# Patient Record
Sex: Male | Born: 1960 | Race: White | Hispanic: No | Smoking: Never smoker
Health system: Southern US, Community
[De-identification: ages and names within clinical notes are randomized; demographics above are authoritative.]

## PROBLEM LIST (undated history)

## (undated) DIAGNOSIS — K219 Gastro-esophageal reflux disease without esophagitis: Secondary | ICD-10-CM

## (undated) DIAGNOSIS — I2510 Atherosclerotic heart disease of native coronary artery without angina pectoris: Secondary | ICD-10-CM

## (undated) DIAGNOSIS — N182 Chronic kidney disease, stage 2 (mild): Secondary | ICD-10-CM

## (undated) DIAGNOSIS — R002 Palpitations: Secondary | ICD-10-CM

## (undated) DIAGNOSIS — G4700 Insomnia, unspecified: Secondary | ICD-10-CM

## (undated) DIAGNOSIS — A499 Bacterial infection, unspecified: Secondary | ICD-10-CM

## (undated) DIAGNOSIS — E1142 Type 2 diabetes mellitus with diabetic polyneuropathy: Secondary | ICD-10-CM

## (undated) DIAGNOSIS — I5042 Chronic combined systolic (congestive) and diastolic (congestive) heart failure: Secondary | ICD-10-CM

## (undated) DIAGNOSIS — E119 Type 2 diabetes mellitus without complications: Secondary | ICD-10-CM

## (undated) DIAGNOSIS — G7000 Myasthenia gravis without (acute) exacerbation: Secondary | ICD-10-CM

## (undated) DIAGNOSIS — R51 Headache: Secondary | ICD-10-CM

## (undated) DIAGNOSIS — H9192 Unspecified hearing loss, left ear: Secondary | ICD-10-CM

## (undated) DIAGNOSIS — I255 Ischemic cardiomyopathy: Secondary | ICD-10-CM

## (undated) DIAGNOSIS — D649 Anemia, unspecified: Secondary | ICD-10-CM

## (undated) DIAGNOSIS — H269 Unspecified cataract: Secondary | ICD-10-CM

## (undated) DIAGNOSIS — A6920 Lyme disease, unspecified: Secondary | ICD-10-CM

## (undated) DIAGNOSIS — T7840XA Allergy, unspecified, initial encounter: Secondary | ICD-10-CM

## (undated) DIAGNOSIS — R519 Headache, unspecified: Secondary | ICD-10-CM

## (undated) DIAGNOSIS — I10 Essential (primary) hypertension: Secondary | ICD-10-CM

## (undated) DIAGNOSIS — Z974 Presence of external hearing-aid: Secondary | ICD-10-CM

## (undated) DIAGNOSIS — A419 Sepsis, unspecified organism: Secondary | ICD-10-CM

## (undated) DIAGNOSIS — J302 Other seasonal allergic rhinitis: Secondary | ICD-10-CM

## (undated) DIAGNOSIS — I219 Acute myocardial infarction, unspecified: Secondary | ICD-10-CM

## (undated) DIAGNOSIS — M1990 Unspecified osteoarthritis, unspecified site: Secondary | ICD-10-CM

## (undated) DIAGNOSIS — E0590 Thyrotoxicosis, unspecified without thyrotoxic crisis or storm: Secondary | ICD-10-CM

## (undated) DIAGNOSIS — J45909 Unspecified asthma, uncomplicated: Secondary | ICD-10-CM

## (undated) DIAGNOSIS — E785 Hyperlipidemia, unspecified: Secondary | ICD-10-CM

## (undated) DIAGNOSIS — G4730 Sleep apnea, unspecified: Secondary | ICD-10-CM

## (undated) DIAGNOSIS — R509 Fever, unspecified: Secondary | ICD-10-CM

## (undated) HISTORY — DX: Type 2 diabetes mellitus with diabetic polyneuropathy: E11.42

## (undated) HISTORY — DX: Palpitations: R00.2

## (undated) HISTORY — DX: Unspecified osteoarthritis, unspecified site: M19.90

## (undated) HISTORY — DX: Unspecified cataract: H26.9

## (undated) HISTORY — DX: Bacterial infection, unspecified: A49.9

## (undated) HISTORY — DX: Unspecified asthma, uncomplicated: J45.909

## (undated) HISTORY — PX: TUNNELED VENOUS CATHETER PLACEMENT: SHX818

## (undated) HISTORY — DX: Type 2 diabetes mellitus without complications: E11.9

## (undated) HISTORY — DX: Lyme disease, unspecified: A69.20

## (undated) HISTORY — DX: Atherosclerotic heart disease of native coronary artery without angina pectoris: I25.10

## (undated) HISTORY — DX: Allergy, unspecified, initial encounter: T78.40XA

## (undated) HISTORY — PX: TONSILLECTOMY AND ADENOIDECTOMY: SUR1326

## (undated) HISTORY — DX: Hyperlipidemia, unspecified: E78.5

## (undated) HISTORY — DX: Myasthenia gravis without (acute) exacerbation: G70.00

## (undated) HISTORY — DX: Insomnia, unspecified: G47.00

## (undated) HISTORY — PX: BILATERAL CARPAL TUNNEL RELEASE: SHX6508

## (undated) HISTORY — DX: Anemia, unspecified: D64.9

## (undated) HISTORY — PX: CORONARY ANGIOPLASTY: SHX604

## (undated) HISTORY — DX: Chronic kidney disease, stage 2 (mild): N18.2

## (undated) HISTORY — DX: Gastro-esophageal reflux disease without esophagitis: K21.9

## (undated) HISTORY — PX: CARDIAC CATHETERIZATION: SHX172

## (undated) HISTORY — DX: Sepsis, unspecified organism: A41.9

## (undated) HISTORY — DX: Fever, unspecified: R50.9

## (undated) HISTORY — DX: Essential (primary) hypertension: I10

---

## 1992-06-05 DIAGNOSIS — A6920 Lyme disease, unspecified: Secondary | ICD-10-CM | POA: Insufficient documentation

## 2008-10-05 DIAGNOSIS — I219 Acute myocardial infarction, unspecified: Secondary | ICD-10-CM

## 2008-10-05 HISTORY — DX: Acute myocardial infarction, unspecified: I21.9

## 2010-10-05 HISTORY — PX: EYE SURGERY: SHX253

## 2015-08-22 ENCOUNTER — Encounter: Payer: Self-pay | Admitting: Family Medicine

## 2015-08-22 ENCOUNTER — Ambulatory Visit (INDEPENDENT_AMBULATORY_CARE_PROVIDER_SITE_OTHER): Payer: Medicare Other | Admitting: Family Medicine

## 2015-08-22 VITALS — BP 122/72 | HR 98 | Temp 98.1°F | Resp 18 | Ht 67.0 in | Wt 258.9 lb

## 2015-08-22 DIAGNOSIS — M545 Low back pain, unspecified: Secondary | ICD-10-CM | POA: Insufficient documentation

## 2015-08-22 DIAGNOSIS — E785 Hyperlipidemia, unspecified: Secondary | ICD-10-CM

## 2015-08-22 DIAGNOSIS — M1990 Unspecified osteoarthritis, unspecified site: Secondary | ICD-10-CM | POA: Diagnosis not present

## 2015-08-22 DIAGNOSIS — G7000 Myasthenia gravis without (acute) exacerbation: Secondary | ICD-10-CM | POA: Diagnosis not present

## 2015-08-22 DIAGNOSIS — M5442 Lumbago with sciatica, left side: Secondary | ICD-10-CM | POA: Diagnosis not present

## 2015-08-22 DIAGNOSIS — E118 Type 2 diabetes mellitus with unspecified complications: Secondary | ICD-10-CM

## 2015-08-22 DIAGNOSIS — G8929 Other chronic pain: Secondary | ICD-10-CM

## 2015-08-22 DIAGNOSIS — E1142 Type 2 diabetes mellitus with diabetic polyneuropathy: Secondary | ICD-10-CM | POA: Insufficient documentation

## 2015-08-22 DIAGNOSIS — E1165 Type 2 diabetes mellitus with hyperglycemia: Secondary | ICD-10-CM | POA: Diagnosis not present

## 2015-08-22 DIAGNOSIS — K219 Gastro-esophageal reflux disease without esophagitis: Secondary | ICD-10-CM | POA: Insufficient documentation

## 2015-08-22 DIAGNOSIS — J4550 Severe persistent asthma, uncomplicated: Secondary | ICD-10-CM

## 2015-08-22 DIAGNOSIS — I10 Essential (primary) hypertension: Secondary | ICD-10-CM | POA: Diagnosis not present

## 2015-08-22 DIAGNOSIS — IMO0002 Reserved for concepts with insufficient information to code with codable children: Secondary | ICD-10-CM | POA: Insufficient documentation

## 2015-08-22 DIAGNOSIS — A6920 Lyme disease, unspecified: Secondary | ICD-10-CM

## 2015-08-22 DIAGNOSIS — I2510 Atherosclerotic heart disease of native coronary artery without angina pectoris: Secondary | ICD-10-CM

## 2015-08-22 DIAGNOSIS — L309 Dermatitis, unspecified: Secondary | ICD-10-CM | POA: Diagnosis not present

## 2015-08-22 DIAGNOSIS — Z794 Long term (current) use of insulin: Secondary | ICD-10-CM

## 2015-08-22 DIAGNOSIS — G4700 Insomnia, unspecified: Secondary | ICD-10-CM

## 2015-08-22 DIAGNOSIS — J45909 Unspecified asthma, uncomplicated: Secondary | ICD-10-CM | POA: Insufficient documentation

## 2015-08-22 LAB — POCT GLYCOSYLATED HEMOGLOBIN (HGB A1C): HEMOGLOBIN A1C: 8.1

## 2015-08-22 LAB — GLUCOSE, POCT (MANUAL RESULT ENTRY): POC GLUCOSE: 99 mg/dL (ref 70–99)

## 2015-08-22 MED ORDER — BETAMETHASONE DIPROPIONATE AUG 0.05 % EX OINT: TOPICAL_OINTMENT | Freq: Two times a day (BID) | CUTANEOUS | Status: DC

## 2015-08-22 MED ORDER — HYDROCHLOROTHIAZIDE 25 MG PO TABS: 25.0000 mg | ORAL_TABLET | Freq: Every day | ORAL | Status: DC

## 2015-08-22 MED ORDER — GLUCOSE BLOOD VI STRP: 1.0000 | ORAL_STRIP | Status: DC | PRN

## 2015-08-22 NOTE — Progress Notes (Signed)
Name: [name]   MRN: [id number]    DOB: 1961/09/06   Date:08/22/2015       Progress Note  Subjective  Chief Complaint  Chief Complaint  Patient presents with  . Establish Care    NP  . Diabetes   Diabetes He presents for his follow-up diabetic visit. He has type 2 diabetes mellitus. His disease course has been stable. Hypoglycemia symptoms include dizziness. Pertinent negatives for hypoglycemia include no headaches. Associated symptoms include foot paresthesias and weakness. Pertinent negatives for diabetes include no blurred vision, no chest pain, no polydipsia and no polyuria. Diabetic complications include heart disease, impotence, peripheral neuropathy and retinopathy. Pertinent negatives for diabetic complications include no CVA. Current diabetic treatment includes oral agent (monotherapy) and intensive insulin program. He is currently taking insulin pre-breakfast, pre-lunch, at bedtime and pre-dinner. Insulin injections are given by patient. His weight is stable. He is following a generally healthy (Eats bread, working on improving his diet) diet. He monitors blood glucose at home 3-4 x per day. His breakfast blood glucose range is generally 110-130 mg/dl. An ACE inhibitor/angiotensin II receptor blocker is not being taken. Eye exam is current.  Hypertension This is a chronic problem. The problem is controlled. Associated symptoms include shortness of breath. Pertinent negatives include no anxiety, blurred vision, chest pain, headaches, orthopnea or palpitations. Past treatments include diuretics, calcium channel blockers and beta blockers. Hypertensive end-organ damage includes CAD/MI and retinopathy. There is no history of kidney disease or CVA.  Rash This is a chronic problem. The problem is unchanged. The affected locations include the left lower leg and right lowerleg. The rash is characterized by itchiness and scaling. Associated symptoms include joint pain and shortness of  breath. Past treatments include topical steroids. The treatment provided significant relief. His past medical history is significant for asthma.    Past Medical History  Diagnosis Date  . Asthma   . Diabetes mellitus without complication ([place])   . GERD (gastroesophageal reflux disease)   . Hyperlipidemia   . Hypertension   . Allergy     dust, seasonal (worse in the fall).  . Lyme disease     Chronic  . 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 Followed by Pain Specialist in [place]  . Cataract     First Dx in 2012  . Coronary artery disease     Has 10 stents.  . Myasthenia gravis ([place])   . Diabetic peripheral neuropathy ([place])   . Insomnia     Past Surgical History  Procedure Laterality Date  . Cardiac catheterization      Several Caths, most recent in  March 2016.  [name] Eye surgery Bilateral 2012    cataract  . Tonsillectomy and adenoidectomy      As a child  . Bilateral carpal tunnel release Bilateral L in 2012 and R in 2013    Family History  Problem Relation Age of Onset  . Diabetes Mother   . Heart disease Mother   . Cancer Father     Prostate CA    Social History   Social History  . Marital Status: Married    Spouse Name: N/A  . Number of Children: N/A  . Years of Education: N/A   Occupational History  . Not on file.   Social History Main Topics  . Smoking status: Never Smoker   . Smokeless tobacco: Never Used  . Alcohol Use: 0.0 oz/week    0 Standard drinks or equivalent per week  Comment: occasional  . Drug Use: No  . Sexual Activity: No   Other Topics Concern  . Not on file   Social History Narrative  . No narrative on file     Current outpatient prescriptions:  .  albuterol (PROVENTIL) (2.5 MG/3ML) 0.083% nebulizer solution, Take 2.5 mg by nebulization every 6 (six) hours as needed for wheezing or shortness of breath., Disp: , Rfl:  .  AMLODIPINE BESYLATE PO, Take 10 mg by mouth daily., Disp: , Rfl:  .  aspirin 81 MG tablet, Take 81 mg by mouth  daily., Disp: , Rfl:  .  augmented betamethasone dipropionate (DIPROLENE-AF) 0.05 % ointment, Apply topically 2 (two) times daily., Disp: , Rfl:  .  bisoprolol-hydrochlorothiazide (ZIAC) 2.5-6.25 MG tablet, Take 1 tablet by mouth daily., Disp: , Rfl:  .  cyclobenzaprine (FLEXERIL) 10 MG tablet, Take 10 mg by mouth 3 (three) times daily as needed for muscle spasms., Disp: , Rfl:  .  fluticasone (FLOVENT HFA) 110 MCG/ACT inhaler, Inhale 2 puffs into the lungs 2 (two) times daily., Disp: , Rfl:  .  gabapentin (NEURONTIN) 300 MG capsule, Take 300 mg by mouth 3 (three) times daily., Disp: , Rfl:  .  gabapentin (NEURONTIN) 800 MG tablet, Take 800 mg by mouth at bedtime., Disp: , Rfl:  .  glucose blood test strip, 1 each by Other route as needed for other. Use as instructed, Disp: , Rfl:  .  hydrochlorothiazide (HYDRODIURIL) 25 MG tablet, Take 25 mg by mouth daily., Disp: , Rfl:  .  insulin glargine (LANTUS) 100 UNIT/ML injection, Inject 42 Units into the skin at bedtime., Disp: , Rfl:  .  insulin lispro (HUMALOG KWIKPEN) 100 UNIT/ML KiwkPen, Inject 100 Units into the skin., Disp: , Rfl:  .  Loratadine (CLARITIN PO), Take by mouth., Disp: , Rfl:  .  METFORMIN HCL PO, Take 500 mg by mouth 2 (two) times daily., Disp: , Rfl:  .  mometasone-formoterol (DULERA) 100-5 MCG/ACT AERO, Inhale 2 puffs into the lungs 2 (two) times daily., Disp: , Rfl:  .  nitroGLYCERIN (NITROSTAT) 0.3 MG SL tablet, Place 0.3 mg under the tongue every 5 (five) minutes as needed for chest pain., Disp: , Rfl:  .  Oxycodone HCl 10 MG TABS, Take 10 mg by mouth 4 (four) times daily as needed., Disp: , Rfl:  .  pantoprazole (PROTONIX) 20 MG tablet, Take 20 mg by mouth daily., Disp: , Rfl:  .  pyridostigmine (MESTINON) 60 MG tablet, Take 60 mg by mouth 4 (four) times daily as needed., Disp: , Rfl:  .  rosuvastatin (CRESTOR) 10 MG tablet, Take 10 mg by mouth daily., Disp: , Rfl:  .  zolpidem (AMBIEN) 5 MG tablet, Take 10 mg by mouth at  bedtime as needed for sleep., Disp: , Rfl:   No Known Allergies   Review of Systems  Eyes: Negative for blurred vision.  Respiratory: Positive for shortness of breath.   Cardiovascular: Negative for chest pain, palpitations and orthopnea.  Genitourinary: Positive for impotence.  Musculoskeletal: Positive for back pain and joint pain.  Skin: Positive for rash.  Neurological: Positive for dizziness and weakness. Negative for headaches.  Endo/Heme/Allergies: Negative for polydipsia.   Objective  Filed Vitals:   08/22/15 1457  BP: 122/72  Pulse: 98  Temp: 98.1 F (36.7 C)  TempSrc: Oral  Resp: 18  Height: 5\' 7"  (1.702 m)  Weight: 258 lb 14.4 oz (117.436 kg)  SpO2: 95%    Physical Exam  Constitutional: He  is oriented to person, place, and time and well-developed, well-nourished, and in no distress.  HENT:  Head: Normocephalic and atraumatic.  Eyes: Pupils are equal, round, and reactive to light.  Pupils equal, round, but sluggishly reactive to light  Cardiovascular: S1 normal, S2 normal and normal heart sounds.   Sinus tachycardia.  Pulmonary/Chest: He has no decreased breath sounds. He has wheezes. He has no rhonchi. He has no rales.  Diffuse expiratory and inspiratory wheezing on auscultation, no crackles.  Abdominal: Soft. Bowel sounds are normal. There is no tenderness.  Neurological: He is alert and oriented to person, place, and time.  Nursing note and vitals reviewed.   Recent Results (from the past 2160 hour(s))  POCT Glucose (CBG)     Status: Normal   Collection Time: 08/22/15  3:27 PM  Result Value Ref Range   POC Glucose 99 70 - 99 mg/dl  POCT HgB A1C     Status: Abnormal   Collection Time: 08/22/15  3:33 PM  Result Value Ref Range   Hemoglobin A1C 8.1     Assessment & Plan 1. Coronary artery disease involving native coronary artery of native heart without angina pectoris  - Ambulatory referral to Cardiology  2. Uncontrolled type 2 diabetes mellitus  with complication, with long-term current use of insulin (HCC) A1c of 8.1%, consistent with poor control. Referral to endocrinology for further management of diabetes - POCT HgB A1C - POCT Glucose (CBG) - Ambulatory referral to Ophthalmology - Ambulatory referral to Endocrinology - glucose blood test strip; 1 each by Other route as needed for other. Use as instructed  Dispense: 360 each; Refill: 0  3. Myasthenia gravis ([place])  - Ambulatory referral to Neurology  4. Chronic left-sided low back pain with left-sided sciatica chronic low back pain, on oxycodone 10 mg 4 times daily.referral to pain clinic for continuation of therapy. - Ambulatory referral to Pain Clinic  5. Dermatitis  - augmented betamethasone dipropionate (DIPROLENE-AF) 0.05 % ointment; Apply topically 2 (two) times daily.  Dispense: 45 g; Refill: 0  6. Essential hypertension Stable and controlled. - hydrochlorothiazide (HYDRODIURIL) 25 MG tablet; Take 1 tablet (25 mg total) by mouth daily.  Dispense: 30 tablet; Refill: 0   Merrill Villarruel [facility] 08/22/2015 3:42 PM

## 2015-08-26 ENCOUNTER — Telehealth: Payer: Self-pay | Admitting: Family Medicine

## 2015-08-26 DIAGNOSIS — E1165 Type 2 diabetes mellitus with hyperglycemia: Secondary | ICD-10-CM

## 2015-08-26 DIAGNOSIS — E118 Type 2 diabetes mellitus with unspecified complications: Principal | ICD-10-CM

## 2015-08-26 DIAGNOSIS — Z794 Long term (current) use of insulin: Principal | ICD-10-CM

## 2015-08-26 DIAGNOSIS — IMO0002 Reserved for concepts with insufficient information to code with codable children: Secondary | ICD-10-CM

## 2015-08-26 NOTE — Telephone Encounter (Signed)
Patient test scripts where called in however the pharmacy is needing the type meter that he uses. The patient called and stated that he uses [employer] Next. He also states that he only have 1.5 days left of test scripts. Please send to [facility]. He is also requesting a return call once complete.

## 2015-08-27 ENCOUNTER — Telehealth: Payer: Self-pay | Admitting: Family Medicine

## 2015-08-27 MED ORDER — GLUCOSE BLOOD VI STRP: ORAL_STRIP | Status: DC

## 2015-08-27 MED ORDER — GLUCOSE BLOOD VI STRP: 1.0000 | ORAL_STRIP | Status: DC | PRN

## 2015-08-27 NOTE — Telephone Encounter (Signed)
Test strips have been sent to [place]

## 2015-08-27 NOTE — Telephone Encounter (Signed)
Pt needs test strips sent to [facility]. Pt states pharmacy needs the name of the test strips he needs for his machine. He states this needs to be done as soon as possible for he will be out by Wednesday afternoon. The type of meter he has [employer] Cont[employer] Next strips.Medicare will not fill it without a name.

## 2015-08-27 NOTE — Telephone Encounter (Signed)
Test Strips have been refilled and sent to [place]

## 2015-08-28 ENCOUNTER — Telehealth: Payer: Self-pay

## 2015-08-28 MED ORDER — GLUCOSE BLOOD VI STRP: ORAL_STRIP | Status: DC

## 2015-08-28 NOTE — Telephone Encounter (Signed)
Medication has been refilled as directed by the Pharmacy with the proper Diagnosis codes.

## 2015-09-02 ENCOUNTER — Telehealth: Payer: Self-pay | Admitting: Family Medicine

## 2015-09-02 NOTE — Telephone Encounter (Signed)
Patient is requesting a refill on Metformin 500 mg 2 times daily and Pantoprazole Sodium 20mg  1 tablet 3 times daily. Please send to [facility] Also, stated that the Contour test strips is costing $122.00. In order for it to be less expensive it has to be written on the prescription that he is uncontrolled diabetic. Please call wife Neoma Laming once completed [phone]

## 2015-09-03 MED ORDER — PANTOPRAZOLE SODIUM 20 MG PO TBEC: 20.0000 mg | DELAYED_RELEASE_TABLET | Freq: Every day | ORAL | Status: DC

## 2015-09-03 MED ORDER — METFORMIN HCL 500 MG PO TABS: 500.0000 mg | ORAL_TABLET | Freq: Two times a day (BID) | ORAL | Status: DC

## 2015-09-03 MED ORDER — GLUCOSE BLOOD VI STRP: ORAL_STRIP | Status: DC

## 2015-09-03 NOTE — Telephone Encounter (Signed)
Medication has been refilled and sent to [facility] [address] 

## 2015-09-04 ENCOUNTER — Telehealth: Payer: Self-pay | Admitting: Family Medicine

## 2015-09-04 NOTE — Telephone Encounter (Signed)
Routed to Dr. Manuella Ghazi for medication dosage change

## 2015-09-04 NOTE — Telephone Encounter (Signed)
Pt would like a call back about one of his meds. Pt is at home and its ok to call that #.

## 2015-09-04 NOTE — Telephone Encounter (Signed)
Patient normally takes Protonix 20 mg 3 times daily. In his medication list, Protonix is listed as 20 mg once daily. He is going to bring the medication bottle on Friday, December 2 for [place] to review and change the prescription.

## 2015-09-18 ENCOUNTER — Other Ambulatory Visit: Payer: Self-pay

## 2015-09-18 ENCOUNTER — Ambulatory Visit: Payer: Medicare Other | Admitting: Family Medicine

## 2015-09-18 MED ORDER — PANTOPRAZOLE SODIUM 40 MG PO TBEC: 40.0000 mg | DELAYED_RELEASE_TABLET | Freq: Two times a day (BID) | ORAL | Status: DC

## 2015-09-18 NOTE — Telephone Encounter (Signed)
Medication has been refilled and sent to [facility] on [address].

## 2015-09-25 ENCOUNTER — Telehealth: Payer: Self-pay | Admitting: Family Medicine

## 2015-09-25 MED ORDER — GABAPENTIN 800 MG PO TABS: 800.0000 mg | ORAL_TABLET | Freq: Every day | ORAL | Status: DC

## 2015-09-25 MED ORDER — GABAPENTIN 300 MG PO CAPS: 300.0000 mg | ORAL_CAPSULE | Freq: Three times a day (TID) | ORAL | Status: AC

## 2015-09-25 NOTE — Telephone Encounter (Signed)
Medication has been refilled and sent to [facility] [address] 

## 2015-09-25 NOTE — Telephone Encounter (Signed)
Pt would like a refill on Gabapentin 2 different doses, 300 mg capsule 1 tab 3 times a day, 800 mg 1 tab at bedtime. [place]

## 2015-10-08 ENCOUNTER — Encounter: Payer: Self-pay | Admitting: Family Medicine

## 2015-10-08 ENCOUNTER — Ambulatory Visit (INDEPENDENT_AMBULATORY_CARE_PROVIDER_SITE_OTHER): Payer: Medicare Other | Admitting: Family Medicine

## 2015-10-08 VITALS — BP 121/80 | HR 106 | Temp 98.1°F | Resp 19 | Ht 67.0 in | Wt 257.9 lb

## 2015-10-08 DIAGNOSIS — G4700 Insomnia, unspecified: Secondary | ICD-10-CM | POA: Diagnosis not present

## 2015-10-08 DIAGNOSIS — K219 Gastro-esophageal reflux disease without esophagitis: Secondary | ICD-10-CM | POA: Diagnosis not present

## 2015-10-08 DIAGNOSIS — J45901 Unspecified asthma with (acute) exacerbation: Secondary | ICD-10-CM | POA: Insufficient documentation

## 2015-10-08 DIAGNOSIS — I10 Essential (primary) hypertension: Secondary | ICD-10-CM | POA: Diagnosis not present

## 2015-10-08 DIAGNOSIS — J4541 Moderate persistent asthma with (acute) exacerbation: Secondary | ICD-10-CM

## 2015-10-08 MED ORDER — AMLODIPINE BESYLATE 5 MG PO TABS: 5.0000 mg | ORAL_TABLET | Freq: Every day | ORAL | Status: DC

## 2015-10-08 MED ORDER — AZITHROMYCIN 250 MG PO TABS: ORAL_TABLET | ORAL | Status: DC

## 2015-10-08 MED ORDER — ZOLPIDEM TARTRATE 5 MG PO TABS: 10.0000 mg | ORAL_TABLET | Freq: Every evening | ORAL | Status: DC | PRN

## 2015-10-08 MED ORDER — PREDNISONE 10 MG PO TABS: 10.0000 mg | ORAL_TABLET | Freq: Every day | ORAL | Status: DC

## 2015-10-08 MED ORDER — PANTOPRAZOLE SODIUM 40 MG PO TBEC: 40.0000 mg | DELAYED_RELEASE_TABLET | Freq: Two times a day (BID) | ORAL | Status: DC

## 2015-10-08 NOTE — Progress Notes (Signed)
Name: [name]   MRN: [id number]    DOB: May 21, 1961   Date:10/08/2015       Progress Note  Subjective  Chief Complaint  Chief Complaint  Patient presents with  . Follow-up    1 mo  . Diabetes  . Hypertension  . Hyperlipidemia  . Asthma  . Medication Refill    amlodpine 5 mg / pantoprazole 40 mg / zolpidem 5 mg    Hypertension This is a chronic problem. The problem is unchanged. The problem is controlled. Associated symptoms include headaches (migraine headaches). Pertinent negatives include no blurred vision, chest pain or palpitations. Past treatments include calcium channel blockers, diuretics and beta blockers. Hypertensive end-organ damage includes CAD/MI (CAD w/ stents, MI). There is no history of kidney disease or CVA.  Insomnia Primary symptoms: sleep disturbance.  The symptoms are aggravated by pain (neuropathy in feet). Past treatments include medication. Typical bedtime:  10-11 P.M..  How long after going to bed to you fall asleep: 15-30 minutes.    Heartburn He complains of dysphagia (most likely from Myasthenia Gravis), heartburn and nausea. He reports no abdominal pain, no belching, no chest pain, no coughing or no sore throat. This is a chronic problem. Risk factors include obesity. He has tried a PPI for the symptoms. Past procedures do not include an EGD.  Cough This is a new problem. The cough is productive of sputum. Associated symptoms include ear pain, headaches (migraine headaches) and heartburn. Pertinent negatives include no chest pain, chills, ear congestion, fever or sore throat. He has tried a beta-agonist inhaler for the symptoms. His past medical history is significant for asthma and bronchitis.    Past Medical History  Diagnosis Date  . Asthma   . Diabetes mellitus without complication ([place])   . GERD (gastroesophageal reflux disease)   . Hyperlipidemia   . Hypertension   . Allergy     dust, seasonal (worse in the fall).  . Lyme disease    Chronic  . 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 Followed by Pain Specialist in [place]  . Cataract     First Dx in 2012  . Coronary artery disease     Has 10 stents.  . Myasthenia gravis ([place])   . Diabetic peripheral neuropathy ([place])   . Insomnia     Past Surgical History  Procedure Laterality Date  . Cardiac catheterization      Several Caths, most recent in  March 2016.  [name] Eye surgery Bilateral 2012    cataract  . Tonsillectomy and adenoidectomy      As a child  . Bilateral carpal tunnel release Bilateral L in 2012 and R in 2013    Family History  Problem Relation Age of Onset  . Diabetes Mother   . Heart disease Mother   . Cancer Father     Prostate CA    Social History   Social History  . Marital Status: Married    Spouse Name: N/A  . Number of Children: N/A  . Years of Education: N/A   Occupational History  . Not on file.   Social History Main Topics  . Smoking status: Never Smoker   . Smokeless tobacco: Never Used  . Alcohol Use: 0.0 oz/week    0 Standard drinks or equivalent per week     Comment: occasional  . Drug Use: No  . Sexual Activity: No   Other Topics Concern  . Not on file   Social History Narrative  Current outpatient prescriptions:  .  albuterol (PROVENTIL) (2.5 MG/3ML) 0.083% nebulizer solution, Take 2.5 mg by nebulization every 6 (six) hours as needed for wheezing or shortness of breath., Disp: , Rfl:  .  amLODipine (NORVASC) 5 MG tablet, Take 1 tablet (5 mg total) by mouth daily., Disp: 30 tablet, Rfl: 2 .  aspirin 81 MG tablet, Take 81 mg by mouth daily., Disp: , Rfl:  .  augmented betamethasone dipropionate (DIPROLENE-AF) 0.05 % ointment, Apply topically 2 (two) times daily., Disp: 45 g, Rfl: 0 .  bisoprolol-hydrochlorothiazide (ZIAC) 2.5-6.25 MG tablet, Take 1 tablet by mouth daily., Disp: , Rfl:  .  cyclobenzaprine (FLEXERIL) 10 MG tablet, Take 10 mg by mouth 3 (three) times daily as needed for muscle spasms., Disp: , Rfl:  .   fluticasone (FLOVENT HFA) 110 MCG/ACT inhaler, Inhale 2 puffs into the lungs 2 (two) times daily., Disp: , Rfl:  .  gabapentin (NEURONTIN) 300 MG capsule, Take 1 capsule (300 mg total) by mouth 3 (three) times daily., Disp: 90 capsule, Rfl: 1 .  gabapentin (NEURONTIN) 800 MG tablet, Take 1 tablet (800 mg total) by mouth at bedtime., Disp: 90 tablet, Rfl: 0 .  glucose blood (BAYER CONTOUR TEST) test strip, Use as instructed, Disp: 100 each, Rfl: 2 .  hydrochlorothiazide (HYDRODIURIL) 25 MG tablet, Take 1 tablet (25 mg total) by mouth daily., Disp: 30 tablet, Rfl: 0 .  insulin glargine (LANTUS) 100 UNIT/ML injection, Inject 42 Units into the skin at bedtime., Disp: , Rfl:  .  insulin lispro (HUMALOG KWIKPEN) 100 UNIT/ML KiwkPen, Inject 8 Units into the skin 3 (three) times daily. , Disp: , Rfl:  .  Loratadine (CLARITIN PO), Take by mouth., Disp: , Rfl:  .  metFORMIN (GLUCOPHAGE) 500 MG tablet, Take 1 tablet (500 mg total) by mouth 2 (two) times daily., Disp: 60 tablet, Rfl: 2 .  mometasone-formoterol (DULERA) 100-5 MCG/ACT AERO, Inhale 2 puffs into the lungs 2 (two) times daily., Disp: , Rfl:  .  nitroGLYCERIN (NITROSTAT) 0.3 MG SL tablet, Place 0.3 mg under the tongue every 5 (five) minutes as needed for chest pain., Disp: , Rfl:  .  Oxycodone HCl 10 MG TABS, Take 10 mg by mouth 4 (four) times daily as needed., Disp: , Rfl:  .  pantoprazole (PROTONIX) 40 MG tablet, Take 1 tablet (40 mg total) by mouth 2 (two) times daily., Disp: 60 tablet, Rfl: 2 .  pyridostigmine (MESTINON) 60 MG tablet, Take 60 mg by mouth 4 (four) times daily as needed., Disp: , Rfl:  .  rosuvastatin (CRESTOR) 10 MG tablet, Take 10 mg by mouth daily., Disp: , Rfl:  .  zolpidem (AMBIEN) 5 MG tablet, Take 2 tablets (10 mg total) by mouth at bedtime as needed for sleep., Disp: 30 tablet, Rfl: 2 .  azithromycin (ZITHROMAX) 250 MG tablet, 2 tabs po x day 1, then 1 tab po q day x 4 days, Disp: 6 tablet, Rfl: 0 .  predniSONE (DELTASONE)  10 MG tablet, Take 1 tablet (10 mg total) by mouth daily with breakfast., Disp: 15 tablet, Rfl: 0  Allergies  Allergen Reactions  . Insulin Aspart Hives     Review of Systems  Constitutional: Negative for fever and chills.  HENT: Positive for ear pain. Negative for sore throat.   Eyes: Negative for blurred vision.  Respiratory: Negative for cough.   Cardiovascular: Negative for chest pain and palpitations.  Gastrointestinal: Positive for heartburn, dysphagia (most likely from Myasthenia Gravis) and nausea. Negative for  abdominal pain.  Neurological: Positive for headaches (migraine headaches).  Psychiatric/Behavioral: Positive for sleep disturbance. The patient has insomnia.     Objective  Filed Vitals:   10/08/15 1341  BP: 121/80  Pulse: 106  Temp: 98.1 F (36.7 C)  TempSrc: Oral  Resp: 19  Height: 5\' 7"  (1.702 m)  Weight: 257 lb 14.4 oz (116.983 kg)  SpO2: 93%    Physical Exam  Constitutional: He is well-developed, well-nourished, and in no distress.  HENT:  Head: Normocephalic and atraumatic.  Cardiovascular: Normal rate and regular rhythm.   No murmur heard. Pulmonary/Chest: Effort normal. No respiratory distress. He has wheezes in the right middle field and the right lower field. He has no rhonchi. He has no rales.  Nursing note and vitals reviewed.    Assessment & Plan  1. Essential hypertension  - amLODipine (NORVASC) 5 MG tablet; Take 1 tablet (5 mg total) by mouth daily.  Dispense: 30 tablet; Refill: 2  2. Insomnia  - zolpidem (AMBIEN) 5 MG tablet; Take 2 tablets (10 mg total) by mouth at bedtime as needed for sleep.  Dispense: 30 tablet; Refill: 2  3. Gastroesophageal reflux disease, esophagitis presence not specified Referral to gastroenterology for possible endoscopy. - pantoprazole (PROTONIX) 40 MG tablet; Take 1 tablet (40 mg total) by mouth 2 (two) times daily.  Dispense: 60 tablet; Refill: 2 - Ambulatory referral to Gastroenterology  4.  Asthma with acute exacerbation, moderate persistent We will start on antibiotic and prednisone taper for acute asthma exacerbation. Patient advised to continue to use his inhaler. Advised to also check his BG while on prednisone therapy. - azithromycin (ZITHROMAX) 250 MG tablet; 2 tabs po x day 1, then 1 tab po q day x 4 days  Dispense: 6 tablet; Refill: 0 - predniSONE (DELTASONE) 10 MG tablet; Take 1 tablet (10 mg total) by mouth daily with breakfast.  Dispense: 15 tablet; Refill: 0   Zyden Suman [facility] 10/08/2015 8:15 PM

## 2015-10-16 ENCOUNTER — Telehealth: Payer: Self-pay | Admitting: Family Medicine

## 2015-10-16 NOTE — Telephone Encounter (Signed)
Pts wife would like a call back about her husband.

## 2015-10-16 NOTE — Telephone Encounter (Signed)
Routed to Dr. Manuella Ghazi for advice and call back

## 2015-10-16 NOTE — Telephone Encounter (Signed)
Per pt.'s wife he (the patient) is sick with cough, not improved since his last OV when was started on antibiotics and steroids. Their furnace broke and they have been in the cold which may have exacerbated his symptoms since he has asthma. Advised to take pt. To the Urgent care for evaluation including a CXR to rule out pneumonia. Verbalized agreement.

## 2015-10-17 ENCOUNTER — Encounter: Payer: Self-pay | Admitting: Anesthesiology

## 2015-10-17 ENCOUNTER — Ambulatory Visit: Payer: Medicare Other | Attending: Anesthesiology | Admitting: Anesthesiology

## 2015-10-17 ENCOUNTER — Other Ambulatory Visit: Payer: Self-pay | Admitting: Anesthesiology

## 2015-10-17 VITALS — BP 95/46 | HR 99 | Temp 97.6°F | Resp 15

## 2015-10-17 DIAGNOSIS — M47817 Spondylosis without myelopathy or radiculopathy, lumbosacral region: Secondary | ICD-10-CM

## 2015-10-17 DIAGNOSIS — Z87891 Personal history of nicotine dependence: Secondary | ICD-10-CM | POA: Diagnosis not present

## 2015-10-17 DIAGNOSIS — M4690 Unspecified inflammatory spondylopathy, site unspecified: Secondary | ICD-10-CM | POA: Diagnosis not present

## 2015-10-17 DIAGNOSIS — M5136 Other intervertebral disc degeneration, lumbar region: Secondary | ICD-10-CM

## 2015-10-17 DIAGNOSIS — E119 Type 2 diabetes mellitus without complications: Secondary | ICD-10-CM | POA: Diagnosis not present

## 2015-10-17 DIAGNOSIS — I2510 Atherosclerotic heart disease of native coronary artery without angina pectoris: Secondary | ICD-10-CM | POA: Insufficient documentation

## 2015-10-17 DIAGNOSIS — A6923 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: Secondary | ICD-10-CM | POA: Insufficient documentation

## 2015-10-17 DIAGNOSIS — M5030 Other cervical disc degeneration, unspecified cervical region: Secondary | ICD-10-CM | POA: Insufficient documentation

## 2015-10-17 DIAGNOSIS — M545 Low back pain: Secondary | ICD-10-CM | POA: Diagnosis not present

## 2015-10-17 DIAGNOSIS — G7000 Myasthenia gravis without (acute) exacerbation: Secondary | ICD-10-CM | POA: Diagnosis not present

## 2015-10-17 DIAGNOSIS — J45909 Unspecified asthma, uncomplicated: Secondary | ICD-10-CM | POA: Diagnosis not present

## 2015-10-17 DIAGNOSIS — M542 Cervicalgia: Secondary | ICD-10-CM | POA: Diagnosis not present

## 2015-10-17 DIAGNOSIS — K219 Gastro-esophageal reflux disease without esophagitis: Secondary | ICD-10-CM | POA: Insufficient documentation

## 2015-10-17 DIAGNOSIS — F329 Major depressive disorder, single episode, unspecified: Secondary | ICD-10-CM | POA: Diagnosis not present

## 2015-10-17 DIAGNOSIS — M549 Dorsalgia, unspecified: Secondary | ICD-10-CM | POA: Diagnosis present

## 2015-10-17 DIAGNOSIS — I10 Essential (primary) hypertension: Secondary | ICD-10-CM | POA: Insufficient documentation

## 2015-10-17 DIAGNOSIS — E785 Hyperlipidemia, unspecified: Secondary | ICD-10-CM | POA: Diagnosis not present

## 2015-10-17 NOTE — Patient Instructions (Signed)

## 2015-10-17 NOTE — Progress Notes (Signed)
Safety precautions to be maintained throughout the outpatient stay will include: orient to surroundings, keep bed in low position, maintain call bell within reach at all times, provide assistance with transfer out of bed and ambulation.  

## 2015-10-19 ENCOUNTER — Encounter: Payer: Self-pay | Admitting: Anesthesiology

## 2015-10-19 NOTE — Progress Notes (Signed)
Subjective:  Patient ID: [name], male    DOB: 10-30-1960  Age: 55 y.o. MRN: [id number]  CC: Back Pain   HPI [name] presents for new patient evaluation. He presents today with complaints of chronic low back pain and diffuse body pain. He states that he has had Lyme disease and has been treated by a clinic in [place] for pain management. He and his wife have recently moved here and our attempting to establish pain management in this area. He is describing a pain that starts in his left lower back with radiation in the left buttocks and down the left posterior lateral leg. He has a history of low back pain with degenerative joint disease and cervical degenerative joint disease with arthritis. At present his primary pain complaint is his left lower back pain and in the past he has received facet blocks for this and describes a radiofrequency ablation as well for his facet pain. At present his pain is up to a maximum of 10 at best 4 and now averages about an 8. It is worse in the afternoon and at night and with activity with aggravating factors including bending climbing lifting motion sitting. Twisting also makes this worse. He's had hypnosis TENS unit application and medication management with have helped. The pain is worse at night with daytime and nighttime cramping associated depression and weakness at times pain that wakes him up at night and does not let him sleep with associated numbness and tingling and spasming in the left posterior lateral leg and back.  He also has a history of myasthenia gravis which is currently followed by Dr. Melrose Nakayama. He was also followed by Dr. Maree Erie in [place] for his pain management.  He's had previous MRI and other nerve blocks x-rays nerve conduction studies and a previous neurologic evaluation and this has been requested from his pain management clinic in [place].  History [name] has a past medical history of Asthma; Diabetes mellitus  without complication ([place]); GERD (gastroesophageal reflux disease); Hyperlipidemia; Hypertension; Allergy; Lyme disease; Arthritis; Cataract; Coronary artery disease; Myasthenia gravis ([place]); Diabetic peripheral neuropathy ([place]); and Insomnia.   He has past surgical history that includes Cardiac catheterization; Eye surgery (Bilateral, 2012); Tonsillectomy and adenoidectomy; and Bilateral carpal tunnel release (Bilateral, L in 2012 and R in 2013).   His family history includes Cancer in his father; Diabetes in his mother; Heart disease in his mother.He reports that he has quit smoking. He has never used smokeless tobacco. He reports that he drinks alcohol. He reports that he does not use illicit drugs.   ---------------------------------------------------------------------------------------------------------------------- Past Medical History  Diagnosis Date  . Asthma   . Diabetes mellitus without complication ([place])   . GERD (gastroesophageal reflux disease)   . Hyperlipidemia   . Hypertension   . Allergy     dust, seasonal (worse in the fall).  . Lyme disease     Chronic  . 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 Followed by Pain Specialist in [place]  . Cataract     First Dx in 2012  . Coronary artery disease     Has 10 stents.  . Myasthenia gravis ([place])   . Diabetic peripheral neuropathy ([place])   . Insomnia     Past Surgical History  Procedure Laterality Date  . Cardiac catheterization      Several Caths, most recent in  March 2016.  [name] Eye surgery Bilateral 2012    cataract  . Tonsillectomy and adenoidectomy  As a child  . Bilateral carpal tunnel release Bilateral L in 2012 and R in 2013    Family History  Problem Relation Age of Onset  . Diabetes Mother   . Heart disease Mother   . Cancer Father     Prostate CA    Social History  Substance Use Topics  . Smoking status: Former Research scientist (life sciences)  . Smokeless tobacco: Never Used  . Alcohol Use: 0.0 oz/week    0 Standard drinks or  equivalent per week     Comment: occasional    ---------------------------------------------------------------------------------------------------------------------- Social History   Social History  . Marital Status: Married    Spouse Name: N/A  . Number of Children: N/A  . Years of Education: N/A   Social History Main Topics  . Smoking status: Former Research scientist (life sciences)  . Smokeless tobacco: Never Used  . Alcohol Use: 0.0 oz/week    0 Standard drinks or equivalent per week     Comment: occasional  . Drug Use: No  . Sexual Activity: No   Other Topics Concern  . Not on file   Social History Narrative      ----------------------------------------------------------------------------------------------------------------------  ROS Review of Systems   Cardiac is positive for high blood pressure chest pain previous heart attack status post stent Pulmonary positive for history of asthma and sleep apnea Neurologic is positive for Lyme disease myasthenia gravis and peripheral neuropathy Psychological is positive for depression Gastrointestinal positive for reflux Endocrine is positive for diabetes   Objective:  BP 95/46 mmHg  Pulse 99  Temp(Src) 97.6 F (36.4 C) (Oral)  Resp 15  SpO2 96%  Physical Exam Patient is alert oriented cooperative compliant and a good historian Heart is regular rate and rhythm without murmur Lungs show extremity wheezing worse on the right than left Inspection of his low back reveals some significant paraspinous muscle tenderness but no overt trigger points. He does have pain with extension in the standing position. This is worse with left lateral rotation as compared with  right lateral rotation. With the patient in the supine position he does have a positive straight leg raise on the left side and negative on the right and his muscle tone and bulk is good. He does have some mild weakness of 5 minus over 5 with flexion extension at the bilateral ankles. She  appears to be grossly intact.    Assessment & Plan:   Eziel was seen today for back pain.  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 Lyme arthritis of multiple joints ([facility]) We will allow him to continue on his oxycodone with initiation of this at his next evaluation. States that this is working well for him in the past and kept his pain under good control. He's been compliant on his regimen and denies diverting or illicit use. We have requested his previous information from his  pain management Center in [place].  2. DDD (degenerative disc disease), lumbar   3. Facet arthritis of lumbosacral region We'll schedule him for a facet injection the next 2-3 weeks  4. Myasthenia gravis ([place]) Continue follow-up with Dr. Melrose Nakayama  5. Cervicalgia I discussed some options with him regarding stretching strengthening exercises for his neck pain. This seems to be stable in nature with no complaints of numbness or tingling affecting the upper extremities no loss of strength noted per patient. If symptoms change we will consider a neurosurgical evaluation and further studies if  indicated.    ----------------------------------------------------------------------------------------------------------------------  I am having Mr. Postlethwait maintain his insulin glargine, insulin lispro, pyridostigmine, aspirin, bisoprolol-hydrochlorothiazide, rosuvastatin, nitroGLYCERIN, Loratadine (CLARITIN PO), albuterol, mometasone-formoterol, Oxycodone HCl, fluticasone, cyclobenzaprine, hydrochlorothiazide, augmented betamethasone dipropionate, glucose blood, metFORMIN, gabapentin, gabapentin, amLODipine, pantoprazole, zolpidem, azithromycin, and predniSONE.   No orders of the defined types were placed in this encounter.       Follow-up: Return in about 2 weeks (around 10/31/2015) for procedure facet.    Molli Barrows, MD

## 2015-10-22 ENCOUNTER — Telehealth: Payer: Self-pay

## 2015-10-22 ENCOUNTER — Encounter: Payer: Self-pay | Admitting: Cardiovascular Disease

## 2015-10-22 ENCOUNTER — Ambulatory Visit (INDEPENDENT_AMBULATORY_CARE_PROVIDER_SITE_OTHER): Payer: Medicare Other | Admitting: Cardiovascular Disease

## 2015-10-22 VITALS — BP 126/70 | HR 98 | Ht 67.0 in | Wt 255.0 lb

## 2015-10-22 DIAGNOSIS — R0602 Shortness of breath: Secondary | ICD-10-CM

## 2015-10-22 DIAGNOSIS — I10 Essential (primary) hypertension: Secondary | ICD-10-CM | POA: Diagnosis not present

## 2015-10-22 DIAGNOSIS — I25119 Atherosclerotic heart disease of native coronary artery with unspecified angina pectoris: Secondary | ICD-10-CM

## 2015-10-22 DIAGNOSIS — E785 Hyperlipidemia, unspecified: Secondary | ICD-10-CM

## 2015-10-22 NOTE — Progress Notes (Signed)
Primary care physician: Dr. Keith Rake   HPI  This is a pleasant 55 year old male who is here today to establish cardiovascular care.he moved from [place] and previously lived in [place]. He has extensive cardiac history. He reports history of coronary artery disease with a total of 10 stents placed starting in 2009 after he presented with myocardial infarction. Most recent cardiac catheterization according to him was in March 2016 which showed no obstructive disease and he was treated medically. His most recent cardiologist was Dr. Graylon Good. He has other chronic medical conditions that include diabetes, hypertension, hyperlipidemia, generalized pain due to reported Lyme disease and obesity. He reports chronic chest pain which is currently repair and does not happen very often. He reports significant exertional dyspnea with no orthopnea or PND. He is not aware of history of congestive heart failure and doesn't know if he had cardiomyopathy or not. He had recent symptoms of upper respiratory tract infection.  Allergies  Allergen Reactions  . Insulin Aspart Hives     Current Outpatient Prescriptions on File Prior to Visit  Medication Sig Dispense Refill  . albuterol (PROVENTIL) (2.5 MG/3ML) 0.083% nebulizer solution Take 2.5 mg by nebulization every 6 (six) hours as needed for wheezing or shortness of breath.    [name] amLODipine (NORVASC) 5 MG tablet Take 1 tablet (5 mg total) by mouth daily. 30 tablet 2  . aspirin 81 MG tablet Take 81 mg by mouth daily.    [name] augmented betamethasone dipropionate (DIPROLENE-AF) 0.05 % ointment Apply topically 2 (two) times daily. 45 g 0  . bisoprolol-hydrochlorothiazide (ZIAC) 2.5-6.25 MG tablet Take 1 tablet by mouth daily.    . cyclobenzaprine (FLEXERIL) 10 MG tablet Take 10 mg by mouth 3 (three) times daily as needed for muscle spasms.    . fluticasone (FLOVENT HFA) 110 MCG/ACT inhaler Inhale 2 puffs into the lungs 2 (two) times daily.    [name] gabapentin  (NEURONTIN) 300 MG capsule Take 1 capsule (300 mg total) by mouth 3 (three) times daily. 90 capsule 1  . gabapentin (NEURONTIN) 800 MG tablet Take 1 tablet (800 mg total) by mouth at bedtime. 90 tablet 0  . glucose blood (BAYER CONTOUR TEST) test strip Use as instructed 100 each 2  . hydrochlorothiazide (HYDRODIURIL) 25 MG tablet Take 1 tablet (25 mg total) by mouth daily. 30 tablet 0  . insulin glargine (LANTUS) 100 UNIT/ML injection Inject 42 Units into the skin at bedtime.    . insulin lispro (HUMALOG KWIKPEN) 100 UNIT/ML KiwkPen Inject 8 Units into the skin 3 (three) times daily.     . Loratadine (CLARITIN PO) Take by mouth.    . mometasone-formoterol (DULERA) 100-5 MCG/ACT AERO Inhale 2 puffs into the lungs 2 (two) times daily.    . nitroGLYCERIN (NITROSTAT) 0.3 MG SL tablet Place 0.3 mg under the tongue every 5 (five) minutes as needed for chest pain.    . Oxycodone HCl 10 MG TABS Take 10 mg by mouth 4 (four) times daily as needed.    . pantoprazole (PROTONIX) 40 MG tablet Take 1 tablet (40 mg total) by mouth 2 (two) times daily. 60 tablet 2  . pyridostigmine (MESTINON) 60 MG tablet Take 60 mg by mouth 4 (four) times daily as needed.    . zolpidem (AMBIEN) 5 MG tablet Take 2 tablets (10 mg total) by mouth at bedtime as needed for sleep. 30 tablet 2   No current facility-administered medications on file prior to visit.     Past  Medical History  Diagnosis Date  . Asthma   . Diabetes mellitus without complication ([place])   . GERD (gastroesophageal reflux disease)   . Hyperlipidemia   . Hypertension   . Allergy     dust, seasonal (worse in the fall).  . Lyme disease     Chronic  . 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 Followed by Pain Specialist in [place]  . Cataract     First Dx in 2012  . Myasthenia gravis ([place])   . Diabetic peripheral neuropathy ([place])   . Insomnia   . Coronary artery disease     Has 10 stents.     Past Surgical History  Procedure Laterality Date  . Cardiac  catheterization      Several Caths, most recent in  March 2016.  [name] Eye surgery Bilateral 2012    cataract  . Tonsillectomy and adenoidectomy      As a child  . Bilateral carpal tunnel release Bilateral L in 2012 and R in 2013  . Coronary angioplasty       Family History  Problem Relation Age of Onset  . Diabetes Mother   . Heart disease Mother   . Cancer Father     Prostate CA     Social History   Social History  . Marital Status: Married    Spouse Name: N/A  . Number of Children: N/A  . Years of Education: N/A   Occupational History  . Not on file.   Social History Main Topics  . Smoking status: Former Research scientist (life sciences)  . Smokeless tobacco: Never Used  . Alcohol Use: 0.0 oz/week    0 Standard drinks or equivalent per week     Comment: occasional  . Drug Use: No  . Sexual Activity: No   Other Topics Concern  . Not on file   Social History Narrative     ROS A 10 point review of system was performed. It is negative other than that mentioned in the history of present illness.   PHYSICAL EXAM   BP 126/70 mmHg  Pulse 98  Ht 5\' 7"  (1.702 m)  Wt 255 lb (115.667 kg)  BMI 39.93 kg/m2 Constitutional: He is oriented to person, place, and time. He appears well-developed and well-nourished. No distress.  HENT: No nasal discharge.  Head: Normocephalic and atraumatic.  Eyes: Pupils are equal and round.  No discharge. Neck: Normal range of motion. Neck supple. No JVD present. No thyromegaly present.  Cardiovascular: Normal rate, regular rhythm, normal heart sounds. Exam reveals no gallop and no friction rub. No murmur heard.  Pulmonary/Chest: Effort normal and breath sounds normal. No stridor. No respiratory distress. He has no wheezes. He has no rales. He exhibits no tenderness.  Abdominal: Soft. Bowel sounds are normal. He exhibits no distension. There is no tenderness. There is no rebound and no guarding.  Musculoskeletal: Normal range of motion. He exhibits no edema and  no tenderness.  Neurological: He is alert and oriented to person, place, and time. Coordination normal.  Skin: Skin is warm and dry. No rash noted. He is not diaphoretic. No erythema. No pallor.  Psychiatric: He has a normal mood and affect. His behavior is normal. Judgment and thought content normal.       [id number] sinus rhythm with evidence of prior inferior and anterolateral infarcts.   ASSESSMENT AND PLAN

## 2015-10-22 NOTE — Patient Instructions (Signed)
Medication Instructions:  Your physician recommends that you continue on your current medications as directed. Please refer to the Current Medication list given to you today.   Labwork: none  Testing/Procedures: Your physician has requested that you have an echocardiogram. Echocardiography is a painless test that uses sound waves to create images of your heart. It provides your doctor with information about the size and shape of your heart and how well your heart's chambers and valves are working. This procedure takes approximately one hour. There are no restrictions for this procedure.    Follow-Up: Your physician recommends that you schedule a follow-up appointment in: three months with Dr. Fletcher Anon.    Any Other Special Instructions Will Be Listed Below (If Applicable).     If you need a refill on your cardiac medications before your next appointment, please call your pharmacy.  Echocardiogram An echocardiogram, or echocardiography, uses sound waves (ultrasound) to produce an image of your heart. The echocardiogram is simple, painless, obtained within a short period of time, and offers valuable information to your health care provider. The images from an echocardiogram can provide information such as:  Evidence of coronary artery disease (CAD).  Heart size.  Heart muscle function.  Heart valve function.  Aneurysm detection.  Evidence of a past heart attack.  Fluid buildup around the heart.  Heart muscle thickening.  Assess heart valve function. LET [facility] CARE PROVIDER KNOW ABOUT:  Any allergies you have.  All medicines you are taking, including vitamins, herbs, eye drops, creams, and over-the-counter medicines.  Previous problems you or members of your family have had with the use of anesthetics.  Any blood disorders you have.  Previous surgeries you have had.  Medical conditions you have.  Possibility of pregnancy, if this applies. BEFORE THE PROCEDURE   No special preparation is needed. Eat and drink normally.  PROCEDURE   In order to produce an image of your heart, gel will be applied to your chest and a wand-like tool (transducer) will be moved over your chest. The gel will help transmit the sound waves from the transducer. The sound waves will harmlessly bounce off your heart to allow the heart images to be captured in real-time motion. These images will then be recorded.  You may need an IV to receive a medicine that improves the quality of the pictures. AFTER THE PROCEDURE You may return to your normal schedule including diet, activities, and medicines, unless your health care provider tells you otherwise.   This information is not intended to replace advice given to you by your health care provider. Make sure you discuss any questions you have with your health care provider.   Document Released: 09/18/2000 Document Revised: 10/12/2014 Document Reviewed: 05/29/2013 Elsevier Interactive Patient Education [employer].

## 2015-10-22 NOTE — Assessment & Plan Note (Signed)
The patient has extensive history of ischemic heart disease with multiple stent placements. He reports chronic rare chest pain with no recent worsening. His symptoms seem to be well-controlled with medications. He seems to be mostly bothered by significant exertional dyspnea and we don't have evaluation of his LV systolic function. Thus, I requested an echocardiogram for evaluation. Requested his prior cardiac records for review.

## 2015-10-22 NOTE — Assessment & Plan Note (Signed)
Blood pressure is well controlled on current medications. I think he can stay on both Ziac and hydrochlorothiazide given that the dose of hydrochlorothiazide in Ziac is very small. His resting heart rate is somewhat on the high side and at some point we might need to consider to switch him to carvedilol.

## 2015-10-22 NOTE — Telephone Encounter (Signed)
S/w Trish at Dr. Princess Bruins Gurdin's office to have cardiology records forwarded to our office. Wannetta Sender states they will fax information and notify us if they need release of information.

## 2015-10-22 NOTE — Assessment & Plan Note (Signed)
He reports intolerance to high-dose statins. He is currently  on small dose rosuvastatin and this is currently being managed by his endocrinologist who is also managing his diabetes. I recommend a target LDL of less than 70.

## 2015-10-25 ENCOUNTER — Ambulatory Visit (INDEPENDENT_AMBULATORY_CARE_PROVIDER_SITE_OTHER): Payer: Medicare Other

## 2015-10-25 ENCOUNTER — Other Ambulatory Visit: Payer: Self-pay

## 2015-10-25 DIAGNOSIS — R0602 Shortness of breath: Secondary | ICD-10-CM

## 2015-10-25 LAB — TOXASSURE SELECT 13 (MW), URINE: PDF: 0

## 2015-10-30 ENCOUNTER — Ambulatory Visit: Payer: Medicare Other | Admitting: Gastroenterology

## 2015-11-01 ENCOUNTER — Telehealth: Payer: Self-pay

## 2015-11-01 NOTE — Telephone Encounter (Signed)
Pt records from Dr. Timmothy Euler office placed in MD office

## 2015-11-01 NOTE — Telephone Encounter (Signed)
error 

## 2015-11-04 ENCOUNTER — Ambulatory Visit: Payer: Medicare Other | Attending: Anesthesiology | Admitting: Anesthesiology

## 2015-11-04 ENCOUNTER — Encounter: Payer: Self-pay | Admitting: Anesthesiology

## 2015-11-04 VITALS — BP 125/67 | HR 85 | Temp 97.4°F | Resp 16 | Ht 67.0 in | Wt 257.0 lb

## 2015-11-04 DIAGNOSIS — G7000 Myasthenia gravis without (acute) exacerbation: Secondary | ICD-10-CM

## 2015-11-04 DIAGNOSIS — M5136 Other intervertebral disc degeneration, lumbar region: Secondary | ICD-10-CM | POA: Diagnosis not present

## 2015-11-04 DIAGNOSIS — M47817 Spondylosis without myelopathy or radiculopathy, lumbosacral region: Secondary | ICD-10-CM | POA: Diagnosis not present

## 2015-11-04 DIAGNOSIS — K219 Gastro-esophageal reflux disease without esophagitis: Secondary | ICD-10-CM | POA: Insufficient documentation

## 2015-11-04 DIAGNOSIS — A6923 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: Secondary | ICD-10-CM | POA: Insufficient documentation

## 2015-11-04 DIAGNOSIS — I252 Old myocardial infarction: Secondary | ICD-10-CM | POA: Insufficient documentation

## 2015-11-04 DIAGNOSIS — F329 Major depressive disorder, single episode, unspecified: Secondary | ICD-10-CM | POA: Insufficient documentation

## 2015-11-04 DIAGNOSIS — Z87891 Personal history of nicotine dependence: Secondary | ICD-10-CM | POA: Diagnosis not present

## 2015-11-04 DIAGNOSIS — E119 Type 2 diabetes mellitus without complications: Secondary | ICD-10-CM | POA: Insufficient documentation

## 2015-11-04 DIAGNOSIS — I10 Essential (primary) hypertension: Secondary | ICD-10-CM | POA: Diagnosis not present

## 2015-11-04 DIAGNOSIS — M542 Cervicalgia: Secondary | ICD-10-CM | POA: Insufficient documentation

## 2015-11-04 DIAGNOSIS — M479 Spondylosis, unspecified: Secondary | ICD-10-CM | POA: Diagnosis not present

## 2015-11-04 DIAGNOSIS — G4730 Sleep apnea, unspecified: Secondary | ICD-10-CM | POA: Diagnosis not present

## 2015-11-04 DIAGNOSIS — E785 Hyperlipidemia, unspecified: Secondary | ICD-10-CM | POA: Diagnosis not present

## 2015-11-04 DIAGNOSIS — M549 Dorsalgia, unspecified: Secondary | ICD-10-CM | POA: Diagnosis present

## 2015-11-04 MED ORDER — TRIAMCINOLONE ACETONIDE 40 MG/ML IJ SUSP
INTRAMUSCULAR | Status: AC
  Administered 2015-11-04: 15:00:00
  Filled 2015-11-04: qty 1

## 2015-11-04 MED ORDER — FENTANYL CITRATE (PF) 100 MCG/2ML IJ SOLN
INTRAMUSCULAR | Status: AC
  Filled 2015-11-04: qty 2

## 2015-11-04 MED ORDER — LIDOCAINE HCL (PF) 1 % IJ SOLN: 5.0000 mL | Freq: Once | INTRAMUSCULAR | Status: DC

## 2015-11-04 MED ORDER — CYCLOBENZAPRINE HCL 10 MG PO TABS: 10.0000 mg | ORAL_TABLET | Freq: Three times a day (TID) | ORAL | Status: DC | PRN

## 2015-11-04 MED ORDER — ROPIVACAINE HCL 2 MG/ML IJ SOLN: 10.0000 mL | Freq: Once | INTRAMUSCULAR | Status: DC

## 2015-11-04 MED ORDER — ROPIVACAINE HCL 2 MG/ML IJ SOLN
INTRAMUSCULAR | Status: AC
  Administered 2015-11-04: 15:00:00
  Filled 2015-11-04: qty 10

## 2015-11-04 MED ORDER — MIDAZOLAM HCL 5 MG/5ML IJ SOLN
INTRAMUSCULAR | Status: AC
  Administered 2015-11-04: 4 mg via INTRAVENOUS
  Filled 2015-11-04: qty 5

## 2015-11-04 MED ORDER — OXYCODONE HCL 10 MG PO TABS: 10.0000 mg | ORAL_TABLET | Freq: Four times a day (QID) | ORAL | Status: DC | PRN

## 2015-11-04 MED ORDER — MIDAZOLAM HCL 2 MG/2ML IJ SOLN: 5.0000 mg | Freq: Once | INTRAMUSCULAR | Status: DC

## 2015-11-04 MED ORDER — SODIUM CHLORIDE 0.9% FLUSH: 10.0000 mL | Freq: Once | INTRAVENOUS | Status: DC

## 2015-11-04 MED ORDER — TRIAMCINOLONE ACETONIDE 40 MG/ML IJ SUSP: 40.0000 mg | Freq: Once | INTRAMUSCULAR | Status: DC

## 2015-11-04 NOTE — Patient Instructions (Addendum)
Facet Blocks Patient Information  Description: The facets are joints in the spine between the vertebrae.  Like any joints in the body, facets can become irritated and painful.  Arthritis can also effect the facets.  By injecting steroids and local anesthetic in and around these joints, we can temporarily block the nerve supply to them.  Steroids act directly on irritated nerves and tissues to reduce selling and inflammation which often leads to decreased pain.  Facet blocks may be done anywhere along the spine from the neck to the low back depending upon the location of your pain.   After numbing the skin with local anesthetic (like Novocaine), a small needle is passed onto the facet joints under x-ray guidance.  You may experience a sensation of pressure while this is being done.  The entire block usually lasts about 15-25 minutes.   Conditions which may be treated by facet blocks:   Low back/buttock pain  Neck/shoulder pain  Certain types of headaches  Preparation for the injection:  1. Do not eat any solid food or dairy products within 6 hours of your appointment. 2. You may drink clear liquid up to 2 hours before appointment.  Clear liquids include water, black coffee, juice or soda.  No milk or cream please. 3. You may take your regular medication, including pain medications, with a sip of water before your appointment.  Diabetics should hold regular insulin (if taken separately) and take 1/2 normal NPH dose the morning of the procedure.  Carry some sugar containing items with you to your appointment. 4. A driver must accompany you and be prepared to drive you home after your procedure. 5. Bring all your current medications with you. 6. An IV may be inserted and sedation may be given at the discretion of the physician. 7. A blood pressure cuff, EKG and other monitors will often be applied during the procedure.  Some patients may need to have extra oxygen administered for a short  period. 8. You will be asked to provide medical information, including your allergies and medications, prior to the procedure.  We must know immediately if you are taking blood thinners (like Coumadin/Warfarin) or if you are allergic to IV iodine contrast (dye).  We must know if you could possible be pregnant.  Possible side-effects:   Bleeding from needle site  Infection (rare, may require surgery)  Nerve injury (rare)  Numbness & tingling (temporary)  Difficulty urinating (rare, temporary)  Spinal headache (a headache worse with upright posture)  Light-headedness (temporary)  Pain at injection site (serveral days)  Decreased blood pressure (rare, temporary)  Weakness in arm/leg (temporary)  Pressure sensation in back/neck (temporary)   Call if you experience:   Fever/chills associated with headache or increased back/neck pain  Headache worsened by an upright position  New onset, weakness or numbness of an extremity below the injection site  Hives or difficulty breathing (go to the emergency room)  Inflammation or drainage at the injection site(s)  Severe back/neck pain greater than usual  New symptoms which are concerning to you  Please note:  Although the local anesthetic injected can often make your back or neck feel good for several hours after the injection, the pain will likely return. It takes 3-7 days for steroids to work.  You may not notice any pain relief for at least one week.  If effective, we will often do a series of 2-3 injections spaced 3-6 weeks apart to maximally decrease your pain.  After the initial   series, you may be a candidate for a more permanent nerve block of the facets.  If you have any questions, please call #[phone]) [facility]  General Discharge Instructions :  If you need to reach your doctor call: Monday-Friday 8:00 am - 4:00 pm at [phone] or  toll free [phone].  After clinic hours [phone] to have operator reach doctor.  Bring all of your medication bottles to all your appointments in the pain clinic.  To cancel or reschedule your appointment with Pain Management please remember to call 24 hours in advance to avoid a fee.  Refer to the educational materials which you have been given on: General Risks, I had my Procedure. Discharge Instructions, Post Sedation.  Post Procedure Instructions:  The drugs you were given will stay in your system until tomorrow, so for the next 24 hours you should not drive, make any legal decisions or drink any alcoholic beverages.  You may eat anything you prefer, but it is better to start with liquids then soups and crackers, and gradually work up to solid foods.  Please notify your doctor immediately if you have any unusual bleeding, trouble breathing or pain that is not related to your normal pain.  Depending on the type of procedure that was done, some parts of your body may feel week and/or numb.  This usually clears up by tonight or the next day.  Walk with the use of an assistive device or accompanied by an adult for the 24 hours.  You may use ice on the affected area for the first 24 hours.  Put ice in a Ziploc bag and cover with a towel and place against area 15 minutes on 15 minutes off.  You may switch to heat after 24 hours.GENERAL RISKS AND COMPLICATIONS  What are the risk, side effects and possible complications? Generally speaking, most procedures are safe.  However, with any procedure there are risks, side effects, and the possibility of complications.  The risks and complications are dependent upon the sites that are lesioned, or the type of nerve block to be performed.  The closer the procedure is to the spine, the more serious the risks are.  Great care is taken when placing the radio frequency needles, block needles or lesioning probes, but sometimes complications can  occur. 1. Infection: Any time there is an injection through the skin, there is a risk of infection.  This is why sterile conditions are used for these blocks.  There are four possible types of infection. 1. Localized skin infection. 2. Central Nervous System Infection-This can be in the form of Meningitis, which can be deadly. 3. Epidural Infections-This can be in the form of an epidural abscess, which can cause pressure inside of the spine, causing compression of the spinal cord with subsequent paralysis. This would require an emergency surgery to decompress, and there are no guarantees that the patient would recover from the paralysis. 4. Discitis-This is an infection of the intervertebral discs.  It occurs in about 1% of discography procedures.  It is difficult to treat and it may lead to surgery.        2. Pain: the needles have to go through skin and soft tissues, will cause soreness.       3. Damage to internal structures:  The nerves to be lesioned may be near blood vessels or    other nerves which can be potentially damaged.  4. Bleeding: Bleeding is more common if the patient is taking blood thinners such as  aspirin, Coumadin, Ticiid, Plavix, etc., or if he/she have some genetic predisposition  such as hemophilia. Bleeding into the spinal canal can cause compression of the spinal  cord with subsequent paralysis.  This would require an emergency surgery to  decompress and there are no guarantees that the patient would recover from the  paralysis.       5. Pneumothorax:  Puncturing of a lung is a possibility, every time a needle is introduced in  the area of the chest or upper back.  Pneumothorax refers to free air around the  collapsed lung(s), inside of the thoracic cavity (chest cavity).  Another two possible  complications related to a similar event would include: Hemothorax and Chylothorax.   These are variations of the Pneumothorax, where instead of air around the collapsed  lung(s),  you may have blood or chyle, respectively.       6. Spinal headaches: They may occur with any procedures in the area of the spine.       7. Persistent CSF (Cerebro-Spinal Fluid) leakage: This is a rare problem, but may occur  with prolonged intrathecal or epidural catheters either due to the formation of a fistulous  track or a dural tear.       8. Nerve damage: By working so close to the spinal cord, there is always a possibility of  nerve damage, which could be as serious as a permanent spinal cord injury with  paralysis.       9. Death:  Although rare, severe deadly allergic reactions known as "Anaphylactic  reaction" can occur to any of the medications used.      10. Worsening of the symptoms:  We can always make thing worse.  What are the chances of something like this happening? Chances of any of this occuring are extremely low.  By statistics, you have more of a chance of getting killed in a motor vehicle accident: while driving to the hospital than any of the above occurring .  Nevertheless, you should be aware that they are possibilities.  In general, it is similar to taking a shower.  Everybody knows that you can slip, hit your head and get killed.  Does that mean that you should not shower again?  Nevertheless always keep in mind that statistics do not mean anything if you happen to be on the wrong side of them.  Even if a procedure has a 1 (one) in a 1,000,000 (million) chance of going wrong, it you happen to be that one..Also, keep in mind that by statistics, you have more of a chance of having something go wrong when taking medications.  Who should not have this procedure? If you are on a blood thinning medication (e.g. Coumadin, Plavix, see list of "Blood Thinners"), or if you have an active infection going on, you should not have the procedure.  If you are taking any blood thinners, please inform your physician.  How should I prepare for this procedure?  Do not eat or drink anything at  least six hours prior to the procedure.  Bring a driver with you .  It cannot be a taxi.  Come accompanied by an adult that can drive you back, and that is strong enough to help you if your legs get weak or numb from the local anesthetic.  Take all of your medicines the morning of the procedure with just enough water  to swallow them.  If you have diabetes, make sure that you are scheduled to have your procedure done first thing in the morning, whenever possible.  If you have diabetes, take only half of your insulin dose and notify our nurse that you have done so as soon as you arrive at the clinic.  If you are diabetic, but only take blood sugar pills (oral hypoglycemic), then do not take them on the morning of your procedure.  You may take them after you have had the procedure.  Do not take aspirin or any aspirin-containing medications, at least eleven (11) days prior to the procedure.  They may prolong bleeding.  Wear loose fitting clothing that may be easy to take off and that you would not mind if it got stained with Betadine or blood.  Do not wear any jewelry or perfume  Remove any nail coloring.  It will interfere with some of our monitoring equipment.  NOTE: Remember that this is not meant to be interpreted as a complete list of all possible complications.  Unforeseen problems may occur.  BLOOD THINNERS The following drugs contain aspirin or other products, which can cause increased bleeding during surgery and should not be taken for 2 weeks prior to and 1 week after surgery.  If you should need take something for relief of minor pain, you may take acetaminophen which is found in Tylenol,m Datril, Anacin-3 and Panadol. It is not blood thinner. The products listed below are.  Do not take any of the products listed below in addition to any listed on your instruction sheet.  A.P.C or A.P.C with Codeine Codeine Phosphate Capsules #3 Ibuprofen Ridaura  ABC compound Congesprin Imuran  rimadil  Advil Cope Indocin Robaxisal  Alka-Seltzer Effervescent Pain Reliever and Antacid Coricidin or Coricidin-D  Indomethacin Rufen  Alka-Seltzer plus Cold Medicine Cosprin Ketoprofen S-A-C Tablets  Anacin Analgesic Tablets or Capsules Coumadin Korlgesic Salflex  Anacin Extra Strength Analgesic tablets or capsules CP-2 Tablets Lanoril Salicylate  Anaprox Cuprimine Capsules Levenox Salocol  Anexsia-D Dalteparin Magan Salsalate  Anodynos Darvon compound Magnesium Salicylate Sine-off  Ansaid Dasin Capsules Magsal Sodium Salicylate  Anturane Depen Capsules Marnal Soma  APF Arthritis pain formula Dewitt's Pills Measurin Stanback  Argesic Dia-Gesic Meclofenamic Sulfinpyrazone  Arthritis Bayer Timed Release Aspirin Diclofenac Meclomen Sulindac  Arthritis pain formula Anacin Dicumarol Medipren Supac  Analgesic (Safety coated) Arthralgen Diffunasal Mefanamic Suprofen  Arthritis Strength Bufferin Dihydrocodeine Mepro Compound Suprol  Arthropan liquid Dopirydamole Methcarbomol with Aspirin Synalgos  ASA tablets/Enseals Disalcid Micrainin Tagament  Ascriptin Doan's Midol Talwin  Ascriptin A/D Dolene Mobidin Tanderil  Ascriptin Extra Strength Dolobid Moblgesic Ticlid  Ascriptin with Codeine Doloprin or Doloprin with Codeine Momentum Tolectin  Asperbuf Duoprin Mono-gesic Trendar  Aspergum Duradyne Motrin or Motrin IB Triminicin  Aspirin plain, buffered or enteric coated Durasal Myochrisine Trigesic  Aspirin Suppositories Easprin Nalfon Trillsate  Aspirin with Codeine Ecotrin Regular or Extra Strength Naprosyn Uracel  Atromid-S Efficin Naproxen Ursinus  Auranofin Capsules Elmiron Neocylate Vanquish  Axotal Emagrin Norgesic Verin  Azathioprine Empirin or Empirin with Codeine Normiflo Vitamin E  Azolid Emprazil Nuprin Voltaren  Bayer Aspirin plain, buffered or children's or timed BC Tablets or powders Encaprin Orgaran Warfarin Sodium  Buff-a-Comp Enoxaparin Orudis Zorpin  Buff-a-Comp with  Codeine Equegesic Os-Cal-Gesic   Buffaprin Excedrin plain, buffered or Extra Strength Oxalid   Bufferin Arthritis Strength Feldene Oxphenbutazone   Bufferin plain or Extra Strength Feldene Capsules Oxycodone with Aspirin   Bufferin with Codeine Fenoprofen Fenoprofen Pabalate or Pabalate-SF  Buffets II Flogesic Panagesic   Buffinol plain or Extra Strength Florinal or Florinal with Codeine Panwarfarin   Buf-Tabs Flurbiprofen Penicillamine   Butalbital Compound Four-way cold tablets Penicillin   Butazolidin Fragmin Pepto-Bismol   Carbenicillin Geminisyn Percodan   Carna Arthritis Reliever Geopen Persantine   Carprofen Gold's salt Persistin   Chloramphenicol Goody's Phenylbutazone   Chloromycetin Haltrain Piroxlcam   Clmetidine heparin Plaquenil   Cllnoril Hyco-pap Ponstel   Clofibrate Hydroxy chloroquine Propoxyphen         Before stopping any of these medications, be sure to consult the physician who ordered them.  Some, such as Coumadin (Warfarin) are ordered to prevent or treat serious conditions such as "deep thrombosis", "pumonary embolisms", and other heart problems.  The amount of time that you may need off of the medication may also vary with the medication and the reason for which you were taking it.  If you are taking any of these medications, please make sure you notify your pain physician before you undergo any procedures.

## 2015-11-05 ENCOUNTER — Telehealth: Payer: Self-pay | Admitting: *Deleted

## 2015-11-05 NOTE — Progress Notes (Signed)
Subjective:  Patient ID: [name], male    DOB: 21-Feb-1961  Age: 55 y.o. MRN: [id number]  CC: Back Pain   HPI [name] presents for reevaluation today. He was last seen a month ago for new patient evaluation and is scheduled for a facet block today. The quality characteristic and distribution of his pain a been stable in nature with no significant changes noted today. He is taking his medications as prescribed with no problems noted.  He also has a history of myasthenia gravis which is currently followed by Dr. Melrose Nakayama. He was also followed by Dr. Maree Erie in [place] for his pain management.  He's had previous MRI and other nerve blocks x-rays nerve conduction studies and a previous neurologic evaluation and this has been requested from his pain management clinic in [place].  History [name] has a past medical history of Asthma; Diabetes mellitus without complication ([place]); GERD (gastroesophageal reflux disease); Hyperlipidemia; Hypertension; Allergy; Lyme disease; Arthritis; Cataract; Myasthenia gravis ([place]); Diabetic peripheral neuropathy ([place]); Insomnia; and Coronary artery disease.   He has past surgical history that includes Cardiac catheterization; Eye surgery (Bilateral, 2012); Tonsillectomy and adenoidectomy; Bilateral carpal tunnel release (Bilateral, L in 2012 and R in 2013); and Coronary angioplasty.   His family history includes Cancer in his father; Diabetes in his mother; Heart disease in his mother.He reports that he has quit smoking. He has never used smokeless tobacco. He reports that he drinks alcohol. He reports that he does not use illicit drugs.   ---------------------------------------------------------------------------------------------------------------------- Past Medical History  Diagnosis Date  . Asthma   . Diabetes mellitus without complication ([place])   . GERD (gastroesophageal reflux disease)   . Hyperlipidemia   . Hypertension   . Allergy      dust, seasonal (worse in the fall).  . Lyme disease     Chronic  . 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 Followed by Pain Specialist in [place]  . Cataract     First Dx in 2012  . Myasthenia gravis ([place])   . Diabetic peripheral neuropathy ([place])   . Insomnia   . Coronary artery disease     Has 10 stents.    Past Surgical History  Procedure Laterality Date  . Cardiac catheterization      Several Caths, most recent in  March 2016.  [name] Eye surgery Bilateral 2012    cataract  . Tonsillectomy and adenoidectomy      As a child  . Bilateral carpal tunnel release Bilateral L in 2012 and R in 2013  . Coronary angioplasty      Family History  Problem Relation Age of Onset  . Diabetes Mother   . Heart disease Mother   . Cancer Father     Prostate CA    Social History  Substance Use Topics  . Smoking status: Former Research scientist (life sciences)  . Smokeless tobacco: Never Used  . Alcohol Use: 0.0 oz/week    0 Standard drinks or equivalent per week     Comment: occasional    ---------------------------------------------------------------------------------------------------------------------- Social History   Social History  . Marital Status: Married    Spouse Name: N/A  . Number of Children: N/A  . Years of Education: N/A   Social History Main Topics  . Smoking status: Former Research scientist (life sciences)  . Smokeless tobacco: Never Used  . Alcohol Use: 0.0 oz/week    0 Standard drinks or equivalent per week     Comment: occasional  . Drug Use: No  . Sexual Activity:  No   Other Topics Concern  . None   Social History Narrative      ----------------------------------------------------------------------------------------------------------------------  ROS Review of Systems   Cardiac is positive for high blood pressure chest pain previous heart attack status post stent Pulmonary positive for history of asthma and sleep apnea Neurologic is positive for Lyme disease myasthenia gravis and peripheral  neuropathy Psychological is positive for depression Gastrointestinal positive for reflux Endocrine is positive for diabetes   Objective:  BP 125/67 mmHg  Pulse 85  Temp(Src) 97.4 F (36.3 C) (Oral)  Resp 16  Ht 5\' 7"  (1.702 m)  Wt 257 lb (116.574 kg)  BMI 40.24 kg/m2  SpO2 99%  Physical Exam Patient is alert oriented cooperative compliant and a good historian Heart is regular rate and rhythm without murmur Lungs show extremity wheezing worse on the right than left Inspection of his low back continues to reveals significant paraspinous muscle tenderness primarily on the left side of his low back. He continues to have pain on extension with left lateral rotation which is his baseline pain complaint. Otherwise no change in lower extremity strength puncture noted at this time.    Assessment & Plan:   [name] was seen today for back pain.  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HCC)  DDD (degenerative disc disease), lumbar  Facet arthritis of lumbosacral region -     lidocaine (PF) (XYLOCAINE) 1 % injection 5 mL; Inject 5 mLs into the skin once. -     midazolam (VERSED) injection 5 mg; Inject 5 mLs (5 mg total) into the vein once. -     ropivacaine (PF) 2 mg/ml (0.2%) (NAROPIN) epidural 10 mL; 10 mLs by Epidural route once. -     sodium chloride flush (NS) 0.9 % injection 10 mL; 10 mLs by Other route once. -     triamcinolone acetonide (KENALOG-40) injection 40 mg; 1 mL (40 mg total) by Other route once.  Myasthenia gravis ([place])  Cervicalgia  Other orders -     Oxycodone HCl 10 MG TABS; Take 1 tablet (10 mg total) by mouth 4 (four) times daily as needed. -     ropivacaine (PF) 2 mg/ml (0.2%) (NAROPIN) 2 MG/ML epidural;  -     triamcinolone acetonide (KENALOG-40) 40 MG/ML injection;  -     fentaNYL (SUBLIMAZE) 100 MCG/2ML injection;  -     midazolam (VERSED) 5 MG/5ML injection;  -     cyclobenzaprine (FLEXERIL) 10 MG tablet; Take 1  tablet (10 mg total) by mouth 3 (three) times daily as needed for muscle spasms.     ----------------------------------------------------------------------------------------------------------------------  Problem List Items Addressed This Visit      Musculoskeletal and Integument   Myasthenia gravis ([place])    Other Visit Diagnoses    Lyme arthritis of multiple joints (HCC)    -  Primary    Relevant Medications    Oxycodone HCl 10 MG TABS    triamcinolone acetonide (KENALOG-40) 40 MG/ML injection (Completed)    triamcinolone acetonide (KENALOG-40) injection 40 mg    cyclobenzaprine (FLEXERIL) 10 MG tablet    DDD (degenerative disc disease), lumbar        Relevant Medications    Oxycodone HCl 10 MG TABS    triamcinolone acetonide (KENALOG-40) 40 MG/ML injection (Completed)    triamcinolone acetonide (KENALOG-40) injection 40 mg    cyclobenzaprine (FLEXERIL) 10 MG tablet    Facet arthritis of lumbosacral region  Relevant Medications    Oxycodone HCl 10 MG TABS    triamcinolone acetonide (KENALOG-40) 40 MG/ML injection (Completed)    lidocaine (PF) (XYLOCAINE) 1 % injection 5 mL    midazolam (VERSED) injection 5 mg    ropivacaine (PF) 2 mg/ml (0.2%) (NAROPIN) epidural 10 mL    sodium chloride flush (NS) 0.9 % injection 10 mL    triamcinolone acetonide (KENALOG-40) injection 40 mg    cyclobenzaprine (FLEXERIL) 10 MG tablet    Cervicalgia           ----------------------------------------------------------------------------------------------------------------------  1.  Facet arthritis of lumbosacral region We'll proceed with his first facet injection today. The risks benefits of an reviewed once again all questions answered and no guarantees were made. Have her return to clinic in 1 month for reevaluation and a probable repeat injection at that time.  2. DDD (degenerative disc disease), lumbar   3 Lyme arthritis of multiple joints (HCC) We will write for his oxycodone  today. He States that this is working well for him in the past and kept his pain under good control. He's been compliant on his regimen and denies diverting or illicit use. We have requested his previous information from his pain management Center in [place].  4. Myasthenia gravis ([place]) Continue follow-up with Dr. Melrose Nakayama  5. Cervicalgia I discussed some options with him regarding stretching strengthening exercises for his neck pain. This seems to be stable in nature with no complaints of numbness or tingling affecting the upper extremities no loss of strength noted per patient. If symptoms change we will consider a neurosurgical evaluation and further studies if indicated.    ----------------------------------------------------------------------------------------------------------------------  I have discontinued Mr. Hemme's fluticasone. I have also changed his Oxycodone HCl and cyclobenzaprine. Additionally, I am having him maintain his insulin glargine, insulin lispro, pyridostigmine, aspirin, bisoprolol-hydrochlorothiazide, nitroGLYCERIN, Loratadine (CLARITIN PO), albuterol, mometasone-formoterol, hydrochlorothiazide, augmented betamethasone dipropionate, glucose blood, gabapentin, gabapentin, amLODipine, pantoprazole, zolpidem, rosuvastatin, and metFORMIN. We administered ropivacaine (PF) 2 mg/ml (0.2%), triamcinolone acetonide, and midazolam. We will continue to administer lidocaine (PF), midazolam, ropivacaine (PF) 2 mg/ml (0.2%), sodium chloride flush, and triamcinolone acetonide.   Meds ordered this encounter  Medications  . Oxycodone HCl 10 MG TABS    Sig: Take 1 tablet (10 mg total) by mouth 4 (four) times daily as needed.    Dispense:  120 tablet    Refill:  0  . ropivacaine (PF) 2 mg/ml (0.2%) (NAROPIN) 2 MG/ML epidural    Sig:     Donneta Romberg, Dena: cabinet override  . triamcinolone acetonide (KENALOG-40) 40 MG/ML injection    Sig:     Donneta Romberg, Dena: cabinet override  .  fentaNYL (SUBLIMAZE) 100 MCG/2ML injection    Sig:     Donneta Romberg, Dena: cabinet override  . midazolam (VERSED) 5 MG/5ML injection    Sig:     Donneta Romberg, Dena: cabinet override  . lidocaine (PF) (XYLOCAINE) 1 % injection 5 mL    Sig:   . midazolam (VERSED) injection 5 mg    Sig:   . ropivacaine (PF) 2 mg/ml (0.2%) (NAROPIN) epidural 10 mL    Sig:   . sodium chloride flush (NS) 0.9 % injection 10 mL    Sig:   . triamcinolone acetonide (KENALOG-40) injection 40 mg    Sig:   . cyclobenzaprine (FLEXERIL) 10 MG tablet    Sig: Take 1 tablet (10 mg total) by mouth 3 (three) times daily as needed for muscle spasms.    Dispense:  90 tablet  Refill:  2       Follow-up: Return in about 1 month (around 12/03/2015) for evaluation, procedure.    Patient was taken to the fluoroscopy suite and placed in prone position. A total dose of 4 mg of Versed with 0 cc of fentanyl were titrated for moderate sedation. Vital signs are stable throughout the procedure. The area overlying the aforementioned facets on the left side were prepped with Betadine 3 and strict aseptic technique was utilized throughout the procedure. Identified the areas overlying the after mentioned facets at L2-3 L3-4 or L4-5 L5-S1. 1% lidocaine 1 cc was infiltrated subcutaneous and into the fascia with a 25-gauge needle at each of these sites. I then advanced a 22-gauge 3-1/2 inch [place] needle with the needle tip to lie at the [facility] portion of the [employer] dog". There was negative aspiration for heme or CSF and  no paresthesia. I then injected 2 cc of ropivacaine 0.2% mixed with 8 mg of triamcinolone at each of the aforementioned sites. These needles were withdrawn and the L5-S1 needle was redirected towards the left S1 posterior foramen. This was once again no paresthesia, negative aspiration and 2 cc of this same mixture was injected at this site. The patient was convalesced discharged home stable condition for follow-up as  mentioned.   Molli Barrows, MD

## 2015-11-05 NOTE — Telephone Encounter (Signed)
Left voicemail to call our office with any questions or concerns.

## 2015-11-26 ENCOUNTER — Encounter: Payer: Self-pay | Admitting: Anesthesiology

## 2015-11-26 ENCOUNTER — Ambulatory Visit: Payer: Medicare Other | Attending: Anesthesiology | Admitting: Anesthesiology

## 2015-11-26 VITALS — BP 133/75 | HR 99 | Temp 98.3°F | Resp 16 | Ht 67.0 in | Wt 257.0 lb

## 2015-11-26 DIAGNOSIS — E785 Hyperlipidemia, unspecified: Secondary | ICD-10-CM | POA: Insufficient documentation

## 2015-11-26 DIAGNOSIS — F329 Major depressive disorder, single episode, unspecified: Secondary | ICD-10-CM | POA: Insufficient documentation

## 2015-11-26 DIAGNOSIS — K219 Gastro-esophageal reflux disease without esophagitis: Secondary | ICD-10-CM | POA: Insufficient documentation

## 2015-11-26 DIAGNOSIS — M542 Cervicalgia: Secondary | ICD-10-CM | POA: Diagnosis not present

## 2015-11-26 DIAGNOSIS — Z9861 Coronary angioplasty status: Secondary | ICD-10-CM | POA: Diagnosis not present

## 2015-11-26 DIAGNOSIS — A6923 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: Secondary | ICD-10-CM | POA: Diagnosis not present

## 2015-11-26 DIAGNOSIS — E119 Type 2 diabetes mellitus without complications: Secondary | ICD-10-CM | POA: Insufficient documentation

## 2015-11-26 DIAGNOSIS — I2510 Atherosclerotic heart disease of native coronary artery without angina pectoris: Secondary | ICD-10-CM | POA: Diagnosis not present

## 2015-11-26 DIAGNOSIS — M47817 Spondylosis without myelopathy or radiculopathy, lumbosacral region: Secondary | ICD-10-CM

## 2015-11-26 DIAGNOSIS — J45909 Unspecified asthma, uncomplicated: Secondary | ICD-10-CM | POA: Insufficient documentation

## 2015-11-26 DIAGNOSIS — M5136 Other intervertebral disc degeneration, lumbar region: Secondary | ICD-10-CM | POA: Diagnosis not present

## 2015-11-26 DIAGNOSIS — I10 Essential (primary) hypertension: Secondary | ICD-10-CM | POA: Diagnosis not present

## 2015-11-26 DIAGNOSIS — M549 Dorsalgia, unspecified: Secondary | ICD-10-CM | POA: Diagnosis present

## 2015-11-26 DIAGNOSIS — M47897 Other spondylosis, lumbosacral region: Secondary | ICD-10-CM | POA: Diagnosis not present

## 2015-11-26 DIAGNOSIS — G7000 Myasthenia gravis without (acute) exacerbation: Secondary | ICD-10-CM | POA: Diagnosis not present

## 2015-11-26 DIAGNOSIS — Z87891 Personal history of nicotine dependence: Secondary | ICD-10-CM | POA: Diagnosis not present

## 2015-11-26 MED ORDER — OXYCODONE HCL 10 MG PO TABS: 10.0000 mg | ORAL_TABLET | Freq: Four times a day (QID) | ORAL | Status: DC | PRN

## 2015-11-26 NOTE — Patient Instructions (Signed)
Pain Management Discharge Instructions  General Discharge Instructions :  If you need to reach your doctor call: Monday-Friday 8:00 am - 4:00 pm at [phone] or toll free [phone].  After clinic hours [phone] to have operator reach doctor.  Bring all of your medication bottles to all your appointments in the pain clinic.  To cancel or reschedule your appointment with Pain Management please remember to call 24 hours in advance to avoid a fee.  Refer to the educational materials which you have been given on: General Risks, I had my Procedure. Discharge Instructions, Post Sedation.  Post Procedure Instructions:  The drugs you were given will stay in your system until tomorrow, so for the next 24 hours you should not drive, make any legal decisions or drink any alcoholic beverages.  You may eat anything you prefer, but it is better to start with liquids then soups and crackers, and gradually work up to solid foods.  Please notify your doctor immediately if you have any unusual bleeding, trouble breathing or pain that is not related to your normal pain.  Depending on the type of procedure that was done, some parts of your body may feel week and/or numb.  This usually clears up by tonight or the next day.  Walk with the use of an assistive device or accompanied by an adult for the 24 hours.  You may use ice on the affected area for the first 24 hours.  Put ice in a Ziploc bag and cover with a towel and place against area 15 minutes on 15 minutes off.  You may switch to heat after 24 hours.GENERAL RISKS AND COMPLICATIONS  What are the risk, side effects and possible complications? Generally speaking, most procedures are safe.  However, with any procedure there are risks, side effects, and the possibility of complications.  The risks and complications are dependent upon the sites that are lesioned, or the type of nerve block to be performed.  The closer the procedure is to the spine,  the more serious the risks are.  Great care is taken when placing the radio frequency needles, block needles or lesioning probes, but sometimes complications can occur. 1. Infection: Any time there is an injection through the skin, there is a risk of infection.  This is why sterile conditions are used for these blocks.  There are four possible types of infection. 1. Localized skin infection. 2. Central Nervous System Infection-This can be in the form of Meningitis, which can be deadly. 3. Epidural Infections-This can be in the form of an epidural abscess, which can cause pressure inside of the spine, causing compression of the spinal cord with subsequent paralysis. This would require an emergency surgery to decompress, and there are no guarantees that the patient would recover from the paralysis. 4. Discitis-This is an infection of the intervertebral discs.  It occurs in about 1% of discography procedures.  It is difficult to treat and it may lead to surgery.        2. Pain: the needles have to go through skin and soft tissues, will cause soreness.       3. Damage to internal structures:  The nerves to be lesioned may be near blood vessels or    other nerves which can be potentially damaged.       4. Bleeding: Bleeding is more common if the patient is taking blood thinners such as  aspirin, Coumadin, Ticiid, Plavix, etc., or if he/she have some genetic predisposition  such as   hemophilia. Bleeding into the spinal canal can cause compression of the spinal  cord with subsequent paralysis.  This would require an emergency surgery to  decompress and there are no guarantees that the patient would recover from the  paralysis.       5. Pneumothorax:  Puncturing of a lung is a possibility, every time a needle is introduced in  the area of the chest or upper back.  Pneumothorax refers to free air around the  collapsed lung(s), inside of the thoracic cavity (chest cavity).  Another two possible  complications  related to a similar event would include: Hemothorax and Chylothorax.   These are variations of the Pneumothorax, where instead of air around the collapsed  lung(s), you may have blood or chyle, respectively.       6. Spinal headaches: They may occur with any procedures in the area of the spine.       7. Persistent CSF (Cerebro-Spinal Fluid) leakage: This is a rare problem, but may occur  with prolonged intrathecal or epidural catheters either due to the formation of a fistulous  track or a dural tear.       8. Nerve damage: By working so close to the spinal cord, there is always a possibility of  nerve damage, which could be as serious as a permanent spinal cord injury with  paralysis.       9. Death:  Although rare, severe deadly allergic reactions known as "Anaphylactic  reaction" can occur to any of the medications used.      10. Worsening of the symptoms:  We can always make thing worse.  What are the chances of something like this happening? Chances of any of this occuring are extremely low.  By statistics, you have more of a chance of getting killed in a motor vehicle accident: while driving to the hospital than any of the above occurring .  Nevertheless, you should be aware that they are possibilities.  In general, it is similar to taking a shower.  Everybody knows that you can slip, hit your head and get killed.  Does that mean that you should not shower again?  Nevertheless always keep in mind that statistics do not mean anything if you happen to be on the wrong side of them.  Even if a procedure has a 1 (one) in a 1,000,000 (million) chance of going wrong, it you happen to be that one..Also, keep in mind that by statistics, you have more of a chance of having something go wrong when taking medications.  Who should not have this procedure? If you are on a blood thinning medication (e.g. Coumadin, Plavix, see list of "Blood Thinners"), or if you have an active infection going on, you should not  have the procedure.  If you are taking any blood thinners, please inform your physician.  How should I prepare for this procedure?  Do not eat or drink anything at least six hours prior to the procedure.  Bring a driver with you .  It cannot be a taxi.  Come accompanied by an adult that can drive you back, and that is strong enough to help you if your legs get weak or numb from the local anesthetic.  Take all of your medicines the morning of the procedure with just enough water to swallow them.  If you have diabetes, make sure that you are scheduled to have your procedure done first thing in the morning, whenever possible.  If you have diabetes,   take only half of your insulin dose and notify our nurse that you have done so as soon as you arrive at the clinic.  If you are diabetic, but only take blood sugar pills (oral hypoglycemic), then do not take them on the morning of your procedure.  You may take them after you have had the procedure.  Do not take aspirin or any aspirin-containing medications, at least eleven (11) days prior to the procedure.  They may prolong bleeding.  Wear loose fitting clothing that may be easy to take off and that you would not mind if it got stained with Betadine or blood.  Do not wear any jewelry or perfume  Remove any nail coloring.  It will interfere with some of our monitoring equipment.  NOTE: Remember that this is not meant to be interpreted as a complete list of all possible complications.  Unforeseen problems may occur.  BLOOD THINNERS The following drugs contain aspirin or other products, which can cause increased bleeding during surgery and should not be taken for 2 weeks prior to and 1 week after surgery.  If you should need take something for relief of minor pain, you may take acetaminophen which is found in Tylenol,m Datril, Anacin-3 and Panadol. It is not blood thinner. The products listed below are.  Do not take any of the products listed below  in addition to any listed on your instruction sheet.  A.P.C or A.P.C with Codeine Codeine Phosphate Capsules #3 Ibuprofen Ridaura  ABC compound Congesprin Imuran rimadil  Advil Cope Indocin Robaxisal  Alka-Seltzer Effervescent Pain Reliever and Antacid Coricidin or Coricidin-D  Indomethacin Rufen  Alka-Seltzer plus Cold Medicine Cosprin Ketoprofen S-A-C Tablets  Anacin Analgesic Tablets or Capsules Coumadin Korlgesic Salflex  Anacin Extra Strength Analgesic tablets or capsules CP-2 Tablets Lanoril Salicylate  Anaprox Cuprimine Capsules Levenox Salocol  Anexsia-D Dalteparin Magan Salsalate  Anodynos Darvon compound Magnesium Salicylate Sine-off  Ansaid Dasin Capsules Magsal Sodium Salicylate  Anturane Depen Capsules Marnal Soma  APF Arthritis pain formula Dewitt's Pills Measurin Stanback  Argesic Dia-Gesic Meclofenamic Sulfinpyrazone  Arthritis Bayer Timed Release Aspirin Diclofenac Meclomen Sulindac  Arthritis pain formula Anacin Dicumarol Medipren Supac  Analgesic (Safety coated) Arthralgen Diffunasal Mefanamic Suprofen  Arthritis Strength Bufferin Dihydrocodeine Mepro Compound Suprol  Arthropan liquid Dopirydamole Methcarbomol with Aspirin Synalgos  ASA tablets/Enseals Disalcid Micrainin Tagament  Ascriptin Doan's Midol Talwin  Ascriptin A/D Dolene Mobidin Tanderil  Ascriptin Extra Strength Dolobid Moblgesic Ticlid  Ascriptin with Codeine Doloprin or Doloprin with Codeine Momentum Tolectin  Asperbuf Duoprin Mono-gesic Trendar  Aspergum Duradyne Motrin or Motrin IB Triminicin  Aspirin plain, buffered or enteric coated Durasal Myochrisine Trigesic  Aspirin Suppositories Easprin Nalfon Trillsate  Aspirin with Codeine Ecotrin Regular or Extra Strength Naprosyn Uracel  Atromid-S Efficin Naproxen Ursinus  Auranofin Capsules Elmiron Neocylate Vanquish  Axotal Emagrin Norgesic Verin  Azathioprine Empirin or Empirin with Codeine Normiflo Vitamin E  Azolid Emprazil Nuprin Voltaren  Bayer  Aspirin plain, buffered or children's or timed BC Tablets or powders Encaprin Orgaran Warfarin Sodium  Buff-a-Comp Enoxaparin Orudis Zorpin  Buff-a-Comp with Codeine Equegesic Os-Cal-Gesic   Buffaprin Excedrin plain, buffered or Extra Strength Oxalid   Bufferin Arthritis Strength Feldene Oxphenbutazone   Bufferin plain or Extra Strength Feldene Capsules Oxycodone with Aspirin   Bufferin with Codeine Fenoprofen Fenoprofen Pabalate or Pabalate-SF   Buffets II Flogesic Panagesic   Buffinol plain or Extra Strength Florinal or Florinal with Codeine Panwarfarin   Buf-Tabs Flurbiprofen Penicillamine   Butalbital Compound Four-way cold tablets   Penicillin   Butazolidin Fragmin Pepto-Bismol   Carbenicillin Geminisyn Percodan   Carna Arthritis Reliever Geopen Persantine   Carprofen Gold's salt Persistin   Chloramphenicol Goody's Phenylbutazone   Chloromycetin Haltrain Piroxlcam   Clmetidine heparin Plaquenil   Cllnoril Hyco-pap Ponstel   Clofibrate Hydroxy chloroquine Propoxyphen         Before stopping any of these medications, be sure to consult the physician who ordered them.  Some, such as Coumadin (Warfarin) are ordered to prevent or treat serious conditions such as "deep thrombosis", "pumonary embolisms", and other heart problems.  The amount of time that you may need off of the medication may also vary with the medication and the reason for which you were taking it.  If you are taking any of these medications, please make sure you notify your pain physician before you undergo any procedures.         Facet Joint Block The facet joints connect the bones of the spine (vertebrae). They make it possible for you to bend, twist, and make other movements with your spine. They also prevent you from overbending, overtwisting, and making other excessive movements.  A facet joint block is a procedure where a numbing medicine (anesthetic) is injected into a facet joint. Often, a type of  anti-inflammatory medicine called a steroid is also injected. A facet joint block may be done for two reasons:  2. Diagnosis. A facet joint block may be done as a test to see whether neck or back pain is caused by a worn-down or infected facet joint. If the pain gets better after a facet joint block, it means the pain is probably coming from the facet joint. If the pain does not get better, it means the pain is probably not coming from the facet joint.  3. Therapy. A facet joint block may be done to relieve neck or back pain caused by a facet joint. A facet joint block is only done as a therapy if the pain does not improve with medicine, exercise programs, physical therapy, and other forms of pain management. LET YOUR HEALTH CARE PROVIDER KNOW ABOUT:   Any allergies you have.   All medicines you are taking, including vitamins, herbs, eyedrops, and over-the-counter medicines and creams.   Previous problems you or members of your family have had with the use of anesthetics.   Any blood disorders you have had.   Other health problems you have. RISKS AND COMPLICATIONS Generally, having a facet joint block is safe. However, as with any procedure, complications can occur. Possible complications associated with having a facet joint block include:   Bleeding.   Injury to a nerve near the injection site.   Pain at the injection site.   Weakness or numbness in areas controlled by nerves near the injection site.   Infection.   Temporary fluid retention.   Allergic reaction to anesthetics or medicines used during the procedure. BEFORE THE PROCEDURE   Follow your health care provider's instructions if you are taking dietary supplements or medicines. You may need to stop taking them or reduce your dosage.   Do not take any new dietary supplements or medicines without asking your health care provider first.   Follow your health care provider's instructions about eating and drinking  before the procedure. You may need to stop eating and drinking several hours before the procedure.   Arrange to have an adult drive you home after the procedure. PROCEDURE 12. You may need to remove your clothing and   dress in an open-back gown so that your health care provider can access your spine.  13. The procedure will be done while you are lying on an X-ray table. Most of the time you will be asked to lie on your stomach, but you may be asked to lie in a different position if an injection will be made in your neck.  14. Special machines will be used to monitor your oxygen levels, heart rate, and blood pressure.  15. If an injection will be made in your neck, an intravenous (IV) tube will be inserted into one of your veins. Fluids and medicine will flow directly into your body through the IV tube.  16. The area over the facet joint where the injection will be made will be cleaned with an antiseptic soap. The surrounding skin will be covered with sterile drapes.  17. An anesthetic will be applied to your skin to make the injection area numb. You may feel a temporary stinging or burning sensation.  18. A video X-ray machine will be used to locate the joint. A contrast dye may be injected into the facet joint area to help with locating the joint.  19. When the joint is located, an anesthetic medicine will be injected into the joint through the needle.  20. Your health care provider will ask you whether you feel pain relief. If you do feel relief, a steroid may be injected to provide pain relief for a longer period of time. If you do not feel relief or feel only partial relief, additional injections of an anesthetic may be made in other facet joints.  21. The needle will be removed, the skin will be cleansed, and bandages will be applied.  AFTER THE PROCEDURE   You will be observed for 15-30 minutes before being allowed to go home. Do not drive. Have an adult drive you or take a taxi or  public transportation instead.   If you feel pain relief, the pain will return in several hours or days when the anesthetic wears off.   You may feel pain relief 2-14 days after the procedure. The amount of time this relief lasts varies from person to person.   It is normal to feel some tenderness over the injected area(s) for 2 days following the procedure.   If you have diabetes, you may have a temporary increase in blood sugar.   This information is not intended to replace advice given to you by your health care provider. Make sure you discuss any questions you have with your health care provider.   Document Released: 02/10/2007 Document Revised: 10/12/2014 Document Reviewed: 07/11/2012 [employer] Interactive Patient Education 2016 [employer]  

## 2015-11-26 NOTE — Progress Notes (Signed)
Safety precautions to be maintained throughout the outpatient stay will include: orient to surroundings, keep bed in low position, maintain call bell within reach at all times, provide assistance with transfer out of bed and ambulation.  

## 2015-11-27 NOTE — Progress Notes (Signed)
Subjective:  Patient ID: [name], male    DOB: [birth year]  Age: 55 y.o. MRN: [id number]  CC: Back Pain   HPI [name] presents for reevaluation today. He was last seen 1 month ago and had his first facet injection at that time. Reports an 85-90% relief with the injection. He's had some mild muscle spasms but he is questioning whether this may be due to the recent initiation of Crestor. Otherwise he tolerated the procedure well. He does have some similar pain on the opposite side of his last injection. He is tolerating his medications without difficulty based on his narcotic assessment sheet.  He also has a history of myasthenia gravis which is currently followed by Dr. Melrose Nakayama. He was also followed by Dr. Maree Erie in [place] for his pain management.  He's had previous MRI and other nerve blocks x-rays nerve conduction studies and a previous neurologic evaluation and this has been requested from his pain management clinic in [place].  History [name] has a past medical history of Asthma; Diabetes mellitus without complication ([place]); GERD (gastroesophageal reflux disease); Hyperlipidemia; Hypertension; Allergy; Lyme disease; Arthritis; Cataract; Myasthenia gravis ([place]); Diabetic peripheral neuropathy ([place]); Insomnia; and Coronary artery disease.   He has past surgical history that includes Cardiac catheterization; Eye surgery (Bilateral, 2012); Tonsillectomy and adenoidectomy; Bilateral carpal tunnel release (Bilateral, L in 2012 and R in 2013); and Coronary angioplasty.   His family history includes Cancer in his father; Diabetes in his mother; Heart disease in his mother.He reports that he has quit smoking. He has never used smokeless tobacco. He reports that he drinks alcohol. He reports that he does not use illicit drugs.   ---------------------------------------------------------------------------------------------------------------------- Past Medical History   Diagnosis Date  . Asthma   . Diabetes mellitus without complication ([place])   . GERD (gastroesophageal reflux disease)   . Hyperlipidemia   . Hypertension   . Allergy     dust, seasonal (worse in the fall).  . Lyme disease     Chronic  . 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 Followed by Pain Specialist in [place]  . Cataract     First Dx in 2012  . Myasthenia gravis ([place] )   . Diabetic peripheral neuropathy ([place])   . Insomnia   . Coronary artery disease     Has 10 stents.    Past Surgical History  Procedure Laterality Date  . Cardiac catheterization      Several Caths, most recent in  March 2016.  [name] Eye surgery Bilateral 2012    cataract  . Tonsillectomy and adenoidectomy      As a child  . Bilateral carpal tunnel release Bilateral L in 2012 and R in 2013  . Coronary angioplasty      Family History  Problem Relation Age of Onset  . Diabetes Mother   . Heart disease Mother   . Cancer Father     Prostate CA    Social History  Substance Use Topics  . Smoking status: Former Research scientist (life sciences)  . Smokeless tobacco: Never Used  . Alcohol Use: 0.0 oz/week    0 Standard drinks or equivalent per week     Comment: occasional    ---------------------------------------------------------------------------------------------------------------------- Social History   Social History  . Marital Status: Married    Spouse Name: N/A  . Number of Children: N/A  . Years of Education: N/A   Social History Main Topics  . Smoking status: Former Research scientist (life sciences)  . Smokeless tobacco: Never Used  . Alcohol  Use: 0.0 oz/week    0 Standard drinks or equivalent per week     Comment: occasional  . Drug Use: No  . Sexual Activity: No   Other Topics Concern  . None   Social History Narrative      ----------------------------------------------------------------------------------------------------------------------  ROS Review of Systems   Cardiac is positive for high blood pressure chest pain previous  heart attack status post stent Pulmonary positive for history of asthma and sleep apnea Neurologic is positive for Lyme disease myasthenia gravis and peripheral neuropathy Psychological is positive for depression Gastrointestinal positive for reflux Endocrine is positive for diabetes   Objective:  BP 133/75 mmHg  Pulse 99  Temp(Src) 98.3 F (36.8 C) (Oral)  Resp 16  Ht 5\' 7"  (1.702 m)  Wt 257 lb (116.574 kg)  BMI 40.24 kg/m2  SpO2 99%  Physical Exam Patient is alert oriented cooperative compliant and a good historian Heart is regular rate and rhythm without murmur Lungs show extremity wheezing worse on the right than left Inspection of his low back continues to reveals significant paraspinous muscle tenderness bilaterally   Assessment & Plan:   [name] was seen today for back pain.  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  Facet arthritis of lumbosacral region we will defer on any repeat injections today with return to clinic in 1 month   2. DDD (degenerative disc disease), lumbar   3 Lyme arthritis of multiple joints ([facility]) We will write for his oxycodone today. He States that this is working well for him in the past and kept his pain under good control. He's been compliant on his regimen and denies diverting or illicit use. We have requested his previous information from his pain management Center in [place]. He'll also be given today  4. Myasthenia gravis ([place]) Continue follow-up with Dr. Melrose Nakayama  5. Cervicalgia I discussed some options with him regarding stretching strengthening exercises for his neck pain. This seems to be stable in nature with no complaints of numbness or tingling affecting the upper extremities no loss of strength noted per patient. If symptoms change we will consider a neurosurgical evaluation and further studies if indicated.    ----------------------------------------------------------------------------------------------------------------------  I am having Mr. Weible maintain his insulin glargine, insulin lispro, pyridostigmine, aspirin, bisoprolol-hydrochlorothiazide, nitroGLYCERIN, Loratadine (CLARITIN PO), albuterol, mometasone-formoterol, hydrochlorothiazide, augmented betamethasone dipropionate, glucose blood, gabapentin, gabapentin, amLODipine,  pantoprazole, zolpidem, rosuvastatin, metFORMIN, cyclobenzaprine, and Oxycodone HCl. We will continue to administer lidocaine (PF), midazolam, ropivacaine (PF) 2 mg/ml (0.2%), sodium chloride flush, and triamcinolone acetonide.   Meds ordered this encounter  Medications  . Oxycodone HCl 10 MG TABS    Sig: Take 1 tablet (10 mg total) by mouth 4 (four) times daily as needed.    Dispense:  120 tablet     Refill:  0    Do not fill until [id number]       Follow-up: Return in about 1 week (around 12/03/2015) for procedure.    P  Molli Barrows, MD

## 2015-12-02 ENCOUNTER — Ambulatory Visit (INDEPENDENT_AMBULATORY_CARE_PROVIDER_SITE_OTHER): Payer: Medicare Other | Admitting: Gastroenterology

## 2015-12-02 ENCOUNTER — Encounter: Payer: Self-pay | Admitting: Gastroenterology

## 2015-12-02 VITALS — BP 126/73 | HR 94 | Temp 98.1°F | Ht 67.0 in | Wt 256.0 lb

## 2015-12-02 DIAGNOSIS — R748 Abnormal levels of other serum enzymes: Secondary | ICD-10-CM

## 2015-12-02 DIAGNOSIS — K219 Gastro-esophageal reflux disease without esophagitis: Secondary | ICD-10-CM | POA: Diagnosis not present

## 2015-12-02 DIAGNOSIS — R160 Hepatomegaly, not elsewhere classified: Secondary | ICD-10-CM | POA: Diagnosis not present

## 2015-12-02 NOTE — Progress Notes (Signed)
Gastroenterology Consultation  Referring Provider:     Roselee Nova, MD Primary Care Physician:  Keith Rake, MD Primary Gastroenterologist:  Dr. Allen Norris     Reason for Consultation:     Heartburn        HPI:   [name] is a 55 y.o. y/o male referred for consultation & management of GERD by Dr. Keith Rake, MD.  This patient comes in today with a history of long-standing heartburn. The patient states he is only being controlled with 40 mg of pantoprazole twice a day. The patient also reports that he is interested in having bariatric surgery. The patient was evaluated in the past for bariatric surgery before he moved here. The patient also reports that he is due for colonoscopy. There is no report of any unexplained weight loss. Is no reports of any fevers chills nausea or vomiting. The patient states that if he misses even one dose of his pantoprazole he has her affect heartburn. There is also a report of dysphagia that he states he is not sure if it's from any esophageal strictures or from his myasthenia gravis. He reports that he had a brother who died of fatty liver disease. There is no report of any black stools or bloody stools.  Past Medical History  Diagnosis Date  . Asthma   . Diabetes mellitus without complication ([place])   . GERD (gastroesophageal reflux disease)   . Hyperlipidemia   . Hypertension   . Allergy     dust, seasonal (worse in the fall).  . Lyme disease     Chronic  . 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 Followed by Pain Specialist in [place]  . Cataract     First Dx in 2012  . Myasthenia gravis ([place])   . Diabetic peripheral neuropathy ([place])   . Insomnia   . Coronary artery disease     Has 10 stents.    Past Surgical History  Procedure Laterality Date  . Cardiac catheterization      Several Caths, most recent in  March 2016.  [name] Eye surgery Bilateral 2012    cataract  . Tonsillectomy and adenoidectomy      As a child  . Bilateral carpal tunnel release  Bilateral L in 2012 and R in 2013  . Coronary angioplasty      Prior to Admission medications   Medication Sig Start Date End Date Taking? Authorizing Provider  albuterol (PROVENTIL) (2.5 MG/3ML) 0.083% nebulizer solution Take 2.5 mg by nebulization every 6 (six) hours as needed for wheezing or shortness of breath.   Yes Historical Provider, MD  amLODipine (NORVASC) 5 MG tablet Take 1 tablet (5 mg total) by mouth daily. 10/08/15  Yes Roselee Nova, MD  aspirin 81 MG tablet Take 81 mg by mouth daily.   Yes Historical Provider, MD  augmented betamethasone dipropionate (DIPROLENE-AF) 0.05 % ointment Apply topically 2 (two) times daily. 08/22/15  Yes Roselee Nova, MD  bisoprolol-hydrochlorothiazide [facility]) 2.5-6.25 MG tablet Take 1 tablet by mouth daily.   Yes Historical Provider, MD  cyanocobalamin 1000 MCG tablet Take 1,000 mcg by mouth daily.   Yes Historical Provider, MD  cyclobenzaprine (FLEXERIL) 10 MG tablet Take 1 tablet (10 mg total) by mouth 3 (three) times daily as needed for muscle spasms. 11/04/15  Yes Molli Barrows, MD  gabapentin (NEURONTIN) 300 MG capsule Take 1 capsule (300 mg total) by mouth 3 (three) times daily. 09/25/15  Yes  Roselee Nova, MD  gabapentin (NEURONTIN) 800 MG tablet Take 1 tablet (800 mg total) by mouth at bedtime. 09/25/15  Yes Roselee Nova, MD  glucose blood (BAYER CONTOUR TEST) test strip Use as instructed 09/03/15  Yes Roselee Nova, MD  insulin glargine (LANTUS) 100 UNIT/ML injection Inject 40 Units into the skin at bedtime.    Yes Historical Provider, MD  Loratadine (CLARITIN PO) Take by mouth.   Yes Historical Provider, MD  metFORMIN (GLUCOPHAGE) 1000 MG tablet Take 1,000 mg by mouth 2 (two) times daily with a meal.   Yes Historical Provider, MD  mometasone-formoterol (DULERA) 100-5 MCG/ACT AERO Inhale 2 puffs into the lungs 2 (two) times daily. Reported on 11/26/2015   Yes Historical Provider, MD  Oxycodone HCl 10 MG TABS Take 1 tablet (10 mg  total) by mouth 4 (four) times daily as needed. 11/26/15  Yes Molli Barrows, MD  pantoprazole (PROTONIX) 40 MG tablet Take 1 tablet (40 mg total) by mouth 2 (two) times daily. 10/08/15  Yes Roselee Nova, MD  pyridostigmine (MESTINON) 60 MG tablet Take 60 mg by mouth 4 (four) times daily as needed.   Yes Historical Provider, MD  rosuvastatin (CRESTOR) 5 MG tablet Take 2.5 mg by mouth daily. 10/22/15 10/21/16 Yes Historical Provider, MD  zolpidem (AMBIEN) 5 MG tablet Take 2 tablets (10 mg total) by mouth at bedtime as needed for sleep. 10/08/15  Yes Roselee Nova, MD  hydrochlorothiazide (HYDRODIURIL) 25 MG tablet Take 1 tablet (25 mg total) by mouth daily. Patient not taking: Reported on 11/26/2015 08/22/15   Roselee Nova, MD  insulin lispro (HUMALOG KWIKPEN) 100 UNIT/ML KiwkPen Inject 8 Units into the skin 3 (three) times daily. Reported on 12/02/2015    Historical Provider, MD  nitroGLYCERIN (NITROSTAT) 0.3 MG SL tablet Place 0.3 mg under the tongue every 5 (five) minutes as needed for chest pain. Reported on 12/02/2015    Historical Provider, MD    Family History  Problem Relation Age of Onset  . Diabetes Mother   . Heart disease Mother   . Cancer Father     Prostate CA     Social History  Substance Use Topics  . Smoking status: Former Research scientist (life sciences)  . Smokeless tobacco: Never Used  . Alcohol Use: 0.0 oz/week    0 Standard drinks or equivalent per week     Comment: occasional    Allergies as of 12/02/2015 - Review Complete 12/02/2015  Allergen Reaction Noted  . Insulin aspart Hives 10/08/2015    Review of Systems:    All systems reviewed and negative except where noted in HPI.   Physical Exam:  BP 126/73 mmHg  Pulse 94  Temp(Src) 98.1 F (36.7 C) (Oral)  Ht 5\' 7"  (1.702 m)  Wt 256 lb (116.121 kg)  BMI 40.09 kg/m2 No LMP for male patient. Psych:  Alert and cooperative. Normal mood and affect. General:   Alert,  Well-developed, obese, Well-nourished, pleasant and cooperative  in NAD Head:  Normocephalic and atraumatic. Eyes:  Sclera clear, no icterus.   Conjunctiva pink. Ears:  Normal auditory acuity. Nose:  No deformity, discharge, or lesions. Mouth:  No deformity or lesions,oropharynx pink & moist. Neck:  Supple; no masses or thyromegaly. Lungs:  Respirations even and unlabored.  Clear throughout to auscultation.   No wheezes, crackles, or rhonchi. No acute distress. Heart:  Regular rate and rhythm; no murmurs, clicks, rubs, or gallops. Abdomen:  Normal bowel  sounds.  No bruits.  Soft, non-tender and non-distended without massesor hernias noted.  There was hepatomegaly noted 4 cm below the costal margin. No guarding or rebound tenderness.  Negative Carnett sign.   Rectal:  Deferred.  Msk:  Symmetrical without gross deformities.  Good, equal movement & strength bilaterally. Pulses:  Normal pulses noted. Extremities:  No clubbing or edema.  No cyanosis. Neurologic:  Alert and oriented x3;  grossly normal neurologically. Skin:  Intact without significant lesions or rashes.  No jaundice. Lymph Nodes:  No significant cervical adenopathy. Psych:  Alert and cooperative. Normal mood and affect.  Imaging Studies: No results found.  Assessment and Plan:   [name] is a 55 y.o. y/o male who comes in today with a history of long-standing heartburn. The patient will be taken off his pantoprazole twice a day and tried on Dexilant once a day. The patient has been given samples to see if this helps. The patient has been told that it does help that he should call for a prescription. The patient was found to have a enlarged liver and he will have an ultrasound with LFTs sent off. The patient will also be set up for colonoscopy because he states he is due for a repeat colonoscopy. The patient has been explained the plan and agrees with it. I have discussed risks & benefits which include, but are not limited to, bleeding, infection, perforation & drug reaction.  The patient  agrees with this plan & written consent will be obtained.      Note: This dictation was prepared with Dragon dictation along with smaller phrase technology. Any transcriptional errors that result from this process are unintentional.

## 2015-12-03 ENCOUNTER — Other Ambulatory Visit: Payer: Self-pay

## 2015-12-03 LAB — HEPATIC FUNCTION PANEL
ALK PHOS: 92 IU/L (ref 39–117)
ALT: 16 IU/L (ref 0–44)
AST: 16 IU/L (ref 0–40)
Albumin: 3.9 g/dL (ref 3.5–5.5)
BILIRUBIN TOTAL: 0.3 mg/dL (ref 0.0–1.2)
BILIRUBIN, DIRECT: 0.09 mg/dL (ref 0.00–0.40)
TOTAL PROTEIN: 6.1 g/dL (ref 6.0–8.5)

## 2015-12-04 ENCOUNTER — Telehealth: Payer: Self-pay

## 2015-12-04 NOTE — Telephone Encounter (Signed)
-----   Message from [name], MD sent at 12/03/2015  6:16 PM EST ----- Let the patient know the liver test was normal.

## 2015-12-04 NOTE — Telephone Encounter (Signed)
Left vm letting pt know his labs were normal.

## 2015-12-06 ENCOUNTER — Ambulatory Visit: Payer: Medicare Other | Admitting: Family Medicine

## 2015-12-09 ENCOUNTER — Ambulatory Visit: Payer: Medicare Other | Admitting: Anesthesiology

## 2015-12-12 ENCOUNTER — Ambulatory Visit: Payer: Medicare Other

## 2015-12-23 ENCOUNTER — Telehealth: Payer: Self-pay | Admitting: Gastroenterology

## 2015-12-23 ENCOUNTER — Encounter: Payer: Self-pay | Admitting: Anesthesiology

## 2015-12-23 ENCOUNTER — Other Ambulatory Visit: Payer: Self-pay

## 2015-12-23 DIAGNOSIS — K2100 Gastro-esophageal reflux disease with esophagitis, without bleeding: Secondary | ICD-10-CM

## 2015-12-23 MED ORDER — DEXLANSOPRAZOLE 60 MG PO CPDR: 60.0000 mg | DELAYED_RELEASE_CAPSULE | Freq: Every day | ORAL | Status: DC

## 2015-12-23 NOTE — Telephone Encounter (Signed)
Patient needs to reschedule colonoscopy. He did call the hospital to cancel. He also needs a RX on some samples that he was given but he doesn't know the name. He is hoping you do. He uses [facility] on [address] [phone] Please call him back.

## 2015-12-23 NOTE — Telephone Encounter (Signed)
Rx for Dexilant sent to [facility], [place] per pt request. Left vm letting him know this was sent and to call back to reschedule his colonoscopy.

## 2015-12-25 ENCOUNTER — Ambulatory Visit: Payer: Medicare Other | Attending: Anesthesiology | Admitting: Anesthesiology

## 2015-12-25 ENCOUNTER — Other Ambulatory Visit: Payer: Self-pay

## 2015-12-25 ENCOUNTER — Encounter: Payer: Self-pay | Admitting: Anesthesiology

## 2015-12-25 VITALS — BP 124/77 | HR 99 | Temp 98.0°F | Resp 16 | Ht 67.0 in | Wt 257.0 lb

## 2015-12-25 DIAGNOSIS — G7000 Myasthenia gravis without (acute) exacerbation: Secondary | ICD-10-CM | POA: Insufficient documentation

## 2015-12-25 DIAGNOSIS — G4700 Insomnia, unspecified: Secondary | ICD-10-CM | POA: Diagnosis not present

## 2015-12-25 DIAGNOSIS — M549 Dorsalgia, unspecified: Secondary | ICD-10-CM | POA: Diagnosis present

## 2015-12-25 DIAGNOSIS — I2510 Atherosclerotic heart disease of native coronary artery without angina pectoris: Secondary | ICD-10-CM | POA: Insufficient documentation

## 2015-12-25 DIAGNOSIS — E1140 Type 2 diabetes mellitus with diabetic neuropathy, unspecified: Secondary | ICD-10-CM | POA: Diagnosis not present

## 2015-12-25 DIAGNOSIS — A6923 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: Secondary | ICD-10-CM | POA: Insufficient documentation

## 2015-12-25 DIAGNOSIS — K219 Gastro-esophageal reflux disease without esophagitis: Secondary | ICD-10-CM | POA: Diagnosis not present

## 2015-12-25 DIAGNOSIS — M5136 Other intervertebral disc degeneration, lumbar region: Secondary | ICD-10-CM

## 2015-12-25 DIAGNOSIS — M47817 Spondylosis without myelopathy or radiculopathy, lumbosacral region: Secondary | ICD-10-CM | POA: Diagnosis not present

## 2015-12-25 DIAGNOSIS — M542 Cervicalgia: Secondary | ICD-10-CM | POA: Insufficient documentation

## 2015-12-25 DIAGNOSIS — E785 Hyperlipidemia, unspecified: Secondary | ICD-10-CM | POA: Diagnosis not present

## 2015-12-25 DIAGNOSIS — J45909 Unspecified asthma, uncomplicated: Secondary | ICD-10-CM | POA: Diagnosis not present

## 2015-12-25 DIAGNOSIS — Z87891 Personal history of nicotine dependence: Secondary | ICD-10-CM | POA: Insufficient documentation

## 2015-12-25 DIAGNOSIS — I10 Essential (primary) hypertension: Secondary | ICD-10-CM | POA: Diagnosis not present

## 2015-12-25 DIAGNOSIS — M47816 Spondylosis without myelopathy or radiculopathy, lumbar region: Secondary | ICD-10-CM | POA: Insufficient documentation

## 2015-12-25 MED ORDER — OXYCODONE HCL 10 MG PO TABS: 10.0000 mg | ORAL_TABLET | Freq: Four times a day (QID) | ORAL | Status: DC | PRN

## 2015-12-25 NOTE — Patient Instructions (Signed)

## 2015-12-25 NOTE — Progress Notes (Signed)
Patient her for medication management.  Patient would like to have procedure if possible.   Safety precautions to be maintained throughout the outpatient stay will include: orient to surroundings, keep bed in low position, maintain call bell within reach at all times, provide assistance with transfer out of bed and ambulation.

## 2015-12-25 NOTE — Telephone Encounter (Signed)
LVM again for pt to return my call to reschedule colonoscopy. Advised him [facility] has already cancelled this there and when he is ready to call back to schedule.

## 2015-12-26 NOTE — Progress Notes (Signed)
Subjective:  Patient ID: [name], male    DOB: 1961-05-05  Age: 55 y.o. MRN: [id number]  CC: Back Pain   HPI [name] presents for reevaluation today. He was last seen 2 months ago and had his first facet injection at that time. Reports an 85-90% relief with the injection. He presents today for reevaluation and desires to proceed with a facet injection however did eat lunch. He is also had a skin burn to his low back from an issue with a heating pad. Otherwise he is doing well with no change in the quality characteristic or distribution of his low back pain. He feels like he has made good progress with the previous facet injection.  He also has a history of myasthenia gravis which is currently followed by Dr. Melrose Nakayama. He was also followed by Dr. Maree Erie in [place] for his pain management.  He's had previous MRI and other nerve blocks x-rays nerve conduction studies and a previous neurologic evaluation and this has been requested from his pain management clinic in [place].  History [name] has a past medical history of Asthma; Diabetes mellitus without complication ([place]); GERD (gastroesophageal reflux disease); Hyperlipidemia; Hypertension; Allergy; Lyme disease; Cataract; Insomnia; Coronary artery disease; Sleep apnea; Headache; Arthritis; Myasthenia gravis ([place]); and Diabetic peripheral neuropathy ([place]).   He has past surgical history that includes Cardiac catheterization; Eye surgery (Bilateral, 2012); Tonsillectomy and adenoidectomy; Bilateral carpal tunnel release (Bilateral, L in 2012 and R in 2013); and Coronary angioplasty.   His family history includes Cancer in his father; Diabetes in his mother; Heart disease in his mother.He reports that he has quit smoking. He has never used smokeless tobacco. He reports that he drinks alcohol. He reports that he does not use illicit drugs.   Procedure list: Facet injection January  2017   ---------------------------------------------------------------------------------------------------------------------- Past Medical History  Diagnosis Date  . Asthma   . Diabetes mellitus without complication ([place])   . GERD (gastroesophageal reflux disease)   . Hyperlipidemia   . Hypertension   . Allergy     dust, seasonal (worse in the fall).  . Lyme disease     Chronic  . Cataract     First Dx in 2012  . Insomnia   . Coronary artery disease     Has 10 stents, last placed 2 year ago  . Sleep apnea     doesn't use CPAP  . Headache     muscle tension  . 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 Followed by Pain Specialist in [place], back and neck  . Myasthenia gravis ([place])     ? Just eyes or systemic  . Diabetic peripheral neuropathy ([place])     feet and hands    Past Surgical History  Procedure Laterality Date  . Cardiac catheterization      Several Caths, most recent in  March 2016.  [name] Eye surgery Bilateral 2012    cataract  . Tonsillectomy and adenoidectomy      As a child  . Bilateral carpal tunnel release Bilateral L in 2012 and R in 2013  . Coronary angioplasty      Family History  Problem Relation Age of Onset  . Diabetes Mother   . Heart disease Mother   . Cancer Father     Prostate CA    Social History  Substance Use Topics  . Smoking status: Former Research scientist (life sciences)  . Smokeless tobacco: Never Used  . Alcohol Use: 0.0 oz/week    0  Standard drinks or equivalent per week     Comment: occasional    ---------------------------------------------------------------------------------------------------------------------- Social History   Social History  . Marital Status: Married    Spouse Name: N/A  . Number of Children: N/A  . Years of Education: N/A   Social History Main Topics  . Smoking status: Former Research scientist (life sciences)  . Smokeless tobacco: Never Used  . Alcohol Use: 0.0 oz/week    0 Standard drinks or equivalent per week     Comment: occasional  . Drug Use: No  .  Sexual Activity: No   Other Topics Concern  . None   Social History Narrative      ----------------------------------------------------------------------------------------------------------------------  ROS Review of Systems   Cardiac is positive for high blood pressure chest pain previous heart attack status post stent Pulmonary positive for history of asthma and sleep apnea Neurologic is positive for Lyme disease myasthenia gravis and peripheral neuropathy Psychological is positive for depression Gastrointestinal positive for reflux Endocrine is positive for diabetes   Objective:  BP 124/77 mmHg  Pulse 99  Temp(Src) 98 F (36.7 C) (Oral)  Resp 16  Ht 5\' 7"  (1.702 m)  Wt 257 lb (116.574 kg)  BMI 40.24 kg/m2  SpO2 97%  Physical Exam Patient is alert oriented cooperative compliant and a good historian Heart is regular rate and rhythm without murmur Lungs show extremity wheezing worse on the right than left Inspection of his low back continues to reveals significant paraspinous muscle tenderness bilaterally   Assessment & Plan:   [name] was seen today for back pain.  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  Facet arthritis of lumbosacral region we will defer on any repeat injections today with return to clinic in 2 weeks  2. DDD (degenerative disc disease), lumbar   3 Lyme arthritis of multiple joints ([facility]) We will write for his oxycodone today. He States that this is working well for him in the past and kept his pain under good control. He's been compliant on  his regimen and denies diverting or illicit use. We have requested his previous information from his pain management Center in [place]. He'll also be given today  4. Myasthenia gravis ([place]) Continue follow-up with Dr. Melrose Nakayama  5. Cervicalgia I discussed some options with him regarding stretching strengthening exercises for his neck pain. This seems to be stable in nature with no complaints of numbness or tingling affecting the upper extremities no loss of strength noted per patient. If symptoms change we will consider a neurosurgical evaluation and further studies if indicated.    ----------------------------------------------------------------------------------------------------------------------  I am having [name] maintain his insulin glargine, insulin lispro, pyridostigmine, aspirin, bisoprolol-hydrochlorothiazide, nitroGLYCERIN, Loratadine (CLARITIN PO), albuterol, mometasone-formoterol, hydrochlorothiazide,  augmented betamethasone dipropionate, glucose blood, gabapentin, gabapentin, amLODipine, pantoprazole, zolpidem, rosuvastatin, metFORMIN, cyclobenzaprine, cyanocobalamin, dexlansoprazole, and Oxycodone HCl. We will continue to administer lidocaine (PF), midazolam, ropivacaine (PF) 2 mg/ml (0.2%), sodium chloride flush, and triamcinolone acetonide.   Meds ordered this encounter  Medications  . Oxycodone HCl 10 MG TABS    Sig: Take 1 tablet (10 mg total) by mouth 4 (four) times daily as needed.    Dispense:  120 tablet    Refill:  0    Do not fill until [id number]       Follow-up: Return in about 2 weeks (around 01/08/2016) for evaluation, procedure.    P  Molli Barrows, MD

## 2015-12-27 ENCOUNTER — Ambulatory Visit: Admit: 2015-12-27 | Payer: Medicare Other | Admitting: Gastroenterology

## 2015-12-27 HISTORY — DX: Headache: R51

## 2015-12-27 HISTORY — DX: Sleep apnea, unspecified: G47.30

## 2015-12-27 HISTORY — DX: Headache, unspecified: R51.9

## 2015-12-27 SURGERY — COLONOSCOPY WITH PROPOFOL
Anesthesia: Choice

## 2016-01-02 ENCOUNTER — Other Ambulatory Visit: Payer: Self-pay | Admitting: Family Medicine

## 2016-01-08 ENCOUNTER — Other Ambulatory Visit: Payer: Self-pay

## 2016-01-08 ENCOUNTER — Encounter: Payer: Self-pay | Admitting: Anesthesiology

## 2016-01-08 DIAGNOSIS — I10 Essential (primary) hypertension: Secondary | ICD-10-CM

## 2016-01-08 MED ORDER — AMLODIPINE BESYLATE 5 MG PO TABS: 5.0000 mg | ORAL_TABLET | Freq: Every day | ORAL | Status: DC

## 2016-01-09 ENCOUNTER — Ambulatory Visit: Payer: Medicare Other | Attending: Anesthesiology | Admitting: Anesthesiology

## 2016-01-09 ENCOUNTER — Encounter: Payer: Self-pay | Admitting: Anesthesiology

## 2016-01-09 DIAGNOSIS — A6920 Lyme disease, unspecified: Secondary | ICD-10-CM | POA: Insufficient documentation

## 2016-01-09 DIAGNOSIS — M47817 Spondylosis without myelopathy or radiculopathy, lumbosacral region: Secondary | ICD-10-CM | POA: Diagnosis not present

## 2016-01-09 DIAGNOSIS — E785 Hyperlipidemia, unspecified: Secondary | ICD-10-CM | POA: Diagnosis not present

## 2016-01-09 DIAGNOSIS — I10 Essential (primary) hypertension: Secondary | ICD-10-CM | POA: Diagnosis not present

## 2016-01-09 DIAGNOSIS — M5136 Other intervertebral disc degeneration, lumbar region: Secondary | ICD-10-CM | POA: Insufficient documentation

## 2016-01-09 DIAGNOSIS — E119 Type 2 diabetes mellitus without complications: Secondary | ICD-10-CM | POA: Diagnosis not present

## 2016-01-09 DIAGNOSIS — M542 Cervicalgia: Secondary | ICD-10-CM | POA: Insufficient documentation

## 2016-01-09 DIAGNOSIS — M549 Dorsalgia, unspecified: Secondary | ICD-10-CM | POA: Diagnosis present

## 2016-01-09 DIAGNOSIS — K219 Gastro-esophageal reflux disease without esophagitis: Secondary | ICD-10-CM | POA: Insufficient documentation

## 2016-01-09 DIAGNOSIS — I2510 Atherosclerotic heart disease of native coronary artery without angina pectoris: Secondary | ICD-10-CM | POA: Diagnosis not present

## 2016-01-09 DIAGNOSIS — G4730 Sleep apnea, unspecified: Secondary | ICD-10-CM | POA: Insufficient documentation

## 2016-01-09 DIAGNOSIS — I252 Old myocardial infarction: Secondary | ICD-10-CM | POA: Diagnosis not present

## 2016-01-09 DIAGNOSIS — J45909 Unspecified asthma, uncomplicated: Secondary | ICD-10-CM | POA: Insufficient documentation

## 2016-01-09 DIAGNOSIS — F329 Major depressive disorder, single episode, unspecified: Secondary | ICD-10-CM | POA: Diagnosis not present

## 2016-01-09 DIAGNOSIS — M4687 Other specified inflammatory spondylopathies, lumbosacral region: Secondary | ICD-10-CM | POA: Insufficient documentation

## 2016-01-09 DIAGNOSIS — G7000 Myasthenia gravis without (acute) exacerbation: Secondary | ICD-10-CM | POA: Diagnosis not present

## 2016-01-09 MED ORDER — ROPIVACAINE HCL 2 MG/ML IJ SOLN
INTRAMUSCULAR | Status: AC
  Administered 2016-01-09: 13:00:00
  Filled 2016-01-09: qty 10

## 2016-01-09 MED ORDER — SODIUM CHLORIDE 0.9% FLUSH: 10.0000 mL | Freq: Once | INTRAVENOUS | Status: DC

## 2016-01-09 MED ORDER — LIDOCAINE HCL (PF) 1 % IJ SOLN: 5.0000 mL | Freq: Once | INTRAMUSCULAR | Status: DC

## 2016-01-09 MED ORDER — TRIAMCINOLONE ACETONIDE 40 MG/ML IJ SUSP
INTRAMUSCULAR | Status: AC
  Administered 2016-01-09: 13:00:00
  Filled 2016-01-09: qty 2

## 2016-01-09 MED ORDER — ROPIVACAINE HCL 2 MG/ML IJ SOLN: 10.0000 mL | Freq: Once | INTRAMUSCULAR | Status: DC

## 2016-01-09 MED ORDER — TRIAMCINOLONE ACETONIDE 40 MG/ML IJ SUSP: 40.0000 mg | Freq: Once | INTRAMUSCULAR | Status: DC

## 2016-01-09 MED ORDER — OXYCODONE HCL 10 MG PO TABS: 10.0000 mg | ORAL_TABLET | Freq: Four times a day (QID) | ORAL | Status: DC | PRN

## 2016-01-09 NOTE — Patient Instructions (Signed)
Facet Blocks Patient Information  Description: The facets are joints in the spine between the vertebrae.  Like any joints in the body, facets can become irritated and painful.  Arthritis can also effect the facets.  By injecting steroids and local anesthetic in and around these joints, we can temporarily block the nerve supply to them.  Steroids act directly on irritated nerves and tissues to reduce selling and inflammation which often leads to decreased pain.  Facet blocks may be done anywhere along the spine from the neck to the low back depending upon the location of your pain.   After numbing the skin with local anesthetic (like Novocaine), a small needle is passed onto the facet joints under x-ray guidance.  You may experience a sensation of pressure while this is being done.  The entire block usually lasts about 15-25 minutes.   Conditions which may be treated by facet blocks:   Low back/buttock pain  Neck/shoulder pain  Certain types of headaches  Preparation for the injection:  1. Do not eat any solid food or dairy products within 8 hours of your appointment. 2. You may drink clear liquid up to 3 hours before appointment.  Clear liquids include water, black coffee, juice or soda.  No milk or cream please. 3. You may take your regular medication, including pain medications, with a sip of water before your appointment.  Diabetics should hold regular insulin (if taken separately) and take 1/2 normal NPH dose the morning of the procedure.  Carry some sugar containing items with you to your appointment. 4. A driver must accompany you and be prepared to drive you home after your procedure. 5. Bring all your current medications with you. 6. An IV may be inserted and sedation may be given at the discretion of the physician. 7. A blood pressure cuff, EKG and other monitors will often be applied during the procedure.  Some patients may need to have extra oxygen administered for a short  period. 8. You will be asked to provide medical information, including your allergies and medications, prior to the procedure.  We must know immediately if you are taking blood thinners (like Coumadin/Warfarin) or if you are allergic to IV iodine contrast (dye).  We must know if you could possible be pregnant.  Possible side-effects:   Bleeding from needle site  Infection (rare, may require surgery)  Nerve injury (rare)  Numbness & tingling (temporary)  Difficulty urinating (rare, temporary)  Spinal headache (a headache worse with upright posture)  Light-headedness (temporary)  Pain at injection site (serveral days)  Decreased blood pressure (rare, temporary)  Weakness in arm/leg (temporary)  Pressure sensation in back/neck (temporary)   Call if you experience:   Fever/chills associated with headache or increased back/neck pain  Headache worsened by an upright position  New onset, weakness or numbness of an extremity below the injection site  Hives or difficulty breathing (go to the emergency room)  Inflammation or drainage at the injection site(s)  Severe back/neck pain greater than usual  New symptoms which are concerning to you  Please note:  Although the local anesthetic injected can often make your back or neck feel good for several hours after the injection, the pain will likely return. It takes 3-7 days for steroids to work.  You may not notice any pain relief for at least one week.  If effective, we will often do a series of 2-3 injections spaced 3-6 weeks apart to maximally decrease your pain.  After the initial   series, you may be a candidate for a more permanent nerve block of the facets.  If you have any questions, please call [phone] [facility]  Pain Management Discharge Instructions  General Discharge Instructions :  If you need to reach your doctor call: Monday-Friday 8:00 am - 4:00 pm at [phone] or  toll free [phone].  After clinic hours [phone] to have operator reach doctor.  Bring all of your medication bottles to all your appointments in the pain clinic.  To cancel or reschedule your appointment with Pain Management please remember to call 24 hours in advance to avoid a fee.  Refer to the educational materials which you have been given on: General Risks, I had my Procedure. Discharge Instructions, Post Sedation.  Post Procedure Instructions:  The drugs you were given will stay in your system until tomorrow, so for the next 24 hours you should not drive, make any legal decisions or drink any alcoholic beverages.  You may eat anything you prefer, but it is better to start with liquids then soups and crackers, and gradually work up to solid foods.  Please notify your doctor immediately if you have any unusual bleeding, trouble breathing or pain that is not related to your normal pain.  Depending on the type of procedure that was done, some parts of your body may feel week and/or numb.  This usually clears up by tonight or the next day.  Walk with the use of an assistive device or accompanied by an adult for the 24 hours.  You may use ice on the affected area for the first 24 hours.  Put ice in a Ziploc bag and cover with a towel and place against area 15 minutes on 15 minutes off.  You may switch to heat after 24 hours. 

## 2016-01-09 NOTE — Progress Notes (Signed)
Subjective:  Patient ID: [name], male    DOB: 1960/11/10  Age: 55 y.o. MRN: [id number]  CC: Back Pain  Procedure: Left side lumbar facet injection at L23 L3-4 L4-5 and L5-S1 and S1 with intra-articular injection at L5-S1 left side under fluoroscopic guidance without sedation  HPI [name] presents for reevaluation today.Procedure list: Facet injection January 2017 and subsequent follow-up in March. He states that he did quite well with his last facet injection urine proximally 80% improvement for 6-8 weeks. He presents today for a repeat injection with similar symptoms as compared to his initial presentation. Otherwise he is in his usual state of health taking his medications as prescribed with no evidence of diverting or illicit use.   ---------------------------------------------------------------------------------------------------------------------- Past Medical History  Diagnosis Date  . GERD (gastroesophageal reflux disease)   . Hyperlipidemia   . Allergy     dust, seasonal (worse in the fall).  . Lyme disease     Chronic  . Cataract     First Dx in 2012  . Insomnia   . Headache     muscle tension  . Myasthenia gravis ([place])     ? Just eyes or systemic  . Hypertension     CONTROLLED ON MEDS  . Anginal pain ([facility])     1 OR 2 x IN 3 MONTHS  . Myocardial infarction ([place]) 2010  . Asthma     BRONCHITIS  . Sleep apnea     CPAP  . Shortness of breath dyspnea     EXERTION  . 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 Followed by Pain Specialist in [place], back and neck  . Myasthenia gravis ([place])   . Diabetes mellitus without complication ([place])     TYPE 2  . Diabetic peripheral neuropathy ([facility])     feet and hands  . Seasonal allergies   . Coronary artery disease     Has 10 stents, last placed 2 year ago, Dr Karle Starch in January    Past Surgical History  Procedure Laterality Date  . Tonsillectomy and adenoidectomy      As a child  . Bilateral carpal  tunnel release Bilateral L in 2012 and R in 2013  . Cardiac catheterization      Several Caths, most recent in  March 2016.  [name] Coronary angioplasty    . Eye surgery Bilateral 2012    cataract/bilateral vitrectomies  . Tunneled venous catheter placement      removed    Family History  Problem Relation Age of Onset  . Diabetes Mother   . Heart disease Mother   . Cancer Father     Prostate CA    Social History  Substance Use Topics  . Smoking status: Never Smoker   . Smokeless tobacco: Never Used  . Alcohol Use: 0.0 oz/week    0 Standard drinks or equivalent per week     Comment: occasional/1-2 BEERS PER YEAR    ---------------------------------------------------------------------------------------------------------------------- Social History   Social History  . Marital Status: Married    Spouse Name: N/A  . Number of Children: N/A  . Years of Education: N/A   Social History Main Topics  . Smoking status: Never Smoker   . Smokeless tobacco: Never Used  . Alcohol Use: 0.0 oz/week    0 Standard drinks or equivalent per week     Comment: occasional/1-2 BEERS PER YEAR  . Drug Use: No  . Sexual Activity: No   Other Topics Concern  . None  Social History Narrative      ----------------------------------------------------------------------------------------------------------------------  ROS Review of Systems   Cardiac is positive for high blood pressure chest pain previous heart attack status post stent Pulmonary positive for history of asthma and sleep apnea Neurologic is positive for Lyme disease myasthenia gravis and peripheral neuropathy Psychological is positive for depression Gastrointestinal positive for reflux Endocrine is positive for diabetes   Objective:  BP 162/92 mmHg  Pulse 95  Temp(Src) 98 F (36.7 C) (Oral)  Resp 16  Ht 5\' 7"  (1.702 m)  Wt 257 lb (116.574 kg)  BMI 40.24 kg/m2  SpO2 95%  Physical Exam Patient is alert oriented  cooperative compliant and a good historian Heart is regular rate and rhythm without murmur Lungs show extremity wheezing worse on the right than left Inspection of his low back continues to reveals significant paraspinous muscle tenderness bilaterally he also has pain on extension with left lateral rotation.   Assessment & Plan:   [name] was seen today for back pain.  Diagnoses and all orders for this visit:  Facet arthritis of lumbosacral region -     triamcinolone acetonide (KENALOG-40) injection 40 mg; 1 mL (40 mg total) by Other route once. -     sodium chloride flush (NS) 0.9 % injection 10 mL; 10 mLs by Other route once. -     ropivacaine (PF) 2 mg/ml (0.2%) (NAROPIN) epidural 10 mL; 10 mLs by Epidural route once. -     lidocaine (PF) (XYLOCAINE) 1 % injection 5 mL; Inject 5 mLs into the skin once.  Other orders -     ropivacaine (PF) 2 mg/ml (0.2%) (NAROPIN) 2 MG/ML epidural;  -     triamcinolone acetonide (KENALOG-40) 40 MG/ML injection;  -     Oxycodone HCl 10 MG TABS; Take 1 tablet (10 mg total) by mouth 4 (four) times daily as needed. -     LUMBAR FACET(MEDIAL BRANCH NERVE BLOCK) MBNB; Future     ----------------------------------------------------------------------------------------------------------------------  Problem List Items Addressed This Visit    None    Visit Diagnoses    Facet arthritis of lumbosacral region        Relevant Medications    triamcinolone acetonide (KENALOG-40) 40 MG/ML injection (Completed)    Oxycodone HCl 10 MG TABS    triamcinolone acetonide (KENALOG-40) injection 40 mg    sodium chloride flush (NS) 0.9 % injection 10 mL    ropivacaine (PF) 2 mg/ml (0.2%) (NAROPIN) epidural 10 mL    lidocaine (PF) (XYLOCAINE) 1 % injection 5 mL       ----------------------------------------------------------------------------------------------------------------------  1.  Facet arthritis of lumbosacral region we will proceed with a lumbar facet  block on the left side for therapeutic purpose with return to clinic in 1 month for reevaluation and possible repeat injection at that time.  He is to continue his current medication regimen with refills given today.  2. 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 Myasthenia gravis ([place]) Continue follow-up with Dr. Melrose Nakayama  5. Cervicalgia I discussed some options with him regarding stretching strengthening exercises for his neck pain. This seems to be stable in nature with no complaints of numbness or tingling affecting the upper extremities no loss of strength noted per patient. If symptoms change we will consider a neurosurgical evaluation and further studies if indicated.    ----------------------------------------------------------------------------------------------------------------------  I am having Mr. Kontz maintain his insulin glargine, insulin lispro, pyridostigmine, bisoprolol-hydrochlorothiazide, nitroGLYCERIN, Loratadine (CLARITIN PO), albuterol, augmented betamethasone  dipropionate, glucose blood, gabapentin, gabapentin, pantoprazole, zolpidem, rosuvastatin, metFORMIN, cyclobenzaprine, cyanocobalamin, dexlansoprazole, aspirin, fluticasone-salmeterol, albuterol, amLODipine, and Oxycodone HCl. We administered ropivacaine (PF) 2 mg/ml (0.2%) and triamcinolone acetonide. We will continue to administer lidocaine (PF), midazolam, ropivacaine (PF) 2 mg/ml (0.2%), sodium chloride flush, triamcinolone acetonide, triamcinolone acetonide, sodium chloride flush, ropivacaine (PF) 2 mg/ml (0.2%), and lidocaine (PF).   Meds ordered this encounter  Medications  . ropivacaine (PF) 2 mg/ml (0.2%) (NAROPIN) 2 MG/ML epidural    Sig:     TICE, KORI: cabinet override  . triamcinolone acetonide (KENALOG-40) 40 MG/ML injection    Sig:     TICE, KORI: cabinet override  . Oxycodone HCl 10 MG TABS    Sig: Take 1 tablet (10 mg total) by mouth 4 (four)  times daily as needed.    Dispense:  120 tablet    Refill:  0    Do not fill until [id number]  . triamcinolone acetonide (KENALOG-40) injection 40 mg    Sig:   . sodium chloride flush (NS) 0.9 % injection 10 mL    Sig:   . ropivacaine (PF) 2 mg/ml (0.2%) (NAROPIN) epidural 10 mL    Sig:   . lidocaine (PF) (XYLOCAINE) 1 % injection 5 mL    Sig:        Follow-up: Return in about 1 month (around 02/08/2016) for procedure, med refill.   Procedure note:  Patient was taken to the fluoroscopy suite and placed in prone position.  Vital signs are stable throughout the procedure. The  overlying area of skin on the  left side side was prepped with Betadine 3 and strict aseptic technique was utilized throughout the procedure. Flouroscopy was used to I identify the areas overlying the aforementioned facets at L2-3 L3-4 or L4-5 L5-S1. 1% lidocaine 1 cc was infiltrated subcutaneously and into the fascia with a 25-gauge needle at each of these sites. I then advanced a 22-gauge 3-1/2 inch [place]ckie needle with the needle tip to lie at the "[facility]" portion of the [employer] dog". There was negative aspiration for heme or CSF and  no paresthesia. I then injected 2 cc of ropivacaine 0.2% mixed with 8 mg of triamcinolone at each of the aforementioned sites. I was able to inject 2 cc of the same mixture in an intra-articular approach at L5-S1 left These needles were withdrawn and the L5-S1 needle was redirected towards the S1 posterior foramen. This was once again no paresthesia, negative aspiration and 2 cc of this same mixture was injected at this site. The patient was convalesced discharged home stable condition for follow-up as mentioned.  P  Molli Barrows, MD

## 2016-01-10 ENCOUNTER — Encounter
Admission: RE | Disposition: A | Discharge status: Home / Self Care | Payer: Self-pay | Source: Ambulatory Visit | Attending: Gastroenterology

## 2016-01-10 ENCOUNTER — Telehealth: Payer: Self-pay

## 2016-01-10 ENCOUNTER — Ambulatory Visit
Admission: RE | Admit: 2016-01-10 | Discharge: 2016-01-10 | Disposition: A | Discharge status: Home / Self Care | Payer: Medicare Other | Source: Ambulatory Visit | Attending: Gastroenterology | Admitting: Gastroenterology

## 2016-01-10 ENCOUNTER — Encounter: Payer: Self-pay | Admitting: Anesthesiology

## 2016-01-10 ENCOUNTER — Telehealth: Payer: Self-pay | Admitting: *Deleted

## 2016-01-10 ENCOUNTER — Encounter: Payer: Self-pay | Admitting: *Deleted

## 2016-01-10 DIAGNOSIS — D123 Benign neoplasm of transverse colon: Secondary | ICD-10-CM | POA: Diagnosis not present

## 2016-01-10 DIAGNOSIS — Z8042 Family history of malignant neoplasm of prostate: Secondary | ICD-10-CM | POA: Insufficient documentation

## 2016-01-10 DIAGNOSIS — G7000 Myasthenia gravis without (acute) exacerbation: Secondary | ICD-10-CM | POA: Diagnosis not present

## 2016-01-10 DIAGNOSIS — G4700 Insomnia, unspecified: Secondary | ICD-10-CM | POA: Diagnosis not present

## 2016-01-10 DIAGNOSIS — J3089 Other allergic rhinitis: Secondary | ICD-10-CM | POA: Diagnosis not present

## 2016-01-10 DIAGNOSIS — Z888 Allergy status to other drugs, medicaments and biological substances status: Secondary | ICD-10-CM | POA: Diagnosis not present

## 2016-01-10 DIAGNOSIS — I252 Old myocardial infarction: Secondary | ICD-10-CM | POA: Insufficient documentation

## 2016-01-10 DIAGNOSIS — Z833 Family history of diabetes mellitus: Secondary | ICD-10-CM | POA: Diagnosis not present

## 2016-01-10 DIAGNOSIS — Z8249 Family history of ischemic heart disease and other diseases of the circulatory system: Secondary | ICD-10-CM | POA: Insufficient documentation

## 2016-01-10 DIAGNOSIS — Z1211 Encounter for screening for malignant neoplasm of colon: Secondary | ICD-10-CM

## 2016-01-10 DIAGNOSIS — R12 Heartburn: Secondary | ICD-10-CM | POA: Diagnosis not present

## 2016-01-10 DIAGNOSIS — K2970 Gastritis, unspecified, without bleeding: Secondary | ICD-10-CM | POA: Insufficient documentation

## 2016-01-10 DIAGNOSIS — I2510 Atherosclerotic heart disease of native coronary artery without angina pectoris: Secondary | ICD-10-CM | POA: Diagnosis not present

## 2016-01-10 DIAGNOSIS — D124 Benign neoplasm of descending colon: Secondary | ICD-10-CM | POA: Diagnosis not present

## 2016-01-10 DIAGNOSIS — Z8661 Personal history of infections of the central nervous system: Secondary | ICD-10-CM | POA: Insufficient documentation

## 2016-01-10 DIAGNOSIS — Z79899 Other long term (current) drug therapy: Secondary | ICD-10-CM | POA: Insufficient documentation

## 2016-01-10 DIAGNOSIS — G4730 Sleep apnea, unspecified: Secondary | ICD-10-CM | POA: Insufficient documentation

## 2016-01-10 DIAGNOSIS — M1990 Unspecified osteoarthritis, unspecified site: Secondary | ICD-10-CM | POA: Diagnosis not present

## 2016-01-10 DIAGNOSIS — I10 Essential (primary) hypertension: Secondary | ICD-10-CM | POA: Insufficient documentation

## 2016-01-10 DIAGNOSIS — K219 Gastro-esophageal reflux disease without esophagitis: Secondary | ICD-10-CM | POA: Insufficient documentation

## 2016-01-10 DIAGNOSIS — I209 Angina pectoris, unspecified: Secondary | ICD-10-CM | POA: Diagnosis not present

## 2016-01-10 DIAGNOSIS — E785 Hyperlipidemia, unspecified: Secondary | ICD-10-CM | POA: Diagnosis not present

## 2016-01-10 DIAGNOSIS — Z7982 Long term (current) use of aspirin: Secondary | ICD-10-CM | POA: Insufficient documentation

## 2016-01-10 DIAGNOSIS — Z9842 Cataract extraction status, left eye: Secondary | ICD-10-CM | POA: Insufficient documentation

## 2016-01-10 DIAGNOSIS — E1142 Type 2 diabetes mellitus with diabetic polyneuropathy: Secondary | ICD-10-CM | POA: Insufficient documentation

## 2016-01-10 DIAGNOSIS — Z794 Long term (current) use of insulin: Secondary | ICD-10-CM | POA: Insufficient documentation

## 2016-01-10 DIAGNOSIS — D125 Benign neoplasm of sigmoid colon: Secondary | ICD-10-CM | POA: Insufficient documentation

## 2016-01-10 DIAGNOSIS — Z9841 Cataract extraction status, right eye: Secondary | ICD-10-CM | POA: Insufficient documentation

## 2016-01-10 DIAGNOSIS — Z955 Presence of coronary angioplasty implant and graft: Secondary | ICD-10-CM | POA: Insufficient documentation

## 2016-01-10 DIAGNOSIS — Z7951 Long term (current) use of inhaled steroids: Secondary | ICD-10-CM | POA: Diagnosis not present

## 2016-01-10 DIAGNOSIS — R0602 Shortness of breath: Secondary | ICD-10-CM | POA: Diagnosis not present

## 2016-01-10 HISTORY — DX: Acute myocardial infarction, unspecified: I21.9

## 2016-01-10 HISTORY — DX: Other seasonal allergic rhinitis: J30.2

## 2016-01-10 HISTORY — PX: ESOPHAGOGASTRODUODENOSCOPY (EGD) WITH PROPOFOL: SHX5813

## 2016-01-10 HISTORY — PX: COLONOSCOPY WITH PROPOFOL: SHX5780

## 2016-01-10 LAB — GLUCOSE, CAPILLARY
GLUCOSE-CAPILLARY: 291 mg/dL — AB (ref 65–99)
Glucose-Capillary: 267 mg/dL — ABNORMAL HIGH (ref 65–99)

## 2016-01-10 SURGERY — COLONOSCOPY WITH PROPOFOL
Anesthesia: Choice

## 2016-01-10 SURGERY — COLONOSCOPY WITH PROPOFOL
Anesthesia: General

## 2016-01-10 MED ORDER — SODIUM CHLORIDE 0.9 % IV SOLN
INTRAVENOUS | Status: DC
  Administered 2016-01-10: 1000 mL via INTRAVENOUS

## 2016-01-10 MED ORDER — SODIUM CHLORIDE 0.9 % IV SOLN
INTRAVENOUS | Status: DC | PRN
  Administered 2016-01-10: 14:00:00 via INTRAVENOUS

## 2016-01-10 MED ORDER — FENTANYL CITRATE (PF) 100 MCG/2ML IJ SOLN
INTRAMUSCULAR | Status: DC | PRN
  Administered 2016-01-10: 50 ug via INTRAVENOUS

## 2016-01-10 MED ORDER — PROPOFOL 10 MG/ML IV BOLUS
INTRAVENOUS | Status: DC | PRN
  Administered 2016-01-10: 30 mg via INTRAVENOUS
  Administered 2016-01-10: 80 mg via INTRAVENOUS
  Administered 2016-01-10 (×2): 20 mg via INTRAVENOUS

## 2016-01-10 MED ORDER — PROPOFOL 500 MG/50ML IV EMUL
INTRAVENOUS | Status: DC | PRN
Start: 2016-01-10 — End: 2016-01-10
  Administered 2016-01-10: 120 ug/kg/min via INTRAVENOUS

## 2016-01-10 MED ORDER — MIDAZOLAM HCL 2 MG/2ML IJ SOLN
INTRAMUSCULAR | Status: DC | PRN
  Administered 2016-01-10: 1 mg via INTRAVENOUS

## 2016-01-10 NOTE — Telephone Encounter (Signed)
Left voicemail for patient to call our office if there are any questions or concerns re; procedure on yesterday.  

## 2016-01-10 NOTE — Telephone Encounter (Signed)
Post procedure phone call.  Left message.  

## 2016-01-10 NOTE — Anesthesia Postprocedure Evaluation (Signed)
Anesthesia Post Note  Patient: [name]  Procedure(s) Performed: Procedure(s) (LRB): COLONOSCOPY WITH PROPOFOL (N/A) ESOPHAGOGASTRODUODENOSCOPY (EGD) WITH PROPOFOL (N/A)  Patient location during evaluation: Endoscopy Anesthesia Type: General Level of consciousness: awake and alert Pain management: pain level controlled Vital Signs Assessment: post-procedure vital signs reviewed and stable Respiratory status: spontaneous breathing, nonlabored ventilation, respiratory function stable and patient connected to nasal cannula oxygen Cardiovascular status: blood pressure returned to baseline and stable Postop Assessment: no signs of nausea or vomiting Anesthetic complications: no    Last Vitals:  Filed Vitals:   01/10/16 1441 01/10/16 1451  BP: 123/99 131/94  Pulse: 94 102  Temp:    Resp: 19 15    Last Pain: There were no vitals filed for this visit.               Otilia Kareem S

## 2016-01-10 NOTE — H&P (Signed)
[facility]  [address] [place], [place] [address] Phone: [phone] Fax : [phone]  Primary Care Physician:  Keith Rake, MD Primary Gastroenterologist:  Dr. Allen Norris  Pre-Procedure History & Physical: HPI:  [name] is a 55 y.o. male is here for a screening colonoscopy.   Past Medical History  Diagnosis Date  . GERD (gastroesophageal reflux disease)   . Hyperlipidemia   . Allergy     dust, seasonal (worse in the fall).  . Lyme disease     Chronic  . Cataract     First Dx in 2012  . Insomnia   . Headache     muscle tension  . Myasthenia gravis ([place])     ? Just eyes or systemic  . Hypertension     CONTROLLED ON MEDS  . Anginal pain (HCC)     1 OR 2 x IN 3 MONTHS  . Myocardial infarction ([place]) 2010  . Asthma     BRONCHITIS  . Sleep apnea     CPAP  . Shortness of breath dyspnea     EXERTION  . 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 Followed by Pain Specialist in [place], back and neck  . Myasthenia gravis ([place])   . Diabetes mellitus without complication ([place])     TYPE 2  . Diabetic peripheral neuropathy (HCC)     feet and hands  . Seasonal allergies   . Coronary artery disease     Has 10 stents, last placed 2 year ago, Dr Karle Starch in January    Past Surgical History  Procedure Laterality Date  . Tonsillectomy and adenoidectomy      As a child  . Bilateral carpal tunnel release Bilateral L in 2012 and R in 2013  . Cardiac catheterization      Several Caths, most recent in  March 2016.  [name] Coronary angioplasty    . Eye surgery Bilateral 2012    cataract/bilateral vitrectomies  . Tunneled venous catheter placement      removed    Prior to Admission medications   Medication Sig Start Date End Date Taking? Authorizing Provider  albuterol (PROVENTIL HFA;VENTOLIN HFA) 108 (90 Base) MCG/ACT inhaler Inhale 2 puffs into the lungs every 6 (six) hours as needed for wheezing or shortness of breath.   Yes Historical Provider, MD    amLODipine (NORVASC) 5 MG tablet Take 1 tablet (5 mg total) by mouth daily. am 01/08/16  Yes [name], MD  aspirin 325 MG tablet Take 325 mg by mouth daily. AM   Yes Historical Provider, MD  bisoprolol-hydrochlorothiazide (ZIAC) 2.5-6.25 MG tablet Take 1 tablet by mouth daily. am   Yes Historical Provider, MD  cyanocobalamin 1000 MCG tablet Take 1,000 mcg by mouth daily. am   Yes Historical Provider, MD  cyclobenzaprine (FLEXERIL) 10 MG tablet Take 1 tablet (10 mg total) by mouth 3 (three) times daily as needed for muscle spasms. Patient taking differently: Take 10 mg by mouth 3 (three) times daily.  11/04/15  Yes Molli Barrows, MD  fluticasone-salmeterol (ADVAIR HFA) [phone] MCG/ACT inhaler Inhale 1 puff into the lungs 2 (two) times daily. Reported on 01/09/2016   Yes Historical Provider, MD  gabapentin (NEURONTIN) 300 MG capsule Take 1 capsule (300 mg total) by mouth 3 (three) times daily. 09/25/15  Yes Roselee Nova, MD  insulin glargine (LANTUS) 100 UNIT/ML injection Inject 40 Units into the skin at bedtime.    Yes Historical Provider, MD  insulin  lispro (HUMALOG KWIKPEN) 100 UNIT/ML KiwkPen Inject 8 Units into the skin 3 (three) times daily. Reported on 12/02/2015/ sliding scale   Yes Historical Provider, MD  Loratadine (CLARITIN PO) Take by mouth daily. am   Yes Historical Provider, MD  metFORMIN (GLUCOPHAGE) 1000 MG tablet Take 1,000 mg by mouth 2 (two) times daily with a meal.   Yes Historical Provider, MD  pantoprazole (PROTONIX) 40 MG tablet Take 1 tablet (40 mg total) by mouth 2 (two) times daily. 10/08/15  Yes Roselee Nova, MD  pyridostigmine (MESTINON) 60 MG tablet Take 60 mg by mouth 3 (three) times daily.    Yes Historical Provider, MD  rosuvastatin (CRESTOR) 5 MG tablet Take 2.5 mg by mouth daily at 6 PM.  10/22/15 10/21/16 Yes Historical Provider, MD  zolpidem (AMBIEN) 5 MG tablet Take 2 tablets (10 mg total) by mouth at bedtime as needed for sleep. Patient taking differently:  Take 5 mg by mouth at bedtime as needed for sleep.  10/08/15  Yes Roselee Nova, MD  albuterol (PROVENTIL) (2.5 MG/3ML) 0.083% nebulizer solution Take 2.5 mg by nebulization every 6 (six) hours as needed for wheezing or shortness of breath.    Historical Provider, MD  augmented betamethasone dipropionate (DIPROLENE-AF) 0.05 % ointment Apply topically 2 (two) times daily. 08/22/15   Roselee Nova, MD  dexlansoprazole (DEXILANT) 60 MG capsule Take 1 capsule (60 mg total) by mouth daily. Patient not taking: Reported on 01/09/2016 12/23/15   [name], MD  gabapentin (NEURONTIN) 800 MG tablet Take 1 tablet (800 mg total) by mouth at bedtime. 09/25/15   Roselee Nova, MD  glucose blood (BAYER CONTOUR TEST) test strip Use as instructed 09/03/15   Roselee Nova, MD  nitroGLYCERIN (NITROSTAT) 0.3 MG SL tablet Place 0.3 mg under the tongue every 5 (five) minutes as needed for chest pain. Reported on 12/02/2015    Historical Provider, MD  Oxycodone HCl 10 MG TABS Take 1 tablet (10 mg total) by mouth 4 (four) times daily as needed. 01/09/16   Molli Barrows, MD    Allergies as of 12/25/2015 - Review Complete 12/25/2015  Allergen Reaction Noted  . Novolog [insulin aspart] Hives 10/08/2015    Family History  Problem Relation Age of Onset  . Diabetes Mother   . Heart disease Mother   . Cancer Father     Prostate CA    Social History   Social History  . Marital Status: Married    Spouse Name: N/A  . Number of Children: N/A  . Years of Education: N/A   Occupational History  . Not on file.   Social History Main Topics  . Smoking status: Never Smoker   . Smokeless tobacco: Never Used  . Alcohol Use: 0.0 oz/week    0 Standard drinks or equivalent per week     Comment: occasional/1-2 BEERS PER YEAR  . Drug Use: No  . Sexual Activity: No   Other Topics Concern  . Not on file   Social History Narrative    Review of Systems: See HPI, otherwise negative ROS  Physical Exam: BP  138/92 mmHg  Temp(Src) 97.8 F (36.6 C) (Tympanic)  Resp 18  Ht 5\' 7"  (1.702 m)  Wt 257 lb (116.574 kg)  BMI 40.24 kg/m2 General:   Alert,  pleasant and cooperative in NAD Head:  Normocephalic and atraumatic. Neck:  Supple; no masses or thyromegaly. Lungs:  Clear throughout to auscultation.  Heart:  Regular rate and rhythm. Abdomen:  Soft, nontender and nondistended. Normal bowel sounds, without guarding, and without rebound.   Neurologic:  Alert and  oriented x4;  grossly normal neurologically.  Impression/Plan: [name] is now here to undergo a screening colonoscopy.  Risks, benefits, and alternatives regarding colonoscopy have been reviewed with the patient.  Questions have been answered.  All parties agreeable.

## 2016-01-10 NOTE — Transfer of Care (Signed)
Immediate Anesthesia Transfer of Care Note  Patient: [name]  Procedure(s) Performed: Procedure(s): COLONOSCOPY WITH PROPOFOL (N/A) ESOPHAGOGASTRODUODENOSCOPY (EGD) WITH PROPOFOL (N/A)  Patient Location: PACU and Endoscopy Unit  Anesthesia Type:General  Level of Consciousness: patient cooperative and lethargic  Airway & Oxygen Therapy: Patient Spontanous Breathing and Patient connected to nasal cannula oxygen  Post-op Assessment: Report given to RN and Post -op Vital signs reviewed and stable  Post vital signs: Reviewed and stable  Last Vitals:  Filed Vitals:   01/10/16 1318 01/10/16 1420  BP: 138/92 101/63  Pulse:  88  Temp: 36.6 C 36.4 C  Resp: 18 18    Complications: No apparent anesthesia complications

## 2016-01-10 NOTE — Op Note (Signed)
[facility] Gastroenterology Patient Name: [name] Procedure Date: 01/10/2016 1:51 PM MRN: [id number] Account #: [id number] Date of Birth: 1961-02-05 Admit Type: Outpatient Age: 55 Room: [facility] ENDO ROOM 4 Gender: Male Note Status: Finalized Procedure:            Upper GI endoscopy Indications:          Heartburn Providers:            [name], MD Medicines:            Propofol per Anesthesia Complications:        No immediate complications. Procedure:            Pre-Anesthesia Assessment:                       - Prior to the procedure, a History and Physical was                        performed, and patient medications and allergies were                        reviewed. The patient's tolerance of previous                        anesthesia was also reviewed. The risks and benefits of                        the procedure and the sedation options and risks were                        discussed with the patient. All questions were                        answered, and informed consent was obtained. Prior                        Anticoagulants: The patient has taken no previous                        anticoagulant or antiplatelet agents. ASA Grade                        Assessment: II - A patient with mild systemic disease.                        After reviewing the risks and benefits, the patient was                        deemed in satisfactory condition to undergo the                        procedure.                       After obtaining informed consent, the endoscope was                        passed under direct vision. Throughout the procedure,                        the patient's blood pressure, pulse, and oxygen  saturations were monitored continuously. The Endoscope                        was introduced through the mouth, and advanced to the                        second part of duodenum. The upper GI endoscopy was           accomplished without difficulty. The patient tolerated                        the procedure well. Findings:      The examined esophagus was normal.      Localized minimal inflammation characterized by erythema was found in       the gastric antrum. Biopsies were taken with a cold forceps for       histology.      The examined duodenum was normal. Impression:           - Normal esophagus.                       - Gastritis. Biopsied.                       - Normal examined duodenum. Recommendation:       - Await pathology results.                       - Perform a colonoscopy today. Procedure Code(s):    --- Professional ---                       [phone], Esophagogastroduodenoscopy, flexible, transoral;                        with biopsy, single or multiple Diagnosis Code(s):    --- Professional ---                       R12, Heartburn                       K29.70, Gastritis, unspecified, without bleeding CPT copyright 2016 American Medical Association. All rights reserved. The codes documented in this report are preliminary and upon coder review may  be revised to meet current compliance requirements. [name], MD 01/10/2016 2:00:32 PM This report has been signed electronically. Number of Addenda: 0 Note Initiated On: 01/10/2016 1:51 PM      [facility]

## 2016-01-10 NOTE — Op Note (Signed)
[name] Gastroenterology Patient Name: [name] Procedure Date: 01/10/2016 1:51 PM MRN: [id number] Account #: [id number] Date of Birth: 11/01/1960 Admit Type: Outpatient Age: 55 Room: [facility] ENDO ROOM 4 Gender: Male Note Status: Finalized Procedure:            Colonoscopy Indications:          Screening for colorectal malignant neoplasm Providers:            [name], MD Referring MD:         Otila Back. Manuella Ghazi (Referring MD) Medicines:            Propofol per Anesthesia Complications:        No immediate complications. Procedure:            Pre-Anesthesia Assessment:                       - Prior to the procedure, a History and Physical was                        performed, and patient medications and allergies were                        reviewed. The patient's tolerance of previous                        anesthesia was also reviewed. The risks and benefits of                        the procedure and the sedation options and risks were                        discussed with the patient. All questions were                        answered, and informed consent was obtained. Prior                        Anticoagulants: The patient has taken no previous                        anticoagulant or antiplatelet agents. ASA Grade                        Assessment: II - A patient with mild systemic disease.                        After reviewing the risks and benefits, the patient was                        deemed in satisfactory condition to undergo the                        procedure.                       After obtaining informed consent, the colonoscope was                        passed under direct vision. Throughout the procedure,  the patient's blood pressure, pulse, and oxygen                        saturations were monitored continuously. The Olympus                        CF-H180AL colonoscope ( S#: [id number] ) was introduced            through the anus and advanced to the the cecum,                        identified by appendiceal orifice and ileocecal valve.                        The colonoscopy was performed without difficulty. The                        patient tolerated the procedure well. The quality of                        the bowel preparation was poor. Findings:      The perianal and digital rectal examinations were normal.      A 5 mm polyp was found in the sigmoid colon. The polyp was sessile. The       polyp was removed with a cold biopsy forceps. Resection and retrieval       were complete.      A 4 mm polyp was found in the descending colon. The polyp was sessile.       The polyp was removed with a cold biopsy forceps. Resection and       retrieval were complete.      A 4 mm polyp was found in the transverse colon. The polyp was sessile.       The polyp was removed with a cold biopsy forceps. Resection and       retrieval were complete. Impression:           - Preparation of the colon was poor.                       - One 5 mm polyp in the sigmoid colon, removed with a                        cold biopsy forceps. Resected and retrieved.                       - One 4 mm polyp in the descending colon, removed with                        a cold biopsy forceps. Resected and retrieved.                       - One 4 mm polyp in the transverse colon, removed with                        a cold biopsy forceps. Resected and retrieved. Recommendation:       - Await pathology results.                       - Repeat colonoscopy in 5  years if polyp adenoma and 10                        years if hyperplastic Procedure Code(s):    --- Professional ---                       [phone], Colonoscopy, flexible; with biopsy, single or                        multiple Diagnosis Code(s):    --- Professional ---                       Z12.11, Encounter for screening for malignant neoplasm                        of colon                        D12.5, Benign neoplasm of sigmoid colon                       D12.4, Benign neoplasm of descending colon                       D12.3, Benign neoplasm of transverse colon (hepatic                        flexure or splenic flexure) CPT copyright 2016 American Medical Association. All rights reserved. The codes documented in this report are preliminary and upon coder review may  be revised to meet current compliance requirements. [name], MD 01/10/2016 2:16:01 PM This report has been signed electronically. Number of Addenda: 0 Note Initiated On: 01/10/2016 1:51 PM Scope Withdrawal Time: 0 hours 6 minutes 42 seconds  Total Procedure Duration: 0 hours 11 minutes 8 seconds       [facility]

## 2016-01-10 NOTE — Anesthesia Preprocedure Evaluation (Addendum)
Anesthesia Evaluation  Patient identified by MRN, date of birth, ID band Patient awake    Reviewed: Allergy & Precautions, NPO status , Patient's Chart, lab work & pertinent test results, reviewed documented beta blocker date and time   Airway Mallampati: III  TM Distance: >3 FB     Dental  (+) Chipped   Pulmonary shortness of breath, sleep apnea and Continuous Positive Airway Pressure Ventilation ,           Cardiovascular hypertension, Pt. on medications and Pt. on home beta blockers + angina + CAD and + Past MI       Neuro/Psych    GI/Hepatic GERD  ,  Endo/Other  diabetes, Type 2  Renal/GU      Musculoskeletal  (+) Arthritis ,   Abdominal   Peds  Hematology   Anesthesia Other Findings Obesity. Myasthenia Gravis takes prido times 3. MI with 10 stents. Took albuterol this am. Obese. Allergic to Novolog, but can take humalog.  Reproductive/Obstetrics                            Anesthesia Physical Anesthesia Plan  ASA: III  Anesthesia Plan: General   Post-op Pain Management:    Induction: Intravenous  Airway Management Planned: Nasal Cannula  Additional Equipment:   Intra-op Plan:   Post-operative Plan:   Informed Consent: I have reviewed the patients History and Physical, chart, labs and discussed the procedure including the risks, benefits and alternatives for the proposed anesthesia with the patient or authorized representative who has indicated his/her understanding and acceptance.     Plan Discussed with: CRNA  Anesthesia Plan Comments:         Anesthesia Quick Evaluation

## 2016-01-14 ENCOUNTER — Encounter: Payer: Self-pay | Admitting: Gastroenterology

## 2016-01-14 LAB — SURGICAL PATHOLOGY

## 2016-01-20 ENCOUNTER — Ambulatory Visit (INDEPENDENT_AMBULATORY_CARE_PROVIDER_SITE_OTHER): Payer: Medicare Other | Admitting: Cardiovascular Disease

## 2016-01-20 ENCOUNTER — Encounter: Payer: Self-pay | Admitting: Cardiovascular Disease

## 2016-01-20 ENCOUNTER — Ambulatory Visit: Payer: Medicare Other | Admitting: Family Medicine

## 2016-01-20 VITALS — BP 100/62 | HR 91 | Ht 67.0 in | Wt 251.5 lb

## 2016-01-20 DIAGNOSIS — I25119 Atherosclerotic heart disease of native coronary artery with unspecified angina pectoris: Secondary | ICD-10-CM

## 2016-01-20 NOTE — Progress Notes (Signed)
This visit was rescheduled due to urgent procedure.

## 2016-01-27 ENCOUNTER — Other Ambulatory Visit: Payer: Self-pay

## 2016-01-27 DIAGNOSIS — K219 Gastro-esophageal reflux disease without esophagitis: Secondary | ICD-10-CM

## 2016-01-27 MED ORDER — PANTOPRAZOLE SODIUM 40 MG PO TBEC: 40.0000 mg | DELAYED_RELEASE_TABLET | Freq: Two times a day (BID) | ORAL | Status: DC

## 2016-01-30 ENCOUNTER — Ambulatory Visit: Payer: Medicare Other | Admitting: Family Medicine

## 2016-02-17 ENCOUNTER — Encounter: Payer: Self-pay | Admitting: Anesthesiology

## 2016-02-17 ENCOUNTER — Ambulatory Visit: Payer: Medicare Other | Attending: Anesthesiology | Admitting: Anesthesiology

## 2016-02-17 VITALS — BP 140/88 | HR 86 | Temp 98.1°F | Resp 16 | Ht 67.0 in | Wt 246.0 lb

## 2016-02-17 DIAGNOSIS — A6923 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: Secondary | ICD-10-CM | POA: Diagnosis not present

## 2016-02-17 DIAGNOSIS — M47897 Other spondylosis, lumbosacral region: Secondary | ICD-10-CM | POA: Diagnosis not present

## 2016-02-17 DIAGNOSIS — M5136 Other intervertebral disc degeneration, lumbar region: Secondary | ICD-10-CM | POA: Insufficient documentation

## 2016-02-17 DIAGNOSIS — K219 Gastro-esophageal reflux disease without esophagitis: Secondary | ICD-10-CM | POA: Insufficient documentation

## 2016-02-17 DIAGNOSIS — M542 Cervicalgia: Secondary | ICD-10-CM | POA: Diagnosis not present

## 2016-02-17 DIAGNOSIS — G7000 Myasthenia gravis without (acute) exacerbation: Secondary | ICD-10-CM | POA: Insufficient documentation

## 2016-02-17 DIAGNOSIS — I252 Old myocardial infarction: Secondary | ICD-10-CM | POA: Insufficient documentation

## 2016-02-17 DIAGNOSIS — E785 Hyperlipidemia, unspecified: Secondary | ICD-10-CM | POA: Insufficient documentation

## 2016-02-17 DIAGNOSIS — M47817 Spondylosis without myelopathy or radiculopathy, lumbosacral region: Secondary | ICD-10-CM

## 2016-02-17 DIAGNOSIS — M549 Dorsalgia, unspecified: Secondary | ICD-10-CM | POA: Diagnosis present

## 2016-02-17 MED ORDER — CYCLOBENZAPRINE HCL 10 MG PO TABS: 10.0000 mg | ORAL_TABLET | Freq: Three times a day (TID) | ORAL | Status: DC | PRN

## 2016-02-17 MED ORDER — OXYCODONE HCL 10 MG PO TABS: 10.0000 mg | ORAL_TABLET | Freq: Four times a day (QID) | ORAL | Status: DC | PRN

## 2016-02-17 NOTE — Progress Notes (Signed)
Patient here for medication management, oxycodone hcl 10 mg and flexeril 10 mg.  Patient is doing well from previous procedure but is now experiencing neck pain that is only relieved by stretching and "working it out".   Patients states that he is in the preparation stages of having bariatric surgery.  Safety precautions to be maintained throughout the outpatient stay will include: orient to surroundings, keep bed in low position, maintain call bell within reach at all times, provide assistance with transfer out of bed and ambulation.

## 2016-02-17 NOTE — Patient Instructions (Signed)
You were given a prescription for Oxycodone and Cyclobenzaprine today. Facet Blocks Patient Information  Description: The facets are joints in the spine between the vertebrae.  Like any joints in the body, facets can become irritated and painful.  Arthritis can also effect the facets.  By injecting steroids and local anesthetic in and around these joints, we can temporarily block the nerve supply to them.  Steroids act directly on irritated nerves and tissues to reduce selling and inflammation which often leads to decreased pain.  Facet blocks may be done anywhere along the spine from the neck to the low back depending upon the location of your pain.   After numbing the skin with local anesthetic (like Novocaine), a small needle is passed onto the facet joints under x-ray guidance.  You may experience a sensation of pressure while this is being done.  The entire block usually lasts about 15-25 minutes.   Conditions which may be treated by facet blocks:   Low back/buttock pain  Neck/shoulder pain  Certain types of headaches  Preparation for the injection:  1. Do not eat any solid food or dairy products within 8 hours of your appointment. 2. You may drink clear liquid up to 3 hours before appointment.  Clear liquids include water, black coffee, juice or soda.  No milk or cream please. 3. You may take your regular medication, including pain medications, with a sip of water before your appointment.  Diabetics should hold regular insulin (if taken separately) and take 1/2 normal NPH dose the morning of the procedure.  Carry some sugar containing items with you to your appointment. 4. A driver must accompany you and be prepared to drive you home after your procedure. 5. Bring all your current medications with you. 6. An IV may be inserted and sedation may be given at the discretion of the physician. 7. A blood pressure cuff, EKG and other monitors will often be applied during the procedure.  Some  patients may need to have extra oxygen administered for a short period. 8. You will be asked to provide medical information, including your allergies and medications, prior to the procedure.  We must know immediately if you are taking blood thinners (like Coumadin/Warfarin) or if you are allergic to IV iodine contrast (dye).  We must know if you could possible be pregnant.  Possible side-effects:   Bleeding from needle site  Infection (rare, may require surgery)  Nerve injury (rare)  Numbness & tingling (temporary)  Difficulty urinating (rare, temporary)  Spinal headache (a headache worse with upright posture)  Light-headedness (temporary)  Pain at injection site (serveral days)  Decreased blood pressure (rare, temporary)  Weakness in arm/leg (temporary)  Pressure sensation in back/neck (temporary)   Call if you experience:   Fever/chills associated with headache or increased back/neck pain  Headache worsened by an upright position  New onset, weakness or numbness of an extremity below the injection site  Hives or difficulty breathing (go to the emergency room)  Inflammation or drainage at the injection site(s)  Severe back/neck pain greater than usual  New symptoms which are concerning to you  Please note:  Although the local anesthetic injected can often make your back or neck feel good for several hours after the injection, the pain will likely return. It takes 3-7 days for steroids to work.  You may not notice any pain relief for at least one week.  If effective, we will often do a series of 2-3 injections spaced 3-6 weeks  apart to maximally decrease your pain.  After the initial series, you may be a candidate for a more permanent nerve block of the facets.  If you have any questions, please call #[phone] [facility]

## 2016-02-18 NOTE — Progress Notes (Signed)
Subjective:  Patient ID: [name], male    DOB: 1961-05-11  Age: 55 y.o. MRN: [id number]  CC: Back Pain and Neck Pain  Procedure: None  HPI [name] presents for reevaluation. He has done well following his previous facet blocks. The quality characteristic distribution of his pain have remained stable. He is taking his medications as prescribed and denies any diverting or illicit use. This regimen keeps his pain under good control based on his narcotic assessment sheet. His urine tox screens have been appropriate as well. He has had some occasional reoccurrence of the lower back pain for which she received his facet blocks and would like to schedule a repeat facet injection at his next visit. ---------------------------------------------------------------------------------------------------------------------- Past Medical History  Diagnosis Date  . GERD (gastroesophageal reflux disease)   . Hyperlipidemia   . Allergy     dust, seasonal (worse in the fall).  . Lyme disease     Chronic  . Cataract     First Dx in 2012  . Insomnia   . Headache     muscle tension  . Myasthenia gravis ([place])     ? Just eyes or systemic  . Hypertension     CONTROLLED ON MEDS  . Anginal pain (HCC)     1 OR 2 x IN 3 MONTHS  . Myocardial infarction ([place]) 2010  . Asthma     BRONCHITIS  . Sleep apnea     CPAP  . Shortness of breath dyspnea     EXERTION  . 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 Followed by Pain Specialist in [place], back and neck  . Myasthenia gravis ([place])   . Diabetes mellitus without complication ([place])     TYPE 2  . Diabetic peripheral neuropathy (HCC)     feet and hands  . Seasonal allergies   . Coronary artery disease     Has 10 stents, last placed 2 year ago, Dr Karle Starch in January    Past Surgical History  Procedure Laterality Date  . Tonsillectomy and adenoidectomy      As a child  . Bilateral carpal tunnel release Bilateral L in 2012 and R in 2013  .  Cardiac catheterization      Several Caths, most recent in  March 2016.  [name] Coronary angioplasty    . Eye surgery Bilateral 2012    cataract/bilateral vitrectomies  . Tunneled venous catheter placement      removed  . Colonoscopy with propofol N/A 01/10/2016    Procedure: COLONOSCOPY WITH PROPOFOL;  Surgeon: [name], MD;  Location: [facility] ENDOSCOPY;  Service: Endoscopy;  Laterality: N/A;  . Esophagogastroduodenoscopy (egd) with propofol N/A 01/10/2016    Procedure: ESOPHAGOGASTRODUODENOSCOPY (EGD) WITH PROPOFOL;  Surgeon: [name], MD;  Location: [facility] ENDOSCOPY;  Service: Endoscopy;  Laterality: N/A;    Family History  Problem Relation Age of Onset  . Diabetes Mother   . Heart disease Mother   . Cancer Father     Prostate CA    Social History  Substance Use Topics  . Smoking status: Never Smoker   . Smokeless tobacco: Never Used  . Alcohol Use: 0.0 oz/week    0 Standard drinks or equivalent per week     Comment: occasional/1-2 BEERS PER YEAR    ---------------------------------------------------------------------------------------------------------------------- Social History   Social History  . Marital Status: Married    Spouse Name: N/A  . Number of Children: N/A  . Years of Education: N/A   Social History Main Topics  .  Smoking status: Never Smoker   . Smokeless tobacco: Never Used  . Alcohol Use: 0.0 oz/week    0 Standard drinks or equivalent per week     Comment: occasional/1-2 BEERS PER YEAR  . Drug Use: No  . Sexual Activity: No   Other Topics Concern  . None   Social History Narrative      ----------------------------------------------------------------------------------------------------------------------  ROS Review of Systems   Cardiac is positive for high blood pressure chest pain previous heart attack status post stent Pulmonary positive for history of asthma and sleep apnea Neurologic is positive for Lyme disease myasthenia gravis and  peripheral neuropathy Psychological is positive for depression Gastrointestinal positive for reflux Endocrine is positive for diabetes   Objective:  BP 140/88 mmHg  Pulse 86  Temp(Src) 98.1 F (36.7 C) (Oral)  Resp 16  Ht 5\' 7"  (1.702 m)  Wt 246 lb (111.585 kg)  BMI 38.52 kg/m2  SpO2 97%  Physical Exam Patient is alert oriented cooperative compliant and a good historian Heart is regular rate and rhythm without murmur Lungs show extremity wheezing worse on the right than left Inspection of his low back continues to reveals significant paraspinous muscle tenderness bilaterally he also has pain on extension with left lateral rotation.   Assessment & Plan:   [name] was seen today for back pain and neck pain.  Diagnoses and all orders for this visit:  Facet arthritis of lumbosacral region -     ToxASSURE Select 13 (MW), Urine  DDD (degenerative disc disease), lumbar -     ToxASSURE Select 13 (MW), Urine  Cervicalgia -     ToxASSURE Select 13 (MW), Urine  Other orders -     Oxycodone HCl 10 MG TABS; Take 1 tablet (10 mg total) by mouth 4 (four) times daily as needed. -     cyclobenzaprine (FLEXERIL) 10 MG tablet; Take 1 tablet (10 mg total) by mouth 3 (three) times daily as needed for muscle spasms.     ----------------------------------------------------------------------------------------------------------------------  Problem List Items Addressed This Visit    None    Visit Diagnoses    Facet arthritis of lumbosacral region    -  Primary    Relevant Medications    Oxycodone HCl 10 MG TABS    cyclobenzaprine (FLEXERIL) 10 MG tablet    Other Relevant Orders    ToxASSURE Select 13 (MW), Urine    DDD (degenerative disc disease), lumbar        Relevant Medications    Oxycodone HCl 10 MG TABS    cyclobenzaprine (FLEXERIL) 10 MG tablet    Other Relevant Orders    ToxASSURE Select 13 (MW), Urine    Cervicalgia        Relevant Orders    ToxASSURE Select 13 (MW),  Urine       ----------------------------------------------------------------------------------------------------------------------  1.  Facet arthritis of lumbosacral region we will defer on repeat injection today with return to clinic in 1 month for reevaluation and  repeat injection at that time.  He is to continue his current medication regimen with refills given today.  2. 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 Myasthenia gravis ([place]) Continue follow-up with Dr. Melrose Nakayama  5. Cervicalgia I discussed some options with him regarding stretching strengthening exercises for his neck pain. This seems to be stable in nature with no complaints of numbness or tingling affecting the upper extremities no loss of strength noted per patient. If symptoms change we  will consider a neurosurgical evaluation and further studies if indicated.    ----------------------------------------------------------------------------------------------------------------------  I am having Mr. Train maintain his insulin glargine, insulin lispro, pyridostigmine, bisoprolol-hydrochlorothiazide, nitroGLYCERIN, Loratadine (CLARITIN PO), albuterol, augmented betamethasone dipropionate, glucose blood, gabapentin, gabapentin, zolpidem, rosuvastatin, metFORMIN, cyanocobalamin, aspirin, fluticasone-salmeterol, albuterol, amLODipine, pantoprazole, Oxycodone HCl, and cyclobenzaprine. We will continue to administer lidocaine (PF), midazolam, ropivacaine (PF) 2 mg/ml (0.2%), sodium chloride flush, triamcinolone acetonide, triamcinolone acetonide, sodium chloride flush, ropivacaine (PF) 2 mg/ml (0.2%), and lidocaine (PF).   Meds ordered this encounter  Medications  . Oxycodone HCl 10 MG TABS    Sig: Take 1 tablet (10 mg total) by mouth 4 (four) times daily as needed.    Dispense:  120 tablet    Refill:  0    Do not fill until [id number]  . cyclobenzaprine (FLEXERIL) 10 MG tablet     Sig: Take 1 tablet (10 mg total) by mouth 3 (three) times daily as needed for muscle spasms.    Dispense:  90 tablet    Refill:  5       Follow-up: Return in about 1 month (around 03/19/2016) for procedure.   Procedure note:None    Molli Barrows, MD

## 2016-02-25 LAB — TOXASSURE SELECT 13 (MW), URINE

## 2016-02-27 ENCOUNTER — Ambulatory Visit: Payer: Medicare Other | Admitting: Cardiovascular Disease

## 2016-03-16 ENCOUNTER — Encounter: Payer: Self-pay | Admitting: Cardiovascular Disease

## 2016-03-16 ENCOUNTER — Ambulatory Visit (INDEPENDENT_AMBULATORY_CARE_PROVIDER_SITE_OTHER): Payer: Medicare Other | Admitting: Cardiovascular Disease

## 2016-03-16 VITALS — BP 138/86 | HR 96 | Ht 67.0 in | Wt 255.0 lb

## 2016-03-16 DIAGNOSIS — I25119 Atherosclerotic heart disease of native coronary artery with unspecified angina pectoris: Secondary | ICD-10-CM

## 2016-03-16 DIAGNOSIS — I10 Essential (primary) hypertension: Secondary | ICD-10-CM

## 2016-03-16 NOTE — Progress Notes (Signed)
Cardiology Office Note   Date:  03/16/2016   ID:  Brandt Shellman, DOB 02-24-1961, MRN [id number]  PCP:  Keith Rake, MD  Cardiologist:   [name], MD   Chief Complaint  Patient presents with  . other    2 month follow up. Meds reviewed by the patient verbally. Pt. c/o fatigue with occas. angina.       History of Present Illness: [name] is a 55 y.o. male who presents for a follow-up visit regarding coronary artery disease. He  moved from [place] and previously lived [place]. He has extensive cardiac history. He reports history of coronary artery disease with a total of 10 stents placed starting in 2009 after he presented with myocardial infarction. Most recent cardiac catheterization according to him was in March 2016 which showed no obstructive disease and he was treated medically. He has other chronic medical conditions that include diabetes, hypertension, hyperlipidemia,myasthenia gravis,  generalized pain due to reported Lyme disease and obesity. He reports chronic chest pain which has been stable. He has chronic exertional dyspnea as well. Echocardiogram in January 2017 Showed normal LV systolic function and wall motion with no evidence of pulmonary hypertension. He has history of hyperlipidemia with intolerance to high-dose statins.  Past Medical History  Diagnosis Date  . GERD (gastroesophageal reflux disease)   . Hyperlipidemia   . Allergy     dust, seasonal (worse in the fall).  . Lyme disease     Chronic  . Cataract     First Dx in 2012  . Insomnia   . Headache     muscle tension  . Myasthenia gravis ([place])     ? Just eyes or systemic  . Hypertension     CONTROLLED ON MEDS  . Anginal pain (HCC)     1 OR 2 x IN 3 MONTHS  . Myocardial infarction ([place]) 2010  . Asthma     BRONCHITIS  . Sleep apnea     CPAP  . Shortness of breath dyspnea     EXERTION  . 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 Followed by Pain Specialist in [place], back and neck  .  Myasthenia gravis ([place])   . Diabetes mellitus without complication ([place])     TYPE 2  . Diabetic peripheral neuropathy (HCC)     feet and hands  . Seasonal allergies   . Coronary artery disease     Has 10 stents, last placed 2 year ago, Dr Karle Starch in January    Past Surgical History  Procedure Laterality Date  . Tonsillectomy and adenoidectomy      As a child  . Bilateral carpal tunnel release Bilateral L in 2012 and R in 2013  . Cardiac catheterization      Several Caths, most recent in  March 2016.  [name] Coronary angioplasty    . Eye surgery Bilateral 2012    cataract/bilateral vitrectomies  . Tunneled venous catheter placement      removed  . Colonoscopy with propofol N/A 01/10/2016    Procedure: COLONOSCOPY WITH PROPOFOL;  Surgeon: [name], MD;  Location: [facility] END[facility]COPY;  Service: Endoscopy;  Laterality: N/A;  . Esophagogastroduodenoscopy (egd) with propofol N/A 01/10/2016    Procedure: ESOPHAGOGASTRODUODENOSCOPY (EGD) WITH PROPOFOL;  Surgeon: [name], MD;  Location: [facility] END[facility];  Service: Endoscopy;  Laterality: N/A;     Current Outpatient Prescriptions  Medication Sig Dispense Refill  . albuterol (PROVENTIL HFA;VENTOLIN HFA) 108 (90 Base) MCG/ACT inhaler Inhale  2 puffs into the lungs every 6 (six) hours as needed for wheezing or shortness of breath.    [name] albuterol (PROVENTIL) (2.5 MG/3ML) 0.083% nebulizer solution Take 2.5 mg by nebulization every 6 (six) hours as needed for wheezing or shortness of breath.    [name] amLODipine (NORVASC) 5 MG tablet Take 1 tablet (5 mg total) by mouth daily. am 30 tablet 6  . aspirin 325 MG tablet Take 325 mg by mouth daily. AM    . augmented betamethasone dipropionate (DIPROLENE-AF) 0.05 % ointment Apply topically 2 (two) times daily. 45 g 0  . bisoprolol-hydrochlorothiazide (ZIAC) 2.5-6.25 MG tablet Take 1 tablet by mouth daily. am    . cyanocobalamin 1000 MCG tablet Take 1,000 mcg by mouth daily. am    . cyclobenzaprine  (FLEXERIL) 10 MG tablet Take 1 tablet (10 mg total) by mouth 3 (three) times daily as needed for muscle spasms. 90 tablet 5  . fluticasone-salmeterol (ADVAIR HFA) 230-21 MCG/ACT inhaler Inhale 1 puff into the lungs 2 (two) times daily. Reported on 01/09/2016    . gabapentin (NEURONTIN) 300 MG capsule Take 1 capsule (300 mg total) by mouth 3 (three) times daily. 90 capsule 1  . gabapentin (NEURONTIN) 800 MG tablet Take 1 tablet (800 mg total) by mouth at bedtime. 90 tablet 0  . glucose blood (BAYER CONTOUR TEST) test strip Use as instructed 100 each 2  . insulin glargine (LANTUS) 100 UNIT/ML injection Inject 40 Units into the skin at bedtime.     . insulin lispro (HUMALOG KWIKPEN) 100 UNIT/ML KiwkPen Inject 8 Units into the skin 3 (three) times daily. Reported on 12/02/2015/ sliding scale    . Loratadine (CLARITIN PO) Take by mouth daily. am    . metFORMIN (GLUCOPHAGE) 1000 MG tablet Take 1,000 mg by mouth 2 (two) times daily with a meal.    . nitroGLYCERIN (NITROSTAT) 0.3 MG SL tablet Place 0.3 mg under the tongue every 5 (five) minutes as needed for chest pain. Reported on 12/02/2015    . Oxycodone HCl 10 MG TABS Take 1 tablet (10 mg total) by mouth 4 (four) times daily as needed. 120 tablet 0  . pantoprazole (PROTONIX) 40 MG tablet Take 1 tablet (40 mg total) by mouth 2 (two) times daily. 60 tablet 11  . pyridostigmine (MESTINON) 60 MG tablet Take 60 mg by mouth 3 (three) times daily.     . rosuvastatin (CRESTOR) 5 MG tablet Take 2.5 mg by mouth daily at 6 PM.     . zolpidem (AMBIEN) 5 MG tablet Take 2 tablets (10 mg total) by mouth at bedtime as needed for sleep. (Patient taking differently: Take 5 mg by mouth at bedtime as needed for sleep. ) 30 tablet 2   Current Facility-Administered Medications  Medication Dose Route Frequency Provider Last Rate Last Dose  . lidocaine (PF) (XYLOCAINE) 1 % injection 5 mL  5 mL Subcutaneous Once Molli Barrows, MD      . lidocaine (PF) (XYLOCAINE) 1 % injection 5  mL  5 mL Subcutaneous Once Molli Barrows, MD      . midazolam (VERSED) injection 5 mg  5 mg Intravenous Once Molli Barrows, MD      . ropivacaine (PF) 2 mg/ml (0.2%) (NAROPIN) epidural 10 mL  10 mL Epidural Once Molli Barrows, MD      . ropivacaine (PF) 2 mg/ml (0.2%) (NAROPIN) epidural 10 mL  10 mL Epidural Once Molli Barrows, MD      .  sodium chloride flush (NS) 0.9 % injection 10 mL  10 mL Other Once Molli Barrows, MD      . sodium chloride flush (NS) 0.9 % injection 10 mL  10 mL Other Once Molli Barrows, MD      . triamcinolone acetonide (KENALOG-40) injection 40 mg  40 mg Other Once Molli Barrows, MD      . triamcinolone acetonide (KENALOG-40) injection 40 mg  40 mg Other Once Molli Barrows, MD        Allergies:   Novolog    Social History:  The patient  reports that he has never smoked. He has never used smokeless tobacco. He reports that he drinks alcohol. He reports that he does not use illicit drugs.   Family History:  The patient's family history includes Cancer in his father; Diabetes in his mother; Heart disease in his mother.    ROS:  Please see the history of present illness.   Otherwise, review of systems are positive for none.   All other systems are reviewed and negative.    PHYSICAL EXAM: VS:  BP 138/86 mmHg  Pulse 96  Ht 5\' 7"  (1.702 m)  Wt 255 lb (115.667 kg)  BMI 39.93 kg/m2 , BMI Body mass index is 39.93 kg/(m^2). GEN: Well nourished, well developed, in no acute distress HEENT: normal Neck: no JVD, carotid bruits, or masses Cardiac: RRR; no murmurs, rubs, or gallops,no edema  Respiratory:  clear to auscultation bilaterally, normal work of breathing GI: soft, nontender, nondistended, + BS MS: no deformity or atrophy Skin: warm and dry, no rash Neuro:  Strength and sensation are intact Psych: euthymic mood, full affect   EKG:  EKG is ordered today. The ekg ordered today demonstrates normal sinus rhythm with possible old inferior infarct and anterolateral  infarct.   Recent Labs: 12/02/2015: ALT 16    Lipid Panel No results found for: CHOL, TRIG, HDL, CHOLHDL, VLDL, LDLCALC, LDLDIRECT    Wt Readings from Last 3 Encounters:  03/16/16 255 lb (115.667 kg)  02/17/16 246 lb (111.585 kg)  01/20/16 251 lb 8 oz (114.08 kg)        ASSESSMENT AND PLAN:  1.  Coronary artery disease involving native coronary arteries with other forms of angina: The patient's symptoms are overall stable. Most recent echocardiogram showed normal LV systolic function.  Continue medical therapy.  2. Essential hypertension: Blood pressure is reasonably controlled on current medications.  3. Hyperlipidemia: He has known history of intolerance to high-dose statins and currently is on small dose rosuvastatin 2.5 mg once daily. This is being managed by endocrinology. He is going to have a repeat lipid profile in the near future. His previous lipid profile before starting treatment showed an LDL of 167. I suspect that the patient might require the addition of Zetia and we should consider treatment with a PCSK9 inhibitor if needed.   4. Obesity: The patient is considering surgical weight loss options. He might require stress testing if he decides to go that route.   Disposition:   FU with me in 6 months  Signed,  [name], MD  03/16/2016 10:38 AM    [place]

## 2016-03-16 NOTE — Patient Instructions (Signed)
Medication Instructions: Continue same medications.   Labwork: None.   Procedures/Testing: None.   Follow-Up: 6 months with Dr. Jamilla Galli.   Any Additional Special Instructions Will Be Listed Below (If Applicable).     If you need a refill on your cardiac medications before your next appointment, please call your pharmacy.   

## 2016-03-24 ENCOUNTER — Other Ambulatory Visit: Payer: Self-pay | Admitting: Cardiovascular Disease

## 2016-03-24 ENCOUNTER — Ambulatory Visit: Payer: Medicare Other | Attending: Anesthesiology | Admitting: Anesthesiology

## 2016-03-24 ENCOUNTER — Encounter: Payer: Self-pay | Admitting: Anesthesiology

## 2016-03-24 VITALS — BP 135/80 | HR 87 | Temp 96.6°F | Resp 12 | Ht 67.0 in | Wt 252.0 lb

## 2016-03-24 DIAGNOSIS — G44209 Tension-type headache, unspecified, not intractable: Secondary | ICD-10-CM | POA: Insufficient documentation

## 2016-03-24 DIAGNOSIS — E1140 Type 2 diabetes mellitus with diabetic neuropathy, unspecified: Secondary | ICD-10-CM | POA: Insufficient documentation

## 2016-03-24 DIAGNOSIS — Z955 Presence of coronary angioplasty implant and graft: Secondary | ICD-10-CM | POA: Diagnosis not present

## 2016-03-24 DIAGNOSIS — K219 Gastro-esophageal reflux disease without esophagitis: Secondary | ICD-10-CM | POA: Insufficient documentation

## 2016-03-24 DIAGNOSIS — M542 Cervicalgia: Secondary | ICD-10-CM | POA: Diagnosis not present

## 2016-03-24 DIAGNOSIS — G7000 Myasthenia gravis without (acute) exacerbation: Secondary | ICD-10-CM | POA: Insufficient documentation

## 2016-03-24 DIAGNOSIS — E785 Hyperlipidemia, unspecified: Secondary | ICD-10-CM | POA: Insufficient documentation

## 2016-03-24 DIAGNOSIS — I10 Essential (primary) hypertension: Secondary | ICD-10-CM | POA: Insufficient documentation

## 2016-03-24 DIAGNOSIS — M47898 Other spondylosis, sacral and sacrococcygeal region: Secondary | ICD-10-CM | POA: Insufficient documentation

## 2016-03-24 DIAGNOSIS — I252 Old myocardial infarction: Secondary | ICD-10-CM | POA: Diagnosis not present

## 2016-03-24 DIAGNOSIS — G4730 Sleep apnea, unspecified: Secondary | ICD-10-CM | POA: Diagnosis not present

## 2016-03-24 DIAGNOSIS — M79606 Pain in leg, unspecified: Secondary | ICD-10-CM | POA: Diagnosis present

## 2016-03-24 DIAGNOSIS — A6923 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: Secondary | ICD-10-CM | POA: Insufficient documentation

## 2016-03-24 DIAGNOSIS — M47817 Spondylosis without myelopathy or radiculopathy, lumbosacral region: Secondary | ICD-10-CM

## 2016-03-24 DIAGNOSIS — I2510 Atherosclerotic heart disease of native coronary artery without angina pectoris: Secondary | ICD-10-CM | POA: Insufficient documentation

## 2016-03-24 DIAGNOSIS — M549 Dorsalgia, unspecified: Secondary | ICD-10-CM | POA: Diagnosis present

## 2016-03-24 DIAGNOSIS — M5136 Other intervertebral disc degeneration, lumbar region: Secondary | ICD-10-CM | POA: Diagnosis not present

## 2016-03-24 DIAGNOSIS — G4700 Insomnia, unspecified: Secondary | ICD-10-CM | POA: Diagnosis not present

## 2016-03-24 MED ORDER — ROPIVACAINE HCL 2 MG/ML IJ SOLN: 10.0000 mL | Freq: Once | INTRAMUSCULAR | Status: DC

## 2016-03-24 MED ORDER — LIDOCAINE HCL (PF) 1 % IJ SOLN: 5.0000 mL | Freq: Once | INTRAMUSCULAR | Status: DC

## 2016-03-24 MED ORDER — OXYCODONE HCL 10 MG PO TABS: 10.0000 mg | ORAL_TABLET | Freq: Four times a day (QID) | ORAL | Status: DC | PRN

## 2016-03-24 MED ORDER — ROPIVACAINE HCL 2 MG/ML IJ SOLN
INTRAMUSCULAR | Status: AC
Start: 2016-03-24 — End: 2016-03-24
  Administered 2016-03-24: 16:00:00
  Filled 2016-03-24: qty 10

## 2016-03-24 MED ORDER — TRIAMCINOLONE ACETONIDE 40 MG/ML IJ SUSP: 40.0000 mg | Freq: Once | INTRAMUSCULAR | Status: DC

## 2016-03-24 MED ORDER — TRIAMCINOLONE ACETONIDE 40 MG/ML IJ SUSP
INTRAMUSCULAR | Status: AC
  Administered 2016-03-24: 16:00:00
  Filled 2016-03-24: qty 1

## 2016-03-24 NOTE — Patient Instructions (Signed)
Facet Blocks Patient Information  Description: The facets are joints in the spine between the vertebrae.  Like any joints in the body, facets can become irritated and painful.  Arthritis can also effect the facets.  By injecting steroids and local anesthetic in and around these joints, we can temporarily block the nerve supply to them.  Steroids act directly on irritated nerves and tissues to reduce selling and inflammation which often leads to decreased pain.  Facet blocks may be done anywhere along the spine from the neck to the low back depending upon the location of your pain.   After numbing the skin with local anesthetic (like Novocaine), a small needle is passed onto the facet joints under x-ray guidance.  You may experience a sensation of pressure while this is being done.  The entire block usually lasts about 15-25 minutes.   Conditions which may be treated by facet blocks:   Low back/buttock pain  Neck/shoulder pain  Certain types of headaches  Preparation for the injection:  1. Do not eat any solid food or dairy products within 8 hours of your appointment. 2. You may drink clear liquid up to 3 hours before appointment.  Clear liquids include water, black coffee, juice or soda.  No milk or cream please. 3. You may take your regular medication, including pain medications, with a sip of water before your appointment.  Diabetics should hold regular insulin (if taken separately) and take 1/2 normal NPH dose the morning of the procedure.  Carry some sugar containing items with you to your appointment. 4. A driver must accompany you and be prepared to drive you home after your procedure. 5. Bring all your current medications with you. 6. An IV may be inserted and sedation may be given at the discretion of the physician. 7. A blood pressure cuff, EKG and other monitors will often be applied during the procedure.  Some patients may need to have extra oxygen administered for a short  period. 8. You will be asked to provide medical information, including your allergies and medications, prior to the procedure.  We must know immediately if you are taking blood thinners (like Coumadin/Warfarin) or if you are allergic to IV iodine contrast (dye).  We must know if you could possible be pregnant.  Possible side-effects:   Bleeding from needle site  Infection (rare, may require surgery)  Nerve injury (rare)  Numbness & tingling (temporary)  Difficulty urinating (rare, temporary)  Spinal headache (a headache worse with upright posture)  Light-headedness (temporary)  Pain at injection site (serveral days)  Decreased blood pressure (rare, temporary)  Weakness in arm/leg (temporary)  Pressure sensation in back/neck (temporary)   Call if you experience:   Fever/chills associated with headache or increased back/neck pain  Headache worsened by an upright position  New onset, weakness or numbness of an extremity below the injection site  Hives or difficulty breathing (go to the emergency room)  Inflammation or drainage at the injection site(s)  Severe back/neck pain greater than usual  New symptoms which are concerning to you  Please note:  Although the local anesthetic injected can often make your back or neck feel good for several hours after the injection, the pain will likely return. It takes 3-7 days for steroids to work.  You may not notice any pain relief for at least one week.  If effective, we will often do a series of 2-3 injections spaced 3-6 weeks apart to maximally decrease your pain.  After the initial   series, you may be a candidate for a more permanent nerve block of the facets.  If you have any questions, please call #[phone]) [facility]   Be careful moving about. Muscle spasms in the area of the injection may occur.  Use ice for the next 24 hours (15 minutes on, 15 minutes off).  After 24 hours, you  may use heat for comfort if you wish.  Post procedure numbness or redness is expected, average 4-6 hours.  If numbness develops after 4-6 hours and is felt to be progressing and worsening, immediately contact your physician.

## 2016-03-24 NOTE — Progress Notes (Signed)
Safety precautions to be maintained throughout the outpatient stay will include: orient to surroundings, keep bed in low position, maintain call bell within reach at all times, provide assistance with transfer out of bed and ambulation.  

## 2016-03-25 ENCOUNTER — Telehealth: Payer: Self-pay | Admitting: *Deleted

## 2016-03-25 NOTE — Telephone Encounter (Signed)
Attempted to call patient for post procedure follow-up, no answer.

## 2016-03-25 NOTE — Telephone Encounter (Signed)
No answer,

## 2016-03-26 NOTE — Progress Notes (Addendum)
Subjective:  Patient ID: [name], male    DOB: [birth year]  Age: 55 y.o. MRN: [id number]  CC: Back Pain and Leg Pain  Procedure: Left side L3-4 L4-5 L5-S1 intra-articular facet injection under fluoroscopic guidance with no sedation  HPI [name] presents for reevaluation. He has done well following his previous facet blocks. The quality characteristic distribution of his pain have remained stable. He is taking his medications as prescribed and denies any diverting or illicit use. This regimen keeps his pain under good control based on his narcotic assessment sheet. His urine tox screens have been appropriate as well. He has had some occasional reoccurrence of the lower back pain for which she received his facet blocks and he would like to proceed with an additional injection today. They've given him approximately 70% relief with his low back pain and he feels that his pain is more moderate. Medications that he is taking for pain are working better and he sleeping better with better pain control throughout the day. He denies any problems with the medications presently and denies diverting or illicit use. Based on his narcotic assessment sheet he is doing well  Procedure history: Facet block in January March and April 2017 ---------------------------------------------------------------------------------------------------------------------- Past Medical History  Diagnosis Date  . GERD (gastroesophageal reflux disease)   . Hyperlipidemia   . Allergy     dust, seasonal (worse in the fall).  . Lyme disease     Chronic  . Cataract     First Dx in 2012  . Insomnia   . Headache     muscle tension  . Myasthenia gravis ([place])     ? Just eyes or systemic  . Hypertension     CONTROLLED ON MEDS  . Anginal pain (HCC)     1 OR 2 x IN 3 MONTHS  . Myocardial infarction ([place]) 2010  . Asthma     BRONCHITIS  . Sleep apnea     CPAP  . Shortness of breath dyspnea     EXERTION  . Arthritis      2/2 Lyme Disease. Followed by Pain Specialist in [place], back and neck  . Myasthenia gravis ([place])   . Diabetes mellitus without complication ([place])     TYPE 2  . Diabetic peripheral neuropathy (HCC)     feet and hands  . Seasonal allergies   . Coronary artery disease     Has 10 stents, last placed 2 year ago, Dr Karle Starch in January    Past Surgical History  Procedure Laterality Date  . Tonsillectomy and adenoidectomy      As a child  . Bilateral carpal tunnel release Bilateral L in 2012 and R in 2013  . Cardiac catheterization      Several Caths, most recent in  March 2016.  [name] Coronary angioplasty    . Eye surgery Bilateral 2012    cataract/bilateral vitrectomies  . Tunneled venous catheter placement      removed  . Colonoscopy with propofol N/A 01/10/2016    Procedure: COLONOSCOPY WITH PROPOFOL;  Surgeon: [name], MD;  Location: [facility] ENDOSCOPY;  Service: Endoscopy;  Laterality: N/A;  . Esophagogastroduodenoscopy (egd) with propofol N/A 01/10/2016    Procedure: ESOPHAGOGASTRODUODENOSCOPY (EGD) WITH PROPOFOL;  Surgeon: [name], MD;  Location: [facility] ENDOSCOPY;  Service: Endoscopy;  Laterality: N/A;    Family History  Problem Relation Age of Onset  . Diabetes Mother   . Heart disease Mother   . Cancer Father     Prostate  CA    Social History  Substance Use Topics  . Smoking status: Never Smoker   . Smokeless tobacco: Never Used  . Alcohol Use: 0.0 oz/week    0 Standard drinks or equivalent per week     Comment: occasional/1-2 BEERS PER YEAR    ---------------------------------------------------------------------------------------------------------------------- Social History   Social History  . Marital Status: Married    Spouse Name: N/A  . Number of Children: N/A  . Years of Education: N/A   Social History Main Topics  . Smoking status: Never Smoker   . Smokeless tobacco: Never Used  . Alcohol Use: 0.0 oz/week    0 Standard drinks or  equivalent per week     Comment: occasional/1-2 BEERS PER YEAR  . Drug Use: No  . Sexual Activity: No   Other Topics Concern  . None   Social History Narrative      ----------------------------------------------------------------------------------------------------------------------  ROS Review of Systems   Cardiac is positive for high blood pressure chest pain previous heart attack status post stent Pulmonary positive for history of asthma and sleep apnea Neurologic is positive for Lyme disease myasthenia gravis and peripheral neuropathy Psychological is positive for depression Gastrointestinal positive for reflux Endocrine is positive for diabetes   Objective:  BP 135/80 mmHg  Pulse 87  Temp(Src) 96.6 F (35.9 C) (Oral)  Resp 12  Ht 5\' 7"  (1.702 m)  Wt 252 lb (114.306 kg)  BMI 39.46 kg/m2  SpO2 98%  Physical Exam Patient is alert oriented cooperative compliant and a good historian Heart is regular rate and rhythm without murmur Lungs show extremity wheezing worse on the right than left Inspection of his low back diminished back spasming based on comparative to baseline   Assessment & Plan:   Moir was seen today for back pain and leg pain.  Diagnoses and all orders for this visit:  Facet arthritis of lumbosacral region -     triamcinolone acetonide (KENALOG-40) injection 40 mg; 1 mL (40 mg total) by Other route once. -     ropivacaine (PF) 2 mg/ml (0.2%) (NAROPIN) epidural 10 mL; 10 mLs by Epidural route once. -     lidocaine (PF) (XYLOCAINE) 1 % injection 5 mL; Inject 5 mLs into the skin once.  DDD (degenerative disc disease), lumbar  Cervicalgia  Other orders -     Discontinue: Oxycodone HCl 10 MG TABS; Take 1 tablet (10 mg total) by mouth 4 (four) times daily as needed. -     Oxycodone HCl 10 MG TABS; Take 1 tablet (10 mg total) by mouth 4 (four) times daily as needed. -     ropivacaine (PF) 2 mg/ml (0.2%) (NAROPIN) 2 MG/ML epidural;  -      triamcinolone acetonide (KENALOG-40) 40 MG/ML injection;      ----------------------------------------------------------------------------------------------------------------------  Problem List Items Addressed This Visit    None    Visit Diagnoses    Facet arthritis of lumbosacral region    -  Primary    Relevant Medications    Oxycodone HCl 10 MG TABS    triamcinolone acetonide (KENALOG-40) injection 40 mg    ropivacaine (PF) 2 mg/ml (0.2%) (NAROPIN) epidural 10 mL    lidocaine (PF) (XYLOCAINE) 1 % injection 5 mL    triamcinolone acetonide (KENALOG-40) 40 MG/ML injection (Completed)    DDD (degenerative disc disease), lumbar        Relevant Medications    Oxycodone HCl 10 MG TABS    triamcinolone acetonide (KENALOG-40) injection 40 mg  triamcinolone acetonide (KENALOG-40) 40 MG/ML injection (Completed)    Cervicalgia           ----------------------------------------------------------------------------------------------------------------------  1.  Facet arthritis of lumbosacral region We will proceed with a repeat injection today and I will attempt to do this intra-articular. His been scheduled for return in approximately 2 months and we will defer on repeat injection at that time. We have discussed options regarding radiofrequency ablation if necessary in the future. 2. 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 Myasthenia gravis ([place]) Continue follow-up with Dr. Melrose Nakayama  5. Cervicalgia I discussed some options with him regarding stretching strengthening exercises for his neck pain. This seems to be stable in nature with no complaints of numbness or tingling affecting the upper extremities no loss of strength noted per patient. If symptoms change we will consider a neurosurgical evaluation and further studies if  indicated.    ----------------------------------------------------------------------------------------------------------------------  I am having Mr. Gilkes maintain his insulin glargine, insulin lispro, pyridostigmine, bisoprolol-hydrochlorothiazide, nitroGLYCERIN, Loratadine (CLARITIN PO), albuterol, augmented betamethasone dipropionate, glucose blood, gabapentin, gabapentin, zolpidem, rosuvastatin, metFORMIN, cyanocobalamin, aspirin, fluticasone-salmeterol, albuterol, amLODipine, pantoprazole, cyclobenzaprine, bisoprolol-hydrochlorothiazide, and Oxycodone HCl. We administered ropivacaine (PF) 2 mg/ml (0.2%) and triamcinolone acetonide. We will continue to administer lidocaine (PF), midazolam, ropivacaine (PF) 2 mg/ml (0.2%), sodium chloride flush, triamcinolone acetonide, triamcinolone acetonide, sodium chloride flush, ropivacaine (PF) 2 mg/ml (0.2%), lidocaine (PF), triamcinolone acetonide, ropivacaine (PF) 2 mg/ml (0.2%), and lidocaine (PF).   Meds ordered this encounter  Medications  . DISCONTD: Oxycodone HCl 10 MG TABS    Sig: Take 1 tablet (10 mg total) by mouth 4 (four) times daily as needed.    Dispense:  120 tablet    Refill:  0    Do not fill until [id number]  . Oxycodone HCl 10 MG TABS    Sig: Take 1 tablet (10 mg total) by mouth 4 (four) times daily as needed.    Dispense:  120 tablet    Refill:  0    Do not fill until [id number]  . triamcinolone acetonide (KENALOG-40) injection 40 mg    Sig:   . ropivacaine (PF) 2 mg/ml (0.2%) (NAROPIN) epidural 10 mL    Sig:   . lidocaine (PF) (XYLOCAINE) 1 % injection 5 mL    Sig:   . ropivacaine (PF) 2 mg/ml (0.2%) (NAROPIN) 2 MG/ML epidural    Sig:     GARNER, CYNTHIA: cabinet override  . triamcinolone acetonide (KENALOG-40) 40 MG/ML injection    Sig:     GARNER, CYNTHIA: cabinet override       Follow-up: Return in about 2 months (around 05/24/2016) for evaluation, med refill.   Procedure note: Patient was taken to the  fluoroscopy suite and placed in prone position. A total dose of 0 mg of Versed with 0 cc of fentanyl were titrated for moderate sedation. Vital signs were stable throughout the procedure. The  overlying area of skin on the  left side was prepped with Betadine 3 and strict aseptic technique was utilized throughout the procedure. Flouroscopy was used to I identify the areas overlying the aforementioned facets at L3-4  L4-5 and  L5-S1. 1% lidocaine 1 cc was infiltrated subcutaneously and into the fascia with a 25-gauge needle at each of these sites. I then advanced a 22-gauge 3-1/2 inch Quinckie needle with the needle tip to lie within the intra-articular position There was negative aspiration for heme or CSF and  no paresthesia. I then injected 2  cc of ropivacaine 0.2% mixed with10 mg of triamcinolone at each of the aforementioned sites. These needles were withdrawn. The patient was convalesced discharged home stable condition for follow-up as mentioned.    Molli Barrows, MD

## 2016-04-06 ENCOUNTER — Encounter: Payer: Self-pay | Admitting: Family Medicine

## 2016-04-06 ENCOUNTER — Ambulatory Visit (INDEPENDENT_AMBULATORY_CARE_PROVIDER_SITE_OTHER): Payer: Medicare Other | Admitting: Family Medicine

## 2016-04-06 VITALS — BP 132/71 | HR 122 | Temp 97.7°F | Resp 19 | Ht 67.0 in | Wt 252.4 lb

## 2016-04-06 DIAGNOSIS — Z0000 Encounter for general adult medical examination without abnormal findings: Secondary | ICD-10-CM | POA: Diagnosis not present

## 2016-04-06 DIAGNOSIS — IMO0001 Reserved for inherently not codable concepts without codable children: Secondary | ICD-10-CM

## 2016-04-06 NOTE — Progress Notes (Signed)
Name: [name]   MRN: [id number]    DOB: 1961-09-22   Date:04/06/2016       Progress Note  Subjective  Chief Complaint  Chief Complaint  Patient presents with  . Annual Exam    CPE    HPI  Pt. Is here for Complete Physical Exam. Last colonoscopy was March 2017. Last prostate exam and PSA was over 4-5 years ago.  Referral to Bariatric Specialist Patient wishes to be referred to bariatric surgeon to consider weight loss surgery, concerned about excess weight causing strain on his back which is causing back pain.  Past Medical History  Diagnosis Date  . GERD (gastroesophageal reflux disease)   . Hyperlipidemia   . Allergy     dust, seasonal (worse in the fall).  . Lyme disease     Chronic  . Cataract     First Dx in 2012  . Insomnia   . Headache     muscle tension  . Myasthenia gravis ([place])     ? Just eyes or systemic  . Hypertension     CONTROLLED ON MEDS  . Anginal pain (HCC)     1 OR 2 x IN 3 MONTHS  . Myocardial infarction ([place]) 2010  . Asthma     BRONCHITIS  . Sleep apnea     CPAP  . Shortness of breath dyspnea     EXERTION  . 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 Followed by Pain Specialist in [place], back and neck  . Myasthenia gravis ([place])   . Diabetes mellitus without complication ([place])     TYPE 2  . Diabetic peripheral neuropathy (HCC)     feet and hands  . Seasonal allergies   . Coronary artery disease     Has 10 stents, last placed 2 year ago, Dr Karle Starch in January    Past Surgical History  Procedure Laterality Date  . Tonsillectomy and adenoidectomy      As a child  . Bilateral carpal tunnel release Bilateral L in 2012 and R in 2013  . Cardiac catheterization      Several Caths, most recent in  March 2016.  [name] Coronary angioplasty    . Eye surgery Bilateral 2012    cataract/bilateral vitrectomies  . Tunneled venous catheter placement      removed  . Colonoscopy with propofol N/A 01/10/2016    Procedure: COLONOSCOPY WITH  PROPOFOL;  Surgeon: [name], MD;  Location: [facility] ENDOSCOPY;  Service: Endoscopy;  Laterality: N/A;  . Esophagogastroduodenoscopy (egd) with propofol N/A 01/10/2016    Procedure: ESOPHAGOGASTRODUODENOSCOPY (EGD) WITH PROPOFOL;  Surgeon: [name], MD;  Location: [facility] ENDOSCOPY;  Service: Endoscopy;  Laterality: N/A;    Family History  Problem Relation Age of Onset  . Diabetes Mother   . Heart disease Mother   . Cancer Father     Prostate CA    Social History   Social History  . Marital Status: Married    Spouse Name: N/A  . Number of Children: N/A  . Years of Education: N/A   Occupational History  . Not on file.   Social History Main Topics  . Smoking status: Never Smoker   . Smokeless tobacco: Never Used  . Alcohol Use: 0.0 oz/week    0 Standard drinks or equivalent per week     Comment: occasional/1-2 BEERS PER YEAR  . Drug Use: No  . Sexual Activity: No   Other Topics Concern  . Not on file  Social History Narrative     Current outpatient prescriptions:  .  albuterol (PROVENTIL HFA;VENTOLIN HFA) 108 (90 Base) MCG/ACT inhaler, Inhale 2 puffs into the lungs every 6 (six) hours as needed for wheezing or shortness of breath., Disp: , Rfl:  .  albuterol (PROVENTIL) (2.5 MG/3ML) 0.083% nebulizer solution, Take 2.5 mg by nebulization every 6 (six) hours as needed for wheezing or shortness of breath., Disp: , Rfl:  .  amLODipine (NORVASC) 5 MG tablet, Take 1 tablet (5 mg total) by mouth daily. am, Disp: 30 tablet, Rfl: 6 .  aspirin 325 MG tablet, Take 325 mg by mouth daily. AM, Disp: , Rfl:  .  augmented betamethasone dipropionate (DIPROLENE-AF) 0.05 % ointment, Apply topically 2 (two) times daily., Disp: 45 g, Rfl: 0 .  bisoprolol-hydrochlorothiazide (ZIAC) 2.5-6.25 MG tablet, Take 1 tablet by mouth daily. am, Disp: , Rfl:  .  bisoprolol-hydrochlorothiazide (ZIAC) 5-6.25 MG tablet, take 1/2 tablet by mouth once daily, Disp: 15 tablet, Rfl: 3 .  cyanocobalamin 1000 MCG  tablet, Take 1,000 mcg by mouth daily. am, Disp: , Rfl:  .  cyclobenzaprine (FLEXERIL) 10 MG tablet, Take 1 tablet (10 mg total) by mouth 3 (three) times daily as needed for muscle spasms., Disp: 90 tablet, Rfl: 5 .  fluticasone-salmeterol (ADVAIR HFA) 230-21 MCG/ACT inhaler, Inhale 1 puff into the lungs 2 (two) times daily. Reported on 01/09/2016, Disp: , Rfl:  .  gabapentin (NEURONTIN) 300 MG capsule, Take 1 capsule (300 mg total) by mouth 3 (three) times daily., Disp: 90 capsule, Rfl: 1 .  gabapentin (NEURONTIN) 800 MG tablet, Take 1 tablet (800 mg total) by mouth at bedtime., Disp: 90 tablet, Rfl: 0 .  glucose blood (BAYER CONTOUR TEST) test strip, Use as instructed, Disp: 100 each, Rfl: 2 .  insulin glargine (LANTUS) 100 UNIT/ML injection, Inject 40 Units into the skin at bedtime. , Disp: , Rfl:  .  insulin lispro (HUMALOG KWIKPEN) 100 UNIT/ML KiwkPen, Inject 8 Units into the skin 3 (three) times daily. Reported on 12/02/2015/ sliding scale, Disp: , Rfl:  .  LANTUS SOLOSTAR 100 UNIT/ML Solostar Pen, , Disp: , Rfl: 0 .  Loratadine (CLARITIN PO), Take by mouth daily. am, Disp: , Rfl:  .  metFORMIN (GLUCOPHAGE) 1000 MG tablet, Take 1,000 mg by mouth 2 (two) times daily with a meal., Disp: , Rfl:  .  montelukast (SINGULAIR) 10 MG tablet, , Disp: , Rfl: 1 .  nitroGLYCERIN (NITROSTAT) 0.3 MG SL tablet, Place 0.3 mg under the tongue every 5 (five) minutes as needed for chest pain. Reported on 12/02/2015, Disp: , Rfl:  .  Oxycodone HCl 10 MG TABS, Take 1 tablet (10 mg total) by mouth 4 (four) times daily as needed., Disp: 120 tablet, Rfl: 0 .  pantoprazole (PROTONIX) 40 MG tablet, Take 1 tablet (40 mg total) by mouth 2 (two) times daily., Disp: 60 tablet, Rfl: 11 .  pyridostigmine (MESTINON) 60 MG tablet, Take 60 mg by mouth 3 (three) times daily. , Disp: , Rfl:  .  rosuvastatin (CRESTOR) 5 MG tablet, Take 2.5 mg by mouth daily at 6 PM. , Disp: , Rfl:  .  zolpidem (AMBIEN) 5 MG tablet, Take 2 tablets (10  mg total) by mouth at bedtime as needed for sleep. (Patient taking differently: Take 5 mg by mouth at bedtime as needed for sleep. ), Disp: 30 tablet, Rfl: 2  Current facility-administered medications:  .  lidocaine (PF) (XYLOCAINE) 1 % injection 5 mL, 5 mL, Subcutaneous, Once, Jeneen Rinks  Tammi Klippel, MD .  lidocaine (PF) (XYLOCAINE) 1 % injection 5 mL, 5 mL, Subcutaneous, Once, Molli Barrows, MD .  lidocaine (PF) (XYLOCAINE) 1 % injection 5 mL, 5 mL, Subcutaneous, Once, Molli Barrows, MD .  midazolam (VERSED) injection 5 mg, 5 mg, Intravenous, Once, Molli Barrows, MD .  ropivacaine (PF) 2 mg/ml (0.2%) (NAROPIN) epidural 10 mL, 10 mL, Epidural, Once, Molli Barrows, MD .  ropivacaine (PF) 2 mg/ml (0.2%) (NAROPIN) epidural 10 mL, 10 mL, Epidural, Once, Molli Barrows, MD .  ropivacaine (PF) 2 mg/ml (0.2%) (NAROPIN) epidural 10 mL, 10 mL, Epidural, Once, Molli Barrows, MD .  sodium chloride flush (NS) 0.9 % injection 10 mL, 10 mL, Other, Once, Molli Barrows, MD .  sodium chloride flush (NS) 0.9 % injection 10 mL, 10 mL, Other, Once, Molli Barrows, MD .  triamcinolone acetonide (KENALOG-40) injection 40 mg, 40 mg, Other, Once, Molli Barrows, MD .  triamcinolone acetonide (KENALOG-40) injection 40 mg, 40 mg, Other, Once, Molli Barrows, MD .  triamcinolone acetonide (KENALOG-40) injection 40 mg, 40 mg, Other, Once, Molli Barrows, MD  Allergies  Allergen Reactions  . Novolog [Insulin Aspart] Hives     Review of Systems  Constitutional: Positive for malaise/fatigue. Negative for fever and chills.  Eyes: Negative for blurred vision and double vision.  Respiratory: Negative for cough and shortness of breath.   Cardiovascular: Negative for chest pain and leg swelling.  Gastrointestinal: Positive for heartburn. Negative for nausea, vomiting, blood in stool and melena.  Genitourinary: Negative for urgency and hematuria.  Musculoskeletal: Positive for myalgias and back pain.  Neurological: Negative for focal  weakness and headaches.  Psychiatric/Behavioral: Negative for depression. The patient has insomnia. The patient is not nervous/anxious.     Objective  Filed Vitals:   04/06/16 0859  BP: 132/71  Pulse: 122  Temp: 97.7 F (36.5 C)  TempSrc: Oral  Resp: 19  Height: 5\' 7"  (1.702 m)  Weight: 252 lb 6.4 oz (114.488 kg)  SpO2: 93%    Physical Exam  Constitutional: He is oriented to person, place, and time and well-developed, well-nourished, and in no distress.  HENT:  Head: Normocephalic and atraumatic.  Right Ear: Tympanic membrane and ear canal normal. No drainage or swelling.  Left Ear: Tympanic membrane and ear canal normal. No drainage or swelling.  Cardiovascular: Normal rate, regular rhythm and normal heart sounds.   No murmur heard. Pulmonary/Chest: Effort normal and breath sounds normal. He has no wheezes.  Abdominal: Soft. Bowel sounds are normal. There is no tenderness.  Genitourinary: Rectum normal and prostate normal.  Neurological: He is alert and oriented to person, place, and time.  Psychiatric: Mood, memory, affect and judgment normal.  Nursing note and vitals reviewed.      Assessment & Plan  1. Annual physical exam Obtain age appropriate laboratory screenings - Lipid Profile - TSH - Vitamin D (25 hydroxy) - PSA  2. Obesity, Class II, BMI 35-39.9, with comorbidity [facility]) Referral to bariatric specialist provided - Ambulatory referral to General Surgery   Lynsie Mcwatters [facility] 04/06/2016 9:06 AM

## 2016-04-13 ENCOUNTER — Encounter (INDEPENDENT_AMBULATORY_CARE_PROVIDER_SITE_OTHER): Payer: Self-pay

## 2016-04-27 DIAGNOSIS — R252 Cramp and spasm: Secondary | ICD-10-CM | POA: Insufficient documentation

## 2016-04-27 DIAGNOSIS — R262 Difficulty in walking, not elsewhere classified: Secondary | ICD-10-CM | POA: Insufficient documentation

## 2016-05-26 ENCOUNTER — Ambulatory Visit: Payer: Medicare Other | Attending: Anesthesiology | Admitting: Anesthesiology

## 2016-05-26 ENCOUNTER — Encounter: Payer: Self-pay | Admitting: Anesthesiology

## 2016-05-26 VITALS — BP 144/77 | HR 111 | Temp 98.3°F | Resp 16 | Ht 67.0 in | Wt 252.0 lb

## 2016-05-26 DIAGNOSIS — G7000 Myasthenia gravis without (acute) exacerbation: Secondary | ICD-10-CM | POA: Insufficient documentation

## 2016-05-26 DIAGNOSIS — M542 Cervicalgia: Secondary | ICD-10-CM | POA: Insufficient documentation

## 2016-05-26 DIAGNOSIS — K219 Gastro-esophageal reflux disease without esophagitis: Secondary | ICD-10-CM | POA: Diagnosis not present

## 2016-05-26 DIAGNOSIS — M47817 Spondylosis without myelopathy or radiculopathy, lumbosacral region: Secondary | ICD-10-CM

## 2016-05-26 DIAGNOSIS — G4730 Sleep apnea, unspecified: Secondary | ICD-10-CM | POA: Insufficient documentation

## 2016-05-26 DIAGNOSIS — A6923 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: Secondary | ICD-10-CM | POA: Insufficient documentation

## 2016-05-26 DIAGNOSIS — J45909 Unspecified asthma, uncomplicated: Secondary | ICD-10-CM | POA: Insufficient documentation

## 2016-05-26 DIAGNOSIS — I2510 Atherosclerotic heart disease of native coronary artery without angina pectoris: Secondary | ICD-10-CM | POA: Diagnosis not present

## 2016-05-26 DIAGNOSIS — I252 Old myocardial infarction: Secondary | ICD-10-CM | POA: Insufficient documentation

## 2016-05-26 DIAGNOSIS — E785 Hyperlipidemia, unspecified: Secondary | ICD-10-CM | POA: Insufficient documentation

## 2016-05-26 DIAGNOSIS — M5136 Other intervertebral disc degeneration, lumbar region: Secondary | ICD-10-CM

## 2016-05-26 DIAGNOSIS — E1142 Type 2 diabetes mellitus with diabetic polyneuropathy: Secondary | ICD-10-CM | POA: Diagnosis not present

## 2016-05-26 DIAGNOSIS — I10 Essential (primary) hypertension: Secondary | ICD-10-CM | POA: Insufficient documentation

## 2016-05-26 DIAGNOSIS — M545 Low back pain: Secondary | ICD-10-CM | POA: Insufficient documentation

## 2016-05-26 MED ORDER — OXYCODONE HCL 10 MG PO TABS: 10.0000 mg | ORAL_TABLET | Freq: Four times a day (QID) | ORAL | 0 refills | Status: DC | PRN

## 2016-05-27 NOTE — Progress Notes (Signed)
Subjective:  Patient ID: Kaci Lassley, male    DOB: 07-Dec-1960  Age: 55 y.o. MRN: [id number]  CC: Back Pain (low and left) and Neck Pain Procedure: None  Previous Procedure: Left side L3-4 L4-5 L5-S1 #2 intra-articular facet injection under fluoroscopic guidance with no sedation  HPI Coda Choto presents for reevaluation. He has done well following his previous facet blocks. The quality characteristic distribution of his pain have remained stable but diminished in nature and remain left-sided only.[name] He is taking his medications as prescribed and denies any diverting or illicit use. This regimen keeps his pain under good control based on his narcotic assessment sheet. His urine tox screens have been appropriate as well. He has had some occasional reoccurrence of the lower back pain for which she received his facet blocks and he would like to proceed with an additional injection in the next 2-3 months.Darral Dash given him approximately 70% relief with his low back pain and he feels that his pain is much more moderate. Medications that he is taking for pain are working better and he sleeping better with better pain control throughout the day. He denies any problems with the medications presently and denies diverting or illicit use. Based on his narcotic assessment sheet he is doing well  Procedure history: Facet block in January March and April 2017 ---------------------------------------------------------------------------------------------------------------------- Past Medical History:  Diagnosis Date  . Allergy    dust, seasonal (worse in the fall).  . Anginal pain (HCC)    1 OR 2 x IN 3 MONTHS  . 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 Followed by Pain Specialist in [place], back and neck  . Asthma    BRONCHITIS  . Cataract    First Dx in 2012  . Coronary artery disease    Has 10 stents, last placed 2 year ago, Dr Karle Starch in January  . Diabetes mellitus without complication ([place])     TYPE 2  . Diabetic peripheral neuropathy (HCC)    feet and hands  . GERD (gastroesophageal reflux disease)   . Headache    muscle tension  . Hyperlipidemia   . Hypertension    CONTROLLED ON MEDS  . Insomnia   . Lyme disease    Chronic  . Myasthenia gravis ([place])    ? Just eyes or systemic  . Myasthenia gravis ([place])   . Myocardial infarction ([place]) 2010  . Seasonal allergies   . Shortness of breath dyspnea    EXERTION  . Sleep apnea    CPAP    Past Surgical History:  Procedure Laterality Date  . BILATERAL CARPAL TUNNEL RELEASE Bilateral L in 2012 and R in 2013  . CARDIAC CATHETERIZATION     Several Caths, most recent in  March 2016.  [name] COLONOSCOPY WITH PROPOFOL N/A 01/10/2016   Procedure: COLONOSCOPY WITH PROPOFOL;  Surgeon: [name], MD;  Location: [facility] ENDOSCOPY;  Service: Endoscopy;  Laterality: N/A;  . CORONARY ANGIOPLASTY    . ESOPHAGOGASTRODUODENOSCOPY (EGD) WITH PROPOFOL N/A 01/10/2016   Procedure: ESOPHAGOGASTRODUODENOSCOPY (EGD) WITH PROPOFOL;  Surgeon: [name], MD;  Location: [facility] ENDOSCOPY;  Service: Endoscopy;  Laterality: N/A;  . EYE SURGERY Bilateral 2012   cataract/bilateral vitrectomies  . TONSILLECTOMY AND ADENOIDECTOMY     As a child  . TUNNELED VENOUS CATHETER PLACEMENT     removed    Family History  Problem Relation Age of Onset  . Diabetes Mother   . Heart disease Mother   . Cancer Father     Prostate  CA    Social History  Substance Use Topics  . Smoking status: Never Smoker  . Smokeless tobacco: Never Used  . Alcohol use 0.0 oz/week     Comment: occasional/1-2 BEERS PER YEAR    ---------------------------------------------------------------------------------------------------------------------- Social History   Social History  . Marital status: Married    Spouse name: N/A  . Number of children: N/A  . Years of education: N/A   Social History Main Topics  . Smoking status: Never Smoker  . Smokeless tobacco: Never Used  .  Alcohol use 0.0 oz/week     Comment: occasional/1-2 BEERS PER YEAR  . Drug use: No  . Sexual activity: No   Other Topics Concern  . None   Social History Narrative  . None      ----------------------------------------------------------------------------------------------------------------------  ROS Review of Systems   Cardiac is positive for high blood pressure chest pain previous heart attack status post stent Pulmonary positive for history of asthma and sleep apnea Neurologic is positive for Lyme disease myasthenia gravis and peripheral neuropathy Psychological is positive for depression Gastrointestinal positive for reflux Endocrine is positive for diabetes   Objective:  BP (!) 144/77 (BP Location: Left Arm, Patient Position: Sitting, Cuff Size: Normal)   Pulse (!) 111   Temp 98.3 F (36.8 C) (Oral)   Resp 16   Ht 5\' 7"  (1.702 m)   Wt 252 lb (114.3 kg)   SpO2 99%   BMI 39.47 kg/m   Physical Exam Patient is alert oriented cooperative compliant and a good historian Heart is regular rate and rhythm without murmur Lungs show extremity wheezing worse on the right than left Inspection of his low back diminished back spasming based on comparative to baselineOtherwise no changes on examination are noted   Assessment & Plan:   Elam was seen today for back pain and neck pain.  Diagnoses and all orders for this visit:  Facet arthritis of lumbosacral region  DDD (degenerative disc disease), lumbar  Other orders -     Discontinue: Oxycodone HCl 10 MG TABS; Take 1 tablet (10 mg total) by mouth 4 (four) times daily as needed. -     Oxycodone HCl 10 MG TABS; Take 1 tablet (10 mg total) by mouth 4 (four) times daily as needed.     ----------------------------------------------------------------------------------------------------------------------  Problem List Items Addressed This Visit    None    Visit Diagnoses    Facet arthritis of lumbosacral region     -  Primary   Relevant Medications   Oxycodone HCl 10 MG TABS   DDD (degenerative disc disease), lumbar       Relevant Medications   Oxycodone HCl 10 MG TABS      ----------------------------------------------------------------------------------------------------------------------  1.  Facet arthritis of lumbosacral region We will Defer on repeat injection today. He has  been scheduled for return in approximately 2 months and we will plan on repeat injection at that time. We have discussed options regarding radiofrequency ablation if necessary in the future. 2. DDD (degenerative disc disease), lumbar We will refill his medications as well today.   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 Myasthenia gravis ([place]) Continue follow-up with Dr. Melrose Nakayama  5. Cervicalgia I discussed some options with him regarding stretching strengthening exercises for his neck pain. This seems to be stable in nature with no complaints of numbness or tingling affecting the upper extremities no loss of strength noted per patient. If symptoms change we will consider a neurosurgical evaluation and further studies if  indicated.    ----------------------------------------------------------------------------------------------------------------------  I am having Mr. Mellor maintain his insulin glargine, insulin lispro, pyridostigmine, bisoprolol-hydrochlorothiazide, nitroGLYCERIN, Loratadine (CLARITIN PO), albuterol, augmented betamethasone dipropionate, glucose blood, gabapentin, gabapentin, zolpidem, rosuvastatin, metFORMIN, cyanocobalamin, aspirin, fluticasone-salmeterol, albuterol, amLODipine, pantoprazole, cyclobenzaprine, bisoprolol-hydrochlorothiazide, montelukast, LANTUS SOLOSTAR, and Oxycodone HCl. We will continue to administer lidocaine (PF), midazolam, ropivacaine (PF) 2 mg/ml (0.2%), sodium chloride flush, triamcinolone acetonide, triamcinolone acetonide, sodium chloride flush, ropivacaine (PF) 2 mg/ml  (0.2%), lidocaine (PF), triamcinolone acetonide, ropivacaine (PF) 2 mg/ml (0.2%), and lidocaine (PF).   Meds ordered this encounter  Medications  . DISCONTD: Oxycodone HCl 10 MG TABS    Sig: Take 1 tablet (10 mg total) by mouth 4 (four) times daily as needed.    Dispense:  120 tablet    Refill:  0    Do not fill until [id number]  . Oxycodone HCl 10 MG TABS    Sig: Take 1 tablet (10 mg total) by mouth 4 (four) times daily as needed.    Dispense:  120 tablet    Refill:  0    Do not fill until [id number]       Follow-up: Return in about 2 months (around 07/26/2016) for evaluation, med refill.   Molli Barrows, MD

## 2016-06-17 ENCOUNTER — Encounter: Payer: Self-pay | Admitting: Family Medicine

## 2016-06-17 ENCOUNTER — Ambulatory Visit (INDEPENDENT_AMBULATORY_CARE_PROVIDER_SITE_OTHER): Payer: Medicare Other | Admitting: Family Medicine

## 2016-06-17 VITALS — BP 140/76 | HR 105 | Temp 98.3°F | Resp 16 | Ht 67.0 in | Wt 253.3 lb

## 2016-06-17 DIAGNOSIS — J209 Acute bronchitis, unspecified: Secondary | ICD-10-CM

## 2016-06-17 DIAGNOSIS — R6889 Other general symptoms and signs: Secondary | ICD-10-CM | POA: Diagnosis not present

## 2016-06-17 LAB — POCT INFLUENZA A/B
INFLUENZA B, POC: NEGATIVE
Influenza A, POC: NEGATIVE

## 2016-06-17 MED ORDER — PREDNISONE 10 MG PO TABS: 10.0000 mg | ORAL_TABLET | Freq: Every day | ORAL | 0 refills | Status: DC

## 2016-06-17 MED ORDER — AZITHROMYCIN 250 MG PO TABS: ORAL_TABLET | ORAL | 0 refills | Status: DC

## 2016-06-17 NOTE — Progress Notes (Signed)
Name: [name]   MRN: [id number]    DOB: 1961-04-02   Date:06/17/2016       Progress Note  Subjective  Chief Complaint  Chief Complaint  Patient presents with  . Influenza    symptoms  . Wheezing    x1 week    Cough  This is a new problem. The current episode started in the past 7 days. The cough is productive of sputum. Associated symptoms include chest pain (chest congestion), myalgias and wheezing. Pertinent negatives include no chills, fever or sore throat. He has tried OTC cough suppressant for the symptoms.    Past Medical History:  Diagnosis Date  . Allergy    dust, seasonal (worse in the fall).  . Anginal pain (HCC)    1 OR 2 x IN 3 MONTHS  . 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 Followed by Pain Specialist in [place], back and neck  . Asthma    BRONCHITIS  . Cataract    First Dx in 2012  . Coronary artery disease    Has 10 stents, last placed 2 year ago, Dr Karle Starch in January  . Diabetes mellitus without complication ([place])    TYPE 2  . Diabetic peripheral neuropathy (HCC)    feet and hands  . GERD (gastroesophageal reflux disease)   . Headache    muscle tension  . Hyperlipidemia   . Hypertension    CONTROLLED ON MEDS  . Insomnia   . Lyme disease    Chronic  . Myasthenia gravis ([place])    ? Just eyes or systemic  . Myasthenia gravis ([place])   . Myocardial infarction ([place]) 2010  . Seasonal allergies   . Shortness of breath dyspnea    EXERTION  . Sleep apnea    CPAP    Past Surgical History:  Procedure Laterality Date  . BILATERAL CARPAL TUNNEL RELEASE Bilateral L in 2012 and R in 2013  . CARDIAC CATHETERIZATION     Several Caths, most recent in  March 2016.  [name] COLONOSCOPY WITH PROPOFOL N/A 01/10/2016   Procedure: COLONOSCOPY WITH PROPOFOL;  Surgeon: [name], MD;  Location: [facility] END[facility];  Service: Endoscopy;  Laterality: N/A;  . CORONARY ANGIOPLASTY    . ESOPHAGOGASTRODUODENOSCOPY (EGD) WITH PROPOFOL N/A 01/10/2016   Procedure:  ESOPHAGOGASTRODUODENOSCOPY (EGD) WITH PROPOFOL;  Surgeon: [name], MD;  Location: [facility];  Service: Endoscopy;  Laterality: N/A;  . EYE SURGERY Bilateral 2012   cataract/bilateral vitrectomies  . TONSILLECTOMY AND ADENOIDECTOMY     As a child  . TUNNELED VENOUS CATHETER PLACEMENT     removed    Family History  Problem Relation Age of Onset  . Diabetes Mother   . Heart disease Mother   . Cancer Father     Prostate CA    Social History   Social History  . Marital status: Married    Spouse name: N/A  . Number of children: N/A  . Years of education: N/A   Occupational History  . Not on file.   Social History Main Topics  . Smoking status: Never Smoker  . Smokeless tobacco: Never Used  . Alcohol use 0.0 oz/week     Comment: occasional/1-2 BEERS PER YEAR  . Drug use: No  . Sexual activity: No   Other Topics Concern  . Not on file   Social History Narrative  . No narrative on file     Current Outpatient Prescriptions:  .  albuterol (PROVENTIL HFA;VENTOLIN HFA) 108 (90 Base) MCG/ACT inhaler,  Inhale 2 puffs into the lungs every 6 (six) hours as needed for wheezing or shortness of breath., Disp: , Rfl:  .  albuterol (PROVENTIL) (2.5 MG/3ML) 0.083% nebulizer solution, Take 2.5 mg by nebulization every 6 (six) hours as needed for wheezing or shortness of breath., Disp: , Rfl:  .  amLODipine (NORVASC) 5 MG tablet, Take 1 tablet (5 mg total) by mouth daily. am, Disp: 30 tablet, Rfl: 6 .  aspirin 325 MG tablet, Take 325 mg by mouth daily. AM, Disp: , Rfl:  .  augmented betamethasone dipropionate (DIPROLENE-AF) 0.05 % ointment, Apply topically 2 (two) times daily., Disp: 45 g, Rfl: 0 .  bisoprolol-hydrochlorothiazide (ZIAC) 2.5-6.25 MG tablet, Take 1 tablet by mouth daily. am, Disp: , Rfl:  .  bisoprolol-hydrochlorothiazide (ZIAC) 5-6.25 MG tablet, take 1/2 tablet by mouth once daily, Disp: 15 tablet, Rfl: 3 .  cyanocobalamin 1000 MCG tablet, Take 1,000 mcg by mouth  daily. am, Disp: , Rfl:  .  cyclobenzaprine (FLEXERIL) 10 MG tablet, Take 1 tablet (10 mg total) by mouth 3 (three) times daily as needed for muscle spasms., Disp: 90 tablet, Rfl: 5 .  fluticasone-salmeterol (ADVAIR HFA) 230-21 MCG/ACT inhaler, Inhale 1 puff into the lungs 2 (two) times daily. Reported on 01/09/2016, Disp: , Rfl:  .  gabapentin (NEURONTIN) 300 MG capsule, Take 1 capsule (300 mg total) by mouth 3 (three) times daily., Disp: 90 capsule, Rfl: 1 .  gabapentin (NEURONTIN) 800 MG tablet, Take 1 tablet (800 mg total) by mouth at bedtime., Disp: 90 tablet, Rfl: 0 .  glucose blood (BAYER CONTOUR TEST) test strip, Use as instructed, Disp: 100 each, Rfl: 2 .  insulin glargine (LANTUS) 100 UNIT/ML injection, Inject 40 Units into the skin at bedtime. , Disp: , Rfl:  .  insulin lispro (HUMALOG KWIKPEN) 100 UNIT/ML KiwkPen, Inject 8 Units into the skin 3 (three) times daily. Reported on 12/02/2015/ sliding scale, Disp: , Rfl:  .  LANTUS SOLOSTAR 100 UNIT/ML Solostar Pen, , Disp: , Rfl: 0 .  Loratadine (CLARITIN PO), Take by mouth daily. am, Disp: , Rfl:  .  metFORMIN (GLUCOPHAGE) 1000 MG tablet, Take 1,000 mg by mouth 2 (two) times daily with a meal., Disp: , Rfl:  .  montelukast (SINGULAIR) 10 MG tablet, , Disp: , Rfl: 1 .  nitroGLYCERIN (NITROSTAT) 0.3 MG SL tablet, Place 0.3 mg under the tongue every 5 (five) minutes as needed for chest pain. Reported on 12/02/2015, Disp: , Rfl:  .  Oxycodone HCl 10 MG TABS, Take 1 tablet (10 mg total) by mouth 4 (four) times daily as needed., Disp: 120 tablet, Rfl: 0 .  pantoprazole (PROTONIX) 40 MG tablet, Take 1 tablet (40 mg total) by mouth 2 (two) times daily., Disp: 60 tablet, Rfl: 11 .  pyridostigmine (MESTINON) 60 MG tablet, Take 60 mg by mouth 3 (three) times daily. , Disp: , Rfl:  .  rosuvastatin (CRESTOR) 5 MG tablet, Take 2.5 mg by mouth daily at 6 PM. , Disp: , Rfl:  .  zolpidem (AMBIEN) 5 MG tablet, Take 2 tablets (10 mg total) by mouth at bedtime as  needed for sleep. (Patient taking differently: Take 5 mg by mouth at bedtime as needed for sleep. ), Disp: 30 tablet, Rfl: 2  Current Facility-Administered Medications:  .  lidocaine (PF) (XYLOCAINE) 1 % injection 5 mL, 5 mL, Subcutaneous, Once, Molli Barrows, MD .  lidocaine (PF) (XYLOCAINE) 1 % injection 5 mL, 5 mL, Subcutaneous, Once, Molli Barrows, MD .  lidocaine (PF) (XYLOCAINE) 1 % injection 5 mL, 5 mL, Subcutaneous, Once, Molli Barrows, MD .  midazolam (VERSED) injection 5 mg, 5 mg, Intravenous, Once, Molli Barrows, MD .  ropivacaine (PF) 2 mg/ml (0.2%) (NAROPIN) epidural 10 mL, 10 mL, Epidural, Once, Molli Barrows, MD .  ropivacaine (PF) 2 mg/ml (0.2%) (NAROPIN) epidural 10 mL, 10 mL, Epidural, Once, Molli Barrows, MD .  ropivacaine (PF) 2 mg/ml (0.2%) (NAROPIN) epidural 10 mL, 10 mL, Epidural, Once, Molli Barrows, MD .  sodium chloride flush (NS) 0.9 % injection 10 mL, 10 mL, Other, Once, Molli Barrows, MD .  sodium chloride flush (NS) 0.9 % injection 10 mL, 10 mL, Other, Once, Molli Barrows, MD .  triamcinolone acetonide (KENALOG-40) injection 40 mg, 40 mg, Other, Once, Molli Barrows, MD .  triamcinolone acetonide (KENALOG-40) injection 40 mg, 40 mg, Other, Once, Molli Barrows, MD .  triamcinolone acetonide (KENALOG-40) injection 40 mg, 40 mg, Other, Once, Molli Barrows, MD  Allergies  Allergen Reactions  . Novolog [Insulin Aspart] Hives     Review of Systems  Constitutional: Negative for chills and fever.  HENT: Negative for sore throat.   Respiratory: Positive for cough and wheezing.   Cardiovascular: Positive for chest pain (chest congestion).  Musculoskeletal: Positive for myalgias.    Objective  Vitals:   06/17/16 0818  BP: 140/76  Pulse: (!) 105  Resp: 16  Temp: 98.3 F (36.8 C)  TempSrc: Oral  SpO2: 94%  Weight: 253 lb 4.8 oz (114.9 kg)  Height: 5\' 7"  (1.702 m)    Physical Exam  Constitutional: He is well-developed, well-nourished, and in no distress.   HENT:  Head: Normocephalic and atraumatic.  Mouth/Throat: Oropharynx is clear and moist. No oropharyngeal exudate or posterior oropharyngeal erythema.  Cardiovascular: Normal rate, regular rhythm and normal heart sounds.   Pulmonary/Chest: He has decreased breath sounds. He has wheezes in the right middle field, the right lower field, the left middle field and the left lower field. He has no rales.  Nursing note and vitals reviewed.    Assessment & Plan  1. Acute bronchitis, unspecified organism Patient encouraged to check his sugar regularly while taking prednisone, started on azithromycin and obtain chest x-ray to rule out pneumonia - DG Chest 2 View; Future - azithromycin (ZITHROMAX) 250 MG tablet; 2 tabs po day 1, then 1 tab po q day x 4 days  Dispense: 6 each; Refill: 0 - predniSONE (DELTASONE) 10 MG tablet; Take 1 tablet (10 mg total) by mouth daily with breakfast.  Dispense: 21 tablet; Refill: 0   Raydan Schlabach [facility] 06/17/2016 8:57 AM

## 2016-07-27 ENCOUNTER — Other Ambulatory Visit: Payer: Self-pay | Admitting: Cardiovascular Disease

## 2016-07-29 ENCOUNTER — Ambulatory Visit
Admission: RE | Admit: 2016-07-29 | Discharge: 2016-07-29 | Disposition: A | Discharge status: Home / Self Care | Payer: Medicare Other | Source: Ambulatory Visit | Attending: Anesthesiology | Admitting: Anesthesiology

## 2016-07-29 ENCOUNTER — Encounter: Payer: Self-pay | Admitting: Anesthesiology

## 2016-07-29 ENCOUNTER — Ambulatory Visit (HOSPITAL_BASED_OUTPATIENT_CLINIC_OR_DEPARTMENT_OTHER): Payer: Medicare Other | Admitting: Anesthesiology

## 2016-07-29 ENCOUNTER — Other Ambulatory Visit: Payer: Self-pay | Admitting: Anesthesiology

## 2016-07-29 DIAGNOSIS — M47817 Spondylosis without myelopathy or radiculopathy, lumbosacral region: Secondary | ICD-10-CM

## 2016-07-29 DIAGNOSIS — R52 Pain, unspecified: Secondary | ICD-10-CM

## 2016-07-29 DIAGNOSIS — M4697 Unspecified inflammatory spondylopathy, lumbosacral region: Secondary | ICD-10-CM

## 2016-07-29 DIAGNOSIS — M545 Low back pain: Secondary | ICD-10-CM | POA: Diagnosis not present

## 2016-07-29 MED ORDER — TRIAMCINOLONE ACETONIDE 40 MG/ML IJ SUSP
INTRAMUSCULAR | Status: AC
  Administered 2016-07-29: 15:00:00
  Filled 2016-07-29: qty 1

## 2016-07-29 MED ORDER — ROPIVACAINE HCL 2 MG/ML IJ SOLN: 10.0000 mL | Freq: Once | INTRAMUSCULAR | Status: DC

## 2016-07-29 MED ORDER — TRIAMCINOLONE ACETONIDE 40 MG/ML IJ SUSP: 40.0000 mg | Freq: Once | INTRAMUSCULAR | Status: DC

## 2016-07-29 MED ORDER — OXYCODONE HCL 10 MG PO TABS: 10.0000 mg | ORAL_TABLET | Freq: Four times a day (QID) | ORAL | 0 refills | Status: DC | PRN

## 2016-07-29 MED ORDER — LIDOCAINE HCL (PF) 1 % IJ SOLN
INTRAMUSCULAR | Status: AC
  Administered 2016-07-29: 15:00:00
  Filled 2016-07-29: qty 5

## 2016-07-29 MED ORDER — ROPIVACAINE HCL 2 MG/ML IJ SOLN
INTRAMUSCULAR | Status: AC
  Administered 2016-07-29: 15:00:00
  Filled 2016-07-29: qty 10

## 2016-07-29 MED ORDER — LIDOCAINE HCL (PF) 1 % IJ SOLN: 5.0000 mL | Freq: Once | INTRAMUSCULAR | Status: DC

## 2016-07-29 NOTE — Patient Instructions (Signed)
Pain Management Discharge Instructions  General Discharge Instructions :  If you need to reach your doctor call: Monday-Friday 8:00 am - 4:00 pm at [phone] or toll free [phone].  After clinic hours [phone] to have operator reach doctor.  Bring all of your medication bottles to all your appointments in the pain clinic.  To cancel or reschedule your appointment with Pain Management please remember to call 24 hours in advance to avoid a fee.  Refer to the educational materials which you have been given on: General Risks, I had my Procedure. Discharge Instructions, Post Sedation.  Post Procedure Instructions:  The drugs you were given will stay in your system until tomorrow, so for the next 24 hours you should not drive, make any legal decisions or drink any alcoholic beverages.  You may eat anything you prefer, but it is better to start with liquids then soups and crackers, and gradually work up to solid foods.  Please notify your doctor immediately if you have any unusual bleeding, trouble breathing or pain that is not related to your normal pain.  Depending on the type of procedure that was done, some parts of your body may feel week and/or numb.  This usually clears up by tonight or the next day.  Walk with the use of an assistive device or accompanied by an adult for the 24 hours.  You may use ice on the affected area for the first 24 hours.  Put ice in a Ziploc bag and cover with a towel and place against area 15 minutes on 15 minutes off.  You may switch to heat after 24 hours.Facet Joint Block, Care After Refer to this sheet in the next few weeks. These instructions provide you with information on caring for yourself after your procedure. Your health care provider may also give you more specific instructions. Your treatment has been planned according to current medical practices, but problems sometimes occur. Call your health care provider if you have any problems or  questions after your procedure. HOME CARE INSTRUCTIONS   Keep track of the amount of pain relief you feel and how long it lasts.  Limit pain medicine within the first 4-6 hours after the procedure as directed by your health care provider.  Resume taking dietary supplements and medicines as directed by your health care provider.  You may resume your regular diet.  Do not apply heat near or over the injection site(s) for 24 hours.   Do not take a bath or soak in water (such as a pool or lake) for 24 hours.  Do not drive for 24 hours unless approved by your health care provider.  Avoid strenuous activity for 24 hours.  Remove your bandages the morning after the procedure.   If the injection site is tender, applying an ice pack may relieve some tenderness. To do this:  Put ice in a bag.  Place a towel between your skin and the bag.  Leave the ice on for 15-20 minutes, 3-4 times a day.  Keep follow-up appointments as directed by your health care provider. SEEK MEDICAL CARE IF:   Your pain is not controlled by your medicines.   There is drainage from the injection site.   There is significant bleeding or swelling at the injection site.  You have diabetes and your blood sugar is above 180 mg/dL. SEEK IMMEDIATE MEDICAL CARE IF:   You develop a fever of 101F (38.3C) or greater.   You have worsening pain or swelling around   the injection site.   You have red streaking around the injection site.   You develop severe pain that is not controlled by your medicines.   You develop a headache, stiff neck, nausea, or vomiting.   Your eyes become very sensitive to light.   You have weakness, paralysis, or tingling in your arms or legs that was not present before the procedure.   You develop difficulty urinating or breathing.    This information is not intended to replace advice given to you by your health care provider. Make sure you discuss any questions you have  with your health care provider.   Document Released: 09/07/2012 Document Revised: 10/12/2014 Document Reviewed: 09/07/2012 Elsevier Interactive Patient Education 2016 [employer] Facet Joint Block The facet joints connect the bones of the spine (vertebrae). They make it possible for you to bend, twist, and make other movements with your spine. They also prevent you from overbending, overtwisting, and making other excessive movements.  A facet joint block is a procedure where a numbing medicine (anesthetic) is injected into a facet joint. Often, a type of anti-inflammatory medicine called a steroid is also injected. A facet joint block may be done for two reasons:   Diagnosis. A facet joint block may be done as a test to see whether neck or back pain is caused by a worn-down or infected facet joint. If the pain gets better after a facet joint block, it means the pain is probably coming from the facet joint. If the pain does not get better, it means the pain is probably not coming from the facet joint.   Therapy. A facet joint block may be done to relieve neck or back pain caused by a facet joint. A facet joint block is only done as a therapy if the pain does not improve with medicine, exercise programs, physical therapy, and other forms of pain management. LET YOUR HEALTH CARE PROVIDER KNOW ABOUT:   Any allergies you have.   All medicines you are taking, including vitamins, herbs, eyedrops, and over-the-counter medicines and creams.   Previous problems you or members of your family have had with the use of anesthetics.   Any blood disorders you have had.   Other health problems you have. RISKS AND COMPLICATIONS Generally, having a facet joint block is safe. However, as with any procedure, complications can occur. Possible complications associated with having a facet joint block include:   Bleeding.   Injury to a nerve near the injection site.   Pain at the injection site.    Weakness or numbness in areas controlled by nerves near the injection site.   Infection.   Temporary fluid retention.   Allergic reaction to anesthetics or medicines used during the procedure. BEFORE THE PROCEDURE   Follow your health care provider's instructions if you are taking dietary supplements or medicines. You may need to stop taking them or reduce your dosage.   Do not take any new dietary supplements or medicines without asking your health care provider first.   Follow your health care provider's instructions about eating and drinking before the procedure. You may need to stop eating and drinking several hours before the procedure.   Arrange to have an adult drive you home after the procedure. PROCEDURE  You may need to remove your clothing and dress in an open-back gown so that your health care provider can access your spine.   The procedure will be done while you are lying on an X-ray table. Most   of the time you will be asked to lie on your stomach, but you may be asked to lie in a different position if an injection will be made in your neck.   Special machines will be used to monitor your oxygen levels, heart rate, and blood pressure.   If an injection will be made in your neck, an intravenous (IV) tube will be inserted into one of your veins. Fluids and medicine will flow directly into your body through the IV tube.   The area over the facet joint where the injection will be made will be cleaned with an antiseptic soap. The surrounding skin will be covered with sterile drapes.   An anesthetic will be applied to your skin to make the injection area numb. You may feel a temporary stinging or burning sensation.   A video X-ray machine will be used to locate the joint. A contrast dye may be injected into the facet joint area to help with locating the joint.   When the joint is located, an anesthetic medicine will be injected into the joint through the  needle.   Your health care provider will ask you whether you feel pain relief. If you do feel relief, a steroid may be injected to provide pain relief for a longer period of time. If you do not feel relief or feel only partial relief, additional injections of an anesthetic may be made in other facet joints.   The needle will be removed, the skin will be cleansed, and bandages will be applied.  AFTER THE PROCEDURE   You will be observed for 15-30 minutes before being allowed to go home. Do not drive. Have an adult drive you or take a taxi or public transportation instead.   If you feel pain relief, the pain will return in several hours or days when the anesthetic wears off.   You may feel pain relief 2-14 days after the procedure. The amount of time this relief lasts varies from person to person.   It is normal to feel some tenderness over the injected area(s) for 2 days following the procedure.   If you have diabetes, you may have a temporary increase in blood sugar.   This information is not intended to replace advice given to you by your health care provider. Make sure you discuss any questions you have with your health care provider.   Document Released: 02/10/2007 Document Revised: 10/12/2014 Document Reviewed: 07/11/2012 Elsevier Interactive Patient Education 2016 Elsevier Inc.  

## 2016-07-29 NOTE — Progress Notes (Signed)
Safety precautions to be maintained throughout the outpatient stay will include: orient to surroundings, keep bed in low position, maintain call bell within reach at all times, provide assistance with transfer out of bed and ambulation.  

## 2016-07-30 ENCOUNTER — Telehealth: Payer: Self-pay

## 2016-07-30 NOTE — Progress Notes (Signed)
Subjective:  Patient ID: [name], male    DOB: 1960/12/13  Age: 55 y.o. MRN: [id number]  CC: Back Pain (low and left) Procedure: Medial branch block under fluoroscopic guidance with L5-S1 intra-articular injection left side without sedation  Previous Procedure: Left side L3-4 L4-5 L5-S1 intra-articular facet injection under fluoroscopic guidance with no sedation                                     HPI Procedure history: Temp presents for reevaluation today. He has done well with previous facet injections giving him at least to 3 months of substantial relief of his low back pain. That pain has been recalcitrant in nature and he presents today requesting a left side lumbar facet injection. The quality characteristic and distribution of the pain have been stable in nature and he has failed conservative therapy. Historically he has used these injections to keep his pain under good control and he continues on his current medication regimen and based on his narcotic assessment sheet he is tolerating that well. He denies any lower extremity loss of strength or function or changes in bowel or bladder function. These have remained stable. ---------------------------------------------------------------------------------------------------------------------- Past Medical History:  Diagnosis Date  . Allergy    dust, seasonal (worse in the fall).  . Anginal pain (HCC)    1 OR 2 x IN 3 MONTHS  . 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 Followed by Pain Specialist in [place], back and neck  . Asthma    BRONCHITIS  . Cataract    First Dx in 2012  . Coronary artery disease    Has 10 stents, last placed 2 year ago, Dr Karle Starch in January  . Diabetes mellitus without complication ([place])    TYPE 2  . Diabetic peripheral neuropathy (HCC)    feet and hands  . GERD (gastroesophageal reflux disease)   . Headache    muscle tension  . Hyperlipidemia   . Hypertension    CONTROLLED ON MEDS  . Insomnia    . Lyme disease    Chronic  . Myasthenia gravis ([place])    ? Just eyes or systemic  . Myasthenia gravis ([place])   . Myocardial infarction 2010  . Seasonal allergies   . Shortness of breath dyspnea    EXERTION  . Sleep apnea    CPAP    Past Surgical History:  Procedure Laterality Date  . BILATERAL CARPAL TUNNEL RELEASE Bilateral L in 2012 and R in 2013  . CARDIAC CATHETERIZATION     Several Caths, most recent in  March 2016.  [name] COLONOSCOPY WITH PROPOFOL N/A 01/10/2016   Procedure: COLONOSCOPY WITH PROPOFOL;  Surgeon: [name], MD;  Location: [facility] ENDOSCOPY;  Service: Endoscopy;  Laterality: N/A;  . CORONARY ANGIOPLASTY    . ESOPHAGOGASTRODUODENOSCOPY (EGD) WITH PROPOFOL N/A 01/10/2016   Procedure: ESOPHAGOGASTRODUODENOSCOPY (EGD) WITH PROPOFOL;  Surgeon: [name], MD;  Location: [facility] ENDOSCOPY;  Service: Endoscopy;  Laterality: N/A;  . EYE SURGERY Bilateral 2012   cataract/bilateral vitrectomies  . TONSILLECTOMY AND ADENOIDECTOMY     As a child  . TUNNELED VENOUS CATHETER PLACEMENT     removed    Family History  Problem Relation Age of Onset  . Diabetes Mother   . Heart disease Mother   . Cancer Father     Prostate CA    Social History  Substance Use Topics  . Smoking status: Never  Smoker  . Smokeless tobacco: Never Used  . Alcohol use 0.0 oz/week     Comment: occasional/1-2 BEERS PER YEAR    ---------------------------------------------------------------------------------------------------------------------- Social History   Social History  . Marital status: Married    Spouse name: N/A  . Number of children: N/A  . Years of education: N/A   Social History Main Topics  . Smoking status: Never Smoker  . Smokeless tobacco: Never Used  . Alcohol use 0.0 oz/week     Comment: occasional/1-2 BEERS PER YEAR  . Drug use: No  . Sexual activity: No   Other Topics Concern  . None   Social History Narrative  . None       ----------------------------------------------------------------------------------------------------------------------  ROS Review of Systems   No changes mentioned  Objective:  BP (!) 150/76   Pulse 93   Temp 98.1 F (36.7 C) (Oral)   Resp 16   Ht 5\' 7"  (1.702 m)   Wt 252 lb (114.3 kg)   SpO2 94%   BMI 39.47 kg/m   Physical Exam Patient is alert oriented cooperative compliant and a good historian Heart is regular rate and rhythm without murmur Lungs show extremity wheezing worse on the right than left Inspection of his low back diminished back spasming based on comparative to baseline..Otherwise no changes on examination are noted with his strength being at baseline. He continues to have pain on standing with left lateral rotation and extension at the low back.   Assessment & Plan:   Valerio was seen today for back pain.  Diagnoses and all orders for this visit:  Facet arthritis of lumbosacral region ([place]) -     LUMBAR FACET(MEDIAL BRANCH NERVE BLOCK) MBNB -     triamcinolone acetonide (KENALOG-40) injection 40 mg; 1 mL (40 mg total) by Other route once. -     ropivacaine (PF) 2 mg/ml (0.2%) (NAROPIN) epidural 10 mL; 10 mLs by Epidural route once. -     lidocaine (PF) (XYLOCAINE) 1 % injection 5 mL; Inject 5 mLs into the skin once.  Other orders -     ropivacaine (PF) 2 mg/ml (0.2%) (NAROPIN) 2 MG/ML epidural;  -     lidocaine (PF) (XYLOCAINE) 1 % injection;  -     triamcinolone acetonide (KENALOG-40) 40 MG/ML injection;  -     Discontinue: Oxycodone HCl 10 MG TABS; Take 1 tablet (10 mg total) by mouth 4 (four) times daily as needed. -     Oxycodone HCl 10 MG TABS; Take 1 tablet (10 mg total) by mouth 4 (four) times daily as needed. -     ToxASSURE Select 13 (MW), Urine     ----------------------------------------------------------------------------------------------------------------------  Problem List Items Addressed This Visit    None    Visit  Diagnoses    Facet arthritis of lumbosacral region [facility])       Relevant Medications   triamcinolone acetonide (KENALOG-40) 40 MG/ML injection (Completed)   triamcinolone acetonide (KENALOG-40) injection 40 mg   ropivacaine (PF) 2 mg/ml (0.2%) (NAROPIN) epidural 10 mL   lidocaine (PF) (XYLOCAINE) 1 % injection 5 mL   Oxycodone HCl 10 MG TABS      ----------------------------------------------------------------------------------------------------------------------  1.  Facet arthritis of lumbosacral region We will plan on a lumbar facet block to the left lumbar region at L3-4 L4-5 and L5-S1 today. He has done well with these in the past. The risks and benefits of an reviewed with him in full detail and all questions answered.   2. DDD (degenerative disc disease),  lumbar We will refill his medications as well today. We'll keep him on his current medications for his low back pain. He's been stable on this regimen with no problems noted.   3 Lyme arthritis of multiple joints ([place])  4. Myasthenia gravis ([place]) Continue follow-up with Dr. Melrose Nakayama  5. Cervicalgia    ----------------------------------------------------------------------------------------------------------------------  I am having Mr. Clardy maintain his insulin glargine, insulin lispro, pyridostigmine, bisoprolol-hydrochlorothiazide, nitroGLYCERIN, Loratadine (CLARITIN PO), albuterol, augmented betamethasone dipropionate, glucose blood, gabapentin, gabapentin, zolpidem, rosuvastatin, metFORMIN, cyanocobalamin, aspirin, fluticasone-salmeterol, albuterol, amLODipine, pantoprazole, cyclobenzaprine, montelukast, LANTUS SOLOSTAR, azithromycin, predniSONE, bisoprolol-hydrochlorothiazide, and Oxycodone HCl. We administered ropivacaine (PF) 2 mg/ml (0.2%), lidocaine (PF), and triamcinolone acetonide. We will continue to administer lidocaine (PF), midazolam, ropivacaine (PF) 2 mg/ml (0.2%), sodium chloride flush, triamcinolone acetonide,  triamcinolone acetonide, sodium chloride flush, ropivacaine (PF) 2 mg/ml (0.2%), lidocaine (PF), triamcinolone acetonide, ropivacaine (PF) 2 mg/ml (0.2%), lidocaine (PF), triamcinolone acetonide, ropivacaine (PF) 2 mg/ml (0.2%), and lidocaine (PF).   Meds ordered this encounter  Medications  . ropivacaine (PF) 2 mg/ml (0.2%) (NAROPIN) 2 MG/ML epidural    COCHRAN, HANNAH: cabinet override  . lidocaine (PF) (XYLOCAINE) 1 % injection    COCHRAN, HANNAH: cabinet override  . triamcinolone acetonide (KENALOG-40) 40 MG/ML injection    COCHRAN, HANNAH: cabinet override  . triamcinolone acetonide (KENALOG-40) injection 40 mg  . ropivacaine (PF) 2 mg/ml (0.2%) (NAROPIN) epidural 10 mL  . lidocaine (PF) (XYLOCAINE) 1 % injection 5 mL  . DISCONTD: Oxycodone HCl 10 MG TABS    Sig: Take 1 tablet (10 mg total) by mouth 4 (four) times daily as needed.    Dispense:  120 tablet    Refill:  0    Do not fill until [id number]  . Oxycodone HCl 10 MG TABS    Sig: Take 1 tablet (10 mg total) by mouth 4 (four) times daily as needed.    Dispense:  120 tablet    Refill:  0    Do not fill until [id number]    Procedure: Left side L3-4 L4-5 medial branch block to the lumbar facets and intra-articular injection at L5-S1 under fluoroscopy without sedation   Patient was taken to the fluoroscopy suite and placed in prone position. A total dose of 0 mg of Versed with 0 cc of fentanyl were titrated for moderate sedation. Vital signs were stable throughout the procedure. The  overlying area of skin on the  left side was prepped with Betadine 3 and strict aseptic technique was utilized throughout the procedure. Flouroscopy was used to I identify the areas overlying the aforementioned facets at L3-4  L4-5 and  L5-S1. 1% lidocaine 1 cc was infiltrated subcutaneously and into the fascia with a 25-gauge needle at each of these sites. I then advanced a 22-gauge 3-1/2 inch Quinckie needle with the needle tip to lie at the "[facility]" portion of the [employer] dog". There was negative aspiration for heme or CSF and  no paresthesia. I then injected 2 cc of ropivacaine 0.2% mixed with10 mg of triamcinolone at each of the aforementioned sites. These needles were withdrawn and the L5-S1 was placed intra-articularly. This was done without  paresthesia, there was negative aspiration and 2 cc of this same mixture was injected at this site. The needles were withdrawn and The patient was convalesced discharged home stable condition for follow-up as mentioned.     Follow-up: Return for evaluation, med refill.   Molli Barrows, MD

## 2016-07-30 NOTE — Telephone Encounter (Signed)
Post procedure call back. Left message with patient.

## 2016-08-07 ENCOUNTER — Other Ambulatory Visit: Payer: Self-pay | Admitting: Cardiovascular Disease

## 2016-08-07 DIAGNOSIS — I10 Essential (primary) hypertension: Secondary | ICD-10-CM

## 2016-08-08 LAB — TOXASSURE SELECT 13 (MW), URINE

## 2016-08-21 ENCOUNTER — Other Ambulatory Visit: Payer: Self-pay | Admitting: Anesthesiology

## 2016-08-24 NOTE — Telephone Encounter (Signed)
Called patient. Left voicemail for patient to call back re: refill of Flexeril.

## 2016-09-15 ENCOUNTER — Encounter: Payer: Self-pay | Admitting: Anesthesiology

## 2016-09-15 ENCOUNTER — Ambulatory Visit: Payer: Medicare Other | Attending: Anesthesiology | Admitting: Anesthesiology

## 2016-09-15 VITALS — BP 134/77 | Temp 98.0°F | Resp 16 | Ht 67.0 in | Wt 259.0 lb

## 2016-09-15 DIAGNOSIS — M542 Cervicalgia: Secondary | ICD-10-CM | POA: Diagnosis not present

## 2016-09-15 DIAGNOSIS — M4697 Unspecified inflammatory spondylopathy, lumbosacral region: Secondary | ICD-10-CM

## 2016-09-15 DIAGNOSIS — M545 Low back pain: Secondary | ICD-10-CM | POA: Diagnosis present

## 2016-09-15 DIAGNOSIS — M5136 Other intervertebral disc degeneration, lumbar region: Secondary | ICD-10-CM | POA: Diagnosis not present

## 2016-09-15 DIAGNOSIS — G7000 Myasthenia gravis without (acute) exacerbation: Secondary | ICD-10-CM | POA: Insufficient documentation

## 2016-09-15 DIAGNOSIS — A6923 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: Secondary | ICD-10-CM

## 2016-09-15 DIAGNOSIS — M4687 Other specified inflammatory spondylopathies, lumbosacral region: Secondary | ICD-10-CM | POA: Insufficient documentation

## 2016-09-15 DIAGNOSIS — M47817 Spondylosis without myelopathy or radiculopathy, lumbosacral region: Secondary | ICD-10-CM

## 2016-09-15 MED ORDER — OXYCODONE HCL 10 MG PO TABS: 10.0000 mg | ORAL_TABLET | Freq: Four times a day (QID) | ORAL | 0 refills | Status: DC | PRN

## 2016-09-15 MED ORDER — NALOXONE HCL 4 MG/0.1ML NA LIQD: NASAL | 2 refills | Status: AC

## 2016-09-15 NOTE — Progress Notes (Signed)
Safety precautions to be maintained throughout the outpatient stay will include: orient to surroundings, keep bed in low position, maintain call bell within reach at all times, provide assistance with transfer out of bed and ambulation.  

## 2016-09-16 NOTE — Progress Notes (Signed)
Subjective:  Patient ID: Stephanie Mcglone, male    DOB: 09-Dec-1960  Age: 55 y.o. MRN: [id number]  CC: Back Pain (left side from from back to left calf and neck pain has arthritis in neck) Procedure: None   Previous Procedure: October 2017 Medial branch block under fluoroscopic guidance with L5-S1 intra-articular injection left side without sedation  Previous Procedure: Left side L3-4 L4-5 L5-S1 intra-articular facet injection under fluoroscopic guidance with no sedation                                     HPI [name] presents for reevaluation last seen in October in which point he had a facet block. He has been doing well with his low back pain and this seemed to help with the left lower back spasming. He still takes his medications as prescribed and is tolerating these well and based on his narcotic assessment sheet these are working well for him with an improved overall lifestyle function. The quality characteristic and distribution of his low back pain are otherwise stable with no change in strength to the lower extremities or bowel bladder function. I reviewed the narcotic database and he is compliant. ---------------------------------------------------------------------------------------------------------------------- Past Medical History:  Diagnosis Date  . Allergy    dust, seasonal (worse in the fall).  . Anginal pain (HCC)    1 OR 2 x IN 3 MONTHS  . 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 Followed by Pain Specialist in [place], back and neck  . Asthma    BRONCHITIS  . Cataract    First Dx in 2012  . Coronary artery disease    Has 10 stents, last placed 2 year ago, Dr Karle Starch in January  . Diabetes mellitus without complication ([place])    TYPE 2  . Diabetic peripheral neuropathy (HCC)    feet and hands  . GERD (gastroesophageal reflux disease)   . Headache    muscle tension  . Hyperlipidemia   . Hypertension    CONTROLLED ON MEDS  . Insomnia   . Lyme disease    Chronic  .  Myasthenia gravis ([place])    ? Just eyes or systemic  . Myasthenia gravis ([place])   . Myocardial infarction 2010  . Seasonal allergies   . Shortness of breath dyspnea    EXERTION  . Sleep apnea    CPAP    Past Surgical History:  Procedure Laterality Date  . BILATERAL CARPAL TUNNEL RELEASE Bilateral L in 2012 and R in 2013  . CARDIAC CATHETERIZATION     Several Caths, most recent in  March 2016.  [name] COLONOSCOPY WITH PROPOFOL N/A 01/10/2016   Procedure: COLONOSCOPY WITH PROPOFOL;  Surgeon: [name], MD;  Location: [facility] ENDOSCOPY;  Service: Endoscopy;  Laterality: N/A;  . CORONARY ANGIOPLASTY    . ESOPHAGOGASTRODUODENOSCOPY (EGD) WITH PROPOFOL N/A 01/10/2016   Procedure: ESOPHAGOGASTRODUODENOSCOPY (EGD) WITH PROPOFOL;  Surgeon: [name], MD;  Location: [facility] ENDOSCOPY;  Service: Endoscopy;  Laterality: N/A;  . EYE SURGERY Bilateral 2012   cataract/bilateral vitrectomies  . TONSILLECTOMY AND ADENOIDECTOMY     As a child  . TUNNELED VENOUS CATHETER PLACEMENT     removed    Family History  Problem Relation Age of Onset  . Diabetes Mother   . Heart disease Mother   . Cancer Father     Prostate CA    Social History  Substance Use Topics  . Smoking status:  Never Smoker  . Smokeless tobacco: Never Used  . Alcohol use 0.0 oz/week     Comment: occasional/1-2 BEERS PER YEAR    ---------------------------------------------------------------------------------------------------------------------- Social History   Social History  . Marital status: Married    Spouse name: N/A  . Number of children: N/A  . Years of education: N/A   Social History Main Topics  . Smoking status: Never Smoker  . Smokeless tobacco: Never Used  . Alcohol use 0.0 oz/week     Comment: occasional/1-2 BEERS PER YEAR  . Drug use: No  . Sexual activity: No   Other Topics Concern  . None   Social History Narrative  . None       ----------------------------------------------------------------------------------------------------------------------  ROS Review of Systems   No changes mentioned GI: No constipation  Objective:  BP 134/77 (BP Location: Left Arm, Patient Position: Sitting, Cuff Size: Large)   Temp 98 F (36.7 C) (Oral)   Resp 16   Ht 5' 7" (1.702 m)   Wt 259 lb (117.5 kg)   SpO2 98%   PF 94 L/min   BMI 40.57 kg/m   Physical Exam Patient is alert oriented cooperative compliant and a good historian Heart is regular rate and rhythm without murmur Lungs show extremity wheezing worse on the right than left Inspection of his low back diminished back spasming based on comparative to baseline..Otherwise no changes on examination are noted with his strength being at baseline. He continues to have pain on standing with left lateral rotation and extension at the low back.   Assessment & Plan:   Tahjay was seen today for back pain.  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 -     Discontinue: Oxycodone HCl 10 MG TABS; Take 1 tablet (10 mg total) by mouth 4 (four) times daily as needed. -     naloxone (NARCAN) nasal spray 4 mg/0.1 mL; For excess sedation from opioids -     Oxycodone HCl 10 MG TABS; Take 1 tablet (10 mg total) by mouth 4 (four) times daily as needed.     ----------------------------------------------------------------------------------------------------------------------  Problem List Items Addressed This Visit      Nervous and Auditory   Myasthenia gravis ([place])    Other Visit Diagnoses    Facet arthritis of lumbosacral region [facility])    -  Primary   Relevant Medications    Oxycodone HCl 10 MG TABS   DDD (degenerative disc disease), lumbar       Relevant Medications   Oxycodone HCl 10 MG TABS   Cervicalgia       Lyme arthritis of multiple joints (HCC)       Relevant Medications   Oxycodone HCl 10 MG TABS      ----------------------------------------------------------------------------------------------------------------------  1.  Facet arthritis of lumbosacral region We will defer on any repeat injections until sometime in the spring of next year.  2. DDD (degenerative disc disease), lumbar We will refill his medications as well today. We'll keep him on his current medications for his low back pain. He's been stable on this regimen with no problems noted.   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 Myasthenia gravis ([place]) Continue follow-up with Dr.  Potter  5. Cervicalgia    ----------------------------------------------------------------------------------------------------------------------  I am having Mr. Haigh start on naloxone. I am also having him maintain his insulin glargine, insulin lispro, pyridostigmine, bisoprolol-hydrochlorothiazide, nitroGLYCERIN, Loratadine (CLARITIN PO), albuterol, augmented betamethasone dipropionate, glucose blood, gabapentin, gabapentin, zolpidem, rosuvastatin, metFORMIN, cyanocobalamin, aspirin, fluticasone-salmeterol, albuterol, pantoprazole, cyclobenzaprine, montelukast, LANTUS SOLOSTAR, azithromycin, predniSONE, bisoprolol-hydrochlorothiazide, amLODipine, and Oxycodone HCl. We will continue to administer lidocaine (PF), midazolam, ropivacaine (PF) 2 mg/mL (0.2%), sodium chloride flush, triamcinolone acetonide, triamcinolone acetonide, sodium chloride flush, ropivacaine (PF) 2 mg/mL (0.2%), lidocaine (PF), triamcinolone acetonide, ropivacaine (PF) 2 mg/mL (0.2%), lidocaine (PF), triamcinolone acetonide, ropivacaine (PF) 2 mg/mL (0.2%), and lidocaine (PF).   Meds ordered this encounter  Medications  .  DISCONTD: Oxycodone HCl 10 MG TABS    Sig: Take 1 tablet (10 mg total) by mouth 4 (four) times daily as needed.    Dispense:  120 tablet    Refill:  0    Do not fill until [id number]  . DISCONTD: Oxycodone HCl 10 MG TABS    Sig: Take 1 tablet (10 mg total) by mouth 4 (four) times daily as needed.    Dispense:  120 tablet    Refill:  0    Do not fill until [id number]  . naloxone (NARCAN) nasal spray 4 mg/0.1 mL    Sig: For excess sedation from opioids    Dispense:  1 kit    Refill:  2  . Oxycodone HCl 10 MG TABS    Sig: Take 1 tablet (10 mg total) by mouth 4 (four) times daily as needed.    Dispense:  120 tablet    Refill:  0    Do not fill until [id number]      Follow-up: Return in about 2 months (around 11/16/2016) for evaluation, med refill.   Molli Barrows, MD

## 2016-10-07 ENCOUNTER — Ambulatory Visit (INDEPENDENT_AMBULATORY_CARE_PROVIDER_SITE_OTHER): Payer: Medicare Other | Admitting: Family Medicine

## 2016-10-07 ENCOUNTER — Encounter: Payer: Self-pay | Admitting: Family Medicine

## 2016-10-07 DIAGNOSIS — J209 Acute bronchitis, unspecified: Secondary | ICD-10-CM | POA: Diagnosis not present

## 2016-10-07 MED ORDER — AZITHROMYCIN 250 MG PO TABS: ORAL_TABLET | ORAL | 0 refills | Status: DC

## 2016-10-07 MED ORDER — PREDNISONE 10 MG PO TABS: 10.0000 mg | ORAL_TABLET | Freq: Every day | ORAL | 0 refills | Status: DC

## 2016-10-07 NOTE — Progress Notes (Signed)
Name: [name]   MRN: [id number]    DOB: September 18, 1961   Date:10/07/2016       Progress Note  Subjective  Chief Complaint  Chief Complaint  Patient presents with  . Nasal Congestion  . Wheezing  . Fatigue    Cough  This is a new problem. The current episode started in the past 7 days (10 days ago). The problem has been unchanged. The cough is productive of sputum. Associated symptoms include chest pain, chills, nasal congestion and wheezing. Pertinent negatives include no fever or sore throat. He has tried OTC cough suppressant (Alka Seltzer and Mucinex) for the symptoms.    Past Medical History:  Diagnosis Date  . Allergy    dust, seasonal (worse in the fall).  . Anginal pain (HCC)    1 OR 2 x IN 3 MONTHS  . 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 Followed by Pain Specialist in [place], back and neck  . Asthma    BRONCHITIS  . Cataract    First Dx in 2012  . Coronary artery disease    Has 10 stents, last placed 2 year ago, Dr Elmer Sow in January  . Diabetes mellitus without complication (HCC)    TYPE 2  . Diabetic peripheral neuropathy (HCC)    feet and hands  . GERD (gastroesophageal reflux disease)   . Headache    muscle tension  . Hyperlipidemia   . Hypertension    CONTROLLED ON MEDS  . Insomnia   . Lyme disease    Chronic  . Myasthenia gravis (HCC)    ? Just eyes or systemic  . Myasthenia gravis (HCC)   . Myocardial infarction 2010  . Seasonal allergies   . Shortness of breath dyspnea    EXERTION  . Sleep apnea    CPAP    Past Surgical History:  Procedure Laterality Date  . BILATERAL CARPAL TUNNEL RELEASE Bilateral L in 2012 and R in 2013  . CARDIAC CATHETERIZATION     Several Caths, most recent in  March 2016.  [name] COLONOSCOPY WITH PROPOFOL N/A 01/10/2016   Procedure: COLONOSCOPY WITH PROPOFOL;  Surgeon: Midge Minium, MD;  Location: [facility] ENDOSCOPY;  Service: Endoscopy;  Laterality: N/A;  . CORONARY ANGIOPLASTY    . ESOPHAGOGASTRODUODENOSCOPY (EGD)  WITH PROPOFOL N/A 01/10/2016   Procedure: ESOPHAGOGASTRODUODENOSCOPY (EGD) WITH PROPOFOL;  Surgeon: Midge Minium, MD;  Location: [facility] END[facility]COPY;  Service: Endoscopy;  Laterality: N/A;  . EYE SURGERY Bilateral 2012   cataract/bilateral vitrectomies  . TONSILLECTOMY AND ADENOIDECTOMY     As a child  . TUNNELED VENOUS CATHETER PLACEMENT     removed    Family History  Problem Relation Age of Onset  . Diabetes Mother   . Heart disease Mother   . Cancer Father     Prostate CA    Social History   Social History  . Marital status: Married    Spouse name: N/A  . Number of children: N/A  . Years of education: N/A   Occupational History  . Not on file.   Social History Main Topics  . Smoking status: Never Smoker  . Smokeless tobacco: Never Used  . Alcohol use 0.0 oz/week     Comment: occasional/1-2 BEERS PER YEAR  . Drug use: No  . Sexual activity: No   Other Topics Concern  . Not on file   Social History Narrative  . No narrative on file     Current Outpatient Prescriptions:  .  albuterol (PROVENTIL HFA;VENTOLIN HFA)  108 (90 Base) MCG/ACT inhaler, Inhale 2 puffs into the lungs every 6 (six) hours as needed for wheezing or shortness of breath., Disp: , Rfl:  .  albuterol (PROVENTIL) (2.5 MG/3ML) 0.083% nebulizer solution, Take 2.5 mg by nebulization every 6 (six) hours as needed for wheezing or shortness of breath., Disp: , Rfl:  .  amLODipine (NORVASC) 5 MG tablet, take 1 tablet by mouth every morning, Disp: 30 tablet, Rfl: 6 .  aspirin 325 MG tablet, Take 325 mg by mouth daily. AM, Disp: , Rfl:  .  augmented betamethasone dipropionate (DIPROLENE-AF) 0.05 % ointment, Apply topically 2 (two) times daily., Disp: 45 g, Rfl: 0 .  bisoprolol-hydrochlorothiazide (ZIAC) 2.5-6.25 MG tablet, Take 1 tablet by mouth daily. am, Disp: , Rfl:  .  bisoprolol-hydrochlorothiazide (ZIAC) 5-6.25 MG tablet, take 1/2 tablet by mouth once daily, Disp: 15 tablet, Rfl: 3 .  cyanocobalamin 1000 MCG  tablet, Take 1,000 mcg by mouth daily. am, Disp: , Rfl:  .  cyclobenzaprine (FLEXERIL) 10 MG tablet, Take 1 tablet (10 mg total) by mouth 3 (three) times daily as needed for muscle spasms., Disp: 90 tablet, Rfl: 5 .  gabapentin (NEURONTIN) 300 MG capsule, Take 1 capsule (300 mg total) by mouth 3 (three) times daily., Disp: 90 capsule, Rfl: 1 .  gabapentin (NEURONTIN) 800 MG tablet, Take 1 tablet (800 mg total) by mouth at bedtime., Disp: 90 tablet, Rfl: 0 .  glucose blood (BAYER CONTOUR TEST) test strip, Use as instructed, Disp: 100 each, Rfl: 2 .  insulin glargine (LANTUS) 100 UNIT/ML injection, Inject 40 Units into the skin at bedtime. , Disp: , Rfl:  .  insulin lispro (HUMALOG KWIKPEN) 100 UNIT/ML KiwkPen, Inject 8 Units into the skin 3 (three) times daily. Reported on 12/02/2015/ sliding scale, Disp: , Rfl:  .  LANTUS SOLOSTAR 100 UNIT/ML Solostar Pen, , Disp: , Rfl: 0 .  Loratadine (CLARITIN PO), Take by mouth daily. am, Disp: , Rfl:  .  metFORMIN (GLUCOPHAGE) 1000 MG tablet, Take 1,000 mg by mouth 2 (two) times daily with a meal., Disp: , Rfl:  .  montelukast (SINGULAIR) 10 MG tablet, every morning. , Disp: , Rfl: 1 .  naloxone (NARCAN) nasal spray 4 mg/0.1 mL, For excess sedation from opioids, Disp: 1 kit, Rfl: 2 .  nitroGLYCERIN (NITROSTAT) 0.3 MG SL tablet, Place 0.3 mg under the tongue every 5 (five) minutes as needed for chest pain. Reported on 12/02/2015, Disp: , Rfl:  .  Oxycodone HCl 10 MG TABS, Take 1 tablet (10 mg total) by mouth 4 (four) times daily as needed., Disp: 120 tablet, Rfl: 0 .  pantoprazole (PROTONIX) 40 MG tablet, Take 1 tablet (40 mg total) by mouth 2 (two) times daily., Disp: 60 tablet, Rfl: 11 .  pyridostigmine (MESTINON) 60 MG tablet, Take 60 mg by mouth 3 (three) times daily. , Disp: , Rfl:  .  rosuvastatin (CRESTOR) 5 MG tablet, Take 2.5 mg by mouth daily at 6 PM. , Disp: , Rfl:  .  zolpidem (AMBIEN) 5 MG tablet, Take 2 tablets (10 mg total) by mouth at bedtime as  needed for sleep. (Patient taking differently: Take 5 mg by mouth at bedtime as needed for sleep. ), Disp: 30 tablet, Rfl: 2 .  azithromycin (ZITHROMAX) 250 MG tablet, 2 tabs po day 1, then 1 tab po q day x 4 days (Patient not taking: Reported on 10/07/2016), Disp: 6 each, Rfl: 0 .  fluticasone-salmeterol (ADVAIR HFA) 230-21 MCG/ACT inhaler, Inhale 1 puff into  the lungs 2 (two) times daily. Reported on 01/09/2016, Disp: , Rfl:  .  predniSONE (DELTASONE) 10 MG tablet, Take 1 tablet (10 mg total) by mouth daily with breakfast. (Patient not taking: Reported on 10/07/2016), Disp: 21 tablet, Rfl: 0  Current Facility-Administered Medications:  .  lidocaine (PF) (XYLOCAINE) 1 % injection 5 mL, 5 mL, Subcutaneous, Once, Molli Barrows, MD .  lidocaine (PF) (XYLOCAINE) 1 % injection 5 mL, 5 mL, Subcutaneous, Once, Molli Barrows, MD .  lidocaine (PF) (XYLOCAINE) 1 % injection 5 mL, 5 mL, Subcutaneous, Once, Molli Barrows, MD .  lidocaine (PF) (XYLOCAINE) 1 % injection 5 mL, 5 mL, Subcutaneous, Once, Molli Barrows, MD .  midazolam (VERSED) injection 5 mg, 5 mg, Intravenous, Once, Molli Barrows, MD .  ropivacaine (PF) 2 mg/ml (0.2%) (NAROPIN) epidural 10 mL, 10 mL, Epidural, Once, Molli Barrows, MD .  ropivacaine (PF) 2 mg/ml (0.2%) (NAROPIN) epidural 10 mL, 10 mL, Epidural, Once, Molli Barrows, MD .  ropivacaine (PF) 2 mg/ml (0.2%) (NAROPIN) epidural 10 mL, 10 mL, Epidural, Once, Molli Barrows, MD .  ropivacaine (PF) 2 mg/ml (0.2%) (NAROPIN) epidural 10 mL, 10 mL, Epidural, Once, Molli Barrows, MD .  sodium chloride flush (NS) 0.9 % injection 10 mL, 10 mL, Other, Once, Molli Barrows, MD .  sodium chloride flush (NS) 0.9 % injection 10 mL, 10 mL, Other, Once, Molli Barrows, MD .  triamcinolone acetonide (KENALOG-40) injection 40 mg, 40 mg, Other, Once, Molli Barrows, MD .  triamcinolone acetonide (KENALOG-40) injection 40 mg, 40 mg, Other, Once, Molli Barrows, MD .  triamcinolone acetonide (KENALOG-40) injection 40  mg, 40 mg, Other, Once, Molli Barrows, MD .  triamcinolone acetonide (KENALOG-40) injection 40 mg, 40 mg, Other, Once, Molli Barrows, MD  Allergies  Allergen Reactions  . Novolog [Insulin Aspart] Hives     Review of Systems  Constitutional: Positive for chills. Negative for fever.  HENT: Positive for congestion and sinus pain. Negative for sore throat.   Respiratory: Positive for cough and wheezing.   Cardiovascular: Positive for chest pain.    Objective  Vitals:   10/07/16 1438  BP: 136/72  Pulse: (!) 102  Resp: 17  Temp: 98.5 F (36.9 C)  TempSrc: Oral  SpO2: 96%  Weight: 248 lb (112.5 kg)  Height: '5\' 7"'$  (1.702 m)    Physical Exam  Constitutional: He is oriented to person, place, and time and well-developed, well-nourished, and in no distress.  HENT:  Head: Normocephalic and atraumatic.  Nose: Right sinus exhibits maxillary sinus tenderness. Left sinus exhibits maxillary sinus tenderness.  Mouth/Throat: No posterior oropharyngeal erythema.  Cardiovascular: Regular rhythm.  Tachycardia present.   Pulmonary/Chest: No respiratory distress. He has wheezes in the right upper field, the right middle field and the left upper field.  Neurological: He is alert and oriented to person, place, and time.  Nursing note and vitals reviewed.      Assessment & Plan  1. Acute bronchitis, unspecified organism  - predniSONE (DELTASONE) 10 MG tablet; Take 1 tablet (10 mg total) by mouth daily with breakfast.  Dispense: 21 tablet; Refill: 0 - azithromycin (ZITHROMAX) 250 MG tablet; 2 tabs po day 1, then 1 tab po q day x 4 days  Dispense: 6 each; Refill: 0   Ryley Teater [facility] 10/07/2016 3:35 PM

## 2016-10-20 ENCOUNTER — Ambulatory Visit: Payer: Medicare Other | Admitting: Family Medicine

## 2016-11-19 ENCOUNTER — Encounter: Payer: Self-pay | Admitting: Anesthesiology

## 2016-11-19 ENCOUNTER — Ambulatory Visit: Payer: Medicare Other | Attending: Anesthesiology | Admitting: Anesthesiology

## 2016-11-19 VITALS — BP 110/89 | HR 107 | Temp 97.8°F | Resp 16 | Ht 67.0 in | Wt 237.0 lb

## 2016-11-19 DIAGNOSIS — M25552 Pain in left hip: Secondary | ICD-10-CM | POA: Insufficient documentation

## 2016-11-19 DIAGNOSIS — M25551 Pain in right hip: Secondary | ICD-10-CM | POA: Diagnosis not present

## 2016-11-19 DIAGNOSIS — M1990 Unspecified osteoarthritis, unspecified site: Secondary | ICD-10-CM | POA: Insufficient documentation

## 2016-11-19 DIAGNOSIS — M4697 Unspecified inflammatory spondylopathy, lumbosacral region: Secondary | ICD-10-CM | POA: Diagnosis not present

## 2016-11-19 DIAGNOSIS — M545 Low back pain: Secondary | ICD-10-CM | POA: Diagnosis not present

## 2016-11-19 DIAGNOSIS — M4687 Other specified inflammatory spondylopathies, lumbosacral region: Secondary | ICD-10-CM | POA: Diagnosis present

## 2016-11-19 DIAGNOSIS — M5136 Other intervertebral disc degeneration, lumbar region: Secondary | ICD-10-CM | POA: Diagnosis not present

## 2016-11-19 DIAGNOSIS — M47817 Spondylosis without myelopathy or radiculopathy, lumbosacral region: Secondary | ICD-10-CM

## 2016-11-19 DIAGNOSIS — A6923 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: Secondary | ICD-10-CM

## 2016-11-19 DIAGNOSIS — M542 Cervicalgia: Secondary | ICD-10-CM | POA: Insufficient documentation

## 2016-11-19 DIAGNOSIS — G7000 Myasthenia gravis without (acute) exacerbation: Secondary | ICD-10-CM | POA: Diagnosis not present

## 2016-11-19 MED ORDER — OXYCODONE HCL 10 MG PO TABS: 10.0000 mg | ORAL_TABLET | Freq: Four times a day (QID) | ORAL | 0 refills | Status: DC | PRN

## 2016-11-19 NOTE — Progress Notes (Signed)
Subjective:  Patient ID: [name], male    DOB: 01-20-1961  Age: 56 y.o. MRN: [id number]  CC: Back Pain (lower bilateral) and Hip Pain Procedure: None   Previous Procedure: October 2017 Medial branch block under fluoroscopic guidance with L5-S1 intra-articular injection left side without sedation  Previous Procedure: Left side L3-4 L4-5 L5-S1 intra-articular facet injection under fluoroscopic guidance with no sedation                                     HPI [name] for reevaluation. He was last seen a few months ago and has some recent exacerbation of his left side lower back pain. In the past he's had facet injections for that which have worked well to keep his pain under good control. He also is taking his oxycodone averaging 4 times a day with good and favorable response yielding to good pain relief. He maintains that the medications enabled him sleep better and function better with an overall lifestyle improvement. We've reviewed the [facility] practitioner database and it is appropriate. He's been compliant with his medication regimen. No changes to lower extremity strength or function are noted.     ---------------------------------------------------------------------------------------------------------------------- Past Medical History:  Diagnosis Date  . Allergy    dust, seasonal (worse in the fall).  . Anginal pain (HCC)    1 OR 2 x IN 3 MONTHS  . 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 Followed by Pain Specialist in [place], back and neck  . Asthma    BRONCHITIS  . Cataract    First Dx in 2012  . Coronary artery disease    Has 10 stents, last placed 2 year ago, Dr Karle Starch in January  . Diabetes mellitus without complication ([place])    TYPE 2  . Diabetic peripheral neuropathy (HCC)    feet and hands  . GERD (gastroesophageal reflux disease)   . Headache    muscle tension  . Hyperlipidemia   . Hypertension    CONTROLLED ON MEDS  . Insomnia   . Lyme  disease    Chronic  . Myasthenia gravis ([place])    ? Just eyes or systemic  . Myasthenia gravis ([place])   . Myocardial infarction 2010  . Seasonal allergies   . Shortness of breath dyspnea    EXERTION  . Sleep apnea    CPAP    Past Surgical History:  Procedure Laterality Date  . BILATERAL CARPAL TUNNEL RELEASE Bilateral L in 2012 and R in 2013  . CARDIAC CATHETERIZATION     Several Caths, most recent in  March 2016.  [name] COLONOSCOPY WITH PROPOFOL N/A 01/10/2016   Procedure: COLONOSCOPY WITH PROPOFOL;  Surgeon: [name], MD;  Location: [facility] ENDOSCOPY;  Service: Endoscopy;  Laterality: N/A;  . CORONARY ANGIOPLASTY    . ESOPHAGOGASTRODUODENOSCOPY (EGD) WITH PROPOFOL N/A 01/10/2016   Procedure: ESOPHAGOGASTRODUODENOSCOPY (EGD) WITH PROPOFOL;  Surgeon: [name], MD;  Location: [facility] ENDOSCOPY;  Service: Endoscopy;  Laterality: N/A;  . EYE SURGERY Bilateral 2012   cataract/bilateral vitrectomies  . TONSILLECTOMY AND ADENOIDECTOMY     As a child  . TUNNELED VENOUS CATHETER PLACEMENT     removed    Family History  Problem Relation Age of Onset  . Diabetes Mother   . Heart disease Mother   . Cancer Father     Prostate CA    Social History  Substance Use Topics  . Smoking  status: Never Smoker  . Smokeless tobacco: Never Used  . Alcohol use 0.0 oz/week     Comment: occasional/1-2 BEERS PER YEAR    ---------------------------------------------------------------------------------------------------------------------- Social History   Social History  . Marital status: Married    Spouse name: N/A  . Number of children: N/A  . Years of education: N/A   Social History Main Topics  . Smoking status: Never Smoker  . Smokeless tobacco: Never Used  . Alcohol use 0.0 oz/week     Comment: occasional/1-2 BEERS PER YEAR  . Drug use: No  . Sexual activity: No   Other Topics Concern  . None   Social History Narrative  . None       ----------------------------------------------------------------------------------------------------------------------  ROS Review of Systems   No changes mentioned GI: No constipation  Objective:  BP 110/89 (BP Location: Left Arm, Patient Position: Sitting, Cuff Size: Large)   Pulse (!) 107   Temp 97.8 F (36.6 C) (Oral)   Resp 16   Ht 5\' 7"  (1.702 m)   Wt 237 lb (107.5 kg)   SpO2 97%   BMI 37.12 kg/m   Physical Exam Patient is alert oriented cooperative compliant and a good historian Heart is regular rate and rhythm without murmur Lungs Mild bilateral externally wheezes noted  Inspection of his low back reveals some pain with extension at the low back standing position and left lateral rotation consistent with a recurrence of the pain that he's had previously. Strength appears to be at baseline.  Assessment & Plan:   [name] was seen today for back pain and hip pain.  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 -     Oxycodone HCl 10 MG TABS; Take 1 tablet (10 mg total) by mouth 4 (four) times daily as needed.     ----------------------------------------------------------------------------------------------------------------------  Problem List Items Addressed This Visit    None    Visit Diagnoses    Facet arthritis of lumbosacral region [facility]    -  Primary   Relevant Medications   Oxycodone HCl 10 MG TABS   DDD (degenerative disc disease), lumbar       Relevant Medications   Oxycodone HCl 10 MG TABS   Cervicalgia       Lyme arthritis of multiple joints (HCC)       Relevant Medications   Oxycodone HCl 10 MG TABS       ----------------------------------------------------------------------------------------------------------------------  1.  Facet arthritis of lumbosacral region We will defer on any repeat injections Today and he has requested a left lumbar facet at the earliest available date.  2. DDD (degenerative disc disease), lumbar We will refill his medications as well today. We'll keep him on his current medications for his low back pain. He's been stable on this regimen with no problems noted.   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 Myasthenia gravis ([place]) Continue follow-up with Dr. Melrose Nakayama  5. Cervicalgia    ----------------------------------------------------------------------------------------------------------------------  I am having Mr. Cueva maintain his insulin glargine, insulin lispro, pyridostigmine, bisoprolol-hydrochlorothiazide, nitroGLYCERIN, Loratadine (CLARITIN PO), albuterol, augmented betamethasone dipropionate, glucose blood, gabapentin, gabapentin, zolpidem, rosuvastatin, metFORMIN, cyanocobalamin, aspirin, fluticasone-salmeterol, albuterol, pantoprazole, cyclobenzaprine, montelukast, LANTUS SOLOSTAR, bisoprolol-hydrochlorothiazide, amLODipine, naloxone, predniSONE, azithromycin, liraglutide, and Oxycodone HCl.  We will continue to administer lidocaine (PF), midazolam, ropivacaine (PF) 2 mg/mL (0.2%), sodium chloride flush, triamcinolone acetonide, triamcinolone acetonide, sodium chloride flush, ropivacaine (PF) 2 mg/mL (0.2%), lidocaine (PF), triamcinolone acetonide, ropivacaine (PF) 2 mg/mL (0.2%), lidocaine (PF), triamcinolone acetonide, ropivacaine (PF) 2 mg/mL (0.2%), and lidocaine (PF).   Meds ordered this encounter  Medications  . liraglutide (VICTOZA) 18 MG/3ML SOPN    Sig: Inject 18 mg into the skin daily.  [name] DISCONTD: Oxycodone HCl 10 MG TABS    Sig: Take 1 tablet (10 mg total) by mouth 4 (four) times daily as needed.    Dispense:  120 tablet     Refill:  0    Do not fill until [id number]  . Oxycodone HCl 10 MG TABS    Sig: Take 1 tablet (10 mg total) by mouth 4 (four) times daily as needed.    Dispense:  120 tablet    Refill:  0    Do not fill until [id number]      Follow-up: Return in about 2 months (around 01/17/2017) for evaluation, procedure.   Molli Barrows, MD

## 2016-11-19 NOTE — Progress Notes (Signed)
Safety precautions to be maintained throughout the outpatient stay will include: orient to surroundings, keep bed in low position, maintain call bell within reach at all times, provide assistance with transfer out of bed and ambulation.  

## 2016-11-25 ENCOUNTER — Other Ambulatory Visit: Payer: Self-pay | Admitting: Cardiovascular Disease

## 2016-12-08 ENCOUNTER — Telehealth: Payer: Self-pay | Admitting: *Deleted

## 2016-12-14 ENCOUNTER — Ambulatory Visit: Payer: Medicare Other | Admitting: Cardiovascular Disease

## 2016-12-24 ENCOUNTER — Encounter: Payer: Self-pay | Admitting: Family Medicine

## 2016-12-24 ENCOUNTER — Ambulatory Visit (INDEPENDENT_AMBULATORY_CARE_PROVIDER_SITE_OTHER): Payer: Medicare Other | Admitting: Family Medicine

## 2016-12-24 DIAGNOSIS — L309 Dermatitis, unspecified: Secondary | ICD-10-CM | POA: Insufficient documentation

## 2016-12-24 MED ORDER — MINOCYCLINE HCL 100 MG PO CAPS: 100.0000 mg | ORAL_CAPSULE | Freq: Two times a day (BID) | ORAL | 0 refills | Status: DC

## 2016-12-24 MED ORDER — TRIAMCINOLONE ACETONIDE 0.1 % EX OINT
1.0000 | TOPICAL_OINTMENT | Freq: Two times a day (BID) | CUTANEOUS | 0 refills | Status: DC
Start: 2016-12-24 — End: 2017-05-10

## 2016-12-24 NOTE — Progress Notes (Signed)
Name: [name]   MRN: [id number]    DOB: May 25, 1961   Date:12/24/2016       Progress Note  Subjective  Chief Complaint  Chief Complaint  Patient presents with  . Rash    Legs, arms, back, and chest    Rash  This is a new problem. The current episode started more than 1 month ago. The problem is unchanged. The affected locations include the left lower leg, chest and back. The rash is characterized by itchiness, burning and scaling. He was exposed to nothing. Past treatments include topical steroids and antibiotic cream (Has tried an antibiotic cream in the past, ).    Past Medical History:  Diagnosis Date  . Allergy    dust, seasonal (worse in the fall).  . Anginal pain (HCC)    1 OR 2 x IN 3 MONTHS  . 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 Followed by Pain Specialist in [place], back and neck  . Asthma    BRONCHITIS  . Cataract    First Dx in 2012  . Coronary artery disease    Has 10 stents, last placed 2 year ago, Dr Karle Starch in January  . Diabetes mellitus without complication ([place])    TYPE 2  . Diabetic peripheral neuropathy (HCC)    feet and hands  . GERD (gastroesophageal reflux disease)   . Headache    muscle tension  . Hyperlipidemia   . Hypertension    CONTROLLED ON MEDS  . Insomnia   . Lyme disease    Chronic  . Myasthenia gravis ([place])    ? Just eyes or systemic  . Myasthenia gravis ([place])   . Myocardial infarction 2010  . Seasonal allergies   . Shortness of breath dyspnea    EXERTION  . Sleep apnea    CPAP    Past Surgical History:  Procedure Laterality Date  . BILATERAL CARPAL TUNNEL RELEASE Bilateral L in 2012 and R in 2013  . CARDIAC CATHETERIZATION     Several Caths, most recent in  March 2016.  [name] COLONOSCOPY WITH PROPOFOL N/A 01/10/2016   Procedure: COLONOSCOPY WITH PROPOFOL;  Surgeon: [name], MD;  Location: [facility] ENDOSCOPY;  Service: Endoscopy;  Laterality: N/A;  . CORONARY ANGIOPLASTY    . ESOPHAGOGASTRODUODENOSCOPY (EGD) WITH  PROPOFOL N/A 01/10/2016   Procedure: ESOPHAGOGASTRODUODENOSCOPY (EGD) WITH PROPOFOL;  Surgeon: [name], MD;  Location: [facility] ENDOSCOPY;  Service: Endoscopy;  Laterality: N/A;  . EYE SURGERY Bilateral 2012   cataract/bilateral vitrectomies  . TONSILLECTOMY AND ADENOIDECTOMY     As a child  . TUNNELED VENOUS CATHETER PLACEMENT     removed    Family History  Problem Relation Age of Onset  . Diabetes Mother   . Heart disease Mother   . Cancer Father     Prostate CA    Social History   Social History  . Marital status: Married    Spouse name: N/A  . Number of children: N/A  . Years of education: N/A   Occupational History  . Not on file.   Social History Main Topics  . Smoking status: Never Smoker  . Smokeless tobacco: Never Used  . Alcohol use 0.0 oz/week     Comment: occasional/1-2 BEERS PER YEAR  . Drug use: No  . Sexual activity: No   Other Topics Concern  . Not on file   Social History Narrative  . No narrative on file     Current Outpatient Prescriptions:  .  albuterol (PROVENTIL  HFA;VENTOLIN HFA) 108 (90 Base) MCG/ACT inhaler, Inhale 2 puffs into the lungs every 6 (six) hours as needed for wheezing or shortness of breath., Disp: , Rfl:  .  albuterol (PROVENTIL) (2.5 MG/3ML) 0.083% nebulizer solution, Take 2.5 mg by nebulization every 6 (six) hours as needed for wheezing or shortness of breath., Disp: , Rfl:  .  amLODipine (NORVASC) 5 MG tablet, take 1 tablet by mouth every morning, Disp: 30 tablet, Rfl: 6 .  aspirin 325 MG tablet, Take 325 mg by mouth daily. AM, Disp: , Rfl:  .  augmented betamethasone dipropionate (DIPROLENE-AF) 0.05 % ointment, Apply topically 2 (two) times daily. (Patient taking differently: Apply topically as needed. ), Disp: 45 g, Rfl: 0 .  bisoprolol-hydrochlorothiazide (ZIAC) 2.5-6.25 MG tablet, Take 1 tablet by mouth daily. am, Disp: , Rfl:  .  bisoprolol-hydrochlorothiazide (ZIAC) 5-6.25 MG tablet, take 1/2 tablet by mouth once daily,  Disp: 15 tablet, Rfl: 3 .  cyanocobalamin 1000 MCG tablet, Take 1,000 mcg by mouth daily. am, Disp: , Rfl:  .  cyclobenzaprine (FLEXERIL) 10 MG tablet, Take 1 tablet (10 mg total) by mouth 3 (three) times daily as needed for muscle spasms., Disp: 90 tablet, Rfl: 5 .  fluticasone-salmeterol (ADVAIR HFA) 230-21 MCG/ACT inhaler, Inhale 1 puff into the lungs 2 (two) times daily. Reported on 01/09/2016, Disp: , Rfl:  .  gabapentin (NEURONTIN) 300 MG capsule, Take 1 capsule (300 mg total) by mouth 3 (three) times daily., Disp: 90 capsule, Rfl: 1 .  gabapentin (NEURONTIN) 800 MG tablet, Take 1 tablet (800 mg total) by mouth at bedtime., Disp: 90 tablet, Rfl: 0 .  glucose blood (BAYER CONTOUR TEST) test strip, Use as instructed, Disp: 100 each, Rfl: 2 .  insulin glargine (LANTUS) 100 UNIT/ML injection, Inject 40 Units into the skin at bedtime. , Disp: , Rfl:  .  insulin lispro (HUMALOG KWIKPEN) 100 UNIT/ML KiwkPen, Inject 8 Units into the skin 3 (three) times daily. Reported on 12/02/2015/ sliding scale, Disp: , Rfl:  .  LANTUS SOLOSTAR 100 UNIT/ML Solostar Pen, , Disp: , Rfl: 0 .  liraglutide (VICTOZA) 18 MG/3ML SOPN, Inject 18 mg into the skin daily., Disp: , Rfl:  .  Loratadine (CLARITIN PO), Take by mouth daily. am, Disp: , Rfl:  .  metFORMIN (GLUCOPHAGE) 1000 MG tablet, Take 1,000 mg by mouth 2 (two) times daily with a meal., Disp: , Rfl:  .  montelukast (SINGULAIR) 10 MG tablet, every morning. , Disp: , Rfl: 1 .  naloxone (NARCAN) nasal spray 4 mg/0.1 mL, For excess sedation from opioids, Disp: 1 kit, Rfl: 2 .  nitroGLYCERIN (NITROSTAT) 0.3 MG SL tablet, Place 0.3 mg under the tongue every 5 (five) minutes as needed for chest pain. Reported on 12/02/2015, Disp: , Rfl:  .  Oxycodone HCl 10 MG TABS, Take 1 tablet (10 mg total) by mouth 4 (four) times daily as needed., Disp: 120 tablet, Rfl: 0 .  pantoprazole (PROTONIX) 40 MG tablet, Take 1 tablet (40 mg total) by mouth 2 (two) times daily., Disp: 60 tablet,  Rfl: 11 .  pyridostigmine (MESTINON) 60 MG tablet, Take 60 mg by mouth 3 (three) times daily. , Disp: , Rfl:  .  rosuvastatin (CRESTOR) 5 MG tablet, Take 2.5 mg by mouth daily at 6 PM. , Disp: , Rfl:   Current Facility-Administered Medications:  .  lidocaine (PF) (XYLOCAINE) 1 % injection 5 mL, 5 mL, Subcutaneous, Once, Molli Barrows, MD .  lidocaine (PF) (XYLOCAINE) 1 % injection 5  mL, 5 mL, Subcutaneous, Once, Molli Barrows, MD .  lidocaine (PF) (XYLOCAINE) 1 % injection 5 mL, 5 mL, Subcutaneous, Once, Molli Barrows, MD .  lidocaine (PF) (XYLOCAINE) 1 % injection 5 mL, 5 mL, Subcutaneous, Once, Molli Barrows, MD .  midazolam (VERSED) injection 5 mg, 5 mg, Intravenous, Once, Molli Barrows, MD .  ropivacaine (PF) 2 mg/ml (0.2%) (NAROPIN) epidural 10 mL, 10 mL, Epidural, Once, Molli Barrows, MD .  ropivacaine (PF) 2 mg/ml (0.2%) (NAROPIN) epidural 10 mL, 10 mL, Epidural, Once, Molli Barrows, MD .  ropivacaine (PF) 2 mg/ml (0.2%) (NAROPIN) epidural 10 mL, 10 mL, Epidural, Once, Molli Barrows, MD .  ropivacaine (PF) 2 mg/ml (0.2%) (NAROPIN) epidural 10 mL, 10 mL, Epidural, Once, Molli Barrows, MD .  sodium chloride flush (NS) 0.9 % injection 10 mL, 10 mL, Other, Once, Molli Barrows, MD .  sodium chloride flush (NS) 0.9 % injection 10 mL, 10 mL, Other, Once, Molli Barrows, MD .  triamcinolone acetonide (KENALOG-40) injection 40 mg, 40 mg, Other, Once, Molli Barrows, MD .  triamcinolone acetonide (KENALOG-40) injection 40 mg, 40 mg, Other, Once, Molli Barrows, MD .  triamcinolone acetonide (KENALOG-40) injection 40 mg, 40 mg, Other, Once, Molli Barrows, MD .  triamcinolone acetonide (KENALOG-40) injection 40 mg, 40 mg, Other, Once, Molli Barrows, MD  Allergies  Allergen Reactions  . Novolog [Insulin Aspart] Hives     Review of Systems  Skin: Positive for rash.    Objective  Vitals:   12/24/16 1418  BP: 115/73  Pulse: (!) 109  Resp: 17  Temp: 98.3 F (36.8 C)  TempSrc: Oral  SpO2:  93%  Weight: 241 lb 8 oz (109.5 kg)  Height: '5\' 7"'$  (1.702 m)    Physical Exam  Constitutional: He is well-developed, well-nourished, and in no distress.  Musculoskeletal:       Legs: Skin:     Macular erythematous pruritic non draining rash on the left lower leg and chest wall  Nursing note and vitals reviewed.    Assessment & Plan  1. Dermatitis Dermatitis plus localized infection due to scratching the pruritic area, start on minocycline and moderate intensity topical steroid - minocycline (MINOCIN,DYNACIN) 100 MG capsule; Take 1 capsule (100 mg total) by mouth 2 (two) times daily.  Dispense: 14 capsule; Refill: 0 - triamcinolone ointment (KENALOG) 0.1 %; Apply 1 application topically 2 (two) times daily.  Dispense: 80 g; Refill: 0   Shaday Rayborn [facility] 12/24/2016 2:37 PM

## 2017-01-03 ENCOUNTER — Other Ambulatory Visit: Payer: Self-pay | Admitting: Gastroenterology

## 2017-01-03 DIAGNOSIS — K219 Gastro-esophageal reflux disease without esophagitis: Secondary | ICD-10-CM

## 2017-01-08 DIAGNOSIS — H02402 Unspecified ptosis of left eyelid: Secondary | ICD-10-CM | POA: Insufficient documentation

## 2017-01-18 ENCOUNTER — Encounter: Payer: Self-pay | Admitting: Anesthesiology

## 2017-01-18 ENCOUNTER — Ambulatory Visit: Payer: Medicare Other | Attending: Anesthesiology | Admitting: Anesthesiology

## 2017-01-18 VITALS — BP 128/84 | HR 103 | Resp 16 | Ht 67.0 in | Wt 237.0 lb

## 2017-01-18 DIAGNOSIS — M5136 Other intervertebral disc degeneration, lumbar region: Secondary | ICD-10-CM | POA: Diagnosis not present

## 2017-01-18 DIAGNOSIS — M4697 Unspecified inflammatory spondylopathy, lumbosacral region: Secondary | ICD-10-CM

## 2017-01-18 DIAGNOSIS — A6923 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: Secondary | ICD-10-CM | POA: Diagnosis not present

## 2017-01-18 DIAGNOSIS — M47817 Spondylosis without myelopathy or radiculopathy, lumbosacral region: Secondary | ICD-10-CM

## 2017-01-18 DIAGNOSIS — M51369 Other intervertebral disc degeneration, lumbar region without mention of lumbar back pain or lower extremity pain: Secondary | ICD-10-CM

## 2017-01-18 DIAGNOSIS — G7000 Myasthenia gravis without (acute) exacerbation: Secondary | ICD-10-CM | POA: Diagnosis not present

## 2017-01-18 DIAGNOSIS — M5431 Sciatica, right side: Secondary | ICD-10-CM

## 2017-01-18 DIAGNOSIS — M47897 Other spondylosis, lumbosacral region: Secondary | ICD-10-CM | POA: Insufficient documentation

## 2017-01-18 MED ORDER — OXYCODONE HCL 10 MG PO TABS: 10.0000 mg | ORAL_TABLET | Freq: Four times a day (QID) | ORAL | 0 refills | Status: DC | PRN

## 2017-01-19 NOTE — Progress Notes (Signed)
Subjective:  Patient ID: [name], male    DOB: 09/30/1961  Age: 56 y.o. MRN: [id number]  CC: Back Pain (lower, left is worse.  bilateral) Procedure: None   Previous Procedure: October 2017 Medial branch block under fluoroscopic guidance with L5-S1 intra-articular injection left side without sedation  Previous Procedure: Left side L3-4 L4-5 L5-S1 intra-articular facet injection under fluoroscopic guidance with no sedation                                     HPI [name] presents for reevaluation today. He was last seen a few months ago and continues to have left-sided lower back pain. Unfortunately she is also getting some right hip pain with some more radicular type pain into the right hip and buttock region. He has responded favorably to facet injections for his left lower back pain in the past and this is scheduled here for the next few weeks. Otherwise he's taking his medications as prescribed with no untoward side effects based on his narcotic assessment sheet which we reviewed today. The quality characteristic distribution of his pain continues to remain stable and he continues to do some stretching strengthening exercises that do seem to help however he has failed conservative therapy traditionally and continues to require opioid management.     ---------------------------------------------------------------------------------------------------------------------- Past Medical History:  Diagnosis Date  . Allergy    dust, seasonal (worse in the fall).  . Anginal pain (HCC)    1 OR 2 x IN 3 MONTHS  . 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 Followed by Pain Specialist in [place], back and neck  . Asthma    BRONCHITIS  . Cataract    First Dx in 2012  . Coronary artery disease    Has 10 stents, last placed 2 year ago, Dr Karle Starch in January  . Diabetes mellitus without complication ([place])    TYPE 2  . Diabetic peripheral neuropathy (HCC)    feet and hands  . GERD  (gastroesophageal reflux disease)   . Headache    muscle tension  . Hyperlipidemia   . Hypertension    CONTROLLED ON MEDS  . Insomnia   . Lyme disease    Chronic  . Myasthenia gravis ([place])    ? Just eyes or systemic  . Myasthenia gravis ([place])   . Myocardial infarction ([place]) 2010  . Seasonal allergies   . Shortness of breath dyspnea    EXERTION  . Sleep apnea    CPAP    Past Surgical History:  Procedure Laterality Date  . BILATERAL CARPAL TUNNEL RELEASE Bilateral L in 2012 and R in 2013  . CARDIAC CATHETERIZATION     Several Caths, most recent in  March 2016.  [name] COLONOSCOPY WITH PROPOFOL N/A 01/10/2016   Procedure: COLONOSCOPY WITH PROPOFOL;  Surgeon: [name], MD;  Location: [facility] ENDOSCOPY;  Service: Endoscopy;  Laterality: N/A;  . CORONARY ANGIOPLASTY    . ESOPHAGOGASTRODUODENOSCOPY (EGD) WITH PROPOFOL N/A 01/10/2016   Procedure: ESOPHAGOGASTRODUODENOSCOPY (EGD) WITH PROPOFOL;  Surgeon: [name], MD;  Location: [facility] ENDOSCOPY;  Service: Endoscopy;  Laterality: N/A;  . EYE SURGERY Bilateral 2012   cataract/bilateral vitrectomies  . TONSILLECTOMY AND ADENOIDECTOMY     As a child  . TUNNELED VENOUS CATHETER PLACEMENT     removed    Family History  Problem Relation Age of Onset  . Diabetes Mother   . Heart disease Mother   .  Cancer Father     Prostate CA    Social History  Substance Use Topics  . Smoking status: Never Smoker  . Smokeless tobacco: Never Used  . Alcohol use 0.0 oz/week     Comment: occasional/1-2 BEERS PER YEAR    ---------------------------------------------------------------------------------------------------------------------- Social History   Social History  . Marital status: Married    Spouse name: N/A  . Number of children: N/A  . Years of education: N/A   Social History Main Topics  . Smoking status: Never Smoker  . Smokeless tobacco: Never Used  . Alcohol use 0.0 oz/week     Comment: occasional/1-2 BEERS PER YEAR  . Drug use:  No  . Sexual activity: No   Other Topics Concern  . None   Social History Narrative  . None      ----------------------------------------------------------------------------------------------------------------------  ROS Review of Systems   No changes mentioned GI: No constipation  Objective:  BP 128/84 (BP Location: Left Arm, Patient Position: Sitting, Cuff Size: Normal)   Pulse (!) 103   Resp 16   Ht 5\' 7"  (1.702 m)   Wt 237 lb (107.5 kg)   SpO2 99%   BMI 37.12 kg/m   Physical Exam Patient is alert oriented cooperative compliant and a good historian Heart is regular rate and rhythm without murmur Inspection of his low back reveals some pain with extension at the low back standing position and left lateral rotation consistent with a recurrence of the pain that he's had previously. Strength appears to be at baseline.  Assessment & Plan:   [name] was seen today for back pain.  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 -     Oxycodone HCl 10 MG TABS; Take 1 tablet (10 mg total) by mouth 4 (four) times daily as needed.     ----------------------------------------------------------------------------------------------------------------------  Problem List Items Addressed This Visit    None    Visit Diagnoses    DDD (degenerative disc disease), lumbar    -  Primary   Relevant Medications   Oxycodone HCl 10 MG TABS   Lyme arthritis of multiple joints (HCC)       Relevant Medications   Oxycodone HCl 10 MG TABS   Other Relevant Orders   ToxASSURE Select 13 (MW), Urine   Facet arthritis of lumbosacral  region ([place])       Relevant Medications   Oxycodone HCl 10 MG TABS   Other Relevant Orders   ToxASSURE Select 13 (MW), Urine   Sciatica of right side          ----------------------------------------------------------------------------------------------------------------------  1.  Facet arthritis of lumbosacral region [place] scheduled for a return visit in approximately 2 weeks for a facet block left side.  2. 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 We've reviewed the [place], practitioner database and it is appropriate.  4. Myasthenia gravis ([place])  Continue follow-up with Dr. Melrose Nakayama  5. Cervicalgia    ----------------------------------------------------------------------------------------------------------------------  I am having Mr. Schipani maintain his insulin glargine, insulin lispro, pyridostigmine, bisoprolol-hydrochlorothiazide, nitroGLYCERIN, Loratadine (CLARITIN PO), albuterol, augmented betamethasone dipropionate, glucose blood, gabapentin, gabapentin, rosuvastatin, metFORMIN, cyanocobalamin, aspirin, fluticasone-salmeterol, albuterol, cyclobenzaprine, montelukast, LANTUS SOLOSTAR, amLODipine, naloxone, liraglutide, bisoprolol-hydrochlorothiazide, minocycline, triamcinolone ointment, pantoprazole, and Oxycodone HCl. We will continue to administer lidocaine (PF), midazolam, ropivacaine (PF) 2 mg/mL (0.2%), sodium chloride flush, triamcinolone acetonide, triamcinolone acetonide, sodium chloride flush, ropivacaine (PF) 2 mg/mL (0.2%), lidocaine (PF), triamcinolone acetonide, ropivacaine (PF) 2 mg/mL (0.2%), lidocaine (PF), triamcinolone acetonide, ropivacaine (PF) 2 mg/mL (0.2%), and lidocaine (PF).   Meds ordered this encounter   Medications  . DISCONTD: Oxycodone HCl 10 MG TABS    Sig: Take 1 tablet (10 mg total) by mouth 4 (four) times daily as needed.    Dispense:  120 tablet    Refill:  0    Do not fill until [id number]  . Oxycodone HCl 10 MG TABS    Sig: Take 1 tablet (10 mg total) by mouth 4 (four) times daily as needed.    Dispense:  120 tablet    Refill:  0    Do not fill until [id number]      Follow-up: Return in about 10 days (around 01/28/2017) for procedure.   Molli Barrows, MD

## 2017-01-21 ENCOUNTER — Ambulatory Visit: Payer: Medicare Other | Admitting: Licensed Clinical Social Worker

## 2017-01-25 ENCOUNTER — Encounter: Payer: Self-pay | Admitting: Cardiovascular Disease

## 2017-01-25 ENCOUNTER — Ambulatory Visit (INDEPENDENT_AMBULATORY_CARE_PROVIDER_SITE_OTHER): Payer: Medicare Other | Admitting: Cardiovascular Disease

## 2017-01-25 VITALS — BP 110/72 | HR 101 | Ht 67.0 in | Wt 240.0 lb

## 2017-01-25 DIAGNOSIS — R0602 Shortness of breath: Secondary | ICD-10-CM | POA: Diagnosis not present

## 2017-01-25 DIAGNOSIS — I10 Essential (primary) hypertension: Secondary | ICD-10-CM | POA: Diagnosis not present

## 2017-01-25 DIAGNOSIS — I25118 Atherosclerotic heart disease of native coronary artery with other forms of angina pectoris: Secondary | ICD-10-CM | POA: Diagnosis not present

## 2017-01-25 DIAGNOSIS — E785 Hyperlipidemia, unspecified: Secondary | ICD-10-CM

## 2017-01-25 LAB — TOXASSURE SELECT 13 (MW), URINE

## 2017-01-25 MED ORDER — NITROGLYCERIN 0.3 MG SL SUBL: 0.3000 mg | SUBLINGUAL_TABLET | SUBLINGUAL | 3 refills | Status: DC | PRN

## 2017-01-25 MED ORDER — METOPROLOL TARTRATE 25 MG PO TABS: 25.0000 mg | ORAL_TABLET | Freq: Two times a day (BID) | ORAL | 1 refills | Status: DC

## 2017-01-25 NOTE — Progress Notes (Signed)
Cardiology Office Note   Date:  01/25/2017   ID:  [name], DOB 03/28/1961, MRN [id number]  PCP:  Keith Rake, MD  Cardiologist:   [name], MD   Chief Complaint  Patient presents with  . other    6 month follow up; overdue. Meds reviewed by the pt. verbally. Pt. c/o no energy, shortness of breath and chest pain.       History of Present Illness: [name] is a 56 y.o. male who presents for a follow-up visit regarding coronary artery disease.  He has extensive cardiac history. He had a total of 10 stents placed (in LAD and RCA) starting in 2009 after he presented with myocardial infarction. Most recent cardiac catheterization according to him was in March 2016 which showed no obstructive disease and he was treated medically. He has other chronic medical conditions that include diabetes, hypertension, hyperlipidemia,myasthenia gravis,  generalized pain due to reported Lyme disease and obesity.  Echocardiogram in January 2017 Showed normal LV systolic function and wall motion with no evidence of pulmonary hypertension. He has history of hyperlipidemia with intolerance to high-dose statins. He has been doing reasonably well but reports worsening exertional dyspnea and fatigue without chest pain. He also had recent episodes of orthostatic dizziness.  Past Medical History:  Diagnosis Date  . Allergy    dust, seasonal (worse in the fall).  . Anginal pain (HCC)    1 OR 2 x IN 3 MONTHS  . 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 Followed by Pain Specialist in [place], back and neck  . Asthma    BRONCHITIS  . Cataract    First Dx in 2012  . Coronary artery disease    Has 10 stents, last placed 2 year ago, Dr Karle Starch in January  . Diabetes mellitus without complication ([place])    TYPE 2  . Diabetic peripheral neuropathy (HCC)    feet and hands  . GERD (gastroesophageal reflux disease)   . Headache    muscle tension  . Hyperlipidemia   . Hypertension    CONTROLLED  ON MEDS  . Insomnia   . Lyme disease    Chronic  . Myasthenia gravis ([place])    ? Just eyes or systemic  . Myasthenia gravis ([place])   . Myocardial infarction ([place]) 2010  . Seasonal allergies   . Shortness of breath dyspnea    EXERTION  . Sleep apnea    CPAP    Past Surgical History:  Procedure Laterality Date  . BILATERAL CARPAL TUNNEL RELEASE Bilateral L in 2012 and R in 2013  . CARDIAC CATHETERIZATION     Several Caths, most recent in  March 2016.  [name] COLONOSCOPY WITH PROPOFOL N/A 01/10/2016   Procedure: COLONOSCOPY WITH PROPOFOL;  Surgeon: [name], MD;  Location: [facility] END[facility]COPY;  Service: Endoscopy;  Laterality: N/A;  . CORONARY ANGIOPLASTY    . ESOPHAGOGASTRODUODENOSCOPY (EGD) WITH PROPOFOL N/A 01/10/2016   Procedure: ESOPHAGOGASTRODUODENOSCOPY (EGD) WITH PROPOFOL;  Surgeon: [name], MD;  Location: [facility] END[facility]COPY;  Service: Endoscopy;  Laterality: N/A;  . EYE SURGERY Bilateral 2012   cataract/bilateral vitrectomies  . TONSILLECTOMY AND ADENOIDECTOMY     As a child  . TUNNELED VENOUS CATHETER PLACEMENT     removed     Current Outpatient Prescriptions  Medication Sig Dispense Refill  . albuterol (PROVENTIL HFA;VENTOLIN HFA) 108 (90 Base) MCG/ACT inhaler Inhale 2 puffs into the lungs every 6 (six) hours as needed for wheezing or shortness of breath.    [name]  albuterol (PROVENTIL) (2.5 MG/3ML) 0.083% nebulizer solution Take 2.5 mg by nebulization every 6 (six) hours as needed for wheezing or shortness of breath.    [name] amLODipine (NORVASC) 5 MG tablet take 1 tablet by mouth every morning 30 tablet 6  . aspirin 325 MG tablet Take 325 mg by mouth daily. AM    . augmented betamethasone dipropionate (DIPROLENE-AF) 0.05 % ointment Apply topically 2 (two) times daily. 45 g 0  . bisoprolol-hydrochlorothiazide (ZIAC) 2.5-6.25 MG tablet Take 1 tablet by mouth daily. am    . bisoprolol-hydrochlorothiazide (ZIAC) 5-6.25 MG tablet take 1/2 tablet by mouth once daily 15 tablet 3  .  cyanocobalamin 1000 MCG tablet Take 1,000 mcg by mouth daily. am    . cyclobenzaprine (FLEXERIL) 10 MG tablet Take 1 tablet (10 mg total) by mouth 3 (three) times daily as needed for muscle spasms. 90 tablet 5  . fluticasone-salmeterol (ADVAIR HFA) 230-21 MCG/ACT inhaler Inhale 1 puff into the lungs 2 (two) times daily. Reported on 01/09/2016    . gabapentin (NEURONTIN) 300 MG capsule Take 1 capsule (300 mg total) by mouth 3 (three) times daily. 90 capsule 1  . gabapentin (NEURONTIN) 800 MG tablet Take 1 tablet (800 mg total) by mouth at bedtime. 90 tablet 0  . glucose blood (BAYER CONTOUR TEST) test strip Use as instructed 100 each 2  . insulin lispro (HUMALOG KWIKPEN) 100 UNIT/ML KiwkPen Inject 8 Units into the skin 3 (three) times daily. Reported on 12/02/2015/ sliding scale    . LANTUS SOLOSTAR 100 UNIT/ML Solostar Pen Inject 42 Units into the skin daily at 10 pm.   0  . liraglutide (VICTOZA) 18 MG/3ML SOPN Inject 18 mg into the skin daily.     . Loratadine (CLARITIN PO) Take by mouth daily. am    . metFORMIN (GLUCOPHAGE) 1000 MG tablet Take 1,000 mg by mouth 2 (two) times daily with a meal.    . montelukast (SINGULAIR) 10 MG tablet every morning.   1  . naloxone (NARCAN) nasal spray 4 mg/0.1 mL For excess sedation from opioids 1 kit 2  . nitroGLYCERIN (NITROSTAT) 0.3 MG SL tablet Place 0.3 mg under the tongue every 5 (five) minutes as needed for chest pain. Reported on 12/02/2015    . Oxycodone HCl 10 MG TABS Take 1 tablet (10 mg total) by mouth 4 (four) times daily as needed. 120 tablet 0  . pantoprazole (PROTONIX) 40 MG tablet Take 1 tablet (40 mg total) by mouth 2 (two) times daily. **PT NEEDS FOLLOW UP APPT** 60 tablet 1  . pyridostigmine (MESTINON) 60 MG tablet Take 60 mg by mouth 4 (four) times daily.     [name] triamcinolone ointment (KENALOG) 0.1 % Apply 1 application topically 2 (two) times daily. 80 g 0  . rosuvastatin (CRESTOR) 5 MG tablet Take 2.5 mg by mouth daily at 6 PM.      Current  Facility-Administered Medications  Medication Dose Route Frequency Provider Last Rate Last Dose  . lidocaine (PF) (XYLOCAINE) 1 % injection 5 mL  5 mL Subcutaneous Once Molli Barrows, MD      . lidocaine (PF) (XYLOCAINE) 1 % injection 5 mL  5 mL Subcutaneous Once Molli Barrows, MD      . lidocaine (PF) (XYLOCAINE) 1 % injection 5 mL  5 mL Subcutaneous Once Molli Barrows, MD      . lidocaine (PF) (XYLOCAINE) 1 % injection 5 mL  5 mL Subcutaneous Once Molli Barrows, MD      .  midazolam (VERSED) injection 5 mg  5 mg Intravenous Once Molli Barrows, MD      . ropivacaine (PF) 2 mg/ml (0.2%) (NAROPIN) epidural 10 mL  10 mL Epidural Once Molli Barrows, MD      . ropivacaine (PF) 2 mg/ml (0.2%) (NAROPIN) epidural 10 mL  10 mL Epidural Once Molli Barrows, MD      . ropivacaine (PF) 2 mg/ml (0.2%) (NAROPIN) epidural 10 mL  10 mL Epidural Once Molli Barrows, MD      . ropivacaine (PF) 2 mg/ml (0.2%) (NAROPIN) epidural 10 mL  10 mL Epidural Once Molli Barrows, MD      . sodium chloride flush (NS) 0.9 % injection 10 mL  10 mL Other Once Molli Barrows, MD      . sodium chloride flush (NS) 0.9 % injection 10 mL  10 mL Other Once Molli Barrows, MD      . triamcinolone acetonide (KENALOG-40) injection 40 mg  40 mg Other Once Molli Barrows, MD      . triamcinolone acetonide (KENALOG-40) injection 40 mg  40 mg Other Once Molli Barrows, MD      . triamcinolone acetonide (KENALOG-40) injection 40 mg  40 mg Other Once Molli Barrows, MD      . triamcinolone acetonide (KENALOG-40) injection 40 mg  40 mg Other Once Molli Barrows, MD        Allergies:   Novolog [insulin aspart]    Social History:  The patient  reports that he has never smoked. He has never used smokeless tobacco. He reports that he drinks alcohol. He reports that he does not use drugs.   Family History:  The patient's family history includes Cancer in his father; Diabetes in his mother; Heart disease in his mother.    ROS:  Please see the history  of present illness.   Otherwise, review of systems are positive for none.   All other systems are reviewed and negative.    PHYSICAL EXAM: VS:  BP 110/72 (BP Location: Left Arm, Patient Position: Sitting, Cuff Size: Normal)   Pulse (!) 101   Ht _0  (1.702 m)   Wt 240 lb (108.9 kg)   BMI 37.59 kg/m  , BMI Body mass index is 37.59 kg/m. GEN: Well nourished, well developed, in no acute distress  HEENT: normal  Neck: no JVD, carotid bruits, or masses Cardiac: RRR; no murmurs, rubs, or gallops,no edema  Respiratory:  clear to auscultation bilaterally, normal work of breathing GI: soft, nontender, nondistended, + BS MS: no deformity or atrophy  Skin: warm and dry, no rash Neuro:  Strength and sensation are intact Psych: euthymic mood, full affect   EKG:  EKG is ordered today. The ekg ordered today demonstrates normal sinus rhythm with anterolateral infarct.   Recent Labs: No results found for requested labs within last 8760 hours.    Lipid Panel No results found for: CHOL, TRIG, HDL, CHOLHDL, VLDL, LDLCALC, LDLDIRECT    Wt Readings from Last 3 Encounters:  01/25/17 240 lb (108.9 kg)  01/18/17 237 lb (107.5 kg)  12/24/16 241 lb 8 oz (109.5 kg)        ASSESSMENT AND PLAN:  1.  Coronary artery disease involving native coronary arteries with other forms of angina:  The patient reports worsening exertional dyspnea and fatigue. I requested a pharmacologic nuclear stress test. He is not able to exercise on a treadmill.  2. Essential hypertension: Blood pressure  is reasonably controlled on current medications. However, he had recent episodes of orthostatic dizziness and heart rate is somewhat on the low side. Thus, I elected to stop bisoprolol/hydrochlorothiazide and start him on metoprolol tartrate 25 mg twice daily.  3. Hyperlipidemia: I reviewed his most recent lipid profile which showed significant improvement on small dose of rosuvastatin.  4. Obesity:  Continue with  healthy lifestyle changes. He has been able to lose some weight since last year.  Disposition:   FU with me in 6 months  Signed,  [name], MD  01/25/2017 2:22 PM    [facility]

## 2017-01-25 NOTE — Patient Instructions (Addendum)
Medication Instructions:  Your physician has recommended you make the following change in your medication:  STOP taking bisoprolol-hydrochlorothiazide START taking metoprolol 25mg  twice daily   Labwork: none  Testing/Procedures: Your physician has requested that you have a lexiscan myoview. For further information please visit [email or web address]. Please follow instruction sheet, as given.  [place]  Your caregiver has ordered a Stress Test with nuclear imaging. The purpose of this test is to evaluate the blood supply to your heart muscle. This procedure is referred to as a "Non-Invasive Stress Test." This is because other than having an IV started in your vein, nothing is inserted or "invades" your body. Cardiac stress tests are done to find areas of poor blood flow to the heart by determining the extent of coronary artery disease (CAD). Some patients exercise on a treadmill, which naturally increases the blood flow to your heart, while others who are  unable to walk on a treadmill due to physical limitations have a pharmacologic/chemical stress agent called Lexiscan . This medicine will mimic walking on a treadmill by temporarily increasing your coronary blood flow.   Please note: these test may take anywhere between 2-4 hours to complete  PLEASE REPORT TO [facility] AT THE FIRST DESK WILL DIRECT YOU WHERE TO GO  Date of Procedure:_____________________________________  Arrival Time for Procedure:______________________________  Instructions regarding medication:   __xx__ : Hold diabetes medication (insulin and metformin) the morning of procedure. Take 1/2 dose of evening insulin the night before  __xx__:  Hold metoprolol night before procedure and morning of procedure  ____:  Hold other medications as  follows:_________________________________________________________________________________________________________________________________________________________________________________________________________________________________________________________________________________________  PLEASE NOTIFY THE OFFICE AT LEAST 24 HOURS IN ADVANCE IF YOU ARE UNABLE TO KEEP YOUR APPOINTMENT.  [phone] AND  PLEASE NOTIFY NUCLEAR MEDICINE AT [facility] AT LEAST 24 HOURS IN ADVANCE IF YOU ARE UNABLE TO KEEP YOUR APPOINTMENT. [phone]  How to prepare for your Myoview test:  1. Do not eat or drink after midnight 2. No caffeine for 24 hours prior to test 3. No smoking 24 hours prior to test. 4. Your medication may be taken with water.  If your doctor stopped a medication because of this test, do not take that medication. 5. Ladies, please do not wear dresses.  Skirts or pants are appropriate. Please wear a short sleeve shirt. 6. No perfume, cologne or lotion. 7. Wear comfortable walking shoes. No heels!            Follow-Up: Your physician wants you to follow-up in: 6 months with Dr. Fletcher Anon.  You will receive a reminder letter in the mail two months in advance. If you don't receive a letter, please call our office to schedule the follow-up appointment.   Any Other Special Instructions Will Be Listed Below (If Applicable).  Please call the office, [phone], when you are ready to schedule your lexiscan.     If you need a refill on your cardiac medications before your next appointment, please call your pharmacy.  Cardiac Nuclear Scan A cardiac nuclear scan is a test that measures blood flow to the heart when a person is resting and when he or she is exercising. The test looks for problems such as:  Not enough blood reaching a portion of the heart.  The heart muscle not working normally. You may need this test if:  You have heart disease.  You have had abnormal lab results.  You  have had heart surgery or angioplasty.  You have chest pain.  You have shortness of breath. In this test, a radioactive dye (tracer) is injected into your bloodstream. After the tracer has traveled to your heart, an imaging device is used to measure how much of the tracer is absorbed by or distributed to various areas of your heart. This procedure is usually done at a hospital and takes 2-4 hours. Tell a health care provider about:  Any allergies you have.  All medicines you are taking, including vitamins, herbs, eye drops, creams, and over-the-counter medicines.  Any problems you or family members have had with the use of anesthetic medicines.  Any blood disorders you have.  Any surgeries you have had.  Any medical conditions you have.  Whether you are pregnant or may be pregnant. What are the risks? Generally, this is a safe procedure. However, problems may occur, including:  Serious chest pain and heart attack. This is only a risk if the stress portion of the test is done.  Rapid heartbeat.  Sensation of warmth in your chest. This usually passes quickly. What happens before the procedure?  Ask your health care provider about changing or stopping your regular medicines. This is especially important if you are taking diabetes medicines or blood thinners.  Remove your jewelry on the day of the procedure. What happens during the procedure?  An IV tube will be inserted into one of your veins.  Your health care provider will inject a small amount of radioactive tracer through the tube.  You will wait for 20-40 minutes while the tracer travels through your bloodstream.  Your heart activity will be monitored with an electrocardiogram (ECG).  You will lie down on an exam table.  Images of your heart will be taken for about 15-20 minutes.  You may be asked to exercise on a treadmill or stationary bike. While you exercise, your heart's activity will be monitored with an ECG,  and your blood pressure will be checked. If you are unable to exercise, you may be given a medicine to increase blood flow to parts of your heart.  When blood flow to your heart has peaked, a tracer will again be injected through the IV tube.  After 20-40 minutes, you will get back on the exam table and have more images taken of your heart.  When the procedure is over, your IV tube will be removed. The procedure may vary among health care providers and hospitals. Depending on the type of tracer used, scans may need to be repeated 3-4 hours later. What happens after the procedure?  Unless your health care provider tells you otherwise, you may return to your normal schedule, including diet, activities, and medicines.  Unless your health care provider tells you otherwise, you may increase your fluid intake. This will help flush the contrast dye from your body. Drink enough fluid to keep your urine clear or pale yellow.  It is up to you to get your test results. Ask your health care provider, or the department that is doing the test, when your results will be ready. Summary  A cardiac nuclear scan measures the blood flow to the heart when a person is resting and when he or she is exercising.  You may need this test if you are at risk for heart disease.  Tell your health care provider if you are pregnant.  Unless your health care provider tells you otherwise, increase your fluid intake. This will help flush the contrast dye from your body. Drink enough fluid to keep your urine  clear or pale yellow. This information is not intended to replace advice given to you by your health care provider. Make sure you discuss any questions you have with your health care provider. Document Released: 10/16/2004 Document Revised: 09/23/2016 Document Reviewed: 08/30/2013 Elsevier Interactive Patient Education  2017 [employer].

## 2017-01-26 ENCOUNTER — Telehealth: Payer: Self-pay | Admitting: Cardiovascular Disease

## 2017-01-26 NOTE — Telephone Encounter (Signed)
Scheduled lexi myoview with patient, May 3, 8am. Reviewed pre-test instructions. Pt agreeable.

## 2017-01-26 NOTE — Telephone Encounter (Signed)
Pt would like to schedule his stress test.

## 2017-01-27 ENCOUNTER — Ambulatory Visit: Payer: Self-pay | Admitting: Family Medicine

## 2017-01-28 ENCOUNTER — Ambulatory Visit: Payer: Medicare Other | Admitting: Anesthesiology

## 2017-02-01 ENCOUNTER — Ambulatory Visit: Payer: Medicare Other | Admitting: Anesthesiology

## 2017-02-04 ENCOUNTER — Encounter: Payer: Self-pay | Admitting: Family Medicine

## 2017-02-04 ENCOUNTER — Encounter: Admission: RE | Admit: 2017-02-04 | Payer: Medicare Other | Source: Ambulatory Visit

## 2017-02-04 ENCOUNTER — Ambulatory Visit (INDEPENDENT_AMBULATORY_CARE_PROVIDER_SITE_OTHER): Payer: Medicare Other | Admitting: Family Medicine

## 2017-02-04 VITALS — BP 130/78 | HR 102 | Temp 97.3°F | Resp 18 | Ht 67.0 in | Wt 241.6 lb

## 2017-02-04 DIAGNOSIS — L700 Acne vulgaris: Secondary | ICD-10-CM

## 2017-02-04 MED ORDER — BENZOYL PEROXIDE 6 % EX LOTN: 1.0000 "application " | TOPICAL_LOTION | Freq: Two times a day (BID) | CUTANEOUS | 0 refills | Status: DC

## 2017-02-04 MED ORDER — ERYTHROMYCIN 2 % EX OINT: 1.0000 "application " | TOPICAL_OINTMENT | Freq: Two times a day (BID) | CUTANEOUS | 0 refills | Status: DC

## 2017-02-04 NOTE — Progress Notes (Signed)
Name: [name]   MRN: [id number]    DOB: 1961-08-09   Date:02/04/2017       Progress Note  Subjective  Chief Complaint  Chief Complaint  Patient presents with  . Follow-up    rash is no better    HPI  Patient presents for evaluation of a recurrent inflammatory and pruritic rash on his chest trunk and upper back. Initially started approximately 3 months ago with raised bumps on his legs. After scratching, they would burst open and spread to his trunk and chest. He was started on topical steroids and minocycline, which seemed to help but the rash returned as soon as he finished taking the antibiotics. Currently, the lesions are located on his trunk, chest and upper back, with intense itching.    Past Medical History:  Diagnosis Date  . Allergy    dust, seasonal (worse in the fall).  . Anginal pain (HCC)    1 OR 2 x IN 3 MONTHS  . 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 Followed by Pain Specialist in [place], back and neck  . Asthma    BRONCHITIS  . Cataract    First Dx in 2012  . Coronary artery disease    Chronic ischemic heart disease with multiple stents placed in the LAD and right coronary artery. He had prior anterior myocardial infarction. All done in [place].  . Diabetes mellitus without complication ([place])    TYPE 2  . Diabetic peripheral neuropathy (HCC)    feet and hands  . GERD (gastroesophageal reflux disease)   . Headache    muscle tension  . Hyperlipidemia   . Hypertension    CONTROLLED ON MEDS  . Insomnia   . Lyme disease    Chronic  . Myasthenia gravis ([place])    ? Just eyes or systemic  . Myasthenia gravis ([place])   . Myocardial infarction ([place]) 2010  . Seasonal allergies   . Shortness of breath dyspnea    EXERTION  . Sleep apnea    CPAP    Past Surgical History:  Procedure Laterality Date  . BILATERAL CARPAL TUNNEL RELEASE Bilateral L in 2012 and R in 2013  . CARDIAC CATHETERIZATION     Several Caths, most recent in  March 2016.  [name] COLONOSCOPY WITH  PROPOFOL N/A 01/10/2016   Procedure: COLONOSCOPY WITH PROPOFOL;  Surgeon: [name], MD;  Location: [facility] ENDOSCOPY;  Service: Endoscopy;  Laterality: N/A;  . CORONARY ANGIOPLASTY    . ESOPHAGOGASTRODUODENOSCOPY (EGD) WITH PROPOFOL N/A 01/10/2016   Procedure: ESOPHAGOGASTRODUODENOSCOPY (EGD) WITH PROPOFOL;  Surgeon: [name], MD;  Location: [facility] ENDOSCOPY;  Service: Endoscopy;  Laterality: N/A;  . EYE SURGERY Bilateral 2012   cataract/bilateral vitrectomies  . TONSILLECTOMY AND ADENOIDECTOMY     As a child  . TUNNELED VENOUS CATHETER PLACEMENT     removed    Family History  Problem Relation Age of Onset  . Diabetes Mother   . Heart disease Mother   . Cancer Father     Prostate CA    Social History   Social History  . Marital status: Married    Spouse name: N/A  . Number of children: N/A  . Years of education: N/A   Occupational History  . Not on file.   Social History Main Topics  . Smoking status: Never Smoker  . Smokeless tobacco: Never Used  . Alcohol use 0.0 oz/week     Comment: occasional/1-2 BEERS PER YEAR  . Drug use: No  . Sexual  activity: No   Other Topics Concern  . Not on file   Social History Narrative  . No narrative on file     Current Outpatient Prescriptions:  .  albuterol (PROVENTIL HFA;VENTOLIN HFA) 108 (90 Base) MCG/ACT inhaler, Inhale 2 puffs into the lungs every 6 (six) hours as needed for wheezing or shortness of breath., Disp: , Rfl:  .  albuterol (PROVENTIL) (2.5 MG/3ML) 0.083% nebulizer solution, Take 2.5 mg by nebulization every 6 (six) hours as needed for wheezing or shortness of breath., Disp: , Rfl:  .  amLODipine (NORVASC) 5 MG tablet, take 1 tablet by mouth every morning, Disp: 30 tablet, Rfl: 6 .  aspirin 325 MG tablet, Take 325 mg by mouth daily. AM, Disp: , Rfl:  .  augmented betamethasone dipropionate (DIPROLENE-AF) 0.05 % ointment, Apply topically 2 (two) times daily., Disp: 45 g, Rfl: 0 .  cyanocobalamin 1000 MCG tablet, Take  1,000 mcg by mouth daily. am, Disp: , Rfl:  .  cyclobenzaprine (FLEXERIL) 10 MG tablet, Take 1 tablet (10 mg total) by mouth 3 (three) times daily as needed for muscle spasms., Disp: 90 tablet, Rfl: 5 .  fluticasone-salmeterol (ADVAIR HFA) 230-21 MCG/ACT inhaler, Inhale 1 puff into the lungs 2 (two) times daily. Reported on 01/09/2016, Disp: , Rfl:  .  gabapentin (NEURONTIN) 300 MG capsule, Take 1 capsule (300 mg total) by mouth 3 (three) times daily., Disp: 90 capsule, Rfl: 1 .  gabapentin (NEURONTIN) 800 MG tablet, Take 1 tablet (800 mg total) by mouth at bedtime., Disp: 90 tablet, Rfl: 0 .  glucose blood (BAYER CONTOUR TEST) test strip, Use as instructed, Disp: 100 each, Rfl: 2 .  insulin lispro (HUMALOG KWIKPEN) 100 UNIT/ML KiwkPen, Inject 8 Units into the skin 3 (three) times daily. Reported on 12/02/2015/ sliding scale, Disp: , Rfl:  .  LANTUS SOLOSTAR 100 UNIT/ML Solostar Pen, Inject 42 Units into the skin daily at 10 pm. , Disp: , Rfl: 0 .  liraglutide (VICTOZA) 18 MG/3ML SOPN, Inject 18 mg into the skin daily. , Disp: , Rfl:  .  Loratadine (CLARITIN PO), Take by mouth daily. am, Disp: , Rfl:  .  metFORMIN (GLUCOPHAGE) 1000 MG tablet, Take 1,000 mg by mouth 2 (two) times daily with a meal., Disp: , Rfl:  .  metoprolol tartrate (LOPRESSOR) 25 MG tablet, Take 1 tablet (25 mg total) by mouth 2 (two) times daily., Disp: 180 tablet, Rfl: 1 .  montelukast (SINGULAIR) 10 MG tablet, every morning. , Disp: , Rfl: 1 .  naloxone (NARCAN) nasal spray 4 mg/0.1 mL, For excess sedation from opioids, Disp: 1 kit, Rfl: 2 .  nitroGLYCERIN (NITROSTAT) 0.3 MG SL tablet, Place 1 tablet (0.3 mg total) under the tongue every 5 (five) minutes as needed for chest pain. Reported on 12/02/2015, Disp: 25 tablet, Rfl: 3 .  Oxycodone HCl 10 MG TABS, Take 1 tablet (10 mg total) by mouth 4 (four) times daily as needed., Disp: 120 tablet, Rfl: 0 .  pantoprazole (PROTONIX) 40 MG tablet, Take 1 tablet (40 mg total) by mouth 2  (two) times daily. **PT NEEDS FOLLOW UP APPT**, Disp: 60 tablet, Rfl: 1 .  pyridostigmine (MESTINON) 60 MG tablet, Take 60 mg by mouth 4 (four) times daily. , Disp: , Rfl:  .  triamcinolone ointment (KENALOG) 0.1 %, Apply 1 application topically 2 (two) times daily., Disp: 80 g, Rfl: 0 .  rosuvastatin (CRESTOR) 5 MG tablet, Take 2.5 mg by mouth daily at 6 PM. , Disp: ,  Rfl:   Current Facility-Administered Medications:  .  lidocaine (PF) (XYLOCAINE) 1 % injection 5 mL, 5 mL, Subcutaneous, Once, Molli Barrows, MD .  lidocaine (PF) (XYLOCAINE) 1 % injection 5 mL, 5 mL, Subcutaneous, Once, Molli Barrows, MD .  lidocaine (PF) (XYLOCAINE) 1 % injection 5 mL, 5 mL, Subcutaneous, Once, Molli Barrows, MD .  lidocaine (PF) (XYLOCAINE) 1 % injection 5 mL, 5 mL, Subcutaneous, Once, Molli Barrows, MD .  midazolam (VERSED) injection 5 mg, 5 mg, Intravenous, Once, Molli Barrows, MD .  ropivacaine (PF) 2 mg/ml (0.2%) (NAROPIN) epidural 10 mL, 10 mL, Epidural, Once, Molli Barrows, MD .  ropivacaine (PF) 2 mg/ml (0.2%) (NAROPIN) epidural 10 mL, 10 mL, Epidural, Once, Molli Barrows, MD .  ropivacaine (PF) 2 mg/ml (0.2%) (NAROPIN) epidural 10 mL, 10 mL, Epidural, Once, Molli Barrows, MD .  ropivacaine (PF) 2 mg/ml (0.2%) (NAROPIN) epidural 10 mL, 10 mL, Epidural, Once, Molli Barrows, MD .  sodium chloride flush (NS) 0.9 % injection 10 mL, 10 mL, Other, Once, Molli Barrows, MD .  sodium chloride flush (NS) 0.9 % injection 10 mL, 10 mL, Other, Once, Molli Barrows, MD .  triamcinolone acetonide (KENALOG-40) injection 40 mg, 40 mg, Other, Once, Molli Barrows, MD .  triamcinolone acetonide (KENALOG-40) injection 40 mg, 40 mg, Other, Once, Molli Barrows, MD .  triamcinolone acetonide (KENALOG-40) injection 40 mg, 40 mg, Other, Once, Molli Barrows, MD .  triamcinolone acetonide (KENALOG-40) injection 40 mg, 40 mg, Other, Once, Molli Barrows, MD  Allergies  Allergen Reactions  . Novolog [Insulin Aspart] Hives      Review of Systems  Constitutional: Positive for malaise/fatigue and weight loss. Negative for chills and fever.  Skin: Positive for itching and rash.    Objective  Vitals:   02/04/17 1509  BP: 130/78  Pulse: (!) 102  Resp: 18  Temp: 97.3 F (36.3 C)  TempSrc: Oral  SpO2: 95%  Weight: 241 lb 9.6 oz (109.6 kg)  Height: _0  (1.702 m)    Physical Exam  Constitutional: He is oriented to person, place, and time and well-developed, well-nourished, and in no distress.  HENT:  Head: Normocephalic and atraumatic.  Neurological: He is alert and oriented to person, place, and time.  Skin: Lesion noted.  Scattered pruritic lesions on the trunk, chest and upper back, some lesions have ulcerated, others with dome shaped inflammatory nodules.   Psychiatric: Mood, memory, affect and judgment normal.  Nursing note and vitals reviewed.      Assessment & Plan  1. Acne vulgaris This is likely acne based on symptoms and presentation, started on topical erythromycin and benzoyl peroxide - Erythromycin 2 % ointment; Apply 1 application topically 2 (two) times daily.  Dispense: 25 g; Refill: 0 - Benzoyl Peroxide 6 % LOTN; Apply 1 application topically 2 (two) times daily.  Dispense: 340.2 g; Refill: 0   Destynie Toomey [facility] 02/04/2017 3:19 PM

## 2017-02-05 ENCOUNTER — Encounter
Admission: RE | Admit: 2017-02-05 | Discharge: 2017-02-05 | Disposition: A | Discharge status: Home / Self Care | Payer: Medicare Other | Source: Ambulatory Visit | Attending: Cardiovascular Disease | Admitting: Cardiovascular Disease

## 2017-02-05 DIAGNOSIS — R0602 Shortness of breath: Secondary | ICD-10-CM | POA: Diagnosis not present

## 2017-02-05 DIAGNOSIS — I519 Heart disease, unspecified: Secondary | ICD-10-CM | POA: Insufficient documentation

## 2017-02-05 LAB — NM MYOCAR MULTI W/SPECT W/WALL MOTION / EF
CHL CUP NUCLEAR SRS: 19
CSEPED: 0 min
CSEPEDS: 0 s
CSEPEW: 1 METS
CSEPHR: 69 %
LV sys vol: 43 mL
LVDIAVOL: 102 mL (ref 62–150)
MPHR: 163 {beats}/min
Peak HR: 113 {beats}/min
Rest HR: 106 {beats}/min
SDS: 3
SSS: 15

## 2017-02-05 MED ORDER — TECHNETIUM TC 99M TETROFOSMIN IV KIT
30.6800 | PACK | Freq: Once | INTRAVENOUS | Status: AC | PRN
  Administered 2017-02-05: 30.68 via INTRAVENOUS

## 2017-02-05 MED ORDER — REGADENOSON 0.4 MG/5ML IV SOLN
0.4000 mg | Freq: Once | INTRAVENOUS | Status: AC
  Administered 2017-02-05: 0.4 mg via INTRAVENOUS

## 2017-02-05 MED ORDER — TECHNETIUM TC 99M TETROFOSMIN IV KIT
12.5700 | PACK | Freq: Once | INTRAVENOUS | Status: AC | PRN
  Administered 2017-02-05: 12.57 via INTRAVENOUS

## 2017-02-12 ENCOUNTER — Telehealth: Payer: Self-pay | Admitting: Family Medicine

## 2017-02-12 NOTE — Telephone Encounter (Signed)
PT S NEEDING REFILL ON THE MEDICATION THAT YOU GAVE HIM FOR THE RASH. AND HE WANTS TO KNOW IF HE NEEDS A REFERRAL TO DERMATOLOGY.

## 2017-02-16 ENCOUNTER — Telehealth: Payer: Self-pay | Admitting: Cardiovascular Disease

## 2017-02-16 NOTE — Telephone Encounter (Signed)
Pt calling stating last month when he was seen  We placed him on Metoprolol  He was taking Bisoprolol Over the last month he went to Endo doctor And his potassium is running a bit high and he's had a rash on his stomach Would like to know about switching back  Please  Advise

## 2017-02-16 NOTE — Telephone Encounter (Signed)
Attempted to contact pt. No answer, no VM. Will call again.

## 2017-02-17 ENCOUNTER — Other Ambulatory Visit: Payer: Self-pay

## 2017-02-17 MED ORDER — BISOPROLOL-HYDROCHLOROTHIAZIDE 2.5-6.25 MG PO TABS: 1.0000 | ORAL_TABLET | Freq: Every day | ORAL | 5 refills | Status: DC

## 2017-02-17 NOTE — Telephone Encounter (Signed)
Left message on machine for patient to contact the office.   

## 2017-02-17 NOTE — Telephone Encounter (Signed)
Patient returning call.

## 2017-02-17 NOTE — Telephone Encounter (Signed)
S/w pt who reports rash which began a few days after starting metoprolol. Metoprolol 25mg  BID was added when bisoprolol/HCTZ was d/c'd at 4/23 OV. Rash is "everywhere", primarily upper body and in the groin area. It itches and is red with the worst of it on his chest. Some areas are warm to the touch. Abx cream and benzoyl peroxide lotion prescribed by PCP  but it did not help He went to the dermatologist yesterday and was instructed to stop lotion and abx cream and start a steroid cream. He has seen improvement in the rash but is concerned it is r/t metoprolol.  He also mentions elevated potassium at endocrinologist appt last week. He was instructed to limit dietary intake of potassium rich foods.  Results in Care Everywhere: 5/4 K+ 5.4 5/9 K+ 5.5 Advised him to follow recommendations of  Endocrinologist and that metoprolol is usually not a contributing factor for hyperkalemia.  Will route to Dr. Fletcher Anon for further review.

## 2017-02-17 NOTE — Telephone Encounter (Signed)
Stop metoprolol. Place him back on bisoprolol-hydrochlorothiazide (ZIAC) 2.5-6.25 MG tablet once daily. The hydrochlorothiazide might help bring his potassium down.

## 2017-02-17 NOTE — Telephone Encounter (Signed)
Reviewed MD recommendations w/pt who verbalized understanding and is agreeable. He requests a new ziac prescription to be submitted to [facility]. Medication list updated; prescription sent.

## 2017-03-03 ENCOUNTER — Ambulatory Visit: Payer: Medicare Other | Attending: Anesthesiology | Admitting: Anesthesiology

## 2017-03-03 ENCOUNTER — Encounter: Payer: Self-pay | Admitting: Anesthesiology

## 2017-03-03 VITALS — BP 115/82 | HR 102 | Temp 98.1°F | Resp 18 | Ht 67.0 in | Wt 238.0 lb

## 2017-03-03 DIAGNOSIS — G4730 Sleep apnea, unspecified: Secondary | ICD-10-CM | POA: Diagnosis not present

## 2017-03-03 DIAGNOSIS — G7000 Myasthenia gravis without (acute) exacerbation: Secondary | ICD-10-CM | POA: Insufficient documentation

## 2017-03-03 DIAGNOSIS — G894 Chronic pain syndrome: Secondary | ICD-10-CM | POA: Insufficient documentation

## 2017-03-03 DIAGNOSIS — K219 Gastro-esophageal reflux disease without esophagitis: Secondary | ICD-10-CM | POA: Diagnosis not present

## 2017-03-03 DIAGNOSIS — E1142 Type 2 diabetes mellitus with diabetic polyneuropathy: Secondary | ICD-10-CM | POA: Diagnosis not present

## 2017-03-03 DIAGNOSIS — M549 Dorsalgia, unspecified: Secondary | ICD-10-CM | POA: Diagnosis not present

## 2017-03-03 DIAGNOSIS — I252 Old myocardial infarction: Secondary | ICD-10-CM | POA: Insufficient documentation

## 2017-03-03 DIAGNOSIS — M5136 Other intervertebral disc degeneration, lumbar region: Secondary | ICD-10-CM | POA: Diagnosis not present

## 2017-03-03 DIAGNOSIS — M4697 Unspecified inflammatory spondylopathy, lumbosacral region: Secondary | ICD-10-CM

## 2017-03-03 DIAGNOSIS — Z8249 Family history of ischemic heart disease and other diseases of the circulatory system: Secondary | ICD-10-CM | POA: Diagnosis not present

## 2017-03-03 DIAGNOSIS — I2510 Atherosclerotic heart disease of native coronary artery without angina pectoris: Secondary | ICD-10-CM | POA: Insufficient documentation

## 2017-03-03 DIAGNOSIS — Z833 Family history of diabetes mellitus: Secondary | ICD-10-CM | POA: Insufficient documentation

## 2017-03-03 DIAGNOSIS — Z9889 Other specified postprocedural states: Secondary | ICD-10-CM | POA: Insufficient documentation

## 2017-03-03 DIAGNOSIS — M47817 Spondylosis without myelopathy or radiculopathy, lumbosacral region: Secondary | ICD-10-CM

## 2017-03-03 DIAGNOSIS — I10 Essential (primary) hypertension: Secondary | ICD-10-CM | POA: Diagnosis not present

## 2017-03-03 DIAGNOSIS — E785 Hyperlipidemia, unspecified: Secondary | ICD-10-CM | POA: Insufficient documentation

## 2017-03-03 DIAGNOSIS — M542 Cervicalgia: Secondary | ICD-10-CM

## 2017-03-03 DIAGNOSIS — J45909 Unspecified asthma, uncomplicated: Secondary | ICD-10-CM | POA: Diagnosis not present

## 2017-03-03 DIAGNOSIS — Z8042 Family history of malignant neoplasm of prostate: Secondary | ICD-10-CM | POA: Diagnosis not present

## 2017-03-03 DIAGNOSIS — G4700 Insomnia, unspecified: Secondary | ICD-10-CM | POA: Insufficient documentation

## 2017-03-03 MED ORDER — OXYCODONE HCL 10 MG PO TABS: 10.0000 mg | ORAL_TABLET | Freq: Four times a day (QID) | ORAL | 0 refills | Status: DC | PRN

## 2017-03-03 NOTE — Progress Notes (Signed)
Subjective:  Patient ID: [name], male    DOB: 10/05/1961  Age: 56 y.o. MRN: [id number]  CC: Back Pain (lower) Procedure: None   Previous Procedure: October 2017 Medial branch block under fluoroscopic guidance with L5-S1 intra-articular injection left side without sedation  Previous Procedure: Left side L3-4 L4-5 L5-S1 intra-articular facet injection under fluoroscopic guidance with no sedation                                     HPI [name] presentFor reevaluation today. He was last seen approximately a month ago and continues to have left-sided low back pain and diffuse body pain. This is subsequent to his Lyme disease and MS and chronic ongoing left side facet pain. He's taking his medications as prescribed with no difficulties mentioned today. He has been compliant with his regimen. The quality characteristic and restoration of his left lower back pain a been stable in nature and have previously responded to facet injections. He desires to proceed with a repeat injection however was unable to pursue this today and is scheduled for return to clinic in early July for his injection.    ---------------------------------------------------------------------------------------------------------------------- Past Medical History:  Diagnosis Date  . Allergy    dust, seasonal (worse in the fall).  . Anginal pain (HCC)    1 OR 2 x IN 3 MONTHS  . 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 Followed by Pain Specialist in [place], back and neck  . Asthma    BRONCHITIS  . Cataract    First Dx in 2012  . Coronary artery disease    Chronic ischemic heart disease with multiple stents placed in the LAD and right coronary artery. He had prior anterior myocardial infarction. All done in [place].  . Diabetes mellitus without complication ([place])    TYPE 2  . Diabetic peripheral neuropathy (HCC)    feet and hands  . GERD (gastroesophageal reflux disease)   . Headache    muscle tension  . Hyperlipidemia    . Hypertension    CONTROLLED ON MEDS  . Insomnia   . Lyme disease    Chronic  . Myasthenia gravis ([place])    ? Just eyes or systemic  . Myasthenia gravis ([place])   . Myocardial infarction ([place]) 2010  . Seasonal allergies   . Shortness of breath dyspnea    EXERTION  . Sleep apnea    CPAP    Past Surgical History:  Procedure Laterality Date  . BILATERAL CARPAL TUNNEL RELEASE Bilateral L in 2012 and R in 2013  . CARDIAC CATHETERIZATION     Several Caths, most recent in  March 2016.  [name] COLONOSCOPY WITH PROPOFOL N/A 01/10/2016   Procedure: COLONOSCOPY WITH PROPOFOL;  Surgeon: [name], MD;  Location: [facility] END[facility]COPY;  Service: Endoscopy;  Laterality: N/A;  . CORONARY ANGIOPLASTY    . ESOPHAGOGASTRODUODENOSCOPY (EGD) WITH PROPOFOL N/A 01/10/2016   Procedure: ESOPHAGOGASTRODUODENOSCOPY (EGD) WITH PROPOFOL;  Surgeon: [name], MD;  Location: [facility] END[facility]COPY;  Service: Endoscopy;  Laterality: N/A;  . EYE SURGERY Bilateral 2012   cataract/bilateral vitrectomies  . TONSILLECTOMY AND ADENOIDECTOMY     As a child  . TUNNELED VENOUS CATHETER PLACEMENT     removed    Family History  Problem Relation Age of Onset  . Diabetes Mother   . Heart disease Mother   . Cancer Father        Prostate CA  Social History  Substance Use Topics  . Smoking status: Never Smoker  . Smokeless tobacco: Never Used  . Alcohol use 0.0 oz/week     Comment: occasional/1-2 BEERS PER YEAR    ---------------------------------------------------------------------------------------------------------------------- Social History   Social History  . Marital status: Married    Spouse name: N/A  . Number of children: N/A  . Years of education: N/A   Social History Main Topics  . Smoking status: Never Smoker  . Smokeless tobacco: Never Used  . Alcohol use 0.0 oz/week     Comment: occasional/1-2 BEERS PER YEAR  . Drug use: No  . Sexual activity: No   Other Topics Concern  . None   Social History  Narrative  . None      ----------------------------------------------------------------------------------------------------------------------  ROS Review of Systems   No changes mentioned GI: No constipationOtherwise no changes  Objective:  BP 115/82   Pulse (!) 102   Temp 98.1 F (36.7 C) (Oral)   Resp 18   Ht 5\' 7"  (1.702 m)   Wt 238 lb (108 kg)   SpO2 99%   BMI 37.28 kg/m   Physical Exam Patient is alert oriented cooperative compliant and a good historian Heart is regular rate and rhythm without murmur His exam remained stable with pain on extension and left lateral rotation greater than right lateral rotation. He has difficulty ambulating and uses a cane for stability.  Assessment & Plan:   [name] was seen today for back pain.  Diagnoses and all orders for this visit:  DDD (degenerative disc disease), lumbar  Facet arthritis of lumbosacral region (HCC)  Cervicalgia  Chronic pain syndrome  Other orders -     Discontinue: Oxycodone HCl 10 MG TABS; Take 1 tablet (10 mg total) by mouth 4 (four) times daily as needed. -     Oxycodone HCl 10 MG TABS; Take 1 tablet (10 mg total) by mouth 4 (four) times daily as needed.     ----------------------------------------------------------------------------------------------------------------------  Problem List Items Addressed This Visit    None    Visit Diagnoses    DDD (degenerative disc disease), lumbar    -  Primary   Relevant Medications   Oxycodone HCl 10 MG TABS   Facet arthritis of lumbosacral region (HCC)       Relevant Medications   Oxycodone HCl 10 MG TABS   Cervicalgia       Chronic pain syndrome          ----------------------------------------------------------------------------------------------------------------------  1.  Facet arthritis of lumbosacral region He is scheduled for a return visit in approximately 1 month for a facet block left side.  2. DDD (degenerative disc disease),  lumbar Ultimately he may require an epidural steroid didn't help with the L5-S1 radicular symptoms he's getting in his right hip and buttocks   3 Lyme arthritis of multiple joints ([place]) We'll continue his current opioid therapy with refill given today. Once again we have reviewed the [facility] practitioner database information and it is appropriate.  4. Myasthenia gravis ([place]) Continue follow-up with Dr. Melrose Nakayama  5. Cervicalgia    ----------------------------------------------------------------------------------------------------------------------  I am having Mr. Plantz maintain his insulin lispro, pyridostigmine, Loratadine (CLARITIN PO), albuterol, augmented betamethasone dipropionate, glucose blood, gabapentin, gabapentin, rosuvastatin, metFORMIN, cyanocobalamin, aspirin, fluticasone-salmeterol, albuterol, cyclobenzaprine, montelukast, LANTUS SOLOSTAR, amLODipine, naloxone, liraglutide, triamcinolone ointment, pantoprazole, nitroGLYCERIN, Erythromycin, Benzoyl Peroxide, bisoprolol-hydrochlorothiazide, and Oxycodone HCl. We will continue to administer lidocaine (PF), midazolam, ropivacaine (PF) 2 mg/mL (0.2%), sodium chloride flush, triamcinolone acetonide, triamcinolone acetonide, sodium chloride flush, ropivacaine (  PF) 2 mg/mL (0.2%), lidocaine (PF), triamcinolone acetonide, ropivacaine (PF) 2 mg/mL (0.2%), lidocaine (PF), triamcinolone acetonide, ropivacaine (PF) 2 mg/mL (0.2%), and lidocaine (PF).   Meds ordered this encounter  Medications  . DISCONTD: Oxycodone HCl 10 MG TABS    Sig: Take 1 tablet (10 mg total) by mouth 4 (four) times daily as needed.    Dispense:  120 tablet    Refill:  0    Do not fill until [id number]  . Oxycodone HCl 10 MG TABS    Sig: Take 1 tablet (10 mg total) by mouth 4 (four) times daily as needed.    Dispense:  120 tablet    Refill:  0    Do not fill until [id number]      Follow-up: Return in about 1 month (around 04/03/2017) for procedure.    Molli Barrows, MD

## 2017-03-03 NOTE — Progress Notes (Signed)
Nursing Pain Medication Assessment:  Safety precautions to be maintained throughout the outpatient stay will include: orient to surroundings, keep bed in low position, maintain call bell within reach at all times, provide assistance with transfer out of bed and ambulation.  Medication Inspection Compliance: Pill count conducted under aseptic conditions, in front of the patient. Neither the pills nor the bottle was removed from the patient's sight at any time. Once count was completed pills were immediately returned to the patient in their original bottle.  Medication: Oxycodone IR Pill/Patch Count: 101 of 120 pills remain Pill/Patch Appearance: Markings consistent with prescribed medication Bottle Appearance: Standard pharmacy container. Clearly labeled. Filled Date:2018 Last Medication intake:  Today

## 2017-03-03 NOTE — Patient Instructions (Signed)
You were given 2 prescriptions for Oxycodone today. 

## 2017-03-05 ENCOUNTER — Other Ambulatory Visit: Payer: Self-pay | Admitting: Gastroenterology

## 2017-03-05 DIAGNOSIS — K219 Gastro-esophageal reflux disease without esophagitis: Secondary | ICD-10-CM

## 2017-03-08 ENCOUNTER — Other Ambulatory Visit: Payer: Self-pay

## 2017-03-08 DIAGNOSIS — K219 Gastro-esophageal reflux disease without esophagitis: Secondary | ICD-10-CM

## 2017-03-08 MED ORDER — PANTOPRAZOLE SODIUM 40 MG PO TBEC: 40.0000 mg | DELAYED_RELEASE_TABLET | Freq: Two times a day (BID) | ORAL | 1 refills | Status: DC

## 2017-03-14 ENCOUNTER — Other Ambulatory Visit: Payer: Self-pay | Admitting: Cardiovascular Disease

## 2017-03-14 DIAGNOSIS — I10 Essential (primary) hypertension: Secondary | ICD-10-CM

## 2017-03-22 ENCOUNTER — Encounter: Payer: Self-pay | Admitting: Family Medicine

## 2017-03-22 ENCOUNTER — Ambulatory Visit (INDEPENDENT_AMBULATORY_CARE_PROVIDER_SITE_OTHER): Payer: Medicare Other | Admitting: Family Medicine

## 2017-03-22 VITALS — BP 117/74 | HR 116 | Temp 97.8°F | Resp 17 | Ht 67.0 in | Wt 234.4 lb

## 2017-03-22 DIAGNOSIS — R05 Cough: Secondary | ICD-10-CM

## 2017-03-22 DIAGNOSIS — R0989 Other specified symptoms and signs involving the circulatory and respiratory systems: Secondary | ICD-10-CM | POA: Diagnosis not present

## 2017-03-22 DIAGNOSIS — R059 Cough, unspecified: Secondary | ICD-10-CM

## 2017-03-22 MED ORDER — AZITHROMYCIN 250 MG PO TABS: ORAL_TABLET | ORAL | 0 refills | Status: DC

## 2017-03-22 MED ORDER — BENZONATATE 100 MG PO CAPS: 100.0000 mg | ORAL_CAPSULE | Freq: Three times a day (TID) | ORAL | 0 refills | Status: DC | PRN

## 2017-03-22 NOTE — Progress Notes (Signed)
Name: [name]   MRN: [id number]    DOB: 1961-09-17   Date:03/22/2017       Progress Note  Subjective  Chief Complaint  Chief Complaint  Patient presents with  . Acute Visit    upper respiratory symptoms x1 week    HPI  Chest Congestion: Present for more than 1 week, symptoms include coughing with mucus, mucus is usually greenish yellow in color, no upper respiratory symptoms but he has chronic seasonal allergies. Chest congestion is worse in morning, he has been taking Mucinex which seems to help.    Past Medical History:  Diagnosis Date  . Allergy    dust, seasonal (worse in the fall).  . Anginal pain (HCC)    1 OR 2 x IN 3 MONTHS  . 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 Followed by Pain Specialist in [place], back and neck  . Asthma    BRONCHITIS  . Cataract    First Dx in 2012  . Coronary artery disease    Chronic ischemic heart disease with multiple stents placed in the LAD and right coronary artery. He had prior anterior myocardial infarction. All done in [place].  . Diabetes mellitus without complication ([place])    TYPE 2  . Diabetic peripheral neuropathy (HCC)    feet and hands  . GERD (gastroesophageal reflux disease)   . Headache    muscle tension  . Hyperlipidemia   . Hypertension    CONTROLLED ON MEDS  . Insomnia   . Lyme disease    Chronic  . Myasthenia gravis ([place])    ? Just eyes or systemic  . Myasthenia gravis ([place])   . Myocardial infarction ([place]) 2010  . Seasonal allergies   . Shortness of breath dyspnea    EXERTION  . Sleep apnea    CPAP    Past Surgical History:  Procedure Laterality Date  . BILATERAL CARPAL TUNNEL RELEASE Bilateral L in 2012 and R in 2013  . CARDIAC CATHETERIZATION     Several Caths, most recent in  March 2016.  [name] COLONOSCOPY WITH PROPOFOL N/A 01/10/2016   Procedure: COLONOSCOPY WITH PROPOFOL;  Surgeon: [name], MD;  Location: [facility] END[facility];  Service: Endoscopy;  Laterality: N/A;  . CORONARY ANGIOPLASTY    .  ESOPHAGOGASTRODUODENOSCOPY (EGD) WITH PROPOFOL N/A 01/10/2016   Procedure: ESOPHAGOGASTRODUODENOSCOPY (EGD) WITH PROPOFOL;  Surgeon: [name], MD;  Location: [facility];  Service: Endoscopy;  Laterality: N/A;  . EYE SURGERY Bilateral 2012   cataract/bilateral vitrectomies  . TONSILLECTOMY AND ADENOIDECTOMY     As a child  . TUNNELED VENOUS CATHETER PLACEMENT     removed    Family History  Problem Relation Age of Onset  . Diabetes Mother   . Heart disease Mother   . Cancer Father        Prostate CA    Social History   Social History  . Marital status: Married    Spouse name: N/A  . Number of children: N/A  . Years of education: N/A   Occupational History  . Not on file.   Social History Main Topics  . Smoking status: Never Smoker  . Smokeless tobacco: Never Used  . Alcohol use 0.0 oz/week     Comment: occasional/1-2 BEERS PER YEAR  . Drug use: No  . Sexual activity: No   Other Topics Concern  . Not on file   Social History Narrative  . No narrative on file     Current Outpatient Prescriptions:  .  albuterol (PROVENTIL HFA;VENTOLIN HFA) 108 (90 Base) MCG/ACT inhaler, Inhale 2 puffs into the lungs every 6 (six) hours as needed for wheezing or shortness of breath., Disp: , Rfl:  .  albuterol (PROVENTIL) (2.5 MG/3ML) 0.083% nebulizer solution, Take 2.5 mg by nebulization every 6 (six) hours as needed for wheezing or shortness of breath., Disp: , Rfl:  .  amLODipine (NORVASC) 5 MG tablet, take 1 tablet by mouth every morning, Disp: 30 tablet, Rfl: 6 .  aspirin 325 MG tablet, Take 325 mg by mouth daily. AM, Disp: , Rfl:  .  augmented betamethasone dipropionate (DIPROLENE-AF) 0.05 % ointment, Apply topically 2 (two) times daily., Disp: 45 g, Rfl: 0 .  Benzoyl Peroxide 6 % LOTN, Apply 1 application topically 2 (two) times daily., Disp: 340.2 g, Rfl: 0 .  bisoprolol-hydrochlorothiazide (ZIAC) 2.5-6.25 MG tablet, Take 1 tablet by mouth daily., Disp: 30 tablet, Rfl: 5 .   cyanocobalamin 1000 MCG tablet, Take 1,000 mcg by mouth daily. am, Disp: , Rfl:  .  cyclobenzaprine (FLEXERIL) 10 MG tablet, Take 1 tablet (10 mg total) by mouth 3 (three) times daily as needed for muscle spasms., Disp: 90 tablet, Rfl: 5 .  Erythromycin 2 % ointment, Apply 1 application topically 2 (two) times daily., Disp: 25 g, Rfl: 0 .  fluticasone-salmeterol (ADVAIR HFA) 230-21 MCG/ACT inhaler, Inhale 1 puff into the lungs 2 (two) times daily. Reported on 01/09/2016, Disp: , Rfl:  .  gabapentin (NEURONTIN) 300 MG capsule, Take 1 capsule (300 mg total) by mouth 3 (three) times daily., Disp: 90 capsule, Rfl: 1 .  gabapentin (NEURONTIN) 800 MG tablet, Take 1 tablet (800 mg total) by mouth at bedtime., Disp: 90 tablet, Rfl: 0 .  glucose blood (BAYER CONTOUR TEST) test strip, Use as instructed, Disp: 100 each, Rfl: 2 .  insulin lispro (HUMALOG KWIKPEN) 100 UNIT/ML KiwkPen, Inject 8 Units into the skin 3 (three) times daily. Reported on 12/02/2015/ sliding scale, Disp: , Rfl:  .  LANTUS SOLOSTAR 100 UNIT/ML Solostar Pen, Inject 42 Units into the skin daily at 10 pm. , Disp: , Rfl: 0 .  liraglutide (VICTOZA) 18 MG/3ML SOPN, Inject 18 mg into the skin daily. , Disp: , Rfl:  .  Loratadine (CLARITIN PO), Take by mouth daily. am, Disp: , Rfl:  .  metFORMIN (GLUCOPHAGE) 1000 MG tablet, Take 1,000 mg by mouth 2 (two) times daily with a meal., Disp: , Rfl:  .  montelukast (SINGULAIR) 10 MG tablet, every morning. , Disp: , Rfl: 1 .  naloxone (NARCAN) nasal spray 4 mg/0.1 mL, For excess sedation from opioids, Disp: 1 kit, Rfl: 2 .  nitroGLYCERIN (NITROSTAT) 0.3 MG SL tablet, Place 1 tablet (0.3 mg total) under the tongue every 5 (five) minutes as needed for chest pain. Reported on 12/02/2015, Disp: 25 tablet, Rfl: 3 .  Oxycodone HCl 10 MG TABS, Take 1 tablet (10 mg total) by mouth 4 (four) times daily as needed., Disp: 120 tablet, Rfl: 0 .  pantoprazole (PROTONIX) 40 MG tablet, Take 1 tablet (40 mg total) by mouth 2  (two) times daily. **PT NEEDS FOLLOW UP APPT**, Disp: 60 tablet, Rfl: 1 .  pyridostigmine (MESTINON) 60 MG tablet, Take 60 mg by mouth 4 (four) times daily. , Disp: , Rfl:  .  triamcinolone ointment (KENALOG) 0.1 %, Apply 1 application topically 2 (two) times daily., Disp: 80 g, Rfl: 0 .  rosuvastatin (CRESTOR) 5 MG tablet, Take 2.5 mg by mouth daily at 6 PM. , Disp: , Rfl:  Current Facility-Administered Medications:  .  lidocaine (PF) (XYLOCAINE) 1 % injection 5 mL, 5 mL, Subcutaneous, Once, Adams, Alvina Filbert, MD .  lidocaine (PF) (XYLOCAINE) 1 % injection 5 mL, 5 mL, Subcutaneous, Once, Adams, Alvina Filbert, MD .  lidocaine (PF) (XYLOCAINE) 1 % injection 5 mL, 5 mL, Subcutaneous, Once, [name], Alvina Filbert, MD .  lidocaine (PF) (XYLOCAINE) 1 % injection 5 mL, 5 mL, Subcutaneous, Once, Molli Barrows, MD .  midazolam (VERSED) injection 5 mg, 5 mg, Intravenous, Once, Molli Barrows, MD .  ropivacaine (PF) 2 mg/ml (0.2%) (NAROPIN) epidural 10 mL, 10 mL, Epidural, Once, Molli Barrows, MD .  ropivacaine (PF) 2 mg/ml (0.2%) (NAROPIN) epidural 10 mL, 10 mL, Epidural, Once, Molli Barrows, MD .  ropivacaine (PF) 2 mg/ml (0.2%) (NAROPIN) epidural 10 mL, 10 mL, Epidural, Once, Molli Barrows, MD .  ropivacaine (PF) 2 mg/ml (0.2%) (NAROPIN) epidural 10 mL, 10 mL, Epidural, Once, Molli Barrows, MD .  sodium chloride flush (NS) 0.9 % injection 10 mL, 10 mL, Other, Once, Molli Barrows, MD .  sodium chloride flush (NS) 0.9 % injection 10 mL, 10 mL, Other, Once, Molli Barrows, MD .  triamcinolone acetonide (KENALOG-40) injection 40 mg, 40 mg, Other, Once, Molli Barrows, MD .  triamcinolone acetonide (KENALOG-40) injection 40 mg, 40 mg, Other, Once, Molli Barrows, MD .  triamcinolone acetonide (KENALOG-40) injection 40 mg, 40 mg, Other, Once, Molli Barrows, MD .  triamcinolone acetonide (KENALOG-40) injection 40 mg, 40 mg, Other, Once, [name] Alvina Filbert, MD  Allergies  Allergen Reactions  . Metoprolol Rash  .  Novolog [Insulin Aspart] Hives     Review of Systems  Constitutional: Negative for chills and fever.  Respiratory: Positive for cough, sputum production and shortness of breath. Negative for wheezing.   Cardiovascular: Negative for chest pain.      Objective  Vitals:   03/22/17 1319  BP: 117/74  Pulse: (!) 116  Resp: 17  Temp: 97.8 F (36.6 C)  TempSrc: Oral  SpO2: 93%  Weight: 234 lb 6.4 oz (106.3 kg)  Height: _0  (1.702 m)    Physical Exam  Constitutional: He is oriented to person, place, and time and well-developed, well-nourished, and in no distress.  HENT:  Head: Normocephalic and atraumatic.  Nose: Right sinus exhibits no maxillary sinus tenderness. Left sinus exhibits no maxillary sinus tenderness.  Mouth/Throat: Oropharynx is clear and moist.  Cardiovascular: Normal rate, regular rhythm and normal heart sounds.   No murmur heard. Pulmonary/Chest: Effort normal and breath sounds normal. He has no wheezes.  Neurological: He is alert and oriented to person, place, and time.  Psychiatric: Mood, memory, affect and judgment normal.  Nursing note and vitals reviewed.      Assessment & Plan  1. Chest congestion Given duration of symptoms, will start on antibiotic - azithromycin (ZITHROMAX) 250 MG tablet; 2 tabs po day 1,then 1 tab po q day x 4 days  Dispense: 6 tablet; Refill: 0  2. Cough in adult patient  - benzonatate (TESSALON) 100 MG capsule; Take 1 capsule (100 mg total) by mouth 3 (three) times daily as needed for cough.  Dispense: 20 capsule; Refill: 0    Anija Brickner [facility] 03/22/2017 2:05 PM

## 2017-03-28 ENCOUNTER — Other Ambulatory Visit: Payer: Self-pay | Admitting: Anesthesiology

## 2017-03-29 ENCOUNTER — Telehealth: Payer: Self-pay | Admitting: Anesthesiology

## 2017-03-29 NOTE — Telephone Encounter (Signed)
Patient called stating his script for Cyclobenzaprine is not at pharmacy, per pharmacy, please verify and let patient know he can get scripts. Thank you

## 2017-03-30 NOTE — Telephone Encounter (Signed)
With Dr. [name] permission a refill for 30 days was called into [facility] on [address]. One po tid prn muscle spasm- Quantity 90 with No refills. Patient has appointment next week for med refills. Patient called and informed.

## 2017-04-01 ENCOUNTER — Encounter: Payer: Self-pay | Admitting: Family Medicine

## 2017-04-05 ENCOUNTER — Ambulatory Visit: Payer: Self-pay

## 2017-04-08 ENCOUNTER — Encounter: Payer: Self-pay | Admitting: Family Medicine

## 2017-04-08 ENCOUNTER — Ambulatory Visit (INDEPENDENT_AMBULATORY_CARE_PROVIDER_SITE_OTHER): Payer: Medicare Other | Admitting: Cardiovascular Disease

## 2017-04-08 ENCOUNTER — Ambulatory Visit: Payer: Medicare Other | Admitting: Anesthesiology

## 2017-04-08 VITALS — BP 86/68 | HR 100 | Ht 67.0 in | Wt 238.0 lb

## 2017-04-08 DIAGNOSIS — I10 Essential (primary) hypertension: Secondary | ICD-10-CM | POA: Diagnosis not present

## 2017-04-08 DIAGNOSIS — E785 Hyperlipidemia, unspecified: Secondary | ICD-10-CM

## 2017-04-08 DIAGNOSIS — I25118 Atherosclerotic heart disease of native coronary artery with other forms of angina pectoris: Secondary | ICD-10-CM | POA: Diagnosis not present

## 2017-04-08 MED ORDER — CARVEDILOL 3.125 MG PO TABS: 3.1250 mg | ORAL_TABLET | Freq: Two times a day (BID) | ORAL | 5 refills | Status: DC

## 2017-04-08 NOTE — Patient Instructions (Signed)
Medication Instructions:  Your physician has recommended you make the following change in your medication:  STOP taking bisoprolol-HCTZ STOP taking amlodipine  START taking coreg 3.125mg  twice daily   Labwork: none  Testing/Procedures: none  Follow-Up: Your physician wants you to follow-up in: 6 months. You will receive a reminder letter in the mail two months in advance. If you don't receive a letter, please call our office to schedule the follow-up appointment.   Any Other Special Instructions Will Be Listed Below (If Applicable). Please monitor your blood pressure and heart rate daily for one week then call us with readings.      If you need a refill on your cardiac medications before your next appointment, please call your pharmacy.

## 2017-04-08 NOTE — Progress Notes (Signed)
Cardiology Office Note   Date:  04/08/2017   ID:  [name], DOB 23-Aug-1961, MRN [id number]  PCP:  Roselee Nova, MD  Cardiologist:   [name], MD   Chief Complaint  Patient presents with  . other    early 6 month f/u per pt call- Pt c/o low bp. Reviewed meds with pt verbally.      History of Present Illness: [name] is a 56 y.o. male who presents for a follow-up visit regarding coronary artery disease.  He has extensive cardiac history. He had a total of 10 stents placed (in LAD and RCA) starting in 2009 after he presented with myocardial infarction. Most recent cardiac catheterization according to him was in March 2016 which showed no obstructive disease and he was treated medically. He has other chronic medical conditions that include diabetes, hypertension, hyperlipidemia,myasthenia gravis,  generalized pain due to reported Lyme disease and obesity.  Echocardiogram in January 2017 Showed normal LV systolic function and wall motion with no evidence of pulmonary hypertension. He has history of hyperlipidemia with intolerance to high-dose statins.  During last visit, he reported worsening dyspnea and also orthostatic dizziness. He underwent a nuclear stress test which showed evidence of prior mid to distal anterior and apical infarct without significant ischemia. EF was mildly reduced at 45-54%.  I switched him from bisoprolol hydrochlorothiazide to metoprolol. He developed a rash and was switched back to a prolonged hydrochlorothiazide especially that he was having mild hyperkalemia.  He was taking half a tablet of bisoprolol hydrochlorothiazide but it appears that when it was resumed, he started taking one full tablet daily. He noted decreased blood pressure associated with fatigue. He stopped the medication 2 days ago and feels slightly better. He continues to complain of exertional palpitations. He continues to take amlodipine 5 mg daily.  Past Medical History:    Diagnosis Date  . Allergy    dust, seasonal (worse in the fall).  . Anginal pain (HCC)    1 OR 2 x IN 3 MONTHS  . 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 Followed by Pain Specialist in [place], back and neck  . Asthma    BRONCHITIS  . Cataract    First Dx in 2012  . Coronary artery disease    Chronic ischemic heart disease with multiple stents placed in the LAD and right coronary artery. He had prior anterior myocardial infarction. All done in [place].  . Diabetes mellitus without complication ([place])    TYPE 2  . Diabetic peripheral neuropathy (HCC)    feet and hands  . GERD (gastroesophageal reflux disease)   . Headache    muscle tension  . Hyperlipidemia   . Hypertension    CONTROLLED ON MEDS  . Insomnia   . Lyme disease    Chronic  . Myasthenia gravis ([place])    ? Just eyes or systemic  . Myasthenia gravis ([place])   . Myocardial infarction ([place]) 2010  . Seasonal allergies   . Shortness of breath dyspnea    EXERTION  . Sleep apnea    CPAP    Past Surgical History:  Procedure Laterality Date  . BILATERAL CARPAL TUNNEL RELEASE Bilateral L in 2012 and R in 2013  . CARDIAC CATHETERIZATION     Several Caths, most recent in  March 2016.  [name] COLONOSCOPY WITH PROPOFOL N/A 01/10/2016   Procedure: COLONOSCOPY WITH PROPOFOL;  Surgeon: [name], MD;  Location: [facility] ENDOSCOPY;  Service: Endoscopy;  Laterality: N/A;  .  CORONARY ANGIOPLASTY    . ESOPHAGOGASTRODUODENOSCOPY (EGD) WITH PROPOFOL N/A 01/10/2016   Procedure: ESOPHAGOGASTRODUODENOSCOPY (EGD) WITH PROPOFOL;  Surgeon: Midge Minium, MD;  Location: [facility] ENDOSCOPY;  Service: Endoscopy;  Laterality: N/A;  . EYE SURGERY Bilateral 2012   cataract/bilateral vitrectomies  . TONSILLECTOMY AND ADENOIDECTOMY     As a child  . TUNNELED VENOUS CATHETER PLACEMENT     removed     Current Outpatient Prescriptions  Medication Sig Dispense Refill  . albuterol (PROVENTIL HFA;VENTOLIN HFA) 108 (90 Base) MCG/ACT inhaler Inhale 2 puffs into the lungs  every 6 (six) hours as needed for wheezing or shortness of breath.    [name] albuterol (PROVENTIL) (2.5 MG/3ML) 0.083% nebulizer solution Take 2.5 mg by nebulization every 6 (six) hours as needed for wheezing or shortness of breath.    [name] amLODipine (NORVASC) 5 MG tablet take 1 tablet by mouth every morning 30 tablet 6  . aspirin 325 MG tablet Take 325 mg by mouth daily. AM    . augmented betamethasone dipropionate (DIPROLENE-AF) 0.05 % ointment Apply topically 2 (two) times daily. 45 g 0  . azithromycin (ZITHROMAX) 250 MG tablet 2 tabs po day 1,then 1 tab po q day x 4 days 6 tablet 0  . benzonatate (TESSALON) 100 MG capsule Take 1 capsule (100 mg total) by mouth 3 (three) times daily as needed for cough. 20 capsule 0  . Benzoyl Peroxide 6 % LOTN Apply 1 application topically 2 (two) times daily. 340.2 g 0  . bisoprolol-hydrochlorothiazide (ZIAC) 2.5-6.25 MG tablet Take 1 tablet by mouth daily. 30 tablet 5  . cyanocobalamin 1000 MCG tablet Take 1,000 mcg by mouth daily. am    . cyclobenzaprine (FLEXERIL) 10 MG tablet take 1 tablet by mouth three times a day if needed for muscle spasm 90 tablet 5  . Erythromycin 2 % ointment Apply 1 application topically 2 (two) times daily. 25 g 0  . FLOVENT HFA 220 MCG/ACT inhaler Inhale 2 puffs into the lungs 2 (two) times daily.  0  . fluticasone-salmeterol (ADVAIR HFA) 230-21 MCG/ACT inhaler Inhale 1 puff into the lungs 2 (two) times daily. Reported on 01/09/2016    . gabapentin (NEURONTIN) 300 MG capsule Take 1 capsule (300 mg total) by mouth 3 (three) times daily. 90 capsule 1  . gabapentin (NEURONTIN) 800 MG tablet Take 1 tablet (800 mg total) by mouth at bedtime. 90 tablet 0  . glucose blood (BAYER CONTOUR TEST) test strip Use as instructed 100 each 2  . insulin lispro (HUMALOG KWIKPEN) 100 UNIT/ML KiwkPen Inject 8 Units into the skin 3 (three) times daily. Reported on 12/02/2015/ sliding scale    . LANTUS SOLOSTAR 100 UNIT/ML Solostar Pen Inject 42 Units into the  skin daily at 10 pm.   0  . liraglutide (VICTOZA) 18 MG/3ML SOPN Inject 18 mg into the skin daily.     . Loratadine (CLARITIN PO) Take by mouth daily. am    . metFORMIN (GLUCOPHAGE) 1000 MG tablet Take 1,000 mg by mouth 2 (two) times daily with a meal.    . montelukast (SINGULAIR) 10 MG tablet every morning.   1  . naloxone (NARCAN) nasal spray 4 mg/0.1 mL For excess sedation from opioids 1 kit 2  . nitroGLYCERIN (NITROSTAT) 0.3 MG SL tablet Place 1 tablet (0.3 mg total) under the tongue every 5 (five) minutes as needed for chest pain. Reported on 12/02/2015 25 tablet 3  . Oxycodone HCl 10 MG TABS Take 1 tablet (10 mg total) by  mouth 4 (four) times daily as needed. 120 tablet 0  . pantoprazole (PROTONIX) 40 MG tablet Take 1 tablet (40 mg total) by mouth 2 (two) times daily. **PT NEEDS FOLLOW UP APPT** 60 tablet 1  . pyridostigmine (MESTINON) 60 MG tablet Take 60 mg by mouth 4 (four) times daily.     . rosuvastatin (CRESTOR) 5 MG tablet Take 2.5 mg by mouth daily at 6 PM.     . triamcinolone ointment (KENALOG) 0.1 % Apply 1 application topically 2 (two) times daily. 80 g 0   Current Facility-Administered Medications  Medication Dose Route Frequency Provider Last Rate Last Dose  . lidocaine (PF) (XYLOCAINE) 1 % injection 5 mL  5 mL Subcutaneous Once Yevette Edwards, MD      . lidocaine (PF) (XYLOCAINE) 1 % injection 5 mL  5 mL Subcutaneous Once Yevette Edwards, MD      . lidocaine (PF) (XYLOCAINE) 1 % injection 5 mL  5 mL Subcutaneous Once Yevette Edwards, MD      . lidocaine (PF) (XYLOCAINE) 1 % injection 5 mL  5 mL Subcutaneous Once Yevette Edwards, MD      . midazolam (VERSED) injection 5 mg  5 mg Intravenous Once Yevette Edwards, MD      . ropivacaine (PF) 2 mg/ml (0.2%) (NAROPIN) epidural 10 mL  10 mL Epidural Once Yevette Edwards, MD      . ropivacaine (PF) 2 mg/ml (0.2%) (NAROPIN) epidural 10 mL  10 mL Epidural Once Yevette Edwards, MD      . ropivacaine (PF) 2 mg/ml (0.2%) (NAROPIN) epidural 10  mL  10 mL Epidural Once Yevette Edwards, MD      . ropivacaine (PF) 2 mg/ml (0.2%) (NAROPIN) epidural 10 mL  10 mL Epidural Once Yevette Edwards, MD      . sodium chloride flush (NS) 0.9 % injection 10 mL  10 mL Other Once Yevette Edwards, MD      . sodium chloride flush (NS) 0.9 % injection 10 mL  10 mL Other Once Yevette Edwards, MD      . triamcinolone acetonide (KENALOG-40) injection 40 mg  40 mg Other Once Yevette Edwards, MD      . triamcinolone acetonide (KENALOG-40) injection 40 mg  40 mg Other Once Yevette Edwards, MD      . triamcinolone acetonide (KENALOG-40) injection 40 mg  40 mg Other Once Yevette Edwards, MD      . triamcinolone acetonide (KENALOG-40) injection 40 mg  40 mg Other Once Yevette Edwards, MD        Allergies:   Metoprolol and Novolog [insulin aspart]    Social History:  The patient  reports that he has never smoked. He has never used smokeless tobacco. He reports that he drinks alcohol. He reports that he does not use drugs.   Family History:  The patient's family history includes Cancer in his father; Diabetes in his mother; Heart disease in his mother.    ROS:  Please see the history of present illness.   Otherwise, review of systems are positive for none.   All other systems are reviewed and negative.    PHYSICAL EXAM: VS:  BP (!) 86/68 (BP Location: Left Arm, Patient Position: Sitting, Cuff Size: Normal)   Pulse 100   Ht 5\' 7"  (1.702 m)   Wt 238 lb (108 kg)   BMI 37.28 kg/m  , BMI Body mass index is 37.28  kg/m. GEN: Well nourished, well developed, in no acute distress  HEENT: normal  Neck: no JVD, carotid bruits, or masses Cardiac: RRR; no murmurs, rubs, or gallops,no edema  Respiratory:  clear to auscultation bilaterally, normal work of breathing GI: soft, nontender, nondistended, + BS MS: no deformity or atrophy  Skin: warm and dry, no rash Neuro:  Strength and sensation are intact Psych: euthymic mood, full affect   EKG:  EKG is ordered today. The  ekg ordered today demonstrates normal sinus rhythm with anterolateral infarct. Possible old inferior infarct.   Recent Labs: No results found for requested labs within last 8760 hours.    Lipid Panel No results found for: CHOL, TRIG, HDL, CHOLHDL, VLDL, LDLCALC, LDLDIRECT    Wt Readings from Last 3 Encounters:  04/08/17 238 lb (108 kg)  03/22/17 234 lb 6.4 oz (106.3 kg)  03/03/17 238 lb (108 kg)        ASSESSMENT AND PLAN:  1.  Coronary artery disease involving native coronary arteries with other forms of angina:   Recent nuclear stress test showed evidence of prior inferior infarct without ischemia. Continue medical therapy.  2. Essential hypertension:  His blood pressure is running low and he continues to be symptomatic with that including dizziness and fatigue. I elected to discontinue bisoprolol-hydrochlorothiazide as well as amlodipine. I started him on small dose carvedilol 3.125 mg twice daily. I asked him to monitor heart rate and blood pressure over the next week and call us with the numbers.  3. Hyperlipidemia: Unfortunately, he is only able to tolerate small dose rosuvastatin 2.5 mg once daily. His LDL did improve initially on this dose to 73 but most recently was 94. Continue with diet and we might consider adding Zetia.  4. Obesity:  His weight is stable.  Disposition:   FU with me in 6 months  Signed,  [name], MD  04/08/2017 2:50 PM     [facility]

## 2017-04-09 ENCOUNTER — Telehealth: Payer: Self-pay

## 2017-04-09 NOTE — Telephone Encounter (Signed)
Disp Refills Start End   carvedilol (COREG) 3.125 MG tablet 60 tablet 5 04/08/2017    Sig - Route: Take 1 tablet (3.125 mg total) by mouth 2 (two) times daily. - Oral   E-Prescribing Status: Receipt confirmed by pharmacy (04/08/2017 2:51 PM EDT)

## 2017-04-09 NOTE — Telephone Encounter (Signed)
Pt is calling regarding his BP medication was to be sent to his [place]. Please call pt and advise if this was sent

## 2017-04-09 NOTE — Telephone Encounter (Signed)
Lmovm to verify medication needed to be sent to local pharmacy.

## 2017-04-12 ENCOUNTER — Encounter: Payer: Self-pay | Admitting: Gastroenterology

## 2017-04-12 ENCOUNTER — Ambulatory Visit (INDEPENDENT_AMBULATORY_CARE_PROVIDER_SITE_OTHER): Payer: Medicare Other | Admitting: Gastroenterology

## 2017-04-12 ENCOUNTER — Other Ambulatory Visit: Payer: Self-pay

## 2017-04-12 VITALS — BP 137/82 | HR 115 | Temp 97.7°F | Ht 67.0 in | Wt 239.0 lb

## 2017-04-12 DIAGNOSIS — K219 Gastro-esophageal reflux disease without esophagitis: Secondary | ICD-10-CM

## 2017-04-12 NOTE — Progress Notes (Signed)
Primary Care Physician: Roselee Nova, MD  Primary Gastroenterologist:  Dr. [name]  Chief Complaint  Patient presents with  . Medication follow up    HPI: [name] is a 56 y.o. male here due to the need for medication refill.  The patient states that he has been doing well but has had episodes where he he has severe acid reflux for possibly one week.  The patient has been on Protonix twice a day while he was having this acid reflux breakthrough.  The patient denies any nausea vomiting fevers or chills.  The patient has lost 30 pounds due to starting new medication for his diabetes.  The patient has had an EGD and colonoscopy in 2017.  He does not need a repeat colonoscopy for 5 years due to an adenomatous polyp.  Current Outpatient Prescriptions  Medication Sig Dispense Refill  . albuterol (PROVENTIL HFA;VENTOLIN HFA) 108 (90 Base) MCG/ACT inhaler Inhale 2 puffs into the lungs every 6 (six) hours as needed for wheezing or shortness of breath.    [name] albuterol (PROVENTIL) (2.5 MG/3ML) 0.083% nebulizer solution Take 2.5 mg by nebulization every 6 (six) hours as needed for wheezing or shortness of breath.    [name] aspirin 325 MG tablet Take 325 mg by mouth daily. AM    . carvedilol (COREG) 3.125 MG tablet Take 1 tablet (3.125 mg total) by mouth 2 (two) times daily. 60 tablet 5  . cyanocobalamin 1000 MCG tablet Take 1,000 mcg by mouth daily. am    . cyclobenzaprine (FLEXERIL) 10 MG tablet take 1 tablet by mouth three times a day if needed for muscle spasm 90 tablet 5  . FLOVENT HFA 220 MCG/ACT inhaler Inhale 2 puffs into the lungs 2 (two) times daily.  0  . fluticasone-salmeterol (ADVAIR HFA) 230-21 MCG/ACT inhaler Inhale 1 puff into the lungs 2 (two) times daily. Reported on 01/09/2016    . gabapentin (NEURONTIN) 300 MG capsule Take 1 capsule (300 mg total) by mouth 3 (three) times daily. 90 capsule 1  . gabapentin (NEURONTIN) 800 MG tablet Take 1 tablet (800 mg total) by mouth at  bedtime. 90 tablet 0  . glucose blood (BAYER CONTOUR TEST) test strip Use as instructed 100 each 2  . insulin lispro (HUMALOG KWIKPEN) 100 UNIT/ML KiwkPen Inject 8 Units into the skin 3 (three) times daily. Reported on 12/02/2015/ sliding scale    . LANTUS SOLOSTAR 100 UNIT/ML Solostar Pen Inject 42 Units into the skin daily at 10 pm.   0  . liraglutide (VICTOZA) 18 MG/3ML SOPN Inject 18 mg into the skin daily.     . Loratadine (CLARITIN PO) Take by mouth daily. am    . metFORMIN (GLUCOPHAGE) 1000 MG tablet Take 1,000 mg by mouth 2 (two) times daily with a meal.    . montelukast (SINGULAIR) 10 MG tablet every morning.   1  . Oxycodone HCl 10 MG TABS Take 1 tablet (10 mg total) by mouth 4 (four) times daily as needed. 120 tablet 0  . pantoprazole (PROTONIX) 40 MG tablet Take 1 tablet (40 mg total) by mouth 2 (two) times daily. **PT NEEDS FOLLOW UP APPT** 60 tablet 1  . pyridostigmine (MESTINON) 60 MG tablet Take 60 mg by mouth 4 (four) times daily.     [name] triamcinolone cream (KENALOG) 0.1 % apply to affected area twice a day UNTIL CLEAR  0  . triamcinolone ointment (KENALOG) 0.1 % Apply 1 application topically 2 (two) times daily. 80  g 0  . amLODipine (NORVASC) 5 MG tablet Take 5 mg by mouth every morning.  0  . augmented betamethasone dipropionate (DIPROLENE-AF) 0.05 % ointment Apply topically 2 (two) times daily. (Patient not taking: Reported on 04/12/2017) 45 g 0  . azithromycin (ZITHROMAX) 250 MG tablet 2 tabs po day 1,then 1 tab po q day x 4 days (Patient not taking: Reported on 04/12/2017) 6 tablet 0  . benzonatate (TESSALON) 100 MG capsule Take 1 capsule (100 mg total) by mouth 3 (three) times daily as needed for cough. (Patient not taking: Reported on 04/12/2017) 20 capsule 0  . Benzoyl Peroxide 6 % LOTN Apply 1 application topically 2 (two) times daily. (Patient not taking: Reported on 04/12/2017) 340.2 g 0  . bisoprolol-hydrochlorothiazide (ZIAC) 2.5-6.25 MG tablet   0  . Erythromycin 2 % ointment  Apply 1 application topically 2 (two) times daily. (Patient not taking: Reported on 04/12/2017) 25 g 0  . naloxone (NARCAN) nasal spray 4 mg/0.1 mL For excess sedation from opioids (Patient not taking: Reported on 04/12/2017) 1 kit 2  . nitroGLYCERIN (NITROSTAT) 0.3 MG SL tablet Place 1 tablet (0.3 mg total) under the tongue every 5 (five) minutes as needed for chest pain. Reported on 12/02/2015 (Patient not taking: Reported on 04/12/2017) 25 tablet 3  . rosuvastatin (CRESTOR) 5 MG tablet Take 2.5 mg by mouth daily at 6 PM.      Current Facility-Administered Medications  Medication Dose Route Frequency Provider Last Rate Last Dose  . lidocaine (PF) (XYLOCAINE) 1 % injection 5 mL  5 mL Subcutaneous Once Molli Barrows, MD      . lidocaine (PF) (XYLOCAINE) 1 % injection 5 mL  5 mL Subcutaneous Once Molli Barrows, MD      . lidocaine (PF) (XYLOCAINE) 1 % injection 5 mL  5 mL Subcutaneous Once Molli Barrows, MD      . lidocaine (PF) (XYLOCAINE) 1 % injection 5 mL  5 mL Subcutaneous Once Molli Barrows, MD      . midazolam (VERSED) injection 5 mg  5 mg Intravenous Once Molli Barrows, MD      . ropivacaine (PF) 2 mg/ml (0.2%) (NAROPIN) epidural 10 mL  10 mL Epidural Once Molli Barrows, MD      . ropivacaine (PF) 2 mg/ml (0.2%) (NAROPIN) epidural 10 mL  10 mL Epidural Once Molli Barrows, MD      . ropivacaine (PF) 2 mg/ml (0.2%) (NAROPIN) epidural 10 mL  10 mL Epidural Once Molli Barrows, MD      . ropivacaine (PF) 2 mg/ml (0.2%) (NAROPIN) epidural 10 mL  10 mL Epidural Once Molli Barrows, MD      . sodium chloride flush (NS) 0.9 % injection 10 mL  10 mL Other Once Molli Barrows, MD      . sodium chloride flush (NS) 0.9 % injection 10 mL  10 mL Other Once Molli Barrows, MD      . triamcinolone acetonide (KENALOG-40) injection 40 mg  40 mg Other Once Molli Barrows, MD      . triamcinolone acetonide (KENALOG-40) injection 40 mg  40 mg Other Once Molli Barrows, MD      . triamcinolone acetonide  (KENALOG-40) injection 40 mg  40 mg Other Once Molli Barrows, MD      . triamcinolone acetonide (KENALOG-40) injection 40 mg  40 mg Other Once Molli Barrows, MD  Allergies as of 04/12/2017 - Review Complete 04/12/2017  Allergen Reaction Noted  . Metoprolol Rash 03/03/2017  . Novolog [insulin aspart] Hives 09/24/2015    ROS:  General: Negative for anorexia, weight loss, fever, chills, fatigue, weakness. ENT: Negative for hoarseness, difficulty swallowing , nasal congestion. CV: Negative for chest pain, angina, palpitations, dyspnea on exertion, peripheral edema.  Respiratory: Negative for dyspnea at rest, dyspnea on exertion, cough, sputum, wheezing.  GI: See history of present illness. GU:  Negative for dysuria, hematuria, urinary incontinence, urinary frequency, nocturnal urination.  Endo: Negative for unusual weight change.    Physical Examination:   BP 137/82   Pulse (!) 115   Temp 97.7 F (36.5 C) (Oral)   Ht '5\' 7"'$  (1.702 m)   Wt 239 lb (108.4 kg)   BMI 37.43 kg/m   General: Well-nourished, well-developed in no acute distress.  Eyes: No icterus. Conjunctivae pink. Extremities: No lower extremity edema. No clubbing or deformities. Neuro: Alert and oriented x 3.  Grossly intact. Skin: Warm and dry, no jaundice.   Psych: Alert and cooperative, normal mood and affect.  Labs:    Imaging Studies: No results found.  Assessment and Plan:   [name] is a 56 y.o. y/o male who comes in for a prescription refill and has been having some acid breakthrough on his Protonix twice a day.  The patient will be switched to Dexilant to be taken in the evening every night.  The patient will contact me if this works and a prescription will be sent in.  The patient has been explained the plan and agrees with it.    [name], MD. [facility]al   Note: This dictation was prepared with Dragon dictation along with smaller phrase technology. Any transcriptional errors that result  from this process are unintentional.

## 2017-05-04 ENCOUNTER — Telehealth: Payer: Self-pay | Admitting: Gastroenterology

## 2017-05-04 NOTE — Telephone Encounter (Signed)
Patient left a voice message that he was given samples of Dexilant 60 mg and they are working fine and needs a Careers information officer called into [facility] on [address]

## 2017-05-05 ENCOUNTER — Telehealth: Payer: Self-pay | Admitting: Cardiovascular Disease

## 2017-05-05 ENCOUNTER — Other Ambulatory Visit: Payer: Self-pay

## 2017-05-05 ENCOUNTER — Telehealth: Payer: Self-pay | Admitting: Gastroenterology

## 2017-05-05 MED ORDER — DEXLANSOPRAZOLE 60 MG PO CPDR: 60.0000 mg | DELAYED_RELEASE_CAPSULE | Freq: Every day | ORAL | 6 refills | Status: DC

## 2017-05-05 NOTE — Telephone Encounter (Signed)
Pt is calling with BP readings: BP he states went back to "normal" 117/85. This weekend he has had 3 episodes from a range of 80/60, Saturday pm, Sunday pm, and earlier today. Please call.

## 2017-05-05 NOTE — Telephone Encounter (Signed)
No answer. Left message to call back.   

## 2017-05-05 NOTE — Telephone Encounter (Signed)
*  STAT* If patient is at the pharmacy, call can be transferred to refill team.   1. Which medications need to be refilled? (please list name of each medication and dose if known) Dexilant  2. Which pharmacy/location (including street and city if local pharmacy) is medication to be sent to?[place]  3. Do they need a 30 day or 90 day supply?[place]

## 2017-05-06 NOTE — Telephone Encounter (Signed)
Rx for Dexilant has been sent to pt's pharmacy per his request.

## 2017-05-07 NOTE — Telephone Encounter (Signed)
Pt states that his episodes occur when he goes from sitting to standing position or from touching toes to standing. Patient encouraged to changed positions slowly. Pt will continue to monitor BP and notify office of any changes.

## 2017-05-10 ENCOUNTER — Ambulatory Visit: Payer: Medicare Other | Attending: Anesthesiology | Admitting: Anesthesiology

## 2017-05-10 ENCOUNTER — Encounter: Payer: Self-pay | Admitting: Anesthesiology

## 2017-05-10 VITALS — BP 141/94 | HR 101 | Temp 98.6°F | Resp 18 | Ht 67.0 in | Wt 235.0 lb

## 2017-05-10 DIAGNOSIS — M5431 Sciatica, right side: Secondary | ICD-10-CM | POA: Diagnosis not present

## 2017-05-10 DIAGNOSIS — M4697 Unspecified inflammatory spondylopathy, lumbosacral region: Secondary | ICD-10-CM | POA: Diagnosis not present

## 2017-05-10 DIAGNOSIS — G894 Chronic pain syndrome: Secondary | ICD-10-CM

## 2017-05-10 DIAGNOSIS — M542 Cervicalgia: Secondary | ICD-10-CM | POA: Diagnosis not present

## 2017-05-10 DIAGNOSIS — F1190 Opioid use, unspecified, uncomplicated: Secondary | ICD-10-CM

## 2017-05-10 DIAGNOSIS — M47817 Spondylosis without myelopathy or radiculopathy, lumbosacral region: Secondary | ICD-10-CM

## 2017-05-10 MED ORDER — ROPIVACAINE HCL 2 MG/ML IJ SOLN
INTRAMUSCULAR | Status: AC
  Filled 2017-05-10: qty 10

## 2017-05-10 MED ORDER — DEXAMETHASONE SODIUM PHOSPHATE 10 MG/ML IJ SOLN
INTRAMUSCULAR | Status: AC
  Filled 2017-05-10: qty 1

## 2017-05-10 MED ORDER — OXYCODONE HCL 10 MG PO TABS: 10.0000 mg | ORAL_TABLET | Freq: Four times a day (QID) | ORAL | 0 refills | Status: DC | PRN

## 2017-05-10 MED ORDER — ROPIVACAINE HCL 2 MG/ML IJ SOLN
10.0000 mL | Freq: Once | INTRAMUSCULAR | Status: AC
  Administered 2017-05-10: 10 mL via EPIDURAL

## 2017-05-10 MED ORDER — DEXAMETHASONE SODIUM PHOSPHATE 10 MG/ML IJ SOLN
10.0000 mg | Freq: Once | INTRAMUSCULAR | Status: AC
  Administered 2017-05-10: 10 mg

## 2017-05-10 NOTE — Progress Notes (Signed)
Nursing Pain Medication Assessment:  Safety precautions to be maintained throughout the outpatient stay will include: orient to surroundings, keep bed in low position, maintain call bell within reach at all times, provide assistance with transfer out of bed and ambulation.  Medication Inspection Compliance: Pill count conducted under aseptic conditions, in front of the patient. Neither the pills nor the bottle was removed from the patient's sight at any time. Once count was completed pills were immediately returned to the patient in their original bottle.  Medication: Oxycodone IR Pill/Patch Count: 67 of 120 pills remain Pill/Patch Appearance: Markings consistent with prescribed medication Bottle Appearance: Standard pharmacy container. Clearly labeled. Filled Date: 07 / 24 / 2018 Last Medication intake:  Today

## 2017-05-10 NOTE — Progress Notes (Signed)
Subjective:  Patient ID: [name], male    DOB: 1961/08/26  Age: 56 y.o. MRN: [id number]  CC: Neck Pain (center and right side) and Back Pain (lower)   HPI [name] presents for Return evaluation. He was last seen 2 months ago for his low back. He has chronic pain in the facet region of the low back which is primarily right-sided. This pain has been stable in nature but continues to have chronic aching gnawing components that require intermittent facet blocks. Furthermore he takes oxycodone and has been on this regimen for many years. Medications work well to keep his pain under good control. Based on his narcotic assessment sheet this continues to work well for him with minimal side effects and an overall improvement in his lifestyle and function. He states that at this point he is having some recurrence of his right side facet pain and would like to schedule a repeat facet injection in 2 months. Unfortunately conservative measures have failed to gain significant improvement in his overall function. His last facet injection was last year.  He also presents today complaining of some neck pain. He is complaining of crepitus and a grinding feeling with lateral rotation of his head. He's having some neck spasming as well. He denies any problems with upper extremity strength or function. This pain does radiate into the base of his neck and causes some tension like headaches in the posterior neck region as well. No other side effects are noted.  Outpatient Medications Prior to Visit  Medication Sig Dispense Refill  . albuterol (PROVENTIL HFA;VENTOLIN HFA) 108 (90 Base) MCG/ACT inhaler Inhale 2 puffs into the lungs every 6 (six) hours as needed for wheezing or shortness of breath.    [name] albuterol (PROVENTIL) (2.5 MG/3ML) 0.083% nebulizer solution Take 2.5 mg by nebulization every 6 (six) hours as needed for wheezing or shortness of breath.    [name] amLODipine (NORVASC) 5 MG tablet Take 5 mg by  mouth every morning.  0  . aspirin 325 MG tablet Take 325 mg by mouth daily. AM    . carvedilol (COREG) 3.125 MG tablet Take 1 tablet (3.125 mg total) by mouth 2 (two) times daily. 60 tablet 5  . cyanocobalamin 1000 MCG tablet Take 1,000 mcg by mouth daily. am    . cyclobenzaprine (FLEXERIL) 10 MG tablet take 1 tablet by mouth three times a day if needed for muscle spasm 90 tablet 5  . dexlansoprazole (DEXILANT) 60 MG capsule Take 1 capsule (60 mg total) by mouth daily. 30 capsule 6  . FLOVENT HFA 220 MCG/ACT inhaler Inhale 2 puffs into the lungs 2 (two) times daily.  0  . fluticasone-salmeterol (ADVAIR HFA) 230-21 MCG/ACT inhaler Inhale 1 puff into the lungs 2 (two) times daily. Reported on 01/09/2016    . gabapentin (NEURONTIN) 300 MG capsule Take 1 capsule (300 mg total) by mouth 3 (three) times daily. 90 capsule 1  . gabapentin (NEURONTIN) 800 MG tablet Take 1 tablet (800 mg total) by mouth at bedtime. 90 tablet 0  . glucose blood (BAYER CONTOUR TEST) test strip Use as instructed 100 each 2  . insulin lispro (HUMALOG KWIKPEN) 100 UNIT/ML KiwkPen Inject 8 Units into the skin 3 (three) times daily. Reported on 12/02/2015/ sliding scale    . LANTUS SOLOSTAR 100 UNIT/ML Solostar Pen Inject 42 Units into the skin daily at 10 pm.   0  . liraglutide (VICTOZA) 18 MG/3ML SOPN Inject 18 mg into the skin daily.     [name]  Loratadine (CLARITIN PO) Take by mouth daily. am    . metFORMIN (GLUCOPHAGE) 1000 MG tablet Take 1,000 mg by mouth 2 (two) times daily with a meal.    . montelukast (SINGULAIR) 10 MG tablet every morning.   1  . naloxone (NARCAN) nasal spray 4 mg/0.1 mL For excess sedation from opioids 1 kit 2  . nitroGLYCERIN (NITROSTAT) 0.3 MG SL tablet Place 1 tablet (0.3 mg total) under the tongue every 5 (five) minutes as needed for chest pain. Reported on 12/02/2015 25 tablet 3  . pyridostigmine (MESTINON) 60 MG tablet Take 60 mg by mouth 4 (four) times daily.     [name] triamcinolone cream (KENALOG) 0.1 %  apply to affected area twice a day UNTIL CLEAR  0  . Oxycodone HCl 10 MG TABS Take 1 tablet (10 mg total) by mouth 4 (four) times daily as needed. 120 tablet 0  . rosuvastatin (CRESTOR) 5 MG tablet Take 2.5 mg by mouth daily at 6 PM.     . augmented betamethasone dipropionate (DIPROLENE-AF) 0.05 % ointment Apply topically 2 (two) times daily. (Patient not taking: Reported on 04/12/2017) 45 g 0  . azithromycin (ZITHROMAX) 250 MG tablet 2 tabs po day 1,then 1 tab po q day x 4 days (Patient not taking: Reported on 04/12/2017) 6 tablet 0  . benzonatate (TESSALON) 100 MG capsule Take 1 capsule (100 mg total) by mouth 3 (three) times daily as needed for cough. (Patient not taking: Reported on 04/12/2017) 20 capsule 0  . Benzoyl Peroxide 6 % LOTN Apply 1 application topically 2 (two) times daily. (Patient not taking: Reported on 04/12/2017) 340.2 g 0  . bisoprolol-hydrochlorothiazide (ZIAC) 2.5-6.25 MG tablet   0  . Erythromycin 2 % ointment Apply 1 application topically 2 (two) times daily. (Patient not taking: Reported on 04/12/2017) 25 g 0  . pantoprazole (PROTONIX) 40 MG tablet Take 1 tablet (40 mg total) by mouth 2 (two) times daily. **PT NEEDS FOLLOW UP APPT** (Patient not taking: Reported on 05/10/2017) 60 tablet 1  . triamcinolone ointment (KENALOG) 0.1 % Apply 1 application topically 2 (two) times daily. (Patient not taking: Reported on 05/10/2017) 80 g 0   Facility-Administered Medications Prior to Visit  Medication Dose Route Frequency Provider Last Rate Last Dose  . lidocaine (PF) (XYLOCAINE) 1 % injection 5 mL  5 mL Subcutaneous Once Molli Barrows, MD      . lidocaine (PF) (XYLOCAINE) 1 % injection 5 mL  5 mL Subcutaneous Once Molli Barrows, MD      . lidocaine (PF) (XYLOCAINE) 1 % injection 5 mL  5 mL Subcutaneous Once Molli Barrows, MD      . lidocaine (PF) (XYLOCAINE) 1 % injection 5 mL  5 mL Subcutaneous Once Molli Barrows, MD      . midazolam (VERSED) injection 5 mg  5 mg Intravenous Once Molli Barrows, MD      . ropivacaine (PF) 2 mg/ml (0.2%) (NAROPIN) epidural 10 mL  10 mL Epidural Once Molli Barrows, MD      . ropivacaine (PF) 2 mg/ml (0.2%) (NAROPIN) epidural 10 mL  10 mL Epidural Once Molli Barrows, MD      . ropivacaine (PF) 2 mg/ml (0.2%) (NAROPIN) epidural 10 mL  10 mL Epidural Once Molli Barrows, MD      . ropivacaine (PF) 2 mg/ml (0.2%) (NAROPIN) epidural 10 mL  10 mL Epidural Once Molli Barrows, MD      .  sodium chloride flush (NS) 0.9 % injection 10 mL  10 mL Other Once Molli Barrows, MD      . sodium chloride flush (NS) 0.9 % injection 10 mL  10 mL Other Once Molli Barrows, MD      . triamcinolone acetonide (KENALOG-40) injection 40 mg  40 mg Other Once Molli Barrows, MD      . triamcinolone acetonide (KENALOG-40) injection 40 mg  40 mg Other Once Molli Barrows, MD      . triamcinolone acetonide (KENALOG-40) injection 40 mg  40 mg Other Once Molli Barrows, MD      . triamcinolone acetonide (KENALOG-40) injection 40 mg  40 mg Other Once Molli Barrows, MD       ROS Review of Systems  Objective:  BP (!) 154/96   Pulse 99   Temp 98.6 F (37 C)   Resp 16   Ht _0  (1.702 m)   Wt 235 lb (106.6 kg)   SpO2 99%   BMI 36.81 kg/m    BP Readings from Last 3 Encounters:  05/10/17 (!) 154/96  04/12/17 137/82  04/08/17 (!) 86/68     Wt Readings from Last 3 Encounters:  05/10/17 235 lb (106.6 kg)  04/12/17 239 lb (108.4 kg)  04/08/17 238 lb (108 kg)     Physical Exam  PERRL EOMI HEART IS RRR  LCTA MUSCULOSKELETAL examination of the cervical region reveals some bilateral paraspinous muscle tenderness in the cervical region approximately T1 C7 region. He does have pain with lateral range of motion at the atlantooccipital joint. Good muscle tone and bulk is noted in the trapezius musculature no evidence of erythema or infection and good upper extremity strength. Furthermore he is also having some pain with extension and lateral rotation to the right  consistent with his previous pain complaints. This has improved significantly in the past with previous facet blocks and seems to be more aggravated now I do not appreciate any lumbar paraspinous muscle trigger points.  Labs  Lab Results  Component Value Date   HGBA1C 8.1 08/22/2015   No results found for: GLUF, MICROALBUR, LDLCALC, CREATININE  -------------------------------------------------------------------------------------------------------------------- Lab Results  Component Value Date   ALT 16 12/02/2015   AST 16 12/02/2015   HGBA1C 8.1 08/22/2015    --------------------------------------------------------------------------------------------------------------------- Nm Myocar Multi W/spect W/wall Motion / Ef  Result Date: 02/05/2017  There was no ST segment deviation noted during stress.  Defect 1: There is a medium defect of severe severity present in the apical anterior, apical inferior and apex location.  Findings consistent with prior myocardial infarction. No ischemia.  This is an intermediate risk study.  The left ventricular ejection fraction is mildly decreased (45-54%).      Assessment & Plan:   [name] was seen today for neck pain and back pain.  Diagnoses and all orders for this visit:  Cervicalgia -     dexamethasone (DECADRON) injection 10 mg; 1 mL (10 mg total) by Other route once. -     ropivacaine (PF) 2 mg/mL (0.2%) (NAROPIN) injection 10 mL; 10 mLs by Epidural route once.  Facet arthritis of lumbosacral region ([place]) -     LUMBAR FACET(MEDIAL BRANCH NERVE BLOCK) MBNB; Future  Chronic pain syndrome  Sciatica of right side  Chronic, continuous use of opioids  Other orders -     Discontinue: Oxycodone HCl 10 MG TABS; Take 1 tablet (10 mg total) by mouth 4 (four) times daily as needed. -  Oxycodone HCl 10 MG TABS; Take 1 tablet (10 mg total) by mouth 4 (four) times daily as  needed.        ----------------------------------------------------------------------------------------------------------------------  Problem List Items Addressed This Visit    None    Visit Diagnoses    Cervicalgia    -  Primary   Relevant Medications   dexamethasone (DECADRON) injection 10 mg (Start on 05/10/2017  1:45 PM)   ropivacaine (PF) 2 mg/mL (0.2%) (NAROPIN) injection 10 mL (Start on 05/10/2017  1:45 PM)   Facet arthritis of lumbosacral region [facility])       Relevant Medications   Oxycodone HCl 10 MG TABS   dexamethasone (DECADRON) injection 10 mg (Start on 05/10/2017  1:45 PM)   Other Relevant Orders   LUMBAR FACET(MEDIAL BRANCH NERVE BLOCK) MBNB   Chronic pain syndrome       Sciatica of right side       Chronic, continuous use of opioids            ----------------------------------------------------------------------------------------------------------------------  1. Cervicalgia We'll proceed with a cervical trigger point injection to the aforementioned trigger points at bilateral C7-T1. I've given him some exercises for stretching strengthening with return to clinic in 2 months. We have gone over the risks and benefits of the procedure in full detail. - dexamethasone (DECADRON) injection 10 mg; 1 mL (10 mg total) by Other route once. - ropivacaine (PF) 2 mg/mL (0.2%) (NAROPIN) injection 10 mL; 10 mLs by Epidural route once.  2. Facet arthritis of lumbosacral region [facility]) Reschedule for return to clinic in 2 months for a right lumbar facet block. - LUMBAR FACET(MEDIAL BRANCH NERVE BLOCK) MBNB; Future  3. Chronic pain syndrome Refill her medications for oxycodone 10 mg tablets for this 23 and September 22 to be taken 4 times a day  4. Sciatica of right side Continue stretching strengthening exercises  5. Chronic, continuous use of opioids As  above    ----------------------------------------------------------------------------------------------------------------------  I have discontinued [name]'s augmented betamethasone dipropionate, triamcinolone ointment, Erythromycin, Benzoyl Peroxide, pantoprazole, azithromycin, benzonatate, and bisoprolol-hydrochlorothiazide. I am also having him maintain his insulin lispro, pyridostigmine, Loratadine (CLARITIN PO), albuterol, glucose blood, gabapentin, gabapentin, rosuvastatin, metFORMIN, cyanocobalamin, aspirin, fluticasone-salmeterol, albuterol, montelukast, LANTUS SOLOSTAR, naloxone, liraglutide, nitroGLYCERIN, cyclobenzaprine, FLOVENT HFA, carvedilol, amLODipine, triamcinolone cream, dexlansoprazole, and Oxycodone HCl. We will continue to administer lidocaine (PF), midazolam, ropivacaine (PF) 2 mg/mL (0.2%), sodium chloride flush, triamcinolone acetonide, triamcinolone acetonide, sodium chloride flush, ropivacaine (PF) 2 mg/mL (0.2%), lidocaine (PF), triamcinolone acetonide, ropivacaine (PF) 2 mg/mL (0.2%), lidocaine (PF), triamcinolone acetonide, ropivacaine (PF) 2 mg/mL (0.2%), and lidocaine (PF).   Meds ordered this encounter  Medications  . DISCONTD: Oxycodone HCl 10 MG TABS    Sig: Take 1 tablet (10 mg total) by mouth 4 (four) times daily as needed.    Dispense:  120 tablet    Refill:  0    Do not fill until [id number]  . Oxycodone HCl 10 MG TABS    Sig: Take 1 tablet (10 mg total) by mouth 4 (four) times daily as needed.    Dispense:  120 tablet    Refill:  0    Do not fill until [id number]  . dexamethasone (DECADRON) injection 10 mg  . ropivacaine (PF) 2 mg/mL (0.2%) (NAROPIN) injection 10 mL   Patient's Medications  New Prescriptions   No medications on file  Previous Medications   ALBUTEROL (PROVENTIL HFA;VENTOLIN HFA) 108 (90 BASE) MCG/ACT INHALER    Inhale 2 puffs into the lungs every 6 (  six) hours as needed for wheezing or shortness of breath.   ALBUTEROL (PROVENTIL)  (2.5 MG/3ML) 0.083% NEBULIZER SOLUTION    Take 2.5 mg by nebulization every 6 (six) hours as needed for wheezing or shortness of breath.   AMLODIPINE (NORVASC) 5 MG TABLET    Take 5 mg by mouth every morning.   ASPIRIN 325 MG TABLET    Take 325 mg by mouth daily. AM   CARVEDILOL (COREG) 3.125 MG TABLET    Take 1 tablet (3.125 mg total) by mouth 2 (two) times daily.   CYANOCOBALAMIN 1000 MCG TABLET    Take 1,000 mcg by mouth daily. am   CYCLOBENZAPRINE (FLEXERIL) 10 MG TABLET    take 1 tablet by mouth three times a day if needed for muscle spasm   DEXLANSOPRAZOLE (DEXILANT) 60 MG CAPSULE    Take 1 capsule (60 mg total) by mouth daily.   FLOVENT HFA 220 MCG/ACT INHALER    Inhale 2 puffs into the lungs 2 (two) times daily.   FLUTICASONE-SALMETEROL (ADVAIR HFA) 230-21 MCG/ACT INHALER    Inhale 1 puff into the lungs 2 (two) times daily. Reported on 01/09/2016   GABAPENTIN (NEURONTIN) 300 MG CAPSULE    Take 1 capsule (300 mg total) by mouth 3 (three) times daily.   GABAPENTIN (NEURONTIN) 800 MG TABLET    Take 1 tablet (800 mg total) by mouth at bedtime.   GLUCOSE BLOOD (BAYER CONTOUR TEST) TEST STRIP    Use as instructed   INSULIN LISPRO (HUMALOG KWIKPEN) 100 UNIT/ML KIWKPEN    Inject 8 Units into the skin 3 (three) times daily. Reported on 12/02/2015/ sliding scale   LANTUS SOLOSTAR 100 UNIT/ML SOLOSTAR PEN    Inject 42 Units into the skin daily at 10 pm.    LIRAGLUTIDE (VICTOZA) 18 MG/3ML SOPN    Inject 18 mg into the skin daily.    LORATADINE (CLARITIN PO)    Take by mouth daily. am   METFORMIN (GLUCOPHAGE) 1000 MG TABLET    Take 1,000 mg by mouth 2 (two) times daily with a meal.   MONTELUKAST (SINGULAIR) 10 MG TABLET    every morning.    NALOXONE (NARCAN) NASAL SPRAY 4 MG/0.1 ML    For excess sedation from opioids   NITROGLYCERIN (NITROSTAT) 0.3 MG SL TABLET    Place 1 tablet (0.3 mg total) under the tongue every 5 (five) minutes as needed for chest pain. Reported on 12/02/2015   PYRIDOSTIGMINE  (MESTINON) 60 MG TABLET    Take 60 mg by mouth 4 (four) times daily.    ROSUVASTATIN (CRESTOR) 5 MG TABLET    Take 2.5 mg by mouth daily at 6 PM.    TRIAMCINOLONE CREAM (KENALOG) 0.1 %    apply to affected area twice a day UNTIL CLEAR  Modified Medications   Modified Medication Previous Medication   OXYCODONE HCL 10 MG TABS Oxycodone HCl 10 MG TABS      Take 1 tablet (10 mg total) by mouth 4 (four) times daily as needed.    Take 1 tablet (10 mg total) by mouth 4 (four) times daily as needed.  Discontinued Medications   AUGMENTED BETAMETHASONE DIPROPIONATE (DIPROLENE-AF) 0.05 % OINTMENT    Apply topically 2 (two) times daily.   AZITHROMYCIN (ZITHROMAX) 250 MG TABLET    2 tabs po day 1,then 1 tab po q day x 4 days   BENZONATATE (TESSALON) 100 MG CAPSULE    Take 1 capsule (100 mg total) by mouth 3 (three)  times daily as needed for cough.   BENZOYL PEROXIDE 6 % LOTN    Apply 1 application topically 2 (two) times daily.   BISOPROLOL-HYDROCHLOROTHIAZIDE (ZIAC) 2.5-6.25 MG TABLET       ERYTHROMYCIN 2 % OINTMENT    Apply 1 application topically 2 (two) times daily.   PANTOPRAZOLE (PROTONIX) 40 MG TABLET    Take 1 tablet (40 mg total) by mouth 2 (two) times daily. **PT NEEDS FOLLOW UP APPT**   TRIAMCINOLONE OINTMENT (KENALOG) 0.1 %    Apply 1 application topically 2 (two) times daily.   ----------------------------------------------------------------------------------------------------------------------  Follow-up: Return for evaluation, procedure.   Cervical trigger point injection at C7-T1 bilateral ML andTrigger point injection: The area overlying the aforementioned trigger points were prepped with alcohol. They were then injected with a 25-gauge needle with 4 cc of ropivacaine 0.2% and Decadron 4 mg at each site after negative aspiration for heme. This was performed after informed consent was obtained and risks and benefits reviewed. She tolerated this procedure without difficulty and was convalesced  and discharged to home in stable condition for follow-up as mentioned.  _0  [name], MD@     Molli Barrows, MD

## 2017-05-10 NOTE — Patient Instructions (Signed)
You were given 2 prescriptions for Oxycodone today. Facet Blocks Patient Information  Description: The facets are joints in the spine between the vertebrae.  Like any joints in the body, facets can become irritated and painful.  Arthritis can also effect the facets.  By injecting steroids and local anesthetic in and around these joints, we can temporarily block the nerve supply to them.  Steroids act directly on irritated nerves and tissues to reduce selling and inflammation which often leads to decreased pain.  Facet blocks may be done anywhere along the spine from the neck to the low back depending upon the location of your pain.   After numbing the skin with local anesthetic (like Novocaine), a small needle is passed onto the facet joints under x-ray guidance.  You may experience a sensation of pressure while this is being done.  The entire block usually lasts about 15-25 minutes.   Conditions which may be treated by facet blocks:   Low back/buttock pain  Neck/shoulder pain  Certain types of headaches  Preparation for the injection:  1. Do not eat any solid food or dairy products within 8 hours of your appointment. 2. You may drink clear liquid up to 3 hours before appointment.  Clear liquids include water, black coffee, juice or soda.  No milk or cream please. 3. You may take your regular medication, including pain medications, with a sip of water before your appointment.  Diabetics should hold regular insulin (if taken separately) and take 1/2 normal NPH dose the morning of the procedure.  Carry some sugar containing items with you to your appointment. 4. A driver must accompany you and be prepared to drive you home after your procedure. 5. Bring all your current medications with you. 6. An IV may be inserted and sedation may be given at the discretion of the physician. 7. A blood pressure cuff, EKG and other monitors will often be applied during the procedure.  Some patients may need to  have extra oxygen administered for a short period. 8. You will be asked to provide medical information, including your allergies and medications, prior to the procedure.  We must know immediately if you are taking blood thinners (like Coumadin/Warfarin) or if you are allergic to IV iodine contrast (dye).  We must know if you could possible be pregnant.  Possible side-effects:   Bleeding from needle site  Infection (rare, may require surgery)  Nerve injury (rare)  Numbness & tingling (temporary)  Difficulty urinating (rare, temporary)  Spinal headache (a headache worse with upright posture)  Light-headedness (temporary)  Pain at injection site (serveral days)  Decreased blood pressure (rare, temporary)  Weakness in arm/leg (temporary)  Pressure sensation in back/neck (temporary)   Call if you experience:   Fever/chills associated with headache or increased back/neck pain  Headache worsened by an upright position  New onset, weakness or numbness of an extremity below the injection site  Hives or difficulty breathing (go to the emergency room)  Inflammation or drainage at the injection site(s)  Severe back/neck pain greater than usual  New symptoms which are concerning to you  Please note:  Although the local anesthetic injected can often make your back or neck feel good for several hours after the injection, the pain will likely return. It takes 3-7 days for steroids to work.  You may not notice any pain relief for at least one week.  If effective, we will often do a series of 2-3 injections spaced 3-6 weeks apart to  maximally decrease your pain.  After the initial series, you may be a candidate for a more permanent nerve block of the facets.  If you have any questions, please call #[phone]) [facility]

## 2017-05-11 ENCOUNTER — Telehealth: Payer: Self-pay | Admitting: *Deleted

## 2017-05-11 NOTE — Telephone Encounter (Signed)
Voicemail left with patient to call our office if there are questions or concerns re; procedure on yesterday.  

## 2017-05-24 ENCOUNTER — Ambulatory Visit: Payer: Self-pay

## 2017-05-25 ENCOUNTER — Encounter: Payer: Self-pay | Admitting: Family Medicine

## 2017-06-16 ENCOUNTER — Ambulatory Visit (INDEPENDENT_AMBULATORY_CARE_PROVIDER_SITE_OTHER): Payer: Medicare Other | Admitting: Family Medicine

## 2017-06-16 ENCOUNTER — Encounter: Payer: Self-pay | Admitting: Family Medicine

## 2017-06-16 VITALS — BP 139/83 | HR 113 | Temp 97.4°F | Resp 17 | Ht 67.0 in | Wt 233.0 lb

## 2017-06-16 DIAGNOSIS — Z1159 Encounter for screening for other viral diseases: Secondary | ICD-10-CM | POA: Diagnosis not present

## 2017-06-16 DIAGNOSIS — Z736 Limitation of activities due to disability: Secondary | ICD-10-CM | POA: Diagnosis not present

## 2017-06-16 DIAGNOSIS — Z125 Encounter for screening for malignant neoplasm of prostate: Secondary | ICD-10-CM | POA: Diagnosis not present

## 2017-06-16 DIAGNOSIS — Z23 Encounter for immunization: Secondary | ICD-10-CM

## 2017-06-16 DIAGNOSIS — Z0000 Encounter for general adult medical examination without abnormal findings: Secondary | ICD-10-CM | POA: Diagnosis not present

## 2017-06-16 NOTE — Progress Notes (Signed)
Name: [name]   MRN: [id number]    DOB: [birth year]   Date:06/16/2017       Progress Note  Subjective  Chief Complaint  Chief Complaint  Patient presents with  . Annual Exam    CPE    HPI  Pt. Presents for a Complete Physical Exam. He had colonoscopy in April 2017, repeat in 2022.  He is due for Prostate cancer screening.  He is due for Hepatitis C screening.   Patient is also requesting completion of disability paperwork.   Past Medical History:  Diagnosis Date  . Allergy    dust, seasonal (worse in the fall).  . Anginal pain (HCC)    1 OR 2 x IN 3 MONTHS  . 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 Followed by Pain Specialist in [place], back and neck  . Asthma    BRONCHITIS  . Cataract    First Dx in 2012  . Coronary artery disease    Chronic ischemic heart disease with multiple stents placed in the LAD and right coronary artery. He had prior anterior myocardial infarction. All done in [place].  . Diabetes mellitus without complication ([place])    TYPE 2  . Diabetic peripheral neuropathy (HCC)    feet and hands  . GERD (gastroesophageal reflux disease)   . Headache    muscle tension  . Hyperlipidemia   . Hypertension    CONTROLLED ON MEDS  . Insomnia   . Lyme disease    Chronic  . Myasthenia gravis ([place])    ? Just eyes or systemic  . Myasthenia gravis ([place])   . Myocardial infarction ([place]) 2010  . Seasonal allergies   . Shortness of breath dyspnea    EXERTION  . Sleep apnea    CPAP    Past Surgical History:  Procedure Laterality Date  . BILATERAL CARPAL TUNNEL RELEASE Bilateral L in 2012 and R in 2013  . CARDIAC CATHETERIZATION     Several Caths, most recent in  March 2016.  [name] COLONOSCOPY WITH PROPOFOL N/A 01/10/2016   Procedure: COLONOSCOPY WITH PROPOFOL;  Surgeon: [name], MD;  Location: [facility] END[facility];  Service: Endoscopy;  Laterality: N/A;  . CORONARY ANGIOPLASTY    . ESOPHAGOGASTRODUODENOSCOPY (EGD) WITH PROPOFOL N/A 01/10/2016   Procedure:  ESOPHAGOGASTRODUODENOSCOPY (EGD) WITH PROPOFOL;  Surgeon: [name], MD;  Location: [facility] END[facility];  Service: Endoscopy;  Laterality: N/A;  . EYE SURGERY Bilateral 2012   cataract/bilateral vitrectomies  . TONSILLECTOMY AND ADENOIDECTOMY     As a child  . TUNNELED VENOUS CATHETER PLACEMENT     removed    Family History  Problem Relation Age of Onset  . Diabetes Mother   . Heart disease Mother   . Cancer Father        Prostate CA    Social History   Social History  . Marital status: Married    Spouse name: N/A  . Number of children: N/A  . Years of education: N/A   Occupational History  . Not on file.   Social History Main Topics  . Smoking status: Never Smoker  . Smokeless tobacco: Never Used  . Alcohol use 0.0 oz/week     Comment: occasional/1-2 BEERS PER YEAR  . Drug use: No  . Sexual activity: No   Other Topics Concern  . Not on file   Social History Narrative  . No narrative on file     Current Outpatient Prescriptions:  .  albuterol (PROVENTIL HFA;VENTOLIN HFA) 108 (90 Base)  MCG/ACT inhaler, Inhale 2 puffs into the lungs every 6 (six) hours as needed for wheezing or shortness of breath., Disp: , Rfl:  .  albuterol (PROVENTIL) (2.5 MG/3ML) 0.083% nebulizer solution, Take 2.5 mg by nebulization every 6 (six) hours as needed for wheezing or shortness of breath., Disp: , Rfl:  .  aspirin 325 MG tablet, Take 325 mg by mouth daily. AM, Disp: , Rfl:  .  carvedilol (COREG) 3.125 MG tablet, Take 1 tablet (3.125 mg total) by mouth 2 (two) times daily., Disp: 60 tablet, Rfl: 5 .  cyanocobalamin 1000 MCG tablet, Take 1,000 mcg by mouth daily. am, Disp: , Rfl:  .  cyclobenzaprine (FLEXERIL) 10 MG tablet, take 1 tablet by mouth three times a day if needed for muscle spasm, Disp: 90 tablet, Rfl: 5 .  dexlansoprazole (DEXILANT) 60 MG capsule, Take 1 capsule (60 mg total) by mouth daily., Disp: 30 capsule, Rfl: 6 .  FLOVENT HFA 220 MCG/ACT inhaler, Inhale 2 puffs into the  lungs 2 (two) times daily., Disp: , Rfl: 0 .  fluticasone-salmeterol (ADVAIR HFA) 230-21 MCG/ACT inhaler, Inhale 1 puff into the lungs 2 (two) times daily. Reported on 01/09/2016, Disp: , Rfl:  .  gabapentin (NEURONTIN) 300 MG capsule, Take 1 capsule (300 mg total) by mouth 3 (three) times daily., Disp: 90 capsule, Rfl: 1 .  gabapentin (NEURONTIN) 800 MG tablet, Take 1 tablet (800 mg total) by mouth at bedtime., Disp: 90 tablet, Rfl: 0 .  glucose blood (BAYER CONTOUR TEST) test strip, Use as instructed, Disp: 100 each, Rfl: 2 .  insulin lispro (HUMALOG KWIKPEN) 100 UNIT/ML KiwkPen, Inject 8 Units into the skin 3 (three) times daily. Reported on 12/02/2015/ sliding scale, Disp: , Rfl:  .  LANTUS SOLOSTAR 100 UNIT/ML Solostar Pen, Inject 42 Units into the skin daily at 10 pm. , Disp: , Rfl: 0 .  liraglutide (VICTOZA) 18 MG/3ML SOPN, Inject 18 mg into the skin daily. , Disp: , Rfl:  .  Loratadine (CLARITIN PO), Take by mouth daily. am, Disp: , Rfl:  .  metFORMIN (GLUCOPHAGE) 1000 MG tablet, Take 1,000 mg by mouth 2 (two) times daily with a meal., Disp: , Rfl:  .  montelukast (SINGULAIR) 10 MG tablet, every morning. , Disp: , Rfl: 1 .  naloxone (NARCAN) nasal spray 4 mg/0.1 mL, For excess sedation from opioids, Disp: 1 kit, Rfl: 2 .  nitroGLYCERIN (NITROSTAT) 0.3 MG SL tablet, Place 1 tablet (0.3 mg total) under the tongue every 5 (five) minutes as needed for chest pain. Reported on 12/02/2015, Disp: 25 tablet, Rfl: 3 .  Oxycodone HCl 10 MG TABS, Take 1 tablet (10 mg total) by mouth 4 (four) times daily as needed., Disp: 120 tablet, Rfl: 0 .  pyridostigmine (MESTINON) 60 MG tablet, Take 60 mg by mouth 4 (four) times daily. , Disp: , Rfl:  .  triamcinolone cream (KENALOG) 0.1 %, apply to affected area twice a day UNTIL CLEAR, Disp: , Rfl: 0 .  rosuvastatin (CRESTOR) 5 MG tablet, Take 2.5 mg by mouth daily at 6 PM. , Disp: , Rfl:   Current Facility-Administered Medications:  .  lidocaine (PF) (XYLOCAINE) 1  % injection 5 mL, 5 mL, Subcutaneous, Once, Adams, Alvina Filbert, MD .  lidocaine (PF) (XYLOCAINE) 1 % injection 5 mL, 5 mL, Subcutaneous, Once, Adams, Alvina Filbert, MD .  lidocaine (PF) (XYLOCAINE) 1 % injection 5 mL, 5 mL, Subcutaneous, Once, Molli Barrows, MD .  lidocaine (PF) (XYLOCAINE) 1 % injection 5  mL, 5 mL, Subcutaneous, Once, Molli Barrows, MD .  midazolam (VERSED) injection 5 mg, 5 mg, Intravenous, Once, Molli Barrows, MD .  ropivacaine (PF) 2 mg/ml (0.2%) (NAROPIN) epidural 10 mL, 10 mL, Epidural, Once, Molli Barrows, MD .  ropivacaine (PF) 2 mg/ml (0.2%) (NAROPIN) epidural 10 mL, 10 mL, Epidural, Once, Molli Barrows, MD .  ropivacaine (PF) 2 mg/ml (0.2%) (NAROPIN) epidural 10 mL, 10 mL, Epidural, Once, Molli Barrows, MD .  ropivacaine (PF) 2 mg/ml (0.2%) (NAROPIN) epidural 10 mL, 10 mL, Epidural, Once, Molli Barrows, MD .  sodium chloride flush (NS) 0.9 % injection 10 mL, 10 mL, Other, Once, Molli Barrows, MD .  sodium chloride flush (NS) 0.9 % injection 10 mL, 10 mL, Other, Once, Molli Barrows, MD .  triamcinolone acetonide (KENALOG-40) injection 40 mg, 40 mg, Other, Once, Molli Barrows, MD .  triamcinolone acetonide (KENALOG-40) injection 40 mg, 40 mg, Other, Once, Molli Barrows, MD .  triamcinolone acetonide (KENALOG-40) injection 40 mg, 40 mg, Other, Once, Molli Barrows, MD .  triamcinolone acetonide (KENALOG-40) injection 40 mg, 40 mg, Other, Once, [name] Alvina Filbert, MD  Allergies  Allergen Reactions  . Metoprolol Rash  . Novolog [Insulin Aspart] Hives     Review of Systems  Constitutional: Positive for malaise/fatigue. Negative for chills and fever.  HENT: Positive for congestion, ear pain and sinus pain. Negative for sore throat.   Eyes: Negative for blurred vision and double vision.  Respiratory: Positive for cough and shortness of breath. Negative for sputum production.   Cardiovascular: Positive for leg swelling. Negative for chest pain and palpitations.   Gastrointestinal: Positive for abdominal pain and heartburn. Negative for blood in stool, constipation, diarrhea, nausea and vomiting.  Genitourinary: Negative for dysuria and hematuria.  Musculoskeletal: Positive for back pain and neck pain.  Skin: Negative for itching and rash (chronic occasional flre up of rash on left lower leg).  Neurological: Positive for dizziness and headaches.  Psychiatric/Behavioral: Negative for depression. The patient is not nervous/anxious.       Objective  Vitals:   06/16/17 1357  BP: 139/83  Pulse: (!) 113  Resp: 17  Temp: (!) 97.4 F (36.3 C)  TempSrc: Oral  SpO2: 96%  Weight: 233 lb (105.7 kg)  Height: '5\' 7"'$  (1.702 m)    Physical Exam  Constitutional: He is oriented to person, place, and time and well-developed, well-nourished, and in no distress.  HENT:  Head: Normocephalic and atraumatic.  Neck: Neck supple.  Cardiovascular: Normal rate, regular rhythm and normal heart sounds.   No murmur heard. Pulmonary/Chest: Effort normal and breath sounds normal. He has no wheezes.  Abdominal: Soft. Bowel sounds are normal. There is no tenderness.  Genitourinary: Rectum normal and prostate normal. Prostate is not enlarged and not tender.  Neurological: He is alert and oriented to person, place, and time.  Psychiatric: Mood, memory, affect and judgment normal.  Nursing note and vitals reviewed.    Assessment & Plan  1. Annual physical exam Obtain age-appropriate laboratory screenings - CBC with Differential/Platelet - TSH - VITAMIN D 25 Hydroxy (Vit-D Deficiency, Fractures) - Lipid panel  2. Screening for prostate cancer  - PSA  3. Need for hepatitis C screening test  - Hepatitis C antibody  4. Limitation due to disability Referral to physical therapy for functional capacity evaluation for completion of disability paperwork from his insurance company - Ambulatory referral to Physical Therapy  5. Need for influenza vaccination  -  Flu Vaccine QUAD 6+ mos PF IM (Fluarix Quad PF)  Leilah Polimeni [facility] 06/16/2017 2:10 PM

## 2017-07-07 ENCOUNTER — Ambulatory Visit: Payer: Medicare Other | Attending: Family Medicine | Admitting: Physical Therapy

## 2017-07-07 DIAGNOSIS — M5442 Lumbago with sciatica, left side: Secondary | ICD-10-CM | POA: Diagnosis present

## 2017-07-07 DIAGNOSIS — R262 Difficulty in walking, not elsewhere classified: Secondary | ICD-10-CM | POA: Diagnosis present

## 2017-07-07 DIAGNOSIS — M6281 Muscle weakness (generalized): Secondary | ICD-10-CM | POA: Diagnosis present

## 2017-07-07 DIAGNOSIS — R293 Abnormal posture: Secondary | ICD-10-CM | POA: Insufficient documentation

## 2017-07-07 DIAGNOSIS — M542 Cervicalgia: Secondary | ICD-10-CM | POA: Diagnosis present

## 2017-07-07 DIAGNOSIS — G8929 Other chronic pain: Secondary | ICD-10-CM | POA: Diagnosis present

## 2017-07-08 NOTE — Therapy (Signed)
[facility] [address] [place], [place], [address] Phone: [phone]   Fax:  [phone]  Physical Therapy Evaluation  Patient Details  Name: [name] MRN: [id number] Date of Birth: July 10, 1961 Referring Provider: Dr. Manuella Ghazi  Encounter Date: 07/07/2017      PT End of Session - 07/08/17 0656    Visit Number 1   Number of Visits 1   Date for PT Re-Evaluation 07/08/17   PT Start Time 0802   PT Stop Time 0916   PT Time Calculation (min) 74 min   Equipment Utilized During Treatment Gait belt   Activity Tolerance Patient limited by pain;Patient limited by fatigue   Behavior During Therapy [facility] for tasks assessed/performed      Past Medical History:  Diagnosis Date  . Allergy    dust, seasonal (worse in the fall).  . Anginal pain (HCC)    1 OR 2 x IN 3 MONTHS  . 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 Followed by Pain Specialist in [place], back and neck  . Asthma    BRONCHITIS  . Cataract    First Dx in 2012  . Coronary artery disease    Chronic ischemic heart disease with multiple stents placed in the LAD and right coronary artery. He had prior anterior myocardial infarction. All done in [place].  . Diabetes mellitus without complication ([place])    TYPE 2  . Diabetic peripheral neuropathy (HCC)    feet and hands  . GERD (gastroesophageal reflux disease)   . Headache    muscle tension  . Hyperlipidemia   . Hypertension    CONTROLLED ON MEDS  . Insomnia   . Lyme disease    Chronic  . Myasthenia gravis ([place])    ? Just eyes or systemic  . Myasthenia gravis ([place])   . Myocardial infarction ([place]) 2010  . Seasonal allergies   . Shortness of breath dyspnea    EXERTION  . Sleep apnea    CPAP    Past Surgical History:  Procedure Laterality Date  . BILATERAL CARPAL TUNNEL RELEASE Bilateral L in 2012 and R in 2013  . CARDIAC CATHETERIZATION     Several Caths, most recent in  March 2016.  [name] COLONOSCOPY WITH PROPOFOL N/A  01/10/2016   Procedure: COLONOSCOPY WITH PROPOFOL;  Surgeon: [name], MD;  Location: [facility] ENDOSCOPY;  Service: Endoscopy;  Laterality: N/A;  . CORONARY ANGIOPLASTY    . ESOPHAGOGASTRODUODENOSCOPY (EGD) WITH PROPOFOL N/A 01/10/2016   Procedure: ESOPHAGOGASTRODUODENOSCOPY (EGD) WITH PROPOFOL;  Surgeon: [name], MD;  Location: [facility] ENDOSCOPY;  Service: Endoscopy;  Laterality: N/A;  . EYE SURGERY Bilateral 2012   cataract/bilateral vitrectomies  . TONSILLECTOMY AND ADENOIDECTOMY     As a child  . TUNNELED VENOUS CATHETER PLACEMENT     removed    There were no vitals filed for this visit.       Subjective Assessment - 07/07/17 0801    Subjective See FCE report   Pain Score 7    Pain Location Neck   Pain Orientation Lower   Multiple Pain Sites Yes   Pain Score 7   Pain Location Back   Pain Orientation Left;Right   Pain Descriptors / Indicators Radiating   Pain Type Chronic pain   Aggravating Factors  getting on floor   Pain Relieving Factors pain medications            [facility] PT Assessment - 07/08/17 0001      Assessment  Medical Diagnosis Myastenia Gravis/ Chronic low back pain with radiculopathy   Referring Provider Dr. Manuella Ghazi   Onset Date/Surgical Date 03/05/92   Hand Dominance Left   Prior Therapy yes     Balance Screen   Has the patient fallen in the past 6 months Yes   How many times? --  3        See FCE report          Plan - 2017-07-17 0656    Clinical Impression Statement See FCE report.  Overall Level of Work: Falls within the Sedentary range.     Rehab Potential Fair   PT Frequency 1x / week   PT Treatment/Interventions ADLs/Self Care Home Management;Balance training;Therapeutic activities;Functional mobility training;Therapeutic exercise;Gait training;Stair training;Neuromuscular re-education   PT Next Visit Plan FCE only      Patient will benefit from skilled therapeutic intervention in order to improve the following deficits and  impairments:  Abnormal gait, Pain, Improper body mechanics, Impaired sensation, Postural dysfunction, Decreased mobility, Decreased activity tolerance, Decreased endurance, Decreased range of motion, Decreased strength, Hypomobility, Impaired flexibility, Difficulty walking, Decreased balance  Visit Diagnosis: Chronic bilateral low back pain with left-sided sciatica  Muscle weakness (generalized)  Difficulty in walking, not elsewhere classified  Abnormal posture  Neck pain      G-Codes - 07-17-2017 0658    Functional Assessment Tool Used (Outpatient Only) Clinical impression/ pain/ MODI/ LEFS/ difficulty walking   Functional Limitation Mobility: Walking and moving around   Mobility: Walking and Moving Around Current Status (F7510) At least 40 percent but less than 60 percent impaired, limited or restricted   Mobility: Walking and Moving Around Goal Status [phone]) At least 40 percent but less than 60 percent impaired, limited or restricted   Mobility: Walking and Moving Around Discharge Status [phone]) At least 40 percent but less than 60 percent impaired, limited or restricted       Problem List Patient Active Problem List   Diagnosis Date Noted  . Chest congestion 03/22/2017  . Ptosis of left eyelid 01/08/2017  . Dermatitis 12/24/2016  . Difficulty walking 04/27/2016  . Foot cramps 04/27/2016  . Annual physical exam 04/06/2016  . Obesity, Class II, BMI 35-39.9, with comorbidity 04/06/2016  . Special screening for malignant neoplasms, colon   . Benign neoplasm of sigmoid colon   . Benign neoplasm of descending colon   . Benign neoplasm of transverse colon   . Heartburn   . Gastritis   . Coronary artery disease involving native coronary artery with angina pectoris ([place]) 10/22/2015  . Asthma with acute exacerbation 10/08/2015  . Coronary artery disease 08/22/2015  . DM (diabetes mellitus), type 2, uncontrolled with complications ([place]) 23/53/6144  . Myasthenia gravis ([place])  08/22/2015  . Chronic left-sided low back pain 08/22/2015  . Hypertension 08/22/2015  . Asthma 08/22/2015  . GERD (gastroesophageal reflux disease) 08/22/2015  . Arthritis 08/22/2015  . Diabetic peripheral neuropathy ([place]) 08/22/2015  . Lyme disease 08/22/2015  . Hyperlipidemia 08/22/2015  . Insomnia 08/22/2015   Pura Spice, PT, DPT # [phone] 17-Jul-2017, 6:59 AM  [facility] [address] [place], [place], [address] Phone: [phone]   Fax:  [phone]  Name: [name] MRN: [id number] Date of Birth: Mar 12, 1961

## 2017-07-13 ENCOUNTER — Telehealth: Payer: Self-pay | Admitting: Family Medicine

## 2017-07-13 NOTE — Telephone Encounter (Signed)
PT NEEDING TO KNOW IF THE DR HAS SEEN THE FORM THAT WAS SENT ABOUT HIS DISABILITIES AND HE SEEN THE THERAPIST ABOUT HIS DISABILITIES AND THE PHYSICAL THERAPIST DID THEIR PART AND THEY WERE TO SEND THE FORM THAT DR [facility] HAS TO SIGN OFF ON ( THE FIRST PAGE).  HIS WIFE IS COMING IN TOMORROW AND HE IS WANTING TO KNOW IF THIS FORM COULD BE GIVEN TO HIS WIFE TOMORROW ( 07-13-17). PLEASE GIVE HIM A CALL.

## 2017-07-14 ENCOUNTER — Other Ambulatory Visit: Payer: Medicare Other

## 2017-07-14 ENCOUNTER — Ambulatory Visit
Admission: RE | Admit: 2017-07-14 | Discharge: 2017-07-14 | Disposition: A | Discharge status: Home / Self Care | Payer: Medicare Other | Source: Ambulatory Visit | Attending: Anesthesiology | Admitting: Anesthesiology

## 2017-07-14 ENCOUNTER — Encounter: Payer: Self-pay | Admitting: Anesthesiology

## 2017-07-14 ENCOUNTER — Ambulatory Visit (HOSPITAL_BASED_OUTPATIENT_CLINIC_OR_DEPARTMENT_OTHER): Payer: Medicare Other | Admitting: Anesthesiology

## 2017-07-14 ENCOUNTER — Other Ambulatory Visit: Payer: Self-pay | Admitting: Anesthesiology

## 2017-07-14 VITALS — BP 146/103 | HR 115 | Temp 97.8°F | Resp 17 | Ht 67.0 in | Wt 227.0 lb

## 2017-07-14 DIAGNOSIS — M542 Cervicalgia: Secondary | ICD-10-CM | POA: Insufficient documentation

## 2017-07-14 DIAGNOSIS — G894 Chronic pain syndrome: Secondary | ICD-10-CM | POA: Diagnosis not present

## 2017-07-14 DIAGNOSIS — Z794 Long term (current) use of insulin: Secondary | ICD-10-CM | POA: Diagnosis not present

## 2017-07-14 DIAGNOSIS — M5136 Other intervertebral disc degeneration, lumbar region: Secondary | ICD-10-CM | POA: Insufficient documentation

## 2017-07-14 DIAGNOSIS — M47817 Spondylosis without myelopathy or radiculopathy, lumbosacral region: Secondary | ICD-10-CM

## 2017-07-14 DIAGNOSIS — M549 Dorsalgia, unspecified: Secondary | ICD-10-CM | POA: Insufficient documentation

## 2017-07-14 DIAGNOSIS — M4697 Unspecified inflammatory spondylopathy, lumbosacral region: Secondary | ICD-10-CM

## 2017-07-14 DIAGNOSIS — Z7982 Long term (current) use of aspirin: Secondary | ICD-10-CM | POA: Diagnosis not present

## 2017-07-14 DIAGNOSIS — Z79891 Long term (current) use of opiate analgesic: Secondary | ICD-10-CM | POA: Diagnosis not present

## 2017-07-14 DIAGNOSIS — Z79899 Other long term (current) drug therapy: Secondary | ICD-10-CM | POA: Diagnosis not present

## 2017-07-14 DIAGNOSIS — R52 Pain, unspecified: Secondary | ICD-10-CM | POA: Diagnosis present

## 2017-07-14 DIAGNOSIS — F1190 Opioid use, unspecified, uncomplicated: Secondary | ICD-10-CM

## 2017-07-14 MED ORDER — CYCLOBENZAPRINE HCL 10 MG PO TABS: 10.0000 mg | ORAL_TABLET | Freq: Two times a day (BID) | ORAL | 5 refills | Status: DC

## 2017-07-14 MED ORDER — LIDOCAINE HCL (PF) 1 % IJ SOLN
5.0000 mL | Freq: Once | INTRAMUSCULAR | Status: AC
Start: 2017-07-14 — End: 2017-07-14
  Administered 2017-07-14: 5 mL via SUBCUTANEOUS

## 2017-07-14 MED ORDER — OXYCODONE HCL 10 MG PO TABS: 10.0000 mg | ORAL_TABLET | Freq: Four times a day (QID) | ORAL | 0 refills | Status: DC | PRN

## 2017-07-14 MED ORDER — ROPIVACAINE HCL 2 MG/ML IJ SOLN
INTRAMUSCULAR | Status: AC
  Filled 2017-07-14: qty 10

## 2017-07-14 MED ORDER — LIDOCAINE HCL (PF) 1 % IJ SOLN
INTRAMUSCULAR | Status: AC
  Filled 2017-07-14: qty 5

## 2017-07-14 MED ORDER — MIDAZOLAM HCL 5 MG/5ML IJ SOLN
INTRAMUSCULAR | Status: AC
  Filled 2017-07-14: qty 5

## 2017-07-14 MED ORDER — TRIAMCINOLONE ACETONIDE 40 MG/ML IJ SUSP
40.0000 mg | Freq: Once | INTRAMUSCULAR | Status: AC
  Administered 2017-07-14: 40 mg

## 2017-07-14 MED ORDER — MIDAZOLAM HCL 2 MG/2ML IJ SOLN: 5.0000 mg | Freq: Once | INTRAMUSCULAR | Status: DC

## 2017-07-14 MED ORDER — SODIUM CHLORIDE 0.9% FLUSH: 10.0000 mL | Freq: Once | INTRAVENOUS | Status: DC

## 2017-07-14 MED ORDER — LACTATED RINGERS IV SOLN: 1000.0000 mL | INTRAVENOUS | Status: DC

## 2017-07-14 MED ORDER — ROPIVACAINE HCL 2 MG/ML IJ SOLN
10.0000 mL | Freq: Once | INTRAMUSCULAR | Status: AC
  Administered 2017-07-14: 10 mL via EPIDURAL

## 2017-07-14 MED ORDER — TRIAMCINOLONE ACETONIDE 40 MG/ML IJ SUSP
INTRAMUSCULAR | Status: AC
  Filled 2017-07-14: qty 1

## 2017-07-14 NOTE — Telephone Encounter (Signed)
Have you seen this paperwork? Thanks. 

## 2017-07-14 NOTE — Patient Instructions (Addendum)
You have been given scripts for oxycodone x 2 today.  You have been sent Flexeril to your pharmacy.     ain Management Discharge Instructions  General Discharge Instructions :  If you need to reach your doctor call: Monday-Friday 8:00 am - 4:00 pm at [phone] or toll free [phone].  After clinic hours [phone] to have operator reach doctor.  Bring all of your medication bottles to all your appointments in the pain clinic.  To cancel or reschedule your appointment with Pain Management please remember to call 24 hours in advance to avoid a fee.  Refer to the educational materials which you have been given on: General Risks, I had my Procedure. Discharge Instructions, Post Sedation.  Post Procedure Instructions:  The drugs you were given will stay in your system until tomorrow, so for the next 24 hours you should not drive, make any legal decisions or drink any alcoholic beverages.  You may eat anything you prefer, but it is better to start with liquids then soups and crackers, and gradually work up to solid foods.  Please notify your doctor immediately if you have any unusual bleeding, trouble breathing or pain that is not related to your normal pain.  Depending on the type of procedure that was done, some parts of your body may feel week and/or numb.  This usually clears up by tonight or the next day.  Walk with the use of an assistive device or accompanied by an adult for the 24 hours.  You may use ice on the affected area for the first 24 hours.  Put ice in a Ziploc bag and cover with a towel and place against area 15 minutes on 15 minutes off.  You may switch to heat after 24 hours.GENERAL RISKS AND COMPLICATIONS  What are the risk, side effects and possible complications? Generally speaking, most procedures are safe.  However, with any procedure there are risks, side effects, and the possibility of complications.  The risks and complications are dependent upon the  sites that are lesioned, or the type of nerve block to be performed.  The closer the procedure is to the spine, the more serious the risks are.  Great care is taken when placing the radio frequency needles, block needles or lesioning probes, but sometimes complications can occur. 1. Infection: Any time there is an injection through the skin, there is a risk of infection.  This is why sterile conditions are used for these blocks.  There are four possible types of infection. 1. Localized skin infection. 2. Central Nervous System Infection-This can be in the form of Meningitis, which can be deadly. 3. Epidural Infections-This can be in the form of an epidural abscess, which can cause pressure inside of the spine, causing compression of the spinal cord with subsequent paralysis. This would require an emergency surgery to decompress, and there are no guarantees that the patient would recover from the paralysis. 4. Discitis-This is an infection of the intervertebral discs.  It occurs in about 1% of discography procedures.  It is difficult to treat and it may lead to surgery.        2. Pain: the needles have to go through skin and soft tissues, will cause soreness.       3. Damage to internal structures:  The nerves to be lesioned may be near blood vessels or    other nerves which can be potentially damaged.       4. Bleeding: Bleeding is more common if the  patient is taking blood thinners such as  aspirin, Coumadin, Ticiid, Plavix, etc., or if he/she have some genetic predisposition  such as hemophilia. Bleeding into the spinal canal can cause compression of the spinal  cord with subsequent paralysis.  This would require an emergency surgery to  decompress and there are no guarantees that the patient would recover from the  paralysis.       5. Pneumothorax:  Puncturing of a lung is a possibility, every time a needle is introduced in  the area of the chest or upper back.  Pneumothorax refers to free air around  the  collapsed lung(s), inside of the thoracic cavity (chest cavity).  Another two possible  complications related to a similar event would include: Hemothorax and Chylothorax.   These are variations of the Pneumothorax, where instead of air around the collapsed  lung(s), you may have blood or chyle, respectively.       6. Spinal headaches: They may occur with any procedures in the area of the spine.       7. Persistent CSF (Cerebro-Spinal Fluid) leakage: This is a rare problem, but may occur  with prolonged intrathecal or epidural catheters either due to the formation of a fistulous  track or a dural tear.       8. Nerve damage: By working so close to the spinal cord, there is always a possibility of  nerve damage, which could be as serious as a permanent spinal cord injury with  paralysis.       9. Death:  Although rare, severe deadly allergic reactions known as "Anaphylactic  reaction" can occur to any of the medications used.      10. Worsening of the symptoms:  We can always make thing worse.  What are the chances of something like this happening? Chances of any of this occuring are extremely low.  By statistics, you have more of a chance of getting killed in a motor vehicle accident: while driving to the hospital than any of the above occurring .  Nevertheless, you should be aware that they are possibilities.  In general, it is similar to taking a shower.  Everybody knows that you can slip, hit your head and get killed.  Does that mean that you should not shower again?  Nevertheless always keep in mind that statistics do not mean anything if you happen to be on the wrong side of them.  Even if a procedure has a 1 (one) in a 1,000,000 (million) chance of going wrong, it you happen to be that one..Also, keep in mind that by statistics, you have more of a chance of having something go wrong when taking medications.  Who should not have this procedure? If you are on a blood thinning medication (e.g.  Coumadin, Plavix, see list of "Blood Thinners"), or if you have an active infection going on, you should not have the procedure.  If you are taking any blood thinners, please inform your physician.  How should I prepare for this procedure?  Do not eat or drink anything at least six hours prior to the procedure.  Bring a driver with you .  It cannot be a taxi.  Come accompanied by an adult that can drive you back, and that is strong enough to help you if your legs get weak or numb from the local anesthetic.  Take all of your medicines the morning of the procedure with just enough water to swallow them.  If you have diabetes,  make sure that you are scheduled to have your procedure done first thing in the morning, whenever possible.  If you have diabetes, take only half of your insulin dose and notify our nurse that you have done so as soon as you arrive at the clinic.  If you are diabetic, but only take blood sugar pills (oral hypoglycemic), then do not take them on the morning of your procedure.  You may take them after you have had the procedure.  Do not take aspirin or any aspirin-containing medications, at least eleven (11) days prior to the procedure.  They may prolong bleeding.  Wear loose fitting clothing that may be easy to take off and that you would not mind if it got stained with Betadine or blood.  Do not wear any jewelry or perfume  Remove any nail coloring.  It will interfere with some of our monitoring equipment.  NOTE: Remember that this is not meant to be interpreted as a complete list of all possible complications.  Unforeseen problems may occur.  BLOOD THINNERS The following drugs contain aspirin or other products, which can cause increased bleeding during surgery and should not be taken for 2 weeks prior to and 1 week after surgery.  If you should need take something for relief of minor pain, you may take acetaminophen which is found in Tylenol,m Datril, Anacin-3 and  Panadol. It is not blood thinner. The products listed below are.  Do not take any of the products listed below in addition to any listed on your instruction sheet.  A.P.C or A.P.C with Codeine Codeine Phosphate Capsules #3 Ibuprofen Ridaura  ABC compound Congesprin Imuran rimadil  Advil Cope Indocin Robaxisal  Alka-Seltzer Effervescent Pain Reliever and Antacid Coricidin or Coricidin-D  Indomethacin Rufen  Alka-Seltzer plus Cold Medicine Cosprin Ketoprofen S-A-C Tablets  Anacin Analgesic Tablets or Capsules Coumadin Korlgesic Salflex  Anacin Extra Strength Analgesic tablets or capsules CP-2 Tablets Lanoril Salicylate  Anaprox Cuprimine Capsules Levenox Salocol  Anexsia-D Dalteparin Magan Salsalate  Anodynos Darvon compound Magnesium Salicylate Sine-off  Ansaid Dasin Capsules Magsal Sodium Salicylate  Anturane Depen Capsules Marnal Soma  APF Arthritis pain formula Dewitt's Pills Measurin Stanback  Argesic Dia-Gesic Meclofenamic Sulfinpyrazone  Arthritis Bayer Timed Release Aspirin Diclofenac Meclomen Sulindac  Arthritis pain formula Anacin Dicumarol Medipren Supac  Analgesic (Safety coated) Arthralgen Diffunasal Mefanamic Suprofen  Arthritis Strength Bufferin Dihydrocodeine Mepro Compound Suprol  Arthropan liquid Dopirydamole Methcarbomol with Aspirin Synalgos  ASA tablets/Enseals Disalcid Micrainin Tagament  Ascriptin Doan's Midol Talwin  Ascriptin A/D Dolene Mobidin Tanderil  Ascriptin Extra Strength Dolobid Moblgesic Ticlid  Ascriptin with Codeine Doloprin or Doloprin with Codeine Momentum Tolectin  Asperbuf Duoprin Mono-gesic Trendar  Aspergum Duradyne Motrin or Motrin IB Triminicin  Aspirin plain, buffered or enteric coated Durasal Myochrisine Trigesic  Aspirin Suppositories Easprin Nalfon Trillsate  Aspirin with Codeine Ecotrin Regular or Extra Strength Naprosyn Uracel  Atromid-S Efficin Naproxen Ursinus  Auranofin Capsules Elmiron Neocylate Vanquish  Axotal Emagrin Norgesic  Verin  Azathioprine Empirin or Empirin with Codeine Normiflo Vitamin E  Azolid Emprazil Nuprin Voltaren  Bayer Aspirin plain, buffered or children's or timed BC Tablets or powders Encaprin Orgaran Warfarin Sodium  Buff-a-Comp Enoxaparin Orudis Zorpin  Buff-a-Comp with Codeine Equegesic Os-Cal-Gesic   Buffaprin Excedrin plain, buffered or Extra Strength Oxalid   Bufferin Arthritis Strength Feldene Oxphenbutazone   Bufferin plain or Extra Strength Feldene Capsules Oxycodone with Aspirin   Bufferin with Codeine Fenoprofen Fenoprofen Pabalate or Pabalate-SF   Buffets II Flogesic Panagesic  Buffinol plain or Extra Strength Florinal or Florinal with Codeine Panwarfarin   Buf-Tabs Flurbiprofen Penicillamine   Butalbital Compound Four-way cold tablets Penicillin   Butazolidin Fragmin Pepto-Bismol   Carbenicillin Geminisyn Percodan   Carna Arthritis Reliever Geopen Persantine   Carprofen Gold's salt Persistin   Chloramphenicol Goody's Phenylbutazone   Chloromycetin Haltrain Piroxlcam   Clmetidine heparin Plaquenil   Cllnoril Hyco-pap Ponstel   Clofibrate Hydroxy chloroquine Propoxyphen         Before stopping any of these medications, be sure to consult the physician who ordered them.  Some, such as Coumadin (Warfarin) are ordered to prevent or treat serious conditions such as "deep thrombosis", "pumonary embolisms", and other heart problems.  The amount of time that you may need off of the medication may also vary with the medication and the reason for which you were taking it.  If you are taking any of these medications, please make sure you notify your pain physician before you undergo any procedures.         Facet Joint Block The facet joints connect the bones of the spine (vertebrae). They make it possible for you to bend, twist, and make other movements with your spine. They also keep you from bending too far, twisting too far, and making other excessive movements. A facet joint  block is a procedure where a numbing medicine (anesthetic) is injected into a facet joint. Often, a type of anti-inflammatory medicine called a steroid is also injected. A facet joint block may be done to diagnose neck or back pain. If the pain gets better after a facet joint block, it means the pain is probably coming from the facet joint. If the pain does not get better, it means the pain is probably not coming from the facet joint. A facet joint block may also be done to relieve neck or back pain caused by an inflamed facet joint. A facet joint block is only done to relieve pain if the pain does not improve with other methods, such as medicine, exercise programs, and physical therapy. Tell a health care provider about:  Any allergies you have.  All medicines you are taking, including vitamins, herbs, eye drops, creams, and over-the-counter medicines.  Any problems you or family members have had with anesthetic medicines.  Any blood disorders you have.  Any surgeries you have had.  Any medical conditions you have.  Whether you are pregnant or may be pregnant. What are the risks? Generally, this is a safe procedure. However, problems may occur, including:  Bleeding.  Injury to a nerve near the injection site.  Pain at the injection site.  Weakness or numbness in areas controlled by nerves near the injection site.  Infection.  Temporary fluid retention.  Allergic reactions to medicines or dyes.  Injury to other structures or organs near the injection site.  What happens before the procedure?  Follow instructions from your health care provider about eating or drinking restrictions.  Ask your health care provider about: ? Changing or stopping your regular medicines. This is especially important if you are taking diabetes medicines or blood thinners. ? Taking medicines such as aspirin and ibuprofen. These medicines can thin your blood. Do not take these medicines before your  procedure if your health care provider instructs you not to.  Do not take any new dietary supplements or medicines without asking your health care provider first.  Plan to have someone take you home after the procedure. What happens during the procedure?  You may need to remove your clothing and dress in an open-back gown.  The procedure will be done while you are lying on an X-ray table. You will most likely be asked to lie on your stomach, but you may be asked to lie in a different position if an injection will be made in your neck.  Machines will be used to monitor your oxygen levels, heart rate, and blood pressure.  If an injection will be made in your neck, an IV tube will be inserted into one of your veins. Fluids and medicine will flow directly into your body through the IV tube.  The area over the facet joint where the injection will be made will be cleaned with soap. The surrounding skin will be covered with clean drapes.  A numbing medicine (local anesthetic) will be applied to your skin. Your skin may sting or burn for a moment.  A video X-ray machine (fluoroscopy) will be used to locate the joint. In some cases, a CT scan may be used.  A contrast dye may be injected into the facet joint area to help locate the joint.  When the joint is located, an anesthetic will be injected into the joint through the needle.  Your health care provider will ask you whether you feel pain relief. If you do feel relief, a steroid may be injected to provide pain relief for a longer period of time. If you do not feel relief or feel only partial relief, additional injections of an anesthetic may be made in other facet joints.  The needle will be removed.  Your skin will be cleaned.  A bandage (dressing) will be applied over each injection site. The procedure may vary among health care providers and hospitals. What happens after the procedure?  You will be observed for 15-30 minutes before  being allowed to go home. This information is not intended to replace advice given to you by your health care provider. Make sure you discuss any questions you have with your health care provider. Document Released: 02/10/2007 Document Revised: 10/23/2015 Document Reviewed: 06/17/2015 Elsevier Interactive Patient Education  2018 [employer]. Post-procedure Information What to expect: Most procedures involve the use of a local anesthetic (numbing medicine), and a steroid (anti-inflammatory medicine).  The local anesthetics may cause temporary numbness and weakness of the legs or arms, depending on the location of the block. This numbness/weakness may last 4-6 hours, depending on the local anesthetic used. In rare instances, it can last up to 24 hours. While numb, you must be very careful not to injure the extremity.  After any procedure, you could expect the pain to get better within 15-20 minutes. This relief is temporary and may last 4-6 hours. Once the local anesthetics wears off, you could experience discomfort, possibly more than usual, for up to 10 (ten) days. In the case of radiofrequencies, it may last up to 6 weeks. Surgeries may take up to 8 weeks for the healing process. The discomfort is due to the irritation caused by needles going through skin and muscle. To minimize the discomfort, we recommend using ice the first day, and heat from then on. The ice should be applied for 15 minutes on, and 15 minutes off. Keep repeating this cycle until bedtime. Avoid applying the ice directly to the skin, to prevent frostbite. Heat should be used daily, until the pain improves (4-10 days). Be careful not to burn yourself.  Occasionally you may experience muscle spasms or cramps. These occur as  a consequence of the irritation caused by the needle sticks to the muscle and the blood that will inevitably be lost into the surrounding muscle tissue. Blood tends to be very irritating to tissues, which tend to  react by going into spasm. These spasms may start the same day of your procedure, but they may also take days to develop. This late onset type of spasm or cramp is usually caused by electrolyte imbalances triggered by the steroids, at the level of the kidney. Cramps and spasms tend to respond well to muscle relaxants, multivitamins (some are triggered by the procedure, but may have their origins in vitamin deficiencies), and "Gatorade", or any sports drinks that can replenish any electrolyte imbalances. (If you are a diabetic, ask your pharmacist to get you a sugar-free brand.) Warm showers or baths may also be helpful. Stretching exercises are highly recommended. General Instructions:  Be alert for signs of possible infection: redness, swelling, heat, red streaks, elevated temperature, and/or fever. These typically appear 4 to 6 days after the procedure. Immediately notify your doctor if you experience unusual bleeding, difficulty breathing, or loss of bowel or bladder control. If you experience increased pain, do not increase your pain medicine intake, unless instructed by your pain physician. Post-Procedure Care:  Be careful in moving about. Muscle spasms in the area of the injection may occur. Applying ice or heat to the area is often helpful. The incidence of spinal headaches after epidural injections ranges between 1.4% and 6%. If you develop a headache that does not seem to respond to conservative therapy, please let your physician know. This can be treated with an epidural blood patch.   Post-procedure numbness or redness is to be expected, however it should average 4 to 6 hours. If numbness and weakness of your extremities begins to develop 4 to 6 hours after your procedure, and is felt to be progressing and worsening, immediately contact your physician.   Diet:  If you experience nausea, do not eat until this sensation goes away. If you had a "Stellate Ganglion Block" for upper extremity "Reflex  Sympathetic Dystrophy", do not eat or drink until your hoarseness goes away. In any case, always start with liquids first and if you tolerate them well, then slowly progress to more solid foods. Activity:  For the first 4 to 6 hours after the procedure, use caution in moving about as you may experience numbness and/or weakness. Use caution in cooking, using household electrical appliances, and climbing steps. If you need to reach your Doctor call our office: [phone]) [phone] Monday-Thursday 8:00 am - 4:00 PM    Fridays: Closed     In case of an emergency: In case of emergency, call 911 or go to the nearest emergency room and have the physician there call us.  Interpretation of Procedure Every nerve block has two components: a diagnostic component, and a treatment component. Unrealistic expectations are the most common causes of "perceived failure".  In a perfect world, a single nerve block should be able to completely and permanently eliminate the pain. Sadly, the world is not perfect.  Most pain management nerve blocks are performed using local anesthetics and steroids. Steroids are responsible for any long-term benefit that you may experience. Their purpose is to decrease any chronic swelling that may exist in the area. Steroids begin to work immediately after being injected. However, most patients will not experience any benefits until 5 to 10 days after the injection, when the swelling has come down to the  point where they can tell a difference. Steroids will only help if there is swelling to be treated. As such, they can assist with the diagnosis. If effective, they suggest an inflammatory component to the pain, and if ineffective, they rule out inflammation as the main cause or component of the problem. If the problem is one of mechanical compression, you will get no benefit from those steroids.   In the case of local anesthetics, they have a crucial role in the diagnosis of your condition. Most  will begin to work within15 to 20 minutes after injection. The duration will depend on the type used (short- vs. Long-acting). It is of outmost importance that patients keep tract of their pain, after the procedure. To assist with this matter, a "Post-procedure Pain Diary" is provided. Make sure to complete it and to bring it back to your follow-up appointment.  As long as the patient keeps accurate, detailed records of their symptoms after every procedure, and returns to have those interpreted, every procedure will provide [place] with invaluable information. Even a block that does not provide the patient with any relief, will always provide [place] with information about the mechanism and the origin of the pain. The only time a nerve block can be considered a waste of time is when patients do not keep track of the results, or do not keep their post-procedure appointment.  Reporting the results back to your physician The Pain Score  Pain is a subjective complaint. It cannot be seen, touched, or measured. We depend entirely on the patient's report of the pain in order to assess your condition and treatment. To evaluate the pain, we use a pain scale, where "0" means "No Pain", and a "10" is "the worst possible pain that you can even imagine" (i.e. something like been eaten alive by a shark or being torn apart by a lion).   You will frequently be asked to rate your pain. Please be as accurate, remember that medical decisions will be based on your responses. Please do not rate your pain above a 10. Doing so is actually interpreted as "symptom magnification" (exaggeration), as well as lack of understanding with regards to the scale. To put this into perspective, when you tell us that your pain is at a 10 (ten), what you are saying is that there is nothing we can do to make this pain any worse. (Carefully think about that.)

## 2017-07-14 NOTE — Progress Notes (Signed)
Subjective:  Patient ID: [name], male    DOB: [birth year]  Age: 56 y.o. MRN: [id number]  CC: Back Pain (lower)   Procedure: Left L2-3 L3-4 L4-5 L5-S1 medial branch to the facet injection under fluoroscopic guidance without sedation   HPI [name] presents for reevaluation today. He continues to have intermittent pain that is recently intensified over the left lumbar region. He has some diffuse body pain as well for which he takes his oxycodone 10 mg tablets 4 times a day. He's been on this regimen for an extended period of time and continues to derive good lifestyle functional improvement with his medicines. Unfortunately his failed conservative therapy. He is a nonsurgical candidate and presents today for repeat lumbar facet block. He gets these periodically to keep his left lower back pain under control. He generally receives 75 to 80% improvement lasting 2-3 months. The quality characteristic condition region of his left lumbar pain are as previously documented and other stable pattern. We reviewed his narcotic assessment sheet he continues to drive good functional lifestyle. It with his medications.  Outpatient Medications Prior to Visit  Medication Sig Dispense Refill  . albuterol (PROVENTIL HFA;VENTOLIN HFA) 108 (90 Base) MCG/ACT inhaler Inhale 2 puffs into the lungs every 6 (six) hours as needed for wheezing or shortness of breath.    [name] albuterol (PROVENTIL) (2.5 MG/3ML) 0.083% nebulizer solution Take 2.5 mg by nebulization every 6 (six) hours as needed for wheezing or shortness of breath.    [name] aspirin 325 MG tablet Take 325 mg by mouth daily. AM    . carvedilol (COREG) 3.125 MG tablet Take 1 tablet (3.125 mg total) by mouth 2 (two) times daily. 60 tablet 5  . cyanocobalamin 1000 MCG tablet Take 1,000 mcg by mouth daily. am    . dexlansoprazole (DEXILANT) 60 MG capsule Take 1 capsule (60 mg total) by mouth daily. 30 capsule 6  . FLOVENT HFA 220 MCG/ACT inhaler Inhale 2 puffs  into the lungs 2 (two) times daily.  0  . gabapentin (NEURONTIN) 300 MG capsule Take 1 capsule (300 mg total) by mouth 3 (three) times daily. 90 capsule 1  . gabapentin (NEURONTIN) 800 MG tablet Take 1 tablet (800 mg total) by mouth at bedtime. 90 tablet 0  . glucose blood (BAYER CONTOUR TEST) test strip Use as instructed 100 each 2  . insulin lispro (HUMALOG KWIKPEN) 100 UNIT/ML KiwkPen Inject 8 Units into the skin 3 (three) times daily. Reported on 12/02/2015/ sliding scale    . LANTUS SOLOSTAR 100 UNIT/ML Solostar Pen Inject 36 Units into the skin daily at 10 pm.   0  . liraglutide (VICTOZA) 18 MG/3ML SOPN Inject 18 mg into the skin daily.     . Loratadine (CLARITIN PO) Take by mouth daily. am    . metFORMIN (GLUCOPHAGE) 1000 MG tablet Take 1,000 mg by mouth 2 (two) times daily with a meal.    . montelukast (SINGULAIR) 10 MG tablet every morning.   1  . naloxone (NARCAN) nasal spray 4 mg/0.1 mL For excess sedation from opioids 1 kit 2  . nitroGLYCERIN (NITROSTAT) 0.3 MG SL tablet Place 1 tablet (0.3 mg total) under the tongue every 5 (five) minutes as needed for chest pain. Reported on 12/02/2015 25 tablet 3  . pyridostigmine (MESTINON) 60 MG tablet Take 60 mg by mouth 4 (four) times daily.     [name] triamcinolone cream (KENALOG) 0.1 % apply to affected area twice a day UNTIL CLEAR  0  .  cyclobenzaprine (FLEXERIL) 10 MG tablet take 1 tablet by mouth three times a day if needed for muscle spasm 90 tablet 5  . Oxycodone HCl 10 MG TABS Take 1 tablet (10 mg total) by mouth 4 (four) times daily as needed. 120 tablet 0  . fluticasone-salmeterol (ADVAIR HFA) 230-21 MCG/ACT inhaler Inhale 1 puff into the lungs 2 (two) times daily. Reported on 01/09/2016    . rosuvastatin (CRESTOR) 5 MG tablet Take 2.5 mg by mouth daily at 6 PM.      Facility-Administered Medications Prior to Visit  Medication Dose Route Frequency Provider Last Rate Last Dose  . lidocaine (PF) (XYLOCAINE) 1 % injection 5 mL  5 mL Subcutaneous  Once Molli Barrows, MD      . lidocaine (PF) (XYLOCAINE) 1 % injection 5 mL  5 mL Subcutaneous Once Molli Barrows, MD      . lidocaine (PF) (XYLOCAINE) 1 % injection 5 mL  5 mL Subcutaneous Once Molli Barrows, MD      . lidocaine (PF) (XYLOCAINE) 1 % injection 5 mL  5 mL Subcutaneous Once Molli Barrows, MD      . midazolam (VERSED) injection 5 mg  5 mg Intravenous Once Molli Barrows, MD      . ropivacaine (PF) 2 mg/ml (0.2%) (NAROPIN) epidural 10 mL  10 mL Epidural Once Molli Barrows, MD      . ropivacaine (PF) 2 mg/ml (0.2%) (NAROPIN) epidural 10 mL  10 mL Epidural Once Molli Barrows, MD      . ropivacaine (PF) 2 mg/ml (0.2%) (NAROPIN) epidural 10 mL  10 mL Epidural Once Molli Barrows, MD      . ropivacaine (PF) 2 mg/ml (0.2%) (NAROPIN) epidural 10 mL  10 mL Epidural Once Molli Barrows, MD      . sodium chloride flush (NS) 0.9 % injection 10 mL  10 mL Other Once Molli Barrows, MD      . sodium chloride flush (NS) 0.9 % injection 10 mL  10 mL Other Once Molli Barrows, MD      . triamcinolone acetonide (KENALOG-40) injection 40 mg  40 mg Other Once Molli Barrows, MD      . triamcinolone acetonide (KENALOG-40) injection 40 mg  40 mg Other Once Molli Barrows, MD      . triamcinolone acetonide (KENALOG-40) injection 40 mg  40 mg Other Once Molli Barrows, MD      . triamcinolone acetonide (KENALOG-40) injection 40 mg  40 mg Other Once Molli Barrows, MD        Review of Systems CNS: No confusion or sedation GI: No abdominal pain or constipation Cardiac: No angina or palpitations  Objective:  BP (!) 146/103   Pulse (!) 115   Temp 97.8 F (36.6 C) (Oral)   Resp 17   Ht _0  (1.702 m)   Wt 227 lb (103 kg)   SpO2 100%   BMI 35.55 kg/m    BP Readings from Last 3 Encounters:  07/14/17 (!) 146/103  06/16/17 139/83  05/10/17 (!) 141/94     Wt Readings from Last 3 Encounters:  07/14/17 227 lb (103 kg)  06/16/17 233 lb (105.7 kg)  05/10/17 235 lb (106.6 kg)      Physical Exam Pt is alert and oriented PERRL EOMI HEART IS RRR no murmur or rub LCTA no wheezing or rhales MUSCULOSKELETALReveals left side paraspinous muscle tenderness and pain on extension with left  lateral rotation consistent with previous examination. This is diminished on the right side of his lower extremity muscle tone and bulk is at baseline.  Labs  Lab Results  Component Value Date   HGBA1C 8.1 08/22/2015   No results found for: GLUF, MICROALBUR, LDLCALC, CREATININE  -------------------------------------------------------------------------------------------------------------------- Lab Results  Component Value Date   ALT 16 12/02/2015   AST 16 12/02/2015   HGBA1C 8.1 08/22/2015    --------------------------------------------------------------------------------------------------------------------- Dg C-arm 1-60 Min-no Report  Result Date: 07/14/2017 Fluoroscopy was utilized by the requesting physician.  No radiographic interpretation.     Assessment & Plan:   Ramesses was seen today for back pain.  Diagnoses and all orders for this visit:  Cervicalgia  Facet arthritis of lumbosacral region ([place]) -     LUMBAR FACET(MEDIAL BRANCH NERVE BLOCK) MBNB -     ToxASSURE Select 13 (MW), Urine -     triamcinolone acetonide (KENALOG-40) injection 40 mg; 1 mL (40 mg total) by Other route once. -     sodium chloride flush (NS) 0.9 % injection 10 mL; 10 mLs by Other route once. -     ropivacaine (PF) 2 mg/mL (0.2%) (NAROPIN) injection 10 mL; 10 mLs by Epidural route once. -     midazolam (VERSED) injection 5 mg; Inject 5 mLs (5 mg total) into the vein once. -     lidocaine (PF) (XYLOCAINE) 1 % injection 5 mL; Inject 5 mLs into the skin once. -     lactated ringers infusion 1,000 mL; Inject 1,000 mLs into the vein continuous.  Chronic pain syndrome  Chronic, continuous use of opioids  DDD (degenerative disc disease), lumbar  Other orders -     Discontinue:  Oxycodone HCl 10 MG TABS; Take 1 tablet (10 mg total) by mouth 4 (four) times daily as needed. -     Oxycodone HCl 10 MG TABS; Take 1 tablet (10 mg total) by mouth 4 (four) times daily as needed. -     cyclobenzaprine (FLEXERIL) 10 MG tablet; Take 1 tablet (10 mg total) by mouth 2 (two) times daily.        ----------------------------------------------------------------------------------------------------------------------  Problem List Items Addressed This Visit    None    Visit Diagnoses    Cervicalgia    -  Primary   Facet arthritis of lumbosacral region ([facility])       Relevant Medications   Oxycodone HCl 10 MG TABS   cyclobenzaprine (FLEXERIL) 10 MG tablet   triamcinolone acetonide (KENALOG-40) injection 40 mg (Completed)   sodium chloride flush (NS) 0.9 % injection 10 mL   ropivacaine (PF) 2 mg/mL (0.2%) (NAROPIN) injection 10 mL (Completed)   midazolam (VERSED) injection 5 mg   lidocaine (PF) (XYLOCAINE) 1 % injection 5 mL (Completed)   lactated ringers infusion 1,000 mL   Other Relevant Orders   ToxASSURE Select 13 (MW), Urine   Chronic pain syndrome       Chronic, continuous use of opioids       DDD (degenerative disc disease), lumbar       Relevant Medications   Oxycodone HCl 10 MG TABS   cyclobenzaprine (FLEXERIL) 10 MG tablet   triamcinolone acetonide (KENALOG-40) injection 40 mg (Completed)        ----------------------------------------------------------------------------------------------------------------------  1. Facet arthritis of lumbosacral region [facility]) We'll proceed with a left therapeutic lumbar facet block today. The risks and benefits been reviewed with him in full detail all questions answered. We'll have him return to clinic in approximately 2 months for refill  of his medications with a repeat lumbar facet block sometime within the next 2-4 months. - LUMBAR FACET(MEDIAL BRANCH NERVE BLOCK) MBNB - ToxASSURE Select 13 (MW), Urine - triamcinolone  acetonide (KENALOG-40) injection 40 mg; 1 mL (40 mg total) by Other route once. - sodium chloride flush (NS) 0.9 % injection 10 mL; 10 mLs by Other route once. - ropivacaine (PF) 2 mg/mL (0.2%) (NAROPIN) injection 10 mL; 10 mLs by Epidural route once. - midazolam (VERSED) injection 5 mg; Inject 5 mLs (5 mg total) into the vein once. - lidocaine (PF) (XYLOCAINE) 1 % injection 5 mL; Inject 5 mLs into the skin once. - lactated ringers infusion 1,000 mL; Inject 1,000 mLs into the vein continuous.  2. Cervicalgia Continuous stretching strengthening exercises  3. Chronic pain syndrome We will refill his medications today and 4 months from today. We will review the [facility] practitioner database information and it is appropriate.  4. Chronic, continuous use of opioids As above  5. DDD (degenerative disc disease), lumbar As above    ----------------------------------------------------------------------------------------------------------------------  I have changed [name]'s cyclobenzaprine. I am also having him maintain his insulin lispro, pyridostigmine, Loratadine (CLARITIN PO), albuterol, glucose blood, gabapentin, gabapentin, rosuvastatin, metFORMIN, cyanocobalamin, aspirin, fluticasone-salmeterol, albuterol, montelukast, LANTUS SOLOSTAR, naloxone, liraglutide, nitroGLYCERIN, FLOVENT HFA, carvedilol, triamcinolone cream, dexlansoprazole, and Oxycodone HCl. We administered triamcinolone acetonide, ropivacaine (PF) 2 mg/mL (0.2%), and lidocaine (PF). We will continue to administer lidocaine (PF), midazolam, ropivacaine (PF) 2 mg/mL (0.2%), sodium chloride flush, triamcinolone acetonide, triamcinolone acetonide, sodium chloride flush, ropivacaine (PF) 2 mg/mL (0.2%), lidocaine (PF), triamcinolone acetonide, ropivacaine (PF) 2 mg/mL (0.2%), lidocaine (PF), triamcinolone acetonide, ropivacaine (PF) 2 mg/mL (0.2%), and lidocaine (PF).   Meds ordered this encounter  Medications  .  DISCONTD: Oxycodone HCl 10 MG TABS    Sig: Take 1 tablet (10 mg total) by mouth 4 (four) times daily as needed.    Dispense:  120 tablet    Refill:  0    Do not fill until [id number]  . Oxycodone HCl 10 MG TABS    Sig: Take 1 tablet (10 mg total) by mouth 4 (four) times daily as needed.    Dispense:  120 tablet    Refill:  0    Do not fill until [id number]  . cyclobenzaprine (FLEXERIL) 10 MG tablet    Sig: Take 1 tablet (10 mg total) by mouth 2 (two) times daily.    Dispense:  60 tablet    Refill:  5  . triamcinolone acetonide (KENALOG-40) injection 40 mg  . sodium chloride flush (NS) 0.9 % injection 10 mL  . ropivacaine (PF) 2 mg/mL (0.2%) (NAROPIN) injection 10 mL  . midazolam (VERSED) injection 5 mg  . lidocaine (PF) (XYLOCAINE) 1 % injection 5 mL  . lactated ringers infusion 1,000 mL   Patient's Medications  New Prescriptions   No medications on file  Previous Medications   ALBUTEROL (PROVENTIL HFA;VENTOLIN HFA) 108 (90 BASE) MCG/ACT INHALER    Inhale 2 puffs into the lungs every 6 (six) hours as needed for wheezing or shortness of breath.   ALBUTEROL (PROVENTIL) (2.5 MG/3ML) 0.083% NEBULIZER SOLUTION    Take 2.5 mg by nebulization every 6 (six) hours as needed for wheezing or shortness of breath.   ASPIRIN 325 MG TABLET    Take 325 mg by mouth daily. AM   CARVEDILOL (COREG) 3.125 MG TABLET    Take 1 tablet (3.125 mg total) by mouth 2 (two) times daily.   CYANOCOBALAMIN 1000 MCG TABLET  Take 1,000 mcg by mouth daily. am   DEXLANSOPRAZOLE (DEXILANT) 60 MG CAPSULE    Take 1 capsule (60 mg total) by mouth daily.   FLOVENT HFA 220 MCG/ACT INHALER    Inhale 2 puffs into the lungs 2 (two) times daily.   FLUTICASONE-SALMETEROL (ADVAIR HFA) 230-21 MCG/ACT INHALER    Inhale 1 puff into the lungs 2 (two) times daily. Reported on 01/09/2016   GABAPENTIN (NEURONTIN) 300 MG CAPSULE    Take 1 capsule (300 mg total) by mouth 3 (three) times daily.   GABAPENTIN (NEURONTIN) 800 MG TABLET    Take 1  tablet (800 mg total) by mouth at bedtime.   GLUCOSE BLOOD (BAYER CONTOUR TEST) TEST STRIP    Use as instructed   INSULIN LISPRO (HUMALOG KWIKPEN) 100 UNIT/ML KIWKPEN    Inject 8 Units into the skin 3 (three) times daily. Reported on 12/02/2015/ sliding scale   LANTUS SOLOSTAR 100 UNIT/ML SOLOSTAR PEN    Inject 36 Units into the skin daily at 10 pm.    LIRAGLUTIDE (VICTOZA) 18 MG/3ML SOPN    Inject 18 mg into the skin daily.    LORATADINE (CLARITIN PO)    Take by mouth daily. am   METFORMIN (GLUCOPHAGE) 1000 MG TABLET    Take 1,000 mg by mouth 2 (two) times daily with a meal.   MONTELUKAST (SINGULAIR) 10 MG TABLET    every morning.    NALOXONE (NARCAN) NASAL SPRAY 4 MG/0.1 ML    For excess sedation from opioids   NITROGLYCERIN (NITROSTAT) 0.3 MG SL TABLET    Place 1 tablet (0.3 mg total) under the tongue every 5 (five) minutes as needed for chest pain. Reported on 12/02/2015   PYRIDOSTIGMINE (MESTINON) 60 MG TABLET    Take 60 mg by mouth 4 (four) times daily.    ROSUVASTATIN (CRESTOR) 5 MG TABLET    Take 2.5 mg by mouth daily at 6 PM.    TRIAMCINOLONE CREAM (KENALOG) 0.1 %    apply to affected area twice a day UNTIL CLEAR  Modified Medications   Modified Medication Previous Medication   CYCLOBENZAPRINE (FLEXERIL) 10 MG TABLET cyclobenzaprine (FLEXERIL) 10 MG tablet      Take 1 tablet (10 mg total) by mouth 2 (two) times daily.    take 1 tablet by mouth three times a day if needed for muscle spasm   OXYCODONE HCL 10 MG TABS Oxycodone HCl 10 MG TABS      Take 1 tablet (10 mg total) by mouth 4 (four) times daily as needed.    Take 1 tablet (10 mg total) by mouth 4 (four) times daily as needed.  Discontinued Medications   No medications on file   ---------------------------------------------------------------------------------------------------------------------- Left lumbar facet block at L2-3 L3-4 L4-5 and L5-S1 under fluoroscopic guidance without sedation   Patient was taken to the fluoroscopy  suite and placed in prone position. A total dose of 0 mg of Versed with 0 cc of fentanyl were titrated for moderate sedation. Vital signs were stable throughout the procedure. The  overlying area of skin on the  left side was prepped with Betadine 3 and strict aseptic technique was utilized throughout the procedure. Flouroscopy was used to I identified the areas overlying the aforementioned facets at L2-3 L3-4 L4-5 and L5-S1.   1% lidocaine 1 cc was infiltrated subcutaneously and into the fascia with a 25-gauge needle at each of these sites. I then advanced a 22-gauge 3-1/2 inch Quinckie needle with the needle tip to  lie at the "[facility]" portion of the "" Scotty dog". There was negative aspiration for heme or CSF and  no paresthesia. I then injected 2 cc of ropivacaine 0.2% mixed with10 mg of triamcinolone at each of the aforementioned sites. These needles were withdrawn.   The patient was convalesced discharged home stable condition for follow-up as mentioned. Follow-up: Return for evaluation, med refill.    Molli Barrows, MD

## 2017-07-14 NOTE — Progress Notes (Signed)
Safety precautions to be maintained throughout the outpatient stay will include: orient to surroundings, keep bed in low position, maintain call bell within reach at all times, provide assistance with transfer out of bed and ambulation.  

## 2017-07-14 NOTE — Progress Notes (Signed)
Nursing Pain Medication Assessment:  Safety precautions to be maintained throughout the outpatient stay will include: orient to surroundings, keep bed in low position, maintain call bell within reach at all times, provide assistance with transfer out of bed and ambulation.  Medication Inspection Compliance: Pill count conducted under aseptic conditions, in front of the patient. Neither the pills nor the bottle was removed from the patient's sight at any time. Once count was completed pills were immediately returned to the patient in their original bottle.  Medication: Oxycodone IR Pill/Patch Count: 52 of 120 pills remain Pill/Patch Appearance: Markings consistent with prescribed medication Bottle Appearance: Standard pharmacy container. Clearly labeled. Filled Date: 09/22/ 2018 Last Medication intake:  Today

## 2017-07-14 NOTE — Telephone Encounter (Signed)
I have not seen the paperwork regarding this patient's disability.

## 2017-07-15 ENCOUNTER — Telehealth: Payer: Self-pay | Admitting: *Deleted

## 2017-07-15 NOTE — Telephone Encounter (Signed)
Left voicemail for patient to return call if needed for any post procedure issues.

## 2017-07-16 NOTE — Telephone Encounter (Signed)
Called pt informed him that paperwork could not be found, asked pt to please bring by another copy or have another copy faxed.

## 2017-07-19 ENCOUNTER — Telehealth: Payer: Self-pay

## 2017-07-19 NOTE — Telephone Encounter (Signed)
[name] states she faxed over some papers for Dr. Manuella Ghazi to look over regarding pt rehab and also some information regarding his insurance [employer]. Looked in the chart and  saw they were never scan to the chart therefore asked debbie to fax over the forms putting attn [name] Dr. Manuella Ghazi can review it.

## 2017-07-20 LAB — TOXASSURE SELECT 13 (MW), URINE

## 2017-07-26 ENCOUNTER — Telehealth: Payer: Self-pay

## 2017-07-26 NOTE — Telephone Encounter (Signed)
Form for disability has been received and put on Dr. Manuella Ghazi desk to be reviewed.

## 2017-07-27 ENCOUNTER — Telehealth: Payer: Self-pay

## 2017-07-27 NOTE — Telephone Encounter (Signed)
Received paper work regarding disability form. Dr. Manuella Ghazi is asking the pt to have his pain management Doctor to fill out the form instead. Left a message  To ask the pt to pick up the forms. The forms will be up front.

## 2017-07-28 ENCOUNTER — Telehealth: Payer: Self-pay | Admitting: Anesthesiology

## 2017-07-28 NOTE — Telephone Encounter (Signed)
Voicemail left to let patient know that we do not work with disability papers. If we can be of any other help to please let us know.

## 2017-07-28 NOTE — Telephone Encounter (Signed)
Patient called asking about disability paperwork. His PCP says he thinks Dr. [name] would know more about this. Patient is out of town, he would like to speak with someone about this. Please call him on cell phone or wife's phone

## 2017-07-29 ENCOUNTER — Telehealth: Payer: Self-pay

## 2017-07-29 NOTE — Telephone Encounter (Signed)
Copied from [place] [id number]. Topic: Inquiry >> Jul 28, 2017 11:31 AM Ether Griffins B wrote: Reason for CRM: pt would like his disability paperwork faxed to Dr. Baruch Merl office his pain specialist and put the original in the mail to his home address. He is out of town until next Wednesday and would appreciate any help in sending the forms where they need to go.

## 2017-07-29 NOTE — Telephone Encounter (Signed)
Faxed the paperwork to Dr. [name] office at [facility] and mailed out the original forms to his address.

## 2017-08-12 DIAGNOSIS — E785 Hyperlipidemia, unspecified: Secondary | ICD-10-CM

## 2017-08-12 DIAGNOSIS — E1169 Type 2 diabetes mellitus with other specified complication: Secondary | ICD-10-CM | POA: Insufficient documentation

## 2017-08-12 DIAGNOSIS — E559 Vitamin D deficiency, unspecified: Secondary | ICD-10-CM | POA: Insufficient documentation

## 2017-09-20 ENCOUNTER — Encounter: Payer: Self-pay | Admitting: Anesthesiology

## 2017-09-20 ENCOUNTER — Ambulatory Visit: Payer: Medicare Other | Attending: Anesthesiology | Admitting: Anesthesiology

## 2017-09-20 ENCOUNTER — Other Ambulatory Visit: Payer: Self-pay

## 2017-09-20 VITALS — BP 133/91 | HR 98 | Temp 98.0°F | Resp 16 | Ht 67.0 in | Wt 225.0 lb

## 2017-09-20 DIAGNOSIS — M4687 Other specified inflammatory spondylopathies, lumbosacral region: Secondary | ICD-10-CM | POA: Diagnosis not present

## 2017-09-20 DIAGNOSIS — A6923 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: Secondary | ICD-10-CM | POA: Diagnosis not present

## 2017-09-20 DIAGNOSIS — F1190 Opioid use, unspecified, uncomplicated: Secondary | ICD-10-CM | POA: Diagnosis not present

## 2017-09-20 DIAGNOSIS — M5136 Other intervertebral disc degeneration, lumbar region: Secondary | ICD-10-CM

## 2017-09-20 DIAGNOSIS — Z794 Long term (current) use of insulin: Secondary | ICD-10-CM | POA: Insufficient documentation

## 2017-09-20 DIAGNOSIS — G7000 Myasthenia gravis without (acute) exacerbation: Secondary | ICD-10-CM | POA: Diagnosis not present

## 2017-09-20 DIAGNOSIS — M542 Cervicalgia: Secondary | ICD-10-CM | POA: Insufficient documentation

## 2017-09-20 DIAGNOSIS — Z79891 Long term (current) use of opiate analgesic: Secondary | ICD-10-CM | POA: Insufficient documentation

## 2017-09-20 DIAGNOSIS — M47817 Spondylosis without myelopathy or radiculopathy, lumbosacral region: Secondary | ICD-10-CM

## 2017-09-20 DIAGNOSIS — M4697 Unspecified inflammatory spondylopathy, lumbosacral region: Secondary | ICD-10-CM | POA: Diagnosis not present

## 2017-09-20 DIAGNOSIS — G894 Chronic pain syndrome: Secondary | ICD-10-CM

## 2017-09-20 DIAGNOSIS — M5431 Sciatica, right side: Secondary | ICD-10-CM | POA: Diagnosis not present

## 2017-09-20 DIAGNOSIS — Z7982 Long term (current) use of aspirin: Secondary | ICD-10-CM | POA: Insufficient documentation

## 2017-09-20 DIAGNOSIS — Z79899 Other long term (current) drug therapy: Secondary | ICD-10-CM | POA: Insufficient documentation

## 2017-09-20 MED ORDER — OXYCODONE HCL 10 MG PO TABS: 10.0000 mg | ORAL_TABLET | Freq: Four times a day (QID) | ORAL | 0 refills | Status: DC | PRN

## 2017-09-20 NOTE — Progress Notes (Signed)
Nursing Pain Medication Assessment:  Safety precautions to be maintained throughout the outpatient stay will include: orient to surroundings, keep bed in low position, maintain call bell within reach at all times, provide assistance with transfer out of bed and ambulation.  Medication Inspection Compliance: Pill count conducted under aseptic conditions, in front of the patient. Neither the pills nor the bottle was removed from the patient's sight at any time. Once count was completed pills were immediately returned to the patient in their original bottle.  Medication: Oxycodone IR Pill/Patch Count: 16 of 120 pills remain Pill/Patch Appearance: Markings consistent with prescribed medication Bottle Appearance: Standard pharmacy container. Clearly labeled. Filled Date: 11 /21 / 2018 Last Medication intake:  Today

## 2017-09-20 NOTE — Progress Notes (Signed)
Subjective:  Patient ID: [name], male    DOB: 03/03/1961  Age: 56 y.o. MRN: [id number]  CC: Back Pain (lower left side) and Neck Pain   Procedure: None  HPI [name] presents for reevaluation.  He was last seen 2 months ago and has been doing well.  In October he had a left side facet block and he feels that his pain has subsided considerably with a 75% improvement overall with his left lower back pain.  He still has some more generalized pain affecting his neck and low back and right leg and calf.  He is taking his medications as prescribed and based on his narcotic assessment sheet he seems to continue to derive good functional lifestyle improvement with his medications.  No change in lower extremity strength and function her bowel bladder function noted at this time.  Outpatient Medications Prior to Visit  Medication Sig Dispense Refill  . albuterol (PROVENTIL HFA;VENTOLIN HFA) 108 (90 Base) MCG/ACT inhaler Inhale 2 puffs into the lungs every 6 (six) hours as needed for wheezing or shortness of breath.    [name] albuterol (PROVENTIL) (2.5 MG/3ML) 0.083% nebulizer solution Take 2.5 mg by nebulization every 6 (six) hours as needed for wheezing or shortness of breath.    [name] aspirin 325 MG tablet Take 325 mg by mouth daily. AM    . carvedilol (COREG) 3.125 MG tablet Take 1 tablet (3.125 mg total) by mouth 2 (two) times daily. 60 tablet 5  . cyclobenzaprine (FLEXERIL) 10 MG tablet Take 1 tablet (10 mg total) by mouth 2 (two) times daily. 60 tablet 5  . dexlansoprazole (DEXILANT) 60 MG capsule Take 1 capsule (60 mg total) by mouth daily. 30 capsule 6  . FLOVENT HFA 220 MCG/ACT inhaler Inhale 2 puffs into the lungs 2 (two) times daily.  0  . fluticasone-salmeterol (ADVAIR HFA) 230-21 MCG/ACT inhaler Inhale 1 puff into the lungs 2 (two) times daily. Reported on 01/09/2016    . gabapentin (NEURONTIN) 300 MG capsule Take 1 capsule (300 mg total) by mouth 3 (three) times daily. 90 capsule 1  .  gabapentin (NEURONTIN) 800 MG tablet Take 1 tablet (800 mg total) by mouth at bedtime. 90 tablet 0  . glucose blood (BAYER CONTOUR TEST) test strip Use as instructed 100 each 2  . insulin lispro (HUMALOG KWIKPEN) 100 UNIT/ML KiwkPen Inject 8 Units into the skin 3 (three) times daily. Reported on 12/02/2015/ sliding scale    . LANTUS SOLOSTAR 100 UNIT/ML Solostar Pen Inject 36 Units into the skin daily at 10 pm.   0  . liraglutide (VICTOZA) 18 MG/3ML SOPN Inject 18 mg into the skin daily.     . Loratadine (CLARITIN PO) Take by mouth daily. am    . metFORMIN (GLUCOPHAGE) 1000 MG tablet Take 1,000 mg by mouth 2 (two) times daily with a meal.    . montelukast (SINGULAIR) 10 MG tablet every morning.   1  . naloxone (NARCAN) nasal spray 4 mg/0.1 mL For excess sedation from opioids 1 kit 2  . nitroGLYCERIN (NITROSTAT) 0.3 MG SL tablet Place 1 tablet (0.3 mg total) under the tongue every 5 (five) minutes as needed for chest pain. Reported on 12/02/2015 25 tablet 3  . pyridostigmine (MESTINON) 60 MG tablet Take 60 mg by mouth 4 (four) times daily.     . Oxycodone HCl 10 MG TABS Take 1 tablet (10 mg total) by mouth 4 (four) times daily as needed. 120 tablet 0  . cyanocobalamin 1000  MCG tablet Take 1,000 mcg by mouth daily. am    . rosuvastatin (CRESTOR) 5 MG tablet Take 2.5 mg by mouth daily at 6 PM.     . triamcinolone cream (KENALOG) 0.1 % apply to affected area twice a day UNTIL CLEAR  0   Facility-Administered Medications Prior to Visit  Medication Dose Route Frequency Provider Last Rate Last Dose  . lidocaine (PF) (XYLOCAINE) 1 % injection 5 mL  5 mL Subcutaneous Once Molli Barrows, MD      . lidocaine (PF) (XYLOCAINE) 1 % injection 5 mL  5 mL Subcutaneous Once Molli Barrows, MD      . lidocaine (PF) (XYLOCAINE) 1 % injection 5 mL  5 mL Subcutaneous Once Molli Barrows, MD      . lidocaine (PF) (XYLOCAINE) 1 % injection 5 mL  5 mL Subcutaneous Once Molli Barrows, MD      . midazolam (VERSED)  injection 5 mg  5 mg Intravenous Once Molli Barrows, MD      . ropivacaine (PF) 2 mg/ml (0.2%) (NAROPIN) epidural 10 mL  10 mL Epidural Once Molli Barrows, MD      . ropivacaine (PF) 2 mg/ml (0.2%) (NAROPIN) epidural 10 mL  10 mL Epidural Once Molli Barrows, MD      . ropivacaine (PF) 2 mg/ml (0.2%) (NAROPIN) epidural 10 mL  10 mL Epidural Once Molli Barrows, MD      . ropivacaine (PF) 2 mg/ml (0.2%) (NAROPIN) epidural 10 mL  10 mL Epidural Once Molli Barrows, MD      . sodium chloride flush (NS) 0.9 % injection 10 mL  10 mL Other Once Molli Barrows, MD      . sodium chloride flush (NS) 0.9 % injection 10 mL  10 mL Other Once Molli Barrows, MD      . triamcinolone acetonide (KENALOG-40) injection 40 mg  40 mg Other Once Molli Barrows, MD      . triamcinolone acetonide (KENALOG-40) injection 40 mg  40 mg Other Once Molli Barrows, MD      . triamcinolone acetonide (KENALOG-40) injection 40 mg  40 mg Other Once Molli Barrows, MD      . triamcinolone acetonide (KENALOG-40) injection 40 mg  40 mg Other Once Molli Barrows, MD        Review of Systems CNS: No confusion or sedation Cardiac: No angina or palpitations GI: No constipation or abdominal pain  Objective:  BP (!) 133/91 (BP Location: Right Arm, Patient Position: Sitting, Cuff Size: Normal)   Pulse 98   Temp 98 F (36.7 C) (Oral)   Resp 16   Ht _0  (1.702 m)   Wt 225 lb (102.1 kg)   SpO2 99%   BMI 35.24 kg/m    BP Readings from Last 3 Encounters:  09/20/17 (!) 133/91  07/14/17 (!) 146/103  06/16/17 139/83     Wt Readings from Last 3 Encounters:  09/20/17 225 lb (102.1 kg)  07/14/17 227 lb (103 kg)  06/16/17 233 lb (105.7 kg)     Physical Exam Pt is alert and oriented PERRL EOMI HEART IS RRR no murmur or rub LCTA no wheezing or rales MUSCULOSKELETAL reveals some persistent paraspinous muscle tenderness but no overt trigger points.  He is ambulating with an antalgic gait.  His strength appears to be at  baseline.  Otherwise he is in his usual state of health at this time.  Labs  Lab Results  Component Value Date   HGBA1C 8.1 08/22/2015   No results found for: GLUF, MICROALBUR, LDLCALC, CREATININE  -------------------------------------------------------------------------------------------------------------------- Lab Results  Component Value Date   ALT 16 12/02/2015   AST 16 12/02/2015   HGBA1C 8.1 08/22/2015    --------------------------------------------------------------------------------------------------------------------- Dg C-arm 1-60 Min-no Report  Result Date: 07/14/2017 Fluoroscopy was utilized by the requesting physician.  No radiographic interpretation.     Assessment & Plan:   [name] was seen today for back pain and neck pain.  Diagnoses and all orders for this visit:  Facet arthritis of lumbosacral region [facility])  Cervicalgia  Chronic pain syndrome  Chronic, continuous use of opioids  DDD (degenerative disc disease), lumbar  Sciatica of right side  Lyme arthritis of multiple joints (HCC)  Myasthenia gravis ([place])  Other orders -     Discontinue: Oxycodone HCl 10 MG TABS; Take 1 tablet (10 mg total) by mouth 4 (four) times daily as needed. -     Oxycodone HCl 10 MG TABS; Take 1 tablet (10 mg total) by mouth 4 (four) times daily as needed.        ----------------------------------------------------------------------------------------------------------------------  Problem List Items Addressed This Visit      Unprioritized   Myasthenia gravis ([place])    Other Visit Diagnoses    Facet arthritis of lumbosacral region [facility])    -  Primary   Relevant Medications   Oxycodone HCl 10 MG TABS   Cervicalgia       Chronic pain syndrome       Chronic, continuous use of opioids       DDD (degenerative disc disease), lumbar       Relevant Medications   Oxycodone HCl 10 MG TABS   Sciatica of right side       Lyme arthritis of multiple joints (HCC)        Relevant Medications   Oxycodone HCl 10 MG TABS        ----------------------------------------------------------------------------------------------------------------------  1. Facet arthritis of lumbosacral region [facility]) We will defer on repeat injection at this point and have him return to clinic in 2 months.  I am going to schedule him for a repeat facet injection in the winter depending on how he responds to his last injection.  He seems to continue to derive good functional benefit so far.  We also talked about options for a radiofrequency ablation however he does not want to pursue this secondary to the neurolytic effect of the procedure.  He is continue to do his stretching strengthening exercises and getting good relief with his medications as well.  2. Cervicalgia Continue with stretching strengthening exercises  3. Chronic pain syndrome   4. Chronic, continuous use of opioids We have gone over the [facility] practitioner database information and it is appropriate.  He is to return to clinic in 2 months with refills given today for December 21 and January 20 for his oxycodone 10 mg tablets.  5. DDD (degenerative disc disease), lumbar   6. Sciatica of right side   7. Lyme arthritis of multiple joints ([place])   8. Myasthenia gravis ([place])     ----------------------------------------------------------------------------------------------------------------------  I am having Tilda Burrow maintain his insulin lispro, pyridostigmine, Loratadine (CLARITIN PO), albuterol, glucose blood, gabapentin, gabapentin, rosuvastatin, metFORMIN, cyanocobalamin, aspirin, fluticasone-salmeterol, albuterol, montelukast, LANTUS SOLOSTAR, naloxone, liraglutide, nitroGLYCERIN, FLOVENT HFA, carvedilol, triamcinolone cream, dexlansoprazole, cyclobenzaprine, and Oxycodone HCl. We will continue to administer lidocaine (PF), midazolam, ropivacaine (PF) 2 mg/mL (0.2%), sodium chloride flush,  triamcinolone acetonide, triamcinolone  acetonide, sodium chloride flush, ropivacaine (PF) 2 mg/mL (0.2%), lidocaine (PF), triamcinolone acetonide, ropivacaine (PF) 2 mg/mL (0.2%), lidocaine (PF), triamcinolone acetonide, ropivacaine (PF) 2 mg/mL (0.2%), and lidocaine (PF).   Meds ordered this encounter  Medications  . DISCONTD: Oxycodone HCl 10 MG TABS    Sig: Take 1 tablet (10 mg total) by mouth 4 (four) times daily as needed.    Dispense:  120 tablet    Refill:  0    Do not fill until [id number]  . Oxycodone HCl 10 MG TABS    Sig: Take 1 tablet (10 mg total) by mouth 4 (four) times daily as needed.    Dispense:  120 tablet    Refill:  0    Do not fill until [id number]     Medication List        Accurate as of 09/20/17  4:47 PM. Always use your most recent med list.          * albuterol (2.5 MG/3ML) 0.083% nebulizer solution Commonly known as:  PROVENTIL   * albuterol 108 (90 Base) MCG/ACT inhaler Commonly known as:  PROVENTIL HFA;VENTOLIN HFA   aspirin 325 MG tablet   carvedilol 3.125 MG tablet Commonly known as:  COREG Take 1 tablet (3.125 mg total) by mouth 2 (two) times daily.   CLARITIN PO   cyanocobalamin 1000 MCG tablet   cyclobenzaprine 10 MG tablet Commonly known as:  FLEXERIL Take 1 tablet (10 mg total) by mouth 2 (two) times daily.   dexlansoprazole 60 MG capsule Commonly known as:  DEXILANT Take 1 capsule (60 mg total) by mouth daily.   FLOVENT HFA 220 MCG/ACT inhaler Generic drug:  fluticasone   fluticasone-salmeterol 230-21 MCG/ACT inhaler Commonly known as:  ADVAIR HFA   * gabapentin 300 MG capsule Commonly known as:  NEURONTIN Take 1 capsule (300 mg total) by mouth 3 (three) times daily.   * gabapentin 800 MG tablet Commonly known as:  NEURONTIN Take 1 tablet (800 mg total) by mouth at bedtime.   glucose blood test strip Commonly known as:  BAYER CONTOUR TEST Use as instructed   HUMALOG KWIKPEN 100 UNIT/ML KiwkPen Generic drug:  insulin  lispro   LANTUS SOLOSTAR 100 UNIT/ML Solostar Pen Generic drug:  Insulin Glargine   metFORMIN 1000 MG tablet Commonly known as:  GLUCOPHAGE   montelukast 10 MG tablet Commonly known as:  SINGULAIR   naloxone 4 MG/0.1ML Liqd nasal spray kit Commonly known as:  NARCAN For excess sedation from opioids   nitroGLYCERIN 0.3 MG SL tablet Commonly known as:  NITROSTAT Place 1 tablet (0.3 mg total) under the tongue every 5 (five) minutes as needed for chest pain. Reported on 12/02/2015   Oxycodone HCl 10 MG Tabs Take 1 tablet (10 mg total) by mouth 4 (four) times daily as needed.   pyridostigmine 60 MG tablet Commonly known as:  MESTINON   rosuvastatin 5 MG tablet Commonly known as:  CRESTOR   triamcinolone cream 0.1 % Commonly known as:  KENALOG   VICTOZA 18 MG/3ML Sopn Generic drug:  liraglutide      * This list has 4 medication(s) that are the same as other medications prescribed for you. Read the directions carefully, and ask your doctor or other care provider to review them with you.          Where to Get Your Medications    You can get these medications from any pharmacy   Bring a paper prescription for each of these medications  Oxycodone HCl 10 MG Tabs    ----------------------------------------------------------------------------------------------------------------------  Follow-up: Return in about 2 months (around 11/21/2017) for evaluation, med refill.    Molli Barrows, MD

## 2017-10-07 ENCOUNTER — Encounter: Payer: Self-pay | Admitting: Family Medicine

## 2017-10-07 ENCOUNTER — Ambulatory Visit: Payer: Medicare Other | Admitting: Family Medicine

## 2017-10-07 VITALS — BP 116/74 | HR 106 | Temp 98.2°F | Resp 14 | Wt 232.1 lb

## 2017-10-07 DIAGNOSIS — J029 Acute pharyngitis, unspecified: Secondary | ICD-10-CM | POA: Diagnosis not present

## 2017-10-07 DIAGNOSIS — J0100 Acute maxillary sinusitis, unspecified: Secondary | ICD-10-CM | POA: Diagnosis not present

## 2017-10-07 DIAGNOSIS — J4541 Moderate persistent asthma with (acute) exacerbation: Secondary | ICD-10-CM | POA: Diagnosis not present

## 2017-10-07 DIAGNOSIS — H9203 Otalgia, bilateral: Secondary | ICD-10-CM | POA: Diagnosis not present

## 2017-10-07 LAB — POCT RAPID STREP A (OFFICE): Rapid Strep A Screen: NEGATIVE

## 2017-10-07 MED ORDER — AZITHROMYCIN 250 MG PO TABS: ORAL_TABLET | ORAL | 0 refills | Status: DC

## 2017-10-07 MED ORDER — PREDNISONE 10 MG (21) PO TBPK: ORAL_TABLET | ORAL | 0 refills | Status: DC

## 2017-10-07 NOTE — Progress Notes (Signed)
Name: [name]   MRN: [id number]    DOB: 22-Aug-1961   Date:10/07/2017       Progress Note  Subjective  Chief Complaint  Chief Complaint  Patient presents with  . Ear Pain    both ears feel full   . Sore Throat    mild; 4-5 days   . Fatigue    body aches and chills; 4-5days  . Dizziness    little; 4-5days    Otalgia   There is pain in both ears. This is a new problem. The current episode started in the past 7 days (5 days). Maximum temperature: a few nights ago, he felt freezing even with heat turned to 73-74. Associated symptoms include a sore throat. Pertinent negatives include no ear discharge (has noticed a wax-like discharge from the ears. ) or neck pain. He has tried nothing (has taken Aspirin for body aches, nasal spray for clogged ears. ) for the symptoms.     Past Medical History:  Diagnosis Date  . Allergy    dust, seasonal (worse in the fall).  . Anginal pain (HCC)    1 OR 2 x IN 3 MONTHS  . 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 Followed by Pain Specialist in [place], back and neck  . Asthma    BRONCHITIS  . Cataract    First Dx in 2012  . Coronary artery disease    Chronic ischemic heart disease with multiple stents placed in the LAD and right coronary artery. He had prior anterior myocardial infarction. All done in [place].  . Diabetes mellitus without complication ([place])    TYPE 2  . Diabetic peripheral neuropathy (HCC)    feet and hands  . GERD (gastroesophageal reflux disease)   . Headache    muscle tension  . Hyperlipidemia   . Hypertension    CONTROLLED ON MEDS  . Insomnia   . Lyme disease    Chronic  . Myasthenia gravis ([place])    ? Just eyes or systemic  . Myasthenia gravis ([place])   . Myocardial infarction ([place]) 2010  . Seasonal allergies   . Shortness of breath dyspnea    EXERTION  . Sleep apnea    CPAP    Past Surgical History:  Procedure Laterality Date  . BILATERAL CARPAL TUNNEL RELEASE Bilateral L in 2012 and R in 2013  . CARDIAC  CATHETERIZATION     Several Caths, most recent in  March 2016.  [name] COLONOSCOPY WITH PROPOFOL N/A 01/10/2016   Procedure: COLONOSCOPY WITH PROPOFOL;  Surgeon: [name], MD;  Location: [facility] ENDOSCOPY;  Service: Endoscopy;  Laterality: N/A;  . CORONARY ANGIOPLASTY    . ESOPHAGOGASTRODUODENOSCOPY (EGD) WITH PROPOFOL N/A 01/10/2016   Procedure: ESOPHAGOGASTRODUODENOSCOPY (EGD) WITH PROPOFOL;  Surgeon: [name], MD;  Location: [facility] ENDOSCOPY;  Service: Endoscopy;  Laterality: N/A;  . EYE SURGERY Bilateral 2012   cataract/bilateral vitrectomies  . TONSILLECTOMY AND ADENOIDECTOMY     As a child  . TUNNELED VENOUS CATHETER PLACEMENT     removed    Family History  Problem Relation Age of Onset  . Diabetes Mother   . Heart disease Mother   . Cancer Father        Prostate CA    Social History   Socioeconomic History  . Marital status: Married    Spouse name: Not on file  . Number of children: Not on file  . Years of education: Not on file  . Highest education level: Not on file  Social Needs  . Financial resource strain: Not on file  . Food insecurity - worry: Not on file  . Food insecurity - inability: Not on file  . Transportation needs - medical: Not on file  . Transportation needs - non-medical: Not on file  Occupational History  . Not on file  Tobacco Use  . Smoking status: Never Smoker  . Smokeless tobacco: Never Used  Substance and Sexual Activity  . Alcohol use: Yes    Alcohol/week: 0.0 oz    Comment: occasional/1-2 BEERS PER YEAR  . Drug use: No  . Sexual activity: No  Other Topics Concern  . Not on file  Social History Narrative  . Not on file     Current Outpatient Medications:  .  albuterol (PROVENTIL HFA;VENTOLIN HFA) 108 (90 Base) MCG/ACT inhaler, Inhale 2 puffs into the lungs every 6 (six) hours as needed for wheezing or shortness of breath., Disp: , Rfl:  .  aspirin 325 MG tablet, Take 325 mg by mouth daily. AM, Disp: , Rfl:  .  carvedilol (COREG) 3.125  MG tablet, Take 1 tablet (3.125 mg total) by mouth 2 (two) times daily., Disp: 60 tablet, Rfl: 5 .  cyanocobalamin 1000 MCG tablet, Take 1,000 mcg by mouth daily. am, Disp: , Rfl:  .  cyclobenzaprine (FLEXERIL) 10 MG tablet, Take 1 tablet (10 mg total) by mouth 2 (two) times daily., Disp: 60 tablet, Rfl: 5 .  dexlansoprazole (DEXILANT) 60 MG capsule, Take 1 capsule (60 mg total) by mouth daily., Disp: 30 capsule, Rfl: 6 .  gabapentin (NEURONTIN) 300 MG capsule, Take 1 capsule (300 mg total) by mouth 3 (three) times daily., Disp: 90 capsule, Rfl: 1 .  gabapentin (NEURONTIN) 800 MG tablet, Take 1 tablet (800 mg total) by mouth at bedtime., Disp: 90 tablet, Rfl: 0 .  glucose blood (BAYER CONTOUR TEST) test strip, Use as instructed, Disp: 100 each, Rfl: 2 .  insulin lispro (HUMALOG KWIKPEN) 100 UNIT/ML KiwkPen, Inject 8 Units into the skin 3 (three) times daily. Reported on 12/02/2015/ sliding scale, Disp: , Rfl:  .  LANTUS SOLOSTAR 100 UNIT/ML Solostar Pen, Inject 36 Units into the skin daily at 10 pm. , Disp: , Rfl: 0 .  liraglutide (VICTOZA) 18 MG/3ML SOPN, Inject 18 mg into the skin daily. , Disp: , Rfl:  .  Loratadine (CLARITIN PO), Take by mouth daily. am, Disp: , Rfl:  .  metFORMIN (GLUCOPHAGE) 1000 MG tablet, Take 1,000 mg by mouth 2 (two) times daily with a meal., Disp: , Rfl:  .  montelukast (SINGULAIR) 10 MG tablet, every morning. , Disp: , Rfl: 1 .  naloxone (NARCAN) nasal spray 4 mg/0.1 mL, For excess sedation from opioids, Disp: 1 kit, Rfl: 2 .  nitroGLYCERIN (NITROSTAT) 0.3 MG SL tablet, Place 1 tablet (0.3 mg total) under the tongue every 5 (five) minutes as needed for chest pain. Reported on 12/02/2015, Disp: 25 tablet, Rfl: 3 .  Oxycodone HCl 10 MG TABS, Take 1 tablet (10 mg total) by mouth 4 (four) times daily as needed., Disp: 120 tablet, Rfl: 0 .  pyridostigmine (MESTINON) 60 MG tablet, Take 60 mg by mouth 4 (four) times daily. , Disp: , Rfl:  .  triamcinolone cream (KENALOG) 0.1 %,  apply to affected area twice a day UNTIL CLEAR, Disp: , Rfl: 0 .  albuterol (PROVENTIL) (2.5 MG/3ML) 0.083% nebulizer solution, Take 2.5 mg by nebulization every 6 (six) hours as needed for wheezing or shortness of breath., Disp: , Rfl:  .  FLOVENT HFA 220 MCG/ACT inhaler, Inhale 2 puffs into the lungs 2 (two) times daily., Disp: , Rfl: 0 .  fluticasone-salmeterol (ADVAIR HFA) 230-21 MCG/ACT inhaler, Inhale 1 puff into the lungs 2 (two) times daily. Reported on 01/09/2016, Disp: , Rfl:  .  rosuvastatin (CRESTOR) 5 MG tablet, Take 2.5 mg by mouth daily at 6 PM. , Disp: , Rfl:   Current Facility-Administered Medications:  .  lidocaine (PF) (XYLOCAINE) 1 % injection 5 mL, 5 mL, Subcutaneous, Once, Adams, Alvina Filbert, MD .  lidocaine (PF) (XYLOCAINE) 1 % injection 5 mL, 5 mL, Subcutaneous, Once, Adams, Alvina Filbert, MD .  lidocaine (PF) (XYLOCAINE) 1 % injection 5 mL, 5 mL, Subcutaneous, Once, [name], Alvina Filbert, MD .  lidocaine (PF) (XYLOCAINE) 1 % injection 5 mL, 5 mL, Subcutaneous, Once, Molli Barrows, MD .  midazolam (VERSED) injection 5 mg, 5 mg, Intravenous, Once, Molli Barrows, MD .  ropivacaine (PF) 2 mg/ml (0.2%) (NAROPIN) epidural 10 mL, 10 mL, Epidural, Once, Molli Barrows, MD .  ropivacaine (PF) 2 mg/ml (0.2%) (NAROPIN) epidural 10 mL, 10 mL, Epidural, Once, Molli Barrows, MD .  ropivacaine (PF) 2 mg/ml (0.2%) (NAROPIN) epidural 10 mL, 10 mL, Epidural, Once, Molli Barrows, MD .  ropivacaine (PF) 2 mg/ml (0.2%) (NAROPIN) epidural 10 mL, 10 mL, Epidural, Once, Molli Barrows, MD .  sodium chloride flush (NS) 0.9 % injection 10 mL, 10 mL, Other, Once, Molli Barrows, MD .  sodium chloride flush (NS) 0.9 % injection 10 mL, 10 mL, Other, Once, Molli Barrows, MD .  triamcinolone acetonide (KENALOG-40) injection 40 mg, 40 mg, Other, Once, Molli Barrows, MD .  triamcinolone acetonide (KENALOG-40) injection 40 mg, 40 mg, Other, Once, Molli Barrows, MD .  triamcinolone acetonide (KENALOG-40) injection 40  mg, 40 mg, Other, Once, Molli Barrows, MD .  triamcinolone acetonide (KENALOG-40) injection 40 mg, 40 mg, Other, Once, [name] Alvina Filbert, MD  Allergies  Allergen Reactions  . Metoprolol Rash  . Novolog [Insulin Aspart] Hives     Review of Systems  HENT: Positive for ear pain and sore throat. Negative for ear discharge (has noticed a wax-like discharge from the ears. ).   Musculoskeletal: Negative for neck pain.    Objective  Vitals:   10/07/17 1527  BP: 116/74  Pulse: (!) 106  Resp: 14  Temp: 98.2 F (36.8 C)  TempSrc: Oral  SpO2: 96%  Weight: 232 lb 1.6 oz (105.3 kg)    Physical Exam  Constitutional: He is well-developed, well-nourished, and in no distress.  HENT:  Head: Normocephalic and atraumatic.  Right Ear: No drainage.  Left Ear: No drainage.  Nose: Mucosal edema and rhinorrhea present. Right sinus exhibits maxillary sinus tenderness. Left sinus exhibits maxillary sinus tenderness.  Mouth/Throat: Posterior oropharyngeal erythema present. No posterior oropharyngeal edema.  Ear canal mildly erythematous, TM not visualized, whitish discharge visible in both ears. Nasal mucosal inflammation, turbinate hypertrophy   Cardiovascular: Normal rate, regular rhythm and normal heart sounds.  No murmur heard. Pulmonary/Chest: Effort normal. No respiratory distress. He has wheezes in the right middle field, the right lower field, the left middle field and the left lower field. He has no rhonchi. He has no rales.  Nursing note and vitals reviewed.     Assessment & Plan  1. Sore throat Rapid strep is negative - POCT rapid strep A  2. Moderate persistent asthma with exacerbation Will start on tapering course of prednisone, advised to check  blood glucose regularly while on prednisone, azithromycin for infection, obtain chest x-ray - azithromycin (ZITHROMAX) 250 MG tablet; 2 tabs po day 1, then 1 tab po q day x 4  days  Dispense: 6 tablet; Refill: 0 - DG Chest 2 View;  Future - predniSONE (STERAPRED UNI-PAK 21 TAB) 10 MG (21) TBPK tablet; 60 50 40 _0 then STOP  Dispense: 21 tablet; Refill: 0  3. Otalgia of both ears Likely because of sinus inflammation, started on azithromycin  4. Acute non-recurrent maxillary sinusitis  - azithromycin (ZITHROMAX) 250 MG tablet; 2 tabs po day 1, then 1 tab po q day x 4  days  Dispense: 6 tablet; Refill: 0   [place] [facility] 10/07/2017 3:46 PM

## 2017-10-20 DIAGNOSIS — E1165 Type 2 diabetes mellitus with hyperglycemia: Secondary | ICD-10-CM | POA: Diagnosis not present

## 2017-10-20 DIAGNOSIS — Z794 Long term (current) use of insulin: Secondary | ICD-10-CM | POA: Diagnosis not present

## 2017-10-21 ENCOUNTER — Ambulatory Visit: Payer: Medicare Other | Admitting: Anesthesiology

## 2017-10-30 ENCOUNTER — Other Ambulatory Visit: Payer: Self-pay | Admitting: Cardiovascular Disease

## 2017-11-01 ENCOUNTER — Ambulatory Visit: Payer: Medicare Other | Admitting: Anesthesiology

## 2017-11-01 ENCOUNTER — Other Ambulatory Visit: Payer: Self-pay | Admitting: Anesthesiology

## 2017-11-01 ENCOUNTER — Ambulatory Visit
Admission: RE | Admit: 2017-11-01 | Discharge: 2017-11-01 | Disposition: A | Discharge status: Home / Self Care | Payer: Medicare Other | Source: Ambulatory Visit | Attending: Anesthesiology | Admitting: Anesthesiology

## 2017-11-01 ENCOUNTER — Other Ambulatory Visit: Payer: Self-pay

## 2017-11-01 ENCOUNTER — Encounter: Payer: Self-pay | Admitting: Anesthesiology

## 2017-11-01 ENCOUNTER — Ambulatory Visit (HOSPITAL_BASED_OUTPATIENT_CLINIC_OR_DEPARTMENT_OTHER): Payer: Medicare Other | Admitting: Anesthesiology

## 2017-11-01 VITALS — BP 151/98 | Temp 97.5°F | Resp 18 | Ht 68.0 in | Wt 225.0 lb

## 2017-11-01 DIAGNOSIS — G894 Chronic pain syndrome: Secondary | ICD-10-CM

## 2017-11-01 DIAGNOSIS — M5431 Sciatica, right side: Secondary | ICD-10-CM | POA: Insufficient documentation

## 2017-11-01 DIAGNOSIS — R52 Pain, unspecified: Secondary | ICD-10-CM

## 2017-11-01 DIAGNOSIS — M542 Cervicalgia: Secondary | ICD-10-CM

## 2017-11-01 DIAGNOSIS — M47817 Spondylosis without myelopathy or radiculopathy, lumbosacral region: Secondary | ICD-10-CM

## 2017-11-01 DIAGNOSIS — M5136 Other intervertebral disc degeneration, lumbar region: Secondary | ICD-10-CM | POA: Diagnosis not present

## 2017-11-01 DIAGNOSIS — F1190 Opioid use, unspecified, uncomplicated: Secondary | ICD-10-CM

## 2017-11-01 DIAGNOSIS — A6923 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: Secondary | ICD-10-CM

## 2017-11-01 DIAGNOSIS — M4697 Unspecified inflammatory spondylopathy, lumbosacral region: Secondary | ICD-10-CM

## 2017-11-01 MED ORDER — TRIAMCINOLONE ACETONIDE 40 MG/ML IJ SUSP
INTRAMUSCULAR | Status: AC
  Filled 2017-11-01: qty 1

## 2017-11-01 MED ORDER — LIDOCAINE HCL (PF) 1 % IJ SOLN
INTRAMUSCULAR | Status: AC
  Filled 2017-11-01: qty 5

## 2017-11-01 MED ORDER — SODIUM CHLORIDE 0.9% FLUSH
10.0000 mL | Freq: Once | INTRAVENOUS | Status: AC
  Administered 2017-11-01: 10 mL

## 2017-11-01 MED ORDER — ROPIVACAINE HCL 2 MG/ML IJ SOLN
INTRAMUSCULAR | Status: AC
  Filled 2017-11-01: qty 10

## 2017-11-01 MED ORDER — OXYCODONE HCL 10 MG PO TABS: 10.0000 mg | ORAL_TABLET | Freq: Four times a day (QID) | ORAL | 0 refills | Status: DC | PRN

## 2017-11-01 MED ORDER — TRIAMCINOLONE ACETONIDE 40 MG/ML IJ SUSP
40.0000 mg | Freq: Once | INTRAMUSCULAR | Status: AC
  Administered 2017-11-01: 40 mg

## 2017-11-01 MED ORDER — IOPAMIDOL (ISOVUE-M 200) INJECTION 41%
20.0000 mL | Freq: Once | INTRAMUSCULAR | Status: DC | PRN
  Administered 2017-11-01: 10 mL
  Filled 2017-11-01: qty 20

## 2017-11-01 MED ORDER — SODIUM CHLORIDE 0.9 % IJ SOLN
INTRAMUSCULAR | Status: AC
  Filled 2017-11-01: qty 10

## 2017-11-01 MED ORDER — LIDOCAINE HCL (PF) 1 % IJ SOLN
5.0000 mL | Freq: Once | INTRAMUSCULAR | Status: AC
  Administered 2017-11-01: 5 mL via SUBCUTANEOUS

## 2017-11-01 MED ORDER — IOPAMIDOL (ISOVUE-M 200) INJECTION 41%
INTRAMUSCULAR | Status: AC
  Filled 2017-11-01: qty 10

## 2017-11-01 MED ORDER — ROPIVACAINE HCL 2 MG/ML IJ SOLN
10.0000 mL | Freq: Once | INTRAMUSCULAR | Status: AC
  Administered 2017-11-01: 10 mL via EPIDURAL

## 2017-11-01 NOTE — Progress Notes (Signed)
Subjective:  Patient ID: [name], male    DOB: [birth year]  Age: 57 y.o. MRN: [id number]  CC: Back Pain (low left)   Procedure: L5-S1 epidural steroid under fluoroscopic guidance without sedation  HPI [name] presents for reevaluation.  He was last seen in December.  He has a previous history of left side radiculitis at the L5 distribution and a history of facet arthropathy mainly affecting the left side as well.  He has had previous facet blocks back in May and October of last year and did well with these.  He generally gets 75-80% relief lasting 6-8 weeks or better.  As of recent he has had more problems with radiating leg pain going down into the left calf and base of the foot.  This is aggravated by prolonged standing and certain activities.  This pain has presented previously and improved with epidural steroid administration.  Unfortunately he has failed conservative therapy with physical therapy stretching strengthening exercises and medication management.  He presents today requesting an epidural steroid injection for this pain.  He generally gets 75-100% relief of the left lower leg pain with that injection.  Has been taking his medications as well as prescribed with no difficulties.  Based on his narcotic assessment sheet he continues to derive good functional lifestyle improvement with his oxycodone.  He is taking this 4 times a day on average.  He denies any diverting or illicit use.  Outpatient Medications Prior to Visit  Medication Sig Dispense Refill  . albuterol (PROVENTIL HFA;VENTOLIN HFA) 108 (90 Base) MCG/ACT inhaler Inhale 2 puffs into the lungs every 6 (six) hours as needed for wheezing or shortness of breath.    [name] albuterol (PROVENTIL) (2.5 MG/3ML) 0.083% nebulizer solution Take 2.5 mg by nebulization every 6 (six) hours as needed for wheezing or shortness of breath.    [name] aspirin 325 MG tablet Take 325 mg by mouth daily. AM    . carvedilol (COREG) 3.125 MG tablet  take 1 tablet by mouth twice a day 60 tablet 4  . cyanocobalamin 1000 MCG tablet Take 1,000 mcg by mouth daily. am    . cyclobenzaprine (FLEXERIL) 10 MG tablet Take 1 tablet (10 mg total) by mouth 2 (two) times daily. 60 tablet 5  . dexlansoprazole (DEXILANT) 60 MG capsule Take 1 capsule (60 mg total) by mouth daily. 30 capsule 6  . FLOVENT HFA 220 MCG/ACT inhaler Inhale 2 puffs into the lungs 2 (two) times daily.  0  . gabapentin (NEURONTIN) 300 MG capsule Take 1 capsule (300 mg total) by mouth 3 (three) times daily. 90 capsule 1  . gabapentin (NEURONTIN) 800 MG tablet Take 1 tablet (800 mg total) by mouth at bedtime. 90 tablet 0  . glucose blood (BAYER CONTOUR TEST) test strip Use as instructed 100 each 2  . insulin lispro (HUMALOG KWIKPEN) 100 UNIT/ML KiwkPen Inject 8 Units into the skin 3 (three) times daily. Reported on 12/02/2015/ sliding scale    . LANTUS SOLOSTAR 100 UNIT/ML Solostar Pen Inject 36 Units into the skin daily at 10 pm.   0  . liraglutide (VICTOZA) 18 MG/3ML SOPN Inject 18 mg into the skin daily.     . Loratadine (CLARITIN PO) Take by mouth daily. am    . metFORMIN (GLUCOPHAGE) 1000 MG tablet Take 1,000 mg by mouth 2 (two) times daily with a meal.    . montelukast (SINGULAIR) 10 MG tablet every morning.   1  . naloxone (NARCAN) nasal spray 4 mg/0.1  mL For excess sedation from opioids 1 kit 2  . nitroGLYCERIN (NITROSTAT) 0.3 MG SL tablet Place 1 tablet (0.3 mg total) under the tongue every 5 (five) minutes as needed for chest pain. Reported on 12/02/2015 25 tablet 3  . pyridostigmine (MESTINON) 60 MG tablet Take 60 mg by mouth 4 (four) times daily.     [name] triamcinolone cream (KENALOG) 0.1 % apply to affected area twice a day UNTIL CLEAR  0  . Oxycodone HCl 10 MG TABS Take 1 tablet (10 mg total) by mouth 4 (four) times daily as needed. 120 tablet 0  . azithromycin (ZITHROMAX) 250 MG tablet 2 tabs po day 1, then 1 tab po q day x 4  days (Patient not taking: Reported on 11/01/2017) 6  tablet 0  . fluticasone-salmeterol (ADVAIR HFA) 230-21 MCG/ACT inhaler Inhale 1 puff into the lungs 2 (two) times daily. Reported on 01/09/2016    . predniSONE (STERAPRED UNI-PAK 21 TAB) 10 MG (21) TBPK tablet 60 50 40 '30 20 10 '$ then STOP (Patient not taking: Reported on 11/01/2017) 21 tablet 0  . rosuvastatin (CRESTOR) 5 MG tablet Take 2.5 mg by mouth daily at 6 PM.      Facility-Administered Medications Prior to Visit  Medication Dose Route Frequency Provider Last Rate Last Dose  . lidocaine (PF) (XYLOCAINE) 1 % injection 5 mL  5 mL Subcutaneous Once Molli Barrows, MD      . lidocaine (PF) (XYLOCAINE) 1 % injection 5 mL  5 mL Subcutaneous Once Molli Barrows, MD      . lidocaine (PF) (XYLOCAINE) 1 % injection 5 mL  5 mL Subcutaneous Once Molli Barrows, MD      . lidocaine (PF) (XYLOCAINE) 1 % injection 5 mL  5 mL Subcutaneous Once Molli Barrows, MD      . midazolam (VERSED) injection 5 mg  5 mg Intravenous Once Molli Barrows, MD      . ropivacaine (PF) 2 mg/ml (0.2%) (NAROPIN) epidural 10 mL  10 mL Epidural Once Molli Barrows, MD      . ropivacaine (PF) 2 mg/ml (0.2%) (NAROPIN) epidural 10 mL  10 mL Epidural Once Molli Barrows, MD      . ropivacaine (PF) 2 mg/ml (0.2%) (NAROPIN) epidural 10 mL  10 mL Epidural Once Molli Barrows, MD      . ropivacaine (PF) 2 mg/ml (0.2%) (NAROPIN) epidural 10 mL  10 mL Epidural Once Molli Barrows, MD      . sodium chloride flush (NS) 0.9 % injection 10 mL  10 mL Other Once Molli Barrows, MD      . sodium chloride flush (NS) 0.9 % injection 10 mL  10 mL Other Once Molli Barrows, MD      . triamcinolone acetonide (KENALOG-40) injection 40 mg  40 mg Other Once Molli Barrows, MD      . triamcinolone acetonide (KENALOG-40) injection 40 mg  40 mg Other Once Molli Barrows, MD      . triamcinolone acetonide (KENALOG-40) injection 40 mg  40 mg Other Once Molli Barrows, MD      . triamcinolone acetonide (KENALOG-40) injection 40 mg  40 mg Other Once Molli Barrows, MD        Review of Systems CNS: No confusion or sedation Cardiac: No angina or palpitations GI: No abdominal pain or constipation Constitutional no nausea vomiting fevers or chills  Objective:  BP (!) 151/98  Temp (!) 97.5 F (36.4 C)   Resp 18   Ht '5\' 8"'$  (1.727 m)   Wt 225 lb (102.1 kg)   SpO2 99%   BMI 34.21 kg/m    BP Readings from Last 3 Encounters:  11/01/17 (!) 151/98  10/07/17 116/74  09/20/17 (!) 133/91     Wt Readings from Last 3 Encounters:  11/01/17 225 lb (102.1 kg)  10/07/17 232 lb 1.6 oz (105.3 kg)  09/20/17 225 lb (102.1 kg)     Physical Exam Pt is alert and oriented PERRL EOMI HEART IS RRR no murmur or rub LCTA no wheezing or rales MUSCULOSKELETAL reveals some paraspinous muscle tenderness.  He does have a positive straight leg raise on the left side.  His muscle tone and bulk is at baseline he has some paraspinous muscle tenderness on the left side but no overt trigger points.  Labs  Lab Results  Component Value Date   HGBA1C 8.1 08/22/2015   No results found for: GLUF, MICROALBUR, LDLCALC, CREATININE  -------------------------------------------------------------------------------------------------------------------- Lab Results  Component Value Date   ALT 16 12/02/2015   AST 16 12/02/2015   HGBA1C 8.1 08/22/2015    --------------------------------------------------------------------------------------------------------------------- Dg C-arm 1-60 Min-no Report  Result Date: 11/01/2017 Fluoroscopy was utilized by the requesting physician.  No radiographic interpretation.     Assessment & Plan:   Steve was seen today for back pain.  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 -     Oxycodone HCl 10 MG TABS; Take 1 tablet (10 mg total) by mouth 4 (four) times daily as needed. -     triamcinolone acetonide (KENALOG-40) injection 40 mg -     sodium chloride flush (NS) 0.9 % injection 10 mL -     ropivacaine (PF) 2 mg/mL (0.2%) (NAROPIN) injection 10 mL -     lidocaine (PF) (XYLOCAINE) 1 % injection 5 mL -     iopamidol (ISOVUE-M) 41 % intrathecal injection 20 mL        ----------------------------------------------------------------------------------------------------------------------  Problem List Items Addressed This Visit    None    Visit Diagnoses    Facet arthritis of lumbosacral region [place])    -  Primary   Relevant Medications   Oxycodone HCl 10 MG TABS   triamcinolone acetonide (KENALOG-40) injection 40 mg (Completed)   Cervicalgia       Chronic pain syndrome       Chronic, continuous use of opioids       DDD (degenerative disc disease), lumbar       Relevant Medications   Oxycodone HCl 10 MG TABS   triamcinolone acetonide (KENALOG-40) injection 40 mg (Completed)   Sciatica of right side       Lyme arthritis of multiple joints ([facility])       Relevant Medications   Oxycodone HCl 10 MG TABS   triamcinolone acetonide (KENALOG-40) injection 40 mg (Completed)        ----------------------------------------------------------------------------------------------------------------------  1. Facet arthritis of lumbosacral region [facility]) Continue stretching strengthening exercises with core strengthening.  2. Cervicalgia We talked about some exercises for his upper extremity strength and to help with  his chronic neck pain.  He has had no associated upper extremity weakness or worsening of symptoms.  3. Chronic pain syndrome We will continue him on his oxycodone with refills given for February 19 and March 21 we have also reviewed the [facility] practitioner database information and it is appropriate he is to return to clinic in 2 months  4. Chronic, continuous use of opioids As above  5. DDD (degenerative disc disease), lumbar For the radiculitis we will plan on a L5-S1 epidural steroid injection today.  He is failed conservative therapy and responded favorably to these in the past.  The risks and benefits of the procedure been reviewed in full detail and all questions answered.  6. Sciatica of right side As above  7. Lyme arthritis of multiple joints ([facility])     ----------------------------------------------------------------------------------------------------------------------  I am having Tilda Burrow maintain his insulin lispro, pyridostigmine, Loratadine (CLARITIN PO), albuterol, glucose blood, gabapentin, gabapentin, rosuvastatin, metFORMIN, cyanocobalamin, aspirin, fluticasone-salmeterol, albuterol, montelukast, LANTUS SOLOSTAR, naloxone, liraglutide, nitroGLYCERIN, FLOVENT HFA, triamcinolone cream, dexlansoprazole, cyclobenzaprine, azithromycin, predniSONE, carvedilol, and Oxycodone HCl. We administered triamcinolone acetonide, sodium chloride flush, ropivacaine (PF) 2 mg/mL (0.2%), lidocaine (PF), and iopamidol. We will continue to administer lidocaine (PF), midazolam, ropivacaine (PF) 2 mg/mL (0.2%), sodium chloride flush, triamcinolone acetonide, triamcinolone acetonide, sodium chloride flush, ropivacaine (PF) 2 mg/mL (0.2%), lidocaine (PF), triamcinolone acetonide, ropivacaine (PF) 2 mg/mL (0.2%), lidocaine (PF), triamcinolone acetonide, ropivacaine (PF) 2 mg/mL (0.2%), and lidocaine (PF).   Meds ordered this encounter  Medications  . DISCONTD: Oxycodone HCl 10 MG TABS    Sig: Take 1 tablet (10 mg total) by mouth 4 (four) times daily as needed.    Dispense:  120 tablet    Refill:  0    Do not fill until [id number]  . Oxycodone HCl 10 MG TABS    Sig: Take 1 tablet (10 mg total) by mouth 4 (four) times daily  as needed.    Dispense:  120 tablet    Refill:  0    Do not fill until [id number]  . triamcinolone acetonide (KENALOG-40) injection 40 mg  . sodium chloride flush (NS) 0.9 % injection 10 mL  . ropivacaine (PF) 2 mg/mL (0.2%) (NAROPIN) injection 10 mL  . lidocaine (PF) (XYLOCAINE) 1 % injection 5 mL  . iopamidol (ISOVUE-M) 41 % intrathecal injection 20 mL   Patient's Medications  New Prescriptions   No medications on file  Previous Medications   ALBUTEROL (PROVENTIL HFA;VENTOLIN HFA) 108 (90 BASE) MCG/ACT INHALER    Inhale 2 puffs into the lungs every 6 (six) hours as needed for wheezing or shortness of breath.   ALBUTEROL (PROVENTIL) (2.5 MG/3ML) 0.083% NEBULIZER SOLUTION    Take 2.5 mg by nebulization every 6 (six) hours as needed for wheezing or shortness of breath.   ASPIRIN 325 MG TABLET    Take 325 mg by mouth daily. AM   AZITHROMYCIN (ZITHROMAX) 250 MG TABLET    2 tabs po day 1, then 1 tab po q day x 4  days   CARVEDILOL (COREG) 3.125 MG TABLET    take 1 tablet by mouth twice a day   CYANOCOBALAMIN 1000 MCG TABLET    Take 1,000 mcg by mouth daily. am   CYCLOBENZAPRINE (FLEXERIL) 10 MG TABLET    Take 1 tablet (10 mg total) by mouth 2 (two) times daily.   DEXLANSOPRAZOLE (DEXILANT) 60 MG CAPSULE    Take 1 capsule (60 mg total) by mouth daily.   FLOVENT HFA  220 MCG/ACT INHALER    Inhale 2 puffs into the lungs 2 (two) times daily.   FLUTICASONE-SALMETEROL (ADVAIR HFA) 230-21 MCG/ACT INHALER    Inhale 1 puff into the lungs 2 (two) times daily. Reported on 01/09/2016   GABAPENTIN (NEURONTIN) 300 MG CAPSULE    Take 1 capsule (300 mg total) by mouth 3 (three) times daily.   GABAPENTIN (NEURONTIN) 800 MG TABLET    Take 1 tablet (800 mg total) by mouth at bedtime.   GLUCOSE BLOOD (BAYER CONTOUR TEST) TEST STRIP    Use as instructed   INSULIN LISPRO (HUMALOG KWIKPEN) 100 UNIT/ML KIWKPEN    Inject 8 Units into the skin 3 (three) times daily. Reported on 12/02/2015/ sliding scale   LANTUS SOLOSTAR  100 UNIT/ML SOLOSTAR PEN    Inject 36 Units into the skin daily at 10 pm.    LIRAGLUTIDE (VICTOZA) 18 MG/3ML SOPN    Inject 18 mg into the skin daily.    LORATADINE (CLARITIN PO)    Take by mouth daily. am   METFORMIN (GLUCOPHAGE) 1000 MG TABLET    Take 1,000 mg by mouth 2 (two) times daily with a meal.   MONTELUKAST (SINGULAIR) 10 MG TABLET    every morning.    NALOXONE (NARCAN) NASAL SPRAY 4 MG/0.1 ML    For excess sedation from opioids   NITROGLYCERIN (NITROSTAT) 0.3 MG SL TABLET    Place 1 tablet (0.3 mg total) under the tongue every 5 (five) minutes as needed for chest pain. Reported on 12/02/2015   PREDNISONE (STERAPRED UNI-PAK 21 TAB) 10 MG (21) TBPK TABLET    60 50 40 '30 20 10 '$ then STOP   PYRIDOSTIGMINE (MESTINON) 60 MG TABLET    Take 60 mg by mouth 4 (four) times daily.    ROSUVASTATIN (CRESTOR) 5 MG TABLET    Take 2.5 mg by mouth daily at 6 PM.    TRIAMCINOLONE CREAM (KENALOG) 0.1 %    apply to affected area twice a day UNTIL CLEAR  Modified Medications   Modified Medication Previous Medication   OXYCODONE HCL 10 MG TABS Oxycodone HCl 10 MG TABS      Take 1 tablet (10 mg total) by mouth 4 (four) times daily as needed.    Take 1 tablet (10 mg total) by mouth 4 (four) times daily as needed.  Discontinued Medications   No medications on file   ----------------------------------------------------------------------------------------------------------------------  Follow-up: Return in about 2 months (around 12/30/2017) for evaluation, med refill.  Procedure: L5-S1 epidural steroid under fluoroscopic guidance without sedation   Procedure: L5-S1 LESI with fluoroscopic guidance and moderate sedation  NOTE: The risks, benefits, and expectations of the procedure have been discussed and explained to the patient who was understanding and in agreement with suggested treatment plan. No guarantees were made.  DESCRIPTION OF PROCEDURE: Lumbar epidural steroid injection with no IV Versed, EKG,  blood pressure, pulse, and pulse oximetry monitoring. The procedure was performed with the patient in the prone position under fluoroscopic guidance.  Sterile prep x3 was initiated and I then injected subcutaneous lidocaine to the overlying L5-S1 site after its fluoroscopic identifictation.  Using strict aseptic technique, I then advanced an 18-gauge Tuohy epidural needle in the midline using interlaminar approach via loss-of-resistance to saline technique. There was negative aspiration for heme or  CSF.  I then confirmed position with both AP and Lateral fluoroscan.  2 cc of Isovue were injected and a  total of 5 mL of Preservative-Free normal saline mixed with 40  mg of Kenalog and 1cc Ropicaine 0.2 percent were injected incrementally via the  epidurally placed needle. The needle was removed. The patient tolerated the injection well and was convalesced and discharged to home in stable condition. Should the patient have any post procedure difficulty they have been instructed on how to contact us for assistance.    Molli Barrows, MD

## 2017-11-01 NOTE — Patient Instructions (Signed)
Pain Management Discharge Instructions  General Discharge Instructions :  If you need to reach your doctor call: Monday-Friday 8:00 am - 4:00 pm at [phone] or toll free [phone].  After clinic hours [phone] to have operator reach doctor.  Bring all of your medication bottles to all your appointments in the pain clinic.  To cancel or reschedule your appointment with Pain Management please remember to call 24 hours in advance to avoid a fee.  Refer to the educational materials which you have been given on: General Risks, I had my Procedure. Discharge Instructions, Post Sedation.  Post Procedure Instructions:  Please notify your doctor immediately if you have any unusual bleeding, trouble breathing or pain that is not related to your normal pain.  Depending on the type of procedure that was done, some parts of your body may feel week and/or numb.  This usually clears up by tonight or the next day.  Walk with the use of an assistive device or accompanied by an adult for the 24 hours.  You may use ice on the affected area for the first 24 hours.  Put ice in a Ziploc bag and cover with a towel and place against area 15 minutes on 15 minutes off.  You may switch to heat after 24 hours. 

## 2017-11-02 ENCOUNTER — Telehealth: Payer: Self-pay | Admitting: *Deleted

## 2017-11-02 NOTE — Telephone Encounter (Signed)
Denies complications post procedure. 

## 2017-11-10 ENCOUNTER — Ambulatory Visit (INDEPENDENT_AMBULATORY_CARE_PROVIDER_SITE_OTHER): Payer: Medicare Other | Admitting: Family Medicine

## 2017-11-10 ENCOUNTER — Encounter: Payer: Self-pay | Admitting: Family Medicine

## 2017-11-10 VITALS — BP 138/84 | HR 99 | Temp 97.8°F | Resp 14 | Ht 68.0 in | Wt 225.4 lb

## 2017-11-10 DIAGNOSIS — H938X1 Other specified disorders of right ear: Secondary | ICD-10-CM | POA: Diagnosis not present

## 2017-11-10 NOTE — Progress Notes (Signed)
Name: [name]   MRN: [id number]    DOB: Dec 31, 1960   Date:11/10/2017       Progress Note  Subjective  Chief Complaint  Chief Complaint  Patient presents with  . Ear Fullness    onset- sunday Right ear pt states it feels as if he has fluid and when he tilt his head he can feel something moving. Has ear infection the last visit .    Ear Fullness   There is pain in the right ear. This is a new problem. The current episode started in the past 7 days (3 days ago.). The problem has been unchanged. There has been no fever. Pertinent negatives include no ear discharge, headaches, hearing loss, neck pain (pt. has had chronic neck pain) or sore throat. Associated symptoms comments: Ringing in the ears, feels like a waxy build up in his ear. He has tried nothing for the symptoms.     Past Medical History:  Diagnosis Date  . Allergy    dust, seasonal (worse in the fall).  . Anginal pain (HCC)    1 OR 2 x IN 3 MONTHS  . 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 Followed by Pain Specialist in [place], back and neck  . Asthma    BRONCHITIS  . Cataract    First Dx in 2012  . Coronary artery disease    Chronic ischemic heart disease with multiple stents placed in the LAD and right coronary artery. He had prior anterior myocardial infarction. All done in [place].  . Diabetes mellitus without complication ([place])    TYPE 2  . Diabetic peripheral neuropathy (HCC)    feet and hands  . GERD (gastroesophageal reflux disease)   . Headache    muscle tension  . Hyperlipidemia   . Hypertension    CONTROLLED ON MEDS  . Insomnia   . Lyme disease    Chronic  . Myasthenia gravis ([place])    ? Just eyes or systemic  . Myasthenia gravis ([place])   . Myocardial infarction ([place]) 2010  . Seasonal allergies   . Shortness of breath dyspnea    EXERTION  . Sleep apnea    CPAP    Past Surgical History:  Procedure Laterality Date  . BILATERAL CARPAL TUNNEL RELEASE Bilateral L in 2012 and R in 2013  . CARDIAC  CATHETERIZATION     Several Caths, most recent in  March 2016.  [name] COLONOSCOPY WITH PROPOFOL N/A 01/10/2016   Procedure: COLONOSCOPY WITH PROPOFOL;  Surgeon: [name], MD;  Location: [facility] ENDOSCOPY;  Service: Endoscopy;  Laterality: N/A;  . CORONARY ANGIOPLASTY    . ESOPHAGOGASTRODUODENOSCOPY (EGD) WITH PROPOFOL N/A 01/10/2016   Procedure: ESOPHAGOGASTRODUODENOSCOPY (EGD) WITH PROPOFOL;  Surgeon: [name], MD;  Location: [facility] ENDOSCOPY;  Service: Endoscopy;  Laterality: N/A;  . EYE SURGERY Bilateral 2012   cataract/bilateral vitrectomies  . TONSILLECTOMY AND ADENOIDECTOMY     As a child  . TUNNELED VENOUS CATHETER PLACEMENT     removed    Family History  Problem Relation Age of Onset  . Diabetes Mother   . Heart disease Mother   . Cancer Father        Prostate CA    Social History   Socioeconomic History  . Marital status: Married    Spouse name: Not on file  . Number of children: Not on file  . Years of education: Not on file  . Highest education level: Not on file  Social Needs  . Emergency planning/management officer  strain: Not on file  . Food insecurity - worry: Not on file  . Food insecurity - inability: Not on file  . Transportation needs - medical: Not on file  . Transportation needs - non-medical: Not on file  Occupational History  . Not on file  Tobacco Use  . Smoking status: Never Smoker  . Smokeless tobacco: Never Used  Substance and Sexual Activity  . Alcohol use: Yes    Alcohol/week: 0.0 oz    Comment: occasional/1-2 BEERS PER YEAR  . Drug use: No  . Sexual activity: No  Other Topics Concern  . Not on file  Social History Narrative  . Not on file     Current Outpatient Medications:  .  albuterol (PROVENTIL HFA;VENTOLIN HFA) 108 (90 Base) MCG/ACT inhaler, Inhale 2 puffs into the lungs every 6 (six) hours as needed for wheezing or shortness of breath., Disp: , Rfl:  .  albuterol (PROVENTIL) (2.5 MG/3ML) 0.083% nebulizer solution, Take 2.5 mg by nebulization every 6  (six) hours as needed for wheezing or shortness of breath., Disp: , Rfl:  .  aspirin 325 MG tablet, Take 325 mg by mouth daily. AM, Disp: , Rfl:  .  carvedilol (COREG) 3.125 MG tablet, take 1 tablet by mouth twice a day, Disp: 60 tablet, Rfl: 4 .  cyanocobalamin 1000 MCG tablet, Take 1,000 mcg by mouth daily. am, Disp: , Rfl:  .  cyclobenzaprine (FLEXERIL) 10 MG tablet, Take 1 tablet (10 mg total) by mouth 2 (two) times daily., Disp: 60 tablet, Rfl: 5 .  dexlansoprazole (DEXILANT) 60 MG capsule, Take 1 capsule (60 mg total) by mouth daily., Disp: 30 capsule, Rfl: 6 .  FLOVENT HFA 220 MCG/ACT inhaler, Inhale 2 puffs into the lungs 2 (two) times daily., Disp: , Rfl: 0 .  fluticasone-salmeterol (ADVAIR HFA) 230-21 MCG/ACT inhaler, Inhale 1 puff into the lungs 2 (two) times daily. Reported on 01/09/2016, Disp: , Rfl:  .  gabapentin (NEURONTIN) 300 MG capsule, Take 1 capsule (300 mg total) by mouth 3 (three) times daily., Disp: 90 capsule, Rfl: 1 .  gabapentin (NEURONTIN) 800 MG tablet, Take 1 tablet (800 mg total) by mouth at bedtime., Disp: 90 tablet, Rfl: 0 .  glucose blood (BAYER CONTOUR TEST) test strip, Use as instructed, Disp: 100 each, Rfl: 2 .  insulin lispro (HUMALOG KWIKPEN) 100 UNIT/ML KiwkPen, Inject 8 Units into the skin 3 (three) times daily. Reported on 12/02/2015/ sliding scale, Disp: , Rfl:  .  LANTUS SOLOSTAR 100 UNIT/ML Solostar Pen, Inject 33 Units into the skin daily at 10 pm. , Disp: , Rfl: 0 .  liraglutide (VICTOZA) 18 MG/3ML SOPN, Inject 18 mg into the skin daily. , Disp: , Rfl:  .  Loratadine (CLARITIN PO), Take by mouth daily. am, Disp: , Rfl:  .  metFORMIN (GLUCOPHAGE) 1000 MG tablet, Take 1,000 mg by mouth 2 (two) times daily with a meal., Disp: , Rfl:  .  montelukast (SINGULAIR) 10 MG tablet, every morning. , Disp: , Rfl: 1 .  nitroGLYCERIN (NITROSTAT) 0.3 MG SL tablet, Place 1 tablet (0.3 mg total) under the tongue every 5 (five) minutes as needed for chest pain. Reported on  12/02/2015, Disp: 25 tablet, Rfl: 3 .  Oxycodone HCl 10 MG TABS, Take 1 tablet (10 mg total) by mouth 4 (four) times daily as needed., Disp: 120 tablet, Rfl: 0 .  pyridostigmine (MESTINON) 60 MG tablet, Take 60 mg by mouth 4 (four) times daily. , Disp: , Rfl:  .  triamcinolone cream (KENALOG) 0.1 %, apply to affected area twice a day UNTIL CLEAR, Disp: , Rfl: 0 .  azithromycin (ZITHROMAX) 250 MG tablet, 2 tabs po day 1, then 1 tab po q day x 4  days (Patient not taking: Reported on 11/01/2017), Disp: 6 tablet, Rfl: 0 .  naloxone (NARCAN) nasal spray 4 mg/0.1 mL, For excess sedation from opioids (Patient not taking: Reported on 11/10/2017), Disp: 1 kit, Rfl: 2 .  predniSONE (STERAPRED UNI-PAK 21 TAB) 10 MG (21) TBPK tablet, 60 50 40 _0 then STOP (Patient not taking: Reported on 11/01/2017), Disp: 21 tablet, Rfl: 0 .  rosuvastatin (CRESTOR) 5 MG tablet, Take 2.5 mg by mouth daily at 6 PM. , Disp: , Rfl:   Current Facility-Administered Medications:  .  lidocaine (PF) (XYLOCAINE) 1 % injection 5 mL, 5 mL, Subcutaneous, Once, Adams, Alvina Filbert, MD .  lidocaine (PF) (XYLOCAINE) 1 % injection 5 mL, 5 mL, Subcutaneous, Once, Adams, Alvina Filbert, MD .  lidocaine (PF) (XYLOCAINE) 1 % injection 5 mL, 5 mL, Subcutaneous, Once, Adams, Alvina Filbert, MD .  lidocaine (PF) (XYLOCAINE) 1 % injection 5 mL, 5 mL, Subcutaneous, Once, [name], Alvina Filbert, MD .  midazolam (VERSED) injection 5 mg, 5 mg, Intravenous, Once, Molli Barrows, MD .  ropivacaine (PF) 2 mg/ml (0.2%) (NAROPIN) epidural 10 mL, 10 mL, Epidural, Once, Molli Barrows, MD .  ropivacaine (PF) 2 mg/ml (0.2%) (NAROPIN) epidural 10 mL, 10 mL, Epidural, Once, Molli Barrows, MD .  ropivacaine (PF) 2 mg/ml (0.2%) (NAROPIN) epidural 10 mL, 10 mL, Epidural, Once, Molli Barrows, MD .  ropivacaine (PF) 2 mg/ml (0.2%) (NAROPIN) epidural 10 mL, 10 mL, Epidural, Once, Molli Barrows, MD .  sodium chloride flush (NS) 0.9 % injection 10 mL, 10 mL, Other, Once, Molli Barrows, MD .   sodium chloride flush (NS) 0.9 % injection 10 mL, 10 mL, Other, Once, Molli Barrows, MD .  triamcinolone acetonide (KENALOG-40) injection 40 mg, 40 mg, Other, Once, Molli Barrows, MD .  triamcinolone acetonide (KENALOG-40) injection 40 mg, 40 mg, Other, Once, Molli Barrows, MD .  triamcinolone acetonide (KENALOG-40) injection 40 mg, 40 mg, Other, Once, Molli Barrows, MD .  triamcinolone acetonide (KENALOG-40) injection 40 mg, 40 mg, Other, Once, [name] Alvina Filbert, MD  Allergies  Allergen Reactions  . Metoprolol Rash  . Novolog [Insulin Aspart] Hives     Review of Systems  HENT: Negative for ear discharge, hearing loss and sore throat.   Musculoskeletal: Negative for neck pain (pt. has had chronic neck pain).  Neurological: Negative for headaches.    Objective  Vitals:   11/10/17 1157  BP: 138/84  Pulse: 99  Resp: 14  Temp: 97.8 F (36.6 C)  TempSrc: Oral  SpO2: 95%  Weight: 225 lb 6.4 oz (102.2 kg)  Height: _1  (1.727 m)    Physical Exam  Constitutional: He is oriented to person, place, and time and well-developed, well-nourished, and in no distress.  HENT:  Head: Normocephalic and atraumatic.  Ear canal normal on the right raised mass-like structure jutting into the canal normal variant vs pathology? No drainage noted, TM gray.  Cardiovascular: Normal rate, regular rhythm and normal heart sounds.  No murmur heard. Pulmonary/Chest: Effort normal and breath sounds normal.  Neurological: He is alert and oriented to person, place, and time.  Psychiatric: Mood, memory, affect and judgment normal.  Nursing note and vitals reviewed.     Recent Results (  from the past 2160 hour(s))  POCT rapid strep A     Status: Normal   Collection Time: 10/07/17  3:51 PM  Result Value Ref Range   Rapid Strep A Screen Negative Negative     Assessment & Plan  1. Fullness in ear, right Referral to ENT for management. - Ambulatory referral to ENT   Kenedee Molesky Asad A.  [facility] 11/10/2017 12:12 PM

## 2017-11-15 ENCOUNTER — Ambulatory Visit: Payer: Medicare Other | Admitting: Anesthesiology

## 2017-11-16 ENCOUNTER — Ambulatory Visit: Payer: Medicare Other | Admitting: Nurse Practitioner

## 2017-11-19 DIAGNOSIS — Z794 Long term (current) use of insulin: Secondary | ICD-10-CM | POA: Diagnosis not present

## 2017-11-19 DIAGNOSIS — E1165 Type 2 diabetes mellitus with hyperglycemia: Secondary | ICD-10-CM | POA: Diagnosis not present

## 2017-11-25 ENCOUNTER — Encounter: Payer: Self-pay | Admitting: Nurse Practitioner

## 2017-11-25 ENCOUNTER — Ambulatory Visit: Payer: Medicare Other | Admitting: Nurse Practitioner

## 2017-11-25 VITALS — BP 100/70 | HR 105 | Ht 67.0 in | Wt 226.5 lb

## 2017-11-25 DIAGNOSIS — E782 Mixed hyperlipidemia: Secondary | ICD-10-CM

## 2017-11-25 DIAGNOSIS — R000 Tachycardia, unspecified: Secondary | ICD-10-CM

## 2017-11-25 DIAGNOSIS — I25119 Atherosclerotic heart disease of native coronary artery with unspecified angina pectoris: Secondary | ICD-10-CM | POA: Diagnosis not present

## 2017-11-25 DIAGNOSIS — I10 Essential (primary) hypertension: Secondary | ICD-10-CM | POA: Diagnosis not present

## 2017-11-25 DIAGNOSIS — I25118 Atherosclerotic heart disease of native coronary artery with other forms of angina pectoris: Secondary | ICD-10-CM | POA: Diagnosis not present

## 2017-11-25 MED ORDER — EZETIMIBE 10 MG PO TABS: 10.0000 mg | ORAL_TABLET | Freq: Every day | ORAL | 3 refills | Status: DC

## 2017-11-25 NOTE — Progress Notes (Signed)
Office Visit    Patient Name: [name] Date of Encounter: 11/25/2017  Primary Care Provider:  Roselee Nova, MD Primary Cardiologist:  [name], MD  Chief Complaint    57 y/o ? with a history of CAD status post multiple stents placed in the LAD and RCA, hypertension, hyperlipidemia, type 2 diabetes mellitus, diabetic neuropathy, myasthenia gravis, and sleep apnea, who presents for follow-up.  Past Medical History    Past Medical History:  Diagnosis Date  . Allergy    dust, seasonal (worse in the fall).  . Anginal pain (HCC)    1 OR 2 x IN 3 MONTHS  . 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 Followed by Pain Specialist in [place], back and neck  . Asthma    BRONCHITIS  . Cataract    First Dx in 2012  . Coronary artery disease    a. Prior Ant MI->s/p multiple stents placed in the LAD and right coronary artery (10 stents in [place]); b. 2016 Cath: reportedly nonobs dzs;  c. 02/2017 MV: EF 45-54%, medium defect of severe severity in apical anterior, apical inf, and apex. + infarct, no ishcemia.  . Diabetes mellitus without complication ([place])    TYPE 2  . Diabetic peripheral neuropathy (HCC)    feet and hands  . GERD (gastroesophageal reflux disease)   . Headache    muscle tension  . Hyperlipidemia   . Hypertension    CONTROLLED ON MEDS  . Insomnia   . Lyme disease    Chronic  . Myasthenia gravis ([place])   . Myocardial infarction ([place]) 2010  . Seasonal allergies   . Sleep apnea    CPAP   Past Surgical History:  Procedure Laterality Date  . BILATERAL CARPAL TUNNEL RELEASE Bilateral L in 2012 and R in 2013  . CARDIAC CATHETERIZATION     Several Caths, most recent in  March 2016.  [name] COLONOSCOPY WITH PROPOFOL N/A 01/10/2016   Procedure: COLONOSCOPY WITH PROPOFOL;  Surgeon: [name], MD;  Location: [facility] ENDOSCOPY;  Service: Endoscopy;  Laterality: N/A;  . CORONARY ANGIOPLASTY    . ESOPHAGOGASTRODUODENOSCOPY (EGD) WITH PROPOFOL N/A 01/10/2016   Procedure:  ESOPHAGOGASTRODUODENOSCOPY (EGD) WITH PROPOFOL;  Surgeon: [name], MD;  Location: [facility] ENDOSCOPY;  Service: Endoscopy;  Laterality: N/A;  . EYE SURGERY Bilateral 2012   cataract/bilateral vitrectomies  . TONSILLECTOMY AND ADENOIDECTOMY     As a child  . TUNNELED VENOUS CATHETER PLACEMENT     removed    Allergies  Allergies  Allergen Reactions  . Metoprolol Rash  . Novolog [Insulin Aspart] Hives    History of Present Illness    57 year old ? with the above complex past medical history including coronary artery disease status post prior myocardial infarction in 2009 with subsequent placement of a total of 10 stents between the LAD and right coronary artery.  These were all performed in [place].  Last catheterization March 2016 reportedly showed no obstructive disease and he has been treated medically since.  Other history includes hypertension, hyperlipidemia, type 2 diabetes mellitus, diabetic neuropathy, myasthenia gravis, GERD, and sleep apnea on CPAP.    He has some degree of chronic angina, occurring about once a month, typically at rest, usually described as a sharp pain, lasting a few minutes, and resolving with rest or with nitroglycerin.  Over the past 6 months, he's had 3 episodes that presented more with heaviness then with a sharp pain.  Symptoms may or may not be associated with  dyspnea when they occur.  These 3 episodes of heaviness also occurred at rest, or mild in severity, lasted about 10 minutes, and on 2 occasions he took sublingual nitroglycerin with relief.  He says that overall, his symptoms have been stable and he has been tolerating his medications well.  He says in the past 6 months, he has felt about as good as he felt in a long time.  He is fairly active, walking his dog approximately 1 block 3-4 times per day and then also using exercise bands for about 15 minutes every other day.  He has never had chest pain or heaviness with exertion.  He does have some degree of  chronic dyspnea on exertion when walking up inclines but this is unchanged.  He continues to have mild orthostasis if he stands up too quickly.  He has also been having more vertiginous-like symptoms and is currently being worked up for inner ear issues.  He has noted that his blood pressures tend to run low in the evenings and as result, he has been taking his carvedilol to just once daily.  He denies PND, orthopnea, syncope, edema, or early satiety.  Home Medications    Prior to Admission medications   Medication Sig Start Date End Date Taking? Authorizing Provider  albuterol (PROVENTIL HFA;VENTOLIN HFA) 108 (90 Base) MCG/ACT inhaler Inhale 2 puffs into the lungs every 6 (six) hours as needed for wheezing or shortness of breath.   Yes [provider]  albuterol (PROVENTIL) (2.5 MG/3ML) 0.083% nebulizer solution Take 2.5 mg by nebulization every 6 (six) hours as needed for wheezing or shortness of breath.   Yes [provider]  aspirin 325 MG tablet Take 325 mg by mouth daily. AM   Yes [provider]  carvedilol (COREG) 3.125 MG tablet take 1 tablet by mouth twice a day 11/01/17  Yes [name], MD  cyanocobalamin 1000 MCG tablet Take 1,000 mcg by mouth daily. am   Yes [provider]  cyclobenzaprine (FLEXERIL) 10 MG tablet Take 1 tablet (10 mg total) by mouth 2 (two) times daily. 07/14/17  Yes Molli Barrows, MD  dexlansoprazole (DEXILANT) 60 MG capsule Take 1 capsule (60 mg total) by mouth daily. 05/05/17  Yes [name], MD  FLOVENT HFA 220 MCG/ACT inhaler Inhale 2 puffs into the lungs 2 (two) times daily. 04/05/17  Yes [provider]  gabapentin (NEURONTIN) 300 MG capsule Take 1 capsule (300 mg total) by mouth 3 (three) times daily. 09/25/15  Yes Roselee Nova, MD  gabapentin (NEURONTIN) 800 MG tablet Take 1 tablet (800 mg total) by mouth at bedtime. 09/25/15  Yes Keith Rake Asad A, MD  glucose blood (BAYER CONTOUR TEST) test strip Use as  instructed 09/03/15  Yes Keith Rake Asad A, MD  insulin lispro (HUMALOG KWIKPEN) 100 UNIT/ML KiwkPen Inject 8 Units into the skin 3 (three) times daily. Reported on 12/02/2015/ sliding scale   Yes [provider]  LANTUS SOLOSTAR 100 UNIT/ML Solostar Pen Inject 33 Units into the skin daily at 10 pm.  03/25/16  Yes [provider]  liraglutide (VICTOZA) 18 MG/3ML SOPN Inject 18 mg into the skin daily.    Yes [provider]  Loratadine (CLARITIN PO) Take by mouth daily. am   Yes [provider]  metFORMIN (GLUCOPHAGE) 1000 MG tablet Take 1,000 mg by mouth 2 (two) times daily with a meal.   Yes [provider]  montelukast (SINGULAIR) 10 MG tablet every  morning.  04/01/16  Yes [provider]  naloxone [facility]) nasal spray 4 mg/0.1 mL For excess sedation from opioids 09/15/16  Yes Molli Barrows, MD  nitroGLYCERIN (NITROSTAT) 0.3 MG SL tablet Place 1 tablet (0.3 mg total) under the tongue every 5 (five) minutes as needed for chest pain. Reported on 12/02/2015 01/25/17  Yes [name], MD  Oxycodone HCl 10 MG TABS Take 1 tablet (10 mg total) by mouth 4 (four) times daily as needed. 11/01/17  Yes Molli Barrows, MD  pyridostigmine (MESTINON) 60 MG tablet Take 60 mg by mouth 4 (four) times daily.    Yes [provider]  rosuvastatin (CRESTOR) 5 MG tablet Take 5 mg by mouth daily. 11/20/17  Yes [provider]  triamcinolone cream (KENALOG) 0.1 % apply to affected area twice a day UNTIL CLEAR 04/09/17  Yes [provider]    Review of Systems    As above, he still has some degree of orthostasis if he gets up too quickly.  He has recently been dealing with more vertiginous type symptoms and is being worked up by ENT.  He has chronic, intermittent angina occurring about once a month.  This may or may not be associated with dyspnea but only ever occurs at rest.  Overall, symptoms have been stable.  His chronic mild dyspnea with  inclines.  He denies PND, orthopnea, palpitations, syncope, edema, or early satiety.  All other systems reviewed and are otherwise negative except as noted above.  Physical Exam    VS:  BP 100/70 (BP Location: Left Arm, Patient Position: Sitting, Cuff Size: Normal)   Pulse (!) 105   Ht 5\' 7"  (1.702 m)   Wt 226 lb 8 oz (102.7 kg)   BMI 35.47 kg/m  , BMI Body mass index is 35.47 kg/m. GEN: Well nourished, well developed, in no acute distress.  HEENT: normal.  Neck: Supple, no JVD, carotid bruits, or masses. Cardiac: RRR, no murmurs, rubs, or gallops. No clubbing, cyanosis, edema.  Radials/DP/PT 2+ and equal bilaterally.  Respiratory:  Respirations regular and unlabored, clear to auscultation bilaterally. GI: Soft, nontender, nondistended, BS + x 4. MS: no deformity or atrophy. Skin: warm and dry, no rash. Neuro:  Strength and sensation are intact. Psych: Normal affect.  Accessory Clinical Findings    ECG -sinus tachycardia, 105, prior inferior and anterolateral infarcts, no acute changes.  Assessment & Plan    1.  Coronary artery disease/chest pain: Patient has a prior history of CAD status post multiple stents in the LAD and RCA dating back to 2009.  Last catheterization in 2016 reportedly showed nonobstructive disease.  He underwent stress testing in May 2018 which was notable for infarct without evidence of ischemia.  He has somewhat chronic, intermittent chest discomfort, occurring about once a month, at rest, lasting about 10 minutes, and resolving spontaneously.  The pain can either be sharp or at least 3 times in the past 6 months, more like a heaviness.  Pain may or may not be associated with dyspnea.  Overall, he feels as though symptoms are stable.  He has remained active, walking daily and performing other types of exercises at least every other day and as above, has not had symptoms with activity.  He remains on aspirin, beta-blocker, and statin therapy.  2.  Essential  hypertension/orthostasis: His blood pressure tends to run soft at home.  He has been taking his carvedilol 3.125 mg just once daily.  Pressure is 100/70 today.  He is most likely to be symptomatic when pressure is below 115 or so.  In that setting, I have asked him to cut his carvedilol in half and take half a tablet twice a day in order to provide more of a 24-hour effect.  He was agreeable with this.  3.  Hyperlipidemia: Currently on low-dose rosuvastatin.  LDL was 94 in May 2018.  I am going to add Zetia 10 mg daily and plan to follow-up lipids and LFTs in 6-8 weeks.  4.  Sinus tachycardia: Heart rate 105 today.  Asymptomatic.  I note heart rate was 100 when he was here last.  He says that his heart rate has been elevated for some time.  I will follow-up a CBC, basic metabolic panel, and TSH today.  Last TSH was normal in 2017.  Continue beta-blocker.  5.  Type 2 diabetes mellitus: A1c improving (6.9 in November 2018) with weight loss and medication management by primary care.  6.  Disposition: Follow-up lab work today.  Follow-up lipids and LFTs in 6 weeks given that we are adding Zetia.  Follow-up with Dr. Fletcher Anon in 6 months or sooner if necessary.  Murray Hodgkins, NP 11/25/2017, 2:31 PM

## 2017-11-25 NOTE — Patient Instructions (Signed)
Medication Instructions: - Your physician has recommended you make the following change in your medication:  1) START zetia 10 mg- take 1 tablet by mouth once daily  Labwork: - Your physician recommends that you have lab work today: BMP/ CBC/ TSH  - Your physician recommends that you return for FASTING lab work in: 8 weeks- lipid/ liver  Procedures/Testing: - none ordered  Follow-Up: - Your physician wants you to follow-up in: 6 months with Dr. Fletcher Anon. You will receive a reminder letter in the mail two months in advance. If you don't receive a letter, please call our office to schedule the follow-up appointment.   Any Additional Special Instructions Will Be Listed Below (If Applicable).     If you need a refill on your cardiac medications before your next appointment, please call your pharmacy.

## 2017-11-26 ENCOUNTER — Other Ambulatory Visit: Payer: Self-pay

## 2017-11-26 LAB — TSH: TSH: 4.13 u[IU]/mL (ref 0.450–4.500)

## 2017-11-26 LAB — CBC WITH DIFFERENTIAL/PLATELET
BASOS: 0 %
Basophils Absolute: 0 10*3/uL (ref 0.0–0.2)
EOS (ABSOLUTE): 0.3 10*3/uL (ref 0.0–0.4)
EOS: 4 %
HEMATOCRIT: 40.7 % (ref 37.5–51.0)
HEMOGLOBIN: 13.1 g/dL (ref 13.0–17.7)
IMMATURE GRANS (ABS): 0 10*3/uL (ref 0.0–0.1)
IMMATURE GRANULOCYTES: 0 %
Lymphocytes Absolute: 1.5 10*3/uL (ref 0.7–3.1)
Lymphs: 22 %
MCH: 28.1 pg (ref 26.6–33.0)
MCHC: 32.2 g/dL (ref 31.5–35.7)
MCV: 87 fL (ref 79–97)
MONOCYTES: 8 %
MONOS ABS: 0.5 10*3/uL (ref 0.1–0.9)
NEUTROS PCT: 66 %
Neutrophils Absolute: 4.5 10*3/uL (ref 1.4–7.0)
Platelets: 244 10*3/uL (ref 150–379)
RBC: 4.66 x10E6/uL (ref 4.14–5.80)
RDW: 14.1 % (ref 12.3–15.4)
WBC: 6.8 10*3/uL (ref 3.4–10.8)

## 2017-11-26 LAB — BASIC METABOLIC PANEL
BUN/Creatinine Ratio: 17 (ref 9–20)
BUN: 21 mg/dL (ref 6–24)
CALCIUM: 9.9 mg/dL (ref 8.7–10.2)
CO2: 21 mmol/L (ref 20–29)
CREATININE: 1.25 mg/dL (ref 0.76–1.27)
Chloride: 100 mmol/L (ref 96–106)
GFR, EST AFRICAN AMERICAN: 73 mL/min/{1.73_m2} (ref >59)
GFR, EST NON AFRICAN AMERICAN: 63 mL/min/{1.73_m2} (ref >59)
Glucose: 159 mg/dL — ABNORMAL HIGH (ref 65–99)
POTASSIUM: 5.8 mmol/L — AB (ref 3.5–5.2)
Sodium: 139 mmol/L (ref 134–144)

## 2017-11-29 ENCOUNTER — Other Ambulatory Visit
Admission: RE | Admit: 2017-11-29 | Discharge: 2017-11-29 | Disposition: A | Discharge status: Home / Self Care | Payer: Medicare Other | Source: Ambulatory Visit | Attending: Nurse Practitioner | Admitting: Nurse Practitioner

## 2017-11-29 ENCOUNTER — Telehealth: Payer: Self-pay | Admitting: Cardiovascular Disease

## 2017-11-29 DIAGNOSIS — E782 Mixed hyperlipidemia: Secondary | ICD-10-CM | POA: Diagnosis not present

## 2017-11-29 DIAGNOSIS — R000 Tachycardia, unspecified: Secondary | ICD-10-CM

## 2017-11-29 DIAGNOSIS — I25119 Atherosclerotic heart disease of native coronary artery with unspecified angina pectoris: Secondary | ICD-10-CM

## 2017-11-29 DIAGNOSIS — G7000 Myasthenia gravis without (acute) exacerbation: Secondary | ICD-10-CM | POA: Diagnosis not present

## 2017-11-29 DIAGNOSIS — I10 Essential (primary) hypertension: Secondary | ICD-10-CM

## 2017-11-29 DIAGNOSIS — H02402 Unspecified ptosis of left eyelid: Secondary | ICD-10-CM | POA: Diagnosis not present

## 2017-11-29 LAB — BASIC METABOLIC PANEL
Anion gap: 10 (ref 5–15)
BUN: 16 mg/dL (ref 6–20)
CALCIUM: 9.3 mg/dL (ref 8.9–10.3)
CHLORIDE: 102 mmol/L (ref 101–111)
CO2: 27 mmol/L (ref 22–32)
Creatinine, Ser: 1.12 mg/dL (ref 0.61–1.24)
GFR calc Af Amer: >60 mL/min (ref >60)
GFR calc non Af Amer: >60 mL/min (ref >60)
GLUCOSE: 93 mg/dL (ref 65–99)
POTASSIUM: 4.5 mmol/L (ref 3.5–5.1)
Sodium: 139 mmol/L (ref 135–145)

## 2017-11-29 LAB — HEPATIC FUNCTION PANEL
ALBUMIN: 3.7 g/dL (ref 3.5–5.0)
ALT: 17 U/L (ref 17–63)
AST: 22 U/L (ref 15–41)
Alkaline Phosphatase: 76 U/L (ref 38–126)
BILIRUBIN TOTAL: 0.5 mg/dL (ref 0.3–1.2)
Bilirubin, Direct: <0.1 mg/dL — ABNORMAL LOW (ref 0.1–0.5)
TOTAL PROTEIN: 6.8 g/dL (ref 6.5–8.1)

## 2017-11-29 LAB — LIPID PANEL
CHOLESTEROL: 141 mg/dL (ref 0–200)
HDL: 38 mg/dL — ABNORMAL LOW (ref >40)
LDL Cholesterol: 77 mg/dL (ref 0–99)
TRIGLYCERIDES: 130 mg/dL (ref <150)
Total CHOL/HDL Ratio: 3.7 RATIO
VLDL: 26 mg/dL (ref 0–40)

## 2017-11-29 NOTE — Telephone Encounter (Signed)
Pt returning our call °Please call back ° °

## 2017-11-29 NOTE — Telephone Encounter (Signed)
Called [facility] and requested that they add on BMet per previous request. Lipid and liver profiles were due to be done in 6-8 weeks with Bmet due now. Will alert provider to monitor for pending results.

## 2017-11-29 NOTE — Telephone Encounter (Signed)
Left voicemail message that I would call back.

## 2017-11-29 NOTE — Telephone Encounter (Signed)
Spoke with patient and reviewed all lab results and recommendations. Reviewed that we would like to get repeat labs in 6-8 weeks from start of Zetia and he was agreeable with this plan. He verbalized understanding with no further questions at this time.

## 2017-11-29 NOTE — Telephone Encounter (Signed)
Left voicemail message for patient to call back.

## 2017-11-30 DIAGNOSIS — H903 Sensorineural hearing loss, bilateral: Secondary | ICD-10-CM | POA: Diagnosis not present

## 2017-12-06 ENCOUNTER — Encounter: Payer: Self-pay | Admitting: Family Medicine

## 2017-12-06 ENCOUNTER — Other Ambulatory Visit: Payer: Self-pay

## 2017-12-06 ENCOUNTER — Ambulatory Visit: Payer: Medicare Other | Admitting: Family Medicine

## 2017-12-06 VITALS — BP 130/80 | HR 100 | Temp 97.6°F | Resp 18 | Ht 67.0 in | Wt 232.5 lb

## 2017-12-06 DIAGNOSIS — R42 Dizziness and giddiness: Secondary | ICD-10-CM

## 2017-12-06 DIAGNOSIS — H833X1 Noise effects on right inner ear: Secondary | ICD-10-CM

## 2017-12-06 MED ORDER — MECLIZINE HCL 25 MG PO TABS: 12.5000 mg | ORAL_TABLET | Freq: Three times a day (TID) | ORAL | 0 refills | Status: DC | PRN

## 2017-12-06 MED ORDER — DEXLANSOPRAZOLE 60 MG PO CPDR: 60.0000 mg | DELAYED_RELEASE_CAPSULE | Freq: Every day | ORAL | 6 refills | Status: DC

## 2017-12-06 NOTE — Progress Notes (Signed)
Name: [name]   MRN: [id number]    DOB: Dec 12, 1960   Date:12/06/2017       Progress Note  Subjective  Chief Complaint  Chief Complaint  Patient presents with  . Otalgia  . Dizziness    HPI  PT presents with concern for ongoing otalgia and dizziness x3-4 weeks. Dizziness worsens positionally; has history myasthenia gravis and advanced lyme disease- Sees Dr. Melrose Nakayama with Neurology (last visit was 11/29/2017, pt mentioned symptoms to Dr. Melrose Nakayama who did not feel this was Lyme or MG related).  Was referred to [facility] ENT and saw them 11/30/2017 - was diagnosed with mild sensorineural hearing loss and bilateral osteomas - no follow up was recommended.  Otalgia is made worse with loud noises. Notes new onset hoarse voice and some body aches that started today - adivsed likely viral in nature but to monitor symptoms.  Denies facial droop, slurred speech, confusion, abnormal extremity weakness, nausea.   Patient Active Problem List   Diagnosis Date Noted  . Chest congestion 03/22/2017  . Ptosis of left eyelid 01/08/2017  . Dermatitis 12/24/2016  . Difficulty walking 04/27/2016  . Foot cramps 04/27/2016  . Annual physical exam 04/06/2016  . Obesity, Class II, BMI 35-39.9, with comorbidity 04/06/2016  . Special screening for malignant neoplasms, colon   . Benign neoplasm of sigmoid colon   . Benign neoplasm of descending colon   . Benign neoplasm of transverse colon   . Heartburn   . Gastritis   . Coronary artery disease involving native coronary artery with angina pectoris ([place]) 10/22/2015  . Asthma with acute exacerbation 10/08/2015  . Coronary artery disease 08/22/2015  . DM (diabetes mellitus), type 2, uncontrolled with complications ([place]) 78/67/6720  . Myasthenia gravis ([place]) 08/22/2015  . Chronic left-sided low back pain 08/22/2015  . Hypertension 08/22/2015  . Asthma 08/22/2015  . GERD (gastroesophageal reflux disease) 08/22/2015  . Arthritis 08/22/2015  . Diabetic peripheral  neuropathy ([place]) 08/22/2015  . Lyme disease 08/22/2015  . Hyperlipidemia 08/22/2015  . Insomnia 08/22/2015    Social History   Tobacco Use  . Smoking status: Never Smoker  . Smokeless tobacco: Never Used  Substance Use Topics  . Alcohol use: Yes    Alcohol/week: 0.0 oz    Comment: occasional/1-2 BEERS PER YEAR     Current Outpatient Medications:  .  albuterol (PROVENTIL HFA;VENTOLIN HFA) 108 (90 Base) MCG/ACT inhaler, Inhale 2 puffs into the lungs every 6 (six) hours as needed for wheezing or shortness of breath., Disp: , Rfl:  .  albuterol (PROVENTIL) (2.5 MG/3ML) 0.083% nebulizer solution, Take 2.5 mg by nebulization every 6 (six) hours as needed for wheezing or shortness of breath., Disp: , Rfl:  .  aspirin 325 MG tablet, Take 325 mg by mouth daily. AM, Disp: , Rfl:  .  carvedilol (COREG) 3.125 MG tablet, take 1 tablet by mouth twice a day, Disp: 60 tablet, Rfl: 4 .  cyanocobalamin 1000 MCG tablet, Take 1,000 mcg by mouth daily. am, Disp: , Rfl:  .  cyclobenzaprine (FLEXERIL) 10 MG tablet, Take 1 tablet (10 mg total) by mouth 2 (two) times daily., Disp: 60 tablet, Rfl: 5 .  dexlansoprazole (DEXILANT) 60 MG capsule, Take 1 capsule (60 mg total) by mouth daily., Disp: 30 capsule, Rfl: 6 .  ezetimibe (ZETIA) 10 MG tablet, Take 1 tablet (10 mg total) by mouth daily., Disp: 90 tablet, Rfl: 3 .  FLOVENT HFA 220 MCG/ACT inhaler, Inhale 2 puffs into the lungs 2 (two) times daily.,  Disp: , Rfl: 0 .  gabapentin (NEURONTIN) 300 MG capsule, Take 1 capsule (300 mg total) by mouth 3 (three) times daily., Disp: 90 capsule, Rfl: 1 .  gabapentin (NEURONTIN) 800 MG tablet, Take 1 tablet (800 mg total) by mouth at bedtime., Disp: 90 tablet, Rfl: 0 .  glucose blood (BAYER CONTOUR TEST) test strip, Use as instructed, Disp: 100 each, Rfl: 2 .  insulin lispro (HUMALOG KWIKPEN) 100 UNIT/ML KiwkPen, Inject 8 Units into the skin 3 (three) times daily. Reported on 12/02/2015/ sliding scale, Disp: , Rfl:  .   LANTUS SOLOSTAR 100 UNIT/ML Solostar Pen, Inject 33 Units into the skin daily at 10 pm. , Disp: , Rfl: 0 .  liraglutide (VICTOZA) 18 MG/3ML SOPN, Inject 18 mg into the skin daily. , Disp: , Rfl:  .  Loratadine (CLARITIN PO), Take by mouth daily. am, Disp: , Rfl:  .  metFORMIN (GLUCOPHAGE) 1000 MG tablet, Take 1,000 mg by mouth 2 (two) times daily with a meal., Disp: , Rfl:  .  montelukast (SINGULAIR) 10 MG tablet, every morning. , Disp: , Rfl: 1 .  naloxone (NARCAN) nasal spray 4 mg/0.1 mL, For excess sedation from opioids, Disp: 1 kit, Rfl: 2 .  nitroGLYCERIN (NITROSTAT) 0.3 MG SL tablet, Place 1 tablet (0.3 mg total) under the tongue every 5 (five) minutes as needed for chest pain. Reported on 12/02/2015, Disp: 25 tablet, Rfl: 3 .  Oxycodone HCl 10 MG TABS, Take 1 tablet (10 mg total) by mouth 4 (four) times daily as needed., Disp: 120 tablet, Rfl: 0 .  pyridostigmine (MESTINON) 60 MG tablet, Take 60 mg by mouth 4 (four) times daily. , Disp: , Rfl:  .  rosuvastatin (CRESTOR) 5 MG tablet, Take 5 mg by mouth daily., Disp: , Rfl: 0 .  triamcinolone cream (KENALOG) 0.1 %, apply to affected area twice a day UNTIL CLEAR, Disp: , Rfl: 0  Current Facility-Administered Medications:  .  lidocaine (PF) (XYLOCAINE) 1 % injection 5 mL, 5 mL, Subcutaneous, Once, Adams, Alvina Filbert, MD .  lidocaine (PF) (XYLOCAINE) 1 % injection 5 mL, 5 mL, Subcutaneous, Once, Adams, Alvina Filbert, MD .  lidocaine (PF) (XYLOCAINE) 1 % injection 5 mL, 5 mL, Subcutaneous, Once, [name], Alvina Filbert, MD .  lidocaine (PF) (XYLOCAINE) 1 % injection 5 mL, 5 mL, Subcutaneous, Once, [name], Alvina Filbert, MD .  midazolam (VERSED) injection 5 mg, 5 mg, Intravenous, Once, Molli Barrows, MD .  ropivacaine (PF) 2 mg/ml (0.2%) (NAROPIN) epidural 10 mL, 10 mL, Epidural, Once, Molli Barrows, MD .  ropivacaine (PF) 2 mg/ml (0.2%) (NAROPIN) epidural 10 mL, 10 mL, Epidural, Once, Molli Barrows, MD .  ropivacaine (PF) 2 mg/ml (0.2%) (NAROPIN) epidural 10 mL, 10 mL,  Epidural, Once, Molli Barrows, MD .  ropivacaine (PF) 2 mg/ml (0.2%) (NAROPIN) epidural 10 mL, 10 mL, Epidural, Once, Molli Barrows, MD .  sodium chloride flush (NS) 0.9 % injection 10 mL, 10 mL, Other, Once, Molli Barrows, MD .  sodium chloride flush (NS) 0.9 % injection 10 mL, 10 mL, Other, Once, Molli Barrows, MD .  triamcinolone acetonide (KENALOG-40) injection 40 mg, 40 mg, Other, Once, Molli Barrows, MD .  triamcinolone acetonide (KENALOG-40) injection 40 mg, 40 mg, Other, Once, Molli Barrows, MD .  triamcinolone acetonide (KENALOG-40) injection 40 mg, 40 mg, Other, Once, Molli Barrows, MD .  triamcinolone acetonide (KENALOG-40) injection 40 mg, 40 mg, Other, Once, [name] Alvina Filbert, MD  Allergies  Allergen Reactions  . Metoprolol Rash  . Novolog [Insulin Aspart] Hives    ROS  Ten systems reviewed and is negative except as mentioned in HPI.  Objective  Vitals:   12/06/17 1513  BP: 130/80  Pulse: 100  Resp: 18  Temp: 97.6 F (36.4 C)  TempSrc: Oral  SpO2: 96%  Weight: 232 lb 8 oz (105.5 kg)  Height: '5\' 7"'$  (1.702 m)   Body mass index is 36.41 kg/m.  Nursing Note and Vital Signs reviewed.  Physical Exam  Constitutional: Patient appears well-developed and well-nourished. Obese. No distress.  HEENT: head atraumatic, normocephalic, pupils equal and reactive to light, EOM's intact, TM's without erythema or bulging, no maxillary or frontal sinus tenderness , neck supple without lymphadenopathy, oropharynx pink and moist without exudate. Cardiovascular: Normal rate, regular rhythm, S1/S2 present.  No murmur or rub heard. No BLE edema. Pulmonary/Chest: Effort normal and breath sounds clear. No respiratory distress or retractions. Psychiatric: Patient has a normal mood and affect. behavior is normal. Judgment and thought content normal. Neurological: he is alert and oriented to person, place, and time. No cranial nerve deficit. Coordination, balance, strength, speech and  gait are normal.  Dix-Hall-Pike positive for RIGHT sided vertigo - nystagmus present on maneuver. Skin: Skin is warm and dry. No rash noted. No erythema.   No results found for this or any previous visit (from the past 72 hour(s)).  Assessment & Plan  1. Vertigo - Advised follow up in 1 week if not improving - and we will recommend a close follow up with Dr. Melrose Nakayama at that time if symptoms are not resolving.  - meclizine (ANTIVERT) 25 MG tablet; Take 0.5 tablets (12.5 mg total) by mouth 3 (three) times daily as needed for dizziness.  Dispense: 20 tablet; Refill: 0 - Epley maneuvers discussed in detail. - Signs of stroke reviewed in detail.  2. Sound sensitivity in right ear Advised possibly secondary to vertigo symptoms. We will treat for vertigo, and if not improving, we will refer back to ENT for evaluation   -Red flags and when to present for emergency care or RTC including fever >101.110F, chest pain, shortness of breath, new/worsening/un-resolving symptoms, facial droop, slurred speech, confusion, abnormal extremity weakness, reviewed with patient at time of visit. Follow up and care instructions discussed and provided in AVS.

## 2017-12-06 NOTE — Patient Instructions (Addendum)
How to Perform the Epley Maneuver The Epley maneuver is an exercise that relieves symptoms of vertigo. Vertigo is the feeling that you or your surroundings are moving when they are not. When you feel vertigo, you may feel like the room is spinning and have trouble walking. Dizziness is a little different than vertigo. When you are dizzy, you may feel unsteady or light-headed. You can do this maneuver at home whenever you have symptoms of vertigo. You can do it up to 3 times a day until your symptoms go away. Even though the Epley maneuver may relieve your vertigo for a few weeks, it is possible that your symptoms will return. This maneuver relieves vertigo, but it does not relieve dizziness. What are the risks? If it is done correctly, the Epley maneuver is considered safe. Sometimes it can lead to dizziness or nausea that goes away after a short time. If you develop other symptoms, such as changes in vision, weakness, or numbness, stop doing the maneuver and call your health care provider. How to perform the Epley maneuver 1. Sit on the edge of a bed or table with your back straight and your legs extended or hanging over the edge of the bed or table. 2. Turn your head halfway toward the affected ear or side. 3. Lie backward quickly with your head turned until you are lying flat on your back. You may want to position a pillow under your shoulders. 4. Hold this position for 30 seconds. You may experience an attack of vertigo. This is normal. 5. Turn your head to the opposite direction until your unaffected ear is facing the floor. 6. Hold this position for 30 seconds. You may experience an attack of vertigo. This is normal. Hold this position until the vertigo stops. 7. Turn your whole body to the same side as your head. Hold for another 30 seconds. 8. Sit back up. You can repeat this exercise up to 3 times a day. Follow these instructions at home:  After doing the Epley maneuver, you can return to  your normal activities.  Ask your health care provider if there is anything you should do at home to prevent vertigo. He or she may recommend that you: ? Keep your head raised (elevated) with two or more pillows while you sleep. ? Do not sleep on the side of your affected ear. ? Get up slowly from bed. ? Avoid sudden movements during the day. ? Avoid extreme head movement, like looking up or bending over. Contact a health care provider if:  Your vertigo gets worse.  You have other symptoms, including: ? Nausea. ? Vomiting. ? Headache. Get help right away if:  You have vision changes.  You have a severe or worsening headache or neck pain.  You cannot stop vomiting.  You have new numbness or weakness in any part of your body. Summary  Vertigo is the feeling that you or your surroundings are moving when they are not.  The Epley maneuver is an exercise that relieves symptoms of vertigo.  If the Epley maneuver is done correctly, it is considered safe. You can do it up to 3 times a day. This information is not intended to replace advice given to you by your health care provider. Make sure you discuss any questions you have with your health care provider. Document Released: 09/26/2013 Document Revised: 08/11/2016 Document Reviewed: 08/11/2016 Elsevier Interactive Patient Education  2017 [employer]  

## 2017-12-09 ENCOUNTER — Telehealth: Payer: Self-pay | Admitting: Family Medicine

## 2017-12-09 NOTE — Telephone Encounter (Signed)
Copied from [place] [phone]. Topic: Quick Communication - See Telephone Encounter >> Dec 09, 2017  9:46 AM Cleaster Corin, NT wrote: CRM for notification. See Telephone encounter for:   12/09/17. Pt. Wife  Calling to let NP Raelyn Ensign know that pt isn't felling any better. Ears are achy now. And hurts to swallow. I offered to make another appt. To come In. But pt. Stated the emily told them to call and let her know Lattie Haw he was feeling in a couple of days. Cb# [phone] (wife cell phone)

## 2017-12-09 NOTE — Telephone Encounter (Signed)
Pt's wife called and stated she was on the phone w/ Raelyn Ensign and got disconnected, contact pt back to continue what was being discussed about the pt's condition

## 2017-12-09 NOTE — Telephone Encounter (Signed)
Spoke to wife Mrs. Sciascia  And advised her if Mr. Guin was not feeling any better, please return to office on tomorrow or go to [place]. She stated that Mr. Mathers was asleep and if he is not feeling any better will call office for appointment tomorrow.

## 2018-01-12 DIAGNOSIS — E1165 Type 2 diabetes mellitus with hyperglycemia: Secondary | ICD-10-CM | POA: Diagnosis not present

## 2018-01-12 DIAGNOSIS — Z794 Long term (current) use of insulin: Secondary | ICD-10-CM | POA: Diagnosis not present

## 2018-01-13 DIAGNOSIS — G4733 Obstructive sleep apnea (adult) (pediatric): Secondary | ICD-10-CM | POA: Diagnosis not present

## 2018-01-13 DIAGNOSIS — J310 Chronic rhinitis: Secondary | ICD-10-CM | POA: Diagnosis not present

## 2018-01-13 DIAGNOSIS — J4521 Mild intermittent asthma with (acute) exacerbation: Secondary | ICD-10-CM | POA: Diagnosis not present

## 2018-01-17 ENCOUNTER — Other Ambulatory Visit: Payer: Self-pay

## 2018-01-17 ENCOUNTER — Ambulatory Visit (HOSPITAL_BASED_OUTPATIENT_CLINIC_OR_DEPARTMENT_OTHER): Payer: Medicare Other | Admitting: Anesthesiology

## 2018-01-17 ENCOUNTER — Encounter: Payer: Self-pay | Admitting: Anesthesiology

## 2018-01-17 ENCOUNTER — Ambulatory Visit (INDEPENDENT_AMBULATORY_CARE_PROVIDER_SITE_OTHER): Payer: Medicare Other

## 2018-01-17 ENCOUNTER — Other Ambulatory Visit: Payer: Self-pay | Admitting: Anesthesiology

## 2018-01-17 ENCOUNTER — Ambulatory Visit: Payer: Medicare Other | Admitting: Anesthesiology

## 2018-01-17 ENCOUNTER — Ambulatory Visit: Admission: RE | Admit: 2018-01-17 | Payer: Medicare Other | Source: Ambulatory Visit

## 2018-01-17 VITALS — BP 155/90 | HR 92 | Temp 97.8°F | Resp 16 | Ht 67.0 in | Wt 222.0 lb

## 2018-01-17 DIAGNOSIS — G894 Chronic pain syndrome: Secondary | ICD-10-CM | POA: Diagnosis not present

## 2018-01-17 DIAGNOSIS — M5136 Other intervertebral disc degeneration, lumbar region: Secondary | ICD-10-CM

## 2018-01-17 DIAGNOSIS — F1190 Opioid use, unspecified, uncomplicated: Secondary | ICD-10-CM | POA: Diagnosis not present

## 2018-01-17 DIAGNOSIS — M542 Cervicalgia: Secondary | ICD-10-CM

## 2018-01-17 DIAGNOSIS — M47817 Spondylosis without myelopathy or radiculopathy, lumbosacral region: Secondary | ICD-10-CM | POA: Diagnosis not present

## 2018-01-17 DIAGNOSIS — I10 Essential (primary) hypertension: Secondary | ICD-10-CM

## 2018-01-17 DIAGNOSIS — G7000 Myasthenia gravis without (acute) exacerbation: Secondary | ICD-10-CM | POA: Diagnosis not present

## 2018-01-17 DIAGNOSIS — A6923 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: Secondary | ICD-10-CM

## 2018-01-17 DIAGNOSIS — J4540 Moderate persistent asthma, uncomplicated: Secondary | ICD-10-CM | POA: Diagnosis not present

## 2018-01-17 DIAGNOSIS — A669 Yaws, unspecified: Secondary | ICD-10-CM

## 2018-01-17 MED ORDER — OXYCODONE HCL 10 MG PO TABS: 10.0000 mg | ORAL_TABLET | Freq: Four times a day (QID) | ORAL | 0 refills | Status: DC | PRN

## 2018-01-17 NOTE — Progress Notes (Signed)
Nursing Pain Medication Assessment:  Safety precautions to be maintained throughout the outpatient stay will include: orient to surroundings, keep bed in low position, maintain call bell within reach at all times, provide assistance with transfer out of bed and ambulation.  Medication Inspection Compliance: Pill count conducted under aseptic conditions, in front of the patient. Neither the pills nor the bottle was removed from the patient's sight at any time. Once count was completed pills were immediately returned to the patient in their original bottle.  Medication: Oxycodone IR Pill/Patch Count: 12 of 120 pills remain Pill/Patch Appearance: Markings consistent with prescribed medication Bottle Appearance: Standard pharmacy container. Clearly labeled. Filled Date: 03/21 / 2019 Last Medication intake:  Today

## 2018-01-18 LAB — HEPATIC FUNCTION PANEL
ALBUMIN: 3.8 g/dL (ref 3.5–5.5)
ALK PHOS: 87 IU/L (ref 39–117)
ALT: 27 IU/L (ref 0–44)
AST: 24 IU/L (ref 0–40)
BILIRUBIN, DIRECT: 0.12 mg/dL (ref 0.00–0.40)
Bilirubin Total: 0.4 mg/dL (ref 0.0–1.2)
TOTAL PROTEIN: 6 g/dL (ref 6.0–8.5)

## 2018-01-18 LAB — LIPID PANEL
CHOL/HDL RATIO: 3.4 ratio (ref 0.0–5.0)
Cholesterol, Total: 113 mg/dL (ref 100–199)
HDL: 33 mg/dL — ABNORMAL LOW (ref >39)
LDL CALC: 62 mg/dL (ref 0–99)
Triglycerides: 92 mg/dL (ref 0–149)
VLDL Cholesterol Cal: 18 mg/dL (ref 5–40)

## 2018-01-21 LAB — TOXASSURE SELECT 13 (MW), URINE

## 2018-01-25 ENCOUNTER — Ambulatory Visit: Payer: Medicare Other | Admitting: Anesthesiology

## 2018-01-25 ENCOUNTER — Ambulatory Visit (INDEPENDENT_AMBULATORY_CARE_PROVIDER_SITE_OTHER): Payer: Medicare Other | Admitting: Family Medicine

## 2018-01-25 ENCOUNTER — Encounter: Payer: Self-pay | Admitting: Family Medicine

## 2018-01-25 VITALS — BP 140/90 | HR 78 | Resp 16 | Ht 67.0 in | Wt 228.0 lb

## 2018-01-25 DIAGNOSIS — Z794 Long term (current) use of insulin: Secondary | ICD-10-CM | POA: Diagnosis not present

## 2018-01-25 DIAGNOSIS — Z114 Encounter for screening for human immunodeficiency virus [HIV]: Secondary | ICD-10-CM | POA: Diagnosis not present

## 2018-01-25 DIAGNOSIS — R062 Wheezing: Secondary | ICD-10-CM | POA: Diagnosis not present

## 2018-01-25 DIAGNOSIS — Z1159 Encounter for screening for other viral diseases: Secondary | ICD-10-CM | POA: Diagnosis not present

## 2018-01-25 DIAGNOSIS — M4697 Unspecified inflammatory spondylopathy, lumbosacral region: Secondary | ICD-10-CM

## 2018-01-25 DIAGNOSIS — I25119 Atherosclerotic heart disease of native coronary artery with unspecified angina pectoris: Secondary | ICD-10-CM | POA: Diagnosis not present

## 2018-01-25 DIAGNOSIS — E1142 Type 2 diabetes mellitus with diabetic polyneuropathy: Secondary | ICD-10-CM

## 2018-01-25 DIAGNOSIS — I10 Essential (primary) hypertension: Secondary | ICD-10-CM

## 2018-01-25 DIAGNOSIS — Z23 Encounter for immunization: Secondary | ICD-10-CM | POA: Diagnosis not present

## 2018-01-25 DIAGNOSIS — G7000 Myasthenia gravis without (acute) exacerbation: Secondary | ICD-10-CM

## 2018-01-25 DIAGNOSIS — J4540 Moderate persistent asthma, uncomplicated: Secondary | ICD-10-CM

## 2018-01-25 LAB — POCT GLYCOSYLATED HEMOGLOBIN (HGB A1C): HEMOGLOBIN A1C: 7.2

## 2018-01-25 LAB — POCT UA - MICROALBUMIN: MICROALBUMIN (UR) POC: NEGATIVE mg/L

## 2018-01-25 NOTE — Progress Notes (Signed)
Name: CASHEL BELLINA   MRN: [id number]    DOB: 21-Nov-1960   Date:01/25/2018       Progress Note  Subjective  Chief Complaint  Chief Complaint  Patient presents with  . Establish Care  . Diabetes    HPI  DMII : he has neuropathy, already on gabapentin given by Dr. Melrose Nakayama. HgbA1C today is 7.2%. Denies polyphagia, polydipsia or polyuria. Taking medication as prescribed and since on metformin we will check B12 level. Failed monofilament test today.   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 No diaphoresis, has intermittent SOB  Asthma: he sees pulmonologist and had a severe flare two weeks ago, he was given cephalexin and prednisone , feeling better, but still has some SOB and wheezing. Using inhalers as prescribed  Morbid obesity: based on co-morbidities. Explained importance of life style modification   MG: seeing neurologist, he has ptosis of left eye, fatigue after activity. Stable on medications but states one of the medications is causing side effects and will discuss it with Dr. Melrose Nakayama.   Inflammatory spondylopathy: continue follow up with pain clinic  Patient Active Problem List   Diagnosis Date Noted  . 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[place]) 01/25/2018  . Hyperlipidemia due to type 2 diabetes mellitus ([place]) 08/12/2017  . Vitamin D deficiency, unspecified 08/12/2017  . Ptosis of left eyelid 01/08/2017  . Dermatitis 12/24/2016  . Difficulty walking 04/27/2016  . Foot cramps 04/27/2016  . Morbid (severe) obesity due to excess calories ([place]) 04/06/2016  . Benign neoplasm of sigmoid colon   . Benign neoplasm of descending colon   . Benign neoplasm of transverse colon   . Coronary artery disease involving native coronary artery with angina pectoris ([place]) 10/22/2015  . Coronary artery disease 08/22/2015  . DM (diabetes mellitus), type 2, uncontrolled with complications ([place])  40/98/1191  . Myasthenia gravis ([place]) 08/22/2015  . Chronic left-sided low back pain 08/22/2015  . Hypertension 08/22/2015  . GERD (gastroesophageal reflux disease) 08/22/2015  . Arthritis 08/22/2015  . Diabetic peripheral neuropathy ([place]) 08/22/2015  . Lyme disease 08/22/2015  . Hyperlipidemia 08/22/2015  . Insomnia 08/22/2015  . Type 2 diabetes mellitus with diabetic polyneuropathy, with long-term current use of insulin ([place]) 08/22/2015    Past Surgical History:  Procedure Laterality Date  . BILATERAL CARPAL TUNNEL RELEASE Bilateral L in 2012 and R in 2013  . CARDIAC CATHETERIZATION     Several Caths, most recent in  March 2016.  [name] COLONOSCOPY WITH PROPOFOL N/A 01/10/2016   Procedure: COLONOSCOPY WITH PROPOFOL;  Surgeon: [name], MD;  Location: [facility] ENDOSCOPY;  Service: Endoscopy;  Laterality: N/A;  . CORONARY ANGIOPLASTY    . ESOPHAGOGASTRODUODENOSCOPY (EGD) WITH PROPOFOL N/A 01/10/2016   Procedure: ESOPHAGOGASTRODUODENOSCOPY (EGD) WITH PROPOFOL;  Surgeon: [name], MD;  Location: [facility] ENDOSCOPY;  Service: Endoscopy;  Laterality: N/A;  . EYE SURGERY Bilateral 2012   cataract/bilateral vitrectomies  . TONSILLECTOMY AND ADENOIDECTOMY     As a child  . TUNNELED VENOUS CATHETER PLACEMENT     removed    Family History  Problem Relation Age of Onset  . Diabetes Mother   . Heart disease Mother   . Cancer Father        Prostate CA    Social History   Socioeconomic History  . Marital status: Married    Spouse name: Neoma Laming  . Number of children: 1  . Years of education: Not on  file  . Highest education level: Not on file  Occupational History  . Occupation: disabled  Social Needs  . Financial resource strain: Not hard at all  . Food insecurity:    Worry: Never true    Inability: Never true  . Transportation needs:    Medical: No    Non-medical: No  Tobacco Use  . Smoking status: Never Smoker  . Smokeless tobacco: Never Used  Substance and Sexual Activity  .  Alcohol use: Yes    Alcohol/week: 0.0 oz    Comment: occasional/1-2 BEERS PER YEAR  . Drug use: No  . Sexual activity: Never  Lifestyle  . Physical activity:    Days per week: 0 days    Minutes per session: 0 min  . Stress: Not at all  Relationships  . Social connections:    Talks on phone: Once a week    Gets together: Once a week    Attends religious service: 1 to 4 times per year    Active member of club or organization: No    Attends meetings of clubs or organizations: Never    Relationship status: Married  . Intimate partner violence:    Fear of current or ex partner: No    Emotionally abused: No    Physically abused: No    Forced sexual activity: No  Other Topics Concern  . Not on file  Social History Narrative  . Not on file     Current Outpatient Medications:  .  albuterol (PROVENTIL HFA;VENTOLIN HFA) 108 (90 Base) MCG/ACT inhaler, Inhale 2 puffs into the lungs every 6 (six) hours as needed for wheezing or shortness of breath., Disp: , Rfl:  .  albuterol (PROVENTIL) (2.5 MG/3ML) 0.083% nebulizer solution, Take 2.5 mg by nebulization every 6 (six) hours as needed for wheezing or shortness of breath., Disp: , Rfl:  .  aspirin 325 MG tablet, Take 325 mg by mouth daily. AM, Disp: , Rfl:  .  carvedilol (COREG) 3.125 MG tablet, take 1 tablet by mouth twice a day, Disp: 60 tablet, Rfl: 4 .  cyanocobalamin 1000 MCG tablet, Take 1,000 mcg by mouth daily. am, Disp: , Rfl:  .  cyclobenzaprine (FLEXERIL) 10 MG tablet, Take 1 tablet (10 mg total) by mouth 2 (two) times daily., Disp: 60 tablet, Rfl: 5 .  dexlansoprazole (DEXILANT) 60 MG capsule, Take 1 capsule (60 mg total) by mouth daily., Disp: 30 capsule, Rfl: 6 .  ezetimibe (ZETIA) 10 MG tablet, Take 1 tablet (10 mg total) by mouth daily., Disp: 90 tablet, Rfl: 3 .  FLOVENT HFA 220 MCG/ACT inhaler, Inhale 2 puffs into the lungs 2 (two) times daily., Disp: , Rfl: 0 .  gabapentin (NEURONTIN) 300 MG capsule, Take 1 capsule (300 mg  total) by mouth 3 (three) times daily., Disp: 90 capsule, Rfl: 1 .  gabapentin (NEURONTIN) 800 MG tablet, Take 1 tablet (800 mg total) by mouth at bedtime., Disp: 90 tablet, Rfl: 0 .  glucose blood (BAYER CONTOUR TEST) test strip, Use as instructed, Disp: 100 each, Rfl: 2 .  insulin lispro (HUMALOG KWIKPEN) 100 UNIT/ML KiwkPen, Inject 8 Units into the skin 3 (three) times daily. Reported on 12/02/2015/ sliding scale, Disp: , Rfl:  .  LANTUS SOLOSTAR 100 UNIT/ML Solostar Pen, Inject 33 Units into the skin daily at 10 pm. , Disp: , Rfl: 0 .  liraglutide (VICTOZA) 18 MG/3ML SOPN, Inject 18 mg into the skin daily. , Disp: , Rfl:  .  Loratadine (CLARITIN PO),  Take by mouth daily. am, Disp: , Rfl:  .  metFORMIN (GLUCOPHAGE) 1000 MG tablet, Take 1,000 mg by mouth 2 (two) times daily with a meal., Disp: , Rfl:  .  montelukast (SINGULAIR) 10 MG tablet, every morning. , Disp: , Rfl: 1 .  mycophenolate (CELLCEPT) 250 MG capsule, wk1-2: 1tab nightly wk3-4:1tab 2xdaily wk5-6 1tab AM, 2tab PM wk7-8: 2tabs 2xdaily, Disp: , Rfl:  .  naloxone (NARCAN) nasal spray 4 mg/0.1 mL, For excess sedation from opioids, Disp: 1 kit, Rfl: 2 .  nitroGLYCERIN (NITROSTAT) 0.3 MG SL tablet, Place 1 tablet (0.3 mg total) under the tongue every 5 (five) minutes as needed for chest pain. Reported on 12/02/2015, Disp: 25 tablet, Rfl: 3 .  Oxycodone HCl 10 MG TABS, Take 1 tablet (10 mg total) by mouth 4 (four) times daily as needed., Disp: 120 tablet, Rfl: 0 .  pyridostigmine (MESTINON) 60 MG tablet, Take 60 mg by mouth 4 (four) times daily., Disp: , Rfl:  .  rosuvastatin (CRESTOR) 5 MG tablet, Take 5 mg by mouth daily., Disp: , Rfl: 0 .  triamcinolone cream (KENALOG) 0.1 %, apply to affected area twice a day UNTIL CLEAR, Disp: , Rfl: 0  Allergies  Allergen Reactions  . Metoprolol Rash  . Novolog [Insulin Aspart] Hives     ROS  Constitutional: Negative for fever or weight change.  Respiratory: Positive for cough and shortness of  breath.   Cardiovascular: Positive for intermittent chest pain but no palpitations.  Gastrointestinal: Negative for abdominal pain, no bowel changes.  Musculoskeletal: Negative for gait problem or joint swelling.  Skin: Negative for rash.  Neurological: Negative for dizziness or headache.  No other specific complaints in a complete review of systems (except as listed in HPI above).  Objective  Vitals:   01/25/18 1200  BP: 140/90  Pulse: 78  Resp: 16  SpO2: 95%  Weight: 228 lb (103.4 kg)  Height: '5\' 7"'$  (1.702 m)    Body mass index is 35.71 kg/m.  Physical Exam  Constitutional: Patient appears well-developed and well-nourished. Obese  No distress.  HEENT: head atraumatic, normocephalic, pupils equal and reactive to light, neck supple, throat within normal limits, ptosis of left  eyelid Cardiovascular: Normal rate, regular rhythm and normal heart sounds.  No murmur heard. No BLE edema. Pulmonary/Chest: Effort normal and breath sounds normal. No respiratory distress. Abdominal: Soft.  There is no tenderness. Psychiatric: Patient has a normal mood and affect. behavior is normal. Judgment and thought content normal.  Recent Results (from the past 2160 hour(s))  CBC with Differential/Platelet     Status: None   Collection Time: 11/25/17  2:32 PM  Result Value Ref Range   WBC 6.8 3.4 - 10.8 x10E3/uL   RBC 4.66 4.14 - 5.80 x10E6/uL   Hemoglobin 13.1 13.0 - 17.7 g/dL   Hematocrit 40.7 37.5 - 51.0 %   MCV 87 79 - 97 fL   MCH 28.1 26.6 - 33.0 pg   MCHC 32.2 31.5 - 35.7 g/dL   RDW 14.1 12.3 - 15.4 %   Platelets 244 150 - 379 x10E3/uL   Neutrophils 66 Not Estab. %   Lymphs 22 Not Estab. %   Monocytes 8 Not Estab. %   Eos 4 Not Estab. %   Basos 0 Not Estab. %   Neutrophils Absolute 4.5 1.4 - 7.0 x10E3/uL   Lymphocytes Absolute 1.5 0.7 - 3.1 x10E3/uL   Monocytes Absolute 0.5 0.1 - 0.9 x10E3/uL   EOS (ABSOLUTE) 0.3 0.0 -  0.4 x10E3/uL   Basophils Absolute 0.0 0.0 - 0.2 x10E3/uL    Immature Granulocytes 0 Not Estab. %   Immature Grans (Abs) 0.0 0.0 - 0.1 [id number]  Basic metabolic panel     Status: Abnormal   Collection Time: 11/25/17  2:32 PM  Result Value Ref Range   Glucose 159 (H) 65 - 99 mg/dL   BUN 21 6 - 24 mg/dL   Creatinine, Ser 1.25 0.76 - 1.27 mg/dL   GFR calc non Af Amer 63 >59 mL/min/1.73   GFR calc Af Amer 73 >59 mL/min/1.73   BUN/Creatinine Ratio 17 9 - 20   Sodium 139 134 - 144 mmol/L   Potassium 5.8 (H) 3.5 - 5.2 mmol/L   Chloride 100 96 - 106 mmol/L   CO2 21 20 - 29 mmol/L   Calcium 9.9 8.7 - 10.2 mg/dL  TSH     Status: None   Collection Time: 11/25/17  2:32 PM  Result Value Ref Range   TSH 4.130 0.450 - 4.500 uIU/mL  Hepatic function panel     Status: Abnormal   Collection Time: 11/29/17 11:54 AM  Result Value Ref Range   Total Protein 6.8 6.5 - 8.1 g/dL   Albumin 3.7 3.5 - 5.0 g/dL   AST 22 15 - 41 U/L   ALT 17 17 - 63 U/L   Alkaline Phosphatase 76 38 - 126 U/L   Total Bilirubin 0.5 0.3 - 1.2 mg/dL   Bilirubin, Direct <0.1 (L) 0.1 - 0.5 mg/dL   Indirect Bilirubin NOT CALCULATED 0.3 - 0.9 mg/dL    Comment: Performed at [facility], [address] [place], [place] [address]  Lipid Profile     Status: Abnormal   Collection Time: 11/29/17 11:54 AM  Result Value Ref Range   Cholesterol 141 0 - 200 mg/dL   Triglycerides 130 <150 mg/dL   HDL 38 (L) >40 mg/dL   Total CHOL/HDL Ratio 3.7 RATIO   VLDL 26 0 - 40 mg/dL   LDL Cholesterol 77 0 - 99 mg/dL    Comment:        Total Cholesterol/HDL:CHD Risk Coronary Heart Disease Risk Table                     Men   Women  1/2 Average Risk   3.4   3.3  Average Risk       5.0   4.4  2 X Average Risk   9.6   7.1  3 X Average Risk  23.4   11.0        Use the calculated Patient Ratio above and the CHD Risk Table to determine the patient's CHD Risk.        ATP III CLASSIFICATION (LDL):  <100     mg/dL   Optimal  100-129  mg/dL   Near or Above                    Optimal  130-159   mg/dL   Borderline  160-189  mg/dL   High  >190     mg/dL   Very High Performed at [facility], [address] [place], [place] [address]   Basic metabolic panel     Status: None   Collection Time: 11/29/17 11:54 AM  Result Value Ref Range   Sodium 139 135 - 145 mmol/L   Potassium 4.5 3.5 - 5.1 mmol/L   Chloride 102 101 - 111 mmol/L   CO2  27 22 - 32 mmol/L   Glucose, Bld 93 65 - 99 mg/dL   BUN 16 6 - 20 mg/dL   Creatinine, Ser 1.12 0.61 - 1.24 mg/dL   Calcium 9.3 8.9 - 10.3 mg/dL   GFR calc non Af Amer >60 >60 mL/min   GFR calc Af Amer >60 >60 mL/min    Comment: (NOTE) The eGFR has been calculated using the CKD EPI equation. This calculation has not been validated in all clinical situations. eGFR's persistently <60 mL/min signify possible Chronic Kidney Disease.    Anion gap 10 5 - 15    Comment: Performed at [facility], [address] [place], [place] [address]  Hepatic function panel     Status: None   Collection Time: 01/17/18  8:19 AM  Result Value Ref Range   Total Protein 6.0 6.0 - 8.5 g/dL   Albumin 3.8 3.5 - 5.5 g/dL   Bilirubin Total 0.4 0.0 - 1.2 mg/dL   Bilirubin, Direct 0.12 0.00 - 0.40 mg/dL   Alkaline Phosphatase 87 39 - 117 IU/L   AST 24 0 - 40 IU/L   ALT 27 0 - 44 IU/L  Lipid Profile     Status: Abnormal   Collection Time: 01/17/18  8:19 AM  Result Value Ref Range   Cholesterol, Total 113 100 - 199 mg/dL   Triglycerides 92 0 - 149 mg/dL   HDL 33 (L) >39 mg/dL   VLDL Cholesterol Cal 18 5 - 40 mg/dL   LDL Calculated 62 0 - 99 mg/dL   Chol/HDL Ratio 3.4 0.0 - 5.0 ratio    Comment:                                   T. Chol/HDL Ratio                                             Men  Women                               1/2 Avg.Risk  3.4    3.3                                   Avg.Risk  5.0    4.4                                2X Avg.Risk  9.6    7.1                                3X Avg.Risk 23.4   11.0   ToxASSURE Select 13  (MW), Urine     Status: None   Collection Time: 01/17/18 12:46 PM  Result Value Ref Range   Summary FINAL     Comment: ==================================================================== TOXASSURE SELECT 13 (MW) ==================================================================== Test                             Result       Flag  Units Drug Present and Declared for Prescription Verification   Oxycodone                      679          EXPECTED   ng/mg creat   Oxymorphone                    796          EXPECTED   ng/mg creat   Noroxycodone                   2086         EXPECTED   ng/mg creat   Noroxymorphone                 350          EXPECTED   ng/mg creat    Sources of oxycodone are scheduled prescription medications.    Oxymorphone, noroxycodone, and noroxymorphone are expected    metabolites of oxycodone. Oxymorphone is also available as a    scheduled prescription medication. ==================================================================== Test                      Result    Flag   Units      Ref Range   Creatinine              28               mg/dL      >=20 ====== ============================================================== Declared Medications:  The flagging and interpretation on this report are based on the  following declared medications.  Unexpected results may arise from  inaccuracies in the declared medications.  **Note: The testing scope of this panel includes these medications:  Oxycodone  **Note: The testing scope of this panel does not include following  reported medications:  Albuterol  Aspirin  Carvedilol  Cyanocobalamin  Cyclobenzaprine  Dexlansoprazole  Ezetimibe  Fluticasone  Gabapentin  Insulin (Humalog)  Insulin glargine  Liraglutide  Loratadine  Meclizine  Metformin  Montelukast  Mycophenolate mofetil  Naloxone  Nitroglycerin  Pyridostigmine  Rosuvastatin  Triamcinolone  acetonide ==================================================================== For clinical consultation, please call [phone]. ====================================================================   POCT HgB A1C     Status: Abnormal   Collection Time: 01/25/18 11:59 AM  Result Value Ref Range   Hemoglobin A1C 7.2   POCT UA - Microalbumin     Status: Normal   Collection Time: 01/25/18 11:59 AM  Result Value Ref Range   Microalbumin Ur, POC Neg mg/L   Creatinine, POC  mg/dL   Albumin/Creatinine Ratio, Urine, POC      Diabetic Foot Exam: Diabetic Foot Exam - Simple   Simple Foot Form Diabetic Foot exam was performed with the following findings:  Yes 01/25/2018 12:46 PM  Visual Inspection No deformities, no ulcerations, no other skin breakdown bilaterally:  Yes Sensation Testing See comments:  Yes Pulse Check Posterior Tibialis and Dorsalis pulse intact bilaterally:  Yes Comments Failed monofilament test       PHQ2/9: Depression screen [facility] 2/9 01/17/2018 11/10/2017 11/01/2017 10/07/2017 09/20/2017  Decreased Interest 0 0 0 0 0  Down, Depressed, Hopeless 0 0 0 0 0  PHQ - 2 Score 0 0 0 0 0    Fall Risk: Fall Risk  01/25/2018 01/17/2018 11/10/2017 11/01/2017 10/07/2017  Falls in the past year? No No No No No  Comment - - - - -  Number falls in  past yr: - - - - -  Comment - - - - -  Injury with Fall? - - - - -  Risk Factor Category  - - - - -  Risk for fall due to : - - - - -     Functional Status Survey: Is the patient deaf or have difficulty hearing?: No Does the patient have difficulty seeing, even when wearing glasses/contacts?: No Does the patient have difficulty concentrating, remembering, or making decisions?: No Does the patient have difficulty walking or climbing stairs?: Yes Does the patient have difficulty dressing or bathing?: No Does the patient have difficulty doing errands alone such as visiting a doctor's office or shopping?: No    Assessment & Plan  1.  Type 2 diabetes mellitus with diabetic polyneuropathy, with long-term current use of insulin (HCC)  Seeing endocrinologist but no recent labs, change in providers. hgbA1C is slightly higher at 7.2, however patient recently took Prednisone - POCT HgB A1C - POCT UA - Microalbumin  2. Encounter for screening for HIV  Not interested  3. Need for hepatitis C screening test  - Hepatitis C antibody  4. Need for vaccination for pneumococcus  - Pneumococcal polysaccharide vaccine 23-valent greater than or equal to 2yo subcutaneous/IM  5. Inflammatory spondylopathy of lumbosacral region [facility])  Seeing pain clinic   6. Morbid (severe) obesity due to excess calories [facility])  Discussed with the patient the risk posed by an increased BMI. Discussed importance of portion control, calorie counting and at least 150 minutes of physical activity weekly. Avoid sweet beverages and drink more water. Eat at least 6 servings of fruit and vegetables daily   7. Moderate persistent asthma without complication  - DG Chest 2 View; Future  8. Myasthenia gravis ([place])  On medication, sees Dr. Melrose Nakayama  9. Essential hypertension  At goal today   10. Coronary artery disease involving native coronary artery of native heart with angina pectoris ([place])  Continue medications and follow up with cardiologist   11. Wheezing on auscultation  - DG Chest 2 View; Future

## 2018-01-26 LAB — HEPATITIS C ANTIBODY
HEP C AB: NONREACTIVE
SIGNAL TO CUT-OFF: 0.01 (ref <1.00)

## 2018-01-27 ENCOUNTER — Ambulatory Visit
Admission: RE | Admit: 2018-01-27 | Discharge: 2018-01-27 | Disposition: A | Discharge status: Home / Self Care | Payer: Medicare Other | Source: Ambulatory Visit | Attending: Family Medicine | Admitting: Family Medicine

## 2018-01-27 DIAGNOSIS — R05 Cough: Secondary | ICD-10-CM | POA: Diagnosis not present

## 2018-01-27 DIAGNOSIS — J4540 Moderate persistent asthma, uncomplicated: Secondary | ICD-10-CM

## 2018-01-27 DIAGNOSIS — R062 Wheezing: Secondary | ICD-10-CM

## 2018-01-31 ENCOUNTER — Other Ambulatory Visit: Payer: Self-pay

## 2018-01-31 ENCOUNTER — Other Ambulatory Visit: Payer: Self-pay | Admitting: Cardiovascular Disease

## 2018-02-08 ENCOUNTER — Encounter: Payer: Self-pay | Admitting: Nurse Practitioner

## 2018-02-08 ENCOUNTER — Ambulatory Visit (INDEPENDENT_AMBULATORY_CARE_PROVIDER_SITE_OTHER): Payer: Medicare Other | Admitting: Nurse Practitioner

## 2018-02-08 VITALS — BP 134/76 | HR 82 | Temp 98.1°F | Resp 18 | Ht 67.0 in | Wt 228.0 lb

## 2018-02-08 DIAGNOSIS — J4541 Moderate persistent asthma with (acute) exacerbation: Secondary | ICD-10-CM | POA: Diagnosis not present

## 2018-02-08 DIAGNOSIS — J302 Other seasonal allergic rhinitis: Secondary | ICD-10-CM | POA: Diagnosis not present

## 2018-02-08 DIAGNOSIS — R059 Cough, unspecified: Secondary | ICD-10-CM

## 2018-02-08 DIAGNOSIS — R05 Cough: Secondary | ICD-10-CM | POA: Diagnosis not present

## 2018-02-08 MED ORDER — LEVOCETIRIZINE DIHYDROCHLORIDE 5 MG PO TABS: 5.0000 mg | ORAL_TABLET | Freq: Every evening | ORAL | 3 refills | Status: DC

## 2018-02-08 MED ORDER — PREDNISONE 10 MG (21) PO TBPK: ORAL_TABLET | ORAL | 0 refills | Status: DC

## 2018-02-08 MED ORDER — BENZONATATE 200 MG PO CAPS: 200.0000 mg | ORAL_CAPSULE | Freq: Two times a day (BID) | ORAL | 0 refills | Status: DC | PRN

## 2018-02-08 MED ORDER — FLUTICASONE FUROATE-VILANTEROL 200-25 MCG/INH IN AEPB: 1.0000 | INHALATION_SPRAY | Freq: Every day | RESPIRATORY_TRACT | 2 refills | Status: DC

## 2018-02-08 NOTE — Progress Notes (Signed)
Name: [name]   MRN: [id number]    DOB: 10/20/1960   Date:02/08/2018       Progress Note  Subjective  Chief Complaint  Chief Complaint  Patient presents with  . URI    cough, congested for 1 month. Seen Dr. Raul Del and was given antibiotics and steroids    HPI  Patient presents to clinic with illness ongoing for over a month. First symptom was cough, shortness of breath, wheezing Cough: dry, strong, worse at night  Endorses right ear pain and muffles sounds, nasal congestions, voice change, subjective chills, sore throat, cp with coughiing. Denies facial pain, Ear discharge, muffled sounds, nausea, vomiting, palpitations.  maintenance asthma/allergy medications- Singulair 46m, loratadine daily, flovent BID, albuterol inhaler- taking it 4 times a day and neb tx- every other day.  Saw pulmonology- Dr. FVella Kohleron 4/11 started pt on prednisone, flonase and ceftin 5077mBID Seen in office- Dr. SoAncil BoozerURI symptoms improving but still present; chest xray completed- no active disease noted.   Patient Active Problem List   Diagnosis Date Noted  . 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HCMcCausland04/23/2019  . Hyperlipidemia due to type 2 diabetes mellitus (HCPoole11/05/2017  . Vitamin D deficiency, unspecified 08/12/2017  . Ptosis of left eyelid 01/08/2017  . Dermatitis 12/24/2016  . Difficulty walking 04/27/2016  . Foot cramps 04/27/2016  . Morbid (severe) obesity due to excess calories (HCRapid City07/12/2015  . Benign neoplasm of sigmoid colon   . Benign neoplasm of descending colon   . Benign neoplasm of transverse colon   . Coronary artery disease involving native coronary artery with angina pectoris (HCStearns01/17/2017  . Coronary artery disease 08/22/2015  . DM (diabetes mellitus), type 2, uncontrolled with complications (HCPawleys [id number]. Myasthenia gravis (HCFort Ritchie11/17/2016  . Chronic left-sided low back pain 08/22/2015  . Hypertension 08/22/2015  . GERD (gastroesophageal  reflux disease) 08/22/2015  . Arthritis 08/22/2015  . Diabetic peripheral neuropathy (HCJoplin11/17/2016  . Lyme disease 08/22/2015  . Hyperlipidemia 08/22/2015  . Insomnia 08/22/2015  . Type 2 diabetes mellitus with diabetic polyneuropathy, with long-term current use of insulin (HCWaKeeney11/17/2016    Past Medical History:  Diagnosis Date  . Allergy    dust, seasonal (worse in the fall).  . Anginal pain (HCC)    1 OR 2 x IN 3 MONTHS  . 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 Followed by Pain Specialist in [place], back and neck  . Asthma    BRONCHITIS  . Cataract    First Dx in 2012  . Coronary artery disease    a. Prior Ant MI->s/p multiple stents placed in the LAD and right coronary artery [place]: reportedly nonobs dzs;  c. 02/2017 MV: EF 45-54%, medium defect of severe severity in apical anterior, apical inf, and apex. + infarct, no ishcemia.  . Diabetes mellitus without complication (HCAshville   TYPE 2  . Diabetic peripheral neuropathy (HCC)    feet and hands  . GERD (gastroesophageal reflux disease)   . Headache    muscle tension  . Hyperlipidemia   . Hypertension    CONTROLLED ON MEDS  . Insomnia   . Lyme disease    Chronic  . Myasthenia gravis (HCNotasulga  . Myocardial infarction (HCPerham2010  . Seasonal allergies   . Sleep apnea    CPAP    Past Surgical History:  Procedure Laterality Date  . BILATERAL CARPAL TUNNEL RELEASE Bilateral L in 2012  and R in 2013  . CARDIAC CATHETERIZATION     Several Caths, most recent in  March 2016.  [name] COLONOSCOPY WITH PROPOFOL N/A 01/10/2016   Procedure: COLONOSCOPY WITH PROPOFOL;  Surgeon: [name], MD;  Location: [facility] ENDOSCOPY;  Service: Endoscopy;  Laterality: N/A;  . CORONARY ANGIOPLASTY    . ESOPHAGOGASTRODUODENOSCOPY (EGD) WITH PROPOFOL N/A 01/10/2016   Procedure: ESOPHAGOGASTRODUODENOSCOPY (EGD) WITH PROPOFOL;  Surgeon: [name], MD;  Location: [facility] ENDOSCOPY;  Service: Endoscopy;  Laterality: N/A;  . EYE SURGERY  Bilateral 2012   cataract/bilateral vitrectomies  . TONSILLECTOMY AND ADENOIDECTOMY     As a child  . TUNNELED VENOUS CATHETER PLACEMENT     removed    Social History   Tobacco Use  . Smoking status: Never Smoker  . Smokeless tobacco: Never Used  Substance Use Topics  . Alcohol use: Yes    Alcohol/week: 0.0 oz    Comment: occasional/1-2 BEERS PER YEAR     Current Outpatient Medications:  .  albuterol (PROVENTIL HFA;VENTOLIN HFA) 108 (90 Base) MCG/ACT inhaler, Inhale 2 puffs into the lungs every 6 (six) hours as needed for wheezing or shortness of breath., Disp: , Rfl:  .  albuterol (PROVENTIL) (2.5 MG/3ML) 0.083% nebulizer solution, Take 2.5 mg by nebulization every 6 (six) hours as needed for wheezing or shortness of breath., Disp: , Rfl:  .  aspirin 325 MG tablet, Take 325 mg by mouth daily. AM, Disp: , Rfl:  .  carvedilol (COREG) 3.125 MG tablet, TAKE 1 TABLET BY MOUTH TWICE DAILY, Disp: 180 tablet, Rfl: 3 .  cyanocobalamin 1000 MCG tablet, Take 1,000 mcg by mouth daily. am, Disp: , Rfl:  .  cyclobenzaprine (FLEXERIL) 10 MG tablet, Take 1 tablet (10 mg total) by mouth 2 (two) times daily., Disp: 60 tablet, Rfl: 5 .  dexlansoprazole (DEXILANT) 60 MG capsule, Take 1 capsule (60 mg total) by mouth daily., Disp: 30 capsule, Rfl: 6 .  ezetimibe (ZETIA) 10 MG tablet, Take 1 tablet (10 mg total) by mouth daily., Disp: 90 tablet, Rfl: 3 .  FLOVENT HFA 220 MCG/ACT inhaler, Inhale 2 puffs into the lungs 2 (two) times daily., Disp: , Rfl: 0 .  gabapentin (NEURONTIN) 300 MG capsule, Take 1 capsule (300 mg total) by mouth 3 (three) times daily., Disp: 90 capsule, Rfl: 1 .  gabapentin (NEURONTIN) 800 MG tablet, Take 1 tablet (800 mg total) by mouth at bedtime., Disp: 90 tablet, Rfl: 0 .  glucose blood (BAYER CONTOUR TEST) test strip, Use as instructed, Disp: 100 each, Rfl: 2 .  insulin lispro (HUMALOG KWIKPEN) 100 UNIT/ML KiwkPen, Inject 8 Units into the skin 3 (three) times daily. Reported on  12/02/2015/ sliding scale, Disp: , Rfl:  .  LANTUS SOLOSTAR 100 UNIT/ML Solostar Pen, Inject 33 Units into the skin daily at 10 pm. , Disp: , Rfl: 0 .  liraglutide (VICTOZA) 18 MG/3ML SOPN, Inject 18 mg into the skin daily. , Disp: , Rfl:  .  Loratadine (CLARITIN PO), Take by mouth daily. am, Disp: , Rfl:  .  metFORMIN (GLUCOPHAGE) 1000 MG tablet, Take 1,000 mg by mouth 2 (two) times daily with a meal., Disp: , Rfl:  .  montelukast (SINGULAIR) 10 MG tablet, every morning. , Disp: , Rfl: 1 .  mycophenolate (CELLCEPT) 250 MG capsule, wk1-2: 1tab nightly wk3-4:1tab 2xdaily wk5-6 1tab AM, 2tab PM wk7-8: 2tabs 2xdaily, Disp: , Rfl:  .  naloxone (NARCAN) nasal spray 4 mg/0.1 mL, For excess sedation from opioids, Disp: 1 kit, Rfl:  2 .  nitroGLYCERIN (NITROSTAT) 0.3 MG SL tablet, Place 1 tablet (0.3 mg total) under the tongue every 5 (five) minutes as needed for chest pain. Reported on 12/02/2015, Disp: 25 tablet, Rfl: 3 .  Oxycodone HCl 10 MG TABS, Take 1 tablet (10 mg total) by mouth 4 (four) times daily as needed., Disp: 120 tablet, Rfl: 0 .  pyridostigmine (MESTINON) 60 MG tablet, Take 60 mg by mouth 4 (four) times daily., Disp: , Rfl:  .  rosuvastatin (CRESTOR) 5 MG tablet, Take 5 mg by mouth daily., Disp: , Rfl: 0 .  triamcinolone cream (KENALOG) 0.1 %, apply to affected area twice a day UNTIL CLEAR, Disp: , Rfl: 0  Allergies  Allergen Reactions  . Metoprolol Rash  . Novolog [Insulin Aspart] Hives    ROS  No other specific complaints in a complete review of systems (except as listed in HPI above).  Objective  Vitals:   02/08/18 1450  BP: 134/76  Pulse: 82  Resp: 18  Temp: 98.1 F (36.7 C)  TempSrc: Oral  SpO2: 97%  Weight: 228 lb (103.4 kg)  Height: 5' 7" (1.702 m)    Body mass index is 35.71 kg/m.  Nursing Note and Vital Signs reviewed.  Physical Exam  Constitutional: Patient appears well-developed and well-nourished.  No distress.  HEENT: head atraumatic, normocephalic,  pupils equal and reactive to light, TM's without erythema or bulging- ear canal bilaterally has structural bulge- no, no maxillary or frontal sinus tenderness , neck supple without lymphadenopathy, oropharynx pink and moist without exudate, dried nasal discharge/  Cardiovascular: Normal rate, regular rhythm, S1/S2 present.  No murmur or rub heard.  Chest tenderness with palpation- no bruising or deformity noted.  Pulmonary/Chest: Effort normal and breath sounds expiratory and inspiratory wheezing throughout  No respiratory distress or retractions. Abdominal: Soft and non-tender, bowel sounds present  Psychiatric: Patient has a normal mood and affect. behavior is normal. Judgment and thought content normal.  No results found for this or any previous visit (from the past 72 hour(s)).  Assessment & Plan 1. Cough - musinex  - benzonatate (TESSALON) 200 MG capsule; Take 1 capsule (200 mg total) by mouth 2 (two) times daily as needed for cough.  Dispense: 20 capsule; Refill: 0  2. Moderate persistent asthma with acute exacerbation - albuterol neb every 8 hours for the next 3 days and then PRN - fluticasone furoate-vilanterol (BREO ELLIPTA) 200-25 MCG/INH AEPB; Inhale 1 puff into the lungs daily.  Dispense: 1 each; Refill: 2 - Ambulatory referral to Pulmonology - predniSONE (STERAPRED UNI-PAK 21 TAB) 10 MG (21) TBPK tablet; Follow package insert  Dispense: 21 tablet; Refill: 0  3. Seasonal allergies -continue flonase, Symbicort, avoid triggers  - levocetirizine (XYZAL) 5 MG tablet; Take 1 tablet (5 mg total) by mouth every evening.  Dispense: 30 tablet; Refill: 3  Spoke to Dr. Vella Kohler (pulmonologist) agreed with plan to switch to breo and increase albuterol. Recommended repeat prednisone taper and follow up with them. ER precautions discussed    -Red flags and when to present for emergency care or RTC including fever >101.66F, chest pain, shortness of breath, new/worsening/un-resolving symptoms,   reviewed with patient at time of visit. Follow up and care instructions discussed and provided in AVS.

## 2018-02-08 NOTE — Patient Instructions (Addendum)
- stop flonase, take breo - take albuterol neb every 8 hours for the next 2-3 days and then as needed again - switch from claritin to xyzal - prednisone taper  - take additional inhaler as needed- if you are requiring it every 4 hours still or having unrelieved shortness of breath or wheezing please get emergency care - follow up with pulm within the next 1-2 weeks   Asthma, Acute Bronchospasm Acute bronchospasm caused by asthma is also referred to as an asthma attack. Bronchospasm means your air passages become narrowed. The narrowing is caused by inflammation and tightening of the muscles in the air tubes (bronchi) in your lungs. This can make it hard to breathe or cause you to wheeze and cough. What are the causes? Possible triggers are:  Animal dander from the skin, hair, or feathers of animals.  Dust mites contained in house dust.  Cockroaches.  Pollen from trees or grass.  Mold.  Cigarette or tobacco smoke.  Air pollutants such as dust, household cleaners, hair sprays, aerosol sprays, paint fumes, strong chemicals, or strong odors.  Cold air or weather changes. Cold air may trigger inflammation. Winds increase molds and pollens in the air.  Strong emotions such as crying or laughing hard.  Stress.  Certain medicines such as aspirin or beta-blockers.  Sulfites in foods and drinks, such as dried fruits and wine.  Infections or inflammatory conditions, such as a flu, cold, or inflammation of the nasal membranes (rhinitis).  Gastroesophageal reflux disease (GERD). GERD is a condition where stomach acid backs up into your esophagus.  Exercise or strenuous activity.  What are the signs or symptoms?  Wheezing.  Excessive coughing, particularly at night.  Chest tightness.  Shortness of breath. How is this diagnosed? Your health care provider will ask you about your medical history and perform a physical exam. A chest X-ray or blood testing may be performed to look  for other causes of your symptoms or other conditions that may have triggered your asthma attack. How is this treated? Treatment is aimed at reducing inflammation and opening up the airways in your lungs. Most asthma attacks are treated with inhaled medicines. These include quick relief or rescue medicines (such as bronchodilators) and controller medicines (such as inhaled corticosteroids). These medicines are sometimes given through an inhaler or a nebulizer. Systemic steroid medicine taken by mouth or given through an IV tube also can be used to reduce the inflammation when an attack is moderate or severe. Antibiotic medicines are only used if a bacterial infection is present. Follow these instructions at home:  Rest.  Drink plenty of liquids. This helps the mucus to remain thin and be easily coughed up. Only use caffeine in moderation and do not use alcohol until you have recovered from your illness.  Do not smoke. Avoid being exposed to secondhand smoke.  You play a critical role in keeping yourself in good health. Avoid exposure to things that cause you to wheeze or to have breathing problems.  Keep your medicines up-to-date and available. Carefully follow your health care provider's treatment plan.  Take your medicine exactly as prescribed.  When pollen or pollution is bad, keep windows closed and use an air conditioner or go to places with air conditioning.  Asthma requires careful medical care. See your health care provider for a follow-up as advised. If you are more than [redacted] weeks pregnant and you were prescribed any new medicines, let your obstetrician know about the visit and how you are  doing. Follow up with your health care provider as directed.  After you have recovered from your asthma attack, make an appointment with your outpatient doctor to talk about ways to reduce the likelihood of future attacks. If you do not have a doctor who manages your asthma, make an appointment with a  primary care doctor to discuss your asthma. Get help right away if:  You are getting worse.  You have trouble breathing. If severe, call your local emergency services (911 in the [place]  You develop chest pain or discomfort.  You are vomiting.  You are not able to keep fluids down.  You are coughing up yellow, green, brown, or bloody sputum.  You have a fever and your symptoms suddenly get worse.  You have trouble swallowing. This information is not intended to replace advice given to you by your health care provider. Make sure you discuss any questions you have with your health care provider. Document Released: 01/06/2007 Document Revised: 03/04/2016 Document Reviewed: 03/29/2013 Elsevier Interactive Patient Education  2017 [employer].

## 2018-02-11 DIAGNOSIS — E1165 Type 2 diabetes mellitus with hyperglycemia: Secondary | ICD-10-CM | POA: Diagnosis not present

## 2018-02-11 DIAGNOSIS — Z794 Long term (current) use of insulin: Secondary | ICD-10-CM | POA: Diagnosis not present

## 2018-02-15 DIAGNOSIS — G4733 Obstructive sleep apnea (adult) (pediatric): Secondary | ICD-10-CM | POA: Diagnosis not present

## 2018-02-15 DIAGNOSIS — J4521 Mild intermittent asthma with (acute) exacerbation: Secondary | ICD-10-CM | POA: Diagnosis not present

## 2018-02-15 DIAGNOSIS — J310 Chronic rhinitis: Secondary | ICD-10-CM | POA: Diagnosis not present

## 2018-02-15 DIAGNOSIS — R05 Cough: Secondary | ICD-10-CM | POA: Diagnosis not present

## 2018-02-16 ENCOUNTER — Other Ambulatory Visit: Payer: Self-pay | Admitting: Nurse Practitioner

## 2018-02-16 ENCOUNTER — Telehealth: Payer: Self-pay | Admitting: Cardiovascular Disease

## 2018-02-16 ENCOUNTER — Telehealth: Payer: Self-pay | Admitting: Nurse Practitioner

## 2018-02-16 DIAGNOSIS — J4541 Moderate persistent asthma with (acute) exacerbation: Secondary | ICD-10-CM

## 2018-02-16 NOTE — Telephone Encounter (Signed)
No voice mail on phone

## 2018-02-16 NOTE — Telephone Encounter (Signed)
Patient calling to schedule an appt He had his BP checked at a doctor appt yesterday and it was 160/100 which he is concerned about Has scheduled to see [name] on 6/24 Please call

## 2018-02-16 NOTE — Telephone Encounter (Signed)
I left a message for the patient to call. 

## 2018-02-16 NOTE — Telephone Encounter (Signed)
Please Call ____ Received request from pharmacy for refill of prednisone taper- please see pulmonology to discuss if further refills are appropriate as you have completed two prednisone tapers

## 2018-02-17 NOTE — Telephone Encounter (Signed)
Patient returning call.

## 2018-02-18 ENCOUNTER — Telehealth: Payer: Self-pay

## 2018-02-18 NOTE — Telephone Encounter (Signed)
Spoke with patient and he reports that his blood pressures went from 108/80 to 125/90 and then up to 160's. He reports that he has had some problems with asthma and allergies. He also states that they put him on some prednisone as well. Instructed him to monitor his blood pressures at home and to keep a log of those readings so that we can see how they trend. Advised that if his blood pressures should be 140/90 or higher to please give [place] a call back and that we would see him at upcoming appointment. He verbalized understanding of our conversation, agreement with plan, and had no further questions at this time.

## 2018-02-18 NOTE — Telephone Encounter (Signed)
Pt is scheduled on 02/24/18 @ 10:00 to be seen by [facility] for AWV. This appt is needing to be rescheduled due to inadequate time to complete appt. Was hoping to reschedule this appt to 02/25/18 @ 3:00pm. LVM requesting returned call.

## 2018-02-18 NOTE — Telephone Encounter (Signed)
Will keep an eye on it

## 2018-02-24 ENCOUNTER — Ambulatory Visit: Payer: Self-pay

## 2018-02-24 DIAGNOSIS — I10 Essential (primary) hypertension: Secondary | ICD-10-CM | POA: Diagnosis not present

## 2018-02-24 DIAGNOSIS — E1159 Type 2 diabetes mellitus with other circulatory complications: Secondary | ICD-10-CM | POA: Diagnosis not present

## 2018-02-24 DIAGNOSIS — E1142 Type 2 diabetes mellitus with diabetic polyneuropathy: Secondary | ICD-10-CM | POA: Diagnosis not present

## 2018-02-24 DIAGNOSIS — E1169 Type 2 diabetes mellitus with other specified complication: Secondary | ICD-10-CM | POA: Diagnosis not present

## 2018-02-24 DIAGNOSIS — Z794 Long term (current) use of insulin: Secondary | ICD-10-CM | POA: Diagnosis not present

## 2018-02-25 ENCOUNTER — Ambulatory Visit (INDEPENDENT_AMBULATORY_CARE_PROVIDER_SITE_OTHER): Payer: Medicare Other

## 2018-02-25 VITALS — BP 126/78 | HR 103 | Temp 98.0°F | Resp 14 | Ht 67.0 in | Wt 230.9 lb

## 2018-02-25 DIAGNOSIS — Z0000 Encounter for general adult medical examination without abnormal findings: Secondary | ICD-10-CM | POA: Diagnosis not present

## 2018-02-25 NOTE — Patient Instructions (Signed)
[name] , Thank you for taking time to come for your Medicare Wellness Visit. I appreciate your ongoing commitment to your health goals. Please review the following plan we discussed and let me know if I can assist you in the future.   Screening recommendations/referrals: Colorectal Screening: Up to date Lung Cancer Screening: You do not qualify for this screening Hepatitis C Screening: Up to date  Vision and Dental Exams: Recommended annual ophthalmology exams for early detection of glaucoma and other disorders of the eye Recommended annual dental exams for proper oral hygiene  Diabetic Exams: Recommended annual diabetic eye exams for early detection of retinopathy Recommended annual diabetic foot exams for early detection of peripheral neuropathy.  Diabetic Eye Exam: Up to date Diabetic Foot Exam: Up to date  Vaccinations: Influenza vaccine: Up to date Pneumococcal vaccine: Not yet required Tdap vaccine: Up to date Shingles vaccine: Declined. Please call your insurance company to determine your out of pocket expense for the Shingrix vaccine. You may also receive this vaccine at your local pharmacy or Health Dept.  Advanced directives: Advance directive discussed with you today. I have provided a copy for you to complete at home and have notarized. Once this is complete please bring a copy in to our office so we can scan it into your chart.  Conditions/risks identified: Recommend to drink at least 6-8 8oz glasses of water per day.  Next appointment: Please schedule your Annual Wellness Visit with your Nurse Health Advisor in one year.  Preventive Care 40-64 Years, Male Preventive care refers to lifestyle choices and visits with your health care provider that can promote health and wellness. What does preventive care include?  A yearly physical exam. This is also called an annual well check.  Dental exams once or twice a year.  Routine eye exams. Ask your health care provider  how often you should have your eyes checked.  Personal lifestyle choices, including:  Daily care of your teeth and gums.  Regular physical activity.  Eating a healthy diet.  Avoiding tobacco and drug use.  Limiting alcohol use.  Practicing safe sex.  Taking low-dose aspirin every day starting at age 70. What happens during an annual well check? The services and screenings done by your health care provider during your annual well check will depend on your age, overall health, lifestyle risk factors, and family history of disease. Counseling  Your health care provider may ask you questions about your:  Alcohol use.  Tobacco use.  Drug use.  Emotional well-being.  Home and relationship well-being.  Sexual activity.  Eating habits.  Work and work Statistician. Screening  You may have the following tests or measurements:  Height, weight, and BMI.  Blood pressure.  Lipid and cholesterol levels. These may be checked every 5 years, or more frequently if you are over 31 years old.  Skin check.  Lung cancer screening. You may have this screening every year starting at age 48 if you have a 30-pack-year history of smoking and currently smoke or have quit within the past 15 years.  Fecal occult blood test (FOBT) of the stool. You may have this test every year starting at age 20.  Flexible sigmoidoscopy or colonoscopy. You may have a sigmoidoscopy every 5 years or a colonoscopy every 10 years starting at age 68.  Prostate cancer screening. Recommendations will vary depending on your family history and other risks.  Hepatitis C blood test.  Hepatitis B blood test.  Sexually transmitted disease (STD) testing.  Diabetes screening. This is done by checking your blood sugar (glucose) after you have not eaten for a while (fasting). You may have this done every 1-3 years. Discuss your test results, treatment options, and if necessary, the need for more tests with your health  care provider. Vaccines  Your health care provider may recommend certain vaccines, such as:  Influenza vaccine. This is recommended every year.  Tetanus, diphtheria, and acellular pertussis (Tdap, Td) vaccine. You may need a Td booster every 10 years.  Zoster vaccine. You may need this after age 53.  Pneumococcal 13-valent conjugate (PCV13) vaccine. You may need this if you have certain conditions and have not been vaccinated.  Pneumococcal polysaccharide (PPSV23) vaccine. You may need one or two doses if you smoke cigarettes or if you have certain conditions. Talk to your health care provider about which screenings and vaccines you need and how often you need them. This information is not intended to replace advice given to you by your health care provider. Make sure you discuss any questions you have with your health care provider. Document Released: 10/18/2015 Document Revised: 06/10/2016 Document Reviewed: 07/23/2015 Elsevier Interactive Patient Education  2017 [place] Prevention in the Home Falls can cause injuries. They can happen to people of all ages. There are many things you can do to make your home safe and to help prevent falls. What can I do on the outside of my home?  Regularly fix the edges of walkways and driveways and fix any cracks.  Remove anything that might make you trip as you walk through a door, such as a raised step or threshold.  Trim any bushes or trees on the path to your home.  Use bright outdoor lighting.  Clear any walking paths of anything that might make someone trip, such as rocks or tools.  Regularly check to see if handrails are loose or broken. Make sure that both sides of any steps have handrails.  Any raised decks and porches should have guardrails on the edges.  Have any leaves, snow, or ice cleared regularly.  Use sand or salt on walking paths during winter.  Clean up any spills in your garage right away. This includes oil  or grease spills. What can I do in the bathroom?  Use night lights.  Install grab bars by the toilet and in the tub and shower. Do not use towel bars as grab bars.  Use non-skid mats or decals in the tub or shower.  If you need to sit down in the shower, use a plastic, non-slip stool.  Keep the floor dry. Clean up any water that spills on the floor as soon as it happens.  Remove soap buildup in the tub or shower regularly.  Attach bath mats securely with double-sided non-slip rug tape.  Do not have throw rugs and other things on the floor that can make you trip. What can I do in the bedroom?  Use night lights.  Make sure that you have a light by your bed that is easy to reach.  Do not use any sheets or blankets that are too big for your bed. They should not hang down onto the floor.  Have a firm chair that has side arms. You can use this for support while you get dressed.  Do not have throw rugs and other things on the floor that can make you trip. What can I do in the kitchen?  Clean up any spills right away.  Avoid walking on wet floors.  Keep items that you use a lot in easy-to-reach places.  If you need to reach something above you, use a strong step stool that has a grab bar.  Keep electrical cords out of the way.  Do not use floor polish or wax that makes floors slippery. If you must use wax, use non-skid floor wax.  Do not have throw rugs and other things on the floor that can make you trip. What can I do with my stairs?  Do not leave any items on the stairs.  Make sure that there are handrails on both sides of the stairs and use them. Fix handrails that are broken or loose. Make sure that handrails are as long as the stairways.  Check any carpeting to make sure that it is firmly attached to the stairs. Fix any carpet that is loose or worn.  Avoid having throw rugs at the top or bottom of the stairs. If you do have throw rugs, attach them to the floor with  carpet tape.  Make sure that you have a light switch at the top of the stairs and the bottom of the stairs. If you do not have them, ask someone to add them for you. What else can I do to help prevent falls?  Wear shoes that:  Do not have high heels.  Have rubber bottoms.  Are comfortable and fit you well.  Are closed at the toe. Do not wear sandals.  If you use a stepladder:  Make sure that it is fully opened. Do not climb a closed stepladder.  Make sure that both sides of the stepladder are locked into place.  Ask someone to hold it for you, if possible.  Clearly mark and make sure that you can see:  Any grab bars or handrails.  First and last steps.  Where the edge of each step is.  Use tools that help you move around (mobility aids) if they are needed. These include:  Canes.  Walkers.  Scooters.  Crutches.  Turn on the lights when you go into a dark area. Replace any light bulbs as soon as they burn out.  Set up your furniture so you have a clear path. Avoid moving your furniture around.  If any of your floors are uneven, fix them.  If there are any pets around you, be aware of where they are.  Review your medicines with your doctor. Some medicines can make you feel dizzy. This can increase your chance of falling. Ask your doctor what other things that you can do to help prevent falls. This information is not intended to replace advice given to you by your health care provider. Make sure you discuss any questions you have with your health care provider. Document Released: 07/18/2009 Document Revised: 02/27/2016 Document Reviewed: 10/26/2014 Elsevier Interactive Patient Education  2017 [employer].

## 2018-02-25 NOTE — Progress Notes (Addendum)
Subjective:   [name] is a 57 y.o. male who presents for an Initial Medicare Annual Wellness Visit.  Review of Systems  N/A Cardiac Risk Factors include: advanced age (>75men, >56 women);dyslipidemia;hypertension;male gender;obesity (BMI >30kg/m2)    Objective:    Today's Vitals   02/25/18 1457  BP: 126/78  Pulse: (!) 103  Resp: 14  Temp: 98 F (36.7 C)  TempSrc: Oral  SpO2: 95%  Weight: 230 lb 14.4 oz (104.7 kg)  Height: 5\' 7"  (1.702 m)   Body mass index is 36.16 kg/m.  Advanced Directives 02/25/2018 11/01/2017 09/20/2017 07/14/2017 07/07/2017 06/16/2017 05/10/2017  Does Patient Have a Medical Advance Directive? No Yes Yes Yes No No Yes  Type of Advance Directive - Living will;Healthcare Power of Attorney Living will;Healthcare Power of [place] will  Does patient want to make changes to medical advance directive? Yes (MAU/Ambulatory/Procedural Areas - Information given) - - - - - -  Copy of Healthcare Power of Attorney in Chart? - - - - - - -  Would patient like information on creating a medical advance directive? - - - - - - -    Current Medications (verified) Outpatient Encounter Medications as of 02/25/2018  Medication Sig  . albuterol (PROVENTIL HFA;VENTOLIN HFA) 108 (90 Base) MCG/ACT inhaler Inhale 2 puffs into the lungs every 6 (six) hours as needed for wheezing or shortness of breath.  [name] albuterol (PROVENTIL) (2.5 MG/3ML) 0.083% nebulizer solution Take 2.5 mg by nebulization every 6 (six) hours as needed for wheezing or shortness of breath.  [name] aspirin 325 MG tablet Take 325 mg by mouth daily. AM  . carvedilol (COREG) 3.125 MG tablet TAKE 1 TABLET BY MOUTH TWICE DAILY  . cyanocobalamin 1000 MCG tablet Take 1,000 mcg by mouth daily. am  . cyclobenzaprine (FLEXERIL) 10 MG tablet Take 1 tablet (10 mg total) by mouth 2 (two) times daily.  [name] dexlansoprazole (DEXILANT) 60 MG capsule Take 1 capsule (60 mg total) by mouth daily.  [name] ezetimibe (ZETIA) 10 MG  tablet Take 1 tablet (10 mg total) by mouth daily.  . fluticasone furoate-vilanterol (BREO ELLIPTA) 200-25 MCG/INH AEPB Inhale 1 puff into the lungs daily.  [name] gabapentin (NEURONTIN) 300 MG capsule Take 1 capsule (300 mg total) by mouth 3 (three) times daily.  [name] gabapentin (NEURONTIN) 800 MG tablet Take 1 tablet (800 mg total) by mouth at bedtime.  [name] glucose blood (BAYER CONTOUR TEST) test strip Use as instructed  . insulin lispro (HUMALOG KWIKPEN) 100 UNIT/ML KiwkPen Inject 8 Units into the skin 3 (three) times daily. Reported on 12/02/2015/ sliding scale  . Insulin Pen Needle (FIFTY50 PEN NEEDLES) 32G X 4 MM MISC Inject 1 each into the skin as needed.  [name] LANTUS SOLOSTAR 100 UNIT/ML Solostar Pen Inject 33 Units into the skin daily at 10 pm.   . levocetirizine (XYZAL) 5 MG tablet Take 1 tablet (5 mg total) by mouth every evening.  . liraglutide (VICTOZA) 18 MG/3ML SOPN Inject 18 mg into the skin daily.   . Loratadine (CLARITIN PO) Take by mouth daily. am  . metFORMIN (GLUCOPHAGE) 1000 MG tablet Take 1,000 mg by mouth 2 (two) times daily with a meal.  . montelukast (SINGULAIR) 10 MG tablet every morning.   . mycophenolate (CELLCEPT) 250 MG capsule wk1-2: 1tab nightly wk3-4:1tab 2xdaily wk5-6 1tab AM, 2tab PM wk7-8: 2tabs 2xdaily  . naloxone (NARCAN) nasal spray 4 mg/0.1 mL For excess sedation from opioids  . nitroGLYCERIN (NITROSTAT) 0.3 MG  SL tablet Place 1 tablet (0.3 mg total) under the tongue every 5 (five) minutes as needed for chest pain. Reported on 12/02/2015  . Oxycodone HCl 10 MG TABS Take 1 tablet (10 mg total) by mouth 4 (four) times daily as needed.  . predniSONE (STERAPRED UNI-PAK 21 TAB) 10 MG (21) TBPK tablet Follow package insert  . pyridostigmine (MESTINON) 60 MG tablet Take 60 mg by mouth 4 (four) times daily.  . rosuvastatin (CRESTOR) 5 MG tablet Take 5 mg by mouth daily.  [name] triamcinolone cream (KENALOG) 0.1 % apply to affected area twice a day UNTIL CLEAR  . benzonatate  (TESSALON) 200 MG capsule Take 1 capsule (200 mg total) by mouth 2 (two) times daily as needed for cough.   No facility-administered encounter medications on file as of 02/25/2018.     Allergies (verified) Metoprolol and Novolog [insulin aspart]   History: Past Medical History:  Diagnosis Date  . Allergy    dust, seasonal (worse in the fall).  . Anginal pain (HCC)    1 OR 2 x IN 3 MONTHS  . 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 Followed by Pain Specialist in [place], back and neck  . Asthma    BRONCHITIS  . Cataract    First Dx in 2012  . Coronary artery disease    a. Prior Ant MI->s/p multiple stents placed in the LAD and right coronary artery (10 stents in [place]); b. 2016 Cath: reportedly nonobs dzs;  c. 02/2017 MV: EF 45-54%, medium defect of severe severity in apical anterior, apical inf, and apex. + infarct, no ishcemia.  . Diabetes mellitus without complication ([place])    TYPE 2  . Diabetic peripheral neuropathy (HCC)    feet and hands  . GERD (gastroesophageal reflux disease)   . Headache    muscle tension  . Hyperlipidemia   . Hypertension    CONTROLLED ON MEDS  . Insomnia   . Lyme disease    Chronic  . Myasthenia gravis ([place])   . Myocardial infarction ([place]) 2010  . Seasonal allergies   . Sleep apnea    CPAP   Past Surgical History:  Procedure Laterality Date  . BILATERAL CARPAL TUNNEL RELEASE Bilateral L in 2012 and R in 2013  . CARDIAC CATHETERIZATION     Several Caths, most recent in  March 2016.  [name] COLONOSCOPY WITH PROPOFOL N/A 01/10/2016   Procedure: COLONOSCOPY WITH PROPOFOL;  Surgeon: [name], MD;  Location: [facility] ENDOSCOPY;  Service: Endoscopy;  Laterality: N/A;  . CORONARY ANGIOPLASTY    . ESOPHAGOGASTRODUODENOSCOPY (EGD) WITH PROPOFOL N/A 01/10/2016   Procedure: ESOPHAGOGASTRODUODENOSCOPY (EGD) WITH PROPOFOL;  Surgeon: [name], MD;  Location: [facility] ENDOSCOPY;  Service: Endoscopy;  Laterality: N/A;  . EYE SURGERY Bilateral 2012   cataract/bilateral  vitrectomies  . TONSILLECTOMY AND ADENOIDECTOMY     As a child  . TUNNELED VENOUS CATHETER PLACEMENT     removed   Family History  Problem Relation Age of Onset  . Diabetes Mother   . Heart disease Mother   . Cancer Father        Prostate CA  . Diabetes Brother   . Healthy Brother   . Healthy Brother    Social History   Socioeconomic History  . Marital status: Married    Spouse name: Neoma Laming  . Number of children: 0  . Years of education: Not on file  . Highest education level: Master's degree (e.g., MA, MS, MEng, MEd, MSW, MBA)  Occupational History  .  Occupation: disabled  Social Needs  . Financial resource strain: Not hard at all  . Food insecurity:    Worry: Never true    Inability: Never true  . Transportation needs:    Medical: No    Non-medical: No  Tobacco Use  . Smoking status: Never Smoker  . Smokeless tobacco: Never Used  . Tobacco comment: smoking cessation materials not required  Substance and Sexual Activity  . Alcohol use: Not Currently    Alcohol/week: 0.0 oz  . Drug use: No  . Sexual activity: Never  Lifestyle  . Physical activity:    Days per week: 7 days    Minutes per session: 30 min  . Stress: Not at all  Relationships  . Social connections:    Talks on phone: Patient refused    Gets together: Patient refused    Attends religious service: Patient refused    Active member of club or organization: Patient refused    Attends meetings of clubs or organizations: Patient refused    Relationship status: Married  Other Topics Concern  . Not on file  Social History Narrative  . Not on file   Tobacco Counseling Counseling given: No Comment: smoking cessation materials not required  Clinical Intake:  Pre-visit preparation completed: Yes  Pain : No/denies pain   BMI - recorded: 36.16 Nutritional Status: BMI > 30  Obese Nutritional Risks: None Diabetes: Yes CBG done?: No CBG resulted in Enter/ Edit results?: No Did pt. bring in CBG  monitor from home?: No  How often do you need to have someone help you when you read instructions, pamphlets, or other written materials from your doctor or pharmacy?: 1 - Never  Interpreter Needed?: No  Information entered by :: AE[place]sole, LPN  Activities of Daily Living In your present state of health, do you have any difficulty performing the following activities: 02/25/2018 01/25/2018  Hearing? N N  Comment denies hearing aids -  Vision? N N  Comment wears eyeglasses -  Difficulty concentrating or making decisions? N N  Walking or climbing stairs? Y Y  Comment back pain and dyspnea -  Dressing or bathing? N N  Doing errands, shopping? N N  Preparing Food and eating ? N -  Comment denies dentures -  Using the Toilet? N -  In the past six months, have you accidently leaked urine? N -  Do you have problems with loss of bowel control? N -  Managing your Medications? N -  Managing your Finances? N -  Housekeeping or managing your Housekeeping? N -  Some recent data might be hidden     Immunizations and Health Maintenance Immunization History  Administered Date(s) Administered  . Influenza,inj,Quad PF,6+ Mos 06/16/2017  . Pneumococcal Polysaccharide-23 01/25/2018  . Td 04/04/2013   There are no preventive care reminders to display for this patient.  Patient Care Team: [name], MD as PCP - General (Family Medicine) [name], MD as PCP - Cardiology (Cardiology) [name], MD as Consulting Physician (Cardiology) Erby Pian, MD as Consulting Physician (Specialist) Molli Barrows, MD as Consulting Physician (Anesthesiology) Lonia Farber, MD as Consulting Physician (Internal Medicine) Anabel Bene, MD as Consulting Physician (Neurology)  Indicate any recent [place] you may have received from other than [facility] providers in the past year (date may be approximate).    Assessment:   This is a routine wellness examination for  [name].  Hearing/Vision screen Vision Screening Comments: Sees Dr. George Ina for  annual eye exams  Dietary issues and exercise activities discussed: Current Exercise Habits: Home exercise routine, Type of exercise: walking, Time (Minutes): 30, Frequency (Times/Week): 7, Weekly Exercise (Minutes/Week): 210, Intensity: Mild, Exercise limited by: None identified  Goals    . DIET - INCREASE WATER INTAKE     Recommend to drink at least 6-8 8oz glasses of water per day.      Depression Screen PHQ 2/9 Scores 02/25/2018 01/17/2018 11/10/2017 11/01/2017  PHQ - 2 Score 0 0 0 0  PHQ- 9 Score 0 - - -  Exception Documentation - - - -    Fall Risk Fall Risk  02/25/2018 01/25/2018 01/17/2018 11/10/2017 11/01/2017  Falls in the past year? Yes No No No No  Comment - - - - -  Number falls in past yr: 1 - - - -  Comment - - - - -  Injury with Fall? No - - - -  Risk Factor Category  - - - - -  Risk for fall due to : Impaired vision;History of fall(s);Other (Comment);Impaired balance/gait - - - -  Risk for fall due to: Comment myasthenia gravis; wears eyeglasses - - - -  Follow up Falls evaluation completed;Education provided;Falls prevention discussed - - - -    FALL RISK PREVENTION PERTAINING TO HOME: Is your home free of loose throw rugs in walkways, pet beds, electrical cords, etc? Yes Is there adequate lighting in your home to reduce risk of falls?  Yes Are there stairs in or around your home WITH handrails? Yes  ASSISTIVE DEVICES UTILIZED TO PREVENT FALLS: Use of a cane, walker or w/c? Yes, cane Grab bars in the bathroom? Yes  Shower chair or a place to sit while bathing? Yes An elevated toilet seat or a handicapped toilet? Yes  Timed Get Up and Go Performed: Yes. Pt ambulated 10 feet within 29 sec. Gait slow, slightly unsteady and without the use of an assistive device. No intervention required at this time. Fall risk prevention has been discussed.  Community Resource Referral:  [employer] Referral not required at this time.   Cognitive Function:     6CIT Screen 02/25/2018  What Year? 0 points  What month? 0 points  What time? 0 points  Count back from 20 0 points  Months in reverse 0 points  Repeat phrase 0 points  Total Score 0    Screening Tests Health Maintenance  Topic Date Due  . HIV Screening  02/09/2019 (Originally 06/28/1976)  . OPHTHALMOLOGY EXAM  03/24/2018  . INFLUENZA VACCINE  05/05/2018  . HEMOGLOBIN A1C  07/27/2018  . FOOT EXAM  01/26/2019  . URINE MICROALBUMIN  01/26/2019  . COLONOSCOPY  01/09/2021  . PNEUMOCOCCAL POLYSACCHARIDE VACCINE (2) 01/26/2023  . TETANUS/TDAP  04/05/2023  . Hepatitis C Screening  Completed    Qualifies for Shingles Vaccine? Yes. Due for Shingrix. Education has been provided regarding the importance of this vaccine. Pt has been advised to call his insurance company to determine his out of pocket expense. Advised he may also receive this vaccine at his local pharmacy or Health Dept. Verbalized acceptance and understanding.  Cancer Screenings: Lung: Low Dose CT Chest recommended if Age 79-80 years, 30 pack-year currently smoking OR have quit w/in 15years. Patient does not qualify. Colorectal: Completed 01/10/16. Repeat every 5 years.  Additional Screenings: Hepatitis C Screening: Completed 01/25/18    Plan:  I have personally reviewed and addressed the Medicare Annual Wellness questionnaire and have noted the following in the  patient's chart:  A. Medical and social history B. Use of alcohol, tobacco or illicit drugs  C. Current medications and supplements D. Functional ability and status E.  Nutritional status F.  Physical activity G. Advance directives H. List of other physicians I.  Hospitalizations, surgeries, and ER visits in previous 12 months J.  [place] such as hearing and vision if needed, cognitive and depression L. Referrals and appointments  In addition, I have reviewed and discussed  with patient certain preventive protocols, quality metrics, and best practice recommendations. A written personalized care plan for preventive services as well as general preventive health recommendations were provided to patient.  See attached scanned questionnaire for additional information.   Signed,  Aleatha Borer, LPN Nurse Health Advisor  I have reviewed this encounter including the documentation in this note and/or discussed this patient with the provider, Aleatha Borer, LPN. I am certifying that I agree with the content of this note as supervising physician.  [name], MD [facility] 02/27/2018, 9:06 AM

## 2018-03-01 DIAGNOSIS — H02402 Unspecified ptosis of left eyelid: Secondary | ICD-10-CM | POA: Diagnosis not present

## 2018-03-01 DIAGNOSIS — G7000 Myasthenia gravis without (acute) exacerbation: Secondary | ICD-10-CM | POA: Diagnosis not present

## 2018-03-02 ENCOUNTER — Other Ambulatory Visit: Payer: Self-pay | Admitting: Cardiovascular Disease

## 2018-03-04 ENCOUNTER — Other Ambulatory Visit: Payer: Self-pay | Admitting: Cardiovascular Disease

## 2018-03-04 NOTE — Telephone Encounter (Signed)
Medication refills sent in and will make provider aware of updates.

## 2018-03-04 NOTE — Telephone Encounter (Signed)
Pt calling to give [place] an update He states his BP is now back to normal He states it may have been caused by him being on steroids  He has now been off it and BP  Ranges from 110/75 to 148/80  He thinks he is better now   *STAT* If patient is at the pharmacy, call can be transferred to refill team.   1. Which medications need to be refilled? (please list name of each medication and dose if known)  zetia   2. Which pharmacy/location (including street and city if local pharmacy) is medication to be sent to? [facility] on [address] in [place]   3. Do they need a 30 day or 90 day supply?  90 day

## 2018-03-15 DIAGNOSIS — E1165 Type 2 diabetes mellitus with hyperglycemia: Secondary | ICD-10-CM | POA: Diagnosis not present

## 2018-03-15 DIAGNOSIS — Z794 Long term (current) use of insulin: Secondary | ICD-10-CM | POA: Diagnosis not present

## 2018-03-17 ENCOUNTER — Encounter: Payer: Self-pay | Admitting: Anesthesiology

## 2018-03-17 ENCOUNTER — Ambulatory Visit: Payer: Medicare Other | Attending: Anesthesiology | Admitting: Anesthesiology

## 2018-03-17 ENCOUNTER — Other Ambulatory Visit: Payer: Self-pay

## 2018-03-17 VITALS — BP 158/96 | HR 104 | Temp 97.8°F | Resp 16 | Ht 68.0 in | Wt 225.0 lb

## 2018-03-17 DIAGNOSIS — M545 Low back pain: Secondary | ICD-10-CM | POA: Diagnosis present

## 2018-03-17 DIAGNOSIS — M4716 Other spondylosis with myelopathy, lumbar region: Secondary | ICD-10-CM | POA: Diagnosis not present

## 2018-03-17 DIAGNOSIS — Z79899 Other long term (current) drug therapy: Secondary | ICD-10-CM | POA: Diagnosis not present

## 2018-03-17 DIAGNOSIS — G894 Chronic pain syndrome: Secondary | ICD-10-CM | POA: Diagnosis not present

## 2018-03-17 DIAGNOSIS — G7000 Myasthenia gravis without (acute) exacerbation: Secondary | ICD-10-CM | POA: Diagnosis not present

## 2018-03-17 DIAGNOSIS — Z7982 Long term (current) use of aspirin: Secondary | ICD-10-CM | POA: Insufficient documentation

## 2018-03-17 DIAGNOSIS — M5106 Intervertebral disc disorders with myelopathy, lumbar region: Secondary | ICD-10-CM | POA: Diagnosis not present

## 2018-03-17 DIAGNOSIS — M47817 Spondylosis without myelopathy or radiculopathy, lumbosacral region: Secondary | ICD-10-CM | POA: Diagnosis not present

## 2018-03-17 DIAGNOSIS — M542 Cervicalgia: Secondary | ICD-10-CM | POA: Insufficient documentation

## 2018-03-17 DIAGNOSIS — F1190 Opioid use, unspecified, uncomplicated: Secondary | ICD-10-CM | POA: Diagnosis not present

## 2018-03-17 DIAGNOSIS — M5431 Sciatica, right side: Secondary | ICD-10-CM

## 2018-03-17 DIAGNOSIS — Z79891 Long term (current) use of opiate analgesic: Secondary | ICD-10-CM | POA: Diagnosis not present

## 2018-03-17 DIAGNOSIS — Z794 Long term (current) use of insulin: Secondary | ICD-10-CM | POA: Diagnosis not present

## 2018-03-17 DIAGNOSIS — M5136 Other intervertebral disc degeneration, lumbar region: Secondary | ICD-10-CM | POA: Diagnosis not present

## 2018-03-17 MED ORDER — OXYCODONE HCL 10 MG PO TABS: 10.0000 mg | ORAL_TABLET | Freq: Four times a day (QID) | ORAL | 0 refills | Status: DC | PRN

## 2018-03-17 NOTE — Patient Instructions (Signed)
Facet Blocks Patient Information  Description: The facets are joints in the spine between the vertebrae.  Like any joints in the body, facets can become irritated and painful.  Arthritis can also effect the facets.  By injecting steroids and local anesthetic in and around these joints, we can temporarily block the nerve supply to them.  Steroids act directly on irritated nerves and tissues to reduce selling and inflammation which often leads to decreased pain.  Facet blocks may be done anywhere along the spine from the neck to the low back depending upon the location of your pain.   After numbing the skin with local anesthetic (like Novocaine), a small needle is passed onto the facet joints under x-ray guidance.  You may experience a sensation of pressure while this is being done.  The entire block usually lasts about 15-25 minutes.   Conditions which may be treated by facet blocks:   Low back/buttock pain  Neck/shoulder pain  Certain types of headaches  Preparation for the injection:  1. Do not eat any solid food or dairy products within 8 hours of your appointment. 2. You may drink clear liquid up to 3 hours before appointment.  Clear liquids include water, black coffee, juice or soda.  No milk or cream please. 3. You may take your regular medication, including pain medications, with a sip of water before your appointment.  Diabetics should hold regular insulin (if taken separately) and take 1/2 normal NPH dose the morning of the procedure.  Carry some sugar containing items with you to your appointment. 4. A driver must accompany you and be prepared to drive you home after your procedure. 5. Bring all your current medications with you. 6. An IV may be inserted and sedation may be given at the discretion of the physician. 7. A blood pressure cuff, EKG and other monitors will often be applied during the procedure.  Some patients may need to have extra oxygen administered for a short  period. 8. You will be asked to provide medical information, including your allergies and medications, prior to the procedure.  We must know immediately if you are taking blood thinners (like Coumadin/Warfarin) or if you are allergic to IV iodine contrast (dye).  We must know if you could possible be pregnant.  Possible side-effects:   Bleeding from needle site  Infection (rare, may require surgery)  Nerve injury (rare)  Numbness & tingling (temporary)  Difficulty urinating (rare, temporary)  Spinal headache (a headache worse with upright posture)  Light-headedness (temporary)  Pain at injection site (serveral days)  Decreased blood pressure (rare, temporary)  Weakness in arm/leg (temporary)  Pressure sensation in back/neck (temporary)   Call if you experience:   Fever/chills associated with headache or increased back/neck pain  Headache worsened by an upright position  New onset, weakness or numbness of an extremity below the injection site  Hives or difficulty breathing (go to the emergency room)  Inflammation or drainage at the injection site(s)  Severe back/neck pain greater than usual  New symptoms which are concerning to you  Please note:  Although the local anesthetic injected can often make your back or neck feel good for several hours after the injection, the pain will likely return. It takes 3-7 days for steroids to work.  You may not notice any pain relief for at least one week.  If effective, we will often do a series of 2-3 injections spaced 3-6 weeks apart to maximally decrease your pain.  After the initial   series, you may be a candidate for a more permanent nerve block of the facets.  If you have any questions, please call #[phone]) [facility]  Oxycodone prescription given x2.

## 2018-03-17 NOTE — Progress Notes (Signed)
Nursing Pain Medication Assessment:  Safety precautions to be maintained throughout the outpatient stay will include: orient to surroundings, keep bed in low position, maintain call bell within reach at all times, provide assistance with transfer out of bed and ambulation.  Medication Inspection Compliance: Pill count conducted under aseptic conditions, in front of the patient. Neither the pills nor the bottle was removed from the patient's sight at any time. Once count was completed pills were immediately returned to the patient in their original bottle.  Medication: Oxycodone IR Pill/Patch Count: 33 of 120 pills remain Pill/Patch Appearance: Markings consistent with prescribed medication Bottle Appearance: Standard pharmacy container. Clearly labeled. Filled Date: 05/ 21/ 2019 Last Medication intake:  Today

## 2018-03-19 NOTE — Progress Notes (Signed)
Subjective:  Patient ID: [name], male    DOB: 12-11-1960  Age: 57 y.o. MRN: [id number]  CC: Back Pain (low)   Procedure: None  HPI [name] presents for reevaluation.  He was last seen 2 months ago and had a previous epidural injection back in January.  He is beginning to have more consistent pain in the left lower back with radiation into the left posterior and lateral legs.  This is no longer as effectively control with his opioid medications.  In regards to that his pain relief with those has been good and based on his narcotic assessment sheet he continues to derive good functional lifestyle improvement with no side effects.  No change in lower extremity strength or function has been noted at this time and otherwise is been in his usual state of health.  He is tolerating his medications well.  Outpatient Medications Prior to Visit  Medication Sig Dispense Refill  . albuterol (PROVENTIL HFA;VENTOLIN HFA) 108 (90 Base) MCG/ACT inhaler Inhale 2 puffs into the lungs every 6 (six) hours as needed for wheezing or shortness of breath.    [name] albuterol (PROVENTIL) (2.5 MG/3ML) 0.083% nebulizer solution Take 2.5 mg by nebulization every 6 (six) hours as needed for wheezing or shortness of breath.    [name] aspirin 325 MG tablet Take 325 mg by mouth daily. AM    . carvedilol (COREG) 3.125 MG tablet TAKE 1 TABLET BY MOUTH TWICE DAILY 180 tablet 3  . cyanocobalamin 1000 MCG tablet Take 1,000 mcg by mouth daily. am    . cyclobenzaprine (FLEXERIL) 10 MG tablet Take 1 tablet (10 mg total) by mouth 2 (two) times daily. 60 tablet 5  . dexlansoprazole (DEXILANT) 60 MG capsule Take 1 capsule (60 mg total) by mouth daily. 30 capsule 6  . ezetimibe (ZETIA) 10 MG tablet Take 1 tablet (10 mg total) by mouth daily. 90 tablet 3  . fluticasone furoate-vilanterol (BREO ELLIPTA) 200-25 MCG/INH AEPB Inhale 1 puff into the lungs daily. 1 each 2  . gabapentin (NEURONTIN) 300 MG capsule Take 1 capsule (300 mg  total) by mouth 3 (three) times daily. 90 capsule 1  . gabapentin (NEURONTIN) 800 MG tablet Take 1 tablet (800 mg total) by mouth at bedtime. 90 tablet 0  . glucose blood (BAYER CONTOUR TEST) test strip Use as instructed 100 each 2  . insulin lispro (HUMALOG KWIKPEN) 100 UNIT/ML KiwkPen Inject 8 Units into the skin 3 (three) times daily. Reported on 12/02/2015/ sliding scale    . Insulin Pen Needle (FIFTY50 PEN NEEDLES) 32G X 4 MM MISC Inject 1 each into the skin as needed.    [name] LANTUS SOLOSTAR 100 UNIT/ML Solostar Pen Inject 33 Units into the skin daily at 10 pm.   0  . levocetirizine (XYZAL) 5 MG tablet Take 1 tablet (5 mg total) by mouth every evening. 30 tablet 3  . liraglutide (VICTOZA) 18 MG/3ML SOPN Inject 18 mg into the skin daily.     . Loratadine (CLARITIN PO) Take by mouth daily. am    . metFORMIN (GLUCOPHAGE) 1000 MG tablet Take 1,000 mg by mouth 2 (two) times daily with a meal.    . montelukast (SINGULAIR) 10 MG tablet every morning.   1  . naloxone (NARCAN) nasal spray 4 mg/0.1 mL For excess sedation from opioids 1 kit 2  . nitroGLYCERIN (NITROSTAT) 0.3 MG SL tablet Place 1 tablet (0.3 mg total) under the tongue every 5 (five) minutes as needed for  chest pain. Reported on 12/02/2015 25 tablet 3  . pyridostigmine (MESTINON) 60 MG tablet Take 60 mg by mouth 4 (four) times daily.    . rosuvastatin (CRESTOR) 5 MG tablet Take 5 mg by mouth daily.  0  . triamcinolone cream (KENALOG) 0.1 % apply to affected area twice a day UNTIL CLEAR  0  . Oxycodone HCl 10 MG TABS Take 1 tablet (10 mg total) by mouth 4 (four) times daily as needed. 120 tablet 0  . benzonatate (TESSALON) 200 MG capsule Take 1 capsule (200 mg total) by mouth 2 (two) times daily as needed for cough. (Patient not taking: Reported on 03/17/2018) 20 capsule 0  . mycophenolate (CELLCEPT) 250 MG capsule wk1-2: 1tab nightly wk3-4:1tab 2xdaily wk5-6 1tab AM, 2tab PM wk7-8: 2tabs 2xdaily    . predniSONE (STERAPRED UNI-PAK 21 TAB) 10 MG  (21) TBPK tablet Follow package insert (Patient not taking: Reported on 03/17/2018) 21 tablet 0   No facility-administered medications prior to visit.     Review of Systems CNS: No confusion or sedation Cardiac: No angina or palpitations GI: No abdominal pain or constipation Constitutional: No nausea vomiting fevers or chills  Objective:  BP (!) 158/96   Pulse (!) 104   Temp 97.8 F (36.6 C)   Resp 16   Ht '5\' 8"'$  (1.727 m)   Wt 225 lb (102.1 kg)   SpO2 98%   BMI 34.21 kg/m    BP Readings from Last 3 Encounters:  03/17/18 (!) 158/96  02/25/18 126/78  02/08/18 134/76     Wt Readings from Last 3 Encounters:  03/17/18 225 lb (102.1 kg)  02/25/18 230 lb 14.4 oz (104.7 kg)  02/08/18 228 lb (103.4 kg)     Physical Exam Pt is alert and oriented PERRL EOMI HEART IS RRR no murmur or rub LCTA no wheezing or rales MUSCULOSKELETAL reveals some paraspinous muscle tenderness with no overt trigger points and his strength is at baseline as is his muscle tone and bulk.  He does have some soreness in the left lower back with motion rotation and extension.  Labs  Lab Results  Component Value Date   HGBA1C 7.2 01/25/2018   HGBA1C 8.1 08/22/2015   Lab Results  Component Value Date   MICROALBUR Neg 01/25/2018   LDLCALC 62 01/17/2018   CREATININE 1.12 11/29/2017    -------------------------------------------------------------------------------------------------------------------- Lab Results  Component Value Date   WBC 6.8 11/25/2017   HGB 13.1 11/25/2017   HCT 40.7 11/25/2017   PLT 244 11/25/2017   GLUCOSE 93 11/29/2017   CHOL 113 01/17/2018   TRIG 92 01/17/2018   HDL 33 (L) 01/17/2018   LDLCALC 62 01/17/2018   ALT 27 01/17/2018   AST 24 01/17/2018   NA 139 11/29/2017   K 4.5 11/29/2017   CL 102 11/29/2017   CREATININE 1.12 11/29/2017   BUN 16 11/29/2017   CO2 27 11/29/2017   TSH 4.130 11/25/2017   HGBA1C 7.2 01/25/2018   MICROALBUR Neg 01/25/2018     --------------------------------------------------------------------------------------------------------------------- Dg Chest 2 View  Result Date: 01/27/2018 CLINICAL DATA:  Cough and wheezing for several months EXAM: CHEST - 2 VIEW COMPARISON:  None. FINDINGS: Cardiac shadow is within normal limits. Lungs are well aerated bilaterally. Old healed rib fractures are noted on the left. No focal infiltrate or sizable effusion is seen. Mild degenerative changes of the thoracic spine are noted. IMPRESSION: No active cardiopulmonary disease. Electronically Signed   By: Inez Catalina M.D.   On: 01/27/2018 16:30  Assessment & Plan:   Kendricks was seen today for back pain.  Diagnoses and all orders for this visit:  Cervicalgia  Facet arthritis of lumbosacral region -     LUMBAR FACET(MEDIAL BRANCH NERVE BLOCK) MBNB; Future  Chronic pain syndrome  Chronic, continuous use of opioids  DDD (degenerative disc disease), lumbar  Myasthenia gravis ([place])  Sciatica of right side  Lumbar spondylosis with myelopathy -     LUMBAR FACET(MEDIAL BRANCH NERVE BLOCK) MBNB; Future  Other orders -     Discontinue: Oxycodone HCl 10 MG TABS; Take 1 tablet (10 mg total) by mouth 4 (four) times daily as needed. -     Oxycodone HCl 10 MG TABS; Take 1 tablet (10 mg total) by mouth 4 (four) times daily as needed.        ----------------------------------------------------------------------------------------------------------------------  Problem List Items Addressed This Visit      Unprioritized   Myasthenia gravis ([place])    Other Visit Diagnoses    Cervicalgia    -  Primary   Facet arthritis of lumbosacral region       Relevant Medications   Oxycodone HCl 10 MG TABS   Other Relevant Orders   LUMBAR FACET(MEDIAL BRANCH NERVE BLOCK) MBNB   Chronic pain syndrome       Chronic, continuous use of opioids       DDD (degenerative disc disease), lumbar       Relevant Medications   Oxycodone HCl  10 MG TABS   Sciatica of right side       Lumbar spondylosis with myelopathy       Relevant Orders   LUMBAR FACET(MEDIAL BRANCH NERVE BLOCK) MBNB        ----------------------------------------------------------------------------------------------------------------------  1. Cervicalgia Continue with stretching strengthening exercises.  2. Facet arthritis of lumbosacral region Return to clinic in approximately a month or 2 for a repeat lumbar facet left side injection. - LUMBAR FACET(MEDIAL BRANCH NERVE BLOCK) MBNB; Future  3. Chronic pain syndrome Continue core stretching strengthening exercises.  4. Chronic, continuous use of opioids We have reviewed the [facility] practitioner database information and it is appropriate.  Refills are given for June 19 and July 19 for his oxycodone.  5. DDD (degenerative disc disease), lumbar As above  6. Myasthenia gravis ([place]) Continue follow-up with his primary care and neurology doctors.  7. Sciatica of right side   8. Lumbar spondylosis with myelopathy - LUMBAR FACET(MEDIAL BRANCH NERVE BLOCK) MBNB; Future    ----------------------------------------------------------------------------------------------------------------------  I am having [name] maintain his insulin lispro, pyridostigmine, Loratadine (CLARITIN PO), albuterol, glucose blood, gabapentin, gabapentin, metFORMIN, cyanocobalamin, aspirin, albuterol, montelukast, LANTUS SOLOSTAR, naloxone, liraglutide, nitroGLYCERIN, triamcinolone cream, cyclobenzaprine, rosuvastatin, ezetimibe, dexlansoprazole, mycophenolate, benzonatate, fluticasone furoate-vilanterol, levocetirizine, predniSONE, Insulin Pen Needle, carvedilol, and Oxycodone HCl.   Meds ordered this encounter  Medications  . DISCONTD: Oxycodone HCl 10 MG TABS    Sig: Take 1 tablet (10 mg total) by mouth 4 (four) times daily as needed.    Dispense:  120 tablet    Refill:  0    Do not fill until  [id number]  . Oxycodone HCl 10 MG TABS    Sig: Take 1 tablet (10 mg total) by mouth 4 (four) times daily as needed.    Dispense:  120 tablet    Refill:  0    Do not fill until [id number]   Patient's Medications  New Prescriptions   No medications on file  Previous Medications   ALBUTEROL (PROVENTIL HFA;VENTOLIN HFA) 108 (90  BASE) MCG/ACT INHALER    Inhale 2 puffs into the lungs every 6 (six) hours as needed for wheezing or shortness of breath.   ALBUTEROL (PROVENTIL) (2.5 MG/3ML) 0.083% NEBULIZER SOLUTION    Take 2.5 mg by nebulization every 6 (six) hours as needed for wheezing or shortness of breath.   ASPIRIN 325 MG TABLET    Take 325 mg by mouth daily. AM   BENZONATATE (TESSALON) 200 MG CAPSULE    Take 1 capsule (200 mg total) by mouth 2 (two) times daily as needed for cough.   CARVEDILOL (COREG) 3.125 MG TABLET    TAKE 1 TABLET BY MOUTH TWICE DAILY   CYANOCOBALAMIN 1000 MCG TABLET    Take 1,000 mcg by mouth daily. am   CYCLOBENZAPRINE (FLEXERIL) 10 MG TABLET    Take 1 tablet (10 mg total) by mouth 2 (two) times daily.   DEXLANSOPRAZOLE (DEXILANT) 60 MG CAPSULE    Take 1 capsule (60 mg total) by mouth daily.   EZETIMIBE (ZETIA) 10 MG TABLET    Take 1 tablet (10 mg total) by mouth daily.   FLUTICASONE FUROATE-VILANTEROL (BREO ELLIPTA) 200-25 MCG/INH AEPB    Inhale 1 puff into the lungs daily.   GABAPENTIN (NEURONTIN) 300 MG CAPSULE    Take 1 capsule (300 mg total) by mouth 3 (three) times daily.   GABAPENTIN (NEURONTIN) 800 MG TABLET    Take 1 tablet (800 mg total) by mouth at bedtime.   GLUCOSE BLOOD (BAYER CONTOUR TEST) TEST STRIP    Use as instructed   INSULIN LISPRO (HUMALOG KWIKPEN) 100 UNIT/ML KIWKPEN    Inject 8 Units into the skin 3 (three) times daily. Reported on 12/02/2015/ sliding scale   INSULIN PEN NEEDLE (FIFTY50 PEN NEEDLES) 32G X 4 MM MISC    Inject 1 each into the skin as needed.   LANTUS SOLOSTAR 100 UNIT/ML SOLOSTAR PEN    Inject 33 Units into the skin daily at 10 pm.     LEVOCETIRIZINE (XYZAL) 5 MG TABLET    Take 1 tablet (5 mg total) by mouth every evening.   LIRAGLUTIDE (VICTOZA) 18 MG/3ML SOPN    Inject 18 mg into the skin daily.    LORATADINE (CLARITIN PO)    Take by mouth daily. am   METFORMIN (GLUCOPHAGE) 1000 MG TABLET    Take 1,000 mg by mouth 2 (two) times daily with a meal.   MONTELUKAST (SINGULAIR) 10 MG TABLET    every morning.    MYCOPHENOLATE (CELLCEPT) 250 MG CAPSULE    wk1-2: 1tab nightly wk3-4:1tab 2xdaily wk5-6 1tab AM, 2tab PM wk7-8: 2tabs 2xdaily   NALOXONE (NARCAN) NASAL SPRAY 4 MG/0.1 ML    For excess sedation from opioids   NITROGLYCERIN (NITROSTAT) 0.3 MG SL TABLET    Place 1 tablet (0.3 mg total) under the tongue every 5 (five) minutes as needed for chest pain. Reported on 12/02/2015   PREDNISONE (STERAPRED UNI-PAK 21 TAB) 10 MG (21) TBPK TABLET    Follow package insert   PYRIDOSTIGMINE (MESTINON) 60 MG TABLET    Take 60 mg by mouth 4 (four) times daily.   ROSUVASTATIN (CRESTOR) 5 MG TABLET    Take 5 mg by mouth daily.   TRIAMCINOLONE CREAM (KENALOG) 0.1 %    apply to affected area twice a day UNTIL CLEAR  Modified Medications   Modified Medication Previous Medication   OXYCODONE HCL 10 MG TABS Oxycodone HCl 10 MG TABS      Take 1 tablet (10 mg total)  by mouth 4 (four) times daily as needed.    Take 1 tablet (10 mg total) by mouth 4 (four) times daily as needed.  Discontinued Medications   No medications on file   ----------------------------------------------------------------------------------------------------------------------  Follow-up: Return in about 2 months (around 05/17/2018) for evaluation.    Molli Barrows, MD

## 2018-03-28 ENCOUNTER — Ambulatory Visit: Payer: Medicare Other | Admitting: Nurse Practitioner

## 2018-03-28 ENCOUNTER — Encounter: Payer: Self-pay | Admitting: Nurse Practitioner

## 2018-03-28 VITALS — BP 130/82 | HR 102 | Ht 67.5 in | Wt 231.8 lb

## 2018-03-28 DIAGNOSIS — I25119 Atherosclerotic heart disease of native coronary artery with unspecified angina pectoris: Secondary | ICD-10-CM

## 2018-03-28 DIAGNOSIS — E785 Hyperlipidemia, unspecified: Secondary | ICD-10-CM | POA: Diagnosis not present

## 2018-03-28 DIAGNOSIS — R000 Tachycardia, unspecified: Secondary | ICD-10-CM | POA: Diagnosis not present

## 2018-03-28 DIAGNOSIS — I10 Essential (primary) hypertension: Secondary | ICD-10-CM | POA: Diagnosis not present

## 2018-03-28 MED ORDER — ASPIRIN EC 81 MG PO TBEC: 81.0000 mg | DELAYED_RELEASE_TABLET | Freq: Every day | ORAL | 3 refills | Status: AC

## 2018-03-28 NOTE — H&P (View-Only) (Signed)
Office Visit    Patient Name: [name] Date of Encounter: 03/28/2018  Primary Care Provider:  [name], MD Primary Cardiologist:  [name], MD  Chief Complaint    57 y/o ? with a h/o CAD s/p multiple PCI's to the LAD and RCA, HTN, HL, DMII, diabetic neuropathy, myasthenia gravis, and OSA, who presents for f/u related to variable BPs.  Past Medical History    Past Medical History:  Diagnosis Date  . Allergy    dust, seasonal (worse in the fall).  . Anginal pain (HCC)    1 OR 2 x IN 3 MONTHS  . 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 Followed by Pain Specialist in [place], back and neck  . Asthma    BRONCHITIS  . Cataract    First Dx in 2012  . Coronary artery disease    a. Prior Ant MI->s/p multiple stents placed in the LAD and right coronary artery (10 stents in [place]); b. 2016 Cath: reportedly nonobs dzs;  c. 02/2017 MV: EF 45-54%, medium defect of severe severity in apical anterior, apical inf, and apex. + infarct, no ishcemia.  . Diabetes mellitus without complication ([place])    TYPE 2  . Diabetic peripheral neuropathy (HCC)    feet and hands  . GERD (gastroesophageal reflux disease)   . Headache    muscle tension  . Hyperlipidemia   . Hypertension    CONTROLLED ON MEDS  . Insomnia   . Lyme disease    Chronic  . Myasthenia gravis ([place])   . Myocardial infarction ([place]) 2010  . Seasonal allergies   . Sleep apnea    CPAP   Past Surgical History:  Procedure Laterality Date  . BILATERAL CARPAL TUNNEL RELEASE Bilateral L in 2012 and R in 2013  . CARDIAC CATHETERIZATION     Several Caths, most recent in  March 2016.  [name] COLONOSCOPY WITH PROPOFOL N/A 01/10/2016   Procedure: COLONOSCOPY WITH PROPOFOL;  Surgeon: [name], MD;  Location: [facility] ENDOSCOPY;  Service: Endoscopy;  Laterality: N/A;  . CORONARY ANGIOPLASTY    . ESOPHAGOGASTRODUODENOSCOPY (EGD) WITH PROPOFOL N/A 01/10/2016   Procedure: ESOPHAGOGASTRODUODENOSCOPY (EGD) WITH PROPOFOL;  Surgeon: [name], MD;  Location: [facility] ENDOSCOPY;  Service: Endoscopy;  Laterality: N/A;  . EYE SURGERY Bilateral 2012   cataract/bilateral vitrectomies  . TONSILLECTOMY AND ADENOIDECTOMY     As a child  . TUNNELED VENOUS CATHETER PLACEMENT     removed    Allergies  Allergies  Allergen Reactions  . Metoprolol Rash  . Novolog [Insulin Aspart] Hives    History of Present Illness    57 year old ? with the above complex past medical history including coronary artery disease status post prior myocardial infarction in 2009 with subsequent placement of a total of 10 stents between the LAD and right coronary artery.  These were all performed in [place].  Last catheterization March 2016 reportedly showed no obstructive disease and he has been treated medically since.  Other history includes hypertension, hyperlipidemia, type 2 diabetes mellitus, diabetic neuropathy, myasthenia gravis, GERD, and sleep apnea on CPAP.     He has some degree of chronic angina, typically occurring a few times/month, usually @ rest, lasting a few mins, and resolving with rest.  Only on one occasion in the past few mos has he required a second sl ntg.  He is accustomed to experiencing this from time to time and has noted some increase in frequency.  Though he does have chronic  DOE @ higher levels of activity, such as walking up a hill, he does not typically experience exertional chest pain.  When I saw him in clinic in February, his BP was soft and I had him reduce his carvedilol to 1/2 of a 3.125mg  tab BID. I also obtained labs in the setting of sinus tachycardia - cbc, tsh wnl.  Following that visit, he visited [place] and while there, his BP was trending in the 160's to 170's.  He went back up on his coreg to 3.125mg  BID.  Since returning to [place], BP has been more stable, and typically in the 120's to 130's.  He has continued to note elevated HRs with resting rates typically in the high 90's to low 100's.     Home Medications    Prior to  Admission medications   Medication Sig Start Date End Date Taking? Authorizing Provider  albuterol (PROVENTIL HFA;VENTOLIN HFA) 108 (90 Base) MCG/ACT inhaler Inhale 2 puffs into the lungs every 6 (six) hours as needed for wheezing or shortness of breath.   Yes [provider]  albuterol (PROVENTIL) (2.5 MG/3ML) 0.083% nebulizer solution Take 2.5 mg by nebulization every 6 (six) hours as needed for wheezing or shortness of breath.   Yes [provider]  aspirin 325 MG tablet Take 325 mg by mouth daily. AM   Yes [provider]  carvedilol (COREG) 3.125 MG tablet TAKE 1 TABLET BY MOUTH TWICE DAILY 03/02/18  Yes [name], MD  cyanocobalamin 1000 MCG tablet Take 1,000 mcg by mouth daily. am   Yes [provider]  cyclobenzaprine (FLEXERIL) 10 MG tablet Take 1 tablet (10 mg total) by mouth 2 (two) times daily. 07/14/17  Yes Molli Barrows, MD  dexlansoprazole (DEXILANT) 60 MG capsule Take 1 capsule (60 mg total) by mouth daily. 12/06/17  Yes [name], MD  ezetimibe (ZETIA) 10 MG tablet Take 1 tablet (10 mg total) by mouth daily. 11/25/17 03/28/18 Yes Theora Gianotti, NP  fluticasone furoate-vilanterol (BREO ELLIPTA) 200-25 MCG/INH AEPB Inhale 1 puff into the lungs daily. 02/08/18  Yes Poulose, Bethel Born, NP  gabapentin (NEURONTIN) 300 MG capsule Take 1 capsule (300 mg total) by mouth 3 (three) times daily. 09/25/15  Yes Roselee Nova, MD  gabapentin (NEURONTIN) 800 MG tablet Take 1 tablet (800 mg total) by mouth at bedtime. 09/25/15  Yes Keith Rake Asad A, MD  glucose blood (BAYER CONTOUR TEST) test strip Use as instructed 09/03/15  Yes Keith Rake Asad A, MD  insulin lispro (HUMALOG KWIKPEN) 100 UNIT/ML KiwkPen Inject 7-8 Units into the skin 3 (three) times daily. Reported on 12/02/2015/ sliding scale   Yes [provider]  Insulin Pen Needle (FIFTY50 PEN NEEDLES) 32G X 4 MM MISC Inject 1 each into the skin as needed. 02/24/18  Yes [provider]  LANTUS SOLOSTAR 100 UNIT/ML Solostar Pen Inject 26 Units into the skin daily at 10 pm.  03/25/16  Yes [provider]  levocetirizine (XYZAL) 5 MG tablet Take 1 tablet (5 mg total) by mouth every evening. 02/08/18  Yes Poulose, Bethel Born, NP  liraglutide (VICTOZA) 18 MG/3ML SOPN Inject 18 mg into the skin daily.    Yes [provider]  Loratadine (CLARITIN PO) Take by mouth daily. am   Yes [provider]  metFORMIN (GLUCOPHAGE) 1000 MG tablet Take 1,000 mg by mouth 2 (two) times daily with a meal.   Yes [provider]  montelukast (SINGULAIR) 10 MG tablet  every morning.  04/01/16  Yes [provider]  naloxone [facility]) nasal spray 4 mg/0.1 mL For excess sedation from opioids 09/15/16  Yes Molli Barrows, MD  nitroGLYCERIN (NITROSTAT) 0.3 MG SL tablet Place 1 tablet (0.3 mg total) under the tongue every 5 (five) minutes as needed for chest pain. Reported on 12/02/2015 01/25/17  Yes [name], MD  Oxycodone HCl 10 MG TABS Take 1 tablet (10 mg total) by mouth 4 (four) times daily as needed. 03/17/18  Yes Molli Barrows, MD  pyridostigmine (MESTINON) 60 MG tablet Take 60 mg by mouth 4 (four) times daily.   Yes Anabel Bene, MD  rosuvastatin (CRESTOR) 5 MG tablet Take 5 mg by mouth daily. 11/20/17  Yes [name], MD  triamcinolone cream (KENALOG) 0.1 % apply to affected area twice a day UNTIL CLEAR 04/09/17  Yes [provider]    Review of Systems    Some degree of chronic DOE with inclines and higher levels of activity along with intermittent chest discomfort, typically occurring @ rest.  He denies palpitations, pnd, orthopnea, n, v, dizziness, syncope, edema, weight gain, or early satiety.  All other systems reviewed and are otherwise negative except as noted above.  Physical Exam    VS:  BP 130/82 (BP Location: Left Arm, Patient Position: Sitting, Cuff Size: Normal)   Pulse (!) 102   Ht 5' 7.5" (1.715 m)   Wt  231 lb 12 oz (105.1 kg)   BMI 35.76 kg/m  , BMI Body mass index is 35.76 kg/m. GEN: Well nourished, well developed, in no acute distress.  HEENT: normal.  Neck: Supple, no JVD, carotid bruits, or masses. Cardiac: RRR, no murmurs, rubs, or gallops. No clubbing, cyanosis, edema.  Radials/DP/PT 2+ and equal bilaterally.  Respiratory:  Respirations regular and unlabored, clear to auscultation bilaterally. GI: Soft, nontender, nondistended, BS + x 4. MS: no deformity or atrophy. Skin: warm and dry, no rash. Neuro:  Strength and sensation are intact. Psych: Normal affect.  Accessory Clinical Findings    ECG -sinus tachycardia, 102, prior inferior infarct, prior anterolateral infarct, no acute changes.  Assessment & Plan    1.  CAD/chronic angina:  S/p multiple stents in the LAD and RCA in 2009.  Last cath in 2016 reportedly showed patent stents and nonobs dzs.  Intermittent c/p, often at rest, a few x/month, which sometimes requires nitrates.  Perhaps slight increase in frequency over the past few years.  Low risk MV in 02/2017.  Overall, he feels that Ss have been stable and he has not had any significant exertional Ss. He knows that if symptoms worsen/become more frequent, we would have a low threshold to pursue ischemic testing/cath. He remains on asa,  blocker, and statin rx.  Will reduce ASA to 81 daily.  Could consider long-acting nitrates in the future.  2.  Essential HTN:  BP recently variable @ home but better since returning from [place] to [place].  He did increase his coreg back to 3.125 bid.  No further changes today.  4.  HL:  Zetia added earlier this year.  Statin dose limited by intolerance of higher doses.  LDL 62 after adding zetia in April.  5.  Sinus tachycardia:  HRs elevated @ home persistently.  Also elevated at multiple prior clinic visits.  CBC/TSH wnl @ last visit.  With DOE, F/u echo.  Cont  blocker.  6.  Type II DM:  A1c 7.2 in April.  Med mgmt per  PCP.  7.  Dispo:  F/u  echo.  F/u in clinic in 3 mos or sooner if necessary.  Murray Hodgkins, NP 03/28/2018, 1:13 PM

## 2018-03-28 NOTE — Progress Notes (Addendum)
Office Visit    Patient Name: [name] Date of Encounter: 03/28/2018  Primary Care Provider:  [name], MD Primary Cardiologist:  [name], MD  Chief Complaint    57 y/o ? with a h/o CAD s/p multiple PCI's to the LAD and RCA, HTN, HL, DMII, diabetic neuropathy, myasthenia gravis, and OSA, who presents for f/u related to variable BPs.  Past Medical History    Past Medical History:  Diagnosis Date  . Allergy    dust, seasonal (worse in the fall).  . Anginal pain (HCC)    1 OR 2 x IN 3 MONTHS  . 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 Followed by Pain Specialist in [place], back and neck  . Asthma    BRONCHITIS  . Cataract    First Dx in 2012  . Coronary artery disease    a. Prior Ant MI->s/p multiple stents placed in the LAD and right coronary artery (10 stents in [place]); b. 2016 Cath: reportedly nonobs dzs;  c. 02/2017 MV: EF 45-54%, medium defect of severe severity in apical anterior, apical inf, and apex. + infarct, no ishcemia.  . Diabetes mellitus without complication ([place])    TYPE 2  . Diabetic peripheral neuropathy (HCC)    feet and hands  . GERD (gastroesophageal reflux disease)   . Headache    muscle tension  . Hyperlipidemia   . Hypertension    CONTROLLED ON MEDS  . Insomnia   . Lyme disease    Chronic  . Myasthenia gravis ([place])   . Myocardial infarction ([place]) 2010  . Seasonal allergies   . Sleep apnea    CPAP   Past Surgical History:  Procedure Laterality Date  . BILATERAL CARPAL TUNNEL RELEASE Bilateral L in 2012 and R in 2013  . CARDIAC CATHETERIZATION     Several Caths, most recent in  March 2016.  [name] COLONOSCOPY WITH PROPOFOL N/A 01/10/2016   Procedure: COLONOSCOPY WITH PROPOFOL;  Surgeon: [name], MD;  Location: [facility] ENDOSCOPY;  Service: Endoscopy;  Laterality: N/A;  . CORONARY ANGIOPLASTY    . ESOPHAGOGASTRODUODENOSCOPY (EGD) WITH PROPOFOL N/A 01/10/2016   Procedure: ESOPHAGOGASTRODUODENOSCOPY (EGD) WITH PROPOFOL;  Surgeon: [name], MD;  Location: [facility] ENDOSCOPY;  Service: Endoscopy;  Laterality: N/A;  . EYE SURGERY Bilateral 2012   cataract/bilateral vitrectomies  . TONSILLECTOMY AND ADENOIDECTOMY     As a child  . TUNNELED VENOUS CATHETER PLACEMENT     removed    Allergies  Allergies  Allergen Reactions  . Metoprolol Rash  . Novolog [Insulin Aspart] Hives    History of Present Illness    57 year old ? with the above complex past medical history including coronary artery disease status post prior myocardial infarction in 2009 with subsequent placement of a total of 10 stents between the LAD and right coronary artery.  These were all performed in [place].  Last catheterization March 2016 reportedly showed no obstructive disease and he has been treated medically since.  Other history includes hypertension, hyperlipidemia, type 2 diabetes mellitus, diabetic neuropathy, myasthenia gravis, GERD, and sleep apnea on CPAP.     He has some degree of chronic angina, typically occurring a few times/month, usually @ rest, lasting a few mins, and resolving with rest.  Only on one occasion in the past few mos has he required a second sl ntg.  He is accustomed to experiencing this from time to time and has noted some increase in frequency.  Though he does have chronic  DOE @ higher levels of activity, such as walking up a hill, he does not typically experience exertional chest pain.  When I saw him in clinic in February, his BP was soft and I had him reduce his carvedilol to 1/2 of a 3.125mg  tab BID. I also obtained labs in the setting of sinus tachycardia - cbc, tsh wnl.  Following that visit, he visited [place] and while there, his BP was trending in the 160's to 170's.  He went back up on his coreg to 3.125mg  BID.  Since returning to [place], BP has been more stable, and typically in the 120's to 130's.  He has continued to note elevated HRs with resting rates typically in the high 90's to low 100's.     Home Medications    Prior to  Admission medications   Medication Sig Start Date End Date Taking? Authorizing Provider  albuterol (PROVENTIL HFA;VENTOLIN HFA) 108 (90 Base) MCG/ACT inhaler Inhale 2 puffs into the lungs every 6 (six) hours as needed for wheezing or shortness of breath.   Yes [provider]  albuterol (PROVENTIL) (2.5 MG/3ML) 0.083% nebulizer solution Take 2.5 mg by nebulization every 6 (six) hours as needed for wheezing or shortness of breath.   Yes [provider]  aspirin 325 MG tablet Take 325 mg by mouth daily. AM   Yes [provider]  carvedilol (COREG) 3.125 MG tablet TAKE 1 TABLET BY MOUTH TWICE DAILY 03/02/18  Yes [name], MD  cyanocobalamin 1000 MCG tablet Take 1,000 mcg by mouth daily. am   Yes [provider]  cyclobenzaprine (FLEXERIL) 10 MG tablet Take 1 tablet (10 mg total) by mouth 2 (two) times daily. 07/14/17  Yes Molli Barrows, MD  dexlansoprazole (DEXILANT) 60 MG capsule Take 1 capsule (60 mg total) by mouth daily. 12/06/17  Yes [name], MD  ezetimibe (ZETIA) 10 MG tablet Take 1 tablet (10 mg total) by mouth daily. 11/25/17 03/28/18 Yes Theora Gianotti, NP  fluticasone furoate-vilanterol (BREO ELLIPTA) 200-25 MCG/INH AEPB Inhale 1 puff into the lungs daily. 02/08/18  Yes Poulose, Bethel Born, NP  gabapentin (NEURONTIN) 300 MG capsule Take 1 capsule (300 mg total) by mouth 3 (three) times daily. 09/25/15  Yes Roselee Nova, MD  gabapentin (NEURONTIN) 800 MG tablet Take 1 tablet (800 mg total) by mouth at bedtime. 09/25/15  Yes Keith Rake Asad A, MD  glucose blood (BAYER CONTOUR TEST) test strip Use as instructed 09/03/15  Yes Keith Rake Asad A, MD  insulin lispro (HUMALOG KWIKPEN) 100 UNIT/ML KiwkPen Inject 7-8 Units into the skin 3 (three) times daily. Reported on 12/02/2015/ sliding scale   Yes [provider]  Insulin Pen Needle (FIFTY50 PEN NEEDLES) 32G X 4 MM MISC Inject 1 each into the skin as needed. 02/24/18  Yes [provider]  LANTUS SOLOSTAR 100 UNIT/ML Solostar Pen Inject 26 Units into the skin daily at 10 pm.  03/25/16  Yes [provider]  levocetirizine (XYZAL) 5 MG tablet Take 1 tablet (5 mg total) by mouth every evening. 02/08/18  Yes Poulose, Bethel Born, NP  liraglutide (VICTOZA) 18 MG/3ML SOPN Inject 18 mg into the skin daily.    Yes [provider]  Loratadine (CLARITIN PO) Take by mouth daily. am   Yes [provider]  metFORMIN (GLUCOPHAGE) 1000 MG tablet Take 1,000 mg by mouth 2 (two) times daily with a meal.   Yes [provider]  montelukast (SINGULAIR) 10 MG tablet  every morning.  04/01/16  Yes [provider]  naloxone [facility]) nasal spray 4 mg/0.1 mL For excess sedation from opioids 09/15/16  Yes Molli Barrows, MD  nitroGLYCERIN (NITROSTAT) 0.3 MG SL tablet Place 1 tablet (0.3 mg total) under the tongue every 5 (five) minutes as needed for chest pain. Reported on 12/02/2015 01/25/17  Yes [name], MD  Oxycodone HCl 10 MG TABS Take 1 tablet (10 mg total) by mouth 4 (four) times daily as needed. 03/17/18  Yes Molli Barrows, MD  pyridostigmine (MESTINON) 60 MG tablet Take 60 mg by mouth 4 (four) times daily.   Yes Anabel Bene, MD  rosuvastatin (CRESTOR) 5 MG tablet Take 5 mg by mouth daily. 11/20/17  Yes [name], MD  triamcinolone cream (KENALOG) 0.1 % apply to affected area twice a day UNTIL CLEAR 04/09/17  Yes [provider]    Review of Systems    Some degree of chronic DOE with inclines and higher levels of activity along with intermittent chest discomfort, typically occurring @ rest.  He denies palpitations, pnd, orthopnea, n, v, dizziness, syncope, edema, weight gain, or early satiety.  All other systems reviewed and are otherwise negative except as noted above.  Physical Exam    VS:  BP 130/82 (BP Location: Left Arm, Patient Position: Sitting, Cuff Size: Normal)   Pulse (!) 102   Ht 5' 7.5" (1.715 m)   Wt  231 lb 12 oz (105.1 kg)   BMI 35.76 kg/m  , BMI Body mass index is 35.76 kg/m. GEN: Well nourished, well developed, in no acute distress.  HEENT: normal.  Neck: Supple, no JVD, carotid bruits, or masses. Cardiac: RRR, no murmurs, rubs, or gallops. No clubbing, cyanosis, edema.  Radials/DP/PT 2+ and equal bilaterally.  Respiratory:  Respirations regular and unlabored, clear to auscultation bilaterally. GI: Soft, nontender, nondistended, BS + x 4. MS: no deformity or atrophy. Skin: warm and dry, no rash. Neuro:  Strength and sensation are intact. Psych: Normal affect.  Accessory Clinical Findings    ECG -sinus tachycardia, 102, prior inferior infarct, prior anterolateral infarct, no acute changes.  Assessment & Plan    1.  CAD/chronic angina:  S/p multiple stents in the LAD and RCA in 2009.  Last cath in 2016 reportedly showed patent stents and nonobs dzs.  Intermittent c/p, often at rest, a few x/month, which sometimes requires nitrates.  Perhaps slight increase in frequency over the past few years.  Low risk MV in 02/2017.  Overall, he feels that Ss have been stable and he has not had any significant exertional Ss. He knows that if symptoms worsen/become more frequent, we would have a low threshold to pursue ischemic testing/cath. He remains on asa,  blocker, and statin rx.  Will reduce ASA to 81 daily.  Could consider long-acting nitrates in the future.  2.  Essential HTN:  BP recently variable @ home but better since returning from [place] to [place].  He did increase his coreg back to 3.125 bid.  No further changes today.  4.  HL:  Zetia added earlier this year.  Statin dose limited by intolerance of higher doses.  LDL 62 after adding zetia in April.  5.  Sinus tachycardia:  HRs elevated @ home persistently.  Also elevated at multiple prior clinic visits.  CBC/TSH wnl @ last visit.  With DOE, F/u echo.  Cont  blocker.  6.  Type II DM:  A1c 7.2 in April.  Med mgmt per  PCP.  7.  Dispo:  F/u  echo.  F/u in clinic in 3 mos or sooner if necessary.  Murray Hodgkins, NP 03/28/2018, 1:13 PM

## 2018-03-28 NOTE — Patient Instructions (Signed)
Medication Instructions:  Your physician recommends that you continue on your current medications as directed. Please refer to the Current Medication list given to you today.   Labwork: none  Testing/Procedures: Your physician has requested that you have an echocardiogram. Echocardiography is a painless test that uses sound waves to create images of your heart. It provides your doctor with information about the size and shape of your heart and how well your heart's chambers and valves are working. This procedure takes approximately one hour. There are no restrictions for this procedure. - You may get an IV, if needed, to receive an ultrasound enhancing agent through to better visualize your heart.    Follow-Up: Your physician recommends that you schedule a follow-up appointment in: [place].   If you need a refill on your cardiac medications before your next appointment, please call your pharmacy.    Echocardiogram An echocardiogram, or echocardiography, uses sound waves (ultrasound) to produce an image of your heart. The echocardiogram is simple, painless, obtained within a short period of time, and offers valuable information to your health care provider. The images from an echocardiogram can provide information such as:  Evidence of coronary artery disease (CAD).  Heart size.  Heart muscle function.  Heart valve function.  Aneurysm detection.  Evidence of a past heart attack.  Fluid buildup around the heart.  Heart muscle thickening.  Assess heart valve function.  Tell a health care provider about:  Any allergies you have.  All medicines you are taking, including vitamins, herbs, eye drops, creams, and over-the-counter medicines.  Any problems you or family members have had with anesthetic medicines.  Any blood disorders you have.  Any surgeries you have had.  Any medical conditions you have.  Whether you are pregnant or may be pregnant. What  happens before the procedure? No special preparation is needed. Eat and drink normally. What happens during the procedure?  In order to produce an image of your heart, gel will be applied to your chest and a wand-like tool (transducer) will be moved over your chest. The gel will help transmit the sound waves from the transducer. The sound waves will harmlessly bounce off your heart to allow the heart images to be captured in real-time motion. These images will then be recorded.  You may need an IV to receive a medicine that improves the quality of the pictures. What happens after the procedure? You may return to your normal schedule including diet, activities, and medicines, unless your health care provider tells you otherwise. This information is not intended to replace advice given to you by your health care provider. Make sure you discuss any questions you have with your health care provider. Document Released: 09/18/2000 Document Revised: 05/09/2016 Document Reviewed: 05/29/2013 Elsevier Interactive Patient Education  2017 [employer].

## 2018-03-29 ENCOUNTER — Other Ambulatory Visit: Payer: Self-pay | Admitting: Nurse Practitioner

## 2018-03-29 DIAGNOSIS — R0600 Dyspnea, unspecified: Secondary | ICD-10-CM

## 2018-03-31 ENCOUNTER — Other Ambulatory Visit: Payer: Self-pay

## 2018-03-31 ENCOUNTER — Ambulatory Visit (INDEPENDENT_AMBULATORY_CARE_PROVIDER_SITE_OTHER): Payer: Medicare Other

## 2018-03-31 DIAGNOSIS — R0600 Dyspnea, unspecified: Secondary | ICD-10-CM

## 2018-03-31 MED ORDER — PERFLUTREN LIPID MICROSPHERE
1.0000 mL | INTRAVENOUS | Status: AC | PRN
  Administered 2018-03-31: 3 mL via INTRAVENOUS

## 2018-04-08 ENCOUNTER — Telehealth: Payer: Self-pay | Admitting: *Deleted

## 2018-04-08 ENCOUNTER — Encounter: Payer: Self-pay | Admitting: *Deleted

## 2018-04-08 DIAGNOSIS — R0602 Shortness of breath: Secondary | ICD-10-CM

## 2018-04-08 DIAGNOSIS — R079 Chest pain, unspecified: Secondary | ICD-10-CM

## 2018-04-08 DIAGNOSIS — Z01818 Encounter for other preprocedural examination: Secondary | ICD-10-CM

## 2018-04-08 NOTE — Telephone Encounter (Signed)
Patient called and made aware that his cardiac cath is scheduled for 04/25/18 with Dr. Fletcher Anon at 9:30 with arrival time at 8:30. He will have pre op labs drawn prior. Letter with instructions will be mailed to him. He has been advised to call if he has any further questions.

## 2018-04-11 MED ORDER — EZETIMIBE 10 MG PO TABS: 10.0000 mg | ORAL_TABLET | Freq: Every day | ORAL | 3 refills | Status: DC

## 2018-04-12 ENCOUNTER — Other Ambulatory Visit
Admission: RE | Admit: 2018-04-12 | Discharge: 2018-04-12 | Disposition: A | Discharge status: Home / Self Care | Payer: Medicare Other | Source: Ambulatory Visit | Attending: Nurse Practitioner | Admitting: Nurse Practitioner

## 2018-04-12 DIAGNOSIS — Z01818 Encounter for other preprocedural examination: Secondary | ICD-10-CM | POA: Insufficient documentation

## 2018-04-12 DIAGNOSIS — R079 Chest pain, unspecified: Secondary | ICD-10-CM | POA: Diagnosis not present

## 2018-04-12 DIAGNOSIS — R0602 Shortness of breath: Secondary | ICD-10-CM | POA: Insufficient documentation

## 2018-04-12 LAB — BASIC METABOLIC PANEL
Anion gap: 6 (ref 5–15)
BUN: 20 mg/dL (ref 6–20)
CHLORIDE: 108 mmol/L (ref 98–111)
CO2: 29 mmol/L (ref 22–32)
Calcium: 9.9 mg/dL (ref 8.9–10.3)
Creatinine, Ser: 1.18 mg/dL (ref 0.61–1.24)
GFR calc Af Amer: >60 mL/min (ref >60)
GFR calc non Af Amer: >60 mL/min (ref >60)
Glucose, Bld: 154 mg/dL — ABNORMAL HIGH (ref 70–99)
POTASSIUM: 5.2 mmol/L — AB (ref 3.5–5.1)
SODIUM: 143 mmol/L (ref 135–145)

## 2018-04-12 LAB — CBC
HCT: 37.8 % — ABNORMAL LOW (ref 40.0–52.0)
HEMOGLOBIN: 12.7 g/dL — AB (ref 13.0–18.0)
MCH: 28.7 pg (ref 26.0–34.0)
MCHC: 33.5 g/dL (ref 32.0–36.0)
MCV: 85.7 fL (ref 80.0–100.0)
Platelets: 186 10*3/uL (ref 150–440)
RBC: 4.41 MIL/uL (ref 4.40–5.90)
RDW: 14 % (ref 11.5–14.5)
WBC: 6.2 10*3/uL (ref 3.8–10.6)

## 2018-04-15 DIAGNOSIS — E1165 Type 2 diabetes mellitus with hyperglycemia: Secondary | ICD-10-CM | POA: Diagnosis not present

## 2018-04-15 DIAGNOSIS — Z794 Long term (current) use of insulin: Secondary | ICD-10-CM | POA: Diagnosis not present

## 2018-04-18 ENCOUNTER — Encounter: Payer: Self-pay | Admitting: Nurse Practitioner

## 2018-04-25 ENCOUNTER — Ambulatory Visit
Admission: RE | Admit: 2018-04-25 | Discharge: 2018-04-26 | Disposition: A | Discharge status: Home / Self Care | Payer: Medicare Other | Source: Ambulatory Visit | Attending: Cardiovascular Disease | Admitting: Cardiovascular Disease

## 2018-04-25 ENCOUNTER — Encounter
Admission: RE | Disposition: A | Discharge status: Home / Self Care | Payer: Self-pay | Source: Ambulatory Visit | Attending: Cardiovascular Disease

## 2018-04-25 ENCOUNTER — Other Ambulatory Visit: Payer: Self-pay

## 2018-04-25 DIAGNOSIS — I200 Unstable angina: Secondary | ICD-10-CM

## 2018-04-25 DIAGNOSIS — I255 Ischemic cardiomyopathy: Secondary | ICD-10-CM

## 2018-04-25 DIAGNOSIS — I25118 Atherosclerotic heart disease of native coronary artery with other forms of angina pectoris: Secondary | ICD-10-CM | POA: Insufficient documentation

## 2018-04-25 DIAGNOSIS — I5042 Chronic combined systolic (congestive) and diastolic (congestive) heart failure: Secondary | ICD-10-CM

## 2018-04-25 DIAGNOSIS — Z9861 Coronary angioplasty status: Secondary | ICD-10-CM | POA: Diagnosis not present

## 2018-04-25 DIAGNOSIS — Z955 Presence of coronary angioplasty implant and graft: Secondary | ICD-10-CM | POA: Diagnosis not present

## 2018-04-25 DIAGNOSIS — IMO0002 Reserved for concepts with insufficient information to code with codable children: Secondary | ICD-10-CM

## 2018-04-25 DIAGNOSIS — Z7982 Long term (current) use of aspirin: Secondary | ICD-10-CM | POA: Diagnosis not present

## 2018-04-25 DIAGNOSIS — Z888 Allergy status to other drugs, medicaments and biological substances status: Secondary | ICD-10-CM | POA: Insufficient documentation

## 2018-04-25 DIAGNOSIS — Z9889 Other specified postprocedural states: Secondary | ICD-10-CM | POA: Insufficient documentation

## 2018-04-25 DIAGNOSIS — G4733 Obstructive sleep apnea (adult) (pediatric): Secondary | ICD-10-CM | POA: Insufficient documentation

## 2018-04-25 DIAGNOSIS — J45909 Unspecified asthma, uncomplicated: Secondary | ICD-10-CM | POA: Diagnosis not present

## 2018-04-25 DIAGNOSIS — I10 Essential (primary) hypertension: Secondary | ICD-10-CM | POA: Diagnosis present

## 2018-04-25 DIAGNOSIS — G4700 Insomnia, unspecified: Secondary | ICD-10-CM | POA: Diagnosis not present

## 2018-04-25 DIAGNOSIS — Z79899 Other long term (current) drug therapy: Secondary | ICD-10-CM | POA: Insufficient documentation

## 2018-04-25 DIAGNOSIS — R0789 Other chest pain: Secondary | ICD-10-CM | POA: Diagnosis present

## 2018-04-25 DIAGNOSIS — E1142 Type 2 diabetes mellitus with diabetic polyneuropathy: Secondary | ICD-10-CM | POA: Diagnosis not present

## 2018-04-25 DIAGNOSIS — I208 Other forms of angina pectoris: Secondary | ICD-10-CM | POA: Insufficient documentation

## 2018-04-25 DIAGNOSIS — I340 Nonrheumatic mitral (valve) insufficiency: Secondary | ICD-10-CM | POA: Insufficient documentation

## 2018-04-25 DIAGNOSIS — K219 Gastro-esophageal reflux disease without esophagitis: Secondary | ICD-10-CM | POA: Diagnosis present

## 2018-04-25 DIAGNOSIS — I2510 Atherosclerotic heart disease of native coronary artery without angina pectoris: Secondary | ICD-10-CM | POA: Diagnosis present

## 2018-04-25 DIAGNOSIS — I252 Old myocardial infarction: Secondary | ICD-10-CM | POA: Diagnosis not present

## 2018-04-25 DIAGNOSIS — E118 Type 2 diabetes mellitus with unspecified complications: Secondary | ICD-10-CM

## 2018-04-25 DIAGNOSIS — Z794 Long term (current) use of insulin: Secondary | ICD-10-CM | POA: Insufficient documentation

## 2018-04-25 DIAGNOSIS — E1165 Type 2 diabetes mellitus with hyperglycemia: Secondary | ICD-10-CM | POA: Diagnosis present

## 2018-04-25 DIAGNOSIS — R000 Tachycardia, unspecified: Secondary | ICD-10-CM | POA: Insufficient documentation

## 2018-04-25 DIAGNOSIS — E785 Hyperlipidemia, unspecified: Secondary | ICD-10-CM | POA: Diagnosis not present

## 2018-04-25 DIAGNOSIS — G7000 Myasthenia gravis without (acute) exacerbation: Secondary | ICD-10-CM | POA: Insufficient documentation

## 2018-04-25 DIAGNOSIS — R079 Chest pain, unspecified: Secondary | ICD-10-CM

## 2018-04-25 HISTORY — PX: CORONARY STENT INTERVENTION: CATH118234

## 2018-04-25 HISTORY — DX: Ischemic cardiomyopathy: I25.5

## 2018-04-25 HISTORY — PX: LEFT HEART CATH AND CORONARY ANGIOGRAPHY: CATH118249

## 2018-04-25 LAB — GLUCOSE, CAPILLARY
GLUCOSE-CAPILLARY: 273 mg/dL — AB (ref 70–99)
Glucose-Capillary: 170 mg/dL — ABNORMAL HIGH (ref 70–99)
Glucose-Capillary: 201 mg/dL — ABNORMAL HIGH (ref 70–99)

## 2018-04-25 LAB — POCT ACTIVATED CLOTTING TIME: ACTIVATED CLOTTING TIME: 296 s

## 2018-04-25 SURGERY — LEFT HEART CATH AND CORONARY ANGIOGRAPHY
Anesthesia: Moderate Sedation

## 2018-04-25 MED ORDER — FLUTICASONE FUROATE-VILANTEROL 200-25 MCG/INH IN AEPB
1.0000 | INHALATION_SPRAY | Freq: Every day | RESPIRATORY_TRACT | Status: DC
  Administered 2018-04-26: 1 via RESPIRATORY_TRACT
  Filled 2018-04-25: qty 28

## 2018-04-25 MED ORDER — ACETAMINOPHEN 325 MG PO TABS: 650.0000 mg | ORAL_TABLET | ORAL | Status: DC | PRN

## 2018-04-25 MED ORDER — SODIUM CHLORIDE 0.9% FLUSH: 3.0000 mL | INTRAVENOUS | Status: DC | PRN

## 2018-04-25 MED ORDER — GABAPENTIN 300 MG PO CAPS
300.0000 mg | ORAL_CAPSULE | Freq: Three times a day (TID) | ORAL | Status: DC
  Administered 2018-04-25 – 2018-04-26 (×2): 300 mg via ORAL
  Filled 2018-04-25 (×3): qty 1

## 2018-04-25 MED ORDER — VERAPAMIL HCL 2.5 MG/ML IV SOLN
INTRAVENOUS | Status: DC | PRN
  Administered 2018-04-25: 2.5 mg via INTRA_ARTERIAL

## 2018-04-25 MED ORDER — MIDAZOLAM HCL 2 MG/2ML IJ SOLN
INTRAMUSCULAR | Status: DC | PRN
  Administered 2018-04-25: 1 mg via INTRAVENOUS

## 2018-04-25 MED ORDER — MONTELUKAST SODIUM 10 MG PO TABS
10.0000 mg | ORAL_TABLET | Freq: Every day | ORAL | Status: DC
  Administered 2018-04-25: 10 mg via ORAL
  Filled 2018-04-25: qty 1

## 2018-04-25 MED ORDER — HEPARIN SODIUM (PORCINE) 1000 UNIT/ML IJ SOLN
INTRAMUSCULAR | Status: DC | PRN
  Administered 2018-04-25: 6000 [IU] via INTRAVENOUS
  Administered 2018-04-25: 5000 [IU] via INTRAVENOUS

## 2018-04-25 MED ORDER — CLOPIDOGREL BISULFATE 75 MG PO TABS
ORAL_TABLET | ORAL | Status: AC
  Filled 2018-04-25: qty 8

## 2018-04-25 MED ORDER — IOPAMIDOL (ISOVUE-300) INJECTION 61%
INTRAVENOUS | Status: DC | PRN
  Administered 2018-04-25: 155 mL via INTRA_ARTERIAL

## 2018-04-25 MED ORDER — CLOPIDOGREL BISULFATE 75 MG PO TABS
ORAL_TABLET | ORAL | Status: DC | PRN
  Administered 2018-04-25: 600 mg via ORAL

## 2018-04-25 MED ORDER — SODIUM CHLORIDE 0.9 % IV SOLN: 250.0000 mL | INTRAVENOUS | Status: DC | PRN

## 2018-04-25 MED ORDER — VERAPAMIL HCL 2.5 MG/ML IV SOLN
INTRAVENOUS | Status: AC
  Filled 2018-04-25: qty 2

## 2018-04-25 MED ORDER — FENTANYL CITRATE (PF) 100 MCG/2ML IJ SOLN
INTRAMUSCULAR | Status: AC
  Filled 2018-04-25: qty 2

## 2018-04-25 MED ORDER — METFORMIN HCL 500 MG PO TABS
1000.0000 mg | ORAL_TABLET | Freq: Two times a day (BID) | ORAL | Status: DC
  Filled 2018-04-25: qty 2

## 2018-04-25 MED ORDER — LEVOCETIRIZINE DIHYDROCHLORIDE 5 MG PO TABS
5.0000 mg | ORAL_TABLET | Freq: Every evening | ORAL | Status: DC
  Filled 2018-04-25 (×2): qty 1

## 2018-04-25 MED ORDER — MIDAZOLAM HCL 2 MG/2ML IJ SOLN
INTRAMUSCULAR | Status: AC
  Filled 2018-04-25: qty 2

## 2018-04-25 MED ORDER — METFORMIN HCL 500 MG PO TABS: 1000.0000 mg | ORAL_TABLET | Freq: Two times a day (BID) | ORAL | Status: DC

## 2018-04-25 MED ORDER — CLOPIDOGREL BISULFATE 75 MG PO TABS
75.0000 mg | ORAL_TABLET | Freq: Every day | ORAL | Status: DC
  Administered 2018-04-26: 75 mg via ORAL
  Filled 2018-04-25: qty 1

## 2018-04-25 MED ORDER — OXYCODONE HCL 5 MG PO TABS
10.0000 mg | ORAL_TABLET | Freq: Four times a day (QID) | ORAL | Status: DC | PRN
  Administered 2018-04-25 – 2018-04-26 (×3): 10 mg via ORAL
  Filled 2018-04-25 (×2): qty 2

## 2018-04-25 MED ORDER — ALBUTEROL SULFATE (2.5 MG/3ML) 0.083% IN NEBU: 2.5000 mg | INHALATION_SOLUTION | Freq: Four times a day (QID) | RESPIRATORY_TRACT | Status: DC | PRN

## 2018-04-25 MED ORDER — CARVEDILOL 3.125 MG PO TABS
3.1250 mg | ORAL_TABLET | Freq: Two times a day (BID) | ORAL | Status: DC
  Administered 2018-04-25 – 2018-04-26 (×2): 3.125 mg via ORAL
  Filled 2018-04-25 (×2): qty 1

## 2018-04-25 MED ORDER — PANTOPRAZOLE SODIUM 40 MG PO TBEC: 40.0000 mg | DELAYED_RELEASE_TABLET | Freq: Every day | ORAL | Status: DC

## 2018-04-25 MED ORDER — HEPARIN SODIUM (PORCINE) 1000 UNIT/ML IJ SOLN
INTRAMUSCULAR | Status: AC
  Filled 2018-04-25: qty 1

## 2018-04-25 MED ORDER — ASPIRIN EC 81 MG PO TBEC
81.0000 mg | DELAYED_RELEASE_TABLET | Freq: Every day | ORAL | Status: DC
  Administered 2018-04-26: 81 mg via ORAL
  Filled 2018-04-25: qty 1

## 2018-04-25 MED ORDER — CYCLOBENZAPRINE HCL 10 MG PO TABS
10.0000 mg | ORAL_TABLET | Freq: Two times a day (BID) | ORAL | Status: DC
  Administered 2018-04-26: 10 mg via ORAL
  Filled 2018-04-25 (×2): qty 1

## 2018-04-25 MED ORDER — SACUBITRIL-VALSARTAN 24-26 MG PO TABS
1.0000 | ORAL_TABLET | Freq: Two times a day (BID) | ORAL | Status: DC
  Administered 2018-04-25 – 2018-04-26 (×2): 1 via ORAL
  Filled 2018-04-25 (×2): qty 1

## 2018-04-25 MED ORDER — ALBUTEROL SULFATE HFA 108 (90 BASE) MCG/ACT IN AERS: 2.0000 | INHALATION_SPRAY | Freq: Four times a day (QID) | RESPIRATORY_TRACT | Status: DC | PRN

## 2018-04-25 MED ORDER — VITAMIN B-12 1000 MCG PO TABS
1000.0000 ug | ORAL_TABLET | Freq: Every day | ORAL | Status: DC
  Administered 2018-04-26: 1000 ug via ORAL
  Filled 2018-04-25: qty 1

## 2018-04-25 MED ORDER — SODIUM CHLORIDE 0.9 % IV SOLN
INTRAVENOUS | Status: DC
  Administered 2018-04-25: 09:00:00 via INTRAVENOUS

## 2018-04-25 MED ORDER — SODIUM CHLORIDE 0.9% FLUSH
3.0000 mL | Freq: Two times a day (BID) | INTRAVENOUS | Status: DC
  Administered 2018-04-25: 3 mL via INTRAVENOUS

## 2018-04-25 MED ORDER — LIDOCAINE HCL (PF) 1 % IJ SOLN
INTRAMUSCULAR | Status: DC | PRN
  Administered 2018-04-25: 2 mL via INTRADERMAL

## 2018-04-25 MED ORDER — EZETIMIBE 10 MG PO TABS
10.0000 mg | ORAL_TABLET | Freq: Every day | ORAL | Status: DC
  Administered 2018-04-26: 10 mg via ORAL
  Filled 2018-04-25: qty 1

## 2018-04-25 MED ORDER — FENTANYL CITRATE (PF) 100 MCG/2ML IJ SOLN
INTRAMUSCULAR | Status: DC | PRN
  Administered 2018-04-25: 50 ug via INTRAVENOUS

## 2018-04-25 MED ORDER — LORATADINE 10 MG PO TABS
10.0000 mg | ORAL_TABLET | Freq: Every day | ORAL | Status: DC
  Administered 2018-04-26: 10 mg via ORAL
  Filled 2018-04-25: qty 1

## 2018-04-25 MED ORDER — HEPARIN (PORCINE) IN NACL 1000-0.9 UT/500ML-% IV SOLN
INTRAVENOUS | Status: AC
  Filled 2018-04-25: qty 1000

## 2018-04-25 MED ORDER — INSULIN LISPRO 100 UNIT/ML (KWIKPEN)
7.0000 [IU] | PEN_INJECTOR | Freq: Three times a day (TID) | SUBCUTANEOUS | Status: DC
  Filled 2018-04-25: qty 3

## 2018-04-25 MED ORDER — ROSUVASTATIN CALCIUM 5 MG PO TABS
5.0000 mg | ORAL_TABLET | Freq: Every day | ORAL | Status: DC
  Filled 2018-04-25: qty 1

## 2018-04-25 MED ORDER — PYRIDOSTIGMINE BROMIDE 60 MG PO TABS
60.0000 mg | ORAL_TABLET | Freq: Four times a day (QID) | ORAL | Status: DC
  Administered 2018-04-25 – 2018-04-26 (×2): 60 mg via ORAL
  Filled 2018-04-25 (×6): qty 1

## 2018-04-25 MED ORDER — LIDOCAINE HCL (PF) 1 % IJ SOLN
INTRAMUSCULAR | Status: AC
  Filled 2018-04-25: qty 30

## 2018-04-25 MED ORDER — INSULIN GLARGINE 100 UNIT/ML ~~LOC~~ SOLN
34.0000 [IU] | Freq: Every day | SUBCUTANEOUS | Status: DC
  Administered 2018-04-25: 34 [IU] via SUBCUTANEOUS
  Filled 2018-04-25 (×2): qty 0.34

## 2018-04-25 MED ORDER — SODIUM CHLORIDE 0.9% FLUSH: 3.0000 mL | Freq: Two times a day (BID) | INTRAVENOUS | Status: DC

## 2018-04-25 MED ORDER — OXYCODONE HCL 5 MG PO TABS
ORAL_TABLET | ORAL | Status: AC
  Administered 2018-04-25: 10 mg via ORAL
  Filled 2018-04-25: qty 2

## 2018-04-25 MED ORDER — ASPIRIN 81 MG PO CHEW: 81.0000 mg | CHEWABLE_TABLET | ORAL | Status: DC

## 2018-04-25 MED ORDER — GABAPENTIN 400 MG PO CAPS
800.0000 mg | ORAL_CAPSULE | Freq: Every day | ORAL | Status: DC
  Administered 2018-04-25: 800 mg via ORAL
  Filled 2018-04-25: qty 2

## 2018-04-25 MED ORDER — NITROGLYCERIN 0.4 MG SL SUBL: 0.4000 mg | SUBLINGUAL_TABLET | SUBLINGUAL | Status: DC | PRN

## 2018-04-25 MED ORDER — ONDANSETRON HCL 4 MG/2ML IJ SOLN
4.0000 mg | Freq: Four times a day (QID) | INTRAMUSCULAR | Status: DC | PRN
Start: 2018-04-25 — End: 2018-04-26

## 2018-04-25 MED ORDER — FUROSEMIDE 40 MG PO TABS
40.0000 mg | ORAL_TABLET | Freq: Two times a day (BID) | ORAL | Status: DC
  Administered 2018-04-25 – 2018-04-26 (×2): 40 mg via ORAL
  Filled 2018-04-25 (×2): qty 1

## 2018-04-25 SURGICAL SUPPLY — 14 items
BALLN TREK RX 2.75X12 (BALLOONS) ×4
BALLOON TREK RX 2.75X12 (BALLOONS) ×2 IMPLANT
CATH INFINITI 5FR ANG PIGTAIL (CATHETERS) ×4 IMPLANT
CATH INFINITI 5FR JK (CATHETERS) ×4 IMPLANT
CATH LAUNCHER 6FR EBU3.5 (CATHETERS) ×4 IMPLANT
DEVICE INFLAT 30 PLUS (MISCELLANEOUS) ×4 IMPLANT
DEVICE RAD TR BAND REGULAR (VASCULAR PRODUCTS) ×4 IMPLANT
KIT MANI 3VAL PERCEP (MISCELLANEOUS) ×4 IMPLANT
NEEDLE PERC 21GX4CM (NEEDLE) ×4 IMPLANT
PACK CARDIAC CATH (CUSTOM PROCEDURE TRAY) ×4 IMPLANT
SHEATH RAIN RADIAL 21G 6FR (SHEATH) ×4 IMPLANT
STENT SIERRA 3.25 X 15 MM (Permanent Stent) ×4 IMPLANT
WIRE INTUITION PROPEL ST 180CM (WIRE) ×4 IMPLANT
WIRE ROSEN-J .035X260CM (WIRE) ×4 IMPLANT

## 2018-04-25 NOTE — Progress Notes (Signed)
Per pharmacy, 2 of patients medications are non-formulary (humalog and xyzol).  Patient to have wife bring these medications to hospital.

## 2018-04-25 NOTE — Interval H&P Note (Signed)
History and Physical Interval Note: The patient had an echocardiogram done due to exertional dyspnea and chest pain.  It showed an EF of 30 to 35% which is below his baseline.  Due to his symptoms and these findings, a left heart catheterization was recommended.  Cath Lab Visit (complete for each Cath Lab visit)  Clinical Evaluation Leading to the Procedure:   ACS: No.  Non-ACS:    Anginal Classification: CCS III  Anti-ischemic medical therapy: Minimal Therapy (1 class of medications)  Non-Invasive Test Results: Equivocal test results  Prior CABG: No previous CABG        04/25/2018 10:50 AM  [name]  has presented today for surgery, with the diagnosis of LT Cath    Chest pain   Shortness of breath  The various methods of treatment have been discussed with the patient and family. After consideration of risks, benefits and other options for treatment, the patient has consented to  Procedure(s): LEFT HEART CATH AND CORONARY ANGIOGRAPHY (Left) as a surgical intervention .  The patient's history has been reviewed, patient examined, no change in status, stable for surgery.  I have reviewed the patient's chart and labs.  Questions were answered to the patient's satisfaction.     [name]

## 2018-04-25 NOTE — Progress Notes (Signed)
Pt stated that he would not wear CPAP tonight, but would call if he thinks he might need it.

## 2018-04-25 NOTE — Progress Notes (Signed)
PT refused to wear CPAP tonight

## 2018-04-25 NOTE — Progress Notes (Signed)
Patient alert and oriented, vss, having some chronic back pain, relieved with medication.  Using urinal, NSR on monitor.

## 2018-04-26 ENCOUNTER — Telehealth: Payer: Self-pay | Admitting: Cardiovascular Disease

## 2018-04-26 ENCOUNTER — Other Ambulatory Visit: Payer: Self-pay | Admitting: Nurse Practitioner

## 2018-04-26 ENCOUNTER — Encounter: Payer: Self-pay | Admitting: Nurse Practitioner

## 2018-04-26 DIAGNOSIS — G7000 Myasthenia gravis without (acute) exacerbation: Secondary | ICD-10-CM | POA: Diagnosis not present

## 2018-04-26 DIAGNOSIS — E785 Hyperlipidemia, unspecified: Secondary | ICD-10-CM | POA: Diagnosis not present

## 2018-04-26 DIAGNOSIS — R000 Tachycardia, unspecified: Secondary | ICD-10-CM | POA: Diagnosis not present

## 2018-04-26 DIAGNOSIS — Z955 Presence of coronary angioplasty implant and graft: Secondary | ICD-10-CM | POA: Diagnosis not present

## 2018-04-26 DIAGNOSIS — G4733 Obstructive sleep apnea (adult) (pediatric): Secondary | ICD-10-CM | POA: Diagnosis not present

## 2018-04-26 DIAGNOSIS — Z79899 Other long term (current) drug therapy: Secondary | ICD-10-CM | POA: Diagnosis not present

## 2018-04-26 DIAGNOSIS — I200 Unstable angina: Secondary | ICD-10-CM

## 2018-04-26 DIAGNOSIS — Z7982 Long term (current) use of aspirin: Secondary | ICD-10-CM | POA: Diagnosis not present

## 2018-04-26 DIAGNOSIS — Z794 Long term (current) use of insulin: Secondary | ICD-10-CM | POA: Diagnosis not present

## 2018-04-26 DIAGNOSIS — I5022 Chronic systolic (congestive) heart failure: Secondary | ICD-10-CM

## 2018-04-26 DIAGNOSIS — I255 Ischemic cardiomyopathy: Secondary | ICD-10-CM

## 2018-04-26 DIAGNOSIS — I5042 Chronic combined systolic (congestive) and diastolic (congestive) heart failure: Secondary | ICD-10-CM

## 2018-04-26 DIAGNOSIS — I340 Nonrheumatic mitral (valve) insufficiency: Secondary | ICD-10-CM | POA: Diagnosis not present

## 2018-04-26 DIAGNOSIS — I10 Essential (primary) hypertension: Secondary | ICD-10-CM | POA: Diagnosis not present

## 2018-04-26 DIAGNOSIS — Z9889 Other specified postprocedural states: Secondary | ICD-10-CM | POA: Diagnosis not present

## 2018-04-26 DIAGNOSIS — K219 Gastro-esophageal reflux disease without esophagitis: Secondary | ICD-10-CM | POA: Diagnosis not present

## 2018-04-26 DIAGNOSIS — I25118 Atherosclerotic heart disease of native coronary artery with other forms of angina pectoris: Secondary | ICD-10-CM | POA: Diagnosis not present

## 2018-04-26 DIAGNOSIS — I252 Old myocardial infarction: Secondary | ICD-10-CM | POA: Diagnosis not present

## 2018-04-26 DIAGNOSIS — E1142 Type 2 diabetes mellitus with diabetic polyneuropathy: Secondary | ICD-10-CM | POA: Diagnosis not present

## 2018-04-26 DIAGNOSIS — Z888 Allergy status to other drugs, medicaments and biological substances status: Secondary | ICD-10-CM | POA: Diagnosis not present

## 2018-04-26 DIAGNOSIS — G4700 Insomnia, unspecified: Secondary | ICD-10-CM | POA: Diagnosis not present

## 2018-04-26 DIAGNOSIS — J45909 Unspecified asthma, uncomplicated: Secondary | ICD-10-CM | POA: Diagnosis not present

## 2018-04-26 LAB — CBC
HEMATOCRIT: 36.5 % — AB (ref 40.0–52.0)
Hemoglobin: 12.3 g/dL — ABNORMAL LOW (ref 13.0–18.0)
MCH: 28.8 pg (ref 26.0–34.0)
MCHC: 33.7 g/dL (ref 32.0–36.0)
MCV: 85.4 fL (ref 80.0–100.0)
Platelets: 182 10*3/uL (ref 150–440)
RBC: 4.27 MIL/uL — ABNORMAL LOW (ref 4.40–5.90)
RDW: 14 % (ref 11.5–14.5)
WBC: 7.1 10*3/uL (ref 3.8–10.6)

## 2018-04-26 LAB — BASIC METABOLIC PANEL
Anion gap: 5 (ref 5–15)
BUN: 19 mg/dL (ref 6–20)
CHLORIDE: 103 mmol/L (ref 98–111)
CO2: 29 mmol/L (ref 22–32)
Calcium: 8.9 mg/dL (ref 8.9–10.3)
Creatinine, Ser: 1.14 mg/dL (ref 0.61–1.24)
GFR calc Af Amer: >60 mL/min (ref >60)
GFR calc non Af Amer: >60 mL/min (ref >60)
GLUCOSE: 214 mg/dL — AB (ref 70–99)
POTASSIUM: 4.2 mmol/L (ref 3.5–5.1)
SODIUM: 137 mmol/L (ref 135–145)

## 2018-04-26 LAB — GLUCOSE, CAPILLARY: Glucose-Capillary: 166 mg/dL — ABNORMAL HIGH (ref 70–99)

## 2018-04-26 MED ORDER — INSULIN LISPRO 100 UNIT/ML (KWIKPEN)
7.0000 [IU] | PEN_INJECTOR | Freq: Three times a day (TID) | SUBCUTANEOUS | Status: DC
  Administered 2018-04-26: 7 [IU] via SUBCUTANEOUS

## 2018-04-26 MED ORDER — LIRAGLUTIDE 18 MG/3ML ~~LOC~~ SOPN: PEN_INJECTOR | SUBCUTANEOUS | Status: DC

## 2018-04-26 MED ORDER — CLOPIDOGREL BISULFATE 75 MG PO TABS: 75.0000 mg | ORAL_TABLET | Freq: Every day | ORAL | 6 refills | Status: DC

## 2018-04-26 MED ORDER — SACUBITRIL-VALSARTAN 24-26 MG PO TABS: 1.0000 | ORAL_TABLET | Freq: Two times a day (BID) | ORAL | 6 refills | Status: DC

## 2018-04-26 MED ORDER — FUROSEMIDE 40 MG PO TABS: 40.0000 mg | ORAL_TABLET | Freq: Every day | ORAL | 6 refills | Status: DC

## 2018-04-26 NOTE — Telephone Encounter (Signed)
Lmov for patient to schedule FU and Lab appointment   Will try again at a later time

## 2018-04-26 NOTE — Discharge Summary (Signed)
Discharge Summary    Patient ID: [name],  MRN: [id number], DOB/AGE: October 20, 1960 57 y.o.  Admit date: 04/25/2018 Discharge date: 04/26/2018  Primary Care Provider: [name] Primary Cardiologist: [name], MD  Discharge Diagnoses    Principal Problem:   Unstable angina [facility])  **s/p PCI/DES to the LCX this admission. Active Problems:   Coronary artery disease   DM (diabetes mellitus), type 2, uncontrolled with complications (HCC)   Hypertension   Diabetic peripheral neuropathy (HCC)   Morbid (severe) obesity due to excess calories (HCC)   Ischemic cardiomyopathy  **EF 25-35%.   Chronic systolic CHF (congestive heart failure) (HCC)   GERD (gastroesophageal reflux disease)   Hyperlipidemia   Allergies Allergies  Allergen Reactions  . Metoprolol Rash  . Novolog [Insulin Aspart] Hives    Diagnostic Studies/Procedures    Cardiac Catheterization and Percutaneous Coronary Intervention 7.22.2019  Left Main  Vessel is angiographically normal.  Left Anterior Descending  Prox LAD lesion 20% stenosed  Prox LAD lesion is 20% stenosed. The lesion was previously treated.  Mid LAD lesion 0% stenosed  Previously placed Mid LAD stent (unknown type) is widely patent.  Left Circumflex  Vessel is angiographically normal.  Mid Cx lesion 90% stenosed       **The Mid LCX was successfully treated with a 3.25 x 15 mm [place] DES**  First Obtuse Marginal Branch  Vessel is angiographically normal.  Second Obtuse Marginal Branch  Ost 2nd Mrg to 2nd Mrg lesion 50% stenosed  Ost 2nd Mrg to 2nd Mrg lesion is 50% stenosed.  2nd Mrg lesion 0% stenosed  Previously placed 2nd Mrg stent (unknown type) is widely patent.  Third Obtuse Marginal Branch  Ost 3rd Mrg lesion 40% stenosed  Ost 3rd Mrg lesion is 40% stenosed.  3rd Mrg lesion 0% stenosed  Previously placed 3rd Mrg stent (unknown type) is widely patent.  Right Coronary Artery  Prox RCA lesion 40% stenosed  Prox  RCA lesion is 40% stenosed. The lesion was previously treated.  Mid RCA lesion 20% stenosed  Mid RCA lesion is 20% stenosed. The lesion was previously treated.  Mid RCA to Dist RCA lesion 40% stenosed  Mid RCA to Dist RCA lesion is 40% stenosed.  Dist RCA lesion 0% stenosed  Previously placed Dist RCA stent (unknown type) is widely patent.  Right Posterior Descending Artery  Ost RPDA to RPDA lesion 0% stenosed  Previously placed Ost RPDA to RPDA stent (unknown type) is widely patent.  Right Posterior Atrioventricular Branch  Post Atrio lesion 60% stenosed  Post Atrio lesion is 60% stenosed.   Left Ventricle The left ventricle is mildly dilated. There is severe left ventricular systolic dysfunction. LV end diastolic pressure is severely elevated. The left ventricular ejection fraction is 25-35% by visual estimate. There are LV function abnormalities due to segmental dysfunction. There is mild (2+) mitral regurgitation.   _____________   History of Present Illness     58 year old?with the above complex past medical history including coronary artery disease status post prior myocardial infarction in 2009 with subsequent placement of a total of 10 stents between the LAD and right coronary artery. These were all performed in [place]. Last catheterization March 2016 reportedly showed no obstructive disease and he has been treated medically since. Other history includes hypertension, hyperlipidemia, type 2 diabetes mellitus, diabetic neuropathy, myasthenia gravis, GERD, and sleep apnea on CPAP.    He was recently seen in clinic in late June with report of occasional angina, usually @  rest, and some progression of baseline level of DOE.  An echo was performed and showed a new finding of LV dysfunction with an EF of 30-35%.  In that setting, it was felt that pursuit of diagnostic catheterization was appropriate to re-evaluate his coronary anatomy.  Hospital Course     Consultants: None    Pt presented to the [facility] cardiac catheter[facility] laboratory on 04/25/2018 and underwent diagnostic catheterization revealing patent LAD, OM,and RPDA stents with severe mid-circumflex disease.  The left circumflex was successfully treated with a 3.25 x 15 mm [place] DES.  EF was 25-35% by ventriculography with 2+ mitral regurgitation.  LVEDP was elevated @ 64mHg and he was placed on lasix post-procedure.    Overnight, he slept poorly, mostly related to multiple trips to the bathroom to void (minus 1.9L) but has been ambulating without recurrent chest pain or dyspnea this AM.  R wrist cath site is stable.  We discussed the importance of daily weights, sodium restriction, medication compliance, and symptom reporting and he verbalizes understanding.  In addition to lasix, we have added entresto therapy.  Renal fxn and lytes stable this morning.  He will be discharged home today in good condition and we will f/u a BMET in 1 wk with office f/u in ~ 2 wks.  At that time, we will consider titrating entresto further vs adding eplerenone. _____________ Physical Exam   Discharge Vitals Blood pressure 126/87, pulse 95, temperature 97.8 F (36.6 C), temperature source Oral, resp. rate 16, height _0  (1.702 m), weight 234 lb 12.8 oz (106.5 kg), SpO2 95 %.  Filed Weights   04/25/18 0847 04/25/18 1517  Weight: 231 lb (104.8 kg) 234 lb 12.8 oz (106.5 kg)    GEN: Well nourished, well developed, in no acute distress.  HEENT: Grossly normal.  Neck: Supple, no JVD, carotid bruits, or masses. Cardiac: RRR, no murmurs, rubs, or gallops. No clubbing, cyanosis, edema.  Radials/DP/PT 2+ and equal bilaterally. R wrist cath site w/o bleeding/bruit/hematoma. R wrist cath site w/o bleeding/bruit/hematoma. Respiratory:  Respirations regular and unlabored, clear to auscultation bilaterally. GI: Soft, nontender, nondistended, BS + x 4. MS: no deformity or atrophy. Skin: warm and dry, no rash. Neuro:  Strength and  sensation are intact. Psych: AAOx3.  Normal affect.  Labs & Radiologic Studies    CBC Recent Labs    04/26/18 0452  WBC 7.1  HGB 12.3*  HCT 36.5*  MCV 85.4  PLT 1403  Basic Metabolic Panel Recent Labs    04/26/18 0452  NA 137  K 4.2  CL 103  CO2 29  GLUCOSE 214*  BUN 19  CREATININE 1.14  CALCIUM 8.9  _____________   Disposition   Pt is being discharged home today in good condition.  Follow-up Plans & Appointments   Follow-up Information    AWellington Hampshire MD Follow up on 05/27/2018.   Specialty:  Cardiology Why:  11:40 AM - we will arrange for an ~ 2 wk f/u as well. I've also arranged for a blood [facility] 1 wk from today. Contact information: [id number]          Discharge Instructions    (HEART FAILURE PATIENTS) Call MD:  Anytime you have any of the following symptoms: 1) 3 pound weight gain in 24 hours or 5 pounds in 1 week 2) shortness of breath, with or without a dry hacking cough 3) swelling in  the hands, feet or stomach 4) if you have to sleep on extra pillows at night in order to breathe.   Complete by:  As directed    AMB Referral to Cardiac Rehabilitation - Phase II   Complete by:  As directed    Diagnosis:  Coronary Stents   AMB Referral to Phase II Cardiac Rehab   Complete by:  As directed    Diagnosis:  Coronary Stents   Call MD for:  difficulty breathing, headache or visual disturbances   Complete by:  As directed    Call MD for:  persistant dizziness or light-headedness   Complete by:  As directed    Call MD for:  persistant nausea and vomiting   Complete by:  As directed    Call MD for:  redness, tenderness, or signs of infection (pain, swelling, redness, odor or green/yellow discharge around incision site)   Complete by:  As directed    Call MD for:  temperature >100.4   Complete by:  As directed    Diet - low sodium heart healthy   Complete  by:  As directed    Diet Carb Modified   Complete by:  As directed    Increase activity slowly   Complete by:  As directed       Discharge Medications   Allergies as of 04/26/2018      Reactions   Metoprolol Rash   Novolog [insulin Aspart] Hives      Medication List    STOP taking these medications   glucose blood test strip Commonly known as:  BAYER CONTOUR TEST     TAKE these medications   albuterol (2.5 MG/3ML) 0.083% nebulizer solution Commonly known as:  PROVENTIL Take 2.5 mg by nebulization every 6 (six) hours as needed for wheezing or shortness of breath.   albuterol 108 (90 Base) MCG/ACT inhaler Commonly known as:  PROVENTIL HFA;VENTOLIN HFA Inhale 2 puffs into the lungs every 6 (six) hours as needed for wheezing or shortness of breath.   aspirin EC 81 MG tablet Take 1 tablet (81 mg total) by mouth daily.   carvedilol 3.125 MG tablet Commonly known as:  COREG TAKE 1 TABLET BY MOUTH TWICE DAILY   CLARITIN 10 MG tablet Generic drug:  loratadine Take 10 mg by mouth daily. am   clopidogrel 75 MG tablet Commonly known as:  PLAVIX Take 1 tablet (75 mg total) by mouth daily with breakfast. Start taking on:  04/27/2018   cyanocobalamin 1000 MCG tablet Take 1,000 mcg by mouth daily. am   cyclobenzaprine 10 MG tablet Commonly known as:  FLEXERIL Take 1 tablet (10 mg total) by mouth 2 (two) times daily.   dexlansoprazole 60 MG capsule Commonly known as:  DEXILANT Take 1 capsule (60 mg total) by mouth daily.   ezetimibe 10 MG tablet Commonly known as:  ZETIA Take 1 tablet (10 mg total) by mouth daily.   FIFTY50 PEN NEEDLES 32G X 4 MM Misc Generic drug:  Insulin Pen Needle Inject 1 each into the skin as needed.   fluticasone furoate-vilanterol 200-25 MCG/INH Aepb Commonly known as:  BREO ELLIPTA Inhale 1 puff into the lungs daily.   furosemide 40 MG tablet Commonly known as:  LASIX Take 1 tablet (40 mg total) by mouth daily.   gabapentin 300 MG  capsule Commonly known as:  NEURONTIN Take 1 capsule (300 mg total) by mouth 3 (three) times daily.   gabapentin 800 MG tablet Commonly known as:  NEURONTIN Take  1 tablet (800 mg total) by mouth at bedtime.   HUMALOG KWIKPEN 100 UNIT/ML KiwkPen Generic drug:  insulin lispro Inject 7-8 Units into the skin 3 (three) times daily. Reported on 12/02/2015/ sliding scale 1 unit for every 8 units of carbs; and 1 unit for every 20 above 120   LANTUS SOLOSTAR 100 UNIT/ML Solostar Pen Generic drug:  Insulin Glargine Inject 33-35 Units into the skin daily at 10 pm.   levocetirizine 5 MG tablet Commonly known as:  XYZAL Take 1 tablet (5 mg total) by mouth every evening.   liraglutide 18 MG/3ML Sopn Commonly known as:  VICTOZA 1.8 units every morning What changed:    how much to take  how to take this  when to take this  additional instructions   metFORMIN 1000 MG tablet Commonly known as:  GLUCOPHAGE Take 1,000 mg by mouth 2 (two) times daily with a meal. - resume 04/27/2018   montelukast 10 MG tablet Commonly known as:  SINGULAIR Take 10 mg by mouth at bedtime.   naloxone 4 MG/0.1ML Liqd nasal spray kit Commonly known as:  NARCAN For excess sedation from opioids   nitroGLYCERIN 0.3 MG SL tablet Commonly known as:  NITROSTAT Place 1 tablet (0.3 mg total) under the tongue every 5 (five) minutes as needed for chest pain. Reported on 12/02/2015   Oxycodone HCl 10 MG Tabs Take 1 tablet (10 mg total) by mouth 4 (four) times daily as needed.   pyridostigmine 60 MG tablet Commonly known as:  MESTINON Take 60 mg by mouth 4 (four) times daily.   rosuvastatin 5 MG tablet Commonly known as:  CRESTOR Take 5 mg by mouth daily.   sacubitril-valsartan 24-26 MG Commonly known as:  ENTRESTO Take 1 tablet by mouth 2 (two) times daily.   triamcinolone cream 0.1 % Commonly known as:  KENALOG apply to affected area twice a day UNTIL CLEAR as needed       Outstanding Labs/Studies    F/u bmet in 1 wk.  Duration of Discharge Encounter   Greater than 30 minutes including physician time.  Signed, Murray Hodgkins NP 04/26/2018, 10:06 AM

## 2018-04-26 NOTE — Progress Notes (Signed)
Cardiovascular and Pulmonary Nurse Navigator Note:  20 year oldmalewith PMHx DM type II, HTN, diabetic peripheral neuropathy, myasthenia gravis, sleep apnea on CPAP, MO, ischemic CM with ef of 46-50%, chronic systolic CHF, GERD, HLD, CAD status post prior myocardial infarction in 2009 with subsequent placement of a total of 10 stents between the LAD and right coronary artery. These were all performed in [place]. Last catheterization March 2016 reportedly showed no obstructive disease and he has been treated medically since.   Per Ignacia Bayley, NP note:  Patient was recently seen in clinic in late June with report of occasional angina, usually @ rest, and some progression of baseline level of DOE.  An echo was performed and showed a new finding of LV dysfunction with an EF of 30-35%.  In that setting, it was felt that pursuit of diagnostic catheterization was appropriate to re-evaluate his coronary anatomy.  Pt presented to the [facility] cardiac catheterization laboratory on 04/25/2018 and underwent diagnostic catheterization revealing patent LAD, OM,and RPDA stents with severe mid-circumflex disease.  The left circumflex was successfully treated with a 3.25 x 15 mm [place] DES.  EF was 25-35% by ventriculography with 2+ mitral regurgitation.  LVEDP was elevated @ 38mmHg and he was placed on lasix post-procedure.    EDUCATION completed with patient and son:  Reviewed the location of CAD and where his stent was placed. Informed patient he will be given a stent card. Explained the purpose of the stent card. Instructed patient to keep stent card in his wallet.  ? Discussed modifiable risk factors including controlling blood pressure, cholesterol, and blood sugar; following heart healthy diet; maintaining healthy weight; exercise; and smoking cessation, if applicable.   ? Discussed cardiac medications including rationale for taking, mechanisms of action, and side effects. Stressed the importance of taking  medications as prescribed.  ? Discussed emergency plan for heart attack symptoms. Patient verbalized understanding of need to call 911 and not to drive himself to ER if having cardiac symptoms / chest pain.  ? Heart healthy diet of low sodium, low fat, low cholesterol / carb modified heart healthy diet discussed. Information on diet provided.  ? Smoking Cessation - Patient is a NEVER smoker.    Exercise - Benefits of exercised discussed.  Informed patient that his cardiologist has referred him to outpatient Cardiac Rehab. An overview of the program was provided. Informational letter, class and orientation times, and CPT billing codes given to patient. Patient is interested in participating. Patient plans to check with his insurance company to see what his out-of-pocket expenses will be.   CHF Education:   Provided patient with "Living Better with Heart Failure" packet. Briefly reviewed definition of heart failure and signs and symptoms of an exacerbation.?Explained to patient that HF is a chronic illness which requires self-assessment / self-management along with help from the cardiologist/PCP.?? ? *Reviewed importance of and reason behind checking weight daily in the AM, after using the bathroom, but before getting dressed. Patient has scales.  ? *Reviewed with patient the following information: *Discussed when to call the Dr= weight gain of >2-3lb overnight of 5lb in a week,  *Discussed yellow zone= call MD: weight gain of >2-3lb overnight of 5lb in a week, increased swelling, increased SOB when lying down, chest discomfort, dizziness, increased fatigue *Red Zone= call 911: struggle to breath, fainting or near fainting, significant chest pain   *Reviewed low sodium diet-provided handout of recommended and not recommended foods.?Reviewed reading labels with patient and son.  Patient verbalized understanding of 2000  mg of sodium or less per day.    *Discussed fluid intake with patient as well.  Patient not currently on a fluid restriction, but advised no more than 8-8 ounces glass of fluids per day.? ? *Instructed patient to take medications as prescribed for heart failure. Explained briefly why pt is on the medications (either make you feel better, live longer or keep you out of the hospital) and discussed monitoring and side effects.  ? *Discussed exercise. See above.   ? *Smoking Cessation- Patient is a NEVER smoker.   ? *[facility] - Explained the purpose of the HF Clinic. ?Explained to patient the HF Clinic does not replace PCP nor Cardiologist, but is an additional resource to helping patient manage heart failure at home. Patient has an appointment to be seen in the [facility] on 05/06/2018 at [place].  Patient to see Dr. Fletcher Anon on 05/12/2018.    Again, the 5 Steps to Living Better with Heart Failure were reviewed with patient.  ? Patient thanked me for providing the above information. ?   Roanna Epley, RN, BSN, [place]  [facility] Rehab  Cardiovascular & Pulmonary Nurse Navigator  Direct Line: [phone]  Department Phone #: [phone] Fax: [phone]  Email Address: [email or web address]

## 2018-04-26 NOTE — Telephone Encounter (Signed)
-----   Message from Theora Gianotti, NP sent at 04/26/2018  9:29 AM EDT ----- H,  Good morning.  Can you pls set up Mr. Norlander for a 2 wk f/u with me or Arida?  Not TCM.  Thanks,  Gerald Stabs

## 2018-04-26 NOTE — Telephone Encounter (Signed)
Pt scheduled  05/05/18 for labs 05/12/18 to see Dr Fletcher Anon  Nothing else needed.

## 2018-04-26 NOTE — Discharge Instructions (Signed)
**PLEASE REMEMBER TO BRING ALL OF YOUR MEDICATIONS TO EACH OF YOUR FOLLOW-UP OFFICE VISITS.  NO HEAVY LIFTING OR SEXUAL ACTIVITY X 7 DAYS. NO DRIVING X 3-5 DAYS. NO SOAKING BATHS, HOT TUBS, POOLS, ETC., X 7 DAYS.   Radial Site Care Refer to this sheet in the next few weeks. These instructions provide you with information on caring for yourself after your procedure. Your caregiver may also give you more specific instructions. Your treatment has been planned according to current medical practices, but problems sometimes occur. Call your caregiver if you have any problems or questions after your procedure. HOME CARE INSTRUCTIONS  You may shower the day after the procedure.Remove the bandage (dressing) and gently wash the site with plain soap and water.Gently pat the site dry.   Do not apply powder or lotion to the site.   Do not submerge the affected site in water for 3 to 5 days.   Inspect the site at least twice daily.   Do not flex or bend the affected arm for 24 hours.   No lifting over 5 pounds (2.3 kg) for 5 days after your procedure.   Do not drive home if you are discharged the same day of the procedure. Have someone else drive you.   What to expect:  Any bruising will usually fade within 1 to 2 weeks.   Blood that collects in the tissue (hematoma) may be painful to the touch. It should usually decrease in size and tenderness within 1 to 2 weeks.  SEEK IMMEDIATE MEDICAL CARE IF:  You have unusual pain at the radial site.   You have redness, warmth, swelling, or pain at the radial site.   You have drainage (other than a small amount of blood on the dressing).   You have chills.   You have a fever or persistent symptoms for more than 72 hours.   You have a fever and your symptoms suddenly get worse.   Your arm becomes pale, cool, tingly, or numb.   You have heavy bleeding from the site. Hold pressure on the site.      10 Habits of Highly Healthy People  [facility] wants to help you get well and stay well.  Live a longer, healthier life by practicing healthy habits every day.  1.  Visit your primary care provider regularly. 2.  Make time for family and friends.  Healthy relationships are important. 3.  Take medications as directed by your provider. 4.  Maintain a healthy weight and a trim waistline. 5.  Eat healthy meals and snacks, rich in fruits, vegetables, whole grains, and lean proteins. 6.  Get moving every day - aim for 150 minutes of moderate physical activity each week. 7.  Don't smoke. 8.  Avoid alcohol or drink in moderation. 9.  Manage stress through meditation or mindful relaxation. 10.  Get seven to nine hours of quality sleep each night.  Want more information on healthy habits?  To learn more about these and other healthy habits, visit [email or web address]. _____________       You have received care from [facility] during this hospital stay and we look forward to continuing to provide you with excellent care in our office settings after you've left the hospital.  In order to assure a smoother transition to home following your discharge from the hospital, we will likely have you see one of our nurse practitioners or physician assistants within a few weeks of discharge.  Our   advanced practice providers work closely with your physician in order to address all of your heart's needs in a timely manner.  More information about all of our providers may be found here: [email or web address]  Please plan to bring all of your prescriptions to your follow-up appointment and don't hesitate to contact us with questions or concerns.  [facility] [place] - [phone] [facility] [place] - [phone] [facility] [address] - [phone] [facility] [place] - [phone] [facility] [place] - [phone] [facility] [place] -  [phone] [facility] [place] - [phone] [facility] [place] - [phone] [facility] - [phone]    

## 2018-04-27 ENCOUNTER — Telehealth: Payer: Self-pay | Admitting: Cardiovascular Disease

## 2018-04-27 NOTE — Telephone Encounter (Signed)
Call placed to the patient. He stated that as of last night he started having cramps from his toes to his rib cage. He stated that the cramping is constant and does not subside. He was recently started on 40 mg of Furosemide.   He was instructed while in the hospital, post cath, to restrict his fluid intake to 64 ozs daily which he has been compliant with. He stated that the last time he felt this way he was dehydrated.   Potassium 7/23 was 4.2 and sodium was 137. He has an appointment for a BMET on 8/1. Message routed to the provider for recommendation.

## 2018-04-27 NOTE — Telephone Encounter (Signed)
Check BMP tomorrow am.

## 2018-04-27 NOTE — Telephone Encounter (Signed)
Patient made aware and will come in the morning for labs.

## 2018-04-27 NOTE — Telephone Encounter (Signed)
Patient c/o severe cramps going from legs through whole body.  Per Wife patient fluid intake is limited due to restrictions .  Severe for the last hour.  Started last night .

## 2018-04-28 ENCOUNTER — Other Ambulatory Visit: Payer: Self-pay | Admitting: *Deleted

## 2018-04-28 ENCOUNTER — Other Ambulatory Visit
Admission: RE | Admit: 2018-04-28 | Discharge: 2018-04-28 | Disposition: A | Discharge status: Home / Self Care | Payer: Medicare Other | Source: Ambulatory Visit | Attending: Nurse Practitioner | Admitting: Nurse Practitioner

## 2018-04-28 DIAGNOSIS — I5022 Chronic systolic (congestive) heart failure: Secondary | ICD-10-CM | POA: Insufficient documentation

## 2018-04-28 LAB — BASIC METABOLIC PANEL
ANION GAP: 9 (ref 5–15)
BUN: 24 mg/dL — AB (ref 6–20)
CHLORIDE: 97 mmol/L — AB (ref 98–111)
CO2: 29 mmol/L (ref 22–32)
Calcium: 8.6 mg/dL — ABNORMAL LOW (ref 8.9–10.3)
Creatinine, Ser: 1.56 mg/dL — ABNORMAL HIGH (ref 0.61–1.24)
GFR calc Af Amer: 55 mL/min — ABNORMAL LOW (ref >60)
GFR, EST NON AFRICAN AMERICAN: 47 mL/min — AB (ref >60)
Glucose, Bld: 292 mg/dL — ABNORMAL HIGH (ref 70–99)
POTASSIUM: 4.2 mmol/L (ref 3.5–5.1)
SODIUM: 135 mmol/L (ref 135–145)

## 2018-04-28 MED ORDER — FUROSEMIDE 40 MG PO TABS: 20.0000 mg | ORAL_TABLET | Freq: Every day | ORAL | 6 refills | Status: DC

## 2018-04-29 ENCOUNTER — Encounter: Payer: Self-pay | Admitting: Family Medicine

## 2018-05-05 ENCOUNTER — Telehealth: Payer: Self-pay | Admitting: Cardiovascular Disease

## 2018-05-05 ENCOUNTER — Other Ambulatory Visit: Payer: Medicare Other

## 2018-05-05 DIAGNOSIS — I5022 Chronic systolic (congestive) heart failure: Secondary | ICD-10-CM

## 2018-05-05 DIAGNOSIS — I10 Essential (primary) hypertension: Secondary | ICD-10-CM

## 2018-05-05 DIAGNOSIS — I509 Heart failure, unspecified: Secondary | ICD-10-CM | POA: Insufficient documentation

## 2018-05-05 NOTE — Telephone Encounter (Signed)
Pt would like to have his labs to [facility] lab, please change order.

## 2018-05-05 NOTE — Telephone Encounter (Signed)
Patient has rehab in the morning at [facility] and would like to get lab work at [facility] then instead of here in the office. This is fine. Lab order re-entered. He was appreciative.

## 2018-05-06 ENCOUNTER — Ambulatory Visit: Payer: Medicare Other | Attending: Family | Admitting: Family

## 2018-05-06 ENCOUNTER — Other Ambulatory Visit: Payer: Self-pay | Admitting: *Deleted

## 2018-05-06 ENCOUNTER — Encounter: Payer: Self-pay | Admitting: Family

## 2018-05-06 ENCOUNTER — Encounter: Payer: Self-pay | Admitting: Nurse Practitioner

## 2018-05-06 ENCOUNTER — Other Ambulatory Visit
Admission: RE | Admit: 2018-05-06 | Discharge: 2018-05-06 | Disposition: A | Discharge status: Home / Self Care | Payer: Medicare Other | Source: Ambulatory Visit | Attending: Cardiovascular Disease | Admitting: Cardiovascular Disease

## 2018-05-06 VITALS — BP 93/69 | HR 102 | Resp 18 | Ht 67.0 in | Wt 234.0 lb

## 2018-05-06 DIAGNOSIS — Z8249 Family history of ischemic heart disease and other diseases of the circulatory system: Secondary | ICD-10-CM | POA: Insufficient documentation

## 2018-05-06 DIAGNOSIS — Z7982 Long term (current) use of aspirin: Secondary | ICD-10-CM | POA: Insufficient documentation

## 2018-05-06 DIAGNOSIS — Z955 Presence of coronary angioplasty implant and graft: Secondary | ICD-10-CM | POA: Diagnosis not present

## 2018-05-06 DIAGNOSIS — G4700 Insomnia, unspecified: Secondary | ICD-10-CM | POA: Diagnosis not present

## 2018-05-06 DIAGNOSIS — I110 Hypertensive heart disease with heart failure: Secondary | ICD-10-CM | POA: Insufficient documentation

## 2018-05-06 DIAGNOSIS — K219 Gastro-esophageal reflux disease without esophagitis: Secondary | ICD-10-CM | POA: Insufficient documentation

## 2018-05-06 DIAGNOSIS — Z79899 Other long term (current) drug therapy: Secondary | ICD-10-CM | POA: Insufficient documentation

## 2018-05-06 DIAGNOSIS — Z888 Allergy status to other drugs, medicaments and biological substances status: Secondary | ICD-10-CM | POA: Diagnosis not present

## 2018-05-06 DIAGNOSIS — M545 Low back pain: Secondary | ICD-10-CM | POA: Insufficient documentation

## 2018-05-06 DIAGNOSIS — J45909 Unspecified asthma, uncomplicated: Secondary | ICD-10-CM | POA: Diagnosis not present

## 2018-05-06 DIAGNOSIS — Z833 Family history of diabetes mellitus: Secondary | ICD-10-CM | POA: Insufficient documentation

## 2018-05-06 DIAGNOSIS — I2510 Atherosclerotic heart disease of native coronary artery without angina pectoris: Secondary | ICD-10-CM | POA: Diagnosis not present

## 2018-05-06 DIAGNOSIS — E785 Hyperlipidemia, unspecified: Secondary | ICD-10-CM | POA: Diagnosis not present

## 2018-05-06 DIAGNOSIS — Z7902 Long term (current) use of antithrombotics/antiplatelets: Secondary | ICD-10-CM | POA: Diagnosis not present

## 2018-05-06 DIAGNOSIS — G4733 Obstructive sleep apnea (adult) (pediatric): Secondary | ICD-10-CM | POA: Insufficient documentation

## 2018-05-06 DIAGNOSIS — I10 Essential (primary) hypertension: Secondary | ICD-10-CM | POA: Insufficient documentation

## 2018-05-06 DIAGNOSIS — I5022 Chronic systolic (congestive) heart failure: Secondary | ICD-10-CM | POA: Diagnosis not present

## 2018-05-06 DIAGNOSIS — Z8042 Family history of malignant neoplasm of prostate: Secondary | ICD-10-CM | POA: Insufficient documentation

## 2018-05-06 DIAGNOSIS — E1142 Type 2 diabetes mellitus with diabetic polyneuropathy: Secondary | ICD-10-CM

## 2018-05-06 DIAGNOSIS — Z7951 Long term (current) use of inhaled steroids: Secondary | ICD-10-CM | POA: Diagnosis not present

## 2018-05-06 DIAGNOSIS — G8929 Other chronic pain: Secondary | ICD-10-CM | POA: Diagnosis not present

## 2018-05-06 DIAGNOSIS — I509 Heart failure, unspecified: Secondary | ICD-10-CM | POA: Diagnosis present

## 2018-05-06 DIAGNOSIS — I255 Ischemic cardiomyopathy: Secondary | ICD-10-CM | POA: Diagnosis not present

## 2018-05-06 DIAGNOSIS — G7000 Myasthenia gravis without (acute) exacerbation: Secondary | ICD-10-CM | POA: Insufficient documentation

## 2018-05-06 DIAGNOSIS — Z794 Long term (current) use of insulin: Secondary | ICD-10-CM | POA: Insufficient documentation

## 2018-05-06 DIAGNOSIS — I25118 Atherosclerotic heart disease of native coronary artery with other forms of angina pectoris: Secondary | ICD-10-CM

## 2018-05-06 DIAGNOSIS — I252 Old myocardial infarction: Secondary | ICD-10-CM | POA: Diagnosis not present

## 2018-05-06 LAB — BASIC METABOLIC PANEL
Anion gap: 9 (ref 5–15)
BUN: 31 mg/dL — ABNORMAL HIGH (ref 6–20)
CALCIUM: 9.3 mg/dL (ref 8.9–10.3)
CO2: 29 mmol/L (ref 22–32)
CREATININE: 1.25 mg/dL — AB (ref 0.61–1.24)
Chloride: 103 mmol/L (ref 98–111)
GFR calc non Af Amer: >60 mL/min (ref >60)
Glucose, Bld: 143 mg/dL — ABNORMAL HIGH (ref 70–99)
Potassium: 4.8 mmol/L (ref 3.5–5.1)
SODIUM: 141 mmol/L (ref 135–145)

## 2018-05-06 NOTE — Progress Notes (Signed)
Patient ID: BISHOY CUPP, male    DOB: 01-Oct-1961, 57 y.o.   MRN: [id number]  HPI  Mr Alms is a 57 y/o male with a history of asthma, CAD, DM, hyperlipidemia, HTN,GERD, lyme disease, myasthenia gravis, MI, obstructive sleep apnea, multiple stents and chronic heart failure.   Echo report from 03/31/18 reviewed and showed an EF of 30-35% along with mild MR.   Left heart catheterization done 04/25/18 which showed an EF of 25-35% along with severely elevated left ventricular end-diastolic pressure. DES placed in mid left circumflex.   Admitted 04/25/18 for above catheterization, otherwise, no admissions or ED visits in the last 6 months.  He presents today for his initial visit with a chief complaint of moderate fatigue with minimal exertion. He describes this as chronic in nature having been present for many years. He has associated shortness of breath, intermittent dizziness and chronic back pain along with this. He denies any difficulty sleeping, abdominal distention, palpitations, pedal edema, chest pain, cough or weight gain. Says that he has lyme disease and myasthenia gravis which makes it hard to know what his fatigue is coming from. Going for his initial cardiac rehab consultation today.     Past Medical History:  Diagnosis Date  . Allergy    dust, seasonal (worse in the fall).  . 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 Followed by Pain Specialist in [place], back and neck  . Asthma    BRONCHITIS  . Cataract    First Dx in 2012  . CHF (congestive heart failure) ([place])   . Coronary artery disease    a. Prior Ant MI->s/p multiple stents placed in the LAD and right coronary artery ([place]); b. 2016 Cath: reportedly nonobs dzs;  c. 02/2017 MV: EF 45-54%, ap/ant, ap/inf, apical infarct, no ishcemia; c. 04/2018 Cath/PCI: LM nl, LAD 20p, patent mid stent, LCX 63m(3.25x15 Sierra DES), OM1 nl, OM2 50, OM3 40 w/ patent stent, RCA 40p, 37m, 40d w/ patent stent in RPDA, RPAV 60, EF 25-35%. 2+MR.  . Diabetes  mellitus without complication ([place])    TYPE 2  . Diabetic peripheral neuropathy (HCC)    feet and hands  . GERD (gastroesophageal reflux disease)   . Headache    muscle tension  . Hyperlipidemia   . Hypertension    CONTROLLED ON MEDS  . Insomnia   . Ischemic cardiomyopathy    a. 03/2018 Echo: EF 30-35%, ant, antlat, apical AK, Gr1 DD, mild MR, mildly dil LA.  [name] Lyme disease    Chronic  . Myasthenia gravis ([place])   . Myocardial infarction ([place]) 2010  . Seasonal allergies   . Sleep apnea    CPAP   Past Surgical History:  Procedure Laterality Date  . BILATERAL CARPAL TUNNEL RELEASE Bilateral L in 2012 and R in 2013  . CARDIAC CATHETERIZATION     Several Caths, most recent in  March 2016.  [name] COLONOSCOPY WITH PROPOFOL N/A 01/10/2016   Procedure: COLONOSCOPY WITH PROPOFOL;  Surgeon: [name], MD;  Location: [facility] ENDOSCOPY;  Service: Endoscopy;  Laterality: N/A;  . CORONARY ANGIOPLASTY    . CORONARY STENT INTERVENTION N/A 04/25/2018   Procedure: CORONARY STENT INTERVENTION;  Surgeon: [name], MD;  Location: [place] [facility];  Service: Cardiovascular;  Laterality: N/A;  . ESOPHAGOGASTRODUODENOSCOPY (EGD) WITH PROPOFOL N/A 01/10/2016   Procedure: ESOPHAGOGASTRODUODENOSCOPY (EGD) WITH PROPOFOL;  Surgeon: [name], MD;  Location: [facility] ENDOSCOPY;  Service: Endoscopy;  Laterality: N/A;  . EYE SURGERY Bilateral 2012  cataract/bilateral vitrectomies  . LEFT HEART CATH AND CORONARY ANGIOGRAPHY Left 04/25/2018   Procedure: LEFT HEART CATH AND CORONARY ANGIOGRAPHY;  Surgeon: [name], MD;  Location: [place] [facility];  Service: Cardiovascular;  Laterality: Left;  . TONSILLECTOMY AND ADENOIDECTOMY     As a child  . TUNNELED VENOUS CATHETER PLACEMENT     removed   Family History  Problem Relation Age of Onset  . Diabetes Mother   . Heart disease Mother   . Cancer Father        Prostate CA  . Diabetes Brother   . Healthy Brother   . Healthy Brother    Social  History   Tobacco Use  . Smoking status: Never Smoker  . Smokeless tobacco: Never Used  . Tobacco comment: smoking cessation materials not required  Substance Use Topics  . Alcohol use: Not Currently    Alcohol/week: 0.0 oz   Allergies  Allergen Reactions  . Metoprolol Rash  . Novolog [Insulin Aspart] Hives   Prior to Admission medications   Medication Sig Start Date End Date Taking? Authorizing Provider  albuterol (PROVENTIL HFA;VENTOLIN HFA) 108 (90 Base) MCG/ACT inhaler Inhale 2 puffs into the lungs every 6 (six) hours as needed for wheezing or shortness of breath.   Yes [provider]  albuterol (PROVENTIL) (2.5 MG/3ML) 0.083% nebulizer solution Take 2.5 mg by nebulization every 6 (six) hours as needed for wheezing or shortness of breath.   Yes [provider]  aspirin EC 81 MG tablet Take 1 tablet (81 mg total) by mouth daily. 03/28/18  Yes Theora Gianotti, NP  carvedilol (COREG) 3.125 MG tablet TAKE 1 TABLET BY MOUTH TWICE DAILY 03/02/18  Yes [name], MD  clopidogrel (PLAVIX) 75 MG tablet Take 1 tablet (75 mg total) by mouth daily with breakfast. 04/27/18  Yes Theora Gianotti, NP  cyanocobalamin 1000 MCG tablet Take 1,000 mcg by mouth daily. am   Yes [provider]  cyclobenzaprine (FLEXERIL) 10 MG tablet Take 1 tablet (10 mg total) by mouth 2 (two) times daily. 07/14/17  Yes Molli Barrows, MD  dexlansoprazole (DEXILANT) 60 MG capsule Take 1 capsule (60 mg total) by mouth daily. 12/06/17  Yes [name], MD  ezetimibe (ZETIA) 10 MG tablet Take 1 tablet (10 mg total) by mouth daily. 04/11/18 07/10/18 Yes Theora Gianotti, NP  fluticasone furoate-vilanterol (BREO ELLIPTA) 200-25 MCG/INH AEPB Inhale 1 puff into the lungs daily. 02/08/18  Yes Poulose, Bethel Born, NP  furosemide (LASIX) 40 MG tablet Take 0.5 tablets (20 mg total) by mouth daily. 04/28/18  Yes Theora Gianotti, NP  gabapentin (NEURONTIN) 300 MG capsule  Take 1 capsule (300 mg total) by mouth 3 (three) times daily. 09/25/15  Yes Roselee Nova, MD  gabapentin (NEURONTIN) 800 MG tablet Take 1 tablet (800 mg total) by mouth at bedtime. 09/25/15  Yes Rochel Brome A, MD  insulin lispro (HUMALOG KWIKPEN) 100 UNIT/ML KiwkPen Inject 7-8 Units into the skin 3 (three) times daily. Reported on 12/02/2015/ sliding scale 1 unit for every 8 units of carbs; and 1 unit for every 20 above 120   Yes [provider]  Insulin Pen Needle (FIFTY50 PEN NEEDLES) 32G X 4 MM MISC Inject 1 each into the skin as needed. 02/24/18  Yes [provider]  LANTUS SOLOSTAR 100 UNIT/ML Solostar Pen Inject 33-35 Units into the skin daily at 10 pm.  03/25/16  Yes [provider]  levocetirizine Harlow Ohms)  5 MG tablet Take 1 tablet (5 mg total) by mouth every evening. 02/08/18  Yes Poulose, Bethel Born, NP  liraglutide (VICTOZA) 18 MG/3ML SOPN 1.8 units every morning 04/26/18  Yes Theora Gianotti, NP  loratadine (CLARITIN) 10 MG tablet Take 10 mg by mouth daily. am   Yes [provider]  metFORMIN (GLUCOPHAGE) 1000 MG tablet Take 1,000 mg by mouth 2 (two) times daily with a meal.   Yes [provider]  montelukast (SINGULAIR) 10 MG tablet Take 10 mg by mouth at bedtime.  04/01/16  Yes [provider]  naloxone [facility]) nasal spray 4 mg/0.1 mL For excess sedation from opioids 09/15/16  Yes Molli Barrows, MD  nitroGLYCERIN (NITROSTAT) 0.3 MG SL tablet Place 1 tablet (0.3 mg total) under the tongue every 5 (five) minutes as needed for chest pain. Reported on 12/02/2015 01/25/17  Yes [name], MD  Oxycodone HCl 10 MG TABS Take 1 tablet (10 mg total) by mouth 4 (four) times daily as needed. 03/17/18  Yes Molli Barrows, MD  pyridostigmine (MESTINON) 60 MG tablet Take 60 mg by mouth 4 (four) times daily.   Yes Anabel Bene, MD  rosuvastatin (CRESTOR) 5 MG tablet Take 5 mg by mouth daily. 11/20/17  Yes [name], MD   sacubitril-valsartan (ENTRESTO) 24-26 MG Take 1 tablet by mouth 2 (two) times daily. 04/26/18  Yes Theora Gianotti, NP  triamcinolone cream (KENALOG) 0.1 % apply to affected area twice a day [place] as needed 04/09/17  Yes [provider]    Review of Systems  Constitutional: Positive for fatigue (easily). Negative for appetite change.  HENT: Negative for congestion, postnasal drip and sore throat.   Eyes: Negative.   Respiratory: Positive for shortness of breath. Negative for cough.   Cardiovascular: Negative for chest pain, palpitations and leg swelling.  Gastrointestinal: Negative for abdominal distention and abdominal pain.  Endocrine: Negative.   Genitourinary: Negative.   Musculoskeletal: Positive for back pain (lower left part). Negative for neck pain.  Skin: Negative.   Allergic/Immunologic: Negative.   Neurological: Positive for dizziness (2 days ago). Negative for light-headedness.  Hematological: Negative for adenopathy. Does not bruise/bleed easily.  Psychiatric/Behavioral: Negative for dysphoric mood and sleep disturbance (sleeping on 2 pillows). The patient is not nervous/anxious.     Vitals:   05/06/18 0819  BP: 93/69  Pulse: (!) 102  Resp: 18  SpO2: 99%  Weight: 234 lb (106.1 kg)  Height: 5\' 7"  (1.702 m)   Wt Readings from Last 3 Encounters:  05/06/18 234 lb (106.1 kg)  04/25/18 234 lb 12.8 oz (106.5 kg)  03/28/18 231 lb 12 oz (105.1 kg)   Lab Results  Component Value Date   CREATININE 1.56 (H) 04/28/2018   CREATININE 1.14 04/26/2018   CREATININE 1.18 04/12/2018    Physical Exam  Constitutional: He is oriented to person, place, and time. He appears well-developed and well-nourished.  HENT:  Head: Normocephalic and atraumatic.  Neck: Normal range of motion. Neck supple. No JVD present.  Cardiovascular: Regular rhythm. Tachycardia present.  Pulmonary/Chest: Effort normal. No respiratory distress. He has no wheezes.  Abdominal: Soft.  He exhibits distension.  Musculoskeletal: He exhibits no edema or tenderness.  Neurological: He is alert and oriented to person, place, and time.  Skin: Skin is warm and dry.  Psychiatric: He has a normal mood and affect. His behavior is normal. Thought content normal.  Nursing note and vitals reviewed.   Assessment & Plan:  1: Chronic heart failure with reduced ejection fraction- - NYHA class III - euvolemic today - weighing daily and says that his weight has been stable. Instructed to call for an overnight weight gain of >2 pounds or a weekly weight gain of >5 pounds - not adding salt to his food and has been reading food labels for sodium content. Reviewed the importance of keeping daily sodium intake to 2000mg  and written dietary information was given to him about this - tachycardic today so discussed that we may need to try to titrate up carvedilol if HR remains elevated; if unable to titrate, could consider corlanor - BP today limits titration of entresto - saw cardiology Sharolyn Douglas) 03/28/18 - saw pulmonology Raul Del) 01/13/18 - going to cardiac rehab consultation today  2: HTN- - BP on the low side today; diuretic has been decreased and he's getting lab work done today - saw PCP Uvaldo Rising) 12/26/17 - BMP 04/28/18 reviewed and showed sodium 135, potassium 4.2 and GFR 47  3: DM- - fasting glucose at home today was 122 - saw endocrinology Honor Junes) 02/24/18 - A1c 02/24/18 was 8.0%  Medication list was reviewed.  Return in 1 month or sooner for any questions/problems before then.

## 2018-05-06 NOTE — Patient Instructions (Signed)
Continue weighing daily and call for an overnight weight gain of > 2 pounds or a weekly weight gain of >5 pounds. 

## 2018-05-09 ENCOUNTER — Ambulatory Visit: Payer: Medicare Other | Attending: Anesthesiology | Admitting: Anesthesiology

## 2018-05-09 ENCOUNTER — Other Ambulatory Visit: Payer: Self-pay

## 2018-05-09 ENCOUNTER — Encounter: Payer: Self-pay | Admitting: Anesthesiology

## 2018-05-09 ENCOUNTER — Other Ambulatory Visit: Payer: Self-pay | Admitting: *Deleted

## 2018-05-09 VITALS — BP 139/90 | HR 94 | Temp 98.4°F | Resp 16 | Ht 67.0 in | Wt 226.0 lb

## 2018-05-09 DIAGNOSIS — G7000 Myasthenia gravis without (acute) exacerbation: Secondary | ICD-10-CM

## 2018-05-09 DIAGNOSIS — A6923 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: Secondary | ICD-10-CM

## 2018-05-09 DIAGNOSIS — Z79899 Other long term (current) drug therapy: Secondary | ICD-10-CM | POA: Insufficient documentation

## 2018-05-09 DIAGNOSIS — M47817 Spondylosis without myelopathy or radiculopathy, lumbosacral region: Secondary | ICD-10-CM

## 2018-05-09 DIAGNOSIS — M5136 Other intervertebral disc degeneration, lumbar region: Secondary | ICD-10-CM | POA: Diagnosis not present

## 2018-05-09 DIAGNOSIS — M5431 Sciatica, right side: Secondary | ICD-10-CM

## 2018-05-09 DIAGNOSIS — Z79891 Long term (current) use of opiate analgesic: Secondary | ICD-10-CM | POA: Insufficient documentation

## 2018-05-09 DIAGNOSIS — M545 Low back pain: Secondary | ICD-10-CM | POA: Diagnosis not present

## 2018-05-09 DIAGNOSIS — M51369 Other intervertebral disc degeneration, lumbar region without mention of lumbar back pain or lower extremity pain: Secondary | ICD-10-CM

## 2018-05-09 DIAGNOSIS — G894 Chronic pain syndrome: Secondary | ICD-10-CM

## 2018-05-09 DIAGNOSIS — M5432 Sciatica, left side: Secondary | ICD-10-CM | POA: Diagnosis not present

## 2018-05-09 DIAGNOSIS — F1190 Opioid use, unspecified, uncomplicated: Secondary | ICD-10-CM | POA: Diagnosis not present

## 2018-05-09 DIAGNOSIS — M542 Cervicalgia: Secondary | ICD-10-CM

## 2018-05-09 MED ORDER — CYCLOBENZAPRINE HCL 10 MG PO TABS: 10.0000 mg | ORAL_TABLET | Freq: Two times a day (BID) | ORAL | 5 refills | Status: DC

## 2018-05-09 MED ORDER — OXYCODONE HCL 10 MG PO TABS: 10.0000 mg | ORAL_TABLET | Freq: Four times a day (QID) | ORAL | 0 refills | Status: DC | PRN

## 2018-05-09 NOTE — Patient Instructions (Signed)
You were given 2 prescriptions for oxycodone hcl 10 mg x 2 months to begin filling on 05/22/2018.    Will have Dr [name] call cyclobenzaprine 10 mg.

## 2018-05-09 NOTE — Progress Notes (Signed)
Nursing Pain Medication Assessment:  Safety precautions to be maintained throughout the outpatient stay will include: orient to surroundings, keep bed in low position, maintain call bell within reach at all times, provide assistance with transfer out of bed and ambulation.  Medication Inspection Compliance: Pill count conducted under aseptic conditions, in front of the patient. Neither the pills nor the bottle was removed from the patient's sight at any time. Once count was completed pills were immediately returned to the patient in their original bottle.  Medication: Oxycodone IR Pill/Patch Count: 54 of 120 pills remain Pill/Patch Appearance: Markings consistent with prescribed medication Bottle Appearance: Standard pharmacy container. Clearly labeled. Filled Date: 07 / 19 / 2019 Last Medication intake:  Today

## 2018-05-10 ENCOUNTER — Other Ambulatory Visit: Payer: Self-pay | Admitting: Nurse Practitioner

## 2018-05-10 DIAGNOSIS — J4541 Moderate persistent asthma with (acute) exacerbation: Secondary | ICD-10-CM

## 2018-05-10 NOTE — Progress Notes (Signed)
Subjective:  Patient ID: [name], male    DOB: 12/24/1960  Age: 57 y.o. MRN: [id number]  CC: Back Pain (lower back left side )   Procedure: None  HPI [name] presents for reevaluation.  He was last seen approximately 2 months ago and has been doing reasonably well since then.  The quality characteristic distribution of his low back pain are essentially unchanged although he has had a mild exacerbation over the last 2 weeks.  He was scheduled for a facet injection today however has recently started Plavix secondary to some cardiac issues.  These have worked well for him in the past and generally give him 75% relief frequently lasting 2 months or more.  He is taking his medications as prescribed and is doing well with these.  I have reviewed his narcotic assessment sheet and he continues to derive good functional lifestyle improvement with his medications.  No evidence of illicit or diverting use is noted and he gets good relief with these.  Otherwise he is in his usual state of health today with no new changes mentioned.  Outpatient Medications Prior to Visit  Medication Sig Dispense Refill  . albuterol (PROVENTIL HFA;VENTOLIN HFA) 108 (90 Base) MCG/ACT inhaler Inhale 2 puffs into the lungs every 6 (six) hours as needed for wheezing or shortness of breath.    [name] albuterol (PROVENTIL) (2.5 MG/3ML) 0.083% nebulizer solution Take 2.5 mg by nebulization every 6 (six) hours as needed for wheezing or shortness of breath.    [name] aspirin EC 81 MG tablet Take 1 tablet (81 mg total) by mouth daily. 90 tablet 3  . carvedilol (COREG) 3.125 MG tablet TAKE 1 TABLET BY MOUTH TWICE DAILY 180 tablet 3  . clopidogrel (PLAVIX) 75 MG tablet Take 1 tablet (75 mg total) by mouth daily with breakfast. 30 tablet 6  . cyanocobalamin 1000 MCG tablet Take 1,000 mcg by mouth daily. am    . dexlansoprazole (DEXILANT) 60 MG capsule Take 1 capsule (60 mg total) by mouth daily. 30 capsule 6  . ezetimibe (ZETIA)  10 MG tablet Take 1 tablet (10 mg total) by mouth daily. 90 tablet 3  . furosemide (LASIX) 40 MG tablet Take 0.5 tablets (20 mg total) by mouth daily. 30 tablet 6  . gabapentin (NEURONTIN) 300 MG capsule Take 1 capsule (300 mg total) by mouth 3 (three) times daily. 90 capsule 1  . gabapentin (NEURONTIN) 800 MG tablet Take 1 tablet (800 mg total) by mouth at bedtime. 90 tablet 0  . insulin lispro (HUMALOG KWIKPEN) 100 UNIT/ML KiwkPen Inject 7-8 Units into the skin 3 (three) times daily. Reported on 12/02/2015/ sliding scale 1 unit for every 8 units of carbs; and 1 unit for every 20 above 120    . Insulin Pen Needle (FIFTY50 PEN NEEDLES) 32G X 4 MM MISC Inject 1 each into the skin as needed.    [name] LANTUS SOLOSTAR 100 UNIT/ML Solostar Pen Inject 33-35 Units into the skin daily at 10 pm.   0  . levocetirizine (XYZAL) 5 MG tablet Take 1 tablet (5 mg total) by mouth every evening. 30 tablet 3  . liraglutide (VICTOZA) 18 MG/3ML SOPN 1.8 units every morning    . loratadine (CLARITIN) 10 MG tablet Take 10 mg by mouth daily. am    . metFORMIN (GLUCOPHAGE) 1000 MG tablet Take 1,000 mg by mouth 2 (two) times daily with a meal.    . montelukast (SINGULAIR) 10 MG tablet Take 10 mg by  mouth at bedtime.   1  . naloxone (NARCAN) nasal spray 4 mg/0.1 mL For excess sedation from opioids 1 kit 2  . nitroGLYCERIN (NITROSTAT) 0.3 MG SL tablet Place 1 tablet (0.3 mg total) under the tongue every 5 (five) minutes as needed for chest pain. Reported on 12/02/2015 25 tablet 3  . pyridostigmine (MESTINON) 60 MG tablet Take 60 mg by mouth 4 (four) times daily.    . rosuvastatin (CRESTOR) 5 MG tablet Take 5 mg by mouth daily.  0  . sacubitril-valsartan (ENTRESTO) 24-26 MG Take 1 tablet by mouth 2 (two) times daily. 60 tablet 6  . triamcinolone cream (KENALOG) 0.1 % apply to affected area twice a day UNTIL CLEAR as needed  0  . cyclobenzaprine (FLEXERIL) 10 MG tablet Take 1 tablet (10 mg total) by mouth 2 (two) times daily. 60  tablet 5  . fluticasone furoate-vilanterol (BREO ELLIPTA) 200-25 MCG/INH AEPB Inhale 1 puff into the lungs daily. 1 each 2  . Oxycodone HCl 10 MG TABS Take 1 tablet (10 mg total) by mouth 4 (four) times daily as needed. 120 tablet 0   No facility-administered medications prior to visit.     Review of Systems CNS: No confusion or sedation Cardiac: No angina or palpitations GI: No abdominal pain or constipation Constitutional: No nausea vomiting fevers or chills  Objective:  BP 139/90 (BP Location: Left Arm, Patient Position: Sitting, Cuff Size: Normal)   Pulse 94   Temp 98.4 F (36.9 C) (Oral)   Resp 16   Ht '5\' 7"'$  (1.702 m)   Wt 226 lb (102.5 kg)   SpO2 100%   BMI 35.40 kg/m    BP Readings from Last 3 Encounters:  05/09/18 139/90  05/06/18 93/69  04/26/18 126/87     Wt Readings from Last 3 Encounters:  05/09/18 226 lb (102.5 kg)  05/06/18 234 lb (106.1 kg)  04/25/18 234 lb 12.8 oz (106.5 kg)     Physical Exam Pt is alert and oriented PERRL EOMI HEART IS RRR no murmur or rub LCTA no wheezing or rales MUSCULOSKELETAL reveals some paraspinous muscle tenderness in the low back but no overt trigger points.  He still has some pain with extension and lateral rotation worse on the left side.  His muscle tone and bulk to the lower extremities is unchanged with a mildly antalgic gait present.  Labs  Lab Results  Component Value Date   HGBA1C 7.2 01/25/2018   HGBA1C 8.1 08/22/2015   Lab Results  Component Value Date   MICROALBUR Neg 01/25/2018   LDLCALC 62 01/17/2018   CREATININE 1.25 (H) 05/06/2018    -------------------------------------------------------------------------------------------------------------------- Lab Results  Component Value Date   WBC 7.1 04/26/2018   HGB 12.3 (L) 04/26/2018   HCT 36.5 (L) 04/26/2018   PLT 182 04/26/2018   GLUCOSE 143 (H) 05/06/2018   CHOL 113 01/17/2018   TRIG 92 01/17/2018   HDL 33 (L) 01/17/2018   LDLCALC 62  01/17/2018   ALT 27 01/17/2018   AST 24 01/17/2018   NA 141 05/06/2018   K 4.8 05/06/2018   CL 103 05/06/2018   CREATININE 1.25 (H) 05/06/2018   BUN 31 (H) 05/06/2018   CO2 29 05/06/2018   TSH 4.130 11/25/2017   HGBA1C 7.2 01/25/2018   MICROALBUR Neg 01/25/2018    --------------------------------------------------------------------------------------------------------------------- No results found.   Assessment & Plan:   Vahan was seen today for back pain.  Diagnoses and all orders for this visit:  Cervicalgia  Facet arthritis  of lumbosacral region  Chronic pain syndrome  Chronic, continuous use of opioids  DDD (degenerative disc disease), lumbar  Myasthenia gravis (HCC)  Lyme arthritis of multiple joints (HCC)  Bilateral sciatica  Other orders -     Discontinue: Oxycodone HCl 10 MG TABS; Take 1 tablet (10 mg total) by mouth 4 (four) times daily as needed. -     Oxycodone HCl 10 MG TABS; Take 1 tablet (10 mg total) by mouth 4 (four) times daily as needed.        ----------------------------------------------------------------------------------------------------------------------  Problem List Items Addressed This Visit      Unprioritized   Myasthenia gravis ([place])    Other Visit Diagnoses    Cervicalgia    -  Primary   Facet arthritis of lumbosacral region       Relevant Medications   Oxycodone HCl 10 MG TABS   Chronic pain syndrome       Chronic, continuous use of opioids       DDD (degenerative disc disease), lumbar       Relevant Medications   Oxycodone HCl 10 MG TABS   Lyme arthritis of multiple joints (HCC)       Relevant Medications   Oxycodone HCl 10 MG TABS   Bilateral sciatica            ----------------------------------------------------------------------------------------------------------------------  1. Cervicalgia Continue with stretching strengthening exercises as previously reviewed for the upper extremities.  2. Facet  arthritis of lumbosacral region We will schedule him for a repeat facet block in 2 months.  He will need to determine whether he can come off for the Plavix 7 days in advance of his next procedure.  3. Chronic pain syndrome Continue back stretching strengthening exercises as reviewed previously  4. Chronic, continuous use of opioids We have reviewed the [facility] practitioner database information and it is appropriate.  We will give refills for his oxycodone dated August 18 and September 17.  5. DDD (degenerative disc disease), lumbar As above  6. Myasthenia gravis ([place]) Continue follow-up with his primary care physicians  7. Lyme arthritis of multiple joints (HCC) As above  8. Bilateral sciatica     ----------------------------------------------------------------------------------------------------------------------  I am having Kris Hartmann. [place] "[name]" maintain his insulin lispro, pyridostigmine, loratadine, albuterol, gabapentin, gabapentin, metFORMIN, cyanocobalamin, albuterol, montelukast, LANTUS SOLOSTAR, naloxone, nitroGLYCERIN, triamcinolone cream, rosuvastatin, dexlansoprazole, levocetirizine, Insulin Pen Needle, carvedilol, ezetimibe, aspirin EC, clopidogrel, sacubitril-valsartan, liraglutide, furosemide, and Oxycodone HCl.   Meds ordered this encounter  Medications  . DISCONTD: Oxycodone HCl 10 MG TABS    Sig: Take 1 tablet (10 mg total) by mouth 4 (four) times daily as needed.    Dispense:  120 tablet    Refill:  0    Do not fill until [id number]  . Oxycodone HCl 10 MG TABS    Sig: Take 1 tablet (10 mg total) by mouth 4 (four) times daily as needed.    Dispense:  120 tablet    Refill:  0    Do not fill until [id number]   Patient's Medications  New Prescriptions   No medications on file  Previous Medications   ALBUTEROL (PROVENTIL HFA;VENTOLIN HFA) 108 (90 BASE) MCG/ACT INHALER    Inhale 2 puffs into the lungs every 6 (six) hours as needed for wheezing or  shortness of breath.   ALBUTEROL (PROVENTIL) (2.5 MG/3ML) 0.083% NEBULIZER SOLUTION    Take 2.5 mg by nebulization every 6 (six) hours as needed for wheezing or shortness of breath.   ASPIRIN  EC 81 MG TABLET    Take 1 tablet (81 mg total) by mouth daily.   CARVEDILOL (COREG) 3.125 MG TABLET    TAKE 1 TABLET BY MOUTH TWICE DAILY   CLOPIDOGREL (PLAVIX) 75 MG TABLET    Take 1 tablet (75 mg total) by mouth daily with breakfast.   CYANOCOBALAMIN 1000 MCG TABLET    Take 1,000 mcg by mouth daily. am   DEXLANSOPRAZOLE (DEXILANT) 60 MG CAPSULE    Take 1 capsule (60 mg total) by mouth daily.   EZETIMIBE (ZETIA) 10 MG TABLET    Take 1 tablet (10 mg total) by mouth daily.   FUROSEMIDE (LASIX) 40 MG TABLET    Take 0.5 tablets (20 mg total) by mouth daily.   GABAPENTIN (NEURONTIN) 300 MG CAPSULE    Take 1 capsule (300 mg total) by mouth 3 (three) times daily.   GABAPENTIN (NEURONTIN) 800 MG TABLET    Take 1 tablet (800 mg total) by mouth at bedtime.   INSULIN LISPRO (HUMALOG KWIKPEN) 100 UNIT/ML KIWKPEN    Inject 7-8 Units into the skin 3 (three) times daily. Reported on 12/02/2015/ sliding scale 1 unit for every 8 units of carbs; and 1 unit for every 20 above 120   INSULIN PEN NEEDLE (FIFTY50 PEN NEEDLES) 32G X 4 MM MISC    Inject 1 each into the skin as needed.   LANTUS SOLOSTAR 100 UNIT/ML SOLOSTAR PEN    Inject 33-35 Units into the skin daily at 10 pm.    LEVOCETIRIZINE (XYZAL) 5 MG TABLET    Take 1 tablet (5 mg total) by mouth every evening.   LIRAGLUTIDE (VICTOZA) 18 MG/3ML SOPN    1.8 units every morning   LORATADINE (CLARITIN) 10 MG TABLET    Take 10 mg by mouth daily. am   METFORMIN (GLUCOPHAGE) 1000 MG TABLET    Take 1,000 mg by mouth 2 (two) times daily with a meal.   MONTELUKAST (SINGULAIR) 10 MG TABLET    Take 10 mg by mouth at bedtime.    NALOXONE (NARCAN) NASAL SPRAY 4 MG/0.1 ML    For excess sedation from opioids   NITROGLYCERIN (NITROSTAT) 0.3 MG SL TABLET    Place 1 tablet (0.3 mg total)  under the tongue every 5 (five) minutes as needed for chest pain. Reported on 12/02/2015   PYRIDOSTIGMINE (MESTINON) 60 MG TABLET    Take 60 mg by mouth 4 (four) times daily.   ROSUVASTATIN (CRESTOR) 5 MG TABLET    Take 5 mg by mouth daily.   SACUBITRIL-VALSARTAN (ENTRESTO) 24-26 MG    Take 1 tablet by mouth 2 (two) times daily.   TRIAMCINOLONE CREAM (KENALOG) 0.1 %    apply to affected area twice a day UNTIL CLEAR as needed  Modified Medications   Modified Medication Previous Medication   BREO ELLIPTA 200-25 MCG/INH AEPB fluticasone furoate-vilanterol (BREO ELLIPTA) 200-25 MCG/INH AEPB      INHALE 1 PUFF INTO THE LUNGS DAILY    Inhale 1 puff into the lungs daily.   CYCLOBENZAPRINE (FLEXERIL) 10 MG TABLET cyclobenzaprine (FLEXERIL) 10 MG tablet      Take 1 tablet (10 mg total) by mouth 2 (two) times daily.    Take 1 tablet (10 mg total) by mouth 2 (two) times daily.   OXYCODONE HCL 10 MG TABS Oxycodone HCl 10 MG TABS      Take 1 tablet (10 mg total) by mouth 4 (four) times daily as needed.    Take 1 tablet (10  mg total) by mouth 4 (four) times daily as needed.  Discontinued Medications   No medications on file   ----------------------------------------------------------------------------------------------------------------------  Follow-up: Return in about 2 months (around 07/09/2018) for evaluation, med refill.    Molli Barrows, MD

## 2018-05-12 ENCOUNTER — Encounter: Payer: Self-pay | Admitting: Cardiovascular Disease

## 2018-05-12 ENCOUNTER — Ambulatory Visit: Payer: Medicare Other | Admitting: Cardiovascular Disease

## 2018-05-12 ENCOUNTER — Encounter: Payer: Medicare Other | Attending: Cardiovascular Disease | Admitting: *Deleted

## 2018-05-12 ENCOUNTER — Encounter: Payer: Self-pay | Admitting: *Deleted

## 2018-05-12 VITALS — Ht 68.0 in | Wt 232.2 lb

## 2018-05-12 VITALS — BP 118/64 | HR 100 | Ht 67.0 in | Wt 232.8 lb

## 2018-05-12 DIAGNOSIS — I10 Essential (primary) hypertension: Secondary | ICD-10-CM | POA: Diagnosis not present

## 2018-05-12 DIAGNOSIS — I110 Hypertensive heart disease with heart failure: Secondary | ICD-10-CM | POA: Insufficient documentation

## 2018-05-12 DIAGNOSIS — Z7982 Long term (current) use of aspirin: Secondary | ICD-10-CM | POA: Diagnosis not present

## 2018-05-12 DIAGNOSIS — Z79899 Other long term (current) drug therapy: Secondary | ICD-10-CM | POA: Diagnosis not present

## 2018-05-12 DIAGNOSIS — Z794 Long term (current) use of insulin: Secondary | ICD-10-CM | POA: Diagnosis not present

## 2018-05-12 DIAGNOSIS — I25118 Atherosclerotic heart disease of native coronary artery with other forms of angina pectoris: Secondary | ICD-10-CM | POA: Diagnosis not present

## 2018-05-12 DIAGNOSIS — I5022 Chronic systolic (congestive) heart failure: Secondary | ICD-10-CM | POA: Insufficient documentation

## 2018-05-12 DIAGNOSIS — E785 Hyperlipidemia, unspecified: Secondary | ICD-10-CM

## 2018-05-12 DIAGNOSIS — E1142 Type 2 diabetes mellitus with diabetic polyneuropathy: Secondary | ICD-10-CM | POA: Diagnosis not present

## 2018-05-12 DIAGNOSIS — Z955 Presence of coronary angioplasty implant and graft: Secondary | ICD-10-CM | POA: Insufficient documentation

## 2018-05-12 DIAGNOSIS — E669 Obesity, unspecified: Secondary | ICD-10-CM | POA: Diagnosis not present

## 2018-05-12 DIAGNOSIS — J45909 Unspecified asthma, uncomplicated: Secondary | ICD-10-CM | POA: Insufficient documentation

## 2018-05-12 DIAGNOSIS — Z79890 Hormone replacement therapy (postmenopausal): Secondary | ICD-10-CM | POA: Diagnosis not present

## 2018-05-12 MED ORDER — CARVEDILOL 6.25 MG PO TABS: 6.2500 mg | ORAL_TABLET | Freq: Two times a day (BID) | ORAL | 3 refills | Status: DC

## 2018-05-12 NOTE — Progress Notes (Signed)
Cardiac Individual Treatment Plan  Patient Details  Name: [name] MRN: [id number] Date of Birth: 1960/11/21 Referring Provider:     Cardiac Rehab from 05/12/2018 in [facility] Cardiac and Pulmonary Rehab  Referring Provider  Arida      Initial Encounter Date:    Cardiac Rehab from 05/12/2018 in [facility] Cardiac and Pulmonary Rehab  Date  05/12/18      Visit Diagnosis: Status post coronary artery stent placement  Patient's Home Medications on Admission:  Current Outpatient Medications:  .  albuterol (PROVENTIL HFA;VENTOLIN HFA) 108 (90 Base) MCG/ACT inhaler, Inhale 2 puffs into the lungs every 6 (six) hours as needed for wheezing or shortness of breath., Disp: , Rfl:  .  albuterol (PROVENTIL) (2.5 MG/3ML) 0.083% nebulizer solution, Take 2.5 mg by nebulization every 6 (six) hours as needed for wheezing or shortness of breath., Disp: , Rfl:  .  aspirin EC 81 MG tablet, Take 1 tablet (81 mg total) by mouth daily., Disp: 90 tablet, Rfl: 3 .  BREO ELLIPTA 200-25 MCG/INH AEPB, INHALE 1 PUFF INTO THE LUNGS DAILY, Disp: 1 each, Rfl: 0 .  carvedilol (COREG) 6.25 MG tablet, Take 1 tablet (6.25 mg total) by mouth 2 (two) times daily., Disp: 180 tablet, Rfl: 3 .  clopidogrel (PLAVIX) 75 MG tablet, Take 1 tablet (75 mg total) by mouth daily with breakfast., Disp: 30 tablet, Rfl: 6 .  cyanocobalamin 1000 MCG tablet, Take 1,000 mcg by mouth daily. am, Disp: , Rfl:  .  cyclobenzaprine (FLEXERIL) 10 MG tablet, Take 1 tablet (10 mg total) by mouth 2 (two) times daily., Disp: 60 tablet, Rfl: 5 .  dexlansoprazole (DEXILANT) 60 MG capsule, Take 1 capsule (60 mg total) by mouth daily., Disp: 30 capsule, Rfl: 6 .  ezetimibe (ZETIA) 10 MG tablet, Take 1 tablet (10 mg total) by mouth daily., Disp: 90 tablet, Rfl: 3 .  furosemide (LASIX) 40 MG tablet, Take 0.5 tablets (20 mg total) by mouth daily., Disp: 30 tablet, Rfl: 6 .  gabapentin (NEURONTIN) 300 MG capsule, Take 1 capsule (300 mg total) by mouth 3 (three)  times daily., Disp: 90 capsule, Rfl: 1 .  gabapentin (NEURONTIN) 800 MG tablet, Take 1 tablet (800 mg total) by mouth at bedtime., Disp: 90 tablet, Rfl: 0 .  insulin lispro (HUMALOG KWIKPEN) 100 UNIT/ML KiwkPen, Inject 7-8 Units into the skin 3 (three) times daily. Reported on 12/02/2015/ sliding scale 1 unit for every 8 units of carbs; and 1 unit for every 20 above 120, Disp: , Rfl:  .  Insulin Pen Needle (FIFTY50 PEN NEEDLES) 32G X 4 MM MISC, Inject 1 each into the skin as needed., Disp: , Rfl:  .  LANTUS SOLOSTAR 100 UNIT/ML Solostar Pen, Inject 33-35 Units into the skin daily at 10 pm. , Disp: , Rfl: 0 .  levocetirizine (XYZAL) 5 MG tablet, Take 1 tablet (5 mg total) by mouth every evening., Disp: 30 tablet, Rfl: 3 .  liraglutide (VICTOZA) 18 MG/3ML SOPN, 1.8 units every morning, Disp: , Rfl:  .  loratadine (CLARITIN) 10 MG tablet, Take 10 mg by mouth daily. am, Disp: , Rfl:  .  metFORMIN (GLUCOPHAGE) 1000 MG tablet, Take 1,000 mg by mouth 2 (two) times daily with a meal., Disp: , Rfl:  .  montelukast (SINGULAIR) 10 MG tablet, Take 10 mg by mouth at bedtime. , Disp: , Rfl: 1 .  naloxone (NARCAN) nasal spray 4 mg/0.1 mL, For excess sedation from opioids, Disp: 1 kit, Rfl: 2 .  nitroGLYCERIN (  NITROSTAT) 0.3 MG SL tablet, Place 1 tablet (0.3 mg total) under the tongue every 5 (five) minutes as needed for chest pain. Reported on 12/02/2015, Disp: 25 tablet, Rfl: 3 .  Oxycodone HCl 10 MG TABS, Take 1 tablet (10 mg total) by mouth 4 (four) times daily as needed., Disp: 120 tablet, Rfl: 0 .  pyridostigmine (MESTINON) 60 MG tablet, Take 60 mg by mouth 4 (four) times daily., Disp: , Rfl:  .  rosuvastatin (CRESTOR) 5 MG tablet, Take 5 mg by mouth daily., Disp: , Rfl: 0 .  sacubitril-valsartan (ENTRESTO) 24-26 MG, Take 1 tablet by mouth 2 (two) times daily., Disp: 60 tablet, Rfl: 6 .  triamcinolone cream (KENALOG) 0.1 %, apply to affected area twice a day UNTIL CLEAR as needed, Disp: , Rfl: 0  Past Medical  History: Past Medical History:  Diagnosis Date  . Allergy    dust, seasonal (worse in the fall).  . 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 Followed by Pain Specialist in [place], back and neck  . Asthma    BRONCHITIS  . Cataract    First Dx in 2012  . CHF (congestive heart failure) ([place])   . Coronary artery disease    a. Prior Ant MI->s/p multiple stents placed in the LAD and right coronary artery ([place]); b. 2016 Cath: reportedly nonobs dzs;  c. 02/2017 MV: EF 45-54%, ap/ant, ap/inf, apical infarct, no ishcemia; c. 04/2018 Cath/PCI: LM nl, LAD 20p, patent mid stent, LCX [id number] Sierra DES), OM1 nl, OM2 50, OM3 40 w/ patent stent, RCA 40p, 234m40d w/ patent stent in RPDA, RPAV 60, EF 25-35%. 2+MR.  . Diabetes mellitus without complication ([place]   TYPE 2  . Diabetic peripheral neuropathy (H[facility])    feet and hands  . GERD (gastroesophageal reflux disease)   . Headache    muscle tension  . Hyperlipidemia   . Hypertension    CONTROLLED ON MEDS  . Insomnia   . Ischemic cardiomyopathy    a. 03/2018 Echo: EF 30-35%, ant, antlat, apical AK, Gr1 DD, mild MR, mildly dil LA.  . Marland Kitchenyme disease    Chronic  . Myasthenia gravis ([facility]  . Myocardial infarction ([facility]  . Seasonal allergies   . Sleep apnea    CPAP    Tobacco Use: Social History   Tobacco Use  Smoking Status Never Smoker  Smokeless Tobacco Never Used  Tobacco Comment   smoking cessation materials not required    Labs: Recent Review Flowsheet Data    Labs for ITP Cardiac and Pulmonary Rehab Latest Ref Rng & Units 08/22/2015 11/29/2017 01/17/2018 01/25/2018   Cholestrol 100 - 199 mg/dL - 141 113 -   LDLCALC 0 - 99 mg/dL - 77 62 -   HDL >39 mg/dL - 38(L) 33(L) -   Trlycerides 0 - 149 mg/dL - 130 92 -   Hemoglobin A1c - 8.1 - - 7.2       Exercise Target Goals: Date: 05/12/18  Exercise Program Goal: Individual exercise prescription set using results from initial 6 min walk test and THRR while considering  patient's  activity barriers and safety.   Exercise Prescription Goal: Initial exercise prescription builds to 30-45 minutes a day of aerobic activity, 2-3 days per week.  Home exercise guidelines will be given to patient during program as part of exercise prescription that the participant will acknowledge.  Activity Barriers & Risk Stratification: Activity Barriers & Cardiac Risk Stratification - 05/12/18 1213  Activity Barriers & Cardiac Risk Stratification   Activity Barriers  Arthritis;Back Problems;Assistive Device;Neck/Spine Problems;Shortness of Breath;Muscular Weakness    Cardiac Risk Stratification  High       6 Minute Walk: 6 Minute Walk    Row Name 05/12/18 1353         6 Minute Walk   Distance  1224 feet     Walk Time  6 minutes     # of Rest Breaks  0     MPH  2.32     METS  3.08     RPE  12     Perceived Dyspnea   2     VO2 Peak  10.8     Symptoms  No     Resting HR  97 bpm     Resting BP  126/72     Resting Oxygen Saturation   92 %     Exercise Oxygen Saturation  during 6 min walk  87 %     Max Ex. HR  116 bpm     Max Ex. BP  126/64     2 Minute Post BP  120/64        Oxygen Initial Assessment:   Oxygen Re-Evaluation:   Oxygen Discharge (Final Oxygen Re-Evaluation):   Initial Exercise Prescription: Initial Exercise Prescription - 05/12/18 1300      Date of Initial Exercise RX and Referring Provider   Date  05/12/18    Referring Provider  Arida      Treadmill   MPH  2.3    Grade  1    Minutes  15    METs  3.08      Recumbant Bike   Level  5    RPM  60    Watts  35    Minutes  15    METs  3      T5 Nustep   Level  2    SPM  80    Minutes  15    METs  3      Prescription Details   Frequency (times per week)  3    Duration  Progress to 30 minutes of continuous aerobic without signs/symptoms of physical distress      Intensity   THRR 40-80% of Max Heartrate  123-149    Ratings of Perceived Exertion  11-13    Perceived Dyspnea  0-4       Resistance Training   Training Prescription  Yes    Weight  4 lb    Reps  10-15       Perform Capillary Blood Glucose checks as needed.  Exercise Prescription Changes:   Exercise Comments:   Exercise Goals and Review: Exercise Goals    Row Name 05/12/18 1352             Exercise Goals   Increase Physical Activity  Yes       Intervention  Provide advice, education, support and counseling about physical activity/exercise needs.;Develop an individualized exercise prescription for aerobic and resistive training based on initial evaluation findings, risk stratification, comorbidities and participant's personal goals.       Expected Outcomes  Short Term: Attend rehab on a regular basis to increase amount of physical activity.;Long Term: Add in home exercise to make exercise part of routine and to increase amount of physical activity.;Long Term: Exercising regularly at least 3-5 days a week.       Increase Strength and Stamina  Yes  Intervention  Provide advice, education, support and counseling about physical activity/exercise needs.;Develop an individualized exercise prescription for aerobic and resistive training based on initial evaluation findings, risk stratification, comorbidities and participant's personal goals.       Expected Outcomes  Short Term: Increase workloads from initial exercise prescription for resistance, speed, and METs.;Short Term: Perform resistance training exercises routinely during rehab and add in resistance training at home;Long Term: Improve cardiorespiratory fitness, muscular endurance and strength as measured by increased METs and functional capacity (6MWT)       Able to understand and use rate of perceived exertion (RPE) scale  Yes       Intervention  Provide education and explanation on how to use RPE scale       Expected Outcomes  Short Term: Able to use RPE daily in rehab to express subjective intensity level;Long Term:  Able to use RPE to guide  intensity level when exercising independently       Able to understand and use Dyspnea scale  Yes       Intervention  Provide education and explanation on how to use Dyspnea scale       Expected Outcomes  Short Term: Able to use Dyspnea scale daily in rehab to express subjective sense of shortness of breath during exertion;Long Term: Able to use Dyspnea scale to guide intensity level when exercising independently       Knowledge and understanding of Target Heart Rate Range (THRR)  Yes       Intervention  Provide education and explanation of THRR including how the numbers were predicted and where they are located for reference       Expected Outcomes  Short Term: Able to state/look up THRR;Long Term: Able to use THRR to govern intensity when exercising independently;Short Term: Able to use daily as guideline for intensity in rehab       Able to check pulse independently  Yes       Intervention  Provide education and demonstration on how to check pulse in carotid and radial arteries.;Review the importance of being able to check your own pulse for safety during independent exercise       Expected Outcomes  Short Term: Able to explain why pulse checking is important during independent exercise;Long Term: Able to check pulse independently and accurately       Understanding of Exercise Prescription  Yes       Intervention  Provide education, explanation, and written materials on patient's individual exercise prescription       Expected Outcomes  Short Term: Able to explain program exercise prescription;Long Term: Able to explain home exercise prescription to exercise independently          Exercise Goals Re-Evaluation :   Discharge Exercise Prescription (Final Exercise Prescription Changes):   Nutrition:  Target Goals: Understanding of nutrition guidelines, daily intake of sodium <1576m, cholesterol <2042m calories 30% from fat and 7% or less from saturated fats, daily to have 5 or more servings  of fruits and vegetables.  Biometrics: Pre Biometrics - 05/12/18 1351      Pre Biometrics   Height  5' 8" (1.727 m)    Weight  232 lb 3.2 oz (105.3 kg)    Waist Circumference  47 inches    Hip Circumference  45 inches    Waist to Hip Ratio  1.04 %    BMI (Calculated)  35.31    Single Leg Stand  1 seconds  Nutrition Therapy Plan and Nutrition Goals: Nutrition Therapy & Goals - 05/12/18 1220      Intervention Plan   Intervention  Prescribe, educate and counsel regarding individualized specific dietary modifications aiming towards targeted core components such as weight, hypertension, lipid management, diabetes, heart failure and other comorbidities.    Expected Outcomes  Short Term Goal: Understand basic principles of dietary content, such as calories, fat, sodium, cholesterol and nutrients.;Short Term Goal: A plan has been developed with personal nutrition goals set during dietitian appointment.;Long Term Goal: Adherence to prescribed nutrition plan.       Nutrition Assessments: Nutrition Assessments - 05/12/18 1221      MEDFICTS Scores   Pre Score  55       Nutrition Goals Re-Evaluation:   Nutrition Goals Discharge (Final Nutrition Goals Re-Evaluation):   Psychosocial: Target Goals: Acknowledge presence or absence of significant depression and/or stress, maximize coping skills, provide positive support system. Participant is able to verbalize types and ability to use techniques and skills needed for reducing stress and depression.   Initial Review & Psychosocial Screening: Initial Psych Review & Screening - 05/12/18 1216      Initial Review   Current issues with  Current Stress Concerns    Source of Stress Concerns  Chronic Illness;Unable to perform yard/household activities;Family    Comments  Household has changed: children moved back home. Good and bad stress with his. Son is helpful; new chaos in house.       Family Dynamics   Good Support System?  Yes       Barriers   Psychosocial barriers to participate in program  There are no identifiable barriers or psychosocial needs.;The patient should benefit from training in stress management and relaxation.      Screening Interventions   Interventions  Encouraged to exercise;To provide support and resources with identified psychosocial needs;Provide feedback about the scores to participant    Expected Outcomes  Short Term goal: Utilizing psychosocial counselor, staff and physician to assist with identification of specific Stressors or current issues interfering with healing process. Setting desired goal for each stressor or current issue identified.;Long Term Goal: Stressors or current issues are controlled or eliminated.;Short Term goal: Identification and review with participant of any Quality of Life or Depression concerns found by scoring the questionnaire.;Long Term goal: The participant improves quality of Life and PHQ9 Scores as seen by post scores and/or verbalization of changes       Quality of Life Scores:  Quality of Life - 05/12/18 1217      Quality of Life   Select  Quality of Life      Quality of Life Scores   Health/Function Pre  18.53 %    Socioeconomic Pre  26.5 %    Psych/Spiritual Pre  28.58 %    Family Pre  24.4 %    GLOBAL Pre  22.83 %      Scores of 19 and below usually indicate a poorer quality of life in these areas.  A difference of  2-3 points is a clinically meaningful difference.  A difference of 2-3 points in the total score of the Quality of Life Index has been associated with significant improvement in overall quality of life, self-image, physical symptoms, and general health in studies assessing change in quality of life.  PHQ-9: Recent Review Flowsheet Data    Depression screen [facility] 2/9 05/12/2018 05/06/2018 03/17/2018 02/25/2018 01/17/2018   Decreased Interest - 0 0 0 0   Down, Depressed, Hopeless -  0 0 0 0   PHQ - 2 Score - 0 0 0 0   Altered sleeping 1 - - 0 -    Tired, decreased energy 2 - - 0 -   Change in appetite 0 - - 0 -   Feeling bad or failure about yourself  0 - - 0 -   Trouble concentrating 0 - - 0 -   Moving slowly or fidgety/restless 0 - - 0 -   Suicidal thoughts 0 - - 0 -   PHQ-9 Score - - - 0 -   Difficult doing work/chores Not difficult at all - - Not difficult at all -     Interpretation of Total Score  Total Score Depression Severity:  1-4 = Minimal depression, 5-9 = Mild depression, 10-14 = Moderate depression, 15-19 = Moderately severe depression, 20-27 = Severe depression   Psychosocial Evaluation and Intervention:   Psychosocial Re-Evaluation:   Psychosocial Discharge (Final Psychosocial Re-Evaluation):   Vocational Rehabilitation: Provide vocational rehab assistance to qualifying candidates.   Vocational Rehab Evaluation & Intervention: Vocational Rehab - 05/12/18 1222      Initial Vocational Rehab Evaluation & Intervention   Assessment shows need for Vocational Rehabilitation  No       Education: Education Goals: Education classes will be provided on a variety of topics geared toward better understanding of heart health and risk factor modification. Participant will state understanding/return demonstration of topics presented as noted by education test scores.  Learning Barriers/Preferences: Learning Barriers/Preferences - 05/12/18 1221      Learning Barriers/Preferences   Learning Barriers  None    Learning Preferences  None       Education Topics:  AED/CPR: - Group verbal and written instruction with the use of models to demonstrate the basic use of the AED with the basic ABC's of resuscitation.   General Nutrition Guidelines/Fats and Fiber: -Group instruction provided by verbal, written material, models and posters to present the general guidelines for heart healthy nutrition. Gives an explanation and review of dietary fats and fiber.   Controlling Sodium/Reading Food Labels: -Group verbal and  written material supporting the discussion of sodium use in heart healthy nutrition. Review and explanation with models, verbal and written materials for utilization of the food label.   Exercise Physiology & General Exercise Guidelines: - Group verbal and written instruction with models to review the exercise physiology of the cardiovascular system and associated critical values. Provides general exercise guidelines with specific guidelines to those with heart or lung disease.    Aerobic Exercise & Resistance Training: - Gives group verbal and written instruction on the various components of exercise. Focuses on aerobic and resistive training programs and the benefits of this training and how to safely progress through these programs..   Flexibility, Balance, Mind/Body Relaxation: Provides group verbal/written instruction on the benefits of flexibility and balance training, including mind/body exercise modes such as yoga, pilates and tai chi.  Demonstration and skill practice provided.   Stress and Anxiety: - Provides group verbal and written instruction about the health risks of elevated stress and causes of high stress.  Discuss the correlation between heart/lung disease and anxiety and treatment options. Review healthy ways to manage with stress and anxiety.   Depression: - Provides group verbal and written instruction on the correlation between heart/lung disease and depressed mood, treatment options, and the stigmas associated with seeking treatment.   Anatomy & Physiology of the Heart: - Group verbal and written instruction and models provide basic  cardiac anatomy and physiology, with the coronary electrical and arterial systems. Review of Valvular disease and Heart Failure   Cardiac Procedures: - Group verbal and written instruction to review commonly prescribed medications for heart disease. Reviews the medication, class of the drug, and side effects. Includes the steps to  properly store meds and maintain the prescription regimen. (beta blockers and nitrates)   Cardiac Medications I: - Group verbal and written instruction to review commonly prescribed medications for heart disease. Reviews the medication, class of the drug, and side effects. Includes the steps to properly store meds and maintain the prescription regimen.   Cardiac Medications II: -Group verbal and written instruction to review commonly prescribed medications for heart disease. Reviews the medication, class of the drug, and side effects. (all other drug classes)    Go Sex-Intimacy & Heart Disease, Get SMART - Goal Setting: - Group verbal and written instruction through game format to discuss heart disease and the return to sexual intimacy. Provides group verbal and written material to discuss and apply goal setting through the application of the S.M.A.R.T. Method.   Other Matters of the Heart: - Provides group verbal, written materials and models to describe Stable Angina and Peripheral Artery. Includes description of the disease process and treatment options available to the cardiac patient.   Exercise & Equipment Safety: - Individual verbal instruction and demonstration of equipment use and safety with use of the equipment.   Cardiac Rehab from 05/12/2018 in [facility] Cardiac and Pulmonary Rehab  Date  05/12/18  Educator  SB  Instruction Review Code  1- Verbalizes Understanding      Infection Prevention: - Provides verbal and written material to individual with discussion of infection control including proper hand washing and proper equipment cleaning during exercise session.   Cardiac Rehab from 05/12/2018 in [facility] Cardiac and Pulmonary Rehab  Date  05/12/18  Educator  Sb  Instruction Review Code  1- Verbalizes Understanding      Falls Prevention: - Provides verbal and written material to individual with discussion of falls prevention and safety.   Cardiac Rehab from 05/12/2018 in [facility]  Cardiac and Pulmonary Rehab  Date  05/12/18  Educator  SB  Instruction Review Code  1- Verbalizes Understanding      Diabetes: - Individual verbal and written instruction to review signs/symptoms of diabetes, desired ranges of glucose level fasting, after meals and with exercise. Acknowledge that pre and post exercise glucose checks will be done for 3 sessions at entry of program.   Cardiac Rehab from 05/12/2018 in [facility] Cardiac and Pulmonary Rehab  Date  05/12/18  Educator  SB  Instruction Review Code  1- Verbalizes Understanding      Know Your Numbers and Risk Factors: -Group verbal and written instruction about important numbers in your health.  Discussion of what are risk factors and how they play a role in the disease process.  Review of Cholesterol, Blood Pressure, Diabetes, and BMI and the role they play in your overall health.   Sleep Hygiene: -Provides group verbal and written instruction about how sleep can affect your health.  Define sleep hygiene, discuss sleep cycles and impact of sleep habits. Review good sleep hygiene tips.    Other: -Provides group and verbal instruction on various topics (see comments)   Knowledge Questionnaire Score: Knowledge Questionnaire Score - 05/12/18 1221      Knowledge Questionnaire Score   Pre Score  25/26       Core Components/Risk Factors/Patient Goals at Admission: Personal  Goals and Risk Factors at Admission - 05/12/18 1222      Core Components/Risk Factors/Patient Goals on Admission    Weight Management  Obesity;Weight Loss;Yes    Intervention  Weight Management: Develop a combined nutrition and exercise program designed to reach desired caloric intake, while maintaining appropriate intake of nutrient and fiber, sodium and fats, and appropriate energy expenditure required for the weight goal.    Admit Weight  232 lb 3.2 oz (105.3 kg)    Goal Weight: Short Term  230 lb (104.3 kg)    Goal Weight: Long Term  190 lb (86.2 kg)     Expected Outcomes  Short Term: Continue to assess and modify interventions until short term weight is achieved;Long Term: Adherence to nutrition and physical activity/exercise program aimed toward attainment of established weight goal;Weight Loss: Understanding of general recommendations for a balanced deficit meal plan, which promotes 1-2 lb weight loss per week and includes a negative energy balance of [phone] kcal/d    Diabetes  Yes    Intervention  Provide education about signs/symptoms and action to take for hypo/hyperglycemia.;Provide education about proper nutrition, including hydration, and aerobic/resistive exercise prescription along with prescribed medications to achieve blood glucose in normal ranges: Fasting glucose 65-99 mg/dL    Expected Outcomes  Short Term: Participant verbalizes understanding of the signs/symptoms and immediate care of hyper/hypoglycemia, proper foot care and importance of medication, aerobic/resistive exercise and nutrition plan for blood glucose control.;Long Term: Attainment of HbA1C < 7%.    Heart Failure  Yes    Intervention  Provide a combined exercise and nutrition program that is supplemented with education, support and counseling about heart failure. Directed toward relieving symptoms such as shortness of breath, decreased exercise tolerance, and extremity edema.    Expected Outcomes  Improve functional capacity of life;Short term: Attendance in program 2-3 days a week with increased exercise capacity. Reported lower sodium intake. Reported increased fruit and vegetable intake. Reports medication compliance.;Short term: Daily weights obtained and reported for increase. Utilizing diuretic protocols set by physician.;Long term: Adoption of self-care skills and reduction of barriers for early signs and symptoms recognition and intervention leading to self-care maintenance.    Hypertension  Yes    Intervention  Provide education on lifestyle modifcations including  regular physical activity/exercise, weight management, moderate sodium restriction and increased consumption of fresh fruit, vegetables, and low fat dairy, alcohol moderation, and smoking cessation.;Monitor prescription use compliance.    Expected Outcomes  Short Term: Continued assessment and intervention until BP is < 140/36m HG in hypertensive participants. < 130/88mHG in hypertensive participants with diabetes, heart failure or chronic kidney disease.;Long Term: Maintenance of blood pressure at goal levels.    Lipids  Yes    Intervention  Provide education and support for participant on nutrition & aerobic/resistive exercise along with prescribed medications to achieve LDL <7053mHDL >96m39m  Expected Outcomes  Short Term: Participant states understanding of desired cholesterol values and is compliant with medications prescribed. Participant is following exercise prescription and nutrition guidelines.;Long Term: Cholesterol controlled with medications as prescribed, with individualized exercise RX and with personalized nutrition plan. Value goals: LDL < 70mg76mL > 40 mg.       Core Components/Risk Factors/Patient Goals Review:    Core Components/Risk Factors/Patient Goals at Discharge (Final Review):    ITP Comments: ITP Comments    Row Name 05/12/18 1225           ITP Comments  Medical review completed today. INitial ITP  sent to Dr Caryn Section to review, change as needed and to sign. Diagnosis documentation can be found in [facility] 04/25/2018 Encounter          Comments: Medical review completed

## 2018-05-12 NOTE — Progress Notes (Signed)
Daily Session Note  Patient Details  Name: [name] MRN: [id number] Date of Birth: [birth year] Referring Provider:     Cardiac Rehab from 05/12/2018 in [facility]  Referring Provider  [name]      Encounter Date: 05/12/2018  Check In: Session Check In - 05/12/18 1206      Check-In   Supervising physician immediately available to respond to emergencies  See telemetry face sheet for immediately available ER MD    Location  [facility]-Cardiac & Pulmonary Rehab    Staff Present  Heath Lark, RN, BSN, CCRP;Amanda Sommer, BA, ACSM CEP, Exercise Physiologist    Warm-up and Cool-down  Not performed (comment)    VAD Patient?  No    PAD/SET Patient?  No      Pain Assessment   Currently in Pain?  No/denies          Social History   Tobacco Use  Smoking Status Never Smoker  Smokeless Tobacco Never Used  Tobacco Comment   smoking cessation materials not required    Goals Met:  Exercise tolerated well Personal goals reviewed No report of cardiac concerns or symptoms  Goals Unmet:  Not Applicable  Comments: medical review completed   Dr. Emily Perry is Medical Director for [name].

## 2018-05-12 NOTE — Patient Instructions (Signed)
Patient Instructions  Patient Details  Name: [name] MRN: [id number] Date of Birth: 1961/05/23 Referring Provider:  [name], MD  Below are your personal goals for exercise, nutrition, and risk factors. Our goal is to help you stay on track towards obtaining and maintaining these goals. We will be discussing your progress on these goals with you throughout the program.  Initial Exercise Prescription: Initial Exercise Prescription - 05/12/18 1300      Date of Initial Exercise RX and Referring Provider   Date  05/12/18    Referring Provider  Arida      Treadmill   MPH  2.3    Grade  1    Minutes  15    METs  3.08      Recumbant Bike   Level  5    RPM  60    Watts  35    Minutes  15    METs  3      T5 Nustep   Level  2    SPM  80    Minutes  15    METs  3      Prescription Details   Frequency (times per week)  3    Duration  Progress to 30 minutes of continuous aerobic without signs/symptoms of physical distress      Intensity   THRR 40-80% of Max Heartrate  123-149    Ratings of Perceived Exertion  11-13    Perceived Dyspnea  0-4      Resistance Training   Training Prescription  Yes    Weight  4 lb    Reps  10-15       Exercise Goals: Frequency: Be able to perform aerobic exercise two to three times per week in program working toward 2-5 days per week of home exercise.  Intensity: Work with a perceived exertion of 11 (fairly light) - 15 (hard) while following your exercise prescription.  We will make changes to your prescription with you as you progress through the program.   Duration: Be able to do 30 to 45 minutes of continuous aerobic exercise in addition to a 5 minute warm-up and a 5 minute cool-down routine.   Nutrition Goals: Your personal nutrition goals will be established when you do your nutrition analysis with the dietician.  The following are general nutrition guidelines to follow: Cholesterol < 200mg /day Sodium <  1500mg /day Fiber: Men over 50 yrs - 30 grams per day  Personal Goals: Personal Goals and Risk Factors at Admission - 05/12/18 1222      Core Components/Risk Factors/Patient Goals on Admission    Weight Management  Obesity;Weight Loss;Yes    Intervention  Weight Management: Develop a combined nutrition and exercise program designed to reach desired caloric intake, while maintaining appropriate intake of nutrient and fiber, sodium and fats, and appropriate energy expenditure required for the weight goal.    Admit Weight  232 lb 3.2 oz (105.3 kg)    Goal Weight: Short Term  230 lb (104.3 kg)    Goal Weight: Long Term  190 lb (86.2 kg)    Expected Outcomes  Short Term: Continue to assess and modify interventions until short term weight is achieved;Long Term: Adherence to nutrition and physical activity/exercise program aimed toward attainment of established weight goal;Weight Loss: Understanding of general recommendations for a balanced deficit meal plan, which promotes 1-2 lb weight loss per week and includes a negative energy balance of [id number] kcal/d    Diabetes  Yes    Intervention  Provide education about signs/symptoms and action to take for hypo/hyperglycemia.;Provide education about proper nutrition, including hydration, and aerobic/resistive exercise prescription along with prescribed medications to achieve blood glucose in normal ranges: Fasting glucose 65-99 mg/dL    Expected Outcomes  Short Term: Participant verbalizes understanding of the signs/symptoms and immediate care of hyper/hypoglycemia, proper foot care and importance of medication, aerobic/resistive exercise and nutrition plan for blood glucose control.;Long Term: Attainment of HbA1C < 7%.    Heart Failure  Yes    Intervention  Provide a combined exercise and nutrition program that is supplemented with education, support and counseling about heart failure. Directed toward relieving symptoms such as shortness of breath, decreased  exercise tolerance, and extremity edema.    Expected Outcomes  Improve functional capacity of life;Short term: Attendance in program 2-3 days a week with increased exercise capacity. Reported lower sodium intake. Reported increased fruit and vegetable intake. Reports medication compliance.;Short term: Daily weights obtained and reported for increase. Utilizing diuretic protocols set by physician.;Long term: Adoption of self-care skills and reduction of barriers for early signs and symptoms recognition and intervention leading to self-care maintenance.    Hypertension  Yes    Intervention  Provide education on lifestyle modifcations including regular physical activity/exercise, weight management, moderate sodium restriction and increased consumption of fresh fruit, vegetables, and low fat dairy, alcohol moderation, and smoking cessation.;Monitor prescription use compliance.    Expected Outcomes  Short Term: Continued assessment and intervention until BP is < 140/50mm HG in hypertensive participants. < 130/44mm HG in hypertensive participants with diabetes, heart failure or chronic kidney disease.;Long Term: Maintenance of blood pressure at goal levels.    Lipids  Yes    Intervention  Provide education and support for participant on nutrition & aerobic/resistive exercise along with prescribed medications to achieve LDL 70mg , HDL >40mg .    Expected Outcomes  Short Term: Participant states understanding of desired cholesterol values and is compliant with medications prescribed. Participant is following exercise prescription and nutrition guidelines.;Long Term: Cholesterol controlled with medications as prescribed, with individualized exercise RX and with personalized nutrition plan. Value goals: LDL < 70mg , HDL > 40 mg.       Tobacco Use Initial Evaluation: Social History   Tobacco Use  Smoking Status Never Smoker  Smokeless Tobacco Never Used  Tobacco Comment   smoking cessation materials not  required    Exercise Goals and Review: Exercise Goals    Row Name 05/12/18 1352             Exercise Goals   Increase Physical Activity  Yes       Intervention  Provide advice, education, support and counseling about physical activity/exercise needs.;Develop an individualized exercise prescription for aerobic and resistive training based on initial evaluation findings, risk stratification, comorbidities and participant's personal goals.       Expected Outcomes  Short Term: Attend rehab on a regular basis to increase amount of physical activity.;Long Term: Add in home exercise to make exercise part of routine and to increase amount of physical activity.;Long Term: Exercising regularly at least 3-5 days a week.       Increase Strength and Stamina  Yes       Intervention  Provide advice, education, support and counseling about physical activity/exercise needs.;Develop an individualized exercise prescription for aerobic and resistive training based on initial evaluation findings, risk stratification, comorbidities and participant's personal goals.       Expected Outcomes  Short Term: Increase workloads from  initial exercise prescription for resistance, speed, and METs.;Short Term: Perform resistance training exercises routinely during rehab and add in resistance training at home;Long Term: Improve cardiorespiratory fitness, muscular endurance and strength as measured by increased METs and functional capacity (6MWT)       Able to understand and use rate of perceived exertion (RPE) scale  Yes       Intervention  Provide education and explanation on how to use RPE scale       Expected Outcomes  Short Term: Able to use RPE daily in rehab to express subjective intensity level;Long Term:  Able to use RPE to guide intensity level when exercising independently       Able to understand and use Dyspnea scale  Yes       Intervention  Provide education and explanation on how to use Dyspnea scale        Expected Outcomes  Short Term: Able to use Dyspnea scale daily in rehab to express subjective sense of shortness of breath during exertion;Long Term: Able to use Dyspnea scale to guide intensity level when exercising independently       Knowledge and understanding of Target Heart Rate Range (THRR)  Yes       Intervention  Provide education and explanation of THRR including how the numbers were predicted and where they are located for reference       Expected Outcomes  Short Term: Able to state/look up THRR;Long Term: Able to use THRR to govern intensity when exercising independently;Short Term: Able to use daily as guideline for intensity in rehab       Able to check pulse independently  Yes       Intervention  Provide education and demonstration on how to check pulse in carotid and radial arteries.;Review the importance of being able to check your own pulse for safety during independent exercise       Expected Outcomes  Short Term: Able to explain why pulse checking is important during independent exercise;Long Term: Able to check pulse independently and accurately       Understanding of Exercise Prescription  Yes       Intervention  Provide education, explanation, and written materials on patient's individual exercise prescription       Expected Outcomes  Short Term: Able to explain program exercise prescription;Long Term: Able to explain home exercise prescription to exercise independently          Copy of goals given to participant.

## 2018-05-12 NOTE — Patient Instructions (Signed)
Medication Instructions: INCREASE the Carvedilol to 6.25 mg twice daily  If you need a refill on your cardiac medications before your next appointment, please call your pharmacy.   Follow-Up: Your physician wants you to follow-up in 2 months with Dr. Fletcher Anon.   Thank you for choosing [facility] at [facility]!

## 2018-05-12 NOTE — Progress Notes (Signed)
Cardiology Office Note   Date:  05/12/2018   ID:  [name], DOB [birth year], MRN [id number]  PCP:  [name], MD  Cardiologist:   [name], MD   Chief Complaint  Patient presents with  . other    f/u cardiac cath medications reviewed verbally.slight discomfort nothing new.      History of Present Illness: [name] is a 57 y.o. male who presents for a follow-up visit regarding coronary artery disease and chronic systolic heart failure.  He has extensive cardiac history. He had a total of 10 stents placed (in LAD and RCA) starting in 2009 after he presented with myocardial infarction.  He has other chronic medical conditions that include diabetes, hypertension, hyperlipidemia,myasthenia gravis,  generalized pain due to reported Lyme disease and obesity.  Echocardiogram in January 2017 Showed normal LV systolic function and wall motion with no evidence of pulmonary hypertension. (However, in retrospect after reviewing the study which was very technically difficult, EF was likely in the range of 40 to 45% with severe anterior and apical hypokinesis) He has history of hyperlipidemia with intolerance to high-dose statins.  Most recent nuclear stress test in May 2018 showed evidence of prior mid to distal anterior and apical infarct without significant ischemia. EF was mildly reduced at 45-54%.  He had worsening dyspnea over the last 6 months.  He underwent an echocardiogram in June which showed worsening LV systolic function with an EF of 30% with akinesis of the anterior, anterolateral and apical myocardium.  There was mild mitral regurgitation.  Due to worsening cardiomyopathy and symptoms, I proceeded with left heart catheterization in July which showed patent stents in the LAD, OM's and right coronary artery.  There was a new mid left circumflex stenosis which was stented.  EF was 25 to 30%.  LVEDP was severely elevated.  The patient was started on furosemide.   Entresto was added. Renal function worsened with higher dose of furosemide but improved after decreasing the dose to 20 mg once daily.  He has been doing reasonably well and reports improvement in shortness of breath.  No chest pain.  He has been taking his medications regularly. He is going to start cardiac rehab now.  Past Medical History:  Diagnosis Date  . Allergy    dust, seasonal (worse in the fall).  . 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 Followed by Pain Specialist in [place], back and neck  . Asthma    BRONCHITIS  . Cataract    First Dx in 2012  . CHF (congestive heart failure) ([place])   . Coronary artery disease    a. Prior Ant MI->s/p multiple stents placed in the LAD and right coronary artery ([place]); b. 2016 Cath: reportedly nonobs dzs;  c. 02/2017 MV: EF 45-54%, ap/ant, ap/inf, apical infarct, no ishcemia; c. 04/2018 Cath/PCI: LM nl, LAD 20p, patent mid stent, LCX 70m3.25x15 [place] DES), OM1 nl, OM2 50, OM3 40 w/ patent stent, RCA 40p, 25m40d w/ patent stent in RPDA, RPAV 60, EF 25-35%. 2+MR.  . Diabetes mellitus without complication ([facility]   TYPE 2  . Diabetic peripheral neuropathy ([facility])    feet and hands  . GERD (gastroesophageal reflux disease)   . Headache    muscle tension  . Hyperlipidemia   . Hypertension    CONTROLLED ON MEDS  . Insomnia   . Ischemic cardiomyopathy    a. 03/2018 Echo: EF 30-35%, ant, antlat, apical AK, Gr1 DD, mild MR, mildly  dil LA.  [name] Lyme disease    Chronic  . Myasthenia gravis ([place])   . Myocardial infarction ([place]) 2010  . Seasonal allergies   . Sleep apnea    CPAP    Past Surgical History:  Procedure Laterality Date  . BILATERAL CARPAL TUNNEL RELEASE Bilateral L in 2012 and R in 2013  . CARDIAC CATHETERIZATION     Several Caths, most recent in  March 2016.  [name] COLONOSCOPY WITH PROPOFOL N/A 01/10/2016   Procedure: COLONOSCOPY WITH PROPOFOL;  Surgeon: [name], MD;  Location: [facility];  Service: Endoscopy;  Laterality: N/A;  .  CORONARY ANGIOPLASTY    . CORONARY STENT INTERVENTION N/A 04/25/2018   Procedure: CORONARY STENT INTERVENTION;  Surgeon: [name], MD;  Location: [facility];  Service: Cardiovascular;  Laterality: N/A;  . ESOPHAGOGASTRODUODENOSCOPY (EGD) WITH PROPOFOL N/A 01/10/2016   Procedure: ESOPHAGOGASTRODUODENOSCOPY (EGD) WITH PROPOFOL;  Surgeon: [name], MD;  Location: [facility];  Service: Endoscopy;  Laterality: N/A;  . EYE SURGERY Bilateral 2012   cataract/bilateral vitrectomies  . LEFT HEART CATH AND CORONARY ANGIOGRAPHY Left 04/25/2018   Procedure: LEFT HEART CATH AND CORONARY ANGIOGRAPHY;  Surgeon: [name], MD;  Location: [facility];  Service: Cardiovascular;  Laterality: Left;  . TONSILLECTOMY AND ADENOIDECTOMY     As a child  . TUNNELED VENOUS CATHETER PLACEMENT     removed     Current Outpatient Medications  Medication Sig Dispense Refill  . albuterol (PROVENTIL HFA;VENTOLIN HFA) 108 (90 Base) MCG/ACT inhaler Inhale 2 puffs into the lungs every 6 (six) hours as needed for wheezing or shortness of breath.    [name] albuterol (PROVENTIL) (2.5 MG/3ML) 0.083% nebulizer solution Take 2.5 mg by nebulization every 6 (six) hours as needed for wheezing or shortness of breath.    [name] aspirin EC 81 MG tablet Take 1 tablet (81 mg total) by mouth daily. 90 tablet 3  . BREO ELLIPTA 200-25 MCG/INH AEPB INHALE 1 PUFF INTO THE LUNGS DAILY 1 each 0  . carvedilol (COREG) 3.125 MG tablet TAKE 1 TABLET BY MOUTH TWICE DAILY 180 tablet 3  . clopidogrel (PLAVIX) 75 MG tablet Take 1 tablet (75 mg total) by mouth daily with breakfast. 30 tablet 6  . cyanocobalamin 1000 MCG tablet Take 1,000 mcg by mouth daily. am    . cyclobenzaprine (FLEXERIL) 10 MG tablet Take 1 tablet (10 mg total) by mouth 2 (two) times daily. 60 tablet 5  . dexlansoprazole (DEXILANT) 60 MG capsule Take 1 capsule (60 mg total) by mouth daily. 30 capsule 6  . ezetimibe (ZETIA) 10 MG tablet Take 1 tablet (10 mg  total) by mouth daily. 90 tablet 3  . furosemide (LASIX) 40 MG tablet Take 0.5 tablets (20 mg total) by mouth daily. 30 tablet 6  . gabapentin (NEURONTIN) 300 MG capsule Take 1 capsule (300 mg total) by mouth 3 (three) times daily. 90 capsule 1  . gabapentin (NEURONTIN) 800 MG tablet Take 1 tablet (800 mg total) by mouth at bedtime. 90 tablet 0  . insulin lispro (HUMALOG KWIKPEN) 100 UNIT/ML KiwkPen Inject 7-8 Units into the skin 3 (three) times daily. Reported on 12/02/2015/ sliding scale 1 unit for every 8 units of carbs; and 1 unit for every 20 above 120    . Insulin Pen Needle (FIFTY50 PEN NEEDLES) 32G X 4 MM MISC Inject 1 each into the skin as needed.    [name] LANTUS SOLOSTAR 100 UNIT/ML Solostar Pen Inject 33-35 Units into  the skin daily at 10 pm.   0  . levocetirizine (XYZAL) 5 MG tablet Take 1 tablet (5 mg total) by mouth every evening. 30 tablet 3  . liraglutide (VICTOZA) 18 MG/3ML SOPN 1.8 units every morning    . loratadine (CLARITIN) 10 MG tablet Take 10 mg by mouth daily. am    . metFORMIN (GLUCOPHAGE) 1000 MG tablet Take 1,000 mg by mouth 2 (two) times daily with a meal.    . montelukast (SINGULAIR) 10 MG tablet Take 10 mg by mouth at bedtime.   1  . naloxone (NARCAN) nasal spray 4 mg/0.1 mL For excess sedation from opioids 1 kit 2  . nitroGLYCERIN (NITROSTAT) 0.3 MG SL tablet Place 1 tablet (0.3 mg total) under the tongue every 5 (five) minutes as needed for chest pain. Reported on 12/02/2015 25 tablet 3  . Oxycodone HCl 10 MG TABS Take 1 tablet (10 mg total) by mouth 4 (four) times daily as needed. 120 tablet 0  . pyridostigmine (MESTINON) 60 MG tablet Take 60 mg by mouth 4 (four) times daily.    . rosuvastatin (CRESTOR) 5 MG tablet Take 5 mg by mouth daily.  0  . sacubitril-valsartan (ENTRESTO) 24-26 MG Take 1 tablet by mouth 2 (two) times daily. 60 tablet 6  . triamcinolone cream (KENALOG) 0.1 % apply to affected area twice a day UNTIL CLEAR as needed  0   No current  facility-administered medications for this visit.     Allergies:   Metoprolol and Novolog [insulin aspart]    Social History:  The patient  reports that he has never smoked. He has never used smokeless tobacco. He reports that he drank alcohol. He reports that he does not use drugs.   Family History:  The patient's family history includes Cancer in his father; Diabetes in his brother and mother; Healthy in his brother and brother; Heart disease in his mother.    ROS:  Please see the history of present illness.   Otherwise, review of systems are positive for none.   All other systems are reviewed and negative.    PHYSICAL EXAM: VS:  BP 118/64 (BP Location: Left Arm, Patient Position: Sitting, Cuff Size: Normal)   Ht '5\' 7"'$  (1.702 m)   Wt 232 lb 12.8 oz (105.6 kg)   BMI 36.46 kg/m  , BMI Body mass index is 36.46 kg/m. GEN: Well nourished, well developed, in no acute distress  HEENT: normal  Neck: no JVD, carotid bruits, or masses Cardiac: RRR; no murmurs, rubs, or gallops,no edema  Respiratory:  clear to auscultation bilaterally, normal work of breathing GI: soft, nontender, nondistended, + BS MS: no deformity or atrophy  Skin: warm and dry, no rash Neuro:  Strength and sensation are intact Psych: euthymic mood, full affect   EKG:  EKG is ordered today. The ekg ordered today demonstrates normal sinus rhythm with anterolateral infarct. Possible old inferior infarct.   Recent Labs: 11/25/2017: TSH 4.130 01/17/2018: ALT 27 04/26/2018: Hemoglobin 12.3; Platelets 182 05/06/2018: BUN 31; Creatinine, Ser 1.25; Potassium 4.8; Sodium 141    Lipid Panel    Component Value Date/Time   CHOL 113 01/17/2018 0819   TRIG 92 01/17/2018 0819   HDL 33 (L) 01/17/2018 0819   CHOLHDL 3.4 01/17/2018 0819   CHOLHDL 3.7 11/29/2017 1154   VLDL 26 11/29/2017 1154   LDLCALC 62 01/17/2018 0819      Wt Readings from Last 3 Encounters:  05/12/18 232 lb 12.8 oz (105.6 kg)  05/09/18  226 lb (102.5  kg)  05/06/18 234 lb (106.1 kg)        ASSESSMENT AND PLAN:  1.  Coronary artery disease involving native coronary arteries with other forms of angina:    Status post recent PCI and drug-eluting stent placement to the mid left circumflex.  The patient now has 11 cardiac stents.  I recommend continuing indefinite dual antiplatelet therapy.  However, for procedures, Plavix can be held after 6 months for 5 to 7 days if needed.  The patient is going to start cardiac rehab.  2.  Chronic systolic heart failure with severely reduced LV systolic function due to ischemic cardiomyopathy: Recent worsening of ejection fraction.  He appears to be euvolemic on small dose furosemide.  Continue treatment with Entresto.  I elected to increase carvedilol to 6.25 mg twice daily.  The next step would be to add spironolactone as long as blood pressure and renal function tolerate.  3. Essential hypertension:   Blood pressure is controlled.  4. Hyperlipidemia: Unfortunately, he is only able to tolerate small dose rosuvastatin 2.5 mg once daily.  LDL improved with the addition of Zetia and most recently was 62.  5.  Obesity: I discussed with him the importance of healthy lifestyle changes.  Disposition:   FU with me in 2 months  Signed,  [name], MD  05/12/2018 10:31 AM    [place]

## 2018-05-16 DIAGNOSIS — Z794 Long term (current) use of insulin: Secondary | ICD-10-CM | POA: Diagnosis not present

## 2018-05-16 DIAGNOSIS — E1165 Type 2 diabetes mellitus with hyperglycemia: Secondary | ICD-10-CM | POA: Diagnosis not present

## 2018-05-16 DIAGNOSIS — E113593 Type 2 diabetes mellitus with proliferative diabetic retinopathy without macular edema, bilateral: Secondary | ICD-10-CM | POA: Diagnosis not present

## 2018-05-16 LAB — HM DIABETES EYE EXAM

## 2018-05-18 ENCOUNTER — Encounter: Payer: Self-pay | Admitting: Family Medicine

## 2018-05-18 ENCOUNTER — Encounter: Payer: Self-pay | Admitting: *Deleted

## 2018-05-18 DIAGNOSIS — E785 Hyperlipidemia, unspecified: Secondary | ICD-10-CM | POA: Diagnosis not present

## 2018-05-18 DIAGNOSIS — Z794 Long term (current) use of insulin: Secondary | ICD-10-CM | POA: Diagnosis not present

## 2018-05-18 DIAGNOSIS — E1169 Type 2 diabetes mellitus with other specified complication: Secondary | ICD-10-CM | POA: Diagnosis not present

## 2018-05-18 DIAGNOSIS — E559 Vitamin D deficiency, unspecified: Secondary | ICD-10-CM | POA: Diagnosis not present

## 2018-05-18 DIAGNOSIS — Z955 Presence of coronary angioplasty implant and graft: Secondary | ICD-10-CM

## 2018-05-18 DIAGNOSIS — E1142 Type 2 diabetes mellitus with diabetic polyneuropathy: Secondary | ICD-10-CM | POA: Diagnosis not present

## 2018-05-18 LAB — MICROALBUMIN, URINE
MICROALBUMIN (UR) POC: 204 mg/L
Microalb, Ur: 76.1

## 2018-05-18 LAB — LIPID PANEL
Cholesterol: 109 (ref 0–200)
HDL: 37 (ref 35–70)
LDL CALC: 50
LDl/HDL Ratio: 2.9
Triglycerides: 112 (ref 40–160)

## 2018-05-18 LAB — HEMOGLOBIN A1C: Hemoglobin A1C: 7.9 % — AB (ref 4.0–5.6)

## 2018-05-18 NOTE — Progress Notes (Signed)
Cardiac Individual Treatment Plan  Patient Details  Name: [name] MRN: [id number] Date of Birth: 1960/11/21 Referring Provider:     Cardiac Rehab from 05/12/2018 in [facility] Cardiac and Pulmonary Rehab  Referring Provider  Arida      Initial Encounter Date:    Cardiac Rehab from 05/12/2018 in [facility] Cardiac and Pulmonary Rehab  Date  05/12/18      Visit Diagnosis: Status post coronary artery stent placement  Patient's Home Medications on Admission:  Current Outpatient Medications:  .  albuterol (PROVENTIL HFA;VENTOLIN HFA) 108 (90 Base) MCG/ACT inhaler, Inhale 2 puffs into the lungs every 6 (six) hours as needed for wheezing or shortness of breath., Disp: , Rfl:  .  albuterol (PROVENTIL) (2.5 MG/3ML) 0.083% nebulizer solution, Take 2.5 mg by nebulization every 6 (six) hours as needed for wheezing or shortness of breath., Disp: , Rfl:  .  aspirin EC 81 MG tablet, Take 1 tablet (81 mg total) by mouth daily., Disp: 90 tablet, Rfl: 3 .  BREO ELLIPTA 200-25 MCG/INH AEPB, INHALE 1 PUFF INTO THE LUNGS DAILY, Disp: 1 each, Rfl: 0 .  carvedilol (COREG) 6.25 MG tablet, Take 1 tablet (6.25 mg total) by mouth 2 (two) times daily., Disp: 180 tablet, Rfl: 3 .  clopidogrel (PLAVIX) 75 MG tablet, Take 1 tablet (75 mg total) by mouth daily with breakfast., Disp: 30 tablet, Rfl: 6 .  cyanocobalamin 1000 MCG tablet, Take 1,000 mcg by mouth daily. am, Disp: , Rfl:  .  cyclobenzaprine (FLEXERIL) 10 MG tablet, Take 1 tablet (10 mg total) by mouth 2 (two) times daily., Disp: 60 tablet, Rfl: 5 .  dexlansoprazole (DEXILANT) 60 MG capsule, Take 1 capsule (60 mg total) by mouth daily., Disp: 30 capsule, Rfl: 6 .  ezetimibe (ZETIA) 10 MG tablet, Take 1 tablet (10 mg total) by mouth daily., Disp: 90 tablet, Rfl: 3 .  furosemide (LASIX) 40 MG tablet, Take 0.5 tablets (20 mg total) by mouth daily., Disp: 30 tablet, Rfl: 6 .  gabapentin (NEURONTIN) 300 MG capsule, Take 1 capsule (300 mg total) by mouth 3 (three)  times daily., Disp: 90 capsule, Rfl: 1 .  gabapentin (NEURONTIN) 800 MG tablet, Take 1 tablet (800 mg total) by mouth at bedtime., Disp: 90 tablet, Rfl: 0 .  insulin lispro (HUMALOG KWIKPEN) 100 UNIT/ML KiwkPen, Inject 7-8 Units into the skin 3 (three) times daily. Reported on 12/02/2015/ sliding scale 1 unit for every 8 units of carbs; and 1 unit for every 20 above 120, Disp: , Rfl:  .  Insulin Pen Needle (FIFTY50 PEN NEEDLES) 32G X 4 MM MISC, Inject 1 each into the skin as needed., Disp: , Rfl:  .  LANTUS SOLOSTAR 100 UNIT/ML Solostar Pen, Inject 33-35 Units into the skin daily at 10 pm. , Disp: , Rfl: 0 .  levocetirizine (XYZAL) 5 MG tablet, Take 1 tablet (5 mg total) by mouth every evening., Disp: 30 tablet, Rfl: 3 .  liraglutide (VICTOZA) 18 MG/3ML SOPN, 1.8 units every morning, Disp: , Rfl:  .  loratadine (CLARITIN) 10 MG tablet, Take 10 mg by mouth daily. am, Disp: , Rfl:  .  metFORMIN (GLUCOPHAGE) 1000 MG tablet, Take 1,000 mg by mouth 2 (two) times daily with a meal., Disp: , Rfl:  .  montelukast (SINGULAIR) 10 MG tablet, Take 10 mg by mouth at bedtime. , Disp: , Rfl: 1 .  naloxone (NARCAN) nasal spray 4 mg/0.1 mL, For excess sedation from opioids, Disp: 1 kit, Rfl: 2 .  nitroGLYCERIN (  NITROSTAT) 0.3 MG SL tablet, Place 1 tablet (0.3 mg total) under the tongue every 5 (five) minutes as needed for chest pain. Reported on 12/02/2015, Disp: 25 tablet, Rfl: 3 .  Oxycodone HCl 10 MG TABS, Take 1 tablet (10 mg total) by mouth 4 (four) times daily as needed., Disp: 120 tablet, Rfl: 0 .  pyridostigmine (MESTINON) 60 MG tablet, Take 60 mg by mouth 4 (four) times daily., Disp: , Rfl:  .  rosuvastatin (CRESTOR) 5 MG tablet, Take 5 mg by mouth daily., Disp: , Rfl: 0 .  sacubitril-valsartan (ENTRESTO) 24-26 MG, Take 1 tablet by mouth 2 (two) times daily., Disp: 60 tablet, Rfl: 6 .  triamcinolone cream (KENALOG) 0.1 %, apply to affected area twice a day UNTIL CLEAR as needed, Disp: , Rfl: 0  Past Medical  History: Past Medical History:  Diagnosis Date  . Allergy    dust, seasonal (worse in the fall).  . 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 Followed by Pain Specialist in [place], back and neck  . Asthma    BRONCHITIS  . Cataract    First Dx in 2012  . CHF (congestive heart failure) ([place])   . Coronary artery disease    a. Prior Ant MI->s/p multiple stents placed in the LAD and right coronary artery ([place]); b. 2016 Cath: reportedly nonobs dzs;  c. 02/2017 MV: EF 45-54%, ap/ant, ap/inf, apical infarct, no ishcemia; c. 04/2018 Cath/PCI: LM nl, LAD 20p, patent mid stent, LCX [id number] Sierra DES), OM1 nl, OM2 50, OM3 40 w/ patent stent, RCA 40p, 257m40d w/ patent stent in RPDA, RPAV 60, EF 25-35%. 2+MR.  . Diabetes mellitus without complication ([place]   TYPE 2  . Diabetic peripheral neuropathy (H[facility])    feet and hands  . GERD (gastroesophageal reflux disease)   . Headache    muscle tension  . Hyperlipidemia   . Hypertension    CONTROLLED ON MEDS  . Insomnia   . Ischemic cardiomyopathy    a. 03/2018 Echo: EF 30-35%, ant, antlat, apical AK, Gr1 DD, mild MR, mildly dil LA.  . Marland Kitchenyme disease    Chronic  . My[place]henia gravis ([place]  . [place]  . Seasonal allergies   . Sleep apnea    CPAP    Tobacco Use: Social History   Tobacco Use  Smoking Status Never Smoker  Smokeless Tobacco Never Used  Tobacco Comment   smoking cessation materials not required    Labs: Recent Review Flowsheet Data    Labs for ITP Cardiac and Pulmonary Rehab Latest Ref Rng & Units 08/22/2015 11/29/2017 01/17/2018 01/25/2018   Cholestrol 100 - 199 mg/dL - 141 113 -   LDLCALC 0 - 99 mg/dL - 77 62 -   HDL >39 mg/dL - 38(L) 33(L) -   Trlycerides 0 - 149 mg/dL - 130 92 -   Hemoglobin A1c - 8.1 - - 7.2       Exercise Target Goals: Exercise Program Goal: Individual exercise prescription set using results from initial 6 min walk test and THRR while considering  patient's activity barriers and  safety.   Exercise Prescription Goal: Initial exercise prescription builds to 30-45 minutes a day of aerobic activity, 2-3 days per week.  Home exercise guidelines will be given to patient during program as part of exercise prescription that the participant will acknowledge.  Activity Barriers & Risk Stratification: Activity Barriers & Cardiac Risk Stratification - 05/12/18 1213      Activity Barriers &  Cardiac Risk Stratification   Activity Barriers  Arthritis;Back Problems;Assistive Device;Neck/Spine Problems;Shortness of Breath;Muscular Weakness   Lower spine arthritis, does Steriod shots at pain clininc:unable at present because on Plavix. has mynesthenia gravis   Cardiac Risk Stratification  High       6 Minute Walk: 6 Minute Walk    Row Name 05/12/18 1353         6 Minute Walk   Distance  1224 feet     Walk Time  6 minutes     # of Rest Breaks  0     MPH  2.32     METS  3.08     RPE  12     Perceived Dyspnea   2     VO2 Peak  10.8     Symptoms  No     Resting HR  97 bpm     Resting BP  126/72     Resting Oxygen Saturation   92 %     Exercise Oxygen Saturation  during 6 min walk  87 %     Max Ex. HR  116 bpm     Max Ex. BP  126/64     2 Minute Post BP  120/64        Oxygen Initial Assessment:   Oxygen Re-Evaluation:   Oxygen Discharge (Final Oxygen Re-Evaluation):   Initial Exercise Prescription: Initial Exercise Prescription - 05/12/18 1300      Date of Initial Exercise RX and Referring Provider   Date  05/12/18    Referring Provider  Arida      Treadmill   MPH  2.3    Grade  1    Minutes  15    METs  3.08      Recumbant Bike   Level  5    RPM  60    Watts  35    Minutes  15    METs  3      T5 Nustep   Level  2    SPM  80    Minutes  15    METs  3      Prescription Details   Frequency (times per week)  3    Duration  Progress to 30 minutes of continuous aerobic without signs/symptoms of physical distress      Intensity   THRR  40-80% of Max Heartrate  123-149    Ratings of Perceived Exertion  11-13    Perceived Dyspnea  0-4      Resistance Training   Training Prescription  Yes    Weight  4 lb    Reps  10-15       Perform Capillary Blood Glucose checks as needed.  Exercise Prescription Changes:   Exercise Comments:   Exercise Goals and Review: Exercise Goals    Row Name 05/12/18 1352             Exercise Goals   Increase Physical Activity  Yes       Intervention  Provide advice, education, support and counseling about physical activity/exercise needs.;Develop an individualized exercise prescription for aerobic and resistive training based on initial evaluation findings, risk stratification, comorbidities and participant's personal goals.       Expected Outcomes  Short Term: Attend rehab on a regular basis to increase amount of physical activity.;Long Term: Add in home exercise to make exercise part of routine and to increase amount of physical activity.;Long Term: Exercising regularly at least 3-5 days a  week.       Increase Strength and Stamina  Yes       Intervention  Provide advice, education, support and counseling about physical activity/exercise needs.;Develop an individualized exercise prescription for aerobic and resistive training based on initial evaluation findings, risk stratification, comorbidities and participant's personal goals.       Expected Outcomes  Short Term: Increase workloads from initial exercise prescription for resistance, speed, and METs.;Short Term: Perform resistance training exercises routinely during rehab and add in resistance training at home;Long Term: Improve cardiorespiratory fitness, muscular endurance and strength as measured by increased METs and functional capacity (6MWT)       Able to understand and use rate of perceived exertion (RPE) scale  Yes       Intervention  Provide education and explanation on how to use RPE scale       Expected Outcomes  Short Term:  Able to use RPE daily in rehab to express subjective intensity level;Long Term:  Able to use RPE to guide intensity level when exercising independently       Able to understand and use Dyspnea scale  Yes       Intervention  Provide education and explanation on how to use Dyspnea scale       Expected Outcomes  Short Term: Able to use Dyspnea scale daily in rehab to express subjective sense of shortness of breath during exertion;Long Term: Able to use Dyspnea scale to guide intensity level when exercising independently       Knowledge and understanding of Target Heart Rate Range (THRR)  Yes       Intervention  Provide education and explanation of THRR including how the numbers were predicted and where they are located for reference       Expected Outcomes  Short Term: Able to state/look up THRR;Long Term: Able to use THRR to govern intensity when exercising independently;Short Term: Able to use daily as guideline for intensity in rehab       Able to check pulse independently  Yes       Intervention  Provide education and demonstration on how to check pulse in carotid and radial arteries.;Review the importance of being able to check your own pulse for safety during independent exercise       Expected Outcomes  Short Term: Able to explain why pulse checking is important during independent exercise;Long Term: Able to check pulse independently and accurately       Understanding of Exercise Prescription  Yes       Intervention  Provide education, explanation, and written materials on patient's individual exercise prescription       Expected Outcomes  Short Term: Able to explain program exercise prescription;Long Term: Able to explain home exercise prescription to exercise independently          Exercise Goals Re-Evaluation :   Discharge Exercise Prescription (Final Exercise Prescription Changes):   Nutrition:  Target Goals: Understanding of nutrition guidelines, daily intake of sodium '1500mg'$ ,  cholesterol '200mg'$ , calories 30% from fat and 7% or less from saturated fats, daily to have 5 or more servings of fruits and vegetables.  Biometrics: Pre Biometrics - 05/12/18 1351      Pre Biometrics   Height  '5\' 8"'$  (1.727 m)    Weight  232 lb 3.2 oz (105.3 kg)    Waist Circumference  47 inches    Hip Circumference  45 inches    Waist to Hip Ratio  1.04 %    BMI (  Calculated)  35.31    Single Leg Stand  1 seconds   neuropathy       Nutrition Therapy Plan and Nutrition Goals: Nutrition Therapy & Goals - 05/12/18 1220      Intervention Plan   Intervention  Prescribe, educate and counsel regarding individualized specific dietary modifications aiming towards targeted core components such as weight, hypertension, lipid management, diabetes, heart failure and other comorbidities.    Expected Outcomes  Short Term Goal: Understand basic principles of dietary content, such as calories, fat, sodium, cholesterol and nutrients.;Short Term Goal: A plan has been developed with personal nutrition goals set during dietitian appointment.;Long Term Goal: Adherence to prescribed nutrition plan.       Nutrition Assessments: Nutrition Assessments - 05/12/18 1221      MEDFICTS Scores   Pre Score  55       Nutrition Goals Re-Evaluation:   Nutrition Goals Discharge (Final Nutrition Goals Re-Evaluation):   Psychosocial: Target Goals: Acknowledge presence or absence of significant depression and/or stress, maximize coping skills, provide positive support system. Participant is able to verbalize types and ability to use techniques and skills needed for reducing stress and depression.   Initial Review & Psychosocial Screening: Initial Psych Review & Screening - 05/12/18 1216      Initial Review   Current issues with  Current Stress Concerns    Source of Stress Concerns  Chronic Illness;Unable to perform yard/household activities;Family    Comments  Household has changed: children moved back  home. Good and bad stress with his. Son is helpful; new chaos in house.       Family Dynamics   Good Support System?  Yes   Wife, son,grandson, family friends     Barriers   Psychosocial barriers to participate in program  There are no identifiable barriers or psychosocial needs.;The patient should benefit from training in stress management and relaxation.      Screening Interventions   Interventions  Encouraged to exercise;To provide support and resources with identified psychosocial needs;Provide feedback about the scores to participant    Expected Outcomes  Short Term goal: Utilizing psychosocial counselor, staff and physician to assist with identification of specific Stressors or current issues interfering with healing process. Setting desired goal for each stressor or current issue identified.;Long Term Goal: Stressors or current issues are controlled or eliminated.;Short Term goal: Identification and review with participant of any Quality of Life or Depression concerns found by scoring the questionnaire.;Long Term goal: The participant improves quality of Life and PHQ9 Scores as seen by post scores and/or verbalization of changes       Quality of Life Scores:  Quality of Life - 05/12/18 1217      Quality of Life   Select  Quality of Life      Quality of Life Scores   Health/Function Pre  18.53 %    Socioeconomic Pre  26.5 %    Psych/Spiritual Pre  28.58 %    Family Pre  24.4 %    GLOBAL Pre  22.83 %      Scores of 19 and below usually indicate a poorer quality of life in these areas.  A difference of  2-3 points is a clinically meaningful difference.  A difference of 2-3 points in the total score of the Quality of Life Index has been associated with significant improvement in overall quality of life, self-image, physical symptoms, and general health in studies assessing change in quality of life.  PHQ-9: Recent Review Flowsheet Data  Depression screen [facility] 2/9 05/12/2018  05/06/2018 03/17/2018 02/25/2018 01/17/2018   Decreased Interest - 0 0 0 0   Down, Depressed, Hopeless - 0 0 0 0   PHQ - 2 Score - 0 0 0 0   Altered sleeping 1 - - 0 -   Tired, decreased energy 2 - - 0 -   Change in appetite 0 - - 0 -   Feeling bad or failure about yourself  0 - - 0 -   Trouble concentrating 0 - - 0 -   Moving slowly or fidgety/restless 0 - - 0 -   Suicidal thoughts 0 - - 0 -   PHQ-9 Score - - - 0 -   Difficult doing work/chores Not difficult at all - - Not difficult at all -     Interpretation of Total Score  Total Score Depression Severity:  1-4 = Minimal depression, 5-9 = Mild depression, 10-14 = Moderate depression, 15-19 = Moderately severe depression, 20-27 = Severe depression   Psychosocial Evaluation and Intervention:   Psychosocial Re-Evaluation:   Psychosocial Discharge (Final Psychosocial Re-Evaluation):   Vocational Rehabilitation: Provide vocational rehab assistance to qualifying candidates.   Vocational Rehab Evaluation & Intervention: Vocational Rehab - 05/12/18 1222      Initial Vocational Rehab Evaluation & Intervention   Assessment shows need for Vocational Rehabilitation  No       Education: Education Goals: Education classes will be provided on a variety of topics geared toward better understanding of heart health and risk factor modification. Participant will state understanding/return demonstration of topics presented as noted by education test scores.  Learning Barriers/Preferences: Learning Barriers/Preferences - 05/12/18 1221      Learning Barriers/Preferences   Learning Barriers  None    Learning Preferences  None       Education Topics:  AED/CPR: - Group verbal and written instruction with the use of models to demonstrate the basic use of the AED with the basic ABC's of resuscitation.   General Nutrition Guidelines/Fats and Fiber: -Group instruction provided by verbal, written material, models and posters to present the  general guidelines for heart healthy nutrition. Gives an explanation and review of dietary fats and fiber.   Controlling Sodium/Reading Food Labels: -Group verbal and written material supporting the discussion of sodium use in heart healthy nutrition. Review and explanation with models, verbal and written materials for utilization of the food label.   Exercise Physiology & General Exercise Guidelines: - Group verbal and written instruction with models to review the exercise physiology of the cardiovascular system and associated critical values. Provides general exercise guidelines with specific guidelines to those with heart or lung disease.    Aerobic Exercise & Resistance Training: - Gives group verbal and written instruction on the various components of exercise. Focuses on aerobic and resistive training programs and the benefits of this training and how to safely progress through these programs..   Flexibility, Balance, Mind/Body Relaxation: Provides group verbal/written instruction on the benefits of flexibility and balance training, including mind/body exercise modes such as yoga, pilates and tai chi.  Demonstration and skill practice provided.   Stress and Anxiety: - Provides group verbal and written instruction about the health risks of elevated stress and causes of high stress.  Discuss the correlation between heart/lung disease and anxiety and treatment options. Review healthy ways to manage with stress and anxiety.   Depression: - Provides group verbal and written instruction on the correlation between heart/lung disease and depressed mood, treatment options, and  the stigmas associated with seeking treatment.   Anatomy & Physiology of the Heart: - Group verbal and written instruction and models provide basic cardiac anatomy and physiology, with the coronary electrical and arterial systems. Review of Valvular disease and Heart Failure   Cardiac Procedures: - Group verbal and  written instruction to review commonly prescribed medications for heart disease. Reviews the medication, class of the drug, and side effects. Includes the steps to properly store meds and maintain the prescription regimen. (beta blockers and nitrates)   Cardiac Medications I: - Group verbal and written instruction to review commonly prescribed medications for heart disease. Reviews the medication, class of the drug, and side effects. Includes the steps to properly store meds and maintain the prescription regimen.   Cardiac Medications II: -Group verbal and written instruction to review commonly prescribed medications for heart disease. Reviews the medication, class of the drug, and side effects. (all other drug classes)    Go Sex-Intimacy & Heart Disease, Get SMART - Goal Setting: - Group verbal and written instruction through game format to discuss heart disease and the return to sexual intimacy. Provides group verbal and written material to discuss and apply goal setting through the application of the S.M.A.R.T. Method.   Other Matters of the Heart: - Provides group verbal, written materials and models to describe Stable Angina and Peripheral Artery. Includes description of the disease process and treatment options available to the cardiac patient.   Exercise & Equipment Safety: - Individual verbal instruction and demonstration of equipment use and safety with use of the equipment.   Cardiac Rehab from 05/12/2018 in [facility] Cardiac and Pulmonary Rehab  Date  05/12/18  Educator  SB  Instruction Review Code  1- Verbalizes Understanding      Infection Prevention: - Provides verbal and written material to individual with discussion of infection control including proper hand washing and proper equipment cleaning during exercise session.   Cardiac Rehab from 05/12/2018 in [facility] Cardiac and Pulmonary Rehab  Date  05/12/18  Educator  Sb  Instruction Review Code  1- Verbalizes Understanding       Falls Prevention: - Provides verbal and written material to individual with discussion of falls prevention and safety.   Cardiac Rehab from 05/12/2018 in [facility] Cardiac and Pulmonary Rehab  Date  05/12/18  Educator  SB  Instruction Review Code  1- Verbalizes Understanding      Diabetes: - Individual verbal and written instruction to review signs/symptoms of diabetes, desired ranges of glucose level fasting, after meals and with exercise. Acknowledge that pre and post exercise glucose checks will be done for 3 sessions at entry of program.   Cardiac Rehab from 05/12/2018 in [facility] Cardiac and Pulmonary Rehab  Date  05/12/18  Educator  SB  Instruction Review Code  1- Verbalizes Understanding      Know Your Numbers and Risk Factors: -Group verbal and written instruction about important numbers in your health.  Discussion of what are risk factors and how they play a role in the disease process.  Review of Cholesterol, Blood Pressure, Diabetes, and BMI and the role they play in your overall health.   Sleep Hygiene: -Provides group verbal and written instruction about how sleep can affect your health.  Define sleep hygiene, discuss sleep cycles and impact of sleep habits. Review good sleep hygiene tips.    Other: -Provides group and verbal instruction on various topics (see comments)   Knowledge Questionnaire Score: Knowledge Questionnaire Score - 05/12/18 1221  Knowledge Questionnaire Score   Pre Score  25/26   correct response reviewed with [name]. He vebalized understanding. No further questions today      Core Components/Risk Factors/Patient Goals at Admission: Personal Goals and Risk Factors at Admission - 05/12/18 1222      Core Components/Risk Factors/Patient Goals on Admission    Weight Management  Obesity;Weight Loss;Yes    Intervention  Weight Management: Develop a combined nutrition and exercise program designed to reach desired caloric intake, while maintaining  appropriate intake of nutrient and fiber, sodium and fats, and appropriate energy expenditure required for the weight goal.    Admit Weight  232 lb 3.2 oz (105.3 kg)    Goal Weight: Short Term  230 lb (104.3 kg)    Goal Weight: Long Term  190 lb (86.2 kg)    Expected Outcomes  Short Term: Continue to assess and modify interventions until short term weight is achieved;Long Term: Adherence to nutrition and physical activity/exercise program aimed toward attainment of established weight goal;Weight Loss: Understanding of general recommendations for a balanced deficit meal plan, which promotes 1-2 lb weight loss per week and includes a negative energy balance of [phone] kcal/d    Diabetes  Yes    Intervention  Provide education about signs/symptoms and action to take for hypo/hyperglycemia.;Provide education about proper nutrition, including hydration, and aerobic/resistive exercise prescription along with prescribed medications to achieve blood glucose in normal ranges: Fasting glucose 65-99 mg/dL    Expected Outcomes  Short Term: Participant verbalizes understanding of the signs/symptoms and immediate care of hyper/hypoglycemia, proper foot care and importance of medication, aerobic/resistive exercise and nutrition plan for blood glucose control.;Long Term: Attainment of HbA1C < 7%.    Heart Failure  Yes    Intervention  Provide a combined exercise and nutrition program that is supplemented with education, support and counseling about heart failure. Directed toward relieving symptoms such as shortness of breath, decreased exercise tolerance, and extremity edema.    Expected Outcomes  Improve functional capacity of life;Short term: Attendance in program 2-3 days a week with increased exercise capacity. Reported lower sodium intake. Reported increased fruit and vegetable intake. Reports medication compliance.;Short term: Daily weights obtained and reported for increase. Utilizing diuretic protocols set by  physician.;Long term: Adoption of self-care skills and reduction of barriers for early signs and symptoms recognition and intervention leading to self-care maintenance.    Hypertension  Yes    Intervention  Provide education on lifestyle modifcations including regular physical activity/exercise, weight management, moderate sodium restriction and increased consumption of fresh fruit, vegetables, and low fat dairy, alcohol moderation, and smoking cessation.;Monitor prescription use compliance.    Expected Outcomes  Short Term: Continued assessment and intervention until BP is < 140/75m HG in hypertensive participants. < 130/863mHG in hypertensive participants with diabetes, heart failure or chronic kidney disease.;Long Term: Maintenance of blood pressure at goal levels.    Lipids  Yes    Intervention  Provide education and support for participant on nutrition & aerobic/resistive exercise along with prescribed medications to achieve LDL '70mg'$ , HDL >'40mg'$ .    Expected Outcomes  Short Term: Participant states understanding of desired cholesterol values and is compliant with medications prescribed. Participant is following exercise prescription and nutrition guidelines.;Long Term: Cholesterol controlled with medications as prescribed, with individualized exercise RX and with personalized nutrition plan. Value goals: LDL < '70mg'$ , HDL > 40 mg.       Core Components/Risk Factors/Patient Goals Review:    Core Components/Risk Factors/Patient Goals at Discharge (  Final Review):    ITP Comments: ITP Comments    Row Name 05/12/18 1225 05/18/18 0849         ITP Comments  Medical review completed today. INitial ITP sent to Dr Caryn Section to review, change as needed and to sign. Diagnosis documentation can be found in [facility] 04/25/2018 Encounter  30 day review completed. ITP sent to Dr. Ramonita Lab, covering for Dr. Emily Filbert, Medical Director of Cardiac Rehab. Continue with ITP unless changes are made by physician.  New to program. Has yet to start exercise.          Comments: 30 day reveiw

## 2018-05-19 ENCOUNTER — Encounter: Payer: Medicare Other | Admitting: *Deleted

## 2018-05-19 DIAGNOSIS — Z794 Long term (current) use of insulin: Secondary | ICD-10-CM | POA: Diagnosis not present

## 2018-05-19 DIAGNOSIS — I25118 Atherosclerotic heart disease of native coronary artery with other forms of angina pectoris: Secondary | ICD-10-CM | POA: Diagnosis not present

## 2018-05-19 DIAGNOSIS — Z955 Presence of coronary angioplasty implant and graft: Secondary | ICD-10-CM | POA: Diagnosis not present

## 2018-05-19 DIAGNOSIS — Z79899 Other long term (current) drug therapy: Secondary | ICD-10-CM | POA: Diagnosis not present

## 2018-05-19 DIAGNOSIS — Z7982 Long term (current) use of aspirin: Secondary | ICD-10-CM | POA: Diagnosis not present

## 2018-05-19 DIAGNOSIS — J45909 Unspecified asthma, uncomplicated: Secondary | ICD-10-CM | POA: Diagnosis not present

## 2018-05-19 DIAGNOSIS — I110 Hypertensive heart disease with heart failure: Secondary | ICD-10-CM | POA: Diagnosis not present

## 2018-05-19 DIAGNOSIS — E1142 Type 2 diabetes mellitus with diabetic polyneuropathy: Secondary | ICD-10-CM | POA: Diagnosis not present

## 2018-05-19 DIAGNOSIS — I5022 Chronic systolic (congestive) heart failure: Secondary | ICD-10-CM | POA: Diagnosis not present

## 2018-05-19 DIAGNOSIS — E785 Hyperlipidemia, unspecified: Secondary | ICD-10-CM | POA: Diagnosis not present

## 2018-05-19 LAB — GLUCOSE, CAPILLARY
Glucose-Capillary: 214 mg/dL — ABNORMAL HIGH (ref 70–99)
Glucose-Capillary: 212 mg/dL — ABNORMAL HIGH (ref 70–99)

## 2018-05-19 NOTE — Progress Notes (Signed)
Daily Session Note  Patient Details  Name: [name] MRN: [id number] Date of Birth: June 06, 1961 Referring Provider:     Cardiac Rehab from 05/12/2018 in [facility] Cardiac and Pulmonary Rehab  Referring Provider  Fletcher Anon      Encounter Date: 05/19/2018  Check In: Session Check In - 05/19/18 0934      Check-In   Supervising physician immediately available to respond to emergencies  See telemetry face sheet for immediately available ER MD    Location  [facility]-Cardiac & Pulmonary Rehab    Staff Present  Joellyn Rued, BS, PEC;Carroll Enterkin, RN, BSN;Jessica [place], MA, RCEP, CCRP, Exercise Physiologist    Medication changes reported      No    Fall or balance concerns reported     No    Warm-up and Cool-down  Performed on first and last piece of equipment    Resistance Training Performed  Yes    VAD Patient?  No    PAD/SET Patient?  No      Pain Assessment   Currently in Pain?  No/denies          Social History   Tobacco Use  Smoking Status Never Smoker  Smokeless Tobacco Never Used  Tobacco Comment   smoking cessation materials not required    Goals Met:  Independence with exercise equipment Exercise tolerated well Personal goals reviewed No report of cardiac concerns or symptoms Strength training completed today  Goals Unmet:  Not Applicable  Comments: First full day of exercise!  Patient was oriented to gym and equipment including functions, settings, policies, and procedures.  Patient's individual exercise prescription and treatment plan were reviewed.  All starting workloads were established based on the results of the 6 minute walk test done at initial orientation visit.  The plan for exercise progression was also introduced and progression will be customized based on patient's performance and goals.     Dr. Emily Filbert is Medical Director for [place] and [facility].

## 2018-05-20 DIAGNOSIS — G4733 Obstructive sleep apnea (adult) (pediatric): Secondary | ICD-10-CM | POA: Diagnosis not present

## 2018-05-20 DIAGNOSIS — G7000 Myasthenia gravis without (acute) exacerbation: Secondary | ICD-10-CM | POA: Diagnosis not present

## 2018-05-20 DIAGNOSIS — J4520 Mild intermittent asthma, uncomplicated: Secondary | ICD-10-CM | POA: Diagnosis not present

## 2018-05-20 DIAGNOSIS — R0609 Other forms of dyspnea: Secondary | ICD-10-CM | POA: Diagnosis not present

## 2018-05-24 ENCOUNTER — Encounter: Payer: Medicare Other | Admitting: *Deleted

## 2018-05-24 DIAGNOSIS — J45909 Unspecified asthma, uncomplicated: Secondary | ICD-10-CM | POA: Diagnosis not present

## 2018-05-24 DIAGNOSIS — I110 Hypertensive heart disease with heart failure: Secondary | ICD-10-CM | POA: Diagnosis not present

## 2018-05-24 DIAGNOSIS — I5022 Chronic systolic (congestive) heart failure: Secondary | ICD-10-CM | POA: Diagnosis not present

## 2018-05-24 DIAGNOSIS — E1142 Type 2 diabetes mellitus with diabetic polyneuropathy: Secondary | ICD-10-CM | POA: Diagnosis not present

## 2018-05-24 DIAGNOSIS — Z955 Presence of coronary angioplasty implant and graft: Secondary | ICD-10-CM | POA: Diagnosis not present

## 2018-05-24 DIAGNOSIS — Z7982 Long term (current) use of aspirin: Secondary | ICD-10-CM | POA: Diagnosis not present

## 2018-05-24 DIAGNOSIS — I25118 Atherosclerotic heart disease of native coronary artery with other forms of angina pectoris: Secondary | ICD-10-CM | POA: Diagnosis not present

## 2018-05-24 DIAGNOSIS — Z794 Long term (current) use of insulin: Secondary | ICD-10-CM | POA: Diagnosis not present

## 2018-05-24 DIAGNOSIS — E785 Hyperlipidemia, unspecified: Secondary | ICD-10-CM | POA: Diagnosis not present

## 2018-05-24 DIAGNOSIS — Z79899 Other long term (current) drug therapy: Secondary | ICD-10-CM | POA: Diagnosis not present

## 2018-05-24 LAB — GLUCOSE, CAPILLARY
Glucose-Capillary: 294 mg/dL — ABNORMAL HIGH (ref 70–99)
Glucose-Capillary: 228 mg/dL — ABNORMAL HIGH (ref 70–99)

## 2018-05-24 NOTE — Progress Notes (Signed)
Daily Session Note  Patient Details  Name: RAIYAN DALESANDRO MRN: [id number] Date of Birth: 1961-07-20 Referring Provider:     Cardiac Rehab from 05/12/2018 in [facility] Cardiac and Pulmonary Rehab  Referring Provider  Fletcher Anon      Encounter Date: 05/24/2018  Check In: Session Check In - 05/24/18 0941      Check-In   Supervising physician immediately available to respond to emergencies  See telemetry face sheet for immediately available ER MD    Location  [facility]-Cardiac & Pulmonary Rehab    Staff Present  Joellyn Rued, BS, PEC;Krista [place], RN BSN;Jessica [place], [place], RCEP, CCRP, Exercise Physiologist    Medication changes reported      No    Warm-up and Cool-down  Performed on first and last piece of equipment    Resistance Training Performed  Yes    VAD Patient?  No    PAD/SET Patient?  No      Pain Assessment   Currently in Pain?  No/denies          Social History   Tobacco Use  Smoking Status Never Smoker  Smokeless Tobacco Never Used  Tobacco Comment   smoking cessation materials not required    Goals Met:  Independence with exercise equipment Exercise tolerated well No report of cardiac concerns or symptoms Strength training completed today  Goals Unmet:  Not Applicable  Comments: Pt able to follow exercise prescription today without complaint.  Will continue to monitor for progression.    Dr. Emily Filbert is Medical Director for [place] and [facility].

## 2018-05-25 DIAGNOSIS — E1142 Type 2 diabetes mellitus with diabetic polyneuropathy: Secondary | ICD-10-CM | POA: Diagnosis not present

## 2018-05-25 DIAGNOSIS — E785 Hyperlipidemia, unspecified: Secondary | ICD-10-CM | POA: Diagnosis not present

## 2018-05-25 DIAGNOSIS — I110 Hypertensive heart disease with heart failure: Secondary | ICD-10-CM | POA: Diagnosis not present

## 2018-05-25 DIAGNOSIS — Z7982 Long term (current) use of aspirin: Secondary | ICD-10-CM | POA: Diagnosis not present

## 2018-05-25 DIAGNOSIS — I5022 Chronic systolic (congestive) heart failure: Secondary | ICD-10-CM | POA: Diagnosis not present

## 2018-05-25 DIAGNOSIS — Z955 Presence of coronary angioplasty implant and graft: Secondary | ICD-10-CM | POA: Diagnosis not present

## 2018-05-25 DIAGNOSIS — I25118 Atherosclerotic heart disease of native coronary artery with other forms of angina pectoris: Secondary | ICD-10-CM | POA: Diagnosis not present

## 2018-05-25 DIAGNOSIS — Z79899 Other long term (current) drug therapy: Secondary | ICD-10-CM | POA: Diagnosis not present

## 2018-05-25 DIAGNOSIS — Z794 Long term (current) use of insulin: Secondary | ICD-10-CM | POA: Diagnosis not present

## 2018-05-25 DIAGNOSIS — J45909 Unspecified asthma, uncomplicated: Secondary | ICD-10-CM | POA: Diagnosis not present

## 2018-05-25 LAB — GLUCOSE, CAPILLARY
GLUCOSE-CAPILLARY: 154 mg/dL — AB (ref 70–99)
Glucose-Capillary: 148 mg/dL — ABNORMAL HIGH (ref 70–99)

## 2018-05-25 NOTE — Progress Notes (Signed)
Daily Session Note  Patient Details  Name: GRANGER CHUI MRN: [id number] Date of Birth: 1961/07/04 Referring Provider:     Cardiac Rehab from 05/12/2018 in [facility] Cardiac and Pulmonary Rehab  Referring Provider  Fletcher Anon      Encounter Date: 05/25/2018  Check In: Session Check In - 05/25/18 9355      Check-In   Supervising physician immediately available to respond to emergencies  See telemetry face sheet for immediately available ER MD    Location  [facility]-Cardiac & Pulmonary Rehab    Staff Present  Alberteen Sam, MA, RCEP, CCRP, Exercise Physiologist;Skiler Tye [place], RN BSN    Medication changes reported      No    Fall or balance concerns reported     No    Warm-up and Cool-down  Performed on first and last piece of equipment    Resistance Training Performed  Yes    VAD Patient?  No      Pain Assessment   Currently in Pain?  No/denies          Social History   Tobacco Use  Smoking Status Never Smoker  Smokeless Tobacco Never Used  Tobacco Comment   smoking cessation materials not required    Goals Met:  Independence with exercise equipment Exercise tolerated well No report of cardiac concerns or symptoms Strength training completed today  Goals Unmet:  Not Applicable  Comments: Pt able to follow exercise prescription today without complaint.  Will continue to monitor for progression.   Dr. Emily Filbert is Medical Director for [place] and [facility].

## 2018-05-27 ENCOUNTER — Ambulatory Visit: Payer: Medicare Other | Admitting: Cardiovascular Disease

## 2018-05-31 ENCOUNTER — Encounter: Payer: Medicare Other | Admitting: *Deleted

## 2018-05-31 DIAGNOSIS — I25118 Atherosclerotic heart disease of native coronary artery with other forms of angina pectoris: Secondary | ICD-10-CM | POA: Diagnosis not present

## 2018-05-31 DIAGNOSIS — I110 Hypertensive heart disease with heart failure: Secondary | ICD-10-CM | POA: Diagnosis not present

## 2018-05-31 DIAGNOSIS — Z794 Long term (current) use of insulin: Secondary | ICD-10-CM | POA: Diagnosis not present

## 2018-05-31 DIAGNOSIS — Z955 Presence of coronary angioplasty implant and graft: Secondary | ICD-10-CM

## 2018-05-31 DIAGNOSIS — Z79899 Other long term (current) drug therapy: Secondary | ICD-10-CM | POA: Diagnosis not present

## 2018-05-31 DIAGNOSIS — Z7982 Long term (current) use of aspirin: Secondary | ICD-10-CM | POA: Diagnosis not present

## 2018-05-31 DIAGNOSIS — I5022 Chronic systolic (congestive) heart failure: Secondary | ICD-10-CM | POA: Diagnosis not present

## 2018-05-31 DIAGNOSIS — E1142 Type 2 diabetes mellitus with diabetic polyneuropathy: Secondary | ICD-10-CM | POA: Diagnosis not present

## 2018-05-31 DIAGNOSIS — J45909 Unspecified asthma, uncomplicated: Secondary | ICD-10-CM | POA: Diagnosis not present

## 2018-05-31 DIAGNOSIS — E785 Hyperlipidemia, unspecified: Secondary | ICD-10-CM | POA: Diagnosis not present

## 2018-05-31 NOTE — Progress Notes (Signed)
Daily Session Note  Patient Details  Name: [name] MRN: [id number] Date of Birth: Jul 06, 1961 Referring Provider:     Cardiac Rehab from 05/12/2018 in [facility] Cardiac and Pulmonary Rehab  Referring Provider  Arida      Encounter Date: 05/31/2018  Check In: Session Check In - 05/31/18 8938      Check-In   Supervising physician immediately available to respond to emergencies  See telemetry face sheet for immediately available ER MD    Location  [facility]-Cardiac & Pulmonary Rehab    Staff Present  Heath Lark, RN, BSN, CCRP;Mandi [place], BS, PEC;Noel Rodier , [place], RCEP, CCRP, Exercise Physiologist;Amanda [place], [place], ACSM CEP, Exercise Physiologist    Medication changes reported      No    Fall or balance concerns reported     No    Warm-up and Cool-down  Performed on first and last piece of equipment    Resistance Training Performed  Yes    VAD Patient?  No    PAD/SET Patient?  No      Pain Assessment   Currently in Pain?  No/denies          Social History   Tobacco Use  Smoking Status Never Smoker  Smokeless Tobacco Never Used  Tobacco Comment   smoking cessation materials not required    Goals Met:  Independence with exercise equipment Exercise tolerated well No report of cardiac concerns or symptoms Strength training completed today  Goals Unmet:  Not Applicable  Comments: Pt able to follow exercise prescription today without complaint.  Will continue to monitor for progression.    Dr. Emily Filbert is Medical Director for [place] and [facility].

## 2018-06-01 DIAGNOSIS — E1159 Type 2 diabetes mellitus with other circulatory complications: Secondary | ICD-10-CM | POA: Diagnosis not present

## 2018-06-01 DIAGNOSIS — E1169 Type 2 diabetes mellitus with other specified complication: Secondary | ICD-10-CM | POA: Diagnosis not present

## 2018-06-01 DIAGNOSIS — Z794 Long term (current) use of insulin: Secondary | ICD-10-CM | POA: Diagnosis not present

## 2018-06-01 DIAGNOSIS — E785 Hyperlipidemia, unspecified: Secondary | ICD-10-CM | POA: Diagnosis not present

## 2018-06-01 DIAGNOSIS — E1142 Type 2 diabetes mellitus with diabetic polyneuropathy: Secondary | ICD-10-CM | POA: Diagnosis not present

## 2018-06-01 LAB — HEMOGLOBIN A1C: HEMOGLOBIN A1C: 7.9 % — AB (ref 4.0–5.6)

## 2018-06-02 ENCOUNTER — Telehealth: Payer: Self-pay

## 2018-06-02 NOTE — Telephone Encounter (Signed)
[name] is having trouble with myasthenia gravis today.  He will try to attend the 7:30 am class tomorrow

## 2018-06-04 ENCOUNTER — Other Ambulatory Visit: Payer: Self-pay | Admitting: Nurse Practitioner

## 2018-06-04 DIAGNOSIS — J302 Other seasonal allergic rhinitis: Secondary | ICD-10-CM

## 2018-06-07 ENCOUNTER — Other Ambulatory Visit: Payer: Self-pay

## 2018-06-07 ENCOUNTER — Encounter: Payer: Medicare Other | Attending: Cardiovascular Disease | Admitting: *Deleted

## 2018-06-07 DIAGNOSIS — Z794 Long term (current) use of insulin: Secondary | ICD-10-CM | POA: Diagnosis not present

## 2018-06-07 DIAGNOSIS — I5022 Chronic systolic (congestive) heart failure: Secondary | ICD-10-CM | POA: Diagnosis not present

## 2018-06-07 DIAGNOSIS — E1142 Type 2 diabetes mellitus with diabetic polyneuropathy: Secondary | ICD-10-CM | POA: Diagnosis not present

## 2018-06-07 DIAGNOSIS — J45909 Unspecified asthma, uncomplicated: Secondary | ICD-10-CM | POA: Diagnosis not present

## 2018-06-07 DIAGNOSIS — E669 Obesity, unspecified: Secondary | ICD-10-CM | POA: Insufficient documentation

## 2018-06-07 DIAGNOSIS — I25118 Atherosclerotic heart disease of native coronary artery with other forms of angina pectoris: Secondary | ICD-10-CM | POA: Insufficient documentation

## 2018-06-07 DIAGNOSIS — Z7982 Long term (current) use of aspirin: Secondary | ICD-10-CM | POA: Diagnosis not present

## 2018-06-07 DIAGNOSIS — E785 Hyperlipidemia, unspecified: Secondary | ICD-10-CM | POA: Diagnosis not present

## 2018-06-07 DIAGNOSIS — Z955 Presence of coronary angioplasty implant and graft: Secondary | ICD-10-CM | POA: Diagnosis not present

## 2018-06-07 DIAGNOSIS — Z79890 Hormone replacement therapy (postmenopausal): Secondary | ICD-10-CM | POA: Insufficient documentation

## 2018-06-07 DIAGNOSIS — Z79899 Other long term (current) drug therapy: Secondary | ICD-10-CM | POA: Diagnosis not present

## 2018-06-07 DIAGNOSIS — J302 Other seasonal allergic rhinitis: Secondary | ICD-10-CM

## 2018-06-07 DIAGNOSIS — I110 Hypertensive heart disease with heart failure: Secondary | ICD-10-CM | POA: Diagnosis not present

## 2018-06-07 MED ORDER — LEVOCETIRIZINE DIHYDROCHLORIDE 5 MG PO TABS: 5.0000 mg | ORAL_TABLET | Freq: Every evening | ORAL | 3 refills | Status: DC

## 2018-06-07 NOTE — Telephone Encounter (Signed)
Lft mess to schedule appt

## 2018-06-07 NOTE — Telephone Encounter (Signed)
Refill request for general medication: Levocetitizine 5mg    Last office visit: 02/08/2018  Last physical exam: 06/16/2017  Follow-ups on file. None indicated

## 2018-06-07 NOTE — Progress Notes (Signed)
Daily Session Note  Patient Details  Name: [name] MRN: [id number] Date of Birth: Feb 20, 1961 Referring Provider:     Cardiac Rehab from 05/12/2018 in [facility] Cardiac and Pulmonary Rehab  Referring Provider  Fletcher Anon      Encounter Date: 06/07/2018  Check In: Session Check In - 06/07/18 0931      Check-In   Supervising physician immediately available to respond to emergencies  See telemetry face sheet for immediately available ER MD    Location  [facility]-Cardiac & Pulmonary Rehab    Staff Present  Nyoka Cowden, RN, BSN, [place]lette Pa, MA, RCEP, CCRP, Exercise Physiologist;Amanda Oletta Darter, [place], ACSM CEP, Exercise Physiologist    Medication changes reported      No    Fall or balance concerns reported     No    Warm-up and Cool-down  Performed on first and last piece of equipment    Resistance Training Performed  Yes    VAD Patient?  No    PAD/SET Patient?  No      Pain Assessment   Currently in Pain?  No/denies          Social History   Tobacco Use  Smoking Status Never Smoker  Smokeless Tobacco Never Used  Tobacco Comment   smoking cessation materials not required    Goals Met:  Independence with exercise equipment Exercise tolerated well Personal goals reviewed No report of cardiac concerns or symptoms Strength training completed today  Goals Unmet:  Not Applicable  Comments: Pt able to follow exercise prescription today without complaint.  Will continue to monitor for progression.    Dr. Emily Filbert is Medical Director for [place] and [facility].

## 2018-06-08 ENCOUNTER — Other Ambulatory Visit: Payer: Self-pay | Admitting: Nurse Practitioner

## 2018-06-08 DIAGNOSIS — J4541 Moderate persistent asthma with (acute) exacerbation: Secondary | ICD-10-CM

## 2018-06-09 DIAGNOSIS — I5022 Chronic systolic (congestive) heart failure: Secondary | ICD-10-CM | POA: Diagnosis not present

## 2018-06-09 DIAGNOSIS — J45909 Unspecified asthma, uncomplicated: Secondary | ICD-10-CM | POA: Diagnosis not present

## 2018-06-09 DIAGNOSIS — E1142 Type 2 diabetes mellitus with diabetic polyneuropathy: Secondary | ICD-10-CM | POA: Diagnosis not present

## 2018-06-09 DIAGNOSIS — Z7982 Long term (current) use of aspirin: Secondary | ICD-10-CM | POA: Diagnosis not present

## 2018-06-09 DIAGNOSIS — Z955 Presence of coronary angioplasty implant and graft: Secondary | ICD-10-CM

## 2018-06-09 DIAGNOSIS — E785 Hyperlipidemia, unspecified: Secondary | ICD-10-CM | POA: Diagnosis not present

## 2018-06-09 DIAGNOSIS — I110 Hypertensive heart disease with heart failure: Secondary | ICD-10-CM | POA: Diagnosis not present

## 2018-06-09 DIAGNOSIS — I25118 Atherosclerotic heart disease of native coronary artery with other forms of angina pectoris: Secondary | ICD-10-CM | POA: Diagnosis not present

## 2018-06-09 DIAGNOSIS — Z79899 Other long term (current) drug therapy: Secondary | ICD-10-CM | POA: Diagnosis not present

## 2018-06-09 DIAGNOSIS — Z794 Long term (current) use of insulin: Secondary | ICD-10-CM | POA: Diagnosis not present

## 2018-06-09 NOTE — Progress Notes (Signed)
Daily Session Note  Patient Details  Name: [name] MRN: [id number] Date of Birth: 09-May-1961 Referring Provider:     Cardiac Rehab from 05/12/2018 in [facility] Cardiac and Pulmonary Rehab  Referring Provider  Fletcher Anon      Encounter Date: 06/09/2018  Check In:      Social History   Tobacco Use  Smoking Status Never Smoker  Smokeless Tobacco Never Used  Tobacco Comment   smoking cessation materials not required    Goals Met:  Independence with exercise equipment Exercise tolerated well No report of cardiac concerns or symptoms Strength training completed today  Goals Unmet:  Not Applicable  Comments: Pt able to follow exercise prescription today without complaint.  Will continue to monitor for progression. Reviewed home exercise with pt today.  Pt plans to continue walking for exercise.  He and his wife are also going to join the [facility].  Reviewed THR, pulse, RPE, sign and symptoms, NTG use, and when to call 911 or MD.  Also discussed weather considerations and indoor options.  Pt voiced understanding.   Dr. Emily Filbert is Medical Director for [place] and [facility].

## 2018-06-10 ENCOUNTER — Encounter: Payer: Self-pay | Admitting: Family Medicine

## 2018-06-10 ENCOUNTER — Ambulatory Visit (INDEPENDENT_AMBULATORY_CARE_PROVIDER_SITE_OTHER): Payer: Medicare Other | Admitting: Family Medicine

## 2018-06-10 VITALS — BP 106/58 | HR 75 | Temp 98.2°F | Resp 16 | Ht 67.0 in | Wt 233.4 lb

## 2018-06-10 DIAGNOSIS — J4540 Moderate persistent asthma, uncomplicated: Secondary | ICD-10-CM

## 2018-06-10 DIAGNOSIS — M4697 Unspecified inflammatory spondylopathy, lumbosacral region: Secondary | ICD-10-CM | POA: Diagnosis not present

## 2018-06-10 DIAGNOSIS — G7000 Myasthenia gravis without (acute) exacerbation: Secondary | ICD-10-CM

## 2018-06-10 DIAGNOSIS — I25119 Atherosclerotic heart disease of native coronary artery with unspecified angina pectoris: Secondary | ICD-10-CM

## 2018-06-10 DIAGNOSIS — E1142 Type 2 diabetes mellitus with diabetic polyneuropathy: Secondary | ICD-10-CM | POA: Diagnosis not present

## 2018-06-10 DIAGNOSIS — I10 Essential (primary) hypertension: Secondary | ICD-10-CM

## 2018-06-10 DIAGNOSIS — Z794 Long term (current) use of insulin: Secondary | ICD-10-CM

## 2018-06-10 DIAGNOSIS — Z23 Encounter for immunization: Secondary | ICD-10-CM

## 2018-06-10 DIAGNOSIS — I5022 Chronic systolic (congestive) heart failure: Secondary | ICD-10-CM

## 2018-06-10 NOTE — Patient Instructions (Signed)
Call insurance and find out about shingrix coverage ( vaccine for shingles)

## 2018-06-10 NOTE — Progress Notes (Signed)
Name: [name]   MRN: [id number]    DOB: June 23, 1961   Date:06/10/2018       Progress Note  Subjective  Chief Complaint  Chief Complaint  Patient presents with  . Medication Refill  . Diabetes    managed by [name] - last A1C was 7.9 on 06/01/18  . Coronary Artery Disease  . Asthma  . Obesity  . MG  . low Vitamin D    done on  05/18/18 - results were 24.1    HPI  DMII : he has neuropathy, already on gabapentin given by [name]. HgbA1C went up to 7.9% done at Dr. Lenon Perry August 2019. Denies polyphagia, polydipsia or polyuria. Taking medication as prescribed . He is on Lantus, metformin and Victoza. FSBS at home has been going down to 60's about once a week and 80's about twice a week. He states [name] adjusted the dose of Lantus, he uses free style Elenor Legato and does not seem to be very accurate   CAD with angina/CHF/HTN:  multiple stents in the past, he had a Edgar stent placed July 2019, also diagnosed with CHF and is now on Entresto, Lasix , beta blocker and back on plavix and aspirin. Tolerating medication well. He states very seldom has dizziness when getting up. He is going to cardiac rehab.   Asthma: he sees pulmonologist , he states he continues to have a mild cough daily, wheezing also mild but daily, not coughing during the night. Since on Breo feels like symptoms of asthma have been better controlled.   Morbid obesity: based on co-morbidities. He is trying to lose weight, exercise two times a week for cardiac rehab and is also walking and using exercise bands at home, eating a 1800 calories diet  MG: seeing neurologist - [name].  He has ptosis of left eye but both sides at the end of the day. He has fatigue after activity. Stable on medications.  Inflammatory spondylopathy: continue follow up with pain clinic, pain is stable, has problems with gait, balance problems at times.   Patient Active Problem List   Diagnosis Date Noted  . Unstable angina  ([name]) 04/26/2018  . Ischemic cardiomyopathy 04/26/2018  . Chronic systolic CHF (congestive heart failure) ([name]) 04/26/2018  . Effort angina ([name]) 04/25/2018  . 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[name]) 01/25/2018  . Hyperlipidemia due to type 2 diabetes mellitus ([name]) 08/12/2017  . Vitamin D deficiency, unspecified 08/12/2017  . Ptosis of left eyelid 01/08/2017  . Dermatitis 12/24/2016  . Difficulty walking 04/27/2016  . Foot cramps 04/27/2016  . Morbid (severe) obesity due to excess calories ([place]) 04/06/2016  . Benign neoplasm of sigmoid colon   . Benign neoplasm of descending colon   . Benign neoplasm of transverse colon   . Coronary artery disease involving native coronary artery with angina pectoris ([place]) 10/22/2015  . Coronary artery disease 08/22/2015  . DM (diabetes mellitus), type 2, uncontrolled with complications ([place]) 68/09/7516  . Myasthenia gravis ([name]) 08/22/2015  . Chronic left-sided low back pain 08/22/2015  . Hypertension 08/22/2015  . GERD (gastroesophageal reflux disease) 08/22/2015  . Arthritis 08/22/2015  . Diabetic peripheral neuropathy ([name]) 08/22/2015  . Lyme disease 08/22/2015  . Hyperlipidemia 08/22/2015  . Insomnia 08/22/2015  . Type 2 diabetes mellitus with diabetic polyneuropathy, with long-term current use of insulin ([name]) 08/22/2015    Past Surgical History:  Procedure Laterality Date  . BILATERAL CARPAL TUNNEL RELEASE Bilateral L in 2012 and R in 2013  .  CARDIAC CATHETERIZATION     Several Caths, most recent in  March 2016.  [name] COLONOSCOPY WITH PROPOFOL N/A 01/10/2016   Procedure: COLONOSCOPY WITH PROPOFOL;  Surgeon: Edgar Lame, MD;  Location: [facility];  Service: Endoscopy;  Laterality: N/A;  . CORONARY ANGIOPLASTY    . CORONARY STENT INTERVENTION N/A 04/25/2018   Procedure: CORONARY STENT INTERVENTION;  Surgeon: Edgar Hampshire, MD;  Location: [place] [facility];  Service: Cardiovascular;  Laterality: N/A;  .  ESOPHAGOGASTRODUODENOSCOPY (EGD) WITH PROPOFOL N/A 01/10/2016   Procedure: ESOPHAGOGASTRODUODENOSCOPY (EGD) WITH PROPOFOL;  Surgeon: Edgar Lame, MD;  Location: [facility];  Service: Endoscopy;  Laterality: N/A;  . EYE SURGERY Bilateral 2012   cataract/bilateral vitrectomies  . LEFT HEART CATH AND CORONARY ANGIOGRAPHY Left 04/25/2018   Procedure: LEFT HEART CATH AND CORONARY ANGIOGRAPHY;  Surgeon: Edgar Hampshire, MD;  Location: [place] [facility];  Service: Cardiovascular;  Laterality: Left;  . TONSILLECTOMY AND ADENOIDECTOMY     As a child  . TUNNELED VENOUS CATHETER PLACEMENT     removed    Family History  Problem Relation Age of Onset  . Diabetes Mother   . Heart disease Mother   . Cancer Father        Prostate CA  . Diabetes Brother   . Healthy Brother   . Healthy Brother     Social History   Socioeconomic History  . Marital status: Married    Spouse name: [name]  . Number of children: 0  . Years of education: Not on file  . Highest education level: Master's degree (e.g., MA, MS, MEng, MEd, MSW, MBA)  Occupational History  . Occupation: disabled    Comment: multiple medical problems - first approved for uncontrolled DM, but now has heart disease and Myasthenia Gravis   Social Needs  . Financial resource strain: Not hard at all  . Food insecurity:    Worry: Never true    Inability: Never true  . Transportation needs:    Medical: No    Non-medical: No  Tobacco Use  . Smoking status: Never Smoker  . Smokeless tobacco: Never Used  . Tobacco comment: smoking cessation materials not required  Substance and Sexual Activity  . Alcohol use: Not Currently    Alcohol/week: 0.0 standard drinks  . Drug use: No  . Sexual activity: Not Currently  Lifestyle  . Physical activity:    Days per week: 7 days    Minutes per session: 30 min  . Stress: Not at all  Relationships  . Social connections:    Talks on phone: More than three times a week    Gets together: Three  times a week    Attends religious service: More than 4 times per year    Active member of club or organization: Yes    Attends meetings of clubs or organizations: More than 4 times per year    Relationship status: Married  . Intimate partner violence:    Fear of current or ex partner: No    Emotionally abused: No    Physically abused: No    Forced sexual activity: No  Other Topics Concern  . Not on file  Social History Narrative  . Not on file     Current Outpatient Medications:  .  albuterol (PROVENTIL HFA;VENTOLIN HFA) 108 (90 Base) MCG/ACT inhaler, Inhale 2 puffs into the lungs every 6 (six) hours as needed for wheezing or shortness of breath., Disp: , Rfl:  .  albuterol (PROVENTIL) (2.5 MG/3ML)  0.083% nebulizer solution, Take 2.5 mg by nebulization every 6 (six) hours as needed for wheezing or shortness of breath., Disp: , Rfl:  .  aspirin EC 81 MG tablet, Take 1 tablet (81 mg total) by mouth daily., Disp: 90 tablet, Rfl: 3 .  BREO ELLIPTA 200-25 MCG/INH AEPB, INHALE 1 PUFF INTO THE LUNGS DAILY, Disp: 1 each, Rfl: 3 .  carvedilol (COREG) 6.25 MG tablet, Take 1 tablet (6.25 mg total) by mouth 2 (two) times daily., Disp: 180 tablet, Rfl: 3 .  clopidogrel (PLAVIX) 75 MG tablet, Take 1 tablet (75 mg total) by mouth daily with breakfast., Disp: 30 tablet, Rfl: 6 .  cyanocobalamin 1000 MCG tablet, Take 1,000 mcg by mouth daily. am, Disp: , Rfl:  .  cyclobenzaprine (FLEXERIL) 10 MG tablet, Take 1 tablet (10 mg total) by mouth 2 (two) times daily., Disp: 60 tablet, Rfl: 5 .  dexlansoprazole (DEXILANT) 60 MG capsule, Take 1 capsule (60 mg total) by mouth daily., Disp: 30 capsule, Rfl: 6 .  ezetimibe (ZETIA) 10 MG tablet, Take 1 tablet (10 mg total) by mouth daily., Disp: 90 tablet, Rfl: 3 .  furosemide (LASIX) 40 MG tablet, Take 0.5 tablets (20 mg total) by mouth daily., Disp: 30 tablet, Rfl: 6 .  gabapentin (NEURONTIN) 300 MG capsule, Take 1 capsule (300 mg total) by mouth 3 (three) times  daily., Disp: 90 capsule, Rfl: 1 .  gabapentin (NEURONTIN) 800 MG tablet, Take 1 tablet (800 mg total) by mouth at bedtime., Disp: 90 tablet, Rfl: 0 .  insulin lispro (HUMALOG KWIKPEN) 100 UNIT/ML KiwkPen, Inject 7-8 Units into the skin 3 (three) times daily. Reported on 12/02/2015/ sliding scale 1 unit for every 8 units of carbs; and 1 unit for every 20 above 120, Disp: , Rfl:  .  Insulin Pen Needle (FIFTY50 PEN NEEDLES) 32G X 4 MM MISC, Inject 1 each into the skin as needed., Disp: , Rfl:  .  LANTUS SOLOSTAR 100 UNIT/ML Solostar Pen, Inject 33-35 Units into the skin daily at 10 pm., Disp: , Rfl: 0 .  levocetirizine (XYZAL) 5 MG tablet, Take 1 tablet (5 mg total) by mouth every evening., Disp: 30 tablet, Rfl: 3 .  liraglutide (VICTOZA) 18 MG/3ML SOPN, 1.8 units every morning, Disp: , Rfl:  .  loratadine (CLARITIN) 10 MG tablet, Take 10 mg by mouth daily. am, Disp: , Rfl:  .  metFORMIN (GLUCOPHAGE) 1000 MG tablet, Take 1,000 mg by mouth 2 (two) times daily with a meal., Disp: , Rfl:  .  montelukast (SINGULAIR) 10 MG tablet, Take 10 mg by mouth at bedtime., Disp: , Rfl: 1 .  Oxycodone HCl 10 MG TABS, Take 1 tablet (10 mg total) by mouth 4 (four) times daily as needed., Disp: 120 tablet, Rfl: 0 .  pyridostigmine (MESTINON) 60 MG tablet, Take 60 mg by mouth 4 (four) times daily., Disp: , Rfl:  .  rosuvastatin (CRESTOR) 5 MG tablet, Take 5 mg by mouth daily., Disp: , Rfl: 0 .  sacubitril-valsartan (ENTRESTO) 24-26 MG, Take 1 tablet by mouth 2 (two) times daily., Disp: 60 tablet, Rfl: 6 .  naloxone (NARCAN) nasal spray 4 mg/0.1 mL, For excess sedation from opioids, Disp: 1 kit, Rfl: 2 .  nitroGLYCERIN (NITROSTAT) 0.3 MG SL tablet, Place 1 tablet (0.3 mg total) under the tongue every 5 (five) minutes as needed for chest pain. Reported on 12/02/2015, Disp: 25 tablet, Rfl: 3 .  triamcinolone cream (KENALOG) 0.1 %, apply to affected area twice a day UNTIL  CLEAR as needed, Disp: , Rfl: 0  Allergies  Allergen  Reactions  . Metoprolol Rash  . Novolog [Insulin Aspart] Hives     ROS  Constitutional: Negative for fever or weight change.  Respiratory: positive  for cough and shortness of breath.   Cardiovascular: Negative for chest pain or palpitations.  Gastrointestinal: Negative for abdominal pain, no bowel changes.  Musculoskeletal: Positive  for gait problem ( has neuropathy and chronic back pain that affects his balance) but no  joint swelling.  Skin: Negative for rash.  Neurological: Negative for dizziness or headache.  No other specific complaints in a complete review of systems (except as listed in HPI above).   Objective  Vitals:   06/10/18 0920  BP: (!) 106/58  Pulse: 75  Resp: 16  Temp: 98.2 F (36.8 C)  TempSrc: Oral  SpO2: 95%  Weight: 233 lb 6.4 oz (105.9 kg)  Height: '5\' 7"'$  (1.702 m)    Body mass index is 36.56 kg/m.  Physical Exam  Constitutional: Patient appears well-developed and well-nourished. Obese  No distress.  HEENT: head atraumatic, normocephalic, pupils equal and reactive to light, left ptosis , neck supple, throat within normal limits Cardiovascular: Normal rate, regular rhythm and normal heart sounds.  No murmur heard. No BLE edema. Pulmonary/Chest: Effort normal and breath sounds normal. No respiratory distress. Abdominal: Soft.  There is no tenderness. Psychiatric: Patient has a normal mood and affect. behavior is normal. Judgment and thought content normal.  Recent Results (from the past 2160 hour(s))  CBC     Status: Abnormal   Collection Time: 04/12/18 11:10 AM  Result Value Ref Range   WBC 6.2 3.8 - 10.6 K/uL   RBC 4.41 4.40 - 5.90 MIL/uL   Hemoglobin 12.7 (L) 13.0 - 18.0 g/dL   HCT 37.8 (L) 40.0 - 52.0 %   MCV 85.7 80.0 - 100.0 fL   MCH 28.7 26.0 - 34.0 pg   MCHC 33.5 32.0 - 36.0 g/dL   RDW 14.0 11.5 - 14.5 %   Platelets 186 150 - 440 K/uL    Comment: Performed at [facility], [address] [place], [place] [address]   Basic metabolic panel     Status: Abnormal   Collection Time: 04/12/18 11:10 AM  Result Value Ref Range   Sodium 143 135 - 145 mmol/L   Potassium 5.2 (H) 3.5 - 5.1 mmol/L   Chloride 108 98 - 111 mmol/L    Comment: Please note change in reference range.   CO2 29 22 - 32 mmol/L   Glucose, Bld 154 (H) 70 - 99 mg/dL    Comment: Please note change in reference range.   BUN 20 6 - 20 mg/dL    Comment: Please note change in reference range.   Creatinine, Ser 1.18 0.61 - 1.24 mg/dL   Calcium 9.9 8.9 - 10.3 mg/dL   GFR calc non Af Amer >60 >60 mL/min   GFR calc Af Amer >60 >60 mL/min    Comment: (NOTE) The eGFR has been calculated using the CKD EPI equation. This calculation has not been validated in all clinical situations. eGFR's persistently <60 mL/min signify possible Chronic Kidney Disease.    Anion gap 6 5 - 15    Comment: Performed at [facility], [address] [place],  [address]  Glucose, capillary     Status: Abnormal   Collection Time: 04/25/18  8:39 AM  Result Value Ref Range   Glucose-Capillary 170 (H) 70 - 99  mg/dL  POCT Activated clotting time     Status: None   Collection Time: 04/25/18 11:28 AM  Result Value Ref Range   Activated Clotting Time 296 seconds  Glucose, capillary     Status: Abnormal   Collection Time: 04/25/18  3:19 PM  Result Value Ref Range   Glucose-Capillary 201 (H) 70 - 99 mg/dL  Glucose, capillary     Status: Abnormal   Collection Time: 04/25/18  9:16 PM  Result Value Ref Range   Glucose-Capillary 273 (H) 70 - 99 mg/dL  Basic metabolic panel     Status: Abnormal   Collection Time: 04/26/18  4:52 AM  Result Value Ref Range   Sodium 137 135 - 145 mmol/L   Potassium 4.2 3.5 - 5.1 mmol/L   Chloride 103 98 - 111 mmol/L   CO2 29 22 - 32 mmol/L   Glucose, Bld 214 (H) 70 - 99 mg/dL   BUN 19 6 - 20 mg/dL   Creatinine, Ser 1.14 0.61 - 1.24 mg/dL   Calcium 8.9 8.9 - 10.3 mg/dL   GFR calc non Af Amer >60 >60 mL/min   GFR calc Af  Amer >60 >60 mL/min    Comment: (NOTE) The eGFR has been calculated using the CKD EPI equation. This calculation has not been validated in all clinical situations. eGFR's persistently <60 mL/min signify possible Chronic Kidney Disease.    Anion gap 5 5 - 15    Comment: Performed at [facility], [address] [place], [place] [address]  CBC     Status: Abnormal   Collection Time: 04/26/18  4:52 AM  Result Value Ref Range   WBC 7.1 3.8 - 10.6 K/uL   RBC 4.27 (L) 4.40 - 5.90 MIL/uL   Hemoglobin 12.3 (L) 13.0 - 18.0 g/dL   HCT 36.5 (L) 40.0 - 52.0 %   MCV 85.4 80.0 - 100.0 fL   MCH 28.8 26.0 - 34.0 pg   MCHC 33.7 32.0 - 36.0 g/dL   RDW 14.0 11.5 - 14.5 %   Platelets 182 150 - 440 K/uL    Comment: Performed at [facility], [address] [place], [place] [address]  Glucose, capillary     Status: Abnormal   Collection Time: 04/26/18  8:57 AM  Result Value Ref Range   Glucose-Capillary 166 (H) 70 - 99 mg/dL  Basic Metabolic Panel (BMET)     Status: Abnormal   Collection Time: 04/28/18 10:46 AM  Result Value Ref Range   Sodium 135 135 - 145 mmol/L   Potassium 4.2 3.5 - 5.1 mmol/L   Chloride 97 (L) 98 - 111 mmol/L   CO2 29 22 - 32 mmol/L   Glucose, Bld 292 (H) 70 - 99 mg/dL   BUN 24 (H) 6 - 20 mg/dL   Creatinine, Ser 1.56 (H) 0.61 - 1.24 mg/dL   Calcium 8.6 (L) 8.9 - 10.3 mg/dL   GFR calc non Af Amer 47 (L) >60 mL/min   GFR calc Af Amer 55 (L) >60 mL/min    Comment: (NOTE) The eGFR has been calculated using the CKD EPI equation. This calculation has not been validated in all clinical situations. eGFR's persistently <60 mL/min signify possible Chronic Kidney Disease.    Anion gap 9 5 - 15    Comment: Performed at [facility], [address] [place], [place] [address]  Basic metabolic panel     Status: Abnormal   Collection Time: 05/06/18  9:24 AM  Result Value  Ref Range   Sodium 141 135 - 145 mmol/L   Potassium 4.8 3.5 - 5.1 mmol/L    Chloride 103 98 - 111 mmol/L   CO2 29 22 - 32 mmol/L   Glucose, Bld 143 (H) 70 - 99 mg/dL   BUN 31 (H) 6 - 20 mg/dL   Creatinine, Ser 1.25 (H) 0.61 - 1.24 mg/dL   Calcium 9.3 8.9 - 10.3 mg/dL   GFR calc non Af Amer >60 >60 mL/min   GFR calc Af Amer >60 >60 mL/min    Comment: (NOTE) The eGFR has been calculated using the CKD EPI equation. This calculation has not been validated in all clinical situations. eGFR's persistently <60 mL/min signify possible Chronic Kidney Disease.    Anion gap 9 5 - 15    Comment: Performed at [facility], [address] [place], [place] [address]  HM DIABETES EYE EXAM     Status: Abnormal   Collection Time: 05/16/18 12:00 AM  Result Value Ref Range   HM Diabetic Eye Exam Retinopathy (A) No Retinopathy    Comment: proliferative - both stable ([facility]) Dr. Isaias Sakai  Glucose, capillary     Status: Abnormal   Collection Time: 05/19/18 10:04 AM  Result Value Ref Range   Glucose-Capillary 214 (H) 70 - 99 mg/dL  Glucose, capillary     Status: Abnormal   Collection Time: 05/19/18 11:08 AM  Result Value Ref Range   Glucose-Capillary 212 (H) 70 - 99 mg/dL  Glucose, capillary     Status: Abnormal   Collection Time: 05/24/18  9:50 AM  Result Value Ref Range   Glucose-Capillary 294 (H) 70 - 99 mg/dL  Glucose, capillary     Status: Abnormal   Collection Time: 05/24/18 11:07 AM  Result Value Ref Range   Glucose-Capillary 228 (H) 70 - 99 mg/dL  Glucose, capillary     Status: Abnormal   Collection Time: 05/25/18  7:30 AM  Result Value Ref Range   Glucose-Capillary 148 (H) 70 - 99 mg/dL  Glucose, capillary     Status: Abnormal   Collection Time: 05/25/18  8:40 AM  Result Value Ref Range   Glucose-Capillary 154 (H) 70 - 99 mg/dL     PHQ2/9: Depression screen [facility] 2/9 06/10/2018 05/12/2018 05/06/2018 03/17/2018 02/25/2018  Decreased Interest 0 - 0 0 0  Down, Depressed, Hopeless 0 - 0 0 0  PHQ - 2 Score 0 - 0 0 0  Altered sleeping 0 1 - - 0   Tired, decreased energy 0 2 - - 0  Change in appetite 0 0 - - 0  Feeling bad or failure about yourself  0 0 - - 0  Trouble concentrating 0 0 - - 0  Moving slowly or fidgety/restless 0 0 - - 0  Suicidal thoughts 0 0 - - 0  PHQ-9 Score 0 - - - 0  Difficult doing work/chores Not difficult at all Not difficult at all - - Not difficult at all  Some recent data might be hidden     Fall Risk: Fall Risk  06/10/2018 05/12/2018 05/09/2018 05/06/2018 03/17/2018  Falls in the past year? No No No No No  Comment - - - - -  Number falls in past yr: - - - - -  Comment - - - - -  Injury with Fall? - - - - -  Risk Factor Category  - - - - -  Risk for fall due to : Impaired balance/gait - - - -  Risk for fall due to: Comment - - - - -  Follow up - - - - -      Functional Status Survey: Is the patient deaf or have difficulty hearing?: No Does the patient have difficulty seeing, even when wearing glasses/contacts?: No Does the patient have difficulty concentrating, remembering, or making decisions?: No Does the patient have difficulty walking or climbing stairs?: No Does the patient have difficulty dressing or bathing?: No Does the patient have difficulty doing errands alone such as visiting a doctor's office or shopping?: No    Assessment & Plan  1. Asthma, moderate persistent  Under the care of Dr. Raul Del   2. Need for immunization against influenza  - Flu Vaccine QUAD 6+ mos PF IM (Fluarix Quad PF)  3. Type 2 diabetes mellitus with diabetic polyneuropathy, with long-term current use of insulin (HCC)  Seeing [name]   4. Inflammatory spondylopathy of lumbosacral region [facility])  Seeing Dr. Baruch Merl  5. Morbid obesity ([place])  Discussed with the patient the risk posed by an increased BMI. Discussed importance of portion control, calorie counting and at least 150 minutes of physical activity weekly. Avoid sweet beverages and drink more water. Eat at least 6 servings of fruit and  vegetables daily   6. Essential hypertension  bp is low but tolerating it well, taking medication for CAD and CHF  7. Coronary artery disease involving native coronary artery of native heart with angina pectoris (HCC)  On plavix and statin therapy also on Zetia   8. Myasthenia gravis ([place])  Under the care of [name]   9. Chronic systolic CHF (congestive heart failure) (HCC)  On Entresto now

## 2018-06-12 NOTE — Progress Notes (Deleted)
Patient ID: [name], male    DOB: [birth year], 57 y.o.   MRN: [id number]  HPI  [name] is a 57 y/o male with a history of asthma, CAD, DM, hyperlipidemia, HTN,GERD, lyme disease, myasthenia gravis, MI, obstructive sleep apnea, multiple stents and chronic heart failure.   Echo report from 03/31/18 reviewed and showed an EF of 30-35% along with mild Edgar.   Left heart catheterization done 04/25/18 which showed an EF of 25-35% along with severely elevated left ventricular end-diastolic pressure. DES placed in mid left circumflex.   Admitted 04/25/18 for above catheterization, otherwise, no admissions or ED visits in the last 6 months.  He presents today for a follow-up visit with a chief complaint of   Past Medical History:  Diagnosis Date  . Allergy    dust, seasonal (worse in the fall).  . 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 Followed by Pain Specialist in [place], back and neck  . Asthma    BRONCHITIS  . Cataract    First Dx in 2012  . CHF (congestive heart failure) ([place])   . Coronary artery disease    a. Prior Ant MI->s/p multiple stents placed in the LAD and right coronary artery ([place]); b. 2016 Cath: reportedly nonobs dzs;  c. 02/2017 MV: EF 45-54%, ap/ant, ap/inf, apical infarct, no ishcemia; c. 04/2018 Cath/PCI: LM nl, LAD 20p, patent mid stent, LCX 82m(3.25x15 [place] DES), OM1 nl, OM2 50, OM3 40 w/ patent stent, RCA 40p, 68m, 40d w/ patent stent in RPDA, RPAV 60, EF 25-35%. 2+Edgar.  . Diabetes mellitus without complication ([place])    TYPE 2  . Diabetic peripheral neuropathy (HCC)    feet and hands  . GERD (gastroesophageal reflux disease)   . Headache    muscle tension  . Hyperlipidemia   . Hypertension    CONTROLLED ON MEDS  . Insomnia   . Ischemic cardiomyopathy    a. 03/2018 Echo: EF 30-35%, ant, antlat, apical AK, Gr1 DD, mild Edgar, mildly dil LA.  [name] Lyme disease    Chronic  . Myasthenia gravis ([place])   . Myocardial infarction ([place]) 2010  . Seasonal allergies   . Sleep  apnea    CPAP   Past Surgical History:  Procedure Laterality Date  . BILATERAL CARPAL TUNNEL RELEASE Bilateral L in 2012 and R in 2013  . CARDIAC CATHETERIZATION     Several Caths, most recent in  March 2016.  [name] COLONOSCOPY WITH PROPOFOL N/A 01/10/2016   Procedure: COLONOSCOPY WITH PROPOFOL;  Surgeon: [name], MD;  Location: [facility];  Service: Endoscopy;  Laterality: N/A;  . CORONARY ANGIOPLASTY    . CORONARY STENT INTERVENTION N/A 04/25/2018   Procedure: CORONARY STENT INTERVENTION;  Surgeon: [name], MD;  Location: [facility];  Service: Cardiovascular;  Laterality: N/A;  . ESOPHAGOGASTRODUODENOSCOPY (EGD) WITH PROPOFOL N/A 01/10/2016   Procedure: ESOPHAGOGASTRODUODENOSCOPY (EGD) WITH PROPOFOL;  Surgeon: [name], MD;  Location: [facility];  Service: Endoscopy;  Laterality: N/A;  . EYE SURGERY Bilateral 2012   cataract/bilateral vitrectomies  . LEFT HEART CATH AND CORONARY ANGIOGRAPHY Left 04/25/2018   Procedure: LEFT HEART CATH AND CORONARY ANGIOGRAPHY;  Surgeon: [name], MD;  Location: [facility];  Service: Cardiovascular;  Laterality: Left;  . TONSILLECTOMY AND ADENOIDECTOMY     As a child  . TUNNELED VENOUS CATHETER PLACEMENT     removed   Family History  Problem Relation Age of Onset  . Diabetes Mother   .  Heart disease Mother   . Cancer Father        Prostate CA  . Diabetes Brother   . Healthy Brother   . Healthy Brother    Social History   Tobacco Use  . Smoking status: Never Smoker  . Smokeless tobacco: Never Used  . Tobacco comment: smoking cessation materials not required  Substance Use Topics  . Alcohol use: Not Currently    Alcohol/week: 0.0 standard drinks   Allergies  Allergen Reactions  . Metoprolol Rash  . Novolog [Insulin Aspart] Hives     Review of Systems  Constitutional: Positive for fatigue (easily). Negative for appetite change.  HENT: Negative for congestion, postnasal drip and sore  throat.   Eyes: Negative.   Respiratory: Positive for shortness of breath. Negative for cough.   Cardiovascular: Negative for chest pain, palpitations and leg swelling.  Gastrointestinal: Negative for abdominal distention and abdominal pain.  Endocrine: Negative.   Genitourinary: Negative.   Musculoskeletal: Positive for back pain (lower left part). Negative for neck pain.  Skin: Negative.   Allergic/Immunologic: Negative.   Neurological: Positive for dizziness (2 days ago). Negative for light-headedness.  Hematological: Negative for adenopathy. Does not bruise/bleed easily.  Psychiatric/Behavioral: Negative for dysphoric mood and sleep disturbance (sleeping on 2 pillows). The patient is not nervous/anxious.       Physical Exam  Constitutional: He is oriented to person, place, and time. He appears well-developed and well-nourished.  HENT:  Head: Normocephalic and atraumatic.  Neck: Normal range of motion. Neck supple. No JVD present.  Cardiovascular: Regular rhythm. Tachycardia present.  Pulmonary/Chest: Effort normal. No respiratory distress. He has no wheezes.  Abdominal: Soft. He exhibits distension.  Musculoskeletal: He exhibits no edema or tenderness.  Neurological: He is alert and oriented to person, place, and time.  Skin: Skin is warm and dry.  Psychiatric: He has a normal mood and affect. His behavior is normal. Thought content normal.  Nursing note and vitals reviewed.   Assessment & Plan:  1: Chronic heart failure with reduced ejection fraction- - NYHA class III - euvolemic today - weighing daily and says that his weight has been stable. Reminded to call for an overnight weight gain of >2 pounds or a weekly weight gain of >5 pounds - weight  - not adding salt to his food and has been reading food labels for sodium content. Reviewed the importance of keeping daily sodium intake to 2000mg   - tachycardic today so discussed that we may need to try to titrate up  carvedilol if HR remains elevated; if unable to titrate, could consider corlanor - BP today limits titration of entresto - saw cardiology Fletcher Anon) 05/12/18 - saw pulmonology Raul Del) 05/20/18 - participating in cardiac rehab   2: HTN- - BP  - saw PCP Ancil Boozer) 06/10/18 - BMP 05/18/18 reviewed and showed sodium 143, potassium 4.5, creatinine 1.4 and GFR 52  3: DM- - fasting glucose at home today was  - saw endocrinology Honor Junes) 06/01/18 - A1c 06/01/18 was 7.9%  Medication list was reviewed.

## 2018-06-13 ENCOUNTER — Ambulatory Visit: Payer: Medicare Other | Admitting: Family

## 2018-06-15 ENCOUNTER — Telehealth: Payer: Self-pay | Admitting: Cardiovascular Disease

## 2018-06-15 ENCOUNTER — Encounter: Payer: Self-pay | Admitting: *Deleted

## 2018-06-15 DIAGNOSIS — E875 Hyperkalemia: Secondary | ICD-10-CM

## 2018-06-15 DIAGNOSIS — R000 Tachycardia, unspecified: Secondary | ICD-10-CM

## 2018-06-15 DIAGNOSIS — Z955 Presence of coronary angioplasty implant and graft: Secondary | ICD-10-CM

## 2018-06-15 DIAGNOSIS — R42 Dizziness and giddiness: Secondary | ICD-10-CM

## 2018-06-15 NOTE — Progress Notes (Signed)
Cardiac Individual Treatment Plan  Patient Details  Name: [name] MRN: [id number] Date of Birth: 1960/12/19 Referring Provider:     Cardiac Rehab from 05/12/2018 in [facility] Cardiac and Pulmonary Rehab  Referring Provider  Arida      Initial Encounter Date:    Cardiac Rehab from 05/12/2018 in [facility] Cardiac and Pulmonary Rehab  Date  05/12/18      Visit Diagnosis: Status post coronary artery stent placement  Patient's Home Medications on Admission:  Current Outpatient Medications:  .  albuterol (PROVENTIL HFA;VENTOLIN HFA) 108 (90 Base) MCG/ACT inhaler, Inhale 2 puffs into the lungs every 6 (six) hours as needed for wheezing or shortness of breath., Disp: , Rfl:  .  albuterol (PROVENTIL) (2.5 MG/3ML) 0.083% nebulizer solution, Take 2.5 mg by nebulization every 6 (six) hours as needed for wheezing or shortness of breath., Disp: , Rfl:  .  aspirin EC 81 MG tablet, Take 1 tablet (81 mg total) by mouth daily., Disp: 90 tablet, Rfl: 3 .  BREO ELLIPTA 200-25 MCG/INH AEPB, INHALE 1 PUFF INTO THE LUNGS DAILY, Disp: 1 each, Rfl: 3 .  carvedilol (COREG) 6.25 MG tablet, Take 1 tablet (6.25 mg total) by mouth 2 (two) times daily., Disp: 180 tablet, Rfl: 3 .  clopidogrel (PLAVIX) 75 MG tablet, Take 1 tablet (75 mg total) by mouth daily with breakfast., Disp: 30 tablet, Rfl: 6 .  cyanocobalamin 1000 MCG tablet, Take 1,000 mcg by mouth daily. am, Disp: , Rfl:  .  cyclobenzaprine (FLEXERIL) 10 MG tablet, Take 1 tablet (10 mg total) by mouth 2 (two) times daily., Disp: 60 tablet, Rfl: 5 .  dexlansoprazole (DEXILANT) 60 MG capsule, Take 1 capsule (60 mg total) by mouth daily., Disp: 30 capsule, Rfl: 6 .  ezetimibe (ZETIA) 10 MG tablet, Take 1 tablet (10 mg total) by mouth daily., Disp: 90 tablet, Rfl: 3 .  furosemide (LASIX) 40 MG tablet, Take 0.5 tablets (20 mg total) by mouth daily., Disp: 30 tablet, Rfl: 6 .  gabapentin (NEURONTIN) 300 MG capsule, Take 1 capsule (300 mg total) by mouth 3 (three)  times daily., Disp: 90 capsule, Rfl: 1 .  gabapentin (NEURONTIN) 800 MG tablet, Take 1 tablet (800 mg total) by mouth at bedtime., Disp: 90 tablet, Rfl: 0 .  insulin lispro (HUMALOG KWIKPEN) 100 UNIT/ML KiwkPen, Inject 7-8 Units into the skin 3 (three) times daily. Reported on 12/02/2015/ sliding scale 1 unit for every 8 units of carbs; and 1 unit for every 20 above 120, Disp: , Rfl:  .  Insulin Pen Needle (FIFTY50 PEN NEEDLES) 32G X 4 MM MISC, Inject 1 each into the skin as needed., Disp: , Rfl:  .  LANTUS SOLOSTAR 100 UNIT/ML Solostar Pen, Inject 33-35 Units into the skin daily at 10 pm., Disp: , Rfl: 0 .  levocetirizine (XYZAL) 5 MG tablet, Take 1 tablet (5 mg total) by mouth every evening., Disp: 30 tablet, Rfl: 3 .  liraglutide (VICTOZA) 18 MG/3ML SOPN, 1.8 units every morning, Disp: , Rfl:  .  loratadine (CLARITIN) 10 MG tablet, Take 10 mg by mouth daily. am, Disp: , Rfl:  .  metFORMIN (GLUCOPHAGE) 1000 MG tablet, Take 1,000 mg by mouth 2 (two) times daily with a meal., Disp: , Rfl:  .  montelukast (SINGULAIR) 10 MG tablet, Take 10 mg by mouth at bedtime., Disp: , Rfl: 1 .  naloxone (NARCAN) nasal spray 4 mg/0.1 mL, For excess sedation from opioids, Disp: 1 kit, Rfl: 2 .  nitroGLYCERIN (NITROSTAT) 0.3  MG SL tablet, Place 1 tablet (0.3 mg total) under the tongue every 5 (five) minutes as needed for chest pain. Reported on 12/02/2015, Disp: 25 tablet, Rfl: 3 .  Oxycodone HCl 10 MG TABS, Take 1 tablet (10 mg total) by mouth 4 (four) times daily as needed., Disp: 120 tablet, Rfl: 0 .  pyridostigmine (MESTINON) 60 MG tablet, Take 60 mg by mouth 4 (four) times daily., Disp: , Rfl:  .  rosuvastatin (CRESTOR) 5 MG tablet, Take 5 mg by mouth daily., Disp: , Rfl: 0 .  sacubitril-valsartan (ENTRESTO) 24-26 MG, Take 1 tablet by mouth 2 (two) times daily., Disp: 60 tablet, Rfl: 6 .  triamcinolone cream (KENALOG) 0.1 %, apply to affected area twice a day UNTIL CLEAR as needed, Disp: , Rfl: 0  Past Medical  History: Past Medical History:  Diagnosis Date  . Allergy    dust, seasonal (worse in the fall).  . 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 Followed by Pain Specialist in [place], back and neck  . Asthma    BRONCHITIS  . Cataract    First Dx in 2012  . CHF (congestive heart failure) (Newborn)   . Coronary artery disease    a. Prior Ant MI->s/p multiple stents placed in the LAD and right coronary artery ([place]); b. 2016 Cath: reportedly nonobs dzs;  c. 02/2017 MV: EF 45-54%, ap/ant, ap/inf, apical infarct, no ishcemia; c. 04/2018 Cath/PCI: LM nl, LAD 20p, patent mid stent, LCX [id number] Sierra DES), OM1 nl, OM2 50, OM3 40 w/ patent stent, RCA 40p, 264m40d w/ patent stent in RPDA, RPAV 60, EF 25-35%. 2+MR.  . Diabetes mellitus without complication ([place]   TYPE 2  . Diabetic peripheral neuropathy (HCC)    feet and hands  . GERD (gastroesophageal reflux disease)   . Headache    muscle tension  . Hyperlipidemia   . Hypertension    CONTROLLED ON MEDS  . Insomnia   . Ischemic cardiomyopathy    a. 03/2018 Echo: EF 30-35%, ant, antlat, apical AK, Gr1 DD, mild MR, mildly dil LA.  . Marland Kitchenyme disease    Chronic  . Myasthenia gravis ([place]  . [place]  . Seasonal allergies   . Sleep apnea    CPAP    Tobacco Use: Social History   Tobacco Use  Smoking Status Never Smoker  Smokeless Tobacco Never Used  Tobacco Comment   smoking cessation materials not required    Labs: Recent Review Flowsheet Data    Labs for ITP Cardiac and Pulmonary Rehab Latest Ref Rng & Units 11/29/2017 01/17/2018 01/25/2018 05/18/2018 06/01/2018   Cholestrol 0 - 200 141 113 - 109 -   LDLCALC - 77 62 - 50 -   HDL 35 - 70 38(L) 33(L) - 37 -   Trlycerides 40 - 160 130 92 - 112 -   Hemoglobin A1c 4.0 - 5.6 % - - 7.2 7.9(A) 7.9(A)       Exercise Target Goals: Exercise Program Goal: Individual exercise prescription set using results from initial 6 min walk test and THRR while considering  patient's  activity barriers and safety.   Exercise Prescription Goal: Initial exercise prescription builds to 30-45 minutes a day of aerobic activity, 2-3 days per week.  Home exercise guidelines will be given to patient during program as part of exercise prescription that the participant will acknowledge.  Activity Barriers & Risk Stratification: Activity Barriers & Cardiac Risk Stratification - 05/12/18 1213  Activity Barriers & Cardiac Risk Stratification   Activity Barriers  Arthritis;Back Problems;Assistive Device;Neck/Spine Problems;Shortness of Breath;Muscular Weakness   Lower spine arthritis, does Steriod shots at pain clininc:unable at present because on Plavix. has mynesthenia gravis   Cardiac Risk Stratification  High       6 Minute Walk: 6 Minute Walk    Row Name 05/12/18 1353         6 Minute Walk   Distance  1224 feet     Walk Time  6 minutes     # of Rest Breaks  0     MPH  2.32     METS  3.08     RPE  12     Perceived Dyspnea   2     VO2 Peak  10.8     Symptoms  No     Resting HR  97 bpm     Resting BP  126/72     Resting Oxygen Saturation   92 %     Exercise Oxygen Saturation  during 6 min walk  87 %     Max Ex. HR  116 bpm     Max Ex. BP  126/64     2 Minute Post BP  120/64        Oxygen Initial Assessment:   Oxygen Re-Evaluation:   Oxygen Discharge (Final Oxygen Re-Evaluation):   Initial Exercise Prescription: Initial Exercise Prescription - 05/12/18 1300      Date of Initial Exercise RX and Referring Provider   Date  05/12/18    Referring Provider  Arida      Treadmill   MPH  2.3    Grade  1    Minutes  15    METs  3.08      Recumbant Bike   Level  5    RPM  60    Watts  35    Minutes  15    METs  3      T5 Nustep   Level  2    SPM  80    Minutes  15    METs  3      Prescription Details   Frequency (times per week)  3    Duration  Progress to 30 minutes of continuous aerobic without signs/symptoms of physical distress       Intensity   THRR 40-80% of Max Heartrate  123-149    Ratings of Perceived Exertion  11-13    Perceived Dyspnea  0-4      Resistance Training   Training Prescription  Yes    Weight  4 lb    Reps  10-15       Perform Capillary Blood Glucose checks as needed.  Exercise Prescription Changes: Exercise Prescription Changes    Row Name 05/24/18 1200 06/07/18 1600 06/09/18 0900         Response to Exercise   Blood Pressure (Admit)  126/68  102/58  -     Blood Pressure (Exercise)  154/62  116/54  -     Blood Pressure (Exit)  124/78  124/58  -     Heart Rate (Admit)  70 bpm  97 bpm  -     Heart Rate (Exercise)  130 bpm  1118 bpm  -     Heart Rate (Exit)  109 bpm  86 bpm  -     Rating of Perceived Exertion (Exercise)  12  13  -  Symptoms  none  none  -     Comments  first full week of exercise   -  -     Duration  Continue with 30 min of aerobic exercise without signs/symptoms of physical distress.  Continue with 30 min of aerobic exercise without signs/symptoms of physical distress.  -     Intensity  THRR unchanged  THRR unchanged  -       Progression   Progression  Continue to progress workloads to maintain intensity without signs/symptoms of physical distress.  Continue to progress workloads to maintain intensity without signs/symptoms of physical distress.  -     Average METs  2.4  2.93  -       Resistance Training   Training Prescription  Yes  Yes  -     Weight  3 lbs  4 lbs  -     Reps  10-15  10-15  -       Interval Training   Interval Training  -  No  -       Treadmill   MPH  -  2.5  -     Grade  -  1  -     Minutes  -  15  -     METs  -  3.26  -       Recumbant Bike   Level  5  5  -     Watts  35  25  -     Minutes  15  15  -     METs  3  2.72  -       T5 Nustep   Level  2  2  -     Minutes  15  15  -     METs  2.4  2.8  -       Home Exercise Plan   Plans to continue exercise at  -  -  Home (comment) walking, join [employer]     Frequency  -  -  Add 3  additional days to program exercise sessions.     Initial Home Exercises Provided  -  -  06/09/18        Exercise Comments: Exercise Comments    Row Name 05/19/18 0935           Exercise Comments  First full day of exercise!  Patient was oriented to gym and equipment including functions, settings, policies, and procedures.  Patient's individual exercise prescription and treatment plan were reviewed.  All starting workloads were established based on the results of the 6 minute walk test done at initial orientation visit.  The plan for exercise progression was also introduced and progression will be customized based on patient's performance and goals.          Exercise Goals and Review: Exercise Goals    Row Name 05/12/18 1352             Exercise Goals   Increase Physical Activity  Yes       Intervention  Provide advice, education, support and counseling about physical activity/exercise needs.;Develop an individualized exercise prescription for aerobic and resistive training based on initial evaluation findings, risk stratification, comorbidities and participant's personal goals.       Expected Outcomes  Short Term: Attend rehab on a regular basis to increase amount of physical activity.;Long Term: Add in home exercise to make exercise part of routine and to increase amount of physical activity.;Long  Term: Exercising regularly at least 3-5 days a week.       Increase Strength and Stamina  Yes       Intervention  Provide advice, education, support and counseling about physical activity/exercise needs.;Develop an individualized exercise prescription for aerobic and resistive training based on initial evaluation findings, risk stratification, comorbidities and participant's personal goals.       Expected Outcomes  Short Term: Increase workloads from initial exercise prescription for resistance, speed, and METs.;Short Term: Perform resistance training exercises routinely during rehab and add  in resistance training at home;Long Term: Improve cardiorespiratory fitness, muscular endurance and strength as measured by increased METs and functional capacity (6MWT)       Able to understand and use rate of perceived exertion (RPE) scale  Yes       Intervention  Provide education and explanation on how to use RPE scale       Expected Outcomes  Short Term: Able to use RPE daily in rehab to express subjective intensity level;Long Term:  Able to use RPE to guide intensity level when exercising independently       Able to understand and use Dyspnea scale  Yes       Intervention  Provide education and explanation on how to use Dyspnea scale       Expected Outcomes  Short Term: Able to use Dyspnea scale daily in rehab to express subjective sense of shortness of breath during exertion;Long Term: Able to use Dyspnea scale to guide intensity level when exercising independently       Knowledge and understanding of Target Heart Rate Range (THRR)  Yes       Intervention  Provide education and explanation of THRR including how the numbers were predicted and where they are located for reference       Expected Outcomes  Short Term: Able to state/look up THRR;Long Term: Able to use THRR to govern intensity when exercising independently;Short Term: Able to use daily as guideline for intensity in rehab       Able to check Perry independently  Yes       Intervention  Provide education and demonstration on how to check Perry in carotid and radial arteries.;Review the importance of being able to check your own Perry for safety during independent exercise       Expected Outcomes  Short Term: Able to explain why Perry checking is important during independent exercise;Long Term: Able to check Perry independently and accurately       Understanding of Exercise Prescription  Yes       Intervention  Provide education, explanation, and written materials on patient's individual exercise prescription       Expected Outcomes   Short Term: Able to explain program exercise prescription;Long Term: Able to explain home exercise prescription to exercise independently          Exercise Goals Re-Evaluation : Exercise Goals Re-Evaluation    Row Name 05/19/18 0935 05/24/18 1220 06/07/18 1004 06/09/18 0946       Exercise Goal Re-Evaluation   Exercise Goals Review  Increase Physical Activity;Increase Strength and Stamina;Able to understand and use rate of perceived exertion (RPE) scale;Understanding of Exercise Prescription  Increase Physical Activity;Increase Strength and Stamina;Able to understand and use rate of perceived exertion (RPE) scale;Understanding of Exercise Prescription  Increase Physical Activity;Increase Strength and Stamina;Understanding of Exercise Prescription  Increase Physical Activity;Increase Strength and Stamina;Understanding of Exercise Prescription;Able to understand and use rate of perceived exertion (RPE) scale;Knowledge and understanding of  Target Heart Rate Range (THRR);Able to check Perry independently    Comments  Reviewed RPE scale, THR and program prescription with pt today.  Pt voiced understanding and was given a copy of goals to take home.   [name] is doing well in rehab so far. He is tolerating exercise well. He is on level 5 on the recumbant bike and level 2 on the nu step. We will continue to monitor progess.   [name] is doing well in rehab.  He is exercising 6 days a week.  He is walking for 35 min at home and using exercise bands as well.  He is getting his strength and stamina back on his good days, but when his myesthina flares up.  His myesthina effects his diaphragm making it hard for him to breathe.   Reviewed home exercise with pt today.  Pt plans to continue walking for exercise.  He and his wife are also going to join the [facility].  Reviewed THR, Perry, RPE, sign and symptoms, NTG use, and when to call 911 or MD.  Also discussed weather considerations and indoor options.  Pt voiced understanding.     Expected Outcomes  Short: Use RPE daily to regulate intensity. Long: Follow program prescription in THR.  Short: increase strength and stamina Long: increase overall MET levels   Short: Continue to exercise on off days.  Long: Continue to increase strength and stamina.   Short: Continue to exercise on off days.  Long: Continue to increase strength and stamina.        Discharge Exercise Prescription (Final Exercise Prescription Changes): Exercise Prescription Changes - 06/09/18 0900      Home Exercise Plan   Plans to continue exercise at  Home (comment)   walking, join [employer]   Frequency  Add 3 additional days to program exercise sessions.    Initial Home Exercises Provided  06/09/18       Nutrition:  Target Goals: Understanding of nutrition guidelines, daily intake of sodium '1500mg'$ , cholesterol '200mg'$ , calories 30% from fat and 7% or less from saturated fats, daily to have 5 or more servings of fruits and vegetables.  Biometrics: Pre Biometrics - 05/12/18 1351      Pre Biometrics   Height  '5\' 8"'$  (1.727 m)    Weight  232 lb 3.2 oz (105.3 kg)    Waist Circumference  47 inches    Hip Circumference  45 inches    Waist to Hip Ratio  1.04 %    BMI (Calculated)  35.31    Single Leg Stand  1 seconds   neuropathy       Nutrition Therapy Plan and Nutrition Goals: Nutrition Therapy & Goals - 05/31/18 1029      Nutrition Therapy   Diet  DM    Protein (specify units)  12-13    Fiber  35 grams    Whole Grain Foods  3 servings   chooses whole grains regularly   Saturated Fats  15 max. grams    Fruits and Vegetables  5 servings/day   8 ideal; trying to eat more vegetables, does not always eat 3 meals per day   Sodium  1500 grams      Personal Nutrition Goals   Nutrition Goal  Consume a protein source with meals and snacks, at least 1oz/7g, along with at least 1 serving (15g) of carbohydrate to help better regulate blood sugars    Personal Goal #2  Include more sources of  non-starchy vegetables daily.  This also helps to increase daily fiber intake to aid in short term BG control    Personal Goal #3  Eat a mid day meal/ snack more consistently. Try not to go more than 4-5 hours without eating. You may also want to try a snack '@HS'$  to prevent low blood sugar readings upon waking in the morning    Comments  He has had diabetic education in the past and practices carb counting, but does not eat on a regular schedule most days and does not always practice eating balanced meals. He is limited to 64oz of fluid per day d/t CHF and c/o struggling to eat enough vegetables d/t disliking several varieties. He plans to try oatmeal for breakfast this week as a new heart-healthy option, as he tends to want to choose high-sugar cereals like [employer] Puffs      Intervention Plan   Intervention  Prescribe, educate and counsel regarding individualized specific dietary modifications aiming towards targeted core components such as weight, hypertension, lipid management, diabetes, heart failure and other comorbidities.    Expected Outcomes  Short Term Goal: Understand basic principles of dietary content, such as calories, fat, sodium, cholesterol and nutrients.;Short Term Goal: A plan has been developed with personal nutrition goals set during dietitian appointment.;Long Term Goal: Adherence to prescribed nutrition plan.       Nutrition Assessments: Nutrition Assessments - 05/12/18 1221      MEDFICTS Scores   Pre Score  55       Nutrition Goals Re-Evaluation: Nutrition Goals Re-Evaluation    [place] Name 05/31/18 1039 06/07/18 1009           Goals   Nutrition Goal  Eat a mid day meal/ snack more consistently. Try not to go more than 4-5 hours without eating. You may also want to try a snack '@HS'$  to prevent low blood sugar readings upon waking in the morning  Mid day meal/snack, non starch vegetables, more fiber, more protein      Comment  He is not consistent about eating a meal mid day.  He typically eats breakfast and dinner and does not usually eat an evening (HS) snack  [name] is eating that mid day meal/snack more consistently and has found that it is keeping his blood sugars more even throughout the day.   He has also branched out to non-starchy vegetables including cauliflower. [name] has added in more protein and using protein shakes for his extra meal.        Expected Outcome  He will be consistent about eating a small meal/ snack mid day to help improve blood glucose control over the course of the day. This meal will include at least 1 serving of protein + at least 1 serving of complex carbohydrate  Short: Stay consistent with three meals a day.  Long: Continue to add in the newer fooods.         Personal Goal #2 Re-Evaluation   Personal Goal #2  Include more sources of non-starchy vegetables daily. This also helps to increase daily fiber intake to aid in short-term BG control  -        Personal Goal #3 Re-Evaluation   Personal Goal #3  Consume a protein source with meals and snacks, at least 1oz/7g, along with at least 1 serving (15g) of carbohydrate to help better regulate blood sugars  -         Nutrition Goals Discharge (Final Nutrition Goals Re-Evaluation): Nutrition Goals Re-Evaluation - 06/07/18 1009  Goals   Nutrition Goal  Mid day meal/snack, non starch vegetables, more fiber, more protein    Comment  [name] is eating that mid day meal/snack more consistently and has found that it is keeping his blood sugars more even throughout the day.   He has also branched out to non-starchy vegetables including cauliflower. [name] has added in more protein and using protein shakes for his extra meal.      Expected Outcome  Short: Stay consistent with three meals a day.  Long: Continue to add in the newer fooods.        Psychosocial: Target Goals: Acknowledge presence or absence of significant depression and/or stress, maximize coping skills, provide positive support system.  Participant is able to verbalize types and ability to use techniques and skills needed for reducing stress and depression.   Initial Review & Psychosocial Screening: Initial Psych Review & Screening - 05/12/18 1216      Initial Review   Current issues with  Current Stress Concerns    Source of Stress Concerns  Chronic Illness;Unable to perform yard/household activities;Family    Comments  Household has changed: children moved back home. Good and bad stress with his. Son is helpful; new chaos in house.       Family Dynamics   Good Support System?  Yes   Wife, son,grandson, family friends     Barriers   Psychosocial barriers to participate in program  There are no identifiable barriers or psychosocial needs.;The patient should benefit from training in stress management and relaxation.      Screening Interventions   Interventions  Encouraged to exercise;To provide support and resources with identified psychosocial needs;Provide feedback about the scores to participant    Expected Outcomes  Short Term goal: Utilizing psychosocial counselor, staff and physician to assist with identification of specific Stressors or current issues interfering with healing process. Setting desired goal for each stressor or current issue identified.;Long Term Goal: Stressors or current issues are controlled or eliminated.;Short Term goal: Identification and review with participant of any Quality of Life or Depression concerns found by scoring the questionnaire.;Long Term goal: The participant improves quality of Life and PHQ9 Scores as seen by post scores and/or verbalization of changes       Quality of Life Scores:  Quality of Life - 05/12/18 1217      Quality of Life   Select  Quality of Life      Quality of Life Scores   Health/Function Pre  18.53 %    Socioeconomic Pre  26.5 %    Psych/Spiritual Pre  28.58 %    Family Pre  24.4 %    GLOBAL Pre  22.83 %      Scores of 19 and below usually indicate a  poorer quality of life in these areas.  A difference of  2-3 points is a clinically meaningful difference.  A difference of 2-3 points in the total score of the Quality of Life Index has been associated with significant improvement in overall quality of life, self-image, physical symptoms, and general health in studies assessing change in quality of life.  PHQ-9: Recent Review Flowsheet Data    Depression screen [facility] 2/9 06/10/2018 05/12/2018 05/06/2018 03/17/2018 02/25/2018   Decreased Interest 0 - 0 0 0   Down, Depressed, Hopeless 0 - 0 0 0   PHQ - 2 Score 0 - 0 0 0   Altered sleeping 0 1 - - 0   Tired, decreased energy 0 2 - -  0   Change in appetite 0 0 - - 0   Feeling bad or failure about yourself  0 0 - - 0   Trouble concentrating 0 0 - - 0   Moving slowly or fidgety/restless 0 0 - - 0   Suicidal thoughts 0 0 - - 0   PHQ-9 Score 0 - - - 0   Difficult doing work/chores Not difficult at all Not difficult at all - - Not difficult at all     Interpretation of Total Score  Total Score Depression Severity:  1-4 = Minimal depression, 5-9 = Mild depression, 10-14 = Moderate depression, 15-19 = Moderately severe depression, 20-27 = Severe depression   Psychosocial Evaluation and Intervention:   Psychosocial Re-Evaluation: Psychosocial Re-Evaluation    [place] Name 06/07/18 1008             Psychosocial Re-Evaluation   Current issues with  Current Stress Concerns       Comments  [name]'s biggest problem is his stress levels at home and he is scheduled to meet with [name] to discuss.        Expected Outcomes  Short: Meet with conselor.  Long: Continue to work on recommendations.        Interventions  Stress management education;Encouraged to attend Cardiac Rehabilitation for the exercise       Continue Psychosocial Services   Follow up required by counselor          Psychosocial Discharge (Final Psychosocial Re-Evaluation): Psychosocial Re-Evaluation - 06/07/18 1008      Psychosocial  Re-Evaluation   Current issues with  Current Stress Concerns    Comments  [name]'s biggest problem is his stress levels at home and he is scheduled to meet with [name] to discuss.     Expected Outcomes  Short: Meet with conselor.  Long: Continue to work on recommendations.     Interventions  Stress management education;Encouraged to attend Cardiac Rehabilitation for the exercise    Continue Psychosocial Services   Follow up required by counselor       Vocational Rehabilitation: Provide vocational rehab assistance to qualifying candidates.   Vocational Rehab Evaluation & Intervention: Vocational Rehab - 05/12/18 1222      Initial Vocational Rehab Evaluation & Intervention   Assessment shows need for Vocational Rehabilitation  No       Education: Education Goals: Education classes will be provided on a variety of topics geared toward better understanding of heart health and risk factor modification. Participant will state understanding/return demonstration of topics presented as noted by education test scores.  Learning Barriers/Preferences: Learning Barriers/Preferences - 05/12/18 1221      Learning Barriers/Preferences   Learning Barriers  None    Learning Preferences  None       Education Topics:  AED/CPR: - Group verbal and written instruction with the use of models to demonstrate the basic use of the AED with the basic ABC's of resuscitation.   General Nutrition Guidelines/Fats and Fiber: -Group instruction provided by verbal, written material, models and posters to present the general guidelines for heart healthy nutrition. Gives an explanation and review of dietary fats and fiber.   Controlling Sodium/Reading Food Labels: -Group verbal and written material supporting the discussion of sodium use in heart healthy nutrition. Review and explanation with models, verbal and written materials for utilization of the food label.   Exercise Physiology & General Exercise  Guidelines: - Group verbal and written instruction with models to review the exercise physiology of the  cardiovascular system and associated critical values. Provides general exercise guidelines with specific guidelines to those with heart or lung disease.    Aerobic Exercise & Resistance Training: - Gives group verbal and written instruction on the various components of exercise. Focuses on aerobic and resistive training programs and the benefits of this training and how to safely progress through these programs..   Flexibility, Balance, Mind/Body Relaxation: Provides group verbal/written instruction on the benefits of flexibility and balance training, including mind/body exercise modes such as yoga, pilates and tai chi.  Demonstration and skill practice provided.   Stress and Anxiety: - Provides group verbal and written instruction about the health risks of elevated stress and causes of high stress.  Discuss the correlation between heart/lung disease and anxiety and treatment options. Review healthy ways to manage with stress and anxiety.   Cardiac Rehab from 06/07/2018 in [facility] Cardiac and Pulmonary Rehab  Date  05/24/18  Educator  [facility]  Instruction Review Code  5- Refused Teaching [patient had yesterday]      Depression: - Provides group verbal and written instruction on the correlation between heart/lung disease and depressed mood, treatment options, and the stigmas associated with seeking treatment.   Anatomy & Physiology of the Heart: - Group verbal and written instruction and models provide basic cardiac anatomy and physiology, with the coronary electrical and arterial systems. Review of Valvular disease and Heart Failure   Cardiac Procedures: - Group verbal and written instruction to review commonly prescribed medications for heart disease. Reviews the medication, class of the drug, and side effects. Includes the steps to properly store meds and maintain the prescription regimen.  (beta blockers and nitrates)   Cardiac Medications I: - Group verbal and written instruction to review commonly prescribed medications for heart disease. Reviews the medication, class of the drug, and side effects. Includes the steps to properly store meds and maintain the prescription regimen.   Cardiac Rehab from 06/07/2018 in [facility] Cardiac and Pulmonary Rehab  Date  05/31/18  Educator  SB  Instruction Review Code  1- Verbalizes Understanding      Cardiac Medications II: -Group verbal and written instruction to review commonly prescribed medications for heart disease. Reviews the medication, class of the drug, and side effects. (all other drug classes)   Cardiac Rehab from 06/07/2018 in [facility] Cardiac and Pulmonary Rehab  Date  05/19/18  Educator  CE  Instruction Review Code  1- Verbalizes Understanding       Go Sex-Intimacy & Heart Disease, Get SMART - Goal Setting: - Group verbal and written instruction through game format to discuss heart disease and the return to sexual intimacy. Provides group verbal and written material to discuss and apply goal setting through the application of the S.M.A.R.T. Method.   Other Matters of the Heart: - Provides group verbal, written materials and models to describe Stable Angina and Peripheral Artery. Includes description of the disease process and treatment options available to the cardiac patient.   Exercise & Equipment Safety: - Individual verbal instruction and demonstration of equipment use and safety with use of the equipment.   Cardiac Rehab from 06/07/2018 in [facility] Cardiac and Pulmonary Rehab  Date  05/12/18  Educator  SB  Instruction Review Code  1- Verbalizes Understanding      Infection Prevention: - Provides verbal and written material to individual with discussion of infection control including proper hand washing and proper equipment cleaning during exercise session.   Cardiac Rehab from 06/07/2018 in [facility] Cardiac and Pulmonary Rehab  Date  05/12/18  Educator  Sb  Instruction Review Code  1- Verbalizes Understanding      Falls Prevention: - Provides verbal and written material to individual with discussion of falls prevention and safety.   Cardiac Rehab from 06/07/2018 in [facility] Cardiac and Pulmonary Rehab  Date  05/12/18  Educator  SB  Instruction Review Code  1- Verbalizes Understanding      Diabetes: - Individual verbal and written instruction to review signs/symptoms of diabetes, desired ranges of glucose level fasting, after meals and with exercise. Acknowledge that pre and post exercise glucose checks will be done for 3 sessions at entry of program.   Cardiac Rehab from 06/07/2018 in [facility] Cardiac and Pulmonary Rehab  Date  05/12/18  Educator  SB  Instruction Review Code  1- Verbalizes Understanding      Know Your Numbers and Risk Factors: -Group verbal and written instruction about important numbers in your health.  Discussion of what are risk factors and how they play a role in the disease process.  Review of Cholesterol, Blood Pressure, Diabetes, and BMI and the role they play in your overall health.   Cardiac Rehab from 06/07/2018 in [facility] Cardiac and Pulmonary Rehab  Date  05/19/18  Educator  CE  Instruction Review Code  1- Verbalizes Understanding      Sleep Hygiene: -Provides group verbal and written instruction about how sleep can affect your health.  Define sleep hygiene, discuss sleep cycles and impact of sleep habits. Review good sleep hygiene tips.    Cardiac Rehab from 06/07/2018 in [facility] Cardiac and Pulmonary Rehab  Date  06/07/18  Educator  [facility]  Instruction Review Code  1- Verbalizes Understanding      Other: -Provides group and verbal instruction on various topics (see comments)   Knowledge Questionnaire Score: Knowledge Questionnaire Score - 05/12/18 1221      Knowledge Questionnaire Score   Pre Score  25/26   correct response reviewed with [name]. He vebalized understanding. No further  questions today      Core Components/Risk Factors/Patient Goals at Admission: Personal Goals and Risk Factors at Admission - 05/12/18 1222      Core Components/Risk Factors/Patient Goals on Admission    Weight Management  Obesity;Weight Loss;Yes    Intervention  Weight Management: Develop a combined nutrition and exercise program designed to reach desired caloric intake, while maintaining appropriate intake of nutrient and fiber, sodium and fats, and appropriate energy expenditure required for the weight goal.    Admit Weight  232 lb 3.2 oz (105.3 kg)    Goal Weight: Short Term  230 lb (104.3 kg)    Goal Weight: Long Term  190 lb (86.2 kg)    Expected Outcomes  Short Term: Continue to assess and modify interventions until short term weight is achieved;Long Term: Adherence to nutrition and physical activity/exercise program aimed toward attainment of established weight goal;Weight Loss: Understanding of general recommendations for a balanced deficit meal plan, which promotes 1-2 lb weight loss per week and includes a negative energy balance of [phone] kcal/d    Diabetes  Yes    Intervention  Provide education about signs/symptoms and action to take for hypo/hyperglycemia.;Provide education about proper nutrition, including hydration, and aerobic/resistive exercise prescription along with prescribed medications to achieve blood glucose in normal ranges: Fasting glucose 65-99 mg/dL    Expected Outcomes  Short Term: Participant verbalizes understanding of the signs/symptoms and immediate care of hyper/hypoglycemia, proper foot care and importance of medication, aerobic/resistive exercise and  nutrition plan for blood glucose control.;Long Term: Attainment of HbA1C < 7%.    Heart Failure  Yes    Intervention  Provide a combined exercise and nutrition program that is supplemented with education, support and counseling about heart failure. Directed toward relieving symptoms such as shortness of breath,  decreased exercise tolerance, and extremity edema.    Expected Outcomes  Improve functional capacity of life;Short term: Attendance in program 2-3 days a week with increased exercise capacity. Reported lower sodium intake. Reported increased fruit and vegetable intake. Reports medication compliance.;Short term: Daily weights obtained and reported for increase. Utilizing diuretic protocols set by physician.;Long term: Adoption of self-care skills and reduction of barriers for early signs and symptoms recognition and intervention leading to self-care maintenance.    Hypertension  Yes    Intervention  Provide education on lifestyle modifcations including regular physical activity/exercise, weight management, moderate sodium restriction and increased consumption of fresh fruit, vegetables, and low fat dairy, alcohol moderation, and smoking cessation.;Monitor prescription use compliance.    Expected Outcomes  Short Term: Continued assessment and intervention until BP is < 140/58m HG in hypertensive participants. < 130/862mHG in hypertensive participants with diabetes, heart failure or chronic kidney disease.;Long Term: Maintenance of blood pressure at goal levels.    Lipids  Yes    Intervention  Provide education and support for participant on nutrition & aerobic/resistive exercise along with prescribed medications to achieve LDL '70mg'$ , HDL >'40mg'$ .    Expected Outcomes  Short Term: Participant states understanding of desired cholesterol values and is compliant with medications prescribed. Participant is following exercise prescription and nutrition guidelines.;Long Term: Cholesterol controlled with medications as prescribed, with individualized exercise RX and with personalized nutrition plan. Value goals: LDL < '70mg'$ , HDL > 40 mg.       Core Components/Risk Factors/Patient Goals Review:  Goals and Risk Factor Review    Row Name 06/07/18 1013             Core Components/Risk Factors/Patient Goals  Review   Personal Goals Review  Weight Management/Obesity;Diabetes;Hypertension;Heart Failure;Lipids       Review  TiOctavia Bruckneras been working on his blood sugars with a better diet and chaning his timing of his insulin.  He is now taking his insulin in the morning and after exercise on days he comes to rehab.   Overall, his weight is down three pounds since he started rehab.   He has not had any heart failure symptoms, but does have some occasionaly chest pain which is similar to his pattern post stent previously.  Blood pressure have been between 110-120/60-70 and check those three times a day at home.  He is doing well on his medications and since the lasix was adjusted he is feeling better overall.        Expected Outcomes  Short: Continue to work on weight loss and diabetes.  Long: Continue to monitor risk factors.           Core Components/Risk Factors/Patient Goals at Discharge (Final Review):  Goals and Risk Factor Review - 06/07/18 1013      Core Components/Risk Factors/Patient Goals Review   Personal Goals Review  Weight Management/Obesity;Diabetes;Hypertension;Heart Failure;Lipids    Review  TiOctavia Bruckneras been working on his blood sugars with a better diet and chaning his timing of his insulin.  He is now taking his insulin in the morning and after exercise on days he comes to rehab.   Overall, his weight is down three pounds since he started  rehab.   He has not had any heart failure symptoms, but does have some occasionaly chest pain which is similar to his pattern post stent previously.  Blood pressure have been between 110-120/60-70 and check those three times a day at home.  He is doing well on his medications and since the lasix was adjusted he is feeling better overall.     Expected Outcomes  Short: Continue to work on weight loss and diabetes.  Long: Continue to monitor risk factors.        ITP Comments: ITP Comments    Row Name 05/12/18 1225 05/18/18 0849 06/15/18 0607       ITP Comments   Medical review completed today. INitial ITP sent to Dr Caryn Section to review, change as needed and to sign. Diagnosis documentation can be found in [facility] 04/25/2018 Encounter  30 day review completed. ITP sent to Dr. Ramonita Lab, covering for Dr. Emily Filbert, Medical Director of Cardiac Rehab. Continue with ITP unless changes are made by physician. New to program. Has yet to start exercise.   30 day review completed. ITP sent to Dr. Emily Filbert, Medical Director of Cardiac Rehab. Continue with ITP unless changes are made by physician        Comments:

## 2018-06-15 NOTE — Telephone Encounter (Signed)
STAT if patient feels like he/she is going to faint   1) Are you dizzy now? no  2) Do you feel faint or have you passed out?  Feels faint when changing from sit to stand no passing out   3) Do you have any other symptoms? Muscles cramps hands feet and mostly in calf   4) Have you checked your HR and BP (record if available)?  BP Trends 120's/60's last few days 110/50             HR trends 100-110 last night 90's    Patient says he is not sure if this is due to med change or too much lasix  patient feels dehydrated and is drinking a lot of water

## 2018-06-15 NOTE — Telephone Encounter (Signed)
I attempted to call the patient. I left a message on his identified voice mail to call back.

## 2018-06-15 NOTE — Telephone Encounter (Signed)
I spoke with the patient.  He is states for the last 2 days he has been having feelings of pre-syncope mostly with standing.  He has been going to cardiac rehab, but did not go yesterday due to how bad he was feeling.  He states that his BP (HR) usually run about 120/64 (100) and the last couple of days he has been 110/50 (94). No formal orthostatic readings have been done.  The patient reports he feels like he has been dehydrated although his fluid intake has been consistent with his normal intake.  He has also been having a "crampy" feeling in his the tops of his feet, especially when waking up in the morning.   Confirmed with the patient that he is taking: 1) lasix 40 mg - 1/2 tablet (20 mg) once daily 2) Coreg 6.25 mg- take 1 tablet BID  (dose was increased at his last office visit with Dr. Fletcher Anon). 3) Entresto 24/26 mg- take 1 tablet BID  The patient states he just finished grocery shopping and at the end, he felt like he needed to hold on to something due to dizziness. He advises that he did hold his lasix dose this morning. He states he feels a little better at this point in the day compared to the last 2 days.  Weight has been stable at home within a 1 lb over the last 2 weeks.  I have advised the patient that I will forward to Dr. Fletcher Anon to review.  I have asked that he continue to hold his lasix until we receive recommendations from Dr. Fletcher Anon. He is aware that he may not receive a call back until tomorrow. He is agreeable and voices understanding of the above.

## 2018-06-16 ENCOUNTER — Other Ambulatory Visit
Admission: RE | Admit: 2018-06-16 | Discharge: 2018-06-16 | Disposition: A | Discharge status: Home / Self Care | Payer: Medicare Other | Source: Ambulatory Visit | Attending: Cardiovascular Disease | Admitting: Cardiovascular Disease

## 2018-06-16 ENCOUNTER — Encounter: Payer: Medicare Other | Admitting: *Deleted

## 2018-06-16 DIAGNOSIS — Z955 Presence of coronary angioplasty implant and graft: Secondary | ICD-10-CM | POA: Diagnosis not present

## 2018-06-16 DIAGNOSIS — E1142 Type 2 diabetes mellitus with diabetic polyneuropathy: Secondary | ICD-10-CM | POA: Diagnosis not present

## 2018-06-16 DIAGNOSIS — R000 Tachycardia, unspecified: Secondary | ICD-10-CM | POA: Insufficient documentation

## 2018-06-16 DIAGNOSIS — Z7982 Long term (current) use of aspirin: Secondary | ICD-10-CM | POA: Diagnosis not present

## 2018-06-16 DIAGNOSIS — E785 Hyperlipidemia, unspecified: Secondary | ICD-10-CM | POA: Diagnosis not present

## 2018-06-16 DIAGNOSIS — Z794 Long term (current) use of insulin: Secondary | ICD-10-CM | POA: Diagnosis not present

## 2018-06-16 DIAGNOSIS — J45909 Unspecified asthma, uncomplicated: Secondary | ICD-10-CM | POA: Diagnosis not present

## 2018-06-16 DIAGNOSIS — Z79899 Other long term (current) drug therapy: Secondary | ICD-10-CM | POA: Diagnosis not present

## 2018-06-16 DIAGNOSIS — I110 Hypertensive heart disease with heart failure: Secondary | ICD-10-CM | POA: Diagnosis not present

## 2018-06-16 DIAGNOSIS — R42 Dizziness and giddiness: Secondary | ICD-10-CM | POA: Diagnosis not present

## 2018-06-16 DIAGNOSIS — I25118 Atherosclerotic heart disease of native coronary artery with other forms of angina pectoris: Secondary | ICD-10-CM | POA: Diagnosis not present

## 2018-06-16 DIAGNOSIS — I5022 Chronic systolic (congestive) heart failure: Secondary | ICD-10-CM | POA: Diagnosis not present

## 2018-06-16 LAB — BASIC METABOLIC PANEL
Anion gap: 7 (ref 5–15)
BUN: 31 mg/dL — ABNORMAL HIGH (ref 6–20)
CHLORIDE: 103 mmol/L (ref 98–111)
CO2: 27 mmol/L (ref 22–32)
Calcium: 9.5 mg/dL (ref 8.9–10.3)
Creatinine, Ser: 1.45 mg/dL — ABNORMAL HIGH (ref 0.61–1.24)
GFR calc non Af Amer: 52 mL/min — ABNORMAL LOW (ref >60)
GFR, EST AFRICAN AMERICAN: 60 mL/min — AB (ref >60)
Glucose, Bld: 150 mg/dL — ABNORMAL HIGH (ref 70–99)
Potassium: 5.9 mmol/L — ABNORMAL HIGH (ref 3.5–5.1)
Sodium: 137 mmol/L (ref 135–145)

## 2018-06-16 LAB — CBC WITH DIFFERENTIAL/PLATELET
BASOS PCT: 1 %
Basophils Absolute: 0.1 10*3/uL (ref 0–0.1)
Eosinophils Absolute: 0.4 10*3/uL (ref 0–0.7)
Eosinophils Relative: 6 %
HEMATOCRIT: 37.7 % — AB (ref 40.0–52.0)
HEMOGLOBIN: 12.8 g/dL — AB (ref 13.0–18.0)
LYMPHS ABS: 1.2 10*3/uL (ref 1.0–3.6)
LYMPHS PCT: 19 %
MCH: 29.5 pg (ref 26.0–34.0)
MCHC: 33.9 g/dL (ref 32.0–36.0)
MCV: 86.9 fL (ref 80.0–100.0)
MONOS PCT: 8 %
Monocytes Absolute: 0.5 10*3/uL (ref 0.2–1.0)
NEUTROS ABS: 4.2 10*3/uL (ref 1.4–6.5)
NEUTROS PCT: 66 %
Platelets: 185 10*3/uL (ref 150–440)
RBC: 4.34 MIL/uL — ABNORMAL LOW (ref 4.40–5.90)
RDW: 14.2 % (ref 11.5–14.5)
WBC: 6.3 10*3/uL (ref 3.8–10.6)

## 2018-06-16 NOTE — Telephone Encounter (Signed)
The patient's BMP/ CBC have resulted to Dr. Tyrell Antonio in-basket to review.  K+ 5.9 today. Will review with Dr. Fletcher Anon in clinic.

## 2018-06-16 NOTE — Telephone Encounter (Signed)
I spoke with the patient. He is aware of Dr. Tyrell Antonio recommendations to have a BMP/ CBC drawn and to hold lasix until we have the results of these back.  The patient voices understanding and is agreeable. He is going to Cardiac Rehab today, so he will go to the [facility] for labs when he is here at the hospital.  He is aware to hold lasix until Dr. Fletcher Anon reviews the results of his labs and we call him back.  The patient does state that he feels a lot better today.

## 2018-06-16 NOTE — Telephone Encounter (Signed)
The patient's labs were results were reviewed with Dr. Fletcher Anon. Orders received to have the patient: 1) continue to hold lasix 2) increase his fluid intake 3) repeat his BMP on Monday 06/20/18  I attempted to call the patient.  I left a message on his identified voice mail (ok per DPR) of his lab results and Dr. Tyrell Antonio recommendations.  I advised him that I will put his BMP order in for the hospital lab so that he can go on Monday 9/16 for a repeat lab.  I asked that he call back with any questions.

## 2018-06-16 NOTE — Progress Notes (Signed)
Daily Session Note  Patient Details  Name: [name] MRN: [id number] Date of Birth: Mar 31, 1961 Referring Provider:     Cardiac Rehab from 05/12/2018 in [facility] Cardiac and Pulmonary Rehab  Referring Provider  Arida      Encounter Date: 06/16/2018  Check In: Session Check In - 06/16/18 0951      Check-In   Staff Present  Alberteen Sam, MA, RCEP, CCRP, Exercise Physiologist;Joseph Hood RCP,RRT,BSRT;Carroll Enterkin, RN, Geralyn Corwin, RN BSN    Medication changes reported      No    Fall or balance concerns reported     No    Tobacco Cessation  No Change    Warm-up and Cool-down  Performed on first and last piece of equipment    Resistance Training Performed  Yes    VAD Patient?  No    PAD/SET Patient?  No      Pain Assessment   Currently in Pain?  No/denies    Multiple Pain Sites  No          Social History   Tobacco Use  Smoking Status Never Smoker  Smokeless Tobacco Never Used  Tobacco Comment   smoking cessation materials not required    Goals Met:  Independence with exercise equipment Exercise tolerated well No report of cardiac concerns or symptoms Strength training completed today  Goals Unmet:  Not Applicable  Comments: Pt able to follow exercise prescription today without complaint.  Will continue to monitor for progression.    Dr. Emily Filbert is Medical Director for [facility].

## 2018-06-16 NOTE — Telephone Encounter (Signed)
Check CBC and basic metabolic profile.  Hold furosemide until we get the results.

## 2018-06-16 NOTE — Addendum Note (Signed)
Addended by: Alvis Lemmings C on: 06/16/2018 02:54 PM   Modules accepted: Orders

## 2018-06-16 NOTE — Addendum Note (Signed)
Addended by: Alvis Lemmings C on: 06/16/2018 08:24 AM   Modules accepted: Orders

## 2018-06-20 ENCOUNTER — Other Ambulatory Visit
Admission: RE | Admit: 2018-06-20 | Discharge: 2018-06-20 | Disposition: A | Discharge status: Home / Self Care | Payer: Medicare Other | Source: Ambulatory Visit | Attending: Cardiovascular Disease | Admitting: Cardiovascular Disease

## 2018-06-20 DIAGNOSIS — E875 Hyperkalemia: Secondary | ICD-10-CM | POA: Insufficient documentation

## 2018-06-20 LAB — BASIC METABOLIC PANEL
Anion gap: 7 (ref 5–15)
BUN: 24 mg/dL — AB (ref 6–20)
CHLORIDE: 102 mmol/L (ref 98–111)
CO2: 29 mmol/L (ref 22–32)
Calcium: 9.6 mg/dL (ref 8.9–10.3)
Creatinine, Ser: 1.36 mg/dL — ABNORMAL HIGH (ref 0.61–1.24)
GFR calc Af Amer: >60 mL/min (ref >60)
GFR calc non Af Amer: 56 mL/min — ABNORMAL LOW (ref >60)
Glucose, Bld: 180 mg/dL — ABNORMAL HIGH (ref 70–99)
Potassium: 5.2 mmol/L — ABNORMAL HIGH (ref 3.5–5.1)
SODIUM: 138 mmol/L (ref 135–145)

## 2018-06-21 ENCOUNTER — Encounter: Payer: Self-pay | Admitting: Family

## 2018-06-21 ENCOUNTER — Encounter: Payer: Medicare Other | Admitting: *Deleted

## 2018-06-21 ENCOUNTER — Ambulatory Visit: Payer: Medicare Other | Attending: Family | Admitting: Family

## 2018-06-21 VITALS — BP 110/63 | HR 100 | Resp 18 | Ht 67.0 in | Wt 233.0 lb

## 2018-06-21 DIAGNOSIS — K219 Gastro-esophageal reflux disease without esophagitis: Secondary | ICD-10-CM | POA: Insufficient documentation

## 2018-06-21 DIAGNOSIS — I10 Essential (primary) hypertension: Secondary | ICD-10-CM

## 2018-06-21 DIAGNOSIS — G4733 Obstructive sleep apnea (adult) (pediatric): Secondary | ICD-10-CM | POA: Diagnosis not present

## 2018-06-21 DIAGNOSIS — I509 Heart failure, unspecified: Secondary | ICD-10-CM | POA: Diagnosis not present

## 2018-06-21 DIAGNOSIS — Z7982 Long term (current) use of aspirin: Secondary | ICD-10-CM | POA: Insufficient documentation

## 2018-06-21 DIAGNOSIS — Z794 Long term (current) use of insulin: Secondary | ICD-10-CM | POA: Insufficient documentation

## 2018-06-21 DIAGNOSIS — G7000 Myasthenia gravis without (acute) exacerbation: Secondary | ICD-10-CM | POA: Insufficient documentation

## 2018-06-21 DIAGNOSIS — G4700 Insomnia, unspecified: Secondary | ICD-10-CM | POA: Diagnosis not present

## 2018-06-21 DIAGNOSIS — Z955 Presence of coronary angioplasty implant and graft: Secondary | ICD-10-CM

## 2018-06-21 DIAGNOSIS — Z79899 Other long term (current) drug therapy: Secondary | ICD-10-CM | POA: Diagnosis not present

## 2018-06-21 DIAGNOSIS — Z7902 Long term (current) use of antithrombotics/antiplatelets: Secondary | ICD-10-CM | POA: Insufficient documentation

## 2018-06-21 DIAGNOSIS — I2510 Atherosclerotic heart disease of native coronary artery without angina pectoris: Secondary | ICD-10-CM | POA: Insufficient documentation

## 2018-06-21 DIAGNOSIS — I252 Old myocardial infarction: Secondary | ICD-10-CM | POA: Insufficient documentation

## 2018-06-21 DIAGNOSIS — I25118 Atherosclerotic heart disease of native coronary artery with other forms of angina pectoris: Secondary | ICD-10-CM | POA: Diagnosis not present

## 2018-06-21 DIAGNOSIS — I110 Hypertensive heart disease with heart failure: Secondary | ICD-10-CM | POA: Diagnosis not present

## 2018-06-21 DIAGNOSIS — M1990 Unspecified osteoarthritis, unspecified site: Secondary | ICD-10-CM | POA: Diagnosis not present

## 2018-06-21 DIAGNOSIS — I5022 Chronic systolic (congestive) heart failure: Secondary | ICD-10-CM | POA: Diagnosis not present

## 2018-06-21 DIAGNOSIS — E785 Hyperlipidemia, unspecified: Secondary | ICD-10-CM | POA: Insufficient documentation

## 2018-06-21 DIAGNOSIS — E1142 Type 2 diabetes mellitus with diabetic polyneuropathy: Secondary | ICD-10-CM | POA: Diagnosis not present

## 2018-06-21 DIAGNOSIS — I255 Ischemic cardiomyopathy: Secondary | ICD-10-CM | POA: Insufficient documentation

## 2018-06-21 DIAGNOSIS — A6920 Lyme disease, unspecified: Secondary | ICD-10-CM | POA: Diagnosis not present

## 2018-06-21 DIAGNOSIS — J45909 Unspecified asthma, uncomplicated: Secondary | ICD-10-CM | POA: Diagnosis not present

## 2018-06-21 NOTE — Progress Notes (Signed)
Daily Session Note  Patient Details  Name: [name] MRN: [id number] Date of Birth: 08-17-1961 Referring Provider:     Cardiac Rehab from 05/12/2018 in [facility] Cardiac and Pulmonary Rehab  Referring Provider  Fletcher Anon      Encounter Date: 06/21/2018  Check In: Session Check In - 06/21/18 1032      Check-In   Supervising physician immediately available to respond to emergencies  See telemetry face sheet for immediately available ER MD    Location  [facility]-Cardiac & Pulmonary Rehab    Staff Present  Alberteen Sam, MA, RCEP, CCRP, Exercise Physiologist;Amanda Oletta Darter, BA, ACSM CEP, Exercise Physiologist;Joseph Christain [place], RN BSN    Medication changes reported      Yes    Comments  d/c'd Lasix    Fall or balance concerns reported     No    Warm-up and Cool-down  Performed on first and last piece of equipment    Resistance Training Performed  Yes    VAD Patient?  No    PAD/SET Patient?  No      Pain Assessment   Currently in Pain?  No/denies          Social History   Tobacco Use  Smoking Status Never Smoker  Smokeless Tobacco Never Used  Tobacco Comment   smoking cessation materials not required    Goals Met:  Independence with exercise equipment Exercise tolerated well No report of cardiac concerns or symptoms Strength training completed today  Goals Unmet:  Not Applicable  Comments: Pt able to follow exercise prescription today without complaint.  Will continue to monitor for progression.    Dr. Emily Filbert is Medical Director for [place] [facility].

## 2018-06-21 NOTE — Patient Instructions (Addendum)
Continue weighing daily and call for an overnight weight gain of > 2 pounds or a weekly weight gain of >5 pounds.  Can take the fluid pill as needed for weight gain or swelling.

## 2018-06-21 NOTE — Progress Notes (Signed)
Patient ID: [name], male    DOB: [birth year], 57 y.o.   MRN: [id number]  HPI  Mr Ogle is a 57 y/o male with a history of asthma, CAD, DM, hyperlipidemia, HTN,GERD, lyme disease, myasthenia gravis, MI, obstructive sleep apnea, multiple stents and chronic heart failure.   Echo report from 03/31/18 reviewed and showed an EF of 30-35% along with mild MR.   Left heart catheterization done 04/25/18 which showed an EF of 25-35% along with severely elevated left ventricular end-diastolic pressure. DES placed in mid left circumflex.   Admitted 04/25/18 for above catheterization, otherwise, no admissions or ED visits in the last 6 months.  He presents today for a follow-up visit with a chief complaint of moderate fatigue upon minimal exertion. He describes this as chronic in nature having been present for several years. He has associated shortness of breath, wheezing, dizziness and neuropathy along with this. He denies any difficulty sleeping, abdominal distention, palpitations, pedal edema, chest pain, cough or weight gain. Lasix has been stopped due to BP and renal and patient feels like he's a little more swollen in his hands than he normally is. Does tend to take a daily 2 hour nap due to his myasthenia gravis.   Past Medical History:  Diagnosis Date  . Allergy    dust, seasonal (worse in the fall).  . 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 Followed by Pain Specialist in [place], back and neck  . Asthma    BRONCHITIS  . Cataract    First Dx in 2012  . CHF (congestive heart failure) ([place])   . Coronary artery disease    a. Prior Ant MI->s/p multiple stents placed in the LAD and right coronary artery ([place]); b. 2016 Cath: reportedly nonobs dzs;  c. 02/2017 MV: EF 45-54%, ap/ant, ap/inf, apical infarct, no ishcemia; c. 04/2018 Cath/PCI: LM nl, LAD 20p, patent mid stent, LCX 68m(3.25x15 Sierra DES), OM1 nl, OM2 50, OM3 40 w/ patent stent, RCA 40p, 8m, 40d w/ patent stent in RPDA, RPAV 60, EF 25-35%.  2+MR.  . Diabetes mellitus without complication ([place])    TYPE 2  . Diabetic peripheral neuropathy (H[facility])    feet and hands  . GERD (gastroesophageal reflux disease)   . Headache    muscle tension  . Hyperlipidemia   . Hypertension    CONTROLLED ON MEDS  . Insomnia   . Ischemic cardiomyopathy    a. 03/2018 Echo: EF 30-35%, ant, antlat, apical AK, Gr1 DD, mild MR, mildly dil LA.  [name] Lyme disease    Chronic  . Myasthenia gravis ([place])   . Myocardial infarction ([place]) 2010  . Seasonal allergies   . Sleep apnea    CPAP   Past Surgical History:  Procedure Laterality Date  . BILATERAL CARPAL TUNNEL RELEASE Bilateral L in 2012 and R in 2013  . CARDIAC CATHETERIZATION     Several Caths, most recent in  March 2016.  [name] COLONOSCOPY WITH PROPOFOL N/A 01/10/2016   Procedure: COLONOSCOPY WITH PROPOFOL;  Surgeon: [name], MD;  Location: [facility];  Service: Endoscopy;  Laterality: N/A;  . CORONARY ANGIOPLASTY    . CORONARY STENT INTERVENTION N/A 04/25/2018   Procedure: CORONARY STENT INTERVENTION;  Surgeon: [name], MD;  Location: [facility];  Service: Cardiovascular;  Laterality: N/A;  . ESOPHAGOGASTRODUODENOSCOPY (EGD) WITH PROPOFOL N/A 01/10/2016   Procedure: ESOPHAGOGASTRODUODENOSCOPY (EGD) WITH PROPOFOL;  Surgeon: [name], MD;  Location: [facility];  Service: Endoscopy;  Laterality: N/A;  .  EYE SURGERY Bilateral 2012   cataract/bilateral vitrectomies  . LEFT HEART CATH AND CORONARY ANGIOGRAPHY Left 04/25/2018   Procedure: LEFT HEART CATH AND CORONARY ANGIOGRAPHY;  Surgeon: [name], MD;  Location: [facility];  Service: Cardiovascular;  Laterality: Left;  . TONSILLECTOMY AND ADENOIDECTOMY     As a child  . TUNNELED VENOUS CATHETER PLACEMENT     removed   Family History  Problem Relation Age of Onset  . Diabetes Mother   . Heart disease Mother   . Cancer Father        Prostate CA  . Diabetes Brother   . Healthy Brother   . Healthy  Brother    Social History   Tobacco Use  . Smoking status: Never Smoker  . Smokeless tobacco: Never Used  . Tobacco comment: smoking cessation materials not required  Substance Use Topics  . Alcohol use: Not Currently    Alcohol/week: 0.0 standard drinks   Allergies  Allergen Reactions  . Metoprolol Rash  . Novolog [Insulin Aspart] Hives   Prior to Admission medications   Medication Sig Start Date End Date Taking? Authorizing Provider  albuterol (PROVENTIL HFA;VENTOLIN HFA) 108 (90 Base) MCG/ACT inhaler Inhale 2 puffs into the lungs every 6 (six) hours as needed for wheezing or shortness of breath.   Yes [provider]  albuterol (PROVENTIL) (2.5 MG/3ML) 0.083% nebulizer solution Take 2.5 mg by nebulization every 6 (six) hours as needed for wheezing or shortness of breath.   Yes [provider]  aspirin EC 81 MG tablet Take 1 tablet (81 mg total) by mouth daily. 03/28/18  Yes Theora Gianotti, NP  BREO ELLIPTA 200-25 MCG/INH AEPB INHALE 1 PUFF INTO THE LUNGS DAILY 06/08/18  Yes Poulose, Bethel Born, NP  carvedilol (COREG) 6.25 MG tablet Take 1 tablet (6.25 mg total) by mouth 2 (two) times daily. 05/12/18  Yes [name], MD  clopidogrel (PLAVIX) 75 MG tablet Take 1 tablet (75 mg total) by mouth daily with breakfast. 04/27/18  Yes Theora Gianotti, NP  cyanocobalamin 1000 MCG tablet Take 1,000 mcg by mouth daily. am   Yes [provider]  cyclobenzaprine (FLEXERIL) 10 MG tablet Take 1 tablet (10 mg total) by mouth 2 (two) times daily. 05/09/18  Yes Molli Barrows, MD  dexlansoprazole (DEXILANT) 60 MG capsule Take 1 capsule (60 mg total) by mouth daily. 12/06/17  Yes [name], MD  ezetimibe (ZETIA) 10 MG tablet Take 1 tablet (10 mg total) by mouth daily. 04/11/18 07/10/18 Yes Theora Gianotti, NP  furosemide (LASIX) 40 MG tablet Take 0.5 tablets (20 mg total) by mouth daily. 04/28/18  Yes Theora Gianotti, NP  gabapentin  (NEURONTIN) 300 MG capsule Take 1 capsule (300 mg total) by mouth 3 (three) times daily. 09/25/15  Yes Roselee Nova, MD  gabapentin (NEURONTIN) 800 MG tablet Take 1 tablet (800 mg total) by mouth at bedtime. 09/25/15  Yes Rochel Brome A, MD  insulin lispro (HUMALOG KWIKPEN) 100 UNIT/ML KiwkPen Inject 7-8 Units into the skin 3 (three) times daily. Reported on 12/02/2015/ sliding scale 1 unit for every 8 units of carbs; and 1 unit for every 20 above 120   Yes [provider]  Insulin Pen Needle (FIFTY50 PEN NEEDLES) 32G X 4 MM MISC Inject 1 each into the skin as needed. 02/24/18  Yes [provider]  LANTUS SOLOSTAR 100 UNIT/ML Solostar Pen Inject 33-35 Units into the skin daily at 10 pm.  03/25/16  Yes Sherri Sear, MD  levocetirizine (XYZAL) 5 MG tablet Take 1 tablet (5 mg total) by mouth every evening. 06/07/18  Yes Poulose, Bethel Born, NP  liraglutide (VICTOZA) 18 MG/3ML SOPN 1.8 units every morning 04/26/18  Yes Theora Gianotti, NP  loratadine (CLARITIN) 10 MG tablet Take 10 mg by mouth daily. am   Yes [provider]  metFORMIN (GLUCOPHAGE) 1000 MG tablet Take 1,000 mg by mouth 2 (two) times daily with a meal.   Yes Sherri Sear, MD  montelukast (SINGULAIR) 10 MG tablet Take 10 mg by mouth at bedtime. 04/01/16  Yes Erby Pian, MD  naloxone [facility]) nasal spray 4 mg/0.1 mL For excess sedation from opioids 09/15/16  Yes Molli Barrows, MD  nitroGLYCERIN (NITROSTAT) 0.3 MG SL tablet Place 1 tablet (0.3 mg total) under the tongue every 5 (five) minutes as needed for chest pain. Reported on 12/02/2015 01/25/17  Yes [name], MD  Oxycodone HCl 10 MG TABS Take 1 tablet (10 mg total) by mouth 4 (four) times daily as needed. 05/09/18  Yes Molli Barrows, MD  pyridostigmine (MESTINON) 60 MG tablet Take 60 mg by mouth 4 (four) times daily.   Yes Anabel Bene, MD  rosuvastatin (CRESTOR) 5 MG tablet Take 5 mg by mouth daily. 11/20/17  Yes  [name], MD  sacubitril-valsartan (ENTRESTO) 24-26 MG Take 1 tablet by mouth 2 (two) times daily. 04/26/18  Yes Theora Gianotti, NP  triamcinolone cream (KENALOG) 0.1 % apply to affected area twice a day [place] as needed 04/09/17  Yes [provider]    Review of Systems  Constitutional: Positive for fatigue (easily). Negative for appetite change.  HENT: Negative for congestion, postnasal drip and sore throat.   Eyes: Negative.   Respiratory: Positive for shortness of breath (with moderate exertion) and wheezing (last night). Negative for cough.   Cardiovascular: Negative for chest pain, palpitations and leg swelling.  Gastrointestinal: Negative for abdominal distention and abdominal pain.  Endocrine: Negative.   Genitourinary: Negative.   Musculoskeletal: Positive for back pain (lower left part). Negative for neck pain.  Skin: Negative.   Allergic/Immunologic: Negative.   Neurological: Positive for dizziness (2 days ago) and numbness (neuropathy in feet). Negative for light-headedness.  Hematological: Negative for adenopathy. Does not bruise/bleed easily.  Psychiatric/Behavioral: Negative for dysphoric mood and sleep disturbance (sleeping on 2 pillows). The patient is not nervous/anxious.    Vitals:   06/21/18 1117  BP: 110/63  Pulse: 100  Resp: 18  SpO2: 98%  Weight: 233 lb (105.7 kg)  Height: 5\' 7"  (1.702 m)   Wt Readings from Last 3 Encounters:  06/21/18 233 lb (105.7 kg)  06/10/18 233 lb 6.4 oz (105.9 kg)  05/12/18 232 lb 3.2 oz (105.3 kg)   Lab Results  Component Value Date   CREATININE 1.36 (H) 06/20/2018   CREATININE 1.45 (H) 06/16/2018   CREATININE 1.25 (H) 05/06/2018    Physical Exam  Constitutional: He is oriented to person, place, and time. He appears well-developed and well-nourished.  HENT:  Head: Normocephalic and atraumatic.  Neck: Normal range of motion. Neck supple. No JVD present.  Cardiovascular: Regular rhythm.  Tachycardia present.  Pulmonary/Chest: Effort normal. No respiratory distress. He has wheezes in the right upper field, the right lower field and the left lower field.  Abdominal: Soft. He exhibits no distension.  Musculoskeletal: He exhibits no edema or tenderness.  Neurological: He is alert and oriented to person,  place, and time.  Skin: Skin is warm and dry.  Psychiatric: He has a normal mood and affect. His behavior is normal. Thought content normal.  Nursing note and vitals reviewed.   Assessment & Plan:  1: Chronic heart failure with reduced ejection fraction- - NYHA class III - euvolemic today - weighing daily and says that his weight has been stable. Reminded to call for an overnight weight gain of >2 pounds or a weekly weight gain of >5 pounds - weight stable from last visit here 6 weeks ago - did advise him that he could take his diuretic as needed based on weight gain or edema - not adding salt to his food and has been reading food labels for sodium content. Reviewed the importance of keeping daily sodium intake to 2000mg   - tachycardic today so may need to titrate carvedilol in the future - BP today limits titration of entresto - saw cardiology Fletcher Anon) 05/12/18 - saw pulmonology Raul Del) 05/20/18 - participating in cardiac rehab  - reports receiving his flu vaccine for this season  2: HTN- - BP looks good today  - saw PCP Ancil Boozer) 06/10/18 - BMP 06/20/18 reviewed and showed sodium 138, potassium 5.2, creatinine 1.36 and GFR 56  3: DM- - fasting glucose at home today was 130 - saw endocrinology Honor Junes) 06/01/18 - A1c 06/01/18 was 7.9%  Medication list was reviewed.  Return in 6 months or sooner for any questions/problems before then.

## 2018-06-22 ENCOUNTER — Encounter: Payer: Self-pay | Admitting: Family

## 2018-06-23 DIAGNOSIS — E785 Hyperlipidemia, unspecified: Secondary | ICD-10-CM | POA: Diagnosis not present

## 2018-06-23 DIAGNOSIS — E1142 Type 2 diabetes mellitus with diabetic polyneuropathy: Secondary | ICD-10-CM | POA: Diagnosis not present

## 2018-06-23 DIAGNOSIS — Z79899 Other long term (current) drug therapy: Secondary | ICD-10-CM | POA: Diagnosis not present

## 2018-06-23 DIAGNOSIS — Z955 Presence of coronary angioplasty implant and graft: Secondary | ICD-10-CM

## 2018-06-23 DIAGNOSIS — I110 Hypertensive heart disease with heart failure: Secondary | ICD-10-CM | POA: Diagnosis not present

## 2018-06-23 DIAGNOSIS — Z794 Long term (current) use of insulin: Secondary | ICD-10-CM | POA: Diagnosis not present

## 2018-06-23 DIAGNOSIS — J45909 Unspecified asthma, uncomplicated: Secondary | ICD-10-CM | POA: Diagnosis not present

## 2018-06-23 DIAGNOSIS — I5022 Chronic systolic (congestive) heart failure: Secondary | ICD-10-CM | POA: Diagnosis not present

## 2018-06-23 DIAGNOSIS — Z7982 Long term (current) use of aspirin: Secondary | ICD-10-CM | POA: Diagnosis not present

## 2018-06-23 DIAGNOSIS — I25118 Atherosclerotic heart disease of native coronary artery with other forms of angina pectoris: Secondary | ICD-10-CM | POA: Diagnosis not present

## 2018-06-23 NOTE — Progress Notes (Signed)
Daily Session Note  Patient Details  Name: [name] MRN: [id number] Date of Birth: 11/28/1960 Referring Provider:     Cardiac Rehab from 05/12/2018 in [facility]  Referring Provider  Arida      Encounter Date: 06/23/2018  Check In: Session Check In - 06/23/18 0916      Check-In   Supervising physician immediately available to respond to emergencies  See telemetry face sheet for immediately available ER MD    Location  [facility]    Staff Present  Jessica Hawkins, MA, RCEP, CCRP, Exercise Physiologist;Joseph Hood RCP,RRT,BSRT;Amanda Sommer, BA, ACSM CEP, Exercise Physiologist;Carroll Enterkin, RN, BSN    Medication changes reported      No    Fall or balance concerns reported     No    Warm-up and Cool-down  Performed on first and last piece of equipment    Resistance Training Performed  Yes    VAD Patient?  No    PAD/SET Patient?  No      Pain Assessment   Currently in Pain?  No/denies    Multiple Pain Sites  No          Social History   Tobacco Use  Smoking Status Never Smoker  Smokeless Tobacco Never Used  Tobacco Comment   smoking cessation materials not required    Goals Met:  Independence with exercise equipment Exercise tolerated well No report of cardiac concerns or symptoms Strength training completed today  Goals Unmet:  Not Applicable  Comments: Pt able to follow exercise prescription today without complaint.  Will continue to monitor for progression.    Dr. Mark Miller is Medical Director for [facility] and [facility]. 

## 2018-06-23 NOTE — Telephone Encounter (Signed)
The patient called back to the office. He states that the day prior to stopping lasix (about week ago), his weight was already up a pound from his dry weight. Dry weight on his home scale is ~ 227 lbs. However, yesterday he was 229 lbs and today he was 234 lbs. He cannot tell that he is having additional lower extremity swelling, but his hands are swollen a little bit.  He was at cardiac rehab today and did feel a little more SOB. His urine output is down off lasix. I have advised him to take lasix 20 mg in the morning and then we will follow up with Dr. Fletcher Anon for further recommendations going forward. The patient is agreeable and voices understanding.

## 2018-06-23 NOTE — Telephone Encounter (Signed)
I left a message for the patient to call for further details of his symptoms. I advised I would forward to Dr. Fletcher Anon in the interim for any recommendations.

## 2018-06-23 NOTE — Telephone Encounter (Signed)
Patient calling to update States that since he has been off the Lasix he has since gained 5 pounds Also he would like to know when he should consider going back on the Lasix medication or will there be other options considered Please call to discuss

## 2018-06-24 MED ORDER — FUROSEMIDE 40 MG PO TABS: ORAL_TABLET | ORAL | 6 refills | Status: DC

## 2018-06-24 NOTE — Addendum Note (Signed)
Addended by: Alvis Lemmings C on: 06/24/2018 04:27 PM   Modules accepted: Orders

## 2018-06-24 NOTE — Telephone Encounter (Signed)
Reviewed the information below with Dr. Fletcher Anon. Orders received to have the patient to take lasix 20 mg once daily x 2 days and then 20 mg once every other day after that.  I have called and left a detailed message on the patient's identified voice mail of the above recommendations. I asked that he call back with any further questions/ concerns.  He was instructed last night to take a 20 mg tablet of lasix this morning.

## 2018-06-28 ENCOUNTER — Telehealth: Payer: Self-pay | Admitting: Gastroenterology

## 2018-06-28 DIAGNOSIS — H02402 Unspecified ptosis of left eyelid: Secondary | ICD-10-CM | POA: Diagnosis not present

## 2018-06-28 DIAGNOSIS — G7000 Myasthenia gravis without (acute) exacerbation: Secondary | ICD-10-CM | POA: Diagnosis not present

## 2018-06-28 NOTE — Telephone Encounter (Signed)
PT needs a refill on DEXLAINT 60 MG CAPSULE.

## 2018-06-29 ENCOUNTER — Other Ambulatory Visit: Payer: Self-pay

## 2018-06-29 MED ORDER — DEXLANSOPRAZOLE 60 MG PO CPDR: 60.0000 mg | DELAYED_RELEASE_CAPSULE | Freq: Every day | ORAL | 6 refills | Status: DC

## 2018-06-29 NOTE — Telephone Encounter (Signed)
error 

## 2018-06-29 NOTE — Telephone Encounter (Signed)
Dexilant refill sent to pt's pharmacy per his request.

## 2018-07-01 ENCOUNTER — Ambulatory Visit: Payer: Medicare Other | Admitting: Cardiovascular Disease

## 2018-07-01 DIAGNOSIS — Z794 Long term (current) use of insulin: Secondary | ICD-10-CM | POA: Diagnosis not present

## 2018-07-01 DIAGNOSIS — E1165 Type 2 diabetes mellitus with hyperglycemia: Secondary | ICD-10-CM | POA: Diagnosis not present

## 2018-07-05 ENCOUNTER — Encounter: Payer: Self-pay | Admitting: *Deleted

## 2018-07-05 ENCOUNTER — Telehealth: Payer: Self-pay | Admitting: *Deleted

## 2018-07-05 ENCOUNTER — Encounter: Payer: Medicare Other | Attending: Cardiovascular Disease

## 2018-07-05 DIAGNOSIS — Z79899 Other long term (current) drug therapy: Secondary | ICD-10-CM | POA: Insufficient documentation

## 2018-07-05 DIAGNOSIS — I25118 Atherosclerotic heart disease of native coronary artery with other forms of angina pectoris: Secondary | ICD-10-CM | POA: Insufficient documentation

## 2018-07-05 DIAGNOSIS — Z79890 Hormone replacement therapy (postmenopausal): Secondary | ICD-10-CM | POA: Insufficient documentation

## 2018-07-05 DIAGNOSIS — I110 Hypertensive heart disease with heart failure: Secondary | ICD-10-CM | POA: Insufficient documentation

## 2018-07-05 DIAGNOSIS — Z794 Long term (current) use of insulin: Secondary | ICD-10-CM | POA: Insufficient documentation

## 2018-07-05 DIAGNOSIS — Z7982 Long term (current) use of aspirin: Secondary | ICD-10-CM | POA: Insufficient documentation

## 2018-07-05 DIAGNOSIS — Z955 Presence of coronary angioplasty implant and graft: Secondary | ICD-10-CM

## 2018-07-05 DIAGNOSIS — I5022 Chronic systolic (congestive) heart failure: Secondary | ICD-10-CM | POA: Insufficient documentation

## 2018-07-05 DIAGNOSIS — E785 Hyperlipidemia, unspecified: Secondary | ICD-10-CM | POA: Insufficient documentation

## 2018-07-05 DIAGNOSIS — E669 Obesity, unspecified: Secondary | ICD-10-CM | POA: Insufficient documentation

## 2018-07-05 DIAGNOSIS — J45909 Unspecified asthma, uncomplicated: Secondary | ICD-10-CM | POA: Insufficient documentation

## 2018-07-05 DIAGNOSIS — E1142 Type 2 diabetes mellitus with diabetic polyneuropathy: Secondary | ICD-10-CM | POA: Insufficient documentation

## 2018-07-05 NOTE — Telephone Encounter (Signed)
Called to check on pt.  Last attended on 9/19.  Left message on mobile voicemail.

## 2018-07-06 ENCOUNTER — Telehealth: Payer: Self-pay | Admitting: *Deleted

## 2018-07-07 ENCOUNTER — Inpatient Hospital Stay
Admission: EM | Admit: 2018-07-07 | Discharge: 2018-07-13 | DRG: 871 | Disposition: A | Discharge status: Home Health | Payer: Medicare Other | Attending: Internal Medicine | Admitting: Internal Medicine

## 2018-07-07 ENCOUNTER — Emergency Department: Payer: Medicare Other

## 2018-07-07 ENCOUNTER — Ambulatory Visit: Payer: Medicare Other | Admitting: Anesthesiology

## 2018-07-07 ENCOUNTER — Other Ambulatory Visit: Payer: Self-pay

## 2018-07-07 ENCOUNTER — Encounter: Payer: Self-pay | Admitting: Radiology

## 2018-07-07 ENCOUNTER — Encounter: Payer: Medicare Other | Admitting: Anesthesiology

## 2018-07-07 ENCOUNTER — Encounter: Payer: Self-pay | Admitting: Anesthesiology

## 2018-07-07 VITALS — BP 103/61 | HR 100 | Temp 97.9°F | Resp 18 | Ht 67.0 in | Wt 229.0 lb

## 2018-07-07 DIAGNOSIS — E1122 Type 2 diabetes mellitus with diabetic chronic kidney disease: Secondary | ICD-10-CM | POA: Diagnosis present

## 2018-07-07 DIAGNOSIS — M542 Cervicalgia: Secondary | ICD-10-CM | POA: Insufficient documentation

## 2018-07-07 DIAGNOSIS — R0602 Shortness of breath: Secondary | ICD-10-CM | POA: Diagnosis not present

## 2018-07-07 DIAGNOSIS — M5136 Other intervertebral disc degeneration, lumbar region: Secondary | ICD-10-CM | POA: Diagnosis present

## 2018-07-07 DIAGNOSIS — M51369 Other intervertebral disc degeneration, lumbar region without mention of lumbar back pain or lower extremity pain: Secondary | ICD-10-CM

## 2018-07-07 DIAGNOSIS — M5431 Sciatica, right side: Secondary | ICD-10-CM | POA: Diagnosis present

## 2018-07-07 DIAGNOSIS — E785 Hyperlipidemia, unspecified: Secondary | ICD-10-CM | POA: Diagnosis not present

## 2018-07-07 DIAGNOSIS — G894 Chronic pain syndrome: Secondary | ICD-10-CM

## 2018-07-07 DIAGNOSIS — M5432 Sciatica, left side: Secondary | ICD-10-CM

## 2018-07-07 DIAGNOSIS — D649 Anemia, unspecified: Secondary | ICD-10-CM | POA: Diagnosis not present

## 2018-07-07 DIAGNOSIS — I2510 Atherosclerotic heart disease of native coronary artery without angina pectoris: Secondary | ICD-10-CM | POA: Diagnosis not present

## 2018-07-07 DIAGNOSIS — F1190 Opioid use, unspecified, uncomplicated: Secondary | ICD-10-CM

## 2018-07-07 DIAGNOSIS — Z955 Presence of coronary angioplasty implant and graft: Secondary | ICD-10-CM

## 2018-07-07 DIAGNOSIS — I25110 Atherosclerotic heart disease of native coronary artery with unstable angina pectoris: Secondary | ICD-10-CM | POA: Diagnosis present

## 2018-07-07 DIAGNOSIS — N189 Chronic kidney disease, unspecified: Secondary | ICD-10-CM | POA: Diagnosis not present

## 2018-07-07 DIAGNOSIS — R509 Fever, unspecified: Secondary | ICD-10-CM

## 2018-07-07 DIAGNOSIS — Z79899 Other long term (current) drug therapy: Secondary | ICD-10-CM | POA: Insufficient documentation

## 2018-07-07 DIAGNOSIS — Z7901 Long term (current) use of anticoagulants: Secondary | ICD-10-CM | POA: Insufficient documentation

## 2018-07-07 DIAGNOSIS — Z7982 Long term (current) use of aspirin: Secondary | ICD-10-CM | POA: Insufficient documentation

## 2018-07-07 DIAGNOSIS — K529 Noninfective gastroenteritis and colitis, unspecified: Secondary | ICD-10-CM | POA: Diagnosis not present

## 2018-07-07 DIAGNOSIS — R000 Tachycardia, unspecified: Secondary | ICD-10-CM | POA: Diagnosis not present

## 2018-07-07 DIAGNOSIS — N170 Acute kidney failure with tubular necrosis: Secondary | ICD-10-CM | POA: Diagnosis present

## 2018-07-07 DIAGNOSIS — G4733 Obstructive sleep apnea (adult) (pediatric): Secondary | ICD-10-CM | POA: Diagnosis present

## 2018-07-07 DIAGNOSIS — I5030 Unspecified diastolic (congestive) heart failure: Secondary | ICD-10-CM | POA: Diagnosis not present

## 2018-07-07 DIAGNOSIS — I214 Non-ST elevation (NSTEMI) myocardial infarction: Secondary | ICD-10-CM | POA: Diagnosis present

## 2018-07-07 DIAGNOSIS — N179 Acute kidney failure, unspecified: Secondary | ICD-10-CM

## 2018-07-07 DIAGNOSIS — Z9989 Dependence on other enabling machines and devices: Secondary | ICD-10-CM

## 2018-07-07 DIAGNOSIS — R410 Disorientation, unspecified: Secondary | ICD-10-CM | POA: Diagnosis not present

## 2018-07-07 DIAGNOSIS — M47817 Spondylosis without myelopathy or radiculopathy, lumbosacral region: Secondary | ICD-10-CM | POA: Diagnosis not present

## 2018-07-07 DIAGNOSIS — R4781 Slurred speech: Secondary | ICD-10-CM | POA: Diagnosis not present

## 2018-07-07 DIAGNOSIS — R1110 Vomiting, unspecified: Secondary | ICD-10-CM | POA: Diagnosis not present

## 2018-07-07 DIAGNOSIS — I25118 Atherosclerotic heart disease of native coronary artery with other forms of angina pectoris: Secondary | ICD-10-CM | POA: Diagnosis not present

## 2018-07-07 DIAGNOSIS — N183 Chronic kidney disease, stage 3 (moderate): Secondary | ICD-10-CM | POA: Diagnosis not present

## 2018-07-07 DIAGNOSIS — IMO0002 Reserved for concepts with insufficient information to code with codable children: Secondary | ICD-10-CM | POA: Diagnosis present

## 2018-07-07 DIAGNOSIS — Z8042 Family history of malignant neoplasm of prostate: Secondary | ICD-10-CM

## 2018-07-07 DIAGNOSIS — Z79891 Long term (current) use of opiate analgesic: Secondary | ICD-10-CM

## 2018-07-07 DIAGNOSIS — K219 Gastro-esophageal reflux disease without esophagitis: Secondary | ICD-10-CM | POA: Diagnosis present

## 2018-07-07 DIAGNOSIS — Z833 Family history of diabetes mellitus: Secondary | ICD-10-CM

## 2018-07-07 DIAGNOSIS — J9601 Acute respiratory failure with hypoxia: Secondary | ICD-10-CM | POA: Diagnosis not present

## 2018-07-07 DIAGNOSIS — D696 Thrombocytopenia, unspecified: Secondary | ICD-10-CM | POA: Diagnosis not present

## 2018-07-07 DIAGNOSIS — G7000 Myasthenia gravis without (acute) exacerbation: Secondary | ICD-10-CM | POA: Diagnosis present

## 2018-07-07 DIAGNOSIS — Z794 Long term (current) use of insulin: Secondary | ICD-10-CM

## 2018-07-07 DIAGNOSIS — E1165 Type 2 diabetes mellitus with hyperglycemia: Secondary | ICD-10-CM | POA: Diagnosis not present

## 2018-07-07 DIAGNOSIS — E118 Type 2 diabetes mellitus with unspecified complications: Secondary | ICD-10-CM | POA: Diagnosis not present

## 2018-07-07 DIAGNOSIS — E1169 Type 2 diabetes mellitus with other specified complication: Secondary | ICD-10-CM | POA: Diagnosis present

## 2018-07-07 DIAGNOSIS — A419 Sepsis, unspecified organism: Secondary | ICD-10-CM | POA: Diagnosis present

## 2018-07-07 DIAGNOSIS — I130 Hypertensive heart and chronic kidney disease with heart failure and stage 1 through stage 4 chronic kidney disease, or unspecified chronic kidney disease: Secondary | ICD-10-CM | POA: Diagnosis not present

## 2018-07-07 DIAGNOSIS — R6520 Severe sepsis without septic shock: Secondary | ICD-10-CM | POA: Diagnosis present

## 2018-07-07 DIAGNOSIS — R4182 Altered mental status, unspecified: Secondary | ICD-10-CM

## 2018-07-07 DIAGNOSIS — E1142 Type 2 diabetes mellitus with diabetic polyneuropathy: Secondary | ICD-10-CM | POA: Diagnosis not present

## 2018-07-07 DIAGNOSIS — M545 Low back pain: Secondary | ICD-10-CM

## 2018-07-07 DIAGNOSIS — I255 Ischemic cardiomyopathy: Secondary | ICD-10-CM | POA: Diagnosis not present

## 2018-07-07 DIAGNOSIS — G9341 Metabolic encephalopathy: Secondary | ICD-10-CM | POA: Diagnosis present

## 2018-07-07 DIAGNOSIS — A6923 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: Secondary | ICD-10-CM

## 2018-07-07 DIAGNOSIS — J99 Respiratory disorders in diseases classified elsewhere: Secondary | ICD-10-CM

## 2018-07-07 DIAGNOSIS — I252 Old myocardial infarction: Secondary | ICD-10-CM | POA: Diagnosis not present

## 2018-07-07 DIAGNOSIS — I509 Heart failure, unspecified: Secondary | ICD-10-CM | POA: Diagnosis not present

## 2018-07-07 DIAGNOSIS — I5042 Chronic combined systolic (congestive) and diastolic (congestive) heart failure: Secondary | ICD-10-CM | POA: Diagnosis not present

## 2018-07-07 DIAGNOSIS — Z8249 Family history of ischemic heart disease and other diseases of the circulatory system: Secondary | ICD-10-CM

## 2018-07-07 DIAGNOSIS — Z7902 Long term (current) use of antithrombotics/antiplatelets: Secondary | ICD-10-CM

## 2018-07-07 HISTORY — DX: Chronic combined systolic (congestive) and diastolic (congestive) heart failure: I50.42

## 2018-07-07 LAB — PROTIME-INR
INR: 1.26
PROTHROMBIN TIME: 15.7 s — AB (ref 11.4–15.2)

## 2018-07-07 LAB — CBC WITH DIFFERENTIAL/PLATELET
BAND NEUTROPHILS: 15 %
BASOS ABS: 0 10*3/uL (ref 0–0.1)
BASOS PCT: 0 %
Blasts: 0 %
EOS ABS: 0 10*3/uL (ref 0–0.7)
EOS PCT: 1 %
HCT: 34.4 % — ABNORMAL LOW (ref 40.0–52.0)
HEMOGLOBIN: 11.9 g/dL — AB (ref 13.0–18.0)
Lymphocytes Relative: 7 %
Lymphs Abs: 0.1 10*3/uL — ABNORMAL LOW (ref 1.0–3.6)
MCH: 30 pg (ref 26.0–34.0)
MCHC: 34.7 g/dL (ref 32.0–36.0)
MCV: 86.5 fL (ref 80.0–100.0)
METAMYELOCYTES PCT: 1 %
MONO ABS: 0.1 10*3/uL — AB (ref 0.2–1.0)
Monocytes Relative: 4 %
Myelocytes: 0 %
NEUTROS PCT: 72 %
Neutro Abs: 1.5 10*3/uL (ref 1.4–6.5)
Other: 0 %
PLATELETS: 105 10*3/uL — AB (ref 150–440)
PROMYELOCYTES RELATIVE: 0 %
RBC: 3.98 MIL/uL — ABNORMAL LOW (ref 4.40–5.90)
RDW: 14.7 % — ABNORMAL HIGH (ref 11.5–14.5)
WBC: 1.7 10*3/uL — ABNORMAL LOW (ref 3.8–10.6)
nRBC: 1 /100 WBC — ABNORMAL HIGH

## 2018-07-07 LAB — COMPREHENSIVE METABOLIC PANEL
ALBUMIN: 3.4 g/dL — AB (ref 3.5–5.0)
ALK PHOS: 86 U/L (ref 38–126)
ALT: 30 U/L (ref 0–44)
ANION GAP: 9 (ref 5–15)
AST: 40 U/L (ref 15–41)
BILIRUBIN TOTAL: 1.4 mg/dL — AB (ref 0.3–1.2)
BUN: 36 mg/dL — AB (ref 6–20)
CO2: 25 mmol/L (ref 22–32)
Calcium: 8.7 mg/dL — ABNORMAL LOW (ref 8.9–10.3)
Chloride: 104 mmol/L (ref 98–111)
Creatinine, Ser: 1.74 mg/dL — ABNORMAL HIGH (ref 0.61–1.24)
GFR calc Af Amer: 48 mL/min — ABNORMAL LOW (ref >60)
GFR calc non Af Amer: 41 mL/min — ABNORMAL LOW (ref >60)
GLUCOSE: 275 mg/dL — AB (ref 70–99)
Potassium: 4 mmol/L (ref 3.5–5.1)
SODIUM: 138 mmol/L (ref 135–145)
TOTAL PROTEIN: 6.1 g/dL — AB (ref 6.5–8.1)

## 2018-07-07 LAB — URINALYSIS, COMPLETE (UACMP) WITH MICROSCOPIC
Bilirubin Urine: NEGATIVE
Ketones, ur: 5 mg/dL — AB
LEUKOCYTES UA: NEGATIVE
NITRITE: NEGATIVE
Protein, ur: 100 mg/dL — AB
SPECIFIC GRAVITY, URINE: 1.016 (ref 1.005–1.030)
SQUAMOUS EPITHELIAL / LPF: NONE SEEN (ref 0–5)
pH: 5 (ref 5.0–8.0)

## 2018-07-07 LAB — GLUCOSE, CAPILLARY: Glucose-Capillary: 229 mg/dL — ABNORMAL HIGH (ref 70–99)

## 2018-07-07 LAB — LACTIC ACID, PLASMA: Lactic Acid, Venous: 2.5 mmol/L (ref 0.5–1.9)

## 2018-07-07 MED ORDER — SODIUM CHLORIDE 0.9 % IV SOLN: 2.0000 g | Freq: Two times a day (BID) | INTRAVENOUS | Status: DC

## 2018-07-07 MED ORDER — SODIUM CHLORIDE 0.9 % IV SOLN
INTRAVENOUS | Status: AC
  Filled 2018-07-07: qty 2

## 2018-07-07 MED ORDER — OXYCODONE HCL 10 MG PO TABS: 10.0000 mg | ORAL_TABLET | Freq: Four times a day (QID) | ORAL | 0 refills | Status: DC | PRN

## 2018-07-07 MED ORDER — VANCOMYCIN HCL 10 G IV SOLR
1250.0000 mg | INTRAVENOUS | Status: DC
  Administered 2018-07-08: 1250 mg via INTRAVENOUS
  Filled 2018-07-07 (×2): qty 1250

## 2018-07-07 MED ORDER — CYCLOBENZAPRINE HCL 10 MG PO TABS: 10.0000 mg | ORAL_TABLET | Freq: Two times a day (BID) | ORAL | 5 refills | Status: DC

## 2018-07-07 MED ORDER — VANCOMYCIN HCL IN DEXTROSE 1-5 GM/200ML-% IV SOLN
1000.0000 mg | Freq: Once | INTRAVENOUS | Status: AC
  Administered 2018-07-07: 1000 mg via INTRAVENOUS
  Filled 2018-07-07: qty 200

## 2018-07-07 MED ORDER — METRONIDAZOLE IN NACL 5-0.79 MG/ML-% IV SOLN
500.0000 mg | Freq: Three times a day (TID) | INTRAVENOUS | Status: DC
  Administered 2018-07-07 – 2018-07-13 (×16): 500 mg via INTRAVENOUS
  Filled 2018-07-07 (×20): qty 100

## 2018-07-07 MED ORDER — LACTATED RINGERS IV BOLUS (SEPSIS)
1000.0000 mL | Freq: Once | INTRAVENOUS | Status: AC
  Administered 2018-07-07: 1000 mL via INTRAVENOUS

## 2018-07-07 MED ORDER — ACETAMINOPHEN 650 MG RE SUPP
RECTAL | Status: AC
  Administered 2018-07-07: 650 mg
  Filled 2018-07-07: qty 1

## 2018-07-07 MED ORDER — IOHEXOL 300 MG/ML  SOLN
75.0000 mL | Freq: Once | INTRAMUSCULAR | Status: AC | PRN
  Administered 2018-07-07: 75 mL via INTRAVENOUS

## 2018-07-07 MED ORDER — SODIUM CHLORIDE 0.9 % IV SOLN
2.0000 g | Freq: Once | INTRAVENOUS | Status: AC
  Administered 2018-07-07: 2 g via INTRAVENOUS
  Filled 2018-07-07: qty 2

## 2018-07-07 NOTE — Progress Notes (Signed)
Pharmacy Antibiotic Note  EULISES KIJOWSKI is a 57 y.o. male admitted on 07/07/2018 with unknown source.  Pharmacy has been consulted for vancomycin and cefepime dosing.  Plan: DW 84 kg  Vd 59L kei 0.049 hr-1  T1/2 14 hours Vancomycin 1250 mg q 18 hours ordered with stacked dosing. Level before 5th dose. Goal trough 15-20  Cefepime 2 grams q 12 hours ordered.  Height: 5\' 8"  (172.7 cm) Weight: 237 lb (107.5 kg) IBW/kg (Calculated) : 68.4  Temp (24hrs), Avg:100.3 F (37.9 C), Min:97.9 F (36.6 C), Max:103.1 F (39.5 C)  Recent Labs  Lab 07/07/18 2005  WBC 1.7*  CREATININE 1.74*  LATICACIDVEN 2.5*    Estimated Creatinine Clearance: 54.3 mL/min (A) (by C-G formula based on SCr of 1.74 mg/dL (H)).    Allergies  Allergen Reactions  . Metoprolol Rash  . Novolog [Insulin Aspart] Hives    Antimicrobials this admission: Zosyn x1; vancomycin, cefepime 10/4  >>    >>   Dose adjustments this admission:   Microbiology results: 10/3 BCx: pending      10/3 UA: (-) 10/3 CXR: no acute abnormality Thank you for allowing pharmacy to be a part of this patient's care.  Davielle Lingelbach S 07/07/2018 11:29 PM

## 2018-07-07 NOTE — Patient Instructions (Signed)
Paper scripts X 2 for oxycodone handed to patient. All other scripts e-scribed to pharmacy given by patient.

## 2018-07-07 NOTE — Progress Notes (Signed)
CODE SEPSIS - PHARMACY COMMUNICATION  **Broad Spectrum Antibiotics should be administered within 1 hour of Sepsis diagnosis**  Time Code Sepsis Called/Page Received:  10/3 @ 20:15   Antibiotics Ordered:  Cefepime and Vancomycin   Time of 1st antibiotic administration:  Cefepime @ 20:39   Additional action taken by pharmacy:       If necessary, Name of Provider/Nurse Contacted:     Necia Kamm D ,PharmD Clinical Pharmacist  07/07/2018  8:56 PM

## 2018-07-07 NOTE — Progress Notes (Signed)
Nursing Pain Medication Assessment:  Safety precautions to be maintained throughout the outpatient stay will include: orient to surroundings, keep bed in low position, maintain call bell within reach at all times, provide assistance with transfer out of bed and ambulation.  Medication Inspection Compliance: Pill count conducted under aseptic conditions, in front of the patient. Neither the pills nor the bottle was removed from the patient's sight at any time. Once count was completed pills were immediately returned to the patient in their original bottle.  Medication: Oxycodone IR Pill/Patch Count: 60 of 120 pills remain Pill/Patch Appearance: Markings consistent with prescribed medication Bottle Appearance: Standard pharmacy container. Clearly labeled. Filled Date: 09/17 / 2019 Last Medication intake:  Today

## 2018-07-07 NOTE — ED Triage Notes (Signed)
EMS stated that pt was altered on arrival, family stated that pt was not response to him when his name was being called. Pt was responsive to painful stimuli. O2 was 85% on RA, Pt was placed on 2 liters and was 91-92% per EMS, fever was 102.3. IV was placed and pt received 300 ml of NS prior to arrival.

## 2018-07-07 NOTE — ED Notes (Signed)
Pt is altered on arrival to ED, pt will turn his head and look when his name is called. When questions is asked, he will mumble some words but some words can  be understood. Pt's skin is pale in color.

## 2018-07-07 NOTE — ED Provider Notes (Signed)
[facility] Emergency Department Provider Note   ____________________________________________   I have reviewed the triage vital signs and the nursing notes.   HISTORY  Chief Complaint Fever and Altered Mental Status   History limited by and level 5 caveat due to AMS   HPI [name] is a 57 y.o. male who presents to the emergency department today via EMS with altered mental status.  Apparently the patient started developing this altered mental status today.  Patient cannot give any history.   Per medical record review patient has a history of DM, CAD  Past Medical History:  Diagnosis Date  . Allergy    dust, seasonal (worse in the fall).  . 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 Followed by Pain Specialist in [place], back and neck  . Asthma    BRONCHITIS  . Cataract    First Dx in 2012  . CHF (congestive heart failure) ([place])   . Coronary artery disease    a. Prior Ant MI->s/p multiple stents placed in the LAD and right coronary artery ([place]); b. 2016 Cath: reportedly nonobs dzs;  c. 02/2017 MV: EF 45-54%, ap/ant, ap/inf, apical infarct, no ishcemia; c. 04/2018 Cath/PCI: LM nl, LAD 20p, patent mid stent, LCX 83m(3.25x15 [place] DES), OM1 nl, OM2 50, OM3 40 w/ patent stent, RCA 40p, 78m, 40d w/ patent stent in RPDA, RPAV 60, EF 25-35%. 2+MR.  . Diabetes mellitus without complication ([place])    TYPE 2  . Diabetic peripheral neuropathy (HCC)    feet and hands  . GERD (gastroesophageal reflux disease)   . Headache    muscle tension  . Hyperlipidemia   . Hypertension    CONTROLLED ON MEDS  . Insomnia   . Ischemic cardiomyopathy    a. 03/2018 Echo: EF 30-35%, ant, antlat, apical AK, Gr1 DD, mild MR, mildly dil LA.  [name] Lyme disease    Chronic  . Myasthenia gravis ([place])   . Myocardial infarction ([place]) 2010  . Seasonal allergies   . Sleep apnea    CPAP    Patient Active Problem List   Diagnosis Date Noted  . Unstable angina ([place]) 04/26/2018  .  Ischemic cardiomyopathy 04/26/2018  . Chronic systolic CHF (congestive heart failure) ([place]) 04/26/2018  . Effort angina ([place]) 04/25/2018  . 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 Hyperlipidemia due to type 2 diabetes mellitus ([place]) 08/12/2017  . Vitamin D deficiency, unspecified 08/12/2017  . Ptosis of left eyelid 01/08/2017  . Dermatitis 12/24/2016  . Difficulty walking 04/27/2016  . Foot cramps 04/27/2016  . Morbid (severe) obesity due to excess calories ([place]) 04/06/2016  . Benign neoplasm of sigmoid colon   . Benign neoplasm of descending colon   . Benign neoplasm of transverse colon   . Coronary artery disease involving native coronary artery with angina pectoris ([employer]) 10/22/2015  . Coronary artery disease 08/22/2015  . DM (diabetes mellitus), type 2, uncontrolled with complications ([place]) 07/86/7544  . Myasthenia gravis ([place]) 08/22/2015  . Chronic left-sided low back pain 08/22/2015  . Hypertension 08/22/2015  . GERD (gastroesophageal reflux disease) 08/22/2015  . Arthritis 08/22/2015  . Diabetic peripheral neuropathy ([place]) 08/22/2015  . Lyme disease 08/22/2015  . Hyperlipidemia 08/22/2015  . Insomnia 08/22/2015  . Type 2 diabetes mellitus with diabetic polyneuropathy, with long-term current use of insulin ([place]) 08/22/2015    Past Surgical History:  Procedure Laterality Date  . BILATERAL CARPAL TUNNEL RELEASE Bilateral L in 2012 and R in 2013  .  CARDIAC CATHETERIZATION     Several Caths, most recent in  March 2016.  [name] COLONOSCOPY WITH PROPOFOL N/A 01/10/2016   Procedure: COLONOSCOPY WITH PROPOFOL;  Surgeon: [name], MD;  Location: [facility] ENDOSCOPY;  Service: Endoscopy;  Laterality: N/A;  . CORONARY ANGIOPLASTY    . CORONARY STENT INTERVENTION N/A 04/25/2018   Procedure: CORONARY STENT INTERVENTION;  Surgeon: [name], MD;  Location: [place] [facility];  Service: Cardiovascular;  Laterality: N/A;  . ESOPHAGOGASTRODUODENOSCOPY (EGD)  WITH PROPOFOL N/A 01/10/2016   Procedure: ESOPHAGOGASTRODUODENOSCOPY (EGD) WITH PROPOFOL;  Surgeon: [name], MD;  Location: [facility] ENDOSCOPY;  Service: Endoscopy;  Laterality: N/A;  . EYE SURGERY Bilateral 2012   cataract/bilateral vitrectomies  . LEFT HEART CATH AND CORONARY ANGIOGRAPHY Left 04/25/2018   Procedure: LEFT HEART CATH AND CORONARY ANGIOGRAPHY;  Surgeon: [name], MD;  Location: [place] [facility];  Service: Cardiovascular;  Laterality: Left;  . TONSILLECTOMY AND ADENOIDECTOMY     As a child  . TUNNELED VENOUS CATHETER PLACEMENT     removed    Prior to Admission medications   Medication Sig Start Date End Date Taking? Authorizing Provider  albuterol (PROVENTIL HFA;VENTOLIN HFA) 108 (90 Base) MCG/ACT inhaler Inhale 2 puffs into the lungs every 6 (six) hours as needed for wheezing or shortness of breath.    [provider]  albuterol (PROVENTIL) (2.5 MG/3ML) 0.083% nebulizer solution Take 2.5 mg by nebulization every 6 (six) hours as needed for wheezing or shortness of breath.    [provider]  aspirin EC 81 MG tablet Take 1 tablet (81 mg total) by mouth daily. 03/28/18   Theora Gianotti, NP  BREO ELLIPTA 200-25 MCG/INH AEPB INHALE 1 PUFF INTO THE LUNGS DAILY 06/08/18   Poulose, Bethel Born, NP  carvedilol (COREG) 6.25 MG tablet Take 1 tablet (6.25 mg total) by mouth 2 (two) times daily. 05/12/18   [name], MD  clopidogrel (PLAVIX) 75 MG tablet Take 1 tablet (75 mg total) by mouth daily with breakfast. 04/27/18   Theora Gianotti, NP  cyanocobalamin 1000 MCG tablet Take 1,000 mcg by mouth daily. am    [provider]  cyclobenzaprine (FLEXERIL) 10 MG tablet Take 1 tablet (10 mg total) by mouth 2 (two) times daily. 07/07/18   Molli Barrows, MD  dexlansoprazole (DEXILANT) 60 MG capsule Take 1 capsule (60 mg total) by mouth daily. 06/29/18   [name], MD  ezetimibe (ZETIA) 10 MG tablet Take 1 tablet (10 mg total) by mouth  daily. 04/11/18 07/10/18  Theora Gianotti, NP  furosemide (LASIX) 40 MG tablet Take 1/2 tablet (20 mg) by mouth once every other day 06/24/18   [name], MD  gabapentin (NEURONTIN) 300 MG capsule Take 1 capsule (300 mg total) by mouth 3 (three) times daily. 09/25/15   Roselee Nova, MD  gabapentin (NEURONTIN) 800 MG tablet Take 1 tablet (800 mg total) by mouth at bedtime. 09/25/15   Roselee Nova, MD  insulin lispro (HUMALOG KWIKPEN) 100 UNIT/ML KiwkPen Inject 7-8 Units into the skin 3 (three) times daily. Reported on 12/02/2015/ sliding scale 1 unit for every 8 units of carbs; and 1 unit for every 20 above 120    [provider]  Insulin Pen Needle (FIFTY50 PEN NEEDLES) 32G X 4 MM MISC Inject 1 each into the skin as needed. 02/24/18   [provider]  LANTUS SOLOSTAR 100 UNIT/ML Solostar Pen Inject 33-35 Units into the skin daily  at 10 pm. 03/25/16   Sherri Sear, MD  levocetirizine (XYZAL) 5 MG tablet Take 1 tablet (5 mg total) by mouth every evening. 06/07/18   Poulose, Bethel Born, NP  liraglutide (VICTOZA) 18 MG/3ML SOPN 1.8 units every morning 04/26/18   Theora Gianotti, NP  loratadine (CLARITIN) 10 MG tablet Take 10 mg by mouth daily. am    [provider]  metFORMIN (GLUCOPHAGE) 1000 MG tablet Take 1,000 mg by mouth 2 (two) times daily with a meal.    Sherri Sear, MD  montelukast (SINGULAIR) 10 MG tablet Take 10 mg by mouth at bedtime. 04/01/16   Erby Pian, MD  naloxone [facility]) nasal spray 4 mg/0.1 mL For excess sedation from opioids 09/15/16   Molli Barrows, MD  nitroGLYCERIN (NITROSTAT) 0.3 MG SL tablet Place 1 tablet (0.3 mg total) under the tongue every 5 (five) minutes as needed for chest pain. Reported on 12/02/2015 01/25/17   [name], MD  Oxycodone HCl 10 MG TABS Take 1 tablet (10 mg total) by mouth 4 (four) times daily as needed. 07/07/18   Molli Barrows, MD  pyridostigmine (MESTINON) 60 MG tablet  Take 60 mg by mouth 4 (four) times daily.    Anabel Bene, MD  rosuvastatin (CRESTOR) 5 MG tablet Take 5 mg by mouth daily. 11/20/17   [name], MD  sacubitril-valsartan (ENTRESTO) 24-26 MG Take 1 tablet by mouth 2 (two) times daily. 04/26/18   Theora Gianotti, NP  triamcinolone cream (KENALOG) 0.1 % apply to affected area twice a day [place] as needed 04/09/17   [provider]    Allergies Metoprolol and Novolog [insulin aspart]  Family History  Problem Relation Age of Onset  . Diabetes Mother   . Heart disease Mother   . Cancer Father        Prostate CA  . Diabetes Brother   . Healthy Brother   . Healthy Brother     Social History Social History   Tobacco Use  . Smoking status: Never Smoker  . Smokeless tobacco: Never Used  . Tobacco comment: smoking cessation materials not required  Substance Use Topics  . Alcohol use: Not Currently    Alcohol/week: 0.0 standard drinks  . Drug use: No    Review of Systems Unable to obtain secondary to altered mental status ____________________________________________   PHYSICAL EXAM:  VITAL SIGNS: ED Triage Vitals  Enc Vitals Group     BP 07/07/18 2003 115/65     Pulse Rate 07/07/18 2003 (!) 143     Resp 07/07/18 2003 (!) 33     Temp 07/07/18 2003 (!) 103.1 F (39.5 C)     Temp Source 07/07/18 2003 Oral     SpO2 07/07/18 2003 91 %     Weight 07/07/18 2006 237 lb (107.5 kg)     Height 07/07/18 2006 5\' 8"  (1.727 m)     Head Circumference --      Peak Flow --      Pain Score 07/07/18 2006 0   Constitutional: Awake and alert.  Oriented to name only Eyes: Conjunctivae are normal.  ENT      Head: Normocephalic and atraumatic.      Nose: No congestion/rhinnorhea.      Mouth/Throat: Mucous membranes are moist.      Neck: No stridor. Hematological/Lymphatic/Immunilogical: No cervical lymphadenopathy. Cardiovascular: Tachycardic, regular rhythm.  No murmurs, rubs, or gallops.  Respiratory:  Tachypneic, diffuse expiratory wheezing Gastrointestinal:  Soft and non tender. No rebound. No guarding.  Genitourinary: Deferred Musculoskeletal: Normal range of motion in all extremities. No lower extremity edema. Neurologic: Oriented to name only.  Appears to move all extremities.  Able to follow commands. Skin:  Skin is warm, dry and intact. No rash noted.  ____________________________________________    LABS (pertinent positives/negatives)  CBC wbc 1.7, hgb 11.9, plt 105 UA cloudy, moderae hgb dipstick, rbc 21-50 CMP na 138, k 4.0, glu 275, cr 1.74 Lactic 2.5 ____________________________________________   EKG  I, Nance Pear, attending physician, personally viewed and interpreted this EKG  EKG Time: 2001 Rate: 143 Rhythm: sinus tachycardia Axis: rightward axis deviation Intervals: qtc 414 QRS: narrow, q waves v1, v2, II, III ST changes: no st elevation Impression: abnormal ekg   ____________________________________________    RADIOLOGY  CXR No acute disease  CT ab/pel Concern for colitis ____________________________________________   PROCEDURES  Procedures  CRITICAL CARE Performed by: Nance Pear   Total critical care time: 40 minutes  Critical care time was exclusive of separately billable procedures and treating other patients.  Critical care was necessary to treat or prevent imminent or life-threatening deterioration.  Critical care was time spent personally by me on the following activities: development of treatment plan with patient and/or surrogate as well as nursing, discussions with consultants, evaluation of patient's response to treatment, examination of patient, obtaining history from patient or surrogate, ordering and performing treatments and interventions, ordering and review of laboratory studies, ordering and review of radiographic studies, pulse oximetry and re-evaluation of patient's  condition.  ____________________________________________   INITIAL IMPRESSION / ASSESSMENT AND PLAN / ED COURSE  Pertinent labs & imaging results that were available during my care of the patient were reviewed by me and considered in my medical decision making (see chart for details).   Patient presented to the emergency department today because of concerns for altered mental status.  Initial vital signs concerning for fever and possible sepsis.  Because of this patient was given broad-spectrum antibiotics.  Patient had broad work-up initiated.  Chest x-ray and urine without obvious source of infection.  Family did state that the patient did have episode of vomiting earlier today.  Because of this CT abdomen pelvis was obtained.  This showed possible colitis.  Patient's mental status did improve during his course here in the emergency department.  He became oriented to time.  Will plan on admission.  Discussed findings and plan with patient and patient's family.  ____________________________________________   FINAL CLINICAL IMPRESSION(S) / ED DIAGNOSES  Final diagnoses:  Altered mental status, unspecified altered mental status type  Fever, unspecified fever cause     Note: This dictation was prepared with Dragon dictation. Any transcriptional errors that result from this process are unintentional     Nance Pear, MD 07/08/18 [phone]

## 2018-07-07 NOTE — ED Notes (Addendum)
Pt is A&OX x1. Pt stated that it was year 1985 and when asked what month it was he was not able to answer it and was not able to answer questions about where he was at.

## 2018-07-08 ENCOUNTER — Encounter: Payer: Self-pay | Admitting: *Deleted

## 2018-07-08 ENCOUNTER — Inpatient Hospital Stay (HOSPITAL_COMMUNITY)
Admit: 2018-07-08 | Discharge: 2018-07-08 | Disposition: A | Discharge status: Home / Self Care | Payer: Medicare Other | Attending: Critical Care Medicine | Admitting: Critical Care Medicine

## 2018-07-08 ENCOUNTER — Other Ambulatory Visit: Payer: Self-pay

## 2018-07-08 ENCOUNTER — Inpatient Hospital Stay: Payer: Medicare Other

## 2018-07-08 DIAGNOSIS — E118 Type 2 diabetes mellitus with unspecified complications: Secondary | ICD-10-CM

## 2018-07-08 DIAGNOSIS — R4182 Altered mental status, unspecified: Secondary | ICD-10-CM

## 2018-07-08 DIAGNOSIS — I255 Ischemic cardiomyopathy: Secondary | ICD-10-CM | POA: Diagnosis present

## 2018-07-08 DIAGNOSIS — N170 Acute kidney failure with tubular necrosis: Secondary | ICD-10-CM | POA: Diagnosis present

## 2018-07-08 DIAGNOSIS — J9601 Acute respiratory failure with hypoxia: Secondary | ICD-10-CM | POA: Diagnosis present

## 2018-07-08 DIAGNOSIS — I5042 Chronic combined systolic (congestive) and diastolic (congestive) heart failure: Secondary | ICD-10-CM

## 2018-07-08 DIAGNOSIS — K529 Noninfective gastroenteritis and colitis, unspecified: Secondary | ICD-10-CM | POA: Diagnosis present

## 2018-07-08 DIAGNOSIS — G4733 Obstructive sleep apnea (adult) (pediatric): Secondary | ICD-10-CM | POA: Diagnosis present

## 2018-07-08 DIAGNOSIS — E1122 Type 2 diabetes mellitus with diabetic chronic kidney disease: Secondary | ICD-10-CM | POA: Diagnosis present

## 2018-07-08 DIAGNOSIS — Z955 Presence of coronary angioplasty implant and graft: Secondary | ICD-10-CM | POA: Diagnosis not present

## 2018-07-08 DIAGNOSIS — I5030 Unspecified diastolic (congestive) heart failure: Secondary | ICD-10-CM

## 2018-07-08 DIAGNOSIS — Z9989 Dependence on other enabling machines and devices: Secondary | ICD-10-CM

## 2018-07-08 DIAGNOSIS — M5136 Other intervertebral disc degeneration, lumbar region: Secondary | ICD-10-CM | POA: Diagnosis present

## 2018-07-08 DIAGNOSIS — I25118 Atherosclerotic heart disease of native coronary artery with other forms of angina pectoris: Secondary | ICD-10-CM

## 2018-07-08 DIAGNOSIS — R6520 Severe sepsis without septic shock: Secondary | ICD-10-CM

## 2018-07-08 DIAGNOSIS — A419 Sepsis, unspecified organism: Principal | ICD-10-CM | POA: Diagnosis present

## 2018-07-08 DIAGNOSIS — I214 Non-ST elevation (NSTEMI) myocardial infarction: Secondary | ICD-10-CM

## 2018-07-08 DIAGNOSIS — R000 Tachycardia, unspecified: Secondary | ICD-10-CM | POA: Diagnosis not present

## 2018-07-08 DIAGNOSIS — I252 Old myocardial infarction: Secondary | ICD-10-CM | POA: Diagnosis not present

## 2018-07-08 DIAGNOSIS — N189 Chronic kidney disease, unspecified: Secondary | ICD-10-CM | POA: Diagnosis present

## 2018-07-08 DIAGNOSIS — I25110 Atherosclerotic heart disease of native coronary artery with unstable angina pectoris: Secondary | ICD-10-CM | POA: Diagnosis present

## 2018-07-08 DIAGNOSIS — D649 Anemia, unspecified: Secondary | ICD-10-CM | POA: Diagnosis present

## 2018-07-08 DIAGNOSIS — E1165 Type 2 diabetes mellitus with hyperglycemia: Secondary | ICD-10-CM

## 2018-07-08 DIAGNOSIS — G9341 Metabolic encephalopathy: Secondary | ICD-10-CM | POA: Diagnosis present

## 2018-07-08 DIAGNOSIS — D696 Thrombocytopenia, unspecified: Secondary | ICD-10-CM | POA: Diagnosis present

## 2018-07-08 DIAGNOSIS — N179 Acute kidney failure, unspecified: Secondary | ICD-10-CM | POA: Diagnosis present

## 2018-07-08 DIAGNOSIS — I130 Hypertensive heart and chronic kidney disease with heart failure and stage 1 through stage 4 chronic kidney disease, or unspecified chronic kidney disease: Secondary | ICD-10-CM | POA: Diagnosis present

## 2018-07-08 DIAGNOSIS — G894 Chronic pain syndrome: Secondary | ICD-10-CM | POA: Diagnosis present

## 2018-07-08 DIAGNOSIS — E1142 Type 2 diabetes mellitus with diabetic polyneuropathy: Secondary | ICD-10-CM | POA: Diagnosis present

## 2018-07-08 DIAGNOSIS — N183 Chronic kidney disease, stage 3 (moderate): Secondary | ICD-10-CM

## 2018-07-08 DIAGNOSIS — E785 Hyperlipidemia, unspecified: Secondary | ICD-10-CM | POA: Diagnosis present

## 2018-07-08 DIAGNOSIS — R509 Fever, unspecified: Secondary | ICD-10-CM | POA: Diagnosis present

## 2018-07-08 DIAGNOSIS — E1169 Type 2 diabetes mellitus with other specified complication: Secondary | ICD-10-CM | POA: Diagnosis present

## 2018-07-08 LAB — BASIC METABOLIC PANEL
Anion gap: 14 (ref 5–15)
BUN: 39 mg/dL — AB (ref 6–20)
CHLORIDE: 108 mmol/L (ref 98–111)
CO2: 20 mmol/L — ABNORMAL LOW (ref 22–32)
CREATININE: 1.98 mg/dL — AB (ref 0.61–1.24)
Calcium: 8.6 mg/dL — ABNORMAL LOW (ref 8.9–10.3)
GFR calc Af Amer: 41 mL/min — ABNORMAL LOW (ref >60)
GFR, EST NON AFRICAN AMERICAN: 35 mL/min — AB (ref >60)
GLUCOSE: 248 mg/dL — AB (ref 70–99)
Potassium: 4.2 mmol/L (ref 3.5–5.1)
SODIUM: 142 mmol/L (ref 135–145)

## 2018-07-08 LAB — TROPONIN I
TROPONIN I: 11.48 ng/mL — AB (ref <0.03)
Troponin I: 9.32 ng/mL (ref <0.03)
Troponin I: 7.08 ng/mL (ref <0.03)

## 2018-07-08 LAB — HEPARIN LEVEL (UNFRACTIONATED)
HEPARIN UNFRACTIONATED: 0.23 [IU]/mL — AB (ref 0.30–0.70)
Heparin Unfractionated: 0.27 IU/mL — ABNORMAL LOW (ref 0.30–0.70)

## 2018-07-08 LAB — MRSA PCR SCREENING: MRSA by PCR: NEGATIVE

## 2018-07-08 LAB — GLUCOSE, CAPILLARY
GLUCOSE-CAPILLARY: 167 mg/dL — AB (ref 70–99)
GLUCOSE-CAPILLARY: 253 mg/dL — AB (ref 70–99)
Glucose-Capillary: 307 mg/dL — ABNORMAL HIGH (ref 70–99)
Glucose-Capillary: 302 mg/dL — ABNORMAL HIGH (ref 70–99)
Glucose-Capillary: 318 mg/dL — ABNORMAL HIGH (ref 70–99)

## 2018-07-08 LAB — ECHOCARDIOGRAM COMPLETE
Height: 68 in
Weight: 3792 oz

## 2018-07-08 LAB — CBC
HEMATOCRIT: 34.1 % — AB (ref 40.0–52.0)
Hemoglobin: 11.6 g/dL — ABNORMAL LOW (ref 13.0–18.0)
MCH: 30.4 pg (ref 26.0–34.0)
MCHC: 34 g/dL (ref 32.0–36.0)
MCV: 89.5 fL (ref 80.0–100.0)
PLATELETS: 122 10*3/uL — AB (ref 150–440)
RBC: 3.81 MIL/uL — ABNORMAL LOW (ref 4.40–5.90)
RDW: 15.3 % — AB (ref 11.5–14.5)
WBC: 5.7 10*3/uL (ref 3.8–10.6)

## 2018-07-08 LAB — LACTIC ACID, PLASMA
LACTIC ACID, VENOUS: 3.3 mmol/L — AB (ref 0.5–1.9)
LACTIC ACID, VENOUS: 3 mmol/L — AB (ref 0.5–1.9)
LACTIC ACID, VENOUS: 2.8 mmol/L — AB (ref 0.5–1.9)

## 2018-07-08 LAB — PROCALCITONIN: Procalcitonin: 105.82 ng/mL

## 2018-07-08 MED ORDER — INSULIN GLARGINE 100 UNIT/ML ~~LOC~~ SOLN
30.0000 [IU] | Freq: Once | SUBCUTANEOUS | Status: AC
  Administered 2018-07-08: 30 [IU] via SUBCUTANEOUS
  Filled 2018-07-08: qty 0.3

## 2018-07-08 MED ORDER — INSULIN GLARGINE 100 UNIT/ML ~~LOC~~ SOLN
10.0000 [IU] | Freq: Every day | SUBCUTANEOUS | Status: DC
  Administered 2018-07-09 – 2018-07-10 (×2): 10 [IU] via SUBCUTANEOUS
  Filled 2018-07-08 (×4): qty 0.1

## 2018-07-08 MED ORDER — ONDANSETRON HCL 4 MG PO TABS
4.0000 mg | ORAL_TABLET | Freq: Four times a day (QID) | ORAL | Status: DC | PRN
  Administered 2018-07-11 (×2): 4 mg via ORAL
  Filled 2018-07-08 (×2): qty 1

## 2018-07-08 MED ORDER — INSULIN ASPART 100 UNIT/ML ~~LOC~~ SOLN: 0.0000 [IU] | Freq: Every day | SUBCUTANEOUS | Status: DC

## 2018-07-08 MED ORDER — OXYCODONE HCL 5 MG PO TABS
10.0000 mg | ORAL_TABLET | Freq: Four times a day (QID) | ORAL | Status: DC | PRN
  Administered 2018-07-08 – 2018-07-13 (×18): 10 mg via ORAL
  Filled 2018-07-08 (×18): qty 2

## 2018-07-08 MED ORDER — ONDANSETRON HCL 4 MG/2ML IJ SOLN
4.0000 mg | Freq: Four times a day (QID) | INTRAMUSCULAR | Status: DC | PRN
  Administered 2018-07-10 (×2): 4 mg via INTRAVENOUS
  Filled 2018-07-08 (×2): qty 2

## 2018-07-08 MED ORDER — ASPIRIN EC 81 MG PO TBEC
81.0000 mg | DELAYED_RELEASE_TABLET | Freq: Every day | ORAL | Status: DC
  Administered 2018-07-08 – 2018-07-13 (×6): 81 mg via ORAL
  Filled 2018-07-08 (×6): qty 1

## 2018-07-08 MED ORDER — SODIUM CHLORIDE 0.9 % IV SOLN: INTRAVENOUS | Status: DC

## 2018-07-08 MED ORDER — ACETAMINOPHEN 650 MG RE SUPP
RECTAL | Status: AC
  Filled 2018-07-08: qty 1

## 2018-07-08 MED ORDER — INSULIN ASPART 100 UNIT/ML ~~LOC~~ SOLN: 0.0000 [IU] | Freq: Three times a day (TID) | SUBCUTANEOUS | Status: DC

## 2018-07-08 MED ORDER — INSULIN LISPRO 100 UNIT/ML ~~LOC~~ SOLN
0.0000 [IU] | SUBCUTANEOUS | Status: DC
  Administered 2018-07-09 (×3): 8 [IU] via SUBCUTANEOUS
  Administered 2018-07-09: 11 [IU] via SUBCUTANEOUS
  Administered 2018-07-10 (×2): 8 [IU] via SUBCUTANEOUS
  Administered 2018-07-10 (×2): 5 [IU] via SUBCUTANEOUS
  Administered 2018-07-10 (×2): 2 [IU] via SUBCUTANEOUS
  Administered 2018-07-11 (×2): 5 [IU] via SUBCUTANEOUS
  Administered 2018-07-11: 3 [IU] via SUBCUTANEOUS
  Administered 2018-07-11: 11 [IU] via SUBCUTANEOUS
  Administered 2018-07-11: 3 [IU] via SUBCUTANEOUS
  Administered 2018-07-11: 5 [IU] via SUBCUTANEOUS
  Filled 2018-07-08: qty 10

## 2018-07-08 MED ORDER — HEPARIN (PORCINE) IN NACL 100-0.45 UNIT/ML-% IJ SOLN
1850.0000 [IU]/h | INTRAMUSCULAR | Status: DC
  Administered 2018-07-08: 1100 [IU]/h via INTRAVENOUS
  Administered 2018-07-09 (×2): 1650 [IU]/h via INTRAVENOUS
  Filled 2018-07-08 (×3): qty 250

## 2018-07-08 MED ORDER — CARVEDILOL 6.25 MG PO TABS: 6.2500 mg | ORAL_TABLET | Freq: Two times a day (BID) | ORAL | Status: DC

## 2018-07-08 MED ORDER — ACETAMINOPHEN 325 MG PO TABS: 650.0000 mg | ORAL_TABLET | Freq: Four times a day (QID) | ORAL | Status: DC | PRN

## 2018-07-08 MED ORDER — ACETAMINOPHEN 650 MG RE SUPP: 650.0000 mg | Freq: Four times a day (QID) | RECTAL | Status: DC | PRN

## 2018-07-08 MED ORDER — MONTELUKAST SODIUM 10 MG PO TABS
10.0000 mg | ORAL_TABLET | Freq: Every day | ORAL | Status: DC
  Administered 2018-07-09 – 2018-07-12 (×4): 10 mg via ORAL
  Filled 2018-07-08 (×4): qty 1

## 2018-07-08 MED ORDER — ACETAMINOPHEN 650 MG RE SUPP
650.0000 mg | RECTAL | Status: DC | PRN
  Administered 2018-07-08: 650 mg via RECTAL

## 2018-07-08 MED ORDER — ROSUVASTATIN CALCIUM 10 MG PO TABS
5.0000 mg | ORAL_TABLET | Freq: Every day | ORAL | Status: DC
  Administered 2018-07-09 – 2018-07-13 (×5): 5 mg via ORAL
  Filled 2018-07-08 (×5): qty 1

## 2018-07-08 MED ORDER — HEPARIN SODIUM (PORCINE) 5000 UNIT/ML IJ SOLN
5000.0000 [IU] | Freq: Three times a day (TID) | INTRAMUSCULAR | Status: DC
  Administered 2018-07-08: 5000 [IU] via SUBCUTANEOUS
  Filled 2018-07-08: qty 1

## 2018-07-08 MED ORDER — PYRIDOSTIGMINE BROMIDE 60 MG PO TABS
60.0000 mg | ORAL_TABLET | Freq: Four times a day (QID) | ORAL | Status: DC
  Administered 2018-07-08 – 2018-07-13 (×18): 60 mg via ORAL
  Filled 2018-07-08 (×24): qty 1

## 2018-07-08 MED ORDER — PANTOPRAZOLE SODIUM 40 MG PO TBEC
40.0000 mg | DELAYED_RELEASE_TABLET | Freq: Every day | ORAL | Status: DC
  Administered 2018-07-09 – 2018-07-13 (×5): 40 mg via ORAL
  Filled 2018-07-08 (×5): qty 1

## 2018-07-08 MED ORDER — FLUTICASONE FUROATE-VILANTEROL 200-25 MCG/INH IN AEPB
1.0000 | INHALATION_SPRAY | Freq: Every day | RESPIRATORY_TRACT | Status: DC
  Administered 2018-07-10 – 2018-07-13 (×4): 1 via RESPIRATORY_TRACT
  Filled 2018-07-08: qty 28

## 2018-07-08 MED ORDER — SODIUM CHLORIDE 0.9 % IV SOLN
INTRAVENOUS | Status: AC
  Administered 2018-07-08: 05:00:00 via INTRAVENOUS

## 2018-07-08 MED ORDER — CLOPIDOGREL BISULFATE 75 MG PO TABS
75.0000 mg | ORAL_TABLET | Freq: Every day | ORAL | Status: DC
  Administered 2018-07-08 – 2018-07-13 (×6): 75 mg via ORAL
  Filled 2018-07-08 (×6): qty 1

## 2018-07-08 MED ORDER — SACUBITRIL-VALSARTAN 24-26 MG PO TABS
1.0000 | ORAL_TABLET | Freq: Two times a day (BID) | ORAL | Status: DC
  Filled 2018-07-08 (×2): qty 1

## 2018-07-08 MED ORDER — SODIUM CHLORIDE 0.9 % IV SOLN
2.0000 g | Freq: Two times a day (BID) | INTRAVENOUS | Status: DC
  Administered 2018-07-08 – 2018-07-11 (×7): 2 g via INTRAVENOUS
  Filled 2018-07-08 (×9): qty 2

## 2018-07-08 NOTE — Consult Note (Signed)
PULMONARY / CRITICAL CARE MEDICINE   Name: [name] MRN: [id number] DOB: October 26, 1960    ADMISSION DATE:  07/07/2018 CONSULTATION DATE:  07/08/2018  REFERRING MD:  Dr. Jannifer Franklin  CHIEF COMPLAINT:  Altered Mental Status, Sepsis  BRIEF DISCUSSION: 57 y.o Male admitted with Sepsis secondary to Colitis of the Sigmoid Colon.  HISTORY OF PRESENT ILLNESS:   [name] is a 57 y.o. Male with a PMH as listed below who presents to [facility] ED on 07/07/18 with c/o lethargy, confusion, and fever. Pt is a poor historian given his confusion and there is no family currently present, therefore history is obtained from ED and nursing notes. The patient's sons  report that their father was very lethargic on 10/3, with confusion and some slurred speech, prompting them to call EMS.  Upon arrival to the ED, pt was noted to be febrile with Temperature 103, Tachycardic and Hypoxic with O2 sats 85% on room air.  Initial workup in the ED revealed Lactic Acid 2.5, WBC 1.7, Creatinine 1.74. UA negative for UTI, and CXR negative for acute cardiopulmonary disease.  CT Abdomen & Pelvis was obtained which was concerning for suspected colitis of the sigmoid colon.   He is being admitted to [facility] unit for treatment of Sepsis secondary to Colitis.  PCCM is consulted for further management.  PAST MEDICAL HISTORY :  He  has a past medical history of Allergy, Arthritis, Asthma, Cataract, CHF (congestive heart failure) ([place]), Coronary artery disease, Diabetes mellitus without complication ([place]), Diabetic peripheral neuropathy ([place]), GERD (gastroesophageal reflux disease), Headache, Hyperlipidemia, Hypertension, Insomnia, Ischemic cardiomyopathy, Lyme disease, Myasthenia gravis ([place]), Myocardial infarction ([place]) (2010), Seasonal allergies, and Sleep apnea.  PAST SURGICAL HISTORY: He  has a past surgical history that includes Tonsillectomy and adenoidectomy; Bilateral carpal tunnel release (Bilateral, L in 2012 and R in 2013);  Cardiac catheterization; Coronary angioplasty; Eye surgery (Bilateral, 2012); Tunneled venous catheter placement; Colonoscopy with propofol (N/A, 01/10/2016); Esophagogastroduodenoscopy (egd) with propofol (N/A, 01/10/2016); LEFT HEART CATH AND CORONARY ANGIOGRAPHY (Left, 04/25/2018); and CORONARY STENT INTERVENTION (N/A, 04/25/2018).  Allergies  Allergen Reactions  . Metoprolol Rash  . Novolog [Insulin Aspart] Hives    No current facility-administered medications on file prior to encounter.    Current Outpatient Medications on File Prior to Encounter  Medication Sig  . albuterol (PROVENTIL HFA;VENTOLIN HFA) 108 (90 Base) MCG/ACT inhaler Inhale 2 puffs into the lungs every 6 (six) hours as needed for wheezing or shortness of breath.  [name] albuterol (PROVENTIL) (2.5 MG/3ML) 0.083% nebulizer solution Take 2.5 mg by nebulization every 6 (six) hours as needed for wheezing or shortness of breath.  [name] aspirin EC 81 MG tablet Take 1 tablet (81 mg total) by mouth daily.  [name] BREO ELLIPTA 200-25 MCG/INH AEPB INHALE 1 PUFF INTO THE LUNGS DAILY (Patient taking differently: Inhale 1 puff into the lungs daily. )  . carvedilol (COREG) 6.25 MG tablet Take 1 tablet (6.25 mg total) by mouth 2 (two) times daily.  . clopidogrel (PLAVIX) 75 MG tablet Take 1 tablet (75 mg total) by mouth daily with breakfast.  . cyanocobalamin 1000 MCG tablet Take 1,000 mcg by mouth daily.   . cyclobenzaprine (FLEXERIL) 10 MG tablet Take 1 tablet (10 mg total) by mouth 2 (two) times daily. (Patient taking differently: Take 10 mg by mouth 2 (two) times daily as needed for muscle spasms. )  . dexlansoprazole (DEXILANT) 60 MG capsule Take 1 capsule (60 mg total) by mouth daily.  [name] ezetimibe (ZETIA) 10 MG tablet  Take 1 tablet (10 mg total) by mouth daily.  [name] gabapentin (NEURONTIN) 300 MG capsule Take 1 capsule (300 mg total) by mouth 3 (three) times daily.  [name] gabapentin (NEURONTIN) 800 MG tablet Take 1 tablet (800 mg total) by mouth at bedtime.   . insulin lispro (HUMALOG KWIKPEN) 100 UNIT/ML KiwkPen Inject 7-8 Units into the skin 3 (three) times daily. Reported on 12/02/2015/ sliding scale 1 unit for every 8 units of carbs; and 1 unit for every 20 above 120  . LANTUS SOLOSTAR 100 UNIT/ML Solostar Pen Inject 25-30 Units into the skin daily at 10 pm.   . levocetirizine (XYZAL) 5 MG tablet Take 1 tablet (5 mg total) by mouth every evening.  . liraglutide (VICTOZA) 18 MG/3ML SOPN 1.8 units every morning (Patient taking differently: Inject 1.8 mg into the skin daily. )  . loratadine (CLARITIN) 10 MG tablet Take 10 mg by mouth daily.   . metFORMIN (GLUCOPHAGE) 1000 MG tablet Take 1,000 mg by mouth 2 (two) times daily with a meal.  . montelukast (SINGULAIR) 10 MG tablet Take 10 mg by mouth at bedtime.  . Oxycodone HCl 10 MG TABS Take 1 tablet (10 mg total) by mouth 4 (four) times daily as needed. (Patient taking differently: Take 10 mg by mouth 4 (four) times daily as needed (pain). )  . pyridostigmine (MESTINON) 60 MG tablet Take 60 mg by mouth 4 (four) times daily.  . rosuvastatin (CRESTOR) 5 MG tablet Take 5 mg by mouth daily.  . sacubitril-valsartan (ENTRESTO) 24-26 MG Take 1 tablet by mouth 2 (two) times daily.  [name] triamcinolone cream (KENALOG) 0.1 % Apply 1 application topically 2 (two) times daily as needed (rash).   . furosemide (LASIX) 40 MG tablet Take 1/2 tablet (20 mg) by mouth once every other day (Patient not taking: Reported on 07/07/2018)  . Insulin Pen Needle (FIFTY50 PEN NEEDLES) 32G X 4 MM MISC Inject 1 each into the skin as needed.  . naloxone (NARCAN) nasal spray 4 mg/0.1 mL For excess sedation from opioids  . nitroGLYCERIN (NITROSTAT) 0.3 MG SL tablet Place 1 tablet (0.3 mg total) under the tongue every 5 (five) minutes as needed for chest pain. Reported on 12/02/2015    FAMILY HISTORY:  His He indicated that his mother is deceased. He indicated that his father is alive. He indicated that his sister is alive. He indicated  that three of his four brothers are alive.   SOCIAL HISTORY: He  reports that he has never smoked. He has never used smokeless tobacco. He reports that he drank alcohol. He reports that he does not use drugs.  REVIEW OF SYSTEMS:   Positives in BOLD: Gen: Denies fever, +chills, weight change, fatigue, night sweats HEENT: Denies blurred vision, double vision, hearing loss, tinnitus, sinus congestion, rhinorrhea, sore throat, neck stiffness, dysphagia PULM: Denies shortness of breath, cough, sputum production, hemoptysis, wheezing CV: Denies chest pain, edema, orthopnea, paroxysmal nocturnal dyspnea, palpitations GI: Denies +abdominal tenderness, nausea, vomiting, diarrhea, hematochezia, melena, constipation, change in bowel habits GU: Denies dysuria, hematuria, polyuria, oliguria, urethral discharge Endocrine: Denies hot or cold intolerance, polyuria, polyphagia or appetite change Derm: Denies rash, dry skin, scaling or peeling skin change Heme: Denies easy bruising, bleeding, bleeding gums Neuro: Denies headache, numbness, weakness, slurred speech, loss of memory or consciousness   SUBJECTIVE:  Denies Shortness of breath, chest pain, palpitations, or edema Reports abdominal tenderness upon palpation  VITAL SIGNS: BP 113/62   Pulse (!) 134   Temp (!) 100.6  F (38.1 C) (Oral)   Resp (!) 25   Ht 5\' 8"  (1.727 m)   Wt 107.5 kg   SpO2 96%   BMI 36.04 kg/m   HEMODYNAMICS:    VENTILATOR SETTINGS:    INTAKE / OUTPUT: No intake/output data recorded.  PHYSICAL EXAMINATION: General:  Acutely ill appearing male, laying in bed, in NAD Neuro:  Awake, alert, disoriented to situation, follows commands, no focal deficits, Pupils PERRLA 3 mm brisk bilaterally HEENT:  Atraumatic, normocephalic, neck supple, no JVD Cardiovascular:  Tachycardia, regular rhythm, s1s2, no M/R/G, 2+ pulses bilaterally Lungs:  Clear bilaterally, even, mild use of assessory muscles Abdomen:  Obese, soft,  tender, BS+ x4 Musculoskeletal:  No deformities, no edema Skin:  Warm/dry.  No obvious rashes, lesions, or ulcerations  LABS:  BMET Recent Labs  Lab 07/07/18 2005  NA 138  K 4.0  CL 104  CO2 25  BUN 36*  CREATININE 1.74*  GLUCOSE 275*    Electrolytes Recent Labs  Lab 07/07/18 2005  CALCIUM 8.7*    CBC Recent Labs  Lab 07/07/18 2005  WBC 1.7*  HGB 11.9*  HCT 34.4*  PLT 105*    Coag's Recent Labs  Lab 07/07/18 2005  INR 1.26    Sepsis Markers Recent Labs  Lab 07/07/18 2005 07/08/18 0002 07/08/18 0420  LATICACIDVEN 2.5* 2.8* 3.3*    ABG No results for input(s): PHART, PCO2ART, PO2ART in the last 168 hours.  Liver Enzymes Recent Labs  Lab 07/07/18 2005  AST 40  ALT 30  ALKPHOS 86  BILITOT 1.4*  ALBUMIN 3.4*    Cardiac Enzymes No results for input(s): TROPONINI, PROBNP in the last 168 hours.  Glucose Recent Labs  Lab 07/07/18 2018  GLUCAP 229*    Imaging Dg Chest 1 View  Result Date: 07/07/2018 CLINICAL DATA:  Shortness of breath. Altered mental status. EXAM: CHEST  1 VIEW COMPARISON:  01/27/2018. FINDINGS: Poor inspiration. Normal sized heart. Coronary artery stent. Clear lungs with normal vascularity. Thoracic spine degenerative changes. IMPRESSION: No acute abnormality. Electronically Signed   By: Claudie Revering M.D.   On: 07/07/2018 20:48   Ct Abdomen Pelvis W Contrast  Result Date: 07/07/2018 CLINICAL DATA:  Vomiting, fever, and altered mental status. EXAM: CT ABDOMEN AND PELVIS WITH CONTRAST TECHNIQUE: Multidetector CT imaging of the abdomen and pelvis was performed using the standard protocol following bolus administration of intravenous contrast. CONTRAST:  42mL OMNIPAQUE IOHEXOL 300 MG/ML  SOLN COMPARISON:  None. FINDINGS: Lower chest: Breathing motion artifact. Subsegmental atelectasis in the right lower lobe. No confluent consolidation. No pleural fluid. There are coronary artery calcifications. Hepatobiliary: No focal hepatic  abnormality. Mild gallbladder distention, motion artifact through the gallbladder limits assessment for wall thickening. No calcified gallstone. No biliary dilatation. Pancreas: Completely fatty replaced. No ductal dilatation or inflammation. Spleen: Normal in size without focal abnormality. Adrenals/Urinary Tract: Normal adrenal glands. No hydronephrosis. Bilateral symmetric perinephric edema. Delayed excretion on delayed phase imaging. Urinary bladder is nondistended. Equivocal bladder wall thickening versus nondistention. Stomach/Bowel: Stomach physiologically distended. No small bowel wall thickening, inflammatory change or wall thickening, slight motion artifact limitations. Cecum is high-riding in the right mid abdomen. Normal appendix. Hepatic flexure through descending colon are nondistended, limiting assessment. Short segment proximal sigmoid colonic wall thickening with questionable pericolonic edema, image 71 series 2, partially obscured by motion. Moderate stool in the more distal sigmoid colon, stool distends the rectum which spans 7 cm. Vascular/Lymphatic: Moderate aortic and branch atherosclerosis. No enlarged lymph nodes in the  abdomen or pelvis. Reproductive: Central prostatic calcifications. Other: Small amount of free fluid in the pelvis, likely reactive. No free air or intra-abdominal abscess. Scattered soft tissue densities in the anterior abdominal wall most consistent with medication injection sites. Musculoskeletal: There are no acute or suspicious osseous abnormalities. IMPRESSION: 1. Short segment sigmoid colonic wall thickening with questionable pericolonic edema, suspicious for colitis. Motion artifact limits detailed assessment. Consider follow-up colonoscopy to exclude underlying neoplasm after resolution of acute event. Hepatic flexure through the distal descending colon are nondistended, limiting assessment for additional subtle colonic wall thickening. 2. Moderate colonic stool in  the sigmoid colon, stool distends the rectum. 3. Bilateral perinephric edema, often chronic and related to underlying renal dysfunction, recommend correlation with urinalysis to exclude urinary tract infection. 4. Complete fatty replacement of the pancreas, likely secondary to history of diabetes. 5. Aortic Atherosclerosis (ICD10-I70.0). Coronary artery calcifications. Electronically Signed   By: Keith Rake M.D.   On: 07/07/2018 23:32     STUDIES:  CT Abdomen & Pelvis 07/07/18>> Short segment sigmoid colonic wall thickening with questionablepericolonic edema, suspicious for colitis. Motion artifact limits detailed assessment. Consider follow-up colonoscopy to exclude underlying neoplasm after resolution of acute event. Hepatic flexure through the distal descending colon are nondistended, limiting assessment for additional subtle colonic wall thickening. Moderate colonic stool in the sigmoid colon, stool distends the rectum. Bilateral perinephric edema, often chronic and related to underlying renal dysfunction, recommend correlation with urinalysis to exclude urinary tract infection.  Complete fatty replacement of the pancreas, likely secondary tohistory of diabetes. Aortic Atherosclerosis   CULTURES: Blood cultures x2 07/07/18>>  ANTIBIOTICS: Cefepime 07/07/18>> Vancomycin 07/07/18>> Flagyl 07/08/18>>  SIGNIFICANT EVENTS: 07/08/2018>> Admission to [facility] Stepdown  LINES/TUBES:  ASSESSMENT / PLAN:  PULMONARY A: Acute Hypoxic Respiratory Failure  -CXR negative for acute disease Hx: Sleep apnea P:   Supplemental O2 prn to maintain O2 sats >92% BiPAP prn qHS Follow intermittent CXR Pulmonary hygiene  CARDIOVASCULAR A:  Tachycardia Hx: MI, HTN, HLD, CAD, CHF P:  Cardiac monitoring Maintain MAP >65 Received 1L LR bolus in ED  Gentle IVF: NS @ 50 ml/hr given pt's history of CHF Levophed if needed to maintain MAP >65 Trend Troponin Follow EKG  RENAL A:   AKI (Pre-renal) Lactic  acidosis P:   Monitor I&O's / urinary output Follow BMP Ensure adequate renal perfusion Avoid nephrotoxic agents as able Replace electrolytes as indicated Gentle IVF Trend Lactic acid  GASTROINTESTINAL A:   Acute Sigmoid Colitis Hx: GERD P:   NPO for now Abx as above Continue PO Protonix Consider Surgical & GI Consultation  HEMATOLOGIC A:   Anemia without signs of bleeding Thrombocytopenia  P:  Monitor for s/sx of bleeding Trend CBC Heparin SQ for VTE prophylaxis Transfuse for Hgb <7 Transfuse Platelets for Platelet count <50 and active bleeding  INFECTIOUS A:   Meets SIRS Criteria Sepsis secondary to Colitis Leukocytopenia P:   Monitor fever curve Trend WBCs Trend PCT Follow cultures as above Continue Vancomycin, Cefepime, & Flagyl  ENDOCRINE A:   Hyperglycemia in setting of DM   P:   CBG's  SSI Follow ICU Hypo/hyperglycemia protocol  NEUROLOGIC A:   Acute metabolic encephalopathy in setting of Sepsis Hx: Myasthenia gravis, insomnia, Diabetic neuropathy P:   Provide supportive care Avoid sedating meds as able Lights on during the day Promote normal wake/sleep cycle   FAMILY  - Updates: Updated pt at bedside 07/08/18.  - Inter-disciplinary family meet or Palliative Care meeting due by:  07/15/18  Darel Hong, AGACNP-BC [facility] Pager: [phone]   07/08/2018, 5:29 AM

## 2018-07-08 NOTE — Progress Notes (Signed)
*  PRELIMINARY RESULTS* Echocardiogram 2D Echocardiogram has been performed.  [name] 07/08/2018, 2:28 PM

## 2018-07-08 NOTE — ED Notes (Signed)
Report given to floor at this time.

## 2018-07-08 NOTE — Progress Notes (Signed)
[facility] [place] NAME: Eugune Sine    MR#:  [id number]  DATE OF BIRTH:  Apr 14, 1961  SUBJECTIVE:  patient came in with fever altered mental status and was found to have acute colitis. He is feeling a lot better. She is alert and oriented. No family in the room. No chest pain or shortness of breath.  REVIEW OF SYSTEMS:   Review of Systems  Constitutional: Negative for chills, fever and weight loss.  HENT: Negative for ear discharge, ear pain and nosebleeds.   Eyes: Negative for blurred vision, pain and discharge.  Respiratory: Negative for sputum production, shortness of breath, wheezing and stridor.   Cardiovascular: Negative for chest pain, palpitations, orthopnea and PND.  Gastrointestinal: Negative for abdominal pain, diarrhea, nausea and vomiting.  Genitourinary: Negative for frequency and urgency.  Musculoskeletal: Negative for back pain and joint pain.  Neurological: Negative for sensory change, speech change, focal weakness and weakness.  Psychiatric/Behavioral: Negative for depression and hallucinations. The patient is not nervous/anxious.    Tolerating Diet:clear Tolerating PT:   DRUG ALLERGIES:   Allergies  Allergen Reactions  . Metoprolol Rash  . Novolog [Insulin Aspart] Hives    VITALS:  Blood pressure (!) 83/55, pulse (!) 114, temperature 97.7 F (36.5 C), temperature source Oral, resp. rate (!) 23, height 5\' 8"  (1.727 m), weight 107.5 kg, SpO2 95 %.  PHYSICAL EXAMINATION:   Physical Exam  GENERAL:  57 y.o.-year-old patient lying in the bed with no acute distress.  EYES: Pupils equal, round, reactive to light and accommodation. No scleral icterus. Extraocular muscles intact.  HEENT: Head atraumatic, normocephalic. Oropharynx and nasopharynx clear.  NECK:  Supple, no jugular venous distention. No thyroid enlargement, no tenderness.  LUNGS: Normal breath sounds bilaterally, no wheezing, rales, rhonchi. No  use of accessory muscles of respiration.  CARDIOVASCULAR: S1, S2 normal. No murmurs, rubs, or gallops.  ABDOMEN: Soft, nontender, nondistended. Bowel sounds present. No organomegaly or mass.  EXTREMITIES: No cyanosis, clubbing or edema b/l.    NEUROLOGIC: Cranial nerves II through XII are intact. No focal Motor or sensory deficits b/l.   PSYCHIATRIC:  patient is alert and oriented x 3.  SKIN: No obvious rash, lesion, or ulcer.   LABORATORY PANEL:  CBC Recent Labs  Lab 07/08/18 0606  WBC 5.7  HGB 11.6*  HCT 34.1*  PLT 122*    Chemistries  Recent Labs  Lab 07/07/18 2005 07/08/18 0606  NA 138 142  K 4.0 4.2  CL 104 108  CO2 25 20*  GLUCOSE 275* 248*  BUN 36* 39*  CREATININE 1.74* 1.98*  CALCIUM 8.7* 8.6*  AST 40  --   ALT 30  --   ALKPHOS 86  --   BILITOT 1.4*  --    Cardiac Enzymes Recent Labs  Lab 07/08/18 0812  TROPONINI 11.48*   RADIOLOGY:  Dg Chest 1 View  Result Date: 07/07/2018 CLINICAL DATA:  Shortness of breath. Altered mental status. EXAM: CHEST  1 VIEW COMPARISON:  01/27/2018. FINDINGS: Poor inspiration. Normal sized heart. Coronary artery stent. Clear lungs with normal vascularity. Thoracic spine degenerative changes. IMPRESSION: No acute abnormality. Electronically Signed   By: Claudie Revering M.D.   On: 07/07/2018 20:48   Ct Head Wo Contrast  Result Date: 07/08/2018 CLINICAL DATA:  Slurred speech, lethargy and confusion today. EXAM: CT HEAD WITHOUT CONTRAST TECHNIQUE: Contiguous axial images were obtained from the base of the skull through the vertex without intravenous contrast. COMPARISON:  None. FINDINGS: Brain: No evidence of acute infarction, hemorrhage, hydrocephalus, extra-axial collection or mass lesion/mass effect. Vascular: No hyperdense vessel or unexpected calcification. Skull: Normal. Negative for fracture or focal lesion. Sinuses/Orbits: Negative. Other: None. IMPRESSION: Negative head CT. Electronically Signed   By: Inge Rise M.D.   On:  07/08/2018 12:42   Ct Abdomen Pelvis W Contrast  Result Date: 07/07/2018 CLINICAL DATA:  Vomiting, fever, and altered mental status. EXAM: CT ABDOMEN AND PELVIS WITH CONTRAST TECHNIQUE: Multidetector CT imaging of the abdomen and pelvis was performed using the standard protocol following bolus administration of intravenous contrast. CONTRAST:  27mL OMNIPAQUE IOHEXOL 300 MG/ML  SOLN COMPARISON:  None. FINDINGS: Lower chest: Breathing motion artifact. Subsegmental atelectasis in the right lower lobe. No confluent consolidation. No pleural fluid. There are coronary artery calcifications. Hepatobiliary: No focal hepatic abnormality. Mild gallbladder distention, motion artifact through the gallbladder limits assessment for wall thickening. No calcified gallstone. No biliary dilatation. Pancreas: Completely fatty replaced. No ductal dilatation or inflammation. Spleen: Normal in size without focal abnormality. Adrenals/Urinary Tract: Normal adrenal glands. No hydronephrosis. Bilateral symmetric perinephric edema. Delayed excretion on delayed phase imaging. Urinary bladder is nondistended. Equivocal bladder wall thickening versus nondistention. Stomach/Bowel: Stomach physiologically distended. No small bowel wall thickening, inflammatory change or wall thickening, slight motion artifact limitations. Cecum is high-riding in the right mid abdomen. Normal appendix. Hepatic flexure through descending colon are nondistended, limiting assessment. Short segment proximal sigmoid colonic wall thickening with questionable pericolonic edema, image 71 series 2, partially obscured by motion. Moderate stool in the more distal sigmoid colon, stool distends the rectum which spans 7 cm. Vascular/Lymphatic: Moderate aortic and branch atherosclerosis. No enlarged lymph nodes in the abdomen or pelvis. Reproductive: Central prostatic calcifications. Other: Small amount of free fluid in the pelvis, likely reactive. No free air or  intra-abdominal abscess. Scattered soft tissue densities in the anterior abdominal wall most consistent with medication injection sites. Musculoskeletal: There are no acute or suspicious osseous abnormalities. IMPRESSION: 1. Short segment sigmoid colonic wall thickening with questionable pericolonic edema, suspicious for colitis. Motion artifact limits detailed assessment. Consider follow-up colonoscopy to exclude underlying neoplasm after resolution of acute event. Hepatic flexure through the distal descending colon are nondistended, limiting assessment for additional subtle colonic wall thickening. 2. Moderate colonic stool in the sigmoid colon, stool distends the rectum. 3. Bilateral perinephric edema, often chronic and related to underlying renal dysfunction, recommend correlation with urinalysis to exclude urinary tract infection. 4. Complete fatty replacement of the pancreas, likely secondary to history of diabetes. 5. Aortic Atherosclerosis (ICD10-I70.0). Coronary artery calcifications. Electronically Signed   By: Keith Rake M.D.   On: 07/07/2018 23:32   ASSESSMENT AND PLAN:   Dimitrious Micciche  is a 57 y.o. male who presents with chief complaint as above.  Patient was brought to the ED by his son who states that he became more lethargic today after being out and about.  He had some slurred speech and was confused. Here in the ED he was febrile, tachycardic, with leukocytosis.  *Severe sepsis ([place]) suspected due to severe colitis -broad-spectrum-IV antibiotics initiated, lactic acid was mildly elevated.  Patient has a history of heart failure, and so we will administer IV fluids cautiously and trend lactic until within normal limits.  Blood pressure has been stable.  Cultures sent.  *Acute  Colitis -IV antibiotics cefepime and flagyl, other treatment as above  *  Acute renal failure superimposed on chronic kidney disease (HCC) -IV fluids as above, avoid nephrotoxins and monitor  *  Acute  non-QWMI in the setting of history of   Coronary artery disease -continue home meds -seen by cardiology. Follow the recommendation from cardiology -f/u echo -cont medical management -troponin 11.48. -IV heparin gtt -plavix,coreg, crestor -Holding entresto  *  DM (diabetes mellitus), type 2, uncontrolled with complications (HCC) -findings scale insulin with corresponding glucose checks -Lantus and ssi  *  Chronic combined systolic and diastolic CHF (congestive heart failure) (HCC) -cautious IV fluid administration as above, continue home meds  *  GERD (gastroesophageal reflux disease) -Home dose PPI    *Hyperlipidemia -Home dose antilipid    Case discussed with Care Management/Social Worker. Management plans discussed with the patient, family and they are in agreement.  CODE STATUS: full  DVT Prophylaxis: heparin gtt  TOTAL TIME TAKING CARE OF THIS PATIENT: 35 minutes.  >50% time spent on counselling and coordination of care  POSSIBLE D/C IN *fewDAYS, DEPENDING ON CLINICAL CONDITION.  Note: This dictation was prepared with Dragon dictation along with smaller phrase technology. Any transcriptional errors that result from this process are unintentional.  Fritzi Mandes M.D on 07/08/2018 at 2:52 PM  Between 7am to 6pm - Pager - [phone]  After 6pm go to [email or web address] - password [email or web address] [facility]  Office  [phone]  CC: Primary care physician; [name], MDPatient ID: [name], male   DOB: 1960-10-20, 57 y.o.   MRN: [id number]

## 2018-07-08 NOTE — Progress Notes (Signed)
Lab called critical troponin 11.48. NP made aware and RN obtained another EKG. Pt not c/o CP at this time, will continue to monitor.

## 2018-07-08 NOTE — ED Notes (Signed)
Spoke with Dr. Marcille Blanco and informed him of pt lactic acid of 3.3

## 2018-07-08 NOTE — Progress Notes (Addendum)
Pharmacy Antibiotic Note  [name] is a 57 y.o. male admitted on 07/07/2018 with sepsis secondary to acute sigmoid colitis. Patient has an extensive cardiac history with s/p 11 stents and found to have elevated troponin 11.48 on 10/4, however no chest pain. Patient also has AKI on CKD. Pharmacy has been consulted for cefepime dosing.  Plan: Per discussion with CCM on the morning rounds on 10/4, will discontinue vancomycin as the source is most likely intra-abdominal.     Continue Cefepime 2g IV q12h and metronidazole IV 500mg  q8h.   Height: 5\' 8"  (172.7 cm) Weight: 237 lb (107.5 kg) IBW/kg (Calculated) : 68.4  Temp (24hrs), Avg:100.1 F (37.8 C), Min:97.7 F (36.5 C), Max:103.1 F (39.5 C)  Recent Labs  Lab 07/07/18 2005 07/08/18 0002 07/08/18 0420 07/08/18 0606 07/08/18 0946  WBC 1.7*  --   --  5.7  --   CREATININE 1.74*  --   --  1.98*  --   LATICACIDVEN 2.5* 2.8* 3.3*  --  3.0*    Estimated Creatinine Clearance: 47.7 mL/min (A) (by C-G formula based on SCr of 1.98 mg/dL (H)).    Allergies  Allergen Reactions  . Metoprolol Rash  . Novolog [Insulin Aspart] Hives    Antimicrobials this admission: Vancomycin 10/3 x2    Cefepime 10/3 >>  Metronidazole 10/3 >>  Dose adjustments this admission: N/A  Microbiology results: 10/4: BCx: pending  10/3: BCx: no growth x 12 hours 10/3: MRSA PCR: negative   Thank you for allowing pharmacy to be a part of this patient's care.  Ocie Doyne, PharmD Candidate 07/08/2018 1:45 PM

## 2018-07-08 NOTE — Progress Notes (Signed)
Subjective:  Patient ID: [name], male    DOB: [birth year]  Age: 57 y.o. MRN: [id number]  CC: Back Pain (left, lower)   Procedure: None  HPI [name] presents for reevaluation.  He was last seen 2 months ago and his done reasonably well in regards to his low back pain.  The quality characteristic and distribution of this is been stable in nature with no significant changes noted.  He is still experiencing low back pain of the same nature with no change in lower extremity strength or function.  Bowel bladder function been stable.  Based on his narcotic assessment sheet he is continued to derive good functional lifestyle improvement with his medications.  No untoward side effects been noted.  Other wise he reports that he is in his usual state of health.  Recently did have coronary stents placed and as as result remains on anticoagulation therapy  No facility-administered medications prior to visit.    Outpatient Medications Prior to Visit  Medication Sig Dispense Refill  . albuterol (PROVENTIL HFA;VENTOLIN HFA) 108 (90 Base) MCG/ACT inhaler Inhale 2 puffs into the lungs every 6 (six) hours as needed for wheezing or shortness of breath.    [name] albuterol (PROVENTIL) (2.5 MG/3ML) 0.083% nebulizer solution Take 2.5 mg by nebulization every 6 (six) hours as needed for wheezing or shortness of breath.    [name] aspirin EC 81 MG tablet Take 1 tablet (81 mg total) by mouth daily. 90 tablet 3  . BREO ELLIPTA 200-25 MCG/INH AEPB INHALE 1 PUFF INTO THE LUNGS DAILY (Patient taking differently: Inhale 1 puff into the lungs daily. ) 1 each 3  . carvedilol (COREG) 6.25 MG tablet Take 1 tablet (6.25 mg total) by mouth 2 (two) times daily. 180 tablet 3  . clopidogrel (PLAVIX) 75 MG tablet Take 1 tablet (75 mg total) by mouth daily with breakfast. 30 tablet 6  . cyanocobalamin 1000 MCG tablet Take 1,000 mcg by mouth daily.     [name] dexlansoprazole (DEXILANT) 60 MG capsule Take 1 capsule (60 mg total) by  mouth daily. 30 capsule 6  . ezetimibe (ZETIA) 10 MG tablet Take 1 tablet (10 mg total) by mouth daily. 90 tablet 3  . furosemide (LASIX) 40 MG tablet Take 1/2 tablet (20 mg) by mouth once every other day (Patient not taking: Reported on 07/07/2018) 30 tablet 6  . gabapentin (NEURONTIN) 300 MG capsule Take 1 capsule (300 mg total) by mouth 3 (three) times daily. 90 capsule 1  . gabapentin (NEURONTIN) 800 MG tablet Take 1 tablet (800 mg total) by mouth at bedtime. 90 tablet 0  . insulin lispro (HUMALOG KWIKPEN) 100 UNIT/ML KiwkPen Inject 7-8 Units into the skin 3 (three) times daily. Reported on 12/02/2015/ sliding scale 1 unit for every 8 units of carbs; and 1 unit for every 20 above 120    . Insulin Pen Needle (FIFTY50 PEN NEEDLES) 32G X 4 MM MISC Inject 1 each into the skin as needed.    [name] LANTUS SOLOSTAR 100 UNIT/ML Solostar Pen Inject 25-30 Units into the skin daily at 10 pm.   0  . levocetirizine (XYZAL) 5 MG tablet Take 1 tablet (5 mg total) by mouth every evening. 30 tablet 3  . liraglutide (VICTOZA) 18 MG/3ML SOPN 1.8 units every morning (Patient taking differently: Inject 1.8 mg into the skin daily. )    . loratadine (CLARITIN) 10 MG tablet Take 10 mg by mouth daily.     . metFORMIN (GLUCOPHAGE)  1000 MG tablet Take 1,000 mg by mouth 2 (two) times daily with a meal.    . montelukast (SINGULAIR) 10 MG tablet Take 10 mg by mouth at bedtime.  1  . naloxone (NARCAN) nasal spray 4 mg/0.1 mL For excess sedation from opioids 1 kit 2  . nitroGLYCERIN (NITROSTAT) 0.3 MG SL tablet Place 1 tablet (0.3 mg total) under the tongue every 5 (five) minutes as needed for chest pain. Reported on 12/02/2015 25 tablet 3  . pyridostigmine (MESTINON) 60 MG tablet Take 60 mg by mouth 4 (four) times daily.    . rosuvastatin (CRESTOR) 5 MG tablet Take 5 mg by mouth daily.  0  . sacubitril-valsartan (ENTRESTO) 24-26 MG Take 1 tablet by mouth 2 (two) times daily. 60 tablet 6  . triamcinolone cream (KENALOG) 0.1 % Apply  1 application topically 2 (two) times daily as needed (rash).   0  . cyclobenzaprine (FLEXERIL) 10 MG tablet Take 1 tablet (10 mg total) by mouth 2 (two) times daily. 60 tablet 5  . Oxycodone HCl 10 MG TABS Take 1 tablet (10 mg total) by mouth 4 (four) times daily as needed. 120 tablet 0    Review of Systems CNS: No confusion or sedation Cardiac: No angina or palpitations GI: No abdominal pain or constipation Constitutional: No nausea vomiting fevers or chills  Objective:  BP 103/61   Pulse 100   Temp 97.9 F (36.6 C) (Oral)   Resp 18   Ht '5\' 7"'$  (1.702 m)   Wt 229 lb (103.9 kg)   SpO2 100%   BMI 35.87 kg/m    BP Readings from Last 3 Encounters:  07/08/18 (!) 83/55  07/07/18 103/61  06/21/18 110/63     Wt Readings from Last 3 Encounters:  07/07/18 237 lb (107.5 kg)  07/07/18 229 lb (103.9 kg)  06/21/18 233 lb (105.7 kg)     Physical Exam Pt is alert and oriented PERRL EOMI HEART IS RRR no murmur or rub LCTA no wheezing or rales MUSCULOSKELETAL reveals some paraspinous muscle tenderness but no overt trigger points.  He is ambulating with an antalgic gait.  His muscle tone and bulk to the lower extremities appears unchanged  Labs  Lab Results  Component Value Date   HGBA1C 7.9 (A) 06/01/2018   HGBA1C 7.9 (A) 05/18/2018   HGBA1C 7.2 01/25/2018   Lab Results  Component Value Date   MICROALBUR 204 05/18/2018   LDLCALC 50 05/18/2018   CREATININE 1.98 (H) 07/08/2018    -------------------------------------------------------------------------------------------------------------------- Lab Results  Component Value Date   WBC 5.7 07/08/2018   HGB 11.6 (L) 07/08/2018   HCT 34.1 (L) 07/08/2018   PLT 122 (L) 07/08/2018   GLUCOSE 248 (H) 07/08/2018   CHOL 109 05/18/2018   TRIG 112 05/18/2018   HDL 37 05/18/2018   LDLCALC 50 05/18/2018   ALT 30 07/07/2018   AST 40 07/07/2018   NA 142 07/08/2018   K 4.2 07/08/2018   CL 108 07/08/2018   CREATININE 1.98 (H)  07/08/2018   BUN 39 (H) 07/08/2018   CO2 20 (L) 07/08/2018   TSH 4.130 11/25/2017   INR 1.26 07/07/2018   HGBA1C 7.9 (A) 06/01/2018   MICROALBUR 204 05/18/2018    --------------------------------------------------------------------------------------------------------------------- Ct Head Wo Contrast  Result Date: 07/08/2018 CLINICAL DATA:  Slurred speech, lethargy and confusion today. EXAM: CT HEAD WITHOUT CONTRAST TECHNIQUE: Contiguous axial images were obtained from the base of the skull through the vertex without intravenous contrast. COMPARISON:  None. FINDINGS: Brain:  No evidence of acute infarction, hemorrhage, hydrocephalus, extra-axial collection or mass lesion/mass effect. Vascular: No hyperdense vessel or unexpected calcification. Skull: Normal. Negative for fracture or focal lesion. Sinuses/Orbits: Negative. Other: None. IMPRESSION: Negative head CT. Electronically Signed   By: Inge Rise M.D.   On: 07/08/2018 12:42     Assessment & Plan:   Lorraine was seen today for back pain.  Diagnoses and all orders for this visit:  Cervicalgia  Facet arthritis of lumbosacral region  Chronic pain syndrome  Chronic, continuous use of opioids  DDD (degenerative disc disease), lumbar  Myasthenia gravis (HCC)  Lyme arthritis of multiple joints (HCC)  Bilateral sciatica  Other orders -     Discontinue: Oxycodone HCl 10 MG TABS; Take 1 tablet (10 mg total) by mouth 4 (four) times daily as needed. -     Oxycodone HCl 10 MG TABS; Take 1 tablet (10 mg total) by mouth 4 (four) times daily as needed. (Patient taking differently: Take 10 mg by mouth 4 (four) times daily as needed (pain). ) -     cyclobenzaprine (FLEXERIL) 10 MG tablet; Take 1 tablet (10 mg total) by mouth 2 (two) times daily. (Patient taking differently: Take 10 mg by mouth 2 (two) times daily as needed for muscle spasms.  )        ----------------------------------------------------------------------------------------------------------------------  Problem List Items Addressed This Visit      Unprioritized   Myasthenia gravis ([place])   Relevant Medications   cyclobenzaprine (FLEXERIL) 10 MG tablet    Other Visit Diagnoses    Cervicalgia    -  Primary   Facet arthritis of lumbosacral region       Relevant Medications   Oxycodone HCl 10 MG TABS   cyclobenzaprine (FLEXERIL) 10 MG tablet   Chronic pain syndrome       Chronic, continuous use of opioids       DDD (degenerative disc disease), lumbar       Relevant Medications   Oxycodone HCl 10 MG TABS   cyclobenzaprine (FLEXERIL) 10 MG tablet   Lyme arthritis of multiple joints (HCC)       Relevant Medications   Oxycodone HCl 10 MG TABS   cyclobenzaprine (FLEXERIL) 10 MG tablet   Bilateral sciatica       Relevant Medications   cyclobenzaprine (FLEXERIL) 10 MG tablet        ----------------------------------------------------------------------------------------------------------------------  1. Cervicalgia Continue with stretching and strengthening exercises  2. Facet arthritis of lumbosacral region Continue with core strengthening  3. Chronic pain syndrome As above and continue current medication therapy.  4. Chronic, continuous use of opioids Feels will be given for his oxycodone for October 17 and November 16.  We have reviewed the [facility] practitioner database information and it is appropriate.  Scheduling for a return to clinic in 2 months  5. DDD (degenerative disc disease), lumbar   6. Myasthenia gravis ([place])   7. Lyme arthritis of multiple joints ([place])   8. Bilateral sciatica     ----------------------------------------------------------------------------------------------------------------------  I am having Edgar Hartmann. Kuiken "[name]" maintain his insulin lispro, pyridostigmine, loratadine, albuterol,  gabapentin, gabapentin, metFORMIN, cyanocobalamin, albuterol, montelukast, LANTUS SOLOSTAR, naloxone, nitroGLYCERIN, triamcinolone cream, rosuvastatin, Insulin Pen Needle, ezetimibe, aspirin EC, clopidogrel, sacubitril-valsartan, liraglutide, carvedilol, levocetirizine, BREO ELLIPTA, furosemide, dexlansoprazole, Oxycodone HCl, and cyclobenzaprine.   Meds ordered this encounter  Medications  . DISCONTD: Oxycodone HCl 10 MG TABS    Sig: Take 1 tablet (10 mg total) by mouth 4 (four) times daily as needed.  Dispense:  120 tablet    Refill:  0    Do not fill until [id number]  . Oxycodone HCl 10 MG TABS    Sig: Take 1 tablet (10 mg total) by mouth 4 (four) times daily as needed.    Dispense:  120 tablet    Refill:  0    Do not fill until [id number]  . cyclobenzaprine (FLEXERIL) 10 MG tablet    Sig: Take 1 tablet (10 mg total) by mouth 2 (two) times daily.    Dispense:  60 tablet    Refill:  5   Patient's Medications  New Prescriptions   No medications on file  Previous Medications   ALBUTEROL (PROVENTIL HFA;VENTOLIN HFA) 108 (90 BASE) MCG/ACT INHALER    Inhale 2 puffs into the lungs every 6 (six) hours as needed for wheezing or shortness of breath.   ALBUTEROL (PROVENTIL) (2.5 MG/3ML) 0.083% NEBULIZER SOLUTION    Take 2.5 mg by nebulization every 6 (six) hours as needed for wheezing or shortness of breath.   ASPIRIN EC 81 MG TABLET    Take 1 tablet (81 mg total) by mouth daily.   BREO ELLIPTA 200-25 MCG/INH AEPB    INHALE 1 PUFF INTO THE LUNGS DAILY   CARVEDILOL (COREG) 6.25 MG TABLET    Take 1 tablet (6.25 mg total) by mouth 2 (two) times daily.   CLOPIDOGREL (PLAVIX) 75 MG TABLET    Take 1 tablet (75 mg total) by mouth daily with breakfast.   CYANOCOBALAMIN 1000 MCG TABLET    Take 1,000 mcg by mouth daily.    DEXLANSOPRAZOLE (DEXILANT) 60 MG CAPSULE    Take 1 capsule (60 mg total) by mouth daily.   EZETIMIBE (ZETIA) 10 MG TABLET    Take 1 tablet (10 mg total) by mouth daily.   FUROSEMIDE  (LASIX) 40 MG TABLET    Take 1/2 tablet (20 mg) by mouth once every other day   GABAPENTIN (NEURONTIN) 300 MG CAPSULE    Take 1 capsule (300 mg total) by mouth 3 (three) times daily.   GABAPENTIN (NEURONTIN) 800 MG TABLET    Take 1 tablet (800 mg total) by mouth at bedtime.   INSULIN LISPRO (HUMALOG KWIKPEN) 100 UNIT/ML KIWKPEN    Inject 7-8 Units into the skin 3 (three) times daily. Reported on 12/02/2015/ sliding scale 1 unit for every 8 units of carbs; and 1 unit for every 20 above 120   INSULIN PEN NEEDLE (FIFTY50 PEN NEEDLES) 32G X 4 MM MISC    Inject 1 each into the skin as needed.   LANTUS SOLOSTAR 100 UNIT/ML SOLOSTAR PEN    Inject 25-30 Units into the skin daily at 10 pm.    LEVOCETIRIZINE (XYZAL) 5 MG TABLET    Take 1 tablet (5 mg total) by mouth every evening.   LIRAGLUTIDE (VICTOZA) 18 MG/3ML SOPN    1.8 units every morning   LORATADINE (CLARITIN) 10 MG TABLET    Take 10 mg by mouth daily.    METFORMIN (GLUCOPHAGE) 1000 MG TABLET    Take 1,000 mg by mouth 2 (two) times daily with a meal.   MONTELUKAST (SINGULAIR) 10 MG TABLET    Take 10 mg by mouth at bedtime.   NALOXONE (NARCAN) NASAL SPRAY 4 MG/0.1 ML    For excess sedation from opioids   NITROGLYCERIN (NITROSTAT) 0.3 MG SL TABLET    Place 1 tablet (0.3 mg total) under the tongue every 5 (five) minutes as needed for chest pain. Reported  on 12/02/2015   PYRIDOSTIGMINE (MESTINON) 60 MG TABLET    Take 60 mg by mouth 4 (four) times daily.   ROSUVASTATIN (CRESTOR) 5 MG TABLET    Take 5 mg by mouth daily.   SACUBITRIL-VALSARTAN (ENTRESTO) 24-26 MG    Take 1 tablet by mouth 2 (two) times daily.   TRIAMCINOLONE CREAM (KENALOG) 0.1 %    Apply 1 application topically 2 (two) times daily as needed (rash).   Modified Medications   Modified Medication Previous Medication   CYCLOBENZAPRINE (FLEXERIL) 10 MG TABLET cyclobenzaprine (FLEXERIL) 10 MG tablet      Take 1 tablet (10 mg total) by mouth 2 (two) times daily.    Take 1 tablet (10 mg total) by  mouth 2 (two) times daily.   OXYCODONE HCL 10 MG TABS Oxycodone HCl 10 MG TABS      Take 1 tablet (10 mg total) by mouth 4 (four) times daily as needed.    Take 1 tablet (10 mg total) by mouth 4 (four) times daily as needed.  Discontinued Medications   No medications on file   ----------------------------------------------------------------------------------------------------------------------  Follow-up: Return in about 2 months (around 09/06/2018) for evaluation, med refill.    Molli Barrows, MD

## 2018-07-08 NOTE — Progress Notes (Signed)
eLink Physician-Brief Progress Note Patient Name: [name] DOB: November 16, 1960 MRN: [id number]   Date of Service  07/08/2018  HPI/Events of Note  57 y.o. male who presents with chief complaint as above.  Patient was brought to the ED by his son who states that he became more lethargic today after being out and about.  He had some slurred speech and was confused.  This started in the afternoon.  Here in the ED he was febrile, tachycardic, with leukocytosis.  Work-up revealed only possible colitis on imaging.  PCCM asked to assume care in ICU. VSS.   eICU Interventions  No new orders.      Intervention Category Evaluation Type: New Patient Evaluation  Lysle Dingwall 07/08/2018, 5:52 AM

## 2018-07-08 NOTE — Progress Notes (Signed)
[place] for heparin Indication: chest pain/ACS  Allergies  Allergen Reactions  . Metoprolol Rash  . Novolog [Insulin Aspart] Hives    Patient Measurements: Height: 5\' 8"  (172.7 cm) Weight: 237 lb (107.5 kg) IBW/kg (Calculated) : 68.4 Heparin Dosing Weight: 92.1 kg  Vital Signs: Temp: 97.1 F (36.2 C) (10/04 1500) Temp Source: Axillary (10/04 1500) BP: 92/57 (10/04 1600) Pulse Rate: 107 (10/04 1600)  Labs: Recent Labs    07/07/18 2005 07/08/18 0606 07/08/18 0812 07/08/18 1632  HGB 11.9* 11.6*  --   --   HCT 34.4* 34.1*  --   --   PLT 105* 122*  --   --   LABPROT 15.7*  --   --   --   INR 1.26  --   --   --   HEPARINUNFRC  --   --   --  0.27*  CREATININE 1.74* 1.98*  --   --   TROPONINI  --   --  11.48*  --     Estimated Creatinine Clearance: 47.7 mL/min (A) (by C-G formula based on SCr of 1.98 mg/dL (H)).   Medical History: Past Medical History:  Diagnosis Date  . Allergy    dust, seasonal (worse in the fall).  . 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 Followed by Pain Specialist in [place], back and neck  . Asthma    BRONCHITIS  . Cataract    First Dx in 2012  . Chronic combined systolic and diastolic congestive heart failure ([place])    a. 03/2018 Echo: EF 30-35%, ant, antlat, apical AK, Gr1 DD.  [name] Coronary artery disease    a. Prior Ant MI->s/p multiple stents placed in the LAD and right coronary artery ([place]); b. 2016 Cath: reportedly nonobs dzs;  c. 02/2017 MV: EF 45-54%, ap/ant, ap/inf, apical infarct, no ishcemia; c. 04/2018 Cath/PCI: LM nl, LAD 20p, patent mid stent, LCX 66m(3.25x15 [place] DES), OM1 nl, OM2 50, OM3 40 w/ patent stent, RCA 40p, 89m, 40d w/ patent stent in RPDA, RPAV 60, EF 25-35%. 2+MR.  . Diabetes mellitus without complication ([place])    TYPE 2  . Diabetic peripheral neuropathy (HCC)    feet and hands  . GERD (gastroesophageal reflux disease)   . Headache    muscle tension  . Hyperlipidemia   .  Hypertension    CONTROLLED ON MEDS  . Insomnia   . Ischemic cardiomyopathy    a. 03/2018 Echo: EF 30-35%, ant, antlat, apical AK, Gr1 DD, mild MR, mildly dil LA.  [name] Lyme disease    Chronic  . Myasthenia gravis ([place])   . Myocardial infarction ([place]) 2010  . Seasonal allergies   . Sleep apnea    CPAP    Assessment: 57 year old male with extensive cardiac PMH. H/o 11 stents. No anticoagulation PTA. Troponin 10/4 am elevated at 11.48. Pharmacy consulted for heparin drip.  Goal of Therapy:  Heparin level 0.3-0.7 units/ml Monitor platelets by anticoagulation protocol: Yes   Plan:  Per conversation with NP Blak[place]y, will not give boluses. Platelets low at 122.  10/4 1632 HL 0.27. Subtherapeutic. Will increase heparin drip to 1250 units/hr and recheck HL at 2300. CBC ordered with morning labs. Will follow platelets closely for any change.  Tawnya Crook, PharmD Pharmacy Resident  07/08/2018 4:59 PM

## 2018-07-08 NOTE — Consult Note (Signed)
Cardiology Consult    Patient ID: [name] MRN: [id number], DOB/AGE: January 30, 1961   Admit date: 07/07/2018 Date of Consult: 07/08/2018  Primary Physician: [name], MD Primary Cardiologist: [name], MD Requesting Provider: Chauncey Cruel. Posey Pronto, MD  Patient Profile    [name] is a 56 y.o. male with a history of CAD status post multiple PCI's, hypertension, hyperlipidemia, type 2 diabetes mellitus, diabetic neuropathy, myasthenia gravis, and sleep apnea, who is being seen today for the evaluation of non-STEMI in the setting of colitis and sepsis at the request of Dr. Serita Grit.  Past Medical History   Past Medical History:  Diagnosis Date  . Allergy    dust, seasonal (worse in the fall).  . 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 Followed by Pain Specialist in [place], back and neck  . Asthma    BRONCHITIS  . Cataract    First Dx in 2012  . Chronic combined systolic and diastolic congestive heart failure ([place])    a. 03/2018 Echo: EF 30-35%, ant, antlat, apical AK, Gr1 DD.  [name] Coronary artery disease    a. Prior Ant MI->s/p multiple stents placed in the LAD and right coronary artery ([place]); b. 2016 Cath: reportedly nonobs dzs;  c. 02/2017 MV: EF 45-54%, ap/ant, ap/inf, apical infarct, no ishcemia; c. 04/2018 Cath/PCI: LM nl, LAD 20p, patent mid stent, LCX 65m(3.25x15 [place] DES), OM1 nl, OM2 50, OM3 40 w/ patent stent, RCA 40p, 16m, 40d w/ patent stent in RPDA, RPAV 60, EF 25-35%. 2+MR.  . Diabetes mellitus without complication ([place])    TYPE 2  . Diabetic peripheral neuropathy ([facility])    feet and hands  . GERD (gastroesophageal reflux disease)   . Headache    muscle tension  . Hyperlipidemia   . Hypertension    CONTROLLED ON MEDS  . Insomnia   . Ischemic cardiomyopathy    a. 03/2018 Echo: EF 30-35%, ant, antlat, apical AK, Gr1 DD, mild MR, mildly dil LA.  [name] Lyme disease    Chronic  . Myasthenia gravis ([place])   . Myocardial infarction ([place]) 2010  . Seasonal allergies   .  Sleep apnea    CPAP    Past Surgical History:  Procedure Laterality Date  . BILATERAL CARPAL TUNNEL RELEASE Bilateral L in 2012 and R in 2013  . CARDIAC CATHETERIZATION     Several Caths, most recent in  March 2016.  [name] COLONOSCOPY WITH PROPOFOL N/A 01/10/2016   Procedure: COLONOSCOPY WITH PROPOFOL;  Surgeon: [name], MD;  Location: [facility] END[facility];  Service: Endoscopy;  Laterality: N/A;  . CORONARY ANGIOPLASTY    . CORONARY STENT INTERVENTION N/A 04/25/2018   Procedure: CORONARY STENT INTERVENTION;  Surgeon: [name], MD;  Location: [place] [facility];  Service: Cardiovascular;  Laterality: N/A;  . ESOPHAGOGASTRODUODENOSCOPY (EGD) WITH PROPOFOL N/A 01/10/2016   Procedure: ESOPHAGOGASTRODUODENOSCOPY (EGD) WITH PROPOFOL;  Surgeon: [name], MD;  Location: [facility] END[facility]COPY;  Service: Endoscopy;  Laterality: N/A;  . EYE SURGERY Bilateral 2012   cataract/bilateral vitrectomies  . LEFT HEART CATH AND CORONARY ANGIOGRAPHY Left 04/25/2018   Procedure: LEFT HEART CATH AND CORONARY ANGIOGRAPHY;  Surgeon: [name], MD;  Location: [place] [facility];  Service: Cardiovascular;  Laterality: Left;  . TONSILLECTOMY AND ADENOIDECTOMY     As a child  . TUNNELED VENOUS CATHETER PLACEMENT     removed     Allergies  Allergies  Allergen Reactions  . Metoprolol Rash  . Novolog [Insulin Aspart] Hives  History of Present Illness    57 year old male with the above complex past medical history including CAD status post prior myocardial infarction in 2009 with subsequent placement of a total of 10 stents between the LAD and right coronary artery.  These were all performed in [place].  Other history includes hypertension, hyperlipidemia, type 2 diabetes mellitus, diabetic neuropathy, myasthenia gravis, GERD, and sleep apnea on CPAP.  In June of this year, he reported some progression of his baseline level of dyspnea on exertion and a few episodes of nitrate responsive angina at rest.  In  that setting, we proceeded with an echocardiogram which showed new LV dysfunction with an EF of 30 to 35%.  Dr. Fletcher Anon proceeded with diagnostic catheterization in July, which showed a new 90% stenosis in the mid left circumflex with multiple patent LAD, RCA, and OM2 stents.  The left circumflex was successfully treated with a drug-eluting stent.  His LVEDP was severely elevated at the time of his catheterization and he was placed on Lasix and Entresto.  Creatinine bumped shortly thereafter and Lasix dose was reduced to 20 mg daily with stabilization of renal function.  He says that from a cardiac standpoint, he has done well.  He has chronic, stable fatigue with mild, stable dyspnea on exertion.  He has not been having any chest pain.  He was in his usual state of health until October 3, when he went to see his pain doctor.  He said he was having significant lower back pain at that time and was just uncomfortable while sitting upright.  While sitting in the doctor's office, he said he became somewhat flushed and warm.  After completing the physician visit, he left and his son noticed that he was becoming disoriented.  Patient also developed worsening fever and vomiting.  His son subsequently brought him to the emergency department.  Here, he was febrile with a temperature of 103.1.  Blood pressures were soft in the 90s to low 100s while he was tachycardic with rates into the 140s-sinus tachycardia.  Lactic acid was elevated at 2.5 and subsequently rose to 3.3.  Creatinine was elevated above baseline at 1.74.  Chest x-ray nonacute.  CT of the abdomen and pelvis showed a short segment sigmoid colonic wall thickening with questionable pericolonic edema, suspicious for colitis.  This morning, his procalcitonin is 105.82.  Troponin was checked and as elevated at 11.48.  He denies any chest pain or dyspnea.  Inpatient Medications    . acetaminophen      . aspirin EC  81 mg Oral Daily  . carvedilol  6.25 mg Oral  BID  . clopidogrel  75 mg Oral Q breakfast  . fluticasone furoate-vilanterol  1 puff Inhalation Daily  . insulin lispro  0-15 Units Subcutaneous Q4H  . montelukast  10 mg Oral QHS  . pantoprazole  40 mg Oral Daily  . pyridostigmine  60 mg Oral QID  . rosuvastatin  5 mg Oral Daily  . sacubitril-valsartan  1 tablet Oral BID    Family History    Family History  Problem Relation Age of Onset  . Diabetes Mother   . Heart disease Mother   . Cancer Father        Prostate CA  . Diabetes Brother   . Healthy Brother   . Healthy Brother    He indicated that his mother is deceased. He indicated that his father is alive. He indicated that his sister is alive. He indicated that three of  his four brothers are alive.   Social History    Social History   Socioeconomic History  . Marital status: Married    Spouse name: Neoma Laming  . Number of children: 0  . Years of education: Not on file  . Highest education level: Master's degree (e.g., MA, MS, MEng, MEd, MSW, MBA)  Occupational History  . Occupation: disabled    Comment: multiple medical problems - first approved for uncontrolled DM, but now has heart disease and Myasthenia Gravis   Social Needs  . Financial resource strain: Not hard at all  . Food insecurity:    Worry: Never true    Inability: Never true  . Transportation needs:    Medical: No    Non-medical: No  Tobacco Use  . Smoking status: Never Smoker  . Smokeless tobacco: Never Used  . Tobacco comment: smoking cessation materials not required  Substance and Sexual Activity  . Alcohol use: Not Currently    Alcohol/week: 0.0 standard drinks  . Drug use: No  . Sexual activity: Not Currently  Lifestyle  . Physical activity:    Days per week: 7 days    Minutes per session: 30 min  . Stress: Not at all  Relationships  . Social connections:    Talks on phone: More than three times a week    Gets together: Three times a week    Attends religious service: More than 4  times per year    Active member of club or organization: Yes    Attends meetings of clubs or organizations: More than 4 times per year    Relationship status: Married  . Intimate partner violence:    Fear of current or ex partner: No    Emotionally abused: No    Physically abused: No    Forced sexual activity: No  Other Topics Concern  . Not on file  Social History Narrative  . Not on file     Review of Systems    General: He has chronic fatigue which is unchanged.  He has been febrile began yesterday.  No chills, night sweats or weight changes.  Cardiovascular:  No chest pain, he has chronic, stable dyspnea on exertion which is unchanged.  No edema, orthopnea, palpitations, paroxysmal nocturnal dyspnea. Dermatological: No rash, lesions/masses Respiratory: No cough, chronic stable dyspnea on exertion. Urologic: No hematuria, dysuria Abdominal:   +++ nausea/vomiting prior to arrival.  No diarrhea, bright red blood per rectum, melena, or hematemesis Neurologic:  No visual changes, wkns, changes in mental status. All other systems reviewed and are otherwise negative except as noted above.  Physical Exam    Blood pressure 93/63, pulse (!) 126, temperature 97.7 F (36.5 C), temperature source Oral, resp. rate (!) 27, height 5\' 8"  (1.727 m), weight 107.5 kg, SpO2 95 %.  General: Pleasant, NAD Psych: Normal affect. Neuro: Alert and oriented X 3. Moves all extremities spontaneously. HEENT: Normal  Neck: Supple without bruits or JVD. Lungs:  Resp regular and unlabored, diminished breath sounds bilateral bases. Heart: RRR, tachycardic, no s3, s4, or murmurs. Abdomen: Soft, non-tender, non-distended, BS + x 4.  Extremities: No clubbing, cyanosis or edema. DP/PT/Radials 2+ and equal bilaterally.  Labs     Recent Labs    07/08/18 0812  TROPONINI 11.48*   Lab Results  Component Value Date   WBC 5.7 07/08/2018   HGB 11.6 (L) 07/08/2018   HCT 34.1 (L) 07/08/2018   MCV 89.5  07/08/2018   PLT 122 (L) 07/08/2018  Recent Labs  Lab 07/07/18 2005 07/08/18 0606  NA 138 142  K 4.0 4.2  CL 104 108  CO2 25 20*  BUN 36* 39*  CREATININE 1.74* 1.98*  CALCIUM 8.7* 8.6*  PROT 6.1*  --   BILITOT 1.4*  --   ALKPHOS 86  --   ALT 30  --   AST 40  --   GLUCOSE 275* 248*   Lab Results  Component Value Date   CHOL 109 05/18/2018   HDL 37 05/18/2018   LDLCALC 50 05/18/2018   TRIG 112 05/18/2018    Radiology Studies    Dg Chest 1 View  Result Date: 07/07/2018 CLINICAL DATA:  Shortness of breath. Altered mental status. EXAM: CHEST  1 VIEW COMPARISON:  01/27/2018. FINDINGS: Poor inspiration. Normal sized heart. Coronary artery stent. Clear lungs with normal vascularity. Thoracic spine degenerative changes. IMPRESSION: No acute abnormality. Electronically Signed   By: Claudie Revering M.D.   On: 07/07/2018 20:48   Ct Abdomen Pelvis W Contrast  Result Date: 07/07/2018 CLINICAL DATA:  Vomiting, fever, and altered mental status. EXAM: CT ABDOMEN AND PELVIS WITH CONTRAST TECHNIQUE: Multidetector CT imaging of the abdomen and pelvis was performed using the standard protocol following bolus administration of intravenous contrast. CONTRAST:  37mL OMNIPAQUE IOHEXOL 300 MG/ML  SOLN COMPARISON:  None. FINDINGS: Lower chest: Breathing motion artifact. Subsegmental atelectasis in the right lower lobe. No confluent consolidation. No pleural fluid. There are coronary artery calcifications. Hepatobiliary: No focal hepatic abnormality. Mild gallbladder distention, motion artifact through the gallbladder limits assessment for wall thickening. No calcified gallstone. No biliary dilatation. Pancreas: Completely fatty replaced. No ductal dilatation or inflammation. Spleen: Normal in size without focal abnormality. Adrenals/Urinary Tract: Normal adrenal glands. No hydronephrosis. Bilateral symmetric perinephric edema. Delayed excretion on delayed phase imaging. Urinary bladder is nondistended.  Equivocal bladder wall thickening versus nondistention. Stomach/Bowel: Stomach physiologically distended. No small bowel wall thickening, inflammatory change or wall thickening, slight motion artifact limitations. Cecum is high-riding in the right mid abdomen. Normal appendix. Hepatic flexure through descending colon are nondistended, limiting assessment. Short segment proximal sigmoid colonic wall thickening with questionable pericolonic edema, image 71 series 2, partially obscured by motion. Moderate stool in the more distal sigmoid colon, stool distends the rectum which spans 7 cm. Vascular/Lymphatic: Moderate aortic and branch atherosclerosis. No enlarged lymph nodes in the abdomen or pelvis. Reproductive: Central prostatic calcifications. Other: Small amount of free fluid in the pelvis, likely reactive. No free air or intra-abdominal abscess. Scattered soft tissue densities in the anterior abdominal wall most consistent with medication injection sites. Musculoskeletal: There are no acute or suspicious osseous abnormalities. IMPRESSION: 1. Short segment sigmoid colonic wall thickening with questionable pericolonic edema, suspicious for colitis. Motion artifact limits detailed assessment. Consider follow-up colonoscopy to exclude underlying neoplasm after resolution of acute event. Hepatic flexure through the distal descending colon are nondistended, limiting assessment for additional subtle colonic wall thickening. 2. Moderate colonic stool in the sigmoid colon, stool distends the rectum. 3. Bilateral perinephric edema, often chronic and related to underlying renal dysfunction, recommend correlation with urinalysis to exclude urinary tract infection. 4. Complete fatty replacement of the pancreas, likely secondary to history of diabetes. 5. Aortic Atherosclerosis (ICD10-I70.0). Coronary artery calcifications. Electronically Signed   By: Keith Rake M.D.   On: 07/07/2018 23:32    ECG & Cardiac Imaging      Sinus tachycardia, 143, right axis, prior inferior and anterolateral infarcts.  No acute changes.-Personally reviewed  Assessment & Plan  1.  Sepsis in the setting of acute sigmoid colitis: Patient was experiencing significant back pain yesterday and was seen in pain clinic.  He later developed fever, nausea, vomiting, and disorientation.  In the emergency department, he was found to have an elevated lactate.  CT suggests sigmoid colitis.  In that setting, he has been tachycardic and relatively hypotensive.  He has been NPO and received vancomycin yesterday and is now on cefepime.  Mgmt per CCM.  2.  Non-STEMI/CAD: Patient with an extensive history of coronary artery disease status post 11 total stents.  Last catheterization in July was performed after echocardiogram in June showed new LV dysfunction.  He was found to have severe circumflex stenosis with patent LAD, RCA, and OM 2 stents.  Circumflex was successfully intervened upon.  He has not been having any chest pain.  He has chronic, stable dyspnea on exertion and fatigue and says overall, he has been doing well from a cardiac standpoint.  In the setting of sepsis with tachycardia and relative hypotension, his troponin is elevated this morning at 11.48.  He is chest pain-free and denies experiencing any chest pain yesterday.  He is currently on aspirin, beta-blocker, Entresto, Plavix, statin, and heparin therapy, though beta-blocker and Entresto held in the setting of soft blood pressures.  Hold Entresto in the setting of worsening renal failure.  Continue to trend troponin.  Follow-up echocardiogram.  Pending troponin trend, he will likely require repeat angiography at some point prior to discharge, once he has recovered from sepsis and acute kidney injury.  3.  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  Creatinine 1.98, which is up from 1.74 yesterday.  Creatinine was 1.36 on September 16 and he  was in the 1.1 range back in July.  As above, we will hold Entresto.  4.  Chronic combined systolic and diastolic CHF/ICM:  Euvolemic on exam.  Has been doing well @ home.  Cont  blocker as tolerated.  Holding entresto as above.  Watch wt/volume status/I&O closely.    5.  Relative hypotension: Holding Entresto.  Strict hold parameters for carvedilol.  He has not received either today.  6.  Sinus tachycardia: Patient has a chronic sinus tachycardia with rates typically in the low 100s.  Rates higher in the setting of acute illness currently.  Continue beta-blocker as tolerated.  7.  Hyperlipidemia: On low-dose rosuvastatin and Zetia at home.  Both are currently on hold.  LDL was 50 in August.  LFTs within normal limits this admission.  8.  Type 2 diabetes mellitus: A1c 7.9 in August.  Glucose is moderately elevated in the setting of acute illness.  Insulin management per critical care.  9.  Thrombocytopenia: Platelets 105,000 on admission.  Slightly higher this morning at 122,000.  Previously in the 180s.  Follow.  Continue aspirin and Plavix in the setting of recent stenting.  10.  Acute hypoxic respiratory failure: O2 sat was 85% on room air on admission.  Chest x-ray without acute findings.  Saturating well this morning on nasal cannula.  Signed, Murray Hodgkins, NP 07/08/2018, 11:17 AM  For questions or updates, please contact   Please consult [email or web address] for contact info under Cardiology/STEMI.

## 2018-07-08 NOTE — H&P (Signed)
[facility] NAME: [name]    MR#:  [id number]  DATE OF BIRTH:  November 29, 1960  DATE OF ADMISSION:  07/07/2018  PRIMARY CARE PHYSICIAN: [name], MD   REQUESTING/REFERRING PHYSICIAN: Archie Balboa, MD  CHIEF COMPLAINT:   Chief Complaint  Patient presents with  . Fever  . Altered Mental Status    HISTORY OF PRESENT ILLNESS:  [name]  is a 57 y.o. male who presents with chief complaint as above.  Patient was brought to the ED by his son who states that he became more lethargic today after being out and about.  He had some slurred speech and was confused.  This started in the afternoon.  Here in the ED he was febrile, tachycardic, with leukocytosis.  Work-up revealed only possible colitis on imaging.  Hospitalist were called for admission  PAST MEDICAL HISTORY:   Past Medical History:  Diagnosis Date  . Allergy    dust, seasonal (worse in the fall).  . 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 Followed by Pain Specialist in [place], back and neck  . Asthma    BRONCHITIS  . Cataract    First Dx in 2012  . CHF (congestive heart failure) ([place])   . Coronary artery disease    a. Prior Ant MI->s/p multiple stents placed in the LAD and right coronary artery ([place]); b. 2016 Cath: reportedly nonobs dzs;  c. 02/2017 MV: EF 45-54%, ap/ant, ap/inf, apical infarct, no ishcemia; c. 04/2018 Cath/PCI: LM nl, LAD 20p, patent mid stent, LCX 15m(3.25x15 Sierra DES), OM1 nl, OM2 50, OM3 40 w/ patent stent, RCA 40p, 35m, 40d w/ patent stent in RPDA, RPAV 60, EF 25-35%. 2+MR.  . Diabetes mellitus without complication ([place])    TYPE 2  . Diabetic peripheral neuropathy (HCC)    feet and hands  . GERD (gastroesophageal reflux disease)   . Headache    muscle tension  . Hyperlipidemia   . Hypertension    CONTROLLED ON MEDS  . Insomnia   . Ischemic cardiomyopathy    a. 03/2018 Echo: EF 30-35%, ant, antlat, apical AK, Gr1 DD, mild MR, mildly dil  LA.  [name] Lyme disease    Chronic  . Myasthenia gravis ([place])   . Myocardial infarction ([place]) 2010  . Seasonal allergies   . Sleep apnea    CPAP     PAST SURGICAL HISTORY:   Past Surgical History:  Procedure Laterality Date  . BILATERAL CARPAL TUNNEL RELEASE Bilateral L in 2012 and R in 2013  . CARDIAC CATHETERIZATION     Several Caths, most recent in  March 2016.  [name] COLONOSCOPY WITH PROPOFOL N/A 01/10/2016   Procedure: COLONOSCOPY WITH PROPOFOL;  Surgeon: [name], MD;  Location: [facility] ENDOSCOPY;  Service: Endoscopy;  Laterality: N/A;  . CORONARY ANGIOPLASTY    . CORONARY STENT INTERVENTION N/A 04/25/2018   Procedure: CORONARY STENT INTERVENTION;  Surgeon: [name], MD;  Location: [facility];  Service: Cardiovascular;  Laterality: N/A;  . ESOPHAGOGASTRODUODENOSCOPY (EGD) WITH PROPOFOL N/A 01/10/2016   Procedure: ESOPHAGOGASTRODUODENOSCOPY (EGD) WITH PROPOFOL;  Surgeon: [name], MD;  Location: [facility] ENDOSCOPY;  Service: Endoscopy;  Laterality: N/A;  . EYE SURGERY Bilateral 2012   cataract/bilateral vitrectomies  . LEFT HEART CATH AND CORONARY ANGIOGRAPHY Left 04/25/2018   Procedure: LEFT HEART CATH AND CORONARY ANGIOGRAPHY;  Surgeon: [name], MD;  Location: [facility];  Service: Cardiovascular;  Laterality: Left;  . TONSILLECTOMY  AND ADENOIDECTOMY     As a child  . TUNNELED VENOUS CATHETER PLACEMENT     removed     SOCIAL HISTORY:   Social History   Tobacco Use  . Smoking status: Never Smoker  . Smokeless tobacco: Never Used  . Tobacco comment: smoking cessation materials not required  Substance Use Topics  . Alcohol use: Not Currently    Alcohol/week: 0.0 standard drinks     FAMILY HISTORY:   Family History  Problem Relation Age of Onset  . Diabetes Mother   . Heart disease Mother   . Cancer Father        Prostate CA  . Diabetes Brother   . Healthy Brother   . Healthy Brother      DRUG ALLERGIES:   Allergies   Allergen Reactions  . Metoprolol Rash  . Novolog [Insulin Aspart] Hives    MEDICATIONS AT HOME:   Prior to Admission medications   Medication Sig Start Date End Date Taking? Authorizing Provider  albuterol (PROVENTIL HFA;VENTOLIN HFA) 108 (90 Base) MCG/ACT inhaler Inhale 2 puffs into the lungs every 6 (six) hours as needed for wheezing or shortness of breath.   Yes [provider]  albuterol (PROVENTIL) (2.5 MG/3ML) 0.083% nebulizer solution Take 2.5 mg by nebulization every 6 (six) hours as needed for wheezing or shortness of breath.   Yes [provider]  aspirin EC 81 MG tablet Take 1 tablet (81 mg total) by mouth daily. 03/28/18  Yes Theora Gianotti, NP  BREO ELLIPTA 200-25 MCG/INH AEPB INHALE 1 PUFF INTO THE LUNGS DAILY Patient taking differently: Inhale 1 puff into the lungs daily.  06/08/18  Yes Poulose, Bethel Born, NP  carvedilol (COREG) 6.25 MG tablet Take 1 tablet (6.25 mg total) by mouth 2 (two) times daily. 05/12/18  Yes [name], MD  clopidogrel (PLAVIX) 75 MG tablet Take 1 tablet (75 mg total) by mouth daily with breakfast. 04/27/18  Yes Theora Gianotti, NP  cyanocobalamin 1000 MCG tablet Take 1,000 mcg by mouth daily.    Yes [provider]  cyclobenzaprine (FLEXERIL) 10 MG tablet Take 1 tablet (10 mg total) by mouth 2 (two) times daily. Patient taking differently: Take 10 mg by mouth 2 (two) times daily as needed for muscle spasms.  07/07/18  Yes Molli Barrows, MD  dexlansoprazole (DEXILANT) 60 MG capsule Take 1 capsule (60 mg total) by mouth daily. 06/29/18  Yes [name], MD  ezetimibe (ZETIA) 10 MG tablet Take 1 tablet (10 mg total) by mouth daily. 04/11/18 07/10/18 Yes Theora Gianotti, NP  gabapentin (NEURONTIN) 300 MG capsule Take 1 capsule (300 mg total) by mouth 3 (three) times daily. 09/25/15  Yes Roselee Nova, MD  gabapentin (NEURONTIN) 800 MG tablet Take 1 tablet (800 mg total) by mouth at bedtime.  09/25/15  Yes Rochel Brome A, MD  insulin lispro (HUMALOG KWIKPEN) 100 UNIT/ML KiwkPen Inject 7-8 Units into the skin 3 (three) times daily. Reported on 12/02/2015/ sliding scale 1 unit for every 8 units of carbs; and 1 unit for every 20 above 120   Yes [provider]  LANTUS SOLOSTAR 100 UNIT/ML Solostar Pen Inject 25-30 Units into the skin daily at 10 pm.  03/25/16  Yes Sherri Sear, MD  levocetirizine (XYZAL) 5 MG tablet Take 1 tablet (5 mg total) by mouth every evening. 06/07/18  Yes Poulose, Bethel Born, NP  liraglutide (VICTOZA) 18 MG/3ML SOPN 1.8 units every morning Patient taking  differently: Inject 1.8 mg into the skin daily.  04/26/18  Yes Theora Gianotti, NP  loratadine (CLARITIN) 10 MG tablet Take 10 mg by mouth daily.    Yes [provider]  metFORMIN (GLUCOPHAGE) 1000 MG tablet Take 1,000 mg by mouth 2 (two) times daily with a meal.   Yes Sherri Sear, MD  montelukast (SINGULAIR) 10 MG tablet Take 10 mg by mouth at bedtime. 04/01/16  Yes Erby Pian, MD  Oxycodone HCl 10 MG TABS Take 1 tablet (10 mg total) by mouth 4 (four) times daily as needed. Patient taking differently: Take 10 mg by mouth 4 (four) times daily as needed (pain).  07/07/18  Yes Molli Barrows, MD  pyridostigmine (MESTINON) 60 MG tablet Take 60 mg by mouth 4 (four) times daily.   Yes Anabel Bene, MD  rosuvastatin (CRESTOR) 5 MG tablet Take 5 mg by mouth daily. 11/20/17  Yes [name], MD  sacubitril-valsartan (ENTRESTO) 24-26 MG Take 1 tablet by mouth 2 (two) times daily. 04/26/18  Yes Theora Gianotti, NP  triamcinolone cream (KENALOG) 0.1 % Apply 1 application topically 2 (two) times daily as needed (rash).  04/09/17  Yes [provider]  furosemide (LASIX) 40 MG tablet Take 1/2 tablet (20 mg) by mouth once every other day Patient not taking: Reported on 07/07/2018 06/24/18   [name], MD  Insulin Pen Needle (FIFTY50 PEN NEEDLES) 32G  X 4 MM MISC Inject 1 each into the skin as needed. 02/24/18   [provider]  naloxone [facility]) nasal spray 4 mg/0.1 mL For excess sedation from opioids 09/15/16   Molli Barrows, MD  nitroGLYCERIN (NITROSTAT) 0.3 MG SL tablet Place 1 tablet (0.3 mg total) under the tongue every 5 (five) minutes as needed for chest pain. Reported on 12/02/2015 01/25/17   [name], MD    REVIEW OF SYSTEMS:  Review of Systems  Unable to perform ROS: Acuity of condition     VITAL SIGNS:   Vitals:   07/07/18 2209 07/07/18 2230 07/08/18 0002 07/08/18 0031  BP:  113/69 123/65 129/74  Pulse:  (!) 137 (!) 141 (!) 142  Resp:  (!) 28    Temp: 99.9 F (37.7 C)     TempSrc: Oral     SpO2:  97% 97% 96%  Weight:      Height:       Wt Readings from Last 3 Encounters:  07/07/18 107.5 kg  07/07/18 103.9 kg  06/21/18 105.7 kg    PHYSICAL EXAMINATION:  Physical Exam  Vitals reviewed. Constitutional: He appears well-developed and well-nourished. No distress.  HENT:  Head: Normocephalic and atraumatic.  Mouth/Throat: Oropharynx is clear and moist.  Eyes: Pupils are equal, round, and reactive to light. Conjunctivae and EOM are normal. No scleral icterus.  Neck: Normal range of motion. Neck supple. No JVD present. No thyromegaly present.  Cardiovascular: Regular rhythm and intact distal pulses. Exam reveals no gallop and no friction rub.  No murmur heard. Tachycardic  Respiratory: Effort normal. No respiratory distress. He has wheezes. He has no rales.  GI: Soft. Bowel sounds are normal. He exhibits no distension. There is no tenderness.  Musculoskeletal: Normal range of motion. He exhibits no edema.  No arthritis, no gout  Lymphadenopathy:    He has no cervical adenopathy.  Neurological: He is alert. No cranial nerve deficit.  Patient is able to converse, but his conversation is confused  Skin: Skin is warm and dry.  No rash noted. No erythema.  Psychiatric:  Unable to assess due to  patient condition    LABORATORY PANEL:   CBC Recent Labs  Lab 07/07/18 2005  WBC 1.7*  HGB 11.9*  HCT 34.4*  PLT 105*   ------------------------------------------------------------------------------------------------------------------  Chemistries  Recent Labs  Lab 07/07/18 2005  NA 138  K 4.0  CL 104  CO2 25  GLUCOSE 275*  BUN 36*  CREATININE 1.74*  CALCIUM 8.7*  AST 40  ALT 30  ALKPHOS 86  BILITOT 1.4*   ------------------------------------------------------------------------------------------------------------------  Cardiac Enzymes No results for input(s): TROPONINI in the last 168 hours. ------------------------------------------------------------------------------------------------------------------  RADIOLOGY:  Dg Chest 1 View  Result Date: 07/07/2018 CLINICAL DATA:  Shortness of breath. Altered mental status. EXAM: CHEST  1 VIEW COMPARISON:  01/27/2018. FINDINGS: Poor inspiration. Normal sized heart. Coronary artery stent. Clear lungs with normal vascularity. Thoracic spine degenerative changes. IMPRESSION: No acute abnormality. Electronically Signed   By: Claudie Revering M.D.   On: 07/07/2018 20:48   Ct Abdomen Pelvis W Contrast  Result Date: 07/07/2018 CLINICAL DATA:  Vomiting, fever, and altered mental status. EXAM: CT ABDOMEN AND PELVIS WITH CONTRAST TECHNIQUE: Multidetector CT imaging of the abdomen and pelvis was performed using the standard protocol following bolus administration of intravenous contrast. CONTRAST:  24mL OMNIPAQUE IOHEXOL 300 MG/ML  SOLN COMPARISON:  None. FINDINGS: Lower chest: Breathing motion artifact. Subsegmental atelectasis in the right lower lobe. No confluent consolidation. No pleural fluid. There are coronary artery calcifications. Hepatobiliary: No focal hepatic abnormality. Mild gallbladder distention, motion artifact through the gallbladder limits assessment for wall thickening. No calcified gallstone. No biliary dilatation.  Pancreas: Completely fatty replaced. No ductal dilatation or inflammation. Spleen: Normal in size without focal abnormality. Adrenals/Urinary Tract: Normal adrenal glands. No hydronephrosis. Bilateral symmetric perinephric edema. Delayed excretion on delayed phase imaging. Urinary bladder is nondistended. Equivocal bladder wall thickening versus nondistention. Stomach/Bowel: Stomach physiologically distended. No small bowel wall thickening, inflammatory change or wall thickening, slight motion artifact limitations. Cecum is high-riding in the right mid abdomen. Normal appendix. Hepatic flexure through descending colon are nondistended, limiting assessment. Short segment proximal sigmoid colonic wall thickening with questionable pericolonic edema, image 71 series 2, partially obscured by motion. Moderate stool in the more distal sigmoid colon, stool distends the rectum which spans 7 cm. Vascular/Lymphatic: Moderate aortic and branch atherosclerosis. No enlarged lymph nodes in the abdomen or pelvis. Reproductive: Central prostatic calcifications. Other: Small amount of free fluid in the pelvis, likely reactive. No free air or intra-abdominal abscess. Scattered soft tissue densities in the anterior abdominal wall most consistent with medication injection sites. Musculoskeletal: There are no acute or suspicious osseous abnormalities. IMPRESSION: 1. Short segment sigmoid colonic wall thickening with questionable pericolonic edema, suspicious for colitis. Motion artifact limits detailed assessment. Consider follow-up colonoscopy to exclude underlying neoplasm after resolution of acute event. Hepatic flexure through the distal descending colon are nondistended, limiting assessment for additional subtle colonic wall thickening. 2. Moderate colonic stool in the sigmoid colon, stool distends the rectum. 3. Bilateral perinephric edema, often chronic and related to underlying renal dysfunction, recommend correlation with  urinalysis to exclude urinary tract infection. 4. Complete fatty replacement of the pancreas, likely secondary to history of diabetes. 5. Aortic Atherosclerosis (ICD10-I70.0). Coronary artery calcifications. Electronically Signed   By: Keith Rake M.D.   On: 07/07/2018 23:32    EKG:   Orders placed or performed during the hospital encounter of 07/07/18  . ED EKG 12-Lead  . ED EKG 12-Lead  .  ED EKG 12-Lead  . ED EKG 12-Lead    IMPRESSION AND PLAN:  Principal Problem:   Severe sepsis ([place]) -IV antibiotics initiated, lactic acid was mildly elevated.  Patient has a history of heart failure, and so we will administer IV fluids cautiously and trend lactic until within normal limits.  Blood pressure has been stable.  Cultures sent. Active Problems:   Colitis -IV antibiotics in place, other treatment as above   Acute renal failure superimposed on chronic kidney disease (HCC) -IV fluids as above, avoid nephrotoxins and monitor   Coronary artery disease -continue home meds   DM (diabetes mellitus), type 2, uncontrolled with complications (HCC) -findings scale insulin with corresponding glucose checks   Chronic combined systolic and diastolic CHF (congestive heart failure) (HCC) -cautious IV fluid administration as above, continue home meds   GERD (gastroesophageal reflux disease) -Home dose PPI   Hyperlipidemia -Home dose antilipid  Chart review performed and case discussed with ED provider. Labs, imaging and/or ECG reviewed by provider and discussed with patient/family. Management plans discussed with the patient and/or family.  DVT PROPHYLAXIS: SubQ heparin  GI PROPHYLAXIS:  PPI   ADMISSION STATUS: Inpatient     CODE STATUS: Full Code Status History    Date Active Date Inactive Code Status Order ID Comments User Context   04/25/2018 1517 04/26/2018 1417 Full Code [id number]  [name], MD Inpatient      TOTAL TIME TAKING CARE OF THIS PATIENT: 45 minutes.   [name]  [place] 07/08/2018, 12:50 AM  [employer]  [phone]  CC: Primary care physician; [name], MD  Note:  This document was prepared using Dragon voice recognition software and may include unintentional dictation errors.

## 2018-07-08 NOTE — Progress Notes (Signed)
[employer] for heparin Indication: chest pain/ACS  Allergies  Allergen Reactions  . Metoprolol Rash  . Novolog [Insulin Aspart] Hives    Patient Measurements: Height: 5\' 8"  (172.7 cm) Weight: 237 lb (107.5 kg) IBW/kg (Calculated) : 68.4 Heparin Dosing Weight: 92.1 kg  Vital Signs: Temp: 97.7 F (36.5 C) (10/04 0742) Temp Source: Oral (10/04 0742) BP: 86/54 (10/04 1100) Pulse Rate: 119 (10/04 1100)  Labs: Recent Labs    07/07/18 2005 07/08/18 0606 07/08/18 0812  HGB 11.9* 11.6*  --   HCT 34.4* 34.1*  --   PLT 105* 122*  --   LABPROT 15.7*  --   --   INR 1.26  --   --   CREATININE 1.74* 1.98*  --   TROPONINI  --   --  11.48*    Estimated Creatinine Clearance: 47.7 mL/min (A) (by C-G formula based on SCr of 1.98 mg/dL (H)).   Medical History: Past Medical History:  Diagnosis Date  . Allergy    dust, seasonal (worse in the fall).  . 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 Followed by Pain Specialist in [place], back and neck  . Asthma    BRONCHITIS  . Cataract    First Dx in 2012  . Chronic combined systolic and diastolic congestive heart failure ([place])    a. 03/2018 Echo: EF 30-35%, ant, antlat, apical AK, Gr1 DD.  [name] Coronary artery disease    a. Prior Ant MI->s/p multiple stents placed in the LAD and right coronary artery ([place]); b. 2016 Cath: reportedly nonobs dzs;  c. 02/2017 MV: EF 45-54%, ap/ant, ap/inf, apical infarct, no ishcemia; c. 04/2018 Cath/PCI: LM nl, LAD 20p, patent mid stent, LCX 69m(3.25x15 Sierra DES), OM1 nl, OM2 50, OM3 40 w/ patent stent, RCA 40p, 46m, 40d w/ patent stent in RPDA, RPAV 60, EF 25-35%. 2+MR.  . Diabetes mellitus without complication ([place])    TYPE 2  . Diabetic peripheral neuropathy (HCC)    feet and hands  . GERD (gastroesophageal reflux disease)   . Headache    muscle tension  . Hyperlipidemia   . Hypertension    CONTROLLED ON MEDS  . Insomnia   . Ischemic cardiomyopathy    a. 03/2018 Echo: EF  30-35%, ant, antlat, apical AK, Gr1 DD, mild MR, mildly dil LA.  [name] Lyme disease    Chronic  . Myasthenia gravis ([place])   . Myocardial infarction ([place]) 2010  . Seasonal allergies   . Sleep apnea    CPAP    Assessment: 57 year old male with extensive cardiac PMH. H/o 11 stents. No anticoagulation PTA. Troponin 10/4 am elevated at 11.48. Pharmacy consulted for heparin drip.  Goal of Therapy:  Heparin level 0.3-0.7 units/ml Monitor platelets by anticoagulation protocol: Yes   Plan:  Per conversation with NP Blakeney, will not give initial bolus. Platelets low at 122.  Will start heparin drip conservatively at 1100 units/hr. Heparin level ordered for 1630. CBC ordered with morning labs.  Tawnya Crook, PharmD Pharmacy Resident  07/08/2018 1:03 PM

## 2018-07-08 NOTE — Progress Notes (Signed)
Pt refused cpap

## 2018-07-08 NOTE — Progress Notes (Signed)
Inpatient Diabetes Program Recommendations  AACE/ADA: New Consensus Statement on Inpatient Glycemic Control (2015)  Target Ranges:  Prepandial:   less than 140 mg/dL      Peak postprandial:   less than 180 mg/dL (1-2 hours)      Critically ill patients:  140 - 180 mg/dL   Results for [name], Edgar "[name]" (MRN [id number]) as of 07/08/2018 08:41  Ref. Range 07/07/2018 20:18 07/08/2018 05:40  Glucose-Capillary Latest Ref Range: 70 - 99 mg/dL 229 (H) 167 (H)    Admit with: Sepsis secondary to Colitis of the Sigmoid Colon  History: DM, CHF  Home DM Meds: Lantus 25-30 units QHS       Humalog 1 unit for every 8 grams of Carbohydrates       Humalog 1 unit for every 20 mg/dl > CBG of 120 mg/dl       Victoza 1.8 mg Daily       Metformin 1000 mg BID  Current Orders: Novolog Sensitive Correction Scale/ SSI (0-9 units) TID AC + HS       Endocrinologist: Dr. Mee Hives with [facility]--Last seen 06/01/2018  Currently NPO.      MD- Please consider the following in-hospital insulin adjustments:  1. Start Lantus 10 units QHS (1/3 total home dose)  2. Change/Increase Novolog SSI to Moderate scale (0-15 units) Q4 hours      --Will follow patient during hospitalization--  Wyn Quaker RN, MSN, CDE Diabetes Coordinator Inpatient Glycemic Control Team Team Pager: [phone] (8a-5p)

## 2018-07-08 NOTE — ED Notes (Signed)
Attempted to call report. Nurse stated that she would call back due to only floor accepting patients and she has two depending to go upstairs.

## 2018-07-08 NOTE — ED Notes (Signed)
Pt is resting in bed. Son at bedside. Nadn.

## 2018-07-09 ENCOUNTER — Inpatient Hospital Stay: Payer: Medicare Other

## 2018-07-09 DIAGNOSIS — K529 Noninfective gastroenteritis and colitis, unspecified: Secondary | ICD-10-CM

## 2018-07-09 DIAGNOSIS — I255 Ischemic cardiomyopathy: Secondary | ICD-10-CM

## 2018-07-09 LAB — GLUCOSE, CAPILLARY
GLUCOSE-CAPILLARY: 298 mg/dL — AB (ref 70–99)
GLUCOSE-CAPILLARY: 261 mg/dL — AB (ref 70–99)
Glucose-Capillary: 264 mg/dL — ABNORMAL HIGH (ref 70–99)
Glucose-Capillary: 283 mg/dL — ABNORMAL HIGH (ref 70–99)
Glucose-Capillary: 288 mg/dL — ABNORMAL HIGH (ref 70–99)

## 2018-07-09 LAB — HEPARIN LEVEL (UNFRACTIONATED)
HEPARIN UNFRACTIONATED: 0.22 [IU]/mL — AB (ref 0.30–0.70)
HEPARIN UNFRACTIONATED: 0.3 [IU]/mL (ref 0.30–0.70)
Heparin Unfractionated: 0.39 IU/mL (ref 0.30–0.70)

## 2018-07-09 LAB — CBC
HCT: 31.7 % — ABNORMAL LOW (ref 40.0–52.0)
HEMOGLOBIN: 10.5 g/dL — AB (ref 13.0–18.0)
MCH: 29.1 pg (ref 26.0–34.0)
MCHC: 33.3 g/dL (ref 32.0–36.0)
MCV: 87.3 fL (ref 80.0–100.0)
Platelets: 120 10*3/uL — ABNORMAL LOW (ref 150–440)
RBC: 3.63 MIL/uL — AB (ref 4.40–5.90)
RDW: 15.9 % — ABNORMAL HIGH (ref 11.5–14.5)
WBC: 12.2 10*3/uL — AB (ref 3.8–10.6)

## 2018-07-09 LAB — HIV ANTIBODY (ROUTINE TESTING W REFLEX): HIV Screen 4th Generation wRfx: NONREACTIVE

## 2018-07-09 LAB — BASIC METABOLIC PANEL
Anion gap: 10 (ref 5–15)
BUN: 65 mg/dL — AB (ref 6–20)
CHLORIDE: 108 mmol/L (ref 98–111)
CO2: 21 mmol/L — ABNORMAL LOW (ref 22–32)
Calcium: 7.5 mg/dL — ABNORMAL LOW (ref 8.9–10.3)
Creatinine, Ser: 2.59 mg/dL — ABNORMAL HIGH (ref 0.61–1.24)
GFR calc Af Amer: 30 mL/min — ABNORMAL LOW (ref >60)
GFR calc non Af Amer: 25 mL/min — ABNORMAL LOW (ref >60)
Glucose, Bld: 282 mg/dL — ABNORMAL HIGH (ref 70–99)
Potassium: 4.7 mmol/L (ref 3.5–5.1)
SODIUM: 139 mmol/L (ref 135–145)

## 2018-07-09 LAB — LACTIC ACID, PLASMA: Lactic Acid, Venous: 1.9 mmol/L (ref 0.5–1.9)

## 2018-07-09 LAB — PROCALCITONIN: PROCALCITONIN: 124.1 ng/mL

## 2018-07-09 MED ORDER — SODIUM CHLORIDE 0.9 % IV SOLN
INTRAVENOUS | Status: DC
  Administered 2018-07-09 – 2018-07-10 (×2): via INTRAVENOUS

## 2018-07-09 NOTE — Progress Notes (Signed)
Pt has not voided this morning, NS at 75 started. Bladder scan showed 381. Pt states he does not have the urge to void, he wants to wait a little bit longer for the urge and then will try to stand and use the urinal. RN agreeable to plan. Will continue to monitor.

## 2018-07-09 NOTE — Progress Notes (Signed)
[facility] NAME: [name]    MR#:  [id number]  DATE OF BIRTH:  [birth year]  SUBJECTIVE:  patient came in with fever altered mental status and was found to have acute colitis.  He is feeling a lot better.  Low blood pressure is improving No chest pain or shortness of breath. Transferred from ICU to telemetry 07/09/2018  REVIEW OF SYSTEMS:   Review of Systems  Constitutional: Negative for chills, fever and weight loss.  HENT: Negative for ear discharge, ear pain and nosebleeds.   Eyes: Negative for blurred vision, pain and discharge.  Respiratory: Negative for sputum production, shortness of breath, wheezing and stridor.   Cardiovascular: Negative for chest pain, palpitations, orthopnea and PND.  Gastrointestinal: Negative for abdominal pain, diarrhea, nausea and vomiting.  Genitourinary: Negative for frequency and urgency.  Musculoskeletal: Negative for back pain and joint pain.  Neurological: Negative for sensory change, speech change, focal weakness and weakness.  Psychiatric/Behavioral: Negative for depression and hallucinations. The patient is not nervous/anxious.    Tolerating Diet:clear Tolerating PT:   DRUG ALLERGIES:   Allergies  Allergen Reactions  . Metoprolol Rash  . Novolog [Insulin Aspart] Hives    VITALS:  Blood pressure 96/64, pulse (!) 105, temperature 98.2 F (36.8 C), temperature source Oral, resp. rate 13, height 5\' 7"  (1.702 m), weight 107 kg, SpO2 95 %.  PHYSICAL EXAMINATION:   Physical Exam  GENERAL:  57 y.o.-year-old patient lying in the bed with no acute distress.  EYES: Pupils equal, round, reactive to light and accommodation. No scleral icterus. Extraocular muscles intact.  HEENT: Head atraumatic, normocephalic. Oropharynx and nasopharynx clear.  NECK:  Supple, no jugular venous distention. No thyroid enlargement, no tenderness.  LUNGS: Normal breath sounds bilaterally, no  wheezing, rales, rhonchi. No use of accessory muscles of respiration.  CARDIOVASCULAR: S1, S2 normal. No murmurs, rubs, or gallops.  ABDOMEN: Soft, nontender, nondistended. Bowel sounds present. No organomegaly or mass.  EXTREMITIES: No cyanosis, clubbing or edema b/l.    NEUROLOGIC: Cranial nerves II through XII are intact. No focal Motor or sensory deficits b/l.   PSYCHIATRIC:  patient is alert and oriented x 3.  SKIN: No obvious rash, lesion, or ulcer.   LABORATORY PANEL:  CBC Recent Labs  Lab 07/09/18 0447  WBC 12.2*  HGB 10.5*  HCT 31.7*  PLT 120*    Chemistries  Recent Labs  Lab 07/07/18 2005  07/09/18 0447  NA 138   < > 139  K 4.0   < > 4.7  CL 104   < > 108  CO2 25   < > 21*  GLUCOSE 275*   < > 282*  BUN 36*   < > 65*  CREATININE 1.74*   < > 2.59*  CALCIUM 8.7*   < > 7.5*  AST 40  --   --   ALT 30  --   --   ALKPHOS 86  --   --   BILITOT 1.4*  --   --    < > = values in this interval not displayed.   Cardiac Enzymes Recent Labs  Lab 07/08/18 2244  TROPONINI 7.08*   RADIOLOGY:  Dg Chest 1 View  Result Date: 07/07/2018 CLINICAL DATA:  Shortness of breath. Altered mental status. EXAM: CHEST  1 VIEW COMPARISON:  01/27/2018. FINDINGS: Poor inspiration. Normal sized heart. Coronary artery stent. Clear lungs with normal vascularity. Thoracic spine degenerative changes. IMPRESSION: No acute abnormality. Electronically  Signed   By: Claudie Revering M.D.   On: 07/07/2018 20:48   Ct Head Wo Contrast  Result Date: 07/08/2018 CLINICAL DATA:  Slurred speech, lethargy and confusion today. EXAM: CT HEAD WITHOUT CONTRAST TECHNIQUE: Contiguous axial images were obtained from the base of the skull through the vertex without intravenous contrast. COMPARISON:  None. FINDINGS: Brain: No evidence of acute infarction, hemorrhage, hydrocephalus, extra-axial collection or mass lesion/mass effect. Vascular: No hyperdense vessel or unexpected calcification. Skull: Normal. Negative for  fracture or focal lesion. Sinuses/Orbits: Negative. Other: None. IMPRESSION: Negative head CT. Electronically Signed   By: Inge Rise M.D.   On: 07/08/2018 12:42   Ct Abdomen Pelvis W Contrast  Result Date: 07/07/2018 CLINICAL DATA:  Vomiting, fever, and altered mental status. EXAM: CT ABDOMEN AND PELVIS WITH CONTRAST TECHNIQUE: Multidetector CT imaging of the abdomen and pelvis was performed using the standard protocol following bolus administration of intravenous contrast. CONTRAST:  63mL OMNIPAQUE IOHEXOL 300 MG/ML  SOLN COMPARISON:  None. FINDINGS: Lower chest: Breathing motion artifact. Subsegmental atelectasis in the right lower lobe. No confluent consolidation. No pleural fluid. There are coronary artery calcifications. Hepatobiliary: No focal hepatic abnormality. Mild gallbladder distention, motion artifact through the gallbladder limits assessment for wall thickening. No calcified gallstone. No biliary dilatation. Pancreas: Completely fatty replaced. No ductal dilatation or inflammation. Spleen: Normal in size without focal abnormality. Adrenals/Urinary Tract: Normal adrenal glands. No hydronephrosis. Bilateral symmetric perinephric edema. Delayed excretion on delayed phase imaging. Urinary bladder is nondistended. Equivocal bladder wall thickening versus nondistention. Stomach/Bowel: Stomach physiologically distended. No small bowel wall thickening, inflammatory change or wall thickening, slight motion artifact limitations. Cecum is high-riding in the right mid abdomen. Normal appendix. Hepatic flexure through descending colon are nondistended, limiting assessment. Short segment proximal sigmoid colonic wall thickening with questionable pericolonic edema, image 71 series 2, partially obscured by motion. Moderate stool in the more distal sigmoid colon, stool distends the rectum which spans 7 cm. Vascular/Lymphatic: Moderate aortic and branch atherosclerosis. No enlarged lymph nodes in the abdomen  or pelvis. Reproductive: Central prostatic calcifications. Other: Small amount of free fluid in the pelvis, likely reactive. No free air or intra-abdominal abscess. Scattered soft tissue densities in the anterior abdominal wall most consistent with medication injection sites. Musculoskeletal: There are no acute or suspicious osseous abnormalities. IMPRESSION: 1. Short segment sigmoid colonic wall thickening with questionable pericolonic edema, suspicious for colitis. Motion artifact limits detailed assessment. Consider follow-up colonoscopy to exclude underlying neoplasm after resolution of acute event. Hepatic flexure through the distal descending colon are nondistended, limiting assessment for additional subtle colonic wall thickening. 2. Moderate colonic stool in the sigmoid colon, stool distends the rectum. 3. Bilateral perinephric edema, often chronic and related to underlying renal dysfunction, recommend correlation with urinalysis to exclude urinary tract infection. 4. Complete fatty replacement of the pancreas, likely secondary to history of diabetes. 5. Aortic Atherosclerosis (ICD10-I70.0). Coronary artery calcifications. Electronically Signed   By: Keith Rake M.D.   On: 07/07/2018 23:32   US Renal  Result Date: 07/09/2018 CLINICAL DATA:  Acute renal failure. EXAM: RENAL / URINARY TRACT ULTRASOUND COMPLETE COMPARISON:  CT abdomen and pelvis 07/07/2018. FINDINGS: Right Kidney: Length: 11.2 cm. Echogenicity within normal limits. No mass or hydronephrosis visualized. Left Kidney: Length: 12.1 cm. Echogenicity within normal limits. No mass or hydronephrosis visualized. Bladder: Not visualized. IMPRESSION: Negative for hydronephrosis. The kidneys appear normal. The urinary bladder is not visualized likely due to decompression. Electronically Signed   By: Inge Rise M.D.  On: 07/09/2018 14:09   Dg Chest Port 1 View  Result Date: 07/09/2018 CLINICAL DATA:  Chronic congestive heart failure.  Coronary artery disease and stents. EXAM: PORTABLE CHEST 1 VIEW COMPARISON:  07/07/2018. FINDINGS: A poor inspiration is again demonstrated with a grossly normal sized heart. Interval minimal right basilar atelectasis. Otherwise, clear lungs. Coronary artery stent. Thoracic spine degenerative changes. IMPRESSION: Poor inspiration with minimal right basilar atelectasis. Electronically Signed   By: Claudie Revering M.D.   On: 07/09/2018 11:42   ASSESSMENT AND PLAN:   [name]  is a 57 y.o. male who presents with chief complaint as above.  Patient was brought to the ED by his son who states that he became more lethargic today after being out and about.  He had some slurred speech and was confused. Here in the ED he was febrile, tachycardic, with leukocytosis.  *Acute encephalopathy metabolic from underlying sepsis from acute colitis Almost back to his baseline CT head negative  *Severe sepsis ([place]) suspected due to severe colitis -broad-spectrum-IV antibiotics cefepime and Flagyl lactic acid was mildly elevated.  Patient has a history of heart failure, and so we will administer IV fluids cautiously and trend lactic until within normal limits.  Blood pressure has been better Urine culture pending MRSA PCR negative Blood culture no growth so far  *Acute  Colitis -IV antibiotics cefepime and flagyl, other treatment as above  *  Acute renal failure superimposed on chronic kidney disease (HCC)-ATN -IV fluids as above, avoid nephrotoxins and monitor Holding Entresto  *Non-STEMI acute- in the setting of history of   Coronary artery disease -continue home meds -seen by cardiology. Follow the recommendation from cardiology -f/u echo -cont medical management -troponin 11.48-7.08 -IV heparin gtt -plavix,coreg, crestor -Holding entresto -Not considering cardiac catheterization during inpatient setting as the patient is sick  *  DM (diabetes mellitus), type 2, uncontrolled with complications ([place])  -findings scale insulin with corresponding glucose checks -Lantus and ssi  *  Chronic combined systolic and diastolic CHF (congestive heart failure) (HCC) -cautious IV fluid administration as above, continue home meds  *  GERD (gastroesophageal reflux disease) -Home dose PPI    *Hyperlipidemia -Home dose antilipid    Case discussed with Care Management/Social Worker. Management plans discussed with the patient, family and they are in agreement.  CODE STATUS: full  DVT Prophylaxis: heparin gtt  TOTAL TIME TAKING CARE OF THIS PATIENT: 35 minutes.  >50% time spent on counselling and coordination of care  POSSIBLE D/C IN *fewDAYS, DEPENDING ON CLINICAL CONDITION.  Note: This dictation was prepared with Dragon dictation along with smaller phrase technology. Any transcriptional errors that result from this process are unintentional.  Nicholes Mango M.D on 07/09/2018 at 2:58 PM  Between 7am to 6pm - Pager - [phone]  After 6pm go to [email or web address] - password [email or web address] [facility]  Office  [phone]  CC: Primary care physician; [name], MDPatient ID: [name], male   DOB: 1961-05-30, 57 y.o.   MRN: [id number]

## 2018-07-09 NOTE — Progress Notes (Signed)
[facility] for heparin Indication: chest pain/ACS  Allergies  Allergen Reactions  . Metoprolol Rash  . Novolog [Insulin Aspart] Hives    Patient Measurements: Height: 5\' 7"  (170.2 cm) Weight: 235 lb 14.3 oz (107 kg) IBW/kg (Calculated) : 66.1 Heparin Dosing Weight: 92.1 kg  Vital Signs: Temp: 98.2 F (36.8 C) (10/05 1300) Temp Source: Oral (10/05 1300) BP: 96/64 (10/05 1300) Pulse Rate: 105 (10/05 1300)  Labs: Recent Labs    07/07/18 2005 07/08/18 0606 07/08/18 0812  07/08/18 1632 07/08/18 2244 07/09/18 0447 07/09/18 1321  HGB 11.9* 11.6*  --   --   --   --  10.5*  --   HCT 34.4* 34.1*  --   --   --   --  31.7*  --   PLT 105* 122*  --   --   --   --  120*  --   LABPROT 15.7*  --   --   --   --   --   --   --   INR 1.26  --   --   --   --   --   --   --   HEPARINUNFRC  --   --   --    < > 0.27* 0.23* 0.22* 0.39  CREATININE 1.74* 1.98*  --   --   --   --  2.59*  --   TROPONINI  --   --  11.48*  --  9.32* 7.08*  --   --    < > = values in this interval not displayed.    Estimated Creatinine Clearance: 35.8 mL/min (A) (by C-G formula based on SCr of 2.59 mg/dL (H)).   Medical History: Past Medical History:  Diagnosis Date  . Allergy    dust, seasonal (worse in the fall).  . 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 Followed by Pain Specialist in [place], back and neck  . Asthma    BRONCHITIS  . Cataract    First Dx in 2012  . Chronic combined systolic and diastolic congestive heart failure ([place])    a. 03/2018 Echo: EF 30-35%, ant, antlat, apical AK, Gr1 DD.  [name] Coronary artery disease    a. Prior Ant MI->s/p multiple stents placed in the LAD and right coronary artery ([place]); b. 2016 Cath: reportedly nonobs dzs;  c. 02/2017 MV: EF 45-54%, ap/ant, ap/inf, apical infarct, no ishcemia; c. 04/2018 Cath/PCI: LM nl, LAD 20p, patent mid stent, LCX 43m(3.25x15 Sierra DES), OM1 nl, OM2 50, OM3 40 w/ patent stent, RCA 40p, 59m, 40d w/ patent stent in  RPDA, RPAV 60, EF 25-35%. 2+MR.  . Diabetes mellitus without complication ([place])    TYPE 2  . Diabetic peripheral neuropathy (HCC)    feet and hands  . GERD (gastroesophageal reflux disease)   . Headache    muscle tension  . Hyperlipidemia   . Hypertension    CONTROLLED ON MEDS  . Insomnia   . Ischemic cardiomyopathy    a. 03/2018 Echo: EF 30-35%, ant, antlat, apical AK, Gr1 DD, mild MR, mildly dil LA.  [name] Lyme disease    Chronic  . Myasthenia gravis ([place])   . Myocardial infarction ([place]) 2010  . Seasonal allergies   . Sleep apnea    CPAP    Assessment: 57 year old male with extensive cardiac PMH. H/o 11 stents. No anticoagulation PTA. Troponin 10/4 am elevated at 11.48. Pharmacy consulted for heparin drip.  Goal of  Therapy:  Heparin level 0.3-0.7 units/ml Monitor platelets by anticoagulation protocol: Yes   Plan:  Per conversation with NP Blakeney, will not give boluses. Platelets low at 122.  10/4 1632 HL 0.27. Subtherapeutic. Will increase heparin drip to 1250 units/hr and recheck HL at 2300. CBC ordered with morning labs. Will follow platelets closely for any change.  10/04 PM heparin level 0.23. Increase rate to 1450 units/hr and recheck with AM labs.  10/05 AM heparin level 0.22. Increase rate to 1650 units/hr and recheck in 6 hours.  10/5: Heparin level @ 1321 results = 0.39. Will continue current heparin gtt rate and will recheck in 6 hours.   Larene Beach, PharmD 07/09/2018 2:38 PM

## 2018-07-09 NOTE — Progress Notes (Addendum)
[facility] for heparin Indication: chest pain/ACS  Allergies  Allergen Reactions  . Metoprolol Rash  . Novolog [Insulin Aspart] Hives    Patient Measurements: Height: 5\' 8"  (172.7 cm) Weight: 237 lb (107.5 kg) IBW/kg (Calculated) : 68.4 Heparin Dosing Weight: 92.1 kg  Vital Signs: Temp: 97.6 F (36.4 C) (10/04 2000) Temp Source: Oral (10/04 2000) BP: 102/62 (10/04 2000) Pulse Rate: 104 (10/04 2000)  Labs: Recent Labs    07/07/18 2005 07/08/18 0606 07/08/18 0812 07/08/18 1632 07/08/18 2244  HGB 11.9* 11.6*  --   --   --   HCT 34.4* 34.1*  --   --   --   PLT 105* 122*  --   --   --   LABPROT 15.7*  --   --   --   --   INR 1.26  --   --   --   --   HEPARINUNFRC  --   --   --  0.27* 0.23*  CREATININE 1.74* 1.98*  --   --   --   TROPONINI  --   --  11.48* 9.32* 7.08*    Estimated Creatinine Clearance: 47.7 mL/min (A) (by C-G formula based on SCr of 1.98 mg/dL (H)).   Medical History: Past Medical History:  Diagnosis Date  . Allergy    dust, seasonal (worse in the fall).  . 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 Followed by Pain Specialist in [place], back and neck  . Asthma    BRONCHITIS  . Cataract    First Dx in 2012  . Chronic combined systolic and diastolic congestive heart failure ([place])    a. 03/2018 Echo: EF 30-35%, ant, antlat, apical AK, Gr1 DD.  [name] Coronary artery disease    a. Prior Ant MI->s/p multiple stents placed in the LAD and right coronary artery ([place]); b. 2016 Cath: reportedly nonobs dzs;  c. 02/2017 MV: EF 45-54%, ap/ant, ap/inf, apical infarct, no ishcemia; c. 04/2018 Cath/PCI: LM nl, LAD 20p, patent mid stent, LCX 3m(3.25x15 Sierra DES), OM1 nl, OM2 50, OM3 40 w/ patent stent, RCA 40p, 73m, 40d w/ patent stent in RPDA, RPAV 60, EF 25-35%. 2+MR.  . Diabetes mellitus without complication ([place])    TYPE 2  . Diabetic peripheral neuropathy (HCC)    feet and hands  . GERD (gastroesophageal reflux disease)   . Headache     muscle tension  . Hyperlipidemia   . Hypertension    CONTROLLED ON MEDS  . Insomnia   . Ischemic cardiomyopathy    a. 03/2018 Echo: EF 30-35%, ant, antlat, apical AK, Gr1 DD, mild MR, mildly dil LA.  [name] Lyme disease    Chronic  . Myasthenia gravis ([place])   . Myocardial infarction ([place]) 2010  . Seasonal allergies   . Sleep apnea    CPAP    Assessment: 57 year old male with extensive cardiac PMH. H/o 11 stents. No anticoagulation PTA. Troponin 10/4 am elevated at 11.48. Pharmacy consulted for heparin drip.  Goal of Therapy:  Heparin level 0.3-0.7 units/ml Monitor platelets by anticoagulation protocol: Yes   Plan:  Per conversation with NP Blakeney, will not give boluses. Platelets low at 122.  10/4 1632 HL 0.27. Subtherapeutic. Will increase heparin drip to 1250 units/hr and recheck HL at 2300. CBC ordered with morning labs. Will follow platelets closely for any change.  10/04 PM heparin level 0.23. Increase rate to 1450 units/hr and recheck with AM labs.  10/05 AM heparin  level 0.22. Increase rate to 1650 units/hr and recheck in 6 hours.  Theran Vandergrift S, PharmD 07/09/2018 12:18 AM

## 2018-07-09 NOTE — Progress Notes (Signed)
Report given to 2A and pt transferred with no issues.

## 2018-07-09 NOTE — Progress Notes (Signed)
Progress Note  Patient Name: [name] Date of Encounter: 07/09/2018  Primary Cardiologist: [name], MD   Subjective   Reports feeling somewhat better Started to drink some fluids overnight and this morning No significant abdominal pain Blood pressure continues to run low, 87 systolic  Echocardiogram results reviewed with him, stable but moderately depressed ejection fraction 35 to 40%  Sinus tachycardia on telemetry Reports his heart rate is typically always elevated All of his heart failure medications have been held for hypotension  Dramatic climbing creatinine 2.59, well above baseline  Inpatient Medications    Scheduled Meds: . aspirin EC  81 mg Oral Daily  . clopidogrel  75 mg Oral Q breakfast  . fluticasone furoate-vilanterol  1 puff Inhalation Daily  . insulin glargine  10 Units Subcutaneous QHS  . insulin lispro  0-15 Units Subcutaneous Q4H  . montelukast  10 mg Oral QHS  . pantoprazole  40 mg Oral Daily  . pyridostigmine  60 mg Oral QID  . rosuvastatin  5 mg Oral Daily   Continuous Infusions: . sodium chloride 75 mL/hr at 07/09/18 1034  . ceFEPime (MAXIPIME) IV Stopped (07/09/18 0829)  . heparin 1,650 Units/hr (07/09/18 0650)  . metronidazole Stopped (07/09/18 1410)   PRN Meds: acetaminophen **OR** acetaminophen, ondansetron **OR** ondansetron (ZOFRAN) IV, oxyCODONE   Vital Signs    Vitals:   07/09/18 0900 07/09/18 1000 07/09/18 1040 07/09/18 1300  BP: 98/64 (!) 87/59 96/65 96/64   Pulse: (!) 106 (!) 106 (!) 104 (!) 105  Resp: 10 10 11 13   Temp:    98.2 F (36.8 C)  TempSrc:    Oral  SpO2:    95%  Weight:    107 kg  Height:    5\' 7"  (1.702 m)    Intake/Output Summary (Last 24 hours) at 07/09/2018 1604 Last data filed at 07/09/2018 1500 Gross per 24 hour  Intake 1948.86 ml  Output 400 ml  Net 1548.86 ml   Filed Weights   07/07/18 2006 07/09/18 1300  Weight: 107.5 kg 107 kg    Telemetry    Sinus tachycardia- Personally  Reviewed  ECG      Physical Exam   Constitutional:  oriented to person, place, and time.  Appears pale, weak HENT:  Head: Normocephalic and atraumatic.  Eyes:  no discharge. No scleral icterus.  Neck: Normal range of motion. Neck supple. No JVD present.  Cardiovascular: Tachycardic, regular rhythm,  normal heart sounds and intact distal pulses. Exam reveals no gallop and no friction rub. No edema No murmur heard. Pulmonary/Chest: Effort normal and breath sounds normal. No stridor. No respiratory distress.  no wheezes.  no rales.  no tenderness.  Abdominal: Soft.  no distension.  no tenderness.  Musculoskeletal: Normal range of motion.  no  tenderness or deformity.  Neurological:  normal muscle tone. Coordination normal. No atrophy Skin: Skin is warm and dry. No rash noted. not diaphoretic.  Psychiatric:  normal mood and affect. behavior is normal. Thought content normal.    Labs    Chemistry Recent Labs  Lab 07/07/18 2005 07/08/18 0606 07/09/18 0447  NA 138 142 139  K 4.0 4.2 4.7  CL 104 108 108  CO2 25 20* 21*  GLUCOSE 275* 248* 282*  BUN 36* 39* 65*  CREATININE 1.74* 1.98* 2.59*  CALCIUM 8.7* 8.6* 7.5*  PROT 6.1*  --   --   ALBUMIN 3.4*  --   --   AST 40  --   --  ALT 30  --   --   ALKPHOS 86  --   --   BILITOT 1.4*  --   --   GFRNONAA 41* 35* 25*  GFRAA 48* 41* 30*  ANIONGAP 9 14 10      Hematology Recent Labs  Lab 07/07/18 2005 07/08/18 0606 07/09/18 0447  WBC 1.7* 5.7 12.2*  RBC 3.98* 3.81* 3.63*  HGB 11.9* 11.6* 10.5*  HCT 34.4* 34.1* 31.7*  MCV 86.5 89.5 87.3  MCH 30.0 30.4 29.1  MCHC 34.7 34.0 33.3  RDW 14.7* 15.3* 15.9*  PLT 105* 122* 120*    Cardiac Enzymes Recent Labs  Lab 07/08/18 0812 07/08/18 1632 07/08/18 2244  TROPONINI 11.48* 9.32* 7.08*   No results for input(s): TROPIPOC in the last 168 hours.   BNPNo results for input(s): BNP, PROBNP in the last 168 hours.   DDimer No results for input(s): DDIMER in the last 168 hours.     Radiology    Dg Chest 1 View  Result Date: 07/07/2018 CLINICAL DATA:  Shortness of breath. Altered mental status. EXAM: CHEST  1 VIEW COMPARISON:  01/27/2018. FINDINGS: Poor inspiration. Normal sized heart. Coronary artery stent. Clear lungs with normal vascularity. Thoracic spine degenerative changes. IMPRESSION: No acute abnormality. Electronically Signed   By: Claudie Revering M.D.   On: 07/07/2018 20:48   Ct Head Wo Contrast  Result Date: 07/08/2018 CLINICAL DATA:  Slurred speech, lethargy and confusion today. EXAM: CT HEAD WITHOUT CONTRAST TECHNIQUE: Contiguous axial images were obtained from the base of the skull through the vertex without intravenous contrast. COMPARISON:  None. FINDINGS: Brain: No evidence of acute infarction, hemorrhage, hydrocephalus, extra-axial collection or mass lesion/mass effect. Vascular: No hyperdense vessel or unexpected calcification. Skull: Normal. Negative for fracture or focal lesion. Sinuses/Orbits: Negative. Other: None. IMPRESSION: Negative head CT. Electronically Signed   By: Inge Rise M.D.   On: 07/08/2018 12:42   Ct Abdomen Pelvis W Contrast  Result Date: 07/07/2018 CLINICAL DATA:  Vomiting, fever, and altered mental status. EXAM: CT ABDOMEN AND PELVIS WITH CONTRAST TECHNIQUE: Multidetector CT imaging of the abdomen and pelvis was performed using the standard protocol following bolus administration of intravenous contrast. CONTRAST:  80mL OMNIPAQUE IOHEXOL 300 MG/ML  SOLN COMPARISON:  None. FINDINGS: Lower chest: Breathing motion artifact. Subsegmental atelectasis in the right lower lobe. No confluent consolidation. No pleural fluid. There are coronary artery calcifications. Hepatobiliary: No focal hepatic abnormality. Mild gallbladder distention, motion artifact through the gallbladder limits assessment for wall thickening. No calcified gallstone. No biliary dilatation. Pancreas: Completely fatty replaced. No ductal dilatation or inflammation. Spleen:  Normal in size without focal abnormality. Adrenals/Urinary Tract: Normal adrenal glands. No hydronephrosis. Bilateral symmetric perinephric edema. Delayed excretion on delayed phase imaging. Urinary bladder is nondistended. Equivocal bladder wall thickening versus nondistention. Stomach/Bowel: Stomach physiologically distended. No small bowel wall thickening, inflammatory change or wall thickening, slight motion artifact limitations. Cecum is high-riding in the right mid abdomen. Normal appendix. Hepatic flexure through descending colon are nondistended, limiting assessment. Short segment proximal sigmoid colonic wall thickening with questionable pericolonic edema, image 71 series 2, partially obscured by motion. Moderate stool in the more distal sigmoid colon, stool distends the rectum which spans 7 cm. Vascular/Lymphatic: Moderate aortic and branch atherosclerosis. No enlarged lymph nodes in the abdomen or pelvis. Reproductive: Central prostatic calcifications. Other: Small amount of free fluid in the pelvis, likely reactive. No free air or intra-abdominal abscess. Scattered soft tissue densities in the anterior abdominal wall most consistent with medication injection sites.  Musculoskeletal: There are no acute or suspicious osseous abnormalities. IMPRESSION: 1. Short segment sigmoid colonic wall thickening with questionable pericolonic edema, suspicious for colitis. Motion artifact limits detailed assessment. Consider follow-up colonoscopy to exclude underlying neoplasm after resolution of acute event. Hepatic flexure through the distal descending colon are nondistended, limiting assessment for additional subtle colonic wall thickening. 2. Moderate colonic stool in the sigmoid colon, stool distends the rectum. 3. Bilateral perinephric edema, often chronic and related to underlying renal dysfunction, recommend correlation with urinalysis to exclude urinary tract infection. 4. Complete fatty replacement of the  pancreas, likely secondary to history of diabetes. 5. Aortic Atherosclerosis (ICD10-I70.0). Coronary artery calcifications. Electronically Signed   By: Keith Rake M.D.   On: 07/07/2018 23:32   US Renal  Result Date: 07/09/2018 CLINICAL DATA:  Acute renal failure. EXAM: RENAL / URINARY TRACT ULTRASOUND COMPLETE COMPARISON:  CT abdomen and pelvis 07/07/2018. FINDINGS: Right Kidney: Length: 11.2 cm. Echogenicity within normal limits. No mass or hydronephrosis visualized. Left Kidney: Length: 12.1 cm. Echogenicity within normal limits. No mass or hydronephrosis visualized. Bladder: Not visualized. IMPRESSION: Negative for hydronephrosis. The kidneys appear normal. The urinary bladder is not visualized likely due to decompression. Electronically Signed   By: Inge Rise M.D.   On: 07/09/2018 14:09   Dg Chest Port 1 View  Result Date: 07/09/2018 CLINICAL DATA:  Chronic congestive heart failure. Coronary artery disease and stents. EXAM: PORTABLE CHEST 1 VIEW COMPARISON:  07/07/2018. FINDINGS: A poor inspiration is again demonstrated with a grossly normal sized heart. Interval minimal right basilar atelectasis. Otherwise, clear lungs. Coronary artery stent. Thoracic spine degenerative changes. IMPRESSION: Poor inspiration with minimal right basilar atelectasis. Electronically Signed   By: Claudie Revering M.D.   On: 07/09/2018 11:42    Cardiac Studies   Echocardiogram - Left ventricle: The cavity size was normal. Systolic function was   moderately reduced. The estimated ejection fraction was in the   range of 35% to 40%. Hypokinesis of the anteroseptal myocardium.   Hypokinesis of the apical myocardium. Hypokinesis of the anterior   myocardium. Doppler parameters are consistent with abnormal left   ventricular relaxation (grade 1 diastolic dysfunction). - Left atrium: The atrium was normal in size. - Right ventricle: Systolic function was normal. - Pulmonary arteries: Systolic pressure could not  be accurately estimated  Patient Profile     56-year gentleman with coronary artery disease, multiple stents, diabetes type 2, myasthenia gravis, hypertension hyperlipidemia presenting with colitis, sepsis, non-STEMI  Assessment & Plan    A/P: 1) non-STEMI Known severe coronary artery disease, Non-STEMI secondary to sepsis, hypotension, profound tachycardia on arrival Despite the above he has remained relatively asymptomatic with no significant shortness of breath or chest pain No dramatic EKG or echocardiogram changes noted On heparin infusion with aspirin Plavix All of his medications including Beta-blockers  entresto have been held for hypotension ---Currently not a good candidate for ischemic work-up given hypotension and sepsis Cardiac catheterization could potentially be considered prior to discharge For now would continue heparin infusion at least another 24 hours  2) sepsis, colitis, CT scan suggesting colitis On broad-spectrum antibiotics Given climbing creatinine, will start low rate IV fluids   3) chronic combined systolic and diastolic CHF Urine dark, possibly prerenal Blood pressure low Low rate IV fluids as above  4) acute on chronic renal failure Secondary to ATN, sepsis Worse in the past 48 hours Unable to exclude prerenal state Started on low rate IV fluids  Case discussed with nursing in detail  and with patient Will monitor blood pressure closely  Total encounter time more than 35 minutes  Greater than 50% was spent in counseling and coordination of care with the patient  For questions or updates, please contact [place] Please consult [email or web address] for contact info under     Signed, Ida Rogue, MD  07/09/2018, 4:04 PM

## 2018-07-09 NOTE — Plan of Care (Signed)
Patient alert and oriented x 4.  Able to make needs known.  Unable to urinate this morning in urinal.  Did not want I/O catherization this am.  Prefers to use the urinal at bedside. Patient would like to try later this morning. Continues to refuse Humalog. Given 30U Lantus once at bedtime per patient reqruest.  Continues to have Bms without difficulty.  Pain meds given x 2 for chronic low back pain. Will continue to monitor.

## 2018-07-09 NOTE — Progress Notes (Signed)
[facility] for heparin Indication: chest pain/ACS  Allergies  Allergen Reactions  . Metoprolol Rash  . Novolog [Insulin Aspart] Hives    Patient Measurements: Height: 5\' 7"  (170.2 cm) Weight: 235 lb 14.3 oz (107 kg) IBW/kg (Calculated) : 66.1 Heparin Dosing Weight: 92.1 kg  Vital Signs: Temp: 98.3 F (36.8 C) (10/05 1958) Temp Source: Oral (10/05 1958) BP: 101/82 (10/05 1958) Pulse Rate: 105 (10/05 1958)  Labs: Recent Labs    07/07/18 2005 07/08/18 0606 07/08/18 0938  07/08/18 1632 07/08/18 2244 07/09/18 0447 07/09/18 1321 07/09/18 2005  HGB 11.9* 11.6*  --   --   --   --  10.5*  --   --   HCT 34.4* 34.1*  --   --   --   --  31.7*  --   --   PLT 105* 122*  --   --   --   --  120*  --   --   LABPROT 15.7*  --   --   --   --   --   --   --   --   INR 1.26  --   --   --   --   --   --   --   --   HEPARINUNFRC  --   --   --    < > 0.27* 0.23* 0.22* 0.39 0.30  CREATININE 1.74* 1.98*  --   --   --   --  2.59*  --   --   TROPONINI  --   --  11.48*  --  9.32* 7.08*  --   --   --    < > = values in this interval not displayed.    Estimated Creatinine Clearance: 35.8 mL/min (A) (by C-G formula based on SCr of 2.59 mg/dL (H)).   Medical History: Past Medical History:  Diagnosis Date  . Allergy    dust, seasonal (worse in the fall).  . 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 Followed by Pain Specialist in [place], back and neck  . Asthma    BRONCHITIS  . Cataract    First Dx in 2012  . Chronic combined systolic and diastolic congestive heart failure ([place])    a. 03/2018 Echo: EF 30-35%, ant, antlat, apical AK, Gr1 DD.  [name] Coronary artery disease    a. Prior Ant MI->s/p multiple stents placed in the LAD and right coronary artery ([place]); b. 2016 Cath: reportedly nonobs dzs;  c. 02/2017 MV: EF 45-54%, ap/ant, ap/inf, apical infarct, no ishcemia; c. 04/2018 Cath/PCI: LM nl, LAD 20p, patent mid stent, LCX 53m(3.25x15 [place] DES), OM1 nl, OM2 50, OM3  40 w/ patent stent, RCA 40p, 86m, 40d w/ patent stent in RPDA, RPAV 60, EF 25-35%. 2+MR.  . Diabetes mellitus without complication ([place])    TYPE 2  . Diabetic peripheral neuropathy (H[facility])    feet and hands  . GERD (gastroesophageal reflux disease)   . Headache    muscle tension  . Hyperlipidemia   . Hypertension    CONTROLLED ON MEDS  . Insomnia   . Ischemic cardiomyopathy    a. 03/2018 Echo: EF 30-35%, ant, antlat, apical AK, Gr1 DD, mild MR, mildly dil LA.  [name] Lyme disease    Chronic  . Myasthenia gravis ([place])   . Myocardial infarction ([place]) 2010  . Seasonal allergies   . Sleep apnea    CPAP    Assessment: 57 year old male  with extensive cardiac PMH. H/o 11 stents. No anticoagulation PTA. Troponin 10/4 am elevated at 11.48. Pharmacy consulted for heparin drip.  Goal of Therapy:  Heparin level 0.3-0.7 units/ml Monitor platelets by anticoagulation protocol: Yes   Plan:  Per conversation with NP Blakeney, will not give boluses. Platelets low at 122.  10/4 1632 HL 0.27. Subtherapeutic. Will increase heparin drip to 1250 units/hr and recheck HL at 2300. CBC ordered with morning labs. Will follow platelets closely for any change.  10/04 PM heparin level 0.23. Increase rate to 1450 units/hr and recheck with AM labs.  10/05 AM heparin level 0.22. Increase rate to 1650 units/hr and recheck in 6 hours.  10/5: Heparin level @ 1321 results = 0.39. Will continue current heparin gtt rate and will recheck in 6 hours.   10/5: Heparin Level @ 2005 resulted 0.3 Will continue current heparin rate and recheck with morning labs  Forrest Moron, PharmD 07/09/2018 9:16 PM

## 2018-07-09 NOTE — Progress Notes (Signed)
Follow up - Critical Care Medicine Note  Patient Details:    [name] is an 57 y.o. male.presented to [facility] ED on 07/07/18 with c/o lethargy, confusion, and fever.Thepatient's sonsreport that their father was very lethargic on 10/3, with confusion and some slurred speech, prompting them to call EMS.Upon arrival to the ED, pt was noted to be febrile with Temperature 103, Tachycardic and Hypoxic with O2 sats 85% on room air. Initial workup in the ED revealed Lactic Acid 2.5, WBC 1.7, Creatinine 1.74. UA negative for UTI, and CXR negative for acute cardiopulmonary disease. CT Abdomen & Pelviswas obtained which was concerning for suspected colitis of the sigmoid colon  Lines, Airways, Drains:    Anti-infectives:  Anti-infectives (From admission, onward)   Start     Dose/Rate Route Frequency Ordered Stop   07/08/18 0800  ceFEPIme (MAXIPIME) 2 g in sodium chloride 0.9 % 100 mL IVPB     2 g 200 mL/hr over 30 Minutes Intravenous Every 12 hours 07/08/18 0008     07/08/18 0600  vancomycin (VANCOCIN) 1,250 mg in sodium chloride 0.9 % 250 mL IVPB  Status:  Discontinued     1,250 mg 166.7 mL/hr over 90 Minutes Intravenous Every 18 hours 07/07/18 2329 07/08/18 1037   07/08/18 0200  ceFEPIme (MAXIPIME) 2 g in sodium chloride 0.9 % 100 mL IVPB  Status:  Discontinued     2 g 200 mL/hr over 30 Minutes Intravenous Every 12 hours 07/07/18 2329 07/08/18 0008   07/07/18 2030  ceFEPIme (MAXIPIME) 2 g in sodium chloride 0.9 % 100 mL IVPB     2 g 200 mL/hr over 30 Minutes Intravenous  Once 07/07/18 2029 07/07/18 2121   07/07/18 2030  metroNIDAZOLE (FLAGYL) IVPB 500 mg     500 mg 100 mL/hr over 60 Minutes Intravenous Every 8 hours 07/07/18 2029     07/07/18 2030  vancomycin (VANCOCIN) IVPB 1000 mg/200 mL premix     1,000 mg 200 mL/hr over 60 Minutes Intravenous  Once 07/07/18 2029 07/07/18 2333      Microbiology: Results for orders placed or performed during the hospital encounter of 07/07/18   Culture, blood (Routine x 2)     Status: None (Preliminary result)   Collection Time: 07/07/18  8:05 PM  Result Value Ref Range Status   Specimen Description BLOOD RIGHT ANTECUBITAL  Final   Special Requests   Final    BOTTLES DRAWN AEROBIC AND ANAEROBIC Blood Culture results may not be optimal due to an excessive volume of blood received in culture bottles   Culture   Final    NO GROWTH 2 DAYS Performed at [facility], [address], [place], [place] [address]    Report Status PENDING  Incomplete  MRSA PCR Screening     Status: None   Collection Time: 07/08/18  5:44 AM  Result Value Ref Range Status   MRSA by PCR NEGATIVE NEGATIVE Final    Comment:        The GeneXpert MRSA Assay (FDA approved for NASAL specimens only), is one component of a comprehensive MRSA colonization surveillance program. It is not intended to diagnose MRSA infection nor to guide or monitor treatment for MRSA infections. Performed at [facility], [address] [place], [place] [address]   Culture, blood (Routine x 2)     Status: None (Preliminary result)   Collection Time: 07/08/18  6:06 AM  Result Value Ref Range Status   Specimen Description BLOOD BLOOD RIGHT HAND  Final   Special Requests   Final    BOTTLES DRAWN AEROBIC AND ANAEROBIC Blood Culture adequate volume   Culture   Final    NO GROWTH 1 DAY Performed at [facility], [address] [place], [place] [address]    Report Status PENDING  Incomplete     Studies: Dg Chest 1 View  Result Date: 07/07/2018 CLINICAL DATA:  Shortness of breath. Altered mental status. EXAM: CHEST  1 VIEW COMPARISON:  01/27/2018. FINDINGS: Poor inspiration. Normal sized heart. Coronary artery stent. Clear lungs with normal vascularity. Thoracic spine degenerative changes. IMPRESSION: No acute abnormality. Electronically Signed   By: Claudie Revering M.D.   On: 07/07/2018 20:48   Ct Head Wo Contrast  Result Date:  07/08/2018 CLINICAL DATA:  Slurred speech, lethargy and confusion today. EXAM: CT HEAD WITHOUT CONTRAST TECHNIQUE: Contiguous axial images were obtained from the base of the skull through the vertex without intravenous contrast. COMPARISON:  None. FINDINGS: Brain: No evidence of acute infarction, hemorrhage, hydrocephalus, extra-axial collection or mass lesion/mass effect. Vascular: No hyperdense vessel or unexpected calcification. Skull: Normal. Negative for fracture or focal lesion. Sinuses/Orbits: Negative. Other: None. IMPRESSION: Negative head CT. Electronically Signed   By: Inge Rise M.D.   On: 07/08/2018 12:42   Ct Abdomen Pelvis W Contrast  Result Date: 07/07/2018 CLINICAL DATA:  Vomiting, fever, and altered mental status. EXAM: CT ABDOMEN AND PELVIS WITH CONTRAST TECHNIQUE: Multidetector CT imaging of the abdomen and pelvis was performed using the standard protocol following bolus administration of intravenous contrast. CONTRAST:  51mL OMNIPAQUE IOHEXOL 300 MG/ML  SOLN COMPARISON:  None. FINDINGS: Lower chest: Breathing motion artifact. Subsegmental atelectasis in the right lower lobe. No confluent consolidation. No pleural fluid. There are coronary artery calcifications. Hepatobiliary: No focal hepatic abnormality. Mild gallbladder distention, motion artifact through the gallbladder limits assessment for wall thickening. No calcified gallstone. No biliary dilatation. Pancreas: Completely fatty replaced. No ductal dilatation or inflammation. Spleen: Normal in size without focal abnormality. Adrenals/Urinary Tract: Normal adrenal glands. No hydronephrosis. Bilateral symmetric perinephric edema. Delayed excretion on delayed phase imaging. Urinary bladder is nondistended. Equivocal bladder wall thickening versus nondistention. Stomach/Bowel: Stomach physiologically distended. No small bowel wall thickening, inflammatory change or wall thickening, slight motion artifact limitations. Cecum is  high-riding in the right mid abdomen. Normal appendix. Hepatic flexure through descending colon are nondistended, limiting assessment. Short segment proximal sigmoid colonic wall thickening with questionable pericolonic edema, image 71 series 2, partially obscured by motion. Moderate stool in the more distal sigmoid colon, stool distends the rectum which spans 7 cm. Vascular/Lymphatic: Moderate aortic and branch atherosclerosis. No enlarged lymph nodes in the abdomen or pelvis. Reproductive: Central prostatic calcifications. Other: Small amount of free fluid in the pelvis, likely reactive. No free air or intra-abdominal abscess. Scattered soft tissue densities in the anterior abdominal wall most consistent with medication injection sites. Musculoskeletal: There are no acute or suspicious osseous abnormalities. IMPRESSION: 1. Short segment sigmoid colonic wall thickening with questionable pericolonic edema, suspicious for colitis. Motion artifact limits detailed assessment. Consider follow-up colonoscopy to exclude underlying neoplasm after resolution of acute event. Hepatic flexure through the distal descending colon are nondistended, limiting assessment for additional subtle colonic wall thickening. 2. Moderate colonic stool in the sigmoid colon, stool distends the rectum. 3. Bilateral perinephric edema, often chronic and related to underlying renal dysfunction, recommend correlation with urinalysis to exclude urinary tract infection. 4. Complete fatty replacement of the pancreas, likely secondary to history of diabetes. 5. Aortic Atherosclerosis (ICD10-I70.0).  Coronary artery calcifications. Electronically Signed   By: Keith Rake M.D.   On: 07/07/2018 23:32    Consults: Treatment Team:  [name], MD   Subjective:    Overnight Issues: She did well yesterday, mental status significantly improved, head scan was negative for any acute intracranial abnormality, blood pressure remained stable.   It was noted to have an elevated troponin and is being followed by cardiology  Objective:  Vital signs for last 24 hours: Temp:  [97.1 F (36.2 C)-98.1 F (36.7 C)] 97.7 F (36.5 C) (10/05 0734) Pulse Rate:  [99-119] 104 (10/05 1040) Resp:  [10-23] 11 (10/05 1040) BP: (73-139)/(48-126) 96/65 (10/05 1040) SpO2:  [96 %-100 %] 96 % (10/05 0400)  Hemodynamic parameters for last 24 hours:    Intake/Output from previous day: 10/04 0701 - 10/05 0700 In: 60 [P.O.:60] Out: 400 [Urine:400]  Intake/Output this shift: Total I/O In: 480 [P.O.:480] Out: -   Vent settings for last 24 hours:    Physical Exam:  She is awake, alert, in no acute distress Cardiovascular: Regular rate and rhythm Pulmonary: Clear to auscultation Abdominal: Positive bowel sounds, soft nontender exam Extremities: No clubbing cyanosis or edema noted Neurologic: No focal deficits noted  Assessment/Plan:   Mental status.  Patient's mental status has significantly improved, encephalopathy resolving, CT scan of the head was negative  Sepsis and cultures negative so far, CT scan of the abdomen was consistent with colitis.  Patient has done well with cefepime and Flagyl along with fluid resuscitation  NSTEMI.  Patient does have a significant cardiac history, is being followed by cardiology, will most likely need catheterization.  Peak troponin was 11.48, troponin this morning was 7.08  Renal failure.  BUN is 65/creatinine 2.59.  This is worse than his initial values of 39/1.98.  Hopefully this will plateau and most likely represents ATN secondary to sepsis no evidence of hydronephrosis on CT scan however will obtain renal ultrasound, bladder scan, avoid nephrotoxic agents.  This point patient is doing well, stable for floor transfer with cardiac monitoring  [name] 07/09/2018  *Care during the described time interval was provided by me and/or other providers on the critical care team.  I have reviewed this  patient's available data, including medical history, events of note, physical examination and test results as part of my evaluation.

## 2018-07-09 NOTE — Progress Notes (Signed)
Pt refused cpap

## 2018-07-10 ENCOUNTER — Encounter: Payer: Self-pay | Admitting: Family Medicine

## 2018-07-10 LAB — URINE CULTURE: CULTURE: NO GROWTH

## 2018-07-10 LAB — COMPREHENSIVE METABOLIC PANEL
ALBUMIN: 2.5 g/dL — AB (ref 3.5–5.0)
ALK PHOS: 49 U/L (ref 38–126)
ALT: 25 U/L (ref 0–44)
AST: 32 U/L (ref 15–41)
Anion gap: 6 (ref 5–15)
BILIRUBIN TOTAL: 0.7 mg/dL (ref 0.3–1.2)
BUN: 58 mg/dL — AB (ref 6–20)
CALCIUM: 7.2 mg/dL — AB (ref 8.9–10.3)
CO2: 23 mmol/L (ref 22–32)
Chloride: 111 mmol/L (ref 98–111)
Creatinine, Ser: 1.61 mg/dL — ABNORMAL HIGH (ref 0.61–1.24)
GFR calc Af Amer: 52 mL/min — ABNORMAL LOW (ref >60)
GFR calc non Af Amer: 45 mL/min — ABNORMAL LOW (ref >60)
GLUCOSE: 144 mg/dL — AB (ref 70–99)
POTASSIUM: 3.9 mmol/L (ref 3.5–5.1)
SODIUM: 140 mmol/L (ref 135–145)
Total Protein: 5.3 g/dL — ABNORMAL LOW (ref 6.5–8.1)

## 2018-07-10 LAB — GLUCOSE, CAPILLARY
GLUCOSE-CAPILLARY: 142 mg/dL — AB (ref 70–99)
GLUCOSE-CAPILLARY: 142 mg/dL — AB (ref 70–99)
GLUCOSE-CAPILLARY: 261 mg/dL — AB (ref 70–99)
Glucose-Capillary: 219 mg/dL — ABNORMAL HIGH (ref 70–99)
Glucose-Capillary: 235 mg/dL — ABNORMAL HIGH (ref 70–99)
Glucose-Capillary: 270 mg/dL — ABNORMAL HIGH (ref 70–99)

## 2018-07-10 LAB — PROCALCITONIN: Procalcitonin: 52.29 ng/mL

## 2018-07-10 LAB — HEPARIN LEVEL (UNFRACTIONATED): Heparin Unfractionated: 0.25 IU/mL — ABNORMAL LOW (ref 0.30–0.70)

## 2018-07-10 MED ORDER — SODIUM CHLORIDE 0.9 % IV SOLN
INTRAVENOUS | Status: AC
  Administered 2018-07-10 – 2018-07-11 (×2): via INTRAVENOUS

## 2018-07-10 MED ORDER — SODIUM CHLORIDE 0.9 % IV SOLN
INTRAVENOUS | Status: DC | PRN
  Administered 2018-07-10 – 2018-07-13 (×5): 500 mL via INTRAVENOUS

## 2018-07-10 MED ORDER — ALUM & MAG HYDROXIDE-SIMETH 200-200-20 MG/5ML PO SUSP
30.0000 mL | ORAL | Status: DC | PRN
  Administered 2018-07-10 – 2018-07-13 (×4): 30 mL via ORAL
  Filled 2018-07-10 (×4): qty 30

## 2018-07-10 NOTE — Progress Notes (Signed)
[place] for heparin Indication: chest pain/ACS  Allergies  Allergen Reactions  . Metoprolol Rash  . Novolog [Insulin Aspart] Hives    Patient Measurements: Height: 5\' 7"  (170.2 cm) Weight: 235 lb 14.3 oz (107 kg) IBW/kg (Calculated) : 66.1 Heparin Dosing Weight: 92.1 kg  Vital Signs: Temp: 98.3 F (36.8 C) (10/05 1958) Temp Source: Oral (10/05 1958) BP: 101/82 (10/05 1958) Pulse Rate: 105 (10/05 1958)  Labs: Recent Labs    07/07/18 2005 07/08/18 0606 07/08/18 3825  07/08/18 1632 07/08/18 2244 07/09/18 0447 07/09/18 1321 07/09/18 2005 07/10/18 0425  HGB 11.9* 11.6*  --   --   --   --  10.5*  --   --   --   HCT 34.4* 34.1*  --   --   --   --  31.7*  --   --   --   PLT 105* 122*  --   --   --   --  120*  --   --   --   LABPROT 15.7*  --   --   --   --   --   --   --   --   --   INR 1.26  --   --   --   --   --   --   --   --   --   HEPARINUNFRC  --   --   --    < > 0.27* 0.23* 0.22* 0.39 0.30 0.25*  CREATININE 1.74* 1.98*  --   --   --   --  2.59*  --   --  1.61*  TROPONINI  --   --  11.48*  --  9.32* 7.08*  --   --   --   --    < > = values in this interval not displayed.    Estimated Creatinine Clearance: 57.6 mL/min (A) (by C-G formula based on SCr of 1.61 mg/dL (H)).   Medical History: Past Medical History:  Diagnosis Date  . Allergy    dust, seasonal (worse in the fall).  . 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 Followed by Pain Specialist in [place], back and neck  . Asthma    BRONCHITIS  . Cataract    First Dx in 2012  . Chronic combined systolic and diastolic congestive heart failure ([place])    a. 03/2018 Echo: EF 30-35%, ant, antlat, apical AK, Gr1 DD.  [name] Coronary artery disease    a. Prior Ant MI->s/p multiple stents placed in the LAD and right coronary artery ([place]); b. 2016 Cath: reportedly nonobs dzs;  c. 02/2017 MV: EF 45-54%, ap/ant, ap/inf, apical infarct, no ishcemia; c. 04/2018 Cath/PCI: LM nl, LAD 20p, patent mid  stent, LCX 75m(3.25x15 [place] DES), OM1 nl, OM2 50, OM3 40 w/ patent stent, RCA 40p, 26m, 40d w/ patent stent in RPDA, RPAV 60, EF 25-35%. 2+MR.  . Diabetes mellitus without complication ([place])    TYPE 2  . Diabetic peripheral neuropathy (H[facility])    feet and hands  . GERD (gastroesophageal reflux disease)   . Headache    muscle tension  . Hyperlipidemia   . Hypertension    CONTROLLED ON MEDS  . Insomnia   . Ischemic cardiomyopathy    a. 03/2018 Echo: EF 30-35%, ant, antlat, apical AK, Gr1 DD, mild MR, mildly dil LA.  [name] Lyme disease    Chronic  . Myasthenia gravis ([place])   . Myocardial infarction ([place])  2010  . Seasonal allergies   . Sleep apnea    CPAP    Assessment: 57 year old male with extensive cardiac PMH. H/o 11 stents. No anticoagulation PTA. Troponin 10/4 am elevated at 11.48. Pharmacy consulted for heparin drip.  Goal of Therapy:  Heparin level 0.3-0.7 units/ml Monitor platelets by anticoagulation protocol: Yes   Plan:  Per conversation with NP Blakeney, will not give boluses. Platelets low at 122.  10/4 1632 HL 0.27. Subtherapeutic. Will increase heparin drip to 1250 units/hr and recheck HL at 2300. CBC ordered with morning labs. Will follow platelets closely for any change.  10/04 PM heparin level 0.23. Increase rate to 1450 units/hr and recheck with AM labs.  10/05 AM heparin level 0.22. Increase rate to 1650 units/hr and recheck in 6 hours.  10/5: Heparin level @ 1321 results = 0.39. Will continue current heparin gtt rate and will recheck in 6 hours.   10/5: Heparin Level @ 2005 resulted 0.3 Will continue current heparin rate and recheck with morning labs  10/6 AM heparin level 0/26. Increase rate to 1850 units/hr. Recheck in 6 hours   Mihika Surrette S, PharmD 07/10/2018 6:41 AM

## 2018-07-10 NOTE — Progress Notes (Signed)
CCMD reports a run of SVT. MD Marcille Blanco made aware. No new orders. Pt asymptomatic, no distress noted.

## 2018-07-10 NOTE — Progress Notes (Signed)
718   Progress Note  Patient Name: [name] Date of Encounter: 07/10/2018  Primary Cardiologist: [name], MD   Subjective   Reports eating liquid diet this morning for breakfast, no significant abdominal pain Denies any chest pain or shortness of breath Feels weak,  Previously participating in cardiac rehab, limitations secondary to myasthenia gravis No bowel movements Continues on IV fluids 75 cc/h with improvement of renal function Sipping on soda, other drinks  Blood pressure continues to run low 97 systolic, denies orthostasis symptoms  Inpatient Medications    Scheduled Meds: . aspirin EC  81 mg Oral Daily  . clopidogrel  75 mg Oral Q breakfast  . fluticasone furoate-vilanterol  1 puff Inhalation Daily  . insulin glargine  10 Units Subcutaneous QHS  . insulin lispro  0-15 Units Subcutaneous Q4H  . montelukast  10 mg Oral QHS  . pantoprazole  40 mg Oral Daily  . pyridostigmine  60 mg Oral QID  . rosuvastatin  5 mg Oral Daily   Continuous Infusions: . ceFEPime (MAXIPIME) IV 2 g (07/10/18 0914)  . metronidazole 500 mg (07/10/18 1355)   PRN Meds: acetaminophen **OR** acetaminophen, alum & mag hydroxide-simeth, ondansetron **OR** ondansetron (ZOFRAN) IV, oxyCODONE   Vital Signs    Vitals:   07/09/18 1630 07/09/18 1958 07/10/18 0648 07/10/18 0736  BP: (!) 96/59 101/82 96/65 98/69   Pulse: 88 (!) 105 99 98  Resp:  16 16 18   Temp: 98.1 F (36.7 C) 98.3 F (36.8 C) 97.9 F (36.6 C) 97.8 F (36.6 C)  TempSrc: Oral Oral Oral Oral  SpO2: 98% 92% 95% 99%  Weight:      Height:        Intake/Output Summary (Last 24 hours) at 07/10/2018 1456 Last data filed at 07/10/2018 1355 Gross per 24 hour  Intake 1408.86 ml  Output 1875 ml  Net -466.14 ml   Filed Weights   07/07/18 2006 07/09/18 1300  Weight: 107.5 kg 107 kg    Telemetry    Sinus tachycardia- Personally Reviewed  ECG      Physical Exam   Constitutional:  oriented to person, place,  and time.  Supine in bed, no distress HENT:  Head: Normocephalic and atraumatic.  Eyes:  no discharge. No scleral icterus.  Neck: Normal range of motion. Neck supple. No JVD present.  Cardiovascular: Tachycardic, regular rhythm,  normal heart sounds and intact distal pulses. Exam reveals no gallop and no friction rub. No edema No murmur heard. Pulmonary/Chest: Poor inspiratory effort, otherwise normal breath sounds,  No stridor. No respiratory distress.  no wheezes.  no rales.  no tenderness.  Abdominal: Soft.  no distension.  no tenderness.  Musculoskeletal: Exam not performed Neurological: Grossly normal, limited exam Skin: Skin is warm and dry. No rash noted. not diaphoretic.  Psychiatric:  normal mood and affect. behavior is normal. Thought content normal.    Labs    Chemistry Recent Labs  Lab 07/07/18 2005 07/08/18 0606 07/09/18 0447 07/10/18 0425  NA 138 142 139 140  K 4.0 4.2 4.7 3.9  CL 104 108 108 111  CO2 25 20* 21* 23  GLUCOSE 275* 248* 282* 144*  BUN 36* 39* 65* 58*  CREATININE 1.74* 1.98* 2.59* 1.61*  CALCIUM 8.7* 8.6* 7.5* 7.2*  PROT 6.1*  --   --  5.3*  ALBUMIN 3.4*  --   --  2.5*  AST 40  --   --  32  ALT 30  --   --  25  ALKPHOS 86  --   --  49  BILITOT 1.4*  --   --  0.7  GFRNONAA 41* 35* 25* 45*  GFRAA 48* 41* 30* 52*  ANIONGAP 9 14 10 6      Hematology Recent Labs  Lab 07/07/18 2005 07/08/18 0606 07/09/18 0447  WBC 1.7* 5.7 12.2*  RBC 3.98* 3.81* 3.63*  HGB 11.9* 11.6* 10.5*  HCT 34.4* 34.1* 31.7*  MCV 86.5 89.5 87.3  MCH 30.0 30.4 29.1  MCHC 34.7 34.0 33.3  RDW 14.7* 15.3* 15.9*  PLT 105* 122* 120*    Cardiac Enzymes Recent Labs  Lab 07/08/18 0812 07/08/18 1632 07/08/18 2244  TROPONINI 11.48* 9.32* 7.08*   No results for input(s): TROPIPOC in the last 168 hours.   BNPNo results for input(s): BNP, PROBNP in the last 168 hours.   DDimer No results for input(s): DDIMER in the last 168 hours.   Radiology    US Renal  Result  Date: 07/09/2018 CLINICAL DATA:  Acute renal failure. EXAM: RENAL / URINARY TRACT ULTRASOUND COMPLETE COMPARISON:  CT abdomen and pelvis 07/07/2018. FINDINGS: Right Kidney: Length: 11.2 cm. Echogenicity within normal limits. No mass or hydronephrosis visualized. Left Kidney: Length: 12.1 cm. Echogenicity within normal limits. No mass or hydronephrosis visualized. Bladder: Not visualized. IMPRESSION: Negative for hydronephrosis. The kidneys appear normal. The urinary bladder is not visualized likely due to decompression. Electronically Signed   By: Inge Rise M.D.   On: 07/09/2018 14:09   Dg Chest Port 1 View  Result Date: 07/09/2018 CLINICAL DATA:  Chronic congestive heart failure. Coronary artery disease and stents. EXAM: PORTABLE CHEST 1 VIEW COMPARISON:  07/07/2018. FINDINGS: A poor inspiration is again demonstrated with a grossly normal sized heart. Interval minimal right basilar atelectasis. Otherwise, clear lungs. Coronary artery stent. Thoracic spine degenerative changes. IMPRESSION: Poor inspiration with minimal right basilar atelectasis. Electronically Signed   By: Claudie Revering M.D.   On: 07/09/2018 11:42    Cardiac Studies   Echocardiogram - Left ventricle: The cavity size was normal. Systolic function was   moderately reduced. The estimated ejection fraction was in the   range of 35% to 40%. Hypokinesis of the anteroseptal myocardium.   Hypokinesis of the apical myocardium. Hypokinesis of the anterior   myocardium. Doppler parameters are consistent with abnormal left   ventricular relaxation (grade 1 diastolic dysfunction). - Left atrium: The atrium was normal in size. - Right ventricle: Systolic function was normal. - Pulmonary arteries: Systolic pressure could not be accurately estimated  Patient Profile     5-year gentleman with coronary artery disease, multiple stents, diabetes type 2, myasthenia gravis, hypertension hyperlipidemia presenting with colitis, sepsis,  non-STEMI  Assessment & Plan    A/P: 1) non-STEMI Known severe coronary artery disease, Non-STEMI secondary to sepsis, hypotension, profound tachycardia on arrival No dramatic EKG or echocardiogram changes noted All of his medications including Beta-blockers  entresto have been held for hypotension --- Recommend we hold the heparin infusion Continue aspirin Plavix ---Currently not a good candidate for ischemic work-up given hypotension, resolving sepsis, renal dysfunction Cardiac catheterization could potentially be considered prior to discharge if renal function improves to adequate level and if patient wishes  2) sepsis, colitis, CT scan suggesting colitis On broad-spectrum antibiotics Blood pressure still running low but slowly improving IV fluids held given improved renal function, and concern for CHF  3) chronic combined systolic and diastolic CHF Currently taking oral fluids If unable to take adequate oral intake, may need to  IV fluids at low rate -He has completed 24 hours of 75 cc/h with normal saline, held this morning  4) acute on chronic renal failure Secondary to ATN, sepsis, prerenal state Creatinine 1.6    Total encounter time more than 25 minutes  Greater than 50% was spent in counseling and coordination of care with the patient  For questions or updates, please contact [facility] Please consult [email or web address] for contact info under     Signed, Ida Rogue, MD  07/10/2018, 2:56 PM him in the hands of these time notably to 9

## 2018-07-10 NOTE — Progress Notes (Signed)
[place] [facility] [place] NAME: [name]    MR#:  [id number]  DATE OF BIRTH:  July 18, 1961  SUBJECTIVE:  patient came in with fever altered mental status and was found to have acute colitis.  He is feeling a lot better.  Tolerating liquid diet, low blood pressure is improving No chest pain or shortness of breath. Transferred from ICU to telemetry 07/09/2018  REVIEW OF SYSTEMS:   Review of Systems  Constitutional: Negative for chills, fever and weight loss.  HENT: Negative for ear discharge, ear pain and nosebleeds.   Eyes: Negative for blurred vision, pain and discharge.  Respiratory: Negative for sputum production, shortness of breath, wheezing and stridor.   Cardiovascular: Negative for chest pain, palpitations, orthopnea and PND.  Gastrointestinal: Negative for abdominal pain, diarrhea, nausea and vomiting.  Genitourinary: Negative for frequency and urgency.  Musculoskeletal: Negative for back pain and joint pain.  Neurological: Negative for sensory change, speech change, focal weakness and weakness.  Psychiatric/Behavioral: Negative for depression and hallucinations. The patient is not nervous/anxious.    Tolerating Diet:clear Tolerating PT:   DRUG ALLERGIES:   Allergies  Allergen Reactions  . Metoprolol Rash  . Novolog [Insulin Aspart] Hives    VITALS:  Blood pressure 119/82, pulse 98, temperature 97.8 F (36.6 C), temperature source Oral, resp. rate 18, height 5\' 7"  (1.702 m), weight 107 kg, SpO2 97 %.  PHYSICAL EXAMINATION:   Physical Exam  GENERAL:  57 y.o.-year-old patient lying in the bed with no acute distress.  EYES: Pupils equal, round, reactive to light and accommodation. No scleral icterus. Extraocular muscles intact.  HEENT: Head atraumatic, normocephalic. Oropharynx and nasopharynx clear.  NECK:  Supple, no jugular venous distention. No thyroid enlargement, no tenderness.  LUNGS: Normal breath sounds  bilaterally, no wheezing, rales, rhonchi. No use of accessory muscles of respiration.  CARDIOVASCULAR: S1, S2 normal. No murmurs, rubs, or gallops.  ABDOMEN: Soft, nontender, nondistended. Bowel sounds present. No organomegaly or mass.  EXTREMITIES: No cyanosis, clubbing or edema b/l.    NEUROLOGIC: Cranial nerves II through XII are intact. No focal Motor or sensory deficits b/l.   PSYCHIATRIC:  patient is alert and oriented x 3.  SKIN: No obvious rash, lesion, or ulcer.   LABORATORY PANEL:  CBC Recent Labs  Lab 07/09/18 0447  WBC 12.2*  HGB 10.5*  HCT 31.7*  PLT 120*    Chemistries  Recent Labs  Lab 07/10/18 0425  NA 140  K 3.9  CL 111  CO2 23  GLUCOSE 144*  BUN 58*  CREATININE 1.61*  CALCIUM 7.2*  AST 32  ALT 25  ALKPHOS 49  BILITOT 0.7   Cardiac Enzymes Recent Labs  Lab 07/08/18 2244  TROPONINI 7.08*   RADIOLOGY:  US Renal  Result Date: 07/09/2018 CLINICAL DATA:  Acute renal failure. EXAM: RENAL / URINARY TRACT ULTRASOUND COMPLETE COMPARISON:  CT abdomen and pelvis 07/07/2018. FINDINGS: Right Kidney: Length: 11.2 cm. Echogenicity within normal limits. No mass or hydronephrosis visualized. Left Kidney: Length: 12.1 cm. Echogenicity within normal limits. No mass or hydronephrosis visualized. Bladder: Not visualized. IMPRESSION: Negative for hydronephrosis. The kidneys appear normal. The urinary bladder is not visualized likely due to decompression. Electronically Signed   By: Inge Rise M.D.   On: 07/09/2018 14:09   Dg Chest Port 1 View  Result Date: 07/09/2018 CLINICAL DATA:  Chronic congestive heart failure. Coronary artery disease and stents. EXAM: PORTABLE CHEST 1 VIEW COMPARISON:  07/07/2018. FINDINGS: A poor  inspiration is again demonstrated with a grossly normal sized heart. Interval minimal right basilar atelectasis. Otherwise, clear lungs. Coronary artery stent. Thoracic spine degenerative changes. IMPRESSION: Poor inspiration with minimal right  basilar atelectasis. Electronically Signed   By: Claudie Revering M.D.   On: 07/09/2018 11:42   ASSESSMENT AND PLAN:   [name]  is a 57 y.o. male who presents with chief complaint as above.  Patient was brought to the ED by his son who states that he became more lethargic today after being out and about.  He had some slurred speech and was confused. Here in the ED he was febrile, tachycardic, with leukocytosis.  *Acute encephalopathy metabolic from underlying sepsis from acute colitis Almost back to his baseline CT head negative  *Severe sepsis ([place]) suspected due to severe colitis -broad-spectrum-IV antibiotics cefepime and Flagyl lactic acid was mildly elevated.  Patient has a history of heart failure, and so we will administer IV fluids cautiously and trend lactic until within normal limits.  Blood pressure has been better Procalcitonin 124.10-52.6  urine culture negative MRSA PCR negative Blood culture no growth so far  *Acute  Colitis -IV antibiotics cefepime and flagyl, other treatment as above  *  Acute renal failure superimposed on chronic kidney disease (HCC)-ATN -Creatinine 2.59-1.61 -IV fluids as above, avoid nephrotoxins and monitor Holding Entresto  *Non-STEMI acute- in the setting of history of   Coronary artery disease -continue home meds -seen by cardiology. Follow the recommendation from cardiology -f/u echo -cont medical management -troponin 11.48-7.08 -IV heparin gtt discontinued by cardiologist today -plavix,coreg, crestor, aspirin -Holding entresto -Inpatient cardiac catheterization potentially be considered prior to the discharge renal function improves and if patient wishes  *  DM (diabetes mellitus), type 2, uncontrolled with complications ([place]) -findings scale insulin with corresponding glucose checks -Lantus and ssi  *  Chronic combined systolic and diastolic CHF (congestive heart failure) ([place]) -cautious IV fluid administration as above, continue  home meds  *  GERD (gastroesophageal reflux disease) -Home dose PPI    *Hyperlipidemia -Home dose antilipid    Case discussed with Care Management/Social Worker. Management plans discussed with the patient, family and they are in agreement.  CODE STATUS: full  DVT Prophylaxis: heparin gtt  TOTAL TIME TAKING CARE OF THIS PATIENT: 35 minutes.  >50% time spent on counselling and coordination of care  POSSIBLE D/C IN *fewDAYS, DEPENDING ON CLINICAL CONDITION.  Note: This dictation was prepared with Dragon dictation along with smaller phrase technology. Any transcriptional errors that result from this process are unintentional.  Nicholes Mango M.D on 07/10/2018 at 9:34 PM  Between 7am to 6pm - Pager - [phone]  After 6pm go to [email or web address] - password [email or web address] [facility]  Office  [phone]  CC: Primary care physician; [name], MDPatient ID: [name], male   DOB: 06/08/1961, 57 y.o.   MRN: [id number]

## 2018-07-11 LAB — GLUCOSE, CAPILLARY
GLUCOSE-CAPILLARY: 162 mg/dL — AB (ref 70–99)
GLUCOSE-CAPILLARY: 310 mg/dL — AB (ref 70–99)
Glucose-Capillary: 188 mg/dL — ABNORMAL HIGH (ref 70–99)
Glucose-Capillary: 204 mg/dL — ABNORMAL HIGH (ref 70–99)
Glucose-Capillary: 232 mg/dL — ABNORMAL HIGH (ref 70–99)
Glucose-Capillary: 247 mg/dL — ABNORMAL HIGH (ref 70–99)
Glucose-Capillary: 324 mg/dL — ABNORMAL HIGH (ref 70–99)

## 2018-07-11 LAB — COMPREHENSIVE METABOLIC PANEL
ALT: 24 U/L (ref 0–44)
ANION GAP: 5 (ref 5–15)
AST: 27 U/L (ref 15–41)
Albumin: 2.4 g/dL — ABNORMAL LOW (ref 3.5–5.0)
Alkaline Phosphatase: 54 U/L (ref 38–126)
BUN: 31 mg/dL — ABNORMAL HIGH (ref 6–20)
CHLORIDE: 112 mmol/L — AB (ref 98–111)
CO2: 23 mmol/L (ref 22–32)
Calcium: 7.3 mg/dL — ABNORMAL LOW (ref 8.9–10.3)
Creatinine, Ser: 1.11 mg/dL (ref 0.61–1.24)
Glucose, Bld: 190 mg/dL — ABNORMAL HIGH (ref 70–99)
POTASSIUM: 4.2 mmol/L (ref 3.5–5.1)
Sodium: 140 mmol/L (ref 135–145)
Total Bilirubin: 0.8 mg/dL (ref 0.3–1.2)
Total Protein: 4.9 g/dL — ABNORMAL LOW (ref 6.5–8.1)

## 2018-07-11 LAB — CBC
HCT: 30.4 % — ABNORMAL LOW (ref 40.0–52.0)
Hemoglobin: 10.3 g/dL — ABNORMAL LOW (ref 13.0–18.0)
MCH: 29.3 pg (ref 26.0–34.0)
MCHC: 33.8 g/dL (ref 32.0–36.0)
MCV: 86.7 fL (ref 80.0–100.0)
PLATELETS: 113 10*3/uL — AB (ref 150–440)
RBC: 3.51 MIL/uL — ABNORMAL LOW (ref 4.40–5.90)
RDW: 15.5 % — AB (ref 11.5–14.5)
WBC: 6.8 10*3/uL (ref 3.8–10.6)

## 2018-07-11 LAB — PROCALCITONIN: Procalcitonin: 17.14 ng/mL

## 2018-07-11 MED ORDER — INSULIN ASPART 100 UNIT/ML ~~LOC~~ SOLN: 0.0000 [IU] | Freq: Three times a day (TID) | SUBCUTANEOUS | Status: DC

## 2018-07-11 MED ORDER — CARVEDILOL 3.125 MG PO TABS
3.1250 mg | ORAL_TABLET | Freq: Two times a day (BID) | ORAL | Status: DC
  Administered 2018-07-12 – 2018-07-13 (×3): 3.125 mg via ORAL
  Filled 2018-07-11 (×3): qty 1

## 2018-07-11 MED ORDER — INSULIN LISPRO 100 UNIT/ML ~~LOC~~ SOLN
0.0000 [IU] | Freq: Three times a day (TID) | SUBCUTANEOUS | Status: DC
  Administered 2018-07-12: 8 [IU] via SUBCUTANEOUS
  Administered 2018-07-12 (×2): 5 [IU] via SUBCUTANEOUS
  Administered 2018-07-12: 15 [IU] via SUBCUTANEOUS
  Administered 2018-07-13: 11 [IU] via SUBCUTANEOUS
  Administered 2018-07-13: 5 [IU] via SUBCUTANEOUS

## 2018-07-11 MED ORDER — SODIUM CHLORIDE 0.9 % IV SOLN
2.0000 g | Freq: Three times a day (TID) | INTRAVENOUS | Status: DC
  Administered 2018-07-11 – 2018-07-13 (×5): 2 g via INTRAVENOUS
  Filled 2018-07-11 (×7): qty 2

## 2018-07-11 MED ORDER — INSULIN GLARGINE 100 UNIT/ML ~~LOC~~ SOLN
20.0000 [IU] | Freq: Every day | SUBCUTANEOUS | Status: DC
  Administered 2018-07-11 – 2018-07-12 (×2): 20 [IU] via SUBCUTANEOUS
  Filled 2018-07-11 (×3): qty 0.2

## 2018-07-11 MED ORDER — FAMOTIDINE IN NACL 20-0.9 MG/50ML-% IV SOLN
20.0000 mg | Freq: Two times a day (BID) | INTRAVENOUS | Status: DC
  Administered 2018-07-11 – 2018-07-12 (×4): 20 mg via INTRAVENOUS
  Filled 2018-07-11 (×4): qty 50

## 2018-07-11 MED ORDER — ENOXAPARIN SODIUM 40 MG/0.4ML ~~LOC~~ SOLN
40.0000 mg | SUBCUTANEOUS | Status: DC
  Administered 2018-07-11 – 2018-07-12 (×2): 40 mg via SUBCUTANEOUS
  Filled 2018-07-11 (×2): qty 0.4

## 2018-07-11 NOTE — Progress Notes (Signed)
[place] at [place] NAME: [name]    MR#:  [id number]  DATE OF BIRTH:  Feb 09, 1961  SUBJECTIVE:  patient came in with fever altered mental status and was found to have acute colitis.  Nauseous, weak Transferred from ICU to telemetry 07/09/2018  REVIEW OF SYSTEMS:   Review of Systems  Constitutional: Negative for chills, fever and weight loss.  HENT: Negative for ear discharge, ear pain and nosebleeds.   Eyes: Negative for blurred vision, pain and discharge.  Respiratory: Negative for sputum production, shortness of breath, wheezing and stridor.   Cardiovascular: Negative for chest pain, palpitations, orthopnea and PND.  Gastrointestinal: Negative for abdominal pain, diarrhea, nausea and vomiting.  Genitourinary: Negative for frequency and urgency.  Musculoskeletal: Negative for back pain and joint pain.  Neurological: Negative for sensory change, speech change, focal weakness and weakness.  Psychiatric/Behavioral: Negative for depression and hallucinations. The patient is not nervous/anxious.    Tolerating Diet:clear Tolerating PT:   DRUG ALLERGIES:   Allergies  Allergen Reactions  . Metoprolol Rash  . Novolog [Insulin Aspart] Hives    VITALS:  Blood pressure 122/77, pulse (!) 101, temperature 98.2 F (36.8 C), temperature source Oral, resp. rate 19, height 5\' 7"  (1.702 m), weight 107 kg, SpO2 95 %.  PHYSICAL EXAMINATION:   Physical Exam  GENERAL:  57 y.o.-year-old patient lying in the bed with no acute distress.  EYES: Pupils equal, round, reactive to light and accommodation. No scleral icterus. Extraocular muscles intact.  HEENT: Head atraumatic, normocephalic. Oropharynx and nasopharynx clear.  NECK:  Supple, no jugular venous distention. No thyroid enlargement, no tenderness.  LUNGS: Normal breath sounds bilaterally, no wheezing, rales, rhonchi. No use of accessory muscles of respiration.  CARDIOVASCULAR: S1,  S2 normal. No murmurs, rubs, or gallops.  ABDOMEN: Soft, nontender, nondistended. Bowel sounds present. No organomegaly or mass.  EXTREMITIES: No cyanosis, clubbing or edema b/l.    NEUROLOGIC: Cranial nerves II through XII are intact. No focal Motor or sensory deficits b/l.   PSYCHIATRIC:  patient is alert and oriented x 3.  SKIN: No obvious rash, lesion, or ulcer.   LABORATORY PANEL:  CBC Recent Labs  Lab 07/11/18 0458  WBC 6.8  HGB 10.3*  HCT 30.4*  PLT 113*    Chemistries  Recent Labs  Lab 07/11/18 0458  NA 140  K 4.2  CL 112*  CO2 23  GLUCOSE 190*  BUN 31*  CREATININE 1.11  CALCIUM 7.3*  AST 27  ALT 24  ALKPHOS 54  BILITOT 0.8   Cardiac Enzymes Recent Labs  Lab 07/08/18 2244  TROPONINI 7.08*   RADIOLOGY:  US Renal  Result Date: 07/09/2018 CLINICAL DATA:  Acute renal failure. EXAM: RENAL / URINARY TRACT ULTRASOUND COMPLETE COMPARISON:  CT abdomen and pelvis 07/07/2018. FINDINGS: Right Kidney: Length: 11.2 cm. Echogenicity within normal limits. No mass or hydronephrosis visualized. Left Kidney: Length: 12.1 cm. Echogenicity within normal limits. No mass or hydronephrosis visualized. Bladder: Not visualized. IMPRESSION: Negative for hydronephrosis. The kidneys appear normal. The urinary bladder is not visualized likely due to decompression. Electronically Signed   By: Inge Rise M.D.   On: 07/09/2018 14:09   Dg Chest Port 1 View  Result Date: 07/09/2018 CLINICAL DATA:  Chronic congestive heart failure. Coronary artery disease and stents. EXAM: PORTABLE CHEST 1 VIEW COMPARISON:  07/07/2018. FINDINGS: A poor inspiration is again demonstrated with a grossly normal sized heart. Interval minimal right basilar atelectasis. Otherwise, clear lungs. Coronary  artery stent. Thoracic spine degenerative changes. IMPRESSION: Poor inspiration with minimal right basilar atelectasis. Electronically Signed   By: Claudie Revering M.D.   On: 07/09/2018 11:42   ASSESSMENT AND PLAN:    [name]  is a 57 y.o. male who presents with chief complaint as above.  Patient was brought to the ED by his son who states that he became more lethargic today after being out and about.  He had some slurred speech and was confused. Here in the ED he was febrile, tachycardic, with leukocytosis.  *Acute encephalopathy metabolic from underlying sepsis from acute colitis Almost back to his baseline CT head negative  *Severe sepsis ([place]) suspected due to severe colitis -broad-spectrum-IV antibiotics cefepime and Flagyl    Patient has a history of heart failure, and so we will administer IV fluids cautiously and trend lactic until within normal limits.  Blood pressure has been better Procalcitonin 124.10-52.6  urine culture negative MRSA PCR negative Blood culture no growth so far  * gerd -ppi and pepcid zofran prn  *Acute  Colitis -IV antibiotics cefepime and flagyl, other treatment as above FULL LIQUIDs and advance diet as tolerated   *  Acute renal failure superimposed on chronic kidney disease (HCC)-ATN -Creatinine 2.59-1.61-1.11 -IV fluids as above, avoid nephrotoxins and monitor Holding Entresto  *Non-STEMI acute- in the setting of history of   Coronary artery disease -continue home meds -seen by cardiology. Follow the recommendation from cardiology -f/u echo -cont medical management -troponin 11.48-7.08 -IV heparin gtt discontinued by cardiologist today -plavix,coreg, crestor, aspirin -Holding entresto -Inpatient cardiac catheterization potentially be considered prior to the discharge as renal function improving ,d/w dr.Arida  *  DM (diabetes mellitus), type 2, uncontrolled with complications ([place]) -findings scale insulin with corresponding glucose checks -Lantus and ssi  *  Chronic combined systolic and diastolic CHF (congestive heart failure) ([place]) -cautious IV fluid administration as above, continue home meds     *Hyperlipidemia -Home dose antilipid  PT  eval  D/c foley,OOB   Case discussed with Care Management/Social Worker. Management plans discussed with the patient, family and they are in agreement.  CODE STATUS: full  DVT Prophylaxis: heparin gtt  TOTAL TIME TAKING CARE OF THIS PATIENT: 35 minutes.  >50% time spent on counselling and coordination of care  POSSIBLE D/C IN *fewDAYS, DEPENDING ON CLINICAL CONDITION.  Note: This dictation was prepared with Dragon dictation along with smaller phrase technology. Any transcriptional errors that result from this process are unintentional.  Nicholes Mango M.D on 07/11/2018 at 10:45 AM  Between 7am to 6pm - Pager - [phone]  After 6pm go to [email or web address] - password [email or web address] [facility]  Office  [phone]  CC: Primary care physician; [name], MDPatient ID: [name], male   DOB: 19-Sep-1961, 57 y.o.   MRN: [id number]

## 2018-07-11 NOTE — Care Management Important Message (Signed)
Copy of signed IM left with patient in room.  

## 2018-07-11 NOTE — Progress Notes (Signed)
PT Cancellation Note  Patient Details Name: [name] MRN: [id number] DOB: 1961-01-06   Cancelled Treatment:    Reason Eval/Treat Not Completed: Patient declined, no reason specified.  Pt dry heaving and not feeling well and requests for PT to return next date.    Collie Siad PT, DPT 07/11/2018, 3:54 PM

## 2018-07-11 NOTE — Progress Notes (Signed)
Pharmacy Antibiotic Note  [name] is a 57 y.o. male admitted on 07/07/2018 with sepsis secondary to acute sigmoid colitis. Pharmacy has been consulted for cefepime dosing.  Also on metronidazole IV 500mg  q8h.   Plan: Increase to Cefepime 2g IV q8h  Height: 5\' 7"  (170.2 cm) Weight: 235 lb 14.3 oz (107 kg) IBW/kg (Calculated) : 66.1  Temp (24hrs), Avg:97.9 F (36.6 C), Min:97.8 F (36.6 C), Max:98.2 F (36.8 C)  Recent Labs  Lab 07/07/18 2005 07/08/18 0002 07/08/18 0420 07/08/18 0606 07/08/18 0946 07/09/18 0447 07/09/18 1321 07/10/18 0425 07/11/18 0458  WBC 1.7*  --   --  5.7  --  12.2*  --   --  6.8  CREATININE 1.74*  --   --  1.98*  --  2.59*  --  1.61* 1.11  LATICACIDVEN 2.5* 2.8* 3.3*  --  3.0*  --  1.9  --   --     Estimated Creatinine Clearance: 83.6 mL/min (by C-G formula based on SCr of 1.11 mg/dL).    Allergies  Allergen Reactions  . Metoprolol Rash  . Novolog [Insulin Aspart] Hives    Antimicrobials this admission: Vancomycin 10/3 x2    Cefepime 10/3 >>  Metronidazole 10/3 >>  Dose adjustments this admission:   Microbiology results: 10/4: BCx: NGTD   10/3: BCx: NGTD 10/3: MRSA PCR: negative  UCx NG  Thank you for allowing pharmacy to be a part of this patient's care.  Rocky Morel, PharmD  07/11/2018 2:23 PM

## 2018-07-11 NOTE — Progress Notes (Signed)
Inpatient Diabetes Program Recommendations  AACE/ADA: New Consensus Statement on Inpatient Glycemic Control (2015)  Target Ranges:  Prepandial:   less than 140 mg/dL      Peak postprandial:   less than 180 mg/dL (1-2 hours)      Critically ill patients:  140 - 180 mg/dL   Lab Results  Component Value Date   GLUCAP 162 (H) 07/11/2018   HGBA1C 7.9 (A) 06/01/2018    Review of Glycemic ControlResults for [name], Edgar "[name]" (MRN [id number]) as of 07/11/2018 11:37  Ref. Range 07/10/2018 16:41 07/10/2018 20:46 07/11/2018 00:10 07/11/2018 04:05 07/11/2018 08:01  Glucose-Capillary Latest Ref Range: 70 - 99 mg/dL 235 (H) 270 (H) 204 (H) 188 (H) 162 (H)   Diabetes history: Type 2 DM  Outpatient Diabetes medications: Humalog 1 unit for every 8 grams of CHO and 1 unit for every 20>120, Victoza 1.8 mg daily, Metformin 1000 mg bid, Lantus 25-30 units daily Current orders for Inpatient glycemic control:  Lantus 10 units q HS, Novolog moderate q 4 hours Inpatient Diabetes Program Recommendations:   Please consider increasing Lantus to 20 units daily.  Once eating consistently, will likely need the addition of meal coverage as well.  Will follow.   Thanks,  Adah Perl, RN, BC-ADM Inpatient Diabetes Coordinator Pager [phone] (8a-5p)

## 2018-07-11 NOTE — Plan of Care (Signed)
  Problem: Health Behavior/Discharge Planning: Goal: Ability to manage health-related needs will improve Outcome: Progressing Note:  Foley catheter d/c'd earlier in shift. Awaiting first urination to ensure patient is no longer retaining. Will continue to monitor. [name] [facility]

## 2018-07-11 NOTE — Progress Notes (Signed)
718   Progress Note  Patient Name: [name] Date of Encounter: 07/11/2018  Primary Cardiologist: [name], MD   Subjective   He feels significantly better overall but continues to be weak and nauseous.  He had bradycardia today in the setting of nausea but for the most part his heart rate is somewhat on the high side close to 100.  Inpatient Medications    Scheduled Meds: . aspirin EC  81 mg Oral Daily  . carvedilol  3.125 mg Oral BID WC  . clopidogrel  75 mg Oral Q breakfast  . enoxaparin (LOVENOX) injection  40 mg Subcutaneous Q24H  . fluticasone furoate-vilanterol  1 puff Inhalation Daily  . insulin glargine  20 Units Subcutaneous QHS  . insulin lispro  0-15 Units Subcutaneous Q4H  . montelukast  10 mg Oral QHS  . pantoprazole  40 mg Oral Daily  . pyridostigmine  60 mg Oral QID  . rosuvastatin  5 mg Oral Daily   Continuous Infusions: . sodium chloride Stopped (07/10/18 2320)  . sodium chloride 50 mL/hr at 07/11/18 1059  . ceFEPime (MAXIPIME) IV    . famotidine (PEPCID) IV 20 mg (07/11/18 1145)  . metronidazole 500 mg (07/11/18 1143)   PRN Meds: sodium chloride, acetaminophen **OR** acetaminophen, alum & mag hydroxide-simeth, ondansetron **OR** ondansetron (ZOFRAN) IV, oxyCODONE   Vital Signs    Vitals:   07/10/18 1708 07/10/18 2046 07/11/18 0402 07/11/18 0942  BP:  119/82 119/81 122/77  Pulse:  98 93 (!) 101  Resp:   18 19  Temp: 97.8 F (36.6 C) 97.8 F (36.6 C) 98.2 F (36.8 C)   TempSrc: Oral Oral Oral   SpO2:  97% 97% 95%  Weight:      Height:        Intake/Output Summary (Last 24 hours) at 07/11/2018 1718 Last data filed at 07/11/2018 1401 Gross per 24 hour  Intake 1119.54 ml  Output 1700 ml  Net -580.46 ml   Filed Weights   07/07/18 2006 07/09/18 1300  Weight: 107.5 kg 107 kg    Telemetry    Sinus tachycardia in addition to one episode of sinus bradycardia this afternoon.- Personally Reviewed  ECG      Physical Exam    Constitutional:  oriented to person, place, and time.  Supine in bed, no distress HENT:  Head: Normocephalic and atraumatic.  Eyes:  no discharge. No scleral icterus.  Neck: Normal range of motion. Neck supple. No JVD present.  Cardiovascular: Regular rate and rhythm,  normal heart sounds and intact distal pulses. Exam reveals no gallop and no friction rub. No edema No murmur heard. Pulmonary/Chest: Poor inspiratory effort, otherwise normal breath sounds,  No stridor. No respiratory distress.  no wheezes.  no rales.  no tenderness.  Abdominal: Soft.  no distension.  no tenderness.  Musculoskeletal: Exam not performed Neurological: Grossly normal, limited exam Skin: Skin is warm and dry. No rash noted. not diaphoretic.  Psychiatric:  normal mood and affect. behavior is normal. Thought content normal.    Labs    Chemistry Recent Labs  Lab 07/07/18 2005  07/09/18 0447 07/10/18 0425 07/11/18 0458  NA 138   < > 139 140 140  K 4.0   < > 4.7 3.9 4.2  CL 104   < > 108 111 112*  CO2 25   < > 21* 23 23  GLUCOSE 275*   < > 282* 144* 190*  BUN 36*   < > 65* 58* 31*  CREATININE 1.74*   < > 2.59* 1.61* 1.11  CALCIUM 8.7*   < > 7.5* 7.2* 7.3*  PROT 6.1*  --   --  5.3* 4.9*  ALBUMIN 3.4*  --   --  2.5* 2.4*  AST 40  --   --  32 27  ALT 30  --   --  25 24  ALKPHOS 86  --   --  49 54  BILITOT 1.4*  --   --  0.7 0.8  GFRNONAA 41*   < > 25* 45* >60  GFRAA 48*   < > 30* 52* >60  ANIONGAP 9   < > 10 6 5    < > = values in this interval not displayed.     Hematology Recent Labs  Lab 07/08/18 0606 07/09/18 0447 07/11/18 0458  WBC 5.7 12.2* 6.8  RBC 3.81* 3.63* 3.51*  HGB 11.6* 10.5* 10.3*  HCT 34.1* 31.7* 30.4*  MCV 89.5 87.3 86.7  MCH 30.4 29.1 29.3  MCHC 34.0 33.3 33.8  RDW 15.3* 15.9* 15.5*  PLT 122* 120* 113*    Cardiac Enzymes Recent Labs  Lab 07/08/18 0812 07/08/18 1632 07/08/18 2244  TROPONINI 11.48* 9.32* 7.08*   No results for input(s): TROPIPOC in the last 168  hours.   BNPNo results for input(s): BNP, PROBNP in the last 168 hours.   DDimer No results for input(s): DDIMER in the last 168 hours.   Radiology    No results found.  Cardiac Studies   Echocardiogram - Left ventricle: The cavity size was normal. Systolic function was   moderately reduced. The estimated ejection fraction was in the   range of 35% to 40%. Hypokinesis of the anteroseptal myocardium.   Hypokinesis of the apical myocardium. Hypokinesis of the anterior   myocardium. Doppler parameters are consistent with abnormal left   ventricular relaxation (grade 1 diastolic dysfunction). - Left atrium: The atrium was normal in size. - Right ventricle: Systolic function was normal. - Pulmonary arteries: Systolic pressure could not be accurately estimated  Patient Profile     89-year gentleman with coronary artery disease, multiple stents, diabetes type 2, myasthenia gravis, hypertension hyperlipidemia presenting with colitis, sepsis, non-STEMI  Assessment & Plan    A/P: 1) non-STEMI Known severe coronary artery disease with multiple stents in all 3 arteries. Myocardial infarction happened in the setting of severe sepsis, tachycardia and hypotension.  No significant EKG changes his echocardiogram showed an unchanged ejection fraction.  I discussed with the patient the possibility of proceeding with cardiac catheterization but he prefers to wait until he completely recovers from current illness.  I think that his reasonable considering severity of underlying sepsis. We can consider cardiac catheterization as an outpatient based on his symptoms. Before he got sick with sepsis, he was actually progressing very well at cardiac rehab. Continue dual antiplatelet therapy. Continue small dose rosuvastatin as he is intolerant to higher doses.  2) sepsis, colitis, CT scan suggesting colitis On broad-spectrum antibiotics Blood pressure still running low but slowly improving   3)  chronic combined systolic and diastolic CHF His heart failure medications have been on hold due to hypotension.  Resume carvedilol 3.125 mg twice daily.  Entresto can be resumed once blood pressure improves. He will also require torsemide before hospital discharge.  4) acute on chronic renal failure Renal function improved to baseline.    For questions or updates, please contact [place] Please consult [email or web address] for contact info under     Signed, Rogue Jury  Fletcher Anon, MD  07/11/2018, 5:18 PM

## 2018-07-11 NOTE — Progress Notes (Signed)
Pt refused cpap

## 2018-07-12 ENCOUNTER — Telehealth: Payer: Self-pay | Admitting: *Deleted

## 2018-07-12 LAB — CBC
HCT: 30.4 % — ABNORMAL LOW (ref 40.0–52.0)
HEMOGLOBIN: 10.4 g/dL — AB (ref 13.0–18.0)
MCH: 29.8 pg (ref 26.0–34.0)
MCHC: 34.1 g/dL (ref 32.0–36.0)
MCV: 87.3 fL (ref 80.0–100.0)
Platelets: 129 10*3/uL — ABNORMAL LOW (ref 150–440)
RBC: 3.48 MIL/uL — AB (ref 4.40–5.90)
RDW: 15.4 % — ABNORMAL HIGH (ref 11.5–14.5)
WBC: 7 10*3/uL (ref 3.8–10.6)

## 2018-07-12 LAB — GLUCOSE, CAPILLARY
GLUCOSE-CAPILLARY: 223 mg/dL — AB (ref 70–99)
GLUCOSE-CAPILLARY: 366 mg/dL — AB (ref 70–99)
GLUCOSE-CAPILLARY: 271 mg/dL — AB (ref 70–99)
Glucose-Capillary: 240 mg/dL — ABNORMAL HIGH (ref 70–99)

## 2018-07-12 LAB — CULTURE, BLOOD (ROUTINE X 2): Culture: NO GROWTH

## 2018-07-12 LAB — BASIC METABOLIC PANEL
Anion gap: 4 — ABNORMAL LOW (ref 5–15)
BUN: 23 mg/dL — ABNORMAL HIGH (ref 6–20)
CHLORIDE: 113 mmol/L — AB (ref 98–111)
CO2: 23 mmol/L (ref 22–32)
CREATININE: 1.02 mg/dL (ref 0.61–1.24)
Calcium: 7.5 mg/dL — ABNORMAL LOW (ref 8.9–10.3)
GFR calc Af Amer: >60 mL/min (ref >60)
GFR calc non Af Amer: >60 mL/min (ref >60)
Glucose, Bld: 232 mg/dL — ABNORMAL HIGH (ref 70–99)
POTASSIUM: 4.2 mmol/L (ref 3.5–5.1)
SODIUM: 140 mmol/L (ref 135–145)

## 2018-07-12 LAB — PROCALCITONIN: PROCALCITONIN: 6.85 ng/mL

## 2018-07-12 MED ORDER — SACUBITRIL-VALSARTAN 24-26 MG PO TABS
1.0000 | ORAL_TABLET | Freq: Two times a day (BID) | ORAL | Status: DC
  Administered 2018-07-13: 1 via ORAL
  Filled 2018-07-12: qty 1

## 2018-07-12 NOTE — Care Management Note (Signed)
Case Management Note  Patient Details  Name: [name] MRN: [id number] Date of Birth: May 05, 1961  Subjective/Objective:     Patient from home; admitted with sepsis.  Open to pulmonary rehab.  He uses a cane as needed.  Has neuropathy in bilateral feet.   Current with PCP.  Currently on room air.  Would like home O2 but does not qualify.  93-94% while ambulating.  Denies difficulty affording medications or with transportation.  A family member drives him to appointments.  Would benefit from home health RN, PT, OT.  PT has recommended a rolling walker.   Action/Plan:   Offered choice to Patient and he chose [facility] for home health and [facility] for DME.  Referrals made to both and accepted. No further needs at this time.    Expected Discharge Date:  07/12/18               Expected Discharge Plan:  [place]  In-House Referral:     Discharge planning Services  CM Consult  Post Acute Care Choice:  Durable Medical Equipment Choice offered to:  Patient  DME Arranged:  Walker rolling DME Agency:  [place] Arranged:  RN, OT, PT [facility] Agency:  [place]  Status of Service:  In process, will continue to follow  If discussed at Long Length of Stay Meetings, dates discussed:    Additional Comments:  Elza Rafter, RN 07/12/2018, 4:39 PM

## 2018-07-12 NOTE — Plan of Care (Signed)
  Problem: Activity: Goal: Risk for activity intolerance will decrease Outcome: Progressing Note:  Up to [facility] with 1 assist, tolerating well   Problem: Nutrition: Goal: Adequate nutrition will be maintained Outcome: Progressing Note:  Full liquid diet   Problem: Coping: Goal: Level of anxiety will decrease Outcome: Progressing   Problem: Elimination: Goal: Will not experience complications related to urinary retention Outcome: Progressing   Problem: Pain Managment: Goal: General experience of comfort will improve Outcome: Progressing Note:  Pt has chronic back pain, treated twice with oxycodone, which brought pain down from a 7 out of 10 to a 3 out of 10   Problem: Safety: Goal: Ability to remain free from injury will improve Outcome: Progressing   Problem: Skin Integrity: Goal: Risk for impaired skin integrity will decrease Outcome: Progressing

## 2018-07-12 NOTE — Evaluation (Signed)
Occupational Therapy Evaluation Patient Details Name: [name] MRN: [id number] DOB: 1961/01/03 Today's Date: 07/12/2018    History of Present Illness Pt is a 57 y/o M who presented with reports of lethargy with slurred speech and confusion.  Pt admitted with severe sepsis from acute colitis.  Pt with subsequent NSTEMI in setting of sepsis.  Pt's PMH includes CHF, peripheral neuropathy, myasthenia gravis, lyme disease, MI.    Clinical Impression   Pt seen for OT evaluation this date. Pt was modified independent in all ADLs and mobility, living in a 1 story condo with 5 steps to enter. Pt reports becoming easily fatigued or out of breath with minimize exertion, requiring modifications to his schedule/routines for ADL/IADL to accommodate for intermittent issues with activity tolerance 2/2 MG. Pt currently requires PRN minimal assist and positioning changes to LB ADL tasks to maximize safety/independence due to poor activity tolerance, impaired strength, neuropathy in bilat feet, chronic lower back pain, and cardiopulmonary status. Pt educated in energy conservation conservation strategies including activity pacing, home/routines modifications, work simplification, AE/DME, prioritizing of meaningful occupations, and falls prevention. Handout provided. Pt educated in medication mgt strategies to support recall/scheduling of pain medications. Pt verbalized understanding but would benefit from additional skilled OT services upon discharge to maximize recall and carryover of learned techniques and facilitate implementation of learned techniques into daily routines at home. Upon discharge, recommend [place] services. All other OT needs can be met at next venue of care. Will sign off.     Follow Up Recommendations  Home health OT    Equipment Recommendations  Other (comment)(consider long handled sponge/shoe horn, etc. Grab bar for shower)    Recommendations for Other Services       Precautions /  Restrictions Precautions Precautions: Fall;Other (comment) Precaution Comments: monitor HR Restrictions Weight Bearing Restrictions: No      Mobility Bed Mobility             General bed mobility comments: deferred, mod indep w/ PT earlier  Transfers               General transfer comment: deferred, Min A w/ PT earlier    Balance                                           ADL either performed or assessed with clinical judgement   ADL Overall ADL's : Modified independent                                       General ADL Comments: PRN assist for LB ADL. Pt requires additional time/effort and modifications to tasks and positioning in order to maximize safety/independence.      Vision Baseline Vision/History: Wears glasses Wears Glasses: At all times Patient Visual Report: No change from baseline       Perception     Praxis      Pertinent Vitals/Pain Pain Assessment: 0-10 Pain Score: 5  Pain Location: chronic LBP Pain Descriptors / Indicators: Aching Pain Intervention(s): Limited activity within patient's tolerance;Monitored during session     Hand Dominance Left   Extremity/Trunk Assessment Upper Extremity Assessment Upper Extremity Assessment: Generalized weakness(Grossly RUE 3/5, LUE 3-/5)   Lower Extremity Assessment Lower Extremity Assessment: Defer to PT evaluation   Cervical / Trunk Assessment Cervical /  Trunk Assessment: Other exceptions Cervical / Trunk Exceptions: h/o chronic LBP   Communication Communication Communication: No difficulties   Cognition Arousal/Alertness: Awake/alert Behavior During Therapy: WFL for tasks assessed/performed Overall Cognitive Status: Within Functional Limits for tasks assessed                                     General Comments       Exercises Other Exercises Other Exercises: Pt educated in energy conservation strategies including activity pacing,  work simplification, home/routines modifications, AE/DME, rest breaks, and falls prevention strategies across ADL and IADL to maximize safety/indep and minimize risk of overexertion. Handout provided.  Other Exercises: Pt educated in compensatory strategies for pain medication mgt to enhance pt's pain mgt routine and minimize risk of a "missed dose" of pain medicine that could exaccerbate pt's chronic LBP. (Pt endorses this occurs occasionally)   Shoulder Instructions      Home Living Family/patient expects to be discharged to:: Private residence Living Arrangements: Spouse/significant other;Children(son) Available Help at Discharge: Family(son, 85% of the time when not in school) Type of Home: Other(Comment)(condo) Home Access: Stairs to enter Entrance Stairs-Number of Steps: 5 (1+4) Entrance Stairs-Rails: Right Home Layout: One level     Bathroom Shower/Tub: Occupational psychologist: Handicapped height Bathroom Accessibility: Yes   Home Equipment: Shower seat - built in;Tub bench;Bedside commode;Cane - single point;Transport chair;Adaptive equipment Adaptive Equipment: Reacher;Sock aid Additional Comments: pt verbalizes plan to have grab bars installed in the shower soon.      Prior Functioning/Environment Level of Independence: Independent with assistive device(s)        Comments: Indep with ambulation, modified indep with ADL, cooking, and med mgt. Has had to make adaptations to routines 2/2 activity tolerance. No falls in past 6 months. Son/wife drive. Son living with them now and does all cleaning and some of the cooking. Pt was ambulating up to 3/4 mile at Cardiac Rehab, sometimes limited by Myasthenia Gravis        OT Problem List: Decreased strength;Decreased knowledge of use of DME or AE;Cardiopulmonary status limiting activity;Decreased activity tolerance;Impaired balance (sitting and/or standing);Impaired sensation;Pain      OT Treatment/Interventions:       OT Goals(Current goals can be found in the care plan section) Acute Rehab OT Goals Patient Stated Goal: "get back to Cardiac Rehab" OT Goal Formulation: All assessment and education complete, DC therapy  OT Frequency:     Barriers to D/C:            Co-evaluation              AM-PAC PT "6 Clicks" Daily Activity     Outcome Measure Help from another person eating meals?: None Help from another person taking care of personal grooming?: None Help from another person toileting, which includes using toliet, bedpan, or urinal?: A Little Help from another person bathing (including washing, rinsing, drying)?: A Little Help from another person to put on and taking off regular upper body clothing?: None Help from another person to put on and taking off regular lower body clothing?: A Little 6 Click Score: 21   End of Session    Activity Tolerance: Patient tolerated treatment well Patient left: in bed;with call bell/phone within reach  OT Visit Diagnosis: Other abnormalities of gait and mobility (R26.89);Muscle weakness (generalized) (M62.81);Pain Pain - Right/Left: Left Pain - part of body: (lower back )  Time: 2820-8138 OT Time Calculation (min): 26 min Charges:  OT General Charges $OT Visit: 1 Visit OT Evaluation $OT Eval Low Complexity: 1 Low OT Treatments $Self Care/Home Management : 8-22 mins  Jeni Salles, MPH, MS, OTR/L ascom [phone] 07/12/18, 5:06 PM

## 2018-07-12 NOTE — Telephone Encounter (Signed)
[name] called to let us know that he was in the hospital for sepsis, hypoxia, and new NSTEMI.  He would like to return to class once cleared and told him that he will need a note to return.

## 2018-07-12 NOTE — Progress Notes (Signed)
Patient ate soft diet for lunch, reported some gagging/ dry heaves. Patient also ambulated with one person assist around nurses station, tolerated well. MD notified, D/C cancelled for today.

## 2018-07-12 NOTE — Progress Notes (Addendum)
[facility] [place] NAME: [name]    MR#:  [id number]  DATE OF BIRTH:  1961/08/01  SUBJECTIVE:  patient came in with fever altered mental status and was found to have acute colitis.  Nauseous, weak, denies any vomiting.  Denies any chest pain or shortness of breath Transferred from ICU to telemetry 07/09/2018  REVIEW OF SYSTEMS:   Review of Systems  Constitutional: Negative for chills, fever and weight loss.  HENT: Negative for ear discharge, ear pain and nosebleeds.   Eyes: Negative for blurred vision, pain and discharge.  Respiratory: Negative for sputum production, shortness of breath, wheezing and stridor.   Cardiovascular: Negative for chest pain, palpitations, orthopnea and PND.  Gastrointestinal: Negative for abdominal pain, diarrhea, nausea and vomiting.  Genitourinary: Negative for frequency and urgency.  Musculoskeletal: Negative for back pain and joint pain.  Neurological: Negative for sensory change, speech change, focal weakness and weakness.  Psychiatric/Behavioral: Negative for depression and hallucinations. The patient is not nervous/anxious.    Tolerating Diet:clear Tolerating PT:   DRUG ALLERGIES:   Allergies  Allergen Reactions  . Metoprolol Rash  . Novolog [Insulin Aspart] Hives    VITALS:  Blood pressure (!) 153/88, pulse 86, temperature 97.8 F (36.6 C), temperature source Oral, resp. rate 18, height 5\' 7"  (1.702 m), weight 110.8 kg, SpO2 99 %.  PHYSICAL EXAMINATION:   Physical Exam  GENERAL:  57 y.o.-year-old patient lying in the bed with no acute distress.  EYES: Pupils equal, round, reactive to light and accommodation. No scleral icterus. Extraocular muscles intact.  HEENT: Head atraumatic, normocephalic. Oropharynx and nasopharynx clear.  NECK:  Supple, no jugular venous distention. No thyroid enlargement, no tenderness.  LUNGS: Normal breath sounds bilaterally, no wheezing, rales,  rhonchi. No use of accessory muscles of respiration.  CARDIOVASCULAR: S1, S2 normal. No murmurs, rubs, or gallops.  ABDOMEN: Soft, nontender, nondistended. Bowel sounds present. No organomegaly or mass.  EXTREMITIES: No cyanosis, clubbing or edema b/l.    NEUROLOGIC: Cranial nerves II through XII are intact. No focal Motor or sensory deficits b/l.   PSYCHIATRIC:  patient is alert and oriented x 3.  SKIN: No obvious rash, lesion, or ulcer.   LABORATORY PANEL:  CBC Recent Labs  Lab 07/12/18 0440  WBC 7.0  HGB 10.4*  HCT 30.4*  PLT 129*    Chemistries  Recent Labs  Lab 07/11/18 0458 07/12/18 0440  NA 140 140  K 4.2 4.2  CL 112* 113*  CO2 23 23  GLUCOSE 190* 232*  BUN 31* 23*  CREATININE 1.11 1.02  CALCIUM 7.3* 7.5*  AST 27  --   ALT 24  --   ALKPHOS 54  --   BILITOT 0.8  --    Cardiac Enzymes Recent Labs  Lab 07/08/18 2244  TROPONINI 7.08*   RADIOLOGY:  No results found. ASSESSMENT AND PLAN:   [name]  is a 57 y.o. male who presents with chief complaint as above.  Patient was brought to the ED by his son who states that he became more lethargic today after being out and about.  He had some slurred speech and was confused. Here in the ED he was febrile, tachycardic, with leukocytosis.  *Acute encephalopathy metabolic from underlying sepsis from acute colitis back to his baseline CT head negative  *Severe sepsis (HCC) suspected due to severe colitis -broad-spectrum-IV antibiotics cefepime and Flagyl    Patient has a history of heart failure, and so we  will administer IV fluids cautiously and trend lactic until within normal limits.  Blood pressure has been better Procalcitonin 124.10-52.6 -6.85 urine culture negative MRSA PCR negative Blood culture no growth so far  * gerd -ppi and pepcid zofran prn  *Acute  Colitis -IV antibiotics cefepime and flagyl, other treatment as above Soft diet and advance diet as tolerated   *  Acute renal failure  superimposed on chronic kidney disease (HCC)-ATN Clinically improved -Creatinine 2.59-1.61-1.11-1.02 -IV fluids as above, avoid nephrotoxins and monitor Resume Entresto  *Non-STEMI acute- in the setting of history of   Coronary artery disease -continue home meds -seen by cardiology. Follow the recommendation from cardiology -f/u echo -cont medical management -troponin 11.48-7.08 -IV heparin gtt discontinued by cardiologist today -plavix,coreg, crestor, aspirin Resume Entresto -Inpatient cardiac catheterization potentially be considered prior to the discharge as renal function improving ,d/w dr.Arida  *  DM (diabetes mellitus), type 2, uncontrolled with complications ([place]) -findings scale insulin with corresponding glucose checks -Lantus and ssi  *  Chronic combined systolic and diastolic CHF (congestive heart failure) ([place]) -cautious IV fluid administration as above, continue home meds     *Hyperlipidemia -Home dose antilipid  *Chronic myasthenia gravis-outpatient follow-up as recommended  PT eval-home health PT  D/c foley,OOB   Case discussed with Care Management/Social Worker. Management plans discussed with the patient, family and they are in agreement.  CODE STATUS: full  DVT Prophylaxis: heparin gtt  TOTAL TIME TAKING CARE OF THIS PATIENT: 35 minutes.  >50% time spent on counselling and coordination of care  POSSIBLE D/C IN *fewDAYS, DEPENDING ON CLINICAL CONDITION.  Note: This dictation was prepared with Dragon dictation along with smaller phrase technology. Any transcriptional errors that result from this process are unintentional.  Nicholes Mango M.D on 07/12/2018 at 11:17 PM  Between 7am to 6pm - Pager - [phone]  After 6pm go to [email or web address] - password [email or web address] [facility] [facility]  Office  [phone]  CC: Primary care physician; [name], MDPatient ID: [name], male   DOB: 02-15-1961, 57 y.o.   MRN: [id number]

## 2018-07-12 NOTE — Progress Notes (Signed)
SATURATION QUALIFICATIONS: (This note is used to comply with regulatory documentation for home oxygen)  Patient Saturations on Room Air at Rest = 97%  Patient Saturations on Room Air while Ambulating = 95%  Patient Saturations on n/a Liters of oxygen while Ambulating = n/a%  Please briefly explain why patient needs home oxygen: 

## 2018-07-12 NOTE — Progress Notes (Signed)
PT Evaluation Note  Assessment: Pt admitted with above diagnosis. Pt currently with functional limitations due to the deficits listed below (see PT Problem List). [name] demonstrates instability with transfers and ambulation, requiring up to min assist with sit>stand transfer and intermittent min A while ambulating to remain steady.  Instructed pt to begin using RW to ambulate for safety and recommending HHPT to address balance impairments.  Pt will benefit from skilled PT to increase their independence and safety with mobility to allow discharge to the venue listed below.      07/12/18 0847  PT Visit Information  Last PT Received On 07/12/18  Assistance Needed +1  History of Present Illness Pt is a 57 y/o M who presented with reports of lethargy with slurred speech and confusion.  Pt admitted with severe sepsis from acute colitis.  Pt with subsequent NSTEMI in setting of sepsis.  Pt's PMH includes CHF, peripheral neuropathy, myasthenia gravis, lyme disease, MI.   Precautions  Precautions Fall;Other (comment)  Precaution Comments monitor HR  Restrictions  Weight Bearing Restrictions No  Home Living  Family/patient expects to be discharged to: Private residence  Living Arrangements Spouse/significant other  Available Help at Discharge Family (son, 85% of the time when not in school)  Type of Home Other(Comment) (condo)  Home Access Stairs to enter  Entrance Stairs-Number of Steps 5 (1+4)  Entrance Stairs-Rails Right  Home Layout One level  Bathroom Engineer, technical sales Handicapped height  Bathroom Accessibility Yes  Home Equipment Shower seat - built in;Tub bench;BSC;Cane - single point;Transport chair  Prior Function  Level of Independence Independent  Comments Pt ambulates without AD.  Denies any falls in the past 6 months.  Pt showers and dresses ind but leans on wall.  Ind with cooking, cleaning.  Son or wife does the driving.  Pt was ambulating up to 3/4  of a mile at Cardiac Rehab, sometimes limited by Myasthenia Gravis.   Communication  Communication No difficulties  Pain Assessment  Pain Assessment 0-10  Pain Score 3  Pain Location chronic LBP  Pain Descriptors / Indicators Aching  Pain Intervention(s) Limited activity within patient's tolerance;Monitored during session;Repositioned  Cognition  Arousal/Alertness Awake/alert  Behavior During Therapy WFL for tasks assessed/performed  Overall Cognitive Status Within Functional Limits for tasks assessed  Upper Extremity Assessment  Upper Extremity Assessment Defer to OT evaluation (Grossly RUE 3/5, LUE 3-/5)  Lower Extremity Assessment  Lower Extremity Assessment RLE deficits/detail;LLE deficits/detail  RLE Deficits / Details Hip and knee strength grossly 3/5, DF 5/5  RLE Sensation history of peripheral neuropathy  LLE Deficits / Details Hip and knee strength grossly 3-/5, DF 5/5  LLE Sensation history of peripheral neuropathy  Cervical / Trunk Assessment  Cervical / Trunk Assessment Other exceptions  Cervical / Trunk Exceptions h/o chronic LBP  Bed Mobility  Overal bed mobility Modified Independent  General bed mobility comments Increased time and effort with HOB elevated. No physical assist or cues needed.  Transfers  Overall transfer level Needs assistance  Equipment used None  Transfers Sit to/from Stand  Sit to Stand Min assist  General transfer comment Min assist to maintain balance with sit>stand as pt initially placing majority of weight through Bil heels.    Ambulation/Gait  Ambulation/Gait assistance Min assist  Gait Distance (Feet) 160 Feet  Assistive device None  Gait Pattern/deviations Staggering left;Staggering right  General Gait Details Pt initially reaching out for support from wall, furniture.  Occasional min assist as pt staggers  L and R unexpectedly.  Balance improves slightly as pt ambulates farther.    Balance  Overall balance assessment Needs assistance   Sitting-balance support No upper extremity supported;Feet supported  Sitting balance-Leahy Scale Good  Standing balance support During functional activity;No upper extremity supported  Standing balance-Leahy Scale Fair  Standing balance comment Pt able to stand statically without UE support but requires intermittent min assist while ambulating to remain steady  General Comments  General comments (skin integrity, edema, etc.) HR 102 at rest, up to 124 with ambulation.  Instructed pt to use RW moving forward while ambulating due to imbalance.    Exercises  Exercises Other exercises  Other Exercises  Other Exercises Encouraged pt to ambulate with nursing staff at least 3x/day in the hallway  PT - End of Session  Equipment Utilized During Treatment Gait belt  Activity Tolerance Patient tolerated treatment well  Patient left in chair;with call bell/phone within reach;with chair alarm set  Nurse Communication Mobility status;Other (comment) (HR)  PT Assessment  PT Recommendation/Assessment Patient needs continued PT services  PT Visit Diagnosis Unsteadiness on feet (R26.81);Other abnormalities of gait and mobility (R26.89);Muscle weakness (generalized) (M62.81)  PT Problem List Decreased strength;Decreased balance;Decreased knowledge of use of DME;Decreased safety awareness  PT Plan  PT Frequency (ACUTE ONLY) Min 2X/week  PT Treatment/Interventions (ACUTE ONLY) DME instruction;Gait training;Stair training;Functional mobility training;Therapeutic activities;Therapeutic exercise;Neuromuscular re-education;Balance training;Patient/family education  AM-PAC PT "6 Clicks" Daily Activity Outcome Measure  Difficulty turning over in bed (including adjusting bedclothes, sheets and blankets)? 3  Difficulty moving from lying on back to sitting on the side of the bed?  3  Difficulty sitting down on and standing up from a chair with arms (e.g., wheelchair, bedside commode, etc,.)? 1  Help needed moving to  and from a bed to chair (including a wheelchair)? 3  Help needed walking in hospital room? 3  Help needed climbing 3-5 steps with a railing?  3  6 Click Score 16  Mobility G Code  CK  PT Recommendation  Recommendations for Other Services OT consult (for safety with ADLs due to balance impairments)  Follow Up Recommendations Home health PT  PT equipment Rolling walker with 5" wheels (pt to check with wife about potential RW at home)  Individuals Consulted  Consulted and Agree with Results and Recommendations Patient  Acute Rehab PT Goals  Patient Stated Goal to return to PLOF  PT Goal Formulation With patient  Time For Goal Achievement 07/26/18  Potential to Achieve Goals Good  PT Time Calculation  PT Start Time (ACUTE ONLY) 0840  PT Stop Time (ACUTE ONLY) 0913  PT Time Calculation (min) (ACUTE ONLY) 33 min  PT General Charges  $$ ACUTE PT VISIT 1 Visit  PT Evaluation  $PT Eval Moderate Complexity 1 Mod  PT Treatments  $Gait Training 8-22 mins  $Therapeutic Activity 8-22 mins  Written Expression  Dominant Hand Left   Collie Siad PT, DPT

## 2018-07-13 ENCOUNTER — Encounter: Payer: Self-pay | Admitting: *Deleted

## 2018-07-13 DIAGNOSIS — Z955 Presence of coronary angioplasty implant and graft: Secondary | ICD-10-CM

## 2018-07-13 LAB — GLUCOSE, CAPILLARY
Glucose-Capillary: 227 mg/dL — ABNORMAL HIGH (ref 70–99)
Glucose-Capillary: 302 mg/dL — ABNORMAL HIGH (ref 70–99)

## 2018-07-13 LAB — CULTURE, BLOOD (ROUTINE X 2)
Culture: NO GROWTH
Special Requests: ADEQUATE

## 2018-07-13 LAB — PROCALCITONIN: Procalcitonin: 2.95 ng/mL

## 2018-07-13 MED ORDER — LEVOFLOXACIN 500 MG PO TABS: 500.0000 mg | ORAL_TABLET | Freq: Every day | ORAL | 0 refills | Status: AC

## 2018-07-13 MED ORDER — METRONIDAZOLE 500 MG PO TABS: 500.0000 mg | ORAL_TABLET | Freq: Three times a day (TID) | ORAL | 0 refills | Status: AC

## 2018-07-13 NOTE — Discharge Summary (Signed)
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 Allergy    dust, seasonal (worse in the fall).  . 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 Followed by Pain Specialist in [place], back and neck  . Asthma    BRONCHITIS  . Cataract    First Dx in 2012  . Chronic combined systolic and diastolic congestive heart failure ([place])    a. 03/2018 Echo: EF 30-35%, ant, antlat, apical AK, Gr1 DD.  [name] Coronary artery disease    a. Prior Ant MI->s/p multiple stents placed in the LAD and right coronary artery ([place]); b. 2016 Cath: reportedly nonobs dzs;  c. 02/2017 MV: EF 45-54%, ap/ant, ap/inf, apical infarct, no ishcemia; c. 04/2018 Cath/PCI: LM nl, LAD 20p, patent mid stent, LCX 43m3.25x15 [place] DES), OM1 nl, OM2 50, OM3 40 w/ patent stent, RCA 40p, 225m40d w/ patent stent in RPDA, RPAV 60, EF 25-35%. 2+MR.  . Diabetes mellitus without complication ([place]   TYPE 2  . Diabetic peripheral neuropathy ([facility])    feet and hands  . GERD (gastroesophageal reflux disease)   . Headache    muscle tension  . Hyperlipidemia   . Hypertension    CONTROLLED ON MEDS  . Insomnia   . Ischemic cardiomyopathy    a. 03/2018 Echo: EF 30-35%, ant, antlat, apical AK, Gr1 DD, mild MR, mildly dil LA.  . Marland Kitchenyme  disease    Chronic  . Myasthenia gravis ([facility]  . Myocardial infarction ([facility]  . Seasonal allergies   . Sleep apnea    CPAP     ADMITTING HISTORY TiKipling Graseris a [age].o. male who presents with chief complaint as above.  Patient was brought to the ED by his son who states that he became more lethargic today after being out and about.  He had some slurred speech and was confused.  This started in the afternoon.  Here in the ED he was febrile, tachycardic, with leukocytosis.  Work-up revealed only possible colitis on imaging.  Hospitalist were called for admission  HOSPITAL COURSE:  Patient presented to the emergency room with fever, tachycardia and leukocytosis and confusion. Patient was started on broad-spectrum antibiotics.  He received IV fluids for acute on chronic renal failure.  Patient's procalcitonin was elevated during the hospitalization .  MRSA PCR was negative.  Culture but did not reveal any growth.  Urine culture did not reveal any growth.  Patient received IV Flagyl and cefepime antibiotics.  CT abdomen revealed acute colitis. His colitis improved and responded to IV antibiotics.  Leukocytosis resolved patient.  Patient had elevated troponin and non-ST elevation MI.  He was seen by cardiology who did not recommend any acute cardiac catheterization or intervention.  He received aspirin, Plavix Coreg and Crestor and Entresto and continued medical management.  Advised to treat infection and  cardiac catheterization at a later date.  Patient's colitis improved his infection resolved and encephalopathy resolved.  Patient was worked up with CT abdomen, CT head and renal ultrasound during the hospitalization.  HIV test is negative. patient will be discharged home with outpatient follow-up with cardiology and primary care physician. CONSULTS OBTAINED:  Treatment Team:  [name], MD  DRUG ALLERGIES:   Allergies  Allergen Reactions  . Metoprolol Rash  . Novolog [Insulin  Aspart] Hives    DISCHARGE MEDICATIONS:   Allergies as of 07/13/2018      Reactions   Metoprolol Rash   Novolog [insulin Aspart] Hives      Medication List    STOP taking these medications   cyclobenzaprine 10 MG tablet Commonly known as:  FLEXERIL   furosemide 40 MG tablet Commonly known as:  LASIX   montelukast 10 MG tablet Commonly known as:  SINGULAIR   Oxycodone HCl 10 MG Tabs   pyridostigmine 60 MG tablet Commonly known as:  MESTINON   triamcinolone cream 0.1 % Commonly known as:  KENALOG     TAKE these medications   albuterol (2.5 MG/3ML) 0.083% nebulizer solution Commonly known as:  PROVENTIL Take 2.5 mg by nebulization every 6 (six) hours as needed for wheezing or shortness of breath.   albuterol 108 (90 Base) MCG/ACT inhaler Commonly known as:  PROVENTIL HFA;VENTOLIN HFA Inhale 2 puffs into the lungs every 6 (six) hours as needed for wheezing or shortness of breath.   aspirin EC 81 MG tablet Take 1 tablet (81 mg total) by mouth daily.   BREO ELLIPTA 200-25 MCG/INH Aepb Generic drug:  fluticasone furoate-vilanterol INHALE 1 PUFF INTO THE LUNGS DAILY What changed:  See the new instructions.   carvedilol 6.25 MG tablet Commonly known as:  COREG Take 1 tablet (6.25 mg total) by mouth 2 (two) times daily.   CLARITIN 10 MG tablet Generic drug:  loratadine Take 10 mg by mouth daily.   clopidogrel 75 MG tablet Commonly known as:  PLAVIX Take 1 tablet (75 mg total) by mouth daily with breakfast.   cyanocobalamin 1000 MCG tablet Take 1,000 mcg by mouth daily.   dexlansoprazole 60 MG capsule Commonly known as:  DEXILANT Take 1 capsule (60 mg total) by mouth daily.   ezetimibe 10 MG tablet Commonly known as:  ZETIA Take 1 tablet (10 mg total) by mouth daily.   FIFTY50 PEN NEEDLES 32G X 4 MM Misc Generic drug:  Insulin Pen Needle Inject 1 each into the skin as needed.   gabapentin 300 MG capsule Commonly known as:  NEURONTIN Take 1 capsule  (300 mg total) by mouth 3 (three) times daily. What changed:  Another medication with the same name was removed. Continue taking this medication, and follow the directions you see here.   HUMALOG KWIKPEN 100 UNIT/ML KiwkPen Generic drug:  insulin lispro Inject 7-8 Units into the skin 3 (three) times daily. Reported on 12/02/2015/ sliding scale 1 unit for every 8 units of carbs; and 1 unit for every 20 above 120   LANTUS SOLOSTAR 100 UNIT/ML Solostar Pen Generic drug:  Insulin Glargine Inject 25-30 Units into the skin daily at 10 pm.   levocetirizine 5 MG tablet Commonly known as:  XYZAL Take 1 tablet (5 mg total) by mouth every evening.   levofloxacin 500 MG tablet Commonly known as:  LEVAQUIN Take 1 tablet (500 mg total) by mouth daily for 5 days.   liraglutide 18 MG/3ML Sopn Commonly known as:  [employer]  1.8 units every morning What changed:    how much to take  how to take this  when to take this  additional instructions   metFORMIN 1000 MG tablet Commonly known as:  GLUCOPHAGE Take 1,000 mg by mouth 2 (two) times daily with a meal.   metroNIDAZOLE 500 MG tablet Commonly known as:  FLAGYL Take 1 tablet (500 mg total) by mouth 3 (three) times daily for 5 days.   naloxone 4 MG/0.1ML Liqd nasal spray kit Commonly known as:  NARCAN For excess sedation from opioids   nitroGLYCERIN 0.3 MG SL tablet Commonly known as:  NITROSTAT Place 1 tablet (0.3 mg total) under the tongue every 5 (five) minutes as needed for chest pain. Reported on 12/02/2015   rosuvastatin 5 MG tablet Commonly known as:  CRESTOR Take 5 mg by mouth daily.   sacubitril-valsartan 24-26 MG Commonly known as:  ENTRESTO Take 1 tablet by mouth 2 (two) times daily.       Today  Patient seen and evaluated today No abdominal pain No fever Tolerated diet well VITAL SIGNS:  Blood pressure (!) 140/92, pulse 98, temperature 97.7 F (36.5 C), temperature source Oral, resp. rate 16, height 5' 7" (1.702  m), weight 108.3 kg, SpO2 97 %.  I/O:    Intake/Output Summary (Last 24 hours) at 07/13/2018 1630 Last data filed at 07/13/2018 5361 Gross per 24 hour  Intake 940 ml  Output 0 ml  Net 940 ml    PHYSICAL EXAMINATION:  Physical Exam  GENERAL:  57 y.o.-year-old patient lying in the bed with no acute distress.  LUNGS: Normal breath sounds bilaterally, no wheezing, rales,rhonchi or crepitation. No use of accessory muscles of respiration.  CARDIOVASCULAR: S1, S2 normal. No murmurs, rubs, or gallops.  ABDOMEN: Soft, non-tender, non-distended. Bowel sounds present. No organomegaly or mass.  NEUROLOGIC: Moves all 4 extremities. PSYCHIATRIC: The patient is alert and oriented x 3.  SKIN: No obvious rash, lesion, or ulcer.   DATA REVIEW:   CBC Recent Labs  Lab 07/12/18 0440  WBC 7.0  HGB 10.4*  HCT 30.4*  PLT 129*    Chemistries  Recent Labs  Lab 07/11/18 0458 07/12/18 0440  NA 140 140  K 4.2 4.2  CL 112* 113*  CO2 23 23  GLUCOSE 190* 232*  BUN 31* 23*  CREATININE 1.11 1.02  CALCIUM 7.3* 7.5*  AST 27  --   ALT 24  --   ALKPHOS 54  --   BILITOT 0.8  --     Cardiac Enzymes Recent Labs  Lab 07/08/18 2244  TROPONINI 7.08*    Microbiology Results  Results for orders placed or performed during the hospital encounter of 07/07/18  Culture, blood (Routine x 2)     Status: None   Collection Time: 07/07/18  8:05 PM  Result Value Ref Range Status   Specimen Description BLOOD RIGHT ANTECUBITAL  Final   Special Requests   Final    BOTTLES DRAWN AEROBIC AND ANAEROBIC Blood Culture results may not be optimal due to an excessive volume of blood received in culture bottles   Culture   Final    NO GROWTH 5 DAYS Performed at [facility], [address] [place], [place] [address]    Report Status 07/12/2018 FINAL  Final  MRSA PCR Screening     Status: None   Collection Time: 07/08/18  5:44 AM  Result Value Ref Range Status   MRSA by PCR NEGATIVE NEGATIVE Final  Comment:        The GeneXpert MRSA Assay (FDA approved for NASAL specimens only), is one component of a comprehensive MRSA colonization surveillance program. It is not intended to diagnose MRSA infection nor to guide or monitor treatment for MRSA infections. Performed at [facility], [address] [place], [place] [address]   Culture, blood (Routine x 2)     Status: None   Collection Time: 07/08/18  6:06 AM  Result Value Ref Range Status   Specimen Description BLOOD BLOOD RIGHT HAND  Final   Special Requests   Final    BOTTLES DRAWN AEROBIC AND ANAEROBIC Blood Culture adequate volume   Culture   Final    NO GROWTH 5 DAYS Performed at [facility], [address] [place], [place] [address]    Report Status 07/13/2018 FINAL  Final  Urine Culture     Status: None   Collection Time: 07/08/18 10:37 AM  Result Value Ref Range Status   Specimen Description   Final    URINE, RANDOM Performed at [facility], [address], [place], [place] [address]    Special Requests   Final    NONE Performed at [facility], [address], [place], [place] [address]    Culture   Final    NO GROWTH Performed at [facility], [place] [address], [place], [place] [address]    Report Status 07/10/2018 FINAL  Final    RADIOLOGY:  No results found.  Follow up with PCP in 1 week.  Management plans discussed with the patient, family and they are in agreement.  CODE STATUS: Full code Code Status History    Date Active Date Inactive Code Status Order ID Comments User Context   07/08/2018 0449 07/13/2018 1559 Full Code [id number]  Lance Coon, MD ED   04/25/2018 1517 04/26/2018 1417 Full Code [id number]  [name], MD Inpatient    Advance Directive Documentation     Most Recent Value  Type of Advance Directive  Living will  Pre-existing out of facility DNR order (yellow form or pink MOST form)  -  "MOST" Form in Place?  -      TOTAL  TIME TAKING CARE OF THIS PATIENT ON DAY OF DISCHARGE: more than 35 minutes.   Saundra Shelling M.D on 07/13/2018 at 4:30 PM  Between 7am to 6pm - Pager - [phone]  After 6pm go to [email or web address] - password [email or web address] [facility]  Office  [phone]  CC: Primary care physician; [name], MD  Note: This dictation was prepared with Dragon dictation along with smaller phrase technology. Any transcriptional errors that result from this process are unintentional.

## 2018-07-13 NOTE — Progress Notes (Signed)
Cardiac Individual Treatment Plan  Patient Details  Name: [name] MRN: [id number] Date of Birth: Nov 14, 1960 Referring Provider:     Cardiac Rehab from 05/12/2018 in [facility] Cardiac [facility] Pulmonary Rehab  Referring Provider  Arida      Initial Encounter Date:    Cardiac Rehab from 05/12/2018 in [facility] Cardiac and Pulmonary Rehab  Date  05/12/18      Visit Diagnosis: Status post coronary artery stent placement  Patient's Home Medications on Admission: No current facility-administered medications for this visit.  No current outpatient medications on file.  Facility-Administered Medications Ordered in Other Visits:  .  0.9 %  sodium chloride infusion, , Intravenous, PRN, Gouru, Aruna, MD, Last Rate: 10 mL/hr at 07/13/18 0533, 500 mL at 07/13/18 0533 .  acetaminophen (TYLENOL) tablet 650 mg, 650 mg, Oral, Q6H PRN **OR** acetaminophen (TYLENOL) suppository 650 mg, 650 mg, Rectal, Q6H PRN, Lance Coon, MD .  alum & mag hydroxide-simeth (MAALOX/MYLANTA) 200-200-20 MG/5ML suspension 30 mL, 30 mL, Oral, Q4H PRN, Lance Coon, MD, 30 mL at 07/13/18 0531 .  aspirin EC tablet 81 mg, 81 mg, Oral, Daily, Lance Coon, MD, 81 mg at 07/12/18 6433 .  carvedilol (COREG) tablet 3.125 mg, 3.125 mg, Oral, BID WC, Arida, Muhammad A, MD, 3.125 mg at 07/12/18 1655 .  ceFEPIme (MAXIPIME) 2 g in sodium chloride 0.9 % 100 mL IVPB, 2 g, Intravenous, Q8H, Rocky Morel, RPH, Stopped at 07/12/18 2308 .  clopidogrel (PLAVIX) tablet 75 mg, 75 mg, Oral, Q breakfast, Lance Coon, MD, 75 mg at 07/12/18 2951 .  enoxaparin (LOVENOX) injection 40 mg, 40 mg, Subcutaneous, Q24H, Gouru, Aruna, MD, 40 mg at 07/12/18 1654 .  famotidine (PEPCID) IVPB 20 mg premix, 20 mg, Intravenous, Q12H, Nicholes Mango, MD, Stopped at 07/12/18 2347 .  fluticasone furoate-vilanterol (BREO ELLIPTA) 200-25 MCG/INH 1 puff, 1 puff, Inhalation, Daily, Lance Coon, MD, 1 puff at 07/12/18 [phone]  insulin glargine (LANTUS) injection 20 Units, 20  Units, Subcutaneous, QHS, Gouru, Aruna, MD, 20 Units at 07/12/18 2123 .  insulin lispro (HUMALOG) injection 0-15 Units, 0-15 Units, Subcutaneous, TID AC & HS, Lance Coon, MD, 8 Units at 07/12/18 2128 .  metroNIDAZOLE (FLAGYL) IVPB 500 mg, 500 mg, Intravenous, Q8H, Lance Coon, MD, Last Rate: 100 mL/hr at 07/13/18 0535, 500 mg at 07/13/18 0535 .  montelukast (SINGULAIR) tablet 10 mg, 10 mg, Oral, Corwin Levins, MD, 10 mg at 07/12/18 2118 .  ondansetron (ZOFRAN) tablet 4 mg, 4 mg, Oral, Q6H PRN, 4 mg at 07/11/18 1719 **OR** ondansetron (ZOFRAN) injection 4 mg, 4 mg, Intravenous, Q6H PRN, Lance Coon, MD, 4 mg at 07/10/18 2318 .  oxyCODONE (Oxy IR/ROXICODONE) immediate release tablet 10 mg, 10 mg, Oral, QID PRN, Lance Coon, MD, 10 mg at 07/13/18 6606 .  pantoprazole (PROTONIX) EC tablet 40 mg, 40 mg, Oral, Daily, Lance Coon, MD, 40 mg at 07/12/18 0823 .  pyridostigmine (MESTINON) tablet 60 mg, 60 mg, Oral, QID, Lance Coon, MD, 60 mg at 07/12/18 2118 .  rosuvastatin (CRESTOR) tablet 5 mg, 5 mg, Oral, Daily, Lance Coon, MD, 5 mg at 07/12/18 0824 .  sacubitril-valsartan (ENTRESTO) 24-26 mg per tablet, 1 tablet, Oral, BID, Gouru, Aruna, MD  Past Medical History: Past Medical History:  Diagnosis Date  . Allergy    dust, seasonal (worse in the fall).  . 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 Followed by Pain Specialist in [place], back and neck  . Asthma    BRONCHITIS  . Cataract  First Dx in 2012  . Chronic combined systolic and diastolic congestive heart failure ([place])    a. 03/2018 Echo: EF 30-35%, ant, antlat, apical AK, Gr1 DD.  [name] Coronary artery disease    a. Prior Ant MI->s/p multiple stents placed in the LAD and right coronary artery ([place]); b. 2016 Cath: reportedly nonobs dzs;  c. 02/2017 MV: EF 45-54%, ap/ant, ap/inf, apical infarct, no ishcemia; c. 04/2018 Cath/PCI: LM nl, LAD 20p, patent mid stent, LCX 2m3.25x15 Sierra DES), OM1 nl, OM2 50, OM3 40 w/ patent stent, RCA 40p,  262m40d w/ patent stent in RPDA, RPAV 60, EF 25-35%. 2+MR.  . Diabetes mellitus without complication ([place]   TYPE 2  . Diabetic peripheral neuropathy (H[facility])    feet and hands  . GERD (gastroesophageal reflux disease)   . Headache    muscle tension  . Hyperlipidemia   . Hypertension    CONTROLLED ON MEDS  . Insomnia   . Ischemic cardiomyopathy    a. 03/2018 Echo: EF 30-35%, ant, antlat, apical AK, Gr1 DD, mild MR, mildly dil LA.  . Marland Kitchenyme disease    Chronic  . My[place]henia gravis ([place]  . My[place]  . Seasonal allergies   . Sleep apnea    CPAP    Tobacco Use: Social History   Tobacco Use  Smoking Status Never Smoker  Smokeless Tobacco Never Used  Tobacco Comment   smoking cessation materials not required    Labs: Recent Review Flowsheet Data    Labs for ITP Cardiac and Pulmonary Rehab Latest Ref Rng & Units 11/29/2017 01/17/2018 01/25/2018 05/18/2018 06/01/2018   Cholestrol 0 - 200 141 113 - 109 -   LDLCALC - 77 62 - 50 -   HDL 35 - 70 38(L) 33(L) - 37 -   Trlycerides 40 - 160 130 92 - 112 -   Hemoglobin A1c 4.0 - 5.6 % - - 7.2 7.9(A) 7.9(A)       Exercise Target Goals: Exercise Program Goal: Individual exercise prescription set using results from initial 6 min walk test and THRR while considering  patient's activity barriers and safety.   Exercise Prescription Goal: Initial exercise prescription builds to 30-45 minutes a day of aerobic activity, 2-3 days per week.  Home exercise guidelines will be given to patient during program as part of exercise prescription that the participant will acknowledge.  Activity Barriers & Risk Stratification: Activity Barriers & Cardiac Risk Stratification - 05/12/18 1213      Activity Barriers & Cardiac Risk Stratification   Activity Barriers  Arthritis;Back Problems;Assistive Device;Neck/Spine Problems;Shortness of Breath;Muscular Weakness   Lower spine arthritis, does Steriod shots at pain clininc:unable at  present because on Plavix. has mynesthenia gravis   Cardiac Risk Stratification  High       6 Minute Walk: 6 Minute Walk    Row Name 05/12/18 1353         6 Minute Walk   Distance  1224 feet     Walk Time  6 minutes     # of Rest Breaks  0     MPH  2.32     METS  3.08     RPE  12     Perceived Dyspnea   2     VO2 Peak  10.8     Symptoms  No     Resting HR  97 bpm     Resting BP  126/72     Resting Oxygen Saturation  92 %     Exercise Oxygen Saturation  during 6 min walk  87 %     Max Ex. HR  116 bpm     Max Ex. BP  126/64     2 Minute Post BP  120/64        Oxygen Initial Assessment:   Oxygen Re-Evaluation:   Oxygen Discharge (Final Oxygen Re-Evaluation):   Initial Exercise Prescription: Initial Exercise Prescription - 05/12/18 1300      Date of Initial Exercise RX and Referring Provider   Date  05/12/18    Referring Provider  Arida      Treadmill   MPH  2.3    Grade  1    Minutes  15    METs  3.08      Recumbant Bike   Level  5    RPM  60    Watts  35    Minutes  15    METs  3      T5 Nustep   Level  2    SPM  80    Minutes  15    METs  3      Prescription Details   Frequency (times per week)  3    Duration  Progress to 30 minutes of continuous aerobic without signs/symptoms of physical distress      Intensity   THRR 40-80% of Max Heartrate  123-149    Ratings of Perceived Exertion  11-13    Perceived Dyspnea  0-4      Resistance Training   Training Prescription  Yes    Weight  4 lb    Reps  10-15       Perform Capillary Blood Glucose checks as needed.  Exercise Prescription Changes:  Exercise Prescription Changes    Row Name 05/24/18 1200 06/07/18 1600 06/09/18 0900 06/22/18 0900       Response to Exercise   Blood Pressure (Admit)  126/68  102/58  -  110/68    Blood Pressure (Exercise)  154/62  116/54  -  128/54    Blood Pressure (Exit)  124/78  124/58  -  128/64    Heart Rate (Admit)  70 bpm  97 bpm  -  72 bpm    Heart  Rate (Exercise)  130 bpm  1118 bpm  -  120 bpm    Heart Rate (Exit)  109 bpm  86 bpm  -  104 bpm    Rating of Perceived Exertion (Exercise)  12  13  -  13    Symptoms  none  none  -  none    Comments  first full week of exercise   -  -  -    Duration  Continue with 30 min of aerobic exercise without signs/symptoms of physical distress.  Continue with 30 min of aerobic exercise without signs/symptoms of physical distress.  -  Continue with 30 min of aerobic exercise without signs/symptoms of physical distress.    Intensity  THRR unchanged  THRR unchanged  -  THRR unchanged      Progression   Progression  Continue to progress workloads to maintain intensity without signs/symptoms of physical distress.  Continue to progress workloads to maintain intensity without signs/symptoms of physical distress.  -  Continue to progress workloads to maintain intensity without signs/symptoms of physical distress.    Average METs  2.4  2.93  -  3.17      Resistance Training  Training Prescription  Yes  Yes  -  Yes    Weight  3 lbs  4 lbs  -  4 lbs    Reps  10-15  10-15  -  10-15      Interval Training   Interval Training  -  No  -  No      Treadmill   MPH  -  2.5  -  2.5    Grade  -  1  -  3    Minutes  -  15  -  15    METs  -  3.26  -  3.95      Recumbant Bike   Level  5  5  -  6    Watts  35  25  -  37    Minutes  15  15  -  15    METs  3  2.72  -  2.9      T5 Nustep   Level  2  2  -  2    Minutes  15  15  -  15    METs  2.4  2.8  -  2.5      Home Exercise Plan   Plans to continue exercise at  -  -  Home (comment) walking, join [employer] (comment) walking, join [employer]  -  -  Add 3 additional days to program exercise sessions.  Add 3 additional days to program exercise sessions.    Initial Home Exercises Provided  -  -  06/09/18  06/09/18       Exercise Comments:  Exercise Comments    Row Name 05/19/18 0935           Exercise Comments  First full day of exercise!   Patient was oriented to gym and equipment including functions, settings, policies, and procedures.  Patient's individual exercise prescription and treatment plan were reviewed.  All starting workloads were established based on the results of the 6 minute walk test done at initial orientation visit.  The plan for exercise progression was also introduced and progression will be customized based on patient's performance and goals.          Exercise Goals and Review:  Exercise Goals    Row Name 05/12/18 1352             Exercise Goals   Increase Physical Activity  Yes       Intervention  Provide advice, education, support and counseling about physical activity/exercise needs.;Develop an individualized exercise prescription for aerobic and resistive training based on initial evaluation findings, risk stratification, comorbidities and participant's personal goals.       Expected Outcomes  Short Term: Attend rehab on a regular basis to increase amount of physical activity.;Long Term: Add in home exercise to make exercise part of routine and to increase amount of physical activity.;Long Term: Exercising regularly at least 3-5 days a week.       Increase Strength and Stamina  Yes       Intervention  Provide advice, education, support and counseling about physical activity/exercise needs.;Develop an individualized exercise prescription for aerobic and resistive training based on initial evaluation findings, risk stratification, comorbidities and participant's personal goals.       Expected Outcomes  Short Term: Increase workloads from initial exercise prescription for resistance, speed, and METs.;Short Term: Perform resistance training exercises routinely during rehab and add in resistance training at home;Long  Term: Improve cardiorespiratory fitness, muscular endurance and strength as measured by increased METs and functional capacity (6MWT)       Able to understand and use rate of perceived exertion  (RPE) scale  Yes       Intervention  Provide education and explanation on how to use RPE scale       Expected Outcomes  Short Term: Able to use RPE daily in rehab to express subjective intensity level;Long Term:  Able to use RPE to guide intensity level when exercising independently       Able to understand and use Dyspnea scale  Yes       Intervention  Provide education and explanation on how to use Dyspnea scale       Expected Outcomes  Short Term: Able to use Dyspnea scale daily in rehab to express subjective sense of shortness of breath during exertion;Long Term: Able to use Dyspnea scale to guide intensity level when exercising independently       Knowledge and understanding of Target Heart Rate Range (THRR)  Yes       Intervention  Provide education and explanation of THRR including how the numbers were predicted and where they are located for reference       Expected Outcomes  Short Term: Able to state/look up THRR;Long Term: Able to use THRR to govern intensity when exercising independently;Short Term: Able to use daily as guideline for intensity in rehab       Able to check pulse independently  Yes       Intervention  Provide education and demonstration on how to check pulse in carotid and radial arteries.;Review the importance of being able to check your own pulse for safety during independent exercise       Expected Outcomes  Short Term: Able to explain why pulse checking is important during independent exercise;Long Term: Able to check pulse independently and accurately       Understanding of Exercise Prescription  Yes       Intervention  Provide education, explanation, and written materials on patient's individual exercise prescription       Expected Outcomes  Short Term: Able to explain program exercise prescription;Long Term: Able to explain home exercise prescription to exercise independently          Exercise Goals Re-Evaluation : Exercise Goals Re-Evaluation    Row Name 05/19/18  0935 05/24/18 1220 06/07/18 1004 06/09/18 0946 06/22/18 0950     Exercise Goal Re-Evaluation   Exercise Goals Review  Increase Physical Activity;Increase Strength and Stamina;Able to understand and use rate of perceived exertion (RPE) scale;Understanding of Exercise Prescription  Increase Physical Activity;Increase Strength and Stamina;Able to understand and use rate of perceived exertion (RPE) scale;Understanding of Exercise Prescription  Increase Physical Activity;Increase Strength and Stamina;Understanding of Exercise Prescription  Increase Physical Activity;Increase Strength and Stamina;Understanding of Exercise Prescription;Able to understand and use rate of perceived exertion (RPE) scale;Knowledge and understanding of Target Heart Rate Range (THRR);Able to check pulse independently  Increase Physical Activity;Increase Strength and Stamina;Understanding of Exercise Prescription   Comments  Reviewed RPE scale, THR and program prescription with pt today.  Pt voiced understanding and was given a copy of goals to take home.   [name] is doing well in rehab so far. He is tolerating exercise well. He is on level 5 on the recumbant bike and level 2 on the nu step. We will continue to monitor progess.   [name] is doing well in rehab.  He is exercising 6  days a week.  He is walking for 35 min at home and using exercise bands as well.  He is getting his strength and stamina back on his good days, but when his myesthina flares up.  His myesthina effects his diaphragm making it hard for him to breathe.   Reviewed home exercise with pt today.  Pt plans to continue walking for exercise.  He and his wife are also going to join the [facility].  Reviewed THR, pulse, RPE, sign and symptoms, NTG use, and when to call 911 or MD.  Also discussed weather considerations and indoor options.  Pt voiced understanding.  [name] continues to do well in rehab.  He is at 37 watts on the recumbent bike.  We will continue to monitor his progression.     Expected Outcomes  Short: Use RPE daily to regulate intensity. Long: Follow program prescription in THR.  Short: increase strength and stamina Long: increase overall MET levels   Short: Continue to exercise on off days.  Long: Continue to increase strength and stamina.   Short: Continue to exercise on off days.  Long: Continue to increase strength and stamina.   Short: Continue to exercise at [employer] on off days.  Long: Continue to exercise indpendently.    [place] Name 06/23/18 1000 07/05/18 1516           Exercise Goal Re-Evaluation   Exercise Goals Review  Increase Physical Activity;Increase Strength and Stamina;Understanding of Exercise Prescription  -      Comments  [name] has been doing well in rehab.  He is doing well on his home exercise. He is walking usually 3-4 days a week and doing his bands.   He feels the bands are getting easy so we talked about doubling up his band and increasing his sets.    Out since last review      Expected Outcomes  Short: Double up band for more resistance.  Long: Continue to exercise on his own.   -         Discharge Exercise Prescription (Final Exercise Prescription Changes): Exercise Prescription Changes - 06/22/18 0900      Response to Exercise   Blood Pressure (Admit)  110/68    Blood Pressure (Exercise)  128/54    Blood Pressure (Exit)  128/64    Heart Rate (Admit)  72 bpm    Heart Rate (Exercise)  120 bpm    Heart Rate (Exit)  104 bpm    Rating of Perceived Exertion (Exercise)  13    Symptoms  none    Duration  Continue with 30 min of aerobic exercise without signs/symptoms of physical distress.    Intensity  THRR unchanged      Progression   Progression  Continue to progress workloads to maintain intensity without signs/symptoms of physical distress.    Average METs  3.17      Resistance Training   Training Prescription  Yes    Weight  4 lbs    Reps  10-15      Interval Training   Interval Training  No      Treadmill   MPH  2.5    Grade   3    Minutes  15    METs  3.95      Recumbant Bike   Level  6    Watts  37    Minutes  15    METs  2.9      T5 Nustep   Level  2    Minutes  15    METs  2.5      Home Exercise Plan   Plans to continue exercise at  Home (comment)   walking, join [employer]   Frequency  Add 3 additional days to program exercise sessions.    Initial Home Exercises Provided  06/09/18       Nutrition:  Target Goals: Understanding of nutrition guidelines, daily intake of sodium '1500mg'$ , cholesterol '200mg'$ , calories 30% from fat and 7% or less from saturated fats, daily to have 5 or more servings of fruits and vegetables.  Biometrics: Pre Biometrics - 05/12/18 1351      Pre Biometrics   Height  '5\' 8"'$  (1.727 m)    Weight  232 lb 3.2 oz (105.3 kg)    Waist Circumference  47 inches    Hip Circumference  45 inches    Waist to Hip Ratio  1.04 %    BMI (Calculated)  35.31    Single Leg Stand  1 seconds   neuropathy       Nutrition Therapy Plan and Nutrition Goals: Nutrition Therapy & Goals - 05/31/18 1029      Nutrition Therapy   Diet  DM    Protein (specify units)  12-13    Fiber  35 grams    Whole Grain Foods  3 servings   chooses whole grains regularly   Saturated Fats  15 max. grams    Fruits and Vegetables  5 servings/day   8 ideal; trying to eat more vegetables, does not always eat 3 meals per day   Sodium  1500 grams      Personal Nutrition Goals   Nutrition Goal  Consume a protein source with meals and snacks, at least 1oz/7g, along with at least 1 serving (15g) of carbohydrate to help better regulate blood sugars    Personal Goal #2  Include more sources of non-starchy vegetables daily. This also helps to increase daily fiber intake to aid in short term BG control    Personal Goal #3  Eat a mid day meal/ snack more consistently. Try not to go more than 4-5 hours without eating. You may also want to try a snack '@HS'$  to prevent low blood sugar readings upon waking in the morning     Comments  He has had diabetic education in the past and practices carb counting, but does not eat on a regular schedule most days and does not always practice eating balanced meals. He is limited to 64oz of fluid per day d/t CHF and c/o struggling to eat enough vegetables d/t disliking several varieties. He plans to try oatmeal for breakfast this week as a new heart-healthy option, as he tends to want to choose high-sugar cereals like [employer]uffs      Intervention Plan   Intervention  Prescribe, educate and counsel regarding individualized specific dietary modifications aiming towards targeted core components such as weight, hypertension, lipid management, diabetes, heart failure and other comorbidities.    Expected Outcomes  Short Term Goal: Understand basic principles of dietary content, such as calories, fat, sodium, cholesterol and nutrients.;Short Term Goal: A plan has been developed with personal nutrition goals set during dietitian appointment.;Long Term Goal: Adherence to prescribed nutrition plan.       Nutrition Assessments: Nutrition Assessments - 05/12/18 1221      MEDFICTS Scores   Pre Score  55       Nutrition Goals Re-Evaluation: Nutrition Goals Re-Evaluation    Row  Name 05/31/18 1039 06/07/18 1009 06/23/18 1200         Goals   Nutrition Goal  Eat a mid day meal/ snack more consistently. Try not to go more than 4-5 hours without eating. You may also want to try a snack '@HS'$  to prevent low blood sugar readings upon waking in the morning  Mid day meal/snack, non starch vegetables, more fiber, more protein  Eat a mid-day meal or snack more consistently to help better manage blood glucose levels, include more sources of non-starchy vegetables, and consume a protein source with meals and snacks     Comment  He is not consistent about eating a meal mid day. He typically eats breakfast and dinner and does not usually eat an evening (HS) snack  [name] is eating that mid day meal/snack more  consistently and has found that it is keeping his blood sugars more even throughout the day.   He has also branched out to non-starchy vegetables including cauliflower. [name] has added in more protein and using protein shakes for his extra meal.    He has been eating a snack mid-morning which he states has made a noticeable positive change in his BG levels. He has incorporated oatmeal as a breakfast option and has been experimenting with different non-starchy vegetables (green beans and carrots last week, brocoli and cauliflower this week)     Expected Outcome  He will be consistent about eating a small meal/ snack mid day to help improve blood glucose control over the course of the day. This meal will include at least 1 serving of protein + at least 1 serving of complex carbohydrate  Short: Stay consistent with three meals a day.  Long: Continue to add in the newer fooods.   He will continue to implement a snack between breakfast and lunch to help improve BG control over time. He will also continue to include a variety of different vegetables in his diet, along with other fiber sources like oatmeal       Personal Goal #2 Re-Evaluation   Personal Goal #2  Include more sources of non-starchy vegetables daily. This also helps to increase daily fiber intake to aid in short-term BG control  -  -       Personal Goal #3 Re-Evaluation   Personal Goal #3  Consume a protein source with meals and snacks, at least 1oz/7g, along with at least 1 serving (15g) of carbohydrate to help better regulate blood sugars  -  -        Nutrition Goals Discharge (Final Nutrition Goals Re-Evaluation): Nutrition Goals Re-Evaluation - 06/23/18 1200      Goals   Nutrition Goal  Eat a mid-day meal or snack more consistently to help better manage blood glucose levels, include more sources of non-starchy vegetables, and consume a protein source with meals and snacks    Comment  He has been eating a snack mid-morning which he states  has made a noticeable positive change in his BG levels. He has incorporated oatmeal as a breakfast option and has been experimenting with different non-starchy vegetables (green beans and carrots last week, brocoli and cauliflower this week)    Expected Outcome  He will continue to implement a snack between breakfast and lunch to help improve BG control over time. He will also continue to include a variety of different vegetables in his diet, along with other fiber sources like oatmeal       Psychosocial: Target Goals: Acknowledge presence or  absence of significant depression and/or stress, maximize coping skills, provide positive support system. Participant is able to verbalize types and ability to use techniques and skills needed for reducing stress and depression.   Initial Review & Psychosocial Screening: Initial Psych Review & Screening - 05/12/18 1216      Initial Review   Current issues with  Current Stress Concerns    Source of Stress Concerns  Chronic Illness;Unable to perform yard/household activities;Family    Comments  Household has changed: children moved back home. Good and bad stress with his. Son is helpful; new chaos in house.       Family Dynamics   Good Support System?  Yes   Wife, son,grandson, family friends     Barriers   Psychosocial barriers to participate in program  There are no identifiable barriers or psychosocial needs.;The patient should benefit from training in stress management and relaxation.      Screening Interventions   Interventions  Encouraged to exercise;To provide support and resources with identified psychosocial needs;Provide feedback about the scores to participant    Expected Outcomes  Short Term goal: Utilizing psychosocial counselor, staff and physician to assist with identification of specific Stressors or current issues interfering with healing process. Setting desired goal for each stressor or current issue identified.;Long Term Goal:  Stressors or current issues are controlled or eliminated.;Short Term goal: Identification and review with participant of any Quality of Life or Depression concerns found by scoring the questionnaire.;Long Term goal: The participant improves quality of Life and PHQ9 Scores as seen by post scores and/or verbalization of changes       Quality of Life Scores:  Quality of Life - 05/12/18 1217      Quality of Life   Select  Quality of Life      Quality of Life Scores   Health/Function Pre  18.53 %    Socioeconomic Pre  26.5 %    Psych/Spiritual Pre  28.58 %    Family Pre  24.4 %    GLOBAL Pre  22.83 %      Scores of 19 and below usually indicate a poorer quality of life in these areas.  A difference of  2-3 points is a clinically meaningful difference.  A difference of 2-3 points in the total score of the Quality of Life Index has been associated with significant improvement in overall quality of life, self-image, physical symptoms, and general health in studies assessing change in quality of life.  PHQ-9: Recent Review Flowsheet Data    Depression screen [facility] 2/9 07/07/2018 06/21/2018 06/10/2018 05/12/2018 05/06/2018   Decreased Interest 0 0 0 - 0   Down, Depressed, Hopeless 0 1 0 - 0   PHQ - 2 Score 0 1 0 - 0   Altered sleeping - - 0 1 -   Tired, decreased energy - - 0 2 -   Change in appetite - - 0 0 -   Feeling bad or failure about yourself  - - 0 0 -   Trouble concentrating - - 0 0 -   Moving slowly or fidgety/restless - - 0 0 -   Suicidal thoughts - - 0 0 -   PHQ-9 Score - - 0 - -   Difficult doing work/chores - - Not difficult at all Not difficult at all -     Interpretation of Total Score  Total Score Depression Severity:  1-4 = Minimal depression, 5-9 = Mild depression, 10-14 = Moderate depression, 15-19 = Moderately  severe depression, 20-27 = Severe depression   Psychosocial Evaluation and Intervention: Psychosocial Evaluation - 06/16/18 1047      Psychosocial Evaluation &  Interventions   Interventions  Therapist referral;Stress management education;Encouraged to exercise with the program and follow exercise prescription    Comments  Counselor spoke with Mr. Cast ([name]) by phone on 9/4 for initial psychosocial evaluation - per his request.  He is a 57 year old who has had multiple stents and diagnosed with CHF.  He has a strong support system with a spouse of 42 years; a son and his girlfriend who live in the home; some good neighbors and family who live out of state.  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  He sleeps "okay" and has a decent appetite.  He reports not history of depression but is experiencing some current symptoms of depression and anxiety and would like to see someone.  His primary stressors are his health; his spouse's health and some negative family conflict and dynamics currently.  His goals for this program are to exercise routinely and feel more energetic.  counselor provided him with a therapist's contact infor locally for him to consider.  Staff will follow.    Expected Outcomes  Short:  [name] will contact therapist for emotional support currently with all his stress.  He will exercise for his health and mental health.  Long:  [name] will continue to practice positive self-care strategies for his health and mental health to manage stress.    Continue Psychosocial Services   Follow up required by staff       Psychosocial Re-Evaluation: Psychosocial Re-Evaluation    [place] Name 06/07/18 1008 06/23/18 1002           Psychosocial Re-Evaluation   Current issues with  Current Stress Concerns  Current Stress Concerns;Current Sleep Concerns      Comments  [name]'s biggest problem is his stress levels at home and he is scheduled to meet with Juliann Pulse to discuss.   [name] has set up an appointment to meet couselor starting next Monday.  Having a plan has  him feeling better overall.  His wife is a Education officer, museum and now they are able to talk more.  His is getting about 6 1/2 hours a sleep a night, not bad but not great either.  He is looking forward to meeting with the counselor.  He was in a perfect storm with everything but has found coming to class to be very helpful.  He is enjoying being pushed for exercise and other changes.       Expected Outcomes  Short: Meet with conselor.  Long: Continue to work on recommendations.   Short: Go to conselor appointment.  Long: Continue to work on new Radiographer, therapeutic.       Interventions  Stress management education;Encouraged to attend Cardiac Rehabilitation for the exercise  Stress management education;Encouraged to attend Cardiac Rehabilitation for the exercise      Continue Psychosocial Services   Follow up required by counselor  Follow up required by staff      Comments  -  Household has changed: children moved back home. Good and bad stress with his. Son is helpful; new chaos in house.         Initial Review   Source of Stress Concerns  -  Chronic Illness;Unable to perform yard/household activities;Family  Psychosocial Discharge (Final Psychosocial Re-Evaluation): Psychosocial Re-Evaluation - 06/23/18 1002      Psychosocial Re-Evaluation   Current issues with  Current Stress Concerns;Current Sleep Concerns    Comments  [name] has set up an appointment to meet couselor starting next Monday.  Having a plan has him feeling better overall.  His wife is a Education officer, museum and now they are able to talk more.  His is getting about 6 1/2 hours a sleep a night, not bad but not great either.  He is looking forward to meeting with the counselor.  He was in a perfect storm with everything but has found coming to class to be very helpful.  He is enjoying being pushed for exercise and other changes.     Expected Outcomes  Short: Go to conselor appointment.  Long: Continue to work on new Radiographer, therapeutic.     Interventions   Stress management education;Encouraged to attend Cardiac Rehabilitation for the exercise    Continue Psychosocial Services   Follow up required by staff    Comments  Household has changed: children moved back home. Good and bad stress with his. Son is helpful; new chaos in house.       Initial Review   Source of Stress Concerns  Chronic Illness;Unable to perform yard/household activities;Family       Vocational Rehabilitation: Provide vocational rehab assistance to qualifying candidates.   Vocational Rehab Evaluation & Intervention: Vocational Rehab - 05/12/18 1222      Initial Vocational Rehab Evaluation & Intervention   Assessment shows need for Vocational Rehabilitation  No       Education: Education Goals: Education classes will be provided on a variety of topics geared toward better understanding of heart health and risk factor modification. Participant will state understanding/return demonstration of topics presented as noted by education test scores.  Learning Barriers/Preferences: Learning Barriers/Preferences - 05/12/18 1221      Learning Barriers/Preferences   Learning Barriers  None    Learning Preferences  None       Education Topics:  AED/CPR: - Group verbal and written instruction with the use of models to demonstrate the basic use of the AED with the basic ABC's of resuscitation.   Cardiac Rehab from 06/23/2018 in [facility] Cardiac and Pulmonary Rehab  Date  06/21/18  Educator  KS  Instruction Review Code  1- Verbalizes Understanding      General Nutrition Guidelines/Fats and Fiber: -Group instruction provided by verbal, written material, models and posters to present the general guidelines for heart healthy nutrition. Gives an explanation and review of dietary fats and fiber.   Controlling Sodium/Reading Food Labels: -Group verbal and written material supporting the discussion of sodium use in heart healthy nutrition. Review and explanation with models,  verbal and written materials for utilization of the food label.   Cardiac Rehab from 06/23/2018 in [facility] Cardiac and Pulmonary Rehab  Date  06/23/18  Educator  LB  Instruction Review Code  1- Verbalizes Understanding      Exercise Physiology & General Exercise Guidelines: - Group verbal and written instruction with models to review the exercise physiology of the cardiovascular system and associated critical values. Provides general exercise guidelines with specific guidelines to those with heart or lung disease.    Aerobic Exercise & Resistance Training: - Gives group verbal and written instruction on the various components of exercise. Focuses on aerobic and resistive training programs and the benefits of this training and how to safely progress through these programs.[name]  Flexibility, Balance, Mind/Body Relaxation: Provides group verbal/written instruction on the benefits of flexibility and balance training, including mind/body exercise modes such as yoga, pilates and tai chi.  Demonstration and skill practice provided.   Stress and Anxiety: - Provides group verbal and written instruction about the health risks of elevated stress and causes of high stress.  Discuss the correlation between heart/lung disease and anxiety and treatment options. Review healthy ways to manage with stress and anxiety.   Cardiac Rehab from 06/23/2018 in [facility] Cardiac and Pulmonary Rehab  Date  05/24/18  Educator  [facility]  Instruction Review Code  5- Refused Teaching [patient had yesterday]      Depression: - Provides group verbal and written instruction on the correlation between heart/lung disease and depressed mood, treatment options, and the stigmas associated with seeking treatment.   Anatomy & Physiology of the Heart: - Group verbal and written instruction and models provide basic cardiac anatomy and physiology, with the coronary electrical and arterial systems. Review of Valvular disease and Heart  Failure   Cardiac Procedures: - Group verbal and written instruction to review commonly prescribed medications for heart disease. Reviews the medication, class of the drug, and side effects. Includes the steps to properly store meds and maintain the prescription regimen. (beta blockers and nitrates)   Cardiac Rehab from 06/23/2018 in [facility] Cardiac and Pulmonary Rehab  Date  06/16/18  Educator  CE  Instruction Review Code  1- Verbalizes Understanding      Cardiac Medications I: - Group verbal and written instruction to review commonly prescribed medications for heart disease. Reviews the medication, class of the drug, and side effects. Includes the steps to properly store meds and maintain the prescription regimen.   Cardiac Rehab from 06/23/2018 in [facility] Cardiac and Pulmonary Rehab  Date  05/31/18  Educator  SB  Instruction Review Code  1- Verbalizes Understanding      Cardiac Medications II: -Group verbal and written instruction to review commonly prescribed medications for heart disease. Reviews the medication, class of the drug, and side effects. (all other drug classes)   Cardiac Rehab from 06/23/2018 in [facility] Cardiac and Pulmonary Rehab  Date  05/19/18  Educator  CE  Instruction Review Code  1- Verbalizes Understanding       Go Sex-Intimacy & Heart Disease, Get SMART - Goal Setting: - Group verbal and written instruction through game format to discuss heart disease and the return to sexual intimacy. Provides group verbal and written material to discuss and apply goal setting through the application of the S.M.A.R.T. Method.   Cardiac Rehab from 06/23/2018 in [facility] Cardiac and Pulmonary Rehab  Date  06/16/18  Educator  CE  Instruction Review Code  1- Verbalizes Understanding      Other Matters of the Heart: - Provides group verbal, written materials and models to describe Stable Angina and Peripheral Artery. Includes description of the disease process and treatment options  available to the cardiac patient.   Exercise & Equipment Safety: - Individual verbal instruction and demonstration of equipment use and safety with use of the equipment.   Cardiac Rehab from 06/23/2018 in [facility] Cardiac and Pulmonary Rehab  Date  05/12/18  Educator  SB  Instruction Review Code  1- Verbalizes Understanding      Infection Prevention: - Provides verbal and written material to individual with discussion of infection control including proper hand washing and proper equipment cleaning during exercise session.   Cardiac Rehab from 06/23/2018 in [facility] Cardiac and Pulmonary Rehab  Date  05/12/18  Educator  Sb  Instruction Review Code  1- Verbalizes Understanding      Falls Prevention: - Provides verbal and written material to individual with discussion of falls prevention and safety.   Cardiac Rehab from 06/23/2018 in [facility] Cardiac and Pulmonary Rehab  Date  05/12/18  Educator  SB  Instruction Review Code  1- Verbalizes Understanding      Diabetes: - Individual verbal and written instruction to review signs/symptoms of diabetes, desired ranges of glucose level fasting, after meals and with exercise. Acknowledge that pre and post exercise glucose checks will be done for 3 sessions at entry of program.   Cardiac Rehab from 06/23/2018 in [facility] Cardiac and Pulmonary Rehab  Date  05/12/18  Educator  SB  Instruction Review Code  1- Verbalizes Understanding      Know Your Numbers and Risk Factors: -Group verbal and written instruction about important numbers in your health.  Discussion of what are risk factors and how they play a role in the disease process.  Review of Cholesterol, Blood Pressure, Diabetes, and BMI and the role they play in your overall health.   Cardiac Rehab from 06/23/2018 in [facility] Cardiac and Pulmonary Rehab  Date  05/19/18  Educator  CE  Instruction Review Code  1- Verbalizes Understanding      Sleep Hygiene: -Provides group verbal and written instruction  about how sleep can affect your health.  Define sleep hygiene, discuss sleep cycles and impact of sleep habits. Review good sleep hygiene tips.    Cardiac Rehab from 06/23/2018 in [facility] Cardiac and Pulmonary Rehab  Date  06/07/18  Educator  [facility]  Instruction Review Code  1- Verbalizes Understanding      Other: -Provides group and verbal instruction on various topics (see comments)   Knowledge Questionnaire Score: Knowledge Questionnaire Score - 05/12/18 1221      Knowledge Questionnaire Score   Pre Score  25/26   correct response reviewed with [name]. He vebalized understanding. No further questions today      Core Components/Risk Factors/Patient Goals at Admission: Personal Goals and Risk Factors at Admission - 05/12/18 1222      Core Components/Risk Factors/Patient Goals on Admission    Weight Management  Obesity;Weight Loss;Yes    Intervention  Weight Management: Develop a combined nutrition and exercise program designed to reach desired caloric intake, while maintaining appropriate intake of nutrient and fiber, sodium and fats, and appropriate energy expenditure required for the weight goal.    Admit Weight  232 lb 3.2 oz (105.3 kg)    Goal Weight: Short Term  230 lb (104.3 kg)    Goal Weight: Long Term  190 lb (86.2 kg)    Expected Outcomes  Short Term: Continue to assess and modify interventions until short term weight is achieved;Long Term: Adherence to nutrition and physical activity/exercise program aimed toward attainment of established weight goal;Weight Loss: Understanding of general recommendations for a balanced deficit meal plan, which promotes 1-2 lb weight loss per week and includes a negative energy balance of [id number] kcal/d    Diabetes  Yes    Intervention  Provide education about signs/symptoms and action to take for hypo/hyperglycemia.;Provide education about proper nutrition, including hydration, and aerobic/resistive exercise prescription along with prescribed  medications to achieve blood glucose in normal ranges: Fasting glucose 65-99 mg/dL    Expected Outcomes  Short Term: Participant verbalizes understanding of the signs/symptoms and immediate care of hyper/hypoglycemia, proper foot care and importance of medication, aerobic/resistive exercise  and nutrition plan for blood glucose control.;Long Term: Attainment of HbA1C < 7%.    Heart Failure  Yes    Intervention  Provide a combined exercise and nutrition program that is supplemented with education, support and counseling about heart failure. Directed toward relieving symptoms such as shortness of breath, decreased exercise tolerance, and extremity edema.    Expected Outcomes  Improve functional capacity of life;Short term: Attendance in program 2-3 days a week with increased exercise capacity. Reported lower sodium intake. Reported increased fruit and vegetable intake. Reports medication compliance.;Short term: Daily weights obtained and reported for increase. Utilizing diuretic protocols set by physician.;Long term: Adoption of self-care skills and reduction of barriers for early signs and symptoms recognition and intervention leading to self-care maintenance.    Hypertension  Yes    Intervention  Provide education on lifestyle modifcations including regular physical activity/exercise, weight management, moderate sodium restriction and increased consumption of fresh fruit, vegetables, and low fat dairy, alcohol moderation, and smoking cessation.;Monitor prescription use compliance.    Expected Outcomes  Short Term: Continued assessment and intervention until BP is < 140/16m HG in hypertensive participants. < 130/873mHG in hypertensive participants with diabetes, heart failure or chronic kidney disease.;Long Term: Maintenance of blood pressure at goal levels.    Lipids  Yes    Intervention  Provide education and support for participant on nutrition & aerobic/resistive exercise along with prescribed  medications to achieve LDL '70mg'$ , HDL >'40mg'$ .    Expected Outcomes  Short Term: Participant states understanding of desired cholesterol values and is compliant with medications prescribed. Participant is following exercise prescription and nutrition guidelines.;Long Term: Cholesterol controlled with medications as prescribed, with individualized exercise RX and with personalized nutrition plan. Value goals: LDL < '70mg'$ , HDL > 40 mg.       Core Components/Risk Factors/Patient Goals Review:  Goals and Risk Factor Review    Row Name 06/07/18 1013 06/23/18 0956           Core Components/Risk Factors/Patient Goals Review   Personal Goals Review  Weight Management/Obesity;Diabetes;Hypertension;Heart Failure;Lipids  Weight Management/Obesity;Diabetes;Hypertension;Heart Failure;Lipids      Review  TiOctavia Bruckneras been working on his blood sugars with a better diet and chaning his timing of his insulin.  He is now taking his insulin in the morning and after exercise on days he comes to rehab.   Overall, his weight is down three pounds since he started rehab.   He has not had any heart failure symptoms, but does have some occasionaly chest pain which is similar to his pattern post stent previously.  Blood pressure have been between 110-120/60-70 and check those three times a day at home.  He is doing well on his medications and since the lasix was adjusted he is feeling better overall.   TiOctavia Bruckneras been changing his medications to get better regulated.  He has had his Lasix d/c'd  and thus his weight has been going up.  His family is very supportive of his changes and they are all working together on his diet.  His blood surgars have been around 125 in the morning and the highest has been 150.  He has had more issues with his sugars dropping and has already talked to his endocrinologist about it.  [name]'s blood pressures are staying in the 120s for the most part and he continues to check them daily at home.  He has been having  some swelling in his hands since stopping the Lasix and plans to call Dr.  Arida when he gets home today.       Expected Outcomes  Short: Continue to work on weight loss and diabetes.  Long: Continue to monitor risk factors.   Short: Continue to work on weight loss.  Long: Continue to monitor heart failure and managing symptoms.          Core Components/Risk Factors/Patient Goals at Discharge (Final Review):  Goals and Risk Factor Review - 06/23/18 0956      Core Components/Risk Factors/Patient Goals Review   Personal Goals Review  Weight Management/Obesity;Diabetes;Hypertension;Heart Failure;Lipids    Review  [name] has been changing his medications to get better regulated.  He has had his Lasix d/c'd  and thus his weight has been going up.  His family is very supportive of his changes and they are all working together on his diet.  His blood surgars have been around 125 in the morning and the highest has been 150.  He has had more issues with his sugars dropping and has already talked to his endocrinologist about it.  [name]'s blood pressures are staying in the 120s for the most part and he continues to check them daily at home.  He has been having some swelling in his hands since stopping the Lasix and plans to call Dr. Fletcher Anon when he gets home today.     Expected Outcomes  Short: Continue to work on weight loss.  Long: Continue to monitor heart failure and managing symptoms.        ITP Comments: ITP Comments    Row Name 05/12/18 1225 05/18/18 0849 06/15/18 0607 07/05/18 1516 07/12/18 1520   ITP Comments  Medical review completed today. INitial ITP sent to Dr Caryn Section to review, change as needed and to sign. Diagnosis documentation can be found in [facility] 04/25/2018 Encounter  30 day review completed. ITP sent to Dr. Ramonita Lab, covering for Dr. Emily Filbert, Medical Director of Cardiac Rehab. Continue with ITP unless changes are made by physician. New to program. Has yet to start exercise.   30 day review  completed. ITP sent to Dr. Emily Filbert, Medical Director of Cardiac Rehab. Continue with ITP unless changes are made by physician  Called to check on pt.  Last attended on 9/19.  Left message on mobile voicemail.  [name] called to let us know that he was in the hospital for sepsis, hypoxia, and new NSTEMI.  He would like to return to class once cleared and told him that he will need a note to return.     [place] Name 07/13/18 0631           ITP Comments  30 day review.  Continue with ITP unless directed changes per Medical Director review.          Comments:

## 2018-07-13 NOTE — Progress Notes (Signed)
Advanced care plan. Purpose of the Encounter: CODE STATUS Parties in Attendance:Patient Patient's Decision Capacity:Good Subjective/Patient's story: Presented initially for fever, confusion and tachycardia Objective/Medical story Patient had severe colitis and non stemi Needed iv antibiotics, iv fluids and medical management for cardiac disease Goals of care determination:  Advance care directives and goals of care discussed during hospitalization. Patient wants everything done which includes cpr, intubation and ventilator if need arises. CODE STATUS: Full code Time spent discussing advanced care planning: 16 minutes

## 2018-07-13 NOTE — Care Management (Signed)
[facility] is aware of patient discharge today for home health RN, PT, OT.

## 2018-07-13 NOTE — Progress Notes (Signed)
IVs and tele removed from patient. Discharge instructions given to patient along with hard copy prescriptions. Verbalized understanding. No acute distress at this time. Wife and son at bedside to transport patient home.

## 2018-07-13 NOTE — Plan of Care (Signed)
  Problem: Activity: Goal: Risk for activity intolerance will decrease Outcome: Progressing Note:  Up to bathroom with 1 assist, tolerating well   Problem: Coping: Goal: Level of anxiety will decrease Outcome: Progressing   Problem: Elimination: Goal: Will not experience complications related to bowel motility Outcome: Progressing Note:  Pt had BM this shift Goal: Will not experience complications related to urinary retention Outcome: Progressing   Problem: Pain Managment: Goal: General experience of comfort will improve Outcome: Progressing Note:  Pt continues to have chronic back pain, being treated with oxycodone, treated pt twice this shift, which brought 7 out of 10 pain down to a  3 out of 10 pain, which is tolerable for pt   Problem: Safety: Goal: Ability to remain free from injury will improve Outcome: Progressing   Problem: Skin Integrity: Goal: Risk for impaired skin integrity will decrease Outcome: Progressing   Problem: Education: Goal: Knowledge of General Education information will improve Description Including pain rating scale, medication(s)/side effects and non-pharmacologic comfort measures Outcome: Completed/Met

## 2018-07-13 NOTE — Care Management Important Message (Signed)
Copy of signed IM left with patient in room.  

## 2018-07-14 ENCOUNTER — Telehealth: Payer: Self-pay

## 2018-07-14 DIAGNOSIS — J4540 Moderate persistent asthma, uncomplicated: Secondary | ICD-10-CM | POA: Diagnosis not present

## 2018-07-14 DIAGNOSIS — M4697 Unspecified inflammatory spondylopathy, lumbosacral region: Secondary | ICD-10-CM | POA: Diagnosis not present

## 2018-07-14 DIAGNOSIS — A419 Sepsis, unspecified organism: Secondary | ICD-10-CM | POA: Diagnosis not present

## 2018-07-14 DIAGNOSIS — G7000 Myasthenia gravis without (acute) exacerbation: Secondary | ICD-10-CM | POA: Diagnosis not present

## 2018-07-14 DIAGNOSIS — I2510 Atherosclerotic heart disease of native coronary artery without angina pectoris: Secondary | ICD-10-CM | POA: Diagnosis not present

## 2018-07-14 DIAGNOSIS — E785 Hyperlipidemia, unspecified: Secondary | ICD-10-CM | POA: Diagnosis not present

## 2018-07-14 DIAGNOSIS — G4733 Obstructive sleep apnea (adult) (pediatric): Secondary | ICD-10-CM | POA: Diagnosis not present

## 2018-07-14 DIAGNOSIS — Z794 Long term (current) use of insulin: Secondary | ICD-10-CM | POA: Diagnosis not present

## 2018-07-14 DIAGNOSIS — N189 Chronic kidney disease, unspecified: Secondary | ICD-10-CM | POA: Diagnosis not present

## 2018-07-14 DIAGNOSIS — R6520 Severe sepsis without septic shock: Secondary | ICD-10-CM | POA: Diagnosis not present

## 2018-07-14 DIAGNOSIS — A6923 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: Secondary | ICD-10-CM | POA: Diagnosis not present

## 2018-07-14 DIAGNOSIS — E1122 Type 2 diabetes mellitus with diabetic chronic kidney disease: Secondary | ICD-10-CM | POA: Diagnosis not present

## 2018-07-14 DIAGNOSIS — E1142 Type 2 diabetes mellitus with diabetic polyneuropathy: Secondary | ICD-10-CM | POA: Diagnosis not present

## 2018-07-14 DIAGNOSIS — I130 Hypertensive heart and chronic kidney disease with heart failure and stage 1 through stage 4 chronic kidney disease, or unspecified chronic kidney disease: Secondary | ICD-10-CM | POA: Diagnosis not present

## 2018-07-14 DIAGNOSIS — I5042 Chronic combined systolic (congestive) and diastolic (congestive) heart failure: Secondary | ICD-10-CM | POA: Diagnosis not present

## 2018-07-14 DIAGNOSIS — K219 Gastro-esophageal reflux disease without esophagitis: Secondary | ICD-10-CM | POA: Diagnosis not present

## 2018-07-14 NOTE — Telephone Encounter (Signed)
Transition Care Management Follow-up Telephone Call  Date of discharge and from where: 07/13/2018 from [facility]  How have you been since you were released from the hospital? "I am feeling better, no more nausea. I am just trying to take it slow"  Any questions or concerns? no  Items Reviewed:  Did the pt receive and understand the discharge instructions provided? Yes   Medications obtained and verified? Yes   Any new allergies since your discharge? No   Dietary orders reviewed? Yes  Do you have support at home? Yes   Functional Questionnaire: (I = Independent and D = Dependent) ADLs:   Bathing/Dressing- I  Meal Prep- I  Eating- I  Maintaining continence- I  Transferring/Ambulation- I  Managing Meds- I  Follow up appointments reviewed:   PCP Hospital f/u appt confirmed? Yes  Scheduled to see Dr.Sowles on 07/18/2018 @ 2:40pm.  [facility] f/u appt confirmed? Yes   Are transportation arrangements needed? No   If their condition worsens, is the pt aware to call PCP or go to the Emergency Dept.? Yes  Was the patient provided with contact information for the PCP's office or ED? Yes  Was to pt encouraged to call back with questions or concerns? Yes

## 2018-07-15 DIAGNOSIS — E1122 Type 2 diabetes mellitus with diabetic chronic kidney disease: Secondary | ICD-10-CM | POA: Diagnosis not present

## 2018-07-15 DIAGNOSIS — G4733 Obstructive sleep apnea (adult) (pediatric): Secondary | ICD-10-CM | POA: Diagnosis not present

## 2018-07-15 DIAGNOSIS — G7000 Myasthenia gravis without (acute) exacerbation: Secondary | ICD-10-CM | POA: Diagnosis not present

## 2018-07-15 DIAGNOSIS — I130 Hypertensive heart and chronic kidney disease with heart failure and stage 1 through stage 4 chronic kidney disease, or unspecified chronic kidney disease: Secondary | ICD-10-CM | POA: Diagnosis not present

## 2018-07-15 DIAGNOSIS — N189 Chronic kidney disease, unspecified: Secondary | ICD-10-CM | POA: Diagnosis not present

## 2018-07-15 DIAGNOSIS — I5042 Chronic combined systolic (congestive) and diastolic (congestive) heart failure: Secondary | ICD-10-CM | POA: Diagnosis not present

## 2018-07-15 DIAGNOSIS — I2510 Atherosclerotic heart disease of native coronary artery without angina pectoris: Secondary | ICD-10-CM | POA: Diagnosis not present

## 2018-07-15 DIAGNOSIS — A419 Sepsis, unspecified organism: Secondary | ICD-10-CM | POA: Diagnosis not present

## 2018-07-15 DIAGNOSIS — R6520 Severe sepsis without septic shock: Secondary | ICD-10-CM | POA: Diagnosis not present

## 2018-07-15 DIAGNOSIS — E785 Hyperlipidemia, unspecified: Secondary | ICD-10-CM | POA: Diagnosis not present

## 2018-07-15 DIAGNOSIS — K219 Gastro-esophageal reflux disease without esophagitis: Secondary | ICD-10-CM | POA: Diagnosis not present

## 2018-07-15 DIAGNOSIS — A6923 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: Secondary | ICD-10-CM | POA: Diagnosis not present

## 2018-07-15 DIAGNOSIS — J4540 Moderate persistent asthma, uncomplicated: Secondary | ICD-10-CM | POA: Diagnosis not present

## 2018-07-15 DIAGNOSIS — M4697 Unspecified inflammatory spondylopathy, lumbosacral region: Secondary | ICD-10-CM | POA: Diagnosis not present

## 2018-07-15 DIAGNOSIS — Z794 Long term (current) use of insulin: Secondary | ICD-10-CM | POA: Diagnosis not present

## 2018-07-15 DIAGNOSIS — E1142 Type 2 diabetes mellitus with diabetic polyneuropathy: Secondary | ICD-10-CM | POA: Diagnosis not present

## 2018-07-18 ENCOUNTER — Telehealth: Payer: Self-pay

## 2018-07-18 ENCOUNTER — Encounter: Payer: Self-pay | Admitting: *Deleted

## 2018-07-18 ENCOUNTER — Inpatient Hospital Stay: Payer: Self-pay | Admitting: Family Medicine

## 2018-07-18 NOTE — Telephone Encounter (Signed)
Can you check please?

## 2018-07-18 NOTE — Telephone Encounter (Signed)
Called [phone] @ 3:25 left voice message asking pt to return call. We are able to see him this Thursday @ 10:20

## 2018-07-18 NOTE — Telephone Encounter (Signed)
Copied from [place] [id number]. Topic: Quick Communication - Appointment Cancellation >> Jul 18, 2018  7:54 AM [name], NT wrote: Patient called to cancel appointment scheduled for 07/18/18. Patient has not rescheduled their appointment.  Patient wife called to cancel this appointment due to patients son not being able to bring him. She would like to reschedule for Thursday 07/21/18. There is nothing available with Dr. Ancil Boozer this week or next week for a hospital follow up. Please contact to reschedule.  Route to department's PEC pool. >> Jul 18, 2018  8:50 AM Karene Fry P wrote: Please advise as to where to put this patient.

## 2018-07-19 DIAGNOSIS — E785 Hyperlipidemia, unspecified: Secondary | ICD-10-CM | POA: Diagnosis not present

## 2018-07-19 DIAGNOSIS — E1142 Type 2 diabetes mellitus with diabetic polyneuropathy: Secondary | ICD-10-CM | POA: Diagnosis not present

## 2018-07-19 DIAGNOSIS — M4697 Unspecified inflammatory spondylopathy, lumbosacral region: Secondary | ICD-10-CM | POA: Diagnosis not present

## 2018-07-19 DIAGNOSIS — A6923 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: Secondary | ICD-10-CM | POA: Diagnosis not present

## 2018-07-19 DIAGNOSIS — I2510 Atherosclerotic heart disease of native coronary artery without angina pectoris: Secondary | ICD-10-CM | POA: Diagnosis not present

## 2018-07-19 DIAGNOSIS — G4733 Obstructive sleep apnea (adult) (pediatric): Secondary | ICD-10-CM | POA: Diagnosis not present

## 2018-07-19 DIAGNOSIS — I5042 Chronic combined systolic (congestive) and diastolic (congestive) heart failure: Secondary | ICD-10-CM | POA: Diagnosis not present

## 2018-07-19 DIAGNOSIS — E1122 Type 2 diabetes mellitus with diabetic chronic kidney disease: Secondary | ICD-10-CM | POA: Diagnosis not present

## 2018-07-19 DIAGNOSIS — J4540 Moderate persistent asthma, uncomplicated: Secondary | ICD-10-CM | POA: Diagnosis not present

## 2018-07-19 DIAGNOSIS — K219 Gastro-esophageal reflux disease without esophagitis: Secondary | ICD-10-CM | POA: Diagnosis not present

## 2018-07-19 DIAGNOSIS — Z794 Long term (current) use of insulin: Secondary | ICD-10-CM | POA: Diagnosis not present

## 2018-07-19 DIAGNOSIS — G7000 Myasthenia gravis without (acute) exacerbation: Secondary | ICD-10-CM | POA: Diagnosis not present

## 2018-07-19 DIAGNOSIS — R6520 Severe sepsis without septic shock: Secondary | ICD-10-CM | POA: Diagnosis not present

## 2018-07-19 DIAGNOSIS — A419 Sepsis, unspecified organism: Secondary | ICD-10-CM | POA: Diagnosis not present

## 2018-07-19 DIAGNOSIS — I130 Hypertensive heart and chronic kidney disease with heart failure and stage 1 through stage 4 chronic kidney disease, or unspecified chronic kidney disease: Secondary | ICD-10-CM | POA: Diagnosis not present

## 2018-07-19 DIAGNOSIS — N189 Chronic kidney disease, unspecified: Secondary | ICD-10-CM | POA: Diagnosis not present

## 2018-07-19 NOTE — Telephone Encounter (Signed)
Dr. Ancil Boozer please get with the admin staff so they can get this patient scheduled

## 2018-07-19 NOTE — Telephone Encounter (Signed)
Left voice message on [phone] @ 8:40 asking them to return call. Called [phone] and spoke with patient. He is not able to come on Thursday due to having an appt with the cardiologist. Is there anywhere else we can put him? Dr Ancil Boozer schedule is booked.

## 2018-07-20 ENCOUNTER — Encounter: Payer: Self-pay | Admitting: Emergency Medicine

## 2018-07-20 ENCOUNTER — Emergency Department: Payer: Medicare Other

## 2018-07-20 ENCOUNTER — Other Ambulatory Visit: Payer: Self-pay

## 2018-07-20 ENCOUNTER — Inpatient Hospital Stay
Admission: EM | Admit: 2018-07-20 | Discharge: 2018-07-24 | DRG: 871 | Disposition: A | Discharge status: Home Health | Payer: Medicare Other | Attending: Internal Medicine | Admitting: Internal Medicine

## 2018-07-20 DIAGNOSIS — K219 Gastro-esophageal reflux disease without esophagitis: Secondary | ICD-10-CM | POA: Diagnosis present

## 2018-07-20 DIAGNOSIS — Z8249 Family history of ischemic heart disease and other diseases of the circulatory system: Secondary | ICD-10-CM

## 2018-07-20 DIAGNOSIS — I5042 Chronic combined systolic (congestive) and diastolic (congestive) heart failure: Secondary | ICD-10-CM | POA: Diagnosis not present

## 2018-07-20 DIAGNOSIS — E1165 Type 2 diabetes mellitus with hyperglycemia: Secondary | ICD-10-CM | POA: Diagnosis not present

## 2018-07-20 DIAGNOSIS — R6520 Severe sepsis without septic shock: Secondary | ICD-10-CM | POA: Diagnosis not present

## 2018-07-20 DIAGNOSIS — I2510 Atherosclerotic heart disease of native coronary artery without angina pectoris: Secondary | ICD-10-CM | POA: Diagnosis not present

## 2018-07-20 DIAGNOSIS — H532 Diplopia: Secondary | ICD-10-CM | POA: Diagnosis not present

## 2018-07-20 DIAGNOSIS — G4733 Obstructive sleep apnea (adult) (pediatric): Secondary | ICD-10-CM

## 2018-07-20 DIAGNOSIS — R7989 Other specified abnormal findings of blood chemistry: Secondary | ICD-10-CM | POA: Diagnosis not present

## 2018-07-20 DIAGNOSIS — E1122 Type 2 diabetes mellitus with diabetic chronic kidney disease: Secondary | ICD-10-CM | POA: Diagnosis present

## 2018-07-20 DIAGNOSIS — Z7982 Long term (current) use of aspirin: Secondary | ICD-10-CM | POA: Diagnosis not present

## 2018-07-20 DIAGNOSIS — L409 Psoriasis, unspecified: Secondary | ICD-10-CM | POA: Diagnosis not present

## 2018-07-20 DIAGNOSIS — N182 Chronic kidney disease, stage 2 (mild): Secondary | ICD-10-CM | POA: Diagnosis not present

## 2018-07-20 DIAGNOSIS — M7989 Other specified soft tissue disorders: Secondary | ICD-10-CM | POA: Diagnosis not present

## 2018-07-20 DIAGNOSIS — G9341 Metabolic encephalopathy: Secondary | ICD-10-CM | POA: Diagnosis not present

## 2018-07-20 DIAGNOSIS — G9340 Encephalopathy, unspecified: Secondary | ICD-10-CM | POA: Diagnosis not present

## 2018-07-20 DIAGNOSIS — Z9989 Dependence on other enabling machines and devices: Secondary | ICD-10-CM

## 2018-07-20 DIAGNOSIS — I130 Hypertensive heart and chronic kidney disease with heart failure and stage 1 through stage 4 chronic kidney disease, or unspecified chronic kidney disease: Secondary | ICD-10-CM | POA: Diagnosis present

## 2018-07-20 DIAGNOSIS — Z833 Family history of diabetes mellitus: Secondary | ICD-10-CM | POA: Diagnosis not present

## 2018-07-20 DIAGNOSIS — H02402 Unspecified ptosis of left eyelid: Secondary | ICD-10-CM | POA: Diagnosis not present

## 2018-07-20 DIAGNOSIS — R509 Fever, unspecified: Secondary | ICD-10-CM | POA: Diagnosis not present

## 2018-07-20 DIAGNOSIS — I252 Old myocardial infarction: Secondary | ICD-10-CM

## 2018-07-20 DIAGNOSIS — R000 Tachycardia, unspecified: Secondary | ICD-10-CM | POA: Diagnosis not present

## 2018-07-20 DIAGNOSIS — I25118 Atherosclerotic heart disease of native coronary artery with other forms of angina pectoris: Secondary | ICD-10-CM | POA: Diagnosis not present

## 2018-07-20 DIAGNOSIS — E119 Type 2 diabetes mellitus without complications: Secondary | ICD-10-CM | POA: Diagnosis not present

## 2018-07-20 DIAGNOSIS — A419 Sepsis, unspecified organism: Principal | ICD-10-CM | POA: Diagnosis present

## 2018-07-20 DIAGNOSIS — E1142 Type 2 diabetes mellitus with diabetic polyneuropathy: Secondary | ICD-10-CM | POA: Diagnosis not present

## 2018-07-20 DIAGNOSIS — I255 Ischemic cardiomyopathy: Secondary | ICD-10-CM | POA: Diagnosis present

## 2018-07-20 DIAGNOSIS — Z955 Presence of coronary angioplasty implant and graft: Secondary | ICD-10-CM

## 2018-07-20 DIAGNOSIS — R609 Edema, unspecified: Secondary | ICD-10-CM

## 2018-07-20 DIAGNOSIS — K5730 Diverticulosis of large intestine without perforation or abscess without bleeding: Secondary | ICD-10-CM | POA: Diagnosis not present

## 2018-07-20 DIAGNOSIS — G7000 Myasthenia gravis without (acute) exacerbation: Secondary | ICD-10-CM | POA: Diagnosis not present

## 2018-07-20 DIAGNOSIS — E118 Type 2 diabetes mellitus with unspecified complications: Secondary | ICD-10-CM

## 2018-07-20 DIAGNOSIS — Z8042 Family history of malignant neoplasm of prostate: Secondary | ICD-10-CM

## 2018-07-20 DIAGNOSIS — E785 Hyperlipidemia, unspecified: Secondary | ICD-10-CM | POA: Diagnosis present

## 2018-07-20 DIAGNOSIS — M545 Low back pain: Secondary | ICD-10-CM | POA: Diagnosis not present

## 2018-07-20 DIAGNOSIS — R918 Other nonspecific abnormal finding of lung field: Secondary | ICD-10-CM | POA: Diagnosis not present

## 2018-07-20 DIAGNOSIS — R21 Rash and other nonspecific skin eruption: Secondary | ICD-10-CM | POA: Diagnosis not present

## 2018-07-20 DIAGNOSIS — I10 Essential (primary) hypertension: Secondary | ICD-10-CM | POA: Diagnosis present

## 2018-07-20 DIAGNOSIS — IMO0002 Reserved for concepts with insufficient information to code with codable children: Secondary | ICD-10-CM | POA: Diagnosis present

## 2018-07-20 DIAGNOSIS — Z794 Long term (current) use of insulin: Secondary | ICD-10-CM | POA: Diagnosis not present

## 2018-07-20 DIAGNOSIS — I509 Heart failure, unspecified: Secondary | ICD-10-CM | POA: Diagnosis not present

## 2018-07-20 LAB — URINALYSIS, COMPLETE (UACMP) WITH MICROSCOPIC
Bacteria, UA: NONE SEEN
Bilirubin Urine: NEGATIVE
Glucose, UA: >=500 mg/dL — AB
KETONES UR: 5 mg/dL — AB
LEUKOCYTES UA: NEGATIVE
Nitrite: NEGATIVE
PH: 5 (ref 5.0–8.0)
PROTEIN: 100 mg/dL — AB
SPECIFIC GRAVITY, URINE: 1.027 (ref 1.005–1.030)
SQUAMOUS EPITHELIAL / LPF: NONE SEEN (ref 0–5)

## 2018-07-20 LAB — CBC WITH DIFFERENTIAL/PLATELET
ABS IMMATURE GRANULOCYTES: 0.06 10*3/uL (ref 0.00–0.07)
Basophils Absolute: 0 10*3/uL (ref 0.0–0.1)
Basophils Relative: 0 %
EOS PCT: 0 %
Eosinophils Absolute: 0 10*3/uL (ref 0.0–0.5)
HEMATOCRIT: 34.6 % — AB (ref 39.0–52.0)
Hemoglobin: 11.2 g/dL — ABNORMAL LOW (ref 13.0–17.0)
Immature Granulocytes: 1 %
LYMPHS PCT: 2 %
Lymphs Abs: 0.2 10*3/uL — ABNORMAL LOW (ref 0.7–4.0)
MCH: 28.9 pg (ref 26.0–34.0)
MCHC: 32.4 g/dL (ref 30.0–36.0)
MCV: 89.2 fL (ref 80.0–100.0)
MONO ABS: 0.4 10*3/uL (ref 0.1–1.0)
MONOS PCT: 5 %
NEUTROS ABS: 8.1 10*3/uL — AB (ref 1.7–7.7)
Neutrophils Relative %: 92 %
PLATELETS: 303 10*3/uL (ref 150–400)
RBC: 3.88 MIL/uL — ABNORMAL LOW (ref 4.22–5.81)
RDW: 15.1 % (ref 11.5–15.5)
WBC: 8.8 10*3/uL (ref 4.0–10.5)
nRBC: 0 % (ref 0.0–0.2)

## 2018-07-20 LAB — COMPREHENSIVE METABOLIC PANEL
ALBUMIN: 3.2 g/dL — AB (ref 3.5–5.0)
ALK PHOS: 70 U/L (ref 38–126)
ALT: 25 U/L (ref 0–44)
ANION GAP: 11 (ref 5–15)
AST: 37 U/L (ref 15–41)
BILIRUBIN TOTAL: 0.7 mg/dL (ref 0.3–1.2)
BUN: 13 mg/dL (ref 6–20)
CALCIUM: 8.6 mg/dL — AB (ref 8.9–10.3)
CO2: 23 mmol/L (ref 22–32)
CREATININE: 0.91 mg/dL (ref 0.61–1.24)
Chloride: 100 mmol/L (ref 98–111)
GFR calc non Af Amer: >60 mL/min (ref >60)
GLUCOSE: 295 mg/dL — AB (ref 70–99)
Potassium: 4.2 mmol/L (ref 3.5–5.1)
SODIUM: 134 mmol/L — AB (ref 135–145)
TOTAL PROTEIN: 6 g/dL — AB (ref 6.5–8.1)

## 2018-07-20 LAB — LACTIC ACID, PLASMA
Lactic Acid, Venous: 3.1 mmol/L (ref 0.5–1.9)
Lactic Acid, Venous: 2.6 mmol/L (ref 0.5–1.9)

## 2018-07-20 LAB — INFLUENZA PANEL BY PCR (TYPE A & B)
Influenza A By PCR: NEGATIVE
Influenza B By PCR: NEGATIVE

## 2018-07-20 LAB — TROPONIN I: Troponin I: 0.07 ng/mL (ref <0.03)

## 2018-07-20 LAB — PROCALCITONIN: Procalcitonin: 0.51 ng/mL

## 2018-07-20 LAB — LIPASE, BLOOD: Lipase: 21 U/L (ref 11–51)

## 2018-07-20 MED ORDER — SODIUM CHLORIDE 0.9 % IV SOLN
2.0000 g | Freq: Once | INTRAVENOUS | Status: AC
  Administered 2018-07-20: 2 g via INTRAVENOUS
  Filled 2018-07-20: qty 2

## 2018-07-20 MED ORDER — SODIUM CHLORIDE 0.9 % IV BOLUS
1000.0000 mL | Freq: Once | INTRAVENOUS | Status: AC
  Administered 2018-07-20: 1000 mL via INTRAVENOUS

## 2018-07-20 MED ORDER — VANCOMYCIN HCL IN DEXTROSE 1-5 GM/200ML-% IV SOLN
1000.0000 mg | Freq: Once | INTRAVENOUS | Status: AC
  Administered 2018-07-20: 1000 mg via INTRAVENOUS
  Filled 2018-07-20: qty 200

## 2018-07-20 MED ORDER — ACETAMINOPHEN 500 MG PO TABS
1000.0000 mg | ORAL_TABLET | Freq: Once | ORAL | Status: AC
  Administered 2018-07-20: 1000 mg via ORAL
  Filled 2018-07-20: qty 2

## 2018-07-20 NOTE — ED Notes (Signed)
EDP at bedside at this time.  

## 2018-07-20 NOTE — ED Notes (Signed)
Date and time results received: 07/20/18 9:37 PM  (use smartphrase ".now" to insert current time)  Test: Lactic  Critical Value: 3.1  Name of Provider Notified: Dr. Cherylann Banas  Orders Received? Or Actions Taken?: Actions Taken: Critical Results Acknowledged

## 2018-07-20 NOTE — ED Triage Notes (Signed)
Pt presents to ED via [facility]. Per EMS pt was seen last week and admitted for infection with unknown source and placed on 2 abx, pt finished abx today but is still having fevers. EMS reports at home temp 102.9, EMS reports their temp was 101.1 orally. Pt is alert and oriented on arrival, per EMS no sepsis alert as he did not meed criteria. Pt c/o nausea and fevers at this time, reports yesterday had dark urine with foul smell.   ETCO2 34 20RR 93% on RA 149HR ST 101.1 orally 142/95

## 2018-07-20 NOTE — ED Provider Notes (Signed)
[facility] Emergency Department Provider Note ____________________________________________   First MD Initiated Contact with Patient 07/20/18 2034     (approximate)  I have reviewed the triage vital signs and the nursing notes.   HISTORY  Chief Complaint Fever and Nausea    HPI [name] is a 57 y.o. male with PMH as noted below who presents with fever over the last 2 to 3 days, intermittent, associated with nausea and a few episodes of vomiting, as well as with generalized weakness.  The patient states that he feels similarly to how he felt when he was admitted for sepsis the week before last.  Patient states that he felt fine for a few days after going home before the symptoms started again.  He denies cough or chest pain, diarrhea, abdominal pain, or urinary symptoms.  Past Medical History:  Diagnosis Date  . Allergy    dust, seasonal (worse in the fall).  . 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 Followed by Pain Specialist in [place], back and neck  . Asthma    BRONCHITIS  . Cataract    First Dx in 2012  . Chronic combined systolic and diastolic congestive heart failure ([place])    a. 03/2018 Echo: EF 30-35%, ant, antlat, apical AK, Gr1 DD.  [name] Coronary artery disease    a. Prior Ant MI->s/p multiple stents placed in the LAD and right coronary artery ([place]); b. 2016 Cath: reportedly nonobs dzs;  c. 02/2017 MV: EF 45-54%, ap/ant, ap/inf, apical infarct, no ishcemia; c. 04/2018 Cath/PCI: LM nl, LAD 20p, patent mid stent, LCX 54m(3.25x15 [place] DES), OM1 nl, OM2 50, OM3 40 w/ patent stent, RCA 40p, 72m, 40d w/ patent stent in RPDA, RPAV 60, EF 25-35%. 2+MR.  . Diabetes mellitus without complication ([place])    TYPE 2  . Diabetic peripheral neuropathy (HCC)    feet and hands  . GERD (gastroesophageal reflux disease)   . Headache    muscle tension  . Hyperlipidemia   . Hypertension    CONTROLLED ON MEDS  . Insomnia   . Ischemic cardiomyopathy    a. 03/2018  Echo: EF 30-35%, ant, antlat, apical AK, Gr1 DD, mild MR, mildly dil LA.  [name] Lyme disease    Chronic  . Myasthenia gravis ([place])   . Myocardial infarction ([place]) 2010  . Seasonal allergies   . Sleep apnea    CPAP    Patient Active Problem List   Diagnosis Date Noted  . Severe sepsis ([place]) 07/08/2018  . Colitis 07/08/2018  . OSA on CPAP 07/08/2018  . Acute renal failure superimposed on chronic kidney disease ([place]) 07/08/2018  . Unstable angina ([place]) 04/26/2018  . Ischemic cardiomyopathy 04/26/2018  . Chronic combined systolic and diastolic CHF (congestive heart failure) ([place]) 04/26/2018  . Effort angina ([place]) 04/25/2018  . 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 Hyperlipidemia due to type 2 diabetes mellitus ([place]) 08/12/2017  . Vitamin D deficiency, unspecified 08/12/2017  . Ptosis of left eyelid 01/08/2017  . Dermatitis 12/24/2016  . Difficulty walking 04/27/2016  . Foot cramps 04/27/2016  . Morbid (severe) obesity due to excess calories ([place]) 04/06/2016  . Benign neoplasm of sigmoid colon   . Benign neoplasm of descending colon   . Benign neoplasm of transverse colon   . Coronary artery disease involving native coronary artery with angina pectoris ([place]) 10/22/2015  . Coronary artery disease 08/22/2015  . DM (diabetes mellitus), type 2, uncontrolled with complications ([place]) 01/75/1025  .  Myasthenia gravis ([place]) 08/22/2015  . Chronic left-sided low back pain 08/22/2015  . Hypertension 08/22/2015  . GERD (gastroesophageal reflux disease) 08/22/2015  . Arthritis 08/22/2015  . Diabetic peripheral neuropathy ([place]) 08/22/2015  . Lyme disease 08/22/2015  . Hyperlipidemia 08/22/2015  . Insomnia 08/22/2015  . Type 2 diabetes mellitus with diabetic polyneuropathy, with long-term current use of insulin ([place]) 08/22/2015    Past Surgical History:  Procedure Laterality Date  . BILATERAL CARPAL TUNNEL RELEASE Bilateral L in 2012 and R in 2013  . CARDIAC  CATHETERIZATION     Several Caths, most recent in  March 2016.  [name] COLONOSCOPY WITH PROPOFOL N/A 01/10/2016   Procedure: COLONOSCOPY WITH PROPOFOL;  Surgeon: [name], MD;  Location: [facility] ENDOSCOPY;  Service: Endoscopy;  Laterality: N/A;  . CORONARY ANGIOPLASTY    . CORONARY STENT INTERVENTION N/A 04/25/2018   Procedure: CORONARY STENT INTERVENTION;  Surgeon: [name], MD;  Location: [place] [facility];  Service: Cardiovascular;  Laterality: N/A;  . ESOPHAGOGASTRODUODENOSCOPY (EGD) WITH PROPOFOL N/A 01/10/2016   Procedure: ESOPHAGOGASTRODUODENOSCOPY (EGD) WITH PROPOFOL;  Surgeon: [name], MD;  Location: [facility] ENDOSCOPY;  Service: Endoscopy;  Laterality: N/A;  . EYE SURGERY Bilateral 2012   cataract/bilateral vitrectomies  . LEFT HEART CATH AND CORONARY ANGIOGRAPHY Left 04/25/2018   Procedure: LEFT HEART CATH AND CORONARY ANGIOGRAPHY;  Surgeon: [name], MD;  Location: [place] [facility];  Service: Cardiovascular;  Laterality: Left;  . TONSILLECTOMY AND ADENOIDECTOMY     As a child  . TUNNELED VENOUS CATHETER PLACEMENT     removed    Prior to Admission medications   Medication Sig Start Date End Date Taking? Authorizing Provider  albuterol (PROVENTIL HFA;VENTOLIN HFA) 108 (90 Base) MCG/ACT inhaler Inhale 2 puffs into the lungs every 6 (six) hours as needed for wheezing or shortness of breath.   Yes [provider]  albuterol (PROVENTIL) (2.5 MG/3ML) 0.083% nebulizer solution Take 2.5 mg by nebulization every 6 (six) hours as needed for wheezing or shortness of breath.   Yes [provider]  aspirin EC 81 MG tablet Take 1 tablet (81 mg total) by mouth daily. 03/28/18  Yes Theora Gianotti, NP  BREO ELLIPTA 200-25 MCG/INH AEPB INHALE 1 PUFF INTO THE LUNGS DAILY Patient taking differently: Inhale 1 puff into the lungs daily.  06/08/18  Yes Poulose, Bethel Born, NP  carvedilol (COREG) 6.25 MG tablet Take 1 tablet (6.25 mg total) by mouth 2 (two) times  daily. 05/12/18  Yes [name], MD  clopidogrel (PLAVIX) 75 MG tablet Take 1 tablet (75 mg total) by mouth daily with breakfast. 04/27/18  Yes Theora Gianotti, NP  cyanocobalamin 1000 MCG tablet Take 1,000 mcg by mouth daily.    Yes [provider]  dexlansoprazole (DEXILANT) 60 MG capsule Take 1 capsule (60 mg total) by mouth daily. 06/29/18  Yes [name], MD  gabapentin (NEURONTIN) 300 MG capsule Take 1 capsule (300 mg total) by mouth 3 (three) times daily. 09/25/15  Yes Rochel Brome A, MD  insulin lispro (HUMALOG KWIKPEN) 100 UNIT/ML KiwkPen Inject 7-8 Units into the skin 3 (three) times daily. Reported on 12/02/2015/ sliding scale 1 unit for every 8 units of carbs; and 1 unit for every 20 above 120   Yes [provider]  Insulin Pen Needle (FIFTY50 PEN NEEDLES) 32G X 4 MM MISC Inject 1 each into the skin as needed. 02/24/18  Yes [provider]  LANTUS SOLOSTAR 100 UNIT/ML Solostar Pen Inject  25-30 Units into the skin daily at 10 pm.  03/25/16  Yes Sherri Sear, MD  levocetirizine (XYZAL) 5 MG tablet Take 1 tablet (5 mg total) by mouth every evening. 06/07/18  Yes Poulose, Bethel Born, NP  liraglutide (VICTOZA) 18 MG/3ML SOPN 1.8 units every morning Patient taking differently: Inject 1.8 mg into the skin daily.  04/26/18  Yes Theora Gianotti, NP  loratadine (CLARITIN) 10 MG tablet Take 10 mg by mouth daily.    Yes [provider]  metFORMIN (GLUCOPHAGE) 1000 MG tablet Take 1,000 mg by mouth 2 (two) times daily with a meal.   Yes Sherri Sear, MD  rosuvastatin (CRESTOR) 5 MG tablet Take 5 mg by mouth daily. 11/20/17  Yes [name], MD  sacubitril-valsartan (ENTRESTO) 24-26 MG Take 1 tablet by mouth 2 (two) times daily. 04/26/18  Yes Theora Gianotti, NP  ezetimibe (ZETIA) 10 MG tablet Take 1 tablet (10 mg total) by mouth daily. 04/11/18 07/10/18  Theora Gianotti, NP  levofloxacin (LEVAQUIN) 500 MG  tablet Take 500 mg by mouth daily.    [provider]  metroNIDAZOLE (FLAGYL) 500 MG tablet Take 500 mg by mouth 3 (three) times daily.    [provider]  naloxone [facility]) nasal spray 4 mg/0.1 mL For excess sedation from opioids 09/15/16   Molli Barrows, MD  nitroGLYCERIN (NITROSTAT) 0.3 MG SL tablet Place 1 tablet (0.3 mg total) under the tongue every 5 (five) minutes as needed for chest pain. Reported on 12/02/2015 01/25/17   [name], MD    Allergies Metoprolol and Novolog [insulin aspart]  Family History  Problem Relation Age of Onset  . Diabetes Mother   . Heart disease Mother   . Cancer Father        Prostate CA  . Diabetes Brother   . Healthy Brother   . Healthy Brother     Social History Social History   Tobacco Use  . Smoking status: Never Smoker  . Smokeless tobacco: Never Used  . Tobacco comment: smoking cessation materials not required  Substance Use Topics  . Alcohol use: Not Currently    Alcohol/week: 0.0 standard drinks  . Drug use: No    Review of Systems  Constitutional: Positive for fever. Eyes: No redness. ENT: No sore throat. Cardiovascular: Denies chest pain. Respiratory: Denies shortness of breath. Gastrointestinal: Positive for nausea and vomiting.  No diarrhea..  Genitourinary: Negative for dysuria.  Musculoskeletal: Negative for back pain. Skin: Negative for rash. Neurological: Negative for headache.   ____________________________________________   PHYSICAL EXAM:  VITAL SIGNS: ED Triage Vitals  Enc Vitals Group     BP 07/20/18 2035 (!) 151/74     Pulse Rate 07/20/18 2035 (!) 148     Resp 07/20/18 2035 20     Temp 07/20/18 2035 99.2 F (37.3 C)     Temp Source 07/20/18 2035 Oral     SpO2 07/20/18 2035 94 %     Weight 07/20/18 2033 252 lb 9.6 oz (114.6 kg)     Height 07/20/18 2033 5\' 7"  (1.702 m)     Head Circumference --      Peak Flow --      Pain Score 07/20/18 2033 4     Pain Loc --      Pain  Edu? --      Excl. in [place]? --     Constitutional: Alert and oriented.  Slightly uncomfortable but relatively well-appearing.   Eyes: Conjunctivae  are normal.  Head: Atraumatic. Nose: No congestion/rhinnorhea. Mouth/Throat: Mucous membranes are somewhat dry.   Neck: Normal range of motion.  Cardiovascular: Tachycardic, regular rhythm. Grossly normal heart sounds.  Good peripheral circulation. Respiratory: Normal respiratory effort.  No retractions. Lungs CTAB. Gastrointestinal: Soft and nontender. No distention.  Genitourinary: No flank tenderness. Musculoskeletal: No lower extremity edema.  Extremities warm and well perfused.  Neurologic:  Normal speech and language. No gross focal neurologic deficits are appreciated.  Skin:  Skin is warm and dry. No rash noted. Psychiatric: Mood and affect are normal. Speech and behavior are normal.  ____________________________________________   LABS (all labs ordered are listed, but only abnormal results are displayed)  Labs Reviewed  LACTIC ACID, PLASMA - Abnormal; Notable for the following components:      Result Value   Lactic Acid, Venous 3.1 (*)    All other components within normal limits  COMPREHENSIVE METABOLIC PANEL - Abnormal; Notable for the following components:   Sodium 134 (*)    Glucose, Bld 295 (*)    Calcium 8.6 (*)    Total Protein 6.0 (*)    Albumin 3.2 (*)    All other components within normal limits  TROPONIN I - Abnormal; Notable for the following components:   Troponin I 0.07 (*)    All other components within normal limits  CBC WITH DIFFERENTIAL/PLATELET - Abnormal; Notable for the following components:   RBC 3.88 (*)    Hemoglobin 11.2 (*)    HCT 34.6 (*)    Neutro Abs 8.1 (*)    Lymphs Abs 0.2 (*)    All other components within normal limits  CULTURE, BLOOD (ROUTINE X 2)  CULTURE, BLOOD (ROUTINE X 2)  LIPASE, BLOOD  PROCALCITONIN  INFLUENZA PANEL BY PCR (TYPE A & B)  LACTIC ACID, PLASMA  URINALYSIS,  COMPLETE (UACMP) WITH MICROSCOPIC   ____________________________________________  EKG  ED ECG REPORT I, Arta Silence, the attending physician, personally viewed and interpreted this ECG.  Date: 07/20/2018 EKG Time: 2034 Rate: 148 Rhythm: Sinus tachycardia QRS Axis: normal Intervals: normal ST/T Wave abnormalities: normal Narrative Interpretation: no evidence of acute ischemia  ____________________________________________  RADIOLOGY  CXR: No focal infiltrate  ____________________________________________   PROCEDURES  Procedure(s) performed: No  Procedures  Critical Care performed: Yes  CRITICAL CARE Performed by: Arta Silence   Total critical care time: 30 minutes  Critical care time was exclusive of separately billable procedures and treating other patients.  Critical care was necessary to treat or prevent imminent or life-threatening deterioration.  Critical care was time spent personally by me on the following activities: development of treatment plan with patient and/or surrogate as well as nursing, discussions with consultants, evaluation of patient's response to treatment, examination of patient, obtaining history from patient or surrogate, ordering and performing treatments and interventions, ordering and review of laboratory studies, ordering and review of radiographic studies, pulse oximetry and re-evaluation of patient's condition. ____________________________________________   INITIAL IMPRESSION / ASSESSMENT AND PLAN / ED COURSE  Pertinent labs & imaging results that were available during my care of the patient were reviewed by me and considered in my medical decision making (see chart for details).  57 year old male with PMH as noted above presents with fever, weakness, and some nausea and vomiting over the last few days.  I reviewed the past medical records in epic; the patient was admitted between 07/07/2018 and 07/13/2018 with fever and  sepsis, with a diagnosis of colitis based on CT.  However the patient states  that he never had diarrhea and did not really have much abdominal pain during this.  He has since finished his antibiotics at home.  On exam the patient is relatively well-appearing compared to his vital signs.  He has a low-grade fever and is significantly tachycardic but his other vital signs are normal.  The remainder of the exam is as described above.  The abdomen is soft and nontender.  Differential includes persistent colitis, UTI, healthcare associated pneumonia, viral syndrome.  We will obtain sepsis work-up, give fluids and antipyretics, and reassess.  ----------------------------------------- 10:44 PM on 07/20/2018 -----------------------------------------  The work-up so far is consistent with sepsis but unrevealing in terms of the source.  The patient has an elevated lactate and he remains significantly tachycardic.  His chest x-ray shows no findings suggestive of pneumonia although the UA still pending.  He will require admission.  I signed the patient out to the hospitalist Dr. Jannifer Franklin.  I initially held off on antibiotics since there was no clear source although based on discussion with the hospitalist we will start empiric antibiotics once the urine is obtained. ____________________________________________   FINAL CLINICAL IMPRESSION(S) / ED DIAGNOSES  Final diagnoses:  Sepsis with acute organ dysfunction without septic shock, due to unspecified organism, unspecified type ([place])      NEW MEDICATIONS STARTED DURING THIS VISIT:  New Prescriptions   No medications on file     Note:  This document was prepared using Dragon voice recognition software and may include unintentional dictation errors.    Arta Silence, MD 07/20/18 2245

## 2018-07-20 NOTE — ED Notes (Signed)
Date and time results received: 07/20/18 10:10 PM  (use smartphrase ".now" to insert current time)  Test: Trop Critical Value: 0.07  Name of Provider Notified: Dr. Cherylann Banas  Orders Received? Or Actions Taken?: Critical Results Acknowledged

## 2018-07-20 NOTE — Telephone Encounter (Signed)
Maybe next week?

## 2018-07-21 ENCOUNTER — Other Ambulatory Visit: Payer: Self-pay

## 2018-07-21 ENCOUNTER — Ambulatory Visit: Payer: Medicare Other | Admitting: Cardiovascular Disease

## 2018-07-21 ENCOUNTER — Inpatient Hospital Stay: Payer: Medicare Other

## 2018-07-21 DIAGNOSIS — I5042 Chronic combined systolic (congestive) and diastolic (congestive) heart failure: Secondary | ICD-10-CM | POA: Diagnosis present

## 2018-07-21 DIAGNOSIS — N182 Chronic kidney disease, stage 2 (mild): Secondary | ICD-10-CM | POA: Diagnosis present

## 2018-07-21 DIAGNOSIS — E1165 Type 2 diabetes mellitus with hyperglycemia: Secondary | ICD-10-CM | POA: Diagnosis present

## 2018-07-21 DIAGNOSIS — Z7982 Long term (current) use of aspirin: Secondary | ICD-10-CM

## 2018-07-21 DIAGNOSIS — A419 Sepsis, unspecified organism: Principal | ICD-10-CM

## 2018-07-21 DIAGNOSIS — G4733 Obstructive sleep apnea (adult) (pediatric): Secondary | ICD-10-CM | POA: Diagnosis present

## 2018-07-21 DIAGNOSIS — Z833 Family history of diabetes mellitus: Secondary | ICD-10-CM | POA: Diagnosis not present

## 2018-07-21 DIAGNOSIS — I2510 Atherosclerotic heart disease of native coronary artery without angina pectoris: Secondary | ICD-10-CM | POA: Diagnosis present

## 2018-07-21 DIAGNOSIS — I255 Ischemic cardiomyopathy: Secondary | ICD-10-CM | POA: Diagnosis present

## 2018-07-21 DIAGNOSIS — I130 Hypertensive heart and chronic kidney disease with heart failure and stage 1 through stage 4 chronic kidney disease, or unspecified chronic kidney disease: Secondary | ICD-10-CM | POA: Diagnosis present

## 2018-07-21 DIAGNOSIS — G7000 Myasthenia gravis without (acute) exacerbation: Secondary | ICD-10-CM | POA: Diagnosis present

## 2018-07-21 DIAGNOSIS — Z888 Allergy status to other drugs, medicaments and biological substances status: Secondary | ICD-10-CM

## 2018-07-21 DIAGNOSIS — R509 Fever, unspecified: Secondary | ICD-10-CM | POA: Diagnosis not present

## 2018-07-21 DIAGNOSIS — Z7902 Long term (current) use of antithrombotics/antiplatelets: Secondary | ICD-10-CM

## 2018-07-21 DIAGNOSIS — L409 Psoriasis, unspecified: Secondary | ICD-10-CM

## 2018-07-21 DIAGNOSIS — Z794 Long term (current) use of insulin: Secondary | ICD-10-CM

## 2018-07-21 DIAGNOSIS — Z79899 Other long term (current) drug therapy: Secondary | ICD-10-CM

## 2018-07-21 DIAGNOSIS — E1142 Type 2 diabetes mellitus with diabetic polyneuropathy: Secondary | ICD-10-CM | POA: Diagnosis present

## 2018-07-21 DIAGNOSIS — R7989 Other specified abnormal findings of blood chemistry: Secondary | ICD-10-CM | POA: Diagnosis not present

## 2018-07-21 DIAGNOSIS — E785 Hyperlipidemia, unspecified: Secondary | ICD-10-CM | POA: Diagnosis present

## 2018-07-21 DIAGNOSIS — I25118 Atherosclerotic heart disease of native coronary artery with other forms of angina pectoris: Secondary | ICD-10-CM | POA: Diagnosis not present

## 2018-07-21 DIAGNOSIS — K219 Gastro-esophageal reflux disease without esophagitis: Secondary | ICD-10-CM | POA: Diagnosis present

## 2018-07-21 DIAGNOSIS — R6520 Severe sepsis without septic shock: Secondary | ICD-10-CM | POA: Diagnosis present

## 2018-07-21 DIAGNOSIS — Z8042 Family history of malignant neoplasm of prostate: Secondary | ICD-10-CM | POA: Diagnosis not present

## 2018-07-21 DIAGNOSIS — I252 Old myocardial infarction: Secondary | ICD-10-CM | POA: Diagnosis not present

## 2018-07-21 DIAGNOSIS — M545 Low back pain: Secondary | ICD-10-CM

## 2018-07-21 DIAGNOSIS — Z955 Presence of coronary angioplasty implant and graft: Secondary | ICD-10-CM

## 2018-07-21 DIAGNOSIS — E1122 Type 2 diabetes mellitus with diabetic chronic kidney disease: Secondary | ICD-10-CM | POA: Diagnosis present

## 2018-07-21 DIAGNOSIS — H532 Diplopia: Secondary | ICD-10-CM

## 2018-07-21 DIAGNOSIS — G9341 Metabolic encephalopathy: Secondary | ICD-10-CM | POA: Diagnosis present

## 2018-07-21 DIAGNOSIS — Z8249 Family history of ischemic heart disease and other diseases of the circulatory system: Secondary | ICD-10-CM | POA: Diagnosis not present

## 2018-07-21 DIAGNOSIS — H02402 Unspecified ptosis of left eyelid: Secondary | ICD-10-CM

## 2018-07-21 LAB — TROPONIN I
TROPONIN I: 0.07 ng/mL — AB (ref <0.03)
TROPONIN I: 0.06 ng/mL — AB (ref <0.03)
Troponin I: 0.05 ng/mL (ref <0.03)

## 2018-07-21 LAB — BASIC METABOLIC PANEL
Anion gap: 6 (ref 5–15)
BUN: 14 mg/dL (ref 6–20)
CALCIUM: 8 mg/dL — AB (ref 8.9–10.3)
CO2: 25 mmol/L (ref 22–32)
CREATININE: 1.15 mg/dL (ref 0.61–1.24)
Chloride: 105 mmol/L (ref 98–111)
GFR calc non Af Amer: >60 mL/min (ref >60)
Glucose, Bld: 324 mg/dL — ABNORMAL HIGH (ref 70–99)
Potassium: 4.8 mmol/L (ref 3.5–5.1)
SODIUM: 136 mmol/L (ref 135–145)

## 2018-07-21 LAB — CBC
HCT: 31.1 % — ABNORMAL LOW (ref 39.0–52.0)
Hemoglobin: 9.8 g/dL — ABNORMAL LOW (ref 13.0–17.0)
MCH: 28.4 pg (ref 26.0–34.0)
MCHC: 31.5 g/dL (ref 30.0–36.0)
MCV: 90.1 fL (ref 80.0–100.0)
NRBC: 0 % (ref 0.0–0.2)
PLATELETS: 270 10*3/uL (ref 150–400)
RBC: 3.45 MIL/uL — ABNORMAL LOW (ref 4.22–5.81)
RDW: 15.1 % (ref 11.5–15.5)
WBC: 9.4 10*3/uL (ref 4.0–10.5)

## 2018-07-21 LAB — GLUCOSE, CAPILLARY
GLUCOSE-CAPILLARY: 280 mg/dL — AB (ref 70–99)
GLUCOSE-CAPILLARY: 307 mg/dL — AB (ref 70–99)
Glucose-Capillary: 296 mg/dL — ABNORMAL HIGH (ref 70–99)
Glucose-Capillary: 262 mg/dL — ABNORMAL HIGH (ref 70–99)

## 2018-07-21 LAB — LACTIC ACID, PLASMA: Lactic Acid, Venous: 1.8 mmol/L (ref 0.5–1.9)

## 2018-07-21 MED ORDER — ASPIRIN EC 81 MG PO TBEC
81.0000 mg | DELAYED_RELEASE_TABLET | Freq: Every day | ORAL | Status: DC
  Administered 2018-07-21 – 2018-07-24 (×4): 81 mg via ORAL
  Filled 2018-07-21 (×5): qty 1

## 2018-07-21 MED ORDER — ENOXAPARIN SODIUM 40 MG/0.4ML ~~LOC~~ SOLN
40.0000 mg | SUBCUTANEOUS | Status: DC
  Administered 2018-07-21 – 2018-07-23 (×3): 40 mg via SUBCUTANEOUS
  Filled 2018-07-21 (×3): qty 0.4

## 2018-07-21 MED ORDER — INSULIN GLARGINE 100 UNIT/ML SOLOSTAR PEN: 15.0000 [IU] | PEN_INJECTOR | Freq: Every day | SUBCUTANEOUS | Status: DC

## 2018-07-21 MED ORDER — INSULIN REGULAR HUMAN 100 UNIT/ML IJ SOLN
0.0000 [IU] | Freq: Three times a day (TID) | INTRAMUSCULAR | Status: DC
  Administered 2018-07-21: 7 [IU] via SUBCUTANEOUS
  Administered 2018-07-21 – 2018-07-22 (×3): 5 [IU] via SUBCUTANEOUS
  Administered 2018-07-22: 3 [IU] via SUBCUTANEOUS
  Administered 2018-07-22: 18:00:00 5 [IU] via SUBCUTANEOUS
  Administered 2018-07-23: 18:00:00 3 [IU] via SUBCUTANEOUS
  Administered 2018-07-23: 5 [IU] via SUBCUTANEOUS
  Administered 2018-07-23 – 2018-07-24 (×2): 3 [IU] via SUBCUTANEOUS
  Filled 2018-07-21: qty 10

## 2018-07-21 MED ORDER — INSULIN ASPART 100 UNIT/ML ~~LOC~~ SOLN: 7.0000 [IU] | Freq: Three times a day (TID) | SUBCUTANEOUS | Status: DC

## 2018-07-21 MED ORDER — INSULIN REGULAR HUMAN 100 UNIT/ML IJ SOLN
3.0000 [IU] | Freq: Three times a day (TID) | INTRAMUSCULAR | Status: DC
  Administered 2018-07-21 – 2018-07-22 (×4): 3 [IU] via SUBCUTANEOUS
  Filled 2018-07-21: qty 10

## 2018-07-21 MED ORDER — ONDANSETRON HCL 4 MG PO TABS: 4.0000 mg | ORAL_TABLET | Freq: Four times a day (QID) | ORAL | Status: DC | PRN

## 2018-07-21 MED ORDER — VANCOMYCIN HCL IN DEXTROSE 1-5 GM/200ML-% IV SOLN
1000.0000 mg | Freq: Three times a day (TID) | INTRAVENOUS | Status: DC
  Filled 2018-07-21 (×2): qty 200

## 2018-07-21 MED ORDER — ONDANSETRON HCL 4 MG/2ML IJ SOLN: 4.0000 mg | Freq: Four times a day (QID) | INTRAMUSCULAR | Status: DC | PRN

## 2018-07-21 MED ORDER — SODIUM CHLORIDE 0.9 % IV SOLN
INTRAVENOUS | Status: DC
  Administered 2018-07-21: 02:00:00 via INTRAVENOUS

## 2018-07-21 MED ORDER — SODIUM CHLORIDE 0.9 % IV SOLN
2.0000 g | Freq: Three times a day (TID) | INTRAVENOUS | Status: DC
  Administered 2018-07-21 – 2018-07-22 (×4): 2 g via INTRAVENOUS
  Filled 2018-07-21 (×7): qty 2

## 2018-07-21 MED ORDER — SODIUM CHLORIDE 0.9 % IV SOLN
2.0000 g | Freq: Three times a day (TID) | INTRAVENOUS | Status: DC
  Filled 2018-07-21 (×2): qty 2

## 2018-07-21 MED ORDER — BISOPROLOL FUMARATE 5 MG PO TABS
5.0000 mg | ORAL_TABLET | Freq: Once | ORAL | Status: AC
  Administered 2018-07-21: 06:00:00 5 mg via ORAL
  Filled 2018-07-21: qty 1

## 2018-07-21 MED ORDER — SODIUM CHLORIDE 0.9 % IV SOLN
INTRAVENOUS | Status: AC
  Administered 2018-07-21: 03:00:00 via INTRAVENOUS

## 2018-07-21 MED ORDER — VANCOMYCIN HCL IN DEXTROSE 1-5 GM/200ML-% IV SOLN
1000.0000 mg | Freq: Three times a day (TID) | INTRAVENOUS | Status: DC
  Administered 2018-07-21 – 2018-07-22 (×4): 1000 mg via INTRAVENOUS
  Filled 2018-07-21 (×7): qty 200

## 2018-07-21 MED ORDER — OXYCODONE-ACETAMINOPHEN 5-325 MG PO TABS
1.0000 | ORAL_TABLET | Freq: Four times a day (QID) | ORAL | Status: DC | PRN
  Administered 2018-07-21 – 2018-07-24 (×13): 1 via ORAL
  Filled 2018-07-21 (×14): qty 1

## 2018-07-21 MED ORDER — SODIUM CHLORIDE 0.9 % IV SOLN: INTRAVENOUS | Status: DC

## 2018-07-21 MED ORDER — CARVEDILOL 3.125 MG PO TABS
3.1250 mg | ORAL_TABLET | Freq: Two times a day (BID) | ORAL | Status: DC
  Administered 2018-07-21 – 2018-07-24 (×6): 3.125 mg via ORAL
  Filled 2018-07-21 (×6): qty 1

## 2018-07-21 MED ORDER — INSULIN GLARGINE 100 UNIT/ML ~~LOC~~ SOLN
15.0000 [IU] | Freq: Every day | SUBCUTANEOUS | Status: DC
  Filled 2018-07-21: qty 0.15

## 2018-07-21 MED ORDER — CLOPIDOGREL BISULFATE 75 MG PO TABS
75.0000 mg | ORAL_TABLET | Freq: Every day | ORAL | Status: DC
  Administered 2018-07-21 – 2018-07-24 (×4): 75 mg via ORAL
  Filled 2018-07-21 (×5): qty 1

## 2018-07-21 MED ORDER — ACETAMINOPHEN 325 MG PO TABS
650.0000 mg | ORAL_TABLET | Freq: Four times a day (QID) | ORAL | Status: DC | PRN
  Administered 2018-07-21: 05:00:00 650 mg via ORAL
  Filled 2018-07-21: qty 2

## 2018-07-21 MED ORDER — SACUBITRIL-VALSARTAN 24-26 MG PO TABS
1.0000 | ORAL_TABLET | Freq: Two times a day (BID) | ORAL | Status: DC
  Administered 2018-07-21: 1 via ORAL
  Filled 2018-07-21 (×2): qty 1

## 2018-07-21 MED ORDER — INSULIN LISPRO 100 UNIT/ML (KWIKPEN): 7.0000 [IU] | PEN_INJECTOR | Freq: Three times a day (TID) | SUBCUTANEOUS | Status: DC

## 2018-07-21 MED ORDER — ACETAMINOPHEN 650 MG RE SUPP: 650.0000 mg | Freq: Four times a day (QID) | RECTAL | Status: DC | PRN

## 2018-07-21 MED ORDER — ALUM & MAG HYDROXIDE-SIMETH 200-200-20 MG/5ML PO SUSP
30.0000 mL | ORAL | Status: DC | PRN
  Administered 2018-07-21 (×2): 30 mL via ORAL
  Filled 2018-07-21 (×2): qty 30

## 2018-07-21 MED ORDER — INSULIN GLARGINE 100 UNIT/ML ~~LOC~~ SOLN
15.0000 [IU] | Freq: Every day | SUBCUTANEOUS | Status: DC
  Administered 2018-07-21 – 2018-07-22 (×2): 15 [IU] via SUBCUTANEOUS
  Filled 2018-07-21 (×2): qty 0.15

## 2018-07-21 MED ORDER — PANTOPRAZOLE SODIUM 40 MG PO TBEC
40.0000 mg | DELAYED_RELEASE_TABLET | Freq: Every day | ORAL | Status: DC
  Administered 2018-07-21 – 2018-07-24 (×4): 40 mg via ORAL
  Filled 2018-07-21 (×5): qty 1

## 2018-07-21 MED ORDER — FLUTICASONE FUROATE-VILANTEROL 200-25 MCG/INH IN AEPB
1.0000 | INHALATION_SPRAY | Freq: Every day | RESPIRATORY_TRACT | Status: DC
  Administered 2018-07-21 – 2018-07-24 (×4): 1 via RESPIRATORY_TRACT
  Filled 2018-07-21: qty 28

## 2018-07-21 MED ORDER — ROSUVASTATIN CALCIUM 10 MG PO TABS
5.0000 mg | ORAL_TABLET | Freq: Every day | ORAL | Status: DC
  Administered 2018-07-21 – 2018-07-24 (×4): 5 mg via ORAL
  Filled 2018-07-21 (×5): qty 1

## 2018-07-21 MED FILL — Insulin Regular (Human) Inj 100 Unit/ML: INTRAMUSCULAR | Qty: 0.05 | Status: AC

## 2018-07-21 MED FILL — Insulin Regular (Human) Inj 100 Unit/ML: INTRAMUSCULAR | Qty: 0.03 | Status: AC

## 2018-07-21 NOTE — H&P (Signed)
[facility] NAME: [name]    MR#:  [id number]  DATE OF BIRTH:  1961-01-28  DATE OF ADMISSION:  07/20/2018  PRIMARY CARE PHYSICIAN: [name], MD   REQUESTING/REFERRING PHYSICIAN: Cherylann Banas, MD  CHIEF COMPLAINT:   Chief Complaint  Patient presents with  . Fever  . Nausea    HISTORY OF PRESENT ILLNESS:  [name]  is a 57 y.o. male who presents with chief complaint as above.  Patient presents from home with fever, chills, nausea.  He was admitted to our hospital about 2 weeks ago with sepsis from colitis.  He went home and finish his antibiotic course.  He states that he was feeling somewhat better, but for the past couple days is started feeling worse again.  Today he had fever as described above.  Here in the ED he was found to meet sepsis criteria again.  Hospitalist were called for admission  PAST MEDICAL HISTORY:   Past Medical History:  Diagnosis Date  . Allergy    dust, seasonal (worse in the fall).  . 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 Followed by Pain Specialist in [place], back and neck  . Asthma    BRONCHITIS  . Cataract    First Dx in 2012  . Chronic combined systolic and diastolic congestive heart failure ([place])    a. 03/2018 Echo: EF 30-35%, ant, antlat, apical AK, Gr1 DD.  [name] Coronary artery disease    a. Prior Ant MI->s/p multiple stents placed in the LAD and right coronary artery ([place]); b. 2016 Cath: reportedly nonobs dzs;  c. 02/2017 MV: EF 45-54%, ap/ant, ap/inf, apical infarct, no ishcemia; c. 04/2018 Cath/PCI: LM nl, LAD 20p, patent mid stent, LCX 52m(3.25x15 Sierra DES), OM1 nl, OM2 50, OM3 40 w/ patent stent, RCA 40p, 21m, 40d w/ patent stent in RPDA, RPAV 60, EF 25-35%. 2+MR.  . Diabetes mellitus without complication ([place])    TYPE 2  . Diabetic peripheral neuropathy (HCC)    feet and hands  . GERD (gastroesophageal reflux disease)   . Headache    muscle tension  . Hyperlipidemia    . Hypertension    CONTROLLED ON MEDS  . Insomnia   . Ischemic cardiomyopathy    a. 03/2018 Echo: EF 30-35%, ant, antlat, apical AK, Gr1 DD, mild MR, mildly dil LA.  [name] Lyme disease    Chronic  . Myasthenia gravis ([place])   . Myocardial infarction ([place]) 2010  . Seasonal allergies   . Sleep apnea    CPAP     PAST SURGICAL HISTORY:   Past Surgical History:  Procedure Laterality Date  . BILATERAL CARPAL TUNNEL RELEASE Bilateral L in 2012 and R in 2013  . CARDIAC CATHETERIZATION     Several Caths, most recent in  March 2016.  [name] COLONOSCOPY WITH PROPOFOL N/A 01/10/2016   Procedure: COLONOSCOPY WITH PROPOFOL;  Surgeon: [name], MD;  Location: [facility];  Service: Endoscopy;  Laterality: N/A;  . CORONARY ANGIOPLASTY    . CORONARY STENT INTERVENTION N/A 04/25/2018   Procedure: CORONARY STENT INTERVENTION;  Surgeon: [name], MD;  Location: [facility];  Service: Cardiovascular;  Laterality: N/A;  . ESOPHAGOGASTRODUODENOSCOPY (EGD) WITH PROPOFOL N/A 01/10/2016   Procedure: ESOPHAGOGASTRODUODENOSCOPY (EGD) WITH PROPOFOL;  Surgeon: [name], MD;  Location: [facility] END[facility];  Service: Endoscopy;  Laterality: N/A;  . EYE SURGERY Bilateral 2012   cataract/bilateral vitrectomies  . LEFT HEART CATH AND CORONARY ANGIOGRAPHY  Left 04/25/2018   Procedure: LEFT HEART CATH AND CORONARY ANGIOGRAPHY;  Surgeon: [name], MD;  Location: [facility];  Service: Cardiovascular;  Laterality: Left;  . TONSILLECTOMY AND ADENOIDECTOMY     As a child  . TUNNELED VENOUS CATHETER PLACEMENT     removed     SOCIAL HISTORY:   Social History   Tobacco Use  . Smoking status: Never Smoker  . Smokeless tobacco: Never Used  . Tobacco comment: smoking cessation materials not required  Substance Use Topics  . Alcohol use: Not Currently    Alcohol/week: 0.0 standard drinks     FAMILY HISTORY:   Family History  Problem Relation Age of Onset  . Diabetes Mother   . Heart  disease Mother   . Cancer Father        Prostate CA  . Diabetes Brother   . Healthy Brother   . Healthy Brother      DRUG ALLERGIES:   Allergies  Allergen Reactions  . Metoprolol Rash  . Novolog [Insulin Aspart] Hives    MEDICATIONS AT HOME:   Prior to Admission medications   Medication Sig Start Date End Date Taking? Authorizing Provider  albuterol (PROVENTIL HFA;VENTOLIN HFA) 108 (90 Base) MCG/ACT inhaler Inhale 2 puffs into the lungs every 6 (six) hours as needed for wheezing or shortness of breath.   Yes [provider]  albuterol (PROVENTIL) (2.5 MG/3ML) 0.083% nebulizer solution Take 2.5 mg by nebulization every 6 (six) hours as needed for wheezing or shortness of breath.   Yes [provider]  aspirin EC 81 MG tablet Take 1 tablet (81 mg total) by mouth daily. 03/28/18  Yes Theora Gianotti, NP  BREO ELLIPTA 200-25 MCG/INH AEPB INHALE 1 PUFF INTO THE LUNGS DAILY Patient taking differently: Inhale 1 puff into the lungs daily.  06/08/18  Yes Poulose, Bethel Born, NP  carvedilol (COREG) 6.25 MG tablet Take 1 tablet (6.25 mg total) by mouth 2 (two) times daily. 05/12/18  Yes [name], MD  clopidogrel (PLAVIX) 75 MG tablet Take 1 tablet (75 mg total) by mouth daily with breakfast. 04/27/18  Yes Theora Gianotti, NP  cyanocobalamin 1000 MCG tablet Take 1,000 mcg by mouth daily.    Yes [provider]  dexlansoprazole (DEXILANT) 60 MG capsule Take 1 capsule (60 mg total) by mouth daily. 06/29/18  Yes [name], MD  gabapentin (NEURONTIN) 300 MG capsule Take 1 capsule (300 mg total) by mouth 3 (three) times daily. 09/25/15  Yes Rochel Brome A, MD  insulin lispro (HUMALOG KWIKPEN) 100 UNIT/ML KiwkPen Inject 7-8 Units into the skin 3 (three) times daily. Reported on 12/02/2015/ sliding scale 1 unit for every 8 units of carbs; and 1 unit for every 20 above 120   Yes [provider]  Insulin Pen Needle (FIFTY50 PEN NEEDLES) 32G  X 4 MM MISC Inject 1 each into the skin as needed. 02/24/18  Yes [provider]  LANTUS SOLOSTAR 100 UNIT/ML Solostar Pen Inject 25-30 Units into the skin daily at 10 pm.  03/25/16  Yes Sherri Sear, MD  levocetirizine (XYZAL) 5 MG tablet Take 1 tablet (5 mg total) by mouth every evening. 06/07/18  Yes Poulose, Bethel Born, NP  liraglutide (VICTOZA) 18 MG/3ML SOPN 1.8 units every morning Patient taking differently: Inject 1.8 mg into the skin daily.  04/26/18  Yes Theora Gianotti, NP  loratadine (CLARITIN) 10 MG tablet Take 10 mg by mouth daily.  Yes [provider]  metFORMIN (GLUCOPHAGE) 1000 MG tablet Take 1,000 mg by mouth 2 (two) times daily with a meal.   Yes Sherri Sear, MD  rosuvastatin (CRESTOR) 5 MG tablet Take 5 mg by mouth daily. 11/20/17  Yes [name], MD  sacubitril-valsartan (ENTRESTO) 24-26 MG Take 1 tablet by mouth 2 (two) times daily. 04/26/18  Yes Theora Gianotti, NP  ezetimibe (ZETIA) 10 MG tablet Take 1 tablet (10 mg total) by mouth daily. 04/11/18 07/10/18  Theora Gianotti, NP  levofloxacin (LEVAQUIN) 500 MG tablet Take 500 mg by mouth daily.    [provider]  metroNIDAZOLE (FLAGYL) 500 MG tablet Take 500 mg by mouth 3 (three) times daily.    [provider]  naloxone [facility]) nasal spray 4 mg/0.1 mL For excess sedation from opioids 09/15/16   Molli Barrows, MD  nitroGLYCERIN (NITROSTAT) 0.3 MG SL tablet Place 1 tablet (0.3 mg total) under the tongue every 5 (five) minutes as needed for chest pain. Reported on 12/02/2015 01/25/17   [name], MD    REVIEW OF SYSTEMS:  Review of Systems  Constitutional: Positive for chills, fever and malaise/fatigue. Negative for weight loss.  HENT: Negative for ear pain, hearing loss and tinnitus.   Eyes: Negative for blurred vision, double vision, pain and redness.  Respiratory: Negative for cough, hemoptysis and shortness of breath.    Cardiovascular: Negative for chest pain, palpitations, orthopnea and leg swelling.  Gastrointestinal: Negative for abdominal pain, constipation, diarrhea, nausea and vomiting.  Genitourinary: Negative for dysuria, frequency and hematuria.  Musculoskeletal: Negative for back pain, joint pain and neck pain.  Skin:       No acne, rash, or lesions  Neurological: Negative for dizziness, tremors, focal weakness and weakness.  Endo/Heme/Allergies: Negative for polydipsia. Does not bruise/bleed easily.  Psychiatric/Behavioral: Negative for depression. The patient is not nervous/anxious and does not have insomnia.      VITAL SIGNS:   Vitals:   07/20/18 2200 07/20/18 2216 07/20/18 2230 07/20/18 2300  BP: 133/68  127/67 (!) 98/45  Pulse: (!) 140  (!) 139 (!) 138  Resp: (!) 24  (!) 27 19  Temp:  98.9 F (37.2 C)    TempSrc:  Oral    SpO2: 95%  94% 93%  Weight:      Height:       Wt Readings from Last 3 Encounters:  07/20/18 114.6 kg  07/13/18 108.3 kg  07/07/18 103.9 kg    PHYSICAL EXAMINATION:  Physical Exam  Vitals reviewed. Constitutional: He is oriented to person, place, and time. He appears well-developed and well-nourished. No distress.  HENT:  Head: Normocephalic and atraumatic.  Mouth/Throat: Oropharynx is clear and moist.  Eyes: Pupils are equal, round, and reactive to light. Conjunctivae and EOM are normal. No scleral icterus.  Neck: Normal range of motion. Neck supple. No JVD present. No thyromegaly present.  Cardiovascular: Regular rhythm and intact distal pulses. Exam reveals no gallop and no friction rub.  No murmur heard. tachycardic  Respiratory: Effort normal and breath sounds normal. No respiratory distress. He has no wheezes. He has no rales.  GI: Soft. Bowel sounds are normal. He exhibits no distension. There is no tenderness.  Musculoskeletal: Normal range of motion. He exhibits no edema.  No arthritis, no gout  Lymphadenopathy:    He has no cervical  adenopathy.  Neurological: He is alert and oriented to person, place, and time. No cranial nerve deficit.  No dysarthria, no  aphasia  Skin: Skin is warm and dry. No rash noted. No erythema.  Psychiatric: He has a normal mood and affect. His behavior is normal. Judgment and thought content normal.    LABORATORY PANEL:   CBC Recent Labs  Lab 07/20/18 2053  WBC 8.8  HGB 11.2*  HCT 34.6*  PLT 303   ------------------------------------------------------------------------------------------------------------------  Chemistries  Recent Labs  Lab 07/20/18 2053  NA 134*  K 4.2  CL 100  CO2 23  GLUCOSE 295*  BUN 13  CREATININE 0.91  CALCIUM 8.6*  AST 37  ALT 25  ALKPHOS 70  BILITOT 0.7   ------------------------------------------------------------------------------------------------------------------  Cardiac Enzymes Recent Labs  Lab 07/20/18 2053  TROPONINI 0.07*   ------------------------------------------------------------------------------------------------------------------  RADIOLOGY:  Dg Chest Port 1 View  Result Date: 07/20/2018 CLINICAL DATA:  Fever. EXAM: PORTABLE CHEST 1 VIEW COMPARISON:  07/09/2018 FINDINGS: Shallow inspiration. Heart size and pulmonary vascularity are normal. Lungs are clear. No blunting of costophrenic angles. No pneumothorax. Mediastinal contours appear intact. IMPRESSION: No active disease. Electronically Signed   By: Lucienne Capers M.D.   On: 07/20/2018 21:56    EKG:   Orders placed or performed in visit on 07/20/18  . EKG 12-Lead    IMPRESSION AND PLAN:  Principal Problem:   Sepsis ([place]) -IV antibiotics initiated, lactic acid was elevated initially, IV fluids started and will trend lactate until within normal limits, absolute source of infection is unclear upfront, though strongly suspect possible bloodstream infection given his recent treatment for colitis versus recurrent intra-abdominal process.  Cultures sent Active  Problems:   Coronary artery disease -continue home meds   DM (diabetes mellitus), type 2, uncontrolled with complications (HCC) -sliding scale insulin with corresponding glucose checks   Hypertension -patient is currently borderline hypotensive, hold antihypertensives for now   Chronic combined systolic and diastolic CHF (congestive heart failure) (HCC) -stable, lungs are clear, IV fluids for resuscitation as above, cautious administration, continue home meds   GERD (gastroesophageal reflux disease) -Home dose PPI   Hyperlipidemia -Home dose antilipid   OSA on CPAP -CPAP nightly  Chart review performed and case discussed with ED provider. Labs, imaging and/or ECG reviewed by provider and discussed with patient/family. Management plans discussed with the patient and/or family.  DVT PROPHYLAXIS: SubQ lovenox   GI PROPHYLAXIS:  PPI   ADMISSION STATUS: Inpatient     CODE STATUS: Full Code Status History    Date Active Date Inactive Code Status Order ID Comments User Context   07/08/2018 0449 07/13/2018 1559 Full Code [id number]  Lance Coon, MD ED   04/25/2018 1517 04/26/2018 1417 Full Code [id number]  [name], MD Inpatient    Advance Directive Documentation     Most Recent Value  Type of Advance Directive  Living will  Pre-existing out of facility DNR order (yellow form or pink MOST form)  -  "MOST" Form in Place?  -      TOTAL TIME TAKING CARE OF THIS PATIENT: 45 minutes.   [name] 07/21/2018, 12:34 AM  [facility]  Office  [phone]  CC: Primary care physician; [name], MD  Note:  This document was prepared using Dragon voice recognition software and may include unintentional dictation errors.

## 2018-07-21 NOTE — ED Notes (Signed)
Attempt to call report unsuccessful. RN in with another pt. Will call back.

## 2018-07-21 NOTE — Progress Notes (Signed)
ID Full consult note to follow H/o Myasthenia On mestinon ( took azathioprine for 2 days) DM, CAD, last stent in July 2019 Treated lyme ( had 1 year of IV ceftriaxone when he lived in [place]) Admitted with fever Was recently in hospital with fever and chills and treated as colitis No clear source for fever- bacterial VS viral   VS non infectious cause UA - no WBC CXR -no pneumonia Throat NAD Check  resp viral PCR Check post void bladder scan May need to repeat  CT abdomen May have to do work up for FUO if cultures and other test do not give [place] the answer

## 2018-07-21 NOTE — ED Notes (Signed)
ED TO INPATIENT HANDOFF REPORT  Name/Age/Gender [name] 57 y.o. male  Code Status Code Status History    Date Active Date Inactive Code Status Order ID Comments User Context   07/08/2018 0449 07/13/2018 1559 Full Code [id number]  Lance Coon, MD ED   04/25/2018 1517 04/26/2018 1417 Full Code [id number]  [name], MD Inpatient    Advance Directive Documentation     Most Recent Value  Type of Advance Directive  Living will  Pre-existing out of facility DNR order (yellow form or pink MOST form)  -  "MOST" Form in Place?  -      Home/SNF/Other Home  Chief Complaint EMS  Level of Care/Admitting Diagnosis ED Disposition    ED Disposition Condition Colon: [place] [[id number]]  Level of Care: Med-Surg [16]  Diagnosis: Sepsis [facility]) [[id number]]  Admitting Physician: Lance Coon [[id number]]  Attending Physician: Lance Coon [phone]  Estimated length of stay: past midnight tomorrow  Certification:: I certify this patient will need inpatient services for at least 2 midnights  PT Class (Do Not Modify): Inpatient [101]  PT Acc Code (Do Not Modify): Private [1]       Medical History Past Medical History:  Diagnosis Date  . Allergy    dust, seasonal (worse in the fall).  . 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 Followed by Pain Specialist in [place], back and neck  . Asthma    BRONCHITIS  . Cataract    First Dx in 2012  . Chronic combined systolic and diastolic congestive heart failure ([place])    a. 03/2018 Echo: EF 30-35%, ant, antlat, apical AK, Gr1 DD.  [name] Coronary artery disease    a. Prior Ant MI->s/p multiple stents placed in the LAD and right coronary artery ([place]); b. 2016 Cath: reportedly nonobs dzs;  c. 02/2017 MV: EF 45-54%, ap/ant, ap/inf, apical infarct, no ishcemia; c. 04/2018 Cath/PCI: LM nl, LAD 20p, patent mid stent, LCX 9m3.25x15 Sierra DES), OM1 nl, OM2 50, OM3 40 w/ patent stent, RCA 40p, 218m40d w/ patent  stent in RPDA, RPAV 60, EF 25-35%. 2+MR.  . Diabetes mellitus without complication ([place]   TYPE 2  . Diabetic peripheral neuropathy (HCC)    feet and hands  . GERD (gastroesophageal reflux disease)   . Headache    muscle tension  . Hyperlipidemia   . Hypertension    CONTROLLED ON MEDS  . Insomnia   . Ischemic cardiomyopathy    a. 03/2018 Echo: EF 30-35%, ant, antlat, apical AK, Gr1 DD, mild MR, mildly dil LA.  . Marland Kitchenyme disease    Chronic  . Myasthenia gravis ([place]  . Myocardial infarction ([facility]2010  . Seasonal allergies   . Sleep apnea    CPAP    Allergies Allergies  Allergen Reactions  . Metoprolol Rash  . Novolog [Insulin Aspart] Hives    IV Location/Drains/Wounds Patient Lines/Drains/Airways Status   Active Line/Drains/Airways    Name:   Placement date:   Placement time:   Site:   Days:   Peripheral IV 07/20/18 Right Antecubital   07/20/18    2031    Antecubital   1   Peripheral IV 07/20/18 Right Forearm   07/20/18    2045    Forearm   1   Peripheral IV 07/20/18 Right Hand   07/20/18    2050    Hand   1  Labs/Imaging Results for orders placed or performed during the hospital encounter of 07/20/18 (from the past 48 hour(s))  Lactic acid, plasma     Status: Abnormal   Collection Time: 07/20/18  8:53 PM  Result Value Ref Range   Lactic Acid, Venous 3.1 (HH) 0.5 - 1.9 mmol/L    Comment: CRITICAL RESULT CALLED TO, READ BACK BY AND VERIFIED WITH MEGAN JONES ON 07/20/18 AT 2136 JAG Performed at [facility], [address] [place], [place] [address]   Comprehensive metabolic panel     Status: Abnormal   Collection Time: 07/20/18  8:53 PM  Result Value Ref Range   Sodium 134 (L) 135 - 145 mmol/L   Potassium 4.2 3.5 - 5.1 mmol/L   Chloride 100 98 - 111 mmol/L   CO2 23 22 - 32 mmol/L   Glucose, Bld 295 (H) 70 - 99 mg/dL   BUN 13 6 - 20 mg/dL   Creatinine, Ser 0.91 0.61 - 1.24 mg/dL   Calcium 8.6 (L) 8.9 - 10.3 mg/dL   Total Protein 6.0 (L)  6.5 - 8.1 g/dL   Albumin 3.2 (L) 3.5 - 5.0 g/dL   AST 37 15 - 41 U/L   ALT 25 0 - 44 U/L   Alkaline Phosphatase 70 38 - 126 U/L   Total Bilirubin 0.7 0.3 - 1.2 mg/dL   GFR calc non Af Amer >60 >60 mL/min   GFR calc Af Amer >60 >60 mL/min    Comment: (NOTE) The eGFR has been calculated using the CKD EPI equation. This calculation has not been validated in all clinical situations. eGFR's persistently <60 mL/min signify possible Chronic Kidney Disease.    Anion gap 11 5 - 15    Comment: Performed at [facility], [address] [place], [place] [address]  Lipase, blood     Status: None   Collection Time: 07/20/18  8:53 PM  Result Value Ref Range   Lipase 21 11 - 51 U/L    Comment: Performed at [facility], [address] [place], [place] [address]  Troponin I     Status: Abnormal   Collection Time: 07/20/18  8:53 PM  Result Value Ref Range   Troponin I 0.07 (HH) <0.03 ng/mL    Comment: CRITICAL RESULT CALLED TO, READ BACK BY AND VERIFIED WITH MEGAN JONES ON 07/20/18 AT 2010 JAG Performed at [facility], [address] [place], [place] [address]   CBC WITH DIFFERENTIAL     Status: Abnormal   Collection Time: 07/20/18  8:53 PM  Result Value Ref Range   WBC 8.8 4.0 - 10.5 K/uL   RBC 3.88 (L) 4.22 - 5.81 MIL/uL   Hemoglobin 11.2 (L) 13.0 - 17.0 g/dL   HCT 34.6 (L) 39.0 - 52.0 %   MCV 89.2 80.0 - 100.0 fL   MCH 28.9 26.0 - 34.0 pg   MCHC 32.4 30.0 - 36.0 g/dL   RDW 15.1 11.5 - 15.5 %   Platelets 303 150 - 400 K/uL   nRBC 0.0 0.0 - 0.2 %   Neutrophils Relative % 92 %   Neutro Abs 8.1 (H) 1.7 - 7.7 K/uL   Lymphocytes Relative 2 %   Lymphs Abs 0.2 (L) 0.7 - 4.0 K/uL   Monocytes Relative 5 %   Monocytes Absolute 0.4 0.1 - 1.0 K/uL   Eosinophils Relative 0 %   Eosinophils Absolute 0.0 0.0 - 0.5 K/uL   Basophils Relative 0 %   Basophils Absolute 0.0  0.0 - 0.1 K/uL   Immature Granulocytes 1 %   Abs Immature Granulocytes 0.06 0.00 - 0.07 K/uL     Comment: Performed at [facility], [address] [place], [place] [address]  Procalcitonin     Status: None   Collection Time: 07/20/18  8:53 PM  Result Value Ref Range   Procalcitonin 0.51 ng/mL    Comment:        Interpretation: PCT > 0.5 ng/mL and <= 2 ng/mL: Systemic infection (sepsis) is possible, but other conditions are known to elevate PCT as well. (NOTE)       Sepsis PCT Algorithm           Lower Respiratory Tract                                      Infection PCT Algorithm    ----------------------------     ----------------------------         PCT < 0.25 ng/mL                PCT < 0.10 ng/mL         Strongly encourage             Strongly discourage   discontinuation of antibiotics    initiation of antibiotics    ----------------------------     -----------------------------       PCT 0.25 - 0.50 ng/mL            PCT 0.10 - 0.25 ng/mL               OR       >80% decrease in PCT            Discourage initiation of                                            antibiotics      Encourage discontinuation           of antibiotics    ----------------------------     -----------------------------         PCT >= 0.50 ng/mL              PCT 0.26 - 0.50 ng/mL                AND       <80% decrease in PCT             Encourage initiation of                                             antibiotics       Encourage continuation           of antibiotics    ----------------------------     -----------------------------        PCT >= 0.50 ng/mL                  PCT > 0.50 ng/mL               AND         increase in PCT  Strongly encourage                                      initiation of antibiotics    Strongly encourage escalation           of antibiotics                                     -----------------------------                                           PCT <= 0.25 ng/mL                                                 OR                                         > 80% decrease in PCT                                     Discontinue / Do not initiate                                             antibiotics Performed at [facility], [address] [place], [place] [address]   Urinalysis, Complete w Microscopic     Status: Abnormal   Collection Time: 07/20/18  8:53 PM  Result Value Ref Range   Color, Urine YELLOW (A) YELLOW   APPearance CLEAR (A) CLEAR   Specific Gravity, Urine 1.027 1.005 - 1.030   pH 5.0 5.0 - 8.0   Glucose, UA >=500 (A) NEGATIVE mg/dL   Hgb urine dipstick SMALL (A) NEGATIVE   Bilirubin Urine NEGATIVE NEGATIVE   Ketones, ur 5 (A) NEGATIVE mg/dL   Protein, ur 100 (A) NEGATIVE mg/dL   Nitrite NEGATIVE NEGATIVE   Leukocytes, UA NEGATIVE NEGATIVE   RBC / HPF 0-5 0 - 5 RBC/hpf   WBC, UA 0-5 0 - 5 WBC/hpf   Bacteria, UA NONE SEEN NONE SEEN   Squamous Epithelial / LPF NONE SEEN 0 - 5    Comment: Performed at [facility], [address] [place], [place] [address]  Influenza panel by PCR (type A & B)     Status: None   Collection Time: 07/20/18  8:53 PM  Result Value Ref Range   Influenza A By PCR NEGATIVE NEGATIVE   Influenza B By PCR NEGATIVE NEGATIVE    Comment: (NOTE) The Xpert Xpress Flu assay is intended as an aid in the diagnosis of  influenza and should not be used as a sole basis for treatment.  This  assay is FDA approved for nasopharyngeal swab specimens only. Nasal  washings and aspirates are unacceptable for Xpert Xpress Flu testing. Performed at [facility], [address] [place], [place] [address]   Lactic acid, plasma     Status: Abnormal   Collection Time: 07/20/18 10:40 PM  Result Value Ref Range   Lactic Acid, Venous 2.6 (HH) 0.5 - 1.9 mmol/L    Comment: CRITICAL RESULT CALLED TO, READ BACK BY AND VERIFIED WITH RACQUEL DAVID ON 07/20/18 AT 2320 JAG Performed at [facility], [address] [place], [place] [address]    Dg Chest Port 1  View  Result Date: 07/20/2018 CLINICAL DATA:  Fever. EXAM: PORTABLE CHEST 1 VIEW COMPARISON:  07/09/2018 FINDINGS: Shallow inspiration. Heart size and pulmonary vascularity are normal. Lungs are clear. No blunting of costophrenic angles. No pneumothorax. Mediastinal contours appear intact. IMPRESSION: No active disease. Electronically Signed   By: Lucienne Capers M.D.   On: 07/20/2018 21:56    Pending Labs Unresulted Labs (From admission, onward)    Start     Ordered   07/20/18 2041  Blood Culture (routine x 2)  BLOOD CULTURE X 2,   STAT     07/20/18 2040   Signed and Held  CBC  (enoxaparin (LOVENOX)    CrCl >/= 30 ml/min)  Once,   R    Comments:  Baseline for enoxaparin therapy IF NOT ALREADY DRAWN.  Notify MD if PLT < 100 K.    Signed and Held   Signed and Held  Creatinine, serum  (enoxaparin (LOVENOX)    CrCl >/= 30 ml/min)  Once,   R    Comments:  Baseline for enoxaparin therapy IF NOT ALREADY DRAWN.    Signed and Held   Signed and Held  Creatinine, serum  (enoxaparin (LOVENOX)    CrCl >/= 30 ml/min)  Weekly,   R    Comments:  while on enoxaparin therapy    Signed and Held   Signed and Held  Troponin I  Now then every 6 hours,   R     Signed and Held   Signed and Held  Basic metabolic panel  Tomorrow morning,   R     Signed and Held   Signed and Held  CBC  Tomorrow morning,   R     Signed and Held   Signed and Held  Lactic acid, plasma  Tomorrow morning,   STAT     Signed and Held          Vitals/Pain Today's Vitals   07/20/18 2300 07/20/18 2330 07/21/18 0000 07/21/18 0030  BP: (!) 98/45 93/62 101/65 118/67  Pulse: (!) 138 (!) 137 (!) 135 (!) 134  Resp: 19 20 (!) 27 (!) 25  Temp:      TempSrc:      SpO2: 93% 93% 97% 95%  Weight:      Height:      PainSc:        Isolation Precautions No active isolations  Medications Medications  acetaminophen (TYLENOL) tablet 1,000 mg (1,000 mg Oral Given 07/20/18 2101)  sodium chloride 0.9 % bolus 1,000 mL ( Intravenous  Rate/Dose Verify 07/20/18 2209)  sodium chloride 0.9 % bolus 1,000 mL (0 mLs Intravenous Stopped 07/20/18 2216)  ceFEPIme (MAXIPIME) 2 g in sodium chloride 0.9 % 100 mL IVPB ( Intravenous Stopped 07/20/18 2326)  vancomycin (VANCOCIN) IVPB 1000 mg/200 mL premix (0 mg Intravenous Stopped 07/21/18 0040)    Mobility walks

## 2018-07-21 NOTE — Progress Notes (Signed)
CPAP in room. Patient refused to wear. Patient stated he has a CPAP at home but does not use it either.

## 2018-07-21 NOTE — Progress Notes (Signed)
Inpatient Diabetes Program Recommendations  AACE/ADA: New Consensus Statement on Inpatient Glycemic Control (2019)  Target Ranges:  Prepandial:   less than 140 mg/dL      Peak postprandial:   less than 180 mg/dL (1-2 hours)      Critically ill patients:  140 - 180 mg/dL   Results for [name], Edgar "[name]" (MRN [id number]) as of 07/21/2018 08:06  Ref. Range 07/21/2018 07:47  Glucose-Capillary Latest Ref Range: 70 - 99 mg/dL 280 (H)  Results for [name], [name] (MRN [id number]) as of 07/21/2018 08:06  Ref. Range 07/20/2018 20:53 07/21/2018 04:45  Glucose Latest Ref Range: 70 - 99 mg/dL 295 (H) 324 (H)  Results for [name], [name] (MRN [id number]) as of 07/21/2018 08:06  Ref. Range 06/01/2018 00:00  Hemoglobin A1C Latest Ref Range: 4.0 - 5.6 % 7.9 (A)   Review of Glycemic Control  Diabetes history: DM2 Outpatient Diabetes medications: Lantus 25-30 units QHS, Humalog 7-8 units TID (1 unit for every 8 grams of carbs and 1 unit for every 20 mg/dl >120 mg/dl), Victoza 1.8 mg daily, Metformin 1000 mg BID Current orders for Inpatient glycemic control: Lantus 15 units QHS, Novolog 7-8 units TID   Inpatient Diabetes Program Recommendations:  Insulin - Basal: Lantus was not ordered until this morning around 3:53 am so patient did NOT receive any Lantus last night. May want to consider changing frequency of Lantus to 15 units daily so that it can be given this morning. Correction (SSI): Please discontinue Novolog 7-8 units TID order. Please consider ordering Novolog 0-9 units TID with meals and Novolog 0-5 units QHS . Insulin - Meal Coverage: Please consider ordering Novolog 3 units TID with meals for meal coverage if patient eats at least 50% of meals.  Thanks, Barnie Alderman, RN, MSN, CDE Diabetes Coordinator Inpatient Diabetes Program [phone] (Team Pager from 8am to 5pm)

## 2018-07-21 NOTE — Telephone Encounter (Signed)
Left voice message on [phone] @ 9:15 asking if can schedule [name] appt with Dr Ancil Boozer on 10.24.19. Also left message on [phone] @ 9:12 asking if patient is able to be seen on 10. 24.19

## 2018-07-21 NOTE — Consult Note (Addendum)
Cardiology Consult    Patient ID: SAYF KERNER MRN: [id number], DOB/AGE: [birth year]   Admit date: 07/20/2018 Date of Consult: 07/21/2018  Primary Physician: [name], MD Primary Cardiologist: [name], MD Requesting Provider: R. Wieting, MD  Patient Profile    [name] is a 57 y.o. male with a history of CAD s/p post mulst PCI's, HTN, HL, DMII, diabetic neuropathy, myasthenia gravis, and OSA, who is being seen today for the evaluation of tachycardia and recurrent sepsis at the request of Dr. Leslye Peer.  Past Medical History   Past Medical History:  Diagnosis Date  . Allergy    dust, seasonal (worse in the fall).  . 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 Followed by Pain Specialist in [place], back and neck  . Asthma    BRONCHITIS  . Cataract    First Dx in 2012  . Chronic combined systolic and diastolic congestive heart failure ([place])    a. 03/2018 Echo: EF 30-35%, ant, antlat, apical AK, Gr1 DD; b. 07/2018 Echo: EF 35-40%, anteroseptal, apical, and ant HK. Gr1 DD.  [name] Coronary artery disease    a. Prior Ant MI->s/p multiple stents placed in the LAD and right coronary artery ([place]); b. 2016 Cath: reportedly nonobs dzs;  c. 02/2017 MV: EF 45-54%, ap/ant, ap/inf, apical infarct, no ishcemia; c. 04/2018 Cath/PCI: LM nl, LAD 20p, patent mid stent, LCX 52m(3.25x15 Sierra DES), OM1 nl, OM2 50, OM3 40 w/ patent stent, RCA 40p, 58m, 40d w/ patent stent in RPDA, RPAV 60, EF 25-35%. 2+MR.  . Diabetes mellitus without complication ([place])    TYPE 2  . Diabetic peripheral neuropathy ([facility])    feet and hands  . GERD (gastroesophageal reflux disease)   . Headache    muscle tension  . Hyperlipidemia   . Hypertension    CONTROLLED ON MEDS  . Insomnia   . Ischemic cardiomyopathy    a. 03/2018 Echo: EF 30-35%, ant, antlat, apical AK, Gr1 DD, mild MR, mildly dil LA; b. 07/2018 Echo: EF 35-40%.  . Lyme disease    Chronic  . Myasthenia gravis ([place])   . Myocardial infarction ([place])  2010  . Seasonal allergies   . Sleep apnea    CPAP    Past Surgical History:  Procedure Laterality Date  . BILATERAL CARPAL TUNNEL RELEASE Bilateral L in 2012 and R in 2013  . CARDIAC CATHETERIZATION     Several Caths, most recent in  March 2016.  [name] COLONOSCOPY WITH PROPOFOL N/A 01/10/2016   Procedure: COLONOSCOPY WITH PROPOFOL;  Surgeon: [name], MD;  Location: [facility];  Service: Endoscopy;  Laterality: N/A;  . CORONARY ANGIOPLASTY    . CORONARY STENT INTERVENTION N/A 04/25/2018   Procedure: CORONARY STENT INTERVENTION;  Surgeon: [name], MD;  Location: [facility];  Service: Cardiovascular;  Laterality: N/A;  . ESOPHAGOGASTRODUODENOSCOPY (EGD) WITH PROPOFOL N/A 01/10/2016   Procedure: ESOPHAGOGASTRODUODENOSCOPY (EGD) WITH PROPOFOL;  Surgeon: [name], MD;  Location: [facility];  Service: Endoscopy;  Laterality: N/A;  . EYE SURGERY Bilateral 2012   cataract/bilateral vitrectomies  . LEFT HEART CATH AND CORONARY ANGIOGRAPHY Left 04/25/2018   Procedure: LEFT HEART CATH AND CORONARY ANGIOGRAPHY;  Surgeon: [name], MD;  Location: [facility];  Service: Cardiovascular;  Laterality: Left;  . TONSILLECTOMY AND ADENOIDECTOMY     As a child  . TUNNELED VENOUS CATHETER PLACEMENT     removed     Allergies  Allergies  Allergen Reactions  .  Metoprolol Rash  . Novolog [Insulin Aspart] Hives    History of Present Illness    56 y/o ? with the above complex past medical history including CAD status post prior myocardial infarction 2009 with subsequent placement of a total of 10 stents between the LAD and right coronary artery.  These were all performed in [place].  Other history includes hypertension, hyperlipidemia, type 2 diabetes mellitus, diabetic neuropathy, myasthenia gravis, GERD, and sleep apnea on CPAP.  In June of this year, he reported some progression of his baseline level of dyspnea on exertion a few episodes of nitrate responsive  angina at rest.  Echocardiogram showed an EF of 30 to 35%, which was a new decrease in LV function.  Catheterization was undertaken and this showed a new 90% stenosis in the mid left circumflex with multiple patent LAD, RCA, and OM 2 stents.  The circumflex was successfully treated with a drug-eluting stent.  LVEDP was severely elevated at the time of his catheterization he was placed on Lasix and Entresto.  He did well post procedure but was admitted October 3 in the setting of disorientation with worsening fever and vomiting.  Lactate was elevated at 2.5 and subsequently rose to 3.3.  He was treated for sepsis and colitis.  During admission, he did not have chest pain or dyspnea but troponin rose to 11.48.  Follow-up echo showed persistent LV dysfunction with an EF of 30 to 35%.  We did see him during that admission and recommended consideration for repeat catheterization following full recovery.  Patient was subsequently discharged and completed a course of oral antibiotics and says he was feeling much closer to baseline.  He did have 3 episodes of nitrate responsive chest pain over the past week but says that he does have a long history of having post PCI chest pain and frequently uses nitrates.  He was in his usual state of health until the evening of October 16, when he had sudden onset of chills.  His wife checked his temperature and found him to be febrile at 103.  He was taken to the emergency department where he was hemodynamically stable with elevated heart rates in sinus tachycardia-148.  He a low-grade fever at 99.2 at that point.  Lactate was elevated at 3.1.  Procalcitonin was down from previous at 0.51.  Troponin was mildly elevated 0.072.  Chest x-ray was nonacute and WBCs were normal.  He was admitted for further evaluation and antibiotics.  His wife called our office today and reported tachycardia and most recent admission and we have been asked to evaluate.  He currently denies any chest pain  or dyspnea and is feeling somewhat better than last night.  Inpatient Medications    . aspirin EC  81 mg Oral Daily  . clopidogrel  75 mg Oral Q breakfast  . enoxaparin (LOVENOX) injection  40 mg Subcutaneous Q24H  . fluticasone furoate-vilanterol  1 puff Inhalation Daily  . insulin glargine  15 Units Subcutaneous Daily  . insulin regular  0-9 Units Subcutaneous TID WC  . insulin regular  3 Units Subcutaneous TID WC  . pantoprazole  40 mg Oral Daily  . rosuvastatin  5 mg Oral Daily  . sacubitril-valsartan  1 tablet Oral BID    Family History    Family History  Problem Relation Age of Onset  . Diabetes Mother   . Heart disease Mother   . Cancer Father        Prostate CA  . Diabetes  Brother   . Healthy Brother   . Healthy Brother    He indicated that his mother is deceased. He indicated that his father is alive. He indicated that his sister is alive. He indicated that three of his four brothers are alive.   Social History    Social History   Socioeconomic History  . Marital status: Married    Spouse name: Neoma Laming  . Number of children: 0  . Years of education: Not on file  . Highest education level: Master's degree (e.g., MA, MS, MEng, MEd, MSW, MBA)  Occupational History  . Occupation: disabled    Comment: multiple medical problems - first approved for uncontrolled DM, but now has heart disease and Myasthenia Gravis   Social Needs  . Financial resource strain: Not hard at all  . Food insecurity:    Worry: Never true    Inability: Never true  . Transportation needs:    Medical: No    Non-medical: No  Tobacco Use  . Smoking status: Never Smoker  . Smokeless tobacco: Never Used  . Tobacco comment: smoking cessation materials not required  Substance and Sexual Activity  . Alcohol use: Not Currently    Alcohol/week: 0.0 standard drinks  . Drug use: No  . Sexual activity: Not Currently  Lifestyle  . Physical activity:    Days per week: 7 days    Minutes per  session: 30 min  . Stress: Not at all  Relationships  . Social connections:    Talks on phone: More than three times a week    Gets together: Three times a week    Attends religious service: More than 4 times per year    Active member of club or organization: Yes    Attends meetings of clubs or organizations: More than 4 times per year    Relationship status: Married  . Intimate partner violence:    Fear of current or ex partner: No    Emotionally abused: No    Physically abused: No    Forced sexual activity: No  Other Topics Concern  . Not on file  Social History Narrative  . Not on file     Review of Systems    General:  +++ chills, +++ fever, no night sweats or weight changes.  Cardiovascular:  +++ Nitrate responsive chest pain, no dyspnea on exertion, edema, orthopnea, palpitations, paroxysmal nocturnal dyspnea. Dermatological: No rash, lesions/masses Respiratory: No cough, dyspnea Urologic: No hematuria, dysuria Abdominal:   No nausea, vomiting, diarrhea, bright red blood per rectum, melena, or hematemesis Neurologic:  No visual changes, wkns, changes in mental status. Musculoskeletal: Chronic pain. All other systems reviewed and are otherwise negative except as noted above.  Physical Exam    Blood pressure 100/74, pulse (!) 107, temperature 98.3 F (36.8 C), temperature source Oral, resp. rate 17, height 5\' 7"  (1.702 m), weight 107.6 kg, SpO2 97 %.  General: Pleasant, NAD Psych: Normal affect. Neuro: Alert and oriented X 3. Moves all extremities spontaneously. HEENT: Normal  Neck: Supple without bruits or JVD. Lungs:  Resp regular and unlabored, CTA. Heart: RRR no s3, s4, or murmurs. Abdomen: Soft, non-tender, non-distended, BS + x 4.  Extremities: No clubbing, cyanosis or edema. DP/PT/Radials 2+ and equal bilaterally.  Labs     Recent Labs    07/20/18 2053 07/21/18 0314 07/21/18 0923  TROPONINI 0.07* 0.07* 0.06*   Lab Results  Component Value Date    WBC 9.4 07/21/2018   HGB 9.8 (L) 07/21/2018  HCT 31.1 (L) 07/21/2018   MCV 90.1 07/21/2018   PLT 270 07/21/2018    Recent Labs  Lab 07/20/18 2053 07/21/18 0445  NA 134* 136  K 4.2 4.8  CL 100 105  CO2 23 25  BUN 13 14  CREATININE 0.91 1.15  CALCIUM 8.6* 8.0*  PROT 6.0*  --   BILITOT 0.7  --   ALKPHOS 70  --   ALT 25  --   AST 37  --   GLUCOSE 295* 324*   Lab Results  Component Value Date   CHOL 109 05/18/2018   HDL 37 05/18/2018   LDLCALC 50 05/18/2018   TRIG 112 05/18/2018    Radiology Studies    Dg Chest 1 View  Result Date: 07/07/2018 CLINICAL DATA:  Shortness of breath. Altered mental status. EXAM: CHEST  1 VIEW COMPARISON:  01/27/2018. FINDINGS: Poor inspiration. Normal sized heart. Coronary artery stent. Clear lungs with normal vascularity. Thoracic spine degenerative changes. IMPRESSION: No acute abnormality. Electronically Signed   By: Claudie Revering M.D.   On: 07/07/2018 20:48   Dg Chest 2 View  Result Date: 07/21/2018 CLINICAL DATA:  Fever nausea. Recent hospitalization for sepsis and colitis and went home on oral antibiotics. Symptoms recurred 2 days ago. EXAM: CHEST - 2 VIEW COMPARISON:  Chest x-ray of July 07, 2018 and July 20, 2018 FINDINGS: The lungs are mildly hypoinflated. The interstitial markings are increased bilaterally. The heart is normal in size. The pulmonary vascularity is not clearly engorged. There is no pleural effusion. The bony thorax exhibits no acute abnormality. IMPRESSION: Increased interstitial markings bilaterally may reflect interstitial edema of cardiac or noncardiac cause. No alveolar pneumonia. Electronically Signed   By: David  Martinique M.D.   On: 07/21/2018 12:06   Ct Head Wo Contrast  Result Date: 07/08/2018 CLINICAL DATA:  Slurred speech, lethargy and confusion today. EXAM: CT HEAD WITHOUT CONTRAST TECHNIQUE: Contiguous axial images were obtained from the base of the skull through the vertex without intravenous contrast.  COMPARISON:  None. FINDINGS: Brain: No evidence of acute infarction, hemorrhage, hydrocephalus, extra-axial collection or mass lesion/mass effect. Vascular: No hyperdense vessel or unexpected calcification. Skull: Normal. Negative for fracture or focal lesion. Sinuses/Orbits: Negative. Other: None. IMPRESSION: Negative head CT. Electronically Signed   By: Inge Rise M.D.   On: 07/08/2018 12:42   Ct Abdomen Pelvis W Contrast  Result Date: 07/07/2018 CLINICAL DATA:  Vomiting, fever, and altered mental status. EXAM: CT ABDOMEN AND PELVIS WITH CONTRAST TECHNIQUE: Multidetector CT imaging of the abdomen and pelvis was performed using the standard protocol following bolus administration of intravenous contrast. CONTRAST:  31mL OMNIPAQUE IOHEXOL 300 MG/ML  SOLN COMPARISON:  None. FINDINGS: Lower chest: Breathing motion artifact. Subsegmental atelectasis in the right lower lobe. No confluent consolidation. No pleural fluid. There are coronary artery calcifications. Hepatobiliary: No focal hepatic abnormality. Mild gallbladder distention, motion artifact through the gallbladder limits assessment for wall thickening. No calcified gallstone. No biliary dilatation. Pancreas: Completely fatty replaced. No ductal dilatation or inflammation. Spleen: Normal in size without focal abnormality. Adrenals/Urinary Tract: Normal adrenal glands. No hydronephrosis. Bilateral symmetric perinephric edema. Delayed excretion on delayed phase imaging. Urinary bladder is nondistended. Equivocal bladder wall thickening versus nondistention. Stomach/Bowel: Stomach physiologically distended. No small bowel wall thickening, inflammatory change or wall thickening, slight motion artifact limitations. Cecum is high-riding in the right mid abdomen. Normal appendix. Hepatic flexure through descending colon are nondistended, limiting assessment. Short segment proximal sigmoid colonic wall thickening with questionable pericolonic edema, image 71  series 2, partially obscured by motion. Moderate stool in the more distal sigmoid colon, stool distends the rectum which spans 7 cm. Vascular/Lymphatic: Moderate aortic and branch atherosclerosis. No enlarged lymph nodes in the abdomen or pelvis. Reproductive: Central prostatic calcifications. Other: Small amount of free fluid in the pelvis, likely reactive. No free air or intra-abdominal abscess. Scattered soft tissue densities in the anterior abdominal wall most consistent with medication injection sites. Musculoskeletal: There are no acute or suspicious osseous abnormalities. IMPRESSION: 1. Short segment sigmoid colonic wall thickening with questionable pericolonic edema, suspicious for colitis. Motion artifact limits detailed assessment. Consider follow-up colonoscopy to exclude underlying neoplasm after resolution of acute event. Hepatic flexure through the distal descending colon are nondistended, limiting assessment for additional subtle colonic wall thickening. 2. Moderate colonic stool in the sigmoid colon, stool distends the rectum. 3. Bilateral perinephric edema, often chronic and related to underlying renal dysfunction, recommend correlation with urinalysis to exclude urinary tract infection. 4. Complete fatty replacement of the pancreas, likely secondary to history of diabetes. 5. Aortic Atherosclerosis (ICD10-I70.0). Coronary artery calcifications. Electronically Signed   By: Keith Rake M.D.   On: 07/07/2018 23:32   US Renal  Result Date: 07/09/2018 CLINICAL DATA:  Acute renal failure. EXAM: RENAL / URINARY TRACT ULTRASOUND COMPLETE COMPARISON:  CT abdomen and pelvis 07/07/2018. FINDINGS: Right Kidney: Length: 11.2 cm. Echogenicity within normal limits. No mass or hydronephrosis visualized. Left Kidney: Length: 12.1 cm. Echogenicity within normal limits. No mass or hydronephrosis visualized. Bladder: Not visualized. IMPRESSION: Negative for hydronephrosis. The kidneys appear normal. The  urinary bladder is not visualized likely due to decompression. Electronically Signed   By: Inge Rise M.D.   On: 07/09/2018 14:09   Dg Chest Port 1 View  Result Date: 07/20/2018 CLINICAL DATA:  Fever. EXAM: PORTABLE CHEST 1 VIEW COMPARISON:  07/09/2018 FINDINGS: Shallow inspiration. Heart size and pulmonary vascularity are normal. Lungs are clear. No blunting of costophrenic angles. No pneumothorax. Mediastinal contours appear intact. IMPRESSION: No active disease. Electronically Signed   By: Lucienne Capers M.D.   On: 07/20/2018 21:56   Dg Chest Port 1 View  Result Date: 07/09/2018 CLINICAL DATA:  Chronic congestive heart failure. Coronary artery disease and stents. EXAM: PORTABLE CHEST 1 VIEW COMPARISON:  07/07/2018. FINDINGS: A poor inspiration is again demonstrated with a grossly normal sized heart. Interval minimal right basilar atelectasis. Otherwise, clear lungs. Coronary artery stent. Thoracic spine degenerative changes. IMPRESSION: Poor inspiration with minimal right basilar atelectasis. Electronically Signed   By: Claudie Revering M.D.   On: 07/09/2018 11:42    ECG & Cardiac Imaging    Sinus tachycardia, 148, prior inferior and anterolateral infarct.- personally reviewed.  Assessment & Plan    1.  Fever/chills/sepsis: Patient previously admitted with acute sigmoid colitis and sepsis.  He recently completed antibiotics and was feeling better but then had recurrent fever last night with a temperature of 103 at home.  He has had intermittent low-grade fever here.  Lactate was elevated at 3.1.  White count is normal.  Chest x-ray nonacute.  Antibiotic's per internal medicine.  2.  Elevated troponin/CAD: Status post recent non-STEMI in the setting of sepsis.  Troponin rose to 11 at that time and we recommended medical therapy with plan for outpatient follow-up and catheterization once he had fully recovered.  He has been having intermittent nitrate responsive chest pain, which is not  uncommon for him.  He is status post PCI and drug-eluting stent placement to the left circumflex in July, at  which time he had patent LAD, RCA, and OM 2 stents.  Troponin on this admission 0.072 which could actually be in ongoing downtrend from his last admission.  Continue medical therapy.  In the absence of chest pain and so long as his troponin remains flat, we would still prefer to have him fully recover prior to considering diagnostic catheterization.  Continue aspirin, statin, Plavix, beta-blocker, and Entresto as blood pressure allows.  3.  Chronic combined systolic and diastolic congestive heart failure/ischemic cardiomyopathy: Euvolemic on exam.  We will have to watch volume status very closely in the setting of IV fluids.  He is on beta-blocker and Entresto long-term we may need to hold his Entresto if pressures remain soft.  4.  Sinus tachycardia: He has a long history of sinus tachycardia with rates typically in the low 100s.  Rates were higher in the setting of acute illness but have since been more similar to his baseline.  Hold entresto and resume beta-blocker as pressure allows.  5.  Stage II-III chronic kidney disease: Stable.  6.  Hyperlipidemia: LDL was 50 in August.  Continue low-dose statin and Zetia therapy.  7.  Type 2 diabetes mellitus: A1c was 7.9 in August.  Insulin management per internal medicine.  8.  Chronic pain: Patient uses oxycodone 10 mg every 6 hours at home.  Recommend resumption in order to avoid withdrawal.  Signed, Murray Hodgkins, NP 07/21/2018, 3:19 PM  For questions or updates, please contact   Please consult [email or web address] for contact info under Cardiology/STEMI.

## 2018-07-21 NOTE — Care Management (Addendum)
Patient discharged from [facility] 10/9 after stay for colitis and fever. Presents back with fever and nausea after just completing his oral antibiotics.  Is followed by outpatient pulmonary rehab. No issues with accessing medical care, paying for medications or with transportation.  He is currently open to [employer] and PT. Notified agency of admission

## 2018-07-21 NOTE — Progress Notes (Signed)
Patient ID: [name], male   DOB: 01/03/1961, 57 y.o.   MRN: [id number]  ACP note  Patient, wife and son at the bedside  Diagnosis: Clinical sepsis but unclear source, fever, tachycardia.  History of CAD, CHF, diabetes, hypertension, GERD, hyperlipidemia, sleep apnea.  Borderline troponin secondary to infection.  Lactic acidosis.  Patient is a full code.  Plan.  Patient was recently in the hospital treated for sepsis and suspected to colitis given antibiotics in the hospital and then Levaquin and Flagyl to go home with.  Once he stopped the antibiotics and spiked a fever and had altered mental status and chills and shivering.  He had nausea vomiting over the weekend.  Repeat chest x-ray is negative, urinalysis is negative.  Unclear where the source of his fever is coming from.  We will get infectious disease consultation.  Follow-up cultures.  Continue antibiotics empirically  Stop IV fluids since chest x-ray starting to show signs of fluid overload.  Time spent on ACP discussion 30 minutes Dr. Loletha Grayer

## 2018-07-21 NOTE — Progress Notes (Signed)
Pharmacy Antibiotic Note  ISAM UNREIN is a 57 y.o. male admitted on 07/20/2018 with unknown source.  Pharmacy has been consulted for vancomycin and cefepime dosing.  Plan: DW 86kg  Vd 60L kei 0.092 hr-1  T1/2 8 hours Vancomycin 1 gram q 8 hours ordered with stacked dosing. Level before 5th dose. Goal trough 15-20  Cefepime 2 grams q 8 hours ordered  Height: 5\' 7"  (170.2 cm) Weight: 252 lb 9.6 oz (114.6 kg) IBW/kg (Calculated) : 66.1  Temp (24hrs), Avg:99.1 F (37.3 C), Min:98.9 F (37.2 C), Max:99.3 F (37.4 C)  Recent Labs  Lab 07/20/18 2053 07/20/18 2240  WBC 8.8  --   CREATININE 0.91  --   LATICACIDVEN 3.1* 2.6*    Estimated Creatinine Clearance: 105.7 mL/min (by C-G formula based on SCr of 0.91 mg/dL).    Allergies  Allergen Reactions  . Metoprolol Rash  . Novolog [Insulin Aspart] Hives    Antimicrobials this admission: Vancomycin, cefepime 10/16  >>    >>   Dose adjustments this admission:   Microbiology results: 10/16 BCx: pending 10/4 MRSA PCR: (-)      10/16 CXR: no active disease 10/16 UA: (-) Thank you for allowing pharmacy to be a part of this patient's care.  Iman Orourke S 07/21/2018 2:55 AM

## 2018-07-21 NOTE — Consult Note (Signed)
NAME: [name]  DOB: 1961/09/16  MRN: [id number]  Date/Time: 07/21/2018 1:15 PM  Dr. Leslye Peer Subjective:  REASON FOR CONSULT: Fever ? [name] is a 57 y.o.male with  with a history of diabetes mellitus, myasthenia gravis, coronary artery disease status post multiple stents, diabetic peripheral neuropathy is admitted with chills and fever on 07/20/2018. Patient was recently in the hospital between 07/07/2018 until 07/13/2018 when he was admitted with fever and chills and was found to be septic with a very high procalcitonin.  CT abdomen had revealed bilateral symmetric perinephric edema with delayed excretion on delayed phase imaging, equal vocal bladder wall thickening and also inflammatory changes and wall thickening of the sigmoid colon with questionable pericolonic edema suspicious for colitis.  The procalcitonin was as high as 105 he was treated with IV vancomycin and cefepime and then was discharged home on Flagyl and Levaquin . He completed the Levaquin on Tuesday, 15 October.  He then started having chills on Wednesday and a temperature was more than 101 and so EMS was called and he was brought to the emergency department on 07/20/2018.  As per patient he had cardiac cath and stent placed in July and was doing cardiac rehab since then and was doing fine on 2 the last week of September when he had to skip 1 rehab because of feeling very tired.  On 07/07/2018 he had a routine pain management appointment for his low back pain.  He was feeling very tired when he was going to that appointment and even told his son to take him back home after that instead of going out to eat.  He did not receive any steroid injection that day.  He came to the ED the same evening as his family found him to be a little altered and he was also having chills.  His oxygen saturation was 85% on room air and when EMS arrived he had a temperature of 102.3.  He was admitted to the hospital between 07/07/2018 until  07/13/2018.  Patient also states during that hospitalization his urine was like kind of pink-colored which was never the case before.  He did not have any diarrhea or abdominal pain during that hospitalization.  He did not have any cough or shortness of breath.  He states he is gained at least 10 pounds of weight after that hospitalization probably because of IV fluids he received for sepsis. He has myasthenia gravis and is on Mestinon and recently his neurologist Dr. Melrose Nakayama on 06/28/2018 started him on azathioprine.  Patient  took it for 2 to 3 days and then stopped it because he did not want to be on an  Immunosuppressant. Patient has not had any travel.  He does not engage in any risky outdoor activities including like white water rafting, fishing or hunting.  He does not do any yard work.  He has not had any recent tick bites or insect bites.  Received flu vaccine in August.  He has a dog and he  walks him on a path.  Patient has been treated many years ago when he was in [place] for Lyme disease by a Lyme specialist.  He said he received 1 year of IV ceftriaxone through a port.  Was later diagnosed with myasthenia gravis.  He then moved to [facility] and because of the altitude he eventually moved to [place].  His wife has Charcot-Marie-Tooth disease.  He lives with her and her 46 year old son. Past Medical History:  Diagnosis Date  . Allergy    dust, seasonal (worse in the fall).  . 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 Followed by Pain Specialist in [place], back and neck  . Asthma    BRONCHITIS  . Cataract    First Dx in 2012  . Chronic combined systolic and diastolic congestive heart failure ([place])    a. 03/2018 Echo: EF 30-35%, ant, antlat, apical AK, Gr1 DD.  [name] Coronary artery disease    a. Prior Ant MI->s/p multiple stents placed in the LAD and right coronary artery ([place]); b. 2016 Cath: reportedly nonobs dzs;  c. 02/2017 MV: EF 45-54%, ap/ant, ap/inf, apical infarct, no ishcemia; c. 04/2018  Cath/PCI: LM nl, LAD 20p, patent mid stent, LCX 6m(3.25x15 Sierra DES), OM1 nl, OM2 50, OM3 40 w/ patent stent, RCA 40p, 69m, 40d w/ patent stent in RPDA, RPAV 60, EF 25-35%. 2+MR.  . Diabetes mellitus without complication ([place])    TYPE 2  . Diabetic peripheral neuropathy ([facility])    feet and hands  . GERD (gastroesophageal reflux disease)   . Headache    muscle tension  . Hyperlipidemia   . Hypertension    CONTROLLED ON MEDS  . Insomnia   . Ischemic cardiomyopathy    a. 03/2018 Echo: EF 30-35%, ant, antlat, apical AK, Gr1 DD, mild MR, mildly dil LA.  [name] Lyme disease    Chronic  . Myasthenia gravis ([place])   . Myocardial infarction ([place]) 2010  . Seasonal allergies   . Sleep apnea    CPAP    Past Surgical History:  Procedure Laterality Date  . BILATERAL CARPAL TUNNEL RELEASE Bilateral L in 2012 and R in 2013  . CARDIAC CATHETERIZATION     Several Caths, most recent in  March 2016.  [name] COLONOSCOPY WITH PROPOFOL N/A 01/10/2016   Procedure: COLONOSCOPY WITH PROPOFOL;  Surgeon: [name], MD;  Location: [facility] ENDOSCOPY;  Service: Endoscopy;  Laterality: N/A;  . CORONARY ANGIOPLASTY    . CORONARY STENT INTERVENTION N/A 04/25/2018   Procedure: CORONARY STENT INTERVENTION;  Surgeon: [name], MD;  Location: [facility];  Service: Cardiovascular;  Laterality: N/A;  . ESOPHAGOGASTRODUODENOSCOPY (EGD) WITH PROPOFOL N/A 01/10/2016   Procedure: ESOPHAGOGASTRODUODENOSCOPY (EGD) WITH PROPOFOL;  Surgeon: [name], MD;  Location: [facility] ENDOSCOPY;  Service: Endoscopy;  Laterality: N/A;  . EYE SURGERY Bilateral 2012   cataract/bilateral vitrectomies  . LEFT HEART CATH AND CORONARY ANGIOGRAPHY Left 04/25/2018   Procedure: LEFT HEART CATH AND CORONARY ANGIOGRAPHY;  Surgeon: [name], MD;  Location: [facility];  Service: Cardiovascular;  Laterality: Left;  . TONSILLECTOMY AND ADENOIDECTOMY     As a child  . TUNNELED VENOUS CATHETER PLACEMENT     removed    Social  history Non-smoker No alcohol No illicit drug use Retired Engineer, building services  Family History  Problem Relation Age of Onset  . Diabetes Mother   . Heart disease Mother   . Cancer Father        Prostate CA  . Diabetes Brother   . Healthy Brother   . Healthy Brother    Allergies  Allergen Reactions  . Metoprolol Rash  . Novolog [Insulin Aspart] Hives  ? Current Facility-Administered Medications  Medication Dose Route Frequency Provider Last Rate Last Dose  . 0.9 %  sodium chloride infusion   Intravenous Continuous Wieting, Richard, MD      . acetaminophen (TYLENOL) tablet 650 mg  650 mg Oral Q6H PRN Lance Coon, MD  650 mg at 07/21/18 0442   Or  . acetaminophen (TYLENOL) suppository 650 mg  650 mg Rectal Q6H PRN Lance Coon, MD      . alum & mag hydroxide-simeth (MAALOX/MYLANTA) 200-200-20 MG/5ML suspension 30 mL  30 mL Oral Q4H PRN Lance Coon, MD   30 mL at 07/21/18 0442  . aspirin EC tablet 81 mg  81 mg Oral Daily Lance Coon, MD   81 mg at 07/21/18 1038  . ceFEPIme (MAXIPIME) 2 g in sodium chloride 0.9 % 100 mL IVPB  2 g Intravenous Q8H Arta Silence, MD 200 mL/hr at 07/21/18 1035 2 g at 07/21/18 1035  . clopidogrel (PLAVIX) tablet 75 mg  75 mg Oral Q breakfast Lance Coon, MD   75 mg at 07/21/18 1034  . enoxaparin (LOVENOX) injection 40 mg  40 mg Subcutaneous Q24H Lance Coon, MD      . fluticasone furoate-vilanterol (BREO ELLIPTA) 200-25 MCG/INH 1 puff  1 puff Inhalation Daily Lance Coon, MD   1 puff at 07/21/18 1038  . insulin glargine (LANTUS) injection 15 Units  15 Units Subcutaneous Daily Loletha Grayer, MD   15 Units at 07/21/18 1029  . insulin regular (NOVOLIN R,HUMULIN R) 100 units/mL injection 0-9 Units  0-9 Units Subcutaneous TID WC Loletha Grayer, MD   7 Units at 07/21/18 1236  . insulin regular (NOVOLIN R,HUMULIN R) 100 units/mL injection 3 Units  3 Units Subcutaneous TID WC Loletha Grayer, MD   3 Units at 07/21/18 1236  . ondansetron (ZOFRAN)  tablet 4 mg  4 mg Oral Q6H PRN Lance Coon, MD       Or  . ondansetron [facility]) injection 4 mg  4 mg Intravenous Q6H PRN Lance Coon, MD      . oxyCODONE-acetaminophen (PERCOCET/ROXICET) 5-325 MG per tablet 1 tablet  1 tablet Oral Q6H PRN Harrie Foreman, MD   1 tablet at 07/21/18 1024  . pantoprazole (PROTONIX) EC tablet 40 mg  40 mg Oral Daily Lance Coon, MD   40 mg at 07/21/18 1034  . rosuvastatin (CRESTOR) tablet 5 mg  5 mg Oral Daily Lance Coon, MD   5 mg at 07/21/18 1037  . sacubitril-valsartan (ENTRESTO) 24-26 mg per tablet  1 tablet Oral BID Lance Coon, MD   1 tablet at 07/21/18 1035  . vancomycin (VANCOCIN) IVPB 1000 mg/200 mL premix  1,000 mg Intravenous Boyce Medici, MD 200 mL/hr at 07/21/18 0610 1,000 mg at 07/21/18 0610     Abtx:  Anti-infectives (From admission, onward)   Start     Dose/Rate Route Frequency Ordered Stop   07/21/18 0800  ceFEPIme (MAXIPIME) 2 g in sodium chloride 0.9 % 100 mL IVPB     2 g 200 mL/hr over 30 Minutes Intravenous Every 8 hours 07/21/18 0413     07/21/18 0600  vancomycin (VANCOCIN) IVPB 1000 mg/200 mL premix     1,000 mg 200 mL/hr over 60 Minutes Intravenous Every 8 hours 07/21/18 0414     07/21/18 0300  ceFEPIme (MAXIPIME) 2 g in sodium chloride 0.9 % 100 mL IVPB  Status:  Discontinued     2 g 200 mL/hr over 30 Minutes Intravenous Every 8 hours 07/21/18 0254 07/21/18 0413   07/21/18 0300  vancomycin (VANCOCIN) IVPB 1000 mg/200 mL premix  Status:  Discontinued     1,000 mg 200 mL/hr over 60 Minutes Intravenous Every 8 hours 07/21/18 0254 07/21/18 0414   07/20/18 2245  ceFEPIme (MAXIPIME) 2 g in sodium chloride 0.9 %  100 mL IVPB     2 g 200 mL/hr over 30 Minutes Intravenous  Once 07/20/18 2244 07/20/18 2326   07/20/18 2245  vancomycin (VANCOCIN) IVPB 1000 mg/200 mL premix     1,000 mg 200 mL/hr over 60 Minutes Intravenous  Once 07/20/18 2244 07/21/18 0040      REVIEW OF SYSTEMS:  Const:  fever,  chills, weight  gain Eyes: Drooping of the  left eyelid, some diplopia ENT: negative coryza, negative sore throat Resp: negative cough, hemoptysis, dyspnea Cards: negative for chest pain, palpitations, as lower extremity edema GU: Days his urine output is still less recently as he has cut down on water intake because of congestive heart failure GI: Right sided abdominal pain which he thought was due to nausea and vomiting, no diarrhea, bleeding, constipation Skin: Had a skin rash a year ago and was diagnosed with psoriasis Heme: negative for easy bruising and gum/nose bleeding MS: Has back pain, muscle weakness Neurolo:negative for headaches, dizziness, vertigo, memory problems  Psych: negative for feelings of anxiety, depression  Endocrine: No polyuria or polydipsia  allergy/Immunology-metoprolol gave him a rash and NovoLog insulin gave him hives ? Pertinent Positives include : Objective:  VITALS:  BP 100/74 (BP Location: Left Arm)   Pulse (!) 107   Temp 98.3 F (36.8 C) (Oral)   Resp 17   Ht 5\' 7"  (1.702 m)   Wt 107.6 kg   SpO2 97%   BMI 37.15 kg/m  PHYSICAL EXAM:  General: Alert, cooperative, no distress, appears stated age.  Head: Normocephalic, without obvious abnormality, atraumatic. Eyes: left ptosis,  ENT Nares normal. No drainage or sinus tenderness. Lips, mucosa, and tongue normal. No Thrush Neck: Supple, symmetrical, no adenopathy, thyroid: non tender no carotid bruit and no JVD. Back: No CVA tenderness. Lungs: Clear to auscultation bilaterally.  Heart: Regular rate and rhythm, no murmur, rub or gallop. Abdomen: Soft, non-tender, distended. Bowel sounds normal. No masses Extremities: atraumatic, no cyanosis. +edema. No clubbing Skin: Faint macular rash on his left upper extremity  lymph: Cervical, supraclavicular normal. Neurologic: Not examined detail pertinent Labs CBC Latest Ref Rng & Units 07/21/2018 07/20/2018 07/12/2018  WBC 4.0 - 10.5 K/uL 9.4 8.8 7.0  Hemoglobin 13.0 -  17.0 g/dL 9.8(L) 11.2(L) 10.4(L)  Hematocrit 39.0 - 52.0 % 31.1(L) 34.6(L) 30.4(L)  Platelets 150 - 400 K/uL 270 303 129(L)    CMP Latest Ref Rng & Units 07/21/2018 07/20/2018 07/12/2018  Glucose 70 - 99 mg/dL 324(H) 295(H) 232(H)  BUN 6 - 20 mg/dL 14 13 23(H)  Creatinine 0.61 - 1.24 mg/dL 1.15 0.91 1.02  Sodium 135 - 145 mmol/L 136 134(L) 140  Potassium 3.5 - 5.1 mmol/L 4.8 4.2 4.2  Chloride 98 - 111 mmol/L 105 100 113(H)  CO2 22 - 32 mmol/L 25 23 23   Calcium 8.9 - 10.3 mg/dL 8.0(L) 8.6(L) 7.5(L)  Total Protein 6.5 - 8.1 g/dL - 6.0(L) -  Total Bilirubin 0.3 - 1.2 mg/dL - 0.7 -  Alkaline Phos 38 - 126 U/L - 70 -  AST 15 - 41 U/L - 37 -  ALT 0 - 44 U/L - 25 -    Blood culture done on 07/07/2018 was negative MRSA by PCR of the nares was -07/20/2018 blood cultures so far is negative   IMAGING RESULTS: CT abdomen from 07/08/18 Short segment sigmoid colonic wall thickening with questionable pericolonic edema, suspicious for colitis. Motion artifact limits detailed assessment. Consider follow-up colonoscopy to exclude underlying neoplasm after resolution of acute event. Hepatic  flexure through the distal descending colon are nondistended, limiting assessment for additional subtle colonic wall thickening. 2. Moderate colonic stool in the sigmoid colon, stool distends the rectum. 3. Bilateral perinephric edema, often chronic and related to underlying renal dysfunction, recommend correlation with urinalysis to exclude urinary tract infection.  ? Impression/Recommendation ?57 y.o.male with  with a history of diabetes mellitus, myasthenia gravis, coronary artery disease status post multiple stents, diabetic peripheral neuropathy is admitted with chills and fever on 07/20/2018. Patient was recently in the hospital between 07/07/2018 until 07/13/2018 when he was admitted with fever and chills and was found to be septic with a very high procalcitonin.  CT abdomen had revealed bilateral  symmetric perinephric edema with delayed excretion on delayed phase imaging, equal vocal bladder wall thickening and also inflammatory changes and wall thickening of the sigmoid colon with questionable pericolonic edema suspicious for colitis.  The procalcitonin was as high as 105 he was treated with IV vancomycin and cefepime and then was discharged home on Flagyl and Levaquin .  Recurrent fever and chills versus ongoing process.  No obvious source Recent CT scan from 07/08/2018 and showed colitis-like picture as well as perinephric edema bilaterally.  He also had a very high procalcitonin and that time which is indicative of an infection.  During this hospitalization he does not have any pneumonia or symptoms suggestive of a viral infection or urinary tract infection as suggestive of a clear urine.  But he does complain of decreased urine output which could be secondary to him not drinking water as he used to do before. He had a cardiac cath in July and stent placement He has myasthenia gravis and is only on pyridostigmine and not on any immunosuppressants currently.  He only took azathioprine for 2 days. So he could have an intra-abdominal source versus urinary source.  Unlikely endocarditis. ?Would recommend getting another CT abdomen and pelvis with contrast  We will get , LDH, pro-Cal and respiratory viral panel He is currently on cefepime .may  change to Zosyn.  Myasthenia gravis:On pyridostigmine  Coronary artery disease status post multiple stents.  On Plavix and aspirin  CHF has an EF of 35 to 40%  Diabetes mellitus on triple therapy with metformin Victoza and insulin ? ___________________________________________________ Discussed with patient, requesting provider

## 2018-07-21 NOTE — ED Notes (Signed)
Patient is resting comfortably. 

## 2018-07-22 ENCOUNTER — Inpatient Hospital Stay: Payer: Medicare Other

## 2018-07-22 ENCOUNTER — Encounter: Payer: Self-pay | Admitting: Radiology

## 2018-07-22 DIAGNOSIS — R21 Rash and other nonspecific skin eruption: Secondary | ICD-10-CM

## 2018-07-22 DIAGNOSIS — I255 Ischemic cardiomyopathy: Secondary | ICD-10-CM

## 2018-07-22 DIAGNOSIS — R7989 Other specified abnormal findings of blood chemistry: Secondary | ICD-10-CM

## 2018-07-22 DIAGNOSIS — R509 Fever, unspecified: Secondary | ICD-10-CM

## 2018-07-22 DIAGNOSIS — I509 Heart failure, unspecified: Secondary | ICD-10-CM

## 2018-07-22 DIAGNOSIS — R6520 Severe sepsis without septic shock: Secondary | ICD-10-CM

## 2018-07-22 LAB — RESPIRATORY PANEL BY PCR
Adenovirus: NOT DETECTED
Bordetella pertussis: NOT DETECTED
CORONAVIRUS HKU1-RVPPCR: NOT DETECTED
CORONAVIRUS NL63-RVPPCR: NOT DETECTED
CORONAVIRUS OC43-RVPPCR: NOT DETECTED
Chlamydophila pneumoniae: NOT DETECTED
Coronavirus 229E: NOT DETECTED
INFLUENZA A-RVPPCR: NOT DETECTED
Influenza B: NOT DETECTED
METAPNEUMOVIRUS-RVPPCR: NOT DETECTED
Mycoplasma pneumoniae: NOT DETECTED
PARAINFLUENZA VIRUS 1-RVPPCR: NOT DETECTED
PARAINFLUENZA VIRUS 2-RVPPCR: NOT DETECTED
PARAINFLUENZA VIRUS 3-RVPPCR: NOT DETECTED
Parainfluenza Virus 4: NOT DETECTED
Respiratory Syncytial Virus: NOT DETECTED
Rhinovirus / Enterovirus: NOT DETECTED

## 2018-07-22 LAB — PROCALCITONIN: PROCALCITONIN: 2.61 ng/mL

## 2018-07-22 LAB — LACTATE DEHYDROGENASE: LDH: 130 U/L (ref 98–192)

## 2018-07-22 LAB — GLUCOSE, CAPILLARY
GLUCOSE-CAPILLARY: 216 mg/dL — AB (ref 70–99)
GLUCOSE-CAPILLARY: 274 mg/dL — AB (ref 70–99)
GLUCOSE-CAPILLARY: 242 mg/dL — AB (ref 70–99)
Glucose-Capillary: 280 mg/dL — ABNORMAL HIGH (ref 70–99)

## 2018-07-22 MED ORDER — INSULIN GLARGINE 100 UNIT/ML ~~LOC~~ SOLN
20.0000 [IU] | Freq: Every day | SUBCUTANEOUS | Status: DC
  Administered 2018-07-23 – 2018-07-24 (×2): 20 [IU] via SUBCUTANEOUS
  Filled 2018-07-22 (×2): qty 0.2

## 2018-07-22 MED ORDER — INSULIN REGULAR HUMAN 100 UNIT/ML IJ SOLN
5.0000 [IU] | Freq: Three times a day (TID) | INTRAMUSCULAR | Status: DC
  Administered 2018-07-22 – 2018-07-24 (×5): 5 [IU] via SUBCUTANEOUS
  Filled 2018-07-22: qty 10

## 2018-07-22 MED ORDER — IOPAMIDOL (ISOVUE-300) INJECTION 61%
100.0000 mL | Freq: Once | INTRAVENOUS | Status: AC | PRN
  Administered 2018-07-22: 17:00:00 100 mL via INTRAVENOUS

## 2018-07-22 MED ORDER — IOPAMIDOL (ISOVUE-300) INJECTION 61%
15.0000 mL | INTRAVENOUS | Status: AC
  Administered 2018-07-22 (×2): 15 mL via ORAL

## 2018-07-22 MED ORDER — PIPERACILLIN-TAZOBACTAM 3.375 G IVPB
3.3750 g | Freq: Three times a day (TID) | INTRAVENOUS | Status: DC
  Administered 2018-07-22 – 2018-07-24 (×6): 3.375 g via INTRAVENOUS
  Filled 2018-07-22 (×6): qty 50

## 2018-07-22 NOTE — Progress Notes (Signed)
Pharmacy Antibiotic Note  [name] is a 57 y.o. male admitted on 07/20/2018 with unknown source.  Pharmacy has been consulted for vancomycin and cefepime dosing.  Plan: Vancomycin was stopped. Continue Cefepime 2 grams q 8 hours.   Height: 5\' 7"  (170.2 cm) Weight: 236 lb 15.9 oz (107.5 kg) IBW/kg (Calculated) : 66.1  Temp (24hrs), Avg:98.1 F (36.7 C), Min:97.5 F (36.4 C), Max:98.8 F (37.1 C)  Recent Labs  Lab 07/20/18 2053 07/20/18 2240 07/21/18 0445  WBC 8.8  --  9.4  CREATININE 0.91  --  1.15  LATICACIDVEN 3.1* 2.6* 1.8    Estimated Creatinine Clearance: 80.9 mL/min (by C-G formula based on SCr of 1.15 mg/dL).    Allergies  Allergen Reactions  . Metoprolol Rash  . Novolog [Insulin Aspart] Hives    Antimicrobials this admission: Vancomycin 10/16 >> 10/18 Cefepime 10/16  >>   Microbiology results: 10/16 BCx: NGTD 10/4 MRSA PCR: (-)  10/16 CXR: no active disease 10/16 UA: (-)  Thank you for allowing pharmacy to be a part of this patient's care.  Paulina Fusi, PharmD, BCPS 07/22/2018 1:10 PM

## 2018-07-22 NOTE — Progress Notes (Signed)
Inpatient Diabetes Program Recommendations  AACE/ADA: New Consensus Statement on Inpatient Glycemic Control (2019)  Target Ranges:  Prepandial:   less than 140 mg/dL      Peak postprandial:   less than 180 mg/dL (1-2 hours)      Critically ill patients:  140 - 180 mg/dL   Results for [name], Edgar "[name]" (MRN [id number]) as of 07/22/2018 10:34  Ref. Range 07/21/2018 07:47 07/21/2018 12:14 07/21/2018 16:38 07/21/2018 21:12 07/22/2018 08:04  Glucose-Capillary Latest Ref Range: 70 - 99 mg/dL 280 (H) 307 (H) 296 (H) 262 (H) 216 (H)   Review of Glycemic Control  Diabetes history: DM2 Outpatient Diabetes medications: Lantus 25-30 units QHS, Humalog 7-8 units TID (1 unit for every 8 grams of carbs and 1 unit for every 20 mg/dl >120 mg/dl), Victoza 1.8 mg daily, Metformin 1000 mg BID Current orders for Inpatient glycemic control: Lantus 15 units daily, Regular 3 units TID with meals for meal coverage, Regular 0-9 units TID with meals  Inpatient Diabetes Program Recommendations:  Insulin - Basal: Please consider increasing Lantus to 20 units daily. If Lantus increased as recommend please also order one time dose of Lantus 5 units x 1 today (since patient has already received Lantus 15 units today). Insulin - Meal Coverage: Please consider increasing meal coverage to Regular 5 units TID with meals.  Thanks, Barnie Alderman, RN, MSN, CDE Diabetes Coordinator Inpatient Diabetes Program [phone] (Team Pager from 8am to 5pm)

## 2018-07-22 NOTE — Progress Notes (Addendum)
Progress Note  Patient Name: [name] Date of Encounter: 07/22/2018  Primary Cardiologist: [name], MD   Subjective   No complaints today, reports that he has not appreciated any fevers once antibiotics restarted No tachycardia, shortness of breath, chest pain Denies any significant GI issues Scheduled for CT scan abdomen pelvis later this evening, current drinking contrast    Inpatient Medications    Scheduled Meds: . aspirin EC  81 mg Oral Daily  . carvedilol  3.125 mg Oral BID WC  . clopidogrel  75 mg Oral Q breakfast  . enoxaparin (LOVENOX) injection  40 mg Subcutaneous Q24H  . fluticasone furoate-vilanterol  1 puff Inhalation Daily  . [START ON 07/23/2018] insulin glargine  20 Units Subcutaneous Daily  . insulin regular  0-9 Units Subcutaneous TID WC  . insulin regular  5 Units Subcutaneous TID WC  . pantoprazole  40 mg Oral Daily  . rosuvastatin  5 mg Oral Daily   Continuous Infusions: . piperacillin-tazobactam (ZOSYN)  IV 3.375 g (07/22/18 1750)   PRN Meds: acetaminophen **OR** acetaminophen, alum & mag hydroxide-simeth, ondansetron **OR** ondansetron (ZOFRAN) IV, oxyCODONE-acetaminophen   Vital Signs    Vitals:   07/22/18 0511 07/22/18 0824 07/22/18 1441 07/22/18 1742  BP: 126/76 124/75 120/72 133/78  Pulse: (!) 106 (!) 104 (!) 106 (!) 103  Resp: 18 18 18    Temp: 98.8 F (37.1 C) 98.2 F (36.8 C) 98.1 F (36.7 C)   TempSrc: Oral Oral Oral   SpO2: 97% 97% 96%   Weight:      Height:        Intake/Output Summary (Last 24 hours) at 07/22/2018 1846 Last data filed at 07/22/2018 1400 Gross per 24 hour  Intake 700 ml  Output -  Net 700 ml   Filed Weights   07/20/18 2033 07/21/18 0404 07/22/18 0450  Weight: 114.6 kg 107.6 kg 107.5 kg    Telemetry    Normal sinus rhythm- personally Reviewed  ECG    - Personally Reviewed  Physical Exam   GEN: No acute distress.   Neck: No JVD Cardiac: RRR, no murmurs, rubs, or gallops.    Respiratory: Clear to auscultation bilaterally. GI: Soft, nontender, non-distended  MS: No edema; No deformity. Neuro:  Nonfocal  Psych: Normal affect   Labs    Chemistry Recent Labs  Lab 07/20/18 2053 07/21/18 0445  NA 134* 136  K 4.2 4.8  CL 100 105  CO2 23 25  GLUCOSE 295* 324*  BUN 13 14  CREATININE 0.91 1.15  CALCIUM 8.6* 8.0*  PROT 6.0*  --   ALBUMIN 3.2*  --   AST 37  --   ALT 25  --   ALKPHOS 70  --   BILITOT 0.7  --   GFRNONAA >60 >60  GFRAA >60 >60  ANIONGAP 11 6     Hematology Recent Labs  Lab 07/20/18 2053 07/21/18 0445  WBC 8.8 9.4  RBC 3.88* 3.45*  HGB 11.2* 9.8*  HCT 34.6* 31.1*  MCV 89.2 90.1  MCH 28.9 28.4  MCHC 32.4 31.5  RDW 15.1 15.1  PLT 303 270    Cardiac Enzymes Recent Labs  Lab 07/20/18 2053 07/21/18 0314 07/21/18 0923 07/21/18 1507  TROPONINI 0.07* 0.07* 0.06* 0.05*   No results for input(s): TROPIPOC in the last 168 hours.   BNPNo results for input(s): BNP, PROBNP in the last 168 hours.   DDimer No results for input(s): DDIMER in the last 168 hours.   Radiology  Dg Chest 2 View  Result Date: 07/21/2018 CLINICAL DATA:  Fever nausea. Recent hospitalization for sepsis and colitis and went home on oral antibiotics. Symptoms recurred 2 days ago. EXAM: CHEST - 2 VIEW COMPARISON:  Chest x-ray of July 07, 2018 and July 20, 2018 FINDINGS: The lungs are mildly hypoinflated. The interstitial markings are increased bilaterally. The heart is normal in size. The pulmonary vascularity is not clearly engorged. There is no pleural effusion. The bony thorax exhibits no acute abnormality. IMPRESSION: Increased interstitial markings bilaterally may reflect interstitial edema of cardiac or noncardiac cause. No alveolar pneumonia. Electronically Signed   By: David  Martinique M.D.   On: 07/21/2018 12:06   Ct Chest W Contrast  Result Date: 07/22/2018 CLINICAL DATA:  Fever of unknown origin. EXAM: CT CHEST, ABDOMEN AND PELVIS WITHOUT  CONTRAST TECHNIQUE: Multidetector CT imaging of the chest, abdomen and pelvis was performed following the standard protocol without IV contrast. COMPARISON:  None. FINDINGS: CT CHEST FINDINGS Cardiovascular: Conventional branch pattern of the great vessels with minimal atherosclerosis at the origins. Nonaneurysmal slightly atherosclerotic thoracic aorta without dissection. No acute pulmonary embolus to the proximal lobar levels. Left main and three-vessel coronary arteriosclerosis. No pericardial effusion. Normal size heart. Mediastinum/Nodes: No enlarged mediastinal, hilar, or axillary lymph nodes. Thyroid gland, trachea, and esophagus demonstrate no significant findings. Lungs/Pleura: Subsegmental atelectasis at each lung base. No effusion or pneumothorax. No dominant mass. Musculoskeletal: No chest wall mass or suspicious bone lesions identified. CT ABDOMEN PELVIS FINDINGS Hepatobiliary: No focal liver abnormality is seen. No gallstones, gallbladder wall thickening, or biliary dilatation. Pancreas: Fatty replacement of the pancreas. No mass or ductal dilatation. Spleen: Normal in size without focal abnormality. Adrenals/Urinary Tract: Adrenal glands are unremarkable. Kidneys are normal, without renal calculi, focal lesion, or hydronephrosis. Bladder is unremarkable. Stomach/Bowel: Stomach is within normal limits. Appendix appears normal. No evidence of bowel wall thickening, distention, or inflammatory changes. Moderate stool retention within the right colon. No large bowel obstruction or inflammation. Minimal scattered left-sided colonic diverticulosis without acute diverticulitis. Vascular/Lymphatic: Mild aortoiliac and branch vessel atherosclerosis. No lymphadenopathy. Reproductive: Normal size prostate with coarse central zone calcifications. Normal seminal vesicles. Other: Mild subcutaneous soft tissue anasarca. Musculoskeletal: Degenerative changes are noted along the dorsal spine. No acute nor suspicious  osseous lesions. IMPRESSION: CT chest: 1. Left main and three-vessel coronary arteriosclerosis. 2. No active pulmonary disease. CT AP: 1. No acute solid nor hollow visceral organ abnormality to account for the patient's sepsis. 2. Mild subcutaneous soft tissue anasarca. 3. Scattered colonic diverticulosis without acute diverticulitis. 4. Fatty replaced pancreas. Electronically Signed   By: Ashley Royalty M.D.   On: 07/22/2018 18:00   Ct Abdomen Pelvis W Contrast  Result Date: 07/22/2018 CLINICAL DATA:  Fever of unknown origin. EXAM: CT CHEST, ABDOMEN AND PELVIS WITHOUT CONTRAST TECHNIQUE: Multidetector CT imaging of the chest, abdomen and pelvis was performed following the standard protocol without IV contrast. COMPARISON:  None. FINDINGS: CT CHEST FINDINGS Cardiovascular: Conventional branch pattern of the great vessels with minimal atherosclerosis at the origins. Nonaneurysmal slightly atherosclerotic thoracic aorta without dissection. No acute pulmonary embolus to the proximal lobar levels. Left main and three-vessel coronary arteriosclerosis. No pericardial effusion. Normal size heart. Mediastinum/Nodes: No enlarged mediastinal, hilar, or axillary lymph nodes. Thyroid gland, trachea, and esophagus demonstrate no significant findings. Lungs/Pleura: Subsegmental atelectasis at each lung base. No effusion or pneumothorax. No dominant mass. Musculoskeletal: No chest wall mass or suspicious bone lesions identified. CT ABDOMEN PELVIS FINDINGS Hepatobiliary: No focal liver abnormality is seen.  No gallstones, gallbladder wall thickening, or biliary dilatation. Pancreas: Fatty replacement of the pancreas. No mass or ductal dilatation. Spleen: Normal in size without focal abnormality. Adrenals/Urinary Tract: Adrenal glands are unremarkable. Kidneys are normal, without renal calculi, focal lesion, or hydronephrosis. Bladder is unremarkable. Stomach/Bowel: Stomach is within normal limits. Appendix appears normal. No  evidence of bowel wall thickening, distention, or inflammatory changes. Moderate stool retention within the right colon. No large bowel obstruction or inflammation. Minimal scattered left-sided colonic diverticulosis without acute diverticulitis. Vascular/Lymphatic: Mild aortoiliac and branch vessel atherosclerosis. No lymphadenopathy. Reproductive: Normal size prostate with coarse central zone calcifications. Normal seminal vesicles. Other: Mild subcutaneous soft tissue anasarca. Musculoskeletal: Degenerative changes are noted along the dorsal spine. No acute nor suspicious osseous lesions. IMPRESSION: CT chest: 1. Left main and three-vessel coronary arteriosclerosis. 2. No active pulmonary disease. CT AP: 1. No acute solid nor hollow visceral organ abnormality to account for the patient's sepsis. 2. Mild subcutaneous soft tissue anasarca. 3. Scattered colonic diverticulosis without acute diverticulitis. 4. Fatty replaced pancreas. Electronically Signed   By: Ashley Royalty M.D.   On: 07/22/2018 18:00   US Venous Img Lower Bilateral  Result Date: 07/22/2018 CLINICAL DATA:  Swelling, fever EXAM: BILATERAL LOWER EXTREMITY VENOUS DOPPLER ULTRASOUND TECHNIQUE: Gray-scale sonography with compression, as well as color and duplex ultrasound, were performed to evaluate the deep venous system from the level of the common femoral vein through the popliteal and proximal calf veins. COMPARISON:  None FINDINGS: Normal compressibility of the common femoral, superficial femoral, and popliteal veins, as well as the proximal calf veins. No filling defects to suggest DVT on grayscale or color Doppler imaging. Doppler waveforms show normal direction of venous flow, normal respiratory phasicity and response to augmentation. IMPRESSION: No evidence of  lower extremity deep vein thrombosis. Electronically Signed   By: Lucrezia Europe M.D.   On: 07/22/2018 09:41   Dg Chest Port 1 View  Result Date: 07/20/2018 CLINICAL DATA:  Fever.  EXAM: PORTABLE CHEST 1 VIEW COMPARISON:  07/09/2018 FINDINGS: Shallow inspiration. Heart size and pulmonary vascularity are normal. Lungs are clear. No blunting of costophrenic angles. No pneumothorax. Mediastinal contours appear intact. IMPRESSION: No active disease. Electronically Signed   By: Lucienne Capers M.D.   On: 07/20/2018 21:56    Cardiac Studies   Echocardiogram July 08, 2018 - Left ventricle: The cavity size was normal. Systolic function was   moderately reduced. The estimated ejection fraction was in the   range of 35% to 40%. Hypokinesis of the anteroseptal myocardium.   Hypokinesis of the apical myocardium. Hypokinesis of the anterior   myocardium. Doppler parameters are consistent with abnormal left   ventricular relaxation (grade 1 diastolic dysfunction). - Left atrium: The atrium was normal in size. - Right ventricle: Systolic function was normal. - Pulmonary arteries: Systolic pressure could not be accurately   estimated.   Patient Profile     Mr. Quach is a 57 year old man with history of coronary artery disease status post multiple PCI's, recent hospitalization with sepsis complicated by elevated troponin felt to represent demand ischemia, HTN, HLD, type 2 diabetes mellitus, diabetic neuropathy, myasthenia gravis, and obstructive sleep apnea, whom we have been asked to evaluate due to tachycardia and troponin elevation in the setting of recurrent sepsis.    Assessment & Plan   1.  Fever/chills/sepsis: Previous hospitalization with acute sigmoid colitis and sepsis.  After completing antibiotics, developed temperature of 103 at home.   Being followed by ID Plan for CT scan abdomen  pelvis  2.  Elevated troponin/CAD:  Minimal elevation 1 month ago, status post recent non-STEMI in the setting of sepsis.   Troponin rose to 11 at that time  intermittent nitrate responsive chest pain, which is not uncommon for him.   --PCI and drug-eluting stent placement to the  left circumflex in July,  patent LAD, RCA, and OM 2 stents.   ---- Continue medical therapy.   In the absence of chest pain , would perform diagnostic catheterization after work-up and treatment of infection is complete Continue aspirin, statin, Plavix, beta-blocker, and Entresto   3.  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   --Would avoid aggressive IV fluids  Continue beta-blocker and Entresto   4.  Sinus tachycardia:   rates typically in the low 100s.   higher in the setting of acute illness  Beta-blocker as tolerated  5.  Stage II-III chronic kidney disease:  Stable.  6.  Hyperlipidemia:  LDL was 50 in August.   Continue low-dose statin and Zetia therapy.  7.  Type 2 diabetes mellitus:  A1c was 7.9 in August.  Insulin management per internal medicine.  8.  Chronic pain:  uses oxycodone 10 mg every 6 hours at home.    [facility] will sign off.   Medication Recommendations: As detailed above Other recommendations (labs, testing, etc): Outpatient cardiac catheterization Follow up as an outpatient:  Dr. Fletcher Anon    Total encounter time more than 25 minutes  Greater than 50% was spent in counseling and coordination of care with the patient  For questions or updates, please contact [place] Please consult [email or web address] for contact info under        Signed, Ida Rogue, MD  07/22/2018, 6:46 PM

## 2018-07-22 NOTE — Progress Notes (Signed)
Patient ID: [name], male   DOB: 24-Aug-1961, 57 y.o.   MRN: [id number]  [facility] PROGRESS NOTE  [name] DOB: 1961-09-30 DOA: 07/20/2018 PCP: [name], MD  HPI/Subjective: Patient feeling better today.  Had some sweating.  No chills or fever.  No shortness of breath.  Objective: Vitals:   07/22/18 0511 07/22/18 0824  BP: 126/76 124/75  Pulse: (!) 106 (!) 104  Resp: 18 18  Temp: 98.8 F (37.1 C) 98.2 F (36.8 C)  SpO2: 97% 97%    Intake/Output Summary (Last 24 hours) at 07/22/2018 1358 Last data filed at 07/22/2018 0950 Gross per 24 hour  Intake 460 ml  Output -  Net 460 ml   Filed Weights   07/20/18 2033 07/21/18 0404 07/22/18 0450  Weight: 114.6 kg 107.6 kg 107.5 kg    ROS: Review of Systems  Constitutional: Negative for chills and fever.  Eyes: Negative for blurred vision.  Respiratory: Negative for cough and shortness of breath.   Cardiovascular: Negative for chest pain.  Gastrointestinal: Negative for abdominal pain, constipation, diarrhea, nausea and vomiting.  Genitourinary: Negative for dysuria.  Musculoskeletal: Negative for joint pain.  Neurological: Negative for dizziness and headaches.   Exam: Physical Exam  HENT:  Nose: No mucosal edema.  Mouth/Throat: No oropharyngeal exudate or posterior oropharyngeal edema.  Eyes: Pupils are equal, round, and reactive to light. Conjunctivae, EOM and lids are normal.  Neck: No JVD present. Carotid bruit is not present. No edema present. No thyroid mass and no thyromegaly present.  Cardiovascular: S1 normal and S2 normal. Exam reveals no gallop.  No murmur heard. Pulses:      Dorsalis pedis pulses are 2+ on the right side, and 2+ on the left side.  Respiratory: No respiratory distress. He has no wheezes. He has no rhonchi. He has no rales.  GI: Soft. Bowel sounds are normal. There is no tenderness.  Musculoskeletal:       Right ankle: He exhibits no swelling.        Left ankle: He exhibits no swelling.  Lymphadenopathy:    He has no cervical adenopathy.  Neurological: He is alert. No cranial nerve deficit.  Skin: Skin is warm. No rash noted. Nails show no clubbing.  Psychiatric: He has a normal mood and affect.      Data Reviewed: Basic Metabolic Panel: Recent Labs  Lab 07/20/18 2053 07/21/18 0445  NA 134* 136  K 4.2 4.8  CL 100 105  CO2 23 25  GLUCOSE 295* 324*  BUN 13 14  CREATININE 0.91 1.15  CALCIUM 8.6* 8.0*   Liver Function Tests: Recent Labs  Lab 07/20/18 2053  AST 37  ALT 25  ALKPHOS 70  BILITOT 0.7  PROT 6.0*  ALBUMIN 3.2*   Recent Labs  Lab 07/20/18 2053  LIPASE 21   CBC: Recent Labs  Lab 07/20/18 2053 07/21/18 0445  WBC 8.8 9.4  NEUTROABS 8.1*  --   HGB 11.2* 9.8*  HCT 34.6* 31.1*  MCV 89.2 90.1  PLT 303 270   Cardiac Enzymes: Recent Labs  Lab 07/20/18 2053 07/21/18 0314 07/21/18 0923 07/21/18 1507  TROPONINI 0.07* 0.07* 0.06* 0.05*    CBG: Recent Labs  Lab 07/21/18 1214 07/21/18 1638 07/21/18 2112 07/22/18 0804 07/22/18 1137  GLUCAP 307* 296* 262* 216* 274*    Recent Results (from the past 240 hour(s))  Blood Culture (routine x 2)     Status: None (Preliminary result)   Collection Time: 07/20/18  8:54 PM  Result Value Ref Range Status   Specimen Description BLOOD BLOOD RIGHT FOREARM  Final   Special Requests   Final    BOTTLES DRAWN AEROBIC AND ANAEROBIC Blood Culture adequate volume   Culture   Final    NO GROWTH 2 DAYS Performed at [facility], [address], [place], [place] [address]    Report Status PENDING  Incomplete  Blood Culture (routine x 2)     Status: None (Preliminary result)   Collection Time: 07/20/18  8:54 PM  Result Value Ref Range Status   Specimen Description BLOOD BLOOD RIGHT HAND  Final   Special Requests   Final    BOTTLES DRAWN AEROBIC AND ANAEROBIC Blood Culture adequate volume   Culture   Final    NO GROWTH 2 DAYS Performed at [facility], [address], [place], [place] [address]    Report Status PENDING  Incomplete  Respiratory Panel by PCR     Status: None   Collection Time: 07/21/18  8:52 PM  Result Value Ref Range Status   Adenovirus NOT DETECTED NOT DETECTED Final   Coronavirus 229E NOT DETECTED NOT DETECTED Final   Coronavirus HKU1 NOT DETECTED NOT DETECTED Final   Coronavirus NL63 NOT DETECTED NOT DETECTED Final   Coronavirus OC43 NOT DETECTED NOT DETECTED Final   Metapneumovirus NOT DETECTED NOT DETECTED Final   Rhinovirus / Enterovirus NOT DETECTED NOT DETECTED Final   Influenza A NOT DETECTED NOT DETECTED Final   Influenza B NOT DETECTED NOT DETECTED Final   Parainfluenza Virus 1 NOT DETECTED NOT DETECTED Final   Parainfluenza Virus 2 NOT DETECTED NOT DETECTED Final   Parainfluenza Virus 3 NOT DETECTED NOT DETECTED Final   Parainfluenza Virus 4 NOT DETECTED NOT DETECTED Final   Respiratory Syncytial Virus NOT DETECTED NOT DETECTED Final   Bordetella pertussis NOT DETECTED NOT DETECTED Final   Chlamydophila pneumoniae NOT DETECTED NOT DETECTED Final   Mycoplasma pneumoniae NOT DETECTED NOT DETECTED Final    Comment: Performed at [facility], [place] [address] [place], [place] [address]     Studies: Dg Chest 2 View  Result Date: 07/21/2018 CLINICAL DATA:  Fever nausea. Recent hospitalization for sepsis and colitis and went home on oral antibiotics. Symptoms recurred 2 days ago. EXAM: CHEST - 2 VIEW COMPARISON:  Chest x-ray of July 07, 2018 and July 20, 2018 FINDINGS: The lungs are mildly hypoinflated. The interstitial markings are increased bilaterally. The heart is normal in size. The pulmonary vascularity is not clearly engorged. There is no pleural effusion. The bony thorax exhibits no acute abnormality. IMPRESSION: Increased interstitial markings bilaterally may reflect interstitial edema of cardiac or noncardiac cause. No alveolar pneumonia. Electronically Signed   By: David  Martinique  M.D.   On: 07/21/2018 12:06   US Venous Img Lower Bilateral  Result Date: 07/22/2018 CLINICAL DATA:  Swelling, fever EXAM: BILATERAL LOWER EXTREMITY VENOUS DOPPLER ULTRASOUND TECHNIQUE: Gray-scale sonography with compression, as well as color and duplex ultrasound, were performed to evaluate the deep venous system from the level of the common femoral vein through the popliteal and proximal calf veins. COMPARISON:  None FINDINGS: Normal compressibility of the common femoral, superficial femoral, and popliteal veins, as well as the proximal calf veins. No filling defects to suggest DVT on grayscale or color Doppler imaging. Doppler waveforms show normal direction of venous flow, normal respiratory phasicity and response to augmentation. IMPRESSION: No evidence of  lower extremity deep vein thrombosis. Electronically Signed  By: Lucrezia Europe M.D.   On: 07/22/2018 09:41   Dg Chest Port 1 View  Result Date: 07/20/2018 CLINICAL DATA:  Fever. EXAM: PORTABLE CHEST 1 VIEW COMPARISON:  07/09/2018 FINDINGS: Shallow inspiration. Heart size and pulmonary vascularity are normal. Lungs are clear. No blunting of costophrenic angles. No pneumothorax. Mediastinal contours appear intact. IMPRESSION: No active disease. Electronically Signed   By: Lucienne Capers M.D.   On: 07/20/2018 21:56    Scheduled Meds: . aspirin EC  81 mg Oral Daily  . carvedilol  3.125 mg Oral BID WC  . clopidogrel  75 mg Oral Q breakfast  . enoxaparin (LOVENOX) injection  40 mg Subcutaneous Q24H  . fluticasone furoate-vilanterol  1 puff Inhalation Daily  . insulin glargine  15 Units Subcutaneous Daily  . insulin regular  0-9 Units Subcutaneous TID WC  . insulin regular  3 Units Subcutaneous TID WC  . iopamidol  15 mL Oral Q1 Hr x 2  . pantoprazole  40 mg Oral Daily  . rosuvastatin  5 mg Oral Daily   Continuous Infusions: . ceFEPime (MAXIPIME) IV Stopped (07/22/18 9179)    Assessment/Plan:   1. Clinical sepsis but unclear source.   Continue cefepime for right now.  Patient had fever and tachycardia.  So far cultures are negative.  Sonogram of the lower extremities negative for DVT.  Patient was recently in the hospital given Levaquin and Flagyl and discharged home and once he stopped this he spiked temperature.  Check CT scan of the chest abdomen and pelvis to make sure were not missing an abscess.  Case discussed with infectious disease. 2. Chronic combined systolic and diastolic congestive heart failure.  Continue Entresto.  Stop IV fluids. 3. Type 2 diabetes mellitus increase Lantus to 20 units daily and 5 units short acting insulin prior to meals 4. GERD on PPI 5. Hyperlipidemia on Crestor 6. History of CAD on aspirin Plavix and Crestor  Code Status:     Code Status Orders  (From admission, onward)         Start     Ordered   07/21/18 0341  Full code  Continuous     07/21/18 0341        Code Status History    Date Active Date Inactive Code Status Order ID Comments User Context   07/08/2018 0449 07/13/2018 1559 Full Code [id number]  Lance Coon, MD ED   04/25/2018 1517 04/26/2018 1417 Full Code [id number]  [name], MD Inpatient    Advance Directive Documentation     Most Recent Value  Type of Advance Directive  Living will, Healthcare Power of Attorney  Pre-existing out of facility DNR order (yellow form or pink MOST form)  -  "MOST" Form in Place?  -     Family Communication: Spoke with wife at the bedside Disposition Plan: To be determined  Consultants:  Infectious disease  Antibiotics:  Cefepime  Time spent: 28 minutes  [place]

## 2018-07-22 NOTE — Progress Notes (Signed)
Patient refused bipap.

## 2018-07-22 NOTE — Progress Notes (Signed)
[name] is a 57 y.o.male with  with a history of diabetes mellitus, myasthenia gravis, coronary artery disease status post multiple stents, diabetic peripheral neuropathy is admitted with chills and fever on 07/20/2018. Patient was recently in the hospital between 07/07/2018 until 07/13/2018 when he was admitted with fever and chills and was found to be septic with a very high procalcitonin.  CT abdomen had revealed bilateral symmetric perinephric edema with delayed excretion on delayed phase imaging, equal vocal bladder wall thickening and also inflammatory changes and wall thickening of the sigmoid colon with questionable pericolonic edema suspicious for colitis.  The procalcitonin was as high as 105 he was treated with IV vancomycin and cefepime and then was discharged home on Flagyl and Levaquin . He completed the Levaquin on Tuesday, 15 October.  He then started having chills on Wednesday and a temperature was more than 101 and so EMS was called and he was brought to the emergency department on 07/20/2018.  As per patient he had cardiac cath and stent placed in July and was doing cardiac rehab since then and was doing fine on 2 the last week of September when he had to skip 1 rehab because of feeling very tired.  On 07/07/2018 he had a routine pain management appointment for his low back pain.  He was feeling very tired when he was going to that appointment and even told his son to take him back home after that instead of going out to eat.  He did not receive any steroid injection that day.  He came to the ED the same evening as his family found him to be a little altered and he was also having chills.  His oxygen saturation was 85% on room air and when EMS arrived he had a temperature of 102.3.  He was admitted to the hospital between 07/07/2018 until 07/13/2018.  Patient also states during that hospitalization his urine was like kind of pink-colored which was never the case before.  He did not have any  diarrhea or abdominal pain during that hospitalization.  He did not have any cough or shortness of breath.  He states he is gained at least 10 pounds of weight after that hospitalization probably because of IV fluids he received for sepsis. He has myasthenia gravis and is on Mestinon and recently his neurologist Dr. Melrose Nakayama on 06/28/2018 started him on azathioprine.  Patient  took it for 2 to 3 days and then stopped it because he did not want to be on an  Immunosuppressant. Patient has not had any travel.  He does not engage in any risky outdoor activities including like white water rafting, fishing or hunting.  He does not do any yard work.  He has not had any recent tick bites or insect bites.  Received flu vaccine in August.  He has a dog and he  walks him on a path.  Patient has been treated many years ago when he was in [place] for Lyme disease by a Lyme specialist.  He said he received 1 year of IV ceftriaxone through a port.  Was later diagnosed with myasthenia gravis.  He then moved to [facility] and because of the altitude he eventually moved to [place].  His wife has Charcot-Marie-Tooth disease.  He lives with her and her 54 year old son. ? Subjective Feeling better Had night sweats apeptite improved Objective:  VITALS:   BP 120/72 (BP Location: Right Arm)   Pulse (!) 106   Temp 98.1  F (36.7 C) (Oral)   Resp 18   Ht 5\' 7"  (1.702 m)   Wt 107.5 kg   SpO2 96%   BMI 37.12 kg/m     PHYSICAL EXAM:  General: Alert, cooperative, no distress, appears stated age.  Head: Normocephalic, without obvious abnormality, atraumatic. Eyes: left ptosis,  ENT Nares normal. No drainage or sinus tenderness. Lips, mucosa, and tongue normal. No Thrush Neck: Supple, symmetrical, no adenopathy, thyroid: non tender no carotid bruit and no JVD. Back: No CVA tenderness. Lungs: Clear to auscultation bilaterally.  Heart: Regular rate and rhythm, no murmur, rub or gallop. Abdomen: Soft, non-tender,  distended. Bowel sounds normal. No masses Extremities: atraumatic, no cyanosis. +edema. No clubbing Skin: Faint macular rash on his left upper extremity  lymph: Cervical, supraclavicular normal. Neurologic: Not examined detail     pertinent Labs CBC Latest Ref Rng & Units 07/21/2018 07/20/2018 07/12/2018  WBC 4.0 - 10.5 K/uL 9.4 8.8 7.0  Hemoglobin 13.0 - 17.0 g/dL 9.8(L) 11.2(L) 10.4(L)  Hematocrit 39.0 - 52.0 % 31.1(L) 34.6(L) 30.4(L)  Platelets 150 - 400 K/uL 270 303 129(L)   CMP Latest Ref Rng & Units 07/21/2018 07/20/2018 07/12/2018  Glucose 70 - 99 mg/dL 324(H) 295(H) 232(H)  BUN 6 - 20 mg/dL 14 13 23(H)  Creatinine 0.61 - 1.24 mg/dL 1.15 0.91 1.02  Sodium 135 - 145 mmol/L 136 134(L) 140  Potassium 3.5 - 5.1 mmol/L 4.8 4.2 4.2  Chloride 98 - 111 mmol/L 105 100 113(H)  CO2 22 - 32 mmol/L 25 23 23   Calcium 8.9 - 10.3 mg/dL 8.0(L) 8.6(L) 7.5(L)  Total Protein 6.5 - 8.1 g/dL - 6.0(L) -  Total Bilirubin 0.3 - 1.2 mg/dL - 0.7 -  Alkaline Phos 38 - 126 U/L - 70 -  AST 15 - 41 U/L - 37 -  ALT 0 - 44 U/L - 25 -     Blood culture done on 07/07/2018 was negative MRSA by PCR of the nares was neg  07/20/2018 blood cultures so far is negative    10/18 2.65 IMAGING RESULTS: CT abdomen from 07/08/18 Short segment sigmoid colonic wall thickening with questionable pericolonic edema, suspicious for colitis. Motion artifact limits detailed assessment. Consider follow-up colonoscopy to exclude underlying neoplasm after resolution of acute event. Hepatic flexure through the distal descending colon are nondistended, limiting assessment for additional subtle colonic wall thickening. 2. Moderate colonic stool in the sigmoid colon, stool distends the rectum. 3. Bilateral perinephric edema, often chronic and related to underlying renal dysfunction, recommend correlation with urinalysis to exclude urinary tract infection.  ? Impression/Recommendation ?57 y.o.male with  with a  history of diabetes mellitus, myasthenia gravis, coronary artery disease status post multiple stents, diabetic peripheral neuropathy is admitted with chills and fever on 07/20/2018. Patient was recently in the hospital between 07/07/2018 until 07/13/2018 when he was admitted with fever and chills and was found to be septic with a very high procalcitonin.  CT abdomen had revealed bilateral symmetric perinephric edema with delayed excretion on delayed phase imaging, equal vocal bladder wall thickening and also inflammatory changes and wall thickening of the sigmoid colon with questionable pericolonic edema suspicious for colitis.  The procalcitonin was as high as 105 he was treated with IV vancomycin and cefepime and then was discharged home on Flagyl and Levaquin .  Recurrent fever and chills versus ongoing process.   Recent CT scan from 07/08/2018  showed colitis-like picture as well as perinephric edema bilaterally.  He also had a very high procalcitonin  that time which is indicative of an infection.  During this hospitalization he does not have any pneumonia or symptoms suggestive of a viral infection or urinary tract infection .  But he does complain of decreased urine output which could be secondary to him not drinking water as he used to do before. He had a cardiac cath in July and stent placement He has myasthenia gravis and is only on pyridostigmine and not on any immunosuppressants currently.  He only took azathioprine for 2 days.  he could have an intra-abdominal source versus urinary source.  Unlikely endocarditis. With procalcitonin increasing would recommend getting another CT abdomen and pelvis with contrast   He is currently on cefepime . change to Zosyn.  Myasthenia gravis:On pyridostigmine  Coronary artery disease status post multiple stents.  On Plavix and aspirin  CHF has an EF of 35 to 40%  Diabetes mellitus on triple therapy with metformin Victoza and insulin Discussed the  management with him and Dr.Wieting. ID will follow peripherally this weekend- text if needed.

## 2018-07-23 LAB — CBC
HEMATOCRIT: 28.9 % — AB (ref 39.0–52.0)
HEMOGLOBIN: 9.3 g/dL — AB (ref 13.0–17.0)
MCH: 28.7 pg (ref 26.0–34.0)
MCHC: 32.2 g/dL (ref 30.0–36.0)
MCV: 89.2 fL (ref 80.0–100.0)
Platelets: 265 10*3/uL (ref 150–400)
RBC: 3.24 MIL/uL — ABNORMAL LOW (ref 4.22–5.81)
RDW: 15.1 % (ref 11.5–15.5)
WBC: 4.3 10*3/uL (ref 4.0–10.5)
nRBC: 0 % (ref 0.0–0.2)

## 2018-07-23 LAB — BASIC METABOLIC PANEL
Anion gap: 7 (ref 5–15)
BUN: 13 mg/dL (ref 6–20)
CHLORIDE: 105 mmol/L (ref 98–111)
CO2: 27 mmol/L (ref 22–32)
Calcium: 8.4 mg/dL — ABNORMAL LOW (ref 8.9–10.3)
Creatinine, Ser: 1.07 mg/dL (ref 0.61–1.24)
GFR calc Af Amer: >60 mL/min (ref >60)
GFR calc non Af Amer: >60 mL/min (ref >60)
GLUCOSE: 210 mg/dL — AB (ref 70–99)
POTASSIUM: 4.5 mmol/L (ref 3.5–5.1)
SODIUM: 139 mmol/L (ref 135–145)

## 2018-07-23 LAB — GLUCOSE, CAPILLARY
GLUCOSE-CAPILLARY: 282 mg/dL — AB (ref 70–99)
GLUCOSE-CAPILLARY: 224 mg/dL — AB (ref 70–99)
GLUCOSE-CAPILLARY: 293 mg/dL — AB (ref 70–99)
Glucose-Capillary: 234 mg/dL — ABNORMAL HIGH (ref 70–99)

## 2018-07-23 LAB — SEDIMENTATION RATE: Sed Rate: 48 mm/hr — ABNORMAL HIGH (ref 0–20)

## 2018-07-23 LAB — PROCALCITONIN: PROCALCITONIN: 1.23 ng/mL

## 2018-07-23 LAB — AMMONIA: Ammonia: 21 umol/L (ref 9–35)

## 2018-07-23 NOTE — Evaluation (Signed)
Physical Therapy Evaluation Patient Details Name: [name] MRN: [id number] DOB: 1961/04/17 Today's Date: 07/23/2018   History of Present Illness  Pt is a 57 y.o. male presenting to hospital 07/20/18 with fever, N/V, and generalized weakness.  Pt with recent admit for sepsis from colitis.  Pt now admitted with sepsis.  PMH includes CHF, CAD, DM, peripheral neuropathy, htn, lyme disease, MI, myasthenia gravis, ptosis L eye, B CTR.  Clinical Impression  Prior to hospital admission, pt was ambulatory with [facility]; no falls in past 5 years; recently receiving HHPT (was in cardiac rehab prior to that).  Pt lives with his wife and adult son in 42 story condo with 5 steps to enter with B railings.  Currently pt is modified independent with bed mobility, CGA with transfers; and CGA ambulating around nursing loop with SPC.  Balance impairments noted with dynamic activities but pt able to self correct with SPC use.  L UE and L LE weakness noted compared to R side (pt reports h/o L side weakness and balance issues).  Encouraged pt to ambulate with staff assist during hospitalization.  Pt would benefit from skilled PT to address noted impairments and functional limitations (see below for any additional details).  Upon hospital discharge, recommend pt discharge to home with support of family and HHPT.    Follow Up Recommendations Home health PT    Equipment Recommendations  Cane    Recommendations for Other Services       Precautions / Restrictions Precautions Precautions: Fall Restrictions Weight Bearing Restrictions: No      Mobility  Bed Mobility Overal bed mobility: Modified Independent             General bed mobility comments: Semi-supine to/from sit without any noted difficulties.  Transfers Overall transfer level: Needs assistance Equipment used: Straight cane Transfers: Sit to/from Stand Sit to Stand: Min guard         General transfer comment: strong steady  stand  Ambulation/Gait Ambulation/Gait assistance: Min guard Gait Distance (Feet): 200 Feet Assistive device: Straight cane   Gait velocity: mildly decreased   General Gait Details: mild increased lateral sway and occasional altered stepping pattern but pt able to self correct with use of SPC; CGA for safety  Stairs            Wheelchair Mobility    Modified Rankin (Stroke Patients Only)       Balance Overall balance assessment: Needs assistance Sitting-balance support: No upper extremity supported;Feet supported Sitting balance-Leahy Scale: Normal Sitting balance - Comments: steady sitting reaching outside BOS   Standing balance support: No upper extremity supported Standing balance-Leahy Scale: Good Standing balance comment: steady standing reaching within BOS                             Pertinent Vitals/Pain Pain Assessment: 0-10 Pain Score: 4  Pain Location: chronic LBP Pain Descriptors / Indicators: Aching Pain Intervention(s): Limited activity within patient's tolerance;Monitored during session;Premedicated before session;Repositioned  Vitals (HR and O2 on room air) stable and WFL throughout treatment session.    Home Living Family/patient expects to be discharged to:: Private residence Living Arrangements: Spouse/significant other;Children(Wife and son) Available Help at Discharge: Family Type of Home: (condo) Home Access: Stairs to enter Entrance Stairs-Rails: Psychiatric nurse of Steps: 5 (1+4) Home Layout: One level Home Equipment: Shower seat - built in;Tub bench;Bedside commode;Cane - single point;Transport chair;Adaptive equipment      Prior Function Level  of Independence: Independent with assistive device(s)         Comments: Ambulatory with SPC.  Recently receiving HHPT (had cardiac rehab prior to that).  Son/wife assist with driving.  No falls in past 5 years.  H/o L sided weakness.     Hand Dominance         Extremity/Trunk Assessment   Upper Extremity Assessment Upper Extremity Assessment: RUE deficits/detail;LUE deficits/detail RUE Deficits / Details: good hand grip strength; 4+/5 elbow flexion/extension and shoulder flexion LUE Deficits / Details: good hand grip strength; 4/5 elbow flexion/extension and shoulder flexion    Lower Extremity Assessment RLE Deficits / Details: hip flexion 4+/5; knee flexion/extension 4+/5; DF 5/5 LLE Deficits / Details: hip flexion 4-/5; knee flexion/extension 4-/5; DF 4/5 LLE Sensation: history of peripheral neuropathy       Communication   Communication: No difficulties  Cognition Arousal/Alertness: Awake/alert Behavior During Therapy: WFL for tasks assessed/performed Overall Cognitive Status: Within Functional Limits for tasks assessed                                        General Comments   Nursing cleared pt for participation in physical therapy.  Pt agreeable to PT session.    Exercises     Assessment/Plan    PT Assessment Patient needs continued PT services  PT Problem List Decreased strength;Decreased activity tolerance;Decreased balance;Decreased mobility       PT Treatment Interventions DME instruction;Gait training;Stair training;Functional mobility training;Therapeutic activities;Therapeutic exercise;Balance training;Patient/family education    PT Goals (Current goals can be found in the Care Plan section)  Acute Rehab PT Goals Patient Stated Goal: return to Cardiac Rehab PT Goal Formulation: With patient Time For Goal Achievement: 08/06/18 Potential to Achieve Goals: Good    Frequency Min 2X/week   Barriers to discharge        Co-evaluation               AM-PAC PT "6 Clicks" Daily Activity  Outcome Measure Difficulty turning over in bed (including adjusting bedclothes, sheets and blankets)?: A Little Difficulty moving from lying on back to sitting on the side of the bed? : A  Little Difficulty sitting down on and standing up from a chair with arms (e.g., wheelchair, bedside commode, etc,.)?: Unable Help needed moving to and from a bed to chair (including a wheelchair)?: A Little Help needed walking in hospital room?: A Little Help needed climbing 3-5 steps with a railing? : A Little 6 Click Score: 16    End of Session Equipment Utilized During Treatment: Gait belt Activity Tolerance: Patient tolerated treatment well Patient left: in bed;with call bell/phone within reach(pt fall risk score of 8 (no alarm needed)) Nurse Communication: Mobility status;Precautions PT Visit Diagnosis: Unsteadiness on feet (R26.81);Difficulty in walking, not elsewhere classified (R26.2);Muscle weakness (generalized) (M62.81)    Time: 8563-1497 PT Time Calculation (min) (ACUTE ONLY): 23 min   Charges:   PT Evaluation $PT Eval Low Complexity: 1 Low PT Treatments $Therapeutic Exercise: 8-22 mins       Leitha Bleak, PT 07/23/18, 2:50 PM [phone]

## 2018-07-23 NOTE — Progress Notes (Signed)
Patient ID: [name], male   DOB: 1960-12-01, 57 y.o.   MRN: [id number]  [facility] PROGRESS NOTE  PIO EATHERLY QQI:[id number] DOB: 04-20-1961 DOA: 07/20/2018 PCP: [name], MD  HPI/Subjective: Patient states that he is feeling better denies any complaint Objective: Vitals:   07/22/18 2046 07/23/18 0433  BP: 131/86 (!) 141/81  Pulse: 100 98  Resp: 16 18  Temp: 97.9 F (36.6 C) 98 F (36.7 C)  SpO2: 98% 97%    Intake/Output Summary (Last 24 hours) at 07/23/2018 1434 Last data filed at 07/23/2018 0300 Gross per 24 hour  Intake 71.74 ml  Output -  Net 71.74 ml   Filed Weights   07/21/18 0404 07/22/18 0450 07/23/18 0500  Weight: 107.6 kg 107.5 kg 107.6 kg    ROS: Review of Systems  Constitutional: Negative for chills and fever.  Eyes: Negative for blurred vision.  Respiratory: Negative for cough and shortness of breath.   Cardiovascular: Negative for chest pain.  Gastrointestinal: Negative for abdominal pain, constipation, diarrhea, nausea and vomiting.  Genitourinary: Negative for dysuria.  Musculoskeletal: Negative for joint pain.  Neurological: Negative for dizziness and headaches.   Exam: Physical Exam  HENT:  Nose: No mucosal edema.  Mouth/Throat: No oropharyngeal exudate or posterior oropharyngeal edema.  Eyes: Pupils are equal, round, and reactive to light. Conjunctivae, EOM and lids are normal.  Neck: No JVD present. Carotid bruit is not present. No edema present. No thyroid mass and no thyromegaly present.  Cardiovascular: S1 normal and S2 normal. Exam reveals no gallop.  No murmur heard. Pulses:      Dorsalis pedis pulses are 2+ on the right side, and 2+ on the left side.  Respiratory: No respiratory distress. He has no wheezes. He has no rhonchi. He has no rales.  GI: Soft. Bowel sounds are normal. There is no tenderness.  Musculoskeletal:       Right ankle: He exhibits no swelling.       Left ankle: He exhibits no swelling.   Lymphadenopathy:    He has no cervical adenopathy.  Neurological: He is alert. No cranial nerve deficit.  Skin: Skin is warm. No rash noted. Nails show no clubbing.  Psychiatric: He has a normal mood and affect.      Data Reviewed: Basic Metabolic Panel: Recent Labs  Lab 07/20/18 2053 07/21/18 0445 07/23/18 0501  NA 134* 136 139  K 4.2 4.8 4.5  CL 100 105 105  CO2 23 25 27   GLUCOSE 295* 324* 210*  BUN 13 14 13   CREATININE 0.91 1.15 1.07  CALCIUM 8.6* 8.0* 8.4*   Liver Function Tests: Recent Labs  Lab 07/20/18 2053  AST 37  ALT 25  ALKPHOS 70  BILITOT 0.7  PROT 6.0*  ALBUMIN 3.2*   Recent Labs  Lab 07/20/18 2053  LIPASE 21   CBC: Recent Labs  Lab 07/20/18 2053 07/21/18 0445 07/23/18 0501  WBC 8.8 9.4 4.3  NEUTROABS 8.1*  --   --   HGB 11.2* 9.8* 9.3*  HCT 34.6* 31.1* 28.9*  MCV 89.2 90.1 89.2  PLT 303 270 265   Cardiac Enzymes: Recent Labs  Lab 07/20/18 2053 07/21/18 0314 07/21/18 0923 07/21/18 1507  TROPONINI 0.07* 0.07* 0.06* 0.05*    CBG: Recent Labs  Lab 07/22/18 1137 07/22/18 1705 07/22/18 2220 07/23/18 0755 07/23/18 1230  GLUCAP 274* 280* 242* 234* 282*    Recent Results (from the past 240 hour(s))  Blood Culture (routine x 2)  Status: None (Preliminary result)   Collection Time: 07/20/18  8:54 PM  Result Value Ref Range Status   Specimen Description BLOOD BLOOD RIGHT FOREARM  Final   Special Requests   Final    BOTTLES DRAWN AEROBIC AND ANAEROBIC Blood Culture adequate volume   Culture   Final    NO GROWTH 3 DAYS Performed at [facility], [address] [place], [place] [address]    Report Status PENDING  Incomplete  Blood Culture (routine x 2)     Status: None (Preliminary result)   Collection Time: 07/20/18  8:54 PM  Result Value Ref Range Status   Specimen Description BLOOD BLOOD RIGHT HAND  Final   Special Requests   Final    BOTTLES DRAWN AEROBIC AND ANAEROBIC Blood Culture adequate volume    Culture   Final    NO GROWTH 3 DAYS Performed at [facility], [address], [place], [place] [address]    Report Status PENDING  Incomplete  Respiratory Panel by PCR     Status: None   Collection Time: 07/21/18  8:52 PM  Result Value Ref Range Status   Adenovirus NOT DETECTED NOT DETECTED Final   Coronavirus 229E NOT DETECTED NOT DETECTED Final   Coronavirus HKU1 NOT DETECTED NOT DETECTED Final   Coronavirus NL63 NOT DETECTED NOT DETECTED Final   Coronavirus OC43 NOT DETECTED NOT DETECTED Final   Metapneumovirus NOT DETECTED NOT DETECTED Final   Rhinovirus / Enterovirus NOT DETECTED NOT DETECTED Final   Influenza A NOT DETECTED NOT DETECTED Final   Influenza B NOT DETECTED NOT DETECTED Final   Parainfluenza Virus 1 NOT DETECTED NOT DETECTED Final   Parainfluenza Virus 2 NOT DETECTED NOT DETECTED Final   Parainfluenza Virus 3 NOT DETECTED NOT DETECTED Final   Parainfluenza Virus 4 NOT DETECTED NOT DETECTED Final   Respiratory Syncytial Virus NOT DETECTED NOT DETECTED Final   Bordetella pertussis NOT DETECTED NOT DETECTED Final   Chlamydophila pneumoniae NOT DETECTED NOT DETECTED Final   Mycoplasma pneumoniae NOT DETECTED NOT DETECTED Final    Comment: Performed at [facility], [place] [address], [place], [place] [address]     Studies: Ct Chest W Contrast  Result Date: 07/22/2018 CLINICAL DATA:  Fever of unknown origin. EXAM: CT CHEST, ABDOMEN AND PELVIS WITHOUT CONTRAST TECHNIQUE: Multidetector CT imaging of the chest, abdomen and pelvis was performed following the standard protocol without IV contrast. COMPARISON:  None. FINDINGS: CT CHEST FINDINGS Cardiovascular: Conventional branch pattern of the great vessels with minimal atherosclerosis at the origins. Nonaneurysmal slightly atherosclerotic thoracic aorta without dissection. No acute pulmonary embolus to the proximal lobar levels. Left main and three-vessel coronary arteriosclerosis. No pericardial effusion.  Normal size heart. Mediastinum/Nodes: No enlarged mediastinal, hilar, or axillary lymph nodes. Thyroid gland, trachea, and esophagus demonstrate no significant findings. Lungs/Pleura: Subsegmental atelectasis at each lung base. No effusion or pneumothorax. No dominant mass. Musculoskeletal: No chest wall mass or suspicious bone lesions identified. CT ABDOMEN PELVIS FINDINGS Hepatobiliary: No focal liver abnormality is seen. No gallstones, gallbladder wall thickening, or biliary dilatation. Pancreas: Fatty replacement of the pancreas. No mass or ductal dilatation. Spleen: Normal in size without focal abnormality. Adrenals/Urinary Tract: Adrenal glands are unremarkable. Kidneys are normal, without renal calculi, focal lesion, or hydronephrosis. Bladder is unremarkable. Stomach/Bowel: Stomach is within normal limits. Appendix appears normal. No evidence of bowel wall thickening, distention, or inflammatory changes. Moderate stool retention within the right colon. No large bowel obstruction or inflammation. Minimal scattered left-sided  colonic diverticulosis without acute diverticulitis. Vascular/Lymphatic: Mild aortoiliac and branch vessel atherosclerosis. No lymphadenopathy. Reproductive: Normal size prostate with coarse central zone calcifications. Normal seminal vesicles. Other: Mild subcutaneous soft tissue anasarca. Musculoskeletal: Degenerative changes are noted along the dorsal spine. No acute nor suspicious osseous lesions. IMPRESSION: CT chest: 1. Left main and three-vessel coronary arteriosclerosis. 2. No active pulmonary disease. CT AP: 1. No acute solid nor hollow visceral organ abnormality to account for the patient's sepsis. 2. Mild subcutaneous soft tissue anasarca. 3. Scattered colonic diverticulosis without acute diverticulitis. 4. Fatty replaced pancreas. Electronically Signed   By: Ashley Royalty M.D.   On: 07/22/2018 18:00   Ct Abdomen Pelvis W Contrast  Result Date: 07/22/2018 CLINICAL DATA:   Fever of unknown origin. EXAM: CT CHEST, ABDOMEN AND PELVIS WITHOUT CONTRAST TECHNIQUE: Multidetector CT imaging of the chest, abdomen and pelvis was performed following the standard protocol without IV contrast. COMPARISON:  None. FINDINGS: CT CHEST FINDINGS Cardiovascular: Conventional branch pattern of the great vessels with minimal atherosclerosis at the origins. Nonaneurysmal slightly atherosclerotic thoracic aorta without dissection. No acute pulmonary embolus to the proximal lobar levels. Left main and three-vessel coronary arteriosclerosis. No pericardial effusion. Normal size heart. Mediastinum/Nodes: No enlarged mediastinal, hilar, or axillary lymph nodes. Thyroid gland, trachea, and esophagus demonstrate no significant findings. Lungs/Pleura: Subsegmental atelectasis at each lung base. No effusion or pneumothorax. No dominant mass. Musculoskeletal: No chest wall mass or suspicious bone lesions identified. CT ABDOMEN PELVIS FINDINGS Hepatobiliary: No focal liver abnormality is seen. No gallstones, gallbladder wall thickening, or biliary dilatation. Pancreas: Fatty replacement of the pancreas. No mass or ductal dilatation. Spleen: Normal in size without focal abnormality. Adrenals/Urinary Tract: Adrenal glands are unremarkable. Kidneys are normal, without renal calculi, focal lesion, or hydronephrosis. Bladder is unremarkable. Stomach/Bowel: Stomach is within normal limits. Appendix appears normal. No evidence of bowel wall thickening, distention, or inflammatory changes. Moderate stool retention within the right colon. No large bowel obstruction or inflammation. Minimal scattered left-sided colonic diverticulosis without acute diverticulitis. Vascular/Lymphatic: Mild aortoiliac and branch vessel atherosclerosis. No lymphadenopathy. Reproductive: Normal size prostate with coarse central zone calcifications. Normal seminal vesicles. Other: Mild subcutaneous soft tissue anasarca. Musculoskeletal: Degenerative  changes are noted along the dorsal spine. No acute nor suspicious osseous lesions. IMPRESSION: CT chest: 1. Left main and three-vessel coronary arteriosclerosis. 2. No active pulmonary disease. CT AP: 1. No acute solid nor hollow visceral organ abnormality to account for the patient's sepsis. 2. Mild subcutaneous soft tissue anasarca. 3. Scattered colonic diverticulosis without acute diverticulitis. 4. Fatty replaced pancreas. Electronically Signed   By: Ashley Royalty M.D.   On: 07/22/2018 18:00   US Venous Img Lower Bilateral  Result Date: 07/22/2018 CLINICAL DATA:  Swelling, fever EXAM: BILATERAL LOWER EXTREMITY VENOUS DOPPLER ULTRASOUND TECHNIQUE: Gray-scale sonography with compression, as well as color and duplex ultrasound, were performed to evaluate the deep venous system from the level of the common femoral vein through the popliteal and proximal calf veins. COMPARISON:  None FINDINGS: Normal compressibility of the common femoral, superficial femoral, and popliteal veins, as well as the proximal calf veins. No filling defects to suggest DVT on grayscale or color Doppler imaging. Doppler waveforms show normal direction of venous flow, normal respiratory phasicity and response to augmentation. IMPRESSION: No evidence of  lower extremity deep vein thrombosis. Electronically Signed   By: Lucrezia Europe M.D.   On: 07/22/2018 09:41    Scheduled Meds: . aspirin EC  81 mg Oral Daily  . carvedilol  3.125 mg Oral  BID WC  . clopidogrel  75 mg Oral Q breakfast  . enoxaparin (LOVENOX) injection  40 mg Subcutaneous Q24H  . fluticasone furoate-vilanterol  1 puff Inhalation Daily  . insulin glargine  20 Units Subcutaneous Daily  . insulin regular  0-9 Units Subcutaneous TID WC  . insulin regular  5 Units Subcutaneous TID WC  . pantoprazole  40 mg Oral Daily  . rosuvastatin  5 mg Oral Daily   Continuous Infusions: . piperacillin-tazobactam (ZOSYN)  IV 3.375 g (07/23/18 1045)     Assessment/Plan:   1. Clinical sepsis  unclear source.  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 Chronic combined systolic and diastolic congestive heart failure.  Continue Entresto.  Stop IV fluids. 3. Type 2 diabetes mellitus continue Lantus to 20 units daily and 5 units short acting insulin prior to meals 4. GERD on PPI 5. Hyperlipidemia on Crestor 6. History of CAD on aspirin Plavix and Crestor  Code Status:     Code Status Orders  (From admission, onward)         Start     Ordered   07/21/18 0341  Full code  Continuous     07/21/18 0341        Code Status History    Date Active Date Inactive Code Status Order ID Comments User Context   07/08/2018 0449 07/13/2018 1559 Full Code [id number]  Lance Coon, MD ED   04/25/2018 1517 04/26/2018 1417 Full Code [id number]  [name], MD Inpatient    Advance Directive Documentation     Most Recent Value  Type of Advance Directive  Living will, Healthcare Power of Attorney  Pre-existing out of facility DNR order (yellow form or pink MOST form)  -  "MOST" Form in Place?  -     Family Communication: Spoke with wife at the bedside Disposition Plan: To be determined  Consultants:  Infectious disease  Antibiotics:  Cefepime  Time spent: 28 minutes  [place]

## 2018-07-24 LAB — CBC
HCT: 31 % — ABNORMAL LOW (ref 39.0–52.0)
Hemoglobin: 10 g/dL — ABNORMAL LOW (ref 13.0–17.0)
MCH: 28.7 pg (ref 26.0–34.0)
MCHC: 32.3 g/dL (ref 30.0–36.0)
MCV: 89.1 fL (ref 80.0–100.0)
PLATELETS: 301 10*3/uL (ref 150–400)
RBC: 3.48 MIL/uL — ABNORMAL LOW (ref 4.22–5.81)
RDW: 14.8 % (ref 11.5–15.5)
WBC: 4.4 10*3/uL (ref 4.0–10.5)
nRBC: 0 % (ref 0.0–0.2)

## 2018-07-24 LAB — BASIC METABOLIC PANEL
ANION GAP: 7 (ref 5–15)
BUN: 13 mg/dL (ref 6–20)
CALCIUM: 8.6 mg/dL — AB (ref 8.9–10.3)
CO2: 28 mmol/L (ref 22–32)
CREATININE: 1.03 mg/dL (ref 0.61–1.24)
Chloride: 104 mmol/L (ref 98–111)
GFR calc Af Amer: >60 mL/min (ref >60)
GLUCOSE: 234 mg/dL — AB (ref 70–99)
Potassium: 4.2 mmol/L (ref 3.5–5.1)
Sodium: 139 mmol/L (ref 135–145)

## 2018-07-24 LAB — PROCALCITONIN: Procalcitonin: 0.67 ng/mL

## 2018-07-24 LAB — GLUCOSE, CAPILLARY: Glucose-Capillary: 234 mg/dL — ABNORMAL HIGH (ref 70–99)

## 2018-07-24 MED ORDER — AMOXICILLIN-POT CLAVULANATE 875-125 MG PO TABS: 1.0000 | ORAL_TABLET | Freq: Two times a day (BID) | ORAL | Status: DC

## 2018-07-24 MED ORDER — PROBIOTIC 250 MG PO CAPS: 1.0000 | ORAL_CAPSULE | Freq: Two times a day (BID) | ORAL | 8 refills | Status: AC

## 2018-07-24 MED ORDER — DOXYCYCLINE HYCLATE 100 MG PO TABS: 100.0000 mg | ORAL_TABLET | Freq: Two times a day (BID) | ORAL | 0 refills | Status: AC

## 2018-07-24 MED ORDER — AMOXICILLIN-POT CLAVULANATE 875-125 MG PO TABS: 1.0000 | ORAL_TABLET | Freq: Two times a day (BID) | ORAL | 0 refills | Status: AC

## 2018-07-24 MED ORDER — DOXYCYCLINE HYCLATE 100 MG PO TABS
100.0000 mg | ORAL_TABLET | Freq: Two times a day (BID) | ORAL | Status: DC
  Filled 2018-07-24 (×2): qty 1

## 2018-07-24 NOTE — Care Management Note (Signed)
Case Management Note  Patient Details  Name: [name] MRN: [id number] Date of Birth: 06-18-1961  Subjective/Objective:                  Patient to be discharged per MD order. Orders in place for home health services. Patient agreeable to resume services with [facility]. Notified Cheryl of discharge. No DME needs. Family to transport.    Action/Plan:   Expected Discharge Date:  07/24/18               Expected Discharge Plan:  [place]  In-House Referral:     Discharge planning Services  CM Consult  Post Acute Care Choice:    Choice offered to:  Patient  DME Arranged:    DME Agency:     HH Arranged:  RN, PT, OT HH Agency:  [facility]  Status of Service:  Completed, signed off  If discussed at [place] of Stay Meetings, dates discussed:    Additional Comments:  Latanya Maudlin, RN 07/24/2018, 12:15 PM

## 2018-07-24 NOTE — Discharge Summary (Signed)
[employer] [facility] [place] at [facility], 57 y.o., DOB 1961-03-09, MRN [id number]. 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 male who presents with chief complaint as above.  Patient presents from home with fever, chills, nausea.  He was admitted to our hospital about 2 weeks ago with sepsis from colitis.  Patient was treated with antibiotics and he finished course once he finished the course he started having fevers chills and also was confused.  Patient was started on broad-spectrum antibiotics.  Source of his fever was unidentified.  He had a CT scan of the chest abdomen which were all negative.  His urinalysis was negative.  His blood cultures have remained negative.  Patient underwent echocardiogram of the heart which was negative.  I have ordered titers for Lyme disease [place] spotted fever and a reticulosis the results of these are currently pending.  Patient is to follow-up with primary care provider for the results of these.  If he continues to experience fevers he needs to be seen by infectious disease physician as outpatient.  Primary care provider needs to refer him for this.            Consults  ID  Significant Tests:  See full reports for all details     Dg Chest  1 View  Result Date: 07/07/2018 CLINICAL DATA:  Shortness of breath. Altered mental status. EXAM: CHEST  1 VIEW COMPARISON:  01/27/2018. FINDINGS: Poor inspiration. Normal sized heart. Coronary artery stent. Clear lungs with normal vascularity. Thoracic spine degenerative changes. IMPRESSION: No acute abnormality. Electronically Signed   By: Claudie Revering M.D.   On: 07/07/2018 20:48   Dg Chest 2 View  Result Date: 07/21/2018 CLINICAL DATA:  Fever nausea. Recent hospitalization for sepsis and colitis and went home on oral antibiotics. Symptoms recurred 2 days ago. EXAM: CHEST - 2 VIEW COMPARISON:  Chest x-ray of July 07, 2018 and July 20, 2018 FINDINGS: The lungs are mildly hypoinflated. The interstitial markings are increased bilaterally. The heart is normal in size. The pulmonary vascularity is not clearly engorged. There is no pleural effusion. The bony thorax exhibits no acute abnormality. IMPRESSION: Increased interstitial markings bilaterally may reflect interstitial edema of cardiac or noncardiac cause. No alveolar pneumonia. Electronically Signed   By: David  Martinique M.D.   On: 07/21/2018 12:06   Ct Head Wo Contrast  Result Date: 07/08/2018 CLINICAL DATA:  Slurred speech, lethargy and confusion today. EXAM: CT HEAD WITHOUT CONTRAST TECHNIQUE: Contiguous axial images were obtained from the base of the skull through the vertex without intravenous contrast. COMPARISON:  None. FINDINGS: Brain: No evidence of acute infarction, hemorrhage, hydrocephalus, extra-axial collection or mass lesion/mass effect. Vascular: No hyperdense vessel or unexpected calcification. Skull: Normal. Negative for fracture or focal lesion. Sinuses/Orbits: Negative. Other: None. IMPRESSION: Negative head CT. Electronically Signed   By: Inge Rise  M.D.   On: 07/08/2018 12:42   Ct Chest W Contrast  Result Date: 07/22/2018 CLINICAL DATA:  Fever of unknown origin. EXAM: CT CHEST, ABDOMEN AND PELVIS WITHOUT CONTRAST  TECHNIQUE: Multidetector CT imaging of the chest, abdomen and pelvis was performed following the standard protocol without IV contrast. COMPARISON:  None. FINDINGS: CT CHEST FINDINGS Cardiovascular: Conventional branch pattern of the great vessels with minimal atherosclerosis at the origins. Nonaneurysmal slightly atherosclerotic thoracic aorta without dissection. No acute pulmonary embolus to the proximal lobar levels. Left main and three-vessel coronary arteriosclerosis. No pericardial effusion. Normal size heart. Mediastinum/Nodes: No enlarged mediastinal, hilar, or axillary lymph nodes. Thyroid gland, trachea, and esophagus demonstrate no significant findings. Lungs/Pleura: Subsegmental atelectasis at each lung base. No effusion or pneumothorax. No dominant mass. Musculoskeletal: No chest wall mass or suspicious bone lesions identified. CT ABDOMEN PELVIS FINDINGS Hepatobiliary: No focal liver abnormality is seen. No gallstones, gallbladder wall thickening, or biliary dilatation. Pancreas: Fatty replacement of the pancreas. No mass or ductal dilatation. Spleen: Normal in size without focal abnormality. Adrenals/Urinary Tract: Adrenal glands are unremarkable. Kidneys are normal, without renal calculi, focal lesion, or hydronephrosis. Bladder is unremarkable. Stomach/Bowel: Stomach is within normal limits. Appendix appears normal. No evidence of bowel wall thickening, distention, or inflammatory changes. Moderate stool retention within the right colon. No large bowel obstruction or inflammation. Minimal scattered left-sided colonic diverticulosis without acute diverticulitis. Vascular/Lymphatic: Mild aortoiliac and branch vessel atherosclerosis. No lymphadenopathy. Reproductive: Normal size prostate with coarse central zone calcifications. Normal seminal vesicles. Other: Mild subcutaneous soft tissue anasarca. Musculoskeletal: Degenerative changes are noted along the dorsal spine. No acute nor suspicious osseous  lesions. IMPRESSION: CT chest: 1. Left main and three-vessel coronary arteriosclerosis. 2. No active pulmonary disease. CT AP: 1. No acute solid nor hollow visceral organ abnormality to account for the patient's sepsis. 2. Mild subcutaneous soft tissue anasarca. 3. Scattered colonic diverticulosis without acute diverticulitis. 4. Fatty replaced pancreas. Electronically Signed   By: Ashley Royalty M.D.   On: 07/22/2018 18:00   Ct Abdomen Pelvis W Contrast  Result Date: 07/22/2018 CLINICAL DATA:  Fever of unknown origin. EXAM: CT CHEST, ABDOMEN AND PELVIS WITHOUT CONTRAST TECHNIQUE: Multidetector CT imaging of the chest, abdomen and pelvis was performed following the standard protocol without IV contrast. COMPARISON:  None. FINDINGS: CT CHEST FINDINGS Cardiovascular: Conventional branch pattern of the great vessels with minimal atherosclerosis at the origins. Nonaneurysmal slightly atherosclerotic thoracic aorta without dissection. No acute pulmonary embolus to the proximal lobar levels. Left main and three-vessel coronary arteriosclerosis. No pericardial effusion. Normal size heart. Mediastinum/Nodes: No enlarged mediastinal, hilar, or axillary lymph nodes. Thyroid gland, trachea, and esophagus demonstrate no significant findings. Lungs/Pleura: Subsegmental atelectasis at each lung base. No effusion or pneumothorax. No dominant mass. Musculoskeletal: No chest wall mass or suspicious bone lesions identified. CT ABDOMEN PELVIS FINDINGS Hepatobiliary: No focal liver abnormality is seen. No gallstones, gallbladder wall thickening, or biliary dilatation. Pancreas: Fatty replacement of the pancreas. No mass or ductal dilatation. Spleen: Normal in size without focal abnormality. Adrenals/Urinary Tract: Adrenal glands are unremarkable. Kidneys are normal, without renal calculi, focal lesion, or hydronephrosis. Bladder is unremarkable. Stomach/Bowel: Stomach is within normal limits. Appendix appears normal. No evidence of  bowel wall thickening, distention, or inflammatory changes. Moderate stool retention within the right colon. No large bowel obstruction or inflammation. Minimal scattered left-sided colonic diverticulosis without acute diverticulitis. Vascular/Lymphatic: Mild aortoiliac and branch vessel atherosclerosis. No lymphadenopathy. Reproductive: Normal size prostate with coarse central zone calcifications. Normal seminal vesicles. Other: Mild  subcutaneous soft tissue anasarca. Musculoskeletal: Degenerative changes are noted along the dorsal spine. No acute nor suspicious osseous lesions. IMPRESSION: CT chest: 1. Left main and three-vessel coronary arteriosclerosis. 2. No active pulmonary disease. CT AP: 1. No acute solid nor hollow visceral organ abnormality to account for the patient's sepsis. 2. Mild subcutaneous soft tissue anasarca. 3. Scattered colonic diverticulosis without acute diverticulitis. 4. Fatty replaced pancreas. Electronically Signed   By: Ashley Royalty M.D.   On: 07/22/2018 18:00   Ct Abdomen Pelvis W Contrast  Result Date: 07/07/2018 CLINICAL DATA:  Vomiting, fever, and altered mental status. EXAM: CT ABDOMEN AND PELVIS WITH CONTRAST TECHNIQUE: Multidetector CT imaging of the abdomen and pelvis was performed using the standard protocol following bolus administration of intravenous contrast. CONTRAST:  35m OMNIPAQUE IOHEXOL 300 MG/ML  SOLN COMPARISON:  None. FINDINGS: Lower chest: Breathing motion artifact. Subsegmental atelectasis in the right lower lobe. No confluent consolidation. No pleural fluid. There are coronary artery calcifications. Hepatobiliary: No focal hepatic abnormality. Mild gallbladder distention, motion artifact through the gallbladder limits assessment for wall thickening. No calcified gallstone. No biliary dilatation. Pancreas: Completely fatty replaced. No ductal dilatation or inflammation. Spleen: Normal in size without focal abnormality. Adrenals/Urinary Tract: Normal adrenal  glands. No hydronephrosis. Bilateral symmetric perinephric edema. Delayed excretion on delayed phase imaging. Urinary bladder is nondistended. Equivocal bladder wall thickening versus nondistention. Stomach/Bowel: Stomach physiologically distended. No small bowel wall thickening, inflammatory change or wall thickening, slight motion artifact limitations. Cecum is high-riding in the right mid abdomen. Normal appendix. Hepatic flexure through descending colon are nondistended, limiting assessment. Short segment proximal sigmoid colonic wall thickening with questionable pericolonic edema, image 71 series 2, partially obscured by motion. Moderate stool in the more distal sigmoid colon, stool distends the rectum which spans 7 cm. Vascular/Lymphatic: Moderate aortic and branch atherosclerosis. No enlarged lymph nodes in the abdomen or pelvis. Reproductive: Central prostatic calcifications. Other: Small amount of free fluid in the pelvis, likely reactive. No free air or intra-abdominal abscess. Scattered soft tissue densities in the anterior abdominal wall most consistent with medication injection sites. Musculoskeletal: There are no acute or suspicious osseous abnormalities. IMPRESSION: 1. Short segment sigmoid colonic wall thickening with questionable pericolonic edema, suspicious for colitis. Motion artifact limits detailed assessment. Consider follow-up colonoscopy to exclude underlying neoplasm after resolution of acute event. Hepatic flexure through the distal descending colon are nondistended, limiting assessment for additional subtle colonic wall thickening. 2. Moderate colonic stool in the sigmoid colon, stool distends the rectum. 3. Bilateral perinephric edema, often chronic and related to underlying renal dysfunction, recommend correlation with urinalysis to exclude urinary tract infection. 4. Complete fatty replacement of the pancreas, likely secondary to history of diabetes. 5. Aortic Atherosclerosis  (ICD10-I70.0). Coronary artery calcifications. Electronically Signed   By: MKeith RakeM.D.   On: 07/07/2018 23:32   [place]al  Result Date: 07/09/2018 CLINICAL DATA:  Acute renal failure. EXAM: RENAL / URINARY TRACT ULTRASOUND COMPLETE COMPARISON:  CT abdomen and pelvis 07/07/2018. FINDINGS: Right Kidney: Length: 11.2 cm. Echogenicity within normal limits. No mass or hydronephrosis visualized. Left Kidney: Length: 12.1 cm. Echogenicity within normal limits. No mass or hydronephrosis visualized. Bladder: Not visualized. IMPRESSION: Negative for hydronephrosis. The kidneys appear normal. The urinary bladder is not visualized likely due to decompression. Electronically Signed   By: TInge RiseM.D.   On: 07/09/2018 14:09   [place]ous Img Lower Bilateral  Result Date: 07/22/2018 CLINICAL DATA:  Swelling, fever EXAM: BILATERAL LOWER EXTREMITY VENOUS DOPPLER ULTRASOUND TECHNIQUE: Gray-scale  sonography with compression, as well as color and duplex ultrasound, were performed to evaluate the deep venous system from the level of the common femoral vein through the popliteal and proximal calf veins. COMPARISON:  None FINDINGS: Normal compressibility of the common femoral, superficial femoral, and popliteal veins, as well as the proximal calf veins. No filling defects to suggest DVT on grayscale or color Doppler imaging. Doppler waveforms show normal direction of venous flow, normal respiratory phasicity and response to augmentation. IMPRESSION: No evidence of  lower extremity deep vein thrombosis. Electronically Signed   By: Lucrezia Europe M.D.   On: 07/22/2018 09:41   Dg Chest Port 1 View  Result Date: 07/20/2018 CLINICAL DATA:  Fever. EXAM: PORTABLE CHEST 1 VIEW COMPARISON:  07/09/2018 FINDINGS: Shallow inspiration. Heart size and pulmonary vascularity are normal. Lungs are clear. No blunting of costophrenic angles. No pneumothorax. Mediastinal contours appear intact. IMPRESSION: No active disease.  Electronically Signed   By: Lucienne Capers M.D.   On: 07/20/2018 21:56   Dg Chest Port 1 View  Result Date: 07/09/2018 CLINICAL DATA:  Chronic congestive heart failure. Coronary artery disease and stents. EXAM: PORTABLE CHEST 1 VIEW COMPARISON:  07/07/2018. FINDINGS: A poor inspiration is again demonstrated with a grossly normal sized heart. Interval minimal right basilar atelectasis. Otherwise, clear lungs. Coronary artery stent. Thoracic spine degenerative changes. IMPRESSION: Poor inspiration with minimal right basilar atelectasis. Electronically Signed   By: Claudie Revering M.D.   On: 07/09/2018 11:42       Today   Subjective:   [name] patient doing much better denies any fevers or chills  Objective:   Blood pressure (!) 155/91, pulse (!) 105, temperature 97.7 F (36.5 C), temperature source Oral, resp. rate 18, height _0  (1.702 m), weight 107.3 kg, SpO2 99 %.  .  Intake/Output Summary (Last 24 hours) at 07/24/2018 1318 Last data filed at 07/24/2018 0300 Gross per 24 hour  Intake 208.12 ml  Output -  Net 208.12 ml    Exam VITAL SIGNS: Blood pressure (!) 155/91, pulse (!) 105, temperature 97.7 F (36.5 C), temperature source Oral, resp. rate 18, height _1  (1.702 m), weight 107.3 kg, SpO2 99 %.  GENERAL:  57 y.o.-year-old patient lying in the bed with no acute distress.  EYES: Pupils equal, round, reactive to light and accommodation. No scleral icterus. Extraocular muscles intact.  HEENT: Head atraumatic, normocephalic. Oropharynx and nasopharynx clear.  NECK:  Supple, no jugular venous distention. No thyroid enlargement, no tenderness.  LUNGS: Normal breath sounds bilaterally, no wheezing, rales,rhonchi or crepitation. No use of accessory muscles of respiration.  CARDIOVASCULAR: S1, S2 normal. No murmurs, rubs, or gallops.  ABDOMEN: Soft, nontender, nondistended. Bowel sounds present. No organomegaly or mass.  EXTREMITIES: No pedal edema, cyanosis, or clubbing.   NEUROLOGIC: Cranial nerves II through XII are intact. Muscle strength 5/5 in all extremities. Sensation intact. Gait not checked.  PSYCHIATRIC: The patient is alert and oriented x 3.  SKIN: No obvious rash, lesion, or ulcer.   Data Review     CBC w Diff:  Lab Results  Component Value Date   WBC 4.4 07/24/2018   HGB 10.0 (L) 07/24/2018   HGB 13.1 11/25/2017   HCT 31.0 (L) 07/24/2018   HCT 40.7 11/25/2017   PLT 301 07/24/2018   PLT 244 11/25/2017   LYMPHOPCT 2 07/20/2018   BANDSPCT 15 07/07/2018   MONOPCT 5 07/20/2018   EOSPCT 0 07/20/2018   BASOPCT 0 07/20/2018   CMP:  Lab  Results  Component Value Date   NA 139 07/24/2018   NA 139 11/25/2017   K 4.2 07/24/2018   CL 104 07/24/2018   CO2 28 07/24/2018   BUN 13 07/24/2018   BUN 21 11/25/2017   CREATININE 1.03 07/24/2018   PROT 6.0 (L) 07/20/2018   PROT 6.0 01/17/2018   ALBUMIN 3.2 (L) 07/20/2018   ALBUMIN 3.8 01/17/2018   BILITOT 0.7 07/20/2018   BILITOT 0.4 01/17/2018   ALKPHOS 70 07/20/2018   AST 37 07/20/2018   ALT 25 07/20/2018  .  Micro Results Recent Results (from the past 240 hour(s))  Blood Culture (routine x 2)     Status: None (Preliminary result)   Collection Time: 07/20/18  8:54 PM  Result Value Ref Range Status   Specimen Description BLOOD BLOOD RIGHT FOREARM  Final   Special Requests   Final    BOTTLES DRAWN AEROBIC AND ANAEROBIC Blood Culture adequate volume   Culture   Final    NO GROWTH 4 DAYS Performed at [facility], [address] [place], [place] [address]    Report Status PENDING  Incomplete  Blood Culture (routine x 2)     Status: None (Preliminary result)   Collection Time: 07/20/18  8:54 PM  Result Value Ref Range Status   Specimen Description BLOOD BLOOD RIGHT HAND  Final   Special Requests   Final    BOTTLES DRAWN AEROBIC AND ANAEROBIC Blood Culture adequate volume   Culture   Final    NO GROWTH 4 DAYS Performed at [facility], [address]  [place], [place] [address]    Report Status PENDING  Incomplete  Respiratory Panel by PCR     Status: None   Collection Time: 07/21/18  8:52 PM  Result Value Ref Range Status   Adenovirus NOT DETECTED NOT DETECTED Final   Coronavirus 229E NOT DETECTED NOT DETECTED Final   Coronavirus HKU1 NOT DETECTED NOT DETECTED Final   Coronavirus NL63 NOT DETECTED NOT DETECTED Final   Coronavirus OC43 NOT DETECTED NOT DETECTED Final   Metapneumovirus NOT DETECTED NOT DETECTED Final   Rhinovirus / Enterovirus NOT DETECTED NOT DETECTED Final   Influenza A NOT DETECTED NOT DETECTED Final   Influenza B NOT DETECTED NOT DETECTED Final   Parainfluenza Virus 1 NOT DETECTED NOT DETECTED Final   Parainfluenza Virus 2 NOT DETECTED NOT DETECTED Final   Parainfluenza Virus 3 NOT DETECTED NOT DETECTED Final   Parainfluenza Virus 4 NOT DETECTED NOT DETECTED Final   Respiratory Syncytial Virus NOT DETECTED NOT DETECTED Final   Bordetella pertussis NOT DETECTED NOT DETECTED Final   Chlamydophila pneumoniae NOT DETECTED NOT DETECTED Final   Mycoplasma pneumoniae NOT DETECTED NOT DETECTED Final    Comment: Performed at [facility], [place] [address] [place], [place] [address]        Code Status Orders  (From admission, onward)         Start     Ordered   07/21/18 0341  Full code  Continuous     07/21/18 0341        Code Status History    Date Active Date Inactive Code Status Order ID Comments User Context   07/08/2018 0449 07/13/2018 1559 Full Code [id number]  Lance Coon, MD ED   04/25/2018 1517 04/26/2018 1417 Full Code [id number]  [name], MD Inpatient    Advance Directive Documentation     Most Recent Value  Type of Advance Directive  Living  will, Healthcare Power of Attorney  Pre-existing out of facility DNR order (yellow form or pink MOST form)  -  "MOST" Form in Place?  -          Follow-up Information    [name], MD. Schedule an appointment as soon as possible for a  visit in 5 days.   Specialty:  Family Medicine Why:  HOSP F/U Contact information: [address] [place] [address] [phone]        Schedule an appointment as soon as possible for a visit with [name], MD.   Specialty:  Cardiology Contact information: [place] [address] [phone]           Discharge Medications   Allergies as of 07/24/2018      Reactions   Metoprolol Rash   Novolog [insulin Aspart] Hives      Medication List    STOP taking these medications   levofloxacin 500 MG tablet Commonly known as:  LEVAQUIN   metroNIDAZOLE 500 MG tablet Commonly known as:  FLAGYL     TAKE these medications   albuterol (2.5 MG/3ML) 0.083% nebulizer solution Commonly known as:  PROVENTIL Take 2.5 mg by nebulization every 6 (six) hours as needed for wheezing or shortness of breath.   albuterol 108 (90 Base) MCG/ACT inhaler Commonly known as:  PROVENTIL HFA;VENTOLIN HFA Inhale 2 puffs into the lungs every 6 (six) hours as needed for wheezing or shortness of breath.   amoxicillin-clavulanate 875-125 MG tablet Commonly known as:  AUGMENTIN Take 1 tablet by mouth every 12 (twelve) hours for 14 days.   aspirin EC 81 MG tablet Take 1 tablet (81 mg total) by mouth daily.   BREO ELLIPTA 200-25 MCG/INH Aepb Generic drug:  fluticasone furoate-vilanterol INHALE 1 PUFF INTO THE LUNGS DAILY What changed:  See the new instructions.   carvedilol 6.25 MG tablet Commonly known as:  COREG Take 1 tablet (6.25 mg total) by mouth 2 (two) times daily.   CLARITIN 10 MG tablet Generic drug:  loratadine Take 10 mg by mouth daily.   clopidogrel 75 MG tablet Commonly known as:  PLAVIX Take 1 tablet (75 mg total) by mouth daily with breakfast.   cyanocobalamin 1000 MCG tablet Take 1,000 mcg by mouth daily.   dexlansoprazole 60 MG capsule Commonly known as:  DEXILANT Take 1 capsule (60 mg total) by mouth daily.    doxycycline 100 MG tablet Commonly known as:  VIBRA-TABS Take 1 tablet (100 mg total) by mouth every 12 (twelve) hours for 14 days.   ezetimibe 10 MG tablet Commonly known as:  ZETIA Take 1 tablet (10 mg total) by mouth daily.   FIFTY50 PEN NEEDLES 32G X 4 MM Misc Generic drug:  Insulin Pen Needle Inject 1 each into the skin as needed.   gabapentin 300 MG capsule Commonly known as:  NEURONTIN Take 1 capsule (300 mg total) by mouth 3 (three) times daily.   HUMALOG KWIKPEN 100 UNIT/ML KiwkPen Generic drug:  insulin lispro Inject 7-8 Units into the skin 3 (three) times daily. Reported on 12/02/2015/ sliding scale 1 unit for every 8 units of carbs; and 1 unit for every 20 above 120   LANTUS SOLOSTAR 100 UNIT/ML Solostar Pen Generic drug:  Insulin Glargine Inject 25-30 Units into the skin daily at 10 pm.   levocetirizine 5 MG tablet Commonly known as:  XYZAL Take 1 tablet (5 mg total) by mouth every evening.   liraglutide  18 MG/3ML Sopn Commonly known as:  VICTOZA 1.8 units every morning What changed:    how much to take  how to take this  when to take this  additional instructions   metFORMIN 1000 MG tablet Commonly known as:  GLUCOPHAGE Take 1,000 mg by mouth 2 (two) times daily with a meal.   naloxone 4 MG/0.1ML Liqd nasal spray kit Commonly known as:  NARCAN For excess sedation from opioids   nitroGLYCERIN 0.3 MG SL tablet Commonly known as:  NITROSTAT Place 1 tablet (0.3 mg total) under the tongue every 5 (five) minutes as needed for chest pain. Reported on 12/02/2015   Probiotic 250 MG Caps Take 1 capsule by mouth 2 (two) times daily for 14 days.   rosuvastatin 5 MG tablet Commonly known as:  CRESTOR Take 5 mg by mouth daily.   sacubitril-valsartan 24-26 MG Commonly known as:  ENTRESTO Take 1 tablet by mouth 2 (two) times daily.          Total Time in preparing paper work, data evaluation and todays exam - 30 minutes  Dustin Flock M.D on  07/24/2018 at 1:18 PM [place]  [phone]

## 2018-07-25 LAB — CULTURE, BLOOD (ROUTINE X 2)
CULTURE: NO GROWTH
Culture: NO GROWTH
SPECIAL REQUESTS: ADEQUATE
SPECIAL REQUESTS: ADEQUATE

## 2018-07-25 LAB — B. BURGDORFI ANTIBODIES

## 2018-07-26 ENCOUNTER — Encounter: Payer: Self-pay | Admitting: Family Medicine

## 2018-07-26 ENCOUNTER — Ambulatory Visit (INDEPENDENT_AMBULATORY_CARE_PROVIDER_SITE_OTHER): Payer: Medicare Other | Admitting: Family Medicine

## 2018-07-26 VITALS — BP 102/64 | HR 102 | Temp 97.8°F | Resp 16 | Ht 67.0 in | Wt 235.3 lb

## 2018-07-26 DIAGNOSIS — D649 Anemia, unspecified: Secondary | ICD-10-CM

## 2018-07-26 DIAGNOSIS — R5383 Other fatigue: Secondary | ICD-10-CM | POA: Diagnosis not present

## 2018-07-26 DIAGNOSIS — Z8619 Personal history of other infectious and parasitic diseases: Secondary | ICD-10-CM

## 2018-07-26 DIAGNOSIS — F4321 Adjustment disorder with depressed mood: Secondary | ICD-10-CM

## 2018-07-26 DIAGNOSIS — M6281 Muscle weakness (generalized): Secondary | ICD-10-CM | POA: Diagnosis not present

## 2018-07-26 DIAGNOSIS — E46 Unspecified protein-calorie malnutrition: Secondary | ICD-10-CM

## 2018-07-26 DIAGNOSIS — K8689 Other specified diseases of pancreas: Secondary | ICD-10-CM

## 2018-07-26 DIAGNOSIS — Z09 Encounter for follow-up examination after completed treatment for conditions other than malignant neoplasm: Secondary | ICD-10-CM | POA: Diagnosis not present

## 2018-07-26 DIAGNOSIS — I700 Atherosclerosis of aorta: Secondary | ICD-10-CM

## 2018-07-26 LAB — EHRLICHIA ANTIBODY PANEL
E CHAFFEENSIS AB, IGG: NEGATIVE
E CHAFFEENSIS AB, IGM: NEGATIVE
E. Chaffeensis (HME) IgM Titer: NEGATIVE
E.Chaffeensis (HME) IgG: NEGATIVE

## 2018-07-26 LAB — RHEUMATOID FACTOR: Rhuematoid fact SerPl-aCnc: <10 IU/mL (ref 0.0–13.9)

## 2018-07-26 LAB — ROCKY MTN SPOTTED FVR ABS PNL(IGG+IGM)
RMSF IgG: NEGATIVE
RMSF IgM: 0.26 index (ref 0.00–0.89)

## 2018-07-26 NOTE — Progress Notes (Signed)
Name: [name]   MRN: [id number]    DOB: December 12, 1960   Date:07/26/2018       Progress Note  Subjective  Chief Complaint  Chief Complaint  Patient presents with  . Hospitalization Follow-up  . Back Pain    mid to low back pain (arthritis in spine)    HPI  Patient is here for hospital discharge follow up. His first admission was on 07/07/2018. He felt fatigued and took a nap on that day, he woke up confused and family called EMS who transported him to [facility]. He was found to be febrile, had acute on chronic renal insufficiency, he also had anemia, elevated troponin ( known CAD ) some straining pattern on colon and diagnosed with colitis and sepsis. His labs stabilized and he was discharged home on Flagyl and Levaquin, he was home for 5 days but fever returned and he went back to [facility] on 07/24/2018. He had multiple cultures done and all negative. His fever if of unclear etiology. He is now taking doxycycline. He states appetite is okay, but feels very tired, his sleep pattern is erratic and he has been feeling down and depressed for the past few weeks. He is afraid that fever will return and he will not be able to fight it. Explained that it may take a long time for him to feel better, but to stay hopeful and we will place referral to ID for possible reassurance or see if there is any other test that can be done.   Malnutrition: albumin down to 2.4 during hospital stay, pale and anemic  Patient Active Problem List   Diagnosis Date Noted  . Sepsis ([place]) 07/08/2018  . Colitis 07/08/2018  . OSA on CPAP 07/08/2018  . Acute renal failure superimposed on chronic kidney disease ([place]) 07/08/2018  . Unstable angina ([place]) 04/26/2018  . Ischemic cardiomyopathy 04/26/2018  . Chronic combined systolic and diastolic CHF (congestive heart failure) ([place]) 04/26/2018  . Effort angina ([place]) 04/25/2018  . 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[place]) 01/25/2018  . Hyperlipidemia due to type  2 diabetes mellitus ([place]) 08/12/2017  . Vitamin D deficiency, unspecified 08/12/2017  . Ptosis of left eyelid 01/08/2017  . Dermatitis 12/24/2016  . Difficulty walking 04/27/2016  . Foot cramps 04/27/2016  . Morbid (severe) obesity due to excess calories ([place]) 04/06/2016  . Benign neoplasm of sigmoid colon   . Benign neoplasm of descending colon   . Benign neoplasm of transverse colon   . Coronary artery disease involving native coronary artery with angina pectoris ([place]) 10/22/2015  . Coronary artery disease 08/22/2015  . DM (diabetes mellitus), type 2, uncontrolled with complications ([place]) 95/18/8416  . Myasthenia gravis ([place]) 08/22/2015  . Chronic left-sided low back pain 08/22/2015  . Hypertension 08/22/2015  . GERD (gastroesophageal reflux disease) 08/22/2015  . Arthritis 08/22/2015  . Diabetic peripheral neuropathy ([place]) 08/22/2015  . Lyme disease 08/22/2015  . Hyperlipidemia 08/22/2015  . Insomnia 08/22/2015  . Type 2 diabetes mellitus with diabetic polyneuropathy, with long-term current use of insulin ([place]) 08/22/2015    Past Surgical History:  Procedure Laterality Date  . BILATERAL CARPAL TUNNEL RELEASE Bilateral L in 2012 and R in 2013  . CARDIAC CATHETERIZATION     Several Caths, most recent in  March 2016.  [name] COLONOSCOPY WITH PROPOFOL N/A 01/10/2016   Procedure: COLONOSCOPY WITH PROPOFOL;  Surgeon: [name], MD;  Location: [facility] ENDOSCOPY;  Service: Endoscopy;  Laterality: N/A;  . CORONARY ANGIOPLASTY    .  CORONARY STENT INTERVENTION N/A 04/25/2018   Procedure: CORONARY STENT INTERVENTION;  Surgeon: [name], MD;  Location: [place] [facility];  Service: Cardiovascular;  Laterality: N/A;  . ESOPHAGOGASTRODUODENOSCOPY (EGD) WITH PROPOFOL N/A 01/10/2016   Procedure: ESOPHAGOGASTRODUODENOSCOPY (EGD) WITH PROPOFOL;  Surgeon: [name], MD;  Location: [facility] ENDOSCOPY;  Service: Endoscopy;  Laterality: N/A;  . EYE SURGERY Bilateral 2012   cataract/bilateral  vitrectomies  . LEFT HEART CATH AND CORONARY ANGIOGRAPHY Left 04/25/2018   Procedure: LEFT HEART CATH AND CORONARY ANGIOGRAPHY;  Surgeon: [name], MD;  Location: [place] [facility];  Service: Cardiovascular;  Laterality: Left;  . TONSILLECTOMY AND ADENOIDECTOMY     As a child  . TUNNELED VENOUS CATHETER PLACEMENT     removed    Family History  Problem Relation Age of Onset  . Diabetes Mother   . Heart disease Mother   . Cancer Father        Prostate CA  . Diabetes Brother   . Healthy Brother   . Healthy Brother     Social History   Socioeconomic History  . Marital status: Married    Spouse name: Neoma Laming  . Number of children: 0  . Years of education: Not on file  . Highest education level: Master's degree (e.g., MA, MS, MEng, MEd, MSW, MBA)  Occupational History  . Occupation: disabled    Comment: multiple medical problems - first approved for uncontrolled DM, but now has heart disease and Myasthenia Gravis   Social Needs  . Financial resource strain: Not hard at all  . Food insecurity:    Worry: Never true    Inability: Never true  . Transportation needs:    Medical: No    Non-medical: No  Tobacco Use  . Smoking status: Never Smoker  . Smokeless tobacco: Never Used  . Tobacco comment: smoking cessation materials not required  Substance and Sexual Activity  . Alcohol use: Not Currently    Alcohol/week: 0.0 standard drinks  . Drug use: No  . Sexual activity: Not Currently    Partners: Female  Lifestyle  . Physical activity:    Days per week: 7 days    Minutes per session: 30 min  . Stress: Not at all  Relationships  . Social connections:    Talks on phone: More than three times a week    Gets together: Three times a week    Attends religious service: More than 4 times per year    Active member of club or organization: Yes    Attends meetings of clubs or organizations: More than 4 times per year    Relationship status: Married  . Intimate partner  violence:    Fear of current or ex partner: No    Emotionally abused: No    Physically abused: No    Forced sexual activity: No  Other Topics Concern  . Not on file  Social History Narrative  . Not on file     Current Outpatient Medications:  .  albuterol (PROVENTIL HFA;VENTOLIN HFA) 108 (90 Base) MCG/ACT inhaler, Inhale 2 puffs into the lungs every 6 (six) hours as needed for wheezing or shortness of breath., Disp: , Rfl:  .  albuterol (PROVENTIL) (2.5 MG/3ML) 0.083% nebulizer solution, Take 2.5 mg by nebulization every 6 (six) hours as needed for wheezing or shortness of breath., Disp: , Rfl:  .  amoxicillin-clavulanate (AUGMENTIN) 875-125 MG tablet, Take 1 tablet by mouth every 12 (twelve) hours for 14 days., Disp: 28  tablet, Rfl: 0 .  aspirin EC 81 MG tablet, Take 1 tablet (81 mg total) by mouth daily., Disp: 90 tablet, Rfl: 3 .  BREO ELLIPTA 200-25 MCG/INH AEPB, INHALE 1 PUFF INTO THE LUNGS DAILY (Patient taking differently: Inhale 1 puff into the lungs daily. ), Disp: 1 each, Rfl: 3 .  carvedilol (COREG) 6.25 MG tablet, Take 1 tablet (6.25 mg total) by mouth 2 (two) times daily., Disp: 180 tablet, Rfl: 3 .  clopidogrel (PLAVIX) 75 MG tablet, Take 1 tablet (75 mg total) by mouth daily with breakfast., Disp: 30 tablet, Rfl: 6 .  cyanocobalamin 1000 MCG tablet, Take 1,000 mcg by mouth daily. , Disp: , Rfl:  .  dexlansoprazole (DEXILANT) 60 MG capsule, Take 1 capsule (60 mg total) by mouth daily., Disp: 30 capsule, Rfl: 6 .  doxycycline (VIBRA-TABS) 100 MG tablet, Take 1 tablet (100 mg total) by mouth every 12 (twelve) hours for 14 days., Disp: 28 tablet, Rfl: 0 .  ezetimibe (ZETIA) 10 MG tablet, Take 1 tablet (10 mg total) by mouth daily., Disp: 90 tablet, Rfl: 3 .  gabapentin (NEURONTIN) 300 MG capsule, Take 1 capsule (300 mg total) by mouth 3 (three) times daily., Disp: 90 capsule, Rfl: 1 .  insulin lispro (HUMALOG KWIKPEN) 100 UNIT/ML KiwkPen, Inject 7-8 Units into the skin 3 (three)  times daily. Reported on 12/02/2015/ sliding scale 1 unit for every 8 units of carbs; and 1 unit for every 20 above 120, Disp: , Rfl:  .  Insulin Pen Needle (FIFTY50 PEN NEEDLES) 32G X 4 MM MISC, Inject 1 each into the skin as needed., Disp: , Rfl:  .  LANTUS SOLOSTAR 100 UNIT/ML Solostar Pen, Inject 25-30 Units into the skin daily at 10 pm. , Disp: , Rfl: 0 .  levocetirizine (XYZAL) 5 MG tablet, Take 1 tablet (5 mg total) by mouth every evening., Disp: 30 tablet, Rfl: 3 .  liraglutide (VICTOZA) 18 MG/3ML SOPN, 1.8 units every morning (Patient taking differently: Inject 1.8 mg into the skin daily. ), Disp: , Rfl:  .  loratadine (CLARITIN) 10 MG tablet, Take 10 mg by mouth daily. , Disp: , Rfl:  .  metFORMIN (GLUCOPHAGE) 1000 MG tablet, Take 1,000 mg by mouth 2 (two) times daily with a meal., Disp: , Rfl:  .  naloxone (NARCAN) nasal spray 4 mg/0.1 mL, For excess sedation from opioids, Disp: 1 kit, Rfl: 2 .  nitroGLYCERIN (NITROSTAT) 0.3 MG SL tablet, Place 1 tablet (0.3 mg total) under the tongue every 5 (five) minutes as needed for chest pain. Reported on 12/02/2015, Disp: 25 tablet, Rfl: 3 .  rosuvastatin (CRESTOR) 5 MG tablet, Take 5 mg by mouth daily., Disp: , Rfl: 0 .  Saccharomyces boulardii (PROBIOTIC) 250 MG CAPS, Take 1 capsule by mouth 2 (two) times daily for 14 days., Disp: 28 capsule, Rfl: 8 .  sacubitril-valsartan (ENTRESTO) 24-26 MG, Take 1 tablet by mouth 2 (two) times daily., Disp: 60 tablet, Rfl: 6  Allergies  Allergen Reactions  . Metoprolol Rash  . Novolog [Insulin Aspart] Hives    I personally reviewed active problem list, medication list, allergies, family history, social history with the patient/caregiver today.   ROS  Constitutional: Negative for fever or weight change.  Respiratory: Negative for cough but has  shortness of breath with activity .   Cardiovascular: Negative for chest pain or palpitations.  Gastrointestinal: Negative for abdominal pain, no bowel changes.   Musculoskeletal: Negative for gait problem , just feels weak ( getting PT  at home) but no joint swelling.  Skin: Negative for rash.  Neurological: Negative for dizziness or headache.  No other specific complaints in a complete review of systems (except as listed in HPI above).  Objective  Vitals:   07/26/18 1446  BP: 102/64  Pulse: (!) 102  Resp: 16  Temp: 97.8 F (36.6 C)  TempSrc: Oral  SpO2: 96%  Weight: 235 lb 4.8 oz (106.7 kg)  Height: _0  (1.702 m)    Body mass index is 36.85 kg/m.  Physical Exam  Constitutional: Patient appears pale .Obese No distress.  HEENT: head atraumatic, normocephalic, pupils equal and reactive to light, neck supple, throat within normal limits Cardiovascular: Normal rate, regular rhythm and normal heart sounds.  No murmur heard. No BLE edema. Pulmonary/Chest: Eff[place] normal and breath sounds normal. No respiratory distress. Abdominal: Soft.  There is no tenderness. Psychiatric: Patient has a normal mood and affect. behavior is normal. Judgment and thought content normal.  PHQ2/9: Depression screen [facility] 2/9 07/26/2018 07/07/2018 06/21/2018 06/10/2018 05/12/2018  Decreased Interest 0 0 0 0 -  Down, Depressed, Hopeless 1 0 1 0 -  PHQ - 2 Score 1 0 1 0 -  Altered sleeping 2 - - 0 1  Tired, decreased energy 3 - - 0 2  Change in appetite 0 - - 0 0  Feeling bad or failure about yourself  0 - - 0 0  Trouble concentrating 1 - - 0 0  Moving slowly or fidgety/restless 0 - - 0 0  Suicidal thoughts 0 - - 0 0  PHQ-9 Score 7 - - 0 -  Difficult doing work/chores Somewhat difficult - - Not difficult at all Not difficult at all  Some recent data might be hidden     Fall Risk: Fall Risk  07/26/2018 07/07/2018 06/21/2018 06/10/2018 05/12/2018  Falls in the past year? _1   Comment - - - - -  Number falls in past yr: - - - - -  Comment - - - - -  Injury with Fall? - - - - -  Risk Factor Category  - - - - -  Risk for fall due to : - - - Impaired  balance/gait -  Risk for fall due to: Comment - - - - -  Follow up - - - - -      Functional Status Survey: Is the patient deaf or have difficulty hearing?: No Does the patient have difficulty seeing, even when wearing glasses/contacts?: No Does the patient have difficulty concentrating, remembering, or making decisions?: No Does the patient have difficulty walking or climbing stairs?: Yes Does the patient have difficulty dressing or bathing?: No Does the patient have difficulty doing errands alone such as visiting a doctor's office or shopping?: No   Assessment & Plan  1. Hospital discharge follow-up  Still taking antibiotics, medication reconciliation done  2. Fatty pancreas  On CT  3. Anemia, unspecified type  Recheck next visit   4. Muscle weakness  Expected based on recent hospital stay  5. Other fatigue  Likely multifactorial we will recheck labs during regular follow up in one month   6. Thoracic aorta atherosclerosis ([place])  Reviewed results with patient, CT chest , on statin therapy   7. History of sepsis  - Ambulatory referral to Infectious Disease  8. Situational depression  We will re-evaluate in 1 month, he will try to stay up during the day, continue to eat healthy, and increase physical activity as  tolerated   9. Protein malnutrition ([place])  Discussed high protein intake and protein shakes

## 2018-07-27 LAB — ANA COMPREHENSIVE PANEL
ENA SM Ab Ser-aCnc: <0.2 AI (ref 0.0–0.9)
Ribonucleic Protein: 3.4 AI — ABNORMAL HIGH (ref 0.0–0.9)
SSA (Ro) (ENA) Antibody, IgG: <0.2 AI (ref 0.0–0.9)
SSB (La) (ENA) Antibody, IgG: <0.2 AI (ref 0.0–0.9)
Scleroderma (Scl-70) (ENA) Antibody, IgG: <0.2 AI (ref 0.0–0.9)

## 2018-07-28 DIAGNOSIS — J4540 Moderate persistent asthma, uncomplicated: Secondary | ICD-10-CM | POA: Diagnosis not present

## 2018-07-28 DIAGNOSIS — I5042 Chronic combined systolic (congestive) and diastolic (congestive) heart failure: Secondary | ICD-10-CM | POA: Diagnosis not present

## 2018-07-28 DIAGNOSIS — I130 Hypertensive heart and chronic kidney disease with heart failure and stage 1 through stage 4 chronic kidney disease, or unspecified chronic kidney disease: Secondary | ICD-10-CM | POA: Diagnosis not present

## 2018-07-28 DIAGNOSIS — I2510 Atherosclerotic heart disease of native coronary artery without angina pectoris: Secondary | ICD-10-CM | POA: Diagnosis not present

## 2018-07-28 DIAGNOSIS — Z794 Long term (current) use of insulin: Secondary | ICD-10-CM | POA: Diagnosis not present

## 2018-07-28 DIAGNOSIS — E785 Hyperlipidemia, unspecified: Secondary | ICD-10-CM | POA: Diagnosis not present

## 2018-07-28 DIAGNOSIS — G7000 Myasthenia gravis without (acute) exacerbation: Secondary | ICD-10-CM | POA: Diagnosis not present

## 2018-07-28 DIAGNOSIS — M4697 Unspecified inflammatory spondylopathy, lumbosacral region: Secondary | ICD-10-CM | POA: Diagnosis not present

## 2018-07-28 DIAGNOSIS — A419 Sepsis, unspecified organism: Secondary | ICD-10-CM | POA: Diagnosis not present

## 2018-07-28 DIAGNOSIS — E1142 Type 2 diabetes mellitus with diabetic polyneuropathy: Secondary | ICD-10-CM | POA: Diagnosis not present

## 2018-07-28 DIAGNOSIS — R6520 Severe sepsis without septic shock: Secondary | ICD-10-CM | POA: Diagnosis not present

## 2018-07-28 DIAGNOSIS — A6923 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: Secondary | ICD-10-CM | POA: Diagnosis not present

## 2018-07-28 DIAGNOSIS — E1122 Type 2 diabetes mellitus with diabetic chronic kidney disease: Secondary | ICD-10-CM | POA: Diagnosis not present

## 2018-07-28 DIAGNOSIS — G4733 Obstructive sleep apnea (adult) (pediatric): Secondary | ICD-10-CM | POA: Diagnosis not present

## 2018-07-28 DIAGNOSIS — K219 Gastro-esophageal reflux disease without esophagitis: Secondary | ICD-10-CM | POA: Diagnosis not present

## 2018-07-28 DIAGNOSIS — N189 Chronic kidney disease, unspecified: Secondary | ICD-10-CM | POA: Diagnosis not present

## 2018-08-01 ENCOUNTER — Ambulatory Visit: Payer: Medicare Other | Admitting: Internal Medicine

## 2018-08-02 ENCOUNTER — Encounter: Payer: Self-pay | Admitting: *Deleted

## 2018-08-02 ENCOUNTER — Telehealth: Payer: Self-pay | Admitting: Cardiovascular Disease

## 2018-08-02 ENCOUNTER — Ambulatory Visit: Payer: Medicare Other | Admitting: Gastroenterology

## 2018-08-02 ENCOUNTER — Encounter: Payer: Self-pay | Admitting: Gastroenterology

## 2018-08-02 VITALS — BP 73/47 | HR 118 | Ht 67.0 in | Wt 231.0 lb

## 2018-08-02 DIAGNOSIS — E785 Hyperlipidemia, unspecified: Secondary | ICD-10-CM | POA: Diagnosis not present

## 2018-08-02 DIAGNOSIS — J4540 Moderate persistent asthma, uncomplicated: Secondary | ICD-10-CM | POA: Diagnosis not present

## 2018-08-02 DIAGNOSIS — G4733 Obstructive sleep apnea (adult) (pediatric): Secondary | ICD-10-CM | POA: Diagnosis not present

## 2018-08-02 DIAGNOSIS — I2510 Atherosclerotic heart disease of native coronary artery without angina pectoris: Secondary | ICD-10-CM | POA: Diagnosis not present

## 2018-08-02 DIAGNOSIS — M4697 Unspecified inflammatory spondylopathy, lumbosacral region: Secondary | ICD-10-CM | POA: Diagnosis not present

## 2018-08-02 DIAGNOSIS — E1142 Type 2 diabetes mellitus with diabetic polyneuropathy: Secondary | ICD-10-CM | POA: Diagnosis not present

## 2018-08-02 DIAGNOSIS — Z794 Long term (current) use of insulin: Secondary | ICD-10-CM | POA: Diagnosis not present

## 2018-08-02 DIAGNOSIS — I5042 Chronic combined systolic (congestive) and diastolic (congestive) heart failure: Secondary | ICD-10-CM | POA: Diagnosis not present

## 2018-08-02 DIAGNOSIS — I130 Hypertensive heart and chronic kidney disease with heart failure and stage 1 through stage 4 chronic kidney disease, or unspecified chronic kidney disease: Secondary | ICD-10-CM | POA: Diagnosis not present

## 2018-08-02 DIAGNOSIS — K529 Noninfective gastroenteritis and colitis, unspecified: Secondary | ICD-10-CM

## 2018-08-02 DIAGNOSIS — Z955 Presence of coronary angioplasty implant and graft: Secondary | ICD-10-CM

## 2018-08-02 DIAGNOSIS — G7000 Myasthenia gravis without (acute) exacerbation: Secondary | ICD-10-CM | POA: Diagnosis not present

## 2018-08-02 DIAGNOSIS — A419 Sepsis, unspecified organism: Secondary | ICD-10-CM | POA: Diagnosis not present

## 2018-08-02 DIAGNOSIS — N189 Chronic kidney disease, unspecified: Secondary | ICD-10-CM | POA: Diagnosis not present

## 2018-08-02 DIAGNOSIS — R6520 Severe sepsis without septic shock: Secondary | ICD-10-CM | POA: Diagnosis not present

## 2018-08-02 DIAGNOSIS — E1122 Type 2 diabetes mellitus with diabetic chronic kidney disease: Secondary | ICD-10-CM | POA: Diagnosis not present

## 2018-08-02 DIAGNOSIS — K219 Gastro-esophageal reflux disease without esophagitis: Secondary | ICD-10-CM | POA: Diagnosis not present

## 2018-08-02 DIAGNOSIS — A6923 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: Secondary | ICD-10-CM | POA: Diagnosis not present

## 2018-08-02 NOTE — Progress Notes (Signed)
Primary Care Physician: [name], MD  Primary Gastroenterologist:  Dr. [name]  Chief Complaint  Patient presents with  . Hospitalization Follow-up    HPI: [name] is a 57 y.o. male here after being in the hospital for a fever and presumed infection.  During the hospital stay the patient underwent a CT scan looking for the source of infection and was found to have a possible thickening of the sigmoid colon suggestive of possible colitis.  The patient reports that he did not have any diarrhea abdominal pain black stools or bloody stools.  The patient also had a moderate amount of stool in the colon on that CT.  The patient was sent home without a obvious source of his fevers and infection found.  The patient then subsequently came back to the hospital and had a repeat CT scan that did not show any repeat findings in the colon.  The patient was seen by infectious disease and has another appoint with infectious disease.  The patient states he is on antibiotics now and starts to feel poorly in the evening with chills and weakness.  He also had a CT scan of the chest that showed him to have significant coronary artery disease.  He was now sent to see me because of the abnormal CT scan on his first admission to the hospital.  He still continues to deny any GI symptoms.  Current Outpatient Medications  Medication Sig Dispense Refill  . albuterol (PROVENTIL HFA;VENTOLIN HFA) 108 (90 Base) MCG/ACT inhaler Inhale 2 puffs into the lungs every 6 (six) hours as needed for wheezing or shortness of breath.    [name] amoxicillin-clavulanate (AUGMENTIN) 875-125 MG tablet Take 1 tablet by mouth every 12 (twelve) hours for 14 days. 28 tablet 0  . aspirin EC 81 MG tablet Take 1 tablet (81 mg total) by mouth daily. 90 tablet 3  . BREO ELLIPTA 200-25 MCG/INH AEPB INHALE 1 PUFF INTO THE LUNGS DAILY (Patient taking differently: Inhale 1 puff into the lungs daily. ) 1 each 3  . carvedilol (COREG) 6.25  MG tablet Take 1 tablet (6.25 mg total) by mouth 2 (two) times daily. 180 tablet 3  . clopidogrel (PLAVIX) 75 MG tablet Take 1 tablet (75 mg total) by mouth daily with breakfast. 30 tablet 6  . cyanocobalamin 1000 MCG tablet Take 1,000 mcg by mouth daily.     [name] dexlansoprazole (DEXILANT) 60 MG capsule Take 1 capsule (60 mg total) by mouth daily. 30 capsule 6  . doxycycline (VIBRA-TABS) 100 MG tablet Take 1 tablet (100 mg total) by mouth every 12 (twelve) hours for 14 days. 28 tablet 0  . gabapentin (NEURONTIN) 300 MG capsule Take 1 capsule (300 mg total) by mouth 3 (three) times daily. 90 capsule 1  . insulin lispro (HUMALOG KWIKPEN) 100 UNIT/ML KiwkPen Inject 7-8 Units into the skin 3 (three) times daily. Reported on 12/02/2015/ sliding scale 1 unit for every 8 units of carbs; and 1 unit for every 20 above 120    . Insulin Pen Needle (FIFTY50 PEN NEEDLES) 32G X 4 MM MISC Inject 1 each into the skin as needed.    [name] LANTUS SOLOSTAR 100 UNIT/ML Solostar Pen Inject 25-30 Units into the skin daily at 10 pm.   0  . levocetirizine (XYZAL) 5 MG tablet Take 1 tablet (5 mg total) by mouth every evening. 30 tablet 3  . liraglutide (VICTOZA) 18 MG/3ML SOPN 1.8 units every morning (Patient taking differently: Inject 1.8  mg into the skin daily. )    . loratadine (CLARITIN) 10 MG tablet Take 10 mg by mouth daily.     . metFORMIN (GLUCOPHAGE) 1000 MG tablet Take 1,000 mg by mouth 2 (two) times daily with a meal.    . naloxone (NARCAN) nasal spray 4 mg/0.1 mL For excess sedation from opioids 1 kit 2  . nitroGLYCERIN (NITROSTAT) 0.3 MG SL tablet Place 1 tablet (0.3 mg total) under the tongue every 5 (five) minutes as needed for chest pain. Reported on 12/02/2015 25 tablet 3  . rosuvastatin (CRESTOR) 5 MG tablet Take 5 mg by mouth daily.  0  . Saccharomyces boulardii (PROBIOTIC) 250 MG CAPS Take 1 capsule by mouth 2 (two) times daily for 14 days. 28 capsule 8  . sacubitril-valsartan (ENTRESTO) 24-26 MG Take 1 tablet  by mouth 2 (two) times daily. 60 tablet 6  . albuterol (PROVENTIL) (2.5 MG/3ML) 0.083% nebulizer solution Take 2.5 mg by nebulization every 6 (six) hours as needed for wheezing or shortness of breath.    . ezetimibe (ZETIA) 10 MG tablet Take 1 tablet (10 mg total) by mouth daily. 90 tablet 3   No current facility-administered medications for this visit.     Allergies as of 08/02/2018 - Review Complete 08/02/2018  Allergen Reaction Noted  . Metoprolol Rash 03/03/2017  . Novolog [insulin aspart] Hives 09/24/2015    ROS:  General: Negative for anorexia, weight loss, fever, chills, fatigue, weakness. ENT: Negative for hoarseness, difficulty swallowing , nasal congestion. CV: Negative for chest pain, angina, palpitations, dyspnea on exertion, peripheral edema.  Respiratory: Negative for dyspnea at rest, dyspnea on exertion, cough, sputum, wheezing.  GI: See history of present illness. GU:  Negative for dysuria, hematuria, urinary incontinence, urinary frequency, nocturnal urination.  Endo: Negative for unusual weight change.    Physical Examination:   BP (!) 73/47   Pulse (!) 118   Ht '5\' 7"'$  (1.702 m)   Wt 231 lb (104.8 kg)   BMI 36.18 kg/m   General: Well-nourished, well-developed in no acute distress.  Eyes: No icterus. Conjunctivae pink. Mouth: Oropharyngeal mucosa moist and pink , no lesions erythema or exudate. Lungs: Clear to auscultation bilaterally. Non-labored. Heart: Regular rate and rhythm, no murmurs rubs or gallops.  Abdomen: Bowel sounds are normal, nontender, nondistended, no hepatosplenomegaly or masses, no abdominal bruits or hernia , no rebound or guarding.   Extremities: No lower extremity edema. No clubbing or deformities. Neuro: Alert and oriented x 3.  Grossly intact. Skin: Warm and dry, no jaundice.   Psych: Alert and cooperative, normal mood and affect.  Labs:    Imaging Studies: Dg Chest 1 View  Result Date: 07/07/2018 CLINICAL DATA:  Shortness of  breath. Altered mental status. EXAM: CHEST  1 VIEW COMPARISON:  01/27/2018. FINDINGS: Poor inspiration. Normal sized heart. Coronary artery stent. Clear lungs with normal vascularity. Thoracic spine degenerative changes. IMPRESSION: No acute abnormality. Electronically Signed   By: Claudie Revering M.D.   On: 07/07/2018 20:48   Dg Chest 2 View  Result Date: 07/21/2018 CLINICAL DATA:  Fever nausea. Recent hospitalization for sepsis and colitis and went home on oral antibiotics. Symptoms recurred 2 days ago. EXAM: CHEST - 2 VIEW COMPARISON:  Chest x-ray of July 07, 2018 and July 20, 2018 FINDINGS: The lungs are mildly hypoinflated. The interstitial markings are increased bilaterally. The heart is normal in size. The pulmonary vascularity is not clearly engorged. There is no pleural effusion. The bony thorax exhibits no acute abnormality.  IMPRESSION: Increased interstitial markings bilaterally may reflect interstitial edema of cardiac or noncardiac cause. No alveolar pneumonia. Electronically Signed   By: David  Martinique M.D.   On: 07/21/2018 12:06   Ct Head Wo Contrast  Result Date: 07/08/2018 CLINICAL DATA:  Slurred speech, lethargy and confusion today. EXAM: CT HEAD WITHOUT CONTRAST TECHNIQUE: Contiguous axial images were obtained from the base of the skull through the vertex without intravenous contrast. COMPARISON:  None. FINDINGS: Brain: No evidence of acute infarction, hemorrhage, hydrocephalus, extra-axial collection or mass lesion/mass effect. Vascular: No hyperdense vessel or unexpected calcification. Skull: Normal. Negative for fracture or focal lesion. Sinuses/Orbits: Negative. Other: None. IMPRESSION: Negative head CT. Electronically Signed   By: Inge Rise M.D.   On: 07/08/2018 12:42   Ct Chest W Contrast  Result Date: 07/22/2018 CLINICAL DATA:  Fever of unknown origin. EXAM: CT CHEST, ABDOMEN AND PELVIS WITHOUT CONTRAST TECHNIQUE: Multidetector CT imaging of the chest, abdomen and  pelvis was performed following the standard protocol without IV contrast. COMPARISON:  None. FINDINGS: CT CHEST FINDINGS Cardiovascular: Conventional branch pattern of the great vessels with minimal atherosclerosis at the origins. Nonaneurysmal slightly atherosclerotic thoracic aorta without dissection. No acute pulmonary embolus to the proximal lobar levels. Left main and three-vessel coronary arteriosclerosis. No pericardial effusion. Normal size heart. Mediastinum/Nodes: No enlarged mediastinal, hilar, or axillary lymph nodes. Thyroid gland, trachea, and esophagus demonstrate no significant findings. Lungs/Pleura: Subsegmental atelectasis at each lung base. No effusion or pneumothorax. No dominant mass. Musculoskeletal: No chest wall mass or suspicious bone lesions identified. CT ABDOMEN PELVIS FINDINGS Hepatobiliary: No focal liver abnormality is seen. No gallstones, gallbladder wall thickening, or biliary dilatation. Pancreas: Fatty replacement of the pancreas. No mass or ductal dilatation. Spleen: Normal in size without focal abnormality. Adrenals/Urinary Tract: Adrenal glands are unremarkable. Kidneys are normal, without renal calculi, focal lesion, or hydronephrosis. Bladder is unremarkable. Stomach/Bowel: Stomach is within normal limits. Appendix appears normal. No evidence of bowel wall thickening, distention, or inflammatory changes. Moderate stool retention within the right colon. No large bowel obstruction or inflammation. Minimal scattered left-sided colonic diverticulosis without acute diverticulitis. Vascular/Lymphatic: Mild aortoiliac and branch vessel atherosclerosis. No lymphadenopathy. Reproductive: Normal size prostate with coarse central zone calcifications. Normal seminal vesicles. Other: Mild subcutaneous soft tissue anasarca. Musculoskeletal: Degenerative changes are noted along the dorsal spine. No acute nor suspicious osseous lesions. IMPRESSION: CT chest: 1. Left main and three-vessel  coronary arteriosclerosis. 2. No active pulmonary disease. CT AP: 1. No acute solid nor hollow visceral organ abnormality to account for the patient's sepsis. 2. Mild subcutaneous soft tissue anasarca. 3. Scattered colonic diverticulosis without acute diverticulitis. 4. Fatty replaced pancreas. Electronically Signed   By: Ashley Royalty M.D.   On: 07/22/2018 18:00   Ct Abdomen Pelvis W Contrast  Result Date: 07/22/2018 CLINICAL DATA:  Fever of unknown origin. EXAM: CT CHEST, ABDOMEN AND PELVIS WITHOUT CONTRAST TECHNIQUE: Multidetector CT imaging of the chest, abdomen and pelvis was performed following the standard protocol without IV contrast. COMPARISON:  None. FINDINGS: CT CHEST FINDINGS Cardiovascular: Conventional branch pattern of the great vessels with minimal atherosclerosis at the origins. Nonaneurysmal slightly atherosclerotic thoracic aorta without dissection. No acute pulmonary embolus to the proximal lobar levels. Left main and three-vessel coronary arteriosclerosis. No pericardial effusion. Normal size heart. Mediastinum/Nodes: No enlarged mediastinal, hilar, or axillary lymph nodes. Thyroid gland, trachea, and esophagus demonstrate no significant findings. Lungs/Pleura: Subsegmental atelectasis at each lung base. No effusion or pneumothorax. No dominant mass. Musculoskeletal: No chest wall mass or suspicious bone  lesions identified. CT ABDOMEN PELVIS FINDINGS Hepatobiliary: No focal liver abnormality is seen. No gallstones, gallbladder wall thickening, or biliary dilatation. Pancreas: Fatty replacement of the pancreas. No mass or ductal dilatation. Spleen: Normal in size without focal abnormality. Adrenals/Urinary Tract: Adrenal glands are unremarkable. Kidneys are normal, without renal calculi, focal lesion, or hydronephrosis. Bladder is unremarkable. Stomach/Bowel: Stomach is within normal limits. Appendix appears normal. No evidence of bowel wall thickening, distention, or inflammatory changes.  Moderate stool retention within the right colon. No large bowel obstruction or inflammation. Minimal scattered left-sided colonic diverticulosis without acute diverticulitis. Vascular/Lymphatic: Mild aortoiliac and branch vessel atherosclerosis. No lymphadenopathy. Reproductive: Normal size prostate with coarse central zone calcifications. Normal seminal vesicles. Other: Mild subcutaneous soft tissue anasarca. Musculoskeletal: Degenerative changes are noted along the dorsal spine. No acute nor suspicious osseous lesions. IMPRESSION: CT chest: 1. Left main and three-vessel coronary arteriosclerosis. 2. No active pulmonary disease. CT AP: 1. No acute solid nor hollow visceral organ abnormality to account for the patient's sepsis. 2. Mild subcutaneous soft tissue anasarca. 3. Scattered colonic diverticulosis without acute diverticulitis. 4. Fatty replaced pancreas. Electronically Signed   By: Ashley Royalty M.D.   On: 07/22/2018 18:00   Ct Abdomen Pelvis W Contrast  Result Date: 07/07/2018 CLINICAL DATA:  Vomiting, fever, and altered mental status. EXAM: CT ABDOMEN AND PELVIS WITH CONTRAST TECHNIQUE: Multidetector CT imaging of the abdomen and pelvis was performed using the standard protocol following bolus administration of intravenous contrast. CONTRAST:  63m OMNIPAQUE IOHEXOL 300 MG/ML  SOLN COMPARISON:  None. FINDINGS: Lower chest: Breathing motion artifact. Subsegmental atelectasis in the right lower lobe. No confluent consolidation. No pleural fluid. There are coronary artery calcifications. Hepatobiliary: No focal hepatic abnormality. Mild gallbladder distention, motion artifact through the gallbladder limits assessment for wall thickening. No calcified gallstone. No biliary dilatation. Pancreas: Completely fatty replaced. No ductal dilatation or inflammation. Spleen: Normal in size without focal abnormality. Adrenals/Urinary Tract: Normal adrenal glands. No hydronephrosis. Bilateral symmetric perinephric  edema. Delayed excretion on delayed phase imaging. Urinary bladder is nondistended. Equivocal bladder wall thickening versus nondistention. Stomach/Bowel: Stomach physiologically distended. No small bowel wall thickening, inflammatory change or wall thickening, slight motion artifact limitations. Cecum is high-riding in the right mid abdomen. Normal appendix. Hepatic flexure through descending colon are nondistended, limiting assessment. Short segment proximal sigmoid colonic wall thickening with questionable pericolonic edema, image 71 series 2, partially obscured by motion. Moderate stool in the more distal sigmoid colon, stool distends the rectum which spans 7 cm. Vascular/Lymphatic: Moderate aortic and branch atherosclerosis. No enlarged lymph nodes in the abdomen or pelvis. Reproductive: Central prostatic calcifications. Other: Small amount of free fluid in the pelvis, likely reactive. No free air or intra-abdominal abscess. Scattered soft tissue densities in the anterior abdominal wall most consistent with medication injection sites. Musculoskeletal: There are no acute or suspicious osseous abnormalities. IMPRESSION: 1. Short segment sigmoid colonic wall thickening with questionable pericolonic edema, suspicious for colitis. Motion artifact limits detailed assessment. Consider follow-up colonoscopy to exclude underlying neoplasm after resolution of acute event. Hepatic flexure through the distal descending colon are nondistended, limiting assessment for additional subtle colonic wall thickening. 2. Moderate colonic stool in the sigmoid colon, stool distends the rectum. 3. Bilateral perinephric edema, often chronic and related to underlying renal dysfunction, recommend correlation with urinalysis to exclude urinary tract infection. 4. Complete fatty replacement of the pancreas, likely secondary to history of diabetes. 5. Aortic Atherosclerosis (ICD10-I70.0). Coronary artery calcifications. Electronically Signed    By: MAurther LoftD.  On: 07/07/2018 23:32   US Renal  Result Date: 07/09/2018 CLINICAL DATA:  Acute renal failure. EXAM: RENAL / URINARY TRACT ULTRASOUND COMPLETE COMPARISON:  CT abdomen and pelvis 07/07/2018. FINDINGS: Right Kidney: Length: 11.2 cm. Echogenicity within normal limits. No mass or hydronephrosis visualized. Left Kidney: Length: 12.1 cm. Echogenicity within normal limits. No mass or hydronephrosis visualized. Bladder: Not visualized. IMPRESSION: Negative for hydronephrosis. The kidneys appear normal. The urinary bladder is not visualized likely due to decompression. Electronically Signed   By: Inge Rise M.D.   On: 07/09/2018 14:09   US Venous Img Lower Bilateral  Result Date: 07/22/2018 CLINICAL DATA:  Swelling, fever EXAM: BILATERAL LOWER EXTREMITY VENOUS DOPPLER ULTRASOUND TECHNIQUE: Gray-scale sonography with compression, as well as color and duplex ultrasound, were performed to evaluate the deep venous system from the level of the common femoral vein through the popliteal and proximal calf veins. COMPARISON:  None FINDINGS: Normal compressibility of the common femoral, superficial femoral, and popliteal veins, as well as the proximal calf veins. No filling defects to suggest DVT on grayscale or color Doppler imaging. Doppler waveforms show normal direction of venous flow, normal respiratory phasicity and response to augmentation. IMPRESSION: No evidence of  lower extremity deep vein thrombosis. Electronically Signed   By: Lucrezia Europe M.D.   On: 07/22/2018 09:41   Dg Chest Port 1 View  Result Date: 07/20/2018 CLINICAL DATA:  Fever. EXAM: PORTABLE CHEST 1 VIEW COMPARISON:  07/09/2018 FINDINGS: Shallow inspiration. Heart size and pulmonary vascularity are normal. Lungs are clear. No blunting of costophrenic angles. No pneumothorax. Mediastinal contours appear intact. IMPRESSION: No active disease. Electronically Signed   By: Lucienne Capers M.D.   On: 07/20/2018 21:56    Dg Chest Port 1 View  Result Date: 07/09/2018 CLINICAL DATA:  Chronic congestive heart failure. Coronary artery disease and stents. EXAM: PORTABLE CHEST 1 VIEW COMPARISON:  07/07/2018. FINDINGS: A poor inspiration is again demonstrated with a grossly normal sized heart. Interval minimal right basilar atelectasis. Otherwise, clear lungs. Coronary artery stent. Thoracic spine degenerative changes. IMPRESSION: Poor inspiration with minimal right basilar atelectasis. Electronically Signed   By: Claudie Revering M.D.   On: 07/09/2018 11:42    Assessment and Plan:   [name] is a 57 y.o. y/o male who is in the hospital with a CT scan showing possible colitis with a repeat CT scan not showing any abnormality in the same part of the colon.  The patient has a follow-up with infectious disease and cardiology.  The patient has been told that he has a history of adenomatous polyps and should undergo a colonoscopy after he has seen the cardiologist and the infectious disease doctor.  He has also been told that the first CT scan could have represented some ischemic colitis with translocation of the bacteria into his bloodstream causing his sepsis although this is unlikely without any rectal bleeding diarrhea or abdominal cramping.  The patient has been explained the plan and agrees with it.    [name], MD. Marval Regal   Note: This dictation was prepared with Dragon dictation along with smaller phrase technology. Any transcriptional errors that result from this process are unintentional.

## 2018-08-02 NOTE — Telephone Encounter (Signed)
That blood pressure is too low.  He should hold both Coreg and Entresto.  If blood pressure continues to be low, he will need to go to the emergency room. If blood pressure improves by tomorrow, he can resume carvedilol without Entresto as long as systolic blood pressure is above 100.

## 2018-08-02 NOTE — Telephone Encounter (Signed)
Pt c/o BP issue: STAT if pt c/o blurred vision, one-sided weakness or slurred speech  1. What are your last 5 BP readings?  At doctor visit today 71/47  2. Are you having any other symptoms (ex. Dizziness, headache, blurred vision, passed out)? A little dizzy, had a recent bacterial infection with fever and chills  3. What is your BP issue? BP in afternoon has been running low.  Would like to know if he should not take night time dose of carvedilol. Please call to discuss

## 2018-08-02 NOTE — Telephone Encounter (Signed)
I called and spoke with the patient.  He states he was at the doctor today and his BP was 71/47. He is currently taking coreg 6.25 mg BID & entresto 24/26 mg BID.  He states that his lowest SBP reading that he knows he has had in the past has been in the 90's.  He is currently dealing with a "bacterial infection" since ~ 07/07/18. I asked if this was a respiratory infection/ blood infection. He advised "they don't know, and I'm not sure if I wasn't septic when this first started."  He is due to follow up with ID tomorrow in [place]. He reports he has been dizzy today. I have advised him to hold his coreg dose tonight, check his BP in the morning prior to his AM meds and is his SBP is still in the 70's he should hold his AM coreg until he hears back from our office.  The patient is aware I will forward to Dr. Fletcher Anon for further review and recommendations and we will call him back. He has also been advised to stay well hydrated and he verbalizes understanding.

## 2018-08-03 ENCOUNTER — Ambulatory Visit: Payer: Medicare Other | Admitting: Infectious Disease

## 2018-08-03 ENCOUNTER — Encounter: Payer: Self-pay | Admitting: Infectious Disease

## 2018-08-03 VITALS — BP 105/72 | HR 120 | Temp 98.6°F | Wt 231.8 lb

## 2018-08-03 DIAGNOSIS — R410 Disorientation, unspecified: Secondary | ICD-10-CM | POA: Diagnosis not present

## 2018-08-03 DIAGNOSIS — G7000 Myasthenia gravis without (acute) exacerbation: Secondary | ICD-10-CM

## 2018-08-03 DIAGNOSIS — R112 Nausea with vomiting, unspecified: Secondary | ICD-10-CM

## 2018-08-03 DIAGNOSIS — Z792 Long term (current) use of antibiotics: Secondary | ICD-10-CM

## 2018-08-03 DIAGNOSIS — Z888 Allergy status to other drugs, medicaments and biological substances status: Secondary | ICD-10-CM

## 2018-08-03 DIAGNOSIS — A419 Sepsis, unspecified organism: Secondary | ICD-10-CM | POA: Diagnosis not present

## 2018-08-03 DIAGNOSIS — K0889 Other specified disorders of teeth and supporting structures: Secondary | ICD-10-CM

## 2018-08-03 DIAGNOSIS — I2510 Atherosclerotic heart disease of native coronary artery without angina pectoris: Secondary | ICD-10-CM

## 2018-08-03 DIAGNOSIS — R509 Fever, unspecified: Secondary | ICD-10-CM | POA: Diagnosis not present

## 2018-08-03 DIAGNOSIS — E1142 Type 2 diabetes mellitus with diabetic polyneuropathy: Secondary | ICD-10-CM

## 2018-08-03 DIAGNOSIS — M5440 Lumbago with sciatica, unspecified side: Secondary | ICD-10-CM

## 2018-08-03 DIAGNOSIS — IMO0002 Reserved for concepts with insufficient information to code with codable children: Secondary | ICD-10-CM

## 2018-08-03 DIAGNOSIS — E1165 Type 2 diabetes mellitus with hyperglycemia: Secondary | ICD-10-CM

## 2018-08-03 DIAGNOSIS — E118 Type 2 diabetes mellitus with unspecified complications: Secondary | ICD-10-CM

## 2018-08-03 HISTORY — DX: Fever, unspecified: R50.9

## 2018-08-03 NOTE — Telephone Encounter (Signed)
I left a message for the patient to call back 

## 2018-08-03 NOTE — Progress Notes (Signed)
Reason for Consult: FUO with septic physiology   Subjective:    Patient ID: [name], male    DOB: 1960/10/10, 57 y.o.   MRN: [id number]  HPI  57 y.o.male with  with a history of diabetes mellitus, myasthenia gravis, coronary artery disease peripheral neuropathy and feeling relatively well until early October when he became abruptly confused and progressively lethargic on 3 October.  He was brought to the ER by it by his son.  He had some slurred speech and was confused.  In the ER hypotensive and with a lactic acidosis and thought to be suffering from severe sepsis.  Blood cultures were done a urine analysis and culture were done (which both were unrevealing with negative growth) CT the abdomen and pelvis was performed which suggested possible colitis./ His PCT was as high at 105. He was treated with IV vancomycin and cefepime and then was discharged home on Flagyl and Levaquin . During the stay he is current cognition improved dramatically on antibiotics and fluids he did not have however abdominal pain and only had had one loose bowel movement prior to admission. He completed the Levaquin on Tuesday, 15 October.  He then started having chills on Wednesday and a temperature was more than 101 and so EMS was called and he was brought to the emergency department on 07/20/2018 with chills and fever on 07/20/2018.  Is again hypotensive and with a lactic acidosis.  During this hospitalization he was again hypotensive and was treated with fluids and intravenous antibiotics form of cefepime and then Zosyn.  He was an inpatient through the 20th when he was discharged.  Work-up still during this hospitalization did not find a clear-cut source of his infection.  He had a CT chest abdomen pelvis which was again completely unrevealing.  He was discharged with Augmentin and doxycycline which she has both taken for now 10 days time.  Tested negative for HIV during that hospitalization.  His sedimentation  rate was elevated at 73  Been seen by infectious disease at [facility] Dr. Delaine Lame but I believe her clinic may not yet be up and running and/or separate referral had been made to [place] for ID consultation.    He has had trouble tolerating these antibiotics with several episodes of vomiting.  Since he has had 10 days of doxycycline I have asked him to please stop this now.  I certainly do not think that a tickborne infection such as ehrlichiosis or [facility] spotted fever would have been likely based on his presentation and would be extremely rare to see this happen with normal platelets and after treatment with a fluoroquinolone.  I do find instructive that he improved on systemic antibiotics and that he abruptly worsened once Levaquin and Flagyl were discontinued.  He has a history of having mid treated for Lyme disease.  He does sound like he had a erythema migrans infection and that he suffered from arthritis but then seemed to have been treated for more than a year for chronic symptoms of malaise fatigue and can and difficulty concentrating that sounds more like the noninfectious post Lyme phenomena.  However I wonder if this was a time where his myasthenia was being missed diagnosis as the diagnosis from I seen it was made after that.  He has had long-standing low back pain with sciatic cup.  In fact he was going to see a physician for potential corticosteroid injection when he became more confused.  Also noted irregularities and  elevated blood sugars recently which he has had in the past when dealing with infection.  I so I think aggressive imaging of his back is warranted and aggressive imaging for any potential source of infection is important.  He denies having recent dental pain and sees a dentist regularly though he does have several areas where caries have had fillings placed.  While he is a diabetic he has no evidence of diabetic foot ulcers on exam.  He does have a pin in  his left foot but the pain there has not increased and he has no overlying erythema.  Has had intermittent corticosteroids in the past but not continuous cortical steroid therapy.  I do think adrenal insufficiency could be considered but I am worried that he has recurrent bacterial infection that is not been diagnosed.  I have specific anxiety about the potential that he might have endocarditis or an infection smoldering in his lumbar spine.  Lived in [place] he is also lived in [place] he has traveled to the [place] once but otherwise no other foreign travel he has no history of exposure to anyone with tuberculosis that he knows of.  He denies any recreational drug use.   Past Medical History:  Diagnosis Date  . Allergy    dust, seasonal (worse in the fall).  . 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 Followed by Pain Specialist in [place], back and neck  . Asthma    BRONCHITIS  . Bacterial infection, unspecified    patient stated that it is unknown origin  . Cataract    First Dx in 2012  . Chronic combined systolic and diastolic congestive heart failure ([place])    a. 03/2018 Echo: EF 30-35%, ant, antlat, apical AK, Gr1 DD; b. 07/2018 Echo: EF 35-40%, anteroseptal, apical, and ant HK. Gr1 DD.  [name] Coronary artery disease    a. Prior Ant MI->s/p multiple stents placed in the LAD and right coronary artery ([place]); b. 2016 Cath: reportedly nonobs dzs;  c. 02/2017 MV: EF 45-54%, ap/ant, ap/inf, apical infarct, no ishcemia; c. 04/2018 Cath/PCI: LM nl, LAD 20p, patent mid stent, LCX [id number] [place] DES), OM1 nl, OM2 50, OM3 40 w/ patent stent, RCA 40p, 262m40d w/ patent stent in RPDA, RPAV 60, EF 25-35%. 2+MR.  . Diabetes mellitus without complication ([facility]   TYPE 2  . Diabetic peripheral neuropathy (HCC)    feet and hands  . FUO (fever of unknown origin) 08/03/2018  . GERD (gastroesophageal reflux disease)   . Headache    muscle tension  . Hyperlipidemia   . Hypertension    CONTROLLED  ON MEDS  . Insomnia   . Ischemic cardiomyopathy    a. 03/2018 Echo: EF 30-35%, ant, antlat, apical AK, Gr1 DD, mild MR, mildly dil LA; b. 07/2018 Echo: EF 35-40%.  . Lyme disease    Chronic  . My[place]ia gravis ([facility]  . [place] [place]  . Seasonal allergies   . Sleep apnea    CPAP    Past Surgical History:  Procedure Laterality Date  . BILATERAL CARPAL TUNNEL RELEASE Bilateral L in 2012 and R in 2013  . CARDIAC CATHETERIZATION     Several Caths, most recent in  March 2016.  . Marland KitchenOLONOSCOPY WITH PROPOFOL N/A 01/10/2016   Procedure: COLONOSCOPY WITH PROPOFOL;  Surgeon: DaLucilla LameMD;  Location: [facility] ENDOSCOPY;  Service: Endoscopy;  Laterality: N/A;  . CORONARY ANGIOPLASTY    . CORONARY STENT INTERVENTION N/A 04/25/2018  Procedure: CORONARY STENT INTERVENTION;  Surgeon: [name], MD;  Location: [facility];  Service: Cardiovascular;  Laterality: N/A;  . ESOPHAGOGASTRODUODENOSCOPY (EGD) WITH PROPOFOL N/A 01/10/2016   Procedure: ESOPHAGOGASTRODUODENOSCOPY (EGD) WITH PROPOFOL;  Surgeon: [name], MD;  Location: [facility] ENDOSCOPY;  Service: Endoscopy;  Laterality: N/A;  . EYE SURGERY Bilateral 2012   cataract/bilateral vitrectomies  . LEFT HEART CATH AND CORONARY ANGIOGRAPHY Left 04/25/2018   Procedure: LEFT HEART CATH AND CORONARY ANGIOGRAPHY;  Surgeon: [name], MD;  Location: [facility];  Service: Cardiovascular;  Laterality: Left;  . TONSILLECTOMY AND ADENOIDECTOMY     As a child  . TUNNELED VENOUS CATHETER PLACEMENT     removed    Family History  Problem Relation Age of Onset  . Diabetes Mother   . Heart disease Mother   . Cancer Father        Prostate CA  . Diabetes Brother   . Healthy Brother   . Healthy Brother       Social History   Socioeconomic History  . Marital status: Married    Spouse name: Neoma Laming  . Number of children: 0  . Years of education: Not on file  . Highest education level: Master's degree (e.g.,  MA, MS, MEng, MEd, MSW, MBA)  Occupational History  . Occupation: disabled    Comment: multiple medical problems - first approved for uncontrolled DM, but now has heart disease and Myasthenia Gravis   Social Needs  . Financial resource strain: Not hard at all  . Food insecurity:    Worry: Never true    Inability: Never true  . Transportation needs:    Medical: No    Non-medical: No  Tobacco Use  . Smoking status: Never Smoker  . Smokeless tobacco: Never Used  . Tobacco comment: smoking cessation materials not required  Substance and Sexual Activity  . Alcohol use: Not Currently    Alcohol/week: 0.0 standard drinks  . Drug use: No  . Sexual activity: Not Currently    Partners: Female  Lifestyle  . Physical activity:    Days per week: 7 days    Minutes per session: 30 min  . Stress: Not at all  Relationships  . Social connections:    Talks on phone: More than three times a week    Gets together: Three times a week    Attends religious service: More than 4 times per year    Active member of club or organization: Yes    Attends meetings of clubs or organizations: More than 4 times per year    Relationship status: Married  Other Topics Concern  . Not on file  Social History Narrative  . Not on file    Allergies  Allergen Reactions  . Metoprolol Rash  . Novolog [Insulin Aspart] Hives     Current Outpatient Medications:  .  albuterol (PROVENTIL HFA;VENTOLIN HFA) 108 (90 Base) MCG/ACT inhaler, Inhale 2 puffs into the lungs every 6 (six) hours as needed for wheezing or shortness of breath., Disp: , Rfl:  .  albuterol (PROVENTIL) (2.5 MG/3ML) 0.083% nebulizer solution, Take 2.5 mg by nebulization every 6 (six) hours as needed for wheezing or shortness of breath., Disp: , Rfl:  .  amoxicillin-clavulanate (AUGMENTIN) 875-125 MG tablet, Take 1 tablet by mouth every 12 (twelve) hours for 14 days., Disp: 28 tablet, Rfl: 0 .  aspirin EC 81 MG tablet, Take 1 tablet (81 mg total) by  mouth daily., Disp: 90 tablet,  Rfl: 3 .  BREO ELLIPTA 200-25 MCG/INH AEPB, INHALE 1 PUFF INTO THE LUNGS DAILY (Patient taking differently: Inhale 1 puff into the lungs daily. ), Disp: 1 each, Rfl: 3 .  carvedilol (COREG) 6.25 MG tablet, Take 1 tablet (6.25 mg total) by mouth 2 (two) times daily., Disp: 180 tablet, Rfl: 3 .  clopidogrel (PLAVIX) 75 MG tablet, Take 1 tablet (75 mg total) by mouth daily with breakfast., Disp: 30 tablet, Rfl: 6 .  cyanocobalamin 1000 MCG tablet, Take 1,000 mcg by mouth daily. , Disp: , Rfl:  .  dexlansoprazole (DEXILANT) 60 MG capsule, Take 1 capsule (60 mg total) by mouth daily., Disp: 30 capsule, Rfl: 6 .  doxycycline (VIBRA-TABS) 100 MG tablet, Take 1 tablet (100 mg total) by mouth every 12 (twelve) hours for 14 days., Disp: 28 tablet, Rfl: 0 .  gabapentin (NEURONTIN) 300 MG capsule, Take 1 capsule (300 mg total) by mouth 3 (three) times daily., Disp: 90 capsule, Rfl: 1 .  insulin lispro (HUMALOG KWIKPEN) 100 UNIT/ML KiwkPen, Inject 7-8 Units into the skin 3 (three) times daily. Reported on 12/02/2015/ sliding scale 1 unit for every 8 units of carbs; and 1 unit for every 20 above 120, Disp: , Rfl:  .  Insulin Pen Needle (FIFTY50 PEN NEEDLES) 32G X 4 MM MISC, Inject 1 each into the skin as needed., Disp: , Rfl:  .  LANTUS SOLOSTAR 100 UNIT/ML Solostar Pen, Inject 25-30 Units into the skin daily at 10 pm. , Disp: , Rfl: 0 .  levocetirizine (XYZAL) 5 MG tablet, Take 1 tablet (5 mg total) by mouth every evening., Disp: 30 tablet, Rfl: 3 .  liraglutide (VICTOZA) 18 MG/3ML SOPN, 1.8 units every morning (Patient taking differently: Inject 1.8 mg into the skin daily. ), Disp: , Rfl:  .  loratadine (CLARITIN) 10 MG tablet, Take 10 mg by mouth daily. , Disp: , Rfl:  .  metFORMIN (GLUCOPHAGE) 1000 MG tablet, Take 1,000 mg by mouth 2 (two) times daily with a meal., Disp: , Rfl:  .  naloxone (NARCAN) nasal spray 4 mg/0.1 mL, For excess sedation from opioids, Disp: 1 kit, Rfl: 2 .   nitroGLYCERIN (NITROSTAT) 0.3 MG SL tablet, Place 1 tablet (0.3 mg total) under the tongue every 5 (five) minutes as needed for chest pain. Reported on 12/02/2015, Disp: 25 tablet, Rfl: 3 .  rosuvastatin (CRESTOR) 5 MG tablet, Take 5 mg by mouth daily., Disp: , Rfl: 0 .  Saccharomyces boulardii (PROBIOTIC) 250 MG CAPS, Take 1 capsule by mouth 2 (two) times daily for 14 days., Disp: 28 capsule, Rfl: 8 .  sacubitril-valsartan (ENTRESTO) 24-26 MG, Take 1 tablet by mouth 2 (two) times daily., Disp: 60 tablet, Rfl: 6 .  ezetimibe (ZETIA) 10 MG tablet, Take 1 tablet (10 mg total) by mouth daily., Disp: 90 tablet, Rfl: 3    Review of Systems  Constitutional: Positive for activity change, appetite change, chills, diaphoresis, fatigue and fever.  HENT: Negative for congestion, dental problem, ear pain, facial swelling, hearing loss, mouth sores, postnasal drip, rhinorrhea, sinus pressure, sinus pain, sneezing and sore throat.   Eyes: Negative for photophobia.  Respiratory: Negative for cough, shortness of breath and wheezing.   Cardiovascular: Negative for chest pain, palpitations and leg swelling.  Gastrointestinal: Positive for nausea and vomiting. Negative for abdominal distention, abdominal pain, blood in stool, constipation, diarrhea and rectal pain.  Endocrine: Negative for polyuria.  Genitourinary: Negative for dysuria, flank pain and hematuria.  Musculoskeletal: Negative for back pain and  myalgias.  Skin: Negative for rash.  Neurological: Negative for dizziness, weakness and headaches.  Hematological: Does not bruise/bleed easily.  Psychiatric/Behavioral: Positive for confusion and decreased concentration. Negative for agitation, dysphoric mood, hallucinations, self-injury, sleep disturbance and suicidal ideas. The patient is not nervous/anxious and is not hyperactive.        Objective:   Physical Exam  Constitutional: He is oriented to person, place, and time. He appears well-developed and  well-nourished.  HENT:  Head: Normocephalic and atraumatic.  Mouth/Throat: Oropharynx is clear and moist. Abnormal dentition. No oropharyngeal exudate.    Eyes: Conjunctivae and EOM are normal. Right eye exhibits no chemosis, no discharge and no exudate. Left eye exhibits no discharge. Right conjunctiva is not injected. Right conjunctiva has no hemorrhage. Left conjunctiva has no hemorrhage.    Neck: Normal range of motion. Neck supple.  Cardiovascular: Normal rate, regular rhythm, normal heart sounds and intact distal pulses. Exam reveals no gallop and no friction rub.  No murmur heard. Pulmonary/Chest: Effort normal and breath sounds normal. No stridor. No respiratory distress. He has no wheezes. He has no rales. He exhibits no tenderness.  Abdominal: Soft. Bowel sounds are normal. He exhibits no distension and no mass. There is no tenderness. There is no guarding.  Musculoskeletal: Normal range of motion. He exhibits no edema or tenderness.  Neurological: He is alert and oriented to person, place, and time.  Skin: Skin is warm and dry. No rash noted. No erythema. No pallor.  Psychiatric: He has a normal mood and affect. His behavior is normal. Judgment and thought content normal.   No DFU, no new rashes  Area where pin is in place is not tender      Assessment & Plan:   Recurrent FUO with septic physiology, lactic acidosis:  These nodes have responded to intravenous fluids and antibiotic therapy and he has worsened when he discontinued Levaquin and Flagyl within 24 hours.  I am quite concerned that he may have a serious bacterial infection that has not been diagnosed and that will require aggressive treatment.  Given your use I will check serum cortisol that is not early in the morning at this point time.  Given his long-standing lower back pain and lack of recent imaging I will get an MRI of his lumbar spine with and without contrast to look for deep infection at this  site.  I am ordering a Panorex of his teeth today to look for odontogenic site of infection.  Need an odontogenic site of a bacteremia and potential endocarditis is 1 of the things that is high my list of concerns.  He has had a recent transthoracic echocardiogram that did not show evidence of vegetations but I would strongly consider a transesophageal echocardiogram.  I have asked him to stop his doxycycline.  I will allow him to continue his Augmentin through the weekend given that he worsened abruptly after stopping antibiotics before though I certainlydo not like giving abx without knowing what we are treating.  I will obtain blood cultures today though they are likely to be low yield given that he is been on antibiotics and is on antibiotics today.  I will repeat a sed rate and C-reactive protein along with a CBC with differential and conference of metabolic panel.  I do not think that he would have an Epstein-Barr related malignancy that be causing this but for thoroughness I will check Epstein-Barr virus titers along with CMV titers.  Again for thoroughness sake I will check  a QuantiFERON gold  Check an LDH.  He has a history of precancerous polyps that have been removed and is been followed very closely by Dr. [name] whom he saw yesterday.     I will repeat convalescent titers for [facility] spotted fever and ehrlichiosis though I doubt very much that any of these have anything to do with what is going on  I will send a note to his cardiologist about trying to get a transesophageal echocardiogram.  We could also consider MRI brain as part of workup  I spent greater than 60 minutes with the patient including greater than 50% of time in face to face counsel of the patient and his wife reviewing his a fever of unknown origin, sepsis work-up that had been done so far and then needed to be done and in coordination of his care.  Concerned that he may decompensate once he comes  off antibiotics yet again.  I have asked him to follow-up with his primary care physician next week and he is going to be spelt follow-up with me in several weeks.  Certainly should you have worsening clinically he is to get in touch with [place] immediately as well as primary care hopefully can be admitted through primary care office or our office or cardiology rather than through the ER should he need it.

## 2018-08-03 NOTE — Telephone Encounter (Signed)
I spoke with the patient. He did check his BP (HR) at home this morning and he was 105/80 (101) He had an appointment with ID- BP (HR) at that visit was 101/71 (120).   The patient states he can run a HR of ~ 100 bpm. He feels like he was anxious this morning with having to go to another MD appointment.  He did not take any medications this morning.  I have advised him of Dr. Tyrell Antonio recommendations to resume carvedilol without entresto for SBP > 100.  The patient voices understanding and is agreeable. He will take a dose of coreg now. I have advised him if his afternoon BP runs low as it did yesterday, to call us back prior to 5 pm.  The patient voices understanding and is agreeable.   He has an appointment scheduled on 08/18/18 with Ignacia Bayley, NP.

## 2018-08-04 ENCOUNTER — Encounter: Payer: Self-pay | Admitting: *Deleted

## 2018-08-04 ENCOUNTER — Telehealth: Payer: Self-pay | Admitting: *Deleted

## 2018-08-04 DIAGNOSIS — E1122 Type 2 diabetes mellitus with diabetic chronic kidney disease: Secondary | ICD-10-CM | POA: Diagnosis not present

## 2018-08-04 DIAGNOSIS — G4733 Obstructive sleep apnea (adult) (pediatric): Secondary | ICD-10-CM | POA: Diagnosis not present

## 2018-08-04 DIAGNOSIS — A419 Sepsis, unspecified organism: Secondary | ICD-10-CM | POA: Diagnosis not present

## 2018-08-04 DIAGNOSIS — A6923 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: Secondary | ICD-10-CM | POA: Diagnosis not present

## 2018-08-04 DIAGNOSIS — K219 Gastro-esophageal reflux disease without esophagitis: Secondary | ICD-10-CM | POA: Diagnosis not present

## 2018-08-04 DIAGNOSIS — R6520 Severe sepsis without septic shock: Secondary | ICD-10-CM | POA: Diagnosis not present

## 2018-08-04 DIAGNOSIS — E1165 Type 2 diabetes mellitus with hyperglycemia: Secondary | ICD-10-CM | POA: Diagnosis not present

## 2018-08-04 DIAGNOSIS — N189 Chronic kidney disease, unspecified: Secondary | ICD-10-CM | POA: Diagnosis not present

## 2018-08-04 DIAGNOSIS — M4697 Unspecified inflammatory spondylopathy, lumbosacral region: Secondary | ICD-10-CM | POA: Diagnosis not present

## 2018-08-04 DIAGNOSIS — E1142 Type 2 diabetes mellitus with diabetic polyneuropathy: Secondary | ICD-10-CM | POA: Diagnosis not present

## 2018-08-04 DIAGNOSIS — I2510 Atherosclerotic heart disease of native coronary artery without angina pectoris: Secondary | ICD-10-CM | POA: Diagnosis not present

## 2018-08-04 DIAGNOSIS — I5042 Chronic combined systolic (congestive) and diastolic (congestive) heart failure: Secondary | ICD-10-CM | POA: Diagnosis not present

## 2018-08-04 DIAGNOSIS — G7000 Myasthenia gravis without (acute) exacerbation: Secondary | ICD-10-CM | POA: Diagnosis not present

## 2018-08-04 DIAGNOSIS — Z955 Presence of coronary angioplasty implant and graft: Secondary | ICD-10-CM

## 2018-08-04 DIAGNOSIS — E785 Hyperlipidemia, unspecified: Secondary | ICD-10-CM | POA: Diagnosis not present

## 2018-08-04 DIAGNOSIS — J4540 Moderate persistent asthma, uncomplicated: Secondary | ICD-10-CM | POA: Diagnosis not present

## 2018-08-04 DIAGNOSIS — I130 Hypertensive heart and chronic kidney disease with heart failure and stage 1 through stage 4 chronic kidney disease, or unspecified chronic kidney disease: Secondary | ICD-10-CM | POA: Diagnosis not present

## 2018-08-04 DIAGNOSIS — Z794 Long term (current) use of insulin: Secondary | ICD-10-CM | POA: Diagnosis not present

## 2018-08-04 NOTE — Telephone Encounter (Signed)
-----   Message from [name], MD sent at 08/04/2018  1:29 PM EDT ----- Regarding: RE: Clearance to return to cardiac rehab Hi Janett Billow, The patient continues to have issues with infections and might need further testing for that.  We should hold off on cardiac rehab for now.  Thank you   ----- Message ----- From: Clotilde Dieter Sent: 07/28/2018  12:42 PM EDT To: [name], MD Subject: Clearance to return to cardiac rehab           Dr. Fletcher Anon,  [name] is enrolled in cardiac rehab. He is about half way through the program.  I know he had a prolonged hospitalization with sepsis and possible new NSTEMI.  He is currently out on medical hold.  He does not have a cardiology follow up until 11/14.  He will need clearance to return to the program and only has until 09/16/18 to complete the program.  When do you think he will be able to return to rehab?  Or should we discharge him at this time to recover and then bring him back with a new referral once he is stronger again?  Please advise! And Thank You!! Alberteen Sam, MA, [place], CCRP 07/28/2018 12:48 PM

## 2018-08-04 NOTE — Progress Notes (Signed)
iCardiac Individual Treatment Plan  Patient Details  Name: [name] MRN: [id number] Date of Birth: 03-Apr-1961 Referring Provider:     Cardiac Rehab from 05/12/2018 in [facility] Cardiac and Pulmonary Rehab  Referring Provider  Arida      Initial Encounter Date:    Cardiac Rehab from 05/12/2018 in [facility] Cardiac and Pulmonary Rehab  Date  05/12/18      Visit Diagnosis: Status post coronary artery stent placement  Patient's Home Medications on Admission:  Current Outpatient Medications:  .  albuterol (PROVENTIL HFA;VENTOLIN HFA) 108 (90 Base) MCG/ACT inhaler, Inhale 2 puffs into the lungs every 6 (six) hours as needed for wheezing or shortness of breath., Disp: , Rfl:  .  albuterol (PROVENTIL) (2.5 MG/3ML) 0.083% nebulizer solution, Take 2.5 mg by nebulization every 6 (six) hours as needed for wheezing or shortness of breath., Disp: , Rfl:  .  amoxicillin-clavulanate (AUGMENTIN) 875-125 MG tablet, Take 1 tablet by mouth every 12 (twelve) hours for 14 days., Disp: 28 tablet, Rfl: 0 .  aspirin EC 81 MG tablet, Take 1 tablet (81 mg total) by mouth daily., Disp: 90 tablet, Rfl: 3 .  BREO ELLIPTA 200-25 MCG/INH AEPB, INHALE 1 PUFF INTO THE LUNGS DAILY (Patient taking differently: Inhale 1 puff into the lungs daily. ), Disp: 1 each, Rfl: 3 .  carvedilol (COREG) 6.25 MG tablet, Take 1 tablet (6.25 mg total) by mouth 2 (two) times daily., Disp: 180 tablet, Rfl: 3 .  clopidogrel (PLAVIX) 75 MG tablet, Take 1 tablet (75 mg total) by mouth daily with breakfast., Disp: 30 tablet, Rfl: 6 .  cyanocobalamin 1000 MCG tablet, Take 1,000 mcg by mouth daily. , Disp: , Rfl:  .  dexlansoprazole (DEXILANT) 60 MG capsule, Take 1 capsule (60 mg total) by mouth daily., Disp: 30 capsule, Rfl: 6 .  doxycycline (VIBRA-TABS) 100 MG tablet, Take 1 tablet (100 mg total) by mouth every 12 (twelve) hours for 14 days., Disp: 28 tablet, Rfl: 0 .  ezetimibe (ZETIA) 10 MG tablet, Take 1 tablet (10 mg total) by mouth daily.,  Disp: 90 tablet, Rfl: 3 .  gabapentin (NEURONTIN) 300 MG capsule, Take 1 capsule (300 mg total) by mouth 3 (three) times daily., Disp: 90 capsule, Rfl: 1 .  insulin lispro (HUMALOG KWIKPEN) 100 UNIT/ML KiwkPen, Inject 7-8 Units into the skin 3 (three) times daily. Reported on 12/02/2015/ sliding scale 1 unit for every 8 units of carbs; and 1 unit for every 20 above 120, Disp: , Rfl:  .  Insulin Pen Needle (FIFTY50 PEN NEEDLES) 32G X 4 MM MISC, Inject 1 each into the skin as needed., Disp: , Rfl:  .  LANTUS SOLOSTAR 100 UNIT/ML Solostar Pen, Inject 25-30 Units into the skin daily at 10 pm. , Disp: , Rfl: 0 .  levocetirizine (XYZAL) 5 MG tablet, Take 1 tablet (5 mg total) by mouth every evening., Disp: 30 tablet, Rfl: 3 .  liraglutide (VICTOZA) 18 MG/3ML SOPN, 1.8 units every morning (Patient taking differently: Inject 1.8 mg into the skin daily. ), Disp: , Rfl:  .  loratadine (CLARITIN) 10 MG tablet, Take 10 mg by mouth daily. , Disp: , Rfl:  .  metFORMIN (GLUCOPHAGE) 1000 MG tablet, Take 1,000 mg by mouth 2 (two) times daily with a meal., Disp: , Rfl:  .  naloxone (NARCAN) nasal spray 4 mg/0.1 mL, For excess sedation from opioids, Disp: 1 kit, Rfl: 2 .  nitroGLYCERIN (NITROSTAT) 0.3 MG SL tablet, Place 1 tablet (0.3 mg total) under the  tongue every 5 (five) minutes as needed for chest pain. Reported on 12/02/2015, Disp: 25 tablet, Rfl: 3 .  rosuvastatin (CRESTOR) 5 MG tablet, Take 5 mg by mouth daily., Disp: , Rfl: 0 .  Saccharomyces boulardii (PROBIOTIC) 250 MG CAPS, Take 1 capsule by mouth 2 (two) times daily for 14 days., Disp: 28 capsule, Rfl: 8 .  sacubitril-valsartan (ENTRESTO) 24-26 MG, Take 1 tablet by mouth 2 (two) times daily., Disp: 60 tablet, Rfl: 6  Past Medical History: Past Medical History:  Diagnosis Date  . Allergy    dust, seasonal (worse in the fall).  . 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 Followed by Pain Specialist in [place], back and neck  . Asthma    BRONCHITIS  . Bacterial  infection, unspecified    patient stated that it is unknown origin  . Cataract    First Dx in 2012  . Chronic combined systolic and diastolic congestive heart failure ([place])    a. 03/2018 Echo: EF 30-35%, ant, antlat, apical AK, Gr1 DD; b. 07/2018 Echo: EF 35-40%, anteroseptal, apical, and ant HK. Gr1 DD.  [name] Coronary artery disease    a. Prior Ant MI->s/p multiple stents placed in the LAD and right coronary artery ([place]); b. 2016 Cath: reportedly nonobs dzs;  c. 02/2017 MV: EF 45-54%, ap/ant, ap/inf, apical infarct, no ishcemia; c. 04/2018 Cath/PCI: LM nl, LAD 20p, patent mid stent, LCX 34m3.25x15 Sierra DES), OM1 nl, OM2 50, OM3 40 w/ patent stent, RCA 40p, 260m40d w/ patent stent in RPDA, RPAV 60, EF 25-35%. 2+MR.  . Diabetes mellitus without complication ([place]   TYPE 2  . Diabetic peripheral neuropathy (HCC)    feet and hands  . FUO (fever of unknown origin) 08/03/2018  . GERD (gastroesophageal reflux disease)   . Headache    muscle tension  . Hyperlipidemia   . Hypertension    CONTROLLED ON MEDS  . Insomnia   . Ischemic cardiomyopathy    a. 03/2018 Echo: EF 30-35%, ant, antlat, apical AK, Gr1 DD, mild MR, mildly dil LA; b. 07/2018 Echo: EF 35-40%.  . Lyme disease    Chronic  . Myasthenia gravis ([facility]  . Myocardial infarction ([facility]  . Seasonal allergies   . Sleep apnea    CPAP    Tobacco Use: Social History   Tobacco Use  Smoking Status Never Smoker  Smokeless Tobacco Never Used  Tobacco Comment   smoking cessation materials not required    Labs: Recent Review Flowsheet Data    Labs for ITP Cardiac and Pulmonary Rehab Latest Ref Rng & Units 11/29/2017 01/17/2018 01/25/2018 05/18/2018 06/01/2018   Cholestrol 0 - 200 141 113 - 109 -   LDLCALC - 77 62 - 50 -   HDL 35 - 70 38(L) 33(L) - 37 -   Trlycerides 40 - 160 130 92 - 112 -   Hemoglobin A1c 4.0 - 5.6 % - - 7.2 7.9(A) 7.9(A)       Exercise Target Goals: Exercise Program Goal: Individual exercise  prescription set using results from initial 6 min walk test and THRR while considering  patient's activity barriers and safety.   Exercise Prescription Goal: Initial exercise prescription builds to 30-45 minutes a day of aerobic activity, 2-3 days per week.  Home exercise guidelines will be given to patient during program as part of exercise prescription that the participant will acknowledge.  Activity Barriers & Risk Stratification: Activity Barriers & Cardiac Risk Stratification - 05/12/18 1213  Activity Barriers & Cardiac Risk Stratification   Activity Barriers  Arthritis;Back Problems;Assistive Device;Neck/Spine Problems;Shortness of Breath;Muscular Weakness   Lower spine arthritis, does Steriod shots at pain clininc:unable at present because on Plavix. has mynesthenia gravis   Cardiac Risk Stratification  High       6 Minute Walk: 6 Minute Walk    Row Name 05/12/18 1353         6 Minute Walk   Distance  1224 feet     Walk Time  6 minutes     # of Rest Breaks  0     MPH  2.32     METS  3.08     RPE  12     Perceived Dyspnea   2     VO2 Peak  10.8     Symptoms  No     Resting HR  97 bpm     Resting BP  126/72     Resting Oxygen Saturation   92 %     Exercise Oxygen Saturation  during 6 min walk  87 %     Max Ex. HR  116 bpm     Max Ex. BP  126/64     2 Minute Post BP  120/64        Oxygen Initial Assessment:   Oxygen Re-Evaluation:   Oxygen Discharge (Final Oxygen Re-Evaluation):   Initial Exercise Prescription: Initial Exercise Prescription - 05/12/18 1300      Date of Initial Exercise RX and Referring Provider   Date  05/12/18    Referring Provider  Arida      Treadmill   MPH  2.3    Grade  1    Minutes  15    METs  3.08      Recumbant Bike   Level  5    RPM  60    Watts  35    Minutes  15    METs  3      T5 Nustep   Level  2    SPM  80    Minutes  15    METs  3      Prescription Details   Frequency (times per week)  3    Duration   Progress to 30 minutes of continuous aerobic without signs/symptoms of physical distress      Intensity   THRR 40-80% of Max Heartrate  123-149    Ratings of Perceived Exertion  11-13    Perceived Dyspnea  0-4      Resistance Training   Training Prescription  Yes    Weight  4 lb    Reps  10-15       Perform Capillary Blood Glucose checks as needed.  Exercise Prescription Changes: Exercise Prescription Changes    Row Name 05/24/18 1200 06/07/18 1600 06/09/18 0900 06/22/18 0900       Response to Exercise   Blood Pressure (Admit)  126/68  102/58  -  110/68    Blood Pressure (Exercise)  154/62  116/54  -  128/54    Blood Pressure (Exit)  124/78  124/58  -  128/64    Heart Rate (Admit)  70 bpm  97 bpm  -  72 bpm    Heart Rate (Exercise)  130 bpm  1118 bpm  -  120 bpm    Heart Rate (Exit)  109 bpm  86 bpm  -  104 bpm    Rating of Perceived Exertion (Exercise)  12  13  -  13    Symptoms  none  none  -  none    Comments  first full week of exercise   -  -  -    Duration  Continue with 30 min of aerobic exercise without signs/symptoms of physical distress.  Continue with 30 min of aerobic exercise without signs/symptoms of physical distress.  -  Continue with 30 min of aerobic exercise without signs/symptoms of physical distress.    Intensity  THRR unchanged  THRR unchanged  -  THRR unchanged      Progression   Progression  Continue to progress workloads to maintain intensity without signs/symptoms of physical distress.  Continue to progress workloads to maintain intensity without signs/symptoms of physical distress.  -  Continue to progress workloads to maintain intensity without signs/symptoms of physical distress.    Average METs  2.4  2.93  -  3.17      Resistance Training   Training Prescription  Yes  Yes  -  Yes    Weight  3 lbs  4 lbs  -  4 lbs    Reps  10-15  10-15  -  10-15      Interval Training   Interval Training  -  No  -  No      Treadmill   MPH  -  2.5  -  2.5     Grade  -  1  -  3    Minutes  -  15  -  15    METs  -  3.26  -  3.95      Recumbant Bike   Level  5  5  -  6    Watts  35  25  -  37    Minutes  15  15  -  15    METs  3  2.72  -  2.9      T5 Nustep   Level  2  2  -  2    Minutes  15  15  -  15    METs  2.4  2.8  -  2.5      Home Exercise Plan   Plans to continue exercise at  -  -  Home (comment) walking, join [employer] (comment) walking, join [employer]  -  -  Add 3 additional days to program exercise sessions.  Add 3 additional days to program exercise sessions.    Initial Home Exercises Provided  -  -  06/09/18  06/09/18       Exercise Comments: Exercise Comments    Row Name 05/19/18 0935           Exercise Comments  First full day of exercise!  Patient was oriented to gym and equipment including functions, settings, policies, and procedures.  Patient's individual exercise prescription and treatment plan were reviewed.  All starting workloads were established based on the results of the 6 minute walk test done at initial orientation visit.  The plan for exercise progression was also introduced and progression will be customized based on patient's performance and goals.          Exercise Goals and Review: Exercise Goals    Row Name 05/12/18 1352             Exercise Goals   Increase Physical Activity  Yes       Intervention  Provide advice, education,  support and counseling about physical activity/exercise needs.;Develop an individualized exercise prescription for aerobic and resistive training based on initial evaluation findings, risk stratification, comorbidities and participant's personal goals.       Expected Outcomes  Short Term: Attend rehab on a regular basis to increase amount of physical activity.;Long Term: Add in home exercise to make exercise part of routine and to increase amount of physical activity.;Long Term: Exercising regularly at least 3-5 days a week.       Increase Strength and Stamina   Yes       Intervention  Provide advice, education, support and counseling about physical activity/exercise needs.;Develop an individualized exercise prescription for aerobic and resistive training based on initial evaluation findings, risk stratification, comorbidities and participant's personal goals.       Expected Outcomes  Short Term: Increase workloads from initial exercise prescription for resistance, speed, and METs.;Short Term: Perform resistance training exercises routinely during rehab and add in resistance training at home;Long Term: Improve cardiorespiratory fitness, muscular endurance and strength as measured by increased METs and functional capacity (6MWT)       Able to understand and use rate of perceived exertion (RPE) scale  Yes       Intervention  Provide education and explanation on how to use RPE scale       Expected Outcomes  Short Term: Able to use RPE daily in rehab to express subjective intensity level;Long Term:  Able to use RPE to guide intensity level when exercising independently       Able to understand and use Dyspnea scale  Yes       Intervention  Provide education and explanation on how to use Dyspnea scale       Expected Outcomes  Short Term: Able to use Dyspnea scale daily in rehab to express subjective sense of shortness of breath during exertion;Long Term: Able to use Dyspnea scale to guide intensity level when exercising independently       Knowledge and understanding of Target Heart Rate Range (THRR)  Yes       Intervention  Provide education and explanation of THRR including how the numbers were predicted and where they are located for reference       Expected Outcomes  Short Term: Able to state/look up THRR;Long Term: Able to use THRR to govern intensity when exercising independently;Short Term: Able to use daily as guideline for intensity in rehab       Able to check pulse independently  Yes       Intervention  Provide education and demonstration on how to check  pulse in carotid and radial arteries.;Review the importance of being able to check your own pulse for safety during independent exercise       Expected Outcomes  Short Term: Able to explain why pulse checking is important during independent exercise;Long Term: Able to check pulse independently and accurately       Understanding of Exercise Prescription  Yes       Intervention  Provide education, explanation, and written materials on patient's individual exercise prescription       Expected Outcomes  Short Term: Able to explain program exercise prescription;Long Term: Able to explain home exercise prescription to exercise independently          Exercise Goals Re-Evaluation : Exercise Goals Re-Evaluation    Row Name 05/19/18 0935 05/24/18 1220 06/07/18 1004 06/09/18 0946 06/22/18 0950     Exercise Goal Re-Evaluation   Exercise Goals Review  Increase Physical Activity;Increase  Strength and Stamina;Able to understand and use rate of perceived exertion (RPE) scale;Understanding of Exercise Prescription  Increase Physical Activity;Increase Strength and Stamina;Able to understand and use rate of perceived exertion (RPE) scale;Understanding of Exercise Prescription  Increase Physical Activity;Increase Strength and Stamina;Understanding of Exercise Prescription  Increase Physical Activity;Increase Strength and Stamina;Understanding of Exercise Prescription;Able to understand and use rate of perceived exertion (RPE) scale;Knowledge and understanding of Target Heart Rate Range (THRR);Able to check pulse independently  Increase Physical Activity;Increase Strength and Stamina;Understanding of Exercise Prescription   Comments  Reviewed RPE scale, THR and program prescription with pt today.  Pt voiced understanding and was given a copy of goals to take home.   [name] is doing well in rehab so far. He is tolerating exercise well. He is on level 5 on the recumbant bike and level 2 on the nu step. We will continue to  monitor progess.   [name] is doing well in rehab.  He is exercising 6 days a week.  He is walking for 35 min at home and using exercise bands as well.  He is getting his strength and stamina back on his good days, but when his myesthina flares up.  His myesthina effects his diaphragm making it hard for him to breathe.   Reviewed home exercise with pt today.  Pt plans to continue walking for exercise.  He and his wife are also going to join the [facility].  Reviewed THR, pulse, RPE, sign and symptoms, NTG use, and when to call 911 or MD.  Also discussed weather considerations and indoor options.  Pt voiced understanding.  [name] continues to do well in rehab.  He is at 37 watts on the recumbent bike.  We will continue to monitor his progression.    Expected Outcomes  Short: Use RPE daily to regulate intensity. Long: Follow program prescription in THR.  Short: increase strength and stamina Long: increase overall MET levels   Short: Continue to exercise on off days.  Long: Continue to increase strength and stamina.   Short: Continue to exercise on off days.  Long: Continue to increase strength and stamina.   Short: Continue to exercise at [facility] on off days.  Long: Continue to exercise indpendently.    Emison Name 06/23/18 1000 07/05/18 1516 07/18/18 1704 08/02/18 1444       Exercise Goal Re-Evaluation   Exercise Goals Review  Increase Physical Activity;Increase Strength and Stamina;Understanding of Exercise Prescription  -  -  -    Comments  [name] has been doing well in rehab.  He is doing well on his home exercise. He is walking usually 3-4 days a week and doing his bands.   He feels the bands are getting easy so we talked about doubling up his band and increasing his sets.    Out since last review  Out since last review  Out since last review    Expected Outcomes  Short: Double up band for more resistance.  Long: Continue to exercise on his own.   -  -  -       Discharge Exercise Prescription (Final Exercise Prescription  Changes): Exercise Prescription Changes - 06/22/18 0900      Response to Exercise   Blood Pressure (Admit)  110/68    Blood Pressure (Exercise)  128/54    Blood Pressure (Exit)  128/64    Heart Rate (Admit)  72 bpm    Heart Rate (Exercise)  120 bpm    Heart Rate (Exit)  104  bpm    Rating of Perceived Exertion (Exercise)  13    Symptoms  none    Duration  Continue with 30 min of aerobic exercise without signs/symptoms of physical distress.    Intensity  THRR unchanged      Progression   Progression  Continue to progress workloads to maintain intensity without signs/symptoms of physical distress.    Average METs  3.17      Resistance Training   Training Prescription  Yes    Weight  4 lbs    Reps  10-15      Interval Training   Interval Training  No      Treadmill   MPH  2.5    Grade  3    Minutes  15    METs  3.95      Recumbant Bike   Level  6    Watts  37    Minutes  15    METs  2.9      T5 Nustep   Level  2    Minutes  15    METs  2.5      Home Exercise Plan   Plans to continue exercise at  Home (comment)   walking, join [employer]   Frequency  Add 3 additional days to program exercise sessions.    Initial Home Exercises Provided  06/09/18       Nutrition:  Target Goals: Understanding of nutrition guidelines, daily intake of sodium '1500mg'$ , cholesterol '200mg'$ , calories 30% from fat and 7% or less from saturated fats, daily to have 5 or more servings of fruits and vegetables.  Biometrics: Pre Biometrics - 05/12/18 1351      Pre Biometrics   Height  '5\' 8"'$  (1.727 m)    Weight  232 lb 3.2 oz (105.3 kg)    Waist Circumference  47 inches    Hip Circumference  45 inches    Waist to Hip Ratio  1.04 %    BMI (Calculated)  35.31    Single Leg Stand  1 seconds   neuropathy       Nutrition Therapy Plan and Nutrition Goals: Nutrition Therapy & Goals - 05/31/18 1029      Nutrition Therapy   Diet  DM    Protein (specify units)  12-13    Fiber  35 grams     Whole Grain Foods  3 servings   chooses whole grains regularly   Saturated Fats  15 max. grams    Fruits and Vegetables  5 servings/day   8 ideal; trying to eat more vegetables, does not always eat 3 meals per day   Sodium  1500 grams      Personal Nutrition Goals   Nutrition Goal  Consume a protein source with meals and snacks, at least 1oz/7g, along with at least 1 serving (15g) of carbohydrate to help better regulate blood sugars    Personal Goal #2  Include more sources of non-starchy vegetables daily. This also helps to increase daily fiber intake to aid in short term BG control    Personal Goal #3  Eat a mid day meal/ snack more consistently. Try not to go more than 4-5 hours without eating. You may also want to try a snack '@HS'$  to prevent low blood sugar readings upon waking in the morning    Comments  He has had diabetic education in the past and practices carb counting, but does not eat on a regular schedule most days and  does not always practice eating balanced meals. He is limited to 64oz of fluid per day d/t CHF and c/o struggling to eat enough vegetables d/t disliking several varieties. He plans to try oatmeal for breakfast this week as a new heart-healthy option, as he tends to want to choose high-sugar cereals like [employer] Puffs      Intervention Plan   Intervention  Prescribe, educate and counsel regarding individualized specific dietary modifications aiming towards targeted core components such as weight, hypertension, lipid management, diabetes, heart failure and other comorbidities.    Expected Outcomes  Short Term Goal: Understand basic principles of dietary content, such as calories, fat, sodium, cholesterol and nutrients.;Short Term Goal: A plan has been developed with personal nutrition goals set during dietitian appointment.;Long Term Goal: Adherence to prescribed nutrition plan.       Nutrition Assessments: Nutrition Assessments - 05/12/18 1221      MEDFICTS Scores   Pre  Score  55       Nutrition Goals Re-Evaluation: Nutrition Goals Re-Evaluation    Row Name 05/31/18 1039 06/07/18 1009 06/23/18 1200         Goals   Nutrition Goal  Eat a mid day meal/ snack more consistently. Try not to go more than 4-5 hours without eating. You may also want to try a snack '@HS'$  to prevent low blood sugar readings upon waking in the morning  Mid day meal/snack, non starch vegetables, more fiber, more protein  Eat a mid-day meal or snack more consistently to help better manage blood glucose levels, include more sources of non-starchy vegetables, and consume a protein source with meals and snacks     Comment  He is not consistent about eating a meal mid day. He typically eats breakfast and dinner and does not usually eat an evening (HS) snack  [name] is eating that mid day meal/snack more consistently and has found that it is keeping his blood sugars more even throughout the day.   He has also branched out to non-starchy vegetables including cauliflower. [name] has added in more protein and using protein shakes for his extra meal.    He has been eating a snack mid-morning which he states has made a noticeable positive change in his BG levels. He has incorporated oatmeal as a breakfast option and has been experimenting with different non-starchy vegetables (green beans and carrots last week, brocoli and cauliflower this week)     Expected Outcome  He will be consistent about eating a small meal/ snack mid day to help improve blood glucose control over the course of the day. This meal will include at least 1 serving of protein + at least 1 serving of complex carbohydrate  Short: Stay consistent with three meals a day.  Long: Continue to add in the newer fooods.   He will continue to implement a snack between breakfast and lunch to help improve BG control over time. He will also continue to include a variety of different vegetables in his diet, along with other fiber sources like oatmeal        Personal Goal #2 Re-Evaluation   Personal Goal #2  Include more sources of non-starchy vegetables daily. This also helps to increase daily fiber intake to aid in short-term BG control  -  -       Personal Goal #3 Re-Evaluation   Personal Goal #3  Consume a protein source with meals and snacks, at least 1oz/7g, along with at least 1 serving (15g) of carbohydrate to  help better regulate blood sugars  -  -        Nutrition Goals Discharge (Final Nutrition Goals Re-Evaluation): Nutrition Goals Re-Evaluation - 06/23/18 1200      Goals   Nutrition Goal  Eat a mid-day meal or snack more consistently to help better manage blood glucose levels, include more sources of non-starchy vegetables, and consume a protein source with meals and snacks    Comment  He has been eating a snack mid-morning which he states has made a noticeable positive change in his BG levels. He has incorporated oatmeal as a breakfast option and has been experimenting with different non-starchy vegetables (green beans and carrots last week, brocoli and cauliflower this week)    Expected Outcome  He will continue to implement a snack between breakfast and lunch to help improve BG control over time. He will also continue to include a variety of different vegetables in his diet, along with other fiber sources like oatmeal       Psychosocial: Target Goals: Acknowledge presence or absence of significant depression and/or stress, maximize coping skills, provide positive support system. Participant is able to verbalize types and ability to use techniques and skills needed for reducing stress and depression.   Initial Review & Psychosocial Screening: Initial Psych Review & Screening - 05/12/18 1216      Initial Review   Current issues with  Current Stress Concerns    Source of Stress Concerns  Chronic Illness;Unable to perform yard/household activities;Family    Comments  Household has changed: children moved back home. Good and bad  stress with his. Son is helpful; new chaos in house.       Family Dynamics   Good Support System?  Yes   Wife, son,grandson, family friends     Barriers   Psychosocial barriers to participate in program  There are no identifiable barriers or psychosocial needs.;The patient should benefit from training in stress management and relaxation.      Screening Interventions   Interventions  Encouraged to exercise;To provide support and resources with identified psychosocial needs;Provide feedback about the scores to participant    Expected Outcomes  Short Term goal: Utilizing psychosocial counselor, staff and physician to assist with identification of specific Stressors or current issues interfering with healing process. Setting desired goal for each stressor or current issue identified.;Long Term Goal: Stressors or current issues are controlled or eliminated.;Short Term goal: Identification and review with participant of any Quality of Life or Depression concerns found by scoring the questionnaire.;Long Term goal: The participant improves quality of Life and PHQ9 Scores as seen by post scores and/or verbalization of changes       Quality of Life Scores:  Quality of Life - 05/12/18 1217      Quality of Life   Select  Quality of Life      Quality of Life Scores   Health/Function Pre  18.53 %    Socioeconomic Pre  26.5 %    Psych/Spiritual Pre  28.58 %    Family Pre  24.4 %    GLOBAL Pre  22.83 %      Scores of 19 and below usually indicate a poorer quality of life in these areas.  A difference of  2-3 points is a clinically meaningful difference.  A difference of 2-3 points in the total score of the Quality of Life Index has been associated with significant improvement in overall quality of life, self-image, physical symptoms, and general health in studies assessing  change in quality of life.  PHQ-9: Recent Review Flowsheet Data    Depression screen [facility] 2/9 08/03/2018 07/26/2018 07/07/2018  06/21/2018 06/10/2018   Decreased Interest 0 0 0 0 0   Down, Depressed, Hopeless 1 1 0 1 0   PHQ - 2 Score 1 1 0 1 0   Altered sleeping - 2 - - 0   Tired, decreased energy - 3 - - 0   Change in appetite - 0 - - 0   Feeling bad or failure about yourself  - 0 - - 0   Trouble concentrating - 1 - - 0   Moving slowly or fidgety/restless - 0 - - 0   Suicidal thoughts - 0 - - 0   PHQ-9 Score - 7 - - 0   Difficult doing work/chores - Somewhat difficult - - Not difficult at all     Interpretation of Total Score  Total Score Depression Severity:  1-4 = Minimal depression, 5-9 = Mild depression, 10-14 = Moderate depression, 15-19 = Moderately severe depression, 20-27 = Severe depression   Psychosocial Evaluation and Intervention: Psychosocial Evaluation - 06/16/18 1047      Psychosocial Evaluation & Interventions   Interventions  Therapist referral;Stress management education;Encouraged to exercise with the program and follow exercise prescription    Comments  Counselor spoke with Mr. Quam ([name]) by phone on 9/4 for initial psychosocial evaluation - per his request.  He is a 57 year old who has had multiple stents and diagnosed with CHF.  He has a strong support system with a spouse of 56 years; a son and his girlfriend who live in the home; some good neighbors and family who live out of state.  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  He sleeps "okay" and has a decent appetite.  He reports not history of depression but is experiencing some current symptoms of depression and anxiety and would like to see someone.  His primary stressors are his health; his spouse's health and some negative family conflict and dynamics currently.  His goals for this program are to exercise routinely and feel more energetic.  counselor provided him with a therapist's contact infor locally for him to consider.   Staff will follow.    Expected Outcomes  Short:  [name] will contact therapist for emotional support currently with all his stress.  He will exercise for his health and mental health.  Long:  [name] will continue to practice positive self-care strategies for his health and mental health to manage stress.    Continue Psychosocial Services   Follow up required by staff       Psychosocial Re-Evaluation: Psychosocial Re-Evaluation    [place] Name 06/07/18 1008 06/23/18 1002           Psychosocial Re-Evaluation   Current issues with  Current Stress Concerns  Current Stress Concerns;Current Sleep Concerns      Comments  [name]'s biggest problem is his stress levels at home and he is scheduled to meet with Juliann Pulse to discuss.   [name] has set up an appointment to meet couselor starting next Monday.  Having a plan has him feeling better overall.  His wife is a Education officer, museum and now they are able to talk more.  His is getting about 6 1/2 hours a sleep a night, not bad but not great either.  He is looking forward  to meeting with the counselor.  He was in a perfect storm with everything but has found coming to class to be very helpful.  He is enjoying being pushed for exercise and other changes.       Expected Outcomes  Short: Meet with conselor.  Long: Continue to work on recommendations.   Short: Go to conselor appointment.  Long: Continue to work on new Radiographer, therapeutic.       Interventions  Stress management education;Encouraged to attend Cardiac Rehabilitation for the exercise  Stress management education;Encouraged to attend Cardiac Rehabilitation for the exercise      Continue Psychosocial Services   Follow up required by counselor  Follow up required by staff      Comments  -  Household has changed: children moved back home. Good and bad stress with his. Son is helpful; new chaos in house.         Initial Review   Source of Stress Concerns  -  Chronic Illness;Unable to perform yard/household activities;Family          Psychosocial Discharge (Final Psychosocial Re-Evaluation): Psychosocial Re-Evaluation - 06/23/18 1002      Psychosocial Re-Evaluation   Current issues with  Current Stress Concerns;Current Sleep Concerns    Comments  [name] has set up an appointment to meet couselor starting next Monday.  Having a plan has him feeling better overall.  His wife is a Education officer, museum and now they are able to talk more.  His is getting about 6 1/2 hours a sleep a night, not bad but not great either.  He is looking forward to meeting with the counselor.  He was in a perfect storm with everything but has found coming to class to be very helpful.  He is enjoying being pushed for exercise and other changes.     Expected Outcomes  Short: Go to conselor appointment.  Long: Continue to work on new Radiographer, therapeutic.     Interventions  Stress management education;Encouraged to attend Cardiac Rehabilitation for the exercise    Continue Psychosocial Services   Follow up required by staff    Comments  Household has changed: children moved back home. Good and bad stress with his. Son is helpful; new chaos in house.       Initial Review   Source of Stress Concerns  Chronic Illness;Unable to perform yard/household activities;Family       Vocational Rehabilitation: Provide vocational rehab assistance to qualifying candidates.   Vocational Rehab Evaluation & Intervention: Vocational Rehab - 05/12/18 1222      Initial Vocational Rehab Evaluation & Intervention   Assessment shows need for Vocational Rehabilitation  No       Education: Education Goals: Education classes will be provided on a variety of topics geared toward better understanding of heart health and risk factor modification. Participant will state understanding/return demonstration of topics presented as noted by education test scores.  Learning Barriers/Preferences: Learning Barriers/Preferences - 05/12/18 1221      Learning Barriers/Preferences   Learning  Barriers  None    Learning Preferences  None       Education Topics:  AED/CPR: - Group verbal and written instruction with the use of models to demonstrate the basic use of the AED with the basic ABC's of resuscitation.   Cardiac Rehab from 06/23/2018 in [facility] Cardiac and Pulmonary Rehab  Date  06/21/18  Educator  KS  Instruction Review Code  1- Verbalizes Understanding      General Nutrition Guidelines/Fats and  Fiber: -Group instruction provided by verbal, written material, models and posters to present the general guidelines for heart healthy nutrition. Gives an explanation and review of dietary fats and fiber.   Controlling Sodium/Reading Food Labels: -Group verbal and written material supporting the discussion of sodium use in heart healthy nutrition. Review and explanation with models, verbal and written materials for utilization of the food label.   Cardiac Rehab from 06/23/2018 in [facility] Cardiac and Pulmonary Rehab  Date  06/23/18  Educator  LB  Instruction Review Code  1- Verbalizes Understanding      Exercise Physiology & General Exercise Guidelines: - Group verbal and written instruction with models to review the exercise physiology of the cardiovascular system and associated critical values. Provides general exercise guidelines with specific guidelines to those with heart or lung disease.    Aerobic Exercise & Resistance Training: - Gives group verbal and written instruction on the various components of exercise. Focuses on aerobic and resistive training programs and the benefits of this training and how to safely progress through these programs..   Flexibility, Balance, Mind/Body Relaxation: Provides group verbal/written instruction on the benefits of flexibility and balance training, including mind/body exercise modes such as yoga, pilates and tai chi.  Demonstration and skill practice provided.   Stress and Anxiety: - Provides group verbal and written instruction  about the health risks of elevated stress and causes of high stress.  Discuss the correlation between heart/lung disease and anxiety and treatment options. Review healthy ways to manage with stress and anxiety.   Cardiac Rehab from 06/23/2018 in [facility] Cardiac and Pulmonary Rehab  Date  05/24/18  Educator  [facility]  Instruction Review Code  5- Refused Teaching [patient had yesterday]      Depression: - Provides group verbal and written instruction on the correlation between heart/lung disease and depressed mood, treatment options, and the stigmas associated with seeking treatment.   Anatomy & Physiology of the Heart: - Group verbal and written instruction and models provide basic cardiac anatomy and physiology, with the coronary electrical and arterial systems. Review of Valvular disease and Heart Failure   Cardiac Procedures: - Group verbal and written instruction to review commonly prescribed medications for heart disease. Reviews the medication, class of the drug, and side effects. Includes the steps to properly store meds and maintain the prescription regimen. (beta blockers and nitrates)   Cardiac Rehab from 06/23/2018 in [facility] Cardiac and Pulmonary Rehab  Date  06/16/18  Educator  CE  Instruction Review Code  1- Verbalizes Understanding      Cardiac Medications I: - Group verbal and written instruction to review commonly prescribed medications for heart disease. Reviews the medication, class of the drug, and side effects. Includes the steps to properly store meds and maintain the prescription regimen.   Cardiac Rehab from 06/23/2018 in [facility] Cardiac and Pulmonary Rehab  Date  05/31/18  Educator  SB  Instruction Review Code  1- Verbalizes Understanding      Cardiac Medications II: -Group verbal and written instruction to review commonly prescribed medications for heart disease. Reviews the medication, class of the drug, and side effects. (all other drug classes)   Cardiac Rehab from  06/23/2018 in [facility] Cardiac and Pulmonary Rehab  Date  05/19/18  Educator  CE  Instruction Review Code  1- Verbalizes Understanding       Go Sex-Intimacy & Heart Disease, Get SMART - Goal Setting: - Group verbal and written instruction through game format to discuss heart disease and the  return to sexual intimacy. Provides group verbal and written material to discuss and apply goal setting through the application of the S.M.A.R.T. Method.   Cardiac Rehab from 06/23/2018 in [facility] Cardiac and Pulmonary Rehab  Date  06/16/18  Educator  CE  Instruction Review Code  1- Verbalizes Understanding      Other Matters of the Heart: - Provides group verbal, written materials and models to describe Stable Angina and Peripheral Artery. Includes description of the disease process and treatment options available to the cardiac patient.   Exercise & Equipment Safety: - Individual verbal instruction and demonstration of equipment use and safety with use of the equipment.   Cardiac Rehab from 06/23/2018 in [facility] Cardiac and Pulmonary Rehab  Date  05/12/18  Educator  SB  Instruction Review Code  1- Verbalizes Understanding      Infection Prevention: - Provides verbal and written material to individual with discussion of infection control including proper hand washing and proper equipment cleaning during exercise session.   Cardiac Rehab from 06/23/2018 in [facility] Cardiac and Pulmonary Rehab  Date  05/12/18  Educator  Sb  Instruction Review Code  1- Verbalizes Understanding      Falls Prevention: - Provides verbal and written material to individual with discussion of falls prevention and safety.   Cardiac Rehab from 06/23/2018 in [facility] Cardiac and Pulmonary Rehab  Date  05/12/18  Educator  SB  Instruction Review Code  1- Verbalizes Understanding      Diabetes: - Individual verbal and written instruction to review signs/symptoms of diabetes, desired ranges of glucose level fasting, after meals and  with exercise. Acknowledge that pre and post exercise glucose checks will be done for 3 sessions at entry of program.   Cardiac Rehab from 06/23/2018 in [facility] Cardiac and Pulmonary Rehab  Date  05/12/18  Educator  SB  Instruction Review Code  1- Verbalizes Understanding      Know Your Numbers and Risk Factors: -Group verbal and written instruction about important numbers in your health.  Discussion of what are risk factors and how they play a role in the disease process.  Review of Cholesterol, Blood Pressure, Diabetes, and BMI and the role they play in your overall health.   Cardiac Rehab from 06/23/2018 in [facility] Cardiac and Pulmonary Rehab  Date  05/19/18  Educator  CE  Instruction Review Code  1- Verbalizes Understanding      Sleep Hygiene: -Provides group verbal and written instruction about how sleep can affect your health.  Define sleep hygiene, discuss sleep cycles and impact of sleep habits. Review good sleep hygiene tips.    Cardiac Rehab from 06/23/2018 in [facility] Cardiac and Pulmonary Rehab  Date  06/07/18  Educator  [facility]  Instruction Review Code  1- Verbalizes Understanding      Other: -Provides group and verbal instruction on various topics (see comments)   Knowledge Questionnaire Score: Knowledge Questionnaire Score - 05/12/18 1221      Knowledge Questionnaire Score   Pre Score  25/26   correct response reviewed with [name]. He vebalized understanding. No further questions today      Core Components/Risk Factors/Patient Goals at Admission: Personal Goals and Risk Factors at Admission - 05/12/18 1222      Core Components/Risk Factors/Patient Goals on Admission    Weight Management  Obesity;Weight Loss;Yes    Intervention  Weight Management: Develop a combined nutrition and exercise program designed to reach desired caloric intake, while maintaining appropriate intake of nutrient and fiber, sodium and  fats, and appropriate energy expenditure required for the weight goal.     Admit Weight  232 lb 3.2 oz (105.3 kg)    Goal Weight: Short Term  230 lb (104.3 kg)    Goal Weight: Long Term  190 lb (86.2 kg)    Expected Outcomes  Short Term: Continue to assess and modify interventions until short term weight is achieved;Long Term: Adherence to nutrition and physical activity/exercise program aimed toward attainment of established weight goal;Weight Loss: Understanding of general recommendations for a balanced deficit meal plan, which promotes 1-2 lb weight loss per week and includes a negative energy balance of [id number] kcal/d    Diabetes  Yes    Intervention  Provide education about signs/symptoms and action to take for hypo/hyperglycemia.;Provide education about proper nutrition, including hydration, and aerobic/resistive exercise prescription along with prescribed medications to achieve blood glucose in normal ranges: Fasting glucose 65-99 mg/dL    Expected Outcomes  Short Term: Participant verbalizes understanding of the signs/symptoms and immediate care of hyper/hypoglycemia, proper foot care and importance of medication, aerobic/resistive exercise and nutrition plan for blood glucose control.;Long Term: Attainment of HbA1C < 7%.    Heart Failure  Yes    Intervention  Provide a combined exercise and nutrition program that is supplemented with education, support and counseling about heart failure. Directed toward relieving symptoms such as shortness of breath, decreased exercise tolerance, and extremity edema.    Expected Outcomes  Improve functional capacity of life;Short term: Attendance in program 2-3 days a week with increased exercise capacity. Reported lower sodium intake. Reported increased fruit and vegetable intake. Reports medication compliance.;Short term: Daily weights obtained and reported for increase. Utilizing diuretic protocols set by physician.;Long term: Adoption of self-care skills and reduction of barriers for early signs and symptoms recognition and  intervention leading to self-care maintenance.    Hypertension  Yes    Intervention  Provide education on lifestyle modifcations including regular physical activity/exercise, weight management, moderate sodium restriction and increased consumption of fresh fruit, vegetables, and low fat dairy, alcohol moderation, and smoking cessation.;Monitor prescription use compliance.    Expected Outcomes  Short Term: Continued assessment and intervention until BP is < 140/11m HG in hypertensive participants. < 130/83mHG in hypertensive participants with diabetes, heart failure or chronic kidney disease.;Long Term: Maintenance of blood pressure at goal levels.    Lipids  Yes    Intervention  Provide education and support for participant on nutrition & aerobic/resistive exercise along with prescribed medications to achieve LDL '70mg'$ , HDL >'40mg'$ .    Expected Outcomes  Short Term: Participant states understanding of desired cholesterol values and is compliant with medications prescribed. Participant is following exercise prescription and nutrition guidelines.;Long Term: Cholesterol controlled with medications as prescribed, with individualized exercise RX and with personalized nutrition plan. Value goals: LDL < '70mg'$ , HDL > 40 mg.       Core Components/Risk Factors/Patient Goals Review:  Goals and Risk Factor Review    Row Name 06/07/18 1013 06/23/18 0956           Core Components/Risk Factors/Patient Goals Review   Personal Goals Review  Weight Management/Obesity;Diabetes;Hypertension;Heart Failure;Lipids  Weight Management/Obesity;Diabetes;Hypertension;Heart Failure;Lipids      Review  TiOctavia Bruckneras been working on his blood sugars with a better diet and chaning his timing of his insulin.  He is now taking his insulin in the morning and after exercise on days he comes to rehab.   Overall, his weight is down three pounds since he started rehab.  He has not had any heart failure symptoms, but does have some  occasionaly chest pain which is similar to his pattern post stent previously.  Blood pressure have been between 110-120/60-70 and check those three times a day at home.  He is doing well on his medications and since the lasix was adjusted he is feeling better overall.   [name] has been changing his medications to get better regulated.  He has had his Lasix d/c'd  and thus his weight has been going up.  His family is very supportive of his changes and they are all working together on his diet.  His blood surgars have been around 125 in the morning and the highest has been 150.  He has had more issues with his sugars dropping and has already talked to his endocrinologist about it.  [name]'s blood pressures are staying in the 120s for the most part and he continues to check them daily at home.  He has been having some swelling in his hands since stopping the Lasix and plans to call Dr. Fletcher Anon when he gets home today.       Expected Outcomes  Short: Continue to work on weight loss and diabetes.  Long: Continue to monitor risk factors.   Short: Continue to work on weight loss.  Long: Continue to monitor heart failure and managing symptoms.          Core Components/Risk Factors/Patient Goals at Discharge (Final Review):  Goals and Risk Factor Review - 06/23/18 0956      Core Components/Risk Factors/Patient Goals Review   Personal Goals Review  Weight Management/Obesity;Diabetes;Hypertension;Heart Failure;Lipids    Review  [name] has been changing his medications to get better regulated.  He has had his Lasix d/c'd  and thus his weight has been going up.  His family is very supportive of his changes and they are all working together on his diet.  His blood surgars have been around 125 in the morning and the highest has been 150.  He has had more issues with his sugars dropping and has already talked to his endocrinologist about it.  [name]'s blood pressures are staying in the 120s for the most part and he continues to check  them daily at home.  He has been having some swelling in his hands since stopping the Lasix and plans to call Dr. Fletcher Anon when he gets home today.     Expected Outcomes  Short: Continue to work on weight loss.  Long: Continue to monitor heart failure and managing symptoms.        ITP Comments: ITP Comments    Row Name 05/12/18 1225 05/18/18 0849 06/15/18 0607 07/05/18 1516 07/12/18 1520   ITP Comments  Medical review completed today. INitial ITP sent to Dr Caryn Section to review, change as needed and to sign. Diagnosis documentation can be found in [facility] 04/25/2018 Encounter  30 day review completed. ITP sent to Dr. Ramonita Lab, covering for Dr. Emily Filbert, Medical Director of Cardiac Rehab. Continue with ITP unless changes are made by physician. New to program. Has yet to start exercise.   30 day review completed. ITP sent to Dr. Emily Filbert, Medical Director of Cardiac Rehab. Continue with ITP unless changes are made by physician  Called to check on pt.  Last attended on 9/19.  Left message on mobile voicemail.  [name] called to let us know that he was in the hospital for sepsis, hypoxia, and new NSTEMI.  He would like to return  to class once cleared and told him that he will need a note to return.     [place] Name 07/13/18 0631 07/18/18 1702 08/02/18 1442 08/04/18 1505     ITP Comments  30 day review.  Continue with ITP unless directed changes per Medical Director review.  [name] has a follow up appt scheduled for 10/17 after recent readmission for NSTEMI, altered mental status, sepsis.  He will need clearance to return to rehab.   [name] continues to be out on [employer].  He is curently scheduled for cardiology follow up on 11/14.  Note for clearance has also been sent to Dr. Fletcher Anon.    Called and spoke with [name].  We will discharge him from the program at this time.  Once he is able, we will get a new referral for him to return to the program.   Discharge ITP and [place] created and sent for review.        Comments:  Discharge ITP

## 2018-08-04 NOTE — Telephone Encounter (Signed)
Called and spoke with [name].  We will discharge him from the program at this time.  Once he is able, we will get a new referral for him to return to the program.

## 2018-08-04 NOTE — Progress Notes (Signed)
Discharge Progress Report  Patient Details  Name: [name] MRN: [id number] Date of Birth: 03-Feb-1961 Referring Provider:     Cardiac Rehab from 05/12/2018 in [facility]  Referring Provider  Arida       Number of Visits: 16  Reason for Discharge:  Early Exit:  Readmission  Smoking History:  Social History   Tobacco Use  Smoking Status Never Smoker  Smokeless Tobacco Never Used  Tobacco Comment   smoking cessation materials not required    Diagnosis:  Status post coronary artery stent placement  ADL UCSD:   Initial Exercise Prescription: Initial Exercise Prescription - 05/12/18 1300      Date of Initial Exercise RX and Referring Provider   Date  05/12/18    Referring Provider  Arida      Treadmill   MPH  2.3    Grade  1    Minutes  15    METs  3.08      Recumbant Bike   Level  5    RPM  60    Watts  35    Minutes  15    METs  3      T5 Nustep   Level  2    SPM  80    Minutes  15    METs  3      Prescription Details   Frequency (times per week)  3    Duration  Progress to 30 minutes of continuous aerobic without signs/symptoms of physical distress      Intensity   THRR 40-80% of Max Heartrate  123-149    Ratings of Perceived Exertion  11-13    Perceived Dyspnea  0-4      Resistance Training   Training Prescription  Yes    Weight  4 lb    Reps  10-15       Discharge Exercise Prescription (Final Exercise Prescription Changes): Exercise Prescription Changes - 06/22/18 0900      Response to Exercise   Blood Pressure (Admit)  110/68    Blood Pressure (Exercise)  128/54    Blood Pressure (Exit)  128/64    Heart Rate (Admit)  72 bpm    Heart Rate (Exercise)  120 bpm    Heart Rate (Exit)  104 bpm    Rating of Perceived Exertion (Exercise)  13    Symptoms  none    Duration  Continue with 30 min of aerobic exercise without signs/symptoms of physical distress.    Intensity  THRR unchanged      Progression    Progression  Continue to progress workloads to maintain intensity without signs/symptoms of physical distress.    Average METs  3.17      Resistance Training   Training Prescription  Yes    Weight  4 lbs    Reps  10-15      Interval Training   Interval Training  No      Treadmill   MPH  2.5    Grade  3    Minutes  15    METs  3.95      Recumbant Bike   Level  6    Watts  37    Minutes  15    METs  2.9      T5 Nustep   Level  2    Minutes  15    METs  2.5      Home Exercise Plan  Plans to continue exercise at  Home (comment)   walking, join [employer]   Frequency  Add 3 additional days to program exercise sessions.    Initial Home Exercises Provided  06/09/18       Functional Capacity: 6 Minute Walk    Row Name 05/12/18 1353         6 Minute Walk   Distance  1224 feet     Walk Time  6 minutes     # of Rest Breaks  0     MPH  2.32     METS  3.08     RPE  12     Perceived Dyspnea   2     VO2 Peak  10.8     Symptoms  No     Resting HR  97 bpm     Resting BP  126/72     Resting Oxygen Saturation   92 %     Exercise Oxygen Saturation  during 6 min walk  87 %     Max Ex. HR  116 bpm     Max Ex. BP  126/64     2 Minute Post BP  120/64        Psychological, QOL, Others - Outcomes: PHQ 2/9: Depression screen [facility] 2/9 08/03/2018 07/26/2018 07/07/2018 06/21/2018 06/10/2018  Decreased Interest 0 0 0 0 0  Down, Depressed, Hopeless 1 1 0 1 0  PHQ - 2 Score 1 1 0 1 0  Altered sleeping - 2 - - 0  Tired, decreased energy - 3 - - 0  Change in appetite - 0 - - 0  Feeling bad or failure about yourself  - 0 - - 0  Trouble concentrating - 1 - - 0  Moving slowly or fidgety/restless - 0 - - 0  Suicidal thoughts - 0 - - 0  PHQ-9 Score - 7 - - 0  Difficult doing work/chores - Somewhat difficult - - Not difficult at all  Some recent data might be hidden    Quality of Life: Quality of Life - 05/12/18 1217      Quality of Life   Select  Quality of Life      Quality of  Life Scores   Health/Function Pre  18.53 %    Socioeconomic Pre  26.5 %    Psych/Spiritual Pre  28.58 %    Family Pre  24.4 %    GLOBAL Pre  22.83 %       Personal Goals: Goals established at orientation with interventions provided to work toward goal. Personal Goals and Risk Factors at Admission - 05/12/18 1222      Core Components/Risk Factors/Patient Goals on Admission    Weight Management  Obesity;Weight Loss;Yes    Intervention  Weight Management: Develop a combined nutrition and exercise program designed to reach desired caloric intake, while maintaining appropriate intake of nutrient and fiber, sodium and fats, and appropriate energy expenditure required for the weight goal.    Admit Weight  232 lb 3.2 oz (105.3 kg)    Goal Weight: Short Term  230 lb (104.3 kg)    Goal Weight: Long Term  190 lb (86.2 kg)    Expected Outcomes  Short Term: Continue to assess and modify interventions until short term weight is achieved;Long Term: Adherence to nutrition and physical activity/exercise program aimed toward attainment of established weight goal;Weight Loss: Understanding of general recommendations for a balanced deficit meal plan, which promotes 1-2 lb weight loss  per week and includes a negative energy balance of [phone] kcal/d    Diabetes  Yes    Intervention  Provide education about signs/symptoms and action to take for hypo/hyperglycemia.;Provide education about proper nutrition, including hydration, and aerobic/resistive exercise prescription along with prescribed medications to achieve blood glucose in normal ranges: Fasting glucose 65-99 mg/dL    Expected Outcomes  Short Term: Participant verbalizes understanding of the signs/symptoms and immediate care of hyper/hypoglycemia, proper foot care and importance of medication, aerobic/resistive exercise and nutrition plan for blood glucose control.;Long Term: Attainment of HbA1C < 7%.    Heart Failure  Yes    Intervention  Provide a  combined exercise and nutrition program that is supplemented with education, support and counseling about heart failure. Directed toward relieving symptoms such as shortness of breath, decreased exercise tolerance, and extremity edema.    Expected Outcomes  Improve functional capacity of life;Short term: Attendance in program 2-3 days a week with increased exercise capacity. Reported lower sodium intake. Reported increased fruit and vegetable intake. Reports medication compliance.;Short term: Daily weights obtained and reported for increase. Utilizing diuretic protocols set by physician.;Long term: Adoption of self-care skills and reduction of barriers for early signs and symptoms recognition and intervention leading to self-care maintenance.    Hypertension  Yes    Intervention  Provide education on lifestyle modifcations including regular physical activity/exercise, weight management, moderate sodium restriction and increased consumption of fresh fruit, vegetables, and low fat dairy, alcohol moderation, and smoking cessation.;Monitor prescription use compliance.    Expected Outcomes  Short Term: Continued assessment and intervention until BP is < 140/36mm HG in hypertensive participants. < 130/65mm HG in hypertensive participants with diabetes, heart failure or chronic kidney disease.;Long Term: Maintenance of blood pressure at goal levels.    Lipids  Yes    Intervention  Provide education and support for participant on nutrition & aerobic/resistive exercise along with prescribed medications to achieve LDL 70mg , HDL >40mg .    Expected Outcomes  Short Term: Participant states understanding of desired cholesterol values and is compliant with medications prescribed. Participant is following exercise prescription and nutrition guidelines.;Long Term: Cholesterol controlled with medications as prescribed, with individualized exercise RX and with personalized nutrition plan. Value goals: LDL < 70mg , HDL > 40 mg.         Personal Goals Discharge: Goals and Risk Factor Review    Row Name 06/07/18 1013 06/23/18 0956           Core Components/Risk Factors/Patient Goals Review   Personal Goals Review  Weight Management/Obesity;Diabetes;Hypertension;Heart Failure;Lipids  Weight Management/Obesity;Diabetes;Hypertension;Heart Failure;Lipids      Review  [name] has been working on his blood sugars with a better diet and chaning his timing of his insulin.  He is now taking his insulin in the morning and after exercise on days he comes to rehab.   Overall, his weight is down three pounds since he started rehab.   He has not had any heart failure symptoms, but does have some occasionaly chest pain which is similar to his pattern post stent previously.  Blood pressure have been between 110-120/60-70 and check those three times a day at home.  He is doing well on his medications and since the lasix was adjusted he is feeling better overall.   [name] has been changing his medications to get better regulated.  He has had his Lasix d/c'd  and thus his weight has been going up.  His family is very supportive of his changes and they are  all working together on his diet.  His blood surgars have been around 125 in the morning and the highest has been 150.  He has had more issues with his sugars dropping and has already talked to his endocrinologist about it.  [name]'s blood pressures are staying in the 120s for the most part and he continues to check them daily at home.  He has been having some swelling in his hands since stopping the Lasix and plans to call Dr. Fletcher Anon when he gets home today.       Expected Outcomes  Short: Continue to work on weight loss and diabetes.  Long: Continue to monitor risk factors.   Short: Continue to work on weight loss.  Long: Continue to monitor heart failure and managing symptoms.          Exercise Goals and Review: Exercise Goals    Row Name 05/12/18 1352             Exercise Goals   Increase  Physical Activity  Yes       Intervention  Provide advice, education, support and counseling about physical activity/exercise needs.;Develop an individualized exercise prescription for aerobic and resistive training based on initial evaluation findings, risk stratification, comorbidities and participant's personal goals.       Expected Outcomes  Short Term: Attend rehab on a regular basis to increase amount of physical activity.;Long Term: Add in home exercise to make exercise part of routine and to increase amount of physical activity.;Long Term: Exercising regularly at least 3-5 days a week.       Increase Strength and Stamina  Yes       Intervention  Provide advice, education, support and counseling about physical activity/exercise needs.;Develop an individualized exercise prescription for aerobic and resistive training based on initial evaluation findings, risk stratification, comorbidities and participant's personal goals.       Expected Outcomes  Short Term: Increase workloads from initial exercise prescription for resistance, speed, and METs.;Short Term: Perform resistance training exercises routinely during rehab and add in resistance training at home;Long Term: Improve cardiorespiratory fitness, muscular endurance and strength as measured by increased METs and functional capacity (6MWT)       Able to understand and use rate of perceived exertion (RPE) scale  Yes       Intervention  Provide education and explanation on how to use RPE scale       Expected Outcomes  Short Term: Able to use RPE daily in rehab to express subjective intensity level;Long Term:  Able to use RPE to guide intensity level when exercising independently       Able to understand and use Dyspnea scale  Yes       Intervention  Provide education and explanation on how to use Dyspnea scale       Expected Outcomes  Short Term: Able to use Dyspnea scale daily in rehab to express subjective sense of shortness of breath during  exertion;Long Term: Able to use Dyspnea scale to guide intensity level when exercising independently       Knowledge and understanding of Target Heart Rate Range (THRR)  Yes       Intervention  Provide education and explanation of THRR including how the numbers were predicted and where they are located for reference       Expected Outcomes  Short Term: Able to state/look up THRR;Long Term: Able to use THRR to govern intensity when exercising independently;Short Term: Able to use daily as guideline for intensity  in rehab       Able to check pulse independently  Yes       Intervention  Provide education and demonstration on how to check pulse in carotid and radial arteries.;Review the importance of being able to check your own pulse for safety during independent exercise       Expected Outcomes  Short Term: Able to explain why pulse checking is important during independent exercise;Long Term: Able to check pulse independently and accurately       Understanding of Exercise Prescription  Yes       Intervention  Provide education, explanation, and written materials on patient's individual exercise prescription       Expected Outcomes  Short Term: Able to explain program exercise prescription;Long Term: Able to explain home exercise prescription to exercise independently          Nutrition & Weight - Outcomes: Pre Biometrics - 05/12/18 1351      Pre Biometrics   Height  5\' 8"  (1.727 m)    Weight  232 lb 3.2 oz (105.3 kg)    Waist Circumference  47 inches    Hip Circumference  45 inches    Waist to Hip Ratio  1.04 %    BMI (Calculated)  35.31    Single Leg Stand  1 seconds   neuropathy       Nutrition: Nutrition Therapy & Goals - 05/31/18 1029      Nutrition Therapy   Diet  DM    Protein (specify units)  12-13    Fiber  35 grams    Whole Grain Foods  3 servings   chooses whole grains regularly   Saturated Fats  15 max. grams    Fruits and Vegetables  5 servings/day   8 ideal; trying  to eat more vegetables, does not always eat 3 meals per day   Sodium  1500 grams      Personal Nutrition Goals   Nutrition Goal  Consume a protein source with meals and snacks, at least 1oz/7g, along with at least 1 serving (15g) of carbohydrate to help better regulate blood sugars    Personal Goal #2  Include more sources of non-starchy vegetables daily. This also helps to increase daily fiber intake to aid in short term BG control    Personal Goal #3  Eat a mid day meal/ snack more consistently. Try not to go more than 4-5 hours without eating. You may also want to try a snack @HS  to prevent low blood sugar readings upon waking in the morning    Comments  He has had diabetic education in the past and practices carb counting, but does not eat on a regular schedule most days and does not always practice eating balanced meals. He is limited to 64oz of fluid per day d/t CHF and c/o struggling to eat enough vegetables d/t disliking several varieties. He plans to try oatmeal for breakfast this week as a new heart-healthy option, as he tends to want to choose high-sugar cereals like [employer]uffs      Intervention Plan   Intervention  Prescribe, educate and counsel regarding individualized specific dietary modifications aiming towards targeted core components such as weight, hypertension, lipid management, diabetes, heart failure and other comorbidities.    Expected Outcomes  Short Term Goal: Understand basic principles of dietary content, such as calories, fat, sodium, cholesterol and nutrients.;Short Term Goal: A plan has been developed with personal nutrition goals set during dietitian appointment.;Long Term Goal:  Adherence to prescribed nutrition plan.       Nutrition Discharge: Nutrition Assessments - 05/12/18 1221      MEDFICTS Scores   Pre Score  55       Education Questionnaire Score: Knowledge Questionnaire Score - 05/12/18 1221      Knowledge Questionnaire Score   Pre Score  25/26    correct response reviewed with [name]. He vebalized understanding. No further questions today      Goals reviewed with patient; copy given to patient.

## 2018-08-08 ENCOUNTER — Telehealth: Payer: Self-pay | Admitting: Behavioral Health

## 2018-08-08 NOTE — Telephone Encounter (Signed)
Ashes there're a lot of abnormal labs I'm bothered most that cr I uo 1.58  He should stopAny diuretics he might be takingAn increase fluid intakeAnd see primary care physician as soon as possible to reassess his kidney function And overall clinicall condition   I will also try to call him myself now

## 2018-08-08 NOTE — Telephone Encounter (Signed)
Patient's wife called requesting results of lab work form 08/03/2018.  Informed her someone will call her back once Dr. Tommy Medal is notified.  Also she states an MRI was scheduled and she was calling about that.  Spoke to [employer] who is working on getting it scheduled and will call patient once it is scheduled. Pricilla Riffle RN

## 2018-08-09 DIAGNOSIS — I2510 Atherosclerotic heart disease of native coronary artery without angina pectoris: Secondary | ICD-10-CM | POA: Diagnosis not present

## 2018-08-09 DIAGNOSIS — N189 Chronic kidney disease, unspecified: Secondary | ICD-10-CM | POA: Diagnosis not present

## 2018-08-09 DIAGNOSIS — M4697 Unspecified inflammatory spondylopathy, lumbosacral region: Secondary | ICD-10-CM | POA: Diagnosis not present

## 2018-08-09 DIAGNOSIS — I130 Hypertensive heart and chronic kidney disease with heart failure and stage 1 through stage 4 chronic kidney disease, or unspecified chronic kidney disease: Secondary | ICD-10-CM | POA: Diagnosis not present

## 2018-08-09 DIAGNOSIS — E1122 Type 2 diabetes mellitus with diabetic chronic kidney disease: Secondary | ICD-10-CM | POA: Diagnosis not present

## 2018-08-09 DIAGNOSIS — J4540 Moderate persistent asthma, uncomplicated: Secondary | ICD-10-CM | POA: Diagnosis not present

## 2018-08-09 DIAGNOSIS — Z794 Long term (current) use of insulin: Secondary | ICD-10-CM | POA: Diagnosis not present

## 2018-08-09 DIAGNOSIS — K219 Gastro-esophageal reflux disease without esophagitis: Secondary | ICD-10-CM | POA: Diagnosis not present

## 2018-08-09 DIAGNOSIS — I5042 Chronic combined systolic (congestive) and diastolic (congestive) heart failure: Secondary | ICD-10-CM | POA: Diagnosis not present

## 2018-08-09 DIAGNOSIS — A6923 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: Secondary | ICD-10-CM | POA: Diagnosis not present

## 2018-08-09 DIAGNOSIS — E785 Hyperlipidemia, unspecified: Secondary | ICD-10-CM | POA: Diagnosis not present

## 2018-08-09 DIAGNOSIS — G7000 Myasthenia gravis without (acute) exacerbation: Secondary | ICD-10-CM | POA: Diagnosis not present

## 2018-08-09 DIAGNOSIS — G4733 Obstructive sleep apnea (adult) (pediatric): Secondary | ICD-10-CM | POA: Diagnosis not present

## 2018-08-09 DIAGNOSIS — A419 Sepsis, unspecified organism: Secondary | ICD-10-CM | POA: Diagnosis not present

## 2018-08-09 DIAGNOSIS — E1142 Type 2 diabetes mellitus with diabetic polyneuropathy: Secondary | ICD-10-CM | POA: Diagnosis not present

## 2018-08-09 DIAGNOSIS — R6520 Severe sepsis without septic shock: Secondary | ICD-10-CM | POA: Diagnosis not present

## 2018-08-10 ENCOUNTER — Encounter: Payer: Self-pay | Admitting: Family Medicine

## 2018-08-10 ENCOUNTER — Ambulatory Visit (INDEPENDENT_AMBULATORY_CARE_PROVIDER_SITE_OTHER): Payer: Medicare Other | Admitting: Family Medicine

## 2018-08-10 VITALS — BP 110/56 | HR 106 | Temp 98.1°F | Resp 16 | Ht 67.0 in | Wt 234.0 lb

## 2018-08-10 DIAGNOSIS — R7982 Elevated C-reactive protein (CRP): Secondary | ICD-10-CM | POA: Diagnosis not present

## 2018-08-10 DIAGNOSIS — R700 Elevated erythrocyte sedimentation rate: Secondary | ICD-10-CM

## 2018-08-10 DIAGNOSIS — Z8619 Personal history of other infectious and parasitic diseases: Secondary | ICD-10-CM | POA: Diagnosis not present

## 2018-08-10 DIAGNOSIS — R944 Abnormal results of kidney function studies: Secondary | ICD-10-CM

## 2018-08-10 DIAGNOSIS — E441 Mild protein-calorie malnutrition: Secondary | ICD-10-CM

## 2018-08-10 LAB — COMPLETE METABOLIC PANEL WITH GFR
AG Ratio: 1.5 (calc) (ref 1.0–2.5)
ALT: 17 U/L (ref 9–46)
AST: 24 U/L (ref 10–35)
Albumin: 3.1 g/dL — ABNORMAL LOW (ref 3.6–5.1)
Alkaline phosphatase (APISO): 65 U/L (ref 40–115)
BILIRUBIN TOTAL: 0.4 mg/dL (ref 0.2–1.2)
BUN/Creatinine Ratio: 28 (calc) — ABNORMAL HIGH (ref 6–22)
BUN: 45 mg/dL — AB (ref 7–25)
CALCIUM: 8.5 mg/dL — AB (ref 8.6–10.3)
CO2: 24 mmol/L (ref 20–32)
Chloride: 99 mmol/L (ref 98–110)
Creat: 1.58 mg/dL — ABNORMAL HIGH (ref 0.70–1.33)
GFR, EST AFRICAN AMERICAN: 55 mL/min/{1.73_m2} — AB (ref >=60)
GFR, Est Non African American: 47 mL/min/{1.73_m2} — ABNORMAL LOW (ref >=60)
GLUCOSE: 264 mg/dL — AB (ref 65–99)
Globulin: 2.1 g/dL (calc) (ref 1.9–3.7)
Potassium: 5.2 mmol/L (ref 3.5–5.3)
Sodium: 130 mmol/L — ABNORMAL LOW (ref 135–146)
TOTAL PROTEIN: 5.2 g/dL — AB (ref 6.1–8.1)

## 2018-08-10 LAB — C-REACTIVE PROTEIN: CRP: 158.1 mg/L — ABNORMAL HIGH (ref <8.0)

## 2018-08-10 LAB — CORTISOL: CORTISOL PLASMA: 18.9 ug/dL

## 2018-08-10 LAB — EPSTEIN-BARR VIRUS VCA ANTIBODY PANEL
EBV NA IgG: <18 U/mL
EBV VCA IGG: 184 U/mL — AB

## 2018-08-10 LAB — CULTURE, BLOOD (SINGLE)
MICRO NUMBER: 91306084
MICRO NUMBER: 91306085
RESULT: NO GROWTH
Result:: NO GROWTH
SPECIMEN QUALITY:: ADEQUATE
SPECIMEN QUALITY:: ADEQUATE

## 2018-08-10 LAB — CBC WITH DIFFERENTIAL/PLATELET
BASOS ABS: 21 {cells}/uL (ref 0–200)
Basophils Relative: 0.2 %
EOS ABS: 216 {cells}/uL (ref 15–500)
Eosinophils Relative: 2.1 %
HEMATOCRIT: 31.7 % — AB (ref 38.5–50.0)
Hemoglobin: 10 g/dL — ABNORMAL LOW (ref 13.2–17.1)
LYMPHS ABS: 639 {cells}/uL — AB (ref 850–3900)
MCH: 28.7 pg (ref 27.0–33.0)
MCHC: 31.5 g/dL — AB (ref 32.0–36.0)
MCV: 91.1 fL (ref 80.0–100.0)
MPV: 10.3 fL (ref 7.5–12.5)
Monocytes Relative: 5.9 %
NEUTROS ABS: 8817 {cells}/uL — AB (ref 1500–7800)
Neutrophils Relative %: 85.6 %
Platelets: 253 10*3/uL (ref 140–400)
RBC: 3.48 10*6/uL — ABNORMAL LOW (ref 4.20–5.80)
RDW: 13.6 % (ref 11.0–15.0)
Total Lymphocyte: 6.2 %
WBC: 10.3 10*3/uL (ref 3.8–10.8)
WBCMIX: 608 {cells}/uL (ref 200–950)

## 2018-08-10 LAB — PROTEIN ELECTROPHORESIS, SERUM
ALBUMIN ELP: 2.8 g/dL — AB (ref 3.8–4.8)
ALPHA 1: 0.4 g/dL — AB (ref 0.2–0.3)
Alpha 2: 1.1 g/dL — ABNORMAL HIGH (ref 0.5–0.9)
BETA 2: 0.3 g/dL (ref 0.2–0.5)
Beta Globulin: 0.4 g/dL (ref 0.4–0.6)
Gamma Globulin: 0.4 g/dL — ABNORMAL LOW (ref 0.8–1.7)
TOTAL PROTEIN: 5.4 g/dL — AB (ref 6.1–8.1)

## 2018-08-10 LAB — QUANTIFERON-TB GOLD PLUS
Mitogen-NIL: 7.32 IU/mL
NIL: 0.03 [IU]/mL
QuantiFERON-TB Gold Plus: NEGATIVE
TB1-NIL: 0.17 IU/mL
TB2-NIL: 0.16 IU/mL

## 2018-08-10 LAB — EHRLICHIA ANTIBODY PANEL: E. CHAFFEENSIS AB IGG: <1:64 {titer}

## 2018-08-10 LAB — EPSTEIN-BARR VIRUS EARLY D ANTIGEN ANTIBODY, IGG: EBV EA IGG: 54.2 U/mL — AB

## 2018-08-10 LAB — LACTATE DEHYDROGENASE: LDH: 185 U/L (ref 120–250)

## 2018-08-10 LAB — PROCALCITONIN: Procalcitonin: 2.81 ng/mL — ABNORMAL HIGH (ref <0.10)

## 2018-08-10 LAB — ROCKY MTN SPOTTED FVR ABS PNL(IGG+IGM)
RMSF IgG: NOT DETECTED
RMSF IgM: NOT DETECTED

## 2018-08-10 LAB — FERRITIN: Ferritin: 126 ng/mL (ref 38–380)

## 2018-08-10 LAB — ANA: Anti Nuclear Antibody(ANA): NEGATIVE

## 2018-08-10 LAB — SEDIMENTATION RATE: Sed Rate: 63 mm/h — ABNORMAL HIGH (ref 0–20)

## 2018-08-10 LAB — RHEUMATOID FACTOR

## 2018-08-10 LAB — CK: CK TOTAL: 34 U/L — AB (ref 44–196)

## 2018-08-10 NOTE — Progress Notes (Signed)
Name: [name]   MRN: [id number]    DOB: 02/28/1961   Date:08/10/2018       Progress Note  Subjective  Chief Complaint  Chief Complaint  Patient presents with  . Follow-up    HPI  FUO: patient came in today to discuss recent visit to ID. He states he needs repeat GFR since kidney function dropped ( explained that he was also a little dehydrated since BUN was up). His CRP and sed Rate were very high and Dr. Van Dam recommended transesophageal echo, he is also ordering MRI to evaluate his spine for possible infection. Patient states he is still feeling very tired, no fever or chills. His appetite has been good, however based on his labs he is malnourished. Rapid drop of albumin from recent illness. Discussed high protein diet and to incorporate protein shakes. He is anxious about the unknown. He will call back if he spikes a fever or feels worse. Explained that I don't have admission privileges but we can call Hospitalist to see if he can be directly admitted to [facility] if symptoms returns.    Patient Active Problem List   Diagnosis Date Noted  . FUO (fever of unknown origin) 08/03/2018  . Sepsis (HCC) 07/08/2018  . Colitis 07/08/2018  . OSA on CPAP 07/08/2018  . Acute renal failure superimposed on chronic kidney disease (HCC) 07/08/2018  . Unstable angina (HCC) 04/26/2018  . Ischemic cardiomyopathy 04/26/2018  . Chronic combined systolic and diastolic CHF (congestive heart failure) (HCC) 04/26/2018  . Effort angina (HCC) 04/25/2018  . 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HCC) 01/25/2018  . Hyperlipidemia due to type 2 diabetes mellitus (HCC) 08/12/2017  . Vitamin D deficiency, unspecified 08/12/2017  . Ptosis of left eyelid 01/08/2017  . Dermatitis 12/24/2016  . Difficulty walking 04/27/2016  . Foot cramps 04/27/2016  . Morbid (severe) obesity due to excess calories (HCC) 04/06/2016  . Benign neoplasm of sigmoid colon   . Benign neoplasm of descending colon   .  Benign neoplasm of transverse colon   . Coronary artery disease involving native coronary artery with angina pectoris (HCC) 10/22/2015  . Coronary artery disease 08/22/2015  . DM (diabetes mellitus), type 2, uncontrolled with complications (HCC) 08/22/2015  . Myasthenia gravis (HCC) 08/22/2015  . Chronic left-sided low back pain 08/22/2015  . Hypertension 08/22/2015  . GERD (gastroesophageal reflux disease) 08/22/2015  . Arthritis 08/22/2015  . Diabetic peripheral neuropathy (HCC) 08/22/2015  . Lyme disease 08/22/2015  . Hyperlipidemia 08/22/2015  . Insomnia 08/22/2015  . Type 2 diabetes mellitus with diabetic polyneuropathy, with long-term current use of insulin (HCC) 08/22/2015    Past Surgical History:  Procedure Laterality Date  . BILATERAL CARPAL TUNNEL RELEASE Bilateral L in 2012 and R in 2013  . CARDIAC CATHETERIZATION     Several Caths, most recent in  March 2016.  . COLONOSCOPY WITH PROPOFOL N/A 01/10/2016   Procedure: COLONOSCOPY WITH PROPOFOL;  Surgeon: Darren Wohl, MD;  Location: [facility] ENDOSCOPY;  Service: Endoscopy;  Laterality: N/A;  . CORONARY ANGIOPLASTY    . CORONARY STENT INTERVENTION N/A 04/25/2018   Procedure: CORONARY STENT INTERVENTION;  Surgeon: Arida, Muhammad A, MD;  Location: [facility];  Service: Cardiovascular;  Laterality: N/A;  . ESOPHAGOGASTRODUODENOSCOPY (EGD) WITH PROPOFOL N/A 01/10/2016   Procedure: ESOPHAGOGASTRODUODENOSCOPY (EGD) WITH PROPOFOL;  Surgeon: Darren Wohl, MD;  Location: [facility] ENDOSCOPY;  Service: Endoscopy;  Laterality: N/A;  . EYE SURGERY Bilateral 2012   cataract/bilateral vitrectomies  . LEFT HEART CATH AND CORONARY   ANGIOGRAPHY Left 04/25/2018   Procedure: LEFT HEART CATH AND CORONARY ANGIOGRAPHY;  Surgeon: [name], MD;  Location: [place] [facility];  Service: Cardiovascular;  Laterality: Left;  . TONSILLECTOMY AND ADENOIDECTOMY     As a child  . TUNNELED VENOUS CATHETER PLACEMENT     removed    Family History   Problem Relation Age of Onset  . Diabetes Mother   . Heart disease Mother   . Cancer Father        Prostate CA  . Diabetes Brother   . Healthy Brother   . Healthy Brother     Social History   Socioeconomic History  . Marital status: Married    Spouse name: Neoma Laming  . Number of children: 0  . Years of education: Not on file  . Highest education level: Master's degree (e.g., MA, MS, MEng, MEd, MSW, MBA)  Occupational History  . Occupation: disabled    Comment: multiple medical problems - first approved for uncontrolled DM, but now has heart disease and Myasthenia Gravis   Social Needs  . Financial resource strain: Not hard at all  . Food insecurity:    Worry: Never true    Inability: Never true  . Transportation needs:    Medical: No    Non-medical: No  Tobacco Use  . Smoking status: Never Smoker  . Smokeless tobacco: Never Used  . Tobacco comment: smoking cessation materials not required  Substance and Sexual Activity  . Alcohol use: Not Currently    Alcohol/week: 0.0 standard drinks  . Drug use: No  . Sexual activity: Not Currently    Partners: Female  Lifestyle  . Physical activity:    Days per week: 7 days    Minutes per session: 30 min  . Stress: Not at all  Relationships  . Social connections:    Talks on phone: More than three times a week    Gets together: Three times a week    Attends religious service: More than 4 times per year    Active member of club or organization: Yes    Attends meetings of clubs or organizations: More than 4 times per year    Relationship status: Married  . Intimate partner violence:    Fear of current or ex partner: No    Emotionally abused: No    Physically abused: No    Forced sexual activity: No  Other Topics Concern  . Not on file  Social History Narrative  . Not on file     Current Outpatient Medications:  .  albuterol (PROVENTIL HFA;VENTOLIN HFA) 108 (90 Base) MCG/ACT inhaler, Inhale 2 puffs into the lungs every  6 (six) hours as needed for wheezing or shortness of breath., Disp: , Rfl:  .  albuterol (PROVENTIL) (2.5 MG/3ML) 0.083% nebulizer solution, Take 2.5 mg by nebulization every 6 (six) hours as needed for wheezing or shortness of breath., Disp: , Rfl:  .  aspirin EC 81 MG tablet, Take 1 tablet (81 mg total) by mouth daily., Disp: 90 tablet, Rfl: 3 .  BREO ELLIPTA 200-25 MCG/INH AEPB, INHALE 1 PUFF INTO THE LUNGS DAILY (Patient taking differently: Inhale 1 puff into the lungs daily. ), Disp: 1 each, Rfl: 3 .  carvedilol (COREG) 6.25 MG tablet, Take 1 tablet (6.25 mg total) by mouth 2 (two) times daily., Disp: 180 tablet, Rfl: 3 .  clopidogrel (PLAVIX) 75 MG tablet, Take 1 tablet (75 mg total) by mouth daily with breakfast., Disp: 30 tablet, Rfl:  6 .  cyanocobalamin 1000 MCG tablet, Take 1,000 mcg by mouth daily. , Disp: , Rfl:  .  dexlansoprazole (DEXILANT) 60 MG capsule, Take 1 capsule (60 mg total) by mouth daily., Disp: 30 capsule, Rfl: 6 .  ezetimibe (ZETIA) 10 MG tablet, Take 1 tablet (10 mg total) by mouth daily., Disp: 90 tablet, Rfl: 3 .  gabapentin (NEURONTIN) 300 MG capsule, Take 1 capsule (300 mg total) by mouth 3 (three) times daily., Disp: 90 capsule, Rfl: 1 .  insulin lispro (HUMALOG KWIKPEN) 100 UNIT/ML KiwkPen, Inject 7-8 Units into the skin 3 (three) times daily. Reported on 12/02/2015/ sliding scale 1 unit for every 8 units of carbs; and 1 unit for every 20 above 120, Disp: , Rfl:  .  Insulin Pen Needle (FIFTY50 PEN NEEDLES) 32G X 4 MM MISC, Inject 1 each into the skin as needed., Disp: , Rfl:  .  LANTUS SOLOSTAR 100 UNIT/ML Solostar Pen, Inject 25-30 Units into the skin daily at 10 pm. , Disp: , Rfl: 0 .  levocetirizine (XYZAL) 5 MG tablet, Take 1 tablet (5 mg total) by mouth every evening., Disp: 30 tablet, Rfl: 3 .  liraglutide (VICTOZA) 18 MG/3ML SOPN, 1.8 units every morning (Patient taking differently: Inject 1.8 mg into the skin daily. ), Disp: , Rfl:  .  loratadine (CLARITIN) 10  MG tablet, Take 10 mg by mouth daily. , Disp: , Rfl:  .  metFORMIN (GLUCOPHAGE) 1000 MG tablet, Take 1,000 mg by mouth 2 (two) times daily with a meal., Disp: , Rfl:  .  naloxone (NARCAN) nasal spray 4 mg/0.1 mL, For excess sedation from opioids, Disp: 1 kit, Rfl: 2 .  nitroGLYCERIN (NITROSTAT) 0.3 MG SL tablet, Place 1 tablet (0.3 mg total) under the tongue every 5 (five) minutes as needed for chest pain. Reported on 12/02/2015, Disp: 25 tablet, Rfl: 3 .  rosuvastatin (CRESTOR) 5 MG tablet, Take 5 mg by mouth daily., Disp: , Rfl: 0 .  sacubitril-valsartan (ENTRESTO) 24-26 MG, Take 1 tablet by mouth 2 (two) times daily., Disp: 60 tablet, Rfl: 6  Allergies  Allergen Reactions  . Metoprolol Rash  . Novolog [Insulin Aspart] Hives    I personally reviewed active problem list, medication list, allergies, family history, social history with the patient/caregiver today.   ROS  Ten systems reviewed and is negative except as mentioned in HPI    Objective  Vitals:   08/10/18 1453  BP: (!) 110/56  Pulse: (!) 106  Resp: 16  Temp: 98.1 F (36.7 C)  TempSrc: Oral  SpO2: 97%  Weight: 234 lb (106.1 kg)  Height: 5' 7" (1.702 m)    Body mass index is 36.65 kg/m.  Physical Exam  Constitutional: Patient appears well-developed , still a little pale but looking better. No distress.  HEENT: head atraumatic, normocephalic, pupils equal and reactive to light, neck supple, throat within normal limits Cardiovascular: Normal rate, regular rhythm and normal heart sounds.  No murmur heard. No BLE edema. Pulmonary/Chest: Effort normal and breath sounds normal. No respiratory distress. Abdominal: Soft.  There is no tenderness. Muscular skeletal: using a cane  Psychiatric: Patient has a normal mood and affect. behavior is normal. Judgment and thought content normal.  PHQ2/9: Depression screen PHQ 2/9 08/10/2018 08/03/2018 07/26/2018 07/07/2018 06/21/2018  Decreased Interest 0 0 0 0 0  Down, Depressed,  Hopeless 0 1 1 0 1  PHQ - 2 Score 0 1 1 0 1  Altered sleeping 2 - 2 - -  Tired, decreased   energy 3 - 3 - -  Change in appetite 0 - 0 - -  Feeling bad or failure about yourself  0 - 0 - -  Trouble concentrating 1 - 1 - -  Moving slowly or fidgety/restless 0 - 0 - -  Suicidal thoughts 0 - 0 - -  PHQ-9 Score 6 - 7 - -  Difficult doing work/chores Not difficult at all - Somewhat difficult - -  Some recent data might be hidden     Fall Risk: Fall Risk  08/10/2018 08/03/2018 07/26/2018 07/07/2018 06/21/2018  Falls in the past year? 1 Yes No No No  Comment - - - - -  Number falls in past yr: 1 - - - -  Comment - - - - -  Injury with Fall? 1 - - - -  Risk Factor Category  - - - - -  Risk for fall due to : - - - - -  Risk for fall due to: Comment - - - - -  Follow up - - - - -     Functional Status Survey: Is the patient deaf or have difficulty hearing?: No Does the patient have difficulty seeing, even when wearing glasses/contacts?: No Does the patient have difficulty concentrating, remembering, or making decisions?: No Does the patient have difficulty walking or climbing stairs?: Yes Does the patient have difficulty dressing or bathing?: No Does the patient have difficulty doing errands alone such as visiting a doctor's office or shopping?: No    Assessment & Plan  1. Mild protein-calorie malnutrition (HCC)  Drop in protein level, feeling weak, discussed increase in protein intake  2. Elevated C-reactive protein (CRP)  Over 100 and WBC going up , still unknown cause of sepsis  3. Elevated sed rate  Fever or unknown origin   4. Decreased GFR  Recheck levels as recommended by Dr. Tommy Medal   5. History of sepsis  Seen by ID and is going to follow up with Dr. Fletcher Anon next week, Dr. Tommy Medal recommended transesophageal echo

## 2018-08-11 ENCOUNTER — Ambulatory Visit: Payer: Self-pay

## 2018-08-11 DIAGNOSIS — A6923 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: Secondary | ICD-10-CM | POA: Diagnosis not present

## 2018-08-11 DIAGNOSIS — I5042 Chronic combined systolic (congestive) and diastolic (congestive) heart failure: Secondary | ICD-10-CM | POA: Diagnosis not present

## 2018-08-11 DIAGNOSIS — N189 Chronic kidney disease, unspecified: Secondary | ICD-10-CM | POA: Diagnosis not present

## 2018-08-11 DIAGNOSIS — G4733 Obstructive sleep apnea (adult) (pediatric): Secondary | ICD-10-CM | POA: Diagnosis not present

## 2018-08-11 DIAGNOSIS — R6520 Severe sepsis without septic shock: Secondary | ICD-10-CM | POA: Diagnosis not present

## 2018-08-11 DIAGNOSIS — I130 Hypertensive heart and chronic kidney disease with heart failure and stage 1 through stage 4 chronic kidney disease, or unspecified chronic kidney disease: Secondary | ICD-10-CM | POA: Diagnosis not present

## 2018-08-11 DIAGNOSIS — E1122 Type 2 diabetes mellitus with diabetic chronic kidney disease: Secondary | ICD-10-CM | POA: Diagnosis not present

## 2018-08-11 DIAGNOSIS — G7000 Myasthenia gravis without (acute) exacerbation: Secondary | ICD-10-CM | POA: Diagnosis not present

## 2018-08-11 DIAGNOSIS — J4540 Moderate persistent asthma, uncomplicated: Secondary | ICD-10-CM | POA: Diagnosis not present

## 2018-08-11 DIAGNOSIS — K219 Gastro-esophageal reflux disease without esophagitis: Secondary | ICD-10-CM | POA: Diagnosis not present

## 2018-08-11 DIAGNOSIS — I2510 Atherosclerotic heart disease of native coronary artery without angina pectoris: Secondary | ICD-10-CM | POA: Diagnosis not present

## 2018-08-11 DIAGNOSIS — Z794 Long term (current) use of insulin: Secondary | ICD-10-CM | POA: Diagnosis not present

## 2018-08-11 DIAGNOSIS — E785 Hyperlipidemia, unspecified: Secondary | ICD-10-CM | POA: Diagnosis not present

## 2018-08-11 DIAGNOSIS — E1142 Type 2 diabetes mellitus with diabetic polyneuropathy: Secondary | ICD-10-CM | POA: Diagnosis not present

## 2018-08-11 DIAGNOSIS — M4697 Unspecified inflammatory spondylopathy, lumbosacral region: Secondary | ICD-10-CM | POA: Diagnosis not present

## 2018-08-11 DIAGNOSIS — A419 Sepsis, unspecified organism: Secondary | ICD-10-CM | POA: Diagnosis not present

## 2018-08-11 LAB — CBC WITH DIFFERENTIAL/PLATELET
BASOS PCT: 0.3 %
Basophils Absolute: 26 cells/uL (ref 0–200)
Eosinophils Absolute: 123 cells/uL (ref 15–500)
Eosinophils Relative: 1.4 %
HCT: 32 % — ABNORMAL LOW (ref 38.5–50.0)
Hemoglobin: 10.3 g/dL — ABNORMAL LOW (ref 13.2–17.1)
LYMPHS ABS: 1065 {cells}/uL (ref 850–3900)
MCH: 28.4 pg (ref 27.0–33.0)
MCHC: 32.2 g/dL (ref 32.0–36.0)
MCV: 88.2 fL (ref 80.0–100.0)
MPV: 9.9 fL (ref 7.5–12.5)
Monocytes Relative: 7.3 %
NEUTROS PCT: 78.9 %
Neutro Abs: 6943 cells/uL (ref 1500–7800)
PLATELETS: 332 10*3/uL (ref 140–400)
RBC: 3.63 10*6/uL — AB (ref 4.20–5.80)
RDW: 13.5 % (ref 11.0–15.0)
TOTAL LYMPHOCYTE: 12.1 %
WBC: 8.8 10*3/uL (ref 3.8–10.8)
WBCMIX: 642 {cells}/uL (ref 200–950)

## 2018-08-11 LAB — BASIC METABOLIC PANEL WITH GFR
BUN: 18 mg/dL (ref 7–25)
CO2: 27 mmol/L (ref 20–32)
CREATININE: 1.07 mg/dL (ref 0.70–1.33)
Calcium: 8.9 mg/dL (ref 8.6–10.3)
Chloride: 105 mmol/L (ref 98–110)
GFR, Est African American: 88 mL/min/{1.73_m2} (ref >=60)
GFR, Est Non African American: 76 mL/min/{1.73_m2} (ref >=60)
GLUCOSE: 91 mg/dL (ref 65–139)
POTASSIUM: 5.1 mmol/L (ref 3.5–5.3)
SODIUM: 138 mmol/L (ref 135–146)

## 2018-08-11 NOTE — Telephone Encounter (Signed)
Please check on patient. He will need an exam. I cannot call in antibiotics

## 2018-08-11 NOTE — Telephone Encounter (Signed)
Patient's wife returned call and says he is vomiting, decreased hearing, off balance. I advised of her speaking to the [place] earlier this morning, she says that she couldn't hear what was being said. I advised that the note was completed and sent to Dr. Ancil Boozer who says he will need an appointment for an exam, she cannot call in antibiotics. Patient's wife says ok to the appointment. Appointment scheduled for tomorrow, 08/12/18 at 1500 with Dr. Ancil Boozer. Care advice given, wife verbalized understanding.  Reason for Disposition . [1] Decreased hearing in 1 ear AND [2] gradual onset  Protocols used: HEARING LOSS OR CHANGE-A-AH

## 2018-08-11 NOTE — Telephone Encounter (Signed)
This encounter was created in error - please disregard.

## 2018-08-11 NOTE — Telephone Encounter (Signed)
[name] has already left for today. Pt stated that [name] does not feel bad enough to go to urgent care. [name] already have an appt scheduled with you for tomorrow.

## 2018-08-11 NOTE — Telephone Encounter (Signed)
Pt.'s wife reports pt. Got up this morning, unable to hear out of his left ear."It is not ear wax  - he has an ear infection." Per wife. He is "off balance and feels like he has fluid in his ear." Refuses office visit. Wife reports "He needs an antibiotic and we don't need to talk to you. We need to talk to Dr. Ancil Boozer." Benson Norway up on Triage. Please advise.   Answer Assessment - Initial Assessment Questions 1. DESCRIPTION: "What type of hearing problem are you having? Describe it for me." (e.g., complete hearing loss, partial loss)     Can't hear out of his left ear this morning 2. LOCATION: "One or both ears?" If one, ask: "Which ear?"     Left ear 3. SEVERITY: "Can you hear anything?" If so, ask: "What can you hear?" (e.g., ticking watch, whisper, talking)     Can not hear anything 4. ONSET: "When did this begin?" "Did it start suddenly or come on gradually?"     Woke up this morning with it 5. PATTERN: "Does this come and go, or has it been constant since it started?"     Constant 6. PAIN: "Is there any pain in your ear(s)?"  (Scale 1-10; or mild, moderate, severe)     No pain 7. CAUSE: "What do you think is causing this hearing problem?"     Wife states he has an ear infection - it is not ear wax. 8. OTHER SYMPTOMS: "Do you have any other symptoms?" (e.g., dizziness, ringing in ears)     He is off balance 9. PREGNANCY: "Is there any chance you are pregnant?" "When was your last menstrual period?"     n/a  Protocols used: HEARING LOSS OR CHANGE-A-AH

## 2018-08-11 NOTE — Telephone Encounter (Signed)
Is there anyway he can see someone today? Even urgent care if needed? I am worried about him

## 2018-08-12 ENCOUNTER — Encounter: Payer: Self-pay | Admitting: Family Medicine

## 2018-08-12 ENCOUNTER — Ambulatory Visit (INDEPENDENT_AMBULATORY_CARE_PROVIDER_SITE_OTHER): Payer: Medicare Other | Admitting: Family Medicine

## 2018-08-12 VITALS — BP 92/60 | HR 112 | Temp 98.0°F | Resp 16 | Ht 67.0 in | Wt 231.3 lb

## 2018-08-12 DIAGNOSIS — R509 Fever, unspecified: Secondary | ICD-10-CM

## 2018-08-12 DIAGNOSIS — H60313 Diffuse otitis externa, bilateral: Secondary | ICD-10-CM | POA: Diagnosis not present

## 2018-08-12 DIAGNOSIS — H9193 Unspecified hearing loss, bilateral: Secondary | ICD-10-CM

## 2018-08-12 DIAGNOSIS — R2689 Other abnormalities of gait and mobility: Secondary | ICD-10-CM

## 2018-08-12 MED ORDER — CIPROFLOXACIN-HYDROCORTISONE 0.2-1 % OT SUSP: 3.0000 [drp] | Freq: Two times a day (BID) | OTIC | 0 refills | Status: DC

## 2018-08-12 NOTE — Progress Notes (Signed)
Name: [name]   MRN: [id number]    DOB: Mar 02, 1961   Date:08/12/2018       Progress Note  Subjective  Chief Complaint  Chief Complaint  Patient presents with  . balance    when he stands up and walk   . Ear Pain    right ear  . Hearing Problem    left ear. patient stated that it feels like it's all blocked  . Facial Pain    pressure on the left side near cheek bone  . Emesis    combination of bile and mucus  . Anorexia    HPI  [name] developed bilateral ear pain, fullness and balance difficulties yesterday . He denies any fever, but has nausea, lack of appetite and is very concerned because he is still waiting to have MRI spine for evaluation of fever of unknown origin that had him hospitalized twice. His wife came in with him. He walked in with a cane. He denies tinnitus. His balance can go off with myasthenia gravis however he states this time it is different. He also has ptosis on left eye - that is stable and expected at the end of the day.   Patient Active Problem List   Diagnosis Date Noted  . FUO (fever of unknown origin) 08/03/2018  . Sepsis ([place]) 07/08/2018  . Colitis 07/08/2018  . OSA on CPAP 07/08/2018  . Acute renal failure superimposed on chronic kidney disease ([place]) 07/08/2018  . Unstable angina ([place]) 04/26/2018  . Ischemic cardiomyopathy 04/26/2018  . Chronic combined systolic and diastolic CHF (congestive heart failure) ([place]) 04/26/2018  . Effort angina ([place]) 04/25/2018  . 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[place]) 01/25/2018  . Hyperlipidemia due to type 2 diabetes mellitus ([place]) 08/12/2017  . Vitamin D deficiency, unspecified 08/12/2017  . Ptosis of left eyelid 01/08/2017  . Dermatitis 12/24/2016  . Difficulty walking 04/27/2016  . Foot cramps 04/27/2016  . Morbid (severe) obesity due to excess calories ([place]) 04/06/2016  . Benign neoplasm of sigmoid colon   . Benign neoplasm of descending colon   . Benign neoplasm of transverse  colon   . Coronary artery disease involving native coronary artery with angina pectoris ([place]) 10/22/2015  . Coronary artery disease 08/22/2015  . DM (diabetes mellitus), type 2, uncontrolled with complications ([place]) 42/59/5638  . Myasthenia gravis ([place]) 08/22/2015  . Chronic left-sided low back pain 08/22/2015  . Hypertension 08/22/2015  . GERD (gastroesophageal reflux disease) 08/22/2015  . Arthritis 08/22/2015  . Diabetic peripheral neuropathy ([place]) 08/22/2015  . Lyme disease 08/22/2015  . Hyperlipidemia 08/22/2015  . Insomnia 08/22/2015  . Type 2 diabetes mellitus with diabetic polyneuropathy, with long-term current use of insulin ([place]) 08/22/2015    Past Surgical History:  Procedure Laterality Date  . BILATERAL CARPAL TUNNEL RELEASE Bilateral L in 2012 and R in 2013  . CARDIAC CATHETERIZATION     Several Caths, most recent in  March 2016.  [name] COLONOSCOPY WITH PROPOFOL N/A 01/10/2016   Procedure: COLONOSCOPY WITH PROPOFOL;  Surgeon: [name], MD;  Location: [facility] END[facility]COPY;  Service: Endoscopy;  Laterality: N/A;  . CORONARY ANGIOPLASTY    . CORONARY STENT INTERVENTION N/A 04/25/2018   Procedure: CORONARY STENT INTERVENTION;  Surgeon: [name], MD;  Location: [facility];  Service: Cardiovascular;  Laterality: N/A;  . ESOPHAGOGASTRODUODENOSCOPY (EGD) WITH PROPOFOL N/A 01/10/2016   Procedure: ESOPHAGOGASTRODUODENOSCOPY (EGD) WITH PROPOFOL;  Surgeon: [name], MD;  Location: [facility] ENDOSCOPY;  Service: Endoscopy;  Laterality: N/A;  .  EYE SURGERY Bilateral 2012   cataract/bilateral vitrectomies  . LEFT HEART CATH AND CORONARY ANGIOGRAPHY Left 04/25/2018   Procedure: LEFT HEART CATH AND CORONARY ANGIOGRAPHY;  Surgeon: [name], MD;  Location: [facility];  Service: Cardiovascular;  Laterality: Left;  . TONSILLECTOMY AND ADENOIDECTOMY     As a child  . TUNNELED VENOUS CATHETER PLACEMENT     removed    Family History  Problem Relation Age of Onset   . Diabetes Mother   . Heart disease Mother   . Cancer Father        Prostate CA  . Diabetes Brother   . Healthy Brother   . Healthy Brother     Social History   Socioeconomic History  . Marital status: Married    Spouse name: Neoma Laming  . Number of children: 0  . Years of education: Not on file  . Highest education level: Master's degree (e.g., MA, MS, MEng, MEd, MSW, MBA)  Occupational History  . Occupation: disabled    Comment: multiple medical problems - first approved for uncontrolled DM, but now has heart disease and Myasthenia Gravis   Social Needs  . Financial resource strain: Not hard at all  . Food insecurity:    Worry: Never true    Inability: Never true  . Transportation needs:    Medical: No    Non-medical: No  Tobacco Use  . Smoking status: Never Smoker  . Smokeless tobacco: Never Used  . Tobacco comment: smoking cessation materials not required  Substance and Sexual Activity  . Alcohol use: Not Currently    Alcohol/week: 0.0 standard drinks  . Drug use: No  . Sexual activity: Not Currently    Partners: Female  Lifestyle  . Physical activity:    Days per week: 7 days    Minutes per session: 30 min  . Stress: Not at all  Relationships  . Social connections:    Talks on phone: More than three times a week    Gets together: Three times a week    Attends religious service: More than 4 times per year    Active member of club or organization: Yes    Attends meetings of clubs or organizations: More than 4 times per year    Relationship status: Married  . Intimate partner violence:    Fear of current or ex partner: No    Emotionally abused: No    Physically abused: No    Forced sexual activity: No  Other Topics Concern  . Not on file  Social History Narrative  . Not on file     Current Outpatient Medications:  .  albuterol (PROVENTIL HFA;VENTOLIN HFA) 108 (90 Base) MCG/ACT inhaler, Inhale 2 puffs into the lungs every 6 (six) hours as needed for  wheezing or shortness of breath., Disp: , Rfl:  .  albuterol (PROVENTIL) (2.5 MG/3ML) 0.083% nebulizer solution, Take 2.5 mg by nebulization every 6 (six) hours as needed for wheezing or shortness of breath., Disp: , Rfl:  .  aspirin EC 81 MG tablet, Take 1 tablet (81 mg total) by mouth daily., Disp: 90 tablet, Rfl: 3 .  BREO ELLIPTA 200-25 MCG/INH AEPB, INHALE 1 PUFF INTO THE LUNGS DAILY (Patient taking differently: Inhale 1 puff into the lungs daily. ), Disp: 1 each, Rfl: 3 .  carvedilol (COREG) 6.25 MG tablet, Take 1 tablet (6.25 mg total) by mouth 2 (two) times daily., Disp: 180 tablet, Rfl: 3 .  ciprofloxacin-hydrocortisone (CIPRO HC) OTIC suspension,  Place 3 drops into both ears 2 (two) times daily., Disp: 10 mL, Rfl: 0 .  clopidogrel (PLAVIX) 75 MG tablet, Take 1 tablet (75 mg total) by mouth daily with breakfast., Disp: 30 tablet, Rfl: 6 .  cyanocobalamin 1000 MCG tablet, Take 1,000 mcg by mouth daily. , Disp: , Rfl:  .  dexlansoprazole (DEXILANT) 60 MG capsule, Take 1 capsule (60 mg total) by mouth daily., Disp: 30 capsule, Rfl: 6 .  ezetimibe (ZETIA) 10 MG tablet, Take 1 tablet (10 mg total) by mouth daily., Disp: 90 tablet, Rfl: 3 .  gabapentin (NEURONTIN) 300 MG capsule, Take 1 capsule (300 mg total) by mouth 3 (three) times daily., Disp: 90 capsule, Rfl: 1 .  insulin lispro (HUMALOG KWIKPEN) 100 UNIT/ML KiwkPen, Inject 7-8 Units into the skin 3 (three) times daily. Reported on 12/02/2015/ sliding scale 1 unit for every 8 units of carbs; and 1 unit for every 20 above 120, Disp: , Rfl:  .  Insulin Pen Needle (FIFTY50 PEN NEEDLES) 32G X 4 MM MISC, Inject 1 each into the skin as needed., Disp: , Rfl:  .  LANTUS SOLOSTAR 100 UNIT/ML Solostar Pen, Inject 25-30 Units into the skin daily at 10 pm. , Disp: , Rfl: 0 .  levocetirizine (XYZAL) 5 MG tablet, Take 1 tablet (5 mg total) by mouth every evening., Disp: 30 tablet, Rfl: 3 .  liraglutide (VICTOZA) 18 MG/3ML SOPN, 1.8 units every morning  (Patient taking differently: Inject 1.8 mg into the skin daily. ), Disp: , Rfl:  .  loratadine (CLARITIN) 10 MG tablet, Take 10 mg by mouth daily. , Disp: , Rfl:  .  metFORMIN (GLUCOPHAGE) 1000 MG tablet, Take 1,000 mg by mouth 2 (two) times daily with a meal., Disp: , Rfl:  .  naloxone (NARCAN) nasal spray 4 mg/0.1 mL, For excess sedation from opioids, Disp: 1 kit, Rfl: 2 .  nitroGLYCERIN (NITROSTAT) 0.3 MG SL tablet, Place 1 tablet (0.3 mg total) under the tongue every 5 (five) minutes as needed for chest pain. Reported on 12/02/2015, Disp: 25 tablet, Rfl: 3 .  rosuvastatin (CRESTOR) 5 MG tablet, Take 5 mg by mouth daily., Disp: , Rfl: 0 .  sacubitril-valsartan (ENTRESTO) 24-26 MG, Take 1 tablet by mouth 2 (two) times daily., Disp: 60 tablet, Rfl: 6  Allergies  Allergen Reactions  . Metoprolol Rash  . Novolog [Insulin Aspart] Hives    I personally reviewed active problem list, medication list, allergies, family history with the patient/caregiver today.   ROS  Ten systems reviewed and is negative except as mentioned in HPI   Objective  Vitals:   08/12/18 1545  BP: 92/60  Pulse: (!) 112  Resp: 16  Temp: 98 F (36.7 C)  TempSrc: Oral  SpO2: 97%  Weight: 231 lb 4.8 oz (104.9 kg)  Height: 5' 7" (1.702 m)    Body mass index is 36.23 kg/m.  Physical Exam  Constitutional: Patient appears well-developed and well-nourished. Obese  No distress.  HEENT: head atraumatic, normocephalic, pupils equal and reactive to light, ears : ear canal is swollen on both side, not much redness, TM normal,  neck supple, throat within normal limits, ptosis on left side  Cardiovascular: Normal rate, regular rhythm and normal heart sounds.  No murmur heard. No BLE edema. Pulmonary/Chest: Effort normal and breath sounds normal. No respiratory distress. Abdominal: Soft.  There is no tenderness. Neurological: he had difficulty standing with eyes closed, very slow gait and needed support from cane.  Cranial nerves intact, ptosis  from myasthenia  Psychiatric: Patient has a normal mood and affect. behavior is normal. Judgment and thought content normal.   PHQ2/9: Depression screen [facility] 2/9 08/12/2018 08/10/2018 08/03/2018 07/26/2018 07/07/2018  Decreased Interest 0 0 0 0 0  Down, Depressed, Hopeless 0 0 1 1 0  PHQ - 2 Score 0 0 1 1 0  Altered sleeping 0 2 - 2 -  Tired, decreased energy 1 3 - 3 -  Change in appetite 1 0 - 0 -  Feeling bad or failure about yourself  0 0 - 0 -  Trouble concentrating 0 1 - 1 -  Moving slowly or fidgety/restless 0 0 - 0 -  Suicidal thoughts 0 0 - 0 -  PHQ-9 Score 2 6 - 7 -  Difficult doing work/chores Somewhat difficult Not difficult at all - Somewhat difficult -  Some recent data might be hidden     Fall Risk: Fall Risk  08/12/2018 08/10/2018 08/03/2018 07/26/2018 07/07/2018  Falls in the past year? 1 1 Yes No No  Comment - - - - -  Number falls in past yr: 1 1 - - -  Comment - - - - -  Injury with Fall? 1 1 - - -  Risk Factor Category  - - - - -  Risk for fall due to : History of fall(s);Impaired balance/gait - - - -  Risk for fall due to: Comment - - - - -  Follow up - - - - -     Functional Status Survey: Is the patient deaf or have difficulty hearing?: No Does the patient have difficulty seeing, even when wearing glasses/contacts?: No Does the patient have difficulty concentrating, remembering, or making decisions?: No Does the patient have difficulty walking or climbing stairs?: Yes Does the patient have difficulty dressing or bathing?: No Does the patient have difficulty doing errands alone such as visiting a doctor's office or shopping?: No    Assessment & Plan  1. Acute hearing loss of both ears  - MR BRAIN W WO CONTRAST; Future  2. Fever, unknown origin  - MR BRAIN W WO CONTRAST; Future  3. Balance problem  - MR BRAIN W WO CONTRAST; Future  4. Acute diffuse otitis externa of both ears  - ciprofloxacin-hydrocortisone  (CIPRO HC) OTIC suspension; Place 3 drops into both ears 2 (two) times daily.  Dispense: 10 mL; Refill: 0  Explained that ear infection seems unrelated but with all his other problems better to treat and check MRI because of significant balance problems and recent hospitalization, we got PA for MRI for Monday evening, but he was advised to go to [facility] over the weekend if he gets worse or develops a fever.

## 2018-08-13 ENCOUNTER — Encounter: Payer: Self-pay | Admitting: Family Medicine

## 2018-08-15 ENCOUNTER — Ambulatory Visit
Admission: RE | Admit: 2018-08-15 | Discharge: 2018-08-15 | Disposition: A | Discharge status: Home / Self Care | Payer: Medicare Other | Source: Ambulatory Visit | Attending: Family Medicine | Admitting: Family Medicine

## 2018-08-15 DIAGNOSIS — R2689 Other abnormalities of gait and mobility: Secondary | ICD-10-CM | POA: Diagnosis not present

## 2018-08-15 DIAGNOSIS — H9193 Unspecified hearing loss, bilateral: Secondary | ICD-10-CM | POA: Diagnosis not present

## 2018-08-15 DIAGNOSIS — H9201 Otalgia, right ear: Secondary | ICD-10-CM | POA: Diagnosis not present

## 2018-08-15 DIAGNOSIS — H9202 Otalgia, left ear: Secondary | ICD-10-CM | POA: Diagnosis not present

## 2018-08-15 DIAGNOSIS — R509 Fever, unspecified: Secondary | ICD-10-CM | POA: Diagnosis not present

## 2018-08-15 DIAGNOSIS — R270 Ataxia, unspecified: Secondary | ICD-10-CM | POA: Diagnosis not present

## 2018-08-15 MED ORDER — GADOBUTROL 1 MMOL/ML IV SOLN
10.0000 mL | Freq: Once | INTRAVENOUS | Status: AC | PRN
  Administered 2018-08-15: 10 mL via INTRAVENOUS

## 2018-08-16 ENCOUNTER — Ambulatory Visit: Payer: Self-pay | Admitting: Family Medicine

## 2018-08-16 DIAGNOSIS — I130 Hypertensive heart and chronic kidney disease with heart failure and stage 1 through stage 4 chronic kidney disease, or unspecified chronic kidney disease: Secondary | ICD-10-CM | POA: Diagnosis not present

## 2018-08-16 DIAGNOSIS — A6923 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: Secondary | ICD-10-CM | POA: Diagnosis not present

## 2018-08-16 DIAGNOSIS — K219 Gastro-esophageal reflux disease without esophagitis: Secondary | ICD-10-CM | POA: Diagnosis not present

## 2018-08-16 DIAGNOSIS — G7000 Myasthenia gravis without (acute) exacerbation: Secondary | ICD-10-CM | POA: Diagnosis not present

## 2018-08-16 DIAGNOSIS — A419 Sepsis, unspecified organism: Secondary | ICD-10-CM | POA: Diagnosis not present

## 2018-08-16 DIAGNOSIS — M4697 Unspecified inflammatory spondylopathy, lumbosacral region: Secondary | ICD-10-CM | POA: Diagnosis not present

## 2018-08-16 DIAGNOSIS — J4540 Moderate persistent asthma, uncomplicated: Secondary | ICD-10-CM | POA: Diagnosis not present

## 2018-08-16 DIAGNOSIS — E1122 Type 2 diabetes mellitus with diabetic chronic kidney disease: Secondary | ICD-10-CM | POA: Diagnosis not present

## 2018-08-16 DIAGNOSIS — I5042 Chronic combined systolic (congestive) and diastolic (congestive) heart failure: Secondary | ICD-10-CM | POA: Diagnosis not present

## 2018-08-16 DIAGNOSIS — E1142 Type 2 diabetes mellitus with diabetic polyneuropathy: Secondary | ICD-10-CM | POA: Diagnosis not present

## 2018-08-16 DIAGNOSIS — E785 Hyperlipidemia, unspecified: Secondary | ICD-10-CM | POA: Diagnosis not present

## 2018-08-16 DIAGNOSIS — I2510 Atherosclerotic heart disease of native coronary artery without angina pectoris: Secondary | ICD-10-CM | POA: Diagnosis not present

## 2018-08-16 DIAGNOSIS — R6520 Severe sepsis without septic shock: Secondary | ICD-10-CM | POA: Diagnosis not present

## 2018-08-16 DIAGNOSIS — N189 Chronic kidney disease, unspecified: Secondary | ICD-10-CM | POA: Diagnosis not present

## 2018-08-16 DIAGNOSIS — Z794 Long term (current) use of insulin: Secondary | ICD-10-CM | POA: Diagnosis not present

## 2018-08-16 DIAGNOSIS — G4733 Obstructive sleep apnea (adult) (pediatric): Secondary | ICD-10-CM | POA: Diagnosis not present

## 2018-08-18 ENCOUNTER — Ambulatory Visit: Payer: Medicare Other | Admitting: Nurse Practitioner

## 2018-08-18 ENCOUNTER — Encounter: Payer: Self-pay | Admitting: Nurse Practitioner

## 2018-08-18 VITALS — BP 124/72 | HR 108 | Ht 67.0 in | Wt 232.0 lb

## 2018-08-18 DIAGNOSIS — J4540 Moderate persistent asthma, uncomplicated: Secondary | ICD-10-CM | POA: Diagnosis not present

## 2018-08-18 DIAGNOSIS — I25119 Atherosclerotic heart disease of native coronary artery with unspecified angina pectoris: Secondary | ICD-10-CM | POA: Diagnosis not present

## 2018-08-18 DIAGNOSIS — E785 Hyperlipidemia, unspecified: Secondary | ICD-10-CM | POA: Diagnosis not present

## 2018-08-18 DIAGNOSIS — I10 Essential (primary) hypertension: Secondary | ICD-10-CM | POA: Diagnosis not present

## 2018-08-18 DIAGNOSIS — E1142 Type 2 diabetes mellitus with diabetic polyneuropathy: Secondary | ICD-10-CM | POA: Diagnosis not present

## 2018-08-18 DIAGNOSIS — I5042 Chronic combined systolic (congestive) and diastolic (congestive) heart failure: Secondary | ICD-10-CM | POA: Diagnosis not present

## 2018-08-18 DIAGNOSIS — I255 Ischemic cardiomyopathy: Secondary | ICD-10-CM | POA: Diagnosis not present

## 2018-08-18 DIAGNOSIS — M4697 Unspecified inflammatory spondylopathy, lumbosacral region: Secondary | ICD-10-CM | POA: Diagnosis not present

## 2018-08-18 DIAGNOSIS — R000 Tachycardia, unspecified: Secondary | ICD-10-CM

## 2018-08-18 DIAGNOSIS — E119 Type 2 diabetes mellitus without complications: Secondary | ICD-10-CM

## 2018-08-18 DIAGNOSIS — N189 Chronic kidney disease, unspecified: Secondary | ICD-10-CM | POA: Diagnosis not present

## 2018-08-18 DIAGNOSIS — I130 Hypertensive heart and chronic kidney disease with heart failure and stage 1 through stage 4 chronic kidney disease, or unspecified chronic kidney disease: Secondary | ICD-10-CM | POA: Diagnosis not present

## 2018-08-18 DIAGNOSIS — K219 Gastro-esophageal reflux disease without esophagitis: Secondary | ICD-10-CM | POA: Diagnosis not present

## 2018-08-18 DIAGNOSIS — A6923 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: Secondary | ICD-10-CM | POA: Diagnosis not present

## 2018-08-18 DIAGNOSIS — E1122 Type 2 diabetes mellitus with diabetic chronic kidney disease: Secondary | ICD-10-CM | POA: Diagnosis not present

## 2018-08-18 DIAGNOSIS — Z794 Long term (current) use of insulin: Secondary | ICD-10-CM | POA: Diagnosis not present

## 2018-08-18 DIAGNOSIS — R6520 Severe sepsis without septic shock: Secondary | ICD-10-CM | POA: Diagnosis not present

## 2018-08-18 DIAGNOSIS — G7000 Myasthenia gravis without (acute) exacerbation: Secondary | ICD-10-CM | POA: Diagnosis not present

## 2018-08-18 DIAGNOSIS — A419 Sepsis, unspecified organism: Secondary | ICD-10-CM | POA: Diagnosis not present

## 2018-08-18 DIAGNOSIS — G4733 Obstructive sleep apnea (adult) (pediatric): Secondary | ICD-10-CM | POA: Diagnosis not present

## 2018-08-18 DIAGNOSIS — I2510 Atherosclerotic heart disease of native coronary artery without angina pectoris: Secondary | ICD-10-CM | POA: Diagnosis not present

## 2018-08-18 MED ORDER — NITROGLYCERIN 0.3 MG SL SUBL: 0.3000 mg | SUBLINGUAL_TABLET | SUBLINGUAL | 3 refills | Status: DC | PRN

## 2018-08-18 NOTE — Progress Notes (Signed)
Office Visit    Patient Name: [name] Date of Encounter: 08/18/2018  Primary Care Provider:  [name], MD Primary Cardiologist:  [name], MD  Chief Complaint    57 year old male with a history of CAD status post multiple PCI's, hypertension, hyperlipidemia, type 2 diabetes mellitus, diabetic neuropathy, myasthenia gravis, and obstructive sleep apnea, who presents for follow-up after recent hospitalizations related to sepsis and fever of unknown origin complicated by non-STEMI.  Past Medical History    Past Medical History:  Diagnosis Date  . Allergy    dust, seasonal (worse in the fall).  . 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 Followed by Pain Specialist in [place], back and neck  . Asthma    BRONCHITIS  . Bacterial infection, unspecified    patient stated that it is unknown origin  . Cataract    First Dx in 2012  . Chronic combined systolic and diastolic congestive heart failure ([place])    a. 03/2018 Echo: EF 30-35%, ant, antlat, apical AK, Gr1 DD; b. 07/2018 Echo: EF 35-40%, anteroseptal, apical, and ant HK. Gr1 DD.  [name] Coronary artery disease    a. Prior Ant MI->s/p multiple stents placed in the LAD and right coronary artery ([place]); b. 2016 Cath: reportedly nonobs dzs;  c. 02/2017 MV: EF 45-54%, ap/ant, ap/inf, apical infarct, no ishcemia; c. 04/2018 Cath/PCI: LM nl, LAD 20p, patent mid stent, LCX 75m(3.25x15 [place] DES), OM1 nl, OM2 50, OM3 40 w/ patent stent, RCA 40p, 24m, 40d w/ patent stent in RPDA, RPAV 60, EF 25-35%. 2+MR.  . Diabetes mellitus without complication ([place])    TYPE 2  . Diabetic peripheral neuropathy (H[facility])    feet and hands  . FUO (fever of unknown origin) 08/03/2018  . GERD (gastroesophageal reflux disease)   . Headache    muscle tension  . Hyperlipidemia   . Hypertension    CONTROLLED ON MEDS  . Insomnia   . Ischemic cardiomyopathy    a. 03/2018 Echo: EF 30-35%, ant, antlat, apical AK, Gr1 DD, mild MR, mildly dil LA; b. 07/2018 Echo:  EF 35-40%.  . Lyme disease    Chronic  . Myasthenia gravis ([place])   . Myocardial infarction ([place]) 2010  . Seasonal allergies   . Sleep apnea    CPAP   Past Surgical History:  Procedure Laterality Date  . BILATERAL CARPAL TUNNEL RELEASE Bilateral L in 2012 and R in 2013  . CARDIAC CATHETERIZATION     Several Caths, most recent in  March 2016.  [name] COLONOSCOPY WITH PROPOFOL N/A 01/10/2016   Procedure: COLONOSCOPY WITH PROPOFOL;  Surgeon: [name], MD;  Location: [facility];  Service: Endoscopy;  Laterality: N/A;  . CORONARY ANGIOPLASTY    . CORONARY STENT INTERVENTION N/A 04/25/2018   Procedure: CORONARY STENT INTERVENTION;  Surgeon: [name], MD;  Location: [place] [facility];  Service: Cardiovascular;  Laterality: N/A;  . ESOPHAGOGASTRODUODENOSCOPY (EGD) WITH PROPOFOL N/A 01/10/2016   Procedure: ESOPHAGOGASTRODUODENOSCOPY (EGD) WITH PROPOFOL;  Surgeon: [name], MD;  Location: [facility];  Service: Endoscopy;  Laterality: N/A;  . EYE SURGERY Bilateral 2012   cataract/bilateral vitrectomies  . LEFT HEART CATH AND CORONARY ANGIOGRAPHY Left 04/25/2018   Procedure: LEFT HEART CATH AND CORONARY ANGIOGRAPHY;  Surgeon: [name], MD;  Location: [place] [facility];  Service: Cardiovascular;  Laterality: Left;  . TONSILLECTOMY AND ADENOIDECTOMY     As a child  . TUNNELED VENOUS CATHETER PLACEMENT     removed  Allergies  Allergies  Allergen Reactions  . Metoprolol Rash  . Novolog [Insulin Aspart] Hives    History of Present Illness    57 year old male with above complex past medical history including CAD status post prior myocardial infarction in 2009 with subsequent placement of a total of 10 stents between the LAD and right coronary artery in [place].  Other history includes hypertension, hyperlipidemia, type 2 diabetes mellitus, sinus tachycardia, diabetic neuropathy, myasthenia gravis, GERD, and sleep apnea on CPAP.  In June 2019, he reported some  progression of baseline level of dyspnea on exertion and nitrate responsive angina at rest.  Echocardiogram showed an EF of 30 to 35%, which was a new decrease in LV function.  Catheterization was undertaken and showed a new 90% stenosis in the mid left circumflex with multiple patent LAD, RCA, and OM 2 stents.  The left circumflex was successfully treated with a drug-eluting stent.  LVEDP was severely elevated at the time of his catheterization and he was placed on Lasix and Entresto.  He did well post procedure and for the remainder of the summer but in October, he was admitted with disorientation and worsening fever and vomiting.  Lactate was elevated at 2.5 and subsequently rose to 3.3.  During admission, he did not have any chest pain or dyspnea but troponin rose to 11.48.  Follow-up echocardiogram showed persistent LV dysfunction with an EF of 30 to 35%.  We did see him during that admission and recommended consideration for repeat catheterization following full recovery.  Unfortunately, he required readmission in mid October with recurrent fever and chills.  Lactate was again elevated at 13.1.  Procalcitonin 0.51.  Troponin was mildly elevated at 0.072.  Chest x-ray was nonacute and WBCs were normal.  He was placed on antibiotics and seen by infectious disease and managed for fever of unknown origin.  He was subsequently discharged October 20, and has since undergone MRI of the brain, which did not show any acute intracranial abnormalities or source of infection.  He is pending an MRI of his spine on November 21.  He will follow-up with infectious disease after that.  From a cardiac standpoint, he did have approximately 4 episodes of exertional chest discomfort immediately following hospital discharge in late October but over the past 2 weeks, he says he has been chest pain-free.  He has been steadily increasing activity and has not had any fevers since his last admission.  He has chronic mild dyspnea on  exertion which is unchanged but denies PND, orthopnea, palpitations, syncope, or early satiety.  He occasionally notes mild ankle swelling if he has been sitting for long periods of time.  He has had an earache and is currently being treated with antibiotic eardrops for an external ear infection.  He thinks this is contributed to vertiginous symptoms that occur when he turns his head quickly.  He has follow-up with ENT pending.  Home Medications    Prior to Admission medications   Medication Sig Start Date End Date Taking? Authorizing Provider  albuterol (PROVENTIL HFA;VENTOLIN HFA) 108 (90 Base) MCG/ACT inhaler Inhale 2 puffs into the lungs every 6 (six) hours as needed for wheezing or shortness of breath.   Yes [provider]  albuterol (PROVENTIL) (2.5 MG/3ML) 0.083% nebulizer solution Take 2.5 mg by nebulization every 6 (six) hours as needed for wheezing or shortness of breath.   Yes [provider]  aspirin EC 81 MG tablet Take 1 tablet (81 mg total) by mouth daily.  03/28/18  Yes Theora Gianotti, NP  BREO ELLIPTA 200-25 MCG/INH AEPB INHALE 1 PUFF INTO THE LUNGS DAILY Patient taking differently: Inhale 1 puff into the lungs daily.  06/08/18  Yes Poulose, Bethel Born, NP  carvedilol (COREG) 6.25 MG tablet Take 1 tablet (6.25 mg total) by mouth 2 (two) times daily. 05/12/18  Yes [name], MD  ciprofloxacin-hydrocortisone (CIPRO HC) OTIC suspension Place 3 drops into both ears 2 (two) times daily. 08/12/18  Yes [name], MD  clopidogrel (PLAVIX) 75 MG tablet Take 1 tablet (75 mg total) by mouth daily with breakfast. 04/27/18  Yes Theora Gianotti, NP  cyanocobalamin 1000 MCG tablet Take 1,000 mcg by mouth daily.    Yes [provider]  dexlansoprazole (DEXILANT) 60 MG capsule Take 1 capsule (60 mg total) by mouth daily. 06/29/18  Yes [name], MD  ezetimibe (ZETIA) 10 MG tablet Take 1 tablet (10 mg total) by mouth daily. 04/11/18 08/18/18 Yes  Theora Gianotti, NP  gabapentin (NEURONTIN) 300 MG capsule Take 1 capsule (300 mg total) by mouth 3 (three) times daily. 09/25/15  Yes Rochel Brome A, MD  insulin lispro (HUMALOG KWIKPEN) 100 UNIT/ML KiwkPen Inject 7-8 Units into the skin 3 (three) times daily. Reported on 12/02/2015/ sliding scale 1 unit for every 8 units of carbs; and 1 unit for every 20 above 120   Yes [provider]  Insulin Pen Needle (FIFTY50 PEN NEEDLES) 32G X 4 MM MISC Inject 1 each into the skin as needed. 02/24/18  Yes [provider]  LANTUS SOLOSTAR 100 UNIT/ML Solostar Pen Inject 25-30 Units into the skin daily at 10 pm.  03/25/16  Yes Sherri Sear, MD  levocetirizine (XYZAL) 5 MG tablet Take 1 tablet (5 mg total) by mouth every evening. 06/07/18  Yes Poulose, Bethel Born, NP  liraglutide (VICTOZA) 18 MG/3ML SOPN 1.8 units every morning Patient taking differently: Inject 1.8 mg into the skin daily.  04/26/18  Yes Theora Gianotti, NP  loratadine (CLARITIN) 10 MG tablet Take 10 mg by mouth daily.    Yes [provider]  metFORMIN (GLUCOPHAGE) 1000 MG tablet Take 1,000 mg by mouth 2 (two) times daily with a meal.   Yes Sherri Sear, MD  naloxone [facility]) nasal spray 4 mg/0.1 mL For excess sedation from opioids 09/15/16  Yes Molli Barrows, MD  nitroGLYCERIN (NITROSTAT) 0.3 MG SL tablet Place 1 tablet (0.3 mg total) under the tongue every 5 (five) minutes as needed for chest pain. Maximum 3 doses. 08/18/18  Yes Theora Gianotti, NP  rosuvastatin (CRESTOR) 5 MG tablet Take 5 mg by mouth daily. 11/20/17  Yes [name], MD  sacubitril-valsartan (ENTRESTO) 24-26 MG Take 1 tablet by mouth 2 (two) times daily. 04/26/18  Yes Theora Gianotti, NP    Review of Systems    Following recent discharge, he did have some chest discomfort but none in the past 2 weeks.  He occasionally notes ankle edema if sitting for long periods of time.  He has had an  earache and is being treated for an external ear infection.  He has also had some vertiginous symptoms and is pending ENT evaluation.  He has not had any fevers.  He has chronic stable dyspnea.  He denies PND, palpitations, orthopnea, syncope, or early satiety..  All other systems reviewed and are otherwise negative except as noted above.  Physical Exam    VS:  BP 124/72 (BP Location: Left Arm,  Patient Position: Sitting, Cuff Size: Normal)   Pulse (!) 108   Ht 5\' 7"  (1.702 m)   Wt 232 lb (105.2 kg)   BMI 36.34 kg/m  , BMI Body mass index is 36.34 kg/m. GEN: Well nourished, well developed, in no acute distress. HEENT: normal. Neck: Supple, no JVD, carotid bruits, or masses. Cardiac: RRR, no murmurs, rubs, or gallops. No clubbing, cyanosis, edema.  Radials/DP/PT 2+ and equal bilaterally.  Respiratory:  Respirations regular and unlabored, clear to auscultation bilaterally. GI: Soft, nontender, nondistended, BS + x 4. MS: no deformity or atrophy. Skin: warm and dry, no rash. Neuro:  Strength and sensation are intact. Psych: Normal affect.  Accessory Clinical Findings    ECG personally reviewed by me today -sinus tachycardia, 108, prior inferior and anterolateral infarcts.  No acute changes.- no acute changes.  Assessment & Plan    1.  Coronary artery disease/non-STEMI, subsequent visit: Patient with a long history of CAD, most recently status post PCI and drug-eluting stent placement to left circumflex in June after he was found to have new LV dysfunction.  During recent admission for fever of unknown origin and sepsis, he ruled in for non-STEMI with a troponin of 11.48.  He did not have chest pain or dyspnea at that time.  We felt that he would require diagnostic catheterization once he fully recovered from febrile illness.  He was readmitted a week later with recurrent fever and chills.  He has follow-up planned with infectious disease later this month as well as follow-up imaging with MRI  of the spine November 21 to assess for abscess.  He has not been having any chest pain over the past 2 weeks though he did have some nitrate responsive chest pain immediately following recent hospitalization.  He has been steadily increasing activity.  As he has been doing well and his infectious disease work-up is not not yet complete, we will defer diagnostic catheterization at this time.  We will plan to see him back in approximately 3 weeks and provided that he remains afebrile and additional infectious disease work-up remains negative, we can reconsider diagnostic catheterization at that time.  He remains on aspirin, beta-blocker, and statin therapy.  2.  HFrEF/ischemic cardiomyopathy: Volume stable on examination today.  He has chronic elevation in heart rate despite beta-blocker therapy.  He remains on Entresto.  3.  Essential hypertension: Stable.  4.  Hyperlipidemia: LDL 62 after adding Zetia in April.  Continue rosuvastatin and Zetia.  5.  Type 2 diabetes mellitus: A1c 7.9 and August.  On multiple medications and managed by primary care.  We will need to consider an SGLT 2 inhibitor in the future.  6.  Sinus tachycardia: Chronic/persistent despite beta-blocker therapy and regardless of LV function.  Etiology unclear.  7.  Fevers of unknown origin: Being followed by infectious disease.  See #1.  8.  Disposition: Follow-up in 3 to 4 weeks to reevaluate infectious disease work-up and reconsider diagnostic catheterization.  Murray Hodgkins, NP 08/18/2018, 6:28 PM

## 2018-08-18 NOTE — H&P (View-Only) (Signed)
Office Visit    Patient Name: [name] Date of Encounter: 08/18/2018  Primary Care Provider:  [name], MD Primary Cardiologist:  [name], MD  Chief Complaint    57 year old male with a history of CAD status post multiple PCI's, hypertension, hyperlipidemia, type 2 diabetes mellitus, diabetic neuropathy, myasthenia gravis, and obstructive sleep apnea, who presents for follow-up after recent hospitalizations related to sepsis and fever of unknown origin complicated by non-STEMI.  Past Medical History    Past Medical History:  Diagnosis Date  . Allergy    dust, seasonal (worse in the fall).  . 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 Followed by Pain Specialist in [place], back and neck  . Asthma    BRONCHITIS  . Bacterial infection, unspecified    patient stated that it is unknown origin  . Cataract    First Dx in 2012  . Chronic combined systolic and diastolic congestive heart failure ([place])    a. 03/2018 Echo: EF 30-35%, ant, antlat, apical AK, Gr1 DD; b. 07/2018 Echo: EF 35-40%, anteroseptal, apical, and ant HK. Gr1 DD.  [name] Coronary artery disease    a. Prior Ant MI->s/p multiple stents placed in the LAD and right coronary artery ([place]); b. 2016 Cath: reportedly nonobs dzs;  c. 02/2017 MV: EF 45-54%, ap/ant, ap/inf, apical infarct, no ishcemia; c. 04/2018 Cath/PCI: LM nl, LAD 20p, patent mid stent, LCX 13m(3.25x15 [place] DES), OM1 nl, OM2 50, OM3 40 w/ patent stent, RCA 40p, 67m, 40d w/ patent stent in RPDA, RPAV 60, EF 25-35%. 2+MR.  . Diabetes mellitus without complication ([place])    TYPE 2  . Diabetic peripheral neuropathy (HCC)    feet and hands  . FUO (fever of unknown origin) 08/03/2018  . GERD (gastroesophageal reflux disease)   . Headache    muscle tension  . Hyperlipidemia   . Hypertension    CONTROLLED ON MEDS  . Insomnia   . Ischemic cardiomyopathy    a. 03/2018 Echo: EF 30-35%, ant, antlat, apical AK, Gr1 DD, mild MR, mildly dil LA; b. 07/2018 Echo:  EF 35-40%.  . Lyme disease    Chronic  . Myasthenia gravis ([place])   . Myocardial infarction ([place]) 2010  . Seasonal allergies   . Sleep apnea    CPAP   Past Surgical History:  Procedure Laterality Date  . BILATERAL CARPAL TUNNEL RELEASE Bilateral L in 2012 and R in 2013  . CARDIAC CATHETERIZATION     Several Caths, most recent in  March 2016.  [name] COLONOSCOPY WITH PROPOFOL N/A 01/10/2016   Procedure: COLONOSCOPY WITH PROPOFOL;  Surgeon: [name], MD;  Location: [facility];  Service: Endoscopy;  Laterality: N/A;  . CORONARY ANGIOPLASTY    . CORONARY STENT INTERVENTION N/A 04/25/2018   Procedure: CORONARY STENT INTERVENTION;  Surgeon: [name], MD;  Location: [facility];  Service: Cardiovascular;  Laterality: N/A;  . ESOPHAGOGASTRODUODENOSCOPY (EGD) WITH PROPOFOL N/A 01/10/2016   Procedure: ESOPHAGOGASTRODUODENOSCOPY (EGD) WITH PROPOFOL;  Surgeon: [name], MD;  Location: [facility];  Service: Endoscopy;  Laterality: N/A;  . EYE SURGERY Bilateral 2012   cataract/bilateral vitrectomies  . LEFT HEART CATH AND CORONARY ANGIOGRAPHY Left 04/25/2018   Procedure: LEFT HEART CATH AND CORONARY ANGIOGRAPHY;  Surgeon: [name], MD;  Location: [facility];  Service: Cardiovascular;  Laterality: Left;  . TONSILLECTOMY AND ADENOIDECTOMY     As a child  . TUNNELED VENOUS CATHETER PLACEMENT     removed  Allergies  Allergies  Allergen Reactions  . Metoprolol Rash  . Novolog [Insulin Aspart] Hives    History of Present Illness    57 year old male with above complex past medical history including CAD status post prior myocardial infarction in 2009 with subsequent placement of a total of 10 stents between the LAD and right coronary artery in [place].  Other history includes hypertension, hyperlipidemia, type 2 diabetes mellitus, sinus tachycardia, diabetic neuropathy, myasthenia gravis, GERD, and sleep apnea on CPAP.  In June 2019, he reported some  progression of baseline level of dyspnea on exertion and nitrate responsive angina at rest.  Echocardiogram showed an EF of 30 to 35%, which was a new decrease in LV function.  Catheterization was undertaken and showed a new 90% stenosis in the mid left circumflex with multiple patent LAD, RCA, and OM 2 stents.  The left circumflex was successfully treated with a drug-eluting stent.  LVEDP was severely elevated at the time of his catheterization and he was placed on Lasix and Entresto.  He did well post procedure and for the remainder of the summer but in October, he was admitted with disorientation and worsening fever and vomiting.  Lactate was elevated at 2.5 and subsequently rose to 3.3.  During admission, he did not have any chest pain or dyspnea but troponin rose to 11.48.  Follow-up echocardiogram showed persistent LV dysfunction with an EF of 30 to 35%.  We did see him during that admission and recommended consideration for repeat catheterization following full recovery.  Unfortunately, he required readmission in mid October with recurrent fever and chills.  Lactate was again elevated at 13.1.  Procalcitonin 0.51.  Troponin was mildly elevated at 0.072.  Chest x-ray was nonacute and WBCs were normal.  He was placed on antibiotics and seen by infectious disease and managed for fever of unknown origin.  He was subsequently discharged October 20, and has since undergone MRI of the brain, which did not show any acute intracranial abnormalities or source of infection.  He is pending an MRI of his spine on November 21.  He will follow-up with infectious disease after that.  From a cardiac standpoint, he did have approximately 4 episodes of exertional chest discomfort immediately following hospital discharge in late October but over the past 2 weeks, he says he has been chest pain-free.  He has been steadily increasing activity and has not had any fevers since his last admission.  He has chronic mild dyspnea on  exertion which is unchanged but denies PND, orthopnea, palpitations, syncope, or early satiety.  He occasionally notes mild ankle swelling if he has been sitting for long periods of time.  He has had an earache and is currently being treated with antibiotic eardrops for an external ear infection.  He thinks this is contributed to vertiginous symptoms that occur when he turns his head quickly.  He has follow-up with ENT pending.  Home Medications    Prior to Admission medications   Medication Sig Start Date End Date Taking? Authorizing Provider  albuterol (PROVENTIL HFA;VENTOLIN HFA) 108 (90 Base) MCG/ACT inhaler Inhale 2 puffs into the lungs every 6 (six) hours as needed for wheezing or shortness of breath.   Yes [provider]  albuterol (PROVENTIL) (2.5 MG/3ML) 0.083% nebulizer solution Take 2.5 mg by nebulization every 6 (six) hours as needed for wheezing or shortness of breath.   Yes [provider]  aspirin EC 81 MG tablet Take 1 tablet (81 mg total) by mouth daily.  03/28/18  Yes Theora Gianotti, NP  BREO ELLIPTA 200-25 MCG/INH AEPB INHALE 1 PUFF INTO THE LUNGS DAILY Patient taking differently: Inhale 1 puff into the lungs daily.  06/08/18  Yes Poulose, Bethel Born, NP  carvedilol (COREG) 6.25 MG tablet Take 1 tablet (6.25 mg total) by mouth 2 (two) times daily. 05/12/18  Yes [name], MD  ciprofloxacin-hydrocortisone (CIPRO HC) OTIC suspension Place 3 drops into both ears 2 (two) times daily. 08/12/18  Yes [name], MD  clopidogrel (PLAVIX) 75 MG tablet Take 1 tablet (75 mg total) by mouth daily with breakfast. 04/27/18  Yes Theora Gianotti, NP  cyanocobalamin 1000 MCG tablet Take 1,000 mcg by mouth daily.    Yes [provider]  dexlansoprazole (DEXILANT) 60 MG capsule Take 1 capsule (60 mg total) by mouth daily. 06/29/18  Yes [name], MD  ezetimibe (ZETIA) 10 MG tablet Take 1 tablet (10 mg total) by mouth daily. 04/11/18 08/18/18 Yes  Theora Gianotti, NP  gabapentin (NEURONTIN) 300 MG capsule Take 1 capsule (300 mg total) by mouth 3 (three) times daily. 09/25/15  Yes Rochel Brome A, MD  insulin lispro (HUMALOG KWIKPEN) 100 UNIT/ML KiwkPen Inject 7-8 Units into the skin 3 (three) times daily. Reported on 12/02/2015/ sliding scale 1 unit for every 8 units of carbs; and 1 unit for every 20 above 120   Yes [provider]  Insulin Pen Needle (FIFTY50 PEN NEEDLES) 32G X 4 MM MISC Inject 1 each into the skin as needed. 02/24/18  Yes [provider]  LANTUS SOLOSTAR 100 UNIT/ML Solostar Pen Inject 25-30 Units into the skin daily at 10 pm.  03/25/16  Yes Sherri Sear, MD  levocetirizine (XYZAL) 5 MG tablet Take 1 tablet (5 mg total) by mouth every evening. 06/07/18  Yes Poulose, Bethel Born, NP  liraglutide (VICTOZA) 18 MG/3ML SOPN 1.8 units every morning Patient taking differently: Inject 1.8 mg into the skin daily.  04/26/18  Yes Theora Gianotti, NP  loratadine (CLARITIN) 10 MG tablet Take 10 mg by mouth daily.    Yes [provider]  metFORMIN (GLUCOPHAGE) 1000 MG tablet Take 1,000 mg by mouth 2 (two) times daily with a meal.   Yes Sherri Sear, MD  naloxone [facility]) nasal spray 4 mg/0.1 mL For excess sedation from opioids 09/15/16  Yes Molli Barrows, MD  nitroGLYCERIN (NITROSTAT) 0.3 MG SL tablet Place 1 tablet (0.3 mg total) under the tongue every 5 (five) minutes as needed for chest pain. Maximum 3 doses. 08/18/18  Yes Theora Gianotti, NP  rosuvastatin (CRESTOR) 5 MG tablet Take 5 mg by mouth daily. 11/20/17  Yes [name], MD  sacubitril-valsartan (ENTRESTO) 24-26 MG Take 1 tablet by mouth 2 (two) times daily. 04/26/18  Yes Theora Gianotti, NP    Review of Systems    Following recent discharge, he did have some chest discomfort but none in the past 2 weeks.  He occasionally notes ankle edema if sitting for long periods of time.  He has had an  earache and is being treated for an external ear infection.  He has also had some vertiginous symptoms and is pending ENT evaluation.  He has not had any fevers.  He has chronic stable dyspnea.  He denies PND, palpitations, orthopnea, syncope, or early satiety..  All other systems reviewed and are otherwise negative except as noted above.  Physical Exam    VS:  BP 124/72 (BP Location: Left Arm,  Patient Position: Sitting, Cuff Size: Normal)   Pulse (!) 108   Ht 5\' 7"  (1.702 m)   Wt 232 lb (105.2 kg)   BMI 36.34 kg/m  , BMI Body mass index is 36.34 kg/m. GEN: Well nourished, well developed, in no acute distress. HEENT: normal. Neck: Supple, no JVD, carotid bruits, or masses. Cardiac: RRR, no murmurs, rubs, or gallops. No clubbing, cyanosis, edema.  Radials/DP/PT 2+ and equal bilaterally.  Respiratory:  Respirations regular and unlabored, clear to auscultation bilaterally. GI: Soft, nontender, nondistended, BS + x 4. MS: no deformity or atrophy. Skin: warm and dry, no rash. Neuro:  Strength and sensation are intact. Psych: Normal affect.  Accessory Clinical Findings    ECG personally reviewed by me today -sinus tachycardia, 108, prior inferior and anterolateral infarcts.  No acute changes.- no acute changes.  Assessment & Plan    1.  Coronary artery disease/non-STEMI, subsequent visit: Patient with a long history of CAD, most recently status post PCI and drug-eluting stent placement to left circumflex in June after he was found to have new LV dysfunction.  During recent admission for fever of unknown origin and sepsis, he ruled in for non-STEMI with a troponin of 11.48.  He did not have chest pain or dyspnea at that time.  We felt that he would require diagnostic catheterization once he fully recovered from febrile illness.  He was readmitted a week later with recurrent fever and chills.  He has follow-up planned with infectious disease later this month as well as follow-up imaging with MRI  of the spine November 21 to assess for abscess.  He has not been having any chest pain over the past 2 weeks though he did have some nitrate responsive chest pain immediately following recent hospitalization.  He has been steadily increasing activity.  As he has been doing well and his infectious disease work-up is not not yet complete, we will defer diagnostic catheterization at this time.  We will plan to see him back in approximately 3 weeks and provided that he remains afebrile and additional infectious disease work-up remains negative, we can reconsider diagnostic catheterization at that time.  He remains on aspirin, beta-blocker, and statin therapy.  2.  HFrEF/ischemic cardiomyopathy: Volume stable on examination today.  He has chronic elevation in heart rate despite beta-blocker therapy.  He remains on Entresto.  3.  Essential hypertension: Stable.  4.  Hyperlipidemia: LDL 62 after adding Zetia in April.  Continue rosuvastatin and Zetia.  5.  Type 2 diabetes mellitus: A1c 7.9 and August.  On multiple medications and managed by primary care.  We will need to consider an SGLT 2 inhibitor in the future.  6.  Sinus tachycardia: Chronic/persistent despite beta-blocker therapy and regardless of LV function.  Etiology unclear.  7.  Fevers of unknown origin: Being followed by infectious disease.  See #1.  8.  Disposition: Follow-up in 3 to 4 weeks to reevaluate infectious disease work-up and reconsider diagnostic catheterization.  Murray Hodgkins, NP 08/18/2018, 6:28 PM

## 2018-08-18 NOTE — Patient Instructions (Signed)
Medication Instructions:  Your physician recommends that you continue on your current medications as directed. Please refer to the Current Medication list given to you today.  If you need a refill on your cardiac medications before your next appointment, please call your pharmacy.   Lab work: none If you have labs (blood work) drawn today and your tests are completely normal, you will receive your results only by: [name] MyChart Message (if you have MyChart) OR . A paper copy in the mail If you have any lab test that is abnormal or we need to change your treatment, we will call you to review the results.  Testing/Procedures: none  Follow-Up: At [facility], you and your health needs are our priority.  As part of our continuing mission to provide you with exceptional heart care, we have created designated Provider Care Teams.  These Care Teams include your primary Cardiologist (physician) and Advanced Practice Providers (APPs -  Physician Assistants and Nurse Practitioners) who all work together to provide you with the care you need, when you need it. You will need a follow up appointment in 3 weeks. You may see [name], MD or Murray Hodgkins, NP on a day that Dr Fletcher Anon is in the office.

## 2018-08-19 ENCOUNTER — Other Ambulatory Visit: Payer: Self-pay | Admitting: Physician Assistant

## 2018-08-19 ENCOUNTER — Telehealth: Payer: Self-pay | Admitting: *Deleted

## 2018-08-19 DIAGNOSIS — I10 Essential (primary) hypertension: Secondary | ICD-10-CM | POA: Diagnosis not present

## 2018-08-19 DIAGNOSIS — E119 Type 2 diabetes mellitus without complications: Secondary | ICD-10-CM | POA: Diagnosis not present

## 2018-08-19 DIAGNOSIS — J4520 Mild intermittent asthma, uncomplicated: Secondary | ICD-10-CM | POA: Diagnosis not present

## 2018-08-19 DIAGNOSIS — H905 Unspecified sensorineural hearing loss: Secondary | ICD-10-CM | POA: Diagnosis not present

## 2018-08-19 DIAGNOSIS — H90A22 Sensorineural hearing loss, unilateral, left ear, with restricted hearing on the contralateral side: Secondary | ICD-10-CM | POA: Diagnosis not present

## 2018-08-19 DIAGNOSIS — IMO0001 Reserved for inherently not codable concepts without codable children: Secondary | ICD-10-CM

## 2018-08-19 DIAGNOSIS — R0609 Other forms of dyspnea: Secondary | ICD-10-CM | POA: Diagnosis not present

## 2018-08-19 DIAGNOSIS — H9042 Sensorineural hearing loss, unilateral, left ear, with unrestricted hearing on the contralateral side: Secondary | ICD-10-CM

## 2018-08-19 DIAGNOSIS — G4733 Obstructive sleep apnea (adult) (pediatric): Secondary | ICD-10-CM | POA: Diagnosis not present

## 2018-08-19 DIAGNOSIS — R42 Dizziness and giddiness: Secondary | ICD-10-CM | POA: Diagnosis not present

## 2018-08-19 NOTE — Telephone Encounter (Signed)
-----   Message from Theora Gianotti, NP sent at 08/19/2018  7:45 AM EST ----- [name] PCP reached out to me to let me know that [facility] would like [name] to have a TEE.  Could you pls reach out to him and arrange for this to be done w/ Dr. Fletcher Anon w/in the next few wks - preferably before Tgiving.  Just let me know once it's scheduled and I can place orders.  Thanks,  Gerald Stabs

## 2018-08-22 ENCOUNTER — Encounter: Payer: Self-pay | Admitting: Family Medicine

## 2018-08-22 NOTE — Telephone Encounter (Signed)
Has anyone talked with [name] yet to get him set up for TEE?  Would prefer to have this done prior to his next visit w/ Dr. Fletcher Anon in Dec. Thanks.

## 2018-08-23 ENCOUNTER — Encounter

## 2018-08-23 ENCOUNTER — Telehealth: Payer: Self-pay | Admitting: Behavioral Health

## 2018-08-23 ENCOUNTER — Inpatient Hospital Stay: Payer: Self-pay | Admitting: Family Medicine

## 2018-08-23 DIAGNOSIS — N189 Chronic kidney disease, unspecified: Secondary | ICD-10-CM | POA: Diagnosis not present

## 2018-08-23 DIAGNOSIS — E785 Hyperlipidemia, unspecified: Secondary | ICD-10-CM | POA: Diagnosis not present

## 2018-08-23 DIAGNOSIS — M4697 Unspecified inflammatory spondylopathy, lumbosacral region: Secondary | ICD-10-CM | POA: Diagnosis not present

## 2018-08-23 DIAGNOSIS — E1142 Type 2 diabetes mellitus with diabetic polyneuropathy: Secondary | ICD-10-CM | POA: Diagnosis not present

## 2018-08-23 DIAGNOSIS — Z794 Long term (current) use of insulin: Secondary | ICD-10-CM | POA: Diagnosis not present

## 2018-08-23 DIAGNOSIS — A6923 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: Secondary | ICD-10-CM | POA: Diagnosis not present

## 2018-08-23 DIAGNOSIS — I2510 Atherosclerotic heart disease of native coronary artery without angina pectoris: Secondary | ICD-10-CM | POA: Diagnosis not present

## 2018-08-23 DIAGNOSIS — A419 Sepsis, unspecified organism: Secondary | ICD-10-CM | POA: Diagnosis not present

## 2018-08-23 DIAGNOSIS — G4733 Obstructive sleep apnea (adult) (pediatric): Secondary | ICD-10-CM | POA: Diagnosis not present

## 2018-08-23 DIAGNOSIS — R6520 Severe sepsis without septic shock: Secondary | ICD-10-CM | POA: Diagnosis not present

## 2018-08-23 DIAGNOSIS — E1122 Type 2 diabetes mellitus with diabetic chronic kidney disease: Secondary | ICD-10-CM | POA: Diagnosis not present

## 2018-08-23 DIAGNOSIS — I130 Hypertensive heart and chronic kidney disease with heart failure and stage 1 through stage 4 chronic kidney disease, or unspecified chronic kidney disease: Secondary | ICD-10-CM | POA: Diagnosis not present

## 2018-08-23 DIAGNOSIS — I5042 Chronic combined systolic (congestive) and diastolic (congestive) heart failure: Secondary | ICD-10-CM | POA: Diagnosis not present

## 2018-08-23 DIAGNOSIS — G7000 Myasthenia gravis without (acute) exacerbation: Secondary | ICD-10-CM | POA: Diagnosis not present

## 2018-08-23 DIAGNOSIS — J4540 Moderate persistent asthma, uncomplicated: Secondary | ICD-10-CM | POA: Diagnosis not present

## 2018-08-23 DIAGNOSIS — K219 Gastro-esophageal reflux disease without esophagitis: Secondary | ICD-10-CM | POA: Diagnosis not present

## 2018-08-23 NOTE — Telephone Encounter (Signed)
Patient called to update Dr. Tommy Medal.  Patient states he has an upcoming MRI of the brain and ears.  Patient would like to know when he should follow up with Dr. Tommy Medal and he saw Brett Fairy PA at [facility]. Pricilla Riffle RN

## 2018-08-23 NOTE — Telephone Encounter (Signed)
Patient calling stating he did receive our message to schedule the TEE but only thing he will be out of town all next week  Won't be back in town until late 11/29 He is okay with any day before seeing Dr Fletcher Anon to have the TEE done  Please call back

## 2018-08-24 ENCOUNTER — Other Ambulatory Visit: Payer: Self-pay | Admitting: Nurse Practitioner

## 2018-08-24 DIAGNOSIS — A419 Sepsis, unspecified organism: Secondary | ICD-10-CM | POA: Diagnosis not present

## 2018-08-24 DIAGNOSIS — E1142 Type 2 diabetes mellitus with diabetic polyneuropathy: Secondary | ICD-10-CM | POA: Diagnosis not present

## 2018-08-24 DIAGNOSIS — G7000 Myasthenia gravis without (acute) exacerbation: Secondary | ICD-10-CM | POA: Diagnosis not present

## 2018-08-24 DIAGNOSIS — K219 Gastro-esophageal reflux disease without esophagitis: Secondary | ICD-10-CM | POA: Diagnosis not present

## 2018-08-24 DIAGNOSIS — R6520 Severe sepsis without septic shock: Secondary | ICD-10-CM | POA: Diagnosis not present

## 2018-08-24 DIAGNOSIS — J4540 Moderate persistent asthma, uncomplicated: Secondary | ICD-10-CM | POA: Diagnosis not present

## 2018-08-24 DIAGNOSIS — I2510 Atherosclerotic heart disease of native coronary artery without angina pectoris: Secondary | ICD-10-CM | POA: Diagnosis not present

## 2018-08-24 DIAGNOSIS — G4733 Obstructive sleep apnea (adult) (pediatric): Secondary | ICD-10-CM | POA: Diagnosis not present

## 2018-08-24 DIAGNOSIS — I5042 Chronic combined systolic (congestive) and diastolic (congestive) heart failure: Secondary | ICD-10-CM | POA: Diagnosis not present

## 2018-08-24 DIAGNOSIS — Z794 Long term (current) use of insulin: Secondary | ICD-10-CM | POA: Diagnosis not present

## 2018-08-24 DIAGNOSIS — E785 Hyperlipidemia, unspecified: Secondary | ICD-10-CM | POA: Diagnosis not present

## 2018-08-24 DIAGNOSIS — A6923 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: Secondary | ICD-10-CM | POA: Diagnosis not present

## 2018-08-24 DIAGNOSIS — I130 Hypertensive heart and chronic kidney disease with heart failure and stage 1 through stage 4 chronic kidney disease, or unspecified chronic kidney disease: Secondary | ICD-10-CM | POA: Diagnosis not present

## 2018-08-24 DIAGNOSIS — E1122 Type 2 diabetes mellitus with diabetic chronic kidney disease: Secondary | ICD-10-CM | POA: Diagnosis not present

## 2018-08-24 DIAGNOSIS — N189 Chronic kidney disease, unspecified: Secondary | ICD-10-CM | POA: Diagnosis not present

## 2018-08-24 DIAGNOSIS — M4697 Unspecified inflammatory spondylopathy, lumbosacral region: Secondary | ICD-10-CM | POA: Diagnosis not present

## 2018-08-24 NOTE — Telephone Encounter (Signed)
I called and spoke with scheduling at [facility]. The patient is scheduled for a TEE on 09/05/18 with Dr. Fletcher Anon at 8:30 am.   You are scheduled for a TEE on Monday 09/05/18 with Dr. Please arrive at the [place] [facility] at 7:30 a.m. on the day of your procedure.  DIET INSTRUCTIONS:  Nothing to eat or drink after midnight except your medications with a sip of water.         1) Labs: N/A  2) Medications:  You may take your regular medications the morning of your procedure with enough water to get them down safely unless listed below:  - hold all diabetic medications/ insulin the morning of your procedure  - if you take any night time insulin, please take only 1/2 of your usual dose the night prior  3) Must have a responsible person to drive you home.  4) Bring a current list of your medications and current insurance cards.    If you have any questions after you get home, please call the office at [phone]   I have called and spoken with the patient.  He is aware of the appointment date/ time for his TEE- verbal instructions given for the procedure and the patient voices understanding.   Copy of instructions also sent to the patient via [place].

## 2018-08-24 NOTE — Telephone Encounter (Signed)
Caryl Pina could you do 2 things for me:  1 I thought that this patient was scheduled with me at the end of November but I do not see it on his appointments may need to work him in with me may be next Monday or Wednesday  2.  Can you call that office at [facility] regional have the provider give me a call my cell at [phone].  Finally when is his MRI of his ear scheduled I do not see that I see another MRI of the brain scheduled in early December along with the MRI of the spine coming tomorrow

## 2018-08-24 NOTE — Progress Notes (Signed)
    [facility] has been requested to perform a transesophageal echocardiogram on [name] for recurrent sepsis/r/o SBE.  After careful review of history and examination, the risks and benefits of transesophageal echocardiogram have been explained including risks of esophageal damage, perforation (1:10,000 risk), bleeding, pharyngeal hematoma as well as other potential complications associated with conscious sedation including aspiration, arrhythmia, respiratory failure and death. Alternatives to treatment were discussed, questions were answered. Patient is willing to proceed.   Murray Hodgkins, NP  08/24/2018 4:56 PM

## 2018-08-25 ENCOUNTER — Ambulatory Visit
Admission: RE | Admit: 2018-08-25 | Discharge: 2018-08-25 | Disposition: A | Discharge status: Home / Self Care | Payer: Medicare Other | Source: Ambulatory Visit | Attending: Infectious Disease | Admitting: Infectious Disease

## 2018-08-25 DIAGNOSIS — R509 Fever, unspecified: Secondary | ICD-10-CM | POA: Diagnosis not present

## 2018-08-25 DIAGNOSIS — M47816 Spondylosis without myelopathy or radiculopathy, lumbar region: Secondary | ICD-10-CM | POA: Diagnosis not present

## 2018-08-25 DIAGNOSIS — M48061 Spinal stenosis, lumbar region without neurogenic claudication: Secondary | ICD-10-CM | POA: Insufficient documentation

## 2018-08-25 MED ORDER — GADOBUTROL 1 MMOL/ML IV SOLN
10.0000 mL | Freq: Once | INTRAVENOUS | Status: AC | PRN
  Administered 2018-08-25: 10 mL via INTRAVENOUS

## 2018-08-25 NOTE — Telephone Encounter (Signed)
Called Mr. Hilbert to set up an appointment with Dr. Tommy Medal and Dr. Tommy Medal ws able to speak with Brett Fairy PA.   Pricilla Riffle RN

## 2018-08-25 NOTE — Telephone Encounter (Signed)
Affect

## 2018-08-29 DIAGNOSIS — G7000 Myasthenia gravis without (acute) exacerbation: Secondary | ICD-10-CM | POA: Diagnosis not present

## 2018-08-31 ENCOUNTER — Ambulatory Visit: Payer: Medicare Other | Admitting: Infectious Disease

## 2018-09-05 ENCOUNTER — Other Ambulatory Visit: Payer: Self-pay

## 2018-09-05 ENCOUNTER — Ambulatory Visit: Payer: Medicare Other | Admitting: Infectious Disease

## 2018-09-05 ENCOUNTER — Encounter
Admission: RE | Disposition: A | Discharge status: Home / Self Care | Payer: Self-pay | Source: Ambulatory Visit | Attending: Cardiovascular Disease

## 2018-09-05 ENCOUNTER — Ambulatory Visit
Admission: RE | Admit: 2018-09-05 | Discharge: 2018-09-05 | Disposition: A | Discharge status: Home / Self Care | Payer: Medicare Other | Source: Ambulatory Visit | Attending: Cardiovascular Disease | Admitting: Cardiovascular Disease

## 2018-09-05 ENCOUNTER — Ambulatory Visit (HOSPITAL_BASED_OUTPATIENT_CLINIC_OR_DEPARTMENT_OTHER)
Admission: RE | Admit: 2018-09-05 | Discharge: 2018-09-05 | Disposition: A | Discharge status: Home / Self Care | Payer: Medicare Other | Source: Ambulatory Visit | Attending: Nurse Practitioner | Admitting: Nurse Practitioner

## 2018-09-05 DIAGNOSIS — I5042 Chronic combined systolic (congestive) and diastolic (congestive) heart failure: Secondary | ICD-10-CM | POA: Diagnosis not present

## 2018-09-05 DIAGNOSIS — K219 Gastro-esophageal reflux disease without esophagitis: Secondary | ICD-10-CM | POA: Diagnosis not present

## 2018-09-05 DIAGNOSIS — R000 Tachycardia, unspecified: Secondary | ICD-10-CM | POA: Diagnosis not present

## 2018-09-05 DIAGNOSIS — R509 Fever, unspecified: Secondary | ICD-10-CM | POA: Insufficient documentation

## 2018-09-05 DIAGNOSIS — Z794 Long term (current) use of insulin: Secondary | ICD-10-CM | POA: Insufficient documentation

## 2018-09-05 DIAGNOSIS — I339 Acute and subacute endocarditis, unspecified: Secondary | ICD-10-CM

## 2018-09-05 DIAGNOSIS — M1990 Unspecified osteoarthritis, unspecified site: Secondary | ICD-10-CM | POA: Diagnosis not present

## 2018-09-05 DIAGNOSIS — I252 Old myocardial infarction: Secondary | ICD-10-CM | POA: Diagnosis not present

## 2018-09-05 DIAGNOSIS — Z7902 Long term (current) use of antithrombotics/antiplatelets: Secondary | ICD-10-CM | POA: Diagnosis not present

## 2018-09-05 DIAGNOSIS — G4733 Obstructive sleep apnea (adult) (pediatric): Secondary | ICD-10-CM | POA: Insufficient documentation

## 2018-09-05 DIAGNOSIS — E785 Hyperlipidemia, unspecified: Secondary | ICD-10-CM | POA: Insufficient documentation

## 2018-09-05 DIAGNOSIS — E1142 Type 2 diabetes mellitus with diabetic polyneuropathy: Secondary | ICD-10-CM | POA: Insufficient documentation

## 2018-09-05 DIAGNOSIS — J45909 Unspecified asthma, uncomplicated: Secondary | ICD-10-CM | POA: Insufficient documentation

## 2018-09-05 DIAGNOSIS — Z7982 Long term (current) use of aspirin: Secondary | ICD-10-CM | POA: Insufficient documentation

## 2018-09-05 DIAGNOSIS — I255 Ischemic cardiomyopathy: Secondary | ICD-10-CM | POA: Insufficient documentation

## 2018-09-05 DIAGNOSIS — I110 Hypertensive heart disease with heart failure: Secondary | ICD-10-CM | POA: Insufficient documentation

## 2018-09-05 DIAGNOSIS — I2510 Atherosclerotic heart disease of native coronary artery without angina pectoris: Secondary | ICD-10-CM | POA: Insufficient documentation

## 2018-09-05 DIAGNOSIS — Z955 Presence of coronary angioplasty implant and graft: Secondary | ICD-10-CM | POA: Insufficient documentation

## 2018-09-05 DIAGNOSIS — G4700 Insomnia, unspecified: Secondary | ICD-10-CM | POA: Diagnosis not present

## 2018-09-05 DIAGNOSIS — I330 Acute and subacute infective endocarditis: Secondary | ICD-10-CM | POA: Insufficient documentation

## 2018-09-05 DIAGNOSIS — G7000 Myasthenia gravis without (acute) exacerbation: Secondary | ICD-10-CM | POA: Diagnosis not present

## 2018-09-05 DIAGNOSIS — R7881 Bacteremia: Secondary | ICD-10-CM

## 2018-09-05 HISTORY — PX: TEE WITHOUT CARDIOVERSION: SHX5443

## 2018-09-05 LAB — GLUCOSE, CAPILLARY: GLUCOSE-CAPILLARY: 151 mg/dL — AB (ref 70–99)

## 2018-09-05 SURGERY — ECHOCARDIOGRAM, TRANSESOPHAGEAL
Anesthesia: Moderate Sedation

## 2018-09-05 MED ORDER — SODIUM CHLORIDE FLUSH 0.9 % IV SOLN
INTRAVENOUS | Status: AC
  Filled 2018-09-05: qty 10

## 2018-09-05 MED ORDER — MIDAZOLAM HCL 5 MG/5ML IJ SOLN
INTRAMUSCULAR | Status: AC
  Filled 2018-09-05: qty 5

## 2018-09-05 MED ORDER — LIDOCAINE VISCOUS HCL 2 % MT SOLN
OROMUCOSAL | Status: AC
  Filled 2018-09-05: qty 15

## 2018-09-05 MED ORDER — SODIUM CHLORIDE 0.9 % IV SOLN
INTRAVENOUS | Status: DC
  Administered 2018-09-05: 08:00:00 via INTRAVENOUS

## 2018-09-05 MED ORDER — BUTAMBEN-TETRACAINE-BENZOCAINE 2-2-14 % EX AERO
INHALATION_SPRAY | CUTANEOUS | Status: AC
  Filled 2018-09-05: qty 5

## 2018-09-05 MED ORDER — FENTANYL CITRATE (PF) 100 MCG/2ML IJ SOLN
INTRAMUSCULAR | Status: AC | PRN
  Administered 2018-09-05: 50 ug via INTRAVENOUS

## 2018-09-05 MED ORDER — FENTANYL CITRATE (PF) 100 MCG/2ML IJ SOLN
INTRAMUSCULAR | Status: AC
  Filled 2018-09-05: qty 2

## 2018-09-05 MED ORDER — MIDAZOLAM HCL 5 MG/5ML IJ SOLN
INTRAMUSCULAR | Status: AC | PRN
  Administered 2018-09-05: 2 mg via INTRAVENOUS

## 2018-09-05 NOTE — Interval H&P Note (Signed)
History and Physical Interval Note:  09/05/2018 12:10 PM  [name]  has presented today for surgery, with the diagnosis of TEE   Sepsis    Start time 8:30a  The various methods of treatment have been discussed with the patient and family. After consideration of risks, benefits and other options for treatment, the patient has consented to  Procedure(s): TRANSESOPHAGEAL ECHOCARDIOGRAM (TEE) (N/A) as a surgical intervention .  The patient's history has been reviewed, patient examined, no change in status, stable for surgery.  I have reviewed the patient's chart and labs.  Questions were answered to the patient's satisfaction.     [name]

## 2018-09-05 NOTE — Progress Notes (Signed)
*  PRELIMINARY RESULTS* Echocardiogram Echocardiogram Transesophageal has been performed.  [name] 09/05/2018, 8:59 AM

## 2018-09-05 NOTE — CV Procedure (Signed)
A TEE was performed without complications. See TEE report.  During this procedure the patient is administered a total of Versed 2 mg and Fentanyl 50 mcg to achieve and maintain moderate conscious sedation.  The patient's heart rate, blood pressure, and oxygen saturation are monitored continuously during the procedure. The period of conscious sedation is 12 minutes, of which I was present face-to-face 100% of this time.

## 2018-09-06 ENCOUNTER — Ambulatory Visit: Payer: Medicare Other | Admitting: Infectious Disease

## 2018-09-06 ENCOUNTER — Encounter: Payer: Self-pay | Admitting: Infectious Disease

## 2018-09-06 VITALS — BP 149/88 | HR 105 | Temp 98.9°F | Wt 239.0 lb

## 2018-09-06 DIAGNOSIS — A419 Sepsis, unspecified organism: Secondary | ICD-10-CM

## 2018-09-06 DIAGNOSIS — R509 Fever, unspecified: Secondary | ICD-10-CM | POA: Diagnosis not present

## 2018-09-06 DIAGNOSIS — I255 Ischemic cardiomyopathy: Secondary | ICD-10-CM | POA: Diagnosis not present

## 2018-09-06 DIAGNOSIS — G7000 Myasthenia gravis without (acute) exacerbation: Secondary | ICD-10-CM

## 2018-09-06 DIAGNOSIS — M5442 Lumbago with sciatica, left side: Secondary | ICD-10-CM | POA: Diagnosis not present

## 2018-09-06 DIAGNOSIS — G8929 Other chronic pain: Secondary | ICD-10-CM

## 2018-09-06 NOTE — Progress Notes (Signed)
Chief complaint follow-up for FUO having some joint pains, still with hearing loss   Subjective:    Patient ID: [name], male    DOB: 1961-06-08, 57 y.o.   MRN: [id number]  HPI  57 y.o.male with  with a history of diabetes mellitus, myasthenia gravis, coronary artery disease peripheral neuropathy and feeling relatively well until early October when he became abruptly confused and progressively lethargic on 3 October.  He was brought to the ER by it by his son.  He had some slurred speech and was confused.  In the ER hypotensive and with a lactic acidosis and thought to be suffering from severe sepsis.  Blood cultures were done a urine analysis and culture were done (which both were unrevealing with negative growth) CT the abdomen and pelvis was performed which suggested possible colitis./ His PCT was as high at 105. He was treated with IV vancomycin and cefepime and then was discharged home on Flagyl and Levaquin . During the stay he is current cognition improved dramatically on antibiotics and fluids he did not have however abdominal pain and only had had one loose bowel movement prior to admission. He completed the Levaquin on Tuesday, 19 July 2018.  He then started having chills on Wednesday and a temperature was more than 101 and so EMS was called and he was brought to the emergency department on 07/20/2018 with chills and fever on 07/20/2018.  Is again hypotensive and with a lactic acidosis.  During this hospitalization he was again hypotensive and was treated with fluids and intravenous antibiotics form of cefepime and then Zosyn.  He was an inpatient through the 20th when he was discharged.  Work-up still during this hospitalization did not find a clear-cut source of his infection.  He had a CT chest abdomen pelvis which was again completely unrevealing.  He was discharged with Augmentin and doxycycline which she has both taken for now 10 days time.  Tested negative for HIV during  that hospitalization.  His sedimentation rate was elevated at 51  Been seen by infectious disease at [facility] Dr. Delaine Lame but I believe her clinic may not yet be up and running and/or separate referral had been made to [place] for ID consultation.    He had trouble tolerating these antibiotics with several episodes of vomiting.  Since he has had 10 days of doxycycline I have asked him to please stop this now.  I certainly do not think that a tickborne infection such as ehrlichiosis or [facility] spotted fever would have been likely based on his presentation and would be extremely rare to see this happen with normal platelets and after treatment with a fluoroquinolone.  I found it  instructive that he improved on systemic antibiotics and that he abruptly worsened once Levaquin and Flagyl were discontinued.  He has a history of having mid treated for Lyme disease.  He does sound like he had a erythema migrans infection and that he suffered from arthritis but then seemed to have been treated for more than a year for chronic symptoms of malaise fatigue and can and difficulty concentrating that sounds more like the noninfectious post Lyme phenomena.  However I wonder if this was a time where his myasthenia was being missed diagnosis as the diagnosis from I seen it was made after that.  In fact when I checked for antibodies to Lyme they were negative  He has had long-standing low back pain with sciatic cup.  In fact he  was going to see a physician for potential corticosteroid injection when he became more confused.  Also noted irregularities and elevated blood sugars recently which he has had in the past when dealing with infection.  I so I think aggressive imaging of his back is warranted and aggressive imaging for any potential source of infection is important.  We did this and the MRI was negative for infection  He denies having recent dental pain and sees a dentist regularly though he does  have several areas where caries have had fillings placed.  Did a Panorex but he did not have it done yet which will have it done today.  While he is a diabetic he has no evidence of diabetic foot ulcers on exam.  He does have a pin in his left foot but the pain there has not increased and he has no overlying erythema.  Has had intermittent corticosteroids in the past but not continuous cortical steroid therapy.  I do think adrenal insufficiency could be considered but I am worried that he has recurrent bacterial infection that is not been diagnosed.    The last saw him as mentioned we obtained MRI of the brain which was normal and MRI of the lumbar spine which was normal we obtained a transesophageal echocardiogram thanks to his cardiologist and this was also negative for infection.  He has not yet had the Panorex study.  He did suffer some hearing loss and was seen by ciprodex and then saw ENT  who gave him some corticosteroids in case this was due to a virus they were also getting an MRI study this week.  His case with ENT with [name] and    did mention her work-up we had undertaken including autoimmune work-up for him.  So far other than the sed rate and C-reactive protein being elevated the other work-up has been unremarkable.  Is felt better than when I saw him last and has not had any relapses of systemic symptoms to suggest recurrent bacteremia.  He has been complaining of pain in his elbows and his joints but he has no effusions in these areas.  Does not have any clear deformities there.  I did notice he had some lymphopenia when he was sick and thought about work-up for autoimmune disease and also idiopathic CD4 lymphopenia.  Absolute lymphocyte count count has normalized since then though.   Past Medical History:  Diagnosis Date  . Allergy    dust, seasonal (worse in the fall).  . 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 Followed by Pain Specialist in [place], back and neck  . Asthma      BRONCHITIS  . Bacterial infection, unspecified    patient stated that it is unknown origin  . Cataract    First Dx in 2012  . Chronic combined systolic and diastolic congestive heart failure ([place])    a. 03/2018 Echo: EF 30-35%, ant, antlat, apical AK, Gr1 DD; b. 07/2018 Echo: EF 35-40%, anteroseptal, apical, and ant HK. Gr1 DD.  [name] Coronary artery disease    a. Prior Ant MI->s/p multiple stents placed in the LAD and right coronary artery ([place]); b. 2016 Cath: reportedly nonobs dzs;  c. 02/2017 MV: EF 45-54%, ap/ant, ap/inf, apical infarct, no ishcemia; c. 04/2018 Cath/PCI: LM nl, LAD 20p, patent mid stent, LCX [id number] [place] DES), OM1 nl, OM2 50, OM3 40 w/ patent stent, RCA 40p, 264m40d w/ patent stent in RPDA, RPAV 60, EF 25-35%. 2+MR.  . Diabetes mellitus  without complication ([place])    TYPE 2  . Diabetic peripheral neuropathy (HCC)    feet and hands  . FUO (fever of unknown origin) 08/03/2018  . GERD (gastroesophageal reflux disease)   . Headache    muscle tension  . Hyperlipidemia   . Hypertension    CONTROLLED ON MEDS  . Insomnia   . Ischemic cardiomyopathy    a. 03/2018 Echo: EF 30-35%, ant, antlat, apical AK, Gr1 DD, mild MR, mildly dil LA; b. 07/2018 Echo: EF 35-40%.  . Lyme disease    Chronic  . Myasthenia gravis ([place])   . Myocardial infarction ([place]) 2010  . Seasonal allergies   . Sleep apnea    CPAP    Past Surgical History:  Procedure Laterality Date  . BILATERAL CARPAL TUNNEL RELEASE Bilateral L in 2012 and R in 2013  . CARDIAC CATHETERIZATION     Several Caths, most recent in  March 2016.  [name] COLONOSCOPY WITH PROPOFOL N/A 01/10/2016   Procedure: COLONOSCOPY WITH PROPOFOL;  Surgeon: [name], MD;  Location: [facility] ENDOSCOPY;  Service: Endoscopy;  Laterality: N/A;  . CORONARY ANGIOPLASTY    . CORONARY STENT INTERVENTION N/A 04/25/2018   Procedure: CORONARY STENT INTERVENTION;  Surgeon: [name], MD;  Location: [facility];  Service:  Cardiovascular;  Laterality: N/A;  . ESOPHAGOGASTRODUODENOSCOPY (EGD) WITH PROPOFOL N/A 01/10/2016   Procedure: ESOPHAGOGASTRODUODENOSCOPY (EGD) WITH PROPOFOL;  Surgeon: [name], MD;  Location: [facility] ENDOSCOPY;  Service: Endoscopy;  Laterality: N/A;  . EYE SURGERY Bilateral 2012   cataract/bilateral vitrectomies  . LEFT HEART CATH AND CORONARY ANGIOGRAPHY Left 04/25/2018   Procedure: LEFT HEART CATH AND CORONARY ANGIOGRAPHY;  Surgeon: [name], MD;  Location: [facility] [facility];  Service: Cardiovascular;  Laterality: Left;  . TONSILLECTOMY AND ADENOIDECTOMY     As a child  . TUNNELED VENOUS CATHETER PLACEMENT     removed    Family History  Problem Relation Age of Onset  . Diabetes Mother   . Heart disease Mother   . Cancer Father        Prostate CA  . Diabetes Brother   . Healthy Brother   . Healthy Brother       Social History   Socioeconomic History  . Marital status: Married    Spouse name: Neoma Laming  . Number of children: 0  . Years of education: Not on file  . Highest education level: Master's degree (e.g., MA, MS, MEng, MEd, MSW, MBA)  Occupational History  . Occupation: disabled    Comment: multiple medical problems - first approved for uncontrolled DM, but now has heart disease and Myasthenia Gravis   Social Needs  . Financial resource strain: Not hard at all  . Food insecurity:    Worry: Never true    Inability: Never true  . Transportation needs:    Medical: No    Non-medical: No  Tobacco Use  . Smoking status: Never Smoker  . Smokeless tobacco: Never Used  . Tobacco comment: smoking cessation materials not required  Substance and Sexual Activity  . Alcohol use: Not Currently    Alcohol/week: 0.0 standard drinks  . Drug use: No  . Sexual activity: Not Currently    Partners: Female  Lifestyle  . Physical activity:    Days per week: 7 days    Minutes per session: 30 min  . Stress: Not at all  Relationships  . Social connections:    Talks  on phone: More than three  times a week    Gets together: Three times a week    Attends religious service: More than 4 times per year    Active member of club or organization: Yes    Attends meetings of clubs or organizations: More than 4 times per year    Relationship status: Married  Other Topics Concern  . Not on file  Social History Narrative  . Not on file    Allergies  Allergen Reactions  . Metoprolol Rash  . Novolog [Insulin Aspart] Hives     Current Outpatient Medications:  .  albuterol (PROVENTIL HFA;VENTOLIN HFA) 108 (90 Base) MCG/ACT inhaler, Inhale 2 puffs into the lungs every 6 (six) hours as needed for wheezing or shortness of breath., Disp: , Rfl:  .  albuterol (PROVENTIL) (2.5 MG/3ML) 0.083% nebulizer solution, Take 2.5 mg by nebulization every 6 (six) hours as needed for wheezing or shortness of breath., Disp: , Rfl:  .  aspirin EC 81 MG tablet, Take 1 tablet (81 mg total) by mouth daily., Disp: 90 tablet, Rfl: 3 .  BREO ELLIPTA 200-25 MCG/INH AEPB, INHALE 1 PUFF INTO THE LUNGS DAILY (Patient taking differently: Inhale 1 puff into the lungs daily. ), Disp: 1 each, Rfl: 3 .  carvedilol (COREG) 6.25 MG tablet, Take 1 tablet (6.25 mg total) by mouth 2 (two) times daily., Disp: 180 tablet, Rfl: 3 .  clopidogrel (PLAVIX) 75 MG tablet, Take 1 tablet (75 mg total) by mouth daily with breakfast., Disp: 30 tablet, Rfl: 6 .  cyanocobalamin 1000 MCG tablet, Take 1,000 mcg by mouth daily. , Disp: , Rfl:  .  cyclobenzaprine (FEXMID) 7.5 MG tablet, Take 7.5 mg by mouth 3 (three) times daily as needed for muscle spasms., Disp: , Rfl:  .  dexlansoprazole (DEXILANT) 60 MG capsule, Take 1 capsule (60 mg total) by mouth daily., Disp: 30 capsule, Rfl: 6 .  gabapentin (NEURONTIN) 300 MG capsule, Take 1 capsule (300 mg total) by mouth 3 (three) times daily., Disp: 90 capsule, Rfl: 1 .  insulin lispro (HUMALOG KWIKPEN) 100 UNIT/ML KiwkPen, Inject 7-8 Units into the skin 3 (three) times daily.  Reported on 12/02/2015/ sliding scale 1 unit for every 8 units of carbs; and 1 unit for every 20 above 120, Disp: , Rfl:  .  Insulin Pen Needle (FIFTY50 PEN NEEDLES) 32G X 4 MM MISC, Inject 1 each into the skin as needed., Disp: , Rfl:  .  LANTUS SOLOSTAR 100 UNIT/ML Solostar Pen, Inject 25-30 Units into the skin daily at 10 pm. , Disp: , Rfl: 0 .  levocetirizine (XYZAL) 5 MG tablet, Take 1 tablet (5 mg total) by mouth every evening., Disp: 30 tablet, Rfl: 3 .  liraglutide (VICTOZA) 18 MG/3ML SOPN, 1.8 units every morning (Patient taking differently: Inject 1.8 mg into the skin daily. ), Disp: , Rfl:  .  loratadine (CLARITIN) 10 MG tablet, Take 10 mg by mouth daily. , Disp: , Rfl:  .  metFORMIN (GLUCOPHAGE) 1000 MG tablet, Take 1,000 mg by mouth 2 (two) times daily with a meal., Disp: , Rfl:  .  naloxone (NARCAN) nasal spray 4 mg/0.1 mL, For excess sedation from opioids, Disp: 1 kit, Rfl: 2 .  nitroGLYCERIN (NITROSTAT) 0.3 MG SL tablet, Place 1 tablet (0.3 mg total) under the tongue every 5 (five) minutes as needed for chest pain. Maximum 3 doses., Disp: 25 tablet, Rfl: 3 .  oxyCODONE (OXYCONTIN) 10 mg 12 hr tablet, Take 10 mg by mouth 4 (four) times daily as  needed., Disp: , Rfl:  .  rosuvastatin (CRESTOR) 5 MG tablet, Take 5 mg by mouth daily., Disp: , Rfl: 0 .  sacubitril-valsartan (ENTRESTO) 24-26 MG, Take 1 tablet by mouth 2 (two) times daily., Disp: 60 tablet, Rfl: 6 .  ciprofloxacin-hydrocortisone (CIPRO HC) OTIC suspension, Place 3 drops into both ears 2 (two) times daily. (Patient not taking: Reported on 09/05/2018), Disp: 10 mL, Rfl: 0 .  ezetimibe (ZETIA) 10 MG tablet, Take 1 tablet (10 mg total) by mouth daily., Disp: 90 tablet, Rfl: 3    Review of Systems  Constitutional: Negative for activity change, appetite change, chills, diaphoresis, fatigue and fever.  HENT: Positive for hearing loss. Negative for congestion, dental problem, ear pain, facial swelling, mouth sores, postnasal drip,  rhinorrhea, sinus pressure, sinus pain, sneezing and sore throat.   Eyes: Negative for photophobia.  Respiratory: Negative for cough, shortness of breath and wheezing.   Cardiovascular: Negative for chest pain, palpitations and leg swelling.  Gastrointestinal: Positive for vomiting. Negative for abdominal distention, abdominal pain, blood in stool, constipation, diarrhea, nausea and rectal pain.  Endocrine: Negative for polyuria.  Genitourinary: Negative for dysuria, flank pain and hematuria.  Musculoskeletal: Positive for arthralgias. Negative for back pain.  Skin: Negative for rash.  Neurological: Negative for dizziness, weakness and headaches.  Hematological: Does not bruise/bleed easily.  Psychiatric/Behavioral: Negative for agitation, confusion, dysphoric mood, hallucinations, self-injury, sleep disturbance and suicidal ideas. The patient is not nervous/anxious and is not hyperactive.        Objective:   Physical Exam  Constitutional: He is oriented to person, place, and time. He appears well-developed and well-nourished.  HENT:  Head: Normocephalic and atraumatic.  Mouth/Throat: Oropharynx is clear and moist. Abnormal dentition. No oropharyngeal exudate.    Eyes: Conjunctivae and EOM are normal. Right eye exhibits no chemosis, no discharge and no exudate. Left eye exhibits no discharge. Right conjunctiva is not injected. Right conjunctiva has no hemorrhage. Left conjunctiva has no hemorrhage.    Neck: Normal range of motion. Neck supple.  Cardiovascular: Normal rate, regular rhythm, normal heart sounds and intact distal pulses. Exam reveals no gallop and no friction rub.  No murmur heard. Pulmonary/Chest: Effort normal and breath sounds normal. No stridor. No respiratory distress. He has no wheezes. He has no rales. He exhibits no tenderness.  Abdominal: Soft. Bowel sounds are normal. He exhibits no distension and no mass. There is no tenderness. There is no guarding.    Musculoskeletal: Normal range of motion. He exhibits no edema or tenderness.       Right elbow: Normal.      Left elbow: Normal.       Right knee: Normal.       Left knee: Normal.  Neurological: He is alert and oriented to person, place, and time.  Skin: Skin is warm and dry. No rash noted. No erythema. No pallor.  Psychiatric: He has a normal mood and affect. His behavior is normal. Judgment and thought content normal.   He has pain in both knees but there is no deformity or effusion there.    Assessment & Plan:   Recurrent FUO with septic physiology, lactic acidosis:  Had another episode of this since I saw him.  Not since stopping the antibiotics that he was on.  I would like him to be seen by a dentist and I have ordered another Panorex.  I am also going to work him up for some immune deficiencies by checking immunoglobulins today CD4 count and a  neutrophil burst test.  Consider referral to rheumatology though I do not think there is enough clear-cut evidence for them to be seeing him yet they may be able to help [place] if this continues to be a problem.  I spent greater than 25 minutes with the patient including greater than 50% of time in face to face counsel of the patient and his son with regards to the work-up of FUO and review of all of his labs and in coordination of his care.

## 2018-09-07 DIAGNOSIS — N189 Chronic kidney disease, unspecified: Secondary | ICD-10-CM | POA: Diagnosis not present

## 2018-09-07 DIAGNOSIS — E785 Hyperlipidemia, unspecified: Secondary | ICD-10-CM | POA: Diagnosis not present

## 2018-09-07 DIAGNOSIS — M4697 Unspecified inflammatory spondylopathy, lumbosacral region: Secondary | ICD-10-CM | POA: Diagnosis not present

## 2018-09-07 DIAGNOSIS — E1165 Type 2 diabetes mellitus with hyperglycemia: Secondary | ICD-10-CM | POA: Diagnosis not present

## 2018-09-07 DIAGNOSIS — I5042 Chronic combined systolic (congestive) and diastolic (congestive) heart failure: Secondary | ICD-10-CM | POA: Diagnosis not present

## 2018-09-07 DIAGNOSIS — Z794 Long term (current) use of insulin: Secondary | ICD-10-CM | POA: Diagnosis not present

## 2018-09-07 DIAGNOSIS — I2510 Atherosclerotic heart disease of native coronary artery without angina pectoris: Secondary | ICD-10-CM | POA: Diagnosis not present

## 2018-09-07 DIAGNOSIS — A419 Sepsis, unspecified organism: Secondary | ICD-10-CM | POA: Diagnosis not present

## 2018-09-07 DIAGNOSIS — I130 Hypertensive heart and chronic kidney disease with heart failure and stage 1 through stage 4 chronic kidney disease, or unspecified chronic kidney disease: Secondary | ICD-10-CM | POA: Diagnosis not present

## 2018-09-07 DIAGNOSIS — G7000 Myasthenia gravis without (acute) exacerbation: Secondary | ICD-10-CM | POA: Diagnosis not present

## 2018-09-07 DIAGNOSIS — E1142 Type 2 diabetes mellitus with diabetic polyneuropathy: Secondary | ICD-10-CM | POA: Diagnosis not present

## 2018-09-07 DIAGNOSIS — E1122 Type 2 diabetes mellitus with diabetic chronic kidney disease: Secondary | ICD-10-CM | POA: Diagnosis not present

## 2018-09-07 DIAGNOSIS — K219 Gastro-esophageal reflux disease without esophagitis: Secondary | ICD-10-CM | POA: Diagnosis not present

## 2018-09-07 DIAGNOSIS — A6923 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: Secondary | ICD-10-CM | POA: Diagnosis not present

## 2018-09-07 DIAGNOSIS — G4733 Obstructive sleep apnea (adult) (pediatric): Secondary | ICD-10-CM | POA: Diagnosis not present

## 2018-09-07 DIAGNOSIS — J4540 Moderate persistent asthma, uncomplicated: Secondary | ICD-10-CM | POA: Diagnosis not present

## 2018-09-07 DIAGNOSIS — R6520 Severe sepsis without septic shock: Secondary | ICD-10-CM | POA: Diagnosis not present

## 2018-09-07 LAB — IGG, IGA, IGM
IGM, SERUM: 86 mg/dL (ref 50–300)
IgG (Immunoglobin G), Serum: 464 mg/dL — ABNORMAL LOW (ref 600–1640)
Immunoglobulin A: 82 mg/dL (ref 47–310)

## 2018-09-08 ENCOUNTER — Encounter: Payer: Self-pay | Admitting: Cardiovascular Disease

## 2018-09-08 ENCOUNTER — Ambulatory Visit: Payer: Medicare Other | Admitting: Cardiovascular Disease

## 2018-09-08 VITALS — BP 138/64 | HR 107 | Ht 67.0 in | Wt 236.0 lb

## 2018-09-08 DIAGNOSIS — E785 Hyperlipidemia, unspecified: Secondary | ICD-10-CM | POA: Diagnosis not present

## 2018-09-08 DIAGNOSIS — E1142 Type 2 diabetes mellitus with diabetic polyneuropathy: Secondary | ICD-10-CM | POA: Diagnosis not present

## 2018-09-08 DIAGNOSIS — A419 Sepsis, unspecified organism: Secondary | ICD-10-CM | POA: Diagnosis not present

## 2018-09-08 DIAGNOSIS — E1122 Type 2 diabetes mellitus with diabetic chronic kidney disease: Secondary | ICD-10-CM | POA: Diagnosis not present

## 2018-09-08 DIAGNOSIS — I130 Hypertensive heart and chronic kidney disease with heart failure and stage 1 through stage 4 chronic kidney disease, or unspecified chronic kidney disease: Secondary | ICD-10-CM | POA: Diagnosis not present

## 2018-09-08 DIAGNOSIS — I25118 Atherosclerotic heart disease of native coronary artery with other forms of angina pectoris: Secondary | ICD-10-CM

## 2018-09-08 DIAGNOSIS — I5022 Chronic systolic (congestive) heart failure: Secondary | ICD-10-CM

## 2018-09-08 DIAGNOSIS — G4733 Obstructive sleep apnea (adult) (pediatric): Secondary | ICD-10-CM | POA: Diagnosis not present

## 2018-09-08 DIAGNOSIS — Z794 Long term (current) use of insulin: Secondary | ICD-10-CM | POA: Diagnosis not present

## 2018-09-08 DIAGNOSIS — R6520 Severe sepsis without septic shock: Secondary | ICD-10-CM | POA: Diagnosis not present

## 2018-09-08 DIAGNOSIS — I2510 Atherosclerotic heart disease of native coronary artery without angina pectoris: Secondary | ICD-10-CM | POA: Diagnosis not present

## 2018-09-08 DIAGNOSIS — I5042 Chronic combined systolic (congestive) and diastolic (congestive) heart failure: Secondary | ICD-10-CM | POA: Diagnosis not present

## 2018-09-08 DIAGNOSIS — M4697 Unspecified inflammatory spondylopathy, lumbosacral region: Secondary | ICD-10-CM | POA: Diagnosis not present

## 2018-09-08 DIAGNOSIS — J4540 Moderate persistent asthma, uncomplicated: Secondary | ICD-10-CM | POA: Diagnosis not present

## 2018-09-08 DIAGNOSIS — I10 Essential (primary) hypertension: Secondary | ICD-10-CM | POA: Diagnosis not present

## 2018-09-08 DIAGNOSIS — K219 Gastro-esophageal reflux disease without esophagitis: Secondary | ICD-10-CM | POA: Diagnosis not present

## 2018-09-08 DIAGNOSIS — N189 Chronic kidney disease, unspecified: Secondary | ICD-10-CM | POA: Diagnosis not present

## 2018-09-08 DIAGNOSIS — A6923 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: Secondary | ICD-10-CM | POA: Diagnosis not present

## 2018-09-08 DIAGNOSIS — G7000 Myasthenia gravis without (acute) exacerbation: Secondary | ICD-10-CM | POA: Diagnosis not present

## 2018-09-08 LAB — T-HELPER CELL (CD4) - (RCID CLINIC ONLY)
CD4 % Helper T Cell: 34 % (ref 33–55)
CD4 T Cell Abs: 350 /uL — ABNORMAL LOW (ref 400–2700)

## 2018-09-08 MED ORDER — CARVEDILOL 12.5 MG PO TABS: 12.5000 mg | ORAL_TABLET | Freq: Two times a day (BID) | ORAL | 1 refills | Status: DC

## 2018-09-08 NOTE — Progress Notes (Signed)
Cardiology Office Note   Date:  09/08/2018   ID:  [name], DOB 03-06-1961, MRN [id number]  PCP:  [name], MD  Cardiologist:   [name], MD   Chief Complaint  Patient presents with  . other    Awaiting TEE 3 mo follow up.Medications reviewed verbally.      History of Present Illness: [name] is a 57 y.o. male who presents for a follow-up visit regarding coronary artery disease and chronic systolic heart failure.  He has extensive cardiac history. He had a total of 10 stents placed (in LAD and RCA) starting in 2009 after he presented with myocardial infarction.  He has other chronic medical conditions that include diabetes, hypertension, hyperlipidemia,myasthenia gravis,  generalized pain due to reported Lyme disease and obesity.  Echocardiogram in January 2017 Showed normal LV systolic function and wall motion with no evidence of pulmonary hypertension. (However, in retrospect after reviewing the study which was very technically difficult, EF was likely in the range of 40 to 45% with severe anterior and apical hypokinesis) He has history of hyperlipidemia with intolerance to high-dose statins.  Most recent nuclear stress test in May 2018 showed evidence of prior mid to distal anterior and apical infarct without significant ischemia. EF was mildly reduced at 45-54%.  He had worsening heart failure in June of this year.  Echocardiogram showed worsening LV systolic function with an EF of 30% with akinesis of the anterior, anterolateral and apical myocardium.  There was mild mitral regurgitation.  Due to worsening cardiomyopathy and symptoms, I proceeded with left heart catheterization in July which showed patent stents in the LAD, OM's and right coronary artery.  There was a new mid left circumflex stenosis which was stented.  EF was 25 to 30%.  LVEDP was severely elevated.  The patient was started on furosemide.  Entresto was added. He was hospitalized with  October with infection of unclear etiology with lactic acidosis and elevated troponin at 11.  Echo showed an EF of 30 to 35%..  Due to active infection, no catheterization was performed and the patient returned back again in October with similar presentation but his troponin was only mildly elevated.  He was seen by infectious diseases for fever of unknown origin.  They have recommended a transesophageal echocardiogram to exclude the possibility of endocarditis.  I proceeded with this on Monday which showed no evidence of vegetations.  His EF was improved to 40 to 45%. He has been doing better overall and reports improvement in symptoms.  He has occasional chest pain that requires nitroglycerin but this is not very frequent.  Shortness of breath is stable.  Past Medical History:  Diagnosis Date  . Allergy    dust, seasonal (worse in the fall).  . 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 Followed by Pain Specialist in [place], back and neck  . Asthma    BRONCHITIS  . Bacterial infection, unspecified    patient stated that it is unknown origin  . Cataract    First Dx in 2012  . Chronic combined systolic and diastolic congestive heart failure ([place])    a. 03/2018 Echo: EF 30-35%, ant, antlat, apical AK, Gr1 DD; b. 07/2018 Echo: EF 35-40%, anteroseptal, apical, and ant HK. Gr1 DD.  [name] Coronary artery disease    a. Prior Ant MI->s/p multiple stents placed in the LAD and right coronary artery ([place]); b. 2016 Cath: reportedly nonobs dzs;  c. 02/2017 MV: EF 45-54%, ap/ant, ap/inf,  apical infarct, no ishcemia; c. 04/2018 Cath/PCI: LM nl, LAD 20p, patent mid stent, LCX [id number] [place] DES), OM1 nl, OM2 50, OM3 40 w/ patent stent, RCA 40p, 273m40d w/ patent stent in RPDA, RPAV 60, EF 25-35%. 2+MR.  . Diabetes mellitus without complication ([place]   TYPE 2  . Diabetic peripheral neuropathy (HCC)    feet and hands  . FUO (fever of unknown origin) 08/03/2018  . GERD (gastroesophageal reflux disease)   . Headache     muscle tension  . Hyperlipidemia   . Hypertension    CONTROLLED ON MEDS  . Insomnia   . Ischemic cardiomyopathy    a. 03/2018 Echo: EF 30-35%, ant, antlat, apical AK, Gr1 DD, mild MR, mildly dil LA; b. 07/2018 Echo: EF 35-40%.  . Lyme disease    Chronic  . Myasthenia gravis ([facility]  . Myocardial infarction ([facility]  . Seasonal allergies   . Sleep apnea    CPAP    Past Surgical History:  Procedure Laterality Date  . BILATERAL CARPAL TUNNEL RELEASE Bilateral L in 2012 and R in 2013  . CARDIAC CATHETERIZATION     Several Caths, most recent in  March 2016.  . Marland KitchenOLONOSCOPY WITH PROPOFOL N/A 01/10/2016   Procedure: COLONOSCOPY WITH PROPOFOL;  Surgeon: DaLucilla LameMD;  Location: [facility];  Service: Endoscopy;  Laterality: N/A;  . CORONARY ANGIOPLASTY    . CORONARY STENT INTERVENTION N/A 04/25/2018   Procedure: CORONARY STENT INTERVENTION;  Surgeon: ArWellington HampshireMD;  [facility];  Service: Cardiovascular;  Laterality: N/A;  . ESOPHAGOGASTRODUODENOSCOPY (EGD) WITH PROPOFOL N/A 01/10/2016   Procedure: ESOPHAGOGASTRODUODENOSCOPY (EGD) WITH PROPOFOL;  Surgeon: DaLucilla LameMD;  Location: [facility] END[facility]COPY;  Service: Endoscopy;  Laterality: N/A;  . EYE SURGERY Bilateral 2012   cataract/bilateral vitrectomies  . LEFT HEART CATH AND CORONARY ANGIOGRAPHY Left 04/25/2018   Procedure: LEFT HEART CATH AND CORONARY ANGIOGRAPHY;  Surgeon: ArWellington HampshireMD;  [facility];  Service: Cardiovascular;  Laterality: Left;  . TEE WITHOUT CARDIOVERSION N/A 09/05/2018   Procedure: TRANSESOPHAGEAL ECHOCARDIOGRAM (TEE);  Surgeon: ArWellington HampshireMD;  Location: [facility];  Service: Cardiovascular;  Laterality: N/A;  . TONSILLECTOMY AND ADENOIDECTOMY     As a child  . TUNNELED VENOUS CATHETER PLACEMENT     removed     Current Outpatient Medications  Medication Sig Dispense Refill  . albuterol (PROVENTIL HFA;VENTOLIN HFA) 108 (90 Base) MCG/ACT inhaler Inhale 2  puffs into the lungs every 6 (six) hours as needed for wheezing or shortness of breath.    . Marland Kitchenlbuterol (PROVENTIL) (2.5 MG/3ML) 0.083% nebulizer solution Take 2.5 mg by nebulization every 6 (six) hours as needed for wheezing or shortness of breath.    . Marland Kitchenspirin EC 81 MG tablet Take 1 tablet (81 mg total) by mouth daily. 90 tablet 3  . BREO ELLIPTA 200-25 MCG/INH AEPB INHALE 1 PUFF INTO THE LUNGS DAILY (Patient taking differently: Inhale 1 puff into the lungs daily. ) 1 each 3  . carvedilol (COREG) 6.25 MG tablet Take 1 tablet (6.25 mg total) by mouth 2 (two) times daily. 180 tablet 3  . clopidogrel (PLAVIX) 75 MG tablet Take 1 tablet (75 mg total) by mouth daily with breakfast. 30 tablet 6  . cyanocobalamin 1000 MCG tablet Take 1,000 mcg by mouth daily.     . cyclobenzaprine (FEXMID) 7.5 MG tablet Take 7.5 mg by mouth 3 (three) times daily as needed for muscle spasms.    .[name]  dexlansoprazole (DEXILANT) 60 MG capsule Take 1 capsule (60 mg total) by mouth daily. 30 capsule 6  . gabapentin (NEURONTIN) 300 MG capsule Take 1 capsule (300 mg total) by mouth 3 (three) times daily. 90 capsule 1  . insulin lispro (HUMALOG KWIKPEN) 100 UNIT/ML KiwkPen Inject 7-8 Units into the skin 3 (three) times daily. Reported on 12/02/2015/ sliding scale 1 unit for every 8 units of carbs; and 1 unit for every 20 above 120    . Insulin Pen Needle (FIFTY50 PEN NEEDLES) 32G X 4 MM MISC Inject 1 each into the skin as needed.    [name] LANTUS SOLOSTAR 100 UNIT/ML Solostar Pen Inject 25-30 Units into the skin daily at 10 pm.   0  . levocetirizine (XYZAL) 5 MG tablet Take 1 tablet (5 mg total) by mouth every evening. 30 tablet 3  . liraglutide (VICTOZA) 18 MG/3ML SOPN 1.8 units every morning (Patient taking differently: Inject 1.8 mg into the skin daily. )    . loratadine (CLARITIN) 10 MG tablet Take 10 mg by mouth daily.     . metFORMIN (GLUCOPHAGE) 1000 MG tablet Take 1,000 mg by mouth 2 (two) times daily with a meal.    . naloxone  (NARCAN) nasal spray 4 mg/0.1 mL For excess sedation from opioids 1 kit 2  . nitroGLYCERIN (NITROSTAT) 0.3 MG SL tablet Place 1 tablet (0.3 mg total) under the tongue every 5 (five) minutes as needed for chest pain. Maximum 3 doses. 25 tablet 3  . oxyCODONE (OXYCONTIN) 10 mg 12 hr tablet Take 10 mg by mouth 4 (four) times daily as needed.    . rosuvastatin (CRESTOR) 5 MG tablet Take 5 mg by mouth daily.  0  . sacubitril-valsartan (ENTRESTO) 24-26 MG Take 1 tablet by mouth 2 (two) times daily. 60 tablet 6  . ezetimibe (ZETIA) 10 MG tablet Take 1 tablet (10 mg total) by mouth daily. 90 tablet 3   No current facility-administered medications for this visit.     Allergies:   Metoprolol and Novolog [insulin aspart]    Social History:  The patient  reports that he has never smoked. He has never used smokeless tobacco. He reports that he drank alcohol. He reports that he does not use drugs.   Family History:  The patient's family history includes Cancer in his father; Diabetes in his brother and mother; Healthy in his brother and brother; Heart disease in his mother.    ROS:  Please see the history of present illness.   Otherwise, review of systems are positive for none.   All other systems are reviewed and negative.    PHYSICAL EXAM: VS:  BP 138/64 (BP Location: Left Arm, Patient Position: Sitting, Cuff Size: Normal)   Pulse (!) 107   Ht '5\' 7"'$  (1.702 m)   Wt 236 lb (107 kg)   BMI 36.96 kg/m  , BMI Body mass index is 36.96 kg/m. GEN: Well nourished, well developed, in no acute distress  HEENT: normal  Neck: no JVD, carotid bruits, or masses Cardiac: RRR; no murmurs, rubs, or gallops,no edema  Respiratory:  clear to auscultation bilaterally, normal work of breathing GI: soft, nontender, nondistended, + BS MS: no deformity or atrophy  Skin: warm and dry, no rash Neuro:  Strength and sensation are intact Psych: euthymic mood, full affect   EKG:  EKG is ordered today. The ekg ordered  today demonstrates sinus tachycardia with possible old inferior and anterolateral infarct..   Recent Labs: 11/25/2017: TSH 4.130 08/03/2018:  ALT 17 08/10/2018: BUN 18; Creat 1.07; Hemoglobin 10.3; Platelets 332; Potassium 5.1; Sodium 138    Lipid Panel    Component Value Date/Time   CHOL 109 05/18/2018   CHOL 113 01/17/2018 0819   TRIG 112 05/18/2018   HDL 37 05/18/2018   HDL 33 (L) 01/17/2018 0819   CHOLHDL 3.4 01/17/2018 0819   CHOLHDL 3.7 11/29/2017 1154   VLDL 26 11/29/2017 1154   LDLCALC 50 05/18/2018   LDLCALC 62 01/17/2018 0819      Wt Readings from Last 3 Encounters:  09/08/18 236 lb (107 kg)  09/06/18 239 lb (108.4 kg)  09/05/18 232 lb (105.2 kg)        ASSESSMENT AND PLAN:  1.  Coronary artery disease involving native coronary arteries with other forms of angina:    Status post recent PCI and drug-eluting stent placement to the mid left circumflex.  The patient now has 11 cardiac stents.  I recommend continuing indefinite dual antiplatelet therapy.   He had non-ST elevation myocardial infarction in October in the setting of underlying infection and sepsis.  Given improvement in his symptoms overall I do not recommend repeat cardiac catheterization at the present time.  2.  Chronic systolic heart failure with  reduced LV systolic function due to ischemic cardiomyopathy: Most recent ejection fraction improved to 40 to 45% on TEE.  Continue treatment with Entresto.  I elected to increase carvedilol to 12.5 mg twice daily given persistent sinus tachycardia.   He appears to be euvolemic even off furosemide.  No spironolactone or eplerenone at the present time given frequent hypotension and also he had issues with renal failure.  3. Essential hypertension:   Blood pressure is controlled.  4. Hyperlipidemia: Unfortunately, he is only able to tolerate small dose rosuvastatin 2.5 mg once daily.  LDL improved with the addition of Zetia and most recently was  62.   Disposition:   FU with me in 3 months  Signed,  [name], MD  09/08/2018 9:19 AM    [place]

## 2018-09-08 NOTE — Patient Instructions (Signed)
Medication Instructions:  INCREASE the Carvedilol to 12.5 mg twice daily If you need a refill on your cardiac medications before your next appointment, please call your pharmacy.   Lab work: None ordered  Testing/Procedures: None ordered  Follow-Up: At [facility], you and your health needs are our priority.  As part of our continuing mission to provide you with exceptional heart care, we have created designated Provider Care Teams.  These Care Teams include your primary Cardiologist (physician) and Advanced Practice Providers (APPs -  Physician Assistants and Nurse Practitioners) who all work together to provide you with the care you need, when you need it. You will need a follow up appointment in 3 months.  You may see [name], MD or one of the following Advanced Practice Providers on your designated Care Team:   Murray Hodgkins, NP Christell Faith, PA-C . Marrianne Mood, PA-C

## 2018-09-09 ENCOUNTER — Ambulatory Visit
Admission: RE | Admit: 2018-09-09 | Discharge: 2018-09-09 | Disposition: A | Discharge status: Home / Self Care | Payer: Medicare Other | Source: Ambulatory Visit | Attending: Physician Assistant | Admitting: Physician Assistant

## 2018-09-09 DIAGNOSIS — H9313 Tinnitus, bilateral: Secondary | ICD-10-CM | POA: Diagnosis not present

## 2018-09-09 DIAGNOSIS — H9042 Sensorineural hearing loss, unilateral, left ear, with unrestricted hearing on the contralateral side: Secondary | ICD-10-CM | POA: Insufficient documentation

## 2018-09-09 DIAGNOSIS — IMO0001 Reserved for inherently not codable concepts without codable children: Secondary | ICD-10-CM

## 2018-09-09 MED ORDER — GADOBUTROL 1 MMOL/ML IV SOLN
10.0000 mL | Freq: Once | INTRAVENOUS | Status: AC | PRN
  Administered 2018-09-09: 10 mL via INTRAVENOUS

## 2018-09-11 LAB — NEUTROPHIL OXD BURST
% OXIDATION POSITIVE NEUTROPHILS: 97 %
SPECIMEN AGE: 72

## 2018-09-11 LAB — C-REACTIVE PROTEIN: CRP: 2.7 mg/L (ref <8.0)

## 2018-09-11 LAB — SEDIMENTATION RATE: Sed Rate: 11 mm/h (ref 0–20)

## 2018-09-12 ENCOUNTER — Encounter: Payer: Medicare Other | Admitting: Anesthesiology

## 2018-09-13 DIAGNOSIS — H9042 Sensorineural hearing loss, unilateral, left ear, with unrestricted hearing on the contralateral side: Secondary | ICD-10-CM | POA: Diagnosis not present

## 2018-09-13 DIAGNOSIS — R42 Dizziness and giddiness: Secondary | ICD-10-CM | POA: Diagnosis not present

## 2018-09-13 DIAGNOSIS — J329 Chronic sinusitis, unspecified: Secondary | ICD-10-CM | POA: Diagnosis not present

## 2018-09-13 DIAGNOSIS — H905 Unspecified sensorineural hearing loss: Secondary | ICD-10-CM | POA: Diagnosis not present

## 2018-09-15 DIAGNOSIS — G7000 Myasthenia gravis without (acute) exacerbation: Secondary | ICD-10-CM | POA: Diagnosis not present

## 2018-09-15 DIAGNOSIS — I2510 Atherosclerotic heart disease of native coronary artery without angina pectoris: Secondary | ICD-10-CM | POA: Diagnosis not present

## 2018-09-15 DIAGNOSIS — E1142 Type 2 diabetes mellitus with diabetic polyneuropathy: Secondary | ICD-10-CM | POA: Diagnosis not present

## 2018-09-15 DIAGNOSIS — E1122 Type 2 diabetes mellitus with diabetic chronic kidney disease: Secondary | ICD-10-CM | POA: Diagnosis not present

## 2018-09-15 DIAGNOSIS — Z794 Long term (current) use of insulin: Secondary | ICD-10-CM | POA: Diagnosis not present

## 2018-09-15 DIAGNOSIS — J4540 Moderate persistent asthma, uncomplicated: Secondary | ICD-10-CM | POA: Diagnosis not present

## 2018-09-15 DIAGNOSIS — I5042 Chronic combined systolic (congestive) and diastolic (congestive) heart failure: Secondary | ICD-10-CM | POA: Diagnosis not present

## 2018-09-15 DIAGNOSIS — M4697 Unspecified inflammatory spondylopathy, lumbosacral region: Secondary | ICD-10-CM | POA: Diagnosis not present

## 2018-09-15 DIAGNOSIS — K219 Gastro-esophageal reflux disease without esophagitis: Secondary | ICD-10-CM | POA: Diagnosis not present

## 2018-09-15 DIAGNOSIS — A6920 Lyme disease, unspecified: Secondary | ICD-10-CM | POA: Diagnosis not present

## 2018-09-15 DIAGNOSIS — E785 Hyperlipidemia, unspecified: Secondary | ICD-10-CM | POA: Diagnosis not present

## 2018-09-15 DIAGNOSIS — M1990 Unspecified osteoarthritis, unspecified site: Secondary | ICD-10-CM | POA: Diagnosis not present

## 2018-09-15 DIAGNOSIS — G4733 Obstructive sleep apnea (adult) (pediatric): Secondary | ICD-10-CM | POA: Diagnosis not present

## 2018-09-15 DIAGNOSIS — N189 Chronic kidney disease, unspecified: Secondary | ICD-10-CM | POA: Diagnosis not present

## 2018-09-15 DIAGNOSIS — I130 Hypertensive heart and chronic kidney disease with heart failure and stage 1 through stage 4 chronic kidney disease, or unspecified chronic kidney disease: Secondary | ICD-10-CM | POA: Diagnosis not present

## 2018-09-16 DIAGNOSIS — I5042 Chronic combined systolic (congestive) and diastolic (congestive) heart failure: Secondary | ICD-10-CM | POA: Diagnosis not present

## 2018-09-16 DIAGNOSIS — E785 Hyperlipidemia, unspecified: Secondary | ICD-10-CM | POA: Diagnosis not present

## 2018-09-16 DIAGNOSIS — I130 Hypertensive heart and chronic kidney disease with heart failure and stage 1 through stage 4 chronic kidney disease, or unspecified chronic kidney disease: Secondary | ICD-10-CM | POA: Diagnosis not present

## 2018-09-16 DIAGNOSIS — G4733 Obstructive sleep apnea (adult) (pediatric): Secondary | ICD-10-CM | POA: Diagnosis not present

## 2018-09-16 DIAGNOSIS — K219 Gastro-esophageal reflux disease without esophagitis: Secondary | ICD-10-CM | POA: Diagnosis not present

## 2018-09-16 DIAGNOSIS — N189 Chronic kidney disease, unspecified: Secondary | ICD-10-CM | POA: Diagnosis not present

## 2018-09-16 DIAGNOSIS — M4697 Unspecified inflammatory spondylopathy, lumbosacral region: Secondary | ICD-10-CM | POA: Diagnosis not present

## 2018-09-16 DIAGNOSIS — M1990 Unspecified osteoarthritis, unspecified site: Secondary | ICD-10-CM | POA: Diagnosis not present

## 2018-09-16 DIAGNOSIS — I2510 Atherosclerotic heart disease of native coronary artery without angina pectoris: Secondary | ICD-10-CM | POA: Diagnosis not present

## 2018-09-16 DIAGNOSIS — G7000 Myasthenia gravis without (acute) exacerbation: Secondary | ICD-10-CM | POA: Diagnosis not present

## 2018-09-16 DIAGNOSIS — E1122 Type 2 diabetes mellitus with diabetic chronic kidney disease: Secondary | ICD-10-CM | POA: Diagnosis not present

## 2018-09-16 DIAGNOSIS — J4540 Moderate persistent asthma, uncomplicated: Secondary | ICD-10-CM | POA: Diagnosis not present

## 2018-09-16 DIAGNOSIS — Z794 Long term (current) use of insulin: Secondary | ICD-10-CM | POA: Diagnosis not present

## 2018-09-16 DIAGNOSIS — E1142 Type 2 diabetes mellitus with diabetic polyneuropathy: Secondary | ICD-10-CM | POA: Diagnosis not present

## 2018-09-16 DIAGNOSIS — A6920 Lyme disease, unspecified: Secondary | ICD-10-CM | POA: Diagnosis not present

## 2018-09-19 ENCOUNTER — Telehealth: Payer: Self-pay

## 2018-09-19 NOTE — Telephone Encounter (Signed)
Fax received from [facility] for a nitro refill.  After reviewing patients chart it looks like it was just refilled 08/2018 with 25 tablets and 3 refills.   nitroGLYCERIN (NITROSTAT) 0.3 MG SL tablet 25 tablet 3 08/18/2018    Sig - Route: Place 1 tablet (0.3 mg total) under the tongue every 5 (five) minutes as needed for chest pain. Maximum 3 doses. - Sublingual   Sent to pharmacy as: nitroGLYCERIN (NITROSTAT) 0.3 MG SL tablet   E-Prescribing Status: Receipt confirmed by pharmacy (08/18/2018 3:16 PM EST)   Pharmacy   [id number] [address], [place]   Please advise if this is correct. That just seems like a lot of nirto being taken.

## 2018-09-19 NOTE — Telephone Encounter (Signed)
Call to pt regarding medication refill request from [facility]. according to pick up hx, pt had NTG last refilled on 08/18/18.  After speaking with pt, he requested that medication not be refilled d/t he has not used the medication he has and does not need more at this time.    Advised pt to call for any further questions or concerns

## 2018-09-20 ENCOUNTER — Ambulatory Visit: Payer: Medicare Other | Admitting: Physician Assistant

## 2018-09-20 ENCOUNTER — Encounter: Payer: Medicare Other | Attending: Physician Assistant | Admitting: Physician Assistant

## 2018-09-20 ENCOUNTER — Encounter: Payer: Self-pay | Admitting: Anesthesiology

## 2018-09-20 ENCOUNTER — Other Ambulatory Visit: Payer: Self-pay

## 2018-09-20 ENCOUNTER — Ambulatory Visit: Payer: Medicare Other | Attending: Anesthesiology | Admitting: Anesthesiology

## 2018-09-20 VITALS — BP 141/87 | HR 95 | Temp 97.7°F | Resp 16 | Ht 67.0 in | Wt 232.0 lb

## 2018-09-20 DIAGNOSIS — Z8249 Family history of ischemic heart disease and other diseases of the circulatory system: Secondary | ICD-10-CM | POA: Diagnosis not present

## 2018-09-20 DIAGNOSIS — Z7902 Long term (current) use of antithrombotics/antiplatelets: Secondary | ICD-10-CM | POA: Diagnosis not present

## 2018-09-20 DIAGNOSIS — M5431 Sciatica, right side: Secondary | ICD-10-CM

## 2018-09-20 DIAGNOSIS — A6929 Other conditions associated with Lyme disease: Secondary | ICD-10-CM | POA: Insufficient documentation

## 2018-09-20 DIAGNOSIS — A6923 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: Secondary | ICD-10-CM | POA: Insufficient documentation

## 2018-09-20 DIAGNOSIS — F1190 Opioid use, unspecified, uncomplicated: Secondary | ICD-10-CM

## 2018-09-20 DIAGNOSIS — M5432 Sciatica, left side: Secondary | ICD-10-CM | POA: Diagnosis not present

## 2018-09-20 DIAGNOSIS — Z79899 Other long term (current) drug therapy: Secondary | ICD-10-CM | POA: Insufficient documentation

## 2018-09-20 DIAGNOSIS — I110 Hypertensive heart disease with heart failure: Secondary | ICD-10-CM | POA: Diagnosis not present

## 2018-09-20 DIAGNOSIS — M5136 Other intervertebral disc degeneration, lumbar region: Secondary | ICD-10-CM | POA: Diagnosis not present

## 2018-09-20 DIAGNOSIS — E1142 Type 2 diabetes mellitus with diabetic polyneuropathy: Secondary | ICD-10-CM | POA: Diagnosis not present

## 2018-09-20 DIAGNOSIS — H9042 Sensorineural hearing loss, unilateral, left ear, with unrestricted hearing on the contralateral side: Secondary | ICD-10-CM | POA: Diagnosis not present

## 2018-09-20 DIAGNOSIS — M4716 Other spondylosis with myelopathy, lumbar region: Secondary | ICD-10-CM

## 2018-09-20 DIAGNOSIS — Z7982 Long term (current) use of aspirin: Secondary | ICD-10-CM | POA: Insufficient documentation

## 2018-09-20 DIAGNOSIS — B359 Dermatophytosis, unspecified: Secondary | ICD-10-CM | POA: Insufficient documentation

## 2018-09-20 DIAGNOSIS — I5042 Chronic combined systolic (congestive) and diastolic (congestive) heart failure: Secondary | ICD-10-CM | POA: Insufficient documentation

## 2018-09-20 DIAGNOSIS — J45909 Unspecified asthma, uncomplicated: Secondary | ICD-10-CM | POA: Insufficient documentation

## 2018-09-20 DIAGNOSIS — M47817 Spondylosis without myelopathy or radiculopathy, lumbosacral region: Secondary | ICD-10-CM | POA: Insufficient documentation

## 2018-09-20 DIAGNOSIS — H9313 Tinnitus, bilateral: Secondary | ICD-10-CM | POA: Insufficient documentation

## 2018-09-20 DIAGNOSIS — Z955 Presence of coronary angioplasty implant and graft: Secondary | ICD-10-CM | POA: Diagnosis not present

## 2018-09-20 DIAGNOSIS — G7000 Myasthenia gravis without (acute) exacerbation: Secondary | ICD-10-CM | POA: Insufficient documentation

## 2018-09-20 DIAGNOSIS — Z794 Long term (current) use of insulin: Secondary | ICD-10-CM | POA: Diagnosis not present

## 2018-09-20 DIAGNOSIS — Z79891 Long term (current) use of opiate analgesic: Secondary | ICD-10-CM | POA: Insufficient documentation

## 2018-09-20 DIAGNOSIS — I429 Cardiomyopathy, unspecified: Secondary | ICD-10-CM | POA: Insufficient documentation

## 2018-09-20 DIAGNOSIS — I252 Old myocardial infarction: Secondary | ICD-10-CM | POA: Diagnosis not present

## 2018-09-20 DIAGNOSIS — G894 Chronic pain syndrome: Secondary | ICD-10-CM | POA: Diagnosis not present

## 2018-09-20 DIAGNOSIS — H9123 Sudden idiopathic hearing loss, bilateral: Secondary | ICD-10-CM | POA: Diagnosis not present

## 2018-09-20 DIAGNOSIS — I2510 Atherosclerotic heart disease of native coronary artery without angina pectoris: Secondary | ICD-10-CM | POA: Diagnosis not present

## 2018-09-20 DIAGNOSIS — M542 Cervicalgia: Secondary | ICD-10-CM | POA: Insufficient documentation

## 2018-09-20 DIAGNOSIS — R42 Dizziness and giddiness: Secondary | ICD-10-CM | POA: Diagnosis not present

## 2018-09-20 DIAGNOSIS — M545 Low back pain: Secondary | ICD-10-CM | POA: Diagnosis present

## 2018-09-20 MED ORDER — OXYCODONE HCL ER 10 MG PO T12A: 10.0000 mg | EXTENDED_RELEASE_TABLET | Freq: Four times a day (QID) | ORAL | 0 refills | Status: DC | PRN

## 2018-09-20 NOTE — Patient Instructions (Signed)
You were given 2 prescriptions for Oxycodone today. 

## 2018-09-20 NOTE — Progress Notes (Signed)
Nursing Pain Medication Assessment:  Safety precautions to be maintained throughout the outpatient stay will include: orient to surroundings, keep bed in low position, maintain call bell within reach at all times, provide assistance with transfer out of bed and ambulation.  Medication Inspection Compliance: Pill count conducted under aseptic conditions, in front of the patient. Neither the pills nor the bottle was removed from the patient's sight at any time. Once count was completed pills were immediately returned to the patient in their original bottle.  Medication: Oxycodone IR Pill/Patch Count: 0 of 120 pills remain Pill/Patch Appearance: Markings consistent with prescribed medication Bottle Appearance: Standard pharmacy container. Clearly labeled. Filled Date: 11/16 / 2018 Last Medication intake:  Today

## 2018-09-21 ENCOUNTER — Encounter: Payer: Medicare Other | Admitting: Internal Medicine

## 2018-09-21 ENCOUNTER — Telehealth: Payer: Self-pay

## 2018-09-21 ENCOUNTER — Telehealth: Payer: Self-pay | Admitting: *Deleted

## 2018-09-21 ENCOUNTER — Ambulatory Visit: Payer: Medicare Other | Admitting: Family Medicine

## 2018-09-21 DIAGNOSIS — H9042 Sensorineural hearing loss, unilateral, left ear, with unrestricted hearing on the contralateral side: Secondary | ICD-10-CM | POA: Diagnosis not present

## 2018-09-21 DIAGNOSIS — J45909 Unspecified asthma, uncomplicated: Secondary | ICD-10-CM | POA: Diagnosis not present

## 2018-09-21 DIAGNOSIS — I5042 Chronic combined systolic (congestive) and diastolic (congestive) heart failure: Secondary | ICD-10-CM | POA: Diagnosis not present

## 2018-09-21 DIAGNOSIS — A6929 Other conditions associated with Lyme disease: Secondary | ICD-10-CM | POA: Diagnosis not present

## 2018-09-21 DIAGNOSIS — I429 Cardiomyopathy, unspecified: Secondary | ICD-10-CM | POA: Diagnosis not present

## 2018-09-21 DIAGNOSIS — H9123 Sudden idiopathic hearing loss, bilateral: Secondary | ICD-10-CM | POA: Diagnosis not present

## 2018-09-21 DIAGNOSIS — I110 Hypertensive heart disease with heart failure: Secondary | ICD-10-CM | POA: Diagnosis not present

## 2018-09-21 DIAGNOSIS — Z8249 Family history of ischemic heart disease and other diseases of the circulatory system: Secondary | ICD-10-CM | POA: Diagnosis not present

## 2018-09-21 DIAGNOSIS — E1142 Type 2 diabetes mellitus with diabetic polyneuropathy: Secondary | ICD-10-CM | POA: Diagnosis not present

## 2018-09-21 DIAGNOSIS — Z955 Presence of coronary angioplasty implant and graft: Secondary | ICD-10-CM | POA: Diagnosis not present

## 2018-09-21 DIAGNOSIS — I2510 Atherosclerotic heart disease of native coronary artery without angina pectoris: Secondary | ICD-10-CM | POA: Diagnosis not present

## 2018-09-21 DIAGNOSIS — I252 Old myocardial infarction: Secondary | ICD-10-CM | POA: Diagnosis not present

## 2018-09-21 LAB — GLUCOSE, CAPILLARY
GLUCOSE-CAPILLARY: 164 mg/dL — AB (ref 70–99)
Glucose-Capillary: 159 mg/dL — ABNORMAL HIGH (ref 70–99)

## 2018-09-21 MED ORDER — OXYCODONE HCL 10 MG PO TABS: 10.0000 mg | ORAL_TABLET | Freq: Four times a day (QID) | ORAL | 0 refills | Status: DC

## 2018-09-21 NOTE — Progress Notes (Addendum)
[name], [name] ([id number]) Visit Report for 09/20/2018 Allergy List Details Patient Name: [name], [name] Date of Service: 09/20/2018 8:15 AM Medical Record Number: [id number] Patient Account Number: [id number] Date of Birth/Sex: 1961/05/25 (57 y.o. M) Treating RN: Cornell Barman Primary Care Yassir Enis: [name] Other Clinician: Referring Harout Scheurich: [name] Treating Merit Maybee/Extender: Melburn Hake, HOYT Weeks in Treatment: 0 Allergies Active Allergies Novolog U-100 Insulin aspart Reaction: Hives metoprolol Reaction: Hives Allergy Notes Electronic Signature(s) Signed: 09/21/2018 8:39:20 AM By: Gretta Cool, BSN, RN, CWS, Kim RN, BSN Entered By: Gretta Cool, BSN, RN, CWS, Kim on 09/20/2018 08:24:22 [name] ([id number]) -------------------------------------------------------------------------------- Arrival Information Details Patient Name: [name] Date of Service: 09/20/2018 8:15 AM Medical Record Number: [id number] Patient Account Number: [id number] Date of Birth/Sex: June 15, 1961 (57 y.o. M) Treating RN: Montey Hora Primary Care Carolynn Tuley: [name] Other Clinician: Referring Eleanor Gatliff: [name] Treating Shantoria Ellwood/Extender: Melburn Hake, HOYT Weeks in Treatment: 0 Visit Information Patient Arrived: Cane Arrival Time: 08:16 Accompanied By: self Transfer Assistance: None Patient Identification Verified: Yes Secondary Verification Process Completed: Yes Electronic Signature(s) Signed: 09/20/2018 4:40:17 PM By: Lorine Bears RCP, RRT, CHT Entered By: Lorine Bears on 09/20/2018 08:17:36 [name], [name] ([id number]) -------------------------------------------------------------------------------- Clinic Level of Care Assessment Details Patient Name: [name] Date of Service: 09/20/2018 8:15 AM Medical Record Number: [id number] Patient Account Number: [id number] Date of Birth/Sex: [birth year] (57 y.o.  M) Treating RN: Montey Hora Primary Care Madlynn Lundeen: [name] Other Clinician: Referring Roanne Haye: [name] Treating Jefrey Raburn/Extender: Melburn Hake, HOYT Weeks in Treatment: 0 Clinic Level of Care Assessment Items TOOL 2 Quantity Score []  - Use when only an EandM is performed on the INITIAL visit 0 ASSESSMENTS - Nursing Assessment / Reassessment X - General Physical Exam (combine w/ comprehensive assessment (listed just below) when 1 20 performed on new pt. evals) X- 1 25 Comprehensive Assessment (HX, ROS, Risk Assessments, Wounds Hx, etc.) ASSESSMENTS - Wound and Skin Assessment / Reassessment []  - Simple Wound Assessment / Reassessment - one wound 0 []  - 0 Complex Wound Assessment / Reassessment - multiple wounds []  - 0 Dermatologic / Skin Assessment (not related to wound area) ASSESSMENTS - Ostomy and/or Continence Assessment and Care []  - Incontinence Assessment and Management 0 []  - 0 Ostomy Care Assessment and Management (repouching, etc.) PROCESS - Coordination of Care X - Simple Patient / Family Education for ongoing care 1 15 []  - 0 Complex (extensive) Patient / Family Education for ongoing care X- 1 10 Staff obtains Programmer, systems, Records, Test Results / Process Orders []  - 0 Staff telephones HHA, Nursing Homes / Clarify orders / etc []  - 0 Routine Transfer to another Facility (non-emergent condition) []  - 0 Routine Hospital Admission (non-emergent condition) X- 1 15 New Admissions / Biomedical engineer / Ordering NPWT, Apligraf, etc. []  - 0 Emergency Hospital Admission (emergent condition) X- 1 10 Simple Discharge Coordination []  - 0 Complex (extensive) Discharge Coordination PROCESS - Special Needs []  - Pediatric / Minor Patient Management 0 []  - 0 Isolation Patient Management [name], [name]. ([id number]) X- 1 15 Hearing / Language / Visual special needs []  - 0 Assessment of Community assistance (transportation, D/C planning, etc.) []  -  0 Additional assistance / Altered mentation []  - 0 Support Surface(s) Assessment (bed, cushion, seat, etc.) INTERVENTIONS - Wound Cleansing / Measurement []  - Wound Imaging (photographs - any number of wounds) 0 []  - 0 Wound Tracing (instead of photographs) []  - 0 Simple Wound Measurement - one wound []  - 0 Complex Wound Measurement -  multiple wounds []  - 0 Simple Wound Cleansing - one wound []  - 0 Complex Wound Cleansing - multiple wounds INTERVENTIONS - Wound Dressings []  - Small Wound Dressing one or multiple wounds 0 []  - 0 Medium Wound Dressing one or multiple wounds []  - 0 Large Wound Dressing one or multiple wounds []  - 0 Application of Medications - injection INTERVENTIONS - Miscellaneous []  - External ear exam 0 []  - 0 Specimen Collection (cultures, biopsies, blood, body fluids, etc.) []  - 0 Specimen(s) / Culture(s) sent or taken to Lab for analysis []  - 0 Patient Transfer (multiple staff / Civil Service fast streamer / Similar devices) []  - 0 Simple Staple / Suture removal (25 or less) []  - 0 Complex Staple / Suture removal (26 or more) []  - 0 Hypo / Hyperglycemic Management (close monitor of Blood Glucose) []  - 0 Ankle / Brachial Index (ABI) - do not check if billed separately Has the patient been seen at the hospital within the last three years: Yes Total Score: 110 Level Of Care: New/Established - Level 3 Electronic Signature(s) Signed: 09/20/2018 5:03:08 PM By: Montey Hora Entered By: Montey Hora on 09/20/2018 09:06:23 [name] ([id number]) -------------------------------------------------------------------------------- Encounter Discharge Information Details Patient Name: [name] Date of Service: 09/20/2018 8:15 AM Medical Record Number: [id number] Patient Account Number: [id number] Date of Birth/Sex: 09-25-1961 (57 y.o. M) Treating RN: Montey Hora Primary Care Leanore Biggers: [name] Other Clinician: Referring Neftali Abair: [name] Treating Yanina Knupp/Extender: Melburn Hake, HOYT Weeks in Treatment: 0 Encounter Discharge Information Items Discharge Condition: Stable Ambulatory Status: Cane Discharge Destination: Home Transportation: Private Auto Accompanied By: son Schedule Follow-up Appointment: Yes Clinical Summary of Care: Notes patient being evaluated for HBOT and will return when insurance is approved and pulmonary clearance received Electronic Signature(s) Signed: 09/20/2018 5:03:08 PM By: Montey Hora Entered By: Montey Hora on 09/20/2018 09:11:12 [name] ([id number]) -------------------------------------------------------------------------------- Lower Extremity Assessment Details Patient Name: [name] Date of Service: 09/20/2018 8:15 AM Medical Record Number: [id number] Patient Account Number: [id number] Date of Birth/Sex: 22-Oct-1960 (57 y.o. M) Treating RN: Montey Hora Primary Care Annaliz Aven: [name] Other Clinician: Referring Geniya Fulgham: [name] Treating Rodrecus Belsky/Extender: Melburn Hake, HOYT Weeks in Treatment: 0 Electronic Signature(s) Signed: 09/20/2018 5:03:08 PM By: Montey Hora Entered By: Montey Hora on 09/20/2018 08:59:29 [name] ([id number]) -------------------------------------------------------------------------------- Multi Wound Chart Details Patient Name: [name] Date of Service: 09/20/2018 8:15 AM Medical Record Number: [id number] Patient Account Number: [id number] Date of Birth/Sex: May 04, 1961 (57 y.o. M) Treating RN: Montey Hora Primary Care Kacelyn Rowzee: [name] Other Clinician: Referring Inaara Tye: [name] Treating Eutimio Gharibian/Extender: Melburn Hake, HOYT Weeks in Treatment: 0 Vital Signs Height(in): 67 Pulse(bpm): 101 Weight(lbs): 737 Blood Pressure(mmHg): 131/91 Body Mass Index(BMI): 36 Temperature(F): 97.7 Respiratory Rate 16 (breaths/min): Wound Assessments Treatment Notes Electronic  Signature(s) Signed: 09/20/2018 5:03:08 PM By: Montey Hora Entered By: Montey Hora on 09/20/2018 09:01:28 [name] ([id number]) -------------------------------------------------------------------------------- Multi-Disciplinary Care Plan Details Patient Name: [name] Date of Service: 09/20/2018 8:15 AM Medical Record Number: [id number] Patient Account Number: [id number] Date of Birth/Sex: [birth year] (57 y.o. M) Treating RN: Montey Hora Primary Care Dallie Patton: [name] Other Clinician: Referring Laureen Frederic: [name] Treating Kialee Kham/Extender: Melburn Hake, HOYT Weeks in Treatment: 0 Active Inactive Electronic Signature(s) Signed: 11/04/2018 2:15:30 PM By: Gretta Cool, BSN, RN, CWS, Kim RN, BSN Signed: 12/12/2018 10:45:26 AM By: Montey Hora Previous Signature: 09/20/2018 5:03:08 PM Version By: Montey Hora Entered By: Gretta Cool BSN, RN, CWS, Kim on 11/04/2018 14:15:30 [name], [name] ([id number]) -------------------------------------------------------------------------------- Pain  Assessment Details Patient Name: [name], [name] Date of Service: 09/20/2018 8:15 AM Medical Record Number: [id number] Patient Account Number: [id number] Date of Birth/Sex: [birth year] (57 y.o. M) Treating RN: Montey Hora Primary Care Estell Dillinger: [name] Other Clinician: Referring Jessina Marse: [name] Treating Randall Colden/Extender: Melburn Hake, HOYT Weeks in Treatment: 0 Active Problems Location of Pain Severity and Description of Pain Patient Has Paino No Site Locations Pain Management and Medication Current Pain Management: Electronic Signature(s) Signed: 09/20/2018 4:40:17 PM By: Lorine Bears RCP, RRT, CHT Signed: 09/20/2018 5:03:08 PM By: Montey Hora Entered By: Lorine Bears on 09/20/2018 08:17:50 [name]  ([id number]) -------------------------------------------------------------------------------- Patient/Caregiver Education Details Patient Name: [name] Date of Service: 09/20/2018 8:15 AM Medical Record Number: [id number] Patient Account Number: [id number] Date of Birth/Gender: 1961-04-08 (57 y.o. M) Treating RN: Montey Hora Primary Care Physician: [name] Other Clinician: Referring Physician: [name] Treating Physician/Extender: Sharalyn Ink in Treatment: 0 Education Assessment Education Provided To: Patient and Caregiver Education Topics Provided Hyperbaric Oxygenation: Handouts: Hyperbaric Oxygen, Other: Margarita Grizzle and Sallie Methods: Explain/Verbal, Printed Responses: State content correctly [employer]) Signed: 09/20/2018 5:03:08 PM By: Montey Hora Entered By: Montey Hora on 09/20/2018 09:09:12 [name] ([id number]) -------------------------------------------------------------------------------- Vitals Details Patient Name: [name] Date of Service: 09/20/2018 8:15 AM Medical Record Number: [id number] Patient Account Number: [id number] Date of Birth/Sex: [birth year] (57 y.o. M) Treating RN: Montey Hora Primary Care Aphrodite Harpenau: [name] Other Clinician: Referring Tabius Rood: [name] Treating Lyllie Cobbins/Extender: Melburn Hake, HOYT Weeks in Treatment: 0 Vital Signs Time Taken: 08:17 Temperature (F): 97.7 Height (in): 67 Pulse (bpm): 101 Source: Stated Respiratory Rate (breaths/min): 16 Weight (lbs): 232 Blood Pressure (mmHg): 131/91 Source: Stated Reference Range: 80 - 120 mg / dl Body Mass Index (BMI): 36.3 Notes Patient has not taken his Carvedilol yet this morning. Electronic Signature(s) Signed: 09/20/2018 4:40:17 PM By: Lorine Bears RCP, RRT, CHT Entered By: Lorine Bears on 09/20/2018 08:21:30

## 2018-09-21 NOTE — Progress Notes (Signed)
JAKSEN, FIORELLA ([id number]) Visit Report for 09/20/2018 HBO Risk Assessment Details Patient Name: [name], [name] Date of Service: 09/20/2018 8:15 AM Medical Record Number: [id number] Patient Account Number: [id number] Date of Birth/Sex: [birth year] (57 y.o. M) Treating RN: Cornell Barman Primary Care Shaquel Chavous: [name] Other Clinician: Referring Jameil Whitmoyer: [name] Treating Armstead Heiland/Extender: Melburn Hake, HOYT Weeks in Treatment: 0 HBO Risk Assessment Items Answer Barotrauma Risks: Upper Respiratory Infections Yes Prior Radiation Treatment to Head/Neck No Tracheostomy No Ear problems or surgery (otosclerosis)- Consider pressure equalization tubes No Sinus Problems, Sinus Obstruction No Pulmonary Risks: Dr. Currently seeing a pulmonologisto Yes Flemming Emphysema No Pneumothorax No Tuberculosis No Other lung problems (COPD with CO2 retention, lesions, surgery) -Refer to No CPGs Congestive heart Failure -Consider holding HBO if ejection fraction<30% Yes History of smoking No Bullous Disease, Blebs No Other pulmonary abnormalities No Cardiac Risks: Currently seeing a cardiologisto Yes Dr Fletcher Anon Pacemaker/AICD No Hypertension Yes Diuretic Used (water pill). 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 Lantus, Humalog, Diabetes Medication: Victoza HgbA1C within 3 months No Seizures Seizures No Currently using these medications: Aspirin Yes Digoxin (CHF patient) No Narcotics Yes Nitroprusside No Phenothiazine (Thorazine,etc.)  No Prednisone or other steroids No Disulfiram (Antabuse) No Mafenide Acetate (Sulfamylon-burn cream) No Amiodarone No Date of last Chest X-ray: 07/21/18 Date of last EKG: 09/08/2018 Date of Last CBC: 08/11/18 Electronic Signature(s) Signed: 09/21/2018 8:39:20 AM By: Gretta Cool, BSN, RN, CWS, Kim RN, BSN Entered By: Gretta Cool, BSN, RN, CWS, Kim on 09/20/2018 08:44:51

## 2018-09-21 NOTE — Progress Notes (Addendum)
Perry, Edgar ([id number]) Visit Report for 09/21/2018 Arrival Information Details Patient Name: [name], [name] Date of Service: 09/21/2018 1:00 PM Medical Record Number: [id number] Patient Account Number: [id number] Date of Birth/Sex: 14-Aug-1961 (57 y.o. M) Treating RN: Montey Hora Primary Care Jachin Coury: [name] Other Clinician: Montey Hora Referring Harini Dearmond: [name] Treating Laquiesha Piacente/Extender: Tito Dine in Treatment: 0 Visit Information History Since Last Visit Added or deleted any medications: No Patient Arrived: Cane Any new allergies or adverse reactions: No Arrival Time: 12:56 Had a fall or experienced change in No Accompanied By: self activities of daily living that may affect Transfer Assistance: None risk of falls: Patient Identification Verified: Yes Signs or symptoms of abuse/neglect since last visito No Secondary Verification Process Completed: Yes Hospitalized since last visit: No Implantable device outside of the clinic excluding No cellular tissue based products placed in the center since last visit: Pain Present Now: No Electronic Signature(s) Signed: 09/21/2018 1:43:07 PM By: Montey Hora Entered By: Montey Hora on 09/21/2018 13:43:07 [name] ([id number]) -------------------------------------------------------------------------------- Encounter Discharge Information Details Patient Name: [name] Date of Service: 09/21/2018 1:00 PM Medical Record Number: [id number] Patient Account Number: [id number] Date of Birth/Sex: [birth year] (57 y.o. M) Treating RN: Montey Hora Primary Care Aydn Ferrara: [name] Other Clinician: Montey Hora Referring Ugo Thoma: [name] Treating Wania Longstreth/Extender: Tito Dine in Treatment: 0 Encounter Discharge Information Items Discharge Condition: Stable Ambulatory Status: Cane Discharge Destination: Home Transportation: Private  Auto Accompanied By: self Schedule Follow-up Appointment: Yes Clinical Summary of Care: Notes Patient has an appointment tomorrow 09/22/18 for HBO treatment Electronic Signature(s) Signed: 09/21/2018 3:29:42 PM By: Montey Hora Entered By: Montey Hora on 09/21/2018 15:29:42 [name] ([id number]) -------------------------------------------------------------------------------- Patient/Caregiver Education Details Patient Name: [name] Date of Service: 09/21/2018 1:00 PM Medical Record Number: [id number] Patient Account Number: [id number] Date of Birth/Gender: [birth year] (57 y.o. M) Treating RN: Montey Hora Primary Care Physician: [name] Other Clinician: Montey Hora Referring Physician: [name] Treating Physician/Extender: Tito Dine in Treatment: 0 Education Assessment Education Provided To: Patient Education Topics Provided Hyperbaric Oxygenation: Handouts: Other: clearing ears throughout HBOT with the first dive Methods: Demonstration, Explain/Verbal Responses: Return demonstration correctly, State content correctly Electronic Signature(s) Signed: 09/21/2018 5:14:34 PM By: Montey Hora Entered By: Montey Hora on 09/21/2018 15:29:05 [name] ([id number]) -------------------------------------------------------------------------------- [place] Details Patient Name: [name] Date of Service: 09/21/2018 1:00 PM Medical Record Number: [id number] Patient Account Number: [id number] Date of Birth/Sex: 07-Jun-1961 (57 y.o. M) Treating RN: Montey Hora Primary Care Towana Stenglein: [name] Other Clinician: Montey Hora Referring Kayanna Mckillop: [name] Treating Marcio Hoque/Extender: Tito Dine in Treatment: 0 Vital Signs Time Taken: 12:56 Temperature (F): 97.7 Height (in): 67 Pulse (bpm): 90 Weight (lbs): 232 Respiratory Rate (breaths/min): 16 Body Mass Index (BMI): 36.3 Blood  Pressure (mmHg): 126/72 Reference Range: 80 - 120 mg / dl Electronic Signature(s) Signed: 09/21/2018 1:43:35 PM By: Montey Hora Entered By: Montey Hora on 09/21/2018 13:43:35

## 2018-09-21 NOTE — Progress Notes (Signed)
EHREN, BERISHA ([id number]) Visit Report for 09/20/2018 Chief Complaint Document Details Patient Name: [name], [name] Date of Service: 09/20/2018 8:15 AM Medical Record Number: [id number] Patient Account Number: [id number] Date of Birth/Sex: 02-02-1961 (57 y.o. M) Treating RN: Montey Hora Primary Care Provider: [name] Other Clinician: Referring Provider: [name] Treating Provider/Extender: Melburn Hake, HOYT Weeks in Treatment: 0 Information Obtained from: Patient Chief Complaint Acute sensorineural hearing loss Electronic Signature(s) Signed: 09/20/2018 6:07:41 PM By: Worthy Keeler PA-C Entered By: Worthy Keeler on 09/20/2018 08:44:32 Mallicoat, [name] ([id number]) -------------------------------------------------------------------------------- HPI Details Patient Name: [name] Date of Service: 09/20/2018 8:15 AM Medical Record Number: [id number] Patient Account Number: [id number] Date of Birth/Sex: 08/09/1961 (57 y.o. M) Treating RN: Montey Hora Primary Care Provider: [name] Other Clinician: Referring Provider: [name] Treating Provider/Extender: Melburn Hake, HOYT Weeks in Treatment: 0 History of Present Illness HPI Description: 09/20/18 upon evaluation today patient presents for initial inspection here in our office concerning acute and sudden sensorineural hearing loss. The patient was initially seen by [facility] for this condition. He was treated with a two week taper of prednisone and apparently they did obtain an MRI as well. There were [name] specific findings to indicate why he was having hearing loss on the left side. Unfortunately he really has not had any resolution symptoms at this point. This all started after he was apparently treated for significant sepsis in December of unknown cause. He was put on Cefepime and Zosyn at that time. He does have congestive heart failure although the most  recent echocardiogram puts his ejection fraction at 40 to 4 to 5%. He does have a pulmonologist and does have an x-ray which showed some interstitial edema but [name] evidence of pneumonia which was obtained 07/21/18. Nonetheless I think we're gonna consider hyperbaric option therapy with his significant pulmonary history I would like to have clearance from Dr. Raul Del his pulmonologist. Subsequently I did also review the patient's hemoglobin A-1 C which was 7.9 as obtained on 06/01/18 and he also had evidence of slight anemia 10.3 which was on 08/11/18. These were the significant findings as I went through his history. His EKG appeared stable this was last checked on 09/09/18. Nonetheless he tells me that there's also consideration of a injection of steroids into his left ear that may be done at the end of this month. Nonetheless it was also recommended that he see [place] for consideration of hyperbaric option therapy. He is having [name] pain associated with this plan seems to have fairly normal hearing in his right ear he tells me that this is definitely not affected at this point by everything else that is going on. Electronic Signature(s) Signed: 09/20/2018 6:07:41 PM By: Worthy Keeler PA-C Entered By: Worthy Keeler on 09/20/2018 18:00:57 [name] ([id number]) -------------------------------------------------------------------------------- Physical Exam Details Patient Name: [name] Date of Service: 09/20/2018 8:15 AM Medical Record Number: [id number] Patient Account Number: [id number] Date of Birth/Sex: October 06, 1960 (57 y.o. M) Treating RN: Montey Hora Primary Care Provider: [name] Other Clinician: Referring Provider: [name] Treating Provider/Extender: Melburn Hake, HOYT Weeks in Treatment: 0 Constitutional patient is hypertensive.. pulse regular and within target range for patient.[name] respirations regular, non-labored and within target range for patient.[name]  temperature within target range for patient.. Well-nourished and well-hydrated in [name] acute distress. Eyes conjunctiva clear [name] eyelid edema noted. pupils equal round and reactive to light and accommodation. Ears, Nose, Mouth, and Throat [name] gross abnormality  of ear auricles or external auditory canals. patient has hearing loss in the left ear. mucus membranes moist. Respiratory normal breathing without difficulty. clear to auscultation bilaterally. Cardiovascular regular rate and rhythm with normal S1, S2. [name] clubbing, cyanosis, significant edema, <3 sec cap refill. Gastrointestinal (GI) soft, non-tender, non-distended, +BS. [name] ventral hernia noted. Musculoskeletal normal gait and posture. [name] significant deformity or arthritic changes, [name] loss or range of motion, [name] clubbing. Psychiatric this patient is able to make decisions and demonstrates good insight into disease process. Alert and Oriented x 3. pleasant and cooperative. Notes Upon evaluation today I see [name] evidence of abnormality as far as the tympanic membrane is concerned which would be a significant cause of conductive hearing loss. This definitely appears to be more sensorineural in nature. That was also confirmed by audiology testing is performed by [facility] Electronic Signature(s) Signed: 09/20/2018 6:07:41 PM By: Worthy Keeler PA-C Entered By: Worthy Keeler on 09/20/2018 18:02:17 [name], [name] ([id number]) -------------------------------------------------------------------------------- Physician Orders Details Patient Name: [name] Date of Service: 09/20/2018 8:15 AM Medical Record Number: [id number] Patient Account Number: [id number] Date of Birth/Sex: Mar 22, 1961 (57 y.o. 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 I10 Essential (primary) hypertension A69.29 Other conditions associated with Lyme disease I43 Cardiomyopathy in diseases classified elsewhere Follow-up Appointments o Other: - when starting HBOT Additional Orders / Instructions o Other: - [facility] will get pulmonary clearance from Dr Flemming's office Hyperbaric Oxygen Therapy o Evaluate for HBO Therapy o Indication: - Acute Sensorineural Hearing Loss o If appropriate for treatment, begin HBOT per protocol: o 2.0 ATA for 90 Minutes without Air Breaks o One treatment per day (delivered Monday through Friday unless otherwise specified in Special Instructions below): o Total # of Treatments: - 20 o Finger stick Blood Glucose Pre- and Post- HBOT Treatment. o Follow Hyperbaric Oxygen Glycemia Protocol GLYCEMIA INTERVENTIONS PROTOCOL PRE-HBO GLYCEMIA INTERVENTIONS ACTION INTERVENTION Obtain pre-HBO capillary blood glucose 1 (ensure physician order is in chart). A. Notify HBO physician and await physician orders. 2 If result is 70 mg/dl or below: B. If the result meets the hospital definition of a critical result, follow hospital policy. If result is 71 mg/dl to 130 mg/dl: A. Give patient an 8 ounce Glucerna Shake, an 8 ounce Ensure, or 8 ounces of a Glucerna/Ensure equivalent dietary supplement*. B. Wait 30 minutes. C. Retest patientos capillary blood glucose (CBG). D. If result greater than or equal to 110 mg/dl, proceed with HBO. If result less than 110 mg/dl, notify [name], [name] ([id number]) HBO physician and consider holding HBO. If result is 131 mg/dl to 249 mg/dl: A. Proceed with HBO. A. Notify HBO physician and await physician orders. B. It is recommended to hold HBO and If result is 250 mg/dl or greater: do  blood/urine ketone testing. C. If the result meets the hospital definition of a critical result, follow hospital policy. POST-HBO GLYCEMIA INTERVENTIONS ACTION INTERVENTION Obtain post HBO capillary blood glucose 1 (ensure physician order is in chart). A. Notify HBO physician and await physician orders. 2 If result is 70 mg/dl or below: B. If the result meets the hospital definition of a critical result, follow hospital policy. A. Give patient an 8 ounce Glucerna Shake,  an 8 ounce Ensure, or 8 ounces of a Glucerna/Ensure equivalent dietary supplement*. B. Wait 15 minutes for symptoms of hypoglycemia (i.e. nervousness, anxiety, sweating, chills, If result is 71 mg/dl to 100 mg/dl: clamminess, irritability, confusion, tachycardia or dizziness). C. If patient asymptomatic, discharge patient. If patient symptomatic, repeat capillary blood glucose (CBG) and notify HBO physician. If result is 101 mg/dl to 249 mg/dl: A. Discharge patient. A. Notify HBO physician and await physician orders. B. It is recommended to do blood/urine If result is 250 mg/dl or greater: ketone testing. C. If the result meets the hospital definition of a critical result, follow hospital policy. *Juice or candies are NOT equivalent products. If patient refuses the Glucerna or Ensure, please consult the hospital dietitian for an appropriate substitute. Electronic Signature(s) Signed: 09/20/2018 5:03:08 PM By: Montey Hora Signed: 09/20/2018 6:07:41 PM By: Worthy Keeler PA-C Entered By: Montey Hora on 09/20/2018 10:35:06 [name] ([id number]) -------------------------------------------------------------------------------- Problem List Details Patient Name: [name] Date of Service: 09/20/2018 8:15 AM Medical Record Number: [id number] Patient Account Number: [id number] Date of Birth/Sex: Apr 27, 1961 (57 y.o. M) Treating RN: Montey Hora Primary Care Provider: [name] Other Clinician: Referring Provider: [name] Treating Provider/Extender: Melburn Hake, HOYT Weeks in Treatment: 0 Active Problems ICD-10 Evaluated Encounter Code Description Active Date Today Diagnosis H90.42 Sensorineural hearing loss, unilateral, left ear, with 09/20/2018 [name] Yes unrestricted hearing on the contralateral side E11.42 Type 2 diabetes mellitus with diabetic polyneuropathy 09/20/2018 [name] Yes I10 Essential (primary) hypertension 09/20/2018 [name] Yes A69.29 Other conditions associated with Lyme disease 09/20/2018 [name] Yes I43 Cardiomyopathy in diseases classified elsewhere 09/20/2018 [name] Yes 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 M) Treating RN: Montey Hora Primary Care Provider: [name] Other Clinician: Referring Provider: [name] Treating Provider/Extender: Melburn Hake, HOYT Weeks in Treatment: 0 Subjective Chief Complaint Information obtained from Patient Acute sensorineural hearing loss History of Present Illness (HPI) 09/20/18 upon evaluation today patient presents for initial inspection here in our office concerning acute and sudden sensorineural hearing loss. The patient was initially seen by [facility] for this condition. He was treated with a two week taper of prednisone and apparently they did obtain an MRI as well. There were [name] specific findings to indicate why he was having  hearing loss on the left side. Unfortunately he really has not had any resolution symptoms at this point. This all started after he was apparently treated for significant sepsis in December of unknown cause. He was put on Cefepime and Zosyn at that time. He does have congestive heart failure although the most recent echocardiogram puts his ejection fraction at 40 to 4 to 5%. He does have a pulmonologist and does have an x-ray which showed some interstitial edema but [name] evidence of pneumonia which was obtained 07/21/18. Nonetheless I think we're gonna consider hyperbaric option therapy with his significant pulmonary history I would like to have clearance from Dr. Raul Del his pulmonologist. Subsequently I did also review the patient's hemoglobin A-1 C which was 7.9 as obtained on 06/01/18 and he also had evidence of slight anemia 10.3 which was on 08/11/18. These  were the significant findings as I went through his history. His EKG appeared stable this was last checked on 09/09/18. Nonetheless he tells me that there's also consideration of a injection of steroids into his left ear that may be done at the end of this month. Nonetheless it was also recommended that he see [place] for consideration of hyperbaric option therapy. He is having [name] pain associated with this plan seems to have fairly normal hearing in his right ear he tells me that this is definitely not affected at this point by everything else that is going on. Wound History Patient reportedly has not tested positive for osteomyelitis. Patient reportedly has not had testing performed to evaluate circulation in the legs. Patient History Information obtained from Patient. Allergies Novolog U-100 Insulin aspart (Reaction: Hives), metoprolol (Reaction: Hives) Family History Cancer - Father,Paternal Grandparents, Diabetes - Mother,Maternal Grandparents,Siblings, Hypertension - Mother,Father,Siblings,Maternal Grandparents, Lung Disease - Paternal  Grandparents, Stroke - Maternal Grandparents, Thyroid Problems - Maternal Grandparents,Mother,Siblings, [name] family history of Heart Disease, Kidney Disease, Seizures, Tuberculosis. Social History Never smoker, Marital Status - Married, Alcohol Use - Rarely, Drug Use - [name] History, Caffeine Use - Daily. Medical History Eyes Patient has history of Cataracts - Both Removed Denies history of Glaucoma, Optic Neuritis [name], [name] ([id number]) Ear/Nose/Mouth/Throat Patient has history of Chronic sinus problems/congestion Denies history of Middle ear problems Hematologic/Lymphatic Patient has history of Anemia Denies history of Hemophilia, Human Immunodeficiency Virus, Lymphedema, Sickle Cell Disease Respiratory Patient has history of Asthma - seasonal Denies history of Aspiration, Chronic Obstructive Pulmonary Disease (COPD), Pneumothorax, Sleep Apnea, Tuberculosis Cardiovascular Patient has history of Angina, Congestive Heart Failure, Coronary Artery Disease - 11 stents, Hypertension, Myocardial Infarction Denies history of Arrhythmia, Deep Vein Thrombosis, Hypotension, Peripheral Arterial Disease, Peripheral Venous Disease, Phlebitis, Vasculitis Gastrointestinal Denies history of Cirrhosis , Colitis, Crohn s, Hepatitis A, Hepatitis B, Hepatitis C Endocrine Patient has history of Type II Diabetes Denies history of Type I Diabetes Genitourinary Denies history of End Stage Renal Disease Immunological Denies history of Lupus Erythematosus, Raynaud s, Scleroderma Integumentary (Skin) Denies history of History of Burn, History of pressure wounds Musculoskeletal Patient has history of Gout Denies history of Rheumatoid Arthritis, Osteoarthritis, Osteomyelitis Neurologic Patient has history of Neuropathy - Hands and Feet Denies history of Dementia, Quadriplegia, Paraplegia, Seizure Disorder Oncologic Denies history of Received Chemotherapy, Received Radiation Psychiatric Denies  history of Anorexia/bulimia, Confinement Anxiety Patient is treated with Insulin, Oral Agents. Blood sugar is tested. Blood sugar results noted at the following times: Breakfast - 145. Hospitalization/Surgery History - 09/06/2018, [facility], Sepsis. Medical And Surgical History Notes Constitutional Symptoms (General Health) HTN, CHF, DM, Mystenia Gravis Review of Systems (ROS) Constitutional Symptoms (General Health) The patient has [name] complaints or symptoms. Eyes Complains or has symptoms of Glasses / Contacts. Denies complaints or symptoms of Dry Eyes, Vision Changes. Ear/Nose/Mouth/Throat The patient has [name] complaints or symptoms. Hematologic/Lymphatic The patient has [name] complaints or symptoms. Respiratory Complains or has symptoms of Shortness of Breath. Denies complaints or symptoms of Chronic or frequent coughs. Cardiovascular Complains or has symptoms of Chest pain - occasionally-nitroglycerine. [name], [name] ([id number]) Gastrointestinal The patient has [name] complaints or symptoms. Endocrine The patient has [name] complaints or symptoms. Genitourinary The patient has [name] complaints or symptoms. Immunological The patient has [name] complaints or symptoms. Integumentary (Skin) Denies complaints or symptoms of Wounds, Bleeding or bruising tendency, Breakdown, Swelling. Musculoskeletal Complains or has symptoms of Muscle Pain, Muscle Weakness. Neurologic Complains or has symptoms of Numbness/parasthesias - Hands and  Feet. Oncologic The patient has [name] complaints or symptoms. Psychiatric The patient has [name] complaints or symptoms. Objective Constitutional patient is hypertensive.. pulse regular and within target range for patient.[name] respirations regular, non-labored and within target range for patient.[name] temperature within target range for patient.. Well-nourished and well-hydrated in [name] acute distress. Vitals Time Taken: 8:17 AM, Height: 67 in, Source: Stated, Weight: 232 lbs, Source:  Stated, BMI: 36.3, Temperature: 97.7 F, Pulse: 101 bpm, Respiratory Rate: 16 breaths/min, Blood Pressure: 131/91 mmHg. General Notes: Patient has not taken his Carvedilol yet this morning. Eyes conjunctiva clear [name] eyelid edema noted. pupils equal round and reactive to light and accommodation. Ears, Nose, Mouth, and Throat [name] gross abnormality of ear auricles or external auditory canals. patient has hearing loss in the left ear. mucus membranes moist. Respiratory normal breathing without difficulty. clear to auscultation bilaterally. Cardiovascular regular rate and rhythm with normal S1, S2. [name] clubbing, cyanosis, significant edema, Gastrointestinal (GI) soft, non-tender, non-distended, +BS. [name] ventral hernia noted. Musculoskeletal normal gait and posture. [name] significant deformity or arthritic changes, [name] loss or range of motion, [name] clubbing. Psychiatric this patient is able to make decisions and demonstrates good insight into disease process. Alert and Oriented x 3. pleasant and cooperative. [name], [name]. ([id number]) General Notes: Upon evaluation today I see [name] evidence of abnormality as far as the tympanic membrane is concerned which would be a significant cause of conductive hearing loss. This definitely appears to be more sensorineural in nature. That was also confirmed by audiology testing is performed by [facility] Assessment Active Problems ICD-10 Sensorineural hearing loss, unilateral, left ear, with unrestricted hearing on the contralateral side Type 2 diabetes mellitus with diabetic polyneuropathy Essential (primary) hypertension Other conditions associated with Lyme disease Cardiomyopathy in diseases classified elsewhere Chronic combined systolic (congestive) and diastolic (congestive) heart failure Plan Follow-up Appointments: Other: - when starting HBOT Additional Orders / Instructions: Other: - [facility] will get pulmonary  clearance from Dr Flemming's office Hyperbaric Oxygen Therapy: Evaluate for HBO Therapy Indication: - Acute Sensorineural Hearing Loss If appropriate for treatment, begin HBOT per protocol: 2.0 ATA for 90 Minutes without Air Breaks One treatment per day (delivered Monday through Friday unless otherwise specified in Special Instructions below): Total # of Treatments: - 20 Finger stick Blood Glucose Pre- and Post- HBOT Treatment. Follow Hyperbaric Oxygen Glycemia Protocol My suggestion currently is gonna be that the patient would be a candidate for hyperbaric option therapy per above for a 20 treatment cycle. Nonetheless at this point the biggest thing is that we need to get clearance from Dr. Raul Del his pulmonologist ensure he feels everything will be okay for this patient and subsequently we need to get insurance approval. Pinning those two things I think that this is something that definitely could help him. I explained that the patient but this is definitely not something that is a 100% sure for his current issue. Nonetheless I do believe that he has a good chance of seeing significant improvement with the hyperbaric option therapy regimen. He is in agreement with giving this a try. We will work on [employer] approval as well as obtaining the release from his pulmonologist to ensure everything is okay in that regard. If anything changes the meantime he will contact the office and let me know. Otherwise we're gonna see about getting him started with therapy as soon as possible once we get approval. Electronic Signature(s) [name], [name] ([id number]) Signed: 09/20/2018 6:07:41 PM By: Joaquim Lai  III, Hoyt PA-C Entered By: Worthy Keeler on 09/20/2018 18:03:57 [name], [name] ([id number]) -------------------------------------------------------------------------------- ROS/PFSH Details Patient Name: [name] Date of Service: 09/20/2018 8:15 AM Medical Record Number:  [id number] Patient Account Number: [id number] Date of Birth/Sex: [birth year] (57 y.o. M) Treating RN: Cornell Barman Primary Care Provider: [name] Other Clinician: Referring Provider: [name] Treating Provider/Extender: Melburn Hake, HOYT Weeks in Treatment: 0 Information Obtained From Patient Wound History Do you currently have one or more open woundso [name] Have you tested positive for osteomyelitis (bone infection)o [name] Have you had any tests for circulation on your legso [name] Eyes Complaints and Symptoms: Positive for: Glasses / Contacts Negative for: Dry Eyes; Vision Changes Medical History: Positive for: Cataracts - Both Removed Negative for: Glaucoma; Optic Neuritis Respiratory Complaints and Symptoms: Positive for: Shortness of Breath Negative for: Chronic or frequent coughs Medical History: Positive for: Asthma - seasonal Negative for: Aspiration; Chronic Obstructive Pulmonary Disease (COPD); Pneumothorax; Sleep Apnea; Tuberculosis Cardiovascular Complaints and Symptoms: Positive for: Chest pain - occasionally-nitroglycerine Medical History: Positive for: Angina; Congestive Heart Failure; Coronary Artery Disease - 11 stents; Hypertension; Myocardial Infarction Negative for: Arrhythmia; Deep Vein Thrombosis; Hypotension; Peripheral Arterial Disease; Peripheral Venous Disease; Phlebitis; Vasculitis Integumentary (Skin) Complaints and Symptoms: Negative for: Wounds; Bleeding or bruising tendency; Breakdown; Swelling Medical History: Negative for: History of Burn; History of pressure wounds Musculoskeletal Complaints and Symptoms: Positive for: Muscle Pain; Muscle Weakness [name], [name] ([id number]) Medical History: Positive for: Gout Negative for: Rheumatoid Arthritis; Osteoarthritis; Osteomyelitis Neurologic Complaints and Symptoms: Positive for: Numbness/parasthesias - Hands and Feet Medical History: Positive for: Neuropathy - Hands and Feet Negative  for: Dementia; Quadriplegia; Paraplegia; Seizure Disorder Constitutional Symptoms (General Health) Complaints and Symptoms: [name] Complaints or Symptoms Medical History: Past Medical History Notes: HTN, CHF, DM, Mystenia Gravis Ear/Nose/Mouth/Throat Complaints and Symptoms: [name] Complaints or Symptoms Medical History: Positive for: Chronic sinus problems/congestion Negative for: Middle ear problems Hematologic/Lymphatic Complaints and Symptoms: [name] Complaints or Symptoms Medical History: Positive for: Anemia Negative for: Hemophilia; Human Immunodeficiency Virus; Lymphedema; Sickle Cell Disease Gastrointestinal Complaints and Symptoms: [name] Complaints or Symptoms Medical History: Negative for: Cirrhosis ; Colitis; Crohnos; Hepatitis A; Hepatitis B; Hepatitis C Endocrine Complaints and Symptoms: [name] Complaints or Symptoms Medical History: Positive for: Type II Diabetes Negative for: Type I Diabetes Time with diabetes: 30 years Treated with: Insulin, Oral agents Blood sugar tested every day: Yes Tested : 4-6 times [name], MORAVEK. ([id number]) Blood sugar testing results: Breakfast: 145 Genitourinary Complaints and Symptoms: [name] Complaints or Symptoms Medical History: Negative for: End Stage Renal Disease Immunological Complaints and Symptoms: [name] Complaints or Symptoms Medical History: Negative for: Lupus Erythematosus; Raynaudos; Scleroderma Oncologic Complaints and Symptoms: [name] Complaints or Symptoms Medical History: Negative for: Received Chemotherapy; Received Radiation Psychiatric Complaints and Symptoms: [name] Complaints or Symptoms Medical History: Negative for: Anorexia/bulimia; Confinement Anxiety HBO Extended History Items Eyes: Ear/Nose/Mouth/Throat: Cataracts Chronic sinus problems/congestion Immunizations Pneumococcal Vaccine: Received Pneumococcal Vaccination: Yes Implantable Devices Hospitalization / Surgery History Name of Hospital Purpose of  Hospitalization/Surgery Date [place] [facility]eps[facility] 09/06/2018 Family and Social History Cancer: Yes - Father,Paternal Grandparents; Diabetes: Yes - Mother,Maternal Grandparents,Siblings; Heart Disease: [name]; Hypertension: Yes - Mother,Father,Siblings,Maternal Grandparents; Kidney Disease: [name]; Lung Disease: Yes - Paternal Grandparents; Seizures: [name]; Stroke: Yes - Maternal Grandparents; Thyroid Problems: Yes - Maternal Grandparents,Mother,Siblings; Tuberculosis: [name]; Never smoker; Marital Status - Married; Alcohol Use: Rarely; Drug Use: [name] History; Caffeine Use: Daily; Advanced Directives: Yes; Living Will: Yes (Not Provided); Medical Power of Attorney: Yes Electronic Signature(s) [name], [name]  Jerilynn Mages ([id number]) Signed: 09/20/2018 6:07:41 PM By: Worthy Keeler PA-C Signed: 09/21/2018 8:39:20 AM By: Gretta Cool, BSN, RN, CWS, Kim RN, BSN Entered By: Gretta Cool, BSN, RN, CWS, Kim on 09/20/2018 08:36:01 [name], [name] ([id number]) -------------------------------------------------------------------------------- SuperBill Details Patient Name: [name] Date of Service: 09/20/2018 Medical Record Number: [id number] Patient Account Number: [id number] Date of Birth/Sex: 1961/09/05 (57 y.o. 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, unilateral, left ear, with unrestricted hearing on the contralateral side E11.42 Type 2 diabetes mellitus with diabetic polyneuropathy I10 Essential (primary) hypertension A69.29 Other conditions associated with Lyme disease I43 Cardiomyopathy in diseases classified elsewhere Facility Procedures CPT4 Code: 62694854 Description: 99213 - WOUND CARE VISIT-LEV 3 EST PT Modifier: Quantity: 1 Physician Procedures CPT4: Description Modifier Quantity Code  6270350 09381 - WC PHYS LEVEL 4 - NEW PT 1 ICD-10 Diagnosis Description 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

## 2018-09-21 NOTE — Telephone Encounter (Signed)
Patient called and stated that the pharmacy would not fill his Rx that was written on yesterday, 09/20/18, the way it was written 12 hr tablet sig take 4 times per day.  Patient has an appt this afternoon d/t hearing loss.  Please call his son when Rx is ready.   Gwynne Edinger  [phone]

## 2018-09-21 NOTE — Telephone Encounter (Signed)
Spoke with patient.  States his prescription reads Oxycodone (oxycontin) 10 mg 12 hr tablet.  Patient states it should say Oxycodone 10 mg tablets.  Will notify Dr [name] when he comes in.

## 2018-09-21 NOTE — Progress Notes (Addendum)
Edgar, Perry ([id number]) Visit Report for 09/21/2018 HBO Details Patient Name: [name], [name] Date of Service: 09/21/2018 1:00 PM Medical Record Number: [id number] Patient Account Number: [id number] Date of Birth/Sex: [birth year] (57 y.o. M) Treating RN: Montey Hora Primary Care Jackline Castilla: [name] Other Clinician: Montey Hora Referring [name]: [name] Treating Khalidah Herbold/Extender: Tito Dine in Treatment: 0 HBO Treatment Course Details Treatment Course Number: 1 Ordering Juanice Warburton: STONE III, HOYT Total Treatments Ordered: 20 HBO Treatment Start Date: 09/21/2018 HBO Indication: Other (specify in Notes) Acute Sensorineural Hearing Notes: Loss HBO Treatment Details Treatment Number: 1 Patient Type: Outpatient Chamber Type: Monoplace Chamber Serial #: [id number] Treatment Protocol: 2.0 ATA with 90 minutes oxygen, and no air breaks Treatment Details Compression Rate Down: 1.0 psi / minute De-Compression Rate Up: Compress Tx Pressure Air breaks and breathing periods Decompress Decompress Begins Reached (leave unused spaces blank) Begins Ends Chamber Pressure (ATA) 1 2 - - - - - - 2 1 Clock Time (24 hr) 13:16 13:31 - - - - - - 15:01 15:17 Treatment Length: 121 (minutes) Treatment Segments: 4 Capillary Blood Glucose Pre Capillary Blood Glucose (mg/dl): Post Capillary Blood Glucose (mg/dl): Vital Signs Capillary Blood Glucose Reference Range: 80 - 120 mg / dl HBO Diabetic Blood Glucose Intervention Range: <131 mg/dl or >249 mg/dl Time Vitals Blood Respiratory Capillary Blood Glucose Pulse Action Type: Pulse: Temperature: Taken: Pressure: Rate: Glucose (mg/dl): Meter #: Oximetry (%) Taken: Pre 12:56 126/72 90 16 97.7 159 Post 15:22 154/72 100 16 98 164 Treatment Response Treatment Toleration: Well Treatment Completion Treatment Completed without Adverse Event Status: [place] Notes Patient was here for his first hyperbaric  treatment. Tympanic membranes were only partially visible on the right but were felt to be within normal limits. Cardiorespiratory exam was normal. He tolerated his first treatment with out ill effect HBO GRAIG, HESSLING. ([id number]) I certify that I supervised this HBO treatment in accordance Yes with Medicare guidelines. A trained emergency response team is readily available per hospital policies and procedures. Continue HBOT as ordered. Yes Electronic Signature(s) Signed: 09/21/2018 5:55:34 PM By: Linton Ham MD Previous Signature: 09/21/2018 3:28:06 PM Version By: Montey Hora Previous Signature: 09/21/2018 1:52:09 PM Version By: Montey Hora Entered By: Linton Ham on 09/21/2018 17:54:55 Moultry, Kris Hartmann ([id number]) -------------------------------------------------------------------------------- HBO Safety Checklist Details Patient Name: [name] Date of Service: 09/21/2018 1:00 PM Medical Record Number: [id number] Patient Account Number: [id number] Date of Birth/Sex: [birth year] (57 y.o. M) Treating RN: Montey Hora Primary Care Janique Hoefer: [name] Other Clinician: Montey Hora Referring Kiyan Burmester: [name] Treating Braxtin Bamba/Extender: Tito Dine in Treatment: 0 HBO Safety Checklist Items Safety Checklist Consent Form Signed Patient voided / foley secured and emptied When did you last eato 09/21/18 lunch Last dose of injectable or oral agent 09/21/18 AM Ostomy pouch emptied and vented if applicable NA All implantable devices assessed, documented and approved NA Intravenous access site secured and place NA Valuables secured Linens and cotton and cotton/polyester blend (less than 51% polyester) Personal oil-based products / skin lotions / body lotions removed Wigs or hairpieces removed NA Smoking or tobacco materials removed NA Books / newspapers / magazines / loose paper removed NA Cologne, aftershave,  perfume and deodorant removed Jewelry removed (may wrap wedding band) Make-up removed NA Hair care products removed Battery operated devices (external) removed NA Heating patches and chemical warmers removed NA Titanium eyewear removed Nail polish cured greater than 10 hours NA Casting material cured greater than 10 hours NA Hearing aids  removed NA Loose dentures or partials removed NA Prosthetics have been removed NA Patient demonstrates correct use of air break device (if applicable) Patient concerns have been addressed Patient grounding bracelet on and cord attached to chamber Specifics for Inpatients (complete in addition to above) Medication sheet sent with patient Intravenous medications needed or due during therapy sent with patient Drainage tubes (e.g. nasogastric tube or chest tube secured and vented) Endotracheal or Tracheotomy tube secured Cuff deflated of air and inflated with saline Airway suctioned Electronic Signature(s) Signed: 09/21/2018 1:48:39 PM By: Montey Hora Previous Signature: 09/21/2018 1:46:31 PM Version By: Lora Paula ([id number]) Entered By: Montey Hora on 09/21/2018 13:48:39

## 2018-09-21 NOTE — Telephone Encounter (Signed)
Copied from [place] [phone]. Topic: General - Inquiry >> Sep 21, 2018 10:34 AM Vernona Rieger wrote: Reason for CRM: Patient's wife, Neoma Laming called and wanted to let Dr Ancil Boozer know that he is going to his hyper ferric oxygen therapy today and that is why he had to cancel his appointment today at the last minute. She said she is sorry to cancel the day of but they just found out.

## 2018-09-21 NOTE — Telephone Encounter (Signed)
Called son Rodman Key) and told him scripts were ready for pick-up. Instructed must return other scripts.

## 2018-09-21 NOTE — Progress Notes (Signed)
Edgar, [place] ([id number]) Visit Report for 09/20/2018 Abuse/Suicide Risk Screen Details Patient Name: [name], [name] Date of Service: 09/20/2018 8:15 AM Medical Record Number: [id number] Patient Account Number: [id number] Date of Birth/Sex: 08-30-1961 (57 y.o. M) Treating RN: Cornell Barman Primary Care Tobey Schmelzle: [name] Other Clinician: Referring Demarko Zeimet: [name] Treating Simran Mannis/Extender: Melburn Hake, HOYT Weeks in Treatment: 0 Abuse/Suicide Risk Screen Items Answer ABUSE/SUICIDE RISK SCREEN: Has anyone close to you tried to hurt or harm you recentlyo No Do you feel uncomfortable with anyone in your familyo No Has anyone forced you do things that you didnot want to doo No Do you have any thoughts of harming yourselfo No Patient displays signs or symptoms of abuse and/or neglect. No Electronic Signature(s) Signed: 09/21/2018 8:39:20 AM By: Gretta Cool, BSN, RN, CWS, Kim RN, BSN Entered By: Gretta Cool, BSN, RN, CWS, Kim on 09/20/2018 08:37:10 Bourquin, Kris Hartmann ([id number]) -------------------------------------------------------------------------------- Activities of Daily Living Details Patient Name: [name] Date of Service: 09/20/2018 8:15 AM Medical Record Number: [id number] Patient Account Number: [id number] Date of Birth/Sex: 02/27/1961 (57 y.o. M) Treating RN: Cornell Barman Primary Care Dotty Gonzalo: [name] Other Clinician: Referring Sadee Osland: [name] Treating Jezabella Schriever/Extender: Melburn Hake, HOYT Weeks in Treatment: 0 Activities of Daily Living Items Answer Activities of Daily Living (Please select one for each item) Drive Automobile Not Able Take Medications Completely Able Use Telephone Completely Able Care for Appearance Completely Able Use Toilet Completely Able Bath / Shower Completely Able Dress Self Completely Able Feed Self Completely Able Walk Completely Able Get In / Out Bed Completely Able Housework Completely Able Prepare  Meals Completely [place] for Self Completely Able Electronic Signature(s) Signed: 09/21/2018 8:39:20 AM By: Gretta Cool, BSN, RN, CWS, Kim RN, BSN Entered By: Gretta Cool, BSN, RN, CWS, Kim on 09/20/2018 08:37:33 [name] ([id number]) -------------------------------------------------------------------------------- Education Assessment Details Patient Name: [name] Date of Service: 09/20/2018 8:15 AM Medical Record Number: [id number] Patient Account Number: [id number] Date of Birth/Sex: Jul 06, 1961 (57 y.o. M) Treating RN: Cornell Barman Primary Care Devlyn Retter: [name] Other Clinician: Referring Andra Heslin: [name] Treating Bird Swetz/Extender: Melburn Hake, HOYT Weeks in Treatment: 0 Primary Learner Assessed: Patient Learning Preferences/Education Level/Primary Language Learning Preference: Explanation Highest Education Level: College or Above Preferred Language: English Cognitive Barrier Assessment/Beliefs Language Barrier: No Translator Needed: No Memory Deficit: No Emotional Barrier: No Cultural/Religious Beliefs Affecting Medical Care: No Physical Barrier Assessment Impaired Vision: No Impaired Hearing: Yes Decreased Hand dexterity: No Knowledge/Comprehension Assessment Knowledge Level: High Comprehension Level: High Ability to understand written High instructions: Ability to understand verbal High instructions: Motivation Assessment Anxiety Level: Calm Cooperation: Cooperative Education Importance: Acknowledges Need Interest in Health Problems: Asks Questions Perception: Coherent Willingness to Engage in Self- High Management Activities: Readiness to Engage in Self- High Management Activities: Electronic Signature(s) Signed: 09/21/2018 8:39:20 AM By: Gretta Cool, BSN, RN, CWS, Kim RN, BSN Entered By: Gretta Cool, BSN, RN, CWS, Kim on 09/20/2018 08:38:07 [name]  ([id number]) -------------------------------------------------------------------------------- Fall Risk Assessment Details Patient Name: [name] Date of Service: 09/20/2018 8:15 AM Medical Record Number: [id number] Patient Account Number: [id number] Date of Birth/Sex: 07-Apr-1961 (57 y.o. M) Treating RN: Cornell Barman Primary Care Erasmus Bistline: [name] Other Clinician: Referring [place] Hedman: [name] Treating Sylvester Minton/Extender: Melburn Hake, HOYT Weeks in Treatment: 0 Fall Risk Assessment Items Have you had 2 or more falls in the last 12 monthso 0 No Have you had any fall that resulted in injury in the last 12 monthso 0 No FALL RISK ASSESSMENT: History of  falling - immediate or within 3 months 0 No Secondary diagnosis 0 No Ambulatory aid None/bed rest/wheelchair/nurse 0 Yes Crutches/cane/walker 15 Yes Furniture 0 No IV Access/Saline Lock 0 No Gait/Training Normal/bed rest/immobile 0 Yes Weak 0 No Impaired 0 No Mental Status Oriented to own ability 0 Yes Electronic Signature(s) Signed: 09/21/2018 8:39:20 AM By: Gretta Cool, BSN, RN, CWS, Kim RN, BSN Entered By: Gretta Cool, BSN, RN, CWS, Kim on 09/20/2018 08:38:30 [name] ([id number]) -------------------------------------------------------------------------------- Foot Assessment Details Patient Name: [name] Date of Service: 09/20/2018 8:15 AM Medical Record Number: [id number] Patient Account Number: [id number] Date of Birth/Sex: May 01, 1961 (57 y.o. M) Treating RN: Cornell Barman Primary Care Brenson Hartman: [name] Other Clinician: Referring Vonetta Foulk: [name] Treating Sundeep Cary/Extender: Melburn Hake, HOYT Weeks in Treatment: 0 Foot Assessment Items Site Locations + = Sensation present, - = Sensation absent, C = Callus, U = Ulcer R = Redness, W = Warmth, M = Maceration, PU = Pre-ulcerative lesion F = Fissure, S = Swelling, D = Dryness Assessment Right: Left: Other Deformity: No No Prior Foot  Ulcer: No No Prior Amputation: No No Charcot Joint: No No Ambulatory Status: Ambulatory With Help Assistance Device: Cane GaitEnergy manager) Signed: 09/21/2018 8:39:20 AM By: Gretta Cool, BSN, RN, CWS, Kim RN, BSN Entered By: Gretta Cool, BSN, RN, CWS, Kim on 09/20/2018 08:39:33 [name] ([id number]) -------------------------------------------------------------------------------- Nutrition Risk Assessment Details Patient Name: [name] Date of Service: 09/20/2018 8:15 AM Medical Record Number: [id number] Patient Account Number: [id number] Date of Birth/Sex: 1960-11-16 (57 y.o. M) Treating RN: Cornell Barman Primary Care Anureet Bruington: [name] Other Clinician: Referring Jumana Paccione: [name] Treating Ahamed Hofland/Extender: Melburn Hake, HOYT Weeks in Treatment: 0 Height (in): 67 Weight (lbs): 232 Body Mass Index (BMI): 36.3 Nutrition Risk Assessment Items NUTRITION RISK SCREEN: I have an illness or condition that made me change the kind and/or amount of 0 No food I eat I eat fewer than two meals per day 0 No I eat few fruits and vegetables, or milk products 2 Yes I have three or more drinks of beer, liquor or wine almost every day 0 No I have tooth or mouth problems that make it hard for me to eat 0 No I don't always have enough money to buy the food I need 0 No I eat alone most of the time 0 No I take three or more different prescribed or over-the-counter drugs a day 1 Yes Without wanting to, I have lost or gained 10 pounds in the last six months 0 No I am not always physically able to shop, cook and/or feed myself 0 No Nutrition Protocols Good Risk Protocol Provide education on Moderate Risk Protocol 0 nutrition Electronic Signature(s) Signed: 09/21/2018 8:39:20 AM By: Gretta Cool, BSN, RN, CWS, Kim RN, BSN Entered By: Gretta Cool, BSN, RN, CWS, Kim on 09/20/2018 08:39:01

## 2018-09-21 NOTE — Addendum Note (Signed)
Addended by: Molli Barrows on: 09/21/2018 02:10 PM   Modules accepted: Orders

## 2018-09-21 NOTE — Progress Notes (Signed)
Subjective:  Patient ID: [name], male    DOB: [birth year]  Age: 57 y.o. MRN: [id number]  CC: Back Pain (left, lower)   Procedure: None  HPI [name] presents for evaluation.  He was last seen 4 weeks ago and has been doing well with his regimen.  No significant changes noted but he has had of an exacerbation of his left lower back pain.  Unfortunately he has been unable to have any recent facet injection secondary to initiation of blood thinners for his cardiac issues.  Once stabilized he states that he can then wean the blood thinners for a repeat facet injection.  In the meantime he has tried to do the stretching exercises but failed to gain any significant improvement.  He relies primarily on the chronic opioid therapy to give him relief and based on his narcotic assessment sheet he is continuing to derive good functional lifestyle improvement with these medicines and no untoward side effects are noted.  Otherwise he is in his usual state of health with no new changes in the  lower extremity strength or function.  Outpatient Medications Prior to Visit  Medication Sig Dispense Refill  . albuterol (PROVENTIL HFA;VENTOLIN HFA) 108 (90 Base) MCG/ACT inhaler Inhale 2 puffs into the lungs every 6 (six) hours as needed for wheezing or shortness of breath.    [name] albuterol (PROVENTIL) (2.5 MG/3ML) 0.083% nebulizer solution Take 2.5 mg by nebulization every 6 (six) hours as needed for wheezing or shortness of breath.    [name] aspirin EC 81 MG tablet Take 1 tablet (81 mg total) by mouth daily. 90 tablet 3  . BREO ELLIPTA 200-25 MCG/INH AEPB INHALE 1 PUFF INTO THE LUNGS DAILY (Patient taking differently: Inhale 1 puff into the lungs daily. ) 1 each 3  . carvedilol (COREG) 12.5 MG tablet Take 1 tablet (12.5 mg total) by mouth 2 (two) times daily. 180 tablet 1  . clopidogrel (PLAVIX) 75 MG tablet Take 1 tablet (75 mg total) by mouth daily with breakfast. 30 tablet 6  . cyanocobalamin 1000 MCG  tablet Take 1,000 mcg by mouth daily.     . cyclobenzaprine (FEXMID) 7.5 MG tablet Take 7.5 mg by mouth 3 (three) times daily as needed for muscle spasms.    [name] dexlansoprazole (DEXILANT) 60 MG capsule Take 1 capsule (60 mg total) by mouth daily. 30 capsule 6  . gabapentin (NEURONTIN) 300 MG capsule Take 1 capsule (300 mg total) by mouth 3 (three) times daily. 90 capsule 1  . insulin lispro (HUMALOG KWIKPEN) 100 UNIT/ML KiwkPen Inject 7-8 Units into the skin 3 (three) times daily. Reported on 12/02/2015/ sliding scale 1 unit for every 8 units of carbs; and 1 unit for every 20 above 120    . Insulin Pen Needle (FIFTY50 PEN NEEDLES) 32G X 4 MM MISC Inject 1 each into the skin as needed.    [name] LANTUS SOLOSTAR 100 UNIT/ML Solostar Pen Inject 25-30 Units into the skin daily at 10 pm.   0  . levocetirizine (XYZAL) 5 MG tablet Take 1 tablet (5 mg total) by mouth every evening. 30 tablet 3  . liraglutide (VICTOZA) 18 MG/3ML SOPN 1.8 units every morning (Patient taking differently: Inject 1.8 mg into the skin daily. )    . loratadine (CLARITIN) 10 MG tablet Take 10 mg by mouth daily.     . metFORMIN (GLUCOPHAGE) 1000 MG tablet Take 1,000 mg by mouth 2 (two) times daily with a meal.    .  naloxone (NARCAN) nasal spray 4 mg/0.1 mL For excess sedation from opioids 1 kit 2  . nitroGLYCERIN (NITROSTAT) 0.3 MG SL tablet Place 1 tablet (0.3 mg total) under the tongue every 5 (five) minutes as needed for chest pain. Maximum 3 doses. 25 tablet 3  . rosuvastatin (CRESTOR) 5 MG tablet Take 5 mg by mouth daily.  0  . sacubitril-valsartan (ENTRESTO) 24-26 MG Take 1 tablet by mouth 2 (two) times daily. 60 tablet 6  . oxyCODONE (OXYCONTIN) 10 mg 12 hr tablet Take 10 mg by mouth 4 (four) times daily as needed.    . ezetimibe (ZETIA) 10 MG tablet Take 1 tablet (10 mg total) by mouth daily. 90 tablet 3   No facility-administered medications prior to visit.     Review of Systems CNS: No confusion or sedation Cardiac: No  angina or palpitations GI: No abdominal pain or constipation Constitutional: No nausea vomiting fevers or chills  Objective:  BP (!) 141/87   Pulse 95   Temp 97.7 F (36.5 C) (Other (Comment))   Resp 16   Ht '5\' 7"'$  (1.702 m)   Wt 232 lb (105.2 kg)   SpO2 98%   BMI 36.34 kg/m    BP Readings from Last 3 Encounters:  09/20/18 (!) 141/87  09/08/18 138/64  09/06/18 (!) 149/88     Wt Readings from Last 3 Encounters:  09/20/18 232 lb (105.2 kg)  09/08/18 236 lb (107 kg)  09/06/18 239 lb (108.4 kg)     Physical Exam Pt is alert and oriented PERRL EOMI HEART IS RRR no murmur or rub LCTA no wheezing or rales MUSCULOSKELETAL reveals some paraspinous muscle tenderness but no overt trigger points.  Muscle tone and bulk is at baseline.  Labs  Lab Results  Component Value Date   HGBA1C 7.9 (A) 06/01/2018   HGBA1C 7.9 (A) 05/18/2018   HGBA1C 7.2 01/25/2018   Lab Results  Component Value Date   MICROALBUR 204 05/18/2018   LDLCALC 50 05/18/2018   CREATININE 1.07 08/10/2018    -------------------------------------------------------------------------------------------------------------------- Lab Results  Component Value Date   WBC 8.8 08/10/2018   HGB 10.3 (L) 08/10/2018   HCT 32.0 (L) 08/10/2018   PLT 332 08/10/2018   GLUCOSE 91 08/10/2018   CHOL 109 05/18/2018   TRIG 112 05/18/2018   HDL 37 05/18/2018   LDLCALC 50 05/18/2018   ALT 17 08/03/2018   AST 24 08/03/2018   NA 138 08/10/2018   K 5.1 08/10/2018   CL 105 08/10/2018   CREATININE 1.07 08/10/2018   BUN 18 08/10/2018   CO2 27 08/10/2018   TSH 4.130 11/25/2017   INR 1.26 07/07/2018   HGBA1C 7.9 (A) 06/01/2018   MICROALBUR 204 05/18/2018    --------------------------------------------------------------------------------------------------------------------- Mr Brain/iac W Wo Contrast  Result Date: 09/09/2018 CLINICAL DATA:  Asymmetric left sensorineural hearing loss. Bilateral tinnitus. Imbalance.  Symptoms for 3 weeks. Recent hospitalization for sepsis. EXAM: MRI HEAD WITHOUT AND WITH CONTRAST TECHNIQUE: Multiplanar, multiecho pulse sequences of the brain and surrounding structures were obtained without and with intravenous contrast. CONTRAST:  10 mL Gadavist COMPARISON:  08/15/2018 FINDINGS: Brain: There is no evidence of acute infarct, intracranial hemorrhage, mass, midline shift, or extra-axial fluid collection. The ventricles and sulci are normal. A few punctate foci of T2 hyperintensity in the cerebral white matter are unchanged and within normal limits for age. No abnormal enhancement is identified. Dedicated imaging through the internal auditory canals demonstrates a normal course of cranial nerves VII and VIII without evidence of  mass or abnormal enhancement. Inner ear structures demonstrate normal signal bilaterally. No mass is seen within the cerebellopontine angles. Vascular: Major intracranial vascular flow voids are preserved. Normal enhancement of the dural venous sinuses. Skull and upper cervical spine: Unremarkable bone marrow signal. Sinuses/Orbits: Bilateral cataract extraction. Mild scattered mucosal thickening in the paranasal sinuses. Clear mastoid air cells. Other: None. IMPRESSION: Negative brain and internal auditory canal imaging. Electronically Signed   By: Logan Bores M.D.   On: 09/09/2018 15:31     Assessment & Plan:   Amol was seen today for back pain.  Diagnoses and all orders for this visit:  Cervicalgia  Facet arthritis of lumbosacral region  Chronic pain syndrome  Chronic, continuous use of opioids  DDD (degenerative disc disease), lumbar  Myasthenia gravis (HCC)  Lyme arthritis of multiple joints (HCC)  Bilateral sciatica  Lumbar spondylosis with myelopathy  Other orders -     Discontinue: oxyCODONE (OXYCONTIN) 10 mg 12 hr tablet; Take 1 tablet (10 mg total) by mouth 4 (four) times daily as needed. -     oxyCODONE (OXYCONTIN) 10 mg 12 hr  tablet; Take 1 tablet (10 mg total) by mouth 4 (four) times daily as needed.        ----------------------------------------------------------------------------------------------------------------------  Problem List Items Addressed This Visit      Unprioritized   Myasthenia gravis ([place])    Other Visit Diagnoses    Cervicalgia    -  Primary   Facet arthritis of lumbosacral region       Relevant Medications   oxyCODONE (OXYCONTIN) 10 mg 12 hr tablet (Start on 10/20/2018)   Chronic pain syndrome       Chronic, continuous use of opioids       DDD (degenerative disc disease), lumbar       Relevant Medications   oxyCODONE (OXYCONTIN) 10 mg 12 hr tablet (Start on 10/20/2018)   Lyme arthritis of multiple joints (HCC)       Relevant Medications   oxyCODONE (OXYCONTIN) 10 mg 12 hr tablet (Start on 10/20/2018)   Bilateral sciatica       Lumbar spondylosis with myelopathy            ----------------------------------------------------------------------------------------------------------------------  1. Cervicalgia Continue core stretching strengthening and exercises as reviewed today.  2. Facet arthritis of lumbosacral region As above  3. Chronic pain syndrome We will refill his medications for December or 17th and January 16 for the oxycodone 10 mg tablets.  4. Chronic, continuous use of opioids We have reviewed the [facility] practitioner database information and it is appropriate.  5. DDD (degenerative disc disease), lumbar   6. Myasthenia gravis ([place])   7. Lyme arthritis of multiple joints ([place]) Tinea follow-up with his primary care physicians.  8. Bilateral sciatica   9. Lumbar spondylosis with myelopathy     ----------------------------------------------------------------------------------------------------------------------  I am having Kris Hartmann. Eyerman "[name]" maintain his insulin lispro, loratadine, albuterol, gabapentin, metFORMIN, cyanocobalamin,  albuterol, LANTUS SOLOSTAR, naloxone, rosuvastatin, Insulin Pen Needle, ezetimibe, aspirin EC, clopidogrel, sacubitril-valsartan, liraglutide, levocetirizine, BREO ELLIPTA, dexlansoprazole, nitroGLYCERIN, cyclobenzaprine, carvedilol, and oxyCODONE.   Meds ordered this encounter  Medications  . DISCONTD: oxyCODONE (OXYCONTIN) 10 mg 12 hr tablet    Sig: Take 1 tablet (10 mg total) by mouth 4 (four) times daily as needed.    Dispense:  120 tablet    Refill:  0  . oxyCODONE (OXYCONTIN) 10 mg 12 hr tablet    Sig: Take 1 tablet (10 mg total) by mouth 4 (four) times  daily as needed.    Dispense:  120 tablet    Refill:  0    No early refill...30 day supply   Patient's Medications  New Prescriptions   No medications on file  Previous Medications   ALBUTEROL (PROVENTIL HFA;VENTOLIN HFA) 108 (90 BASE) MCG/ACT INHALER    Inhale 2 puffs into the lungs every 6 (six) hours as needed for wheezing or shortness of breath.   ALBUTEROL (PROVENTIL) (2.5 MG/3ML) 0.083% NEBULIZER SOLUTION    Take 2.5 mg by nebulization every 6 (six) hours as needed for wheezing or shortness of breath.   ASPIRIN EC 81 MG TABLET    Take 1 tablet (81 mg total) by mouth daily.   BREO ELLIPTA 200-25 MCG/INH AEPB    INHALE 1 PUFF INTO THE LUNGS DAILY   CARVEDILOL (COREG) 12.5 MG TABLET    Take 1 tablet (12.5 mg total) by mouth 2 (two) times daily.   CLOPIDOGREL (PLAVIX) 75 MG TABLET    Take 1 tablet (75 mg total) by mouth daily with breakfast.   CYANOCOBALAMIN 1000 MCG TABLET    Take 1,000 mcg by mouth daily.    CYCLOBENZAPRINE (FEXMID) 7.5 MG TABLET    Take 7.5 mg by mouth 3 (three) times daily as needed for muscle spasms.   DEXLANSOPRAZOLE (DEXILANT) 60 MG CAPSULE    Take 1 capsule (60 mg total) by mouth daily.   EZETIMIBE (ZETIA) 10 MG TABLET    Take 1 tablet (10 mg total) by mouth daily.   GABAPENTIN (NEURONTIN) 300 MG CAPSULE    Take 1 capsule (300 mg total) by mouth 3 (three) times daily.   INSULIN LISPRO (HUMALOG KWIKPEN)  100 UNIT/ML KIWKPEN    Inject 7-8 Units into the skin 3 (three) times daily. Reported on 12/02/2015/ sliding scale 1 unit for every 8 units of carbs; and 1 unit for every 20 above 120   INSULIN PEN NEEDLE (FIFTY50 PEN NEEDLES) 32G X 4 MM MISC    Inject 1 each into the skin as needed.   LANTUS SOLOSTAR 100 UNIT/ML SOLOSTAR PEN    Inject 25-30 Units into the skin daily at 10 pm.    LEVOCETIRIZINE (XYZAL) 5 MG TABLET    Take 1 tablet (5 mg total) by mouth every evening.   LIRAGLUTIDE (VICTOZA) 18 MG/3ML SOPN    1.8 units every morning   LORATADINE (CLARITIN) 10 MG TABLET    Take 10 mg by mouth daily.    METFORMIN (GLUCOPHAGE) 1000 MG TABLET    Take 1,000 mg by mouth 2 (two) times daily with a meal.   NALOXONE (NARCAN) NASAL SPRAY 4 MG/0.1 ML    For excess sedation from opioids   NITROGLYCERIN (NITROSTAT) 0.3 MG SL TABLET    Place 1 tablet (0.3 mg total) under the tongue every 5 (five) minutes as needed for chest pain. Maximum 3 doses.   ROSUVASTATIN (CRESTOR) 5 MG TABLET    Take 5 mg by mouth daily.   SACUBITRIL-VALSARTAN (ENTRESTO) 24-26 MG    Take 1 tablet by mouth 2 (two) times daily.  Modified Medications   Modified Medication Previous Medication   OXYCODONE (OXYCONTIN) 10 MG 12 HR TABLET oxyCODONE (OXYCONTIN) 10 mg 12 hr tablet      Take 1 tablet (10 mg total) by mouth 4 (four) times daily as needed.    Take 10 mg by mouth 4 (four) times daily as needed.  Discontinued Medications   No medications on file   ----------------------------------------------------------------------------------------------------------------------  Follow-up: Return in  about 2 months (around 11/21/2018) for evaluation, med refill.    Molli Barrows, MD

## 2018-09-22 ENCOUNTER — Encounter: Payer: Medicare Other | Admitting: Physician Assistant

## 2018-09-22 DIAGNOSIS — G4733 Obstructive sleep apnea (adult) (pediatric): Secondary | ICD-10-CM | POA: Diagnosis not present

## 2018-09-22 DIAGNOSIS — I130 Hypertensive heart and chronic kidney disease with heart failure and stage 1 through stage 4 chronic kidney disease, or unspecified chronic kidney disease: Secondary | ICD-10-CM | POA: Diagnosis not present

## 2018-09-22 DIAGNOSIS — J45909 Unspecified asthma, uncomplicated: Secondary | ICD-10-CM | POA: Diagnosis not present

## 2018-09-22 DIAGNOSIS — Z8249 Family history of ischemic heart disease and other diseases of the circulatory system: Secondary | ICD-10-CM | POA: Diagnosis not present

## 2018-09-22 DIAGNOSIS — G7000 Myasthenia gravis without (acute) exacerbation: Secondary | ICD-10-CM | POA: Diagnosis not present

## 2018-09-22 DIAGNOSIS — M4697 Unspecified inflammatory spondylopathy, lumbosacral region: Secondary | ICD-10-CM | POA: Diagnosis not present

## 2018-09-22 DIAGNOSIS — J4540 Moderate persistent asthma, uncomplicated: Secondary | ICD-10-CM | POA: Diagnosis not present

## 2018-09-22 DIAGNOSIS — Z794 Long term (current) use of insulin: Secondary | ICD-10-CM | POA: Diagnosis not present

## 2018-09-22 DIAGNOSIS — A6920 Lyme disease, unspecified: Secondary | ICD-10-CM | POA: Diagnosis not present

## 2018-09-22 DIAGNOSIS — H9123 Sudden idiopathic hearing loss, bilateral: Secondary | ICD-10-CM | POA: Diagnosis not present

## 2018-09-22 DIAGNOSIS — I110 Hypertensive heart disease with heart failure: Secondary | ICD-10-CM | POA: Diagnosis not present

## 2018-09-22 DIAGNOSIS — H9042 Sensorineural hearing loss, unilateral, left ear, with unrestricted hearing on the contralateral side: Secondary | ICD-10-CM | POA: Diagnosis not present

## 2018-09-22 DIAGNOSIS — A6929 Other conditions associated with Lyme disease: Secondary | ICD-10-CM | POA: Diagnosis not present

## 2018-09-22 DIAGNOSIS — I5042 Chronic combined systolic (congestive) and diastolic (congestive) heart failure: Secondary | ICD-10-CM | POA: Diagnosis not present

## 2018-09-22 DIAGNOSIS — E785 Hyperlipidemia, unspecified: Secondary | ICD-10-CM | POA: Diagnosis not present

## 2018-09-22 DIAGNOSIS — E1122 Type 2 diabetes mellitus with diabetic chronic kidney disease: Secondary | ICD-10-CM | POA: Diagnosis not present

## 2018-09-22 DIAGNOSIS — M1990 Unspecified osteoarthritis, unspecified site: Secondary | ICD-10-CM | POA: Diagnosis not present

## 2018-09-22 DIAGNOSIS — N189 Chronic kidney disease, unspecified: Secondary | ICD-10-CM | POA: Diagnosis not present

## 2018-09-22 DIAGNOSIS — I429 Cardiomyopathy, unspecified: Secondary | ICD-10-CM | POA: Diagnosis not present

## 2018-09-22 DIAGNOSIS — E1142 Type 2 diabetes mellitus with diabetic polyneuropathy: Secondary | ICD-10-CM | POA: Diagnosis not present

## 2018-09-22 DIAGNOSIS — K219 Gastro-esophageal reflux disease without esophagitis: Secondary | ICD-10-CM | POA: Diagnosis not present

## 2018-09-22 DIAGNOSIS — Z955 Presence of coronary angioplasty implant and graft: Secondary | ICD-10-CM | POA: Diagnosis not present

## 2018-09-22 DIAGNOSIS — I2510 Atherosclerotic heart disease of native coronary artery without angina pectoris: Secondary | ICD-10-CM | POA: Diagnosis not present

## 2018-09-22 DIAGNOSIS — I252 Old myocardial infarction: Secondary | ICD-10-CM | POA: Diagnosis not present

## 2018-09-22 LAB — GLUCOSE, CAPILLARY
Glucose-Capillary: 161 mg/dL — ABNORMAL HIGH (ref 70–99)
Glucose-Capillary: 89 mg/dL (ref 70–99)

## 2018-09-23 ENCOUNTER — Encounter: Payer: Medicare Other | Admitting: Physician Assistant

## 2018-09-23 DIAGNOSIS — M4697 Unspecified inflammatory spondylopathy, lumbosacral region: Secondary | ICD-10-CM | POA: Diagnosis not present

## 2018-09-23 DIAGNOSIS — I130 Hypertensive heart and chronic kidney disease with heart failure and stage 1 through stage 4 chronic kidney disease, or unspecified chronic kidney disease: Secondary | ICD-10-CM | POA: Diagnosis not present

## 2018-09-23 DIAGNOSIS — K219 Gastro-esophageal reflux disease without esophagitis: Secondary | ICD-10-CM | POA: Diagnosis not present

## 2018-09-23 DIAGNOSIS — Z794 Long term (current) use of insulin: Secondary | ICD-10-CM | POA: Diagnosis not present

## 2018-09-23 DIAGNOSIS — Z8249 Family history of ischemic heart disease and other diseases of the circulatory system: Secondary | ICD-10-CM | POA: Diagnosis not present

## 2018-09-23 DIAGNOSIS — G4733 Obstructive sleep apnea (adult) (pediatric): Secondary | ICD-10-CM | POA: Diagnosis not present

## 2018-09-23 DIAGNOSIS — I2510 Atherosclerotic heart disease of native coronary artery without angina pectoris: Secondary | ICD-10-CM | POA: Diagnosis not present

## 2018-09-23 DIAGNOSIS — I5042 Chronic combined systolic (congestive) and diastolic (congestive) heart failure: Secondary | ICD-10-CM | POA: Diagnosis not present

## 2018-09-23 DIAGNOSIS — J4540 Moderate persistent asthma, uncomplicated: Secondary | ICD-10-CM | POA: Diagnosis not present

## 2018-09-23 DIAGNOSIS — M1990 Unspecified osteoarthritis, unspecified site: Secondary | ICD-10-CM | POA: Diagnosis not present

## 2018-09-23 DIAGNOSIS — E785 Hyperlipidemia, unspecified: Secondary | ICD-10-CM | POA: Diagnosis not present

## 2018-09-23 DIAGNOSIS — A6929 Other conditions associated with Lyme disease: Secondary | ICD-10-CM | POA: Diagnosis not present

## 2018-09-23 DIAGNOSIS — I110 Hypertensive heart disease with heart failure: Secondary | ICD-10-CM | POA: Diagnosis not present

## 2018-09-23 DIAGNOSIS — J45909 Unspecified asthma, uncomplicated: Secondary | ICD-10-CM | POA: Diagnosis not present

## 2018-09-23 DIAGNOSIS — G7000 Myasthenia gravis without (acute) exacerbation: Secondary | ICD-10-CM | POA: Diagnosis not present

## 2018-09-23 DIAGNOSIS — E1122 Type 2 diabetes mellitus with diabetic chronic kidney disease: Secondary | ICD-10-CM | POA: Diagnosis not present

## 2018-09-23 DIAGNOSIS — E1142 Type 2 diabetes mellitus with diabetic polyneuropathy: Secondary | ICD-10-CM | POA: Diagnosis not present

## 2018-09-23 DIAGNOSIS — Z955 Presence of coronary angioplasty implant and graft: Secondary | ICD-10-CM | POA: Diagnosis not present

## 2018-09-23 DIAGNOSIS — H9042 Sensorineural hearing loss, unilateral, left ear, with unrestricted hearing on the contralateral side: Secondary | ICD-10-CM | POA: Diagnosis not present

## 2018-09-23 DIAGNOSIS — N189 Chronic kidney disease, unspecified: Secondary | ICD-10-CM | POA: Diagnosis not present

## 2018-09-23 DIAGNOSIS — I252 Old myocardial infarction: Secondary | ICD-10-CM | POA: Diagnosis not present

## 2018-09-23 DIAGNOSIS — H9123 Sudden idiopathic hearing loss, bilateral: Secondary | ICD-10-CM | POA: Diagnosis not present

## 2018-09-23 DIAGNOSIS — A6920 Lyme disease, unspecified: Secondary | ICD-10-CM | POA: Diagnosis not present

## 2018-09-23 DIAGNOSIS — I429 Cardiomyopathy, unspecified: Secondary | ICD-10-CM | POA: Diagnosis not present

## 2018-09-23 LAB — GLUCOSE, CAPILLARY
Glucose-Capillary: 105 mg/dL — ABNORMAL HIGH (ref 70–99)
Glucose-Capillary: 130 mg/dL — ABNORMAL HIGH (ref 70–99)
Glucose-Capillary: 146 mg/dL — ABNORMAL HIGH (ref 70–99)

## 2018-09-23 NOTE — Progress Notes (Signed)
Edgar, Perry ([id number]) Visit Report for 09/22/2018 HBO Details Patient Name: [name], [name] Date of Service: 09/22/2018 1:00 PM Medical Record Number: [id number] Patient Account Number: [id number] Date of Birth/Sex: 03/27/1961 (57 y.o. M) Treating RN: Cornell Barman Primary Care [place] Journey: [name] Other Clinician: Jacqulyn Bath Referring Alese Furniss: [name] Treating Kharter Brew/Extender: Melburn Hake, HOYT Weeks in Treatment: 0 HBO Treatment Course Details Treatment Course Number: 1 Ordering [place] Schnitker: STONE III, HOYT Total Treatments Ordered: 20 HBO Treatment Start Date: 09/21/2018 HBO Indication: Other (specify in Notes) Acute Sensorineural Hearing Notes: Loss HBO Treatment Details Treatment Number: 2 Patient Type: Outpatient Chamber Type: Monoplace Chamber Serial #: [id number] Treatment Protocol: 2.0 ATA with 90 minutes oxygen, and no air breaks Treatment Details Compression Rate Down: 1.0 psi / minute De-Compression Rate Up: 2.0 psi / minute Compress Tx Pressure Air breaks and breathing periods Decompress Decompress Begins Reached (leave unused spaces blank) Begins Ends Chamber Pressure (ATA) 1 2 - - - - - - 2 1 Clock Time (24 hr) 13:07 13:24 - - - - - - 14:55 15:02 Treatment Length: 115 (minutes) Treatment Segments: 4 Capillary Blood Glucose Pre Capillary Blood Glucose (mg/dl): Post Capillary Blood Glucose (mg/dl): Vital Signs Capillary Blood Glucose Reference Range: 80 - 120 mg / dl HBO Diabetic Blood Glucose Intervention Range: <131 mg/dl or >249 mg/dl Time Vitals Blood Respiratory Capillary Blood Glucose Pulse Action Type: Pulse: Temperature: Taken: Pressure: Rate: Glucose (mg/dl): Meter #: Oximetry (%) Taken: Pre 12:53 130/82 78 16 97.9 161 1 none per protocol Post 15:08 136/78 84 16 97.9 89 1 Gave Ensure per protocol; PA-C notified Treatment Response Treatment Toleration: Well Treatment Completion Treatment Completed without Adverse  Event Status: Treatment Notes Patient felt fine after the Ensure and was released to go home per [facility], III, PA-C after waiting about 15 minutes. HBO Attestation Yes BRON, SNELLINGS. ([id number]) I certify that I supervised this HBO treatment in accordance with Medicare guidelines. A trained emergency response team is readily available per hospital policies and procedures. Continue HBOT as ordered. Yes Electronic Signature(s) Signed: 09/22/2018 5:48:37 PM By: Worthy Keeler PA-C Previous Signature: 09/22/2018 3:46:06 PM Version By: Lorine Bears RCP, RRT, CHT Entered By: Worthy Keeler on 09/22/2018 17:07:27 [name] ([id number]) -------------------------------------------------------------------------------- HBO Safety Checklist Details Patient Name: [name] Date of Service: 09/22/2018 1:00 PM Medical Record Number: [id number] Patient Account Number: [id number] Date of Birth/Sex: [birth year] (57 y.o. M) Treating RN: Cornell Barman Primary Care Gusta Marksberry: [name] Other Clinician: Jacqulyn Bath Referring [place] Sherr: [name] Treating Epifania Littrell/Extender: Melburn Hake, HOYT Weeks in Treatment: 0 HBO Safety Checklist Items Safety Checklist Consent Form Signed Patient voided / foley secured and emptied When did you last eato 09/22/18 am Last dose of injectable or oral agent 09/22/18 am Ostomy pouch emptied and vented if applicable NA All implantable devices assessed, documented and approved NA Intravenous access site secured and place NA Valuables secured Linens and cotton and cotton/polyester blend (less than 51% polyester) Personal oil-based products / skin lotions / body lotions removed Wigs or hairpieces removed NA Smoking or tobacco materials removed NA Books / newspapers / magazines / loose paper removed Cologne, aftershave, perfume and deodorant removed Jewelry removed (may wrap wedding band) Make-up removed NA Hair  care products removed Battery operated devices (external) removed Heating patches and chemical warmers removed Titanium eyewear removed NA Nail polish cured greater than 10 hours NA Casting material cured greater than 10 hours NA Hearing aids removed NA Loose dentures or partials  removed Prosthetics have been removed NA Patient demonstrates correct use of air break device (if applicable) Patient concerns have been addressed Patient grounding bracelet on and cord attached to chamber Specifics for Inpatients (complete in addition to above) Medication sheet sent with patient Intravenous medications needed or due during therapy sent with patient Drainage tubes (e.g. nasogastric tube or chest tube secured and vented) Endotracheal or Tracheotomy tube secured Cuff deflated of air and inflated with saline Airway suctioned Electronic Signature(s) Signed: 09/22/2018 3:46:06 PM By: Lorine Bears RCP, RRT, CHT [place], [place] M. ([id number]) Entered By: Lorine Bears on 09/22/2018 13:01:36

## 2018-09-23 NOTE — Progress Notes (Signed)
BEATRICE, SEHGAL ([id number]) Visit Report for 09/22/2018 Problem List Details Patient Name: [name], [name] Date of Service: 09/22/2018 1:00 PM Medical Record Number: [id number] Patient Account Number: [id number] Date of Birth/Sex: 02-20-1961 (57 y.o. M) Treating RN: Cornell Barman Primary Care Provider: [name] Other Clinician: Jacqulyn Bath Referring Provider: [name] Treating Provider/Extender: Melburn Hake, HOYT Weeks in Treatment: 0 Active Problems ICD-10 Evaluated Encounter Code Description Active Date Today Diagnosis H90.42 Sensorineural hearing loss, unilateral, left ear, with 09/20/2018 No Yes unrestricted hearing on the contralateral side E11.42 Type 2 diabetes mellitus with diabetic polyneuropathy 09/20/2018 No Yes I10 Essential (primary) hypertension 09/20/2018 No Yes A69.29 Other conditions associated with Lyme disease 09/20/2018 No Yes I43 Cardiomyopathy in diseases classified elsewhere 09/20/2018 No Yes I50.42 Chronic combined systolic (congestive) and diastolic 59/56/3875 No Yes (congestive) heart failure Inactive Problems Resolved Problems Electronic Signature(s) Signed: 09/22/2018 5:48:37 PM By: Worthy Keeler PA-C Entered By: Worthy Keeler on 09/22/2018 17:07:38 [place], Kris Hartmann ([id number]) -------------------------------------------------------------------------------- SuperBill Details Patient Name: [name] Date of Service: 09/22/2018 Medical Record Number: [id number] Patient Account Number: [id number] Date of Birth/Sex: 07/28/1961 (57 y.o. M) Treating RN: Cornell Barman Primary Care Provider: [name] Other Clinician: Jacqulyn Bath Referring Provider: [name] Treating Provider/Extender: Melburn Hake, HOYT Weeks in Treatment: 0 Diagnosis Coding ICD-10 Codes Code Description H90.42 Sensorineural hearing loss, unilateral, left ear, with unrestricted hearing on the contralateral side E11.42 Type 2 diabetes  mellitus with diabetic polyneuropathy I10 Essential (primary) hypertension A69.29 Other conditions associated with Lyme disease I43 Cardiomyopathy in diseases classified elsewhere Facility Procedures CPT4 Code: 16010932 Description: (Facility Use Only) HBOT, full body chamber, 64min Modifier: Quantity: 4 Physician Procedures CPT4: Description Modifier Quantity Code [id number] - WC PHYS HYPERBARIC OXYGEN THERAPY 1 ICD-10 Diagnosis Description H90.42 Sensorineural hearing loss, unilateral, left ear, with unrestricted hearing on the contralateral side E11.42 Type 2 diabetes  mellitus with diabetic polyneuropathy Electronic Signature(s) Signed: 09/22/2018 5:48:37 PM By: Worthy Keeler PA-C Previous Signature: 09/22/2018 3:46:06 PM Version By: Lorine Bears RCP, RRT, CHT Entered By: Worthy Keeler on 09/22/2018 17:07:32

## 2018-09-23 NOTE — Progress Notes (Signed)
MILBERT, BIXLER ([id number]) Visit Report for 09/22/2018 Arrival Information Details Patient Name: [name], [name] Date of Service: 09/22/2018 1:00 PM Medical Record Number: [id number] Patient Account Number: [id number] Date of Birth/Sex: August 04, 1961 (57 y.o. M) Treating RN: Cornell Barman Primary Care Kirat Mezquita: [name] Other Clinician: Jacqulyn Bath Referring Quron Ruddy: [name] Treating Bodee Lafoe/Extender: Melburn Hake, HOYT Weeks in Treatment: 0 Visit Information History Since Last Visit Added or deleted any medications: No Patient Arrived: Cane Any new allergies or adverse reactions: No Arrival Time: 12:50 Had a fall or experienced change in No Accompanied By: self activities of daily living that may affect Transfer Assistance: None risk of falls: Patient Identification Verified: Yes Signs or symptoms of abuse/neglect since last visito No Secondary Verification Process Completed: Yes Hospitalized since last visit: No Implantable device outside of the clinic excluding No cellular tissue based products placed in the center since last visit: Pain Present Now: No Electronic Signature(s) Signed: 09/22/2018 3:46:06 PM By: Lorine Bears RCP, RRT, CHT Entered By: Lorine Bears on 09/22/2018 12:59:37 [name] ([id number]) -------------------------------------------------------------------------------- Encounter Discharge Information Details Patient Name: [name] Date of Service: 09/22/2018 1:00 PM Medical Record Number: [id number] Patient Account Number: [id number] Date of Birth/Sex: [birth year] (57 y.o. M) Treating RN: Cornell Barman Primary Care Kano Heckmann: [name] Other Clinician: Jacqulyn Bath Referring Pinki Rottman: [name] Treating Estephania Licciardi/Extender: Melburn Hake, HOYT Weeks in Treatment: 0 Encounter Discharge Information Items Discharge Condition: Stable Ambulatory Status: Cane Discharge Destination:  Home Transportation: Private Auto Accompanied By: son Schedule Follow-up Appointment: No Clinical Summary of Care: Notes Patient has an HBO treatment scheduled on 09/23/18 at 13:00 pm. Electronic Signature(s) Signed: 09/22/2018 3:46:06 PM By: Lorine Bears RCP, RRT, CHT Entered By: Lorine Bears on 09/22/2018 15:45:37 [name] ([id number]) -------------------------------------------------------------------------------- Patient/Caregiver Education Details Patient Name: [name] Date of Service: 09/22/2018 1:00 PM Medical Record Number: [id number] Patient Account Number: [id number] Date of Birth/Gender: [birth year] (57 y.o. M) Treating RN: Cornell Barman Primary Care Physician: [name] Other Clinician: Jacqulyn Bath Referring Physician: [name] Treating Physician/Extender: Sharalyn Ink in Treatment: 0 Education Assessment Education Provided To: Patient Education Topics Provided Hyperbaric Oxygenation: Responses: State content correctly Electronic Signature(s) Signed: 09/22/2018 3:46:06 PM By: Lorine Bears RCP, RRT, CHT Entered By: Lorine Bears on 09/22/2018 15:44:56 [name], [name] ([id number]) -------------------------------------------------------------------------------- Vitals Details Patient Name: [name] Date of Service: 09/22/2018 1:00 PM Medical Record Number: [id number] Patient Account Number: [id number] Date of Birth/Sex: 05-May-1961 (57 y.o. M) Treating RN: Cornell Barman Primary Care Tauni Sanks: [name] Other Clinician: Jacqulyn Bath Referring Lora Chavers: [name] Treating Annita Ratliff/Extender: Melburn Hake, HOYT Weeks in Treatment: 0 Vital Signs Time Taken: 12:53 Temperature (F): 97.9 Height (in): 67 Pulse (bpm): 78 Weight (lbs): 232 Respiratory Rate (breaths/min): 16 Body Mass Index (BMI): 36.3 Blood Pressure (mmHg): 130/82 Capillary  Blood Glucose (mg/dl): 161 Reference Range: 80 - 120 mg / dl Electronic Signature(s) Signed: 09/22/2018 3:46:06 PM By: Lorine Bears RCP, RRT, CHT Entered By: Lorine Bears on 09/22/2018 13:00:05

## 2018-09-24 NOTE — Progress Notes (Signed)
AVRUM, KIMBALL ([id number]) Visit Report for 09/23/2018 Arrival Information Details Patient Name: [name], Perry Date of Service: 09/23/2018 1:00 PM Medical Record Number: [id number] Patient Account Number: [id number] Date of Birth/Sex: January 19, 1961 (57 y.o. M) Treating RN: Montey Hora Primary Care Kiora Hallberg: [name] Other Clinician: Jacqulyn Bath Referring Valery Chance: [name] Treating Jaydin Jalomo/Extender: Melburn Hake, HOYT Weeks in Treatment: 0 Visit Information History Since Last Visit Added or deleted any medications: No Patient Arrived: Cane Any new allergies or adverse reactions: No Arrival Time: 12:50 Had a fall or experienced change in No Accompanied By: self activities of daily living that may affect Transfer Assistance: None risk of falls: Patient Identification Verified: Yes Signs or symptoms of abuse/neglect since last visito No Secondary Verification Process Completed: Yes Hospitalized since last visit: No Implantable device outside of the clinic excluding No cellular tissue based products placed in the center since last visit: Pain Present Now: No Electronic Signature(s) Signed: 09/23/2018 4:06:05 PM By: Lorine Bears RCP, RRT, CHT Entered By: Lorine Bears on 09/23/2018 13:28:27 [name] ([id number]) -------------------------------------------------------------------------------- Encounter Discharge Information Details Patient Name: [name] Date of Service: 09/23/2018 1:00 PM Medical Record Number: [id number] Patient Account Number: [id number] Date of Birth/Sex: 1961/06/06 (57 y.o. M) Treating RN: Montey Hora Primary Care Izek Corvino: [name] Other Clinician: Jacqulyn Bath Referring Londen Bok: [name] Treating Danuel Felicetti/Extender: Melburn Hake, HOYT Weeks in Treatment: 0 Encounter Discharge Information Items Discharge Condition: Stable Ambulatory Status: Cane Discharge  Destination: Home Transportation: Private Auto Accompanied By: son Schedule Follow-up Appointment: No Clinical Summary of Care: Notes Patient has an HBO treatment scheduled on 09/26/18 at 13:00 pm. Electronic Signature(s) Signed: 09/23/2018 4:06:05 PM By: Lorine Bears RCP, RRT, CHT Entered By: Lorine Bears on 09/23/2018 16:05:14 [name] ([id number]) -------------------------------------------------------------------------------- Patient/Caregiver Education Details Patient Name: [name] Date of Service: 09/23/2018 1:00 PM Medical Record Number: [id number] Patient Account Number: [id number] Date of Birth/Gender: 1961-07-12 (57 y.o. M) Treating RN: Montey Hora Primary Care Physician: [name] Other Clinician: Jacqulyn Bath Referring Physician: [name] Treating Physician/Extender: Sharalyn Ink in Treatment: 0 Education Assessment Education Provided To: Patient Education Topics Provided Hyperbaric Oxygenation: Responses: State content correctly Electronic Signature(s) Signed: 09/23/2018 4:06:05 PM By: Lorine Bears RCP, RRT, CHT Entered By: Lorine Bears on 09/23/2018 16:04:26 [name] ([id number]) -------------------------------------------------------------------------------- Vitals Details Patient Name: [name] Date of Service: 09/23/2018 1:00 PM Medical Record Number: [id number] Patient Account Number: [id number] Date of Birth/Sex: 20-Jun-1961 (57 y.o. M) Treating RN: Montey Hora Primary Care Gaile Allmon: [name] Other Clinician: Jacqulyn Bath Referring Paxtyn Boyar: [name] Treating Greely Atiyeh/Extender: Melburn Hake, HOYT Weeks in Treatment: 0 Vital Signs Time Taken: 12:51 Temperature (F): 97.9 Height (in): 67 Pulse (bpm): 96 Weight (lbs): 232 Respiratory Rate (breaths/min): 16 Body Mass Index (BMI): 36.3 Blood Pressure (mmHg):  140/72 Capillary Blood Glucose (mg/dl): 105 Reference Range: 80 - 120 mg / dl Electronic Signature(s) Signed: 09/23/2018 4:06:05 PM By: Lorine Bears RCP, RRT, CHT Entered By: [name], [name] on 09/23/2018 13:28:57

## 2018-09-25 NOTE — Progress Notes (Signed)
ALISON, BREEDING ([id number]) Visit Report for 09/23/2018 Problem List Details Patient Name: [name], [name] Date of Service: 09/23/2018 1:00 PM Medical Record Number: [id number] Patient Account Number: [id number] Date of Birth/Sex: 1961-07-16 (57 y.o. 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, unilateral, left ear, with 09/20/2018 No Yes unrestricted hearing on the contralateral side E11.42 Type 2 diabetes mellitus with diabetic polyneuropathy 09/20/2018 No Yes I10 Essential (primary) hypertension 09/20/2018 No Yes A69.29 Other conditions associated with Lyme disease 09/20/2018 No Yes I43 Cardiomyopathy in diseases classified elsewhere 09/20/2018 No Yes 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, unilateral, left ear, with unrestricted hearing on the contralateral side E11.42 Type 2  diabetes mellitus with diabetic polyneuropathy I10 Essential (primary) hypertension A69.29 Other conditions associated with Lyme disease I43 Cardiomyopathy in diseases classified elsewhere Facility Procedures CPT4 Code: 50093818 Description: (Facility Use Only) HBOT, full body chamber, 38min Modifier: Quantity: 4 Physician Procedures CPT4: Description Modifier Quantity Code 2993716 [id number] - WC PHYS HYPERBARIC OXYGEN THERAPY 1 ICD-10 Diagnosis Description 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

## 2018-09-25 NOTE — Progress Notes (Signed)
PARK, BECK ([id number]) Visit Report for 09/23/2018 HBO Details Patient Name: [name], [name] Date of Service: 09/23/2018 1:00 PM Medical Record Number: [id number] Patient Account Number: [id number] Date of Birth/Sex: 07-09-1961 (57 y.o. M) Treating RN: Montey Hora Primary Care Amaurie Schreckengost: [name] Other Clinician: Jacqulyn Bath Referring Pradyun Ishman: [name] Treating Lamontae Ricardo/Extender: Melburn Hake, HOYT Weeks in Treatment: 0 HBO Treatment Course Details Treatment Course Number: 1 Ordering Anwar Crill: STONE III, HOYT Total Treatments Ordered: 20 HBO Treatment Start Date: 09/21/2018 HBO Indication: Other (specify in Notes) Acute Sensorineural Hearing Notes: Loss HBO Treatment Details Treatment Number: 3 Patient Type: Outpatient Chamber Type: Monoplace Chamber Serial #: [id number] Treatment Protocol: 2.0 ATA with 90 minutes oxygen, and no air breaks Treatment Details Compression Rate Down: 1.5 psi / minute De-Compression Rate Up: 2.0 psi / minute Compress Tx Pressure Air breaks and breathing periods Decompress Decompress Begins Reached (leave unused spaces blank) Begins Ends Chamber Pressure (ATA) 1 2 - - - - - - 2 1 Clock Time (24 hr) 13:36 13:48 - - - - - - 15:18 15:25 Treatment Length: 109 (minutes) Treatment Segments: 4 Capillary Blood Glucose Pre Capillary Blood Glucose (mg/dl): Post Capillary Blood Glucose (mg/dl): Vital Signs Capillary Blood Glucose Reference Range: 80 - 120 mg / dl HBO Diabetic Blood Glucose Intervention Range: <131 mg/dl or >249 mg/dl Time Vitals Blood Respiratory Capillary Blood Glucose Pulse Action Type: Pulse: Temperature: Taken: Pressure: Rate: Glucose (mg/dl): Meter #: Oximetry (%) Taken: Pre 12:51 140/72 96 16 97.9 105 1 none per protocol Pre 13:32 130 Proceeded with treatment per protocol Post 15:31 132/72 90 16 97.7 146 1 none per protocol Treatment Response Treatment Toleration: Well Treatment Completion Treatment  Completed without Adverse Event Status: [name] Notes Patient continues to tolerate HBO therapy very well without complication. HBO Attestation [name], [name]. ([id number]) I certify that I supervised this HBO treatment in accordance with Medicare guidelines. A trained emergency response Yes team is readily available per hospital policies and procedures. Continue HBOT as ordered. Yes Electronic Signature(s) Signed: 09/24/2018 12:35:32 AM By: Worthy Keeler PA-C Previous Signature: 09/23/2018 4:06:05 PM Version By: Lorine Bears RCP, RRT, CHT Entered By: Worthy Keeler on 09/23/2018 17:35:14 [name], [name] ([id number]) -------------------------------------------------------------------------------- HBO Safety Checklist Details Patient Name: [name] Date of Service: 09/23/2018 1:00 PM Medical Record Number: [id number] Patient Account Number: [id number] Date of Birth/Sex: 04-28-1961 (57 y.o. M) Treating RN: Montey Hora Primary Care Doyl Bitting: [name] Other Clinician: Jacqulyn Bath Referring Erlean Mealor: [name] Treating Jennelle Pinkstaff/Extender: Melburn Hake, HOYT Weeks in Treatment: 0 HBO Safety Checklist Items Safety Checklist Consent Form Signed Patient voided / foley secured and emptied When did you last eato 09/23/18 noon Last dose of injectable or oral agent 09/23/18 noon Ostomy pouch emptied and vented if applicable NA All implantable devices assessed, documented and approved NA Intravenous access site secured and place NA Valuables secured Linens and cotton and cotton/polyester blend (less than 51% polyester) Personal oil-based products / skin lotions / body lotions removed Wigs or hairpieces removed NA Smoking or tobacco materials removed NA Books / newspapers / magazines / loose paper removed Cologne, aftershave, perfume and deodorant removed Jewelry removed (may wrap wedding band) Make-up removed NA Hair care products  removed Battery operated devices (external) removed Heating patches and chemical warmers removed Titanium eyewear removed NA Nail polish cured greater than 10 hours NA Casting material cured greater than 10 hours NA Hearing aids removed NA Loose dentures or partials removed NA Prosthetics have been removed NA  Patient demonstrates correct use of air break device (if applicable) Patient concerns have been addressed Patient grounding bracelet on and cord attached to chamber Specifics for Inpatients (complete in addition to above) Medication sheet sent with patient Intravenous medications needed or due during therapy sent with patient Drainage tubes (e.g. nasogastric tube or chest tube secured and vented) Endotracheal or Tracheotomy tube secured Cuff deflated of air and inflated with saline Airway suctioned Electronic Signature(s) Signed: 09/23/2018 4:06:05 PM By: Lorine Bears RCP, RRT, CHT [name], [name] ([id number]) Entered By: Lorine Bears on 09/23/2018 13:30:04

## 2018-09-26 ENCOUNTER — Encounter: Payer: Medicare Other | Admitting: Physician Assistant

## 2018-09-26 DIAGNOSIS — I429 Cardiomyopathy, unspecified: Secondary | ICD-10-CM | POA: Diagnosis not present

## 2018-09-26 DIAGNOSIS — I5042 Chronic combined systolic (congestive) and diastolic (congestive) heart failure: Secondary | ICD-10-CM | POA: Diagnosis not present

## 2018-09-26 DIAGNOSIS — I110 Hypertensive heart disease with heart failure: Secondary | ICD-10-CM | POA: Diagnosis not present

## 2018-09-26 DIAGNOSIS — I2510 Atherosclerotic heart disease of native coronary artery without angina pectoris: Secondary | ICD-10-CM | POA: Diagnosis not present

## 2018-09-26 DIAGNOSIS — N189 Chronic kidney disease, unspecified: Secondary | ICD-10-CM | POA: Diagnosis not present

## 2018-09-26 DIAGNOSIS — Z794 Long term (current) use of insulin: Secondary | ICD-10-CM | POA: Diagnosis not present

## 2018-09-26 DIAGNOSIS — A6929 Other conditions associated with Lyme disease: Secondary | ICD-10-CM | POA: Diagnosis not present

## 2018-09-26 DIAGNOSIS — J4540 Moderate persistent asthma, uncomplicated: Secondary | ICD-10-CM | POA: Diagnosis not present

## 2018-09-26 DIAGNOSIS — E785 Hyperlipidemia, unspecified: Secondary | ICD-10-CM | POA: Diagnosis not present

## 2018-09-26 DIAGNOSIS — H9123 Sudden idiopathic hearing loss, bilateral: Secondary | ICD-10-CM | POA: Diagnosis not present

## 2018-09-26 DIAGNOSIS — E1122 Type 2 diabetes mellitus with diabetic chronic kidney disease: Secondary | ICD-10-CM | POA: Diagnosis not present

## 2018-09-26 DIAGNOSIS — I130 Hypertensive heart and chronic kidney disease with heart failure and stage 1 through stage 4 chronic kidney disease, or unspecified chronic kidney disease: Secondary | ICD-10-CM | POA: Diagnosis not present

## 2018-09-26 DIAGNOSIS — M1990 Unspecified osteoarthritis, unspecified site: Secondary | ICD-10-CM | POA: Diagnosis not present

## 2018-09-26 DIAGNOSIS — Z8249 Family history of ischemic heart disease and other diseases of the circulatory system: Secondary | ICD-10-CM | POA: Diagnosis not present

## 2018-09-26 DIAGNOSIS — I252 Old myocardial infarction: Secondary | ICD-10-CM | POA: Diagnosis not present

## 2018-09-26 DIAGNOSIS — G7000 Myasthenia gravis without (acute) exacerbation: Secondary | ICD-10-CM | POA: Diagnosis not present

## 2018-09-26 DIAGNOSIS — A6920 Lyme disease, unspecified: Secondary | ICD-10-CM | POA: Diagnosis not present

## 2018-09-26 DIAGNOSIS — J45909 Unspecified asthma, uncomplicated: Secondary | ICD-10-CM | POA: Diagnosis not present

## 2018-09-26 DIAGNOSIS — K219 Gastro-esophageal reflux disease without esophagitis: Secondary | ICD-10-CM | POA: Diagnosis not present

## 2018-09-26 DIAGNOSIS — G4733 Obstructive sleep apnea (adult) (pediatric): Secondary | ICD-10-CM | POA: Diagnosis not present

## 2018-09-26 DIAGNOSIS — E1142 Type 2 diabetes mellitus with diabetic polyneuropathy: Secondary | ICD-10-CM | POA: Diagnosis not present

## 2018-09-26 DIAGNOSIS — H9042 Sensorineural hearing loss, unilateral, left ear, with unrestricted hearing on the contralateral side: Secondary | ICD-10-CM | POA: Diagnosis not present

## 2018-09-26 DIAGNOSIS — M4697 Unspecified inflammatory spondylopathy, lumbosacral region: Secondary | ICD-10-CM | POA: Diagnosis not present

## 2018-09-26 DIAGNOSIS — Z955 Presence of coronary angioplasty implant and graft: Secondary | ICD-10-CM | POA: Diagnosis not present

## 2018-09-26 LAB — GLUCOSE, CAPILLARY
Glucose-Capillary: 188 mg/dL — ABNORMAL HIGH (ref 70–99)
Glucose-Capillary: 150 mg/dL — ABNORMAL HIGH (ref 70–99)

## 2018-09-26 NOTE — Progress Notes (Signed)
[name] ([id number]) Visit Report for 09/26/2018 Arrival Information Details Patient Name: [name] Date of Service: 09/26/2018 1:00 PM Medical Record Number: [id number] Patient Account Number: [id number] Date of Birth/Sex: 15-Jun-1961 (57 y.o. M) Treating RN: Harold Barban Primary Care Mariselda Badalamenti: [name] Other Clinician: Jacqulyn Bath Referring Truitt Cruey: [name] Treating Maxima Skelton/Extender: Melburn Hake, HOYT Weeks in Treatment: 0 Visit Information History Since Last Visit Added or deleted any medications: No Patient Arrived: Cane Any new allergies or adverse reactions: No Arrival Time: 12:50 Had a fall or experienced change in No Accompanied By: self activities of daily living that may affect Transfer Assistance: None risk of falls: Patient Identification Verified: Yes Signs or symptoms of abuse/neglect since last visito No Secondary Verification Process Completed: Yes Hospitalized since last visit: No Implantable device outside of the clinic excluding No cellular tissue based products placed in the center since last visit: Pain Present Now: No Electronic Signature(s) Signed: 09/26/2018 3:57:44 PM By: Lorine Bears RCP, RRT, CHT Entered By: Lorine Bears on 09/26/2018 13:08:30 [name] ([id number]) -------------------------------------------------------------------------------- Encounter Discharge Information Details Patient Name: [name] Date of Service: 09/26/2018 1:00 PM Medical Record Number: [id number] Patient Account Number: [id number] Date of Birth/Sex: 1961/10/01 (57 y.o. M) Treating RN: Harold Barban Primary Care Voncile Schwarz: [name] Other Clinician: Jacqulyn Bath Referring Taiana Temkin: [name] Treating Damian Hofstra/Extender: Melburn Hake, HOYT Weeks in Treatment: 0 Encounter Discharge Information Items Discharge Condition: Stable Ambulatory Status: Cane Discharge  Destination: Home Transportation: Private Auto Accompanied By: self Schedule Follow-up Appointment: No Clinical Summary of Care: Notes Patient has an HBO treatment scheduled on 09/27/18 at 08:00 am. Electronic Signature(s) Signed: 09/26/2018 3:57:44 PM By: Lorine Bears RCP, RRT, CHT Entered By: Lorine Bears on 09/26/2018 15:18:23 [name] ([id number]) -------------------------------------------------------------------------------- Patient/Caregiver Education Details Patient Name: [name] Date of Service: 09/26/2018 1:00 PM Medical Record Number: [id number] Patient Account Number: [id number] Date of Birth/Gender: 07-Mar-1961 (57 y.o. M) Treating RN: Harold Barban Primary Care Physician: [name] Other Clinician: Jacqulyn Bath Referring Physician: [name] Treating Physician/Extender: Sharalyn Ink in Treatment: 0 Education Assessment Education Provided To: Patient Education Topics Provided Hyperbaric Oxygenation: Responses: State content correctly Electronic Signature(s) Signed: 09/26/2018 3:57:44 PM By: Lorine Bears RCP, RRT, CHT Entered By: Lorine Bears on 09/26/2018 15:17:40 [name] ([id number]) -------------------------------------------------------------------------------- Vitals Details Patient Name: [name] Date of Service: 09/26/2018 1:00 PM Medical Record Number: [id number] Patient Account Number: [id number] Date of Birth/Sex: [birth year] (57 y.o. M) Treating RN: Harold Barban Primary Care Daxten Kovalenko: [name] Other Clinician: Jacqulyn Bath Referring Angell Pincock: [name] Treating Thana Ramp/Extender: Melburn Hake, HOYT Weeks in Treatment: 0 Vital Signs Time Taken: 12:50 Temperature (F): 97.9 Height (in): 67 Pulse (bpm): 102 Weight (lbs): 232 Respiratory Rate (breaths/min): 16 Body Mass Index (BMI): 36.3 Blood Pressure  (mmHg): 128/84 Capillary Blood Glucose (mg/dl): 188 Reference Range: 80 - 120 mg / dl Electronic Signature(s) Signed: 09/26/2018 3:57:44 PM By: Lorine Bears RCP, RRT, CHT Entered By: Lorine Bears on 09/26/2018 13:09:11

## 2018-09-27 ENCOUNTER — Encounter: Payer: Medicare Other | Admitting: Physician Assistant

## 2018-09-27 DIAGNOSIS — H9123 Sudden idiopathic hearing loss, bilateral: Secondary | ICD-10-CM | POA: Diagnosis not present

## 2018-09-27 DIAGNOSIS — Z955 Presence of coronary angioplasty implant and graft: Secondary | ICD-10-CM | POA: Diagnosis not present

## 2018-09-27 DIAGNOSIS — H9042 Sensorineural hearing loss, unilateral, left ear, with unrestricted hearing on the contralateral side: Secondary | ICD-10-CM | POA: Diagnosis not present

## 2018-09-27 DIAGNOSIS — I5042 Chronic combined systolic (congestive) and diastolic (congestive) heart failure: Secondary | ICD-10-CM | POA: Diagnosis not present

## 2018-09-27 DIAGNOSIS — E1142 Type 2 diabetes mellitus with diabetic polyneuropathy: Secondary | ICD-10-CM | POA: Diagnosis not present

## 2018-09-27 DIAGNOSIS — I252 Old myocardial infarction: Secondary | ICD-10-CM | POA: Diagnosis not present

## 2018-09-27 DIAGNOSIS — Z8249 Family history of ischemic heart disease and other diseases of the circulatory system: Secondary | ICD-10-CM | POA: Diagnosis not present

## 2018-09-27 DIAGNOSIS — A6929 Other conditions associated with Lyme disease: Secondary | ICD-10-CM | POA: Diagnosis not present

## 2018-09-27 DIAGNOSIS — I429 Cardiomyopathy, unspecified: Secondary | ICD-10-CM | POA: Diagnosis not present

## 2018-09-27 DIAGNOSIS — I110 Hypertensive heart disease with heart failure: Secondary | ICD-10-CM | POA: Diagnosis not present

## 2018-09-27 DIAGNOSIS — I2510 Atherosclerotic heart disease of native coronary artery without angina pectoris: Secondary | ICD-10-CM | POA: Diagnosis not present

## 2018-09-27 DIAGNOSIS — J45909 Unspecified asthma, uncomplicated: Secondary | ICD-10-CM | POA: Diagnosis not present

## 2018-09-27 LAB — GLUCOSE, CAPILLARY
Glucose-Capillary: 252 mg/dL — ABNORMAL HIGH (ref 70–99)
Glucose-Capillary: 196 mg/dL — ABNORMAL HIGH (ref 70–99)

## 2018-09-28 NOTE — Progress Notes (Signed)
FREDERIC, TONES ([id number]) Visit Report for 09/27/2018 HBO Details Patient Name: [name], [name] Date of Service: 09/27/2018 8:00 AM Medical Record Number: [id number] Patient Account Number: [id number] Date of Birth/Sex: 01-20-1961 (57 y.o. M) Treating RN: Montey Hora Primary Care Durward Matranga: [name] Other Clinician: Jacqulyn Bath Referring Kylee Umana: [name] Treating Syble Picco/Extender: Melburn Hake, HOYT Weeks in Treatment: 1 HBO Treatment Course Details Treatment Course Number: 1 Ordering Brighten Orndoff: STONE III, HOYT Total Treatments Ordered: 20 HBO Treatment Start Date: 09/21/2018 HBO Indication: Other (specify in Notes) Acute Sensorineural Hearing Notes: Loss HBO Treatment Details Treatment Number: 5 Patient Type: Outpatient Chamber Type: Monoplace Chamber Serial #: [id number] Treatment Protocol: 2.0 ATA with 90 minutes oxygen, and no air breaks Treatment Details Compression Rate Down: 1.5 psi / minute De-Compression Rate Up: 2.0 psi / minute Compress Tx Pressure Air breaks and breathing periods Decompress Decompress Begins Reached (leave unused spaces blank) Begins Ends Chamber Pressure (ATA) 1 2 - - - - - - 2 1 Clock Time (24 hr) 08:11 08:23 - - - - - - 09:53 10:01 Treatment Length: 110 (minutes) Treatment Segments: 4 Capillary Blood Glucose Pre Capillary Blood Glucose (mg/dl): Post Capillary Blood Glucose (mg/dl): Vital Signs Capillary Blood Glucose Reference Range: 80 - 120 mg / dl HBO Diabetic Blood Glucose Intervention Range: <131 mg/dl or >249 mg/dl Time Vitals Blood Respiratory Capillary Blood Glucose Pulse Action Type: Pulse: Temperature: Taken: Pressure: Rate: Glucose (mg/dl): Meter #: Oximetry (%) Taken: Pre 07:58 110/62 96 16 97.8 252 1 none per protocol Post 10:06 110/68 90 16 97.8 196 1 none per protocol Treatment Response Treatment Toleration: Well Treatment Completion Treatment Completed without Adverse Event Status: HBO  Attestation I certify that I supervised this HBO treatment in accordance with Medicare guidelines. A trained emergency response Yes team is readily available per hospital policies and procedures. HAITHAM, DOLINSKY. ([id number]) Continue HBOT as ordered. Yes Electronic Signature(s) Signed: 09/27/2018 1:28:21 PM By: Worthy Keeler PA-C Previous Signature: 09/27/2018 10:38:43 AM Version By: Lorine Bears RCP, RRT, CHT Entered By: Worthy Keeler on 09/27/2018 11:59:02 Andaya, Kris Hartmann ([id number]) -------------------------------------------------------------------------------- HBO Safety Checklist Details Patient Name: [name] Date of Service: 09/27/2018 8:00 AM Medical Record Number: [id number] Patient Account Number: [id number] Date of Birth/Sex: 1961-05-18 (57 y.o. M) Treating RN: Montey Hora Primary Care Lionardo Haze: [name] Other Clinician: Jacqulyn Bath Referring Kollyns Mickelson: [name] Treating Dannia Snook/Extender: Melburn Hake, HOYT Weeks in Treatment: 1 HBO Safety Checklist Items Safety Checklist Consent Form Signed Patient voided / foley secured and emptied When did you last eato 07:00 am on 09/27/18 Last dose of injectable or oral agent 0700 am on 09/27/18 Ostomy pouch emptied and vented if applicable NA All implantable devices assessed, documented and approved NA Intravenous access site secured and place NA Valuables secured Linens and cotton and cotton/polyester blend (less than 51% polyester) Personal oil-based products / skin lotions / body lotions removed Wigs or hairpieces removed NA Smoking or tobacco materials removed NA Books / newspapers / magazines / loose paper removed Cologne, aftershave, perfume and deodorant removed Jewelry removed (may wrap wedding band) Make-up removed NA Hair care products removed Battery operated devices (external) removed Heating patches and chemical warmers removed Titanium eyewear  removed NA Nail polish cured greater than 10 hours NA Casting material cured greater than 10 hours NA Hearing aids removed NA Loose dentures or partials removed Prosthetics have been removed NA Patient demonstrates correct use of air break device (if applicable) Patient concerns have been addressed Patient grounding  bracelet on and cord attached to chamber Specifics for Inpatients (complete in addition to above) Medication sheet sent with patient Intravenous medications needed or due during therapy sent with patient Drainage tubes (e.g. nasogastric tube or chest tube secured and vented) Endotracheal or Tracheotomy tube secured Cuff deflated of air and inflated with saline Airway suctioned Electronic Signature(s) Signed: 09/27/2018 10:38:43 AM By: Lorine Bears RCP, RRT, CHT [place], Kris Hartmann ([id number]) Entered By: Lorine Bears on 09/27/2018 08:26:32

## 2018-09-28 NOTE — Progress Notes (Signed)
ULYSEE, FYOCK ([id number]) Visit Report for 09/27/2018 Arrival Information Details Patient Name: [name], [name] Date of Service: 09/27/2018 8:00 AM Medical Record Number: [id number] Patient Account Number: [id number] Date of Birth/Sex: 1961-06-11 (57 y.o. M) Treating RN: Montey Hora Primary Care Jenavieve Freda: [name] Other Clinician: Jacqulyn Bath Referring Bular Hickok: [name] Treating Yelena Metzer/Extender: Melburn Hake, HOYT Weeks in Treatment: 1 Visit Information History Since Last Visit Added or deleted any medications: No Patient Arrived: Cane Any new allergies or adverse reactions: No Arrival Time: 08:22 Had a fall or experienced change in No Accompanied By: self activities of daily living that may affect Transfer Assistance: None risk of falls: Patient Identification Verified: Yes Signs or symptoms of abuse/neglect since last visito No Secondary Verification Process Completed: Yes Hospitalized since last visit: No Implantable device outside of the clinic excluding No cellular tissue based products placed in the center since last visit: Pain Present Now: No Electronic Signature(s) Signed: 09/27/2018 10:38:43 AM By: Lorine Bears RCP, RRT, CHT Entered By: Lorine Bears on 09/27/2018 08:24:10 [name] ([id number]) -------------------------------------------------------------------------------- Encounter Discharge Information Details Patient Name: [name] Date of Service: 09/27/2018 8:00 AM Medical Record Number: [id number] Patient Account Number: [id number] Date of Birth/Sex: 1961-03-02 (57 y.o. M) Treating RN: Montey Hora Primary Care Alnisa Hasley: [name] Other Clinician: Jacqulyn Bath Referring Genee Rann: [name] Treating Vanderbilt Ranieri/Extender: Melburn Hake, HOYT Weeks in Treatment: 1 Encounter Discharge Information Items Discharge Condition: Stable Ambulatory Status: Cane Discharge  Destination: Home Transportation: Private Auto Accompanied By: self Schedule Follow-up Appointment: No Clinical Summary of Care: Notes Patient has an HBO treatment scheduled on 09/30/18 at 13:00 pm. Electronic Signature(s) Signed: 09/27/2018 10:38:43 AM By: Lorine Bears RCP, RRT, CHT Entered By: Lorine Bears on 09/27/2018 10:24:15 [name] ([id number]) -------------------------------------------------------------------------------- Patient/Caregiver Education Details Patient Name: [name] Date of Service: 09/27/2018 8:00 AM Medical Record Number: [id number] Patient Account Number: [id number] Date of Birth/Gender: [birth year] (57 y.o. M) Treating RN: Montey Hora Primary Care Physician: [name] Other Clinician: Jacqulyn Bath Referring Physician: [name] Treating Physician/Extender: Sharalyn Ink in Treatment: 1 Education Assessment Education Provided To: Patient Education Topics Provided Hyperbaric Oxygenation: Responses: State content correctly Electronic Signature(s) Signed: 09/27/2018 10:38:43 AM By: Lorine Bears RCP, RRT, CHT Entered By: Lorine Bears on 09/27/2018 10:23:37 [name] ([id number]) -------------------------------------------------------------------------------- Vitals Details Patient Name: [name] Date of Service: 09/27/2018 8:00 AM Medical Record Number: [id number] Patient Account Number: [id number] Date of Birth/Sex: [birth year] (57 y.o. M) Treating RN: Montey Hora Primary Care Solei Wubben: [name] Other Clinician: Jacqulyn Bath Referring Alaisha Eversley: [name] Treating Kincaid Tiger/Extender: Melburn Hake, HOYT Weeks in Treatment: 1 Vital Signs Time Taken: 07:58 Temperature (F): 97.8 Height (in): 67 Pulse (bpm): 96 Weight (lbs): 232 Respiratory Rate (breaths/min): 16 Body Mass Index (BMI): 36.3 Blood Pressure  (mmHg): 110/62 Capillary Blood Glucose (mg/dl): 252 Reference Range: 80 - 120 mg / dl Electronic Signature(s) Signed: 09/27/2018 10:38:43 AM By: Lorine Bears RCP, RRT, CHT Entered By: [name], [name] on 09/27/2018 08:24:57

## 2018-09-28 NOTE — Progress Notes (Signed)
ARTAVIS, COWIE ([id number]) Visit Report for 09/27/2018 Problem List Details Patient Name: [name], [name] Date of Service: 09/27/2018 8:00 AM Medical Record Number: [id number] Patient Account Number: [id number] Date of Birth/Sex: 23-Jun-1961 (57 y.o. M) Treating RN: Montey Hora Primary Care Provider: [name] Other Clinician: Jacqulyn Bath Referring Provider: [name] Treating Provider/Extender: Melburn Hake, HOYT Weeks in Treatment: 1 Active Problems ICD-10 Evaluated Encounter Code Description Active Date Today Diagnosis H90.42 Sensorineural hearing loss, unilateral, left ear, with 09/20/2018 No Yes unrestricted hearing on the contralateral side E11.42 Type 2 diabetes mellitus with diabetic polyneuropathy 09/20/2018 No Yes I10 Essential (primary) hypertension 09/20/2018 No Yes A69.29 Other conditions associated with Lyme disease 09/20/2018 No Yes I43 Cardiomyopathy in diseases classified elsewhere 09/20/2018 No Yes I50.42 Chronic combined systolic (congestive) and diastolic 66/44/0347 No Yes (congestive) heart failure Inactive Problems Resolved Problems Electronic Signature(s) Signed: 09/27/2018 1:28:21 PM By: Worthy Keeler PA-C Entered By: Worthy Keeler on 09/27/2018 11:59:11 [place], Kris Hartmann ([id number]) -------------------------------------------------------------------------------- SuperBill Details Patient Name: [name] Date of Service: 09/27/2018 Medical Record Number: [id number] Patient Account Number: [id number] Date of Birth/Sex: November 02, 1960 (57 y.o. 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, unilateral, left ear, with unrestricted hearing on the contralateral side E11.42 Type 2 diabetes  mellitus with diabetic polyneuropathy I10 Essential (primary) hypertension A69.29 Other conditions associated with Lyme disease I43 Cardiomyopathy in diseases classified elsewhere Facility Procedures CPT4 Code: 88416606 Description: (Facility Use Only) HBOT, full body chamber, 5min Modifier: Quantity: 4 Physician Procedures CPT4: Description Modifier Quantity Code 3016010 93235 - WC PHYS HYPERBARIC OXYGEN THERAPY 1 ICD-10 Diagnosis Description H90.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

## 2018-09-29 ENCOUNTER — Ambulatory Visit (INDEPENDENT_AMBULATORY_CARE_PROVIDER_SITE_OTHER): Payer: Medicare Other | Admitting: Family Medicine

## 2018-09-29 ENCOUNTER — Encounter: Payer: Self-pay | Admitting: Family Medicine

## 2018-09-29 VITALS — BP 110/66 | HR 97 | Temp 97.8°F | Resp 16 | Ht 67.0 in | Wt 239.4 lb

## 2018-09-29 DIAGNOSIS — I5022 Chronic systolic (congestive) heart failure: Secondary | ICD-10-CM

## 2018-09-29 DIAGNOSIS — G7000 Myasthenia gravis without (acute) exacerbation: Secondary | ICD-10-CM

## 2018-09-29 DIAGNOSIS — R2689 Other abnormalities of gait and mobility: Secondary | ICD-10-CM | POA: Diagnosis not present

## 2018-09-29 DIAGNOSIS — I200 Unstable angina: Secondary | ICD-10-CM | POA: Diagnosis not present

## 2018-09-29 DIAGNOSIS — H9193 Unspecified hearing loss, bilateral: Secondary | ICD-10-CM | POA: Diagnosis not present

## 2018-09-29 NOTE — Progress Notes (Signed)
Name: [name]   MRN: [id number]    DOB: Dec 29, 1960   Date:09/29/2018       Progress Note  Subjective  Chief Complaint  Chief Complaint  Patient presents with  . Form Completion  . Results    HPI  CAD with angina/CHF/HTN:  multiple stents in the past, he had a new stent placed July 2019, also diagnosed with CHF and is now on Entresto, Lasix prn only , beta blocker and back on plavix and aspirin. Tolerating medication well. He states very seldom has dizziness when getting up.  He has been on disability because of heart disease and forms filled out today   Fever: resolved, still seeing Dr. Tommy Medal, last CRP and sed rate back to normal, still unable to hear from the left side and seeing ENT also. Having hyperbaric treatment to help with hearing but unchanged symptoms so far.   MG: seeing neurologist - Dr. Melrose Nakayama.  He has ptosis of left eye throughout the day, but both sides at the end of the day. He has fatigue after activity. Stable on medications. He was evaluated by PT last year and reviewed forms since no change we will fill out the form from this year. He still uses a cane.    Patient Active Problem List   Diagnosis Date Noted  . Bacteremia   . FUO (fever of unknown origin) 08/03/2018  . Sepsis ([place]) 07/08/2018  . Colitis 07/08/2018  . OSA on CPAP 07/08/2018  . Unstable angina ([place]) 04/26/2018  . Ischemic cardiomyopathy 04/26/2018  . Chronic combined systolic and diastolic CHF (congestive heart failure) ([place]) 04/26/2018  . Effort angina ([place]) 04/25/2018  . 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 Hyperlipidemia due to type 2 diabetes mellitus ([place]) 08/12/2017  . Vitamin D deficiency, unspecified 08/12/2017  . Ptosis of left eyelid 01/08/2017  . Dermatitis 12/24/2016  . Difficulty walking 04/27/2016  . Foot cramps 04/27/2016  . Morbid (severe) obesity due to excess calories ([place]) 04/06/2016  . Benign neoplasm of sigmoid colon   . Benign  neoplasm of descending colon   . Benign neoplasm of transverse colon   . Coronary artery disease involving native coronary artery with angina pectoris ([place]) 10/22/2015  . Coronary artery disease 08/22/2015  . DM (diabetes mellitus), type 2, uncontrolled with complications ([place]) 45/80/9983  . Myasthenia gravis ([place]) 08/22/2015  . Chronic left-sided low back pain 08/22/2015  . Hypertension 08/22/2015  . GERD (gastroesophageal reflux disease) 08/22/2015  . Arthritis 08/22/2015  . Diabetic peripheral neuropathy ([place]) 08/22/2015  . Hyperlipidemia 08/22/2015  . Insomnia 08/22/2015  . Type 2 diabetes mellitus with diabetic polyneuropathy, with long-term current use of insulin ([place]) 08/22/2015    Past Surgical History:  Procedure Laterality Date  . BILATERAL CARPAL TUNNEL RELEASE Bilateral L in 2012 and R in 2013  . CARDIAC CATHETERIZATION     Several Caths, most recent in  March 2016.  [name] COLONOSCOPY WITH PROPOFOL N/A 01/10/2016   Procedure: COLONOSCOPY WITH PROPOFOL;  Surgeon: [name], MD;  Location: [facility] END[facility]COPY;  Service: Endoscopy;  Laterality: N/A;  . CORONARY ANGIOPLASTY    . CORONARY STENT INTERVENTION N/A 04/25/2018   Procedure: CORONARY STENT INTERVENTION;  Surgeon: [name], MD;  Location: [place] [facility];  Service: Cardiovascular;  Laterality: N/A;  . ESOPHAGOGASTRODUODENOSCOPY (EGD) WITH PROPOFOL N/A 01/10/2016   Procedure: ESOPHAGOGASTRODUODENOSCOPY (EGD) WITH PROPOFOL;  Surgeon: [name], MD;  Location: [facility] ENDOSCOPY;  Service: Endoscopy;  Laterality: N/A;  . EYE  SURGERY Bilateral 2012   cataract/bilateral vitrectomies  . LEFT HEART CATH AND CORONARY ANGIOGRAPHY Left 04/25/2018   Procedure: LEFT HEART CATH AND CORONARY ANGIOGRAPHY;  Surgeon: [name], MD;  Location: [place] [facility];  Service: Cardiovascular;  Laterality: Left;  . TEE WITHOUT CARDIOVERSION N/A 09/05/2018   Procedure: TRANSESOPHAGEAL ECHOCARDIOGRAM (TEE);  Surgeon: [name], MD;  Location: [facility];  Service: Cardiovascular;  Laterality: N/A;  . TONSILLECTOMY AND ADENOIDECTOMY     As a child  . TUNNELED VENOUS CATHETER PLACEMENT     removed    Family History  Problem Relation Age of Onset  . Diabetes Mother   . Heart disease Mother   . Cancer Father        Prostate CA  . Diabetes Brother   . Healthy Brother   . Healthy Brother     Social History   Socioeconomic History  . Marital status: Married    Spouse name: Neoma Laming  . Number of children: 0  . Years of education: Not on file  . Highest education level: Master's degree (e.g., MA, MS, MEng, MEd, MSW, MBA)  Occupational History  . Occupation: disabled    Comment: multiple medical problems - first approved for uncontrolled DM, but now has heart disease and Myasthenia Gravis   Social Needs  . Financial resource strain: Not hard at all  . Food insecurity:    Worry: Never true    Inability: Never true  . Transportation needs:    Medical: No    Non-medical: No  Tobacco Use  . Smoking status: Never Smoker  . Smokeless tobacco: Never Used  . Tobacco comment: smoking cessation materials not required  Substance and Sexual Activity  . Alcohol use: Not Currently    Alcohol/week: 0.0 standard drinks  . Drug use: No  . Sexual activity: Not Currently    Partners: Female  Lifestyle  . Physical activity:    Days per week: 7 days    Minutes per session: 30 min  . Stress: Not at all  Relationships  . Social connections:    Talks on phone: More than three times a week    Gets together: Three times a week    Attends religious service: More than 4 times per year    Active member of club or organization: Yes    Attends meetings of clubs or organizations: More than 4 times per year    Relationship status: Married  . Intimate partner violence:    Fear of current or ex partner: No    Emotionally abused: No    Physically abused: No    Forced sexual activity: No  Other Topics Concern  . Not on  file  Social History Narrative  . Not on file     Current Outpatient Medications:  .  albuterol (PROVENTIL HFA;VENTOLIN HFA) 108 (90 Base) MCG/ACT inhaler, Inhale 2 puffs into the lungs every 6 (six) hours as needed for wheezing or shortness of breath., Disp: , Rfl:  .  albuterol (PROVENTIL) (2.5 MG/3ML) 0.083% nebulizer solution, Take 2.5 mg by nebulization every 6 (six) hours as needed for wheezing or shortness of breath., Disp: , Rfl:  .  aspirin EC 81 MG tablet, Take 1 tablet (81 mg total) by mouth daily., Disp: 90 tablet, Rfl: 3 .  BREO ELLIPTA 200-25 MCG/INH AEPB, INHALE 1 PUFF INTO THE LUNGS DAILY (Patient taking differently: Inhale 1 puff into the lungs daily. ), Disp: 1 each, Rfl: 3 .  carvedilol (  COREG) 12.5 MG tablet, Take 1 tablet (12.5 mg total) by mouth 2 (two) times daily., Disp: 180 tablet, Rfl: 1 .  clopidogrel (PLAVIX) 75 MG tablet, Take 1 tablet (75 mg total) by mouth daily with breakfast., Disp: 30 tablet, Rfl: 6 .  cyanocobalamin 1000 MCG tablet, Take 1,000 mcg by mouth daily. , Disp: , Rfl:  .  cyclobenzaprine (FEXMID) 7.5 MG tablet, Take 7.5 mg by mouth 3 (three) times daily as needed for muscle spasms., Disp: , Rfl:  .  dexlansoprazole (DEXILANT) 60 MG capsule, Take 1 capsule (60 mg total) by mouth daily., Disp: 30 capsule, Rfl: 6 .  gabapentin (NEURONTIN) 300 MG capsule, Take 1 capsule (300 mg total) by mouth 3 (three) times daily., Disp: 90 capsule, Rfl: 1 .  insulin lispro (HUMALOG KWIKPEN) 100 UNIT/ML KiwkPen, Inject 7-8 Units into the skin 3 (three) times daily. Reported on 12/02/2015/ sliding scale 1 unit for every 8 units of carbs; and 1 unit for every 20 above 120, Disp: , Rfl:  .  Insulin Pen Needle (FIFTY50 PEN NEEDLES) 32G X 4 MM MISC, Inject 1 each into the skin as needed., Disp: , Rfl:  .  LANTUS SOLOSTAR 100 UNIT/ML Solostar Pen, Inject 25-30 Units into the skin daily at 10 pm. , Disp: , Rfl: 0 .  levocetirizine (XYZAL) 5 MG tablet, Take 1 tablet (5 mg total)  by mouth every evening., Disp: 30 tablet, Rfl: 3 .  liraglutide (VICTOZA) 18 MG/3ML SOPN, 1.8 units every morning (Patient taking differently: Inject 1.8 mg into the skin daily. ), Disp: , Rfl:  .  loratadine (CLARITIN) 10 MG tablet, Take 10 mg by mouth daily. , Disp: , Rfl:  .  metFORMIN (GLUCOPHAGE) 1000 MG tablet, Take 1,000 mg by mouth 2 (two) times daily with a meal., Disp: , Rfl:  .  naloxone (NARCAN) nasal spray 4 mg/0.1 mL, For excess sedation from opioids, Disp: 1 kit, Rfl: 2 .  nitroGLYCERIN (NITROSTAT) 0.3 MG SL tablet, Place 1 tablet (0.3 mg total) under the tongue every 5 (five) minutes as needed for chest pain. Maximum 3 doses., Disp: 25 tablet, Rfl: 3 .  [START ON 10/21/2018] Oxycodone HCl 10 MG TABS, Take 1 tablet (10 mg total) by mouth 4 (four) times daily., Disp: 120 tablet, Rfl: 0 .  rosuvastatin (CRESTOR) 5 MG tablet, Take 5 mg by mouth daily., Disp: , Rfl: 0 .  sacubitril-valsartan (ENTRESTO) 24-26 MG, Take 1 tablet by mouth 2 (two) times daily., Disp: 60 tablet, Rfl: 6 .  ezetimibe (ZETIA) 10 MG tablet, Take 1 tablet (10 mg total) by mouth daily., Disp: 90 tablet, Rfl: 3  Allergies  Allergen Reactions  . Metoprolol Rash  . Novolog [Insulin Aspart] Hives    I personally reviewed active problem list, medication list, allergies, family history, social history with the patient/caregiver today.   ROS  Constitutional: Negative for fever or weight change.  Respiratory: Negative for cough and shortness of breath.  Some chest congestion lately  Cardiovascular: positive or intermittent  chest pain that resolves with nitro but no  palpitations.  Gastrointestinal: Negative for abdominal pain, no bowel changes.  Musculoskeletal: Positive  for gait problem but no  joint swelling.  Skin: Negative for rash.  Neurological: Positive for dizziness and occasional  headache.  No other specific complaints in a complete review of systems (except as listed in HPI  above).  Objective  Vitals:   09/29/18 1004  BP: 110/66  Pulse: 97  Resp: 16  Temp: 97.8  F (36.6 C)  TempSrc: Oral  SpO2: 97%  Weight: 239 lb 6.4 oz (108.6 kg)  Height: '5\' 7"'$  (1.702 m)    Body mass index is 37.5 kg/m.  Physical Exam  Constitutional: Patient appears well-developed and well-nourished. Obese No distress.  HEENT: head atraumatic, normocephalic, pupils equal and reactive to light, neck supple, throat within normal limits, ptosis of left eye  Cardiovascular: Normal rate, regular rhythm and normal heart sounds.  No murmur heard. No BLE edema. Pulmonary/Chest: Effort normal and breath sounds normal. No respiratory distress. Muscular Skeletal: using a cane to assist with ambulation  Abdominal: Soft.  There is no tenderness. Psychiatric: Patient has a normal mood and affect. behavior is normal. Judgment and thought content normal.   PHQ2/9: Depression screen [facility] 2/9 09/29/2018 09/06/2018 08/12/2018 08/10/2018 08/03/2018  Decreased Interest 1 0 0 0 0  Down, Depressed, Hopeless 1 0 0 0 1  PHQ - 2 Score 2 0 0 0 1  Altered sleeping 0 - 0 2 -  Tired, decreased energy 3 - 1 3 -  Change in appetite 2 - 1 0 -  Feeling bad or failure about yourself  1 - 0 0 -  Trouble concentrating 0 - 0 1 -  Moving slowly or fidgety/restless 0 - 0 0 -  Suicidal thoughts 0 - 0 0 -  PHQ-9 Score 8 - 2 6 -  Difficult doing work/chores Somewhat difficult - Somewhat difficult Not difficult at all -  Some recent data might be hidden     Fall Risk: Fall Risk  09/29/2018 09/06/2018 08/12/2018 08/10/2018 08/03/2018  Falls in the past year? 0 0 1 1 Yes  Comment - - - - -  Number falls in past yr: 0 0 1 1 -  Comment - - - - -  Injury with Fall? 0 0 1 1 -  Risk Factor Category  - - - - -  Risk for fall due to : - - History of fall(s);Impaired balance/gait - -  Risk for fall due to: Comment - - - - -  Follow up - - - - -    Assessment & Plan  1. Unstable angina (HCC)  Sees Dr.  Fletcher Anon  2. Myasthenia gravis ([place])  Sees Dr. Melrose Nakayama   3. Balance problem  Worse since hearing problems and seen by ENT, but also because of back pain and uses cane and walker intermittently   4. Acute hearing loss of both ears  Seeing ENT and ID   5. Chronic systolic CHF (congestive heart failure) (HCC)  Stable at this time

## 2018-09-30 ENCOUNTER — Telehealth: Payer: Self-pay | Admitting: Cardiovascular Disease

## 2018-09-30 ENCOUNTER — Encounter: Payer: Medicare Other | Admitting: Family Medicine

## 2018-09-30 DIAGNOSIS — H905 Unspecified sensorineural hearing loss: Secondary | ICD-10-CM | POA: Diagnosis not present

## 2018-09-30 DIAGNOSIS — H9120 Sudden idiopathic hearing loss, unspecified ear: Secondary | ICD-10-CM | POA: Diagnosis not present

## 2018-09-30 DIAGNOSIS — R42 Dizziness and giddiness: Secondary | ICD-10-CM | POA: Diagnosis not present

## 2018-09-30 DIAGNOSIS — H8110 Benign paroxysmal vertigo, unspecified ear: Secondary | ICD-10-CM | POA: Diagnosis not present

## 2018-09-30 NOTE — Telephone Encounter (Signed)
Recieved request from : Patient for [place] Scanned to release   Forwarded to [employer] for processing

## 2018-09-30 NOTE — Progress Notes (Signed)
[name] ([id number]) Visit Report for 09/26/2018 HBO Details Patient Name: [name], [name] Date of Service: 09/26/2018 1:00 PM Medical Record Number: [id number] Patient Account Number: [id number] Date of Birth/Sex: 1961-05-08 (57 y.o. M) Treating RN: Harold Barban Primary Care Rosa Gambale: [name] Other Clinician: Jacqulyn Bath Referring Jahnay Lantier: [name] Treating Tonnette Zwiebel/Extender: Melburn Hake, HOYT Weeks in Treatment: 0 HBO Treatment Course Details Treatment Course Number: 1 Ordering Falecia Vannatter: STONE III, HOYT Total Treatments Ordered: 20 HBO Treatment Start Date: 09/21/2018 HBO Indication: Other (specify in Notes) Acute Sensorineural Hearing Notes: Loss HBO Treatment Details Treatment Number: 4 Patient Type: Outpatient Chamber Type: Monoplace Chamber Serial #: [id number] Treatment Protocol: 2.0 ATA with 90 minutes oxygen, and no air breaks Treatment Details Compression Rate Down: 1.5 psi / minute De-Compression Rate Up: 2.0 psi / minute Compress Tx Pressure Air breaks and breathing periods Decompress Decompress Begins Reached (leave unused spaces blank) Begins Ends Chamber Pressure (ATA) 1 2 - - - - - - 2 1 Clock Time (24 hr) 13:05 13:17 - - - - - - 14:47 14:55 Treatment Length: 110 (minutes) Treatment Segments: 4 Capillary Blood Glucose Pre Capillary Blood Glucose (mg/dl): Post Capillary Blood Glucose (mg/dl): Vital Signs Capillary Blood Glucose Reference Range: 80 - 120 mg / dl HBO Diabetic Blood Glucose Intervention Range: <131 mg/dl or >249 mg/dl Time Vitals Blood Respiratory Capillary Blood Glucose Pulse Action Type: Pulse: Temperature: Taken: Pressure: Rate: Glucose (mg/dl): Meter #: Oximetry (%) Taken: Pre 12:50 128/84 102 16 97.9 188 1 none per protocol Post 15:02 124/72 96 16 97.7 150 1 none per protocol Treatment Response Treatment Completion Status: Treatment Completed without Adverse Event HBO Attestation I certify that I  supervised this HBO treatment in accordance with Medicare guidelines. A trained emergency response Yes team is readily available per hospital policies and procedures. Continue HBOT as ordered. [address] [name] ([id number]) Electronic Signature(s) Signed: 09/29/2018 10:16:39 PM By: Worthy Keeler PA-C Previous Signature: 09/26/2018 3:57:44 PM Version By: Lorine Bears RCP, RRT, CHT Entered By: Worthy Keeler on 09/29/2018 22:15:13 [name] ([id number]) -------------------------------------------------------------------------------- HBO Safety Checklist Details Patient Name: [name] Date of Service: 09/26/2018 1:00 PM Medical Record Number: [id number] Patient Account Number: [id number] Date of Birth/Sex: [birth year] (57 y.o. M) Treating RN: Harold Barban Primary Care Desjuan Stearns: [name] Other Clinician: Jacqulyn Bath Referring Gerarda Conklin: [name] Treating Keandria Berrocal/Extender: Melburn Hake, HOYT Weeks in Treatment: 0 HBO Safety Checklist Items Safety Checklist Consent Form Signed Patient voided / foley secured and emptied When did you last eato 11:00am on 09/26/18 Last dose of injectable or oral agent 11:00 am on 09/26/18 Ostomy pouch emptied and vented if applicable NA All implantable devices assessed, documented and approved NA Intravenous access site secured and place NA Valuables secured Linens and cotton and cotton/polyester blend (less than 51% polyester) Personal oil-based products / skin lotions / body lotions removed Wigs or hairpieces removed NA Smoking or tobacco materials removed NA Books / newspapers / magazines / loose paper removed Cologne, aftershave, perfume and deodorant removed Jewelry removed (may wrap wedding band) Make-up removed NA Hair care products removed Battery operated devices (external) removed Heating patches and chemical warmers removed Titanium eyewear removed NA Nail polish cured  greater than 10 hours NA Casting material cured greater than 10 hours NA Hearing aids removed NA Loose dentures or partials removed Prosthetics have been removed NA Patient demonstrates correct use of air break device (if applicable) Patient concerns have been addressed Patient grounding bracelet on and cord  attached to chamber Specifics for Inpatients (complete in addition to above) Medication sheet sent with patient Intravenous medications needed or due during therapy sent with patient Drainage tubes (e.g. nasogastric tube or chest tube secured and vented) Endotracheal or Tracheotomy tube secured Cuff deflated of air and inflated with saline Airway suctioned Electronic Signature(s) Signed: 09/26/2018 3:57:44 PM By: Lorine Bears RCP, RRT, CHT [place], Greogory M. ([id number]) Entered By: Lorine Bears on 09/26/2018 13:18:10

## 2018-09-30 NOTE — Progress Notes (Signed)
ABHIRAM, CRIADO ([id number]) Visit Report for 09/26/2018 Problem List Details Patient Name: [name], [name] Date of Service: 09/26/2018 1:00 PM Medical Record Number: [id number] Patient Account Number: [id number] Date of Birth/Sex: Mar 31, 1961 (57 y.o. 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, unilateral, left ear, with 09/20/2018 No Yes unrestricted hearing on the contralateral side E11.42 Type 2 diabetes mellitus with diabetic polyneuropathy 09/20/2018 No Yes I10 Essential (primary) hypertension 09/20/2018 No Yes A69.29 Other conditions associated with Lyme disease 09/20/2018 No Yes I43 Cardiomyopathy in diseases classified elsewhere 09/20/2018 No Yes 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, unilateral, left ear, with unrestricted hearing on the contralateral side E11.42 Type 2  diabetes mellitus with diabetic polyneuropathy I10 Essential (primary) hypertension A69.29 Other conditions associated with Lyme disease I43 Cardiomyopathy in diseases classified elsewhere Facility Procedures CPT4 Code: 10175102 Description: (Facility Use Only) HBOT, full body chamber, 25min Modifier: Quantity: 4 Physician Procedures CPT4: Description Modifier Quantity Code [id number] [id number] - WC PHYS HYPERBARIC OXYGEN THERAPY 1 ICD-10 Diagnosis Description 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

## 2018-10-03 ENCOUNTER — Encounter: Payer: Medicare Other | Admitting: Family Medicine

## 2018-10-03 DIAGNOSIS — E1142 Type 2 diabetes mellitus with diabetic polyneuropathy: Secondary | ICD-10-CM | POA: Diagnosis not present

## 2018-10-03 DIAGNOSIS — H9123 Sudden idiopathic hearing loss, bilateral: Secondary | ICD-10-CM | POA: Diagnosis not present

## 2018-10-03 DIAGNOSIS — I2510 Atherosclerotic heart disease of native coronary artery without angina pectoris: Secondary | ICD-10-CM | POA: Diagnosis not present

## 2018-10-03 DIAGNOSIS — I429 Cardiomyopathy, unspecified: Secondary | ICD-10-CM | POA: Diagnosis not present

## 2018-10-03 DIAGNOSIS — I110 Hypertensive heart disease with heart failure: Secondary | ICD-10-CM | POA: Diagnosis not present

## 2018-10-03 DIAGNOSIS — Z8249 Family history of ischemic heart disease and other diseases of the circulatory system: Secondary | ICD-10-CM | POA: Diagnosis not present

## 2018-10-03 DIAGNOSIS — I5042 Chronic combined systolic (congestive) and diastolic (congestive) heart failure: Secondary | ICD-10-CM | POA: Diagnosis not present

## 2018-10-03 DIAGNOSIS — Z955 Presence of coronary angioplasty implant and graft: Secondary | ICD-10-CM | POA: Diagnosis not present

## 2018-10-03 DIAGNOSIS — H9042 Sensorineural hearing loss, unilateral, left ear, with unrestricted hearing on the contralateral side: Secondary | ICD-10-CM | POA: Diagnosis not present

## 2018-10-03 DIAGNOSIS — A6929 Other conditions associated with Lyme disease: Secondary | ICD-10-CM | POA: Diagnosis not present

## 2018-10-03 DIAGNOSIS — J45909 Unspecified asthma, uncomplicated: Secondary | ICD-10-CM | POA: Diagnosis not present

## 2018-10-03 DIAGNOSIS — I252 Old myocardial infarction: Secondary | ICD-10-CM | POA: Diagnosis not present

## 2018-10-03 LAB — GLUCOSE, CAPILLARY
Glucose-Capillary: 281 mg/dL — ABNORMAL HIGH (ref 70–99)
Glucose-Capillary: 210 mg/dL — ABNORMAL HIGH (ref 70–99)

## 2018-10-03 NOTE — Progress Notes (Signed)
MERLYN, CONLEY ([id number]) Visit Report for 10/03/2018 Arrival Information Details Patient Name: [name], [name] Date of Service: 10/03/2018 1:00 PM Medical Record Number: [id number] Patient Account Number: [id number] Date of Birth/Sex: [birth year] (57 y.o. M) Treating RN: Harold Barban Primary Care Bernis Stecher: [name] Other Clinician: Jacqulyn Bath Referring Marvon Shillingburg: [name] Treating Shron Ozer/Extender: Oneida Arenas in Treatment: 1 Visit Information History Since Last Visit Added or deleted any medications: No Patient Arrived: [name] Any new allergies or adverse reactions: No Arrival Time: 13:00 Had a fall or experienced change in No Accompanied By: self activities of daily living that may affect Transfer Assistance: None risk of falls: Patient Identification Verified: Yes Signs or symptoms of abuse/neglect since last visito No Secondary Verification Process Completed: Yes Hospitalized since last visit: No Implantable device outside of the clinic excluding No cellular tissue based products placed in the center since last visit: Pain Present Now: No Electronic Signature(s) Signed: 10/03/2018 3:55:25 PM By: Lorine Bears RCP, RRT, CHT Entered By: Lorine Bears on 10/03/2018 13:19:14 [name] ([id number]) -------------------------------------------------------------------------------- Encounter Discharge Information Details Patient Name: [name] Date of Service: 10/03/2018 1:00 PM Medical Record Number: [id number] Patient Account Number: [id number] Date of Birth/Sex: [birth year] (57 y.o. M) Treating RN: Harold Barban Primary Care Ameerah Huffstetler: [name] Other Clinician: Jacqulyn Bath Referring Shanekqua Schaper: [name] Treating Jala Dundon/Extender: Oneida Arenas in Treatment: 1 Encounter Discharge Information Items Discharge Condition: Stable Ambulatory Status: Cane Discharge  Destination: Home Transportation: Private Auto Accompanied By: self Schedule Follow-up Appointment: No Clinical Summary of Care: Notes Patient has an HBO treatment scheduled on 10/04/18 at 13:00 am. Electronic Signature(s) Signed: 10/03/2018 3:55:25 PM By: Lorine Bears RCP, RRT, CHT Entered By: Lorine Bears on 10/03/2018 15:29:15 [name] ([id number]) -------------------------------------------------------------------------------- Patient/Caregiver Education Details Patient Name: [name] Date of Service: 10/03/2018 1:00 PM Medical Record Number: [id number] Patient Account Number: [id number] Date of Birth/Gender: 1960-12-30 (57 y.o. M) Treating RN: Harold Barban Primary Care Physician: [name] Other Clinician: Jacqulyn Bath Referring Physician: [name] Treating Physician/Extender: Oneida Arenas in Treatment: 1 Education Assessment Education Provided To: Patient Education Topics Provided Hyperbaric Oxygenation: Responses: State content correctly Electronic Signature(s) Signed: 10/03/2018 3:55:25 PM By: Lorine Bears RCP, RRT, CHT Entered By: Lorine Bears on 10/03/2018 15:28:17 [name] ([id number]) -------------------------------------------------------------------------------- Vitals Details Patient Name: [name] Date of Service: 10/03/2018 1:00 PM Medical Record Number: [id number] Patient Account Number: [id number] Date of Birth/Sex: [birth year] (57 y.o. M) Treating RN: Harold Barban Primary Care Keison Glendinning: [name] Other Clinician: Jacqulyn Bath Referring Meera Vasco: [name] Treating Tarry Fountain/Extender: Beather Arbour Weeks in Treatment: 1 Vital Signs Time Taken: 13:02 Temperature (F): 97.9 Height (in): 67 Pulse (bpm): 78 Weight (lbs): 232 Respiratory Rate (breaths/min): 16 Body Mass Index (BMI): 36.3 Blood Pressure  (mmHg): 132/82 Capillary Blood Glucose (mg/dl): 281 Reference Range: 80 - 120 mg / dl Electronic Signature(s) Signed: 10/03/2018 3:55:25 PM By: Lorine Bears RCP, RRT, CHT Entered By: Lorine Bears on 10/03/2018 13:20:59

## 2018-10-04 ENCOUNTER — Encounter: Payer: Medicare Other | Admitting: Family Medicine

## 2018-10-04 NOTE — Progress Notes (Signed)
DELFINO, FRIESEN ([id number]) Visit Report for 10/03/2018 HBO Details Patient Name: [name], [name] Date of Service: 10/03/2018 1:00 PM Medical Record Number: [id number] Patient Account Number: [id number] Date of Birth/Sex: [birth year] (57 y.o. M) Treating RN: Harold Barban Primary Care Gabbrielle Mcnicholas: [name] Other Clinician: Jacqulyn Bath Referring Walther Sanagustin: [name] Treating Mykael Trott/Extender: Oneida Arenas in Treatment: 1 HBO Treatment Course Details Treatment Course Number: 1 Ordering Rheda Kassab: STONE III, HOYT Total Treatments Ordered: 20 HBO Treatment Start Date: 09/21/2018 HBO Indication: Other (specify in Notes) Acute Sensorineural Hearing Notes: Loss HBO Treatment Details Treatment Number: 6 Patient Type: Outpatient Chamber Type: Monoplace Chamber Serial #: [id number] Treatment Protocol: 2.0 ATA with 90 minutes oxygen, and no air breaks Treatment Details Compression Rate Down: 1.5 psi / minute De-Compression Rate Up: 2.0 psi / minute Compress Tx Pressure Air breaks and breathing periods Decompress Decompress Begins Reached (leave unused spaces blank) Begins Ends Chamber Pressure (ATA) 1 2 - - - - - - 2 1 Clock Time (24 hr) 13:15 13:26 - - - - - - 14:56 15:04 Treatment Length: 109 (minutes) Treatment Segments: 4 Capillary Blood Glucose Pre Capillary Blood Glucose (mg/dl): Post Capillary Blood Glucose (mg/dl): Vital Signs Capillary Blood Glucose Reference Range: 80 - 120 mg / dl HBO Diabetic Blood Glucose Intervention Range: <131 mg/dl or >249 mg/dl Time Vitals Blood Respiratory Capillary Blood Glucose Pulse Action Type: Pulse: Temperature: Taken: Pressure: Rate: Glucose (mg/dl): Meter #: Oximetry (%) Taken: Pre 13:02 132/82 78 16 97.9 281 1 none per protocol Post 15:09 146/90 72 16 97.9 210 none per protocol Treatment Response Treatment Completion Status: Treatment Completed without Adverse Event Electronic Signature(s) Signed:  10/03/2018 3:55:25 PM By: Lorine Bears RCP, RRT, CHT Signed: 10/03/2018 10:32:40 PM By: Beather Arbour FNP-C Entered By: Lorine Bears on 10/03/2018 15:27:00 [name], [name] ([id number]) [name], [name] ([id number]) -------------------------------------------------------------------------------- HBO Safety Checklist Details Patient Name: [name] Date of Service: 10/03/2018 1:00 PM Medical Record Number: [id number] Patient Account Number: [id number] Date of Birth/Sex: 03/02/1961 (57 y.o. M) Treating RN: Harold Barban Primary Care Alysiah Suppa: [name] Other Clinician: Jacqulyn Bath Referring Sharlize Hoar: [name] Treating Danicka Hourihan/Extender: Beather Arbour Weeks in Treatment: 1 HBO Safety Checklist Items Safety Checklist Consent Form Signed Patient voided / foley secured and emptied When did you last eato 10/03/18 noon Last dose of injectable or oral agent 10/03/18 noon Ostomy pouch emptied and vented if applicable NA All implantable devices assessed, documented and approved NA Intravenous access site secured and place NA Valuables secured Linens and cotton and cotton/polyester blend (less than 51% polyester) Personal oil-based products / skin lotions / body lotions removed Wigs or hairpieces removed NA Smoking or tobacco materials removed NA Books / newspapers / magazines / loose paper removed Cologne, aftershave, perfume and deodorant removed Jewelry removed (may wrap wedding band) Make-up removed NA Hair care products removed Battery operated devices (external) removed Heating patches and chemical warmers removed Titanium eyewear removed NA Nail polish cured greater than 10 hours NA Casting material cured greater than 10 hours NA Hearing aids removed NA Loose dentures or partials removed Prosthetics have been removed NA Patient demonstrates correct use of air break device (if applicable) Patient  concerns have been addressed Patient grounding bracelet on and cord attached to chamber Specifics for Inpatients (complete in addition to above) Medication sheet sent with patient Intravenous medications needed or due during therapy sent with patient Drainage tubes (e.g. nasogastric tube or chest tube secured and vented) Endotracheal or Tracheotomy tube secured Cuff  deflated of air and inflated with saline Airway suctioned Electronic Signature(s) Signed: 10/03/2018 3:55:25 PM By: Lorine Bears RCP, RRT, CHT [name], [name]. ([id number]) Entered By: Lorine Bears on 10/03/2018 13:23:12

## 2018-10-06 ENCOUNTER — Telehealth: Payer: Self-pay | Admitting: Cardiovascular Disease

## 2018-10-06 DIAGNOSIS — E1142 Type 2 diabetes mellitus with diabetic polyneuropathy: Secondary | ICD-10-CM | POA: Diagnosis not present

## 2018-10-06 DIAGNOSIS — Z794 Long term (current) use of insulin: Secondary | ICD-10-CM | POA: Diagnosis not present

## 2018-10-06 DIAGNOSIS — E785 Hyperlipidemia, unspecified: Secondary | ICD-10-CM | POA: Diagnosis not present

## 2018-10-06 DIAGNOSIS — E1159 Type 2 diabetes mellitus with other circulatory complications: Secondary | ICD-10-CM | POA: Diagnosis not present

## 2018-10-06 DIAGNOSIS — E1169 Type 2 diabetes mellitus with other specified complication: Secondary | ICD-10-CM | POA: Diagnosis not present

## 2018-10-06 DIAGNOSIS — I5042 Chronic combined systolic (congestive) and diastolic (congestive) heart failure: Secondary | ICD-10-CM

## 2018-10-06 LAB — HEMOGLOBIN A1C: Hemoglobin A1C: 7.8

## 2018-10-06 MED ORDER — FUROSEMIDE 20 MG PO TABS: 20.0000 mg | ORAL_TABLET | Freq: Every day | ORAL | 3 refills | Status: DC

## 2018-10-06 NOTE — Telephone Encounter (Signed)
Pt c/o swelling: STAT is pt has developed SOB within 24 hours  1) How much weight have you gained and in what time span? 10 pounds in 3 days  2) If swelling, where is the swelling located? Both hands, left more than right, feet also.   3) Are you currently taking a fluid pill? no  4) Are you currently SOB? no  5) Do you have a log of your daily weights (if so, list)? 12/31 -230, yesterday 233, today 240   6) Have you gained 3 pounds in a day or 5 pounds in a week? yes  7) Have you traveled recently? no

## 2018-10-06 NOTE — Telephone Encounter (Signed)
The patient has been made aware to start furosemide 20 mg daily and will go to [facility] next week for a BMET.  He will call back if the furosemide is not effective.

## 2018-10-06 NOTE — Telephone Encounter (Signed)
I spoke with the patient. He states that he has had a 10 lb weight gain in the last 3 days. He confirms his baseline weight since October has been 230 lbs.  He was 230 lbs on 12/30 and today he is 240 lbs. He advises that his urine output/ color is about the same as his normal. He has been cautious with his diet over the holidays and has maintained a 64 oz fluid restriction.  He confirms he is having some swelling to his hands, abdomen, and feet.  He is not SOB with walking/ at rest. He feels like he is congested/ wheezing. Per the patient, his only change as of late is that he started treatments for his hearing by going to a hyperbaric chamber for 2 hour sessions.  He is exercising more.   The patient is currently not taking any diuretics at home, but does confirm he has lasix 40 mg tablets at home. This was stopped back in October per the patient.  He has been afebrile. His BP (HR) have been 126/68 (94) on average. .  I have advised the patient I will review with Dr. Fletcher Anon and call him back with further recommendations.  The patient voices understanding and is agreeable.

## 2018-10-06 NOTE — Telephone Encounter (Signed)
Resume furosemide at a lower dose of 20 mg once daily.  Check basic metabolic profile in 1 week.

## 2018-10-10 DIAGNOSIS — Z794 Long term (current) use of insulin: Secondary | ICD-10-CM | POA: Diagnosis not present

## 2018-10-10 DIAGNOSIS — E1165 Type 2 diabetes mellitus with hyperglycemia: Secondary | ICD-10-CM | POA: Diagnosis not present

## 2018-10-11 DIAGNOSIS — R42 Dizziness and giddiness: Secondary | ICD-10-CM | POA: Diagnosis not present

## 2018-10-19 ENCOUNTER — Telehealth: Payer: Self-pay | Admitting: *Deleted

## 2018-10-19 ENCOUNTER — Other Ambulatory Visit
Admission: RE | Admit: 2018-10-19 | Discharge: 2018-10-19 | Disposition: A | Discharge status: Home / Self Care | Payer: Medicare Other | Source: Ambulatory Visit | Attending: Cardiovascular Disease | Admitting: Cardiovascular Disease

## 2018-10-19 DIAGNOSIS — I5042 Chronic combined systolic (congestive) and diastolic (congestive) heart failure: Secondary | ICD-10-CM | POA: Insufficient documentation

## 2018-10-19 LAB — BASIC METABOLIC PANEL
Anion gap: 7 (ref 5–15)
BUN: 28 mg/dL — AB (ref 6–20)
CO2: 30 mmol/L (ref 22–32)
Calcium: 8.8 mg/dL — ABNORMAL LOW (ref 8.9–10.3)
Chloride: 104 mmol/L (ref 98–111)
Creatinine, Ser: 1.41 mg/dL — ABNORMAL HIGH (ref 0.61–1.24)
GFR calc Af Amer: >60 mL/min (ref >60)
GFR calc non Af Amer: 54 mL/min — ABNORMAL LOW (ref >60)
GLUCOSE: 138 mg/dL — AB (ref 70–99)
Potassium: 4 mmol/L (ref 3.5–5.1)
Sodium: 141 mmol/L (ref 135–145)

## 2018-10-19 MED ORDER — FUROSEMIDE 20 MG PO TABS: 10.0000 mg | ORAL_TABLET | Freq: Every day | ORAL | 3 refills | Status: DC

## 2018-10-19 NOTE — Telephone Encounter (Signed)
Patient made aware of results and verbalized understanding. The patient stated that his swelling is much better. Orders have been placed for repeat labs.

## 2018-10-19 NOTE — Telephone Encounter (Signed)
-----   Message from Theora Gianotti, NP sent at 10/19/2018  1:19 PM EST ----- Creat up slightly on lasix. Has swelling improved.  If so, would recommend cutting back to 1/2 tab daily. F/u bmet in a week.  If swelling/wt unchanged, we may need to switch to torsemide.

## 2018-10-21 ENCOUNTER — Telehealth: Payer: Self-pay

## 2018-10-21 NOTE — Telephone Encounter (Signed)
Do you have the forms ?

## 2018-10-21 NOTE — Telephone Encounter (Signed)
Patient calling to check on status  [employer] to contact patient to follow up

## 2018-10-21 NOTE — Telephone Encounter (Signed)
Copied from [place]. Topic: General - Other >> Oct 21, 2018 11:29 AM Judyann Munson wrote: Reason for CRM: Patient is calling to state he came in on 09-23-18 with forms from  [facility]. The patient stated the form was completed but Dr.Sowles did not sign the back of the form and the form was not faxed in. The patient stated at this time his benefits have been terminated. He is request to have this completed as soon as possible.the patient is requesting a call back with a update status.   The fax number which this forms need to be sent to is [phone]  Attention to Daiva Huge Number : [phone] ( Needs to be in top right of the  form)

## 2018-10-21 NOTE — Telephone Encounter (Signed)
Forms have been resigned by PCP and faxed.

## 2018-10-24 DIAGNOSIS — R42 Dizziness and giddiness: Secondary | ICD-10-CM | POA: Diagnosis not present

## 2018-10-28 ENCOUNTER — Other Ambulatory Visit: Payer: Self-pay | Admitting: Nurse Practitioner

## 2018-10-28 DIAGNOSIS — J302 Other seasonal allergic rhinitis: Secondary | ICD-10-CM

## 2018-10-31 DIAGNOSIS — G7000 Myasthenia gravis without (acute) exacerbation: Secondary | ICD-10-CM | POA: Diagnosis not present

## 2018-10-31 NOTE — Telephone Encounter (Signed)
Received forms from [facility] for physician to sign. Places in nurses box/sg

## 2018-10-31 NOTE — Telephone Encounter (Signed)
Forms placed in the provider's box.

## 2018-11-01 DIAGNOSIS — H9041 Sensorineural hearing loss, unilateral, right ear, with unrestricted hearing on the contralateral side: Secondary | ICD-10-CM | POA: Diagnosis not present

## 2018-11-01 DIAGNOSIS — H9122 Sudden idiopathic hearing loss, left ear: Secondary | ICD-10-CM | POA: Diagnosis not present

## 2018-11-01 NOTE — Telephone Encounter (Signed)
Forms have been signed and given back to the staff up front [facility])

## 2018-11-01 NOTE — Telephone Encounter (Signed)
Sent interoffice mail to [employer]

## 2018-11-08 ENCOUNTER — Encounter: Payer: Medicare Other | Admitting: Anesthesiology

## 2018-11-09 DIAGNOSIS — H9122 Sudden idiopathic hearing loss, left ear: Secondary | ICD-10-CM | POA: Diagnosis not present

## 2018-11-10 DIAGNOSIS — E1165 Type 2 diabetes mellitus with hyperglycemia: Secondary | ICD-10-CM | POA: Diagnosis not present

## 2018-11-10 DIAGNOSIS — Z794 Long term (current) use of insulin: Secondary | ICD-10-CM | POA: Diagnosis not present

## 2018-11-16 DIAGNOSIS — G7000 Myasthenia gravis without (acute) exacerbation: Secondary | ICD-10-CM | POA: Diagnosis not present

## 2018-11-16 DIAGNOSIS — R531 Weakness: Secondary | ICD-10-CM | POA: Diagnosis not present

## 2018-11-16 DIAGNOSIS — Z794 Long term (current) use of insulin: Secondary | ICD-10-CM | POA: Diagnosis not present

## 2018-11-16 DIAGNOSIS — E119 Type 2 diabetes mellitus without complications: Secondary | ICD-10-CM | POA: Diagnosis not present

## 2018-11-16 DIAGNOSIS — R5381 Other malaise: Secondary | ICD-10-CM | POA: Diagnosis not present

## 2018-11-17 ENCOUNTER — Other Ambulatory Visit: Payer: Self-pay

## 2018-11-17 ENCOUNTER — Encounter: Payer: Self-pay | Admitting: Anesthesiology

## 2018-11-17 ENCOUNTER — Ambulatory Visit: Payer: Medicare Other | Attending: Anesthesiology | Admitting: Anesthesiology

## 2018-11-17 VITALS — BP 114/80 | HR 105 | Temp 97.8°F | Resp 16 | Ht 67.0 in | Wt 239.0 lb

## 2018-11-17 DIAGNOSIS — F1190 Opioid use, unspecified, uncomplicated: Secondary | ICD-10-CM | POA: Diagnosis present

## 2018-11-17 DIAGNOSIS — G7000 Myasthenia gravis without (acute) exacerbation: Secondary | ICD-10-CM

## 2018-11-17 DIAGNOSIS — A6923 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: Secondary | ICD-10-CM | POA: Diagnosis not present

## 2018-11-17 DIAGNOSIS — M4716 Other spondylosis with myelopathy, lumbar region: Secondary | ICD-10-CM

## 2018-11-17 DIAGNOSIS — M542 Cervicalgia: Secondary | ICD-10-CM

## 2018-11-17 DIAGNOSIS — G894 Chronic pain syndrome: Secondary | ICD-10-CM

## 2018-11-17 DIAGNOSIS — M47817 Spondylosis without myelopathy or radiculopathy, lumbosacral region: Secondary | ICD-10-CM | POA: Diagnosis not present

## 2018-11-17 DIAGNOSIS — M5136 Other intervertebral disc degeneration, lumbar region: Secondary | ICD-10-CM | POA: Diagnosis not present

## 2018-11-17 MED ORDER — OXYCODONE HCL 10 MG PO TABS: 10.0000 mg | ORAL_TABLET | Freq: Four times a day (QID) | ORAL | 0 refills | Status: DC

## 2018-11-17 NOTE — Progress Notes (Signed)
Nursing Pain Medication Assessment:  Safety precautions to be maintained throughout the outpatient stay will include: orient to surroundings, keep bed in low position, maintain call bell within reach at all times, provide assistance with transfer out of bed and ambulation.  Medication Inspection Compliance: Pill count conducted under aseptic conditions, in front of the patient. Neither the pills nor the bottle was removed from the patient's sight at any time. Once count was completed pills were immediately returned to the patient in their original bottle.  Medication: Oxycodone IR Pill/Patch Count: 14 of 120 pills remain Pill/Patch Appearance: Markings consistent with prescribed medication Bottle Appearance: Standard pharmacy container. Clearly labeled. Filled Date: 01/17 / 2020 Last Medication intake:  Today

## 2018-11-17 NOTE — Patient Instructions (Signed)
You were given 2 prescriptions for Oxycodone. 

## 2018-11-17 NOTE — Progress Notes (Signed)
Subjective:  Patient ID: [name], male    DOB: 30-Dec-1960  Age: 58 y.o. MRN: [id number]  CC: Back Pain (left, lower)   Procedure: None  HPI [name] presents for reevaluation.  His last seen 2 months ago and is been doing well.  The quality characteristic distribution of his low back pain is been stable in nature.  Is primarily in the left lower back with severe radiating pain into the left hip and buttock region.  He has been on Percocet for this at the 10 mg strength taking this 4 times a day and this keeps his pain under good control.  Unfortunately he has failed conservative therapy and anti-inflammatories stretching strengthening and physical therapy have been insufficient to keep his pain under good control.  He has required oxycodone secondary to the Lyme disease with associated hip pain low back pain and radiating left leg pain that is been longstanding in nature.  The quality of the pain has been stable but difficult to control with conservative therapy.  He reports good relief with the oxycodone and no side effects based on other narcotic assessment sheet.  Outpatient Medications Prior to Visit  Medication Sig Dispense Refill  . albuterol (PROVENTIL HFA;VENTOLIN HFA) 108 (90 Base) MCG/ACT inhaler Inhale 2 puffs into the lungs every 6 (six) hours as needed for wheezing or shortness of breath.    [name] albuterol (PROVENTIL) (2.5 MG/3ML) 0.083% nebulizer solution Take 2.5 mg by nebulization every 6 (six) hours as needed for wheezing or shortness of breath.    [name] aspirin EC 81 MG tablet Take 1 tablet (81 mg total) by mouth daily. 90 tablet 3  . BREO ELLIPTA 200-25 MCG/INH AEPB INHALE 1 PUFF INTO THE LUNGS DAILY (Patient taking differently: Inhale 1 puff into the lungs daily. ) 1 each 3  . carvedilol (COREG) 12.5 MG tablet Take 1 tablet (12.5 mg total) by mouth 2 (two) times daily. 180 tablet 1  . clopidogrel (PLAVIX) 75 MG tablet Take 1 tablet (75 mg total) by mouth daily with  breakfast. 30 tablet 6  . cyanocobalamin 1000 MCG tablet Take 1,000 mcg by mouth daily.     . cyclobenzaprine (FEXMID) 7.5 MG tablet Take 7.5 mg by mouth 3 (three) times daily as needed for muscle spasms.    [name] dexlansoprazole (DEXILANT) 60 MG capsule Take 1 capsule (60 mg total) by mouth daily. 30 capsule 6  . furosemide (LASIX) 20 MG tablet Take 0.5 tablets (10 mg total) by mouth daily. (Patient taking differently: Take 10 mg by mouth daily. Take 1/4 tablet daily) 90 tablet 3  . gabapentin (NEURONTIN) 300 MG capsule Take 1 capsule (300 mg total) by mouth 3 (three) times daily. 90 capsule 1  . insulin lispro (HUMALOG KWIKPEN) 100 UNIT/ML KiwkPen Inject 7-8 Units into the skin 3 (three) times daily. Reported on 12/02/2015/ sliding scale 1 unit for every 8 units of carbs; and 1 unit for every 20 above 120    . Insulin Pen Needle (FIFTY50 PEN NEEDLES) 32G X 4 MM MISC Inject 1 each into the skin as needed.    [name] LANTUS SOLOSTAR 100 UNIT/ML Solostar Pen Inject 25-30 Units into the skin daily at 10 pm.   0  . levocetirizine (XYZAL) 5 MG tablet TAKE 1 TABLET(5 MG) BY MOUTH EVERY EVENING 30 tablet 3  . liraglutide (VICTOZA) 18 MG/3ML SOPN 1.8 units every morning (Patient taking differently: Inject 1.8 mg into the skin daily. )    . loratadine (  CLARITIN) 10 MG tablet Take 10 mg by mouth daily.     . metFORMIN (GLUCOPHAGE) 1000 MG tablet Take 1,000 mg by mouth 2 (two) times daily with a meal.    . naloxone (NARCAN) nasal spray 4 mg/0.1 mL For excess sedation from opioids 1 kit 2  . nitroGLYCERIN (NITROSTAT) 0.3 MG SL tablet Place 1 tablet (0.3 mg total) under the tongue every 5 (five) minutes as needed for chest pain. Maximum 3 doses. 25 tablet 3  . rosuvastatin (CRESTOR) 5 MG tablet Take 5 mg by mouth daily.  0  . sacubitril-valsartan (ENTRESTO) 24-26 MG Take 1 tablet by mouth 2 (two) times daily. 60 tablet 6  . Oxycodone HCl 10 MG TABS Take 1 tablet (10 mg total) by mouth 4 (four) times daily. 120 tablet 0   . ezetimibe (ZETIA) 10 MG tablet Take 1 tablet (10 mg total) by mouth daily. 90 tablet 3   No facility-administered medications prior to visit.     Review of Systems CNS: No confusion or sedation Cardiac: No angina or palpitations GI: No abdominal pain or constipation Constitutional: No nausea vomiting fevers or chills  Objective:  BP 114/80   Pulse (!) 105   Temp 97.8 F (36.6 C) (Oral)   Resp 16   Ht '5\' 7"'$  (1.702 m)   Wt 239 lb (108.4 kg)   SpO2 97%   BMI 37.43 kg/m    BP Readings from Last 3 Encounters:  11/17/18 114/80  09/29/18 110/66  09/20/18 (!) 141/87     Wt Readings from Last 3 Encounters:  11/17/18 239 lb (108.4 kg)  09/29/18 239 lb 6.4 oz (108.6 kg)  09/20/18 232 lb (105.2 kg)     Physical Exam Pt is alert and oriented PERRL EOMI HEART IS RRR no murmur or rub LCTA no wheezing or rales MUSCULOSKELETAL reveals some paraspinous muscle tenderness but no overt trigger points.  His muscle tone and bulk is at baseline.  He walks with an antalgic gait.  Labs  Lab Results  Component Value Date   HGBA1C 7.9 (A) 06/01/2018   HGBA1C 7.9 (A) 05/18/2018   HGBA1C 7.2 01/25/2018   Lab Results  Component Value Date   MICROALBUR 204 05/18/2018   LDLCALC 50 05/18/2018   CREATININE 1.41 (H) 10/19/2018    -------------------------------------------------------------------------------------------------------------------- Lab Results  Component Value Date   WBC 8.8 08/10/2018   HGB 10.3 (L) 08/10/2018   HCT 32.0 (L) 08/10/2018   PLT 332 08/10/2018   GLUCOSE 138 (H) 10/19/2018   CHOL 109 05/18/2018   TRIG 112 05/18/2018   HDL 37 05/18/2018   LDLCALC 50 05/18/2018   ALT 17 08/03/2018   AST 24 08/03/2018   NA 141 10/19/2018   K 4.0 10/19/2018   CL 104 10/19/2018   CREATININE 1.41 (H) 10/19/2018   BUN 28 (H) 10/19/2018   CO2 30 10/19/2018   TSH 4.130 11/25/2017   INR 1.26 07/07/2018   HGBA1C 7.9 (A) 06/01/2018   MICROALBUR 204 05/18/2018     --------------------------------------------------------------------------------------------------------------------- No results found.   Assessment & Plan:   Kento was seen today for back pain.  Diagnoses and all orders for this visit:  Cervicalgia  Facet arthritis of lumbosacral region  Chronic pain syndrome -     Oxycodone HCl 10 MG TABS; Take 1 tablet (10 mg total) by mouth 4 (four) times daily for 30 days.  Chronic, continuous use of opioids -     Oxycodone HCl 10 MG TABS; Take 1 tablet (10  mg total) by mouth 4 (four) times daily for 30 days.  DDD (degenerative disc disease), lumbar  Myasthenia gravis ([place])  Lyme arthritis of multiple joints (HCC)  Lumbar spondylosis with myelopathy  Other orders -     Oxycodone HCl 10 MG TABS; Take 1 tablet (10 mg total) by mouth 4 (four) times daily for 30 days.        ----------------------------------------------------------------------------------------------------------------------  Problem List Items Addressed This Visit      Unprioritized   Myasthenia gravis ([place])    Other Visit Diagnoses    Cervicalgia    -  Primary   Facet arthritis of lumbosacral region       Relevant Medications   Oxycodone HCl 10 MG TABS (Start on 11/20/2018)   Oxycodone HCl 10 MG TABS (Start on 12/20/2018)   Chronic pain syndrome       Relevant Medications   Oxycodone HCl 10 MG TABS (Start on 12/20/2018)   Chronic, continuous use of opioids       Relevant Medications   Oxycodone HCl 10 MG TABS (Start on 12/20/2018)   DDD (degenerative disc disease), lumbar       Relevant Medications   Oxycodone HCl 10 MG TABS (Start on 11/20/2018)   Oxycodone HCl 10 MG TABS (Start on 12/20/2018)   Lyme arthritis of multiple joints (HCC)       Relevant Medications   Oxycodone HCl 10 MG TABS (Start on 11/20/2018)   Oxycodone HCl 10 MG TABS (Start on 12/20/2018)   Lumbar spondylosis with myelopathy             ----------------------------------------------------------------------------------------------------------------------  1. Cervicalgia Continue core stretching strengthening as previously reviewed  2. Facet arthritis of lumbosacral region As above and we will schedule him for a repeat facet block at the discretion of his cardiologist.  He is on blood thinners and has had a recent drug-eluting stent placed.  3. Chronic pain syndrome We will keep him on his current regimen which seems to be working well.  He shows no signs of any diversion or illicit use.  Refills will be given for February 16 and March 17 - Oxycodone HCl 10 MG TABS; Take 1 tablet (10 mg total) by mouth 4 (four) times daily for 30 days.  Dispense: 120 tablet; Refill: 0  4. Chronic, continuous use of opioids As above and we have reviewed the [facility] practitioner database information and it is appropriate. - Oxycodone HCl 10 MG TABS; Take 1 tablet (10 mg total) by mouth 4 (four) times daily for 30 days.  Dispense: 120 tablet; Refill: 0  5. DDD (degenerative disc disease), lumbar   6. Myasthenia gravis ([place])   7. Lyme arthritis of multiple joints ([place]) Continue follow-up with primary care physicians  8. Lumbar spondylosis with myelopathy     ----------------------------------------------------------------------------------------------------------------------  I have changed Kris Hartmann. Render "[name]"'s Oxycodone HCl. I am also having him start on Oxycodone HCl. Additionally, I am having him maintain his insulin lispro, loratadine, albuterol, gabapentin, metFORMIN, cyanocobalamin, albuterol, LANTUS SOLOSTAR, naloxone, rosuvastatin, Insulin Pen Needle, ezetimibe, aspirin EC, clopidogrel, sacubitril-valsartan, liraglutide, BREO ELLIPTA, dexlansoprazole, nitroGLYCERIN, cyclobenzaprine, carvedilol, furosemide, and levocetirizine.   Meds ordered this encounter  Medications  . Oxycodone HCl 10 MG TABS    Sig:  Take 1 tablet (10 mg total) by mouth 4 (four) times daily for 30 days.    Dispense:  120 tablet    Refill:  0    30 day supply..  No early refills  . Oxycodone HCl  10 MG TABS    Sig: Take 1 tablet (10 mg total) by mouth 4 (four) times daily for 30 days.    Dispense:  120 tablet    Refill:  0    30 day supply no early refills   Patient's Medications  New Prescriptions   OXYCODONE HCL 10 MG TABS    Take 1 tablet (10 mg total) by mouth 4 (four) times daily for 30 days.  Previous Medications   ALBUTEROL (PROVENTIL HFA;VENTOLIN HFA) 108 (90 BASE) MCG/ACT INHALER    Inhale 2 puffs into the lungs every 6 (six) hours as needed for wheezing or shortness of breath.   ALBUTEROL (PROVENTIL) (2.5 MG/3ML) 0.083% NEBULIZER SOLUTION    Take 2.5 mg by nebulization every 6 (six) hours as needed for wheezing or shortness of breath.   ASPIRIN EC 81 MG TABLET    Take 1 tablet (81 mg total) by mouth daily.   BREO ELLIPTA 200-25 MCG/INH AEPB    INHALE 1 PUFF INTO THE LUNGS DAILY   CARVEDILOL (COREG) 12.5 MG TABLET    Take 1 tablet (12.5 mg total) by mouth 2 (two) times daily.   CLOPIDOGREL (PLAVIX) 75 MG TABLET    Take 1 tablet (75 mg total) by mouth daily with breakfast.   CYANOCOBALAMIN 1000 MCG TABLET    Take 1,000 mcg by mouth daily.    CYCLOBENZAPRINE (FEXMID) 7.5 MG TABLET    Take 7.5 mg by mouth 3 (three) times daily as needed for muscle spasms.   DEXLANSOPRAZOLE (DEXILANT) 60 MG CAPSULE    Take 1 capsule (60 mg total) by mouth daily.   EZETIMIBE (ZETIA) 10 MG TABLET    Take 1 tablet (10 mg total) by mouth daily.   FUROSEMIDE (LASIX) 20 MG TABLET    Take 0.5 tablets (10 mg total) by mouth daily.   GABAPENTIN (NEURONTIN) 300 MG CAPSULE    Take 1 capsule (300 mg total) by mouth 3 (three) times daily.   INSULIN LISPRO (HUMALOG KWIKPEN) 100 UNIT/ML KIWKPEN    Inject 7-8 Units into the skin 3 (three) times daily. Reported on 12/02/2015/ sliding scale 1 unit for every 8 units of carbs; and 1 unit for every 20  above 120   INSULIN PEN NEEDLE (FIFTY50 PEN NEEDLES) 32G X 4 MM MISC    Inject 1 each into the skin as needed.   LANTUS SOLOSTAR 100 UNIT/ML SOLOSTAR PEN    Inject 25-30 Units into the skin daily at 10 pm.    LEVOCETIRIZINE (XYZAL) 5 MG TABLET    TAKE 1 TABLET(5 MG) BY MOUTH EVERY EVENING   LIRAGLUTIDE (VICTOZA) 18 MG/3ML SOPN    1.8 units every morning   LORATADINE (CLARITIN) 10 MG TABLET    Take 10 mg by mouth daily.    METFORMIN (GLUCOPHAGE) 1000 MG TABLET    Take 1,000 mg by mouth 2 (two) times daily with a meal.   NALOXONE (NARCAN) NASAL SPRAY 4 MG/0.1 ML    For excess sedation from opioids   NITROGLYCERIN (NITROSTAT) 0.3 MG SL TABLET    Place 1 tablet (0.3 mg total) under the tongue every 5 (five) minutes as needed for chest pain. Maximum 3 doses.   ROSUVASTATIN (CRESTOR) 5 MG TABLET    Take 5 mg by mouth daily.   SACUBITRIL-VALSARTAN (ENTRESTO) 24-26 MG    Take 1 tablet by mouth 2 (two) times daily.  Modified Medications   Modified Medication Previous Medication   OXYCODONE HCL 10 MG TABS Oxycodone HCl  10 MG TABS      Take 1 tablet (10 mg total) by mouth 4 (four) times daily for 30 days.    Take 1 tablet (10 mg total) by mouth 4 (four) times daily.  Discontinued Medications   No medications on file   ----------------------------------------------------------------------------------------------------------------------  Follow-up: Return in about 2 months (around 01/16/2019) for evaluation, med refill.    Molli Barrows, MD

## 2018-11-18 DIAGNOSIS — H9122 Sudden idiopathic hearing loss, left ear: Secondary | ICD-10-CM | POA: Diagnosis not present

## 2018-12-06 ENCOUNTER — Ambulatory Visit: Payer: Medicare Other | Admitting: Infectious Disease

## 2018-12-06 ENCOUNTER — Other Ambulatory Visit: Payer: Self-pay

## 2018-12-06 MED ORDER — SACUBITRIL-VALSARTAN 24-26 MG PO TABS: 1.0000 | ORAL_TABLET | Freq: Two times a day (BID) | ORAL | 1 refills | Status: DC

## 2018-12-07 DIAGNOSIS — H9042 Sensorineural hearing loss, unilateral, left ear, with unrestricted hearing on the contralateral side: Secondary | ICD-10-CM | POA: Diagnosis not present

## 2018-12-07 NOTE — Progress Notes (Signed)
Cardiology Office Note   Date:  12/09/2018   ID:  [name], DOB 1961/01/28, MRN [id number]  PCP:  [name], MD  Cardiologist: [name], MD   Chief Complaint  Patient presents with  . Other    3 month follow up. Patient c.o SOB, Chest pain, and swelling.  Meds reviewed verbally with patient.        History of Present Illness: [name] is a 58 y.o. male who presents for a follow-up visit regarding coronary artery disease and chronic systolic heart failure.  He has extensive cardiac history. He had a total of 10 stents placed (in LAD and RCA) starting in 2009 after he presented with myocardial infarction.  He has other chronic medical conditions that include diabetes, hypertension, hyperlipidemia,myasthenia gravis,  generalized pain due to reported Lyme disease and obesity.  Echocardiogram in January 2017 showed EF of 40 to 45% with severe anterior and apical hypokinesis. He has history of hyperlipidemia with intolerance to high-dose statins.  He had worsening heart failure in June of this year, 2019.  Echocardiogram showed worsening LV systolic function with an EF of 30% with akinesis of the anterior, anterolateral and apical myocardium.  There was mild mitral regurgitation. Cardiac catheterization in July showed patent stents in the LAD, OM's and right coronary artery.  There was a new mid left circumflex stenosis which was stented.  EF was 25 to 30%.  LVEDP was severely elevated.  The patient was started on furosemide.  Entresto was added. He had recurrent sepsis and fever of unknown origin starting in October 2019 with elevated troponin.  Repeat catheterization was not performed.  TEE was performed which showed no evidence of endocarditis.  EF was 40 to 45%.   The patient is doing well today. He has recently not had infections. 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 His right ear is fine. One of his providers did not believe it was sepsis, instead hypersensitivity shock to Imuran. He has congestion today. He says there is a little foot edema and is currently taking furosemide 20 mg. Has experienced some angina, but the nitroglycerin resolves it and he typically uses it 3 times a month. He does not experience any muscle aches from the rosuvastatin. He continues taking Plavix, but his pain doctor wants to do a procedure on him that would require him to stop Plavix a few days before it.   They deny SOB, palpitations or any other related symptoms or complaints at this time.    Past Medical History:  Diagnosis Date  . Allergy    dust, seasonal (worse in the fall).  . 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 Followed by Pain Specialist in [place], back and neck  . Asthma    BRONCHITIS  . Bacterial infection, unspecified    patient stated that it is unknown origin  . Cataract    First Dx in 2012  . Chronic combined systolic and diastolic congestive heart failure ([place])    a. 03/2018 Echo: EF 30-35%, ant, antlat, apical AK, Gr1 DD; b. 07/2018 Echo: EF 35-40%, anteroseptal, apical, and ant HK. Gr1 DD.  [name] Coronary artery disease    a. Prior Ant MI->s/p multiple stents placed in the LAD and right coronary artery ([place]); b. 2016 Cath: reportedly nonobs dzs;  c. 02/2017 MV: EF 45-54%, ap/ant, ap/inf, apical infarct, no ishcemia; c. 04/2018  Cath/PCI: LM nl, LAD 20p, patent mid stent, LCX 57m3.25x15 Sierra DES), OM1 nl, OM2 50, OM3 40 w/ patent stent, RCA 40p, 217m40d w/ patent stent in RPDA, RPAV 60, EF 25-35%. 2+MR.  . Diabetes mellitus without complication ([place]   TYPE 2  . Diabetic peripheral neuropathy (HCC)    feet and hands  . FUO (fever of unknown origin) 08/03/2018  . GERD (gastroesophageal reflux disease)   . Headache    muscle tension  . Hyperlipidemia   . Hypertension    CONTROLLED ON MEDS  . Insomnia   . Ischemic cardiomyopathy    a. 03/2018 Echo:  EF 30-35%, ant, antlat, apical AK, Gr1 DD, mild MR, mildly dil LA; b. 07/2018 Echo: EF 35-40%.  . Lyme disease    Chronic  . Myasthenia gravis ([facility]  . Myocardial infarction ([facility]  . Seasonal allergies   . Sleep apnea    CPAP    Past Surgical History:  Procedure Laterality Date  . BILATERAL CARPAL TUNNEL RELEASE Bilateral L in 2012 and R in 2013  . CARDIAC CATHETERIZATION     Several Caths, most recent in  March 2016.  . Marland KitchenOLONOSCOPY WITH PROPOFOL N/A 01/10/2016   Procedure: COLONOSCOPY WITH PROPOFOL;  Surgeon: DaLucilla LameMD;  Location: [facility];  Service: Endoscopy;  Laterality: N/A;  . CORONARY ANGIOPLASTY    . CORONARY STENT INTERVENTION N/A 04/25/2018   Procedure: CORONARY STENT INTERVENTION;  Surgeon: ArWellington HampshireMD;  [facility];  Service: Cardiovascular;  Laterality: N/A;  . ESOPHAGOGASTRODUODENOSCOPY (EGD) WITH PROPOFOL N/A 01/10/2016   Procedure: ESOPHAGOGASTRODUODENOSCOPY (EGD) WITH PROPOFOL;  Surgeon: DaLucilla LameMD;  Location: [facility];  Service: Endoscopy;  Laterality: N/A;  . EYE SURGERY Bilateral 2012   cataract/bilateral vitrectomies  . LEFT HEART CATH AND CORONARY ANGIOGRAPHY Left 04/25/2018   Procedure: LEFT HEART CATH AND CORONARY ANGIOGRAPHY;  Surgeon: ArWellington HampshireMD;  [facility];  Service: Cardiovascular;  Laterality: Left;  . TEE WITHOUT CARDIOVERSION N/A 09/05/2018   Procedure: TRANSESOPHAGEAL ECHOCARDIOGRAM (TEE);  Surgeon: ArWellington HampshireMD;  Location: [facility];  Service: Cardiovascular;  Laterality: N/A;  . TONSILLECTOMY AND ADENOIDECTOMY     As a child  . TUNNELED VENOUS CATHETER PLACEMENT     removed     Current Outpatient Medications  Medication Sig Dispense Refill  . albuterol (PROVENTIL HFA;VENTOLIN HFA) 108 (90 Base) MCG/ACT inhaler Inhale 2 puffs into the lungs every 6 (six) hours as needed for wheezing or shortness of breath.    . Marland Kitchenlbuterol (PROVENTIL) (2.5 MG/3ML) 0.083%  nebulizer solution Take 2.5 mg by nebulization every 6 (six) hours as needed for wheezing or shortness of breath.    . Marland Kitchenspirin EC 81 MG tablet Take 1 tablet (81 mg total) by mouth daily. 90 tablet 3  . BREO ELLIPTA 200-25 MCG/INH AEPB INHALE 1 PUFF INTO THE LUNGS DAILY (Patient taking differently: Inhale 1 puff into the lungs daily. ) 1 each 3  . carvedilol (COREG) 12.5 MG tablet Take 1 tablet (12.5 mg total) by mouth 2 (two) times daily. 180 tablet 1  . clopidogrel (PLAVIX) 75 MG tablet Take 1 tablet (75 mg total) by mouth daily with breakfast. 30 tablet 6  . cyanocobalamin 1000 MCG tablet Take 1,000 mcg by mouth daily.     . cyclobenzaprine (FEXMID) 7.5 MG tablet Take 7.5 mg by mouth 3 (three) times daily as needed for muscle spasms.    . Marland Kitchenexlansoprazole (DEXILANT) 60  MG capsule Take 1 capsule (60 mg total) by mouth daily. 30 capsule 6  . furosemide (LASIX) 20 MG tablet Take 0.5 tablets (10 mg total) by mouth daily. (Patient taking differently: Take 10 mg by mouth daily. Take 1/4 tablet daily) 90 tablet 3  . gabapentin (NEURONTIN) 300 MG capsule Take 1 capsule (300 mg total) by mouth 3 (three) times daily. 90 capsule 1  . gabapentin (NEURONTIN) 800 MG tablet Take 800 mg by mouth at bedtime.    . insulin lispro (HUMALOG KWIKPEN) 100 UNIT/ML KiwkPen Inject 7-8 Units into the skin 3 (three) times daily. Reported on 12/02/2015/ sliding scale 1 unit for every 8 units of carbs; and 1 unit for every 20 above 120    . Insulin Pen Needle (FIFTY50 PEN NEEDLES) 32G X 4 MM MISC Inject 1 each into the skin as needed.    [name] LANTUS SOLOSTAR 100 UNIT/ML Solostar Pen Inject 25-30 Units into the skin daily at 10 pm.   0  . levocetirizine (XYZAL) 5 MG tablet TAKE 1 TABLET(5 MG) BY MOUTH EVERY EVENING 30 tablet 3  . liraglutide (VICTOZA) 18 MG/3ML SOPN 1.8 units every morning (Patient taking differently: Inject 1.8 mg into the skin daily. )    . loratadine (CLARITIN) 10 MG tablet Take 10 mg by mouth daily.     .  metFORMIN (GLUCOPHAGE) 1000 MG tablet Take 1,000 mg by mouth 2 (two) times daily with a meal.    . naloxone (NARCAN) nasal spray 4 mg/0.1 mL For excess sedation from opioids 1 kit 2  . nitroGLYCERIN (NITROSTAT) 0.3 MG SL tablet Place 1 tablet (0.3 mg total) under the tongue every 5 (five) minutes as needed for chest pain. Maximum 3 doses. 25 tablet 3  . [START ON 12/20/2018] Oxycodone HCl 10 MG TABS Take 1 tablet (10 mg total) by mouth 4 (four) times daily for 30 days. 120 tablet 0  . rosuvastatin (CRESTOR) 5 MG tablet Take 5 mg by mouth daily.  0  . sacubitril-valsartan (ENTRESTO) 24-26 MG Take 1 tablet by mouth 2 (two) times daily. 60 tablet 1  . ezetimibe (ZETIA) 10 MG tablet Take 1 tablet (10 mg total) by mouth daily. 90 tablet 3   No current facility-administered medications for this visit.     Allergies:   Azathioprine; Metoprolol; and Novolog [insulin aspart]    Social History:  The patient  reports that he has never smoked. He has never used smokeless tobacco. He reports previous alcohol use. He reports that he does not use drugs.   Family History:  The patient's family history includes Cancer in his father; Diabetes in his brother and mother; Healthy in his brother and brother; Heart disease in his mother.    ROS:  Please see the history of present illness.   Otherwise, review of systems are positive for none.   All other systems are reviewed and negative.    PHYSICAL EXAM: VS:  BP 120/78 (BP Location: Left Arm, Patient Position: Sitting, Cuff Size: Normal)   Pulse 96   Ht '5\' 7"'$  (1.702 m)   Wt 237 lb 8 oz (107.7 kg)   BMI 37.20 kg/m  , BMI Body mass index is 37.2 kg/m. GEN: Well nourished, well developed, in no acute distress  HEENT: normal  Neck: no JVD, carotid bruits, or masses  Cardiac: RRR; no murmurs, rubs, or gallops, trace bilateral leg edema Respiratory:  clear to auscultation bilaterally, normal work of breathing GI: soft, nontender, nondistended, + BS MS: no  deformity or atrophy  Skin: warm and dry, no rash Neuro:  Strength and sensation are intact Psych: euthymic mood, full affect   EKG:  EKG is ordered today. The ekg ordered today demonstrates normal sinus rhythm with right bundle branch block, possible old inferior infarct and poor R wave progression in the precordial leads.   Recent Labs: 08/03/2018: ALT 17 08/10/2018: Hemoglobin 10.3; Platelets 332 10/19/2018: BUN 28; Creatinine, Ser 1.41; Potassium 4.0; Sodium 141    Lipid Panel    Component Value Date/Time   CHOL 109 05/18/2018   CHOL 113 01/17/2018 0819   TRIG 112 05/18/2018   HDL 37 05/18/2018   HDL 33 (L) 01/17/2018 0819   CHOLHDL 3.4 01/17/2018 0819   CHOLHDL 3.7 11/29/2017 1154   VLDL 26 11/29/2017 1154   LDLCALC 50 05/18/2018   LDLCALC 62 01/17/2018 0819      Wt Readings from Last 3 Encounters:  12/09/18 237 lb 8 oz (107.7 kg)  11/17/18 239 lb (108.4 kg)  09/29/18 239 lb 6.4 oz (108.6 kg)        ASSESSMENT AND PLAN:  1.  Coronary artery disease involving native coronary arteries with other forms of angina:    Status post recent PCI and drug-eluting stent placement to the mid left circumflex.   he has stable symptoms overall. I recommend continuing dual antiplatelet therapy indefinitely given multiple stents. However, if he needs to have back injection or procedure, Plavix can be held for 7 days starting July of this year.  2.  Chronic systolic heart failure with  reduced LV systolic function due to ischemic cardiomyopathy: Most recent ejection fraction improved to 40 to 45% on TEE.  Continue treatment with Entresto and carvedilol.  Check basic metabolic profile today given recent issues with kidney dysfunction.  3. Essential hypertension:   Blood pressure is controlled.  4. Hyperlipidemia: Continue treatment with rosuvastatin and Zetia.  Most recent lipid profile showed an LDL of 62 and triglyceride of 112.   Disposition:   FU with me in 4 months  I,  Jesus Reyes am acting as a Education administrator for [name], M.D.  I have reviewed the above documentation for accuracy and completeness, and I agree with the above.     Signed, [name], MD 12/09/18 [place], [place]

## 2018-12-09 ENCOUNTER — Ambulatory Visit: Payer: Medicare Other | Admitting: Cardiovascular Disease

## 2018-12-09 ENCOUNTER — Encounter: Payer: Self-pay | Admitting: Cardiovascular Disease

## 2018-12-09 VITALS — BP 120/78 | HR 96 | Ht 67.0 in | Wt 237.5 lb

## 2018-12-09 DIAGNOSIS — I25118 Atherosclerotic heart disease of native coronary artery with other forms of angina pectoris: Secondary | ICD-10-CM

## 2018-12-09 DIAGNOSIS — I5022 Chronic systolic (congestive) heart failure: Secondary | ICD-10-CM | POA: Diagnosis not present

## 2018-12-09 DIAGNOSIS — I10 Essential (primary) hypertension: Secondary | ICD-10-CM | POA: Diagnosis not present

## 2018-12-09 DIAGNOSIS — E785 Hyperlipidemia, unspecified: Secondary | ICD-10-CM | POA: Diagnosis not present

## 2018-12-09 NOTE — Progress Notes (Deleted)
Patient ID: [name], male    DOB: 08-Sep-1961, 58 y.o.   MRN: [id number]  HPI  [name] is a 58 y/o male with a history of asthma, CAD, DM, hyperlipidemia, HTN,GERD, lyme disease, myasthenia gravis, MI, obstructive sleep apnea, multiple stents and chronic heart failure.   Echo report from 09/05/18 reviewed and showed an EF of 40-45% and no evidence of vegetation or thrombus. Echo report from 03/31/18 reviewed and showed an EF of 30-35% along with mild Edgar.   Left heart catheterization done 04/25/18 which showed an EF of 25-35% along with severely elevated left ventricular end-diastolic pressure. DES placed in mid left circumflex.   Admitted 07/20/18 due to sepsis of unclear etiology along with altered mental status changes. Cardiology and ID consults obtained. Chest/ abdominal CT was negative. Discharged after 4 days. Admitted 07/07/18 due to fever, tachycardia and confusion. Cardiology consult done. IV fluids were given due to renal failure. IV antibiotics were given for colitis. Discharged after 6 days.   He presents today for a follow-up visit with a chief complaint of   Past Medical History:  Diagnosis Date  . Allergy    dust, seasonal (worse in the fall).  . 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 Followed by Pain Specialist in [place], back and neck  . Asthma    BRONCHITIS  . Bacterial infection, unspecified    patient stated that it is unknown origin  . Cataract    First Dx in 2012  . Chronic combined systolic and diastolic congestive heart failure ([place])    a. 03/2018 Echo: EF 30-35%, ant, antlat, apical AK, Gr1 DD; b. 07/2018 Echo: EF 35-40%, anteroseptal, apical, and ant HK. Gr1 DD.  [name] Coronary artery disease    a. Prior Ant MI->s/p multiple stents placed in the LAD and right coronary artery ([place]); b. 2016 Cath: reportedly nonobs dzs;  c. 02/2017 MV: EF 45-54%, ap/ant, ap/inf, apical infarct, no ishcemia; c. 04/2018 Cath/PCI: LM nl, LAD 20p, patent mid stent, LCX 63m(3.25x15 Sierra  DES), OM1 nl, OM2 50, OM3 40 w/ patent stent, RCA 40p, 83m, 40d w/ patent stent in RPDA, RPAV 60, EF 25-35%. 2+Edgar.  . Diabetes mellitus without complication ([place])    TYPE 2  . Diabetic peripheral neuropathy (HCC)    feet and hands  . FUO (fever of unknown origin) 08/03/2018  . GERD (gastroesophageal reflux disease)   . Headache    muscle tension  . Hyperlipidemia   . Hypertension    CONTROLLED ON MEDS  . Insomnia   . Ischemic cardiomyopathy    a. 03/2018 Echo: EF 30-35%, ant, antlat, apical AK, Gr1 DD, mild Edgar, mildly dil LA; b. 07/2018 Echo: EF 35-40%.  . Lyme disease    Chronic  . Myasthenia gravis ([place])   . Myocardial infarction ([place]) 2010  . Seasonal allergies   . Sleep apnea    CPAP   Past Surgical History:  Procedure Laterality Date  . BILATERAL CARPAL TUNNEL RELEASE Bilateral L in 2012 and R in 2013  . CARDIAC CATHETERIZATION     Several Caths, most recent in  March 2016.  [name] COLONOSCOPY WITH PROPOFOL N/A 01/10/2016   Procedure: COLONOSCOPY WITH PROPOFOL;  Surgeon: [name], MD;  Location: [facility];  Service: Endoscopy;  Laterality: N/A;  . CORONARY ANGIOPLASTY    . CORONARY STENT INTERVENTION N/A 04/25/2018   Procedure: CORONARY STENT INTERVENTION;  Surgeon: [name], MD;  Location: [place] [facility];  Service: Cardiovascular;  Laterality:  N/A;  . ESOPHAGOGASTRODUODENOSCOPY (EGD) WITH PROPOFOL N/A 01/10/2016   Procedure: ESOPHAGOGASTRODUODENOSCOPY (EGD) WITH PROPOFOL;  Surgeon: [name], MD;  Location: [facility];  Service: Endoscopy;  Laterality: N/A;  . EYE SURGERY Bilateral 2012   cataract/bilateral vitrectomies  . LEFT HEART CATH AND CORONARY ANGIOGRAPHY Left 04/25/2018   Procedure: LEFT HEART CATH AND CORONARY ANGIOGRAPHY;  Surgeon: [name], MD;  Location: [place] [facility];  Service: Cardiovascular;  Laterality: Left;  . TEE WITHOUT CARDIOVERSION N/A 09/05/2018   Procedure: TRANSESOPHAGEAL ECHOCARDIOGRAM (TEE);  Surgeon: [name], MD;  Location: [facility];  Service: Cardiovascular;  Laterality: N/A;  . TONSILLECTOMY AND ADENOIDECTOMY     As a child  . TUNNELED VENOUS CATHETER PLACEMENT     removed   Family History  Problem Relation Age of Onset  . Diabetes Mother   . Heart disease Mother   . Cancer Father        Prostate CA  . Diabetes Brother   . Healthy Brother   . Healthy Brother    Social History   Tobacco Use  . Smoking status: Never Smoker  . Smokeless tobacco: Never Used  . Tobacco comment: smoking cessation materials not required  Substance Use Topics  . Alcohol use: Not Currently    Alcohol/week: 0.0 standard drinks   Allergies  Allergen Reactions  . Azathioprine Other (See Comments)    Azathioprine hypersensitivity reaction  . Metoprolol Rash  . Novolog [Insulin Aspart] Hives     Review of Systems  Constitutional: Positive for fatigue (easily). Negative for appetite change.  HENT: Negative for congestion, postnasal drip and sore throat.   Eyes: Negative.   Respiratory: Positive for shortness of breath (with moderate exertion) and wheezing (last night). Negative for cough.   Cardiovascular: Negative for chest pain, palpitations and leg swelling.  Gastrointestinal: Negative for abdominal distention and abdominal pain.  Endocrine: Negative.   Genitourinary: Negative.   Musculoskeletal: Positive for back pain (lower left part). Negative for neck pain.  Skin: Negative.   Allergic/Immunologic: Negative.   Neurological: Positive for dizziness (2 days ago) and numbness (neuropathy in feet). Negative for light-headedness.  Hematological: Negative for adenopathy. Does not bruise/bleed easily.  Psychiatric/Behavioral: Negative for dysphoric mood and sleep disturbance (sleeping on 2 pillows). The patient is not nervous/anxious.      Physical Exam Vitals signs and nursing note reviewed.  Constitutional:      Appearance: He is well-developed.  HENT:     Head: Normocephalic and  atraumatic.  Neck:     Musculoskeletal: Normal range of motion and neck supple.     Vascular: No JVD.  Cardiovascular:     Rate and Rhythm: Regular rhythm. Tachycardia present.  Pulmonary:     Effort: Pulmonary effort is normal. No respiratory distress.     Breath sounds: Examination of the right-upper field reveals wheezing. Examination of the right-lower field reveals wheezing. Examination of the left-lower field reveals wheezing. Wheezing present.  Abdominal:     General: There is no distension.     Palpations: Abdomen is soft.  Musculoskeletal:        General: No tenderness.  Skin:    General: Skin is warm and dry.  Neurological:     Mental Status: He is alert and oriented to person, place, and time.  Psychiatric:        Behavior: Behavior normal.        Thought Content: Thought content normal.     Assessment & Plan:  1:  Chronic heart failure with reduced ejection fraction- - NYHA class III - euvolemic today - weighing daily and says that his weight has been stable. Reminded to call for an overnight weight gain of >2 pounds or a weekly weight gain of >5 pounds - weight  - did advise him that he could take his diuretic as needed based on weight gain or edema - not adding salt to his food and has been reading food labels for sodium content. Reviewed the importance of keeping daily sodium intake to 2000mg   -  - BP today limits titration of entresto - saw cardiology Fletcher Anon) 05/12/18 - saw pulmonology Raul Del) 05/20/18 - participating in cardiac rehab  - reports receiving his flu vaccine for this season  2: HTN- - BP  - saw PCP Ancil Boozer) 06/10/18 - BMP 10/19/2018 reviewed and showed sodium 141, potassium 4.0, creatinine 1.41 and GFR 54  3: DM- - fasting glucose at home today was  - saw endocrinology Honor Junes) 06/01/18 - A1c 06/01/18 was 7.9%  Medication list was reviewed.

## 2018-12-09 NOTE — Patient Instructions (Signed)
Medication Instructions:  No changes If you need a refill on your cardiac medications before your next appointment, please call your pharmacy.   Lab work: Your provider would like for you to have the following labs today: BMET  If you have labs (blood work) drawn today and your tests are completely normal, you will receive your results only by: [name] MyChart Message (if you have MyChart) OR . A paper copy in the mail If you have any lab test that is abnormal or we need to change your treatment, we will call you to review the results.  Testing/Procedures: None ordered  Follow-Up: At [facility], you and your health needs are our priority.  As part of our continuing mission to provide you with exceptional heart care, we have created designated Provider Care Teams.  These Care Teams include your primary Cardiologist (physician) and Advanced Practice Providers (APPs -  Physician Assistants and Nurse Practitioners) who all work together to provide you with the care you need, when you need it. You will need a follow up appointment in 4 months.  Please call our office 2 months in advance to schedule this appointment.  You may see [name], MD or one of the following Advanced Practice Providers on your designated Care Team:   Murray Hodgkins, NP Christell Faith, PA-C . Marrianne Mood, PA-C

## 2018-12-10 LAB — BASIC METABOLIC PANEL
BUN/Creatinine Ratio: 19 (ref 9–20)
BUN: 24 mg/dL (ref 6–24)
CO2: 21 mmol/L (ref 20–29)
CREATININE: 1.28 mg/dL — AB (ref 0.76–1.27)
Calcium: 10 mg/dL (ref 8.7–10.2)
Chloride: 104 mmol/L (ref 96–106)
GFR calc Af Amer: 70 mL/min/{1.73_m2} (ref >59)
GFR, EST NON AFRICAN AMERICAN: 61 mL/min/{1.73_m2} (ref >59)
Glucose: 148 mg/dL — ABNORMAL HIGH (ref 65–99)
Potassium: 5.5 mmol/L — ABNORMAL HIGH (ref 3.5–5.2)
Sodium: 143 mmol/L (ref 134–144)

## 2018-12-12 ENCOUNTER — Other Ambulatory Visit: Payer: Self-pay

## 2018-12-12 ENCOUNTER — Ambulatory Visit (INDEPENDENT_AMBULATORY_CARE_PROVIDER_SITE_OTHER): Payer: Medicare Other | Admitting: Family Medicine

## 2018-12-12 ENCOUNTER — Encounter: Payer: Self-pay | Admitting: Family Medicine

## 2018-12-12 VITALS — BP 90/60 | HR 103 | Temp 97.9°F | Resp 16 | Ht 67.0 in | Wt 237.7 lb

## 2018-12-12 DIAGNOSIS — D649 Anemia, unspecified: Secondary | ICD-10-CM

## 2018-12-12 DIAGNOSIS — Z794 Long term (current) use of insulin: Secondary | ICD-10-CM

## 2018-12-12 DIAGNOSIS — E876 Hypokalemia: Secondary | ICD-10-CM | POA: Diagnosis not present

## 2018-12-12 DIAGNOSIS — G7000 Myasthenia gravis without (acute) exacerbation: Secondary | ICD-10-CM | POA: Diagnosis not present

## 2018-12-12 DIAGNOSIS — I10 Essential (primary) hypertension: Secondary | ICD-10-CM

## 2018-12-12 DIAGNOSIS — I5022 Chronic systolic (congestive) heart failure: Secondary | ICD-10-CM | POA: Diagnosis not present

## 2018-12-12 DIAGNOSIS — E1142 Type 2 diabetes mellitus with diabetic polyneuropathy: Secondary | ICD-10-CM

## 2018-12-12 DIAGNOSIS — J4541 Moderate persistent asthma with (acute) exacerbation: Secondary | ICD-10-CM

## 2018-12-12 LAB — CBC WITH DIFFERENTIAL/PLATELET
Absolute Monocytes: 360 cells/uL (ref 200–950)
Basophils Absolute: 38 cells/uL (ref 0–200)
Basophils Relative: 0.8 %
Eosinophils Absolute: 278 cells/uL (ref 15–500)
Eosinophils Relative: 5.8 %
HCT: 34.9 % — ABNORMAL LOW (ref 38.5–50.0)
Hemoglobin: 11.2 g/dL — ABNORMAL LOW (ref 13.2–17.1)
Lymphs Abs: 984 cells/uL (ref 850–3900)
MCH: 27.1 pg (ref 27.0–33.0)
MCHC: 32.1 g/dL (ref 32.0–36.0)
MCV: 84.5 fL (ref 80.0–100.0)
MPV: 10.2 fL (ref 7.5–12.5)
Monocytes Relative: 7.5 %
NEUTROS PCT: 65.4 %
Neutro Abs: 3139 cells/uL (ref 1500–7800)
Platelets: 191 10*3/uL (ref 140–400)
RBC: 4.13 10*6/uL — ABNORMAL LOW (ref 4.20–5.80)
RDW: 13.8 % (ref 11.0–15.0)
Total Lymphocyte: 20.5 %
WBC: 4.8 10*3/uL (ref 3.8–10.8)

## 2018-12-12 LAB — POTASSIUM: Potassium: 5 mmol/L (ref 3.5–5.3)

## 2018-12-12 MED ORDER — PREDNISONE 20 MG PO TABS: 20.0000 mg | ORAL_TABLET | Freq: Two times a day (BID) | ORAL | 0 refills | Status: DC

## 2018-12-12 MED ORDER — BENZONATATE 100 MG PO CAPS: 100.0000 mg | ORAL_CAPSULE | Freq: Two times a day (BID) | ORAL | 0 refills | Status: DC | PRN

## 2018-12-12 NOTE — Progress Notes (Signed)
Name: [name]   MRN: [id number]    DOB: 12-31-1960   Date:12/12/2018       Progress Note  Subjective  Chief Complaint  Chief Complaint  Patient presents with  . Hypertension  . Diabetes  . Hyperlipidemia  . Wheezing    Wheezing bad at night in his sleep  . Congested  . Cough    Dry cough    HPI  CAD with angina/CHF/HTN:multiple stents in the past, he had a new stent placed July 2019, also diagnosed with CHF and is now on Entresto, Lasix prn only , beta blocker and back on plavix and aspirin. Tolerating medication well. He has  dizziness when getting up. He has been on disability because of heart disease . His last visit was Friday with Dr. Fletcher Anon, potassium was high and we will recheck it today   Possible reaction to Imuran: fever resolved, CRP and sed rate back to normal,  still seeing Dr. Tommy Medal, still unable to hear from the left side- ENT was giving him steroid injections but now released and may need hearing aid.  Hyperbaric treatment did not help and it was stopped. Initially thought to have been sepsis however neurologist at [facility] thinks secondary to reaction to Imuran   MG: seeing neurologist- Dr. Melrose Nakayama in [place] and also seen by Dr. Warnell Forester at [facility]  He has ptosis of left eyethroughout the day, but both sides at the end of the day. He has fatigue after activity. Stable on medications.  He still uses a cane. Taking mediations as prescribed    DMII : he has neuropathy, already on gabapentin given by Dr. Melrose Nakayama. HgbA1C went up to 7.9% done at Dr. Lenon Ahmadi August 2019, and last checked 11/2018 at 7.8%  Denies polyphagia, polydipsia or polyuria. Taking medication as prescribed . He is on Lantus, metformin and Victoza. FSBS at home has been 100's fasting in the 140's later in the day  Asthma: he sees pulmonologist Dr. Raul Del. He developed cold symptoms a few days ago. He noticed some nasal congestion and rhinorrhea, however worsening of cough, wheezing and has a  clear to white thick sputum, no fever or chills. Appetite is normal. He is using Breo daily also using rescue inhaler three times a day for the past few days.   Morbid obesity: based on co-morbidities. He is trying to lose weight. He lost a couple of pounds since last visit   Inflammatory spondylopathy: continue follow up with pain clinic, pain is stable, has problems with gait, balance problems at times. He will start steroid injections in July and Dr. Fletcher Anon states he can skip Plavix for one week prior to procedures  Patient Active Problem List   Diagnosis Date Noted  . Bacteremia   . FUO (fever of unknown origin) 08/03/2018  . Sepsis ([place]) 07/08/2018  . Colitis 07/08/2018  . OSA on CPAP 07/08/2018  . Unstable angina ([place]) 04/26/2018  . Ischemic cardiomyopathy 04/26/2018  . Chronic combined systolic and diastolic CHF (congestive heart failure) ([place]) 04/26/2018  . Effort angina ([place]) 04/25/2018  . 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[place]) 01/25/2018  . Hyperlipidemia due to type 2 diabetes mellitus ([place]) 08/12/2017  . Vitamin D deficiency, unspecified 08/12/2017  . Ptosis of left eyelid 01/08/2017  . Dermatitis 12/24/2016  . Difficulty walking 04/27/2016  . Foot cramps 04/27/2016  . Morbid (severe) obesity due to excess calories ([place]) 04/06/2016  . Benign neoplasm of sigmoid colon   . Benign neoplasm of descending colon   .  Benign neoplasm of transverse colon   . Coronary artery disease involving native coronary artery with angina pectoris ([place]) 10/22/2015  . Coronary artery disease 08/22/2015  . DM (diabetes mellitus), type 2, uncontrolled with complications ([place]) 79/89/2119  . Myasthenia gravis ([place]) 08/22/2015  . Chronic left-sided low back pain 08/22/2015  . Hypertension 08/22/2015  . GERD (gastroesophageal reflux disease) 08/22/2015  . Arthritis 08/22/2015  . Diabetic peripheral neuropathy ([place]) 08/22/2015  . Hyperlipidemia 08/22/2015  . Insomnia 08/22/2015   . Type 2 diabetes mellitus with diabetic polyneuropathy, with long-term current use of insulin ([place]) 08/22/2015    Past Surgical History:  Procedure Laterality Date  . BILATERAL CARPAL TUNNEL RELEASE Bilateral L in 2012 and R in 2013  . CARDIAC CATHETERIZATION     Several Caths, most recent in  March 2016.  [name] COLONOSCOPY WITH PROPOFOL N/A 01/10/2016   Procedure: COLONOSCOPY WITH PROPOFOL;  Surgeon: [name], MD;  Location: [facility] END[facility]COPY;  Service: Endoscopy;  Laterality: N/A;  . CORONARY ANGIOPLASTY    . CORONARY STENT INTERVENTION N/A 04/25/2018   Procedure: CORONARY STENT INTERVENTION;  Surgeon: [name], MD;  Location: [place] [facility];  Service: Cardiovascular;  Laterality: N/A;  . ESOPHAGOGASTRODUODENOSCOPY (EGD) WITH PROPOFOL N/A 01/10/2016   Procedure: ESOPHAGOGASTRODUODENOSCOPY (EGD) WITH PROPOFOL;  Surgeon: [name], MD;  Location: [facility] ENDOSCOPY;  Service: Endoscopy;  Laterality: N/A;  . EYE SURGERY Bilateral 2012   cataract/bilateral vitrectomies  . LEFT HEART CATH AND CORONARY ANGIOGRAPHY Left 04/25/2018   Procedure: LEFT HEART CATH AND CORONARY ANGIOGRAPHY;  Surgeon: [name], MD;  Location: [place] [facility];  Service: Cardiovascular;  Laterality: Left;  . TEE WITHOUT CARDIOVERSION N/A 09/05/2018   Procedure: TRANSESOPHAGEAL ECHOCARDIOGRAM (TEE);  Surgeon: [name], MD;  Location: [facility] OR[facility];  Service: Cardiovascular;  Laterality: N/A;  . TONSILLECTOMY AND ADENOIDECTOMY     As a child  . TUNNELED VENOUS CATHETER PLACEMENT     removed    Family History  Problem Relation Age of Onset  . Diabetes Mother   . Heart disease Mother   . Cancer Father        Prostate CA  . Diabetes Brother   . Healthy Brother   . Healthy Brother     Social History   Socioeconomic History  . Marital status: Married    Spouse name: Neoma Laming  . Number of children: 0  . Years of education: Not on file  . Highest education level: Master's degree (e.g., MA,  MS, MEng, MEd, MSW, MBA)  Occupational History  . Occupation: disabled    Comment: multiple medical problems - first approved for uncontrolled DM, but now has heart disease and Myasthenia Gravis   Social Needs  . Financial resource strain: Not hard at all  . Food insecurity:    Worry: Never true    Inability: Never true  . Transportation needs:    Medical: No    Non-medical: No  Tobacco Use  . Smoking status: Never Smoker  . Smokeless tobacco: Never Used  . Tobacco comment: smoking cessation materials not required  Substance and Sexual Activity  . Alcohol use: Not Currently    Alcohol/week: 0.0 standard drinks  . Drug use: No  . Sexual activity: Not Currently    Partners: Female  Lifestyle  . Physical activity:    Days per week: 7 days    Minutes per session: 30 min  . Stress: Not at all  Relationships  . Social connections:    Talks  on phone: More than three times a week    Gets together: Three times a week    Attends religious service: More than 4 times per year    Active member of club or organization: Yes    Attends meetings of clubs or organizations: More than 4 times per year    Relationship status: Married  . Intimate partner violence:    Fear of current or ex partner: No    Emotionally abused: No    Physically abused: No    Forced sexual activity: No  Other Topics Concern  . Not on file  Social History Narrative  . Not on file     Current Outpatient Medications:  .  albuterol (PROVENTIL HFA;VENTOLIN HFA) 108 (90 Base) MCG/ACT inhaler, Inhale 2 puffs into the lungs every 6 (six) hours as needed for wheezing or shortness of breath., Disp: , Rfl:  .  albuterol (PROVENTIL) (2.5 MG/3ML) 0.083% nebulizer solution, Take 2.5 mg by nebulization every 6 (six) hours as needed for wheezing or shortness of breath., Disp: , Rfl:  .  aspirin EC 81 MG tablet, Take 1 tablet (81 mg total) by mouth daily., Disp: 90 tablet, Rfl: 3 .  BREO ELLIPTA 200-25 MCG/INH AEPB, INHALE 1  PUFF INTO THE LUNGS DAILY (Patient taking differently: Inhale 1 puff into the lungs daily. ), Disp: 1 each, Rfl: 3 .  carvedilol (COREG) 12.5 MG tablet, Take 1 tablet (12.5 mg total) by mouth 2 (two) times daily., Disp: 180 tablet, Rfl: 1 .  clopidogrel (PLAVIX) 75 MG tablet, Take 1 tablet (75 mg total) by mouth daily with breakfast., Disp: 30 tablet, Rfl: 6 .  cyanocobalamin 1000 MCG tablet, Take 1,000 mcg by mouth daily. , Disp: , Rfl:  .  cyclobenzaprine (FEXMID) 7.5 MG tablet, Take 7.5 mg by mouth 3 (three) times daily as needed for muscle spasms., Disp: , Rfl:  .  dexlansoprazole (DEXILANT) 60 MG capsule, Take 1 capsule (60 mg total) by mouth daily., Disp: 30 capsule, Rfl: 6 .  furosemide (LASIX) 20 MG tablet, Take 0.5 tablets (10 mg total) by mouth daily. (Patient taking differently: Take 10 mg by mouth daily. Take 1/4 tablet daily), Disp: 90 tablet, Rfl: 3 .  gabapentin (NEURONTIN) 300 MG capsule, Take 1 capsule (300 mg total) by mouth 3 (three) times daily., Disp: 90 capsule, Rfl: 1 .  gabapentin (NEURONTIN) 800 MG tablet, Take 800 mg by mouth at bedtime., Disp: , Rfl:  .  insulin lispro (HUMALOG KWIKPEN) 100 UNIT/ML KiwkPen, Inject 7-8 Units into the skin 3 (three) times daily. Reported on 12/02/2015/ sliding scale 1 unit for every 8 units of carbs; and 1 unit for every 20 above 120, Disp: , Rfl:  .  Insulin Pen Needle (FIFTY50 PEN NEEDLES) 32G X 4 MM MISC, Inject 1 each into the skin as needed., Disp: , Rfl:  .  LANTUS SOLOSTAR 100 UNIT/ML Solostar Pen, Inject 25-30 Units into the skin daily at 10 pm. , Disp: , Rfl: 0 .  levocetirizine (XYZAL) 5 MG tablet, TAKE 1 TABLET(5 MG) BY MOUTH EVERY EVENING, Disp: 30 tablet, Rfl: 3 .  liraglutide (VICTOZA) 18 MG/3ML SOPN, 1.8 units every morning (Patient taking differently: Inject 1.8 mg into the skin daily. ), Disp: , Rfl:  .  loratadine (CLARITIN) 10 MG tablet, Take 10 mg by mouth daily. , Disp: , Rfl:  .  metFORMIN (GLUCOPHAGE) 1000 MG tablet, Take  1,000 mg by mouth 2 (two) times daily with a meal., Disp: , Rfl:  .  naloxone (NARCAN) nasal spray 4 mg/0.1 mL, For excess sedation from opioids, Disp: 1 kit, Rfl: 2 .  nitroGLYCERIN (NITROSTAT) 0.3 MG SL tablet, Place 1 tablet (0.3 mg total) under the tongue every 5 (five) minutes as needed for chest pain. Maximum 3 doses., Disp: 25 tablet, Rfl: 3 .  [START ON 12/20/2018] Oxycodone HCl 10 MG TABS, Take 1 tablet (10 mg total) by mouth 4 (four) times daily for 30 days., Disp: 120 tablet, Rfl: 0 .  rosuvastatin (CRESTOR) 5 MG tablet, Take 5 mg by mouth daily., Disp: , Rfl: 0 .  sacubitril-valsartan (ENTRESTO) 24-26 MG, Take 1 tablet by mouth 2 (two) times daily., Disp: 60 tablet, Rfl: 1 .  ezetimibe (ZETIA) 10 MG tablet, Take 1 tablet (10 mg total) by mouth daily., Disp: 90 tablet, Rfl: 3  Allergies  Allergen Reactions  . Azathioprine Other (See Comments)    Azathioprine hypersensitivity reaction  . Metoprolol Rash  . Novolog [Insulin Aspart] Hives    I personally reviewed active problem list, medication list, allergies, family history, social history with the patient/caregiver today.   ROS  Constitutional: Negative for fever or weight change.  Respiratory: Negative for cough and shortness of breath.   Cardiovascular: Negative for chest pain or palpitations.  Gastrointestinal: Negative for abdominal pain, no bowel changes.  Musculoskeletal: Positive  for gait problem but no  joint swelling.  Skin: Negative for rash.  Neurological: positive  for dizziness ( orthostatic ) but no  headache.  No other specific complaints in a complete review of systems (except as listed in HPI above).  Objective  Vitals:   12/12/18 0849  BP: 90/60  Pulse: (!) 103  Resp: 16  Temp: 97.9 F (36.6 C)  TempSrc: Oral  SpO2: 98%  Weight: 237 lb 11.2 oz (107.8 kg)  Height: '5\' 7"'$  (1.702 m)    Body mass index is 37.23 kg/m.  Physical Exam  Constitutional: Patient appears well-developed and  well-nourished. Obese  No distress.  HEENT: head atraumatic, normocephalic, pupils equal and reactive to light, ears normal TM  neck supple, throat within normal limits Cardiovascular: Normal rate, regular rhythm and normal heart sounds.  No murmur heard. No BLE edema. Pulmonary/Chest: [place] normal , he has in and expiratory wheezing. No respiratory distress. Abdominal: Soft.  There is no tenderness. Muscular Skeletal: atrophy of left tenar area  Psychiatric: Patient has a normal mood and affect. behavior is normal. Judgment and thought content normal.  Recent Results (from the past 2160 hour(s))  Glucose, capillary     Status: Abnormal   Collection Time: 09/21/18 12:53 PM  Result Value Ref Range   Glucose-Capillary 159 (H) 70 - 99 mg/dL  Glucose, capillary     Status: Abnormal   Collection Time: 09/21/18  3:22 PM  Result Value Ref Range   Glucose-Capillary 164 (H) 70 - 99 mg/dL  Glucose, capillary     Status: Abnormal   Collection Time: 09/22/18 12:53 PM  Result Value Ref Range   Glucose-Capillary 161 (H) 70 - 99 mg/dL  Glucose, capillary     Status: None   Collection Time: 09/22/18  3:08 PM  Result Value Ref Range   Glucose-Capillary 89 70 - 99 mg/dL  Glucose, capillary     Status: Abnormal   Collection Time: 09/23/18 12:51 PM  Result Value Ref Range   Glucose-Capillary 105 (H) 70 - 99 mg/dL  Glucose, capillary     Status: Abnormal   Collection Time: 09/23/18  1:32 PM  Result Value Ref  Range   Glucose-Capillary 130 (H) 70 - 99 mg/dL  Glucose, capillary     Status: Abnormal   Collection Time: 09/23/18  3:31 PM  Result Value Ref Range   Glucose-Capillary 146 (H) 70 - 99 mg/dL  Glucose, capillary     Status: Abnormal   Collection Time: 09/26/18 12:50 PM  Result Value Ref Range   Glucose-Capillary 188 (H) 70 - 99 mg/dL  Glucose, capillary     Status: Abnormal   Collection Time: 09/26/18  3:02 PM  Result Value Ref Range   Glucose-Capillary 150 (H) 70 - 99 mg/dL  Glucose,  capillary     Status: Abnormal   Collection Time: 09/27/18  7:57 AM  Result Value Ref Range   Glucose-Capillary 252 (H) 70 - 99 mg/dL  Glucose, capillary     Status: Abnormal   Collection Time: 09/27/18 10:06 AM  Result Value Ref Range   Glucose-Capillary 196 (H) 70 - 99 mg/dL  Glucose, capillary     Status: Abnormal   Collection Time: 10/03/18  1:02 PM  Result Value Ref Range   Glucose-Capillary 281 (H) 70 - 99 mg/dL  Glucose, capillary     Status: Abnormal   Collection Time: 10/03/18  3:09 PM  Result Value Ref Range   Glucose-Capillary 210 (H) 70 - 99 mg/dL  Basic metabolic panel     Status: Abnormal   Collection Time: 10/19/18  9:30 AM  Result Value Ref Range   Sodium 141 135 - 145 mmol/L   Potassium 4.0 3.5 - 5.1 mmol/L   Chloride 104 98 - 111 mmol/L   CO2 30 22 - 32 mmol/L   Glucose, Bld 138 (H) 70 - 99 mg/dL   BUN 28 (H) 6 - 20 mg/dL   Creatinine, Ser 1.41 (H) 0.61 - 1.24 mg/dL   Calcium 8.8 (L) 8.9 - 10.3 mg/dL   GFR calc non Af Amer 54 (L) >60 mL/min   GFR calc Af Amer >60 >60 mL/min   Anion gap 7 5 - 15    Comment: Performed at [facility], [address] [place], [place] [address]  Basic metabolic panel     Status: Abnormal   Collection Time: 12/09/18  1:58 PM  Result Value Ref Range   Glucose 148 (H) 65 - 99 mg/dL   BUN 24 6 - 24 mg/dL   Creatinine, Ser 1.28 (H) 0.76 - 1.27 mg/dL   GFR calc non Af Amer 61 >59 mL/min/1.73   GFR calc Af Amer 70 >59 mL/min/1.73   BUN/Creatinine Ratio 19 9 - 20   Sodium 143 134 - 144 mmol/L   Potassium 5.5 (H) 3.5 - 5.2 mmol/L   Chloride 104 96 - 106 mmol/L   CO2 21 20 - 29 mmol/L   Calcium 10.0 8.7 - 10.2 mg/dL      PHQ2/9: Depression screen [facility] 2/9 12/12/2018 11/17/2018 09/29/2018 09/06/2018 08/12/2018  Decreased Interest 0 0 1 0 0  Down, Depressed, Hopeless 0 0 1 0 0  PHQ - 2 Score 0 0 2 0 0  Altered sleeping 1 - 0 - 0  Tired, decreased energy 1 - 3 - 1  Change in appetite 1 - 2 - 1  Feeling bad or failure  about yourself  0 - 1 - 0  Trouble concentrating 0 - 0 - 0  Moving slowly or fidgety/restless 0 - 0 - 0  Suicidal thoughts 0 - 0 - 0  PHQ-9 Score 3 - 8 - 2  Difficult doing  work/chores Not difficult at all - Somewhat difficult - Somewhat difficult  Some recent data might be hidden   From MG - not positive   Fall Risk: Fall Risk  12/12/2018 11/17/2018 09/29/2018 09/06/2018 08/12/2018  Falls in the past year? 0 0 0 0 1  Comment - - - - -  Number falls in past yr: 0 - 0 0 1  Comment - - - - -  Injury with Fall? 0 - 0 0 1  Risk Factor Category  - - - - -  Risk for fall due to : - - - - History of fall(s);Impaired balance/gait  Risk for fall due to: Comment - - - - -  Follow up - - - - -      Assessment & Plan  1. Anemia, unspecified type  - CBC with Differential/Platelet  2. Hypokalemia  - Potassium  3. Chronic systolic CHF (congestive heart failure) ([place])  Under the care of cardiologist   4. Myasthenia gravis ([place])  Stable, seen neurologist   5. Morbid obesity ([place])  Trying to lose weight, lost 2 lbs since last visit, BMI above 35 with co-morbidities   6. Type 2 diabetes mellitus with diabetic polyneuropathy, with long-term current use of insulin ([facility])  Seeing Dr. Honor Junes, and fasting glucose at home has been at goal Eye exam is up to date  7. Moderate persistent asthma with acute exacerbation  - benzonatate (TESSALON) 100 MG capsule; Take 1-2 capsules (100-200 mg total) by mouth 2 (two) times daily as needed.  Dispense: 40 capsule; Refill: 0 Prednisone sent to pharmacy   8. Essential hypertension  bp is low, getting bp medication adjusted by cardiologist, since he needs medication for CHF

## 2018-12-13 ENCOUNTER — Ambulatory Visit: Payer: Medicare Other | Admitting: Family

## 2018-12-13 DIAGNOSIS — E1165 Type 2 diabetes mellitus with hyperglycemia: Secondary | ICD-10-CM | POA: Diagnosis not present

## 2018-12-13 DIAGNOSIS — Z794 Long term (current) use of insulin: Secondary | ICD-10-CM | POA: Diagnosis not present

## 2018-12-14 ENCOUNTER — Telehealth: Payer: Self-pay

## 2018-12-14 MED ORDER — CLOPIDOGREL BISULFATE 75 MG PO TABS: 75.0000 mg | ORAL_TABLET | Freq: Every day | ORAL | 6 refills | Status: DC

## 2018-12-14 NOTE — Telephone Encounter (Signed)
RX for Plavix 75mg  sent to [facility].

## 2018-12-16 ENCOUNTER — Telehealth: Payer: Self-pay | Admitting: *Deleted

## 2018-12-16 DIAGNOSIS — E875 Hyperkalemia: Secondary | ICD-10-CM

## 2018-12-16 DIAGNOSIS — I10 Essential (primary) hypertension: Secondary | ICD-10-CM

## 2018-12-16 NOTE — Telephone Encounter (Signed)
Left a message for the patient to call back.  

## 2018-12-16 NOTE — Telephone Encounter (Signed)
Patient made aware of results and verbalized understanding. BMET order for [facility] has been placed.   The patient has been advised to decrease his amount of potassium in his diet.

## 2018-12-16 NOTE — Telephone Encounter (Signed)
Pt is returning your call

## 2018-12-16 NOTE — Telephone Encounter (Signed)
-----   Message from Edgar Hampshire, MD sent at 12/15/2018  2:20 PM EDT ----- Inform patient that labs showed improvement in renal function but his potassium is mildly elevated.  Make sure he is not taking any potassium supplements and he should follow a low potassium diet.  Recheck potassium in 2 weeks.

## 2018-12-23 ENCOUNTER — Telehealth: Payer: Self-pay

## 2018-12-23 NOTE — Telephone Encounter (Signed)
LVM for pt to call office in regards to scheduling his telemed call with Dr. Allen Norris for 12/27/18.  Thanks [employer]

## 2018-12-26 NOTE — Progress Notes (Addendum)
Primary Care Physician: [name], MD  Primary Gastroenterologist:  Dr. [name]  GM:[facility]U  Virtual Visit via Telephone Note  I connected with [name] on 12/27/18 at 10:00 AM EDT by telephone and verified that I am speaking with the correct person using two identifiers.   I discussed the limitations, risks, security and privacy concerns of performing an evaluation and management service by telephone and the availability of in person appointments. I also discussed with the patient that there may be a patient responsible charge related to this service. The patient expressed understanding and agreed to proceed.   I discussed the assessment and treatment plan with the patient. The patient was provided an opportunity to ask questions and all were answered. The patient agreed with the plan and demonstrated an understanding of the instructions.   The patient was advised to call back or seek an in-person evaluation if the symptoms worsen or if the condition fails to improve as anticipated.  Location of the patient: Home Location of provider: Home Participating persons: Myself and Ginger Feldpausch   I provided 13 minutes of non-face-to-face time during this encounter.   [name], MD   HPI: [name] is a 58 y.o. male now being called for a following up with a history of Heartburn.  The patient had an EGD and colonoscopy in April 2017 with a colonoscopy showing a adenomatous polyp and stomach biopsy showing reactive gastritis.  There is nothing abnormal about the esophagus.  The patient had been taking Dexilant for his heartburn.  The patient reports that now he has been having acid breakthrough at night and has had vomiting in the morning.   Current Outpatient Medications  Medication Sig Dispense Refill  . albuterol (PROVENTIL HFA;VENTOLIN HFA) 108 (90 Base) MCG/ACT inhaler Inhale 2 puffs into the lungs every 6 (six) hours as needed for wheezing or shortness of  breath.    [name] albuterol (PROVENTIL) (2.5 MG/3ML) 0.083% nebulizer solution Take 2.5 mg by nebulization every 6 (six) hours as needed for wheezing or shortness of breath.    [name] aspirin EC 81 MG tablet Take 1 tablet (81 mg total) by mouth daily. 90 tablet 3  . benzonatate (TESSALON) 100 MG capsule Take 1-2 capsules (100-200 mg total) by mouth 2 (two) times daily as needed. 40 capsule 0  . BREO ELLIPTA 200-25 MCG/INH AEPB INHALE 1 PUFF INTO THE LUNGS DAILY (Patient taking differently: Inhale 1 puff into the lungs daily. ) 1 each 3  . carvedilol (COREG) 12.5 MG tablet Take 1 tablet (12.5 mg total) by mouth 2 (two) times daily. 180 tablet 1  . clopidogrel (PLAVIX) 75 MG tablet Take 1 tablet (75 mg total) by mouth daily with breakfast. 30 tablet 6  . cyanocobalamin 1000 MCG tablet Take 1,000 mcg by mouth daily.     . cyclobenzaprine (FEXMID) 7.5 MG tablet Take 7.5 mg by mouth 3 (three) times daily as needed for muscle spasms.    [name] dexlansoprazole (DEXILANT) 60 MG capsule Take 1 capsule (60 mg total) by mouth daily. 30 capsule 6  . ezetimibe (ZETIA) 10 MG tablet Take 1 tablet (10 mg total) by mouth daily. 90 tablet 3  . furosemide (LASIX) 20 MG tablet Take 0.5 tablets (10 mg total) by mouth daily. (Patient taking differently: Take 10 mg by mouth daily. Take 1/4 tablet daily) 90 tablet 3  . gabapentin (NEURONTIN) 300 MG capsule Take 1 capsule (300 mg total) by mouth 3 (three) times daily. [place]  capsule 1  . gabapentin (NEURONTIN) 800 MG tablet Take 800 mg by mouth at bedtime.    . insulin lispro (HUMALOG KWIKPEN) 100 UNIT/ML KiwkPen Inject 7-8 Units into the skin 3 (three) times daily. Reported on 12/02/2015/ sliding scale 1 unit for every 8 units of carbs; and 1 unit for every 20 above 120    . Insulin Pen Needle (FIFTY50 PEN NEEDLES) 32G X 4 MM MISC Inject 1 each into the skin as needed.    [name] LANTUS SOLOSTAR 100 UNIT/ML Solostar Pen Inject 25-30 Units into the skin daily at 10 pm.   0  . levocetirizine (XYZAL)  5 MG tablet TAKE 1 TABLET(5 MG) BY MOUTH EVERY EVENING 30 tablet 3  . liraglutide (VICTOZA) 18 MG/3ML SOPN 1.8 units every morning (Patient taking differently: Inject 1.8 mg into the skin daily. )    . loratadine (CLARITIN) 10 MG tablet Take 10 mg by mouth daily.     . metFORMIN (GLUCOPHAGE) 1000 MG tablet Take 1,000 mg by mouth 2 (two) times daily with a meal.    . naloxone (NARCAN) nasal spray 4 mg/0.1 mL For excess sedation from opioids 1 kit 2  . nitroGLYCERIN (NITROSTAT) 0.3 MG SL tablet Place 1 tablet (0.3 mg total) under the tongue every 5 (five) minutes as needed for chest pain. Maximum 3 doses. 25 tablet 3  . Oxycodone HCl 10 MG TABS Take 1 tablet (10 mg total) by mouth 4 (four) times daily for 30 days. 120 tablet 0  . predniSONE (DELTASONE) 20 MG tablet Take 1 tablet (20 mg total) by mouth 2 (two) times daily with a meal. 10 tablet 0  . rosuvastatin (CRESTOR) 5 MG tablet Take 5 mg by mouth daily.  0  . sacubitril-valsartan (ENTRESTO) 24-26 MG Take 1 tablet by mouth 2 (two) times daily. 60 tablet 1   No current facility-administered medications for this visit.     Allergies as of 12/27/2018 - Review Complete 12/12/2018  Allergen Reaction Noted  . Azathioprine Other (See Comments) 11/16/2018  . Metoprolol Rash 03/03/2017  . Novolog [insulin aspart] Hives 09/24/2015    Physical Examination: N/A   Labs:    Imaging Studies: No results found.  Assessment and Plan:   [name] is a 58 y.o. y/o male who has a history of heartburn and states that he takes his Dexilant at approximately 9:00 at night.  The patient has breakthrough acid reflux in the morning and at night.  He also reports that he has vomiting in the morning but no other time during the day. The patient has been told to take the Dexilant 3 hours earlier thereby making the peak of the release of the medication just when he lays down at night.  He has been told that if this does not solve his problems he should  contact us and be started on a H2 blocker prior to going to sleep.  If they both do not work he may need to have his PPI switched to something else.  [name], MD. Marval Regal   Note: This dictation was prepared with Dragon dictation along with smaller phrase technology. Any transcriptional errors that result from this process are unintentional.

## 2018-12-27 ENCOUNTER — Encounter: Payer: Self-pay | Admitting: Family Medicine

## 2018-12-27 ENCOUNTER — Ambulatory Visit: Payer: Medicare Other | Admitting: Gastroenterology

## 2018-12-27 ENCOUNTER — Telehealth (INDEPENDENT_AMBULATORY_CARE_PROVIDER_SITE_OTHER): Payer: Medicare Other | Admitting: Gastroenterology

## 2018-12-27 ENCOUNTER — Other Ambulatory Visit: Payer: Self-pay

## 2018-12-27 DIAGNOSIS — K219 Gastro-esophageal reflux disease without esophagitis: Secondary | ICD-10-CM

## 2018-12-27 DIAGNOSIS — R1084 Generalized abdominal pain: Secondary | ICD-10-CM

## 2019-01-11 DIAGNOSIS — E1142 Type 2 diabetes mellitus with diabetic polyneuropathy: Secondary | ICD-10-CM | POA: Diagnosis not present

## 2019-01-11 DIAGNOSIS — Z794 Long term (current) use of insulin: Secondary | ICD-10-CM | POA: Diagnosis not present

## 2019-01-12 DIAGNOSIS — Z794 Long term (current) use of insulin: Secondary | ICD-10-CM | POA: Diagnosis not present

## 2019-01-12 DIAGNOSIS — E1165 Type 2 diabetes mellitus with hyperglycemia: Secondary | ICD-10-CM | POA: Diagnosis not present

## 2019-01-16 ENCOUNTER — Other Ambulatory Visit: Payer: Self-pay

## 2019-01-16 ENCOUNTER — Ambulatory Visit: Payer: Medicare Other | Attending: Anesthesiology | Admitting: Anesthesiology

## 2019-01-16 DIAGNOSIS — G894 Chronic pain syndrome: Secondary | ICD-10-CM

## 2019-01-16 DIAGNOSIS — F1190 Opioid use, unspecified, uncomplicated: Secondary | ICD-10-CM

## 2019-01-16 DIAGNOSIS — G7000 Myasthenia gravis without (acute) exacerbation: Secondary | ICD-10-CM

## 2019-01-16 DIAGNOSIS — M542 Cervicalgia: Secondary | ICD-10-CM

## 2019-01-16 DIAGNOSIS — M47817 Spondylosis without myelopathy or radiculopathy, lumbosacral region: Secondary | ICD-10-CM

## 2019-01-16 DIAGNOSIS — M5136 Other intervertebral disc degeneration, lumbar region: Secondary | ICD-10-CM

## 2019-01-16 DIAGNOSIS — A6923 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: Secondary | ICD-10-CM

## 2019-01-16 DIAGNOSIS — M5431 Sciatica, right side: Secondary | ICD-10-CM

## 2019-01-16 DIAGNOSIS — M4716 Other spondylosis with myelopathy, lumbar region: Secondary | ICD-10-CM

## 2019-01-16 DIAGNOSIS — M5432 Sciatica, left side: Secondary | ICD-10-CM

## 2019-01-16 MED ORDER — OXYCODONE HCL 10 MG PO TABS: 10.0000 mg | ORAL_TABLET | Freq: Four times a day (QID) | ORAL | 0 refills | Status: DC

## 2019-01-16 MED ORDER — CYCLOBENZAPRINE HCL 7.5 MG PO TABS: 7.5000 mg | ORAL_TABLET | Freq: Two times a day (BID) | ORAL | 3 refills | Status: DC | PRN

## 2019-01-16 MED ORDER — OXYCODONE HCL 10 MG PO TABS: 10.0000 mg | ORAL_TABLET | Freq: Four times a day (QID) | ORAL | 0 refills | Status: AC

## 2019-01-16 NOTE — Progress Notes (Signed)
Virtual Visit via Telephone Note  I connected with [name] on 01/16/19 at  3:00 PM EDT by telephone and verified that I am speaking with the correct person using two identifiers.   I discussed the limitations, risks, security and privacy concerns of performing an evaluation and management service by telephone and the availability of in person appointments. I also discussed with the patient that there may be a patient responsible charge related to this service. The patient expressed understanding and agreed to proceed.   History of Present Illness: In conversation over the phone with him, he continues to do well with his current regimen.  The quality characteristic and distribution of his low back pain are otherwise unchanged.  He is taking his medications as prescribed and tolerating these well.  Otherwise he is in his usual state of health.  He occasionally has persistent low back pain similar to the facet styled low back pain he has on baseline.  In reference to the quality of the pain is been stable with no new changes in strength reported.  Otherwise he is in his usual state of health.    Observations/Objective:   Assessment and Plan: 1. Cervicalgia   2. Facet arthritis of lumbosacral region   3. Chronic pain syndrome   4. Chronic, continuous use of opioids   5. DDD (degenerative disc disease), lumbar   6. 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 Lumbar spondylosis with myelopathy   9. Bilateral sciatica   Based on the above findings we will continue with her current regimen.  I am going to send in prescription refills for his oxycodone dated for April 16 and May 16.  This will be for 4 times daily dosing of the 10 mg strength.  I have reviewed the practitioner database information and it is appropriate.  We will have him return to clinic in approximately 2 months following the current viral crisis for reevaluation.   Follow Up Instructions:    I discussed the assessment and treatment plan with the patient. The patient was provided an opportunity to ask questions and all were answered. The patient agreed with the plan and demonstrated an understanding of the instructions.   The patient was advised to call back or seek an in-person evaluation if the symptoms worsen or if the condition fails to improve as anticipated.  I provided 20 minutes of non-face-to-face time during this encounter.   Molli Barrows, MD

## 2019-02-01 ENCOUNTER — Other Ambulatory Visit: Payer: Self-pay

## 2019-02-01 ENCOUNTER — Telehealth: Payer: Self-pay | Admitting: Cardiovascular Disease

## 2019-02-01 ENCOUNTER — Telehealth: Payer: Self-pay

## 2019-02-01 DIAGNOSIS — R0602 Shortness of breath: Secondary | ICD-10-CM

## 2019-02-01 MED ORDER — SACUBITRIL-VALSARTAN 24-26 MG PO TABS: 1.0000 | ORAL_TABLET | Freq: Two times a day (BID) | ORAL | 1 refills | Status: DC

## 2019-02-01 NOTE — Telephone Encounter (Signed)
Pt c/o Shortness Of Breath: STAT if SOB developed within the last 24 hours or pt is noticeably SOB on the phone  1. Are you currently SOB (can you hear that pt is SOB on the phone)? Not really  2. How long have you been experiencing SOB? 3 or 4 days  3. Are you SOB when sitting or when up moving around? Walking a short distance, for example walking down to end of driveway leaves him very winded  4. Are you currently experiencing any other symptoms? Lightheadedness, no energy, feet and ankles are swelling. Patient calling and states that for the last 3 or 4 days he has not felt well.  His BP was 90/69 and pulse 103.  Please call to discuss.

## 2019-02-01 NOTE — Telephone Encounter (Signed)
He needs labs ( cbc, BMP, BNP) and chest x ray then virtual visit

## 2019-02-01 NOTE — Telephone Encounter (Signed)
Spoke with the pt and made him aware of Dr. Tyrell Antonio recommendation. Orders placed labs Bmet, Cbc, Bnp and Cxxr. Adv the pt to have his testing done at [place]. Adv the pt that labs are not fasting and no appt is needed. Pt sts tha he will need to arrange transportation. He will try to have lab work tomorrow 4/30 or Fri 5/1. Adv the pt that Dr. Tyrell Antonio next available is 5/14. Pt sts that hes has seen Ignacia Bayley, NP in the past and is agreeable with a APP appt. Virtual visit with Gerald Stabs scheduled for 5/7 @ 11am. Pt ok to use doxy. Verbal consent obtained. Adv the pt we aill contact him with results he is to contact the office sooner if symptoms worsen. Pt verbalized understanding and voiced appreciation for the assistance.

## 2019-02-01 NOTE — Telephone Encounter (Addendum)
Spoke with the pt. Pt sts that he has developed increased sob and fatigue with exertion, increased LE edema, dizziness when standing. Symptoms developed 3-4 days ago. Pt sts that he is unable to walk to his mail box without getting winded. He weighs daily and sts that his weight has been stable. He denies chest pain, palpitations, cough, orthopnea, PND. He is taking Lasix 10mg  everyother day. Pt sts that when he takes lasix daily he tends to become dehydrated. His last dose of lasix was taken yesterday, he has not taken any today. Pt lunchtime BP and HR 90/69 103bpm.  Adv the pt that I will fwd the message to [facility] and we will call back with his recommendation. Pt agreeable with the plan.

## 2019-02-01 NOTE — Telephone Encounter (Signed)
Verbal consent obtained. Patient has the Doxy app previously downloaded.  YOUR CARDIOLOGY TEAM HAS ARRANGED FOR AN E-VISIT FOR YOUR APPOINTMENT - PLEASE REVIEW IMPORTANT INFORMATION BELOW SEVERAL DAYS PRIOR TO YOUR APPOINTMENT  Due to the recent COVID-19 pandemic, we are transitioning in-person office visits to tele-medicine visits in an effort to decrease unnecessary exposure to our patients, their families, and staff. These visits are billed to your insurance just like a normal visit is. We also encourage you to sign up for MyChart if you have not already done so. You will need a smartphone if possible. For patients that do not have this, we can still complete the visit using a regular telephone but do prefer a smartphone to enable video when possible. You may have a family member that lives with you that can help. If possible, we also ask that you have a blood pressure cuff and scale at home to measure your blood pressure, heart rate and weight prior to your scheduled appointment. Patients with clinical needs that need an in-person evaluation and testing will still be able to come to the office if absolutely necessary. If you have any questions, feel free to call our office.     YOUR PROVIDER WILL BE USING THE FOLLOWING PLATFORM TO COMPLETE YOUR VISIT: 02/09/19  . IF USING MYCHART - How to Download the MyChart App to Your SmartPhone   - If Apple, go to [employer] and type in MyChart in the search bar and download the app. If Android, ask patient to go to [employer] and type in [place] in the search bar and download the app. The app is free but as with any other app downloads, your phone may require you to verify saved payment information or Apple/Android password.  - You will need to then log into the app with your MyChart username and password, and select [facility] as your healthcare provider to link the account.  - When it is time for your visit, go to the MyChart app, find appointments,  and click Begin Video Visit. Be sure to Select Allow for your device to access the Microphone and Camera for your visit. You will then be connected, and your provider will be with you shortly.  **If you have any issues connecting or need assistance, please contact MyChart service desk [phone])**  **If using a computer, in order to ensure the best quality for your visit, you will need to use either of the following Internet Browsers: Insurance underwriter or [employer]**  . IF USING DOXIMITY or [email or web address] - The staff will give you instructions on receiving your link to join the meeting the day of your visit.      2-3 DAYS BEFORE YOUR APPOINTMENT  You will receive a telephone call from one of our [facility] team members - your caller ID may say "Unknown caller." If this is a video visit, we will walk you through how to get the video launched on your phone. We will remind you check your blood pressure, heart rate and weight prior to your scheduled appointment. If you have an Apple Watch or Kardia, please upload any pertinent ECG strips the day before or morning of your appointment to [facility]. Our staff will also make sure you have reviewed the consent and agree to move forward with your scheduled tele-health visit.     THE DAY OF YOUR APPOINTMENT  Approximately 15 minutes prior to your scheduled appointment, you will receive a telephone call from one of  [facility] team - your caller ID may say "Unknown caller."  Our staff will confirm medications, vital signs for the day and any symptoms you may be experiencing. Please have this information available prior to the time of visit start. It may also be helpful for you to have a pad of paper and pen handy for any instructions given during your visit. They will also walk you through joining the smartphone meeting if this is a video visit.    CONSENT FOR TELE-HEALTH VISIT - PLEASE REVIEW  I hereby voluntarily request, consent and authorize  [facility] and its employed or contracted physicians, physician assistants, nurse practitioners or other licensed health care professionals (the Practitioner), to provide me with telemedicine health care services (the "Services") as deemed necessary by the treating Practitioner. I acknowledge and consent to receive the Services by the Practitioner via telemedicine. I understand that the telemedicine visit will involve communicating with the Practitioner through live audiovisual communication technology and the disclosure of certain medical information by electronic transmission. I acknowledge that I have been given the opportunity to request an in-person assessment or other available alternative prior to the telemedicine visit and am voluntarily participating in the telemedicine visit.  I understand that I have the right to withhold or withdraw my consent to the use of telemedicine in the course of my care at any time, without affecting my right to future care or treatment, and that the Practitioner or I may terminate the telemedicine visit at any time. I understand that I have the right to inspect all information obtained and/or recorded in the course of the telemedicine visit and may receive copies of available information for a reasonable fee.  I understand that some of the potential risks of receiving the Services via telemedicine include:  [name] Delay or interruption in medical evaluation due to technological equipment failure or disruption; . Information transmitted may not be sufficient (e.g. poor resolution of images) to allow for appropriate medical decision making by the Practitioner; and/or  . In rare instances, security protocols could fail, causing a breach of personal health information.  Furthermore, I acknowledge that it is my responsibility to provide information about my medical history, conditions and care that is complete and accurate to the best of my ability. I acknowledge that  Practitioner's advice, recommendations, and/or decision may be based on factors not within their control, such as incomplete or inaccurate data provided by me or distortions of diagnostic images or specimens that may result from electronic transmissions. I understand that the practice of medicine is not an exact science and that Practitioner makes no warranties or guarantees regarding treatment outcomes. I acknowledge that I will receive a copy of this consent concurrently upon execution via email to the email address I last provided but may also request a printed copy by calling the office of [facility].    I understand that my insurance will be billed for this visit.   I have read or had this consent read to me. . I understand the contents of this consent, which adequately explains the benefits and risks of the Services being provided via telemedicine.  . I have been provided ample opportunity to ask questions regarding this consent and the Services and have had my questions answered to my satisfaction. . I give my informed consent for the services to be provided through the use of telemedicine in my medical care  By participating in this telemedicine visit I agree to the above.

## 2019-02-02 ENCOUNTER — Other Ambulatory Visit: Payer: Self-pay | Admitting: Anesthesiology

## 2019-02-02 ENCOUNTER — Other Ambulatory Visit: Payer: Self-pay | Admitting: Gastroenterology

## 2019-02-03 ENCOUNTER — Telehealth: Payer: Self-pay | Admitting: *Deleted

## 2019-02-03 ENCOUNTER — Other Ambulatory Visit
Admission: RE | Admit: 2019-02-03 | Discharge: 2019-02-03 | Disposition: A | Discharge status: Home / Self Care | Payer: Medicare Other | Source: Home / Self Care | Attending: Cardiovascular Disease | Admitting: Cardiovascular Disease

## 2019-02-03 ENCOUNTER — Ambulatory Visit
Admission: RE | Admit: 2019-02-03 | Discharge: 2019-02-03 | Disposition: A | Discharge status: Home / Self Care | Payer: Medicare Other | Source: Ambulatory Visit | Attending: Cardiovascular Disease | Admitting: Cardiovascular Disease

## 2019-02-03 ENCOUNTER — Ambulatory Visit
Admission: RE | Admit: 2019-02-03 | Discharge: 2019-02-03 | Disposition: A | Discharge status: Home / Self Care | Payer: Medicare Other | Attending: Cardiovascular Disease | Admitting: Cardiovascular Disease

## 2019-02-03 DIAGNOSIS — R0602 Shortness of breath: Secondary | ICD-10-CM

## 2019-02-03 DIAGNOSIS — R0600 Dyspnea, unspecified: Secondary | ICD-10-CM | POA: Diagnosis not present

## 2019-02-03 LAB — CBC WITH DIFFERENTIAL/PLATELET
Abs Immature Granulocytes: 0.02 10*3/uL (ref 0.00–0.07)
Basophils Absolute: 0 10*3/uL (ref 0.0–0.1)
Basophils Relative: 1 %
Eosinophils Absolute: 0.3 10*3/uL (ref 0.0–0.5)
Eosinophils Relative: 6 %
HCT: 35.9 % — ABNORMAL LOW (ref 39.0–52.0)
Hemoglobin: 11.5 g/dL — ABNORMAL LOW (ref 13.0–17.0)
Immature Granulocytes: 0 %
Lymphocytes Relative: 21 %
Lymphs Abs: 1.2 10*3/uL (ref 0.7–4.0)
MCH: 27.5 pg (ref 26.0–34.0)
MCHC: 32 g/dL (ref 30.0–36.0)
MCV: 85.9 fL (ref 80.0–100.0)
Monocytes Absolute: 0.5 10*3/uL (ref 0.1–1.0)
Monocytes Relative: 8 %
Neutro Abs: 3.8 10*3/uL (ref 1.7–7.7)
Neutrophils Relative %: 64 %
Platelets: 178 10*3/uL (ref 150–400)
RBC: 4.18 MIL/uL — ABNORMAL LOW (ref 4.22–5.81)
RDW: 13.5 % (ref 11.5–15.5)
WBC: 5.9 10*3/uL (ref 4.0–10.5)
nRBC: 0 % (ref 0.0–0.2)

## 2019-02-03 LAB — BASIC METABOLIC PANEL
Anion gap: 9 (ref 5–15)
BUN: 23 mg/dL — ABNORMAL HIGH (ref 6–20)
CO2: 27 mmol/L (ref 22–32)
Calcium: 9.4 mg/dL (ref 8.9–10.3)
Chloride: 102 mmol/L (ref 98–111)
Creatinine, Ser: 1.26 mg/dL — ABNORMAL HIGH (ref 0.61–1.24)
GFR calc Af Amer: >60 mL/min (ref >60)
GFR calc non Af Amer: >60 mL/min (ref >60)
Glucose, Bld: 160 mg/dL — ABNORMAL HIGH (ref 70–99)
Potassium: 5 mmol/L (ref 3.5–5.1)
Sodium: 138 mmol/L (ref 135–145)

## 2019-02-03 LAB — BRAIN NATRIURETIC PEPTIDE: B Natriuretic Peptide: 56 pg/mL (ref 0.0–100.0)

## 2019-02-03 NOTE — Telephone Encounter (Signed)
-----   Message from [name], MD sent at 02/03/2019  2:03 PM EDT ----- Inform patient that labs were unremarkable including normal BNP.  Also his chest x-ray was fine.  No signs of congestive heart failure.  If he continues to be out of breath, he might need a stress test but that can be determined during his follow-up.

## 2019-02-03 NOTE — Telephone Encounter (Signed)
Left a message for the patient to call back.  

## 2019-02-06 NOTE — Telephone Encounter (Signed)
Incoming call from patient. Reviewed results from Dr. Fletcher Anon. Pt did not have any further questions at this time.   Reviewed upcoming procedures for appt/e visit this Thursday.   Best number included in appt notes.

## 2019-02-09 ENCOUNTER — Telehealth: Payer: Self-pay

## 2019-02-09 ENCOUNTER — Telehealth: Payer: Medicare Other | Admitting: Nurse Practitioner

## 2019-02-09 ENCOUNTER — Telehealth (INDEPENDENT_AMBULATORY_CARE_PROVIDER_SITE_OTHER): Payer: Medicare Other | Admitting: Nurse Practitioner

## 2019-02-09 ENCOUNTER — Other Ambulatory Visit: Payer: Self-pay

## 2019-02-09 ENCOUNTER — Encounter: Payer: Self-pay | Admitting: Nurse Practitioner

## 2019-02-09 VITALS — BP 88/55 | HR 104 | Ht 67.0 in | Wt 232.0 lb

## 2019-02-09 DIAGNOSIS — I5022 Chronic systolic (congestive) heart failure: Secondary | ICD-10-CM

## 2019-02-09 DIAGNOSIS — I200 Unstable angina: Secondary | ICD-10-CM

## 2019-02-09 DIAGNOSIS — I25110 Atherosclerotic heart disease of native coronary artery with unstable angina pectoris: Secondary | ICD-10-CM

## 2019-02-09 DIAGNOSIS — I10 Essential (primary) hypertension: Secondary | ICD-10-CM

## 2019-02-09 DIAGNOSIS — E119 Type 2 diabetes mellitus without complications: Secondary | ICD-10-CM

## 2019-02-09 DIAGNOSIS — Z794 Long term (current) use of insulin: Secondary | ICD-10-CM

## 2019-02-09 DIAGNOSIS — I255 Ischemic cardiomyopathy: Secondary | ICD-10-CM

## 2019-02-09 DIAGNOSIS — E785 Hyperlipidemia, unspecified: Secondary | ICD-10-CM

## 2019-02-09 MED ORDER — CARVEDILOL 12.5 MG PO TABS: 6.2500 mg | ORAL_TABLET | Freq: Two times a day (BID) | ORAL | 1 refills | Status: DC

## 2019-02-09 NOTE — Progress Notes (Signed)
Virtual Visit via Video Note   This visit type was conducted due to national recommendations for restrictions regarding the COVID-19 Pandemic (e.g. social distancing) in an effort to limit this patient's exposure and mitigate transmission in our community.  Due to his co-morbid illnesses, this patient is at least at moderate risk for complications without adequate follow up.  This format is felt to be most appropriate for this patient at this time.  All issues noted in this document were discussed and addressed.  A limited physical exam was performed with this format.  Please refer to the patient's chart for his consent to telehealth for [facility]. Evaluation Performed:  Follow-up visit  This visit type was conducted due to national recommendations for restrictions regarding the COVID-19 Pandemic (e.g. social distancing).  This format is felt to be most appropriate for this patient at this time.  All issues noted in this document were discussed and addressed.  No physical exam was performed (except for noted visual exam findings with Video Visits).  Please refer to the patient's chart (MyChart message for video visits and phone note for telephone visits) for the patient's consent to telehealth for [facility]. _____________   Date:  02/09/2019   Patient ID:  [name], DOB 1961/03/27, MRN [id number] Patient Location:  home Provider location:   office  Primary Care Provider:  [name], MD Primary Cardiologist:  [name], MD  Chief Complaint   58 year old male with a history of CAD status post multiple PCI's, hypertension, hyperlipidemia, type 2 diabetes mellitus, diabetic neuropathy, myasthenia gravis, sepsis, and obstructive sleep apnea, who presents for follow-up related to recurrent angina.  Past Medical History    Past Medical History:  Diagnosis Date  . Allergy    dust, seasonal (worse in the fall).  . 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 Followed by Pain  Specialist in [place], back and neck  . Asthma    BRONCHITIS  . Bacterial infection, unspecified    patient stated that it is unknown origin  . Cataract    First Dx in 2012  . Chronic combined systolic and diastolic congestive heart failure ([place])    a. 03/2018 Echo: EF 30-35%, ant, antlat, apical AK, Gr1 DD; b. 07/2018 Echo: EF 35-40%, anteroseptal, apical, and ant HK. Gr1 DD.  [name] Coronary artery disease    a. Prior Ant MI->s/p multiple stents placed in the LAD and right coronary artery ([place]); b. 2016 Cath: reportedly nonobs dzs;  c. 02/2017 MV: EF 45-54%, ap/ant, ap/inf, apical infarct, no ishcemia; c. 04/2018 Cath/PCI: LM nl, LAD 20p, patent mid stent, LCX 24m(3.25x15 [place] DES), OM1 nl, OM2 50, OM3 40 w/ patent stent, RCA 40p, 38m, 40d w/ patent stent in RPDA, RPAV 60, EF 25-35%. 2+MR.  . Diabetes mellitus without complication ([place])    TYPE 2  . Diabetic peripheral neuropathy (HCC)    feet and hands  . FUO (fever of unknown origin) 08/03/2018  . GERD (gastroesophageal reflux disease)   . Headache    muscle tension  . Hyperlipidemia   . Hypertension    CONTROLLED ON MEDS  . Insomnia   . Ischemic cardiomyopathy    a. 03/2018 Echo: EF 30-35%, ant, antlat, apical AK, Gr1 DD, mild MR, mildly dil LA; b. 07/2018 Echo: EF 35-40%.  . Lyme disease    Chronic  . Myasthenia gravis ([place])   . Myocardial infarction ([place]) 2010  . Seasonal allergies   . Sleep apnea    CPAP  Past Surgical History:  Procedure Laterality Date  . BILATERAL CARPAL TUNNEL RELEASE Bilateral L in 2012 and R in 2013  . CARDIAC CATHETERIZATION     Several Caths, most recent in  March 2016.  [name] COLONOSCOPY WITH PROPOFOL N/A 01/10/2016   Procedure: COLONOSCOPY WITH PROPOFOL;  Surgeon: [name], MD;  Location: [facility];  Service: Endoscopy;  Laterality: N/A;  . CORONARY ANGIOPLASTY    . CORONARY STENT INTERVENTION N/A 04/25/2018   Procedure: CORONARY STENT INTERVENTION;  Surgeon: [name], MD;  Location:  [place] [facility];  Service: Cardiovascular;  Laterality: N/A;  . ESOPHAGOGASTRODUODENOSCOPY (EGD) WITH PROPOFOL N/A 01/10/2016   Procedure: ESOPHAGOGASTRODUODENOSCOPY (EGD) WITH PROPOFOL;  Surgeon: [name], MD;  Location: [facility] ENDOSCOPY;  Service: Endoscopy;  Laterality: N/A;  . EYE SURGERY Bilateral 2012   cataract/bilateral vitrectomies  . LEFT HEART CATH AND CORONARY ANGIOGRAPHY Left 04/25/2018   Procedure: LEFT HEART CATH AND CORONARY ANGIOGRAPHY;  Surgeon: [name], MD;  Location: [place] [facility];  Service: Cardiovascular;  Laterality: Left;  . TEE WITHOUT CARDIOVERSION N/A 09/05/2018   Procedure: TRANSESOPHAGEAL ECHOCARDIOGRAM (TEE);  Surgeon: [name], MD;  Location: [facility] ORS;  Service: Cardiovascular;  Laterality: N/A;  . TONSILLECTOMY AND ADENOIDECTOMY     As a child  . TUNNELED VENOUS CATHETER PLACEMENT     removed    Allergies  Allergies  Allergen Reactions  . Azathioprine Other (See Comments)    Azathioprine hypersensitivity reaction  . Metoprolol Rash  . Novolog [Insulin Aspart] Hives    History of Present Illness    [name] is a 58 y.o. male who presents via audio/video conferencing for a telehealth visit today.  As above, he has a history of CAD status post prior myocardial infarction in 2009 with subsequent placement of a total of 10 stents between the LAD and right coronary artery in [place].  Other history includes hypertension, hyperlipidemia, type 2 diabetes mellitus, sinus tachycardia, diabetic neuropathy, myasthenia gravis, GERD, sleep apnea on CPAP, sepsis, and ischemic cardiomyopathy.  In June 2019, he had worsening dyspnea on exertion and nitrate responsive angina at rest.  An echocardiogram showed an EF of 30 to 35%.  Catheterization showed a new 90% stenosis in the mid left circumflex with multiple patent LAD, RCA, and OM 2 stents.  The circumflex was successfully treated with a drug-eluting stent and he was placed on  guideline directed therapy for cardiomyopathy.  In October 2020, he was admitted with disorientation, fever, and vomiting.  He was treated for sepsis and during that hospitalization, troponin rose to 11.48.  Echocardiogram showed persistent LV dysfunction with an EF of 30 to 35%.  Catheterization was deferred in the setting of sepsis.  He was readmitted in mid October with recurrent fevers and chills.  MRIs of the brain and spine did not show any source of infection while TEE in 09/2018 was negative for vegetation (EF 40-45% @ that time).  He was last seen in clinic in March of this year.  He reported stable, nitrate responsive angina and overall was doing well.  Unfortunately, beginning approximately 2 weeks ago, he started to note increasing dyspnea on exertion as well as more frequent episodic nitrate responsive exertional chest discomfort.  Symptoms are similar to what he experienced last summer.  He has not had any change in weight, PND, orthopnea, dizziness, syncope, palpitations, edema, or early satiety.  He contacted our office to notify us of the symptoms and then followed up for blood  work showing stable renal function, a BNP of 56, and stable blood counts.  Chest x-ray was also performed and was without active cardiopulmonary disease.  Unfortunately, he has continued to have dyspnea on exertion and nitrate responsive chest discomfort, but has not had any resting symptoms.  The patient does not have symptoms concerning for COVID-19 infection (fever, chills, cough, or new shortness of breath).   Home Medications    Prior to Admission medications   Medication Sig Start Date End Date Taking? Authorizing Provider  albuterol (PROVENTIL HFA;VENTOLIN HFA) 108 (90 Base) MCG/ACT inhaler Inhale 2 puffs into the lungs every 6 (six) hours as needed for wheezing or shortness of breath.   Yes [provider]  albuterol (PROVENTIL) (2.5 MG/3ML) 0.083% nebulizer solution Take 2.5 mg by nebulization  every 6 (six) hours as needed for wheezing or shortness of breath.   Yes [provider]  aspirin EC 81 MG tablet Take 1 tablet (81 mg total) by mouth daily. 03/28/18  Yes Theora Gianotti, NP  BREO ELLIPTA 200-25 MCG/INH AEPB INHALE 1 PUFF INTO THE LUNGS DAILY Patient taking differently: Inhale 1 puff into the lungs daily.  06/08/18  Yes Poulose, Bethel Born, NP  carvedilol (COREG) 12.5 MG tablet Take 1 tablet (12.5 mg total) by mouth 2 (two) times daily. 09/08/18  Yes [name], MD  cholecalciferol (VITAMIN D3) 25 MCG (1000 UT) tablet Take 2,000 Units by mouth daily.   Yes [provider]  clopidogrel (PLAVIX) 75 MG tablet Take 1 tablet (75 mg total) by mouth daily with breakfast. 12/14/18  Yes Theora Gianotti, NP  cyanocobalamin 1000 MCG tablet Take 1,000 mcg by mouth daily.    Yes [provider]  cyclobenzaprine (FEXMID) 7.5 MG tablet Take 1 tablet (7.5 mg total) by mouth 2 (two) times daily as needed for up to 30 days for muscle spasms. 02/05/19 03/07/19 Yes Molli Barrows, MD  DEXILANT 60 MG capsule TAKE ONE CAPSULE BY MOUTH DAILY 02/02/19  Yes [name], MD  ezetimibe (ZETIA) 10 MG tablet Take 1 tablet (10 mg total) by mouth daily. 04/11/18 02/09/28 Yes Theora Gianotti, NP  furosemide (LASIX) 20 MG tablet Take 20 mg by mouth every other day.   Yes [provider]  gabapentin (NEURONTIN) 300 MG capsule Take 1 capsule (300 mg total) by mouth 3 (three) times daily. 09/25/15  Yes Roselee Nova, MD  gabapentin (NEURONTIN) 800 MG tablet Take 800 mg by mouth at bedtime.   Yes [provider]  insulin lispro (HUMALOG KWIKPEN) 100 UNIT/ML KiwkPen Inject 7-8 Units into the skin 3 (three) times daily. Reported on 12/02/2015/ sliding scale 1 unit for every 8 units of carbs; and 1 unit for every 20 above 120   Yes [provider]  Insulin Pen Needle (FIFTY50 PEN NEEDLES) 32G X 4 MM MISC Inject 1 each into the skin as  needed. 02/24/18  Yes [provider]  LANTUS SOLOSTAR 100 UNIT/ML Solostar Pen Inject 25-30 Units into the skin daily at 10 pm.  03/25/16  Yes Sherri Sear, MD  levocetirizine (XYZAL) 5 MG tablet TAKE 1 TABLET(5 MG) BY MOUTH EVERY EVENING 10/31/18  Yes Poulose, Bethel Born, NP  liraglutide (VICTOZA) 18 MG/3ML SOPN 1.8 units every morning Patient taking differently: Inject 1.8 mg into the skin daily.  04/26/18  Yes Theora Gianotti, NP  loratadine (CLARITIN) 10 MG tablet Take 10 mg by mouth daily.    Yes [provider]  metFORMIN (GLUCOPHAGE) 1000 MG tablet Take 1,000 mg by mouth 2 (two) times daily with a meal.   Yes Sherri Sear, MD  montelukast (SINGULAIR) 10 MG tablet Take 10 mg by mouth at bedtime. 12/02/18  Yes [provider]  naloxone [facility]) nasal spray 4 mg/0.1 mL For excess sedation from opioids 09/15/16  Yes Molli Barrows, MD  nitroGLYCERIN (NITROSTAT) 0.3 MG SL tablet Place 1 tablet (0.3 mg total) under the tongue every 5 (five) minutes as needed for chest pain. Maximum 3 doses. 08/18/18  Yes Theora Gianotti, NP  Oxycodone HCl 10 MG TABS Take 1 tablet (10 mg total) by mouth 4 (four) times daily for 30 days. 01/19/19 02/18/19 Yes Molli Barrows, MD  Oxycodone HCl 10 MG TABS Take 1 tablet (10 mg total) by mouth 4 (four) times daily for 30 days. 02/18/19 03/20/19 Yes Molli Barrows, MD  predniSONE (DELTASONE) 20 MG tablet Take 1 tablet (20 mg total) by mouth 2 (two) times daily with a meal. 12/12/18  Yes Sowles, Drue Stager, MD  rosuvastatin (CRESTOR) 5 MG tablet Take 5 mg by mouth daily. 11/20/17  Yes [name], MD  sacubitril-valsartan (ENTRESTO) 24-26 MG Take 1 tablet by mouth 2 (two) times daily. 02/01/19  Yes Theora Gianotti, NP    Review of Systems    Exertional chest discomfort and dyspnea as outlined above.  He denies palpitations, PND, orthopnea, dizziness, syncope, edema, or early satiety.  All other systems  reviewed and are otherwise negative except as noted above.  Physical Exam    Vital Signs:  BP (!) 88/55 (BP Location: Left Arm, Patient Position: Sitting)   Pulse (!) 104   Ht 5\' 7"  (1.702 m)   Wt 232 lb (105.2 kg)   BMI 36.34 kg/m    Exam limited by video based visit.  He appears well and in no acute distress.  Respirations appear regular and unlabored.  He is awake alert and oriented x3.  Accessory Clinical Findings   Lab Results  Component Value Date   CREATININE 1.26 (H) 02/03/2019   BUN 23 (H) 02/03/2019   NA 138 02/03/2019   K 5.0 02/03/2019   CL 102 02/03/2019   CO2 27 02/03/2019   Lab Results  Component Value Date   WBC 5.9 02/03/2019   HGB 11.5 (L) 02/03/2019   HCT 35.9 (L) 02/03/2019   MCV 85.9 02/03/2019   PLT 178 02/03/2019   Lab Results  Component Value Date   CHOL 109 05/18/2018   HDL 37 05/18/2018   LDLCALC 50 05/18/2018   TRIG 112 05/18/2018   CHOLHDL 3.4 01/17/2018    Lab Results  Component Value Date   HGBA1C 7.8 10/06/2018     Assessment & Plan    1. Coronary artery disease/unstable angina: Patient with a long history of CAD status post multiple percutaneous coronary interventions with his last occurring in June 2019 (left circumflex).  He subsequently had a non-STEMI in the setting of sepsis in October 2019 and this is been conservatively managed as he had been stable.  Over the past 2 weeks, he has noticed increasing dyspnea on exertion as well as more nitrate requirement for exertional chest tightness, then he is typically accustomed to.  Recent lab and x-ray evaluation were unremarkable and if anything reassuring.  As he has continued to have symptoms, given extensive CAD hx, I will arrange for a Lexiscan Myoview to assess for ischemia with plan for early follow-up to discuss results and  plan for diagnostic catheterization if warranted.  He remains on aspirin, beta-blocker, Plavix, and statin therapy.  As his blood pressure is 88 today and he  notes that sees drops into the 80s several times a week, I am reducing his carvedilol to 6.25 mg twice daily.  2.  HFrEF/ischemic cardiomyopathy: EF 40 to 45% by TEE in December 2019.  He feels that his volume has been stable, though he has gained a little bit of weight in the setting of inactivity.  Recent BNP was normal.  He remains on beta-blocker, Entresto, and Lasix therapy every other day.  As above, in the setting of soft blood pressure, I am reducing carvedilol to 6.25 mg twice daily.  No room to add Spironolactone (was also mildly hypokalemic on recent evaluation).  3.  Essential hypertension: As above, if anything, his blood pressure runs soft.  Reducing carvedilol.  4.  Hyperlipidemia: Tolerating rosuvastatin and Zetia.  LDL 50 in August 2019.  5.  Type 2 diabetes mellitus: A1c 7.8 and she followed closely by primary care.Consider SGLT2 inhibitor in the future.  6.  Sinus tachycardia: Chronic and persistent despite beta-blocker therapy and regardless of LV function.  Etiology unclear.  Having to reduce beta-blocker therapy in the setting of hypotension as above.    7.  Disposition: Follow-up Lexiscan Myoview next Monday with office follow-up shortly thereafter to discuss findings and consider plan forward.  COVID-19 Education: The signs and symptoms of COVID-19 were discussed with the patient and how to seek care for testing (follow up with PCP or arrange E-visit).  The importance of social distancing was discussed today.  Patient Risk:   After full review of this patient's history and clinical status, I feel that he is at least moderate risk for cardiac complications at this time, thus necessitating a telehealth visit sooner than our first available in office visit.  Time:   Today, I have spent 20 minutes with the patient with telehealth technology discussing unstable angina and plan for stress testing and potentially cath in the near future.    Murray Hodgkins, NP 02/09/2019,  11:58 AM

## 2019-02-09 NOTE — Telephone Encounter (Signed)
Call returned from patient. We reviewed instructions for myoview and current hospital covid procedure.   He verbalized understanding and had no further questions at this time.   Advised pt to call for any further questions or concerns.

## 2019-02-09 NOTE — Telephone Encounter (Signed)
Call to patient to review AVS/ myoview test procedure. No answer. LMTCB.

## 2019-02-09 NOTE — Patient Instructions (Signed)
It was a pleasure to speak with you on the phone today! Thank you for allowing [place] to continue taking care of your [facility] needs during this time.   Feel free to call as needed for questions and concerns related to your cardiac needs.   Medication Instructions:  Your physician has recommended you make the following change in your medication:  1- DECREASE Coreg to 1/2 tab (6.25 mg) twice daily.  If you need a refill on your cardiac medications before your next appointment, please call your pharmacy.   Lab work: None ordered If you have labs (blood work) drawn today and your tests are completely normal, you will receive your results only by: [name] MyChart Message (if you have MyChart) OR . A paper copy in the mail If you have any lab test that is abnormal or we need to change your treatment, we will call you to review the results.  Testing/Procedures: [place]  Your caregiver has ordered a Stress Test with nuclear imaging. The purpose of this test is to evaluate the blood supply to your heart muscle. This procedure is referred to as a "Non-Invasive Stress Test." This is because other than having an IV started in your vein, nothing is inserted or "invades" your body. Cardiac stress tests are done to find areas of poor blood flow to the heart by determining the extent of coronary artery disease (CAD). Some patients exercise on a treadmill, which naturally increases the blood flow to your heart, while others who are  unable to walk on a treadmill due to physical limitations have a pharmacologic/chemical stress agent called Lexiscan . This medicine will mimic walking on a treadmill by temporarily increasing your coronary blood flow.   Please note: these test may take anywhere between 2-4 hours to complete  PLEASE REPORT TO [place] AT THE FIRST DESK WILL DIRECT YOU WHERE TO GO  Date of Procedure:_____________________________________  Arrival Time for  Procedure:___1.5 hrs prior to appt time___________________  Instructions regarding medication:   ___x_ : Hold diabetes medication morning of procedure (only take 1/2 dose of nighttime insulin on the evening prior to procedure. Hold all insulin and metformin, the morning of your procedure)  __x__:  Hold betablocker(s) night before procedure and morning of procedure  (Coreg) __x__:  Hold other medications as follows:__furosemide_the morning of your procedure___________________________________________________________________ PLEASE CALL THE OFFICE AT LEAST 24 HOURS IN ADVANCE IF YOU ARE UNABLE TO KEEP YOUR APPOINTMENT.  [phone] AND  PLEASE NOTIFY NUCLEAR MEDICINE AT [facility] AT LEAST 24 HOURS IN ADVANCE IF YOU ARE UNABLE TO KEEP YOUR APPOINTMENT. [phone]  How to prepare for your Myoview test:  1. Do not eat or drink after midnight 2. No caffeine for 24 hours prior to test 3. No smoking 24 hours prior to test. 4. Your medication may be taken with water.  If your doctor stopped a medication because of this test, do not take that medication. 5. Ladies, please do not wear dresses.  Skirts or pants are appropriate. Please wear a short sleeve shirt. 6. No perfume, cologne or lotion. 7. Wear comfortable walking shoes. No heels!           Follow-Up: At [facility], you and your health needs are our priority.  As part of our continuing mission to provide you with exceptional heart care, we have created designated Provider Care Teams.  These Care Teams include your primary Cardiologist (physician) and Advanced Practice Providers (APPs -  Physician Assistants and  Nurse Practitioners) who all work together to provide you with the care you need, when you need it. You will need a follow up evisit appointment in 1 weeks.  Please see [name], MD.

## 2019-02-13 DIAGNOSIS — Z794 Long term (current) use of insulin: Secondary | ICD-10-CM | POA: Diagnosis not present

## 2019-02-13 DIAGNOSIS — E1165 Type 2 diabetes mellitus with hyperglycemia: Secondary | ICD-10-CM | POA: Diagnosis not present

## 2019-02-14 ENCOUNTER — Other Ambulatory Visit: Payer: Self-pay

## 2019-02-14 ENCOUNTER — Encounter
Admission: RE | Admit: 2019-02-14 | Discharge: 2019-02-14 | Disposition: A | Discharge status: Home / Self Care | Payer: Medicare Other | Source: Ambulatory Visit | Attending: Nurse Practitioner | Admitting: Nurse Practitioner

## 2019-02-14 DIAGNOSIS — I200 Unstable angina: Secondary | ICD-10-CM | POA: Diagnosis not present

## 2019-02-14 MED ORDER — REGADENOSON 0.4 MG/5ML IV SOLN
0.4000 mg | Freq: Once | INTRAVENOUS | Status: AC
  Administered 2019-02-14: 09:00:00 0.4 mg via INTRAVENOUS
  Filled 2019-02-14: qty 5

## 2019-02-14 MED ORDER — TECHNETIUM TC 99M TETROFOSMIN IV KIT
30.0000 | PACK | Freq: Once | INTRAVENOUS | Status: AC | PRN
  Administered 2019-02-14: 09:00:00 31.59 via INTRAVENOUS

## 2019-02-14 MED ORDER — TECHNETIUM TC 99M TETROFOSMIN IV KIT
10.0000 | PACK | Freq: Once | INTRAVENOUS | Status: AC | PRN
  Administered 2019-02-14: 10.83 via INTRAVENOUS

## 2019-02-15 ENCOUNTER — Telehealth: Payer: Self-pay

## 2019-02-15 LAB — NM MYOCAR MULTI W/SPECT W/WALL MOTION / EF
Estimated workload: 1 METS
Exercise duration (min): 0 min
Exercise duration (sec): 0 s
LV dias vol: 117 mL (ref 62–150)
LV sys vol: 48 mL
MPHR: 161 {beats}/min
Peak HR: 105 {beats}/min
Percent HR: 65 %
Rest HR: 88 {beats}/min
TID: 1.2

## 2019-02-15 NOTE — Telephone Encounter (Signed)
-----   Message from Theora Gianotti, NP sent at 02/15/2019  7:35 AM EDT ----- Stress test is similar to 2018 study - areas of prior heart attack noted w/o evidence for new reductions in blood flow.  This is an intermediate risk study. Keep f/u with Dr. Fletcher Anon on 5/15 to discuss further and based on symptoms, determine if further testing is warranted.

## 2019-02-15 NOTE — Telephone Encounter (Signed)
Pt made aware of myoview results and Allie Bossier, NP recommendation . Pt verbalized understanding.

## 2019-02-15 NOTE — Telephone Encounter (Signed)
Contacted the pt to give myoview results.lmtcb.

## 2019-02-17 ENCOUNTER — Other Ambulatory Visit: Payer: Self-pay

## 2019-02-17 ENCOUNTER — Telehealth (INDEPENDENT_AMBULATORY_CARE_PROVIDER_SITE_OTHER): Payer: Medicare Other | Admitting: Cardiovascular Disease

## 2019-02-17 ENCOUNTER — Encounter: Payer: Self-pay | Admitting: Cardiovascular Disease

## 2019-02-17 VITALS — BP 110/82 | HR 103 | Ht 67.0 in | Wt 235.0 lb

## 2019-02-17 DIAGNOSIS — I5022 Chronic systolic (congestive) heart failure: Secondary | ICD-10-CM

## 2019-02-17 DIAGNOSIS — I25118 Atherosclerotic heart disease of native coronary artery with other forms of angina pectoris: Secondary | ICD-10-CM

## 2019-02-17 MED ORDER — METOPROLOL SUCCINATE ER 100 MG PO TB24: 100.0000 mg | ORAL_TABLET | Freq: Every day | ORAL | 3 refills | Status: DC

## 2019-02-17 NOTE — Progress Notes (Signed)
lmov to schedule recall entered

## 2019-02-17 NOTE — Progress Notes (Signed)
Virtual Visit via Video Note   This visit type was conducted due to national recommendations for restrictions regarding the COVID-19 Pandemic (e.g. social distancing) in an effort to limit this patient's exposure and mitigate transmission in our community.  Due to his co-morbid illnesses, this patient is at least at moderate risk for complications without adequate follow up.  This format is felt to be most appropriate for this patient at this time.  All issues noted in this document were discussed and addressed.  A limited physical exam was performed with this format.  Please refer to the patient's chart for his consent to telehealth for [facility].   Date:  02/17/2019   ID:  [name], DOB 01/18/1961, MRN [id number]  Patient Location: Home Provider Location: Office  PCP:  [name], MD  Cardiologist:  [name], MD  Electrophysiologist:  None   Evaluation Performed:  Follow-Up Visit  Chief Complaint:  Chest pain  History of Present Illness:    [name] is a 58 y.o. male who was evaluated with a video visit for follow-up regarding recent worsening of angina.  He is followed for coronary artery disease and chronic systolic heart failure.  He has extensive cardiac history. He had a total of 10 stents placed (in LAD and RCA) starting in 2009 after he presented with myocardial infarction.  He has other chronic medical conditions that include diabetes, hypertension, hyperlipidemia,myasthenia gravis,  generalized pain due to reported Lyme disease and obesity.  Echocardiogram in January 2017 showed EF of 40 to 45% with severe anterior and apical hypokinesis. He has history of hyperlipidemia with intolerance to high-dose statins.  He had worsening heart failure in June of 2019.  Echocardiogram showed worsening LV systolic function with an EF of 30% with akinesis of the anterior, anterolateral and apical myocardium.  There was mild mitral regurgitation. Cardiac  catheterization in July showed patent stents in the LAD, OM's and right coronary artery.  There was a new mid left circumflex stenosis which was stented.  EF was 25 to 30%.  LVEDP was severely elevated.  The patient was started on furosemide.  Entresto was added. He had recurrent sepsis and fever of unknown origin starting in October 2019 with elevated troponin.  Repeat catheterization was not performed.  TEE was performed which showed no evidence of endocarditis.  EF was 40 to 45%.  The patient was seen recently by Ignacia Bayley for worsening exertional dyspnea and chest tightness.  This is happening after he walks mostly uphill and the discomfort lasts for about 10 minutes but it resolves quickly with sublingual nitroglycerin.  He underwent a Lexiscan Myoview which showed previous scars but no ischemia with moderately reduced LV systolic function.  Overall intermediate risk study.  The patient reports stable symptoms with no worsening.  The dose of carvedilol was decreased to 6.25 mg twice daily given low blood pressure.  He continues to be mildly tachycardic.   The patient does not have symptoms concerning for COVID-19 infection (fever, chills, cough, or new shortness of breath).    Past Medical History:  Diagnosis Date  . Allergy    dust, seasonal (worse in the fall).  . 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 Followed by Pain Specialist in [place], back and neck  . Asthma    BRONCHITIS  . Cataract    First Dx in 2012  . Chronic combined systolic and diastolic congestive heart failure ([place])    a. 03/2018 Echo: EF 30-35%, ant,  antlat, apical AK, Gr1 DD; b. 07/2018 Echo: EF 35-40%, anteroseptal, apical, and ant HK. Gr1 DD; c. 09/2019 TEE: EF 40-45%.  . Coronary artery disease    a. Prior Ant MI->s/p multiple stents placed in the LAD and right coronary artery ([place]); b. 2016 Cath: reportedly nonobs dzs;  c. 02/2017 MV: EF 45-54%, ap/ant, ap/inf, apical infarct, no ishcemia; c. 04/2018 Cath/PCI: LM nl, LAD  20p, patent mid stent, LCX 44m(3.25x15 [place] DES), OM1 nl, OM2 50, OM3 40 w/ patent stent, RCA 40p, 60m, 40d w/ patent stent in RPDA, RPAV 60, EF 25-35%. 2+MR.  . Diabetes mellitus without complication ([place])    TYPE 2  . Diabetic peripheral neuropathy (HCC)    feet and hands  . FUO (fever of unknown origin) 08/03/2018  . GERD (gastroesophageal reflux disease)   . Headache    muscle tension  . Hyperlipidemia   . Hypertension    CONTROLLED ON MEDS  . Insomnia   . Ischemic cardiomyopathy    a. 03/2018 Echo: EF 30-35%, ant, antlat, apical AK, Gr1 DD, mild MR, mildly dil LA; b. 07/2018 Echo: EF 35-40%; c. 09/2018 TEE: EF 40-45%, antsept, ant HK.  . Lyme disease    Chronic  . Myasthenia gravis ([place])   . Myocardial infarction ([place]) 2010  . Seasonal allergies   . Sepsis ([place])    a.07/2018 - unknown source. TEE neg for veg 09/2019.  [name] Sleep apnea    CPAP   Past Surgical History:  Procedure Laterality Date  . BILATERAL CARPAL TUNNEL RELEASE Bilateral L in 2012 and R in 2013  . CARDIAC CATHETERIZATION     Several Caths, most recent in  March 2016.  [name] COLONOSCOPY WITH PROPOFOL N/A 01/10/2016   Procedure: COLONOSCOPY WITH PROPOFOL;  Surgeon: [name], MD;  Location: [facility] ENDOSCOPY;  Service: Endoscopy;  Laterality: N/A;  . CORONARY ANGIOPLASTY    . CORONARY STENT INTERVENTION N/A 04/25/2018   Procedure: CORONARY STENT INTERVENTION;  Surgeon: [name], MD;  Location: [facility];  Service: Cardiovascular;  Laterality: N/A;  . ESOPHAGOGASTRODUODENOSCOPY (EGD) WITH PROPOFOL N/A 01/10/2016   Procedure: ESOPHAGOGASTRODUODENOSCOPY (EGD) WITH PROPOFOL;  Surgeon: [name], MD;  Location: [facility] ENDOSCOPY;  Service: Endoscopy;  Laterality: N/A;  . EYE SURGERY Bilateral 2012   cataract/bilateral vitrectomies  . LEFT HEART CATH AND CORONARY ANGIOGRAPHY Left 04/25/2018   Procedure: LEFT HEART CATH AND CORONARY ANGIOGRAPHY;  Surgeon: [name], MD;  Location: [facility];  Service: Cardiovascular;  Laterality: Left;  . TEE WITHOUT CARDIOVERSION N/A 09/05/2018   Procedure: TRANSESOPHAGEAL ECHOCARDIOGRAM (TEE);  Surgeon: [name], MD;  Location: [facility] ORS;  Service: Cardiovascular;  Laterality: N/A;  . TONSILLECTOMY AND ADENOIDECTOMY     As a child  . TUNNELED VENOUS CATHETER PLACEMENT     removed     Current Meds  Medication Sig  . albuterol (PROVENTIL HFA;VENTOLIN HFA) 108 (90 Base) MCG/ACT inhaler Inhale 2 puffs into the lungs every 6 (six) hours as needed for wheezing or shortness of breath.  [name] albuterol (PROVENTIL) (2.5 MG/3ML) 0.083% nebulizer solution Take 2.5 mg by nebulization every 6 (six) hours as needed for wheezing or shortness of breath.  [name] aspirin EC 81 MG tablet Take 1 tablet (81 mg total) by mouth daily.  [name] BREO ELLIPTA 200-25 MCG/INH AEPB INHALE 1 PUFF INTO THE LUNGS DAILY (Patient taking differently: Inhale 1 puff into the lungs daily. )  . carvedilol (COREG) 12.5 MG tablet Take 0.5 tablets (6.25  mg total) by mouth 2 (two) times daily.  . cholecalciferol (VITAMIN D3) 25 MCG (1000 UT) tablet Take 2,000 Units by mouth daily.  . clopidogrel (PLAVIX) 75 MG tablet Take 1 tablet (75 mg total) by mouth daily with breakfast.  . cyanocobalamin 1000 MCG tablet Take 1,000 mcg by mouth daily.   . cyclobenzaprine (FEXMID) 7.5 MG tablet Take 1 tablet (7.5 mg total) by mouth 2 (two) times daily as needed for up to 30 days for muscle spasms.  . DEXILANT 60 MG capsule TAKE ONE CAPSULE BY MOUTH DAILY  . ezetimibe (ZETIA) 10 MG tablet Take 1 tablet (10 mg total) by mouth daily.  . furosemide (LASIX) 20 MG tablet Take 20 mg by mouth every other day.  . gabapentin (NEURONTIN) 300 MG capsule Take 1 capsule (300 mg total) by mouth 3 (three) times daily.  [name] gabapentin (NEURONTIN) 800 MG tablet Take 800 mg by mouth at bedtime.  . insulin lispro (HUMALOG KWIKPEN) 100 UNIT/ML KiwkPen Inject 7-8 Units into the skin 3 (three) times daily. Reported on  12/02/2015/ sliding scale 1 unit for every 8 units of carbs; and 1 unit for every 20 above 120  . Insulin Pen Needle (FIFTY50 PEN NEEDLES) 32G X 4 MM MISC Inject 1 each into the skin as needed.  [name] LANTUS SOLOSTAR 100 UNIT/ML Solostar Pen Inject 25-30 Units into the skin daily at 10 pm.   . levocetirizine (XYZAL) 5 MG tablet TAKE 1 TABLET(5 MG) BY MOUTH EVERY EVENING  . liraglutide (VICTOZA) 18 MG/3ML SOPN 1.8 units every morning (Patient taking differently: Inject 1.8 mg into the skin daily. )  . loratadine (CLARITIN) 10 MG tablet Take 10 mg by mouth daily.   . metFORMIN (GLUCOPHAGE) 1000 MG tablet Take 1,000 mg by mouth 2 (two) times daily with a meal.  . montelukast (SINGULAIR) 10 MG tablet Take 10 mg by mouth at bedtime.  . nitroGLYCERIN (NITROSTAT) 0.3 MG SL tablet Place 1 tablet (0.3 mg total) under the tongue every 5 (five) minutes as needed for chest pain. Maximum 3 doses.  . Oxycodone HCl 10 MG TABS Take 1 tablet (10 mg total) by mouth 4 (four) times daily for 30 days.  . rosuvastatin (CRESTOR) 5 MG tablet Take 5 mg by mouth daily.  . sacubitril-valsartan (ENTRESTO) 24-26 MG Take 1 tablet by mouth 2 (two) times daily.     Allergies:   Azathioprine; Metoprolol; and Novolog [insulin aspart]   Social History   Tobacco Use  . Smoking status: Never Smoker  . Smokeless tobacco: Never Used  . Tobacco comment: smoking cessation materials not required  Substance Use Topics  . Alcohol use: Not Currently    Alcohol/week: 0.0 standard drinks  . Drug use: No     Family Hx: The patient's family history includes Cancer in his father; Diabetes in his brother and mother; Healthy in his brother and brother; Heart disease in his mother.  ROS:   Please see the history of present illness.     All other systems reviewed and are negative.   Prior CV studies:   The following studies were reviewed today:  Reviewed results of Lexiscan Myoview with him  Labs/Other Tests and Data Reviewed:     EKG:  No ECG reviewed.  Recent Labs: 08/03/2018: ALT 17 02/03/2019: B Natriuretic Peptide 56.0; BUN 23; Creatinine, Ser 1.26; Hemoglobin 11.5; Platelets 178; Potassium 5.0; Sodium 138   Recent Lipid Panel Lab Results  Component Value Date/Time   CHOL 109 05/18/2018  CHOL 113 01/17/2018 08:19 AM   TRIG 112 05/18/2018   HDL 37 05/18/2018   HDL 33 (L) 01/17/2018 08:19 AM   CHOLHDL 3.4 01/17/2018 08:19 AM   CHOLHDL 3.7 11/29/2017 11:54 AM   LDLCALC 50 05/18/2018   LDLCALC 62 01/17/2018 08:19 AM    Wt Readings from Last 3 Encounters:  02/17/19 235 lb (106.6 kg)  02/09/19 232 lb (105.2 kg)  12/12/18 237 lb 11.2 oz (107.8 kg)     Objective:    Vital Signs:  BP 110/82   Pulse (!) 103   Ht 5\' 7"  (1.702 m)   Wt 235 lb (106.6 kg)   BMI 36.81 kg/m    VITAL SIGNS:  reviewed GEN:  no acute distress EYES:  sclerae anicteric, EOMI - Extraocular Movements Intact RESPIRATORY:  normal respiratory effort, symmetric expansion CARDIOVASCULAR:  no peripheral edema SKIN:  no rash, lesions or ulcers. MUSCULOSKELETAL:  no obvious deformities. NEURO:  alert and oriented x 3, no obvious focal deficit PSYCH:  normal affect  ASSESSMENT & PLAN:    1. Coronary artery disease with stable angina: Extensive history of coronary artery disease.  Symptoms are consistent with class III angina that started about 1 month ago. Lexiscan Myoview showed previous scars but no ischemia and was intermediate risk. I do suspect that sinus tachycardia is contributing to some of his anginal symptoms and this has been a chronic issue for him.  I am going to switch him to Toprol-XL 100 mg once daily from carvedilol to see if we can control his tachycardia better with this.  There is reported history of rash with metoprolol in the past but the patient is not certain that the rash was due to that medication.  I think it is worth trying anyway.  If no improvement in symptoms, the next step is to proceed with cardiac  catheterization.  2.  HFrEF/ischemic cardiomyopathy: EF 40 to 45% by TEE in December 2019.    He seems to be euvolemic.  Recent BNP was normal.  He remains on beta-blocker, Entresto, and Lasix therapy every other day.   No room to add Spironolactone (was also mildly hypokalemic on recent evaluation).  3.  Essential hypertension: As above, if anything, his blood pressure runs soft.  Reducing carvedilol.  4.  Hyperlipidemia: Tolerating rosuvastatin and Zetia.  LDL 50 in August 2019.  5.  Type 2 diabetes mellitus: A1c 7.8 and she followed closely by primary care.Consider SGLT2 inhibitor in the future.   COVID-19 Education: The signs and symptoms of COVID-19 were discussed with the patient and how to seek care for testing (follow up with PCP or arrange E-visit).  The importance of social distancing was discussed today.  Time:   Today, I have spent 15 minutes with the patient with telehealth technology discussing the above problems.     Medication Adjustments/Labs and Tests Ordered: Current medicines are reviewed at length with the patient today.  Concerns regarding medicines are outlined above.   Tests Ordered: No orders of the defined types were placed in this encounter.   Medication Changes: No orders of the defined types were placed in this encounter.   Disposition:  Follow up in 1 month(s)  Signed, [name], MD  02/17/2019 10:48 AM    [facility]

## 2019-02-17 NOTE — Patient Instructions (Signed)
Medication Instructions:  Stop taking carvedilol. Start metoprolol succinate 100 mg once daily If you need a refill on your cardiac medications before your next appointment, please call your pharmacy.   Lab work: None If you have labs (blood work) drawn today and your tests are completely normal, you will receive your results only by: [name] MyChart Message (if you have MyChart) OR . A paper copy in the mail If you have any lab test that is abnormal or we need to change your treatment, we will call you to review the results.  Testing/Procedures: None  Follow-Up: Virtual visit in 1 month

## 2019-02-19 ENCOUNTER — Other Ambulatory Visit: Payer: Self-pay | Admitting: Nurse Practitioner

## 2019-02-19 DIAGNOSIS — J302 Other seasonal allergic rhinitis: Secondary | ICD-10-CM

## 2019-02-28 ENCOUNTER — Ambulatory Visit: Payer: Medicare Other | Attending: Anesthesiology | Admitting: Anesthesiology

## 2019-02-28 ENCOUNTER — Encounter: Payer: Self-pay | Admitting: Anesthesiology

## 2019-02-28 ENCOUNTER — Other Ambulatory Visit: Payer: Self-pay

## 2019-02-28 DIAGNOSIS — M5136 Other intervertebral disc degeneration, lumbar region: Secondary | ICD-10-CM

## 2019-02-28 DIAGNOSIS — F1190 Opioid use, unspecified, uncomplicated: Secondary | ICD-10-CM | POA: Diagnosis not present

## 2019-02-28 DIAGNOSIS — M5432 Sciatica, left side: Secondary | ICD-10-CM

## 2019-02-28 DIAGNOSIS — M47817 Spondylosis without myelopathy or radiculopathy, lumbosacral region: Secondary | ICD-10-CM | POA: Diagnosis not present

## 2019-02-28 DIAGNOSIS — M542 Cervicalgia: Secondary | ICD-10-CM | POA: Diagnosis not present

## 2019-02-28 DIAGNOSIS — G894 Chronic pain syndrome: Secondary | ICD-10-CM

## 2019-02-28 DIAGNOSIS — M4716 Other spondylosis with myelopathy, lumbar region: Secondary | ICD-10-CM

## 2019-02-28 DIAGNOSIS — M51369 Other intervertebral disc degeneration, lumbar region without mention of lumbar back pain or lower extremity pain: Secondary | ICD-10-CM

## 2019-02-28 DIAGNOSIS — A6923 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: Secondary | ICD-10-CM

## 2019-02-28 DIAGNOSIS — G7000 Myasthenia gravis without (acute) exacerbation: Secondary | ICD-10-CM

## 2019-02-28 IMAGING — MR MR BRAIN/IAC WO/W
9 of 13 series · 26 of 48 positions shown · IV contrast (Gadavist)
Comparison: 08/15/2018

CLINICAL DATA: Asymmetric left sensorineural hearing loss.
Bilateral tinnitus. Imbalance. Symptoms for 3 weeks. Recent
hospitalization for sepsis.

EXAM:
MRI HEAD WITHOUT AND WITH CONTRAST
TECHNIQUE: Multiplanar, multiecho pulse sequences of the brain and surrounding
structures were obtained without and with intravenous contrast.
CONTRAST:  10 mL Gadavist

[Series 5: T1 · sagittal · 5.0mm · 0.62mm/px · 3 of 22 slices shown (1 of 3)]
[im 1/22]
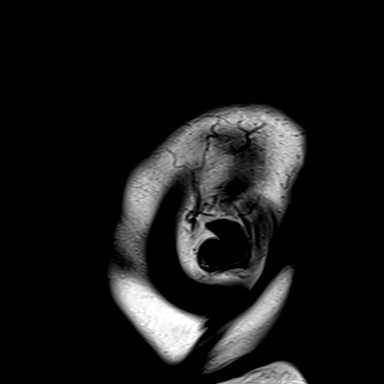
[im 11/22]
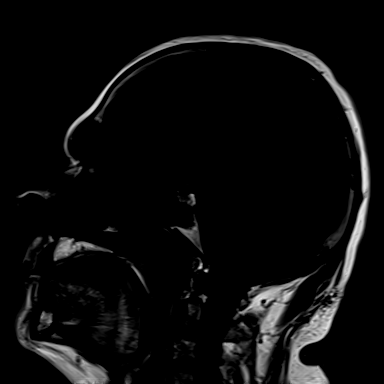
[im 22/22]
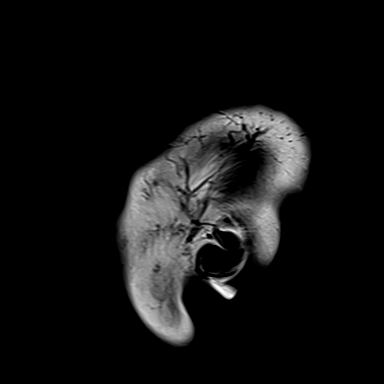

[Series 6: ax dwi_tracew · axial · 3.0mm · 0.73mm/px · z∈[-86,+16]mm · 3 of 52 slices shown]
[im 1/52]
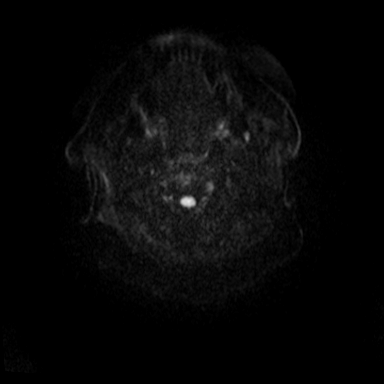
[im 18/52]
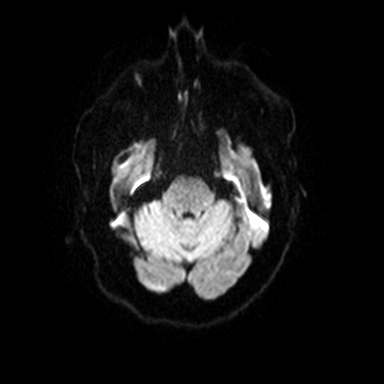
[im 35/52]
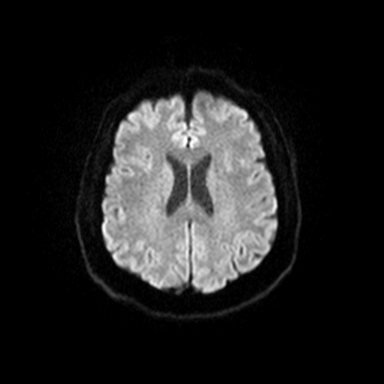

[Series 8: T2 · axial · 5.0mm · 0.53mm/px · z∈[-52,+89]mm · 2 of 25 slices shown]
[im 1/25]
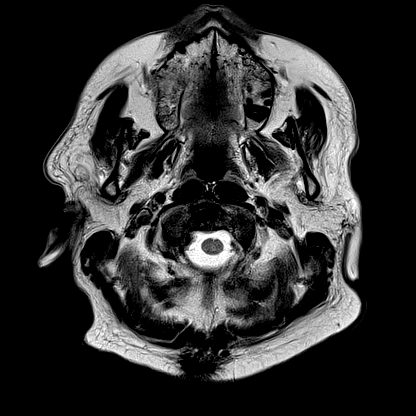
[im 25/25]
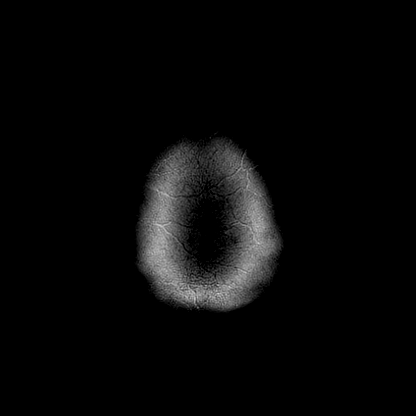

[Series 11: T1 · coronal · non-contrast · 3.0mm · 0.21mm/px · 1 of 13 slices shown (2 of 3)]
[im 1/13]
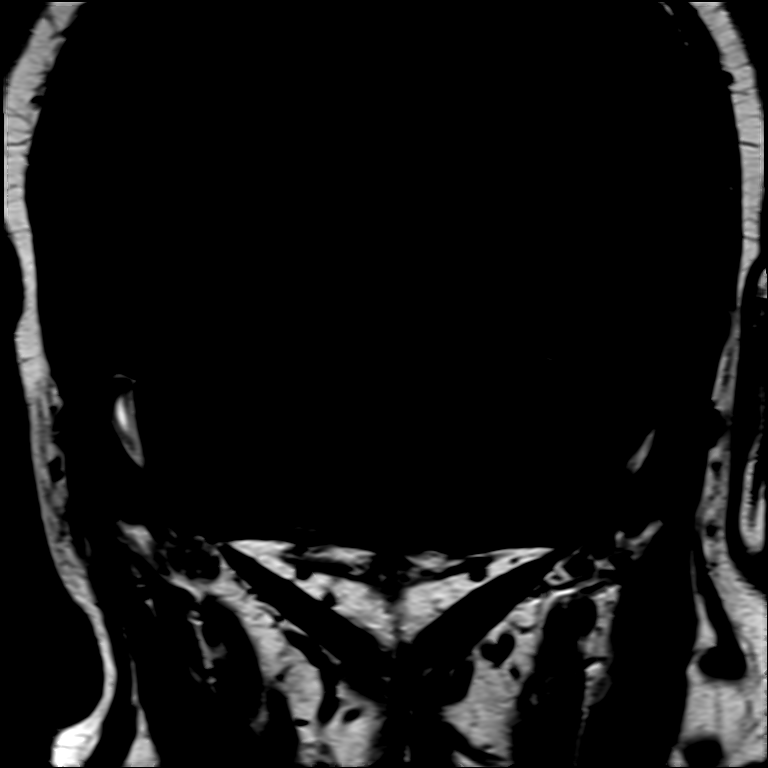

[Series 12: FLAIR · axial · 3.0mm · 0.53mm/px · z∈[-61,+97]mm · 5 of 55 slices shown]
[im 1/55]
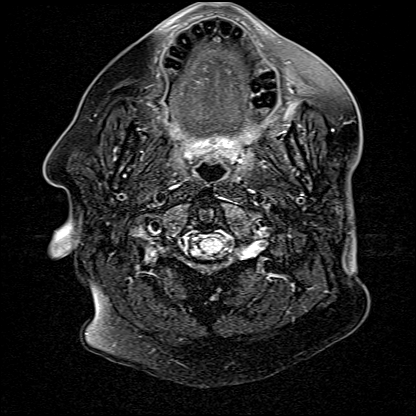
[im 14/55]
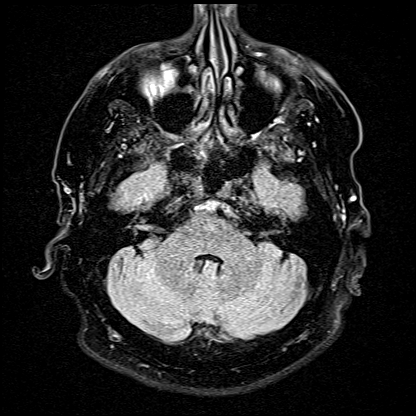
[im 28/55]
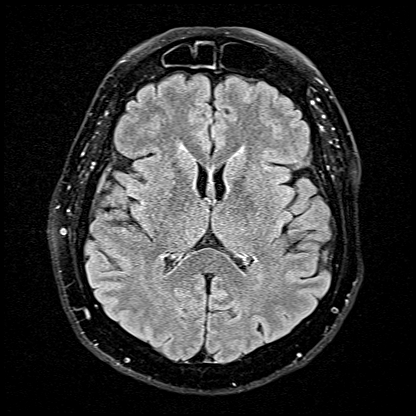
[im 41/55]
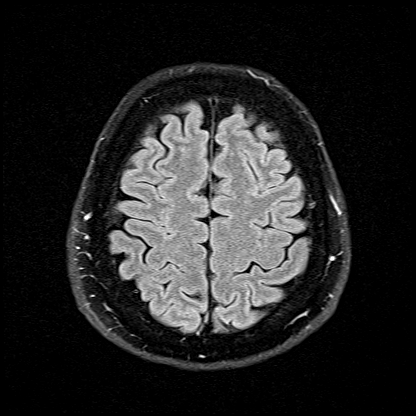
[im 55/55]
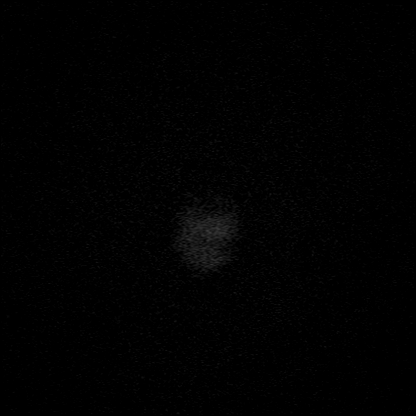

[Series 14: T1 · axial · non-contrast · 3.0mm · 0.21mm/px · 1 of 11 slices shown (3 of 3)]
[im 1/11]
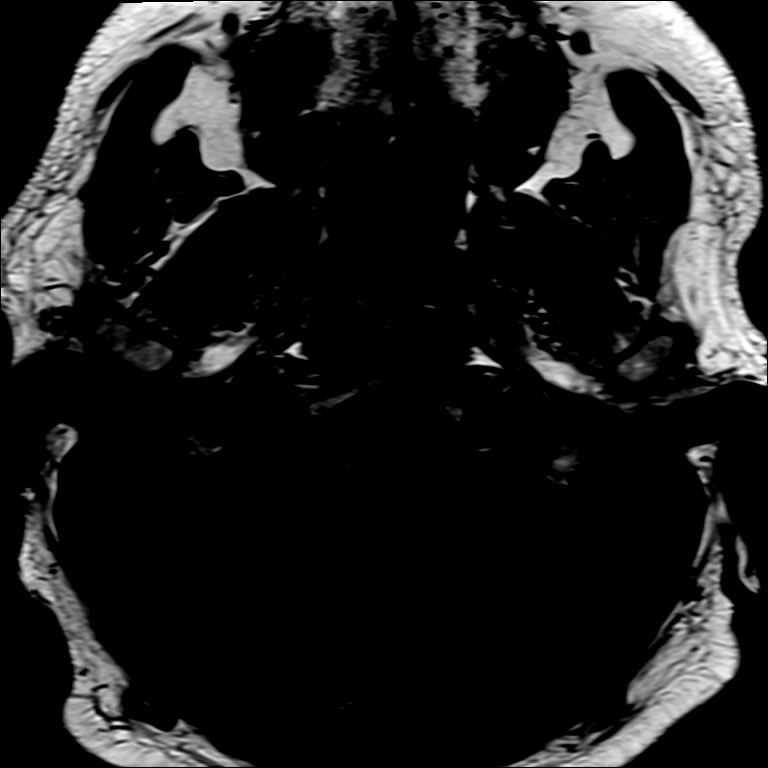

[Series 15: T1 post-contrast · axial · 3.0mm · 0.21mm/px · 1 of 11 slices shown (1 of 3)]
[im 1/11]
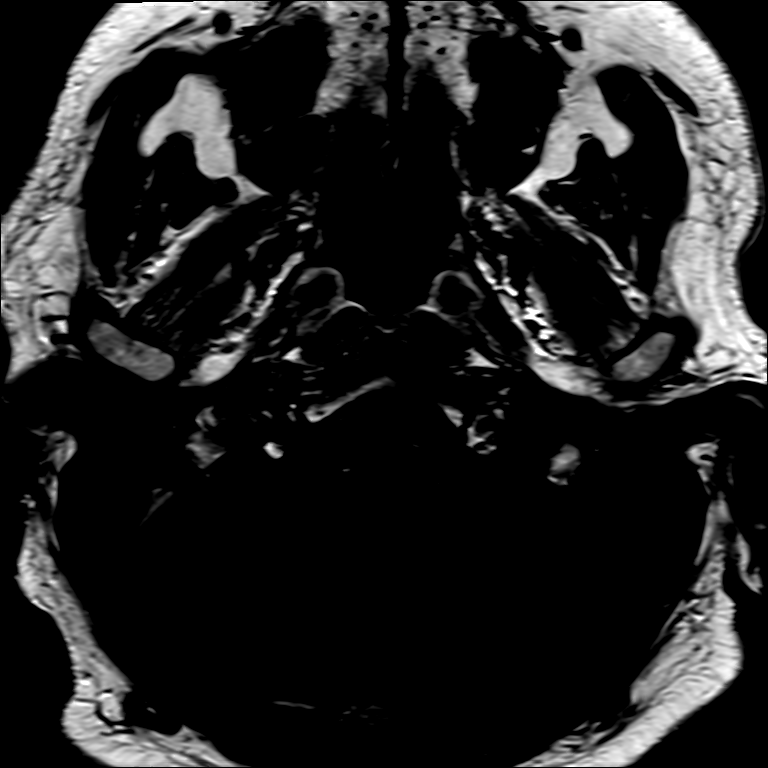

[Series 16: T1 post-contrast · coronal · 3.0mm · 0.21mm/px · 1 of 13 slices shown (2 of 3)]
[im 1/13]
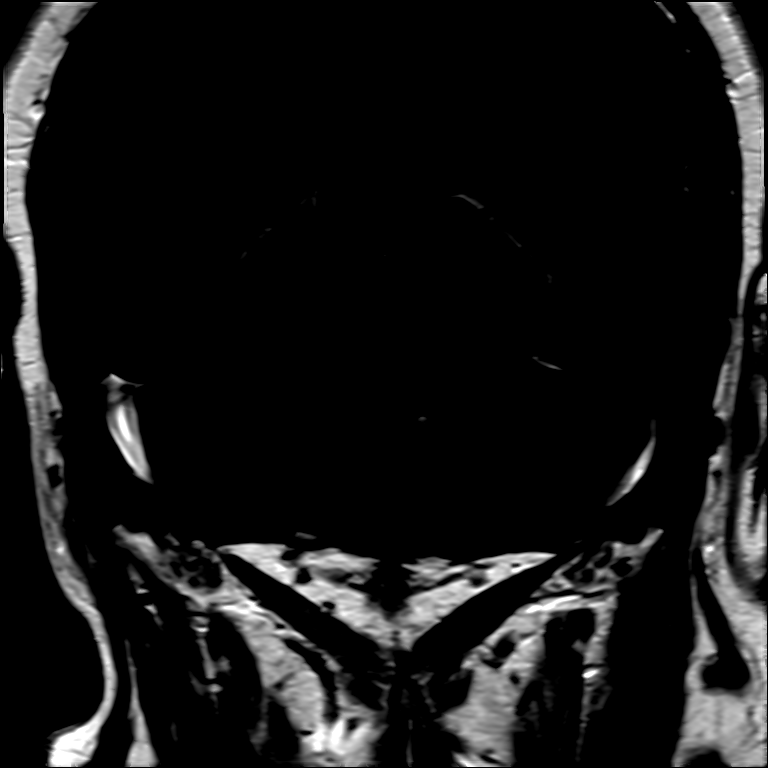

[Series 17: T1 post-contrast · axial · 1.0mm · 0.98mm/px · z∈[-52,+88]mm · 9 of 144 slices shown (3 of 3)]
[im 1/144]
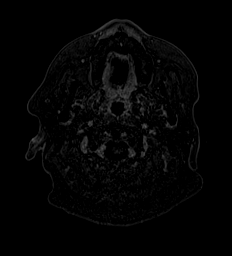
[im 27/144]
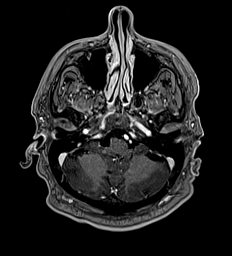
[im 40/144]
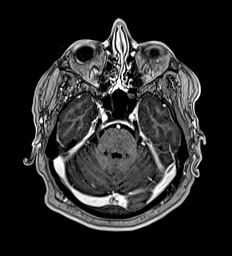
[im 66/144]
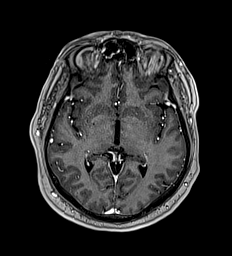
[im 79/144]
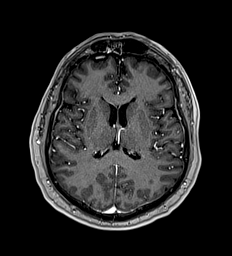
[im 105/144]
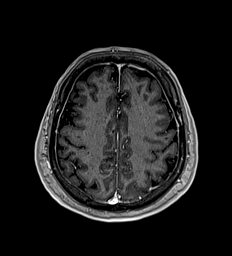
[im 118/144]
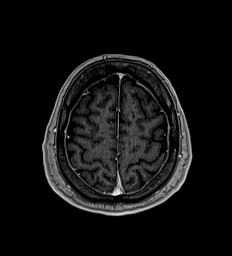
[im 131/144]
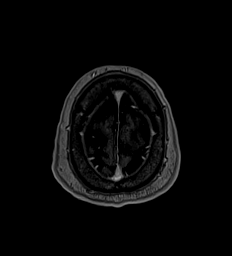
[im 144/144]
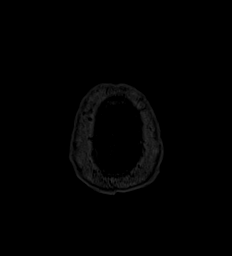

[26 of 48 positions shown; findings below may reference images not displayed]

FINDINGS: Brain: There is no evidence of acute infarct, intracranial
hemorrhage, mass, midline shift, or extra-axial fluid collection.
The ventricles and sulci are normal. A few punctate foci of T2
hyperintensity in the cerebral white matter are unchanged and within
normal limits for age. No abnormal enhancement is identified.

Dedicated imaging through the internal auditory canals demonstrates
a normal course of cranial nerves VII and VIII without evidence of
mass or abnormal enhancement. Inner ear structures demonstrate
normal signal bilaterally. No mass is seen within the
cerebellopontine angles.

Vascular: Major intracranial vascular flow voids are preserved.
Normal enhancement of the dural venous sinuses.

Skull and upper cervical spine: Unremarkable bone marrow signal.

Sinuses/Orbits: Bilateral cataract extraction. Mild scattered
mucosal thickening in the paranasal sinuses. Clear mastoid air
cells.

Other: None.
IMPRESSION: Negative brain and internal auditory canal imaging.

## 2019-02-28 MED ORDER — OXYCODONE HCL 10 MG PO TABS: 10.0000 mg | ORAL_TABLET | Freq: Four times a day (QID) | ORAL | 0 refills | Status: AC

## 2019-02-28 MED ORDER — OXYCODONE HCL 10 MG PO TABS: 10.0000 mg | ORAL_TABLET | Freq: Four times a day (QID) | ORAL | 0 refills | Status: DC

## 2019-03-01 NOTE — Progress Notes (Signed)
Virtual Visit via Video Note  I connected with [name] on 03/01/19 at 10:00 AM EDT by a video enabled telemedicine application and verified that I am speaking with the correct person using two identifiers.  Location: Patient: Home Provider: Pain control center   I discussed the limitations of evaluation and management by telemedicine and the availability of in person appointments. The patient expressed understanding and agreed to proceed.  History of Present Illness: I spoke with him over the phone today via video conferencing.  He has been doing reasonably well with his low back pain but is getting some more left lower extremity calf pain and right hip pain.  In the past has had facet blocks to keep his pain under control and he reports having had epidural steroids that have helped in the past with his calf pain.  Unfortunately this pain is unremitting and he has tried physical therapy exercises and that in combination with the medications have not given him good relief for this.  He is taking his medications as prescribed including the oxycodone.  He gets good relief with this but continues to have the calf pain.  He denies any diverting or illicit use and unfortunately has failed more conservative therapy but gets good relief with his opioid medications.  This is been a stable regimen for him.  He denies side effects with this.  No change in bowel or bladder function is noted.    Observations/Objective:  Current Outpatient Medications:  .  albuterol (PROVENTIL HFA;VENTOLIN HFA) 108 (90 Base) MCG/ACT inhaler, Inhale 2 puffs into the lungs every 6 (six) hours as needed for wheezing or shortness of breath., Disp: , Rfl:  .  albuterol (PROVENTIL) (2.5 MG/3ML) 0.083% nebulizer solution, Take 2.5 mg by nebulization every 6 (six) hours as needed for wheezing or shortness of breath., Disp: , Rfl:  .  aspirin EC 81 MG tablet, Take 1 tablet (81 mg total) by mouth daily., Disp: 90 tablet, Rfl:  3 .  BREO ELLIPTA 200-25 MCG/INH AEPB, INHALE 1 PUFF INTO THE LUNGS DAILY (Patient taking differently: Inhale 1 puff into the lungs daily. ), Disp: 1 each, Rfl: 3 .  cholecalciferol (VITAMIN D3) 25 MCG (1000 UT) tablet, Take 2,000 Units by mouth daily., Disp: , Rfl:  .  clopidogrel (PLAVIX) 75 MG tablet, Take 1 tablet (75 mg total) by mouth daily with breakfast., Disp: 30 tablet, Rfl: 6 .  cyanocobalamin 1000 MCG tablet, Take 1,000 mcg by mouth daily. , Disp: , Rfl:  .  cyclobenzaprine (FEXMID) 7.5 MG tablet, Take 1 tablet (7.5 mg total) by mouth 2 (two) times daily as needed for up to 30 days for muscle spasms., Disp: 60 tablet, Rfl: 3 .  DEXILANT 60 MG capsule, TAKE ONE CAPSULE BY MOUTH DAILY, Disp: 30 capsule, Rfl: 11 .  ezetimibe (ZETIA) 10 MG tablet, Take 1 tablet (10 mg total) by mouth daily., Disp: 90 tablet, Rfl: 3 .  furosemide (LASIX) 20 MG tablet, Take 20 mg by mouth every other day., Disp: , Rfl:  .  gabapentin (NEURONTIN) 300 MG capsule, Take 1 capsule (300 mg total) by mouth 3 (three) times daily., Disp: 90 capsule, Rfl: 1 .  gabapentin (NEURONTIN) 800 MG tablet, Take 800 mg by mouth at bedtime., Disp: , Rfl:  .  insulin lispro (HUMALOG KWIKPEN) 100 UNIT/ML KiwkPen, Inject 7-8 Units into the skin 3 (three) times daily. Reported on 12/02/2015/ sliding scale 1 unit for every 8 units of carbs; and 1 unit for  every 20 above 120, Disp: , Rfl:  .  Insulin Pen Needle (FIFTY50 PEN NEEDLES) 32G X 4 MM MISC, Inject 1 each into the skin as needed., Disp: , Rfl:  .  LANTUS SOLOSTAR 100 UNIT/ML Solostar Pen, Inject 25-30 Units into the skin daily at 10 pm. , Disp: , Rfl: 0 .  levocetirizine (XYZAL) 5 MG tablet, TAKE 1 TABLET(5 MG) BY MOUTH EVERY EVENING, Disp: 30 tablet, Rfl: 3 .  liraglutide (VICTOZA) 18 MG/3ML SOPN, 1.8 units every morning (Patient taking differently: Inject 1.8 mg into the skin daily. ), Disp: , Rfl:  .  loratadine (CLARITIN) 10 MG tablet, Take 10 mg by mouth daily. , Disp: , Rfl:   .  metFORMIN (GLUCOPHAGE) 1000 MG tablet, Take 1,000 mg by mouth 2 (two) times daily with a meal., Disp: , Rfl:  .  metoprolol succinate (TOPROL-XL) 100 MG 24 hr tablet, Take 1 tablet (100 mg total) by mouth daily. Take with or immediately following a meal., Disp: 30 tablet, Rfl: 3 .  montelukast (SINGULAIR) 10 MG tablet, Take 10 mg by mouth at bedtime., Disp: , Rfl:  .  naloxone (NARCAN) nasal spray 4 mg/0.1 mL, For excess sedation from opioids (Patient not taking: Reported on 02/17/2019), Disp: 1 kit, Rfl: 2 .  nitroGLYCERIN (NITROSTAT) 0.3 MG SL tablet, Place 1 tablet (0.3 mg total) under the tongue every 5 (five) minutes as needed for chest pain. Maximum 3 doses., Disp: 25 tablet, Rfl: 3 .  [START ON 03/19/2019] Oxycodone HCl 10 MG TABS, Take 1 tablet (10 mg total) by mouth 4 (four) times daily for 30 days., Disp: 120 tablet, Rfl: 0 .  [START ON 04/18/2019] Oxycodone HCl 10 MG TABS, Take 1 tablet (10 mg total) by mouth 4 (four) times daily for 30 days., Disp: 120 tablet, Rfl: 0 .  rosuvastatin (CRESTOR) 5 MG tablet, Take 5 mg by mouth daily., Disp: , Rfl: 0 .  sacubitril-valsartan (ENTRESTO) 24-26 MG, Take 1 tablet by mouth 2 (two) times daily., Disp: 60 tablet, Rfl: 1   Assessment and Plan: 1. Cervicalgia   2. Facet arthritis of lumbosacral region   3. Chronic pain syndrome   4. Chronic, continuous use of opioids   5. DDD (degenerative disc disease), lumbar   6. Myasthenia gravis ([place])   7. Lyme arthritis of multiple joints ([place])   8. Lumbar spondylosis with myelopathy   9. Sciatica, left side   Based on discussion today I am going to refill his medications for June and July.  I have reviewed the [facility] practitioner database information and it is appropriate.  I will schedule him for return to clinic in 2 months and for an epidural steroid injection at that time to see if this can help with some of the L5 radiculitis that he is experiencing.  He has responded to this favorably in the  past.  Furthermore he has been instructed to continue follow-up with his primary care physicians for his baseline medical care.   Follow Up Instructions: Return to clinic in 2 months    I discussed the assessment and treatment plan with the patient. The patient was provided an opportunity to ask questions and all were answered. The patient agreed with the plan and demonstrated an understanding of the instructions.   The patient was advised to call back or seek an in-person evaluation if the symptoms worsen or if the condition fails to improve as anticipated.  I provided 30 minutes of non-face-to-face time during this encounter.  Molli Barrows, MD

## 2019-03-07 DIAGNOSIS — R278 Other lack of coordination: Secondary | ICD-10-CM | POA: Diagnosis not present

## 2019-03-07 DIAGNOSIS — E1142 Type 2 diabetes mellitus with diabetic polyneuropathy: Secondary | ICD-10-CM | POA: Diagnosis not present

## 2019-03-07 DIAGNOSIS — G7000 Myasthenia gravis without (acute) exacerbation: Secondary | ICD-10-CM | POA: Diagnosis not present

## 2019-03-08 ENCOUNTER — Telehealth: Payer: Self-pay | Admitting: Family Medicine

## 2019-03-08 NOTE — Telephone Encounter (Signed)
@  [place]   Outreach Note  03/08/2019 Name: [name] MRN: [id number] DOB: 1961-03-09  Referred by: [name], MD Reason for referral : Chronic Care Management (Initial CCM outreach call was unsuccessful.)   An unsuccessful telephone outreach was attempted today. The patient was referred to the case management team by for assistance with chronic care management and care coordination.   Follow Up Plan: A HIPPA compliant phone message was left for the patient providing contact information and requesting a return call.  The care management team will reach out to the patient again over the next 7 days.  If patient returns call to provider office, please advise to call [place] at [place]  [email or web address]   ??[phone]

## 2019-03-09 DIAGNOSIS — R278 Other lack of coordination: Secondary | ICD-10-CM | POA: Insufficient documentation

## 2019-03-14 NOTE — Chronic Care Management (AMB) (Signed)
Chronic Care Management   Note  03/09/2019 Name: BRITTEN SEYFRIED MRN: [id number] DOB: Mar 11, 1961  [name] is a 58 y.o. year old male who is a primary care patient of [name], MD. I reached out to [name] by phone today in response to a referral sent by Mr. Kris Hartmann Horan's health plan.    Mr. Cervi was given information about Chronic Care Management services today including:  1. CCM service includes personalized support from designated clinical staff supervised by his physician, including individualized plan of care and coordination with other care providers 2. 24/7 contact phone numbers for assistance for urgent and routine care needs. 3. Service will only be billed when office clinical staff spend 20 minutes or more in a month to coordinate care. 4. Only one practitioner may furnish and bill the service in a calendar month. 5. The patient may stop CCM services at any time (effective at the end of the month) by phone call to the office staff. 6. The patient will be responsible for cost sharing (co-pay) of up to 20% of the service fee (after annual deductible is met).  Patient did not agree to enrollment in care management services and does not wish to consider at this time.  Follow up plan: The patient has been provided with contact information for the chronic care management team and has been advised to call with any health related questions or concerns.    Robinson  [email or web address]   ??[phone]

## 2019-03-16 DIAGNOSIS — J4521 Mild intermittent asthma with (acute) exacerbation: Secondary | ICD-10-CM | POA: Diagnosis not present

## 2019-03-16 DIAGNOSIS — R0600 Dyspnea, unspecified: Secondary | ICD-10-CM | POA: Diagnosis not present

## 2019-03-16 DIAGNOSIS — R05 Cough: Secondary | ICD-10-CM | POA: Diagnosis not present

## 2019-03-16 DIAGNOSIS — G4733 Obstructive sleep apnea (adult) (pediatric): Secondary | ICD-10-CM | POA: Diagnosis not present

## 2019-03-17 DIAGNOSIS — Z794 Long term (current) use of insulin: Secondary | ICD-10-CM | POA: Diagnosis not present

## 2019-03-17 DIAGNOSIS — E1165 Type 2 diabetes mellitus with hyperglycemia: Secondary | ICD-10-CM | POA: Diagnosis not present

## 2019-03-18 ENCOUNTER — Other Ambulatory Visit: Payer: Self-pay | Admitting: Nurse Practitioner

## 2019-03-18 DIAGNOSIS — J4541 Moderate persistent asthma with (acute) exacerbation: Secondary | ICD-10-CM

## 2019-03-20 ENCOUNTER — Telehealth: Payer: Self-pay | Admitting: *Deleted

## 2019-03-20 NOTE — Telephone Encounter (Signed)
Dx code called to [facility]

## 2019-03-22 ENCOUNTER — Encounter: Payer: Self-pay | Admitting: Nurse Practitioner

## 2019-03-22 ENCOUNTER — Telehealth (INDEPENDENT_AMBULATORY_CARE_PROVIDER_SITE_OTHER): Payer: Medicare Other | Admitting: Nurse Practitioner

## 2019-03-22 ENCOUNTER — Other Ambulatory Visit: Payer: Self-pay

## 2019-03-22 VITALS — BP 93/66 | HR 93 | Ht 67.0 in | Wt 232.0 lb

## 2019-03-22 DIAGNOSIS — I25118 Atherosclerotic heart disease of native coronary artery with other forms of angina pectoris: Secondary | ICD-10-CM | POA: Diagnosis not present

## 2019-03-22 DIAGNOSIS — I10 Essential (primary) hypertension: Secondary | ICD-10-CM | POA: Diagnosis not present

## 2019-03-22 DIAGNOSIS — I5022 Chronic systolic (congestive) heart failure: Secondary | ICD-10-CM | POA: Diagnosis not present

## 2019-03-22 DIAGNOSIS — I255 Ischemic cardiomyopathy: Secondary | ICD-10-CM | POA: Diagnosis not present

## 2019-03-22 DIAGNOSIS — E785 Hyperlipidemia, unspecified: Secondary | ICD-10-CM

## 2019-03-22 NOTE — Progress Notes (Signed)
Virtual Visit via Video Note   This visit type was conducted due to national recommendations for restrictions regarding the COVID-19 Pandemic (e.g. social distancing) in an effort to limit this patient's exposure and mitigate transmission in our community.  Due to his co-morbid illnesses, this patient is at least at moderate risk for complications without adequate follow up.  This format is felt to be most appropriate for this patient at this time.  All issues noted in this document were discussed and addressed.  A limited physical exam was performed with this format.  Please refer to the patient's chart for his consent to telehealth for [facility]. Evaluation Performed:  Follow-up visit  This visit type was conducted due to national recommendations for restrictions regarding the COVID-19 Pandemic (e.g. social distancing).  This format is felt to be most appropriate for this patient at this time.  All issues noted in this document were discussed and addressed.  No physical exam was performed (except for noted visual exam findings with Video Visits).  Please refer to the patient's chart (MyChart message for video visits and phone note for telephone visits) for the patient's consent to telehealth for [facility]. _____________   Date:  03/22/2019   Patient ID:  [name], DOB 1961/04/03, MRN [id number] Patient Location:  Home Provider location:   Office  Primary Care Provider:  [name], MD Primary Cardiologist:  [name], MD  Chief Complaint    58 year old male with a history of CAD status post multiple PCI's, hypertension, hyperlipidemia, type 2 diabetes mellitus, diabetic neuropathy, myasthenia gravis, sepsis, and obstructive sleep apnea, who presents for follow-up related to recurrent angina.  Past Medical History    Past Medical History:  Diagnosis Date   Allergy    dust, seasonal (worse in the fall).   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 Followed by Pain  Specialist in [place], back and neck   Asthma    BRONCHITIS   Cataract    First Dx in 2012   Chronic combined systolic and diastolic congestive heart failure ([place])    a. 03/2018 Echo: EF 30-35%, ant, antlat, apical AK, Gr1 DD; b. 07/2018 Echo: EF 35-40%, anteroseptal, apical, and ant HK. Gr1 DD; c. 09/2019 TEE: EF 40-45%.   Coronary artery disease    a. Prior Ant MI->s/p multiple stents placed in the LAD and right coronary artery ([place]); b. 2016 Cath: reportedly nonobs dzs;  c. 04/2018 Cath/PCI: LM nl, LAD 20p, patent mid stent, LCX 55m(3.25x15 [place] DES), OM1 nl, OM2 50, OM3 40 w/ patent stent, RCA 40p, 28m, 40d w/ patent stent in RPDA, RPAV 60, EF 25-35%. 2+MR; d. 02/2019 MV: Apical scar, no isch, EF 30-44%.   Diabetes mellitus without complication ([place])    TYPE 2   Diabetic peripheral neuropathy (HCC)    feet and hands   FUO (fever of unknown origin) 08/03/2018   GERD (gastroesophageal reflux disease)    Headache    muscle tension   Hyperlipidemia    Hypertension    CONTROLLED ON MEDS   Insomnia    Ischemic cardiomyopathy    a. 03/2018 Echo: EF 30-35%, ant, antlat, apical AK, Gr1 DD, mild MR, mildly dil LA; b. 07/2018 Echo: EF 35-40%; c. 09/2018 TEE: EF 40-45%, antsept, ant HK.   Lyme disease    Chronic   Myasthenia gravis ([place])    Myocardial infarction ([place]) 2010   Seasonal allergies    Sepsis ([place])    a.07/2018 - unknown source. TEE  neg for veg 09/2019.   Sleep apnea    CPAP   Past Surgical History:  Procedure Laterality Date   BILATERAL CARPAL TUNNEL RELEASE Bilateral L in 2012 and R in 2013   [place], most recent in  March 2016.   COLONOSCOPY WITH PROPOFOL N/A 01/10/2016   Procedure: COLONOSCOPY WITH PROPOFOL;  Surgeon: [name], MD;  Location: [facility] ENDOSCOPY;  Service: Endoscopy;  Laterality: N/A;   CORONARY ANGIOPLASTY     CORONARY STENT INTERVENTION N/A 04/25/2018   Procedure: CORONARY STENT INTERVENTION;   Surgeon: [name], MD;  Location: [facility];  Service: Cardiovascular;  Laterality: N/A;   ESOPHAGOGASTRODUODENOSCOPY (EGD) WITH PROPOFOL N/A 01/10/2016   Procedure: ESOPHAGOGASTRODUODENOSCOPY (EGD) WITH PROPOFOL;  Surgeon: [name], MD;  Location: [facility] ENDOSCOPY;  Service: Endoscopy;  Laterality: N/A;   EYE SURGERY Bilateral 2012   cataract/bilateral vitrectomies   LEFT HEART CATH AND CORONARY ANGIOGRAPHY Left 04/25/2018   Procedure: LEFT HEART CATH AND CORONARY ANGIOGRAPHY;  Surgeon: [name], MD;  Location: [facility];  Service: Cardiovascular;  Laterality: Left;   TEE WITHOUT CARDIOVERSION N/A 09/05/2018   Procedure: TRANSESOPHAGEAL ECHOCARDIOGRAM (TEE);  Surgeon: [name], MD;  Location: [facility] ORS;  Service: Cardiovascular;  Laterality: N/A;   TONSILLECTOMY AND ADENOIDECTOMY     As a child   TUNNELED VENOUS CATHETER PLACEMENT     removed    Allergies  Allergies  Allergen Reactions   Azathioprine Other (See Comments)    Azathioprine hypersensitivity reaction   Metoprolol Rash   Novolog [Insulin Aspart] Hives    History of Present Illness    [name] is a 58 y.o. male who presents via audio/video conferencing for a telehealth visit today.  [name] is a 58 y.o. male who presents via audio/video conferencing for a telehealth visit today.  As above, he has a history of CAD status post prior myocardial infarction in 2009 with subsequent placement of a total of 10 stents between the LAD and right coronary artery in [place].  Other history includes hypertension, hyperlipidemia, type 2 diabetes mellitus, sinus tachycardia, diabetic neuropathy, myasthenia gravis, GERD, sleep apnea on CPAP, sepsis, and ischemic cardiomyopathy.  In June 2019, he had worsening dyspnea on exertion and nitrate responsive angina at rest.  An echocardiogram showed an EF of 30 to 35%.  Catheterization showed a new 90% stenosis in the mid left  circumflex with multiple patent LAD, RCA, and OM 2 stents.  The circumflex was successfully treated with a drug-eluting stent and he was placed on guideline directed therapy for cardiomyopathy.  In October 2019, he was admitted with disorientation, fever, and vomiting.  He was treated for sepsis and during that hospitalization, troponin rose to 11.48.  Echocardiogram showed persistent LV dysfunction with an EF of 30 to 35%.  Catheterization was deferred in the setting of sepsis.  He was readmitted in mid October with recurrent fevers and chills.  MRIs of the brain and spine did not show any source of infection while TEE in 09/2018 was negative for vegetation (EF 40-45% @ that time).  I saw him via telemedicine visit on May 7, at which time he reported a 2-week history of increasing dyspnea on exertion as well as more frequent episodes of nitrate responsive exertional chest discomfort.  Stress testing was undertaken and was intermediate, showing multiple areas of apical infarct without new ischemia.  He was seen by Dr. Fletcher Anon via virtual visit  on May 15, with ongoing symptoms.  Carvedilol switched to metoprolol in an effort to reduce his heart rate and hopefully treat symptoms.  Since then, he has had an improvement in symptoms.  In addition to a change in beta-blocker, he has been seen by pulmonology and has been receiving a steroid taper due to upper respiratory congestion/asthma.  He believes his symptoms have now returned to baseline, requiring nitrates 2-3 times per week for chest discomfort, which she has for many years.  His dyspnea has improved significantly.  He denies palpitations, PND, orthopnea, dizziness, syncope, edema, or early satiety.  He continues to do his best to maintain social distancing and wear a mask when in public.  He is walking his dog twice a day without significant limitations.  The patient does not have symptoms concerning for COVID-19 infection (fever, chills, cough, or new  shortness of breath).   Home Medications    Prior to Admission medications   Medication Sig Start Date End Date Taking? Authorizing Provider  albuterol (PROVENTIL HFA;VENTOLIN HFA) 108 (90 Base) MCG/ACT inhaler Inhale 2 puffs into the lungs every 6 (six) hours as needed for wheezing or shortness of breath.   Yes [provider]  albuterol (PROVENTIL) (2.5 MG/3ML) 0.083% nebulizer solution Take 2.5 mg by nebulization every 6 (six) hours as needed for wheezing or shortness of breath.   Yes [provider]  aspirin EC 81 MG tablet Take 1 tablet (81 mg total) by mouth daily. 03/28/18  Yes Theora Gianotti, NP  cholecalciferol (VITAMIN D3) 25 MCG (1000 UT) tablet Take 2,000 Units by mouth daily.   Yes [provider]  clopidogrel (PLAVIX) 75 MG tablet Take 1 tablet (75 mg total) by mouth daily with breakfast. 12/14/18  Yes Theora Gianotti, NP  cyanocobalamin 1000 MCG tablet Take 1,000 mcg by mouth daily.    Yes [provider]  DEXILANT 60 MG capsule TAKE ONE CAPSULE BY MOUTH DAILY 02/02/19  Yes [name], MD  ezetimibe (ZETIA) 10 MG tablet Take 1 tablet (10 mg total) by mouth daily. 04/11/18 02/09/28 Yes Theora Gianotti, NP  fluticasone furoate-vilanterol (BREO ELLIPTA) 200-25 MCG/INH AEPB Inhale 1 puff into the lungs daily. 03/20/19  Yes Poulose, Bethel Born, NP  furosemide (LASIX) 20 MG tablet Take 20 mg by mouth every other day.   Yes [provider]  gabapentin (NEURONTIN) 300 MG capsule Take 1 capsule (300 mg total) by mouth 3 (three) times daily. 09/25/15  Yes Roselee Nova, MD  gabapentin (NEURONTIN) 800 MG tablet Take 800 mg by mouth at bedtime.   Yes [provider]  insulin lispro (HUMALOG KWIKPEN) 100 UNIT/ML KiwkPen Inject 7-8 Units into the skin 3 (three) times daily. Reported on 12/02/2015/ sliding scale 1 unit for every 8 units of carbs; and 1 unit for every 20 above 120   Yes [provider]    Insulin Pen Needle (FIFTY50 PEN NEEDLES) 32G X 4 MM MISC Inject 1 each into the skin as needed. 02/24/18  Yes [provider]  LANTUS SOLOSTAR 100 UNIT/ML Solostar Pen Inject 25-30 Units into the skin daily at 10 pm.  03/25/16  Yes Sherri Sear, MD  levocetirizine (XYZAL) 5 MG tablet TAKE 1 TABLET(5 MG) BY MOUTH EVERY EVENING 02/20/19  Yes Poulose, Bethel Born, NP  liraglutide (VICTOZA) 18 MG/3ML SOPN 1.8 units every morning Patient taking differently: Inject 1.8 mg into the skin daily.  04/26/18  Yes Theora Gianotti, NP  loratadine Shearon Balo)  10 MG tablet Take 10 mg by mouth daily.    Yes [provider]  metFORMIN (GLUCOPHAGE) 1000 MG tablet Take 1,000 mg by mouth 2 (two) times daily with a meal.   Yes Sherri Sear, MD  metoprolol succinate (TOPROL-XL) 100 MG 24 hr tablet Take 1 tablet (100 mg total) by mouth daily. Take with or immediately following a meal. 02/17/19 05/18/19 Yes Arida, Mertie Clause, MD  montelukast (SINGULAIR) 10 MG tablet Take 10 mg by mouth at bedtime. 12/02/18  Yes [provider]  naloxone [facility]) nasal spray 4 mg/0.1 mL For excess sedation from opioids 09/15/16  Yes Molli Barrows, MD  nitroGLYCERIN (NITROSTAT) 0.3 MG SL tablet Place 1 tablet (0.3 mg total) under the tongue every 5 (five) minutes as needed for chest pain. Maximum 3 doses. 08/18/18  Yes Theora Gianotti, NP  Oxycodone HCl 10 MG TABS Take 1 tablet (10 mg total) by mouth 4 (four) times daily for 30 days. 03/19/19 04/18/19 Yes Molli Barrows, MD  Oxycodone HCl 10 MG TABS Take 1 tablet (10 mg total) by mouth 4 (four) times daily for 30 days. 04/18/19 05/18/19 Yes Molli Barrows, MD  rosuvastatin (CRESTOR) 5 MG tablet Take 5 mg by mouth daily. 11/20/17  Yes [name], MD  sacubitril-valsartan (ENTRESTO) 24-26 MG Take 1 tablet by mouth 2 (two) times daily. 02/01/19  Yes Theora Gianotti, NP    Review of Systems    Frequency of nitrate responsive  chest pain has reduced significantly down to 2-3 times per week, which is what he is more accustomed to.  Breathing has improved significantly with steroid taper.  He denies palpitations, PND, orthopnea, dizziness, syncope, edema, or early satiety.  All other systems reviewed and are otherwise negative except as noted above.  Physical Exam    Vital Signs:  BP 93/66 (BP Location: Left Arm, Patient Position: Sitting, Cuff Size: Normal)    Pulse 93    Ht 5\' 7"  (1.702 m)    Wt 232 lb (105.2 kg)    BMI 36.34 kg/m    Well nourished, well developed male in no acute distress.  Awake alert and oriented x3.  HEENT is grossly normal.  Respirations are regular and unlabored.  Accessory Clinical Findings    Recent stress testing reviewed with patient today.  Assessment & Plan    1.  Coronary artery disease with stable angina: The past month, he has noted improvement in anginal symptoms.  Is a long history of requiring nitrates and he now believes he is back to his previous baseline utilization of 2-3 nitroglycerin tablets a week.  He thinks the change in beta-blocker has helped and he has noted resting heart rates in the 90s, which was previously unusual for him.  He has not experienced any rash, which was previously noted with metoprolol in the past.  Breathing has improved in the setting of recent steroid taper.  Given improvement, we will defer any additional ischemic evaluation at this time.  He remains on aspirin, Plavix, Zetia, beta-blocker, and statin therapy.  2.  HFrEF/ischemic cardiomyopathy: EF 40 to 45% by transesophageal echo in December 2019.  Weight has been stable and dyspnea improved.  He remains on beta-blocker, Entresto, and Lasix therapy.  Blood pressure remains soft and limits titration or addition of spironolactone (he was also mildly hyperkalemic on recent evaluation).  3.  Essential hypertension: If anything, he runs soft.  Pressures have been in the 90s since changing to metoprolol.  He is asymptomatic.  No changes today.  4.  Hyperlipidemia: Tolerating rosuvastatin and Zetia.  LDL 50 in August 2019.  5.  Type 2 diabetes mellitus: A1c 7.8 and he is followed closely by primary care.  Consider SGLT2 inhibitor therapy in the future.  6.  Disposition: Follow-up with Dr. Fletcher Anon in 3 months or sooner if necessary.  COVID-19 Education: The signs and symptoms of COVID-19 were discussed with the patient and how to seek care for testing (follow up with PCP or arrange E-visit).  The importance of social distancing was discussed today.  Patient Risk:   After full review of this patient's history and clinical status, I feel that he is at least moderate risk for cardiac complications at this time, thus necessitating a telehealth visit sooner than our first available in office visit.  Time:   Today, I have spent 15 minutes with the patient with telehealth technology discussing medical history, symptoms, and management plan.     Murray Hodgkins, NP 03/22/2019, 12:32 PM

## 2019-03-22 NOTE — Patient Instructions (Signed)
It was a pleasure to speak with you on the phone today! Thank you for allowing [place] to continue taking care of your [facility] needs during this time.   Feel free to call as needed for questions and concerns related to your cardiac needs.   Medication Instructions:  Your physician recommends that you continue on your current medications as directed. Please refer to the Current Medication list given to you today.  If you need a refill on your cardiac medications before your next appointment, please call your pharmacy.   Lab work: None ordered  If you have labs (blood work) drawn today and your tests are completely normal, you will receive your results only by: [name] MyChart Message (if you have MyChart) OR . A paper copy in the mail If you have any lab test that is abnormal or we need to change your treatment, we will call you to review the results.  Testing/Procedures: None ordered   Follow-Up: At [facility], you and your health needs are our priority.  As part of our continuing mission to provide you with exceptional heart care, we have created designated Provider Care Teams.  These Care Teams include your primary Cardiologist (physician) and Advanced Practice Providers (APPs -  Physician Assistants and Nurse Practitioners) who all work together to provide you with the care you need, when you need it. You will need a follow up appointment in 3 months. You may see [name], MD or Murray Hodgkins, NP.

## 2019-04-10 ENCOUNTER — Telehealth: Payer: Self-pay

## 2019-04-10 MED ORDER — SACUBITRIL-VALSARTAN 24-26 MG PO TABS: 1.0000 | ORAL_TABLET | Freq: Two times a day (BID) | ORAL | 1 refills | Status: DC

## 2019-04-10 NOTE — Telephone Encounter (Signed)
Requested Prescriptions   Signed Prescriptions Disp Refills  . sacubitril-valsartan (ENTRESTO) 24-26 MG 60 tablet 1    Sig: Take 1 tablet by mouth 2 (two) times daily.    Authorizing Provider: Theora Gianotti    Ordering User: Raelene Bott, BRANDY L

## 2019-04-13 ENCOUNTER — Telehealth: Payer: Self-pay | Admitting: Cardiovascular Disease

## 2019-04-13 NOTE — Telephone Encounter (Signed)
Spoke with the pt. Pt sts that he will be scheduled for a lumbar injection 8/19 and will need to hold Plavix for 10 days. Adv the pt to have the physicians office that is performing the procedure to fwd the rqst to [place] in writing. Pt verbalized understanding and will contact the physician's office.

## 2019-04-13 NOTE — Telephone Encounter (Signed)
Patient calling States that he will need back surgery and needs to know what to do with his medications Informed him that the surgeon will need to fax over a clearance form  Patient would still like to speak with nurse Please call

## 2019-04-15 DIAGNOSIS — E1165 Type 2 diabetes mellitus with hyperglycemia: Secondary | ICD-10-CM | POA: Diagnosis not present

## 2019-04-15 DIAGNOSIS — Z794 Long term (current) use of insulin: Secondary | ICD-10-CM | POA: Diagnosis not present

## 2019-04-21 ENCOUNTER — Encounter: Payer: Self-pay | Admitting: Family Medicine

## 2019-04-21 DIAGNOSIS — G609 Hereditary and idiopathic neuropathy, unspecified: Secondary | ICD-10-CM | POA: Insufficient documentation

## 2019-04-24 ENCOUNTER — Other Ambulatory Visit: Payer: Self-pay | Admitting: *Deleted

## 2019-04-24 ENCOUNTER — Telehealth: Payer: Self-pay | Admitting: Anesthesiology

## 2019-04-24 NOTE — Telephone Encounter (Signed)
Patient called to verify appt. He has procedure appt coming in August. He needs request sent to Dr. Fletcher Anon stating why he needs to stop blood thinner. Please let him know when this has been done and what day he needs to stop his med.  Appt is 05-24-19

## 2019-04-24 NOTE — Telephone Encounter (Signed)
Request for clearance to stop Plavix sent to Dr. Fletcher Anon. Patient notified per voicemail.

## 2019-04-25 ENCOUNTER — Other Ambulatory Visit: Payer: Self-pay

## 2019-04-25 ENCOUNTER — Encounter: Payer: Self-pay | Admitting: Anesthesiology

## 2019-04-25 ENCOUNTER — Ambulatory Visit: Payer: Medicare Other | Attending: Anesthesiology | Admitting: Anesthesiology

## 2019-04-25 ENCOUNTER — Telehealth: Payer: Self-pay | Admitting: Cardiovascular Disease

## 2019-04-25 DIAGNOSIS — G894 Chronic pain syndrome: Secondary | ICD-10-CM | POA: Diagnosis not present

## 2019-04-25 DIAGNOSIS — M47817 Spondylosis without myelopathy or radiculopathy, lumbosacral region: Secondary | ICD-10-CM

## 2019-04-25 DIAGNOSIS — M542 Cervicalgia: Secondary | ICD-10-CM | POA: Diagnosis not present

## 2019-04-25 DIAGNOSIS — M4716 Other spondylosis with myelopathy, lumbar region: Secondary | ICD-10-CM

## 2019-04-25 DIAGNOSIS — F1190 Opioid use, unspecified, uncomplicated: Secondary | ICD-10-CM

## 2019-04-25 DIAGNOSIS — G7000 Myasthenia gravis without (acute) exacerbation: Secondary | ICD-10-CM

## 2019-04-25 DIAGNOSIS — M5432 Sciatica, left side: Secondary | ICD-10-CM

## 2019-04-25 DIAGNOSIS — A6923 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: Secondary | ICD-10-CM

## 2019-04-25 DIAGNOSIS — M5136 Other intervertebral disc degeneration, lumbar region: Secondary | ICD-10-CM

## 2019-04-25 MED ORDER — OXYCODONE HCL 10 MG PO TABS: 10.0000 mg | ORAL_TABLET | Freq: Four times a day (QID) | ORAL | 0 refills | Status: AC

## 2019-04-25 MED ORDER — CYCLOBENZAPRINE HCL 10 MG PO TABS: 10.0000 mg | ORAL_TABLET | Freq: Two times a day (BID) | ORAL | 2 refills | Status: DC

## 2019-04-25 NOTE — Telephone Encounter (Signed)
Dr. Fletcher Anon  Can you please comment on this patients DAPT? He is to undergo an epidural back injection for chronic pain. He has a long hx of CAD with multiple stents in several vessels. Last cath 04/2018 with stent to mLCx with recommendations for long term DAPT with Plavix and ASA. Also has a hx of HTN, HLD, DM2, chronic combined HF.   Please route your response to [facility] pre-op pool.   Thank you  Sharee Pimple

## 2019-04-25 NOTE — Progress Notes (Signed)
Virtual Visit via Video Note  I connected with [name] on 04/25/19 at  3:00 PM EDT by a video enabled telemedicine application and verified that I am speaking with the correct person using two identifiers.  Location: Patient: Home Provider: Pain control center   I discussed the limitations of evaluation and management by telemedicine and the availability of in person appointments. The patient expressed understanding and agreed to proceed.  History of Present Illness: I spoke with him regarding his low back pain and leg pain.  This is via video voice conferencing and he has been continued to have left calf cramping and left buttock pain.  This is similar to what he presented with on his last visit.  He still taking his medications the oxycodone 10 mg tablets 4 times a day for his chronic pain syndrome and this is working well for him.  Based on our discussion he continues to derive good functional lifestyle improvement with his medicines and no side effects are reported.  He has required cardiac clearance for coming off of the Plavix in advance of the epidural steroid which is currently scheduled for August.  Otherwise he is in his usual state of health with no new contributory changes today.    Observations/Objective:  Current Outpatient Medications:  .  albuterol (PROVENTIL HFA;VENTOLIN HFA) 108 (90 Base) MCG/ACT inhaler, Inhale 2 puffs into the lungs every 6 (six) hours as needed for wheezing or shortness of breath., Disp: , Rfl:  .  albuterol (PROVENTIL) (2.5 MG/3ML) 0.083% nebulizer solution, Take 2.5 mg by nebulization every 6 (six) hours as needed for wheezing or shortness of breath., Disp: , Rfl:  .  aspirin EC 81 MG tablet, Take 1 tablet (81 mg total) by mouth daily., Disp: 90 tablet, Rfl: 3 .  cholecalciferol (VITAMIN D3) 25 MCG (1000 UT) tablet, Take 2,000 Units by mouth daily., Disp: , Rfl:  .  clopidogrel (PLAVIX) 75 MG tablet, Take 1 tablet (75 mg total) by mouth daily with  breakfast., Disp: 30 tablet, Rfl: 6 .  cyanocobalamin 1000 MCG tablet, Take 1,000 mcg by mouth daily. , Disp: , Rfl:  .  cyclobenzaprine (FLEXERIL) 10 MG tablet, Take 1 tablet (10 mg total) by mouth 2 (two) times daily., Disp: 60 tablet, Rfl: 2 .  DEXILANT 60 MG capsule, TAKE ONE CAPSULE BY MOUTH DAILY, Disp: 30 capsule, Rfl: 11 .  ezetimibe (ZETIA) 10 MG tablet, Take 1 tablet (10 mg total) by mouth daily., Disp: 90 tablet, Rfl: 3 .  fluticasone furoate-vilanterol (BREO ELLIPTA) 200-25 MCG/INH AEPB, Inhale 1 puff into the lungs daily., Disp: 3 each, Rfl: 1 .  furosemide (LASIX) 20 MG tablet, Take 20 mg by mouth every other day., Disp: , Rfl:  .  gabapentin (NEURONTIN) 300 MG capsule, Take 1 capsule (300 mg total) by mouth 3 (three) times daily., Disp: 90 capsule, Rfl: 1 .  gabapentin (NEURONTIN) 800 MG tablet, Take 800 mg by mouth at bedtime., Disp: , Rfl:  .  insulin lispro (HUMALOG KWIKPEN) 100 UNIT/ML KiwkPen, Inject 7-8 Units into the skin 3 (three) times daily. Reported on 12/02/2015/ sliding scale 1 unit for every 8 units of carbs; and 1 unit for every 20 above 120, Disp: , Rfl:  .  Insulin Pen Needle (FIFTY50 PEN NEEDLES) 32G X 4 MM MISC, Inject 1 each into the skin as needed., Disp: , Rfl:  .  LANTUS SOLOSTAR 100 UNIT/ML Solostar Pen, Inject 25-30 Units into the skin daily at 10 pm. , Disp: ,  Rfl: 0 .  levocetirizine (XYZAL) 5 MG tablet, TAKE 1 TABLET(5 MG) BY MOUTH EVERY EVENING, Disp: 30 tablet, Rfl: 3 .  liraglutide (VICTOZA) 18 MG/3ML SOPN, 1.8 units every morning (Patient taking differently: Inject 1.8 mg into the skin daily. ), Disp: , Rfl:  .  loratadine (CLARITIN) 10 MG tablet, Take 10 mg by mouth daily. , Disp: , Rfl:  .  metFORMIN (GLUCOPHAGE) 1000 MG tablet, Take 1,000 mg by mouth 2 (two) times daily with a meal., Disp: , Rfl:  .  metoprolol succinate (TOPROL-XL) 100 MG 24 hr tablet, Take 1 tablet (100 mg total) by mouth daily. Take with or immediately following a meal., Disp: 30  tablet, Rfl: 3 .  montelukast (SINGULAIR) 10 MG tablet, Take 10 mg by mouth at bedtime., Disp: , Rfl:  .  naloxone (NARCAN) nasal spray 4 mg/0.1 mL, For excess sedation from opioids, Disp: 1 kit, Rfl: 2 .  nitroGLYCERIN (NITROSTAT) 0.3 MG SL tablet, Place 1 tablet (0.3 mg total) under the tongue every 5 (five) minutes as needed for chest pain. Maximum 3 doses., Disp: 25 tablet, Rfl: 3 .  [START ON 05/19/2019] Oxycodone HCl 10 MG TABS, Take 1 tablet (10 mg total) by mouth 4 (four) times daily., Disp: 120 tablet, Rfl: 0 .  [START ON 06/18/2019] Oxycodone HCl 10 MG TABS, Take 1 tablet (10 mg total) by mouth 4 (four) times daily., Disp: 120 tablet, Rfl: 0 .  rosuvastatin (CRESTOR) 5 MG tablet, Take 5 mg by mouth daily., Disp: , Rfl: 0 .  sacubitril-valsartan (ENTRESTO) 24-26 MG, Take 1 tablet by mouth 2 (two) times daily., Disp: 60 tablet, Rfl: 1  Assessment and Plan: 1. Cervicalgia   2. Facet arthritis of lumbosacral region   3. Chronic pain syndrome   4. Chronic, continuous use of opioids   5. DDD (degenerative disc disease), lumbar   6. Myasthenia gravis ([place])   7. 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 Lumbar spondylosis with myelopathy   9. Sciatica, left side   Based on her review today and upon review of the [facility] practitioner database information I am going to schedule him for in August epidural which is actually on the counter already.  He does require withdrawal from the Plavix which has been done by his cardiologist.  I am also calling in his prescriptions for his medications today this will be for August 14 and September 13.  He is to continue with stretching strengthening exercises and also refill his Flexeril for twice a day dosing.  He is to continue follow-up with his primary care physicians for his baseline medical care.   Follow Up Instructions:    I discussed the assessment and treatment plan with the patient. The patient was provided an opportunity to ask  questions and all were answered. The patient agreed with the plan and demonstrated an understanding of the instructions.   The patient was advised to call back or seek an in-person evaluation if the symptoms worsen or if the condition fails to improve as anticipated.  I provided 30 minutes of non-face-to-face time during this encounter.   Molli Barrows, MD

## 2019-04-25 NOTE — Telephone Encounter (Signed)
° °  [facility] Pre-operative Risk Assessment    Request for surgical clearance:  1. What type of surgery is being performed? Epidural steroid injection  2. When is this surgery scheduled? TBD  3. What type of clearance is required (medical clearance vs. Pharmacy clearance to hold med vs. Both)? pharmacy  4. Are there any medications that need to be held prior to surgery and how long? Plavix for 7 days before  5. Practice name and name of physician performing surgery? [facility]  6. What is your office phone number [phone]   7.   What is your office fax number [phone]  8.   Anesthesia type (None, local, MAC, general) ? Not noted   Marykay Lex 04/25/2019, 8:30 AM  _________________________________________________________________   (provider comments below)

## 2019-04-26 NOTE — Telephone Encounter (Signed)
Tough situation but can hold Aspirin and Plavix 5 days before.

## 2019-04-26 NOTE — Telephone Encounter (Signed)
   Primary Cardiologist: [name], MD  Chart reviewed as part of pre-operative protocol coverage. Patient was contacted 04/26/2019 in reference to pre-operative risk assessment for pending surgery as outlined below.  [name] was last seen on 02/17/2019 by Dr. Fletcher Anon.   [name] has a significant coronary history with last stenting 04/2018 with recommendations for indefinite DAPT with ASA and Plavix. He was doing well from a cardiac perspective at his last visit.   Per Dr. Fletcher Anon, the patient can hold ASA and Plavix up to 5 days before the injection. He will need to resume as soon as possible thereafter per procedural team.   Therefore, based on ACC/AHA guidelines, the patient would be at acceptable risk for the planned procedure without further cardiovascular testing.   I will route this recommendation to the requesting party via Epic fax function and remove from pre-op pool.  Please call with questions.  Kathyrn Drown, NP 04/26/2019, 8:54 AM

## 2019-05-02 ENCOUNTER — Encounter: Payer: Self-pay | Admitting: Family Medicine

## 2019-05-03 ENCOUNTER — Other Ambulatory Visit: Payer: Self-pay | Admitting: Family Medicine

## 2019-05-03 MED ORDER — TAMSULOSIN HCL 0.4 MG PO CAPS: 0.4000 mg | ORAL_CAPSULE | Freq: Every day | ORAL | 0 refills | Status: DC

## 2019-05-05 ENCOUNTER — Ambulatory Visit
Admission: RE | Admit: 2019-05-05 | Discharge: 2019-05-05 | Disposition: A | Discharge status: Home / Self Care | Payer: Medicare Other | Source: Ambulatory Visit | Attending: Family Medicine | Admitting: Family Medicine

## 2019-05-05 ENCOUNTER — Encounter: Payer: Self-pay | Admitting: Family Medicine

## 2019-05-05 ENCOUNTER — Ambulatory Visit (INDEPENDENT_AMBULATORY_CARE_PROVIDER_SITE_OTHER): Payer: Medicare Other | Admitting: Family Medicine

## 2019-05-05 ENCOUNTER — Other Ambulatory Visit: Payer: Self-pay

## 2019-05-05 VITALS — BP 105/60 | HR 97 | Temp 96.9°F | Resp 16 | Ht 67.0 in | Wt 241.2 lb

## 2019-05-05 DIAGNOSIS — R109 Unspecified abdominal pain: Secondary | ICD-10-CM

## 2019-05-05 DIAGNOSIS — R1011 Right upper quadrant pain: Secondary | ICD-10-CM | POA: Diagnosis not present

## 2019-05-05 DIAGNOSIS — E1142 Type 2 diabetes mellitus with diabetic polyneuropathy: Secondary | ICD-10-CM

## 2019-05-05 DIAGNOSIS — R3911 Hesitancy of micturition: Secondary | ICD-10-CM | POA: Diagnosis not present

## 2019-05-05 DIAGNOSIS — Z87442 Personal history of urinary calculi: Secondary | ICD-10-CM

## 2019-05-05 DIAGNOSIS — Z794 Long term (current) use of insulin: Secondary | ICD-10-CM

## 2019-05-05 DIAGNOSIS — R339 Retention of urine, unspecified: Secondary | ICD-10-CM | POA: Insufficient documentation

## 2019-05-05 DIAGNOSIS — I700 Atherosclerosis of aorta: Secondary | ICD-10-CM

## 2019-05-05 DIAGNOSIS — K8689 Other specified diseases of pancreas: Secondary | ICD-10-CM

## 2019-05-05 LAB — POCT URINALYSIS DIPSTICK
Bilirubin, UA: NEGATIVE
Blood, UA: NEGATIVE
Glucose, UA: NEGATIVE
Ketones, UA: NEGATIVE
Leukocytes, UA: NEGATIVE
Nitrite, UA: NEGATIVE
Protein, UA: NEGATIVE
Spec Grav, UA: 1.015 (ref 1.010–1.025)
Urobilinogen, UA: 0.2 E.U./dL
pH, UA: 5 (ref 5.0–8.0)

## 2019-05-05 NOTE — Progress Notes (Signed)
Name: [name]   MRN: [id number]    DOB: [birth year]   Date:05/05/2019       Progress Note  Subjective  Chief Complaint  Chief Complaint  Patient presents with  . Urinary Retention    Onset-2 months, has the urgency to go but when he does his stream is weak and his urine just drips out  . Back Pain    States he has chronic back pain but this is different right flank pain that radiates to his mid-abdominal region that is sharp at times    HPI  Urinary problems: symptoms started about 2 months ago, he has noticed urinary urgency, hesitancy, occasionally notices a burning, no fever or chills. Weak stream and small amount, gradually getting worse. He also has nocturia, no hematuria. He has a history of kidney stone many years ago and has noticed some right flank pain that shoots to abdominal area about 4 times a week   DMII: sees Dr Honor Junes, he states glucose at home has been fasting 80-100, denies polyphagia, polydipsia has urinary frequency  IPSS Questionnaire (AUA-7): Over the past month.   1)  How often have you had a sensation of not emptying your bladder completely after you finish urinating?  5 - Almost always  2)  How often have you had to urinate again less than two hours after you finished urinating? 5 - Almost always  3)  How often have you found you stopped and started again several times when you urinated?  5 - Almost always  4) How difficult have you found it to postpone urination?  0 - Not at all  5) How often have you had a weak urinary stream?  5 - Almost always  6) How often have you had to push or strain to begin urination?  4 - More than half the time  7) How many times did you most typically get up to urinate from the time you went to bed until the time you got up in the morning?  2 - 2 times  Total score:  0-7 mildly symptomatic   8-19 moderately symptomatic   20-35 severely symptomatic    Patient Active Problem List   Diagnosis Date Noted  . Idiopathic  peripheral neuropathy 04/21/2019  . Colitis 07/08/2018  . OSA on CPAP 07/08/2018  . Unstable angina ([place]) 04/26/2018  . Ischemic cardiomyopathy 04/26/2018  . Chronic combined systolic and diastolic CHF (congestive heart failure) ([place]) 04/26/2018  . Effort angina ([place]) 04/25/2018  . 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 Hyperlipidemia due to type 2 diabetes mellitus ([place]) 08/12/2017  . Vitamin D deficiency, unspecified 08/12/2017  . Ptosis of left eyelid 01/08/2017  . Dermatitis 12/24/2016  . Difficulty walking 04/27/2016  . Foot cramps 04/27/2016  . Morbid (severe) obesity due to excess calories ([place]) 04/06/2016  . Benign neoplasm of sigmoid colon   . Benign neoplasm of descending colon   . Benign neoplasm of transverse colon   . Coronary artery disease involving native coronary artery with angina pectoris ([place]) 10/22/2015  . Coronary artery disease 08/22/2015  . DM (diabetes mellitus), type 2, uncontrolled with complications ([place]) 75/07/2584  . Myasthenia gravis ([place]) 08/22/2015  . Chronic left-sided low back pain 08/22/2015  . Hypertension 08/22/2015  . GERD (gastroesophageal reflux disease) 08/22/2015  . Arthritis 08/22/2015  . Diabetic peripheral neuropathy ([place]) 08/22/2015  . Hyperlipidemia 08/22/2015  . Insomnia 08/22/2015  . Type 2 diabetes mellitus with diabetic polyneuropathy, with long-term current  use of insulin ([place]) 08/22/2015    Past Surgical History:  Procedure Laterality Date  . BILATERAL CARPAL TUNNEL RELEASE Bilateral L in 2012 and R in 2013  . CARDIAC CATHETERIZATION     Several Caths, most recent in  March 2016.  [name] COLONOSCOPY WITH PROPOFOL N/A 01/10/2016   Procedure: COLONOSCOPY WITH PROPOFOL;  Surgeon: [name], MD;  Location: [facility] ENDOSCOPY;  Service: Endoscopy;  Laterality: N/A;  . CORONARY ANGIOPLASTY    . CORONARY STENT INTERVENTION N/A 04/25/2018   Procedure: CORONARY STENT INTERVENTION;  Surgeon: [name],  MD;  Location: [place] [facility];  Service: Cardiovascular;  Laterality: N/A;  . ESOPHAGOGASTRODUODENOSCOPY (EGD) WITH PROPOFOL N/A 01/10/2016   Procedure: ESOPHAGOGASTRODUODENOSCOPY (EGD) WITH PROPOFOL;  Surgeon: [name], MD;  Location: [facility] ENDOSCOPY;  Service: Endoscopy;  Laterality: N/A;  . EYE SURGERY Bilateral 2012   cataract/bilateral vitrectomies  . LEFT HEART CATH AND CORONARY ANGIOGRAPHY Left 04/25/2018   Procedure: LEFT HEART CATH AND CORONARY ANGIOGRAPHY;  Surgeon: [name], MD;  Location: [place] [facility];  Service: Cardiovascular;  Laterality: Left;  . TEE WITHOUT CARDIOVERSION N/A 09/05/2018   Procedure: TRANSESOPHAGEAL ECHOCARDIOGRAM (TEE);  Surgeon: [name], MD;  Location: [facility] ORS;  Service: Cardiovascular;  Laterality: N/A;  . TONSILLECTOMY AND ADENOIDECTOMY     As a child  . TUNNELED VENOUS CATHETER PLACEMENT     removed    Family History  Problem Relation Age of Onset  . Diabetes Mother   . Heart disease Mother   . Cancer Father        Prostate CA  . Diabetes Brother   . Healthy Brother   . Healthy Brother     Social History   Socioeconomic History  . Marital status: Married    Spouse name: Neoma Laming  . Number of children: 0  . Years of education: Not on file  . Highest education level: Master's degree (e.g., MA, MS, MEng, MEd, MSW, MBA)  Occupational History  . Occupation: disabled    Comment: multiple medical problems - first approved for uncontrolled DM, but now has heart disease and Myasthenia Gravis   Social Needs  . Financial resource strain: Not hard at all  . Food insecurity    Worry: Never true    Inability: Never true  . Transportation needs    Medical: No    Non-medical: No  Tobacco Use  . Smoking status: Never Smoker  . Smokeless tobacco: Never Used  . Tobacco comment: smoking cessation materials not required  Substance and Sexual Activity  . Alcohol use: Not Currently    Alcohol/week: 0.0 standard drinks  .  Drug use: No  . Sexual activity: Not Currently    Partners: Female  Lifestyle  . Physical activity    Days per week: 7 days    Minutes per session: 30 min  . Stress: Not at all  Relationships  . Social connections    Talks on phone: More than three times a week    Gets together: Three times a week    Attends religious service: More than 4 times per year    Active member of club or organization: Yes    Attends meetings of clubs or organizations: More than 4 times per year    Relationship status: Married  . Intimate partner violence    Fear of current or ex partner: No    Emotionally abused: No    Physically abused: No    Forced sexual activity: No  Other Topics Concern  . Not on file  Social History Narrative  . Not on file     Current Outpatient Medications:  .  albuterol (PROVENTIL HFA;VENTOLIN HFA) 108 (90 Base) MCG/ACT inhaler, Inhale 2 puffs into the lungs every 6 (six) hours as needed for wheezing or shortness of breath., Disp: , Rfl:  .  albuterol (PROVENTIL) (2.5 MG/3ML) 0.083% nebulizer solution, Take 2.5 mg by nebulization every 6 (six) hours as needed for wheezing or shortness of breath., Disp: , Rfl:  .  aspirin EC 81 MG tablet, Take 1 tablet (81 mg total) by mouth daily., Disp: 90 tablet, Rfl: 3 .  cholecalciferol (VITAMIN D3) 25 MCG (1000 UT) tablet, Take 2,000 Units by mouth daily., Disp: , Rfl:  .  clopidogrel (PLAVIX) 75 MG tablet, Take 1 tablet (75 mg total) by mouth daily with breakfast., Disp: 30 tablet, Rfl: 6 .  cyanocobalamin 1000 MCG tablet, Take 1,000 mcg by mouth daily. , Disp: , Rfl:  .  cyclobenzaprine (FLEXERIL) 10 MG tablet, Take 1 tablet (10 mg total) by mouth 2 (two) times daily., Disp: 60 tablet, Rfl: 2 .  DEXILANT 60 MG capsule, TAKE ONE CAPSULE BY MOUTH DAILY, Disp: 30 capsule, Rfl: 11 .  ezetimibe (ZETIA) 10 MG tablet, Take 1 tablet (10 mg total) by mouth daily., Disp: 90 tablet, Rfl: 3 .  fluticasone furoate-vilanterol (BREO ELLIPTA) 200-25  MCG/INH AEPB, Inhale 1 puff into the lungs daily., Disp: 3 each, Rfl: 1 .  furosemide (LASIX) 20 MG tablet, Take 20 mg by mouth every other day., Disp: , Rfl:  .  gabapentin (NEURONTIN) 300 MG capsule, Take 1 capsule (300 mg total) by mouth 3 (three) times daily., Disp: 90 capsule, Rfl: 1 .  gabapentin (NEURONTIN) 800 MG tablet, Take 800 mg by mouth at bedtime., Disp: , Rfl:  .  insulin lispro (HUMALOG KWIKPEN) 100 UNIT/ML KiwkPen, Inject 7-8 Units into the skin 3 (three) times daily. Reported on 12/02/2015/ sliding scale 1 unit for every 8 units of carbs; and 1 unit for every 20 above 120, Disp: , Rfl:  .  Insulin Pen Needle (FIFTY50 PEN NEEDLES) 32G X 4 MM MISC, Inject 1 each into the skin as needed., Disp: , Rfl:  .  LANTUS SOLOSTAR 100 UNIT/ML Solostar Pen, Inject 25-30 Units into the skin daily at 10 pm. , Disp: , Rfl: 0 .  levocetirizine (XYZAL) 5 MG tablet, TAKE 1 TABLET(5 MG) BY MOUTH EVERY EVENING, Disp: 30 tablet, Rfl: 3 .  liraglutide (VICTOZA) 18 MG/3ML SOPN, 1.8 units every morning (Patient taking differently: Inject 1.8 mg into the skin daily. ), Disp: , Rfl:  .  loratadine (CLARITIN) 10 MG tablet, Take 10 mg by mouth daily. , Disp: , Rfl:  .  metFORMIN (GLUCOPHAGE) 1000 MG tablet, Take 1,000 mg by mouth 2 (two) times daily with a meal., Disp: , Rfl:  .  metoprolol succinate (TOPROL-XL) 100 MG 24 hr tablet, Take 1 tablet (100 mg total) by mouth daily. Take with or immediately following a meal., Disp: 30 tablet, Rfl: 3 .  montelukast (SINGULAIR) 10 MG tablet, Take 10 mg by mouth at bedtime., Disp: , Rfl:  .  naloxone (NARCAN) nasal spray 4 mg/0.1 mL, For excess sedation from opioids, Disp: 1 kit, Rfl: 2 .  nitroGLYCERIN (NITROSTAT) 0.3 MG SL tablet, Place 1 tablet (0.3 mg total) under the tongue every 5 (five) minutes as needed for chest pain. Maximum 3 doses., Disp: 25 tablet, Rfl: 3 .  [START ON 05/19/2019]  Oxycodone HCl 10 MG TABS, Take 1 tablet (10 mg total) by mouth 4 (four) times  daily., Disp: 120 tablet, Rfl: 0 .  [START ON 06/18/2019] Oxycodone HCl 10 MG TABS, Take 1 tablet (10 mg total) by mouth 4 (four) times daily., Disp: 120 tablet, Rfl: 0 .  rosuvastatin (CRESTOR) 5 MG tablet, Take 5 mg by mouth daily., Disp: , Rfl: 0 .  sacubitril-valsartan (ENTRESTO) 24-26 MG, Take 1 tablet by mouth 2 (two) times daily., Disp: 60 tablet, Rfl: 1 .  tamsulosin (FLOMAX) 0.4 MG CAPS capsule, Take 1 capsule (0.4 mg total) by mouth daily., Disp: 30 capsule, Rfl: 0  Allergies  Allergen Reactions  . Azathioprine Other (See Comments)    Azathioprine hypersensitivity reaction  . Metoprolol Rash  . Novolog [Insulin Aspart] Hives    I personally reviewed active problem list, medication list, allergies, family history, social history with the patient/caregiver today.   ROS  Ten systems reviewed and is negative except as mentioned in HPI   Objective  Vitals:   05/05/19 0921  BP: 105/60  Pulse: 97  Resp: 16  Temp: (!) 96.9 F (36.1 C)  TempSrc: Oral  SpO2: 96%  Weight: 241 lb 3.2 oz (109.4 kg)  Height: '5\' 7"'$  (1.702 m)    Body mass index is 37.78 kg/m.  Physical Exam  Constitutional: Patient appears well-developed and well-nourished. Obese  No distress.  HEENT: head atraumatic, normocephalic, pupils equal and reactive to light,neck supple, mild ptosis left eyelid Cardiovascular: Normal rate, regular rhythm and normal heart sounds.  No murmur heard. No BLE edema. Pulmonary/Chest: Effort normal and breath sounds normal. No respiratory distress. Abdominal: Soft.  There is negative CVA tenderness, he has RUQ tenderness, discomfort during supra pubic area, but bladder does not seem very distended  Rectal : soft and slightly enlarged prostate, no tenderness  Psychiatric: Patient has a normal mood and affect. behavior is normal. Judgment and thought content normal.  Recent Results (from the past 2160 hour(s))  NM Myocar Multi W/Spect W/Wall Motion / EF     Status: None    Collection Time: 02/14/19 11:39 AM  Result Value Ref Range   Rest HR 88 bpm   Rest BP 106/68 mmHg   Exercise duration (sec) 0 sec   Percent HR 65 %   Exercise duration (min) 0 min   Estimated workload 1.0 METS   Peak HR 105 bpm   Peak BP 107/60 mmHg   MPHR 161 bpm   TID 1.20    LV sys vol 48 mL   LV dias vol 117 62 - 150 mL  POCT urinalysis dipstick     Status: Normal   Collection Time: 05/05/19  9:30 AM  Result Value Ref Range   Color, UA amber    Clarity, UA clear    Glucose, UA Negative Negative   Bilirubin, UA neg    Ketones, UA neg    Spec Grav, UA 1.015 1.010 - 1.025   Blood, UA neg    pH, UA 5.0 5.0 - 8.0   Protein, UA Negative Negative   Urobilinogen, UA 0.2 0.2 or 1.0 E.U./dL   Nitrite, UA neg    Leukocytes, UA Negative Negative   Appearance     Odor       PHQ2/9: Depression screen [facility] 2/9 05/05/2019 12/12/2018 11/17/2018 09/29/2018 09/06/2018  Decreased Interest 0 0 0 1 0  Down, Depressed, Hopeless 0 0 0 1 0  PHQ - 2 Score 0 0 0 2 0  Altered sleeping 0 1 - 0 -  Tired, decreased energy 0 1 - 3 -  Change in appetite 0 1 - 2 -  Feeling bad or failure about yourself  0 0 - 1 -  Trouble concentrating 0 0 - 0 -  Moving slowly or fidgety/restless 0 0 - 0 -  Suicidal thoughts 0 0 - 0 -  PHQ-9 Score 0 3 - 8 -  Difficult doing work/chores Not difficult at all Not difficult at all - Somewhat difficult -  Some recent data might be hidden    phq 9 is negative   Fall Risk: Fall Risk  05/05/2019 12/12/2018 11/17/2018 09/29/2018 09/06/2018  Falls in the past year? 0 0 0 0 0  Comment - - - - -  Number falls in past yr: 0 0 - 0 0  Comment - - - - -  Injury with Fall? 0 0 - 0 0  Risk Factor Category  - - - - -  Risk for fall due to : - - - - -  Risk for fall due to: Comment - - - - -  Follow up - - - - -     Functional Status Survey: Is the patient deaf or have difficulty hearing?: No Does the patient have difficulty seeing, even when wearing glasses/contacts?:  Yes Does the patient have difficulty concentrating, remembering, or making decisions?: No Does the patient have difficulty walking or climbing stairs?: Yes Does the patient have difficulty dressing or bathing?: No Does the patient have difficulty doing errands alone such as visiting a doctor's office or shopping?: No    Assessment & Plan  1. Urinary hesitancy  - POCT urinalysis dipstick - CT RENAL STONE STUDY; Future - PSA - Referral Urologist   2. Urinary retention  - POCT urinalysis dipstick - CT RENAL STONE STUDY; Future - PSA  3. Acute right flank pain  - POCT urinalysis dipstick - CT RENAL STONE STUDY; Future - PSA  4. Type 2 diabetes mellitus with diabetic polyneuropathy, with long-term current use of insulin (HCC)  - COMPLETE METABOLIC PANEL WITH GFR - Hemoglobin A1c - Microalbumin / creatinine urine ratio  5. History of kidney stones  - CT RENAL STONE STUDY; Future  6. Right upper quadrant abdominal pain  - CT RENAL STONE STUDY; Future  Reviewed CT results with patient, continue flomax and wait for appointmetn with Urologist for further evaluation

## 2019-05-06 LAB — COMPLETE METABOLIC PANEL WITH GFR
AG Ratio: 2.1 (calc) (ref 1.0–2.5)
ALT: 14 U/L (ref 9–46)
AST: 16 U/L (ref 10–35)
Albumin: 4 g/dL (ref 3.6–5.1)
Alkaline phosphatase (APISO): 63 U/L (ref 35–144)
BUN/Creatinine Ratio: 21 (calc) (ref 6–22)
BUN: 33 mg/dL — ABNORMAL HIGH (ref 7–25)
CO2: 24 mmol/L (ref 20–32)
Calcium: 9.7 mg/dL (ref 8.6–10.3)
Chloride: 103 mmol/L (ref 98–110)
Creat: 1.54 mg/dL — ABNORMAL HIGH (ref 0.70–1.33)
GFR, Est African American: 56 mL/min/{1.73_m2} — ABNORMAL LOW (ref >=60)
GFR, Est Non African American: 49 mL/min/{1.73_m2} — ABNORMAL LOW (ref >=60)
Globulin: 1.9 g/dL (calc) (ref 1.9–3.7)
Glucose, Bld: 177 mg/dL — ABNORMAL HIGH (ref 65–99)
Potassium: 5.1 mmol/L (ref 3.5–5.3)
Sodium: 136 mmol/L (ref 135–146)
Total Bilirubin: 0.4 mg/dL (ref 0.2–1.2)
Total Protein: 5.9 g/dL — ABNORMAL LOW (ref 6.1–8.1)

## 2019-05-06 LAB — HEMOGLOBIN A1C
Hgb A1c MFr Bld: 7.9 % of total Hgb — ABNORMAL HIGH (ref <5.7)
Mean Plasma Glucose: 180 (calc)
eAG (mmol/L): 10 (calc)

## 2019-05-06 LAB — PSA: PSA: 0.7 ng/mL (ref <=4.0)

## 2019-05-06 LAB — MICROALBUMIN / CREATININE URINE RATIO
Creatinine, Urine: 172 mg/dL (ref 20–320)
Microalb Creat Ratio: 8 mcg/mg creat (ref <30)
Microalb, Ur: 1.4 mg/dL

## 2019-05-15 DIAGNOSIS — E1165 Type 2 diabetes mellitus with hyperglycemia: Secondary | ICD-10-CM | POA: Diagnosis not present

## 2019-05-15 DIAGNOSIS — Z794 Long term (current) use of insulin: Secondary | ICD-10-CM | POA: Diagnosis not present

## 2019-05-16 ENCOUNTER — Telehealth: Payer: Self-pay

## 2019-05-16 NOTE — Telephone Encounter (Signed)
Left voicemail, notified pt that request for clearance to stop Plavix for 7 days was faxed to Dr. Tyrell Antonio office on 04-24-19. We have not received a response. Asked pt to call Dr. Tyrell Antonio office to obtain clearance to stop Plavix 7 days.

## 2019-05-16 NOTE — Telephone Encounter (Signed)
He has a procedure on 05/24/19 and wants to know if we got clearance for him to stop his plavix and when he should stop it. Please call.

## 2019-05-17 ENCOUNTER — Telehealth: Payer: Self-pay | Admitting: *Deleted

## 2019-05-17 NOTE — Telephone Encounter (Signed)
Notified patient ok to stop Plavix 5 days before procedure.

## 2019-05-22 ENCOUNTER — Other Ambulatory Visit: Payer: Self-pay | Admitting: Anesthesiology

## 2019-05-24 ENCOUNTER — Other Ambulatory Visit: Payer: Self-pay | Admitting: Anesthesiology

## 2019-05-24 ENCOUNTER — Ambulatory Visit (HOSPITAL_BASED_OUTPATIENT_CLINIC_OR_DEPARTMENT_OTHER): Payer: Medicare Other | Admitting: Anesthesiology

## 2019-05-24 ENCOUNTER — Ambulatory Visit
Admission: RE | Admit: 2019-05-24 | Discharge: 2019-05-24 | Disposition: A | Discharge status: Home / Self Care | Payer: Medicare Other | Source: Ambulatory Visit | Attending: Anesthesiology | Admitting: Anesthesiology

## 2019-05-24 ENCOUNTER — Other Ambulatory Visit: Payer: Self-pay

## 2019-05-24 ENCOUNTER — Encounter: Payer: Self-pay | Admitting: Anesthesiology

## 2019-05-24 VITALS — BP 136/72 | HR 106 | Temp 97.8°F | Resp 18 | Ht 67.0 in | Wt 235.0 lb

## 2019-05-24 DIAGNOSIS — G7000 Myasthenia gravis without (acute) exacerbation: Secondary | ICD-10-CM | POA: Diagnosis not present

## 2019-05-24 DIAGNOSIS — M51369 Other intervertebral disc degeneration, lumbar region without mention of lumbar back pain or lower extremity pain: Secondary | ICD-10-CM

## 2019-05-24 DIAGNOSIS — M5136 Other intervertebral disc degeneration, lumbar region: Secondary | ICD-10-CM

## 2019-05-24 DIAGNOSIS — F1190 Opioid use, unspecified, uncomplicated: Secondary | ICD-10-CM

## 2019-05-24 DIAGNOSIS — M542 Cervicalgia: Secondary | ICD-10-CM | POA: Insufficient documentation

## 2019-05-24 DIAGNOSIS — G894 Chronic pain syndrome: Secondary | ICD-10-CM

## 2019-05-24 DIAGNOSIS — M5432 Sciatica, left side: Secondary | ICD-10-CM

## 2019-05-24 DIAGNOSIS — R52 Pain, unspecified: Secondary | ICD-10-CM | POA: Insufficient documentation

## 2019-05-24 DIAGNOSIS — M4716 Other spondylosis with myelopathy, lumbar region: Secondary | ICD-10-CM | POA: Insufficient documentation

## 2019-05-24 MED ORDER — IOPAMIDOL (ISOVUE-M 200) INJECTION 41%
20.0000 mL | Freq: Once | INTRAMUSCULAR | Status: DC | PRN
  Administered 2019-05-24: 20 mL
  Filled 2019-05-24: qty 20

## 2019-05-24 MED ORDER — ROPIVACAINE HCL 2 MG/ML IJ SOLN
INTRAMUSCULAR | Status: AC
  Filled 2019-05-24: qty 10

## 2019-05-24 MED ORDER — TRIAMCINOLONE ACETONIDE 40 MG/ML IJ SUSP
40.0000 mg | Freq: Once | INTRAMUSCULAR | Status: AC
  Administered 2019-05-24: 11:00:00 40 mg

## 2019-05-24 MED ORDER — SODIUM CHLORIDE 0.9% FLUSH
10.0000 mL | Freq: Once | INTRAVENOUS | Status: AC
  Administered 2019-05-24: 10 mL

## 2019-05-24 MED ORDER — ROPIVACAINE HCL 2 MG/ML IJ SOLN
10.0000 mL | Freq: Once | INTRAMUSCULAR | Status: AC
  Administered 2019-05-24: 11:00:00 10 mL via EPIDURAL

## 2019-05-24 MED ORDER — SODIUM CHLORIDE (PF) 0.9 % IJ SOLN
INTRAMUSCULAR | Status: AC
  Filled 2019-05-24: qty 10

## 2019-05-24 MED ORDER — LIDOCAINE HCL (PF) 1 % IJ SOLN
INTRAMUSCULAR | Status: AC
  Filled 2019-05-24: qty 10

## 2019-05-24 MED ORDER — LIDOCAINE HCL (PF) 1 % IJ SOLN
5.0000 mL | Freq: Once | INTRAMUSCULAR | Status: AC
  Administered 2019-05-24: 11:00:00 5 mL via SUBCUTANEOUS

## 2019-05-24 MED ORDER — CYCLOBENZAPRINE HCL 10 MG PO TABS: 10.0000 mg | ORAL_TABLET | Freq: Two times a day (BID) | ORAL | 2 refills | Status: DC

## 2019-05-24 MED ORDER — TRIAMCINOLONE ACETONIDE 40 MG/ML IJ SUSP
INTRAMUSCULAR | Status: AC
  Filled 2019-05-24: qty 1

## 2019-05-24 NOTE — Progress Notes (Signed)
Subjective:  Patient ID: [name], male    DOB: 1961/03/16  Age: 58 y.o. MRN: [id number]  CC: Back Pain   Procedure: L5-S1 epidural steroid under fluoroscopic guidance with no sedation  HPI [name] presents for reevaluation.  He was last seen several months ago and has been followed by video virtual conferencing secondary to the COVID crisis.  Over the past several weeks he has experienced increasing low back pain of the same quality as previously documented.  In the past he is also had left side facet injections to help with this however at present he is having more pain in the left calf and left foot with associated numbness and tingling in the base of the left foot.  Calf cramping is on the left side greater than right but this occasionally occurs on the right side as well.  He has some chronic weakness to the lower extremities associated with his myasthenia.  For his low back pain he generally takes his opioid medications effectively with no side effects reported.  These continue to give him good relief.  He has been taking these and compliant.  He also takes Flexeril for muscle spasming and is currently out of this.  No new changes in bowel or bladder function are noted.  His lower extremity strength is been stable.  Outpatient Medications Prior to Visit  Medication Sig Dispense Refill  . albuterol (PROVENTIL HFA;VENTOLIN HFA) 108 (90 Base) MCG/ACT inhaler Inhale 2 puffs into the lungs every 6 (six) hours as needed for wheezing or shortness of breath.    [name] albuterol (PROVENTIL) (2.5 MG/3ML) 0.083% nebulizer solution Take 2.5 mg by nebulization every 6 (six) hours as needed for wheezing or shortness of breath.    [name] aspirin EC 81 MG tablet Take 1 tablet (81 mg total) by mouth daily. 90 tablet 3  . cholecalciferol (VITAMIN D3) 25 MCG (1000 UT) tablet Take 2,000 Units by mouth daily.    . clopidogrel (PLAVIX) 75 MG tablet Take 1 tablet (75 mg total) by mouth daily with breakfast.  30 tablet 6  . cyanocobalamin 1000 MCG tablet Take 1,000 mcg by mouth daily.     [name] DEXILANT 60 MG capsule TAKE ONE CAPSULE BY MOUTH DAILY 30 capsule 11  . ezetimibe (ZETIA) 10 MG tablet Take 1 tablet (10 mg total) by mouth daily. 90 tablet 3  . fluticasone furoate-vilanterol (BREO ELLIPTA) 200-25 MCG/INH AEPB Inhale 1 puff into the lungs daily. 3 each 1  . furosemide (LASIX) 20 MG tablet Take 20 mg by mouth every other day.    . gabapentin (NEURONTIN) 300 MG capsule Take 1 capsule (300 mg total) by mouth 3 (three) times daily. 90 capsule 1  . gabapentin (NEURONTIN) 800 MG tablet Take 800 mg by mouth at bedtime.    . insulin lispro (HUMALOG KWIKPEN) 100 UNIT/ML KiwkPen Inject 7-8 Units into the skin 3 (three) times daily. Reported on 12/02/2015/ sliding scale 1 unit for every 8 units of carbs; and 1 unit for every 20 above 120    . Insulin Pen Needle (FIFTY50 PEN NEEDLES) 32G X 4 MM MISC Inject 1 each into the skin as needed.    [name] LANTUS SOLOSTAR 100 UNIT/ML Solostar Pen Inject 25-30 Units into the skin daily at 10 pm.   0  . levocetirizine (XYZAL) 5 MG tablet TAKE 1 TABLET(5 MG) BY MOUTH EVERY EVENING 30 tablet 3  . liraglutide (VICTOZA) 18 MG/3ML SOPN 1.8 units every morning (Patient taking differently:  Inject 1.8 mg into the skin daily. )    . loratadine (CLARITIN) 10 MG tablet Take 10 mg by mouth daily.     . metFORMIN (GLUCOPHAGE) 1000 MG tablet Take 1,000 mg by mouth 2 (two) times daily with a meal.    . montelukast (SINGULAIR) 10 MG tablet Take 10 mg by mouth at bedtime.    . naloxone (NARCAN) nasal spray 4 mg/0.1 mL For excess sedation from opioids 1 kit 2  . nitroGLYCERIN (NITROSTAT) 0.3 MG SL tablet Place 1 tablet (0.3 mg total) under the tongue every 5 (five) minutes as needed for chest pain. Maximum 3 doses. 25 tablet 3  . Oxycodone HCl 10 MG TABS Take 1 tablet (10 mg total) by mouth 4 (four) times daily. 120 tablet 0  . [START ON 06/18/2019] Oxycodone HCl 10 MG TABS Take 1 tablet (10 mg  total) by mouth 4 (four) times daily. 120 tablet 0  . rosuvastatin (CRESTOR) 5 MG tablet Take 5 mg by mouth daily.  0  . sacubitril-valsartan (ENTRESTO) 24-26 MG Take 1 tablet by mouth 2 (two) times daily. 60 tablet 1  . tamsulosin (FLOMAX) 0.4 MG CAPS capsule Take 1 capsule (0.4 mg total) by mouth daily. 30 capsule 0  . cyclobenzaprine (FLEXERIL) 10 MG tablet Take 1 tablet (10 mg total) by mouth 2 (two) times daily. 60 tablet 2  . metoprolol succinate (TOPROL-XL) 100 MG 24 hr tablet Take 1 tablet (100 mg total) by mouth daily. Take with or immediately following a meal. 30 tablet 3   No facility-administered medications prior to visit.     Review of Systems CNS: No confusion or sedation Cardiac: No angina or palpitations GI: No abdominal pain or constipation Constitutional: No nausea vomiting fevers or chills  Objective:  BP 136/72   Pulse (!) 106   Temp 97.8 F (36.6 C)   Resp 18   Ht '5\' 7"'$  (1.702 m)   Wt 235 lb (106.6 kg)   SpO2 97%   BMI 36.81 kg/m    BP Readings from Last 3 Encounters:  05/24/19 136/72  05/05/19 105/60  03/22/19 93/66     Wt Readings from Last 3 Encounters:  05/24/19 235 lb (106.6 kg)  05/05/19 241 lb 3.2 oz (109.4 kg)  03/22/19 232 lb (105.2 kg)     Physical Exam Pt is alert and oriented PERRL EOMI HEART IS RRR no murmur or rub LCTA no wheezing or rales MUSCULOSKELETAL reveals some paraspinous muscle tenderness but no overt trigger points.  He has a mildly antalgic gait.  He has a positive straight leg raise on the left side equivocal on the right  Labs  Lab Results  Component Value Date   HGBA1C 7.9 (H) 05/05/2019   HGBA1C 7.8 10/06/2018   HGBA1C 7.9 (A) 06/01/2018   Lab Results  Component Value Date   MICROALBUR 1.4 05/05/2019   LDLCALC 50 05/18/2018   CREATININE 1.54 (H) 05/05/2019    -------------------------------------------------------------------------------------------------------------------- Lab Results  Component  Value Date   WBC 5.9 02/03/2019   HGB 11.5 (L) 02/03/2019   HCT 35.9 (L) 02/03/2019   PLT 178 02/03/2019   GLUCOSE 177 (H) 05/05/2019   CHOL 109 05/18/2018   TRIG 112 05/18/2018   HDL 37 05/18/2018   LDLCALC 50 05/18/2018   ALT 14 05/05/2019   AST 16 05/05/2019   NA 136 05/05/2019   K 5.1 05/05/2019   CL 103 05/05/2019   CREATININE 1.54 (H) 05/05/2019   BUN 33 (H) 05/05/2019  CO2 24 05/05/2019   TSH 4.130 11/25/2017   PSA 0.7 05/05/2019   INR 1.26 07/07/2018   HGBA1C 7.9 (H) 05/05/2019   MICROALBUR 1.4 05/05/2019    --------------------------------------------------------------------------------------------------------------------- No results found.   Assessment & Plan:   [name] was seen today for back pain.  Diagnoses and all orders for this visit:  Myasthenia gravis ([place])  DDD (degenerative disc disease), lumbar -     Lumbar Epidural Injection  Lumbar spondylosis with myelopathy -     Lumbar Epidural Injection  Sciatica, left side -     Lumbar Epidural Injection  Cervicalgia  Chronic pain syndrome  Chronic, continuous use of opioids  Other orders -     cyclobenzaprine (FLEXERIL) 10 MG tablet; Take 1 tablet (10 mg total) by mouth 2 (two) times daily. -     triamcinolone acetonide (KENALOG-40) injection 40 mg -     sodium chloride flush (NS) 0.9 % injection 10 mL -     ropivacaine (PF) 2 mg/mL (0.2%) (NAROPIN) injection 10 mL -     lidocaine (PF) (XYLOCAINE) 1 % injection 5 mL -     iopamidol (ISOVUE-M) 41 % intrathecal injection 20 mL        ----------------------------------------------------------------------------------------------------------------------  Problem List Items Addressed This Visit      Unprioritized   Myasthenia gravis ([place]) - Primary   Relevant Medications   cyclobenzaprine (FLEXERIL) 10 MG tablet    Other Visit Diagnoses    DDD (degenerative disc disease), lumbar       Relevant Medications   cyclobenzaprine (FLEXERIL)  10 MG tablet   triamcinolone acetonide (KENALOG-40) injection 40 mg (Completed)   Lumbar spondylosis with myelopathy       Relevant Medications   cyclobenzaprine (FLEXERIL) 10 MG tablet   triamcinolone acetonide (KENALOG-40) injection 40 mg (Completed)   Sciatica, left side       Relevant Medications   cyclobenzaprine (FLEXERIL) 10 MG tablet   Cervicalgia       Chronic pain syndrome       Relevant Medications   cyclobenzaprine (FLEXERIL) 10 MG tablet   triamcinolone acetonide (KENALOG-40) injection 40 mg (Completed)   ropivacaine (PF) 2 mg/mL (0.2%) (NAROPIN) injection 10 mL (Completed)   lidocaine (PF) (XYLOCAINE) 1 % injection 5 mL (Completed)   Chronic, continuous use of opioids            ----------------------------------------------------------------------------------------------------------------------  1. DDD (degenerative disc disease), lumbar Based on today's clinical findings and his history I am going to proceed with an epidural steroid injection per patient request to see if we can help with some of the sciatica-like symptoms in the L5 distribution left side.  We gone over the risks and benefits of this procedure in full detail all questions been answered.  I have him continue with back stretching strengthening exercises as well - Lumbar Epidural Injection  2. Lumbar spondylosis with myelopathy As above - Lumbar Epidural Injection  3. Sciatica, left side As above - Lumbar Epidural Injection  4. Myasthenia gravis ([place]) Continue follow-up with his primary care physicians for baseline medical care  5. Cervicalgia Continue stretching strengthening exercises.  I am also going to refill his Flexeril at 10 mg tablets twice a day  6. Chronic pain syndrome He is currently has prescriptions for August and September for his opioid medications.  We will have him return to clinic in 2 months for reevaluation.  He is to restart his Plavix tomorrow.  He has been off this for  7 days  for his above-mentioned procedure.  7. Chronic, continuous use of opioids As    ----------------------------------------------------------------------------------------------------------------------  I am having [name] "[name]" maintain his insulin lispro, loratadine, albuterol, gabapentin, metFORMIN, cyanocobalamin, albuterol, Lantus SoloStar, naloxone, rosuvastatin, Insulin Pen Needle, ezetimibe, aspirin EC, liraglutide, nitroGLYCERIN, gabapentin, clopidogrel, Dexilant, furosemide, montelukast, cholecalciferol, metoprolol succinate, levocetirizine, Breo Ellipta, sacubitril-valsartan, Oxycodone HCl, Oxycodone HCl, tamsulosin, and cyclobenzaprine. We administered triamcinolone acetonide, sodium chloride flush, ropivacaine (PF) 2 mg/mL (0.2%), lidocaine (PF), and iopamidol.   Meds ordered this encounter  Medications  . cyclobenzaprine (FLEXERIL) 10 MG tablet    Sig: Take 1 tablet (10 mg total) by mouth 2 (two) times daily.    Dispense:  60 tablet    Refill:  2  . triamcinolone acetonide (KENALOG-40) injection 40 mg  . sodium chloride flush (NS) 0.9 % injection 10 mL  . ropivacaine (PF) 2 mg/mL (0.2%) (NAROPIN) injection 10 mL  . lidocaine (PF) (XYLOCAINE) 1 % injection 5 mL  . iopamidol (ISOVUE-M) 41 % intrathecal injection 20 mL   Patient's Medications  New Prescriptions   No medications on file  Previous Medications   ALBUTEROL (PROVENTIL HFA;VENTOLIN HFA) 108 (90 BASE) MCG/ACT INHALER    Inhale 2 puffs into the lungs every 6 (six) hours as needed for wheezing or shortness of breath.   ALBUTEROL (PROVENTIL) (2.5 MG/3ML) 0.083% NEBULIZER SOLUTION    Take 2.5 mg by nebulization every 6 (six) hours as needed for wheezing or shortness of breath.   ASPIRIN EC 81 MG TABLET    Take 1 tablet (81 mg total) by mouth daily.   CHOLECALCIFEROL (VITAMIN D3) 25 MCG (1000 UT) TABLET    Take 2,000 Units by mouth daily.   CLOPIDOGREL (PLAVIX) 75 MG TABLET    Take 1 tablet (75 mg total)  by mouth daily with breakfast.   CYANOCOBALAMIN 1000 MCG TABLET    Take 1,000 mcg by mouth daily.    DEXILANT 60 MG CAPSULE    TAKE ONE CAPSULE BY MOUTH DAILY   EZETIMIBE (ZETIA) 10 MG TABLET    Take 1 tablet (10 mg total) by mouth daily.   FLUTICASONE FUROATE-VILANTEROL (BREO ELLIPTA) 200-25 MCG/INH AEPB    Inhale 1 puff into the lungs daily.   FUROSEMIDE (LASIX) 20 MG TABLET    Take 20 mg by mouth every other day.   GABAPENTIN (NEURONTIN) 300 MG CAPSULE    Take 1 capsule (300 mg total) by mouth 3 (three) times daily.   GABAPENTIN (NEURONTIN) 800 MG TABLET    Take 800 mg by mouth at bedtime.   INSULIN LISPRO (HUMALOG KWIKPEN) 100 UNIT/ML KIWKPEN    Inject 7-8 Units into the skin 3 (three) times daily. Reported on 12/02/2015/ sliding scale 1 unit for every 8 units of carbs; and 1 unit for every 20 above 120   INSULIN PEN NEEDLE (FIFTY50 PEN NEEDLES) 32G X 4 MM MISC    Inject 1 each into the skin as needed.   LANTUS SOLOSTAR 100 UNIT/ML SOLOSTAR PEN    Inject 25-30 Units into the skin daily at 10 pm.    LEVOCETIRIZINE (XYZAL) 5 MG TABLET    TAKE 1 TABLET(5 MG) BY MOUTH EVERY EVENING   LIRAGLUTIDE (VICTOZA) 18 MG/3ML SOPN    1.8 units every morning   LORATADINE (CLARITIN) 10 MG TABLET    Take 10 mg by mouth daily.    METFORMIN (GLUCOPHAGE) 1000 MG TABLET    Take 1,000 mg by mouth 2 (two) times daily with a meal.  METOPROLOL SUCCINATE (TOPROL-XL) 100 MG 24 HR TABLET    Take 1 tablet (100 mg total) by mouth daily. Take with or immediately following a meal.   MONTELUKAST (SINGULAIR) 10 MG TABLET    Take 10 mg by mouth at bedtime.   NALOXONE (NARCAN) NASAL SPRAY 4 MG/0.1 ML    For excess sedation from opioids   NITROGLYCERIN (NITROSTAT) 0.3 MG SL TABLET    Place 1 tablet (0.3 mg total) under the tongue every 5 (five) minutes as needed for chest pain. Maximum 3 doses.   OXYCODONE HCL 10 MG TABS    Take 1 tablet (10 mg total) by mouth 4 (four) times daily.   OXYCODONE HCL 10 MG TABS    Take 1 tablet (10  mg total) by mouth 4 (four) times daily.   ROSUVASTATIN (CRESTOR) 5 MG TABLET    Take 5 mg by mouth daily.   SACUBITRIL-VALSARTAN (ENTRESTO) 24-26 MG    Take 1 tablet by mouth 2 (two) times daily.   TAMSULOSIN (FLOMAX) 0.4 MG CAPS CAPSULE    Take 1 capsule (0.4 mg total) by mouth daily.  Modified Medications   Modified Medication Previous Medication   CYCLOBENZAPRINE (FLEXERIL) 10 MG TABLET cyclobenzaprine (FLEXERIL) 10 MG tablet      Take 1 tablet (10 mg total) by mouth 2 (two) times daily.    Take 1 tablet (10 mg total) by mouth 2 (two) times daily.  Discontinued Medications   No medications on file   ----------------------------------------------------------------------------------------------------------------------  Follow-up: Return in about 2 months (around 07/24/2019) for evaluation, med refill.  Above Procedure: L5-S1 LESI with fluoroscopic guidance and no moderate sedation  NOTE: The risks, benefits, and expectations of the procedure have been discussed and explained to the patient who was understanding and in agreement with suggested treatment plan. No guarantees were made.  DESCRIPTION OF PROCEDURE: Lumbar epidural steroid injection with no IV Versed, EKG, blood pressure, pulse, and pulse oximetry monitoring. The procedure was performed with the patient in the prone position under fluoroscopic guidance.  Sterile prep x3 was initiated and I then injected subcutaneous lidocaine to the overlying  L5-S1 site after its fluoroscopic identifictation.  Using strict aseptic technique, I then advanced an 18-gauge Tuohy epidural needle in the midline using interlaminar approach via loss-of-resistance to saline technique. There was negative aspiration for heme or  CSF.  I then confirmed position with both AP and Lateral fluoroscan.  2 cc of I contrast dye were injected and a  total of 5 mL of Preservative-Free normal saline mixed with 40 mg of Kenalog and 1cc Ropicaine 0.2 percent were injected  incrementally via the  epidurally placed needle. The needle was removed. The patient tolerated the injection well and was convalesced and discharged to home in stable condition. Should the patient have any post procedure difficulty they have been instructed on how to contact us for assistance.    Molli Barrows, MD

## 2019-05-24 NOTE — Patient Instructions (Signed)
Pain Management Discharge Instructions  General Discharge Instructions :  If you need to reach your doctor call: Monday-Friday 8:00 am - 4:00 pm at [phone] or toll free [phone].  After clinic hours [phone] to have operator reach doctor.  Bring all of your medication bottles to all your appointments in the pain clinic.  To cancel or reschedule your appointment with Pain Management please remember to call 24 hours in advance to avoid a fee.  Refer to the educational materials which you have been given on: General Risks, I had my Procedure. Discharge Instructions, Post Sedation.  Post Procedure Instructions:  The drugs you were given will stay in your system until tomorrow, so for the next 24 hours you should not drive, make any legal decisions or drink any alcoholic beverages.  You may eat anything you prefer, but it is better to start with liquids then soups and crackers, and gradually work up to solid foods.  Please notify your doctor immediately if you have any unusual bleeding, trouble breathing or pain that is not related to your normal pain.  Depending on the type of procedure that was done, some parts of your body may feel week and/or numb.  This usually clears up by tonight or the next day.  Walk with the use of an assistive device or accompanied by an adult for the 24 hours.  You may use ice on the affected area for the first 24 hours.  Put ice in a Ziploc bag and cover with a towel and place against area 15 minutes on 15 minutes off.  You may switch to heat after 24 hours.Epidural Steroid Injection Patient Information  Description: The epidural space surrounds the nerves as they exit the spinal cord.  In some patients, the nerves can be compressed and inflamed by a bulging disc or a tight spinal canal (spinal stenosis).  By injecting steroids into the epidural space, we can bring irritated nerves into direct contact with a potentially helpful medication.  These  steroids act directly on the irritated nerves and can reduce swelling and inflammation which often leads to decreased pain.  Epidural steroids may be injected anywhere along the spine and from the neck to the low back depending upon the location of your pain.   After numbing the skin with local anesthetic (like Novocaine), a small needle is passed into the epidural space slowly.  You may experience a sensation of pressure while this is being done.  The entire block usually last less than 10 minutes.  Conditions which may be treated by epidural steroids:   Low back and leg pain  Neck and arm pain  Spinal stenosis  Post-laminectomy syndrome  Herpes zoster (shingles) pain  Pain from compression fractures  Preparation for the injection:  1. Do not eat any solid food or dairy products within 8 hours of your appointment.  2. You may drink clear liquids up to 3 hours before appointment.  Clear liquids include water, black coffee, juice or soda.  No milk or cream please. 3. You may take your regular medication, including pain medications, with a sip of water before your appointment  Diabetics should hold regular insulin (if taken separately) and take 1/2 normal NPH dos the morning of the procedure.  Carry some sugar containing items with you to your appointment. 4. A driver must accompany you and be prepared to drive you home after your procedure.  5. Bring all your current medications with your. 6. An IV may be inserted and   sedation may be given at the discretion of the physician.   7. A blood pressure cuff, EKG and other monitors will often be applied during the procedure.  Some patients may need to have extra oxygen administered for a short period. 8. You will be asked to provide medical information, including your allergies, prior to the procedure.  We must know immediately if you are taking blood thinners (like Coumadin/Warfarin)  Or if you are allergic to IV iodine contrast (dye). We must  know if you could possible be pregnant.  Possible side-effects:  Bleeding from needle site  Infection (rare, may require surgery)  Nerve injury (rare)  Numbness & tingling (temporary)  Difficulty urinating (rare, temporary)  Spinal headache ( a headache worse with upright posture)  Light -headedness (temporary)  Pain at injection site (several days)  Decreased blood pressure (temporary)  Weakness in arm/leg (temporary)  Pressure sensation in back/neck (temporary)  Call if you experience:  Fever/chills associated with headache or increased back/neck pain.  Headache worsened by an upright position.  New onset weakness or numbness of an extremity below the injection site  Hives or difficulty breathing (go to the emergency room)  Inflammation or drainage at the infection site  Severe back/neck pain  Any new symptoms which are concerning to you  Please note:  Although the local anesthetic injected can often make your back or neck feel good for several hours after the injection, the pain will likely return.  It takes 3-7 days for steroids to work in the epidural space.  You may not notice any pain relief for at least that one week.  If effective, we will often do a series of three injections spaced 3-6 weeks apart to maximally decrease your pain.  After the initial series, we generally will wait several months before considering a repeat injection of the same type.  If you have any questions, please call [phone] [facility] 

## 2019-05-26 ENCOUNTER — Other Ambulatory Visit: Payer: Self-pay

## 2019-05-26 MED ORDER — EZETIMIBE 10 MG PO TABS: 10.0000 mg | ORAL_TABLET | Freq: Every day | ORAL | 3 refills | Status: DC

## 2019-05-31 ENCOUNTER — Ambulatory Visit: Payer: Medicare Other | Admitting: Urology

## 2019-06-03 ENCOUNTER — Other Ambulatory Visit: Payer: Self-pay | Admitting: Family Medicine

## 2019-06-07 ENCOUNTER — Telehealth: Payer: Self-pay

## 2019-06-07 DIAGNOSIS — E1142 Type 2 diabetes mellitus with diabetic polyneuropathy: Secondary | ICD-10-CM | POA: Diagnosis not present

## 2019-06-07 DIAGNOSIS — R278 Other lack of coordination: Secondary | ICD-10-CM | POA: Diagnosis not present

## 2019-06-07 DIAGNOSIS — R531 Weakness: Secondary | ICD-10-CM | POA: Diagnosis not present

## 2019-06-07 DIAGNOSIS — G7000 Myasthenia gravis without (acute) exacerbation: Secondary | ICD-10-CM | POA: Diagnosis not present

## 2019-06-07 MED ORDER — SACUBITRIL-VALSARTAN 24-26 MG PO TABS: 1.0000 | ORAL_TABLET | Freq: Two times a day (BID) | ORAL | 1 refills | Status: DC

## 2019-06-07 NOTE — Telephone Encounter (Signed)
Refill sent for Entresto 24-26 mg tablets one tablet twice a day.

## 2019-06-14 ENCOUNTER — Ambulatory Visit (INDEPENDENT_AMBULATORY_CARE_PROVIDER_SITE_OTHER): Payer: Medicare Other | Admitting: Family Medicine

## 2019-06-14 ENCOUNTER — Encounter: Payer: Self-pay | Admitting: Family Medicine

## 2019-06-14 ENCOUNTER — Other Ambulatory Visit: Payer: Self-pay

## 2019-06-14 ENCOUNTER — Telehealth: Payer: Self-pay | Admitting: Cardiovascular Disease

## 2019-06-14 VITALS — BP 78/60 | HR 99 | Temp 96.9°F | Resp 16 | Ht 67.0 in | Wt 234.5 lb

## 2019-06-14 DIAGNOSIS — G7000 Myasthenia gravis without (acute) exacerbation: Secondary | ICD-10-CM

## 2019-06-14 DIAGNOSIS — I5022 Chronic systolic (congestive) heart failure: Secondary | ICD-10-CM | POA: Diagnosis not present

## 2019-06-14 DIAGNOSIS — Z794 Long term (current) use of insulin: Secondary | ICD-10-CM

## 2019-06-14 DIAGNOSIS — R339 Retention of urine, unspecified: Secondary | ICD-10-CM

## 2019-06-14 DIAGNOSIS — I700 Atherosclerosis of aorta: Secondary | ICD-10-CM | POA: Diagnosis not present

## 2019-06-14 DIAGNOSIS — E1142 Type 2 diabetes mellitus with diabetic polyneuropathy: Secondary | ICD-10-CM

## 2019-06-14 DIAGNOSIS — Z23 Encounter for immunization: Secondary | ICD-10-CM

## 2019-06-14 DIAGNOSIS — F4321 Adjustment disorder with depressed mood: Secondary | ICD-10-CM

## 2019-06-14 NOTE — Telephone Encounter (Signed)
Returned the patients call. [email or web address]. 

## 2019-06-14 NOTE — Progress Notes (Signed)
Name: [name]   MRN: [id number]    DOB: Dec 19, 1960   Date:06/14/2019       Progress Note  Subjective  Chief Complaint  Chief Complaint  Patient presents with  . Medication Refill    6 month F/U  . Myasthenia Gravis  . Diabetes Mellitus  . Asthma    Still seeing Dr. Raul Del  . Morbid Obesity  . Gait Problem    States he has been dizzy lately, Difficult walking has talked to Dr. Melrose Nakayama regarding this problem    HPI  CAD with angina/CHF/HTN:multiple stents in the past, he had a new stent placed July 2019, also diagnosed with CHF and is now on Entresto, metoprolol XL 100 mg Lasixprn onlyhe is also back on plavix and aspirin.  He has  dizziness when getting up also mild SOB and dizziness when walking around the house - he forgot to bring his cane today . He has been on disability because of heart disease - since 2010 . His last visit with Dr. Fletcher Anon was May 2020, he states he will contact him today to discuss dizziness, may need to adjust his medications  Acute hearing loss : Oct 2019 his fever resolved, CRP and sed rate back to normal,  still seeing Dr. Tommy Medal, still unable to hear from the left side- ENT was giving him steroid injections, he just ordered a hearing aid from [facility].   Hyperbaric treatment did not help and it was stopped. Initially thought to have been sepsis however neurologist at [facility] thinks secondary to reaction to Imuran   MG: seeing neurologist- Dr. Melrose Nakayama in [place] and also seen by Dr. Warnell Forester at [facility]  He has ptosis of left eyethroughout the day,but both sides at the end of the day. He has fatigue after activity. Stable on medications. He still uses a cane.Taking mediations as prescribed . He is seeking a second opinion, because last EMG at [facility] was negative and they are trying to do further testing    DMII : he has neuropathy, already on gabapentin given by Dr. Melrose Nakayama. HgbA1Cwent up to 7.9% done at Dr. Lenon Ahmadi August 2019, and last checked  04/2019 was 7.9%. Denies polyphagia, polydipsia or polyuria. Taking medication as prescribed. He is on Lantus, metformin and Victoza. He states his son and grandsons moved in with them and they buy junk food but they decided to separate their food from their children's food   Asthma: he sees pulmonologist Dr. Raul Del. He is using Breo daily also using rescue inhaler three times a day for the past few days. He states symptoms controlled as long as he uses medication. No cough, wheezing or SOB   Morbid obesity: based on co-morbidities.He states he wants to get DM under better control and plans on exercising more and avoid junk food   Inflammatory spondylopathy: continue follow up with pain clinic,has problems with gait, balance problems at times.He had injections done a couple of weeks ago, he states it worked well for 2 weeks, but states medication and stretching also helps. Pain level is 3/10 today , gets worse as the day progresses up to 7/10   Patient Active Problem List   Diagnosis Date Noted  . Idiopathic peripheral neuropathy 04/21/2019  . Colitis 07/08/2018  . OSA on CPAP 07/08/2018  . Unstable angina ([place]) 04/26/2018  . Ischemic cardiomyopathy 04/26/2018  . Chronic combined systolic and diastolic CHF (congestive heart failure) ([place]) 04/26/2018  . Effort angina ([place]) 04/25/2018  . Inflammatory spondylopathy of  lumbosacral region ([place]) 01/25/2018  . Hyperlipidemia due to type 2 diabetes mellitus ([place]) 08/12/2017  . Vitamin D deficiency, unspecified 08/12/2017  . Ptosis of left eyelid 01/08/2017  . Dermatitis 12/24/2016  . Difficulty walking 04/27/2016  . Foot cramps 04/27/2016  . Morbid (severe) obesity due to excess calories ([place]) 04/06/2016  . Benign neoplasm of sigmoid colon   . Benign neoplasm of descending colon   . Benign neoplasm of transverse colon   . Coronary artery disease involving native coronary artery with angina pectoris ([place]) 10/22/2015  . Coronary artery  disease 08/22/2015  . DM (diabetes mellitus), type 2, uncontrolled with complications ([place]) 60/73/7106  . Myasthenia gravis ([place]) 08/22/2015  . Chronic left-sided low back pain 08/22/2015  . Hypertension 08/22/2015  . GERD (gastroesophageal reflux disease) 08/22/2015  . Arthritis 08/22/2015  . Diabetic peripheral neuropathy ([place]) 08/22/2015  . Hyperlipidemia 08/22/2015  . Insomnia 08/22/2015  . Type 2 diabetes mellitus with diabetic polyneuropathy, with long-term current use of insulin ([place]) 08/22/2015    Past Surgical History:  Procedure Laterality Date  . BILATERAL CARPAL TUNNEL RELEASE Bilateral L in 2012 and R in 2013  . CARDIAC CATHETERIZATION     Several Caths, most recent in  March 2016.  [name] COLONOSCOPY WITH PROPOFOL N/A 01/10/2016   Procedure: COLONOSCOPY WITH PROPOFOL;  Surgeon: [name], MD;  Location: [facility] ENDOSCOPY;  Service: Endoscopy;  Laterality: N/A;  . CORONARY ANGIOPLASTY    . CORONARY STENT INTERVENTION N/A 04/25/2018   Procedure: CORONARY STENT INTERVENTION;  Surgeon: [name], MD;  Location: [place] [facility];  Service: Cardiovascular;  Laterality: N/A;  . ESOPHAGOGASTRODUODENOSCOPY (EGD) WITH PROPOFOL N/A 01/10/2016   Procedure: ESOPHAGOGASTRODUODENOSCOPY (EGD) WITH PROPOFOL;  Surgeon: [name], MD;  Location: [facility] ENDOSCOPY;  Service: Endoscopy;  Laterality: N/A;  . EYE SURGERY Bilateral 2012   cataract/bilateral vitrectomies  . LEFT HEART CATH AND CORONARY ANGIOGRAPHY Left 04/25/2018   Procedure: LEFT HEART CATH AND CORONARY ANGIOGRAPHY;  Surgeon: [name], MD;  Location: [place] CV LAB;  Service: Cardiovascular;  Laterality: Left;  . TEE WITHOUT CARDIOVERSION N/A 09/05/2018   Procedure: TRANSESOPHAGEAL ECHOCARDIOGRAM (TEE);  Surgeon: [name], MD;  Location: [facility] ORS;  Service: Cardiovascular;  Laterality: N/A;  . TONSILLECTOMY AND ADENOIDECTOMY     As a child  . TUNNELED VENOUS CATHETER PLACEMENT     removed    Family  History  Problem Relation Age of Onset  . Diabetes Mother   . Heart disease Mother   . Cancer Father        Prostate CA  . Dementia Father   . Diabetes Brother   . Healthy Brother   . Healthy Brother     Social History   Socioeconomic History  . Marital status: Married    Spouse name: Neoma Laming  . Number of children: 0  . Years of education: Not on file  . Highest education level: Master's degree (e.g., MA, MS, MEng, MEd, MSW, MBA)  Occupational History  . Occupation: disabled    Comment: multiple medical problems - first approved for uncontrolled DM, but now has heart disease and Myasthenia Gravis   Social Needs  . Financial resource strain: Not hard at all  . Food insecurity    Worry: Never true    Inability: Never true  . Transportation needs    Medical: No    Non-medical: No  Tobacco Use  . Smoking status: Never Smoker  . Smokeless tobacco: Never Used  . Tobacco comment:  smoking cessation materials not required  Substance and Sexual Activity  . Alcohol use: Not Currently    Alcohol/week: 0.0 standard drinks  . Drug use: No  . Sexual activity: Not Currently    Partners: Female  Lifestyle  . Physical activity    Days per week: 7 days    Minutes per session: 30 min  . Stress: Not at all  Relationships  . Social connections    Talks on phone: More than three times a week    Gets together: Three times a week    Attends religious service: More than 4 times per year    Active member of club or organization: Yes    Attends meetings of clubs or organizations: More than 4 times per year    Relationship status: Married  . Intimate partner violence    Fear of current or ex partner: No    Emotionally abused: No    Physically abused: No    Forced sexual activity: No  Other Topics Concern  . Not on file  Social History Narrative  . Not on file     Current Outpatient Medications:  .  albuterol (PROVENTIL HFA;VENTOLIN HFA) 108 (90 Base) MCG/ACT inhaler, Inhale 2  puffs into the lungs every 6 (six) hours as needed for wheezing or shortness of breath., Disp: , Rfl:  .  albuterol (PROVENTIL) (2.5 MG/3ML) 0.083% nebulizer solution, Take 2.5 mg by nebulization every 6 (six) hours as needed for wheezing or shortness of breath., Disp: , Rfl:  .  aspirin EC 81 MG tablet, Take 1 tablet (81 mg total) by mouth daily., Disp: 90 tablet, Rfl: 3 .  cholecalciferol (VITAMIN D3) 25 MCG (1000 UT) tablet, Take 2,000 Units by mouth daily., Disp: , Rfl:  .  clopidogrel (PLAVIX) 75 MG tablet, Take 1 tablet (75 mg total) by mouth daily with breakfast., Disp: 30 tablet, Rfl: 6 .  cyanocobalamin 1000 MCG tablet, Take 1,000 mcg by mouth daily. , Disp: , Rfl:  .  cyclobenzaprine (FLEXERIL) 10 MG tablet, Take 1 tablet (10 mg total) by mouth 2 (two) times daily., Disp: 60 tablet, Rfl: 2 .  DEXILANT 60 MG capsule, TAKE ONE CAPSULE BY MOUTH DAILY, Disp: 30 capsule, Rfl: 11 .  ezetimibe (ZETIA) 10 MG tablet, Take 1 tablet (10 mg total) by mouth daily., Disp: 90 tablet, Rfl: 3 .  fluticasone furoate-vilanterol (BREO ELLIPTA) 200-25 MCG/INH AEPB, Inhale 1 puff into the lungs daily., Disp: 3 each, Rfl: 1 .  furosemide (LASIX) 20 MG tablet, Take 20 mg by mouth every other day., Disp: , Rfl:  .  gabapentin (NEURONTIN) 300 MG capsule, Take 1 capsule (300 mg total) by mouth 3 (three) times daily., Disp: 90 capsule, Rfl: 1 .  gabapentin (NEURONTIN) 800 MG tablet, Take 800 mg by mouth at bedtime., Disp: , Rfl:  .  insulin lispro (HUMALOG KWIKPEN) 100 UNIT/ML KiwkPen, Inject 7-8 Units into the skin 3 (three) times daily. Reported on 12/02/2015/ sliding scale 1 unit for every 8 units of carbs; and 1 unit for every 20 above 120, Disp: , Rfl:  .  Insulin Pen Needle (FIFTY50 PEN NEEDLES) 32G X 4 MM MISC, Inject 1 each into the skin as needed., Disp: , Rfl:  .  LANTUS SOLOSTAR 100 UNIT/ML Solostar Pen, Inject 25-30 Units into the skin daily at 10 pm. , Disp: , Rfl: 0 .  levocetirizine (XYZAL) 5 MG tablet,  TAKE 1 TABLET(5 MG) BY MOUTH EVERY EVENING, Disp: 30 tablet, Rfl: 3 .  liraglutide (VICTOZA) 18 MG/3ML SOPN, 1.8 units every morning (Patient taking differently: Inject 1.8 mg into the skin daily. ), Disp: , Rfl:  .  loratadine (CLARITIN) 10 MG tablet, Take 10 mg by mouth daily. , Disp: , Rfl:  .  metFORMIN (GLUCOPHAGE) 1000 MG tablet, Take 1,000 mg by mouth 2 (two) times daily with a meal., Disp: , Rfl:  .  montelukast (SINGULAIR) 10 MG tablet, Take 10 mg by mouth at bedtime., Disp: , Rfl:  .  naloxone (NARCAN) nasal spray 4 mg/0.1 mL, For excess sedation from opioids, Disp: 1 kit, Rfl: 2 .  nitroGLYCERIN (NITROSTAT) 0.3 MG SL tablet, Place 1 tablet (0.3 mg total) under the tongue every 5 (five) minutes as needed for chest pain. Maximum 3 doses., Disp: 25 tablet, Rfl: 3 .  Oxycodone HCl 10 MG TABS, Take 1 tablet (10 mg total) by mouth 4 (four) times daily., Disp: 120 tablet, Rfl: 0 .  [START ON 06/18/2019] Oxycodone HCl 10 MG TABS, Take 1 tablet (10 mg total) by mouth 4 (four) times daily., Disp: 120 tablet, Rfl: 0 .  rosuvastatin (CRESTOR) 5 MG tablet, Take 5 mg by mouth daily., Disp: , Rfl: 0 .  sacubitril-valsartan (ENTRESTO) 24-26 MG, Take 1 tablet by mouth 2 (two) times daily., Disp: 60 tablet, Rfl: 1 .  tamsulosin (FLOMAX) 0.4 MG CAPS capsule, TAKE 1 CAPSULE(0.4 MG) BY MOUTH DAILY, Disp: 30 capsule, Rfl: 0 .  metoprolol succinate (TOPROL-XL) 100 MG 24 hr tablet, Take 1 tablet (100 mg total) by mouth daily. Take with or immediately following a meal., Disp: 30 tablet, Rfl: 3  Allergies  Allergen Reactions  . Azathioprine Other (See Comments)    Azathioprine hypersensitivity reaction  . Novolog [Insulin Aspart] Hives    I personally reviewed active problem list, medication list, allergies, family history, social history, health maintenance with the patient/caregiver today.   ROS  Constitutional: Negative for fever or weight change.  Respiratory: Negative for cough and shortness of  breath.   Cardiovascular: Negative for chest pain or palpitations.  Gastrointestinal: Negative for abdominal pain, no bowel changes.  Musculoskeletal: Positive for gait problem but no  joint swelling.  Skin: Negative for rash.  Neurological: Positive for dizziness but no  headache.  No other specific complaints in a complete review of systems (except as listed in HPI above).  Objective  Vitals:   06/14/19 1029  BP: (!) 78/60  Pulse: 99  Resp: 16  Temp: (!) 96.9 F (36.1 C)  TempSrc: Temporal  SpO2: 98%  Weight: 234 lb 8 oz (106.4 kg)  Height: 5' 7" (1.702 m)    Body mass index is 36.73 kg/m.  Physical Exam  Constitutional: Patient appears well-developed and well-nourished. Obese  No distress.  HEENT: head atraumatic, normocephalic, pupils equal and reactive to light, ptosis of left eyelid  Cardiovascular: Normal rate, regular rhythm and normal heart sounds.  No murmur heard. No BLE edema. Pulmonary/Chest: Eff[place] normal and breath sounds normal. No respiratory distress. Abdominal: Soft.  There is no tenderness. Psychiatric: Patient has a normal mood and affect. behavior is normal. Judgment and thought content normal.  Recent Results (from the past 2160 hour(s))  POCT urinalysis dipstick     Status: Normal   Collection Time: 05/05/19  9:30 AM  Result Value Ref Range   Color, UA amber    Clarity, UA clear    Glucose, UA Negative Negative   Bilirubin, UA neg    Ketones, UA neg    Spec Grav, UA  1.015 1.010 - 1.025   Blood, UA neg    pH, UA 5.0 5.0 - 8.0   Protein, UA Negative Negative   Urobilinogen, UA 0.2 0.2 or 1.0 E.U./dL   Nitrite, UA neg    Leukocytes, UA Negative Negative   Appearance     Odor    COMPLETE METABOLIC PANEL WITH GFR     Status: Abnormal   Collection Time: 05/05/19 10:12 AM  Result Value Ref Range   Glucose, Bld 177 (H) 65 - 99 mg/dL    Comment: .            Fasting reference interval . For someone without known diabetes, a glucose value  >125 mg/dL indicates that they may have diabetes and this should be confirmed with a follow-up test. .    BUN 33 (H) 7 - 25 mg/dL   Creat 1.54 (H) 0.70 - 1.33 mg/dL    Comment: For patients >42 years of age, the reference limit for Creatinine is approximately 13% higher for people identified as African-American. .    GFR, Est Non African American 49 (L) > OR = 60 mL/min/1.37m   GFR, Est African American 56 (L) > OR = 60 mL/min/1.772m  BUN/Creatinine Ratio 21 6 - 22 (calc)   Sodium 136 135 - 146 mmol/L   Potassium 5.1 3.5 - 5.3 mmol/L   Chloride 103 98 - 110 mmol/L   CO2 24 20 - 32 mmol/L   Calcium 9.7 8.6 - 10.3 mg/dL   Total Protein 5.9 (L) 6.1 - 8.1 g/dL   Albumin 4.0 3.6 - 5.1 g/dL   Globulin 1.9 1.9 - 3.7 g/dL (calc)   AG Ratio 2.1 1.0 - 2.5 (calc)   Total Bilirubin 0.4 0.2 - 1.2 mg/dL   Alkaline phosphatase (APISO) 63 35 - 144 U/L   AST 16 10 - 35 U/L   ALT 14 9 - 46 U/L  Hemoglobin A1c     Status: Abnormal   Collection Time: 05/05/19 10:12 AM  Result Value Ref Range   Hgb A1c MFr Bld 7.9 (H) <5.7 % of total Hgb    Comment: For someone without known diabetes, a hemoglobin A1c value of 6.5% or greater indicates that they may have  diabetes and this should be confirmed with a follow-up  test. . For someone with known diabetes, a value <7% indicates  that their diabetes is well controlled and a value  greater than or equal to 7% indicates suboptimal  control. A1c targets should be individualized based on  duration of diabetes, age, comorbid conditions, and  other considerations. . Currently, no consensus exists regarding use of hemoglobin A1c for diagnosis of diabetes for children. .    Mean Plasma Glucose 180 (calc)   eAG (mmol/L) 10.0 (calc)  Microalbumin / creatinine urine ratio     Status: None   Collection Time: 05/05/19 10:12 AM  Result Value Ref Range   Creatinine, Urine 172 20 - 320 mg/dL   Microalb, Ur 1.4 mg/dL    Comment: Reference Range Not  established    Microalb Creat Ratio 8 <30 mcg/mg creat    Comment: . The ADA defines abnormalities in albumin excretion as follows: . Marland Kitchenategory         Result (mcg/mg creatinine) . Normal                    <30 Microalbuminuria         30-299  Clinical albuminuria   > OR =  300 . The ADA recommends that at least two of three specimens collected within a 3-6 month period be abnormal before considering a patient to be within a diagnostic category.   PSA     Status: None   Collection Time: 05/05/19 10:12 AM  Result Value Ref Range   PSA 0.7 < OR = 4.0 ng/mL    Comment: The total PSA value from this assay system is  standardized against the WHO standard. The test  result will be approximately 20% lower when compared  to the equimolar-standardized total PSA (Beckman  Coulter). Comparison of serial PSA results should be  interpreted with this fact in mind. . This test was performed using the Siemens  chemiluminescent method. Values obtained from  different assay methods cannot be used interchangeably. PSA levels, regardless of value, should not be interpreted as absolute evidence of the presence or absence of disease.     Diabetic Foot Exam: Diabetic Foot Exam - Simple   Simple Foot Form Diabetic Foot exam was performed with the following findings: Yes 06/14/2019 11:47 AM  Visual Inspection No deformities, no ulcerations, no other skin breakdown bilaterally: Yes Sensation Testing Intact to touch and monofilament testing bilaterally: Yes Pulse Check Posterior Tibialis and Dorsalis pulse intact bilaterally: Yes Comments      PHQ2/9: Depression screen [facility] 2/9 06/14/2019 05/05/2019 12/12/2018 11/17/2018 09/29/2018  Decreased Interest 1 0 0 0 1  Down, Depressed, Hopeless 1 0 0 0 1  PHQ - 2 Score 2 0 0 0 2  Altered sleeping 2 0 1 - 0  Tired, decreased energy 2 0 1 - 3  Change in appetite 3 0 1 - 2  Feeling bad or failure about yourself  1 0 0 - 1  Trouble concentrating 0 0 0 -  0  Moving slowly or fidgety/restless 1 0 0 - 0  Suicidal thoughts 0 0 0 - 0  PHQ-9 Score 11 0 3 - 8  Difficult doing work/chores Somewhat difficult Not difficult at all Not difficult at all - Somewhat difficult  Some recent data might be hidden    phq 9 is positive   Fall Risk: Fall Risk  06/14/2019 05/24/2019 05/05/2019 12/12/2018 11/17/2018  Falls in the past year? 0 0 0 0 0  Comment - - - - -  Number falls in past yr: 0 - 0 0 -  Comment - - - - -  Injury with Fall? 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 Type 2 diabetes mellitus with diabetic polyneuropathy, with long-term current use of insulin (HCC)  He is going to change his diet and increase physical activity  2. Encounter for immunization  - Flu Vaccine MDCK QUAD PF  3. Atherosclerosis of aorta ([place])  On  statin therapy   4. Chronic systolic CHF (congestive heart failure) (HCC)  On Entresto and lasix  5. Myasthenia gravis ([place])  Under the care of neurologist   6. Situational depression  He wants to see his father that lives in [place] and also unable to go to got-mothers funeral that is in [place] because of COVID-19  7. Urinary retention  Doing well on flomax waiting to see Urologist

## 2019-06-14 NOTE — Telephone Encounter (Signed)
Switch to Entresto to losartan 25 mg once daily and continue monitoring blood pressure.

## 2019-06-14 NOTE — Telephone Encounter (Signed)
Spoke with the patient. Patient sts that typical his BP runs in the 110s/70s. The last couple of week his systolic and diastolic have been running 20 points lower. Patients that he has been having dizzy spells when he changes positions and with exertion. He is taking his medication as listed.  Lasix 20mg  every other day. Metoprolol XL 100mg  daily. Entresto 24/26 bid.  He adheres to a 64oz fluid restriction. He was told by his pcp in July that his lab results showed that he was mildly dehydrated.  Patient was seen today by his pcp and instructed to contact Dr. Tyrell Antonio regarding the dizziness and possible need for medication adjustment.  BP @ pcp office 80/60 99bpm Patient rechecked when he returned home 80/45 103bpm Patient rechecked while I held the line 91/56 100bpm  The patient takes his medications in the morning. Adv him to take just 1/2 of his Metoprolol in the morning. Adv the patient that I will fwd an update to Dr. Fletcher Anon, since it is close to 5pm  I will not have a response today. Adv the patient that I will call back with Dr. Tyrell Antonio recommendation.  Patient is agreeable with the plan and voiced appreciation for the call back.

## 2019-06-14 NOTE — Telephone Encounter (Signed)
STAT if patient feels like he/she is going to faint   1) Are you dizzy now? No   2) Do you feel faint or have you passed out? Felt closing in no fainting or syncope   3) Do you have any other symptoms? When standing up lightheaded bp 80/60 slight sob   4) Have you checked your HR and BP (record if available)?  BP  80/60  HR 99      Patient states he is taking 10 mg po q d lasix

## 2019-06-14 NOTE — Telephone Encounter (Signed)
Patient returning call.

## 2019-06-15 DIAGNOSIS — Z794 Long term (current) use of insulin: Secondary | ICD-10-CM | POA: Diagnosis not present

## 2019-06-15 DIAGNOSIS — E1165 Type 2 diabetes mellitus with hyperglycemia: Secondary | ICD-10-CM | POA: Diagnosis not present

## 2019-06-15 MED ORDER — LOSARTAN POTASSIUM 25 MG PO TABS: 25.0000 mg | ORAL_TABLET | Freq: Every day | ORAL | 5 refills | Status: DC

## 2019-06-15 NOTE — Telephone Encounter (Signed)
Called to give the patient Dr. Arida's recommendation. [email or web address]. 

## 2019-06-15 NOTE — Telephone Encounter (Signed)
Patient returning call, please advise when able 

## 2019-06-15 NOTE — Telephone Encounter (Signed)
Called the patient. [email or web address]. 

## 2019-06-15 NOTE — Telephone Encounter (Signed)
Patient made aware of Dr. Tyrell Antonio recommendation. Stop Entresto start Losartan 25mg  daily. Rx sent to the patient's pharmacy. Pt adv to continue monitoring his BP. He is to contact the office if his BP is consistently low/high or if dizziness persist. Pt verbalized understanding and voiced appreciation for call.

## 2019-06-16 ENCOUNTER — Other Ambulatory Visit: Payer: Self-pay | Admitting: Cardiovascular Disease

## 2019-06-21 DIAGNOSIS — R0600 Dyspnea, unspecified: Secondary | ICD-10-CM | POA: Diagnosis not present

## 2019-06-21 DIAGNOSIS — G4733 Obstructive sleep apnea (adult) (pediatric): Secondary | ICD-10-CM | POA: Diagnosis not present

## 2019-06-21 DIAGNOSIS — J439 Emphysema, unspecified: Secondary | ICD-10-CM | POA: Diagnosis not present

## 2019-06-21 DIAGNOSIS — R531 Weakness: Secondary | ICD-10-CM | POA: Insufficient documentation

## 2019-06-27 ENCOUNTER — Other Ambulatory Visit: Payer: Self-pay

## 2019-06-27 DIAGNOSIS — J302 Other seasonal allergic rhinitis: Secondary | ICD-10-CM

## 2019-06-27 MED ORDER — LEVOCETIRIZINE DIHYDROCHLORIDE 5 MG PO TABS: ORAL_TABLET | ORAL | 3 refills | Status: DC

## 2019-07-05 ENCOUNTER — Other Ambulatory Visit: Payer: Self-pay | Admitting: Family Medicine

## 2019-07-07 DIAGNOSIS — H905 Unspecified sensorineural hearing loss: Secondary | ICD-10-CM | POA: Diagnosis not present

## 2019-07-10 DIAGNOSIS — R946 Abnormal results of thyroid function studies: Secondary | ICD-10-CM | POA: Diagnosis not present

## 2019-07-10 DIAGNOSIS — G4733 Obstructive sleep apnea (adult) (pediatric): Secondary | ICD-10-CM | POA: Diagnosis not present

## 2019-07-10 DIAGNOSIS — Z9989 Dependence on other enabling machines and devices: Secondary | ICD-10-CM | POA: Diagnosis not present

## 2019-07-10 DIAGNOSIS — G7000 Myasthenia gravis without (acute) exacerbation: Secondary | ICD-10-CM | POA: Diagnosis not present

## 2019-07-10 DIAGNOSIS — E1140 Type 2 diabetes mellitus with diabetic neuropathy, unspecified: Secondary | ICD-10-CM | POA: Diagnosis not present

## 2019-07-11 ENCOUNTER — Encounter: Payer: Self-pay | Admitting: Family Medicine

## 2019-07-12 ENCOUNTER — Other Ambulatory Visit: Payer: Self-pay | Admitting: Family Medicine

## 2019-07-12 DIAGNOSIS — G5603 Carpal tunnel syndrome, bilateral upper limbs: Secondary | ICD-10-CM | POA: Insufficient documentation

## 2019-07-12 DIAGNOSIS — E063 Autoimmune thyroiditis: Secondary | ICD-10-CM

## 2019-07-12 MED ORDER — LEVOTHYROXINE SODIUM 25 MCG PO TABS: 25.0000 ug | ORAL_TABLET | Freq: Every day | ORAL | 1 refills | Status: DC

## 2019-07-17 ENCOUNTER — Other Ambulatory Visit: Payer: Self-pay

## 2019-07-17 MED ORDER — CLOPIDOGREL BISULFATE 75 MG PO TABS: 75.0000 mg | ORAL_TABLET | Freq: Every day | ORAL | 6 refills | Status: DC

## 2019-07-18 ENCOUNTER — Other Ambulatory Visit: Payer: Self-pay

## 2019-07-18 ENCOUNTER — Ambulatory Visit (INDEPENDENT_AMBULATORY_CARE_PROVIDER_SITE_OTHER): Payer: Medicare Other

## 2019-07-18 VITALS — BP 142/84 | HR 87 | Temp 96.8°F | Resp 16 | Ht 67.0 in | Wt 241.3 lb

## 2019-07-18 DIAGNOSIS — Z0000 Encounter for general adult medical examination without abnormal findings: Secondary | ICD-10-CM | POA: Diagnosis not present

## 2019-07-18 DIAGNOSIS — R4589 Other symptoms and signs involving emotional state: Secondary | ICD-10-CM | POA: Diagnosis not present

## 2019-07-18 NOTE — Progress Notes (Signed)
Subjective:   [name] is a 58 y.o. male who presents for Medicare Annual/Subsequent preventive examination.  Review of Systems:     Cardiac Risk Factors include: advanced age (>81men, >4 women);diabetes mellitus;dyslipidemia;obesity (BMI >30kg/m2);male gender;hypertension     Objective:    Vitals: BP (!) 142/84 (BP Location: Left Arm, Patient Position: Sitting, Cuff Size: Normal)    Pulse 87    Temp (!) 96.8 F (36 C) (Temporal)    Resp 16    Ht 5\' 7"  (1.702 m)    Wt 241 lb 4.8 oz (109.5 kg)    SpO2 96%    BMI 37.79 kg/m   Body mass index is 37.79 kg/m.  Advanced Directives 07/18/2019 09/05/2018 07/21/2018 07/20/2018 07/08/2018 05/12/2018 04/25/2018  Does Patient Have a Medical Advance Directive? Yes No Yes Yes Yes Yes No  Type of Paramedic of [place];Living will - Living will;Healthcare Power of Attorney Living will Living will [place];Living will -  Does patient want to make changes to medical advance directive? Yes (MAU/Ambulatory/Procedural Areas - Information given) - No - Patient declined - No - Patient declined - -  Copy of [place] in Chart? - - No - copy requested - - - -  Would patient like information on creating a medical advance directive? - No - Patient declined - - - - No - Patient declined    Tobacco Social History   Tobacco Use  Smoking Status Never Smoker  Smokeless Tobacco Never Used  Tobacco Comment   smoking cessation materials not required     Counseling given: Not Answered Comment: smoking cessation materials not required   Clinical Intake:  Pre-visit preparation completed: Yes  Pain : 0-10 Pain Score: 5  Pain Type: Chronic pain Pain Location: Back Pain Orientation: Lower Pain Descriptors / Indicators: Aching, Discomfort, Sore Pain Onset: More than a month ago Pain Frequency: Constant     BMI - recorded: 37.79 Nutritional Status: BMI > 30  Obese Nutritional Risks:  None Diabetes: Yes CBG done?: No CBG resulted in Enter/ Edit results?: No Did pt. bring in CBG monitor from home?: No   Nutrition Risk Assessment:  Has the patient had any N/V/D within the last 2 months?  Yes  - diarrhea related to food intolerance Does the patient have any non-healing wounds?  No  Has the patient had any unintentional weight loss or weight gain?  No   Diabetes:  Is the patient diabetic?  Yes  If diabetic, was a CBG obtained today?  No  Did the patient bring in their glucometer from home?  No  How often do you monitor your CBG's? 4 times daily, uses freestyle [place] system.   Financial Strains and Diabetes Management:  Are you having any financial strains with the device, your supplies or your medication? No .  Does the patient want to be seen by Chronic Care Management for management of their diabetes?  No  Would the patient like to be referred to a Nutritionist or for Diabetic Management?  No   Diabetic Exams:  Diabetic Eye Exam: Completed 05/17/18 positive retinopathy. Overdue for diabetic eye exam. Pt has been advised about the importance in completing this exam. Pt scheduled for later this month, appt postponed due to Covid.   Diabetic Foot Exam: Completed 06/14/19.   How often do you need to have someone help you when you read instructions, pamphlets, or other written materials from your doctor or pharmacy?: 1 -  Never  Interpreter Needed?: No  Information entered by :: Clemetine Marker LPN  Past Medical History:  Diagnosis Date   Allergy    dust, seasonal (worse in the fall).   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 Followed by Pain Specialist in [place], back and neck   Asthma    BRONCHITIS   Cataract    First Dx in 2012   Chronic combined systolic and diastolic congestive heart failure ([place])    a. 03/2018 Echo: EF 30-35%, ant, antlat, apical AK, Gr1 DD; b. 07/2018 Echo: EF 35-40%, anteroseptal, apical, and ant HK. Gr1 DD; c. 09/2019 TEE: EF 40-45%.   Coronary  artery disease    a. Prior Ant MI->s/p multiple stents placed in the LAD and right coronary artery ([place]); b. 2016 Cath: reportedly nonobs dzs;  c. 04/2018 Cath/PCI: LM nl, LAD 20p, patent mid stent, LCX 40m(3.25x15 [place] DES), OM1 nl, OM2 50, OM3 40 w/ patent stent, RCA 40p, 13m, 40d w/ patent stent in RPDA, RPAV 60, EF 25-35%. 2+MR; d. 02/2019 MV: Apical scar, no isch, EF 30-44%.   Diabetes mellitus without complication ([place])    TYPE 2   Diabetic peripheral neuropathy (HCC)    feet and hands   FUO (fever of unknown origin) 08/03/2018   GERD (gastroesophageal reflux disease)    Headache    muscle tension   Hyperlipidemia    Hypertension    CONTROLLED ON MEDS   Insomnia    Ischemic cardiomyopathy    a. 03/2018 Echo: EF 30-35%, ant, antlat, apical AK, Gr1 DD, mild MR, mildly dil LA; b. 07/2018 Echo: EF 35-40%; c. 09/2018 TEE: EF 40-45%, antsept, ant HK.   Lyme disease    Chronic   Myasthenia gravis ([place])    Myocardial infarction ([place]) 2010   Seasonal allergies    Sepsis ([place])    a.07/2018 - unknown source. TEE neg for veg 09/2019.   Sleep apnea    CPAP   Past Surgical History:  Procedure Laterality Date   BILATERAL CARPAL TUNNEL RELEASE Bilateral L in 2012 and R in 2013   [place], most recent in  March 2016.   COLONOSCOPY WITH PROPOFOL N/A 01/10/2016   Procedure: COLONOSCOPY WITH PROPOFOL;  Surgeon: [name], MD;  Location: [facility];  Service: Endoscopy;  Laterality: N/A;   CORONARY ANGIOPLASTY     CORONARY STENT INTERVENTION N/A 04/25/2018   Procedure: CORONARY STENT INTERVENTION;  Surgeon: [name], MD;  Location: [facility];  Service: Cardiovascular;  Laterality: N/A;   ESOPHAGOGASTRODUODENOSCOPY (EGD) WITH PROPOFOL N/A 01/10/2016   Procedure: ESOPHAGOGASTRODUODENOSCOPY (EGD) WITH PROPOFOL;  Surgeon: [name], MD;  Location: [facility]COPY;  Service: Endoscopy;  Laterality: N/A;   EYE SURGERY  Bilateral 2012   cataract/bilateral vitrectomies   LEFT HEART CATH AND CORONARY ANGIOGRAPHY Left 04/25/2018   Procedure: LEFT HEART CATH AND CORONARY ANGIOGRAPHY;  Surgeon: [name], MD;  Location: [facility];  Service: Cardiovascular;  Laterality: Left;   TEE WITHOUT CARDIOVERSION N/A 09/05/2018   Procedure: TRANSESOPHAGEAL ECHOCARDIOGRAM (TEE);  Surgeon: [name], MD;  Location: [facility];  Service: Cardiovascular;  Laterality: N/A;   TONSILLECTOMY AND ADENOIDECTOMY     As a child   TUNNELED VENOUS CATHETER PLACEMENT     removed   Family History  Problem Relation Age of Onset   Diabetes Mother    Heart disease Mother    Cancer Father  Prostate CA   Dementia Father    Diabetes Brother    Healthy Brother    Healthy Brother    Social History   Socioeconomic History   Marital status: Married    Spouse name: Neoma Laming   Number of children: 0   Years of education: Not on file   Highest education level: Master's degree (e.g., MA, MS, MEng, MEd, MSW, MBA)  Occupational History   Occupation: disabled    Comment: multiple medical problems - first approved for uncontrolled DM, but now has heart disease and Myasthenia Gravis   Social Designer, fashion/clothing strain: Not hard at all   Food insecurity    Worry: Never true    Inability: Never true   Transportation needs    Medical: No    Non-medical: No  Tobacco Use   Smoking status: Never Smoker   Smokeless tobacco: Never Used   Tobacco comment: smoking cessation materials not required  Substance and Sexual Activity   Alcohol use: Not Currently    Alcohol/week: 0.0 standard drinks   Drug use: No   Sexual activity: Not Currently    Partners: Female  Lifestyle   Physical activity    Days per week: 7 days    Minutes per session: 30 min   Stress: Only a little  Relationships   Social connections    Talks on phone: More than three times a week    Gets together: Three  times a week    Attends religious service: More than 4 times per year    Active member of club or organization: Yes    Attends meetings of clubs or organizations: More than 4 times per year    Relationship status: Married  Other Topics Concern   Not on file  Social History Narrative   Not on file    Outpatient Encounter Medications as of 07/18/2019  Medication Sig   albuterol (PROVENTIL HFA;VENTOLIN HFA) 108 (90 Base) MCG/ACT inhaler Inhale 2 puffs into the lungs every 6 (six) hours as needed for wheezing or shortness of breath.   albuterol (PROVENTIL) (2.5 MG/3ML) 0.083% nebulizer solution Take 2.5 mg by nebulization every 6 (six) hours as needed for wheezing or shortness of breath.   aspirin EC 81 MG tablet Take 1 tablet (81 mg total) by mouth daily.   betamethasone valerate ointment (VALISONE) 0.1 % Apply topically.   cholecalciferol (VITAMIN D3) 25 MCG (1000 UT) tablet Take 2,000 Units by mouth daily.   clopidogrel (PLAVIX) 75 MG tablet Take 1 tablet (75 mg total) by mouth daily with breakfast.   cyanocobalamin 1000 MCG tablet Take 1,000 mcg by mouth daily.    cyclobenzaprine (FLEXERIL) 10 MG tablet Take 1 tablet by mouth 2 (two) times daily.   DEXILANT 60 MG capsule TAKE ONE CAPSULE BY MOUTH DAILY   ezetimibe (ZETIA) 10 MG tablet Take 1 tablet (10 mg total) by mouth daily.   fluticasone (FLOVENT HFA) 220 MCG/ACT inhaler Inhale into the lungs.   fluticasone furoate-vilanterol (BREO ELLIPTA) 200-25 MCG/INH AEPB Inhale 1 puff into the lungs daily.   furosemide (LASIX) 20 MG tablet Take 20 mg by mouth every other day.   gabapentin (NEURONTIN) 300 MG capsule Take 1 capsule (300 mg total) by mouth 3 (three) times daily.   gabapentin (NEURONTIN) 800 MG tablet Take 800 mg by mouth at bedtime.   insulin lispro (HUMALOG KWIKPEN) 100 UNIT/ML KiwkPen Inject 7-8 Units into the skin 3 (three) times daily. Reported on 12/02/2015/ sliding scale 1  unit for every 8 units of carbs;  and 1 unit for every 20 above 120   Insulin Pen Needle (FIFTY50 PEN NEEDLES) 32G X 4 MM MISC Inject 1 each into the skin as needed.   LANTUS SOLOSTAR 100 UNIT/ML Solostar Pen Inject 25-30 Units into the skin daily at 10 pm.    levocetirizine (XYZAL) 5 MG tablet TAKE 1 TABLET(5 MG) BY MOUTH EVERY EVENING   levothyroxine (SYNTHROID) 25 MCG tablet Take 1 tablet (25 mcg total) by mouth daily before breakfast.   liraglutide (VICTOZA) 18 MG/3ML SOPN 1.8 units every morning (Patient taking differently: Inject 1.8 mg into the skin daily. )   loratadine (CLARITIN) 10 MG tablet Take 10 mg by mouth daily.    losartan (COZAAR) 25 MG tablet Take 1 tablet (25 mg total) by mouth daily.   metFORMIN (GLUCOPHAGE) 1000 MG tablet Take 1,000 mg by mouth 2 (two) times daily with a meal.   metoprolol tartrate (LOPRESSOR) 50 MG tablet Take 1 tablet by mouth 2 (two) times daily.   montelukast (SINGULAIR) 10 MG tablet Take 10 mg by mouth at bedtime.   Multiple Vitamins-Minerals (MENS MULTI VITAMIN & MINERAL PO) Take 1 tablet by mouth daily.   naloxone (NARCAN) nasal spray 4 mg/0.1 mL For excess sedation from opioids   nitroGLYCERIN (NITROSTAT) 0.3 MG SL tablet Place 1 tablet (0.3 mg total) under the tongue every 5 (five) minutes as needed for chest pain. Maximum 3 doses.   Oxycodone HCl 10 MG TABS Take 1 tablet (10 mg total) by mouth 4 (four) times daily.   pyridostigmine (MESTINON) 60 MG tablet Take 1 tablet by mouth 3 (three) times daily.   rosuvastatin (CRESTOR) 5 MG tablet Take 5 mg by mouth daily.   tamsulosin (FLOMAX) 0.4 MG CAPS capsule TAKE 1 CAPSULE(0.4 MG) BY MOUTH DAILY   [DISCONTINUED] metoprolol succinate (TOPROL-XL) 100 MG 24 hr tablet TAKE 1 TABLET BY MOUTH DAILY, TAKE WITH OR IMMEDIATELY FOLLOWING A MEAL(REPLACES CARVEDILOL)   No facility-administered encounter medications on file as of 07/18/2019.     Activities of Daily Living In your present state of health, do you have any  difficulty performing the following activities: 07/18/2019 06/14/2019  Hearing? Y Y  Comment bilateral hearing aids Deaf in left ear-getting a hearing aid  Vision? N Y  Difficulty concentrating or making decisions? N N  Walking or climbing stairs? Y Y  Comment - Difficulty with walking  Dressing or bathing? N N  Doing errands, shopping? N N  Preparing Food and eating ? N -  Using the Toilet? N -  In the past six months, have you accidently leaked urine? N -  Do you have problems with loss of bowel control? N -  Managing your Medications? N -  Managing your Finances? N -  Housekeeping or managing your Housekeeping? N -  Some recent data might be hidden    Patient Care Team: [name], MD as PCP - General (Family Medicine) [name], MD as PCP - Cardiology (Cardiology) [name], MD as Consulting Physician (Cardiology) Erby Pian, MD as Consulting Physician (Specialist) Molli Barrows, MD as Consulting Physician (Anesthesiology) Lonia Farber, MD as Consulting Physician (Internal Medicine) Anabel Bene, MD as Consulting Physician (Neurology)   Assessment:   This is a routine wellness examination for [name].  Exercise Activities and Dietary recommendations Current Exercise Habits: Home exercise routine, Type of exercise: walking;stretching;calisthenics, Time (Minutes): 30, Frequency (Times/Week): 7, Weekly Exercise (Minutes/Week): 210, Intensity: Moderate,  Exercise limited by: cardiac condition(s);respiratory conditions(s);orthopedic condition(s);neurologic condition(s)  Goals     DIET - INCREASE WATER INTAKE     Recommend to drink at least 6-8 8oz glasses of water per day.       Fall Risk Fall Risk  07/18/2019 06/14/2019 05/24/2019 05/05/2019 12/12/2018  Falls in the past year? 0 0 0 0 0  Comment - - - - -  Number falls in past yr: 0 0 - 0 0  Comment - - - - -  Injury with Fall? 0 0 - 0 0  Risk Factor Category  - - - - -  Risk for fall  due to : - - - - -  Risk for fall due to: Comment - - - - -  Follow up Falls prevention discussed - - - -   FALL RISK PREVENTION PERTAINING TO THE HOME:  Any stairs in or around the home? Yes  If so, do they handrails? Yes   Home free of loose throw rugs in walkways, pet beds, electrical cords, etc? Yes  Adequate lighting in your home to reduce risk of falls? Yes   ASSISTIVE DEVICES UTILIZED TO PREVENT FALLS:  Life alert? No  Use of a cane, walker or w/c? Yes  Grab bars in the bathroom? Yes  Shower chair or bench in shower? Yes  Elevated toilet seat or a handicapped toilet? Yes   DME ORDERS:  DME order needed?  No   TIMED UP AND GO:  Was the test performed? Yes .  Length of time to ambulate 10 feet: 6 sec.   GAIT:  Appearance of gait: Gait stead-fast and with the use of an assistive device.   Education: Fall risk prevention has been discussed.  Intervention(s) required? No    Depression Screen PHQ 2/9 Scores 07/18/2019 06/14/2019 05/05/2019 12/12/2018  PHQ - 2 Score 6 2 0 0  PHQ- 9 Score 19 11 0 3  Exception Documentation - - - -    Cognitive Function Pt declined 6CIT for 2020 AWV     6CIT Screen 02/25/2018  What Year? 0 points  What month? 0 points  What time? 0 points  Count back from 20 0 points  Months in reverse 0 points  Repeat phrase 0 points  Total Score 0    Immunization History  Administered Date(s) Administered   Influenza Inj Mdck Quad Pf 06/14/2019   Influenza,inj,Quad PF,6+ Mos 06/16/2017, 06/10/2018   Pneumococcal Polysaccharide-23 01/25/2018   Td 04/04/2013    Qualifies for Shingles Vaccine?  Yes . Due for Shingrix. Education has been provided regarding the importance of this vaccine. Pt has been advised to call insurance company to determine out of pocket expense. Advised may also receive vaccine at local pharmacy or Health Dept. Verbalized acceptance and understanding.  Tdap: Up to date  Flu Vaccine: Up to date  Pneumococcal  Vaccine: Due for Pneumococcal vaccine. Does the patient want to receive this vaccine today?  No . Education has been provided regarding the importance of this vaccine but still declined. Advised may receive this vaccine at local pharmacy or Health Dept. Aware to provide a copy of the vaccination record if obtained from local pharmacy or Health Dept. Verbalized acceptance and understanding.   Screening Tests Health Maintenance  Topic Date Due   OPHTHALMOLOGY EXAM  05/17/2019   HEMOGLOBIN A1C  11/05/2019   FOOT EXAM  06/13/2020   COLONOSCOPY  01/09/2021   TETANUS/TDAP  04/05/2023   INFLUENZA VACCINE  Completed  PNEUMOCOCCAL POLYSACCHARIDE VACCINE AGE 60-64 HIGH RISK  Completed   Hepatitis C Screening  Completed   HIV Screening  Completed   Cancer Screenings:  Colorectal Screening: Completed 01/10/16. Repeat every 5 years; Pt plans to discuss with Dr. Ancil Boozer due to father's recent diagnosis of anal cancer and grandmother had colorectal cancer.   Lung Cancer Screening: (Low Dose CT Chest recommended if Age 65-80 years, 30 pack-year currently smoking OR have quit w/in 15years.) does not qualify.   Additional Screening:  Hepatitis C Screening: does qualify; Completed 01/26/18  Vision Screening: Recommended annual ophthalmology exams for early detection of glaucoma and other disorders of the eye. Is the patient up to date with their annual eye exam?  NO - postponed due to Covid Who is the provider or what is the name of the office in which the pt attends annual eye exams? [place] Screening: Recommended annual dental exams for proper oral hygiene  Community Resource Referral:  CRR required this visit?  YES      Plan:    I have personally reviewed and addressed the Medicare Annual Wellness questionnaire and have noted the following in the patients chart:  A. Medical and social history B. Use of alcohol, tobacco or illicit drugs  C. Current medications and  supplements D. Functional ability and status E.  Nutritional status F.  Physical activity G. Advance directives H. List of other physicians I.  Hospitalizations, surgeries, and ER visits in previous 12 months J.  Reserve such as hearing and vision if needed, cognitive and depression L. Referrals and appointments   In addition, I have reviewed and discussed with patient certain preventive protocols, quality metrics, and best practice recommendations. A written personalized care plan for preventive services as well as general preventive health recommendations were provided to patient.   Signed,  Clemetine Marker, LPN Nurse Health Advisor   Nurse Notes: pt has follow up appt with Dr. Ancil Boozer later this week. He was recently seen at [facility] and TSH level abnormal, started levothyroxine 25 mcg one week ago and pt wants to know if this is something Dr. Ancil Boozer will be able to manage or if he needs to be managed by endocrinology which is scheduled to see in December.  He also stated approx 10 years ago in [place] he may be been diagnosed with Hashimoto's but unsure if accurate at that time. He c/o 20 lb weight gain over the past year and lack of energy which is frustrating because he feels like he has been working hard to watch his weight due to CHF and diet for healthy eating and increase exercise. He is also concerned about possible anemia and would like to discuss anxiety/depression related to his dad being in [place] and recent diagnosis of anal cancer along with dementia and not being able to go there to assist in caring for him. PHQ9 score of 19 today. He may be interested in possible medication but doesn't want to be on anything long term. He tried to meditate daily and referral to [facility] team for counseling and support resources. He feels this is just a "rough patch" but want to cope in a healthy way.

## 2019-07-18 NOTE — Patient Instructions (Signed)
[name] , Thank you for taking time to come for your Medicare Wellness Visit. I appreciate your ongoing commitment to your health goals. Please review the following plan we discussed and let me know if I can assist you in the future.   Screening recommendations/referrals: Colonoscopy: done 01/10/16. Repeat in 2022. Recommended yearly ophthalmology/optometry visit for glaucoma screening and checkup Recommended yearly dental visit for hygiene and checkup  Vaccinations: Influenza vaccine: done 06/14/19 Pneumococcal vaccine: done 01/25/18 Tdap vaccine: done 04/04/13 Shingles vaccine: Shingrix discussed. Please contact your pharmacy for coverage information.   Advanced directives: Advance directive discussed with you today. I have provided a copy for you to complete at home and have notarized. Once this is complete please bring a copy in to our office so we can scan it into your chart.  Conditions/risks identified: Recommend drinking 6-8 glasses of water per day  Next appointment: Please follow up in one year for your Medicare Annual Wellness visit.    Preventive Care 40-64 Years, Male Preventive care refers to lifestyle choices and visits with your health care provider that can promote health and wellness. What does preventive care include?  A yearly physical exam. This is also called an annual well check.  Dental exams once or twice a year.  Routine eye exams. Ask your health care provider how often you should have your eyes checked.  Personal lifestyle choices, including:  Daily care of your teeth and gums.  Regular physical activity.  Eating a healthy diet.  Avoiding tobacco and drug use.  Limiting alcohol use.  Practicing safe sex.  Taking low-dose aspirin every day starting at age 70. What happens during an annual well check? The services and screenings done by your health care provider during your annual well check will depend on your age, overall health, lifestyle risk  factors, and family history of disease. Counseling  Your health care provider may ask you questions about your:  Alcohol use.  Tobacco use.  Drug use.  Emotional well-being.  Home and relationship well-being.  Sexual activity.  Eating habits.  Work and work Statistician. Screening  You may have the following tests or measurements:  Height, weight, and BMI.  Blood pressure.  Lipid and cholesterol levels. These may be checked every 5 years, or more frequently if you are over 48 years old.  Skin check.  Lung cancer screening. You may have this screening every year starting at age 54 if you have a 30-pack-year history of smoking and currently smoke or have quit within the past 15 years.  Fecal occult blood test (FOBT) of the stool. You may have this test every year starting at age 62.  Flexible sigmoidoscopy or colonoscopy. You may have a sigmoidoscopy every 5 years or a colonoscopy every 10 years starting at age 27.  Prostate cancer screening. Recommendations will vary depending on your family history and other risks.  Hepatitis C blood test.  Hepatitis B blood test.  Sexually transmitted disease (STD) testing.  Diabetes screening. This is done by checking your blood sugar (glucose) after you have not eaten for a while (fasting). You may have this done every 1-3 years. Discuss your test results, treatment options, and if necessary, the need for more tests with your health care provider. Vaccines  Your health care provider may recommend certain vaccines, such as:  Influenza vaccine. This is recommended every year.  Tetanus, diphtheria, and acellular pertussis (Tdap, Td) vaccine. You may need a Td booster every 10 years.  Zoster vaccine. You  may need this after age 46.  Pneumococcal 13-valent conjugate (PCV13) vaccine. You may need this if you have certain conditions and have not been vaccinated.  Pneumococcal polysaccharide (PPSV23) vaccine. You may need one or two  doses if you smoke cigarettes or if you have certain conditions. Talk to your health care provider about which screenings and vaccines you need and how often you need them. This information is not intended to replace advice given to you by your health care provider. Make sure you discuss any questions you have with your health care provider. Document Released: 10/18/2015 Document Revised: 06/10/2016 Document Reviewed: 07/23/2015 Elsevier Interactive Patient Education  2017 [place] Prevention in the Home Falls can cause injuries. They can happen to people of all ages. There are many things you can do to make your home safe and to help prevent falls. What can I do on the outside of my home?  Regularly fix the edges of walkways and driveways and fix any cracks.  Remove anything that might make you trip as you walk through a door, such as a raised step or threshold.  Trim any bushes or trees on the path to your home.  Use bright outdoor lighting.  Clear any walking paths of anything that might make someone trip, such as rocks or tools.  Regularly check to see if handrails are loose or broken. Make sure that both sides of any steps have handrails.  Any raised decks and porches should have guardrails on the edges.  Have any leaves, snow, or ice cleared regularly.  Use sand or salt on walking paths during winter.  Clean up any spills in your garage right away. This includes oil or grease spills. What can I do in the bathroom?  Use night lights.  Install grab bars by the toilet and in the tub and shower. Do not use towel bars as grab bars.  Use non-skid mats or decals in the tub or shower.  If you need to sit down in the shower, use a plastic, non-slip stool.  Keep the floor dry. Clean up any water that spills on the floor as soon as it happens.  Remove soap buildup in the tub or shower regularly.  Attach bath mats securely with double-sided non-slip rug tape.  Do  not have throw rugs and other things on the floor that can make you trip. What can I do in the bedroom?  Use night lights.  Make sure that you have a light by your bed that is easy to reach.  Do not use any sheets or blankets that are too big for your bed. They should not hang down onto the floor.  Have a firm chair that has side arms. You can use this for support while you get dressed.  Do not have throw rugs and other things on the floor that can make you trip. What can I do in the kitchen?  Clean up any spills right away.  Avoid walking on wet floors.  Keep items that you use a lot in easy-to-reach places.  If you need to reach something above you, use a strong step stool that has a grab bar.  Keep electrical cords out of the way.  Do not use floor polish or wax that makes floors slippery. If you must use wax, use non-skid floor wax.  Do not have throw rugs and other things on the floor that can make you trip. What can I do with my stairs?  Do not leave any items on the stairs.  Make sure that there are handrails on both sides of the stairs and use them. Fix handrails that are broken or loose. Make sure that handrails are as long as the stairways.  Check any carpeting to make sure that it is firmly attached to the stairs. Fix any carpet that is loose or worn.  Avoid having throw rugs at the top or bottom of the stairs. If you do have throw rugs, attach them to the floor with carpet tape.  Make sure that you have a light switch at the top of the stairs and the bottom of the stairs. If you do not have them, ask someone to add them for you. What else can I do to help prevent falls?  Wear shoes that:  Do not have high heels.  Have rubber bottoms.  Are comfortable and fit you well.  Are closed at the toe. Do not wear sandals.  If you use a stepladder:  Make sure that it is fully opened. Do not climb a closed stepladder.  Make sure that both sides of the stepladder  are locked into place.  Ask someone to hold it for you, if possible.  Clearly mark and make sure that you can see:  Any grab bars or handrails.  First and last steps.  Where the edge of each step is.  Use tools that help you move around (mobility aids) if they are needed. These include:  Canes.  Walkers.  Scooters.  Crutches.  Turn on the lights when you go into a dark area. Replace any light bulbs as soon as they burn out.  Set up your furniture so you have a clear path. Avoid moving your furniture around.  If any of your floors are uneven, fix them.  If there are any pets around you, be aware of where they are.  Review your medicines with your doctor. Some medicines can make you feel dizzy. This can increase your chance of falling. Ask your doctor what other things that you can do to help prevent falls. This information is not intended to replace advice given to you by your health care provider. Make sure you discuss any questions you have with your health care provider. Document Released: 07/18/2009 Document Revised: 02/27/2016 Document Reviewed: 10/26/2014 Elsevier Interactive Patient Education  2017 [employer].

## 2019-07-19 ENCOUNTER — Ambulatory Visit: Payer: Medicare Other | Attending: Anesthesiology | Admitting: Anesthesiology

## 2019-07-19 ENCOUNTER — Encounter: Payer: Self-pay | Admitting: Anesthesiology

## 2019-07-19 DIAGNOSIS — M542 Cervicalgia: Secondary | ICD-10-CM | POA: Diagnosis not present

## 2019-07-19 DIAGNOSIS — G7000 Myasthenia gravis without (acute) exacerbation: Secondary | ICD-10-CM

## 2019-07-19 DIAGNOSIS — G894 Chronic pain syndrome: Secondary | ICD-10-CM | POA: Diagnosis not present

## 2019-07-19 DIAGNOSIS — M4716 Other spondylosis with myelopathy, lumbar region: Secondary | ICD-10-CM

## 2019-07-19 DIAGNOSIS — M5432 Sciatica, left side: Secondary | ICD-10-CM | POA: Diagnosis not present

## 2019-07-19 DIAGNOSIS — F1190 Opioid use, unspecified, uncomplicated: Secondary | ICD-10-CM

## 2019-07-19 DIAGNOSIS — M47817 Spondylosis without myelopathy or radiculopathy, lumbosacral region: Secondary | ICD-10-CM

## 2019-07-19 DIAGNOSIS — M5136 Other intervertebral disc degeneration, lumbar region: Secondary | ICD-10-CM | POA: Diagnosis not present

## 2019-07-19 MED ORDER — OXYCODONE HCL 10 MG PO TABS: 10.0000 mg | ORAL_TABLET | Freq: Four times a day (QID) | ORAL | 0 refills | Status: DC

## 2019-07-19 MED ORDER — OXYCODONE HCL 10 MG PO TABS: 10.0000 mg | ORAL_TABLET | Freq: Four times a day (QID) | ORAL | 0 refills | Status: AC

## 2019-07-19 NOTE — Progress Notes (Signed)
Virtual Visit via Video Note  I connected with [name] on 07/19/19 at  1:15 PM EDT by a video enabled telemedicine application and verified that I am speaking with the correct person using two identifiers.  Location: Patient: Home Provider: Pain control center   I discussed the limitations of evaluation and management by telemedicine and the availability of in person appointments. The patient expressed understanding and agreed to proceed.  History of Present Illness: I spoke with [name] today via video voice conferencing and he is doing better following his last epidural.  He is approximately 60% improved following the injection about a month ago.  The quality characteristic and distribution of the pain are stable in nature with no significant changes noted today in regards to his strength or lower extremity function but he is having some recurrence of the same quality pain.  No change in bowel or bladder function is noted and he is taking his medications as prescribed and these continue to work well for him.  He is due for these.  He denies side effects and gets good relief with the medicines and denies any diverting or illicit use.  His lifestyle function is improved with the medications he reports    Observations/Objective:[name] Current Outpatient Medications:  .  albuterol (PROVENTIL HFA;VENTOLIN HFA) 108 (90 Base) MCG/ACT inhaler, Inhale 2 puffs into the lungs every 6 (six) hours as needed for wheezing or shortness of breath., Disp: , Rfl:  .  albuterol (PROVENTIL) (2.5 MG/3ML) 0.083% nebulizer solution, Take 2.5 mg by nebulization every 6 (six) hours as needed for wheezing or shortness of breath., Disp: , Rfl:  .  aspirin EC 81 MG tablet, Take 1 tablet (81 mg total) by mouth daily., Disp: 90 tablet, Rfl: 3 .  betamethasone valerate ointment (VALISONE) 0.1 %, Apply topically., Disp: , Rfl:  .  cholecalciferol (VITAMIN D3) 25 MCG (1000 UT) tablet, Take 2,000 Units by mouth daily.,  Disp: , Rfl:  .  clopidogrel (PLAVIX) 75 MG tablet, Take 1 tablet (75 mg total) by mouth daily with breakfast., Disp: 30 tablet, Rfl: 6 .  cyanocobalamin 1000 MCG tablet, Take 1,000 mcg by mouth daily. , Disp: , Rfl:  .  cyclobenzaprine (FLEXERIL) 10 MG tablet, Take 1 tablet by mouth 2 (two) times daily., Disp: , Rfl:  .  DEXILANT 60 MG capsule, TAKE ONE CAPSULE BY MOUTH DAILY, Disp: 30 capsule, Rfl: 11 .  ezetimibe (ZETIA) 10 MG tablet, Take 1 tablet (10 mg total) by mouth daily., Disp: 90 tablet, Rfl: 3 .  fluticasone (FLOVENT HFA) 220 MCG/ACT inhaler, Inhale into the lungs., Disp: , Rfl:  .  fluticasone furoate-vilanterol (BREO ELLIPTA) 200-25 MCG/INH AEPB, Inhale 1 puff into the lungs daily., Disp: 3 each, Rfl: 1 .  furosemide (LASIX) 20 MG tablet, Take 20 mg by mouth every other day., Disp: , Rfl:  .  gabapentin (NEURONTIN) 300 MG capsule, Take 1 capsule (300 mg total) by mouth 3 (three) times daily., Disp: 90 capsule, Rfl: 1 .  gabapentin (NEURONTIN) 800 MG tablet, Take 800 mg by mouth at bedtime., Disp: , Rfl:  .  insulin lispro (HUMALOG KWIKPEN) 100 UNIT/ML KiwkPen, Inject 7-8 Units into the skin 3 (three) times daily. Reported on 12/02/2015/ sliding scale 1 unit for every 8 units of carbs; and 1 unit for every 20 above 120, Disp: , Rfl:  .  Insulin Pen Needle (FIFTY50 PEN NEEDLES) 32G X 4 MM MISC, Inject 1 each into the skin as needed., Disp: ,  Rfl:  .  LANTUS SOLOSTAR 100 UNIT/ML Solostar Pen, Inject 25-30 Units into the skin daily at 10 pm. , Disp: , Rfl: 0 .  levocetirizine (XYZAL) 5 MG tablet, TAKE 1 TABLET(5 MG) BY MOUTH EVERY EVENING, Disp: 30 tablet, Rfl: 3 .  levothyroxine (SYNTHROID) 25 MCG tablet, Take 1 tablet (25 mcg total) by mouth daily before breakfast., Disp: 30 tablet, Rfl: 1 .  liraglutide (VICTOZA) 18 MG/3ML SOPN, 1.8 units every morning (Patient taking differently: Inject 1.8 mg into the skin daily. ), Disp: , Rfl:  .  loratadine (CLARITIN) 10 MG tablet, Take 10 mg by  mouth daily. , Disp: , Rfl:  .  losartan (COZAAR) 25 MG tablet, Take 1 tablet (25 mg total) by mouth daily., Disp: 30 tablet, Rfl: 5 .  metFORMIN (GLUCOPHAGE) 1000 MG tablet, Take 1,000 mg by mouth 2 (two) times daily with a meal., Disp: , Rfl:  .  metoprolol tartrate (LOPRESSOR) 50 MG tablet, Take 1 tablet by mouth 2 (two) times daily., Disp: , Rfl:  .  montelukast (SINGULAIR) 10 MG tablet, Take 10 mg by mouth at bedtime., Disp: , Rfl:  .  Multiple Vitamins-Minerals (MENS MULTI VITAMIN & MINERAL PO), Take 1 tablet by mouth daily., Disp: , Rfl:  .  naloxone (NARCAN) nasal spray 4 mg/0.1 mL, For excess sedation from opioids, Disp: 1 kit, Rfl: 2 .  nitroGLYCERIN (NITROSTAT) 0.3 MG SL tablet, Place 1 tablet (0.3 mg total) under the tongue every 5 (five) minutes as needed for chest pain. Maximum 3 doses., Disp: 25 tablet, Rfl: 3 .  Oxycodone HCl 10 MG TABS, Take 1 tablet (10 mg total) by mouth 4 (four) times daily., Disp: 120 tablet, Rfl: 0 .  [START ON 08/18/2019] Oxycodone HCl 10 MG TABS, Take 1 tablet (10 mg total) by mouth 4 (four) times daily., Disp: 120 tablet, Rfl: 0 .  pyridostigmine (MESTINON) 60 MG tablet, Take 1 tablet by mouth 3 (three) times daily., Disp: , Rfl:  .  rosuvastatin (CRESTOR) 5 MG tablet, Take 5 mg by mouth daily., Disp: , Rfl: 0 .  tamsulosin (FLOMAX) 0.4 MG CAPS capsule, TAKE 1 CAPSULE(0.4 MG) BY MOUTH DAILY, Disp: 30 capsule, Rfl: 0   Assessment and Plan:dermatitis 1. DDD (degenerative disc disease), lumbar   2. Lumbar spondylosis with myelopathy   3. Sciatica, left side   4. Chronic pain syndrome   5. Cervicalgia   6. Myasthenia gravis ([place])   7. Chronic, continuous use of opioids   8. Facet arthritis of lumbosacral region   Based on our discussion today and upon review of the [facility] practitioner database information going to refill his medications for the next 2 months.  This will be dated for October 14 and November 13.  We will have him continue take his  medications and return to clinic in 1 month for a scheduled epidural to see if we can help with better improvement with the left side sciatica.  I want him to continue with stretching strengthening exercises as reviewed with him once again today.  Furthermore he is to continue follow-up with his primary care physicians for his baseline medical care.   Follow Up Instructions:    I discussed the assessment and treatment plan with the patient. The patient was provided an opportunity to ask questions and all were answered. The patient agreed with the plan and demonstrated an understanding of the instructions.   The patient was advised to call back or seek an in-person evaluation if the  symptoms worsen or if the condition fails to improve as anticipated.  I provided 30 minutes of non-face-to-face time during this encounter.   Molli Barrows, MD

## 2019-07-21 ENCOUNTER — Ambulatory Visit: Payer: Medicare Other | Admitting: Family Medicine

## 2019-07-21 ENCOUNTER — Other Ambulatory Visit: Payer: Self-pay

## 2019-07-21 ENCOUNTER — Encounter: Payer: Self-pay | Admitting: Family Medicine

## 2019-07-21 VITALS — BP 120/70 | HR 65 | Temp 96.9°F | Resp 16 | Ht 67.0 in | Wt 246.6 lb

## 2019-07-21 DIAGNOSIS — Z8489 Family history of other specified conditions: Secondary | ICD-10-CM | POA: Diagnosis not present

## 2019-07-21 DIAGNOSIS — G7000 Myasthenia gravis without (acute) exacerbation: Secondary | ICD-10-CM

## 2019-07-21 DIAGNOSIS — E538 Deficiency of other specified B group vitamins: Secondary | ICD-10-CM

## 2019-07-21 DIAGNOSIS — Z23 Encounter for immunization: Secondary | ICD-10-CM | POA: Diagnosis not present

## 2019-07-21 DIAGNOSIS — E063 Autoimmune thyroiditis: Secondary | ICD-10-CM | POA: Diagnosis not present

## 2019-07-21 DIAGNOSIS — F331 Major depressive disorder, recurrent, moderate: Secondary | ICD-10-CM

## 2019-07-21 DIAGNOSIS — F411 Generalized anxiety disorder: Secondary | ICD-10-CM

## 2019-07-21 MED ORDER — DULOXETINE HCL 30 MG PO CPEP: 30.0000 mg | ORAL_CAPSULE | Freq: Every day | ORAL | 0 refills | Status: DC

## 2019-07-21 MED ORDER — CYANOCOBALAMIN 1000 MCG/ML IJ SOLN
1000.0000 ug | Freq: Once | INTRAMUSCULAR | Status: AC
  Administered 2019-07-21: 1000 ug via INTRAMUSCULAR

## 2019-07-21 NOTE — Progress Notes (Signed)
Name: [name]   MRN: [id number]    DOB: 1961/09/06   Date:07/21/2019       Progress Note  Subjective  Chief Complaint  Chief Complaint  Patient presents with  . Diabetes  . Thyroid Problem    He reports that his tsh is out of normal range.  . Depression    Dad has been dx with anal cancer and he has increased depression because of it.    HPI  B12 deficiency: he has been on supplementation and last level done 07/10/2019 at [facility] was 268 and methylmalonic acid also low at 0.59. 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 He has gained weight this year, he states fatigue is high again, having to nap again. No change in bowel movements  Myasthenia Gravis: currently seeing Dr. Westley Foots at [facility], recent visit was productive, got some labs, going back for NCS, she also advised to change CPAP mack since has a beard and he has contacted Dr. Raul Del about it     Family history of anal cancer: father recently diagnosed with anal cancer recently, last colonoscopy done in 2017. We will ask Dr. Durwin Reges if he needs to be seen sooner .   MDD/GAD: he is very upset about finding out that his father has anal cancer and is 62 yo, his father lives in [place] and because of [place] he is unable to go visit him, he is very upset about it. He has long of depression, never took medication in the past, but he is worried this time because he is also feeling anxious and Phq 9 has gone up from 11-14 today int he past 3 months. Feeling tired and down most of the time, no motivation.     Patient Active Problem List   Diagnosis Date Noted  . Carpal tunnel syndrome on both sides 07/12/2019  . Weakness generalized 06/21/2019  . Idiopathic peripheral neuropathy 04/21/2019  . Sensory ataxia 03/09/2019  . Colitis 07/08/2018  . OSA  on CPAP 07/08/2018  . Congestive heart failure (CHF) ([place]) 05/05/2018  . Unstable angina ([place]) 04/26/2018  . Ischemic cardiomyopathy 04/26/2018  . Chronic combined systolic and diastolic CHF (congestive heart failure) ([place]) 04/26/2018  . Effort angina ([place]) 04/25/2018  . 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[place]) 01/25/2018  . Hyperlipidemia due to type 2 diabetes mellitus ([place]) 08/12/2017  . Vitamin D deficiency, unspecified 08/12/2017  . Ptosis of left eyelid 01/08/2017  . Dermatitis 12/24/2016  . Difficulty walking 04/27/2016  . Foot cramps 04/27/2016  . Morbid (severe) obesity due to excess calories ([place]) 04/06/2016  . Benign neoplasm of sigmoid colon   . Benign neoplasm of descending colon   . Benign neoplasm of transverse colon   . Coronary artery disease involving native coronary artery with angina pectoris ([place]) 10/22/2015  . Coronary artery disease 08/22/2015  . DM (diabetes mellitus), type 2, uncontrolled with complications ([place]) 42/68/3419  . Myasthenia gravis ([place]) 08/22/2015  . Chronic left-sided low back pain 08/22/2015  . Hypertension 08/22/2015  . GERD (gastroesophageal reflux disease) 08/22/2015  . Arthritis 08/22/2015  . Diabetic peripheral neuropathy ([place]) 08/22/2015  . Hyperlipidemia 08/22/2015  . Insomnia 08/22/2015  . Type 2 diabetes mellitus with diabetic polyneuropathy, with long-term current use of insulin ([place]) 08/22/2015  . Asthma 08/22/2015  . Lyme disease 06/05/1992  Past Surgical History:  Procedure Laterality Date  . BILATERAL CARPAL TUNNEL RELEASE Bilateral L in 2012 and R in 2013  . CARDIAC CATHETERIZATION     Several Caths, most recent in  March 2016.  [name] COLONOSCOPY WITH PROPOFOL N/A 01/10/2016   Procedure: COLONOSCOPY WITH PROPOFOL;  Surgeon: [name], MD;  Location: [facility] ENDOSCOPY;  Service: Endoscopy;  Laterality: N/A;  . CORONARY ANGIOPLASTY    . CORONARY STENT INTERVENTION N/A 04/25/2018   Procedure: CORONARY STENT  INTERVENTION;  Surgeon: [name], MD;  Location: [facility] [facility];  Service: Cardiovascular;  Laterality: N/A;  . ESOPHAGOGASTRODUODENOSCOPY (EGD) WITH PROPOFOL N/A 01/10/2016   Procedure: ESOPHAGOGASTRODUODENOSCOPY (EGD) WITH PROPOFOL;  Surgeon: [name], MD;  Location: [facility] ENDOSCOPY;  Service: Endoscopy;  Laterality: N/A;  . EYE SURGERY Bilateral 2012   cataract/bilateral vitrectomies  . LEFT HEART CATH AND CORONARY ANGIOGRAPHY Left 04/25/2018   Procedure: LEFT HEART CATH AND CORONARY ANGIOGRAPHY;  Surgeon: [name], MD;  Location: [place] [facility];  Service: Cardiovascular;  Laterality: Left;  . TEE WITHOUT CARDIOVERSION N/A 09/05/2018   Procedure: TRANSESOPHAGEAL ECHOCARDIOGRAM (TEE);  Surgeon: [name], MD;  Location: [facility] ORS;  Service: Cardiovascular;  Laterality: N/A;  . TONSILLECTOMY AND ADENOIDECTOMY     As a child  . TUNNELED VENOUS CATHETER PLACEMENT     removed    Family History  Problem Relation Age of Onset  . Diabetes Mother   . Heart disease Mother   . Cancer Father        Prostate CA, Anal cancer   . Dementia Father   . Diabetes Brother   . Healthy Brother   . Healthy Brother     Social History   Socioeconomic History  . Marital status: Married    Spouse name: Neoma Laming  . Number of children: 0  . Years of education: Not on file  . Highest education level: Master's degree (e.g., MA, MS, MEng, MEd, MSW, MBA)  Occupational History  . Occupation: disabled    Comment: multiple medical problems - first approved for uncontrolled DM, but now has heart disease and Myasthenia Gravis   Social Needs  . Financial resource strain: Not hard at all  . Food insecurity    Worry: Never true    Inability: Never true  . Transportation needs    Medical: No    Non-medical: No  Tobacco Use  . Smoking status: Never Smoker  . Smokeless tobacco: Never Used  . Tobacco comment: smoking cessation materials not required  Substance and Sexual  Activity  . Alcohol use: Not Currently    Alcohol/week: 0.0 standard drinks  . Drug use: No  . Sexual activity: Not Currently    Partners: Female  Lifestyle  . Physical activity    Days per week: 7 days    Minutes per session: 30 min  . Stress: Only a little  Relationships  . Social connections    Talks on phone: More than three times a week    Gets together: Three times a week    Attends religious service: More than 4 times per year    Active member of club or organization: Yes    Attends meetings of clubs or organizations: More than 4 times per year    Relationship status: Married  . Intimate partner violence    Fear of current or ex partner: No    Emotionally abused: No    Physically abused: No    Forced sexual activity: No  Other Topics Concern  . Not on file  Social History Narrative  . Not on file     Current Outpatient Medications:  .  albuterol (PROVENTIL HFA;VENTOLIN HFA) 108 (90 Base) MCG/ACT inhaler, Inhale 2 puffs into the lungs every 6 (six) hours as needed for wheezing or shortness of breath., Disp: , Rfl:  .  albuterol (PROVENTIL) (2.5 MG/3ML) 0.083% nebulizer solution, Take 2.5 mg by nebulization every 6 (six) hours as needed for wheezing or shortness of breath., Disp: , Rfl:  .  aspirin EC 81 MG tablet, Take 1 tablet (81 mg total) by mouth daily., Disp: 90 tablet, Rfl: 3 .  betamethasone valerate ointment (VALISONE) 0.1 %, Apply topically., Disp: , Rfl:  .  cholecalciferol (VITAMIN D3) 25 MCG (1000 UT) tablet, Take 2,000 Units by mouth daily., Disp: , Rfl:  .  clopidogrel (PLAVIX) 75 MG tablet, Take 1 tablet (75 mg total) by mouth daily with breakfast., Disp: 30 tablet, Rfl: 6 .  cyanocobalamin 1000 MCG tablet, Take 1,000 mcg by mouth daily. , Disp: , Rfl:  .  cyclobenzaprine (FLEXERIL) 10 MG tablet, Take 1 tablet by mouth 2 (two) times daily., Disp: , Rfl:  .  DEXILANT 60 MG capsule, TAKE ONE CAPSULE BY MOUTH DAILY, Disp: 30 capsule, Rfl: 11 .  ezetimibe  (ZETIA) 10 MG tablet, Take 1 tablet (10 mg total) by mouth daily., Disp: 90 tablet, Rfl: 3 .  fluticasone (FLOVENT HFA) 220 MCG/ACT inhaler, Inhale into the lungs., Disp: , Rfl:  .  fluticasone furoate-vilanterol (BREO ELLIPTA) 200-25 MCG/INH AEPB, Inhale 1 puff into the lungs daily., Disp: 3 each, Rfl: 1 .  furosemide (LASIX) 20 MG tablet, Take 20 mg by mouth every other day., Disp: , Rfl:  .  gabapentin (NEURONTIN) 300 MG capsule, Take 1 capsule (300 mg total) by mouth 3 (three) times daily., Disp: 90 capsule, Rfl: 1 .  gabapentin (NEURONTIN) 800 MG tablet, Take 800 mg by mouth at bedtime., Disp: , Rfl:  .  insulin lispro (HUMALOG KWIKPEN) 100 UNIT/ML KiwkPen, Inject 7-8 Units into the skin 3 (three) times daily. Reported on 12/02/2015/ sliding scale 1 unit for every 8 units of carbs; and 1 unit for every 20 above 120, Disp: , Rfl:  .  Insulin Pen Needle (FIFTY50 PEN NEEDLES) 32G X 4 MM MISC, Inject 1 each into the skin as needed., Disp: , Rfl:  .  LANTUS SOLOSTAR 100 UNIT/ML Solostar Pen, Inject 25-30 Units into the skin daily at 10 pm. , Disp: , Rfl: 0 .  levocetirizine (XYZAL) 5 MG tablet, TAKE 1 TABLET(5 MG) BY MOUTH EVERY EVENING, Disp: 30 tablet, Rfl: 3 .  levothyroxine (SYNTHROID) 25 MCG tablet, Take 1 tablet (25 mcg total) by mouth daily before breakfast., Disp: 30 tablet, Rfl: 1 .  liraglutide (VICTOZA) 18 MG/3ML SOPN, 1.8 units every morning (Patient taking differently: Inject 1.8 mg into the skin daily. ), Disp: , Rfl:  .  loratadine (CLARITIN) 10 MG tablet, Take 10 mg by mouth daily. , Disp: , Rfl:  .  losartan (COZAAR) 25 MG tablet, Take 1 tablet (25 mg total) by mouth daily., Disp: 30 tablet, Rfl: 5 .  metFORMIN (GLUCOPHAGE) 1000 MG tablet, Take 1,000 mg by mouth 2 (two) times daily with a meal., Disp: , Rfl:  .  metoprolol tartrate (LOPRESSOR) 50 MG tablet, Take 1 tablet by mouth 2 (two) times daily., Disp: , Rfl:  .  montelukast (SINGULAIR) 10 MG tablet, Take 10 mg by mouth at  bedtime., Disp: ,  Rfl:  .  Multiple Vitamins-Minerals (MENS MULTI VITAMIN & MINERAL PO), Take 1 tablet by mouth daily., Disp: , Rfl:  .  naloxone (NARCAN) nasal spray 4 mg/0.1 mL, For excess sedation from opioids, Disp: 1 kit, Rfl: 2 .  nitroGLYCERIN (NITROSTAT) 0.3 MG SL tablet, Place 1 tablet (0.3 mg total) under the tongue every 5 (five) minutes as needed for chest pain. Maximum 3 doses., Disp: 25 tablet, Rfl: 3 .  Oxycodone HCl 10 MG TABS, Take 1 tablet (10 mg total) by mouth 4 (four) times daily., Disp: 120 tablet, Rfl: 0 .  [START ON 08/18/2019] Oxycodone HCl 10 MG TABS, Take 1 tablet (10 mg total) by mouth 4 (four) times daily., Disp: 120 tablet, Rfl: 0 .  pyridostigmine (MESTINON) 60 MG tablet, Take 1 tablet by mouth 3 (three) times daily., Disp: , Rfl:  .  rosuvastatin (CRESTOR) 5 MG tablet, Take 5 mg by mouth daily., Disp: , Rfl: 0 .  tamsulosin (FLOMAX) 0.4 MG CAPS capsule, TAKE 1 CAPSULE(0.4 MG) BY MOUTH DAILY, Disp: 30 capsule, Rfl: 0  Allergies  Allergen Reactions  . Azathioprine Other (See Comments)    Azathioprine hypersensitivity reaction  . Novolog [Insulin Aspart] Hives    I personally reviewed active problem list, medication list, allergies, family history, social history, health maintenance with the patient/caregiver today.   ROS  Constitutional: Negative for fever or weight change.  Respiratory: Negative for cough and shortness of breath.   Cardiovascular: Negative for chest pain or palpitations.  Gastrointestinal: Negative for abdominal pain, no bowel changes.  Musculoskeletal: Positive  for gait problem but no  joint swelling.  Skin: Negative for rash.  Neurological: Negative for dizziness or headache.  No other specific complaints in a complete review of systems (except as listed in HPI above).  Objective  Vitals:   07/21/19 0934  BP: 120/70  Pulse: 65  Resp: 16  Temp: (!) 96.9 F (36.1 C)  TempSrc: Temporal  SpO2: 96%  Weight: 246 lb 9.6 oz (111.9  kg)  Height: '5\' 7"'$  (1.702 m)    Body mass index is 38.62 kg/m.  Physical Exam  Constitutional: Patient appears well-developed and well-nourished. Obese  No distress.  HEENT: head atraumatic, normocephalic, pupils equal and reactive to light, ptosis very mild on left eye  Cardiovascular: Normal rate, regular rhythm and normal heart sounds.  No murmur heard. No BLE edema. Pulmonary/Chest: Eff[place] normal, he has end inspiratory wheezing -he states it flares this time of the year No respiratory distress. Abdominal: Soft.  There is no tenderness. Psychiatric: Patient has a normal mood and affect. behavior is normal. Judgment and thought content normal.  Recent Results (from the past 2160 hour(s))  POCT urinalysis dipstick     Status: Normal   Collection Time: 05/05/19  9:30 AM  Result Value Ref Range   Color, UA amber    Clarity, UA clear    Glucose, UA Negative Negative   Bilirubin, UA neg    Ketones, UA neg    Spec Grav, UA 1.015 1.010 - 1.025   Blood, UA neg    pH, UA 5.0 5.0 - 8.0   Protein, UA Negative Negative   Urobilinogen, UA 0.2 0.2 or 1.0 E.U./dL   Nitrite, UA neg    Leukocytes, UA Negative Negative   Appearance     Odor    COMPLETE METABOLIC PANEL WITH GFR     Status: Abnormal   Collection Time: 05/05/19 10:12 AM  Result Value Ref Range   Glucose,  Bld 177 (H) 65 - 99 mg/dL    Comment: .            Fasting reference interval . For someone without known diabetes, a glucose value >125 mg/dL indicates that they may have diabetes and this should be confirmed with a follow-up test. .    BUN 33 (H) 7 - 25 mg/dL   Creat 1.54 (H) 0.70 - 1.33 mg/dL    Comment: For patients >12 years of age, the reference limit for Creatinine is approximately 13% higher for people identified as African-American. .    GFR, Est Non African American 49 (L) > OR = 60 mL/min/1.21m   GFR, Est African American 56 (L) > OR = 60 mL/min/1.779m  BUN/Creatinine Ratio 21 6 - 22 (calc)    Sodium 136 135 - 146 mmol/L   Potassium 5.1 3.5 - 5.3 mmol/L   Chloride 103 98 - 110 mmol/L   CO2 24 20 - 32 mmol/L   Calcium 9.7 8.6 - 10.3 mg/dL   Total Protein 5.9 (L) 6.1 - 8.1 g/dL   Albumin 4.0 3.6 - 5.1 g/dL   Globulin 1.9 1.9 - 3.7 g/dL (calc)   AG Ratio 2.1 1.0 - 2.5 (calc)   Total Bilirubin 0.4 0.2 - 1.2 mg/dL   Alkaline phosphatase (APISO) 63 35 - 144 U/L   AST 16 10 - 35 U/L   ALT 14 9 - 46 U/L  Hemoglobin A1c     Status: Abnormal   Collection Time: 05/05/19 10:12 AM  Result Value Ref Range   Hgb A1c MFr Bld 7.9 (H) <5.7 % of total Hgb    Comment: For someone without known diabetes, a hemoglobin A1c value of 6.5% or greater indicates that they may have  diabetes and this should be confirmed with a follow-up  test. . For someone with known diabetes, a value <7% indicates  that their diabetes is well controlled and a value  greater than or equal to 7% indicates suboptimal  control. A1c targets should be individualized based on  duration of diabetes, age, comorbid conditions, and  other considerations. . Currently, no consensus exists regarding use of hemoglobin A1c for diagnosis of diabetes for children. .    Mean Plasma Glucose 180 (calc)   eAG (mmol/L) 10.0 (calc)  Microalbumin / creatinine urine ratio     Status: None   Collection Time: 05/05/19 10:12 AM  Result Value Ref Range   Creatinine, Urine 172 20 - 320 mg/dL   Microalb, Ur 1.4 mg/dL    Comment: Reference Range Not established    Microalb Creat Ratio 8 <30 mcg/mg creat    Comment: . The ADA defines abnormalities in albumin excretion as follows: . Marland Kitchenategory         Result (mcg/mg creatinine) . Normal                    <30 Microalbuminuria         30-299  Clinical albuminuria   > OR = 300 . The ADA recommends that at least two of three specimens collected within a 3-6 month period be abnormal before considering a patient to be within a diagnostic category.   PSA     Status: None    Collection Time: 05/05/19 10:12 AM  Result Value Ref Range   PSA 0.7 < OR = 4.0 ng/mL    Comment: The total PSA value from this assay system is  standardized against the WHO standard. The test  result will be approximately 20% lower when compared  to the equimolar-standardized total PSA (Beckman  Coulter). Comparison of serial PSA results should be  interpreted with this fact in mind. . This test was performed using the Siemens  chemiluminescent method. Values obtained from  different assay methods cannot be used interchangeably. PSA levels, regardless of value, should not be interpreted as absolute evidence of the presence or absence of disease.      PHQ2/9: Depression screen [facility] 2/9 07/21/2019 07/18/2019 06/14/2019 05/05/2019 12/12/2018  Decreased Interest '2 3 1 '$ 0 0  Down, Depressed, Hopeless '3 3 1 '$ 0 0  PHQ - 2 Score '5 6 2 '$ 0 0  Altered sleeping '2 3 2 '$ 0 1  Tired, decreased energy '1 3 2 '$ 0 1  Change in appetite '2 3 3 '$ 0 1  Feeling bad or failure about yourself  0 1 1 0 0  Trouble concentrating 3 2 0 0 0  Moving slowly or fidgety/restless '1 1 1 '$ 0 0  Suicidal thoughts 0 0 0 0 0  PHQ-9 Score '14 19 11 '$ 0 3  Difficult doing work/chores Somewhat difficult Somewhat difficult Somewhat difficult Not difficult at all Not difficult at all  Some recent data might be hidden    phq 9 is positive  GAD 7 : Generalized Anxiety Score 07/21/2019 08/12/2018 08/10/2018 07/26/2018  Nervous, Anxious, on Edge 2 0 0 0  Control/stop worrying 1 0 0 0  Worry too much - different things 2 0 0 0  Trouble relaxing 0 0 0 0  Restless 1 0 0 0  Easily annoyed or irritable '3 1 1 1  '$ Afraid - awful might happen 2 0 0 0  Total GAD 7 Score '11 1 1 1  '$ Anxiety Difficulty Somewhat difficult Not difficult at all Not difficult at all Not difficult at all    Fall Risk: Fall Risk  07/18/2019 06/14/2019 05/24/2019 05/05/2019 12/12/2018  Falls in the past year? 0 0 0 0 0  Comment - - - - -  Number falls in past yr: 0 0 - 0 0   Comment - - - - -  Injury with Fall? 0 0 - 0 0  Risk Factor Category  - - - - -  Risk for fall due to : - - - - -  Risk for fall due to: Comment - - - - -  Follow up Falls prevention discussed - - - -    Functional Status Survey: Is the patient deaf or have difficulty hearing?: Yes Does the patient have difficulty seeing, even when wearing glasses/contacts?: No Does the patient have difficulty concentrating, remembering, or making decisions?: No Does the patient have difficulty walking or climbing stairs?: No Does the patient have difficulty dressing or bathing?: No Does the patient have difficulty doing errands alone such as visiting a doctor's office or shopping?: Yes   Assessment & Plan  1. Hypothyroidism, acquired, autoimmune  - TSH  2. Myasthenia gravis ([place])  - Ambulatory referral to Physical Therapy  3. Family history of carcinoma in situ of anal canal  We will check with Dr. Durwin Reges if he should have colonoscopy done sooner  4. B12 deficiency  - cyanocobalamin ((VITAMIN B-12)) injection 1,000 mcg  5. Need for vaccination for pneumococcus  - Pneumococcal conjugate vaccine 13-valent IM  6. Moderate episode of recurrent major depressive disorder (HCC)  - DULoxetine (CYMBALTA) 30 MG capsule; Take 1-2 capsules (30-60 mg total) by mouth daily. One first week after that 2 daily  Dispense: 60 capsule; Refill: 0  7. GAD (generalized anxiety disorder)  - DULoxetine (CYMBALTA) 30 MG capsule; Take 1-2 capsules (30-60 mg total) by mouth daily. One first week after that 2 daily  Dispense: 60 capsule; Refill: 0

## 2019-07-28 ENCOUNTER — Telehealth: Payer: Self-pay

## 2019-07-28 NOTE — Telephone Encounter (Signed)
Copied from [place] [id number]. Topic: Referral - Status >> Jul 28, 2019  123456 PM Simone Curia D wrote: [id number] Left message on voicemail to return my call regarding grief counseling resource. Ambrose Mantle [phone]

## 2019-07-30 NOTE — Progress Notes (Deleted)
Cardiology Office Note  Date:  07/30/2019   ID:  [name], DOB 1961/07/14, MRN [id number]  PCP:  [name], MD   No chief complaint on file.   HPI:  58 year old male with a history of  CAD status post multiple PCI's,  hypertension,  hyperlipidemia,  type 2 diabetes mellitus,  diabetic neuropathy,  myasthenia gravis, sepsis obstructive sleep apnea,  who presents for follow-up related to recurrent angina.  CAD status post prior myocardial infarction in 2009 with subsequent placement of a total of 10 stents between the LAD and right coronary artery in [place]. Other history includes hypertension, hyperlipidemia, type 2 diabetes mellitus, sinus tachycardia, diabetic neuropathy, myasthenia gravis, GERD, sleep apnea on CPAP, sepsis, and ischemic cardiomyopathy. In June 2019, he had worsening dyspnea on exertion and nitrate responsive angina at rest. An echocardiogram showed an EF of 30 to 35%. Catheterization showed a new 90% stenosis in the mid left circumflex with multiple patent LAD, RCA, and OM 2 stents. The circumflex was successfully treated with a drug-eluting stent and he was placed on guideline directed therapy for cardiomyopathy. In October 2019, he was admitted with disorientation, fever, and vomiting. He was treated for sepsis and during that hospitalization, troponin rose to 11.48. Echocardiogram showed persistent LV dysfunction with an EF of 30 to 35%. Catheterization was deferred in the setting of sepsis. He was readmitted in mid October with recurrent fevers and chills. MRIs of the brain and spine did not show any source of infection while TEE in 12/2019was negative for vegetation (EF 40-45% @ that time).  I saw him via telemedicine visit on May 7, at which time he reported a 2-week history of increasing dyspnea on exertion as well as more frequent episodes of nitrate responsive exertional chest discomfort.  Stress testing was undertaken and was  intermediate, showing multiple areas of apical infarct without new ischemia.  He was seen by Dr. Fletcher Anon via virtual visit on May 15, with ongoing symptoms.  Carvedilol switched to metoprolol in an effort to reduce his heart rate and hopefully treat symptoms.  Since then, he has had an improvement in symptoms.  In addition to a change in beta-blocker, he has been seen by pulmonology and has been receiving a steroid taper due to upper respiratory congestion/asthma.  He believes his symptoms have now returned to baseline, requiring nitrates 2-3 times per week for chest discomfort, which she has for many years.  His dyspnea has improved significantly.  He denies palpitations, PND, orthopnea, dizziness, syncope, edema, or early satiety.  He continues to do his best to maintain social distancing and wear a mask when in public.  He is walking his dog twice a day without significant limitations.      PMH:   has a past medical history of Allergy, Arthritis, Asthma, Cataract, Chronic combined systolic and diastolic congestive heart failure ([place]), Coronary artery disease, Diabetes mellitus without complication ([place]), Diabetic peripheral neuropathy ([place]), FUO (fever of unknown origin) (08/03/2018), GERD (gastroesophageal reflux disease), Headache, Hyperlipidemia, Hypertension, Insomnia, Ischemic cardiomyopathy, Lyme disease, Myasthenia gravis ([place]), Myocardial infarction ([place]) (2010), Seasonal allergies, Sepsis ([place]), and Sleep apnea.  PSH:    Past Surgical History:  Procedure Laterality Date  . BILATERAL CARPAL TUNNEL RELEASE Bilateral L in 2012 and R in 2013  . CARDIAC CATHETERIZATION     Several Caths, most recent in  March 2016.  [name] COLONOSCOPY WITH PROPOFOL N/A 01/10/2016   Procedure: COLONOSCOPY WITH PROPOFOL;  Surgeon: [name], MD;  Location: [facility];  Service: Endoscopy;  Laterality: N/A;  . CORONARY ANGIOPLASTY    . CORONARY STENT INTERVENTION N/A 04/25/2018   Procedure: CORONARY STENT  INTERVENTION;  Surgeon: [name], MD;  Location: [facility];  Service: Cardiovascular;  Laterality: N/A;  . ESOPHAGOGASTRODUODENOSCOPY (EGD) WITH PROPOFOL N/A 01/10/2016   Procedure: ESOPHAGOGASTRODUODENOSCOPY (EGD) WITH PROPOFOL;  Surgeon: [name], MD;  Location: [facility];  Service: Endoscopy;  Laterality: N/A;  . EYE SURGERY Bilateral 2012   cataract/bilateral vitrectomies  . LEFT HEART CATH AND CORONARY ANGIOGRAPHY Left 04/25/2018   Procedure: LEFT HEART CATH AND CORONARY ANGIOGRAPHY;  Surgeon: [name], MD;  Location: [facility];  Service: Cardiovascular;  Laterality: Left;  . TEE WITHOUT CARDIOVERSION N/A 09/05/2018   Procedure: TRANSESOPHAGEAL ECHOCARDIOGRAM (TEE);  Surgeon: [name], MD;  Location: [facility];  Service: Cardiovascular;  Laterality: N/A;  . TONSILLECTOMY AND ADENOIDECTOMY     As a child  . TUNNELED VENOUS CATHETER PLACEMENT     removed    Current Outpatient Medications  Medication Sig Dispense Refill  . albuterol (PROVENTIL HFA;VENTOLIN HFA) 108 (90 Base) MCG/ACT inhaler Inhale 2 puffs into the lungs every 6 (six) hours as needed for wheezing or shortness of breath.    [name] albuterol (PROVENTIL) (2.5 MG/3ML) 0.083% nebulizer solution Take 2.5 mg by nebulization every 6 (six) hours as needed for wheezing or shortness of breath.    [name] aspirin EC 81 MG tablet Take 1 tablet (81 mg total) by mouth daily. 90 tablet 3  . betamethasone valerate ointment (VALISONE) 0.1 % Apply topically.    . cholecalciferol (VITAMIN D3) 25 MCG (1000 UT) tablet Take 2,000 Units by mouth daily.    . clopidogrel (PLAVIX) 75 MG tablet Take 1 tablet (75 mg total) by mouth daily with breakfast. 30 tablet 6  . cyclobenzaprine (FLEXERIL) 10 MG tablet Take 1 tablet by mouth 2 (two) times daily.    [name] DEXILANT 60 MG capsule TAKE ONE CAPSULE BY MOUTH DAILY 30 capsule 11  . DULoxetine (CYMBALTA) 30 MG capsule Take 1-2 capsules (30-60 mg total) by mouth daily.  One first week after that 2 daily 60 capsule 0  . ezetimibe (ZETIA) 10 MG tablet Take 1 tablet (10 mg total) by mouth daily. 90 tablet 3  . fluticasone (FLOVENT HFA) 220 MCG/ACT inhaler Inhale into the lungs.    . fluticasone furoate-vilanterol (BREO ELLIPTA) 200-25 MCG/INH AEPB Inhale 1 puff into the lungs daily. 3 each 1  . furosemide (LASIX) 20 MG tablet Take 20 mg by mouth every other day.    . gabapentin (NEURONTIN) 300 MG capsule Take 1 capsule (300 mg total) by mouth 3 (three) times daily. 90 capsule 1  . gabapentin (NEURONTIN) 800 MG tablet Take 800 mg by mouth at bedtime.    . insulin lispro (HUMALOG KWIKPEN) 100 UNIT/ML KiwkPen Inject 7-8 Units into the skin 3 (three) times daily. Reported on 12/02/2015/ sliding scale 1 unit for every 8 units of carbs; and 1 unit for every 20 above 120    . Insulin Pen Needle (FIFTY50 PEN NEEDLES) 32G X 4 MM MISC Inject 1 each into the skin as needed.    [name] LANTUS SOLOSTAR 100 UNIT/ML Solostar Pen Inject 25-30 Units into the skin daily at 10 pm.   0  . levocetirizine (XYZAL) 5 MG tablet TAKE 1 TABLET(5 MG) BY MOUTH EVERY EVENING 30 tablet 3  . levothyroxine (SYNTHROID) 25 MCG tablet Take 1 tablet (25 mcg total) by mouth daily before  breakfast. 30 tablet 1  . liraglutide (VICTOZA) 18 MG/3ML SOPN 1.8 units every morning (Patient taking differently: Inject 1.8 mg into the skin daily. )    . loratadine (CLARITIN) 10 MG tablet Take 10 mg by mouth daily.     [name] losartan (COZAAR) 25 MG tablet Take 1 tablet (25 mg total) by mouth daily. 30 tablet 5  . metFORMIN (GLUCOPHAGE) 1000 MG tablet Take 1,000 mg by mouth 2 (two) times daily with a meal.    . metoprolol tartrate (LOPRESSOR) 50 MG tablet Take 1 tablet by mouth 2 (two) times daily.    . montelukast (SINGULAIR) 10 MG tablet Take 10 mg by mouth at bedtime.    . Multiple Vitamins-Minerals (MENS MULTI VITAMIN & MINERAL PO) Take 1 tablet by mouth daily.    . naloxone (NARCAN) nasal spray 4 mg/0.1 mL For excess  sedation from opioids 1 kit 2  . nitroGLYCERIN (NITROSTAT) 0.3 MG SL tablet Place 1 tablet (0.3 mg total) under the tongue every 5 (five) minutes as needed for chest pain. Maximum 3 doses. 25 tablet 3  . Oxycodone HCl 10 MG TABS Take 1 tablet (10 mg total) by mouth 4 (four) times daily. 120 tablet 0  . [START ON 08/18/2019] Oxycodone HCl 10 MG TABS Take 1 tablet (10 mg total) by mouth 4 (four) times daily. 120 tablet 0  . pyridostigmine (MESTINON) 60 MG tablet Take 1 tablet by mouth 3 (three) times daily.    . rosuvastatin (CRESTOR) 5 MG tablet Take 5 mg by mouth daily.  0  . tamsulosin (FLOMAX) 0.4 MG CAPS capsule TAKE 1 CAPSULE(0.4 MG) BY MOUTH DAILY 30 capsule 0   No current facility-administered medications for this visit.      Allergies:   Azathioprine and Novolog [insulin aspart]   Social History:  The patient  reports that he has never smoked. He has never used smokeless tobacco. He reports previous alcohol use. He reports that he does not use drugs.   Family History:   family history includes Cancer in his father; Dementia in his father; Diabetes in his brother and mother; Healthy in his brother and brother; Heart disease in his mother.    Review of Systems: ROS   PHYSICAL EXAM: VS:  There were no vitals taken for this visit. , BMI There is no height or weight on file to calculate BMI. GEN: Well nourished, well developed, in no acute distress HEENT: normal Neck: no JVD, carotid bruits, or masses Cardiac: RRR; no murmurs, rubs, or gallops,no edema  Respiratory:  clear to auscultation bilaterally, normal work of breathing GI: soft, nontender, nondistended, + BS MS: no deformity or atrophy Skin: warm and dry, no rash Neuro:  Strength and sensation are intact Psych: euthymic mood, full affect    Recent Labs: 02/03/2019: B Natriuretic Peptide 56.0; Hemoglobin 11.5; Platelets 178 05/05/2019: ALT 14; BUN 33; Creat 1.54; Potassium 5.1; Sodium 136    Lipid Panel Lab Results   Component Value Date   CHOL 109 05/18/2018   HDL 37 05/18/2018   LDLCALC 50 05/18/2018   TRIG 112 05/18/2018      Wt Readings from Last 3 Encounters:  07/21/19 246 lb 9.6 oz (111.9 kg)  07/18/19 241 lb 4.8 oz (109.5 kg)  06/14/19 234 lb 8 oz (106.4 kg)       ASSESSMENT AND PLAN:  Problem List Items Addressed This Visit    None       Disposition:   F/U  12 months  Total encounter time more than 25 minutes  Greater than 50% was spent in counseling and coordination of care with the patient    Signed, Esmond Plants, M.D., Ph.D. [place], [place]

## 2019-07-31 ENCOUNTER — Ambulatory Visit: Payer: Medicare Other | Admitting: Anesthesiology

## 2019-07-31 ENCOUNTER — Other Ambulatory Visit: Payer: Self-pay

## 2019-07-31 ENCOUNTER — Encounter: Payer: Self-pay | Admitting: Anesthesiology

## 2019-07-31 NOTE — Progress Notes (Unsigned)
Safety precautions to be maintained throughout the outpatient stay will include: orient to surroundings, keep bed in low position, maintain call bell within reach at all times, provide assistance with transfer out of bed and ambulation.  

## 2019-08-01 ENCOUNTER — Ambulatory Visit: Payer: Medicare Other | Admitting: Cardiovascular Disease

## 2019-08-02 DIAGNOSIS — H35373 Puckering of macula, bilateral: Secondary | ICD-10-CM | POA: Diagnosis not present

## 2019-08-02 DIAGNOSIS — E113593 Type 2 diabetes mellitus with proliferative diabetic retinopathy without macular edema, bilateral: Secondary | ICD-10-CM | POA: Diagnosis not present

## 2019-08-02 LAB — HM DIABETES EYE EXAM

## 2019-08-03 ENCOUNTER — Ambulatory Visit: Payer: Medicare Other | Admitting: Physical Therapy

## 2019-08-03 NOTE — Progress Notes (Signed)
Cardiology Office Note    Date:  08/07/2019   ID:  KAITO DIMLER, DOB 02-11-1961, MRN [id number]  PCP:  [name], MD  Cardiologist:  [name], MD  Electrophysiologist:  None   Chief Complaint: Follow up  History of Present Illness:   [name] is a 58 y.o. male with history of CAD s/p multiple PCIs, HFrEF secondary to ICM, DM2 with diabetic neuropathy, HTN, HLD, myasthenia gravis, hypothyroidism, GERD, DDD, and OSA on CPAP who presents for follow-up of his cardiomyopathy.   Patient was admitted to the hospital in 2009 with an MI, having subsequently undergone a total of 10 stents between the LAD and RCA in [place]. More recently, in 03/2018, he had worsening DOE and nitrate responsive angina at rest. Echo showed an EF of 30-35%. Diagnostic cath showed a new 90% stenosis in the mid LCx with multiple patent stents in the LAD, RCA and OM2. The LCx was successfully treated with PCI/DES. In 07/2018, he was admitted with fever, confusion, vomiting and was treated for sepsis with a troponin peaking at 11.48. Echo showed persistent LV dysfunction with an EF of 30-35%. Cath was deferred in the setting of sepsis. He was readmitted in 07/2018 with recurrent fever and chills. MRI of the brain/spine did not show any source of infection. Subsequent TEE in 09/2018 did not reveal an valvular vegetation with an EF of 40-45% at that time. He was seen in 02/2019 with increasing DOE as well as more episodes of nitrate responsive exertional chest pain. Nuclear stress test was intermediate risk, showing multiple areas of apical infarct without new ischemia. In follow up, he continued to note symptoms leading his Coreg to be changed to metoprolol in an effort to improve heart rates and potentially treat his symptoms. With this, when he was seen in follow up in 03/2019, he noted an improvement in symptoms. He also reported having been seen by pulmonology and placed on a steroid taper for asthma/URI. At  his visit in 03/2019, he felt his symptoms had returned to baseline, requiring 2-3 nitro/week, which was his baseline for many years. He called the office in 06/2019 with soft BP and dizziness. In this setting, Entresto was changed to losartan.   He comes in doing reasonably well.  He indicates he has recently been diagnosed with hypothyroidism and started on levothyroxine as well as vitamin B12 and vitamin D deficiencies.  With this, he has noted a gradual increase in his weight.  He was seen by his PCP in mid October, 2020 with a weight of 246 pounds.  He comes in today weighing 250 pounds which is 18 pounds up from his visit in 12/2018.  He denies any increased lower extremity swelling, abdominal distention/fullness, orthopnea, PND, shortness of breath, or early satiety.  There have been no changes to his diet.  He reports compliance with medications.  Over the past month he has noted some difficulty in starting his urinary stream, however once he gets started he has no difficulty voiding.  He has not been evaluated for this.  Lastly, he notes intermittent left upper extremity swelling, particularly first thing in the morning that improves as the day progresses.  He feels like this may be in the setting of his left arm hanging off the side of the bed.  No associated numbness, tingling, or coolness of the left upper extremity.  No dizziness, presyncope, syncope.  He feels like he has a stable angina continues to improve where he is  now needing sublingual nitroglycerin maybe twice per month when he was previously using it 4 times per month.  No changes in his functional status.  Dizziness has resolved following transition from Entresto to losartan.  His father was recently diagnosed with colorectal cancer in [place].  He is under increased stress at home as his son is living with him.  He indicates there are 5 adults living in a 900 square foot house.   Labs: 04/2019 - A1c 7.9%, BUN 33, SCr 1.54, potassium 5.1,  albumin 4.0, AST/ALT normal, HGB 11.5, PLT 178 05/2018 - TC 109, TG 112, HDL 37, LDL 50   Past Medical History:  Diagnosis Date   Allergy    dust, seasonal (worse in the fall).   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 Followed by Pain Specialist in [place], back and neck   Asthma    BRONCHITIS   Cataract    First Dx in 2012   Chronic combined systolic and diastolic congestive heart failure ([place])    a. 03/2018 Echo: EF 30-35%, ant, antlat, apical AK, Gr1 DD; b. 07/2018 Echo: EF 35-40%, anteroseptal, apical, and ant HK. Gr1 DD; c. 09/2019 TEE: EF 40-45%.   Coronary artery disease    a. Prior Ant MI->s/p multiple stents placed in the LAD and right coronary artery ([place]); b. 2016 Cath: reportedly nonobs dzs;  c. 04/2018 Cath/PCI: LM nl, LAD 20p, patent mid stent, LCX 44m(3.25x15 [place] DES), OM1 nl, OM2 50, OM3 40 w/ patent stent, RCA 40p, 72m, 40d w/ patent stent in RPDA, RPAV 60, EF 25-35%. 2+MR; d. 02/2019 MV: Apical scar, no isch, EF 30-44%.   Diabetes mellitus without complication ([place])    TYPE 2   Diabetic peripheral neuropathy (HCC)    feet and hands   FUO (fever of unknown origin) 08/03/2018   GERD (gastroesophageal reflux disease)    Headache    muscle tension   Hyperlipidemia    Hypertension    CONTROLLED ON MEDS   Insomnia    Ischemic cardiomyopathy    a. 03/2018 Echo: EF 30-35%, ant, antlat, apical AK, Gr1 DD, mild MR, mildly dil LA; b. 07/2018 Echo: EF 35-40%; c. 09/2018 TEE: EF 40-45%, antsept, ant HK.   Lyme disease    Chronic   Myasthenia gravis ([place])    Myocardial infarction ([place]) 2010   Seasonal allergies    Sepsis ([place])    a.07/2018 - unknown source. TEE neg for veg 09/2019.   Sleep apnea    CPAP    Past Surgical History:  Procedure Laterality Date   BILATERAL CARPAL TUNNEL RELEASE Bilateral L in 2012 and R in 2013   [place], most recent in  March 2016.   COLONOSCOPY WITH PROPOFOL N/A 01/10/2016   Procedure:  COLONOSCOPY WITH PROPOFOL;  Surgeon: [name], MD;  Location: [facility];  Service: Endoscopy;  Laterality: N/A;   CORONARY ANGIOPLASTY     CORONARY STENT INTERVENTION N/A 04/25/2018   Procedure: CORONARY STENT INTERVENTION;  Surgeon: [name], MD;  Location: [facility];  Service: Cardiovascular;  Laterality: N/A;   ESOPHAGOGASTRODUODENOSCOPY (EGD) WITH PROPOFOL N/A 01/10/2016   Procedure: ESOPHAGOGASTRODUODENOSCOPY (EGD) WITH PROPOFOL;  Surgeon: [name], MD;  Location: [facility];  Service: Endoscopy;  Laterality: N/A;   EYE SURGERY Bilateral 2012   cataract/bilateral vitrectomies   LEFT HEART CATH AND CORONARY ANGIOGRAPHY Left 04/25/2018   Procedure: LEFT HEART CATH AND CORONARY ANGIOGRAPHY;  Surgeon: [name],  MD;  Location: [facility];  Service: Cardiovascular;  Laterality: Left;   TEE WITHOUT CARDIOVERSION N/A 09/05/2018   Procedure: TRANSESOPHAGEAL ECHOCARDIOGRAM (TEE);  Surgeon: [name], MD;  Location: [facility];  Service: Cardiovascular;  Laterality: N/A;   TONSILLECTOMY AND ADENOIDECTOMY     As a child   TUNNELED VENOUS CATHETER PLACEMENT     removed    Current Medications: Current Meds  Medication Sig   albuterol (PROVENTIL HFA;VENTOLIN HFA) 108 (90 Base) MCG/ACT inhaler Inhale 2 puffs into the lungs every 6 (six) hours as needed for wheezing or shortness of breath.   albuterol (PROVENTIL) (2.5 MG/3ML) 0.083% nebulizer solution Take 2.5 mg by nebulization every 6 (six) hours as needed for wheezing or shortness of breath.   aspirin EC 81 MG tablet Take 1 tablet (81 mg total) by mouth daily.   betamethasone valerate ointment (VALISONE) 0.1 % Apply topically.   cholecalciferol (VITAMIN D3) 25 MCG (1000 UT) tablet Take 2,000 Units by mouth daily.   clopidogrel (PLAVIX) 75 MG tablet Take 1 tablet (75 mg total) by mouth daily with breakfast.   cyclobenzaprine (FLEXERIL) 10 MG tablet Take 1 tablet by mouth 2 (two)  times daily.   DEXILANT 60 MG capsule TAKE ONE CAPSULE BY MOUTH DAILY   DULoxetine (CYMBALTA) 30 MG capsule Take 1-2 capsules (30-60 mg total) by mouth daily. One first week after that 2 daily   ezetimibe (ZETIA) 10 MG tablet Take 1 tablet (10 mg total) by mouth daily.   fluticasone (FLOVENT HFA) 220 MCG/ACT inhaler Inhale into the lungs.   fluticasone furoate-vilanterol (BREO ELLIPTA) 200-25 MCG/INH AEPB Inhale 1 puff into the lungs daily.   furosemide (LASIX) 20 MG tablet Take 20 mg by mouth every other day.   gabapentin (NEURONTIN) 300 MG capsule Take 1 capsule (300 mg total) by mouth 3 (three) times daily.   gabapentin (NEURONTIN) 800 MG tablet Take 800 mg by mouth at bedtime.   insulin lispro (HUMALOG KWIKPEN) 100 UNIT/ML KiwkPen Inject 7-8 Units into the skin 3 (three) times daily. Reported on 12/02/2015/ sliding scale 1 unit for every 8 units of carbs; and 1 unit for every 20 above 120   Insulin Pen Needle (FIFTY50 PEN NEEDLES) 32G X 4 MM MISC Inject 1 each into the skin as needed.   LANTUS SOLOSTAR 100 UNIT/ML Solostar Pen Inject 25-30 Units into the skin daily at 10 pm.    levocetirizine (XYZAL) 5 MG tablet TAKE 1 TABLET(5 MG) BY MOUTH EVERY EVENING   levothyroxine (SYNTHROID) 25 MCG tablet Take 1 tablet (25 mcg total) by mouth daily before breakfast.   liraglutide (VICTOZA) 18 MG/3ML SOPN 1.8 units every morning (Patient taking differently: Inject 1.8 mg into the skin daily. )   loratadine (CLARITIN) 10 MG tablet Take 10 mg by mouth daily.    losartan (COZAAR) 25 MG tablet Take 1 tablet (25 mg total) by mouth daily.   metFORMIN (GLUCOPHAGE) 1000 MG tablet Take 1,000 mg by mouth 2 (two) times daily with a meal.   metoprolol tartrate (LOPRESSOR) 50 MG tablet Take 1 tablet by mouth 2 (two) times daily.   montelukast (SINGULAIR) 10 MG tablet Take 10 mg by mouth at bedtime.   Multiple Vitamins-Minerals (MENS MULTI VITAMIN & MINERAL PO) Take 1 tablet by mouth daily.    naloxone (NARCAN) nasal spray 4 mg/0.1 mL For excess sedation from opioids   nitroGLYCERIN (NITROSTAT) 0.3 MG SL tablet Place 1 tablet (0.3 mg total) under the tongue every 5 (five)  minutes as needed for chest pain. Maximum 3 doses.   Oxycodone HCl 10 MG TABS Take 1 tablet (10 mg total) by mouth 4 (four) times daily.   [START ON 08/18/2019] Oxycodone HCl 10 MG TABS Take 1 tablet (10 mg total) by mouth 4 (four) times daily.   pyridostigmine (MESTINON) 60 MG tablet Take 1 tablet by mouth 3 (three) times daily.   rosuvastatin (CRESTOR) 5 MG tablet Take 5 mg by mouth daily.   tamsulosin (FLOMAX) 0.4 MG CAPS capsule TAKE 1 CAPSULE(0.4 MG) BY MOUTH DAILY    Allergies:   Azathioprine and Novolog [insulin aspart]   Social History   Socioeconomic History   Marital status: Married    Spouse name: Neoma Laming   Number of children: 0   Years of education: Not on file   Highest education level: Master's degree (e.g., MA, MS, MEng, MEd, MSW, MBA)  Occupational History   Occupation: disabled    Comment: multiple medical problems - first approved for uncontrolled DM, but now has heart disease and Myasthenia Gravis   Social Designer, fashion/clothing strain: Not hard at all   Food insecurity    Worry: Never true    Inability: Never true   Transportation needs    Medical: No    Non-medical: No  Tobacco Use   Smoking status: Never Smoker   Smokeless tobacco: Never Used   Tobacco comment: smoking cessation materials not required  Substance and Sexual Activity   Alcohol use: Not Currently    Alcohol/week: 0.0 standard drinks   Drug use: No   Sexual activity: Not Currently    Partners: Female  Lifestyle   Physical activity    Days per week: 7 days    Minutes per session: 30 min   Stress: Only a little  Relationships   Social connections    Talks on phone: More than three times a week    Gets together: Three times a week    Attends religious service: More than 4 times per  year    Active member of club or organization: Yes    Attends meetings of clubs or organizations: More than 4 times per year    Relationship status: Married  Other Topics Concern   Not on file  Social History Narrative   Not on file     Family History:  The patient's family history includes Cancer in his father; Dementia in his father; Diabetes in his brother and mother; Healthy in his brother and brother; Heart disease in his mother.  ROS:   Review of Systems  Constitutional: Positive for malaise/fatigue. Negative for chills, diaphoresis, fever and weight loss.  HENT: Negative for congestion.   Eyes: Negative for discharge and redness.  Respiratory: Negative for cough, hemoptysis, sputum production, shortness of breath and wheezing.   Cardiovascular: Positive for leg swelling. Negative for chest pain, palpitations, orthopnea, claudication and PND.       Stable mild lower extremity swelling  Gastrointestinal: Negative for abdominal pain, blood in stool, heartburn, melena, nausea and vomiting.  Genitourinary: Negative for hematuria.       Difficulty initiating urinary stream  Musculoskeletal: Negative for falls and myalgias.  Skin: Negative for rash.  Neurological: Positive for weakness. Negative for dizziness, tingling, tremors, sensory change, speech change, focal weakness and loss of consciousness.  Endo/Heme/Allergies: Does not bruise/bleed easily.  Psychiatric/Behavioral: Negative for substance abuse. The patient is not nervous/anxious.   All other systems reviewed and are negative.    EKGs/Labs/Other Studies  Reviewed:    Studies reviewed were summarized above. The additional studies were reviewed today: As above.    EKG:  EKG is ordered today.  The EKG ordered today demonstrates NSR, 91 bpm, RBBB, possible old inferior infarct, unchanged from prior  Recent Labs: 02/03/2019: B Natriuretic Peptide 56.0; Hemoglobin 11.5; Platelets 178 05/05/2019: ALT 14; BUN 33; Creat 1.54;  Potassium 5.1; Sodium 136  Recent Lipid Panel    Component Value Date/Time   CHOL 109 05/18/2018   CHOL 113 01/17/2018 0819   TRIG 112 05/18/2018   HDL 37 05/18/2018   HDL 33 (L) 01/17/2018 0819   CHOLHDL 3.4 01/17/2018 0819   CHOLHDL 3.7 11/29/2017 1154   VLDL 26 11/29/2017 1154   LDLCALC 50 05/18/2018   LDLCALC 62 01/17/2018 0819    PHYSICAL EXAM:    VS:  BP 116/60 (BP Location: Left Arm, Patient Position: Sitting, Cuff Size: Normal)    Pulse 91    Ht 5\' 7"  (1.702 m)    Wt 250 lb 4 oz (113.5 kg)    BMI 39.19 kg/m   BMI: Body mass index is 39.19 kg/m.  Physical Exam  Constitutional: He is oriented to person, place, and time. He appears well-developed and well-nourished.  HENT:  Head: Normocephalic and atraumatic.  Eyes: Right eye exhibits no discharge. Left eye exhibits no discharge.  Neck: Normal range of motion. No JVD present.  Cardiovascular: Normal rate, regular rhythm, S1 normal, S2 normal and normal heart sounds. Exam reveals no distant heart sounds, no friction rub, no midsystolic click and no opening snap.  No murmur heard. Pulses:      Radial pulses are 2+ on the right side and 2+ on the left side.       Posterior tibial pulses are 2+ on the right side and 2+ on the left side.  Bilateral brachial pulses 2+  Pulmonary/Chest: Effort normal and breath sounds normal. No respiratory distress. He has no decreased breath sounds. He has no wheezes. He has no rales. He exhibits no tenderness.  Abdominal: Soft. He exhibits no distension. There is no abdominal tenderness.  Musculoskeletal:        General: Edema present.     Comments: Trace bilateral pretibial edema  Neurological: He is alert and oriented to person, place, and time.  Skin: Skin is warm and dry. No cyanosis. Nails show no clubbing.  Psychiatric: He has a normal mood and affect. His speech is normal and behavior is normal. Judgment and thought content normal.    Wt Readings from Last 3 Encounters:  08/07/19  250 lb 4 oz (113.5 kg)  07/31/19 236 lb (107 kg)  07/21/19 246 lb 9.6 oz (111.9 kg)     ASSESSMENT & PLAN:   1. CAD involving the native coronary arteries with stable angina: He is doing well and denies any symptoms concerning for worsening angina.  In fact, he reports his symptoms of angina have decreased for he is now needing sublingual nitroglycerin twice per month compared to a prior usage of 4 times per month.  Continue current therapy including aspirin, Plavix, Zetia, Crestor, losartan, and Lopressor.  2. HFrEF secondary to ICM: He appears euvolemic and well compensated.  Patient's weight is up 18 pounds when compared to his last in office weight with a trend to 232 to 250 pounds without any objective evidence of volume overload on exam with only trace bilateral lower extremity swelling which he indicates is stable, lungs clear to auscultation bilaterally, soft abdomen, and no  evidence of JVD.  He denies any shortness of breath, orthopnea, PND, early satiety.  We will obtain a BMP and BNP today.  Cannot exclude some component of recently diagnosed hyperthyroidism playing a role in his weight gain.  Consider updating echo in follow-up based on labs.  This was deferred at this time given lack of any symptoms.  Continue current dose of Lopressor, losartan, and Lasix.  Not currently on spironolactone in the setting of recent orthostatic dizziness and with high normal potassium.  3. HTN: Blood pressure is well controlled today.  Historically, he has previously had soft BP leading to the recommendation to transition from Entresto back to losartan in 06/2019.  Continue current therapy including Lopressor, losartan, and Lasix.  4. Intermittent left upper extremity swelling: Of uncertain etiology.  Schedule left upper extremity venous duplex, after discussing with ultrasound.  Of note, patient has 2+ bilateral brachial and radial pulses on exam today.  There is no evidence of swelling on exam.  5. HLD:  LDL 50 from 05/2018.  Remains on rosuvastatin and Zetia.  6. DM2: A1c 7.9 from 04/2019.  Consider SGLT2 inhibitor.  Follow-up with PCP.  7. Hypothyroidism: Recently diagnosed.  Follow-up with PCP as directed.  Cannot exclude this playing a potential role in his gradual weight gain.  8. Difficulty voiding: Referral to urology.  Disposition: F/u with Dr. Fletcher Anon or an APP in 1 month.   Medication Adjustments/Labs and Tests Ordered: Current medicines are reviewed at length with the patient today.  Concerns regarding medicines are outlined above. Medication changes, Labs and Tests ordered today are summarized above and listed in the Patient Instructions accessible in Encounters.   Signed, Edgar Faith, PA-C 08/07/2019 2:33 PM     [facility] [address] [place] [address] [place], [place] [address] [phone]

## 2019-08-04 ENCOUNTER — Telehealth: Payer: Self-pay

## 2019-08-04 NOTE — Telephone Encounter (Signed)
Copied from [place] [id number]. Topic: Referral - Status >> Aug 04, 2019  [id number] PM Simone Curia D wrote: [phone] Left message on voicemail to return my call regarding grief counseling resource. Ambrose Mantle [phone]

## 2019-08-07 ENCOUNTER — Other Ambulatory Visit: Payer: Self-pay

## 2019-08-07 ENCOUNTER — Encounter: Payer: Self-pay | Admitting: Physician Assistant

## 2019-08-07 ENCOUNTER — Ambulatory Visit (INDEPENDENT_AMBULATORY_CARE_PROVIDER_SITE_OTHER): Payer: Medicare Other | Admitting: Physician Assistant

## 2019-08-07 VITALS — BP 116/60 | HR 91 | Ht 67.0 in | Wt 250.2 lb

## 2019-08-07 DIAGNOSIS — I25118 Atherosclerotic heart disease of native coronary artery with other forms of angina pectoris: Secondary | ICD-10-CM

## 2019-08-07 DIAGNOSIS — N398 Other specified disorders of urinary system: Secondary | ICD-10-CM

## 2019-08-07 DIAGNOSIS — Z794 Long term (current) use of insulin: Secondary | ICD-10-CM

## 2019-08-07 DIAGNOSIS — I10 Essential (primary) hypertension: Secondary | ICD-10-CM

## 2019-08-07 DIAGNOSIS — I255 Ischemic cardiomyopathy: Secondary | ICD-10-CM | POA: Diagnosis not present

## 2019-08-07 DIAGNOSIS — M7989 Other specified soft tissue disorders: Secondary | ICD-10-CM | POA: Diagnosis not present

## 2019-08-07 DIAGNOSIS — E119 Type 2 diabetes mellitus without complications: Secondary | ICD-10-CM

## 2019-08-07 DIAGNOSIS — E039 Hypothyroidism, unspecified: Secondary | ICD-10-CM

## 2019-08-07 DIAGNOSIS — E785 Hyperlipidemia, unspecified: Secondary | ICD-10-CM

## 2019-08-07 NOTE — Patient Instructions (Signed)
Medication Instructions:  - Your physician recommends that you continue on your current medications as directed. Please refer to the Current Medication list given to you today.  *If you need a refill on your cardiac medications before your next appointment, please call your pharmacy*  Lab Work: - Your physician recommends that you have lab work today: BMP/ BNP  If you have labs (blood work) drawn today and your tests are completely normal, you will receive your results only by: [name] MyChart Message (if you have MyChart) OR . A paper copy in the mail If you have any lab test that is abnormal or we need to change your treatment, we will call you to review the results.  Testing/Procedures: - Your physician has requested that you have an upper extremity venous duplex. This test is an ultrasound of the veins in the arms. It looks at venous blood flow that carries blood from the heart to the arms. Allow thirty minutes for an Upper Venous exam. There are no restrictions or special instructions.  - You have been referred to [facility]- they should contact you directly to schedule an appointment.   Follow-Up: At [facility], you and your health needs are our priority.  As part of our continuing mission to provide you with exceptional heart care, we have created designated Provider Care Teams.  These Care Teams include your primary Cardiologist (physician) and Advanced Practice Providers (APPs -  Physician Assistants and Nurse Practitioners) who all work together to provide you with the care you need, when you need it.  Your next appointment:   1 month  The format for your next appointment:   In Person  Provider:    You may see [name], MD or one of the following Advanced Practice Providers on your designated Care Team:    Murray Hodgkins, NP  Christell Faith, PA-C  Marrianne Mood, PA-C   Other Instructions - n/a

## 2019-08-08 ENCOUNTER — Other Ambulatory Visit: Payer: Self-pay | Admitting: Family Medicine

## 2019-08-08 LAB — BASIC METABOLIC PANEL
BUN/Creatinine Ratio: 19 (ref 10–24)
BUN: 26 mg/dL (ref 8–27)
CO2: 24 mmol/L (ref 20–29)
Calcium: 8.9 mg/dL (ref 8.6–10.2)
Chloride: 106 mmol/L (ref 96–106)
Creatinine, Ser: 1.35 mg/dL — ABNORMAL HIGH (ref 0.76–1.27)
GFR calc Af Amer: 66 mL/min/{1.73_m2} (ref >59)
GFR calc non Af Amer: 57 mL/min/{1.73_m2} — ABNORMAL LOW (ref >59)
Glucose: 161 mg/dL — ABNORMAL HIGH (ref 65–99)
Potassium: 4.9 mmol/L (ref 3.5–5.2)
Sodium: 140 mmol/L (ref 134–144)

## 2019-08-08 LAB — BRAIN NATRIURETIC PEPTIDE: BNP: 84.9 pg/mL (ref 0.0–100.0)

## 2019-08-11 ENCOUNTER — Telehealth: Payer: Self-pay

## 2019-08-11 NOTE — Telephone Encounter (Signed)
Copied from [place] [phone]. Topic: Referral - Status >> Aug 11, 2019  [id number] PM Simone Curia D wrote: [phone] Spoke with patient about [facility] and counseling. Ambrose Mantle [phone]

## 2019-08-15 ENCOUNTER — Ambulatory Visit: Payer: Medicare Other | Admitting: Anesthesiology

## 2019-08-16 ENCOUNTER — Ambulatory Visit (HOSPITAL_BASED_OUTPATIENT_CLINIC_OR_DEPARTMENT_OTHER): Payer: Medicare Other | Admitting: Anesthesiology

## 2019-08-16 ENCOUNTER — Other Ambulatory Visit: Payer: Self-pay

## 2019-08-16 ENCOUNTER — Encounter: Payer: Self-pay | Admitting: Anesthesiology

## 2019-08-16 ENCOUNTER — Other Ambulatory Visit: Payer: Self-pay | Admitting: Anesthesiology

## 2019-08-16 ENCOUNTER — Ambulatory Visit
Admission: RE | Admit: 2019-08-16 | Discharge: 2019-08-16 | Disposition: A | Discharge status: Home / Self Care | Payer: Medicare Other | Source: Ambulatory Visit | Attending: Anesthesiology | Admitting: Anesthesiology

## 2019-08-16 VITALS — BP 132/94 | HR 89 | Temp 97.7°F | Resp 18 | Ht 67.0 in | Wt 240.0 lb

## 2019-08-16 DIAGNOSIS — G7000 Myasthenia gravis without (acute) exacerbation: Secondary | ICD-10-CM

## 2019-08-16 DIAGNOSIS — M4716 Other spondylosis with myelopathy, lumbar region: Secondary | ICD-10-CM | POA: Insufficient documentation

## 2019-08-16 DIAGNOSIS — M47817 Spondylosis without myelopathy or radiculopathy, lumbosacral region: Secondary | ICD-10-CM | POA: Insufficient documentation

## 2019-08-16 DIAGNOSIS — M542 Cervicalgia: Secondary | ICD-10-CM | POA: Insufficient documentation

## 2019-08-16 DIAGNOSIS — R52 Pain, unspecified: Secondary | ICD-10-CM | POA: Diagnosis not present

## 2019-08-16 DIAGNOSIS — M5432 Sciatica, left side: Secondary | ICD-10-CM | POA: Insufficient documentation

## 2019-08-16 DIAGNOSIS — M5136 Other intervertebral disc degeneration, lumbar region: Secondary | ICD-10-CM | POA: Diagnosis not present

## 2019-08-16 DIAGNOSIS — G894 Chronic pain syndrome: Secondary | ICD-10-CM

## 2019-08-16 DIAGNOSIS — F1190 Opioid use, unspecified, uncomplicated: Secondary | ICD-10-CM | POA: Insufficient documentation

## 2019-08-16 MED ORDER — ROPIVACAINE HCL 2 MG/ML IJ SOLN
INTRAMUSCULAR | Status: AC
  Filled 2019-08-16: qty 10

## 2019-08-16 MED ORDER — TRIAMCINOLONE ACETONIDE 40 MG/ML IJ SUSP
40.0000 mg | Freq: Once | INTRAMUSCULAR | Status: AC
  Administered 2019-08-16: 14:00:00 40 mg

## 2019-08-16 MED ORDER — SODIUM CHLORIDE 0.9% FLUSH: 10.0000 mL | Freq: Once | INTRAVENOUS | Status: DC

## 2019-08-16 MED ORDER — IOPAMIDOL (ISOVUE-M 200) INJECTION 41%
20.0000 mL | Freq: Once | INTRAMUSCULAR | Status: DC | PRN
  Administered 2019-08-16: 14:00:00 20 mL
  Filled 2019-08-16: qty 20

## 2019-08-16 MED ORDER — TRIAMCINOLONE ACETONIDE 40 MG/ML IJ SUSP
INTRAMUSCULAR | Status: AC
  Filled 2019-08-16: qty 1

## 2019-08-16 MED ORDER — IOHEXOL 180 MG/ML  SOLN
INTRAMUSCULAR | Status: AC
  Filled 2019-08-16: qty 20

## 2019-08-16 MED ORDER — LIDOCAINE HCL (PF) 1 % IJ SOLN
INTRAMUSCULAR | Status: AC
  Filled 2019-08-16: qty 10

## 2019-08-16 MED ORDER — LIDOCAINE HCL (PF) 1 % IJ SOLN
5.0000 mL | Freq: Once | INTRAMUSCULAR | Status: AC
  Administered 2019-08-16: 5 mL via SUBCUTANEOUS

## 2019-08-16 MED ORDER — ROPIVACAINE HCL 2 MG/ML IJ SOLN
10.0000 mL | Freq: Once | INTRAMUSCULAR | Status: AC
  Administered 2019-08-16: 10 mL via EPIDURAL

## 2019-08-16 MED ORDER — SODIUM CHLORIDE (PF) 0.9 % IJ SOLN
INTRAMUSCULAR | Status: AC
  Filled 2019-08-16: qty 10

## 2019-08-16 MED ORDER — OXYCODONE HCL 10 MG PO TABS: 10.0000 mg | ORAL_TABLET | Freq: Four times a day (QID) | ORAL | 0 refills | Status: DC

## 2019-08-16 NOTE — Progress Notes (Signed)
Safety precautions to be maintained throughout the outpatient stay will include: orient to surroundings, keep bed in low position, maintain call bell within reach at all times, provide assistance with transfer out of bed and ambulation.  

## 2019-08-16 NOTE — Patient Instructions (Signed)
Pain Management Discharge Instructions  General Discharge Instructions :  If you need to reach your doctor call: Monday-Friday 8:00 am - 4:00 pm at [phone] or toll free [phone].  After clinic hours [phone] to have operator reach doctor.  Bring all of your medication bottles to all your appointments in the pain clinic.  To cancel or reschedule your appointment with Pain Management please remember to call 24 hours in advance to avoid a fee.  Refer to the educational materials which you have been given on: General Risks, I had my Procedure. Discharge Instructions, Post Sedation.  Post Procedure Instructions:  The drugs you were given will stay in your system until tomorrow, so for the next 24 hours you should not drive, make any legal decisions or drink any alcoholic beverages.  You may eat anything you prefer, but it is better to start with liquids then soups and crackers, and gradually work up to solid foods.  Please notify your doctor immediately if you have any unusual bleeding, trouble breathing or pain that is not related to your normal pain.  Depending on the type of procedure that was done, some parts of your body may feel week and/or numb.  This usually clears up by tonight or the next day.  Walk with the use of an assistive device or accompanied by an adult for the 24 hours.  You may use ice on the affected area for the first 24 hours.  Put ice in a Ziploc bag and cover with a towel and place against area 15 minutes on 15 minutes off.  You may switch to heat after 24 hours.Epidural Steroid Injection Patient Information  Description: The epidural space surrounds the nerves as they exit the spinal cord.  In some patients, the nerves can be compressed and inflamed by a bulging disc or a tight spinal canal (spinal stenosis).  By injecting steroids into the epidural space, we can bring irritated nerves into direct contact with a potentially helpful medication.  These  steroids act directly on the irritated nerves and can reduce swelling and inflammation which often leads to decreased pain.  Epidural steroids may be injected anywhere along the spine and from the neck to the low back depending upon the location of your pain.   After numbing the skin with local anesthetic (like Novocaine), a small needle is passed into the epidural space slowly.  You may experience a sensation of pressure while this is being done.  The entire block usually last less than 10 minutes.  Conditions which may be treated by epidural steroids:   Low back and leg pain  Neck and arm pain  Spinal stenosis  Post-laminectomy syndrome  Herpes zoster (shingles) pain  Pain from compression fractures  Preparation for the injection:  1. Do not eat any solid food or dairy products within 8 hours of your appointment.  2. You may drink clear liquids up to 3 hours before appointment.  Clear liquids include water, black coffee, juice or soda.  No milk or cream please. 3. You may take your regular medication, including pain medications, with a sip of water before your appointment  Diabetics should hold regular insulin (if taken separately) and take 1/2 normal NPH dos the morning of the procedure.  Carry some sugar containing items with you to your appointment. 4. A driver must accompany you and be prepared to drive you home after your procedure.  5. Bring all your current medications with your. 6. An IV may be inserted and   sedation may be given at the discretion of the physician.   7. A blood pressure cuff, EKG and other monitors will often be applied during the procedure.  Some patients may need to have extra oxygen administered for a short period. 8. You will be asked to provide medical information, including your allergies, prior to the procedure.  We must know immediately if you are taking blood thinners (like Coumadin/Warfarin)  Or if you are allergic to IV iodine contrast (dye). We must  know if you could possible be pregnant.  Possible side-effects:  Bleeding from needle site  Infection (rare, may require surgery)  Nerve injury (rare)  Numbness & tingling (temporary)  Difficulty urinating (rare, temporary)  Spinal headache ( a headache worse with upright posture)  Light -headedness (temporary)  Pain at injection site (several days)  Decreased blood pressure (temporary)  Weakness in arm/leg (temporary)  Pressure sensation in back/neck (temporary)  Call if you experience:  Fever/chills associated with headache or increased back/neck pain.  Headache worsened by an upright position.  New onset weakness or numbness of an extremity below the injection site  Hives or difficulty breathing (go to the emergency room)  Inflammation or drainage at the infection site  Severe back/neck pain  Any new symptoms which are concerning to you  Please note:  Although the local anesthetic injected can often make your back or neck feel good for several hours after the injection, the pain will likely return.  It takes 3-7 days for steroids to work in the epidural space.  You may not notice any pain relief for at least that one week.  If effective, we will often do a series of three injections spaced 3-6 weeks apart to maximally decrease your pain.  After the initial series, we generally will wait several months before considering a repeat injection of the same type.  If you have any questions, please call [phone] [facility] 

## 2019-08-16 NOTE — Progress Notes (Signed)
Subjective:  Patient ID: [name], male    DOB: 18-May-1961  Age: 58 y.o. MRN: [id number]  CC: Back Pain   Procedure: L5-S1 epidural steroid under fluoroscopic guidance with no sedation  HPI [name] presents for reevaluation.  He had a previous epidural in August of this year and reports approximately 75 to 80% improvement in his low back pain and left calf pain.  He has had some gradual recurrence of that same quality pain and presents today for repeat epidural injection.  Otherwise he is in his usual state of health with no changes in lower extremity strength or function or bowel or bladder function.  This is been stable.  He is taking his medications as prescribed and these are continue to work well for him.  Based on our discussion he derives good functional improvement with the medications and no side effects are reported.  Outpatient Medications Prior to Visit  Medication Sig Dispense Refill  . albuterol (PROVENTIL HFA;VENTOLIN HFA) 108 (90 Base) MCG/ACT inhaler Inhale 2 puffs into the lungs every 6 (six) hours as needed for wheezing or shortness of breath.    [name] albuterol (PROVENTIL) (2.5 MG/3ML) 0.083% nebulizer solution Take 2.5 mg by nebulization every 6 (six) hours as needed for wheezing or shortness of breath.    [name] aspirin EC 81 MG tablet Take 1 tablet (81 mg total) by mouth daily. 90 tablet 3  . betamethasone valerate ointment (VALISONE) 0.1 % Apply topically.    . cholecalciferol (VITAMIN D3) 25 MCG (1000 UT) tablet Take 2,000 Units by mouth daily.    . clopidogrel (PLAVIX) 75 MG tablet Take 1 tablet (75 mg total) by mouth daily with breakfast. (Patient taking differently: Take 75 mg by mouth daily with breakfast. Pt stop taking 10 day ago for procedure) 30 tablet 6  . Cyanocobalamin (B-12 COMPLIANCE INJECTION) 1000 MCG/ML KIT Inject 1,000 mcg as directed every 30 (thirty) days.    . cyclobenzaprine (FLEXERIL) 10 MG tablet Take 1 tablet by mouth 2 (two) times daily.     [name] DEXILANT 60 MG capsule TAKE ONE CAPSULE BY MOUTH DAILY 30 capsule 11  . DULoxetine (CYMBALTA) 30 MG capsule Take 1-2 capsules (30-60 mg total) by mouth daily. One first week after that 2 daily 60 capsule 0  . ezetimibe (ZETIA) 10 MG tablet Take 1 tablet (10 mg total) by mouth daily. 90 tablet 3  . fluticasone (FLOVENT HFA) 220 MCG/ACT inhaler Inhale into the lungs.    . fluticasone furoate-vilanterol (BREO ELLIPTA) 200-25 MCG/INH AEPB Inhale 1 puff into the lungs daily. 3 each 1  . furosemide (LASIX) 20 MG tablet Take 20 mg by mouth every other day.    . gabapentin (NEURONTIN) 300 MG capsule Take 1 capsule (300 mg total) by mouth 3 (three) times daily. 90 capsule 1  . gabapentin (NEURONTIN) 800 MG tablet Take 800 mg by mouth at bedtime.    . insulin lispro (HUMALOG KWIKPEN) 100 UNIT/ML KiwkPen Inject 7-8 Units into the skin 3 (three) times daily. Reported on 12/02/2015/ sliding scale 1 unit for every 8 units of carbs; and 1 unit for every 20 above 120    . Insulin Pen Needle (FIFTY50 PEN NEEDLES) 32G X 4 MM MISC Inject 1 each into the skin as needed.    [name] LANTUS SOLOSTAR 100 UNIT/ML Solostar Pen Inject 25-30 Units into the skin daily at 10 pm.   0  . levocetirizine (XYZAL) 5 MG tablet TAKE 1 TABLET(5 MG) BY MOUTH  EVERY EVENING 30 tablet 3  . levothyroxine (SYNTHROID) 25 MCG tablet TAKE 1 TABLET(25 MCG) BY MOUTH DAILY BEFORE BREAKFAST 90 tablet 0  . liraglutide (VICTOZA) 18 MG/3ML SOPN 1.8 units every morning (Patient taking differently: Inject 1.8 mg into the skin daily. )    . loratadine (CLARITIN) 10 MG tablet Take 10 mg by mouth daily.     [name] losartan (COZAAR) 25 MG tablet Take 1 tablet (25 mg total) by mouth daily. 30 tablet 5  . metFORMIN (GLUCOPHAGE) 1000 MG tablet Take 1,000 mg by mouth 2 (two) times daily with a meal.    . metoprolol tartrate (LOPRESSOR) 50 MG tablet Take 1 tablet by mouth 2 (two) times daily.    . montelukast (SINGULAIR) 10 MG tablet Take 10 mg by mouth at bedtime.     . Multiple Vitamins-Minerals (MENS MULTI VITAMIN & MINERAL PO) Take 1 tablet by mouth daily.    . naloxone (NARCAN) nasal spray 4 mg/0.1 mL For excess sedation from opioids 1 kit 2  . nitroGLYCERIN (NITROSTAT) 0.3 MG SL tablet Place 1 tablet (0.3 mg total) under the tongue every 5 (five) minutes as needed for chest pain. Maximum 3 doses. 25 tablet 3  . [START ON 08/18/2019] Oxycodone HCl 10 MG TABS Take 1 tablet (10 mg total) by mouth 4 (four) times daily. 120 tablet 0  . pyridostigmine (MESTINON) 60 MG tablet Take 1 tablet by mouth 3 (three) times daily.    . rosuvastatin (CRESTOR) 5 MG tablet Take 5 mg by mouth daily.  0  . tamsulosin (FLOMAX) 0.4 MG CAPS capsule TAKE 1 CAPSULE(0.4 MG) BY MOUTH DAILY 30 capsule 0  . Oxycodone HCl 10 MG TABS Take 1 tablet (10 mg total) by mouth 4 (four) times daily. 120 tablet 0   No facility-administered medications prior to visit.     Review of Systems CNS: No confusion or sedation Cardiac: No angina or palpitations GI: No abdominal pain or constipation Constitutional: No nausea vomiting fevers or chills  Objective:  BP (!) 143/87   Pulse 94   Temp 97.7 F (36.5 C)   Ht 5' 7" (1.702 m)   Wt 240 lb (108.9 kg)   SpO2 100%   BMI 37.59 kg/m    BP Readings from Last 3 Encounters:  08/16/19 (!) 143/87  08/07/19 116/60  07/31/19 129/72     Wt Readings from Last 3 Encounters:  08/16/19 240 lb (108.9 kg)  08/07/19 250 lb 4 oz (113.5 kg)  07/31/19 236 lb (107 kg)     Physical Exam Pt is alert and oriented PERRL EOMI HEART IS RRR no murmur or rub LCTA no wheezing or rales MUSCULOSKELETAL reveals some paraspinous muscle tenderness but no overt trigger points.  He still does have pain with extension at the low back and left lateral rotation.  He does have a straight leg raise on the left side with an antalgic gait upon movement but his muscle tone and bulk is at baseline.  Labs  Lab Results  Component Value Date   HGBA1C 7.9 (H)  05/05/2019   HGBA1C 7.8 10/06/2018   HGBA1C 7.9 (A) 06/01/2018   Lab Results  Component Value Date   MICROALBUR 1.4 05/05/2019   LDLCALC 50 05/18/2018   CREATININE 1.35 (H) 08/07/2019    -------------------------------------------------------------------------------------------------------------------- Lab Results  Component Value Date   WBC 5.9 02/03/2019   HGB 11.5 (L) 02/03/2019   HCT 35.9 (L) 02/03/2019   PLT 178 02/03/2019   GLUCOSE 161 (H) 08/07/2019  CHOL 109 05/18/2018   TRIG 112 05/18/2018   HDL 37 05/18/2018   LDLCALC 50 05/18/2018   ALT 14 05/05/2019   AST 16 05/05/2019   NA 140 08/07/2019   K 4.9 08/07/2019   CL 106 08/07/2019   CREATININE 1.35 (H) 08/07/2019   BUN 26 08/07/2019   CO2 24 08/07/2019   TSH 4.130 11/25/2017   PSA 0.7 05/05/2019   INR 1.26 07/07/2018   HGBA1C 7.9 (H) 05/05/2019   MICROALBUR 1.4 05/05/2019    --------------------------------------------------------------------------------------------------------------------- No results found.   Assessment & Plan:   There are no diagnoses linked to this encounter.    See below 1. DDD (degenerative disc disease), lumbar   2. Sciatica, left side   3. Lumbar spondylosis with myelopathy   4. Chronic pain syndrome   5. Cervicalgia   6. Myasthenia gravis ([place])   7. Chronic, continuous use of opioids   8. Facet arthritis of lumbosacral region     ----------------------------------------------------------------------------------------------------------------------  Problem List Items Addressed This Visit    None        ----------------------------------------------------------------------------------------------------------------------  There are no diagnoses linked to this encounter.   ----------------------------------------------------------------------------------------------------------------------  I am having Kris Hartmann. Gilberto "[name]" maintain his insulin lispro,  loratadine, albuterol, gabapentin, metFORMIN, albuterol, Lantus SoloStar, naloxone, rosuvastatin, Insulin Pen Needle, aspirin EC, liraglutide, nitroGLYCERIN, gabapentin, Dexilant, furosemide, montelukast, cholecalciferol, Breo Ellipta, ezetimibe, losartan, levocetirizine, clopidogrel, pyridostigmine, metoprolol tartrate, fluticasone, cyclobenzaprine, betamethasone valerate ointment, Multiple Vitamins-Minerals (MENS MULTI VITAMIN & MINERAL PO), Oxycodone HCl, DULoxetine, tamsulosin, levothyroxine, B-12 Compliance Injection, and Oxycodone HCl.   Meds ordered this encounter  Medications  . Oxycodone HCl 10 MG TABS    Sig: Take 1 tablet (10 mg total) by mouth 4 (four) times daily.    Dispense:  120 tablet    Refill:  0  . triamcinolone acetonide (KENALOG-40) injection 40 mg  . sodium chloride flush (NS) 0.9 % injection 10 mL  . ropivacaine (PF) 2 mg/mL (0.2%) (NAROPIN) injection 10 mL  . lidocaine (PF) (XYLOCAINE) 1 % injection 5 mL  . iopamidol (ISOVUE-M) 41 % intrathecal injection 20 mL   Patient's Medications  New Prescriptions   No medications on file  Previous Medications   ALBUTEROL (PROVENTIL HFA;VENTOLIN HFA) 108 (90 BASE) MCG/ACT INHALER    Inhale 2 puffs into the lungs every 6 (six) hours as needed for wheezing or shortness of breath.   ALBUTEROL (PROVENTIL) (2.5 MG/3ML) 0.083% NEBULIZER SOLUTION    Take 2.5 mg by nebulization every 6 (six) hours as needed for wheezing or shortness of breath.   ASPIRIN EC 81 MG TABLET    Take 1 tablet (81 mg total) by mouth daily.   BETAMETHASONE VALERATE OINTMENT (VALISONE) 0.1 %    Apply topically.   CHOLECALCIFEROL (VITAMIN D3) 25 MCG (1000 UT) TABLET    Take 2,000 Units by mouth daily.   CLOPIDOGREL (PLAVIX) 75 MG TABLET    Take 1 tablet (75 mg total) by mouth daily with breakfast.   CYANOCOBALAMIN (B-12 COMPLIANCE INJECTION) 1000 MCG/ML KIT    Inject 1,000 mcg as directed every 30 (thirty) days.   CYCLOBENZAPRINE (FLEXERIL) 10 MG TABLET    Take 1  tablet by mouth 2 (two) times daily.   DEXILANT 60 MG CAPSULE    TAKE ONE CAPSULE BY MOUTH DAILY   DULOXETINE (CYMBALTA) 30 MG CAPSULE    Take 1-2 capsules (30-60 mg total) by mouth daily. One first week after that 2 daily   EZETIMIBE (ZETIA) 10 MG TABLET  Take 1 tablet (10 mg total) by mouth daily.   FLUTICASONE (FLOVENT HFA) 220 MCG/ACT INHALER    Inhale into the lungs.   FLUTICASONE FUROATE-VILANTEROL (BREO ELLIPTA) 200-25 MCG/INH AEPB    Inhale 1 puff into the lungs daily.   FUROSEMIDE (LASIX) 20 MG TABLET    Take 20 mg by mouth every other day.   GABAPENTIN (NEURONTIN) 300 MG CAPSULE    Take 1 capsule (300 mg total) by mouth 3 (three) times daily.   GABAPENTIN (NEURONTIN) 800 MG TABLET    Take 800 mg by mouth at bedtime.   INSULIN LISPRO (HUMALOG KWIKPEN) 100 UNIT/ML KIWKPEN    Inject 7-8 Units into the skin 3 (three) times daily. Reported on 12/02/2015/ sliding scale 1 unit for every 8 units of carbs; and 1 unit for every 20 above 120   INSULIN PEN NEEDLE (FIFTY50 PEN NEEDLES) 32G X 4 MM MISC    Inject 1 each into the skin as needed.   LANTUS SOLOSTAR 100 UNIT/ML SOLOSTAR PEN    Inject 25-30 Units into the skin daily at 10 pm.    LEVOCETIRIZINE (XYZAL) 5 MG TABLET    TAKE 1 TABLET(5 MG) BY MOUTH EVERY EVENING   LEVOTHYROXINE (SYNTHROID) 25 MCG TABLET    TAKE 1 TABLET(25 MCG) BY MOUTH DAILY BEFORE BREAKFAST   LIRAGLUTIDE (VICTOZA) 18 MG/3ML SOPN    1.8 units every morning   LORATADINE (CLARITIN) 10 MG TABLET    Take 10 mg by mouth daily.    LOSARTAN (COZAAR) 25 MG TABLET    Take 1 tablet (25 mg total) by mouth daily.   METFORMIN (GLUCOPHAGE) 1000 MG TABLET    Take 1,000 mg by mouth 2 (two) times daily with a meal.   METOPROLOL TARTRATE (LOPRESSOR) 50 MG TABLET    Take 1 tablet by mouth 2 (two) times daily.   MONTELUKAST (SINGULAIR) 10 MG TABLET    Take 10 mg by mouth at bedtime.   MULTIPLE VITAMINS-MINERALS (MENS MULTI VITAMIN & MINERAL PO)    Take 1 tablet by mouth daily.   NALOXONE  (NARCAN) NASAL SPRAY 4 MG/0.1 ML    For excess sedation from opioids   NITROGLYCERIN (NITROSTAT) 0.3 MG SL TABLET    Place 1 tablet (0.3 mg total) under the tongue every 5 (five) minutes as needed for chest pain. Maximum 3 doses.   OXYCODONE HCL 10 MG TABS    Take 1 tablet (10 mg total) by mouth 4 (four) times daily.   PYRIDOSTIGMINE (MESTINON) 60 MG TABLET    Take 1 tablet by mouth 3 (three) times daily.   ROSUVASTATIN (CRESTOR) 5 MG TABLET    Take 5 mg by mouth daily.   TAMSULOSIN (FLOMAX) 0.4 MG CAPS CAPSULE    TAKE 1 CAPSULE(0.4 MG) BY MOUTH DAILY  Modified Medications   Modified Medication Previous Medication   OXYCODONE HCL 10 MG TABS Oxycodone HCl 10 MG TABS      Take 1 tablet (10 mg total) by mouth 4 (four) times daily.    Take 1 tablet (10 mg total) by mouth 4 (four) times daily.  Discontinued Medications   No medications on file   ----------------------------------------------------------------------------------------------------------------------  Follow-up: Return in about 2 months (around 10/16/2019) for med refill.  1. DDD (degenerative disc disease), lumbar   2. Sciatica, left side   3. Lumbar spondylosis with myelopathy   4. Chronic pain syndrome   5. Cervicalgia   6. Myasthenia gravis ([place])   7. Chronic, continuous use of opioids  8. Facet arthritis of lumbosacral region   Based on our discussion today I am going to proceed with a repeat lumbar epidural steroid at the L5-S1 level.  He got good relief with this in the past and desires to proceed with a repeat injection today.  We gone over the risks and benefits of the procedure with him in full detail and all questions are answered.  I want him to continue with physical therapy exercises with stretching strengthening and return to clinic in 2 months via virtual visit if necessary.  I have reviewed the [facility] practitioner database information and it is appropriate.  I am going to refill his opioids for December 13.  He  is to continue follow-up with his primary care physicians for his baseline medical care.   Procedure: Procedure: L5-S1 LESI with fluoroscopic guidance and no moderate sedation  NOTE: The risks, benefits, and expectations of the procedure have been discussed and explained to the patient who was understanding and in agreement with suggested treatment plan. No guarantees were made.  DESCRIPTION OF PROCEDURE: Lumbar epidural steroid injection with no IV Versed, EKG, blood pressure, pulse, and pulse oximetry monitoring. The procedure was performed with the patient in the prone position under fluoroscopic guidance.  Sterile prep x3 was initiated and I then injected subcutaneous lidocaine to the overlying no L5-S1 site after its fluoroscopic identifictation.  Using strict aseptic technique, I then advanced an 18-gauge Tuohy epidural needle in the midline using interlaminar approach via loss-of-resistance to saline technique. There was negative aspiration for heme or  CSF.  I then confirmed position with both AP and Lateral fluoroscan.  2 cc of contrast dye were injected and a  total of 5 mL of Preservative-Free normal saline mixed with 40 mg of Kenalog and 1cc Ropicaine 0.2 percent were injected incrementally via the  epidurally placed needle. The needle was removed. The patient tolerated the injection well and was convalesced and discharged to home in stable condition. Should the patient have any post procedure difficulty they have been instructed on how to contact us for assistance.   Molli Barrows, MD

## 2019-08-16 NOTE — Progress Notes (Signed)
Nursing Pain Medication Assessment:  Safety precautions to be maintained throughout the outpatient stay will include: orient to surroundings, keep bed in low position, maintain call bell within reach at all times, provide assistance with transfer out of bed and ambulation.  Medication Inspection Compliance: Pill count conducted under aseptic conditions, in front of the patient. Neither the pills nor the bottle was removed from the patient's sight at any time. Once count was completed pills were immediately returned to the patient in their original bottle.  Medication: Oxycodone IR Pill/Patch Count: 9 of 120 pills remain Pill/Patch Appearance: Markings consistent with prescribed medication Bottle Appearance: Standard pharmacy container. Clearly labeled. Filled Date: 65 / 14 / 2020 Last Medication intake:  Today

## 2019-08-17 ENCOUNTER — Other Ambulatory Visit: Payer: Self-pay | Admitting: Family Medicine

## 2019-08-17 DIAGNOSIS — F331 Major depressive disorder, recurrent, moderate: Secondary | ICD-10-CM

## 2019-08-17 DIAGNOSIS — F411 Generalized anxiety disorder: Secondary | ICD-10-CM

## 2019-08-18 ENCOUNTER — Ambulatory Visit (INDEPENDENT_AMBULATORY_CARE_PROVIDER_SITE_OTHER): Payer: Medicare Other

## 2019-08-18 ENCOUNTER — Other Ambulatory Visit: Payer: Self-pay

## 2019-08-18 DIAGNOSIS — M7989 Other specified soft tissue disorders: Secondary | ICD-10-CM | POA: Diagnosis not present

## 2019-08-21 ENCOUNTER — Other Ambulatory Visit: Payer: Self-pay

## 2019-08-21 ENCOUNTER — Ambulatory Visit (INDEPENDENT_AMBULATORY_CARE_PROVIDER_SITE_OTHER): Payer: Medicare Other

## 2019-08-21 ENCOUNTER — Telehealth: Payer: Self-pay

## 2019-08-21 DIAGNOSIS — E538 Deficiency of other specified B group vitamins: Secondary | ICD-10-CM

## 2019-08-21 MED ORDER — CYANOCOBALAMIN 1000 MCG/ML IJ SOLN
1000.0000 ug | Freq: Once | INTRAMUSCULAR | Status: AC
  Administered 2019-08-21: 1000 ug via INTRAMUSCULAR

## 2019-08-21 NOTE — Telephone Encounter (Signed)
Copied from Edgar [phone]. Topic: Referral - Status >> Aug 21, 2019  [id number] PM Simone Curia D wrote: [id number] Follow-up call.  Left message on voicemail for patient to return my call regarding [place]. Ambrose Mantle [phone]

## 2019-08-21 NOTE — Progress Notes (Signed)
Patient came in for his B-12 injection. He tolerated it well. NKDA.   

## 2019-08-30 NOTE — Progress Notes (Deleted)
Cardiology Office Note    Date:  08/30/2019   ID:  EMMONS LATON, DOB 1961-02-08, MRN [id number]  PCP:  [name], MD  Cardiologist:  [name], MD  Electrophysiologist:  None   Chief Complaint: Follow up  History of Present Illness:   [name] is a 58 y.o. male with history of CAD s/p multiple PCIs, HFrEF secondary to ICM, DM2 with diabetic neuropathy, HTN, HLD, myasthenia gravis, hypothyroidism, GERD, DDD, and OSA on CPAP who presents for follow-up of ***.   Patient was admitted to the hospital in 2009 with an MI, having subsequently undergone a total of 10 stents between the LAD and RCA in [place]. More recently, in 03/2018, he had worsening DOE and nitrate responsive angina at rest. Echo showed an EF of 30-35%. Diagnostic cath showed a new 90% stenosis in the mid LCx with multiple patent stents in the LAD, RCA and OM2. The LCx was successfully treated with PCI/DES. In 07/2018, he was admitted with fever, confusion, vomiting and was treated for sepsis with a troponin peaking at 11.48. Echo showed persistent LV dysfunction with an EF of 30-35%. Cath was deferred in the setting of sepsis. He was readmitted in 07/2018 with recurrent fever and chills. MRI of the brain/spine did not show any source of infection. Subsequent TEE in 09/2018 did not reveal an valvular vegetation with an EF of 40-45% at that time. He was seen in 02/2019 with increasing DOE as well as more episodes of nitrate responsive exertional chest pain. Nuclear stress test was intermediate risk, showing multiple areas of apical infarct without new ischemia. In follow up, he continued to note symptoms leading his Coreg to be changed to metoprolol in an effort to improve heart rates and potentially treat his symptoms. With this, when he was seen in follow up in 03/2019, he noted an improvement in symptoms. He also reported having been seen by pulmonology and placed on a steroid taper for asthma/URI. At his visit in  03/2019, he felt his symptoms had returned to baseline, requiring 2-3 nitro/week, which was his baseline for many years. He called the office in 06/2019 with soft BP and dizziness. In this setting, Entresto was changed to losartan.  He was seen on 08/07/2019 and was doing well from a cardiac perspective with stable angina continuing to improve though did note multiple noncardiac complaints.  He indicated he had been recently diagnosed with hypothyroidism and vitamin B-12 and D deficiencies.  He also reported a gradual increase in his weight though denied any symptoms concerning for volume overload.  He reported difficulty in initiating his urinary stream and was referred to urology.  He also noted intermittent left upper extremity swelling, particularly first thing in the morning that improved as the day progressed.  His dizziness had resolved following transition from Entresto to losartan.  He was under increased stress at home.  He underwent upper extremity venous duplex on 08/18/2019 which showed no evidence of DVT.  ***   Labs: 08/2019 - potassium 4.9, BUN 26, serum creatinine 1.35, BNP 84.9 04/2019 - A1c 7.9%, albumin 4.0, AST/ALT normal, HGB 11.5, PLT 178 05/2018 - TC 109, TG 112, HDL 37, LDL 50  Past Medical History:  Diagnosis Date   Allergy    dust, seasonal (worse in the fall).   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 Followed by Pain Specialist in [place], back and neck   Asthma    BRONCHITIS   Cataract    First Dx  in 2012   Chronic combined systolic and diastolic congestive heart failure ([place])    a. 03/2018 Echo: EF 30-35%, ant, antlat, apical AK, Gr1 DD; b. 07/2018 Echo: EF 35-40%, anteroseptal, apical, and ant HK. Gr1 DD; c. 09/2019 TEE: EF 40-45%.   Coronary artery disease    a. Prior Ant MI->s/p multiple stents placed in the LAD and right coronary artery ([place]); b. 2016 Cath: reportedly nonobs dzs;  c. 04/2018 Cath/PCI: LM nl, LAD 20p, patent mid stent, LCX 63m(3.25x15 Sierra DES),  OM1 nl, OM2 50, OM3 40 w/ patent stent, RCA 40p, 90m, 40d w/ patent stent in RPDA, RPAV 60, EF 25-35%. 2+MR; d. 02/2019 MV: Apical scar, no isch, EF 30-44%.   Diabetes mellitus without complication ([place])    TYPE 2   Diabetic peripheral neuropathy (HCC)    feet and hands   FUO (fever of unknown origin) 08/03/2018   GERD (gastroesophageal reflux disease)    Headache    muscle tension   Hyperlipidemia    Hypertension    CONTROLLED ON MEDS   Insomnia    Ischemic cardiomyopathy    a. 03/2018 Echo: EF 30-35%, ant, antlat, apical AK, Gr1 DD, mild MR, mildly dil LA; b. 07/2018 Echo: EF 35-40%; c. 09/2018 TEE: EF 40-45%, antsept, ant HK.   Lyme disease    Chronic   Myasthenia gravis ([place])    Myocardial infarction ([place]) 2010   Seasonal allergies    Sepsis ([place])    a.07/2018 - unknown source. TEE neg for veg 09/2019.   Sleep apnea    CPAP    Past Surgical History:  Procedure Laterality Date   BILATERAL CARPAL TUNNEL RELEASE Bilateral L in 2012 and R in 2013   [place], most recent in  March 2016.   COLONOSCOPY WITH PROPOFOL N/A 01/10/2016   Procedure: COLONOSCOPY WITH PROPOFOL;  Surgeon: [name], MD;  Location: [facility];  Service: Endoscopy;  Laterality: N/A;   CORONARY ANGIOPLASTY     CORONARY STENT INTERVENTION N/A 04/25/2018   Procedure: CORONARY STENT INTERVENTION;  Surgeon: [name], MD;  Location: [facility];  Service: Cardiovascular;  Laterality: N/A;   ESOPHAGOGASTRODUODENOSCOPY (EGD) WITH PROPOFOL N/A 01/10/2016   Procedure: ESOPHAGOGASTRODUODENOSCOPY (EGD) WITH PROPOFOL;  Surgeon: [name], MD;  Location: [facility];  Service: Endoscopy;  Laterality: N/A;   EYE SURGERY Bilateral 2012   cataract/bilateral vitrectomies   LEFT HEART CATH AND CORONARY ANGIOGRAPHY Left 04/25/2018   Procedure: LEFT HEART CATH AND CORONARY ANGIOGRAPHY;  Surgeon: [name], MD;  Location: [facility];   Service: Cardiovascular;  Laterality: Left;   TEE WITHOUT CARDIOVERSION N/A 09/05/2018   Procedure: TRANSESOPHAGEAL ECHOCARDIOGRAM (TEE);  Surgeon: [name], MD;  Location: [facility];  Service: Cardiovascular;  Laterality: N/A;   TONSILLECTOMY AND ADENOIDECTOMY     As a child   TUNNELED VENOUS CATHETER PLACEMENT     removed    Current Medications: No outpatient medications have been marked as taking for the 09/05/19 encounter (Appointment) with Rise Mu, PA-C.    Allergies:   Azathioprine and Novolog [insulin aspart]   Social History   Socioeconomic History   Marital status: Married    Spouse name: [name]   Number of children: 0   Years of education: Not on file   Highest education level: Master's degree (e.g., MA, MS, MEng, MEd, MSW, MBA)  Occupational History   Occupation: disabled    Comment: multiple medical problems -  first approved for uncontrolled DM, but now has heart disease and Myasthenia Gravis   Social Designer, fashion/clothing strain: Not hard at all   Food insecurity    Worry: Never true    Inability: Never true   Transportation needs    Medical: No    Non-medical: No  Tobacco Use   Smoking status: Never Smoker   Smokeless tobacco: Never Used   Tobacco comment: smoking cessation materials not required  Substance and Sexual Activity   Alcohol use: Not Currently    Alcohol/week: 0.0 standard drinks   Drug use: No   Sexual activity: Not Currently    Partners: Female  Lifestyle   Physical activity    Days per week: 7 days    Minutes per session: 30 min   Stress: Only a little  Relationships   Social connections    Talks on phone: More than three times a week    Gets together: Three times a week    Attends religious service: More than 4 times per year    Active member of club or organization: Yes    Attends meetings of clubs or organizations: More than 4 times per year    Relationship status: Married  Other Topics  Concern   Not on file  Social History Narrative   Not on file     Family History:  The patient's family history includes Cancer in his father; Dementia in his father; Diabetes in his brother and mother; Healthy in his brother and brother; Heart disease in his mother.  ROS:   ROS   EKGs/Labs/Other Studies Reviewed:    Studies reviewed were summarized above. The additional studies were reviewed today: As above.   EKG:  EKG is ordered today.  The EKG ordered today demonstrates ***  Recent Labs: 02/03/2019: Hemoglobin 11.5; Platelets 178 05/05/2019: ALT 14 08/07/2019: BNP 84.9; BUN 26; Creatinine, Ser 1.35; Potassium 4.9; Sodium 140  Recent Lipid Panel    Component Value Date/Time   CHOL 109 05/18/2018   CHOL 113 01/17/2018 0819   TRIG 112 05/18/2018   HDL 37 05/18/2018   HDL 33 (L) 01/17/2018 0819   CHOLHDL 3.4 01/17/2018 0819   CHOLHDL 3.7 11/29/2017 1154   VLDL 26 11/29/2017 1154   LDLCALC 50 05/18/2018   LDLCALC 62 01/17/2018 0819    PHYSICAL EXAM:    VS:  There were no vitals taken for this visit.  BMI: There is no height or weight on file to calculate BMI.  Physical Exam  Wt Readings from Last 3 Encounters:  08/16/19 240 lb (108.9 kg)  08/07/19 250 lb 4 oz (113.5 kg)  07/31/19 236 lb (107 kg)     ASSESSMENT & PLAN:   1. ***  Disposition: F/u with Dr. Fletcher Anon or an APP in ***.   Medication Adjustments/Labs and Tests Ordered: Current medicines are reviewed at length with the patient today.  Concerns regarding medicines are outlined above. Medication changes, Labs and Tests ordered today are summarized above and listed in the Patient Instructions accessible in Encounters.   Signed, Christell Faith, PA-C 08/30/2019 8:57 AM     [facility] [address] [place] [address] [place], [place] [address] [phone]

## 2019-09-05 ENCOUNTER — Ambulatory Visit: Payer: Medicare Other | Admitting: Physician Assistant

## 2019-09-07 ENCOUNTER — Other Ambulatory Visit: Payer: Self-pay | Admitting: Family Medicine

## 2019-09-13 ENCOUNTER — Ambulatory Visit: Payer: Medicare Other | Admitting: Anesthesiology

## 2019-09-13 DIAGNOSIS — Z794 Long term (current) use of insulin: Secondary | ICD-10-CM | POA: Diagnosis not present

## 2019-09-13 DIAGNOSIS — E1165 Type 2 diabetes mellitus with hyperglycemia: Secondary | ICD-10-CM | POA: Diagnosis not present

## 2019-09-14 ENCOUNTER — Telehealth (INDEPENDENT_AMBULATORY_CARE_PROVIDER_SITE_OTHER): Payer: Medicare Other | Admitting: Cardiovascular Disease

## 2019-09-14 ENCOUNTER — Other Ambulatory Visit: Payer: Self-pay

## 2019-09-14 ENCOUNTER — Other Ambulatory Visit: Payer: Self-pay | Admitting: Family Medicine

## 2019-09-14 VITALS — BP 125/75 | HR 92 | Ht 67.0 in | Wt 235.0 lb

## 2019-09-14 DIAGNOSIS — I25118 Atherosclerotic heart disease of native coronary artery with other forms of angina pectoris: Secondary | ICD-10-CM

## 2019-09-14 DIAGNOSIS — F331 Major depressive disorder, recurrent, moderate: Secondary | ICD-10-CM

## 2019-09-14 DIAGNOSIS — M5412 Radiculopathy, cervical region: Secondary | ICD-10-CM | POA: Diagnosis not present

## 2019-09-14 DIAGNOSIS — I255 Ischemic cardiomyopathy: Secondary | ICD-10-CM | POA: Diagnosis not present

## 2019-09-14 DIAGNOSIS — G7000 Myasthenia gravis without (acute) exacerbation: Secondary | ICD-10-CM | POA: Diagnosis not present

## 2019-09-14 DIAGNOSIS — E785 Hyperlipidemia, unspecified: Secondary | ICD-10-CM | POA: Diagnosis not present

## 2019-09-14 DIAGNOSIS — I5022 Chronic systolic (congestive) heart failure: Secondary | ICD-10-CM | POA: Diagnosis not present

## 2019-09-14 DIAGNOSIS — J4541 Moderate persistent asthma with (acute) exacerbation: Secondary | ICD-10-CM

## 2019-09-14 DIAGNOSIS — E119 Type 2 diabetes mellitus without complications: Secondary | ICD-10-CM

## 2019-09-14 DIAGNOSIS — F411 Generalized anxiety disorder: Secondary | ICD-10-CM

## 2019-09-14 DIAGNOSIS — I110 Hypertensive heart disease with heart failure: Secondary | ICD-10-CM

## 2019-09-14 DIAGNOSIS — I10 Essential (primary) hypertension: Secondary | ICD-10-CM

## 2019-09-14 DIAGNOSIS — M5416 Radiculopathy, lumbar region: Secondary | ICD-10-CM | POA: Diagnosis not present

## 2019-09-14 DIAGNOSIS — G629 Polyneuropathy, unspecified: Secondary | ICD-10-CM | POA: Diagnosis not present

## 2019-09-14 MED ORDER — BREO ELLIPTA 200-25 MCG/INH IN AEPB: 1.0000 | INHALATION_SPRAY | Freq: Every day | RESPIRATORY_TRACT | 1 refills | Status: DC

## 2019-09-14 NOTE — Patient Instructions (Signed)
Medication Instructions:  Continue same medications *If you need a refill on your cardiac medications before your next appointment, please call your pharmacy*  Lab Work: None If you have labs (blood work) drawn today and your tests are completely normal, you will receive your results only by: . MyChart Message (if you have MyChart) OR . A paper copy in the mail If you have any lab test that is abnormal or we need to change your treatment, we will call you to review the results.  Testing/Procedures: None  Follow-Up: At [facility], you and your health needs are our priority.  As part of our continuing mission to provide you with exceptional heart care, we have created designated Provider Care Teams.  These Care Teams include your primary Cardiologist (physician) and Advanced Practice Providers (APPs -  Physician Assistants and Nurse Practitioners) who all work together to provide you with the care you need, when you need it.  Your next appointment:   4 months  The format for your next appointment:   In Person  Provider:    You may see Muhammad Arida, MD or one of the following Advanced Practice Providers on your designated Care Team:    Christopher Berge, NP  Ryan Dunn, PA-C  Jacquelyn Visser, PA-C   

## 2019-09-14 NOTE — Progress Notes (Signed)
Virtual Visit via phone Note   This visit type was conducted due to national recommendations for restrictions regarding the COVID-19 Pandemic (e.g. social distancing) in an effort to limit this patient's exposure and mitigate transmission in our community.  Due to his co-morbid illnesses, this patient is at least at moderate risk for complications without adequate follow up.  This format is felt to be most appropriate for this patient at this time.  All issues noted in this document were discussed and addressed.  A limited physical exam was performed with this format.  Please refer to the patient's chart for his consent to telehealth for [facility].   Date:  09/14/2019   ID:  [name], DOB [birth year], MRN [id number]  Patient Location: Home Provider Location: Office  PCP:  [name], MD  Cardiologist:  [name], MD  Electrophysiologist:  None   Evaluation Performed:  Follow-Up Visit  Chief Complaint: Doing well overall  History of Present Illness:    [name] is a 58 y.o. male who was reached via phone for follow-up visit regarding coronary artery disease and chronic systolic heart failure.    He has extensive cardiac history. He had a total of 10 stents placed (in LAD and RCA) starting in 2009 after he presented with myocardial infarction.  He has other chronic medical conditions that include diabetes, hypertension, hyperlipidemia,myasthenia gravis,  generalized pain due to reported Lyme disease and obesity. He has history of hyperlipidemia with intolerance to high-dose statins.  He had worsening heart failure in June of 2019.  Echocardiogram showed worsening LV systolic function with an EF of 30% with akinesis of the anterior, anterolateral and apical myocardium.  There was mild mitral regurgitation. Cardiac catheterization in July, 2019 showed patent stents in the LAD, OM's and right coronary artery.  There was a new mid left circumflex stenosis which was  stented.  EF was 25 to 30%.  LVEDP was severely elevated.  The patient was started on furosemide.  Entresto was added. He had recurrent sepsis and fever of unknown origin starting in October 2019 with elevated troponin.  Repeat catheterization was not performed.  TEE was performed which showed no evidence of endocarditis.  EF was 40 to 45%.  He ultimately did not tolerate Entresto due to hypotension and dizziness and was switched back to losartan.  In addition, he had persistent tachycardia and dizziness with carvedilol and we elected to switch him to metoprolol.  Since then, he reports significant improvement in symptoms and he is currently doing well with no chest pain.  He reports stable exertional dyspnea.  He uses furosemide 20 mg every other day and he has been paying more attention to healthy diet.  His weight continues to go down.  The patient does not have symptoms concerning for COVID-19 infection (fever, chills, cough, or new shortness of breath).    Past Medical History:  Diagnosis Date  . Allergy    dust, seasonal (worse in the fall).  . 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 Followed by Pain Specialist in [place], back and neck  . Asthma    BRONCHITIS  . Cataract    First Dx in 2012  . Chronic combined systolic and diastolic congestive heart failure ([place])    a. 03/2018 Echo: EF 30-35%, ant, antlat, apical AK, Gr1 DD; b. 07/2018 Echo: EF 35-40%, anteroseptal, apical, and ant HK. Gr1 DD; c. 09/2019 TEE: EF 40-45%.  . Coronary artery disease    a. Prior [employer]  MI->s/p multiple stents placed in the LAD and right coronary artery ([place]); b. 2016 Cath: reportedly nonobs dzs;  c. 04/2018 Cath/PCI: LM nl, LAD 20p, patent mid stent, LCX 24m3.25x15 Sierra DES), OM1 nl, OM2 50, OM3 40 w/ patent stent, RCA 40p, 227m40d w/ patent stent in RPDA, RPAV 60, EF 25-35%. 2+MR; d. 02/2019 MV: Apical scar, no isch, EF 30-44%.  . Diabetes mellitus without complication ([place]   TYPE 2  . Diabetic peripheral  neuropathy (HCC)    feet and hands  . FUO (fever of unknown origin) 08/03/2018  . GERD (gastroesophageal reflux disease)   . Headache    muscle tension  . Hyperlipidemia   . Hypertension    CONTROLLED ON MEDS  . Insomnia   . Ischemic cardiomyopathy    a. 03/2018 Echo: EF 30-35%, ant, antlat, apical AK, Gr1 DD, mild MR, mildly dil LA; b. 07/2018 Echo: EF 35-40%; c. 09/2018 TEE: EF 40-45%, antsept, ant HK.  . Lyme disease    Chronic  . My[place]ia gravis ([place]  . [place]ocardial infarction ([facility]  . Seasonal allergies   . Sepsis ([place]   a.07/2018 - unknown source. TEE neg for veg 09/2019.  . Marland Kitchenleep apnea    CPAP   Past Surgical History:  Procedure Laterality Date  . BILATERAL CARPAL TUNNEL RELEASE Bilateral L in 2012 and R in 2013  . CARDIAC CATHETERIZATION     Several Caths, most recent in  March 2016.  . Marland KitchenOLONOSCOPY WITH PROPOFOL N/A 01/10/2016   Procedure: COLONOSCOPY WITH PROPOFOL;  Surgeon: DaLucilla LameMD;  Location: [facility];  Service: Endoscopy;  Laterality: N/A;  . CORONARY ANGIOPLASTY    . CORONARY STENT INTERVENTION N/A 04/25/2018   Procedure: CORONARY STENT INTERVENTION;  Surgeon: ArWellington HampshireMD;  [facility];  Service: Cardiovascular;  Laterality: N/A;  . ESOPHAGOGASTRODUODENOSCOPY (EGD) WITH PROPOFOL N/A 01/10/2016   Procedure: ESOPHAGOGASTRODUODENOSCOPY (EGD) WITH PROPOFOL;  Surgeon: DaLucilla LameMD;  Location: [facility];  Service: Endoscopy;  Laterality: N/A;  . EYE SURGERY Bilateral 2012   cataract/bilateral vitrectomies  . LEFT HEART CATH AND CORONARY ANGIOGRAPHY Left 04/25/2018   Procedure: LEFT HEART CATH AND CORONARY ANGIOGRAPHY;  Surgeon: ArWellington HampshireMD;  [facility];  Service: Cardiovascular;  Laterality: Left;  . TEE WITHOUT CARDIOVERSION N/A 09/05/2018   Procedure: TRANSESOPHAGEAL ECHOCARDIOGRAM (TEE);  Surgeon: ArWellington HampshireMD;  Location: [facility];  Service: Cardiovascular;  Laterality: N/A;  .  TONSILLECTOMY AND ADENOIDECTOMY     As a child  . TUNNELED VENOUS CATHETER PLACEMENT     removed     Current Meds  Medication Sig  . albuterol (PROVENTIL HFA;VENTOLIN HFA) 108 (90 Base) MCG/ACT inhaler Inhale 2 puffs into the lungs every 6 (six) hours as needed for wheezing or shortness of breath.  . Marland Kitchenlbuterol (PROVENTIL) (2.5 MG/3ML) 0.083% nebulizer solution Take 2.5 mg by nebulization every 6 (six) hours as needed for wheezing or shortness of breath.  . Marland Kitchenspirin EC 81 MG tablet Take 1 tablet (81 mg total) by mouth daily.  . betamethasone valerate ointment (VALISONE) 0.1 % Apply topically.  . cholecalciferol (VITAMIN D3) 25 MCG (1000 UT) tablet Take 2,000 Units by mouth daily.  . clopidogrel (PLAVIX) 75 MG tablet Take 1 tablet (75 mg total) by mouth daily with breakfast. (Patient taking differently: Take 75 mg by mouth daily with breakfast. Pt stop taking 10 day ago for procedure)  . Cyanocobalamin (B-12 COMPLIANCE INJECTION) 1000 MCG/ML  KIT Inject 1,000 mcg as directed every 30 (thirty) days.  . cyclobenzaprine (FLEXERIL) 10 MG tablet Take 1 tablet by mouth 2 (two) times daily.  [name] DEXILANT 60 MG capsule TAKE ONE CAPSULE BY MOUTH DAILY  . DULoxetine (CYMBALTA) 30 MG capsule TAKE 1 CAPSULE BY MOUTH DAILY FOR 1 WEEK THEN 2 CAPSULES DAILY  . fluticasone furoate-vilanterol (BREO ELLIPTA) 200-25 MCG/INH AEPB Inhale 1 puff into the lungs daily.  . furosemide (LASIX) 20 MG tablet Take 20 mg by mouth every other day.  . gabapentin (NEURONTIN) 300 MG capsule Take 1 capsule (300 mg total) by mouth 3 (three) times daily.  [name] gabapentin (NEURONTIN) 800 MG tablet Take 800 mg by mouth at bedtime.  . insulin lispro (HUMALOG KWIKPEN) 100 UNIT/ML KiwkPen Inject 7-8 Units into the skin 3 (three) times daily. Reported on 12/02/2015/ sliding scale 1 unit for every 8 units of carbs; and 1 unit for every 20 above 120  . Insulin Pen Needle (FIFTY50 PEN NEEDLES) 32G X 4 MM MISC Inject 1 each into the skin as needed.   [name] LANTUS SOLOSTAR 100 UNIT/ML Solostar Pen Inject 25-30 Units into the skin daily at 10 pm.   . levocetirizine (XYZAL) 5 MG tablet TAKE 1 TABLET(5 MG) BY MOUTH EVERY EVENING  . levothyroxine (SYNTHROID) 25 MCG tablet TAKE 1 TABLET(25 MCG) BY MOUTH DAILY BEFORE BREAKFAST  . liraglutide (VICTOZA) 18 MG/3ML SOPN 1.8 units every morning (Patient taking differently: Inject 1.8 mg into the skin daily. )  . loratadine (CLARITIN) 10 MG tablet Take 10 mg by mouth daily.   . metFORMIN (GLUCOPHAGE) 1000 MG tablet Take 1,000 mg by mouth 2 (two) times daily with a meal.  . metoprolol tartrate (LOPRESSOR) 50 MG tablet Take 1 tablet by mouth 2 (two) times daily.  . montelukast (SINGULAIR) 10 MG tablet Take 10 mg by mouth at bedtime.  . Multiple Vitamins-Minerals (MENS MULTI VITAMIN & MINERAL PO) Take 1 tablet by mouth daily.  . naloxone (NARCAN) nasal spray 4 mg/0.1 mL For excess sedation from opioids  . nitroGLYCERIN (NITROSTAT) 0.3 MG SL tablet Place 1 tablet (0.3 mg total) under the tongue every 5 (five) minutes as needed for chest pain. Maximum 3 doses.  . Oxycodone HCl 10 MG TABS Take 1 tablet (10 mg total) by mouth 4 (four) times daily.  Derrill Memo ON 09/17/2019] Oxycodone HCl 10 MG TABS Take 1 tablet (10 mg total) by mouth 4 (four) times daily.  [name] pyridostigmine (MESTINON) 60 MG tablet Take 1 tablet by mouth 3 (three) times daily.  . rosuvastatin (CRESTOR) 5 MG tablet Take 5 mg by mouth daily.  . tamsulosin (FLOMAX) 0.4 MG CAPS capsule TAKE 1 CAPSULE(0.4 MG) BY MOUTH DAILY     Allergies:   Azathioprine and Novolog [insulin aspart]   Social History   Tobacco Use  . Smoking status: Never Smoker  . Smokeless tobacco: Never Used  . Tobacco comment: smoking cessation materials not required  Substance Use Topics  . Alcohol use: Not Currently    Alcohol/week: 0.0 standard drinks  . Drug use: No     Family Hx: The patient's family history includes Cancer in his father; Dementia in his father;  Diabetes in his brother and mother; Healthy in his brother and brother; Heart disease in his mother.  ROS:   Please see the history of present illness.     All other systems reviewed and are negative.   Prior CV studies:   The following studies were reviewed today:  Labs/Other Tests and Data Reviewed:    EKG:  No ECG reviewed.  Recent Labs: 02/03/2019: Hemoglobin 11.5; Platelets 178 05/05/2019: ALT 14 08/07/2019: BNP 84.9; BUN 26; Creatinine, Ser 1.35; Potassium 4.9; Sodium 140   Recent Lipid Panel Lab Results  Component Value Date/Time   CHOL 109 05/18/2018 12:00 AM   CHOL 113 01/17/2018 08:19 AM   TRIG 112 05/18/2018 12:00 AM   HDL 37 05/18/2018 12:00 AM   HDL 33 (L) 01/17/2018 08:19 AM   CHOLHDL 3.4 01/17/2018 08:19 AM   CHOLHDL 3.7 11/29/2017 11:54 AM   LDLCALC 50 05/18/2018 12:00 AM   LDLCALC 62 01/17/2018 08:19 AM    Wt Readings from Last 3 Encounters:  09/14/19 235 lb (106.6 kg)  08/16/19 240 lb (108.9 kg)  08/07/19 250 lb 4 oz (113.5 kg)     Objective:    Vital Signs:  BP 125/75 (BP Location: Left Arm, Patient Position: Sitting, Cuff Size: Normal)   Pulse 92   Ht '5\' 7"'$  (1.702 m)   Wt 235 lb (106.6 kg)   BMI 36.81 kg/m    VITAL SIGNS:  reviewed   ASSESSMENT & PLAN:    1. Coronary artery disease with stable angina: He is doing well overall with controlled symptoms.  His dizziness resolved after switching Entresto to losartan and carvedilol to metoprolol.   2.  Chronic systolic heart failure due to ischemic cardiomyopathy: Most recent ejection fraction was 40 to 45%.  Continue metoprolol and losartan.  He appears to be euvolemic on small dose furosemide every other day.   3.  Essential hypertension: As above, if anything, his blood pressure runs soft.  Reducing carvedilol.  4.  Hyperlipidemia: Tolerating rosuvastatin and Zetia.  LDL 50 in August 2019.  5.  Type 2 diabetes mellitus: A1c 7.8 and she followed closely by primary care.Consider SGLT2  inhibitor in the future.   COVID-19 Education: The signs and symptoms of COVID-19 were discussed with the patient and how to seek care for testing (follow up with PCP or arrange E-visit).  The importance of social distancing was discussed today.  Time:   Today, I have spent 10 minutes with the patient with telehealth technology discussing the above problems.     Medication Adjustments/Labs and Tests Ordered: Current medicines are reviewed at length with the patient today.  Concerns regarding medicines are outlined above.   Tests Ordered: No orders of the defined types were placed in this encounter.   Medication Changes: No orders of the defined types were placed in this encounter.   Disposition: In person follow-up visit in 4 months  Signed, [name], MD  09/14/2019 8:45 AM    [place]

## 2019-09-15 ENCOUNTER — Encounter: Payer: Self-pay | Admitting: Urology

## 2019-09-15 ENCOUNTER — Ambulatory Visit: Payer: Medicare Other | Admitting: Urology

## 2019-09-15 ENCOUNTER — Other Ambulatory Visit: Payer: Self-pay

## 2019-09-15 VITALS — BP 141/81 | HR 98 | Ht 67.0 in | Wt 235.0 lb

## 2019-09-15 DIAGNOSIS — R339 Retention of urine, unspecified: Secondary | ICD-10-CM

## 2019-09-15 DIAGNOSIS — R399 Unspecified symptoms and signs involving the genitourinary system: Secondary | ICD-10-CM

## 2019-09-15 DIAGNOSIS — N398 Other specified disorders of urinary system: Secondary | ICD-10-CM | POA: Diagnosis not present

## 2019-09-15 LAB — MICROSCOPIC EXAMINATION
Bacteria, UA: NONE SEEN
Epithelial Cells (non renal): NONE SEEN /hpf (ref 0–10)
RBC, Urine: NONE SEEN /hpf (ref 0–2)
WBC, UA: NONE SEEN /hpf (ref 0–5)

## 2019-09-15 LAB — URINALYSIS, COMPLETE
Bilirubin, UA: NEGATIVE
Glucose, UA: NEGATIVE
Ketones, UA: NEGATIVE
Leukocytes,UA: NEGATIVE
Nitrite, UA: NEGATIVE
Protein,UA: NEGATIVE
RBC, UA: NEGATIVE
Specific Gravity, UA: 1.01 (ref 1.005–1.030)
Urobilinogen, Ur: 0.2 mg/dL (ref 0.2–1.0)
pH, UA: 5.5 (ref 5.0–7.5)

## 2019-09-15 LAB — BLADDER SCAN AMB NON-IMAGING

## 2019-09-15 MED ORDER — TAMSULOSIN HCL 0.4 MG PO CAPS: ORAL_CAPSULE | ORAL | 0 refills | Status: DC

## 2019-09-15 NOTE — Progress Notes (Signed)
09/15/2019 2:01 PM   Christia Reading Otis Peak Mar 17, 1961 [id number]  Referring provider: [name], MD [address] [place],  [place] [address]  Chief Complaint  Patient presents with  . Other    HPI: [name] is a 58 y.o. male seen in consultation at the request of Dr. Ancil Boozer for evaluation of lower urinary tract symptoms.  He presents with a 62-monthhistory of obstructive voiding symptoms which are much worse at night.  He typically gets up once per night to void however complains of urinary hesitancy, straining to urinate, intermittent urinary stream with a prolonged voiding time.  He was started on tamsulosin 0.4 mg with improvement in his symptoms.  Denies dysuria or gross hematuria.  He denies frequency, urgency or urinary incontinence.  He does have lumbar radiculopathy which is chronic.  IPSS completed today was 26/35 with a quality of life rated 3/6.  Prior urologic history remarkable for uric acid stone in his early 267swithout recurrence.   PMH: Past Medical History:  Diagnosis Date  . Allergy    dust, seasonal (worse in the fall).  . 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 Followed by Pain Specialist in [place], back and neck  . Asthma    BRONCHITIS  . Cataract    First Dx in 2012  . Chronic combined systolic and diastolic congestive heart failure ([place]    a. 03/2018 Echo: EF 30-35%, ant, antlat, apical AK, Gr1 DD; b. 07/2018 Echo: EF 35-40%, anteroseptal, apical, and ant HK. Gr1 DD; c. 09/2019 TEE: EF 40-45%.  . Coronary artery disease    a. Prior Ant MI->s/p multiple stents placed in the LAD and right coronary artery ([place]; b. 2016 Cath: reportedly nonobs dzs;  c. 04/2018 Cath/PCI: LM nl, LAD 20p, patent mid stent, LCX 959m.[id number] [place] DES), OM1 nl, OM2 50, OM3 40 w/ patent stent, RCA 40p, 205m0d w/ patent stent in RPDA, RPAV 60, EF 25-35%. 2+MR; d. 02/2019 MV: Apical scar, no isch, EF 30-44%.  . Diabetes mellitus without complication ([place]  TYPE 2  .  Diabetic peripheral neuropathy (HCC)    feet and hands  . FUO (fever of unknown origin) 08/03/2018  . GERD (gastroesophageal reflux disease)   . Headache    muscle tension  . Hyperlipidemia   . Hypertension    CONTROLLED ON MEDS  . Insomnia   . Ischemic cardiomyopathy    a. 03/2018 Echo: EF 30-35%, ant, antlat, apical AK, Gr1 DD, mild MR, mildly dil LA; b. 07/2018 Echo: EF 35-40%; c. 09/2018 TEE: EF 40-45%, antsept, ant HK.  . Lyme disease    Chronic  . Myasthenia gravis ([place] . Myocardial infarction ([place]  . Seasonal allergies   . Sepsis ([place]  a.07/2018 - unknown source. TEE neg for veg 09/2019.  . SMarland Kitcheneep apnea    CPAP    Surgical History: Past Surgical History:  Procedure Laterality Date  . BILATERAL CARPAL TUNNEL RELEASE Bilateral L in 2012 and R in 2013  . CARDIAC CATHETERIZATION     Several Caths, most recent in  March 2016.  . CMarland KitchenLONOSCOPY WITH PROPOFOL N/A 01/10/2016   Procedure: COLONOSCOPY WITH PROPOFOL;  Surgeon: DarLucilla LameD;  Location: [facility];  Service: Endoscopy;  Laterality: N/A;  . CORONARY ANGIOPLASTY    . CORONARY STENT INTERVENTION N/A 04/25/2018   Procedure: CORONARY STENT INTERVENTION;  Surgeon: AriWellington HampshireD;  [facility];  Service: Cardiovascular;  Laterality:  N/A;  . ESOPHAGOGASTRODUODENOSCOPY (EGD) WITH PROPOFOL N/A 01/10/2016   Procedure: ESOPHAGOGASTRODUODENOSCOPY (EGD) WITH PROPOFOL;  Surgeon: [name], MD;  Location: [facility] ENDOSCOPY;  Service: Endoscopy;  Laterality: N/A;  . EYE SURGERY Bilateral 2012   cataract/bilateral vitrectomies  . LEFT HEART CATH AND CORONARY ANGIOGRAPHY Left 04/25/2018   Procedure: LEFT HEART CATH AND CORONARY ANGIOGRAPHY;  Surgeon: [name], MD;  Location: [facility];  Service: Cardiovascular;  Laterality: Left;  . TEE WITHOUT CARDIOVERSION N/A 09/05/2018   Procedure: TRANSESOPHAGEAL ECHOCARDIOGRAM (TEE);  Surgeon: [name], MD;  Location: [facility];   Service: Cardiovascular;  Laterality: N/A;  . TONSILLECTOMY AND ADENOIDECTOMY     As a child  . TUNNELED VENOUS CATHETER PLACEMENT     removed    Home Medications:  Allergies as of 09/15/2019      Reactions   Azathioprine Other (See Comments)   Azathioprine hypersensitivity reaction   Novolog [insulin Aspart] Hives      Medication List       Accurate as of September 15, 2019  2:01 PM. If you have any questions, ask your nurse or doctor.        albuterol (2.5 MG/3ML) 0.083% nebulizer solution Commonly known as: PROVENTIL Take 2.5 mg by nebulization every 6 (six) hours as needed for wheezing or shortness of breath.   albuterol 108 (90 Base) MCG/ACT inhaler Commonly known as: VENTOLIN HFA Inhale 2 puffs into the lungs every 6 (six) hours as needed for wheezing or shortness of breath.   aspirin EC 81 MG tablet Take 1 tablet (81 mg total) by mouth daily.   B-12 Compliance Injection 1000 MCG/ML Kit Generic drug: Cyanocobalamin Inject 1,000 mcg as directed every 30 (thirty) days.   betamethasone valerate ointment 0.1 % Commonly known as: VALISONE Apply topically.   Breo Ellipta 200-25 MCG/INH Aepb Generic drug: fluticasone furoate-vilanterol Inhale 1 puff into the lungs daily.   cholecalciferol 25 MCG (1000 UT) tablet Commonly known as: VITAMIN D3 Take 2,000 Units by mouth daily.   Claritin 10 MG tablet Generic drug: loratadine Take 10 mg by mouth daily.   clopidogrel 75 MG tablet Commonly known as: PLAVIX Take 1 tablet (75 mg total) by mouth daily with breakfast. What changed: additional instructions   cyclobenzaprine 10 MG tablet Commonly known as: FLEXERIL Take 1 tablet by mouth 2 (two) times daily.   Dexilant 60 MG capsule Generic drug: dexlansoprazole TAKE ONE CAPSULE BY MOUTH DAILY   DULoxetine 30 MG capsule Commonly known as: CYMBALTA TAKE 1 CAPSULE BY MOUTH DAILY FOR 1 WEEK THEN 2 CAPSULES DAILY   ezetimibe 10 MG tablet Commonly known as:  ZETIA Take 1 tablet (10 mg total) by mouth daily.   Fifty50 Pen Needles 32G X 4 MM Misc Generic drug: Insulin Pen Needle Inject 1 each into the skin as needed.   furosemide 20 MG tablet Commonly known as: LASIX Take 20 mg by mouth every other day.   gabapentin 800 MG tablet Commonly known as: NEURONTIN Take 800 mg by mouth at bedtime.   gabapentin 300 MG capsule Commonly known as: NEURONTIN Take 1 capsule (300 mg total) by mouth 3 (three) times daily.   HumaLOG KwikPen 100 UNIT/ML KiwkPen Generic drug: insulin lispro Inject 7-8 Units into the skin 3 (three) times daily. Reported on 12/02/2015/ sliding scale 1 unit for every 8 units of carbs; and 1 unit for every 20 above 120   Lantus SoloStar 100 UNIT/ML Solostar Pen Generic drug: Insulin Glargine Inject 25-30  Units into the skin daily at 10 pm.   levocetirizine 5 MG tablet Commonly known as: XYZAL TAKE 1 TABLET(5 MG) BY MOUTH EVERY EVENING   levothyroxine 25 MCG tablet Commonly known as: SYNTHROID TAKE 1 TABLET(25 MCG) BY MOUTH DAILY BEFORE BREAKFAST   liraglutide 18 MG/3ML Sopn Commonly known as: Victoza 1.8 units every morning What changed:   how much to take  how to take this  when to take this  additional instructions   losartan 25 MG tablet Commonly known as: COZAAR Take 1 tablet (25 mg total) by mouth daily.   MENS MULTI VITAMIN & MINERAL PO Take 1 tablet by mouth daily.   metFORMIN 1000 MG tablet Commonly known as: GLUCOPHAGE Take 1,000 mg by mouth 2 (two) times daily with a meal.   metoprolol tartrate 50 MG tablet Commonly known as: LOPRESSOR Take 1 tablet by mouth 2 (two) times daily.   montelukast 10 MG tablet Commonly known as: SINGULAIR Take 10 mg by mouth at bedtime.   naloxone 4 MG/0.1ML Liqd nasal spray kit Commonly known as: NARCAN For excess sedation from opioids   nitroGLYCERIN 0.3 MG SL tablet Commonly known as: NITROSTAT Place 1 tablet (0.3 mg total) under the tongue every  5 (five) minutes as needed for chest pain. Maximum 3 doses.   Oxycodone HCl 10 MG Tabs Take 1 tablet (10 mg total) by mouth 4 (four) times daily.   Oxycodone HCl 10 MG Tabs Take 1 tablet (10 mg total) by mouth 4 (four) times daily. Start taking on: September 17, 2019   pyridostigmine 60 MG tablet Commonly known as: MESTINON Take 1 tablet by mouth 3 (three) times daily.   rosuvastatin 5 MG tablet Commonly known as: CRESTOR Take 5 mg by mouth daily.   tamsulosin 0.4 MG Caps capsule Commonly known as: FLOMAX TAKE 1 CAPSULE(0.4 MG) BY MOUTH DAILY       Allergies:  Allergies  Allergen Reactions  . Azathioprine Other (See Comments)    Azathioprine hypersensitivity reaction  . Novolog [Insulin Aspart] Hives    Family History: Family History  Problem Relation Age of Onset  . Diabetes Mother   . Heart disease Mother   . Cancer Father        Prostate CA, Anal cancer   . Dementia Father   . Diabetes Brother   . Healthy Brother   . Healthy Brother     Social History:  reports that he has never smoked. He has never used smokeless tobacco. He reports previous alcohol use. He reports that he does not use drugs.  ROS: UROLOGY Frequent Urination?: No Hard to postpone urination?: No Burning/pain with urination?: Yes Get up at night to urinate?: No Leakage of urine?: No Urine stream starts and stops?: Yes Trouble starting stream?: Yes Do you have to strain to urinate?: Yes Blood in urine?: No Urinary tract infection?: No Sexually transmitted disease?: No Injury to kidneys or bladder?: No Painful intercourse?: No Weak stream?: No Erection problems?: No Penile pain?: No  Gastrointestinal Nausea?: No Vomiting?: No Indigestion/heartburn?: Yes Diarrhea?: No Constipation?: No  Constitutional Fever: No Night sweats?: No Weight loss?: No Fatigue?: Yes  Skin Skin rash/lesions?: Yes Itching?: No  Eyes Blurred vision?: No Double vision?:  Yes  Ears/Nose/Throat Sore throat?: No Sinus problems?: No  Hematologic/Lymphatic Swollen glands?: No Easy bruising?: No  Cardiovascular Leg swelling?: No Chest pain?: Yes  Respiratory Cough?: No Shortness of breath?: Yes  Endocrine Excessive thirst?: No  Musculoskeletal Back pain?: Yes Joint pain?: Yes  Neurological Headaches?: No Dizziness?: Yes  Psychologic Depression?: Yes Anxiety?: No  Physical Exam: BP (!) 141/81   Pulse 98   Ht '5\' 7"'$  (1.702 m)   Wt 235 lb (106.6 kg)   BMI 36.81 kg/m   Constitutional:  Alert and oriented, No acute distress. HEENT: [place] AT, moist mucus membranes.  Trachea midline, no masses. Cardiovascular: No clubbing, cyanosis, or edema. Respiratory: Normal respiratory effort, no increased work of breathing. GI: Abdomen is soft, nontender, nondistended, no abdominal masses GU: Prostate flat, smooth without nodules or induration.  40 cc. Skin: No rashes, bruises or suspicious lesions. Neurologic: Grossly intact, no focal deficits, moving all 4 extremities. Psychiatric: Normal mood and affect.   Assessment & Plan:    - Lower urinary tract symptoms with incomplete bladder emptying We discussed the most common etiology of BPH with outlet obstruction.  The possibility of neurogenic detrusor hypotonicity was also discussed.  He did note some benefit with tamsulosin and will increase to 0.8 mg.  PVR by bladder scan today was 270 mL.  Follow-up in approximately 4 weeks for symptom reassessment and bladder scan for PVR.  If no improvement will perform cystoscopy at that visit.   Abbie Sons, [place] [address], [place], [place] [address] [phone]

## 2019-10-04 ENCOUNTER — Ambulatory Visit: Payer: Medicare Other | Attending: Anesthesiology | Admitting: Anesthesiology

## 2019-10-04 ENCOUNTER — Encounter: Payer: Self-pay | Admitting: Anesthesiology

## 2019-10-04 ENCOUNTER — Other Ambulatory Visit: Payer: Self-pay

## 2019-10-04 DIAGNOSIS — M47817 Spondylosis without myelopathy or radiculopathy, lumbosacral region: Secondary | ICD-10-CM

## 2019-10-04 DIAGNOSIS — A6923 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: Secondary | ICD-10-CM

## 2019-10-04 DIAGNOSIS — M5432 Sciatica, left side: Secondary | ICD-10-CM

## 2019-10-04 DIAGNOSIS — F1190 Opioid use, unspecified, uncomplicated: Secondary | ICD-10-CM | POA: Diagnosis not present

## 2019-10-04 DIAGNOSIS — R29898 Other symptoms and signs involving the musculoskeletal system: Secondary | ICD-10-CM

## 2019-10-04 DIAGNOSIS — M4716 Other spondylosis with myelopathy, lumbar region: Secondary | ICD-10-CM

## 2019-10-04 DIAGNOSIS — G894 Chronic pain syndrome: Secondary | ICD-10-CM | POA: Diagnosis not present

## 2019-10-04 DIAGNOSIS — M5136 Other intervertebral disc degeneration, lumbar region: Secondary | ICD-10-CM | POA: Diagnosis not present

## 2019-10-04 DIAGNOSIS — M542 Cervicalgia: Secondary | ICD-10-CM

## 2019-10-04 HISTORY — DX: Other symptoms and signs involving the musculoskeletal system: R29.898

## 2019-10-04 MED ORDER — OXYCODONE HCL 10 MG PO TABS: 10.0000 mg | ORAL_TABLET | Freq: Four times a day (QID) | ORAL | 0 refills | Status: DC

## 2019-10-04 MED ORDER — OXYCODONE HCL 10 MG PO TABS: 10.0000 mg | ORAL_TABLET | Freq: Four times a day (QID) | ORAL | 0 refills | Status: AC

## 2019-10-04 NOTE — Progress Notes (Signed)
Virtual Visit via Video Note  I connected with [name] on 10/04/19 at 10:00 AM EST by a video enabled telemedicine application and verified that I am speaking with the correct person using two identifiers.  Location: Patient: Home Provider: Pain control center   I discussed the limitations of evaluation and management by telemedicine and the availability of in person appointments. The patient expressed understanding and agreed to proceed.  History of Present Illness: I spoke with [name] via video voice conferencing for his virtual visit.  He states that he is done very well with his last epidural but he is having some recurrence of the same pain.  He reported to 75 to 80% relief lasting approximately 6 weeks.  He has had chronic lower extremity weakness and is having some left upper extremity weakness as well.  He was seen by a neurologist recently over at [facility] and had EMG studies.  I reviewed these with him today.  He states that the chronic lower extremity pain has gradually worsened over the years and he is currently having some new onset left upper extremity pain.  He is also having some weakness affecting the left hand where he is dropping objects occasionally.  His work-up with his neurologist is continuing.  Otherwise no troubles with his medications are noted and these continue to give him good relief.  They have enabled him to stay relatively active sleep better and keep his pain under good control.    Observations/Objective:  Current Outpatient Medications:  .  albuterol (PROVENTIL HFA;VENTOLIN HFA) 108 (90 Base) MCG/ACT inhaler, Inhale 2 puffs into the lungs every 6 (six) hours as needed for wheezing or shortness of breath., Disp: , Rfl:  .  albuterol (PROVENTIL) (2.5 MG/3ML) 0.083% nebulizer solution, Take 2.5 mg by nebulization every 6 (six) hours as needed for wheezing or shortness of breath., Disp: , Rfl:  .  aspirin EC 81 MG tablet, Take 1 tablet (81 mg total) by mouth  daily., Disp: 90 tablet, Rfl: 3 .  betamethasone valerate ointment (VALISONE) 0.1 %, Apply topically., Disp: , Rfl:  .  cholecalciferol (VITAMIN D3) 25 MCG (1000 UT) tablet, Take 2,000 Units by mouth daily., Disp: , Rfl:  .  clopidogrel (PLAVIX) 75 MG tablet, Take 1 tablet (75 mg total) by mouth daily with breakfast. (Patient taking differently: Take 75 mg by mouth daily with breakfast. Pt stop taking 10 day ago for procedure), Disp: 30 tablet, Rfl: 6 .  Cyanocobalamin (B-12 COMPLIANCE INJECTION) 1000 MCG/ML KIT, Inject 1,000 mcg as directed every 30 (thirty) days., Disp: , Rfl:  .  cyclobenzaprine (FLEXERIL) 10 MG tablet, Take 1 tablet by mouth 2 (two) times daily., Disp: , Rfl:  .  DEXILANT 60 MG capsule, TAKE ONE CAPSULE BY MOUTH DAILY, Disp: 30 capsule, Rfl: 11 .  DULoxetine (CYMBALTA) 30 MG capsule, TAKE 1 CAPSULE BY MOUTH DAILY FOR 1 WEEK THEN 2 CAPSULES DAILY, Disp: 60 capsule, Rfl: 0 .  ezetimibe (ZETIA) 10 MG tablet, Take 1 tablet (10 mg total) by mouth daily., Disp: 90 tablet, Rfl: 3 .  fluticasone furoate-vilanterol (BREO ELLIPTA) 200-25 MCG/INH AEPB, Inhale 1 puff into the lungs daily., Disp: 3 each, Rfl: 1 .  furosemide (LASIX) 20 MG tablet, Take 20 mg by mouth every other day., Disp: , Rfl:  .  gabapentin (NEURONTIN) 300 MG capsule, Take 1 capsule (300 mg total) by mouth 3 (three) times daily., Disp: 90 capsule, Rfl: 1 .  gabapentin (NEURONTIN) 800 MG tablet, Take 800  mg by mouth at bedtime., Disp: , Rfl:  .  insulin lispro (HUMALOG KWIKPEN) 100 UNIT/ML KiwkPen, Inject 7-8 Units into the skin 3 (three) times daily. Reported on 12/02/2015/ sliding scale 1 unit for every 8 units of carbs; and 1 unit for every 20 above 120, Disp: , Rfl:  .  Insulin Pen Needle (FIFTY50 PEN NEEDLES) 32G X 4 MM MISC, Inject 1 each into the skin as needed., Disp: , Rfl:  .  LANTUS SOLOSTAR 100 UNIT/ML Solostar Pen, Inject 25-30 Units into the skin daily at 10 pm. , Disp: , Rfl: 0 .  levocetirizine (XYZAL) 5 MG  tablet, TAKE 1 TABLET(5 MG) BY MOUTH EVERY EVENING, Disp: 30 tablet, Rfl: 3 .  levothyroxine (SYNTHROID) 25 MCG tablet, TAKE 1 TABLET(25 MCG) BY MOUTH DAILY BEFORE BREAKFAST, Disp: 90 tablet, Rfl: 0 .  liraglutide (VICTOZA) 18 MG/3ML SOPN, 1.8 units every morning (Patient taking differently: Inject 1.8 mg into the skin daily. ), Disp: , Rfl:  .  loratadine (CLARITIN) 10 MG tablet, Take 10 mg by mouth daily. , Disp: , Rfl:  .  losartan (COZAAR) 25 MG tablet, Take 1 tablet (25 mg total) by mouth daily., Disp: 30 tablet, Rfl: 5 .  metFORMIN (GLUCOPHAGE) 1000 MG tablet, Take 1,000 mg by mouth 2 (two) times daily with a meal., Disp: , Rfl:  .  metoprolol tartrate (LOPRESSOR) 50 MG tablet, Take 1 tablet by mouth 2 (two) times daily., Disp: , Rfl:  .  montelukast (SINGULAIR) 10 MG tablet, Take 10 mg by mouth at bedtime., Disp: , Rfl:  .  Multiple Vitamins-Minerals (MENS MULTI VITAMIN & MINERAL PO), Take 1 tablet by mouth daily., Disp: , Rfl:  .  naloxone (NARCAN) nasal spray 4 mg/0.1 mL, For excess sedation from opioids, Disp: 1 kit, Rfl: 2 .  nitroGLYCERIN (NITROSTAT) 0.3 MG SL tablet, Place 1 tablet (0.3 mg total) under the tongue every 5 (five) minutes as needed for chest pain. Maximum 3 doses., Disp: 25 tablet, Rfl: 3 .  [START ON 10/17/2019] Oxycodone HCl 10 MG TABS, Take 1 tablet (10 mg total) by mouth 4 (four) times daily., Disp: 120 tablet, Rfl: 0 .  [START ON 11/16/2019] Oxycodone HCl 10 MG TABS, Take 1 tablet (10 mg total) by mouth 4 (four) times daily., Disp: 120 tablet, Rfl: 0 .  pyridostigmine (MESTINON) 60 MG tablet, Take 1 tablet by mouth 3 (three) times daily., Disp: , Rfl:  .  rosuvastatin (CRESTOR) 5 MG tablet, Take 5 mg by mouth daily., Disp: , Rfl: 0 .  tamsulosin (FLOMAX) 0.4 MG CAPS capsule, 0.8 mg daily, Disp: 60 capsule, Rfl: 0  Assessment and Plan:  1. Lumbar spondylosis with myelopathy   2. DDD (degenerative disc disease), lumbar   3. Chronic pain syndrome   4. Chronic,  continuous use of opioids   5. Lyme arthritis of multiple joints ([place])   6. Facet arthritis of lumbosacral region   7. Cervicalgia   8. Sciatica, left side   9. Left arm weakness   Based on our discussion today I think it is appropriate to refill his medications this will be for January 12 February 11.  I have reviewed the [facility] practitioner database information.  It is appropriate.  I want him to continue follow-up with his neurologist regarding the new onset left upper extremity weakness.  Furthermore he is to continue with his neurologist regarding his chronic myasthenia gravis which is further verified on the EMG studies.  He may be a candidate  for ongoing epidural steroids if indicated.  I will await the findings from his neurologist and defer on any further injections at this point.  We will schedule him for 43-monthreturn to clinic.    Follow Up Instructions:    I discussed the assessment and treatment plan with the patient. The patient was provided an opportunity to ask questions and all were answered. The patient agreed with the plan and demonstrated an understanding of the instructions.   The patient was advised to call back or seek an in-person evaluation if the symptoms worsen or if the condition fails to improve as anticipated.  I provided 30 minutes of non-face-to-face time during this encounter.   JMolli Barrows MD

## 2019-10-12 ENCOUNTER — Other Ambulatory Visit: Payer: Self-pay | Admitting: Family Medicine

## 2019-10-12 DIAGNOSIS — F411 Generalized anxiety disorder: Secondary | ICD-10-CM

## 2019-10-12 DIAGNOSIS — F331 Major depressive disorder, recurrent, moderate: Secondary | ICD-10-CM

## 2019-10-16 ENCOUNTER — Encounter: Payer: Self-pay | Admitting: Family Medicine

## 2019-10-16 ENCOUNTER — Ambulatory Visit (INDEPENDENT_AMBULATORY_CARE_PROVIDER_SITE_OTHER): Payer: Medicare Other | Admitting: Family Medicine

## 2019-10-16 ENCOUNTER — Other Ambulatory Visit: Payer: Self-pay

## 2019-10-16 VITALS — BP 106/64 | HR 93 | Temp 96.6°F | Resp 16 | Ht 67.0 in | Wt 245.3 lb

## 2019-10-16 DIAGNOSIS — E559 Vitamin D deficiency, unspecified: Secondary | ICD-10-CM | POA: Diagnosis not present

## 2019-10-16 DIAGNOSIS — E538 Deficiency of other specified B group vitamins: Secondary | ICD-10-CM | POA: Diagnosis not present

## 2019-10-16 DIAGNOSIS — I5022 Chronic systolic (congestive) heart failure: Secondary | ICD-10-CM

## 2019-10-16 DIAGNOSIS — F331 Major depressive disorder, recurrent, moderate: Secondary | ICD-10-CM

## 2019-10-16 DIAGNOSIS — E063 Autoimmune thyroiditis: Secondary | ICD-10-CM | POA: Diagnosis not present

## 2019-10-16 DIAGNOSIS — E1165 Type 2 diabetes mellitus with hyperglycemia: Secondary | ICD-10-CM | POA: Diagnosis not present

## 2019-10-16 DIAGNOSIS — E1142 Type 2 diabetes mellitus with diabetic polyneuropathy: Secondary | ICD-10-CM | POA: Diagnosis not present

## 2019-10-16 DIAGNOSIS — F411 Generalized anxiety disorder: Secondary | ICD-10-CM

## 2019-10-16 DIAGNOSIS — G7000 Myasthenia gravis without (acute) exacerbation: Secondary | ICD-10-CM | POA: Diagnosis not present

## 2019-10-16 DIAGNOSIS — Z794 Long term (current) use of insulin: Secondary | ICD-10-CM

## 2019-10-16 DIAGNOSIS — I700 Atherosclerosis of aorta: Secondary | ICD-10-CM

## 2019-10-16 MED ORDER — CYANOCOBALAMIN 1000 MCG/ML IJ SOLN
1000.0000 ug | Freq: Once | INTRAMUSCULAR | Status: AC
  Administered 2019-10-16: 1000 ug via INTRAMUSCULAR

## 2019-10-16 MED ORDER — LEVOTHYROXINE SODIUM 25 MCG PO TABS: 25.0000 ug | ORAL_TABLET | Freq: Every day | ORAL | 0 refills | Status: DC

## 2019-10-16 MED ORDER — DULOXETINE HCL 30 MG PO CPEP: 30.0000 mg | ORAL_CAPSULE | Freq: Every day | ORAL | 1 refills | Status: DC

## 2019-10-16 NOTE — Addendum Note (Signed)
Addended by: [name] F on: 10/16/2019 02:29 PM   Modules accepted: Orders

## 2019-10-16 NOTE — Progress Notes (Addendum)
Name: [name]   MRN: [id number]    DOB: 1960-11-04   Date:10/16/2019       Progress Note  Subjective  Chief Complaint  Chief Complaint  Patient presents with  . Diabetes  . Hypothyroidism  . Depression    HPI  Myasthenia Gravis: sees Neurologist at [facility], had recent EMG and is going for MRI c-spine and lumbar spine for further evaluation. He still has ptosis of left eye. Uses cane to help with ambulation Dr. Westley Foots at [facility] Abnormal study. These electrodiagnostic findings are most consistent with 1) severe axonal sensorymotor polyneuropathy 2) left chronic L5-S1 lumbar radiculopathy  3) left chronic C8-T1 radiculopathy There are no EDX findings consistent for NMJ transmission dysfunction based on this test results.  B12 deficiency: he has been on supplementation and last level done 07/10/2019 at [facility] was 268 and methylmalonic acid also low at 0.59.  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 Raul Del   Family history of anal cancer: father recently diagnosed with anal cancer recently, we will ask Dr. Allen Norris to see if he needs to go back sooner. Dr. Allen Norris states based on guidelines no need since father is over 81 at diagnosis   MDD/GAD: he is very upset about finding out that his father has anal cancer and is 47 yo, his father lives in [place] and is on his third marriage and because his dementia his current wife is trying to change his will and power of attorney of health care. He is not able to sleep lately because of litigations that his brothers and himself started against his father's wife. He  has long of depression,  never took medication in the past, but we started Duloxetine in the Fall and states taking only 30 mg daily and seems to be working well for him   Chronic CHF: sees cardiologist , taking medication as prescribed, mild orthopnea but stable. No recent chest pain he has sob with mild activity   Vitamin D deficiency: continue supplementation   Diabetes type II : he is seeing Dr. Honor Junes, on Metformin and Victoza, also on pre-meal and basal insulin. He denies polyphagia, polydipsia or polyuria ( except for urologist problems) . He has occasional hypoglycemia 60-80. He has a follow up with Endo soon  Chronic back pain: he also has diabetic neuropathy, uses a cane  Patient Active Problem List   Diagnosis Date Noted  . Left arm weakness 10/04/2019  . Lower urinary tract symptoms 09/15/2019  . Incomplete bladder emptying 09/15/2019  . Carpal tunnel syndrome on both sides 07/12/2019  . Weakness generalized 06/21/2019  . Idiopathic peripheral neuropathy 04/21/2019  . Sensory ataxia 03/09/2019  . Colitis 07/08/2018  . OSA on CPAP 07/08/2018  . Congestive heart failure (CHF) ([place]) 05/05/2018  . Unstable angina ([place]) 04/26/2018  . Ischemic cardiomyopathy 04/26/2018  . Chronic combined systolic and diastolic CHF (congestive heart failure) ([place]) 04/26/2018  . Effort angina ([place]) 04/25/2018  . 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[place]) 01/25/2018  . Hyperlipidemia due to type 2 diabetes mellitus ([place]) 08/12/2017  .  Vitamin D deficiency, unspecified 08/12/2017  . Ptosis of left eyelid 01/08/2017  . Dermatitis 12/24/2016  . Difficulty walking 04/27/2016  . Foot cramps 04/27/2016  . Morbid (severe) obesity due to excess calories ([place]) 04/06/2016  . Benign neoplasm of sigmoid colon   . Benign neoplasm of descending colon   . Benign neoplasm of transverse colon   . Coronary artery disease involving native coronary artery with angina pectoris ([place]) 10/22/2015  . Coronary artery disease  08/22/2015  . DM (diabetes mellitus), type 2, uncontrolled with complications ([place]) 15/17/6160  . Myasthenia gravis ([place]) 08/22/2015  . Chronic left-sided low back pain 08/22/2015  . Hypertension 08/22/2015  . GERD (gastroesophageal reflux disease) 08/22/2015  . Arthritis 08/22/2015  . Diabetic peripheral neuropathy ([place]) 08/22/2015  . Hyperlipidemia 08/22/2015  . Insomnia 08/22/2015  . Type 2 diabetes mellitus with diabetic polyneuropathy, with long-term current use of insulin ([place]) 08/22/2015  . Asthma 08/22/2015  . Lyme disease 06/05/1992    Past Surgical History:  Procedure Laterality Date  . BILATERAL CARPAL TUNNEL RELEASE Bilateral L in 2012 and R in 2013  . CARDIAC CATHETERIZATION     Several Caths, most recent in  March 2016.  [name] COLONOSCOPY WITH PROPOFOL N/A 01/10/2016   Procedure: COLONOSCOPY WITH PROPOFOL;  Surgeon: [name], MD;  Location: [facility] ENDOSCOPY;  Service: Endoscopy;  Laterality: N/A;  . CORONARY ANGIOPLASTY    . CORONARY STENT INTERVENTION N/A 04/25/2018   Procedure: CORONARY STENT INTERVENTION;  Surgeon: [name], MD;  Location: [place] [facility];  Service: Cardiovascular;  Laterality: N/A;  . ESOPHAGOGASTRODUODENOSCOPY (EGD) WITH PROPOFOL N/A 01/10/2016   Procedure: ESOPHAGOGASTRODUODENOSCOPY (EGD) WITH PROPOFOL;  Surgeon: [name], MD;  Location: [facility] ENDOSCOPY;  Service: Endoscopy;  Laterality: N/A;  . EYE SURGERY Bilateral 2012   cataract/bilateral vitrectomies  . LEFT HEART CATH AND CORONARY ANGIOGRAPHY Left 04/25/2018   Procedure: LEFT HEART CATH AND CORONARY ANGIOGRAPHY;  Surgeon: [name], MD;  Location: [place] [facility];  Service: Cardiovascular;  Laterality: Left;  . TEE WITHOUT CARDIOVERSION N/A 09/05/2018   Procedure: TRANSESOPHAGEAL ECHOCARDIOGRAM (TEE);  Surgeon: [name], MD;  Location: [facility] ORS;  Service: Cardiovascular;  Laterality: N/A;  . TONSILLECTOMY AND ADENOIDECTOMY     As a child  . TUNNELED VENOUS  CATHETER PLACEMENT     removed    Family History  Problem Relation Age of Onset  . Diabetes Mother   . Heart disease Mother   . Cancer Father        Prostate CA, Anal cancer   . Dementia Father   . Diabetes Brother   . Healthy Brother   . Healthy Brother       Current Outpatient Medications:  .  albuterol (PROVENTIL HFA;VENTOLIN HFA) 108 (90 Base) MCG/ACT inhaler, Inhale 2 puffs into the lungs every 6 (six) hours as needed for wheezing or shortness of breath., Disp: , Rfl:  .  albuterol (PROVENTIL) (2.5 MG/3ML) 0.083% nebulizer solution, Take 2.5 mg by nebulization every 6 (six) hours as needed for wheezing or shortness of breath., Disp: , Rfl:  .  aspirin EC 81 MG tablet, Take 1 tablet (81 mg total) by mouth daily., Disp: 90 tablet, Rfl: 3 .  betamethasone valerate ointment (VALISONE) 0.1 %, Apply topically., Disp: , Rfl:  .  cholecalciferol (VITAMIN D3) 25 MCG (1000 UT) tablet, Take 2,000 Units by mouth daily., Disp: , Rfl:  .  clopidogrel (PLAVIX) 75 MG tablet, Take 1 tablet (75 mg total) by mouth daily  with breakfast. (Patient taking differently: Take 75 mg by mouth daily with breakfast. Pt stop taking 10 day ago for procedure), Disp: 30 tablet, Rfl: 6 .  Cyanocobalamin (B-12 COMPLIANCE INJECTION) 1000 MCG/ML KIT, Inject 1,000 mcg as directed every 30 (thirty) days., Disp: , Rfl:  .  cyclobenzaprine (FLEXERIL) 10 MG tablet, Take 1 tablet by mouth 2 (two) times daily., Disp: , Rfl:  .  DEXILANT 60 MG capsule, TAKE ONE CAPSULE BY MOUTH DAILY, Disp: 30 capsule, Rfl: 11 .  DULoxetine (CYMBALTA) 30 MG capsule, TAKE 1 CAPSULE BY MOUTH DAILY FOR 1 WEEK THEN 2 CAPSULES DAILY, Disp: 60 capsule, Rfl: 0 .  fluticasone furoate-vilanterol (BREO ELLIPTA) 200-25 MCG/INH AEPB, Inhale 1 puff into the lungs daily., Disp: 3 each, Rfl: 1 .  furosemide (LASIX) 20 MG tablet, Take 20 mg by mouth every other day., Disp: , Rfl:  .  gabapentin (NEURONTIN) 300 MG capsule, Take 1 capsule (300 mg total) by  mouth 3 (three) times daily., Disp: 90 capsule, Rfl: 1 .  gabapentin (NEURONTIN) 800 MG tablet, Take 800 mg by mouth at bedtime., Disp: , Rfl:  .  insulin lispro (HUMALOG KWIKPEN) 100 UNIT/ML KiwkPen, Inject 7-8 Units into the skin 3 (three) times daily. Reported on 12/02/2015/ sliding scale 1 unit for every 8 units of carbs; and 1 unit for every 20 above 120, Disp: , Rfl:  .  Insulin Pen Needle (FIFTY50 PEN NEEDLES) 32G X 4 MM MISC, Inject 1 each into the skin as needed., Disp: , Rfl:  .  LANTUS SOLOSTAR 100 UNIT/ML Solostar Pen, Inject 25-30 Units into the skin daily at 10 pm. , Disp: , Rfl: 0 .  levocetirizine (XYZAL) 5 MG tablet, TAKE 1 TABLET(5 MG) BY MOUTH EVERY EVENING, Disp: 30 tablet, Rfl: 3 .  levothyroxine (SYNTHROID) 25 MCG tablet, TAKE 1 TABLET(25 MCG) BY MOUTH DAILY BEFORE BREAKFAST, Disp: 90 tablet, Rfl: 0 .  liraglutide (VICTOZA) 18 MG/3ML SOPN, 1.8 units every morning (Patient taking differently: Inject 1.8 mg into the skin daily. ), Disp: , Rfl:  .  loratadine (CLARITIN) 10 MG tablet, Take 10 mg by mouth daily. , Disp: , Rfl:  .  metFORMIN (GLUCOPHAGE) 1000 MG tablet, Take 1,000 mg by mouth 2 (two) times daily with a meal., Disp: , Rfl:  .  metoprolol tartrate (LOPRESSOR) 50 MG tablet, Take 1 tablet by mouth 2 (two) times daily., Disp: , Rfl:  .  montelukast (SINGULAIR) 10 MG tablet, Take 10 mg by mouth at bedtime., Disp: , Rfl:  .  Multiple Vitamins-Minerals (MENS MULTI VITAMIN & MINERAL PO), Take 1 tablet by mouth daily., Disp: , Rfl:  .  naloxone (NARCAN) nasal spray 4 mg/0.1 mL, For excess sedation from opioids, Disp: 1 kit, Rfl: 2 .  nitroGLYCERIN (NITROSTAT) 0.3 MG SL tablet, Place 1 tablet (0.3 mg total) under the tongue every 5 (five) minutes as needed for chest pain. Maximum 3 doses., Disp: 25 tablet, Rfl: 3 .  [START ON 10/17/2019] Oxycodone HCl 10 MG TABS, Take 1 tablet (10 mg total) by mouth 4 (four) times daily., Disp: 120 tablet, Rfl: 0 .  [START ON 11/16/2019] Oxycodone  HCl 10 MG TABS, Take 1 tablet (10 mg total) by mouth 4 (four) times daily., Disp: 120 tablet, Rfl: 0 .  pyridostigmine (MESTINON) 60 MG tablet, Take 1 tablet by mouth 3 (three) times daily., Disp: , Rfl:  .  rosuvastatin (CRESTOR) 5 MG tablet, Take 5 mg by mouth daily., Disp: , Rfl: 0 .  tamsulosin (  FLOMAX) 0.4 MG CAPS capsule, 0.8 mg daily, Disp: 60 capsule, Rfl: 0 .  ezetimibe (ZETIA) 10 MG tablet, Take 1 tablet (10 mg total) by mouth daily., Disp: 90 tablet, Rfl: 3 .  losartan (COZAAR) 25 MG tablet, Take 1 tablet (25 mg total) by mouth daily., Disp: 30 tablet, Rfl: 5  Allergies  Allergen Reactions  . Azathioprine Other (See Comments)    Azathioprine hypersensitivity reaction  . Novolog [Insulin Aspart] Hives    I personally reviewed active problem list, medication list, allergies, family history, social history with the patient/caregiver today.   ROS  Ten systems reviewed and is negative except as mentioned in HPI   Objective  Vitals:   10/16/19 1323  BP: 106/64  Pulse: 93  Resp: 16  Temp: (!) 96.6 F (35.9 C)  TempSrc: Temporal  SpO2: 96%  Weight: 245 lb 4.8 oz (111.3 kg)  Height: '5\' 7"'$  (1.702 m)    Body mass index is 38.42 kg/m.  Physical Exam  Constitutional: Patient appears well-developed and well-nourished. Obese  No distress.  HEENT: head atraumatic, normocephalic, pupils equal and reactive to light, left ptosis  Cardiovascular: Normal rate, regular rhythm and normal heart sounds.  No murmur heard. No BLE edema. Pulmonary/Chest: Effort normal , he has end inspiratory wheezing.  No respiratory distress. Abdominal: Soft.  There is no tenderness. Psychiatric: Patient has a normal mood and affect. behavior is normal. Judgment and thought content normal.  Recent Results (from the past 2160 hour(s))  HM DIABETES EYE EXAM     Status: Abnormal   Collection Time: 08/02/19 12:00 AM  Result Value Ref Range   HM Diabetic Eye Exam Retinopathy (A) No Retinopathy     Comment: No treatment needed, in both eyes. Proliferative Diabetic Re  B Nat Peptide     Status: None   Collection Time: 08/07/19  3:16 PM  Result Value Ref Range   BNP 84.9 0.0 - 100.0 pg/mL  Basic metabolic panel     Status: Abnormal   Collection Time: 08/07/19  3:17 PM  Result Value Ref Range   Glucose 161 (H) 65 - 99 mg/dL   BUN 26 8 - 27 mg/dL   Creatinine, Ser 1.35 (H) 0.76 - 1.27 mg/dL   GFR calc non Af Amer 57 (L) >59 mL/min/1.73   GFR calc Af Amer 66 >59 mL/min/1.73   BUN/Creatinine Ratio 19 10 - 24   Sodium 140 134 - 144 mmol/L   Potassium 4.9 3.5 - 5.2 mmol/L   Chloride 106 96 - 106 mmol/L   CO2 24 20 - 29 mmol/L   Calcium 8.9 8.6 - 10.2 mg/dL  Urinalysis, Complete     Status: None   Collection Time: 09/15/19  1:26 PM  Result Value Ref Range   Specific Gravity, UA 1.010 1.005 - 1.030   pH, UA 5.5 5.0 - 7.5   Color, UA Yellow Yellow   Appearance Ur Clear Clear   Leukocytes,UA Negative Negative   Protein,UA Negative Negative/Trace   Glucose, UA Negative Negative   Ketones, UA Negative Negative   RBC, UA Negative Negative   Bilirubin, UA Negative Negative   Urobilinogen, Ur 0.2 0.2 - 1.0 mg/dL   Nitrite, UA Negative Negative   Microscopic Examination See below:   Microscopic Examination     Status: None   Collection Time: 09/15/19  1:26 PM   URINE  Result Value Ref Range   WBC, UA None seen 0 - 5 /hpf   RBC None seen 0 -  2 /hpf   Epithelial Cells (non renal) None seen 0 - 10 /hpf   Bacteria, UA None seen None seen/Few  Bladder Scan (Post Void Residual) in office     Status: None   Collection Time: 09/15/19  2:01 PM  Result Value Ref Range   Scan Result 247m       PHQ2/9: [facility] 10/16/2019 07/21/2019 07/18/2019 06/14/2019 05/05/2019  Decreased Interest '1 2 3 1 '$ 0  Down, Depressed, Hopeless '1 3 3 1 '$ 0  PHQ - 2 Score '2 5 6 2 '$ 0  Altered sleeping '3 2 3 2 '$ 0  Tired, decreased energy '1 1 3 2 '$ 0  Change in appetite '2 2 3 3 '$ 0  Feeling bad or  failure about yourself  0 0 1 1 0  Trouble concentrating '1 3 2 '$ 0 0  Moving slowly or fidgety/restless 0 '1 1 1 '$ 0  Suicidal thoughts 0 0 0 0 0  PHQ-9 Score '9 14 19 11 '$ 0  Difficult doing work/chores Somewhat difficult Somewhat difficult Somewhat difficult Somewhat difficult Not difficult at all  Some recent data might be hidden    phq 9 is positive   Fall Risk: Fall Risk  10/16/2019 08/16/2019 07/18/2019 06/14/2019 05/24/2019  Falls in the past year? 0 1 0 0 0  Comment - - - - -  Number falls in past yr: 0 0 0 0 -  Comment - Pt slipped went down on knee, twisted his back, increased pain, heat helped - - -  Injury with Fall? 0 - 0 0 -  Risk Factor Category  - - - - -  Risk for fall due to : - - - - -  Risk for fall due to: Comment - - - - -  Follow up - - Falls prevention discussed - -     Functional Status Survey: Is the patient deaf or have difficulty hearing?: Yes Does the patient have difficulty seeing, even when wearing glasses/contacts?: No Does the patient have difficulty concentrating, remembering, or making decisions?: No Does the patient have difficulty walking or climbing stairs?: Yes Does the patient have difficulty dressing or bathing?: No Does the patient have difficulty doing errands alone such as visiting a doctor's office or shopping?: No    Assessment & Plan  1. Moderate episode of recurrent major depressive disorder (HCC)  - DULoxetine (CYMBALTA) 30 MG capsule; Take 1 capsule (30 mg total) by mouth daily.  Dispense: 90 capsule; Refill: 1  2. GAD (generalized anxiety disorder)  - DULoxetine (CYMBALTA) 30 MG capsule; Take 1 capsule (30 mg total) by mouth daily.  Dispense: 90 capsule; Refill: 1  3. [place]henia gravis ([place]   4. B12 deficiency  - Vitamin B12  5. Hypothyroidism, acquired, autoimmune  - levothyroxine (SYNTHROID) 25 MCG tablet; Take 1 tablet (25 mcg total) by mouth daily before breakfast.  Dispense: 90 tablet; Refill: 0 - TSH  6. Type 2  diabetes mellitus with diabetic polyneuropathy, with long-term current use of insulin (HCC)  - Hemoglobin A1c  7. Chronic systolic CHF (congestive heart failure) (HCC)  - COMPLETE METABOLIC PANEL WITH GFR  8. Atherosclerosis of aorta (HCC)  - Lipid panel  9. Vitamin D deficiency  - VITAMIN D 25 Hydroxy (Vit-D Deficiency, Fractures)

## 2019-10-17 LAB — TSH: TSH: 3 mIU/L (ref 0.40–4.50)

## 2019-10-17 LAB — COMPLETE METABOLIC PANEL WITH GFR
AG Ratio: 1.8 (calc) (ref 1.0–2.5)
ALT: 18 U/L (ref 9–46)
AST: 17 U/L (ref 10–35)
Albumin: 3.9 g/dL (ref 3.6–5.1)
Alkaline phosphatase (APISO): 63 U/L (ref 35–144)
BUN/Creatinine Ratio: 20 (calc) (ref 6–22)
BUN: 33 mg/dL — ABNORMAL HIGH (ref 7–25)
CO2: 30 mmol/L (ref 20–32)
Calcium: 9.3 mg/dL (ref 8.6–10.3)
Chloride: 105 mmol/L (ref 98–110)
Creat: 1.64 mg/dL — ABNORMAL HIGH (ref 0.70–1.25)
GFR, Est African American: 52 mL/min/{1.73_m2} — ABNORMAL LOW (ref >=60)
GFR, Est Non African American: 45 mL/min/{1.73_m2} — ABNORMAL LOW (ref >=60)
Globulin: 2.2 g/dL (calc) (ref 1.9–3.7)
Glucose, Bld: 112 mg/dL — ABNORMAL HIGH (ref 65–99)
Potassium: 5 mmol/L (ref 3.5–5.3)
Sodium: 140 mmol/L (ref 135–146)
Total Bilirubin: 0.4 mg/dL (ref 0.2–1.2)
Total Protein: 6.1 g/dL (ref 6.1–8.1)

## 2019-10-17 LAB — LIPID PANEL
Cholesterol: 86 mg/dL (ref <200)
HDL: 31 mg/dL — ABNORMAL LOW (ref >=40)
LDL Cholesterol (Calc): 34 mg/dL (calc)
Non-HDL Cholesterol (Calc): 55 mg/dL (calc) (ref <130)
Total CHOL/HDL Ratio: 2.8 (calc) (ref <5.0)
Triglycerides: 126 mg/dL (ref <150)

## 2019-10-17 LAB — VITAMIN B12: Vitamin B-12: 356 pg/mL (ref 200–1100)

## 2019-10-17 LAB — HEMOGLOBIN A1C
Hgb A1c MFr Bld: 7.5 % of total Hgb — ABNORMAL HIGH (ref <5.7)
Mean Plasma Glucose: 169 (calc)
eAG (mmol/L): 9.3 (calc)

## 2019-10-17 LAB — VITAMIN D 25 HYDROXY (VIT D DEFICIENCY, FRACTURES): Vit D, 25-Hydroxy: 72 ng/mL (ref 30–100)

## 2019-10-18 DIAGNOSIS — M9973 Connective tissue and disc stenosis of intervertebral foramina of lumbar region: Secondary | ICD-10-CM | POA: Diagnosis not present

## 2019-10-18 DIAGNOSIS — M48061 Spinal stenosis, lumbar region without neurogenic claudication: Secondary | ICD-10-CM | POA: Diagnosis not present

## 2019-10-18 DIAGNOSIS — M9971 Connective tissue and disc stenosis of intervertebral foramina of cervical region: Secondary | ICD-10-CM | POA: Diagnosis not present

## 2019-10-18 DIAGNOSIS — G7000 Myasthenia gravis without (acute) exacerbation: Secondary | ICD-10-CM | POA: Diagnosis not present

## 2019-10-18 DIAGNOSIS — M4802 Spinal stenosis, cervical region: Secondary | ICD-10-CM | POA: Diagnosis not present

## 2019-10-18 DIAGNOSIS — M4726 Other spondylosis with radiculopathy, lumbar region: Secondary | ICD-10-CM | POA: Diagnosis not present

## 2019-10-18 DIAGNOSIS — M4722 Other spondylosis with radiculopathy, cervical region: Secondary | ICD-10-CM | POA: Diagnosis not present

## 2019-10-20 ENCOUNTER — Ambulatory Visit: Payer: Medicare Other | Admitting: Urology

## 2019-10-20 ENCOUNTER — Encounter: Payer: Self-pay | Admitting: Urology

## 2019-10-20 ENCOUNTER — Other Ambulatory Visit: Payer: Self-pay

## 2019-10-20 VITALS — BP 132/86 | HR 97 | Ht 69.0 in | Wt 245.0 lb

## 2019-10-20 DIAGNOSIS — R399 Unspecified symptoms and signs involving the genitourinary system: Secondary | ICD-10-CM

## 2019-10-20 DIAGNOSIS — R339 Retention of urine, unspecified: Secondary | ICD-10-CM

## 2019-10-20 LAB — URINALYSIS, COMPLETE
Bilirubin, UA: NEGATIVE
Glucose, UA: NEGATIVE
Ketones, UA: NEGATIVE
Leukocytes,UA: NEGATIVE
Nitrite, UA: NEGATIVE
Protein,UA: NEGATIVE
RBC, UA: NEGATIVE
Specific Gravity, UA: 1.015 (ref 1.005–1.030)
Urobilinogen, Ur: 0.2 mg/dL (ref 0.2–1.0)
pH, UA: 6 (ref 5.0–7.5)

## 2019-10-20 LAB — BLADDER SCAN AMB NON-IMAGING: Scan Result: 137

## 2019-10-20 LAB — MICROSCOPIC EXAMINATION
Bacteria, UA: NONE SEEN
RBC, Urine: NONE SEEN /hpf (ref 0–2)
WBC, UA: NONE SEEN /hpf (ref 0–5)

## 2019-10-20 NOTE — Progress Notes (Signed)
   10/20/19  CC: No chief complaint on file.   HPI: Refer to my prior progress note 09/15/2019.  He feels his symptoms improved slightly with tamsulosin 0.8 mg.  PVR by bladder scan today was improved at 170 mL.  Blood pressure 132/86, pulse 97, height 5\' 9"  (1.753 m), weight 245 lb (111.1 kg). NED. A&Ox3.   No respiratory distress   Abd soft, NT, ND Normal phallus with bilateral descended testicles  Cystoscopy Procedure Note  Patient identification was confirmed, informed consent was obtained, and patient was prepped using Betadine solution.  Lidocaine jelly was administered per urethral meatus.     Pre-Procedure: - Inspection reveals a normal caliber urethral meatus.  Procedure: The flexible cystoscope was introduced without difficulty - No urethral strictures/lesions are present. - No significant lateral lobe enlargement prostate  - Mild elevation bladder neck - Bilateral ureteral orifices identified - Bladder mucosa  reveals no ulcers, tumors, or lesions - No bladder stones - No trabeculation  Retroflexion shows no abnormalities   Post-Procedure: - Patient tolerated the procedure well  Assessment/ Plan: No significant occlusion of outlet noted on cystoscopy.  He does have chronic back pain and lumbar radiculopathy.  I recommended scheduling a urodynamic study to further assess for the possibility of outlet obstruction.   Abbie Sons, MD

## 2019-10-23 ENCOUNTER — Other Ambulatory Visit: Payer: Self-pay

## 2019-10-23 ENCOUNTER — Encounter: Payer: Self-pay | Admitting: Urology

## 2019-10-23 MED ORDER — METOPROLOL TARTRATE 50 MG PO TABS: 50.0000 mg | ORAL_TABLET | Freq: Two times a day (BID) | ORAL | 5 refills | Status: DC

## 2019-10-24 ENCOUNTER — Other Ambulatory Visit: Payer: Self-pay | Admitting: *Deleted

## 2019-10-24 ENCOUNTER — Other Ambulatory Visit: Payer: Self-pay | Admitting: Family Medicine

## 2019-10-24 DIAGNOSIS — J302 Other seasonal allergic rhinitis: Secondary | ICD-10-CM

## 2019-10-24 MED ORDER — TAMSULOSIN HCL 0.4 MG PO CAPS: ORAL_CAPSULE | ORAL | 0 refills | Status: DC

## 2019-10-24 NOTE — Telephone Encounter (Signed)
Requested Prescriptions  Pending Prescriptions Disp Refills  . levocetirizine (XYZAL) 5 MG tablet [Pharmacy Med Name: LEVOCETIRIZINE 5MG  TABLETS] 30 tablet 3    Sig: TAKE 1 TABLET(5 MG) BY MOUTH EVERY EVENING     Ear, Nose, and Throat:  Antihistamines Passed - 10/24/2019  3:36 AM      Passed - Valid encounter within last 12 months    Recent Outpatient Visits          1 week ago Myasthenia gravis [facility])   [facility] [name], MD   3 months ago Hypothyroidism, acquired, autoimmune   [facility] [place] [facility] [place], Drue Stager, MD   4 months ago Type 2 diabetes mellitus with diabetic polyneuropathy, with long-term current use of insulin [facility])   [facility] [name], MD   5 months ago Urinary hesitancy   [facility] [name], MD   10 months ago Myasthenia gravis [facility])   [facility] [name], MD      Future Appointments            In 2 months Arida, Mertie Clause, MD [facility], [place]   In 9 months  [facility], [facility]

## 2019-10-26 DIAGNOSIS — J4520 Mild intermittent asthma, uncomplicated: Secondary | ICD-10-CM | POA: Diagnosis not present

## 2019-10-26 DIAGNOSIS — G4733 Obstructive sleep apnea (adult) (pediatric): Secondary | ICD-10-CM | POA: Diagnosis not present

## 2019-10-26 DIAGNOSIS — J439 Emphysema, unspecified: Secondary | ICD-10-CM | POA: Diagnosis not present

## 2019-10-27 DIAGNOSIS — E039 Hypothyroidism, unspecified: Secondary | ICD-10-CM | POA: Diagnosis not present

## 2019-10-27 DIAGNOSIS — M4802 Spinal stenosis, cervical region: Secondary | ICD-10-CM | POA: Diagnosis not present

## 2019-10-27 DIAGNOSIS — M5416 Radiculopathy, lumbar region: Secondary | ICD-10-CM | POA: Diagnosis not present

## 2019-10-27 DIAGNOSIS — G7000 Myasthenia gravis without (acute) exacerbation: Secondary | ICD-10-CM | POA: Diagnosis not present

## 2019-10-27 DIAGNOSIS — E538 Deficiency of other specified B group vitamins: Secondary | ICD-10-CM | POA: Diagnosis not present

## 2019-11-12 ENCOUNTER — Other Ambulatory Visit: Payer: Self-pay | Admitting: Family Medicine

## 2019-11-12 DIAGNOSIS — F331 Major depressive disorder, recurrent, moderate: Secondary | ICD-10-CM

## 2019-11-12 DIAGNOSIS — F411 Generalized anxiety disorder: Secondary | ICD-10-CM

## 2019-11-12 NOTE — Telephone Encounter (Signed)
Requested medication (s) are due for refill:  Requested medication (s) are on the active medication list: yes  Last refill:  10/16/19  Future visit scheduled: yes  Notes to clinic:  When med ordered 10-12-19 2 capsules daily On 10/16/19 ordered 1 time daily- Discrepancy with AML    Requested Prescriptions  Pending Prescriptions Disp Refills   DULoxetine (CYMBALTA) 30 MG capsule [Pharmacy Med Name: DULOXETINE DR 30MG  CAPSULES] 60 capsule     Sig: TAKE 1 CAPSULE BY MOUTH DAILY FOR 1 WEEK THEN 2 CAPSULES DAILY      Psychiatry: Antidepressants - SNRI Passed - 11/12/2019  8:20 AM      Passed - Last BP in normal range    BP Readings from Last 1 Encounters:  10/20/19 132/86          Passed - Valid encounter within last 6 months    Recent Outpatient Visits           3 weeks ago Myasthenia gravis [facility])   [facility] [name], MD   3 months ago Hypothyroidism, acquired, autoimmune   [facility] [place], Drue Stager, MD   5 months ago Type 2 diabetes mellitus with diabetic polyneuropathy, with long-term current use of insulin [facility])   [facility] [name], MD   6 months ago Urinary hesitancy   [facility] [name], MD   11 months ago Myasthenia gravis [facility])   [facility] [name], MD       Future Appointments             In 2 months Arida, Mertie Clause, MD [facility]   In 8 months  [facility], [facility]

## 2019-11-13 DIAGNOSIS — M4726 Other spondylosis with radiculopathy, lumbar region: Secondary | ICD-10-CM | POA: Diagnosis not present

## 2019-11-13 DIAGNOSIS — M4722 Other spondylosis with radiculopathy, cervical region: Secondary | ICD-10-CM | POA: Diagnosis not present

## 2019-11-14 NOTE — Telephone Encounter (Signed)
Requested medication (s) are due for refill today: yes  Requested medication (s) are on the active medication list:   Last refill:  request pending from 11/12/19  Future visit scheduled: no  Notes to clinic:  please clarify order    Requested Prescriptions  Pending Prescriptions Disp Refills   DULoxetine (CYMBALTA) 30 MG capsule [Pharmacy Med Name: DULOXETINE DR 30MG  CAPSULES] 60 capsule     Sig: TAKE 1 CAPSULE BY MOUTH DAILY FOR 1 WEEK THEN 2 CAPSULES DAILY      Psychiatry: Antidepressants - SNRI Passed - 11/14/2019  1:28 PM      Passed - Last BP in normal range    BP Readings from Last 1 Encounters:  10/20/19 132/86          Passed - Valid encounter within last 6 months    Recent Outpatient Visits           4 weeks ago Myasthenia gravis [facility])   [facility] [name], MD   3 months ago Hypothyroidism, acquired, autoimmune   [facility] [place], Drue Stager, MD   5 months ago Type 2 diabetes mellitus with diabetic polyneuropathy, with long-term current use of insulin [facility])   [facility] [name], MD   6 months ago Urinary hesitancy   [facility] [name], MD   11 months ago Myasthenia gravis [facility])   [facility] [name], MD       Future Appointments             In 2 months Arida, Mertie Clause, MD [facility], [facility]   In 8 months  [facility], [facility]

## 2019-11-15 DIAGNOSIS — Z794 Long term (current) use of insulin: Secondary | ICD-10-CM | POA: Diagnosis not present

## 2019-11-15 DIAGNOSIS — E1165 Type 2 diabetes mellitus with hyperglycemia: Secondary | ICD-10-CM | POA: Diagnosis not present

## 2019-11-15 NOTE — Telephone Encounter (Signed)
Patient is taking two of the 30 mg  Duloxetine and please send new dosage.

## 2019-11-16 ENCOUNTER — Other Ambulatory Visit: Payer: Self-pay | Admitting: Anesthesiology

## 2019-11-21 ENCOUNTER — Telehealth: Payer: Self-pay

## 2019-11-22 DIAGNOSIS — E785 Hyperlipidemia, unspecified: Secondary | ICD-10-CM | POA: Diagnosis not present

## 2019-11-22 DIAGNOSIS — Z794 Long term (current) use of insulin: Secondary | ICD-10-CM | POA: Diagnosis not present

## 2019-11-22 DIAGNOSIS — E1142 Type 2 diabetes mellitus with diabetic polyneuropathy: Secondary | ICD-10-CM | POA: Diagnosis not present

## 2019-11-22 DIAGNOSIS — E1169 Type 2 diabetes mellitus with other specified complication: Secondary | ICD-10-CM | POA: Diagnosis not present

## 2019-11-22 DIAGNOSIS — E1159 Type 2 diabetes mellitus with other circulatory complications: Secondary | ICD-10-CM | POA: Diagnosis not present

## 2019-11-27 ENCOUNTER — Other Ambulatory Visit: Payer: Self-pay | Admitting: *Deleted

## 2019-11-28 ENCOUNTER — Other Ambulatory Visit: Payer: Self-pay | Admitting: *Deleted

## 2019-11-28 DIAGNOSIS — R399 Unspecified symptoms and signs involving the genitourinary system: Secondary | ICD-10-CM

## 2019-11-28 MED ORDER — TAMSULOSIN HCL 0.4 MG PO CAPS: ORAL_CAPSULE | ORAL | 2 refills | Status: DC

## 2019-11-28 NOTE — Telephone Encounter (Signed)
I had discussed getting a urodynamic study at the cystoscopy visit.  If he does not desire to pursue would recommend a follow-up in May with a bladder scan

## 2019-11-30 NOTE — Telephone Encounter (Signed)
Pt has to be evaluated for his long term disability and needs another referral sent for physical therapy /he was not able to schedule when the first referral was sent but has reached out to them and they need a new order/referral asap/ please advise and call Pt when this has been sent

## 2019-12-01 ENCOUNTER — Encounter: Payer: Self-pay | Admitting: Anesthesiology

## 2019-12-01 ENCOUNTER — Ambulatory Visit: Payer: Medicare Other | Attending: Anesthesiology | Admitting: Anesthesiology

## 2019-12-01 ENCOUNTER — Other Ambulatory Visit: Payer: Self-pay

## 2019-12-01 DIAGNOSIS — Z79891 Long term (current) use of opiate analgesic: Secondary | ICD-10-CM | POA: Diagnosis not present

## 2019-12-01 DIAGNOSIS — M4716 Other spondylosis with myelopathy, lumbar region: Secondary | ICD-10-CM

## 2019-12-01 DIAGNOSIS — G894 Chronic pain syndrome: Secondary | ICD-10-CM | POA: Diagnosis not present

## 2019-12-01 DIAGNOSIS — M542 Cervicalgia: Secondary | ICD-10-CM

## 2019-12-01 DIAGNOSIS — M5432 Sciatica, left side: Secondary | ICD-10-CM

## 2019-12-01 DIAGNOSIS — F1190 Opioid use, unspecified, uncomplicated: Secondary | ICD-10-CM

## 2019-12-01 DIAGNOSIS — A6923 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: Secondary | ICD-10-CM

## 2019-12-01 DIAGNOSIS — G7000 Myasthenia gravis without (acute) exacerbation: Secondary | ICD-10-CM

## 2019-12-01 DIAGNOSIS — M47817 Spondylosis without myelopathy or radiculopathy, lumbosacral region: Secondary | ICD-10-CM

## 2019-12-01 DIAGNOSIS — M5136 Other intervertebral disc degeneration, lumbar region: Secondary | ICD-10-CM

## 2019-12-01 DIAGNOSIS — R29898 Other symptoms and signs involving the musculoskeletal system: Secondary | ICD-10-CM

## 2019-12-01 HISTORY — DX: Other symptoms and signs involving the musculoskeletal system: R29.898

## 2019-12-01 MED ORDER — OXYCODONE HCL 10 MG PO TABS: 10.0000 mg | ORAL_TABLET | Freq: Four times a day (QID) | ORAL | 0 refills | Status: DC

## 2019-12-01 MED ORDER — CYCLOBENZAPRINE HCL 10 MG PO TABS: 10.0000 mg | ORAL_TABLET | Freq: Two times a day (BID) | ORAL | 3 refills | Status: DC

## 2019-12-01 NOTE — Progress Notes (Signed)
Virtual Visit via Video Note  I connected with [name] on 12/01/19 at 12:15 PM EST by a video enabled telemedicine application and verified that I am speaking with the correct person using two identifiers.  Location: Patient: Home Provider: Pain control center   I discussed the limitations of evaluation and management by telemedicine and the availability of in person appointments. The patient expressed understanding and agreed to proceed.  History of Present Illness: I spoke with [name] today via video conferencing for his virtual appointment.  Unfortunately Tims having more left lower leg problems.  In the past he has had epidural steroid injections which generally give him about 6 weeks of near complete relief of his lower leg weakness and numbness and tingling and back pain.  However he feels that the lower leg weakness has been more prominent with more of a spindly feeling in his left leg.  He is also had a recent MRI and this was reviewed with him during our evaluation today.  Furthermore he has chronic neck problems with spasming in the neck and some left arm weakness as well.  A cervical MRI was ordered and this was also evaluated today during our conversation.  He is requesting a return for another epidural soon.  He states that in the meantime the medications are working well for him.  Based on our discussion today continues derive good functional lifestyle improvement with the oxycodone.  He is taking this 4 times a day and gets good relief with it.    Observations/Objective:  Current Outpatient Medications:  .  albuterol (PROVENTIL HFA;VENTOLIN HFA) 108 (90 Base) MCG/ACT inhaler, Inhale 2 puffs into the lungs every 6 (six) hours as needed for wheezing or shortness of breath., Disp: , Rfl:  .  albuterol (PROVENTIL) (2.5 MG/3ML) 0.083% nebulizer solution, Take 2.5 mg by nebulization every 6 (six) hours as needed for wheezing or shortness of breath., Disp: , Rfl:  .  aspirin EC  81 MG tablet, Take 1 tablet (81 mg total) by mouth daily., Disp: 90 tablet, Rfl: 3 .  betamethasone valerate ointment (VALISONE) 0.1 %, Apply topically., Disp: , Rfl:  .  cholecalciferol (VITAMIN D3) 25 MCG (1000 UT) tablet, Take 2,000 Units by mouth daily., Disp: , Rfl:  .  clopidogrel (PLAVIX) 75 MG tablet, Take 1 tablet (75 mg total) by mouth daily with breakfast. (Patient taking differently: Take 75 mg by mouth daily with breakfast. Pt stop taking 10 day ago for procedure), Disp: 30 tablet, Rfl: 6 .  Cyanocobalamin (B-12 COMPLIANCE INJECTION) 1000 MCG/ML KIT, Inject 1,000 mcg as directed every 30 (thirty) days., Disp: , Rfl:  .  cyclobenzaprine (FLEXERIL) 10 MG tablet, Take 1 tablet (10 mg total) by mouth 2 (two) times daily., Disp: 60 tablet, Rfl: 3 .  DEXILANT 60 MG capsule, TAKE ONE CAPSULE BY MOUTH DAILY, Disp: 30 capsule, Rfl: 11 .  DULoxetine (CYMBALTA) 60 MG capsule, Take 1 capsule (60 mg total) by mouth daily., Disp: 90 capsule, Rfl: 0 .  ezetimibe (ZETIA) 10 MG tablet, Take 1 tablet (10 mg total) by mouth daily., Disp: 90 tablet, Rfl: 3 .  fluticasone furoate-vilanterol (BREO ELLIPTA) 200-25 MCG/INH AEPB, Inhale 1 puff into the lungs daily., Disp: 3 each, Rfl: 1 .  furosemide (LASIX) 20 MG tablet, Take 20 mg by mouth every other day., Disp: , Rfl:  .  gabapentin (NEURONTIN) 300 MG capsule, Take 1 capsule (300 mg total) by mouth 3 (three) times daily., Disp: 90 capsule, Rfl: 1 .  gabapentin (NEURONTIN) 800 MG tablet, Take 800 mg by mouth at bedtime., Disp: , Rfl:  .  insulin lispro (HUMALOG KWIKPEN) 100 UNIT/ML KiwkPen, Inject 7-8 Units into the skin 3 (three) times daily. Reported on 12/02/2015/ sliding scale 1 unit for every 8 units of carbs; and 1 unit for every 20 above 120, Disp: , Rfl:  .  Insulin Pen Needle (FIFTY50 PEN NEEDLES) 32G X 4 MM MISC, Inject 1 each into the skin as needed., Disp: , Rfl:  .  LANTUS SOLOSTAR 100 UNIT/ML Solostar Pen, Inject 25-30 Units into the skin daily at  10 pm. , Disp: , Rfl: 0 .  levocetirizine (XYZAL) 5 MG tablet, TAKE 1 TABLET(5 MG) BY MOUTH EVERY EVENING, Disp: 30 tablet, Rfl: 3 .  levothyroxine (SYNTHROID) 25 MCG tablet, Take 1 tablet (25 mcg total) by mouth daily before breakfast., Disp: 90 tablet, Rfl: 0 .  liraglutide (VICTOZA) 18 MG/3ML SOPN, 1.8 units every morning (Patient taking differently: Inject 1.8 mg into the skin daily. ), Disp: , Rfl:  .  loratadine (CLARITIN) 10 MG tablet, Take 10 mg by mouth daily. , Disp: , Rfl:  .  losartan (COZAAR) 25 MG tablet, Take 1 tablet (25 mg total) by mouth daily., Disp: 30 tablet, Rfl: 5 .  metFORMIN (GLUCOPHAGE) 1000 MG tablet, Take 1,000 mg by mouth 2 (two) times daily with a meal., Disp: , Rfl:  .  metoprolol tartrate (LOPRESSOR) 50 MG tablet, Take 1 tablet (50 mg total) by mouth 2 (two) times daily., Disp: 30 tablet, Rfl: 5 .  montelukast (SINGULAIR) 10 MG tablet, Take 10 mg by mouth at bedtime., Disp: , Rfl:  .  Multiple Vitamins-Minerals (MENS MULTI VITAMIN & MINERAL PO), Take 1 tablet by mouth daily., Disp: , Rfl:  .  naloxone (NARCAN) nasal spray 4 mg/0.1 mL, For excess sedation from opioids, Disp: 1 kit, Rfl: 2 .  nitroGLYCERIN (NITROSTAT) 0.3 MG SL tablet, Place 1 tablet (0.3 mg total) under the tongue every 5 (five) minutes as needed for chest pain. Maximum 3 doses., Disp: 25 tablet, Rfl: 3 .  [START ON 12/16/2019] Oxycodone HCl 10 MG TABS, Take 1 tablet (10 mg total) by mouth 4 (four) times daily., Disp: 120 tablet, Rfl: 0 .  [START ON 01/15/2020] Oxycodone HCl 10 MG TABS, Take 1 tablet (10 mg total) by mouth 4 (four) times daily., Disp: 120 tablet, Rfl: 0 .  pyridostigmine (MESTINON) 60 MG tablet, Take 1 tablet by mouth 3 (three) times daily., Disp: , Rfl:  .  rosuvastatin (CRESTOR) 5 MG tablet, Take 5 mg by mouth daily., Disp: , Rfl: 0 .  tamsulosin (FLOMAX) 0.4 MG CAPS capsule, 0.8 mg daily, Disp: 60 capsule, Rfl: 2  Assessment and Plan: 1. Lumbar spondylosis with myelopathy   2. DDD  (degenerative disc disease), lumbar   3. Chronic pain syndrome   4. Chronic, continuous use of opioids   5. 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 Facet arthritis of lumbosacral region   7. Cervicalgia   8. Sciatica, left side   9. Left arm weakness   10. Myasthenia gravis ([place])   11. Left leg weakness   Based on our discussion today upon review of the [facility] practitioner database information going to refill his oxycodone for March 13 April 12.  In the meantime I am going to schedule him for an epidural in 1 month.  Based on the MRI findings of both his neck and back and on his symptomatic weakness I  am going to refer him to Dr. Elayne Guerin for neurosurgical evaluation to determine if he is a surgical candidate.  Furthermore I am going to send him over Dr. Gerald Stabs Cruzan for physical therapy assistance to see if this can help with some of the spasming.  We will schedule him for 1 month return and have encouraged him to continue follow-up with his primary care physicians for his baseline medical care.  Follow Up Instructions:    I discussed the assessment and treatment plan with the patient. The patient was provided an opportunity to ask questions and all were answered. The patient agreed with the plan and demonstrated an understanding of the instructions.   The patient was advised to call back or seek an in-person evaluation if the symptoms worsen or if the condition fails to improve as anticipated.  I provided 30 minutes of non-face-to-face time during this encounter.   Molli Barrows, MD

## 2019-12-04 ENCOUNTER — Other Ambulatory Visit: Payer: Self-pay | Admitting: Family Medicine

## 2019-12-04 ENCOUNTER — Telehealth: Payer: Self-pay

## 2019-12-04 DIAGNOSIS — G7000 Myasthenia gravis without (acute) exacerbation: Secondary | ICD-10-CM

## 2019-12-04 DIAGNOSIS — M542 Cervicalgia: Secondary | ICD-10-CM

## 2019-12-04 DIAGNOSIS — G8929 Other chronic pain: Secondary | ICD-10-CM

## 2019-12-04 NOTE — Telephone Encounter (Signed)
Called patient no answer. LVM in regards to physical therapy appointment, location and for what disability. Waiting on call back.

## 2019-12-04 NOTE — Telephone Encounter (Signed)
Copied from [place] [id number]. Topic: [employer] and exercise psychologist Patient (Clinic Use ONLY) >> Dec 04, 2019  9:56 AM Chilton Greathouse, CMA wrote: Reason for CRM: Referral:  Called patient no answer. LVM in regards to physical therapy appointment, location and for what disability. Waiting on call back. >> Dec 04, 2019 10:38 AM Greggory Keen D wrote: Pt returned call.  Dariah Mcsorley was in a room with a pt. Per Melissa.  Please call pt back.   He has congestive heart failure, diabetes, vision problems, lyme disease, chronic pain in back and neck. He has chronic arthritis in his neck. He wants to see Physical Therapist in [place]. He gets disability from his job and he is required to see a PT once a year. [facility] in [place].

## 2019-12-05 NOTE — Addendum Note (Signed)
Addended by: Inda Coke on: 12/05/2019 02:18 PM   Modules accepted: Orders

## 2019-12-05 NOTE — Telephone Encounter (Signed)
Pt called back saying he need a FCE eval which PT in [place] states that he needs a referral for that particular eval.  Pt's CB# [phone]

## 2019-12-06 ENCOUNTER — Ambulatory Visit: Payer: Medicare Other | Admitting: Physical Therapy

## 2019-12-11 ENCOUNTER — Other Ambulatory Visit: Payer: Self-pay | Admitting: Cardiovascular Disease

## 2019-12-20 DIAGNOSIS — E1165 Type 2 diabetes mellitus with hyperglycemia: Secondary | ICD-10-CM | POA: Diagnosis not present

## 2019-12-20 DIAGNOSIS — Z794 Long term (current) use of insulin: Secondary | ICD-10-CM | POA: Diagnosis not present

## 2019-12-22 ENCOUNTER — Ambulatory Visit: Payer: Medicare Other | Attending: Internal Medicine

## 2019-12-22 ENCOUNTER — Telehealth: Payer: Self-pay | Admitting: *Deleted

## 2019-12-22 DIAGNOSIS — Z23 Encounter for immunization: Secondary | ICD-10-CM

## 2019-12-22 NOTE — Telephone Encounter (Signed)
LM for patient stating that it was unknown at this point the effects of receiveing a steroid within 2 weeks of covid vaccine.  Informed him that some doctors say it is OK and some would like patients to wait the 2 weeks.  Unable to reach Dr [name] for his preference.  Informed patient that it would be his decision as to how he would like to proceed.  Instructed patient to call us back to discuss further if he would like.

## 2019-12-22 NOTE — Progress Notes (Signed)
   Covid-19 Vaccination Clinic  Name:  [name]    MRN: [id number] DOB: 1961/06/01  12/22/2019  [name] was observed post Covid-19 immunization for 15 minutes without incident. He was provided with Vaccine Information Sheet and instruction to access the V-Safe system.   [name] was instructed to call 911 with any severe reactions post vaccine: [name] Difficulty breathing  . Swelling of face and throat  . A fast heartbeat  . A bad rash all over body  . Dizziness and weakness   Immunizations Administered    Name Date Dose VIS Date Route   Pfizer COVID-19 Vaccine 12/22/2019  1:40 PM 0.3 mL 09/15/2019 Intramuscular   Manufacturer: [place]   Lot: [id number]   [place]: [id number]

## 2019-12-27 ENCOUNTER — Telehealth: Payer: Self-pay

## 2019-12-27 ENCOUNTER — Other Ambulatory Visit: Payer: Self-pay | Admitting: Family Medicine

## 2019-12-27 ENCOUNTER — Ambulatory Visit: Payer: Medicare Other | Admitting: Anesthesiology

## 2019-12-27 DIAGNOSIS — G7000 Myasthenia gravis without (acute) exacerbation: Secondary | ICD-10-CM

## 2019-12-27 NOTE — Telephone Encounter (Signed)
According to their notes, they tried to contact him on 12/06/2019 around 9:46am.  I can send Jorje Guild a message to see if she can reach back out to him or I he can give them a call at [phone].

## 2019-12-27 NOTE — Telephone Encounter (Signed)
Copied from [id number]. Topic: Referral - Request for Referral >> Dec 26, 2019  3:37 PM [name] wrote: Has patient seen PCP for this complaint? Yes *If NO, is insurance requiring patient see PCP for this issue before PCP can refer them? Referral for which specialty: FCE Evaluation Preferred provider/office: [facility] Reason for referral: FCE Evaluation for Long Term Disability (Stated this is the specific reason that needs to be noted on referral)  Pt is requesting an urgent referral. Stated he has been trying to get this done for a month. Please contact pt with questions.

## 2019-12-27 NOTE — Telephone Encounter (Signed)
Will do!

## 2020-01-02 ENCOUNTER — Ambulatory Visit: Payer: Medicare Other | Admitting: Anesthesiology

## 2020-01-05 ENCOUNTER — Other Ambulatory Visit: Payer: Self-pay | Admitting: Family Medicine

## 2020-01-05 DIAGNOSIS — F411 Generalized anxiety disorder: Secondary | ICD-10-CM

## 2020-01-05 DIAGNOSIS — E063 Autoimmune thyroiditis: Secondary | ICD-10-CM

## 2020-01-05 DIAGNOSIS — F331 Major depressive disorder, recurrent, moderate: Secondary | ICD-10-CM

## 2020-01-08 ENCOUNTER — Ambulatory Visit: Payer: Medicare Other | Admitting: Anesthesiology

## 2020-01-08 ENCOUNTER — Encounter: Payer: Self-pay | Admitting: Family Medicine

## 2020-01-09 ENCOUNTER — Ambulatory Visit: Payer: Medicare Other | Attending: Anesthesiology | Admitting: Anesthesiology

## 2020-01-09 ENCOUNTER — Other Ambulatory Visit: Payer: Self-pay

## 2020-01-09 ENCOUNTER — Ambulatory Visit: Payer: Medicare Other | Admitting: Anesthesiology

## 2020-01-09 ENCOUNTER — Encounter: Payer: Self-pay | Admitting: Anesthesiology

## 2020-01-09 DIAGNOSIS — M47817 Spondylosis without myelopathy or radiculopathy, lumbosacral region: Secondary | ICD-10-CM

## 2020-01-09 DIAGNOSIS — G894 Chronic pain syndrome: Secondary | ICD-10-CM | POA: Diagnosis not present

## 2020-01-09 DIAGNOSIS — M542 Cervicalgia: Secondary | ICD-10-CM

## 2020-01-09 DIAGNOSIS — M5136 Other intervertebral disc degeneration, lumbar region: Secondary | ICD-10-CM

## 2020-01-09 DIAGNOSIS — F1190 Opioid use, unspecified, uncomplicated: Secondary | ICD-10-CM | POA: Diagnosis not present

## 2020-01-09 DIAGNOSIS — A6923 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: Secondary | ICD-10-CM | POA: Diagnosis not present

## 2020-01-09 DIAGNOSIS — M4716 Other spondylosis with myelopathy, lumbar region: Secondary | ICD-10-CM | POA: Diagnosis not present

## 2020-01-09 DIAGNOSIS — G7000 Myasthenia gravis without (acute) exacerbation: Secondary | ICD-10-CM

## 2020-01-09 MED ORDER — OXYCODONE HCL 10 MG PO TABS: 10.0000 mg | ORAL_TABLET | Freq: Four times a day (QID) | ORAL | 0 refills | Status: DC

## 2020-01-09 NOTE — Progress Notes (Signed)
Virtual Visit via Telephone Note  I connected with [name] on 01/09/20 at 12:30 PM EDT by telephone and verified that I am speaking with the correct person using two identifiers.  Location: Patient: Home Provider: Pain control center   I discussed the limitations, risks, security and privacy concerns of performing an evaluation and management service by telephone and the availability of in person appointments. I also discussed with the patient that there may be a patient responsible charge related to this service. The patient expressed understanding and agreed to proceed.   History of Present Illness: I spoke with [name] via telephone as he was unable to do the video portion of the virtual conference today.  Unfortunately he had to defer on his facet block secondary to caring for his wife who has a GI bug.  They were unable to get a ride for him today.  He reports that he is having considerable amount of left side facet pain and would like to reschedule and get a therapeutic lumbar facet block within the next month or so.  These generally give him excellent relief lasting 2 to 3 months at 75% improvement.  He is also having some cervical pain in the trapezius muscles that we have done injections for the past 2.  This is mainly a spasming pain but not associated with any upper extremity weakness.  He is taking his medications as prescribed and continues to get good relief with these with no evidence of any diverting or illicit use and no side effects are reported.  They enable him to sleep better and function better throughout the day and these help keep him active he reports.    Observations/Objective:  Current Outpatient Medications:  .  albuterol (PROVENTIL HFA;VENTOLIN HFA) 108 (90 Base) MCG/ACT inhaler, Inhale 2 puffs into the lungs every 6 (six) hours as needed for wheezing or shortness of breath., Disp: , Rfl:  .  albuterol (PROVENTIL) (2.5 MG/3ML) 0.083% nebulizer solution, Take  2.5 mg by nebulization every 6 (six) hours as needed for wheezing or shortness of breath., Disp: , Rfl:  .  aspirin EC 81 MG tablet, Take 1 tablet (81 mg total) by mouth daily., Disp: 90 tablet, Rfl: 3 .  betamethasone valerate ointment (VALISONE) 0.1 %, Apply topically., Disp: , Rfl:  .  cholecalciferol (VITAMIN D3) 25 MCG (1000 UT) tablet, Take 2,000 Units by mouth daily., Disp: , Rfl:  .  clopidogrel (PLAVIX) 75 MG tablet, Take 1 tablet (75 mg total) by mouth daily with breakfast. (Patient taking differently: Take 75 mg by mouth daily with breakfast. Pt stop taking 10 day ago for procedure), Disp: 30 tablet, Rfl: 6 .  Cyanocobalamin (B-12 COMPLIANCE INJECTION) 1000 MCG/ML KIT, Inject 1,000 mcg as directed every 30 (thirty) days., Disp: , Rfl:  .  DEXILANT 60 MG capsule, TAKE ONE CAPSULE BY MOUTH DAILY, Disp: 30 capsule, Rfl: 11 .  DULoxetine (CYMBALTA) 60 MG capsule, TAKE 1 CAPSULE(60 MG) BY MOUTH DAILY, Disp: 90 capsule, Rfl: 0 .  ezetimibe (ZETIA) 10 MG tablet, Take 1 tablet (10 mg total) by mouth daily., Disp: 90 tablet, Rfl: 3 .  fluticasone furoate-vilanterol (BREO ELLIPTA) 200-25 MCG/INH AEPB, Inhale 1 puff into the lungs daily., Disp: 3 each, Rfl: 1 .  furosemide (LASIX) 20 MG tablet, Take 20 mg by mouth every other day., Disp: , Rfl:  .  gabapentin (NEURONTIN) 300 MG capsule, Take 1 capsule (300 mg total) by mouth 3 (three) times daily., Disp: 90 capsule, Rfl:  1 .  gabapentin (NEURONTIN) 800 MG tablet, Take 800 mg by mouth at bedtime., Disp: , Rfl:  .  insulin lispro (HUMALOG KWIKPEN) 100 UNIT/ML KiwkPen, Inject 7-8 Units into the skin 3 (three) times daily. Reported on 12/02/2015/ sliding scale 1 unit for every 8 units of carbs; and 1 unit for every 20 above 120, Disp: , Rfl:  .  Insulin Pen Needle (FIFTY50 PEN NEEDLES) 32G X 4 MM MISC, Inject 1 each into the skin as needed., Disp: , Rfl:  .  LANTUS SOLOSTAR 100 UNIT/ML Solostar Pen, Inject 25-30 Units into the skin daily at 10 pm. , Disp:  , Rfl: 0 .  levocetirizine (XYZAL) 5 MG tablet, TAKE 1 TABLET(5 MG) BY MOUTH EVERY EVENING, Disp: 30 tablet, Rfl: 3 .  levothyroxine (SYNTHROID) 25 MCG tablet, TAKE 1 TABLET(25 MCG) BY MOUTH DAILY BEFORE BREAKFAST, Disp: 90 tablet, Rfl: 0 .  liraglutide (VICTOZA) 18 MG/3ML SOPN, 1.8 units every morning (Patient taking differently: Inject 1.8 mg into the skin daily. ), Disp: , Rfl:  .  loratadine (CLARITIN) 10 MG tablet, Take 10 mg by mouth daily. , Disp: , Rfl:  .  losartan (COZAAR) 25 MG tablet, TAKE 1 TABLET(25 MG) BY MOUTH DAILY, Disp: 30 tablet, Rfl: 5 .  metFORMIN (GLUCOPHAGE) 1000 MG tablet, Take 1,000 mg by mouth 2 (two) times daily with a meal., Disp: , Rfl:  .  metoprolol tartrate (LOPRESSOR) 50 MG tablet, Take 1 tablet (50 mg total) by mouth 2 (two) times daily., Disp: 30 tablet, Rfl: 5 .  montelukast (SINGULAIR) 10 MG tablet, Take 10 mg by mouth at bedtime., Disp: , Rfl:  .  Multiple Vitamins-Minerals (MENS MULTI VITAMIN & MINERAL PO), Take 1 tablet by mouth daily., Disp: , Rfl:  .  naloxone (NARCAN) nasal spray 4 mg/0.1 mL, For excess sedation from opioids, Disp: 1 kit, Rfl: 2 .  nitroGLYCERIN (NITROSTAT) 0.3 MG SL tablet, Place 1 tablet (0.3 mg total) under the tongue every 5 (five) minutes as needed for chest pain. Maximum 3 doses., Disp: 25 tablet, Rfl: 3 .  [START ON 01/14/2020] Oxycodone HCl 10 MG TABS, Take 1 tablet (10 mg total) by mouth 4 (four) times daily., Disp: 120 tablet, Rfl: 0 .  [START ON 02/13/2020] Oxycodone HCl 10 MG TABS, Take 1 tablet (10 mg total) by mouth 4 (four) times daily., Disp: 120 tablet, Rfl: 0 .  pyridostigmine (MESTINON) 60 MG tablet, Take 1 tablet by mouth 3 (three) times daily., Disp: , Rfl:  .  rosuvastatin (CRESTOR) 5 MG tablet, Take 5 mg by mouth daily., Disp: , Rfl: 0 .  tamsulosin (FLOMAX) 0.4 MG CAPS capsule, 0.8 mg daily, Disp: 60 capsule, Rfl: 2  Assessment and Plan: 1. Lumbar spondylosis with myelopathy   2. DDD (degenerative disc disease),  lumbar   3. Chronic pain syndrome   4. Chronic, continuous use of opioids   5. 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 Facet arthritis of lumbosacral region   7. Cervicalgia   8. Myasthenia gravis [facility])   Based on our discussion today and upon review of the [place] practitioner database information going to refill his medications for the next 2 months.  These will be dated for 411 and 511.  I have him return to clinic in 1 month for a left-sided therapeutic lumbar facet block.  1 would continue with the stretching strengthening exercises as reviewed today on the phone and continue medication management as reviewed today.  He is also  to continue follow-up with his primary care physician for his baseline medical care.  Follow Up Instructions:    I discussed the assessment and treatment plan with the patient. The patient was provided an opportunity to ask questions and all were answered. The patient agreed with the plan and demonstrated an understanding of the instructions.   The patient was advised to call back or seek an in-person evaluation if the symptoms worsen or if the condition fails to improve as anticipated.  I provided 30 minutes of non-face-to-face time during this encounter.   Molli Barrows, MD

## 2020-01-16 ENCOUNTER — Ambulatory Visit: Payer: Medicare Other | Attending: Internal Medicine

## 2020-01-16 ENCOUNTER — Ambulatory Visit: Payer: Medicare Other | Admitting: Cardiovascular Disease

## 2020-01-16 DIAGNOSIS — Z23 Encounter for immunization: Secondary | ICD-10-CM

## 2020-01-16 NOTE — Progress Notes (Signed)
   Covid-19 Vaccination Clinic  Name:  [name]    MRN: [id number] DOB: 07/03/1961  01/16/2020  [name] was observed post Covid-19 immunization for 15 minutes without incident. He was provided with Vaccine Information Sheet and instruction to access the V-Safe system.   [name] was instructed to call 911 with any severe reactions post vaccine: [name] Difficulty breathing  . Swelling of face and throat  . A fast heartbeat  . A bad rash all over body  . Dizziness and weakness   Immunizations Administered    Name Date Dose VIS Date Route   Pfizer COVID-19 Vaccine 01/16/2020  2:41 PM 0.3 mL 09/15/2019 Intramuscular   Manufacturer: [place]   Lot: [id number]   [place]: [id number]

## 2020-01-17 ENCOUNTER — Ambulatory Visit: Payer: Medicare Other | Admitting: Anesthesiology

## 2020-01-22 ENCOUNTER — Other Ambulatory Visit: Payer: Self-pay | Admitting: Family Medicine

## 2020-01-22 DIAGNOSIS — E063 Autoimmune thyroiditis: Secondary | ICD-10-CM

## 2020-01-25 ENCOUNTER — Other Ambulatory Visit: Payer: Self-pay | Admitting: Cardiovascular Disease

## 2020-01-26 ENCOUNTER — Other Ambulatory Visit: Payer: Self-pay

## 2020-01-26 ENCOUNTER — Encounter: Payer: Self-pay | Admitting: Physical Therapy

## 2020-01-26 ENCOUNTER — Ambulatory Visit: Payer: Medicare Other | Attending: Family Medicine | Admitting: Physical Therapy

## 2020-01-26 DIAGNOSIS — R531 Weakness: Secondary | ICD-10-CM | POA: Insufficient documentation

## 2020-01-26 DIAGNOSIS — G8929 Other chronic pain: Secondary | ICD-10-CM | POA: Diagnosis not present

## 2020-01-26 DIAGNOSIS — R269 Unspecified abnormalities of gait and mobility: Secondary | ICD-10-CM | POA: Insufficient documentation

## 2020-01-26 DIAGNOSIS — M25561 Pain in right knee: Secondary | ICD-10-CM | POA: Diagnosis not present

## 2020-01-26 DIAGNOSIS — M5442 Lumbago with sciatica, left side: Secondary | ICD-10-CM | POA: Insufficient documentation

## 2020-01-26 DIAGNOSIS — M25562 Pain in left knee: Secondary | ICD-10-CM | POA: Diagnosis not present

## 2020-01-26 DIAGNOSIS — M542 Cervicalgia: Secondary | ICD-10-CM | POA: Diagnosis not present

## 2020-01-26 NOTE — Therapy (Signed)
[facility] [address] [place], [place], [address] Phone: [phone]   Fax:  [phone]  Physical Therapy Evaluation  Patient Details  Name: KALIM KISSEL MRN: [id number] Date of Birth: [birth year] Referring Provider (PT): Dr. Ancil Boozer   Encounter Date: 01/26/2020  PT End of Session - 01/26/20 2015    Visit Number  1    Number of Visits  1    Date for PT Re-Evaluation  01/27/20    PT Start Time  0942    PT Stop Time  1101    PT Time Calculation (min)  79 min    Equipment Utilized During Treatment  Gait belt    Activity Tolerance  Patient limited by pain;Patient limited by fatigue    Behavior During Therapy  [facility] for tasks assessed/performed       Past Medical History:  Diagnosis Date  . Allergy    dust, seasonal (worse in the fall).  . 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 Followed by Pain Specialist in [place], back and neck  . Asthma    BRONCHITIS  . Cataract    First Dx in 2012  . Chronic combined systolic and diastolic congestive heart failure ([place])    a. 03/2018 Echo: EF 30-35%, ant, antlat, apical AK, Gr1 DD; b. 07/2018 Echo: EF 35-40%, anteroseptal, apical, and ant HK. Gr1 DD; c. 09/2019 TEE: EF 40-45%.  . Coronary artery disease    a. Prior Ant MI->s/p multiple stents placed in the LAD and right coronary artery ([place]); b. 2016 Cath: reportedly nonobs dzs;  c. 04/2018 Cath/PCI: LM nl, LAD 20p, patent mid stent, LCX 49m3.25x15 Sierra DES), OM1 nl, OM2 50, OM3 40 w/ patent stent, RCA 40p, 230m40d w/ patent stent in RPDA, RPAV 60, EF 25-35%. 2+MR; d. 02/2019 MV: Apical scar, no isch, EF 30-44%.  . Diabetes mellitus without complication ([place]   TYPE 2  . Diabetic peripheral neuropathy (HCC)    feet and hands  . FUO (fever of unknown origin) 08/03/2018  . GERD (gastroesophageal reflux disease)   . Headache    muscle tension  . Hyperlipidemia   . Hypertension    CONTROLLED ON MEDS  . Insomnia   . Ischemic cardiomyopathy     a. 03/2018 Echo: EF 30-35%, ant, antlat, apical AK, Gr1 DD, mild MR, mildly dil LA; b. 07/2018 Echo: EF 35-40%; c. 09/2018 TEE: EF 40-45%, antsept, ant HK.  . Marland Kitcheneft arm weakness 10/04/2019  . Left leg weakness 12/01/2019  . Lyme disease    Chronic  . Myasthenia gravis (HCH[place]  . Myocardial infarction (HC[place]2010  . Seasonal allergies   . Sepsis ([place]   a.07/2018 - unknown source. TEE neg for veg 09/2019.  . Marland Kitchenleep apnea    CPAP    Past Surgical History:  Procedure Laterality Date  . BILATERAL CARPAL TUNNEL RELEASE Bilateral L in 2012 and R in 2013  . CARDIAC CATHETERIZATION     Several Caths, most recent in  March 2016.  . Marland KitchenOLONOSCOPY WITH PROPOFOL N/A 01/10/2016   Procedure: COLONOSCOPY WITH PROPOFOL;  Surgeon: DaLucilla LameMD;  Location: [facility] ENDOSCOPY;  Service: Endoscopy;  Laterality: N/A;  . CORONARY ANGIOPLASTY    . CORONARY STENT INTERVENTION N/A 04/25/2018   Procedure: CORONARY STENT INTERVENTION;  Surgeon: ArWellington HampshireMD;  [facility];  Service: Cardiovascular;  Laterality: N/A;  . ESOPHAGOGASTRODUODENOSCOPY (EGD) WITH PROPOFOL N/A 01/10/2016   Procedure: ESOPHAGOGASTRODUODENOSCOPY (EGD) WITH  PROPOFOL;  Surgeon: [name], MD;  Location: [facility] ENDOSCOPY;  Service: Endoscopy;  Laterality: N/A;  . EYE SURGERY Bilateral 2012   cataract/bilateral vitrectomies  . LEFT HEART CATH AND CORONARY ANGIOGRAPHY Left 04/25/2018   Procedure: LEFT HEART CATH AND CORONARY ANGIOGRAPHY;  Surgeon: [name], MD;  Location: [place] [facility] LAB;  Service: Cardiovascular;  Laterality: Left;  . TEE WITHOUT CARDIOVERSION N/A 09/05/2018   Procedure: TRANSESOPHAGEAL ECHOCARDIOGRAM (TEE);  Surgeon: [name], MD;  Location: [facility] ORS;  Service: Cardiovascular;  Laterality: N/A;  . TONSILLECTOMY AND ADENOIDECTOMY     As a child  . TUNNELED VENOUS CATHETER PLACEMENT     removed    There were no vitals filed for this visit.   Subjective Assessment - 01/26/20 2012     Subjective  See FCE report    Patient Stated Goals  FCE only    Currently in Pain?  Yes    Pain Score  4     Pain Location  Back    Pain Orientation  Left    Pain Type  Chronic pain    Pain Score  6    Pain Location  Neck    Pain Orientation  Right;Left    Pain Type  Chronic pain         [facility] PT Assessment - 01/26/20 0001      Assessment   Medical Diagnosis  Myastenia Gravis/ Chronic low back pain with radiculopathy    Referring Provider (PT)  Dr. Ancil Boozer    Onset Date/Surgical Date  03/05/92    Hand Dominance  Left    Prior Therapy  yes      Prior Function   Level of Independence  Independent with community mobility with device               Plan - 01/26/20 2016    Clinical Impression Statement  Overall Level of Work: Falls within the Sedentary range.  Exerting up to 10 pounds of force occasionally (Occasionally: activity or condition exist up to 1/3 or the time) and/or a negligible amount of force frequently (Frequently: activity or condition exist from 1/3 to 2/3 or the time) to lift, carry, push, pull, or otherwise move objects, including the human body.  Sedentary work involves sitting most of the time, but may involve walking or standing for brief periods of time.  Jobs are sedentary if walking and standing are required only occasionally and all other sedentary criteria are met.    Please see the Task Performance Table for specific abilities.  Tolerance for the 8-Hour Day: Due to the client's limited ability to sit, he would have to alternate between sitting and other tasks as listed in the task performance table to tolerate the Sedentary level of work for the 8-hour day/40-hour week.  Due to the limited sitting tolerance, however, the client will need to alternate among other tasks listed in the task performance table to maximize work tolerance to the 8-hour day.    Stability/Clinical Decision Making  Stable/Uncomplicated    Clinical Decision Making  Low    Rehab  Potential  Fair    PT Frequency  One time visit    PT Treatment/Interventions  ADLs/Self Care Home Management;Balance training;Therapeutic activities;Functional mobility training;Therapeutic exercise;Gait training;Stair training;Neuromuscular re-education    PT Next Visit Plan  FCE only       Patient will benefit from skilled therapeutic intervention in order to improve the following deficits and impairments:  Abnormal gait, Pain, Improper  body mechanics, Impaired sensation, Postural dysfunction, Decreased mobility, Decreased activity tolerance, Decreased endurance, Decreased range of motion, Decreased strength, Hypomobility, Impaired flexibility, Difficulty walking, Decreased balance  Visit Diagnosis: Weakness generalized  Gait difficulty  Chronic left-sided low back pain with left-sided sciatica  Cervicalgia  Chronic pain of left knee  Chronic pain of right knee     Problem List Patient Active Problem List   Diagnosis Date Noted  . Left leg weakness 12/01/2019  . Left arm weakness 10/04/2019  . Lower urinary tract symptoms 09/15/2019  . Incomplete bladder emptying 09/15/2019  . Carpal tunnel syndrome on both sides 07/12/2019  . Weakness generalized 06/21/2019  . Idiopathic peripheral neuropathy 04/21/2019  . Sensory ataxia 03/09/2019  . Colitis 07/08/2018  . OSA on CPAP 07/08/2018  . Congestive heart failure (CHF) ([place]) 05/05/2018  . Unstable angina ([place]) 04/26/2018  . Ischemic cardiomyopathy 04/26/2018  . Chronic combined systolic and diastolic CHF (congestive heart failure) ([place]) 04/26/2018  . Effort angina ([place]) 04/25/2018  . 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[place]) 01/25/2018  . Hyperlipidemia due to type 2 diabetes mellitus ([place]) 08/12/2017  . Vitamin D deficiency, unspecified 08/12/2017  . Ptosis of left eyelid 01/08/2017  . Dermatitis 12/24/2016  . Difficulty walking 04/27/2016  . Foot cramps 04/27/2016  . Morbid (severe) obesity due to excess  calories ([place]) 04/06/2016  . Benign neoplasm of sigmoid colon   . Benign neoplasm of descending colon   . Benign neoplasm of transverse colon   . Coronary artery disease involving native coronary artery with angina pectoris ([place]) 10/22/2015  . Coronary artery disease 08/22/2015  . DM (diabetes mellitus), type 2, uncontrolled with complications ([place]) 93/81/0175  . Myasthenia gravis ([place]) 08/22/2015  . Chronic left-sided low back pain 08/22/2015  . Hypertension 08/22/2015  . GERD (gastroesophageal reflux disease) 08/22/2015  . Arthritis 08/22/2015  . Diabetic peripheral neuropathy ([place]) 08/22/2015  . Hyperlipidemia 08/22/2015  . Insomnia 08/22/2015  . Type 2 diabetes mellitus with diabetic polyneuropathy, with long-term current use of insulin ([place]) 08/22/2015  . Asthma 08/22/2015  . Lyme disease 06/05/1992   Pura Spice, PT, DPT # [phone] 01/26/2020, 8:20 PM  [facility] [address] [place], [place], [address] Phone: [phone]   Fax:  [phone]  Name: DANYAEL ALIPIO MRN: [id number] Date of Birth: 22-Nov-1960

## 2020-01-27 DIAGNOSIS — Z794 Long term (current) use of insulin: Secondary | ICD-10-CM | POA: Diagnosis not present

## 2020-01-27 DIAGNOSIS — E1165 Type 2 diabetes mellitus with hyperglycemia: Secondary | ICD-10-CM | POA: Diagnosis not present

## 2020-01-30 ENCOUNTER — Telehealth: Payer: Self-pay

## 2020-01-30 NOTE — Telephone Encounter (Signed)
Spoke with patient and let him know we received his FCE Programmer, systems) from [place] .  Patient has an upcoming appointment on Friday 02/02/2020 and will need his disability paperwork from [place] filled out during his visit with what was found durin his evaluation. Paperwork will be in the green folder on her back desk for his appointment and assisting Dr. Ancil Boozer filling out his forms.

## 2020-02-01 ENCOUNTER — Ambulatory Visit: Payer: Medicare Other | Admitting: Cardiovascular Disease

## 2020-02-01 ENCOUNTER — Other Ambulatory Visit: Payer: Self-pay

## 2020-02-01 ENCOUNTER — Encounter: Payer: Self-pay | Admitting: Cardiovascular Disease

## 2020-02-01 VITALS — BP 120/78 | HR 100 | Ht 67.0 in | Wt 251.2 lb

## 2020-02-01 DIAGNOSIS — M79604 Pain in right leg: Secondary | ICD-10-CM

## 2020-02-01 DIAGNOSIS — M79605 Pain in left leg: Secondary | ICD-10-CM | POA: Diagnosis not present

## 2020-02-01 DIAGNOSIS — I2510 Atherosclerotic heart disease of native coronary artery without angina pectoris: Secondary | ICD-10-CM | POA: Diagnosis not present

## 2020-02-01 DIAGNOSIS — I10 Essential (primary) hypertension: Secondary | ICD-10-CM | POA: Diagnosis not present

## 2020-02-01 DIAGNOSIS — I5042 Chronic combined systolic (congestive) and diastolic (congestive) heart failure: Secondary | ICD-10-CM

## 2020-02-01 MED ORDER — METOPROLOL SUCCINATE ER 100 MG PO TB24
100.0000 mg | ORAL_TABLET | Freq: Every day | ORAL | 3 refills | Status: DC
Start: 2020-02-01 — End: 2020-10-31

## 2020-02-01 NOTE — Progress Notes (Signed)
Cardiology Office Note   Date:  02/01/2020   ID:  [name], DOB 1960-10-26, MRN [id number]  PCP:  Edgar Sizer, MD  Cardiologist: [name], MD   Chief Complaint  Patient presents with  . OTHER    4 month f/u c/o muscle cramping, weight gain and would like to discuss holding plavix for 5 days for inj.. Meds reviewed verbally with pt.       History of Present Illness: [name] is a 59 y.o. male who presents for a follow-up visit regarding coronary artery disease and chronic systolic heart failure.    He has extensive cardiac history. He had a total of 10 stents placed (in LAD and RCA) starting in 2009 after he presented with myocardial infarction.  He has other chronic medical conditions that include diabetes, hypertension, hyperlipidemia,myasthenia gravis,  generalized pain due to reported Lyme disease and obesity. He has history of hyperlipidemia with intolerance to high-dose statins.  He had worsening heart failure in June of 2019.  Echocardiogram showed worsening LV systolic function with an EF of 30% with akinesis of the anterior, anterolateral and apical myocardium.  There was mild mitral regurgitation. Cardiac catheterization in July, 2019 showed patent stents in the LAD, OM's and right coronary artery.  There was a new mid left circumflex stenosis which was stented.  EF was 25 to 30%.  LVEDP was severely elevated.  The patient was started on furosemide.  Entresto was added. He had recurrent sepsis and fever of unknown origin starting in October 2019 with elevated troponin.  Repeat catheterization was not performed.  TEE was performed which showed no evidence of endocarditis.  EF was 40 to 45%.  He ultimately did not tolerate Entresto due to hypotension and dizziness and was switched back to losartan.  In addition, he had persistent tachycardia and dizziness with carvedilol and we elected to switch him to metoprolol.  He has been doing reasonably well but he  admits to not eating healthy and has gained some weight.  That has been associated with some increase in exertional dyspnea but no chest discomfort.  In addition, he has been having cramping in both legs with walking and at rest.  He does have known history of low back pain and spine issues.  No prior history of peripheral arterial disease.    Past Medical History:  Diagnosis Date  . Allergy    dust, seasonal (worse in the fall).  . 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 Followed by Pain Specialist in [place], back and neck  . Asthma    BRONCHITIS  . Cataract    First Dx in 2012  . Chronic combined systolic and diastolic congestive heart failure ([place])    a. 03/2018 Echo: EF 30-35%, ant, antlat, apical AK, Gr1 DD; b. 07/2018 Echo: EF 35-40%, anteroseptal, apical, and ant HK. Gr1 DD; c. 09/2019 TEE: EF 40-45%.  . Coronary artery disease    a. Prior Ant MI->s/p multiple stents placed in the LAD and right coronary artery ([place]); b. 2016 Cath: reportedly nonobs dzs;  c. 04/2018 Cath/PCI: LM nl, LAD 20p, patent mid stent, LCX 68m3.25x15 Sierra DES), OM1 nl, OM2 50, OM3 40 w/ patent stent, RCA 40p, 274m40d w/ patent stent in RPDA, RPAV 60, EF 25-35%. 2+MR; d. 02/2019 MV: Apical scar, no isch, EF 30-44%.  . Diabetes mellitus without complication ([place]   TYPE 2  . Diabetic peripheral neuropathy (HCC)    feet and hands  .  FUO (fever of unknown origin) 08/03/2018  . GERD (gastroesophageal reflux disease)   . Headache    muscle tension  . Hyperlipidemia   . Hypertension    CONTROLLED ON MEDS  . Insomnia   . Ischemic cardiomyopathy    a. 03/2018 Echo: EF 30-35%, ant, antlat, apical AK, Gr1 DD, mild MR, mildly dil LA; b. 07/2018 Echo: EF 35-40%; c. 09/2018 TEE: EF 40-45%, antsept, ant HK.  [name] Left arm weakness 10/04/2019  . Left leg weakness 12/01/2019  . Lyme disease    Chronic  . Myasthenia gravis ([place])   . Myocardial infarction ([place]) 2010  . Seasonal allergies   . Sepsis ([place])    a.07/2018 - unknown  source. TEE neg for veg 09/2019.  [name] Sleep apnea    CPAP    Past Surgical History:  Procedure Laterality Date  . BILATERAL CARPAL TUNNEL RELEASE Bilateral L in 2012 and R in 2013  . CARDIAC CATHETERIZATION     Several Caths, most recent in  March 2016.  [name] COLONOSCOPY WITH PROPOFOL N/A 01/10/2016   Procedure: COLONOSCOPY WITH PROPOFOL;  Surgeon: [name], MD;  Location: [facility] ENDOSCOPY;  Service: Endoscopy;  Laterality: N/A;  . CORONARY ANGIOPLASTY    . CORONARY STENT INTERVENTION N/A 04/25/2018   Procedure: CORONARY STENT INTERVENTION;  Surgeon: [name], MD;  Location: [place] CV LAB;  Service: Cardiovascular;  Laterality: N/A;  . ESOPHAGOGASTRODUODENOSCOPY (EGD) WITH PROPOFOL N/A 01/10/2016   Procedure: ESOPHAGOGASTRODUODENOSCOPY (EGD) WITH PROPOFOL;  Surgeon: [name], MD;  Location: [facility] ENDOSCOPY;  Service: Endoscopy;  Laterality: N/A;  . EYE SURGERY Bilateral 2012   cataract/bilateral vitrectomies  . LEFT HEART CATH AND CORONARY ANGIOGRAPHY Left 04/25/2018   Procedure: LEFT HEART CATH AND CORONARY ANGIOGRAPHY;  Surgeon: [name], MD;  Location: [place] CV LAB;  Service: Cardiovascular;  Laterality: Left;  . TEE WITHOUT CARDIOVERSION N/A 09/05/2018   Procedure: TRANSESOPHAGEAL ECHOCARDIOGRAM (TEE);  Surgeon: [name], MD;  Location: [facility] ORS;  Service: Cardiovascular;  Laterality: N/A;  . TONSILLECTOMY AND ADENOIDECTOMY     As a child  . TUNNELED VENOUS CATHETER PLACEMENT     removed     Current Outpatient Medications  Medication Sig Dispense Refill  . albuterol (PROVENTIL HFA;VENTOLIN HFA) 108 (90 Base) MCG/ACT inhaler Inhale 2 puffs into the lungs every 6 (six) hours as needed for wheezing or shortness of breath.    [name] albuterol (PROVENTIL) (2.5 MG/3ML) 0.083% nebulizer solution Take 2.5 mg by nebulization every 6 (six) hours as needed for wheezing or shortness of breath.    [name] aspirin EC 81 MG tablet Take 1 tablet (81 mg total) by mouth  daily. 90 tablet 3  . betamethasone valerate ointment (VALISONE) 0.1 % Apply topically.    . cholecalciferol (VITAMIN D3) 25 MCG (1000 UT) tablet Take 2,000 Units by mouth daily.    . clopidogrel (PLAVIX) 75 MG tablet Take 1 tablet (75 mg total) by mouth daily with breakfast. (Patient taking differently: Take 75 mg by mouth daily with breakfast. Pt stop taking 10 day ago for procedure) 30 tablet 6  . Cyanocobalamin (B-12 COMPLIANCE INJECTION) 1000 MCG/ML KIT Inject 1,000 mcg as directed every 30 (thirty) days.    [name] DEXILANT 60 MG capsule TAKE ONE CAPSULE BY MOUTH DAILY 30 capsule 11  . DULoxetine (CYMBALTA) 60 MG capsule TAKE 1 CAPSULE(60 MG) BY MOUTH DAILY 90 capsule 0  . ezetimibe (ZETIA) 10 MG tablet Take 1 tablet (10 mg total) by mouth daily. [place]  tablet 3  . fluticasone furoate-vilanterol (BREO ELLIPTA) 200-25 MCG/INH AEPB Inhale 1 puff into the lungs daily. 3 each 1  . furosemide (LASIX) 20 MG tablet Take 20 mg by mouth every other day.    . gabapentin (NEURONTIN) 300 MG capsule Take 1 capsule (300 mg total) by mouth 3 (three) times daily. 90 capsule 1  . gabapentin (NEURONTIN) 800 MG tablet Take 800 mg by mouth at bedtime.    . insulin lispro (HUMALOG KWIKPEN) 100 UNIT/ML KiwkPen Inject 7-8 Units into the skin 3 (three) times daily. Reported on 12/02/2015/ sliding scale 1 unit for every 8 units of carbs; and 1 unit for every 20 above 120    . Insulin Pen Needle (FIFTY50 PEN NEEDLES) 32G X 4 MM MISC Inject 1 each into the skin as needed.    [name] LANTUS SOLOSTAR 100 UNIT/ML Solostar Pen Inject 25-30 Units into the skin daily at 10 pm.   0  . levocetirizine (XYZAL) 5 MG tablet TAKE 1 TABLET(5 MG) BY MOUTH EVERY EVENING 30 tablet 3  . levothyroxine (SYNTHROID) 25 MCG tablet TAKE 1 TABLET(25 MCG) BY MOUTH DAILY BEFORE BREAKFAST 90 tablet 0  . liraglutide (VICTOZA) 18 MG/3ML SOPN 1.8 units every morning (Patient taking differently: Inject 1.8 mg into the skin daily. )    . loratadine (CLARITIN) 10 MG  tablet Take 10 mg by mouth daily.     [name] losartan (COZAAR) 25 MG tablet TAKE 1 TABLET(25 MG) BY MOUTH DAILY 30 tablet 5  . metFORMIN (GLUCOPHAGE) 1000 MG tablet Take 1,000 mg by mouth 2 (two) times daily with a meal.    . metoprolol tartrate (LOPRESSOR) 50 MG tablet TAKE 1 TABLET(50 MG) BY MOUTH TWICE DAILY 30 tablet 5  . montelukast (SINGULAIR) 10 MG tablet Take 10 mg by mouth at bedtime.    . Multiple Vitamins-Minerals (MENS MULTI VITAMIN & MINERAL PO) Take 1 tablet by mouth daily.    . naloxone (NARCAN) nasal spray 4 mg/0.1 mL For excess sedation from opioids 1 kit 2  . nitroGLYCERIN (NITROSTAT) 0.3 MG SL tablet Place 1 tablet (0.3 mg total) under the tongue every 5 (five) minutes as needed for chest pain. Maximum 3 doses. 25 tablet 3  . Oxycodone HCl 10 MG TABS Take 1 tablet (10 mg total) by mouth 4 (four) times daily. 120 tablet 0  . [START ON 02/13/2020] Oxycodone HCl 10 MG TABS Take 1 tablet (10 mg total) by mouth 4 (four) times daily. 120 tablet 0  . pyridostigmine (MESTINON) 60 MG tablet Take 1 tablet by mouth 3 (three) times daily.    . rosuvastatin (CRESTOR) 5 MG tablet Take 5 mg by mouth daily.  0  . tamsulosin (FLOMAX) 0.4 MG CAPS capsule 0.8 mg daily 60 capsule 2   No current facility-administered medications for this visit.    Allergies:   Azathioprine and Novolog [insulin aspart]    Social History:  The patient  reports that he has never smoked. He has never used smokeless tobacco. He reports previous alcohol use. He reports that he does not use drugs.   Family History:  The patient's family history includes Cancer in his father; Dementia in his father; Diabetes in his brother and mother; Healthy in his brother and brother; Heart disease in his mother.    ROS:  Please see the history of present illness.   Otherwise, review of systems are positive for none.   All other systems are reviewed and negative.    PHYSICAL EXAM: VS:  BP 120/78 (BP Location: Left Arm, Patient  Position: Sitting, Cuff Size: Large)   Pulse 100   Ht '5\' 7"'$  (1.702 m)   Wt 251 lb 4 oz (114 kg)   SpO2 98%   BMI 39.35 kg/m  , BMI Body mass index is 39.35 kg/m. GEN: Well nourished, well developed, in no acute distress  HEENT: normal  Neck: no JVD, carotid bruits, or masses  Cardiac: RRR; no murmurs, rubs, or gallops, trace bilateral leg edema Respiratory:  clear to auscultation bilaterally, normal work of breathing GI: soft, nontender, nondistended, + BS MS: no deformity or atrophy  Skin: warm and dry, no rash Neuro:  Strength and sensation are intact Psych: euthymic mood, full affect Distal pulses are palpable.  EKG:  EKG is ordered today. The ekg ordered today demonstrates normal sinus rhythm with old anterolateral infarct and possible inferior infarct.   Recent Labs: 02/03/2019: Hemoglobin 11.5; Platelets 178 08/07/2019: BNP 84.9 10/16/2019: ALT 18; BUN 33; Creat 1.64; Potassium 5.0; Sodium 140; TSH 3.00    Lipid Panel    Component Value Date/Time   CHOL 86 10/16/2019 1424   CHOL 113 01/17/2018 0819   TRIG 126 10/16/2019 1424   HDL 31 (L) 10/16/2019 1424   HDL 33 (L) 01/17/2018 0819   CHOLHDL 2.8 10/16/2019 1424   VLDL 26 11/29/2017 1154   LDLCALC 34 10/16/2019 1424      Wt Readings from Last 3 Encounters:  02/01/20 251 lb 4 oz (114 kg)  10/20/19 245 lb (111.1 kg)  10/16/19 245 lb 4.8 oz (111.3 kg)        ASSESSMENT AND PLAN:  1. Coronary artery disease with stable angina: He is doing well overall with controlled symptoms.  Some worsening of exertional dyspnea is likely related to physical deconditioning and some weight gain.  2.  Chronic systolic heart failure due to ischemic cardiomyopathy: Most recent ejection fraction was 40 to 45%.  Continue metoprolol and losartan.  He appears to be euvolemic on small dose furosemide every other day.   I elected to switch metoprolol to Toprol 100 mg once daily.  3.  Essential hypertension: Blood pressure is  controlled on current medications.  4.  Hyperlipidemia: Tolerating rosuvastatin and Zetia.  Most recent lipid profile showed an LDL of 34  5.  Type 2 diabetes mellitus: A1c 7.8 and she followed closely by primary care.Consider SGLT2 inhibitor in the future.  Given heart failure, treatment with [place]ade should be considered if no interaction with his other diabetic medications.  6.  Bilateral leg pain: Multiple risk factors for peripheral arterial disease.  I requested lower extremity arterial Doppler.   Disposition:   FU with me in 6 months    Signed, [name], MD 02/01/20 [facility], [place]

## 2020-02-01 NOTE — Patient Instructions (Signed)
Medication Instructions:  Your physician has recommended you make the following change in your medication:   STOP Metoprolol Tartrate  START Metoprolol Succinate 100 mg daily. An Rx has been sent to your pharmacy.  *If you need a refill on your cardiac medications before your next appointment, please call your pharmacy*   Lab Work: None ordered If you have labs (blood work) drawn today and your tests are completely normal, you will receive your results only by: [name] MyChart Message (if you have MyChart) OR . A paper copy in the mail If you have any lab test that is abnormal or we need to change your treatment, we will call you to review the results.   Testing/Procedures: Your physician has requested that you have a lower extremity arterial exercise duplex. During this test, exercise and ultrasound are used to evaluate arterial blood flow in the legs. Allow one hour for this exam. There are no restrictions or special instructions.      Follow-Up: At [facility], you and your health needs are our priority.  As part of our continuing mission to provide you with exceptional heart care, we have created designated Provider Care Teams.  These Care Teams include your primary Cardiologist (physician) and Advanced Practice Providers (APPs -  Physician Assistants and Nurse Practitioners) who all work together to provide you with the care you need, when you need it.  We recommend signing up for the patient portal called "MyChart".  Sign up information is provided on this After Visit Summary.  MyChart is used to connect with patients for Virtual Visits (Telemedicine).  Patients are able to view lab/test results, encounter notes, upcoming appointments, etc.  Non-urgent messages can be sent to your provider as well.   To learn more about what you can do with MyChart, go to [email or web address].    Your next appointment:   6 month(s)  The format for your next appointment:   In  Person  Provider:    You may see [name], MD or one of the following Advanced Practice Providers on your designated Care Team:    Murray Hodgkins, NP  Christell Faith, PA-C  Marrianne Mood, PA-C    Other Instructions N/A

## 2020-02-02 ENCOUNTER — Ambulatory Visit (INDEPENDENT_AMBULATORY_CARE_PROVIDER_SITE_OTHER): Payer: Medicare Other | Admitting: Family Medicine

## 2020-02-02 ENCOUNTER — Encounter: Payer: Self-pay | Admitting: Family Medicine

## 2020-02-02 VITALS — BP 110/68 | HR 87 | Temp 96.8°F | Resp 16 | Ht 67.0 in | Wt 250.5 lb

## 2020-02-02 DIAGNOSIS — I25118 Atherosclerotic heart disease of native coronary artery with other forms of angina pectoris: Secondary | ICD-10-CM | POA: Diagnosis not present

## 2020-02-02 DIAGNOSIS — M5442 Lumbago with sciatica, left side: Secondary | ICD-10-CM

## 2020-02-02 DIAGNOSIS — G7000 Myasthenia gravis without (acute) exacerbation: Secondary | ICD-10-CM | POA: Diagnosis not present

## 2020-02-02 DIAGNOSIS — I200 Unstable angina: Secondary | ICD-10-CM | POA: Diagnosis not present

## 2020-02-02 DIAGNOSIS — I5022 Chronic systolic (congestive) heart failure: Secondary | ICD-10-CM | POA: Diagnosis not present

## 2020-02-02 DIAGNOSIS — G8929 Other chronic pain: Secondary | ICD-10-CM

## 2020-02-02 NOTE — Progress Notes (Signed)
Name: [name]   MRN: [id number]    DOB: 17-Apr-1961   Date:02/02/2020       Progress Note  Subjective  Chief Complaint  Chief Complaint  Patient presents with  . Form Completion    HPI  CAD with angina/CHF/HTN:first episode in 2010 multiple stents since  he had a new stent placed July 2019, last admission . He also also has CHF since 2019 . He sees Dr. Fletcher Anon, last visit was yesterday. He is currently on   metoprolol XL 100 mg , Losartan,  Lasixprn onlyhe is also back on plavix and aspirin. He has orthopnea and head of the bed is about 30 degrees of elevation, he has intermittent chest pain and takes NTG about twice a week to control symptoms. He also has sob with mild activity. He has intermittent lower extremity edema. He no longer drives because of lack of peripheral vision. He uses a cane to assist with balance since he has MG and also chronic low back with radiculitis . He has been on disability since 2010 and will never be able to return to work due to multiple chronic , progressive medical problems.   Patient Active Problem List   Diagnosis Date Noted  . Left leg weakness 12/01/2019  . Left arm weakness 10/04/2019  . Lower urinary tract symptoms 09/15/2019  . Incomplete bladder emptying 09/15/2019  . Carpal tunnel syndrome on both sides 07/12/2019  . Weakness generalized 06/21/2019  . Idiopathic peripheral neuropathy 04/21/2019  . Sensory ataxia 03/09/2019  . Colitis 07/08/2018  . OSA on CPAP 07/08/2018  . Congestive heart failure (CHF) ([place]) 05/05/2018  . Unstable angina ([place]) 04/26/2018  . Ischemic cardiomyopathy 04/26/2018  . Chronic combined systolic and diastolic CHF (congestive heart failure) ([place]) 04/26/2018  . Effort angina ([place]) 04/25/2018  . 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[place]) 01/25/2018  . Hyperlipidemia due to type 2 diabetes mellitus ([place]) 08/12/2017  . Vitamin D deficiency, unspecified 08/12/2017  . Ptosis of left eyelid 01/08/2017   . Dermatitis 12/24/2016  . Difficulty walking 04/27/2016  . Foot cramps 04/27/2016  . Morbid (severe) obesity due to excess calories ([place]) 04/06/2016  . Benign neoplasm of sigmoid colon   . Benign neoplasm of descending colon   . Benign neoplasm of transverse colon   . Coronary artery disease involving native coronary artery with angina pectoris ([place]) 10/22/2015  . Coronary artery disease 08/22/2015  . DM (diabetes mellitus), type 2, uncontrolled with complications ([place]) 87/[id number]/4332  . Myasthenia gravis ([place]) 08/22/2015  . Chronic left-sided low back pain 08/22/2015  . Hypertension 08/22/2015  . GERD (gastroesophageal reflux disease) 08/22/2015  . Arthritis 08/22/2015  . Diabetic peripheral neuropathy ([place]) 08/22/2015  . Hyperlipidemia 08/22/2015  . Insomnia 08/22/2015  . Type 2 diabetes mellitus with diabetic polyneuropathy, with long-term current use of insulin ([place]) 08/22/2015  . Asthma 08/22/2015  . Lyme disease 06/05/1992    Past Surgical History:  Procedure Laterality Date  . BILATERAL CARPAL TUNNEL RELEASE Bilateral L in 2012 and R in 2013  . CARDIAC CATHETERIZATION     Several Caths, most recent in  March 2016.  [name] COLONOSCOPY WITH PROPOFOL N/A 01/10/2016   Procedure: COLONOSCOPY WITH PROPOFOL;  Surgeon: [name], MD;  Location: [facility];  Service: Endoscopy;  Laterality: N/A;  . CORONARY ANGIOPLASTY    . CORONARY STENT INTERVENTION N/A 04/25/2018   Procedure: CORONARY STENT INTERVENTION;  Surgeon: [name], MD;  Location: [facility];  Service: Cardiovascular;  Laterality: N/A;  .  ESOPHAGOGASTRODUODENOSCOPY (EGD) WITH PROPOFOL N/A 01/10/2016   Procedure: ESOPHAGOGASTRODUODENOSCOPY (EGD) WITH PROPOFOL;  Surgeon: [name], MD;  Location: [facility] END[facility];  Service: Endoscopy;  Laterality: N/A;  . EYE SURGERY Bilateral 2012   cataract/bilateral vitrectomies  . LEFT HEART CATH AND CORONARY ANGIOGRAPHY Left 04/25/2018   Procedure: LEFT HEART CATH AND  CORONARY ANGIOGRAPHY;  Surgeon: [name], MD;  Location: [facility];  Service: Cardiovascular;  Laterality: Left;  . TEE WITHOUT CARDIOVERSION N/A 09/05/2018   Procedure: TRANSESOPHAGEAL ECHOCARDIOGRAM (TEE);  Surgeon: [name], MD;  Location: [facility];  Service: Cardiovascular;  Laterality: N/A;  . TONSILLECTOMY AND ADENOIDECTOMY     As a child  . TUNNELED VENOUS CATHETER PLACEMENT     removed    Family History  Problem Relation Age of Onset  . Diabetes Mother   . Heart disease Mother   . Cancer Father        Prostate CA, Anal cancer   . Dementia Father   . Diabetes Brother   . Healthy Brother   . Healthy Brother     Social History   Tobacco Use  . Smoking status: Never Smoker  . Smokeless tobacco: Never Used  . Tobacco comment: smoking cessation materials not required  Substance Use Topics  . Alcohol use: Not Currently    Alcohol/week: 0.0 standard drinks     Current Outpatient Medications:  .  albuterol (PROVENTIL HFA;VENTOLIN HFA) 108 (90 Base) MCG/ACT inhaler, Inhale 2 puffs into the lungs every 6 (six) hours as needed for wheezing or shortness of breath., Disp: , Rfl:  .  albuterol (PROVENTIL) (2.5 MG/3ML) 0.083% nebulizer solution, Take 2.5 mg by nebulization every 6 (six) hours as needed for wheezing or shortness of breath., Disp: , Rfl:  .  aspirin EC 81 MG tablet, Take 1 tablet (81 mg total) by mouth daily., Disp: 90 tablet, Rfl: 3 .  betamethasone valerate ointment (VALISONE) 0.1 %, Apply topically., Disp: , Rfl:  .  cholecalciferol (VITAMIN D3) 25 MCG (1000 UT) tablet, Take 2,000 Units by mouth daily., Disp: , Rfl:  .  clopidogrel (PLAVIX) 75 MG tablet, Take 1 tablet (75 mg total) by mouth daily with breakfast. (Patient taking differently: Take 75 mg by mouth daily with breakfast. Pt stop taking 10 day ago for procedure), Disp: 30 tablet, Rfl: 6 .  Cyanocobalamin (B-12 COMPLIANCE INJECTION) 1000 MCG/ML KIT, Inject 1,000 mcg as directed  every 30 (thirty) days., Disp: , Rfl:  .  DEXILANT 60 MG capsule, TAKE ONE CAPSULE BY MOUTH DAILY, Disp: 30 capsule, Rfl: 11 .  DULoxetine (CYMBALTA) 60 MG capsule, TAKE 1 CAPSULE(60 MG) BY MOUTH DAILY, Disp: 90 capsule, Rfl: 0 .  fluticasone furoate-vilanterol (BREO ELLIPTA) 200-25 MCG/INH AEPB, Inhale 1 puff into the lungs daily., Disp: 3 each, Rfl: 1 .  furosemide (LASIX) 20 MG tablet, Take 20 mg by mouth every other day., Disp: , Rfl:  .  gabapentin (NEURONTIN) 300 MG capsule, Take 1 capsule (300 mg total) by mouth 3 (three) times daily., Disp: 90 capsule, Rfl: 1 .  gabapentin (NEURONTIN) 800 MG tablet, Take 800 mg by mouth at bedtime., Disp: , Rfl:  .  insulin lispro (HUMALOG KWIKPEN) 100 UNIT/ML KiwkPen, Inject 7-8 Units into the skin 3 (three) times daily. Reported on 12/02/2015/ sliding scale 1 unit for every 8 units of carbs; and 1 unit for every 20 above 120, Disp: , Rfl:  .  Insulin Pen Needle (FIFTY50 PEN NEEDLES) 32G X 4 MM MISC, Inject  1 each into the skin as needed., Disp: , Rfl:  .  LANTUS SOLOSTAR 100 UNIT/ML Solostar Pen, Inject 25-30 Units into the skin daily at 10 pm. , Disp: , Rfl: 0 .  levocetirizine (XYZAL) 5 MG tablet, TAKE 1 TABLET(5 MG) BY MOUTH EVERY EVENING, Disp: 30 tablet, Rfl: 3 .  levothyroxine (SYNTHROID) 25 MCG tablet, TAKE 1 TABLET(25 MCG) BY MOUTH DAILY BEFORE BREAKFAST, Disp: 90 tablet, Rfl: 0 .  liraglutide (VICTOZA) 18 MG/3ML SOPN, 1.8 units every morning (Patient taking differently: Inject 1.8 mg into the skin daily. ), Disp: , Rfl:  .  loratadine (CLARITIN) 10 MG tablet, Take 10 mg by mouth daily. , Disp: , Rfl:  .  losartan (COZAAR) 25 MG tablet, TAKE 1 TABLET(25 MG) BY MOUTH DAILY, Disp: 30 tablet, Rfl: 5 .  metFORMIN (GLUCOPHAGE) 1000 MG tablet, Take 1,000 mg by mouth 2 (two) times daily with a meal., Disp: , Rfl:  .  metoprolol succinate (TOPROL-XL) 100 MG 24 hr tablet, Take 1 tablet (100 mg total) by mouth daily. Take with or immediately following a meal.,  Disp: 90 tablet, Rfl: 3 .  montelukast (SINGULAIR) 10 MG tablet, Take 10 mg by mouth at bedtime., Disp: , Rfl:  .  Multiple Vitamins-Minerals (MENS MULTI VITAMIN & MINERAL PO), Take 1 tablet by mouth daily., Disp: , Rfl:  .  naloxone (NARCAN) nasal spray 4 mg/0.1 mL, For excess sedation from opioids, Disp: 1 kit, Rfl: 2 .  nitroGLYCERIN (NITROSTAT) 0.3 MG SL tablet, Place 1 tablet (0.3 mg total) under the tongue every 5 (five) minutes as needed for chest pain. Maximum 3 doses., Disp: 25 tablet, Rfl: 3 .  Oxycodone HCl 10 MG TABS, Take 1 tablet (10 mg total) by mouth 4 (four) times daily., Disp: 120 tablet, Rfl: 0 .  [START ON 02/13/2020] Oxycodone HCl 10 MG TABS, Take 1 tablet (10 mg total) by mouth 4 (four) times daily., Disp: 120 tablet, Rfl: 0 .  pyridostigmine (MESTINON) 60 MG tablet, Take 1 tablet by mouth 3 (three) times daily., Disp: , Rfl:  .  rosuvastatin (CRESTOR) 5 MG tablet, Take 5 mg by mouth daily., Disp: , Rfl: 0 .  tamsulosin (FLOMAX) 0.4 MG CAPS capsule, 0.8 mg daily, Disp: 60 capsule, Rfl: 2 .  ezetimibe (ZETIA) 10 MG tablet, Take 1 tablet (10 mg total) by mouth daily., Disp: 90 tablet, Rfl: 3  Allergies  Allergen Reactions  . Azathioprine Other (See Comments)    Azathioprine hypersensitivity reaction  . Novolog [Insulin Aspart] Hives    I personally reviewed active problem list, medication list, allergies, family history, social history with the patient/caregiver today.   ROS  Constitutional: Negative for fever or weight change.  Respiratory: Positive  for cough and shortness of breath.   Cardiovascular: Positive  for intermittent chest pain but no palpitations.  Gastrointestinal: Negative for abdominal pain, no bowel changes.  Musculoskeletal: Positive  for gait problem ( uses a cane)  No  joint swelling.  Skin: Negative for rash.  Neurological: positive  For intermittent  dizziness and headache.  No other specific complaints in a complete review of systems (except  as listed in HPI above).  Objective  Vitals:   02/02/20 1008  BP: 110/68  Pulse: 87  Resp: 16  Temp: (!) 96.8 F (36 C)  TempSrc: Temporal  SpO2: 96%  Weight: 250 lb 8 oz (113.6 kg)  Height: 5' 7" (1.702 m)    Body mass index is 39.23 kg/m.  Physical Exam  Constitutional: Patient appears well-developed and well-nourished. Obese  No distress.  HEENT: head atraumatic, normocephalic, pupils equal and reactive to light Cardiovascular: Normal rate, regular rhythm and normal heart sounds.  No murmur heard. Trace  BLE edema. Pulmonary/Chest: [place] normal and breath sounds normal. No respiratory distress. Abdominal: Soft.  There is no tenderness. Muscular skeletal: using a cane, pain , pain during palpation of lumbar spine  Psychiatric: Patient has a normal mood and affect. behavior is normal. Judgment and thought content normal.  PHQ2/9: Depression screen [facility] 2/9 02/02/2020 10/16/2019 07/21/2019 07/18/2019 06/14/2019  Decreased Interest _0 Down, Depressed, Hopeless _1 PHQ - 2 Score _2 Altered sleeping _3 Tired, decreased energy _4 Change in appetite _5 Feeling bad or failure about yourself  0 0 0 1 1  Trouble concentrating _6 0  Moving slowly or fidgety/restless 0 0 _7 Suicidal thoughts 0 0 0 0 0  PHQ-9 Score _8 Difficult doing work/chores _9   Some recent data might be hidden    phq 9 is positive  Fall Risk: Fall Risk  02/02/2020 10/16/2019 08/16/2019 07/18/2019 06/14/2019  Falls in the past year? 0 0 1 0 0  Comment - - - - -  Number falls in past yr: 0 0 0 0 0  Comment - - Pt slipped went down on knee, twisted his back, increased pain, heat helped - -  Injury with Fall? 0 0 - 0 0  Risk Factor Category  - - - - -  Risk for fall due to : - - - - -  Risk for fall due to: Comment - - - - -  Follow up - - - Falls  prevention discussed -    Functional Status Survey: Is the patient deaf or have difficulty hearing?: Yes Does the patient have difficulty seeing, even when wearing glasses/contacts?: No Does the patient have difficulty concentrating, remembering, or making decisions?: No Does the patient have difficulty walking or climbing stairs?: Yes Does the patient have difficulty dressing or bathing?: No Does the patient have difficulty doing errands alone such as visiting a doctor's office or shopping?: No    Assessment & Plan  1. Chronic systolic CHF (congestive heart failure) ([place])  He has multiple medical problems and even though original disability was secondary to CAD and CHF since that time he has developed Myasthenia Gravis , peripheral vision loss and chronic back pain. He will be permanently disabled.   2. Unstable angina ([facility])  Stable  3. Coronary artery disease of native artery of native heart with stable angina pectoris ([place])  History of stent placement, on medication, symptoms stable and up to date with follow ups with cardiologist   4. Myasthenia gravis ([place])   5. Chronic left-sided low back pain with left-sided sciatica  Using a cane

## 2020-02-05 ENCOUNTER — Other Ambulatory Visit: Payer: Self-pay

## 2020-02-05 MED ORDER — CLOPIDOGREL BISULFATE 75 MG PO TABS: 75.0000 mg | ORAL_TABLET | Freq: Every day | ORAL | 5 refills | Status: DC

## 2020-02-12 ENCOUNTER — Other Ambulatory Visit: Payer: Self-pay

## 2020-02-12 ENCOUNTER — Other Ambulatory Visit: Payer: Self-pay | Admitting: Gastroenterology

## 2020-02-12 ENCOUNTER — Ambulatory Visit
Admission: RE | Admit: 2020-02-12 | Discharge: 2020-02-12 | Disposition: A | Discharge status: Home / Self Care | Payer: Medicare Other | Source: Ambulatory Visit | Attending: Anesthesiology | Admitting: Anesthesiology

## 2020-02-12 ENCOUNTER — Encounter: Payer: Self-pay | Admitting: Anesthesiology

## 2020-02-12 ENCOUNTER — Ambulatory Visit (HOSPITAL_BASED_OUTPATIENT_CLINIC_OR_DEPARTMENT_OTHER): Payer: Medicare Other | Admitting: Anesthesiology

## 2020-02-12 ENCOUNTER — Other Ambulatory Visit: Payer: Self-pay | Admitting: Anesthesiology

## 2020-02-12 VITALS — BP 147/92 | HR 97 | Temp 97.2°F | Resp 16 | Ht 67.0 in | Wt 250.0 lb

## 2020-02-12 DIAGNOSIS — M5136 Other intervertebral disc degeneration, lumbar region: Secondary | ICD-10-CM

## 2020-02-12 DIAGNOSIS — M4716 Other spondylosis with myelopathy, lumbar region: Secondary | ICD-10-CM

## 2020-02-12 DIAGNOSIS — G894 Chronic pain syndrome: Secondary | ICD-10-CM

## 2020-02-12 DIAGNOSIS — M5432 Sciatica, left side: Secondary | ICD-10-CM

## 2020-02-12 DIAGNOSIS — M47817 Spondylosis without myelopathy or radiculopathy, lumbosacral region: Secondary | ICD-10-CM

## 2020-02-12 DIAGNOSIS — G7000 Myasthenia gravis without (acute) exacerbation: Secondary | ICD-10-CM

## 2020-02-12 DIAGNOSIS — R52 Pain, unspecified: Secondary | ICD-10-CM

## 2020-02-12 DIAGNOSIS — M542 Cervicalgia: Secondary | ICD-10-CM

## 2020-02-12 DIAGNOSIS — A6923 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: Secondary | ICD-10-CM

## 2020-02-12 DIAGNOSIS — F1190 Opioid use, unspecified, uncomplicated: Secondary | ICD-10-CM

## 2020-02-12 DIAGNOSIS — R29898 Other symptoms and signs involving the musculoskeletal system: Secondary | ICD-10-CM

## 2020-02-12 MED ORDER — DEXILANT 60 MG PO CPDR: 1.0000 | DELAYED_RELEASE_CAPSULE | Freq: Every day | ORAL | 11 refills | Status: DC

## 2020-02-12 MED ORDER — OXYCODONE HCL 10 MG PO TABS: 10.0000 mg | ORAL_TABLET | Freq: Four times a day (QID) | ORAL | 0 refills | Status: DC

## 2020-02-12 MED ORDER — OXYCODONE HCL 10 MG PO TABS: 10.0000 mg | ORAL_TABLET | Freq: Four times a day (QID) | ORAL | 0 refills | Status: AC

## 2020-02-12 NOTE — Telephone Encounter (Signed)
Refill for Dexilant 60mg  has been sent to pt's pharmacy.

## 2020-02-12 NOTE — Progress Notes (Signed)
Nursing Pain Medication Assessment:  Safety precautions to be maintained throughout the outpatient stay will include: orient to surroundings, keep bed in low position, maintain call bell within reach at all times, provide assistance with transfer out of bed and ambulation.  Medication Inspection Compliance: Pill count conducted under aseptic conditions, in front of the patient. Neither the pills nor the bottle was removed from the patient's sight at any time. Once count was completed pills were immediately returned to the patient in their original bottle.  Medication: Oxycodone IR Pill/Patch Count: 0 of 120 pills remain Pill/Patch Appearance: Markings consistent with prescribed medication Bottle Appearance: Standard pharmacy container. Clearly labeled. Filled Date: 04/07 / 2021 Last Medication intake:  [place] Mylar

## 2020-02-12 NOTE — Telephone Encounter (Signed)
Dexilant 60 mg  30 day refill  [facility] in [place]

## 2020-02-13 ENCOUNTER — Telehealth: Payer: Self-pay | Admitting: *Deleted

## 2020-02-13 NOTE — Telephone Encounter (Signed)
Called patient to reschedule his procedure appointment from yesterday. LVM for him to return call to reschedule.

## 2020-02-20 ENCOUNTER — Other Ambulatory Visit: Payer: Self-pay

## 2020-02-20 ENCOUNTER — Other Ambulatory Visit: Payer: Self-pay | Admitting: Anesthesiology

## 2020-02-20 ENCOUNTER — Encounter: Payer: Self-pay | Admitting: Anesthesiology

## 2020-02-20 ENCOUNTER — Ambulatory Visit (HOSPITAL_BASED_OUTPATIENT_CLINIC_OR_DEPARTMENT_OTHER): Payer: Medicare Other | Admitting: Anesthesiology

## 2020-02-20 ENCOUNTER — Ambulatory Visit
Admission: RE | Admit: 2020-02-20 | Discharge: 2020-02-20 | Disposition: A | Discharge status: Home / Self Care | Payer: Medicare Other | Source: Ambulatory Visit | Attending: Anesthesiology | Admitting: Anesthesiology

## 2020-02-20 VITALS — BP 131/87 | HR 85 | Temp 97.3°F | Resp 15 | Ht 67.0 in | Wt 245.0 lb

## 2020-02-20 DIAGNOSIS — M542 Cervicalgia: Secondary | ICD-10-CM

## 2020-02-20 DIAGNOSIS — R52 Pain, unspecified: Secondary | ICD-10-CM | POA: Diagnosis not present

## 2020-02-20 DIAGNOSIS — M5432 Sciatica, left side: Secondary | ICD-10-CM

## 2020-02-20 DIAGNOSIS — M5136 Other intervertebral disc degeneration, lumbar region: Secondary | ICD-10-CM

## 2020-02-20 DIAGNOSIS — G894 Chronic pain syndrome: Secondary | ICD-10-CM

## 2020-02-20 DIAGNOSIS — F1190 Opioid use, unspecified, uncomplicated: Secondary | ICD-10-CM | POA: Diagnosis present

## 2020-02-20 DIAGNOSIS — A6923 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: Secondary | ICD-10-CM | POA: Diagnosis present

## 2020-02-20 DIAGNOSIS — R29898 Other symptoms and signs involving the musculoskeletal system: Secondary | ICD-10-CM

## 2020-02-20 DIAGNOSIS — M4716 Other spondylosis with myelopathy, lumbar region: Secondary | ICD-10-CM | POA: Diagnosis not present

## 2020-02-20 MED ORDER — DEXAMETHASONE SODIUM PHOSPHATE 10 MG/ML IJ SOLN
10.0000 mg | Freq: Once | INTRAMUSCULAR | Status: AC
  Administered 2020-02-20: 10 mg

## 2020-02-20 MED ORDER — SODIUM CHLORIDE (PF) 0.9 % IJ SOLN
INTRAMUSCULAR | Status: AC
  Filled 2020-02-20: qty 10

## 2020-02-20 MED ORDER — IOHEXOL 180 MG/ML  SOLN
10.0000 mL | Freq: Once | INTRAMUSCULAR | Status: AC | PRN
  Administered 2020-02-20: 10 mL via EPIDURAL

## 2020-02-20 MED ORDER — CYCLOBENZAPRINE HCL 10 MG PO TABS: 10.0000 mg | ORAL_TABLET | Freq: Two times a day (BID) | ORAL | 2 refills | Status: DC | PRN

## 2020-02-20 MED ORDER — TRIAMCINOLONE ACETONIDE 40 MG/ML IJ SUSP
INTRAMUSCULAR | Status: AC
  Filled 2020-02-20: qty 1

## 2020-02-20 MED ORDER — LIDOCAINE HCL (PF) 1 % IJ SOLN
5.0000 mL | Freq: Once | INTRAMUSCULAR | Status: AC
  Administered 2020-02-20: 5 mL via SUBCUTANEOUS

## 2020-02-20 MED ORDER — ROPIVACAINE HCL 2 MG/ML IJ SOLN
INTRAMUSCULAR | Status: AC
  Filled 2020-02-20: qty 10

## 2020-02-20 MED ORDER — DEXAMETHASONE SODIUM PHOSPHATE 10 MG/ML IJ SOLN
INTRAMUSCULAR | Status: AC
  Filled 2020-02-20: qty 1

## 2020-02-20 MED ORDER — ROPIVACAINE HCL 2 MG/ML IJ SOLN
10.0000 mL | Freq: Once | INTRAMUSCULAR | Status: AC
  Administered 2020-02-20: 10 mL via EPIDURAL

## 2020-02-20 MED ORDER — OXYCODONE HCL 10 MG PO TABS: 10.0000 mg | ORAL_TABLET | Freq: Four times a day (QID) | ORAL | 0 refills | Status: AC

## 2020-02-20 MED ORDER — TRIAMCINOLONE ACETONIDE 40 MG/ML IJ SUSP
40.0000 mg | Freq: Once | INTRAMUSCULAR | Status: AC
  Administered 2020-02-20: 40 mg

## 2020-02-20 MED ORDER — SODIUM CHLORIDE 0.9% FLUSH
10.0000 mL | Freq: Once | INTRAVENOUS | Status: AC
  Administered 2020-02-20: 10 mL

## 2020-02-20 MED ORDER — IOHEXOL 180 MG/ML  SOLN
INTRAMUSCULAR | Status: AC
  Filled 2020-02-20: qty 20

## 2020-02-20 MED ORDER — LIDOCAINE HCL (PF) 1 % IJ SOLN
INTRAMUSCULAR | Status: AC
  Filled 2020-02-20: qty 5

## 2020-02-20 NOTE — Patient Instructions (Signed)

## 2020-02-20 NOTE — Progress Notes (Signed)
Nursing Pain Medication Assessment:  Safety precautions to be maintained throughout the outpatient stay will include: orient to surroundings, keep bed in low position, maintain call bell within reach at all times, provide assistance with transfer out of bed and ambulation.  Medication Inspection Compliance: Pill count conducted under aseptic conditions, in front of the patient. Neither the pills nor the bottle was removed from the patient's sight at any time. Once count was completed pills were immediately returned to the patient in their original bottle.  Medication: Oxycodone IR Pill/Patch Count: 93 of 120 pills remain Pill/Patch Appearance: Markings consistent with prescribed medication Bottle Appearance: Standard pharmacy container. Clearly labeled. Filled Date: 05 / 07 / 2021 Last Medication intake:  Today

## 2020-02-20 NOTE — Progress Notes (Signed)
Subjective:  Patient ID: [name], male    DOB: Aug 21, 1961  Age: 59 y.o. MRN: [id number]  CC: Back Pain (low) and Neck Pain   Procedure: L5-S1 epidural steroid under fluoroscopic guidance with no sedation and left side splenius capitis trigger point injection x1  HPI [name] presents for reevaluation.  He was last seen virtually a few months ago and has had considerably more left side low back pain with radiation into the left posterior thigh and left calf.  He has had this in the past and it has responded favorably to epidural steroid injections.  Unfortunately he has failed conservative therapy with medication management and physical therapy stretching exercises.  He presents today requesting an epidural steroid injection for the low back pain.  He generally gets 75% relief lasting about 2 to 3 months before he has recurrence of a similar pain.  He is also reporting a left side posterior neck pain with a spasm.  He has been using his medications as prescribed and the opioid medications give him good relief but it is transient.  It is more successful than anti-inflammatories or more conservative care.  He denies any side effects and continues to derive good functional lifestyle provement with the opioid medications.  Outpatient Medications Prior to Visit  Medication Sig Dispense Refill  . albuterol (PROVENTIL HFA;VENTOLIN HFA) 108 (90 Base) MCG/ACT inhaler Inhale 2 puffs into the lungs every 6 (six) hours as needed for wheezing or shortness of breath.    [name] albuterol (PROVENTIL) (2.5 MG/3ML) 0.083% nebulizer solution Take 2.5 mg by nebulization every 6 (six) hours as needed for wheezing or shortness of breath.    [name] aspirin EC 81 MG tablet Take 1 tablet (81 mg total) by mouth daily. 90 tablet 3  . betamethasone valerate ointment (VALISONE) 0.1 % Apply topically.    . cholecalciferol (VITAMIN D3) 25 MCG (1000 UT) tablet Take 2,000 Units by mouth daily.    . clopidogrel (PLAVIX) 75  MG tablet Take 1 tablet (75 mg total) by mouth daily with breakfast. 30 tablet 5  . Cyanocobalamin (B-12 COMPLIANCE INJECTION) 1000 MCG/ML KIT Inject 1,000 mcg as directed every 30 (thirty) days.    [name] dexlansoprazole (DEXILANT) 60 MG capsule Take 1 capsule (60 mg total) by mouth daily. 30 capsule 11  . DULoxetine (CYMBALTA) 60 MG capsule TAKE 1 CAPSULE(60 MG) BY MOUTH DAILY 90 capsule 0  . ezetimibe (ZETIA) 10 MG tablet Take 1 tablet (10 mg total) by mouth daily. 90 tablet 3  . fluticasone furoate-vilanterol (BREO ELLIPTA) 200-25 MCG/INH AEPB Inhale 1 puff into the lungs daily. 3 each 1  . furosemide (LASIX) 20 MG tablet Take 20 mg by mouth every other day.    . gabapentin (NEURONTIN) 300 MG capsule Take 1 capsule (300 mg total) by mouth 3 (three) times daily. 90 capsule 1  . gabapentin (NEURONTIN) 800 MG tablet Take 800 mg by mouth at bedtime.    . insulin lispro (HUMALOG KWIKPEN) 100 UNIT/ML KiwkPen Inject 7-8 Units into the skin 3 (three) times daily. Reported on 12/02/2015/ sliding scale 1 unit for every 8 units of carbs; and 1 unit for every 20 above 120    . Insulin Pen Needle (FIFTY50 PEN NEEDLES) 32G X 4 MM MISC Inject 1 each into the skin as needed.    [name] LANTUS SOLOSTAR 100 UNIT/ML Solostar Pen Inject 25-30 Units into the skin daily at 10 pm.   0  . levocetirizine (XYZAL) 5 MG tablet  TAKE 1 TABLET(5 MG) BY MOUTH EVERY EVENING 30 tablet 3  . levothyroxine (SYNTHROID) 25 MCG tablet TAKE 1 TABLET(25 MCG) BY MOUTH DAILY BEFORE BREAKFAST 90 tablet 0  . liraglutide (VICTOZA) 18 MG/3ML SOPN 1.8 units every morning (Patient taking differently: Inject 1.8 mg into the skin daily. )    . loratadine (CLARITIN) 10 MG tablet Take 10 mg by mouth daily.     [name] losartan (COZAAR) 25 MG tablet TAKE 1 TABLET(25 MG) BY MOUTH DAILY 30 tablet 5  . metFORMIN (GLUCOPHAGE) 1000 MG tablet Take 1,000 mg by mouth 2 (two) times daily with a meal.    . metoprolol succinate (TOPROL-XL) 100 MG 24 hr tablet Take 1 tablet  (100 mg total) by mouth daily. Take with or immediately following a meal. 90 tablet 3  . montelukast (SINGULAIR) 10 MG tablet Take 10 mg by mouth at bedtime.    . Multiple Vitamins-Minerals (MENS MULTI VITAMIN & MINERAL PO) Take 1 tablet by mouth daily.    . naloxone (NARCAN) nasal spray 4 mg/0.1 mL For excess sedation from opioids 1 kit 2  . nitroGLYCERIN (NITROSTAT) 0.3 MG SL tablet Place 1 tablet (0.3 mg total) under the tongue every 5 (five) minutes as needed for chest pain. Maximum 3 doses. 25 tablet 3  . [START ON 03/13/2020] Oxycodone HCl 10 MG TABS Take 1 tablet (10 mg total) by mouth 4 (four) times daily. 120 tablet 0  . pyridostigmine (MESTINON) 60 MG tablet Take 1 tablet by mouth 3 (three) times daily.    . rosuvastatin (CRESTOR) 5 MG tablet Take 5 mg by mouth daily.  0  . cyclobenzaprine (FLEXERIL) 10 MG tablet Take 10 mg by mouth 2 (two) times daily as needed for muscle spasms.    . Oxycodone HCl 10 MG TABS Take 1 tablet (10 mg total) by mouth 4 (four) times daily. 120 tablet 0  . tamsulosin (FLOMAX) 0.4 MG CAPS capsule 0.8 mg daily (Patient not taking: Reported on 02/20/2020) 60 capsule 2   No facility-administered medications prior to visit.    Review of Systems CNS: No confusion or sedation Cardiac: No angina or palpitations GI: No abdominal pain or constipation Constitutional: No nausea vomiting fevers or chills  Objective:  BP 125/79   Pulse 85   Temp (!) 97.3 F (36.3 C)   Resp 12   Ht '5\' 7"'$  (1.702 m)   Wt 245 lb (111.1 kg)   SpO2 97%   BMI 38.37 kg/m    BP Readings from Last 3 Encounters:  02/20/20 125/79  02/12/20 (!) 147/92  02/02/20 110/68     Wt Readings from Last 3 Encounters:  02/20/20 245 lb (111.1 kg)  02/12/20 250 lb (113.4 kg)  02/02/20 250 lb 8 oz (113.6 kg)     Physical Exam Pt is alert and oriented PERRL EOMI HEART IS RRR no murmur or rub LCTA no wheezing or rales MUSCULOSKELETAL reveals a trigger point in the left splenius capitis  muscle.  This does not radiate.  No trigger points are noted in the trapezius muscles and the tendon bulk is good.  He also has a positive straight leg raise in the left leg and some paraspinous muscle tenderness in the lumbar region but no overt trigger points.  He is ambulating with an antalgic gait and has chronic left lower extremity weakness at baseline.  Labs  Lab Results  Component Value Date   HGBA1C 7.5 (H) 10/16/2019   HGBA1C 7.9 (H) 05/05/2019   HGBA1C  7.8 10/06/2018   Lab Results  Component Value Date   MICROALBUR 1.4 05/05/2019   LDLCALC 34 10/16/2019   CREATININE 1.64 (H) 10/16/2019    -------------------------------------------------------------------------------------------------------------------- Lab Results  Component Value Date   WBC 5.9 02/03/2019   HGB 11.5 (L) 02/03/2019   HCT 35.9 (L) 02/03/2019   PLT 178 02/03/2019   GLUCOSE 112 (H) 10/16/2019   CHOL 86 10/16/2019   TRIG 126 10/16/2019   HDL 31 (L) 10/16/2019   LDLCALC 34 10/16/2019   ALT 18 10/16/2019   AST 17 10/16/2019   NA 140 10/16/2019   K 5.0 10/16/2019   CL 105 10/16/2019   CREATININE 1.64 (H) 10/16/2019   BUN 33 (H) 10/16/2019   CO2 30 10/16/2019   TSH 3.00 10/16/2019   PSA 0.7 05/05/2019   INR 1.26 07/07/2018   HGBA1C 7.5 (H) 10/16/2019   MICROALBUR 1.4 05/05/2019    --------------------------------------------------------------------------------------------------------------------- DG PAIN CLINIC C-ARM 1-60 MIN NO REPORT  Result Date: 02/20/2020 Fluoro was used, but no Radiologist interpretation will be provided. Please refer to "NOTES" tab for provider progress note.    Assessment & Plan:   Denard was seen today for back pain and neck pain.  Diagnoses and all orders for this visit:  Lumbar spondylosis with myelopathy -     ToxASSURE Select 13 (MW), Urine  DDD (degenerative disc disease), lumbar -     ToxASSURE Select 13 (MW), Urine  Chronic pain syndrome  Chronic,  continuous use of opioids  Lyme arthritis of multiple joints (HCC)  Cervicalgia  Sciatica, left side -     ToxASSURE Select 13 (MW), Urine  Left leg weakness  Left arm weakness  Other orders -     triamcinolone acetonide (KENALOG-40) injection 40 mg -     sodium chloride flush (NS) 0.9 % injection 10 mL -     ropivacaine (PF) 2 mg/mL (0.2%) (NAROPIN) injection 10 mL -     lidocaine (PF) (XYLOCAINE) 1 % injection 5 mL -     iohexol (OMNIPAQUE) 180 MG/ML injection 10 mL -     dexamethasone (DECADRON) injection 10 mg -     Oxycodone HCl 10 MG TABS; Take 1 tablet (10 mg total) by mouth 4 (four) times daily. -     cyclobenzaprine (FLEXERIL) 10 MG tablet; Take 1 tablet (10 mg total) by mouth 2 (two) times daily as needed for muscle spasms.        ----------------------------------------------------------------------------------------------------------------------  Problem List Items Addressed This Visit      Unprioritized   Left arm weakness   Left leg weakness    Other Visit Diagnoses    Lumbar spondylosis with myelopathy    -  Primary   Relevant Medications   triamcinolone acetonide (KENALOG-40) injection 40 mg   dexamethasone (DECADRON) injection 10 mg   Oxycodone HCl 10 MG TABS (Start on 04/12/2020)   cyclobenzaprine (FLEXERIL) 10 MG tablet   Other Relevant Orders   ToxASSURE Select 13 (MW), Urine   DDD (degenerative disc disease), lumbar       Relevant Medications   triamcinolone acetonide (KENALOG-40) injection 40 mg   dexamethasone (DECADRON) injection 10 mg   Oxycodone HCl 10 MG TABS (Start on 04/12/2020)   cyclobenzaprine (FLEXERIL) 10 MG tablet   Other Relevant Orders   ToxASSURE Select 13 (MW), Urine   Chronic pain syndrome       Relevant Medications   triamcinolone acetonide (KENALOG-40) injection 40 mg   ropivacaine (PF) 2 mg/mL (0.2%) (NAROPIN) injection  10 mL   lidocaine (PF) (XYLOCAINE) 1 % injection 5 mL   dexamethasone (DECADRON) injection 10 mg    Oxycodone HCl 10 MG TABS (Start on 04/12/2020)   cyclobenzaprine (FLEXERIL) 10 MG tablet   Chronic, continuous use of opioids       Lyme arthritis of multiple joints (HCC)       Relevant Medications   triamcinolone acetonide (KENALOG-40) injection 40 mg   dexamethasone (DECADRON) injection 10 mg   Oxycodone HCl 10 MG TABS (Start on 04/12/2020)   cyclobenzaprine (FLEXERIL) 10 MG tablet   Cervicalgia       Sciatica, left side       Relevant Medications   cyclobenzaprine (FLEXERIL) 10 MG tablet   Other Relevant Orders   ToxASSURE Select 13 (MW), Urine        ----------------------------------------------------------------------------------------------------------------------  1. Lumbar spondylosis with myelopathy Continue with core stretching strengthening exercises. - ToxASSURE Select 13 (MW), Urine  2. DDD (degenerative disc disease), lumbar We will proceed with a an epidural today as requested by the patient.  The risks and benefits of been reviewed all questions answered.  We will plan on a 32-monthreturn to clinic as well for medication management and review of our current pain syndrome - ToxASSURE Select 13 (MW), Urine  3. Chronic pain syndrome Urine drug screen was requested.  [facility] and it is appropriate.  We will refill his medicines for July 9.  4. Chronic, continuous use of opioids As above  5. Lyme arthritis of multiple joints (HCC)   6. Cervicalgia Trigger point injection today with the risks and benefits fully reviewed with [name]  7. Sciatica, left side As above - ToxASSURE Select 13 (MW), Urine  8. Left leg weakness Continue physical therapy stretching strengthening exercises  9. Left arm weakness As above    ----------------------------------------------------------------------------------------------------------------------  I have changed [name] "[name]"'s cyclobenzaprine. I  am also having him maintain his insulin lispro, loratadine, albuterol, gabapentin, metFORMIN, albuterol, Lantus SoloStar, naloxone, rosuvastatin, Insulin Pen Needle, aspirin EC, liraglutide, nitroGLYCERIN, gabapentin, furosemide, montelukast, cholecalciferol, ezetimibe, pyridostigmine, betamethasone valerate ointment, Multiple Vitamins-Minerals (MENS MULTI VITAMIN & MINERAL PO), B-12 Compliance Injection, Breo Ellipta, levocetirizine, tamsulosin, losartan, DULoxetine, levothyroxine, metoprolol succinate, clopidogrel, Dexilant, Oxycodone HCl, and Oxycodone HCl.   Meds ordered this encounter  Medications  . triamcinolone acetonide (KENALOG-40) injection 40 mg  . sodium chloride flush (NS) 0.9 % injection 10 mL  . ropivacaine (PF) 2 mg/mL (0.2%) (NAROPIN) injection 10 mL  . lidocaine (PF) (XYLOCAINE) 1 % injection 5 mL  . iohexol (OMNIPAQUE) 180 MG/ML injection 10 mL  . dexamethasone (DECADRON) injection 10 mg  . Oxycodone HCl 10 MG TABS    Sig: Take 1 tablet (10 mg total) by mouth 4 (four) times daily.    Dispense:  120 tablet    Refill:  0  . cyclobenzaprine (FLEXERIL) 10 MG tablet    Sig: Take 1 tablet (10 mg total) by mouth 2 (two) times daily as needed for muscle spasms.    Dispense:  60 tablet    Refill:  2   Patient's Medications  New Prescriptions   No medications on file  Previous Medications   ALBUTEROL (PROVENTIL HFA;VENTOLIN HFA) 108 (90 BASE) MCG/ACT INHALER    Inhale 2 puffs into the lungs every 6 (six) hours as needed for wheezing or shortness of breath.   ALBUTEROL (PROVENTIL) (2.5 MG/3ML) 0.083% NEBULIZER SOLUTION    Take 2.5 mg by nebulization  every 6 (six) hours as needed for wheezing or shortness of breath.   ASPIRIN EC 81 MG TABLET    Take 1 tablet (81 mg total) by mouth daily.   BETAMETHASONE VALERATE OINTMENT (VALISONE) 0.1 %    Apply topically.   CHOLECALCIFEROL (VITAMIN D3) 25 MCG (1000 UT) TABLET    Take 2,000 Units by mouth daily.   CLOPIDOGREL (PLAVIX) 75 MG  TABLET    Take 1 tablet (75 mg total) by mouth daily with breakfast.   CYANOCOBALAMIN (B-12 COMPLIANCE INJECTION) 1000 MCG/ML KIT    Inject 1,000 mcg as directed every 30 (thirty) days.   DEXLANSOPRAZOLE (DEXILANT) 60 MG CAPSULE    Take 1 capsule (60 mg total) by mouth daily.   DULOXETINE (CYMBALTA) 60 MG CAPSULE    TAKE 1 CAPSULE(60 MG) BY MOUTH DAILY   EZETIMIBE (ZETIA) 10 MG TABLET    Take 1 tablet (10 mg total) by mouth daily.   FLUTICASONE FUROATE-VILANTEROL (BREO ELLIPTA) 200-25 MCG/INH AEPB    Inhale 1 puff into the lungs daily.   FUROSEMIDE (LASIX) 20 MG TABLET    Take 20 mg by mouth every other day.   GABAPENTIN (NEURONTIN) 300 MG CAPSULE    Take 1 capsule (300 mg total) by mouth 3 (three) times daily.   GABAPENTIN (NEURONTIN) 800 MG TABLET    Take 800 mg by mouth at bedtime.   INSULIN LISPRO (HUMALOG KWIKPEN) 100 UNIT/ML KIWKPEN    Inject 7-8 Units into the skin 3 (three) times daily. Reported on 12/02/2015/ sliding scale 1 unit for every 8 units of carbs; and 1 unit for every 20 above 120   INSULIN PEN NEEDLE (FIFTY50 PEN NEEDLES) 32G X 4 MM MISC    Inject 1 each into the skin as needed.   LANTUS SOLOSTAR 100 UNIT/ML SOLOSTAR PEN    Inject 25-30 Units into the skin daily at 10 pm.    LEVOCETIRIZINE (XYZAL) 5 MG TABLET    TAKE 1 TABLET(5 MG) BY MOUTH EVERY EVENING   LEVOTHYROXINE (SYNTHROID) 25 MCG TABLET    TAKE 1 TABLET(25 MCG) BY MOUTH DAILY BEFORE BREAKFAST   LIRAGLUTIDE (VICTOZA) 18 MG/3ML SOPN    1.8 units every morning   LORATADINE (CLARITIN) 10 MG TABLET    Take 10 mg by mouth daily.    LOSARTAN (COZAAR) 25 MG TABLET    TAKE 1 TABLET(25 MG) BY MOUTH DAILY   METFORMIN (GLUCOPHAGE) 1000 MG TABLET    Take 1,000 mg by mouth 2 (two) times daily with a meal.   METOPROLOL SUCCINATE (TOPROL-XL) 100 MG 24 HR TABLET    Take 1 tablet (100 mg total) by mouth daily. Take with or immediately following a meal.   MONTELUKAST (SINGULAIR) 10 MG TABLET    Take 10 mg by mouth at bedtime.   MULTIPLE  VITAMINS-MINERALS (MENS MULTI VITAMIN & MINERAL PO)    Take 1 tablet by mouth daily.   NALOXONE (NARCAN) NASAL SPRAY 4 MG/0.1 ML    For excess sedation from opioids   NITROGLYCERIN (NITROSTAT) 0.3 MG SL TABLET    Place 1 tablet (0.3 mg total) under the tongue every 5 (five) minutes as needed for chest pain. Maximum 3 doses.   OXYCODONE HCL 10 MG TABS    Take 1 tablet (10 mg total) by mouth 4 (four) times daily.   PYRIDOSTIGMINE (MESTINON) 60 MG TABLET    Take 1 tablet by mouth 3 (three) times daily.   ROSUVASTATIN (CRESTOR) 5 MG TABLET    Take  5 mg by mouth daily.   TAMSULOSIN (FLOMAX) 0.4 MG CAPS CAPSULE    0.8 mg daily  Modified Medications   Modified Medication Previous Medication   CYCLOBENZAPRINE (FLEXERIL) 10 MG TABLET cyclobenzaprine (FLEXERIL) 10 MG tablet      Take 1 tablet (10 mg total) by mouth 2 (two) times daily as needed for muscle spasms.    Take 10 mg by mouth 2 (two) times daily as needed for muscle spasms.   OXYCODONE HCL 10 MG TABS Oxycodone HCl 10 MG TABS      Take 1 tablet (10 mg total) by mouth 4 (four) times daily.    Take 1 tablet (10 mg total) by mouth 4 (four) times daily.  Discontinued Medications   No medications on file   ----------------------------------------------------------------------------------------------------------------------  Follow-up: Return in about 2 months (around 04/21/2020) for evaluation, med refill.   Procedure: L5-S1 LESI with fluoroscopic guidance and no moderate sedation  NOTE: The risks, benefits, and expectations of the procedure have been discussed and explained to the patient who was understanding and in agreement with suggested treatment plan. No guarantees were made.  DESCRIPTION OF PROCEDURE: Lumbar epidural steroid injection with no IV Versed, EKG, blood pressure, pulse, and pulse oximetry monitoring. The procedure was performed with the patient in the prone position under fluoroscopic guidance.  Sterile prep x3 was initiated and I  then injected subcutaneous lidocaine to the overlying L5-S1 site after its fluoroscopic identifictation.  Using strict aseptic technique, I then advanced an 18-gauge Tuohy epidural needle in the midline using interlaminar approach via loss-of-resistance to saline technique. There was negative aspiration for heme or  CSF.  I then confirmed position with both AP and Lateral fluoroscan.  2 cc of contrast dye were injected and a  total of 5 mL of Preservative-Free normal saline mixed with 40 mg of Kenalog and 1cc Ropicaine 0.2 percent were injected incrementally via the  epidurally placed needle. The needle was removed. The patient tolerated the injection well and was convalesced and discharged to home in stable condition. Should the patient have any post procedure difficulty they have been instructed on how to contact us for assistance.   Trigger point injection: The area overlying the aforementioned trigger point was prepped with alcohol. They were then injected with a 25-gauge needle with 6 cc of ropivacaine 0.1% and Decadron 6 mg at each site after negative aspiration for heme. This was performed after informed consent was obtained and risks and benefits reviewed. She tolerated this procedure without difficulty and was convalesced and discharged to home in stable condition for follow-up as mentioned.  '@[email or web address]'$  [name], MD@  Molli Barrows, MD

## 2020-02-21 ENCOUNTER — Telehealth: Payer: Self-pay

## 2020-02-21 NOTE — Telephone Encounter (Signed)
Post procedure phone call.  LM 

## 2020-02-22 DIAGNOSIS — M5136 Other intervertebral disc degeneration, lumbar region: Secondary | ICD-10-CM | POA: Diagnosis not present

## 2020-02-26 LAB — TOXASSURE SELECT 13 (MW), URINE

## 2020-02-27 ENCOUNTER — Other Ambulatory Visit: Payer: Self-pay | Admitting: Cardiovascular Disease

## 2020-02-27 DIAGNOSIS — M79605 Pain in left leg: Secondary | ICD-10-CM

## 2020-02-28 ENCOUNTER — Other Ambulatory Visit: Payer: Self-pay | Admitting: Family Medicine

## 2020-02-28 DIAGNOSIS — E1165 Type 2 diabetes mellitus with hyperglycemia: Secondary | ICD-10-CM | POA: Diagnosis not present

## 2020-02-28 DIAGNOSIS — Z794 Long term (current) use of insulin: Secondary | ICD-10-CM | POA: Diagnosis not present

## 2020-02-28 DIAGNOSIS — J302 Other seasonal allergic rhinitis: Secondary | ICD-10-CM

## 2020-02-28 NOTE — Telephone Encounter (Signed)
Requested Prescriptions  Pending Prescriptions Disp Refills  . levocetirizine (XYZAL) 5 MG tablet [Pharmacy Med Name: LEVOCETIRIZINE 5MG  TABLETS] 30 tablet 3    Sig: TAKE 1 TABLET(5 MG) BY MOUTH EVERY EVENING     Ear, Nose, and Throat:  Antihistamines Passed - 02/28/2020  3:31 AM      Passed - Valid encounter within last 12 months    Recent Outpatient Visits          3 weeks ago Chronic systolic CHF (congestive heart failure) [facility])   [facility] [name], MD   4 months ago Myasthenia gravis [facility])   [facility] [name], MD   7 months ago Hypothyroidism, acquired, autoimmune   [facility] [place], Drue Stager, MD   8 months ago Type 2 diabetes mellitus with diabetic polyneuropathy, with long-term current use of insulin [facility])   [facility] [name], MD   9 months ago Urinary hesitancy   [facility] [name], MD      Future Appointments            In 1 month [name], MD [facility] [place]   In 4 months  [facility], [place]   In 5 months Arida, Mertie Clause, MD [facility], [place]

## 2020-03-05 ENCOUNTER — Other Ambulatory Visit: Payer: Self-pay | Admitting: *Deleted

## 2020-03-05 MED ORDER — TAMSULOSIN HCL 0.4 MG PO CAPS: ORAL_CAPSULE | ORAL | 2 refills | Status: DC

## 2020-03-20 ENCOUNTER — Telehealth: Payer: Self-pay | Admitting: Family Medicine

## 2020-03-20 NOTE — Chronic Care Management (AMB) (Signed)
  Chronic Care Management   Outreach Note  03/20/2020 Name: [name] MRN: [id number] DOB: 11-15-1960  [name] is a 59 y.o. year old male who is a primary care patient of [name], MD. I reached out to [name] by phone today in response to a referral sent by [name]'s health plan.     An unsuccessful telephone outreach was attempted today. The patient was referred to the case management team for assistance with care management and care coordination.   Follow Up Plan: A HIPPA compliant phone message was left for the patient providing contact information and requesting a return call.  The care management team will reach out to the patient again over the next 7 days.  If patient returns call to provider office, please advise to call Hector at [place], [place], [place], [place] [address] Direct Dial: [phone] [email or web address]: [email or web address]

## 2020-03-21 DIAGNOSIS — E1142 Type 2 diabetes mellitus with diabetic polyneuropathy: Secondary | ICD-10-CM | POA: Diagnosis not present

## 2020-03-21 DIAGNOSIS — E785 Hyperlipidemia, unspecified: Secondary | ICD-10-CM | POA: Diagnosis not present

## 2020-03-21 DIAGNOSIS — E1169 Type 2 diabetes mellitus with other specified complication: Secondary | ICD-10-CM | POA: Diagnosis not present

## 2020-03-21 DIAGNOSIS — Z794 Long term (current) use of insulin: Secondary | ICD-10-CM | POA: Diagnosis not present

## 2020-03-21 DIAGNOSIS — E1159 Type 2 diabetes mellitus with other circulatory complications: Secondary | ICD-10-CM | POA: Diagnosis not present

## 2020-03-25 NOTE — Chronic Care Management (AMB) (Signed)
  Chronic Care Management   Note  03/25/2020 Name: [name] MRN: [id number] DOB: [birth year]  [name] is a 59 y.o. year old male who is a primary care patient of [name], MD. I reached out to [name] by phone today in response to a referral sent by [name] health plan.     [name] was given information about Chronic Care Management services today including:  1. CCM service includes personalized support from designated clinical staff supervised by his physician, including individualized plan of care and coordination with other care providers 2. 24/7 contact phone numbers for assistance for urgent and routine care needs. 3. Service will only be billed when office clinical staff spend 20 minutes or more in a month to coordinate care. 4. Only one practitioner may furnish and bill the service in a calendar month. 5. The patient may stop CCM services at any time (effective at the end of the month) by phone call to the office staff. 6. The patient will be responsible for cost sharing (co-pay) of up to 20% of the service fee (after annual deductible is met).  Patient agreed to services and verbal consent obtained.   Follow up plan: Telephone appointment with care management team member scheduled for: 04/24/2020  [name], [place], [place], [place] [address] Direct Dial: [phone] [email or web address]

## 2020-03-29 DIAGNOSIS — Z794 Long term (current) use of insulin: Secondary | ICD-10-CM | POA: Diagnosis not present

## 2020-03-29 DIAGNOSIS — E1165 Type 2 diabetes mellitus with hyperglycemia: Secondary | ICD-10-CM | POA: Diagnosis not present

## 2020-04-03 ENCOUNTER — Ambulatory Visit: Payer: Medicare Other | Admitting: Gastroenterology

## 2020-04-18 ENCOUNTER — Other Ambulatory Visit: Payer: Self-pay | Admitting: Cardiovascular Disease

## 2020-04-18 DIAGNOSIS — M79604 Pain in right leg: Secondary | ICD-10-CM

## 2020-04-18 DIAGNOSIS — M79605 Pain in left leg: Secondary | ICD-10-CM

## 2020-04-24 ENCOUNTER — Ambulatory Visit: Payer: Self-pay

## 2020-04-24 ENCOUNTER — Telehealth: Payer: Medicare Other

## 2020-04-24 NOTE — Chronic Care Management (AMB) (Signed)
  Chronic Care Management   Outreach Note  04/24/2020 Name: [name] MRN: [id number] DOB: Apr 29, 1961  Primary Care Provider: [name], MD Reason for referral : Chronic Care Management    [name] was referred to the care management team for assistance with chronic care management and care coordination. An automated prompt was received today indicating that he is not accepting calls. No option to leave a voice message. Per previous enrollment entry, member was not interested in the chronic care management program. Will avoid further outreach attempts.    PLAN The care management team will gladly follow up with [name] after the primary care provider has a conversation with him regarding recommendation for care management engagement and subsequent re-referral for care management services.   [facility] Care Management [phone]

## 2020-04-26 ENCOUNTER — Other Ambulatory Visit: Payer: Self-pay

## 2020-04-26 ENCOUNTER — Ambulatory Visit (INDEPENDENT_AMBULATORY_CARE_PROVIDER_SITE_OTHER): Payer: Medicare Other

## 2020-04-26 DIAGNOSIS — M79604 Pain in right leg: Secondary | ICD-10-CM | POA: Diagnosis not present

## 2020-04-26 DIAGNOSIS — M79605 Pain in left leg: Secondary | ICD-10-CM | POA: Diagnosis not present

## 2020-04-28 ENCOUNTER — Other Ambulatory Visit: Payer: Self-pay | Admitting: Cardiovascular Disease

## 2020-05-01 DIAGNOSIS — Z794 Long term (current) use of insulin: Secondary | ICD-10-CM | POA: Diagnosis not present

## 2020-05-01 DIAGNOSIS — E1165 Type 2 diabetes mellitus with hyperglycemia: Secondary | ICD-10-CM | POA: Diagnosis not present

## 2020-05-06 ENCOUNTER — Other Ambulatory Visit: Payer: Self-pay

## 2020-05-06 ENCOUNTER — Ambulatory Visit: Payer: Medicare Other | Attending: Anesthesiology | Admitting: Anesthesiology

## 2020-05-06 ENCOUNTER — Encounter: Payer: Self-pay | Admitting: Anesthesiology

## 2020-05-06 DIAGNOSIS — A6923 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: Secondary | ICD-10-CM

## 2020-05-06 DIAGNOSIS — G894 Chronic pain syndrome: Secondary | ICD-10-CM

## 2020-05-06 DIAGNOSIS — M5136 Other intervertebral disc degeneration, lumbar region: Secondary | ICD-10-CM | POA: Diagnosis not present

## 2020-05-06 DIAGNOSIS — R29898 Other symptoms and signs involving the musculoskeletal system: Secondary | ICD-10-CM | POA: Diagnosis not present

## 2020-05-06 DIAGNOSIS — M5432 Sciatica, left side: Secondary | ICD-10-CM

## 2020-05-06 DIAGNOSIS — M4716 Other spondylosis with myelopathy, lumbar region: Secondary | ICD-10-CM | POA: Diagnosis not present

## 2020-05-06 DIAGNOSIS — M25561 Pain in right knee: Secondary | ICD-10-CM

## 2020-05-06 DIAGNOSIS — F1190 Opioid use, unspecified, uncomplicated: Secondary | ICD-10-CM

## 2020-05-06 DIAGNOSIS — M542 Cervicalgia: Secondary | ICD-10-CM

## 2020-05-06 DIAGNOSIS — M25569 Pain in unspecified knee: Secondary | ICD-10-CM

## 2020-05-06 HISTORY — DX: Pain in unspecified knee: M25.569

## 2020-05-06 MED ORDER — OXYCODONE HCL 5 MG PO TABS: 10.0000 mg | ORAL_TABLET | Freq: Four times a day (QID) | ORAL | 0 refills | Status: DC

## 2020-05-06 MED ORDER — OXYCODONE HCL 5 MG PO TABS: 10.0000 mg | ORAL_TABLET | Freq: Four times a day (QID) | ORAL | 0 refills | Status: AC

## 2020-05-07 NOTE — Progress Notes (Signed)
Virtual Visit via Telephone Note  I connected with [name] on 05/07/20 at  2:30 PM EDT by telephone and verified that I am speaking with the correct person using two identifiers.  Location: Patient: Home Provider: Pain clinic   I discussed the limitations, risks, security and privacy concerns of performing an evaluation and management service by telephone and the availability of in person appointments. I also discussed with the patient that there may be a patient responsible charge related to this service. The patient expressed understanding and agreed to proceed.   History of Present Illness: I spoke with [name] today via telephone as he was unable to do the video portion of the virtual conference.  He reports that he is been doing reasonably well and especially for his most recent procedure.  His low back pain is under better control and he is having less breakthrough pain during the day.  He is taking his medications as prescribed and these continue to work well for him.  Otherwise he is in his usual state of health.  He does report that at his pharmacy he has had difficulty getting the 10 mg oxycodone filled as they have run out of these.  Otherwise no change in the quality characteristic or distribution of his back pain is noted.  His leg pain is reportedly much better.    Observations/Objective:  Current Outpatient Medications:  .  albuterol (PROVENTIL HFA;VENTOLIN HFA) 108 (90 Base) MCG/ACT inhaler, Inhale 2 puffs into the lungs every 6 (six) hours as needed for wheezing or shortness of breath. (Patient not taking: Reported on 05/06/2020), Disp: , Rfl:  .  albuterol (PROVENTIL) (2.5 MG/3ML) 0.083% nebulizer solution, Take 2.5 mg by nebulization every 6 (six) hours as needed for wheezing or shortness of breath. (Patient not taking: Reported on 05/06/2020), Disp: , Rfl:  .  aspirin EC 81 MG tablet, Take 1 tablet (81 mg total) by mouth daily. (Patient not taking: Reported on  05/06/2020), Disp: 90 tablet, Rfl: 3 .  betamethasone valerate ointment (VALISONE) 0.1 %, Apply topically., Disp: , Rfl:  .  cholecalciferol (VITAMIN D3) 25 MCG (1000 UT) tablet, Take 2,000 Units by mouth daily., Disp: , Rfl:  .  clopidogrel (PLAVIX) 75 MG tablet, Take 1 tablet (75 mg total) by mouth daily with breakfast. (Patient not taking: Reported on 05/06/2020), Disp: 30 tablet, Rfl: 5 .  Cyanocobalamin (B-12 COMPLIANCE INJECTION) 1000 MCG/ML KIT, Inject 1,000 mcg as directed every 30 (thirty) days. (Patient not taking: Reported on 05/06/2020), Disp: , Rfl:  .  dexlansoprazole (DEXILANT) 60 MG capsule, Take 1 capsule (60 mg total) by mouth daily., Disp: 30 capsule, Rfl: 11 .  DULoxetine (CYMBALTA) 60 MG capsule, TAKE 1 CAPSULE(60 MG) BY MOUTH DAILY, Disp: 90 capsule, Rfl: 0 .  ezetimibe (ZETIA) 10 MG tablet, Take 1 tablet (10 mg total) by mouth daily., Disp: 90 tablet, Rfl: 3 .  fluticasone furoate-vilanterol (BREO ELLIPTA) 200-25 MCG/INH AEPB, Inhale 1 puff into the lungs daily., Disp: 3 each, Rfl: 1 .  furosemide (LASIX) 20 MG tablet, Take 20 mg by mouth every other day., Disp: , Rfl:  .  gabapentin (NEURONTIN) 300 MG capsule, Take 1 capsule (300 mg total) by mouth 3 (three) times daily., Disp: 90 capsule, Rfl: 1 .  gabapentin (NEURONTIN) 800 MG tablet, Take 800 mg by mouth at bedtime., Disp: , Rfl:  .  insulin lispro (HUMALOG KWIKPEN) 100 UNIT/ML KiwkPen, Inject 7-8 Units into the skin 3 (three) times daily. Reported on 12/02/2015/  sliding scale 1 unit for every 8 units of carbs; and 1 unit for every 20 above 120, Disp: , Rfl:  .  Insulin Pen Needle (FIFTY50 PEN NEEDLES) 32G X 4 MM MISC, Inject 1 each into the skin as needed., Disp: , Rfl:  .  LANTUS SOLOSTAR 100 UNIT/ML Solostar Pen, Inject 25-30 Units into the skin daily at 10 pm. , Disp: , Rfl: 0 .  levocetirizine (XYZAL) 5 MG tablet, TAKE 1 TABLET(5 MG) BY MOUTH EVERY EVENING, Disp: 30 tablet, Rfl: 3 .  levothyroxine (SYNTHROID) 25 MCG tablet,  TAKE 1 TABLET(25 MCG) BY MOUTH DAILY BEFORE BREAKFAST, Disp: 90 tablet, Rfl: 0 .  liraglutide (VICTOZA) 18 MG/3ML SOPN, 1.8 units every morning (Patient taking differently: Inject 1.8 mg into the skin daily. ), Disp: , Rfl:  .  loratadine (CLARITIN) 10 MG tablet, Take 10 mg by mouth daily. , Disp: , Rfl:  .  losartan (COZAAR) 25 MG tablet, TAKE 1 TABLET(25 MG) BY MOUTH DAILY, Disp: 30 tablet, Rfl: 5 .  metFORMIN (GLUCOPHAGE) 1000 MG tablet, Take 1,000 mg by mouth 2 (two) times daily with a meal., Disp: , Rfl:  .  metoprolol succinate (TOPROL-XL) 100 MG 24 hr tablet, Take 1 tablet (100 mg total) by mouth daily. Take with or immediately following a meal., Disp: 90 tablet, Rfl: 3 .  montelukast (SINGULAIR) 10 MG tablet, Take 10 mg by mouth at bedtime., Disp: , Rfl:  .  Multiple Vitamins-Minerals (MENS MULTI VITAMIN & MINERAL PO), Take 1 tablet by mouth daily., Disp: , Rfl:  .  naloxone (NARCAN) nasal spray 4 mg/0.1 mL, For excess sedation from opioids, Disp: 1 kit, Rfl: 2 .  nitroGLYCERIN (NITROSTAT) 0.3 MG SL tablet, Place 1 tablet (0.3 mg total) under the tongue every 5 (five) minutes as needed for chest pain. Maximum 3 doses., Disp: 25 tablet, Rfl: 3 .  [START ON 05/12/2020] oxyCODONE (ROXICODONE) 5 MG immediate release tablet, Take 2 tablets (10 mg total) by mouth 4 (four) times daily., Disp: 240 tablet, Rfl: 0 .  [START ON 06/11/2020] oxyCODONE (ROXICODONE) 5 MG immediate release tablet, Take 2 tablets (10 mg total) by mouth 4 (four) times daily., Disp: 240 tablet, Rfl: 0 .  Oxycodone HCl 10 MG TABS, Take 1 tablet (10 mg total) by mouth 4 (four) times daily., Disp: 120 tablet, Rfl: 0 .  pyridostigmine (MESTINON) 60 MG tablet, Take 1 tablet by mouth 3 (three) times daily., Disp: , Rfl:  .  rosuvastatin (CRESTOR) 5 MG tablet, Take 5 mg by mouth daily., Disp: , Rfl: 0 .  tamsulosin (FLOMAX) 0.4 MG CAPS capsule, 0.8 mg daily, Disp: 60 capsule, Rfl: 2  Assessment and Plan:  1. Left leg weakness   2.  Lumbar spondylosis with myelopathy   3. DDD (degenerative disc disease), lumbar   4. Chronic pain syndrome   5. Chronic, continuous use of opioids   6. Lyme arthritis of multiple joints (HCC)   7. Cervicalgia   8. Sciatica, left side   9. Acute pain of right knee   Based upon review of the [facility] practitioner database information going to refill his medications.  He seems to be doing well with his opioid management.  We will change this over to 5 mg strength to help facilitate filling this for pharmacy reasons.  He is to take 2 of the 5 mg tablets on a 4 times daily basis.  We gone over the risks and benefits of chronic opioid management with him multiple times  and he understands these.  Will defer on any repeat injection today but will schedule him for repeat epidural in 2 months.  This is consistent with his needs in the past.  I want him to continue with core stretching strengthening exercises as previously reviewed and continue follow-up with his primary care physicians for his baseline medical care. Follow Up Instructions:    I discussed the assessment and treatment plan with the patient. The patient was provided an opportunity to ask questions and all were answered. The patient agreed with the plan and demonstrated an understanding of the instructions.   The patient was advised to call back or seek an in-person evaluation if the symptoms worsen or if the condition fails to improve as anticipated.  I provided 30 minutes of non-face-to-face time during this encounter.   Molli Barrows, MD

## 2020-05-10 ENCOUNTER — Other Ambulatory Visit: Payer: Self-pay | Admitting: Family Medicine

## 2020-05-10 ENCOUNTER — Encounter: Payer: Self-pay | Admitting: Anesthesiology

## 2020-05-10 DIAGNOSIS — J302 Other seasonal allergic rhinitis: Secondary | ICD-10-CM

## 2020-05-17 ENCOUNTER — Ambulatory Visit (INDEPENDENT_AMBULATORY_CARE_PROVIDER_SITE_OTHER): Payer: Medicare Other | Admitting: Family Medicine

## 2020-05-17 ENCOUNTER — Encounter: Payer: Self-pay | Admitting: Family Medicine

## 2020-05-17 ENCOUNTER — Other Ambulatory Visit: Payer: Self-pay

## 2020-05-17 DIAGNOSIS — E1142 Type 2 diabetes mellitus with diabetic polyneuropathy: Secondary | ICD-10-CM

## 2020-05-17 DIAGNOSIS — J4541 Moderate persistent asthma with (acute) exacerbation: Secondary | ICD-10-CM

## 2020-05-17 DIAGNOSIS — J99 Respiratory disorders in diseases classified elsewhere: Secondary | ICD-10-CM | POA: Insufficient documentation

## 2020-05-17 DIAGNOSIS — M4697 Unspecified inflammatory spondylopathy, lumbosacral region: Secondary | ICD-10-CM

## 2020-05-17 DIAGNOSIS — M25561 Pain in right knee: Secondary | ICD-10-CM | POA: Diagnosis not present

## 2020-05-17 DIAGNOSIS — Z794 Long term (current) use of insulin: Secondary | ICD-10-CM

## 2020-05-17 MED ORDER — TRELEGY ELLIPTA 100-62.5-25 MCG/INH IN AEPB: 1.0000 | INHALATION_SPRAY | Freq: Every day | RESPIRATORY_TRACT | 2 refills | Status: DC

## 2020-05-17 NOTE — Progress Notes (Signed)
Name: [name]   MRN: [id number]    DOB: [birth year]   Date:05/17/2020       Progress Note  Subjective  Chief Complaint  Chief Complaint  Patient presents with  . Knee Pain    Right knee pain, fell twice and landed on knees  . Chest congestion    HPI  Right knee pain: he states about 10 years, he injured his knee during a fall, the leg flexed behind him. He states 6 weeks ago he missed a the last step and fell on both knees and caught himself with his hands, one day later he missed another step and fell on right knee again. He states he is able to bear weight, but has noticed daily pain, popping sensation and is swollen and unstable. He never had surgery. Recent falls, he states he will wait to see ortho to start PT    Asthma with acute exacerbation: he states for the past week has noticed nasal congestion, dry cough and wheezing, he has been using his rescue inhaler three times daily, no sick contacts. He has allergies and feels like he is getting a flare of that also. He has moved recently and is adjusting to change in his environment, a lot of dust and also a lot of trees   DMII: not controlled, last A1C was above 9 %, he is under the care of Dr. Honor Junes, he states he may need an insulin pump , he states recently glucose at goal in the low 100's. Denies polyphagia, polydipsia and polyuria   Low back pain/ inflammatory spondylopathy : under the care of Dr. [name], states pain is under control at this time with current regiment. Pain is 3/10   Morbid obesity: he has BMI above 35 with co-morbidities such as DM, dyslipidemia, OSA, CHF. He is trying to lose weight. He is following a diabetic diet again.   Patient Active Problem List   Diagnosis Date Noted  . Knee pain, acute 05/06/2020  . Left leg weakness 12/01/2019  . Left arm weakness 10/04/2019  . Lower urinary tract symptoms 09/15/2019  . Incomplete bladder emptying 09/15/2019  . Carpal tunnel syndrome on both sides  07/12/2019  . Weakness generalized 06/21/2019  . Idiopathic peripheral neuropathy 04/21/2019  . Sensory ataxia 03/09/2019  . Colitis 07/08/2018  . OSA on CPAP 07/08/2018  . Congestive heart failure (CHF) ([place]) 05/05/2018  . Unstable angina ([place]) 04/26/2018  . Ischemic cardiomyopathy 04/26/2018  . Chronic combined systolic and diastolic CHF (congestive heart failure) ([place]) 04/26/2018  . Effort angina ([place]) 04/25/2018  . 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[place]) 01/25/2018  . Hyperlipidemia due to type 2 diabetes mellitus ([place]) 08/12/2017  . Vitamin D deficiency, unspecified 08/12/2017  . Ptosis of left eyelid 01/08/2017  . Dermatitis 12/24/2016  . Difficulty walking 04/27/2016  . Foot cramps 04/27/2016  . Morbid (severe) obesity due to excess calories ([place]) 04/06/2016  . Benign neoplasm of sigmoid colon   . Benign neoplasm of descending colon   . Benign neoplasm of transverse colon   . Coronary artery disease involving native coronary artery with angina pectoris ([place]) 10/22/2015  . Coronary artery disease 08/22/2015  . DM (diabetes mellitus), type 2, uncontrolled with complications ([place]) 54/62/7035  . Myasthenia gravis ([place]) 08/22/2015  . Chronic left-sided low back pain 08/22/2015  . Hypertension 08/22/2015  . GERD (gastroesophageal reflux disease) 08/22/2015  . Arthritis 08/22/2015  . Diabetic peripheral neuropathy ([place]) 08/22/2015  . Hyperlipidemia 08/22/2015  . Insomnia 08/22/2015  .  Type 2 diabetes mellitus with diabetic polyneuropathy, with long-term current use of insulin ([place]) 08/22/2015  . Asthma 08/22/2015  . Lyme disease 06/05/1992    Past Surgical History:  Procedure Laterality Date  . BILATERAL CARPAL TUNNEL RELEASE Bilateral L in 2012 and R in 2013  . CARDIAC CATHETERIZATION     Several Caths, most recent in  March 2016.  [name] COLONOSCOPY WITH PROPOFOL N/A 01/10/2016   Procedure: COLONOSCOPY WITH PROPOFOL;  Surgeon: [name], MD;  Location: [facility]  ENDOSCOPY;  Service: Endoscopy;  Laterality: N/A;  . CORONARY ANGIOPLASTY    . CORONARY STENT INTERVENTION N/A 04/25/2018   Procedure: CORONARY STENT INTERVENTION;  Surgeon: [name], MD;  Location: [place] [facility];  Service: Cardiovascular;  Laterality: N/A;  . ESOPHAGOGASTRODUODENOSCOPY (EGD) WITH PROPOFOL N/A 01/10/2016   Procedure: ESOPHAGOGASTRODUODENOSCOPY (EGD) WITH PROPOFOL;  Surgeon: [name], MD;  Location: [facility] ENDOSCOPY;  Service: Endoscopy;  Laterality: N/A;  . EYE SURGERY Bilateral 2012   cataract/bilateral vitrectomies  . LEFT HEART CATH AND CORONARY ANGIOGRAPHY Left 04/25/2018   Procedure: LEFT HEART CATH AND CORONARY ANGIOGRAPHY;  Surgeon: [name], MD;  Location: [place] [facility];  Service: Cardiovascular;  Laterality: Left;  . TEE WITHOUT CARDIOVERSION N/A 09/05/2018   Procedure: TRANSESOPHAGEAL ECHOCARDIOGRAM (TEE);  Surgeon: [name], MD;  Location: [facility] ORS;  Service: Cardiovascular;  Laterality: N/A;  . TONSILLECTOMY AND ADENOIDECTOMY     As a child  . TUNNELED VENOUS CATHETER PLACEMENT     removed    Family History  Problem Relation Age of Onset  . Diabetes Mother   . Heart disease Mother   . Cancer Father        Prostate CA, Anal cancer   . Dementia Father   . Diabetes Brother   . Healthy Brother   . Healthy Brother     Social History   Tobacco Use  . Smoking status: Never Smoker  . Smokeless tobacco: Never Used  . Tobacco comment: smoking cessation materials not required  Substance Use Topics  . Alcohol use: Not Currently    Alcohol/week: 0.0 standard drinks     Current Outpatient Medications:  .  albuterol (PROVENTIL HFA;VENTOLIN HFA) 108 (90 Base) MCG/ACT inhaler, Inhale 2 puffs into the lungs every 6 (six) hours as needed for wheezing or shortness of breath. , Disp: , Rfl:  .  aspirin EC 81 MG tablet, Take 1 tablet (81 mg total) by mouth daily., Disp: 90 tablet, Rfl: 3 .  betamethasone valerate ointment  (VALISONE) 0.1 %, Apply topically., Disp: , Rfl:  .  cholecalciferol (VITAMIN D3) 25 MCG (1000 UT) tablet, Take 2,000 Units by mouth daily., Disp: , Rfl:  .  clopidogrel (PLAVIX) 75 MG tablet, Take 1 tablet (75 mg total) by mouth daily with breakfast., Disp: 30 tablet, Rfl: 5 .  dexlansoprazole (DEXILANT) 60 MG capsule, Take 1 capsule (60 mg total) by mouth daily., Disp: 30 capsule, Rfl: 11 .  DULoxetine (CYMBALTA) 60 MG capsule, TAKE 1 CAPSULE(60 MG) BY MOUTH DAILY, Disp: 90 capsule, Rfl: 0 .  ezetimibe (ZETIA) 10 MG tablet, Take 1 tablet (10 mg total) by mouth daily., Disp: 90 tablet, Rfl: 3 .  fluticasone furoate-vilanterol (BREO ELLIPTA) 200-25 MCG/INH AEPB, Inhale 1 puff into the lungs daily., Disp: 3 each, Rfl: 1 .  furosemide (LASIX) 20 MG tablet, Take 20 mg by mouth every other day., Disp: , Rfl:  .  gabapentin (NEURONTIN) 300 MG capsule, Take 1 capsule (300 mg total) by  mouth 3 (three) times daily., Disp: 90 capsule, Rfl: 1 .  gabapentin (NEURONTIN) 800 MG tablet, Take 800 mg by mouth at bedtime., Disp: , Rfl:  .  insulin lispro (HUMALOG KWIKPEN) 100 UNIT/ML KiwkPen, Inject 7-8 Units into the skin 3 (three) times daily. Reported on 12/02/2015/ sliding scale 1 unit for every 8 units of carbs; and 1 unit for every 20 above 120, Disp: , Rfl:  .  Insulin Pen Needle (FIFTY50 PEN NEEDLES) 32G X 4 MM MISC, Inject 1 each into the skin as needed., Disp: , Rfl:  .  LANTUS SOLOSTAR 100 UNIT/ML Solostar Pen, Inject 25-30 Units into the skin daily at 10 pm. , Disp: , Rfl: 0 .  levocetirizine (XYZAL) 5 MG tablet, TAKE 1 TABLET(5 MG) BY MOUTH EVERY EVENING, Disp: 30 tablet, Rfl: 3 .  levothyroxine (SYNTHROID) 25 MCG tablet, TAKE 1 TABLET(25 MCG) BY MOUTH DAILY BEFORE BREAKFAST, Disp: 90 tablet, Rfl: 0 .  liraglutide (VICTOZA) 18 MG/3ML SOPN, 1.8 units every morning (Patient taking differently: Inject 1.8 mg into the skin daily. ), Disp: , Rfl:  .  loratadine (CLARITIN) 10 MG tablet, Take 10 mg by mouth  daily. , Disp: , Rfl:  .  losartan (COZAAR) 25 MG tablet, TAKE 1 TABLET(25 MG) BY MOUTH DAILY, Disp: 30 tablet, Rfl: 5 .  metFORMIN (GLUCOPHAGE) 1000 MG tablet, Take 1,000 mg by mouth 2 (two) times daily with a meal., Disp: , Rfl:  .  metoprolol succinate (TOPROL-XL) 100 MG 24 hr tablet, Take 1 tablet (100 mg total) by mouth daily. Take with or immediately following a meal., Disp: 90 tablet, Rfl: 3 .  montelukast (SINGULAIR) 10 MG tablet, Take 10 mg by mouth at bedtime., Disp: , Rfl:  .  Multiple Vitamins-Minerals (MENS MULTI VITAMIN & MINERAL PO), Take 1 tablet by mouth daily., Disp: , Rfl:  .  oxyCODONE (ROXICODONE) 5 MG immediate release tablet, Take 2 tablets (10 mg total) by mouth 4 (four) times daily., Disp: 240 tablet, Rfl: 0 .  [START ON 06/11/2020] oxyCODONE (ROXICODONE) 5 MG immediate release tablet, Take 2 tablets (10 mg total) by mouth 4 (four) times daily., Disp: 240 tablet, Rfl: 0 .  pyridostigmine (MESTINON) 60 MG tablet, Take 1 tablet by mouth 3 (three) times daily., Disp: , Rfl:  .  rosuvastatin (CRESTOR) 5 MG tablet, Take 5 mg by mouth daily., Disp: , Rfl: 0 .  tamsulosin (FLOMAX) 0.4 MG CAPS capsule, 0.8 mg daily, Disp: 60 capsule, Rfl: 2 .  albuterol (PROVENTIL) (2.5 MG/3ML) 0.083% nebulizer solution, Take 2.5 mg by nebulization every 6 (six) hours as needed for wheezing or shortness of breath. (Patient not taking: Reported on 05/06/2020), Disp: , Rfl:  .  Cyanocobalamin (B-12 COMPLIANCE INJECTION) 1000 MCG/ML KIT, Inject 1,000 mcg as directed every 30 (thirty) days. (Patient not taking: Reported on 05/06/2020), Disp: , Rfl:  .  naloxone (NARCAN) nasal spray 4 mg/0.1 mL, For excess sedation from opioids (Patient not taking: Reported on 05/17/2020), Disp: 1 kit, Rfl: 2 .  nitroGLYCERIN (NITROSTAT) 0.3 MG SL tablet, Place 1 tablet (0.3 mg total) under the tongue every 5 (five) minutes as needed for chest pain. Maximum 3 doses. (Patient not taking: Reported on 05/17/2020), Disp: 25 tablet,  Rfl: 3  Allergies  Allergen Reactions  . Azathioprine Other (See Comments)    Azathioprine hypersensitivity reaction  . Novolog [Insulin Aspart] Hives    I personally reviewed active problem list, medication list, allergies, family history, social history, health maintenance with the patient/caregiver today.  ROS  Constitutional: Negative for fever or weight change.  Respiratory positive for cough and shortness of breath.   Cardiovascular: Negative for chest pain or palpitations.  Gastrointestinal: Negative for abdominal pain, no bowel changes.  Musculoskeletal: Positive for gait problem and right knee joint swelling.  Skin: Negative for rash.  Neurological: Negative for dizziness or headache.  No other specific complaints in a complete review of systems (except as listed in HPI above).  Objective  Vitals:   05/17/20 1137  BP: 120/80  Pulse: 91  Resp: 16  Temp: 98 F (36.7 C)  TempSrc: Oral  SpO2: 99%  Weight: 250 lb 3.2 oz (113.5 kg)  Height: '5\' 7"'$  (1.702 m)    Body mass index is 39.19 kg/m.  Physical Exam  Constitutional: Patient appears well-developed and well-nourished. Obese  No distress.  HEENT: head atraumatic, normocephalic, pupils equal and reactive to light,  neck supple Cardiovascular: Normal rate, regular rhythm and normal heart sounds.  No murmur heard. No BLE edema. Pulmonary/Chest: Effort normal , bilateral expiratory wheezing . No respiratory distress. Abdominal: Soft.  There is no tenderness. Muscular Skeletal: mild effusion, no redness or increase in warmth, uses a cane, antalgic gait  Psychiatric: Patient has a normal mood and affect. behavior is normal. Judgment and thought content normal.  Recent Results (from the past 2160 hour(s))  [employer] Select 13 (MW), Urine     Status: None   Collection Time: 02/22/20 12:45 PM  Result Value Ref Range   Summary Note     Comment:  ==================================================================== [employer] Select 13 (MW) ==================================================================== Test                             Result       Flag       Units Drug Present and Declared for Prescription Verification   Oxycodone                      2992         EXPECTED   ng/mg creat   Oxymorphone                    717          EXPECTED   ng/mg creat   Noroxycodone                   6367         EXPECTED   ng/mg creat   Noroxymorphone                 596          EXPECTED   ng/mg creat    Sources of oxycodone are scheduled prescription medications.    Oxymorphone, noroxycodone, and noroxymorphone are expected    metabolites of oxycodone. Oxymorphone is also available as a    scheduled prescription medication. ==================================================================== Test                      Result    Flag   Units      Ref Range   Creatinine              24               mg/dL      >=20 ====== ============================================================== Declared Medications:  The flagging and interpretation on this report are based on the  following declared medications.  Unexpected  results may arise from  inaccuracies in the declared medications.  **Note: The testing scope of this panel includes these medications:  Oxycodone  **Note: The testing scope of this panel does not include the  following reported medications:  Albuterol  Aspirin  Clopidogrel (Plavix)  Cyanocobalamin  Cyclobenzaprine  Dexlansoprazole (Dexilant)  Duloxetine  Ezetimibe (Zetia)  Fluticasone (Breo)  Furosemide (Lasix)  Gabapentin  Insulin  Levocetirizine (Xyzal)  Levothyroxine  Liraglutide (Victoza)  Loratadine (Claritin)  Losartan (Cozaar)  Metformin (Glucophage)  Metoprolol  Montelukast (Singulair)  Multivitamin  Naloxone  Nitroglycerin (Nitrostat)  Pyridostigmine (Mestinon)  Rosuvastatin (Crestor)  Tamsulosin  (Flomax)  Topical  Vilanterol (Breo)  Vitamin D3 === ================================================================= For clinical consultation, please call [phone]. ====================================================================      PHQ2/9: Depression screen [facility] 2/9 05/17/2020 02/20/2020 02/02/2020 10/16/2019 07/21/2019  Decreased Interest 0 0 _0 Down, Depressed, Hopeless 0 0 _1 PHQ - 2 Score 0 0 _2 Altered sleeping 0 - _3 Tired, decreased energy 0 - _4 Change in appetite 1 - _5 Feeling bad or failure about yourself  0 - 0 0 0  Trouble concentrating 0 - _6 Moving slowly or fidgety/restless 0 - 0 0 1  Suicidal thoughts 0 - 0 0 0  PHQ-9 Score 1 - _7 Difficult doing work/chores Not difficult at all - Somewhat difficult Somewhat difficult Somewhat difficult  Some recent data might be hidden    phq 9 is negative   Fall Risk: Fall Risk  05/17/2020 02/20/2020 02/12/2020 02/02/2020 10/16/2019  Falls in the past year? _8 0 0  Comment - - - - -  Number falls in past yr: 1 - 0 0 0  Comment - - - - -  Injury with Fall? 1 0 0 0 0  Risk Factor Category  - - - - -  Risk for fall due to : - - - - -  Risk for fall due to: Comment - - - - -  Follow up - - - - -     Functional Status Survey: Is the patient deaf or have difficulty hearing?: Yes Does the patient have difficulty seeing, even when wearing glasses/contacts?: No Does the patient have difficulty concentrating, remembering, or making decisions?: No Does the patient have difficulty walking or climbing stairs?: Yes Does the patient have difficulty dressing or bathing?: No Does the patient have difficulty doing errands alone such as visiting a doctor's office or shopping?: No    Assessment & Plan  1. Inflammatory spondylopathy of lumbosacral region [facility])  Under the care of Dr. [name], he is on gabapentin , oxycodone and duloxetine   2. Morbid obesity ([place])  Discussed with the  patient the risk posed by an increased BMI. Discussed importance of portion control, calorie counting and at least 150 minutes of physical activity weekly. Avoid sweet beverages and drink more water. Eat at least 6 servings of fruit and vegetables daily   3. Anterior knee pain, right  - Ambulatory referral to Orthopedic Surgery  4. Moderate persistent asthma with acute exacerbation  We will try switching from [facility] to Trelegy, if no improvement we will try a short course of prednisone , he will call us back if needed  - Fluticasone-Umeclidin-Vilant (TRELEGY ELLIPTA) 100-62.5-25 MCG/INH AEPB; Inhale 1 each into the lungs daily. In place of breo  Dispense: 60 each; Refill: 2  5. Type 2 diabetes mellitus with diabetic polyneuropathy, with long-term current use of insulin ([place])  Under the care of Dr. Honor Junes

## 2020-05-23 DIAGNOSIS — S8391XA Sprain of unspecified site of right knee, initial encounter: Secondary | ICD-10-CM | POA: Diagnosis not present

## 2020-05-24 DIAGNOSIS — G4733 Obstructive sleep apnea (adult) (pediatric): Secondary | ICD-10-CM | POA: Diagnosis not present

## 2020-05-24 DIAGNOSIS — R05 Cough: Secondary | ICD-10-CM | POA: Diagnosis not present

## 2020-05-24 DIAGNOSIS — J4521 Mild intermittent asthma with (acute) exacerbation: Secondary | ICD-10-CM | POA: Diagnosis not present

## 2020-05-24 DIAGNOSIS — R0600 Dyspnea, unspecified: Secondary | ICD-10-CM | POA: Diagnosis not present

## 2020-05-29 ENCOUNTER — Telehealth: Payer: Self-pay | Admitting: Gastroenterology

## 2020-05-29 ENCOUNTER — Ambulatory Visit: Payer: Medicare Other | Admitting: Gastroenterology

## 2020-05-29 NOTE — Telephone Encounter (Signed)
Patient callled to cancel appt./has a migraine and will call back to reschedule.

## 2020-05-29 NOTE — Progress Notes (Deleted)
Primary Care Physician: [name], MD  Primary Gastroenterologist:  Dr. [name]  No chief complaint on file.   HPI: [name] is a 59 y.o. male here for follow-up of his acid reflux.  The patient had last discussed this with me in March 2020 and at that time he was having acid breakthrough.  The patient was recommended to change the timing of his Dexilant and if that did not work we discussed putting him on a H2 blocker before he goes to sleep.  Most of his symptoms at that time were while he was sleeping or when he woke up in the morning.    Past Medical History:  Diagnosis Date  . Allergy    dust, seasonal (worse in the fall).  . 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 Followed by Pain Specialist in [place], back and neck  . Asthma    BRONCHITIS  . Cataract    First Dx in 2012  . Chronic combined systolic and diastolic congestive heart failure ([place])    a. 03/2018 Echo: EF 30-35%, ant, antlat, apical AK, Gr1 DD; b. 07/2018 Echo: EF 35-40%, anteroseptal, apical, and ant HK. Gr1 DD; c. 09/2019 TEE: EF 40-45%.  . Coronary artery disease    a. Prior Ant MI->s/p multiple stents placed in the LAD and right coronary artery ([place]); b. 2016 Cath: reportedly nonobs dzs;  c. 04/2018 Cath/PCI: LM nl, LAD 20p, patent mid stent, LCX 7m(3.25x15 Sierra DES), OM1 nl, OM2 50, OM3 40 w/ patent stent, RCA 40p, 76m, 40d w/ patent stent in RPDA, RPAV 60, EF 25-35%. 2+MR; d. 02/2019 MV: Apical scar, no isch, EF 30-44%.  . Diabetes mellitus without complication ([place])    TYPE 2  . Diabetic peripheral neuropathy (HCC)    feet and hands  . FUO (fever of unknown origin) 08/03/2018  . GERD (gastroesophageal reflux disease)   . Headache    muscle tension  . Hyperlipidemia   . Hypertension    CONTROLLED ON MEDS  . Insomnia   . Ischemic cardiomyopathy    a. 03/2018 Echo: EF 30-35%, ant, antlat, apical AK, Gr1 DD, mild MR, mildly dil LA; b. 07/2018 Echo: EF 35-40%; c. 09/2018 TEE: EF 40-45%, antsept,  ant HK.  [name] Knee pain, acute 05/06/2020  . Left arm weakness 10/04/2019  . Left leg weakness 12/01/2019  . Lyme disease    Chronic  . Myasthenia gravis ([place])   . Myocardial infarction ([place]) 2010  . Seasonal allergies   . Sepsis ([place])    a.07/2018 - unknown source. TEE neg for veg 09/2019.  [name] Sleep apnea    CPAP    Current Outpatient Medications  Medication Sig Dispense Refill  . albuterol (PROVENTIL HFA;VENTOLIN HFA) 108 (90 Base) MCG/ACT inhaler Inhale 2 puffs into the lungs every 6 (six) hours as needed for wheezing or shortness of breath.     [name] albuterol (PROVENTIL) (2.5 MG/3ML) 0.083% nebulizer solution Take 2.5 mg by nebulization every 6 (six) hours as needed for wheezing or shortness of breath. (Patient not taking: Reported on 05/06/2020)    . aspirin EC 81 MG tablet Take 1 tablet (81 mg total) by mouth daily. 90 tablet 3  . betamethasone valerate ointment (VALISONE) 0.1 % Apply topically.    . cholecalciferol (VITAMIN D3) 25 MCG (1000 UT) tablet Take 2,000 Units by mouth daily.    . clopidogrel (PLAVIX) 75 MG tablet Take 1 tablet (75 mg total) by mouth daily with breakfast. 30 tablet  5  . cyclobenzaprine (FLEXERIL) 10 MG tablet Take 10 mg by mouth 2 (two) times daily as needed.    [name] dexlansoprazole (DEXILANT) 60 MG capsule Take 1 capsule (60 mg total) by mouth daily. 30 capsule 11  . DULoxetine (CYMBALTA) 60 MG capsule TAKE 1 CAPSULE(60 MG) BY MOUTH DAILY 90 capsule 0  . ezetimibe (ZETIA) 10 MG tablet Take 1 tablet (10 mg total) by mouth daily. 90 tablet 3  . Fluticasone-Umeclidin-Vilant (TRELEGY ELLIPTA) 100-62.5-25 MCG/INH AEPB Inhale 1 each into the lungs daily. In place of breo 60 each 2  . furosemide (LASIX) 20 MG tablet Take 20 mg by mouth every other day.    . gabapentin (NEURONTIN) 300 MG capsule Take 1 capsule (300 mg total) by mouth 3 (three) times daily. 90 capsule 1  . gabapentin (NEURONTIN) 800 MG tablet Take 800 mg by mouth at bedtime.    . insulin lispro (HUMALOG  KWIKPEN) 100 UNIT/ML KiwkPen Inject 7-8 Units into the skin 3 (three) times daily. Reported on 12/02/2015/ sliding scale 1 unit for every 8 units of carbs; and 1 unit for every 20 above 120    . Insulin Pen Needle (FIFTY50 PEN NEEDLES) 32G X 4 MM MISC Inject 1 each into the skin as needed.    [name] LANTUS SOLOSTAR 100 UNIT/ML Solostar Pen Inject 25-30 Units into the skin daily at 10 pm.   0  . levocetirizine (XYZAL) 5 MG tablet TAKE 1 TABLET(5 MG) BY MOUTH EVERY EVENING 30 tablet 3  . levothyroxine (SYNTHROID) 25 MCG tablet TAKE 1 TABLET(25 MCG) BY MOUTH DAILY BEFORE BREAKFAST 90 tablet 0  . liraglutide (VICTOZA) 18 MG/3ML SOPN 1.8 units every morning (Patient taking differently: Inject 1.8 mg into the skin daily. )    . loratadine (CLARITIN) 10 MG tablet Take 10 mg by mouth daily.     [name] losartan (COZAAR) 25 MG tablet TAKE 1 TABLET(25 MG) BY MOUTH DAILY 30 tablet 5  . metFORMIN (GLUCOPHAGE) 1000 MG tablet Take 1,000 mg by mouth 2 (two) times daily with a meal.    . metoprolol succinate (TOPROL-XL) 100 MG 24 hr tablet Take 1 tablet (100 mg total) by mouth daily. Take with or immediately following a meal. 90 tablet 3  . montelukast (SINGULAIR) 10 MG tablet Take 10 mg by mouth at bedtime.    . Multiple Vitamins-Minerals (MENS MULTI VITAMIN & MINERAL PO) Take 1 tablet by mouth daily.    . naloxone (NARCAN) nasal spray 4 mg/0.1 mL For excess sedation from opioids (Patient not taking: Reported on 05/17/2020) 1 kit 2  . nitroGLYCERIN (NITROSTAT) 0.3 MG SL tablet Place 1 tablet (0.3 mg total) under the tongue every 5 (five) minutes as needed for chest pain. Maximum 3 doses. (Patient not taking: Reported on 05/17/2020) 25 tablet 3  . oxyCODONE (ROXICODONE) 5 MG immediate release tablet Take 2 tablets (10 mg total) by mouth 4 (four) times daily. 240 tablet 0  . [START ON 06/11/2020] oxyCODONE (ROXICODONE) 5 MG immediate release tablet Take 2 tablets (10 mg total) by mouth 4 (four) times daily. 240 tablet 0  .  pyridostigmine (MESTINON) 60 MG tablet Take 1 tablet by mouth 3 (three) times daily.    . rosuvastatin (CRESTOR) 5 MG tablet Take 5 mg by mouth daily.  0  . tamsulosin (FLOMAX) 0.4 MG CAPS capsule 0.8 mg daily 60 capsule 2   No current facility-administered medications for this visit.    Allergies as of 05/29/2020 - Review Complete 05/17/2020  Allergen  Reaction Noted  . Azathioprine Other (See Comments) 11/16/2018  . Novolog [insulin aspart] Hives 09/24/2015    ROS:  General: Negative for anorexia, weight loss, fever, chills, fatigue, weakness. ENT: Negative for hoarseness, difficulty swallowing , nasal congestion. CV: Negative for chest pain, angina, palpitations, dyspnea on exertion, peripheral edema.  Respiratory: Negative for dyspnea at rest, dyspnea on exertion, cough, sputum, wheezing.  GI: See history of present illness. GU:  Negative for dysuria, hematuria, urinary incontinence, urinary frequency, nocturnal urination.  Endo: Negative for unusual weight change.    Physical Examination:   There were no vitals taken for this visit.  General: Well-nourished, well-developed in no acute distress.  Eyes: No icterus. Conjunctivae pink. Lungs: Clear to auscultation bilaterally. Non-labored. Heart: Regular rate and rhythm, no murmurs rubs or gallops.  Abdomen: Bowel sounds are normal, nontender, nondistended, no hepatosplenomegaly or masses, no abdominal bruits or hernia , no rebound or guarding.   Extremities: No lower extremity edema. No clubbing or deformities. Neuro: Alert and oriented x 3.  Grossly intact. Skin: Warm and dry, no jaundice.   Psych: Alert and cooperative, normal mood and affect.  Labs:    Imaging Studies: No results found.  Assessment and Plan:   [name] is a 59 y.o. y/o male ***     [name], MD. Marval Regal    Note: This dictation was prepared with Dragon dictation along with smaller phrase technology. Any transcriptional errors that result  from this process are unintentional.

## 2020-06-01 ENCOUNTER — Other Ambulatory Visit: Payer: Self-pay | Admitting: Urology

## 2020-06-08 ENCOUNTER — Other Ambulatory Visit: Payer: Self-pay | Admitting: Cardiovascular Disease

## 2020-06-11 ENCOUNTER — Telehealth: Payer: Self-pay

## 2020-06-11 ENCOUNTER — Telehealth: Payer: Self-pay | Admitting: *Deleted

## 2020-06-11 ENCOUNTER — Other Ambulatory Visit: Payer: Self-pay | Admitting: *Deleted

## 2020-06-11 NOTE — Telephone Encounter (Signed)
Lori took care of this.

## 2020-06-11 NOTE — Telephone Encounter (Signed)
[facility] [place] doesn't have enough oxycodone to fill his script but you can send it to the [facility] in [place], they have it.

## 2020-06-12 ENCOUNTER — Other Ambulatory Visit: Payer: Self-pay | Admitting: *Deleted

## 2020-06-12 ENCOUNTER — Telehealth: Payer: Self-pay | Admitting: *Deleted

## 2020-06-12 MED ORDER — OXYCODONE HCL 5 MG PO TABS: 10.0000 mg | ORAL_TABLET | Freq: Four times a day (QID) | ORAL | 0 refills | Status: DC

## 2020-06-12 NOTE — Addendum Note (Signed)
Addended by: Molli Barrows on: 06/12/2020 03:58 PM   Modules accepted: Orders

## 2020-06-12 NOTE — Telephone Encounter (Signed)
Dr [name] notified again to resend script.

## 2020-06-12 NOTE — Telephone Encounter (Signed)
He called back to see if Dr. [name] had called in his medicine yet.

## 2020-06-12 NOTE — Telephone Encounter (Signed)
Dr [name] was notified by his nurse today.

## 2020-06-13 ENCOUNTER — Telehealth: Payer: Self-pay | Admitting: *Deleted

## 2020-06-13 ENCOUNTER — Telehealth: Payer: Self-pay

## 2020-06-13 ENCOUNTER — Other Ambulatory Visit: Payer: Self-pay | Admitting: Student in an Organized Health Care Education/Training Program

## 2020-06-13 MED ORDER — OXYCODONE HCL 5 MG PO TABS: 10.0000 mg | ORAL_TABLET | Freq: Four times a day (QID) | ORAL | 0 refills | Status: DC

## 2020-06-13 NOTE — Telephone Encounter (Signed)
The patients wife called to see if the medication issue has been addressed and to make sure you send it to [facility] instead of [place].

## 2020-06-13 NOTE — Progress Notes (Signed)
See nursing note regarding oxycodone not being available at patient's primary pharmacy.  New prescription sent to secondary pharmacy.

## 2020-06-13 NOTE — Telephone Encounter (Signed)
Dr. Holley Raring has e-scribed the oxycodone script to [facility] per Dr. [name] request. Shari Prows [facility] called and the script from yesterday was cancelled- spoke with Destiny. Patient called and informed that script was sent to [facility].

## 2020-06-13 NOTE — Telephone Encounter (Signed)
I have sent Dr. Holley Raring a message to see if he will send it in Dr. [name] absence.

## 2020-06-13 NOTE — Telephone Encounter (Signed)
Patient notified per voicemail that prescription for Oxycodone was sent to [facility] in [place].

## 2020-06-18 ENCOUNTER — Encounter: Payer: Self-pay | Admitting: Internal Medicine

## 2020-06-18 ENCOUNTER — Other Ambulatory Visit: Payer: Self-pay

## 2020-06-18 ENCOUNTER — Ambulatory Visit (INDEPENDENT_AMBULATORY_CARE_PROVIDER_SITE_OTHER): Payer: Medicare Other | Admitting: Internal Medicine

## 2020-06-18 DIAGNOSIS — I5022 Chronic systolic (congestive) heart failure: Secondary | ICD-10-CM

## 2020-06-18 DIAGNOSIS — Z794 Long term (current) use of insulin: Secondary | ICD-10-CM

## 2020-06-18 DIAGNOSIS — J4541 Moderate persistent asthma with (acute) exacerbation: Secondary | ICD-10-CM

## 2020-06-18 DIAGNOSIS — E1142 Type 2 diabetes mellitus with diabetic polyneuropathy: Secondary | ICD-10-CM | POA: Diagnosis not present

## 2020-06-18 MED ORDER — PREDNISONE 10 MG PO TABS: ORAL_TABLET | ORAL | 0 refills | Status: DC

## 2020-06-18 MED ORDER — AZITHROMYCIN 250 MG PO TABS: ORAL_TABLET | ORAL | 0 refills | Status: DC

## 2020-06-18 NOTE — Progress Notes (Signed)
Name: [name]   MRN: [id number]    DOB: 1960/10/09   Date:06/18/2020       Progress Note  Subjective  Chief Complaint  Chief Complaint  Patient presents with  . Cough    he states he has bad hay fever and asthma, he says every year this happens to him and a course of steriod and abx usually helps to clear him up  . Chest congestion    feels like congestion is stuck in his chest and he can't get it up    I connected with  [name] on 06/18/20 at 11:20 AM EDT by telephone and verified that I am speaking with the correct person using two identifiers.  I discussed the limitations, risks, security and privacy concerns of performing an evaluation and management service by telephone and the availability of in person appointments. The patient expressed understanding and agreed to proceed. Staff also discussed with the patient that there may be a patient responsible charge related to this service. Patient Location: Home Provider Location: [facility] Additional Individuals present: none  HPI  Patient is a 59 year old male patient of Dr. Ancil Boozer Patient was last seen by her 05/17/2020 At that visit he noted nasal congestion, dry cough and wheezing with concerns for an asthma flare. 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 Had Covid vaccine  + cough, min production, feels stuck in chest, when does come up is yellow-green, no blood Also notes some increased wheezing at times, No marked SOB, + SOB at times noted No fever,  97.1 on check this am No sore throat.  Min sinus congestion No loss of smell, loss of taste No N/V Minimal muscle aches No marked loose stools/diarrhea No CP, passing out episodes Trying OTC mucinex, alka seltzer cold medicine Uses trelegy and alb rescue inhaler - 2-3 times a  day  Positive asthma history, gets hayfever this time of year  Two weeks ago, saw pulmonologist and took small dose of steroids - $RemoveBe'10mg'DXNKNBEGD$  and 40/30/20/10 each for two days and Z-Pak and helped, not completely resolve, and 3 days after stopped steroid, sx's came back again.  He noted that Dr. Ancil Boozer in the past has given him a little more prolonged course of the steroids and antibiotic regimen to help.  He is aware of the concerns with steroids and blood sugar control.  + DM, + CHF + morbid obesity Wt Readings from Last 3 Encounters:  05/17/20 250 lb 3.2 oz (113.5 kg)  02/20/20 245 lb (111.1 kg)  02/12/20 250 lb (113.4 kg)      Patient Active Problem List   Diagnosis Date Noted  . Lung involvement associated with another disorder ([place]) 05/17/2020  . Left leg weakness 12/01/2019  . Left arm weakness 10/04/2019  . Lower urinary tract symptoms 09/15/2019  . Incomplete bladder emptying 09/15/2019  . Carpal tunnel syndrome on both sides 07/12/2019  . Weakness generalized 06/21/2019  . Idiopathic peripheral neuropathy 04/21/2019  . Sensory ataxia 03/09/2019  . Colitis 07/08/2018  . OSA on CPAP 07/08/2018  . Congestive heart failure (CHF) ([place]) 05/05/2018  . Unstable angina ([place]) 04/26/2018  . Ischemic cardiomyopathy 04/26/2018  . Chronic combined systolic and diastolic CHF (congestive heart failure) ([place]) 04/26/2018  . Effort angina ([place]) 04/25/2018  . 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  [place]) 01/25/2018  . Hyperlipidemia due to type 2 diabetes mellitus ([place]) 08/12/2017  . Vitamin D deficiency, unspecified 08/12/2017  . Ptosis of left eyelid 01/08/2017  . Dermatitis 12/24/2016  . Difficulty walking 04/27/2016  . Foot cramps 04/27/2016  . Morbid (severe) obesity due to excess calories ([place]) 04/06/2016  . Benign neoplasm of sigmoid colon   . Benign neoplasm of descending colon   . Benign neoplasm of transverse colon   . Coronary artery disease involving native coronary  artery with angina pectoris ([place]) 10/22/2015  . Coronary artery disease 08/22/2015  . DM (diabetes mellitus), type 2, uncontrolled with complications ([place]) 14/78/2956  . Myasthenia gravis ([place]) 08/22/2015  . Chronic left-sided low back pain 08/22/2015  . Hypertension 08/22/2015  . GERD (gastroesophageal reflux disease) 08/22/2015  . Arthritis 08/22/2015  . Diabetic peripheral neuropathy ([place]) 08/22/2015  . Hyperlipidemia 08/22/2015  . Insomnia 08/22/2015  . Type 2 diabetes mellitus with diabetic polyneuropathy, with long-term current use of insulin ([place]) 08/22/2015  . Asthma 08/22/2015  . Lyme disease 06/05/1992    Past Surgical History:  Procedure Laterality Date  . BILATERAL CARPAL TUNNEL RELEASE Bilateral L in 2012 and R in 2013  . CARDIAC CATHETERIZATION     Several Caths, most recent in  March 2016.  [name] COLONOSCOPY WITH PROPOFOL N/A 01/10/2016   Procedure: COLONOSCOPY WITH PROPOFOL;  Surgeon: [name], MD;  Location: [facility] ENDOSCOPY;  Service: Endoscopy;  Laterality: N/A;  . CORONARY ANGIOPLASTY    . CORONARY STENT INTERVENTION N/A 04/25/2018   Procedure: CORONARY STENT INTERVENTION;  Surgeon: [name], MD;  Location: [facility];  Service: Cardiovascular;  Laterality: N/A;  . ESOPHAGOGASTRODUODENOSCOPY (EGD) WITH PROPOFOL N/A 01/10/2016   Procedure: ESOPHAGOGASTRODUODENOSCOPY (EGD) WITH PROPOFOL;  Surgeon: [name], MD;  Location: [facility] ENDOSCOPY;  Service: Endoscopy;  Laterality: N/A;  . EYE SURGERY Bilateral 2012   cataract/bilateral vitrectomies  . LEFT HEART CATH AND CORONARY ANGIOGRAPHY Left 04/25/2018   Procedure: LEFT HEART CATH AND CORONARY ANGIOGRAPHY;  Surgeon: [name], MD;  Location: [facility];  Service: Cardiovascular;  Laterality: Left;  . TEE WITHOUT CARDIOVERSION N/A 09/05/2018   Procedure: TRANSESOPHAGEAL ECHOCARDIOGRAM (TEE);  Surgeon: [name], MD;  Location: [facility] ORS;  Service: Cardiovascular;  Laterality: N/A;  .  TONSILLECTOMY AND ADENOIDECTOMY     As a child  . TUNNELED VENOUS CATHETER PLACEMENT     removed    Family History  Problem Relation Age of Onset  . Diabetes Mother   . Heart disease Mother   . Cancer Father        Prostate CA, Anal cancer   . Dementia Father   . Diabetes Brother   . Healthy Brother   . Healthy Brother     Social History   Tobacco Use  . Smoking status: Never Smoker  . Smokeless tobacco: Never Used  . Tobacco comment: smoking cessation materials not required  Substance Use Topics  . Alcohol use: Not Currently    Alcohol/week: 0.0 standard drinks     Current Outpatient Medications:  .  albuterol (PROVENTIL HFA;VENTOLIN HFA) 108 (90 Base) MCG/ACT inhaler, Inhale 2 puffs into the lungs every 6 (six) hours as needed for wheezing or shortness of breath. , Disp: , Rfl:  .  aspirin EC 81 MG tablet, Take 1 tablet (81 mg total) by mouth daily., Disp: 90 tablet, Rfl: 3 .  clopidogrel (PLAVIX) 75 MG tablet, Take 1 tablet (75 mg total) by mouth daily with breakfast.,  Disp: 30 tablet, Rfl: 5 .  cyclobenzaprine (FLEXERIL) 10 MG tablet, Take 10 mg by mouth 2 (two) times daily as needed., Disp: , Rfl:  .  dexlansoprazole (DEXILANT) 60 MG capsule, Take 1 capsule (60 mg total) by mouth daily., Disp: 30 capsule, Rfl: 11 .  DULoxetine (CYMBALTA) 60 MG capsule, TAKE 1 CAPSULE(60 MG) BY MOUTH DAILY, Disp: 90 capsule, Rfl: 0 .  ezetimibe (ZETIA) 10 MG tablet, Take 1 tablet (10 mg total) by mouth daily., Disp: 90 tablet, Rfl: 3 .  Fluticasone-Umeclidin-Vilant (TRELEGY ELLIPTA) 100-62.5-25 MCG/INH AEPB, Inhale 1 each into the lungs daily. In place of breo, Disp: 60 each, Rfl: 2 .  furosemide (LASIX) 20 MG tablet, Take 20 mg by mouth every other day., Disp: , Rfl:  .  gabapentin (NEURONTIN) 300 MG capsule, Take 1 capsule (300 mg total) by mouth 3 (three) times daily., Disp: 90 capsule, Rfl: 1 .  gabapentin (NEURONTIN) 800 MG tablet, Take 800 mg by mouth at bedtime., Disp: , Rfl:  .   insulin lispro (HUMALOG KWIKPEN) 100 UNIT/ML KiwkPen, Inject 7-8 Units into the skin 3 (three) times daily. Reported on 12/02/2015/ sliding scale 1 unit for every 8 units of carbs; and 1 unit for every 20 above 120, Disp: , Rfl:  .  Insulin Pen Needle (FIFTY50 PEN NEEDLES) 32G X 4 MM MISC, Inject 1 each into the skin as needed., Disp: , Rfl:  .  LANTUS SOLOSTAR 100 UNIT/ML Solostar Pen, Inject 25-30 Units into the skin daily at 10 pm. , Disp: , Rfl: 0 .  levocetirizine (XYZAL) 5 MG tablet, TAKE 1 TABLET(5 MG) BY MOUTH EVERY EVENING, Disp: 30 tablet, Rfl: 3 .  levothyroxine (SYNTHROID) 25 MCG tablet, TAKE 1 TABLET(25 MCG) BY MOUTH DAILY BEFORE BREAKFAST, Disp: 90 tablet, Rfl: 0 .  liraglutide (VICTOZA) 18 MG/3ML SOPN, 1.8 units every morning (Patient taking differently: Inject 1.8 mg into the skin daily. ), Disp: , Rfl:  .  loratadine (CLARITIN) 10 MG tablet, Take 10 mg by mouth daily. , Disp: , Rfl:  .  losartan (COZAAR) 25 MG tablet, TAKE 1 TABLET(25 MG) BY MOUTH DAILY, Disp: 30 tablet, Rfl: 1 .  metFORMIN (GLUCOPHAGE) 1000 MG tablet, Take 1,000 mg by mouth 2 (two) times daily with a meal., Disp: , Rfl:  .  metoprolol succinate (TOPROL-XL) 100 MG 24 hr tablet, Take 1 tablet (100 mg total) by mouth daily. Take with or immediately following a meal., Disp: 90 tablet, Rfl: 3 .  montelukast (SINGULAIR) 10 MG tablet, Take 10 mg by mouth at bedtime., Disp: , Rfl:  .  Multiple Vitamins-Minerals (MENS MULTI VITAMIN & MINERAL PO), Take 1 tablet by mouth daily., Disp: , Rfl:  .  oxyCODONE (ROXICODONE) 5 MG immediate release tablet, Take 2 tablets (10 mg total) by mouth 4 (four) times daily., Disp: 240 tablet, Rfl: 0 .  pyridostigmine (MESTINON) 60 MG tablet, Take 1 tablet by mouth 3 (three) times daily., Disp: , Rfl:  .  rosuvastatin (CRESTOR) 5 MG tablet, Take 5 mg by mouth daily., Disp: , Rfl: 0 .  albuterol (PROVENTIL) (2.5 MG/3ML) 0.083% nebulizer solution, Take 2.5 mg by nebulization every 6 (six) hours as  needed for wheezing or shortness of breath. (Patient not taking: Reported on 05/06/2020), Disp: , Rfl:  .  betamethasone valerate ointment (VALISONE) 0.1 %, Apply topically. (Patient not taking: Reported on 06/18/2020), Disp: , Rfl:  .  naloxone (NARCAN) nasal spray 4 mg/0.1 mL, For excess sedation from opioids (Patient not taking: Reported  on 05/17/2020), Disp: 1 kit, Rfl: 2 .  nitroGLYCERIN (NITROSTAT) 0.3 MG SL tablet, Place 1 tablet (0.3 mg total) under the tongue every 5 (five) minutes as needed for chest pain. Maximum 3 doses. (Patient not taking: Reported on 05/17/2020), Disp: 25 tablet, Rfl: 3 .  tamsulosin (FLOMAX) 0.4 MG CAPS capsule, TAKE 2 CAPSULES BY MOUTH DAILY (Patient not taking: Reported on 06/18/2020), Disp: 60 capsule, Rfl: 2  Allergies  Allergen Reactions  . Azathioprine Other (See Comments)    Azathioprine hypersensitivity reaction  . Novolog [Insulin Aspart] Hives    With staff assistance, above reviewed with the patient today.  ROS: As per HPI, otherwise no specific complaints on a limited and focused system review   Objective  Virtual encounter, vitals not obtained.  There is no height or weight on file to calculate BMI.  Physical Exam   Appears in NAD via conversation, and no coughing episodes during our phone conversation Breathing: No obvious respiratory distress. Speaking in complete sentences Neurological: Pt is alert and Speech is normal Psychiatric: Patient has a normal mood and affect, behavior is normal. Judgment and thought content normal.   No results found for this or any previous visit (from the past 72 hour(s)).  PHQ2/9: Depression screen [facility] 2/9 06/18/2020 05/17/2020 02/20/2020 02/02/2020 10/16/2019  Decreased Interest 0 0 0 1 1  Down, Depressed, Hopeless 0 0 0 1 1  PHQ - 2 Score 0 0 0 2 2  Altered sleeping - 0 - 3 3  Tired, decreased energy - 0 - 2 1  Change in appetite - 1 - 3 2  Feeling bad or failure about yourself  - 0 - 0 0  Trouble  concentrating - 0 - 1 1  Moving slowly or fidgety/restless - 0 - 0 0  Suicidal thoughts - 0 - 0 0  PHQ-9 Score - 1 - 11 9  Difficult doing work/chores - Not difficult at all - Somewhat difficult Somewhat difficult  Some recent data might be hidden   PHQ-2/9 Result reviewed  Fall Risk: Fall Risk  06/18/2020 05/17/2020 02/20/2020 02/12/2020 02/02/2020  Falls in the past year? $RemoveBe'1 1 1 1 '[id number]$ 0  Comment - - - - -  Number falls in past yr: 0 1 - 0 0  Comment - - - - -  Injury with Fall? 0 1 0 0 0  Risk Factor Category  - - - - -  Risk for fall due to : - - - - -  Risk for fall due to: Comment - - - - -  Follow up Falls evaluation completed - - - -     Assessment & Plan  1. Moderate persistent asthmatic bronchitis with acute exacerbation The patient seemed convinced this was more of a bronchitis/flare of his asthma like he has had previously, especially this time of year, and did get treated recently by pulmonary a couple weeks ago for this concern.  It was helpful, although symptoms returned shortly after the steroid was completed.  He noted in the past he has had slightly longer courses which may have been a factor. Discussed at length concerns for potential Covid source, as we are in a pandemic now, and he noted he has had the vaccine.  Has not lost taste or smell, no fevers.  Still noted this possibility has to be a consideration, especially if not getting better with the treatment recommendations, or if more concerning symptoms arise as we discussed. It is very challenging assessing by phone,  with the limitations noted, and he does have a significant cardiac history including heart failure.  Does seem more likely related to an asthmatic bronchitis, although he was aware that heart failure can contribute to symptoms including wheeze. Felt best to proceed as follows: We will resume the steroid course, Also will add a second Z-Pak for a prolonged course to assess. Would use his albuterol inhaler  regularly 3 times a day to help with clearance, and as improving return to just as needed Also a Mucinex or Robitussin type product as an expectorant can be helpful and recommended Continue his Trelegy inhaler as well. If symptoms not improving despite the above, and especially if he develops more concerning symptoms like fevers, loss of taste or smell, more cough, recommended he do get tested for Covid and he was understanding of that. Emphasized the importance of following up if not improving or more problematic, and if having higher fevers, increased shortness of breath, chest pains, or more concerning symptoms, he should be seen more urgently/emergently in an ER setting.  - predniSONE (DELTASONE) 10 MG tablet; Take four tabs daily x 3 days, then two tabs daily X 3 days, then one tab daily x 3 days  Dispense: 21 tablet; Refill: 0 - azithromycin (ZITHROMAX) 250 MG tablet; Take 2 tablets on day 1, then 1 tablet daily for the next 4 days  Dispense: 6 tablet; Refill: 0  2. Type 2 diabetes mellitus with diabetic polyneuropathy, with long-term current use of insulin ([place]) He is aware how his sugars will increase with the steroid, and a weaning course prescribed.  3. Chronic systolic CHF (congestive heart failure) ([place]) As above, can be a contributor to symptoms, and to closely monitor presently.   I discussed the assessment and treatment plan with the patient. The patient was provided an opportunity to ask questions and all were answered. The patient agreed with the plan and demonstrated an understanding of the instructions.  Red flags and when to present for emergency care or RTC including fevers, chest pain, shortness of breath, new/worsening/un-resolving symptoms reviewed with patient at time of visit.   The patient was advised to call back or seek an in-person evaluation if the symptoms worsen or if the condition fails to improve as anticipated.  I provided 20 minutes of non-face-to-face time  during this encounter that included discussing at length patient's sx/history, pertinent pmhx, medications, treatment and follow up plan. This time also included the necessary documentation, orders, and chart review.  Towanda Malkin, MD

## 2020-07-05 ENCOUNTER — Other Ambulatory Visit: Payer: Self-pay

## 2020-07-05 ENCOUNTER — Ambulatory Visit: Payer: Medicare Other | Attending: Anesthesiology | Admitting: Anesthesiology

## 2020-07-05 DIAGNOSIS — M5432 Sciatica, left side: Secondary | ICD-10-CM

## 2020-07-05 DIAGNOSIS — G894 Chronic pain syndrome: Secondary | ICD-10-CM | POA: Diagnosis not present

## 2020-07-05 DIAGNOSIS — A6923 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: Secondary | ICD-10-CM

## 2020-07-05 DIAGNOSIS — M47817 Spondylosis without myelopathy or radiculopathy, lumbosacral region: Secondary | ICD-10-CM

## 2020-07-05 DIAGNOSIS — M51369 Other intervertebral disc degeneration, lumbar region without mention of lumbar back pain or lower extremity pain: Secondary | ICD-10-CM

## 2020-07-05 DIAGNOSIS — M5136 Other intervertebral disc degeneration, lumbar region: Secondary | ICD-10-CM | POA: Diagnosis not present

## 2020-07-05 DIAGNOSIS — F1190 Opioid use, unspecified, uncomplicated: Secondary | ICD-10-CM

## 2020-07-05 DIAGNOSIS — M542 Cervicalgia: Secondary | ICD-10-CM

## 2020-07-05 DIAGNOSIS — R29898 Other symptoms and signs involving the musculoskeletal system: Secondary | ICD-10-CM

## 2020-07-05 DIAGNOSIS — M4716 Other spondylosis with myelopathy, lumbar region: Secondary | ICD-10-CM | POA: Diagnosis not present

## 2020-07-07 MED ORDER — OXYCODONE HCL 5 MG PO TABS: 10.0000 mg | ORAL_TABLET | Freq: Four times a day (QID) | ORAL | 0 refills | Status: AC | PRN

## 2020-07-07 MED ORDER — OXYCODONE HCL 5 MG PO TABS: 10.0000 mg | ORAL_TABLET | Freq: Four times a day (QID) | ORAL | 0 refills | Status: AC

## 2020-07-07 NOTE — Progress Notes (Signed)
Virtual Visit via Telephone Note  I connected with [name] on 07/07/20 at  9:30 AM EDT by telephone and verified that I am speaking with the correct person using two identifiers.  Location: Patient: Home Provider: [facility]   I discussed the limitations, risks, security and privacy concerns of performing an evaluation and management service by telephone and the availability of in person appointments. I also discussed with the patient that there may be a patient responsible charge related to this service. The patient expressed understanding and agreed to proceed.   History of Present Illness: I spoke with him mother via telephone today as we were unable to link up for the video portion of the virtual conference but he reports that he has done well with previous epidurals and feels that he is due for a repeat epidural.  He generally gets 75 to 80% relief of his low back and almost complete resolution of his lower extremity pain and sciatic symptoms for about 2 to 3 months.  His last epidural was in May.  He is having some recurrence of the same left leg greater than right leg sciatica.  He also has some chronic intermittent lower extremity weakness primarily of a give way style.  No change in bowel or bladder function is noted at this time.  He takes his oxycodone 10 mg tablets 4 times a day for refractory pain control and has been on this for a considerable period of time.  He does inquire as to options regarding a reduction in opioid dosing and whether there are any other procedures that would facilitate this.  Based on our discussion today he continues to derive good functional relief with the opioid medications and based on his report has failed to gain any significant relief with physical therapy and other nonopioid medications.  He has gotten good relief with the epidurals and facet blocks historically.  Otherwise no changes are noted in his status.   Observations/Objective:  Current Outpatient Medications:  .  albuterol (PROVENTIL HFA;VENTOLIN HFA) 108 (90 Base) MCG/ACT inhaler, Inhale 2 puffs into the lungs every 6 (six) hours as needed for wheezing or shortness of breath. , Disp: , Rfl:  .  albuterol (PROVENTIL) (2.5 MG/3ML) 0.083% nebulizer solution, Take 2.5 mg by nebulization every 6 (six) hours as needed for wheezing or shortness of breath. (Patient not taking: Reported on 05/06/2020), Disp: , Rfl:  .  aspirin EC 81 MG tablet, Take 1 tablet (81 mg total) by mouth daily., Disp: 90 tablet, Rfl: 3 .  azithromycin (ZITHROMAX) 250 MG tablet, Take 2 tablets on day 1, then 1 tablet daily for the next 4 days, Disp: 6 tablet, Rfl: 0 .  betamethasone valerate ointment (VALISONE) 0.1 %, Apply topically. (Patient not taking: Reported on 06/18/2020), Disp: , Rfl:  .  clopidogrel (PLAVIX) 75 MG tablet, Take 1 tablet (75 mg total) by mouth daily with breakfast., Disp: 30 tablet, Rfl: 5 .  cyclobenzaprine (FLEXERIL) 10 MG tablet, Take 10 mg by mouth 2 (two) times daily as needed., Disp: , Rfl:  .  dexlansoprazole (DEXILANT) 60 MG capsule, Take 1 capsule (60 mg total) by mouth daily., Disp: 30 capsule, Rfl: 11 .  DULoxetine (CYMBALTA) 60 MG capsule, TAKE 1 CAPSULE(60 MG) BY MOUTH DAILY, Disp: 90 capsule, Rfl: 0 .  ezetimibe (ZETIA) 10 MG tablet, Take 1 tablet (10 mg total) by mouth daily., Disp: 90 tablet, Rfl: 3 .  Fluticasone-Umeclidin-Vilant (TRELEGY ELLIPTA) 100-62.5-25 MCG/INH AEPB, Inhale 1 each into  the lungs daily. In place of breo, Disp: 60 each, Rfl: 2 .  furosemide (LASIX) 20 MG tablet, Take 20 mg by mouth every other day., Disp: , Rfl:  .  gabapentin (NEURONTIN) 300 MG capsule, Take 1 capsule (300 mg total) by mouth 3 (three) times daily., Disp: 90 capsule, Rfl: 1 .  gabapentin (NEURONTIN) 800 MG tablet, Take 800 mg by mouth at bedtime., Disp: , Rfl:  .  insulin lispro (HUMALOG KWIKPEN) 100 UNIT/ML KiwkPen, Inject 7-8 Units into the skin 3  (three) times daily. Reported on 12/02/2015/ sliding scale 1 unit for every 8 units of carbs; and 1 unit for every 20 above 120, Disp: , Rfl:  .  Insulin Pen Needle (FIFTY50 PEN NEEDLES) 32G X 4 MM MISC, Inject 1 each into the skin as needed., Disp: , Rfl:  .  LANTUS SOLOSTAR 100 UNIT/ML Solostar Pen, Inject 25-30 Units into the skin daily at 10 pm. , Disp: , Rfl: 0 .  levocetirizine (XYZAL) 5 MG tablet, TAKE 1 TABLET(5 MG) BY MOUTH EVERY EVENING, Disp: 30 tablet, Rfl: 3 .  levothyroxine (SYNTHROID) 25 MCG tablet, TAKE 1 TABLET(25 MCG) BY MOUTH DAILY BEFORE BREAKFAST, Disp: 90 tablet, Rfl: 0 .  liraglutide (VICTOZA) 18 MG/3ML SOPN, 1.8 units every morning (Patient taking differently: Inject 1.8 mg into the skin daily. ), Disp: , Rfl:  .  loratadine (CLARITIN) 10 MG tablet, Take 10 mg by mouth daily. , Disp: , Rfl:  .  losartan (COZAAR) 25 MG tablet, TAKE 1 TABLET(25 MG) BY MOUTH DAILY, Disp: 30 tablet, Rfl: 1 .  metFORMIN (GLUCOPHAGE) 1000 MG tablet, Take 1,000 mg by mouth 2 (two) times daily with a meal., Disp: , Rfl:  .  metoprolol succinate (TOPROL-XL) 100 MG 24 hr tablet, Take 1 tablet (100 mg total) by mouth daily. Take with or immediately following a meal., Disp: 90 tablet, Rfl: 3 .  montelukast (SINGULAIR) 10 MG tablet, Take 10 mg by mouth at bedtime., Disp: , Rfl:  .  Multiple Vitamins-Minerals (MENS MULTI VITAMIN & MINERAL PO), Take 1 tablet by mouth daily., Disp: , Rfl:  .  naloxone (NARCAN) nasal spray 4 mg/0.1 mL, For excess sedation from opioids (Patient not taking: Reported on 05/17/2020), Disp: 1 kit, Rfl: 2 .  nitroGLYCERIN (NITROSTAT) 0.3 MG SL tablet, Place 1 tablet (0.3 mg total) under the tongue every 5 (five) minutes as needed for chest pain. Maximum 3 doses. (Patient not taking: Reported on 05/17/2020), Disp: 25 tablet, Rfl: 3 .  [START ON 07/13/2020] oxyCODONE (ROXICODONE) 5 MG immediate release tablet, Take 2 tablets (10 mg total) by mouth 4 (four) times daily., Disp: 240 tablet,  Rfl: 0 .  [START ON 08/12/2020] oxyCODONE (ROXICODONE) 5 MG immediate release tablet, Take 2 tablets (10 mg total) by mouth every 6 (six) hours as needed for severe pain., Disp: 240 tablet, Rfl: 0 .  predniSONE (DELTASONE) 10 MG tablet, Take four tabs daily x 3 days, then two tabs daily X 3 days, then one tab daily x 3 days, Disp: 21 tablet, Rfl: 0 .  pyridostigmine (MESTINON) 60 MG tablet, Take 1 tablet by mouth 3 (three) times daily., Disp: , Rfl:  .  rosuvastatin (CRESTOR) 5 MG tablet, Take 5 mg by mouth daily., Disp: , Rfl: 0 .  tamsulosin (FLOMAX) 0.4 MG CAPS capsule, TAKE 2 CAPSULES BY MOUTH DAILY (Patient not taking: Reported on 06/18/2020), Disp: 60 capsule, Rfl: 2  Assessment and Plan:  1. Left leg weakness   2. Lumbar spondylosis  with myelopathy   3. DDD (degenerative disc disease), lumbar   4. Chronic pain syndrome   5. Chronic, continuous use of opioids   6. Lyme arthritis of multiple joints (HCC)   7. Cervicalgia   8. Sciatica, left side   9. Left arm weakness   10. Facet arthritis of lumbosacral region   Based on our discussion today and upon review of the [facility] practitioner database information is appropriate to refill his medications.  This will be dated for October 9 and November 8.  No changes in his dosing regimen will be initiated at this time.  I had a long discussion with him about the risks and benefits of chronic opioid maintenance therapy.  He mentions that he is not had any significant success with conservative medication management or physical therapy.  He has tried to continue with weight loss and activity in addition to the physical therapy exercises but continues to have the same low back pain and leg pain.  We have talked about options regarding opioid reduction and other more invasive treatments can assistant with a possible dorsal column stimulator placement and a repeat facet injection with a possible radiofrequency ablation.  He mentions that he has had  radiofrequency ablation in the distant past and responded favorably to this.  I have given him some options to consider and at this point he desires to proceed with a repeat epidural at his next visit in 2 months.  We may then consider a repeat facet injection and look at a possible radiofrequency ablation.  Furthermore he will do some research on dorsal column stimulator placement and consider this and get back with [place] regarding this.  He will continue follow-up with his primary care physicians for his baseline medical care.  We did talk about discontinuing the Plavix prior to his next injection for 7 days and also touching base with his endocrine physician for blood sugar management with the epidural. Follow Up Instructions:    I discussed the assessment and treatment plan with the patient. The patient was provided an opportunity to ask questions and all were answered. The patient agreed with the plan and demonstrated an understanding of the instructions.   The patient was advised to call back or seek an in-person evaluation if the symptoms worsen or if the condition fails to improve as anticipated.  I provided 30 minutes of non-face-to-face time during this encounter.   Molli Barrows, MD

## 2020-07-08 ENCOUNTER — Other Ambulatory Visit: Payer: Self-pay | Admitting: Family Medicine

## 2020-07-08 DIAGNOSIS — F411 Generalized anxiety disorder: Secondary | ICD-10-CM

## 2020-07-08 DIAGNOSIS — F331 Major depressive disorder, recurrent, moderate: Secondary | ICD-10-CM

## 2020-07-09 ENCOUNTER — Other Ambulatory Visit: Payer: Self-pay | Admitting: Cardiovascular Disease

## 2020-07-11 ENCOUNTER — Other Ambulatory Visit: Payer: Self-pay | Admitting: Cardiovascular Disease

## 2020-07-18 ENCOUNTER — Emergency Department: Payer: Medicare Other

## 2020-07-18 ENCOUNTER — Other Ambulatory Visit: Payer: Self-pay

## 2020-07-18 ENCOUNTER — Encounter: Payer: Self-pay | Admitting: Emergency Medicine

## 2020-07-18 DIAGNOSIS — S0511XA Contusion of eyeball and orbital tissues, right eye, initial encounter: Secondary | ICD-10-CM | POA: Diagnosis not present

## 2020-07-18 DIAGNOSIS — S0011XA Contusion of right eyelid and periocular area, initial encounter: Secondary | ICD-10-CM | POA: Diagnosis not present

## 2020-07-18 DIAGNOSIS — E1140 Type 2 diabetes mellitus with diabetic neuropathy, unspecified: Secondary | ICD-10-CM | POA: Insufficient documentation

## 2020-07-18 DIAGNOSIS — R55 Syncope and collapse: Secondary | ICD-10-CM | POA: Diagnosis not present

## 2020-07-18 DIAGNOSIS — Z7982 Long term (current) use of aspirin: Secondary | ICD-10-CM | POA: Insufficient documentation

## 2020-07-18 DIAGNOSIS — S0302XA Dislocation of jaw, left side, initial encounter: Secondary | ICD-10-CM | POA: Diagnosis not present

## 2020-07-18 DIAGNOSIS — X58XXXA Exposure to other specified factors, initial encounter: Secondary | ICD-10-CM | POA: Insufficient documentation

## 2020-07-18 DIAGNOSIS — Z794 Long term (current) use of insulin: Secondary | ICD-10-CM | POA: Insufficient documentation

## 2020-07-18 DIAGNOSIS — R0602 Shortness of breath: Secondary | ICD-10-CM | POA: Diagnosis not present

## 2020-07-18 DIAGNOSIS — I5042 Chronic combined systolic (congestive) and diastolic (congestive) heart failure: Secondary | ICD-10-CM | POA: Insufficient documentation

## 2020-07-18 DIAGNOSIS — J45909 Unspecified asthma, uncomplicated: Secondary | ICD-10-CM | POA: Diagnosis not present

## 2020-07-18 DIAGNOSIS — I110 Hypertensive heart disease with heart failure: Secondary | ICD-10-CM | POA: Insufficient documentation

## 2020-07-18 DIAGNOSIS — Z7984 Long term (current) use of oral hypoglycemic drugs: Secondary | ICD-10-CM | POA: Diagnosis not present

## 2020-07-18 DIAGNOSIS — Z043 Encounter for examination and observation following other accident: Secondary | ICD-10-CM | POA: Diagnosis not present

## 2020-07-18 DIAGNOSIS — Z20822 Contact with and (suspected) exposure to covid-19: Secondary | ICD-10-CM | POA: Diagnosis not present

## 2020-07-18 DIAGNOSIS — Z79899 Other long term (current) drug therapy: Secondary | ICD-10-CM | POA: Diagnosis not present

## 2020-07-18 DIAGNOSIS — I2510 Atherosclerotic heart disease of native coronary artery without angina pectoris: Secondary | ICD-10-CM | POA: Diagnosis not present

## 2020-07-18 DIAGNOSIS — S069X9A Unspecified intracranial injury with loss of consciousness of unspecified duration, initial encounter: Secondary | ICD-10-CM | POA: Diagnosis not present

## 2020-07-18 LAB — CBC
HCT: 31.8 % — ABNORMAL LOW (ref 39.0–52.0)
Hemoglobin: 9.7 g/dL — ABNORMAL LOW (ref 13.0–17.0)
MCH: 25.8 pg — ABNORMAL LOW (ref 26.0–34.0)
MCHC: 30.5 g/dL (ref 30.0–36.0)
MCV: 84.6 fL (ref 80.0–100.0)
Platelets: 239 10*3/uL (ref 150–400)
RBC: 3.76 MIL/uL — ABNORMAL LOW (ref 4.22–5.81)
RDW: 14.5 % (ref 11.5–15.5)
WBC: 5.5 10*3/uL (ref 4.0–10.5)
nRBC: 0 % (ref 0.0–0.2)

## 2020-07-18 LAB — BASIC METABOLIC PANEL
Anion gap: 10 (ref 5–15)
BUN: 20 mg/dL (ref 8–23)
CO2: 26 mmol/L (ref 22–32)
Calcium: 8.8 mg/dL — ABNORMAL LOW (ref 8.9–10.3)
Chloride: 102 mmol/L (ref 98–111)
Creatinine, Ser: 1.08 mg/dL (ref 0.61–1.24)
GFR, Estimated: >60 mL/min (ref >60)
Glucose, Bld: 181 mg/dL — ABNORMAL HIGH (ref 70–99)
Potassium: 4.3 mmol/L (ref 3.5–5.1)
Sodium: 138 mmol/L (ref 135–145)

## 2020-07-18 LAB — GLUCOSE, CAPILLARY: Glucose-Capillary: 174 mg/dL — ABNORMAL HIGH (ref 70–99)

## 2020-07-18 NOTE — ED Triage Notes (Signed)
Pt comes via POV from home with c/o LOC and falling on face in the bathroom. Pt has black eye and is on Plavix.

## 2020-07-18 NOTE — ED Triage Notes (Signed)
Pt comes via POV from home with c/o LOC and then falling on face. Pt has black eye and is on Plavix

## 2020-07-19 ENCOUNTER — Emergency Department
Admission: EM | Admit: 2020-07-19 | Discharge: 2020-07-19 | Disposition: A | Discharge status: Home / Self Care | Payer: Medicare Other | Attending: Emergency Medicine | Admitting: Emergency Medicine

## 2020-07-19 ENCOUNTER — Telehealth: Payer: Self-pay

## 2020-07-19 ENCOUNTER — Emergency Department: Payer: Medicare Other

## 2020-07-19 DIAGNOSIS — H05231 Hemorrhage of right orbit: Secondary | ICD-10-CM

## 2020-07-19 DIAGNOSIS — R55 Syncope and collapse: Secondary | ICD-10-CM

## 2020-07-19 DIAGNOSIS — R0602 Shortness of breath: Secondary | ICD-10-CM | POA: Diagnosis not present

## 2020-07-19 DIAGNOSIS — Z043 Encounter for examination and observation following other accident: Secondary | ICD-10-CM | POA: Diagnosis not present

## 2020-07-19 LAB — URINALYSIS, COMPLETE (UACMP) WITH MICROSCOPIC
Bacteria, UA: NONE SEEN
Bilirubin Urine: NEGATIVE
Glucose, UA: 150 mg/dL — AB
Hgb urine dipstick: NEGATIVE
Ketones, ur: NEGATIVE mg/dL
Leukocytes,Ua: NEGATIVE
Nitrite: NEGATIVE
Protein, ur: NEGATIVE mg/dL
Specific Gravity, Urine: 1.009 (ref 1.005–1.030)
Squamous Epithelial / HPF: NONE SEEN (ref 0–5)
pH: 6 (ref 5.0–8.0)

## 2020-07-19 LAB — RESPIRATORY PANEL BY RT PCR (FLU A&B, COVID)
Influenza A by PCR: NEGATIVE
Influenza B by PCR: NEGATIVE
SARS Coronavirus 2 by RT PCR: NEGATIVE

## 2020-07-19 MED ORDER — PYRIDOSTIGMINE BROMIDE 60 MG PO TABS
60.0000 mg | ORAL_TABLET | Freq: Once | ORAL | Status: AC
  Administered 2020-07-19: 60 mg via ORAL
  Filled 2020-07-19: qty 1

## 2020-07-19 MED ORDER — OXYCODONE HCL 5 MG PO TABS
10.0000 mg | ORAL_TABLET | Freq: Once | ORAL | Status: AC
  Administered 2020-07-19: 10 mg via ORAL
  Filled 2020-07-19: qty 2

## 2020-07-19 NOTE — ED Provider Notes (Signed)
[facility] Emergency Department Provider Note   ____________________________________________   First MD Initiated Contact with Patient 07/19/20 0139     (approximate)  I have reviewed the triage vital signs and the nursing notes.   HISTORY  Chief Complaint Syncope, Fall   HPI [name] is a 59 y.o. male with past medical history of hypertension, hyperlipidemia, CAD, CHF, diabetes, and myasthenia gravis who presents to the ED following syncope and fall.  Patient reports that he has been feeling congested over the past couple of days with a nonproductive cough, but denies any difficulty breathing or chest pain.  Earlier today he had a coughing spell, started to feel lightheaded, and passed out.  He struck his head on the edge of the bathtub while falling to the ground, believes he was passed out for only a few seconds.  He now complains of significant pain around his right eye associated with some swelling, but denies any visual changes.  He also endorses a headache, but denies neck pain, numbness, or weakness.  He currently takes Plavix.        Past Medical History:  Diagnosis Date  . Allergy    dust, seasonal (worse in the fall).  . 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 Followed by Pain Specialist in [place], back and neck  . Asthma    BRONCHITIS  . Cataract    First Dx in 2012  . Chronic combined systolic and diastolic congestive heart failure ([place])    a. 03/2018 Echo: EF 30-35%, ant, antlat, apical AK, Gr1 DD; b. 07/2018 Echo: EF 35-40%, anteroseptal, apical, and ant HK. Gr1 DD; c. 09/2019 TEE: EF 40-45%.  . Coronary artery disease    a. Prior Ant MI->s/p multiple stents placed in the LAD and right coronary artery ([place]); b. 2016 Cath: reportedly nonobs dzs;  c. 04/2018 Cath/PCI: LM nl, LAD 20p, patent mid stent, LCX 66m(3.25x15 [place] DES), OM1 nl, OM2 50, OM3 40 w/ patent stent, RCA 40p, 34m, 40d w/ patent stent in RPDA, RPAV 60, EF 25-35%. 2+MR; d.  02/2019 MV: Apical scar, no isch, EF 30-44%.  . Diabetes mellitus without complication ([place])    TYPE 2  . Diabetic peripheral neuropathy (HCC)    feet and hands  . FUO (fever of unknown origin) 08/03/2018  . GERD (gastroesophageal reflux disease)   . Headache    muscle tension  . Hyperlipidemia   . Hypertension    CONTROLLED ON MEDS  . Insomnia   . Ischemic cardiomyopathy    a. 03/2018 Echo: EF 30-35%, ant, antlat, apical AK, Gr1 DD, mild MR, mildly dil LA; b. 07/2018 Echo: EF 35-40%; c. 09/2018 TEE: EF 40-45%, antsept, ant HK.  [name] Knee pain, acute 05/06/2020  . Left arm weakness 10/04/2019  . Left leg weakness 12/01/2019  . Lyme disease    Chronic  . Myasthenia gravis ([place])   . Myocardial infarction ([place]) 2010  . Seasonal allergies   . Sepsis ([place])    a.07/2018 - unknown source. TEE neg for veg 09/2019.  [name] Sleep apnea    CPAP    Patient Active Problem List   Diagnosis Date Noted  . Lung involvement associated with another disorder ([place]) 05/17/2020  . Left leg weakness 12/01/2019  . Left arm weakness 10/04/2019  . Lower urinary tract symptoms 09/15/2019  . Incomplete bladder emptying 09/15/2019  . Carpal tunnel syndrome on both sides 07/12/2019  . Weakness generalized 06/21/2019  . Idiopathic peripheral neuropathy 04/21/2019  .  Sensory ataxia 03/09/2019  . Colitis 07/08/2018  . OSA on CPAP 07/08/2018  . Congestive heart failure (CHF) ([place]) 05/05/2018  . Unstable angina ([place]) 04/26/2018  . Ischemic cardiomyopathy 04/26/2018  . Chronic combined systolic and diastolic CHF (congestive heart failure) ([place]) 04/26/2018  . Effort angina ([place]) 04/25/2018  . 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[place]) 01/25/2018  . Hyperlipidemia due to type 2 diabetes mellitus ([place]) 08/12/2017  . Vitamin D deficiency, unspecified 08/12/2017  . Ptosis of left eyelid 01/08/2017  . Dermatitis 12/24/2016  . Difficulty walking 04/27/2016  . Foot cramps 04/27/2016  . Morbid (severe)  obesity due to excess calories ([place]) 04/06/2016  . Benign neoplasm of sigmoid colon   . Benign neoplasm of descending colon   . Benign neoplasm of transverse colon   . Coronary artery disease involving native coronary artery with angina pectoris ([place]) 10/22/2015  . Coronary artery disease 08/22/2015  . DM (diabetes mellitus), type 2, uncontrolled with complications ([place]) 31/54/0086  . Myasthenia gravis ([place]) 08/22/2015  . Chronic left-sided low back pain 08/22/2015  . Hypertension 08/22/2015  . GERD (gastroesophageal reflux disease) 08/22/2015  . Arthritis 08/22/2015  . Diabetic peripheral neuropathy ([place]) 08/22/2015  . Hyperlipidemia 08/22/2015  . Insomnia 08/22/2015  . Type 2 diabetes mellitus with diabetic polyneuropathy, with long-term current use of insulin ([place]) 08/22/2015  . Asthma 08/22/2015  . Lyme disease 06/05/1992    Past Surgical History:  Procedure Laterality Date  . BILATERAL CARPAL TUNNEL RELEASE Bilateral L in 2012 and R in 2013  . CARDIAC CATHETERIZATION     Several Caths, most recent in  March 2016.  [name] COLONOSCOPY WITH PROPOFOL N/A 01/10/2016   Procedure: COLONOSCOPY WITH PROPOFOL;  Surgeon: [name], MD;  Location: [facility] ENDOSCOPY;  Service: Endoscopy;  Laterality: N/A;  . CORONARY ANGIOPLASTY    . CORONARY STENT INTERVENTION N/A 04/25/2018   Procedure: CORONARY STENT INTERVENTION;  Surgeon: [name], MD;  Location: [facility];  Service: Cardiovascular;  Laterality: N/A;  . ESOPHAGOGASTRODUODENOSCOPY (EGD) WITH PROPOFOL N/A 01/10/2016   Procedure: ESOPHAGOGASTRODUODENOSCOPY (EGD) WITH PROPOFOL;  Surgeon: [name], MD;  Location: [facility] ENDOSCOPY;  Service: Endoscopy;  Laterality: N/A;  . EYE SURGERY Bilateral 2012   cataract/bilateral vitrectomies  . LEFT HEART CATH AND CORONARY ANGIOGRAPHY Left 04/25/2018   Procedure: LEFT HEART CATH AND CORONARY ANGIOGRAPHY;  Surgeon: [name], MD;  Location: [facility];  Service:  Cardiovascular;  Laterality: Left;  . TEE WITHOUT CARDIOVERSION N/A 09/05/2018   Procedure: TRANSESOPHAGEAL ECHOCARDIOGRAM (TEE);  Surgeon: [name], MD;  Location: [facility];  Service: Cardiovascular;  Laterality: N/A;  . TONSILLECTOMY AND ADENOIDECTOMY     As a child  . TUNNELED VENOUS CATHETER PLACEMENT     removed    Prior to Admission medications   Medication Sig Start Date End Date Taking? Authorizing Provider  albuterol (PROVENTIL HFA;VENTOLIN HFA) 108 (90 Base) MCG/ACT inhaler Inhale 2 puffs into the lungs every 6 (six) hours as needed for wheezing or shortness of breath.     [provider]  albuterol (PROVENTIL) (2.5 MG/3ML) 0.083% nebulizer solution Take 2.5 mg by nebulization every 6 (six) hours as needed for wheezing or shortness of breath. Patient not taking: Reported on 05/06/2020    [provider]  aspirin EC 81 MG tablet Take 1 tablet (81 mg total) by mouth daily. 03/28/18   Theora Gianotti, NP  azithromycin (ZITHROMAX) 250 MG tablet Take 2 tablets on day 1, then 1 tablet daily  for the next 4 days 06/18/20   Towanda Malkin, MD  betamethasone valerate ointment (VALISONE) 0.1 % Apply topically. Patient not taking: Reported on 06/18/2020    [provider]  clopidogrel (PLAVIX) 75 MG tablet Take 1 tablet (75 mg total) by mouth daily with breakfast. 02/05/20   [name], MD  cyclobenzaprine (FLEXERIL) 10 MG tablet Take 10 mg by mouth 2 (two) times daily as needed. 05/10/20   [provider]  dexlansoprazole (DEXILANT) 60 MG capsule Take 1 capsule (60 mg total) by mouth daily. 02/12/20   [name], MD  DULoxetine (CYMBALTA) 60 MG capsule TAKE 1 CAPSULE(60 MG) BY MOUTH DAILY 07/09/20   Ancil Boozer, Drue Stager, MD  ezetimibe (ZETIA) 10 MG tablet Take 1 tablet (10 mg total) by mouth daily. 05/26/19 06/18/20  Theora Gianotti, NP  Fluticasone-Umeclidin-Vilant (TRELEGY ELLIPTA) 100-62.5-25 MCG/INH AEPB Inhale 1 each into the  lungs daily. In place of breo 05/17/20   [name], MD  furosemide (LASIX) 20 MG tablet Take 20 mg by mouth every other day.    [name], MD  gabapentin (NEURONTIN) 300 MG capsule Take 1 capsule (300 mg total) by mouth 3 (three) times daily. 09/25/15   Roselee Nova, MD  gabapentin (NEURONTIN) 800 MG tablet Take 800 mg by mouth at bedtime.    Mehrabyan, Anahit Cheluskin, MD  insulin lispro (HUMALOG KWIKPEN) 100 UNIT/ML KiwkPen Inject 7-8 Units into the skin 3 (three) times daily. Reported on 12/02/2015/ sliding scale 1 unit for every 8 units of carbs; and 1 unit for every 20 above 120    [provider]  Insulin Pen Needle (FIFTY50 PEN NEEDLES) 32G X 4 MM MISC Inject 1 each into the skin as needed. 02/24/18   [provider]  LANTUS SOLOSTAR 100 UNIT/ML Solostar Pen Inject 25-30 Units into the skin daily at 10 pm.  03/25/16   Sherri Sear, MD  levocetirizine (XYZAL) 5 MG tablet TAKE 1 TABLET(5 MG) BY MOUTH EVERY EVENING 05/10/20   Ancil Boozer, Drue Stager, MD  levothyroxine (SYNTHROID) 25 MCG tablet TAKE 1 TABLET(25 MCG) BY MOUTH DAILY BEFORE BREAKFAST 01/05/20   Sowles, Drue Stager, MD  liraglutide (VICTOZA) 18 MG/3ML SOPN 1.8 units every morning Patient taking differently: Inject 1.8 mg into the skin daily.  04/26/18   Theora Gianotti, NP  loratadine (CLARITIN) 10 MG tablet Take 10 mg by mouth daily.     [provider]  losartan (COZAAR) 25 MG tablet TAKE 1 TABLET(25 MG) BY MOUTH DAILY 07/11/20   [name], MD  metFORMIN (GLUCOPHAGE) 1000 MG tablet Take 1,000 mg by mouth 2 (two) times daily with a meal.    Sherri Sear, MD  metoprolol succinate (TOPROL-XL) 100 MG 24 hr tablet Take 1 tablet (100 mg total) by mouth daily. Take with or immediately following a meal. 02/01/20 06/18/20  [name], MD  montelukast (SINGULAIR) 10 MG tablet Take 10 mg by mouth at bedtime. 12/02/18   [provider]  Multiple Vitamins-Minerals (MENS  MULTI VITAMIN & MINERAL PO) Take 1 tablet by mouth daily.    [provider]  naloxone [place]) nasal spray 4 mg/0.1 mL For excess sedation from opioids Patient not taking: Reported on 05/17/2020 09/15/16   Molli Barrows, MD  nitroGLYCERIN (NITROSTAT) 0.3 MG SL tablet Place 1 tablet (0.3 mg total) under the tongue every 5 (five) minutes as needed for chest pain. Maximum 3 doses. Patient not taking: Reported on 05/17/2020 08/18/18   Sharolyn Douglas,  [name], NP  oxyCODONE (ROXICODONE) 5 MG immediate release tablet Take 2 tablets (10 mg total) by mouth 4 (four) times daily. 07/13/20 08/12/20  Molli Barrows, MD  oxyCODONE (ROXICODONE) 5 MG immediate release tablet Take 2 tablets (10 mg total) by mouth every 6 (six) hours as needed for severe pain. 08/12/20 09/11/20  Molli Barrows, MD  predniSONE (DELTASONE) 10 MG tablet Take four tabs daily x 3 days, then two tabs daily X 3 days, then one tab daily x 3 days 06/18/20   Towanda Malkin, MD  pyridostigmine (MESTINON) 60 MG tablet Take 1 tablet by mouth 3 (three) times daily. 07/10/19   Mehrabyan, Anahit Cheluskin, MD  rosuvastatin (CRESTOR) 5 MG tablet Take 5 mg by mouth daily. 11/20/17   [name], MD  tamsulosin (FLOMAX) 0.4 MG CAPS capsule TAKE 2 CAPSULES BY MOUTH DAILY Patient not taking: Reported on 06/18/2020 06/03/20   Abbie Sons, MD    Allergies Azathioprine and Novolog [insulin aspart]  Family History  Problem Relation Age of Onset  . Diabetes Mother   . Heart disease Mother   . Cancer Father        Prostate CA, Anal cancer   . Dementia Father   . Diabetes Brother   . Healthy Brother   . Healthy Brother     Social History Social History   Tobacco Use  . Smoking status: Never Smoker  . Smokeless tobacco: Never Used  . Tobacco comment: smoking cessation materials not required  Vaping Use  . Vaping Use: Never used  Substance Use Topics  . Alcohol use: Not Currently    Alcohol/week: 0.0 standard drinks    . Drug use: No    Review of Systems  Constitutional: No fever/chills Eyes: No visual changes. ENT: No sore throat. Cardiovascular: Denies chest pain.  Positive for syncope. Respiratory: Denies shortness of breath.  Positive for congestion and cough. Gastrointestinal: No abdominal pain.  No nausea, no vomiting.  No diarrhea.  No constipation. Genitourinary: Negative for dysuria. Musculoskeletal: Negative for back pain. Skin: Negative for rash. Neurological: Positive for headaches, negative for focal weakness or numbness.  ____________________________________________   PHYSICAL EXAM:  VITAL SIGNS: ED Triage Vitals  Enc Vitals Group     BP 07/18/20 1710 (!) 132/114     Pulse Rate 07/18/20 1710 91     Resp 07/18/20 1710 16     Temp 07/18/20 1710 98.1 F (36.7 C)     Temp Source 07/18/20 1710 Oral     SpO2 07/18/20 1710 98 %     Weight 07/18/20 1711 240 lb (108.9 kg)     Height 07/18/20 1711 5\' 7"  (1.702 m)     Head Circumference --      Peak Flow --      Pain Score 07/18/20 1710 6     Pain Loc --      Pain Edu? --      Excl. in [place]? --     Constitutional: Alert and oriented. Eyes: Conjunctivae are normal.  Pupils equal round and reactive to light bilaterally, extraocular movements intact. Head: Periorbital hematoma noted on right.  No facial bony tenderness or step-offs. Nose: No congestion/rhinnorhea. Mouth/Throat: Mucous membranes are moist. Neck: Normal ROM, no midline cervical spine tenderness. Cardiovascular: Normal rate, regular rhythm. Grossly normal heart sounds. Respiratory: Normal respiratory effort.  No retractions. Lungs CTAB. Gastrointestinal: Soft and nontender. No distention. Genitourinary: deferred Musculoskeletal: No lower extremity tenderness, trace pitting edema to knees bilaterally.  Neurologic:  Normal speech and language. No gross focal neurologic deficits are appreciated. Skin:  Skin is warm, dry and intact. No rash noted. Psychiatric: Mood and  affect are normal. Speech and behavior are normal.  ____________________________________________   LABS (all labs ordered are listed, but only abnormal results are displayed)  Labs Reviewed  BASIC METABOLIC PANEL - Abnormal; Notable for the following components:      Result Value   Glucose, Bld 181 (*)    Calcium 8.8 (*)    All other components within normal limits  CBC - Abnormal; Notable for the following components:   RBC 3.76 (*)    Hemoglobin 9.7 (*)    HCT 31.8 (*)    MCH 25.8 (*)    All other components within normal limits  URINALYSIS, COMPLETE (UACMP) WITH MICROSCOPIC - Abnormal; Notable for the following components:   Color, Urine STRAW (*)    APPearance CLEAR (*)    Glucose, UA 150 (*)    All other components within normal limits  GLUCOSE, CAPILLARY - Abnormal; Notable for the following components:   Glucose-Capillary 174 (*)    All other components within normal limits  RESPIRATORY PANEL BY RT PCR (FLU A&B, COVID)  CBG MONITORING, ED   ____________________________________________  EKG  ED ECG REPORT I, Blake Divine, the attending physician, personally viewed and interpreted this ECG.   Date: 07/19/2020  EKG Time: 17:19  Rate: 86  Rhythm: normal sinus rhythm  Axis: Normal  Intervals:nonspecific intraventricular conduction delay  ST&T Change: None   PROCEDURES  Procedure(s) performed (including Critical Care):  Procedures   ____________________________________________   INITIAL IMPRESSION / ASSESSMENT AND PLAN / ED COURSE       59 year old male with past medical history of hypertension, hyperlipidemia, diabetes, CAD, CHF, and myasthenia gravis who presents to the ED complaining of syncopal episode after coughing causing him to strike his head on the edge of the bathtub.  CT head and maxillofacial are negative for acute process, will add on CT C-spine given concerning mechanism.  He does have significant periorbital hematoma on the right, but no  evidence of ocular injury.  EKG shows no evidence of arrhythmia or ischemia, lab work reassuring.  Syncope likely from vasal vagal episode given it was immediately preceded by coughing episode.  We will treat patient's pain with his usual oxycodone and also give his evening dose of physostigmine for myasthenia gravis.  We will also check chest x-ray given his recent cough and congestion, he does appear slightly fluid overloaded.  CT C-spine is negative for acute process, chest x-ray is also unremarkable.  Patient reports feeling better following his usual dose of oxycodone and physostigmine.  COVID-19 results are pending and patient counseled to quarantine until results are known.  He was also counseled to follow-up with his PCP given syncopal episode, states he has appointment scheduled for early next week.  He was counseled to return to the ED for new or worsening symptoms, patient agrees with plan.      ____________________________________________   FINAL CLINICAL IMPRESSION(S) / ED DIAGNOSES  Final diagnoses:  Syncope, unspecified syncope type  Periorbital hematoma of right eye     ED Discharge Orders    None       Note:  This document was prepared using Dragon voice recognition software and may include unintentional dictation errors.   Blake Divine, MD 07/19/20 [phone]

## 2020-07-19 NOTE — Telephone Encounter (Signed)
Copied from [place] [phone]. Topic: General - Other >> Jul 19, 2020 11:15 AM Alanda Slim E wrote: Reason for CRM: Pt had a fall last night and was taken to the hospital / Pt was told to advise Dr. Ancil Boozer of this/Pt will be in for his appt Tuesday

## 2020-07-23 ENCOUNTER — Other Ambulatory Visit: Payer: Self-pay

## 2020-07-23 ENCOUNTER — Ambulatory Visit: Payer: Medicare Other

## 2020-07-23 ENCOUNTER — Ambulatory Visit (INDEPENDENT_AMBULATORY_CARE_PROVIDER_SITE_OTHER): Payer: Medicare Other

## 2020-07-23 VITALS — BP 122/72 | HR 88 | Temp 97.8°F | Resp 16 | Ht 67.0 in | Wt 253.0 lb

## 2020-07-23 DIAGNOSIS — I25118 Atherosclerotic heart disease of native coronary artery with other forms of angina pectoris: Secondary | ICD-10-CM

## 2020-07-23 DIAGNOSIS — E1142 Type 2 diabetes mellitus with diabetic polyneuropathy: Secondary | ICD-10-CM | POA: Diagnosis not present

## 2020-07-23 DIAGNOSIS — I5022 Chronic systolic (congestive) heart failure: Secondary | ICD-10-CM

## 2020-07-23 DIAGNOSIS — Z794 Long term (current) use of insulin: Secondary | ICD-10-CM

## 2020-07-23 DIAGNOSIS — Z0000 Encounter for general adult medical examination without abnormal findings: Secondary | ICD-10-CM

## 2020-07-23 DIAGNOSIS — Z23 Encounter for immunization: Secondary | ICD-10-CM

## 2020-07-23 DIAGNOSIS — R4589 Other symptoms and signs involving emotional state: Secondary | ICD-10-CM

## 2020-07-23 NOTE — Progress Notes (Signed)
Subjective:   [name] is a 59 y.o. male who presents for Medicare Annual/Subsequent preventive examination.  Review of Systems     Cardiac Risk Factors include: advanced age (>27men, >72 women);diabetes mellitus;dyslipidemia;hypertension;male gender;obesity (BMI >30kg/m2)     Objective:    Today's Vitals   07/23/20 1118 07/23/20 1120  BP: 122/72   Pulse: 88   Resp: 16   Temp: 97.8 F (36.6 C)   TempSrc: Oral   SpO2: 99%   Weight: 253 lb (114.8 kg)   Height: 5\' 7"  (1.702 m)   PainSc:  4    Body mass index is 39.63 kg/m.  Advanced Directives 07/23/2020 07/18/2020 02/20/2020 02/12/2020 07/18/2019 09/05/2018 07/21/2018  Does Patient Have a Medical Advance Directive? Yes No Yes Yes Yes No Yes  Type of Paramedic of [place];Living will - [place];Living will - [place];Living will - Living will;Healthcare Power of Attorney  Does patient want to make changes to medical advance directive? - - - - Yes (MAU/Ambulatory/Procedural Areas - Information given) - No - Patient declined  Copy of [place] in Chart? No - copy requested - - - - - No - copy requested  Would patient like information on creating a medical advance directive? - No - Patient declined - - - No - Patient declined -    Current Medications (verified) Outpatient Encounter Medications as of 07/23/2020  Medication Sig  . albuterol (PROVENTIL HFA;VENTOLIN HFA) 108 (90 Base) MCG/ACT inhaler Inhale 2 puffs into the lungs every 6 (six) hours as needed for wheezing or shortness of breath.   [name] albuterol (PROVENTIL) (2.5 MG/3ML) 0.083% nebulizer solution Take 2.5 mg by nebulization every 6 (six) hours as needed for wheezing or shortness of breath.   [name] aspirin EC 81 MG tablet Take 1 tablet (81 mg total) by mouth daily.  . betamethasone valerate ointment (VALISONE) 0.1 % Apply topically.   . clopidogrel (PLAVIX) 75 MG tablet Take 1 tablet  (75 mg total) by mouth daily with breakfast.  . cyclobenzaprine (FLEXERIL) 10 MG tablet Take 10 mg by mouth 2 (two) times daily as needed.  [name] dexlansoprazole (DEXILANT) 60 MG capsule Take 1 capsule (60 mg total) by mouth daily.  . DULoxetine (CYMBALTA) 60 MG capsule TAKE 1 CAPSULE(60 MG) BY MOUTH DAILY  . Fluticasone-Umeclidin-Vilant (TRELEGY ELLIPTA) 100-62.5-25 MCG/INH AEPB Inhale 1 each into the lungs daily. In place of breo  . furosemide (LASIX) 20 MG tablet Take 20 mg by mouth every other day.  . gabapentin (NEURONTIN) 300 MG capsule Take 1 capsule (300 mg total) by mouth 3 (three) times daily.  [name] gabapentin (NEURONTIN) 800 MG tablet Take 800 mg by mouth at bedtime.  . insulin lispro (HUMALOG KWIKPEN) 100 UNIT/ML KiwkPen Inject 7-8 Units into the skin 3 (three) times daily. Reported on 12/02/2015/ sliding scale 1 unit for every 8 units of carbs; and 1 unit for every 20 above 120  . Insulin Pen Needle (FIFTY50 PEN NEEDLES) 32G X 4 MM MISC Inject 1 each into the skin as needed.  [name] LANTUS SOLOSTAR 100 UNIT/ML Solostar Pen Inject 25-30 Units into the skin daily at 10 pm.   . levocetirizine (XYZAL) 5 MG tablet TAKE 1 TABLET(5 MG) BY MOUTH EVERY EVENING  . levothyroxine (SYNTHROID) 25 MCG tablet TAKE 1 TABLET(25 MCG) BY MOUTH DAILY BEFORE BREAKFAST  . liraglutide (VICTOZA) 18 MG/3ML SOPN 1.8 units every morning (Patient taking differently: Inject 1.8 mg into the skin daily. )  .  loratadine (CLARITIN) 10 MG tablet Take 10 mg by mouth daily.   [name] losartan (COZAAR) 25 MG tablet TAKE 1 TABLET(25 MG) BY MOUTH DAILY  . metFORMIN (GLUCOPHAGE) 1000 MG tablet Take 1,000 mg by mouth 2 (two) times daily with a meal.  . montelukast (SINGULAIR) 10 MG tablet Take 10 mg by mouth at bedtime.  . Multiple Vitamins-Minerals (MENS MULTI VITAMIN & MINERAL PO) Take 1 tablet by mouth daily.  . naloxone (NARCAN) nasal spray 4 mg/0.1 mL For excess sedation from opioids  . nitroGLYCERIN (NITROSTAT) 0.3 MG SL tablet Place 1  tablet (0.3 mg total) under the tongue every 5 (five) minutes as needed for chest pain. Maximum 3 doses.  [name] oxyCODONE (ROXICODONE) 5 MG immediate release tablet Take 2 tablets (10 mg total) by mouth 4 (four) times daily.  [name] pyridostigmine (MESTINON) 60 MG tablet Take 1 tablet by mouth 3 (three) times daily.  . rosuvastatin (CRESTOR) 5 MG tablet Take 5 mg by mouth daily.  . tamsulosin (FLOMAX) 0.4 MG CAPS capsule TAKE 2 CAPSULES BY MOUTH DAILY  . ezetimibe (ZETIA) 10 MG tablet Take 1 tablet (10 mg total) by mouth daily.  . metoprolol succinate (TOPROL-XL) 100 MG 24 hr tablet Take 1 tablet (100 mg total) by mouth daily. Take with or immediately following a meal.  . [START ON 08/12/2020] oxyCODONE (ROXICODONE) 5 MG immediate release tablet Take 2 tablets (10 mg total) by mouth every 6 (six) hours as needed for severe pain.  . [DISCONTINUED] azithromycin (ZITHROMAX) 250 MG tablet Take 2 tablets on day 1, then 1 tablet daily for the next 4 days  . [DISCONTINUED] predniSONE (DELTASONE) 10 MG tablet Take four tabs daily x 3 days, then two tabs daily X 3 days, then one tab daily x 3 days   No facility-administered encounter medications on file as of 07/23/2020.    Allergies (verified) Azathioprine and Novolog [insulin aspart]   History: Past Medical History:  Diagnosis Date  . Allergy    dust, seasonal (worse in the fall).  . 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 Followed by Pain Specialist in [place], back and neck  . Asthma    BRONCHITIS  . Cataract    First Dx in 2012  . Chronic combined systolic and diastolic congestive heart failure ([place])    a. 03/2018 Echo: EF 30-35%, ant, antlat, apical AK, Gr1 DD; b. 07/2018 Echo: EF 35-40%, anteroseptal, apical, and ant HK. Gr1 DD; c. 09/2019 TEE: EF 40-45%.  . Coronary artery disease    a. Prior Ant MI->s/p multiple stents placed in the LAD and right coronary artery ([place]); b. 2016 Cath: reportedly nonobs dzs;  c. 04/2018 Cath/PCI: LM nl, LAD 20p, patent mid  stent, LCX 57m(3.25x15 Sierra DES), OM1 nl, OM2 50, OM3 40 w/ patent stent, RCA 40p, 27m, 40d w/ patent stent in RPDA, RPAV 60, EF 25-35%. 2+MR; d. 02/2019 MV: Apical scar, no isch, EF 30-44%.  . Diabetes mellitus without complication ([place])    TYPE 2  . Diabetic peripheral neuropathy (HCC)    feet and hands  . FUO (fever of unknown origin) 08/03/2018  . GERD (gastroesophageal reflux disease)   . Headache    muscle tension  . Hyperlipidemia   . Hypertension    CONTROLLED ON MEDS  . Insomnia   . Ischemic cardiomyopathy    a. 03/2018 Echo: EF 30-35%, ant, antlat, apical AK, Gr1 DD, mild MR, mildly dil LA; b. 07/2018 Echo: EF 35-40%; c. 09/2018 TEE: EF 40-45%, antsept,  ant HK.  [name] Knee pain, acute 05/06/2020  . Left arm weakness 10/04/2019  . Left leg weakness 12/01/2019  . Lyme disease    Chronic  . Myasthenia gravis ([place])   . Myocardial infarction ([place]) 2010  . Seasonal allergies   . Sepsis ([place])    a.07/2018 - unknown source. TEE neg for veg 09/2019.  [name] Sleep apnea    CPAP   Past Surgical History:  Procedure Laterality Date  . BILATERAL CARPAL TUNNEL RELEASE Bilateral L in 2012 and R in 2013  . CARDIAC CATHETERIZATION     Several Caths, most recent in  March 2016.  [name] COLONOSCOPY WITH PROPOFOL N/A 01/10/2016   Procedure: COLONOSCOPY WITH PROPOFOL;  Surgeon: [name], MD;  Location: [facility] ENDOSCOPY;  Service: Endoscopy;  Laterality: N/A;  . CORONARY ANGIOPLASTY    . CORONARY STENT INTERVENTION N/A 04/25/2018   Procedure: CORONARY STENT INTERVENTION;  Surgeon: [name], MD;  Location: [facility];  Service: Cardiovascular;  Laterality: N/A;  . ESOPHAGOGASTRODUODENOSCOPY (EGD) WITH PROPOFOL N/A 01/10/2016   Procedure: ESOPHAGOGASTRODUODENOSCOPY (EGD) WITH PROPOFOL;  Surgeon: [name], MD;  Location: [facility] ENDOSCOPY;  Service: Endoscopy;  Laterality: N/A;  . EYE SURGERY Bilateral 2012   cataract/bilateral vitrectomies  . LEFT HEART CATH AND CORONARY ANGIOGRAPHY Left  04/25/2018   Procedure: LEFT HEART CATH AND CORONARY ANGIOGRAPHY;  Surgeon: [name], MD;  Location: [facility];  Service: Cardiovascular;  Laterality: Left;  . TEE WITHOUT CARDIOVERSION N/A 09/05/2018   Procedure: TRANSESOPHAGEAL ECHOCARDIOGRAM (TEE);  Surgeon: [name], MD;  Location: [facility] OR[facility];  Service: Cardiovascular;  Laterality: N/A;  . TONSILLECTOMY AND ADENOIDECTOMY     As a child  . TUNNELED VENOUS CATHETER PLACEMENT     removed   Family History  Problem Relation Age of Onset  . Diabetes Mother   . Heart disease Mother   . Cancer Father        Prostate CA, Anal cancer   . Dementia Father   . Diabetes Brother   . Healthy Brother   . Healthy Brother    Social History   Socioeconomic History  . Marital status: Married    Spouse name: Neoma Laming  . Number of children: 0  . Years of education: Not on file  . Highest education level: Master's degree (e.g., MA, MS, MEng, MEd, MSW, MBA)  Occupational History  . Occupation: disabled    Comment: multiple medical problems - first approved for uncontrolled DM, but now has heart disease and Myasthenia Gravis   Tobacco Use  . Smoking status: Never Smoker  . Smokeless tobacco: Never Used  . Tobacco comment: smoking cessation materials not required  Vaping Use  . Vaping Use: Never used  Substance and Sexual Activity  . Alcohol use: Not Currently    Alcohol/week: 0.0 standard drinks  . Drug use: No  . Sexual activity: Not Currently    Partners: Female  Other Topics Concern  . Not on file  Social History Narrative  . Not on file   Social Determinants of Health   Financial Resource Strain: Low Risk   . Difficulty of Paying Living Expenses: Not hard at all  Food Insecurity: No Food Insecurity  . Worried About Charity fundraiser in the Last Year: Never true  . Ran Out of Food in the Last Year: Never true  Transportation Needs: No Transportation Needs  . Lack of Transportation (Medical): No  . Lack  of Transportation (Non-Medical): No  Physical Activity: Insufficiently Active  . Days of Exercise per Week: 3 days  . Minutes of Exercise per Session: 30 min  Stress: No Stress Concern Present  . Feeling of Stress : Only a little  Social Connections: Socially Integrated  . Frequency of Communication with Friends and Family: More than three times a week  . Frequency of Social Gatherings with Friends and Family: Three times a week  . Attends Religious Services: More than 4 times per year  . Active Member of Clubs or Organizations: Yes  . Attends Archivist Meetings: More than 4 times per year  . Marital Status: Married    Tobacco Counseling Counseling given: Not Answered Comment: smoking cessation materials not required   Clinical Intake:  Pre-visit preparation completed: Yes  Pain : 0-10 Pain Score: 4  Pain Type: Chronic pain Pain Location: Back Pain Orientation: Lower Pain Descriptors / Indicators: Aching, Sore Pain Onset: More than a month ago Pain Frequency: Constant     BMI - recorded: 39.63 Nutritional Status: BMI > 30  Obese Nutritional Risks: None Diabetes: Yes CBG done?: No Did pt. bring in CBG monitor from home?: No  How often do you need to have someone help you when you read instructions, pamphlets, or other written materials from your doctor or pharmacy?: 1 - Never  Nutrition Risk Assessment:  Has the patient had any N/V/D within the last 2 months?  No  Does the patient have any non-healing wounds?  No  Has the patient had any unintentional weight loss or weight gain?  No   Diabetes:  Is the patient diabetic?  Yes  If diabetic, was a CBG obtained today?  No  Did the patient bring in their glucometer from home?  No  How often do you monitor your CBG's? Four times per day with freestyle [employer] and Diabetes Management:  Are you having any financial strains with the device, your supplies or your medication? No .  Does the  patient want to be seen by Chronic Care Management for management of their diabetes?  Yes Would the patient like to be referred to a Nutritionist or for Diabetic Management?  No   Diabetic Exams:  Diabetic Eye Exam: Completed 08/02/19 positive retinopathy.  Diabetic Foot Exam: Completed 06/14/19. Pt has been advised about the importance in completing this exam. Pt is scheduled for diabetic foot exam on 09/16/20.    Interpreter Needed?: No  Information entered by :: Clemetine Marker LPN   Activities of Daily Living In your present state of health, do you have any difficulty performing the following activities: 07/23/2020 06/18/2020  Hearing? 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 N N  Difficulty concentrating or making decisions? N N  Walking or climbing stairs? Y N  Dressing or bathing? N N  Doing errands, shopping? N Y  Conservation officer, nature and eating ? N -  Using the Toilet? N -  In the past six months, have you accidently leaked urine? N -  Do you have problems with loss of bowel control? N -  Managing your Medications? N -  Managing your Finances? N -  Housekeeping or managing your Housekeeping? N -  Some recent data might be hidden    Patient Care Team: [name], MD as PCP - General (Family Medicine) [name], MD as PCP - Cardiology (Cardiology) Erby Pian, MD as Consulting Physician (Specialist) Molli Barrows, MD as Consulting Physician (  Anesthesiology) Lonia Farber, MD as Consulting Physician (Internal Medicine) Cleotilde Neer, MD as Referring Physician (Neurology) [name], MD as Consulting Physician (Gastroenterology)  Indicate any recent Medical Services you may have received from other than [facility] providers in the past year (date may be approximate).     Assessment:   This is a routine wellness examination for [name].  Hearing/Vision screen  Hearing Screening   125Hz  250Hz  500Hz  1000Hz  2000Hz  3000Hz   4000Hz  6000Hz  8000Hz   Right ear:           Left ear:           Comments: Pt wears hearing aids maintained by [facility]  Vision Screening Comments: [facility]  Dietary issues and exercise activities discussed: Current Exercise Habits: Home exercise routine, Type of exercise: walking, Time (Minutes): 30, Frequency (Times/Week): 3, Weekly Exercise (Minutes/Week): 90, Intensity: Moderate, Exercise limited by: orthopedic condition(s);neurologic condition(s)  Goals    . DIET - INCREASE WATER INTAKE     Recommend to drink at least 6-8 8oz glasses of water per day.    . Weight (lb) < 225 lb (102.1 kg)     Pt states he would like to lose weight with healthy eating and physical activity       Depression Screen PHQ 2/9 Scores 07/23/2020 06/18/2020 05/17/2020 02/20/2020 02/02/2020 10/16/2019 07/21/2019  PHQ - 2 Score 0 0 0 0 2 2 5   PHQ- 9 Score - - 1 - 11 9 14   Exception Documentation - - - - - - -    Fall Risk Fall Risk  07/23/2020 06/18/2020 05/17/2020 02/20/2020 02/12/2020  Falls in the past year? 1 1 1 1 1   Comment - - - - -  Number falls in past yr: 0 0 1 - 0  Comment - - - - -  Injury with Fall? 1 0 1 0 0  Risk Factor Category  - - - - -  Risk for fall due to : Impaired balance/gait;History of fall(s);Orthopedic patient - - - -  Risk for fall due to: Comment - - - - -  Follow up Falls prevention discussed Falls evaluation completed - - -    Any stairs in or around the home? Yes  If so, are there any without handrails? Yes   Steps from [place] free of loose throw rugs in walkways, pet beds, electrical cords, etc? Yes  Adequate lighting in your home to reduce risk of falls? Yes   ASSISTIVE DEVICES UTILIZED TO PREVENT FALLS:  Life alert? No  Use of a cane, walker or w/c? Yes  Grab bars in the bathroom? Yes  Shower chair or bench in shower? Yes  Elevated toilet seat or a handicapped toilet? No   TIMED UP AND GO:  Was the test  performed? Yes .  Length of time to ambulate 10 feet: 7 sec.   Gait steady and fast with assistive device  Cognitive Function:     6CIT Screen 07/23/2020 02/25/2018  What Year? 0 points 0 points  What month? 0 points 0 points  What time? 0 points 0 points  Count back from 20 0 points 0 points  Months in reverse 0 points 0 points  Repeat phrase 0 points 0 points  Total Score 0 0    Immunizations Immunization History  Administered Date(s) Administered  . Influenza Inj Mdck Quad Pf 06/14/2019  . Influenza,inj,Quad PF,6+ Mos 06/16/2017, 06/10/2018, 07/23/2020  . PFIZER SARS-COV-2 Vaccination 12/22/2019,  01/16/2020  . Pneumococcal Conjugate-13 07/21/2019  . Pneumococcal Polysaccharide-23 01/25/2018  . Td 04/04/2013    TDAP status: Up to date   Flu Vaccine status: Completed at today's visit   Pneumococcal vaccine status: Up to date   Covid-19 vaccine status: Completed vaccines  Qualifies for Shingles Vaccine? Yes   Zostavax completed No   Shingrix Completed?: No.    Education has been provided regarding the importance of this vaccine. Patient has been advised to call insurance company to determine out of pocket expense if they have not yet received this vaccine. Advised may also receive vaccine at local pharmacy or Health Dept. Verbalized acceptance and understanding.  Screening Tests Health Maintenance  Topic Date Due  . OPHTHALMOLOGY EXAM  08/01/2020  . HEMOGLOBIN A1C  09/20/2020  . COLONOSCOPY  01/09/2021  . FOOT EXAM  03/21/2021  . TETANUS/TDAP  04/05/2023  . INFLUENZA VACCINE  Completed  . PNEUMOCOCCAL POLYSACCHARIDE VACCINE AGE 62-64 HIGH RISK  Completed  . COVID-19 Vaccine  Completed  . Hepatitis C Screening  Completed  . HIV Screening  Completed    Health Maintenance  There are no preventive care reminders to display for this patient.  Colorectal cancer screening: Completed 01/10/16. Repeat every 5 years  Lung Cancer Screening: (Low Dose CT Chest  recommended if Age 1-80 years, 30 pack-year currently smoking OR have quit w/in 15years.) does not qualify.   Additional Screening:  Hepatitis C Screening: does qualify; Completed 01/26/18  Vision Screening: Recommended annual ophthalmology exams for early detection of glaucoma and other disorders of the eye. Is the patient up to date with their annual eye exam?  Yes  Who is the provider or what is the name of the office in which the patient attends annual eye exams? [facility].   Dental Screening: Recommended annual dental exams for proper oral hygiene  Community Resource Referral / Chronic Care Management: CRR required this visit?  No   CCM required this visit?  Yes      Plan:     I have personally reviewed and noted the following in the patient's chart:   . Medical and social history . Use of alcohol, tobacco or illicit drugs  . Current medications and supplements . Functional ability and status . Nutritional status . Physical activity . Advanced directives . List of other physicians . Hospitalizations, surgeries, and ER visits in previous 12 months . Vitals . Screenings to include cognitive, depression, and falls . Referrals and appointments  In addition, I have reviewed and discussed with patient certain preventive protocols, quality metrics, and best practice recommendations. A written personalized care plan for preventive services as well as general preventive health recommendations were provided to patient.     Clemetine Marker, LPN   [id number]   Nurse Notes: pt recently seen in ED for fall from blacking out due to coughing very hard. Pt has follow up appt with cardiology tomorrow due to syncope. Pt also plans to discuss easily feeling SOB with little exertion.   Pt also c/o congestion in chest that has been ongoing since mid summer and he has completed 2 courses of abx and prednisone. Pt currently taking mucinex  BID to help with congestion.   Pt is  interested in therapy for coping mechanisms due to current living situation. He and his wife live with their son and daughter in law and it can be tense at times. Patient give list of counseling resources for [facility] and advised to contact insurance for  in network providers. Patient appreciative of visit today.

## 2020-07-23 NOTE — Patient Instructions (Signed)
[name] , Thank you for taking time to come for your Medicare Wellness Visit. I appreciate your ongoing commitment to your health goals. Please review the following plan we discussed and let me know if I can assist you in the future.   Screening recommendations/referrals: Colonoscopy: done 01/10/16. Repeat 01/2021 Recommended yearly ophthalmology/optometry visit for glaucoma screening and checkup Recommended yearly dental visit for hygiene and checkup  Vaccinations: Influenza vaccine: done today Pneumococcal vaccine: done 07/21/19 Tdap vaccine: done 04/04/13 Shingles vaccine: Shingrix discussed. Please contact your pharmacy for coverage information.  Covid-19:  Done 12/22/19 & 01/16/20  Advanced directives: Please bring a copy of your health care power of attorney and living will to the office at your convenience.  Conditions/risks identified: Recommend healthy eating and physical activity for desired weight loss  Next appointment: Follow up in one year for your annual wellness visit   Preventive Care 40-64 Years, Male Preventive care refers to lifestyle choices and visits with your health care provider that can promote health and wellness. What does preventive care include?  A yearly physical exam. This is also called an annual well check.  Dental exams once or twice a year.  Routine eye exams. Ask your health care provider how often you should have your eyes checked.  Personal lifestyle choices, including:  Daily care of your teeth and gums.  Regular physical activity.  Eating a healthy diet.  Avoiding tobacco and drug use.  Limiting alcohol use.  Practicing safe sex.  Taking low-dose aspirin every day starting at age 83. What happens during an annual well check? The services and screenings done by your health care provider during your annual well check will depend on your age, overall health, lifestyle risk factors, and family history of disease. Counseling  Your  health care provider may ask you questions about your:  Alcohol use.  Tobacco use.  Drug use.  Emotional well-being.  Home and relationship well-being.  Sexual activity.  Eating habits.  Work and work Statistician. Screening  You may have the following tests or measurements:  Height, weight, and BMI.  Blood pressure.  Lipid and cholesterol levels. These may be checked every 5 years, or more frequently if you are over 70 years old.  Skin check.  Lung cancer screening. You may have this screening every year starting at age 84 if you have a 30-pack-year history of smoking and currently smoke or have quit within the past 15 years.  Fecal occult blood test (FOBT) of the stool. You may have this test every year starting at age 33.  Flexible sigmoidoscopy or colonoscopy. You may have a sigmoidoscopy every 5 years or a colonoscopy every 10 years starting at age 50.  Prostate cancer screening. Recommendations will vary depending on your family history and other risks.  Hepatitis C blood test.  Hepatitis B blood test.  Sexually transmitted disease (STD) testing.  Diabetes screening. This is done by checking your blood sugar (glucose) after you have not eaten for a while (fasting). You may have this done every 1-3 years. Discuss your test results, treatment options, and if necessary, the need for more tests with your health care provider. Vaccines  Your health care provider may recommend certain vaccines, such as:  Influenza vaccine. This is recommended every year.  Tetanus, diphtheria, and acellular pertussis (Tdap, Td) vaccine. You may need a Td booster every 10 years.  Zoster vaccine. You may need this after age 29.  Pneumococcal 13-valent conjugate (PCV13) vaccine. You may need this  if you have certain conditions and have not been vaccinated.  Pneumococcal polysaccharide (PPSV23) vaccine. You may need one or two doses if you smoke cigarettes or if you have certain  conditions. Talk to your health care provider about which screenings and vaccines you need and how often you need them. This information is not intended to replace advice given to you by your health care provider. Make sure you discuss any questions you have with your health care provider. Document Released: 10/18/2015 Document Revised: 06/10/2016 Document Reviewed: 07/23/2015 Elsevier Interactive Patient Education  2017 [place] Prevention in the Home Falls can cause injuries. They can happen to people of all ages. There are many things you can do to make your home safe and to help prevent falls. What can I do on the outside of my home?  Regularly fix the edges of walkways and driveways and fix any cracks.  Remove anything that might make you trip as you walk through a door, such as a raised step or threshold.  Trim any bushes or trees on the path to your home.  Use bright outdoor lighting.  Clear any walking paths of anything that might make someone trip, such as rocks or tools.  Regularly check to see if handrails are loose or broken. Make sure that both sides of any steps have handrails.  Any raised decks and porches should have guardrails on the edges.  Have any leaves, snow, or ice cleared regularly.  Use sand or salt on walking paths during winter.  Clean up any spills in your garage right away. This includes oil or grease spills. What can I do in the bathroom?  Use night lights.  Install grab bars by the toilet and in the tub and shower. Do not use towel bars as grab bars.  Use non-skid mats or decals in the tub or shower.  If you need to sit down in the shower, use a plastic, non-slip stool.  Keep the floor dry. Clean up any water that spills on the floor as soon as it happens.  Remove soap buildup in the tub or shower regularly.  Attach bath mats securely with double-sided non-slip rug tape.  Do not have throw rugs and other things on the floor that  can make you trip. What can I do in the bedroom?  Use night lights.  Make sure that you have a light by your bed that is easy to reach.  Do not use any sheets or blankets that are too big for your bed. They should not hang down onto the floor.  Have a firm chair that has side arms. You can use this for support while you get dressed.  Do not have throw rugs and other things on the floor that can make you trip. What can I do in the kitchen?  Clean up any spills right away.  Avoid walking on wet floors.  Keep items that you use a lot in easy-to-reach places.  If you need to reach something above you, use a strong step stool that has a grab bar.  Keep electrical cords out of the way.  Do not use floor polish or wax that makes floors slippery. If you must use wax, use non-skid floor wax.  Do not have throw rugs and other things on the floor that can make you trip. What can I do with my stairs?  Do not leave any items on the stairs.  Make sure that there are handrails on  both sides of the stairs and use them. Fix handrails that are broken or loose. Make sure that handrails are as long as the stairways.  Check any carpeting to make sure that it is firmly attached to the stairs. Fix any carpet that is loose or worn.  Avoid having throw rugs at the top or bottom of the stairs. If you do have throw rugs, attach them to the floor with carpet tape.  Make sure that you have a light switch at the top of the stairs and the bottom of the stairs. If you do not have them, ask someone to add them for you. What else can I do to help prevent falls?  Wear shoes that:  Do not have high heels.  Have rubber bottoms.  Are comfortable and fit you well.  Are closed at the toe. Do not wear sandals.  If you use a stepladder:  Make sure that it is fully opened. Do not climb a closed stepladder.  Make sure that both sides of the stepladder are locked into place.  Ask someone to hold it for  you, if possible.  Clearly mark and make sure that you can see:  Any grab bars or handrails.  First and last steps.  Where the edge of each step is.  Use tools that help you move around (mobility aids) if they are needed. These include:  Canes.  Walkers.  Scooters.  Crutches.  Turn on the lights when you go into a dark area. Replace any light bulbs as soon as they burn out.  Set up your furniture so you have a clear path. Avoid moving your furniture around.  If any of your floors are uneven, fix them.  If there are any pets around you, be aware of where they are.  Review your medicines with your doctor. Some medicines can make you feel dizzy. This can increase your chance of falling. Ask your doctor what other things that you can do to help prevent falls. This information is not intended to replace advice given to you by your health care provider. Make sure you discuss any questions you have with your health care provider. Document Released: 07/18/2009 Document Revised: 02/27/2016 Document Reviewed: 10/26/2014 Elsevier Interactive Patient Education  2017 [employer].

## 2020-07-24 ENCOUNTER — Encounter: Payer: Self-pay | Admitting: Family

## 2020-07-24 ENCOUNTER — Telehealth: Payer: Self-pay | Admitting: *Deleted

## 2020-07-24 ENCOUNTER — Ambulatory Visit: Payer: Medicare Other | Admitting: Family

## 2020-07-24 VITALS — BP 138/82 | HR 90 | Ht 67.0 in | Wt 254.1 lb

## 2020-07-24 DIAGNOSIS — I2510 Atherosclerotic heart disease of native coronary artery without angina pectoris: Secondary | ICD-10-CM

## 2020-07-24 DIAGNOSIS — R55 Syncope and collapse: Secondary | ICD-10-CM | POA: Diagnosis not present

## 2020-07-24 DIAGNOSIS — I25118 Atherosclerotic heart disease of native coronary artery with other forms of angina pectoris: Secondary | ICD-10-CM

## 2020-07-24 DIAGNOSIS — R059 Cough, unspecified: Secondary | ICD-10-CM

## 2020-07-24 DIAGNOSIS — D649 Anemia, unspecified: Secondary | ICD-10-CM

## 2020-07-24 DIAGNOSIS — I255 Ischemic cardiomyopathy: Secondary | ICD-10-CM

## 2020-07-24 DIAGNOSIS — I5042 Chronic combined systolic (congestive) and diastolic (congestive) heart failure: Secondary | ICD-10-CM

## 2020-07-24 DIAGNOSIS — E785 Hyperlipidemia, unspecified: Secondary | ICD-10-CM

## 2020-07-24 DIAGNOSIS — Z794 Long term (current) use of insulin: Secondary | ICD-10-CM

## 2020-07-24 DIAGNOSIS — E119 Type 2 diabetes mellitus without complications: Secondary | ICD-10-CM

## 2020-07-24 MED ORDER — FUROSEMIDE 20 MG PO TABS: 20.0000 mg | ORAL_TABLET | ORAL | 3 refills | Status: DC

## 2020-07-24 MED ORDER — BENZONATATE 100 MG PO CAPS: 200.0000 mg | ORAL_CAPSULE | Freq: Three times a day (TID) | ORAL | 1 refills | Status: DC | PRN

## 2020-07-24 NOTE — Progress Notes (Signed)
Lvm to schedule sooner appt with Dr Ancil Boozer for syncope

## 2020-07-24 NOTE — Patient Instructions (Addendum)
Medication Instructions:  Your physician has recommended you make the following change in your medication:   START Tessalon 200mg  three times per day as needed for cough  *If you need a refill on your cardiac medications before your next appointment, please call your pharmacy*  Lab Work: Your physician recommends that you return for lab work in one month at the [facility] for [facility] [facility]. This will be the week of November 15th - 19th. You do not need an appointment and do not need to be fasting.   If you have labs (blood work) drawn today and your tests are completely normal, you will receive your results only by: [name] MyChart Message (if you have MyChart) OR . A paper copy in the mail If you have any lab test that is abnormal or we need to change your treatment, we will call you to review the results.   Testing/Procedures: Your EKG today was stable compared to previous.  Follow-Up: At [facility], you and your health needs are our priority.  As part of our continuing mission to provide you with exceptional heart care, we have created designated Provider Care Teams.  These Care Teams include your primary Cardiologist (physician) and Advanced Practice Providers (APPs -  Physician Assistants and Nurse Practitioners) who all work together to provide you with the care you need, when you need it.  We recommend signing up for the patient portal called "MyChart".  Sign up information is provided on this After Visit Summary.  MyChart is used to connect with patients for Virtual Visits (Telemedicine).  Patients are able to view lab/test results, encounter notes, upcoming appointments, etc.  Non-urgent messages can be sent to your provider as well.   To learn more about what you can do with MyChart, go to [email or web address].    Your next appointment:   2-3  month(s)  The format for your next appointment:   In Person  Provider:   You may see [name], MD or one of the following  Advanced Practice Providers on your designated Care Team:    Murray Hodgkins, NP  Laurann Montana, NP  Christell Faith, PA-C  Marrianne Mood, PA-C  Cadence Kathlen Mody, [place]   Other Instructions  Iron-Rich Diet  Iron is a mineral that helps your body to produce hemoglobin. Hemoglobin is a protein in red blood cells that carries oxygen to your body's tissues. Eating too little iron may cause you to feel weak and tired, and it can increase your risk of infection. Iron is naturally found in many foods, and many foods have iron added to them (iron-fortified foods). You may need to follow an iron-rich diet if you do not have enough iron in your body due to certain medical conditions. The amount of iron that you need each day depends on your age, your sex, and any medical conditions you have. Follow instructions from your health care provider or a diet and nutrition specialist (dietitian) about how much iron you should eat each day. What are tips for following this plan? Reading food labels  Check food labels to see how many milligrams (mg) of iron are in each serving. Cooking  Cook foods in pots and pans that are made from iron.  Take these steps to make it easier for your body to absorb iron from certain foods: ? Soak beans overnight before cooking. ? Soak whole grains overnight and drain them before using. ? Ferment flours before baking, such as by using yeast in bread dough. Meal  planning  When you eat foods that contain iron, you should eat them with foods that are high in vitamin C. These include oranges, peppers, tomatoes, potatoes, and mango. Vitamin C helps your body to absorb iron. General information  Take iron supplements only as told by your health care provider. An overdose of iron can be life-threatening. If you were prescribed iron supplements, take them with orange juice or a vitamin C supplement.  When you eat iron-fortified foods or take an iron supplement, you should also  eat foods that naturally contain iron, such as meat, poultry, and fish. Eating naturally iron-rich foods helps your body to absorb the iron that is added to other foods or contained in a supplement.  Certain foods and drinks prevent your body from absorbing iron properly. Avoid eating these foods in the same meal as iron-rich foods or with iron supplements. These foods include: ? Coffee, black tea, and red wine. ? Milk, dairy products, and foods that are high in calcium. ? Beans and soybeans. ? Whole grains. What foods should I eat? Fruits Prunes. Raisins. Eat fruits high in vitamin C, such as oranges, grapefruits, and strawberries, alongside iron-rich foods. Vegetables Spinach (cooked). Green peas. Broccoli. Fermented vegetables. Eat vegetables high in vitamin C, such as leafy greens, potatoes, bell peppers, and tomatoes, alongside iron-rich foods. Grains Iron-fortified breakfast cereal. Iron-fortified whole-wheat bread. Enriched rice. Sprouted grains. Meats and other proteins Beef liver. Oysters. Beef. Shrimp. [place]. Chicken. [place]. Sardines. Chickpeas. Nuts. Tofu. Pumpkin seeds. Beverages Tomato juice. Fresh orange juice. Prune juice. Hibiscus tea. Fortified instant breakfast shakes. Sweets and desserts Blackstrap molasses. Seasonings and condiments Tahini. Fermented soy sauce. Other foods Wheat germ. The items listed above may not be a complete list of recommended foods and beverages. Contact a dietitian for more information. What foods should I avoid? Grains Whole grains. Bran cereal. Bran flour. Oats. Meats and other proteins Soybeans. Products made from soy protein. Black beans. Lentils. Mung beans. Split peas. Dairy Milk. Cream. Cheese. Yogurt. Cottage cheese. Beverages Coffee. Black tea. Red wine. Sweets and desserts Cocoa. Chocolate. Ice cream. Other foods Basil. Oregano. Large amounts of parsley. The items listed above may not be a complete list of foods and  beverages to avoid. Contact a dietitian for more information. Summary  Iron is a mineral that helps your body to produce hemoglobin. Hemoglobin is a protein in red blood cells that carries oxygen to your body's tissues.  Iron is naturally found in many foods, and many foods have iron added to them (iron-fortified foods).  When you eat foods that contain iron, you should eat them with foods that are high in vitamin C. Vitamin C helps your body to absorb iron.  Certain foods and drinks prevent your body from absorbing iron properly, such as whole grains and dairy products. You should avoid eating these foods in the same meal as iron-rich foods or with iron supplements. This information is not intended to replace advice given to you by your health care provider. Make sure you discuss any questions you have with your health care provider. Document Revised: 09/03/2017 Document Reviewed: 08/17/2017 Elsevier Patient Education  [place].   Orthostatic Hypotension Blood pressure is a measurement of how strongly, or weakly, your blood is pressing against the walls of your arteries. Orthostatic hypotension is a sudden drop in blood pressure that happens when you quickly change positions, such as when you get up from sitting or lying down. Arteries are blood vessels that carry blood from your  heart throughout your body. When blood pressure is too low, you may not get enough blood to your brain or to the rest of your organs. This can cause weakness, light-headedness, rapid heartbeat, and fainting. This can last for just a few seconds or for up to a few minutes. Orthostatic hypotension is usually not a serious problem. However, if it happens frequently or gets worse, it may be a sign of something more serious. What are the causes? This condition may be caused by:  Sudden changes in posture, such as standing up quickly after you have been sitting or lying down.  Blood loss.  Loss of body fluids  (dehydration).  Heart problems.  Hormone (endocrine) problems.  Pregnancy.  Severe infection.  Lack of certain nutrients.  Severe allergic reactions (anaphylaxis).  Certain medicines, such as blood pressure medicine or medicines that make the body lose excess fluids (diuretics). Sometimes, this condition can be caused by not taking medicine as directed, such as taking too much of a certain medicine. What increases the risk? The following factors may make you more likely to develop this condition:  Age. Risk increases as you get older.  Conditions that affect the heart or the central nervous system.  Taking certain medicines, such as blood pressure medicine or diuretics.  Being pregnant. What are the signs or symptoms? Symptoms of this condition may include:  Weakness.  Light-headedness.  Dizziness.  Blurred vision.  Fatigue.  Rapid heartbeat.  Fainting, in severe cases. How is this diagnosed? This condition is diagnosed based on:  Your medical history.  Your symptoms.  Your blood pressure measurement. Your health care provider will check your blood pressure when you are: ? Lying down. ? Sitting. ? Standing. A blood pressure reading is recorded as two numbers, such as "120 over 80" (or 120/80). The first ("top") number is called the systolic pressure. It is a measure of the pressure in your arteries as your heart beats. The second ("bottom") number is called the diastolic pressure. It is a measure of the pressure in your arteries when your heart relaxes between beats. Blood pressure is measured in a unit called mm Hg. Healthy blood pressure for most adults is 120/80. If your blood pressure is below 90/60, you may be diagnosed with hypotension. Other information or tests that may be used to diagnose orthostatic hypotension include:  Your other vital signs, such as your heart rate and temperature.  Blood tests.  Tilt table test. For this test, you will be  safely secured to a table that moves you from a lying position to an upright position. Your heart rhythm and blood pressure will be monitored during the test. How is this treated? This condition may be treated by:  Changing your diet. This may involve eating more salt (sodium) or drinking more water.  Taking medicines to raise your blood pressure.  Changing the dosage of certain medicines you are taking that might be lowering your blood pressure.  Wearing compression stockings. These stockings help to prevent blood clots and reduce swelling in your legs. In some cases, you may need to go to the hospital for:  Fluid replacement. This means you will receive fluids through an IV.  Blood replacement. This means you will receive donated blood through an IV (transfusion).  Treating an infection or heart problems, if this applies.  Monitoring. You may need to be monitored while medicines that you are taking wear off. Follow these instructions at home: Eating and drinking   Drink enough  fluid to keep your urine pale yellow.  Eat a healthy diet, and follow instructions from your health care provider about eating or drinking restrictions. A healthy diet includes: ? Fresh fruits and vegetables. ? Whole grains. ? Lean meats. ? Low-fat dairy products.  Eat extra salt only as directed. Do not add extra salt to your diet unless your health care provider told you to do that.  Eat frequent, small meals.  Avoid standing up suddenly after eating. Medicines  Take over-the-counter and prescription medicines only as told by your health care provider. ? Follow instructions from your health care provider about changing the dosage of your current medicines, if this applies. ? Do not stop or adjust any of your medicines on your own. General instructions   Wear compression stockings as told by your health care provider.  Get up slowly from lying down or sitting positions. This gives your blood  pressure a chance to adjust.  Avoid hot showers and excessive heat as directed by your health care provider.  Return to your normal activities as told by your health care provider. Ask your health care provider what activities are safe for you.  Do not use any products that contain nicotine or tobacco, such as cigarettes, e-cigarettes, and chewing tobacco. If you need help quitting, ask your health care provider.  Keep all follow-up visits as told by your health care provider. This is important. Contact a health care provider if you:  Vomit.  Have diarrhea.  Have a fever for more than 2-3 days.  Feel more thirsty than usual.  Feel weak and tired. Get help right away if you:  Have chest pain.  Have a fast or irregular heartbeat.  Develop numbness in any part of your body.  Cannot move your arms or your legs.  Have trouble speaking.  Become sweaty or feel light-headed.  Faint.  Feel short of breath.  Have trouble staying awake.  Feel confused. Summary  Orthostatic hypotension is a sudden drop in blood pressure that happens when you quickly change positions.  Orthostatic hypotension is usually not a serious problem.  It is diagnosed by having your blood pressure taken lying down, sitting, and then standing.  It may be treated by changing your diet or adjusting your medicines. This information is not intended to replace advice given to you by your health care provider. Make sure you discuss any questions you have with your health care provider. Document Revised: 03/17/2018 Document Reviewed: 03/17/2018 Elsevier Patient Education  [place].

## 2020-07-24 NOTE — Chronic Care Management (AMB) (Signed)
  Chronic Care Management   Outreach Note  07/24/2020 Name: [name] MRN: [id number] DOB: [birth year]  [name] is a 59 y.o. year old male who is a primary care patient of [name], MD. I reached out to [name] by phone today in response to a referral sent by Mr. Kris Hartmann Berber's PCP, [name], MD     An unsuccessful telephone outreach was attempted today. The patient was referred to the case management team for assistance with care management and care coordination.   Follow Up Plan: A HIPAA compliant phone message was left for the patient providing contact information and requesting a return call. The care management team will reach out to the patient again over the next 7 days. If patient returns call to provider office, please advise to call Sharon at [phone].  [place], [place] [address] Direct Dial: [phone] [email or web address]: [email or web address]

## 2020-07-24 NOTE — Progress Notes (Signed)
Office Visit    Patient Name: [name] Date of Encounter: 07/24/2020  Primary Care Provider:  [name], MD Primary Cardiologist:  [name], MD Electrophysiologist:  None   Chief Complaint    [name] is a 59 y.o. male with a hx of CAD, chronic systolic heart failure, DM2, HTN, HLD, myasthenia gravis, generalized pain due to reported Lyme disease and obesity presents today for syncope   Past Medical History    Past Medical History:  Diagnosis Date  . Allergy    dust, seasonal (worse in the fall).  . 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 Followed by Pain Specialist in [place], back and neck  . Asthma    BRONCHITIS  . Cataract    First Dx in 2012  . Chronic combined systolic and diastolic congestive heart failure ([place])    a. 03/2018 Echo: EF 30-35%, ant, antlat, apical AK, Gr1 DD; b. 07/2018 Echo: EF 35-40%, anteroseptal, apical, and ant HK. Gr1 DD; c. 09/2019 TEE: EF 40-45%.  . Coronary artery disease    a. Prior Ant MI->s/p multiple stents placed in the LAD and right coronary artery ([place]); b. 2016 Cath: reportedly nonobs dzs;  c. 04/2018 Cath/PCI: LM nl, LAD 20p, patent mid stent, LCX 31m(3.25x15 [place] DES), OM1 nl, OM2 50, OM3 40 w/ patent stent, RCA 40p, 58m, 40d w/ patent stent in RPDA, RPAV 60, EF 25-35%. 2+MR; d. 02/2019 MV: Apical scar, no isch, EF 30-44%.  . Diabetes mellitus without complication ([place])    TYPE 2  . Diabetic peripheral neuropathy (HCC)    feet and hands  . FUO (fever of unknown origin) 08/03/2018  . GERD (gastroesophageal reflux disease)   . Headache    muscle tension  . Hyperlipidemia   . Hypertension    CONTROLLED ON MEDS  . Insomnia   . Ischemic cardiomyopathy    a. 03/2018 Echo: EF 30-35%, ant, antlat, apical AK, Gr1 DD, mild MR, mildly dil LA; b. 07/2018 Echo: EF 35-40%; c. 09/2018 TEE: EF 40-45%, antsept, ant HK.  [name] Knee pain, acute 05/06/2020  . Left arm weakness 10/04/2019  . Left leg weakness 12/01/2019  . Lyme disease     Chronic  . Myasthenia gravis ([place])   . Myocardial infarction ([place]) 2010  . Seasonal allergies   . Sepsis ([place])    a.07/2018 - unknown source. TEE neg for veg 09/2019.  [name] Sleep apnea    CPAP   Past Surgical History:  Procedure Laterality Date  . BILATERAL CARPAL TUNNEL RELEASE Bilateral L in 2012 and R in 2013  . CARDIAC CATHETERIZATION     Several Caths, most recent in  March 2016.  [name] COLONOSCOPY WITH PROPOFOL N/A 01/10/2016   Procedure: COLONOSCOPY WITH PROPOFOL;  Surgeon: [name], MD;  Location: [facility];  Service: Endoscopy;  Laterality: N/A;  . CORONARY ANGIOPLASTY    . CORONARY STENT INTERVENTION N/A 04/25/2018   Procedure: CORONARY STENT INTERVENTION;  Surgeon: [name], MD;  Location: [facility];  Service: Cardiovascular;  Laterality: N/A;  . ESOPHAGOGASTRODUODENOSCOPY (EGD) WITH PROPOFOL N/A 01/10/2016   Procedure: ESOPHAGOGASTRODUODENOSCOPY (EGD) WITH PROPOFOL;  Surgeon: [name], MD;  Location: [facility];  Service: Endoscopy;  Laterality: N/A;  . EYE SURGERY Bilateral 2012   cataract/bilateral vitrectomies  . LEFT HEART CATH AND CORONARY ANGIOGRAPHY Left 04/25/2018   Procedure: LEFT HEART CATH AND CORONARY ANGIOGRAPHY;  Surgeon: [name], MD;  Location: [facility];  Service: Cardiovascular;  Laterality: Left;  . TEE WITHOUT CARDIOVERSION N/A 09/05/2018   Procedure: TRANSESOPHAGEAL ECHOCARDIOGRAM (TEE);  Surgeon: [name], MD;  Location: [facility];  Service: Cardiovascular;  Laterality: N/A;  . TONSILLECTOMY AND ADENOIDECTOMY     As a child  . TUNNELED VENOUS CATHETER PLACEMENT     removed    Allergies  Allergies  Allergen Reactions  . Azathioprine Other (See Comments)    Azathioprine hypersensitivity reaction  . Novolog [Insulin Aspart] Hives    History of Present Illness    [name] is a 59 y.o. male with a hx of CAD, chronic systolic heart failure, DM2, HTN, HLD, myasthenia gravis, generalized  pain due to reported Lyme disease and obesity last seen 02/01/20 by Dr. Fletcher Anon.  Extensive cardiac history with total of 10 stents (in LAD and RCA) starting in 2009 after presenting with MI. Worsening heart failure noted in June of 2019. Echo with LVEF 30%, akinesis of anterior, anterolateral, apical myocardium, mild MR. Cardiac cath 04/2018 with patent stent in LAD, OMs, and RCA. New LCx stenosis which was stented. EF 25-30% and LVEDP severely elevated. Entresto and Lasix were added to medication management. TEE 07/2018 in setting of sepsis with no evidence of endocarditis and EF 40-45%.   Delene Loll was stopped due to hypotension and dizziness. Tolerated Losartan well. Due to persistent tachycardia his Carvedilol was transitioned to Metoprolol. When seen 01/2020 his metoprolol tartrate was changed to Toprol 100mg  daily. He was recommended for lower extremity arterial doppler to assess for PAD which were normal.   Seen in ED 07/19/20 after syncopal episode after coughing causing him to strike his head on edge of bathtub.  Lab work with K4.3, creatinine one-point, RBC 3.76, hemoglobin 9.7 HCT 31.8.  Covid swab unremarkable, urinalysis unremarkable with exception of glucose of 150.  CXR no acute findings.  CT cervical spine no acute fracture or static subluxation of cervical spine  Presents today for follow-up with his wife.  Denies recurrent near syncope nor syncope.  Endorses congestion for the last month for which he has been taking Mucinex regularly and Robitussin-DM when his coughing gets really bad.  He plans to call pulmonology to schedule follow-up appointment.  Endorses occasional lightheadedness with position changes.  Orthostatic vital signs today were positive, detailed below.  We discussed the importance of adequate hydration and low balance with less than 2 L of fluid per day in the setting of heart failure.  He drinks 2-3 bottles of Diet Coke per day.  We discussed that caffeine is a diuretic.   Checks his BP at home with readings consistently 120s over 80s.  Does endorse over the last 2 weeks that he has had a lower appetite in the setting of his respiratory illness and has not been eating or drinking as much.  When asked about syncopal episode tells me he had a strong coughing fit and had a brief loss of consciousness.  We discussed vasovagal etiology.  He denies palpitations, tachycardia.  Orthostatic vital signs 07/24/20 HR BP Symptoms  Lying  91 bpm  125/77   Sitting  87 bpm  125/75  short of breath  Standing 0 minutes  92 bpm  104/67   Standing 3 minutes  91 bpm  113/72     EKGs/Labs/Other Studies Reviewed:   The following studies were reviewed today:   EKG:  EKG is ordered today.  The ekg ordered today demonstrates NSR 90 bpm with RBBB and possible old inferior infarct and anterolateral infarct. No  acute ST/T wave changes. Stable compared to previous EKG.  Recent Labs: 08/07/2019: BNP 84.9 10/16/2019: ALT 18; TSH 3.00 07/18/2020: BUN 20; Creatinine, Ser 1.08; Hemoglobin 9.7; Platelets 239; Potassium 4.3; Sodium 138  Recent Lipid Panel    Component Value Date/Time   CHOL 86 10/16/2019 1424   CHOL 113 01/17/2018 0819   TRIG 126 10/16/2019 1424   HDL 31 (L) 10/16/2019 1424   HDL 33 (L) 01/17/2018 0819   CHOLHDL 2.8 10/16/2019 1424   VLDL 26 11/29/2017 1154   LDLCALC 34 10/16/2019 1424   Home Medications   Current Meds  Medication Sig  . albuterol (PROVENTIL HFA;VENTOLIN HFA) 108 (90 Base) MCG/ACT inhaler Inhale 2 puffs into the lungs every 6 (six) hours as needed for wheezing or shortness of breath.   [name] albuterol (PROVENTIL) (2.5 MG/3ML) 0.083% nebulizer solution Take 2.5 mg by nebulization every 6 (six) hours as needed for wheezing or shortness of breath.   [name] aspirin EC 81 MG tablet Take 1 tablet (81 mg total) by mouth daily.  . betamethasone valerate ointment (VALISONE) 0.1 % Apply topically.   . clopidogrel (PLAVIX) 75 MG tablet Take 1 tablet (75 mg total) by  mouth daily with breakfast.  . cyclobenzaprine (FLEXERIL) 10 MG tablet Take 10 mg by mouth 2 (two) times daily as needed.  [name] dexlansoprazole (DEXILANT) 60 MG capsule Take 1 capsule (60 mg total) by mouth daily.  . DULoxetine (CYMBALTA) 60 MG capsule TAKE 1 CAPSULE(60 MG) BY MOUTH DAILY  . Fluticasone-Umeclidin-Vilant (TRELEGY ELLIPTA) 100-62.5-25 MCG/INH AEPB Inhale 1 each into the lungs daily. In place of breo  . furosemide (LASIX) 20 MG tablet Take 1 tablet (20 mg total) by mouth every other day.  . gabapentin (NEURONTIN) 300 MG capsule Take 1 capsule (300 mg total) by mouth 3 (three) times daily.  [name] gabapentin (NEURONTIN) 800 MG tablet Take 800 mg by mouth at bedtime.  . insulin lispro (HUMALOG KWIKPEN) 100 UNIT/ML KiwkPen Inject 7-8 Units into the skin 3 (three) times daily. Reported on 12/02/2015/ sliding scale 1 unit for every 8 units of carbs; and 1 unit for every 20 above 120  . Insulin Pen Needle (FIFTY50 PEN NEEDLES) 32G X 4 MM MISC Inject 1 each into the skin as needed.  [name] LANTUS SOLOSTAR 100 UNIT/ML Solostar Pen Inject 25-30 Units into the skin daily at 10 pm.   . levocetirizine (XYZAL) 5 MG tablet TAKE 1 TABLET(5 MG) BY MOUTH EVERY EVENING  . levothyroxine (SYNTHROID) 25 MCG tablet TAKE 1 TABLET(25 MCG) BY MOUTH DAILY BEFORE BREAKFAST  . liraglutide (VICTOZA) 18 MG/3ML SOPN 1.8 units every morning (Patient taking differently: Inject 1.8 mg into the skin daily. )  . loratadine (CLARITIN) 10 MG tablet Take 10 mg by mouth daily.   [name] losartan (COZAAR) 25 MG tablet TAKE 1 TABLET(25 MG) BY MOUTH DAILY  . metFORMIN (GLUCOPHAGE) 1000 MG tablet Take 1,000 mg by mouth 2 (two) times daily with a meal.  . montelukast (SINGULAIR) 10 MG tablet Take 10 mg by mouth at bedtime.  . Multiple Vitamins-Minerals (MENS MULTI VITAMIN & MINERAL PO) Take 1 tablet by mouth daily.  . naloxone (NARCAN) nasal spray 4 mg/0.1 mL For excess sedation from opioids  . nitroGLYCERIN (NITROSTAT) 0.3 MG SL tablet Place 1  tablet (0.3 mg total) under the tongue every 5 (five) minutes as needed for chest pain. Maximum 3 doses.  [name] oxyCODONE (ROXICODONE) 5 MG immediate release tablet Take 2 tablets (10 mg total) by mouth 4 (four) times  daily.  Derrill Memo ON 08/12/2020] oxyCODONE (ROXICODONE) 5 MG immediate release tablet Take 2 tablets (10 mg total) by mouth every 6 (six) hours as needed for severe pain.  [name] pyridostigmine (MESTINON) 60 MG tablet Take 1 tablet by mouth 3 (three) times daily.  . rosuvastatin (CRESTOR) 5 MG tablet Take 5 mg by mouth daily.  . tamsulosin (FLOMAX) 0.4 MG CAPS capsule TAKE 2 CAPSULES BY MOUTH DAILY    Review of Systems  All other systems reviewed and are otherwise negative except as noted above.  Physical Exam    VS:  BP 138/82 (BP Location: Left Arm, Patient Position: Sitting, Cuff Size: Normal)   Pulse 90   Ht 5\' 7"  (1.702 m)   Wt 254 lb 2 oz (115.3 kg)   SpO2 95%   BMI 39.80 kg/m  , BMI Body mass index is 39.8 kg/m.  Wt Readings from Last 3 Encounters:  07/24/20 254 lb 2 oz (115.3 kg)  07/23/20 253 lb (114.8 kg)  07/18/20 240 lb (108.9 kg)    GEN: Well nourished, overweight, well developed, in no acute distress. HEENT: normal. Neck: Supple, no JVD, carotid bruits, or masses. Cardiac: RRR, no murmurs, rubs, or gallops. No clubbing, cyanosis. Trace bilateral pedal edema.  Radials/DP/PT 2+ and equal bilaterally.  Respiratory:  Respirations regular and unlabored. Inspiratory and expiratory wheezing noted throughout all lung fields.  GI: Soft, nontender, nondistended. MS: No deformity or atrophy. Skin: Warm and dry, no rash. Neuro:  Strength and sensation are intact. Psych: Normal affect.  Assessment & Plan    1. Syncope -Recent ED visit with syncope in the setting of coughing and vagal response. Denies recurrent near syncope nor syncope. Occasional lightheadedness with position changes. No palpitations. No indication for ZIO or echo at this time though if symptoms recur,  would recommend. Encouraged to remain adequately hydrated. Rx for tessalon perles for cough provided and encouraged to f/u with pulmonology.   2. Anemia - 02/03/19 Hb 11.5, Hct 35.9. 07/18/20 Hb 9.7, Hct 31.8. Denies hematuria, melena.  Encouraged dietary sources of iron. Plan to check CBC and anemia panel in 4 weeks. Will route note to PCP to make her aware.   3. CAD - EKG today stable compared to previous with SR and RBBB with previous inferior/anterolateral infarct.  No symptoms concerning for angina.  No indication for ischemic evaluation at this time.  GDMT includes aspirin, beta-blocker, statin.  4. 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 No SOB, orthopnea, PND. Trace LE edema. Continue Furosemide 20mg  daily. Continue Metoprolol and Losartan (previous intolerance to Entresto and Carvedilol).  Continue low-sodium, heartily diet.  Continue regular cardiovascular exercise.  Continue less than 2 L fluid restriction.  5. HTN - BP well controlled. Continue current antihypertensive regimen. Notes some intermittent orthostasis symptoms. Encouraged to make position changes slowly.   6. HLD - Lipid panel 10/2019 with LDL 34. Continue Rosuvastatin 5 mg daily and Zetia 10 mg daily.  7. DM2 - Continue to follow with PCP and endocrinology.  8. OSA - CPAP compliance encouraged.  9. Asthma - Follows with Dr. Raul Del of pulmonology.  Congestion for the last month not improved with Mucinex.  Has been using Robitussin for cough without much improvement.  Provided with prescription for Tessalon Perles. Encouraged to schedule visit with Dr. Raul Del as he had wheezing notable in all lung fields. CXR 07/19/20 no acute findings.    Disposition: Follow up in 2-3 months  with Dr. Fletcher Anon or APP.   Signed, Loel Dubonnet, NP 07/24/2020, 10:58 AM [place]

## 2020-07-24 NOTE — Chronic Care Management (AMB) (Signed)
  Chronic Care Management   Note  07/24/2020 Name: JOHNTAVIOUS FRANCOM MRN: [id number] DOB: Jun 09, 1961  CORRIN HINGLE is a 59 y.o. year old male who is a primary care patient of [name], MD. I reached out to [name] by phone today in response to a referral sent by Mr. Kris Hartmann Grim's PCP, [name], MD.      Mr. Caffrey was given information about Chronic Care Management services today including:  1. CCM service includes personalized support from designated clinical staff supervised by his physician, including individualized plan of care and coordination with other care providers 2. 24/7 contact phone numbers for assistance for urgent and routine care needs. 3. Service will only be billed when office clinical staff spend 20 minutes or more in a month to coordinate care. 4. Only one practitioner may furnish and bill the service in a calendar month. 5. The patient may stop CCM services at any time (effective at the end of the month) by phone call to the office staff. 6. The patient will be responsible for cost sharing (co-pay) of up to 20% of the service fee (after annual deductible is met).  Patient agreed to services and verbal consent obtained.   Follow up plan: Telephone appointment with care management team member scheduled for: 07/26/2020 with RN CM and 08/06/2020 with PharmD.  [place] Management  Direct Dial: [phone]

## 2020-07-26 ENCOUNTER — Telehealth: Payer: Medicare Other

## 2020-07-26 ENCOUNTER — Telehealth: Payer: Self-pay

## 2020-07-26 NOTE — Telephone Encounter (Signed)
  Chronic Care Management   Outreach Note  07/26/2020 Name: [name] MRN: [id number] DOB: Mar 07, 1961  Primary Care Provider: [name], MD Reason for referral : Chronic Care Management   An unsuccessful telephone outreach was attempted today. Mr. Steckman was referred to the case management team for assistance with care management and care coordination.    PLAN A HIPAA compliant voice message was left today requesting a return call.    Cristy Friedlander [facility] Care Management [facility] [phone]

## 2020-08-01 DIAGNOSIS — G4733 Obstructive sleep apnea (adult) (pediatric): Secondary | ICD-10-CM | POA: Diagnosis not present

## 2020-08-01 DIAGNOSIS — R059 Cough, unspecified: Secondary | ICD-10-CM | POA: Diagnosis not present

## 2020-08-01 DIAGNOSIS — R062 Wheezing: Secondary | ICD-10-CM | POA: Diagnosis not present

## 2020-08-01 DIAGNOSIS — R0600 Dyspnea, unspecified: Secondary | ICD-10-CM | POA: Diagnosis not present

## 2020-08-02 ENCOUNTER — Other Ambulatory Visit: Payer: Self-pay | Admitting: Family Medicine

## 2020-08-02 ENCOUNTER — Telehealth: Payer: Self-pay | Admitting: *Deleted

## 2020-08-02 DIAGNOSIS — E063 Autoimmune thyroiditis: Secondary | ICD-10-CM

## 2020-08-02 DIAGNOSIS — Z794 Long term (current) use of insulin: Secondary | ICD-10-CM | POA: Diagnosis not present

## 2020-08-02 DIAGNOSIS — E1165 Type 2 diabetes mellitus with hyperglycemia: Secondary | ICD-10-CM | POA: Diagnosis not present

## 2020-08-02 NOTE — Chronic Care Management (AMB) (Signed)
  Care Management   Note  08/02/2020 Name: [name] MRN: [id number] DOB: 04-16-1961  [name] is a 59 y.o. year old male who is a primary care patient of [name], MD and is actively engaged with the care management team. I reached out to [name] by phone today to assist with canceling  an initial visit with the Pharmacist  Follow up plan: Unsuccessful telephone outreach attempt made. A HIPAA compliant phone message was left for the patient providing contact information and requesting a return call.  If patient returns call to provider office, please advise to call [place] at [phone].  [place] Management

## 2020-08-05 ENCOUNTER — Ambulatory Visit: Payer: Medicare Other | Attending: Internal Medicine

## 2020-08-05 ENCOUNTER — Other Ambulatory Visit: Payer: Medicare Other

## 2020-08-05 DIAGNOSIS — Z23 Encounter for immunization: Secondary | ICD-10-CM

## 2020-08-05 NOTE — Progress Notes (Signed)
° °  Covid-19 Vaccination Clinic  Name:  [name]    MRN: [id number] DOB: June 28, 1961  08/05/2020  [name] was observed post Covid-19 immunization for 15 minutes without incident. He was provided with Vaccine Information Sheet and instruction to access the V-Safe system.   [name] was instructed to call 911 with any severe reactions post vaccine:  Difficulty breathing   Swelling of face and throat   A fast heartbeat   A bad rash all over body   Dizziness and weakness

## 2020-08-06 ENCOUNTER — Telehealth: Payer: Medicare Other

## 2020-08-07 ENCOUNTER — Telehealth: Payer: Self-pay

## 2020-08-07 ENCOUNTER — Other Ambulatory Visit: Payer: Self-pay | Admitting: Cardiovascular Disease

## 2020-08-07 NOTE — Telephone Encounter (Signed)
  Chronic Care Management   Outreach Note  08/07/2020 Name: [name] MRN: [id number] DOB: February 01, 1961  Primary Care Provider: [name], MD Reason for referral : Chronic Care Management   A second unsuccessful telephone outreach was attempted today. [name] was referred to the case management team for assistance with care management and care coordination.     PLAN A HIPAA compliant voice message was left today requesting a return call.    Cristy Friedlander [facility] Care Management [facility] [phone]

## 2020-08-09 ENCOUNTER — Ambulatory Visit: Payer: Medicare Other | Admitting: Cardiovascular Disease

## 2020-08-09 NOTE — Chronic Care Management (AMB) (Signed)
  Care Management   Note  08/09/2020 Name: [name] MRN: [id number] DOB: [birth year]  [name] is a 59 y.o. year old male who is a primary care patient of [name], MD and is actively engaged with the care management team. I reached out to [name] by phone today to assist with re-scheduling an initial visit with the RN Case Manager  Follow up plan: Unsuccessful telephone outreach attempt made. A HIPAA compliant phone message was left for the patient providing contact information and requesting a return call.  The care management team will reach out to the patient again over the next 7 days.  If patient returns call to provider office, please advise to call [place] at [phone].  [place] Management

## 2020-08-16 NOTE — Chronic Care Management (AMB) (Signed)
  Care Management   Note  08/16/2020 Name: [name] MRN: [id number] DOB: January 11, 1961  [name] is a 59 y.o. year old male who is a primary care patient of [name], MD and is actively engaged with the care management team. I reached out to [name] by phone today to assist with re-scheduling an initial visit with the RN Case Manager.  Follow up plan: Unsuccessful telephone outreach attempt made. A HIPAA compliant phone message was left for the patient providing contact information and requesting a return call. The care management team will reach out to the patient again over the next 7 days. If patient returns call to provider office, please advise to call [place] at [phone].  [place] Management  Direct Dial: [phone]

## 2020-08-19 ENCOUNTER — Ambulatory Visit: Payer: Self-pay

## 2020-08-19 DIAGNOSIS — H35373 Puckering of macula, bilateral: Secondary | ICD-10-CM | POA: Diagnosis not present

## 2020-08-19 DIAGNOSIS — H31093 Other chorioretinal scars, bilateral: Secondary | ICD-10-CM | POA: Diagnosis not present

## 2020-08-19 DIAGNOSIS — E113592 Type 2 diabetes mellitus with proliferative diabetic retinopathy without macular edema, left eye: Secondary | ICD-10-CM | POA: Diagnosis not present

## 2020-08-19 DIAGNOSIS — E113511 Type 2 diabetes mellitus with proliferative diabetic retinopathy with macular edema, right eye: Secondary | ICD-10-CM | POA: Diagnosis not present

## 2020-08-19 NOTE — Chronic Care Management (AMB) (Signed)
  Chronic Care Management   Outreach Note  08/19/2020 Name: [name] MRN: [id number] DOB: 07/21/1961  Primary Care Provider: [name], MD Reason for referral : Chronic Care Management    Third unsuccessful telephone outreach was attempted today.  [name] was referred to the care management team for assistance with chronic care management and care coordination. His primary care provider will be notified of our unsuccessful attempts to maintain contact. The care management team will gladly outreach at any time in the future if he is interested in receiving assistance.   PLAN The care management team will gladly follow up with [name] after the primary care provider has a conversation with him regarding recommendation for care management engagement and subsequent re-referral for care management services.    Cristy Friedlander [facility] Care Management [facility] [phone]

## 2020-08-22 NOTE — Chronic Care Management (AMB) (Signed)
  Chronic Care Management   Note  08/22/2020 Name: THORSTEN CLIMER MRN: [id number] DOB: [birth year]  [name] is a 59 y.o. year old male who is a primary care patient of [name], MD and is actively engaged with the care management team. I reached out to [name] by phone today to assist with re-scheduling an initial visit with the Pharmacist  Follow up plan: Telephone appointment with care management team member scheduled for:09/04/2020  [place] Management

## 2020-08-25 ENCOUNTER — Other Ambulatory Visit: Payer: Self-pay | Admitting: Cardiovascular Disease

## 2020-09-01 ENCOUNTER — Other Ambulatory Visit: Payer: Self-pay | Admitting: Family Medicine

## 2020-09-01 DIAGNOSIS — J4541 Moderate persistent asthma with (acute) exacerbation: Secondary | ICD-10-CM

## 2020-09-01 NOTE — Telephone Encounter (Signed)
Requested medication (s) are due for refill today: yes  Requested medication (s) are on the active medication list: yes  Last refill:  05/17/20  Future visit scheduled: yes  Notes to clinic:  med not assigned to a protocol   Requested Prescriptions  Pending Prescriptions Disp Refills   [place] 100-62.5-25 MCG/INH AEPB [Pharmacy Med Name: TRELEGY ELLIPTA 100-62.5MCG INH 30P] 60 each 2    Sig: INHALE 1 PUFF INTO THE LUNGS DAILY      Off-Protocol Failed - 09/01/2020  8:04 AM      Failed - Medication not assigned to a protocol, review manually.      Passed - Valid encounter within last 12 months    Recent Outpatient Visits           2 months ago Moderate persistent asthmatic bronchitis with acute exacerbation   [facility] Tow[facility] Malkin, MD   3 months ago Morbid obesity [facility])   [facility] [name], MD   7 months ago Chronic systolic CHF (congestive heart failure) [facility])   [facility] [name], MD   10 months ago Myasthenia gravis [facility])   [facility] [name], MD   1 year ago Hypothyroidism, acquired, autoimmune   [facility] [name], MD       Future Appointments             In 2 weeks [name], MD [facility], [place]   In 1 month Gilford Rile, Martie Lee, NP [place], [facility]   In 10 months  [facility], [facility]

## 2020-09-02 ENCOUNTER — Other Ambulatory Visit: Payer: Self-pay

## 2020-09-03 NOTE — Chronic Care Management (AMB) (Deleted)
Chronic Care Management Pharmacy  Name: [name]  MRN: [id number] DOB: November 05, 1960   Chief Complaint/ HPI  [name],  59 y.o. , male presents for his Initial CCM visit with the clinical pharmacist via telephone due to COVID-19 Pandemic.  PCP : [name], MD Patient Care Team: [name], MD as PCP - General (Family Medicine) [name], MD as PCP - Cardiology (Cardiology) Erby Pian, MD as Consulting Physician (Pulmonary Disease) Molli Barrows, MD as Consulting Physician (Anesthesiology) Lonia Farber, MD as Consulting Physician (Endocrinology) Cleotilde Neer, MD as Referring Physician (Neurology) [name], MD as Consulting Physician (Gastroenterology) Germaine Pomfret, [facility] (Pharmacist)  Patient's chronic conditions include: Hypertension, Hyperlipidemia, Diabetes, Heart Failure, Coronary Artery Disease, GERD, Asthma, BPH and Insomnia   Office Visits: 07/23/20: Patient presented to Clemetine Marker, LPN for AWV.  9/62/22: Patient presented to Dr. Roxan Hockey for asthmatic bronchitis exacerbation. Patient started on prednisone and azithromycin.  05/17/20: Patient presented to Dr. Ancil Boozer for follow-up. Breo, Vit B12 stopped. Patient started on Trelegy.   Consult Visit: 08/01/20: Patient presented to Dr. Raul Del (Pulmonology) for follow-up. Patient started on prednisone 24m BID and doxycycline 100 mg BID  07/24/20: Patient presented to CLaurann Montana NP (Cardiology) for follow-up. Patient started on benzonatate 200 mg TID for cough.  07/19/20: Patient presented to ED for Syncope.  03/21/20: Patient presented to Dr. OHonor Junes(Endocrinology) for follow-up. Investigating if patient qualifies for insulin pump.   Allergies  Allergen Reactions  . Azathioprine Other (See Comments)    Azathioprine hypersensitivity reaction  . Novolog [Insulin Aspart] Hives    Medications: Outpatient Encounter Medications as of 09/04/2020   Medication Sig Note  . albuterol (PROVENTIL HFA;VENTOLIN HFA) 108 (90 Base) MCG/ACT inhaler Inhale 2 puffs into the lungs every 6 (six) hours as needed for wheezing or shortness of breath.    .Marland Kitchenalbuterol (PROVENTIL) (2.5 MG/3ML) 0.083% nebulizer solution Take 2.5 mg by nebulization every 6 (six) hours as needed for wheezing or shortness of breath.    .Marland Kitchenaspirin EC 81 MG tablet Take 1 tablet (81 mg total) by mouth daily.   . benzonatate (TESSALON PERLES) 100 MG capsule Take 2 capsules (200 mg total) by mouth 3 (three) times daily as needed for cough.   . betamethasone valerate ointment (VALISONE) 0.1 % Apply topically.    . clopidogrel (PLAVIX) 75 MG tablet TAKE 1 TABLET(75 MG) BY MOUTH DAILY WITH BREAKFAST   . cyclobenzaprine (FLEXERIL) 10 MG tablet Take 10 mg by mouth 2 (two) times daily as needed.   .Marland Kitchendexlansoprazole (DEXILANT) 60 MG capsule Take 1 capsule (60 mg total) by mouth daily.   . DULoxetine (CYMBALTA) 60 MG capsule TAKE 1 CAPSULE(60 MG) BY MOUTH DAILY   . ezetimibe (ZETIA) 10 MG tablet Take 1 tablet (10 mg total) by mouth daily.   . Fluticasone-Umeclidin-Vilant (TRELEGY ELLIPTA) 100-62.5-25 MCG/INH AEPB Inhale 1 each into the lungs daily. In place of breo   . furosemide (LASIX) 20 MG tablet Take 1 tablet (20 mg total) by mouth every other day.   . gabapentin (NEURONTIN) 300 MG capsule Take 1 capsule (300 mg total) by mouth 3 (three) times daily.   .Marland Kitchengabapentin (NEURONTIN) 800 MG tablet Take 800 mg by mouth at bedtime.   . insulin lispro (HUMALOG KWIKPEN) 100 UNIT/ML KiwkPen Inject 7-8 Units into the skin 3 (three) times daily. Reported on 12/02/2015/ sliding scale 1 unit for every 8 units of carbs; and 1 unit for every 20  above 120   . Insulin Pen Needle (FIFTY50 PEN NEEDLES) 32G X 4 MM MISC Inject 1 each into the skin as needed.   [name] LANTUS SOLOSTAR 100 UNIT/ML Solostar Pen Inject 25-30 Units into the skin daily at 10 pm.    . levocetirizine (XYZAL) 5 MG tablet TAKE 1 TABLET(5 MG) BY  MOUTH EVERY EVENING   . levothyroxine (SYNTHROID) 25 MCG tablet TAKE 1 TABLET(25 MCG) BY MOUTH DAILY BEFORE BREAKFAST   . liraglutide (VICTOZA) 18 MG/3ML SOPN 1.8 units every morning (Patient taking differently: Inject 1.8 mg into the skin daily. )   . loratadine (CLARITIN) 10 MG tablet Take 10 mg by mouth daily.    [name] losartan (COZAAR) 25 MG tablet TAKE 1 TABLET(25 MG) BY MOUTH DAILY   . metFORMIN (GLUCOPHAGE) 1000 MG tablet Take 1,000 mg by mouth 2 (two) times daily with a meal.   . metoprolol succinate (TOPROL-XL) 100 MG 24 hr tablet Take 1 tablet (100 mg total) by mouth daily. Take with or immediately following a meal.   . montelukast (SINGULAIR) 10 MG tablet Take 10 mg by mouth at bedtime.   . Multiple Vitamins-Minerals (MENS MULTI VITAMIN & MINERAL PO) Take 1 tablet by mouth daily.   . naloxone (NARCAN) nasal spray 4 mg/0.1 mL For excess sedation from opioids 06/10/2018: PRN  . nitroGLYCERIN (NITROSTAT) 0.3 MG SL tablet Place 1 tablet (0.3 mg total) under the tongue every 5 (five) minutes as needed for chest pain. Maximum 3 doses.   [name] oxyCODONE (ROXICODONE) 5 MG immediate release tablet Take 2 tablets (10 mg total) by mouth every 6 (six) hours as needed for severe pain.   [name] pyridostigmine (MESTINON) 60 MG tablet Take 1 tablet by mouth 3 (three) times daily.   . rosuvastatin (CRESTOR) 5 MG tablet Take 5 mg by mouth daily.   . tamsulosin (FLOMAX) 0.4 MG CAPS capsule TAKE 2 CAPSULES BY MOUTH DAILY    No facility-administered encounter medications on file as of 09/04/2020.    Wt Readings from Last 3 Encounters:  07/24/20 254 lb 2 oz (115.3 kg)  07/23/20 253 lb (114.8 kg)  07/18/20 240 lb (108.9 kg)    Current Diagnosis/Assessment:    Goals Addressed   None    Hypertension   BP goal is:  {CHL HP UPSTREAM Pharmacist BP [phone]}  Office blood pressures are  BP Readings from Last 3 Encounters:  07/24/20 138/82  07/23/20 122/72  07/19/20 (!) 157/89   Patient checks BP at  home {CHL HP BP Monitoring Frequency:[phone]} Patient home BP readings are ranging: ***  Patient has failed these meds in the past: *** Patient is currently {CHL Controlled/Uncontrolled:[phone]} on the following medications:  . Furosemide 20 mg every other day  . Losartan 25 mg daily  . Metoprolol XL 100 mg daily   We discussed {CHL HP Upstream Pharmacy [phone]}  Plan  Continue {CHL HP Upstream Pharmacy [phone]}   Heart Failure   Managed by Dr. Fletcher Anon  Type: Combined Systolic and Diastolic  Last ejection fraction: 40-45% (09/05/18) NYHA Class: {CHL HP Upstream Pharm NYHA Class:[id number]} AHA HF Stage: {CHL HP Upstream Pharm AHA HF [phone]}  Patient has failed these meds in past: Entresto (Dizziness), Carvedilol (Tachycardia) Patient is currently {CHL Controlled/Uncontrolled:[phone]} on the following medications:  . Furosemide 20 mg every other day  . Losartan 25 mg daily  . Metoprolol XL 100 mg daily  We discussed {CHL HP Upstream Pharmacy [phone]}  Plan  Continue {CHL HP Upstream Pharmacy [phone]}  Hyperlipidemia  LDL goal < ***  Last lipids Lab Results  Component Value Date   CHOL 86 10/16/2019   HDL 31 (L) 10/16/2019   LDLCALC 34 10/16/2019   TRIG 126 10/16/2019   CHOLHDL 2.8 10/16/2019   Hepatic Function Latest Ref Rng & Units 10/16/2019 05/05/2019 08/03/2018  Total Protein 6.1 - 8.1 g/dL 6.1 5.9(L) 5.4(L)  Albumin 3.5 - 5.0 g/dL - - -  AST 10 - 35 U/L 17 16 -  ALT 9 - 46 U/L 18 14 -  Alk Phosphatase 38 - 126 U/L - - -  Total Bilirubin 0.2 - 1.2 mg/dL 0.4 0.4 -  Bilirubin, Direct 0.00 - 0.40 mg/dL - - -     The ASCVD Risk score ([place]., et al., 2013) failed to calculate for the following reasons:   The patient has a prior MI or stroke diagnosis   Patient has failed these meds in past: *** Patient is currently {CHL Controlled/Uncontrolled:[phone]} on the following medications:   . Ezetimibe 10 mg daily  . Rosuvastatin 5 mg daily  . Aspirin 81 mg daily  . Clopidogrel 75 mg daily   We discussed:  {CHL HP Upstream Pharmacy discussion:[phone]}  Stop zetia, increase rosuvastatin?   Plan  Continue {CHL HP Upstream Pharmacy FGHWE:[id number]}  Diabetes   Managed by Dr. Honor Junes   A1c goal {A1c goals:23924}  Recent Relevant Labs: Lab Results  Component Value Date/Time   HGBA1C 7.5 (H) 10/16/2019 02:24 PM   HGBA1C 7.9 (H) 05/05/2019 10:12 AM   HGBA1C 7.8 10/06/2018 12:00 AM   MICROALBUR 1.4 05/05/2019 10:12 AM   MICROALBUR 204 05/18/2018 12:00 AM   MICROALBUR 76.1 05/18/2018 12:00 AM   MICROALBUR Neg 01/25/2018 11:59 AM    Last diabetic Eye exam:  Lab Results  Component Value Date/Time   HMDIABEYEEXA Retinopathy (A) 08/02/2019 12:00 AM    Last diabetic Foot exam: No results found for: HMDIABFOOTEX   Checking BG: {CHL HP Blood Glucose Monitoring [phone]}  Recent FBG Readings: *** Recent pre-meal BG readings: *** Recent 2hr PP BG readings:  *** Recent HS BG readings: ***  Patient has failed these meds in past: *** Patient is currently {CHL Controlled/Uncontrolled:[phone]} on the following medications: . Humalog 7-8 units TID  . Lantus 25-30 units daily  . Metformin 1000 mg twice daily  . Victoza 1.8 mg daily   We discussed: {CHL HP Upstream Pharmacy discussion:[phone]}  Plan  Continue {CHL HP Upstream Pharmacy Plans:[phone]}  Asthma   Last spirometry score: ***  Gold Grade: {CHL HP Upstream Pharm COPD Gold LFYBO:[id number]} Current COPD Classification:  {CHL HP Upstream Pharm COPD Classification:[phone]}  Eosinophil count:   Lab Results  Component Value Date/Time   EOSPCT 6 02/03/2019 11:16 AM  %                               Eos (Absolute):  Lab Results  Component Value Date/Time   EOSABS 0.3 02/03/2019 11:16 AM   EOSABS 0.3 11/25/2017 02:32 PM    Tobacco Status:  Social History   Tobacco Use   Smoking Status Never Smoker  Smokeless Tobacco Never Used  Tobacco Comment   smoking cessation materials not required    Patient has failed these meds in past: *** Patient is currently {CHL Controlled/Uncontrolled:[phone]} on the following medications:  . Albuterol 108 mcg/act 2 puff q6hr PRN . Albuterol 0.083% Neb Soln.  . Benzonatat 100 mg 2 caps TID PRN  . Montelukast  10 mg QHS  . Trelegy 1 puff daily  .     Using maintenance inhaler regularly? {yes/no:20286} Frequency of rescue inhaler use:  {CHL HP Upstream Pharm Inhaler JKKX:[id number]}  We discussed:  {CHL HP Upstream Pharmacy discussion:[phone]}  Plan  Continue {CHL HP Upstream Pharmacy [phone]}  Allergic Rhinitis    Patient has failed these meds in past: *** Patient is currently {CHL Controlled/Uncontrolled:[phone]} on the following medications:  . Levocetirizine 5 mg QHS  . Loratadine 10 mg daily   We discussed:  ***  Plan  Continue {CHL HP Upstream Pharmacy [phone]}  Hypothyroidism???   Lab Results  Component Value Date/Time   TSH 3.00 10/16/2019 02:24 PM   TSH 4.130 11/25/2017 02:32 PM    Patient has failed these meds in past: *** Patient is currently {CHL Controlled/Uncontrolled:[phone]} on the following medications:  . Levothyroxine 25 mcg daily   We discussed:  {CHL HP Upstream Pharmacy discussion:[phone]}  Plan  Continue {CHL HP Upstream Pharmacy [phone]}  GERD   Patient {Actions; denies-reports:120008::"denies"} {gerd assoc sx:31969:o:"dysphagia","heartburn","nausea"}. Expresses understanding to avoid triggers such as {causes; exacerbators GERD:13199}.  Currently {CHL Controlled/Uncontrolled:[phone]} on: . Dexlansoprazole 60 mg daily   Plan   {rxplan:23810::"Continue current medication."}  BPH   PSA  Date Value Ref Range Status  05/05/2019 0.7 < OR = 4.0 ng/mL Final    Comment:    The total PSA value from this assay system is   standardized against the WHO standard. The test  result will be approximately 20% lower when compared  to the equimolar-standardized total PSA (Beckman  Coulter). Comparison of serial PSA results should be  interpreted with this fact in mind. . This test was performed using the Siemens  chemiluminescent method. Values obtained from  different assay methods cannot be used interchangeably. PSA levels, regardless of value, should not be interpreted as absolute evidence of the presence or absence of disease.      Patient has failed these meds in past: *** Patient is currently {CHL Controlled/Uncontrolled:[phone]} on the following medications:  . Tamsuolsin 0.4 mg 2 caps daily   We discussed:  ***  Plan  Continue {CHL HP Upstream Pharmacy [phone]}  Chronic Pain   Patient has failed these meds in past: *** Patient is currently {CHL Controlled/Uncontrolled:[phone]} on the following medications:  . Duloxetine 60 mg daily  . Gabapentin 300 mg TID  . Gabapentin 800 mg QHS . Oxycodone 5 mg 2 tabs q6hr PRN We discussed:  ***  Plan  Continue {CHL HP Upstream Pharmacy [phone]}   Misc / OTC   Patient has failed these meds in past: *** Patient is currently {CHL Controlled/Uncontrolled:[phone]} on the following medications:  . Betamethasone 0.1% ointment  . Cyclobenzaprine 10 mg BID PRN  . Multivitamin  . Naloxone  . Nitroglycerin  . Pyridostigmine 60 mg TID   We discussed:  ***  Plan  Continue {CHL HP Upstream Pharmacy IRCVE:[id number]}  Vaccines   Reviewed and discussed patient's vaccination history.    Immunization History  Administered Date(s) Administered  . Influenza Inj Mdck Quad Pf 06/14/2019  . Influenza,inj,Quad PF,6+ Mos 06/16/2017, 06/10/2018, 07/23/2020  . PFIZER SARS-COV-2 Vaccination 12/22/2019, 01/16/2020, 08/05/2020  . Pneumococcal Conjugate-13 07/21/2019  . Pneumococcal Polysaccharide-23 01/25/2018  . Td 04/04/2013     Plan  Recommended patient receive *** vaccine in *** office.   Medication Management   Patient's preferred pharmacy is:  [id number] - [address], [place] [address]  [place] [address] Phone: [phone] Fax: [phone]  Uses pill box? {Yes or If no, why not?:20788} Pt endorses ***% compliance  We discussed: {Pharmacy options:24294}  Plan  {US Pharmacy KNZU:36725}    Follow up: *** month phone visit  ***

## 2020-09-04 ENCOUNTER — Telehealth: Payer: Medicare Other

## 2020-09-04 ENCOUNTER — Telehealth: Payer: Self-pay | Admitting: Cardiovascular Disease

## 2020-09-04 ENCOUNTER — Other Ambulatory Visit: Payer: Self-pay | Admitting: Anesthesiology

## 2020-09-04 MED ORDER — EZETIMIBE 10 MG PO TABS: 10.0000 mg | ORAL_TABLET | Freq: Every day | ORAL | 2 refills | Status: DC

## 2020-09-04 NOTE — Telephone Encounter (Signed)
Spoke wit the patient. Per the patients Oct 2021 visit with Laurann Montana, NP  HLD - Lipid panel 10/2019 with LDL 34. Continue Rosuvastatin 5 mg daily and Zetia 10 mg daily.  Pt verbalized understanding and  sts that he will need a refill of the medication. Adv the patient that I will send in the refill as requested. Pt verbalized understanding and voiced appreciation for the call.

## 2020-09-04 NOTE — Telephone Encounter (Signed)
Patient calling in to clarify if he is supposed to be taking zetia.

## 2020-09-11 ENCOUNTER — Encounter: Payer: Self-pay | Admitting: Anesthesiology

## 2020-09-11 ENCOUNTER — Ambulatory Visit (HOSPITAL_BASED_OUTPATIENT_CLINIC_OR_DEPARTMENT_OTHER): Payer: Medicare Other | Admitting: Anesthesiology

## 2020-09-11 ENCOUNTER — Ambulatory Visit
Admission: RE | Admit: 2020-09-11 | Discharge: 2020-09-11 | Disposition: A | Discharge status: Home / Self Care | Payer: Medicare Other | Source: Ambulatory Visit | Attending: Anesthesiology | Admitting: Anesthesiology

## 2020-09-11 ENCOUNTER — Other Ambulatory Visit: Payer: Self-pay

## 2020-09-11 ENCOUNTER — Other Ambulatory Visit: Payer: Self-pay | Admitting: Anesthesiology

## 2020-09-11 VITALS — BP 129/70 | HR 81 | Resp 17 | Ht 67.0 in | Wt 250.0 lb

## 2020-09-11 DIAGNOSIS — R52 Pain, unspecified: Secondary | ICD-10-CM | POA: Insufficient documentation

## 2020-09-11 DIAGNOSIS — M542 Cervicalgia: Secondary | ICD-10-CM | POA: Diagnosis not present

## 2020-09-11 DIAGNOSIS — F1190 Opioid use, unspecified, uncomplicated: Secondary | ICD-10-CM | POA: Insufficient documentation

## 2020-09-11 DIAGNOSIS — M4716 Other spondylosis with myelopathy, lumbar region: Secondary | ICD-10-CM | POA: Diagnosis not present

## 2020-09-11 DIAGNOSIS — G894 Chronic pain syndrome: Secondary | ICD-10-CM | POA: Insufficient documentation

## 2020-09-11 DIAGNOSIS — R29898 Other symptoms and signs involving the musculoskeletal system: Secondary | ICD-10-CM | POA: Insufficient documentation

## 2020-09-11 DIAGNOSIS — M5136 Other intervertebral disc degeneration, lumbar region: Secondary | ICD-10-CM

## 2020-09-11 DIAGNOSIS — M5432 Sciatica, left side: Secondary | ICD-10-CM | POA: Insufficient documentation

## 2020-09-11 DIAGNOSIS — M51369 Other intervertebral disc degeneration, lumbar region without mention of lumbar back pain or lower extremity pain: Secondary | ICD-10-CM

## 2020-09-11 MED ORDER — ROPIVACAINE HCL 2 MG/ML IJ SOLN
10.0000 mL | Freq: Once | INTRAMUSCULAR | Status: AC
  Administered 2020-09-11: 10 mL via EPIDURAL

## 2020-09-11 MED ORDER — TRIAMCINOLONE ACETONIDE 40 MG/ML IJ SUSP
40.0000 mg | Freq: Once | INTRAMUSCULAR | Status: AC
  Administered 2020-09-11: 40 mg

## 2020-09-11 MED ORDER — OXYCODONE HCL 10 MG PO TABS: 10.0000 mg | ORAL_TABLET | Freq: Four times a day (QID) | ORAL | 0 refills | Status: DC

## 2020-09-11 MED ORDER — LIDOCAINE HCL (PF) 1 % IJ SOLN
INTRAMUSCULAR | Status: AC
  Filled 2020-09-11: qty 5

## 2020-09-11 MED ORDER — OXYCODONE HCL 10 MG PO TABS
10.0000 mg | ORAL_TABLET | Freq: Four times a day (QID) | ORAL | 0 refills | Status: AC
Start: 2020-09-11 — End: 2020-10-11

## 2020-09-11 MED ORDER — LIDOCAINE HCL (PF) 1 % IJ SOLN
5.0000 mL | Freq: Once | INTRAMUSCULAR | Status: AC
  Administered 2020-09-11: 5 mL via SUBCUTANEOUS

## 2020-09-11 MED ORDER — SODIUM CHLORIDE (PF) 0.9 % IJ SOLN
INTRAMUSCULAR | Status: AC
  Filled 2020-09-11: qty 10

## 2020-09-11 MED ORDER — ROPIVACAINE HCL 2 MG/ML IJ SOLN
INTRAMUSCULAR | Status: AC
  Filled 2020-09-11: qty 10

## 2020-09-11 MED ORDER — IOHEXOL 180 MG/ML  SOLN
10.0000 mL | Freq: Once | INTRAMUSCULAR | Status: AC | PRN
  Administered 2020-09-11: 20 mL via EPIDURAL

## 2020-09-11 MED ORDER — TRIAMCINOLONE ACETONIDE 40 MG/ML IJ SUSP
INTRAMUSCULAR | Status: AC
  Filled 2020-09-11: qty 1

## 2020-09-11 MED ORDER — CYCLOBENZAPRINE HCL 10 MG PO TABS
10.0000 mg | ORAL_TABLET | Freq: Two times a day (BID) | ORAL | 3 refills | Status: DC | PRN
Start: 2020-09-11 — End: 2020-10-11

## 2020-09-11 MED ORDER — SODIUM CHLORIDE 0.9% FLUSH
10.0000 mL | Freq: Once | INTRAVENOUS | Status: AC
  Administered 2020-09-11: 10 mL

## 2020-09-11 NOTE — Progress Notes (Signed)
Subjective:  Patient ID: [name], male    DOB: 1961-04-27  Age: 59 y.o. MRN: [id number]  CC: Back Pain (lower left)   Procedure: L5-S1 epidural steroid under fluoroscopic guidance with no sedation  HPI [name] presents for reevaluation.  He is having more left-sided sciatica radiating into the left buttock and left posterior calf.  This is comparable to what he has experienced in the past.  He generally gets 70 to 80% of pain relief with epidural steroids lasting about 2 to 3 months.  His last injection was back in May.  No new weakness is reported though he does have chronic transient left leg weakness.  Bowel and bladder function of been stable.  The quality of the pain is been stable as well.  He is taking his medications as prescribed and the oxycodone tablets seem to work well for him with no side effects.  He reports no diverting or illicit use.  Otherwise he is in his usual state of health.  Unfortunately conservative therapy has been ineffective for him.  Outpatient Medications Prior to Visit  Medication Sig Dispense Refill  . albuterol (PROVENTIL HFA;VENTOLIN HFA) 108 (90 Base) MCG/ACT inhaler Inhale 2 puffs into the lungs every 6 (six) hours as needed for wheezing or shortness of breath.     [name] albuterol (PROVENTIL) (2.5 MG/3ML) 0.083% nebulizer solution Take 2.5 mg by nebulization every 6 (six) hours as needed for wheezing or shortness of breath.     [name] aspirin EC 81 MG tablet Take 1 tablet (81 mg total) by mouth daily. 90 tablet 3  . benzonatate (TESSALON PERLES) 100 MG capsule Take 2 capsules (200 mg total) by mouth 3 (three) times daily as needed for cough. 20 capsule 1  . betamethasone valerate ointment (VALISONE) 0.1 % Apply topically.     . clopidogrel (PLAVIX) 75 MG tablet TAKE 1 TABLET(75 MG) BY MOUTH DAILY WITH BREAKFAST 30 tablet 2  . dexlansoprazole (DEXILANT) 60 MG capsule Take 1 capsule (60 mg total) by mouth daily. 30 capsule 11  . DULoxetine (CYMBALTA)  60 MG capsule TAKE 1 CAPSULE(60 MG) BY MOUTH DAILY 90 capsule 0  . ezetimibe (ZETIA) 10 MG tablet Take 1 tablet (10 mg total) by mouth daily. 90 tablet 2  . Fluticasone-Umeclidin-Vilant (TRELEGY ELLIPTA) 100-62.5-25 MCG/INH AEPB Inhale 1 each into the lungs daily. In place of breo 60 each 2  . furosemide (LASIX) 20 MG tablet Take 1 tablet (20 mg total) by mouth every other day. 45 tablet 3  . gabapentin (NEURONTIN) 300 MG capsule Take 1 capsule (300 mg total) by mouth 3 (three) times daily. 90 capsule 1  . gabapentin (NEURONTIN) 800 MG tablet Take 800 mg by mouth at bedtime.    . insulin lispro (HUMALOG KWIKPEN) 100 UNIT/ML KiwkPen Inject 7-8 Units into the skin 3 (three) times daily. Reported on 12/02/2015/ sliding scale 1 unit for every 8 units of carbs; and 1 unit for every 20 above 120    . Insulin Pen Needle (FIFTY50 PEN NEEDLES) 32G X 4 MM MISC Inject 1 each into the skin as needed.    [name] LANTUS SOLOSTAR 100 UNIT/ML Solostar Pen Inject 25-30 Units into the skin daily at 10 pm.   0  . levocetirizine (XYZAL) 5 MG tablet TAKE 1 TABLET(5 MG) BY MOUTH EVERY EVENING 30 tablet 3  . levothyroxine (SYNTHROID) 25 MCG tablet TAKE 1 TABLET(25 MCG) BY MOUTH DAILY BEFORE BREAKFAST 90 tablet 0  . liraglutide (VICTOZA) 18 MG/3ML  SOPN 1.8 units every morning (Patient taking differently: Inject 1.8 mg into the skin daily. )    . loratadine (CLARITIN) 10 MG tablet Take 10 mg by mouth daily.     [name] losartan (COZAAR) 25 MG tablet TAKE 1 TABLET(25 MG) BY MOUTH DAILY 30 tablet 3  . metFORMIN (GLUCOPHAGE) 1000 MG tablet Take 1,000 mg by mouth 2 (two) times daily with a meal.    . montelukast (SINGULAIR) 10 MG tablet Take 10 mg by mouth at bedtime.    . Multiple Vitamins-Minerals (MENS MULTI VITAMIN & MINERAL PO) Take 1 tablet by mouth daily.    . naloxone (NARCAN) nasal spray 4 mg/0.1 mL For excess sedation from opioids 1 kit 2  . nitroGLYCERIN (NITROSTAT) 0.3 MG SL tablet Place 1 tablet (0.3 mg total) under the  tongue every 5 (five) minutes as needed for chest pain. Maximum 3 doses. 25 tablet 3  . oxyCODONE (ROXICODONE) 5 MG immediate release tablet Take 2 tablets (10 mg total) by mouth every 6 (six) hours as needed for severe pain. 240 tablet 0  . pyridostigmine (MESTINON) 60 MG tablet Take 1 tablet by mouth 3 (three) times daily.    . rosuvastatin (CRESTOR) 5 MG tablet Take 5 mg by mouth daily.  0  . tamsulosin (FLOMAX) 0.4 MG CAPS capsule TAKE 2 CAPSULES BY MOUTH DAILY 60 capsule 2  . cyclobenzaprine (FLEXERIL) 10 MG tablet Take 10 mg by mouth 2 (two) times daily as needed.    . ezetimibe (ZETIA) 10 MG tablet Take 1 tablet (10 mg total) by mouth daily. 90 tablet 3  . metoprolol succinate (TOPROL-XL) 100 MG 24 hr tablet Take 1 tablet (100 mg total) by mouth daily. Take with or immediately following a meal. 90 tablet 3   No facility-administered medications prior to visit.    Review of Systems CNS: No confusion or sedation Cardiac: No angina or palpitations GI: No abdominal pain or constipation Constitutional: No nausea vomiting fevers or chills  Objective:  BP 129/70   Pulse 81   Resp 17   Ht $R'5\' 7"'eG$  (1.702 m)   Wt 250 lb (113.4 kg)   SpO2 95%   BMI 39.16 kg/m    BP Readings from Last 3 Encounters:  09/11/20 129/70  07/24/20 138/82  07/23/20 122/72     Wt Readings from Last 3 Encounters:  09/11/20 250 lb (113.4 kg)  07/24/20 254 lb 2 oz (115.3 kg)  07/23/20 253 lb (114.8 kg)     Physical Exam Pt is alert and oriented PERRL EOMI HEART IS RRR no murmur or rub LCTA no wheezing or rales MUSCULOSKELETAL reveals some paraspinous muscle tenderness in the lumbar region but no overt trigger points.  He does have a positive straight leg raise on the left side.  Negative on the right.  Muscle tone and bulk are at baseline  Labs  Lab Results  Component Value Date   HGBA1C 7.5 (H) 10/16/2019   HGBA1C 7.9 (H) 05/05/2019   HGBA1C 7.8 10/06/2018   Lab Results  Component Value Date    MICROALBUR 1.4 05/05/2019   LDLCALC 34 10/16/2019   CREATININE 1.08 07/18/2020    -------------------------------------------------------------------------------------------------------------------- Lab Results  Component Value Date   WBC 5.5 07/18/2020   HGB 9.7 (L) 07/18/2020   HCT 31.8 (L) 07/18/2020   PLT 239 07/18/2020   GLUCOSE 181 (H) 07/18/2020   CHOL 86 10/16/2019   TRIG 126 10/16/2019   HDL 31 (L) 10/16/2019   LDLCALC 34 10/16/2019  ALT 18 10/16/2019   AST 17 10/16/2019   NA 138 07/18/2020   K 4.3 07/18/2020   CL 102 07/18/2020   CREATININE 1.08 07/18/2020   BUN 20 07/18/2020   CO2 26 07/18/2020   TSH 3.00 10/16/2019   PSA 0.7 05/05/2019   INR 1.26 07/07/2018   HGBA1C 7.5 (H) 10/16/2019   MICROALBUR 1.4 05/05/2019    --------------------------------------------------------------------------------------------------------------------- DG PAIN CLINIC C-ARM 1-60 MIN NO REPORT  Result Date: 09/11/2020 Fluoro was used, but no Radiologist interpretation will be provided. Please refer to "NOTES" tab for provider progress note.    Assessment & Plan:   [name] was seen today for back pain.  Diagnoses and all orders for this visit:  Left leg weakness  Lumbar spondylosis with myelopathy  DDD (degenerative disc disease), lumbar  Chronic pain syndrome  Chronic, continuous use of opioids  Sciatica, left side  Cervicalgia  Other orders -     Oxycodone HCl 10 MG TABS; Take 1 tablet (10 mg total) by mouth in the morning, at noon, in the evening, and at bedtime. -     Oxycodone HCl 10 MG TABS; Take 1 tablet (10 mg total) by mouth in the morning, at noon, in the evening, and at bedtime. -     cyclobenzaprine (FLEXERIL) 10 MG tablet; Take 1 tablet (10 mg total) by mouth 2 (two) times daily as needed. -     triamcinolone acetonide (KENALOG-40) injection 40 mg -     sodium chloride flush (NS) 0.9 % injection 10 mL -     ropivacaine (PF) 2 mg/mL (0.2%) (NAROPIN)  injection 10 mL -     lidocaine (PF) (XYLOCAINE) 1 % injection 5 mL -     iohexol (OMNIPAQUE) 180 MG/ML injection 10 mL        ----------------------------------------------------------------------------------------------------------------------  Problem List Items Addressed This Visit      Unprioritized   Left leg weakness - Primary    Other Visit Diagnoses    Lumbar spondylosis with myelopathy       Relevant Medications   Oxycodone HCl 10 MG TABS   Oxycodone HCl 10 MG TABS (Start on 10/11/2020)   cyclobenzaprine (FLEXERIL) 10 MG tablet   triamcinolone acetonide (KENALOG-40) injection 40 mg (Completed)   DDD (degenerative disc disease), lumbar       Relevant Medications   Oxycodone HCl 10 MG TABS   Oxycodone HCl 10 MG TABS (Start on 10/11/2020)   cyclobenzaprine (FLEXERIL) 10 MG tablet   triamcinolone acetonide (KENALOG-40) injection 40 mg (Completed)   Chronic pain syndrome       Relevant Medications   Oxycodone HCl 10 MG TABS   Oxycodone HCl 10 MG TABS (Start on 10/11/2020)   cyclobenzaprine (FLEXERIL) 10 MG tablet   triamcinolone acetonide (KENALOG-40) injection 40 mg (Completed)   ropivacaine (PF) 2 mg/mL (0.2%) (NAROPIN) injection 10 mL (Completed)   lidocaine (PF) (XYLOCAINE) 1 % injection 5 mL (Completed)   Chronic, continuous use of opioids       Sciatica, left side       Relevant Medications   cyclobenzaprine (FLEXERIL) 10 MG tablet   Cervicalgia            ----------------------------------------------------------------------------------------------------------------------  1. Left leg weakness Continue with core stretching strengthening exercises.  We talked about the risks and benefits of epidural steroid administration.  Fortunately he responded favorably to these in the past.  2. Lumbar spondylosis with myelopathy As above.  And we will proceed with a lumbar epidural steroid today.  3. DDD (degenerative disc disease), lumbar As above  4. Chronic pain  syndrome I will refill his medications.  I have reviewed the [facility] practitioner database information and he is responding favorably to chronic opioid therapy.  These will be dated for December 8 and January 7 with return to clinic in 2 months.  5. Chronic, continuous use of opioids As above  6. Sciatica, left side As above  7. Cervicalgia We have gone over some core stretching strengthening exercises as reviewed today.    ----------------------------------------------------------------------------------------------------------------------  I have changed Kris Hartmann. Bodnar "[name]"'s cyclobenzaprine. I am also having him start on Oxycodone HCl and Oxycodone HCl. Additionally, I am having him maintain his insulin lispro, loratadine, albuterol, gabapentin, metFORMIN, albuterol, Lantus SoloStar, naloxone, rosuvastatin, Insulin Pen Needle, aspirin EC, liraglutide, nitroGLYCERIN, gabapentin, montelukast, ezetimibe, pyridostigmine, betamethasone valerate ointment, Multiple Vitamins-Minerals (MENS MULTI VITAMIN & MINERAL PO), metoprolol succinate, Dexilant, levocetirizine, Trelegy Ellipta, tamsulosin, oxyCODONE, DULoxetine, furosemide, benzonatate, levothyroxine, clopidogrel, losartan, and ezetimibe. We administered triamcinolone acetonide, sodium chloride flush, ropivacaine (PF) 2 mg/mL (0.2%), lidocaine (PF), and iohexol.   Meds ordered this encounter  Medications  . Oxycodone HCl 10 MG TABS    Sig: Take 1 tablet (10 mg total) by mouth in the morning, at noon, in the evening, and at bedtime.    Dispense:  120 tablet    Refill:  0  . Oxycodone HCl 10 MG TABS    Sig: Take 1 tablet (10 mg total) by mouth in the morning, at noon, in the evening, and at bedtime.    Dispense:  120 tablet    Refill:  0  . cyclobenzaprine (FLEXERIL) 10 MG tablet    Sig: Take 1 tablet (10 mg total) by mouth 2 (two) times daily as needed.    Dispense:  60 tablet    Refill:  3  . triamcinolone acetonide  (KENALOG-40) injection 40 mg  . sodium chloride flush (NS) 0.9 % injection 10 mL  . ropivacaine (PF) 2 mg/mL (0.2%) (NAROPIN) injection 10 mL  . lidocaine (PF) (XYLOCAINE) 1 % injection 5 mL  . iohexol (OMNIPAQUE) 180 MG/ML injection 10 mL   Patient's Medications  New Prescriptions   OXYCODONE HCL 10 MG TABS    Take 1 tablet (10 mg total) by mouth in the morning, at noon, in the evening, and at bedtime.   OXYCODONE HCL 10 MG TABS    Take 1 tablet (10 mg total) by mouth in the morning, at noon, in the evening, and at bedtime.  Previous Medications   ALBUTEROL (PROVENTIL HFA;VENTOLIN HFA) 108 (90 BASE) MCG/ACT INHALER    Inhale 2 puffs into the lungs every 6 (six) hours as needed for wheezing or shortness of breath.    ALBUTEROL (PROVENTIL) (2.5 MG/3ML) 0.083% NEBULIZER SOLUTION    Take 2.5 mg by nebulization every 6 (six) hours as needed for wheezing or shortness of breath.    ASPIRIN EC 81 MG TABLET    Take 1 tablet (81 mg total) by mouth daily.   BENZONATATE (TESSALON PERLES) 100 MG CAPSULE    Take 2 capsules (200 mg total) by mouth 3 (three) times daily as needed for cough.   BETAMETHASONE VALERATE OINTMENT (VALISONE) 0.1 %    Apply topically.    CLOPIDOGREL (PLAVIX) 75 MG TABLET    TAKE 1 TABLET(75 MG) BY MOUTH DAILY WITH BREAKFAST   DEXLANSOPRAZOLE (DEXILANT) 60 MG CAPSULE    Take 1 capsule (60 mg total) by mouth daily.   DULOXETINE (CYMBALTA) 60  MG CAPSULE    TAKE 1 CAPSULE(60 MG) BY MOUTH DAILY   EZETIMIBE (ZETIA) 10 MG TABLET    Take 1 tablet (10 mg total) by mouth daily.   EZETIMIBE (ZETIA) 10 MG TABLET    Take 1 tablet (10 mg total) by mouth daily.   FLUTICASONE-UMECLIDIN-VILANT (TRELEGY ELLIPTA) 100-62.5-25 MCG/INH AEPB    Inhale 1 each into the lungs daily. In place of breo   FUROSEMIDE (LASIX) 20 MG TABLET    Take 1 tablet (20 mg total) by mouth every other day.   GABAPENTIN (NEURONTIN) 300 MG CAPSULE    Take 1 capsule (300 mg total) by mouth 3 (three) times daily.   GABAPENTIN  (NEURONTIN) 800 MG TABLET    Take 800 mg by mouth at bedtime.   INSULIN LISPRO (HUMALOG KWIKPEN) 100 UNIT/ML KIWKPEN    Inject 7-8 Units into the skin 3 (three) times daily. Reported on 12/02/2015/ sliding scale 1 unit for every 8 units of carbs; and 1 unit for every 20 above 120   INSULIN PEN NEEDLE (FIFTY50 PEN NEEDLES) 32G X 4 MM MISC    Inject 1 each into the skin as needed.   LANTUS SOLOSTAR 100 UNIT/ML SOLOSTAR PEN    Inject 25-30 Units into the skin daily at 10 pm.    LEVOCETIRIZINE (XYZAL) 5 MG TABLET    TAKE 1 TABLET(5 MG) BY MOUTH EVERY EVENING   LEVOTHYROXINE (SYNTHROID) 25 MCG TABLET    TAKE 1 TABLET(25 MCG) BY MOUTH DAILY BEFORE BREAKFAST   LIRAGLUTIDE (VICTOZA) 18 MG/3ML SOPN    1.8 units every morning   LORATADINE (CLARITIN) 10 MG TABLET    Take 10 mg by mouth daily.    LOSARTAN (COZAAR) 25 MG TABLET    TAKE 1 TABLET(25 MG) BY MOUTH DAILY   METFORMIN (GLUCOPHAGE) 1000 MG TABLET    Take 1,000 mg by mouth 2 (two) times daily with a meal.   METOPROLOL SUCCINATE (TOPROL-XL) 100 MG 24 HR TABLET    Take 1 tablet (100 mg total) by mouth daily. Take with or immediately following a meal.   MONTELUKAST (SINGULAIR) 10 MG TABLET    Take 10 mg by mouth at bedtime.   MULTIPLE VITAMINS-MINERALS (MENS MULTI VITAMIN & MINERAL PO)    Take 1 tablet by mouth daily.   NALOXONE (NARCAN) NASAL SPRAY 4 MG/0.1 ML    For excess sedation from opioids   NITROGLYCERIN (NITROSTAT) 0.3 MG SL TABLET    Place 1 tablet (0.3 mg total) under the tongue every 5 (five) minutes as needed for chest pain. Maximum 3 doses.   OXYCODONE (ROXICODONE) 5 MG IMMEDIATE RELEASE TABLET    Take 2 tablets (10 mg total) by mouth every 6 (six) hours as needed for severe pain.   PYRIDOSTIGMINE (MESTINON) 60 MG TABLET    Take 1 tablet by mouth 3 (three) times daily.   ROSUVASTATIN (CRESTOR) 5 MG TABLET    Take 5 mg by mouth daily.   TAMSULOSIN (FLOMAX) 0.4 MG CAPS CAPSULE    TAKE 2 CAPSULES BY MOUTH DAILY  Modified Medications   Modified  Medication Previous Medication   CYCLOBENZAPRINE (FLEXERIL) 10 MG TABLET cyclobenzaprine (FLEXERIL) 10 MG tablet      Take 1 tablet (10 mg total) by mouth 2 (two) times daily as needed.    Take 10 mg by mouth 2 (two) times daily as needed.  Discontinued Medications   No medications on file   ----------------------------------------------------------------------------------------------------------------------  Follow-up: Return in about 2 months (around 11/12/2020)  for med refill, evaluation.   Procedure: L5-S1 LESI with fluoroscopic guidance and no moderate sedation  NOTE: The risks, benefits, and expectations of the procedure have been discussed and explained to the patient who was understanding and in agreement with suggested treatment plan. No guarantees were made.  DESCRIPTION OF PROCEDURE: Lumbar epidural steroid injection with no IV Versed, EKG, blood pressure, pulse, and pulse oximetry monitoring. The procedure was performed with the patient in the prone position under fluoroscopic guidance.  Sterile prep x3 was initiated and I then injected subcutaneous lidocaine to the overlying L5-S1 site after its fluoroscopic identifictation.  Using strict aseptic technique, I then advanced an 18-gauge Tuohy epidural needle in the midline using interlaminar approach via loss-of-resistance to saline technique. There was negative aspiration for heme or  CSF.  I then confirmed position with both AP and Lateral fluoroscan.  2 cc of contrast dye were injected and a  total of 5 mL of Preservative-Free normal saline mixed with 40 mg of Kenalog and 1cc Ropicaine 0.2 percent were injected incrementally via the  epidurally placed needle. The needle was removed. The patient tolerated the injection well and was convalesced and discharged to home in stable condition. Should the patient have any post procedure difficulty they have been instructed on how to contact us for assistance.    Molli Barrows, MD

## 2020-09-11 NOTE — Progress Notes (Signed)
Nursing Pain Medication Assessment:  Safety precautions to be maintained throughout the outpatient stay will include: orient to surroundings, keep bed in low position, maintain call bell within reach at all times, provide assistance with transfer out of bed and ambulation.  Medication Inspection Compliance: Pill count conducted under aseptic conditions, in front of the patient. Neither the pills nor the bottle was removed from the patient's sight at any time. Once count was completed pills were immediately returned to the patient in their original bottle.  Medication: Oxycodone IR Pill/Patch Count: 0 of 240 pills remain Pill/Patch Appearance: Markings consistent with prescribed medication Bottle Appearance: Standard pharmacy container. Clearly labeled. Filled Date: 11 /8 / 2021 Last Medication intake:  Today

## 2020-09-11 NOTE — Patient Instructions (Signed)

## 2020-09-12 ENCOUNTER — Telehealth: Payer: Self-pay

## 2020-09-12 NOTE — Telephone Encounter (Signed)
Post procedure phone call.  Patient states he is doing good.  

## 2020-09-16 ENCOUNTER — Other Ambulatory Visit: Payer: Self-pay

## 2020-09-16 ENCOUNTER — Encounter: Payer: Self-pay | Admitting: Family Medicine

## 2020-09-16 ENCOUNTER — Ambulatory Visit (INDEPENDENT_AMBULATORY_CARE_PROVIDER_SITE_OTHER): Payer: Medicare Other | Admitting: Family Medicine

## 2020-09-16 VITALS — BP 136/84 | HR 89 | Temp 97.9°F | Resp 16 | Ht 67.0 in | Wt 247.8 lb

## 2020-09-16 DIAGNOSIS — G7000 Myasthenia gravis without (acute) exacerbation: Secondary | ICD-10-CM

## 2020-09-16 DIAGNOSIS — I5022 Chronic systolic (congestive) heart failure: Secondary | ICD-10-CM

## 2020-09-16 DIAGNOSIS — Z794 Long term (current) use of insulin: Secondary | ICD-10-CM

## 2020-09-16 DIAGNOSIS — J4541 Moderate persistent asthma with (acute) exacerbation: Secondary | ICD-10-CM

## 2020-09-16 DIAGNOSIS — E1142 Type 2 diabetes mellitus with diabetic polyneuropathy: Secondary | ICD-10-CM | POA: Diagnosis not present

## 2020-09-16 DIAGNOSIS — I200 Unstable angina: Secondary | ICD-10-CM

## 2020-09-16 DIAGNOSIS — E063 Autoimmune thyroiditis: Secondary | ICD-10-CM

## 2020-09-16 DIAGNOSIS — I700 Atherosclerosis of aorta: Secondary | ICD-10-CM

## 2020-09-16 LAB — POCT GLYCOSYLATED HEMOGLOBIN (HGB A1C): Hemoglobin A1C: 8.4 % — AB (ref 4.0–5.6)

## 2020-09-16 NOTE — Progress Notes (Signed)
Name: [name]   MRN: [id number]    DOB: 10/18/1960   Date:09/16/2020       Progress Note  Subjective  Chief Complaint  Follow up   HPI   Right knee pain:  He is doing better since seen by PT and wearing a knee brace, still uses a cane for balance and knee and back pain   Asthma : he is having sob and is seeing Dr. Raul Del, using Trelegy now and seems to be helping more than Anoro. He was also given low dose prednisone 5 mg daily for a period of time, but off medication for the past couple of months because he could not control his glucose. He has a lot of productive cough and cough at night , no wheezing. He has follow up with Dr. Raul Del   DMII:  he is under the care of Dr. Honor Junes , A1C today was 8.4 %, he states had two courses of steroids orally and also one steroid injection and his glucose goes up. He states he has been checking glucose with Free Style Libre, glucose in am's 72-110, however over the past week going up to 170's fasting. He has associated HTN, dyslipidemia, neuropathy.  He is on statin therapy, ARB and also gabapentin.   Hypothyroidism: he is currently on 25 mcg of levothyroxine, denies constipation, chronic dry skin this time of the year. He has stable dysphagia - going on for 30 years, he chews his food well.   Low back pain/ inflammatory spondylopathy/Cervical radiculitis : under the care of Dr. [name], he had a recent infection on his lumbar spine.   Morbid obesity: he has BMI above 35 with co-morbidities such as DM, dyslipidemia, OSA, CHF. Discussed avoiding surgery   Myasthenia Gravis: seeing Dr. Westley Foots, and per patient he was supposed to stopped Mestinon but the note states continue therapy. He will contact [facility] to find out what to do. He still has ptosis but states seems better than when taking medication  Atherosclerosis of aorta and CHF: he states sleeps with the head of the bed elevated because of his wife, not sure if he has orthopnea, he has SOB  with activity that is likely multifactorial, occasionally has lower extremity edema. He is being treated for angina but no recent episodes. Taking statin therapy, ARB, lasix, metoprolol, statin and zetia.   Patient Active Problem List   Diagnosis Date Noted  . Lung involvement associated with another disorder ([place]) 05/17/2020  . Left leg weakness 12/01/2019  . Left arm weakness 10/04/2019  . Lower urinary tract symptoms 09/15/2019  . Incomplete bladder emptying 09/15/2019  . Carpal tunnel syndrome on both sides 07/12/2019  . Weakness generalized 06/21/2019  . Idiopathic peripheral neuropathy 04/21/2019  . Sensory ataxia 03/09/2019  . Colitis 07/08/2018  . OSA on CPAP 07/08/2018  . Congestive heart failure (CHF) ([place]) 05/05/2018  . Unstable angina ([place]) 04/26/2018  . Ischemic cardiomyopathy 04/26/2018  . Chronic combined systolic and diastolic CHF (congestive heart failure) ([place]) 04/26/2018  . Effort angina ([place]) 04/25/2018  . 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[place]) 01/25/2018  . Hyperlipidemia due to type 2 diabetes mellitus ([place]) 08/12/2017  . Vitamin D deficiency, unspecified 08/12/2017  . Ptosis of left eyelid 01/08/2017  . Dermatitis 12/24/2016  . Difficulty walking 04/27/2016  . Foot cramps 04/27/2016  . Morbid (severe) obesity due to excess calories ([place]) 04/06/2016  . Benign neoplasm of sigmoid colon   . Benign neoplasm of descending colon   . Benign neoplasm  of transverse colon   . Coronary artery disease involving native coronary artery with angina pectoris (HCC) 10/22/2015  . Coronary artery disease 08/22/2015  . DM (diabetes mellitus), type 2, uncontrolled with complications (HCC) 08/22/2015  . Myasthenia gravis (HCC) 08/22/2015  . Chronic left-sided low back pain 08/22/2015  . Hypertension 08/22/2015  . GERD (gastroesophageal reflux disease) 08/22/2015  . Arthritis 08/22/2015  . Diabetic peripheral neuropathy (HCC) 08/22/2015  . Hyperlipidemia  08/22/2015  . Insomnia 08/22/2015  . Type 2 diabetes mellitus with diabetic polyneuropathy, with long-term current use of insulin (HCC) 08/22/2015  . Asthma 08/22/2015  . Lyme disease 06/05/1992    Past Surgical History:  Procedure Laterality Date  . BILATERAL CARPAL TUNNEL RELEASE Bilateral L in 2012 and R in 2013  . CARDIAC CATHETERIZATION     Several Caths, most recent in  March 2016.  [name] COLONOSCOPY WITH PROPOFOL N/A 01/10/2016   Procedure: COLONOSCOPY WITH PROPOFOL;  Surgeon: Midge Minium, MD;  Location: [facility] ENDOSCOPY;  Service: Endoscopy;  Laterality: N/A;  . CORONARY ANGIOPLASTY    . CORONARY STENT INTERVENTION N/A 04/25/2018   Procedure: CORONARY STENT INTERVENTION;  Surgeon: Iran Ouch, MD;  Location: [facility] INVASIVE CV LAB;  Service: Cardiovascular;  Laterality: N/A;  . ESOPHAGOGASTRODUODENOSCOPY (EGD) WITH PROPOFOL N/A 01/10/2016   Procedure: ESOPHAGOGASTRODUODENOSCOPY (EGD) WITH PROPOFOL;  Surgeon: Midge Minium, MD;  Location: [facility] ENDOSCOPY;  Service: Endoscopy;  Laterality: N/A;  . EYE SURGERY Bilateral 2012   cataract/bilateral vitrectomies  . LEFT HEART CATH AND CORONARY ANGIOGRAPHY Left 04/25/2018   Procedure: LEFT HEART CATH AND CORONARY ANGIOGRAPHY;  Surgeon: Iran Ouch, MD;  Location: [facility] INVASIVE CV LAB;  Service: Cardiovascular;  Laterality: Left;  . TEE WITHOUT CARDIOVERSION N/A 09/05/2018   Procedure: TRANSESOPHAGEAL ECHOCARDIOGRAM (TEE);  Surgeon: Iran Ouch, MD;  Location: [facility] ORS;  Service: Cardiovascular;  Laterality: N/A;  . TONSILLECTOMY AND ADENOIDECTOMY     As a child  . TUNNELED VENOUS CATHETER PLACEMENT     removed    Family History  Problem Relation Age of Onset  . Diabetes Mother   . Heart disease Mother   . Cancer Father        Prostate CA, Anal cancer   . Dementia Father   . Diabetes Brother   . Healthy Brother   . Healthy Brother     Social History   Tobacco Use  . Smoking status: Never Smoker  . Smokeless tobacco:  Never Used  . Tobacco comment: smoking cessation materials not required  Substance Use Topics  . Alcohol use: Not Currently    Alcohol/week: 0.0 standard drinks     Current Outpatient Medications:  .  albuterol (PROVENTIL HFA;VENTOLIN HFA) 108 (90 Base) MCG/ACT inhaler, Inhale 2 puffs into the lungs every 6 (six) hours as needed for wheezing or shortness of breath. , Disp: , Rfl:  .  albuterol (PROVENTIL) (2.5 MG/3ML) 0.083% nebulizer solution, Take 2.5 mg by nebulization every 6 (six) hours as needed for wheezing or shortness of breath. , Disp: , Rfl:  .  aspirin EC 81 MG tablet, Take 1 tablet (81 mg total) by mouth daily., Disp: 90 tablet, Rfl: 3 .  benzonatate (TESSALON PERLES) 100 MG capsule, Take 2 capsules (200 mg total) by mouth 3 (three) times daily as needed for cough., Disp: 20 capsule, Rfl: 1 .  betamethasone valerate ointment (VALISONE) 0.1 %, Apply topically. , Disp: , Rfl:  .  clopidogrel (PLAVIX) 75 MG tablet, TAKE 1 TABLET(75 MG) BY MOUTH DAILY WITH  BREAKFAST, Disp: 30 tablet, Rfl: 2 .  cyclobenzaprine (FLEXERIL) 10 MG tablet, Take 1 tablet (10 mg total) by mouth 2 (two) times daily as needed., Disp: 60 tablet, Rfl: 3 .  dexlansoprazole (DEXILANT) 60 MG capsule, Take 1 capsule (60 mg total) by mouth daily., Disp: 30 capsule, Rfl: 11 .  DULoxetine (CYMBALTA) 60 MG capsule, TAKE 1 CAPSULE(60 MG) BY MOUTH DAILY, Disp: 90 capsule, Rfl: 0 .  ezetimibe (ZETIA) 10 MG tablet, Take 1 tablet (10 mg total) by mouth daily., Disp: 90 tablet, Rfl: 2 .  Fluticasone-Umeclidin-Vilant (TRELEGY ELLIPTA) 100-62.5-25 MCG/INH AEPB, Inhale 1 each into the lungs daily. In place of breo, Disp: 60 each, Rfl: 2 .  furosemide (LASIX) 20 MG tablet, Take 1 tablet (20 mg total) by mouth every other day., Disp: 45 tablet, Rfl: 3 .  gabapentin (NEURONTIN) 300 MG capsule, Take 1 capsule (300 mg total) by mouth 3 (three) times daily., Disp: 90 capsule, Rfl: 1 .  gabapentin (NEURONTIN) 800 MG tablet, Take 800 mg  by mouth at bedtime., Disp: , Rfl:  .  insulin lispro (HUMALOG) 100 UNIT/ML KiwkPen, Inject 7-8 Units into the skin 3 (three) times daily. Reported on 12/02/2015/ sliding scale 1 unit for every 8 units of carbs; and 1 unit for every 20 above 120, Disp: , Rfl:  .  Insulin Pen Needle 32G X 4 MM MISC, Inject 1 each into the skin as needed., Disp: , Rfl:  .  LANTUS SOLOSTAR 100 UNIT/ML Solostar Pen, Inject 25-30 Units into the skin daily at 10 pm. , Disp: , Rfl: 0 .  levocetirizine (XYZAL) 5 MG tablet, TAKE 1 TABLET(5 MG) BY MOUTH EVERY EVENING, Disp: 30 tablet, Rfl: 3 .  levothyroxine (SYNTHROID) 25 MCG tablet, TAKE 1 TABLET(25 MCG) BY MOUTH DAILY BEFORE BREAKFAST, Disp: 90 tablet, Rfl: 0 .  liraglutide (VICTOZA) 18 MG/3ML SOPN, 1.8 units every morning (Patient taking differently: Inject 1.8 mg into the skin daily.), Disp: , Rfl:  .  loratadine (CLARITIN) 10 MG tablet, Take 10 mg by mouth daily. , Disp: , Rfl:  .  losartan (COZAAR) 25 MG tablet, TAKE 1 TABLET(25 MG) BY MOUTH DAILY, Disp: 30 tablet, Rfl: 3 .  metFORMIN (GLUCOPHAGE) 1000 MG tablet, Take 1,000 mg by mouth 2 (two) times daily with a meal., Disp: , Rfl:  .  montelukast (SINGULAIR) 10 MG tablet, Take 10 mg by mouth at bedtime., Disp: , Rfl:  .  Multiple Vitamins-Minerals (MENS MULTI VITAMIN & MINERAL PO), Take 1 tablet by mouth daily., Disp: , Rfl:  .  naloxone (NARCAN) nasal spray 4 mg/0.1 mL, For excess sedation from opioids, Disp: 1 kit, Rfl: 2 .  nitroGLYCERIN (NITROSTAT) 0.3 MG SL tablet, Place 1 tablet (0.3 mg total) under the tongue every 5 (five) minutes as needed for chest pain. Maximum 3 doses., Disp: 25 tablet, Rfl: 3 .  Oxycodone HCl 10 MG TABS, Take 1 tablet (10 mg total) by mouth in the morning, at noon, in the evening, and at bedtime., Disp: 120 tablet, Rfl: 0 .  [START ON 10/11/2020] Oxycodone HCl 10 MG TABS, Take 1 tablet (10 mg total) by mouth in the morning, at noon, in the evening, and at bedtime., Disp: 120 tablet, Rfl: 0 .   rosuvastatin (CRESTOR) 5 MG tablet, Take 5 mg by mouth daily., Disp: , Rfl: 0 .  tamsulosin (FLOMAX) 0.4 MG CAPS capsule, TAKE 2 CAPSULES BY MOUTH DAILY, Disp: 60 capsule, Rfl: 2 .  ezetimibe (ZETIA) 10 MG tablet, Take 1 tablet (  10 mg total) by mouth daily., Disp: 90 tablet, Rfl: 3 .  metoprolol succinate (TOPROL-XL) 100 MG 24 hr tablet, Take 1 tablet (100 mg total) by mouth daily. Take with or immediately following a meal., Disp: 90 tablet, Rfl: 3 .  pyridostigmine (MESTINON) 60 MG tablet, Take 1 tablet by mouth 3 (three) times daily. (Patient not taking: Reported on 09/16/2020), Disp: , Rfl:   Allergies  Allergen Reactions  . Azathioprine Other (See Comments)    Azathioprine hypersensitivity reaction  . Novolog [Insulin Aspart] Hives    I personally reviewed active problem list, medication list, allergies, family history, social history, health maintenance with the patient/caregiver today.   ROS  Constitutional: Negative for fever or weight change.  Respiratory: positive  for cough and shortness of breath.   Cardiovascular: Negative for chest pain or palpitations.  Gastrointestinal: Negative for abdominal pain, no bowel changes.  Musculoskeletal:positive for gait problem but no  joint swelling.  Skin: Negative for rash.  Neurological: Negative for dizziness or headache.  No other specific complaints in a complete review of systems (except as listed in HPI above).  Objective  Vitals:   09/16/20 1146  BP: 136/84  Pulse: 89  Resp: 16  Temp: 97.9 F (36.6 C)  TempSrc: Oral  SpO2: 99%  Weight: 247 lb 12.8 oz (112.4 kg)  Height: $Remove'5\' 7"'axFnQQZ$  (1.702 m)    Body mass index is 38.81 kg/m.  Physical Exam  Constitutional: Patient appears well-developed and well-nourished. Obese  No distress.  HEENT: head atraumatic, normocephalic, pupils equal and reactive to light, neck supple. Mild ptosis  Cardiovascular: Normal rate, regular rhythm and normal heart sounds.  No murmur heard. No BLE  edema. Pulmonary/Chest: Effort normal , positive for end inspiratory wheezing. No respiratory distress. Abdominal: Soft.  There is no tenderness. Psychiatric: Patient has a normal mood and affect. behavior is normal. Judgment and thought content normal.  Recent Results (from the past 2160 hour(s))  Basic metabolic panel     Status: Abnormal   Collection Time: 07/18/20  5:34 PM  Result Value Ref Range   Sodium 138 135 - 145 mmol/L   Potassium 4.3 3.5 - 5.1 mmol/L   Chloride 102 98 - 111 mmol/L   CO2 26 22 - 32 mmol/L   Glucose, Bld 181 (H) 70 - 99 mg/dL    Comment: Glucose reference range applies only to samples taken after fasting for at least 8 hours.   BUN 20 8 - 23 mg/dL   Creatinine, Ser 1.08 0.61 - 1.24 mg/dL   Calcium 8.8 (L) 8.9 - 10.3 mg/dL   GFR, Estimated >60 >60 mL/min   Anion gap 10 5 - 15    Comment: Performed at [facility], [address] [place], [place] [address]  CBC     Status: Abnormal   Collection Time: 07/18/20  5:34 PM  Result Value Ref Range   WBC 5.5 4.0 - 10.5 K/uL   RBC 3.76 (L) 4.22 - 5.81 MIL/uL   Hemoglobin 9.7 (L) 13.0 - 17.0 g/dL   HCT 31.8 (L) 39.0 - 52.0 %   MCV 84.6 80.0 - 100.0 fL   MCH 25.8 (L) 26.0 - 34.0 pg   MCHC 30.5 30.0 - 36.0 g/dL   RDW 14.5 11.5 - 15.5 %   Platelets 239 150 - 400 K/uL   nRBC 0.0 0.0 - 0.2 %    Comment: Performed at [facility], [address], [place], [place] [address]  Glucose, capillary  Status: Abnormal   Collection Time: 07/18/20 11:33 PM  Result Value Ref Range   Glucose-Capillary 174 (H) 70 - 99 mg/dL    Comment: Glucose reference range applies only to samples taken after fasting for at least 8 hours.  Urinalysis, Complete w Microscopic     Status: Abnormal   Collection Time: 07/19/20  2:26 AM  Result Value Ref Range   Color, Urine STRAW (A) YELLOW   APPearance CLEAR (A) CLEAR   Specific Gravity, Urine 1.009 1.005 - 1.030   pH 6.0 5.0 - 8.0   Glucose, UA 150 (A) NEGATIVE mg/dL    Hgb urine dipstick NEGATIVE NEGATIVE   Bilirubin Urine NEGATIVE NEGATIVE   Ketones, ur NEGATIVE NEGATIVE mg/dL   Protein, ur NEGATIVE NEGATIVE mg/dL   Nitrite NEGATIVE NEGATIVE   Leukocytes,Ua NEGATIVE NEGATIVE   RBC / HPF 0-5 0 - 5 RBC/hpf   WBC, UA 0-5 0 - 5 WBC/hpf   Bacteria, UA NONE SEEN NONE SEEN   Squamous Epithelial / LPF NONE SEEN 0 - 5    Comment: Performed at [facility], [address], [place], [place] [address]  Respiratory Panel by RT PCR (Flu A&B, Covid) - Nasopharyngeal Swab     Status: None   Collection Time: 07/19/20  2:26 AM   Specimen: Nasopharyngeal Swab  Result Value Ref Range   SARS Coronavirus 2 by RT PCR NEGATIVE NEGATIVE    Comment: (NOTE) SARS-CoV-2 target nucleic acids are NOT DETECTED.  The SARS-CoV-2 RNA is generally detectable in upper respiratoy specimens during the acute phase of infection. The lowest concentration of SARS-CoV-2 viral copies this assay can detect is 131 copies/mL. A negative result does not preclude SARS-Cov-2 infection and should not be used as the sole basis for treatment or other patient management decisions. A negative result may occur with  improper specimen collection/handling, submission of specimen other than nasopharyngeal swab, presence of viral mutation(s) within the areas targeted by this assay, and inadequate number of viral copies (<131 copies/mL). A negative result must be combined with clinical observations, patient history, and epidemiological information. The expected result is Negative.  Fact Sheet for Patients:  PinkCheek.be  Fact Sheet for Healthcare Providers:  GravelBags.it  This test is no t yet approved or cleared by the [place] FDA and  has been authorized for detection and/or diagnosis of SARS-CoV-2 by FDA under an Emergency Use Authorization (EUA). This EUA will remain  in effect (meaning this test can be used) for the  duration of the COVID-19 declaration under Section 564(b)(1) of the Act, 21 U.S.C. section 360bbb-3(b)(1), unless the authorization is terminated or revoked sooner.     Influenza A by PCR NEGATIVE NEGATIVE   Influenza B by PCR NEGATIVE NEGATIVE    Comment: (NOTE) The Xpert Xpress SARS-CoV-2/FLU/RSV assay is intended as an aid in  the diagnosis of influenza from Nasopharyngeal swab specimens and  should not be used as a sole basis for treatment. Nasal washings and  aspirates are unacceptable for Xpert Xpress SARS-CoV-2/FLU/RSV  testing.  Fact Sheet for Patients: PinkCheek.be  Fact Sheet for Healthcare Providers: GravelBags.it  This test is not yet approved or cleared by the [place] FDA and  has been authorized for detection and/or diagnosis of SARS-CoV-2 by  FDA under an Emergency Use Authorization (EUA). This EUA will remain  in effect (meaning this test can be used) for the duration of the  Covid-19 declaration under Section 564(b)(1) of the Act, 21  U.S.C. section 360bbb-3(b)(1), unless the  authorization is  terminated or revoked. Performed at [facility], [place],  [address]   POCT HgB A1C     Status: Abnormal   Collection Time: 09/16/20 11:47 AM  Result Value Ref Range   Hemoglobin A1C 8.4 (A) 4.0 - 5.6 %   HbA1c POC (<> result, manual entry)     HbA1c, POC (prediabetic range)     HbA1c, POC (controlled diabetic range)       PHQ2/9: Depression screen [facility] 2/9 09/16/2020 09/11/2020 07/23/2020 06/18/2020 05/17/2020  Decreased Interest 1 0 0 0 0  Down, Depressed, Hopeless 1 0 0 0 0  PHQ - 2 Score 2 0 0 0 0  Altered sleeping 1 - - - 0  Tired, decreased energy 1 - - - 0  Change in appetite 2 - - - 1  Feeling bad or failure about yourself  0 - - - 0  Trouble concentrating 1 - - - 0  Moving slowly or fidgety/restless 1 - - - 0  Suicidal thoughts 0 - - - 0  PHQ-9 Score 8 - - -  1  Difficult doing work/chores - - - - Not difficult at all  Some recent data might be hidden    phq 9 is positive - father is sick and they had    Fall Risk: Fall Risk  09/16/2020 09/11/2020 07/23/2020 06/18/2020 05/17/2020  Falls in the past year? $RemoveBe'1 1 1 1 1  'UbAPOxjpn$ Comment - - - - -  Number falls in past yr: 0 0 0 0 1  Comment - - - - -  Injury with Fall? $RemoveBe'1 1 1 'OfgHiWqTM$ 0 1  Comment - went to ED - - -  Risk Factor Category  - - - - -  Risk for fall due to : - Other (Comment) Impaired balance/gait;History of fall(s);Orthopedic patient - -  Risk for fall due to: Comment - - - - -  Follow up - Falls prevention discussed Falls prevention discussed Falls evaluation completed -     Functional Status Survey: Is the patient deaf or have difficulty hearing?: Yes (Hearing aids) Does the patient have difficulty seeing, even when wearing glasses/contacts?: No Does the patient have difficulty concentrating, remembering, or making decisions?: No Does the patient have difficulty walking or climbing stairs?: Yes Does the patient have difficulty dressing or bathing?: No Does the patient have difficulty doing errands alone such as visiting a doctor's office or shopping?: No    Assessment & Plan  1. Type 2 diabetes mellitus with diabetic polyneuropathy, with long-term current use of insulin (HCC)  - POCT HgB A1C - Comprehensive metabolic panel  2. Chronic systolic CHF (congestive heart failure) (HCC)  - Comprehensive metabolic panel  3. Moderate persistent asthmatic bronchitis with acute exacerbation   4. Morbid obesity ([place])  Discussed with the patient the risk posed by an increased BMI. Discussed importance of portion control, calorie counting and at least 150 minutes of physical activity weekly. Avoid sweet beverages and drink more water. Eat at least 6 servings of fruit and vegetables daily   5. Unstable angina [facility])  Seeing cardiologist   6. Myasthenia gravis ([place])  Advised to contact [facility],  he stopped medication  7. Atherosclerosis of aorta (HCC)  - Lipid panel  8. Hypothyroidism, acquired, autoimmune  - TSH

## 2020-09-16 NOTE — Progress Notes (Signed)
Office Visit    Patient Name: [name] Date of Encounter: 09/17/2020  Primary Care Provider:  [name], MD Primary Cardiologist:  [name], MD Electrophysiologist:  None   Chief Complaint    [name] is a 59 y.o. male with a hx of CAD, chronic systolic heart failure, DM2, HTN, HLD, myasthenia gravis, generalized pain due to reported Lyme disease and obesity presents today for weakness, hypotension  Past Medical History    Past Medical History:  Diagnosis Date  . Allergy    dust, seasonal (worse in the fall).  . 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 Followed by Pain Specialist in [place], back and neck  . Asthma    BRONCHITIS  . Cataract    First Dx in 2012  . Chronic combined systolic and diastolic congestive heart failure ([place])    a. 03/2018 Echo: EF 30-35%, ant, antlat, apical AK, Gr1 DD; b. 07/2018 Echo: EF 35-40%, anteroseptal, apical, and ant HK. Gr1 DD; c. 09/2019 TEE: EF 40-45%.  . Coronary artery disease    a. Prior Ant MI->s/p multiple stents placed in the LAD and right coronary artery ([place]); b. 2016 Cath: reportedly nonobs dzs;  c. 04/2018 Cath/PCI: LM nl, LAD 20p, patent mid stent, LCX 78m(3.25x15 [place] DES), OM1 nl, OM2 50, OM3 40 w/ patent stent, RCA 40p, 21m, 40d w/ patent stent in RPDA, RPAV 60, EF 25-35%. 2+MR; d. 02/2019 MV: Apical scar, no isch, EF 30-44%.  . Diabetes mellitus without complication ([place])    TYPE 2  . Diabetic peripheral neuropathy (HCC)    feet and hands  . FUO (fever of unknown origin) 08/03/2018  . GERD (gastroesophageal reflux disease)   . Headache    muscle tension  . Hyperlipidemia   . Hypertension    CONTROLLED ON MEDS  . Insomnia   . Ischemic cardiomyopathy    a. 03/2018 Echo: EF 30-35%, ant, antlat, apical AK, Gr1 DD, mild MR, mildly dil LA; b. 07/2018 Echo: EF 35-40%; c. 09/2018 TEE: EF 40-45%, antsept, ant HK.  [name] Knee pain, acute 05/06/2020  . Left arm weakness 10/04/2019  . Left leg weakness 12/01/2019  .  Lyme disease    Chronic  . Myasthenia gravis ([place])   . Myocardial infarction ([place]) 2010  . Seasonal allergies   . Sepsis ([place])    a.07/2018 - unknown source. TEE neg for veg 09/2019.  [name] Sleep apnea    CPAP   Past Surgical History:  Procedure Laterality Date  . BILATERAL CARPAL TUNNEL RELEASE Bilateral L in 2012 and R in 2013  . CARDIAC CATHETERIZATION     Several Caths, most recent in  March 2016.  [name] COLONOSCOPY WITH PROPOFOL N/A 01/10/2016   Procedure: COLONOSCOPY WITH PROPOFOL;  Surgeon: [name], MD;  Location: [facility];  Service: Endoscopy;  Laterality: N/A;  . CORONARY ANGIOPLASTY    . CORONARY STENT INTERVENTION N/A 04/25/2018   Procedure: CORONARY STENT INTERVENTION;  Surgeon: [name], MD;  Location: [facility];  Service: Cardiovascular;  Laterality: N/A;  . ESOPHAGOGASTRODUODENOSCOPY (EGD) WITH PROPOFOL N/A 01/10/2016   Procedure: ESOPHAGOGASTRODUODENOSCOPY (EGD) WITH PROPOFOL;  Surgeon: [name], MD;  Location: [facility];  Service: Endoscopy;  Laterality: N/A;  . EYE SURGERY Bilateral 2012   cataract/bilateral vitrectomies  . LEFT HEART CATH AND CORONARY ANGIOGRAPHY Left 04/25/2018   Procedure: LEFT HEART CATH AND CORONARY ANGIOGRAPHY;  Surgeon: [name], MD;  Location: [facility];  Service: Cardiovascular;  Laterality: Left;  . TEE WITHOUT CARDIOVERSION N/A 09/05/2018   Procedure: TRANSESOPHAGEAL ECHOCARDIOGRAM (TEE);  Surgeon: [name], MD;  Location: [facility];  Service: Cardiovascular;  Laterality: N/A;  . TONSILLECTOMY AND ADENOIDECTOMY     As a child  . TUNNELED VENOUS CATHETER PLACEMENT     removed    Allergies  Allergies  Allergen Reactions  . Azathioprine Other (See Comments)    Azathioprine hypersensitivity reaction  . Novolog [Insulin Aspart] Hives    History of Present Illness    [name] is a 60 y.o. male with a hx of CAD, chronic systolic heart failure, DM2, HTN, HLD, myasthenia  gravis, generalized pain due to reported Lyme disease and obesity last seen 07/24/20.   Extensive cardiac history with total of 10 stents (in LAD and RCA) starting in 2009 after presenting with MI. Worsening heart failure noted in June of 2019. Echo with LVEF 30%, akinesis of anterior, anterolateral, apical myocardium, mild MR. Cardiac cath 04/2018 with patent stent in LAD, OMs, and RCA. New LCx stenosis which was stented. EF 25-30% and LVEDP severely elevated. Entresto and Lasix were added to medication management. TEE 07/2018 in setting of sepsis with no evidence of endocarditis and EF 40-45%.   [name] was stopped due to hypotension and dizziness. Tolerated Losartan well. Due to persistent tachycardia his Carvedilol was transitioned to Metoprolol. When seen 01/2020 his metoprolol tartrate was changed to Toprol 100mg  daily. He was recommended for lower extremity arterial doppler to assess for PAD which were normal.   Seen in ED 07/19/20 after syncopal episode after coughing causing him to strike his head on edge of bathtub.  Covid swab unremarkable, urinalysis unremarkable with exception of glucose of 150.  CXR no acute findings.  CT cervical spine no acute fracture or static subluxation of cervical spine  Seen in follow-up 07/24/20.  No recurrent near syncope nor syncope. Endorses occasional lightheadedness with position changes.  Orthostatic vital signs were positive with blood pressure lying 125/77 and standing 104/67.  He was recommended to reduce indicate of Diet Coke and stay well-hydrated.  He was also recommended to follow-up with pulmonology regarding concern for respiratory infection.  His syncope was thought to be due to vasovagal and ZIO monitoring was not recommended.  Presents today for follow-up.  Today he notes he has fallen 3 times since October.  The initial was in the setting of extreme coughing likely vasovagal.  Second was tripping over something.  Third was getting dizzy and  lightheaded with exertion.  Notes blood pressure at home is routinely 120/75 but this is on her wrist cuff we discussed that this may be more elevated than his blood pressure truly is.  Tells me that starting over the weekend he felt as if he was "sucking for air "and unable to do much without letting lightheaded, winded, fatigued.  He reports no chest pain, pressure, tightness.  Orthostatic vital signs today: Position  HR BP  Lying  90  108/75  Sitting  90  100/67  Standing 0 minutes  93  91/59  Standing 3 minutes  94  88/62    EKGs/Labs/Other Studies Reviewed:   The following studies were reviewed today:   EKG: No EKG is ordered today.  The ekg independently reviewed from 07/24/2020 demonstrated NSR 90 bpm with RBBB and possible old inferior infarct and anterolateral infarct. No acute ST/T wave changes. Stable compared to previous EKG.  Recent Labs: 10/16/2019: ALT 18; TSH 3.00 07/18/2020: BUN 20; Creatinine, Ser  1.08; Hemoglobin 9.7; Platelets 239; Potassium 4.3; Sodium 138  Recent Lipid Panel    Component Value Date/Time   CHOL 86 10/16/2019 1424   CHOL 113 01/17/2018 0819   TRIG 126 10/16/2019 1424   HDL 31 (L) 10/16/2019 1424   HDL 33 (L) 01/17/2018 0819   CHOLHDL 2.8 10/16/2019 1424   VLDL 26 11/29/2017 1154   LDLCALC 34 10/16/2019 1424   Home Medications   Current Meds  Medication Sig  . albuterol (PROVENTIL HFA;VENTOLIN HFA) 108 (90 Base) MCG/ACT inhaler Inhale 2 puffs into the lungs every 6 (six) hours as needed for wheezing or shortness of breath.   [name] albuterol (PROVENTIL) (2.5 MG/3ML) 0.083% nebulizer solution Take 2.5 mg by nebulization every 6 (six) hours as needed for wheezing or shortness of breath.   [name] aspirin EC 81 MG tablet Take 1 tablet (81 mg total) by mouth daily.  . benzonatate (TESSALON PERLES) 100 MG capsule Take 2 capsules (200 mg total) by mouth 3 (three) times daily as needed for cough.  . betamethasone valerate ointment (VALISONE) 0.1 % Apply  topically.   . clopidogrel (PLAVIX) 75 MG tablet TAKE 1 TABLET(75 MG) BY MOUTH DAILY WITH BREAKFAST  . cyclobenzaprine (FLEXERIL) 10 MG tablet Take 1 tablet (10 mg total) by mouth 2 (two) times daily as needed.  [name] dexlansoprazole (DEXILANT) 60 MG capsule Take 1 capsule (60 mg total) by mouth daily.  . DULoxetine (CYMBALTA) 60 MG capsule TAKE 1 CAPSULE(60 MG) BY MOUTH DAILY  . ezetimibe (ZETIA) 10 MG tablet Take 1 tablet (10 mg total) by mouth daily.  . Fluticasone-Umeclidin-Vilant (TRELEGY ELLIPTA) 100-62.5-25 MCG/INH AEPB Inhale 1 each into the lungs daily. In place of breo  . gabapentin (NEURONTIN) 300 MG capsule Take 1 capsule (300 mg total) by mouth 3 (three) times daily.  [name] gabapentin (NEURONTIN) 800 MG tablet Take 800 mg by mouth at bedtime.  . insulin lispro (HUMALOG) 100 UNIT/ML KiwkPen Inject 7-8 Units into the skin 3 (three) times daily. Reported on 12/02/2015/ sliding scale 1 unit for every 8 units of carbs; and 1 unit for every 20 above 120  . Insulin Pen Needle 32G X 4 MM MISC Inject 1 each into the skin as needed.  [name] LANTUS SOLOSTAR 100 UNIT/ML Solostar Pen Inject 25-30 Units into the skin daily at 10 pm.   . levocetirizine (XYZAL) 5 MG tablet TAKE 1 TABLET(5 MG) BY MOUTH EVERY EVENING  . levothyroxine (SYNTHROID) 25 MCG tablet TAKE 1 TABLET(25 MCG) BY MOUTH DAILY BEFORE BREAKFAST  . liraglutide (VICTOZA) 18 MG/3ML SOPN 1.8 units every morning (Patient taking differently: Inject 1.8 mg into the skin daily.)  . loratadine (CLARITIN) 10 MG tablet Take 10 mg by mouth daily.   . metFORMIN (GLUCOPHAGE) 1000 MG tablet Take 1,000 mg by mouth 2 (two) times daily with a meal.  . montelukast (SINGULAIR) 10 MG tablet Take 10 mg by mouth at bedtime.  . Multiple Vitamins-Minerals (MENS MULTI VITAMIN & MINERAL PO) Take 1 tablet by mouth daily.  . naloxone (NARCAN) nasal spray 4 mg/0.1 mL For excess sedation from opioids  . nitroGLYCERIN (NITROSTAT) 0.3 MG SL tablet Place 1 tablet (0.3 mg total)  under the tongue every 5 (five) minutes as needed for chest pain. Maximum 3 doses.  . Oxycodone HCl 10 MG TABS Take 1 tablet (10 mg total) by mouth in the morning, at noon, in the evening, and at bedtime.  Derrill Memo ON 10/11/2020] Oxycodone HCl 10 MG TABS Take 1 tablet (10 mg  total) by mouth in the morning, at noon, in the evening, and at bedtime.  . rosuvastatin (CRESTOR) 5 MG tablet Take 5 mg by mouth daily.  . tamsulosin (FLOMAX) 0.4 MG CAPS capsule TAKE 2 CAPSULES BY MOUTH DAILY  . [DISCONTINUED] furosemide (LASIX) 20 MG tablet Take 1 tablet (20 mg total) by mouth every other day.  . [DISCONTINUED] losartan (COZAAR) 25 MG tablet TAKE 1 TABLET(25 MG) BY MOUTH DAILY    Review of Systems  All other systems reviewed and are otherwise negative except as noted above.  Physical Exam   VS:  BP 108/75   Pulse 90   Ht 5\' 7"  (1.702 m)   Wt 245 lb (111.1 kg)   BMI 38.37 kg/m  , BMI Body mass index is 38.37 kg/m.  Wt Readings from Last 3 Encounters:  09/17/20 245 lb (111.1 kg)  09/16/20 247 lb 12.8 oz (112.4 kg)  09/11/20 250 lb (113.4 kg)    GEN: Well nourished, overweight, well developed, in no acute distress. HEENT: normal. Neck: Supple, no JVD, carotid bruits, or masses. Cardiac: RRR, no murmurs, rubs, or gallops. No clubbing, cyanosis, edema. Radials/PT 2+ and equal bilaterally.  Respiratory:  Respirations regular and unlabored.  Inspiratory wheeze to bilateral lower lobes. GI: Soft, nontender, nondistended. MS: No deformity or atrophy. Skin: Warm and dry, no rash. Neuro:  Strength and sensation are intact. Psych: Normal affect.  Assessment & Plan    1. Syncope / Fatigue / Orthostatic hypotension / Dizziness / Lightheadedness -ED visit 07/19/20 with syncope in the setting of coughing and vagal response.  Now with orthostatic hypotension and persistent lightheadedness. TSH, CMET, CBC today. 14 day ZIO placed today to rule out tachyarrhytmia as contributory. Echocardiogram ordered. Stop  Losartan, Change Lasix to PRN. We will continue Metoprolol but have low threshold to reduce dose.   2. Anemia -CBC today to assess recurrent anemia which could be contributory to orthostatic hypotension and volume depletion.  Denies bleeding.  3. CAD - No symptoms concerning for angina.  No indication for ischemic evaluation at this time.  GDMT includes aspirin, beta-blocker, statin.  4. 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 Euvolemic, well compensated on exam.  Weight loss of 9 pounds since clinic visit 2 months ago.  Endorses following low-salt, heart healthy diet.  Change Lasix to 20 mg as needed for weight gain 3 pounds overnight or 5 pounds in 1 week.  Previous intolerance to Entresto and carvedilol.  Discontinue losartan today, as above, due to hypotension.  Continue metoprolol. Continue low-sodium, heartily diet.  Continue regular cardiovascular exercise.  Continue less than 2 L fluid restriction.  5. HTN -now with orthostatic hypotension.  Management plan, as above.  6. HLD -lipid panel 10/2019 with LDL 34.  Continue Crestor 5 mg and Zetia 10 mg daily.  Update lipid panel, CMP today.  7. DM2 - Continue to follow with PCP and endocrinology.  8. OSA - CPAP compliance encouraged.  9. Asthma -continue to follow with Dr. Raul Del of pulmonology.  Inspiratory wheezes noted to bilateral lower lobes.   Disposition: Follow up in January as previously scheduled.   Signed, Loel Dubonnet, NP 09/17/2020, 5:00 PM [place]

## 2020-09-17 ENCOUNTER — Ambulatory Visit: Payer: Medicare Other | Admitting: Family

## 2020-09-17 ENCOUNTER — Ambulatory Visit (INDEPENDENT_AMBULATORY_CARE_PROVIDER_SITE_OTHER): Payer: Medicare Other

## 2020-09-17 ENCOUNTER — Encounter: Payer: Self-pay | Admitting: Family

## 2020-09-17 VITALS — BP 108/75 | HR 90 | Ht 67.0 in | Wt 245.0 lb

## 2020-09-17 DIAGNOSIS — E785 Hyperlipidemia, unspecified: Secondary | ICD-10-CM | POA: Diagnosis not present

## 2020-09-17 DIAGNOSIS — R55 Syncope and collapse: Secondary | ICD-10-CM | POA: Diagnosis not present

## 2020-09-17 DIAGNOSIS — D649 Anemia, unspecified: Secondary | ICD-10-CM

## 2020-09-17 DIAGNOSIS — I2510 Atherosclerotic heart disease of native coronary artery without angina pectoris: Secondary | ICD-10-CM

## 2020-09-17 DIAGNOSIS — R0600 Dyspnea, unspecified: Secondary | ICD-10-CM | POA: Diagnosis not present

## 2020-09-17 DIAGNOSIS — I5042 Chronic combined systolic (congestive) and diastolic (congestive) heart failure: Secondary | ICD-10-CM

## 2020-09-17 DIAGNOSIS — I951 Orthostatic hypotension: Secondary | ICD-10-CM | POA: Diagnosis not present

## 2020-09-17 DIAGNOSIS — I5022 Chronic systolic (congestive) heart failure: Secondary | ICD-10-CM | POA: Diagnosis not present

## 2020-09-17 DIAGNOSIS — I255 Ischemic cardiomyopathy: Secondary | ICD-10-CM

## 2020-09-17 MED ORDER — FUROSEMIDE 20 MG PO TABS: 20.0000 mg | ORAL_TABLET | Freq: Every day | ORAL | 3 refills | Status: DC | PRN

## 2020-09-17 NOTE — Patient Instructions (Addendum)
Medication Instructions:  Your physician has recommended you make the following change in your medication:   STOP Losartan  CHANGE Lasix to 20mg  once daily as needed for weight gain of 2 pounds overnight or 5 pounds in one week   *If you need a refill on your cardiac medications before your next appointment, please call your pharmacy*   Lab Work: Your physician recommends lab work today: LIPID, TSH, CMET, CBC.   If you have labs (blood work) drawn today and your tests are completely normal, you will receive your results only by: [name] MyChart Message (if you have MyChart) OR . A paper copy in the mail If you have any lab test that is abnormal or we need to change your treatment, we will call you to review the results.   Testing/Procedures: Your physician has requested that you have an echocardiogram. Echocardiography is a painless test that uses sound waves to create images of your heart. It provides your doctor with information about the size and shape of your heart and how well your heart's chambers and valves are working. This procedure takes approximately one hour. There are no restrictions for this procedure.  Your physician has recommended that you wear a Zio monitor. This monitor is a medical device that records the heart's electrical activity. Doctors most often use these monitors to diagnose arrhythmias. Arrhythmias are problems with the speed or rhythm of the heartbeat. The monitor is a small device applied to your chest. You can wear one while you do your normal daily activities. While wearing this monitor if you have any symptoms to push the button and record what you felt. Once you have worn this monitor for the period of time provider prescribed (Usually 14 days), you will return the monitor device in the postage paid box. Once it is returned they will download the data collected and provide [place] with a report which the provider will then review and we will call you with those results.  Important tips:  1. Avoid showering during the first 24 hours of wearing the monitor. 2. Avoid excessive sweating to help maximize wear time. 3. Do not submerge the device, no hot tubs, and no swimming pools. 4. Keep any lotions or oils away from the patch. 5. After 24 hours you may shower with the patch on. Take brief showers with your back facing the shower head.  6. Do not remove patch once it has been placed because that will interrupt data and decrease adhesive wear time. 7. Push the button when you have any symptoms and write down what you were feeling. 8. Once you have completed wearing your monitor, remove and place into box which has postage paid and place in your outgoing mailbox.  9. If for some reason you have misplaced your box then call our office and we can provide another box and/or mail it off for you.       Follow-Up: At [facility], you and your health needs are our priority.  As part of our continuing mission to provide you with exceptional heart care, we have created designated Provider Care Teams.  These Care Teams include your primary Cardiologist (physician) and Advanced Practice Providers (APPs -  Physician Assistants and Nurse Practitioners) who all work together to provide you with the care you need, when you need it.  We recommend signing up for the patient portal called "MyChart".  Sign up information is provided on this After Visit Summary.  MyChart is used to connect with  patients for Virtual Visits (Telemedicine).  Patients are able to view lab/test results, encounter notes, upcoming appointments, etc.  Non-urgent messages can be sent to your provider as well.   To learn more about what you can do with MyChart, go to [email or web address].    Your next appointment:   In January as previously scheduled  Other Instructions   Check your blood pressure once per day and keep a log. Bring the log and your BP cuff to your next office visit.   Orthostatic  Hypotension Blood pressure is a measurement of how strongly, or weakly, your blood is pressing against the walls of your arteries. Orthostatic hypotension is a sudden drop in blood pressure that happens when you quickly change positions, such as when you get up from sitting or lying down. Arteries are blood vessels that carry blood from your heart throughout your body. When blood pressure is too low, you may not get enough blood to your brain or to the rest of your organs. This can cause weakness, light-headedness, rapid heartbeat, and fainting. This can last for just a few seconds or for up to a few minutes. Orthostatic hypotension is usually not a serious problem. However, if it happens frequently or gets worse, it may be a sign of something more serious. What are the causes? This condition may be caused by:  Sudden changes in posture, such as standing up quickly after you have been sitting or lying down.  Blood loss.  Loss of body fluids (dehydration).  Heart problems.  Hormone (endocrine) problems.  Pregnancy.  Severe infection.  Lack of certain nutrients.  Severe allergic reactions (anaphylaxis).  Certain medicines, such as blood pressure medicine or medicines that make the body lose excess fluids (diuretics). Sometimes, this condition can be caused by not taking medicine as directed, such as taking too much of a certain medicine. What increases the risk? The following factors may make you more likely to develop this condition:  Age. Risk increases as you get older.  Conditions that affect the heart or the central nervous system.  Taking certain medicines, such as blood pressure medicine or diuretics.  Being pregnant. What are the signs or symptoms? Symptoms of this condition may include:  Weakness.  Light-headedness.  Dizziness.  Blurred vision.  Fatigue.  Rapid heartbeat.  Fainting, in severe cases. How is this diagnosed? This condition is diagnosed based  on:  Your medical history.  Your symptoms.  Your blood pressure measurement. Your health care provider will check your blood pressure when you are: ? Lying down. ? Sitting. ? Standing. A blood pressure reading is recorded as two numbers, such as "120 over 80" (or 120/80). The first ("top") number is called the systolic pressure. It is a measure of the pressure in your arteries as your heart beats. The second ("bottom") number is called the diastolic pressure. It is a measure of the pressure in your arteries when your heart relaxes between beats. Blood pressure is measured in a unit called mm Hg. Healthy blood pressure for most adults is 120/80. If your blood pressure is below 90/60, you may be diagnosed with hypotension. Other information or tests that may be used to diagnose orthostatic hypotension include:  Your other vital signs, such as your heart rate and temperature.  Blood tests.  Tilt table test. For this test, you will be safely secured to a table that moves you from a lying position to an upright position. Your heart rhythm and blood pressure will be monitored during  the test. How is this treated? This condition may be treated by:  Changing your diet. This may involve eating more salt (sodium) or drinking more water.  Taking medicines to raise your blood pressure.  Changing the dosage of certain medicines you are taking that might be lowering your blood pressure.  Wearing compression stockings. These stockings help to prevent blood clots and reduce swelling in your legs. In some cases, you may need to go to the hospital for:  Fluid replacement. This means you will receive fluids through an IV.  Blood replacement. This means you will receive donated blood through an IV (transfusion).  Treating an infection or heart problems, if this applies.  Monitoring. You may need to be monitored while medicines that you are taking wear off. Follow these instructions at home: Eating  and drinking   Drink enough fluid to keep your urine pale yellow.  Eat a healthy diet, and follow instructions from your health care provider about eating or drinking restrictions. A healthy diet includes: ? Fresh fruits and vegetables. ? Whole grains. ? Lean meats. ? Low-fat dairy products.  Eat extra salt only as directed. Do not add extra salt to your diet unless your health care provider told you to do that.  Eat frequent, small meals.  Avoid standing up suddenly after eating. Medicines  Take over-the-counter and prescription medicines only as told by your health care provider. ? Follow instructions from your health care provider about changing the dosage of your current medicines, if this applies. ? Do not stop or adjust any of your medicines on your own. General instructions   Wear compression stockings as told by your health care provider.  Get up slowly from lying down or sitting positions. This gives your blood pressure a chance to adjust.  Avoid hot showers and excessive heat as directed by your health care provider.  Return to your normal activities as told by your health care provider. Ask your health care provider what activities are safe for you.  Do not use any products that contain nicotine or tobacco, such as cigarettes, e-cigarettes, and chewing tobacco. If you need help quitting, ask your health care provider.  Keep all follow-up visits as told by your health care provider. This is important. Contact a health care provider if you:  Vomit.  Have diarrhea.  Have a fever for more than 2-3 days.  Feel more thirsty than usual.  Feel weak and tired. Get help right away if you:  Have chest pain.  Have a fast or irregular heartbeat.  Develop numbness in any part of your body.  Cannot move your arms or your legs.  Have trouble speaking.  Become sweaty or feel light-headed.  Faint.  Feel short of breath.  Have trouble staying awake.  Feel  confused. Summary  Orthostatic hypotension is a sudden drop in blood pressure that happens when you quickly change positions.  Orthostatic hypotension is usually not a serious problem.  It is diagnosed by having your blood pressure taken lying down, sitting, and then standing.  It may be treated by changing your diet or adjusting your medicines. This information is not intended to replace advice given to you by your health care provider. Make sure you discuss any questions you have with your health care provider. Document Revised: 03/17/2018 Document Reviewed: 03/17/2018 Elsevier Patient Education  [place].

## 2020-09-18 ENCOUNTER — Telehealth: Payer: Self-pay | Admitting: *Deleted

## 2020-09-18 ENCOUNTER — Encounter: Payer: Self-pay | Admitting: Family Medicine

## 2020-09-18 ENCOUNTER — Other Ambulatory Visit: Payer: Self-pay | Admitting: Family Medicine

## 2020-09-18 DIAGNOSIS — E063 Autoimmune thyroiditis: Secondary | ICD-10-CM

## 2020-09-18 LAB — LIPID PANEL
Chol/HDL Ratio: 2.5 ratio (ref 0.0–5.0)
Cholesterol, Total: 104 mg/dL (ref 100–199)
HDL: 41 mg/dL (ref >39)
LDL Chol Calc (NIH): 44 mg/dL (ref 0–99)
Triglycerides: 97 mg/dL (ref 0–149)
VLDL Cholesterol Cal: 19 mg/dL (ref 5–40)

## 2020-09-18 LAB — COMPREHENSIVE METABOLIC PANEL
ALT: 20 IU/L (ref 0–44)
AST: 17 IU/L (ref 0–40)
Albumin/Globulin Ratio: 2.2 (ref 1.2–2.2)
Albumin: 4.2 g/dL (ref 3.8–4.8)
Alkaline Phosphatase: 90 IU/L (ref 44–121)
BUN/Creatinine Ratio: 21 (ref 10–24)
BUN: 30 mg/dL — ABNORMAL HIGH (ref 8–27)
Bilirubin Total: 0.3 mg/dL (ref 0.0–1.2)
CO2: 24 mmol/L (ref 20–29)
Calcium: 9.4 mg/dL (ref 8.6–10.2)
Chloride: 103 mmol/L (ref 96–106)
Creatinine, Ser: 1.42 mg/dL — ABNORMAL HIGH (ref 0.76–1.27)
GFR calc Af Amer: 61 mL/min/{1.73_m2} (ref >59)
GFR calc non Af Amer: 53 mL/min/{1.73_m2} — ABNORMAL LOW (ref >59)
Globulin, Total: 1.9 g/dL (ref 1.5–4.5)
Glucose: 164 mg/dL — ABNORMAL HIGH (ref 65–99)
Potassium: 5.2 mmol/L (ref 3.5–5.2)
Sodium: 141 mmol/L (ref 134–144)
Total Protein: 6.1 g/dL (ref 6.0–8.5)

## 2020-09-18 LAB — CBC WITH DIFFERENTIAL/PLATELET
Basophils Absolute: 0 10*3/uL (ref 0.0–0.2)
Basos: 0 %
EOS (ABSOLUTE): 0.2 10*3/uL (ref 0.0–0.4)
Eos: 3 %
Hematocrit: 34.8 % — ABNORMAL LOW (ref 37.5–51.0)
Hemoglobin: 10.9 g/dL — ABNORMAL LOW (ref 13.0–17.7)
Immature Grans (Abs): 0.1 10*3/uL (ref 0.0–0.1)
Immature Granulocytes: 1 %
Lymphocytes Absolute: 1.3 10*3/uL (ref 0.7–3.1)
Lymphs: 15 %
MCH: 25.7 pg — ABNORMAL LOW (ref 26.6–33.0)
MCHC: 31.3 g/dL — ABNORMAL LOW (ref 31.5–35.7)
MCV: 82 fL (ref 79–97)
Monocytes Absolute: 0.8 10*3/uL (ref 0.1–0.9)
Monocytes: 9 %
Neutrophils Absolute: 6.4 10*3/uL (ref 1.4–7.0)
Neutrophils: 72 %
Platelets: 233 10*3/uL (ref 150–450)
RBC: 4.24 x10E6/uL (ref 4.14–5.80)
RDW: 14.5 % (ref 11.6–15.4)
WBC: 8.8 10*3/uL (ref 3.4–10.8)

## 2020-09-18 LAB — TSH: TSH: 4.84 u[IU]/mL — ABNORMAL HIGH (ref 0.450–4.500)

## 2020-09-18 NOTE — Telephone Encounter (Signed)
-----   Message from Loel Dubonnet, NP sent at 09/18/2020  7:44 AM EST ----- CBC shows hemoglobin improving compared to previous. Thyroid stimulating hormone mildly elevated, I have forwarded to his  PCP Dr. Ancil Boozer for her review. Cholesterol numbers are at goal! Normal liver function and electrolytes. Kidney function shows mild decline, at clinic yesterday we changed Lasix to as-needed and stopped Losartan which will help to improve this.

## 2020-09-18 NOTE — Telephone Encounter (Signed)
Spoke to pt, notified of results below. Pt verbalized understanding and has no further questions at this time.

## 2020-09-20 ENCOUNTER — Encounter: Payer: Self-pay | Admitting: Family

## 2020-09-24 DIAGNOSIS — E785 Hyperlipidemia, unspecified: Secondary | ICD-10-CM | POA: Diagnosis not present

## 2020-09-24 DIAGNOSIS — E1169 Type 2 diabetes mellitus with other specified complication: Secondary | ICD-10-CM | POA: Diagnosis not present

## 2020-09-24 DIAGNOSIS — E1165 Type 2 diabetes mellitus with hyperglycemia: Secondary | ICD-10-CM | POA: Diagnosis not present

## 2020-09-24 DIAGNOSIS — E1159 Type 2 diabetes mellitus with other circulatory complications: Secondary | ICD-10-CM | POA: Diagnosis not present

## 2020-09-24 DIAGNOSIS — Z794 Long term (current) use of insulin: Secondary | ICD-10-CM | POA: Diagnosis not present

## 2020-10-06 ENCOUNTER — Other Ambulatory Visit: Payer: Self-pay | Admitting: Family Medicine

## 2020-10-06 DIAGNOSIS — F331 Major depressive disorder, recurrent, moderate: Secondary | ICD-10-CM

## 2020-10-06 DIAGNOSIS — F411 Generalized anxiety disorder: Secondary | ICD-10-CM

## 2020-10-07 ENCOUNTER — Other Ambulatory Visit: Payer: Self-pay

## 2020-10-08 ENCOUNTER — Other Ambulatory Visit: Payer: Self-pay | Admitting: Family Medicine

## 2020-10-08 DIAGNOSIS — J302 Other seasonal allergic rhinitis: Secondary | ICD-10-CM

## 2020-10-16 DIAGNOSIS — R55 Syncope and collapse: Secondary | ICD-10-CM | POA: Diagnosis not present

## 2020-10-18 ENCOUNTER — Telehealth: Payer: Self-pay | Admitting: *Deleted

## 2020-10-18 NOTE — Chronic Care Management (AMB) (Signed)
  Care Management   Note  10/18/2020 Name: OVAL CAVAZOS MRN: [id number] DOB: 1961-09-20  TERI DILTZ is a 60 y.o. year old male who is a primary care patient of [name], MD and is actively engaged with the care management team. I reached out to [name] by phone today to assist with re-scheduling an initial visit with the Pharmacist  Follow up plan: Unsuccessful telephone outreach attempt made. A HIPAA compliant phone message was left for the patient providing contact information and requesting a return call. The care management team will reach out to the patient again over the next 7 days.  If patient returns call to provider office, please advise to call [place] [facility]

## 2020-10-21 ENCOUNTER — Other Ambulatory Visit: Payer: Medicare Other

## 2020-10-22 NOTE — Progress Notes (Deleted)
**Note Edgar-Identified via Obfuscation** Office Visit    Patient Name: [name] Date of Encounter: 10/22/2020  Primary Care Provider:  [name], MD Primary Cardiologist:  [name], MD Electrophysiologist:  None   Chief Complaint    [name] is a 60 y.o. male with a hx of CAD, chronic systolic heart failure, DM2, HTN, HLD, myasthenia gravis, generalized pain due to reported Lyme disease and obesity presents today for follow up of ZIO and echo.   Past Medical History    Past Medical History:  Diagnosis Date  . Allergy    dust, seasonal (worse in the fall).  . 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 Followed by Pain Specialist in [place], back and neck  . Asthma    BRONCHITIS  . Cataract    First Dx in 2012  . Chronic combined systolic and diastolic congestive heart failure ([name])    a. 03/2018 Echo: EF 30-35%, ant, antlat, apical AK, Gr1 DD; b. 07/2018 Echo: EF 35-40%, anteroseptal, apical, and ant HK. Gr1 DD; c. 09/2019 TEE: EF 40-45%.  . Coronary artery disease    a. Prior Ant MI->s/p multiple stents placed in the LAD and right coronary artery ([place]); b. 2016 Cath: reportedly nonobs dzs;  c. 04/2018 Cath/PCI: LM nl, LAD 20p, patent mid stent, LCX 81m(3.25x15 [place] DES), OM1 nl, OM2 50, OM3 40 w/ patent stent, RCA 40p, 15m, 40d w/ patent stent in RPDA, RPAV 60, EF 25-35%. 2+MR; d. 02/2019 MV: Apical scar, no isch, EF 30-44%.  . Diabetes mellitus without complication ([name])    TYPE 2  . Diabetic peripheral neuropathy (HCC)    feet and hands  . FUO (fever of unknown origin) 08/03/2018  . GERD (gastroesophageal reflux disease)   . Headache    muscle tension  . Hyperlipidemia   . Hypertension    CONTROLLED ON MEDS  . Insomnia   . Ischemic cardiomyopathy    a. 03/2018 Echo: EF 30-35%, ant, antlat, apical AK, Gr1 DD, mild MR, mildly dil LA; b. 07/2018 Echo: EF 35-40%; c. 09/2018 TEE: EF 40-45%, antsept, ant HK.  [name] Knee pain, acute 05/06/2020  . Left arm weakness 10/04/2019  . Left leg weakness 12/01/2019   . Lyme disease    Chronic  . Myasthenia gravis ([name])   . Myocardial infarction ([name]) 2010  . Seasonal allergies   . Sepsis ([name])    a.07/2018 - unknown source. TEE neg for veg 09/2019.  [name] Sleep apnea    CPAP   Past Surgical History:  Procedure Laterality Date  . BILATERAL CARPAL TUNNEL RELEASE Bilateral L in 2012 and R in 2013  . CARDIAC CATHETERIZATION     Several Caths, most recent in  March 2016.  [name] COLONOSCOPY WITH PROPOFOL N/A 01/10/2016   Procedure: COLONOSCOPY WITH PROPOFOL;  Surgeon: Edgar Lame, MD;  Location: [facility] END[facility];  Service: Endoscopy;  Laterality: N/A;  . CORONARY ANGIOPLASTY    . CORONARY STENT INTERVENTION N/A 04/25/2018   Procedure: CORONARY STENT INTERVENTION;  Surgeon: Edgar Hampshire, MD;  Location: [facility];  Service: Cardiovascular;  Laterality: N/A;  . ESOPHAGOGASTRODUODENOSCOPY (EGD) WITH PROPOFOL N/A 01/10/2016   Procedure: ESOPHAGOGASTRODUODENOSCOPY (EGD) WITH PROPOFOL;  Surgeon: Edgar Lame, MD;  Location: [facility] ENDOSCOPY;  Service: Endoscopy;  Laterality: N/A;  . EYE SURGERY Bilateral 2012   cataract/bilateral vitrectomies  . LEFT HEART CATH AND CORONARY ANGIOGRAPHY Left 04/25/2018   Procedure: LEFT HEART CATH AND CORONARY ANGIOGRAPHY;  Surgeon: Edgar Hampshire, MD;  Location: [facility];  Service: Cardiovascular;  Laterality: Left;  . TEE WITHOUT CARDIOVERSION N/A 09/05/2018   Procedure: TRANSESOPHAGEAL ECHOCARDIOGRAM (TEE);  Surgeon: Edgar Hampshire, MD;  Location: [facility];  Service: Cardiovascular;  Laterality: N/A;  . TONSILLECTOMY AND ADENOIDECTOMY     As a child  . TUNNELED VENOUS CATHETER PLACEMENT     removed    Allergies  Allergies  Allergen Reactions  . Azathioprine Other (See Comments)    Azathioprine hypersensitivity reaction  . Novolog [Insulin Aspart] Hives    History of Present Illness    [name] is a 60 y.o. male with a hx of CAD, chronic systolic heart failure, DM2, HTN, HLD, myasthenia  gravis, generalized pain due to reported Lyme disease and obesity last seen 09/17/20.   Extensive cardiac history with total of 10 stents (in LAD and RCA) starting in 2009 after presenting with MI. Worsening heart failure noted in June of 2019. Echo with LVEF 30%, akinesis of anterior, anterolateral, apical myocardium, mild MR. Cardiac cath 04/2018 with patent stent in LAD, OMs, and RCA. New LCx stenosis which was stented. EF 25-30% and LVEDP severely elevated. Entresto and Lasix were added to medication management. TEE 07/2018 in setting of sepsis with no evidence of endocarditis and EF 40-45%.   [name] was stopped due to hypotension and dizziness. Tolerated Losartan well. Due to persistent tachycardia his Carvedilol was transitioned to Metoprolol. When seen 01/2020 his metoprolol tartrate was changed to Toprol 100mg  daily. He was recommended for lower extremity arterial doppler to assess for PAD which were normal.   Seen in ED 07/19/20 after syncopal episode after coughing causing him to strike his head on edge of bathtub.  Covid swab unremarkable, urinalysis unremarkable with exception of glucose of 150.  CXR no acute findings.  CT cervical spine no acute fracture or static subluxation of cervical spine  Seen in follow-up 07/24/20.  No recurrent near syncope nor syncope. Endorses occasional lightheadedness with position changes.  Orthostatic vital signs were positive with blood pressure lying 125/77 and standing 104/67.  He was recommended to reduce indicate of Diet Coke and stay well-hydrated.  He was also recommended to follow-up with pulmonology regarding concern for respiratory infection.  His syncope was thought to be due to vasovagal and ZIO monitoring was not recommended.  Seen in follow up 09/17/20 noting 3 falls since October.   The initial was in the setting of extreme coughing likely vasovagal.  Second was tripping over something.  Third was getting dizzy and lightheaded with exertion. He was  orthostatic and hypotensive, Losartan was discontinued and Lasix changed to PRN. He was recommended for ZIO and echocardiogram.   EKGs/Labs/Other Studies Reviewed:   The following studies were reviewed today:   EKG: No EKG is ordered today.  The ekg independently reviewed from 07/24/2020 demonstrated NSR 90 bpm with RBBB and possible old inferior infarct and anterolateral infarct. No acute ST/T wave changes. Stable compared to previous EKG.***  Recent Labs: 09/17/2020: ALT 20; BUN 30; Creatinine, Ser 1.42; Hemoglobin 10.9; Platelets 233; Potassium 5.2; Sodium 141; TSH 4.840  Recent Lipid Panel    Component Value Date/Time   CHOL 104 09/17/2020 1117   TRIG 97 09/17/2020 1117   HDL 41 09/17/2020 1117   CHOLHDL 2.5 09/17/2020 1117   CHOLHDL 2.8 10/16/2019 1424   VLDL 26 11/29/2017 1154   LDLCALC 44 09/17/2020 1117   LDLCALC 34 10/16/2019 1424   Home Medications   No outpatient medications have been marked as taking for the  10/29/20 encounter (Appointment) with Loel Dubonnet, NP.    Review of Systems  All other systems reviewed and are otherwise negative except as noted above.  Physical Exam   VS:  There were no vitals taken for this visit. , BMI There is no height or weight on file to calculate BMI.  Wt Readings from Last 3 Encounters:  09/17/20 245 lb (111.1 kg)  09/16/20 247 lb 12.8 oz (112.4 kg)  09/11/20 250 lb (113.4 kg)   *** GEN: Well nourished, overweight, well developed, in no acute distress. HEENT: normal. Neck: Supple, no JVD, carotid bruits, or masses. Cardiac: RRR, no murmurs, rubs, or gallops. No clubbing, cyanosis, edema. Radials/PT 2+ and equal bilaterally.  Respiratory:  Respirations regular and unlabored.  Inspiratory wheeze to bilateral lower lobes. GI: Soft, nontender, nondistended. MS: No deformity or atrophy. Skin: Warm and dry, no rash. Neuro:  Strength and sensation are intact. Psych: Normal affect.  Assessment & Plan    1. Syncope /  Fatigue / Orthostatic hypotension / Dizziness / Lightheadedness -ED visit 07/19/20 with syncope in the setting of coughing and vagal response.  Now with orthostatic hypotension and persistent lightheadedness. TSH, CMET, CBC today. 14 day ZIO placed today to rule out tachyarrhytmia as contributory. Echocardiogram ordered. Stop Losartan, Change Lasix to PRN. We will continue Metoprolol but have low threshold to reduce dose.   2. CAD - ***  3. 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 Previous intolerance to Entresto and carvedilol.  ***. Continue low-sodium, heartily diet.  Continue regular cardiovascular exercise.  Continue less than 2 L fluid restriction.  4. HTN -now with orthostatic hypotension.  Management plan, as above.  5. HLD - ***  6. DM2 - Continue to follow with PCP and endocrinology.***  7. OSA - CPAP compliance encouraged.  8. Asthma - Continue to follow with Dr. Raul Del of pulmonology.***   Disposition: Follow up *** with Dr. Fletcher Anon or APP.   Signed, Loel Dubonnet, NP 10/22/2020, 1:29 PM [facility]

## 2020-10-23 NOTE — Chronic Care Management (AMB) (Signed)
  Care Management   Note  10/23/2020 Name: [name] MRN: [id number] DOB: 1961/01/07  [name] is a 60 y.o. year old male who is a primary care patient of [name], MD and is actively engaged with the care management team. I reached out to [name] by phone today to assist with re-scheduling an initial visit with the Pharmacist  Follow up plan: Telephone appointment with care management team member scheduled for:11/20/2020  [facility]

## 2020-10-24 ENCOUNTER — Ambulatory Visit: Payer: Medicare Other | Admitting: Podiatry

## 2020-10-24 DIAGNOSIS — E1165 Type 2 diabetes mellitus with hyperglycemia: Secondary | ICD-10-CM | POA: Diagnosis not present

## 2020-10-24 DIAGNOSIS — Z794 Long term (current) use of insulin: Secondary | ICD-10-CM | POA: Diagnosis not present

## 2020-10-28 ENCOUNTER — Other Ambulatory Visit: Payer: Self-pay | Admitting: Family Medicine

## 2020-10-28 DIAGNOSIS — E063 Autoimmune thyroiditis: Secondary | ICD-10-CM

## 2020-10-29 ENCOUNTER — Telehealth: Payer: Self-pay | Admitting: *Deleted

## 2020-10-29 ENCOUNTER — Ambulatory Visit: Payer: Medicare Other | Admitting: Family

## 2020-10-29 NOTE — Telephone Encounter (Signed)
Attempted to call pt with Zio heart monitor results. No answer. Unable to leave voicemail.  Results have been released to [place]. Pt has follow up 1/28.

## 2020-10-29 NOTE — Telephone Encounter (Signed)
-----   Message from Loel Dubonnet, NP sent at 10/29/2020  7:46 AM EST ----- ZIO monitor showed predominantly normal sinus rhythm with average heart rate of 88bpm. Great result! Triggered events were associated with normal sinus rhythm or sinus tachycardia (fast, but regular heart beat) but no significant arrhythmia. He had 3 episodes of a fast heart beat in the top chamber of the heart which was asymptomatic and not of concern.

## 2020-10-31 ENCOUNTER — Encounter: Payer: Self-pay | Admitting: Anesthesiology

## 2020-10-31 ENCOUNTER — Other Ambulatory Visit: Payer: Self-pay | Admitting: Cardiovascular Disease

## 2020-10-31 ENCOUNTER — Other Ambulatory Visit: Payer: Self-pay | Admitting: Family Medicine

## 2020-10-31 ENCOUNTER — Ambulatory Visit: Payer: Medicare Other | Attending: Anesthesiology | Admitting: Anesthesiology

## 2020-10-31 ENCOUNTER — Other Ambulatory Visit: Payer: Self-pay

## 2020-10-31 DIAGNOSIS — A6923 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: Secondary | ICD-10-CM | POA: Diagnosis not present

## 2020-10-31 DIAGNOSIS — R29898 Other symptoms and signs involving the musculoskeletal system: Secondary | ICD-10-CM

## 2020-10-31 DIAGNOSIS — E063 Autoimmune thyroiditis: Secondary | ICD-10-CM

## 2020-10-31 DIAGNOSIS — M5432 Sciatica, left side: Secondary | ICD-10-CM

## 2020-10-31 DIAGNOSIS — G894 Chronic pain syndrome: Secondary | ICD-10-CM | POA: Diagnosis not present

## 2020-10-31 DIAGNOSIS — M542 Cervicalgia: Secondary | ICD-10-CM

## 2020-10-31 DIAGNOSIS — M5136 Other intervertebral disc degeneration, lumbar region: Secondary | ICD-10-CM | POA: Diagnosis not present

## 2020-10-31 DIAGNOSIS — M4716 Other spondylosis with myelopathy, lumbar region: Secondary | ICD-10-CM

## 2020-10-31 DIAGNOSIS — M51369 Other intervertebral disc degeneration, lumbar region without mention of lumbar back pain or lower extremity pain: Secondary | ICD-10-CM

## 2020-10-31 DIAGNOSIS — F1190 Opioid use, unspecified, uncomplicated: Secondary | ICD-10-CM

## 2020-10-31 DIAGNOSIS — G7000 Myasthenia gravis without (acute) exacerbation: Secondary | ICD-10-CM | POA: Diagnosis not present

## 2020-10-31 MED ORDER — OXYCODONE HCL 10 MG PO TABS: 10.0000 mg | ORAL_TABLET | Freq: Four times a day (QID) | ORAL | 0 refills | Status: DC

## 2020-10-31 NOTE — Progress Notes (Signed)
Virtual Visit via Telephone Note  I connected with [name] on 10/31/20 at  9:50 AM EST by telephone and verified that I am speaking with the correct person using two identifiers.  Location: Patient: Home Provider: [facility] control center   I discussed the limitations, risks, security and privacy concerns of performing an evaluation and management service by telephone and the availability of in person appointments. I also discussed with the patient that there may be a patient responsible charge related to this service. The patient expressed understanding and agreed to proceed.   History of Present Illness: I spoke with [name] today via telephone as we were unable to link up with the video portion of the virtual conference.  He reports that he did quite well following his last epidural.  This was done back in December and he still having good relief.  He has minimal lower leg pain or hip pain at this time.  He has some occasional back pain and he continues to take his oxycodone 4 times a day for this.  He reports 75 to 80% relief lasting 4 to 6 hours.  He has recurrence of his back pain if he does not take his next dose.  Unfortunately he has failed other conservative therapy.  He also has troubles with sugar management following the epidurals and we will need to gain some additional assistance from [name] if he requires another epidural in the coming months.  He reports that he continues to derive good functional benefit from the opioids and no side effects are reported.   Observations/Objective:  Current Outpatient Medications:  .  [START ON 12/10/2020] Oxycodone HCl 10 MG TABS, Take 1 tablet (10 mg total) by mouth in the morning, at noon, in the evening, and at bedtime., Disp: 120 tablet, Rfl: 0 .  albuterol (PROVENTIL HFA;VENTOLIN HFA) 108 (90 Base) MCG/ACT inhaler, Inhale 2 puffs into the lungs every 6 (six) hours as needed for wheezing or shortness of breath. , Disp: , Rfl:  .   albuterol (PROVENTIL) (2.5 MG/3ML) 0.083% nebulizer solution, Take 2.5 mg by nebulization every 6 (six) hours as needed for wheezing or shortness of breath. , Disp: , Rfl:  .  aspirin EC 81 MG tablet, Take 1 tablet (81 mg total) by mouth daily., Disp: 90 tablet, Rfl: 3 .  benzonatate (TESSALON PERLES) 100 MG capsule, Take 2 capsules (200 mg total) by mouth 3 (three) times daily as needed for cough., Disp: 20 capsule, Rfl: 1 .  betamethasone valerate ointment (VALISONE) 0.1 %, Apply topically. , Disp: , Rfl:  .  clopidogrel (PLAVIX) 75 MG tablet, TAKE 1 TABLET(75 MG) BY MOUTH DAILY WITH BREAKFAST, Disp: 30 tablet, Rfl: 2 .  dexlansoprazole (DEXILANT) 60 MG capsule, Take 1 capsule (60 mg total) by mouth daily., Disp: 30 capsule, Rfl: 11 .  DULoxetine (CYMBALTA) 60 MG capsule, TAKE 1 CAPSULE(60 MG) BY MOUTH DAILY, Disp: 90 capsule, Rfl: 0 .  ezetimibe (ZETIA) 10 MG tablet, Take 1 tablet (10 mg total) by mouth daily., Disp: 90 tablet, Rfl: 2 .  Fluticasone-Umeclidin-Vilant (TRELEGY ELLIPTA) 100-62.5-25 MCG/INH AEPB, Inhale 1 each into the lungs daily. In place of breo, Disp: 60 each, Rfl: 2 .  furosemide (LASIX) 20 MG tablet, Take 1 tablet (20 mg total) by mouth daily as needed (for weight gain of 2 pounds overnight or 5 pounds in 1 week.)., Disp: 30 tablet, Rfl: 3 .  gabapentin (NEURONTIN) 300 MG capsule, Take 1 capsule (300 mg total) by mouth  3 (three) times daily., Disp: 90 capsule, Rfl: 1 .  gabapentin (NEURONTIN) 800 MG tablet, Take 800 mg by mouth at bedtime., Disp: , Rfl:  .  insulin lispro (HUMALOG) 100 UNIT/ML KiwkPen, Inject 7-8 Units into the skin 3 (three) times daily. Reported on 12/02/2015/ sliding scale 1 unit for every 8 units of carbs; and 1 unit for every 20 above 120, Disp: , Rfl:  .  Insulin Pen Needle 32G X 4 MM MISC, Inject 1 each into the skin as needed., Disp: , Rfl:  .  LANTUS SOLOSTAR 100 UNIT/ML Solostar Pen, Inject 25-30 Units into the skin daily at 10 pm. , Disp: , Rfl: 0 .   levocetirizine (XYZAL) 5 MG tablet, TAKE 1 TABLET(5 MG) BY MOUTH EVERY EVENING, Disp: 30 tablet, Rfl: 3 .  levothyroxine (SYNTHROID) 25 MCG tablet, TAKE 1 TABLET(25 MCG) BY MOUTH DAILY BEFORE BREAKFAST, Disp: 90 tablet, Rfl: 0 .  levothyroxine (SYNTHROID) 50 MCG tablet, Take by mouth., Disp: , Rfl:  .  liraglutide (VICTOZA) 18 MG/3ML SOPN, 1.8 units every morning (Patient taking differently: Inject 1.8 mg into the skin daily.), Disp: , Rfl:  .  loratadine (CLARITIN) 10 MG tablet, Take 10 mg by mouth daily. , Disp: , Rfl:  .  metFORMIN (GLUCOPHAGE) 1000 MG tablet, Take 1,000 mg by mouth 2 (two) times daily with a meal., Disp: , Rfl:  .  metoprolol succinate (TOPROL-XL) 100 MG 24 hr tablet, TAKE 1 TABLET(100 MG) BY MOUTH DAILY WITH OR IMMEDIATELY FOLLOWING A MEAL, Disp: 90 tablet, Rfl: 3 .  montelukast (SINGULAIR) 10 MG tablet, Take 10 mg by mouth at bedtime., Disp: , Rfl:  .  Multiple Vitamins-Minerals (MENS MULTI VITAMIN & MINERAL PO), Take 1 tablet by mouth daily., Disp: , Rfl:  .  naloxone (NARCAN) nasal spray 4 mg/0.1 mL, For excess sedation from opioids, Disp: 1 kit, Rfl: 2 .  nitroGLYCERIN (NITROSTAT) 0.3 MG SL tablet, Place 1 tablet (0.3 mg total) under the tongue every 5 (five) minutes as needed for chest pain. Maximum 3 doses., Disp: 25 tablet, Rfl: 3 .  [START ON 11/10/2020] Oxycodone HCl 10 MG TABS, Take 1 tablet (10 mg total) by mouth in the morning, at noon, in the evening, and at bedtime., Disp: 120 tablet, Rfl: 0 .  rosuvastatin (CRESTOR) 5 MG tablet, Take 5 mg by mouth daily., Disp: , Rfl: 0 .  Semaglutide,0.25 or 0.5MG /DOS, 2 MG/1.5ML SOPN, Inject into the skin., Disp: , Rfl:  .  tamsulosin (FLOMAX) 0.4 MG CAPS capsule, TAKE 2 CAPSULES BY MOUTH DAILY, Disp: 60 capsule, Rfl: 2 Assessment and Plan: 1. Left leg weakness   2. Lumbar spondylosis with myelopathy   3. DDD (degenerative disc disease), lumbar   4. Chronic pain syndrome   5. Chronic, continuous use of opioids   6. Sciatica,  left side   7. Cervicalgia   8. Lyme arthritis of multiple joints ([place])   9. Left arm weakness   10. Myasthenia gravis [facility])   Based on our discussion today and upon review of the [place] practitioner database information going to refill his medications for the next 2 months dated for February 6 and March 8.  No other changes will be made in his pain management protocol.  I have encouraged him to continue with stretching strengthening exercises as well as weight loss.  He will continue to follow-up with [name] regarding his blood sugar management.  Is likely we will need additional assistance if he is to pursue a  repeat epidural.  He generally gets these approximately every 3 to 4 months.  I also encouraged him to continue follow-up with his other primary care physicians.  With return to clinic as mentioned  Follow Up Instructions:    I discussed the assessment and treatment plan with the patient. The patient was provided an opportunity to ask questions and all were answered. The patient agreed with the plan and demonstrated an understanding of the instructions.   The patient was advised to call back or seek an in-person evaluation if the symptoms worsen or if the condition fails to improve as anticipated.  I provided 30 minutes of non-face-to-face time during this encounter.   Molli Barrows, MD

## 2020-11-01 ENCOUNTER — Other Ambulatory Visit: Payer: Medicare Other

## 2020-11-05 ENCOUNTER — Other Ambulatory Visit: Payer: Self-pay | Admitting: Family Medicine

## 2020-11-05 MED ORDER — LEVOTHYROXINE SODIUM 50 MCG PO TABS: 50.0000 ug | ORAL_TABLET | Freq: Every day | ORAL | 1 refills | Status: DC

## 2020-11-05 NOTE — Telephone Encounter (Signed)
Called patient twice to ask him about dose. No answer.  Sent [employer].

## 2020-11-07 ENCOUNTER — Ambulatory Visit: Payer: Medicare Other | Admitting: Family

## 2020-11-11 ENCOUNTER — Encounter: Payer: Self-pay | Admitting: Podiatry

## 2020-11-11 ENCOUNTER — Other Ambulatory Visit: Payer: Self-pay

## 2020-11-11 ENCOUNTER — Ambulatory Visit: Payer: Medicare Other | Admitting: Podiatry

## 2020-11-11 DIAGNOSIS — E1142 Type 2 diabetes mellitus with diabetic polyneuropathy: Secondary | ICD-10-CM | POA: Diagnosis not present

## 2020-11-11 DIAGNOSIS — B351 Tinea unguium: Secondary | ICD-10-CM | POA: Diagnosis not present

## 2020-11-11 DIAGNOSIS — M79675 Pain in left toe(s): Secondary | ICD-10-CM

## 2020-11-11 DIAGNOSIS — M79674 Pain in right toe(s): Secondary | ICD-10-CM | POA: Diagnosis not present

## 2020-11-11 NOTE — Progress Notes (Signed)
This patient presents  to my office for at risk foot care.  This patient requires this care by a professional since this patient will be at risk due to having diabetic neuropathy.  This patient is unable to cut nails himself since the patient cannot reach his nails.These nails are painful walking and wearing shoes.  This patient presents for at risk foot care today.  General Appearance  Alert, conversant and in no acute stress.  Vascular  Dorsalis pedis and posterior tibial  pulses are palpable  bilaterally.  Capillary return is within normal limits  bilaterally. Temperature is within normal limits  bilaterally.  Neurologic  Senn-Weinstein monofilament wire test absent   bilaterally. Muscle power within normal limits bilaterally.  Nails Thick disfigured discolored nails with subungual debris  Hallux nails bilaterally. No evidence of bacterial infection or drainage bilaterally.  Orthopedic  No limitations of motion  feet .  No crepitus or effusions noted.  No bony pathology or digital deformities noted.  Mild  MTA.  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  Skin   No porokeratosis noted bilaterally.  No signs of infections or ulcers noted.  Dry peeling skin plantarly both feet.   Onychomycosis Hallux    Pain in right toes  Pain in left toes  Diabetic neuropathy.  Consent was obtained for treatment procedures.   Mechanical debridement of nails 1-5  bilaterally performed with a nail nipper.  Filed with dremel without incident. Diabetic foot exam reveals absent LOPS  B/L.  Patient qualifies for diabetic shoes for DPN and DJD  B/L.   Return office visit   3 months                  Told patient to return for periodic foot care and evaluation due to potential at risk complications.   Gardiner Barefoot DPM

## 2020-11-15 DIAGNOSIS — J9801 Acute bronchospasm: Secondary | ICD-10-CM | POA: Diagnosis not present

## 2020-11-15 DIAGNOSIS — G4733 Obstructive sleep apnea (adult) (pediatric): Secondary | ICD-10-CM | POA: Diagnosis not present

## 2020-11-15 DIAGNOSIS — R059 Cough, unspecified: Secondary | ICD-10-CM | POA: Diagnosis not present

## 2020-11-15 DIAGNOSIS — R0600 Dyspnea, unspecified: Secondary | ICD-10-CM | POA: Diagnosis not present

## 2020-11-20 ENCOUNTER — Telehealth: Payer: Medicare Other

## 2020-12-03 ENCOUNTER — Other Ambulatory Visit: Payer: Self-pay

## 2020-12-03 ENCOUNTER — Telehealth: Payer: Self-pay

## 2020-12-03 ENCOUNTER — Ambulatory Visit (INDEPENDENT_AMBULATORY_CARE_PROVIDER_SITE_OTHER): Payer: Medicare Other

## 2020-12-03 DIAGNOSIS — R0600 Dyspnea, unspecified: Secondary | ICD-10-CM

## 2020-12-03 DIAGNOSIS — R55 Syncope and collapse: Secondary | ICD-10-CM | POA: Diagnosis not present

## 2020-12-03 DIAGNOSIS — I5022 Chronic systolic (congestive) heart failure: Secondary | ICD-10-CM | POA: Diagnosis not present

## 2020-12-03 LAB — ECHOCARDIOGRAM COMPLETE
Area-P 1/2: 6.96 cm2
S' Lateral: 3.5 cm

## 2020-12-03 MED ORDER — PERFLUTREN LIPID MICROSPHERE
1.0000 mL | INTRAVENOUS | Status: AC | PRN
  Administered 2020-12-03: 3 mL via INTRAVENOUS

## 2020-12-03 NOTE — Progress Notes (Signed)
° °Chronic Care Management °Pharmacy Note ° °12/09/2020 °Name:  [name] MRN:  [id number] DOB:  01/18/1961 ° °Subjective: °[name] is an 60 y.o. year old male who is a primary patient of Sowles, Krichna, MD.  The CCM team was consulted for assistance with disease management and care coordination needs.   ° °Engaged with patient by telephone for initial visit in response to provider referral for pharmacy case management and/or care coordination services.  ° °Consent to Services:  °The patient was given the following information about Chronic Care Management services today, agreed to services, and gave verbal consent: 1. CCM service includes personalized support from designated clinical staff supervised by the primary care provider, including individualized plan of care and coordination with other care providers 2. 24/7 contact phone numbers for assistance for urgent and routine care needs. 3. Service will only be billed when office clinical staff spend 20 minutes or more in a month to coordinate care. 4. Only one practitioner may furnish and bill the service in a calendar month. 5.The patient may stop CCM services at any time (effective at the end of the month) by phone call to the office staff. 6. The patient will be responsible for cost sharing (co-pay) of up to 20% of the service fee (after annual deductible is met). Patient agreed to services and consent obtained. ° °Patient Care Team: °Sowles, Krichna, MD as PCP - General (Family Medicine) °Arida, Muhammad A, MD as PCP - Cardiology (Cardiology) °Fleming, Herbon E, MD as Consulting Physician (Pulmonary Disease) °Adams, James G, MD as Consulting Physician (Anesthesiology) °O'Connell, Thomas L, MD as Consulting Physician (Endocrinology) °Mehrabyan, Anahit Cheluskin, MD as Referring Physician (Neurology) °Wohl, Darren, MD as Consulting Physician (Gastroenterology) °Fleury, Alexandre A, RPH (Pharmacist) ° °Recent office visits: °09/16/20: Patient  presented to Dr. Sowles for follow-up. A1c 8.4%. Pyridostigmine stopped.  ° °Recent consult visits: °11/15/20: Patient presented to Dr. Fleming (Pulmonology) for cough.  °09/24/20: Patient presented to Dr. O'Connell (Endocrinology) for follow-up. Humalog increased. Levothyroxine increased to 50 mcg daily.  ° °Hospital visits: °None in previous 6 months ° °Objective: ° °Lab Results  °Component Value Date  ° CREATININE 1.42 (H) 09/17/2020  ° BUN 30 (H) 09/17/2020  ° GFRNONAA 53 (L) 09/17/2020  ° GFRAA 61 09/17/2020  ° NA 141 09/17/2020  ° K 5.2 09/17/2020  ° CALCIUM 9.4 09/17/2020  ° CO2 24 09/17/2020  ° ° °Lab Results  °Component Value Date/Time  ° HGBA1C 8.4 (A) 09/16/2020 11:47 AM  ° HGBA1C 7.5 (H) 10/16/2019 02:24 PM  ° HGBA1C 7.9 (H) 05/05/2019 10:12 AM  ° HGBA1C 7.8 10/06/2018 12:00 AM  ° MICROALBUR 1.4 05/05/2019 10:12 AM  ° MICROALBUR 204 05/18/2018 12:00 AM  ° MICROALBUR 76.1 05/18/2018 12:00 AM  ° MICROALBUR Neg 01/25/2018 11:59 AM  °  °Last diabetic Eye exam:  °Lab Results  °Component Value Date/Time  ° HMDIABEYEEXA Retinopathy (A) 08/02/2019 12:00 AM  °  °Last diabetic Foot exam: No results found for: HMDIABFOOTEX  ° °Lab Results  °Component Value Date  ° CHOL 104 09/17/2020  ° HDL 41 09/17/2020  ° LDLCALC 44 09/17/2020  ° TRIG 97 09/17/2020  ° CHOLHDL 2.5 09/17/2020  ° ° °Hepatic Function Latest Ref Rng & Units 09/17/2020 10/16/2019 05/05/2019  °Total Protein 6.0 - 8.5 g/dL 6.1 6.1 5.9(L)  °Albumin 3.8 - 4.8 g/dL 4.2 - -  °AST 0 - 40 IU/L 17 17 16  °ALT 0 - 44 IU/L 20 18 14  °Alk Phosphatase 44 -   121 IU/L 90 - -  °Total Bilirubin 0.0 - 1.2 mg/dL 0.3 0.4 0.4  °Bilirubin, Direct 0.00 - 0.40 mg/dL - - -  ° ° °Lab Results  °Component Value Date/Time  ° TSH 4.840 (H) 09/17/2020 11:17 AM  ° TSH 3.00 10/16/2019 02:24 PM  ° ° °CBC Latest Ref Rng & Units 09/17/2020 07/18/2020 02/03/2019  °WBC 3.4 - 10.8 x10E3/uL 8.8 5.5 5.9  °Hemoglobin 13.0 - 17.7 g/dL 10.9(L) 9.7(L) 11.5(L)  °Hematocrit 37.5 - 51.0 % 34.8(L) 31.8(L)  35.9(L)  °Platelets 150 - 450 x10E3/uL 233 239 178  ° ° °Lab Results  °Component Value Date/Time  ° VD25OH 72 10/16/2019 02:24 PM  ° ° °Clinical ASCVD: Yes  °The ASCVD Risk score (Goff DC Jr., et al., 2013) failed to calculate for the following reasons: °  The patient has a prior MI or stroke diagnosis   ° °Depression screen PHQ 2/9 09/16/2020 09/11/2020 07/23/2020  °Decreased Interest 1 0 0  °Down, Depressed, Hopeless 1 0 0  °PHQ - 2 Score 2 0 0  °Altered sleeping 1 - -  °Tired, decreased energy 1 - -  °Change in appetite 2 - -  °Feeling bad or failure about yourself  0 - -  °Trouble concentrating 1 - -  °Moving slowly or fidgety/restless 1 - -  °Suicidal thoughts 0 - -  °PHQ-9 Score 8 - -  °Difficult doing work/chores - - -  °Some recent data might be hidden  °  ° °Social History  ° °Tobacco Use  °Smoking Status Never Smoker  °Smokeless Tobacco Never Used  °Tobacco Comment  ° smoking cessation materials not required  ° °BP Readings from Last 3 Encounters:  °09/17/20 108/75  °09/16/20 136/84  °09/11/20 129/70  ° °Pulse Readings from Last 3 Encounters:  °09/17/20 90  °09/16/20 89  °09/11/20 81  ° °Wt Readings from Last 3 Encounters:  °09/17/20 245 lb (111.1 kg)  °09/16/20 247 lb 12.8 oz (112.4 kg)  °09/11/20 250 lb (113.4 kg)  ° ° °Assessment/Interventions: Review of patient past medical history, allergies, medications, health status, including review of consultants reports, laboratory and other test data, was performed as part of comprehensive evaluation and provision of chronic care management services.  ° °SDOH:  (Social Determinants of Health) assessments and interventions performed: Yes °SDOH Interventions   °Flowsheet Row Most Recent Value  °SDOH Interventions   °Financial Strain Interventions Other (Comment)  [PAP]  °  ° ° °CCM Care Plan ° °Allergies  °Allergen Reactions  °• Azathioprine Other (See Comments)  °  Azathioprine hypersensitivity reaction  °• Novolog [Insulin Aspart] Hives  ° ° °Medications  Reviewed Today   ° Reviewed by Fleury, Alexandre A, RPH (Pharmacist) on 12/04/20 at 1453  Med List Status: <None>  °Medication Order Taking? Sig Documenting Provider Last Dose Status Informant  °albuterol (PROVENTIL HFA;VENTOLIN HFA) 108 (90 Base) MCG/ACT inhaler [id number] Yes Inhale 2 puffs into the lungs every 6 (six) hours as needed for wheezing or shortness of breath.  [provider] Taking Active Pharmacy Records  °albuterol (PROVENTIL) (2.5 MG/3ML) 0.083% nebulizer solution [id number] Yes Take 2.5 mg by nebulization every 6 (six) hours as needed for wheezing or shortness of breath.  [provider] Taking Active Pharmacy Records  °aspirin EC 81 MG tablet [id number] Yes Take 1 tablet (81 mg total) by mouth daily. Berge, Christopher Ronald, NP Taking Active Pharmacy Records  °clopidogrel (PLAVIX) 75 MG tablet [id number] Yes TAKE 1 TABLET(75 MG) BY MOUTH DAILY WITH BREAKFAST Arida, Muhammad   A, MD Taking Active   cyclobenzaprine (FLEXERIL) 10 MG tablet [id number] Yes Take 10 mg by mouth 2 (two) times daily as needed. [provider] Taking Active   dexlansoprazole (DEXILANT) 60 MG capsule [id number] Yes Take 1 capsule (60 mg total) by mouth daily. [name], MD Taking Active   DULoxetine (CYMBALTA) 60 MG capsule [id number] Yes TAKE 1 CAPSULE(60 MG) BY MOUTH DAILY Sowles, Drue Stager, MD Taking Active   ezetimibe (ZETIA) 10 MG tablet [id number]  Take 1 tablet (10 mg total) by mouth daily. [name], MD  Expired 12/03/20 2359   Fluticasone-Umeclidin-Vilant (TRELEGY ELLIPTA) 100-62.5-25 MCG/INH AEPB [id number] Yes Inhale 1 each into the lungs daily. In place of breo [name], MD Taking Active   gabapentin (NEURONTIN) 300 MG capsule [id number] Yes Take 1 capsule (300 mg total) by mouth 3 (three) times daily. Roselee Nova, MD Taking Active Pharmacy Records           Med Note ([place], [place] Rocker   Thu Feb 09, 2019 10:51 AM)    gabapentin (NEURONTIN) 800 MG tablet  [id number] Yes Take 800 mg by mouth at bedtime. Mehrabyan, Anahit Cheluskin, MD Taking Active   insulin lispro (HUMALOG) 100 UNIT/ML KiwkPen [id number] Yes Inject 7-8 Units into the skin 3 (three) times daily. Reported on 12/02/2015/ sliding scale 1 unit for every 7 units of carbs; and 1 unit for every 20 above 120 [provider] Taking Active Pharmacy Records  Insulin Pen Needle 32G X 4 MM MISC [id number]  Inject 1 each into the skin as needed. [provider]  Active Pharmacy Records  LANTUS SOLOSTAR 100 UNIT/ML Solostar Pen [id number] Yes Inject 31-33 Units into the skin daily at 10 pm. Sherri Sear, MD Taking Active Pharmacy Records           Med Note [facility] [place], [place] Rocker   Thu Feb 09, 2019 10:52 AM)    levocetirizine (XYZAL) 5 MG tablet [id number] Yes TAKE 1 TABLET(5 MG) BY MOUTH EVERY Lorelei Pont, Drue Stager, MD Taking Active   levothyroxine (SYNTHROID) 50 MCG tablet [id number] Yes Take 1 tablet (50 mcg total) by mouth daily before breakfast. One daily and two on Sundays - recheck level in 6 weeks [name], MD Taking Active   loratadine (CLARITIN) 10 MG tablet [id number] Yes Take 10 mg by mouth daily.  [provider] Taking Active Pharmacy Records  metFORMIN (GLUCOPHAGE) 1000 MG tablet [id number] Yes Take 1,000 mg by mouth 2 (two) times daily with a meal. Sherri Sear, MD Taking Active Pharmacy Records  metoprolol succinate (TOPROL-XL) 100 MG 24 hr tablet [id number] Yes TAKE 1 TABLET(100 MG) BY MOUTH DAILY WITH OR IMMEDIATELY FOLLOWING A MEAL Arida, Mertie Clause, MD Taking Active   montelukast (SINGULAIR) 10 MG tablet [id number] Yes Take 10 mg by mouth at bedtime. [provider] Taking Active   Multiple Vitamins-Minerals (MENS MULTI VITAMIN & MINERAL PO) [id number] Yes Take 1 tablet by mouth in the morning and at bedtime. [provider] Taking Active   naloxone [facility]) nasal spray 4 mg/0.1 mL [id number] Yes For excess sedation from  opioids Molli Barrows, MD Taking Active Pharmacy Records           Med Note Loni Dolly, LATISHA A   Fri Jun 10, 2018  9:27 AM) PRN  nitroGLYCERIN (NITROSTAT) 0.3 MG SL tablet [id number]  Place 1 tablet (0.3 mg total) under the tongue every 5 (five) minutes as needed for chest pain. Maximum 3 doses. Murray Hodgkins  Jori Moll, NP  Active   Oxycodone HCl 10 MG TABS [id number] Yes Take 1 tablet (10 mg total) by mouth in the morning, at noon, in the evening, and at bedtime. Molli Barrows, MD Taking Active   Oxycodone HCl 10 MG TABS [id number] Yes Take 1 tablet (10 mg total) by mouth in the morning, at noon, in the evening, and at bedtime. Molli Barrows, MD Taking Active   predniSONE (DELTASONE) 5 MG tablet [id number] Yes Take 5 mg by mouth daily. [provider] Taking Active   rosuvastatin (CRESTOR) 5 MG tablet [id number] Yes Take 5 mg by mouth daily. [name], MD Taking Active Pharmacy Records  Semaglutide,0.25 or 0.5MG/DOS, 2 MG/1.5ML [facility] [id number] Yes Inject 0.5 mg into the skin once a week. [provider] Taking Active   tamsulosin (FLOMAX) 0.4 MG CAPS capsule [id number] Yes TAKE 2 CAPSULES BY MOUTH DAILY Stoioff, Ronda Fairly, MD Taking Active   Med List Note Landis Martins, RN 09/11/20 1444): uds 02-20-21 MR 11-10-20          Patient Active Problem List   Diagnosis Date Noted   Pain due to onychomycosis of toenails of both feet 11/11/2020   Lung involvement associated with another disorder ([place]) 05/17/2020   Left leg weakness 12/01/2019   Left arm weakness 10/04/2019   Lower urinary tract symptoms 09/15/2019   Incomplete bladder emptying 09/15/2019   Carpal tunnel syndrome on both sides 07/12/2019   Weakness generalized 06/21/2019   Idiopathic peripheral neuropathy 04/21/2019   Sensory ataxia 03/09/2019   Colitis 07/08/2018   OSA on CPAP 07/08/2018   Congestive heart failure (CHF) ([place]) 05/05/2018   Unstable angina ([place]) 04/26/2018   Ischemic  cardiomyopathy 04/26/2018   Chronic combined systolic and diastolic CHF (congestive heart failure) ([place]) 04/26/2018   Effort angina ([place]) 04/25/2018   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[place]) 01/25/2018   Hyperlipidemia due to type 2 diabetes mellitus ([place]) 08/12/2017   Vitamin D deficiency, unspecified 08/12/2017   Ptosis of left eyelid 01/08/2017   Dermatitis 12/24/2016   Difficulty walking 04/27/2016   Foot cramps 04/27/2016   Morbid (severe) obesity due to excess calories ([place]) 04/06/2016   Benign neoplasm of sigmoid colon    Benign neoplasm of descending colon    Benign neoplasm of transverse colon    Coronary artery disease involving native coronary artery with angina pectoris ([place]) 10/22/2015   Coronary artery disease 08/22/2015   DM (diabetes mellitus), type 2, uncontrolled with complications ([place]) 71/69/6789   Myasthenia gravis ([place]) 08/22/2015   Chronic left-sided low back pain 08/22/2015   Hypertension 08/22/2015   GERD (gastroesophageal reflux disease) 08/22/2015   Arthritis 08/22/2015   Diabetic peripheral neuropathy ([place]) 08/22/2015   Hyperlipidemia 08/22/2015   Insomnia 08/22/2015   Type 2 diabetes mellitus with diabetic polyneuropathy, with long-term current use of insulin ([place]) 08/22/2015   Asthma 08/22/2015   Lyme disease 06/05/1992    Immunization History  Administered Date(s) Administered   Influenza Inj Mdck Quad Pf 06/14/2019   Influenza,inj,Quad PF,6+ Mos 06/16/2017, 06/10/2018, 07/23/2020   PFIZER(Purple Top)SARS-COV-2 Vaccination 12/22/2019, 01/16/2020, 08/05/2020   Pneumococcal Conjugate-13 07/21/2019   Pneumococcal Polysaccharide-23 01/25/2018   Td 04/04/2013    Conditions to be addressed/monitored:  Hypertension, Hyperlipidemia, Diabetes, Heart Failure, Coronary Artery Disease and Asthma  Care Plan : General Pharmacy (Adult)  Updates made by Germaine Pomfret, RPH since 12/09/2020 12:00 AM     Problem: Hypertension, Hyperlipidemia, Diabetes, Heart Failure, Coronary Artery Disease and Asthma  Priority: High    Long-Range Goal: Patient-Specific Goal   Start Date: 12/05/2020  Expected End Date: 06/11/2021  This Visit's Progress: On track  Priority: High  Note:   Current Barriers:   Unable to independently afford treatment regimen  Pharmacist Clinical Goal(s):   Over the next 90 days, patient will verbalize ability to afford treatment regimen through collaboration with PharmD and provider.   Interventions:  1:1 collaboration with [name], MD regarding development and update of comprehensive plan of care as evidenced by provider attestation and co-signature  Inter-disciplinary care team collaboration (see longitudinal plan of care)  Comprehensive medication review performed; medication list updated in electronic medical record  Hyperlipidemia: (LDL goal < 70) -History of MI in 2010 -Controlled -Current treatment:  Ezetimibe 10 mg daily   Rosuvastatin 5 mg daily  -Current antiplatelet treatment:  Aspirin 81 mg daily   Clopidogrel 75 mg daily  -Medications previously tried: NA  -Cramping on higher doses of rosuvastatin  -Educated on Importance of limiting foods high in cholesterol; Exercise goal of 150 minutes per week; -Recommended to continue current medication  Diabetes (A1c goal <8%) -Controlled -Current medications:  Humalog 1 unit per 7g of carb, 1 unit for every 20 glucose above 120 three times daily   Lantus 33 units daily   Metformin 1000 mg twice daily   Ozempic 0.5 mg weekly  -Medications previously tried: Victoza  -Current home glucose readings  fasting glucose: 85-120 -Denies hypoglycemic/hyperglycemic symptoms -Educated onPrevention and management of hypoglycemic episodes; Carbohydrate counting and/or plate method -Counseled to check feet daily and get yearly eye exams -Recommended Farxiga 5 mg daily for diabetes and HF  hospitalization Assessed patient finances. Will start PAP for insulin, Ozempic   Heart Failure (Goal: control symptoms and prevent exacerbations) -Controlled Type: Systolic -NYHA Class: II (slight limitation of activity) -Ejection fraction: 35-40% (Date: 12/03/20) -Current treatment:  Furosemide 20 mg daily as needed  Metoprolol XL 100 mg daily  -Medications previously tried: bisprolol, HCTZ, carvedilol  -Current home BP/HR readings: NA -Current dietary habits: Following low carb, low sodium diet.  -Significant dizziness when taking furosemide.  -Educated on Importance of weighing daily; if you gain more than 3 pounds in one day or 5 pounds in one week, contact provider  -Recommended Farxiga 5 mg daily for diabetes and HF hospitalization  Asthma/COPD (Goal: control symptoms and prevent exacerbations) -Controlled -Current treatment   Albuterol 2 puffs every 6 hours as needed   Albuterol 3 mL nebulizer every 6 hours as needed   Montelukast 10 mg nightly  Trelegy 1 puff daily   -Current allergic rhinitis treatment   Levocetirizine 5 mg nightly   Loratadine 10 mg daily   -Medications previously tried: NA  -Pulmonary function testing: NA -Exacerbations requiring treatment in last 6 months: yes, multiple steroid bursts  -Patient reports consistent use of maintenance inhaler -Frequency of rescue inhaler use: ~1-2 times weekly  -Counseled on Proper inhaler technique; -Recommended to continue current medication Assessed patient finances. Will start PAP for Trelegy  GERD (Goal: Prevent heartburn or reflux symptoms) -Controlled -Current treatment   Dexlansoprazole 60 mg daily  -Medications previously tried: omeprazole (ineffective)   -Significant rebound symptoms if skips daily  -Recommended to continue current medication Assessed patient finances. Will start PAP for Dexilant   Chronic Pain (Goal: Maintain adequate pain relief) -Controlled -Current treatment    Cyclobenzaprine 10 mg twice daily as needed   Gabapentin 300 mg 1 capsule three times daily   Gabapentin 800 mg nightly  Oxycodone 10 mg four times daily  -Medications previously tried: NA -Counseled patient on risk of CNS depression, especially with combined use of oxycodone with gabapentin, cyclobenzaprine, or alcohol use.   -Recommended to continue current medication  Urinary Obstruction (Goal: maintain stable urine flow) -Controlled -Current treatment   Tamsulosin 0.4 mg 2 capsules daily  -Medications previously tried: NA  -Much improved since starting tamsulosin. Could consider decreased to 0.4 mg daily if symptoms stable to minimize risk of dizziness, will address at future visit.  -Recommended to continue current medication  Depression/Anxiety (Goal: achieve stable mood) -Uncontrolled -Current treatment:  Duloxetine 60 mg daily  -Medications previously tried/failed: NA -PHQ9: 8 -GAD7: 5 -Connected with none for mental health support. Planning on establishing with behavioral health, but has not set up yet.  -Educated on Benefits of medication for symptom control Benefits of cognitive-behavioral therapy with or without medication -Recommended to continue current medication   Patient Goals/Self-Care Activities  Over the next 90 days, patient will:  - check glucose at least 3 times daily , document, and provide at future appointments check blood pressure weekly , document, and provide at future appointments weigh daily, and contact provider if weight gain of greater than 3 pounds in one day  Follow Up Plan: Telephone follow up appointment with care management team member scheduled for: 03/06/2021 at 1:00 PM      Medication Assistance: Application for Ozempic, Humalog, Lantus, Trelegy, Dexilant  medication assistance program. in process.  Anticipated assistance start date 02/01/2021.  See plan of care for additional detail.  Patient's preferred pharmacy  is:  [id number] [address], [address] [place] [place] [address] Phone: [phone] Fax: [phone]  Uses pill box? Yes Pt endorses 100% compliance  We discussed: Current pharmacy is preferred with insurance plan and patient is satisfied with pharmacy services Patient decided to: Continue current medication management strategy  Care Plan and Follow Up Patient Decision:  Patient agrees to Care Plan and Follow-up.  Plan: Telephone follow up appointment with care management team member scheduled for:  03/06/2021 at 1:00 PM   [facility] [phone]

## 2020-12-03 NOTE — Progress Notes (Signed)
Chronic Care Management Pharmacy Assistant   Name: [name]  MRN: [id number] DOB: June 15, 1961  Reason for Encounter: Medication Review /initial question for initial visit on 12/04/2020.  PCP : [name], MD  Allergies:   Allergies  Allergen Reactions  . Azathioprine Other (See Comments)    Azathioprine hypersensitivity reaction  . Novolog [Insulin Aspart] Hives    Medications: Outpatient Encounter Medications as of 12/03/2020  Medication Sig Note  . albuterol (PROVENTIL HFA;VENTOLIN HFA) 108 (90 Base) MCG/ACT inhaler Inhale 2 puffs into the lungs every 6 (six) hours as needed for wheezing or shortness of breath.    [name] albuterol (PROVENTIL) (2.5 MG/3ML) 0.083% nebulizer solution Take 2.5 mg by nebulization every 6 (six) hours as needed for wheezing or shortness of breath.    [name] aspirin EC 81 MG tablet Take 1 tablet (81 mg total) by mouth daily.   . benzonatate (TESSALON PERLES) 100 MG capsule Take 2 capsules (200 mg total) by mouth 3 (three) times daily as needed for cough.   . betamethasone valerate ointment (VALISONE) 0.1 % Apply topically.    . clopidogrel (PLAVIX) 75 MG tablet TAKE 1 TABLET(75 MG) BY MOUTH DAILY WITH BREAKFAST   . cyclobenzaprine (FLEXERIL) 10 MG tablet Take 10 mg by mouth 2 (two) times daily as needed.   [name] dexlansoprazole (DEXILANT) 60 MG capsule Take 1 capsule (60 mg total) by mouth daily.   . DULoxetine (CYMBALTA) 60 MG capsule TAKE 1 CAPSULE(60 MG) BY MOUTH DAILY   . ezetimibe (ZETIA) 10 MG tablet Take 1 tablet (10 mg total) by mouth daily.   . Fluticasone-Umeclidin-Vilant (TRELEGY ELLIPTA) 100-62.5-25 MCG/INH AEPB Inhale 1 each into the lungs daily. In place of breo   . furosemide (LASIX) 20 MG tablet Take 1 tablet (20 mg total) by mouth daily as needed (for weight gain of 2 pounds overnight or 5 pounds in 1 week.).   [name] gabapentin (NEURONTIN) 300 MG capsule Take 1 capsule (300 mg total) by mouth 3 (three) times daily.   [name] gabapentin (NEURONTIN)  800 MG tablet Take 800 mg by mouth at bedtime.   . insulin lispro (HUMALOG) 100 UNIT/ML KiwkPen Inject 7-8 Units into the skin 3 (three) times daily. Reported on 12/02/2015/ sliding scale 1 unit for every 8 units of carbs; and 1 unit for every 20 above 120   . Insulin Pen Needle 32G X 4 MM MISC Inject 1 each into the skin as needed.   [name] LANTUS SOLOSTAR 100 UNIT/ML Solostar Pen Inject 25-30 Units into the skin daily at 10 pm.    . levocetirizine (XYZAL) 5 MG tablet TAKE 1 TABLET(5 MG) BY MOUTH EVERY EVENING   . levothyroxine (SYNTHROID) 50 MCG tablet Take 1 tablet (50 mcg total) by mouth daily before breakfast. One daily and two on Sundays - recheck level in 6 weeks   . liraglutide (VICTOZA) 18 MG/3ML SOPN 1.8 units every morning (Patient taking differently: Inject 1.8 mg into the skin daily.)   . loratadine (CLARITIN) 10 MG tablet Take 10 mg by mouth daily.    . metFORMIN (GLUCOPHAGE) 1000 MG tablet Take 1,000 mg by mouth 2 (two) times daily with a meal.   . metoprolol succinate (TOPROL-XL) 100 MG 24 hr tablet TAKE 1 TABLET(100 MG) BY MOUTH DAILY WITH OR IMMEDIATELY FOLLOWING A MEAL   . montelukast (SINGULAIR) 10 MG tablet Take 10 mg by mouth at bedtime.   . Multiple Vitamins-Minerals (MENS MULTI VITAMIN & MINERAL PO) Take 1 tablet by mouth  daily.   . naloxone (NARCAN) nasal spray 4 mg/0.1 mL For excess sedation from opioids 06/10/2018: PRN  . nitroGLYCERIN (NITROSTAT) 0.3 MG SL tablet Place 1 tablet (0.3 mg total) under the tongue every 5 (five) minutes as needed for chest pain. Maximum 3 doses.   . Oxycodone HCl 10 MG TABS Take 1 tablet (10 mg total) by mouth in the morning, at noon, in the evening, and at bedtime.   Derrill Memo ON 12/10/2020] Oxycodone HCl 10 MG TABS Take 1 tablet (10 mg total) by mouth in the morning, at noon, in the evening, and at bedtime.   . rosuvastatin (CRESTOR) 5 MG tablet Take 5 mg by mouth daily.   . Semaglutide,0.25 or 0.5MG /DOS, 2 MG/1.5ML SOPN Inject into the skin.   .  tamsulosin (FLOMAX) 0.4 MG CAPS capsule TAKE 2 CAPSULES BY MOUTH DAILY    No facility-administered encounter medications on file as of 12/03/2020.    Current Diagnosis: Patient Active Problem List   Diagnosis Date Noted  . Pain due to onychomycosis of toenails of both feet 11/11/2020  . Lung involvement associated with another disorder ([place]) 05/17/2020  . Left leg weakness 12/01/2019  . Left arm weakness 10/04/2019  . Lower urinary tract symptoms 09/15/2019  . Incomplete bladder emptying 09/15/2019  . Carpal tunnel syndrome on both sides 07/12/2019  . Weakness generalized 06/21/2019  . Idiopathic peripheral neuropathy 04/21/2019  . Sensory ataxia 03/09/2019  . Colitis 07/08/2018  . OSA on CPAP 07/08/2018  . Congestive heart failure (CHF) ([place]) 05/05/2018  . Unstable angina ([place]) 04/26/2018  . Ischemic cardiomyopathy 04/26/2018  . Chronic combined systolic and diastolic CHF (congestive heart failure) ([place]) 04/26/2018  . Effort angina ([place]) 04/25/2018  . 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[place]) 01/25/2018  . Hyperlipidemia due to type 2 diabetes mellitus ([place]) 08/12/2017  . Vitamin D deficiency, unspecified 08/12/2017  . Ptosis of left eyelid 01/08/2017  . Dermatitis 12/24/2016  . Difficulty walking 04/27/2016  . Foot cramps 04/27/2016  . Morbid (severe) obesity due to excess calories ([place]) 04/06/2016  . Benign neoplasm of sigmoid colon   . Benign neoplasm of descending colon   . Benign neoplasm of transverse colon   . Coronary artery disease involving native coronary artery with angina pectoris ([place]) 10/22/2015  . Coronary artery disease 08/22/2015  . DM (diabetes mellitus), type 2, uncontrolled with complications ([place]) 08/01/2535  . Myasthenia gravis ([place]) 08/22/2015  . Chronic left-sided low back pain 08/22/2015  . Hypertension 08/22/2015  . GERD (gastroesophageal reflux disease) 08/22/2015  . Arthritis 08/22/2015  . Diabetic peripheral neuropathy ([place])  08/22/2015  . Hyperlipidemia 08/22/2015  . Insomnia 08/22/2015  . Type 2 diabetes mellitus with diabetic polyneuropathy, with long-term current use of insulin ([place]) 08/22/2015  . Asthma 08/22/2015  . Lyme disease 06/05/1992    Goals Addressed   None      Please bring medications and supplements to appointment  Left Voice message to do initial question prior to patient appointment on 12/04/2020 for CCM at 2:00 pm  with [name] the Clinical pharmacist.   Follow-Up:  Pharmacist Review

## 2020-12-04 ENCOUNTER — Ambulatory Visit (INDEPENDENT_AMBULATORY_CARE_PROVIDER_SITE_OTHER): Payer: Medicare Other

## 2020-12-04 ENCOUNTER — Telehealth: Payer: Self-pay | Admitting: *Deleted

## 2020-12-04 DIAGNOSIS — E1142 Type 2 diabetes mellitus with diabetic polyneuropathy: Secondary | ICD-10-CM

## 2020-12-04 DIAGNOSIS — E1169 Type 2 diabetes mellitus with other specified complication: Secondary | ICD-10-CM | POA: Diagnosis not present

## 2020-12-04 DIAGNOSIS — Z794 Long term (current) use of insulin: Secondary | ICD-10-CM | POA: Diagnosis not present

## 2020-12-04 DIAGNOSIS — E785 Hyperlipidemia, unspecified: Secondary | ICD-10-CM

## 2020-12-04 DIAGNOSIS — I5042 Chronic combined systolic (congestive) and diastolic (congestive) heart failure: Secondary | ICD-10-CM | POA: Diagnosis not present

## 2020-12-04 NOTE — Telephone Encounter (Signed)
Reviewed results and recommendations with patient. Confirmed upcoming appointment and he verbalized understanding with no further questions at this time.

## 2020-12-04 NOTE — Telephone Encounter (Signed)
-----   Message from Loel Dubonnet, NP sent at 12/04/2020  7:41 AM EST ----- Echocardiogram shows LVEF 35-40% which is similar to previous study October 2019. This is reduced heart pumping function and we will discuss potential medication or lifestyle changes to increase heart pumping function at his upcoming follow up. No significant valvular abnormalities. Overall stable compared to previous.

## 2020-12-04 NOTE — Telephone Encounter (Signed)
Left voicemail message to call back for review of results and appointment on Monday with provider.

## 2020-12-09 ENCOUNTER — Ambulatory Visit: Payer: Medicare Other | Admitting: Family

## 2020-12-09 ENCOUNTER — Other Ambulatory Visit: Payer: Self-pay | Admitting: *Deleted

## 2020-12-09 ENCOUNTER — Encounter: Payer: Self-pay | Admitting: Family

## 2020-12-09 ENCOUNTER — Other Ambulatory Visit: Payer: Self-pay

## 2020-12-09 VITALS — BP 110/80 | HR 94 | Ht 67.0 in | Wt 241.0 lb

## 2020-12-09 DIAGNOSIS — E785 Hyperlipidemia, unspecified: Secondary | ICD-10-CM

## 2020-12-09 DIAGNOSIS — I10 Essential (primary) hypertension: Secondary | ICD-10-CM | POA: Diagnosis not present

## 2020-12-09 DIAGNOSIS — I951 Orthostatic hypotension: Secondary | ICD-10-CM

## 2020-12-09 DIAGNOSIS — I5022 Chronic systolic (congestive) heart failure: Secondary | ICD-10-CM

## 2020-12-09 DIAGNOSIS — I5042 Chronic combined systolic (congestive) and diastolic (congestive) heart failure: Secondary | ICD-10-CM

## 2020-12-09 DIAGNOSIS — I2510 Atherosclerotic heart disease of native coronary artery without angina pectoris: Secondary | ICD-10-CM | POA: Diagnosis not present

## 2020-12-09 MED ORDER — EMPAGLIFLOZIN 10 MG PO TABS: 10.0000 mg | ORAL_TABLET | Freq: Every day | ORAL | 2 refills | Status: DC

## 2020-12-09 NOTE — Progress Notes (Signed)
Office Visit    Patient Name: [name] Date of Encounter: 12/10/2020  Primary Care Provider:  [name], MD Primary Cardiologist:  [name], MD Electrophysiologist:  None   Chief Complaint    [name] is a 60 y.o. male with a hx of CAD, chronic systolic heart failure, DM2, HTN, HLD, myasthenia gravis, generalized pain due to reported Lyme disease and obesity presents today for follow up after cardiac testing.   Past Medical History    Past Medical History:  Diagnosis Date  . Allergy    dust, seasonal (worse in the fall).  . 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 Followed by Pain Specialist in [place], back and neck  . Asthma    BRONCHITIS  . Cataract    First Dx in 2012  . Chronic combined systolic and diastolic congestive heart failure ([place])    a. 03/2018 Echo: EF 30-35%, ant, antlat, apical AK, Gr1 DD; b. 07/2018 Echo: EF 35-40%, anteroseptal, apical, and ant HK. Gr1 DD; c. 09/2019 TEE: EF 40-45%.  . Coronary artery disease    a. Prior Ant MI->s/p multiple stents placed in the LAD and right coronary artery ([place]); b. 2016 Cath: reportedly nonobs dzs;  c. 04/2018 Cath/PCI: LM nl, LAD 20p, patent mid stent, LCX 60m(3.25x15 [place] DES), OM1 nl, OM2 50, OM3 40 w/ patent stent, RCA 40p, 20m, 40d w/ patent stent in RPDA, RPAV 60, EF 25-35%. 2+MR; d. 02/2019 MV: Apical scar, no isch, EF 30-44%.  . Diabetes mellitus without complication ([place])    TYPE 2  . Diabetic peripheral neuropathy (HCC)    feet and hands  . FUO (fever of unknown origin) 08/03/2018  . GERD (gastroesophageal reflux disease)   . Headache    muscle tension  . Hyperlipidemia   . Hypertension    CONTROLLED ON MEDS  . Insomnia   . Ischemic cardiomyopathy    a. 03/2018 Echo: EF 30-35%, ant, antlat, apical AK, Gr1 DD, mild MR, mildly dil LA; b. 07/2018 Echo: EF 35-40%; c. 09/2018 TEE: EF 40-45%, antsept, ant HK.  [name] Knee pain, acute 05/06/2020  . Left arm weakness 10/04/2019  . Left leg weakness  12/01/2019  . Lyme disease    Chronic  . Myasthenia gravis ([place])   . Myocardial infarction ([place]) 2010  . Seasonal allergies   . Sepsis ([place])    a.07/2018 - unknown source. TEE neg for veg 09/2019.  [name] Sleep apnea    CPAP   Past Surgical History:  Procedure Laterality Date  . BILATERAL CARPAL TUNNEL RELEASE Bilateral L in 2012 and R in 2013  . CARDIAC CATHETERIZATION     Several Caths, most recent in  March 2016.  [name] COLONOSCOPY WITH PROPOFOL N/A 01/10/2016   Procedure: COLONOSCOPY WITH PROPOFOL;  Surgeon: [name], MD;  Location: [facility] END[facility]COPY;  Service: Endoscopy;  Laterality: N/A;  . CORONARY ANGIOPLASTY    . CORONARY STENT INTERVENTION N/A 04/25/2018   Procedure: CORONARY STENT INTERVENTION;  Surgeon: [name], MD;  Location: [place];  Service: Cardiovascular;  Laterality: N/A;  . ESOPHAGOGASTRODUODENOSCOPY (EGD) WITH PROPOFOL N/A 01/10/2016   Procedure: ESOPHAGOGASTRODUODENOSCOPY (EGD) WITH PROPOFOL;  Surgeon: [name], MD;  Location: [facility] ENDOSCOPY;  Service: Endoscopy;  Laterality: N/A;  . EYE SURGERY Bilateral 2012   cataract/bilateral vitrectomies  . LEFT HEART CATH AND CORONARY ANGIOGRAPHY Left 04/25/2018   Procedure: LEFT HEART CATH AND CORONARY ANGIOGRAPHY;  Surgeon: [name], MD;  Location: [facility];  Service: Cardiovascular;  Laterality: Left;  . TEE WITHOUT CARDIOVERSION N/A 09/05/2018   Procedure: TRANSESOPHAGEAL ECHOCARDIOGRAM (TEE);  Surgeon: [name], MD;  Location: [facility];  Service: Cardiovascular;  Laterality: N/A;  . TONSILLECTOMY AND ADENOIDECTOMY     As a child  . TUNNELED VENOUS CATHETER PLACEMENT     removed    Allergies  Allergies  Allergen Reactions  . Azathioprine Other (See Comments)    Azathioprine hypersensitivity reaction  . Novolog [Insulin Aspart] Hives    History of Present Illness    [name] is a 60 y.o. male with a hx of CAD, chronic systolic heart failure, DM2, HTN, HLD,  myasthenia gravis, generalized pain due to reported Lyme disease and obesity last seen 09/17/20.   Extensive cardiac history with total of 10 stents (in LAD and RCA) starting in 2009 after presenting with MI. Worsening heart failure noted in June of 2019. Echo with LVEF 30%, akinesis of anterior, anterolateral, apical myocardium, mild MR. Cardiac cath 04/2018 with patent stent in LAD, OMs, and RCA. New LCx stenosis which was stented. EF 25-30% and LVEDP severely elevated. TEE 07/2018 in setting of sepsis with no evidence of endocarditis and EF 40-45%.   Entresto previously discontinue due to hypotension and dizziness. Due to persistent tachycardia his Carvedilol was transitioned to Metoprolol. When seen 01/2020 his metoprolol tartrate was changed to Toprol 100mg  daily. He was recommended for lower extremity arterial doppler to assess for PAD which were normal.   Seen in ED 07/19/20 after syncopal episode after coughing causing him to strike his head on edge of bathtub.  Covid swab unremarkable, urinalysis unremarkable with exception of glucose of 150.  CXR no acute findings.  CT cervical spine no acute fracture or static subluxation of cervical spine  Seen in follow-up 07/24/20.  No recurrent near syncope nor syncope. Orthostatic vital signs were positive with blood pressure lying 125/77 and standing 104/67.  He was recommended to increase p.o. fluid intake and follow-up with pulmonology regarding concern for respiratory infection. His syncope was thought to be due to vasovagal and ZIO monitoring was not recommended.  Seen in follow-up 09/17/2020.  Noted 3 falls since October.  Initial in setting of cough likely vasovagal, secondary to tripping, third getting lightheaded.  Orthostatic vitals were positive he was recommended to stop losartan, change Lasix to as needed.  Echo and ZIO were ordered.  ZIO monitor 10/25/2020 showed predominantly normal sinus rhythm with average heart rate of 80 bpm.  Events were  associated with sinus rhythm or sinus tach.  3 runs of SVT longest 5 beats with a rate of 120 bpm.  Echo 12/04/2019 LVEF 35-40%, wall motion normalities, grade 1 diastolic dysfunction, RV normal size and function, no significant valvular normalities.  He presents today for follow-up with his wife. Reviewed cardiac testing in detail.  Tells me he had 2 episodes since last seen of lightheadedness and feeling weak. Seems to occur with quick position changes. No near syncope nor syncope. No chest pain, pressure, tightness. Has not required his PRN Lasix and weight down 4 pounds. He is interested in [place] as it was suggested to him on a medicine review that it might be helpful. Most recent A1c 9.1 in June 2021 in setting of prednisone taper. He has been continued on Prednisone 5mg  daily which he tells me has improved his breathing. No orthopnea, PND, edema. He is staying active working in the yard.   EKGs/Labs/Other Studies Reviewed:   The following studies were  reviewed today:  Echo 12/03/20 1. Left ventricular ejection fraction, by estimation, is 35 to 40%. The  left ventricle has mildly decreased function. The left ventricle  demonstrates regional wall motion abnormalities (      Severe hypokinesis of the apical anterior, apical lateral, apical  inferior, and apical segments consistent with scar). Left ventricular  diastolic parameters are consistent with Grade I diastolic dysfunction  (impaired relaxation).   2. Right ventricular systolic function is normal. The right ventricular  size is normal. Tricuspid regurgitation signal is inadequate for assessing  PA pressure.   3. Challenging images   Long term monitor 10/25/20   Normal sinus rhythm with an average heart rate of 88 bpm. 3 episodes of SVT noted.  The longest lasted 5 beats with a rate of 128 bpm. Rare PACs and rare PVCs. Multiple reported symptoms by the patient did not correlate with arrhythmia.  Some events were associated with  sinus tachycardia  EKG: No EKG is ordered today.  The ekg independently reviewed from 07/24/2020 demonstrated NSR 90 bpm with RBBB and possible old inferior infarct and anterolateral infarct. No acute ST/T wave changes. Stable compared to previous EKG.  Recent Labs: 09/17/2020: ALT 20; Hemoglobin 10.9; Platelets 233; TSH 4.840 12/10/2020: BUN 27; Creatinine, Ser 1.21; Potassium 5.2; Sodium 135  Recent Lipid Panel    Component Value Date/Time   CHOL 104 09/17/2020 1117   TRIG 97 09/17/2020 1117   HDL 41 09/17/2020 1117   CHOLHDL 2.5 09/17/2020 1117   CHOLHDL 2.8 10/16/2019 1424   VLDL 26 11/29/2017 1154   LDLCALC 44 09/17/2020 1117   LDLCALC 34 10/16/2019 1424   Home Medications   Current Meds  Medication Sig  . albuterol (PROVENTIL HFA;VENTOLIN HFA) 108 (90 Base) MCG/ACT inhaler Inhale 2 puffs into the lungs every 6 (six) hours as needed for wheezing or shortness of breath.   [name] albuterol (PROVENTIL) (2.5 MG/3ML) 0.083% nebulizer solution Take 2.5 mg by nebulization every 6 (six) hours as needed for wheezing or shortness of breath.   [name] aspirin EC 81 MG tablet Take 1 tablet (81 mg total) by mouth daily.  . clopidogrel (PLAVIX) 75 MG tablet TAKE 1 TABLET(75 MG) BY MOUTH DAILY WITH BREAKFAST  . cyclobenzaprine (FLEXERIL) 10 MG tablet Take 10 mg by mouth 2 (two) times daily as needed.  [name] dexlansoprazole (DEXILANT) 60 MG capsule Take 1 capsule (60 mg total) by mouth daily.  . DULoxetine (CYMBALTA) 60 MG capsule TAKE 1 CAPSULE(60 MG) BY MOUTH DAILY  . empagliflozin (JARDIANCE) 10 MG TABS tablet Take 1 tablet (10 mg total) by mouth daily before breakfast.  . ezetimibe (ZETIA) 10 MG tablet Take 1 tablet (10 mg total) by mouth daily.  . Fluticasone-Umeclidin-Vilant (TRELEGY ELLIPTA) 100-62.5-25 MCG/INH AEPB Inhale 1 each into the lungs daily. In place of breo  . gabapentin (NEURONTIN) 300 MG capsule Take 1 capsule (300 mg total) by mouth 3 (three) times daily.  [name] gabapentin (NEURONTIN) 800 MG  tablet Take 800 mg by mouth at bedtime.  . insulin lispro (HUMALOG) 100 UNIT/ML KiwkPen Inject 7-8 Units into the skin 3 (three) times daily. Reported on 12/02/2015/ sliding scale 1 unit for every 7 units of carbs; and 1 unit for every 20 above 120  . Insulin Pen Needle 32G X 4 MM MISC Inject 1 each into the skin as needed.  [name] LANTUS SOLOSTAR 100 UNIT/ML Solostar Pen Inject 31-33 Units into the skin daily at 10 pm.  . levocetirizine (XYZAL) 5 MG tablet TAKE  1 TABLET(5 MG) BY MOUTH EVERY EVENING  . levothyroxine (SYNTHROID) 50 MCG tablet Take 1 tablet (50 mcg total) by mouth daily before breakfast. One daily and two on Sundays - recheck level in 6 weeks  . loratadine (CLARITIN) 10 MG tablet Take 10 mg by mouth daily.   . metFORMIN (GLUCOPHAGE) 1000 MG tablet Take 1,000 mg by mouth 2 (two) times daily with a meal.  . metoprolol succinate (TOPROL-XL) 100 MG 24 hr tablet TAKE 1 TABLET(100 MG) BY MOUTH DAILY WITH OR IMMEDIATELY FOLLOWING A MEAL  . montelukast (SINGULAIR) 10 MG tablet Take 10 mg by mouth at bedtime.  . Multiple Vitamins-Minerals (MENS MULTI VITAMIN & MINERAL PO) Take 1 tablet by mouth in the morning and at bedtime.  . naloxone (NARCAN) nasal spray 4 mg/0.1 mL For excess sedation from opioids  . nitroGLYCERIN (NITROSTAT) 0.3 MG SL tablet Place 1 tablet (0.3 mg total) under the tongue every 5 (five) minutes as needed for chest pain. Maximum 3 doses.  . Oxycodone HCl 10 MG TABS Take 1 tablet (10 mg total) by mouth in the morning, at noon, in the evening, and at bedtime.  . predniSONE (DELTASONE) 5 MG tablet Take 5 mg by mouth daily.  . rosuvastatin (CRESTOR) 5 MG tablet Take 5 mg by mouth daily.  . Semaglutide,0.25 or 0.5MG /DOS, 2 MG/1.5ML SOPN Inject 0.5 mg into the skin once a week.  . tamsulosin (FLOMAX) 0.4 MG CAPS capsule TAKE 2 CAPSULES BY MOUTH DAILY    Review of Systems  All other systems reviewed and are otherwise negative except as noted above.  Physical Exam   VS:  BP  110/80 (BP Location: Left Arm, Patient Position: Sitting, Cuff Size: Large)   Pulse 94   Ht 5\' 7"  (1.702 m)   Wt 241 lb (109.3 kg)   SpO2 95%   BMI 37.75 kg/m  , BMI Body mass index is 37.75 kg/m.  Wt Readings from Last 3 Encounters:  12/09/20 241 lb (109.3 kg)  09/17/20 245 lb (111.1 kg)  09/16/20 247 lb 12.8 oz (112.4 kg)    GEN: Well nourished, overweight, well developed, in no acute distress. HEENT: normal. Neck: Supple, no JVD, carotid bruits, or masses. Cardiac: RRR, no murmurs, rubs, or gallops. No clubbing, cyanosis, edema. Radials/PT 2+ and equal bilaterally.  Respiratory:  Respirations regular and unlabored.  Inspiratory wheeze to bilateral lower lobes. GI: Soft, nontender, nondistended. MS: No deformity or atrophy. Skin: Warm and dry, no rash. Neuro:  Strength and sensation are intact. Psych: Normal affect.  Assessment & Plan    1. Syncope / Fatigue / Orthostatic hypotension / Dizziness / Lightheadedness -ED visit 07/19/20 with syncope in the setting of coughing and vagal response.  Now with orthostatic hypotension and persistent lightheadedness. Improved since discontinuation of Losartan and transition to PRN Lasix. Recent ZIO with no significant arrhythmia and echo with stable LVEF 35-40% and no significant valvular abnormalities.  No evidence for further evaluation at this time symptoms have improved.  2. Anemia - 09/17/20 Hb 10.9. Goal <8 in setting of HFrEF.  Continue to follow with primary care provider.  3. CAD - No symptoms concerning for angina.  No indication for ischemic evaluation at this time.  GDMT includes aspirin, beta-blocker, statin.  4. 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  Previous intolerance to Entresto and carvedilol.  Losartan discontinued due to hypotension. Euvolemic and well compensated on exam. Compliant with low salt diet and fluid restriction. For optimization of GDMT and  concomitant benefit in DM2, start jardiance 10mg   daily.  BMP today.  Samples provided in clinic. Future considerations include addition of Spironolactone if BP, renal function, potassium will tolerate.  Referred to cardiac rehab in the setting of HFrEF with deconditioning.  Will route note to social work team for additional assistance with transportation.  5. HTN / Hypotension - BP improved since Losartan discontinued. Careful monitoring recommended.   6. HLD - 09/17/20 LDL 44.  Continue Zetia 10mg  daily, Rosuvastatin 5mg  daily.  7. DM2 - Continue to follow with PCP and endocrinology. Jardiance 10mg  daily added, as above.   8. OSA - CPAP compliance encouraged.  9. Asthma -continue to follow with Dr. Raul Del of pulmonology.  Inspiratory wheezes noted to bilateral lower lobes, stable compared to previous.  Prednisone 5 mg daily has been resumed by pulmonology.   Disposition: Follow up in 2 months with Dr. Fletcher Anon or APP  Signed, Loel Dubonnet, NP 12/10/2020, 6:49 PM [place]

## 2020-12-09 NOTE — Patient Instructions (Addendum)
Medication Instructions:  Your physician has recommended you make the following change in your medication:   START Jardiance 10mg  daily  *If you need a refill on your cardiac medications before your next appointment, please call your pharmacy*   Lab Work: Your provider recommends lab work today: BMP  If you have labs (blood work) drawn today and your tests are completely normal, you will receive your results only by: [name] MyChart Message (if you have MyChart) OR . A paper copy in the mail If you have any lab test that is abnormal or we need to change your treatment, we will call you to review the results.   Testing/Procedures: None ordered today.   Follow-Up: At [facility], you and your health needs are our priority.  As part of our continuing mission to provide you with exceptional heart care, we have created designated Provider Care Teams.  These Care Teams include your primary Cardiologist (physician) and Advanced Practice Providers (APPs -  Physician Assistants and Nurse Practitioners) who all work together to provide you with the care you need, when you need it.  We recommend signing up for the patient portal called "MyChart".  Sign up information is provided on this After Visit Summary.  MyChart is used to connect with patients for Virtual Visits (Telemedicine).  Patients are able to view lab/test results, encounter notes, upcoming appointments, etc.  Non-urgent messages can be sent to your provider as well.   To learn more about what you can do with MyChart, go to [email or web address].    Your next appointment:   2 month(s)  The format for your next appointment:   In Person  Provider:   You may see [name], MD or one of the following Advanced Practice Providers on your designated Care Team:    Murray Hodgkins, NP  Christell Faith, PA-C  Marrianne Mood, PA-C  Cadence [place], [place]  Laurann Montana, NP  Other Instructions   You have been referred to cardiac  rehab. This is a combination program including monitored exercise, dietary education, and support group. We strongly recommend participating in the program. Expect a phone call from them in approximately 2-3 weeks. If you do not hear from them, the phone number for cardiac rehab at [facility] is [phone].    We will have our Heart and Vascular Social Worker reach out to you    Samples of this drug were given to the patient Jardiance 10 mg, quantity 3 boxes, Lot Number [id number]  Exp  [phone]

## 2020-12-09 NOTE — Patient Instructions (Signed)
Visit Information It was great speaking with you today!  Please let me know if you have any questions about our visit.  Goals Addressed            This Visit's Progress   . Track and Manage Fluids and Swelling-Heart Failure       Timeframe:  Long-Range Goal Priority:  High Start Date:   12/05/2020                          Expected End Date: 06/07/2021                      Follow Up Date 02/01/2021    - call office if I gain more than 2 pounds in one day or 5 pounds in one week - keep legs up while sitting - use salt in moderation    Why is this important?    It is important to check your weight daily and watch how much salt and liquids you have.   It will help you to manage your heart failure.    Notes:        Patient Care Plan: General Pharmacy (Adult)    Problem Identified: Hypertension, Hyperlipidemia, Diabetes, Heart Failure, Coronary Artery Disease and Asthma   Priority: High    Long-Range Goal: Patient-Specific Goal   Start Date: 12/05/2020  Expected End Date: 06/11/2021  This Visit's Progress: On track  Priority: High  Note:   Current Barriers:  . Unable to independently afford treatment regimen  Pharmacist Clinical Goal(s):  [name] Over the next 90 days, patient will verbalize ability to afford treatment regimen through collaboration with PharmD and provider.   Interventions: . 1:1 collaboration with [name], MD regarding development and update of comprehensive plan of care as evidenced by provider attestation and co-signature . Inter-disciplinary care team collaboration (see longitudinal plan of care) . Comprehensive medication review performed; medication list updated in electronic medical record  Hyperlipidemia: (LDL goal < 70) -History of MI in 2010 -Controlled -Current treatment: . Ezetimibe 10 mg daily  . Rosuvastatin 5 mg daily  -Current antiplatelet treatment: . Aspirin 81 mg daily  . Clopidogrel 75 mg daily  -Medications previously tried: NA   -Cramping on higher doses of rosuvastatin  -Educated on Importance of limiting foods high in cholesterol; Exercise goal of 150 minutes per week; -Recommended to continue current medication  Diabetes (A1c goal <8%) -Controlled -Current medications: . Humalog 1 unit per 7g of carb, 1 unit for every 20 glucose above 120 three times daily  . Lantus 33 units daily  . Metformin 1000 mg twice daily  . Ozempic 0.5 mg weekly  -Medications previously tried: Victoza  -Current home glucose readings . fasting glucose: 85-120 -Denies hypoglycemic/hyperglycemic symptoms -Educated onPrevention and management of hypoglycemic episodes; Carbohydrate counting and/or plate method -Counseled to check feet daily and get yearly eye exams -Recommended Farxiga 5 mg daily for diabetes and HF hospitalization Assessed patient finances. Will start PAP for insulin, Ozempic   Heart Failure (Goal: control symptoms and prevent exacerbations) -Controlled Type: Systolic -NYHA Class: II (slight limitation of activity) -Ejection fraction: 35-40% (Date: 12/03/20) -Current treatment:  Furosemide 20 mg daily as needed  Metoprolol XL 100 mg daily  -Medications previously tried: bisprolol, HCTZ, carvedilol  -Current home BP/HR readings: NA -Current dietary habits: Following low carb, low sodium diet.  -Significant dizziness when taking furosemide.  -Educated on Importance of weighing daily; if you gain more than  3 pounds in one day or 5 pounds in one week, contact provider  -Recommended Farxiga 5 mg daily for diabetes and HF hospitalization  Asthma/COPD (Goal: control symptoms and prevent exacerbations) -Controlled -Current treatment  . Albuterol 2 puffs every 6 hours as needed  . Albuterol 3 mL nebulizer every 6 hours as needed  . Montelukast 10 mg nightly . Trelegy 1 puff daily   -Current allergic rhinitis treatment  . Levocetirizine 5 mg nightly  . Loratadine 10 mg daily   -Medications previously tried: NA   -Pulmonary function testing: NA -Exacerbations requiring treatment in last 6 months: yes, multiple steroid bursts  -Patient reports consistent use of maintenance inhaler -Frequency of rescue inhaler use: ~1-2 times weekly  -Counseled on Proper inhaler technique; -Recommended to continue current medication Assessed patient finances. Will start PAP for Trelegy  GERD (Goal: Prevent heartburn or reflux symptoms) -Controlled -Current treatment  . Dexlansoprazole 60 mg daily  -Medications previously tried: omeprazole (ineffective)   -Significant rebound symptoms if skips daily  -Recommended to continue current medication Assessed patient finances. Will start PAP for Dexilant   Chronic Pain (Goal: Maintain adequate pain relief) -Controlled -Current treatment  . Cyclobenzaprine 10 mg twice daily as needed  . Gabapentin 300 mg 1 capsule three times daily  . Gabapentin 800 mg nightly  . Oxycodone 10 mg four times daily  -Medications previously tried: NA -Counseled patient on risk of CNS depression, especially with combined use of oxycodone with gabapentin, cyclobenzaprine, or alcohol use.   -Recommended to continue current medication  Urinary Obstruction (Goal: maintain stable urine flow) -Controlled -Current treatment  . Tamsulosin 0.4 mg 2 capsules daily  -Medications previously tried: NA  -Much improved since starting tamsulosin. Could consider decreased to 0.4 mg daily if symptoms stable to minimize risk of dizziness, will address at future visit.  -Recommended to continue current medication  Depression/Anxiety (Goal: achieve stable mood) -Uncontrolled -Current treatment: . Duloxetine 60 mg daily  -Medications previously tried/failed: NA -PHQ9: 8 -GAD7: 5 -Connected with none for mental health support. Planning on establishing with behavioral health, but has not set up yet.  -Educated on Benefits of medication for symptom control Benefits of cognitive-behavioral therapy with  or without medication -Recommended to continue current medication   Patient Goals/Self-Care Activities . Over the next 90 days, patient will:  - check glucose at least 3 times daily , document, and provide at future appointments check blood pressure weekly , document, and provide at future appointments weigh daily, and contact provider if weight gain of greater than 3 pounds in one day  Follow Up Plan: Telephone follow up appointment with care management team member scheduled for: 03/06/2021 at 1:00 PM      [name] was given information about Chronic Care Management services today including:  1. CCM service includes personalized support from designated clinical staff supervised by his physician, including individualized plan of care and coordination with other care providers 2. 24/7 contact phone numbers for assistance for urgent and routine care needs. 3. Standard insurance, coinsurance, copays and deductibles apply for chronic care management only during months in which we provide at least 20 minutes of these services. Most insurances cover these services at 100%, however patients may be responsible for any copay, coinsurance and/or deductible if applicable. This service may help you avoid the need for more expensive face-to-face services. 4. Only one practitioner may furnish and bill the service in a calendar month. 5. The patient may stop CCM services at any time (effective  at the end of the month) by phone call to the office staff.  Patient agreed to services and verbal consent obtained.   The patient verbalized understanding of instructions, educational materials, and care plan provided today and declined offer to receive copy of patient instructions, educational materials, and care plan.   [facility] [phone]

## 2020-12-10 ENCOUNTER — Telehealth: Payer: Self-pay | Admitting: *Deleted

## 2020-12-10 ENCOUNTER — Encounter: Payer: Self-pay | Admitting: Family

## 2020-12-10 ENCOUNTER — Other Ambulatory Visit: Payer: Self-pay

## 2020-12-10 ENCOUNTER — Other Ambulatory Visit
Admission: RE | Admit: 2020-12-10 | Discharge: 2020-12-10 | Disposition: A | Discharge status: Home / Self Care | Payer: Medicare Other | Attending: Family | Admitting: Family

## 2020-12-10 ENCOUNTER — Other Ambulatory Visit: Payer: Self-pay | Admitting: *Deleted

## 2020-12-10 DIAGNOSIS — E875 Hyperkalemia: Secondary | ICD-10-CM

## 2020-12-10 DIAGNOSIS — I5042 Chronic combined systolic (congestive) and diastolic (congestive) heart failure: Secondary | ICD-10-CM | POA: Insufficient documentation

## 2020-12-10 DIAGNOSIS — I5022 Chronic systolic (congestive) heart failure: Secondary | ICD-10-CM

## 2020-12-10 LAB — BASIC METABOLIC PANEL
Anion gap: 6 (ref 5–15)
BUN/Creatinine Ratio: 21 (ref 10–24)
BUN: 24 mg/dL (ref 8–27)
BUN: 27 mg/dL — ABNORMAL HIGH (ref 8–23)
CO2: 22 mmol/L (ref 20–29)
CO2: 26 mmol/L (ref 22–32)
Calcium: 9.2 mg/dL (ref 8.6–10.2)
Calcium: 8.9 mg/dL (ref 8.9–10.3)
Chloride: 101 mmol/L (ref 96–106)
Chloride: 103 mmol/L (ref 98–111)
Creatinine, Ser: 1.15 mg/dL (ref 0.76–1.27)
Creatinine, Ser: 1.21 mg/dL (ref 0.61–1.24)
GFR, Estimated: >60 mL/min (ref >60)
Glucose, Bld: 193 mg/dL — ABNORMAL HIGH (ref 70–99)
Glucose: 206 mg/dL — ABNORMAL HIGH (ref 65–99)
Potassium: 5.8 mmol/L — ABNORMAL HIGH (ref 3.5–5.2)
Potassium: 5.2 mmol/L — ABNORMAL HIGH (ref 3.5–5.1)
Sodium: 135 mmol/L (ref 134–144)
Sodium: 135 mmol/L (ref 135–145)
eGFR: 72 mL/min/{1.73_m2} (ref >59)

## 2020-12-10 NOTE — Telephone Encounter (Signed)
Attempted to call pt to review lab results and provider's recc.  No answer. Unable to leave vm at this time.

## 2020-12-10 NOTE — Telephone Encounter (Signed)
-----   Message from Loel Dubonnet, NP sent at 12/10/2020 12:58 PM EST ----- Potassium improved compared to labwork yesterday. Now only mildly elevated at 5.2 with goal of 5.1 or below. Good result. Jardiace will actually help lower potassium. No changes at this time. Recommend repeat BMP at the [facility] in 1-2 weeks for monitoring at his convenience.

## 2020-12-11 ENCOUNTER — Telehealth: Payer: Self-pay | Admitting: Licensed Clinical Social Worker

## 2020-12-11 NOTE — Telephone Encounter (Signed)
CSW received referral to assist patient with transportation to cardiac rehab. CSW enrolled patient with [facility] and left message for patient to return call to provide follow up on transport services. CSW awaiting return call. Raquel Sarna, [place], [place]

## 2020-12-12 ENCOUNTER — Telehealth: Payer: Self-pay

## 2020-12-12 NOTE — Chronic Care Management (AMB) (Signed)
Chronic Care Management Pharmacy Assistant   Name: JERRIAN MELLS  MRN: [id number] DOB: [birth year]  Reason for Encounter: Patient Assistance Coordination  12/12/2020- Patient assistance forms filled out for Humalog Claiborne Rigg with [employer] Patient assistance program, Lantus Engineer, civil (consulting) with [employer] Patient assistance program, Ozempic with [employer] Patient assistance program all prescribed by Dr Mee Hives- Endocrinology. Trelegy Ellipta with [employer] Patient assistance program prescribed by Dr Ancil Boozer- PCP. Dexilant 60 mg with Bern[employer] Buff[employer] Patient assistance program prescribed by Dr Allen Norris- Gastroenterologist.  Patient called to inform about applications, he would like to have mailed to him. Patient aware that I will highlight and tag all areas of each application where he should sign and informed about income documentation. Patient will bring application for Trelegy back to Dr Ancil Boozer office for signature and for [facility] to fax. Patient will take Humalog, Lantus, Ozempic forms to Dr Honor Junes office at his 01/03/21 appointment and patient will schedule an appointment with Dr Allen Norris to have application for [place] signed. Placing forms in the mail  [name], CPP notified.  Medications: Outpatient Encounter Medications as of 12/12/2020  Medication Sig  . albuterol (PROVENTIL HFA;VENTOLIN HFA) 108 (90 Base) MCG/ACT inhaler Inhale 2 puffs into the lungs every 6 (six) hours as needed for wheezing or shortness of breath.   [name] albuterol (PROVENTIL) (2.5 MG/3ML) 0.083% nebulizer solution Take 2.5 mg by nebulization every 6 (six) hours as needed for wheezing or shortness of breath.   [name] aspirin EC 81 MG tablet Take 1 tablet (81 mg total) by mouth daily.  . clopidogrel (PLAVIX) 75 MG tablet TAKE 1 TABLET(75 MG) BY MOUTH DAILY WITH BREAKFAST  . cyclobenzaprine (FLEXERIL) 10 MG tablet Take 10 mg by mouth 2 (two) times daily as needed.  [name] dexlansoprazole (DEXILANT) 60 MG capsule Take 1 capsule (60 mg  total) by mouth daily.  . DULoxetine (CYMBALTA) 60 MG capsule TAKE 1 CAPSULE(60 MG) BY MOUTH DAILY  . empagliflozin (JARDIANCE) 10 MG TABS tablet Take 1 tablet (10 mg total) by mouth daily before breakfast.  . ezetimibe (ZETIA) 10 MG tablet Take 1 tablet (10 mg total) by mouth daily.  . Fluticasone-Umeclidin-Vilant (TRELEGY ELLIPTA) 100-62.5-25 MCG/INH AEPB Inhale 1 each into the lungs daily. In place of breo  . gabapentin (NEURONTIN) 300 MG capsule Take 1 capsule (300 mg total) by mouth 3 (three) times daily.  [name] gabapentin (NEURONTIN) 800 MG tablet Take 800 mg by mouth at bedtime.  . insulin lispro (HUMALOG) 100 UNIT/ML KiwkPen Inject 7-8 Units into the skin 3 (three) times daily. Reported on 12/02/2015/ sliding scale 1 unit for every 7 units of carbs; and 1 unit for every 20 above 120  . Insulin Pen Needle 32G X 4 MM MISC Inject 1 each into the skin as needed.  [name] LANTUS SOLOSTAR 100 UNIT/ML Solostar Pen Inject 31-33 Units into the skin daily at 10 pm.  . levocetirizine (XYZAL) 5 MG tablet TAKE 1 TABLET(5 MG) BY MOUTH EVERY EVENING  . levothyroxine (SYNTHROID) 50 MCG tablet Take 1 tablet (50 mcg total) by mouth daily before breakfast. One daily and two on Sundays - recheck level in 6 weeks  . loratadine (CLARITIN) 10 MG tablet Take 10 mg by mouth daily.   . metFORMIN (GLUCOPHAGE) 1000 MG tablet Take 1,000 mg by mouth 2 (two) times daily with a meal.  . metoprolol succinate (TOPROL-XL) 100 MG 24 hr tablet TAKE 1 TABLET(100 MG) BY MOUTH DAILY WITH OR IMMEDIATELY FOLLOWING A MEAL  . montelukast (SINGULAIR) 10 MG  tablet Take 10 mg by mouth at bedtime.  . Multiple Vitamins-Minerals (MENS MULTI VITAMIN & MINERAL PO) Take 1 tablet by mouth in the morning and at bedtime.  . naloxone (NARCAN) nasal spray 4 mg/0.1 mL For excess sedation from opioids  . nitroGLYCERIN (NITROSTAT) 0.3 MG SL tablet Place 1 tablet (0.3 mg total) under the tongue every 5 (five) minutes as needed for chest pain. Maximum 3 doses.   . Oxycodone HCl 10 MG TABS Take 1 tablet (10 mg total) by mouth in the morning, at noon, in the evening, and at bedtime.  . predniSONE (DELTASONE) 5 MG tablet Take 5 mg by mouth daily.  . rosuvastatin (CRESTOR) 5 MG tablet Take 5 mg by mouth daily.  . Semaglutide,0.25 or 0.5MG /DOS, 2 MG/1.5ML SOPN Inject 0.5 mg into the skin once a week.  . tamsulosin (FLOMAX) 0.4 MG CAPS capsule TAKE 2 CAPSULES BY MOUTH DAILY   No facility-administered encounter medications on file as of 12/12/2020.    Star Rating Drugs: Crestor 5 mg- Last filled 07/26/20 90 day supply [facility].  Metformin 1000 mg- Last filled 08/31/2020 90 day supply [facility]  SIG: Pattricia Boss, [place] Pharmacist Assistant [phone]

## 2020-12-13 ENCOUNTER — Telehealth: Payer: Self-pay | Admitting: Licensed Clinical Social Worker

## 2020-12-13 ENCOUNTER — Telehealth: Payer: Self-pay | Admitting: *Deleted

## 2020-12-13 DIAGNOSIS — E875 Hyperkalemia: Secondary | ICD-10-CM

## 2020-12-13 NOTE — Telephone Encounter (Signed)
-----   Message from Loel Dubonnet, NP sent at 12/10/2020 12:58 PM EST ----- Potassium improved compared to labwork yesterday. Now only mildly elevated at 5.2 with goal of 5.1 or below. Good result. Jardiace will actually help lower potassium. No changes at this time. Recommend repeat BMP at the [facility] in 1-2 weeks for monitoring at his convenience.

## 2020-12-13 NOTE — Telephone Encounter (Signed)
Attempted to call pt to review results and provider's recc.  Pt did not answer. Lmtcb on vm.  Also called pt's wife's number listed, Menelik Mcfarren, ok per DPR, and number invalid.

## 2020-12-13 NOTE — Telephone Encounter (Signed)
CSW attempted again to reach patient to discuss [facility] services. CSW left message for return call. Raquel Sarna, [place], [place]

## 2020-12-13 NOTE — Telephone Encounter (Signed)
Spoke to pt. Notified of lab results and provider's recc.  Pt verbalized understanding. [facility]  Lab orders placed. Pt has no further questions at this time

## 2020-12-13 NOTE — Telephone Encounter (Signed)
Spoke to pt. Notified of lab results and provider's recc.  Pt verbalized understanding. [facility] will [facility]  Lab orders placed. Pt has no further questions at this time.

## 2020-12-17 ENCOUNTER — Other Ambulatory Visit: Payer: Self-pay | Admitting: Family

## 2020-12-17 NOTE — Telephone Encounter (Signed)
Pt was previously prescribed Lasix 20mg  TAKE 1 TABLET BY MOUTH DAILY AS NEEDED( FOR WEIGHT GAIN OF 2 POUNDS OVERNIGHT OR 5 POUNDS IN 1 WEEK).   Per note 12/04/20 by Daron Offer, RPH - Lasix discontinued due to side effects: "significant dizziness when taking Furosemide." And med was d/c'd from med list.   Next follow up 02/13/21. Please advise regarding refill request.

## 2020-12-17 NOTE — Telephone Encounter (Signed)
Can you review note on Furosemide refill and let me know if this is okay to refill. Thanks!

## 2020-12-18 ENCOUNTER — Ambulatory Visit: Payer: Medicare Other

## 2020-12-25 ENCOUNTER — Other Ambulatory Visit
Admission: RE | Admit: 2020-12-25 | Discharge: 2020-12-25 | Disposition: A | Discharge status: Home / Self Care | Payer: Medicare Other | Attending: Family | Admitting: Family

## 2020-12-25 ENCOUNTER — Other Ambulatory Visit: Payer: Self-pay

## 2020-12-25 ENCOUNTER — Other Ambulatory Visit: Payer: Self-pay | Admitting: Anesthesiology

## 2020-12-25 DIAGNOSIS — E875 Hyperkalemia: Secondary | ICD-10-CM | POA: Insufficient documentation

## 2020-12-25 LAB — BASIC METABOLIC PANEL
Anion gap: 8 (ref 5–15)
BUN: 25 mg/dL — ABNORMAL HIGH (ref 8–23)
CO2: 26 mmol/L (ref 22–32)
Calcium: 9.2 mg/dL (ref 8.9–10.3)
Chloride: 104 mmol/L (ref 98–111)
Creatinine, Ser: 1.22 mg/dL (ref 0.61–1.24)
GFR, Estimated: >60 mL/min (ref >60)
Glucose, Bld: 243 mg/dL — ABNORMAL HIGH (ref 70–99)
Potassium: 4.9 mmol/L (ref 3.5–5.1)
Sodium: 138 mmol/L (ref 135–145)

## 2020-12-30 ENCOUNTER — Telehealth: Payer: Self-pay | Admitting: Anesthesiology

## 2020-12-30 NOTE — Telephone Encounter (Signed)
Patient has appt next week for med mgmt. He is out of cyclobenzaprine. Wants to know if this can be called in today please. Let patient know status.

## 2021-01-01 ENCOUNTER — Telehealth: Payer: Self-pay

## 2021-01-01 NOTE — Progress Notes (Signed)
Chronic Care Management Pharmacy Assistant   Name: [name]  MRN: [id number] DOB: 01/07/1961  Reason for Encounter: Medication Review   Medications: Outpatient Encounter Medications as of 01/01/2021  Medication Sig  . albuterol (PROVENTIL HFA;VENTOLIN HFA) 108 (90 Base) MCG/ACT inhaler Inhale 2 puffs into the lungs every 6 (six) hours as needed for wheezing or shortness of breath.   [name] albuterol (PROVENTIL) (2.5 MG/3ML) 0.083% nebulizer solution Take 2.5 mg by nebulization every 6 (six) hours as needed for wheezing or shortness of breath.   [name] aspirin EC 81 MG tablet Take 1 tablet (81 mg total) by mouth daily.  . clopidogrel (PLAVIX) 75 MG tablet TAKE 1 TABLET(75 MG) BY MOUTH DAILY WITH BREAKFAST  . cyclobenzaprine (FLEXERIL) 10 MG tablet Take 10 mg by mouth 2 (two) times daily as needed.  [name] dexlansoprazole (DEXILANT) 60 MG capsule Take 1 capsule (60 mg total) by mouth daily.  . DULoxetine (CYMBALTA) 60 MG capsule TAKE 1 CAPSULE(60 MG) BY MOUTH DAILY  . empagliflozin (JARDIANCE) 10 MG TABS tablet Take 1 tablet (10 mg total) by mouth daily before breakfast.  . ezetimibe (ZETIA) 10 MG tablet Take 1 tablet (10 mg total) by mouth daily.  . Fluticasone-Umeclidin-Vilant (TRELEGY ELLIPTA) 100-62.5-25 MCG/INH AEPB Inhale 1 each into the lungs daily. In place of breo  . furosemide (LASIX) 20 MG tablet TAKE 1 TABLET BY MOUTH DAILY AS NEEDED( FOR WEIGHT GAIN OF 2 POUNDS OVERNIGHT OR 5 POUNDS IN 1 WEEK)  . gabapentin (NEURONTIN) 300 MG capsule Take 1 capsule (300 mg total) by mouth 3 (three) times daily.  [name] gabapentin (NEURONTIN) 800 MG tablet Take 800 mg by mouth at bedtime.  . insulin lispro (HUMALOG) 100 UNIT/ML KiwkPen Inject 7-8 Units into the skin 3 (three) times daily. Reported on 12/02/2015/ sliding scale 1 unit for every 7 units of carbs; and 1 unit for every 20 above 120  . Insulin Pen Needle 32G X 4 MM MISC Inject 1 each into the skin as needed.  [name] LANTUS SOLOSTAR 100 UNIT/ML  Solostar Pen Inject 31-33 Units into the skin daily at 10 pm.  . levocetirizine (XYZAL) 5 MG tablet TAKE 1 TABLET(5 MG) BY MOUTH EVERY EVENING  . levothyroxine (SYNTHROID) 50 MCG tablet Take 1 tablet (50 mcg total) by mouth daily before breakfast. One daily and two on Sundays - recheck level in 6 weeks  . loratadine (CLARITIN) 10 MG tablet Take 10 mg by mouth daily.   . metFORMIN (GLUCOPHAGE) 1000 MG tablet Take 1,000 mg by mouth 2 (two) times daily with a meal.  . metoprolol succinate (TOPROL-XL) 100 MG 24 hr tablet TAKE 1 TABLET(100 MG) BY MOUTH DAILY WITH OR IMMEDIATELY FOLLOWING A MEAL  . montelukast (SINGULAIR) 10 MG tablet Take 10 mg by mouth at bedtime.  . Multiple Vitamins-Minerals (MENS MULTI VITAMIN & MINERAL PO) Take 1 tablet by mouth in the morning and at bedtime.  . naloxone (NARCAN) nasal spray 4 mg/0.1 mL For excess sedation from opioids  . nitroGLYCERIN (NITROSTAT) 0.3 MG SL tablet Place 1 tablet (0.3 mg total) under the tongue every 5 (five) minutes as needed for chest pain. Maximum 3 doses.  . Oxycodone HCl 10 MG TABS Take 1 tablet (10 mg total) by mouth in the morning, at noon, in the evening, and at bedtime.  . rosuvastatin (CRESTOR) 5 MG tablet Take 5 mg by mouth daily.  . Semaglutide,0.25 or 0.5MG /DOS, 2 MG/1.5ML SOPN Inject 0.5 mg into the skin once a week.  [name]  tamsulosin (FLOMAX) 0.4 MG CAPS capsule TAKE 2 CAPSULES BY MOUTH DAILY   No facility-administered encounter medications on file as of 01/01/2021.   Reviewed chart and adherence measures. Per insurance data patient is 80-89 % adherent to rosuvastatin and 90-99% adherent to losartan .   [place] Pharmacist Assistant [phone]

## 2021-01-06 ENCOUNTER — Other Ambulatory Visit: Payer: Self-pay

## 2021-01-06 ENCOUNTER — Ambulatory Visit: Payer: Medicare Other | Attending: Anesthesiology | Admitting: Anesthesiology

## 2021-01-06 DIAGNOSIS — F1190 Opioid use, unspecified, uncomplicated: Secondary | ICD-10-CM

## 2021-01-06 DIAGNOSIS — M51369 Other intervertebral disc degeneration, lumbar region without mention of lumbar back pain or lower extremity pain: Secondary | ICD-10-CM

## 2021-01-06 DIAGNOSIS — G894 Chronic pain syndrome: Secondary | ICD-10-CM | POA: Diagnosis not present

## 2021-01-06 DIAGNOSIS — M5432 Sciatica, left side: Secondary | ICD-10-CM | POA: Diagnosis not present

## 2021-01-06 DIAGNOSIS — M4716 Other spondylosis with myelopathy, lumbar region: Secondary | ICD-10-CM | POA: Diagnosis not present

## 2021-01-06 DIAGNOSIS — M542 Cervicalgia: Secondary | ICD-10-CM

## 2021-01-06 DIAGNOSIS — R29898 Other symptoms and signs involving the musculoskeletal system: Secondary | ICD-10-CM | POA: Diagnosis not present

## 2021-01-06 DIAGNOSIS — A6923 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: Secondary | ICD-10-CM

## 2021-01-06 DIAGNOSIS — M47817 Spondylosis without myelopathy or radiculopathy, lumbosacral region: Secondary | ICD-10-CM

## 2021-01-06 DIAGNOSIS — M5136 Other intervertebral disc degeneration, lumbar region: Secondary | ICD-10-CM | POA: Diagnosis not present

## 2021-01-07 ENCOUNTER — Encounter: Payer: Self-pay | Admitting: Anesthesiology

## 2021-01-07 ENCOUNTER — Other Ambulatory Visit: Payer: Self-pay

## 2021-01-07 ENCOUNTER — Encounter: Payer: Medicare Other | Attending: Cardiovascular Disease | Admitting: *Deleted

## 2021-01-07 DIAGNOSIS — I5022 Chronic systolic (congestive) heart failure: Secondary | ICD-10-CM

## 2021-01-07 MED ORDER — OXYCODONE HCL 10 MG PO TABS: 10.0000 mg | ORAL_TABLET | Freq: Four times a day (QID) | ORAL | 0 refills | Status: DC

## 2021-01-07 MED ORDER — OXYCODONE HCL 10 MG PO TABS
10.0000 mg | ORAL_TABLET | Freq: Four times a day (QID) | ORAL | 0 refills | Status: AC
Start: 2021-01-08 — End: 2021-02-07

## 2021-01-07 NOTE — Progress Notes (Signed)
Virtual orientation call completed today. he has an appointment on Date: 01/09/2021 for EP eval and gym Orientation.  Documentation of diagnosis [facility] found in [facility]  Date: 12/09/2020.

## 2021-01-07 NOTE — Progress Notes (Signed)
Virtual Visit via Telephone Note  I connected with [name] on 01/07/21 at  3:00 PM EDT by telephone and verified that I am speaking with the correct person using two identifiers.  Location: Patient: Home Provider: Pain control center   I discussed the limitations, risks, security and privacy concerns of performing an evaluation and management service by telephone and the availability of in person appointments. I also discussed with the patient that there may be a patient responsible charge related to this service. The patient expressed understanding and agreed to proceed.   History of Present Illness: I spoke with [name]  today via telephone as he was unable to link for the video portion of the virtual conference.  He reports that he is having quite a bit more left lower extremity sciatica.  No new weakness is reported but this is the typical pain that he gets that radiates from his lower back down the left posterior lateral leg.  He receives epidurals for this periodically and these work well for him.  They generally give him 100% relief lasting about a month before he gets gradual slow return of his left lower extremity pain over the course the next 3 months.  His most recent epidurals were in May and December 2021.  Otherwise he is doing well and taking his medications as prescribed and these continue to work well for him no difficulties are reported.  Otherwise he is in his usual state of health.  He continues to get good relief with the opioid medications and he rates this at 75 to 80% for his low back pain which is less responsive to the epidural injection.   Observations/Objective:  Current Outpatient Medications:  .  [START ON 02/07/2021] Oxycodone HCl 10 MG TABS, Take 1 tablet (10 mg total) by mouth in the morning, at noon, in the evening, and at bedtime., Disp: 120 tablet, Rfl: 0 .  albuterol (PROVENTIL HFA;VENTOLIN HFA) 108 (90 Base) MCG/ACT inhaler, Inhale 2 puffs into the lungs  every 6 (six) hours as needed for wheezing or shortness of breath. , Disp: , Rfl:  .  albuterol (PROVENTIL) (2.5 MG/3ML) 0.083% nebulizer solution, Take 2.5 mg by nebulization every 6 (six) hours as needed for wheezing or shortness of breath. , Disp: , Rfl:  .  aspirin EC 81 MG tablet, Take 1 tablet (81 mg total) by mouth daily., Disp: 90 tablet, Rfl: 3 .  clopidogrel (PLAVIX) 75 MG tablet, TAKE 1 TABLET(75 MG) BY MOUTH DAILY WITH BREAKFAST, Disp: 30 tablet, Rfl: 2 .  cyclobenzaprine (FLEXERIL) 10 MG tablet, Take 10 mg by mouth 2 (two) times daily as needed., Disp: , Rfl:  .  dexlansoprazole (DEXILANT) 60 MG capsule, Take 1 capsule (60 mg total) by mouth daily., Disp: 30 capsule, Rfl: 11 .  DULoxetine (CYMBALTA) 60 MG capsule, TAKE 1 CAPSULE(60 MG) BY MOUTH DAILY, Disp: 90 capsule, Rfl: 0 .  empagliflozin (JARDIANCE) 10 MG TABS tablet, Take 1 tablet (10 mg total) by mouth daily before breakfast., Disp: 30 tablet, Rfl: 2 .  ezetimibe (ZETIA) 10 MG tablet, Take 1 tablet (10 mg total) by mouth daily., Disp: 90 tablet, Rfl: 2 .  Fluticasone-Umeclidin-Vilant (TRELEGY ELLIPTA) 100-62.5-25 MCG/INH AEPB, Inhale 1 each into the lungs daily. In place of breo, Disp: 60 each, Rfl: 2 .  furosemide (LASIX) 20 MG tablet, TAKE 1 TABLET BY MOUTH DAILY AS NEEDED( FOR WEIGHT GAIN OF 2 POUNDS OVERNIGHT OR 5 POUNDS IN 1 WEEK), Disp: 90 tablet, Rfl: 0 .  gabapentin (NEURONTIN) 300 MG capsule, Take 1 capsule (300 mg total) by mouth 3 (three) times daily., Disp: 90 capsule, Rfl: 1 .  gabapentin (NEURONTIN) 800 MG tablet, Take 800 mg by mouth at bedtime., Disp: , Rfl:  .  insulin lispro (HUMALOG) 100 UNIT/ML KiwkPen, Inject 7-8 Units into the skin 3 (three) times daily. Reported on 12/02/2015/ sliding scale 1 unit for every 7 units of carbs; and 1 unit for every 20 above 120, Disp: , Rfl:  .  Insulin Pen Needle 32G X 4 MM MISC, Inject 1 each into the skin as needed., Disp: , Rfl:  .  LANTUS SOLOSTAR 100 UNIT/ML Solostar Pen,  Inject 31-33 Units into the skin daily at 10 pm., Disp: , Rfl: 0 .  levocetirizine (XYZAL) 5 MG tablet, TAKE 1 TABLET(5 MG) BY MOUTH EVERY EVENING, Disp: 30 tablet, Rfl: 3 .  levothyroxine (SYNTHROID) 50 MCG tablet, Take 1 tablet (50 mcg total) by mouth daily before breakfast. One daily and two on Sundays - recheck level in 6 weeks, Disp: 32 tablet, Rfl: 1 .  loratadine (CLARITIN) 10 MG tablet, Take 10 mg by mouth daily. , Disp: , Rfl:  .  metFORMIN (GLUCOPHAGE) 1000 MG tablet, Take 1,000 mg by mouth 2 (two) times daily with a meal., Disp: , Rfl:  .  metoprolol succinate (TOPROL-XL) 100 MG 24 hr tablet, TAKE 1 TABLET(100 MG) BY MOUTH DAILY WITH OR IMMEDIATELY FOLLOWING A MEAL, Disp: 90 tablet, Rfl: 3 .  montelukast (SINGULAIR) 10 MG tablet, Take 10 mg by mouth at bedtime., Disp: , Rfl:  .  Multiple Vitamins-Minerals (MENS MULTI VITAMIN & MINERAL PO), Take 1 tablet by mouth in the morning and at bedtime., Disp: , Rfl:  .  naloxone (NARCAN) nasal spray 4 mg/0.1 mL, For excess sedation from opioids, Disp: 1 kit, Rfl: 2 .  nitroGLYCERIN (NITROSTAT) 0.3 MG SL tablet, Place 1 tablet (0.3 mg total) under the tongue every 5 (five) minutes as needed for chest pain. Maximum 3 doses., Disp: 25 tablet, Rfl: 3 .  [START ON 01/08/2021] Oxycodone HCl 10 MG TABS, Take 1 tablet (10 mg total) by mouth in the morning, at noon, in the evening, and at bedtime., Disp: 120 tablet, Rfl: 0 .  rosuvastatin (CRESTOR) 5 MG tablet, Take 5 mg by mouth daily., Disp: , Rfl: 0 .  Semaglutide,0.25 or 0.5MG/DOS, 2 MG/1.5ML SOPN, Inject 0.5 mg into the skin once a week., Disp: , Rfl:  .  tamsulosin (FLOMAX) 0.4 MG CAPS capsule, TAKE 2 CAPSULES BY MOUTH DAILY, Disp: 60 capsule, Rfl: 2  Assessment and Plan:  1. Left leg weakness   2. Lumbar spondylosis with myelopathy   3. DDD (degenerative disc disease), lumbar   4. Chronic pain syndrome   5. Chronic, continuous use of opioids   6. Sciatica, left side   7. Cervicalgia   8. 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 Left arm weakness   10. Facet arthritis of lumbosacral region   Based on our discussion today and upon review of the [facility] practitioner database information going to refill his medications for April 6 and May 6.  No other changes will be made in his pain management protocol.  I also going to plan for an epidural steroid injection for him in approximately 2 to 3 weeks.  He understands the risks and benefits of the procedure.  He also has a sliding scale insulin to help with his sugar management following steroid administration.  I want  him to continue efforts at weight loss stretching strengthening with return to clinic as mentioned.  No other changes will be made in his pain management protocol at this point. Follow Up Instructions:    I discussed the assessment and treatment plan with the patient. The patient was provided an opportunity to ask questions and all were answered. The patient agreed with the plan and demonstrated an understanding of the instructions.   The patient was advised to call back or seek an in-person evaluation if the symptoms worsen or if the condition fails to improve as anticipated.  I provided 30 minutes of non-face-to-face time during this encounter.   Molli Barrows, MD

## 2021-01-08 ENCOUNTER — Telehealth: Payer: Self-pay | Admitting: Anesthesiology

## 2021-01-08 NOTE — Telephone Encounter (Signed)
Error on my part ....... its cyclobenzyprine

## 2021-01-08 NOTE — Telephone Encounter (Signed)
Dr. [name] has never prescribed Tizanidine for this patient. He has prescribed Flexeril. I cannot ask another physician to prescribe a medication that has not been prescribed before. Please call patient and clarify the medication so that I can call Dr. [name] for approval.

## 2021-01-08 NOTE — Telephone Encounter (Signed)
Dr. [name] did not call in Tizanidine/ please ask if one of the other phys can send this in for patient.

## 2021-01-09 NOTE — Telephone Encounter (Signed)
Refills called in after physician JA approval. Patient called and made aware.

## 2021-01-14 ENCOUNTER — Ambulatory Visit: Payer: Medicare Other

## 2021-01-14 ENCOUNTER — Telehealth: Payer: Self-pay

## 2021-01-14 ENCOUNTER — Other Ambulatory Visit: Payer: Self-pay | Admitting: Family

## 2021-01-14 NOTE — Progress Notes (Signed)
Chronic Care Management Pharmacy Assistant   Name: [name]  MRN: [id number] DOB: 1961/04/20  Reason for Encounter:Hypertension and Heart Failure Disease State Call.   Recent office visits:  No recent office visit  Recent consult visits:  01/06/2021 Anesthesiology Jeneen Rinks adams 4. 12/09/2020 Cardiology Laurann Montana start jardiance 10mg  daily  Hospital visits:  None in previous 6 months  Medications: Outpatient Encounter Medications as of 01/14/2021  Medication Sig  . albuterol (PROVENTIL HFA;VENTOLIN HFA) 108 (90 Base) MCG/ACT inhaler Inhale 2 puffs into the lungs every 6 (six) hours as needed for wheezing or shortness of breath.   [name] albuterol (PROVENTIL) (2.5 MG/3ML) 0.083% nebulizer solution Take 2.5 mg by nebulization every 6 (six) hours as needed for wheezing or shortness of breath.   [name] aspirin EC 81 MG tablet Take 1 tablet (81 mg total) by mouth daily.  . clopidogrel (PLAVIX) 75 MG tablet TAKE 1 TABLET(75 MG) BY MOUTH DAILY WITH BREAKFAST  . cyclobenzaprine (FLEXERIL) 10 MG tablet Take 10 mg by mouth 2 (two) times daily as needed.  [name] dexlansoprazole (DEXILANT) 60 MG capsule Take 1 capsule (60 mg total) by mouth daily.  . DULoxetine (CYMBALTA) 60 MG capsule TAKE 1 CAPSULE(60 MG) BY MOUTH DAILY  . empagliflozin (JARDIANCE) 10 MG TABS tablet Take 1 tablet (10 mg total) by mouth daily before breakfast.  . ezetimibe (ZETIA) 10 MG tablet Take 1 tablet (10 mg total) by mouth daily.  . Fluticasone-Umeclidin-Vilant (TRELEGY ELLIPTA) 100-62.5-25 MCG/INH AEPB Inhale 1 each into the lungs daily. In place of breo  . furosemide (LASIX) 20 MG tablet TAKE 1 TABLET BY MOUTH DAILY AS NEEDED( FOR WEIGHT GAIN OF 2 POUNDS OVERNIGHT OR 5 POUNDS IN 1 WEEK)  . gabapentin (NEURONTIN) 300 MG capsule Take 1 capsule (300 mg total) by mouth 3 (three) times daily.  [name] gabapentin (NEURONTIN) 800 MG tablet Take 800 mg by mouth at bedtime.  . insulin lispro (HUMALOG) 100 UNIT/ML KiwkPen Inject 7-8  Units into the skin 3 (three) times daily. Reported on 12/02/2015/ sliding scale 1 unit for every 7 units of carbs; and 1 unit for every 20 above 120  . Insulin Pen Needle 32G X 4 MM MISC Inject 1 each into the skin as needed.  [name] LANTUS SOLOSTAR 100 UNIT/ML Solostar Pen Inject 31-33 Units into the skin daily at 10 pm.  . levocetirizine (XYZAL) 5 MG tablet TAKE 1 TABLET(5 MG) BY MOUTH EVERY EVENING  . levothyroxine (SYNTHROID) 50 MCG tablet Take 1 tablet (50 mcg total) by mouth daily before breakfast. One daily and two on Sundays - recheck level in 6 weeks  . loratadine (CLARITIN) 10 MG tablet Take 10 mg by mouth daily.   . metFORMIN (GLUCOPHAGE) 1000 MG tablet Take 1,000 mg by mouth 2 (two) times daily with a meal.  . metoprolol succinate (TOPROL-XL) 100 MG 24 hr tablet TAKE 1 TABLET(100 MG) BY MOUTH DAILY WITH OR IMMEDIATELY FOLLOWING A MEAL  . montelukast (SINGULAIR) 10 MG tablet Take 10 mg by mouth at bedtime.  . Multiple Vitamins-Minerals (MENS MULTI VITAMIN & MINERAL PO) Take 1 tablet by mouth in the morning and at bedtime.  . naloxone (NARCAN) nasal spray 4 mg/0.1 mL For excess sedation from opioids  . nitroGLYCERIN (NITROSTAT) 0.3 MG SL tablet Place 1 tablet (0.3 mg total) under the tongue every 5 (five) minutes as needed for chest pain. Maximum 3 doses.  . Oxycodone HCl 10 MG TABS Take 1 tablet (10 mg total) by mouth in the  morning, at noon, in the evening, and at bedtime.  Derrill Memo ON 02/07/2021] Oxycodone HCl 10 MG TABS Take 1 tablet (10 mg total) by mouth in the morning, at noon, in the evening, and at bedtime.  . predniSONE (DELTASONE) 5 MG tablet Take by mouth.  . rosuvastatin (CRESTOR) 5 MG tablet Take 5 mg by mouth daily.  . Semaglutide,0.25 or 0.5MG /DOS, 2 MG/1.5ML SOPN Inject 0.5 mg into the skin once a week.  . tamsulosin (FLOMAX) 0.4 MG CAPS capsule TAKE 2 CAPSULES BY MOUTH DAILY   No facility-administered encounter medications on file as of 01/14/2021.   Star Rating  Drugs: Jardiance 10 mg last filled on 01/05/2021 for 30 day supply at [facility] Metformin 1000 mg last filled on 08/31/2020 for 90 day supply at [facility] Rosuvastatin 5 mg last filled on 07/26/2020 for 90 day supply at [facility] 0.5mg  last filled on 12/25/2020 for 28 mg supply at [place] chart prior to disease state call. Spoke with patient regarding BP  Recent Office Vitals: BP Readings from Last 3 Encounters:  12/09/20 110/80  09/17/20 108/75  09/16/20 136/84   Pulse Readings from Last 3 Encounters:  12/09/20 94  09/17/20 90  09/16/20 89    Wt Readings from Last 3 Encounters:  12/09/20 241 lb (109.3 kg)  09/17/20 245 lb (111.1 kg)  09/16/20 247 lb 12.8 oz (112.4 kg)     Kidney Function Lab Results  Component Value Date/Time   CREATININE 1.22 12/25/2020 11:35 AM   CREATININE 1.21 12/10/2020 10:28 AM   CREATININE 1.64 (H) 10/16/2019 02:24 PM   CREATININE 1.54 (H) 05/05/2019 10:12 AM   GFRNONAA >60 12/25/2020 11:35 AM   GFRNONAA 45 (L) 10/16/2019 02:24 PM   GFRAA 61 09/17/2020 11:17 AM   GFRAA 52 (L) 10/16/2019 02:24 PM    BMP Latest Ref Rng & Units 12/25/2020 12/10/2020 12/09/2020  Glucose 70 - 99 mg/dL 243(H) 193(H) 206(H)  BUN 8 - 23 mg/dL 25(H) 27(H) 24  Creatinine 0.61 - 1.24 mg/dL 1.22 1.21 1.15  BUN/Creat Ratio 10 - 24 - - 21  Sodium 135 - 145 mmol/L 138 135 135  Potassium 3.5 - 5.1 mmol/L 4.9 5.2(H) 5.8(H)  Chloride 98 - 111 mmol/L 104 103 101  CO2 22 - 32 mmol/L 26 26 22   Calcium 8.9 - 10.3 mg/dL 9.2 8.9 9.2    . Current antihypertensive regimen:   Furosemide 20 mg daily as needed(On Hold)  Metoprolol XL 100 mg daily   Patient reports dizziness and lightheadedness.Patient states he has a follow-up with his cardiologist to review medications and check his kidneys. . How often are you checking your Blood Pressure? daily . Current home BP readings:  o Patient states his blood pressure ranges around 117/65-85. [name] What recent  interventions/DTPs have been made by any provider to improve Blood Pressure control since last CPP Visit: None ID . Any recent hospitalizations or ED visits since last visit with CPP? No . What diet changes have been made to improve Blood Pressure Control?  o No Changes indicated . What exercise is being done to improve your Blood Pressure Control?  o Patient states he walks his dog daily. o Patient states he is in the process of joining the cardio rehab center that exercise twice a week.  Adherence Review: Is the patient currently on ACE/ARB medication? No Does the patient have >5 day gap between last estimated fill dates? No   Anderson Malta Clinical Production designer, theatre/television/film [phone]

## 2021-01-14 NOTE — Telephone Encounter (Signed)
Rx request sent to pharmacy.  

## 2021-01-15 ENCOUNTER — Ambulatory Visit: Payer: Medicare Other

## 2021-01-16 ENCOUNTER — Ambulatory Visit: Payer: Medicare Other | Admitting: Family Medicine

## 2021-01-29 DIAGNOSIS — Z794 Long term (current) use of insulin: Secondary | ICD-10-CM | POA: Diagnosis not present

## 2021-01-29 DIAGNOSIS — E1169 Type 2 diabetes mellitus with other specified complication: Secondary | ICD-10-CM | POA: Diagnosis not present

## 2021-01-29 DIAGNOSIS — E1159 Type 2 diabetes mellitus with other circulatory complications: Secondary | ICD-10-CM | POA: Diagnosis not present

## 2021-01-29 DIAGNOSIS — E1165 Type 2 diabetes mellitus with hyperglycemia: Secondary | ICD-10-CM | POA: Diagnosis not present

## 2021-01-29 DIAGNOSIS — I152 Hypertension secondary to endocrine disorders: Secondary | ICD-10-CM | POA: Diagnosis not present

## 2021-01-29 DIAGNOSIS — E785 Hyperlipidemia, unspecified: Secondary | ICD-10-CM | POA: Diagnosis not present

## 2021-01-29 LAB — HEMOGLOBIN A1C: Hemoglobin A1C: 7.9

## 2021-02-07 NOTE — Progress Notes (Deleted)
Name: [name]   MRN: [id number]    DOB: 1961-05-01   Date:02/07/2021       Progress Note  Subjective  Chief Complaint  Follow Up  HPI  Right knee pain:  He is doing better since seen by PT and wearing a knee brace, still uses a cane for balance and knee and back pain   Asthma : he is having sob and is seeing Dr. Raul Del, using Trelegy now and seems to be helping more than Anoro. He was also given low dose prednisone 5 mg daily for a period of time, but off medication for the past couple of months because he could not control his glucose. He has a lot of productive cough and cough at night , no wheezing. He has follow up with Dr. Raul Del   DMII:  he is under the care of Dr. Honor Junes , A1C today was 8.4 %, he states had two courses of steroids orally and also one steroid injection and his glucose goes up. He states he has been checking glucose with [employer] Libre, glucose in am's 72-110, however over the past week going up to 170's fasting. He has associated HTN, dyslipidemia, neuropathy.  He is on statin therapy, ARB and also gabapentin.   Hypothyroidism: he is currently on 25 mcg of levothyroxine, denies constipation, chronic dry skin this time of the year. He has stable dysphagia - going on for 30 years, he chews his food well.   Low back pain/ inflammatory spondylopathy/Cervical radiculitis : under the care of Dr. [name], he had a recent infection on his lumbar spine.   Morbid obesity: he has BMI above 35 with co-morbidities such as DM, dyslipidemia, OSA, CHF. Discussed avoiding surgery   Myasthenia Gravis: seeing Dr. Westley Foots, and per patient he was supposed to stopped Mestinon but the note states continue therapy. He will contact [facility] to find out what to do. He still has ptosis but states seems better than when taking medication  Atherosclerosis of aorta and CHF: he states sleeps with the head of the bed elevated because of his wife, not sure if he has orthopnea, he has SOB with  activity that is likely multifactorial, occasionally has lower extremity edema. He is being treated for angina but no recent episodes. Taking statin therapy, ARB, lasix, metoprolol, statin and zetia.    Patient Active Problem List   Diagnosis Date Noted  . Pain due to onychomycosis of toenails of both feet 11/11/2020  . Lung involvement associated with another disorder ([place]) 05/17/2020  . Left leg weakness 12/01/2019  . Left arm weakness 10/04/2019  . Lower urinary tract symptoms 09/15/2019  . Incomplete bladder emptying 09/15/2019  . Carpal tunnel syndrome on both sides 07/12/2019  . Weakness generalized 06/21/2019  . Idiopathic peripheral neuropathy 04/21/2019  . Sensory ataxia 03/09/2019  . Colitis 07/08/2018  . OSA on CPAP 07/08/2018  . Congestive heart failure (CHF) ([place]) 05/05/2018  . Unstable angina ([place]) 04/26/2018  . Ischemic cardiomyopathy 04/26/2018  . Chronic combined systolic and diastolic CHF (congestive heart failure) ([place]) 04/26/2018  . Effort angina ([place]) 04/25/2018  . 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[place]) 01/25/2018  . Hyperlipidemia due to type 2 diabetes mellitus ([place]) 08/12/2017  . Vitamin D deficiency, unspecified 08/12/2017  . Ptosis of left eyelid 01/08/2017  . Dermatitis 12/24/2016  . Difficulty walking 04/27/2016  . Foot cramps 04/27/2016  . Morbid (severe) obesity due to excess calories ([place]) 04/06/2016  . Benign neoplasm of sigmoid colon   .  Benign neoplasm of descending colon   . Benign neoplasm of transverse colon   . Coronary artery disease involving native coronary artery with angina pectoris ([place]) 10/22/2015  . Coronary artery disease 08/22/2015  . DM (diabetes mellitus), type 2, uncontrolled with complications ([place]) 35/32/9924  . Myasthenia gravis ([place]) 08/22/2015  . Chronic left-sided low back pain 08/22/2015  . Hypertension 08/22/2015  . GERD (gastroesophageal reflux disease) 08/22/2015  . Arthritis 08/22/2015  . Diabetic  peripheral neuropathy ([place]) 08/22/2015  . Hyperlipidemia 08/22/2015  . Insomnia 08/22/2015  . Type 2 diabetes mellitus with diabetic polyneuropathy, with long-term current use of insulin ([place]) 08/22/2015  . Asthma 08/22/2015  . Lyme disease 06/05/1992    Past Surgical History:  Procedure Laterality Date  . BILATERAL CARPAL TUNNEL RELEASE Bilateral L in 2012 and R in 2013  . CARDIAC CATHETERIZATION     Several Caths, most recent in  March 2016.  [name] COLONOSCOPY WITH PROPOFOL N/A 01/10/2016   Procedure: COLONOSCOPY WITH PROPOFOL;  Surgeon: [name], MD;  Location: [facility] ENDOSCOPY;  Service: Endoscopy;  Laterality: N/A;  . CORONARY ANGIOPLASTY    . CORONARY STENT INTERVENTION N/A 04/25/2018   Procedure: CORONARY STENT INTERVENTION;  Surgeon: [name], MD;  Location: [facility];  Service: Cardiovascular;  Laterality: N/A;  . ESOPHAGOGASTRODUODENOSCOPY (EGD) WITH PROPOFOL N/A 01/10/2016   Procedure: ESOPHAGOGASTRODUODENOSCOPY (EGD) WITH PROPOFOL;  Surgeon: [name], MD;  Location: [facility] ENDOSCOPY;  Service: Endoscopy;  Laterality: N/A;  . EYE SURGERY Bilateral 2012   cataract/bilateral vitrectomies  . LEFT HEART CATH AND CORONARY ANGIOGRAPHY Left 04/25/2018   Procedure: LEFT HEART CATH AND CORONARY ANGIOGRAPHY;  Surgeon: [name], MD;  Location: [facility];  Service: Cardiovascular;  Laterality: Left;  . TEE WITHOUT CARDIOVERSION N/A 09/05/2018   Procedure: TRANSESOPHAGEAL ECHOCARDIOGRAM (TEE);  Surgeon: [name], MD;  Location: [facility] OR[facility];  Service: Cardiovascular;  Laterality: N/A;  . TONSILLECTOMY AND ADENOIDECTOMY     As a child  . TUNNELED VENOUS CATHETER PLACEMENT     removed    Family History  Problem Relation Age of Onset  . Diabetes Mother   . Heart disease Mother   . Cancer Father        Prostate CA, Anal cancer   . Dementia Father   . Diabetes Brother   . Healthy Brother   . Healthy Brother     Social History   Tobacco Use   . Smoking status: Never Smoker  . Smokeless tobacco: Never Used  . Tobacco comment: smoking cessation materials not required  Substance Use Topics  . Alcohol use: Not Currently    Alcohol/week: 0.0 standard drinks     Current Outpatient Medications:  .  albuterol (PROVENTIL HFA;VENTOLIN HFA) 108 (90 Base) MCG/ACT inhaler, Inhale 2 puffs into the lungs every 6 (six) hours as needed for wheezing or shortness of breath. , Disp: , Rfl:  .  albuterol (PROVENTIL) (2.5 MG/3ML) 0.083% nebulizer solution, Take 2.5 mg by nebulization every 6 (six) hours as needed for wheezing or shortness of breath. , Disp: , Rfl:  .  aspirin EC 81 MG tablet, Take 1 tablet (81 mg total) by mouth daily., Disp: 90 tablet, Rfl: 3 .  clopidogrel (PLAVIX) 75 MG tablet, TAKE 1 TABLET(75 MG) BY MOUTH DAILY WITH BREAKFAST, Disp: 30 tablet, Rfl: 2 .  cyclobenzaprine (FLEXERIL) 10 MG tablet, Take 10 mg by mouth 2 (two) times daily as needed., Disp: , Rfl:  .  dexlansoprazole (DEXILANT) 60 MG  capsule, Take 1 capsule (60 mg total) by mouth daily., Disp: 30 capsule, Rfl: 11 .  DULoxetine (CYMBALTA) 60 MG capsule, TAKE 1 CAPSULE(60 MG) BY MOUTH DAILY, Disp: 90 capsule, Rfl: 0 .  empagliflozin (JARDIANCE) 10 MG TABS tablet, Take 1 tablet (10 mg total) by mouth daily before breakfast., Disp: 30 tablet, Rfl: 2 .  ezetimibe (ZETIA) 10 MG tablet, Take 1 tablet (10 mg total) by mouth daily., Disp: 90 tablet, Rfl: 2 .  Fluticasone-Umeclidin-Vilant (TRELEGY ELLIPTA) 100-62.5-25 MCG/INH AEPB, Inhale 1 each into the lungs daily. In place of breo, Disp: 60 each, Rfl: 2 .  furosemide (LASIX) 20 MG tablet, TAKE 1 TABLET BY MOUTH DAILY AS NEEDED( FOR WEIGHT GAIN OF 2 POUNDS OVERNIGHT OR 5 POUNDS IN 1 WEEK), Disp: 90 tablet, Rfl: 0 .  gabapentin (NEURONTIN) 300 MG capsule, Take 1 capsule (300 mg total) by mouth 3 (three) times daily., Disp: 90 capsule, Rfl: 1 .  gabapentin (NEURONTIN) 800 MG tablet, Take 800 mg by mouth at bedtime., Disp: , Rfl:   .  insulin lispro (HUMALOG) 100 UNIT/ML KiwkPen, Inject 7-8 Units into the skin 3 (three) times daily. Reported on 12/02/2015/ sliding scale 1 unit for every 7 units of carbs; and 1 unit for every 20 above 120, Disp: , Rfl:  .  Insulin Pen Needle 32G X 4 MM MISC, Inject 1 each into the skin as needed., Disp: , Rfl:  .  LANTUS SOLOSTAR 100 UNIT/ML Solostar Pen, Inject 31-33 Units into the skin daily at 10 pm., Disp: , Rfl: 0 .  levocetirizine (XYZAL) 5 MG tablet, TAKE 1 TABLET(5 MG) BY MOUTH EVERY EVENING, Disp: 30 tablet, Rfl: 3 .  levothyroxine (SYNTHROID) 50 MCG tablet, Take 1 tablet (50 mcg total) by mouth daily before breakfast. One daily and two on Sundays - recheck level in 6 weeks, Disp: 32 tablet, Rfl: 1 .  loratadine (CLARITIN) 10 MG tablet, Take 10 mg by mouth daily. , Disp: , Rfl:  .  metFORMIN (GLUCOPHAGE) 1000 MG tablet, Take 1,000 mg by mouth 2 (two) times daily with a meal., Disp: , Rfl:  .  metoprolol succinate (TOPROL-XL) 100 MG 24 hr tablet, TAKE 1 TABLET(100 MG) BY MOUTH DAILY WITH OR IMMEDIATELY FOLLOWING A MEAL, Disp: 90 tablet, Rfl: 3 .  montelukast (SINGULAIR) 10 MG tablet, Take 10 mg by mouth at bedtime., Disp: , Rfl:  .  Multiple Vitamins-Minerals (MENS MULTI VITAMIN & MINERAL PO), Take 1 tablet by mouth in the morning and at bedtime., Disp: , Rfl:  .  naloxone (NARCAN) nasal spray 4 mg/0.1 mL, For excess sedation from opioids, Disp: 1 kit, Rfl: 2 .  nitroGLYCERIN (NITROSTAT) 0.3 MG SL tablet, Place 1 tablet (0.3 mg total) under the tongue every 5 (five) minutes as needed for chest pain. Maximum 3 doses., Disp: 25 tablet, Rfl: 3 .  Oxycodone HCl 10 MG TABS, Take 1 tablet (10 mg total) by mouth in the morning, at noon, in the evening, and at bedtime., Disp: 120 tablet, Rfl: 0 .  Oxycodone HCl 10 MG TABS, Take 1 tablet (10 mg total) by mouth in the morning, at noon, in the evening, and at bedtime., Disp: 120 tablet, Rfl: 0 .  predniSONE (DELTASONE) 5 MG tablet, Take by mouth.,  Disp: , Rfl:  .  rosuvastatin (CRESTOR) 5 MG tablet, Take 5 mg by mouth daily., Disp: , Rfl: 0 .  Semaglutide,0.25 or 0.5MG/DOS, 2 MG/1.5ML SOPN, Inject 0.5 mg into the skin once a week., Disp: , Rfl:  .  tamsulosin (FLOMAX) 0.4 MG CAPS capsule, TAKE 2 CAPSULES BY MOUTH DAILY, Disp: 60 capsule, Rfl: 2  Allergies  Allergen Reactions  . Azathioprine Other (See Comments)    Azathioprine hypersensitivity reaction  . Novolog [Insulin Aspart] Hives    I personally reviewed {Reviewed:14835} with the patient/caregiver today.   ROS  ***  Objective  There were no vitals filed for this visit.  There is no height or weight on file to calculate BMI.  Physical Exam ***  Recent Results (from the past 2160 hour(s))  ECHOCARDIOGRAM COMPLETE     Status: None   Collection Time: 12/03/20  2:02 PM  Result Value Ref Range   S' Lateral 3.50 cm   Area-P 1/2 6.96 cm2  Basic metabolic panel     Status: Abnormal   Collection Time: 12/09/20  2:08 PM  Result Value Ref Range   Glucose 206 (H) 65 - 99 mg/dL   BUN 24 8 - 27 mg/dL   Creatinine, Ser 1.15 0.76 - 1.27 mg/dL   eGFR 72 >59 mL/min/1.73   BUN/Creatinine Ratio 21 10 - 24   Sodium 135 134 - 144 mmol/L   Potassium 5.8 (H) 3.5 - 5.2 mmol/L   Chloride 101 96 - 106 mmol/L   CO2 22 20 - 29 mmol/L   Calcium 9.2 8.6 - 10.2 mg/dL  Basic metabolic panel     Status: Abnormal   Collection Time: 12/10/20 10:28 AM  Result Value Ref Range   Sodium 135 135 - 145 mmol/L   Potassium 5.2 (H) 3.5 - 5.1 mmol/L   Chloride 103 98 - 111 mmol/L   CO2 26 22 - 32 mmol/L   Glucose, Bld 193 (H) 70 - 99 mg/dL    Comment: Glucose reference range applies only to samples taken after fasting for at least 8 hours.   BUN 27 (H) 8 - 23 mg/dL   Creatinine, Ser 1.21 0.61 - 1.24 mg/dL   Calcium 8.9 8.9 - 10.3 mg/dL   GFR, Estimated >60 >60 mL/min    Comment: (NOTE) Calculated using the CKD-EPI Creatinine Equation (2021)    Anion gap 6 5 - 15    Comment: Performed at  [facility], [address], [place], [place] [address]  Basic Metabolic Panel (BMET)     Status: Abnormal   Collection Time: 12/25/20 11:35 AM  Result Value Ref Range   Sodium 138 135 - 145 mmol/L   Potassium 4.9 3.5 - 5.1 mmol/L   Chloride 104 98 - 111 mmol/L   CO2 26 22 - 32 mmol/L   Glucose, Bld 243 (H) 70 - 99 mg/dL    Comment: Glucose reference range applies only to samples taken after fasting for at least 8 hours.   BUN 25 (H) 8 - 23 mg/dL   Creatinine, Ser 1.22 0.61 - 1.24 mg/dL   Calcium 9.2 8.9 - 10.3 mg/dL   GFR, Estimated >60 >60 mL/min    Comment: (NOTE) Calculated using the CKD-EPI Creatinine Equation (2021)    Anion gap 8 5 - 15    Comment: Performed at [facility], [address] [place], [place] [address]    Diabetic Foot Exam: Diabetic Foot Exam - Simple   No data filed    ***  PHQ2/9: Depression screen [facility] 2/9 09/16/2020 09/11/2020 07/23/2020 06/18/2020 05/17/2020  Decreased Interest 1 0 0 0 0  Down, Depressed, Hopeless 1 0 0 0 0  PHQ - 2 Score 2 0 0 0 0  Altered sleeping 1 - - -  0  Tired, decreased energy 1 - - - 0  Change in appetite 2 - - - 1  Feeling bad or failure about yourself  0 - - - 0  Trouble concentrating 1 - - - 0  Moving slowly or fidgety/restless 1 - - - 0  Suicidal thoughts 0 - - - 0  PHQ-9 Score 8 - - - 1  Difficult doing work/chores - - - - Not difficult at all  Some recent data might be hidden    phq 9 is {gen pos [id number]} ***  Fall Risk: Fall Risk  01/07/2021 09/16/2020 09/11/2020 07/23/2020 06/18/2020  Falls in the past year? _0 Comment - - - - -  Number falls in past yr: 0 0 0 0 0  Comment - - - - -  Injury with Fall? _1 0  Comment While sick throwing up and hit face on shower - went to ED - -  Risk Factor Category  - - - - -  Risk for fall due to : Impaired balance/gait - Other (Comment) Impaired balance/gait;History of fall(s);Orthopedic patient -  Risk for fall due to: Comment - - - -  -  Follow up Education provided;Falls prevention discussed - Falls prevention discussed Falls prevention discussed Falls evaluation completed   ***   Functional Status Survey:   ***   Assessment & Plan  *** There are no diagnoses linked to this encounter.

## 2021-02-10 ENCOUNTER — Ambulatory Visit: Payer: Medicare Other | Admitting: Family Medicine

## 2021-02-10 ENCOUNTER — Ambulatory Visit: Payer: Medicare Other | Admitting: Podiatry

## 2021-02-10 DIAGNOSIS — Z794 Long term (current) use of insulin: Secondary | ICD-10-CM

## 2021-02-10 DIAGNOSIS — Z1211 Encounter for screening for malignant neoplasm of colon: Secondary | ICD-10-CM

## 2021-02-11 ENCOUNTER — Telehealth: Payer: Self-pay

## 2021-02-11 NOTE — Progress Notes (Signed)
Chronic Care Management Pharmacy Assistant   Name: [name]  MRN: [id number] DOB: 12/22/1960  Reason for Encounter:Diabetes Disease State Call.   Recent office visits:  No recent Office Visit  Recent consult visits:  01/29/2021 Mee Hives MD  (Endocirnology)increase your ozempic to 1mg  once a week,Decrease Lantus to 20 if keep dropping over night, if running high increase to 23.    Hospital visits:  None in previous 6 months  Medications: Outpatient Encounter Medications as of 02/11/2021  Medication Sig  . albuterol (PROVENTIL HFA;VENTOLIN HFA) 108 (90 Base) MCG/ACT inhaler Inhale 2 puffs into the lungs every 6 (six) hours as needed for wheezing or shortness of breath.   [name] albuterol (PROVENTIL) (2.5 MG/3ML) 0.083% nebulizer solution Take 2.5 mg by nebulization every 6 (six) hours as needed for wheezing or shortness of breath.   [name] aspirin EC 81 MG tablet Take 1 tablet (81 mg total) by mouth daily.  . clopidogrel (PLAVIX) 75 MG tablet TAKE 1 TABLET(75 MG) BY MOUTH DAILY WITH BREAKFAST  . cyclobenzaprine (FLEXERIL) 10 MG tablet Take 10 mg by mouth 2 (two) times daily as needed.  [name] dexlansoprazole (DEXILANT) 60 MG capsule Take 1 capsule (60 mg total) by mouth daily.  . DULoxetine (CYMBALTA) 60 MG capsule TAKE 1 CAPSULE(60 MG) BY MOUTH DAILY  . empagliflozin (JARDIANCE) 10 MG TABS tablet Take 1 tablet (10 mg total) by mouth daily before breakfast.  . ezetimibe (ZETIA) 10 MG tablet Take 1 tablet (10 mg total) by mouth daily.  . Fluticasone-Umeclidin-Vilant (TRELEGY ELLIPTA) 100-62.5-25 MCG/INH AEPB Inhale 1 each into the lungs daily. In place of breo  . furosemide (LASIX) 20 MG tablet TAKE 1 TABLET BY MOUTH DAILY AS NEEDED( FOR WEIGHT GAIN OF 2 POUNDS OVERNIGHT OR 5 POUNDS IN 1 WEEK)  . gabapentin (NEURONTIN) 300 MG capsule Take 1 capsule (300 mg total) by mouth 3 (three) times daily.  [name] gabapentin (NEURONTIN) 800 MG tablet Take 800 mg by mouth at bedtime.  . insulin  lispro (HUMALOG) 100 UNIT/ML KiwkPen Inject 7-8 Units into the skin 3 (three) times daily. Reported on 12/02/2015/ sliding scale 1 unit for every 7 units of carbs; and 1 unit for every 20 above 120  . Insulin Pen Needle 32G X 4 MM MISC Inject 1 each into the skin as needed.  [name] LANTUS SOLOSTAR 100 UNIT/ML Solostar Pen Inject 31-33 Units into the skin daily at 10 pm.  . levocetirizine (XYZAL) 5 MG tablet TAKE 1 TABLET(5 MG) BY MOUTH EVERY EVENING  . levothyroxine (SYNTHROID) 50 MCG tablet Take 1 tablet (50 mcg total) by mouth daily before breakfast. One daily and two on Sundays - recheck level in 6 weeks  . loratadine (CLARITIN) 10 MG tablet Take 10 mg by mouth daily.   . metFORMIN (GLUCOPHAGE) 1000 MG tablet Take 1,000 mg by mouth 2 (two) times daily with a meal.  . metoprolol succinate (TOPROL-XL) 100 MG 24 hr tablet TAKE 1 TABLET(100 MG) BY MOUTH DAILY WITH OR IMMEDIATELY FOLLOWING A MEAL  . montelukast (SINGULAIR) 10 MG tablet Take 10 mg by mouth at bedtime.  . Multiple Vitamins-Minerals (MENS MULTI VITAMIN & MINERAL PO) Take 1 tablet by mouth in the morning and at bedtime.  . naloxone (NARCAN) nasal spray 4 mg/0.1 mL For excess sedation from opioids  . nitroGLYCERIN (NITROSTAT) 0.3 MG SL tablet Place 1 tablet (0.3 mg total) under the tongue every 5 (five) minutes as needed for chest pain. Maximum 3 doses.  . Oxycodone HCl  10 MG TABS Take 1 tablet (10 mg total) by mouth in the morning, at noon, in the evening, and at bedtime.  . predniSONE (DELTASONE) 5 MG tablet Take by mouth.  . rosuvastatin (CRESTOR) 5 MG tablet Take 5 mg by mouth daily.  . Semaglutide,0.25 or 0.5MG /DOS, 2 MG/1.5ML SOPN Inject 0.5 mg into the skin once a week.  . tamsulosin (FLOMAX) 0.4 MG CAPS capsule TAKE 2 CAPSULES BY MOUTH DAILY   No facility-administered encounter medications on file as of 02/11/2021.    Star Rating Drugs: Jardiance 10 mg last filled on 02/10/2021 for 30 day supply at [facility] Metformin 1000 mg last  filled on 01/14/2021 for 90 day supply at [facility] Rosuvastatin 5 mg last filled on 01/16/2021 for [facility] day [facility] 0.5mg  last filled on 01/29/2021 for 28 mg supply at [facility]: Lab Results  Component Value Date/Time   HGBA1C 8.4 (A) 09/16/2020 11:47 AM   HGBA1C 7.5 (H) 10/16/2019 02:24 PM   HGBA1C 7.9 (H) 05/05/2019 10:12 AM   HGBA1C 7.8 10/06/2018 12:00 AM   MICROALBUR 1.4 05/05/2019 10:12 AM   MICROALBUR 204 05/18/2018 12:00 AM   MICROALBUR 76.1 05/18/2018 12:00 AM   MICROALBUR Neg 01/25/2018 11:59 AM    Kidney Function Lab Results  Component Value Date/Time   CREATININE 1.22 12/25/2020 11:35 AM   CREATININE 1.21 12/10/2020 10:28 AM   CREATININE 1.64 (H) 10/16/2019 02:24 PM   CREATININE 1.54 (H) 05/05/2019 10:12 AM   GFRNONAA >60 12/25/2020 11:35 AM   GFRNONAA 45 (L) 10/16/2019 02:24 PM   GFRAA 61 09/17/2020 11:17 AM   GFRAA 52 (L) 10/16/2019 02:24 PM    . Current antihyperglycemic regimen:   Humalog 1 unit per 7g of carb, 1 unit for every 20 glucose above 120 three times daily   Lantus  20 unit  if keep dropping over night, if running high increase to 23.  Metformin 1000 mg twice daily   Ozempic 1 mg weekly  . What recent interventions/DTPs have been made to improve glycemic control:  o 01/29/2021 Mee Hives MD  (Endocirnology)increase your ozempic to 1mg  once a week,Decrease Lantus to 20 if keep dropping over night, if running high increase to 23. [name] Have there been any recent hospitalizations or ED visits since last visit with CPP? No   Adherence Review: Is the patient currently on a STATIN medication? Yes Is the patient currently on ACE/ARB medication? No Does the patient have >5 day gap between last estimated fill dates? No   [place] Pharmacist Assistant [phone]   I have attempted without success to contact this patient by phone three times to do his [place] call. I  left a Voice message for patient to return my call.  Left voice message on 05/11 ,05/12,05/16.

## 2021-02-13 ENCOUNTER — Other Ambulatory Visit: Payer: Self-pay | Admitting: Family Medicine

## 2021-02-13 ENCOUNTER — Ambulatory Visit: Payer: Medicare Other | Admitting: Podiatry

## 2021-02-13 ENCOUNTER — Ambulatory Visit: Payer: Medicare Other | Admitting: Cardiovascular Disease

## 2021-02-13 DIAGNOSIS — F331 Major depressive disorder, recurrent, moderate: Secondary | ICD-10-CM

## 2021-02-13 DIAGNOSIS — F411 Generalized anxiety disorder: Secondary | ICD-10-CM

## 2021-02-27 ENCOUNTER — Ambulatory Visit: Payer: Medicare Other | Admitting: Podiatry

## 2021-02-27 ENCOUNTER — Encounter: Payer: Self-pay | Admitting: Podiatry

## 2021-02-27 ENCOUNTER — Other Ambulatory Visit: Payer: Self-pay

## 2021-02-27 DIAGNOSIS — E1142 Type 2 diabetes mellitus with diabetic polyneuropathy: Secondary | ICD-10-CM | POA: Diagnosis not present

## 2021-02-27 DIAGNOSIS — B351 Tinea unguium: Secondary | ICD-10-CM | POA: Diagnosis not present

## 2021-02-27 DIAGNOSIS — M79674 Pain in right toe(s): Secondary | ICD-10-CM

## 2021-02-27 DIAGNOSIS — M79675 Pain in left toe(s): Secondary | ICD-10-CM | POA: Diagnosis not present

## 2021-02-27 NOTE — Progress Notes (Signed)
This patient presents  to my office for at risk foot care.  This patient requires this care by a professional since this patient will be at risk due to having diabetic neuropathy.  This patient is unable to cut nails himself since the patient cannot reach his nails.These nails are painful walking and wearing shoes.  This patient presents for at risk foot care today.  General Appearance  Alert, conversant and in no acute stress.  Vascular  Dorsalis pedis and posterior tibial  pulses are palpable  bilaterally.  Capillary return is within normal limits  bilaterally. Temperature is within normal limits  bilaterally.  Neurologic  Senn-Weinstein monofilament wire test absent   bilaterally. Muscle power within normal limits bilaterally.  Nails Thick disfigured discolored nails with subungual debris  Hallux nails bilaterally. No evidence of bacterial infection or drainage bilaterally.  Orthopedic  No limitations of motion  feet .  No crepitus or effusions noted.  No bony pathology or digital deformities noted.  Mild  MTA.  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  Skin   No porokeratosis noted bilaterally.  No signs of infections or ulcers noted.  Dry peeling skin plantarly both feet.   Onychomycosis Hallux    Pain in right toes  Pain in left toes  Diabetic neuropathy.  Consent was obtained for treatment procedures.   Mechanical debridement of nails 1-5  bilaterally performed with a nail nipper.  Filed with dremel without incident. Patient says he is experiencing pain in outside bone left foot from previous surgery.  Told him to see Dr.  Posey Pronto for evaluation left foot.   Return office visit   10 weeks                 Told patient to return for periodic foot care and evaluation due to potential at risk complications.   Gardiner Barefoot DPM

## 2021-03-04 ENCOUNTER — Other Ambulatory Visit: Payer: Self-pay

## 2021-03-04 ENCOUNTER — Encounter: Payer: Self-pay | Admitting: Family

## 2021-03-04 ENCOUNTER — Ambulatory Visit: Payer: Medicare Other | Admitting: Family

## 2021-03-04 VITALS — BP 102/78 | HR 112 | Ht 67.0 in | Wt 231.0 lb

## 2021-03-04 DIAGNOSIS — I10 Essential (primary) hypertension: Secondary | ICD-10-CM | POA: Diagnosis not present

## 2021-03-04 DIAGNOSIS — I25118 Atherosclerotic heart disease of native coronary artery with other forms of angina pectoris: Secondary | ICD-10-CM | POA: Diagnosis not present

## 2021-03-04 DIAGNOSIS — R5383 Other fatigue: Secondary | ICD-10-CM

## 2021-03-04 DIAGNOSIS — E785 Hyperlipidemia, unspecified: Secondary | ICD-10-CM

## 2021-03-04 DIAGNOSIS — I255 Ischemic cardiomyopathy: Secondary | ICD-10-CM

## 2021-03-04 MED ORDER — METOPROLOL SUCCINATE ER 100 MG PO TB24: ORAL_TABLET | ORAL | 2 refills | Status: DC

## 2021-03-04 NOTE — Progress Notes (Signed)
Office Visit    Patient Name: [name] Date of Encounter: 03/04/2021  Primary Care Provider:  [name], MD Primary Cardiologist:  [name], MD Electrophysiologist:  None   Chief Complaint    [name] is a 60 y.o. male with a hx of CAD, chronic systolic heart failure, DM2, HTN, HLD, myasthenia gravis, generalized pain due to reported Lyme disease and obesity presents today for follow up of heart failure and CAD  Past Medical History    Past Medical History:  Diagnosis Date  . Allergy    dust, seasonal (worse in the fall).  . 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 Followed by Pain Specialist in [place], back and neck  . Asthma    BRONCHITIS  . Cataract    First Dx in 2012  . Chronic combined systolic and diastolic congestive heart failure ([place])    a. 03/2018 Echo: EF 30-35%, ant, antlat, apical AK, Gr1 DD; b. 07/2018 Echo: EF 35-40%, anteroseptal, apical, and ant HK. Gr1 DD; c. 09/2019 TEE: EF 40-45%.  . Coronary artery disease    a. Prior Ant MI->s/p multiple stents placed in the LAD and right coronary artery ([place]); b. 2016 Cath: reportedly nonobs dzs;  c. 04/2018 Cath/PCI: LM nl, LAD 20p, patent mid stent, LCX 14m(3.25x15 [place] DES), OM1 nl, OM2 50, OM3 40 w/ patent stent, RCA 40p, 45m, 40d w/ patent stent in RPDA, RPAV 60, EF 25-35%. 2+MR; d. 02/2019 MV: Apical scar, no isch, EF 30-44%.  . Diabetes mellitus without complication ([place])    TYPE 2  . Diabetic peripheral neuropathy (HCC)    feet and hands  . FUO (fever of unknown origin) 08/03/2018  . GERD (gastroesophageal reflux disease)   . Headache    muscle tension  . Hyperlipidemia   . Hypertension    CONTROLLED ON MEDS  . Insomnia   . Ischemic cardiomyopathy    a. 03/2018 Echo: EF 30-35%, ant, antlat, apical AK, Gr1 DD, mild MR, mildly dil LA; b. 07/2018 Echo: EF 35-40%; c. 09/2018 TEE: EF 40-45%, antsept, ant HK.  [name] Knee pain, acute 05/06/2020  . Left arm weakness 10/04/2019  . Left leg weakness  12/01/2019  . Lyme disease    Chronic  . Myasthenia gravis ([place])   . Myocardial infarction ([place]) 2010  . Seasonal allergies   . Sepsis ([place])    a.07/2018 - unknown source. TEE neg for veg 09/2019.  [name] Sleep apnea    CPAP   Past Surgical History:  Procedure Laterality Date  . BILATERAL CARPAL TUNNEL RELEASE Bilateral L in 2012 and R in 2013  . CARDIAC CATHETERIZATION     Several Caths, most recent in  March 2016.  [name] COLONOSCOPY WITH PROPOFOL N/A 01/10/2016   Procedure: COLONOSCOPY WITH PROPOFOL;  Surgeon: [name], MD;  Location: [facility];  Service: Endoscopy;  Laterality: N/A;  . CORONARY ANGIOPLASTY    . CORONARY STENT INTERVENTION N/A 04/25/2018   Procedure: CORONARY STENT INTERVENTION;  Surgeon: [name], MD;  Location: [facility];  Service: Cardiovascular;  Laterality: N/A;  . ESOPHAGOGASTRODUODENOSCOPY (EGD) WITH PROPOFOL N/A 01/10/2016   Procedure: ESOPHAGOGASTRODUODENOSCOPY (EGD) WITH PROPOFOL;  Surgeon: [name], MD;  Location: [facility] END[facility]COPY;  Service: Endoscopy;  Laterality: N/A;  . EYE SURGERY Bilateral 2012   cataract/bilateral vitrectomies  . LEFT HEART CATH AND CORONARY ANGIOGRAPHY Left 04/25/2018   Procedure: LEFT HEART CATH AND CORONARY ANGIOGRAPHY;  Surgeon: [name], MD;  Location: [facility];  Service: Cardiovascular;  Laterality: Left;  . TEE WITHOUT CARDIOVERSION N/A 09/05/2018   Procedure: TRANSESOPHAGEAL ECHOCARDIOGRAM (TEE);  Surgeon: [name], MD;  Location: [facility];  Service: Cardiovascular;  Laterality: N/A;  . TONSILLECTOMY AND ADENOIDECTOMY     As a child  . TUNNELED VENOUS CATHETER PLACEMENT     removed    Allergies  Allergies  Allergen Reactions  . Azathioprine Other (See Comments)    Azathioprine hypersensitivity reaction  . Novolog [Insulin Aspart] Hives    History of Present Illness    [name] is a 60 y.o. male with a hx of CAD, chronic systolic heart failure, DM2, HTN, HLD,  myasthenia gravis, generalized pain due to reported Lyme disease and obesity last seen 12/09/20.   Extensive cardiac history with total of 10 stents (in LAD and RCA) starting in 2009 after presenting with MI. Worsening heart failure noted in June of 2019. Echo with LVEF 30%, akinesis of anterior, anterolateral, apical myocardium, mild MR. Cardiac cath 04/2018 with patent stent in LAD, OMs, and RCA. New LCx stenosis which was stented. EF 25-30% and LVEDP severely elevated. TEE 07/2018 in setting of sepsis with no evidence of endocarditis and EF 40-45%.   Entresto previously discontinue due to hypotension and dizziness. Due to persistent tachycardia his Carvedilol was transitioned to Metoprolol. When seen 01/2020 his metoprolol tartrate was changed to Toprol 100mg  daily. He was recommended for lower extremity arterial doppler to assess for PAD which were normal.   Seen in ED 07/19/20 after syncopal episode after coughing causing him to strike his head on edge of bathtub.  Covid swab unremarkable, urinalysis unremarkable with exception of glucose of 150.  CXR no acute findings.  CT cervical spine no acute fracture or static subluxation of cervical spine  Seen in follow-up 07/24/20.  No recurrent near syncope nor syncope. Orthostatic vital signs were positive with blood pressure lying 125/77 and standing 104/67.  He was recommended to increase p.o. fluid intake and follow-up with pulmonology regarding concern for respiratory infection. His syncope was thought to be due to vasovagal and ZIO monitoring was not recommended.  Seen in follow-up 09/17/2020.  Noted 3 falls since October.  Initial in setting of cough likely vasovagal, secondary to tripping, third getting lightheaded.  Orthostatic vitals were positive he was recommended to stop losartan, change Lasix to as needed.  Echo and ZIO were ordered.  ZIO monitor 10/25/2020 showed predominantly normal sinus rhythm with average heart rate of 80 bpm.  Events were  associated with sinus rhythm or sinus tach.  3 runs of SVT longest 5 beats with a rate of 120 bpm.  Echo 12/04/2019 LVEF 35-40%, wall motion normalities, grade 1 diastolic dysfunction, RV normal size and function, no significant valvular normalities. Seen in follow up 12/09/20 with weight down 4 pounds. Jardiance 10mg  daily was initiated for optimization of heart failure therapy.   He presents today for follow up with his wife. Tells me he feels better since starting Jardiance. He is down an additional 10 pounds over the last 3 months. Does note intermittent fatigue predominantly in the afternoons. He feels as if this is associated with days he does more activity such as working in the yard yesterday. We discussed keeping an activity log. Has only required PRN Lasix once or twice since last seen. Reports his lightheadedness remains well controlled. No chest pain, pressure, tightness. Home heart rate routinely in the 90s, did forget to take his Toprol this morning. Denies palpitations.  EKGs/Labs/Other Studies Reviewed:   The following studies were reviewed today:  Echo 12/03/20 1. Left ventricular ejection fraction, by estimation, is 35 to 40%. The  left ventricle has mildly decreased function. The left ventricle  demonstrates regional wall motion abnormalities (      Severe hypokinesis of the apical anterior, apical lateral, apical  inferior, and apical segments consistent with scar). Left ventricular  diastolic parameters are consistent with Grade I diastolic dysfunction  (impaired relaxation).   2. Right ventricular systolic function is normal. The right ventricular  size is normal. Tricuspid regurgitation signal is inadequate for assessing  PA pressure.   3. Challenging images   Long term monitor 10/25/20   Normal sinus rhythm with an average heart rate of 88 bpm. 3 episodes of SVT noted.  The longest lasted 5 beats with a rate of 128 bpm. Rare PACs and rare PVCs. Multiple reported symptoms  by the patient did not correlate with arrhythmia.  Some events were associated with sinus tachycardia  EKG: No EKG is ordered today.  The ekg independently reviewed from 07/24/2020 demonstrated NSR 90 bpm with RBBB and possible old inferior infarct and anterolateral infarct. No acute ST/T wave changes. Stable compared to previous EKG.  Recent Labs: 09/17/2020: ALT 20; Hemoglobin 10.9; Platelets 233; TSH 4.840 12/25/2020: BUN 25; Creatinine, Ser 1.22; Potassium 4.9; Sodium 138  Recent Lipid Panel    Component Value Date/Time   CHOL 104 09/17/2020 1117   TRIG 97 09/17/2020 1117   HDL 41 09/17/2020 1117   CHOLHDL 2.5 09/17/2020 1117   CHOLHDL 2.8 10/16/2019 1424   VLDL 26 11/29/2017 1154   LDLCALC 44 09/17/2020 1117   LDLCALC 34 10/16/2019 1424   Home Medications   No outpatient medications have been marked as taking for the 03/04/21 encounter (Appointment) with Loel Dubonnet, NP.    Review of Systems  All other systems reviewed and are otherwise negative except as noted above.  Physical Exam   VS:  There were no vitals taken for this visit. , BMI There is no height or weight on file to calculate BMI.  Wt Readings from Last 3 Encounters:  12/09/20 241 lb (109.3 kg)  09/17/20 245 lb (111.1 kg)  09/16/20 247 lb 12.8 oz (112.4 kg)    GEN: Well nourished, overweight, well developed, in no acute distress. HEENT: normal. Neck: Supple, no JVD, carotid bruits, or masses. Cardiac: RRR, no murmurs, rubs, or gallops. No clubbing, cyanosis, edema. Radials/PT 2+ and equal bilaterally.  Respiratory:  Respirations regular and unlabored.  GI: Soft, nontender, nondistended. MS: No deformity or atrophy. Skin: Warm and dry, no rash. Neuro:  Strength and sensation are intact. Psych: Normal affect.  Assessment & Plan   1. Syncope / Fatigue / Orthostatic hypotension / Dizziness / Lightheadedness -ED visit 07/19/20 with syncope in the setting of coughing and vagal response.  Orthostasis  improved since discontinuation of Losartan.  ZIO 10/2020 with no significant arrhythmia and echo with stable LVEF 35-40% and no significant valvular abnormalities.  No evidence for further evaluation of syncope at this time symptoms have improved. Anticipate fatigue likely due to HFrEF and deconditioning - BMP, mag, CBC to rule out alternate etiology.   2. Anemia - 09/17/20 Hb 10.9. CBC today for monitoring due to intermittent fatigue. Goal >8 in setting of HFrEF.  Continue to follow with primary care provider.  3. CAD - No symptoms concerning for angina.  No indication for ischemic evaluation at this time.  GDMT includes aspirin, beta-blocker,  statin.  4. 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  Previous intolerance to Entresto and carvedilol.  Losartan discontinued due to hypotension. Euvolemic and well compensated on exam. Compliant with low salt diet and fluid restriction. Tolerating Jardiance 10mg  daily. Weight down 10 lbs over last 3 months. Defer addition of MRA due to low normal BP. Previously referred to cardiac rehab, encouraged to participate. BMP, magnesium today for monitoring after addition of Jardiance. He was provided symptom log to evaluate fatigue.  5. HTN / Hypotension - Low normal BP with no significant lightheadedness. Losartan, Coreg, Entresto previously discontinued.    6. HLD - 09/17/20 LDL 44.  Continue Zetia 10mg  daily, Rosuvastatin 5mg  daily.  7. DM2 - 01/29/21 A1c 7.9. Continue to follow with PCP.  8. OSA - CPAP compliance encouraged.  9. Hypothyroidism - Continue to follow with PCP.   10. Asthma -continue to follow with Dr. Raul Del of pulmonology.   Disposition: Follow up in 3 months with Dr. Fletcher Anon or APP  Signed, Loel Dubonnet, NP 03/04/2021, 1:12 PM [place]

## 2021-03-04 NOTE — Patient Instructions (Addendum)
Medication Instructions:  Continue your current medications.   *If you need a refill on your cardiac medications before your next appointment, please call your pharmacy*   Lab Work: Your provider recommends  lab work today: CBC, BMP, magnesium -  Please go to the [facility]. You will check in at the front desk to the right as you walk into the atrium. Valet Parking is offered if needed.   If you have labs (blood work) drawn today and your tests are completely normal, you will receive your results only by: [name] MyChart Message (if you have MyChart) OR . A paper copy in the mail If you have any lab test that is abnormal or we need to change your treatment, we will call you to review the results.   Testing/Procedures: None ordered today.    Follow-Up: At [facility], you and your health needs are our priority.  As part of our continuing mission to provide you with exceptional heart care, we have created designated Provider Care Teams.  These Care Teams include your primary Cardiologist (physician) and Advanced Practice Providers (APPs -  Physician Assistants and Nurse Practitioners) who all work together to provide you with the care you need, when you need it.  We recommend signing up for the patient portal called "MyChart".  Sign up information is provided on this After Visit Summary.  MyChart is used to connect with patients for Virtual Visits (Telemedicine).  Patients are able to view lab/test results, encounter notes, upcoming appointments, etc.  Non-urgent messages can be sent to your provider as well.   To learn more about what you can do with MyChart, go to [email or web address].    Your next appointment:   3 month(s)  The format for your next appointment:   In Person  Provider:   You may see [name], MD or one of the following Advanced Practice Providers on your designated Care Team:    Murray Hodgkins, NP  Christell Faith, PA-C  Marrianne Mood,  PA-C  Cadence Kathlen Mody, [place]  Laurann Montana, NP  Other Instructions  Heart Healthy Diet Recommendations: A low-salt diet is recommended. Meats should be grilled, baked, or boiled. Avoid fried foods. Focus on lean protein sources like fish or chicken with vegetables and fruits. The American Heart Association is a Microbiologist!  American Heart Association Diet and Lifeystyle Recommendations   Exercise recommendations: The American Heart Association recommends 150 minutes of moderate intensity exercise weekly. Try 30 minutes of moderate intensity exercise 4-5 times per week. This could include walking, jogging, or swimming.

## 2021-03-05 ENCOUNTER — Telehealth: Payer: Self-pay

## 2021-03-05 NOTE — Progress Notes (Signed)
Left voice message to confirmed patient telephone appointment on 03/06/2021 for CCM at 1:00 pm with [name] the Clinical pharmacist.  Left message to have all medications, supplements, blood pressure and/or blood sugar logs available during appointment and to return call if need to reschedule.  [place] Pharmacist Assistant [phone]

## 2021-03-06 ENCOUNTER — Encounter: Payer: Self-pay | Admitting: Gastroenterology

## 2021-03-06 ENCOUNTER — Ambulatory Visit: Payer: Medicare Other | Admitting: Gastroenterology

## 2021-03-06 ENCOUNTER — Other Ambulatory Visit: Payer: Self-pay

## 2021-03-06 ENCOUNTER — Ambulatory Visit: Payer: Medicare Other | Attending: Anesthesiology | Admitting: Anesthesiology

## 2021-03-06 ENCOUNTER — Ambulatory Visit (INDEPENDENT_AMBULATORY_CARE_PROVIDER_SITE_OTHER): Payer: Medicare Other

## 2021-03-06 VITALS — BP 94/60 | HR 91 | Ht 67.0 in | Wt 233.8 lb

## 2021-03-06 DIAGNOSIS — Z794 Long term (current) use of insulin: Secondary | ICD-10-CM | POA: Diagnosis not present

## 2021-03-06 DIAGNOSIS — E785 Hyperlipidemia, unspecified: Secondary | ICD-10-CM

## 2021-03-06 DIAGNOSIS — M5136 Other intervertebral disc degeneration, lumbar region: Secondary | ICD-10-CM

## 2021-03-06 DIAGNOSIS — G894 Chronic pain syndrome: Secondary | ICD-10-CM

## 2021-03-06 DIAGNOSIS — M47817 Spondylosis without myelopathy or radiculopathy, lumbosacral region: Secondary | ICD-10-CM | POA: Diagnosis not present

## 2021-03-06 DIAGNOSIS — E1169 Type 2 diabetes mellitus with other specified complication: Secondary | ICD-10-CM | POA: Diagnosis not present

## 2021-03-06 DIAGNOSIS — M4716 Other spondylosis with myelopathy, lumbar region: Secondary | ICD-10-CM | POA: Diagnosis not present

## 2021-03-06 DIAGNOSIS — F1190 Opioid use, unspecified, uncomplicated: Secondary | ICD-10-CM

## 2021-03-06 DIAGNOSIS — E1142 Type 2 diabetes mellitus with diabetic polyneuropathy: Secondary | ICD-10-CM | POA: Diagnosis not present

## 2021-03-06 DIAGNOSIS — M542 Cervicalgia: Secondary | ICD-10-CM | POA: Diagnosis not present

## 2021-03-06 DIAGNOSIS — M5432 Sciatica, left side: Secondary | ICD-10-CM | POA: Diagnosis not present

## 2021-03-06 DIAGNOSIS — Z86010 Personal history of colonic polyps: Secondary | ICD-10-CM

## 2021-03-06 DIAGNOSIS — R29898 Other symptoms and signs involving the musculoskeletal system: Secondary | ICD-10-CM

## 2021-03-06 DIAGNOSIS — I5042 Chronic combined systolic (congestive) and diastolic (congestive) heart failure: Secondary | ICD-10-CM | POA: Diagnosis not present

## 2021-03-06 MED ORDER — OXYCODONE HCL 10 MG PO TABS: 10.0000 mg | ORAL_TABLET | Freq: Four times a day (QID) | ORAL | 0 refills | Status: DC

## 2021-03-06 MED ORDER — OXYCODONE HCL 10 MG PO TABS: 10.0000 mg | ORAL_TABLET | Freq: Four times a day (QID) | ORAL | 0 refills | Status: AC

## 2021-03-06 MED ORDER — DEXLANSOPRAZOLE 60 MG PO CPDR: 1.0000 | DELAYED_RELEASE_CAPSULE | Freq: Every day | ORAL | 11 refills | Status: DC

## 2021-03-06 MED ORDER — SUPREP BOWEL PREP KIT 17.5-3.13-1.6 GM/177ML PO SOLN: 1.0000 | ORAL | 0 refills | Status: DC

## 2021-03-06 NOTE — Progress Notes (Signed)
Chronic Care Management Pharmacy Note  03/10/2021 Name:  [name] MRN:  [id number] DOB:  03-21-1961  Summary: Patient reports he is doing well, although he has been suffering from significant chest congestion. He has had multiple medication changes since last visit, which he reports tolerating well. He has lost 10 pounds since last march and reports his appetite continues to be quite variable. He is also under significant stress due to his family situation, but is working with family counseling to resolve it.   We are working on patient assistance for multiple medications which the patient faxed in prior to receiving the doctors' prescriptions.   Recommendations/Changes made from today's visit: Continue Current Medications  Plan: Will send prescription portion of patient assistance applications to provider's offices for signature and submission.   Subjective: [name] is an 60 y.o. year old male who is a primary patient of Edgar Sizer, MD.  The CCM team was consulted for assistance with disease management and care coordination needs.    Engaged with patient by telephone for follow up visit in response to provider referral for pharmacy case management and/or care coordination services.   Consent to Services:  The patient was given information about Chronic Care Management services, agreed to services, and gave verbal consent prior to initiation of services.  Please see initial visit note for detailed documentation.   Patient Care Team: Edgar Sizer, MD as PCP - General (Family Medicine) [name], MD as PCP - Cardiology (Cardiology) Erby Pian, MD as Consulting Physician (Pulmonary Disease) Molli Barrows, MD as Consulting Physician (Anesthesiology) Lonia Farber, MD as Consulting Physician (Endocrinology) Cleotilde Neer, MD as Referring Physician (Neurology) [name], MD as Consulting Physician (Gastroenterology) Germaine Pomfret, [facility] (Pharmacist)  Recent office visits: 09/16/20: Patient presented to Dr. Ancil Boozer for follow-up. A1c 8.4%. Pyridostigmine stopped.  Recent consult visits: 03/04/21: Patient presented to Laurann Montana, NP (Cardiology) for follow-up.  01/29/21: Patient presented to Dr. Honor Junes (Endocrinology) for follow-up. Ozempic incresed to 1 mg weekly, Lantus decreased to 20.  12/09/20: Patient presented to Dr. Gilford Rile (Cardiology) for follow-up. Patient started on Jardiance 10 mg daily.  11/15/20: Patient presented to Dr. Raul Del (Pulmonology) for cough.  09/24/20: Patient presented to Dr. Honor Junes (Endocrinology) for follow-up. Humalog increased. Levothyroxine increased to 50 mcg daily.   Hospital visits: None in previous 6 months   Objective:  Lab Results  Component Value Date   CREATININE 1.22 12/25/2020   BUN 25 (H) 12/25/2020   GFRNONAA >60 12/25/2020   GFRAA 61 09/17/2020   NA 138 12/25/2020   K 4.9 12/25/2020   CALCIUM 9.2 12/25/2020   CO2 26 12/25/2020   GLUCOSE 243 (H) 12/25/2020    Lab Results  Component Value Date/Time   HGBA1C 8.4 (A) 09/16/2020 11:47 AM   HGBA1C 7.5 (H) 10/16/2019 02:24 PM   HGBA1C 7.9 (H) 05/05/2019 10:12 AM   HGBA1C 7.8 10/06/2018 12:00 AM   MICROALBUR 1.4 05/05/2019 10:12 AM   MICROALBUR 204 05/18/2018 12:00 AM   MICROALBUR 76.1 05/18/2018 12:00 AM   MICROALBUR Neg 01/25/2018 11:59 AM    Last diabetic Eye exam:  Lab Results  Component Value Date/Time   HMDIABEYEEXA Retinopathy (A) 08/02/2019 12:00 AM    Last diabetic Foot exam: No results found for: HMDIABFOOTEX   Lab Results  Component Value Date   CHOL 104 09/17/2020   HDL 41 09/17/2020   LDLCALC 44 09/17/2020   TRIG 97 09/17/2020   CHOLHDL 2.5 09/17/2020  Hepatic Function Latest Ref Rng & Units 09/17/2020 10/16/2019 05/05/2019  Total Protein 6.0 - 8.5 g/dL 6.1 6.1 5.9(L)  Albumin 3.8 - 4.8 g/dL 4.2 - -  AST 0 - 40 IU/L $Remov'17 17 16  'sXkRyO$ ALT 0 - 44 IU/L $Remov'20 18 14  'UPhbtM$ Alk Phosphatase  44 - 121 IU/L 90 - -  Total Bilirubin 0.0 - 1.2 mg/dL 0.3 0.4 0.4  Bilirubin, Direct 0.00 - 0.40 mg/dL - - -    Lab Results  Component Value Date/Time   TSH 4.840 (H) 09/17/2020 11:17 AM   TSH 3.00 10/16/2019 02:24 PM    CBC Latest Ref Rng & Units 09/17/2020 07/18/2020 02/03/2019  WBC 3.4 - 10.8 x10E3/uL 8.8 5.5 5.9  Hemoglobin 13.0 - 17.7 g/dL 10.9(L) 9.7(L) 11.5(L)  Hematocrit 37.5 - 51.0 % 34.8(L) 31.8(L) 35.9(L)  Platelets 150 - 450 x10E3/uL 233 239 178    Lab Results  Component Value Date/Time   VD25OH 72 10/16/2019 02:24 PM    Clinical ASCVD: Yes  The ASCVD Risk score Mikey Bussing DC Jr., et al., 2013) failed to calculate for the following reasons:   The patient has a prior MI or stroke diagnosis    Depression screen [facility] 2/9 09/16/2020 09/11/2020 07/23/2020  Decreased Interest 1 0 0  Down, Depressed, Hopeless 1 0 0  PHQ - 2 Score 2 0 0  Altered sleeping 1 - -  Tired, decreased energy 1 - -  Change in appetite 2 - -  Feeling bad or failure about yourself  0 - -  Trouble concentrating 1 - -  Moving slowly or fidgety/restless 1 - -  Suicidal thoughts 0 - -  PHQ-9 Score 8 - -  Difficult doing work/chores - - -  Some recent data might be hidden    Social History   Tobacco Use  Smoking Status Never Smoker  Smokeless Tobacco Never Used  Tobacco Comment   smoking cessation materials not required   BP Readings from Last 3 Encounters:  03/06/21 94/60  03/04/21 102/78  12/09/20 110/80   Pulse Readings from Last 3 Encounters:  03/06/21 91  03/04/21 (!) 112  12/09/20 94   Wt Readings from Last 3 Encounters:  03/06/21 233 lb 12.8 oz (106.1 kg)  03/04/21 231 lb (104.8 kg)  12/09/20 241 lb (109.3 kg)   BMI Readings from Last 3 Encounters:  03/06/21 36.62 kg/m  03/04/21 36.18 kg/m  12/09/20 37.75 kg/m    Assessment/Interventions: Review of patient past medical history, allergies, medications, health status, including review of consultants reports, laboratory and  other test data, was performed as part of comprehensive evaluation and provision of chronic care management services.   SDOH:  (Social Determinants of Health) assessments and interventions performed: Yes SDOH Interventions   Flowsheet Row Most Recent Value  SDOH Interventions   Financial Strain Interventions Other (Comment)  [PAP]     SDOH Screenings   Alcohol Screen: Low Risk   . Last Alcohol Screening Score (AUDIT): 1  Depression (PHQ2-9): Medium Risk  . PHQ-2 Score: 8  Financial Resource Strain: High Risk  . Difficulty of Paying Living Expenses: Hard  Food Insecurity: No Food Insecurity  . Worried About Charity fundraiser in the Last Year: Never true  . Ran Out of Food in the Last Year: Never true  Housing: Low Risk   . Last Housing Risk Score: 0  Physical Activity: Insufficiently Active  . Days of Exercise per Week: 3 days  . Minutes of Exercise per Session: 30 min  Social Connections: Socially Integrated  . Frequency of Communication with Friends and Family: More than three times a week  . Frequency of Social Gatherings with Friends and Family: Three times a week  . Attends Religious Services: More than 4 times per year  . Active Member of Clubs or Organizations: Yes  . Attends Archivist Meetings: More than 4 times per year  . Marital Status: Married  Stress: No Stress Concern Present  . Feeling of Stress : Only a little  Tobacco Use: Low Risk   . Smoking Tobacco Use: Never Smoker  . Smokeless Tobacco Use: Never Used  Transportation Needs: No Transportation Needs  . Lack of Transportation (Medical): No  . Lack of Transportation (Non-Medical): No    CCM Care Plan  Allergies  Allergen Reactions  . Azathioprine Other (See Comments)    Azathioprine hypersensitivity reaction  . Novolog [Insulin Aspart] Hives    Medications Reviewed Today    Reviewed by [name], MD (Physician) on 03/06/21 at 1700  Med List Status: <None>  Medication Order Taking?  Sig Documenting Provider Last Dose Status Informant  albuterol (PROVENTIL HFA;VENTOLIN HFA) 108 (90 Base) MCG/ACT inhaler [id number] Yes Inhale 2 puffs into the lungs every 6 (six) hours as needed for wheezing or shortness of breath.  [provider] Taking Active Pharmacy Records  albuterol (PROVENTIL) (2.5 MG/3ML) 0.083% nebulizer solution [id number] Yes Take 2.5 mg by nebulization every 6 (six) hours as needed for wheezing or shortness of breath.  [provider] Taking Active Pharmacy Records  aspirin EC 81 MG tablet [id number] Yes Take 1 tablet (81 mg total) by mouth daily. Theora Gianotti, NP Taking Active Pharmacy Records  clopidogrel (PLAVIX) 75 MG tablet [id number] Yes TAKE 1 TABLET(75 MG) BY MOUTH DAILY WITH BREAKFAST [name], MD Taking Active   cyclobenzaprine (FLEXERIL) 10 MG tablet [id number] Yes Take 10 mg by mouth 2 (two) times daily as needed. [provider] Taking Active   dexlansoprazole (DEXILANT) 60 MG capsule [id number] Yes Take 1 capsule (60 mg total) by mouth daily. [name], MD Taking Active   DULoxetine (CYMBALTA) 60 MG capsule [id number] Yes TAKE 1 CAPSULE(60 MG) BY MOUTH DAILY Sowles, Drue Stager, MD Taking Active   empagliflozin (JARDIANCE) 10 MG TABS tablet [id number] Yes Take 1 tablet (10 mg total) by mouth daily before breakfast. Loel Dubonnet, NP Taking Active   ezetimibe (ZETIA) 10 MG tablet [id number]  Take 1 tablet (10 mg total) by mouth daily. [name], MD  Expired 12/03/20 2359   Fluticasone-Umeclidin-Vilant (TRELEGY ELLIPTA) 100-62.5-25 MCG/INH AEPB [id number] Yes Inhale 1 each into the lungs daily. In place of breo Ancil Boozer, Drue Stager, MD Taking Active   furosemide (LASIX) 20 MG tablet [id number] Yes TAKE 1 TABLET BY MOUTH DAILY AS NEEDED( FOR WEIGHT GAIN OF 2 POUNDS OVERNIGHT OR 5 POUNDS IN 1 WEEK) Loel Dubonnet, NP Taking Active   gabapentin (NEURONTIN) 300 MG capsule [id number] Yes Take 1 capsule (300 mg total) by  mouth 3 (three) times daily. Roselee Nova, MD Taking Active Pharmacy Records           Med Note ([place], [place] Rocker   Thu Feb 09, 2019 10:51 AM)    gabapentin (NEURONTIN) 800 MG tablet [id number] Yes Take 800 mg by mouth at bedtime. Mehrabyan, Anahit [place]uskin, MD Taking Active   insulin lispro (HUMALOG) 100 UNIT/ML KiwkPen [id number] Yes Inject 7-8 Units into the skin 3 (three) times daily. Reported on 12/02/2015/ sliding scale 1  unit for every 7 units of carbs; and 1 unit for every 20 above 120 [provider] Taking Active Pharmacy Records  Insulin Pen Needle 32G X 4 MM MISC [id number] Yes Inject 1 each into the skin as needed. [provider] Taking Active Pharmacy Records  LANTUS SOLOSTAR 100 UNIT/ML Solostar Pen [id number] Yes Inject 31-33 Units into the skin daily at 10 pm. Sherri Sear, MD Taking Active Pharmacy Records           Med Note [facility] [place], [place] Rocker   Thu Feb 09, 2019 10:52 AM)    levocetirizine (XYZAL) 5 MG tablet [id number] Yes TAKE 1 TABLET(5 MG) BY MOUTH EVERY Lorelei Pont, Drue Stager, MD Taking Active   levothyroxine (SYNTHROID) 50 MCG tablet [id number] Yes Take 1 tablet (50 mcg total) by mouth daily before breakfast. One daily and two on Sundays - recheck level in 6 weeks Edgar Sizer, MD Taking Active   loratadine (CLARITIN) 10 MG tablet [id number] Yes Take 10 mg by mouth daily.  [provider] Taking Active Pharmacy Records  metFORMIN (GLUCOPHAGE) 1000 MG tablet [id number] Yes Take 1,000 mg by mouth 2 (two) times daily with a meal. Sherri Sear, MD Taking Active Pharmacy Records  metoprolol succinate (TOPROL-XL) 100 MG 24 hr tablet [id number] Yes TAKE 1 TABLET(100 MG) BY MOUTH DAILY WITH OR IMMEDIATELY FOLLOWING A MEAL Loel Dubonnet, NP Taking Active   montelukast (SINGULAIR) 10 MG tablet [id number] Yes Take 10 mg by mouth at bedtime. [provider] Taking Active   Multiple Vitamins-Minerals (MENS MULTI  VITAMIN & MINERAL PO) [id number] Yes Take 1 tablet by mouth in the morning and at bedtime. [provider] Taking Active   Na Sulfate-K Sulfate-Mg Sulf (SUPREP BOWEL PREP KIT) 17.5-3.13-1.6 GM/177ML SOLN [id number]  Take 1 kit by mouth as directed. [name], MD  Active   naloxone [facility]) nasal spray 4 mg/0.1 mL [id number] Yes For excess sedation from opioids Molli Barrows, MD Taking Active Pharmacy Records           Med Note ([place], [place] Rocker   Mon Dec 09, 2020  1:24 PM)    nitroGLYCERIN (NITROSTAT) 0.3 MG SL tablet [id number] Yes Place 1 tablet (0.3 mg total) under the tongue every 5 (five) minutes as needed for chest pain. Maximum 3 doses. Theora Gianotti, NP Taking Active   Oxycodone HCl 10 MG TABS [id number] Yes Take 1 tablet (10 mg total) by mouth in the morning, at noon, in the evening, and at bedtime. Molli Barrows, MD Taking Active   Oxycodone HCl 10 MG TABS [id number] Yes Take 1 tablet (10 mg total) by mouth in the morning, at noon, in the evening, and at bedtime. Molli Barrows, MD Taking Active   rosuvastatin (CRESTOR) 5 MG tablet [id number] Yes Take 5 mg by mouth daily. [name], MD Taking Active Pharmacy Records  Semaglutide,0.25 or 0.5MG /DOS, 2 MG/1.5ML Bonney Aid [id number] Yes Inject 1 mg into the skin once a week. [provider] Taking Active   tamsulosin (FLOMAX) 0.4 MG CAPS capsule [id number] Yes TAKE 2 CAPSULES BY MOUTH DAILY Stoioff, Ronda Fairly, MD Taking Active   Med List Note Landis Martins, RN 09/11/20 1444): uds 02-20-21 MR 11-10-20          Patient Active Problem List   Diagnosis Date Noted  . Pain due to onychomycosis of toenails of both feet 11/11/2020  . Lung involvement associated with another disorder ([place]) 05/17/2020  . Left leg weakness  12/01/2019  . Left arm weakness 10/04/2019  . Lower urinary tract symptoms 09/15/2019  . Incomplete bladder emptying 09/15/2019  . Carpal tunnel syndrome on both sides 07/12/2019  .  Weakness generalized 06/21/2019  . Idiopathic peripheral neuropathy 04/21/2019  . Sensory ataxia 03/09/2019  . Colitis 07/08/2018  . OSA on CPAP 07/08/2018  . Congestive heart failure (CHF) ([place]) 05/05/2018  . Unstable angina ([place]) 04/26/2018  . Ischemic cardiomyopathy 04/26/2018  . Chronic combined systolic and diastolic CHF (congestive heart failure) ([place]) 04/26/2018  . Effort angina ([place]) 04/25/2018  . 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[place]) 01/25/2018  . Hyperlipidemia due to type 2 diabetes mellitus ([place]) 08/12/2017  . Vitamin D deficiency, unspecified 08/12/2017  . Ptosis of left eyelid 01/08/2017  . Dermatitis 12/24/2016  . Difficulty walking 04/27/2016  . Foot cramps 04/27/2016  . Morbid (severe) obesity due to excess calories ([place]) 04/06/2016  . Benign neoplasm of sigmoid colon   . Benign neoplasm of descending colon   . Benign neoplasm of transverse colon   . Coronary artery disease involving native coronary artery with angina pectoris ([place]) 10/22/2015  . Coronary artery disease 08/22/2015  . DM (diabetes mellitus), type 2, uncontrolled with complications ([place]) 78/58/8502  . Myasthenia gravis ([place]) 08/22/2015  . Chronic left-sided low back pain 08/22/2015  . Hypertension 08/22/2015  . GERD (gastroesophageal reflux disease) 08/22/2015  . Arthritis 08/22/2015  . Diabetic peripheral neuropathy ([place]) 08/22/2015  . Hyperlipidemia 08/22/2015  . Insomnia 08/22/2015  . Type 2 diabetes mellitus with diabetic polyneuropathy, with long-term current use of insulin ([place]) 08/22/2015  . Asthma 08/22/2015  . Lyme disease 06/05/1992    Immunization History  Administered Date(s) Administered  . Influenza Inj Mdck Quad Pf 06/14/2019  . Influenza,inj,Quad PF,6+ Mos 06/16/2017, 06/10/2018, 07/23/2020  . PFIZER(Purple Top)SARS-COV-2 Vaccination 12/22/2019, 01/16/2020, 08/05/2020  . Pneumococcal Conjugate-13 07/21/2019  . Pneumococcal Polysaccharide-23 01/25/2018  . Td  04/04/2013    Conditions to be addressed/monitored:  Hypertension, Hyperlipidemia, Diabetes, Heart Failure, Coronary Artery Disease and Asthma  Care Plan : General Pharmacy (Adult)  Updates made by Germaine Pomfret, RPH since 03/10/2021 12:00 AM    Problem: Hypertension, Hyperlipidemia, Diabetes, Heart Failure, Coronary Artery Disease and Asthma   Priority: High    Long-Range Goal: Patient-Specific Goal   Start Date: 12/05/2020  Expected End Date: 06/11/2021  This Visit's Progress: On track  Recent Progress: On track  Priority: High  Note:   Current Barriers:  . Unable to independently afford treatment regimen  Pharmacist Clinical Goal(s):  [name] Over the next 90 days, patient will verbalize ability to afford treatment regimen through collaboration with PharmD and provider.   Interventions: . 1:1 collaboration with Edgar Sizer, MD regarding development and update of comprehensive plan of care as evidenced by provider attestation and co-signature . Inter-disciplinary care team collaboration (see longitudinal plan of care) . Comprehensive medication review performed; medication list updated in electronic medical record  Hyperlipidemia: (LDL goal < 70) -History of MI in 2010 -Controlled -Current treatment: . Ezetimibe 10 mg daily  . Rosuvastatin 5 mg daily  -Current antiplatelet treatment: . Aspirin 81 mg daily  . Clopidogrel 75 mg daily  -Medications previously tried: NA  -Cramping on higher doses of rosuvastatin  -Educated on Importance of limiting foods high in cholesterol; Exercise goal of 150 minutes per week; -Recommended to continue current medication  Diabetes (A1c goal <8%) -Controlled -Current medications: . Humalog 1 unit per 8g of carb, 1 unit for every 20 glucose above 120 three times  daily  . Lantus 20-23 units daily  . Jardiance 10 mg daily  . Metformin 1000 mg twice daily  . Ozempic 1 mg weekly  -Medications previously tried: Victoza  -Current home  glucose readings . fasting glucose: 85-120 -Denies hypoglycemic/hyperglycemic symptoms -Tolerating Ozempic dose increase, Jardiance start well.  -Educated on Prevention and management of hypoglycemic episodes; Carbohydrate counting and/or plate method -Counseled to check feet daily and get yearly eye exams -Recommended to continue current medication  Heart Failure (Goal: control symptoms and prevent exacerbations) -Controlled Type: Systolic -NYHA Class: II (slight limitation of activity) -Ejection fraction: 35-40% (Date: 12/03/20) -Current treatment:  Furosemide 20 mg daily as needed  Metoprolol XL 100 mg daily  -Medications previously tried: bisprolol, HCTZ, carvedilol  -Current home BP/HR readings: NA -Current dietary habits: Following low carb, low sodium diet.  -Significant dizziness when taking furosemide.  -Educated on Importance of weighing daily; if you gain more than 3 pounds in one day or 5 pounds in one week, contact provider  -Recommended to continue current medication  Asthma/COPD (Goal: control symptoms and prevent exacerbations) -Controlled -Current treatment  . Albuterol 2 puffs every 6 hours as needed  . Albuterol 3 mL nebulizer every 6 hours as needed  . Montelukast 10 mg nightly . Trelegy 1 puff daily  -Current allergic rhinitis treatment  . Levocetirizine 5 mg nightly  . Loratadine 10 mg daily   -Medications previously tried: NA  -Pulmonary function testing: NA -Exacerbations requiring treatment in last 6 months: yes, multiple steroid bursts  -Patient reports consistent use of maintenance inhaler -Frequency of rescue inhaler use: ~1-2 times weekly  -Counseled on Proper inhaler technique; -Recommended to continue current medication  GERD (Goal: Prevent heartburn or reflux symptoms) -Controlled -Current treatment  . Dexlansoprazole 60 mg daily  -Medications previously tried: omeprazole (ineffective)   -Significant rebound symptoms if skips daily   -Recommended to continue current medication  Chronic Pain (Goal: Maintain adequate pain relief) -Controlled -Current treatment  . Cyclobenzaprine 10 mg twice daily as needed  . Gabapentin 300 mg 1 capsule three times daily  . Gabapentin 800 mg nightly  . Oxycodone 10 mg four times daily  -Medications previously tried: NA -Counseled patient on risk of CNS depression, especially with combined use of oxycodone with gabapentin, cyclobenzaprine, or alcohol use.   -Recommended to continue current medication  Urinary Obstruction (Goal: maintain stable urine flow) -Controlled -Current treatment  . Tamsulosin 0.4 mg 2 capsules daily  -Medications previously tried: NA  -Much improved since starting tamsulosin. Could consider decreased to 0.4 mg daily if symptoms stable to minimize risk of dizziness, will address at future visit.  -Recommended to continue current medication  Depression/Anxiety (Goal: achieve stable mood) -Uncontrolled -Current treatment: . Duloxetine 60 mg daily  -Medications previously tried/failed: NA -PHQ9: 8 -GAD7: 5 -Connected with none for mental health support. Planning on establishing with behavioral health, but has not set up yet.  -Educated on Benefits of medication for symptom control Benefits of cognitive-behavioral therapy with or without medication -Recommended to continue current medication   Patient Goals/Self-Care Activities . Over the next 90 days, patient will:  - check glucose at least 3 times daily , document, and provide at future appointments check blood pressure weekly , document, and provide at future appointments weigh daily, and contact provider if weight gain of greater than 3 pounds in one day  Follow Up Plan: Telephone follow up appointment with care management team member scheduled for:  06/11/2021 at 2:00 PM  Medication Assistance: Application for Ozempic, Lantus, Humalog, Trelegy, Dexilant, and Jardiance  medication assistance  program. in process.  Anticipated assistance start date TBD.  See plan of care for additional detail.  Compliance/Adherence/Medication fill history: Care Gaps: Shingrix Urine Microalbumin Ophthalmology Colonoscopy   Star-Rating Drugs: Rosuvastatin 5 mg daily: LF 01/16/21 for 90-DS Metformin 1000 mg twice daily: LF 01/16/21 for 90-DS  Patient's preferred pharmacy is:  [id number] [address], [place] [address] Phone: [phone] Fax: [phone]  Uses pill box? Yes Pt endorses 100% compliance  We discussed: Current pharmacy is preferred with insurance plan and patient is satisfied with pharmacy services Patient decided to: Continue current medication management strategy  Care Plan and Follow Up Patient Decision:  Patient agrees to Care Plan and Follow-up.  Plan: Telephone follow up appointment with care management team member scheduled for:  06/11/2021 at 2:00 PM  Doristine Section, [facility] [phone]

## 2021-03-06 NOTE — Progress Notes (Signed)
Primary Care Physician: [name], MD  Primary Gastroenterologist:  Dr. [name]  Chief Complaint  Patient presents with  . Gastroesophageal Reflux    HPI: [name] is a 60 y.o. male here for follow-up of GERD.  The patient states that his timing of his Dexilant has made a big difference in his symptoms.  The patient reports very little symptoms at the present time. The patient had adenomatous polyps and his last colonoscopy and is due for repeat colonoscopy.  The patient is not having any worrisome symptoms such as dysphagia black stools bloody stools nausea or vomiting.  Past Medical History:  Diagnosis Date  . Allergy    dust, seasonal (worse in the fall).  . 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 Followed by Pain Specialist in [place], back and neck  . Asthma    BRONCHITIS  . Cataract    First Dx in 2012  . Chronic combined systolic and diastolic congestive heart failure ([place])    a. 03/2018 Echo: EF 30-35%, ant, antlat, apical AK, Gr1 DD; b. 07/2018 Echo: EF 35-40%, anteroseptal, apical, and ant HK. Gr1 DD; c. 09/2019 TEE: EF 40-45%.  . Coronary artery disease    a. Prior Ant MI->s/p multiple stents placed in the LAD and right coronary artery ([place]); b. 2016 Cath: reportedly nonobs dzs;  c. 04/2018 Cath/PCI: LM nl, LAD 20p, patent mid stent, LCX 68m(3.25x15 Sierra DES), OM1 nl, OM2 50, OM3 40 w/ patent stent, RCA 40p, 47m, 40d w/ patent stent in RPDA, RPAV 60, EF 25-35%. 2+MR; d. 02/2019 MV: Apical scar, no isch, EF 30-44%.  . Diabetes mellitus without complication ([place])    TYPE 2  . Diabetic peripheral neuropathy (HCC)    feet and hands  . FUO (fever of unknown origin) 08/03/2018  . GERD (gastroesophageal reflux disease)   . Headache    muscle tension  . Hyperlipidemia   . Hypertension    CONTROLLED ON MEDS  . Insomnia   . Ischemic cardiomyopathy    a. 03/2018 Echo: EF 30-35%, ant, antlat, apical AK, Gr1 DD, mild MR, mildly dil LA; b. 07/2018 Echo: EF 35-40%; c.  09/2018 TEE: EF 40-45%, antsept, ant HK.  [name] Knee pain, acute 05/06/2020  . Left arm weakness 10/04/2019  . Left leg weakness 12/01/2019  . Lyme disease    Chronic  . Myasthenia gravis ([place])   . Myocardial infarction ([place]) 2010  . Seasonal allergies   . Sepsis ([place])    a.07/2018 - unknown source. TEE neg for veg 09/2019.  [name] Sleep apnea    CPAP    Current Outpatient Medications  Medication Sig Dispense Refill  . albuterol (PROVENTIL HFA;VENTOLIN HFA) 108 (90 Base) MCG/ACT inhaler Inhale 2 puffs into the lungs every 6 (six) hours as needed for wheezing or shortness of breath.     [name] albuterol (PROVENTIL) (2.5 MG/3ML) 0.083% nebulizer solution Take 2.5 mg by nebulization every 6 (six) hours as needed for wheezing or shortness of breath.     [name] aspirin EC 81 MG tablet Take 1 tablet (81 mg total) by mouth daily. 90 tablet 3  . clopidogrel (PLAVIX) 75 MG tablet TAKE 1 TABLET(75 MG) BY MOUTH DAILY WITH BREAKFAST 30 tablet 2  . cyclobenzaprine (FLEXERIL) 10 MG tablet Take 10 mg by mouth 2 (two) times daily as needed.    [name] dexlansoprazole (DEXILANT) 60 MG capsule Take 1 capsule (60 mg total) by mouth daily. 30 capsule 11  . DULoxetine (CYMBALTA) 60  MG capsule TAKE 1 CAPSULE(60 MG) BY MOUTH DAILY 90 capsule 0  . empagliflozin (JARDIANCE) 10 MG TABS tablet Take 1 tablet (10 mg total) by mouth daily before breakfast. 30 tablet 2  . Fluticasone-Umeclidin-Vilant (TRELEGY ELLIPTA) 100-62.5-25 MCG/INH AEPB Inhale 1 each into the lungs daily. In place of breo 60 each 2  . furosemide (LASIX) 20 MG tablet TAKE 1 TABLET BY MOUTH DAILY AS NEEDED( FOR WEIGHT GAIN OF 2 POUNDS OVERNIGHT OR 5 POUNDS IN 1 WEEK) 90 tablet 0  . gabapentin (NEURONTIN) 300 MG capsule Take 1 capsule (300 mg total) by mouth 3 (three) times daily. 90 capsule 1  . gabapentin (NEURONTIN) 800 MG tablet Take 800 mg by mouth at bedtime.    . insulin lispro (HUMALOG) 100 UNIT/ML KiwkPen Inject 7-8 Units into the skin 3 (three) times daily. Reported  on 12/02/2015/ sliding scale 1 unit for every 7 units of carbs; and 1 unit for every 20 above 120    . Insulin Pen Needle 32G X 4 MM MISC Inject 1 each into the skin as needed.    [name] LANTUS SOLOSTAR 100 UNIT/ML Solostar Pen Inject 31-33 Units into the skin daily at 10 pm.  0  . levocetirizine (XYZAL) 5 MG tablet TAKE 1 TABLET(5 MG) BY MOUTH EVERY EVENING 30 tablet 3  . levothyroxine (SYNTHROID) 50 MCG tablet Take 1 tablet (50 mcg total) by mouth daily before breakfast. One daily and two on Sundays - recheck level in 6 weeks 32 tablet 1  . loratadine (CLARITIN) 10 MG tablet Take 10 mg by mouth daily.     . metFORMIN (GLUCOPHAGE) 1000 MG tablet Take 1,000 mg by mouth 2 (two) times daily with a meal.    . metoprolol succinate (TOPROL-XL) 100 MG 24 hr tablet TAKE 1 TABLET(100 MG) BY MOUTH DAILY WITH OR IMMEDIATELY FOLLOWING A MEAL 90 tablet 2  . montelukast (SINGULAIR) 10 MG tablet Take 10 mg by mouth at bedtime.    . Multiple Vitamins-Minerals (MENS MULTI VITAMIN & MINERAL PO) Take 1 tablet by mouth in the morning and at bedtime.    . naloxone (NARCAN) nasal spray 4 mg/0.1 mL For excess sedation from opioids 1 kit 2  . nitroGLYCERIN (NITROSTAT) 0.3 MG SL tablet Place 1 tablet (0.3 mg total) under the tongue every 5 (five) minutes as needed for chest pain. Maximum 3 doses. 25 tablet 3  . [START ON 03/08/2021] Oxycodone HCl 10 MG TABS Take 1 tablet (10 mg total) by mouth in the morning, at noon, in the evening, and at bedtime. 120 tablet 0  . [START ON 04/07/2021] Oxycodone HCl 10 MG TABS Take 1 tablet (10 mg total) by mouth in the morning, at noon, in the evening, and at bedtime. 120 tablet 0  . rosuvastatin (CRESTOR) 5 MG tablet Take 5 mg by mouth daily.  0  . Semaglutide,0.25 or 0.5MG /DOS, 2 MG/1.5ML SOPN Inject 1 mg into the skin once a week.    . tamsulosin (FLOMAX) 0.4 MG CAPS capsule TAKE 2 CAPSULES BY MOUTH DAILY 60 capsule 2  . ezetimibe (ZETIA) 10 MG tablet Take 1 tablet (10 mg total) by mouth  daily. 90 tablet 2  . Na Sulfate-K Sulfate-Mg Sulf (SUPREP BOWEL PREP KIT) 17.5-3.13-1.6 GM/177ML SOLN Take 1 kit by mouth as directed. 354 mL 0   No current facility-administered medications for this visit.    Allergies as of 03/06/2021 - Review Complete 03/06/2021  Allergen Reaction Noted  . Azathioprine Other (See Comments) 11/16/2018  .  Novolog [insulin aspart] Hives 09/24/2015    ROS:  General: Negative for anorexia, weight loss, fever, chills, fatigue, weakness. ENT: Negative for hoarseness, difficulty swallowing , nasal congestion. CV: Negative for chest pain, angina, palpitations, dyspnea on exertion, peripheral edema.  Respiratory: Negative for dyspnea at rest, dyspnea on exertion, cough, sputum, wheezing.  GI: See history of present illness. GU:  Negative for dysuria, hematuria, urinary incontinence, urinary frequency, nocturnal urination.  Endo: Negative for unusual weight change.    Physical Examination:   BP 94/60   Pulse 91   Ht $R'5\' 7"'dI$  (1.702 m)   Wt 233 lb 12.8 oz (106.1 kg)   BMI 36.62 kg/m   General: Well-nourished, well-developed in no acute distress.  Eyes: No icterus. Conjunctivae pink. Lungs: Clear to auscultation bilaterally. Non-labored. Heart: Regular rate and rhythm, no murmurs rubs or gallops.  Abdomen: Bowel sounds are normal, nontender, nondistended, no hepatosplenomegaly or masses, no abdominal bruits or hernia , no rebound or guarding.   Extremities: No lower extremity edema. No clubbing or deformities. Neuro: Alert and oriented x 3.  Grossly intact. Skin: Warm and dry, no jaundice.   Psych: Alert and cooperative, normal mood and affect.  Labs:    Imaging Studies: No results found.  Assessment and Plan:   [name] is a 60 y.o. y/o male who comes in today with a history of GERD well-controlled on Dexilant. The patient is in need of a refill of his medications and this will be done.  The patient will also be set up for colonoscopy due  to his personal history of adenomatous colon polyps.  The patient has been explained the plan and agrees with it.     [name], MD. Marval Regal    Note: This dictation was prepared with Dragon dictation along with smaller phrase technology. Any transcriptional errors that result from this process are unintentional.

## 2021-03-07 NOTE — Progress Notes (Signed)
Virtual Visit via Telephone Note  I connected with [name] on 03/07/21 at  2:30 PM EDT by telephone and verified that I am speaking with the correct person using two identifiers.  Location: Patient: Home Provider: [facility] control center   I discussed the limitations, risks, security and privacy concerns of performing an evaluation and management service by telephone and the availability of in person appointments. I also discussed with the patient that there may be a patient responsible charge related to this service. The patient expressed understanding and agreed to proceed.   History of Present Illness: I spoke with Tammy mother via telephone as we were unable to link for the video portion of this conference.  He reports that he is doing well but is having some lower back pain comparable to what he intermittently experiences.  He generally gets intermittent facet blocks for his pain.  He is requesting to do this in the next few weeks.  He has had a wind up of the same quality pain in his low back with radiation into both hips and buttocks.  He did receive an epidural steroid injection a few months ago and this did help with some of the sciatica calf cramping and lower extremity pain that he was experiencing at that time.  He still taking his medications as prescribed and this combination of therapy seems to work well for him and keeps his pain under control and allows him to stay active and sleep better.  No side effects are reported.  His quality of life as reviewed his overall improved with his current management.  The quality characteristic and distribution of the back pain also are stable in nature with no recent changes reported and his lower extremity strength and function are stable in nature.   Observations/Objective:  Current Outpatient Medications:  .  [START ON 04/07/2021] Oxycodone HCl 10 MG TABS, Take 1 tablet (10 mg total) by mouth in the morning, at noon, in the evening, and at  bedtime., Disp: 120 tablet, Rfl: 0 .  albuterol (PROVENTIL HFA;VENTOLIN HFA) 108 (90 Base) MCG/ACT inhaler, Inhale 2 puffs into the lungs every 6 (six) hours as needed for wheezing or shortness of breath. , Disp: , Rfl:  .  albuterol (PROVENTIL) (2.5 MG/3ML) 0.083% nebulizer solution, Take 2.5 mg by nebulization every 6 (six) hours as needed for wheezing or shortness of breath. , Disp: , Rfl:  .  aspirin EC 81 MG tablet, Take 1 tablet (81 mg total) by mouth daily., Disp: 90 tablet, Rfl: 3 .  clopidogrel (PLAVIX) 75 MG tablet, TAKE 1 TABLET(75 MG) BY MOUTH DAILY WITH BREAKFAST, Disp: 30 tablet, Rfl: 2 .  cyclobenzaprine (FLEXERIL) 10 MG tablet, Take 10 mg by mouth 2 (two) times daily as needed., Disp: , Rfl:  .  dexlansoprazole (DEXILANT) 60 MG capsule, Take 1 capsule (60 mg total) by mouth daily., Disp: 30 capsule, Rfl: 11 .  DULoxetine (CYMBALTA) 60 MG capsule, TAKE 1 CAPSULE(60 MG) BY MOUTH DAILY, Disp: 90 capsule, Rfl: 0 .  empagliflozin (JARDIANCE) 10 MG TABS tablet, Take 1 tablet (10 mg total) by mouth daily before breakfast., Disp: 30 tablet, Rfl: 2 .  ezetimibe (ZETIA) 10 MG tablet, Take 1 tablet (10 mg total) by mouth daily., Disp: 90 tablet, Rfl: 2 .  Fluticasone-Umeclidin-Vilant (TRELEGY ELLIPTA) 100-62.5-25 MCG/INH AEPB, Inhale 1 each into the lungs daily. In place of breo, Disp: 60 each, Rfl: 2 .  furosemide (LASIX) 20 MG tablet, TAKE 1 TABLET BY MOUTH DAILY  AS NEEDED( FOR WEIGHT GAIN OF 2 POUNDS OVERNIGHT OR 5 POUNDS IN 1 WEEK), Disp: 90 tablet, Rfl: 0 .  gabapentin (NEURONTIN) 300 MG capsule, Take 1 capsule (300 mg total) by mouth 3 (three) times daily., Disp: 90 capsule, Rfl: 1 .  gabapentin (NEURONTIN) 800 MG tablet, Take 800 mg by mouth at bedtime., Disp: , Rfl:  .  insulin lispro (HUMALOG) 100 UNIT/ML KiwkPen, Inject 7-8 Units into the skin 3 (three) times daily. Reported on 12/02/2015/ sliding scale 1 unit for every 7 units of carbs; and 1 unit for every 20 above 120, Disp: , Rfl:  .   Insulin Pen Needle 32G X 4 MM MISC, Inject 1 each into the skin as needed., Disp: , Rfl:  .  LANTUS SOLOSTAR 100 UNIT/ML Solostar Pen, Inject 31-33 Units into the skin daily at 10 pm., Disp: , Rfl: 0 .  levocetirizine (XYZAL) 5 MG tablet, TAKE 1 TABLET(5 MG) BY MOUTH EVERY EVENING, Disp: 30 tablet, Rfl: 3 .  levothyroxine (SYNTHROID) 50 MCG tablet, Take 1 tablet (50 mcg total) by mouth daily before breakfast. One daily and two on Sundays - recheck level in 6 weeks, Disp: 32 tablet, Rfl: 1 .  loratadine (CLARITIN) 10 MG tablet, Take 10 mg by mouth daily. , Disp: , Rfl:  .  metFORMIN (GLUCOPHAGE) 1000 MG tablet, Take 1,000 mg by mouth 2 (two) times daily with a meal., Disp: , Rfl:  .  metoprolol succinate (TOPROL-XL) 100 MG 24 hr tablet, TAKE 1 TABLET(100 MG) BY MOUTH DAILY WITH OR IMMEDIATELY FOLLOWING A MEAL, Disp: 90 tablet, Rfl: 2 .  montelukast (SINGULAIR) 10 MG tablet, Take 10 mg by mouth at bedtime., Disp: , Rfl:  .  Multiple Vitamins-Minerals (MENS MULTI VITAMIN & MINERAL PO), Take 1 tablet by mouth in the morning and at bedtime., Disp: , Rfl:  .  Na Sulfate-K Sulfate-Mg Sulf (SUPREP BOWEL PREP KIT) 17.5-3.13-1.6 GM/177ML SOLN, Take 1 kit by mouth as directed., Disp: 354 mL, Rfl: 0 .  naloxone (NARCAN) nasal spray 4 mg/0.1 mL, For excess sedation from opioids, Disp: 1 kit, Rfl: 2 .  nitroGLYCERIN (NITROSTAT) 0.3 MG SL tablet, Place 1 tablet (0.3 mg total) under the tongue every 5 (five) minutes as needed for chest pain. Maximum 3 doses., Disp: 25 tablet, Rfl: 3 .  [START ON 03/08/2021] Oxycodone HCl 10 MG TABS, Take 1 tablet (10 mg total) by mouth in the morning, at noon, in the evening, and at bedtime., Disp: 120 tablet, Rfl: 0 .  rosuvastatin (CRESTOR) 5 MG tablet, Take 5 mg by mouth daily., Disp: , Rfl: 0 .  Semaglutide,0.25 or 0.5MG/DOS, 2 MG/1.5ML SOPN, Inject 1 mg into the skin once a week., Disp: , Rfl:  .  tamsulosin (FLOMAX) 0.4 MG CAPS capsule, TAKE 2 CAPSULES BY MOUTH DAILY, Disp: 60  capsule, Rfl: 2 Review of Systems  Constitutional: Negative for chills, fever and weight loss.  Respiratory: Negative for wheezing.   Cardiovascular: Negative for palpitations and orthopnea.  Gastrointestinal: Negative for abdominal pain and constipation.  Genitourinary: Negative for dysuria.  Musculoskeletal: Negative for myalgias.  Neurological: Negative for dizziness, sensory change and focal weakness.  Psychiatric/Behavioral: Negative for depression.   Assessment and Plan: 1. Left leg weakness   2. Lumbar spondylosis with myelopathy   3. DDD (degenerative disc disease), lumbar   4. Chronic pain syndrome   5. Chronic, continuous use of opioids   6. Sciatica, left side   7. Cervicalgia   8. Facet arthritis of lumbosacral  region   Based on her discussion today upon review of the [facility] practitioner database information I think it is appropriate to refill his medications.  This be dated for June 4 and July 4.  He is scheduled for a therapeutic lumbar facet block here in the next few weeks.  I want him to continue efforts at weight loss and stretching strengthening as reviewed once again.  Continue follow-up with his primary care physicians for his baseline medical care.  Follow Up Instructions:    I discussed the assessment and treatment plan with the patient. The patient was provided an opportunity to ask questions and all were answered. The patient agreed with the plan and demonstrated an understanding of the instructions.   The patient was advised to call back or seek an in-person evaluation if the symptoms worsen or if the condition fails to improve as anticipated.  I provided 30 minutes of non-face-to-face time during this encounter.   Molli Barrows, MD

## 2021-03-10 NOTE — Patient Instructions (Signed)
Visit Information It was great speaking with you today!  Please let me know if you have any questions about our visit.  Goals Addressed            This Visit's Progress   . Track and Manage Fluids and Swelling-Heart Failure   On track    Timeframe:  Long-Range Goal Priority:  High Start Date:   12/05/2020                          Expected End Date: 09/09/2021                      Follow Up Date 06/11/2021    - call office if I gain more than 2 pounds in one day or 5 pounds in one week - keep legs up while sitting - use salt in moderation    Why is this important?    It is important to check your weight daily and watch how much salt and liquids you have.   It will help you to manage your heart failure.    Notes:        Patient Care Plan: General Pharmacy (Adult)    Problem Identified: Hypertension, Hyperlipidemia, Diabetes, Heart Failure, Coronary Artery Disease and Asthma   Priority: High    Long-Range Goal: Patient-Specific Goal   Start Date: 12/05/2020  Expected End Date: 06/11/2021  This Visit's Progress: On track  Recent Progress: On track  Priority: High  Note:   Current Barriers:  . Unable to independently afford treatment regimen  Pharmacist Clinical Goal(s):  [name] Over the next 90 days, patient will verbalize ability to afford treatment regimen through collaboration with PharmD and provider.   Interventions: . 1:1 collaboration with [name], MD regarding development and update of comprehensive plan of care as evidenced by provider attestation and co-signature . Inter-disciplinary care team collaboration (see longitudinal plan of care) . Comprehensive medication review performed; medication list updated in electronic medical record  Hyperlipidemia: (LDL goal < 70) -History of MI in 2010 -Controlled -Current treatment: . Ezetimibe 10 mg daily  . Rosuvastatin 5 mg daily  -Current antiplatelet treatment: . Aspirin 81 mg daily  . Clopidogrel 75 mg daily   -Medications previously tried: NA  -Cramping on higher doses of rosuvastatin  -Educated on Importance of limiting foods high in cholesterol; Exercise goal of 150 minutes per week; -Recommended to continue current medication  Diabetes (A1c goal <8%) -Controlled -Current medications: . Humalog 1 unit per 8g of carb, 1 unit for every 20 glucose above 120 three times daily  . Lantus 20-23 units daily  . Jardiance 10 mg daily  . Metformin 1000 mg twice daily  . Ozempic 1 mg weekly  -Medications previously tried: Victoza  -Current home glucose readings . fasting glucose: 85-120 -Denies hypoglycemic/hyperglycemic symptoms -Tolerating Ozempic dose increase, Jardiance start well.  -Educated on Prevention and management of hypoglycemic episodes; Carbohydrate counting and/or plate method -Counseled to check feet daily and get yearly eye exams -Recommended to continue current medication  Heart Failure (Goal: control symptoms and prevent exacerbations) -Controlled Type: Systolic -NYHA Class: II (slight limitation of activity) -Ejection fraction: 35-40% (Date: 12/03/20) -Current treatment:  Furosemide 20 mg daily as needed  Metoprolol XL 100 mg daily  -Medications previously tried: bisprolol, HCTZ, carvedilol  -Current home BP/HR readings: NA -Current dietary habits: Following low carb, low sodium diet.  -Significant dizziness when taking furosemide.  -Educated on Importance of weighing  daily; if you gain more than 3 pounds in one day or 5 pounds in one week, contact provider  -Recommended to continue current medication  Asthma/COPD (Goal: control symptoms and prevent exacerbations) -Controlled -Current treatment  . Albuterol 2 puffs every 6 hours as needed  . Albuterol 3 mL nebulizer every 6 hours as needed  . Montelukast 10 mg nightly . Trelegy 1 puff daily  -Current allergic rhinitis treatment  . Levocetirizine 5 mg nightly  . Loratadine 10 mg daily   -Medications previously  tried: NA  -Pulmonary function testing: NA -Exacerbations requiring treatment in last 6 months: yes, multiple steroid bursts  -Patient reports consistent use of maintenance inhaler -Frequency of rescue inhaler use: ~1-2 times weekly  -Counseled on Proper inhaler technique; -Recommended to continue current medication  GERD (Goal: Prevent heartburn or reflux symptoms) -Controlled -Current treatment  . Dexlansoprazole 60 mg daily  -Medications previously tried: omeprazole (ineffective)   -Significant rebound symptoms if skips daily  -Recommended to continue current medication  Chronic Pain (Goal: Maintain adequate pain relief) -Controlled -Current treatment  . Cyclobenzaprine 10 mg twice daily as needed  . Gabapentin 300 mg 1 capsule three times daily  . Gabapentin 800 mg nightly  . Oxycodone 10 mg four times daily  -Medications previously tried: NA -Counseled patient on risk of CNS depression, especially with combined use of oxycodone with gabapentin, cyclobenzaprine, or alcohol use.   -Recommended to continue current medication  Urinary Obstruction (Goal: maintain stable urine flow) -Controlled -Current treatment  . Tamsulosin 0.4 mg 2 capsules daily  -Medications previously tried: NA  -Much improved since starting tamsulosin. Could consider decreased to 0.4 mg daily if symptoms stable to minimize risk of dizziness, will address at future visit.  -Recommended to continue current medication  Depression/Anxiety (Goal: achieve stable mood) -Uncontrolled -Current treatment: . Duloxetine 60 mg daily  -Medications previously tried/failed: NA -PHQ9: 8 -GAD7: 5 -Connected with none for mental health support. Planning on establishing with behavioral health, but has not set up yet.  -Educated on Benefits of medication for symptom control Benefits of cognitive-behavioral therapy with or without medication -Recommended to continue current medication   Patient Goals/Self-Care  Activities . Over the next 90 days, patient will:  - check glucose at least 3 times daily , document, and provide at future appointments check blood pressure weekly , document, and provide at future appointments weigh daily, and contact provider if weight gain of greater than 3 pounds in one day  Follow Up Plan: Telephone follow up appointment with care management team member scheduled for:  06/11/2021 at 2:00 PM      Patient agreed to services and verbal consent obtained.   The patient verbalized understanding of instructions, educational materials, and care plan provided today and declined offer to receive copy of patient instructions, educational materials, and care plan.   Doristine Section, [facility] [phone]

## 2021-03-17 ENCOUNTER — Encounter: Payer: Self-pay | Admitting: Gastroenterology

## 2021-03-18 ENCOUNTER — Encounter: Payer: Self-pay | Admitting: Gastroenterology

## 2021-03-18 NOTE — Progress Notes (Signed)
Hold Plavix 5 days before and resume 1 day after.  Continue aspirin 81 mg daily.

## 2021-03-19 ENCOUNTER — Telehealth: Payer: Self-pay

## 2021-03-19 NOTE — Telephone Encounter (Signed)
Author: [name], MD Service: Cardiology Author Type: Physician  Filed: 03/18/2021  5:31 PM Encounter Date: 03/06/2021 Status: Signed  Editor: [name], MD (Physician)             Hold Plavix 5 days before and resume 1 day after.  Continue aspirin 81 mg daily.

## 2021-03-19 NOTE — Telephone Encounter (Signed)
-----   Message from [name], MD sent at 03/18/2021  5:30 PM EDT -----    ----- Message ----- From: Glennie Isle, CMA Sent: 03/06/2021   4:23 PM EDT To: [name], MD

## 2021-03-26 ENCOUNTER — Telehealth: Payer: Self-pay | Admitting: Gastroenterology

## 2021-03-26 DIAGNOSIS — Z794 Long term (current) use of insulin: Secondary | ICD-10-CM | POA: Diagnosis not present

## 2021-03-26 DIAGNOSIS — E1165 Type 2 diabetes mellitus with hyperglycemia: Secondary | ICD-10-CM | POA: Diagnosis not present

## 2021-03-26 NOTE — Telephone Encounter (Signed)
Patient states he would like to move his procedure to 04/14/2021.. Called kim and got patient moved

## 2021-03-26 NOTE — Telephone Encounter (Signed)
Please call to reschedule procedure  

## 2021-04-01 NOTE — Anesthesia Preprocedure Evaluation (Addendum)
Anesthesia Evaluation  Patient identified by MRN, date of birth, ID band Patient awake    Reviewed: Allergy & Precautions, NPO status , Patient's Chart, lab work & pertinent test results  History of Anesthesia Complications Negative for: history of anesthetic complications  Airway Mallampati: IV   Neck ROM: Full    Dental   Missing few molars:   Pulmonary sleep apnea and Continuous Positive Airway Pressure Ventilation ,    Pulmonary exam normal breath sounds clear to auscultation       Cardiovascular hypertension, + CAD (s/p MI and stents on Plavix) and +CHF (2/2 ICM, EF 35-40%)  Normal cardiovascular exam Rhythm:Regular Rate:Normal  Echo 12/03/20 1. Left ventricular ejection fraction, by estimation, is 35 to 40%. The left ventricle has mildly decreased function. The left ventricle demonstrates regional wall motion abnormalities (Severe hypokinesis of the apical anterior, apical lateral, apical inferior, and apical segments consistent with scar). Left ventricular diastolic parameters are consistent with Grade I diastolic dysfunction (impaired relaxation).  2. Right ventricular systolic function is normal. The right ventricular size is normal. Tricuspid regurgitation signal is inadequate for assessing PA pressure.  3. Challenging images  ECG 07/24/20: NSR 90 bpm with RBBB and possible old inferior infarct and anterolateral infarct. No acute ST/T wave changes. Stable compared to previous EKG.   Neuro/Psych  Headaches,  Neuromuscular disease (myasthenia gravis, peripheral neuropathy)    GI/Hepatic GERD  ,  Endo/Other  diabetes, Type 2Hypothyroidism   Renal/GU      Musculoskeletal  (+) Arthritis ,   Abdominal   Peds  Hematology  (+) Blood dyscrasia, anemia ,   Anesthesia Other Findings Cardiology note 03/04/21:  1. Syncope / Fatigue / Orthostatic hypotension / Dizziness / Lightheadedness -ED visit 07/19/20 with syncope in  the setting of coughing and vagal response.  Orthostasis improved since discontinuation of Losartan.  ZIO 10/2020 with no significant arrhythmia and echo with stable LVEF 35-40% and no significant valvular abnormalities.  No evidence for further evaluation of syncope at this time symptoms have improved. Anticipate fatigue likely due to HFrEF and deconditioning - BMP, mag, CBC to rule out alternate etiology.   2. Anemia - 09/17/20 Hb 10.9. CBC today for monitoring due to intermittent fatigue. Goal >8 in setting of HFrEF.  Continue to follow with primary care provider.  3. CAD - No symptoms concerning for angina.  No indication for ischemic evaluation at this time.  GDMT includes aspirin, beta-blocker, statin.  4. 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  Previous intolerance to Entresto and carvedilol.  Losartan discontinued due to hypotension. Euvolemic and well compensated on exam. Compliant with low salt diet and fluid restriction. Tolerating Jardiance 10mg  daily. Weight down 10 lbs over last 3 months. Defer addition of MRA due to low normal BP. Previously referred to cardiac rehab, encouraged to participate. BMP, magnesium today for monitoring after addition of Jardiance. He was provided symptom log to evaluate fatigue.  5. HTN / Hypotension - Low normal BP with no significant lightheadedness. Losartan, Coreg, Entresto previously discontinued.    6. HLD - 09/17/20 LDL 44.  Continue Zetia 10mg  daily, Rosuvastatin 5mg  daily.  7. DM2 - 01/29/21 A1c 7.9. Continue to follow with PCP.  8. OSA - CPAP compliance encouraged.  9. Hypothyroidism - Continue to follow with PCP.   10. Asthma -continue to follow with Dr. Raul Del of pulmonology.   Reproductive/Obstetrics  Anesthesia Physical Anesthesia Plan  ASA: 4  Anesthesia Plan: General   Post-op Pain Management:    Induction: Intravenous  PONV Risk Score and Plan: 2  and Propofol infusion, TIVA and Treatment may vary due to age or medical condition  Airway Management Planned: Natural Airway  Additional Equipment:   Intra-op Plan:   Post-operative Plan:   Informed Consent: I have reviewed the patients History and Physical, chart, labs and discussed the procedure including the risks, benefits and alternatives for the proposed anesthesia with the patient or authorized representative who has indicated his/her understanding and acceptance.       Plan Discussed with: CRNA  Anesthesia Plan Comments:        Anesthesia Quick Evaluation

## 2021-04-01 NOTE — Progress Notes (Signed)
Name: [name]   MRN: [id number]    DOB: [birth year]   Date:04/02/2021       Progress Note  Subjective  Chief Complaint  Follow Up  HPI   Asthma : he is having sob and is seeing Dr. Raul Del, using Trelegy now and seems to be helping more than Anoro. He has a lot of productive cough , no wheezing.   DMII:  he is under the care of Dr. Honor Junes , A1C was 8.4 % but down to 7.9 % , he states glucose has been in the mid 100's , but sometimes drops about once a month . He states he has been checking glucose He has associated HTN, dyslipidemia, neuropathy.  He is on statin therapy, ARB and also gabapentin.   Hypothyroidism: he is currently on 50 mcg of levothyroxine but two on Sundays , he has mild constipation but likely secondary to oxydocone - stable on stool softener, chronic dry skin this time of the year. He has stable dysphagia - going on for 30 years, he chews his food well to avoid chocking  Low back pain/ inflammatory spondylopathy/Cervical radiculitis : under the care of Dr. [name], he gets epidural injections and takes oxycodone.    Morbid obesity: he has BMI above 35 with co-morbidities such as DM, dyslipidemia, OSA, CHF.   Myasthenia Gravis: seeing Dr. Westley Foots, he is off Mestinon, currently not on medications and symptoms are not any worse. He still has left eye ptosis, also has normal fatigue with activity, worse at the end of the day.   Atherosclerosis of aorta/ Angina and CHF: he states sleeps with the head of the bed elevated because of his wife, not sure if he has orthopnea, he has SOB with activity that is likely multifactorial, occasionally has lower extremity edema. He has been under a lot of stress with his son and his wife that are living with them. He has noticed increase in chest pain when stressed that resolves with NTG. He has intermittent SOB and fatigue. He is compliant with medications and follow up with cardiologist.   Patient Active Problem List   Diagnosis  Date Noted   Pain due to onychomycosis of toenails of both feet 11/11/2020   Lung involvement associated with another disorder ([place]) 05/17/2020   Left leg weakness 12/01/2019   Left arm weakness 10/04/2019   Lower urinary tract symptoms 09/15/2019   Incomplete bladder emptying 09/15/2019   Carpal tunnel syndrome on both sides 07/12/2019   Weakness generalized 06/21/2019   Idiopathic peripheral neuropathy 04/21/2019   Sensory ataxia 03/09/2019   Colitis 07/08/2018   OSA on CPAP 07/08/2018   Congestive heart failure (CHF) ([place]) 05/05/2018   Unstable angina ([place]) 04/26/2018   Ischemic cardiomyopathy 04/26/2018   Chronic combined systolic and diastolic CHF (congestive heart failure) ([place]) 04/26/2018   Effort angina ([place]) 04/25/2018   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[place]) 01/25/2018   Hyperlipidemia due to type 2 diabetes mellitus ([place]) 08/12/2017   Vitamin D deficiency, unspecified 08/12/2017   Ptosis of left eyelid 01/08/2017   Dermatitis 12/24/2016   Difficulty walking 04/27/2016   Foot cramps 04/27/2016   Morbid (severe) obesity due to excess calories ([place]) 04/06/2016   Benign neoplasm of sigmoid colon    Benign neoplasm of descending colon    Benign neoplasm of transverse colon    Coronary artery disease involving native coronary artery with angina pectoris ([place]) 10/22/2015   Coronary artery disease 08/22/2015   DM (diabetes mellitus), type 2, uncontrolled  with complications ([place]) 86/76/7209   Myasthenia gravis ([place]) 08/22/2015   Chronic left-sided low back pain 08/22/2015   Hypertension 08/22/2015   GERD (gastroesophageal reflux disease) 08/22/2015   Arthritis 08/22/2015   Diabetic peripheral neuropathy ([place]) 08/22/2015   Hyperlipidemia 08/22/2015   Insomnia 08/22/2015   Type 2 diabetes mellitus with diabetic polyneuropathy, with long-term current use of insulin ([place]) 08/22/2015   Asthma 08/22/2015   Lyme disease 06/05/1992    Past Surgical History:   Procedure Laterality Date   BILATERAL CARPAL TUNNEL RELEASE Bilateral L in 2012 and R in 2013   [place], most recent in  March 2016.   COLONOSCOPY WITH PROPOFOL N/A 01/10/2016   Procedure: COLONOSCOPY WITH PROPOFOL;  Surgeon: [name], MD;  Location: [facility];  Service: Endoscopy;  Laterality: N/A;   CORONARY ANGIOPLASTY     CORONARY STENT INTERVENTION N/A 04/25/2018   Procedure: CORONARY STENT INTERVENTION;  Surgeon: [name], MD;  Location: [facility];  Service: Cardiovascular;  Laterality: N/A;   ESOPHAGOGASTRODUODENOSCOPY (EGD) WITH PROPOFOL N/A 01/10/2016   Procedure: ESOPHAGOGASTRODUODENOSCOPY (EGD) WITH PROPOFOL;  Surgeon: [name], MD;  Location: [facility];  Service: Endoscopy;  Laterality: N/A;   EYE SURGERY Bilateral 2012   cataract/bilateral vitrectomies   LEFT HEART CATH AND CORONARY ANGIOGRAPHY Left 04/25/2018   Procedure: LEFT HEART CATH AND CORONARY ANGIOGRAPHY;  Surgeon: [name], MD;  Location: [facility];  Service: Cardiovascular;  Laterality: Left;   TEE WITHOUT CARDIOVERSION N/A 09/05/2018   Procedure: TRANSESOPHAGEAL ECHOCARDIOGRAM (TEE);  Surgeon: [name], MD;  Location: [facility];  Service: Cardiovascular;  Laterality: N/A;   TONSILLECTOMY AND ADENOIDECTOMY     As a child   TUNNELED VENOUS CATHETER PLACEMENT     removed    Family History  Problem Relation Age of Onset   Diabetes Mother    Heart disease Mother    Cancer Father        Prostate CA, Anal cancer    Dementia Father    Diabetes Brother    Healthy Brother    Healthy Brother     Social History   Tobacco Use   Smoking status: Never   Smokeless tobacco: Never   Tobacco comments:    smoking cessation materials not required  Substance Use Topics   Alcohol use: Not Currently    Alcohol/week: 0.0 standard drinks     Current Outpatient Medications:    albuterol (PROVENTIL HFA;VENTOLIN HFA) 108 (90 Base)  MCG/ACT inhaler, Inhale 2 puffs into the lungs every 6 (six) hours as needed for wheezing or shortness of breath. , Disp: , Rfl:    albuterol (PROVENTIL) (2.5 MG/3ML) 0.083% nebulizer solution, Take 2.5 mg by nebulization every 6 (six) hours as needed for wheezing or shortness of breath. , Disp: , Rfl:    aspirin EC 81 MG tablet, Take 1 tablet (81 mg total) by mouth daily., Disp: 90 tablet, Rfl: 3   clopidogrel (PLAVIX) 75 MG tablet, TAKE 1 TABLET(75 MG) BY MOUTH DAILY WITH BREAKFAST, Disp: 30 tablet, Rfl: 2   cyclobenzaprine (FLEXERIL) 10 MG tablet, Take 10 mg by mouth 2 (two) times daily as needed., Disp: , Rfl:    dexlansoprazole (DEXILANT) 60 MG capsule, Take 1 capsule (60 mg total) by mouth daily., Disp: 30 capsule, Rfl: 11   DULoxetine (CYMBALTA) 60 MG capsule, TAKE 1 CAPSULE(60 MG) BY MOUTH DAILY, Disp: 90 capsule, Rfl: 0   empagliflozin (JARDIANCE) 10 MG TABS  tablet, Take 1 tablet (10 mg total) by mouth daily before breakfast., Disp: 30 tablet, Rfl: 2   Fluticasone-Umeclidin-Vilant (TRELEGY ELLIPTA) 100-62.5-25 MCG/INH AEPB, Inhale 1 each into the lungs daily. In place of breo, Disp: 60 each, Rfl: 2   furosemide (LASIX) 20 MG tablet, TAKE 1 TABLET BY MOUTH DAILY AS NEEDED( FOR WEIGHT GAIN OF 2 POUNDS OVERNIGHT OR 5 POUNDS IN 1 WEEK), Disp: 90 tablet, Rfl: 0   gabapentin (NEURONTIN) 300 MG capsule, Take 1 capsule (300 mg total) by mouth 3 (three) times daily., Disp: 90 capsule, Rfl: 1   gabapentin (NEURONTIN) 800 MG tablet, Take 800 mg by mouth at bedtime., Disp: , Rfl:    insulin lispro (HUMALOG) 100 UNIT/ML KiwkPen, Inject 7-8 Units into the skin 3 (three) times daily. Reported on 12/02/2015/ sliding scale 1 unit for every 7 units of carbs; and 1 unit for every 20 above 120, Disp: , Rfl:    Insulin Pen Needle 32G X 4 MM MISC, Inject 1 each into the skin as needed., Disp: , Rfl:    LANTUS SOLOSTAR 100 UNIT/ML Solostar Pen, Inject 31-33 Units into the skin daily at 10 pm., Disp: , Rfl: 0    levocetirizine (XYZAL) 5 MG tablet, TAKE 1 TABLET(5 MG) BY MOUTH EVERY EVENING, Disp: 30 tablet, Rfl: 3   levothyroxine (SYNTHROID) 50 MCG tablet, Take 1 tablet (50 mcg total) by mouth daily before breakfast. One daily and two on Sundays - recheck level in 6 weeks, Disp: 32 tablet, Rfl: 1   loratadine (CLARITIN) 10 MG tablet, Take 10 mg by mouth daily. , Disp: , Rfl:    metFORMIN (GLUCOPHAGE) 1000 MG tablet, Take 1,000 mg by mouth 2 (two) times daily with a meal., Disp: , Rfl:    metoprolol succinate (TOPROL-XL) 100 MG 24 hr tablet, TAKE 1 TABLET(100 MG) BY MOUTH DAILY WITH OR IMMEDIATELY FOLLOWING A MEAL, Disp: 90 tablet, Rfl: 2   montelukast (SINGULAIR) 10 MG tablet, Take 10 mg by mouth at bedtime., Disp: , Rfl:    Multiple Vitamins-Minerals (MENS MULTI VITAMIN & MINERAL PO), Take 1 tablet by mouth in the morning and at bedtime., Disp: , Rfl:    Na Sulfate-K Sulfate-Mg Sulf (SUPREP BOWEL PREP KIT) 17.5-3.13-1.6 GM/177ML SOLN, Take 1 kit by mouth as directed., Disp: 354 mL, Rfl: 0   naloxone (NARCAN) nasal spray 4 mg/0.1 mL, For excess sedation from opioids, Disp: 1 kit, Rfl: 2   nitroGLYCERIN (NITROSTAT) 0.3 MG SL tablet, Place 1 tablet (0.3 mg total) under the tongue every 5 (five) minutes as needed for chest pain. Maximum 3 doses., Disp: 25 tablet, Rfl: 3   Oxycodone HCl 10 MG TABS, Take 1 tablet (10 mg total) by mouth in the morning, at noon, in the evening, and at bedtime., Disp: 120 tablet, Rfl: 0   [START ON 04/07/2021] Oxycodone HCl 10 MG TABS, Take 1 tablet (10 mg total) by mouth in the morning, at noon, in the evening, and at bedtime., Disp: 120 tablet, Rfl: 0   rosuvastatin (CRESTOR) 5 MG tablet, Take 5 mg by mouth daily., Disp: , Rfl: 0   Semaglutide,0.25 or 0.5MG /DOS, 2 MG/1.5ML SOPN, Inject 1 mg into the skin once a week., Disp: , Rfl:    tamsulosin (FLOMAX) 0.4 MG CAPS capsule, TAKE 2 CAPSULES BY MOUTH DAILY, Disp: 60 capsule, Rfl: 2   ezetimibe (ZETIA) 10 MG tablet, Take 1 tablet (10 mg  total) by mouth daily., Disp: 90 tablet, Rfl: 2  Allergies  Allergen Reactions  Azathioprine Other (See Comments)    Azathioprine hypersensitivity reaction - Symptoms mimicking sepsis - was hospitalized   Novolog [Insulin Aspart] Hives    I personally reviewed active problem list, medication list, allergies, family history, social history, health maintenance with the patient/caregiver today.   ROS  Constitutional: Negative for fever or weight change.  Respiratory: Negative for cough , positive for intermittent shortness of breath.   Cardiovascular: positive  for intermittent chest pain but no palpitations.  Gastrointestinal: Negative for abdominal pain, no bowel changes.  Musculoskeletal: positive  for gait problem but no  joint swelling.  Skin: Negative for rash.  Neurological: Negative for dizziness or headache.  No other specific complaints in a complete review of systems (except as listed in HPI above).   Objective  Vitals:   04/02/21 1349  BP: 108/72  Pulse: 79  Resp: 16  Temp: 98.1 F (36.7 C)  TempSrc: Oral  SpO2: 100%  Weight: 233 lb (105.7 kg)  Height: $Remove'5\' 7"'zcbgBzS$  (1.702 m)    Body mass index is 36.49 kg/m.  Physical Exam  Constitutional: Patient appears well-developed and well-nourished. Obese  No distress.  HEENT: head atraumatic, normocephalic, pupils equal and reactive to light, neck supple, ptosis of left eye  Cardiovascular: Normal rate, regular rhythm and normal heart sounds.  No murmur heard. No BLE edema. Pulmonary/Chest: Effort normal and breath sounds normal. No respiratory distress. Abdominal: Soft.  There is no tenderness. Psychiatric: Patient has a normal mood and affect. behavior is normal. Judgment and thought content normal.   Recent Results (from the past 2160 hour(s))  Hemoglobin A1c     Status: None   Collection Time: 01/29/21 12:00 AM  Result Value Ref Range   Hemoglobin A1C 7.9     PHQ2/9: Depression screen [facility] 2/9 04/02/2021  09/16/2020 09/11/2020 07/23/2020 06/18/2020  Decreased Interest 0 1 0 0 0  Down, Depressed, Hopeless 1 1 0 0 0  PHQ - 2 Score 1 2 0 0 0  Altered sleeping 2 1 - - -  Tired, decreased energy 0 1 - - -  Change in appetite 1 2 - - -  Feeling bad or failure about yourself  0 0 - - -  Trouble concentrating 0 1 - - -  Moving slowly or fidgety/restless 0 1 - - -  Suicidal thoughts 0 0 - - -  PHQ-9 Score 4 8 - - -  Difficult doing work/chores - - - - -  Some recent data might be hidden    phq 9 is positive   Fall Risk: Fall Risk  04/02/2021 01/07/2021 09/16/2020 09/11/2020 07/23/2020  Falls in the past year? 0 $Remov'1 1 1 1  'LbhUyZ$ Comment - - - - -  Number falls in past yr: 0 0 0 0 0  Comment - - - - -  Injury with Fall? 0 $Remov'1 1 1 1  'PIefZM$ Comment - While sick throwing up and hit face on shower - went to ED -  Risk Factor Category  - - - - -  Risk for fall due to : - Impaired balance/gait - Other (Comment) Impaired balance/gait;History of fall(s);Orthopedic patient  Risk for fall due to: Comment - - - - -  Follow up - Education provided;Falls prevention discussed - Falls prevention discussed Falls prevention discussed      Functional Status Survey: Is the patient deaf or have difficulty hearing?: Yes Does the patient have difficulty seeing, even when wearing glasses/contacts?: Yes Does the patient have difficulty concentrating, remembering, or  making decisions?: No Does the patient have difficulty walking or climbing stairs?: Yes Does the patient have difficulty dressing or bathing?: No Does the patient have difficulty doing errands alone such as visiting a doctor's office or shopping?: Yes    Assessment & Plan  1. Hypothyroidism, acquired, autoimmune  - TSH  2. Unstable angina ([place])   3. Myasthenia gravis ([place])   4. Moderate persistent asthmatic bronchitis  uncomplicated    5. Hyperlipidemia associated with type 2 diabetes mellitus (HCC)  - Urine Microalbumin w/creat. ratio  6. Chronic  combined systolic and diastolic CHF (congestive heart failure) ([place])   7. Coronary artery disease of native artery of native heart with stable angina pectoris ([place])   8. Atherosclerosis of aorta ([place])   9. Morbid obesity ([place])   10. Moderate episode of recurrent major depressive disorder (HCC)  - DULoxetine (CYMBALTA) 60 MG capsule; Take 1 capsule (60 mg total) by mouth daily.  Dispense: 90 capsule; Refill: 1  11. GAD (generalized anxiety disorder)  - DULoxetine (CYMBALTA) 60 MG capsule; Take 1 capsule (60 mg total) by mouth daily.  Dispense: 90 capsule; Refill: 1  12. Vitamin D deficiency   13. B12 deficiency  - B12 and Folate Panel  14. Anemia, unspecified type  - CBC with Differential/Platelet - B12 and Folate Panel - Iron, TIBC and Ferritin Panel  15. Controlled type 2 diabetes mellitus with stage 3 chronic kidney disease, with long-term current use of insulin (HCC)  - Urine Microalbumin w/creat. ratio  16. Need for shingles vaccine  - Zoster Vaccine Adjuvanted [facility]) injection; Inject 0.5 mLs into the muscle once for 1 dose. Repeat in 2 months  Dispense: 0.5 mL; Refill: 1

## 2021-04-02 ENCOUNTER — Encounter: Payer: Self-pay | Admitting: Family Medicine

## 2021-04-02 ENCOUNTER — Other Ambulatory Visit: Payer: Self-pay

## 2021-04-02 ENCOUNTER — Ambulatory Visit (INDEPENDENT_AMBULATORY_CARE_PROVIDER_SITE_OTHER): Payer: Medicare Other | Admitting: Family Medicine

## 2021-04-02 ENCOUNTER — Ambulatory Visit: Payer: Medicare Other | Admitting: Anesthesiology

## 2021-04-02 VITALS — BP 108/72 | HR 79 | Temp 98.1°F | Resp 16 | Ht 67.0 in | Wt 233.0 lb

## 2021-04-02 DIAGNOSIS — Z23 Encounter for immunization: Secondary | ICD-10-CM

## 2021-04-02 DIAGNOSIS — E559 Vitamin D deficiency, unspecified: Secondary | ICD-10-CM

## 2021-04-02 DIAGNOSIS — E1122 Type 2 diabetes mellitus with diabetic chronic kidney disease: Secondary | ICD-10-CM

## 2021-04-02 DIAGNOSIS — E063 Autoimmune thyroiditis: Secondary | ICD-10-CM

## 2021-04-02 DIAGNOSIS — E1169 Type 2 diabetes mellitus with other specified complication: Secondary | ICD-10-CM | POA: Diagnosis not present

## 2021-04-02 DIAGNOSIS — I200 Unstable angina: Secondary | ICD-10-CM | POA: Diagnosis not present

## 2021-04-02 DIAGNOSIS — I5042 Chronic combined systolic (congestive) and diastolic (congestive) heart failure: Secondary | ICD-10-CM | POA: Diagnosis not present

## 2021-04-02 DIAGNOSIS — D649 Anemia, unspecified: Secondary | ICD-10-CM

## 2021-04-02 DIAGNOSIS — J4540 Moderate persistent asthma, uncomplicated: Secondary | ICD-10-CM

## 2021-04-02 DIAGNOSIS — F331 Major depressive disorder, recurrent, moderate: Secondary | ICD-10-CM

## 2021-04-02 DIAGNOSIS — G7000 Myasthenia gravis without (acute) exacerbation: Secondary | ICD-10-CM | POA: Diagnosis not present

## 2021-04-02 DIAGNOSIS — E785 Hyperlipidemia, unspecified: Secondary | ICD-10-CM

## 2021-04-02 DIAGNOSIS — E538 Deficiency of other specified B group vitamins: Secondary | ICD-10-CM | POA: Diagnosis not present

## 2021-04-02 DIAGNOSIS — I700 Atherosclerosis of aorta: Secondary | ICD-10-CM | POA: Diagnosis not present

## 2021-04-02 DIAGNOSIS — N183 Chronic kidney disease, stage 3 unspecified: Secondary | ICD-10-CM

## 2021-04-02 DIAGNOSIS — I25118 Atherosclerotic heart disease of native coronary artery with other forms of angina pectoris: Secondary | ICD-10-CM | POA: Diagnosis not present

## 2021-04-02 DIAGNOSIS — F411 Generalized anxiety disorder: Secondary | ICD-10-CM

## 2021-04-02 DIAGNOSIS — Z794 Long term (current) use of insulin: Secondary | ICD-10-CM

## 2021-04-02 MED ORDER — SHINGRIX 50 MCG/0.5ML IM SUSR: 0.5000 mL | Freq: Once | INTRAMUSCULAR | 1 refills | Status: AC

## 2021-04-02 MED ORDER — DULOXETINE HCL 60 MG PO CPEP: 60.0000 mg | ORAL_CAPSULE | Freq: Every day | ORAL | 1 refills | Status: DC

## 2021-04-03 LAB — CBC WITH DIFFERENTIAL/PLATELET
Absolute Monocytes: 697 cells/uL (ref 200–950)
Basophils Absolute: 32 cells/uL (ref 0–200)
Basophils Relative: 0.4 %
Eosinophils Absolute: 381 cells/uL (ref 15–500)
Eosinophils Relative: 4.7 %
HCT: 37.7 % — ABNORMAL LOW (ref 38.5–50.0)
Hemoglobin: 11.7 g/dL — ABNORMAL LOW (ref 13.2–17.1)
Lymphs Abs: 1847 cells/uL (ref 850–3900)
MCH: 26.2 pg — ABNORMAL LOW (ref 27.0–33.0)
MCHC: 31 g/dL — ABNORMAL LOW (ref 32.0–36.0)
MCV: 84.5 fL (ref 80.0–100.0)
MPV: 9.9 fL (ref 7.5–12.5)
Monocytes Relative: 8.6 %
Neutro Abs: 5144 cells/uL (ref 1500–7800)
Neutrophils Relative %: 63.5 %
Platelets: 247 10*3/uL (ref 140–400)
RBC: 4.46 10*6/uL (ref 4.20–5.80)
RDW: 14.9 % (ref 11.0–15.0)
Total Lymphocyte: 22.8 %
WBC: 8.1 10*3/uL (ref 3.8–10.8)

## 2021-04-03 LAB — IRON,TIBC AND FERRITIN PANEL
%SAT: 8 % (calc) — ABNORMAL LOW (ref 20–48)
Ferritin: 6 ng/mL — ABNORMAL LOW (ref 24–380)
Iron: 31 ug/dL — ABNORMAL LOW (ref 50–180)
TIBC: 388 mcg/dL (calc) (ref 250–425)

## 2021-04-03 LAB — B12 AND FOLATE PANEL
Folate: 14.4 ng/mL
Vitamin B-12: 469 pg/mL (ref 200–1100)

## 2021-04-03 LAB — TSH: TSH: 1.41 mIU/L (ref 0.40–4.50)

## 2021-04-04 ENCOUNTER — Encounter: Payer: Self-pay | Admitting: Family Medicine

## 2021-04-08 ENCOUNTER — Other Ambulatory Visit: Payer: Self-pay

## 2021-04-14 ENCOUNTER — Encounter: Payer: Self-pay | Admitting: Gastroenterology

## 2021-04-14 ENCOUNTER — Ambulatory Visit: Payer: Medicare Other | Admitting: Anesthesiology

## 2021-04-14 ENCOUNTER — Other Ambulatory Visit: Payer: Self-pay

## 2021-04-14 ENCOUNTER — Ambulatory Visit
Admission: RE | Admit: 2021-04-14 | Discharge: 2021-04-14 | Disposition: A | Discharge status: Home / Self Care | Payer: Medicare Other | Attending: Gastroenterology | Admitting: Gastroenterology

## 2021-04-14 ENCOUNTER — Ambulatory Visit
Admission: RE | Disposition: A | Discharge status: Home / Self Care | Payer: Self-pay | Source: Home / Self Care | Attending: Gastroenterology

## 2021-04-14 DIAGNOSIS — K648 Other hemorrhoids: Secondary | ICD-10-CM | POA: Diagnosis not present

## 2021-04-14 DIAGNOSIS — Z7902 Long term (current) use of antithrombotics/antiplatelets: Secondary | ICD-10-CM | POA: Insufficient documentation

## 2021-04-14 DIAGNOSIS — Z7982 Long term (current) use of aspirin: Secondary | ICD-10-CM | POA: Insufficient documentation

## 2021-04-14 DIAGNOSIS — Z794 Long term (current) use of insulin: Secondary | ICD-10-CM | POA: Insufficient documentation

## 2021-04-14 DIAGNOSIS — Z9981 Dependence on supplemental oxygen: Secondary | ICD-10-CM | POA: Diagnosis not present

## 2021-04-14 DIAGNOSIS — Z888 Allergy status to other drugs, medicaments and biological substances status: Secondary | ICD-10-CM | POA: Insufficient documentation

## 2021-04-14 DIAGNOSIS — Z7951 Long term (current) use of inhaled steroids: Secondary | ICD-10-CM | POA: Diagnosis not present

## 2021-04-14 DIAGNOSIS — D509 Iron deficiency anemia, unspecified: Secondary | ICD-10-CM | POA: Diagnosis not present

## 2021-04-14 DIAGNOSIS — Z79890 Hormone replacement therapy (postmenopausal): Secondary | ICD-10-CM | POA: Insufficient documentation

## 2021-04-14 DIAGNOSIS — Z86010 Personal history of colonic polyps: Secondary | ICD-10-CM

## 2021-04-14 DIAGNOSIS — D500 Iron deficiency anemia secondary to blood loss (chronic): Secondary | ICD-10-CM

## 2021-04-14 DIAGNOSIS — Z79899 Other long term (current) drug therapy: Secondary | ICD-10-CM | POA: Diagnosis not present

## 2021-04-14 DIAGNOSIS — K2970 Gastritis, unspecified, without bleeding: Secondary | ICD-10-CM | POA: Diagnosis not present

## 2021-04-14 DIAGNOSIS — Z955 Presence of coronary angioplasty implant and graft: Secondary | ICD-10-CM | POA: Insufficient documentation

## 2021-04-14 DIAGNOSIS — Z7984 Long term (current) use of oral hypoglycemic drugs: Secondary | ICD-10-CM | POA: Insufficient documentation

## 2021-04-14 DIAGNOSIS — K2980 Duodenitis without bleeding: Secondary | ICD-10-CM | POA: Diagnosis not present

## 2021-04-14 DIAGNOSIS — K2950 Unspecified chronic gastritis without bleeding: Secondary | ICD-10-CM | POA: Diagnosis not present

## 2021-04-14 DIAGNOSIS — K2900 Acute gastritis without bleeding: Secondary | ICD-10-CM | POA: Diagnosis not present

## 2021-04-14 HISTORY — PX: COLONOSCOPY WITH PROPOFOL: SHX5780

## 2021-04-14 HISTORY — DX: Thyrotoxicosis, unspecified without thyrotoxic crisis or storm: E05.90

## 2021-04-14 HISTORY — PX: ESOPHAGOGASTRODUODENOSCOPY (EGD) WITH PROPOFOL: SHX5813

## 2021-04-14 HISTORY — DX: Presence of external hearing-aid: Z97.4

## 2021-04-14 HISTORY — DX: Unspecified hearing loss, left ear: H91.92

## 2021-04-14 LAB — GLUCOSE, CAPILLARY
Glucose-Capillary: 106 mg/dL — ABNORMAL HIGH (ref 70–99)
Glucose-Capillary: 98 mg/dL (ref 70–99)

## 2021-04-14 SURGERY — COLONOSCOPY WITH PROPOFOL
Anesthesia: General | Site: Mouth

## 2021-04-14 MED ORDER — PROPOFOL 10 MG/ML IV BOLUS
INTRAVENOUS | Status: DC | PRN
  Administered 2021-04-14: 80 mg via INTRAVENOUS
  Administered 2021-04-14: 40 mg via INTRAVENOUS
  Administered 2021-04-14: 60 mg via INTRAVENOUS
  Administered 2021-04-14: 40 mg via INTRAVENOUS
  Administered 2021-04-14: 30 mg via INTRAVENOUS

## 2021-04-14 MED ORDER — ONDANSETRON HCL 4 MG/2ML IJ SOLN: 4.0000 mg | Freq: Once | INTRAMUSCULAR | Status: DC | PRN

## 2021-04-14 MED ORDER — STERILE WATER FOR IRRIGATION IR SOLN: Status: DC | PRN

## 2021-04-14 MED ORDER — SODIUM CHLORIDE 0.9 % IV SOLN: INTRAVENOUS | Status: DC

## 2021-04-14 MED ORDER — THROMBIN 5000 UNITS EX SOLR
CUTANEOUS | Status: AC
  Filled 2021-04-14: qty 5000

## 2021-04-14 MED ORDER — LACTATED RINGERS IV SOLN: INTRAVENOUS | Status: DC

## 2021-04-14 MED ORDER — GLYCOPYRROLATE 0.2 MG/ML IJ SOLN
INTRAMUSCULAR | Status: DC | PRN
  Administered 2021-04-14: .1 mg via INTRAVENOUS

## 2021-04-14 MED ORDER — ACETAMINOPHEN 160 MG/5ML PO SOLN: 325.0000 mg | ORAL | Status: DC | PRN

## 2021-04-14 MED ORDER — ACETAMINOPHEN 325 MG PO TABS: 650.0000 mg | ORAL_TABLET | Freq: Once | ORAL | Status: DC | PRN

## 2021-04-14 SURGICAL SUPPLY — 8 items
BLOCK BITE 60FR ADLT L/F GRN (MISCELLANEOUS) ×3 IMPLANT
FORCEPS BIOP RAD 4 LRG CAP 4 (CUTTING FORCEPS) ×3 IMPLANT
GOWN CVR UNV OPN BCK APRN NK (MISCELLANEOUS) ×4 IMPLANT
GOWN ISOL THUMB LOOP REG UNIV (MISCELLANEOUS) ×6
KIT PRC NS LF DISP ENDO (KITS) ×2 IMPLANT
KIT PROCEDURE OLYMPUS (KITS) ×3
MANIFOLD NEPTUNE II (INSTRUMENTS) ×3 IMPLANT
WATER STERILE IRR 250ML POUR (IV SOLUTION) ×3 IMPLANT

## 2021-04-14 NOTE — Op Note (Signed)
[facility] Patient Name: [name] Procedure Date: 04/14/2021 7:54 AM MRN: [id number] Account #: [id number] Date of Birth: 03-29-1961 Admit Type: Outpatient Age: 60 Room: [facility] OR ROOM 01 Gender: Male Note Status: Finalized Procedure:             Colonoscopy Indications:           Iron deficiency anemia Providers:             [name] MD, MD Medicines:             Propofol per Anesthesia Complications:         No immediate complications. Procedure:             Pre-Anesthesia Assessment:                        - Prior to the procedure, a History and Physical was                         performed, and patient medications and allergies were                         reviewed. The patient's tolerance of previous                         anesthesia was also reviewed. The risks and benefits                         of the procedure and the sedation options and risks                         were discussed with the patient. All questions were                         answered, and informed consent was obtained. Prior                         Anticoagulants: The patient has taken no previous                         anticoagulant or antiplatelet agents. ASA Grade                         Assessment: II - A patient with mild systemic disease.                         After reviewing the risks and benefits, the patient                         was deemed in satisfactory condition to undergo the                         procedure.                        After obtaining informed consent, the colonoscope was                         passed under direct vision. Throughout the procedure,  the patient's blood pressure, pulse, and oxygen                         saturations were monitored continuously. The                         Colonoscope was introduced through the anus and                         advanced to the the cecum, identified by  appendiceal                         orifice and ileocecal valve. The colonoscopy was                         performed without difficulty. The patient tolerated                         the procedure well. The quality of the bowel                         preparation was excellent. Findings:      The perianal and digital rectal examinations were normal.      Non-bleeding internal hemorrhoids were found during retroflexion. The       hemorrhoids were Grade II (internal hemorrhoids that prolapse but reduce       spontaneously). Impression:            - Non-bleeding internal hemorrhoids.                        - No specimens collected. Recommendation:        - Discharge patient to home.                        - Resume previous diet.                        - Continue present medications.                        - To visualize the small bowel, perform video capsule                         endoscopy at appointment to be scheduled.                        - Repeat colonoscopy in 7 years for surveillance. Procedure Code(s):     --- Professional ---                        [phone], Colonoscopy, flexible; diagnostic, including                         collection of specimen(s) by brushing or washing, when                         performed (separate procedure) CPT copyright 2019 [employer]. All rights reserved. The codes documented in this report are preliminary and upon coder review may  be revised to meet current compliance requirements. [name]  Davinci Glotfelty MD, MD 04/14/2021 8:29:42 AM This report has been signed electronically. Number of Addenda: 0 Note Initiated On: 04/14/2021 7:54 AM Scope Withdrawal Time: 0 hours 9 minutes 15 seconds  Total Procedure Duration: 0 hours 16 minutes 38 seconds  Estimated Blood Loss:  Estimated blood loss: none.      [facility]

## 2021-04-14 NOTE — Op Note (Signed)
[facility] Gastroenterology Patient Name: [name] Procedure Date: 04/14/2021 7:55 AM MRN: [id number] Account #: [id number] Date of Birth: 05-Feb-1961 Admit Type: Outpatient Age: 60 Room: [facility] OR ROOM 01 Gender: Male Note Status: Finalized Procedure:             Upper GI endoscopy Indications:           Iron deficiency anemia Providers:             [name] MD, MD Referring MD:          Bethena Roys. Sowles, MD (Referring MD) Medicines:             Propofol per Anesthesia Complications:         No immediate complications. Procedure:             Pre-Anesthesia Assessment:                        - Prior to the procedure, a History and Physical was                         performed, and patient medications and allergies were                         reviewed. The patient's tolerance of previous                         anesthesia was also reviewed. The risks and benefits                         of the procedure and the sedation options and risks                         were discussed with the patient. All questions were                         answered, and informed consent was obtained. Prior                         Anticoagulants: The patient has taken no previous                         anticoagulant or antiplatelet agents. ASA Grade                         Assessment: II - A patient with mild systemic disease.                         After reviewing the risks and benefits, the patient                         was deemed in satisfactory condition to undergo the                         procedure.                        After obtaining informed consent, the endoscope was  passed under direct vision. Throughout the procedure,                         the patient's blood pressure, pulse, and oxygen                         saturations were monitored continuously. The was                         introduced through the mouth, and advanced to the                          third part of duodenum. The upper GI endoscopy was                         accomplished without difficulty. The patient tolerated                         the procedure well. Findings:      The examined esophagus was normal.      Diffuse moderate inflammation characterized by erythema was found in the       gastric antrum. Biopsies were taken with a cold forceps for histology.      Diffuse mild inflammation characterized by erythema was found in the       entire duodenum. Biopsies were taken with a cold forceps for histology. Impression:            - Normal esophagus.                        - Gastritis. Biopsied.                        - Duodenitis. Biopsied. Recommendation:        - Discharge patient to home.                        - Resume previous diet.                        - Continue present medications.                        - Await pathology results.                        - Perform a colonoscopy today. Procedure Code(s):     --- Professional ---                        [phone], Esophagogastroduodenoscopy, flexible,                         transoral; with biopsy, single or multiple Diagnosis Code(s):     --- Professional ---                        D50.9, Iron deficiency anemia, unspecified                        K29.70, Gastritis, unspecified, without bleeding  K29.80, Duodenitis without bleeding CPT copyright 2019 American Medical Association. All rights reserved. The codes documented in this report are preliminary and upon coder review may  be revised to meet current compliance requirements. [name] MD, MD 04/14/2021 8:10:11 AM This report has been signed electronically. Number of Addenda: 0 Note Initiated On: 04/14/2021 7:55 AM Total Procedure Duration: 0 hours 3 minutes 56 seconds  Estimated Blood Loss:  Estimated blood loss: none.      [facility]

## 2021-04-14 NOTE — H&P (Signed)
Midge Minium, MD [facility] [address] [place], [place] [address] Phone:[phone] Fax : [phone]  Primary Care Physician:  [name], MD Primary Gastroenterologist:  Dr. Servando Snare  Pre-Procedure History & Physical: HPI:  [name] is a 60 y.o. male is here for an endoscopy and colonoscopy.   Past Medical History:  Diagnosis Date   Allergy    dust, seasonal (worse in the fall).   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 Followed by Pain Specialist in [place], back and neck   Asthma    BRONCHITIS   Cataract    First Dx in 2012   Chronic combined systolic and diastolic congestive heart failure (HCC)    a. 03/2018 Echo: EF 30-35%, ant, antlat, apical AK, Gr1 DD; b. 07/2018 Echo: EF 35-40%, anteroseptal, apical, and ant HK. Gr1 DD; c. 09/2019 TEE: EF 40-45%.   Coronary artery disease    a. Prior Ant MI->s/p multiple stents placed in the LAD and right coronary artery ([place]); b. 2016 Cath: reportedly nonobs dzs;  c. 04/2018 Cath/PCI: LM nl, LAD 20p, patent mid stent, LCX 15m(3.25x15 Sierra DES), OM1 nl, OM2 50, OM3 40 w/ patent stent, RCA 40p, 39m, 40d w/ patent stent in RPDA, RPAV 60, EF 25-35%. 2+MR; d. 02/2019 MV: Apical scar, no isch, EF 30-44%.   Deaf, left    Diabetes mellitus without complication (HCC)    TYPE 2   Diabetic peripheral neuropathy (HCC)    feet and hands   FUO (fever of unknown origin) 08/03/2018   GERD (gastroesophageal reflux disease)    Headache    muscle tension   Hyperlipidemia    Hypertension    CONTROLLED ON MEDS   Hyperthyroidism    Insomnia    Ischemic cardiomyopathy    a. 03/2018 Echo: EF 30-35%, ant, antlat, apical AK, Gr1 DD, mild MR, mildly dil LA; b. 07/2018 Echo: EF 35-40%; c. 09/2018 TEE: EF 40-45%, antsept, ant HK.   Knee pain, acute 05/06/2020   Left arm weakness 10/04/2019   Left leg weakness 12/01/2019   Lyme disease    Chronic   Myasthenia gravis (HCC)    (03/18/21 - no current treatment - "better than it has ever been" per pt)    Myocardial infarction (HCC) 2010   Seasonal allergies    Sepsis (HCC)    a.07/2018 - unknown source. TEE neg for veg 09/2019.   Sleep apnea    CPAP   Wears hearing aid in both ears     Past Surgical History:  Procedure Laterality Date   BILATERAL CARPAL TUNNEL RELEASE Bilateral L in 2012 and R in 2013   CARDIAC CATHETERIZATION     Several Caths, most recent in  March 2016.   COLONOSCOPY WITH PROPOFOL N/A 01/10/2016   Procedure: COLONOSCOPY WITH PROPOFOL;  Surgeon: Midge Minium, MD;  Location: [facility] ENDOSCOPY;  Service: Endoscopy;  Laterality: N/A;   CORONARY ANGIOPLASTY     CORONARY STENT INTERVENTION N/A 04/25/2018   Procedure: CORONARY STENT INTERVENTION;  Surgeon: Iran Ouch, MD;  Location: [facility] INVASIVE CV LAB;  Service: Cardiovascular;  Laterality: N/A;   ESOPHAGOGASTRODUODENOSCOPY (EGD) WITH PROPOFOL N/A 01/10/2016   Procedure: ESOPHAGOGASTRODUODENOSCOPY (EGD) WITH PROPOFOL;  Surgeon: Midge Minium, MD;  Location: [facility] ENDOSCOPY;  Service: Endoscopy;  Laterality: N/A;   EYE SURGERY Bilateral 2012   cataract/bilateral vitrectomies   LEFT HEART CATH AND CORONARY ANGIOGRAPHY Left 04/25/2018   Procedure: LEFT HEART CATH AND CORONARY ANGIOGRAPHY;  Surgeon: Iran Ouch, MD;  Location: [facility]  INVASIVE CV LAB;  Service: Cardiovascular;  Laterality: Left;   TEE WITHOUT CARDIOVERSION N/A 09/05/2018   Procedure: TRANSESOPHAGEAL ECHOCARDIOGRAM (TEE);  Surgeon: [name], MD;  Location: [facility] ORS;  Service: Cardiovascular;  Laterality: N/A;   TONSILLECTOMY AND ADENOIDECTOMY     As a child   TUNNELED VENOUS CATHETER PLACEMENT     removed    Prior to Admission medications   Medication Sig Start Date End Date Taking? Authorizing Provider  albuterol (PROVENTIL HFA;VENTOLIN HFA) 108 (90 Base) MCG/ACT inhaler Inhale 2 puffs into the lungs every 6 (six) hours as needed for wheezing or shortness of breath.    Yes [provider]  albuterol (PROVENTIL) (2.5 MG/3ML) 0.083%  nebulizer solution Take 2.5 mg by nebulization every 6 (six) hours as needed for wheezing or shortness of breath.    Yes [provider]  aspirin EC 81 MG tablet Take 1 tablet (81 mg total) by mouth daily. 03/28/18  Yes Theora Gianotti, NP  clopidogrel (PLAVIX) 75 MG tablet TAKE 1 TABLET(75 MG) BY MOUTH DAILY WITH BREAKFAST 08/07/20  Yes [name], MD  cyclobenzaprine (FLEXERIL) 10 MG tablet Take 10 mg by mouth 2 (two) times daily as needed. 11/01/20  Yes [provider]  dexlansoprazole (DEXILANT) 60 MG capsule Take 1 capsule (60 mg total) by mouth daily. 03/06/21  Yes [name], MD  DULoxetine (CYMBALTA) 60 MG capsule Take 1 capsule (60 mg total) by mouth daily. 04/02/21  Yes Sowles, Drue Stager, MD  empagliflozin (JARDIANCE) 10 MG TABS tablet Take 1 tablet (10 mg total) by mouth daily before breakfast. 12/09/20  Yes Loel Dubonnet, NP  ezetimibe (ZETIA) 10 MG tablet Take 1 tablet (10 mg total) by mouth daily. 09/04/20 04/14/21 Yes [name], MD  Fluticasone-Umeclidin-Vilant (TRELEGY ELLIPTA) 100-62.5-25 MCG/INH AEPB Inhale 1 each into the lungs daily. In place of breo 05/17/20  Yes Sowles, Drue Stager, MD  gabapentin (NEURONTIN) 300 MG capsule Take 1 capsule (300 mg total) by mouth 3 (three) times daily. 09/25/15  Yes Roselee Nova, MD  gabapentin (NEURONTIN) 800 MG tablet Take 800 mg by mouth at bedtime.   Yes Mehrabyan, Anahit Cheluskin, MD  insulin lispro (HUMALOG) 100 UNIT/ML KiwkPen Inject 7-8 Units into the skin 3 (three) times daily. Reported on 12/02/2015/ sliding scale 1 unit for every 7 units of carbs; and 1 unit for every 20 above 120   Yes [provider]  LANTUS SOLOSTAR 100 UNIT/ML Solostar Pen Inject 31-33 Units into the skin daily at 10 pm. 03/25/16  Yes Sherri Sear, MD  levocetirizine (XYZAL) 5 MG tablet TAKE 1 TABLET(5 MG) BY MOUTH EVERY EVENING 10/08/20  Yes Sowles, Drue Stager, MD  levothyroxine (SYNTHROID) 50 MCG tablet Take 1 tablet  (50 mcg total) by mouth daily before breakfast. One daily and two on Sundays - recheck level in 6 weeks 11/05/20 11/05/21 Yes Sowles, Drue Stager, MD  loratadine (CLARITIN) 10 MG tablet Take 10 mg by mouth daily.    Yes [provider]  metFORMIN (GLUCOPHAGE) 1000 MG tablet Take 1,000 mg by mouth 2 (two) times daily with a meal.   Yes Sherri Sear, MD  metoprolol succinate (TOPROL-XL) 100 MG 24 hr tablet TAKE 1 TABLET(100 MG) BY MOUTH DAILY WITH OR IMMEDIATELY FOLLOWING A MEAL 03/04/21  Yes Loel Dubonnet, NP  montelukast (SINGULAIR) 10 MG tablet Take 10 mg by mouth at bedtime. 12/02/18  Yes [provider]  Multiple Vitamins-Minerals (MENS MULTI VITAMIN & MINERAL PO) Take 1  tablet by mouth in the morning and at bedtime.   Yes [provider]  naloxone [facility]) nasal spray 4 mg/0.1 mL For excess sedation from opioids 09/15/16  Yes Yevette Edwards, MD  nitroGLYCERIN (NITROSTAT) 0.3 MG SL tablet Place 1 tablet (0.3 mg total) under the tongue every 5 (five) minutes as needed for chest pain. Maximum 3 doses. 08/18/18  Yes Creig Hines, NP  Oxycodone HCl 10 MG TABS Take 1 tablet (10 mg total) by mouth in the morning, at noon, in the evening, and at bedtime. 04/07/21 05/07/21 Yes Yevette Edwards, MD  rosuvastatin (CRESTOR) 5 MG tablet Take 5 mg by mouth daily. 11/20/17  Yes Iran Ouch, MD  Semaglutide,0.25 or 0.5MG /DOS, 2 MG/1.5ML SOPN Inject 1 mg into the skin once a week. 09/24/20  Yes [provider]  tamsulosin (FLOMAX) 0.4 MG CAPS capsule TAKE 2 CAPSULES BY MOUTH DAILY 06/03/20  Yes Stoioff, Scott C, MD  furosemide (LASIX) 20 MG tablet TAKE 1 TABLET BY MOUTH DAILY AS NEEDED( FOR WEIGHT GAIN OF 2 POUNDS OVERNIGHT OR 5 POUNDS IN 1 WEEK) 01/14/21   Alver Sorrow, NP  Insulin Pen Needle 32G X 4 MM MISC Inject 1 each into the skin as needed. 02/24/18   [provider]  Na Sulfate-K Sulfate-Mg Sulf (SUPREP BOWEL PREP KIT) 17.5-3.13-1.6 GM/177ML SOLN  Take 1 kit by mouth as directed. 03/06/21   Midge Minium, MD    Allergies as of 03/06/2021 - Review Complete 03/06/2021  Allergen Reaction Noted   Azathioprine Other (See Comments) 11/16/2018   Novolog [insulin aspart] Hives 09/24/2015    Family History  Problem Relation Age of Onset   Diabetes Mother    Heart disease Mother    Cancer Father        Prostate CA, Anal cancer    Dementia Father    Diabetes Brother    Healthy Brother    Healthy Brother     Social History   Socioeconomic History   Marital status: Married    Spouse name: Gavin Pound   Number of children: 0   Years of education: Not on file   Highest education level: Master's degree (e.g., MA, MS, MEng, MEd, MSW, MBA)  Occupational History   Occupation: disabled    Comment: multiple medical problems - first approved for uncontrolled DM, but now has heart disease and Myasthenia Gravis   Tobacco Use   Smoking status: Never   Smokeless tobacco: Never   Tobacco comments:    smoking cessation materials not required  Vaping Use   Vaping Use: Never used  Substance and Sexual Activity   Alcohol use: Not Currently    Alcohol/week: 0.0 standard drinks   Drug use: No   Sexual activity: Not Currently    Partners: Female  Other Topics Concern   Not on file  Social History Narrative   Not on file   Social Determinants of Health   Financial Resource Strain: High Risk   Difficulty of Paying Living Expenses: Hard  Food Insecurity: No Food Insecurity   Worried About Running Out of Food in the Last Year: Never true   Ran Out of Food in the Last Year: Never true  Transportation Needs: No Transportation Needs   Lack of Transportation (Medical): No   Lack of Transportation (Non-Medical): No  Physical Activity: Insufficiently Active   Days of Exercise per Week: 3 days   Minutes of Exercise per Session: 30 min  Stress: No Stress Concern Present  Feeling of Stress : Only a little  Social Connections: Psychologist, educational of Communication with Friends and Family: More than three times a week   Frequency of Social Gatherings with Friends and Family: Three times a week   Attends Religious Services: More than 4 times per year   Active Member of Clubs or Organizations: Yes   Attends Music therapist: More than 4 times per year   Marital Status: Married  Human resources officer Violence: Not At Risk   Fear of Current or Ex-Partner: No   Emotionally Abused: No   Physically Abused: No   Sexually Abused: No    Review of Systems: See HPI, otherwise negative ROS  Physical Exam: BP (!) 134/1   Pulse 90   Temp (!) 97 F (36.1 C) (Temporal)   Resp 16   Ht $R'5\' 7"'Ki$  (1.702 m)   Wt 101.6 kg   SpO2 96%   BMI 35.08 kg/m  General:   Alert,  pleasant and cooperative in NAD Head:  Normocephalic and atraumatic. Neck:  Supple; no masses or thyromegaly. Lungs:  Clear throughout to auscultation.    Heart:  Regular rate and rhythm. Abdomen:  Soft, nontender and nondistended. Normal bowel sounds, without guarding, and without rebound.   Neurologic:  Alert and  oriented x4;  grossly normal neurologically.  Impression/Plan: [name] is here for an endoscopy and colonoscopy to be performed for low iron anemia  Risks, benefits, limitations, and alternatives regarding  endoscopy and colonoscopy have been reviewed with the patient.  Questions have been answered.  All parties agreeable.   [name], MD  04/14/2021, 7:19 AM

## 2021-04-14 NOTE — Anesthesia Postprocedure Evaluation (Signed)
Anesthesia Post Note  Patient: [name]  Procedure(s) Performed: COLONOSCOPY WITH PROPOFOL ESOPHAGOGASTRODUODENOSCOPY (EGD) WITH BIOPSY (Mouth)     Patient location during evaluation: PACU Anesthesia Type: General Level of consciousness: awake and alert, oriented and patient cooperative Pain management: pain level controlled Vital Signs Assessment: post-procedure vital signs reviewed and stable Respiratory status: spontaneous breathing, nonlabored ventilation and respiratory function stable Cardiovascular status: blood pressure returned to baseline and stable Postop Assessment: adequate PO intake Anesthetic complications: no   No notable events documented.  Darrin Nipper

## 2021-04-14 NOTE — Transfer of Care (Signed)
Immediate Anesthesia Transfer of Care Note  Patient: [name]  Procedure(s) Performed: COLONOSCOPY WITH PROPOFOL ESOPHAGOGASTRODUODENOSCOPY (EGD) WITH BIOPSY (Mouth)  Patient Location: PACU  Anesthesia Type: General  Level of Consciousness: awake, alert  and patient cooperative  Airway and Oxygen Therapy: Patient Spontanous Breathing and Patient connected to supplemental oxygen  Post-op Assessment: Post-op Vital signs reviewed, Patient's Cardiovascular Status Stable, Respiratory Function Stable, Patent Airway and No signs of Nausea or vomiting  Post-op Vital Signs: Reviewed and stable  Complications: No notable events documented.

## 2021-04-15 ENCOUNTER — Other Ambulatory Visit: Payer: Self-pay | Admitting: Anesthesiology

## 2021-04-15 ENCOUNTER — Encounter: Payer: Self-pay | Admitting: Anesthesiology

## 2021-04-15 ENCOUNTER — Ambulatory Visit
Admission: RE | Admit: 2021-04-15 | Discharge: 2021-04-15 | Disposition: A | Discharge status: Home / Self Care | Payer: Medicare Other | Source: Ambulatory Visit | Attending: Anesthesiology | Admitting: Anesthesiology

## 2021-04-15 ENCOUNTER — Ambulatory Visit (HOSPITAL_BASED_OUTPATIENT_CLINIC_OR_DEPARTMENT_OTHER): Payer: Medicare Other | Admitting: Anesthesiology

## 2021-04-15 VITALS — BP 112/79 | HR 87 | Temp 97.0°F | Resp 16 | Ht 67.0 in | Wt 228.0 lb

## 2021-04-15 DIAGNOSIS — F1190 Opioid use, unspecified, uncomplicated: Secondary | ICD-10-CM | POA: Diagnosis present

## 2021-04-15 DIAGNOSIS — M5136 Other intervertebral disc degeneration, lumbar region: Secondary | ICD-10-CM | POA: Insufficient documentation

## 2021-04-15 DIAGNOSIS — R29898 Other symptoms and signs involving the musculoskeletal system: Secondary | ICD-10-CM | POA: Insufficient documentation

## 2021-04-15 DIAGNOSIS — G7000 Myasthenia gravis without (acute) exacerbation: Secondary | ICD-10-CM | POA: Insufficient documentation

## 2021-04-15 DIAGNOSIS — M5432 Sciatica, left side: Secondary | ICD-10-CM

## 2021-04-15 DIAGNOSIS — R52 Pain, unspecified: Secondary | ICD-10-CM | POA: Insufficient documentation

## 2021-04-15 DIAGNOSIS — M4716 Other spondylosis with myelopathy, lumbar region: Secondary | ICD-10-CM

## 2021-04-15 DIAGNOSIS — M47817 Spondylosis without myelopathy or radiculopathy, lumbosacral region: Secondary | ICD-10-CM | POA: Insufficient documentation

## 2021-04-15 DIAGNOSIS — M542 Cervicalgia: Secondary | ICD-10-CM

## 2021-04-15 DIAGNOSIS — M51369 Other intervertebral disc degeneration, lumbar region without mention of lumbar back pain or lower extremity pain: Secondary | ICD-10-CM

## 2021-04-15 DIAGNOSIS — G894 Chronic pain syndrome: Secondary | ICD-10-CM

## 2021-04-15 MED ORDER — SODIUM CHLORIDE (PF) 0.9 % IJ SOLN
INTRAMUSCULAR | Status: AC
  Filled 2021-04-15: qty 10

## 2021-04-15 MED ORDER — ROPIVACAINE HCL 2 MG/ML IJ SOLN
INTRAMUSCULAR | Status: AC
  Filled 2021-04-15: qty 20

## 2021-04-15 MED ORDER — ROPIVACAINE HCL 2 MG/ML IJ SOLN
10.0000 mL | Freq: Once | INTRAMUSCULAR | Status: AC
  Administered 2021-04-15: 10 mL via EPIDURAL

## 2021-04-15 MED ORDER — SODIUM CHLORIDE 0.9% FLUSH
10.0000 mL | Freq: Once | INTRAVENOUS | Status: AC
  Administered 2021-04-15: 10 mL

## 2021-04-15 MED ORDER — OXYCODONE HCL 10 MG PO TABS: 10.0000 mg | ORAL_TABLET | Freq: Four times a day (QID) | ORAL | 0 refills | Status: AC

## 2021-04-15 MED ORDER — LIDOCAINE HCL (PF) 1 % IJ SOLN
5.0000 mL | Freq: Once | INTRAMUSCULAR | Status: AC
  Administered 2021-04-15: 5 mL via SUBCUTANEOUS

## 2021-04-15 MED ORDER — LIDOCAINE HCL (PF) 1 % IJ SOLN
INTRAMUSCULAR | Status: AC
  Filled 2021-04-15: qty 10

## 2021-04-15 MED ORDER — TRIAMCINOLONE ACETONIDE 40 MG/ML IJ SUSP
INTRAMUSCULAR | Status: AC
  Filled 2021-04-15: qty 1

## 2021-04-15 MED ORDER — IOHEXOL 180 MG/ML  SOLN
10.0000 mL | Freq: Once | INTRAMUSCULAR | Status: AC | PRN
  Administered 2021-04-15: 10 mL via EPIDURAL

## 2021-04-15 MED ORDER — TRIAMCINOLONE ACETONIDE 40 MG/ML IJ SUSP
40.0000 mg | Freq: Once | INTRAMUSCULAR | Status: AC
  Administered 2021-04-15: 40 mg

## 2021-04-15 NOTE — Progress Notes (Signed)
Subjective:  Patient ID: [name], male    DOB: 01/31/1961  Age: 60 y.o. MRN: [id number]  CC: Back Pain (Lower left)   Procedure: L5-S1 epidural steroid under fluoroscopic guidance with no sedation  HPI DALANTE MINUS presents for reevaluation.  He was last seen several weeks ago and previously has had epidurals in the past for his low back pain.  Most recently this was done back in December of last year.  He has had a flareup of his left lower leg sciatica and in the past has gotten good relief from the epidural steroid injections.  Otherwise he is in his usual state of health.  No changes in lower extremity strength or function is noted though he does have chronic lower extremity weakness.  His bowel bladder function has been stable as well.  He is taking his medications for pain relief and these continue to work well for him.  No side effects are reported.  He gets good relief approximately 50 to 75% with the opioid tablets.  He has failed more conservative therapy but this works well for him and he continues to do well.  He derives good functional benefit from the medicines he reports.  Outpatient Medications Prior to Visit  Medication Sig Dispense Refill   albuterol (PROVENTIL HFA;VENTOLIN HFA) 108 (90 Base) MCG/ACT inhaler Inhale 2 puffs into the lungs every 6 (six) hours as needed for wheezing or shortness of breath.      albuterol (PROVENTIL) (2.5 MG/3ML) 0.083% nebulizer solution Take 2.5 mg by nebulization every 6 (six) hours as needed for wheezing or shortness of breath.      aspirin EC 81 MG tablet Take 1 tablet (81 mg total) by mouth daily. 90 tablet 3   clopidogrel (PLAVIX) 75 MG tablet TAKE 1 TABLET(75 MG) BY MOUTH DAILY WITH BREAKFAST 30 tablet 2   cyclobenzaprine (FLEXERIL) 10 MG tablet Take 10 mg by mouth 2 (two) times daily as needed.     dexlansoprazole (DEXILANT) 60 MG capsule Take 1 capsule (60 mg total) by mouth daily. 30 capsule 11   DULoxetine (CYMBALTA) 60 MG  capsule Take 1 capsule (60 mg total) by mouth daily. 90 capsule 1   empagliflozin (JARDIANCE) 10 MG TABS tablet Take 1 tablet (10 mg total) by mouth daily before breakfast. 30 tablet 2   ezetimibe (ZETIA) 10 MG tablet Take 1 tablet (10 mg total) by mouth daily. 90 tablet 2   Fluticasone-Umeclidin-Vilant (TRELEGY ELLIPTA) 100-62.5-25 MCG/INH AEPB Inhale 1 each into the lungs daily. In place of breo 60 each 2   furosemide (LASIX) 20 MG tablet TAKE 1 TABLET BY MOUTH DAILY AS NEEDED( FOR WEIGHT GAIN OF 2 POUNDS OVERNIGHT OR 5 POUNDS IN 1 WEEK) 90 tablet 0   gabapentin (NEURONTIN) 300 MG capsule Take 1 capsule (300 mg total) by mouth 3 (three) times daily. 90 capsule 1   gabapentin (NEURONTIN) 800 MG tablet Take 800 mg by mouth at bedtime.     insulin lispro (HUMALOG) 100 UNIT/ML KiwkPen Inject 7-8 Units into the skin 3 (three) times daily. Reported on 12/02/2015/ sliding scale 1 unit for every 7 units of carbs; and 1 unit for every 20 above 120     Insulin Pen Needle 32G X 4 MM MISC Inject 1 each into the skin as needed.     LANTUS SOLOSTAR 100 UNIT/ML Solostar Pen Inject 31-33 Units into the skin daily at 10 pm.  0   levocetirizine (XYZAL) 5 MG tablet TAKE 1  TABLET(5 MG) BY MOUTH EVERY EVENING 30 tablet 3   levothyroxine (SYNTHROID) 50 MCG tablet Take 1 tablet (50 mcg total) by mouth daily before breakfast. One daily and two on Sundays - recheck level in 6 weeks 32 tablet 1   loratadine (CLARITIN) 10 MG tablet Take 10 mg by mouth daily.      metFORMIN (GLUCOPHAGE) 1000 MG tablet Take 1,000 mg by mouth 2 (two) times daily with a meal.     metoprolol succinate (TOPROL-XL) 100 MG 24 hr tablet TAKE 1 TABLET(100 MG) BY MOUTH DAILY WITH OR IMMEDIATELY FOLLOWING A MEAL 90 tablet 2   montelukast (SINGULAIR) 10 MG tablet Take 10 mg by mouth at bedtime.     montelukast (SINGULAIR) 10 MG tablet Take 1 tablet by mouth at bedtime.     Multiple Vitamins-Minerals (MENS MULTI VITAMIN & MINERAL PO) Take 1 tablet by  mouth in the morning and at bedtime.     Na Sulfate-K Sulfate-Mg Sulf (SUPREP BOWEL PREP KIT) 17.5-3.13-1.6 GM/177ML SOLN Take 1 kit by mouth as directed. 354 mL 0   naloxone (NARCAN) nasal spray 4 mg/0.1 mL For excess sedation from opioids 1 kit 2   nitroGLYCERIN (NITROSTAT) 0.3 MG SL tablet Place 1 tablet (0.3 mg total) under the tongue every 5 (five) minutes as needed for chest pain. Maximum 3 doses. 25 tablet 3   rosuvastatin (CRESTOR) 5 MG tablet Take 5 mg by mouth daily.  0   Semaglutide,0.25 or 0.5MG /DOS, 2 MG/1.5ML SOPN Inject 1 mg into the skin once a week.     tamsulosin (FLOMAX) 0.4 MG CAPS capsule TAKE 2 CAPSULES BY MOUTH DAILY 60 capsule 2   Oxycodone HCl 10 MG TABS Take 1 tablet (10 mg total) by mouth in the morning, at noon, in the evening, and at bedtime. 120 tablet 0   No facility-administered medications prior to visit.    Review of Systems CNS: No confusion or sedation Cardiac: No angina or palpitations GI: No abdominal pain or constipation Constitutional: No nausea vomiting fevers or chills  Objective:  BP 112/79   Pulse 87   Temp (!) 97 F (36.1 C)   Resp 16   Ht 5\' 7"  (1.702 m)   Wt 228 lb (103.4 kg)   SpO2 98%   BMI 35.71 kg/m    BP Readings from Last 3 Encounters:  04/15/21 112/79  04/14/21 121/67  04/02/21 108/72     Wt Readings from Last 3 Encounters:  04/15/21 228 lb (103.4 kg)  04/14/21 224 lb (101.6 kg)  04/02/21 233 lb (105.7 kg)     Physical Exam Pt is alert and oriented PERRL EOMI HEART IS RRR no murmur or rub LCTA no wheezing or rales MUSCULOSKELETAL reveals some paraspinous muscles in the lumbar region but no overt trigger points.  He walks with an antalgic gait.  His muscle tone and bulk is at baseline.  Labs  Lab Results  Component Value Date   HGBA1C 7.9 01/29/2021   HGBA1C 8.4 (A) 09/16/2020   HGBA1C 7.5 (H) 10/16/2019   Lab Results  Component Value Date   MICROALBUR 1.4 05/05/2019   LDLCALC 44 09/17/2020    CREATININE 1.22 12/25/2020    -------------------------------------------------------------------------------------------------------------------- Lab Results  Component Value Date   WBC 8.1 04/02/2021   HGB 11.7 (L) 04/02/2021   HCT 37.7 (L) 04/02/2021   PLT 247 04/02/2021   GLUCOSE 243 (H) 12/25/2020   CHOL 104 09/17/2020   TRIG 97 09/17/2020   HDL 41 09/17/2020  LDLCALC 44 09/17/2020   ALT 20 09/17/2020   AST 17 09/17/2020   NA 138 12/25/2020   K 4.9 12/25/2020   CL 104 12/25/2020   CREATININE 1.22 12/25/2020   BUN 25 (H) 12/25/2020   CO2 26 12/25/2020   TSH 1.41 04/02/2021   PSA 0.7 05/05/2019   INR 1.26 07/07/2018   HGBA1C 7.9 01/29/2021   MICROALBUR 1.4 05/05/2019    --------------------------------------------------------------------------------------------------------------------- DG PAIN CLINIC C-ARM 1-60 MIN NO REPORT  Result Date: 04/15/2021 Fluoro was used, but no Radiologist interpretation will be provided. Please refer to "NOTES" tab for provider progress note.    Assessment & Plan:   Brek was seen today for back pain.  Diagnoses and all orders for this visit:  Left leg weakness  Lumbar spondylosis with myelopathy  DDD (degenerative disc disease), lumbar  Chronic pain syndrome  Chronic, continuous use of opioids  Sciatica, left side  Cervicalgia  Facet arthritis of lumbosacral region  Myasthenia gravis ([place])  Other orders -     Oxycodone HCl 10 MG TABS; Take 1 tablet (10 mg total) by mouth in the morning, at noon, in the evening, and at bedtime. -     Oxycodone HCl 10 MG TABS; Take 1 tablet (10 mg total) by mouth in the morning, at noon, in the evening, and at bedtime. -     triamcinolone acetonide (KENALOG-40) injection 40 mg -     sodium chloride flush (NS) 0.9 % injection 10 mL -     ropivacaine (PF) 2 mg/mL (0.2%) (NAROPIN) injection 10 mL -     lidocaine (PF) (XYLOCAINE) 1 % injection 5 mL -     iohexol (OMNIPAQUE) 180 MG/ML  injection 10 mL        ----------------------------------------------------------------------------------------------------------------------  Problem List Items Addressed This Visit       Unprioritized   Left leg weakness - Primary   Myasthenia gravis ([place])   Other Visit Diagnoses     Lumbar spondylosis with myelopathy       Relevant Medications   Oxycodone HCl 10 MG TABS (Start on 05/08/2021)   Oxycodone HCl 10 MG TABS (Start on 06/07/2021)   triamcinolone acetonide (KENALOG-40) injection 40 mg (Completed)   DDD (degenerative disc disease), lumbar       Relevant Medications   Oxycodone HCl 10 MG TABS (Start on 05/08/2021)   Oxycodone HCl 10 MG TABS (Start on 06/07/2021)   triamcinolone acetonide (KENALOG-40) injection 40 mg (Completed)   Chronic pain syndrome       Relevant Medications   Oxycodone HCl 10 MG TABS (Start on 05/08/2021)   Oxycodone HCl 10 MG TABS (Start on 06/07/2021)   triamcinolone acetonide (KENALOG-40) injection 40 mg (Completed)   ropivacaine (PF) 2 mg/mL (0.2%) (NAROPIN) injection 10 mL (Completed)   lidocaine (PF) (XYLOCAINE) 1 % injection 5 mL (Completed)   Chronic, continuous use of opioids       Sciatica, left side       Cervicalgia       Facet arthritis of lumbosacral region       Relevant Medications   Oxycodone HCl 10 MG TABS (Start on 05/08/2021)   Oxycodone HCl 10 MG TABS (Start on 06/07/2021)   triamcinolone acetonide (KENALOG-40) injection 40 mg (Completed)         ----------------------------------------------------------------------------------------------------------------------  1. Left leg weakness Stable without change  2. Lumbar spondylosis with myelopathy We will proceed with a repeat epidural for the sciatica symptoms.  We have gone over the risks and  benefits of this with him in full detail and all questions are answered today.  We will schedule him for 27-month return to clinic as well.  Continue stretching strengthening exercises as  tolerated.  3. DDD (degenerative disc disease), lumbar As above  4. Chronic pain syndrome As above  5. Chronic, continuous use of opioids I have reviewed the [facility] practitioner database information is appropriate for refills.  He is scheduled for a repeat urine screen for routine purpose.  I do not see any evidence of any diverting or illicit use.  He seems to be doing well with this protocol and no changes will be made today.  6. Sciatica, left side As above  7. Cervicalgia Continue stretching strengthening exercises  8. Facet arthritis of lumbosacral region   9. Myasthenia gravis ([place])     ----------------------------------------------------------------------------------------------------------------------  I am having Dakotah M. Steffenhagen "[name]" start on Oxycodone HCl. I am also having him maintain his insulin lispro, loratadine, albuterol, gabapentin, metFORMIN, albuterol, Lantus SoloStar, naloxone, rosuvastatin, Insulin Pen Needle, aspirin EC, nitroGLYCERIN, gabapentin, montelukast, Multiple Vitamins-Minerals (MENS MULTI VITAMIN & MINERAL PO), Trelegy Ellipta, tamsulosin, clopidogrel, ezetimibe, levocetirizine, Semaglutide(0.25 or 0.5MG /DOS), levothyroxine, cyclobenzaprine, empagliflozin, furosemide, metoprolol succinate, Suprep Bowel Prep Kit, dexlansoprazole, DULoxetine, montelukast, and Oxycodone HCl. We administered triamcinolone acetonide, sodium chloride flush, ropivacaine (PF) 2 mg/mL (0.2%), lidocaine (PF), and iohexol.   Meds ordered this encounter  Medications   Oxycodone HCl 10 MG TABS    Sig: Take 1 tablet (10 mg total) by mouth in the morning, at noon, in the evening, and at bedtime.    Dispense:  120 tablet    Refill:  0   Oxycodone HCl 10 MG TABS    Sig: Take 1 tablet (10 mg total) by mouth in the morning, at noon, in the evening, and at bedtime.    Dispense:  120 tablet    Refill:  0   triamcinolone acetonide (KENALOG-40) injection 40 mg   sodium  chloride flush (NS) 0.9 % injection 10 mL   ropivacaine (PF) 2 mg/mL (0.2%) (NAROPIN) injection 10 mL   lidocaine (PF) (XYLOCAINE) 1 % injection 5 mL   iohexol (OMNIPAQUE) 180 MG/ML injection 10 mL   Patient's Medications  New Prescriptions   OXYCODONE HCL 10 MG TABS    Take 1 tablet (10 mg total) by mouth in the morning, at noon, in the evening, and at bedtime.  Previous Medications   ALBUTEROL (PROVENTIL HFA;VENTOLIN HFA) 108 (90 BASE) MCG/ACT INHALER    Inhale 2 puffs into the lungs every 6 (six) hours as needed for wheezing or shortness of breath.    ALBUTEROL (PROVENTIL) (2.5 MG/3ML) 0.083% NEBULIZER SOLUTION    Take 2.5 mg by nebulization every 6 (six) hours as needed for wheezing or shortness of breath.    ASPIRIN EC 81 MG TABLET    Take 1 tablet (81 mg total) by mouth daily.   CLOPIDOGREL (PLAVIX) 75 MG TABLET    TAKE 1 TABLET(75 MG) BY MOUTH DAILY WITH BREAKFAST   CYCLOBENZAPRINE (FLEXERIL) 10 MG TABLET    Take 10 mg by mouth 2 (two) times daily as needed.   DEXLANSOPRAZOLE (DEXILANT) 60 MG CAPSULE    Take 1 capsule (60 mg total) by mouth daily.   DULOXETINE (CYMBALTA) 60 MG CAPSULE    Take 1 capsule (60 mg total) by mouth daily.   EMPAGLIFLOZIN (JARDIANCE) 10 MG TABS TABLET    Take 1 tablet (10 mg total) by mouth daily before breakfast.   EZETIMIBE (ZETIA)  10 MG TABLET    Take 1 tablet (10 mg total) by mouth daily.   FLUTICASONE-UMECLIDIN-VILANT (TRELEGY ELLIPTA) 100-62.5-25 MCG/INH AEPB    Inhale 1 each into the lungs daily. In place of breo   FUROSEMIDE (LASIX) 20 MG TABLET    TAKE 1 TABLET BY MOUTH DAILY AS NEEDED( FOR WEIGHT GAIN OF 2 POUNDS OVERNIGHT OR 5 POUNDS IN 1 WEEK)   GABAPENTIN (NEURONTIN) 300 MG CAPSULE    Take 1 capsule (300 mg total) by mouth 3 (three) times daily.   GABAPENTIN (NEURONTIN) 800 MG TABLET    Take 800 mg by mouth at bedtime.   INSULIN LISPRO (HUMALOG) 100 UNIT/ML KIWKPEN    Inject 7-8 Units into the skin 3 (three) times daily. Reported on 12/02/2015/  sliding scale 1 unit for every 7 units of carbs; and 1 unit for every 20 above 120   INSULIN PEN NEEDLE 32G X 4 MM MISC    Inject 1 each into the skin as needed.   LANTUS SOLOSTAR 100 UNIT/ML SOLOSTAR PEN    Inject 31-33 Units into the skin daily at 10 pm.   LEVOCETIRIZINE (XYZAL) 5 MG TABLET    TAKE 1 TABLET(5 MG) BY MOUTH EVERY EVENING   LEVOTHYROXINE (SYNTHROID) 50 MCG TABLET    Take 1 tablet (50 mcg total) by mouth daily before breakfast. One daily and two on Sundays - recheck level in 6 weeks   LORATADINE (CLARITIN) 10 MG TABLET    Take 10 mg by mouth daily.    METFORMIN (GLUCOPHAGE) 1000 MG TABLET    Take 1,000 mg by mouth 2 (two) times daily with a meal.   METOPROLOL SUCCINATE (TOPROL-XL) 100 MG 24 HR TABLET    TAKE 1 TABLET(100 MG) BY MOUTH DAILY WITH OR IMMEDIATELY FOLLOWING A MEAL   MONTELUKAST (SINGULAIR) 10 MG TABLET    Take 10 mg by mouth at bedtime.   MONTELUKAST (SINGULAIR) 10 MG TABLET    Take 1 tablet by mouth at bedtime.   MULTIPLE VITAMINS-MINERALS (MENS MULTI VITAMIN & MINERAL PO)    Take 1 tablet by mouth in the morning and at bedtime.   NA SULFATE-K SULFATE-MG SULF (SUPREP BOWEL PREP KIT) 17.5-3.13-1.6 GM/177ML SOLN    Take 1 kit by mouth as directed.   NALOXONE (NARCAN) NASAL SPRAY 4 MG/0.1 ML    For excess sedation from opioids   NITROGLYCERIN (NITROSTAT) 0.3 MG SL TABLET    Place 1 tablet (0.3 mg total) under the tongue every 5 (five) minutes as needed for chest pain. Maximum 3 doses.   ROSUVASTATIN (CRESTOR) 5 MG TABLET    Take 5 mg by mouth daily.   SEMAGLUTIDE,0.25 OR 0.5MG /DOS, 2 MG/1.5ML SOPN    Inject 1 mg into the skin once a week.   TAMSULOSIN (FLOMAX) 0.4 MG CAPS CAPSULE    TAKE 2 CAPSULES BY MOUTH DAILY  Modified Medications   Modified Medication Previous Medication   OXYCODONE HCL 10 MG TABS Oxycodone HCl 10 MG TABS      Take 1 tablet (10 mg total) by mouth in the morning, at noon, in the evening, and at bedtime.    Take 1 tablet (10 mg total) by mouth in the  morning, at noon, in the evening, and at bedtime.  Discontinued Medications   No medications on file   ----------------------------------------------------------------------------------------------------------------------  Follow-up: Return in about 2 months (around 06/16/2021) for evaluation, med refill.   Procedure: L5-S1 LESI with fluoroscopic guidance and no moderate sedation  NOTE: The risks,  benefits, and expectations of the procedure have been discussed and explained to the patient who was understanding and in agreement with suggested treatment plan. No guarantees were made.  DESCRIPTION OF PROCEDURE: Lumbar epidural steroid injection with no IV Versed, EKG, blood pressure, pulse, and pulse oximetry monitoring. The procedure was performed with the patient in the prone position under fluoroscopic guidance.  Sterile prep x3 was initiated and I then injected subcutaneous lidocaine to the overlying L5-S1 site after its fluoroscopic identifictation.  Using strict aseptic technique, I then advanced an 18-gauge Tuohy epidural needle in the midline using interlaminar approach via loss-of-resistance to saline technique. There was negative aspiration for heme or  CSF.  I then confirmed position with both AP and Lateral fluoroscan.  2 cc of contrast dye were injected and a  total of 5 mL of Preservative-Free normal saline mixed with 40 mg of Kenalog and 1cc Ropicaine 0.2 percent were injected incrementally via the  epidurally placed needle. The needle was removed. The patient tolerated the injection well and was convalesced and discharged to home in stable condition. Should the patient have any post procedure difficulty they have been instructed on how to contact us for assistance.    Molli Barrows, MD

## 2021-04-15 NOTE — Patient Instructions (Signed)
Pain Management Discharge Instructions  General Discharge Instructions :  If you need to reach your doctor call: Monday-Friday 8:00 am - 4:00 pm at [phone] or toll free [phone].  After clinic hours [phone] to have operator reach doctor.  Bring all of your medication bottles to all your appointments in the pain clinic.  To cancel or reschedule your appointment with Pain Management please remember to call 24 hours in advance to avoid a fee.  Refer to the educational materials which you have been given on: General Risks, I had my Procedure. Discharge Instructions, Post Sedation.  Post Procedure Instructions:  The drugs you were given will stay in your system until tomorrow, so for the next 24 hours you should not drive, make any legal decisions or drink any alcoholic beverages.  You may eat anything you prefer, but it is better to start with liquids then soups and crackers, and gradually work up to solid foods.  Please notify your doctor immediately if you have any unusual bleeding, trouble breathing or pain that is not related to your normal pain.  Depending on the type of procedure that was done, some parts of your body may feel week and/or numb.  This usually clears up by tonight or the next day.  Walk with the use of an assistive device or accompanied by an adult for the 24 hours.  You may use ice on the affected area for the first 24 hours.  Put ice in a Ziploc bag and cover with a towel and place against area 15 minutes on 15 minutes off.  You may switch to heat after 24 hours.Epidural Steroid Injection Patient Information  Description: The epidural space surrounds the nerves as they exit the spinal cord.  In some patients, the nerves can be compressed and inflamed by a bulging disc or a tight spinal canal (spinal stenosis).  By injecting steroids into the epidural space, we can bring irritated nerves into direct contact with a potentially helpful medication.  These  steroids act directly on the irritated nerves and can reduce swelling and inflammation which often leads to decreased pain.  Epidural steroids may be injected anywhere along the spine and from the neck to the low back depending upon the location of your pain.   After numbing the skin with local anesthetic (like Novocaine), a small needle is passed into the epidural space slowly.  You may experience a sensation of pressure while this is being done.  The entire block usually last less than 10 minutes.  Conditions which may be treated by epidural steroids:  Low back and leg pain Neck and arm pain Spinal stenosis Post-laminectomy syndrome Herpes zoster (shingles) pain Pain from compression fractures  Preparation for the injection:  Do not eat any solid food or dairy products within 8 hours of your appointment.  You may drink clear liquids up to 3 hours before appointment.  Clear liquids include water, black coffee, juice or soda.  No milk or cream please. You may take your regular medication, including pain medications, with a sip of water before your appointment  Diabetics should hold regular insulin (if taken separately) and take 1/2 normal NPH dos the morning of the procedure.  Carry some sugar containing items with you to your appointment. A driver must accompany you and be prepared to drive you home after your procedure.  Bring all your current medications with your. An IV may be inserted and sedation may be given at the discretion of the physician.     A blood pressure cuff, EKG and other monitors will often be applied during the procedure.  Some patients may need to have extra oxygen administered for a short period. You will be asked to provide medical information, including your allergies, prior to the procedure.  We must know immediately if you are taking blood thinners (like Coumadin/Warfarin)  Or if you are allergic to IV iodine contrast (dye). We must know if you could possible be  pregnant.  Possible side-effects: Bleeding from needle site Infection (rare, may require surgery) Nerve injury (rare) Numbness & tingling (temporary) Difficulty urinating (rare, temporary) Spinal headache ( a headache worse with upright posture) Light -headedness (temporary) Pain at injection site (several days) Decreased blood pressure (temporary) Weakness in arm/leg (temporary) Pressure sensation in back/neck (temporary)  Call if you experience: Fever/chills associated with headache or increased back/neck pain. Headache worsened by an upright position. New onset weakness or numbness of an extremity below the injection site Hives or difficulty breathing (go to the emergency room) Inflammation or drainage at the infection site Severe back/neck pain Any new symptoms which are concerning to you  Please note:  Although the local anesthetic injected can often make your back or neck feel good for several hours after the injection, the pain will likely return.  It takes 3-7 days for steroids to work in the epidural space.  You may not notice any pain relief for at least that one week.  If effective, we will often do a series of three injections spaced 3-6 weeks apart to maximally decrease your pain.  After the initial series, we generally will wait several months before considering a repeat injection of the same type.  If you have any questions, please call [phone]  [facility] 

## 2021-04-15 NOTE — Progress Notes (Signed)
Nursing Pain Medication Assessment:  Safety precautions to be maintained throughout the outpatient stay will include: orient to surroundings, keep bed in low position, maintain call bell within reach at all times, provide assistance with transfer out of bed and ambulation.  Medication Inspection Compliance: Pill count conducted under aseptic conditions, in front of the patient. Neither the pills nor the bottle was removed from the patient's sight at any time. Once count was completed pills were immediately returned to the patient in their original bottle.  Medication: Oxycodone IR Pill/Patch Count:  90 of 120 pills remain Pill/Patch Appearance: Markings consistent with prescribed medication Bottle Appearance: Standard pharmacy container. Clearly labeled. Filled Date: 07 / 05 / 2022 Last Medication intake:  Today

## 2021-04-16 ENCOUNTER — Encounter: Payer: Self-pay | Admitting: Gastroenterology

## 2021-04-16 DIAGNOSIS — G894 Chronic pain syndrome: Secondary | ICD-10-CM | POA: Diagnosis not present

## 2021-04-16 LAB — SURGICAL PATHOLOGY

## 2021-04-18 ENCOUNTER — Other Ambulatory Visit: Payer: Self-pay | Admitting: Family

## 2021-04-18 DIAGNOSIS — I5022 Chronic systolic (congestive) heart failure: Secondary | ICD-10-CM

## 2021-04-18 NOTE — Telephone Encounter (Signed)
Rx request sent to pharmacy.  

## 2021-04-23 LAB — TOXASSURE SELECT 13 (MW), URINE

## 2021-04-30 ENCOUNTER — Other Ambulatory Visit: Payer: Self-pay

## 2021-04-30 ENCOUNTER — Telehealth: Payer: Self-pay | Admitting: Gastroenterology

## 2021-04-30 ENCOUNTER — Telehealth: Payer: Self-pay

## 2021-04-30 ENCOUNTER — Telehealth: Payer: Self-pay | Admitting: Cardiovascular Disease

## 2021-04-30 DIAGNOSIS — D500 Iron deficiency anemia secondary to blood loss (chronic): Secondary | ICD-10-CM

## 2021-04-30 MED ORDER — CLOPIDOGREL BISULFATE 75 MG PO TABS: ORAL_TABLET | ORAL | 2 refills | Status: DC

## 2021-04-30 NOTE — Telephone Encounter (Signed)
clopidogrel (PLAVIX) 75 MG tablet 30 tablet 2 04/30/2021    Sig: TAKE 1 TABLET(75 MG) BY MOUTH DAILY WITH BREAKFAST   Sent to pharmacy as: clopidogrel (PLAVIX) 75 MG tablet   E-Prescribing Status: Receipt confirmed by pharmacy (04/30/2021  1:23 PM EDT)     [place] [id number] - [place], [place]

## 2021-04-30 NOTE — Telephone Encounter (Signed)
Pt scheduled for capsule endoscopy at [facility] on 05/14/21.

## 2021-04-30 NOTE — Telephone Encounter (Signed)
Patient calling to set up a capsule study, per Dr. Allen Norris.

## 2021-04-30 NOTE — Telephone Encounter (Signed)
*  STAT* If patient is at the pharmacy, call can be transferred to refill team.   1. Which medications need to be refilled? (please list name of each medication and dose if known) Clopidogrel 75 mg take 1 tablet daily  2. Which pharmacy/location (including street and city if local pharmacy) is medication to be sent to? [facility]  3. Do they need a 30 day or 90 day supply? 90 day supply

## 2021-04-30 NOTE — Telephone Encounter (Signed)
Pt called and stated the pharmacy do not have the Cyclobenzaprine prescription '10mg'$  pt asks if you can send it to the pharmacy

## 2021-04-30 NOTE — Telephone Encounter (Signed)
Routed to Dr [name]

## 2021-05-01 ENCOUNTER — Telehealth: Payer: Self-pay

## 2021-05-01 NOTE — Telephone Encounter (Signed)
Pt states that he needs Rx for flexeril '10mg'$ 

## 2021-05-01 NOTE — Telephone Encounter (Signed)
Refill for Flexeril called in to patient's pharmacy of choice.

## 2021-05-05 MED ORDER — CYCLOBENZAPRINE HCL 10 MG PO TABS: 10.0000 mg | ORAL_TABLET | Freq: Two times a day (BID) | ORAL | 3 refills | Status: DC | PRN

## 2021-05-05 NOTE — Addendum Note (Signed)
Addended by: Molli Barrows on: 05/05/2021 10:53 AM   Modules accepted: Orders

## 2021-05-08 ENCOUNTER — Ambulatory Visit: Payer: Medicare Other | Admitting: Podiatry

## 2021-05-14 ENCOUNTER — Telehealth: Payer: Self-pay | Admitting: Gastroenterology

## 2021-05-14 ENCOUNTER — Telehealth: Payer: Self-pay

## 2021-05-14 ENCOUNTER — Encounter: Payer: Self-pay | Admitting: Gastroenterology

## 2021-05-14 NOTE — Telephone Encounter (Signed)
Patient has to fly to [place] to be with his Dad and wants to cancel procedure. Patient will call Ginger when he's ready to reschedule.Clinical staff will follow up with patient.

## 2021-05-14 NOTE — Telephone Encounter (Signed)
Sent message to pt regarding cancelling his capsule study that is scheduled for tomorrow. Pt will call back when he is ready to schedule.

## 2021-05-14 NOTE — Progress Notes (Signed)
Chronic Care Management Pharmacy Assistant   Name: [name]  MRN: [id number] DOB: 1961/01/14   Reason for Encounter:Diabetes Disease State Call.   Recent office visits:  04/02/2021 Dr.Sowles MD (PCP) No Medication Changes Noted  Recent consult visits:  04/15/2021 Dr.Adams MD (Pain Medicine) start Oxycodone HCl 10 MG TABS 03/06/2021 Dr.Wohl MD (Gastroenterology)Hold Plavix 5 days before and resume 1 day after 03/06/2021 Dr.Adam MD (Pain Medicine) No medication Changes noted  Hospital visits:  Medication Reconciliation was completed by comparing discharge summary, patient's EMR and Pharmacy list, and upon discussion with patient.  Admitted to the hospital on 04/14/2021 due to Colonoscopy. Discharge date was 04/14/2021. Discharged from [facility].    New?Medications Started at [facility] Discharge:?? -started None  Medication Changes at Hospital Discharge: -Changed None  Medications Discontinued at Hospital Discharge: -Stopped None  Medications that remain the same after Hospital Discharge:??  -All other medications will remain the same.    Medications: Outpatient Encounter Medications as of 05/14/2021  Medication Sig Note   albuterol (PROVENTIL HFA;VENTOLIN HFA) 108 (90 Base) MCG/ACT inhaler Inhale 2 puffs into the lungs every 6 (six) hours as needed for wheezing or shortness of breath.     albuterol (PROVENTIL) (2.5 MG/3ML) 0.083% nebulizer solution Take 2.5 mg by nebulization every 6 (six) hours as needed for wheezing or shortness of breath.     aspirin EC 81 MG tablet Take 1 tablet (81 mg total) by mouth daily.    clopidogrel (PLAVIX) 75 MG tablet TAKE 1 TABLET(75 MG) BY MOUTH DAILY WITH BREAKFAST    cyclobenzaprine (FLEXERIL) 10 MG tablet Take 1 tablet (10 mg total) by mouth 2 (two) times daily as needed.    dexlansoprazole (DEXILANT) 60 MG capsule Take 1 capsule (60 mg total) by mouth daily.    DULoxetine (CYMBALTA) 60 MG capsule Take 1 capsule (60  mg total) by mouth daily.    ezetimibe (ZETIA) 10 MG tablet Take 1 tablet (10 mg total) by mouth daily.    Fluticasone-Umeclidin-Vilant (TRELEGY ELLIPTA) 100-62.5-25 MCG/INH AEPB Inhale 1 each into the lungs daily. In place of breo 04/14/2021: Not used in 6 months    furosemide (LASIX) 20 MG tablet TAKE 1 TABLET BY MOUTH DAILY AS NEEDED FOR WEIGHT GAIN OF 2 POUNDS OVERNIGHT OR 5 POUNDS PER 1 WEEK    gabapentin (NEURONTIN) 300 MG capsule Take 1 capsule (300 mg total) by mouth 3 (three) times daily.    gabapentin (NEURONTIN) 800 MG tablet Take 800 mg by mouth at bedtime.    insulin lispro (HUMALOG) 100 UNIT/ML KiwkPen Inject 7-8 Units into the skin 3 (three) times daily. Reported on 12/02/2015/ sliding scale 1 unit for every 7 units of carbs; and 1 unit for every 20 above 120    Insulin Pen Needle 32G X 4 MM MISC Inject 1 each into the skin as needed.    JARDIANCE 10 MG TABS tablet TAKE 1 TABLET(10 MG) BY MOUTH DAILY BEFORE BREAKFAST    LANTUS SOLOSTAR 100 UNIT/ML Solostar Pen Inject 31-33 Units into the skin daily at 10 pm.    levocetirizine (XYZAL) 5 MG tablet TAKE 1 TABLET(5 MG) BY MOUTH EVERY EVENING    levothyroxine (SYNTHROID) 50 MCG tablet Take 1 tablet (50 mcg total) by mouth daily before breakfast. One daily and two on Sundays - recheck level in 6 weeks    loratadine (CLARITIN) 10 MG tablet Take 10 mg by mouth daily.     metFORMIN (GLUCOPHAGE) 1000 MG tablet Take 1,000 mg  by mouth 2 (two) times daily with a meal.    metoprolol succinate (TOPROL-XL) 100 MG 24 hr tablet TAKE 1 TABLET(100 MG) BY MOUTH DAILY WITH OR IMMEDIATELY FOLLOWING A MEAL    montelukast (SINGULAIR) 10 MG tablet Take 10 mg by mouth at bedtime.    montelukast (SINGULAIR) 10 MG tablet Take 1 tablet by mouth at bedtime.    Multiple Vitamins-Minerals (MENS MULTI VITAMIN & MINERAL PO) Take 1 tablet by mouth in the morning and at bedtime.    Na Sulfate-K Sulfate-Mg Sulf (SUPREP BOWEL PREP KIT) 17.5-3.13-1.6 GM/177ML SOLN Take 1  kit by mouth as directed.    naloxone (NARCAN) nasal spray 4 mg/0.1 mL For excess sedation from opioids    nitroGLYCERIN (NITROSTAT) 0.3 MG SL tablet Place 1 tablet (0.3 mg total) under the tongue every 5 (five) minutes as needed for chest pain. Maximum 3 doses.    Oxycodone HCl 10 MG TABS Take 1 tablet (10 mg total) by mouth in the morning, at noon, in the evening, and at bedtime.    [START ON 06/07/2021] Oxycodone HCl 10 MG TABS Take 1 tablet (10 mg total) by mouth in the morning, at noon, in the evening, and at bedtime.    rosuvastatin (CRESTOR) 5 MG tablet Take 5 mg by mouth daily.    Semaglutide,0.25 or 0.5MG /DOS, 2 MG/1.5ML SOPN Inject 1 mg into the skin once a week.    tamsulosin (FLOMAX) 0.4 MG CAPS capsule TAKE 2 CAPSULES BY MOUTH DAILY    No facility-administered encounter medications on file as of 05/14/2021.    Care Gaps: Shingrix Vaccine Ophthalmology Exam COVID-19 Vaccine Influenza Vaccine Star Rating Drugs: Jardiance 10 mg 30 day supply last filled 04/18/2021 at [place]. Rosuvastatin 5 mg 90 day supply last filled 04/18/2021 at [place]. Metformin 1000 mg 90 day supply last filled 04/18/2021 at [place]. Ozempic 1 mg 28 day supply last filled 07/1582022 at [place]. Medication Fill Gaps: None ID  Recent Relevant Labs: Lab Results  Component Value Date/Time   HGBA1C 7.9 01/29/2021 12:00 AM   HGBA1C 8.4 (A) 09/16/2020 11:47 AM   HGBA1C 7.5 (H) 10/16/2019 02:24 PM   HGBA1C 7.9 (H) 05/05/2019 10:12 AM   HGBA1C 7.8 10/06/2018 12:00 AM   MICROALBUR 1.4 05/05/2019 10:12 AM   MICROALBUR 204 05/18/2018 12:00 AM   MICROALBUR 76.1 05/18/2018 12:00 AM   MICROALBUR Neg 01/25/2018 11:59 AM    Kidney Function Lab Results  Component Value Date/Time   CREATININE 1.22 12/25/2020 11:35 AM   CREATININE 1.21 12/10/2020 10:28 AM   CREATININE 1.64 (H) 10/16/2019 02:24 PM   CREATININE 1.54 (H) 05/05/2019 10:12 AM   GFRNONAA >60 12/25/2020  11:35 AM   GFRNONAA 45 (L) 10/16/2019 02:24 PM   GFRAA 61 09/17/2020 11:17 AM   GFRAA 52 (L) 10/16/2019 02:24 PM    Current antihyperglycemic regimen:  Humalog 1 unit per 8g of carb, 1 unit for every 20 glucose above 120 three times daily Lantus 20-23 units daily Jardiance 10 mg daily Metformin 1000 mg twice daily Ozempic 1 mg weekly What recent interventions/DTPs have been made to improve glycemic control:  None ID Have there been any recent hospitalizations or ED visits since last visit with CPP? Yes Patient denies hypoglycemic symptoms, including Pale, Sweaty, Shaky, Hungry, Nervous/irritable, and Vision changes Patient denies hyperglycemic symptoms, including blurry vision, excessive thirst, fatigue, polyuria, and weakness How often are you checking your blood sugar? 3-4 times daily What are your blood sugars ranging?  Patient  states he checks his blood sugar before and after meals ranging around 120-140.Patient reports the highest his blood sugar was 220 and the lowest was 44. During the week, how often does your blood glucose drop below 70? Twice a week Patient reports his blood sugar drop below 70 twice, once it was 44 and the other reading he does not remember.Patient states he drunk some orange juice which help a lot.Patient denies any hypoglycemia symptoms. Are you checking your feet daily/regularly?   Patient states he can not feel anything in his feet due to neuropathy.  Adherence Review: Is the patient currently on a STATIN medication? Yes Is the patient currently on ACE/ARB medication? No Does the patient have >5 day gap between last estimated fill dates? No  Anderson Malta Clinical Production designer, theatre/television/film [phone]

## 2021-05-15 ENCOUNTER — Encounter: Admission: RE | Payer: Self-pay | Source: Ambulatory Visit

## 2021-05-15 ENCOUNTER — Ambulatory Visit: Admission: RE | Admit: 2021-05-15 | Payer: Medicare Other | Source: Ambulatory Visit | Admitting: Gastroenterology

## 2021-05-15 SURGERY — IMAGING PROCEDURE, GI TRACT, INTRALUMINAL, VIA CAPSULE

## 2021-06-02 ENCOUNTER — Other Ambulatory Visit: Payer: Self-pay | Admitting: Cardiovascular Disease

## 2021-06-04 DIAGNOSIS — E785 Hyperlipidemia, unspecified: Secondary | ICD-10-CM | POA: Diagnosis not present

## 2021-06-04 DIAGNOSIS — E1165 Type 2 diabetes mellitus with hyperglycemia: Secondary | ICD-10-CM | POA: Diagnosis not present

## 2021-06-04 DIAGNOSIS — I152 Hypertension secondary to endocrine disorders: Secondary | ICD-10-CM | POA: Diagnosis not present

## 2021-06-04 DIAGNOSIS — E559 Vitamin D deficiency, unspecified: Secondary | ICD-10-CM | POA: Diagnosis not present

## 2021-06-04 DIAGNOSIS — E1169 Type 2 diabetes mellitus with other specified complication: Secondary | ICD-10-CM | POA: Diagnosis not present

## 2021-06-04 DIAGNOSIS — E1159 Type 2 diabetes mellitus with other circulatory complications: Secondary | ICD-10-CM | POA: Diagnosis not present

## 2021-06-04 DIAGNOSIS — Z794 Long term (current) use of insulin: Secondary | ICD-10-CM | POA: Diagnosis not present

## 2021-06-04 LAB — HEMOGLOBIN A1C: Hemoglobin A1C: 8.3

## 2021-06-05 ENCOUNTER — Ambulatory Visit: Payer: Medicare Other | Admitting: Nurse Practitioner

## 2021-06-06 ENCOUNTER — Ambulatory Visit: Payer: Medicare Other | Admitting: Nurse Practitioner

## 2021-06-06 ENCOUNTER — Encounter: Payer: Self-pay | Admitting: Nurse Practitioner

## 2021-06-06 NOTE — Progress Notes (Deleted)
Office Visit    Patient Name: [name] Date of Encounter: 06/06/2021  Primary Care Provider:  [name], MD Primary Cardiologist:  [name], MD  Chief Complaint    60 year old male with a history of CAD status post multiple PCI's, hypertension, hyperlipidemia, type 2 diabetes mellitus, diabetic neuropathy, myasthenia gravis, sepsis, and obstructive sleep apnea, who presents for follow-up of CAD.  Past Medical History    Past Medical History:  Diagnosis Date   Allergy    dust, seasonal (worse in the fall).   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 Followed by Pain Specialist in [place], back and neck   Asthma    BRONCHITIS   Cataract    First Dx in 2012   Chronic combined systolic and diastolic congestive heart failure ([place])    a. 03/2018 Echo: EF 30-35%, ant, antlat, apical AK, Gr1 DD; b. 07/2018 Echo: EF 35-40%, anteroseptal, apical, and ant HK. Gr1 DD; c. 09/2019 TEE: EF 40-45%; d. 12/2020 Echo: EF 35-40%, sev apical ant, apical lat, apical inf, apical HK. GrI DD, nl RV fxn.   Coronary artery disease    a. Prior Ant MI->s/p multiple stents placed in the LAD and right coronary artery ([place]); b. 2016 Cath: reportedly nonobs dzs;  c. 04/2018 Cath/PCI: LM nl, LAD 20p, patent mid stent, LCX [id number] [place] DES), OM1 nl, OM2 50, OM3 40 w/ patent stent, RCA 40p, 261m40d w/ patent stent in RPDA, RPAV 60, EF 25-35%. 2+MR; d. 02/2019 MV: Apical scar, no isch, EF 30-44%.   Deaf, left    Diabetes mellitus without complication ([place]   TYPE 2   Diabetic peripheral neuropathy ([place]   feet and hands   FUO (fever of unknown origin) 08/03/2018   GERD (gastroesophageal reflux disease)    Headache    muscle tension   Hyperlipidemia    Hypertension    CONTROLLED ON MEDS   Hyperthyroidism    Insomnia    Ischemic cardiomyopathy    a. 03/2018 Echo: EF 30-35%, ant, antlat, apical AK, Gr1 DD; b. 07/2018 Echo: EF 35-40%, anteroseptal, apical, and ant HK. Gr1 DD; c. 09/2019  TEE: EF 40-45%; d. 12/2020 Echo: EF 35-40%, sev apical ant, apical lat, apical inf, apical HK. GrI DD, nl RV fxn.   Knee pain, acute 05/06/2020   Left arm weakness 10/04/2019   Left leg weakness 12/01/2019   Lyme disease    Chronic   Myasthenia gravis ([place]   (03/18/21 - no current treatment - "better than it has ever been" per pt)   Myocardial infarction ([place]   Palpitations    a. 10/2020 Zio: RSR, 88 avg. 3 brief SVT episodes (max 5 beats @ 128). Rare PACs/PVCs. Triggered events did not correlate w/ significant arrthymia - some w/ sinus tach.   Seasonal allergies    Sepsis ([place]   a.07/2018 - unknown source. TEE neg for veg 09/2019.   Sleep apnea    CPAP   Wears hearing aid in both ears    Past Surgical History:  Procedure Laterality Date   BILATERAL CARPAL TUNNEL RELEASE Bilateral L in 2012 and R in 2013   [facility] [place]  [place].   COLONOSCOPY WITH PROPOFOL N/A 01/10/2016   Procedure: COLONOSCOPY WITH PROPOFOL;  Surgeon: DaLucilla LameMD;  Location: [facility];  Service: Endoscopy;  Laterality: N/A;   COLONOSCOPY WITH PROPOFOL N/A 04/14/2021  Procedure: COLONOSCOPY WITH PROPOFOL;  Surgeon: [name], MD;  Location: [place];  Service: Endoscopy;  Laterality: N/A;  Diabetic - insulin and oral meds   CORONARY ANGIOPLASTY     CORONARY STENT INTERVENTION N/A 04/25/2018   Procedure: CORONARY STENT INTERVENTION;  Surgeon: [name], MD;  Location: [place] [facility];  Service: Cardiovascular;  Laterality: N/A;   ESOPHAGOGASTRODUODENOSCOPY (EGD) WITH PROPOFOL N/A 01/10/2016   Procedure: ESOPHAGOGASTRODUODENOSCOPY (EGD) WITH PROPOFOL;  Surgeon: [name], MD;  Location: [facility];  Service: Endoscopy;  Laterality: N/A;   ESOPHAGOGASTRODUODENOSCOPY (EGD) WITH PROPOFOL N/A 04/14/2021   Procedure: ESOPHAGOGASTRODUODENOSCOPY (EGD) WITH BIOPSY;  Surgeon: [name], MD;  Location: [place];  Service:  Endoscopy;  Laterality: N/A;   EYE SURGERY Bilateral 2012   cataract/bilateral vitrectomies   LEFT HEART CATH AND CORONARY ANGIOGRAPHY Left 04/25/2018   Procedure: LEFT HEART CATH AND CORONARY ANGIOGRAPHY;  Surgeon: [name], MD;  Location: [place] [facility];  Service: Cardiovascular;  Laterality: Left;   TEE WITHOUT CARDIOVERSION N/A 09/05/2018   Procedure: TRANSESOPHAGEAL ECHOCARDIOGRAM (TEE);  Surgeon: [name], MD;  Location: [facility];  Service: Cardiovascular;  Laterality: N/A;   TONSILLECTOMY AND ADENOIDECTOMY     As a child   TUNNELED VENOUS CATHETER PLACEMENT     removed    Allergies  Allergies  Allergen Reactions   Azathioprine Other (See Comments)    Azathioprine hypersensitivity reaction - Symptoms mimicking sepsis - was hospitalized   Novolog [Insulin Aspart] Hives    History of Present Illness    60 year old male with the above complex past medical history including CAD status post prior MI in 2009 with subsequent placement of a total of 10 stents between the LAD and right coronary artery in [place].  Other history includes hypertension, hyperlipidemia, type 2 diabetes mellitus, sinus tachycardia, diabetic neuropathy, myasthenia gravis, GERD, sleep apnea, sepsis, and ischemic cardiomyopathy.  In June 2019, he had worsening dyspnea on exertion and chest pain.  Echo showed an EF of 30 to 35%.  Cath showed a new 90% stenosis in the mid left circumflex with multiple patent LAD, RCA, and OM 2 stents.  Circumflex was successfully treated with a drug-eluting stent.  He was admitted twice in the fall 2019 in the setting of sepsis.  TEE in December 2019 showed an EF of 40 to 45% without vegetation.  GDMT somewhat limited by hypotension and dizziness resulting in discontinuation of Entresto previously.  In October 2021, he had a syncopal episode in the setting of coughing.  He was subsequently found to be orthostatic he was encouraged to increase p.o. fluids.  Zio  monitoring in January 2022 did not show any significant arrhythmias.  Triggered events were symptoms associate with sinus tachycardia.  He was last seen in cardiology clinic in May of this year, at which time he reported fatigue lab work was unremarkable.  Home Medications    Current Outpatient Medications  Medication Sig Dispense Refill   albuterol (PROVENTIL HFA;VENTOLIN HFA) 108 (90 Base) MCG/ACT inhaler Inhale 2 puffs into the lungs every 6 (six) hours as needed for wheezing or shortness of breath.      albuterol (PROVENTIL) (2.5 MG/3ML) 0.083% nebulizer solution Take 2.5 mg by nebulization every 6 (six) hours as needed for wheezing or shortness of breath.      aspirin EC 81 MG tablet Take 1 tablet (81 mg total) by mouth daily. 90 tablet 3   clopidogrel (PLAVIX) 75 MG tablet TAKE 1 TABLET(75 MG)  BY MOUTH DAILY WITH BREAKFAST 30 tablet 2   dexlansoprazole (DEXILANT) 60 MG capsule Take 1 capsule (60 mg total) by mouth daily. 30 capsule 11   DULoxetine (CYMBALTA) 60 MG capsule Take 1 capsule (60 mg total) by mouth daily. 90 capsule 1   ezetimibe (ZETIA) 10 MG tablet TAKE 1 TABLET(10 MG) BY MOUTH DAILY 90 tablet 3   Fluticasone-Umeclidin-Vilant (TRELEGY ELLIPTA) 100-62.5-25 MCG/INH AEPB Inhale 1 each into the lungs daily. In place of breo 60 each 2   furosemide (LASIX) 20 MG tablet TAKE 1 TABLET BY MOUTH DAILY AS NEEDED FOR WEIGHT GAIN OF 2 POUNDS OVERNIGHT OR 5 POUNDS PER 1 WEEK 90 tablet 0   gabapentin (NEURONTIN) 300 MG capsule Take 1 capsule (300 mg total) by mouth 3 (three) times daily. 90 capsule 1   gabapentin (NEURONTIN) 800 MG tablet Take 800 mg by mouth at bedtime.     insulin lispro (HUMALOG) 100 UNIT/ML KiwkPen Inject 7-8 Units into the skin 3 (three) times daily. Reported on 12/02/2015/ sliding scale 1 unit for every 7 units of carbs; and 1 unit for every 20 above 120     Insulin Pen Needle 32G X 4 MM MISC Inject 1 each into the skin as needed.     JARDIANCE 10 MG TABS tablet TAKE 1  TABLET(10 MG) BY MOUTH DAILY BEFORE BREAKFAST 30 tablet 2   LANTUS SOLOSTAR 100 UNIT/ML Solostar Pen Inject 31-33 Units into the skin daily at 10 pm.  0   levocetirizine (XYZAL) 5 MG tablet TAKE 1 TABLET(5 MG) BY MOUTH EVERY EVENING 30 tablet 3   levothyroxine (SYNTHROID) 50 MCG tablet Take 1 tablet (50 mcg total) by mouth daily before breakfast. One daily and two on Sundays - recheck level in 6 weeks 32 tablet 1   loratadine (CLARITIN) 10 MG tablet Take 10 mg by mouth daily.      metFORMIN (GLUCOPHAGE) 1000 MG tablet Take 1,000 mg by mouth 2 (two) times daily with a meal.     metoprolol succinate (TOPROL-XL) 100 MG 24 hr tablet TAKE 1 TABLET(100 MG) BY MOUTH DAILY WITH OR IMMEDIATELY FOLLOWING A MEAL 90 tablet 2   montelukast (SINGULAIR) 10 MG tablet Take 10 mg by mouth at bedtime.     montelukast (SINGULAIR) 10 MG tablet Take 1 tablet by mouth at bedtime.     Multiple Vitamins-Minerals (MENS MULTI VITAMIN & MINERAL PO) Take 1 tablet by mouth in the morning and at bedtime.     Na Sulfate-K Sulfate-Mg Sulf (SUPREP BOWEL PREP KIT) 17.5-3.13-1.6 GM/177ML SOLN Take 1 kit by mouth as directed. 354 mL 0   naloxone (NARCAN) nasal spray 4 mg/0.1 mL For excess sedation from opioids 1 kit 2   nitroGLYCERIN (NITROSTAT) 0.3 MG SL tablet Place 1 tablet (0.3 mg total) under the tongue every 5 (five) minutes as needed for chest pain. Maximum 3 doses. 25 tablet 3   Oxycodone HCl 10 MG TABS Take 1 tablet (10 mg total) by mouth in the morning, at noon, in the evening, and at bedtime. 120 tablet 0   [START ON 06/07/2021] Oxycodone HCl 10 MG TABS Take 1 tablet (10 mg total) by mouth in the morning, at noon, in the evening, and at bedtime. 120 tablet 0   rosuvastatin (CRESTOR) 5 MG tablet Take 5 mg by mouth daily.  0   Semaglutide,0.25 or 0.5MG/DOS, 2 MG/1.5ML SOPN Inject 1 mg into the skin once a week.     tamsulosin (FLOMAX) 0.4 MG CAPS capsule TAKE  2 CAPSULES BY MOUTH DAILY 60 capsule 2   No current  facility-administered medications for this visit.     Review of Systems    ***.  All other systems reviewed and are otherwise negative except as noted above.  Physical Exam    VS:  There were no vitals taken for this visit. , BMI There is no height or weight on file to calculate BMI.     GEN: Well nourished, well developed, in no acute distress. HEENT: normal. Neck: Supple, no JVD, carotid bruits, or masses. Cardiac: RRR, no murmurs, rubs, or gallops. No clubbing, cyanosis, edema.  Radials/DP/PT 2+ and equal bilaterally.  Respiratory:  Respirations regular and unlabored, clear to auscultation bilaterally. GI: Soft, nontender, nondistended, BS + x 4. MS: no deformity or atrophy. Skin: warm and dry, no rash. Neuro:  Strength and sensation are intact. Psych: Normal affect.  Accessory Clinical Findings    ECG personally reviewed by me today - *** - no acute changes.  Lab Results  Component Value Date   WBC 8.1 04/02/2021   HGB 11.7 (L) 04/02/2021   HCT 37.7 (L) 04/02/2021   MCV 84.5 04/02/2021   PLT 247 04/02/2021   Lab Results  Component Value Date   CREATININE 1.22 12/25/2020   BUN 25 (H) 12/25/2020   NA 138 12/25/2020   K 4.9 12/25/2020   CL 104 12/25/2020   CO2 26 12/25/2020   Lab Results  Component Value Date   ALT 20 09/17/2020   AST 17 09/17/2020   ALKPHOS 90 09/17/2020   BILITOT 0.3 09/17/2020   Lab Results  Component Value Date   CHOL 104 09/17/2020   HDL 41 09/17/2020   LDLCALC 44 09/17/2020   TRIG 97 09/17/2020   CHOLHDL 2.5 09/17/2020    Lab Results  Component Value Date   HGBA1C 7.9 01/29/2021    Assessment & Plan    1.  ***   Murray Hodgkins, NP 06/06/2021, 8:05 AM

## 2021-06-10 ENCOUNTER — Telehealth: Payer: Self-pay

## 2021-06-10 NOTE — Progress Notes (Signed)
    Chronic Care Management Pharmacy Assistant   Name: [name]  MRN: [id number] DOB: [birth year]  Patient called to be reminded of his appointment with [name], CPP on 06/11/2021 @ 1400 via telephone.   No answer, left message of appointment date, time and type of appointment (either telephone or in person). Left message to have all medications, supplements, blood pressure and/or blood sugar logs available during appointment and to return call if need to reschedule.  Star Rating Drug: Jardiance 10 mg last filled on 05/16/2021 for a 30-Day supply with [facility] Semaglutide 1 mg last filled on 05/20/2021 for a 28-Day supply with [facility] Rosuvastatin 5 mg last filled on 04/18/2021 for a 90-Day supply with [facility] Metformin 1000 mg last filled on 04/18/2021 for a 90-Day supply with [facility]  Any gaps in medications fill history? No  Care Gaps: Zoster Vaccines Ophthalmology Exam (last completed 08/02/2019) COVID-19 Vaccine Booster 4 Influenza Vaccine (last completed 07/23/2020)   Lynann Bologna, CPA/CMA Clinical Pharmacist Assistant Phone: [phone]

## 2021-06-11 ENCOUNTER — Telehealth: Payer: Medicare Other

## 2021-06-11 NOTE — Progress Notes (Deleted)
Chronic Care Management Pharmacy Note  06/11/2021 Name:  [name] MRN:  [id number] DOB:  [birth year]  Summary: Patient reports he is doing well, although he has been suffering from significant chest congestion. He has had multiple medication changes since last visit, which he reports tolerating well. He has lost 10 pounds since last march and reports his appetite continues to be quite variable. He is also under significant stress due to his family situation, but is working with family counseling to resolve it.   We are working on patient assistance for multiple medications which the patient faxed in prior to receiving the doctors' prescriptions.   Recommendations/Changes made from today's visit: Continue Current Medications  Plan: Will send prescription portion of patient assistance applications to provider's offices for signature and submission.   Subjective: [name] is an 60 y.o. year old male who is a primary patient of [name], MD.  The CCM team was consulted for assistance with disease management and care coordination needs.    Engaged with patient by telephone for follow up visit in response to provider referral for pharmacy case management and/or care coordination services.   Consent to Services:  The patient was given information about Chronic Care Management services, agreed to services, and gave verbal consent prior to initiation of services.  Please see initial visit note for detailed documentation.   Patient Care Team: [name], MD as PCP - General (Family Medicine) [name], MD as PCP - Cardiology (Cardiology) Erby Pian, MD as Consulting Physician (Pulmonary Disease) Molli Barrows, MD as Consulting Physician (Anesthesiology) Lonia Farber, MD as Consulting Physician (Endocrinology) Cleotilde Neer, MD as Referring Physician (Neurology) [name], MD as Consulting Physician (Gastroenterology) Germaine Pomfret, [facility] (Pharmacist)  Recent office visits: 04/02/21: Patient presented to Dr. Ancil Boozer for follow-up.  09/16/20: Patient presented to Dr. Ancil Boozer for follow-up. A1c 8.4%. Pyridostigmine stopped.  Recent consult visits: 06/04/21: Patient presented to Dr. Honor Junes (Endocrinology) for follow-up. A1c increased to 8.3%.   03/04/21: Patient presented to Laurann Montana, NP (Cardiology) for follow-up.  01/29/21: Patient presented to Dr. Honor Junes (Endocrinology) for follow-up. Ozempic incresed to 1 mg weekly, Lantus decreased to 20.  12/09/20: Patient presented to Dr. Gilford Rile (Cardiology) for follow-up. Patient started on Jardiance 10 mg daily.  11/15/20: Patient presented to Dr. Raul Del (Pulmonology) for cough.  09/24/20: Patient presented to Dr. Honor Junes (Endocrinology) for follow-up. Humalog increased. Levothyroxine increased to 50 mcg daily.   Hospital visits: None in previous 6 months   Objective:  Lab Results  Component Value Date   CREATININE 1.22 12/25/2020   BUN 25 (H) 12/25/2020   GFRNONAA >60 12/25/2020   GFRAA 61 09/17/2020   NA 138 12/25/2020   K 4.9 12/25/2020   CALCIUM 9.2 12/25/2020   CO2 26 12/25/2020   GLUCOSE 243 (H) 12/25/2020    Lab Results  Component Value Date/Time   HGBA1C 7.9 01/29/2021 12:00 AM   HGBA1C 8.4 (A) 09/16/2020 11:47 AM   HGBA1C 7.5 (H) 10/16/2019 02:24 PM   HGBA1C 7.9 (H) 05/05/2019 10:12 AM   HGBA1C 7.8 10/06/2018 12:00 AM   MICROALBUR 1.4 05/05/2019 10:12 AM   MICROALBUR 204 05/18/2018 12:00 AM   MICROALBUR 76.1 05/18/2018 12:00 AM   MICROALBUR Neg 01/25/2018 11:59 AM    Last diabetic Eye exam:  Lab Results  Component Value Date/Time   HMDIABEYEEXA Retinopathy (A) 08/02/2019 12:00 AM    Last diabetic Foot exam: No results found for: HMDIABFOOTEX  Lab Results  Component Value Date   CHOL 104 09/17/2020   HDL 41 09/17/2020   LDLCALC 44 09/17/2020   TRIG 97 09/17/2020   CHOLHDL 2.5 09/17/2020    Hepatic Function Latest Ref  Rng & Units 09/17/2020 10/16/2019 05/05/2019  Total Protein 6.0 - 8.5 g/dL 6.1 6.1 5.9(L)  Albumin 3.8 - 4.8 g/dL 4.2 - -  AST 0 - 40 IU/L $Remov'17 17 16  'RfMlcv$ ALT 0 - 44 IU/L $Remov'20 18 14  'IHDTnP$ Alk Phosphatase 44 - 121 IU/L 90 - -  Total Bilirubin 0.0 - 1.2 mg/dL 0.3 0.4 0.4  Bilirubin, Direct 0.00 - 0.40 mg/dL - - -    Lab Results  Component Value Date/Time   TSH 1.41 04/02/2021 02:49 PM   TSH 4.840 (H) 09/17/2020 11:17 AM    CBC Latest Ref Rng & Units 04/02/2021 09/17/2020 07/18/2020  WBC 3.8 - 10.8 Thousand/uL 8.1 8.8 5.5  Hemoglobin 13.2 - 17.1 g/dL 11.7(L) 10.9(L) 9.7(L)  Hematocrit 38.5 - 50.0 % 37.7(L) 34.8(L) 31.8(L)  Platelets 140 - 400 Thousand/uL 247 233 239    Lab Results  Component Value Date/Time   VD25OH 72 10/16/2019 02:24 PM    Clinical ASCVD: Yes  The ASCVD Risk score Mikey Bussing DC Jr., et al., 2013) failed to calculate for the following reasons:   The patient has a prior MI or stroke diagnosis    Depression screen [facility] 2/9 04/15/2021 04/02/2021 09/16/2020  Decreased Interest 0 0 1  Down, Depressed, Hopeless 0 1 1  PHQ - 2 Score 0 1 2  Altered sleeping - 2 1  Tired, decreased energy - 0 1  Change in appetite - 1 2  Feeling bad or failure about yourself  - 0 0  Trouble concentrating - 0 1  Moving slowly or fidgety/restless - 0 1  Suicidal thoughts - 0 0  PHQ-9 Score - 4 8  Difficult doing work/chores - - -  Some recent data might be hidden    Social History   Tobacco Use  Smoking Status Never  Smokeless Tobacco Never  Tobacco Comments   smoking cessation materials not required   BP Readings from Last 3 Encounters:  04/15/21 112/79  04/14/21 121/67  04/02/21 108/72   Pulse Readings from Last 3 Encounters:  04/15/21 87  04/14/21 85  04/02/21 79   Wt Readings from Last 3 Encounters:  04/15/21 228 lb (103.4 kg)  04/14/21 224 lb (101.6 kg)  04/02/21 233 lb (105.7 kg)   BMI Readings from Last 3 Encounters:  04/15/21 35.71 kg/m  04/14/21 35.08 kg/m  04/02/21  36.49 kg/m    Assessment/Interventions: Review of patient past medical history, allergies, medications, health status, including review of consultants reports, laboratory and other test data, was performed as part of comprehensive evaluation and provision of chronic care management services.   SDOH:  (Social Determinants of Health) assessments and interventions performed: Yes   SDOH Screenings   Alcohol Screen: Not on file  Depression (PHQ2-9): Low Risk    PHQ-2 Score: 0  Financial Resource Strain: High Risk   Difficulty of Paying Living Expenses: Hard  Food Insecurity: No Food Insecurity   Worried About Charity fundraiser in the Last Year: Never true   Ran Out of Food in the Last Year: Never true  Housing: Low Risk    Last Housing Risk Score: 0  Physical Activity: Insufficiently Active   Days of Exercise per Week: 3 days   Minutes of Exercise per Session: 30 min  Social Connections: Engineer, building services of Communication with Friends and Family: More than three times a week   Frequency of Social Gatherings with Friends and Family: Three times a week   Attends Religious Services: More than 4 times per year   Active Member of Clubs or Organizations: Yes   Attends Archivist Meetings: More than 4 times per year   Marital Status: Married  Stress: No Stress Concern Present   Feeling of Stress : Only a little  Tobacco Use: Low Risk    Smoking Tobacco Use: Never   Smokeless Tobacco Use: Never  Transportation Needs: No Transportation Needs   Lack of Transportation (Medical): No   Lack of Transportation (Non-Medical): No    CCM Care Plan  Allergies  Allergen Reactions   Azathioprine Other (See Comments)    Azathioprine hypersensitivity reaction - Symptoms mimicking sepsis - was hospitalized   Novolog [Insulin Aspart] Hives    Medications Reviewed Today     Reviewed by Mercer Pod, RN (Registered Nurse) on 05/14/21 at 1305  Med List Status: <None>    Medication Order Taking? Sig Documenting Provider Last Dose Status Informant  albuterol (PROVENTIL HFA;VENTOLIN HFA) 108 (90 Base) MCG/ACT inhaler [id number] No Inhale 2 puffs into the lungs every 6 (six) hours as needed for wheezing or shortness of breath.  [provider] Taking Active Pharmacy Records  albuterol (PROVENTIL) (2.5 MG/3ML) 0.083% nebulizer solution [id number] No Take 2.5 mg by nebulization every 6 (six) hours as needed for wheezing or shortness of breath.  [provider] Taking Active Pharmacy Records  aspirin EC 81 MG tablet [id number] No Take 1 tablet (81 mg total) by mouth daily. Theora Gianotti, NP Taking Active Pharmacy Records  clopidogrel (PLAVIX) 75 MG tablet [id number]  TAKE 1 TABLET(75 MG) BY MOUTH DAILY WITH BREAKFAST [name], MD  Active   cyclobenzaprine (FLEXERIL) 10 MG tablet [id number]  Take 1 tablet (10 mg total) by mouth 2 (two) times daily as needed. Molli Barrows, MD  Active   dexlansoprazole [facility]) 60 MG capsule [id number] No Take 1 capsule (60 mg total) by mouth daily. [name], MD Taking Active   DULoxetine (CYMBALTA) 60 MG capsule [id number] No Take 1 capsule (60 mg total) by mouth daily. [name], MD Taking Active   ezetimibe (ZETIA) 10 MG tablet [id number] No Take 1 tablet (10 mg total) by mouth daily. [name], MD Taking Active   Fluticasone-Umeclidin-Vilant (TRELEGY ELLIPTA) 100-62.5-25 MCG/INH AEPB [id number] No Inhale 1 each into the lungs daily. In place of breo [name], MD Taking Active            Med Note Colbert Ewing, Mirian Mo   Mon Apr 14, 2021  7:06 AM) Not used in 6 months   furosemide (LASIX) 20 MG tablet [id number]  TAKE 1 TABLET BY MOUTH DAILY AS NEEDED FOR WEIGHT GAIN OF 2 POUNDS OVERNIGHT OR 5 POUNDS PER 1 WEEK Arida, Mertie Clause, MD  Active   gabapentin (NEURONTIN) 300 MG capsule [id number] No Take 1 capsule (300 mg total) by mouth 3 (three) times daily. Roselee Nova, MD Taking  Active Pharmacy Records           Med Note ([place], Lahoma Rocker   Thu Feb 09, 2019 10:51 AM)    gabapentin (NEURONTIN) 800 MG tablet [id number] No Take 800 mg by mouth at bedtime. Mehrabyan, Anahit Cheluskin, MD Taking Active   insulin lispro (HUMALOG) 100 UNIT/ML KiwkPen [id number] No Inject  7-8 Units into the skin 3 (three) times daily. Reported on 12/02/2015/ sliding scale 1 unit for every 7 units of carbs; and 1 unit for every 20 above 120 [provider] Taking Active Pharmacy Records  Insulin Pen Needle 32G X 4 MM MISC [id number] No Inject 1 each into the skin as needed. [provider] Taking Active Pharmacy Records  JARDIANCE 10 MG TABS tablet [id number]  TAKE 1 TABLET(10 MG) BY MOUTH DAILY BEFORE BREAKFAST [name], MD  Active   LANTUS SOLOSTAR 100 UNIT/ML Solostar Pen [id number] No Inject 31-33 Units into the skin daily at 10 pm. Sherri Sear, MD Taking Active Pharmacy Records           Med Note [facility] [place], Lahoma Rocker   Thu Feb 09, 2019 10:52 AM)    levocetirizine (XYZAL) 5 MG tablet [id number] No TAKE 1 TABLET(5 MG) BY MOUTH EVERY Lorelei Pont, Drue Stager, MD Taking Active   levothyroxine (SYNTHROID) 50 MCG tablet [id number] No Take 1 tablet (50 mcg total) by mouth daily before breakfast. One daily and two on Sundays - recheck level in 6 weeks [name], MD Taking Active   loratadine (CLARITIN) 10 MG tablet [id number] No Take 10 mg by mouth daily.  [provider] Taking Active Pharmacy Records  metFORMIN (GLUCOPHAGE) 1000 MG tablet [id number] No Take 1,000 mg by mouth 2 (two) times daily with a meal. Sherri Sear, MD Taking Active Pharmacy Records  metoprolol succinate (TOPROL-XL) 100 MG 24 hr tablet [id number] No TAKE 1 TABLET(100 MG) BY MOUTH DAILY WITH OR IMMEDIATELY FOLLOWING A MEAL Loel Dubonnet, NP Taking Active   montelukast (SINGULAIR) 10 MG tablet [id number] No Take 10 mg by mouth at bedtime. [provider]  Taking Active   montelukast (SINGULAIR) 10 MG tablet [id number] No Take 1 tablet by mouth at bedtime. [provider] Taking Active   Multiple Vitamins-Minerals (MENS MULTI VITAMIN & MINERAL PO) [id number] No Take 1 tablet by mouth in the morning and at bedtime. [provider] Taking Active   Na Sulfate-K Sulfate-Mg Sulf (SUPREP BOWEL PREP KIT) 17.5-3.13-1.6 GM/177ML SOLN [id number] No Take 1 kit by mouth as directed. [name], MD Taking Active   naloxone [facility]) nasal spray 4 mg/0.1 mL [id number] No For excess sedation from opioids Molli Barrows, MD Taking Active Pharmacy Records           Med Note ([place], Lahoma Rocker   Mon Dec 09, 2020  1:24 PM)    nitroGLYCERIN (NITROSTAT) 0.3 MG SL tablet [id number] No Place 1 tablet (0.3 mg total) under the tongue every 5 (five) minutes as needed for chest pain. Maximum 3 doses. Theora Gianotti, NP Taking Active   Oxycodone HCl 10 MG TABS [id number]  Take 1 tablet (10 mg total) by mouth in the morning, at noon, in the evening, and at bedtime. Molli Barrows, MD  Active   Oxycodone HCl 10 MG TABS [id number]  Take 1 tablet (10 mg total) by mouth in the morning, at noon, in the evening, and at bedtime. Molli Barrows, MD  Active   rosuvastatin (CRESTOR) 5 MG tablet [id number] No Take 5 mg by mouth daily. [name], MD Taking Active Pharmacy Records  Semaglutide,0.25 or 0.5MG /DOS, 2 MG/1.5ML Bonney Aid [id number] No Inject 1 mg into the skin once a week. [provider] Taking Active   tamsulosin (FLOMAX) 0.4 MG CAPS capsule [id number] No TAKE 2 CAPSULES BY MOUTH DAILY Stoioff, Ronda Fairly, MD Taking Active  Med List Note Landis Martins, RN 09/11/20 1444): uds 02-20-21 MR 11-10-20            Patient Active Problem List   Diagnosis Date Noted   Iron deficiency anemia due to chronic blood loss    Pain due to onychomycosis of toenails of both feet 11/11/2020   Lung involvement associated with another disorder ([place])  05/17/2020   Left leg weakness 12/01/2019   Left arm weakness 10/04/2019   Lower urinary tract symptoms 09/15/2019   Incomplete bladder emptying 09/15/2019   Carpal tunnel syndrome on both sides 07/12/2019   Weakness generalized 06/21/2019   Idiopathic peripheral neuropathy 04/21/2019   Sensory ataxia 03/09/2019   Colitis 07/08/2018   OSA on CPAP 07/08/2018   Congestive heart failure (CHF) ([place]) 05/05/2018   Unstable angina ([place]) 04/26/2018   Ischemic cardiomyopathy 04/26/2018   Chronic combined systolic and diastolic CHF (congestive heart failure) ([place]) 04/26/2018   Effort angina ([place]) 04/25/2018   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[place]) 01/25/2018   Hyperlipidemia due to type 2 diabetes mellitus ([place]) 08/12/2017   Vitamin D deficiency, unspecified 08/12/2017   Ptosis of left eyelid 01/08/2017   Dermatitis 12/24/2016   Difficulty walking 04/27/2016   Foot cramps 04/27/2016   Morbid (severe) obesity due to excess calories ([place]) 04/06/2016   Benign neoplasm of sigmoid colon    Benign neoplasm of descending colon    Benign neoplasm of transverse colon    Coronary artery disease involving native coronary artery with angina pectoris ([place]) 10/22/2015   Coronary artery disease 08/22/2015   DM (diabetes mellitus), type 2, uncontrolled with complications ([place]) 38/07/1750   Myasthenia gravis ([place]) 08/22/2015   Chronic left-sided low back pain 08/22/2015   Hypertension 08/22/2015   GERD (gastroesophageal reflux disease) 08/22/2015   Arthritis 08/22/2015   Diabetic peripheral neuropathy ([place]) 08/22/2015   Hyperlipidemia 08/22/2015   Insomnia 08/22/2015   Type 2 diabetes mellitus with diabetic polyneuropathy, with long-term current use of insulin ([place]) 08/22/2015   Asthma 08/22/2015   Lyme disease 06/05/1992    Immunization History  Administered Date(s) Administered   Influenza Inj Mdck Quad Pf 06/14/2019   Influenza,inj,Quad PF,6+ Mos 06/16/2017, 06/10/2018,  07/23/2020   PFIZER(Purple Top)SARS-COV-2 Vaccination 12/22/2019, 01/16/2020, 08/05/2020   Pneumococcal Conjugate-13 07/21/2019   Pneumococcal Polysaccharide-23 01/25/2018   Td 04/04/2013    Conditions to be addressed/monitored:  Hypertension, Hyperlipidemia, Diabetes, Heart Failure, Coronary Artery Disease and Asthma  There are no care plans that you recently modified to display for this patient.    Medication Assistance: Application for Ozempic, Lantus, Humalog, Trelegy, Dexilant, and Jardiance  medication assistance program. in process.  Anticipated assistance start date TBD.  See plan of care for additional detail.  Compliance/Adherence/Medication fill history: Care Gaps: Shingrix Urine Microalbumin Ophthalmology Colonoscopy   Star-Rating Drugs: Rosuvastatin 5 mg daily: LF 01/16/21 for 90-DS Metformin 1000 mg twice daily: LF 01/16/21 for 90-DS  Patient's preferred pharmacy is:  [id number] [address], [place] [address] Phone: [phone] Fax: [phone]  Uses pill box? Yes Pt endorses 100% compliance  We discussed: Current pharmacy is preferred with insurance plan and patient is satisfied with pharmacy services Patient decided to: Continue current medication management strategy  Care Plan and Follow Up Patient Decision:  Patient agrees to Care Plan and Follow-up.  Plan: Telephone follow up appointment with care management team member scheduled for:  06/11/2021 at  2:00 PM  Doristine Section, [facility] [phone]  Current Barriers:  Unable to independently afford treatment regimen  Pharmacist Clinical Goal(s):  Over the next 90 days, patient will verbalize ability to afford treatment regimen through collaboration with PharmD and provider.   Interventions: 1:1 collaboration with [name], MD regarding development  and update of comprehensive plan of care as evidenced by provider attestation and co-signature Inter-disciplinary care team collaboration (see longitudinal plan of care) Comprehensive medication review performed; medication list updated in electronic medical record  Hyperlipidemia: (LDL goal < 70) -History of MI in 2010 -Controlled -Current treatment: Ezetimibe 10 mg daily  Rosuvastatin 5 mg daily  -Current antiplatelet treatment: Aspirin 81 mg daily  Clopidogrel 75 mg daily  -Medications previously tried: NA  -Cramping on higher doses of rosuvastatin  -Educated on Importance of limiting foods high in cholesterol; Exercise goal of 150 minutes per week; -Recommended to continue current medication  Diabetes (A1c goal <8%) -Controlled -Current medications: Humalog 1 unit per 8g of carb, 1 unit for every 20 glucose above 120 three times daily  Lantus 20-23 units daily  Jardiance 10 mg daily  Metformin 1000 mg twice daily  Ozempic 1 mg weekly  -Medications previously tried: Victoza  -Current home glucose readings fasting glucose: 85-120 -Denies hypoglycemic/hyperglycemic symptoms -Tolerating Ozempic dose increase, Jardiance start well.  -Educated on Prevention and management of hypoglycemic episodes; Carbohydrate counting and/or plate method -Counseled to check feet daily and get yearly eye exams -Recommended to continue current medication  Heart Failure (Goal: control symptoms and prevent exacerbations) -Controlled Type: Systolic -NYHA Class: II (slight limitation of activity) -Ejection fraction: 35-40% (Date: 12/03/20) -Current treatment: Furosemide 20 mg daily as needed Metoprolol XL 100 mg daily  -Medications previously tried: bisprolol, HCTZ, carvedilol  -Current home BP/HR readings: NA -Current dietary habits: Following low carb, low sodium diet.  -Significant dizziness when taking furosemide.  -Educated on Importance of weighing daily; if you gain more than 3 pounds  in one day or 5 pounds in one week, contact provider  -Recommended to continue current medication  Asthma/COPD (Goal: control symptoms and prevent exacerbations) -Controlled -Current treatment  Albuterol 2 puffs every 6 hours as needed  Albuterol 3 mL nebulizer every 6 hours as needed  Montelukast 10 mg nightly Trelegy 1 puff daily  -Current allergic rhinitis treatment  Levocetirizine 5 mg nightly  Loratadine 10 mg daily   -Medications previously tried: NA  -Pulmonary function testing: NA -Exacerbations requiring treatment in last 6 months: yes, multiple steroid bursts  -Patient reports consistent use of maintenance inhaler -Frequency of rescue inhaler use: ~1-2 times weekly  -Counseled on Proper inhaler technique; -Recommended to continue current medication  GERD (Goal: Prevent heartburn or reflux symptoms) -Controlled -Current treatment  Dexlansoprazole 60 mg daily  -Medications previously tried: omeprazole (ineffective)   -Significant rebound symptoms if skips daily  -Recommended to continue current medication  Chronic Pain (Goal: Maintain adequate pain relief) -Controlled -Current treatment  Cyclobenzaprine 10 mg twice daily as needed  Gabapentin 300 mg 1 capsule three times daily  Gabapentin 800 mg nightly  Oxycodone 10 mg four times daily  -Medications previously tried: NA -Counseled patient on risk of CNS depression, especially with combined use of oxycodone with gabapentin, cyclobenzaprine, or alcohol use.   -Recommended to continue current medication  Urinary Obstruction (Goal: maintain stable urine flow) -Controlled -Current treatment  Tamsulosin 0.4 mg 2 capsules daily  -Medications previously tried: NA  -Much improved since starting tamsulosin. Could consider decreased  to 0.4 mg daily if symptoms stable to minimize risk of dizziness, will address at future visit.  -Recommended to continue current medication  Depression/Anxiety (Goal: achieve stable  mood) -Uncontrolled -Current treatment: Duloxetine 60 mg daily  -Medications previously tried/failed: NA -PHQ9: 8 -GAD7: 5 -Connected with none for mental health support. Planning on establishing with behavioral health, but has not set up yet.  -Educated on Benefits of medication for symptom control Benefits of cognitive-behavioral therapy with or without medication -Recommended to continue current medication   Patient Goals/Self-Care Activities Over the next 90 days, patient will:  - check glucose at least 3 times daily , document, and provide at future appointments check blood pressure weekly , document, and provide at future appointments weigh daily, and contact provider if weight gain of greater than 3 pounds in one day  Follow Up Plan: ***

## 2021-06-18 ENCOUNTER — Other Ambulatory Visit: Payer: Self-pay | Admitting: Cardiovascular Disease

## 2021-06-18 DIAGNOSIS — I5022 Chronic systolic (congestive) heart failure: Secondary | ICD-10-CM

## 2021-06-19 ENCOUNTER — Other Ambulatory Visit: Payer: Self-pay | Admitting: Family Medicine

## 2021-06-19 NOTE — Telephone Encounter (Signed)
You saw 6/29

## 2021-06-20 ENCOUNTER — Other Ambulatory Visit: Payer: Self-pay | Admitting: Family Medicine

## 2021-06-23 ENCOUNTER — Encounter: Payer: Self-pay | Admitting: Family Medicine

## 2021-06-23 ENCOUNTER — Telehealth (INDEPENDENT_AMBULATORY_CARE_PROVIDER_SITE_OTHER): Payer: Medicare Other | Admitting: Family Medicine

## 2021-06-23 DIAGNOSIS — J45901 Unspecified asthma with (acute) exacerbation: Secondary | ICD-10-CM

## 2021-06-23 NOTE — Progress Notes (Signed)
Virtual Visit via Video Note  I connected with [name] on 06/23/21 at  9:00 AM EDT by a video enabled telemedicine application and verified that I am speaking with the correct person using two identifiers.  Location: Patient: home Provider: [facility]   I discussed the limitations of evaluation and management by telemedicine and the availability of in person appointments. The patient expressed understanding and agreed to proceed.  History of Present Illness:  UPPER RESPIRATORY TRACT INFECTION - tripped and fell and hit ribs, difficult to get deep breath, subsequently developed congestion. Called Pulm and received medrol dose pack and Zpack, started on Friday, has about 2 days of abx left and 3 days of steroid left.  - Congestion improving. Still with cough.  - initial symptom onset 10 days ago.  - has not tested for COVID. - h/o asthma, CHF with EF 30%, myasthenia gravis, OSA  Fever: no Cough: yes, productive of green sputum. Shortness of breath:  none more than usual Chest pain: no Chest congestion: yes Nasal congestion: yes Runny nose: yes Ear pain: no  Ear pressure: no  Vomiting: no Context: better Treatments attempted: antibiotics, steroids     Observations/Objective:  Well appearing, in NAD. Speaks in full sentences, no resp distress.   Assessment and Plan:  Asthma with acute exacerbation Improving. Not a candidate for COVID treatment due to duration of symptoms, outside of quarantine window, will defer testing. Continue antibiotics, steroids. Recommend addition of mucinex.  Reviewed return and emergency precautions.     I discussed the assessment and treatment plan with the patient. The patient was provided an opportunity to ask questions and all were answered. The patient agreed with the plan and demonstrated an understanding of the instructions.   The patient was advised to call back or seek an in-person evaluation if the symptoms worsen or if the condition  fails to improve as anticipated.  I provided 9 minutes of non-face-to-face time during this encounter.   Myles Gip, DO

## 2021-06-23 NOTE — Patient Instructions (Signed)
It was great to see you!  Our plans for today:  - Continue your steroids and antibiotics.  - Try mucinex. - Come by for an in-person visit if your symptoms are no better after completing your steroids and antibiotics.   Take care and seek immediate care sooner if you develop any concerns.   Dr. Ky Barban

## 2021-06-25 DIAGNOSIS — Z794 Long term (current) use of insulin: Secondary | ICD-10-CM | POA: Diagnosis not present

## 2021-06-25 DIAGNOSIS — E1165 Type 2 diabetes mellitus with hyperglycemia: Secondary | ICD-10-CM | POA: Diagnosis not present

## 2021-07-01 ENCOUNTER — Telehealth: Payer: Self-pay

## 2021-07-01 NOTE — Progress Notes (Signed)
Chronic Care Management Pharmacy Assistant   Name: [name]  MRN: [id number] DOB: 12/25/1960  Reason for Encounter:Diabetes Disease State Call.  Recent office visits:  06/23/2021 Ellwood Dense DO (PCP Office) No medication Changes noted  Recent consult visits:  06/18/2021 Dr.Fleming MD (Pulmonology) start Azithromycin 250 MG ,start methylprednisolone 4 mg   Hospital visits:  None in previous 6 months  Medications: Outpatient Encounter Medications as of 07/01/2021  Medication Sig Note   albuterol (PROVENTIL HFA;VENTOLIN HFA) 108 (90 Base) MCG/ACT inhaler Inhale 2 puffs into the lungs every 6 (six) hours as needed for wheezing or shortness of breath.     albuterol (PROVENTIL) (2.5 MG/3ML) 0.083% nebulizer solution Take 2.5 mg by nebulization every 6 (six) hours as needed for wheezing or shortness of breath.     aspirin EC 81 MG tablet Take 1 tablet (81 mg total) by mouth daily.    clopidogrel (PLAVIX) 75 MG tablet TAKE 1 TABLET(75 MG) BY MOUTH DAILY WITH BREAKFAST    dexlansoprazole (DEXILANT) 60 MG capsule Take 1 capsule (60 mg total) by mouth daily.    DULoxetine (CYMBALTA) 60 MG capsule Take 1 capsule (60 mg total) by mouth daily.    ezetimibe (ZETIA) 10 MG tablet TAKE 1 TABLET(10 MG) BY MOUTH DAILY    Fluticasone-Umeclidin-Vilant (TRELEGY ELLIPTA) 100-62.5-25 MCG/INH AEPB Inhale 1 each into the lungs daily. In place of breo 04/14/2021: Not used in 6 months    furosemide (LASIX) 20 MG tablet TAKE 1 TABLET BY MOUTH DAILY AS NEEDED FOR WEIGHT GAIN OF 2 POUNDS OVERNIGHT OR 5 POUNDS PER 1 WEEK    gabapentin (NEURONTIN) 300 MG capsule Take 1 capsule (300 mg total) by mouth 3 (three) times daily.    gabapentin (NEURONTIN) 800 MG tablet Take 800 mg by mouth at bedtime.    insulin lispro (HUMALOG) 100 UNIT/ML KiwkPen Inject 7-8 Units into the skin 3 (three) times daily. Reported on 12/02/2015/ sliding scale 1 unit for every 7 units of carbs; and 1 unit for every 20 above 120     Insulin Pen Needle 32G X 4 MM MISC Inject 1 each into the skin as needed.    JARDIANCE 10 MG TABS tablet TAKE 1 TABLET(10 MG) BY MOUTH DAILY BEFORE BREAKFAST    LANTUS SOLOSTAR 100 UNIT/ML Solostar Pen Inject 31-33 Units into the skin daily at 10 pm.    levocetirizine (XYZAL) 5 MG tablet TAKE 1 TABLET(5 MG) BY MOUTH EVERY EVENING    levothyroxine (SYNTHROID) 50 MCG tablet TAKE 1 TABLET BY MOUTH DAILY BEFORE BREAKFAST AND 2 TABLETS ON SUNDAYS. RECHECK LEVEL IN 6 WEEKS    loratadine (CLARITIN) 10 MG tablet Take 10 mg by mouth daily.     metFORMIN (GLUCOPHAGE) 1000 MG tablet Take 1,000 mg by mouth 2 (two) times daily with a meal.    metoprolol succinate (TOPROL-XL) 100 MG 24 hr tablet TAKE 1 TABLET(100 MG) BY MOUTH DAILY WITH OR IMMEDIATELY FOLLOWING A MEAL    montelukast (SINGULAIR) 10 MG tablet Take 10 mg by mouth at bedtime.    montelukast (SINGULAIR) 10 MG tablet Take 1 tablet by mouth at bedtime.    Multiple Vitamins-Minerals (MENS MULTI VITAMIN & MINERAL PO) Take 1 tablet by mouth in the morning and at bedtime.    Na Sulfate-K Sulfate-Mg Sulf (SUPREP BOWEL PREP KIT) 17.5-3.13-1.6 GM/177ML SOLN Take 1 kit by mouth as directed.    naloxone (NARCAN) nasal spray 4 mg/0.1 mL For excess sedation from opioids    nitroGLYCERIN (NITROSTAT)  0.3 MG SL tablet Place 1 tablet (0.3 mg total) under the tongue every 5 (five) minutes as needed for chest pain. Maximum 3 doses.    Oxycodone HCl 10 MG TABS Take 1 tablet (10 mg total) by mouth in the morning, at noon, in the evening, and at bedtime.    rosuvastatin (CRESTOR) 5 MG tablet Take 5 mg by mouth daily.    Semaglutide,0.25 or 0.5MG /DOS, 2 MG/1.5ML SOPN Inject 1 mg into the skin once a week.    tamsulosin (FLOMAX) 0.4 MG CAPS capsule TAKE 2 CAPSULES BY MOUTH DAILY    No facility-administered encounter medications on file as of 07/01/2021.    Care Gaps: Zoster Vaccines Ophthalmology Exam (last completed 08/02/2019) COVID-19 Vaccine Booster 4 Influenza  Vaccine (last completed 07/23/2020) Star Rating Drug: Jardiance 10 mg last filled on 06/15/2021 for a 30-Day supply with [facility] Semaglutide 1 mg last filled on 06/24/2021 for a 28-Day supply with [facility] Rosuvastatin 5 mg last filled on 04/18/2021 for a 90-Day supply with [facility] Metformin 1000 mg last filled on 04/18/2021 for a 90-Day supply with [facility] Medication Fill Gaps: None  Recent Relevant Labs: Lab Results  Component Value Date/Time   HGBA1C 7.9 01/29/2021 12:00 AM   HGBA1C 8.4 (A) 09/16/2020 11:47 AM   HGBA1C 7.5 (H) 10/16/2019 02:24 PM   HGBA1C 7.9 (H) 05/05/2019 10:12 AM   HGBA1C 7.8 10/06/2018 12:00 AM   MICROALBUR 1.4 05/05/2019 10:12 AM   MICROALBUR 204 05/18/2018 12:00 AM   MICROALBUR 76.1 05/18/2018 12:00 AM   MICROALBUR Neg 01/25/2018 11:59 AM    Kidney Function Lab Results  Component Value Date/Time   CREATININE 1.22 12/25/2020 11:35 AM   CREATININE 1.21 12/10/2020 10:28 AM   CREATININE 1.64 (H) 10/16/2019 02:24 PM   CREATININE 1.54 (H) 05/05/2019 10:12 AM   GFRNONAA >60 12/25/2020 11:35 AM   GFRNONAA 45 (L) 10/16/2019 02:24 PM   GFRAA 61 09/17/2020 11:17 AM   GFRAA 52 (L) 10/16/2019 02:24 PM    Current antihyperglycemic regimen:  Humalog 1 unit per 8g of carb, 1 unit for every 20 glucose above 120 three times daily  Lantus 20-23 units daily  Jardiance 10 mg daily  Metformin 1000 mg twice daily  Ozempic 1 mg weekly  What recent interventions/DTPs have been made to improve glycemic control:  None Have there been any recent hospitalizations or ED visits since last visit with CPP? No  I have attempted without success to contact this patient by phone three times to do his Diabetes Disease State call. I left a Voice message for patient to return my call.LVM 09/27,09/28,09/29  Adherence Review: Is the patient currently on a STATIN medication? Yes Is the patient currently on ACE/ARB medication?  No Does the patient have >5 day gap between last estimated fill dates? No  Anderson [place] Clinical Production designer, theatre/television/film [phone]

## 2021-07-08 ENCOUNTER — Ambulatory Visit: Payer: Medicare Other | Attending: Anesthesiology | Admitting: Anesthesiology

## 2021-07-08 ENCOUNTER — Other Ambulatory Visit: Payer: Self-pay

## 2021-07-08 ENCOUNTER — Encounter: Payer: Self-pay | Admitting: Anesthesiology

## 2021-07-08 ENCOUNTER — Ambulatory Visit: Payer: Medicare Other

## 2021-07-08 DIAGNOSIS — M4716 Other spondylosis with myelopathy, lumbar region: Secondary | ICD-10-CM

## 2021-07-08 DIAGNOSIS — G894 Chronic pain syndrome: Secondary | ICD-10-CM

## 2021-07-08 DIAGNOSIS — A6923 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: Secondary | ICD-10-CM | POA: Diagnosis not present

## 2021-07-08 DIAGNOSIS — F1190 Opioid use, unspecified, uncomplicated: Secondary | ICD-10-CM

## 2021-07-08 DIAGNOSIS — M47817 Spondylosis without myelopathy or radiculopathy, lumbosacral region: Secondary | ICD-10-CM | POA: Diagnosis not present

## 2021-07-08 DIAGNOSIS — M545 Low back pain, unspecified: Secondary | ICD-10-CM | POA: Diagnosis not present

## 2021-07-08 DIAGNOSIS — M5432 Sciatica, left side: Secondary | ICD-10-CM | POA: Diagnosis not present

## 2021-07-08 DIAGNOSIS — G7000 Myasthenia gravis without (acute) exacerbation: Secondary | ICD-10-CM

## 2021-07-08 DIAGNOSIS — M542 Cervicalgia: Secondary | ICD-10-CM

## 2021-07-08 DIAGNOSIS — M5136 Other intervertebral disc degeneration, lumbar region: Secondary | ICD-10-CM

## 2021-07-08 DIAGNOSIS — R29898 Other symptoms and signs involving the musculoskeletal system: Secondary | ICD-10-CM

## 2021-07-08 MED ORDER — OXYCODONE HCL 10 MG PO TABS: 10.0000 mg | ORAL_TABLET | Freq: Four times a day (QID) | ORAL | 0 refills | Status: AC

## 2021-07-08 NOTE — Progress Notes (Signed)
Virtual Visit via Telephone Note  I connected with [name] on 07/08/21 at  1:35 PM EDT by telephone and verified that I am speaking with the correct person using two identifiers.  Location: Patient: Home Provider: Pain control center   I discussed the limitations, risks, security and privacy concerns of performing an evaluation and management service by telephone and the availability of in person appointments. I also discussed with the patient that there may be a patient responsible charge related to this service. The patient expressed understanding and agreed to proceed.   History of Present Illness: I spoke with him mother over the phone and we are unable to do the video portion of virtual conference but he reports that he is having a lot more sciatica that is recurrent for him affecting the right lower leg.  He had an epidural back in July and had excellent success with this rating approximately 100% relief for about 2 to 3 weeks and then 75% improvement over the next 2 to 3 months.  He did recently stumble and jarred his right leg and this is caused some recurrence of the similar symptoms.  He desires to return to clinic at the next available date for repeat epidural.  No change in lower extremity strength or function of bowel or bladder function is noted at this time.  He continues take his medications as prescribed and these continue to work well for him.  He generally gets 75% improvement taking his oxycodone 4 times a day with no side effects reported.  He has failed more conservative therapy.  Otherwise no difficulty with the medication is described and otherwise he is doing well.  Review of systems: General: No fevers or chills Pulmonary: No shortness of breath or dyspnea Cardiac: No angina or palpitations or lightheadedness GI: No abdominal pain or constipation Psych: No depression    Observations/Objective:  Current Outpatient Medications:    Oxycodone HCl 10 MG TABS,  Take 1 tablet (10 mg total) by mouth 4 (four) times daily., Disp: 120 tablet, Rfl: 0   [START ON 08/07/2021] Oxycodone HCl 10 MG TABS, Take 1 tablet (10 mg total) by mouth in the morning, at noon, in the evening, and at bedtime., Disp: 120 tablet, Rfl: 0   albuterol (PROVENTIL HFA;VENTOLIN HFA) 108 (90 Base) MCG/ACT inhaler, Inhale 2 puffs into the lungs every 6 (six) hours as needed for wheezing or shortness of breath. , Disp: , Rfl:    albuterol (PROVENTIL) (2.5 MG/3ML) 0.083% nebulizer solution, Take 2.5 mg by nebulization every 6 (six) hours as needed for wheezing or shortness of breath. , Disp: , Rfl:    aspirin EC 81 MG tablet, Take 1 tablet (81 mg total) by mouth daily., Disp: 90 tablet, Rfl: 3   clopidogrel (PLAVIX) 75 MG tablet, TAKE 1 TABLET(75 MG) BY MOUTH DAILY WITH BREAKFAST, Disp: 30 tablet, Rfl: 2   dexlansoprazole (DEXILANT) 60 MG capsule, Take 1 capsule (60 mg total) by mouth daily., Disp: 30 capsule, Rfl: 11   DULoxetine (CYMBALTA) 60 MG capsule, Take 1 capsule (60 mg total) by mouth daily., Disp: 90 capsule, Rfl: 1   ezetimibe (ZETIA) 10 MG tablet, TAKE 1 TABLET(10 MG) BY MOUTH DAILY, Disp: 90 tablet, Rfl: 3   Fluticasone-Umeclidin-Vilant (TRELEGY ELLIPTA) 100-62.5-25 MCG/INH AEPB, Inhale 1 each into the lungs daily. In place of breo, Disp: 60 each, Rfl: 2   furosemide (LASIX) 20 MG tablet, TAKE 1 TABLET BY MOUTH DAILY AS NEEDED FOR WEIGHT GAIN OF 2 POUNDS  OVERNIGHT OR 5 POUNDS PER 1 WEEK, Disp: 90 tablet, Rfl: 0   gabapentin (NEURONTIN) 300 MG capsule, Take 1 capsule (300 mg total) by mouth 3 (three) times daily., Disp: 90 capsule, Rfl: 1   gabapentin (NEURONTIN) 800 MG tablet, Take 800 mg by mouth at bedtime., Disp: , Rfl:    insulin lispro (HUMALOG) 100 UNIT/ML KiwkPen, Inject 7-8 Units into the skin 3 (three) times daily. Reported on 12/02/2015/ sliding scale 1 unit for every 7 units of carbs; and 1 unit for every 20 above 120, Disp: , Rfl:    Insulin Pen Needle 32G X 4 MM MISC,  Inject 1 each into the skin as needed., Disp: , Rfl:    JARDIANCE 10 MG TABS tablet, TAKE 1 TABLET(10 MG) BY MOUTH DAILY BEFORE BREAKFAST, Disp: 30 tablet, Rfl: 0   LANTUS SOLOSTAR 100 UNIT/ML Solostar Pen, Inject 31-33 Units into the skin daily at 10 pm., Disp: , Rfl: 0   levocetirizine (XYZAL) 5 MG tablet, TAKE 1 TABLET(5 MG) BY MOUTH EVERY EVENING, Disp: 30 tablet, Rfl: 3   levothyroxine (SYNTHROID) 50 MCG tablet, TAKE 1 TABLET BY MOUTH DAILY BEFORE BREAKFAST AND 2 TABLETS ON SUNDAYS. RECHECK LEVEL IN 6 WEEKS, Disp: 32 tablet, Rfl: 1   loratadine (CLARITIN) 10 MG tablet, Take 10 mg by mouth daily. , Disp: , Rfl:    metFORMIN (GLUCOPHAGE) 1000 MG tablet, Take 1,000 mg by mouth 2 (two) times daily with a meal., Disp: , Rfl:    metoprolol succinate (TOPROL-XL) 100 MG 24 hr tablet, TAKE 1 TABLET(100 MG) BY MOUTH DAILY WITH OR IMMEDIATELY FOLLOWING A MEAL, Disp: 90 tablet, Rfl: 2   montelukast (SINGULAIR) 10 MG tablet, Take 10 mg by mouth at bedtime., Disp: , Rfl:    montelukast (SINGULAIR) 10 MG tablet, Take 1 tablet by mouth at bedtime., Disp: , Rfl:    Multiple Vitamins-Minerals (MENS MULTI VITAMIN & MINERAL PO), Take 1 tablet by mouth in the morning and at bedtime., Disp: , Rfl:    Na Sulfate-K Sulfate-Mg Sulf (SUPREP BOWEL PREP KIT) 17.5-3.13-1.6 GM/177ML SOLN, Take 1 kit by mouth as directed., Disp: 354 mL, Rfl: 0   naloxone (NARCAN) nasal spray 4 mg/0.1 mL, For excess sedation from opioids, Disp: 1 kit, Rfl: 2   nitroGLYCERIN (NITROSTAT) 0.3 MG SL tablet, Place 1 tablet (0.3 mg total) under the tongue every 5 (five) minutes as needed for chest pain. Maximum 3 doses., Disp: 25 tablet, Rfl: 3   rosuvastatin (CRESTOR) 5 MG tablet, Take 5 mg by mouth daily., Disp: , Rfl: 0   Semaglutide,0.25 or 0.5MG /DOS, 2 MG/1.5ML SOPN, Inject 1 mg into the skin once a week., Disp: , Rfl:    tamsulosin (FLOMAX) 0.4 MG CAPS capsule, TAKE 2 CAPSULES BY MOUTH DAILY, Disp: 60 capsule, Rfl: 2   Assessment and  Plan: 1. Left leg weakness   2. Lumbar spondylosis with myelopathy   3. DDD (degenerative disc disease), lumbar   4. Chronic pain syndrome   5. Chronic, continuous use of opioids   6. Sciatica, left side   7. Cervicalgia   8. Facet arthritis of lumbosacral region   9. 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 Left arm weakness   12. Low back pain at multiple sites   Based on the discussion today and after review of the [facility] practitioner database information I think is appropriate for refill for his medications.  These were dated from 2014 and November 3.  No other changes in his pain management protocol will be initiated at this time other than having her return to clinic in approximately 1 to 2 weeks for repeat epidural.  He will need to come off of his Plavix in advance of that as discussed with him in detail today.  He is due to see his cardiologist here in the next few days he mentions.  Continue core stretching strengthening exercises and continue follow-up with his primary care physicians for baseline medical care.  Follow Up Instructions:    I discussed the assessment and treatment plan with the patient. The patient was provided an opportunity to ask questions and all were answered. The patient agreed with the plan and demonstrated an understanding of the instructions.   The patient was advised to call back or seek an in-person evaluation if the symptoms worsen or if the condition fails to improve as anticipated.  I provided 30 minutes of non-face-to-face time during this encounter.   Molli Barrows, MD

## 2021-07-16 ENCOUNTER — Other Ambulatory Visit: Payer: Self-pay | Admitting: Cardiovascular Disease

## 2021-07-17 ENCOUNTER — Encounter: Payer: Self-pay | Admitting: Nurse Practitioner

## 2021-07-17 ENCOUNTER — Other Ambulatory Visit: Payer: Self-pay

## 2021-07-17 ENCOUNTER — Ambulatory Visit: Payer: Medicare Other | Admitting: Nurse Practitioner

## 2021-07-17 VITALS — BP 100/68 | HR 98 | Ht 67.0 in | Wt 218.2 lb

## 2021-07-17 DIAGNOSIS — I10 Essential (primary) hypertension: Secondary | ICD-10-CM | POA: Diagnosis not present

## 2021-07-17 DIAGNOSIS — E785 Hyperlipidemia, unspecified: Secondary | ICD-10-CM | POA: Diagnosis not present

## 2021-07-17 DIAGNOSIS — I255 Ischemic cardiomyopathy: Secondary | ICD-10-CM | POA: Diagnosis not present

## 2021-07-17 DIAGNOSIS — I5022 Chronic systolic (congestive) heart failure: Secondary | ICD-10-CM

## 2021-07-17 DIAGNOSIS — I25118 Atherosclerotic heart disease of native coronary artery with other forms of angina pectoris: Secondary | ICD-10-CM | POA: Diagnosis not present

## 2021-07-17 DIAGNOSIS — I5042 Chronic combined systolic (congestive) and diastolic (congestive) heart failure: Secondary | ICD-10-CM | POA: Diagnosis not present

## 2021-07-17 NOTE — Patient Instructions (Signed)
Medication Instructions:  - Your physician recommends that you continue on your current medications as directed. Please refer to the Current Medication list given to you today.  *If you need a refill on your cardiac medications before your next appointment, please call your pharmacy*   Lab Work: - Your physician recommends that you have lab work today: CMET/ Lipids/ Direct LDL  If you have labs (blood work) drawn today and your tests are completely normal, you will receive your results only by: MyChart Message (if you have MyChart) OR A paper copy in the mail If you have any lab test that is abnormal or we need to change your treatment, we will call you to review the results.   Testing/Procedures: - none ordered   Follow-Up: At [facility], you and your health needs are our priority.  As part of our continuing mission to provide you with exceptional heart care, we have created designated Provider Care Teams.  These Care Teams include your primary Cardiologist (physician) and Advanced Practice Providers (APPs -  Physician Assistants and Nurse Practitioners) who all work together to provide you with the care you need, when you need it.  We recommend signing up for the patient portal called "MyChart".  Sign up information is provided on this After Visit Summary.  MyChart is used to connect with patients for Virtual Visits (Telemedicine).  Patients are able to view lab/test results, encounter notes, upcoming appointments, etc.  Non-urgent messages can be sent to your provider as well.   To learn more about what you can do with MyChart, go to [email or web address].    Your next appointment:   6 month(s)  The format for your next appointment:   In Person  Provider:   You may see [name], MD or one of the following Advanced Practice Providers on your designated Care Team:   Murray Hodgkins, NP    Other Instructions N/a

## 2021-07-17 NOTE — Progress Notes (Signed)
Office Visit    Patient Name: [name] Date of Encounter: 07/17/2021  Primary Care Provider:  [name], MD Primary Cardiologist:  [name], MD  Chief Complaint    60 year old male with a history of CAD status post multiple PCI's, ischemic cardiomyopathy, HFrEF, hypertension, hyperlipidemia, type 2 diabetes mellitus, diabetic neuropathy, myasthenia gravis, sepsis, and obstructive sleep apnea, who presents for follow-up of CAD and heart failure.  Past Medical History    Past Medical History:  Diagnosis Date   Allergy    dust, seasonal (worse in the fall).   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 Followed by Pain Specialist in [place], back and neck   Asthma    BRONCHITIS   Cataract    First Dx in 2012   Chronic combined systolic and diastolic congestive heart failure ([place])    a. 03/2018 Echo: EF 30-35%; b. 07/2018 Echo: EF 35-40%; c. 09/2019 TEE: EF 40-45%; d. 12/2020 Echo: EF 35-40%, sev apical ant, apical lat, apical inf, apical HK. GrI DD, nl RV fxn.   Coronary artery disease    a. Prior Ant MI->s/p multiple stents placed in the LAD and right coronary artery ([place]); b. 2016 Cath: reportedly nonobs dzs;  c. 04/2018 Cath/PCI: LM nl, LAD 20p, patent mid stent, LCX [id number] [place] DES), OM1 nl, OM2 50, OM3 40 w/ patent stent, RCA 40p, 277m40d w/ patent stent in RPDA, RPAV 60, EF 25-35%. 2+MR; d. 02/2019 MV: Apical scar, no isch, EF 30-44%.   Deaf, left    Diabetes mellitus without complication ([facility]   TYPE 2   Diabetic peripheral neuropathy ([facility]   feet and hands   FUO (fever of unknown origin) 08/03/2018   GERD (gastroesophageal reflux disease)    Headache    muscle tension   Hyperlipidemia    Hypertension    CONTROLLED ON MEDS   Hyperthyroidism    Insomnia    Ischemic cardiomyopathy    a. 03/2018 Echo: EF 30-35%, ant, antlat, apical AK, Gr1 DD; b. 07/2018 Echo: EF 35-40%, anteroseptal, apical, and ant HK. Gr1 DD; c. 09/2019 TEE: EF 40-45%; d.  12/2020 Echo: EF 35-40%, sev apical ant, apical lat, apical inf, apical HK. GrI DD, nl RV fxn.   Knee pain, acute 05/06/2020   Left arm weakness 10/04/2019   Left leg weakness 12/01/2019   Lyme disease    Chronic   My[place]henia gravis ([facility] [place]   (03/18/21 - no current treatment - "better than it has ever been" per pt)   Myocardial infarction ([facility]   Palpitations    a. 10/2020 Zio: RSR, 88 avg. 3 brief SVT episodes (max 5 beats @ 128). Rare PACs/PVCs. Triggered events did not correlate w/ significant arrthymia - some w/ sinus tach.   Seasonal allergies    Sepsis ([facility]   a.07/2018 - unknown source. TEE neg for veg 09/2019.   Sleep apnea    CPAP   Wears hearing aid in both ears    Past Surgical History:  Procedure Laterality Date   BILATERAL CARPAL TUNNEL RELEASE Bilateral L in 2012 and R in 2013   [place] recent in  March 2016.   COLONOSCOPY WITH PROPOFOL N/A 01/10/2016   Procedure: COLONOSCOPY WITH PROPOFOL;  Surgeon: DaLucilla LameMD;  Location: [facility];  Service: Endoscopy;  Laterality: N/A;   COLONOSCOPY WITH PROPOFOL N/A 04/14/2021   Procedure: COLONOSCOPY WITH PROPOFOL;  Surgeon:  Wohl, Darren, MD;  Location: [facility];  Service: Endoscopy;  Laterality: N/A;  Diabetic - insulin and oral meds   CORONARY ANGIOPLASTY     CORONARY STENT INTERVENTION N/A 04/25/2018   Procedure: CORONARY STENT INTERVENTION;  Surgeon: Arida, Muhammad A, MD;  Location: [facility];  Service: Cardiovascular;  Laterality: N/A;   ESOPHAGOGASTRODUODENOSCOPY (EGD) WITH PROPOFOL N/A 01/10/2016   Procedure: ESOPHAGOGASTRODUODENOSCOPY (EGD) WITH PROPOFOL;  Surgeon: Darren Wohl, MD;  Location: [facility];  Service: Endoscopy;  Laterality: N/A;   ESOPHAGOGASTRODUODENOSCOPY (EGD) WITH PROPOFOL N/A 04/14/2021   Procedure: ESOPHAGOGASTRODUODENOSCOPY (EGD) WITH BIOPSY;  Surgeon: Wohl, Darren, MD;  Location: [facility];  Service: Endoscopy;   Laterality: N/A;   EYE SURGERY Bilateral 2012   cataract/bilateral vitrectomies   LEFT HEART CATH AND CORONARY ANGIOGRAPHY Left 04/25/2018   Procedure: LEFT HEART CATH AND CORONARY ANGIOGRAPHY;  Surgeon: Arida, Muhammad A, MD;  Location: [facility];  Service: Cardiovascular;  Laterality: Left;   TEE WITHOUT CARDIOVERSION N/A 09/05/2018   Procedure: TRANSESOPHAGEAL ECHOCARDIOGRAM (TEE);  Surgeon: Arida, Muhammad A, MD;  Location: [facility];  Service: Cardiovascular;  Laterality: N/A;   TONSILLECTOMY AND ADENOIDECTOMY     As a child   TUNNELED VENOUS CATHETER PLACEMENT     removed    Allergies  Allergies  Allergen Reactions   Azathioprine Other (See Comments)    Azathioprine hypersensitivity reaction - Symptoms mimicking sepsis - was hospitalized   Novolog [Insulin Aspart] Hives    History of Present Illness    60-year-old male with the above complex past medical history including CAD status post multiple PCI's, ischemic cardiomyopathy, HFrEF, hypertension, hyperlipidemia, type 2 diabetes mellitus, diabetic neuropathy, myasthenia gravis, sepsis, and obstructive sleep apnea.  He initially suffered a myocardial infarction 2009 while living in [place].  He has had approximately 10 stents between the LAD and right coronary artery over time.  In June 2019, he had worsening dyspnea on exertion and chest pain.  Echocardiogram showed an EF of 30 to 35%.  Catheterization showed a new 90% stenosis in the mid left circumflex with multiple patent LAD, RCA, and OM 2 stents.  The circumflex was successfully treated with a drug-eluting stent.  He was admitted twice in the fall 2019 in the setting of sepsis.  TEE in December 2019 showed an EF of 40 to 45% without vegetation.  Guideline directed medical therapy has been limited by hypotension and dizziness resulting in discontinuation of Entresto previously.  In October 2021, he had a syncopal episode in the setting of coughing.  He was subsequently  found to be orthostatic and was encouraged to increase p.o. fluids.  Zio monitoring in January 2022 did not show any significant arrhythmias.  Triggered events were associated with sinus tachycardia.  An echocardiogram in March 2022 continue to show LV dysfunction with an EF of 35 to 40% with severe apical anterior, apical lateral, apical inferior, and apical hypokinesis.  Grade 1 diastolic dysfunction was present.  He had normal RV function.  [name] was last seen in cardiology clinic in May of this year, at which time he noted intermittent fatigue was overall doing well.  Since his last visit, he has continued to do well.  He is a little bit bothered by seasonal allergies in the setting of dry leaves and hayfever.  He has chronic dyspnea on exertion which he thinks might be only slightly worse in the setting of seasonal allergies currently.  He has not required any as needed Lasix   in the past 6 months.  He has not had any significant chest pain in the past year.  He is lost about 36 pounds since last year in the setting of Ozempic and Jardiance.  He denies palpitations, PND, orthopnea, dizziness, syncope, edema, or early satiety.  Home Medications    Current Outpatient Medications  Medication Sig Dispense Refill   albuterol (PROVENTIL HFA;VENTOLIN HFA) 108 (90 Base) MCG/ACT inhaler Inhale 2 puffs into the lungs every 6 (six) hours as needed for wheezing or shortness of breath.      albuterol (PROVENTIL) (2.5 MG/3ML) 0.083% nebulizer solution Take 2.5 mg by nebulization every 6 (six) hours as needed for wheezing or shortness of breath.      aspirin EC 81 MG tablet Take 1 tablet (81 mg total) by mouth daily. 90 tablet 3   clopidogrel (PLAVIX) 75 MG tablet TAKE 1 TABLET(75 MG) BY MOUTH DAILY WITH BREAKFAST 30 tablet 2   cyclobenzaprine (FLEXERIL) 10 MG tablet Take 10 mg by mouth 2 (two) times daily as needed.     dexlansoprazole (DEXILANT) 60 MG capsule Take 1 capsule (60 mg total) by mouth daily. 30  capsule 11   DULoxetine (CYMBALTA) 60 MG capsule Take 1 capsule (60 mg total) by mouth daily. 90 capsule 1   ezetimibe (ZETIA) 10 MG tablet TAKE 1 TABLET(10 MG) BY MOUTH DAILY 90 tablet 3   furosemide (LASIX) 20 MG tablet TAKE 1 TABLET BY MOUTH DAILY AS NEEDED FOR WEIGHT GAIN OF 2 POUNDS OVERNIGHT OR 5 POUNDS PER 1 WEEK 90 tablet 0   gabapentin (NEURONTIN) 300 MG capsule Take 1 capsule (300 mg total) by mouth 3 (three) times daily. 90 capsule 1   gabapentin (NEURONTIN) 800 MG tablet Take 800 mg by mouth at bedtime.     insulin lispro (HUMALOG) 100 UNIT/ML KiwkPen Inject 7-8 Units into the skin 3 (three) times daily. Reported on 12/02/2015/ sliding scale 1 unit for every 7 units of carbs; and 1 unit for every 20 above 120     Insulin Pen Needle 32G X 4 MM MISC Inject 1 each into the skin as needed.     JARDIANCE 10 MG TABS tablet TAKE 1 TABLET(10 MG) BY MOUTH DAILY BEFORE BREAKFAST 30 tablet 0   LANTUS SOLOSTAR 100 UNIT/ML Solostar Pen Inject 31-33 Units into the skin daily at 10 pm.  0   levocetirizine (XYZAL) 5 MG tablet TAKE 1 TABLET(5 MG) BY MOUTH EVERY EVENING 30 tablet 3   levothyroxine (SYNTHROID) 50 MCG tablet TAKE 1 TABLET BY MOUTH DAILY BEFORE BREAKFAST AND 2 TABLETS ON SUNDAYS. RECHECK LEVEL IN 6 WEEKS 32 tablet 1   loratadine (CLARITIN) 10 MG tablet Take 10 mg by mouth daily.      metFORMIN (GLUCOPHAGE) 1000 MG tablet Take 1,000 mg by mouth 2 (two) times daily with a meal.     metoprolol succinate (TOPROL-XL) 100 MG 24 hr tablet TAKE 1 TABLET(100 MG) BY MOUTH DAILY WITH OR IMMEDIATELY FOLLOWING A MEAL 90 tablet 2   montelukast (SINGULAIR) 10 MG tablet Take 10 mg by mouth at bedtime.     montelukast (SINGULAIR) 10 MG tablet Take 1 tablet by mouth at bedtime.     Multiple Vitamins-Minerals (MENS MULTI VITAMIN & MINERAL PO) Take 1 tablet by mouth in the morning and at bedtime.     Na Sulfate-K Sulfate-Mg Sulf (SUPREP BOWEL PREP KIT) 17.5-3.13-1.6 GM/177ML SOLN Take 1 kit by mouth as  directed. 354 mL 0   naloxone (NARCAN) nasal spray 4   mg/0.1 mL For excess sedation from opioids 1 kit 2   nitroGLYCERIN (NITROSTAT) 0.3 MG SL tablet Place 1 tablet (0.3 mg total) under the tongue every 5 (five) minutes as needed for chest pain. Maximum 3 doses. 25 tablet 3   Oxycodone HCl 10 MG TABS Take 1 tablet (10 mg total) by mouth 4 (four) times daily. 120 tablet 0   rosuvastatin (CRESTOR) 5 MG tablet Take 5 mg by mouth daily.  0   Semaglutide,0.25 or 0.5MG/DOS, 2 MG/1.5ML SOPN Inject 1 mg into the skin once a week.     tamsulosin (FLOMAX) 0.4 MG CAPS capsule TAKE 2 CAPSULES BY MOUTH DAILY 60 capsule 2   Fluticasone-Umeclidin-Vilant (TRELEGY ELLIPTA) 100-62.5-25 MCG/INH AEPB Inhale 1 each into the lungs daily. In place of breo (Patient not taking: Reported on 07/17/2021) 60 each 2   [START ON 08/07/2021] Oxycodone HCl 10 MG TABS Take 1 tablet (10 mg total) by mouth in the morning, at noon, in the evening, and at bedtime. (Patient not taking: Reported on 07/17/2021) 120 tablet 0   No current facility-administered medications for this visit.     Review of Systems    Chronic dyspnea on exertion which might be slightly worse in the setting of seasonal allergies currently.  He denies chest pain, palpitations, PND, orthopnea, dizziness, syncope, edema, or early satiety.  All other systems reviewed and are otherwise negative except as noted above.  Physical Exam    VS:  BP 100/68 (BP Location: Left Arm, Patient Position: Sitting, Cuff Size: Normal)   Pulse 98   Ht 5' 7" (1.702 m)   Wt 218 lb 4 oz (99 kg)   SpO2 98%   BMI 34.18 kg/m  , BMI Body mass index is 34.18 kg/m.     GEN: Well nourished, well developed, in no acute distress. HEENT: normal. Neck: Supple, no JVD, carotid bruits, or masses. Cardiac: RRR, no murmurs, rubs, or gallops. No clubbing, cyanosis, edema.  Radials 2+/PT 1+ and equal bilaterally.  Respiratory:  Respirations regular and unlabored, clear to auscultation  bilaterally. GI: Obese, soft, nontender, nondistended, BS + x 4. MS: no deformity or atrophy. Skin: warm and dry, no rash. Neuro:  Strength and sensation are intact. Psych: Normal affect.  Accessory Clinical Findings    ECG personally reviewed by me today -regular sinus rhythm, 98, rightward axis, inferior and anterolateral infarcts, right bundle branch block- no acute changes.  Lab Results  Component Value Date   WBC 8.1 04/02/2021   HGB 11.7 (L) 04/02/2021   HCT 37.7 (L) 04/02/2021   MCV 84.5 04/02/2021   PLT 247 04/02/2021   Lab Results  Component Value Date   CREATININE 1.22 12/25/2020   BUN 25 (H) 12/25/2020   NA 138 12/25/2020   K 4.9 12/25/2020   CL 104 12/25/2020   CO2 26 12/25/2020   Lab Results  Component Value Date   ALT 20 09/17/2020   AST 17 09/17/2020   ALKPHOS 90 09/17/2020   BILITOT 0.3 09/17/2020   Lab Results  Component Value Date   CHOL 104 09/17/2020   HDL 41 09/17/2020   LDLCALC 44 09/17/2020   TRIG 97 09/17/2020   CHOLHDL 2.5 09/17/2020    Lab Results  Component Value Date   HGBA1C 7.9 01/29/2021    Assessment & Plan    1.  Coronary artery disease: Patient has been doing well without any significant chest pain over the past year.  He has chronic, relatively stable dyspnea on exertion.    He remains on aspirin, Plavix, beta-blocker, statin, and Zetia therapy.  2.  Chronic heart failure with reduced ejection fraction/ischemic cardiomyopathy: EF 35 to 40% in March of this year.  As above, he has chronic dyspnea on exertion which is relatively stable, though he has noted some change in this related to seasonal allergies currently.  His weight is down 36 pounds over the past year and he is euvolemic on examination.  Guideline directed medical therapy has been limited by soft blood pressures though he is tolerating beta-blocker and Jardiance.  He has not required a Lasix in the past 6 months.  Entresto previously discontinued secondary to hypotension.   For the same reason, he is a poor candidate for spironolactone.  3.  Essential hypertension: As noted above, blood pressure tends to run soft.  He remains on beta-blocker therapy.  4.  Hyperlipidemia: Tolerating rosuvastatin and Zetia.  LDL was 44 last December.  Will be due for follow-up labs soon and he requested these be drawn today.  5.  Type 2 diabetes mellitus: A1c 7.9 in April.  Followed closely by primary care and remains on Lantus, Humalog, Jardiance, Ozempic, and metformin.  6.  Obstructive sleep apnea: Compliant with CPAP.  7.  Iron deficiency anemia: H&H stable in June.  EGD colonoscopy over the summer unremarkable.  Pending capsule endoscopy.  8.  Disposition: Follow-up complete metabolic panel and lipids today.  Follow-up in clinic in 6 months or sooner if necessary.   Christopher Berge, NP 07/17/2021, 10:47 AM   

## 2021-07-18 ENCOUNTER — Telehealth: Payer: Self-pay

## 2021-07-18 LAB — COMPREHENSIVE METABOLIC PANEL
ALT: 25 IU/L (ref 0–44)
AST: 37 IU/L (ref 0–40)
Albumin/Globulin Ratio: 2.1 (ref 1.2–2.2)
Albumin: 4.2 g/dL (ref 3.8–4.8)
Alkaline Phosphatase: 99 IU/L (ref 44–121)
BUN/Creatinine Ratio: 17 (ref 10–24)
BUN: 21 mg/dL (ref 8–27)
Bilirubin Total: 0.3 mg/dL (ref 0.0–1.2)
CO2: 20 mmol/L (ref 20–29)
Calcium: 9.7 mg/dL (ref 8.6–10.2)
Chloride: 105 mmol/L (ref 96–106)
Creatinine, Ser: 1.26 mg/dL (ref 0.76–1.27)
Globulin, Total: 2 g/dL (ref 1.5–4.5)
Glucose: 122 mg/dL — ABNORMAL HIGH (ref 70–99)
Potassium: 5 mmol/L (ref 3.5–5.2)
Sodium: 141 mmol/L (ref 134–144)
Total Protein: 6.2 g/dL (ref 6.0–8.5)
eGFR: 64 mL/min/{1.73_m2} (ref >59)

## 2021-07-18 LAB — LIPID PANEL
Chol/HDL Ratio: 3.3 ratio (ref 0.0–5.0)
Cholesterol, Total: 92 mg/dL — ABNORMAL LOW (ref 100–199)
HDL: 28 mg/dL — ABNORMAL LOW (ref >39)
LDL Chol Calc (NIH): 39 mg/dL (ref 0–99)
Triglycerides: 145 mg/dL (ref 0–149)
VLDL Cholesterol Cal: 25 mg/dL (ref 5–40)

## 2021-07-18 LAB — LDL CHOLESTEROL, DIRECT: LDL Direct: 36 mg/dL (ref 0–99)

## 2021-07-18 NOTE — Telephone Encounter (Signed)
Janan Ridge, [place]  07/18/2021  1:07 PM EDT Back to Top    Attempted to contact patient with lab results.  No answer. No VM.  Phone Note Started   Liliane Shi, [place]  07/18/2021 12:17 PM EDT     Covering for [name], NP while he is on vacation. Glucose elevated.  Creatinine, K+, LFTs normal. LDL optimal.   PLAN:  -Continue current medications/treatment plan and follow up as scheduled.  Richardson Dopp, PA-C    07/18/2021 12:14 PM

## 2021-07-20 ENCOUNTER — Other Ambulatory Visit: Payer: Self-pay | Admitting: Family Medicine

## 2021-07-20 NOTE — Telephone Encounter (Signed)
last RF 06/20/21 #32 1 RF

## 2021-07-22 ENCOUNTER — Telehealth: Payer: Self-pay | Admitting: Family Medicine

## 2021-07-22 NOTE — Telephone Encounter (Signed)
Copied from [place] [id number]. Topic: Medicare AWV >> Jul 22, 2021 11:03 AM Cher Nakai R wrote: Reason for CRM:   LM re: AWVS will be by phone not in the office-srs

## 2021-07-24 ENCOUNTER — Ambulatory Visit (INDEPENDENT_AMBULATORY_CARE_PROVIDER_SITE_OTHER): Payer: Medicare Other

## 2021-07-24 DIAGNOSIS — Z0000 Encounter for general adult medical examination without abnormal findings: Secondary | ICD-10-CM

## 2021-07-24 NOTE — Patient Instructions (Signed)
[name] , Thank you for taking time to come for your Medicare Wellness Visit. I appreciate your ongoing commitment to your health goals. Please review the following plan we discussed and let me know if I can assist you in the future.   Screening recommendations/referrals: Colonoscopy: done 04/14/21. Repeat 04/2028 Recommended yearly ophthalmology/optometry visit for glaucoma screening and checkup Recommended yearly dental visit for hygiene and checkup  Vaccinations: Influenza vaccine: done 07/18/21 Pneumococcal vaccine: done 07/21/19 Tdap vaccine: done 04/04/13 Shingles vaccine: Shingrix discussed. Please contact your pharmacy for coverage information.   Covid-19:  done 12/22/19, 01/16/20, 08/05/20 & 07/18/21  Advanced directives: Please bring a copy of your health care power of attorney and living will to the office at your convenience.   Conditions/risks identified: Keep up the great work!  Next appointment: Follow up in one year for your annual wellness visit   Preventive Care 40-64 Years, Male Preventive care refers to lifestyle choices and visits with your health care provider that can promote health and wellness. What does preventive care include? A yearly physical exam. This is also called an annual well check. Dental exams once or twice a year. Routine eye exams. Ask your health care provider how often you should have your eyes checked. Personal lifestyle choices, including: Daily care of your teeth and gums. Regular physical activity. Eating a healthy diet. Avoiding tobacco and drug use. Limiting alcohol use. Practicing safe sex. Taking low-dose aspirin every day starting at age 2. What happens during an annual well check? The services and screenings done by your health care provider during your annual well check will depend on your age, overall health, lifestyle risk factors, and family history of disease. Counseling  Your health care provider may ask you questions  about your: Alcohol use. Tobacco use. Drug use. Emotional well-being. Home and relationship well-being. Sexual activity. Eating habits. Work and work Statistician. Screening  You may have the following tests or measurements: Height, weight, and BMI. Blood pressure. Lipid and cholesterol levels. These may be checked every 5 years, or more frequently if you are over 79 years old. Skin check. Lung cancer screening. You may have this screening every year starting at age 15 if you have a 30-pack-year history of smoking and currently smoke or have quit within the past 15 years. Fecal occult blood test (FOBT) of the stool. You may have this test every year starting at age 80. Flexible sigmoidoscopy or colonoscopy. You may have a sigmoidoscopy every 5 years or a colonoscopy every 10 years starting at age 60. Prostate cancer screening. Recommendations will vary depending on your family history and other risks. Hepatitis C blood test. Hepatitis B blood test. Sexually transmitted disease (STD) testing. Diabetes screening. This is done by checking your blood sugar (glucose) after you have not eaten for a while (fasting). You may have this done every 1-3 years. Discuss your test results, treatment options, and if necessary, the need for more tests with your health care provider. Vaccines  Your health care provider may recommend certain vaccines, such as: Influenza vaccine. This is recommended every year. Tetanus, diphtheria, and acellular pertussis (Tdap, Td) vaccine. You may need a Td booster every 10 years. Zoster vaccine. You may need this after age 44. Pneumococcal 13-valent conjugate (PCV13) vaccine. You may need this if you have certain conditions and have not been vaccinated. Pneumococcal polysaccharide (PPSV23) vaccine. You may need one or two doses if you smoke cigarettes or if you have certain conditions. Talk to your health care  provider about which screenings and vaccines you need and  how often you need them. This information is not intended to replace advice given to you by your health care provider. Make sure you discuss any questions you have with your health care provider. Document Released: 10/18/2015 Document Revised: 06/10/2016 Document Reviewed: 07/23/2015 Elsevier Interactive Patient Education  2017 [place] Prevention in the Home Falls can cause injuries. They can happen to people of all ages. There are many things you can do to make your home safe and to help prevent falls. What can I do on the outside of my home? Regularly fix the edges of walkways and driveways and fix any cracks. Remove anything that might make you trip as you walk through a door, such as a raised step or threshold. Trim any bushes or trees on the path to your home. Use bright outdoor lighting. Clear any walking paths of anything that might make someone trip, such as rocks or tools. Regularly check to see if handrails are loose or broken. Make sure that both sides of any steps have handrails. Any raised decks and porches should have guardrails on the edges. Have any leaves, snow, or ice cleared regularly. Use sand or salt on walking paths during winter. Clean up any spills in your garage right away. This includes oil or grease spills. What can I do in the bathroom? Use night lights. Install grab bars by the toilet and in the tub and shower. Do not use towel bars as grab bars. Use non-skid mats or decals in the tub or shower. If you need to sit down in the shower, use a plastic, non-slip stool. Keep the floor dry. Clean up any water that spills on the floor as soon as it happens. Remove soap buildup in the tub or shower regularly. Attach bath mats securely with double-sided non-slip rug tape. Do not have throw rugs and other things on the floor that can make you trip. What can I do in the bedroom? Use night lights. Make sure that you have a light by your bed that is easy to  reach. Do not use any sheets or blankets that are too big for your bed. They should not hang down onto the floor. Have a firm chair that has side arms. You can use this for support while you get dressed. Do not have throw rugs and other things on the floor that can make you trip. What can I do in the kitchen? Clean up any spills right away. Avoid walking on wet floors. Keep items that you use a lot in easy-to-reach places. If you need to reach something above you, use a strong step stool that has a grab bar. Keep electrical cords out of the way. Do not use floor polish or wax that makes floors slippery. If you must use wax, use non-skid floor wax. Do not have throw rugs and other things on the floor that can make you trip. What can I do with my stairs? Do not leave any items on the stairs. Make sure that there are handrails on both sides of the stairs and use them. Fix handrails that are broken or loose. Make sure that handrails are as long as the stairways. Check any carpeting to make sure that it is firmly attached to the stairs. Fix any carpet that is loose or worn. Avoid having throw rugs at the top or bottom of the stairs. If you do have throw rugs, attach them to the  floor with carpet tape. Make sure that you have a light switch at the top of the stairs and the bottom of the stairs. If you do not have them, ask someone to add them for you. What else can I do to help prevent falls? Wear shoes that: Do not have high heels. Have rubber bottoms. Are comfortable and fit you well. Are closed at the toe. Do not wear sandals. If you use a stepladder: Make sure that it is fully opened. Do not climb a closed stepladder. Make sure that both sides of the stepladder are locked into place. Ask someone to hold it for you, if possible. Clearly mark and make sure that you can see: Any grab bars or handrails. First and last steps. Where the edge of each step is. Use tools that help you move  around (mobility aids) if they are needed. These include: Canes. Walkers. Scooters. Crutches. Turn on the lights when you go into a dark area. Replace any light bulbs as soon as they burn out. Set up your furniture so you have a clear path. Avoid moving your furniture around. If any of your floors are uneven, fix them. If there are any pets around you, be aware of where they are. Review your medicines with your doctor. Some medicines can make you feel dizzy. This can increase your chance of falling. Ask your doctor what other things that you can do to help prevent falls. This information is not intended to replace advice given to you by your health care provider. Make sure you discuss any questions you have with your health care provider. Document Released: 07/18/2009 Document Revised: 02/27/2016 Document Reviewed: 10/26/2014 Elsevier Interactive Patient Education  2017 [employer].

## 2021-07-24 NOTE — Progress Notes (Signed)
Subjective:   [name] is a 60 y.o. male who presents for Medicare Annual/Subsequent preventive examination.  Virtual Visit via Telephone Note  I connected with  [name] on 07/24/21 at 11:20 AM EDT by telephone and verified that I am speaking with the correct person using two identifiers.  Location: Patient: home Provider: [place] Persons participating in the virtual visit: [place]   I discussed the limitations, risks, security and privacy concerns of performing an evaluation and management service by telephone and the availability of in person appointments. The patient expressed understanding and agreed to proceed.  Interactive audio and video telecommunications were attempted between this nurse and patient, however failed, due to patient having technical difficulties OR patient did not have access to video capability.  We continued and completed visit with audio only.  Some vital signs may be absent or patient reported.   Clemetine Marker, LPN   Review of Systems     Cardiac Risk Factors include: advanced age (>17men, >43 women);diabetes mellitus;hypertension;dyslipidemia;male gender     Objective:    Today's Vitals   07/24/21 1114  PainSc: 4    There is no height or weight on file to calculate BMI.  Advanced Directives 07/24/2021 04/14/2021 01/07/2021 09/11/2020 07/23/2020 07/18/2020 02/20/2020  Does Patient Have a Medical Advance Directive? Yes Yes Yes Yes Yes No Yes  Type of Advance Directive - [place];Living will [place];Living will [place];Living will [place];Living will - [place];Living will  Does patient want to make changes to medical advance directive? No - Patient declined No - Patient declined - - - - -  Copy of [place] in Chart? - Yes - validated most recent copy scanned in chart (See row information) - - No -  copy requested - -  Would patient like information on creating a medical advance directive? - - - - - No - Patient declined -    Current Medications (verified) Outpatient Encounter Medications as of 07/24/2021  Medication Sig   albuterol (PROVENTIL HFA;VENTOLIN HFA) 108 (90 Base) MCG/ACT inhaler Inhale 2 puffs into the lungs every 6 (six) hours as needed for wheezing or shortness of breath.    albuterol (PROVENTIL) (2.5 MG/3ML) 0.083% nebulizer solution Take 2.5 mg by nebulization every 6 (six) hours as needed for wheezing or shortness of breath.    aspirin EC 81 MG tablet Take 1 tablet (81 mg total) by mouth daily.   clopidogrel (PLAVIX) 75 MG tablet TAKE 1 TABLET(75 MG) BY MOUTH DAILY WITH BREAKFAST   cyclobenzaprine (FLEXERIL) 10 MG tablet Take 10 mg by mouth 2 (two) times daily as needed.   dexlansoprazole (DEXILANT) 60 MG capsule Take 1 capsule (60 mg total) by mouth daily.   DULoxetine (CYMBALTA) 60 MG capsule Take 1 capsule (60 mg total) by mouth daily.   ezetimibe (ZETIA) 10 MG tablet TAKE 1 TABLET(10 MG) BY MOUTH DAILY   Fluticasone-Umeclidin-Vilant (TRELEGY ELLIPTA) 100-62.5-25 MCG/INH AEPB Inhale 1 each into the lungs daily. In place of breo   furosemide (LASIX) 20 MG tablet TAKE 1 TABLET BY MOUTH DAILY AS NEEDED FOR WEIGHT GAIN OF 2 POUNDS OVERNIGHT OR 5 POUNDS PER 1 WEEK   gabapentin (NEURONTIN) 300 MG capsule Take 1 capsule (300 mg total) by mouth 3 (three) times daily.   gabapentin (NEURONTIN) 800 MG tablet Take 800 mg by mouth at bedtime.   insulin lispro (HUMALOG) 100 UNIT/ML KiwkPen Inject 7-8  Units into the skin 3 (three) times daily. Reported on 12/02/2015/ sliding scale 1 unit for every 7 units of carbs; and 1 unit for every 20 above 120   Insulin Pen Needle 32G X 4 MM MISC Inject 1 each into the skin as needed.   JARDIANCE 10 MG TABS tablet TAKE 1 TABLET(10 MG) BY MOUTH DAILY BEFORE BREAKFAST   LANTUS SOLOSTAR 100 UNIT/ML Solostar Pen Inject 31-33 Units into the skin daily  at 10 pm.   levocetirizine (XYZAL) 5 MG tablet TAKE 1 TABLET(5 MG) BY MOUTH EVERY EVENING   levothyroxine (SYNTHROID) 50 MCG tablet TAKE 1 TABLET BY MOUTH DAILY BEFORE BREAKFAST AND 2 TABLETS ON SUNDAYS. RECHECK LEVEL IN 6 WEEKS   loratadine (CLARITIN) 10 MG tablet Take 10 mg by mouth daily.    metFORMIN (GLUCOPHAGE) 1000 MG tablet Take 1,000 mg by mouth 2 (two) times daily with a meal.   metoprolol succinate (TOPROL-XL) 100 MG 24 hr tablet TAKE 1 TABLET(100 MG) BY MOUTH DAILY WITH OR IMMEDIATELY FOLLOWING A MEAL   montelukast (SINGULAIR) 10 MG tablet Take 1 tablet by mouth at bedtime.   Multiple Vitamins-Minerals (MENS MULTI VITAMIN & MINERAL PO) Take 1 tablet by mouth in the morning and at bedtime.   naloxone (NARCAN) nasal spray 4 mg/0.1 mL For excess sedation from opioids   nitroGLYCERIN (NITROSTAT) 0.3 MG SL tablet Place 1 tablet (0.3 mg total) under the tongue every 5 (five) minutes as needed for chest pain. Maximum 3 doses.   Oxycodone HCl 10 MG TABS Take 1 tablet (10 mg total) by mouth 4 (four) times daily.   OZEMPIC, 1 MG/DOSE, 4 MG/3ML SOPN Inject 1 mg into the skin once a week.   rosuvastatin (CRESTOR) 5 MG tablet Take 5 mg by mouth daily.   tamsulosin (FLOMAX) 0.4 MG CAPS capsule TAKE 2 CAPSULES BY MOUTH DAILY   [START ON 08/07/2021] Oxycodone HCl 10 MG TABS Take 1 tablet (10 mg total) by mouth in the morning, at noon, in the evening, and at bedtime.   [DISCONTINUED] montelukast (SINGULAIR) 10 MG tablet Take 10 mg by mouth at bedtime.   [DISCONTINUED] Na Sulfate-K Sulfate-Mg Sulf (SUPREP BOWEL PREP KIT) 17.5-3.13-1.6 GM/177ML SOLN Take 1 kit by mouth as directed.   [DISCONTINUED] Semaglutide,0.25 or 0.5MG /DOS, 2 MG/1.5ML SOPN Inject 1 mg into the skin once a week.   No facility-administered encounter medications on file as of 07/24/2021.    Allergies (verified) Azathioprine and Novolog [insulin aspart]   History: Past Medical History:  Diagnosis Date   Allergy    dust,  seasonal (worse in the fall).   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 Followed by Pain Specialist in [place], back and neck   Asthma    BRONCHITIS   Cataract    First Dx in 2012   Chronic combined systolic and diastolic congestive heart failure ([place])    a. 03/2018 Echo: EF 30-35%; b. 07/2018 Echo: EF 35-40%; c. 09/2019 TEE: EF 40-45%; d. 12/2020 Echo: EF 35-40%, sev apical ant, apical lat, apical inf, apical HK. GrI DD, nl RV fxn.   Coronary artery disease    a. Prior Ant MI->s/p multiple stents placed in the LAD and right coronary artery ([place]); b. 2016 Cath: reportedly nonobs dzs;  c. 04/2018 Cath/PCI: LM nl, LAD 20p, patent mid stent, LCX 76m(3.25x15 Sierra DES), OM1 nl, OM2 50, OM3 40 w/ patent stent, RCA 40p, 15m, 40d w/ patent stent in RPDA, RPAV 60, EF 25-35%. 2+MR; d.  02/2019 MV: Apical scar, no isch, EF 30-44%.   Deaf, left    Diabetes mellitus without complication ([place])    TYPE 2   Diabetic peripheral neuropathy ([place])    feet and hands   FUO (fever of unknown origin) 08/03/2018   GERD (gastroesophageal reflux disease)    Headache    muscle tension   Hyperlipidemia    Hypertension    CONTROLLED ON MEDS   Hyperthyroidism    Insomnia    Ischemic cardiomyopathy    a. 03/2018 Echo: EF 30-35%, ant, antlat, apical AK, Gr1 DD; b. 07/2018 Echo: EF 35-40%, anteroseptal, apical, and ant HK. Gr1 DD; c. 09/2019 TEE: EF 40-45%; d. 12/2020 Echo: EF 35-40%, sev apical ant, apical lat, apical inf, apical HK. GrI DD, nl RV fxn.   Knee pain, acute 05/06/2020   Left arm weakness 10/04/2019   Left leg weakness 12/01/2019   Lyme disease    Chronic   Myasthenia gravis ([place])    (03/18/21 - no current treatment - "better than it has ever been" per pt)   Myocardial infarction ([place]) 2010   Palpitations    a. 10/2020 Zio: RSR, 88 avg. 3 brief SVT episodes (max 5 beats @ 128). Rare PACs/PVCs. Triggered events did not correlate w/ significant arrthymia - some w/ sinus tach.   Seasonal allergies     Sepsis ([place])    a.07/2018 - unknown source. TEE neg for veg 09/2019.   Sleep apnea    CPAP   Wears hearing aid in both ears    Past Surgical History:  Procedure Laterality Date   BILATERAL CARPAL TUNNEL RELEASE Bilateral L in 2012 and R in 2013   [place], most recent in  March 2016.   COLONOSCOPY WITH PROPOFOL N/A 01/10/2016   Procedure: COLONOSCOPY WITH PROPOFOL;  Surgeon: [name], MD;  Location: [facility];  Service: Endoscopy;  Laterality: N/A;   COLONOSCOPY WITH PROPOFOL N/A 04/14/2021   Procedure: COLONOSCOPY WITH PROPOFOL;  Surgeon: [name], MD;  Location: [facility];  Service: Endoscopy;  Laterality: N/A;  Diabetic - insulin and oral meds   CORONARY ANGIOPLASTY     CORONARY STENT INTERVENTION N/A 04/25/2018   Procedure: CORONARY STENT INTERVENTION;  Surgeon: [name], MD;  Location: [facility];  Service: Cardiovascular;  Laterality: N/A;   ESOPHAGOGASTRODUODENOSCOPY (EGD) WITH PROPOFOL N/A 01/10/2016   Procedure: ESOPHAGOGASTRODUODENOSCOPY (EGD) WITH PROPOFOL;  Surgeon: [name], MD;  Location: [facility];  Service: Endoscopy;  Laterality: N/A;   ESOPHAGOGASTRODUODENOSCOPY (EGD) WITH PROPOFOL N/A 04/14/2021   Procedure: ESOPHAGOGASTRODUODENOSCOPY (EGD) WITH BIOPSY;  Surgeon: [name], MD;  Location: [facility];  Service: Endoscopy;  Laterality: N/A;   EYE SURGERY Bilateral 2012   cataract/bilateral vitrectomies   LEFT HEART CATH AND CORONARY ANGIOGRAPHY Left 04/25/2018   Procedure: LEFT HEART CATH AND CORONARY ANGIOGRAPHY;  Surgeon: [name], MD;  Location: [facility];  Service: Cardiovascular;  Laterality: Left;   TEE WITHOUT CARDIOVERSION N/A 09/05/2018   Procedure: TRANSESOPHAGEAL ECHOCARDIOGRAM (TEE);  Surgeon: [name], MD;  Location: [facility];  Service: Cardiovascular;  Laterality: N/A;   TONSILLECTOMY AND ADENOIDECTOMY     As a child   TUNNELED VENOUS CATHETER  PLACEMENT     removed   Family History  Problem Relation Age of Onset   Diabetes Mother    Heart disease Mother    Cancer Father        Prostate CA, Anal  cancer    Dementia Father    Diabetes Brother    Healthy Brother    Healthy Brother    Social History   Socioeconomic History   Marital status: Married    Spouse name: Neoma Laming   Number of children: 0   Years of education: Not on file   Highest education level: Master's degree (e.g., MA, MS, MEng, MEd, MSW, MBA)  Occupational History   Occupation: disabled    Comment: multiple medical problems - first approved for uncontrolled DM, but now has heart disease and Myasthenia Gravis   Tobacco Use   Smoking status: Never   Smokeless tobacco: Never   Tobacco comments:    smoking cessation materials not required  Vaping Use   Vaping Use: Never used  Substance and Sexual Activity   Alcohol use: Not Currently    Alcohol/week: 0.0 standard drinks   Drug use: No   Sexual activity: Not Currently    Partners: Female  Other Topics Concern   Not on file  Social History Narrative   Not on file   Social Determinants of Health   Financial Resource Strain: Low Risk    Difficulty of Paying Living Expenses: Not very hard  Food Insecurity: No Food Insecurity   Worried About Charity fundraiser in the Last Year: Never true   Ran Out of Food in the Last Year: Never true  Transportation Needs: No Transportation Needs   Lack of Transportation (Medical): No   Lack of Transportation (Non-Medical): No  Physical Activity: Insufficiently Active   Days of Exercise per Week: 7 days   Minutes of Exercise per Session: 20 min  Stress: Stress Concern Present   Feeling of Stress : To some extent  Social Connections: Socially Integrated   Frequency of Communication with Friends and Family: More than three times a week   Frequency of Social Gatherings with Friends and Family: Three times a week   Attends Religious Services: More than 4 times per  year   Active Member of Clubs or Organizations: Yes   Attends Music therapist: More than 4 times per year   Marital Status: Married    Tobacco Counseling Counseling given: Not Answered Tobacco comments: smoking cessation materials not required   Clinical Intake:  Pre-visit preparation completed: Yes  Pain : 0-10 Pain Score: 4  Pain Type: Chronic pain Pain Location: Back Pain Orientation: Left, Lower Pain Descriptors / Indicators: Aching, Sore Pain Onset: More than a month ago Pain Frequency: Constant     Nutritional Risks: None Diabetes: Yes CBG done?: No Did pt. bring in CBG monitor from home?: No  How often do you need to have someone help you when you read instructions, pamphlets, or other written materials from your doctor or pharmacy?: 1 - Never  Nutrition Risk Assessment:  Has the patient had any N/V/D within the last 2 months?  No  Does the patient have any non-healing wounds?  No  Has the patient had any unintentional weight loss or weight gain?  No   Diabetes:  Is the patient diabetic?  Yes  If diabetic, was a CBG obtained today?  No  Did the patient bring in their glucometer from home?  No  How often do you monitor your CBG's? 4 times per day.   Financial Strains and Diabetes Management:  Are you having any financial strains with the device, your supplies or your medication? No .  Does the patient want to be seen by Chronic Care  Management for management of their diabetes?  No  Would the patient like to be referred to a Nutritionist or for Diabetic Management?  No   Diabetic Exams:  Diabetic Eye Exam: Completed per patient, appt scheduled in November.   Diabetic Foot Exam: Completed 11/11/20.   Interpreter Needed?: No  Information entered by :: Clemetine Marker LPN   Activities of Daily Living In your present state of health, do you have any difficulty performing the following activities: 07/24/2021 06/23/2021  Hearing? Tempie Donning  Comment  wears hearing aids -  Vision? N N  Difficulty concentrating or making decisions? N N  Walking or climbing stairs? N N  Dressing or bathing? N N  Doing errands, shopping? N N  Preparing Food and eating ? N -  Using the Toilet? N -  In the past six months, have you accidently leaked urine? N -  Do you have problems with loss of bowel control? N -  Managing your Medications? N -  Managing your Finances? N -  Housekeeping or managing your Housekeeping? N -  Some recent data might be hidden    Patient Care Team: [name], MD as PCP - General (Family Medicine) [name], MD as PCP - Cardiology (Cardiology) Erby Pian, MD as Consulting Physician (Pulmonary Disease) Lonia Farber, MD as Consulting Physician (Endocrinology) [name], MD as Consulting Physician (Gastroenterology) Germaine Pomfret, [facility] (Pharmacist) Molli Barrows, MD as Consulting Physician (Pain Medicine)  Indicate any recent Medical Services you may have received from other than [facility] providers in the past year (date may be approximate).     Assessment:   This is a routine wellness examination for [place].  Hearing/Vision screen Hearing Screening - Comments:: Pt wears hearing aids maintained by  ENT Vision Screening - Comments:: Annual vision screenings done at retina specialist in [place] issues and exercise activities discussed: Current Exercise Habits: Home exercise routine, Type of exercise: walking, Time (Minutes): 20, Frequency (Times/Week): 7, Weekly Exercise (Minutes/Week): 140, Intensity: Mild, Exercise limited by: orthopedic condition(s)   Goals Addressed             This Visit's Progress    Track and Manage Fluids and Swelling-Heart Failure   On track    Timeframe:  Long-Range Goal Priority:  High Start Date:   12/05/2020                          Expected End Date: 09/09/2021                      Follow Up Date 06/11/2021    - call office if I  gain more than 2 pounds in one day or 5 pounds in one week - keep legs up while sitting - use salt in moderation    Why is this important?   It is important to check your weight daily and watch how much salt and liquids you have.  It will help you to manage your heart failure.    Notes:        Depression Screen PHQ 2/9 Scores 07/24/2021 06/23/2021 04/15/2021 04/02/2021 09/16/2020 09/11/2020 07/23/2020  PHQ - 2 Score 1 0 0 1 2 0 0  PHQ- 9 Score 3 - - 4 8 - -  Exception Documentation - - - - - - -    Fall Risk Fall Risk  07/24/2021 06/23/2021 04/15/2021 04/02/2021 01/07/2021  Falls in the past year?  $'1 1 1 'b$ 0 1  Comment - - - - -  Number falls in past yr: 0 0 0 0 0  Comment - - - - -  Injury with Fall? $RemoveBe'1 1 1 'ydlmJDrCj$ 0 1  Comment - - injured head - While sick throwing up and hit face on shower  Risk Factor Category  - - - - -  Risk for fall due to : History of fall(s) No Fall Risks - - Impaired balance/gait  Risk for fall due to: Comment - - - - -  Follow up Falls prevention discussed Falls prevention discussed - - Education provided;Falls prevention discussed    FALL RISK PREVENTION PERTAINING TO THE HOME:  Any stairs in or around the home? No  If so, are there any without handrails? No  Home free of loose throw rugs in walkways, pet beds, electrical cords, etc? Yes  Adequate lighting in your home to reduce risk of falls? Yes   ASSISTIVE DEVICES UTILIZED TO PREVENT FALLS:  Life alert? No  Use of a cane, walker or w/c? Yes  Grab bars in the bathroom? Yes  Shower chair or bench in shower? Yes  Elevated toilet seat or a handicapped toilet? Yes   TIMED UP AND GO:  Was the test performed? No . Telephonic visit  Cognitive Function: Normal cognitive status assessed by direct observation by this Nurse Health Advisor. No abnormalities found.       6CIT Screen 07/23/2020 02/25/2018  What Year? 0 points 0 points  What month? 0 points 0 points  What time? 0 points 0 points  Count back from  20 0 points 0 points  Months in reverse 0 points 0 points  Repeat phrase 0 points 0 points  Total Score 0 0    Immunizations Immunization History  Administered Date(s) Administered   Influenza Inj Mdck Quad Pf 06/14/2019   Influenza,inj,Quad PF,6+ Mos 06/16/2017, 06/10/2018, 07/23/2020   Influenza-Unspecified 07/18/2021   PFIZER(Purple Top)SARS-COV-2 Vaccination 12/22/2019, 01/16/2020, 08/05/2020   Pfizer Covid-19 Vaccine Bivalent Booster 87yrs & up 07/18/2021   Pneumococcal Conjugate-13 07/21/2019   Pneumococcal Polysaccharide-23 01/25/2018   Td 04/04/2013    TDAP status: Up to date  Flu Vaccine status: Up to date  Pneumococcal vaccine status: Up to date  Covid-19 vaccine status: Completed vaccines  Qualifies for Shingles Vaccine? Yes   Zostavax completed No   Shingrix Completed?: No.    Education has been provided regarding the importance of this vaccine. Patient has been advised to call insurance company to determine out of pocket expense if they have not yet received this vaccine. Advised may also receive vaccine at local pharmacy or Health Dept. Verbalized acceptance and understanding.  Screening Tests Health Maintenance  Topic Date Due   Zoster Vaccines- Shingrix (1 of 2) Never done   URINE MICROALBUMIN  05/04/2020   OPHTHALMOLOGY EXAM  08/01/2020   COVID-19 Vaccine (4 - Booster for Pfizer series) 09/30/2020   HEMOGLOBIN A1C  07/31/2021   FOOT EXAM  11/11/2021   TETANUS/TDAP  04/05/2023   Pneumococcal Vaccine 70-90 Years old (3 - PPSV23 if available, else PCV20) 06/28/2024   COLONOSCOPY (Pts 45-44yrs Insurance coverage will need to be confirmed)  04/14/2026   INFLUENZA VACCINE  Completed   Hepatitis C Screening  Completed   HIV Screening  Completed   HPV VACCINES  Aged Out    Health Maintenance  Health Maintenance Due  Topic Date Due   Zoster Vaccines- Shingrix (1 of 2) Never done   URINE  MICROALBUMIN  05/04/2020   OPHTHALMOLOGY EXAM  08/01/2020    COVID-19 Vaccine (4 - Booster for Pfizer series) 09/30/2020    Colorectal cancer screening: Type of screening: Colonoscopy. Completed 04/14/21. Repeat every 7 years  Lung Cancer Screening: (Low Dose CT Chest recommended if Age 32-80 years, 30 pack-year currently smoking OR have quit w/in 15years.) does not qualify.   Additional Screening:  Hepatitis C Screening: does qualify; Completed 01/26/18  Vision Screening: Recommended annual ophthalmology exams for early detection of glaucoma and other disorders of the eye. Is the patient up to date with their annual eye exam?  Yes  Who is the provider or what is the name of the office in which the patient attends annual eye exams? Eye provider in [place].   Dental Screening: Recommended annual dental exams for proper oral hygiene  Community Resource Referral / Chronic Care Management: CRR required this visit?  No   CCM required this visit?  No      Plan:     I have personally reviewed and noted the following in the patient's chart:   Medical and social history Use of alcohol, tobacco or illicit drugs  Current medications and supplements including opioid prescriptions. Patient is currently taking opioid prescriptions. Information provided to patient regarding non-opioid alternatives. Patient advised to discuss non-opioid treatment plan with their provider. Functional ability and status Nutritional status Physical activity Advanced directives List of other physicians Hospitalizations, surgeries, and ER visits in previous 12 months Vitals Screenings to include cognitive, depression, and falls Referrals and appointments  In addition, I have reviewed and discussed with patient certain preventive protocols, quality metrics, and best practice recommendations. A written personalized care plan for preventive services as well as general preventive health recommendations were provided to patient.     Clemetine Marker, LPN   03/47/4259   Nurse  Notes: pt states he has been taking cymbalta twice daily per the last conversation with Dr. Ancil Boozer to help with anxiety/depression. Pt states stress with 67 year old father in [place] with dementia. Pt also c/o episodes of dizziness at night not related to low blood sugar of blood pressure. Pt plans to discuss at next visit on 08/01/21.

## 2021-07-26 ENCOUNTER — Other Ambulatory Visit: Payer: Self-pay | Admitting: Cardiovascular Disease

## 2021-07-28 ENCOUNTER — Other Ambulatory Visit: Payer: Self-pay | Admitting: Family Medicine

## 2021-07-29 ENCOUNTER — Telehealth: Payer: Self-pay

## 2021-07-29 NOTE — Progress Notes (Signed)
Chronic Care Management Pharmacy Assistant   Name: [name]  MRN: [id number] DOB: 1961-06-20  Reason for Encounter: Medication Review/Patient assistance renewal for Humalog Lantus, Ozempic, Trelegy,and Dexilant.   Recent office visits:  07/24/2021 Clemetine Marker (LPN) Medicare Wellness completed,  Recent consult visits:  07/17/2021 [name] (Cardiology) No medication Changes noted, Follow up 6 months 07/08/2021 Dr. [name] (Pain Medicine)  start Oxycodone 10 mg 4 times daily, return in 2 weeks  Hospital visits:  None in previous 6 months  Medications: Outpatient Encounter Medications as of 07/29/2021  Medication Sig Note   albuterol (PROVENTIL HFA;VENTOLIN HFA) 108 (90 Base) MCG/ACT inhaler Inhale 2 puffs into the lungs every 6 (six) hours as needed for wheezing or shortness of breath.     albuterol (PROVENTIL) (2.5 MG/3ML) 0.083% nebulizer solution Take 2.5 mg by nebulization every 6 (six) hours as needed for wheezing or shortness of breath.     aspirin EC 81 MG tablet Take 1 tablet (81 mg total) by mouth daily.    clopidogrel (PLAVIX) 75 MG tablet TAKE 1 TABLET(75 MG) BY MOUTH DAILY WITH BREAKFAST    cyclobenzaprine (FLEXERIL) 10 MG tablet Take 10 mg by mouth 2 (two) times daily as needed.    dexlansoprazole (DEXILANT) 60 MG capsule Take 1 capsule (60 mg total) by mouth daily.    DULoxetine (CYMBALTA) 60 MG capsule Take 1 capsule (60 mg total) by mouth daily. 07/24/2021: Pt taking once in AM and PM   ezetimibe (ZETIA) 10 MG tablet TAKE 1 TABLET(10 MG) BY MOUTH DAILY    Fluticasone-Umeclidin-Vilant (TRELEGY ELLIPTA) 100-62.5-25 MCG/INH AEPB Inhale 1 each into the lungs daily. In place of breo    furosemide (LASIX) 20 MG tablet TAKE 1 TABLET BY MOUTH DAILY AS NEEDED FOR WEIGHT GAIN OF 2 POUNDS OVERNIGHT OR 5 POUNDS PER 1 WEEK    gabapentin (NEURONTIN) 300 MG capsule Take 1 capsule (300 mg total) by mouth 3 (three) times daily.    gabapentin (NEURONTIN) 800 MG tablet  Take 800 mg by mouth at bedtime.    insulin lispro (HUMALOG) 100 UNIT/ML KiwkPen Inject 7-8 Units into the skin 3 (three) times daily. Reported on 12/02/2015/ sliding scale 1 unit for every 7 units of carbs; and 1 unit for every 20 above 120    Insulin Pen Needle 32G X 4 MM MISC Inject 1 each into the skin as needed.    JARDIANCE 10 MG TABS tablet TAKE 1 TABLET(10 MG) BY MOUTH DAILY BEFORE BREAKFAST    LANTUS SOLOSTAR 100 UNIT/ML Solostar Pen Inject 31-33 Units into the skin daily at 10 pm. 07/24/2021: Pt taking 26-29 units daily at bedtime    levocetirizine (XYZAL) 5 MG tablet TAKE 1 TABLET(5 MG) BY MOUTH EVERY EVENING    levothyroxine (SYNTHROID) 50 MCG tablet TAKE 1 TABLET BY MOUTH DAILY BEFORE BREAKFAST AND 2 TABLETS ON SUNDAY. RECHECK LEVEL IN 6 WEEKS    loratadine (CLARITIN) 10 MG tablet Take 10 mg by mouth daily.     metFORMIN (GLUCOPHAGE) 1000 MG tablet Take 1,000 mg by mouth 2 (two) times daily with a meal.    metoprolol succinate (TOPROL-XL) 100 MG 24 hr tablet TAKE 1 TABLET(100 MG) BY MOUTH DAILY WITH OR IMMEDIATELY FOLLOWING A MEAL    montelukast (SINGULAIR) 10 MG tablet Take 1 tablet by mouth at bedtime.    Multiple Vitamins-Minerals (MENS MULTI VITAMIN & MINERAL PO) Take 1 tablet by mouth in the morning and at bedtime.    naloxone (NARCAN) nasal spray  4 mg/0.1 mL For excess sedation from opioids    nitroGLYCERIN (NITROSTAT) 0.3 MG SL tablet Place 1 tablet (0.3 mg total) under the tongue every 5 (five) minutes as needed for chest pain. Maximum 3 doses.    Oxycodone HCl 10 MG TABS Take 1 tablet (10 mg total) by mouth 4 (four) times daily.    [START ON 08/07/2021] Oxycodone HCl 10 MG TABS Take 1 tablet (10 mg total) by mouth in the morning, at noon, in the evening, and at bedtime.    OZEMPIC, 1 MG/DOSE, 4 MG/3ML SOPN Inject 1 mg into the skin once a week.    rosuvastatin (CRESTOR) 5 MG tablet Take 5 mg by mouth daily.    tamsulosin (FLOMAX) 0.4 MG CAPS capsule TAKE 2 CAPSULES BY MOUTH  DAILY    No facility-administered encounter medications on file as of 07/29/2021.    Care Gaps: Zoster Vaccines Ophthalmology Exam (last completed 08/02/2019) Star Rating Drug: Jardiance 10 mg last filled on 06/15/2021 for a 30-Day supply with [facility] Semaglutide 1 mg last filled on 07/20/2021 for a 28-Day supply with [facility] Rosuvastatin 5 mg last filled on 07/15/2021 for a 90-Day supply with [facility] Metformin 1000 mg last filled on 07/15/2021 for a 90-Day supply with [facility] Medication Fill Gaps: None  Unsuccessful telephone outreach attempt made multiple times. I left voice messages for patient providing contact information and requesting a return call to  ask patient if he received patient assistance this year or if he would like to try to receive patient assistance for year 2023.  [place] Pharmacist Assistant [phone]

## 2021-07-31 ENCOUNTER — Ambulatory Visit: Payer: Medicare Other | Admitting: Podiatry

## 2021-07-31 ENCOUNTER — Other Ambulatory Visit: Payer: Self-pay

## 2021-07-31 ENCOUNTER — Encounter: Payer: Self-pay | Admitting: Podiatry

## 2021-07-31 DIAGNOSIS — M79675 Pain in left toe(s): Secondary | ICD-10-CM | POA: Diagnosis not present

## 2021-07-31 DIAGNOSIS — M79674 Pain in right toe(s): Secondary | ICD-10-CM | POA: Diagnosis not present

## 2021-07-31 DIAGNOSIS — B351 Tinea unguium: Secondary | ICD-10-CM | POA: Diagnosis not present

## 2021-07-31 DIAGNOSIS — E1142 Type 2 diabetes mellitus with diabetic polyneuropathy: Secondary | ICD-10-CM | POA: Diagnosis not present

## 2021-07-31 NOTE — Progress Notes (Signed)
Name: [name]   MRN: [id number]    DOB: 11-02-1960   Date:08/01/2021       Progress Note  Subjective  Chief Complaint  Follow up   (Last A1C was 8.3 06/04/2021 at [facility]/abstracted)  HPI  Asthma : he is having sob and is seeing Dr. Raul Del, using Trelegy now . Denies significant cough or wheezing   DMII:  he is under the care of Dr. Honor Junes , A1C was 8.4 % was down to 7.9 % and last one was 8.3 %  He states he has been checking glucose and it has been well controlled lately, it spikes after steroid injections.  He has associated HTN, dyslipidemia, neuropathy.  He is on statin therapy, ARB and also gabapentin. Taking insulin,  Metformin , SGL2 agonist and also GLP-1 agonist    Hypothyroidism: he is currently on 50 mcg of levothyroxine but two on Sundays , he has mild constipation but likely secondary to oxydocone - stable on stool softener, chronic dry skin this time of the year. He has stable dysphagia - going on for 30 years, and had EGD recently Normal TSH in June 22   Low back pain/ inflammatory spondylopathy/Cervical radiculitis : under the care of Dr. [name], he gets epidural injections and takes oxycodone.  Unchanged   Malnutrition: he has lost 30 lbs since last year without changing his diet or trying to lose weight. He also has anemia / iron deficiency of unknown cause , we will refer him to hematologist / oncologist   Iron deficiency anemia plus weight loss: seeing GI and has gastritis and duodenitis but taking Dexilant, he is going to have capsule endoscopy.   Myasthenia Gravis: seeing Dr. Westley Foots, he is off Mestinon, currently not on medications and symptoms are not any worse. He still has left eye ptosis, fatigue has been worse but likely unrelated to the MG at this time   Atherosclerosis of aorta/ Angina and CHF: he states sleeps with the head of the bed elevated because of his wife, not sure if he has orthopnea, he has SOB with activity that is likely multifactorial,   denies lower extremity edema. He has noticed increase in chest pain when stressed that resolves with NTG. BP has been low , taking metoprolol but now also on SGL-2 agonist. Advised to try taking half pill of metoprolol twice daily and get up slowly to avoid syncopal episodes. He has noticed orthostatic hypotension.   MDD:  he has a history of recurrent depression, and worse over the past year since dad diagnosed with dementia, giving his state to his current wife, also stress at home, but coping  better on Duloxetine but would like to go up on the dose.   Patient Active Problem List   Diagnosis Date Noted   Iron deficiency anemia due to chronic blood loss    Pain due to onychomycosis of toenails of both feet 11/11/2020   Lung involvement associated with another disorder ([place]) 05/17/2020   Left leg weakness 12/01/2019   Left arm weakness 10/04/2019   Lower urinary tract symptoms 09/15/2019   Incomplete bladder emptying 09/15/2019   Carpal tunnel syndrome on both sides 07/12/2019   Weakness generalized 06/21/2019   Idiopathic peripheral neuropathy 04/21/2019   Sensory ataxia 03/09/2019   Colitis 07/08/2018   OSA on CPAP 07/08/2018   Congestive heart failure (CHF) ([place]) 05/05/2018   Unstable angina ([place]) 04/26/2018   Ischemic cardiomyopathy 04/26/2018   Chronic combined systolic and diastolic CHF (congestive heart failure) (  [place]) 04/26/2018   Effort angina ([place]) 04/25/2018   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[place]) 01/25/2018   Hyperlipidemia due to type 2 diabetes mellitus ([place]) 08/12/2017   Vitamin D deficiency, unspecified 08/12/2017   Ptosis of left eyelid 01/08/2017   Dermatitis 12/24/2016   Difficulty walking 04/27/2016   Foot cramps 04/27/2016   Morbid (severe) obesity due to excess calories ([place]) 04/06/2016   Benign neoplasm of sigmoid colon    Benign neoplasm of descending colon    Benign neoplasm of transverse colon    Coronary artery disease involving native  coronary artery with angina pectoris ([place]) 10/22/2015   Coronary artery disease 08/22/2015   DM (diabetes mellitus), type 2, uncontrolled with complications 00/93/8182   Myasthenia gravis ([place]) 08/22/2015   Chronic left-sided low back pain 08/22/2015   Hypertension 08/22/2015   GERD (gastroesophageal reflux disease) 08/22/2015   Arthritis 08/22/2015   Diabetic peripheral neuropathy ([place]) 08/22/2015   Hyperlipidemia 08/22/2015   Insomnia 08/22/2015   Type 2 diabetes mellitus with diabetic polyneuropathy, with long-term current use of insulin ([place]) 08/22/2015   Asthma 08/22/2015   Lyme disease 06/05/1992    Past Surgical History:  Procedure Laterality Date   BILATERAL CARPAL TUNNEL RELEASE Bilateral L in 2012 and R in 2013   [place], most recent in  March 2016.   COLONOSCOPY WITH PROPOFOL N/A 01/10/2016   Procedure: COLONOSCOPY WITH PROPOFOL;  Surgeon: [name], MD;  Location: [facility];  Service: Endoscopy;  Laterality: N/A;   COLONOSCOPY WITH PROPOFOL N/A 04/14/2021   Procedure: COLONOSCOPY WITH PROPOFOL;  Surgeon: [name], MD;  Location: [place] [facility];  Service: Endoscopy;  Laterality: N/A;  Diabetic - insulin and oral meds   CORONARY ANGIOPLASTY     CORONARY STENT INTERVENTION N/A 04/25/2018   Procedure: CORONARY STENT INTERVENTION;  Surgeon: [name], MD;  Location: [place] [facility];  Service: Cardiovascular;  Laterality: N/A;   ESOPHAGOGASTRODUODENOSCOPY (EGD) WITH PROPOFOL N/A 01/10/2016   Procedure: ESOPHAGOGASTRODUODENOSCOPY (EGD) WITH PROPOFOL;  Surgeon: [name], MD;  Location: [facility];  Service: Endoscopy;  Laterality: N/A;   ESOPHAGOGASTRODUODENOSCOPY (EGD) WITH PROPOFOL N/A 04/14/2021   Procedure: ESOPHAGOGASTRODUODENOSCOPY (EGD) WITH BIOPSY;  Surgeon: [name], MD;  Location: [place];  Service: Endoscopy;  Laterality: N/A;   EYE SURGERY Bilateral 2012   cataract/bilateral vitrectomies    LEFT HEART CATH AND CORONARY ANGIOGRAPHY Left 04/25/2018   Procedure: LEFT HEART CATH AND CORONARY ANGIOGRAPHY;  Surgeon: [name], MD;  Location: [place] [facility];  Service: Cardiovascular;  Laterality: Left;   TEE WITHOUT CARDIOVERSION N/A 09/05/2018   Procedure: TRANSESOPHAGEAL ECHOCARDIOGRAM (TEE);  Surgeon: [name], MD;  Location: [facility];  Service: Cardiovascular;  Laterality: N/A;   TONSILLECTOMY AND ADENOIDECTOMY     As a child   TUNNELED VENOUS CATHETER PLACEMENT     removed    Family History  Problem Relation Age of Onset   Diabetes Mother    Heart disease Mother    Cancer Father        Prostate CA, Anal cancer    Dementia Father    Diabetes Brother    Healthy Brother    Healthy Brother     Social History   Tobacco Use   Smoking status: Never   Smokeless tobacco: Never   Tobacco comments:    smoking cessation materials not required  Substance Use Topics   Alcohol use: Not Currently    Alcohol/week: 0.0 standard  drinks     Current Outpatient Medications:    albuterol (PROVENTIL HFA;VENTOLIN HFA) 108 (90 Base) MCG/ACT inhaler, Inhale 2 puffs into the lungs every 6 (six) hours as needed for wheezing or shortness of breath. , Disp: , Rfl:    albuterol (PROVENTIL) (2.5 MG/3ML) 0.083% nebulizer solution, Take 2.5 mg by nebulization every 6 (six) hours as needed for wheezing or shortness of breath. , Disp: , Rfl:    aspirin EC 81 MG tablet, Take 1 tablet (81 mg total) by mouth daily., Disp: 90 tablet, Rfl: 3   clopidogrel (PLAVIX) 75 MG tablet, TAKE 1 TABLET(75 MG) BY MOUTH DAILY WITH BREAKFAST, Disp: 30 tablet, Rfl: 6   cyclobenzaprine (FLEXERIL) 10 MG tablet, Take 10 mg by mouth 2 (two) times daily as needed., Disp: , Rfl:    dexlansoprazole (DEXILANT) 60 MG capsule, Take 1 capsule (60 mg total) by mouth daily., Disp: 30 capsule, Rfl: 11   DULoxetine (CYMBALTA) 60 MG capsule, Take 1 capsule (60 mg total) by mouth daily., Disp: 90 capsule, Rfl: 1    ezetimibe (ZETIA) 10 MG tablet, TAKE 1 TABLET(10 MG) BY MOUTH DAILY, Disp: 90 tablet, Rfl: 3   Fluticasone-Umeclidin-Vilant (TRELEGY ELLIPTA) 100-62.5-25 MCG/INH AEPB, Inhale 1 each into the lungs daily. In place of breo, Disp: 60 each, Rfl: 2   furosemide (LASIX) 20 MG tablet, TAKE 1 TABLET BY MOUTH DAILY AS NEEDED FOR WEIGHT GAIN OF 2 POUNDS OVERNIGHT OR 5 POUNDS PER 1 WEEK, Disp: 90 tablet, Rfl: 0   gabapentin (NEURONTIN) 300 MG capsule, Take 1 capsule (300 mg total) by mouth 3 (three) times daily., Disp: 90 capsule, Rfl: 1   gabapentin (NEURONTIN) 800 MG tablet, Take 800 mg by mouth at bedtime., Disp: , Rfl:    insulin lispro (HUMALOG) 100 UNIT/ML KiwkPen, Inject 7-8 Units into the skin 3 (three) times daily. Reported on 12/02/2015/ sliding scale 1 unit for every 7 units of carbs; and 1 unit for every 20 above 120, Disp: , Rfl:    insulin lispro (HUMALOG) 100 UNIT/ML KwikPen, Inject into the skin., Disp: , Rfl:    Insulin Pen Needle 32G X 4 MM MISC, Inject 1 each into the skin as needed., Disp: , Rfl:    JARDIANCE 10 MG TABS tablet, TAKE 1 TABLET(10 MG) BY MOUTH DAILY BEFORE BREAKFAST, Disp: 30 tablet, Rfl: 0   LANTUS SOLOSTAR 100 UNIT/ML Solostar Pen, Inject 31-33 Units into the skin daily at 10 pm., Disp: , Rfl: 0   levocetirizine (XYZAL) 5 MG tablet, TAKE 1 TABLET(5 MG) BY MOUTH EVERY EVENING, Disp: 30 tablet, Rfl: 3   levothyroxine (SYNTHROID) 50 MCG tablet, TAKE 1 TABLET BY MOUTH DAILY BEFORE BREAKFAST AND 2 TABLETS ON SUNDAY. RECHECK LEVEL IN 6 WEEKS, Disp: 32 tablet, Rfl: 0   loratadine (CLARITIN) 10 MG tablet, Take 10 mg by mouth daily. , Disp: , Rfl:    metFORMIN (GLUCOPHAGE) 1000 MG tablet, Take 1,000 mg by mouth 2 (two) times daily with a meal., Disp: , Rfl:    metoprolol succinate (TOPROL-XL) 100 MG 24 hr tablet, TAKE 1 TABLET(100 MG) BY MOUTH DAILY WITH OR IMMEDIATELY FOLLOWING A MEAL, Disp: 90 tablet, Rfl: 2   montelukast (SINGULAIR) 10 MG tablet, Take 1 tablet by mouth at bedtime.,  Disp: , Rfl:    Multiple Vitamins-Minerals (MENS MULTI VITAMIN & MINERAL PO), Take 1 tablet by mouth in the morning and at bedtime., Disp: , Rfl:    naloxone (NARCAN) nasal spray 4 mg/0.1 mL, For excess sedation from opioids,  Disp: 1 kit, Rfl: 2   nitroGLYCERIN (NITROSTAT) 0.3 MG SL tablet, Place 1 tablet (0.3 mg total) under the tongue every 5 (five) minutes as needed for chest pain. Maximum 3 doses., Disp: 25 tablet, Rfl: 3   Oxycodone HCl 10 MG TABS, Take 1 tablet (10 mg total) by mouth 4 (four) times daily., Disp: 120 tablet, Rfl: 0   [START ON 08/07/2021] Oxycodone HCl 10 MG TABS, Take 1 tablet (10 mg total) by mouth in the morning, at noon, in the evening, and at bedtime., Disp: 120 tablet, Rfl: 0   OZEMPIC, 1 MG/DOSE, 4 MG/3ML SOPN, Inject 1 mg into the skin once a week., Disp: , Rfl:    rosuvastatin (CRESTOR) 5 MG tablet, Take 5 mg by mouth daily., Disp: , Rfl: 0   tamsulosin (FLOMAX) 0.4 MG CAPS capsule, TAKE 2 CAPSULES BY MOUTH DAILY, Disp: 60 capsule, Rfl: 2  Allergies  Allergen Reactions   Azathioprine Other (See Comments)    Azathioprine hypersensitivity reaction - Symptoms mimicking sepsis - was hospitalized   Novolog [Insulin Aspart] Hives    I personally reviewed active problem list, medication list, allergies, family history, social history, health maintenance with the patient/caregiver today.   ROS  Constitutional: Negative for fever , positive weight change.  Respiratory: Negative for cough , he has noticed increase of  shortness of breath.   Cardiovascular: Positive  for intermittent  chest pain but no palpitation  Gastrointestinal: Negative for abdominal pain, no bowel changes.  Musculoskeletal: positive for gait problem but no  joint swelling.  Skin: Negative for rash.  Neurological: Negative for dizziness or headache.  No other specific complaints in a complete review of systems (except as listed in HPI above).   Objective  Vitals:   08/01/21 1356  BP: 122/76   Pulse: 88  Resp: 16  Temp: 97.9 F (36.6 C)  SpO2: 100%  Weight: 215 lb 9.6 oz (97.8 kg)  Height: _0  (1.702 m)    Body mass index is 33.77 kg/m.  Physical Exam  Constitutional: Patient has some temporal waisting .  No distress.  HEENT: head atraumatic, normocephalic, pupils equal and reactive to light,  neck supple, mild ptosis of right eye lid  Cardiovascular: Normal rate, regular rhythm and normal heart sounds.  No murmur heard. No BLE edema. Pulmonary/Chest: Effort normal and he had wheezing on right lung field that cleared with a cough.  No respiratory distress. Abdominal: Soft.  There is no tenderness. Psychiatric: Patient has a normal mood and affect. behavior is normal. Judgment and thought content normal.   Recent Results (from the past 2160 hour(s))  Hemoglobin A1c     Status: None   Collection Time: 06/04/21 12:00 AM  Result Value Ref Range   Hemoglobin A1C 8.3     Comment: [facility]  Comp Met (CMET)     Status: Abnormal   Collection Time: 07/17/21 11:17 AM  Result Value Ref Range   Glucose 122 (H) 70 - 99 mg/dL    Comment:               **Please note reference interval change**   BUN 21 8 - 27 mg/dL   Creatinine, Ser 1.26 0.76 - 1.27 mg/dL   eGFR 64 >59 mL/min/1.73   BUN/Creatinine Ratio 17 10 - 24   Sodium 141 134 - 144 mmol/L   Potassium 5.0 3.5 - 5.2 mmol/L   Chloride 105 96 - 106 mmol/L   CO2 20 20 - 29 mmol/L  Calcium 9.7 8.6 - 10.2 mg/dL   Total Protein 6.2 6.0 - 8.5 g/dL   Albumin 4.2 3.8 - 4.8 g/dL   Globulin, Total 2.0 1.5 - 4.5 g/dL   Albumin/Globulin Ratio 2.1 1.2 - 2.2   Bilirubin Total 0.3 0.0 - 1.2 mg/dL   Alkaline Phosphatase 99 44 - 121 IU/L   AST 37 0 - 40 IU/L   ALT 25 0 - 44 IU/L  Lipid Profile     Status: Abnormal   Collection Time: 07/17/21 11:17 AM  Result Value Ref Range   Cholesterol, Total 92 (L) 100 - 199 mg/dL   Triglycerides 145 0 - 149 mg/dL   HDL 28 (L) >39 mg/dL   VLDL Cholesterol Cal 25 5 - 40 mg/dL   LDL Chol Calc  (NIH) 39 0 - 99 mg/dL   Chol/HDL Ratio 3.3 0.0 - 5.0 ratio    Comment:                                   T. Chol/HDL Ratio                                             Men  Women                               1/2 Avg.Risk  3.4    3.3                                   Avg.Risk  5.0    4.4                                2X Avg.Risk  9.6    7.1                                3X Avg.Risk 23.4   11.0   Direct LDL     Status: None   Collection Time: 07/17/21 11:17 AM  Result Value Ref Range   LDL Direct 36 0 - 99 mg/dL     PHQ2/9: Depression screen [facility] 2/9 08/01/2021 07/24/2021 06/23/2021 04/15/2021 04/02/2021  Decreased Interest 0 0 0 0 0  Down, Depressed, Hopeless 1 1 0 0 1  PHQ - 2 Score 1 1 0 0 1  Altered sleeping 0 0 - - 2  Tired, decreased energy 0 1 - - 0  Change in appetite 0 1 - - 1  Feeling bad or failure about yourself  0 0 - - 0  Trouble concentrating 0 0 - - 0  Moving slowly or fidgety/restless 0 0 - - 0  Suicidal thoughts 0 0 - - 0  PHQ-9 Score 1 3 - - 4  Difficult doing work/chores Not difficult at all Not difficult at all - - -  Some recent data might be hidden    phq 9 is positive   Fall Risk: Fall Risk  08/01/2021 07/24/2021 06/23/2021 04/15/2021 04/02/2021  Falls in the past year? _0 0  Comment - - - - -  Number  falls in past yr: 1 0 0 0 0  Comment - - - - -  Injury with Fall? _0 0  Comment - - - injured head -  Risk Factor Category  - - - - -  Risk for fall due to : - History of fall(s) No Fall Risks - -  Risk for fall due to: Comment - - - - -  Follow up - Falls prevention discussed Falls prevention discussed - -     Functional Status Survey: Is the patient deaf or have difficulty hearing?: Yes Does the patient have difficulty seeing, even when wearing glasses/contacts?: No Does the patient have difficulty concentrating, remembering, or making decisions?: No Does the patient have difficulty walking or climbing stairs?: Yes Does the patient have  difficulty dressing or bathing?: No Does the patient have difficulty doing errands alone such as visiting a doctor's office or shopping?: No    Assessment & Plan   1. Type 2 diabetes mellitus with diabetic polyneuropathy, with long-term current use of insulin (HCC)  Seeing Dr. Honor Junes   2. B12 deficiency   3. Vitamin D deficiency   4. Atherosclerosis of aorta ([place])  On statin therapy   5. Unstable angina [facility])  Recently seen by cardiologist  6. Myasthenia gravis ([place])   7. Moderate persistent asthmatic bronchitis without complication  Continue follow up with pulmonologist   8. Other iron deficiency anemia  - Ambulatory referral to Hematology / Oncology  9. Coronary artery disease of native artery of native heart with stable angina pectoris ([place])   10. Chronic combined systolic and diastolic CHF (congestive heart failure) ([place])   11. GAD (generalized anxiety disorder)  - DULoxetine (CYMBALTA) 30 MG capsule; Take 1 capsule (30 mg total) by mouth daily.  Dispense: 90 capsule; Refill: 1 - DULoxetine (CYMBALTA) 60 MG capsule; Take 1 capsule (60 mg total) by mouth daily.  Dispense: 90 capsule; Refill: 1  12. Mild protein-calorie malnutrition ([place])   13. Moderate episode of recurrent major depressive disorder (HCC)  - DULoxetine (CYMBALTA) 30 MG capsule; Take 1 capsule (30 mg total) by mouth daily.  Dispense: 90 capsule; Refill: 1 - DULoxetine (CYMBALTA) 60 MG capsule; Take 1 capsule (60 mg total) by mouth daily.  Dispense: 90 capsule; Refill: 1

## 2021-07-31 NOTE — Progress Notes (Signed)
This patient presents  to my office for at risk foot care.  This patient requires this care by a professional since this patient will be at risk due to having diabetic neuropathy.  This patient is unable to cut nails himself since the patient cannot reach his nails.These nails are painful walking and wearing shoes.  This patient presents for at risk foot care today.  General Appearance  Alert, conversant and in no acute stress.  Vascular  Dorsalis pedis and posterior tibial  pulses are palpable  bilaterally.  Capillary return is within normal limits  bilaterally. Temperature is within normal limits  bilaterally.  Neurologic  Senn-Weinstein monofilament wire test absent   bilaterally. Muscle power within normal limits bilaterally.  Nails Thick disfigured discolored nails with subungual debris  Hallux nails bilaterally. No evidence of bacterial infection or drainage bilaterally.  Orthopedic  No limitations of motion  feet .  No crepitus or effusions noted.  No bony pathology or digital deformities noted.  Mild  MTA.  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  Skin   No porokeratosis noted bilaterally.  No signs of infections or ulcers noted.  Dry peeling skin plantarly both feet.   Onychomycosis Hallux    Pain in right toes  Pain in left toes  Diabetic neuropathy.  Consent was obtained for treatment procedures.   Mechanical debridement of nails 1-5  bilaterally performed with a nail nipper.  Filed with dremel without incident. Patient says he is experiencing pain in outside bone left foot from previous surgery.  Told him to see Dr.  Posey Pronto for evaluation left foot again.   Return office visit   10 weeks                 Told patient to return for periodic foot care and evaluation due to potential at risk complications.   Gardiner Barefoot DPM

## 2021-08-01 ENCOUNTER — Encounter: Payer: Self-pay | Admitting: Family Medicine

## 2021-08-01 ENCOUNTER — Ambulatory Visit (INDEPENDENT_AMBULATORY_CARE_PROVIDER_SITE_OTHER): Payer: Medicare Other | Admitting: Family Medicine

## 2021-08-01 VITALS — BP 122/76 | HR 88 | Temp 97.9°F | Resp 16 | Ht 67.0 in | Wt 215.6 lb

## 2021-08-01 DIAGNOSIS — I200 Unstable angina: Secondary | ICD-10-CM | POA: Diagnosis not present

## 2021-08-01 DIAGNOSIS — E441 Mild protein-calorie malnutrition: Secondary | ICD-10-CM

## 2021-08-01 DIAGNOSIS — E559 Vitamin D deficiency, unspecified: Secondary | ICD-10-CM

## 2021-08-01 DIAGNOSIS — I5042 Chronic combined systolic (congestive) and diastolic (congestive) heart failure: Secondary | ICD-10-CM

## 2021-08-01 DIAGNOSIS — J4540 Moderate persistent asthma, uncomplicated: Secondary | ICD-10-CM

## 2021-08-01 DIAGNOSIS — E1142 Type 2 diabetes mellitus with diabetic polyneuropathy: Secondary | ICD-10-CM

## 2021-08-01 DIAGNOSIS — Z794 Long term (current) use of insulin: Secondary | ICD-10-CM

## 2021-08-01 DIAGNOSIS — I700 Atherosclerosis of aorta: Secondary | ICD-10-CM | POA: Diagnosis not present

## 2021-08-01 DIAGNOSIS — G7000 Myasthenia gravis without (acute) exacerbation: Secondary | ICD-10-CM

## 2021-08-01 DIAGNOSIS — I25118 Atherosclerotic heart disease of native coronary artery with other forms of angina pectoris: Secondary | ICD-10-CM

## 2021-08-01 DIAGNOSIS — D508 Other iron deficiency anemias: Secondary | ICD-10-CM

## 2021-08-01 DIAGNOSIS — F411 Generalized anxiety disorder: Secondary | ICD-10-CM

## 2021-08-01 DIAGNOSIS — E538 Deficiency of other specified B group vitamins: Secondary | ICD-10-CM | POA: Diagnosis not present

## 2021-08-01 DIAGNOSIS — F331 Major depressive disorder, recurrent, moderate: Secondary | ICD-10-CM

## 2021-08-01 MED ORDER — DULOXETINE HCL 30 MG PO CPEP: 30.0000 mg | ORAL_CAPSULE | Freq: Every day | ORAL | 1 refills | Status: DC

## 2021-08-01 MED ORDER — DULOXETINE HCL 60 MG PO CPEP: 60.0000 mg | ORAL_CAPSULE | Freq: Every day | ORAL | 1 refills | Status: DC

## 2021-08-05 ENCOUNTER — Telehealth: Payer: Self-pay | Admitting: Gastroenterology

## 2021-08-05 NOTE — Telephone Encounter (Signed)
Pt. Requesting a call back to schedule givens capsule study

## 2021-08-06 ENCOUNTER — Other Ambulatory Visit: Payer: Self-pay

## 2021-08-06 DIAGNOSIS — D500 Iron deficiency anemia secondary to blood loss (chronic): Secondary | ICD-10-CM

## 2021-08-06 NOTE — Telephone Encounter (Signed)
Pt scheduled for a capsule study at [facility] on 08/21/21

## 2021-08-12 ENCOUNTER — Other Ambulatory Visit: Payer: Self-pay

## 2021-08-12 ENCOUNTER — Ambulatory Visit: Payer: Medicare Other | Admitting: Podiatry

## 2021-08-12 ENCOUNTER — Encounter: Payer: Self-pay | Admitting: Podiatry

## 2021-08-12 DIAGNOSIS — Z9181 History of falling: Secondary | ICD-10-CM | POA: Diagnosis not present

## 2021-08-12 DIAGNOSIS — R2689 Other abnormalities of gait and mobility: Secondary | ICD-10-CM | POA: Diagnosis not present

## 2021-08-12 NOTE — Progress Notes (Signed)
Subjective:  Patient ID: [name], male    DOB: August 21, 1961,  MRN: [id number]  Chief Complaint  Patient presents with   Foot Pain    Left foot pain     60 y.o. male presents with the above complaint.  Patient presents with complaint of bilateral antalgic gait with constant instability when ambulating.  Patient states he had surgery on the left foot long time ago where he had 1/5 metatarsal break which was reduced and has a screw in his foot.  He states that it "hurts sometimes when and ranges or gets cold.  He overall has a primary complaint of following and has a history of multiple falls due to ankle and foot giving out from underneath him.  He states that he is tried various over-the-counter bracing none of which has helped.  He is a diabetic with last A1c of 8.3.  He denies any other acute complaints would like to discuss balance bracing if possible.   Review of Systems: Negative except as noted in the HPI. Denies N/V/F/Ch.  Past Medical History:  Diagnosis Date   Allergy    dust, seasonal (worse in the fall).   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 Followed by Pain Specialist in [place], back and neck   Asthma    BRONCHITIS   Cataract    First Dx in 2012   Chronic combined systolic and diastolic congestive heart failure ([place])    a. 03/2018 Echo: EF 30-35%; b. 07/2018 Echo: EF 35-40%; c. 09/2019 TEE: EF 40-45%; d. 12/2020 Echo: EF 35-40%, sev apical ant, apical lat, apical inf, apical HK. GrI DD, nl RV fxn.   Coronary artery disease    a. Prior Ant MI->s/p multiple stents placed in the LAD and right coronary artery ([place]); b. 2016 Cath: reportedly nonobs dzs;  c. 04/2018 Cath/PCI: LM nl, LAD 20p, patent mid stent, LCX [id number] [place] DES), OM1 nl, OM2 50, OM3 40 w/ patent stent, RCA 40p, 244m40d w/ patent stent in RPDA, RPAV 60, EF 25-35%. 2+MR; d. 02/2019 MV: Apical scar, no isch, EF 30-44%.   Deaf, left    Diabetes mellitus without complication ([place] [place]   TYPE 2    Diabetic peripheral neuropathy ([facility]   feet and hands   FUO (fever of unknown origin) 08/03/2018   GERD (gastroesophageal reflux disease)    Headache    muscle tension   Hyperlipidemia    Hypertension    CONTROLLED ON MEDS   Hyperthyroidism    Insomnia    Ischemic cardiomyopathy    a. 03/2018 Echo: EF 30-35%, ant, antlat, apical AK, Gr1 DD; b. 07/2018 Echo: EF 35-40%, anteroseptal, apical, and ant HK. Gr1 DD; c. 09/2019 TEE: EF 40-45%; d. 12/2020 Echo: EF 35-40%, sev apical ant, apical lat, apical inf, apical HK. GrI DD, nl RV fxn.   Knee pain, acute 05/06/2020   Left arm weakness 10/04/2019   Left leg weakness 12/01/2019   Lyme disease    Chronic   Myasthenia gravis ([facility]   (03/18/21 - no current treatment - "better than it has ever been" per pt)   Myocardial infarction ([facility]   Palpitations    a. 10/2020 Zio: RSR, 88 avg. 3 brief SVT episodes (max 5 beats @ 128). Rare PACs/PVCs. Triggered events did not correlate w/ significant arrthymia - some w/ sinus tach.   Seasonal allergies    Sepsis ([facility]   a.07/2018 - unknown source. TEE neg for  veg 09/2019.   Sleep apnea    CPAP   Wears hearing aid in both ears     Current Outpatient Medications:    albuterol (PROVENTIL HFA;VENTOLIN HFA) 108 (90 Base) MCG/ACT inhaler, Inhale 2 puffs into the lungs every 6 (six) hours as needed for wheezing or shortness of breath. , Disp: , Rfl:    albuterol (PROVENTIL) (2.5 MG/3ML) 0.083% nebulizer solution, Take 2.5 mg by nebulization every 6 (six) hours as needed for wheezing or shortness of breath. , Disp: , Rfl:    aspirin EC 81 MG tablet, Take 1 tablet (81 mg total) by mouth daily., Disp: 90 tablet, Rfl: 3   clopidogrel (PLAVIX) 75 MG tablet, TAKE 1 TABLET(75 MG) BY MOUTH DAILY WITH BREAKFAST, Disp: 30 tablet, Rfl: 6   cyclobenzaprine (FLEXERIL) 10 MG tablet, Take 10 mg by mouth 2 (two) times daily as needed., Disp: , Rfl:    dexlansoprazole (DEXILANT) 60 MG capsule, Take 1 capsule (60 mg  total) by mouth daily., Disp: 30 capsule, Rfl: 11   DULoxetine (CYMBALTA) 30 MG capsule, Take 1 capsule (30 mg total) by mouth daily., Disp: 90 capsule, Rfl: 1   DULoxetine (CYMBALTA) 60 MG capsule, Take 1 capsule (60 mg total) by mouth daily., Disp: 90 capsule, Rfl: 1   ezetimibe (ZETIA) 10 MG tablet, TAKE 1 TABLET(10 MG) BY MOUTH DAILY, Disp: 90 tablet, Rfl: 3   Fluticasone-Umeclidin-Vilant (TRELEGY ELLIPTA) 100-62.5-25 MCG/INH AEPB, Inhale 1 each into the lungs daily. In place of breo, Disp: 60 each, Rfl: 2   furosemide (LASIX) 20 MG tablet, TAKE 1 TABLET BY MOUTH DAILY AS NEEDED FOR WEIGHT GAIN OF 2 POUNDS OVERNIGHT OR 5 POUNDS PER 1 WEEK, Disp: 90 tablet, Rfl: 0   gabapentin (NEURONTIN) 300 MG capsule, Take 1 capsule (300 mg total) by mouth 3 (three) times daily., Disp: 90 capsule, Rfl: 1   gabapentin (NEURONTIN) 800 MG tablet, Take 800 mg by mouth at bedtime., Disp: , Rfl:    insulin lispro (HUMALOG) 100 UNIT/ML KiwkPen, Inject 7-8 Units into the skin 3 (three) times daily. Reported on 12/02/2015/ sliding scale 1 unit for every 7 units of carbs; and 1 unit for every 20 above 120, Disp: , Rfl:    Insulin Pen Needle 32G X 4 MM MISC, Inject 1 each into the skin as needed., Disp: , Rfl:    JARDIANCE 10 MG TABS tablet, TAKE 1 TABLET(10 MG) BY MOUTH DAILY BEFORE BREAKFAST, Disp: 30 tablet, Rfl: 0   LANTUS SOLOSTAR 100 UNIT/ML Solostar Pen, Inject 23-29 Units into the skin daily at 10 pm., Disp: , Rfl: 0   levocetirizine (XYZAL) 5 MG tablet, TAKE 1 TABLET(5 MG) BY MOUTH EVERY EVENING, Disp: 30 tablet, Rfl: 3   levothyroxine (SYNTHROID) 50 MCG tablet, TAKE 1 TABLET BY MOUTH DAILY BEFORE BREAKFAST AND 2 TABLETS ON SUNDAY. RECHECK LEVEL IN 6 WEEKS, Disp: 32 tablet, Rfl: 0   loratadine (CLARITIN) 10 MG tablet, Take 10 mg by mouth daily. , Disp: , Rfl:    metFORMIN (GLUCOPHAGE) 1000 MG tablet, Take 1,000 mg by mouth 2 (two) times daily with a meal., Disp: , Rfl:    metoprolol succinate (TOPROL-XL) 100 MG 24  hr tablet, TAKE 1 TABLET(100 MG) BY MOUTH DAILY WITH OR IMMEDIATELY FOLLOWING A MEAL, Disp: 90 tablet, Rfl: 2   montelukast (SINGULAIR) 10 MG tablet, Take 1 tablet by mouth at bedtime., Disp: , Rfl:    Multiple Vitamins-Minerals (MENS MULTI VITAMIN & MINERAL PO), Take 1 tablet by mouth in the  morning and at bedtime., Disp: , Rfl:    naloxone (NARCAN) nasal spray 4 mg/0.1 mL, For excess sedation from opioids, Disp: 1 kit, Rfl: 2   nitroGLYCERIN (NITROSTAT) 0.3 MG SL tablet, Place 1 tablet (0.3 mg total) under the tongue every 5 (five) minutes as needed for chest pain. Maximum 3 doses., Disp: 25 tablet, Rfl: 3   Oxycodone HCl 10 MG TABS, Take 1 tablet (10 mg total) by mouth in the morning, at noon, in the evening, and at bedtime., Disp: 120 tablet, Rfl: 0   OZEMPIC, 1 MG/DOSE, 4 MG/3ML SOPN, Inject 1 mg into the skin once a week., Disp: , Rfl:    rosuvastatin (CRESTOR) 5 MG tablet, Take 5 mg by mouth daily., Disp: , Rfl: 0   tamsulosin (FLOMAX) 0.4 MG CAPS capsule, TAKE 2 CAPSULES BY MOUTH DAILY, Disp: 60 capsule, Rfl: 2  Social History   Tobacco Use  Smoking Status Never  Smokeless Tobacco Never  Tobacco Comments   smoking cessation materials not required    Allergies  Allergen Reactions   Azathioprine Other (See Comments)    Azathioprine hypersensitivity reaction - Symptoms mimicking sepsis - was hospitalized   Novolog [Insulin Aspart] Hives   Objective:  There were no vitals filed for this visit. There is no height or weight on file to calculate BMI. Constitutional Well developed. Well nourished.  Vascular Dorsalis pedis pulses palpable bilaterally. Posterior tibial pulses palpable bilaterally. Capillary refill normal to all digits.  No cyanosis or clubbing noted. Pedal hair growth normal.  Neurologic Normal speech. Oriented to person, place, and time. Epicritic sensation to light touch grossly present bilaterally.  Dermatologic Nails well groomed and normal in appearance. No  open wounds. No skin lesions.  Orthopedic: Gait examination shows antalgic gait with weakness of the ambulatory status of bilateral foot.  High arch foot structure noted with calcaneal varus upon heel strike.  Supination of the forefoot noted.  Negative anterior drawer test or talar tilt test.   Radiographs: None Assessment:   1. Antalgic gait   2. History of fall    Plan:  Patient was evaluated and treated and all questions answered.  Antalgic/unstable gait bilaterally with history of multiple falls -I explained the patient the etiology of antalgic gait and various treatment options were discussed.  Given that he has multiple history of falls due to his ankle and foot giving out from underneath him I believe he will benefit from [facility] balance bracing.  He will be scheduled to see EJ for podiatrist to be scheduled for balance bracing. -I discussed with him that if there is any improved balance bracing does this first we will go if there is no improvement we will discuss other options.  Patient states understanding.    No follow-ups on file.

## 2021-08-13 ENCOUNTER — Encounter: Payer: Self-pay | Admitting: Oncology

## 2021-08-13 ENCOUNTER — Inpatient Hospital Stay: Payer: Medicare Other | Attending: Oncology | Admitting: Oncology

## 2021-08-13 ENCOUNTER — Inpatient Hospital Stay: Payer: Medicare Other

## 2021-08-13 VITALS — BP 106/75 | HR 95 | Temp 96.2°F | Resp 17 | Wt 216.0 lb

## 2021-08-13 DIAGNOSIS — I5042 Chronic combined systolic (congestive) and diastolic (congestive) heart failure: Secondary | ICD-10-CM | POA: Insufficient documentation

## 2021-08-13 DIAGNOSIS — I110 Hypertensive heart disease with heart failure: Secondary | ICD-10-CM | POA: Insufficient documentation

## 2021-08-13 DIAGNOSIS — R634 Abnormal weight loss: Secondary | ICD-10-CM | POA: Insufficient documentation

## 2021-08-13 DIAGNOSIS — D500 Iron deficiency anemia secondary to blood loss (chronic): Secondary | ICD-10-CM | POA: Insufficient documentation

## 2021-08-13 DIAGNOSIS — Z8042 Family history of malignant neoplasm of prostate: Secondary | ICD-10-CM | POA: Diagnosis not present

## 2021-08-13 DIAGNOSIS — E1142 Type 2 diabetes mellitus with diabetic polyneuropathy: Secondary | ICD-10-CM | POA: Insufficient documentation

## 2021-08-13 DIAGNOSIS — K2980 Duodenitis without bleeding: Secondary | ICD-10-CM | POA: Diagnosis not present

## 2021-08-13 DIAGNOSIS — K319 Disease of stomach and duodenum, unspecified: Secondary | ICD-10-CM | POA: Diagnosis not present

## 2021-08-13 LAB — TECHNOLOGIST SMEAR REVIEW
Plt Morphology: NORMAL
RBC MORPHOLOGY: NORMAL
WBC MORPHOLOGY: NORMAL

## 2021-08-13 LAB — CBC WITH DIFFERENTIAL/PLATELET
Abs Immature Granulocytes: 0.01 10*3/uL (ref 0.00–0.07)
Basophils Absolute: 0 10*3/uL (ref 0.0–0.1)
Basophils Relative: 0 %
Eosinophils Absolute: 0.4 10*3/uL (ref 0.0–0.5)
Eosinophils Relative: 6 %
HCT: 38 % — ABNORMAL LOW (ref 39.0–52.0)
Hemoglobin: 11.9 g/dL — ABNORMAL LOW (ref 13.0–17.0)
Immature Granulocytes: 0 %
Lymphocytes Relative: 13 %
Lymphs Abs: 0.9 10*3/uL (ref 0.7–4.0)
MCH: 26.9 pg (ref 26.0–34.0)
MCHC: 31.3 g/dL (ref 30.0–36.0)
MCV: 86 fL (ref 80.0–100.0)
Monocytes Absolute: 0.5 10*3/uL (ref 0.1–1.0)
Monocytes Relative: 7 %
Neutro Abs: 5.2 10*3/uL (ref 1.7–7.7)
Neutrophils Relative %: 74 %
Platelets: 210 10*3/uL (ref 150–400)
RBC: 4.42 MIL/uL (ref 4.22–5.81)
RDW: 14.7 % (ref 11.5–15.5)
WBC: 7 10*3/uL (ref 4.0–10.5)
nRBC: 0 % (ref 0.0–0.2)

## 2021-08-13 LAB — IRON AND TIBC
Iron: 53 ug/dL (ref 45–182)
Saturation Ratios: 13 % — ABNORMAL LOW (ref 17.9–39.5)
TIBC: 402 ug/dL (ref 250–450)
UIBC: 349 ug/dL

## 2021-08-13 LAB — FERRITIN: Ferritin: 6 ng/mL — ABNORMAL LOW (ref 24–336)

## 2021-08-13 LAB — RETIC PANEL
Immature Retic Fract: 19.4 % — ABNORMAL HIGH (ref 2.3–15.9)
RBC.: 4.44 MIL/uL (ref 4.22–5.81)
Retic Count, Absolute: 59.9 10*3/uL (ref 19.0–186.0)
Retic Ct Pct: 1.4 % (ref 0.4–3.1)
Reticulocyte Hemoglobin: 29.5 pg (ref >27.9)

## 2021-08-13 NOTE — Progress Notes (Signed)
Patient here for oncology follow-up appointment, concerns of SOB, neuropathy, back pain and decreased appetite

## 2021-08-13 NOTE — Progress Notes (Signed)
Hematology/Oncology Consult note [phone] [phone]      Patient Care Team: [name], MD as PCP - General (Family Medicine) [name], MD as PCP - Cardiology (Cardiology) Erby Pian, MD as Consulting Physician (Pulmonary Disease) Lonia Farber, MD as Consulting Physician (Endocrinology) [name], MD as Consulting Physician (Gastroenterology) Germaine Pomfret, [facility] (Pharmacist) Molli Barrows, MD as Consulting Physician (Pain Medicine)  REFERRING PROVIDER: [name], MD  CHIEF COMPLAINTS/REASON FOR VISIT:  Evaluation of iron deficiency anemia  HISTORY OF PRESENTING ILLNESS:   [name] is a  60 y.o.  male with PMH listed below was seen in consultation at the request of  [name], MD  for evaluation of iron deficiency anemia  Patient had a CBC done on 04/02/2021, CBC showed a hemoglobin of 11.7, MCV 84.5.  Platelets 247 and WBC 8.1. 04/14/2021, patient has had upper endoscopy and colonoscopy. EGD showed gastritis and duodenitis, stomach biopsy showed antral mucosa with moderate chronic and focally active gastritis.  Negative for intestinal metaplasia, dysplasia and malignancy.  Duodenum biopsy showed duodenal mucosa with peptic duodenitis.  Negative for dysplasia and malignancy..  Colonoscopy showed nonbleeding internal hemorrhoids. Patient has capsule study scheduled in the near future to complete his GI work-up.   He was accompanied by his wife today.  Patient has had lost weight since earlier this year.  He has nausea and has decreased appetite.  He has asthma and he feels congested recently.  Some cough. + Fatigue, decreased exercise induration denies any black bowel movements or blood in the stool.   Review of Systems  Constitutional:  Positive for appetite change, fatigue and unexpected weight change. Negative for chills and fever.  HENT:   Negative for hearing loss and voice change.   Eyes:  Negative  for eye problems and icterus.  Respiratory:  Positive for cough. Negative for chest tightness and shortness of breath.   Cardiovascular:  Negative for chest pain and leg swelling.  Gastrointestinal:  Positive for nausea. Negative for abdominal distention, abdominal pain, blood in stool and vomiting.  Endocrine: Negative for hot flashes.  Genitourinary:  Negative for difficulty urinating, dysuria and frequency.   Musculoskeletal:  Negative for arthralgias.  Skin:  Negative for itching and rash.  Neurological:  Negative for light-headedness and numbness.  Hematological:  Negative for adenopathy. Does not bruise/bleed easily.  Psychiatric/Behavioral:  Negative for confusion.    MEDICAL HISTORY:  Past Medical History:  Diagnosis Date   Allergy    dust, seasonal (worse in the fall).   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 Followed by Pain Specialist in [place], back and neck   Asthma    BRONCHITIS   Cataract    First Dx in 2012   Chronic combined systolic and diastolic congestive heart failure ([place])    a. 03/2018 Echo: EF 30-35%; b. 07/2018 Echo: EF 35-40%; c. 09/2019 TEE: EF 40-45%; d. 12/2020 Echo: EF 35-40%, sev apical ant, apical lat, apical inf, apical HK. GrI DD, nl RV fxn.   Coronary artery disease    a. Prior Ant MI->s/p multiple stents placed in the LAD and right coronary artery ([place]); b. 2016 Cath: reportedly nonobs dzs;  c. 04/2018 Cath/PCI: LM nl, LAD 20p, patent mid stent, LCX 36m(3.25x15 Sierra DES), OM1 nl, OM2 50, OM3 40 w/ patent stent, RCA 40p, 39m, 40d w/ patent stent in RPDA, RPAV 60, EF 25-35%. 2+MR; d. 02/2019 MV: Apical scar, no isch, EF 30-44%.  Deaf, left    Diabetes mellitus without complication ([place])    TYPE 2   Diabetic peripheral neuropathy ([place])    feet and hands   FUO (fever of unknown origin) 08/03/2018   GERD (gastroesophageal reflux disease)    Headache    muscle tension   Hyperlipidemia    Hypertension    CONTROLLED ON MEDS   Hyperthyroidism     Insomnia    Ischemic cardiomyopathy    a. 03/2018 Echo: EF 30-35%, ant, antlat, apical AK, Gr1 DD; b. 07/2018 Echo: EF 35-40%, anteroseptal, apical, and ant HK. Gr1 DD; c. 09/2019 TEE: EF 40-45%; d. 12/2020 Echo: EF 35-40%, sev apical ant, apical lat, apical inf, apical HK. GrI DD, nl RV fxn.   Knee pain, acute 05/06/2020   Left arm weakness 10/04/2019   Left leg weakness 12/01/2019   Lyme disease    Chronic   Myasthenia gravis ([place])    (03/18/21 - no current treatment - "better than it has ever been" per pt)   Myocardial infarction ([place]) 2010   Palpitations    a. 10/2020 Zio: RSR, 88 avg. 3 brief SVT episodes (max 5 beats @ 128). Rare PACs/PVCs. Triggered events did not correlate w/ significant arrthymia - some w/ sinus tach.   Seasonal allergies    Sepsis ([place])    a.07/2018 - unknown source. TEE neg for veg 09/2019.   Sleep apnea    CPAP   Wears hearing aid in both ears     SURGICAL HISTORY: Past Surgical History:  Procedure Laterality Date   BILATERAL CARPAL TUNNEL RELEASE Bilateral L in 2012 and R in 2013   [place], most recent in  March 2016.   COLONOSCOPY WITH PROPOFOL N/A 01/10/2016   Procedure: COLONOSCOPY WITH PROPOFOL;  Surgeon: [name], MD;  Location: [facility];  Service: Endoscopy;  Laterality: N/A;   COLONOSCOPY WITH PROPOFOL N/A 04/14/2021   Procedure: COLONOSCOPY WITH PROPOFOL;  Surgeon: [name], MD;  Location: [place];  Service: Endoscopy;  Laterality: N/A;  Diabetic - insulin and oral meds   CORONARY ANGIOPLASTY     CORONARY STENT INTERVENTION N/A 04/25/2018   Procedure: CORONARY STENT INTERVENTION;  Surgeon: [name], MD;  Location: [place] [facility];  Service: Cardiovascular;  Laterality: N/A;   ESOPHAGOGASTRODUODENOSCOPY (EGD) WITH PROPOFOL N/A 01/10/2016   Procedure: ESOPHAGOGASTRODUODENOSCOPY (EGD) WITH PROPOFOL;  Surgeon: [name], MD;  Location: [facility];  Service: Endoscopy;  Laterality:  N/A;   ESOPHAGOGASTRODUODENOSCOPY (EGD) WITH PROPOFOL N/A 04/14/2021   Procedure: ESOPHAGOGASTRODUODENOSCOPY (EGD) WITH BIOPSY;  Surgeon: [name], MD;  Location: [place];  Service: Endoscopy;  Laterality: N/A;   EYE SURGERY Bilateral 2012   cataract/bilateral vitrectomies   LEFT HEART CATH AND CORONARY ANGIOGRAPHY Left 04/25/2018   Procedure: LEFT HEART CATH AND CORONARY ANGIOGRAPHY;  Surgeon: [name], MD;  Location: [place] [facility];  Service: Cardiovascular;  Laterality: Left;   TEE WITHOUT CARDIOVERSION N/A 09/05/2018   Procedure: TRANSESOPHAGEAL ECHOCARDIOGRAM (TEE);  Surgeon: [name], MD;  Location: [facility];  Service: Cardiovascular;  Laterality: N/A;   TONSILLECTOMY AND ADENOIDECTOMY     As a child   TUNNELED VENOUS CATHETER PLACEMENT     removed    SOCIAL HISTORY: Social History   Socioeconomic History   Marital status: Married    Spouse name: Neoma Laming   Number of children: 0   Years of education: Not on file   Highest education level: Master's  degree (e.g., MA, MS, MEng, MEd, MSW, MBA)  Occupational History   Occupation: disabled    Comment: multiple medical problems - first approved for uncontrolled DM, but now has heart disease and Myasthenia Gravis   Tobacco Use   Smoking status: Never   Smokeless tobacco: Never   Tobacco comments:    smoking cessation materials not required  Vaping Use   Vaping Use: Never used  Substance and Sexual Activity   Alcohol use: Not Currently    Alcohol/week: 0.0 standard drinks   Drug use: No   Sexual activity: Not Currently    Partners: Female  Other Topics Concern   Not on file  Social History Narrative   Not on file   Social Determinants of Health   Financial Resource Strain: Low Risk    Difficulty of Paying Living Expenses: Not very hard  Food Insecurity: No Food Insecurity   Worried About Charity fundraiser in the Last Year: Never true   Ran Out of Food in the Last Year: Never true   Transportation Needs: No Transportation Needs   Lack of Transportation (Medical): No   Lack of Transportation (Non-Medical): No  Physical Activity: Insufficiently Active   Days of Exercise per Week: 7 days   Minutes of Exercise per Session: 20 min  Stress: Stress Concern Present   Feeling of Stress : To some extent  Social Connections: Socially Integrated   Frequency of Communication with Friends and Family: More than three times a week   Frequency of Social Gatherings with Friends and Family: Three times a week   Attends Religious Services: More than 4 times per year   Active Member of Clubs or Organizations: Yes   Attends Music therapist: More than 4 times per year   Marital Status: Married  Human resources officer Violence: Not At Risk   Fear of Current or Ex-Partner: No   Emotionally Abused: No   Physically Abused: No   Sexually Abused: No    FAMILY HISTORY: Family History  Problem Relation Age of Onset   Diabetes Mother    Heart disease Mother    Cancer Father        Prostate CA, Anal cancer    Dementia Father    Diabetes Brother    Healthy Brother    Healthy Brother     ALLERGIES:  is allergic to azathioprine and novolog [insulin aspart].  MEDICATIONS:  Current Outpatient Medications  Medication Sig Dispense Refill   albuterol (PROVENTIL HFA;VENTOLIN HFA) 108 (90 Base) MCG/ACT inhaler Inhale 2 puffs into the lungs every 6 (six) hours as needed for wheezing or shortness of breath.      albuterol (PROVENTIL) (2.5 MG/3ML) 0.083% nebulizer solution Take 2.5 mg by nebulization every 6 (six) hours as needed for wheezing or shortness of breath.      aspirin EC 81 MG tablet Take 1 tablet (81 mg total) by mouth daily. 90 tablet 3   clopidogrel (PLAVIX) 75 MG tablet TAKE 1 TABLET(75 MG) BY MOUTH DAILY WITH BREAKFAST 30 tablet 6   cyclobenzaprine (FLEXERIL) 10 MG tablet Take 10 mg by mouth 2 (two) times daily as needed.     dexlansoprazole (DEXILANT) 60 MG capsule  Take 1 capsule (60 mg total) by mouth daily. 30 capsule 11   DULoxetine (CYMBALTA) 30 MG capsule Take 1 capsule (30 mg total) by mouth daily. 90 capsule 1   DULoxetine (CYMBALTA) 60 MG capsule Take 1 capsule (60 mg total) by mouth daily. 90 capsule 1  ezetimibe (ZETIA) 10 MG tablet TAKE 1 TABLET(10 MG) BY MOUTH DAILY 90 tablet 3   Fluticasone-Umeclidin-Vilant (TRELEGY ELLIPTA) 100-62.5-25 MCG/INH AEPB Inhale 1 each into the lungs daily. In place of breo 60 each 2   furosemide (LASIX) 20 MG tablet TAKE 1 TABLET BY MOUTH DAILY AS NEEDED FOR WEIGHT GAIN OF 2 POUNDS OVERNIGHT OR 5 POUNDS PER 1 WEEK 90 tablet 0   gabapentin (NEURONTIN) 300 MG capsule Take 1 capsule (300 mg total) by mouth 3 (three) times daily. 90 capsule 1   gabapentin (NEURONTIN) 800 MG tablet Take 800 mg by mouth at bedtime.     insulin lispro (HUMALOG) 100 UNIT/ML KiwkPen Inject 7-8 Units into the skin 3 (three) times daily. Reported on 12/02/2015/ sliding scale 1 unit for every 7 units of carbs; and 1 unit for every 20 above 120     Insulin Pen Needle 32G X 4 MM MISC Inject 1 each into the skin as needed.     JARDIANCE 10 MG TABS tablet TAKE 1 TABLET(10 MG) BY MOUTH DAILY BEFORE BREAKFAST 30 tablet 0   LANTUS SOLOSTAR 100 UNIT/ML Solostar Pen Inject 23-29 Units into the skin daily at 10 pm.  0   levocetirizine (XYZAL) 5 MG tablet TAKE 1 TABLET(5 MG) BY MOUTH EVERY EVENING 30 tablet 3   levothyroxine (SYNTHROID) 50 MCG tablet TAKE 1 TABLET BY MOUTH DAILY BEFORE BREAKFAST AND 2 TABLETS ON SUNDAY. RECHECK LEVEL IN 6 WEEKS 32 tablet 0   loratadine (CLARITIN) 10 MG tablet Take 10 mg by mouth daily.      metFORMIN (GLUCOPHAGE) 1000 MG tablet Take 1,000 mg by mouth 2 (two) times daily with a meal.     metoprolol succinate (TOPROL-XL) 100 MG 24 hr tablet TAKE 1 TABLET(100 MG) BY MOUTH DAILY WITH OR IMMEDIATELY FOLLOWING A MEAL 90 tablet 2   montelukast (SINGULAIR) 10 MG tablet Take 1 tablet by mouth at bedtime.     Multiple  Vitamins-Minerals (MENS MULTI VITAMIN & MINERAL PO) Take 1 tablet by mouth in the morning and at bedtime.     naloxone (NARCAN) nasal spray 4 mg/0.1 mL For excess sedation from opioids 1 kit 2   nitroGLYCERIN (NITROSTAT) 0.3 MG SL tablet Place 1 tablet (0.3 mg total) under the tongue every 5 (five) minutes as needed for chest pain. Maximum 3 doses. 25 tablet 3   Oxycodone HCl 10 MG TABS Take 1 tablet (10 mg total) by mouth in the morning, at noon, in the evening, and at bedtime. 120 tablet 0   OZEMPIC, 1 MG/DOSE, 4 MG/3ML SOPN Inject 1 mg into the skin once a week.     rosuvastatin (CRESTOR) 5 MG tablet Take 5 mg by mouth daily.  0   tamsulosin (FLOMAX) 0.4 MG CAPS capsule TAKE 2 CAPSULES BY MOUTH DAILY 60 capsule 2   No current facility-administered medications for this visit.     PHYSICAL EXAMINATION: ECOG PERFORMANCE STATUS: 1 - Symptomatic but completely ambulatory Vitals:   08/13/21 1130  BP: 106/75  Pulse: 95  Resp: 17  Temp: (!) 96.2 F (35.7 C)  SpO2: 97%   Filed Weights   08/13/21 1130  Weight: 216 lb (98 kg)    Physical Exam Constitutional:      General: He is not in acute distress. HENT:     Head: Normocephalic and atraumatic.  Eyes:     General: No scleral icterus. Cardiovascular:     Rate and Rhythm: Normal rate and regular rhythm.  Heart sounds: Normal heart sounds.  Pulmonary:     Effort: Pulmonary effort is normal. No respiratory distress.     Breath sounds: No wheezing.  Abdominal:     General: Bowel sounds are normal. There is no distension.     Palpations: Abdomen is soft.  Musculoskeletal:        General: No deformity. Normal range of motion.     Cervical back: Normal range of motion and neck supple.  Skin:    General: Skin is warm and dry.     Findings: No erythema or rash.  Neurological:     Mental Status: He is alert and oriented to person, place, and time. Mental status is at baseline.     Cranial Nerves: No cranial nerve deficit.      Coordination: Coordination normal.  Psychiatric:        Mood and Affect: Mood normal.    LABORATORY DATA:  I have reviewed the data as listed Lab Results  Component Value Date   WBC 7.0 08/13/2021   HGB 11.9 (L) 08/13/2021   HCT 38.0 (L) 08/13/2021   MCV 86.0 08/13/2021   PLT 210 08/13/2021   Recent Labs    09/17/20 1117 12/09/20 1408 12/10/20 1028 12/25/20 1135 07/17/21 1117  NA 141   < > 135 138 141  K 5.2   < > 5.2* 4.9 5.0  CL 103   < > 103 104 105  CO2 24   < > $R'26 26 20  'mJ$ GLUCOSE 164*   < > 193* 243* 122*  BUN 30*   < > 27* 25* 21  CREATININE 1.42*   < > 1.21 1.22 1.26  CALCIUM 9.4   < > 8.9 9.2 9.7  GFRNONAA 53*  --  >60 >60  --   GFRAA 61  --   --   --   --   PROT 6.1  --   --   --  6.2  ALBUMIN 4.2  --   --   --  4.2  AST 17  --   --   --  37  ALT 20  --   --   --  25  ALKPHOS 90  --   --   --  99  BILITOT 0.3  --   --   --  0.3   < > = values in this interval not displayed.   Iron/TIBC/Ferritin/ %Sat    Component Value Date/Time   IRON 53 08/13/2021 1206   TIBC 402 08/13/2021 1206   FERRITIN 6 (L) 08/13/2021 1206   IRONPCTSAT 13 (L) 08/13/2021 1206   IRONPCTSAT 8 (L) 04/02/2021 1449      RADIOGRAPHIC STUDIES: I have personally reviewed the radiological images as listed and agreed with the findings in the report. No results found.    ASSESSMENT & PLAN:  1. Iron deficiency anemia due to chronic blood loss   2. Gastropathy   3. Unintentional weight loss    Iron deficiency anemia, likely secondary to GI blood loss due to gastritis and duodenitis. Continue follow-up with gastroenterology and complete GI work-up. Discussed with patient about options of oral iron supplementation versus infusional iron treatments. GI toxicity is a concern for oral iron supplementation given his chronic nausea/decreased appetite symptoms. Rationale and potential side effects of IV Venofer including allergy reactions/infusion reaction -anaphylactic reaction were  discussed with patient. Other side effects include but not limited to high blood pressure, skin rash, weight gain, leg swelling, etc. Patient voices  understanding and willing to proceed.  Plan IV Venofer weekly x4. He has not had any recent CBC and iron panel done and I will repeat today.  Check smear, reticulocyte panel as well.  Unintentional weight loss, differential diagnosis are broad, decreased p.o. intake due to GI symptoms, medication side effects-patient is on Ozempic or underlying malignancy. Finish GI work-up.  Close monitor Orders Placed This Encounter  Procedures   Iron and TIBC    Standing Status:   Future    Number of Occurrences:   1    Standing Expiration Date:   08/13/2022   Ferritin    Standing Status:   Future    Number of Occurrences:   1    Standing Expiration Date:   02/10/2022   Technologist smear review    Standing Status:   Future    Number of Occurrences:   1    Standing Expiration Date:   08/13/2022   CBC with Differential/Platelet    Standing Status:   Future    Number of Occurrences:   1    Standing Expiration Date:   08/13/2022   Retic Panel    Standing Status:   Future    Number of Occurrences:   1    Standing Expiration Date:   08/13/2022    All questions were answered. The patient knows to call the clinic with any problems questions or concerns.  cc [name], MD    Return of visit: We will offer patient weekly IV Venofer treatment x4.  Follow-up in 3 months. Thank you for this kind referral and the opportunity to participate in the care of this patient. A copy of today's note is routed to referring provider   Earlie Server, MD, PhD [facility] Hematology Oncology 08/13/2021

## 2021-08-14 ENCOUNTER — Ambulatory Visit: Payer: Medicare Other | Admitting: Podiatry

## 2021-08-19 ENCOUNTER — Telehealth: Payer: Self-pay

## 2021-08-19 NOTE — Telephone Encounter (Signed)
I spoke to pt and he expressed understanding about the prep and instruction. No further questions at this time

## 2021-08-19 NOTE — Telephone Encounter (Signed)
Left message on voicemail asking pt to return my call... msg sent via mychart with instructions for capsule study... on VM pt advised to not have any solid foods after 12pm today.

## 2021-08-20 ENCOUNTER — Encounter
Admission: RE | Disposition: A | Discharge status: Home / Self Care | Payer: Self-pay | Source: Home / Self Care | Attending: Gastroenterology

## 2021-08-20 ENCOUNTER — Encounter: Payer: Self-pay | Admitting: Anesthesiology

## 2021-08-20 ENCOUNTER — Ambulatory Visit
Admission: RE | Admit: 2021-08-20 | Discharge: 2021-08-20 | Disposition: A | Discharge status: Home / Self Care | Payer: Medicare Other | Attending: Gastroenterology | Admitting: Gastroenterology

## 2021-08-20 DIAGNOSIS — D509 Iron deficiency anemia, unspecified: Secondary | ICD-10-CM | POA: Diagnosis not present

## 2021-08-20 HISTORY — PX: GIVENS CAPSULE STUDY: SHX5432

## 2021-08-20 SURGERY — IMAGING PROCEDURE, GI TRACT, INTRALUMINAL, VIA CAPSULE

## 2021-08-21 ENCOUNTER — Encounter: Payer: Self-pay | Admitting: Gastroenterology

## 2021-08-22 ENCOUNTER — Telehealth: Payer: Self-pay

## 2021-08-22 ENCOUNTER — Other Ambulatory Visit: Payer: Self-pay | Admitting: Oncology

## 2021-08-22 ENCOUNTER — Encounter: Payer: Self-pay | Admitting: Gastroenterology

## 2021-08-22 DIAGNOSIS — D500 Iron deficiency anemia secondary to blood loss (chronic): Secondary | ICD-10-CM

## 2021-08-22 NOTE — Telephone Encounter (Signed)
-----   Message from Earlie Server, MD sent at 08/22/2021  3:01 PM EST ----- Please schedule patient to get IV venofer weekly x 4, follow up in 4 months, iron labs prior, MD +/- Venofer

## 2021-08-22 NOTE — Telephone Encounter (Signed)
Please schedule pt as MD recommends and notify pt of appts.

## 2021-08-22 NOTE — Telephone Encounter (Signed)
Attempted to reach pt. Left VM to call back and schedule appts below.

## 2021-08-25 ENCOUNTER — Encounter: Payer: Self-pay | Admitting: Oncology

## 2021-08-25 ENCOUNTER — Telehealth: Payer: Self-pay | Admitting: Oncology

## 2021-08-25 NOTE — Telephone Encounter (Signed)
Spoke with patient and got him scheduled for Venofer x 4 as well as 4 month f/u.   Thanks! Danise Mina

## 2021-08-25 NOTE — Telephone Encounter (Signed)
Pt returning call. Please call back at [phone].

## 2021-08-25 NOTE — Telephone Encounter (Signed)
Spoke to pt and notified him of results. He states he will not be in [place] this week. Please schedule per infusion availability.

## 2021-08-25 NOTE — Telephone Encounter (Signed)
Pt informed via Mychart. Please schedule appts as requested by MD.

## 2021-09-01 ENCOUNTER — Inpatient Hospital Stay: Payer: Medicare Other

## 2021-09-02 ENCOUNTER — Inpatient Hospital Stay: Payer: Medicare Other

## 2021-09-02 ENCOUNTER — Other Ambulatory Visit: Payer: Self-pay

## 2021-09-02 ENCOUNTER — Other Ambulatory Visit: Payer: Self-pay | Admitting: Cardiovascular Disease

## 2021-09-02 VITALS — BP 111/74 | HR 86 | Temp 97.0°F | Resp 18 | Wt 211.6 lb

## 2021-09-02 DIAGNOSIS — D500 Iron deficiency anemia secondary to blood loss (chronic): Secondary | ICD-10-CM | POA: Diagnosis not present

## 2021-09-02 DIAGNOSIS — K319 Disease of stomach and duodenum, unspecified: Secondary | ICD-10-CM | POA: Diagnosis not present

## 2021-09-02 DIAGNOSIS — I5022 Chronic systolic (congestive) heart failure: Secondary | ICD-10-CM

## 2021-09-02 DIAGNOSIS — K2980 Duodenitis without bleeding: Secondary | ICD-10-CM | POA: Diagnosis not present

## 2021-09-02 DIAGNOSIS — E1142 Type 2 diabetes mellitus with diabetic polyneuropathy: Secondary | ICD-10-CM | POA: Diagnosis not present

## 2021-09-02 DIAGNOSIS — I110 Hypertensive heart disease with heart failure: Secondary | ICD-10-CM | POA: Diagnosis not present

## 2021-09-02 DIAGNOSIS — R634 Abnormal weight loss: Secondary | ICD-10-CM | POA: Diagnosis not present

## 2021-09-02 DIAGNOSIS — I5042 Chronic combined systolic (congestive) and diastolic (congestive) heart failure: Secondary | ICD-10-CM | POA: Diagnosis not present

## 2021-09-02 MED ORDER — IRON SUCROSE 20 MG/ML IV SOLN
200.0000 mg | Freq: Once | INTRAVENOUS | Status: AC
  Administered 2021-09-02: 200 mg via INTRAVENOUS

## 2021-09-02 MED ORDER — SODIUM CHLORIDE 0.9 % IV SOLN: 200.0000 mg | Freq: Once | INTRAVENOUS | Status: DC

## 2021-09-02 MED ORDER — SODIUM CHLORIDE 0.9 % IV SOLN
Freq: Once | INTRAVENOUS | Status: AC
  Filled 2021-09-02: qty 250

## 2021-09-03 DIAGNOSIS — R059 Cough, unspecified: Secondary | ICD-10-CM | POA: Diagnosis not present

## 2021-09-03 DIAGNOSIS — R0609 Other forms of dyspnea: Secondary | ICD-10-CM | POA: Diagnosis not present

## 2021-09-09 ENCOUNTER — Other Ambulatory Visit: Payer: Self-pay

## 2021-09-09 ENCOUNTER — Ambulatory Visit
Admission: RE | Admit: 2021-09-09 | Discharge: 2021-09-09 | Disposition: A | Discharge status: Home / Self Care | Payer: Medicare Other | Source: Ambulatory Visit | Attending: Anesthesiology | Admitting: Anesthesiology

## 2021-09-09 ENCOUNTER — Ambulatory Visit (HOSPITAL_BASED_OUTPATIENT_CLINIC_OR_DEPARTMENT_OTHER): Payer: Medicare Other | Admitting: Anesthesiology

## 2021-09-09 ENCOUNTER — Other Ambulatory Visit: Payer: Self-pay | Admitting: Anesthesiology

## 2021-09-09 ENCOUNTER — Encounter: Payer: Self-pay | Admitting: Anesthesiology

## 2021-09-09 VITALS — BP 100/76 | HR 60 | Temp 96.8°F | Resp 16 | Ht 67.0 in | Wt 210.0 lb

## 2021-09-09 DIAGNOSIS — F1190 Opioid use, unspecified, uncomplicated: Secondary | ICD-10-CM

## 2021-09-09 DIAGNOSIS — R29898 Other symptoms and signs involving the musculoskeletal system: Secondary | ICD-10-CM

## 2021-09-09 DIAGNOSIS — A6923 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: Secondary | ICD-10-CM | POA: Insufficient documentation

## 2021-09-09 DIAGNOSIS — M5432 Sciatica, left side: Secondary | ICD-10-CM | POA: Diagnosis not present

## 2021-09-09 DIAGNOSIS — R52 Pain, unspecified: Secondary | ICD-10-CM

## 2021-09-09 DIAGNOSIS — M4716 Other spondylosis with myelopathy, lumbar region: Secondary | ICD-10-CM | POA: Diagnosis not present

## 2021-09-09 DIAGNOSIS — G894 Chronic pain syndrome: Secondary | ICD-10-CM

## 2021-09-09 DIAGNOSIS — M5136 Other intervertebral disc degeneration, lumbar region: Secondary | ICD-10-CM | POA: Diagnosis not present

## 2021-09-09 DIAGNOSIS — M545 Low back pain, unspecified: Secondary | ICD-10-CM

## 2021-09-09 DIAGNOSIS — M542 Cervicalgia: Secondary | ICD-10-CM | POA: Insufficient documentation

## 2021-09-09 MED ORDER — TRIAMCINOLONE ACETONIDE 40 MG/ML IJ SUSP
40.0000 mg | Freq: Once | INTRAMUSCULAR | Status: AC
  Administered 2021-09-09: 40 mg

## 2021-09-09 MED ORDER — LIDOCAINE HCL (PF) 1 % IJ SOLN
INTRAMUSCULAR | Status: AC
  Filled 2021-09-09: qty 5

## 2021-09-09 MED ORDER — ROPIVACAINE HCL 2 MG/ML IJ SOLN
10.0000 mL | Freq: Once | INTRAMUSCULAR | Status: AC
  Administered 2021-09-09: 1 mL via EPIDURAL

## 2021-09-09 MED ORDER — ROPIVACAINE HCL 2 MG/ML IJ SOLN
INTRAMUSCULAR | Status: AC
  Filled 2021-09-09: qty 20

## 2021-09-09 MED ORDER — OXYCODONE HCL 10 MG PO TABS: 10.0000 mg | ORAL_TABLET | Freq: Three times a day (TID) | ORAL | 0 refills | Status: DC

## 2021-09-09 MED ORDER — IOHEXOL 180 MG/ML  SOLN
INTRAMUSCULAR | Status: AC
  Filled 2021-09-09: qty 10

## 2021-09-09 MED ORDER — CYCLOBENZAPRINE HCL 10 MG PO TABS: 10.0000 mg | ORAL_TABLET | Freq: Two times a day (BID) | ORAL | 5 refills | Status: DC | PRN

## 2021-09-09 MED ORDER — LIDOCAINE HCL (PF) 1 % IJ SOLN
5.0000 mL | Freq: Once | INTRAMUSCULAR | Status: AC
  Administered 2021-09-09: 5 mL via SUBCUTANEOUS

## 2021-09-09 MED ORDER — SODIUM CHLORIDE (PF) 0.9 % IJ SOLN
INTRAMUSCULAR | Status: AC
  Filled 2021-09-09: qty 10

## 2021-09-09 MED ORDER — OXYCODONE HCL 10 MG PO TABS: 10.0000 mg | ORAL_TABLET | Freq: Three times a day (TID) | ORAL | 0 refills | Status: AC

## 2021-09-09 MED ORDER — IOHEXOL 180 MG/ML  SOLN
10.0000 mL | Freq: Once | INTRAMUSCULAR | Status: AC | PRN
  Administered 2021-09-09: 10 mL via EPIDURAL

## 2021-09-09 MED ORDER — TRIAMCINOLONE ACETONIDE 40 MG/ML IJ SUSP
INTRAMUSCULAR | Status: AC
  Filled 2021-09-09: qty 1

## 2021-09-09 MED ORDER — SODIUM CHLORIDE 0.9% FLUSH
10.0000 mL | Freq: Once | INTRAVENOUS | Status: AC
  Administered 2021-09-09: 10 mL

## 2021-09-09 NOTE — Patient Instructions (Signed)
Pain Management Discharge Instructions  General Discharge Instructions :  If you need to reach your doctor call: Monday-Friday 8:00 am - 4:00 pm at [phone] or toll free [phone].  After clinic hours [phone] to have operator reach doctor.  Bring all of your medication bottles to all your appointments in the pain clinic.  To cancel or reschedule your appointment with Pain Management please remember to call 24 hours in advance to avoid a fee.  Refer to the educational materials which you have been given on: General Risks, I had my Procedure. Discharge Instructions, Post Sedation.  Post Procedure Instructions:  The drugs you were given will stay in your system until tomorrow, so for the next 24 hours you should not drive, make any legal decisions or drink any alcoholic beverages.  You may eat anything you prefer, but it is better to start with liquids then soups and crackers, and gradually work up to solid foods.  Please notify your doctor immediately if you have any unusual bleeding, trouble breathing or pain that is not related to your normal pain.  Depending on the type of procedure that was done, some parts of your body may feel week and/or numb.  This usually clears up by tonight or the next day.  Walk with the use of an assistive device or accompanied by an adult for the 24 hours.  You may use ice on the affected area for the first 24 hours.  Put ice in a Ziploc bag and cover with a towel and place against area 15 minutes on 15 minutes off.  You may switch to heat after 24 hours.Epidural Steroid Injection Patient Information  Description: The epidural space surrounds the nerves as they exit the spinal cord.  In some patients, the nerves can be compressed and inflamed by a bulging disc or a tight spinal canal (spinal stenosis).  By injecting steroids into the epidural space, we can bring irritated nerves into direct contact with a potentially helpful medication.  These  steroids act directly on the irritated nerves and can reduce swelling and inflammation which often leads to decreased pain.  Epidural steroids may be injected anywhere along the spine and from the neck to the low back depending upon the location of your pain.   After numbing the skin with local anesthetic (like Novocaine), a small needle is passed into the epidural space slowly.  You may experience a sensation of pressure while this is being done.  The entire block usually last less than 10 minutes.  Conditions which may be treated by epidural steroids:  Low back and leg pain Neck and arm pain Spinal stenosis Post-laminectomy syndrome Herpes zoster (shingles) pain Pain from compression fractures  Preparation for the injection:  Do not eat any solid food or dairy products within 8 hours of your appointment.  You may drink clear liquids up to 3 hours before appointment.  Clear liquids include water, black coffee, juice or soda.  No milk or cream please. You may take your regular medication, including pain medications, with a sip of water before your appointment  Diabetics should hold regular insulin (if taken separately) and take 1/2 normal NPH dos the morning of the procedure.  Carry some sugar containing items with you to your appointment. A driver must accompany you and be prepared to drive you home after your procedure.  Bring all your current medications with your. An IV may be inserted and sedation may be given at the discretion of the physician.     A blood pressure cuff, EKG and other monitors will often be applied during the procedure.  Some patients may need to have extra oxygen administered for a short period. You will be asked to provide medical information, including your allergies, prior to the procedure.  We must know immediately if you are taking blood thinners (like Coumadin/Warfarin)  Or if you are allergic to IV iodine contrast (dye). We must know if you could possible be  pregnant.  Possible side-effects: Bleeding from needle site Infection (rare, may require surgery) Nerve injury (rare) Numbness & tingling (temporary) Difficulty urinating (rare, temporary) Spinal headache ( a headache worse with upright posture) Light -headedness (temporary) Pain at injection site (several days) Decreased blood pressure (temporary) Weakness in arm/leg (temporary) Pressure sensation in back/neck (temporary)  Call if you experience: Fever/chills associated with headache or increased back/neck pain. Headache worsened by an upright position. New onset weakness or numbness of an extremity below the injection site Hives or difficulty breathing (go to the emergency room) Inflammation or drainage at the infection site Severe back/neck pain Any new symptoms which are concerning to you  Please note:  Although the local anesthetic injected can often make your back or neck feel good for several hours after the injection, the pain will likely return.  It takes 3-7 days for steroids to work in the epidural space.  You may not notice any pain relief for at least that one week.  If effective, we will often do a series of three injections spaced 3-6 weeks apart to maximally decrease your pain.  After the initial series, we generally will wait several months before considering a repeat injection of the same type.  If you have any questions, please call [phone] [facility] 

## 2021-09-09 NOTE — Progress Notes (Signed)
Subjective:  Patient ID: [name], male    DOB: Aug 27, 1961  Age: 60 y.o. MRN: [id number]  CC: Back Pain   Procedure: L5-S1 epidural steroid under fluoroscopic guidance with no sedation  HPI [name] presents for reevaluation.  He was last seen few months ago and continues to have low back pain with left lower extremity sciatica hip and knee pain.  He presents periodically for repeat epidural injections to keep his pain under reasonable control in addition to the chronic oxycodone he takes for pain management.  He has diffuse lower extremity pain and joint pain and has done well with this regimen.  He generally gets 75 to 80% pain relief from the epidural lasting about 2 months before he has recurrence of the same baseline pain.  He has gone through physical therapy and other more conservative modalities with limited success and generally functions well with the combination of the epidural steroids and opioid medications.  Otherwise no change in the quality characteristic or distribution of his low back pain and leg pain are noted.  No change in strength but he has chronic left lower extremity weakness from myasthenia.  No change in bowel or bladder function is noted.  Outpatient Medications Prior to Visit  Medication Sig Dispense Refill   albuterol (PROVENTIL HFA;VENTOLIN HFA) 108 (90 Base) MCG/ACT inhaler Inhale 2 puffs into the lungs every 6 (six) hours as needed for wheezing or shortness of breath.      albuterol (PROVENTIL) (2.5 MG/3ML) 0.083% nebulizer solution Take 2.5 mg by nebulization every 6 (six) hours as needed for wheezing or shortness of breath.      aspirin EC 81 MG tablet Take 1 tablet (81 mg total) by mouth daily. 90 tablet 3   clopidogrel (PLAVIX) 75 MG tablet TAKE 1 TABLET(75 MG) BY MOUTH DAILY WITH BREAKFAST 30 tablet 6   dexlansoprazole (DEXILANT) 60 MG capsule Take 1 capsule (60 mg total) by mouth daily. 30 capsule 11   DULoxetine (CYMBALTA) 30 MG capsule  Take 1 capsule (30 mg total) by mouth daily. 90 capsule 1   DULoxetine (CYMBALTA) 60 MG capsule Take 1 capsule (60 mg total) by mouth daily. 90 capsule 1   ezetimibe (ZETIA) 10 MG tablet TAKE 1 TABLET(10 MG) BY MOUTH DAILY 90 tablet 3   Fluticasone-Umeclidin-Vilant (TRELEGY ELLIPTA) 100-62.5-25 MCG/INH AEPB Inhale 1 each into the lungs daily. In place of breo 60 each 2   furosemide (LASIX) 20 MG tablet TAKE 1 TABLET BY MOUTH DAILY AS NEEDED FOR WEIGHT GAIN OF 2 POUNDS OVERNIGHT OR 5 POUNDS PER 1 WEEK 90 tablet 0   gabapentin (NEURONTIN) 300 MG capsule Take 1 capsule (300 mg total) by mouth 3 (three) times daily. 90 capsule 1   gabapentin (NEURONTIN) 800 MG tablet Take 800 mg by mouth at bedtime.     insulin lispro (HUMALOG) 100 UNIT/ML KiwkPen Inject 7-8 Units into the skin 3 (three) times daily. Reported on 12/02/2015/ sliding scale 1 unit for every 7 units of carbs; and 1 unit for every 20 above 120     Insulin Pen Needle 32G X 4 MM MISC Inject 1 each into the skin as needed.     JARDIANCE 10 MG TABS tablet TAKE 1 TABLET(10 MG) BY MOUTH DAILY BEFORE BREAKFAST 30 tablet 4   LANTUS SOLOSTAR 100 UNIT/ML Solostar Pen Inject 23-29 Units into the skin daily at 10 pm.  0   levocetirizine (XYZAL) 5 MG tablet TAKE 1 TABLET(5 MG) BY MOUTH EVERY  EVENING 30 tablet 3   levothyroxine (SYNTHROID) 50 MCG tablet TAKE 1 TABLET BY MOUTH DAILY BEFORE BREAKFAST AND 2 TABLETS ON SUNDAY. RECHECK LEVEL IN 6 WEEKS 32 tablet 0   loratadine (CLARITIN) 10 MG tablet Take 10 mg by mouth daily.      metFORMIN (GLUCOPHAGE) 1000 MG tablet Take 1,000 mg by mouth 2 (two) times daily with a meal.     metoprolol succinate (TOPROL-XL) 100 MG 24 hr tablet TAKE 1 TABLET(100 MG) BY MOUTH DAILY WITH OR IMMEDIATELY FOLLOWING A MEAL 90 tablet 2   montelukast (SINGULAIR) 10 MG tablet Take 1 tablet by mouth at bedtime.     Multiple Vitamins-Minerals (MENS MULTI VITAMIN & MINERAL PO) Take 1 tablet by mouth in the morning and at bedtime.      naloxone (NARCAN) nasal spray 4 mg/0.1 mL For excess sedation from opioids 1 kit 2   nitroGLYCERIN (NITROSTAT) 0.3 MG SL tablet Place 1 tablet (0.3 mg total) under the tongue every 5 (five) minutes as needed for chest pain. Maximum 3 doses. 25 tablet 3   OZEMPIC, 1 MG/DOSE, 4 MG/3ML SOPN Inject 1 mg into the skin once a week.     rosuvastatin (CRESTOR) 5 MG tablet Take 5 mg by mouth daily.  0   tamsulosin (FLOMAX) 0.4 MG CAPS capsule TAKE 2 CAPSULES BY MOUTH DAILY 60 capsule 2   cyclobenzaprine (FLEXERIL) 10 MG tablet Take 10 mg by mouth 2 (two) times daily as needed.     No facility-administered medications prior to visit.    Review of Systems CNS: No confusion or sedation Cardiac: No angina or palpitations GI: No abdominal pain or constipation Constitutional: No nausea vomiting fevers or chills  Objective:  BP 100/76   Pulse 60   Temp (!) 96.8 F (36 C)   Resp 16   Ht _0  (1.702 m)   Wt 210 lb (95.3 kg)   SpO2 95%   BMI 32.89 kg/m    BP Readings from Last 3 Encounters:  09/09/21 100/76  09/02/21 111/74  08/13/21 106/75     Wt Readings from Last 3 Encounters:  09/09/21 210 lb (95.3 kg)  09/02/21 211 lb 10.3 oz (96 kg)  08/13/21 216 lb (98 kg)     Physical Exam Pt is alert and oriented PERRL EOMI HEART IS RRR no murmur or rub LCTA no wheezing or rales MUSCULOSKELETAL reveals some paraspinous muscle tenderness but no overt trigger points.  He has lost a slight amount of muscle tone and bulk secondary to efforts at weight loss and he has lost reported 40 pounds.  This is reportedly intentional.  Labs  Lab Results  Component Value Date   HGBA1C 8.3 06/04/2021   HGBA1C 7.9 01/29/2021   HGBA1C 8.4 (A) 09/16/2020   Lab Results  Component Value Date   MICROALBUR 1.4 05/05/2019   LDLCALC 39 07/17/2021   CREATININE 1.26 07/17/2021    -------------------------------------------------------------------------------------------------------------------- Lab  Results  Component Value Date   WBC 7.0 08/13/2021   HGB 11.9 (L) 08/13/2021   HCT 38.0 (L) 08/13/2021   PLT 210 08/13/2021   GLUCOSE 122 (H) 07/17/2021   CHOL 92 (L) 07/17/2021   TRIG 145 07/17/2021   HDL 28 (L) 07/17/2021   LDLDIRECT 36 07/17/2021   LDLCALC 39 07/17/2021   ALT 25 07/17/2021   AST 37 07/17/2021   NA 141 07/17/2021   K 5.0 07/17/2021   CL 105 07/17/2021   CREATININE 1.26 07/17/2021   BUN 21 07/17/2021  CO2 20 07/17/2021   TSH 1.41 04/02/2021   PSA 0.7 05/05/2019   INR 1.26 07/07/2018   HGBA1C 8.3 06/04/2021   MICROALBUR 1.4 05/05/2019    --------------------------------------------------------------------------------------------------------------------- DG PAIN CLINIC C-ARM 1-60 MIN NO REPORT  Result Date: 09/09/2021 Fluoro was used, but no Radiologist interpretation will be provided. Please refer to "NOTES" tab for provider progress note.    Assessment & Plan:   [name] was seen today for back pain.  Diagnoses and all orders for this visit:  Lumbar spondylosis with myelopathy -     Lumbar Epidural Injection; Future  Left leg weakness -     Lumbar Epidural Injection  DDD (degenerative disc disease), lumbar -     Lumbar Epidural Injection -     Lumbar Epidural Injection; Future  Sciatica, left side -     Lumbar Epidural Injection -     Lumbar Epidural Injection; Future  Low back pain at multiple sites -     Lumbar Epidural Injection  Chronic, continuous use of opioids  Chronic pain syndrome  Cervicalgia  Lyme arthritis of multiple joints (HCC)  Other orders -     cyclobenzaprine (FLEXERIL) 10 MG tablet; Take 1 tablet (10 mg total) by mouth 2 (two) times daily as needed. -     Oxycodone HCl 10 MG TABS; Take 1 tablet (10 mg total) by mouth every 8 (eight) hours. -     Oxycodone HCl 10 MG TABS; Take 1 tablet (10 mg total) by mouth every 8 (eight) hours. -     triamcinolone acetonide (KENALOG-40) injection 40 mg -     sodium chloride  flush (NS) 0.9 % injection 10 mL -     ropivacaine (PF) 2 mg/mL (0.2%) (NAROPIN) injection 10 mL -     lidocaine (PF) (XYLOCAINE) 1 % injection 5 mL -     iohexol (OMNIPAQUE) 180 MG/ML injection 10 mL        ----------------------------------------------------------------------------------------------------------------------  Problem List Items Addressed This Visit       Unprioritized   Left leg weakness   Other Visit Diagnoses     Lumbar spondylosis with myelopathy    -  Primary   Relevant Medications   cyclobenzaprine (FLEXERIL) 10 MG tablet   Oxycodone HCl 10 MG TABS   Oxycodone HCl 10 MG TABS (Start on 10/09/2021)   triamcinolone acetonide (KENALOG-40) injection 40 mg   Other Relevant Orders   Lumbar Epidural Injection   DDD (degenerative disc disease), lumbar       Relevant Medications   cyclobenzaprine (FLEXERIL) 10 MG tablet   Oxycodone HCl 10 MG TABS   Oxycodone HCl 10 MG TABS (Start on 10/09/2021)   triamcinolone acetonide (KENALOG-40) injection 40 mg   Other Relevant Orders   Lumbar Epidural Injection   Sciatica, left side       Relevant Medications   cyclobenzaprine (FLEXERIL) 10 MG tablet   Other Relevant Orders   Lumbar Epidural Injection   Low back pain at multiple sites       Relevant Medications   cyclobenzaprine (FLEXERIL) 10 MG tablet   Oxycodone HCl 10 MG TABS   Oxycodone HCl 10 MG TABS (Start on 10/09/2021)   triamcinolone acetonide (KENALOG-40) injection 40 mg   Chronic, continuous use of opioids       Chronic pain syndrome       Relevant Medications   cyclobenzaprine (FLEXERIL) 10 MG tablet   Oxycodone HCl 10 MG TABS   Oxycodone HCl 10 MG TABS (Start on  10/09/2021)   triamcinolone acetonide (KENALOG-40) injection 40 mg   ropivacaine (PF) 2 mg/mL (0.2%) (NAROPIN) injection 10 mL   lidocaine (PF) (XYLOCAINE) 1 % injection 5 mL   Cervicalgia       Lyme arthritis of multiple joints (HCC)       Relevant Medications   cyclobenzaprine (FLEXERIL) 10 MG  tablet   Oxycodone HCl 10 MG TABS   Oxycodone HCl 10 MG TABS (Start on 10/09/2021)   triamcinolone acetonide (KENALOG-40) injection 40 mg         ----------------------------------------------------------------------------------------------------------------------  1. Left leg weakness  - Lumbar Epidural Injection  2. DDD (degenerative disc disease), lumbar Repeat epidural today.  We gone over the risks and benefits of the procedure with him in full detail all questions are answered. Return to clinic in 2 months for repeat epidural at that time.  Continue efforts at stretching strengthening as reviewed - Lumbar Epidural Injection - Lumbar Epidural Injection; Future  3. Sciatica, left side As above with efforts at core stretching strengthening - Lumbar Epidural Injection - Lumbar Epidural Injection; Future  4. Low back pain at multiple sites  - Lumbar Epidural Injection  5. Lumbar spondylosis with myelopathy  - Lumbar Epidural Injection; Future  6. Chronic, continuous use of opioids I have reviewed the [facility] practitioner database information and it is appropriate for refills dated for December 6 and January 5.  He continues to do well with chronic opioid management with no side effects or illicit or diverting use.  7. Chronic pain syndrome As above  8. Cervicalgia As above  9. Lyme arthritis of multiple joints ([place]) As above and continue follow-up with his primary care physicians.  I cautioned him against any further significant weight loss at this point and have encouraged him to follow-up with his primary care physicians for ongoing primary care evaluation.    ----------------------------------------------------------------------------------------------------------------------  I have changed Kris Hartmann. Munshi "[name]"'s cyclobenzaprine. I am also having him start on Oxycodone HCl and Oxycodone HCl. Additionally, I am having him maintain his insulin lispro,  loratadine, albuterol, gabapentin, metFORMIN, albuterol, Lantus SoloStar, naloxone, rosuvastatin, Insulin Pen Needle, aspirin EC, nitroGLYCERIN, gabapentin, Multiple Vitamins-Minerals (MENS MULTI VITAMIN & MINERAL PO), Trelegy Ellipta, tamsulosin, levocetirizine, metoprolol succinate, dexlansoprazole, montelukast, ezetimibe, furosemide, Ozempic (1 MG/DOSE), clopidogrel, levothyroxine, DULoxetine, DULoxetine, and Jardiance.   Meds ordered this encounter  Medications   cyclobenzaprine (FLEXERIL) 10 MG tablet    Sig: Take 1 tablet (10 mg total) by mouth 2 (two) times daily as needed.    Dispense:  30 tablet    Refill:  5   Oxycodone HCl 10 MG TABS    Sig: Take 1 tablet (10 mg total) by mouth every 8 (eight) hours.    Dispense:  90 tablet    Refill:  0   Oxycodone HCl 10 MG TABS    Sig: Take 1 tablet (10 mg total) by mouth every 8 (eight) hours.    Dispense:  90 tablet    Refill:  0   triamcinolone acetonide (KENALOG-40) injection 40 mg   sodium chloride flush (NS) 0.9 % injection 10 mL   ropivacaine (PF) 2 mg/mL (0.2%) (NAROPIN) injection 10 mL   lidocaine (PF) (XYLOCAINE) 1 % injection 5 mL   iohexol (OMNIPAQUE) 180 MG/ML injection 10 mL   Patient's Medications  New Prescriptions   OXYCODONE HCL 10 MG TABS    Take 1 tablet (10 mg total) by mouth every 8 (eight) hours.   OXYCODONE HCL 10 MG TABS  Take 1 tablet (10 mg total) by mouth every 8 (eight) hours.  Previous Medications   ALBUTEROL (PROVENTIL HFA;VENTOLIN HFA) 108 (90 BASE) MCG/ACT INHALER    Inhale 2 puffs into the lungs every 6 (six) hours as needed for wheezing or shortness of breath.    ALBUTEROL (PROVENTIL) (2.5 MG/3ML) 0.083% NEBULIZER SOLUTION    Take 2.5 mg by nebulization every 6 (six) hours as needed for wheezing or shortness of breath.    ASPIRIN EC 81 MG TABLET    Take 1 tablet (81 mg total) by mouth daily.   CLOPIDOGREL (PLAVIX) 75 MG TABLET    TAKE 1 TABLET(75 MG) BY MOUTH DAILY WITH BREAKFAST   DEXLANSOPRAZOLE  (DEXILANT) 60 MG CAPSULE    Take 1 capsule (60 mg total) by mouth daily.   DULOXETINE (CYMBALTA) 30 MG CAPSULE    Take 1 capsule (30 mg total) by mouth daily.   DULOXETINE (CYMBALTA) 60 MG CAPSULE    Take 1 capsule (60 mg total) by mouth daily.   EZETIMIBE (ZETIA) 10 MG TABLET    TAKE 1 TABLET(10 MG) BY MOUTH DAILY   FLUTICASONE-UMECLIDIN-VILANT (TRELEGY ELLIPTA) 100-62.5-25 MCG/INH AEPB    Inhale 1 each into the lungs daily. In place of breo   FUROSEMIDE (LASIX) 20 MG TABLET    TAKE 1 TABLET BY MOUTH DAILY AS NEEDED FOR WEIGHT GAIN OF 2 POUNDS OVERNIGHT OR 5 POUNDS PER 1 WEEK   GABAPENTIN (NEURONTIN) 300 MG CAPSULE    Take 1 capsule (300 mg total) by mouth 3 (three) times daily.   GABAPENTIN (NEURONTIN) 800 MG TABLET    Take 800 mg by mouth at bedtime.   INSULIN LISPRO (HUMALOG) 100 UNIT/ML KIWKPEN    Inject 7-8 Units into the skin 3 (three) times daily. Reported on 12/02/2015/ sliding scale 1 unit for every 7 units of carbs; and 1 unit for every 20 above 120   INSULIN PEN NEEDLE 32G X 4 MM MISC    Inject 1 each into the skin as needed.   JARDIANCE 10 MG TABS TABLET    TAKE 1 TABLET(10 MG) BY MOUTH DAILY BEFORE BREAKFAST   LANTUS SOLOSTAR 100 UNIT/ML SOLOSTAR PEN    Inject 23-29 Units into the skin daily at 10 pm.   LEVOCETIRIZINE (XYZAL) 5 MG TABLET    TAKE 1 TABLET(5 MG) BY MOUTH EVERY EVENING   LEVOTHYROXINE (SYNTHROID) 50 MCG TABLET    TAKE 1 TABLET BY MOUTH DAILY BEFORE BREAKFAST AND 2 TABLETS ON SUNDAY. RECHECK LEVEL IN 6 WEEKS   LORATADINE (CLARITIN) 10 MG TABLET    Take 10 mg by mouth daily.    METFORMIN (GLUCOPHAGE) 1000 MG TABLET    Take 1,000 mg by mouth 2 (two) times daily with a meal.   METOPROLOL SUCCINATE (TOPROL-XL) 100 MG 24 HR TABLET    TAKE 1 TABLET(100 MG) BY MOUTH DAILY WITH OR IMMEDIATELY FOLLOWING A MEAL   MONTELUKAST (SINGULAIR) 10 MG TABLET    Take 1 tablet by mouth at bedtime.   MULTIPLE VITAMINS-MINERALS (MENS MULTI VITAMIN & MINERAL PO)    Take 1 tablet by mouth in the  morning and at bedtime.   NALOXONE (NARCAN) NASAL SPRAY 4 MG/0.1 ML    For excess sedation from opioids   NITROGLYCERIN (NITROSTAT) 0.3 MG SL TABLET    Place 1 tablet (0.3 mg total) under the tongue every 5 (five) minutes as needed for chest pain. Maximum 3 doses.   OZEMPIC, 1 MG/DOSE, 4 MG/3ML SOPN    Inject  1 mg into the skin once a week.   ROSUVASTATIN (CRESTOR) 5 MG TABLET    Take 5 mg by mouth daily.   TAMSULOSIN (FLOMAX) 0.4 MG CAPS CAPSULE    TAKE 2 CAPSULES BY MOUTH DAILY  Modified Medications   Modified Medication Previous Medication   CYCLOBENZAPRINE (FLEXERIL) 10 MG TABLET cyclobenzaprine (FLEXERIL) 10 MG tablet      Take 1 tablet (10 mg total) by mouth 2 (two) times daily as needed.    Take 10 mg by mouth 2 (two) times daily as needed.  Discontinued Medications   No medications on file   ----------------------------------------------------------------------------------------------------------------------  Follow-up: Return in about 2 months (around 11/10/2021) for evaluation, procedure.   Procedure: L5-S1 LESI with fluoroscopic guidance and no moderate sedation  NOTE: The risks, benefits, and expectations of the procedure have been discussed and explained to the patient who was understanding and in agreement with suggested treatment plan. No guarantees were made.  DESCRIPTION OF PROCEDURE: Lumbar epidural steroid injection with no IV Versed, EKG, blood pressure, pulse, and pulse oximetry monitoring. The procedure was performed with the patient in the prone position under fluoroscopic guidance.  Sterile prep x3 was initiated and I then injected subcutaneous lidocaine to the overlying L5-S1 site after its fluoroscopic identifictation.  Using strict aseptic technique, I then advanced an 18-gauge Tuohy epidural needle in the midline using interlaminar approach via loss-of-resistance to saline technique. There was negative aspiration for heme or  CSF.  I then confirmed position with both AP  and Lateral fluoroscan.  2 cc of contrast dye were injected and a  total of 5 mL of Preservative-Free normal saline mixed with 40 mg of Kenalog and 1cc Ropicaine 0.2 percent were injected incrementally via the  epidurally placed needle. The needle was removed. The patient tolerated the injection well and was convalesced and discharged to home in stable condition. Should the patient have any post procedure difficulty they have been instructed on how to contact us for assistance.    Molli Barrows, MD

## 2021-09-09 NOTE — Progress Notes (Signed)
Nursing Pain Medication Assessment:  Safety precautions to be maintained throughout the outpatient stay will include: orient to surroundings, keep bed in low position, maintain call bell within reach at all times, provide assistance with transfer out of bed and ambulation.  Medication Inspection Compliance: Pill count conducted under aseptic conditions, in front of the patient. Neither the pills nor the bottle was removed from the patient's sight at any time. Once count was completed pills were immediately returned to the patient in their original bottle.  Medication: Oxycodone IR Pill/Patch Count:  2 of 120 pills remain Pill/Patch Appearance: Markings consistent with prescribed medication Bottle Appearance: Standard pharmacy container. Clearly labeled. Filled Date: 82 / 2 / 2022 Last Medication intake:  Today

## 2021-09-10 ENCOUNTER — Telehealth: Payer: Self-pay | Admitting: *Deleted

## 2021-09-10 NOTE — Telephone Encounter (Signed)
Called for post procedure check. No answer. LVM. 

## 2021-09-11 ENCOUNTER — Inpatient Hospital Stay: Payer: Medicare Other | Attending: Oncology

## 2021-09-11 ENCOUNTER — Other Ambulatory Visit: Payer: Self-pay

## 2021-09-11 VITALS — BP 125/72 | HR 95 | Temp 97.4°F | Resp 16

## 2021-09-11 DIAGNOSIS — D500 Iron deficiency anemia secondary to blood loss (chronic): Secondary | ICD-10-CM | POA: Diagnosis not present

## 2021-09-11 MED ORDER — SODIUM CHLORIDE 0.9 % IV SOLN: 200.0000 mg | Freq: Once | INTRAVENOUS | Status: DC

## 2021-09-11 MED ORDER — IRON SUCROSE 20 MG/ML IV SOLN
200.0000 mg | Freq: Once | INTRAVENOUS | Status: AC
  Administered 2021-09-11: 200 mg via INTRAVENOUS
  Filled 2021-09-11: qty 10

## 2021-09-11 MED ORDER — SODIUM CHLORIDE 0.9 % IV SOLN
Freq: Once | INTRAVENOUS | Status: AC
  Filled 2021-09-11: qty 250

## 2021-09-11 NOTE — Patient Instructions (Signed)

## 2021-09-15 ENCOUNTER — Telehealth: Payer: Self-pay | Admitting: Gastroenterology

## 2021-09-15 DIAGNOSIS — Z794 Long term (current) use of insulin: Secondary | ICD-10-CM | POA: Diagnosis not present

## 2021-09-15 DIAGNOSIS — E1165 Type 2 diabetes mellitus with hyperglycemia: Secondary | ICD-10-CM | POA: Diagnosis not present

## 2021-09-15 NOTE — Telephone Encounter (Signed)
Inbound call from pt requesting a call back stating that he is waiting for his results from his capsule study.

## 2021-09-16 ENCOUNTER — Other Ambulatory Visit: Payer: Self-pay

## 2021-09-16 DIAGNOSIS — D500 Iron deficiency anemia secondary to blood loss (chronic): Secondary | ICD-10-CM

## 2021-09-16 NOTE — Telephone Encounter (Signed)
Returned pt's call regarding capsule study.   Per Dr. Marius Ditch:   Incomplete study Capsule retained in stomach until last image of the capsule study at 8hr:34 min  Repeat capsule study with endoscopic placement of capsule in 58months if patient not responding to iron replacement therapy.  Please get a KUB to make sure its gone  Pt notified of this information and KUB has been ordered.

## 2021-09-17 ENCOUNTER — Ambulatory Visit
Admission: RE | Admit: 2021-09-17 | Discharge: 2021-09-17 | Disposition: A | Discharge status: Home / Self Care | Payer: Medicare Other | Attending: Gastroenterology | Admitting: Gastroenterology

## 2021-09-17 ENCOUNTER — Other Ambulatory Visit: Payer: Self-pay

## 2021-09-17 ENCOUNTER — Other Ambulatory Visit: Payer: Medicare Other

## 2021-09-17 ENCOUNTER — Ambulatory Visit
Admission: RE | Admit: 2021-09-17 | Discharge: 2021-09-17 | Disposition: A | Discharge status: Home / Self Care | Payer: Medicare Other | Source: Ambulatory Visit | Attending: Gastroenterology | Admitting: Gastroenterology

## 2021-09-17 DIAGNOSIS — D500 Iron deficiency anemia secondary to blood loss (chronic): Secondary | ICD-10-CM | POA: Insufficient documentation

## 2021-09-17 DIAGNOSIS — K5900 Constipation, unspecified: Secondary | ICD-10-CM | POA: Diagnosis not present

## 2021-09-18 ENCOUNTER — Other Ambulatory Visit: Payer: Self-pay

## 2021-09-18 ENCOUNTER — Inpatient Hospital Stay: Payer: Medicare Other

## 2021-09-18 VITALS — BP 136/80 | HR 92 | Temp 96.0°F | Resp 20

## 2021-09-18 DIAGNOSIS — D500 Iron deficiency anemia secondary to blood loss (chronic): Secondary | ICD-10-CM | POA: Diagnosis not present

## 2021-09-18 MED ORDER — IRON SUCROSE 20 MG/ML IV SOLN
200.0000 mg | Freq: Once | INTRAVENOUS | Status: AC
  Administered 2021-09-18: 200 mg via INTRAVENOUS
  Filled 2021-09-18: qty 10

## 2021-09-18 MED ORDER — SODIUM CHLORIDE 0.9 % IV SOLN: 200.0000 mg | Freq: Once | INTRAVENOUS | Status: DC

## 2021-09-18 MED ORDER — SODIUM CHLORIDE 0.9 % IV SOLN
Freq: Once | INTRAVENOUS | Status: AC
  Filled 2021-09-18: qty 250

## 2021-09-18 NOTE — Patient Instructions (Signed)

## 2021-09-19 ENCOUNTER — Telehealth: Payer: Self-pay

## 2021-09-19 ENCOUNTER — Encounter: Payer: Self-pay | Admitting: Family Medicine

## 2021-09-19 NOTE — Telephone Encounter (Signed)
Pt is scheduled with Dr Rosana Berger for 09/23/21     Copied from [place] [phone]. Topic: Appointment Scheduling - Scheduling Inquiry for Clinic >> Sep 19, 2021 11:51 AM Fields, Museum/gallery conservator R wrote: Reason for CRM: pt wife calling in stating that pt has lost 65 lbs in 6 months. Claims that he weighed 208 yesterday 12/15 and weighs 200 today, she wants Dr. Ancil Boozer to call him, she says that she calls him all the time, I tried to schedule a telephone visit and it's not one available until 1/10 she declined and I tried to get a nurse to call her back and she declined that as well, she's demanding to speak to Dr. Ancil Boozer at [phone]

## 2021-09-19 NOTE — Progress Notes (Signed)
Chronic Care Management Pharmacy Assistant   Name: [name]  MRN: [id number] DOB: [birth year]  Reason for Encounter:Diabetes Disease State Call.   Recent office visits:  08/01/2021 Dr. Ancil Boozer MD (PCP)Start Duloxetine to 60 mg daily Ambulatory referral to Hematology / Oncology  Recent consult visits:  09/09/2021 Dr. [name] MD (Pain Medicine) Lumbar Epidural injection given 09/03/2021 Dr. Raul Del MD (Pulmonology) start augmentin 500 mg q 12 x 10 days,medrol dos pak 08/13/2021 Dr. Tasia Catchings MD (Oncology) No Medication Changes noted, follow up 3 months 08/12/2021 Gardiner Barefoot DPM (Podiatry)No medication changes noted 07/31/2021 Gardiner Barefoot DPM (Podiatry)No medication changes noted  Hospital visits:  Medication Reconciliation was completed by comparing discharge summary, patients EMR and Pharmacy list, and upon discussion with patient.  Admitted to the hospital on 08/20/2021 due to [place] Capsule study. Discharge date was 08/20/2021. Discharged from [facility]?Medications Started at [facility] Discharge:?? -started None   Medication Changes at Hospital Discharge: -Changed None  Medications Discontinued at Hospital Discharge: -Stopped None  Medications that remain the same after Hospital Discharge:??  -All other medications will remain the same.    Medications: Outpatient Encounter Medications as of 09/19/2021  Medication Sig Note   albuterol (PROVENTIL HFA;VENTOLIN HFA) 108 (90 Base) MCG/ACT inhaler Inhale 2 puffs into the lungs every 6 (six) hours as needed for wheezing or shortness of breath.     albuterol (PROVENTIL) (2.5 MG/3ML) 0.083% nebulizer solution Take 2.5 mg by nebulization every 6 (six) hours as needed for wheezing or shortness of breath.     aspirin EC 81 MG tablet Take 1 tablet (81 mg total) by mouth daily.    clopidogrel (PLAVIX) 75 MG tablet TAKE 1 TABLET(75 MG) BY MOUTH DAILY WITH BREAKFAST    cyclobenzaprine (FLEXERIL) 10 MG tablet  Take 1 tablet (10 mg total) by mouth 2 (two) times daily as needed.    dexlansoprazole (DEXILANT) 60 MG capsule Take 1 capsule (60 mg total) by mouth daily.    DULoxetine (CYMBALTA) 30 MG capsule Take 1 capsule (30 mg total) by mouth daily.    DULoxetine (CYMBALTA) 60 MG capsule Take 1 capsule (60 mg total) by mouth daily.    ezetimibe (ZETIA) 10 MG tablet TAKE 1 TABLET(10 MG) BY MOUTH DAILY    Fluticasone-Umeclidin-Vilant (TRELEGY ELLIPTA) 100-62.5-25 MCG/INH AEPB Inhale 1 each into the lungs daily. In place of breo    furosemide (LASIX) 20 MG tablet TAKE 1 TABLET BY MOUTH DAILY AS NEEDED FOR WEIGHT GAIN OF 2 POUNDS OVERNIGHT OR 5 POUNDS PER 1 WEEK    gabapentin (NEURONTIN) 300 MG capsule Take 1 capsule (300 mg total) by mouth 3 (three) times daily.    gabapentin (NEURONTIN) 800 MG tablet Take 800 mg by mouth at bedtime.    insulin lispro (HUMALOG) 100 UNIT/ML KiwkPen Inject 7-8 Units into the skin 3 (three) times daily. Reported on 12/02/2015/ sliding scale 1 unit for every 7 units of carbs; and 1 unit for every 20 above 120    Insulin Pen Needle 32G X 4 MM MISC Inject 1 each into the skin as needed.    JARDIANCE 10 MG TABS tablet TAKE 1 TABLET(10 MG) BY MOUTH DAILY BEFORE BREAKFAST    LANTUS SOLOSTAR 100 UNIT/ML Solostar Pen Inject 23-29 Units into the skin daily at 10 pm. 07/24/2021: Pt taking 26-29 units daily at bedtime    levocetirizine (XYZAL) 5 MG tablet TAKE 1 TABLET(5 MG) BY MOUTH EVERY EVENING    levothyroxine (SYNTHROID) 50 MCG tablet  TAKE 1 TABLET BY MOUTH DAILY BEFORE BREAKFAST AND 2 TABLETS ON SUNDAY. RECHECK LEVEL IN 6 WEEKS    loratadine (CLARITIN) 10 MG tablet Take 10 mg by mouth daily.     metFORMIN (GLUCOPHAGE) 1000 MG tablet Take 1,000 mg by mouth 2 (two) times daily with a meal.    metoprolol succinate (TOPROL-XL) 100 MG 24 hr tablet TAKE 1 TABLET(100 MG) BY MOUTH DAILY WITH OR IMMEDIATELY FOLLOWING A MEAL    montelukast (SINGULAIR) 10 MG tablet Take 1 tablet by mouth at  bedtime.    Multiple Vitamins-Minerals (MENS MULTI VITAMIN & MINERAL PO) Take 1 tablet by mouth in the morning and at bedtime.    naloxone (NARCAN) nasal spray 4 mg/0.1 mL For excess sedation from opioids    nitroGLYCERIN (NITROSTAT) 0.3 MG SL tablet Place 1 tablet (0.3 mg total) under the tongue every 5 (five) minutes as needed for chest pain. Maximum 3 doses.    Oxycodone HCl 10 MG TABS Take 1 tablet (10 mg total) by mouth every 8 (eight) hours.    [START ON 10/09/2021] Oxycodone HCl 10 MG TABS Take 1 tablet (10 mg total) by mouth every 8 (eight) hours.    OZEMPIC, 1 MG/DOSE, 4 MG/3ML SOPN Inject 1 mg into the skin once a week.    rosuvastatin (CRESTOR) 5 MG tablet Take 5 mg by mouth daily.    tamsulosin (FLOMAX) 0.4 MG CAPS capsule TAKE 2 CAPSULES BY MOUTH DAILY    No facility-administered encounter medications on file as of 09/19/2021.    Care Gaps: Zoster Vaccines Ophthalmology Exam (last completed 08/02/2019) Star Rating Drug: Jardiance 10 mg last filled on 09/02/2021 for a 30-Day supply with [facility] Semaglutide 1 mg last filled on 08/21/2021 for a 28-Day supply with [facility] Rosuvastatin 5 mg last filled on 07/15/2021 for a 90-Day supply with [facility] Metformin 1000 mg last filled on 07/15/2021 for a 90-Day supply with [facility] Medication Fill Gaps: None  Recent Relevant Labs: Lab Results  Component Value Date/Time   HGBA1C 8.3 06/04/2021 12:00 AM   HGBA1C 7.9 01/29/2021 12:00 AM   MICROALBUR 1.4 05/05/2019 10:12 AM   MICROALBUR 204 05/18/2018 12:00 AM   MICROALBUR 76.1 05/18/2018 12:00 AM   MICROALBUR Neg 01/25/2018 11:59 AM    Kidney Function Lab Results  Component Value Date/Time   CREATININE 1.26 07/17/2021 11:17 AM   CREATININE 1.22 12/25/2020 11:35 AM   CREATININE 1.64 (H) 10/16/2019 02:24 PM   CREATININE 1.54 (H) 05/05/2019 10:12 AM   GFRNONAA >60 12/25/2020 11:35 AM   GFRNONAA 45 (L) 10/16/2019 02:24 PM    GFRAA 61 09/17/2020 11:17 AM   GFRAA 52 (L) 10/16/2019 02:24 PM    Current antihyperglycemic regimen:  Humalog 1 unit per 8g of carb, 1 unit for every 20 glucose above 120 three times daily  Lantus 20-23 units daily  Jardiance 10 mg daily  Metformin 1000 mg twice daily  Ozempic 1 mg weekly  What recent interventions/DTPs have been made to improve glycemic control:  None ID Have there been any recent hospitalizations or ED visits since last visit with CPP? Yes I have attempted without success to contact this patient by phone three times to do his Diabetes Disease State call. I left a Voice message for patient to return my call.   Adherence Review: Is the patient currently on a STATIN medication? Yes Is the patient currently on ACE/ARB medication? No Does the patient have >5 day gap between  last estimated fill dates? No  Anderson Malta Clinical Production designer, theatre/television/film [phone]

## 2021-09-22 ENCOUNTER — Telehealth: Payer: Self-pay | Admitting: *Deleted

## 2021-09-22 ENCOUNTER — Other Ambulatory Visit: Payer: Self-pay

## 2021-09-22 DIAGNOSIS — R634 Abnormal weight loss: Secondary | ICD-10-CM

## 2021-09-22 DIAGNOSIS — D500 Iron deficiency anemia secondary to blood loss (chronic): Secondary | ICD-10-CM

## 2021-09-22 NOTE — Telephone Encounter (Signed)
Order entered

## 2021-09-22 NOTE — Telephone Encounter (Signed)
Patient wife called with concerns that patient continues to lose weight, 12 pounds in past 2 weeks and 60 pounds in past 6 months all of which is unintentional.  He has an appointment with his PCP Dr Ancil Boozer tomorrow for the same reason, weight loss, but she wanted Dr Tasia Catchings to know also and to see if she has any suggestions of tests to do to find the reason for his weight loss.or if she wants to wait to see what Dr Ancil Boozer says and collaborate with her. Please advise

## 2021-09-22 NOTE — Telephone Encounter (Signed)
Wife informed to expect a call for an appointment for a CT CAP for unintentional weight loss. She thanked me for this

## 2021-09-23 ENCOUNTER — Encounter: Payer: Self-pay | Admitting: Internal Medicine

## 2021-09-23 ENCOUNTER — Ambulatory Visit (INDEPENDENT_AMBULATORY_CARE_PROVIDER_SITE_OTHER): Payer: Medicare Other | Admitting: Internal Medicine

## 2021-09-23 VITALS — BP 116/68 | HR 65 | Temp 97.6°F | Resp 16 | Ht 67.0 in | Wt 206.7 lb

## 2021-09-23 DIAGNOSIS — E063 Autoimmune thyroiditis: Secondary | ICD-10-CM | POA: Diagnosis not present

## 2021-09-23 DIAGNOSIS — Z125 Encounter for screening for malignant neoplasm of prostate: Secondary | ICD-10-CM

## 2021-09-23 DIAGNOSIS — R634 Abnormal weight loss: Secondary | ICD-10-CM | POA: Diagnosis not present

## 2021-09-23 DIAGNOSIS — D508 Other iron deficiency anemias: Secondary | ICD-10-CM | POA: Diagnosis not present

## 2021-09-23 NOTE — Patient Instructions (Signed)
It was great seeing you today!  Plan discussed at today's visit: -Blood work ordered today, results will be uploaded to [facility].  -Start keeping a food diary and weight yourself at home about once a week, bring this in with you to our next visit -Ask GI doctor about gastroparesis   Follow up in: 1 month  Take care and let us know if you have any questions or concerns prior to your next visit.  Dr. Rosana Berger

## 2021-09-23 NOTE — Progress Notes (Signed)
Acute Office Visit  Subjective:    Patient ID: [name], male    DOB: 1961/04/04, 60 y.o.   MRN: [id number]  Chief Complaint  Patient presents with   Weight Loss    HPI Patient is in today to discuss unintentional weight loss. BMI today 32. He does have type 2 diabetes and follows with Endocrinology for this. His last A1c was 8.3% in August. He is currently on Metformin, Jardiance, Ozempic and insulin (lantus 23-26 a night and Humalog 8 units per meal). Twice a week will have hypoglycemic events, Endocrinology decreasing insulin. Weight 247 last year. Following with Hematology for anemia, getting iron infusions. Planning a CT A/P with contrast scheduled in January.   Diet: egg sandwich, poptarts, yogurt. Dinner: protein, vegatable, carbs. 2 meals a day with a light snack. Appetite and and diet the same as last year. Finished about 100% of meals. Loss taste for meat.  Exercise: walks 3-4 dog walks a day, walking 1.5 miles total, no other exericse, normal routine no changes.   Other health: fillings falling out, seeing dentist currently on antibiotic for this  Vomits bile in the morning, once every 2 weeks. Dexilant helps but doesn't change symptoms. Seeing GI. Had an EGD earlier this year that has to be repeated due to delayed gastric emptying. Colonoscopy normal.   Health Maintenance: -PSA due -Colonoscopy 2022, repeat in 5 years. Planning to repeat EGD    Past Medical History:  Diagnosis Date   Allergy    dust, seasonal (worse in the fall).   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 Followed by Pain Specialist in [place], back and neck   Asthma    BRONCHITIS   Cataract    First Dx in 2012   Chronic combined systolic and diastolic congestive heart failure ([place])    a. 03/2018 Echo: EF 30-35%; b. 07/2018 Echo: EF 35-40%; c. 09/2019 TEE: EF 40-45%; d. 12/2020 Echo: EF 35-40%, sev apical ant, apical lat, apical inf, apical HK. GrI DD, nl RV fxn.   Coronary artery disease    a.  Prior Ant MI->s/p multiple stents placed in the LAD and right coronary artery ([place]); b. 2016 Cath: reportedly nonobs dzs;  c. 04/2018 Cath/PCI: LM nl, LAD 20p, patent mid stent, LCX 38m(3.25x15 Sierra DES), OM1 nl, OM2 50, OM3 40 w/ patent stent, RCA 40p, 38m, 40d w/ patent stent in RPDA, RPAV 60, EF 25-35%. 2+MR; d. 02/2019 MV: Apical scar, no isch, EF 30-44%.   Deaf, left    Diabetes mellitus without complication ([place])    TYPE 2   Diabetic peripheral neuropathy ([place])    feet and hands   FUO (fever of unknown origin) 08/03/2018   GERD (gastroesophageal reflux disease)    Headache    muscle tension   Hyperlipidemia    Hypertension    CONTROLLED ON MEDS   Hyperthyroidism    Insomnia    Ischemic cardiomyopathy    a. 03/2018 Echo: EF 30-35%, ant, antlat, apical AK, Gr1 DD; b. 07/2018 Echo: EF 35-40%, anteroseptal, apical, and ant HK. Gr1 DD; c. 09/2019 TEE: EF 40-45%; d. 12/2020 Echo: EF 35-40%, sev apical ant, apical lat, apical inf, apical HK. GrI DD, nl RV fxn.   Knee pain, acute 05/06/2020   Left arm weakness 10/04/2019   Left leg weakness 12/01/2019   Lyme disease    Chronic   Myasthenia gravis ([place])    (03/18/21 - no current treatment - "better than it has  ever been" per pt)   Myocardial infarction ([place]) 2010   Palpitations    a. 10/2020 Zio: RSR, 88 avg. 3 brief SVT episodes (max 5 beats @ 128). Rare PACs/PVCs. Triggered events did not correlate w/ significant arrthymia - some w/ sinus tach.   Seasonal allergies    Sepsis ([place])    a.07/2018 - unknown source. TEE neg for veg 09/2019.   Sleep apnea    CPAP   Wears hearing aid in both ears     Past Surgical History:  Procedure Laterality Date   BILATERAL CARPAL TUNNEL RELEASE Bilateral L in 2012 and R in 2013   [place], most recent in  March 2016.   COLONOSCOPY WITH PROPOFOL N/A 01/10/2016   Procedure: COLONOSCOPY WITH PROPOFOL;  Surgeon: [name], MD;  Location: [facility];  Service:  Endoscopy;  Laterality: N/A;   COLONOSCOPY WITH PROPOFOL N/A 04/14/2021   Procedure: COLONOSCOPY WITH PROPOFOL;  Surgeon: [name], MD;  Location: [facility];  Service: Endoscopy;  Laterality: N/A;  Diabetic - insulin and oral meds   CORONARY ANGIOPLASTY     CORONARY STENT INTERVENTION N/A 04/25/2018   Procedure: CORONARY STENT INTERVENTION;  Surgeon: [name], MD;  Location: [facility];  Service: Cardiovascular;  Laterality: N/A;   ESOPHAGOGASTRODUODENOSCOPY (EGD) WITH PROPOFOL N/A 01/10/2016   Procedure: ESOPHAGOGASTRODUODENOSCOPY (EGD) WITH PROPOFOL;  Surgeon: [name], MD;  Location: [facility];  Service: Endoscopy;  Laterality: N/A;   ESOPHAGOGASTRODUODENOSCOPY (EGD) WITH PROPOFOL N/A 04/14/2021   Procedure: ESOPHAGOGASTRODUODENOSCOPY (EGD) WITH BIOPSY;  Surgeon: [name], MD;  Location: [facility];  Service: Endoscopy;  Laterality: N/A;   EYE SURGERY Bilateral 2012   cataract/bilateral vitrectomies   GIVENS CAPSULE STUDY N/A 08/20/2021   Procedure: GIVENS CAPSULE STUDY;  Surgeon: [name], MD;  Location: [facility] ENDOSCOPY;  Service: Endoscopy;  Laterality: N/A;   LEFT HEART CATH AND CORONARY ANGIOGRAPHY Left 04/25/2018   Procedure: LEFT HEART CATH AND CORONARY ANGIOGRAPHY;  Surgeon: [name], MD;  Location: [facility];  Service: Cardiovascular;  Laterality: Left;   TEE WITHOUT CARDIOVERSION N/A 09/05/2018   Procedure: TRANSESOPHAGEAL ECHOCARDIOGRAM (TEE);  Surgeon: [name], MD;  Location: [facility];  Service: Cardiovascular;  Laterality: N/A;   TONSILLECTOMY AND ADENOIDECTOMY     As a child   TUNNELED VENOUS CATHETER PLACEMENT     removed    Family History  Problem Relation Age of Onset   Diabetes Mother    Heart disease Mother    Cancer Father        Prostate CA, Anal cancer    Dementia Father    Diabetes Brother    Healthy Brother    Healthy Brother     Social History   Socioeconomic History    Marital status: Married    Spouse name: Neoma Laming   Number of children: 0   Years of education: Not on file   Highest education level: Master's degree (e.g., MA, MS, MEng, MEd, MSW, MBA)  Occupational History   Occupation: disabled    Comment: multiple medical problems - first approved for uncontrolled DM, but now has heart disease and Myasthenia Gravis   Tobacco Use   Smoking status: Never   Smokeless tobacco: Never   Tobacco comments:    smoking cessation materials not required  Vaping Use   Vaping Use: Never used  Substance and Sexual Activity   Alcohol use: Not Currently    Alcohol/week: 0.0  standard drinks   Drug use: No   Sexual activity: Not Currently    Partners: Female  Other Topics Concern   Not on file  Social History Narrative   Not on file   Social Determinants of Health   Financial Resource Strain: Low Risk    Difficulty of Paying Living Expenses: Not very hard  Food Insecurity: No Food Insecurity   Worried About Running Out of Food in the Last Year: Never true   Ran Out of Food in the Last Year: Never true  Transportation Needs: No Transportation Needs   Lack of Transportation (Medical): No   Lack of Transportation (Non-Medical): No  Physical Activity: Insufficiently Active   Days of Exercise per Week: 7 days   Minutes of Exercise per Session: 20 min  Stress: Stress Concern Present   Feeling of Stress : To some extent  Social Connections: Socially Integrated   Frequency of Communication with Friends and Family: More than three times a week   Frequency of Social Gatherings with Friends and Family: Three times a week   Attends Religious Services: More than 4 times per year   Active Member of Clubs or Organizations: Yes   Attends Music therapist: More than 4 times per year   Marital Status: Married  Human resources officer Violence: Not At Risk   Fear of Current or Ex-Partner: No   Emotionally Abused: No   Physically Abused: No   Sexually Abused:  No    Outpatient Medications Prior to Visit  Medication Sig Dispense Refill   albuterol (PROVENTIL HFA;VENTOLIN HFA) 108 (90 Base) MCG/ACT inhaler Inhale 2 puffs into the lungs every 6 (six) hours as needed for wheezing or shortness of breath.      albuterol (PROVENTIL) (2.5 MG/3ML) 0.083% nebulizer solution Take 2.5 mg by nebulization every 6 (six) hours as needed for wheezing or shortness of breath.      aspirin EC 81 MG tablet Take 1 tablet (81 mg total) by mouth daily. 90 tablet 3   clopidogrel (PLAVIX) 75 MG tablet TAKE 1 TABLET(75 MG) BY MOUTH DAILY WITH BREAKFAST 30 tablet 6   cyclobenzaprine (FLEXERIL) 10 MG tablet Take 1 tablet (10 mg total) by mouth 2 (two) times daily as needed. 30 tablet 5   dexlansoprazole (DEXILANT) 60 MG capsule Take 1 capsule (60 mg total) by mouth daily. 30 capsule 11   DULoxetine (CYMBALTA) 30 MG capsule Take 1 capsule (30 mg total) by mouth daily. 90 capsule 1   DULoxetine (CYMBALTA) 60 MG capsule Take 1 capsule (60 mg total) by mouth daily. 90 capsule 1   ezetimibe (ZETIA) 10 MG tablet TAKE 1 TABLET(10 MG) BY MOUTH DAILY 90 tablet 3   Fluticasone-Umeclidin-Vilant (TRELEGY ELLIPTA) 100-62.5-25 MCG/INH AEPB Inhale 1 each into the lungs daily. In place of breo 60 each 2   furosemide (LASIX) 20 MG tablet TAKE 1 TABLET BY MOUTH DAILY AS NEEDED FOR WEIGHT GAIN OF 2 POUNDS OVERNIGHT OR 5 POUNDS PER 1 WEEK 90 tablet 0   gabapentin (NEURONTIN) 300 MG capsule Take 1 capsule (300 mg total) by mouth 3 (three) times daily. 90 capsule 1   gabapentin (NEURONTIN) 800 MG tablet Take 800 mg by mouth at bedtime.     insulin lispro (HUMALOG) 100 UNIT/ML KiwkPen Inject 7-8 Units into the skin 3 (three) times daily. Reported on 12/02/2015/ sliding scale 1 unit for every 7 units of carbs; and 1 unit for every 20 above 120     Insulin Pen Needle 32G  X 4 MM MISC Inject 1 each into the skin as needed.     JARDIANCE 10 MG TABS tablet TAKE 1 TABLET(10 MG) BY MOUTH DAILY BEFORE BREAKFAST  30 tablet 4   LANTUS SOLOSTAR 100 UNIT/ML Solostar Pen Inject 23-29 Units into the skin daily at 10 pm.  0   levocetirizine (XYZAL) 5 MG tablet TAKE 1 TABLET(5 MG) BY MOUTH EVERY EVENING 30 tablet 3   levothyroxine (SYNTHROID) 50 MCG tablet TAKE 1 TABLET BY MOUTH DAILY BEFORE BREAKFAST AND 2 TABLETS ON SUNDAY. RECHECK LEVEL IN 6 WEEKS 32 tablet 0   loratadine (CLARITIN) 10 MG tablet Take 10 mg by mouth daily.      metFORMIN (GLUCOPHAGE) 1000 MG tablet Take 1,000 mg by mouth 2 (two) times daily with a meal.     metoprolol succinate (TOPROL-XL) 100 MG 24 hr tablet TAKE 1 TABLET(100 MG) BY MOUTH DAILY WITH OR IMMEDIATELY FOLLOWING A MEAL 90 tablet 2   montelukast (SINGULAIR) 10 MG tablet Take 1 tablet by mouth at bedtime.     Multiple Vitamins-Minerals (MENS MULTI VITAMIN & MINERAL PO) Take 1 tablet by mouth in the morning and at bedtime.     naloxone (NARCAN) nasal spray 4 mg/0.1 mL For excess sedation from opioids 1 kit 2   nitroGLYCERIN (NITROSTAT) 0.3 MG SL tablet Place 1 tablet (0.3 mg total) under the tongue every 5 (five) minutes as needed for chest pain. Maximum 3 doses. 25 tablet 3   Oxycodone HCl 10 MG TABS Take 1 tablet (10 mg total) by mouth every 8 (eight) hours. 90 tablet 0   [START ON 10/09/2021] Oxycodone HCl 10 MG TABS Take 1 tablet (10 mg total) by mouth every 8 (eight) hours. 90 tablet 0   OZEMPIC, 1 MG/DOSE, 4 MG/3ML SOPN Inject 1 mg into the skin once a week.     rosuvastatin (CRESTOR) 5 MG tablet Take 5 mg by mouth daily.  0   tamsulosin (FLOMAX) 0.4 MG CAPS capsule TAKE 2 CAPSULES BY MOUTH DAILY 60 capsule 2   No facility-administered medications prior to visit.    Allergies  Allergen Reactions   Azathioprine Other (See Comments)    Azathioprine hypersensitivity reaction - Symptoms mimicking sepsis - was hospitalized   Novolog [Insulin Aspart] Hives    Review of Systems  Constitutional:  Positive for unexpected weight change. Negative for activity change, appetite  change, chills, fatigue and fever.  Respiratory:  Positive for shortness of breath. Negative for cough and wheezing.   Gastrointestinal:  Positive for constipation, nausea and vomiting. Negative for abdominal pain, blood in stool and diarrhea.  Genitourinary:  Positive for difficulty urinating. Negative for dysuria and hematuria.  Skin: Negative.   Neurological:  Positive for dizziness and weakness.      Objective:    Physical Exam Constitutional:      Appearance: Normal appearance.  HENT:     Head: Normocephalic and atraumatic.     Mouth/Throat:     Mouth: Mucous membranes are moist.     Pharynx: Oropharynx is clear.  Eyes:     Conjunctiva/sclera: Conjunctivae normal.  Cardiovascular:     Rate and Rhythm: Normal rate and regular rhythm.  Pulmonary:     Effort: Pulmonary effort is normal.     Breath sounds: Normal breath sounds.  Abdominal:     General: There is no distension.     Palpations: Abdomen is soft.     Tenderness: There is no abdominal tenderness. There is no right CVA tenderness,  left CVA tenderness, guarding or rebound.     Comments: Hypoactive bowel sounds  Musculoskeletal:     Right lower leg: No edema.     Left lower leg: No edema.  Skin:    General: Skin is warm and dry.  Neurological:     General: No focal deficit present.     Mental Status: He is alert.  Psychiatric:        Mood and Affect: Mood normal.        Behavior: Behavior normal.    BP 116/68    Pulse 65    Temp 97.6 F (36.4 C)    Resp 16    Ht $R'5\' 7"'VB$  (1.702 m)    Wt 206 lb 11.2 oz (93.8 kg)    SpO2 97%    BMI 32.37 kg/m  Wt Readings from Last 3 Encounters:  09/23/21 206 lb 11.2 oz (93.8 kg)  09/09/21 210 lb (95.3 kg)  09/02/21 211 lb 10.3 oz (96 kg)    Health Maintenance Due  Topic Date Due   Zoster Vaccines- Shingrix (1 of 2) Never done   URINE MICROALBUMIN  05/04/2020   OPHTHALMOLOGY EXAM  08/01/2020    There are no preventive care reminders to display for this patient.   Lab  Results  Component Value Date   TSH 1.41 04/02/2021   Lab Results  Component Value Date   WBC 7.0 08/13/2021   HGB 11.9 (L) 08/13/2021   HCT 38.0 (L) 08/13/2021   MCV 86.0 08/13/2021   PLT 210 08/13/2021   Lab Results  Component Value Date   NA 141 07/17/2021   K 5.0 07/17/2021   CO2 20 07/17/2021   GLUCOSE 122 (H) 07/17/2021   BUN 21 07/17/2021   CREATININE 1.26 07/17/2021   BILITOT 0.3 07/17/2021   ALKPHOS 99 07/17/2021   AST 37 07/17/2021   ALT 25 07/17/2021   PROT 6.2 07/17/2021   ALBUMIN 4.2 07/17/2021   CALCIUM 9.7 07/17/2021   ANIONGAP 8 12/25/2020   EGFR 64 07/17/2021   Lab Results  Component Value Date   CHOL 92 (L) 07/17/2021   Lab Results  Component Value Date   HDL 28 (L) 07/17/2021   Lab Results  Component Value Date   LDLCALC 39 07/17/2021   Lab Results  Component Value Date   TRIG 145 07/17/2021   Lab Results  Component Value Date   CHOLHDL 3.3 07/17/2021   Lab Results  Component Value Date   HGBA1C 8.3 06/04/2021       Assessment & Plan:   1. Unintentional weight loss/ Prostate cancer screening/Hypothyroidism, acquired, autoimmune/Other iron deficiency anemia: Lost about 40 pounds in the last year unintentionally. No other constitutional symptoms. Does have multiple chronic medical conditions and is on multiple medications, reviewed list and nothing that should be causing weight loss. Diabetes is uncontrolled although sugars are lower with a few hypoglycemic events. Seeing Endo next week, cutting down on insulin. Consider gastroparesis and delayed gastric emptying. He will discuss with GI doctor. Up to date for the most part with cancer screening, we will check blood work today. Hematology planning CT A/P in January. Discussed keeping a food journal and he will follow up in 1 month for weight check.   - TSH - PSA - CBC w/Diff/Platelet - COMPLETE METABOLIC PANEL WITH GFR   Teodora Medici, DO

## 2021-09-24 LAB — COMPLETE METABOLIC PANEL WITH GFR
AG Ratio: 1.9 (calc) (ref 1.0–2.5)
ALT: 21 U/L (ref 9–46)
AST: 18 U/L (ref 10–35)
Albumin: 4 g/dL (ref 3.6–5.1)
Alkaline phosphatase (APISO): 67 U/L (ref 35–144)
BUN: 25 mg/dL (ref 7–25)
CO2: 28 mmol/L (ref 20–32)
Calcium: 9.1 mg/dL (ref 8.6–10.3)
Chloride: 103 mmol/L (ref 98–110)
Creat: 1.16 mg/dL (ref 0.70–1.35)
Globulin: 2.1 g/dL (calc) (ref 1.9–3.7)
Glucose, Bld: 174 mg/dL — ABNORMAL HIGH (ref 65–99)
Potassium: 4.4 mmol/L (ref 3.5–5.3)
Sodium: 139 mmol/L (ref 135–146)
Total Bilirubin: 0.4 mg/dL (ref 0.2–1.2)
Total Protein: 6.1 g/dL (ref 6.1–8.1)
eGFR: 71 mL/min/{1.73_m2} (ref >=60)

## 2021-09-24 LAB — CBC WITH DIFFERENTIAL/PLATELET
Absolute Monocytes: 550 cells/uL (ref 200–950)
Basophils Absolute: 17 cells/uL (ref 0–200)
Basophils Relative: 0.2 %
Eosinophils Absolute: 163 cells/uL (ref 15–500)
Eosinophils Relative: 1.9 %
HCT: 39.7 % (ref 38.5–50.0)
Hemoglobin: 12.7 g/dL — ABNORMAL LOW (ref 13.2–17.1)
Lymphs Abs: 1015 cells/uL (ref 850–3900)
MCH: 27.9 pg (ref 27.0–33.0)
MCHC: 32 g/dL (ref 32.0–36.0)
MCV: 87.3 fL (ref 80.0–100.0)
MPV: 10.4 fL (ref 7.5–12.5)
Monocytes Relative: 6.4 %
Neutro Abs: 6854 cells/uL (ref 1500–7800)
Neutrophils Relative %: 79.7 %
Platelets: 185 10*3/uL (ref 140–400)
RBC: 4.55 10*6/uL (ref 4.20–5.80)
RDW: 15.4 % — ABNORMAL HIGH (ref 11.0–15.0)
Total Lymphocyte: 11.8 %
WBC: 8.6 10*3/uL (ref 3.8–10.8)

## 2021-09-24 LAB — TSH: TSH: 2.18 mIU/L (ref 0.40–4.50)

## 2021-09-24 LAB — PSA: PSA: 0.56 ng/mL (ref <=4.00)

## 2021-09-25 ENCOUNTER — Other Ambulatory Visit: Payer: Self-pay

## 2021-09-25 ENCOUNTER — Inpatient Hospital Stay: Payer: Medicare Other

## 2021-09-25 VITALS — BP 138/81 | HR 92 | Temp 97.4°F | Resp 16

## 2021-09-25 DIAGNOSIS — D500 Iron deficiency anemia secondary to blood loss (chronic): Secondary | ICD-10-CM

## 2021-09-25 MED ORDER — SODIUM CHLORIDE 0.9 % IV SOLN
Freq: Once | INTRAVENOUS | Status: AC
  Filled 2021-09-25: qty 250

## 2021-09-25 MED ORDER — IRON SUCROSE 20 MG/ML IV SOLN
200.0000 mg | Freq: Once | INTRAVENOUS | Status: AC
  Administered 2021-09-25: 14:00:00 200 mg via INTRAVENOUS
  Filled 2021-09-25: qty 10

## 2021-09-25 MED ORDER — SODIUM CHLORIDE 0.9 % IV SOLN: 200.0000 mg | Freq: Once | INTRAVENOUS | Status: DC

## 2021-09-25 NOTE — Patient Instructions (Signed)

## 2021-10-07 ENCOUNTER — Telehealth: Payer: Self-pay | Admitting: Internal Medicine

## 2021-10-07 ENCOUNTER — Other Ambulatory Visit: Payer: Self-pay | Admitting: Family Medicine

## 2021-10-07 ENCOUNTER — Telehealth: Payer: Self-pay | Admitting: Cardiovascular Disease

## 2021-10-07 NOTE — Telephone Encounter (Signed)
° °  Pre-operative Risk Assessment    Patient Name: [name]  DOB: Jan 18, 1961 MRN: [id number]      Request for Surgical Clearance    Procedure:  12 dental extractions   Date of Surgery:  Clearance TBD                                 Surgeon:  Consuella Lose Surgeon's Group or Practice Name:  [facility] [place] Phone number:  [phone] Fax number:  [phone]   Type of Clearance Requested:  Both - Medical    Type of Anesthesia:  Not Indicated   Additional requests/questions:    Signed, Pilar A Ham   10/07/2021, 3:26 PM

## 2021-10-07 NOTE — Telephone Encounter (Signed)
Left a message for the patient to call back and speak to the on-call preop APP of the day.  Patient was able to hold Plavix for 5 days in 2021 prior to GI procedure.  As long as he does not have any recent chest discomfort, I think he can hold the Plavix again prior to dental procedure.

## 2021-10-08 NOTE — Telephone Encounter (Signed)
Refilled 10/07/2021. Requested Prescriptions  Pending Prescriptions Disp Refills   levothyroxine (SYNTHROID) 50 MCG tablet [Pharmacy Med Name: LEVOTHYROXINE 0.05MG  (50MCG) TAB] 102 tablet     Sig: TAKE 1 TABLET BY MOUTH DAILY MONDAY THROUGH SATURDAY AND 2 TABLETS EVERY SUNDAY, TAKE BEFORE BREAKFAST(RECHECK LEVELS IN 6 WEEKS)     Endocrinology:  Hypothyroid Agents Failed - 10/07/2021  4:50 PM      Failed - TSH needs to be rechecked within 3 months after an abnormal result. Refill until TSH is due.      Passed - TSH in normal range and within 360 days    TSH  Date Value Ref Range Status  09/23/2021 2.18 0.40 - 4.50 mIU/L Final         Passed - Valid encounter within last 12 months    Recent Outpatient Visits          2 weeks ago Unintentional weight loss   [facility] Teodora Medici, DO   2 months ago Type 2 diabetes mellitus with diabetic polyneuropathy, with long-term current use of insulin [facility])   [facility] [place], Drue Stager, MD   3 months ago Mild asthma with exacerbation, unspecified whether persistent   [place], DO   6 months ago Hypothyroidism, acquired, autoimmune   [facility] [place], Drue Stager, MD   1 year ago Type 2 diabetes mellitus with diabetic polyneuropathy, with long-term current use of insulin [facility])   [facility] [name], MD      Future Appointments            In 2 days Teodora Medici, DO [facility], [place]   In 2 weeks Teodora Medici, DO [facility], [place]   In 1 month [name], MD [facility], [place]   In 9 months  [facility], [facility]

## 2021-10-08 NOTE — Telephone Encounter (Signed)
° °  Name: [name]  DOB: 1961/08/08  MRN: [id number]   Primary Cardiologist: [name], MD  Chart reviewed as part of pre-operative protocol coverage. Patient was contacted 10/08/2021 in reference to pre-operative risk assessment for pending surgery as outlined below.  [name] was last seen on 07/17/2021 by Ignacia Bayley NP.  Since that day, [name] has done well without chest pain or worsening dyspnea.  Therefore, based on ACC/AHA guidelines, the patient would be at acceptable risk for the planned procedure without further cardiovascular testing.   He does not need SBE prophylaxis. He may hold plavix for 5 days prior to the procedure and restart as soon as possible afterward at the surgeon's discretion. He should continue on the aspirin through the procedure.   The patient was advised that if he develops new symptoms prior to surgery to contact our office to arrange for a follow-up visit, and he verbalized understanding.  I will route this recommendation to the requesting party via Epic fax function and remove from pre-op pool. Please call with questions.  [place], [place] 10/08/2021, 3:05 PM

## 2021-10-10 ENCOUNTER — Encounter: Payer: Self-pay | Admitting: Internal Medicine

## 2021-10-10 ENCOUNTER — Ambulatory Visit (INDEPENDENT_AMBULATORY_CARE_PROVIDER_SITE_OTHER): Payer: Medicare Other | Admitting: Internal Medicine

## 2021-10-10 VITALS — BP 112/64 | HR 90 | Temp 98.2°F | Resp 18 | Ht 67.0 in | Wt 206.6 lb

## 2021-10-10 DIAGNOSIS — J40 Bronchitis, not specified as acute or chronic: Secondary | ICD-10-CM | POA: Diagnosis not present

## 2021-10-10 DIAGNOSIS — R051 Acute cough: Secondary | ICD-10-CM

## 2021-10-10 DIAGNOSIS — R52 Pain, unspecified: Secondary | ICD-10-CM | POA: Diagnosis not present

## 2021-10-10 LAB — POCT INFLUENZA A/B
Influenza A, POC: NEGATIVE
Influenza B, POC: NEGATIVE

## 2021-10-10 MED ORDER — BENZONATATE 100 MG PO CAPS: 100.0000 mg | ORAL_CAPSULE | Freq: Two times a day (BID) | ORAL | 0 refills | Status: DC | PRN

## 2021-10-10 MED ORDER — PREDNISONE 20 MG PO TABS
ORAL_TABLET | ORAL | 0 refills | Status: AC
Start: 2021-10-10 — End: 2021-10-16

## 2021-10-10 MED ORDER — ALBUTEROL SULFATE (2.5 MG/3ML) 0.083% IN NEBU: 2.5000 mg | INHALATION_SOLUTION | Freq: Four times a day (QID) | RESPIRATORY_TRACT | 2 refills | Status: AC | PRN

## 2021-10-10 MED ORDER — AMOXICILLIN-POT CLAVULANATE 875-125 MG PO TABS: 1.0000 | ORAL_TABLET | Freq: Two times a day (BID) | ORAL | 0 refills | Status: AC

## 2021-10-10 NOTE — Progress Notes (Signed)
Acute Office Visit  Subjective:    Patient ID: [name], male    DOB: 1960/12/26, 61 y.o.   MRN: [id number]  Chief Complaint  Patient presents with   URI    Cough, body aches, and congestion    HPI Patient is in today for nasal congestion.  URI Compliant:   -Worst symptom: cough, chest congestion x 2 weeks -Fever: no -Cough: yes, dry cough but when can produce mucus it is thick and green -Shortness of breath: yes -Wheezing: yes -Chest pain: no -Chest tightness: yes -Chest congestion: yes -Nasal congestion: yes -Runny nose: no -Post nasal drip: yes -Sore throat: yes -Sinus pressure: no -Headache: no -Face pain: no -Ear pain: yes bilateral -Ear pressure: yes bilateral -Recently finished antibiotics Amoxicillin for 10 days for infected wisdom tooth -Vomiting: no -Sick contacts: yes -Context: worse -Relief with OTC cold/cough medications: no  -Treatments attempted: cold/sinus  Albuterol about 2-3 times a day, Trelegy, Alkaseltzer, Nightquil   Past Medical History:  Diagnosis Date   Allergy    dust, seasonal (worse in the fall).   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 Followed by Pain Specialist in [place], back and neck   Asthma    BRONCHITIS   Cataract    First Dx in 2012   Chronic combined systolic and diastolic congestive heart failure (HCC)    a. 03/2018 Echo: EF 30-35%; b. 07/2018 Echo: EF 35-40%; c. 09/2019 TEE: EF 40-45%; d. 12/2020 Echo: EF 35-40%, sev apical ant, apical lat, apical inf, apical HK. GrI DD, nl RV fxn.   Coronary artery disease    a. Prior Ant MI->s/p multiple stents placed in the LAD and right coronary artery ([place]); b. 2016 Cath: reportedly nonobs dzs;  c. 04/2018 Cath/PCI: LM nl, LAD 20p, patent mid stent, LCX 2m(3.25x15 Sierra DES), OM1 nl, OM2 50, OM3 40 w/ patent stent, RCA 40p, 57m, 40d w/ patent stent in RPDA, RPAV 60, EF 25-35%. 2+MR; d. 02/2019 MV: Apical scar, no isch, EF 30-44%.   Deaf, left    Diabetes mellitus without  complication (HCC)    TYPE 2   Diabetic peripheral neuropathy (HCC)    feet and hands   FUO (fever of unknown origin) 08/03/2018   GERD (gastroesophageal reflux disease)    Headache    muscle tension   Hyperlipidemia    Hypertension    CONTROLLED ON MEDS   Hyperthyroidism    Insomnia    Ischemic cardiomyopathy    a. 03/2018 Echo: EF 30-35%, ant, antlat, apical AK, Gr1 DD; b. 07/2018 Echo: EF 35-40%, anteroseptal, apical, and ant HK. Gr1 DD; c. 09/2019 TEE: EF 40-45%; d. 12/2020 Echo: EF 35-40%, sev apical ant, apical lat, apical inf, apical HK. GrI DD, nl RV fxn.   Knee pain, acute 05/06/2020   Left arm weakness 10/04/2019   Left leg weakness 12/01/2019   Lyme disease    Chronic   Myasthenia gravis (HCC)    (03/18/21 - no current treatment - "better than it has ever been" per pt)   Myocardial infarction (HCC) 2010   Palpitations    a. 10/2020 Zio: RSR, 88 avg. 3 brief SVT episodes (max 5 beats @ 128). Rare PACs/PVCs. Triggered events did not correlate w/ significant arrthymia - some w/ sinus tach.   Seasonal allergies    Sepsis (HCC)    a.07/2018 - unknown source. TEE neg for veg 09/2019.   Sleep apnea    CPAP   Wears hearing  aid in both ears     Past Surgical History:  Procedure Laterality Date   BILATERAL CARPAL TUNNEL RELEASE Bilateral L in 2012 and R in 2013   [place], most recent in  March 2016.   COLONOSCOPY WITH PROPOFOL N/A 01/10/2016   Procedure: COLONOSCOPY WITH PROPOFOL;  Surgeon: [name], MD;  Location: [facility] ENDOSCOPY;  Service: Endoscopy;  Laterality: N/A;   COLONOSCOPY WITH PROPOFOL N/A 04/14/2021   Procedure: COLONOSCOPY WITH PROPOFOL;  Surgeon: [name], MD;  Location: [place];  Service: Endoscopy;  Laterality: N/A;  Diabetic - insulin and oral meds   CORONARY ANGIOPLASTY     CORONARY STENT INTERVENTION N/A 04/25/2018   Procedure: CORONARY STENT INTERVENTION;  Surgeon: [name], MD;  Location: [place] CV LAB;  Service: Cardiovascular;  Laterality: N/A;   ESOPHAGOGASTRODUODENOSCOPY (EGD) WITH PROPOFOL N/A 01/10/2016   Procedure: ESOPHAGOGASTRODUODENOSCOPY (EGD) WITH PROPOFOL;  Surgeon: [name], MD;  Location: [facility] ENDOSCOPY;  Service: Endoscopy;  Laterality: N/A;   ESOPHAGOGASTRODUODENOSCOPY (EGD) WITH PROPOFOL N/A 04/14/2021   Procedure: ESOPHAGOGASTRODUODENOSCOPY (EGD) WITH BIOPSY;  Surgeon: [name], MD;  Location: [place];  Service: Endoscopy;  Laterality: N/A;   EYE SURGERY Bilateral 2012   cataract/bilateral vitrectomies   GIVENS CAPSULE STUDY N/A 08/20/2021   Procedure: GIVENS CAPSULE STUDY;  Surgeon: [name], MD;  Location: [facility] ENDOSCOPY;  Service: Endoscopy;  Laterality: N/A;   LEFT HEART CATH AND CORONARY ANGIOGRAPHY Left 04/25/2018   Procedure: LEFT HEART CATH AND CORONARY ANGIOGRAPHY;  Surgeon: [name], MD;  Location: [place] CV LAB;  Service: Cardiovascular;  Laterality: Left;   TEE WITHOUT CARDIOVERSION N/A 09/05/2018   Procedure: TRANSESOPHAGEAL ECHOCARDIOGRAM (TEE);  Surgeon: [name], MD;  Location: [facility] ORS;  Service: Cardiovascular;  Laterality: N/A;   TONSILLECTOMY AND ADENOIDECTOMY     As a child   TUNNELED VENOUS CATHETER PLACEMENT     removed    Family History  Problem Relation Age of Onset   Diabetes Mother    Heart disease Mother    Cancer Father        Prostate CA, Anal cancer    Dementia Father    Diabetes Brother    Healthy Brother    Healthy Brother     Social History   Socioeconomic History   Marital status: Married    Spouse name: Neoma Laming   Number of children: 0   Years of education: Not on file   Highest education level: Master's degree (e.g., MA, MS, MEng, MEd, MSW, MBA)  Occupational History   Occupation: disabled    Comment: multiple medical problems - first approved for uncontrolled DM, but now has heart disease and Myasthenia Gravis   Tobacco Use   Smoking status: Never    Smokeless tobacco: Never   Tobacco comments:    smoking cessation materials not required  Vaping Use   Vaping Use: Never used  Substance and Sexual Activity   Alcohol use: Not Currently    Alcohol/week: 0.0 standard drinks   Drug use: No   Sexual activity: Not Currently    Partners: Female  Other Topics Concern   Not on file  Social History Narrative   Not on file   Social Determinants of Health   Financial Resource Strain: Low Risk    Difficulty of Paying Living Expenses: Not very hard  Food Insecurity: No Food Insecurity   Worried About Running Out of Food in the Last Year: Never  true   Ran Out of Food in the Last Year: Never true  Transportation Needs: No Transportation Needs   Lack of Transportation (Medical): No   Lack of Transportation (Non-Medical): No  Physical Activity: Insufficiently Active   Days of Exercise per Week: 7 days   Minutes of Exercise per Session: 20 min  Stress: Stress Concern Present   Feeling of Stress : To some extent  Social Connections: Socially Integrated   Frequency of Communication with Friends and Family: More than three times a week   Frequency of Social Gatherings with Friends and Family: Three times a week   Attends Religious Services: More than 4 times per year   Active Member of Clubs or Organizations: Yes   Attends Music therapist: More than 4 times per year   Marital Status: Married  Human resources officer Violence: Not At Risk   Fear of Current or Ex-Partner: No   Emotionally Abused: No   Physically Abused: No   Sexually Abused: No    Outpatient Medications Prior to Visit  Medication Sig Dispense Refill   albuterol (PROVENTIL HFA;VENTOLIN HFA) 108 (90 Base) MCG/ACT inhaler Inhale 2 puffs into the lungs every 6 (six) hours as needed for wheezing or shortness of breath.      albuterol (PROVENTIL) (2.5 MG/3ML) 0.083% nebulizer solution Take 2.5 mg by nebulization every 6 (six) hours as needed for wheezing or shortness of  breath.      aspirin EC 81 MG tablet Take 1 tablet (81 mg total) by mouth daily. 90 tablet 3   clopidogrel (PLAVIX) 75 MG tablet TAKE 1 TABLET(75 MG) BY MOUTH DAILY WITH BREAKFAST 30 tablet 6   dexlansoprazole (DEXILANT) 60 MG capsule Take 1 capsule (60 mg total) by mouth daily. 30 capsule 11   DULoxetine (CYMBALTA) 30 MG capsule Take 1 capsule (30 mg total) by mouth daily. 90 capsule 1   DULoxetine (CYMBALTA) 60 MG capsule Take 1 capsule (60 mg total) by mouth daily. 90 capsule 1   ezetimibe (ZETIA) 10 MG tablet TAKE 1 TABLET(10 MG) BY MOUTH DAILY 90 tablet 3   Fluticasone-Umeclidin-Vilant (TRELEGY ELLIPTA) 100-62.5-25 MCG/INH AEPB Inhale 1 each into the lungs daily. In place of breo 60 each 2   furosemide (LASIX) 20 MG tablet TAKE 1 TABLET BY MOUTH DAILY AS NEEDED FOR WEIGHT GAIN OF 2 POUNDS OVERNIGHT OR 5 POUNDS PER 1 WEEK 90 tablet 0   gabapentin (NEURONTIN) 300 MG capsule Take 1 capsule (300 mg total) by mouth 3 (three) times daily. 90 capsule 1   gabapentin (NEURONTIN) 800 MG tablet Take 800 mg by mouth at bedtime.     insulin lispro (HUMALOG) 100 UNIT/ML KiwkPen Inject 7-8 Units into the skin 3 (three) times daily. Reported on 12/02/2015/ sliding scale 1 unit for every 7 units of carbs; and 1 unit for every 20 above 120     Insulin Pen Needle 32G X 4 MM MISC Inject 1 each into the skin as needed.     JARDIANCE 10 MG TABS tablet TAKE 1 TABLET(10 MG) BY MOUTH DAILY BEFORE BREAKFAST 30 tablet 4   LANTUS SOLOSTAR 100 UNIT/ML Solostar Pen Inject 23-29 Units into the skin daily at 10 pm.  0   levocetirizine (XYZAL) 5 MG tablet TAKE 1 TABLET(5 MG) BY MOUTH EVERY EVENING 30 tablet 3   levothyroxine (SYNTHROID) 50 MCG tablet TAKE 1 TABLET BY MOUTH DAILY MONDAY THROUGH SATURDAY AND 2 TABLETS EVERY SUNDAY. TAKE BEFORE BREAKFAST. RECHECK LEVEL IN 6 WEEKS 32 tablet 0  loratadine (CLARITIN) 10 MG tablet Take 10 mg by mouth daily.      metFORMIN (GLUCOPHAGE) 1000 MG tablet Take 1,000 mg by mouth 2 (two)  times daily with a meal.     metoprolol succinate (TOPROL-XL) 100 MG 24 hr tablet TAKE 1 TABLET(100 MG) BY MOUTH DAILY WITH OR IMMEDIATELY FOLLOWING A MEAL 90 tablet 2   montelukast (SINGULAIR) 10 MG tablet Take 1 tablet by mouth at bedtime.     Multiple Vitamins-Minerals (MENS MULTI VITAMIN & MINERAL PO) Take 1 tablet by mouth in the morning and at bedtime.     naloxone (NARCAN) nasal spray 4 mg/0.1 mL For excess sedation from opioids 1 kit 2   nitroGLYCERIN (NITROSTAT) 0.3 MG SL tablet Place 1 tablet (0.3 mg total) under the tongue every 5 (five) minutes as needed for chest pain. Maximum 3 doses. 25 tablet 3   Oxycodone HCl 10 MG TABS Take 1 tablet (10 mg total) by mouth every 8 (eight) hours. 90 tablet 0   OZEMPIC, 1 MG/DOSE, 4 MG/3ML SOPN Inject 1 mg into the skin once a week.     rosuvastatin (CRESTOR) 5 MG tablet Take 5 mg by mouth daily.  0   tamsulosin (FLOMAX) 0.4 MG CAPS capsule TAKE 2 CAPSULES BY MOUTH DAILY 60 capsule 2   No facility-administered medications prior to visit.    Allergies  Allergen Reactions   Azathioprine Other (See Comments)    Azathioprine hypersensitivity reaction - Symptoms mimicking sepsis - was hospitalized   Novolog [Insulin Aspart] Hives    Review of Systems  Constitutional:  Positive for fatigue. Negative for chills and fever.  HENT:  Positive for ear pain and sore throat. Negative for congestion, ear discharge, rhinorrhea, sinus pressure and sinus pain.   Respiratory:  Positive for cough, chest tightness and wheezing. Negative for shortness of breath.   Cardiovascular:  Negative for chest pain.  Gastrointestinal:  Negative for abdominal pain, diarrhea, nausea and vomiting.  Musculoskeletal:  Positive for arthralgias. Negative for myalgias.  Skin: Negative.   Neurological:  Negative for dizziness and headaches.      Objective:    Physical Exam Constitutional:      Appearance: Normal appearance.  HENT:     Head: Normocephalic and atraumatic.      Right Ear: Tympanic membrane, ear canal and external ear normal.     Left Ear: Tympanic membrane, ear canal and external ear normal.     Nose: Nose normal.     Mouth/Throat:     Mouth: Mucous membranes are moist.     Pharynx: Oropharynx is clear.  Eyes:     Conjunctiva/sclera: Conjunctivae normal.  Cardiovascular:     Rate and Rhythm: Normal rate and regular rhythm.  Pulmonary:     Effort: Pulmonary effort is normal.     Breath sounds: Wheezing present. No rhonchi or rales.     Comments: Inspiratory and expiratory wheezing throughout  Musculoskeletal:     Right lower leg: No edema.     Left lower leg: No edema.  Skin:    General: Skin is warm and dry.  Neurological:     General: No focal deficit present.     Mental Status: He is alert. Mental status is at baseline.  Psychiatric:        Mood and Affect: Mood normal.        Behavior: Behavior normal.    BP 112/64    Pulse 90    Temp 98.2 F (36.8 C) (Oral)  Resp 18    Ht $R'5\' 7"'Dz$  (1.702 m)    Wt 206 lb 9.6 oz (93.7 kg)    SpO2 96%    BMI 32.36 kg/m  Wt Readings from Last 3 Encounters:  10/10/21 206 lb 9.6 oz (93.7 kg)  09/23/21 206 lb 11.2 oz (93.8 kg)  09/09/21 210 lb (95.3 kg)    Health Maintenance Due  Topic Date Due   Zoster Vaccines- Shingrix (1 of 2) Never done   URINE MICROALBUMIN  05/04/2020   OPHTHALMOLOGY EXAM  08/01/2020    There are no preventive care reminders to display for this patient.   Lab Results  Component Value Date   TSH 2.18 09/23/2021   Lab Results  Component Value Date   WBC 8.6 09/23/2021   HGB 12.7 (L) 09/23/2021   HCT 39.7 09/23/2021   MCV 87.3 09/23/2021   PLT 185 09/23/2021   Lab Results  Component Value Date   NA 139 09/23/2021   K 4.4 09/23/2021   CO2 28 09/23/2021   GLUCOSE 174 (H) 09/23/2021   BUN 25 09/23/2021   CREATININE 1.16 09/23/2021   BILITOT 0.4 09/23/2021   ALKPHOS 99 07/17/2021   AST 18 09/23/2021   ALT 21 09/23/2021   PROT 6.1 09/23/2021   ALBUMIN  4.2 07/17/2021   CALCIUM 9.1 09/23/2021   ANIONGAP 8 12/25/2020   EGFR 71 09/23/2021   Lab Results  Component Value Date   CHOL 92 (L) 07/17/2021   Lab Results  Component Value Date   HDL 28 (L) 07/17/2021   Lab Results  Component Value Date   LDLCALC 39 07/17/2021   Lab Results  Component Value Date   TRIG 145 07/17/2021   Lab Results  Component Value Date   CHOLHDL 3.3 07/17/2021   Lab Results  Component Value Date   HGBA1C 8.3 06/04/2021       Assessment & Plan:   1. Bronchitis/Body aches/Acute cough: Test for COVID/flu today due to symptoms. As he has been having worsening symptoms for 2 weeks, he will be treated with antibiotics and a short steroid course. Discussed how this will affect his blood sugars. Continue Trelegy daily, Nebulizer every 4-6 hours as needed, refills today. For cough and chest congestion, recommend Mucinex [phone] mg in the morning and a cough suppressant at night. Follow up already scheduled in 3 weeks.  - albuterol (PROVENTIL) (2.5 MG/3ML) 0.083% nebulizer solution; Take 3 mLs (2.5 mg total) by nebulization every 6 (six) hours as needed for wheezing or shortness of breath.  Dispense: 75 mL; Refill: 2 - amoxicillin-clavulanate (AUGMENTIN) 875-125 MG tablet; Take 1 tablet by mouth 2 (two) times daily for 5 days.  Dispense: 10 tablet; Refill: 0 - predniSONE (DELTASONE) 20 MG tablet; Take 1 tablet (20 mg total) by mouth daily with breakfast for 3 days, THEN 0.5 tablets (10 mg total) daily with breakfast for 3 days.  Dispense: 4.5 tablet; Refill: 0 - POCT Influenza A/B - Novel Coronavirus, NAA ([facility]) - benzonatate (TESSALON) 100 MG capsule; Take 1 capsule (100 mg total) by mouth 2 (two) times daily as needed for cough.  Dispense: 20 capsule; Refill: 0  Teodora Medici, DO

## 2021-10-10 NOTE — Patient Instructions (Addendum)
It was great seeing you today!  Plan discussed at today's visit: -Will let you know COVID and flu swab results -Take antibiotic twice a day for 5 days -Prednisone 20 mg for 3 days, then 10 mg for 3 days  -Continue inhalers, nebulizer refilled today -Can take [phone] mg of Mucinex in the morning to help cough up congestion, then can take tessalon perles at night to help suppress cough  Follow up in: on the 1/23  Take care and let us know if you have any questions or concerns prior to your next visit.  Dr. Rosana Berger

## 2021-10-11 ENCOUNTER — Encounter: Payer: Self-pay | Admitting: Family Medicine

## 2021-10-11 LAB — NOVEL CORONAVIRUS, NAA: SARS-CoV-2, NAA: DETECTED — AB

## 2021-10-11 LAB — SARS-COV-2, NAA 2 DAY TAT

## 2021-10-12 ENCOUNTER — Other Ambulatory Visit: Payer: Self-pay | Admitting: Cardiovascular Disease

## 2021-10-13 ENCOUNTER — Ambulatory Visit: Payer: Self-pay

## 2021-10-13 ENCOUNTER — Telehealth: Payer: Self-pay | Admitting: *Deleted

## 2021-10-13 NOTE — Telephone Encounter (Addendum)
Positive for COVID this morning, seeking clinical advice regarding isolation, patient states respiratory infection symptoms have not improved since 10/10/2021. Requesting medication and advice   Left message to call back about symptoms.  Pt called and stated that he would like a call back from the office regarding positive covid result. Pt would like a call back from the nurse regarding covid. [place] call.

## 2021-10-13 NOTE — Telephone Encounter (Signed)
Patient called reporting that he has a CT scan Friday, but he was diagnosed with COVID last Friday 1/6 So he is asking if his CT needs to be rescheduled. Please advise

## 2021-10-14 ENCOUNTER — Encounter: Payer: Self-pay | Admitting: Oncology

## 2021-10-16 ENCOUNTER — Ambulatory Visit: Payer: Medicare Other | Admitting: Podiatry

## 2021-10-17 ENCOUNTER — Other Ambulatory Visit: Payer: Medicare Other

## 2021-10-21 ENCOUNTER — Telehealth: Payer: Self-pay | Admitting: *Deleted

## 2021-10-21 ENCOUNTER — Ambulatory Visit (INDEPENDENT_AMBULATORY_CARE_PROVIDER_SITE_OTHER): Payer: Medicare Other | Admitting: Unknown Physician Specialty

## 2021-10-21 ENCOUNTER — Ambulatory Visit: Payer: Medicare Other | Admitting: Anesthesiology

## 2021-10-21 VITALS — BP 98/60 | HR 98 | Temp 98.0°F | Resp 16 | Ht 67.0 in | Wt 203.2 lb

## 2021-10-21 DIAGNOSIS — T7491XA Unspecified adult maltreatment, confirmed, initial encounter: Secondary | ICD-10-CM | POA: Diagnosis not present

## 2021-10-21 DIAGNOSIS — Z794 Long term (current) use of insulin: Secondary | ICD-10-CM

## 2021-10-21 DIAGNOSIS — J4540 Moderate persistent asthma, uncomplicated: Secondary | ICD-10-CM | POA: Diagnosis not present

## 2021-10-21 DIAGNOSIS — E1142 Type 2 diabetes mellitus with diabetic polyneuropathy: Secondary | ICD-10-CM | POA: Diagnosis not present

## 2021-10-21 LAB — POCT GLYCOSYLATED HEMOGLOBIN (HGB A1C): Hemoglobin A1C: 7.6 % — AB (ref 4.0–5.6)

## 2021-10-21 MED ORDER — BUDESONIDE-FORMOTEROL FUMARATE 160-4.5 MCG/ACT IN AERO: 2.0000 | INHALATION_SPRAY | Freq: Two times a day (BID) | RESPIRATORY_TRACT | 3 refills | Status: DC

## 2021-10-21 NOTE — Chronic Care Management (AMB) (Signed)
°  Chronic Care Management   Note  10/21/2021 Name: [name] MRN: [id number] DOB: [birth year]  [name] is a 61 y.o. year old male who is a primary care patient of [name], MD. [name] is currently enrolled in care management services. An additional referral for Licensed Clinical SW was placed.   Follow up plan: Telephone appointment with care management team member scheduled for: 10/31/2021  Julian Hy, [facility]  Direct Dial: [phone]

## 2021-10-21 NOTE — Assessment & Plan Note (Signed)
Using inhaler BID.  Add Symbicort 160 for winter months to decrease use of Albuterol
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
## 2021-10-21 NOTE — Assessment & Plan Note (Signed)
Hgb A1C is 7.6 which is improved despite Methylprednisolone.  Has an appt pending with Endocrinology

## 2021-10-21 NOTE — Assessment & Plan Note (Signed)
Hgb A1C is 7.6.  Pt reports this is much improved.  Working with Endocrinology and has another appt soon. Also note has just finished a course of Metylprednisolone

## 2021-10-21 NOTE — Chronic Care Management (AMB) (Signed)
°  Chronic Care Management   Outreach Note  10/21/2021 Name: [name] MRN: [id number] DOB: [birth year]  [name] is a 61 y.o. year old male who is a primary care patient of [name], MD. I reached out to [name] by phone today in response to a referral sent by [name]'s primary care provider.  An unsuccessful telephone outreach was attempted today. The patient was referred to the case management team for assistance with care management and care coordination.   Follow Up Plan: A HIPAA compliant phone message was left for the patient providing contact information and requesting a return call.  If patient returns call to provider office, please advise to call Embedded Care Management Care Guide Lauree Yurick at [place]  Direct Dial: [phone]

## 2021-10-27 ENCOUNTER — Other Ambulatory Visit: Payer: Self-pay

## 2021-10-27 ENCOUNTER — Encounter: Payer: Self-pay | Admitting: Internal Medicine

## 2021-10-27 ENCOUNTER — Ambulatory Visit (INDEPENDENT_AMBULATORY_CARE_PROVIDER_SITE_OTHER): Payer: Medicare Other | Admitting: Internal Medicine

## 2021-10-27 VITALS — BP 116/62 | Temp 97.5°F | Resp 16 | Ht 67.0 in | Wt 205.4 lb

## 2021-10-27 DIAGNOSIS — R634 Abnormal weight loss: Secondary | ICD-10-CM

## 2021-10-27 DIAGNOSIS — Z8616 Personal history of COVID-19: Secondary | ICD-10-CM | POA: Diagnosis not present

## 2021-10-27 DIAGNOSIS — I951 Orthostatic hypotension: Secondary | ICD-10-CM

## 2021-10-27 DIAGNOSIS — T7491XD Unspecified adult maltreatment, confirmed, subsequent encounter: Secondary | ICD-10-CM

## 2021-10-27 NOTE — Patient Instructions (Addendum)
It was great seeing you today!  Plan discussed at today's visit: -When changing position, doing so slowly and carefully, wait a few seconds before you start walking.  -Weight stable, continue protein shakes -Social work appointment next week  Follow up in: 3 months   Take care and let us know if you have any questions or concerns prior to your next visit.  Dr. Rosana Berger  Orthostatic Hypotension Blood pressure is a measurement of how strongly, or weakly, your circulating blood is pressing against the walls of your arteries. Orthostatic hypotension is a drop in blood pressure that can happen when you change positions, such as when you go from lying down to standing. Arteries are blood vessels that carry blood from your heart throughout your body. When blood pressure is too low, you may not get enough blood to your brain or to the rest of your organs. Orthostatic hypotension can cause light-headedness, sweating, rapid heartbeat, blurred vision, and fainting. These symptoms require further investigation into the cause. What are the causes? Orthostatic hypotension can be caused by many things, including: Sudden changes in posture, such as standing up quickly after you have been sitting or lying down. Loss of blood (anemia) or loss of body fluids (dehydration). Heart problems, neurologic problems, or hormone problems. Pregnancy. Aging. The risk for this condition increases as you get older. Severe infection (sepsis). Certain medicines, such as medicines for high blood pressure or medicines that make the body lose excess fluids (diuretics). What are the signs or symptoms? Symptoms of this condition may include: Weakness, light-headedness, or dizziness. Sweating. Blurred vision. Tiredness (fatigue). Rapid heartbeat. Fainting, in severe cases. How is this diagnosed? This condition is diagnosed based on: Your symptoms and medical history. Your blood pressure measurements. Your health care  provider will check your blood pressure when you are: Lying down. Sitting. Standing. A blood pressure reading is recorded as two numbers, such as "120 over 80" (or 120/80). The first ("top") number is called the systolic pressure. It is a measure of the pressure in your arteries as your heart beats. The second ("bottom") number is called the diastolic pressure. It is a measure of the pressure in your arteries when your heart relaxes between beats. Blood pressure is measured in a unit called mmHg. Healthy blood pressure for most adults is 120/80 mmHg. Orthostatic hypotension is defined as a 20 mmHg drop in systolic pressure or a 10 mmHg drop in diastolic pressure within 3 minutes of standing. Other information or tests that may be used to diagnose orthostatic hypotension include: Your other vital signs, such as your heart rate and temperature. Blood tests. An electrocardiogram (ECG) or echocardiogram. A Holter monitor. This is a device you wear that records your heart rhythm continuously, usually for 24-48 hours. Tilt table test. For this test, you will be safely secured to a table that moves you from a lying position to an upright position. Your heart rhythm and blood pressure will be monitored during the test. How is this treated? This condition may be treated by: Changing your diet. This may involve eating more salt (sodium) or drinking more water. Changing the dosage of certain medicines you are taking that might be lowering your blood pressure. Correcting the underlying reason for the orthostatic hypotension. Wearing compression stockings. Taking medicines to raise your blood pressure. Avoiding actions that trigger symptoms. Follow these instructions at home: Medicines Take over-the-counter and prescription medicines only as told by your health care provider. Follow instructions from your health care provider about  changing the dosage of your current medicines, if this applies. Do not stop  or adjust any of your medicines on your own. Eating and drinking  Drink enough fluid to keep your urine pale yellow. Eat extra salt only as directed. Do not add extra salt to your diet unless advised by your health care provider. Eat frequent, small meals. Avoid standing up suddenly after eating. General instructions  Get up slowly from lying down or sitting positions. This gives your blood pressure a chance to adjust. Avoid hot showers and excessive heat as directed by your health care provider. Engage in regular physical activity as directed by your health care provider. If you have compression stockings, wear them as told. Keep all follow-up visits. This is important. Contact a health care provider if: You have a fever for more than 2-3 days. You feel more thirsty than usual. You feel dizzy or weak. Get help right away if: You have chest pain. You have a fast or irregular heartbeat. You become sweaty or feel light-headed. You feel short of breath. You faint. You have any symptoms of a stroke. "BE FAST" is an easy way to remember the main warning signs of a stroke: B - Balance. Signs are dizziness, sudden trouble walking, or loss of balance. E - Eyes. Signs are trouble seeing or a sudden change in vision. F - Face. Signs are sudden weakness or numbness of the face, or the face or eyelid drooping on one side. A - Arms. Signs are weakness or numbness in an arm. This happens suddenly and usually on one side of the body. S - Speech. Signs are sudden trouble speaking, slurred speech, or trouble understanding what people say. T - Time. Time to call emergency services. Write down what time symptoms started. You have other signs of a stroke, such as: A sudden, severe headache with no known cause. Nausea or vomiting. Seizure. These symptoms may represent a serious problem that is an emergency. Do not wait to see if the symptoms will go away. Get medical help right away. Call your local  emergency services (911 in the [place] Do not drive yourself to the hospital. Summary Orthostatic hypotension is a sudden drop in blood pressure. It can cause light-headedness, sweating, rapid heartbeat, blurred vision, and fainting. Orthostatic hypotension can be diagnosed by having your blood pressure taken while lying down, sitting, and then standing. Treatment may involve changing your diet, wearing compression stockings, sitting up slowly, adjusting your medicines, or correcting the underlying reason for the orthostatic hypotension. Get help right away if you have chest pain, a fast or irregular heartbeat, or symptoms of a stroke. This information is not intended to replace advice given to you by your health care provider. Make sure you discuss any questions you have with your health care provider. Document Revised: 12/05/2020 Document Reviewed: 12/05/2020 Elsevier Patient Education  [place].
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
## 2021-10-31 ENCOUNTER — Ambulatory Visit (INDEPENDENT_AMBULATORY_CARE_PROVIDER_SITE_OTHER): Payer: Medicare Other | Admitting: *Deleted

## 2021-10-31 ENCOUNTER — Other Ambulatory Visit: Payer: Self-pay

## 2021-10-31 ENCOUNTER — Ambulatory Visit
Admission: RE | Admit: 2021-10-31 | Discharge: 2021-10-31 | Disposition: A | Discharge status: Home / Self Care | Payer: Medicare Other | Source: Ambulatory Visit | Attending: Oncology | Admitting: Oncology

## 2021-10-31 DIAGNOSIS — I2510 Atherosclerotic heart disease of native coronary artery without angina pectoris: Secondary | ICD-10-CM | POA: Insufficient documentation

## 2021-10-31 DIAGNOSIS — R634 Abnormal weight loss: Secondary | ICD-10-CM | POA: Insufficient documentation

## 2021-10-31 DIAGNOSIS — T7491XD Unspecified adult maltreatment, confirmed, subsequent encounter: Secondary | ICD-10-CM

## 2021-10-31 DIAGNOSIS — I700 Atherosclerosis of aorta: Secondary | ICD-10-CM | POA: Insufficient documentation

## 2021-10-31 DIAGNOSIS — F411 Generalized anxiety disorder: Secondary | ICD-10-CM

## 2021-10-31 DIAGNOSIS — E1142 Type 2 diabetes mellitus with diabetic polyneuropathy: Secondary | ICD-10-CM

## 2021-10-31 DIAGNOSIS — I951 Orthostatic hypotension: Secondary | ICD-10-CM

## 2021-10-31 LAB — POCT I-STAT CREATININE: Creatinine, Ser: 1.3 mg/dL — ABNORMAL HIGH (ref 0.61–1.24)

## 2021-10-31 MED ORDER — IOHEXOL 300 MG/ML  SOLN
100.0000 mL | Freq: Once | INTRAMUSCULAR | Status: AC | PRN
  Administered 2021-10-31: 100 mL via INTRAVENOUS
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
## 2021-11-02 ENCOUNTER — Other Ambulatory Visit: Payer: Self-pay | Admitting: Internal Medicine
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
## 2021-11-04 DIAGNOSIS — E1142 Type 2 diabetes mellitus with diabetic polyneuropathy: Secondary | ICD-10-CM

## 2021-11-04 DIAGNOSIS — Z794 Long term (current) use of insulin: Secondary | ICD-10-CM

## 2021-11-06 ENCOUNTER — Telehealth: Payer: Self-pay | Admitting: Anesthesiology

## 2021-11-06 ENCOUNTER — Telehealth: Payer: Self-pay | Admitting: *Deleted

## 2021-11-06 MED ORDER — OXYCODONE HCL 10 MG PO TABS: 10.0000 mg | ORAL_TABLET | Freq: Three times a day (TID) | ORAL | 0 refills | Status: DC

## 2021-11-06 NOTE — Telephone Encounter (Signed)
Patient informed that Oxycodone was sent to pharmacy.

## 2021-11-06 NOTE — Telephone Encounter (Signed)
Note placed on Dr Adams desk.  

## 2021-11-10 ENCOUNTER — Ambulatory Visit (HOSPITAL_BASED_OUTPATIENT_CLINIC_OR_DEPARTMENT_OTHER): Payer: Medicare Other | Admitting: Anesthesiology

## 2021-11-10 ENCOUNTER — Encounter: Payer: Self-pay | Admitting: Anesthesiology

## 2021-11-10 ENCOUNTER — Ambulatory Visit
Admission: RE | Admit: 2021-11-10 | Discharge: 2021-11-10 | Disposition: A | Discharge status: Home / Self Care | Payer: Medicare Other | Source: Ambulatory Visit | Attending: Anesthesiology | Admitting: Anesthesiology

## 2021-11-10 ENCOUNTER — Other Ambulatory Visit: Payer: Self-pay

## 2021-11-10 ENCOUNTER — Other Ambulatory Visit: Payer: Self-pay | Admitting: Anesthesiology

## 2021-11-10 VITALS — BP 120/96 | HR 93 | Temp 96.2°F | Resp 11 | Ht 67.0 in | Wt 201.0 lb

## 2021-11-10 DIAGNOSIS — M542 Cervicalgia: Secondary | ICD-10-CM | POA: Insufficient documentation

## 2021-11-10 DIAGNOSIS — M5136 Other intervertebral disc degeneration, lumbar region: Secondary | ICD-10-CM | POA: Insufficient documentation

## 2021-11-10 DIAGNOSIS — M5432 Sciatica, left side: Secondary | ICD-10-CM | POA: Insufficient documentation

## 2021-11-10 DIAGNOSIS — M47817 Spondylosis without myelopathy or radiculopathy, lumbosacral region: Secondary | ICD-10-CM

## 2021-11-10 DIAGNOSIS — A6923 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: Secondary | ICD-10-CM | POA: Diagnosis present

## 2021-11-10 DIAGNOSIS — R29898 Other symptoms and signs involving the musculoskeletal system: Secondary | ICD-10-CM

## 2021-11-10 DIAGNOSIS — F1190 Opioid use, unspecified, uncomplicated: Secondary | ICD-10-CM | POA: Diagnosis present

## 2021-11-10 DIAGNOSIS — M545 Low back pain, unspecified: Secondary | ICD-10-CM | POA: Diagnosis present

## 2021-11-10 DIAGNOSIS — G894 Chronic pain syndrome: Secondary | ICD-10-CM

## 2021-11-10 DIAGNOSIS — M51369 Other intervertebral disc degeneration, lumbar region without mention of lumbar back pain or lower extremity pain: Secondary | ICD-10-CM

## 2021-11-10 DIAGNOSIS — R52 Pain, unspecified: Secondary | ICD-10-CM | POA: Insufficient documentation

## 2021-11-10 DIAGNOSIS — M4716 Other spondylosis with myelopathy, lumbar region: Secondary | ICD-10-CM | POA: Insufficient documentation

## 2021-11-10 MED ORDER — OXYCODONE HCL 10 MG PO TABS: 10.0000 mg | ORAL_TABLET | Freq: Three times a day (TID) | ORAL | 0 refills | Status: DC

## 2021-11-10 MED ORDER — IOHEXOL 180 MG/ML  SOLN
INTRAMUSCULAR | Status: AC
  Filled 2021-11-10: qty 20

## 2021-11-10 MED ORDER — TRIAMCINOLONE ACETONIDE 40 MG/ML IJ SUSP
INTRAMUSCULAR | Status: AC
  Filled 2021-11-10: qty 1

## 2021-11-10 MED ORDER — SODIUM CHLORIDE 0.9% FLUSH
10.0000 mL | Freq: Once | INTRAVENOUS | Status: AC
  Administered 2021-11-10: 5 mL

## 2021-11-10 MED ORDER — ROPIVACAINE HCL 2 MG/ML IJ SOLN
INTRAMUSCULAR | Status: AC
  Filled 2021-11-10: qty 20

## 2021-11-10 MED ORDER — ROPIVACAINE HCL 2 MG/ML IJ SOLN
10.0000 mL | Freq: Once | INTRAMUSCULAR | Status: AC
  Administered 2021-11-10: 1 mL via EPIDURAL

## 2021-11-10 MED ORDER — LIDOCAINE HCL (PF) 1 % IJ SOLN
5.0000 mL | Freq: Once | INTRAMUSCULAR | Status: AC
  Administered 2021-11-10: 5 mL via SUBCUTANEOUS

## 2021-11-10 MED ORDER — TRIAMCINOLONE ACETONIDE 40 MG/ML IJ SUSP
40.0000 mg | Freq: Once | INTRAMUSCULAR | Status: AC
  Administered 2021-11-10: 40 mg

## 2021-11-10 MED ORDER — CYCLOBENZAPRINE HCL 10 MG PO TABS: 10.0000 mg | ORAL_TABLET | Freq: Two times a day (BID) | ORAL | 5 refills | Status: DC

## 2021-11-10 MED ORDER — IOPAMIDOL (ISOVUE-M 200) INJECTION 41%: 20.0000 mL | Freq: Once | INTRAMUSCULAR | Status: DC | PRN

## 2021-11-10 MED ORDER — SODIUM CHLORIDE (PF) 0.9 % IJ SOLN
INTRAMUSCULAR | Status: AC
  Filled 2021-11-10: qty 10

## 2021-11-10 MED ORDER — LIDOCAINE HCL (PF) 1 % IJ SOLN
INTRAMUSCULAR | Status: AC
  Filled 2021-11-10: qty 5

## 2021-11-10 NOTE — Patient Instructions (Signed)

## 2021-11-10 NOTE — Progress Notes (Signed)
Nursing Pain Medication Assessment:  Safety precautions to be maintained throughout the outpatient stay will include: orient to surroundings, keep bed in low position, maintain call bell within reach at all times, provide assistance with transfer out of bed and ambulation.  Medication Inspection Compliance: Pill count conducted under aseptic conditions, in front of the patient. Neither the pills nor the bottle was removed from the patient's sight at any time. Once count was completed pills were immediately returned to the patient in their original bottle.  Medication: Oxycodone IR Pill/Patch Count:  82 of 90 pills remain Pill/Patch Appearance: Markings consistent with prescribed medication Bottle Appearance: Standard pharmacy container. Clearly labeled. Filled Date: 02 / 02 / 2023 Last Medication intake:  Today

## 2021-11-10 NOTE — Progress Notes (Signed)
Subjective:  Patient ID: [name], male    DOB: [birth year]  Age: 62 y.o. MRN: [id number]  CC: Back Pain (Left, lower)   Procedure: L5-S1 epidural steroid under fluoroscopic guidance with no sedation  HPI [name] presents for reevaluation.  [name] continues to have left lower extremity sciatica symptoms that are recurrent in nature and has had previous epidurals for this in the past most recently in July of last year.  He generally gets nearly complete relief with the injection lasting about 2 to 3 months before he has recurrence of a similar type sciatica symptoms.  Unfortunately more conservative therapy has been ineffective and he generally requires the epidurals to keep the pain under good control.  He has tried physical therapy stretching strengthening exercises both at home and in clinic with limited success.  He still taking his opioid medications additionally for his diffuse body pain and low back pain and these continue to give him significant overall improvement but the epidural seems to be critical for the low back pain and sciatica component of his pain.  No changes are otherwise reported in his lower extremity function though he suffers from some lower extremity weakness especially with his myasthenia and no change in bowel or bladder function.  Outpatient Medications Prior to Visit  Medication Sig Dispense Refill   albuterol (PROVENTIL) (2.5 MG/3ML) 0.083% nebulizer solution Take 3 mLs (2.5 mg total) by nebulization every 6 (six) hours as needed for wheezing or shortness of breath. 75 mL 2   aspirin EC 81 MG tablet Take 1 tablet (81 mg total) by mouth daily. 90 tablet 3   benzonatate (TESSALON) 100 MG capsule Take 1 capsule (100 mg total) by mouth 2 (two) times daily as needed for cough. 20 capsule 0   budesonide-formoterol (SYMBICORT) 160-4.5 MCG/ACT inhaler Inhale 2 puffs into the lungs 2 (two) times daily. 1 each 3   clopidogrel (PLAVIX) 75 MG tablet TAKE 1  TABLET(75 MG) BY MOUTH DAILY WITH BREAKFAST 30 tablet 6   dexlansoprazole (DEXILANT) 60 MG capsule Take 1 capsule (60 mg total) by mouth daily. 30 capsule 11   DULoxetine (CYMBALTA) 30 MG capsule Take 1 capsule (30 mg total) by mouth daily. 90 capsule 1   DULoxetine (CYMBALTA) 60 MG capsule Take 1 capsule (60 mg total) by mouth daily. 90 capsule 1   ezetimibe (ZETIA) 10 MG tablet TAKE 1 TABLET(10 MG) BY MOUTH DAILY 90 tablet 3   furosemide (LASIX) 20 MG tablet TAKE 1 TABLET BY MOUTH DAILY AS NEEDED FOR WEIGHT GAIN OF 2 POUNDS OVERNIGHT OR 5 POUNDS PER WEEK 90 tablet 0   gabapentin (NEURONTIN) 300 MG capsule Take 1 capsule (300 mg total) by mouth 3 (three) times daily. 90 capsule 1   gabapentin (NEURONTIN) 800 MG tablet Take 800 mg by mouth at bedtime.     insulin lispro (HUMALOG) 100 UNIT/ML KiwkPen Inject 7-8 Units into the skin 3 (three) times daily. Reported on 12/02/2015/ sliding scale 1 unit for every 7 units of carbs; and 1 unit for every 20 above 120     Insulin Pen Needle 32G X 4 MM MISC Inject 1 each into the skin as needed.     JARDIANCE 10 MG TABS tablet TAKE 1 TABLET(10 MG) BY MOUTH DAILY BEFORE BREAKFAST 30 tablet 4   LANTUS SOLOSTAR 100 UNIT/ML Solostar Pen Inject 23-26 Units into the skin daily at 10 pm.  0   levocetirizine (XYZAL) 5 MG tablet TAKE 1 TABLET(5 MG)  BY MOUTH EVERY EVENING 30 tablet 3   levothyroxine (SYNTHROID) 50 MCG tablet TAKE 1 TABLET BY MOUTH DAILY MONDAY THROUGH SATURDAY AND 2 TABLETS EVERY SUNDAY, TAKE BEFORE BREAKFAST(RECHECK LEVELS IN 6 WEEKS) 102 tablet 0   loratadine (CLARITIN) 10 MG tablet Take 10 mg by mouth daily.      metFORMIN (GLUCOPHAGE) 1000 MG tablet Take 1,000 mg by mouth 2 (two) times daily with a meal.     metoprolol succinate (TOPROL-XL) 100 MG 24 hr tablet TAKE 1 TABLET(100 MG) BY MOUTH DAILY WITH OR IMMEDIATELY FOLLOWING A MEAL 90 tablet 2   montelukast (SINGULAIR) 10 MG tablet Take 1 tablet by mouth at bedtime.     Multiple Vitamins-Minerals  (MENS MULTI VITAMIN & MINERAL PO) Take 1 tablet by mouth in the morning and at bedtime.     naloxone (NARCAN) nasal spray 4 mg/0.1 mL For excess sedation from opioids 1 kit 2   nitroGLYCERIN (NITROSTAT) 0.3 MG SL tablet Place 1 tablet (0.3 mg total) under the tongue every 5 (five) minutes as needed for chest pain. Maximum 3 doses. 25 tablet 3   OZEMPIC, 1 MG/DOSE, 4 MG/3ML SOPN Inject 1 mg into the skin once a week.     rosuvastatin (CRESTOR) 5 MG tablet Take 5 mg by mouth daily.  0   tamsulosin (FLOMAX) 0.4 MG CAPS capsule TAKE 2 CAPSULES BY MOUTH DAILY 60 capsule 2   Oxycodone HCl 10 MG TABS Take 1 tablet (10 mg total) by mouth every 8 (eight) hours. 90 tablet 0   No facility-administered medications prior to visit.    Review of Systems CNS: No confusion or sedation Cardiac: No angina or palpitations GI: No abdominal pain or constipation Constitutional: No nausea vomiting fevers or chills  Objective:  BP (!) 120/96    Pulse 93    Temp (!) 96.2 F (35.7 C) (Temporal)    Resp 11    Ht _0  (1.702 m)    Wt 201 lb (91.2 kg)    SpO2 97%    BMI 31.48 kg/m    BP Readings from Last 3 Encounters:  11/10/21 (!) 120/96  10/27/21 116/62  10/21/21 98/60     Wt Readings from Last 3 Encounters:  11/10/21 201 lb (91.2 kg)  10/27/21 205 lb 6.4 oz (93.2 kg)  10/21/21 203 lb 3.2 oz (92.2 kg)     Physical Exam Pt is alert and oriented PERRL EOMI HEART IS RRR no murmur or rub LCTA no wheezing or rales MUSCULOSKELETAL reveals some paraspinous muscle tenderness but no overt trigger points in the lumbar region.  He does have 1 trigger point in the right mid proximal trapezius musculature.  He does complain of some chronic neck pain and stiffness and this recently has radiated into the bilateral shoulders.  He does occasionally have some upper extremity weakness.  Labs  Lab Results  Component Value Date   HGBA1C 7.6 (A) 10/21/2021   HGBA1C 8.3 06/04/2021   HGBA1C 7.9 01/29/2021   Lab  Results  Component Value Date   MICROALBUR 1.4 05/05/2019   LDLCALC 39 07/17/2021   CREATININE 1.30 (H) 10/31/2021    -------------------------------------------------------------------------------------------------------------------- Lab Results  Component Value Date   WBC 8.6 09/23/2021   HGB 12.7 (L) 09/23/2021   HCT 39.7 09/23/2021   PLT 185 09/23/2021   GLUCOSE 174 (H) 09/23/2021   CHOL 92 (L) 07/17/2021   TRIG 145 07/17/2021   HDL 28 (L) 07/17/2021   LDLDIRECT 36 07/17/2021   LDLCALC 39  07/17/2021   ALT 21 09/23/2021   AST 18 09/23/2021   NA 139 09/23/2021   K 4.4 09/23/2021   CL 103 09/23/2021   CREATININE 1.30 (H) 10/31/2021   BUN 25 09/23/2021   CO2 28 09/23/2021   TSH 2.18 09/23/2021   PSA 0.56 09/23/2021   INR 1.26 07/07/2018   HGBA1C 7.6 (A) 10/21/2021   MICROALBUR 1.4 05/05/2019    --------------------------------------------------------------------------------------------------------------------- DG PAIN CLINIC C-ARM 1-60 MIN NO REPORT  Result Date: 11/10/2021 Fluoro was used, but no Radiologist interpretation will be provided. Please refer to "NOTES" tab for provider progress note.    Assessment & Plan:   [name] was seen today for back pain.  Diagnoses and all orders for this visit:  Left leg weakness  DDD (degenerative disc disease), lumbar  Sciatica, left side  Low back pain at multiple sites  Lumbar spondylosis with myelopathy  Chronic, continuous use of opioids  Chronic pain syndrome  Cervicalgia  Lyme arthritis of multiple joints (HCC)  Facet arthritis of lumbosacral region  Other orders -     Discontinue: Oxycodone HCl 10 MG TABS; Take 1 tablet (10 mg total) by mouth every 8 (eight) hours. -     Oxycodone HCl 10 MG TABS; Take 1 tablet (10 mg total) by mouth 3 (three) times daily. -     Oxycodone HCl 10 MG TABS; Take 1 tablet (10 mg total) by mouth every 8 (eight) hours. -     cyclobenzaprine (FLEXERIL) 10 MG tablet; Take 1  tablet (10 mg total) by mouth 2 (two) times daily. -     triamcinolone acetonide (KENALOG-40) injection 40 mg -     sodium chloride flush (NS) 0.9 % injection 10 mL -     ropivacaine (PF) 2 mg/mL (0.2%) (NAROPIN) injection 10 mL -     lidocaine (PF) (XYLOCAINE) 1 % injection 5 mL -     iopamidol (ISOVUE-M) 41 % intrathecal injection 20 mL        ----------------------------------------------------------------------------------------------------------------------  Problem List Items Addressed This Visit       Unprioritized   Left leg weakness - Primary   Other Visit Diagnoses     DDD (degenerative disc disease), lumbar       Relevant Medications   Oxycodone HCl 10 MG TABS (Start on 12/08/2021)   Oxycodone HCl 10 MG TABS (Start on 01/07/2022)   cyclobenzaprine (FLEXERIL) 10 MG tablet   triamcinolone acetonide (KENALOG-40) injection 40 mg (Completed)   Sciatica, left side       Relevant Medications   cyclobenzaprine (FLEXERIL) 10 MG tablet   Low back pain at multiple sites       Relevant Medications   Oxycodone HCl 10 MG TABS (Start on 12/08/2021)   Oxycodone HCl 10 MG TABS (Start on 01/07/2022)   cyclobenzaprine (FLEXERIL) 10 MG tablet   triamcinolone acetonide (KENALOG-40) injection 40 mg (Completed)   Lumbar spondylosis with myelopathy       Relevant Medications   Oxycodone HCl 10 MG TABS (Start on 12/08/2021)   Oxycodone HCl 10 MG TABS (Start on 01/07/2022)   cyclobenzaprine (FLEXERIL) 10 MG tablet   triamcinolone acetonide (KENALOG-40) injection 40 mg (Completed)   Chronic, continuous use of opioids       Chronic pain syndrome       Relevant Medications   Oxycodone HCl 10 MG TABS (Start on 12/08/2021)   Oxycodone HCl 10 MG TABS (Start on 01/07/2022)   cyclobenzaprine (FLEXERIL) 10 MG tablet   triamcinolone acetonide (KENALOG-40)  injection 40 mg (Completed)   ropivacaine (PF) 2 mg/mL (0.2%) (NAROPIN) injection 10 mL (Completed)   lidocaine (PF) (XYLOCAINE) 1 % injection 5 mL  (Completed)   Cervicalgia       Lyme arthritis of multiple joints (HCC)       Relevant Medications   Oxycodone HCl 10 MG TABS (Start on 12/08/2021)   Oxycodone HCl 10 MG TABS (Start on 01/07/2022)   cyclobenzaprine (FLEXERIL) 10 MG tablet   triamcinolone acetonide (KENALOG-40) injection 40 mg (Completed)   Facet arthritis of lumbosacral region       Relevant Medications   Oxycodone HCl 10 MG TABS (Start on 12/08/2021)   Oxycodone HCl 10 MG TABS (Start on 01/07/2022)   cyclobenzaprine (FLEXERIL) 10 MG tablet   triamcinolone acetonide (KENALOG-40) injection 40 mg (Completed)         ----------------------------------------------------------------------------------------------------------------------  1. Left leg weakness Continue with core stretching strengthening exercises.  This been stable and I do not feel that further evaluation is indicated at this time.  2. DDD (degenerative disc disease), lumbar   3. Sciatica, left side We will proceed with a repeat epidural injection today.  I gone over the risks and benefits of the procedure with him in full detail all questions are answered.  4. Low back pain at multiple sites As above and continue core stretching strengthening exercises as reviewed once again today.  5. Lumbar spondylosis with myelopathy As above  6. Chronic, continuous use of opioids I have reviewed the [facility] practitioner database information and is appropriate for refills.  Is to be dated for the next 2 months.  No other changes in his pharmacologic regimen will be initiated.  He continues to derive good functional benefit from chronic opioid therapy as reviewed today.  7. Chronic pain syndrome As above  8. Cervicalgia We have talked about some exercises to help with his neck pain and he may be a candidate for trigger point injection if necessary in the next several weeks  9. Lyme arthritis of multiple joints ([place])   10. Facet arthritis of lumbosacral  region     ----------------------------------------------------------------------------------------------------------------------  I am having [name] "[name]" start on Oxycodone HCl and cyclobenzaprine. I am also having him maintain his insulin lispro, loratadine, gabapentin, metFORMIN, Lantus SoloStar, naloxone, rosuvastatin, Insulin Pen Needle, aspirin EC, nitroGLYCERIN, gabapentin, Multiple Vitamins-Minerals (MENS MULTI VITAMIN & MINERAL PO), tamsulosin, levocetirizine, metoprolol succinate, dexlansoprazole, montelukast, ezetimibe, Ozempic (1 MG/DOSE), clopidogrel, DULoxetine, DULoxetine, Jardiance, albuterol, benzonatate, furosemide, budesonide-formoterol, levothyroxine, and Oxycodone HCl. We administered triamcinolone acetonide, sodium chloride flush, ropivacaine (PF) 2 mg/mL (0.2%), and lidocaine (PF).   Meds ordered this encounter  Medications   DISCONTD: Oxycodone HCl 10 MG TABS    Sig: Take 1 tablet (10 mg total) by mouth every 8 (eight) hours.    Dispense:  90 tablet    Refill:  0   Oxycodone HCl 10 MG TABS    Sig: Take 1 tablet (10 mg total) by mouth 3 (three) times daily.    Dispense:  90 tablet    Refill:  0   Oxycodone HCl 10 MG TABS    Sig: Take 1 tablet (10 mg total) by mouth every 8 (eight) hours.    Dispense:  90 tablet    Refill:  0   cyclobenzaprine (FLEXERIL) 10 MG tablet    Sig: Take 1 tablet (10 mg total) by mouth 2 (two) times daily.    Dispense:  60 tablet    Refill:  5  triamcinolone acetonide (KENALOG-40) injection 40 mg   sodium chloride flush (NS) 0.9 % injection 10 mL   ropivacaine (PF) 2 mg/mL (0.2%) (NAROPIN) injection 10 mL   lidocaine (PF) (XYLOCAINE) 1 % injection 5 mL   iopamidol (ISOVUE-M) 41 % intrathecal injection 20 mL   Patient's Medications  New Prescriptions   CYCLOBENZAPRINE (FLEXERIL) 10 MG TABLET    Take 1 tablet (10 mg total) by mouth 2 (two) times daily.   OXYCODONE HCL 10 MG TABS    Take 1 tablet (10 mg total) by mouth 3  (three) times daily.  Previous Medications   ALBUTEROL (PROVENTIL) (2.5 MG/3ML) 0.083% NEBULIZER SOLUTION    Take 3 mLs (2.5 mg total) by nebulization every 6 (six) hours as needed for wheezing or shortness of breath.   ASPIRIN EC 81 MG TABLET    Take 1 tablet (81 mg total) by mouth daily.   BENZONATATE (TESSALON) 100 MG CAPSULE    Take 1 capsule (100 mg total) by mouth 2 (two) times daily as needed for cough.   BUDESONIDE-FORMOTEROL (SYMBICORT) 160-4.5 MCG/ACT INHALER    Inhale 2 puffs into the lungs 2 (two) times daily.   CLOPIDOGREL (PLAVIX) 75 MG TABLET    TAKE 1 TABLET(75 MG) BY MOUTH DAILY WITH BREAKFAST   DEXLANSOPRAZOLE (DEXILANT) 60 MG CAPSULE    Take 1 capsule (60 mg total) by mouth daily.   DULOXETINE (CYMBALTA) 30 MG CAPSULE    Take 1 capsule (30 mg total) by mouth daily.   DULOXETINE (CYMBALTA) 60 MG CAPSULE    Take 1 capsule (60 mg total) by mouth daily.   EZETIMIBE (ZETIA) 10 MG TABLET    TAKE 1 TABLET(10 MG) BY MOUTH DAILY   FUROSEMIDE (LASIX) 20 MG TABLET    TAKE 1 TABLET BY MOUTH DAILY AS NEEDED FOR WEIGHT GAIN OF 2 POUNDS OVERNIGHT OR 5 POUNDS PER WEEK   GABAPENTIN (NEURONTIN) 300 MG CAPSULE    Take 1 capsule (300 mg total) by mouth 3 (three) times daily.   GABAPENTIN (NEURONTIN) 800 MG TABLET    Take 800 mg by mouth at bedtime.   INSULIN LISPRO (HUMALOG) 100 UNIT/ML KIWKPEN    Inject 7-8 Units into the skin 3 (three) times daily. Reported on 12/02/2015/ sliding scale 1 unit for every 7 units of carbs; and 1 unit for every 20 above 120   INSULIN PEN NEEDLE 32G X 4 MM MISC    Inject 1 each into the skin as needed.   JARDIANCE 10 MG TABS TABLET    TAKE 1 TABLET(10 MG) BY MOUTH DAILY BEFORE BREAKFAST   LANTUS SOLOSTAR 100 UNIT/ML SOLOSTAR PEN    Inject 23-26 Units into the skin daily at 10 pm.   LEVOCETIRIZINE (XYZAL) 5 MG TABLET    TAKE 1 TABLET(5 MG) BY MOUTH EVERY EVENING   LEVOTHYROXINE (SYNTHROID) 50 MCG TABLET    TAKE 1 TABLET BY MOUTH DAILY MONDAY THROUGH SATURDAY AND 2  TABLETS EVERY SUNDAY, TAKE BEFORE BREAKFAST(RECHECK LEVELS IN 6 WEEKS)   LORATADINE (CLARITIN) 10 MG TABLET    Take 10 mg by mouth daily.    METFORMIN (GLUCOPHAGE) 1000 MG TABLET    Take 1,000 mg by mouth 2 (two) times daily with a meal.   METOPROLOL SUCCINATE (TOPROL-XL) 100 MG 24 HR TABLET    TAKE 1 TABLET(100 MG) BY MOUTH DAILY WITH OR IMMEDIATELY FOLLOWING A MEAL   MONTELUKAST (SINGULAIR) 10 MG TABLET    Take 1 tablet by mouth at bedtime.   MULTIPLE  VITAMINS-MINERALS (MENS MULTI VITAMIN & MINERAL PO)    Take 1 tablet by mouth in the morning and at bedtime.   NALOXONE (NARCAN) NASAL SPRAY 4 MG/0.1 ML    For excess sedation from opioids   NITROGLYCERIN (NITROSTAT) 0.3 MG SL TABLET    Place 1 tablet (0.3 mg total) under the tongue every 5 (five) minutes as needed for chest pain. Maximum 3 doses.   OZEMPIC, 1 MG/DOSE, 4 MG/3ML SOPN    Inject 1 mg into the skin once a week.   ROSUVASTATIN (CRESTOR) 5 MG TABLET    Take 5 mg by mouth daily.   TAMSULOSIN (FLOMAX) 0.4 MG CAPS CAPSULE    TAKE 2 CAPSULES BY MOUTH DAILY  Modified Medications   Modified Medication Previous Medication   OXYCODONE HCL 10 MG TABS Oxycodone HCl 10 MG TABS      Take 1 tablet (10 mg total) by mouth every 8 (eight) hours.    Take 1 tablet (10 mg total) by mouth every 8 (eight) hours.  Discontinued Medications   No medications on file   ----------------------------------------------------------------------------------------------------------------------  Follow-up: Return in about 2 months (around 01/08/2022) for evaluation, med refill.   Procedure: L5-S1 LESI with fluoroscopic guidance and no moderate sedation  NOTE: The risks, benefits, and expectations of the procedure have been discussed and explained to the patient who was understanding and in agreement with suggested treatment plan. No guarantees were made.  DESCRIPTION OF PROCEDURE: Lumbar epidural steroid injection with no IV Versed, EKG, blood pressure, pulse, and  pulse oximetry monitoring. The procedure was performed with the patient in the prone position under fluoroscopic guidance.  Sterile prep x3 was initiated and I then injected subcutaneous lidocaine to the overlying L5-S1 site after its fluoroscopic identifictation.  Using strict aseptic technique, I then advanced an 18-gauge Tuohy epidural needle in the midline using interlaminar approach via loss-of-resistance to saline technique. There was negative aspiration for heme or  CSF.  I then confirmed position with both AP and Lateral fluoroscan.  2 cc of contrast dye were injected and a  total of 5 mL of Preservative-Free normal saline mixed with 40 mg of Kenalog and 1cc Ropicaine 0.2 percent were injected incrementally via the  epidurally placed needle. The needle was removed. The patient tolerated the injection well and was convalesced and discharged to home in stable condition. Should the patient have any post procedure difficulty they have been instructed on how to contact us for assistance.    Molli Barrows, MD

## 2021-11-11 ENCOUNTER — Telehealth: Payer: Self-pay | Admitting: Cardiovascular Disease

## 2021-11-11 ENCOUNTER — Telehealth: Payer: Self-pay | Admitting: *Deleted

## 2021-11-11 MED ORDER — EZETIMIBE 10 MG PO TABS: ORAL_TABLET | ORAL | 0 refills | Status: DC

## 2021-11-11 NOTE — Telephone Encounter (Signed)
Spoke with the patient. Advised the patient that the cardiac finding in his non-cardiac CT are not new. Patient sts that he is doing fine from a cardiac standpoint. Pt sts that he has lost 50lbs unintentionally over the last 6 months. He is currently being worked up to find the cause.  Adv the patient to keep his scheduled March appt with Ignacia Bayley, NP. Gerald Stabs can discuss further at the appt. Patient verbalized understanding and voiced appreciation for the call.

## 2021-11-11 NOTE — Telephone Encounter (Signed)
Called patient for post procedure check. States no issues.

## 2021-11-11 NOTE — Telephone Encounter (Signed)
Patient recently had a CT from Dr Tasia Catchings  Wants to discuss with nurse and make sure we are updated on situation Please call to discuss

## 2021-11-11 NOTE — Telephone Encounter (Signed)
Requested Prescriptions   Signed Prescriptions Disp Refills   ezetimibe (ZETIA) 10 MG tablet 90 tablet 0    Sig: TAKE 1 TABLET(10 MG) BY MOUTH DAILY    Authorizing Provider: [name] A    Ordering User: Raelene Bott, Artrice Kraker L

## 2021-11-11 NOTE — Telephone Encounter (Signed)
°*  STAT* If patient is at the pharmacy, call can be transferred to refill team.   1. Which medications need to be refilled? (please list name of each medication and dose if known) ezetimibe (ZETIA) 10 MG 1 tablet daily  2. Which pharmacy/location (including street and city if local pharmacy) is medication to be sent to? [facility] on [address]  3. Do they need a 30 day or 90 day supply? 90 day

## 2021-11-13 ENCOUNTER — Telehealth: Payer: Self-pay | Admitting: Internal Medicine

## 2021-11-13 ENCOUNTER — Ambulatory Visit: Payer: Medicare Other | Admitting: Podiatry

## 2021-11-13 ENCOUNTER — Other Ambulatory Visit: Payer: Self-pay

## 2021-11-13 ENCOUNTER — Encounter: Payer: Self-pay | Admitting: Podiatry

## 2021-11-13 DIAGNOSIS — R234 Changes in skin texture: Secondary | ICD-10-CM

## 2021-11-13 DIAGNOSIS — E1142 Type 2 diabetes mellitus with diabetic polyneuropathy: Secondary | ICD-10-CM | POA: Diagnosis not present

## 2021-11-13 DIAGNOSIS — M79674 Pain in right toe(s): Secondary | ICD-10-CM | POA: Diagnosis not present

## 2021-11-13 DIAGNOSIS — B351 Tinea unguium: Secondary | ICD-10-CM | POA: Diagnosis not present

## 2021-11-13 DIAGNOSIS — M79675 Pain in left toe(s): Secondary | ICD-10-CM

## 2021-11-13 MED ORDER — AMMONIUM LACTATE 12 % EX LOTN: 1.0000 "application " | TOPICAL_LOTION | CUTANEOUS | 3 refills | Status: DC | PRN
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
## 2021-11-13 NOTE — Progress Notes (Signed)
This patient presents  to my office for at risk foot care.  This patient requires this care by a professional since this patient will be at risk due to having diabetic neuropathy.  This patient is unable to cut nails himself since the patient cannot reach his nails.These nails are painful walking and wearing shoes.  This patient presents for at risk foot care today.  General Appearance  Alert, conversant and in no acute stress.  Vascular  Dorsalis pedis and posterior tibial  pulses are palpable  bilaterally.  Capillary return is within normal limits  bilaterally. Temperature is within normal limits  bilaterally.  Neurologic  Senn-Weinstein monofilament wire test absent   bilaterally. Muscle power within normal limits bilaterally.  Nails Thick disfigured discolored nails with subungual debris  Hallux nails bilaterally. No evidence of bacterial infection or drainage bilaterally.  Orthopedic  No limitations of motion  feet .  No crepitus or effusions noted.  No bony pathology or digital deformities noted.  Mild  MTA.  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  Skin   No porokeratosis noted bilaterally.  No signs of infections or ulcers noted.  Dry peeling skin plantarly both feet.   Onychomycosis Hallux    Pain in right toes  Pain in left toes  Diabetic neuropathy.  Consent was obtained for treatment procedures.   Mechanical debridement of nails 1-5  bilaterally performed with a nail nipper.  Filed with dremel without incident. Prescribe amlatin.     Return office visit   10 weeks                 Told patient to return for periodic foot care and evaluation due to potential at risk complications.   Ethelyne Erich DPM  

## 2021-11-14 ENCOUNTER — Other Ambulatory Visit: Payer: Self-pay | Admitting: Cardiovascular Disease

## 2021-11-20 ENCOUNTER — Telehealth: Payer: Self-pay | Admitting: Anesthesiology

## 2021-11-20 NOTE — Telephone Encounter (Signed)
Patient called in and is asking why his oxycodone 10 mg has been decreased from 4 times per day to 3 times per day.  The last time it was qid was 08/07/21.  1206/223, 10/09/21 and 11/08/21 were decreased to q8 hours.  I told him I was not sure why the change was made but that I would be glad to check with Dr [name].  Patient is aware that he will not be in until Monday and he has enough to last until we can determine what is happening with his qty.

## 2021-11-20 NOTE — Telephone Encounter (Signed)
Has some questions about different medication dosage. Please call patient

## 2021-11-24 MED ORDER — OXYCODONE HCL 10 MG PO TABS: 10.0000 mg | ORAL_TABLET | Freq: Four times a day (QID) | ORAL | 0 refills | Status: DC

## 2021-11-24 MED ORDER — OXYCODONE HCL 10 MG PO TABS: 10.0000 mg | ORAL_TABLET | Freq: Four times a day (QID) | ORAL | 0 refills | Status: AC

## 2021-11-24 NOTE — Addendum Note (Signed)
Addended by: Molli Barrows on: 11/24/2021 08:16 AM   Modules accepted: Orders

## 2021-11-25 ENCOUNTER — Telehealth: Payer: Self-pay | Admitting: *Deleted

## 2021-11-25 NOTE — Telephone Encounter (Signed)
Wife called back and said that patient has appointment in 10 days with doctor, so "don't worry about that call I made earlier

## 2021-11-25 NOTE — Telephone Encounter (Signed)
Wife called reporting that patient has lost 15 pounds in the past 2 weeks, She states that he is eating and drinking as normal. She reports he has diarrhea now, he was constipated; and that he has alterations in taste. When questioned further, he recently had some teeth extracted and was put on antibiotics for a sinus infection (diarrhea started after antibiotics). Please advise.  CT report from 1/27: IMPRESSION: 1. Diffuse bilateral bronchial wall thickening with scattered areas of ground-glass and heterogeneous airspace opacity throughout the bilateral lungs, nonspecific and infectious or inflammatory. 2. No specific findings to explain reported weight loss. 3. Generally large burden of stool and stool balls throughout the colon and rectum. Correlate for constipation. 4. Coronary artery disease.   Aortic Atherosclerosis (ICD10-I70.0).     Electronically Signed   By: Delanna Ahmadi M.D.   On: 11/01/2021 16:50

## 2021-11-25 NOTE — Telephone Encounter (Signed)
Per Dr. Tasia Catchings, his recent CT results do not show any findings to explain his recent weight loss. Patient is currently taking Ozempic, and could be a result of medication. She recommends patient to reach out to his GI provider to discuss his recent bowel concerns.

## 2021-11-26 ENCOUNTER — Telehealth: Payer: Self-pay

## 2021-11-26 NOTE — Telephone Encounter (Signed)
Left a VM stating he spoke to [facility] last week and they agreed for him to go down to 3 pills a day, but he cannot manage that, he is hurting to much, and wants to go back up to 4 pills a day. Please call.

## 2021-11-26 NOTE — Telephone Encounter (Signed)
Called to let patient know that DR [name] has sent in oxycodone 10 mg qid qty 120 x 2 months.  Soonest fill date is 0225/23.  Patient  verbalizes u/o information.

## 2021-12-01 NOTE — Progress Notes (Addendum)
Name: [name]   MRN: [id number]    DOB: 1961/04/29   Date:12/02/2021       Progress Note  Subjective  Chief Complaint  Follow Up  HPI  Asthma : he is having sob and is seeing Dr. Raul Del, using Symbicort now, no longer on Trelegy  because of cost . Denies significant cough or wheezing   DMII:  he is under the care of Dr. Honor Junes , A1C was 8.4 % was down to 7.9 % went up to  8.3 %  and last one done 10/2021 it was down to 7.6 %  He states he has been checking glucose and it has been well controlled lately, it spikes after steroid injections.  He has associated HTN, dyslipidemia, neuropathy.  He is on statin therapy, ARB and also gabapentin. Taking insulin,  Metformin , SGL2 agonist and also GLP-1 agonist  . He needs a referral for eye exam   Hypothyroidism: he is currently on 50 mcg of levothyroxine but two on Sundays , he has  constipation but likely secondary to oxydocone , sometimes has soiling ,chronic dry skin this time of the year. He has stable dysphagia - going on for 30 years, and had EGD recently Normal TSH Dec 22   Low back pain/ inflammatory spondylopathy/Cervical radiculitis /Chronic pain syndrome/lyme arthritis : under the care of Dr. [name], he gets epidural injections and takes oxycodone.  Pain level is 8/10 on back and right knee   Malnutrition: he has lost 45 lbs since Dec 2021, and 15 lbs in the past 3.5 months. However due to his  anemia / iron deficiency of unknown cause , we referred him to hematologist/oncologist. Evaluation was negative. She discussed considering stopping GLP-1 agonist and also to see GI because of recent change in bowel movements . Discussed talking to Endo to see if he can hold GLP-1 agonist to see if he can gain weight.   Iron deficiency anemia plus weight loss: seeing GI and has gastritis and duodenitis but taking Dexilant, he had a capsule endoscopy but it has to be repeated since the capsule stayed in the stomach.   Myasthenia Gravis:  seeing Dr. Westley Foots, he is off Mestinon, currently not on medications but over the past few months he has noticed increase in generalized weakness, needs to take naps twice daily. He still has left eye ptosis, he is also noticing more balance problems and recurrent falls. He has not seen her since 10/2019 and explained he needs to go back for follow up. He will contact [facility] directly for a follow up  Atherosclerosis of aorta/ Angina and CHF: he states sleeps with the head of the bed elevated because of his wife, not sure if he has orthopnea, he has SOB with activity that is likely multifactorial,  denies lower extremity. He states chest pain not as frequent, he denies .edema BP has been low , taking metoprolol but now also on SGL-2 agonist.  We contacted Dr. Rockey Situ by instant message to see if we can decrease dose of Metoprolol from 100 mg daily to 50 mg daily since he is now having more orthostatic changes and a recent fall ( yesterday )  Right knee: he stood up from a chair , took 4 steps , felt dizzy , grabbed to [employer] and woke on the floor . He woke up immediately , both are sore but the right one is worse, some effusion, no redness or increase in warmth.   MDD:  he has  a history of recurrent depression, and worse over the past 18 months, father is sick up [place], wife has chronic medical problems, his health is not good also had to get his teeth pulled do to grinding his teeth . He will get dentures soon   OSA: he wears CPAP for  6 nights per week for about 8 hours.   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[place]) 12/02/2021   Gastropathy 08/13/2021   Unintentional weight loss 08/13/2021   Iron deficiency anemia due to chronic blood loss    Pain due to onychomycosis of toenails of both feet 11/11/2020   Lung involvement associated with another disorder ([place]) 05/17/2020   Left leg weakness 12/01/2019   Lower urinary tract symptoms 09/15/2019    Incomplete bladder emptying 09/15/2019   Carpal tunnel syndrome on both sides 07/12/2019   Weakness generalized 06/21/2019   Idiopathic peripheral neuropathy 04/21/2019   Sensory ataxia 03/09/2019   Colitis 07/08/2018   OSA on CPAP 07/08/2018   Congestive heart failure (CHF) ([place]) 05/05/2018   Unstable angina ([place]) 04/26/2018   Ischemic cardiomyopathy 04/26/2018   Chronic combined systolic and diastolic CHF (congestive heart failure) ([place]) 04/26/2018   Effort angina ([place]) 04/25/2018   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[place]) 01/25/2018   Vitamin D deficiency, unspecified 08/12/2017   Ptosis of left eyelid 01/08/2017   Dermatitis 12/24/2016   Difficulty walking 04/27/2016   Foot cramps 04/27/2016   Morbid (severe) obesity due to excess calories ([place]) 04/06/2016   Benign neoplasm of sigmoid colon    Benign neoplasm of descending colon    Benign neoplasm of transverse colon    Coronary artery disease involving native coronary artery with angina pectoris ([place]) 10/22/2015   Coronary artery disease 08/22/2015   Myasthenia gravis ([place]) 08/22/2015   Chronic left-sided low back pain 08/22/2015   Hypertension 08/22/2015   GERD (gastroesophageal reflux disease) 08/22/2015   Hyperlipidemia 08/22/2015   Insomnia 08/22/2015   Type 2 diabetes mellitus with diabetic polyneuropathy, with long-term current use of insulin ([place]) 08/22/2015   Asthma 08/22/2015   Lyme disease 06/05/1992    Past Surgical History:  Procedure Laterality Date   BILATERAL CARPAL TUNNEL RELEASE Bilateral L in 2012 and R in 2013   [place], most recent in  March 2016.   COLONOSCOPY WITH PROPOFOL N/A 01/10/2016   Procedure: COLONOSCOPY WITH PROPOFOL;  Surgeon: [name], MD;  Location: [facility];  Service: Endoscopy;  Laterality: N/A;   COLONOSCOPY WITH PROPOFOL N/A 04/14/2021   Procedure: COLONOSCOPY WITH PROPOFOL;  Surgeon: [name], MD;  Location: [facility];  Service: Endoscopy;  Laterality: N/A;  Diabetic - insulin and oral meds   CORONARY ANGIOPLASTY     CORONARY STENT INTERVENTION N/A 04/25/2018   Procedure: CORONARY STENT INTERVENTION;  Surgeon: [name], MD;  Location: [place] [facility];  Service: Cardiovascular;  Laterality: N/A;   ESOPHAGOGASTRODUODENOSCOPY (EGD) WITH PROPOFOL N/A 01/10/2016   Procedure: ESOPHAGOGASTRODUODENOSCOPY (EGD) WITH PROPOFOL;  Surgeon: [name], MD;  Location: [facility];  Service: Endoscopy;  Laterality: N/A;   ESOPHAGOGASTRODUODENOSCOPY (EGD) WITH PROPOFOL N/A 04/14/2021   Procedure: ESOPHAGOGASTRODUODENOSCOPY (EGD) WITH BIOPSY;  Surgeon: [name], MD;  Location: [place];  Service: Endoscopy;  Laterality: N/A;   EYE SURGERY Bilateral 2012   cataract/bilateral vitrectomies   GIVENS CAPSULE STUDY N/A 08/20/2021   Procedure: GIVENS CAPSULE STUDY;  Surgeon: [name], MD;  Location: [facility];  Service: Endoscopy;  Laterality: N/A;   LEFT HEART CATH AND CORONARY ANGIOGRAPHY Left 04/25/2018   Procedure: LEFT HEART CATH AND CORONARY ANGIOGRAPHY;  Surgeon: [name], MD;  Location: [place] [facility];  Service: Cardiovascular;  Laterality: Left;   TEE WITHOUT CARDIOVERSION N/A 09/05/2018   Procedure: TRANSESOPHAGEAL ECHOCARDIOGRAM (TEE);  Surgeon: [name], MD;  Location: [facility];  Service: Cardiovascular;  Laterality: N/A;   TONSILLECTOMY AND ADENOIDECTOMY     As a child   TUNNELED VENOUS CATHETER PLACEMENT     removed    Family History  Problem Relation Age of Onset   Diabetes Mother    Heart disease Mother    Cancer Father        Prostate CA, Anal cancer    Dementia Father    Diabetes Brother    Healthy Brother    Healthy Brother     Social History   Tobacco Use   Smoking status: Never   Smokeless tobacco: Never   Tobacco comments:    smoking cessation materials not required  Substance Use Topics   Alcohol use: Not Currently     Alcohol/week: 0.0 standard drinks     Current Outpatient Medications:    albuterol (PROVENTIL) (2.5 MG/3ML) 0.083% nebulizer solution, Take 3 mLs (2.5 mg total) by nebulization every 6 (six) hours as needed for wheezing or shortness of breath., Disp: 75 mL, Rfl: 2   ammonium lactate (AMLACTIN) 12 % lotion, Apply 1 application topically as needed for dry skin., Disp: 400 g, Rfl: 3   aspirin EC 81 MG tablet, Take 1 tablet (81 mg total) by mouth daily., Disp: 90 tablet, Rfl: 3   budesonide-formoterol (SYMBICORT) 160-4.5 MCG/ACT inhaler, Inhale 2 puffs into the lungs 2 (two) times daily., Disp: 1 each, Rfl: 3   clopidogrel (PLAVIX) 75 MG tablet, TAKE 1 TABLET(75 MG) BY MOUTH DAILY WITH BREAKFAST, Disp: 30 tablet, Rfl: 6   cyclobenzaprine (FLEXERIL) 10 MG tablet, Take 1 tablet (10 mg total) by mouth 2 (two) times daily., Disp: 60 tablet, Rfl: 5   dexlansoprazole (DEXILANT) 60 MG capsule, Take 1 capsule (60 mg total) by mouth daily., Disp: 30 capsule, Rfl: 11   ezetimibe (ZETIA) 10 MG tablet, TAKE 1 TABLET(10 MG) BY MOUTH DAILY, Disp: 90 tablet, Rfl: 0   furosemide (LASIX) 20 MG tablet, TAKE 1 TABLET BY MOUTH DAILY AS NEEDED FOR WEIGHT GAIN OF 2 POUNDS OVERNIGHT OR 5 POUNDS PER WEEK, Disp: 90 tablet, Rfl: 0   gabapentin (NEURONTIN) 300 MG capsule, Take 1 capsule (300 mg total) by mouth 3 (three) times daily., Disp: 90 capsule, Rfl: 1   gabapentin (NEURONTIN) 800 MG tablet, Take 800 mg by mouth at bedtime., Disp: , Rfl:    insulin lispro (HUMALOG) 100 UNIT/ML KiwkPen, Inject 7-8 Units into the skin 3 (three) times daily. Reported on 12/02/2015/ sliding scale 1 unit for every 7 units of carbs; and 1 unit for every 20 above 120, Disp: , Rfl:    Insulin Pen Needle 32G X 4 MM MISC, Inject 1 each into the skin as needed., Disp: , Rfl:    JARDIANCE 10 MG TABS tablet, TAKE 1 TABLET(10 MG) BY MOUTH DAILY BEFORE BREAKFAST, Disp: 30 tablet, Rfl: 4   LANTUS SOLOSTAR 100 UNIT/ML Solostar Pen, Inject 23-26 Units into  the skin daily at 10 pm., Disp: , Rfl: 0   levocetirizine (XYZAL) 5 MG tablet, TAKE 1 TABLET(5 MG) BY MOUTH EVERY EVENING, Disp: 30 tablet, Rfl: 3  loratadine (CLARITIN) 10 MG tablet, Take 10 mg by mouth daily. , Disp: , Rfl:    lubiprostone (AMITIZA) 24 MCG capsule, Take 1 capsule (24 mcg total) by mouth 2 (two) times daily with a meal., Disp: 180 capsule, Rfl: 1   metFORMIN (GLUCOPHAGE) 1000 MG tablet, Take 1,000 mg by mouth 2 (two) times daily with a meal., Disp: , Rfl:    metoprolol succinate (TOPROL-XL) 100 MG 24 hr tablet, TAKE 1 TABLET(100 MG) BY MOUTH DAILY WITH OR IMMEDIATELY FOLLOWING A MEAL, Disp: 90 tablet, Rfl: 1   montelukast (SINGULAIR) 10 MG tablet, Take 1 tablet by mouth at bedtime., Disp: , Rfl:    Multiple Vitamins-Minerals (MENS MULTI VITAMIN & MINERAL PO), Take 1 tablet by mouth in the morning and at bedtime., Disp: , Rfl:    naloxone (NARCAN) nasal spray 4 mg/0.1 mL, For excess sedation from opioids, Disp: 1 kit, Rfl: 2   nitroGLYCERIN (NITROSTAT) 0.3 MG SL tablet, Place 1 tablet (0.3 mg total) under the tongue every 5 (five) minutes as needed for chest pain. Maximum 3 doses., Disp: 25 tablet, Rfl: 3   Oxycodone HCl 10 MG TABS, Take 1 tablet (10 mg total) by mouth in the morning, at noon, in the evening, and at bedtime., Disp: 120 tablet, Rfl: 0   [START ON 12/29/2021] Oxycodone HCl 10 MG TABS, Take 1 tablet (10 mg total) by mouth 4 (four) times daily., Disp: 120 tablet, Rfl: 0   OZEMPIC, 1 MG/DOSE, 4 MG/3ML SOPN, Inject 1 mg into the skin once a week., Disp: , Rfl:    rosuvastatin (CRESTOR) 5 MG tablet, Take 5 mg by mouth daily., Disp: , Rfl: 0   tamsulosin (FLOMAX) 0.4 MG CAPS capsule, TAKE 2 CAPSULES BY MOUTH DAILY, Disp: 60 capsule, Rfl: 2   DULoxetine (CYMBALTA) 30 MG capsule, Take 1 capsule (30 mg total) by mouth daily., Disp: 90 capsule, Rfl: 1   DULoxetine (CYMBALTA) 60 MG capsule, Take 1 capsule (60 mg total) by mouth daily., Disp: 90 capsule, Rfl: 1   levothyroxine  (SYNTHROID) 50 MCG tablet, TAKE 1 TABLET BY MOUTH DAILY MONDAY THROUGH SATURDAY AND 2 TABLETS EVERY SUNDAY, TAKE BEFORE BREAKFAST(RECHECK LEVELS IN 6 WEEKS), Disp: 102 tablet, Rfl: 0  Allergies  Allergen Reactions   Azathioprine Other (See Comments)    Azathioprine hypersensitivity reaction - Symptoms mimicking sepsis - was hospitalized   Novolog [Insulin Aspart] Hives    I personally reviewed active problem list, medication list, allergies, family history, social history, health maintenance with the patient/caregiver today.   ROS  Constitutional: Negative for fever, positive for  weight change.  Respiratory: Negative for cough, positive for  shortness of breath.   Cardiovascular: Negative for chest pain or palpitations.  Gastrointestinal: Negative for abdominal pain, no bowel changes.  Musculoskeletal: Positive for gait problem and right knee  joint swelling.  Skin: Negative for rash.  Neurological: positive  for dizziness but no headache.  No other specific complaints in a complete review of systems (except as listed in HPI above).   Objective  Vitals:   12/02/21 1407  BP: 118/68  Pulse: 99  Resp: 16  Temp: 97.8 F (36.6 C)  TempSrc: Oral  SpO2: 93%  Weight: 202 lb (91.6 kg)  Height: 5' 7" (1.702 m)    Body mass index is 31.64 kg/m.  Physical Exam  Constitutional: Patient appears well-developed and well-nourished. Obese No distress.  HEENT: head atraumatic, normocephalic, pupils equal and reactive to ligh, neck supple Cardiovascular: Normal rate, regular rhythm  and normal heart sounds.  No murmur heard. No BLE edema. Pulmonary/Chest: Effort normal , scattered wheezing No respiratory distress. Abdominal: Soft.  There is no tenderness. Psychiatric: Patient has a normal mood and affect. behavior is normal. Judgment and thought content normal.  Muscular skeletal: he was wearing a brace, mild erythema on right knee and effusion is mild, pain during palpation medially.  Using a cane, slow gait   Recent Results (from the past 2160 hour(s))  TSH     Status: None   Collection Time: 09/23/21 10:29 AM  Result Value Ref Range   TSH 2.18 0.40 - 4.50 mIU/L  PSA     Status: None   Collection Time: 09/23/21 10:29 AM  Result Value Ref Range   PSA 0.56 < OR = 4.00 ng/mL    Comment: The total PSA value from this assay system is  standardized against the WHO standard. The test  result will be approximately 20% lower when compared  to the equimolar-standardized total PSA (Beckman  Coulter). Comparison of serial PSA results should be  interpreted with this fact in mind. . This test was performed using the Siemens  chemiluminescent method. Values obtained from  different assay methods cannot be used interchangeably. PSA levels, regardless of value, should not be interpreted as absolute evidence of the presence or absence of disease.   CBC w/Diff/Platelet     Status: Abnormal   Collection Time: 09/23/21 10:29 AM  Result Value Ref Range   WBC 8.6 3.8 - 10.8 Thousand/uL   RBC 4.55 4.20 - 5.80 Million/uL   Hemoglobin 12.7 (L) 13.2 - 17.1 g/dL   HCT 39.7 38.5 - 50.0 %   MCV 87.3 80.0 - 100.0 fL   MCH 27.9 27.0 - 33.0 pg   MCHC 32.0 32.0 - 36.0 g/dL   RDW 15.4 (H) 11.0 - 15.0 %   Platelets 185 140 - 400 Thousand/uL   MPV 10.4 7.5 - 12.5 fL   Neutro Abs 6,854 1,500 - 7,800 cells/uL   Lymphs Abs 1,015 850 - 3,900 cells/uL   Absolute Monocytes 550 200 - 950 cells/uL   Eosinophils Absolute 163 15 - 500 cells/uL   Basophils Absolute 17 0 - 200 cells/uL   Neutrophils Relative % 79.7 %   Total Lymphocyte 11.8 %   Monocytes Relative 6.4 %   Eosinophils Relative 1.9 %   Basophils Relative 0.2 %  COMPLETE METABOLIC PANEL WITH GFR     Status: Abnormal   Collection Time: 09/23/21 10:29 AM  Result Value Ref Range   Glucose, Bld 174 (H) 65 - 99 mg/dL    Comment: .            Fasting reference interval . For someone without known diabetes, a glucose value >125  mg/dL indicates that they may have diabetes and this should be confirmed with a follow-up test. .    BUN 25 7 - 25 mg/dL   Creat 1.16 0.70 - 1.35 mg/dL   eGFR 71 > OR = 60 mL/min/1.54m    Comment: The eGFR is based on the CKD-EPI 2021 equation. To calculate  the new eGFR from a previous Creatinine or Cystatin C result, go to https://www.kidney.org/professionals/ kdoqi/gfr%5Fcalculator    BUN/Creatinine Ratio NOT APPLICABLE 6 - 22 (calc)   Sodium 139 135 - 146 mmol/L   Potassium 4.4 3.5 - 5.3 mmol/L   Chloride 103 98 - 110 mmol/L   CO2 28 20 - 32 mmol/L   Calcium 9.1 8.6 - 10.3 mg/dL  Total Protein 6.1 6.1 - 8.1 g/dL   Albumin 4.0 3.6 - 5.1 g/dL   Globulin 2.1 1.9 - 3.7 g/dL (calc)   AG Ratio 1.9 1.0 - 2.5 (calc)   Total Bilirubin 0.4 0.2 - 1.2 mg/dL   Alkaline phosphatase (APISO) 67 35 - 144 U/L   AST 18 10 - 35 U/L   ALT 21 9 - 46 U/L  Novel Coronavirus, NAA ([facility])     Status: Abnormal   Collection Time: 10/10/21  3:45 AM   Specimen: Nasopharyngeal(NP) swabs in vial transport medium   Nasopharynge  Previous  Result Value Ref Range   SARS-CoV-2, NAA Detected (A) Not Detected    Comment: Patients who have a positive COVID-19 test result may now have treatment options. Treatment options are available for patients with mild to moderate symptoms and for hospitalized patients. Visit our website at [email or web address] for resources and information. This nucleic acid amplification test was developed and its performance characteristics determined by [employer]. Nucleic acid amplification tests include RT-PCR and TMA. This test has not been FDA cleared or approved. This test has been authorized by FDA under an Emergency Use Authorization (EUA). This test is only authorized for the duration of time the declaration that circumstances exist justifying the authorization of the emergency use of in vitro diagnostic tests for detection of SARS-CoV-2 virus  and/or diagnosis of COVID-19 infection under section 564(b)(1) of the Act, 21 U.S.C. 161WRU-0(A) (1), unless the authorization is terminated or revoked sooner. When diagnostic testing is negativ e, the possibility of a false negative result should be considered in the context of a patient's recent exposures and the presence of clinical signs and symptoms consistent with COVID-19. An individual without symptoms of COVID-19 and who is not shedding SARS-CoV-2 virus would expect to have a negative (not detected) result in this assay.   SARS-COV-2, NAA 2 DAY TAT     Status: None   Collection Time: 10/10/21  3:45 AM   Nasopharynge  Previous  Result Value Ref Range   SARS-CoV-2, NAA 2 DAY TAT Performed   POCT Influenza A/B     Status: Normal   Collection Time: 10/10/21  1:18 PM  Result Value Ref Range   Influenza A, POC Negative Negative   Influenza B, POC Negative Negative  POCT HgB A1C     Status: Abnormal   Collection Time: 10/21/21  8:47 AM  Result Value Ref Range   Hemoglobin A1C 7.6 (A) 4.0 - 5.6 %   HbA1c POC (<> result, manual entry)     HbA1c, POC (prediabetic range)     HbA1c, POC (controlled diabetic range)    I-STAT creatinine     Status: Abnormal   Collection Time: 10/31/21  9:53 AM  Result Value Ref Range   Creatinine, Ser 1.30 (H) 0.61 - 1.24 mg/dL     PHQ2/9: Depression screen [facility] 2/9 12/02/2021 11/10/2021 10/31/2021 10/27/2021 10/21/2021  Decreased Interest 0 0 0 0 0  Down, Depressed, Hopeless 0 0 0 0 0  PHQ - 2 Score 0 0 0 0 0  Altered sleeping 0 - - 0 0  Tired, decreased energy 0 - - 0 0  Change in appetite 0 - - 0 0  Feeling bad or failure about yourself  0 - - 0 0  Trouble concentrating 0 - - 0 0  Moving slowly or fidgety/restless 0 - - 0 0  Suicidal thoughts 0 - - 0 0  PHQ-9 Score 0 - - 0 0  Difficult doing work/chores Not difficult at all - - Not difficult at all Not difficult at all  Some recent data might be hidden    phq 9 is negative   Fall  Risk: Fall Risk  12/02/2021 11/10/2021 10/27/2021 10/21/2021 10/10/2021  Falls in the past year? _0 0  Comment - - - - -  Number falls in past yr: 1 0 1 1 0  Comment - - - - -  Injury with Fall? 0 0 0 0 0  Comment - - - - -  Risk Factor Category  - - - - -  Risk for fall due to : Impaired balance/gait - - Impaired balance/gait -  Risk for fall due to: Comment - - - - -  Follow up Falls prevention discussed;Education provided - - Falls prevention discussed -      Functional Status Survey: Is the patient deaf or have difficulty hearing?: Yes Does the patient have difficulty seeing, even when wearing glasses/contacts?: Yes Does the patient have difficulty concentrating, remembering, or making decisions?: No Does the patient have difficulty walking or climbing stairs?: Yes Does the patient have difficulty dressing or bathing?: No Does the patient have difficulty doing errands alone such as visiting a doctor's office or shopping?: No    Assessment & Plan  1. Type 2 diabetes mellitus with diabetic polyneuropathy, with long-term current use of insulin ([place])  - Ambulatory referral to Ophthalmology - Urine Microalbumin w/creat. ratio  2. Atherosclerosis of aorta ([place])  On statin therapy  3. Chronic combined systolic and diastolic CHF (congestive heart failure) (H[facility])  On SGL2 agonist   4. Myasthenia gravis ([place])  Needs to [facility] up [facility]   5. Unstable angina ([facility])  Taking NTG once or twice weekly   6. Mild protein-calorie malnutrition ([place])  Discussed importance of trying to hold GLP-1 agonist to see if she can gain weight  7. 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 Hypothyroidism, acquired, autoimmune  - levothyroxine (SYNTHROID) 50 MCG tablet; TAKE 1 TABLET BY MOUTH DAILY MONDAY THROUGH SATURDAY AND 2 TABLETS EVERY SUNDAY, TAKE BEFORE BREAKFAST(RECHECK LEVELS IN 6 WEEKS)  Dispense: 102 tablet; Refill: 0  9. Need for shingles  vaccine  refused  10. Moderate persistent asthma, unspecified whether complicated   11. Vitamin D deficiency  Continue supplements   12. B12 deficiency  Continue supplementation   13. Chronic pain syndrome  Keep follow up with pain clinic   14. GAD (generalized anxiety disorder)  - DULoxetine (CYMBALTA) 60 MG capsule; Take 1 capsule (60 mg total) by mouth daily.  Dispense: 90 capsule; Refill: 1 - DULoxetine (CYMBALTA) 30 MG capsule; Take 1 capsule (30 mg total) by mouth daily.  Dispense: 90 capsule; Refill: 1  15. Coronary artery disease of native artery of native heart with stable angina pectoris ([place])   16. Moderate episode of recurrent major depressive disorder (HCC)  - DULoxetine (CYMBALTA) 60 MG capsule; Take 1 capsule (60 mg total) by mouth daily.  Dispense: 90 capsule; Refill: 1 - DULoxetine (CYMBALTA) 30 MG capsule; Take 1 capsule (30 mg total) by mouth daily.  Dispense: 90 capsule; Refill: 1  17. Therapeutic opioid induced constipation  - lubiprostone (AMITIZA) 24 MCG capsule; Take 1 capsule (24 mcg total) by mouth 2 (two) times daily with a meal.  Dispense: 180 capsule; Refill: 1  18. Acute pain of right knee   With medial effusion, some redness but had a brace on, advised  RICE and if no improvement will need X-ray and possible MRI

## 2021-12-02 ENCOUNTER — Other Ambulatory Visit: Payer: Self-pay | Admitting: Family

## 2021-12-02 ENCOUNTER — Encounter: Payer: Self-pay | Admitting: Family Medicine

## 2021-12-02 ENCOUNTER — Ambulatory Visit (INDEPENDENT_AMBULATORY_CARE_PROVIDER_SITE_OTHER): Payer: Medicare Other | Admitting: Family Medicine

## 2021-12-02 ENCOUNTER — Other Ambulatory Visit: Payer: Self-pay

## 2021-12-02 VITALS — BP 118/68 | HR 99 | Temp 97.8°F | Resp 16 | Ht 67.0 in | Wt 202.0 lb

## 2021-12-02 DIAGNOSIS — E1142 Type 2 diabetes mellitus with diabetic polyneuropathy: Secondary | ICD-10-CM | POA: Diagnosis not present

## 2021-12-02 DIAGNOSIS — M25561 Pain in right knee: Secondary | ICD-10-CM

## 2021-12-02 DIAGNOSIS — I5042 Chronic combined systolic (congestive) and diastolic (congestive) heart failure: Secondary | ICD-10-CM

## 2021-12-02 DIAGNOSIS — G7000 Myasthenia gravis without (acute) exacerbation: Secondary | ICD-10-CM

## 2021-12-02 DIAGNOSIS — T402X5A Adverse effect of other opioids, initial encounter: Secondary | ICD-10-CM

## 2021-12-02 DIAGNOSIS — I25118 Atherosclerotic heart disease of native coronary artery with other forms of angina pectoris: Secondary | ICD-10-CM

## 2021-12-02 DIAGNOSIS — Z794 Long term (current) use of insulin: Secondary | ICD-10-CM

## 2021-12-02 DIAGNOSIS — E559 Vitamin D deficiency, unspecified: Secondary | ICD-10-CM

## 2021-12-02 DIAGNOSIS — K5903 Drug induced constipation: Secondary | ICD-10-CM

## 2021-12-02 DIAGNOSIS — Z23 Encounter for immunization: Secondary | ICD-10-CM

## 2021-12-02 DIAGNOSIS — G894 Chronic pain syndrome: Secondary | ICD-10-CM

## 2021-12-02 DIAGNOSIS — I700 Atherosclerosis of aorta: Secondary | ICD-10-CM | POA: Diagnosis not present

## 2021-12-02 DIAGNOSIS — E441 Mild protein-calorie malnutrition: Secondary | ICD-10-CM

## 2021-12-02 DIAGNOSIS — A6923 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: Secondary | ICD-10-CM

## 2021-12-02 DIAGNOSIS — F331 Major depressive disorder, recurrent, moderate: Secondary | ICD-10-CM

## 2021-12-02 DIAGNOSIS — I255 Ischemic cardiomyopathy: Secondary | ICD-10-CM

## 2021-12-02 DIAGNOSIS — I200 Unstable angina: Secondary | ICD-10-CM

## 2021-12-02 DIAGNOSIS — J4540 Moderate persistent asthma, uncomplicated: Secondary | ICD-10-CM

## 2021-12-02 DIAGNOSIS — E063 Autoimmune thyroiditis: Secondary | ICD-10-CM

## 2021-12-02 DIAGNOSIS — F411 Generalized anxiety disorder: Secondary | ICD-10-CM

## 2021-12-02 DIAGNOSIS — E538 Deficiency of other specified B group vitamins: Secondary | ICD-10-CM

## 2021-12-02 MED ORDER — LUBIPROSTONE 24 MCG PO CAPS: 24.0000 ug | ORAL_CAPSULE | Freq: Two times a day (BID) | ORAL | 1 refills | Status: DC

## 2021-12-02 MED ORDER — DULOXETINE HCL 30 MG PO CPEP: 30.0000 mg | ORAL_CAPSULE | Freq: Every day | ORAL | 1 refills | Status: DC

## 2021-12-02 MED ORDER — LEVOTHYROXINE SODIUM 50 MCG PO TABS: ORAL_TABLET | ORAL | 0 refills | Status: DC

## 2021-12-02 MED ORDER — DULOXETINE HCL 60 MG PO CPEP: 60.0000 mg | ORAL_CAPSULE | Freq: Every day | ORAL | 1 refills | Status: DC

## 2021-12-02 NOTE — Telephone Encounter (Signed)
Rx(s) sent to pharmacy electronically.  

## 2021-12-04 ENCOUNTER — Other Ambulatory Visit: Payer: Self-pay | Admitting: Family Medicine

## 2021-12-04 ENCOUNTER — Telehealth: Payer: Self-pay

## 2021-12-04 DIAGNOSIS — I25118 Atherosclerotic heart disease of native coronary artery with other forms of angina pectoris: Secondary | ICD-10-CM

## 2021-12-04 DIAGNOSIS — I255 Ischemic cardiomyopathy: Secondary | ICD-10-CM

## 2021-12-04 MED ORDER — METOPROLOL SUCCINATE ER 50 MG PO TB24: 50.0000 mg | ORAL_TABLET | Freq: Every day | ORAL | 0 refills | Status: DC

## 2021-12-04 NOTE — Telephone Encounter (Signed)
Left voicemail relaying medication change per Dr. Ancil Boozer. (Metoprolol to 50mg ), Patient advised to call back with any questions or concerns and to update [place] if his dizziness does not subside or if he has any new or worsening symptoms.  ?

## 2021-12-10 ENCOUNTER — Ambulatory Visit: Payer: Medicare Other | Admitting: Nurse Practitioner

## 2021-12-10 ENCOUNTER — Encounter: Payer: Self-pay | Admitting: Nurse Practitioner

## 2021-12-10 ENCOUNTER — Other Ambulatory Visit: Payer: Self-pay

## 2021-12-10 VITALS — BP 95/67 | HR 97 | Ht 67.0 in | Wt 201.4 lb

## 2021-12-10 DIAGNOSIS — I5020 Unspecified systolic (congestive) heart failure: Secondary | ICD-10-CM | POA: Diagnosis not present

## 2021-12-10 DIAGNOSIS — E785 Hyperlipidemia, unspecified: Secondary | ICD-10-CM

## 2021-12-10 DIAGNOSIS — I25118 Atherosclerotic heart disease of native coronary artery with other forms of angina pectoris: Secondary | ICD-10-CM | POA: Diagnosis not present

## 2021-12-10 DIAGNOSIS — D500 Iron deficiency anemia secondary to blood loss (chronic): Secondary | ICD-10-CM

## 2021-12-10 DIAGNOSIS — I951 Orthostatic hypotension: Secondary | ICD-10-CM | POA: Diagnosis not present

## 2021-12-10 DIAGNOSIS — G4733 Obstructive sleep apnea (adult) (pediatric): Secondary | ICD-10-CM

## 2021-12-10 DIAGNOSIS — I255 Ischemic cardiomyopathy: Secondary | ICD-10-CM

## 2021-12-10 NOTE — Patient Instructions (Signed)
Medication Instructions:  ?Changes as directed.  ? ?*If you need a refill on your cardiac medications before your next appointment, please call your pharmacy* ? ? ?Lab Work: ?BMET & CBC ? ?If you have labs (blood work) drawn today and your tests are completely normal, you will receive your results only by: ?MyChart Message (if you have MyChart) OR ?A paper copy in the mail ?If you have any lab test that is abnormal or we need to change your treatment, we will call you to review the results. ? ? ?Testing/Procedures: ?None ? ? ?Follow-Up: ?At [facility], you and your health needs are our priority.  As part of our continuing mission to provide you with exceptional heart care, we have created designated Provider Care Teams.  These Care Teams include your primary Cardiologist (physician) and Advanced Practice Providers (APPs -  Physician Assistants and Nurse Practitioners) who all work together to provide you with the care you need, when you need it. ? ? ?Your next appointment:   ?2 month(s) ? ?The format for your next appointment:   ?In Person ? ?Provider:   ?[name], MD or Murray Hodgkins, NP  ?

## 2021-12-10 NOTE — Progress Notes (Signed)
Office Visit    Patient Name: [name] Date of Encounter: 12/10/2021  Primary Care Provider:  [name], MD Primary Cardiologist:  [name], MD  Chief Complaint    61 year old male with a history of CAD status post multiple PCI's, ischemic cardiomyopathy, HFrEF, hypertension, hyperlipidemia, type 2 diabetes mellitus, diabetic neuropathy, myasthenia gravis, sepsis, and obstructive sleep apnea, who presents for follow-up of CAD and heart failure.  Past Medical History    Past Medical History:  Diagnosis Date   Allergy    dust, seasonal (worse in the fall).   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 Followed by Pain Specialist in [place], back and neck   Asthma    BRONCHITIS   Cataract    First Dx in 2012   Chronic combined systolic and diastolic congestive heart failure ([place])    a. 03/2018 Echo: EF 30-35%; b. 07/2018 Echo: EF 35-40%; c. 09/2019 TEE: EF 40-45%; d. 12/2020 Echo: EF 35-40%, sev apical ant, apical lat, apical inf, apical HK. GrI DD, nl RV fxn.   Coronary artery disease    a. Prior Ant MI->s/p multiple stents placed in the LAD and right coronary artery ([place]); b. 2016 Cath: reportedly nonobs dzs;  c. 04/2018 Cath/PCI: LM nl, LAD 20p, patent mid stent, LCX 62m(3.25x15 [place] DES), OM1 nl, OM2 50, OM3 40 w/ patent stent, RCA 40p, 69m, 40d w/ patent stent in RPDA, RPAV 60, EF 25-35%. 2+MR; d. 02/2019 MV: Apical scar, no isch, EF 30-44%.   Deaf, left    Diabetes mellitus without complication ([place])    TYPE 2   Diabetic peripheral neuropathy ([place])    feet and hands   FUO (fever of unknown origin) 08/03/2018   GERD (gastroesophageal reflux disease)    Headache    muscle tension   Hyperlipidemia    Hypertension    CONTROLLED ON MEDS   Hyperthyroidism    Insomnia    Ischemic cardiomyopathy    a. 03/2018 Echo: EF 30-35%, ant, antlat, apical AK, Gr1 DD; b. 07/2018 Echo: EF 35-40%, anteroseptal, apical, and ant HK. Gr1 DD; c. 09/2019 TEE: EF 40-45%; d.  12/2020 Echo: EF 35-40%, sev apical ant, apical lat, apical inf, apical HK. GrI DD, nl RV fxn.   Knee pain, acute 05/06/2020   Left arm weakness 10/04/2019   Left leg weakness 12/01/2019   Lyme disease    Chronic   Myasthenia gravis ([place])    (03/18/21 - no current treatment - "better than it has ever been" per pt)   Myocardial infarction ([place]) 2010   Palpitations    a. 10/2020 Zio: RSR, 88 avg. 3 brief SVT episodes (max 5 beats @ 128). Rare PACs/PVCs. Triggered events did not correlate w/ significant arrthymia - some w/ sinus tach.   Seasonal allergies    Sepsis ([place])    a.07/2018 - unknown source. TEE neg for veg 09/2019.   Sleep apnea    CPAP   Wears hearing aid in both ears    Past Surgical History:  Procedure Laterality Date   BILATERAL CARPAL TUNNEL RELEASE Bilateral L in 2012 and R in 2013   [place], most recent in  March 2016.   COLONOSCOPY WITH PROPOFOL N/A 01/10/2016   Procedure: COLONOSCOPY WITH PROPOFOL;  Surgeon: [name], MD;  Location: [facility] ENDOSCOPY;  Service: Endoscopy;  Laterality: N/A;   COLONOSCOPY WITH PROPOFOL N/A 04/14/2021   Procedure: COLONOSCOPY WITH PROPOFOL;  Surgeon:  [name], MD;  Location: [place];  Service: Endoscopy;  Laterality: N/A;  Diabetic - insulin and oral meds   CORONARY ANGIOPLASTY     CORONARY STENT INTERVENTION N/A 04/25/2018   Procedure: CORONARY STENT INTERVENTION;  Surgeon: [name], MD;  Location: [place] [facility];  Service: Cardiovascular;  Laterality: N/A;   ESOPHAGOGASTRODUODENOSCOPY (EGD) WITH PROPOFOL N/A 01/10/2016   Procedure: ESOPHAGOGASTRODUODENOSCOPY (EGD) WITH PROPOFOL;  Surgeon: [name], MD;  Location: [facility] ENDOSCOPY;  Service: Endoscopy;  Laterality: N/A;   ESOPHAGOGASTRODUODENOSCOPY (EGD) WITH PROPOFOL N/A 04/14/2021   Procedure: ESOPHAGOGASTRODUODENOSCOPY (EGD) WITH BIOPSY;  Surgeon: [name], MD;  Location: [place];  Service: Endoscopy;   Laterality: N/A;   EYE SURGERY Bilateral 2012   cataract/bilateral vitrectomies   GIVENS CAPSULE STUDY N/A 08/20/2021   Procedure: GIVENS CAPSULE STUDY;  Surgeon: [name], MD;  Location: [facility] ENDOSCOPY;  Service: Endoscopy;  Laterality: N/A;   LEFT HEART CATH AND CORONARY ANGIOGRAPHY Left 04/25/2018   Procedure: LEFT HEART CATH AND CORONARY ANGIOGRAPHY;  Surgeon: [name], MD;  Location: [place] [facility];  Service: Cardiovascular;  Laterality: Left;   TEE WITHOUT CARDIOVERSION N/A 09/05/2018   Procedure: TRANSESOPHAGEAL ECHOCARDIOGRAM (TEE);  Surgeon: [name], MD;  Location: [facility] ORS;  Service: Cardiovascular;  Laterality: N/A;   TONSILLECTOMY AND ADENOIDECTOMY     As a child   TUNNELED VENOUS CATHETER PLACEMENT     removed    Allergies  Allergies  Allergen Reactions   Azathioprine Other (See Comments)    Azathioprine hypersensitivity reaction - Symptoms mimicking sepsis - was hospitalized   Novolog [Insulin Aspart] Hives    History of Present Illness    60 year old male with the above complex past medical history including CAD status post multiple PCI's, ischemic cardiomyopathy, HFrEF, hypertension, hyperlipidemia, type 2 diabetes mellitus, diabetic neuropathy, myasthenia gravis, sepsis, and obstructive sleep apnea.  He initially suffered a myocardial infarction in 2009 while living in [place].  He has had approximately 10 stents between the LAD and right coronary artery over time.  In June 2019, he had worsening dyspnea on exertion and chest pain.  Echo showed an EF of 30 to 35%.  Cath showed a new 90% stenosis in the mid left circumflex with multiple patent LAD, RCA, and OM 2 stents.  The circumflex was successfully treated with a drug-eluting stent.  He was admitted twice in the fall 2019 with sepsis.  TEE in December 2019 showed an EF of 40 to 45% without vegetation.  In October 2021, had a syncopal episode in the setting of coughing.  He was subsequently  found to be orthostatic and was encouraged to increase p.o. fluids.  ZIO monitoring in January 2022, did not show any significant arrhythmias.  Triggered events were so she was sinus tachycardia.  Most recent echocardiogram in March 2022 continue to show LV dysfunction with an EF of 35 to 40%, severe apical anterior, apical lateral, apical inferior, and apical hypokinesis.  He also has grade 1 diastolic dysfunction, and normal RV function.  Guideline directed medical therapy has historically been limited by dizziness and orthostasis.  He did not tolerate Entresto previously.  Mr. Massenburg was last seen in cardiology clinic in October 2022 at which time he reported not needing any as needed Lasix in the prior 6 months.  His weight was down 36 pounds following initiation of Ozempic therapy and he was euvolemic on exam.  Over the past 2 months, he has been using Lasix just  about every other day.  He notes that typically, as a day progresses, he might experience edema above his sock line.  When this occurs, he will take Lasix the following day.  He does not experience edema first thing in the morning.  Over a similar period of time, he has been experiencing more frequent episodes of lightheadedness associated with standing and sudden position changes.  He has not had any syncope.  I note that he had a creatinine checked on January 27, and this was slightly elevated at 1.30.  He was not advised to change any part of his regimen at that time.  He has chronic, stable angina, and has had to use sublingual nitroglycerin about 2 times in the past 5 months, which is common for him.  He says overall, his anginal symptoms are stable.  He denies dyspnea on exertion.  He has continued to lose weight since his last visit, and is down another 15 pounds since then, and a total of 53 pounds since October 2021.  He largely attributes this to ongoing use of Ozempic and being more choosy with his diet.  In the setting of weight loss and  orthostatic symptoms, he has noted low blood pressures.  His metoprolol dose was recently reduced from 100 mg daily to 50 mg daily.  He denies palpitations, PND, orthopnea, or early satiety.  Home Medications    Current Outpatient Medications  Medication Sig Dispense Refill   albuterol (PROVENTIL) (2.5 MG/3ML) 0.083% nebulizer solution Take 3 mLs (2.5 mg total) by nebulization every 6 (six) hours as needed for wheezing or shortness of breath. 75 mL 2   ammonium lactate (AMLACTIN) 12 % lotion Apply 1 application topically as needed for dry skin. 400 g 3   aspirin EC 81 MG tablet Take 1 tablet (81 mg total) by mouth daily. 90 tablet 3   budesonide-formoterol (SYMBICORT) 160-4.5 MCG/ACT inhaler Inhale 2 puffs into the lungs 2 (two) times daily. 1 each 3   clopidogrel (PLAVIX) 75 MG tablet TAKE 1 TABLET(75 MG) BY MOUTH DAILY WITH BREAKFAST 30 tablet 6   cyclobenzaprine (FLEXERIL) 10 MG tablet Take 1 tablet (10 mg total) by mouth 2 (two) times daily. 60 tablet 5   dexlansoprazole (DEXILANT) 60 MG capsule Take 1 capsule (60 mg total) by mouth daily. 30 capsule 11   DULoxetine (CYMBALTA) 30 MG capsule Take 1 capsule (30 mg total) by mouth daily. 90 capsule 1   DULoxetine (CYMBALTA) 60 MG capsule Take 1 capsule (60 mg total) by mouth daily. 90 capsule 1   ezetimibe (ZETIA) 10 MG tablet TAKE 1 TABLET(10 MG) BY MOUTH DAILY 90 tablet 0   furosemide (LASIX) 20 MG tablet TAKE 1 TABLET BY MOUTH DAILY AS NEEDED FOR WEIGHT GAIN OF 2 POUNDS OVERNIGHT OR 5 POUNDS PER WEEK 90 tablet 0   gabapentin (NEURONTIN) 300 MG capsule Take 1 capsule (300 mg total) by mouth 3 (three) times daily. 90 capsule 1   gabapentin (NEURONTIN) 800 MG tablet Take 800 mg by mouth at bedtime.     insulin lispro (HUMALOG) 100 UNIT/ML KiwkPen Inject 7-8 Units into the skin 3 (three) times daily. Reported on 12/02/2015/ sliding scale 1 unit for every 7 units of carbs; and 1 unit for every 20 above 120     Insulin Pen Needle 32G X 4 MM MISC  Inject 1 each into the skin as needed.     JARDIANCE 10 MG TABS tablet TAKE 1 TABLET(10 MG) BY MOUTH DAILY BEFORE BREAKFAST  30 tablet 4   LANTUS SOLOSTAR 100 UNIT/ML Solostar Pen Inject 23-26 Units into the skin daily at 10 pm.  0   levocetirizine (XYZAL) 5 MG tablet TAKE 1 TABLET(5 MG) BY MOUTH EVERY EVENING 30 tablet 3   levothyroxine (SYNTHROID) 50 MCG tablet TAKE 1 TABLET BY MOUTH DAILY MONDAY THROUGH SATURDAY AND 2 TABLETS EVERY SUNDAY, TAKE BEFORE BREAKFAST(RECHECK LEVELS IN 6 WEEKS) 102 tablet 0   loratadine (CLARITIN) 10 MG tablet Take 10 mg by mouth daily.      metFORMIN (GLUCOPHAGE) 1000 MG tablet Take 1,000 mg by mouth 2 (two) times daily with a meal.     metoprolol succinate (TOPROL-XL) 50 MG 24 hr tablet Take 1 tablet (50 mg total) by mouth daily. Take with or immediately following a meal. 90 tablet 0   montelukast (SINGULAIR) 10 MG tablet Take 1 tablet by mouth at bedtime.     Multiple Vitamins-Minerals (MENS MULTI VITAMIN & MINERAL PO) Take 1 tablet by mouth in the morning and at bedtime.     naloxone (NARCAN) nasal spray 4 mg/0.1 mL For excess sedation from opioids 1 kit 2   nitroGLYCERIN (NITROSTAT) 0.3 MG SL tablet Place 1 tablet (0.3 mg total) under the tongue every 5 (five) minutes as needed for chest pain. Maximum 3 doses. 25 tablet 3   [START ON 12/29/2021] Oxycodone HCl 10 MG TABS Take 1 tablet (10 mg total) by mouth 4 (four) times daily. 120 tablet 0   OZEMPIC, 1 MG/DOSE, 4 MG/3ML SOPN Inject 1 mg into the skin once a week.     rosuvastatin (CRESTOR) 5 MG tablet Take 5 mg by mouth daily.  0   tamsulosin (FLOMAX) 0.4 MG CAPS capsule TAKE 2 CAPSULES BY MOUTH DAILY 60 capsule 2   lubiprostone (AMITIZA) 24 MCG capsule Take 1 capsule (24 mcg total) by mouth 2 (two) times daily with a meal. (Patient not taking: Reported on 12/10/2021) 180 capsule 1   Oxycodone HCl 10 MG TABS Take 1 tablet (10 mg total) by mouth in the morning, at noon, in the evening, and at bedtime. (Patient not  taking: Reported on 12/10/2021) 120 tablet 0   No current facility-administered medications for this visit.     Review of Systems    As outlined above, he has been experiencing orthostatic lightheadedness with low blood pressures over the past 2 months.  He has also been experiencing some swelling above his socks at the end of the day for which, he has been taking Lasix 20 mg every other day.  He has chronic, stable angina, which he notes has been well controlled.  He denies dyspnea, palpitations, PND, orthopnea, syncope, or early satiety.  All other systems reviewed and are otherwise negative except as noted above.    Physical Exam    VS:  BP 95/67 (BP Location: Left Arm, Patient Position: Sitting, Cuff Size: Normal)    Pulse 97    Ht $R'5\' 7"'Rx$  (1.702 m)    Wt 201 lb 6 oz (91.3 kg)    BMI 31.54 kg/m  , BMI Body mass index is 31.54 kg/m.    Orthostatic VS for the past 24 hrs:  BP- Lying Pulse- Lying BP- Sitting Pulse- Sitting BP- Standing at 0 minutes Pulse- Standing at 0 minutes  12/10/21 1410 98/66 102 (!) 83/56 100 (!) 68/43 99     GEN: Well nourished, well developed, in no acute distress. HEENT: normal. Neck: Supple, no JVD, carotid bruits, or masses. Cardiac: RRR, no murmurs, rubs,  or gallops. No clubbing, cyanosis, trace bilateral ankle edema just above his sock line.  Radials 2+/PT 1+ and equal bilaterally.  Respiratory:  Respirations regular and unlabored, clear to auscultation bilaterally. GI: Soft, nontender, nondistended, BS + x 4. MS: no deformity or atrophy. Skin: warm and dry, no rash. Neuro:  Strength and sensation are intact. Psych: Normal affect.  Accessory Clinical Findings    ECG personally reviewed by me today -regular sinus rhythm, 97, rightward axis, right bundle branch block, inferior and anterolateral infarcts - no acute changes.  Lab Results  Component Value Date   WBC 8.6 09/23/2021   HGB 12.7 (L) 09/23/2021   HCT 39.7 09/23/2021   MCV 87.3 09/23/2021    PLT 185 09/23/2021   Lab Results  Component Value Date   CREATININE 1.30 (H) 10/31/2021   BUN 25 09/23/2021   NA 139 09/23/2021   K 4.4 09/23/2021   CL 103 09/23/2021   CO2 28 09/23/2021   Lab Results  Component Value Date   ALT 21 09/23/2021   AST 18 09/23/2021   ALKPHOS 99 07/17/2021   BILITOT 0.4 09/23/2021   Lab Results  Component Value Date   CHOL 92 (L) 07/17/2021   HDL 28 (L) 07/17/2021   LDLCALC 39 07/17/2021   LDLDIRECT 36 07/17/2021   TRIG 145 07/17/2021   CHOLHDL 3.3 07/17/2021    Lab Results  Component Value Date   HGBA1C 7.6 (A) 10/21/2021    Assessment & Plan    1.  Orthostatic hypotension: Over the past 2 months, patient has been experiencing orthostasis and lightheadedness, associated with lower blood pressures at home.  His metoprolol dose was recently reduced from 100 down to 50 mg daily.  He is orthostatic on examination today.  In discussing this with him today, whereas he was not previously using Lasix at all, he has been taking Lasix 20 mg every other day in the setting of mild dependent edema, that he notes above his sock line at the end of the day.  I note that he had lab work in late January which showed a creatinine of 1.30.  I suspect that between his significant weight loss over the past 2 years while on Ozempic, and more frequent use of Lasix, that he may be dehydrating himself.  I will follow-up a CBC and basic metabolic panel today.  In the setting of dependent edema only, I have encouraged him to keep his legs elevated, watch his sodium intake, and use compression while reserving Lasix usage for weight gain of 2 pounds in 24 hours.  Pending lab work and his response to holding Lasix, we may need to reduce beta-blocker dose further but would like to see how he does off of diuretic first.  He and his wife were agreeable with this plan.  2.  Coronary artery disease: Status post multiple prior stents with his last intervention occurring in the left  circumflex in 2019.  He has chronic, stable angina and only has had to use nitroglycerin about 2 times since his last visit here, which he says is good for him.  He does not experience dyspnea.  He remains on aspirin, Plavix, beta-blocker, statin, and Zetia therapy.  3.  Chronic heart failure with reduced ejection fraction/ischemic cardiomyopathy: EF 35 to 40% by echo in March 2022.  He has had some dependent edema as outlined above however, his weight has continued to come down.  Beta-blocker dose recently reduced secondary to orthostatic hypotension.  He is orthostatic  today, thus continuing to prevent use of GDMT (he previously did not tolerate Entresto).  Continue beta-blocker and empagliflozin.  4.  Essential hypertension: By history only, as above, pressures have been soft.  5.  Hyperlipidemia: LDL of 36 in October with normal LFTs in December.  He remains on rosuvastatin and Zetia therapy.  6.  Type 2 diabetes mellitus: A1c 7.6 in January.  On multiple agents and followed closely by endocrinology.  7.  Obstructive sleep apnea: Compliant with CPAP.  8.  Iron deficiency anemia: Previously receiving iron infusions.  Follow-up CBC today in the setting of orthostasis and hypotension.  9.  Disposition: Follow-up CBC and basic metabolic panel today.  Follow-up in clinic in 1 to 2 months or sooner if necessary.   Murray Hodgkins, NP 12/10/2021, 5:28 PM

## 2021-12-11 ENCOUNTER — Ambulatory Visit: Payer: Medicare Other | Admitting: Podiatry

## 2021-12-11 LAB — BASIC METABOLIC PANEL
BUN/Creatinine Ratio: 18 (ref 10–24)
BUN: 22 mg/dL (ref 8–27)
CO2: 22 mmol/L (ref 20–29)
Calcium: 9.3 mg/dL (ref 8.6–10.2)
Chloride: 101 mmol/L (ref 96–106)
Creatinine, Ser: 1.25 mg/dL (ref 0.76–1.27)
Glucose: 253 mg/dL — ABNORMAL HIGH (ref 70–99)
Potassium: 4.9 mmol/L (ref 3.5–5.2)
Sodium: 141 mmol/L (ref 134–144)
eGFR: 65 mL/min/{1.73_m2} (ref >59)

## 2021-12-11 LAB — CBC
Hematocrit: 40.4 % (ref 37.5–51.0)
Hemoglobin: 12.8 g/dL — ABNORMAL LOW (ref 13.0–17.7)
MCH: 28.8 pg (ref 26.6–33.0)
MCHC: 31.7 g/dL (ref 31.5–35.7)
MCV: 91 fL (ref 79–97)
Platelets: 270 10*3/uL (ref 150–450)
RBC: 4.45 x10E6/uL (ref 4.14–5.80)
RDW: 15.3 % (ref 11.6–15.4)
WBC: 7.2 10*3/uL (ref 3.4–10.8)

## 2021-12-16 ENCOUNTER — Other Ambulatory Visit: Payer: Self-pay | Admitting: *Deleted

## 2021-12-16 DIAGNOSIS — D500 Iron deficiency anemia secondary to blood loss (chronic): Secondary | ICD-10-CM

## 2021-12-18 ENCOUNTER — Telehealth: Payer: Self-pay | Admitting: *Deleted

## 2021-12-18 NOTE — Telephone Encounter (Signed)
Pt left vm to inquire about the apts on Monday and the purpose of the apt. Phone call returned. Left vm and also sent a mychart msg to patient. ?

## 2021-12-22 ENCOUNTER — Inpatient Hospital Stay: Payer: Medicare Other | Attending: Oncology

## 2021-12-22 DIAGNOSIS — D509 Iron deficiency anemia, unspecified: Secondary | ICD-10-CM | POA: Diagnosis present

## 2021-12-22 DIAGNOSIS — I5042 Chronic combined systolic (congestive) and diastolic (congestive) heart failure: Secondary | ICD-10-CM | POA: Insufficient documentation

## 2021-12-22 DIAGNOSIS — E1136 Type 2 diabetes mellitus with diabetic cataract: Secondary | ICD-10-CM | POA: Insufficient documentation

## 2021-12-22 DIAGNOSIS — R634 Abnormal weight loss: Secondary | ICD-10-CM | POA: Insufficient documentation

## 2021-12-22 DIAGNOSIS — Z8042 Family history of malignant neoplasm of prostate: Secondary | ICD-10-CM | POA: Insufficient documentation

## 2021-12-22 DIAGNOSIS — I110 Hypertensive heart disease with heart failure: Secondary | ICD-10-CM | POA: Diagnosis not present

## 2021-12-22 DIAGNOSIS — D500 Iron deficiency anemia secondary to blood loss (chronic): Secondary | ICD-10-CM

## 2021-12-22 LAB — CBC WITH DIFFERENTIAL/PLATELET
Abs Immature Granulocytes: 0.07 10*3/uL (ref 0.00–0.07)
Basophils Absolute: 0 10*3/uL (ref 0.0–0.1)
Basophils Relative: 0 %
Eosinophils Absolute: 0.1 10*3/uL (ref 0.0–0.5)
Eosinophils Relative: 1 %
HCT: 44.2 % (ref 39.0–52.0)
Hemoglobin: 14.2 g/dL (ref 13.0–17.0)
Immature Granulocytes: 1 %
Lymphocytes Relative: 12 %
Lymphs Abs: 1.3 10*3/uL (ref 0.7–4.0)
MCH: 29.3 pg (ref 26.0–34.0)
MCHC: 32.1 g/dL (ref 30.0–36.0)
MCV: 91.3 fL (ref 80.0–100.0)
Monocytes Absolute: 0.4 10*3/uL (ref 0.1–1.0)
Monocytes Relative: 4 %
Neutro Abs: 8.9 10*3/uL — ABNORMAL HIGH (ref 1.7–7.7)
Neutrophils Relative %: 82 %
Platelets: 262 10*3/uL (ref 150–400)
RBC: 4.84 MIL/uL (ref 4.22–5.81)
RDW: 15.7 % — ABNORMAL HIGH (ref 11.5–15.5)
WBC: 10.8 10*3/uL — ABNORMAL HIGH (ref 4.0–10.5)
nRBC: 0 % (ref 0.0–0.2)

## 2021-12-22 LAB — IRON AND TIBC
Iron: 79 ug/dL (ref 45–182)
Saturation Ratios: 22 % (ref 17.9–39.5)
TIBC: 353 ug/dL (ref 250–450)
UIBC: 274 ug/dL

## 2021-12-22 LAB — FERRITIN: Ferritin: 65 ng/mL (ref 24–336)

## 2021-12-24 ENCOUNTER — Inpatient Hospital Stay: Payer: Medicare Other

## 2021-12-24 ENCOUNTER — Other Ambulatory Visit: Payer: Self-pay

## 2021-12-24 ENCOUNTER — Encounter: Payer: Self-pay | Admitting: Oncology

## 2021-12-24 ENCOUNTER — Inpatient Hospital Stay: Payer: Medicare Other | Admitting: Oncology

## 2021-12-24 VITALS — BP 121/78 | HR 95 | Temp 97.8°F | Resp 16 | Wt 197.7 lb

## 2021-12-24 DIAGNOSIS — R634 Abnormal weight loss: Secondary | ICD-10-CM

## 2021-12-24 DIAGNOSIS — D500 Iron deficiency anemia secondary to blood loss (chronic): Secondary | ICD-10-CM

## 2021-12-24 DIAGNOSIS — D509 Iron deficiency anemia, unspecified: Secondary | ICD-10-CM | POA: Diagnosis not present

## 2021-12-24 NOTE — Progress Notes (Signed)
Pt in for follow up, reports continues to lose weight.  Last weight on 12/11/19 and pt is down 4 lbs.  Reports fair appetite.  States very fatigued all the time. ?

## 2021-12-24 NOTE — Progress Notes (Signed)
?Hematology/Oncology Progress note ?[phone] [phone] ?  ? ?   ? ? ?Patient Care Team: ?[name], MD as PCP - General (Family Medicine) ?[name], MD as PCP - Cardiology (Cardiology) ?Erby Pian, MD as Consulting Physician (Pulmonary Disease) ?Lonia Farber, MD as Consulting Physician (Endocrinology) ?[name], MD as Consulting Physician (Gastroenterology) ?Germaine Pomfret, [facility] (Pharmacist) ?Molli Barrows, MD as Consulting Physician (Pain Medicine) ?[place], [place], [place] as Education officer, museum ?[name], MD as Consulting Physician (Cardiology) ? ?REFERRING PROVIDER: ?[name], MD  ?CHIEF COMPLAINTS/REASON FOR VISIT:  ? iron deficiency anemia, weight loss ? ?HISTORY OF PRESENTING ILLNESS:  ? ?[name] is a  61 y.o.  male with PMH listed below was seen in consultation at the request of  [name], MD  for evaluation of iron deficiency anemia ? ?Patient had a CBC done on 04/02/2021, CBC showed a hemoglobin of 11.7, MCV 84.5.  Platelets 247 and WBC 8.1. ?04/14/2021, patient has had upper endoscopy and colonoscopy. ?EGD showed gastritis and duodenitis, stomach biopsy showed antral mucosa with moderate chronic and focally active gastritis.  Negative for intestinal metaplasia, dysplasia and malignancy.  Duodenum biopsy showed duodenal mucosa with peptic duodenitis.  Negative for dysplasia and malignancy..  Colonoscopy showed nonbleeding internal hemorrhoids. ?Patient has capsule study scheduled in the near future to complete his GI work-up.   ?He was accompanied by his wife today.  Patient has had lost weight since earlier this year.  He has nausea and has decreased appetite.  He has asthma and he feels congested recently.  Some cough. ?+ Fatigue, decreased exercise induration denies any black bowel movements or blood in the stool. ? ?INTERVAL HISTORY ?[name] is a 61 y.o. male who has above history reviewed by me today  presents for follow up visit for iron deficient anemia and weight loss. ?Patient reports that he continues to lose weight.  Does not have good appetite.  No nausea vomiting. ? ?09/22/2021, capsule study was not completed. ? ?Review of Systems  ?Constitutional:  Positive for appetite change, fatigue and unexpected weight change. Negative for chills and fever.  ?HENT:   Negative for hearing loss and voice change.   ?Eyes:  Negative for eye problems and icterus.  ?Respiratory:  Negative for chest tightness, cough and shortness of breath.   ?Cardiovascular:  Negative for chest pain and leg swelling.  ?Gastrointestinal:  Negative for abdominal distention, abdominal pain, blood in stool, nausea and vomiting.  ?Endocrine: Negative for hot flashes.  ?Genitourinary:  Negative for difficulty urinating, dysuria and frequency.   ?Musculoskeletal:  Negative for arthralgias.  ?Skin:  Negative for itching and rash.  ?Neurological:  Negative for light-headedness and numbness.  ?Hematological:  Negative for adenopathy. Does not bruise/bleed easily.  ?Psychiatric/Behavioral:  Negative for confusion.   ? ?MEDICAL HISTORY:  ?Past Medical History:  ?Diagnosis Date  ? Allergy   ? dust, seasonal (worse in the fall).  ? Anemia   ? Arthritis   ? 2/2 Lyme Disease. Followed by Pain Specialist in [place], back and neck  ? Asthma   ? BRONCHITIS  ? Cataract   ? First Dx in 2012  ? Chronic combined systolic and diastolic congestive heart failure ([place])   ? a. 03/2018 Echo: EF 30-35%; b. 07/2018 Echo: EF 35-40%; c. 09/2019 TEE: EF 40-45%; d. 12/2020 Echo: EF 35-40%, sev apical ant, apical lat, apical inf, apical HK. GrI DD, nl RV fxn.  ? Coronary artery disease   ?  a. Prior Ant MI->s/p multiple stents placed in the LAD and right coronary artery ([place]); b. 2016 Cath: reportedly nonobs dzs;  c. 04/2018 Cath/PCI: LM nl, LAD 20p, patent mid stent, LCX [id number] Sierra DES), OM1 nl, OM2 50, OM3 40 w/ patent stent, RCA 40p, 268m40d w/ patent stent in  RPDA, RPAV 60, EF 25-35%. 2+MR; d. 02/2019 MV: Apical scar, no isch, EF 30-44%.  ? Deaf, left   ? Diabetes mellitus without complication ([place]  ? TYPE 2  ? Diabetic peripheral neuropathy ([place]CF[place]  ? feet and hands  ? FUO (fever of unknown origin) 08/03/2018  ? GERD (gastroesophageal reflux disease)   ? Headache   ? muscle tension  ? Hyperlipidemia   ? Hypertension   ? CONTROLLED ON MEDS  ? Hyperthyroidism   ? Insomnia   ? Ischemic cardiomyopathy   ? a. 03/2018 Echo: EF 30-35%, ant, antlat, apical AK, Gr1 DD; b. 07/2018 Echo: EF 35-40%, anteroseptal, apical, and ant HK. Gr1 DD; c. 09/2019 TEE: EF 40-45%; d. 12/2020 Echo: EF 35-40%, sev apical ant, apical lat, apical inf, apical HK. GrI DD, nl RV fxn.  ? Knee pain, acute 05/06/2020  ? Left arm weakness 10/04/2019  ? Left leg weakness 12/01/2019  ? Lyme disease   ? Chronic  ? Myasthenia gravis ([place]  ? (03/18/21 - no current treatment - "better than it has ever been" per pt)  ? [facility]  ? Palpitations   ? a. 10/2020 Zio: RSR, 88 avg. 3 brief SVT episodes (max 5 beats @ 128). Rare PACs/PVCs. Triggered events did not correlate w/ significant arrthymia - some w/ sinus tach.  ? Seasonal allergies   ? Sepsis ([place]  ? a.07/2018 - unknown source. TEE neg for veg 09/2019.  ? Sleep apnea   ? CPAP  ? Wears hearing aid in both ears   ? ? ?SURGICAL HISTORY: ?Past Surgical History:  ?Procedure Laterality Date  ? BILATERAL CARPAL TUNNEL RELEASE Bilateral L in 2012 and R in 2013  ? CARDIAC CATHETERIZATION    ? Several Caths, most recent in  March 2016.  ? COLONOSCOPY WITH PROPOFOL N/A 01/10/2016  ? Procedure: COLONOSCOPY WITH PROPOFOL;  Surgeon: DaLucilla LameMD;  Location: [facility] ENDOSCOPY;  Service: Endoscopy;  Laterality: N/A;  ? COLONOSCOPY WITH PROPOFOL N/A 04/14/2021  ? Procedure: COLONOSCOPY WITH PROPOFOL;  Surgeon: WoLucilla LameMD;  [place]: [place]oscopy;  Laterality: N/A;  Diabetic - insulin and oral meds  ? CORONARY ANGIOPLASTY     ? CORONARY STENT INTERVENTION N/A 04/25/2018  ? Procedure: CORONARY STENT INTERVENTION;  Surgeon: ArWellington HampshireMD;  [place] [facility];  Service: Cardiovascular;  Laterality: N/A;  ? ESOPHAGOGASTRODUODENOSCOPY (EGD) WITH PROPOFOL N/A 01/10/2016  ? Procedure: ESOPHAGOGASTRODUODENOSCOPY (EGD) WITH PROPOFOL;  Surgeon: DaLucilla LameMD;  Location: [facility] ENDOSCOPY;  Service: Endoscopy;  Laterality: N/A;  ? ESOPHAGOGASTRODUODENOSCOPY (EGD) WITH PROPOFOL N/A 04/14/2021  ? Procedure: ESOPHAGOGASTRODUODENOSCOPY (EGD) WITH BIOPSY;  Surgeon: WoLucilla LameMD;  [place] Service: Endoscopy;  Laterality: N/A;  ? EYE SURGERY Bilateral 2012  ? cataract/bilateral vitrectomies  ? GIVENS CAPSULE STUDY N/A 08/20/2021  ? Procedure: GIVENS CAPSULE STUDY;  Surgeon: WoLucilla LameMD;  [facility];  Service: Endoscopy;  Laterality: N/A;  ? LEFT HEART CATH AND CORONARY ANGIOGRAPHY Left 04/25/2018  ? Procedure: LEFT HEART CATH AND CORONARY ANGIOGRAPHY;  Surgeon: ArWellington HampshireMD;  [place] [facility];  Service:  Cardiovascular;  Laterality: Left;  ? TEE WITHOUT CARDIOVERSION N/A 09/05/2018  ? Procedure: TRANSESOPHAGEAL ECHOCARDIOGRAM (TEE);  Surgeon: [name], MD;  Location: [facility] ORS;  Service: Cardiovascular;  Laterality: N/A;  ? TONSILLECTOMY AND ADENOIDECTOMY    ? As a child  ? TUNNELED VENOUS CATHETER PLACEMENT    ? removed  ? ? ?SOCIAL HISTORY: ?Social History  ? ?Socioeconomic History  ? Marital status: Married  ?  Spouse name: Neoma Laming  ? Number of children: 0  ? Years of education: Not on file  ? Highest education level: Master's degree (e.g., MA, MS, MEng, MEd, MSW, MBA)  ?Occupational History  ? Occupation: disabled  ?  Comment: multiple medical problems - first approved for uncontrolled DM, but now has heart disease and Myasthenia Gravis   ?Tobacco Use  ? Smoking status: Never  ? Smokeless tobacco: Never  ? Tobacco comments:  ?  smoking cessation materials not  required  ?Vaping Use  ? Vaping Use: Never used  ?Substance and Sexual Activity  ? Alcohol use: Not Currently  ?  Alcohol/week: 0.0 standard drinks  ? Drug use: No  ? Sexual activity: Not Currently  ?  Partners:

## 2021-12-30 ENCOUNTER — Other Ambulatory Visit: Payer: Self-pay | Admitting: Cardiovascular Disease

## 2021-12-30 DIAGNOSIS — I5022 Chronic systolic (congestive) heart failure: Secondary | ICD-10-CM

## 2022-01-04 ENCOUNTER — Other Ambulatory Visit: Payer: Self-pay | Admitting: Family Medicine

## 2022-01-04 DIAGNOSIS — F331 Major depressive disorder, recurrent, moderate: Secondary | ICD-10-CM

## 2022-01-04 DIAGNOSIS — F411 Generalized anxiety disorder: Secondary | ICD-10-CM

## 2022-01-05 ENCOUNTER — Other Ambulatory Visit: Payer: Self-pay

## 2022-01-05 ENCOUNTER — Ambulatory Visit: Payer: Medicare Other | Attending: Anesthesiology | Admitting: Anesthesiology

## 2022-01-05 DIAGNOSIS — F1190 Opioid use, unspecified, uncomplicated: Secondary | ICD-10-CM

## 2022-01-05 DIAGNOSIS — R29898 Other symptoms and signs involving the musculoskeletal system: Secondary | ICD-10-CM

## 2022-01-05 DIAGNOSIS — M545 Low back pain, unspecified: Secondary | ICD-10-CM | POA: Diagnosis not present

## 2022-01-05 DIAGNOSIS — M542 Cervicalgia: Secondary | ICD-10-CM

## 2022-01-05 DIAGNOSIS — M5136 Other intervertebral disc degeneration, lumbar region: Secondary | ICD-10-CM | POA: Diagnosis not present

## 2022-01-05 DIAGNOSIS — G894 Chronic pain syndrome: Secondary | ICD-10-CM

## 2022-01-05 DIAGNOSIS — M4716 Other spondylosis with myelopathy, lumbar region: Secondary | ICD-10-CM | POA: Diagnosis not present

## 2022-01-05 MED ORDER — OXYCODONE HCL 10 MG PO TABS
10.0000 mg | ORAL_TABLET | Freq: Four times a day (QID) | ORAL | 0 refills | Status: AC
Start: 2022-01-28 — End: 2022-02-27

## 2022-01-05 MED ORDER — OXYCODONE HCL 10 MG PO TABS: 10.0000 mg | ORAL_TABLET | Freq: Four times a day (QID) | ORAL | 0 refills | Status: DC

## 2022-01-05 NOTE — Telephone Encounter (Signed)
Lft message to set patient up with sowles for an appt per sowles

## 2022-01-05 NOTE — Progress Notes (Signed)
Virtual Visit via Telephone Note ? ?I connected with [name] on 01/05/22 at  1:30 PM EDT by telephone and verified that I am speaking with the correct person using two identifiers. ? ?Location: ?Patient: Home ?Provider: Pain control center ?  ?I discussed the limitations, risks, security and privacy concerns of performing an evaluation and management service by telephone and the availability of in person appointments. I also discussed with the patient that there may be a patient responsible charge related to this service. The patient expressed understanding and agreed to proceed. ? ? ?History of Present Illness: ? ?I spoke with [name] via telephone today as we are unable link for the video portion of the Reports that he is doing quite well following his last epidural back in February.  He is no longer having as significant sciatic pain and the low back pain has responded favorably as well he would rated at the 75% improvement overall.  He still has some difficulty when he does a lot of rotational motion or prolonged standing with the low back but this is generally under decent control.  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  He has centralized neck pain primarily at the base of the head with some associated popping and cracking but no problems with strength to the upper extremities.  He still taking his oxycodone with good success and no side effects.  Otherwise he is in his usual state of health at this time. ? ?Review of systems: ?General: No fevers or chills ?Pulmonary: No shortness of breath or dyspnea ?Cardiac: No angina or palpitations or lightheadedness ?GI: No abdominal pain or constipation ?Psych: No depression  ?Observations/Objective: ? ?Current Outpatient Medications:  ?  [START ON 02/27/2022] Oxycodone HCl 10 MG TABS, Take 1 tablet (10 mg total) by mouth in the morning, at noon, in the evening, and at bedtime., Disp: 120  tablet, Rfl: 0 ?  albuterol (PROVENTIL) (2.5 MG/3ML) 0.083% nebulizer solution, Take 3 mLs (2.5 mg total) by nebulization every 6 (six) hours as needed for wheezing or shortness of breath., Disp: 75 mL, Rfl: 2 ?  ammonium lactate (AMLACTIN) 12 % lotion, Apply 1 application topically as needed for dry skin., Disp: 400 g, Rfl: 3 ?  amoxicillin-clavulanate (AUGMENTIN) 500-125 MG tablet, Take 1 tablet by mouth 2 (two) times daily., Disp: , Rfl:  ?  aspirin EC 81 MG tablet, Take 1 tablet (81 mg total) by mouth daily., Disp: 90 tablet, Rfl: 3 ?  budesonide-formoterol (SYMBICORT) 160-4.5 MCG/ACT inhaler, Inhale 2 puffs into the lungs 2 (two) times daily., Disp: 1 each, Rfl: 3 ?  clopidogrel (PLAVIX) 75 MG tablet, TAKE 1 TABLET(75 MG) BY MOUTH DAILY WITH BREAKFAST, Disp: 30 tablet, Rfl: 6 ?  cyclobenzaprine (FLEXERIL) 10 MG tablet, Take 10 mg by mouth 3 (three) times daily as needed for muscle spasms., Disp: , Rfl:  ?  dexlansoprazole (DEXILANT) 60 MG capsule, Take 1 capsule (60 mg total) by mouth daily., Disp: 30 capsule, Rfl: 11 ?  DULoxetine (CYMBALTA) 30 MG capsule, Take 1 capsule (30 mg total) by mouth daily., Disp: 90 capsule, Rfl: 1 ?  DULoxetine (CYMBALTA) 60 MG capsule, Take 1 capsule (60 mg total) by mouth daily., Disp: 90 capsule, Rfl: 1 ?  DULoxetine (CYMBALTA) 60 MG capsule, Take by mouth., Disp: , Rfl:  ?  ezetimibe (ZETIA) 10 MG tablet, TAKE 1 TABLET(10 MG) BY MOUTH DAILY, Disp: 90 tablet, Rfl: 0 ?  furosemide (LASIX) 20 MG tablet, TAKE 1 TABLET BY MOUTH DAILY AS NEEDED FOR WEIGHT GAIN OF 2 POUNDS OVERNIGHT OR 5 POUNDS PER WEEK (Patient not taking: Reported on 12/24/2021), Disp: 90 tablet, Rfl: 0 ?  gabapentin (NEURONTIN) 300 MG capsule, Take 1 capsule (300 mg total) by mouth 3 (three) times daily., Disp: 90 capsule, Rfl: 1 ?  gabapentin (NEURONTIN) 800 MG tablet, Take 800 mg by mouth at bedtime., Disp: , Rfl:  ?  insulin lispro (HUMALOG) 100 UNIT/ML KiwkPen, Inject 7-8 Units into the skin 3 (three) times  daily. Reported on 12/02/2015/ sliding scale 1 unit for every 7 units of carbs; and 1 unit for every 20 above 120, Disp: , Rfl:  ?  Insulin Pen Needle 32G X 4 MM MISC, Inject 1 each into the skin as needed., Disp: , Rfl:  ?  JARDIANCE 10 MG TABS tablet, TAKE 1 TABLET(10 MG) BY MOUTH DAILY BEFORE BREAKFAST, Disp: 30 tablet, Rfl: 4 ?  LANTUS SOLOSTAR 100 UNIT/ML Solostar Pen, Inject 23-26 Units into the skin daily at 10 pm., Disp: , Rfl: 0 ?  levocetirizine (XYZAL) 5 MG tablet, TAKE 1 TABLET(5 MG) BY MOUTH EVERY EVENING, Disp: 30 tablet, Rfl: 3 ?  levothyroxine (SYNTHROID) 50 MCG tablet, TAKE 1 TABLET BY MOUTH DAILY MONDAY THROUGH SATURDAY AND 2 TABLETS EVERY SUNDAY, TAKE BEFORE BREAKFAST(RECHECK LEVELS IN 6 WEEKS), Disp: 102 tablet, Rfl: 0 ?  loratadine (CLARITIN) 10 MG tablet, Take 10 mg by mouth daily. , Disp: , Rfl:  ?  metFORMIN (GLUCOPHAGE) 1000 MG tablet, Take 1,000 mg by mouth 2 (two) times daily with a meal., Disp: , Rfl:  ?  metoprolol succinate (TOPROL-XL) 50 MG 24 hr tablet, Take 1 tablet (50 mg total) by mouth daily. Take with or immediately following a meal., Disp: 90 tablet, Rfl: 0 ?  montelukast (SINGULAIR) 10 MG tablet, Take 1 tablet by mouth at bedtime., Disp: , Rfl:  ?  Multiple Vitamins-Minerals (MENS MULTI VITAMIN & MINERAL PO), Take 1 tablet by mouth in the morning and at bedtime., Disp: , Rfl:  ?  naloxone (NARCAN) nasal spray 4 mg/0.1 mL, For excess sedation from opioids (Patient not taking: Reported on 12/24/2021), Disp: 1 kit, Rfl: 2 ?  nitroGLYCERIN (NITROSTAT) 0.3 MG SL tablet, Place 1 tablet (0.3 mg total) under the tongue every 5 (five) minutes as needed for chest pain. Maximum 3 doses. (Patient not taking: Reported on 12/24/2021), Disp: 25 tablet, Rfl: 3 ?  [START ON 01/28/2022] Oxycodone HCl 10 MG TABS, Take 1 tablet (10 mg total) by mouth 4 (four) times daily., Disp: 120 tablet, Rfl: 0 ?  OZEMPIC, 1 MG/DOSE, 4 MG/3ML SOPN, Inject 1 mg into the skin once a week., Disp: , Rfl:  ?   rosuvastatin (CRESTOR) 5 MG tablet, Take 5 mg by mouth daily., Disp: , Rfl: 0 ?  tamsulosin (FLOMAX) 0.4 MG CAPS capsule, TAKE 2 CAPSULES BY MOUTH DAILY (Patient not taking: Reported on 12/24/2021), Disp: 60 capsule, Rfl: 2  ? ?Past Medical History:  ?Diagnosis Date  ? Allergy   ? dust, seasonal (worse in the fall).  ? Anemia   ? Arthritis   ? 2/2 Lyme Disease. Followed by Pain Specialist in [place], back and neck  ? Asthma   ? BRONCHITIS  ? Cataract   ? First Dx in 2012  ? Chronic combined systolic and diastolic congestive heart failure ([place])   ? a. 03/2018 Echo: EF 30-35%; b. 07/2018 Echo: EF 35-40%; c. 09/2019 TEE: EF 40-45%; d.  12/2020 Echo: EF 35-40%, sev apical ant, apical lat, apical inf, apical HK. GrI DD, nl RV fxn.  ? Coronary artery disease   ? a. Prior Ant MI->s/p multiple stents placed in the LAD and right coronary artery ([place]); b. 2016 Cath: reportedly nonobs dzs;  c. 04/2018 Cath/PCI: LM nl, LAD 20p, patent mid stent, LCX [id number] Sierra DES), OM1 nl, OM2 50, OM3 40 w/ patent stent, RCA 40p, 245m40d w/ patent stent in RPDA, RPAV 60, EF 25-35%. 2+MR; d. 02/2019 MV: Apical scar, no isch, EF 30-44%.  ? Deaf, left   ? Diabetes mellitus without complication ([place]  ? TYPE 2  ? Diabetic peripheral neuropathy ([place]  ? feet and hands  ? FUO (fever of unknown origin) 08/03/2018  ? GERD (gastroesophageal reflux disease)   ? Headache   ? muscle tension  ? Hyperlipidemia   ? Hypertension   ? CONTROLLED ON MEDS  ? Hyperthyroidism   ? Insomnia   ? Ischemic cardiomyopathy   ? a. 03/2018 Echo: EF 30-35%, ant, antlat, apical AK, Gr1 DD; b. 07/2018 Echo: EF 35-40%, anteroseptal, apical, and ant HK. Gr1 DD; c. 09/2019 TEE: EF 40-45%; d. 12/2020 Echo: EF 35-40%, sev apical ant, apical lat, apical inf, apical HK. GrI DD, nl RV fxn.  ? Knee pain, acute 05/06/2020  ? Left arm weakness 10/04/2019  ? Left leg weakness 12/01/2019  ? Lyme disease   ? Chronic  ? Myasthenia gravis ([place]CM[place]  ? (03/18/21 - no current treatment - "better  than it has ever been" per pt)  ? [facility]  ? Palpitations   ? a. 10/2020 Zio: RSR, 88 avg. 3 brief SVT episodes (max 5 beats @ 128). Rare PACs/PVCs. Triggered events did not correlate w/ sign

## 2022-01-06 ENCOUNTER — Telehealth: Payer: Medicare Other | Admitting: Anesthesiology

## 2022-01-08 ENCOUNTER — Encounter: Payer: Self-pay | Admitting: Cardiovascular Disease

## 2022-01-08 ENCOUNTER — Telehealth: Payer: Self-pay | Admitting: Cardiovascular Disease

## 2022-01-08 NOTE — Telephone Encounter (Signed)
Pt c/o BP issue: STAT if pt c/o blurred vision, one-sided weakness or slurred speech ? ?1. What are your last 5 BP readings? 75/22 ? ?2. Are you having any other symptoms (ex. Dizziness, headache, blurred vision, passed out)? Dizziness when standing ? ?3. What is your BP issue? Patient states his BP has been very low. He says he has also noticed his weight fluctuating. He states Monday he gained 3 lbs, Tuesday he lost 2 lbs, and today he is back down 3 lbs. Patient also states he has had chest pain ? ? ?Pt c/o of Chest Pain: STAT if CP now or developed within 24 hours ? ?1. Are you having CP right now? no ? ?2. Are you experiencing any other symptoms (ex. SOB, nausea, vomiting, sweating)? Some SOB, not now ? ?3. How long have you been experiencing CP? At least 2 weeks ? ?4. Is your CP continuous or coming and going? Comes and goes ? ?5. Have you taken Nitroglycerin? Yes, but has been taking more lately ? ? ?Patient states he has had chest pain for the last 2 weeks that comes and goes. He says he has had to take more nitroglycerin lately.  ??  ?

## 2022-01-08 NOTE — Telephone Encounter (Signed)
Scheduled 4-7 at 10. ?

## 2022-01-08 NOTE — Telephone Encounter (Signed)
Unable to reach pt or leave a message  

## 2022-01-08 NOTE — Telephone Encounter (Deleted)
Left message for pt to call.

## 2022-01-09 ENCOUNTER — Ambulatory Visit: Payer: Medicare Other | Admitting: Medical

## 2022-01-09 ENCOUNTER — Encounter: Payer: Self-pay | Admitting: Medical

## 2022-01-09 VITALS — BP 98/58 | HR 89 | Ht 67.0 in | Wt 197.0 lb

## 2022-01-09 DIAGNOSIS — R072 Precordial pain: Secondary | ICD-10-CM | POA: Diagnosis not present

## 2022-01-09 DIAGNOSIS — I255 Ischemic cardiomyopathy: Secondary | ICD-10-CM

## 2022-01-09 DIAGNOSIS — I5020 Unspecified systolic (congestive) heart failure: Secondary | ICD-10-CM

## 2022-01-09 DIAGNOSIS — R0609 Other forms of dyspnea: Secondary | ICD-10-CM | POA: Diagnosis not present

## 2022-01-09 DIAGNOSIS — E782 Mixed hyperlipidemia: Secondary | ICD-10-CM

## 2022-01-09 DIAGNOSIS — G4733 Obstructive sleep apnea (adult) (pediatric): Secondary | ICD-10-CM

## 2022-01-09 DIAGNOSIS — I951 Orthostatic hypotension: Secondary | ICD-10-CM | POA: Diagnosis not present

## 2022-01-09 MED ORDER — METOPROLOL SUCCINATE ER 25 MG PO TB24: 25.0000 mg | ORAL_TABLET | Freq: Every day | ORAL | 3 refills | Status: DC

## 2022-01-09 NOTE — H&P (View-Only) (Signed)
?Cardiology Office Note:   ? ?Date:  01/12/2022  ? ?ID:  [name], DOB 05/08/1961, MRN [id number] ? ?PCP:  [name], MD  ?[place] Cardiologist:  [name], MD  ?[facility] Electrophysiologist:  None  ? ?Referring MD: [name], MD  ? ?Chief Complaint: low BP ? ?History of Present Illness:   ? ?[name] is a 61 y.o. male with a hx of CAD status post multiple PCI's, ischemic cardiomyopathy, HFrEF, hypertension, hyperlipidemia, type 2 diabetes mellitus, diabetic neuropathy, myasthenia gravis, sepsis, and obstructive sleep apnea who is being seen for low BP. ? ?He initially suffered a myocardial infarction in 2009 while living in [place].  He has had approximately 10 stents between the LAD and right coronary artery over time.  In June 2019, he had worsening dyspnea on exertion and chest pain.  Echo showed an EF of 30 to 35%.  Cath showed a new 90% stenosis in the mid left circumflex with multiple patent LAD, RCA, and OM 2 stents.  The circumflex was successfully treated with a drug-eluting stent.  He was admitted twice in the fall 2019 with sepsis.  TEE in December 2019 showed an EF of 40 to 45% without vegetation.  In October 2021, had a syncopal episode in the setting of coughing.  He was subsequently found to be orthostatic and was encouraged to increase p.o. fluids.  ZIO monitoring in January 2022, did not show any significant arrhythmias.  Triggered events were so she was sinus tachycardia.  Most recent echocardiogram in March 2022 continue to show LV dysfunction with an EF of 35 to 40%, severe apical anterior, apical lateral, apical inferior, and apical hypokinesis.  He also has grade 1 diastolic dysfunction, and normal RV function.  Guideline directed medical therapy has historically been limited by dizziness and orthostasis.  He did not tolerate Entresto previously. ? ?Seen 12/10/21 fo orthostatic hypotension suspected secondary to dehydration. Lasix was changed to PRN.  Torpol was decreased to '50mg'$  daily.  ? ?The patient called in for low BP and was scheduled for an appointment.  ? ?Today, the patient has low BP, says he still has dizziness and lightheaded when he stands up. Deceasing Torpol did help but symptoms did not fully go away. Has always had chronic angina, but has noted more frequent episodes and  needs NTG at times.  No LLE. Has some orthopnea which seems worse. Feels he has been more congested. He eats low salt, compression socks. Has lost a lot of weight. 240>197lbs,suspected from no teeth and Ozempic. Saw oncology and pulmonology, work-up unremarkable.  ? ? ?Past Medical History:  ?Diagnosis Date  ? Allergy   ? dust, seasonal (worse in the fall).  ? Anemia   ? Arthritis   ? 2/2 Lyme Disease. Followed by Pain Specialist in [place], back and neck  ? Asthma   ? BRONCHITIS  ? Cataract   ? First Dx in 2012  ? Chronic combined systolic and diastolic congestive heart failure ([place])   ? a. 03/2018 Echo: EF 30-35%; b. 07/2018 Echo: EF 35-40%; c. 09/2019 TEE: EF 40-45%; d. 12/2020 Echo: EF 35-40%, sev apical ant, apical lat, apical inf, apical HK. GrI DD, nl RV fxn.  ? Coronary artery disease   ? a. Prior Ant MI->s/p multiple stents placed in the LAD and right coronary artery ([place]); b. 2016 Cath: reportedly nonobs dzs;  c. 04/2018 Cath/PCI: LM nl, LAD 20p, patent mid stent, LCX 22m3.25x15 Sierra DES), OM1 nl, OM2 50, OM3  40 w/ patent stent, RCA 40p, 66m 40d w/ patent stent in RPDA, RPAV 60, EF 25-35%. 2+MR; d. 02/2019 MV: Apical scar, no isch, EF 30-44%.  ? Deaf, left   ? Diabetes mellitus without complication ([place]   ? TYPE 2  ? Diabetic peripheral neuropathy ([place]   ? feet and hands  ? FUO (fever of unknown origin) 08/03/2018  ? GERD (gastroesophageal reflux disease)   ? Headache   ? muscle tension  ? Hyperlipidemia   ? Hypertension   ? CONTROLLED ON MEDS  ? Hyperthyroidism   ? Insomnia   ? Ischemic cardiomyopathy   ? a. 03/2018 Echo: EF 30-35%, ant, antlat, apical AK, Gr1 DD; b.  07/2018 Echo: EF 35-40%, anteroseptal, apical, and ant HK. Gr1 DD; c. 09/2019 TEE: EF 40-45%; d. 12/2020 Echo: EF 35-40%, sev apical ant, apical lat, apical inf, apical HK. GrI DD, nl RV fxn.  ? Knee pain, acute 05/06/2020  ? Left arm weakness 10/04/2019  ? Left leg weakness 12/01/2019  ? Lyme disease   ? Chronic  ? Myasthenia gravis ([place]   ? (03/18/21 - no current treatment - "better than it has ever been" per pt)  ? [facility]  ? Palpitations   ? a. 10/2020 Zio: RSR, 88 avg. 3 brief SVT episodes (max 5 beats @ 128). Rare PACs/PVCs. Triggered events did not correlate w/ significant arrthymia - some w/ sinus tach.  ? Seasonal allergies   ? Sepsis ([place]   ? a.07/2018 - unknown source. TEE neg for veg 09/2019.  ? Sleep apnea   ? CPAP  ? Wears hearing aid in both ears   ? ? ?Past Surgical History:  ?Procedure Laterality Date  ? BILATERAL CARPAL TUNNEL RELEASE Bilateral L in 2012 and R in 2013  ? CARDIAC CATHETERIZATION    ? Several Caths, most recent in  March 2016.  ? COLONOSCOPY WITH PROPOFOL N/A 01/10/2016  ? Procedure: COLONOSCOPY WITH PROPOFOL;  Surgeon: DLucilla Lame MD;  Location: [facility] ENDOSCOPY;  Service: Endoscopy;  Laterality: N/A;  ? COLONOSCOPY WITH PROPOFOL N/A 04/14/2021  ? Procedure: COLONOSCOPY WITH PROPOFOL;  Surgeon: WLucilla Lame MD;  [place]  Service: Endoscopy;  Laterality: N/A;  Diabetic - insulin and oral meds  ? CORONARY ANGIOPLASTY    ? CORONARY STENT INTERVENTION N/A 04/25/2018  ? Procedure: CORONARY STENT INTERVENTION;  Surgeon: AWellington Hampshire MD;  [facility];  Service: Cardiovascular;  Laterality: N/A;  ? ESOPHAGOGASTRODUODENOSCOPY (EGD) WITH PROPOFOL N/A 01/10/2016  ? Procedure: ESOPHAGOGASTRODUODENOSCOPY (EGD) WITH PROPOFOL;  Surgeon: DLucilla Lame MD;  Location: [facility] ENDOSCOPY;  Service: Endoscopy;  Laterality: N/A;  ? ESOPHAGOGASTRODUODENOSCOPY (EGD) WITH PROPOFOL N/A 04/14/2021  ? Procedure: ESOPHAGOGASTRODUODENOSCOPY (EGD) WITH  BIOPSY;  Surgeon: WLucilla Lame MD;  [place]  Service: Endoscopy;  Laterality: N/A;  ? EYE SURGERY Bilateral 2012  ? cataract/bilateral vitrectomies  ? GIVENS CAPSULE STUDY N/A 08/20/2021  ? Procedure: GIVENS CAPSULE STUDY;  Surgeon: WLucilla Lame MD;  [facility];  Service: Endoscopy;  Laterality: N/A;  ? LEFT HEART CATH AND CORONARY ANGIOGRAPHY Left 04/25/2018  ? Procedure: LEFT HEART CATH AND CORONARY ANGIOGRAPHY;  Surgeon: AWellington Hampshire MD;  [place];  Service: Cardiovascular;  Laterality: Left;  ? TEE WITHOUT CARDIOVERSION N/A 09/05/2018  ? Procedure: TRANSESOPHAGEAL ECHOCARDIOGRAM (TEE);  Surgeon: AWellington Hampshire MD;  Location: [facility] ORS;  Service: Cardiovascular;  Laterality: N/A;  ? TONSILLECTOMY AND ADENOIDECTOMY    ? As  a child  ? TUNNELED VENOUS CATHETER PLACEMENT    ? removed  ? ? ?Current Medications: ?Current Meds  ?Medication Sig  ? albuterol (PROVENTIL) (2.5 MG/3ML) 0.083% nebulizer solution Take 3 mLs (2.5 mg total) by nebulization every 6 (six) hours as needed for wheezing or shortness of breath.  ? ammonium lactate (AMLACTIN) 12 % lotion Apply 1 application topically as needed for dry skin.  ? aspirin EC 81 MG tablet Take 1 tablet (81 mg total) by mouth daily.  ? budesonide-formoterol (SYMBICORT) 160-4.5 MCG/ACT inhaler Inhale 2 puffs into the lungs 2 (two) times daily.  ? clopidogrel (PLAVIX) 75 MG tablet TAKE 1 TABLET(75 MG) BY MOUTH DAILY WITH BREAKFAST  ? cyclobenzaprine (FLEXERIL) 10 MG tablet Take 10 mg by mouth 3 (three) times daily as needed for muscle spasms.  ? dexlansoprazole (DEXILANT) 60 MG capsule Take 1 capsule (60 mg total) by mouth daily.  ? DULoxetine (CYMBALTA) 60 MG capsule Take 1 capsule (60 mg total) by mouth daily.  ? ezetimibe (ZETIA) 10 MG tablet TAKE 1 TABLET(10 MG) BY MOUTH DAILY  ? gabapentin (NEURONTIN) 300 MG capsule Take 1 capsule (300 mg total) by mouth 3 (three) times daily.  ? gabapentin (NEURONTIN)  800 MG tablet Take 800 mg by mouth at bedtime.  ? insulin lispro (HUMALOG) 100 UNIT/ML KiwkPen Inject 7-8 Units into the skin 3 (three) times daily. Reported on 12/02/2015/ sliding scale 1 unit for ever

## 2022-01-09 NOTE — Patient Instructions (Addendum)
Medication Instructions:  ?Your physician has recommended you make the following change in your medication:  ? ?DECREASE Metoprolol Succinate (Toprol XL) to 25 mg once daily ? ? ?*If you need a refill on your cardiac medications before your next appointment, please call your pharmacy* ? ? ?Lab Work: ?BNP today ? ?If you have labs (blood work) drawn today and your tests are completely normal, you will receive your results only by: ?MyChart Message (if you have MyChart) OR ?A paper copy in the mail ?If you have any lab test that is abnormal or we need to change your treatment, we will call you to review the results. ? ? ?Testing/Procedures: ?Your physician has requested that you have an echocardiogram. ? ? Echocardiography is a painless test that uses sound waves to create images of your heart. It provides your doctor with information about the size and shape of your heart and how well your heart?s chambers and valves are working. This procedure takes approximately one hour. There are no restrictions for this procedure. ? ?[place] ? ?Your caregiver has ordered a Stress Test with nuclear imaging. The purpose of this test is to evaluate the blood supply to your heart muscle. This procedure is referred to as a "Non-Invasive Stress Test." This is because other than having an IV started in your vein, nothing is inserted or "invades" your body. Cardiac stress tests are done to find areas of poor blood flow to the heart by determining the extent of coronary artery disease (CAD). Some patients exercise on a treadmill, which naturally increases the blood flow to your heart, while others who are  unable to walk on a treadmill due to physical limitations have a pharmacologic/chemical stress agent called Lexiscan . This medicine will mimic walking on a treadmill by temporarily increasing your coronary blood flow.  ? ?Please note: these test may take anywhere between 2-4 hours to complete ? ?PLEASE REPORT TO [facility] MEDICAL MALL  ENTRANCE  ?THE VOLUNTEERS AT THE FIRST DESK WILL DIRECT YOU WHERE TO GO ? ?Date of Procedure:____________________ ? ?Arrival Time for Procedure:__________________________ ? ?Instructions regarding medication:  ? ?_XX___ : Hold Metformin the night before and morning of your test. ? ?_XX___ : Hold Furosemide the morning of your test. ? ?_XX___:  ONLY Take 1/2 dose of Lantus the night before and NO novolog the morning of your test.  ? ?_XX___ : Hold other diabetes medication the morning of procedure ? ?PLEASE NOTIFY THE OFFICE AT LEAST 82 HOURS IN ADVANCE IF YOU ARE UNABLE TO KEEP YOUR APPOINTMENT.  [phone] ?AND  ?PLEASE NOTIFY NUCLEAR MEDICINE AT [facility] AT LEAST 53 HOURS IN ADVANCE IF YOU ARE UNABLE TO KEEP YOUR APPOINTMENT. [phone] ? ?How to prepare for your Myoview test: ? ?Do not eat or drink after midnight ?No caffeine for 24 hours prior to test ?No smoking 24 hours prior to test. ?Your medication may be taken with water.  If your doctor stopped a medication because of this test, do not take that medication. ?Ladies, please do not wear dresses.  Skirts or pants are appropriate. Please wear a short sleeve shirt. ?No perfume, cologne or lotion. ?Wear comfortable walking shoes. No heels! ? ? ? ?Follow-Up: ?At [facility], you and your health needs are our priority.  As part of our continuing mission to provide you with exceptional heart care, we have created designated Provider Care Teams.  These Care Teams include your primary Cardiologist (physician) and Advanced Practice Providers (APPs -  Physician Assistants and Nurse  Practitioners) who all work together to provide you with the care you need, when you need it. ? ? ?Your next appointment:   ?1 month(s) ? ?The format for your next appointment:   ?In Person ? ?Provider:   ?[name], MD or Cadence Kathlen Mody, [place] ?

## 2022-01-09 NOTE — Progress Notes (Signed)
?Cardiology Office Note:   ? ?Date:  01/12/2022  ? ?ID:  [name], DOB [birth year], MRN [id number] ? ?PCP:  [name], MD  ?[place] Cardiologist:  [name], MD  ?[facility] Electrophysiologist:  None  ? ?Referring MD: [name], MD  ? ?Chief Complaint: low BP ? ?History of Present Illness:   ? ?[name] is a 61 y.o. male with a hx of CAD status post multiple PCI's, ischemic cardiomyopathy, HFrEF, hypertension, hyperlipidemia, type 2 diabetes mellitus, diabetic neuropathy, myasthenia gravis, sepsis, and obstructive sleep apnea who is being seen for low BP. ? ?He initially suffered a myocardial infarction in 2009 while living in [place].  He has had approximately 10 stents between the LAD and right coronary artery over time.  In June 2019, he had worsening dyspnea on exertion and chest pain.  Echo showed an EF of 30 to 35%.  Cath showed a new 90% stenosis in the mid left circumflex with multiple patent LAD, RCA, and OM 2 stents.  The circumflex was successfully treated with a drug-eluting stent.  He was admitted twice in the fall 2019 with sepsis.  TEE in December 2019 showed an EF of 40 to 45% without vegetation.  In October 2021, had a syncopal episode in the setting of coughing.  He was subsequently found to be orthostatic and was encouraged to increase p.o. fluids.  ZIO monitoring in January 2022, did not show any significant arrhythmias.  Triggered events were so she was sinus tachycardia.  Most recent echocardiogram in March 2022 continue to show LV dysfunction with an EF of 35 to 40%, severe apical anterior, apical lateral, apical inferior, and apical hypokinesis.  He also has grade 1 diastolic dysfunction, and normal RV function.  Guideline directed medical therapy has historically been limited by dizziness and orthostasis.  He did not tolerate Entresto previously. ? ?Seen 12/10/21 fo orthostatic hypotension suspected secondary to dehydration. Lasix was changed to PRN.  Torpol was decreased to '50mg'$  daily.  ? ?The patient called in for low BP and was scheduled for an appointment.  ? ?Today, the patient has low BP, says he still has dizziness and lightheaded when he stands up. Deceasing Torpol did help but symptoms did not fully go away. Has always had chronic angina, but has noted more frequent episodes and  needs NTG at times.  No LLE. Has some orthopnea which seems worse. Feels he has been more congested. He eats low salt, compression socks. Has lost a lot of weight. 240>197lbs,suspected from no teeth and Ozempic. Saw oncology and pulmonology, work-up unremarkable.  ? ? ?Past Medical History:  ?Diagnosis Date  ? Allergy   ? dust, seasonal (worse in the fall).  ? Anemia   ? Arthritis   ? 2/2 Lyme Disease. Followed by Pain Specialist in [place], back and neck  ? Asthma   ? BRONCHITIS  ? Cataract   ? First Dx in 2012  ? Chronic combined systolic and diastolic congestive heart failure ([place])   ? a. 03/2018 Echo: EF 30-35%; b. 07/2018 Echo: EF 35-40%; c. 09/2019 TEE: EF 40-45%; d. 12/2020 Echo: EF 35-40%, sev apical ant, apical lat, apical inf, apical HK. GrI DD, nl RV fxn.  ? Coronary artery disease   ? a. Prior Ant MI->s/p multiple stents placed in the LAD and right coronary artery ([place]); b. 2016 Cath: reportedly nonobs dzs;  c. 04/2018 Cath/PCI: LM nl, LAD 20p, patent mid stent, LCX 57m3.25x15 Sierra DES), OM1 nl, OM2 50, OM3  40 w/ patent stent, RCA 40p, 75m 40d w/ patent stent in RPDA, RPAV 60, EF 25-35%. 2+MR; d. 02/2019 MV: Apical scar, no isch, EF 30-44%.  ? Deaf, left   ? Diabetes mellitus without complication ([place]   ? TYPE 2  ? Diabetic peripheral neuropathy ([place]   ? feet and hands  ? FUO (fever of unknown origin) 08/03/2018  ? GERD (gastroesophageal reflux disease)   ? Headache   ? muscle tension  ? Hyperlipidemia   ? Hypertension   ? CONTROLLED ON MEDS  ? Hyperthyroidism   ? Insomnia   ? Ischemic cardiomyopathy   ? a. 03/2018 Echo: EF 30-35%, ant, antlat, apical AK, Gr1 DD; b.  07/2018 Echo: EF 35-40%, anteroseptal, apical, and ant HK. Gr1 DD; c. 09/2019 TEE: EF 40-45%; d. 12/2020 Echo: EF 35-40%, sev apical ant, apical lat, apical inf, apical HK. GrI DD, nl RV fxn.  ? Knee pain, acute 05/06/2020  ? Left arm weakness 10/04/2019  ? Left leg weakness 12/01/2019  ? Lyme disease   ? Chronic  ? Myasthenia gravis ([place]   ? (03/18/21 - no current treatment - "better than it has ever been" per pt)  ? Myocardial infarction ([facility] 2010  ? Palpitations   ? a. 10/2020 Zio: RSR, 88 avg. 3 brief SVT episodes (max 5 beats @ 128). Rare PACs/PVCs. Triggered events did not correlate w/ significant arrthymia - some w/ sinus tach.  ? Seasonal allergies   ? Sepsis ([place]   ? a.07/2018 - unknown source. TEE neg for veg 09/2019.  ? Sleep apnea   ? CPAP  ? Wears hearing aid in both ears   ? ? ?Past Surgical History:  ?Procedure Laterality Date  ? BILATERAL CARPAL TUNNEL RELEASE Bilateral L in 2012 and R in 2013  ? CARDIAC CATHETERIZATION    ? Several Caths, most recent in  March 2016.  ? COLONOSCOPY WITH PROPOFOL N/A 01/10/2016  ? Procedure: COLONOSCOPY WITH PROPOFOL;  Surgeon: DLucilla Lame MD;  Location: [facility] ENDOSCOPY;  Service: Endoscopy;  Laterality: N/A;  ? COLONOSCOPY WITH PROPOFOL N/A 04/14/2021  ? Procedure: COLONOSCOPY WITH PROPOFOL;  Surgeon: WLucilla Lame MD;  [place]  Service: Endoscopy;  Laterality: N/A;  Diabetic - insulin and oral meds  ? CORONARY ANGIOPLASTY    ? CORONARY STENT INTERVENTION N/A 04/25/2018  ? Procedure: CORONARY STENT INTERVENTION;  Surgeon: AWellington Hampshire MD;  [facility];  Service: Cardiovascular;  Laterality: N/A;  ? ESOPHAGOGASTRODUODENOSCOPY (EGD) WITH PROPOFOL N/A 01/10/2016  ? Procedure: ESOPHAGOGASTRODUODENOSCOPY (EGD) WITH PROPOFOL;  Surgeon: DLucilla Lame MD;  Location: [facility] ENDOSCOPY;  Service: Endoscopy;  Laterality: N/A;  ? ESOPHAGOGASTRODUODENOSCOPY (EGD) WITH PROPOFOL N/A 04/14/2021  ? Procedure: ESOPHAGOGASTRODUODENOSCOPY (EGD) WITH  BIOPSY;  Surgeon: WLucilla Lame MD;  [place]  Service: Endoscopy;  Laterality: N/A;  ? EYE SURGERY Bilateral 2012  ? cataract/bilateral vitrectomies  ? GIVENS CAPSULE STUDY N/A 08/20/2021  ? Procedure: GIVENS CAPSULE STUDY;  Surgeon: WLucilla Lame MD;  [facility]  Service: Endoscopy;  Laterality: N/A;  ? LEFT HEART CATH AND CORONARY ANGIOGRAPHY Left 04/25/2018  ? Procedure: LEFT HEART CATH AND CORONARY ANGIOGRAPHY;  Surgeon: AWellington Hampshire MD;  [place] [facility];  Service: Cardiovascular;  Laterality: Left;  ? TEE WITHOUT CARDIOVERSION N/A 09/05/2018  ? Procedure: TRANSESOPHAGEAL ECHOCARDIOGRAM (TEE);  Surgeon: AWellington Hampshire MD;  Location: [facility] ORS;  Service: Cardiovascular;  Laterality: N/A;  ? TONSILLECTOMY AND ADENOIDECTOMY    ? As  a child  ? TUNNELED VENOUS CATHETER PLACEMENT    ? removed  ? ? ?Current Medications: ?Current Meds  ?Medication Sig  ? albuterol (PROVENTIL) (2.5 MG/3ML) 0.083% nebulizer solution Take 3 mLs (2.5 mg total) by nebulization every 6 (six) hours as needed for wheezing or shortness of breath.  ? ammonium lactate (AMLACTIN) 12 % lotion Apply 1 application topically as needed for dry skin.  ? aspirin EC 81 MG tablet Take 1 tablet (81 mg total) by mouth daily.  ? budesonide-formoterol (SYMBICORT) 160-4.5 MCG/ACT inhaler Inhale 2 puffs into the lungs 2 (two) times daily.  ? clopidogrel (PLAVIX) 75 MG tablet TAKE 1 TABLET(75 MG) BY MOUTH DAILY WITH BREAKFAST  ? cyclobenzaprine (FLEXERIL) 10 MG tablet Take 10 mg by mouth 3 (three) times daily as needed for muscle spasms.  ? dexlansoprazole (DEXILANT) 60 MG capsule Take 1 capsule (60 mg total) by mouth daily.  ? DULoxetine (CYMBALTA) 60 MG capsule Take 1 capsule (60 mg total) by mouth daily.  ? ezetimibe (ZETIA) 10 MG tablet TAKE 1 TABLET(10 MG) BY MOUTH DAILY  ? gabapentin (NEURONTIN) 300 MG capsule Take 1 capsule (300 mg total) by mouth 3 (three) times daily.  ? gabapentin (NEURONTIN)  800 MG tablet Take 800 mg by mouth at bedtime.  ? insulin lispro (HUMALOG) 100 UNIT/ML KiwkPen Inject 7-8 Units into the skin 3 (three) times daily. Reported on 12/02/2015/ sliding scale 1 unit for ever

## 2022-01-12 ENCOUNTER — Ambulatory Visit (INDEPENDENT_AMBULATORY_CARE_PROVIDER_SITE_OTHER): Payer: Medicare Other | Admitting: Internal Medicine

## 2022-01-12 ENCOUNTER — Encounter: Payer: Self-pay | Admitting: Internal Medicine

## 2022-01-12 VITALS — BP 106/62 | HR 95 | Temp 97.4°F | Resp 16 | Ht 67.0 in | Wt 199.1 lb

## 2022-01-12 DIAGNOSIS — R062 Wheezing: Secondary | ICD-10-CM

## 2022-01-12 DIAGNOSIS — F411 Generalized anxiety disorder: Secondary | ICD-10-CM | POA: Diagnosis not present

## 2022-01-12 DIAGNOSIS — F331 Major depressive disorder, recurrent, moderate: Secondary | ICD-10-CM | POA: Diagnosis not present

## 2022-01-12 LAB — BRAIN NATRIURETIC PEPTIDE: BNP: 60.6 pg/mL (ref 0.0–100.0)

## 2022-01-12 MED ORDER — DULOXETINE HCL 30 MG PO CPEP: 30.0000 mg | ORAL_CAPSULE | Freq: Every day | ORAL | 1 refills | Status: DC

## 2022-01-12 MED ORDER — METHYLPREDNISOLONE 4 MG PO TBPK
ORAL_TABLET | ORAL | 0 refills | Status: DC
Start: 2022-01-12 — End: 2022-02-11

## 2022-01-12 NOTE — Progress Notes (Signed)
? ?Acute Office Visit ? ?Subjective:  ? ? Patient ID: [name], male    DOB: [birth year], 61 y.o.   MRN: [id number] ? ?Chief Complaint  ?Patient presents with  ? Follow-up  ? Medication Refill  ? Hypertension  ? ? ?HPI ?Patient is in today for congestion and medication refills.  ? ?URI Compliant:  ?-Worst symptom: congestion 2.5 weeks  ?-Fever: no ?-Cough: yes, productive and green, worse with laying flat  ?-Shortness of breath: yes, chronic  ?-Wheezing: yes ?-Chest congestion: yes ?-Nasal congestion: no ?-Runny nose: no ?-Post nasal drip: no ?-Sneezing: no ?-Sore throat: no ?-Sinus pressure: no ?-Headache: no ?-Face pain: no ?-Ear pain: no bilateral ?-Ear pressure: yes bilateral ?-Relief with OTC cold/cough medications: no  ?-Treatments attempted: Albuterol nebulizer (doesn't feel like it helps long term), Mucinex, cold/sinus,  ? ?MDD: ?-Mood status: controlled ?-Current treatment: Cymbalta 60 in the evening and 30 in the morning  ?-Satisfied with current treatment?: yes ?-Symptom severity: moderate  ?-Duration of current treatment :  6 months ?-Side effects: no ?Medication compliance: excellent compliance ?Psychotherapy/counseling: no  has appointment scheduled on Thursday  to start therapy.  ?-Does have family history of dementia in his father, who is living up [place] and progressing in his illness  ? ?Past Medical History:  ?Diagnosis Date  ? Allergy   ? dust, seasonal (worse in the fall).  ? Anemia   ? Arthritis   ? 2/2 Lyme Disease. Followed by Pain Specialist in [place], back and neck  ? Asthma   ? BRONCHITIS  ? Cataract   ? First Dx in 2012  ? Chronic combined systolic and diastolic congestive heart failure ([place])   ? a. 03/2018 Echo: EF 30-35%; b. 07/2018 Echo: EF 35-40%; c. 09/2019 TEE: EF 40-45%; d. 12/2020 Echo: EF 35-40%, sev apical ant, apical lat, apical inf, apical HK. GrI DD, nl RV fxn.  ? Coronary artery disease   ? a. Prior Ant MI->s/p multiple stents placed in the LAD and right coronary artery  ([place]); b. 2016 Cath: reportedly nonobs dzs;  c. 04/2018 Cath/PCI: LM nl, LAD 20p, patent mid stent, LCX 4m3.25x15 Sierra DES), OM1 nl, OM2 50, OM3 40 w/ patent stent, RCA 40p, 279m40d w/ patent stent in RPDA, RPAV 60, EF 25-35%. 2+MR; d. 02/2019 MV: Apical scar, no isch, EF 30-44%.  ? Deaf, left   ? Diabetes mellitus without complication ([place]  ? TYPE 2  ? Diabetic peripheral neuropathy ([place]  ? feet and hands  ? FUO (fever of unknown origin) 08/03/2018  ? GERD (gastroesophageal reflux disease)   ? Headache   ? muscle tension  ? Hyperlipidemia   ? Hypertension   ? CONTROLLED ON MEDS  ? Hyperthyroidism   ? Insomnia   ? Ischemic cardiomyopathy   ? a. 03/2018 Echo: EF 30-35%, ant, antlat, apical AK, Gr1 DD; b. 07/2018 Echo: EF 35-40%, anteroseptal, apical, and ant HK. Gr1 DD; c. 09/2019 TEE: EF 40-45%; d. 12/2020 Echo: EF 35-40%, sev apical ant, apical lat, apical inf, apical HK. GrI DD, nl RV fxn.  ? Knee pain, acute 05/06/2020  ? Left arm weakness 10/04/2019  ? Left leg weakness 12/01/2019  ? Lyme disease   ? Chronic  ? Myasthenia gravis ([place]CA[place]  ? (03/18/21 - no current treatment - "better than it has ever been" per pt)  ? [facility]  ? Palpitations   ? a. 10/2020 Zio: RSR, 88 avg. 3 brief SVT episodes (max 5 beats @  128). Rare PACs/PVCs. Triggered events did not correlate w/ significant arrthymia - some w/ sinus tach.  ? Seasonal allergies   ? Sepsis ([place])   ? a.07/2018 - unknown source. TEE neg for veg 09/2019.  ? Sleep apnea   ? CPAP  ? Wears hearing aid in both ears   ? ? ?Past Surgical History:  ?Procedure Laterality Date  ? BILATERAL CARPAL TUNNEL RELEASE Bilateral L in 2012 and R in 2013  ? CARDIAC CATHETERIZATION    ? Several Caths, most recent in  March 2016.  ? COLONOSCOPY WITH PROPOFOL N/A 01/10/2016  ? Procedure: COLONOSCOPY WITH PROPOFOL;  Surgeon: [name], MD;  Location: [facility] ENDOSCOPY;  Service: Endoscopy;  Laterality: N/A;  ? COLONOSCOPY WITH PROPOFOL N/A 04/14/2021  ?  Procedure: COLONOSCOPY WITH PROPOFOL;  Surgeon: [name], MD;  Location: [place];  Service: Endoscopy;  Laterality: N/A;  Diabetic - insulin and oral meds  ? CORONARY ANGIOPLASTY    ? CORONARY STENT INTERVENTION N/A 04/25/2018  ? Procedure: CORONARY STENT INTERVENTION;  Surgeon: [name], MD;  Location: [place] [facility];  Service: Cardiovascular;  Laterality: N/A;  ? ESOPHAGOGASTRODUODENOSCOPY (EGD) WITH PROPOFOL N/A 01/10/2016  ? Procedure: ESOPHAGOGASTRODUODENOSCOPY (EGD) WITH PROPOFOL;  Surgeon: [name], MD;  Location: [facility] ENDOSCOPY;  Service: Endoscopy;  Laterality: N/A;  ? ESOPHAGOGASTRODUODENOSCOPY (EGD) WITH PROPOFOL N/A 04/14/2021  ? Procedure: ESOPHAGOGASTRODUODENOSCOPY (EGD) WITH BIOPSY;  Surgeon: [name], MD;  Location: [place];  Service: Endoscopy;  Laterality: N/A;  ? EYE SURGERY Bilateral 2012  ? cataract/bilateral vitrectomies  ? GIVENS CAPSULE STUDY N/A 08/20/2021  ? Procedure: GIVENS CAPSULE STUDY;  Surgeon: [name], MD;  Location: [facility] ENDOSCOPY;  Service: Endoscopy;  Laterality: N/A;  ? LEFT HEART CATH AND CORONARY ANGIOGRAPHY Left 04/25/2018  ? Procedure: LEFT HEART CATH AND CORONARY ANGIOGRAPHY;  Surgeon: [name], MD;  Location: [place] CV LAB;  Service: Cardiovascular;  Laterality: Left;  ? TEE WITHOUT CARDIOVERSION N/A 09/05/2018  ? Procedure: TRANSESOPHAGEAL ECHOCARDIOGRAM (TEE);  Surgeon: [name], MD;  Location: [facility] ORS;  Service: Cardiovascular;  Laterality: N/A;  ? TONSILLECTOMY AND ADENOIDECTOMY    ? As a child  ? TUNNELED VENOUS CATHETER PLACEMENT    ? removed  ? ? ?Family History  ?Problem Relation Age of Onset  ? Diabetes Mother   ? Heart disease Mother   ? Cancer Father   ?     Prostate CA, Anal cancer   ? Dementia Father   ? Diabetes Brother   ? Healthy Brother   ? Healthy Brother   ? ? ?Social History  ? ?Socioeconomic History  ? Marital status: Married  ?  Spouse name: Neoma Laming  ? Number of children: 0  ?  Years of education: Not on file  ? Highest education level: Master's degree (e.g., MA, MS, MEng, MEd, MSW, MBA)  ?Occupational History  ? Occupation: disabled  ?  Comment: multiple medical problems - first approved for uncontrolled DM, but now has heart disease and Myasthenia Gravis   ?Tobacco Use  ? Smoking status: Never  ? Smokeless tobacco: Never  ? Tobacco comments:  ?  smoking cessation materials not required  ?Vaping Use  ? Vaping Use: Never used  ?Substance and Sexual Activity  ? Alcohol use: Not Currently  ?  Alcohol/week: 0.0 standard drinks  ? Drug use: No  ? Sexual activity: Not Currently  ?  Partners: Female  ?Other Topics Concern  ? Not on file  ?Social History  Narrative  ? Not on file  ? ?Social Determinants of Health  ? ?Financial Resource Strain: Low Risk   ? Difficulty of Paying Living Expenses: Not very hard  ?Food Insecurity: No Food Insecurity  ? Worried About Charity fundraiser in the Last Year: Never true  ? Ran Out of Food in the Last Year: Never true  ?Transportation Needs: No Transportation Needs  ? Lack of Transportation (Medical): No  ? Lack of Transportation (Non-Medical): No  ?Physical Activity: Insufficiently Active  ? Days of Exercise per Week: 7 days  ? Minutes of Exercise per Session: 20 min  ?Stress: Stress Concern Present  ? Feeling of Stress : To some extent  ?Social Connections: Socially Integrated  ? Frequency of Communication with Friends and Family: Once a week  ? Frequency of Social Gatherings with Friends and Family: Three times a week  ? Attends Religious Services: More than 4 times per year  ? Active Member of Clubs or Organizations: Yes  ? Attends Archivist Meetings: More than 4 times per year  ? Marital Status: Married  ?Intimate Partner Violence: Not At Risk  ? Fear of Current or Ex-Partner: No  ? Emotionally Abused: No  ? Physically Abused: No  ? Sexually Abused: No  ? ? ?Outpatient Medications Prior to Visit  ?Medication Sig Dispense Refill  ? albuterol  (PROVENTIL) (2.5 MG/3ML) 0.083% nebulizer solution Take 3 mLs (2.5 mg total) by nebulization every 6 (six) hours as needed for wheezing or shortness of breath. 75 mL 2  ? ammonium lactate (AMLACTIN) 12 % lotion Ap

## 2022-01-12 NOTE — Patient Instructions (Addendum)
It was great seeing you today! ? ?Plan discussed at today's visit: ?-Continue Cymbalta 30 in the am and 60 in the pm, refills sent ?-For wheezing, take steroid pack, will make sugars increase ?-Also start Flonase for sinus congestion and allergy symptoms  ? ?Follow up in: already scheduled  ? ?Take care and let us know if you have any questions or concerns prior to your next visit. ? ?Dr. Rosana Berger ? ?

## 2022-01-13 MED ORDER — FUROSEMIDE 20 MG PO TABS: ORAL_TABLET | ORAL | 0 refills | Status: DC

## 2022-01-13 NOTE — Addendum Note (Signed)
Addended by: Lamar Laundry on: 01/13/2022 09:14 AM ? ? Modules accepted: Orders ? ?

## 2022-01-14 ENCOUNTER — Encounter: Admission: RE | Admit: 2022-01-14 | Payer: Medicare Other | Source: Ambulatory Visit

## 2022-01-14 ENCOUNTER — Other Ambulatory Visit: Payer: Self-pay | Admitting: Cardiovascular Disease

## 2022-01-25 ENCOUNTER — Other Ambulatory Visit: Payer: Self-pay | Admitting: Cardiovascular Disease

## 2022-01-26 ENCOUNTER — Other Ambulatory Visit (INDEPENDENT_AMBULATORY_CARE_PROVIDER_SITE_OTHER): Payer: Medicare Other | Admitting: Medical

## 2022-01-26 ENCOUNTER — Telehealth: Payer: Self-pay | Admitting: Medical

## 2022-01-26 ENCOUNTER — Encounter
Admission: RE | Admit: 2022-01-26 | Discharge: 2022-01-26 | Disposition: A | Discharge status: Home / Self Care | Payer: Medicare Other | Source: Ambulatory Visit | Attending: Medical | Admitting: Medical

## 2022-01-26 ENCOUNTER — Telehealth: Payer: Self-pay | Admitting: Nurse Practitioner

## 2022-01-26 DIAGNOSIS — R072 Precordial pain: Secondary | ICD-10-CM | POA: Diagnosis present

## 2022-01-26 DIAGNOSIS — R931 Abnormal findings on diagnostic imaging of heart and coronary circulation: Secondary | ICD-10-CM

## 2022-01-26 DIAGNOSIS — I25119 Atherosclerotic heart disease of native coronary artery with unspecified angina pectoris: Secondary | ICD-10-CM

## 2022-01-26 LAB — NM MYOCAR MULTI W/SPECT W/WALL MOTION / EF
Estimated workload: 1
Exercise duration (min): 0 min
Exercise duration (sec): 0 s
LV dias vol: 109 mL (ref 62–150)
LV sys vol: 38 mL
MPHR: 158 {beats}/min
Peak HR: 99 {beats}/min
Percent HR: 62 %
Rest HR: 90 {beats}/min
Rest Nuclear Isotope Dose: 9.6 mCi
SDS: 0
SRS: 19
SSS: 17
ST Depression (mm): 0 mm
Stress Nuclear Isotope Dose: 31.5 mCi
TID: 1.31

## 2022-01-26 MED ORDER — TECHNETIUM TC 99M TETROFOSMIN IV KIT
10.0000 | PACK | Freq: Once | INTRAVENOUS | Status: AC | PRN
  Administered 2022-01-26: 9.61 via INTRAVENOUS

## 2022-01-26 MED ORDER — METOPROLOL SUCCINATE ER 25 MG PO TB24: 12.5000 mg | ORAL_TABLET | Freq: Every day | ORAL | 3 refills | Status: DC

## 2022-01-26 MED ORDER — SODIUM CHLORIDE 0.9% FLUSH: 3.0000 mL | Freq: Two times a day (BID) | INTRAVENOUS | Status: AC

## 2022-01-26 MED ORDER — REGADENOSON 0.4 MG/5ML IV SOLN
0.4000 mg | Freq: Once | INTRAVENOUS | Status: AC
  Administered 2022-01-26: 0.4 mg via INTRAVENOUS

## 2022-01-26 MED ORDER — TECHNETIUM TC 99M TETROFOSMIN IV KIT
30.0000 | PACK | Freq: Once | INTRAVENOUS | Status: AC
  Administered 2022-01-26: 31.48 via INTRAVENOUS

## 2022-01-26 NOTE — Telephone Encounter (Signed)
Patient returning call.

## 2022-01-26 NOTE — Telephone Encounter (Signed)
? ?  Pt seen in nuclear medicine today.  He has recently been having issues w/ orthostasis w/ reduction of lasix to prn only (currently using QOD), and more recently, drop in toprol xl from 50 mg daily to 25 mg daily.  He continues to have orthostatic Ss @ home w/ BPs dropping into the 80's in the evening.  BP @ rest prior to stress test this AM was 107/67. I have instructed him to reduce his toprol xl to 12.5 mg daily.  We discussed that as we continue to reduce metoprolol dose, he may have some increase in frequency of palpitations.  Pt verbalized understanding.  Med list updated. ? ?Edgar Hodgkins, NP ?01/26/2022, 9:13 AM   ?

## 2022-01-26 NOTE — Telephone Encounter (Signed)
I can do on April 26 or May 1. ?

## 2022-01-26 NOTE — H&P (View-Only) (Signed)
Abnormal high risk Myoview Lexiscan with areas of ischemia. Discussed with patient, recommend cardiac cath.  ?Risks and benefits of cardiac catheterization have been discussed with the patient.  These include bleeding, infection, kidney damage, stroke, heart attack, death.  The patient understands these risks and is willing to proceed.  ?

## 2022-01-26 NOTE — Progress Notes (Signed)
Abnormal high risk Myoview Lexiscan with areas of ischemia. Discussed with patient, recommend cardiac cath.  ?Risks and benefits of cardiac catheterization have been discussed with the patient.  These include bleeding, infection, kidney damage, stroke, heart attack, death.  The patient understands these risks and is willing to proceed.  ?

## 2022-01-26 NOTE — Telephone Encounter (Signed)
Patient had an bbnormal stress test, high risk, recommending cardiac cath. I called the patient multiple times and LVM to call back. Need to discuss cardiac cath with patient, or he can come in for a visit.  ?

## 2022-01-26 NOTE — Telephone Encounter (Signed)
Patient called back and Myoview stress test was reviewed with the patient, as well as recommendation for cardiac catheterization. Cardiac catheterization was discussed over the phone.  ?Risks and benefits of cardiac catheterization have been discussed with the patient.  These include bleeding, infection, kidney damage, stroke, heart attack, death.  The patient understands these risks and is willing to proceed.  ? ?Patient was last seen 01/09/22 and needs to be set up for [facility] within 30 days with Dr. Fletcher Anon, would prefer in [place]. Please call and schedule.  ?

## 2022-01-27 ENCOUNTER — Ambulatory Visit (INDEPENDENT_AMBULATORY_CARE_PROVIDER_SITE_OTHER): Payer: Medicare Other | Admitting: Internal Medicine

## 2022-01-27 ENCOUNTER — Ambulatory Visit
Admission: RE | Admit: 2022-01-27 | Discharge: 2022-01-27 | Disposition: A | Discharge status: Home / Self Care | Payer: Medicare Other | Attending: Internal Medicine | Admitting: Internal Medicine

## 2022-01-27 ENCOUNTER — Ambulatory Visit: Admission: RE | Admit: 2022-01-27 | Payer: Medicare Other | Source: Home / Self Care | Admitting: *Deleted

## 2022-01-27 ENCOUNTER — Ambulatory Visit
Admission: RE | Admit: 2022-01-27 | Discharge: 2022-01-27 | Disposition: A | Discharge status: Home / Self Care | Payer: Medicare Other | Source: Ambulatory Visit | Attending: Internal Medicine | Admitting: Internal Medicine

## 2022-01-27 VITALS — BP 98/62 | HR 95 | Temp 98.1°F | Resp 18 | Ht 67.0 in | Wt 197.2 lb

## 2022-01-27 DIAGNOSIS — E063 Autoimmune thyroiditis: Secondary | ICD-10-CM | POA: Diagnosis not present

## 2022-01-27 DIAGNOSIS — R062 Wheezing: Secondary | ICD-10-CM

## 2022-01-27 DIAGNOSIS — M1711 Unilateral primary osteoarthritis, right knee: Secondary | ICD-10-CM | POA: Diagnosis not present

## 2022-01-27 DIAGNOSIS — Q742 Other congenital malformations of lower limb(s), including pelvic girdle: Secondary | ICD-10-CM

## 2022-01-27 DIAGNOSIS — M25561 Pain in right knee: Secondary | ICD-10-CM

## 2022-01-27 DIAGNOSIS — W19XXXD Unspecified fall, subsequent encounter: Secondary | ICD-10-CM

## 2022-01-27 DIAGNOSIS — I951 Orthostatic hypotension: Secondary | ICD-10-CM | POA: Diagnosis not present

## 2022-01-27 DIAGNOSIS — J302 Other seasonal allergic rhinitis: Secondary | ICD-10-CM

## 2022-01-27 MED ORDER — LEVOCETIRIZINE DIHYDROCHLORIDE 5 MG PO TABS: 5.0000 mg | ORAL_TABLET | Freq: Every evening | ORAL | 0 refills | Status: DC

## 2022-01-27 MED ORDER — LEVOTHYROXINE SODIUM 50 MCG PO TABS: ORAL_TABLET | ORAL | 0 refills | Status: DC

## 2022-01-27 NOTE — Telephone Encounter (Signed)
Called the patient to discuss scheduling the cardiac cath. Lmtcb. ?

## 2022-01-27 NOTE — Progress Notes (Signed)
? ?Established Patient Office Visit ? ?Subjective   ?Patient ID: [name], male    DOB: 07-22-1961  Age: 61 y.o. MRN: [id number] ? ?Chief Complaint  ?Patient presents with  ? Follow-up  ? Hypothyroidism  ? Medication Refill  ?  For thyroid and allergy  ? Depression  ? ? ?HPI ? ?[name] is a 61 year old male here for follow up on chronic medical conditions.  ? ?Diabetes, Type 2: ?-Follows with Endocrinology, Dr. Honor Junes Next appointment in May  ?-Last A1c 1/23 7.6% ?-Medications: Jardiance 10, Ozempic 1 mg, Metformin 1000 BID, Lantus 23-26 units, Humalog 8 units at meals  ?-Patient is compliant with the above medications and reports no side effects.  ?-Checking BG at home: fasting sugar this morning was 89 ?-PNA vaccine: yes ?-Denies symptoms of hypoglycemia, polyuria, polydipsia, numbness extremities, foot ulcers/trauma.  ? ?Asthma:  ?-Following with Pulmonology, Dr. Raul Del  ?-Given steroid pack at our [place] due to wheezing and URI ?-Current Treatments: Symbicort ?-Satisfied with current treatment?: yes ?-Dyspnea frequency: short of breath frequently but this is contributed to CHF ?-Wheezing frequency: Increasing since finishing steroids ?-Cough frequency: Chronic, unchanged ?-Limitation of activity: no ?-Current upper respiratory symptoms: no ? ?Hypothyroidism: ?-Medications: Levothyroxine 50 mcg every day but 100 mcg on Sundays  ?-Patient is compliant with the above medication (s) at the above dose and reports no medication side effects.  ?-Denies weight changes, cold./heat intolerance, skin changes, anxiety/palpitations  ?-Last TSH: 12/22 2.18 ? ?Chronic Pain: ?-Following with pain management, Dr. [name] ?-Usually undergoes epidural injections and is on Oxycodone  ? ?IDA/Weight Loss: ?-Following with GI, has a history of gastritis; currently on Dexilant ?-Had capsule endoscopy but this test was inconclusive and has to be repeated  ? ?Myasthenia Gravis: ?-Had been following with Neurology, Dr. Westley Foots   ?-Not currently on medications, had been on Mestinon  ? ?Atherosclerosis of Aorta/CHF/OSA: ?-Follows with Cardiology.  Notes reviewed from 01/09/2022 as well as nuclear results from 01/26/2022. ?-Currently on Metoprolol 12.5 mg which was decreased yesterday.  Lasix 20 mg changed to as needed basis.  Patient is currently euvolemic.  ?-Recently had to call EMS due to low blood pressure; had a Lexiscan yesterday that was abnormal, planning for cardiac cath in the upcoming weeks.  He states that he lost consciousness and fell hard on his right knee.  Since then he has been having some pain around his right fibular head and patella.  He is able to bear weight.  He does use a cane for ambulatory assistance. ?-s/p 11 cardiac stents in place, currently on Plavix.  Denies abnormal bleeding ?-Is compliant with CPAP ? ?MDD: ?-Mood status: controlled ?-Current treatment: Cymbalta 30 mg in the am and 60 mg in the pm ?-Satisfied with current treatment?: yes ?-Symptom severity: severe  ?-Duration of current treatment : chronic ?-Side effects: no ?Medication compliance: excellent compliance ?Depression symptoms slightly worse today, as patient is upset with Lexi scan results. ? ?  01/27/2022  ?  9:39 AM 01/12/2022  ?  2:58 PM 12/02/2021  ?  1:57 PM 11/10/2021  ? 12:50 PM 10/31/2021  ?  3:38 PM  ?Depression screen PHQ 2/9  ?Decreased Interest 0 0 0 0 0  ?Down, Depressed, Hopeless 1 0 0 0 0  ?PHQ - 2 Score 1 0 0 0 0  ?Altered sleeping 0 0 0    ?Tired, decreased energy 1 0 0    ?Change in appetite 0 0 0    ?Feeling bad or failure  about yourself  0 0 0    ?Trouble concentrating 0 0 0    ?Moving slowly or fidgety/restless 0 0 0    ?Suicidal thoughts 0 0 0    ?PHQ-9 Score 2 0 0    ?Difficult doing work/chores Not difficult at all Not difficult at all Not difficult at all    ? ? ?Review of Systems  ?Constitutional:  Negative for chills, fever and weight loss.  ?Respiratory:  Positive for shortness of breath and wheezing.   ?Cardiovascular:   Negative for chest pain, palpitations and leg swelling.  ?Gastrointestinal:  Negative for blood in stool and melena.  ?Neurological:  Positive for loss of consciousness.  ?Psychiatric/Behavioral:  Positive for depression.   ? ?  ?Objective:  ?  ? ?BP 98/62   Pulse 95   Temp 98.1 ?F (36.7 ?C)   Resp 18   Ht _0  (1.702 m)   Wt 197 lb 3.2 oz (89.4 kg)   SpO2 96%   BMI 30.89 kg/m?  ?BP Readings from Last 3 Encounters:  ?01/27/22 98/62  ?01/12/22 106/62  ?01/09/22 (!) 98/58  ? ?Wt Readings from Last 3 Encounters:  ?01/27/22 197 lb 3.2 oz (89.4 kg)  ?01/12/22 199 lb 1.6 oz (90.3 kg)  ?01/09/22 197 lb (89.4 kg)  ? ?  ? ?Physical Exam ?Constitutional:   ?   Appearance: Normal appearance.  ?   Comments: Weight stable  ?HENT:  ?   Head: Normocephalic and atraumatic.  ?   Mouth/Throat:  ?   Mouth: Mucous membranes are moist.  ?   Pharynx: Oropharynx is clear.  ?Eyes:  ?   Conjunctiva/sclera: Conjunctivae normal.  ?Cardiovascular:  ?   Rate and Rhythm: Normal rate and regular rhythm.  ?Pulmonary:  ?   Effort: Pulmonary effort is normal.  ?   Breath sounds: Wheezing present.  ?   Comments: Wheezing improved since last office visit, more so on right side with inspiration ?Musculoskeletal:     ?   General: Tenderness present. No swelling. Normal range of motion.  ?   Right lower leg: No edema.  ?   Left lower leg: No edema.  ?   Comments: Tenderness to palpation over right patella and right fibular head  ?Skin: ?   General: Skin is warm and dry.  ?Neurological:  ?   General: No focal deficit present.  ?   Mental Status: He is alert. Mental status is at baseline.  ?Psychiatric:     ?   Mood and Affect: Mood normal.     ?   Behavior: Behavior normal.  ? ? ? ?No results found for any visits on 01/27/22. ? ?Last CBC ?Lab Results  ?Component Value Date  ? WBC 10.8 (H) 12/22/2021  ? HGB 14.2 12/22/2021  ? HCT 44.2 12/22/2021  ? MCV 91.3 12/22/2021  ? MCH 29.3 12/22/2021  ? RDW 15.7 (H) 12/22/2021  ? PLT 262 12/22/2021  ? ?Last  metabolic panel ?Lab Results  ?Component Value Date  ? GLUCOSE 253 (H) 12/10/2021  ? NA 141 12/10/2021  ? K 4.9 12/10/2021  ? CL 101 12/10/2021  ? CO2 22 12/10/2021  ? BUN 22 12/10/2021  ? CREATININE 1.25 12/10/2021  ? EGFR 65 12/10/2021  ? CALCIUM 9.3 12/10/2021  ? PROT 6.1 09/23/2021  ? ALBUMIN 4.2 07/17/2021  ? LABGLOB 2.0 07/17/2021  ? AGRATIO 2.1 07/17/2021  ? BILITOT 0.4 09/23/2021  ? ALKPHOS 99 07/17/2021  ? AST 18 09/23/2021  ?  ALT 21 09/23/2021  ? ANIONGAP 8 12/25/2020  ? ?Last lipids ?Lab Results  ?Component Value Date  ? CHOL 92 (L) 07/17/2021  ? HDL 28 (L) 07/17/2021  ? [place] 39 07/17/2021  ? LDLDIRECT 36 07/17/2021  ? TRIG 145 07/17/2021  ? CHOLHDL 3.3 07/17/2021  ? ?Last hemoglobin A1c ?Lab Results  ?Component Value Date  ? HGBA1C 7.6 (A) 10/21/2021  ? ?Last thyroid functions ?Lab Results  ?Component Value Date  ? TSH 2.18 09/23/2021  ? ?Last vitamin D ?Lab Results  ?Component Value Date  ? VD25OH 72 10/16/2019  ? ?Last vitamin B12 and Folate ?Lab Results  ?Component Value Date  ? KMKTLZBC81 469 04/02/2021  ? FOLATE 14.4 04/02/2021  ? ?  ? ?The ASCVD Risk score (Arnett DK, et al., 2019) failed to calculate for the following reasons: ?  The valid total cholesterol range is 130 to 320 mg/dL ? ?  ?Assessment & Plan:  ? ?1. Fall, subsequent encounter/Patellar pain, right/Fibular abnormality/Orthostatic hypotension: Blood pressure definitely on the lower side today, had LOC which resulted in a fall last week. He fell hard down on his right knee and is now having pain across the right patella and is having pain around the right fibular head as well.  We will obtain x-rays to rule out pathologic fracture today.  He had follow-up with his cardiologist, who decreased his metoprolol to 12.5 mg daily and Lasix 20 mg only on an as-needed basis.  He did have a nuclear cardiac study on 01/26/2022.  This study was abnormal and indicated that he is high risk.  A cardiac catheterization is currently being planned for  further evaluation.  Discussed how this could potentially be affecting blood pressure.  Continue to monitor closely at home, reviewed when to present to the emergency department if blood pressure is low. ? ?- DG

## 2022-01-27 NOTE — Telephone Encounter (Signed)
?  [facility] [facility] ?[facility] ?[address], [address] ?[place] [address] ?Dept: [phone] ?Loc: [phone] ? ?[name]  01/27/2022 ? ?You are scheduled for a Cardiac Catheterization on Monday, May 1 with Dr. [name]. ? ?1. Please arrive at the [facility] [place], [place] [address] at 8:30 AM (This time is one hour before your procedure to ensure your preparation). Free valet parking service is available.  ? ?Special note: Every effort is made to have your procedure done on time. Please understand that emergencies sometimes delay scheduled procedures. ? ?2. Diet: Do not eat solid foods after midnight.  You may have clear liquids until 5 AM upon the day of the procedure. ? ?3. Labs: You will need to have blood drawn on Wednesday, April 26 at [facility], Go to 1st desk on your right to register.  ?Address: [address] [place], [place] [address]  ?Open: 8am - 5pm  Phone: [phone]. You do not need to be fasting. ? ?4. Medication instructions in preparation for your procedure: ? ? Contrast Allergy: No ? ? ? ? ?Do not take lasix the morning of the procedure ? ?Take only 1/2 of the prescribed units of insulin the night before your procedure. Do not take any insulin on the day of the procedure. ? ?Do not take Diabetes Med Glucophage (Metformin) on the day of the procedure and HOLD 48 HOURS AFTER THE PROCEDURE. ? ?Do not take Jardiance the morning of the procedure ? ?On the morning of your procedure, take Aspirin and any morning medicines NOT listed above.  You may use sips of water. ? ?5. Plan to go home the same day, you will only stay overnight if medically necessary. ?6. You MUST have a responsible adult to drive you home. ?7. An adult MUST be with you the first 24 hours after you arrive home. ?8. Bring a current list of your medications, and the last time and date medication taken. ?9.  Bring ID and current insurance cards. ?10.Please wear clothes that are easy to get on and off and wear slip-on shoes. ? ?Thank you for allowing [place] to care for you! ?  -- [facility] ? ?

## 2022-01-27 NOTE — Telephone Encounter (Signed)
Patients cardiac cath scheduled on 02/02/22 @ 9:30 am with Dr. Fletcher Anon at [facility]. ? ? ?

## 2022-01-27 NOTE — Addendum Note (Signed)
Addended by: Lamar Laundry on: 01/27/2022 11:11 AM ? ? Modules accepted: Orders ? ?

## 2022-01-27 NOTE — Patient Instructions (Addendum)
It was great seeing you today! ? ?Plan discussed at today's visit: ?-Thyroid medication and Xyzal refilled today, no changes to any other medications ?-X-ray of right knee and shin today ?-Follow up with Pulmonologist and Cardiology for cath ? ?Follow up in: 2 months  ? ?Take care and let us know if you have any questions or concerns prior to your next visit. ? ?Dr. Rosana Berger ? ?

## 2022-01-27 NOTE — Telephone Encounter (Signed)
Patient made aware of cardiac cath date, time, location. ?Patient given verbal pre-procedure instructions with verbalized understanding. ?Written instructions sent to the patient through mychart. ?

## 2022-01-28 ENCOUNTER — Ambulatory Visit (INDEPENDENT_AMBULATORY_CARE_PROVIDER_SITE_OTHER): Payer: Medicare Other

## 2022-01-28 ENCOUNTER — Other Ambulatory Visit
Admission: RE | Admit: 2022-01-28 | Discharge: 2022-01-28 | Disposition: A | Discharge status: Home / Self Care | Payer: Medicare Other | Attending: Medical | Admitting: Medical

## 2022-01-28 ENCOUNTER — Telehealth: Payer: Self-pay | Admitting: Internal Medicine

## 2022-01-28 DIAGNOSIS — R931 Abnormal findings on diagnostic imaging of heart and coronary circulation: Secondary | ICD-10-CM | POA: Diagnosis present

## 2022-01-28 DIAGNOSIS — I5020 Unspecified systolic (congestive) heart failure: Secondary | ICD-10-CM | POA: Diagnosis not present

## 2022-01-28 DIAGNOSIS — R0609 Other forms of dyspnea: Secondary | ICD-10-CM | POA: Diagnosis not present

## 2022-01-28 DIAGNOSIS — I25119 Atherosclerotic heart disease of native coronary artery with unspecified angina pectoris: Secondary | ICD-10-CM | POA: Diagnosis present

## 2022-01-28 DIAGNOSIS — R072 Precordial pain: Secondary | ICD-10-CM

## 2022-01-28 DIAGNOSIS — E063 Autoimmune thyroiditis: Secondary | ICD-10-CM

## 2022-01-28 LAB — CBC WITH DIFFERENTIAL/PLATELET
Abs Immature Granulocytes: 0.03 10*3/uL (ref 0.00–0.07)
Basophils Absolute: 0 10*3/uL (ref 0.0–0.1)
Basophils Relative: 0 %
Eosinophils Absolute: 0.3 10*3/uL (ref 0.0–0.5)
Eosinophils Relative: 5 %
HCT: 42.9 % (ref 39.0–52.0)
Hemoglobin: 13.5 g/dL (ref 13.0–17.0)
Immature Granulocytes: 1 %
Lymphocytes Relative: 17 %
Lymphs Abs: 1.1 10*3/uL (ref 0.7–4.0)
MCH: 29.3 pg (ref 26.0–34.0)
MCHC: 31.5 g/dL (ref 30.0–36.0)
MCV: 93.3 fL (ref 80.0–100.0)
Monocytes Absolute: 0.5 10*3/uL (ref 0.1–1.0)
Monocytes Relative: 9 %
Neutro Abs: 4.2 10*3/uL (ref 1.7–7.7)
Neutrophils Relative %: 68 %
Platelets: 198 10*3/uL (ref 150–400)
RBC: 4.6 MIL/uL (ref 4.22–5.81)
RDW: 13.8 % (ref 11.5–15.5)
WBC: 6.2 10*3/uL (ref 4.0–10.5)
nRBC: 0 % (ref 0.0–0.2)

## 2022-01-28 LAB — BASIC METABOLIC PANEL
Anion gap: 7 (ref 5–15)
BUN: 17 mg/dL (ref 8–23)
CO2: 29 mmol/L (ref 22–32)
Calcium: 9.4 mg/dL (ref 8.9–10.3)
Chloride: 101 mmol/L (ref 98–111)
Creatinine, Ser: 1.09 mg/dL (ref 0.61–1.24)
GFR, Estimated: >60 mL/min (ref >60)
Glucose, Bld: 167 mg/dL — ABNORMAL HIGH (ref 70–99)
Potassium: 4.7 mmol/L (ref 3.5–5.1)
Sodium: 137 mmol/L (ref 135–145)

## 2022-01-28 LAB — ECHOCARDIOGRAM COMPLETE
AR max vel: 4.72 cm2
AV Area VTI: 4.71 cm2
AV Area mean vel: 4.57 cm2
AV Mean grad: 2 mmHg
AV Peak grad: 3 mmHg
Ao pk vel: 0.86 m/s

## 2022-01-28 MED ORDER — PERFLUTREN LIPID MICROSPHERE
1.0000 mL | INTRAVENOUS | Status: AC | PRN
  Administered 2022-01-28: 2 mL via INTRAVENOUS

## 2022-01-29 ENCOUNTER — Other Ambulatory Visit: Payer: Self-pay

## 2022-01-29 ENCOUNTER — Other Ambulatory Visit (HOSPITAL_BASED_OUTPATIENT_CLINIC_OR_DEPARTMENT_OTHER): Payer: Self-pay | Admitting: Family

## 2022-01-29 DIAGNOSIS — E063 Autoimmune thyroiditis: Secondary | ICD-10-CM
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
## 2022-01-30 ENCOUNTER — Telehealth (HOSPITAL_BASED_OUTPATIENT_CLINIC_OR_DEPARTMENT_OTHER): Payer: Self-pay

## 2022-01-30 NOTE — Telephone Encounter (Addendum)
Called results to patient and left results on VM (ok per DPR), instructions left to call office back if patient has any questions!  ? ? ? ?----- Message from Loel Dubonnet, NP sent at 01/29/2022  7:36 AM EDT ----- ?Stable kidney function. Normal electrolytes. Good result! Proceed with cardiac cath as scheduled. ?

## 2022-02-02 ENCOUNTER — Encounter
Admission: RE | Disposition: A | Discharge status: Home / Self Care | Payer: Medicare Other | Source: Home / Self Care | Attending: Cardiovascular Disease

## 2022-02-02 ENCOUNTER — Ambulatory Visit
Admission: RE | Admit: 2022-02-02 | Discharge: 2022-02-02 | Disposition: A | Discharge status: Home / Self Care | Payer: Medicare Other | Attending: Cardiovascular Disease | Admitting: Cardiovascular Disease

## 2022-02-02 ENCOUNTER — Other Ambulatory Visit: Payer: Self-pay

## 2022-02-02 ENCOUNTER — Encounter: Payer: Self-pay | Admitting: Cardiovascular Disease

## 2022-02-02 DIAGNOSIS — R0609 Other forms of dyspnea: Secondary | ICD-10-CM | POA: Diagnosis not present

## 2022-02-02 DIAGNOSIS — Z7984 Long term (current) use of oral hypoglycemic drugs: Secondary | ICD-10-CM | POA: Insufficient documentation

## 2022-02-02 DIAGNOSIS — I25118 Atherosclerotic heart disease of native coronary artery with other forms of angina pectoris: Secondary | ICD-10-CM | POA: Insufficient documentation

## 2022-02-02 DIAGNOSIS — I110 Hypertensive heart disease with heart failure: Secondary | ICD-10-CM | POA: Insufficient documentation

## 2022-02-02 DIAGNOSIS — I255 Ischemic cardiomyopathy: Secondary | ICD-10-CM | POA: Insufficient documentation

## 2022-02-02 DIAGNOSIS — G4733 Obstructive sleep apnea (adult) (pediatric): Secondary | ICD-10-CM | POA: Diagnosis not present

## 2022-02-02 DIAGNOSIS — G7000 Myasthenia gravis without (acute) exacerbation: Secondary | ICD-10-CM | POA: Insufficient documentation

## 2022-02-02 DIAGNOSIS — I252 Old myocardial infarction: Secondary | ICD-10-CM | POA: Diagnosis not present

## 2022-02-02 DIAGNOSIS — Z794 Long term (current) use of insulin: Secondary | ICD-10-CM | POA: Diagnosis not present

## 2022-02-02 DIAGNOSIS — E782 Mixed hyperlipidemia: Secondary | ICD-10-CM | POA: Insufficient documentation

## 2022-02-02 DIAGNOSIS — I208 Other forms of angina pectoris: Secondary | ICD-10-CM

## 2022-02-02 DIAGNOSIS — I5022 Chronic systolic (congestive) heart failure: Secondary | ICD-10-CM | POA: Diagnosis not present

## 2022-02-02 DIAGNOSIS — R931 Abnormal findings on diagnostic imaging of heart and coronary circulation: Secondary | ICD-10-CM

## 2022-02-02 DIAGNOSIS — I951 Orthostatic hypotension: Secondary | ICD-10-CM | POA: Insufficient documentation

## 2022-02-02 DIAGNOSIS — E1140 Type 2 diabetes mellitus with diabetic neuropathy, unspecified: Secondary | ICD-10-CM | POA: Insufficient documentation

## 2022-02-02 DIAGNOSIS — I2089 Other forms of angina pectoris: Secondary | ICD-10-CM

## 2022-02-02 DIAGNOSIS — Z955 Presence of coronary angioplasty implant and graft: Secondary | ICD-10-CM | POA: Insufficient documentation

## 2022-02-02 HISTORY — PX: LEFT HEART CATH AND CORONARY ANGIOGRAPHY: CATH118249

## 2022-02-02 LAB — GLUCOSE, CAPILLARY: Glucose-Capillary: 149 mg/dL — ABNORMAL HIGH (ref 70–99)

## 2022-02-02 SURGERY — LEFT HEART CATH AND CORONARY ANGIOGRAPHY
Anesthesia: Moderate Sedation | Laterality: Left

## 2022-02-02 MED ORDER — ACETAMINOPHEN 325 MG PO TABS: 650.0000 mg | ORAL_TABLET | ORAL | Status: DC | PRN

## 2022-02-02 MED ORDER — SODIUM CHLORIDE 0.9 % IV SOLN: INTRAVENOUS | Status: DC

## 2022-02-02 MED ORDER — SODIUM CHLORIDE 0.9 % IV SOLN: 250.0000 mL | INTRAVENOUS | Status: DC | PRN

## 2022-02-02 MED ORDER — IOHEXOL 300 MG/ML  SOLN
INTRAMUSCULAR | Status: DC | PRN
  Administered 2022-02-02: 45 mL

## 2022-02-02 MED ORDER — HEPARIN (PORCINE) IN NACL 1000-0.9 UT/500ML-% IV SOLN
INTRAVENOUS | Status: DC | PRN
  Administered 2022-02-02: 1000 mL

## 2022-02-02 MED ORDER — SODIUM CHLORIDE 0.9% FLUSH: 3.0000 mL | INTRAVENOUS | Status: DC | PRN

## 2022-02-02 MED ORDER — VERAPAMIL HCL 2.5 MG/ML IV SOLN
INTRAVENOUS | Status: AC
  Filled 2022-02-02: qty 2

## 2022-02-02 MED ORDER — SODIUM CHLORIDE 0.9% FLUSH: 3.0000 mL | Freq: Two times a day (BID) | INTRAVENOUS | Status: DC

## 2022-02-02 MED ORDER — FENTANYL CITRATE (PF) 100 MCG/2ML IJ SOLN
INTRAMUSCULAR | Status: AC
  Filled 2022-02-02: qty 2

## 2022-02-02 MED ORDER — MIDAZOLAM HCL 2 MG/2ML IJ SOLN
INTRAMUSCULAR | Status: AC
Start: 2022-02-02 — End: ?
  Filled 2022-02-02: qty 2

## 2022-02-02 MED ORDER — SODIUM CHLORIDE 0.9 % WEIGHT BASED INFUSION
3.0000 mL/kg/h | INTRAVENOUS | Status: DC
  Administered 2022-02-02: 3 mL/kg/h via INTRAVENOUS

## 2022-02-02 MED ORDER — HEPARIN SODIUM (PORCINE) 1000 UNIT/ML IJ SOLN
INTRAMUSCULAR | Status: DC | PRN
  Administered 2022-02-02: 4500 [IU] via INTRAVENOUS

## 2022-02-02 MED ORDER — VERAPAMIL HCL 2.5 MG/ML IV SOLN
INTRAVENOUS | Status: DC | PRN
  Administered 2022-02-02: 2.5 mg via INTRA_ARTERIAL

## 2022-02-02 MED ORDER — HEPARIN SODIUM (PORCINE) 1000 UNIT/ML IJ SOLN
INTRAMUSCULAR | Status: AC
  Filled 2022-02-02: qty 10

## 2022-02-02 MED ORDER — ALBUTEROL SULFATE (2.5 MG/3ML) 0.083% IN NEBU: 3.0000 mL | INHALATION_SOLUTION | Freq: Once | RESPIRATORY_TRACT | Status: AC

## 2022-02-02 MED ORDER — ALBUTEROL SULFATE (2.5 MG/3ML) 0.083% IN NEBU
INHALATION_SOLUTION | RESPIRATORY_TRACT | Status: AC
  Administered 2022-02-02: 3 mL via RESPIRATORY_TRACT
  Filled 2022-02-02: qty 3

## 2022-02-02 MED ORDER — LIDOCAINE HCL 1 % IJ SOLN
INTRAMUSCULAR | Status: AC
  Filled 2022-02-02: qty 20

## 2022-02-02 MED ORDER — FENTANYL CITRATE (PF) 100 MCG/2ML IJ SOLN
INTRAMUSCULAR | Status: DC | PRN
Start: 2022-02-02 — End: 2022-02-02
  Administered 2022-02-02: 25 ug via INTRAVENOUS

## 2022-02-02 MED ORDER — MIDAZOLAM HCL 2 MG/2ML IJ SOLN
INTRAMUSCULAR | Status: DC | PRN
  Administered 2022-02-02: 1 mg via INTRAVENOUS

## 2022-02-02 MED ORDER — HEPARIN (PORCINE) IN NACL 1000-0.9 UT/500ML-% IV SOLN
INTRAVENOUS | Status: AC
  Filled 2022-02-02: qty 1000

## 2022-02-02 MED ORDER — ASPIRIN 81 MG PO CHEW: 81.0000 mg | CHEWABLE_TABLET | ORAL | Status: DC

## 2022-02-02 MED ORDER — ONDANSETRON HCL 4 MG/2ML IJ SOLN: 4.0000 mg | Freq: Four times a day (QID) | INTRAMUSCULAR | Status: DC | PRN

## 2022-02-02 MED ORDER — LIDOCAINE HCL (PF) 1 % IJ SOLN
INTRAMUSCULAR | Status: DC | PRN
  Administered 2022-02-02: 2 mL

## 2022-02-02 MED ORDER — SODIUM CHLORIDE 0.9 % WEIGHT BASED INFUSION: 1.0000 mL/kg/h | INTRAVENOUS | Status: DC

## 2022-02-02 SURGICAL SUPPLY — 10 items
CATH 5F 110X4 TIG (CATHETERS) ×1 IMPLANT
DEVICE RAD TR BAND REGULAR (VASCULAR PRODUCTS) ×1 IMPLANT
DRAPE BRACHIAL (DRAPES) ×1 IMPLANT
GLIDESHEATH SLEND SS 6F .021 (SHEATH) ×1 IMPLANT
GUIDEWIRE INQWIRE 1.5J.035X260 (WIRE) IMPLANT
INQWIRE 1.5J .035X260CM (WIRE) ×2
PACK CARDIAC CATH (CUSTOM PROCEDURE TRAY) ×2 IMPLANT
PROTECTION STATION PRESSURIZED (MISCELLANEOUS) ×2
SET ATX SIMPLICITY (MISCELLANEOUS) ×1 IMPLANT
STATION PROTECTION PRESSURIZED (MISCELLANEOUS) IMPLANT

## 2022-02-02 NOTE — Interval H&P Note (Signed)
Cath Lab Visit (complete for each Cath Lab visit) ? ?Clinical Evaluation Leading to the Procedure:  ? ?ACS: No. ? ?Non-ACS:   ? ?Anginal Classification: CCS III ? ?Anti-ischemic medical therapy: Minimal Therapy (1 class of medications) ? ?Non-Invasive Test Results: High-risk stress test findings: cardiac mortality >3%/year ? ?Prior CABG: No previous CABG ? ? ? ? ? ?History and Physical Interval Note: ? ?02/02/2022 ?10:06 AM ? ?[name]  has presented today for surgery, with the diagnosis of LT Cath w possible PCI    Abnormal cardiac CT.  The various methods of treatment have been discussed with the patient and family. After consideration of risks, benefits and other options for treatment, the patient has consented to  Procedure(s): ?LEFT HEART CATH AND CORONARY ANGIOGRAPHY (Left) as a surgical intervention.  The patient's history has been reviewed, patient examined, no change in status, stable for surgery.  I have reviewed the patient's chart and labs.  Questions were answered to the patient's satisfaction.   ? ? ?[name] ? ? ?

## 2022-02-02 NOTE — OR Nursing (Signed)
Libra glucose monitor reading 119 ?

## 2022-02-10 NOTE — Progress Notes (Signed)
? ? ?Office Visit  ?  ?Patient Name: [name] ?Date of Encounter: 02/11/2022 ? ?Primary Care Provider:  [name], MD ?Primary Cardiologist:  [name], MD ?Primary Electrophysiologist: None ? ?Patient Profile  ?  ?Chief Complaint: Post LHC follow up ? ?Past Medical History:  ?Diagnosis Date  ? Allergy   ? dust, seasonal (worse in the fall).  ? Anemia   ? Arthritis   ? 2/2 Lyme Disease. Followed by Pain Specialist in [place], back and neck  ? Asthma   ? BRONCHITIS  ? Cataract   ? First Dx in 2012  ? Chronic combined systolic and diastolic congestive heart failure ([place])   ? a. 03/2018 Echo: EF 30-35%; b. 07/2018 Echo: EF 35-40%; c. 09/2019 TEE: EF 40-45%; d. 12/2020 Echo: EF 35-40%, sev apical ant, apical lat, apical inf, apical HK. GrI DD, nl RV fxn.e.01/2022  Echo: EF 45-40%  ? Coronary artery disease   ? a. Prior Ant MI->s/p mult stents->LAD/RCA ([place]); b. 2016 Cath: nonobs dzs;  c. 04/2018 Cath/PCI: LCX 11m(3.25x15 SAustinDES); d. 02/2022 MV: high risk; e. 02/2022 Cath: no significant changes compared to 2019 with exception of progressive stenosis of AV groove  ? Deaf, left   ? Diabetes mellitus without complication ([place]   ? TYPE 2  ? Diabetic peripheral neuropathy ([place]   ? feet and hands  ? FUO (fever of unknown origin) 08/03/2018  ? GERD (gastroesophageal reflux disease)   ? Headache   ? muscle tension  ? Hyperlipidemia   ? Hypertension   ? CONTROLLED ON MEDS  ? Hyperthyroidism   ? Insomnia   ? Ischemic cardiomyopathy   ? a. 03/2018 Echo: EF 30-35%, ant, antlat, apical AK, Gr1 DD; b. 07/2018 Echo: EF 35-40%, anteroseptal, apical, and ant HK. Gr1 DD; c. 09/2019 TEE: EF 40-45%; d. 12/2020 Echo: EF 35-40%, sev apical ant, apical lat, apical inf, apical HK. GrI DD, nl RV fxn.  ? Knee pain, acute 05/06/2020  ? Left arm weakness 10/04/2019  ? Left leg weakness 12/01/2019  ? Lyme disease   ? Chronic  ? Myasthenia gravis ([place]   ? (03/18/21 - no current treatment - "better than it has ever been" per pt)  ?  Myocardial infarction ([facility] 2010  ? Palpitations   ? a. 10/2020 Zio: RSR, 88 avg. 3 brief SVT episodes (max 5 beats @ 128). Rare PACs/PVCs. Triggered events did not correlate w/ significant arrthymia - some w/ sinus tach.  ? Seasonal allergies   ? Sepsis ([place]   ? a.07/2018 - unknown source. TEE neg for veg 09/2019.  ? Sleep apnea   ? CPAP  ? Wears hearing aid in both ears   ?  ? Recent Studies: ?2D echo 01/08/22: EF 45-50%, mildly decreased LV function, with severe hypokinesis of entire apical segment aortic dilation 39 mm ?LM[place]hallNuclear Study: consistent with prior myocardial infarction with peri-infarct ischemia. The study is high risk. ?LHC 02/02/22: Patent LAD, left circumflex, and RCA stent with no obstructive stenosis.  No changes compared to most recent cardiac cath 2019 with exception of progression of stenosis and right posterior AV groove ? ?History of Present Illness  ?  ?Edgar DUPREEis a 61y.o. male with PMH as noted above.  Patient reported increased worsening dyspnea on exertion and chest pain and 03/2018.  Left heart cath performed revealing 90% stenosis to mid left circumflex that was treated with DES x1.  Patient was readmitted subsequently in fall 2019 for sepsis.  TEE performed 09/2018 with EF of 40-45% without vegetation.  In 07/2020 patient experienced syncopal episode while coughing which was found to be orthostatic in nature and encouraged to increase p.o. fluids.  10/2020 ZIO monitor revealed no significant arrhythmias.  2D echo completed 01/2022 with EF of 35-40%, severe apical anterior, apical lateral, apical inferior and apical hypokinesis, grade 1 DD, normal RV function.  GDMT therapy was limited by dizziness and orthostasis, he did not tolerate Entresto previously. [name] was seen 12/10/2021 for complaint of orthostatic hypotension suspected secondary to dehydration.  His Toprol was decreased to 50 mg daily and Lasix was changed to as needed. ? ?[name] was last seen by Tarri Glenn, PA on 01/09/2022 for complaint of low blood pressure with dizziness and lightheadedness upon standing.  Patient also noted increased anginal symptoms that are chronic however required the need for nitroglycerin.  He endorsed some orthopnea and denies lower extremity edema, and follows a low-sodium diet.  Patient is currently on Ozempic and has lost 43 pounds that he attributes to medication and lack of teeth.  His Toprol was decreased to 25 mg daily and Lexiscan Myoview was ordered to evaluate for ischemic changes.  He was restarted on Lasix 20 mg daily.  Patient contacted office again on 01/26/22 with complaints of orthostasis and dizziness upon standing, he is Toprol was decreased to 12.5 mg daily.  Results of Myoview indicated high risk recommending cardiac cath.  Cath performed on 02/02/22 revealing no significant changes compared to recent cath in 2019 with exception of progressive stenosis and right posterior AV groove.  Aggressive medical therapy was recommended following cardiac cath. ? ?Since last being seen in the office patient reports that he has no residual chest pain but is experiencing low BP and orthostatic symptoms.He has positive orthostatics today. He was seen on 4/7 by Cadence Furth, PA with similar complaint and she reduced his metoprolol 25 mg QD. He continued to experience dizziness and contacted Ignacia Bayley, NP who recommended reducing to 12.5 mg daily. He completed his LHC on 5/1 and tolerated procedure well. On 5/7 he experienced increased dizziness and fell with out losing consciousness. He stopped the Metoprolol all together and has not experienced any further falls. Patient denies chest pain, palpitations, dyspnea, PND, orthopnea, nausea, vomiting, dizziness, syncope, edema, weight gain, or early satiety. ? ?Past Medical History  ?  ?Past Medical History:  ?Diagnosis Date  ? Allergy   ? dust, seasonal (worse in the fall).  ? Anemia   ? Arthritis   ? 2/2 Lyme Disease. Followed by Pain  Specialist in [place], back and neck  ? Asthma   ? BRONCHITIS  ? Cataract   ? First Dx in 2012  ? Chronic combined systolic and diastolic congestive heart failure ([place])   ? a. 03/2018 Echo: EF 30-35%; b. 07/2018 Echo: EF 35-40%; c. 09/2019 TEE: EF 40-45%; d. 12/2020 Echo: EF 35-40%, sev apical ant, apical lat, apical inf, apical HK. GrI DD, nl RV fxn.e.01/2022  Echo: EF 45-40%  ? Coronary artery disease   ? a. Prior Ant MI->s/p mult stents->LAD/RCA ([place]); b. 2016 Cath: nonobs dzs;  c. 04/2018 Cath/PCI: LCX 58m(3.25x15 SMunjorDES); d. 02/2022 MV: high risk; e. 02/2022 Cath: no significant changes compared to 2019 with exception of progressive stenosis of AV groove  ? Deaf, left   ? Diabetes mellitus without complication ([place]   ? TYPE 2  ? Diabetic peripheral neuropathy ([place]   ? feet and hands  ?  FUO (fever of unknown origin) 08/03/2018  ? GERD (gastroesophageal reflux disease)   ? Headache   ? muscle tension  ? Hyperlipidemia   ? Hypertension   ? CONTROLLED ON MEDS  ? Hyperthyroidism   ? Insomnia   ? Ischemic cardiomyopathy   ? a. 03/2018 Echo: EF 30-35%, ant, antlat, apical AK, Gr1 DD; b. 07/2018 Echo: EF 35-40%, anteroseptal, apical, and ant HK. Gr1 DD; c. 09/2019 TEE: EF 40-45%; d. 12/2020 Echo: EF 35-40%, sev apical ant, apical lat, apical inf, apical HK. GrI DD, nl RV fxn.  ? Knee pain, acute 05/06/2020  ? Left arm weakness 10/04/2019  ? Left leg weakness 12/01/2019  ? Lyme disease   ? Chronic  ? Myasthenia gravis ([place])   ? (03/18/21 - no current treatment - "better than it has ever been" per pt)  ? Myocardial infarction [facility]) 2010  ? Palpitations   ? a. 10/2020 Zio: RSR, 88 avg. 3 brief SVT episodes (max 5 beats @ 128). Rare PACs/PVCs. Triggered events did not correlate w/ significant arrthymia - some w/ sinus tach.  ? Seasonal allergies   ? Sepsis ([place])   ? a.07/2018 - unknown source. TEE neg for veg 09/2019.  ? Sleep apnea   ? CPAP  ? Wears hearing aid in both ears   ? ?Past Surgical History:  ?Procedure Laterality Date   ? BILATERAL CARPAL TUNNEL RELEASE Bilateral L in 2012 and R in 2013  ? CARDIAC CATHETERIZATION    ? Several Caths, most recent in  March 2016.  ? COLONOSCOPY WITH PROPOFOL N/A 01/10/2016  ? Procedure: COLONOSCOP

## 2022-02-10 NOTE — Interval H&P Note (Signed)
History and Physical Interval Note: ? ?02/10/2022 ?5:59 PM ? ?[name]  has presented today for surgery, with the diagnosis of LT Cath w possible PCI    Abnormal cardiac CT.  The various methods of treatment have been discussed with the patient and family. After consideration of risks, benefits and other options for treatment, the patient has consented to  Procedure(s): ?LEFT HEART CATH AND CORONARY ANGIOGRAPHY (Left) as a surgical intervention.  The patient's history has been reviewed, patient examined, no change in status, stable for surgery.  I have reviewed the patient's chart and labs.  Questions were answered to the patient's satisfaction.   ? ? ?[name] ? ? ?

## 2022-02-11 ENCOUNTER — Encounter: Payer: Self-pay | Admitting: Nurse Practitioner

## 2022-02-11 ENCOUNTER — Ambulatory Visit: Payer: Medicare Other | Admitting: Nurse Practitioner

## 2022-02-11 VITALS — BP 106/70 | HR 94 | Ht 67.0 in | Wt 196.4 lb

## 2022-02-11 DIAGNOSIS — Z9989 Dependence on other enabling machines and devices: Secondary | ICD-10-CM

## 2022-02-11 DIAGNOSIS — I25118 Atherosclerotic heart disease of native coronary artery with other forms of angina pectoris: Secondary | ICD-10-CM | POA: Diagnosis not present

## 2022-02-11 DIAGNOSIS — I951 Orthostatic hypotension: Secondary | ICD-10-CM

## 2022-02-11 DIAGNOSIS — G4733 Obstructive sleep apnea (adult) (pediatric): Secondary | ICD-10-CM | POA: Diagnosis not present

## 2022-02-11 DIAGNOSIS — I5042 Chronic combined systolic (congestive) and diastolic (congestive) heart failure: Secondary | ICD-10-CM

## 2022-02-11 DIAGNOSIS — I255 Ischemic cardiomyopathy: Secondary | ICD-10-CM | POA: Diagnosis not present

## 2022-02-11 MED ORDER — NITROGLYCERIN 0.3 MG SL SUBL: 0.3000 mg | SUBLINGUAL_TABLET | SUBLINGUAL | 0 refills | Status: DC | PRN

## 2022-02-11 NOTE — Patient Instructions (Signed)
Medication Instructions:  ?Your physician has recommended you make the following change in your medication:  ? ?Hold Furosemide and monitor daily weights. Please call if you have any increase in shortness of breath or swelling.  ?Continue Jardiance. ? ? ?*If you need a refill on your cardiac medications before your next appointment, please call your pharmacy* ? ? ?Lab Work: ?None ? ?If you have labs (blood work) drawn today and your tests are completely normal, you will receive your results only by: ?MyChart Message (if you have MyChart) OR ?A paper copy in the mail ?If you have any lab test that is abnormal or we need to change your treatment, we will call you to review the results. ? ? ?Testing/Procedures: ?None ? ? ?Follow-Up: ?At [facility], you and your health needs are our priority.  As part of our continuing mission to provide you with exceptional heart care, we have created designated Provider Care Teams.  These Care Teams include your primary Cardiologist (physician) and Advanced Practice Providers (APPs -  Physician Assistants and Nurse Practitioners) who all work together to provide you with the care you need, when you need it. ? ? ?Your next appointment:   ?2 week(s) ? ?The format for your next appointment:   ?In Person ? ?Provider:   ?[name], MD or Murray Hodgkins, NP  ? ? ? ? ?Important Information About Sugar ? ? ? ? ?  ?

## 2022-02-12 ENCOUNTER — Ambulatory Visit: Payer: Medicare Other | Admitting: Podiatry

## 2022-02-16 NOTE — Progress Notes (Signed)
Name: [name]   MRN: [id number]    DOB: [birth year]   Date:02/17/2022 ? ?     Progress Note ? ?Subjective ? ?Chief Complaint ? ?Acute visit for Cough ? ?HPI ? ?[name] has been coughing since first week of March, he was seen by Dr. Raul Del and was given a round of antibiotics and prednisone, he felt better but cough never resolved. He saw Dr. Rosana Berger early May and was given another round of antibiotics . He is back and states tired of coughing, cough is productive at times, has SOB ( chronic and likely multifactorial) . Denies fever or chills. Appetite is normal  ? ?Reviewed notes and CT done 10/2021 ? ?Lungs/Pleura: Diffuse bilateral bronchial wall thickening. Scattered ?areas of ground-glass and heterogeneous airspace opacity throughout ?the bilateral lungs, for example in the medial right upper lobe ?(series 4, image 53) and in the left lower lobe (series 4, image ?74). No pleural effusion or pneumothorax. ? ?Spondyloarthropathy lumbar: chronic low back pain, under the care of Dr. [name], he has intermittent radiculitis down his legs, mostly down left lower leg. Taking Oxycodone ? ?Patient Active Problem List  ? Diagnosis Date Noted  ? Abnormal nuclear cardiac imaging test   ? Seasonal allergies 01/27/2022  ? Hypothyroidism, acquired, autoimmune 01/27/2022  ? 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 Gastropathy 08/13/2021  ? Unintentional weight loss 08/13/2021  ? Iron deficiency anemia due to chronic blood loss   ? Pain due to onychomycosis of toenails of both feet 11/11/2020  ? Lung involvement associated with another disorder ([place]) 05/17/2020  ? Left leg weakness 12/01/2019  ? Lower urinary tract symptoms 09/15/2019  ? Incomplete bladder emptying 09/15/2019  ? Carpal tunnel syndrome on both sides 07/12/2019  ? Weakness generalized 06/21/2019  ? Idiopathic peripheral neuropathy 04/21/2019  ? Sensory ataxia 03/09/2019  ? Colitis 07/08/2018  ? OSA on CPAP 07/08/2018  ? Congestive heart failure (CHF)  ([place]) 05/05/2018  ? Unstable angina ([place]) 04/26/2018  ? Ischemic cardiomyopathy 04/26/2018  ? Chronic combined systolic and diastolic CHF (congestive heart failure) ([place]) 04/26/2018  ? Anginal equivalent ([place]) 04/25/2018  ? Inflammatory spondylopathy of lumbosacral region [facility]) 01/25/2018  ? Vitamin D deficiency, unspecified 08/12/2017  ? Ptosis of left eyelid 01/08/2017  ? Dermatitis 12/24/2016  ? Difficulty walking 04/27/2016  ? Foot cramps 04/27/2016  ? Benign neoplasm of sigmoid colon   ? Benign neoplasm of descending colon   ? Benign neoplasm of transverse colon   ? Coronary artery disease involving native coronary artery with angina pectoris ([place]) 10/22/2015  ? Coronary artery disease 08/22/2015  ? Myasthenia gravis ([place]) 08/22/2015  ? Chronic left-sided low back pain 08/22/2015  ? Hypertension 08/22/2015  ? GERD (gastroesophageal reflux disease) 08/22/2015  ? Hyperlipidemia 08/22/2015  ? Insomnia 08/22/2015  ? Type 2 diabetes mellitus with diabetic polyneuropathy, with long-term current use of insulin ([place]) 08/22/2015  ? Asthma 08/22/2015  ? Lyme disease 06/05/1992  ? ? ?Past Surgical History:  ?Procedure Laterality Date  ? BILATERAL CARPAL TUNNEL RELEASE Bilateral L in 2012 and R in 2013  ? CARDIAC CATHETERIZATION    ? Several Caths, most recent in  March 2016.  ? COLONOSCOPY WITH PROPOFOL N/A 01/10/2016  ? Procedure: COLONOSCOPY WITH PROPOFOL;  Surgeon: [name], MD;  Location: [facility] ENDOSCOPY;  Service: Endoscopy;  Laterality: N/A;  ? COLONOSCOPY WITH PROPOFOL N/A 04/14/2021  ? Procedure: COLONOSCOPY WITH PROPOFOL;  Surgeon: [name], MD;  Location: [place];  Service: Endoscopy;  Laterality: N/A;  Diabetic - insulin and oral meds  ? CORONARY ANGIOPLASTY    ? CORONARY STENT INTERVENTION N/A 04/25/2018  ? Procedure: CORONARY STENT INTERVENTION;  Surgeon: [name], MD;  Location: [place] CV LAB;  Service: Cardiovascular;  Laterality: N/A;  ? ESOPHAGOGASTRODUODENOSCOPY (EGD) WITH  PROPOFOL N/A 01/10/2016  ? Procedure: ESOPHAGOGASTRODUODENOSCOPY (EGD) WITH PROPOFOL;  Surgeon: [name], MD;  Location: [facility] ENDOSCOPY;  Service: Endoscopy;  Laterality: N/A;  ? ESOPHAGOGASTRODUODENOSCOPY (EGD) WITH PROPOFOL N/A 04/14/2021  ? Procedure: ESOPHAGOGASTRODUODENOSCOPY (EGD) WITH BIOPSY;  Surgeon: [name], MD;  Location: [place];  Service: Endoscopy;  Laterality: N/A;  ? EYE SURGERY Bilateral 2012  ? cataract/bilateral vitrectomies  ? GIVENS CAPSULE STUDY N/A 08/20/2021  ? Procedure: GIVENS CAPSULE STUDY;  Surgeon: [name], MD;  [facility] ENDOSCOPY;  Service: Endoscopy;  Laterality: N/A;  ? LEFT HEART CATH AND CORONARY ANGIOGRAPHY Left 04/25/2018  ? Procedure: LEFT HEART CATH AND CORONARY ANGIOGRAPHY;  Surgeon: [name], MD;  Location: [place] CV LAB;  Service: Cardiovascular;  Laterality: Left;  ? LEFT HEART CATH AND CORONARY ANGIOGRAPHY Left 02/02/2022  ? Procedure: LEFT HEART CATH AND CORONARY ANGIOGRAPHY;  Surgeon: [name], MD;  Location: [place] CV LAB;  Service: Cardiovascular;  Laterality: Left;  ? TEE WITHOUT CARDIOVERSION N/A 09/05/2018  ? Procedure: TRANSESOPHAGEAL ECHOCARDIOGRAM (TEE);  Surgeon: [name], MD;  Location: [facility] ORS;  Service: Cardiovascular;  Laterality: N/A;  ? TONSILLECTOMY AND ADENOIDECTOMY    ? As a child  ? TUNNELED VENOUS CATHETER PLACEMENT    ? removed  ? ? ?Family History  ?Problem Relation Age of Onset  ? Diabetes Mother   ? Heart disease Mother   ? Cancer Father   ?     Prostate CA, Anal cancer   ? Dementia Father   ? Diabetes Brother   ? Healthy Brother   ? Healthy Brother   ? ? ?Social History  ? ?Tobacco Use  ? Smoking status: Never  ? Smokeless tobacco: Never  ? Tobacco comments:  ?  smoking cessation materials not required  ?Substance Use Topics  ? Alcohol use: Not Currently  ?  Alcohol/week: 0.0 standard drinks  ? ? ? ?Current Outpatient Medications:  ?  albuterol (PROVENTIL) (2.5 MG/3ML) 0.083%  nebulizer solution, Take 3 mLs (2.5 mg total) by nebulization every 6 (six) hours as needed for wheezing or shortness of breath., Disp: 75 mL, Rfl: 2 ?  albuterol (VENTOLIN HFA) 108 (90 Base) MCG/ACT inhaler, Inhale 2 puffs into the lungs daily., Disp: , Rfl:  ?  ammonium lactate (AMLACTIN) 12 % lotion, Apply 1 application topically as needed for dry skin., Disp: 400 g, Rfl: 3 ?  aspirin EC 81 MG tablet, Take 1 tablet (81 mg total) by mouth daily., Disp: 90 tablet, Rfl: 3 ?  benzonatate (TESSALON) 100 MG capsule, Take 2 capsules (200 mg total) by mouth 3 (three) times daily as needed., Disp: 90 capsule, Rfl: 0 ?  clopidogrel (PLAVIX) 75 MG tablet, TAKE 1 TABLET(75 MG) BY MOUTH DAILY WITH BREAKFAST, Disp: 30 tablet, Rfl: 0 ?  cyclobenzaprine (FLEXERIL) 10 MG tablet, Take 10 mg by mouth 2 (two) times daily., Disp: , Rfl:  ?  dexlansoprazole (DEXILANT) 60 MG capsule, Take 1 capsule (60 mg total) by mouth daily. (Patient taking differently: Take 1 capsule by mouth daily with supper.), Disp: 30 capsule, Rfl: 11 ?  DULoxetine (CYMBALTA) 30 MG capsule, Take 1 capsule (30 mg total) by mouth daily.,  Disp: 90 capsule, Rfl: 1 ?  DULoxetine (CYMBALTA) 60 MG capsule, Take 1 capsule (60 mg total) by mouth daily. (Patient taking differently: Take 60 mg by mouth at bedtime.), Disp: 90 capsule, Rfl: 1 ?  ezetimibe (ZETIA) 10 MG tablet, TAKE 1 TABLET(10 MG) BY MOUTH DAILY, Disp: 90 tablet, Rfl: 0 ?  furosemide (LASIX) 20 MG tablet, TAKE 1 TABLET BY MOUTH DAILY AS NEEDED FOR WEIGHT GAIN OF 2 POUNDS OVERNIGHT OR 5 POUNDS PER WEEK (Patient taking differently: Take 20 mg by mouth every other day.), Disp: 90 tablet, Rfl: 0 ?  gabapentin (NEURONTIN) 300 MG capsule, Take 1 capsule (300 mg total) by mouth 3 (three) times daily., Disp: 90 capsule, Rfl: 1 ?  gabapentin (NEURONTIN) 800 MG tablet, Take 800 mg by mouth at bedtime., Disp: , Rfl:  ?  Humidifier/Vaporizer Supplies (VICKS VAPORIZER SCENT) PADS, Apply 1 application. topically daily as  needed (cough)., Disp: , Rfl:  ?  insulin lispro (HUMALOG) 100 UNIT/ML KiwkPen, Inject 7-8 Units into the skin 3 (three) times daily. Reported on 12/02/2015/ sliding scale 1 unit for every 8 units of carbs; and

## 2022-02-17 ENCOUNTER — Encounter: Payer: Self-pay | Admitting: Family Medicine

## 2022-02-17 ENCOUNTER — Ambulatory Visit
Admission: RE | Admit: 2022-02-17 | Discharge: 2022-02-17 | Disposition: A | Discharge status: Home / Self Care | Payer: Medicare Other | Attending: Family Medicine | Admitting: Family Medicine

## 2022-02-17 ENCOUNTER — Ambulatory Visit: Payer: Medicare Other | Admitting: Family Medicine

## 2022-02-17 ENCOUNTER — Ambulatory Visit
Admission: RE | Admit: 2022-02-17 | Discharge: 2022-02-17 | Disposition: A | Discharge status: Home / Self Care | Payer: Medicare Other | Source: Ambulatory Visit | Attending: Family Medicine | Admitting: Family Medicine

## 2022-02-17 VITALS — BP 128/80 | HR 99 | Temp 98.0°F | Resp 18 | Ht 67.0 in | Wt 200.0 lb

## 2022-02-17 DIAGNOSIS — R062 Wheezing: Secondary | ICD-10-CM | POA: Insufficient documentation

## 2022-02-17 DIAGNOSIS — M4697 Unspecified inflammatory spondylopathy, lumbosacral region: Secondary | ICD-10-CM

## 2022-02-17 DIAGNOSIS — J99 Respiratory disorders in diseases classified elsewhere: Secondary | ICD-10-CM | POA: Diagnosis present

## 2022-02-17 DIAGNOSIS — R053 Chronic cough: Secondary | ICD-10-CM

## 2022-02-17 MED ORDER — PREDNISONE 10 MG PO TABS: 10.0000 mg | ORAL_TABLET | Freq: Two times a day (BID) | ORAL | 0 refills | Status: DC

## 2022-02-17 MED ORDER — BENZONATATE 100 MG PO CAPS: 200.0000 mg | ORAL_CAPSULE | Freq: Three times a day (TID) | ORAL | 0 refills | Status: DC | PRN

## 2022-02-17 NOTE — Patient Instructions (Signed)
Take 30 mg daily for 7 days and after that go down to 20 mg for a few days followed by 10 mg until all gone  ?

## 2022-02-19 ENCOUNTER — Encounter: Payer: Self-pay | Admitting: Podiatry

## 2022-02-19 ENCOUNTER — Ambulatory Visit: Payer: Medicare Other | Admitting: Podiatry

## 2022-02-19 DIAGNOSIS — E1142 Type 2 diabetes mellitus with diabetic polyneuropathy: Secondary | ICD-10-CM

## 2022-02-19 DIAGNOSIS — M79674 Pain in right toe(s): Secondary | ICD-10-CM | POA: Diagnosis not present

## 2022-02-19 DIAGNOSIS — B351 Tinea unguium: Secondary | ICD-10-CM

## 2022-02-19 DIAGNOSIS — M79675 Pain in left toe(s): Secondary | ICD-10-CM

## 2022-02-19 NOTE — Progress Notes (Signed)
This patient presents  to my office for at risk foot care.  This patient requires this care by a professional since this patient will be at risk due to having diabetic neuropathy.  This patient is unable to cut nails himself since the patient cannot reach his nails.These nails are painful walking and wearing shoes.  This patient presents for at risk foot care today.  General Appearance  Alert, conversant and in no acute stress.  Vascular  Dorsalis pedis and posterior tibial  pulses are palpable  bilaterally.  Capillary return is within normal limits  bilaterally. Temperature is within normal limits  bilaterally.  Neurologic  Senn-Weinstein monofilament wire test absent   bilaterally. Muscle power within normal limits bilaterally.  Nails Thick disfigured discolored nails with subungual debris  Hallux nails bilaterally. No evidence of bacterial infection or drainage bilaterally.  Orthopedic  No limitations of motion  feet .  No crepitus or effusions noted.  No bony pathology or digital deformities noted.  Mild  MTA.  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  Skin   No porokeratosis noted bilaterally.  No signs of infections or ulcers noted.  Dry peeling skin plantarly both feet.   Onychomycosis Hallux    Pain in right toes  Pain in left toes  Diabetic neuropathy.  Consent was obtained for treatment procedures.   Mechanical debridement of nails 1-5  bilaterally performed with a nail nipper.  Filed with dremel without incident. Prescribe amlatin.     Return office visit   10 weeks                 Told patient to return for periodic foot care and evaluation due to potential at risk complications.   Brigg Cape DPM  

## 2022-02-25 ENCOUNTER — Encounter: Payer: Self-pay | Admitting: Nurse Practitioner

## 2022-02-25 ENCOUNTER — Ambulatory Visit: Payer: Medicare Other | Admitting: Nurse Practitioner

## 2022-02-25 VITALS — BP 112/70 | HR 95 | Ht 67.0 in | Wt 193.0 lb

## 2022-02-25 DIAGNOSIS — I5042 Chronic combined systolic (congestive) and diastolic (congestive) heart failure: Secondary | ICD-10-CM

## 2022-02-25 DIAGNOSIS — I25118 Atherosclerotic heart disease of native coronary artery with other forms of angina pectoris: Secondary | ICD-10-CM

## 2022-02-25 DIAGNOSIS — I255 Ischemic cardiomyopathy: Secondary | ICD-10-CM | POA: Diagnosis not present

## 2022-02-25 DIAGNOSIS — I951 Orthostatic hypotension: Secondary | ICD-10-CM

## 2022-02-25 DIAGNOSIS — E785 Hyperlipidemia, unspecified: Secondary | ICD-10-CM

## 2022-02-25 NOTE — Progress Notes (Signed)
Office Visit    Patient Name: [name] Date of Encounter: 02/25/2022  Primary Care Provider:  [name], MD Primary Cardiologist:  [name], MD  Chief Complaint    61 year old male with history of CAD status post multiple PCI's, ischemic cardiomyopathy, HFrEF, hypertension, hyperlipidemia, type 2 diabetes mellitus, diabetic neuropathy, myasthenia gravis, sepsis, and obstructive sleep apnea, who presents for follow-up of orthostatic hypotension, CAD, and heart failure.  Past Medical History    Past Medical History:  Diagnosis Date   Allergy    dust, seasonal (worse in the fall).   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 Followed by Pain Specialist in [place], back and neck   Asthma    BRONCHITIS   Cataract    First Dx in 2012   Chronic combined systolic and diastolic congestive heart failure ([place])    a. 03/2018 Echo: EF 30-35%; b. 07/2018 Echo: EF 35-40%; c. 09/2019 TEE: EF 40-45%; d. 12/2020 Echo: EF 35-40%, sev apical ant, apical lat, apical inf, apical HK. GrI DD, nl RV fxn.e.01/2022  Echo: EF 45-50%.   Coronary artery disease    a. Prior Ant MI->s/p mult stents->LAD/RCA ([place]); b. 2016 Cath: nonobs dzs;  c. 04/2018 Cath/PCI: LCX 11m(3.25x15 SRampartDES); d. 02/2022 MV: high risk; e. 02/2022 Cath: LM nl, LAD 20p ISR, patent mid-stent, LCX patent stent, OM1 nl, OM2 50p, patent stent, OM3 40p, patent stent, RCA 40p ISR, 252mSR, 4036m patent distal stent, RPDA patent stent, RPAV small, 80 (sl progression)-->Med Rx.   Deaf, left    Diabetes mellitus without complication ([name]  TYPE 2   Diabetic peripheral neuropathy ([place]  feet and hands   FUO (fever of unknown origin) 08/03/2018   GERD (gastroesophageal reflux disease)    Headache    muscle tension   Hyperlipidemia    Hypertension    Hyperthyroidism    Insomnia    Ischemic cardiomyopathy    a. 03/2018 Echo: EF 30-35%; b. 07/2018 Echo: EF 35-40%, Gr1 DD; c. 09/2019 TEE: EF 40-45%; d. 12/2020 Echo: EF 35-40%.  GrI DD; e. 01/2022 Echo: EF 45-50%, no rwma, sev apical HK, nl RV fxn, Ao root 43m42m Knee pain, acute 05/06/2020   Left arm weakness 10/04/2019   Left leg weakness 12/01/2019   Lyme disease    Chronic   Myasthenia gravis (HCC)[place] (03/18/21 - no current treatment - "better than it has ever been" per pt)   Myocardial infarction (HCC)[email or web address]   Palpitations    a. 10/2020 Zio: RSR, 88 avg. 3 brief SVT episodes (max 5 beats @ 128). Rare PACs/PVCs. Triggered events did not correlate w/ significant arrthymia - some w/ sinus tach.   Seasonal allergies    Sepsis (HCC)[place] a.07/2018 - unknown source. TEE neg for veg 09/2019.   Sleep apnea    CPAP   Wears hearing aid in both ears    Past Surgical History:  Procedure Laterality Date   BILATERAL CARPAL TUNNEL RELEASE Bilateral L in 2012 and R in 2013   CARDShelbinast recent in  March 2016.   COLONOSCOPY WITH PROPOFOL N/A 01/10/2016   Procedure: COLONOSCOPY WITH PROPOFOL;  Surgeon: DarrLucilla Lame;  Location: [facility] END[facility]COPY;  Service: Endoscopy;  Laterality: N/A;   COLONOSCOPY WITH PROPOFOL N/A 04/14/2021   Procedure: COLONOSCOPY WITH PROPOFOL;  Surgeon: WohlLucilla Lame;  [place]  Service: Endoscopy;  Laterality: N/A;  Diabetic - insulin and oral meds   CORONARY ANGIOPLASTY     CORONARY STENT INTERVENTION N/A 04/25/2018   Procedure: CORONARY STENT INTERVENTION;  Surgeon: [name], MD;  Location: [place] [facility];  Service: Cardiovascular;  Laterality: N/A;   ESOPHAGOGASTRODUODENOSCOPY (EGD) WITH PROPOFOL N/A 01/10/2016   Procedure: ESOPHAGOGASTRODUODENOSCOPY (EGD) WITH PROPOFOL;  Surgeon: [name], MD;  Location: [facility];  Service: Endoscopy;  Laterality: N/A;   ESOPHAGOGASTRODUODENOSCOPY (EGD) WITH PROPOFOL N/A 04/14/2021   Procedure: ESOPHAGOGASTRODUODENOSCOPY (EGD) WITH BIOPSY;  Surgeon: [name], MD;  Location: [place];  Service: Endoscopy;  Laterality: N/A;    EYE SURGERY Bilateral 2012   cataract/bilateral vitrectomies   GIVENS CAPSULE STUDY N/A 08/20/2021   Procedure: GIVENS CAPSULE STUDY;  Surgeon: [name], MD;  Location: [facility] ENDOSCOPY;  Service: Endoscopy;  Laterality: N/A;   LEFT HEART CATH AND CORONARY ANGIOGRAPHY Left 04/25/2018   Procedure: LEFT HEART CATH AND CORONARY ANGIOGRAPHY;  Surgeon: [name], MD;  Location: [place] [facility];  Service: Cardiovascular;  Laterality: Left;   LEFT HEART CATH AND CORONARY ANGIOGRAPHY Left 02/02/2022   Procedure: LEFT HEART CATH AND CORONARY ANGIOGRAPHY;  Surgeon: [name], MD;  Location: [place] [facility];  Service: Cardiovascular;  Laterality: Left;   TEE WITHOUT CARDIOVERSION N/A 09/05/2018   Procedure: TRANSESOPHAGEAL ECHOCARDIOGRAM (TEE);  Surgeon: [name], MD;  Location: [facility];  Service: Cardiovascular;  Laterality: N/A;   TONSILLECTOMY AND ADENOIDECTOMY     As a child   TUNNELED VENOUS CATHETER PLACEMENT     removed    Allergies  Allergies  Allergen Reactions   Azathioprine Other (See Comments)    Azathioprine hypersensitivity reaction - Symptoms mimicking sepsis - was hospitalized   Novolog [Insulin Aspart] Hives    History of Present Illness    61 year old male with the above complex past medical history including CAD status post multiple PCI's, ischemic cardiomyopathy, HFrEF, hypertension, hyperlipidemia, type 2 diabetes mellitus, diabetic neuropathy, myasthenia gravis, sepsis, and obstructive sleep apnea.  He initially suffered a myocardial infarction in 2009 while living in [place].  He has had approximately 10 stents between the LAD and right coronary artery over time.  In June 2019, he had worsening dyspnea on exertion and chest pain.  Echo showed an EF of 30 to 35%.  Cath showed a new 90% stenosis in the mid left circumflex with multiple patent LAD, RCA, and OM 2 stents.  The circumflex was successfully treated with a drug-eluting stent.  He was  admitted twice in the fall 2019 with sepsis.  TEE in December 2019 showed an EF of 40 to 45% without vegetation.  In October 2021, had a syncopal episode in the setting of coughing.  He was subsequently found to be orthostatic and was encouraged to increase p.o. fluids.  ZIO monitoring in January 2022, did not show any significant arrhythmias.  Triggered events were so she was sinus tachycardia.  Most recent echocardiogram in March 2022 continue to show LV dysfunction with an EF of 35 to 40%, severe apical anterior, apical lateral, apical inferior, and apical hypokinesis.  He also has grade 1 diastolic dysfunction, and normal RV function.  Guideline directed medical therapy has historically been limited by dizziness and orthostasis.  He did not tolerate Entresto previously.  In early March 2023, he reported orthostatic lightheadedness, and his metoprolol dose was initially reduced from 100 mg daily to 50 mg daily.  He was also encouraged to  use Lasix on an as-needed basis only.  In early April, he was seen due to persistent orthostatic lightheadedness, and also reported chest discomfort.  Metoprolol was reduced further to 25 mg daily and he subsequently underwent stress testing which was high risk, with partially reversible defects in the apical to mid inferior, inferolateral, and apical to mid anterior locations.  He underwent diagnostic catheterization on May 1 showing predominantly moderate, nonobstructive disease with minimal in-stent restenosis.  He did have some progression of small vessel-RPAV disease (80%), and medical therapy was recommended.  He continued to experience orthostatic lightheadedness metoprolol was first reduced further to 12.5 mg daily and then subsequently discontinued altogether at follow-up visit on May 10.  Since then, he has noted complete resolution of orthostatic lightheadedness.  Further, he has not been having any significant chest pain or dyspnea.  He has not required any as  needed Lasix, as his weight has been stable at home.  He denies PND, palpitations, orthopnea, dizziness, syncope, edema, or early satiety.  He notes that dyspnea improved after treatment for lung infection with steroids and antibiotics within the past 2 weeks.  Home Medications    Current Outpatient Medications  Medication Sig Dispense Refill   albuterol (PROVENTIL) (2.5 MG/3ML) 0.083% nebulizer solution Take 3 mLs (2.5 mg total) by nebulization every 6 (six) hours as needed for wheezing or shortness of breath. 75 mL 2   albuterol (VENTOLIN HFA) 108 (90 Base) MCG/ACT inhaler Inhale 2 puffs into the lungs daily.     ammonium lactate (AMLACTIN) 12 % lotion Apply 1 application topically as needed for dry skin. 400 g 3   aspirin EC 81 MG tablet Take 1 tablet (81 mg total) by mouth daily. 90 tablet 3   benzonatate (TESSALON) 100 MG capsule Take 2 capsules (200 mg total) by mouth 3 (three) times daily as needed. 90 capsule 0   clopidogrel (PLAVIX) 75 MG tablet TAKE 1 TABLET(75 MG) BY MOUTH DAILY WITH BREAKFAST 30 tablet 0   cyclobenzaprine (FLEXERIL) 10 MG tablet Take 10 mg by mouth 2 (two) times daily.     dexlansoprazole (DEXILANT) 60 MG capsule Take 1 capsule (60 mg total) by mouth daily. (Patient taking differently: Take 1 capsule by mouth daily with supper.) 30 capsule 11   DULoxetine (CYMBALTA) 30 MG capsule Take 1 capsule (30 mg total) by mouth daily. 90 capsule 1   DULoxetine (CYMBALTA) 60 MG capsule Take 1 capsule (60 mg total) by mouth daily. (Patient taking differently: Take 60 mg by mouth at bedtime.) 90 capsule 1   ezetimibe (ZETIA) 10 MG tablet TAKE 1 TABLET(10 MG) BY MOUTH DAILY 90 tablet 0   furosemide (LASIX) 20 MG tablet TAKE 1 TABLET BY MOUTH DAILY AS NEEDED FOR WEIGHT GAIN OF 2 POUNDS OVERNIGHT OR 5 POUNDS PER WEEK (Patient taking differently: Take 20 mg by mouth every other day.) 90 tablet 0   gabapentin (NEURONTIN) 300 MG capsule Take 1 capsule (300 mg total) by mouth 3 (three)  times daily. 90 capsule 1   gabapentin (NEURONTIN) 800 MG tablet Take 800 mg by mouth at bedtime.     Humidifier/Vaporizer Supplies (VICKS VAPORIZER SCENT) PADS Apply 1 application. topically daily as needed (cough).     insulin lispro (HUMALOG) 100 UNIT/ML KiwkPen Inject 7-8 Units into the skin 3 (three) times daily. Reported on 12/02/2015/ sliding scale 1 unit for every 8 units of carbs; and 1 unit for every 20 above 120     Insulin Pen Needle 32G X  4 MM MISC Inject 1 each into the skin as needed.     JARDIANCE 10 MG TABS tablet TAKE 1 TABLET(10 MG) BY MOUTH DAILY BEFORE BREAKFAST 30 tablet 4   LANTUS SOLOSTAR 100 UNIT/ML Solostar Pen Inject 20-26 Units into the skin daily at 10 pm. Sliding scale based on blood glucose  0   levocetirizine (XYZAL) 5 MG tablet Take 1 tablet (5 mg total) by mouth every evening. 90 tablet 0   levothyroxine (SYNTHROID) 50 MCG tablet TAKE 1 TABLET BY MOUTH DAILY MONDAY THROUGH SATURDAY AND 2 TABLETS EVERY SUNDAY, TAKE BEFORE BREAKFAST(RECHECK LEVELS IN 6 WEEKS) 102 tablet 0   loratadine (CLARITIN) 10 MG tablet Take 10 mg by mouth in the morning.     metFORMIN (GLUCOPHAGE) 1000 MG tablet Take 1,000 mg by mouth 2 (two) times daily with a meal.     montelukast (SINGULAIR) 10 MG tablet Take 10 mg by mouth at bedtime.     Multiple Vitamins-Minerals (MENS MULTI VITAMIN & MINERAL PO) Take 1 tablet by mouth in the morning and at bedtime.     naloxone (NARCAN) nasal spray 4 mg/0.1 mL For excess sedation from opioids 1 kit 2   nitroGLYCERIN (NITROSTAT) 0.3 MG SL tablet Place 1 tablet (0.3 mg total) under the tongue every 5 (five) minutes as needed for chest pain. Maximum 3 doses. 25 tablet 0   Oxycodone HCl 10 MG TABS Take 1 tablet (10 mg total) by mouth 4 (four) times daily. 120 tablet 0   [START ON 02/27/2022] Oxycodone HCl 10 MG TABS Take 1 tablet (10 mg total) by mouth in the morning, at noon, in the evening, and at bedtime. 120 tablet 0   oxymetazoline (AFRIN) 0.05 % nasal  spray Place 1 spray into both nostrils at bedtime as needed for congestion.     OZEMPIC, 1 MG/DOSE, 4 MG/3ML SOPN Inject 1 mg into the skin once a week.     predniSONE (DELTASONE) 10 MG tablet Take 1-2 tablets (10-20 mg total) by mouth 2 (two) times daily with a meal. 30 tablet 0   rosuvastatin (CRESTOR) 5 MG tablet Take 5 mg by mouth daily with supper.  0    Review of Systems    Feeling well today.  He denies chest pain, palpitations, dyspnea, pnd, orthopnea, n, v, dizziness, syncope, edema, weight gain, or early satiety.  All other systems reviewed and are otherwise negative except as noted above.    Physical Exam    VS:  BP 112/70 (BP Location: Left Arm, Patient Position: Sitting, Cuff Size: Normal)   Pulse 95   Ht _0  (1.702 m)   Wt 193 lb (87.5 kg)   SpO2 97%   BMI 30.23 kg/m  , BMI Body mass index is 30.23 kg/m.     GEN: Well nourished, well developed, in no acute distress. HEENT: normal. Neck: Supple, no JVD, carotid bruits, or masses. Cardiac: RRR, no murmurs, rubs, or gallops. No clubbing, cyanosis, edema.  Radials/PT 2+ and equal bilaterally.  Respiratory:  Respirations regular and unlabored, clear to auscultation bilaterally. GI: Soft, nontender, nondistended, BS + x 4. MS: no deformity or atrophy. Skin: warm and dry, no rash. Neuro:  Strength and sensation are intact. Psych: Normal affect.  Accessory Clinical Findings    ECG personally reviewed by me today -regular sinus rhythm, 95, right bundle branch block, prior inferior and anterolateral infarcts- no acute changes.  Lab Results  Component Value Date   WBC 6.2 01/28/2022   HGB 13.5  01/28/2022   HCT 42.9 01/28/2022   MCV 93.3 01/28/2022   PLT 198 01/28/2022   Lab Results  Component Value Date   CREATININE 1.09 01/28/2022   BUN 17 01/28/2022   NA 137 01/28/2022   K 4.7 01/28/2022   CL 101 01/28/2022   CO2 29 01/28/2022   Lab Results  Component Value Date   ALT 21 09/23/2021   AST 18 09/23/2021    ALKPHOS 99 07/17/2021   BILITOT 0.4 09/23/2021   Lab Results  Component Value Date   CHOL 92 (L) 07/17/2021   HDL 28 (L) 07/17/2021   LDLCALC 39 07/17/2021   LDLDIRECT 36 07/17/2021   TRIG 145 07/17/2021   CHOLHDL 3.3 07/17/2021    Lab Results  Component Value Date   HGBA1C 7.6 (A) 10/21/2021    Assessment & Plan    1.  Orthostatic hypotension: This has now resolved following downward titration and subsequent discontinuation of metoprolol therapy.  He has not had any recurrent lightheadedness/presyncope.  2.  Coronary artery disease: Status post multiple prior stents with recent abnormal stress test and subsequent catheterization showing patent stents with minimal in-stent restenosis, and some progression of small vessel disease involving the RPAV.  Medical therapy was warranted however, is limited by soft blood pressures and orthostasis.  He remains on aspirin, Zetia, and statin therapy.  3.  Chronic heart failure with reduced ejection fraction/ischemic cardiomyopathy: Recent echo with slight improvement in EF to 45 to 50% with apical hypokinesis, and normal LV function.  Euvolemic on examination today and feeling well.  As previously noted, guideline directed medical therapy has been limited by relative hypotension and orthostasis.  He remains on Jardiance therapy and has as needed Lasix, but fortunately, has not required any doses.  As he is finally feeling well, we agreed upon no med changes today however, in the setting of chronically elevated heart rate, we may wish to consider ivabradine in the future.  4.  Essential hypertension: See #1.  Not on any antihypertensives at this time with normal blood pressure.  5.  Hyperlipidemia: LDL of 36 in October 2022.  Continue rosuvastatin and Zetia therapy.  6.  Type 2 diabetes mellitus: A1c 7.6 in January.  On multiple agents and followed by endocrinology.  7.  Obstructive sleep apnea: Compliant with CPAP.  8.  Disposition: Follow-up  in clinic in 2 to 3 months or sooner if necessary.   Murray Hodgkins, NP 02/25/2022, 5:39 PM

## 2022-02-25 NOTE — Patient Instructions (Signed)
Medication Instructions:  - Your physician recommends that you continue on your current medications as directed. Please refer to the Current Medication list given to you today.  *If you need a refill on your cardiac medications before your next appointment, please call your pharmacy*   Lab Work: - none ordered  If you have labs (blood work) drawn today and your tests are completely normal, you will receive your results only by: [place] (if you have MyChart) OR A paper copy in the mail If you have any lab test that is abnormal or we need to change your treatment, we will call you to review the results.   Testing/Procedures: - none ordered   Follow-Up: At [facility], you and your health needs are our priority.  As part of our continuing mission to provide you with exceptional heart care, we have created designated Provider Care Teams.  These Care Teams include your primary Cardiologist (physician) and Advanced Practice Providers (APPs -  Physician Assistants and Nurse Practitioners) who all work together to provide you with the care you need, when you need it.  We recommend signing up for the patient portal called "MyChart".  Sign up information is provided on this After Visit Summary.  MyChart is used to connect with patients for Virtual Visits (Telemedicine).  Patients are able to view lab/test results, encounter notes, upcoming appointments, etc.  Non-urgent messages can be sent to your provider as well.   To learn more about what you can do with MyChart, go to [email or web address].    Your next appointment:   2-3 month(s)  The format for your next appointment:   In Person  Provider:   You may see [name], MD or one of the following Advanced Practice Providers on your designated Care Team:   Murray Hodgkins, NP    Other Instructions N/a  Important Information About Sugar

## 2022-03-12 ENCOUNTER — Other Ambulatory Visit: Payer: Self-pay | Admitting: Nurse Practitioner

## 2022-03-12 ENCOUNTER — Other Ambulatory Visit: Payer: Self-pay | Admitting: Family Medicine

## 2022-03-12 DIAGNOSIS — I255 Ischemic cardiomyopathy: Secondary | ICD-10-CM

## 2022-03-12 DIAGNOSIS — I25118 Atherosclerotic heart disease of native coronary artery with other forms of angina pectoris: Secondary | ICD-10-CM

## 2022-03-24 ENCOUNTER — Other Ambulatory Visit: Payer: Self-pay | Admitting: Cardiovascular Disease

## 2022-03-24 ENCOUNTER — Encounter: Payer: Self-pay | Admitting: Anesthesiology

## 2022-03-24 LAB — HEMOGLOBIN A1C: Hemoglobin A1C: 7.8

## 2022-03-24 MED ORDER — OXYCODONE HCL 10 MG PO TABS: 10.0000 mg | ORAL_TABLET | Freq: Four times a day (QID) | ORAL | 0 refills | Status: DC

## 2022-03-24 MED ORDER — OXYCODONE HCL 10 MG PO TABS: 10.0000 mg | ORAL_TABLET | Freq: Four times a day (QID) | ORAL | 0 refills | Status: AC

## 2022-03-24 NOTE — Progress Notes (Signed)
Virtual Visit via Telephone Note  I connected with [name] on 03/24/22 at  4:00 PM EDT by telephone and verified that I am speaking with the correct person using two identifiers.  Location: Patient: Home Provider: [facility]   I discussed the limitations, risks, security and privacy concerns of performing an evaluation and management service by telephone and the availability of in person appointments. I also discussed with the patient that there may be a patient responsible charge related to this service. The patient expressed understanding and agreed to proceed.   History of Present Illness: I spoke with [name] via telephone as we were unable like for the video portion of the conference.  He is done very well following his most recent epidural back in February but is getting recurrence of the left lower leg sciatica and back pain.  Despite taking the oxycodone 4 times a day for his diffuse body pain and low back pain the sciatica is unremitting.  He desires to proceed with a repeat epidural as scheduled as soon as possible.  Unfortunately he has failed more conservative therapy.  He has been through physical therapy and done home exercises with minimal relief.  He gets about 75 to 100% relief of his sciatica and about 75% relief of his back pain generally lasting over 2 months before he gets recurrence of the same pain.  He is experiencing this at this time.  No change in his left lower extremity weakness no change in bowel or bladder function.  He is taking his medication as prescribed and these continue to work well for him.  He gets good relief but the sciatica is persistent he reports.  Review of systems: General: No fevers or chills Pulmonary: No shortness of breath or dyspnea Cardiac: No angina or palpitations or lightheadedness GI: No abdominal pain or constipation Psych: No depression    Observations/Objective:  Current Outpatient Medications:    [START ON  04/28/2022] Oxycodone HCl 10 MG TABS, Take 1 tablet (10 mg total) by mouth every 6 (six) hours., Disp: 120 tablet, Rfl: 0   albuterol (PROVENTIL) (2.5 MG/3ML) 0.083% nebulizer solution, Take 3 mLs (2.5 mg total) by nebulization every 6 (six) hours as needed for wheezing or shortness of breath., Disp: 75 mL, Rfl: 2   albuterol (VENTOLIN HFA) 108 (90 Base) MCG/ACT inhaler, Inhale 2 puffs into the lungs daily., Disp: , Rfl:    ammonium lactate (AMLACTIN) 12 % lotion, Apply 1 application topically as needed for dry skin., Disp: 400 g, Rfl: 3   aspirin EC 81 MG tablet, Take 1 tablet (81 mg total) by mouth daily., Disp: 90 tablet, Rfl: 3   benzonatate (TESSALON) 100 MG capsule, Take 2 capsules (200 mg total) by mouth 3 (three) times daily as needed., Disp: 90 capsule, Rfl: 0   clopidogrel (PLAVIX) 75 MG tablet, TAKE 1 TABLET(75 MG) BY MOUTH DAILY WITH BREAKFAST, Disp: 30 tablet, Rfl: 0   cyclobenzaprine (FLEXERIL) 10 MG tablet, Take 10 mg by mouth 2 (two) times daily., Disp: , Rfl:    dexlansoprazole (DEXILANT) 60 MG capsule, Take 1 capsule (60 mg total) by mouth daily. (Patient taking differently: Take 1 capsule by mouth daily with supper.), Disp: 30 capsule, Rfl: 11   DULoxetine (CYMBALTA) 30 MG capsule, Take 1 capsule (30 mg total) by mouth daily., Disp: 90 capsule, Rfl: 1   DULoxetine (CYMBALTA) 60 MG capsule, Take 1 capsule (60 mg total) by mouth daily. (Patient taking differently: Take 60 mg by  mouth at bedtime.), Disp: 90 capsule, Rfl: 1   ezetimibe (ZETIA) 10 MG tablet, TAKE 1 TABLET(10 MG) BY MOUTH DAILY, Disp: 90 tablet, Rfl: 0   furosemide (LASIX) 20 MG tablet, TAKE 1 TABLET BY MOUTH DAILY AS NEEDED FOR WEIGHT GAIN OF 2 POUNDS OVERNIGHT OR 5 POUNDS PER WEEK (Patient taking differently: Take 20 mg by mouth every other day.), Disp: 90 tablet, Rfl: 0   gabapentin (NEURONTIN) 300 MG capsule, Take 1 capsule (300 mg total) by mouth 3 (three) times daily., Disp: 90 capsule, Rfl: 1   gabapentin (NEURONTIN)  800 MG tablet, Take 800 mg by mouth at bedtime., Disp: , Rfl:    Humidifier/Vaporizer Supplies (VICKS VAPORIZER SCENT) PADS, Apply 1 application. topically daily as needed (cough)., Disp: , Rfl:    insulin lispro (HUMALOG) 100 UNIT/ML KiwkPen, Inject 7-8 Units into the skin 3 (three) times daily. Reported on 12/02/2015/ sliding scale 1 unit for every 8 units of carbs; and 1 unit for every 20 above 120, Disp: , Rfl:    Insulin Pen Needle 32G X 4 MM MISC, Inject 1 each into the skin as needed., Disp: , Rfl:    JARDIANCE 10 MG TABS tablet, TAKE 1 TABLET(10 MG) BY MOUTH DAILY BEFORE BREAKFAST, Disp: 30 tablet, Rfl: 4   LANTUS SOLOSTAR 100 UNIT/ML Solostar Pen, Inject 20-26 Units into the skin daily at 10 pm. Sliding scale based on blood glucose, Disp: , Rfl: 0   levocetirizine (XYZAL) 5 MG tablet, Take 1 tablet (5 mg total) by mouth every evening., Disp: 90 tablet, Rfl: 0   levothyroxine (SYNTHROID) 50 MCG tablet, TAKE 1 TABLET BY MOUTH DAILY MONDAY THROUGH SATURDAY AND 2 TABLETS EVERY SUNDAY, TAKE BEFORE BREAKFAST(RECHECK LEVELS IN 6 WEEKS), Disp: 102 tablet, Rfl: 0   loratadine (CLARITIN) 10 MG tablet, Take 10 mg by mouth in the morning., Disp: , Rfl:    metFORMIN (GLUCOPHAGE) 1000 MG tablet, Take 1,000 mg by mouth 2 (two) times daily with a meal., Disp: , Rfl:    montelukast (SINGULAIR) 10 MG tablet, Take 10 mg by mouth at bedtime., Disp: , Rfl:    Multiple Vitamins-Minerals (MENS MULTI VITAMIN & MINERAL PO), Take 1 tablet by mouth in the morning and at bedtime., Disp: , Rfl:    naloxone (NARCAN) nasal spray 4 mg/0.1 mL, For excess sedation from opioids, Disp: 1 kit, Rfl: 2   nitroGLYCERIN (NITROSTAT) 0.3 MG SL tablet, DISSOLVE 1 TABLET UNDER THE TONGUE EVERY 5 MINUTES AS NEEDED FOR CHEST PAIN. MAXIMUM 3 DOSES., Disp: 100 tablet, Rfl: 3   [START ON 03/30/2022] Oxycodone HCl 10 MG TABS, Take 1 tablet (10 mg total) by mouth in the morning, at noon, in the evening, and at bedtime., Disp: 120 tablet, Rfl: 0    oxymetazoline (AFRIN) 0.05 % nasal spray, Place 1 spray into both nostrils at bedtime as needed for congestion., Disp: , Rfl:    OZEMPIC, 1 MG/DOSE, 4 MG/3ML SOPN, Inject 1 mg into the skin once a week., Disp: , Rfl:    predniSONE (DELTASONE) 10 MG tablet, Take 1-2 tablets (10-20 mg total) by mouth 2 (two) times daily with a meal., Disp: 30 tablet, Rfl: 0   rosuvastatin (CRESTOR) 5 MG tablet, Take 5 mg by mouth daily with supper., Disp: , Rfl: 0  Current Facility-Administered Medications:    sodium chloride flush (NS) 0.9 % injection 3 mL, 3 mL, Intravenous, Q12H, Furth, Cadence H, PA-C   Past Medical History:  Diagnosis Date   Allergy  dust, seasonal (worse in the fall).   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 Followed by Pain Specialist in [place], back and neck   Asthma    BRONCHITIS   Cataract    First Dx in 2012   Chronic combined systolic and diastolic congestive heart failure ([place])    a. 03/2018 Echo: EF 30-35%; b. 07/2018 Echo: EF 35-40%; c. 09/2019 TEE: EF 40-45%; d. 12/2020 Echo: EF 35-40%, sev apical ant, apical lat, apical inf, apical HK. GrI DD, nl RV fxn.e.01/2022  Echo: EF 45-50%.   Coronary artery disease    a. Prior Ant MI->s/p mult stents->LAD/RCA ([place]); b. 2016 Cath: nonobs dzs;  c. 04/2018 Cath/PCI: LCX 38m (3.25x15 [employer] DES); d. 02/2022 MV: high risk; e. 02/2022 Cath: LM nl, LAD 20p ISR, patent mid-stent, LCX patent stent, OM1 nl, OM2 50p, patent stent, OM3 40p, patent stent, RCA 40p ISR, 39m ISR, 55m/d, patent distal stent, RPDA patent stent, RPAV small, 80 (sl progression)-->Med Rx.   Deaf, left    Diabetes mellitus without complication ([place])    TYPE 2   Diabetic peripheral neuropathy ([place])    feet and hands   FUO (fever of unknown origin) 08/03/2018   GERD (gastroesophageal reflux disease)    Headache    muscle tension   Hyperlipidemia    Hypertension    Hyperthyroidism    Insomnia    Ischemic cardiomyopathy    a. 03/2018 Echo: EF 30-35%; b. 07/2018 Echo: EF  35-40%, Gr1 DD; c. 09/2019 TEE: EF 40-45%; d. 12/2020 Echo: EF 35-40%. GrI DD; e. 01/2022 Echo: EF 45-50%, no rwma, sev apical HK, nl RV fxn, Ao root 44mm.   Knee pain, acute 05/06/2020   Left arm weakness 10/04/2019   Left leg weakness 12/01/2019   Lyme disease    Chronic   Myasthenia gravis ([place])    (03/18/21 - no current treatment - "better than it has ever been" per pt)   Myocardial infarction ([place]) 2010   Palpitations    a. 10/2020 [facility]: RSR, 88 avg. 3 brief SVT episodes (max 5 beats @ 128). Rare PACs/PVCs. Triggered events did not correlate w/ significant arrthymia - some w/ sinus tach.   Seasonal allergies    Sepsis ([place])    a.07/2018 - unknown source. TEE neg for veg 09/2019.   Sleep apnea    CPAP   Wears hearing aid in both ears      Assessment and Plan: 1. Left leg weakness   2. DDD (degenerative disc disease), lumbar   3. Low back pain at multiple sites   4. Lumbar spondylosis with myelopathy   5. Chronic, continuous use of opioids   6. Chronic pain syndrome   7. Sciatica, left side   Based on our discussion today I think it is appropriate to refill his medicines for the next 2 months.  These will be sent in today for his oxycodone 4 times a day dosing.  We will schedule him for repeat epidural in about 1 month.  We have gone over the risks and benefits of the procedure previously and all of his questions were answered today.  I encouraged him to continue his stretching exercises as tolerated and continue Flexeril and gabapentin.  Continue follow-up with his primary care physician for baseline medical care with schedule rib turn appointment and epidural 1 month  Follow Up Instructions:    I discussed the assessment and treatment plan with the patient. The patient was provided an opportunity  to ask questions and all were answered. The patient agreed with the plan and demonstrated an understanding of the instructions.   The patient was advised to call back or seek an in-person  evaluation if the symptoms worsen or if the condition fails to improve as anticipated.  I provided 30 minutes of non-face-to-face time during this encounter.   Molli Barrows, MD

## 2022-03-26 ENCOUNTER — Ambulatory Visit: Payer: Medicare Other | Attending: Anesthesiology | Admitting: Anesthesiology

## 2022-03-26 DIAGNOSIS — F1190 Opioid use, unspecified, uncomplicated: Secondary | ICD-10-CM

## 2022-03-26 DIAGNOSIS — M545 Low back pain, unspecified: Secondary | ICD-10-CM

## 2022-03-26 DIAGNOSIS — R29898 Other symptoms and signs involving the musculoskeletal system: Secondary | ICD-10-CM | POA: Diagnosis not present

## 2022-03-26 DIAGNOSIS — M4716 Other spondylosis with myelopathy, lumbar region: Secondary | ICD-10-CM | POA: Diagnosis not present

## 2022-03-26 DIAGNOSIS — G894 Chronic pain syndrome: Secondary | ICD-10-CM

## 2022-03-26 DIAGNOSIS — M5136 Other intervertebral disc degeneration, lumbar region: Secondary | ICD-10-CM

## 2022-03-26 DIAGNOSIS — M5432 Sciatica, left side: Secondary | ICD-10-CM

## 2022-03-27 ENCOUNTER — Other Ambulatory Visit: Payer: Self-pay | Admitting: Cardiovascular Disease

## 2022-04-06 NOTE — Progress Notes (Deleted)
Name: [name]   MRN: [id number]    DOB: March 15, 1961   Date:04/06/2022       Progress Note  Subjective  Chief Complaint  Follow Up  HPI  [name] has been coughing since first week of March, he was seen by Dr. Raul Del and was given a round of antibiotics and prednisone, he felt better but cough never resolved. He saw Dr. Rosana Berger early May and was given another round of antibiotics . He is back and states tired of coughing, cough is productive at times, has SOB ( chronic and likely multifactorial) . Denies fever or chills. Appetite is normal   Reviewed notes and CT done 10/2021  Lungs/Pleura: Diffuse bilateral bronchial wall thickening. Scattered areas of ground-glass and heterogeneous airspace opacity throughout the bilateral lungs, for example in the medial right upper lobe (series 4, image 53) and in the left lower lobe (series 4, image 74). No pleural effusion or pneumothorax.  Spondyloarthropathy lumbar: chronic low back pain, under the care of Dr. [name], he has intermittent radiculitis down his legs, mostly down left lower leg. 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[place]) 12/02/2021   Gastropathy 08/13/2021   Unintentional weight loss 08/13/2021   Iron deficiency anemia due to chronic blood loss    Pain due to onychomycosis of toenails of both feet 11/11/2020   Lung involvement associated with another disorder ([place]) 05/17/2020   Left leg weakness 12/01/2019   Lower urinary tract symptoms 09/15/2019   Incomplete bladder emptying 09/15/2019   Carpal tunnel syndrome on both sides 07/12/2019   Weakness generalized 06/21/2019   Idiopathic peripheral neuropathy 04/21/2019   Sensory ataxia 03/09/2019   Colitis 07/08/2018   OSA on CPAP 07/08/2018   Congestive heart failure (CHF) ([place]) 05/05/2018    Unstable angina ([place]) 04/26/2018   Ischemic cardiomyopathy 04/26/2018   Chronic combined systolic and diastolic CHF (congestive heart failure) ([place]) 04/26/2018   Anginal equivalent ([place]) 04/25/2018   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[place]) 01/25/2018   Vitamin D deficiency, unspecified 08/12/2017   Ptosis of left eyelid 01/08/2017   Dermatitis 12/24/2016   Difficulty walking 04/27/2016   Foot cramps 04/27/2016   Benign neoplasm of sigmoid colon    Benign neoplasm of descending colon    Benign neoplasm of transverse colon    Coronary artery disease involving native coronary artery with angina pectoris ([place]) 10/22/2015   Coronary artery disease 08/22/2015   Myasthenia gravis ([place]) 08/22/2015   Chronic left-sided low back pain 08/22/2015   Hypertension 08/22/2015   GERD (gastroesophageal reflux disease) 08/22/2015   Hyperlipidemia 08/22/2015   Insomnia 08/22/2015   Type 2 diabetes mellitus with diabetic polyneuropathy, with long-term current use of insulin ([place]) 08/22/2015   Asthma 08/22/2015   Lyme disease 06/05/1992    Past Surgical History:  Procedure Laterality Date   BILATERAL CARPAL TUNNEL RELEASE Bilateral L in 2012 and R in 2013   [place], most recent in  March 2016.   COLONOSCOPY WITH PROPOFOL N/A 01/10/2016   Procedure: COLONOSCOPY WITH PROPOFOL;  Surgeon: [name], MD;  Location: [facility] ENDOSCOPY;  Service: Endoscopy;  Laterality: N/A;   COLONOSCOPY WITH PROPOFOL N/A 04/14/2021   Procedure: COLONOSCOPY WITH PROPOFOL;  Surgeon: [name], MD;  Location: [facility];  Service: Endoscopy;  Laterality: N/A;  Diabetic - insulin and oral meds   CORONARY ANGIOPLASTY     CORONARY STENT INTERVENTION N/A 04/25/2018   Procedure: CORONARY STENT INTERVENTION;  Surgeon: [name], MD;  Location: [place] [facility];  Service: Cardiovascular;  Laterality: N/A;   ESOPHAGOGASTRODUODENOSCOPY (EGD) WITH PROPOFOL N/A  01/10/2016   Procedure: ESOPHAGOGASTRODUODENOSCOPY (EGD) WITH PROPOFOL;  Surgeon: [name], MD;  Location: [facility] ENDOSCOPY;  Service: Endoscopy;  Laterality: N/A;   ESOPHAGOGASTRODUODENOSCOPY (EGD) WITH PROPOFOL N/A 04/14/2021   Procedure: ESOPHAGOGASTRODUODENOSCOPY (EGD) WITH BIOPSY;  Surgeon: [name], MD;  Location: [facility];  Service: Endoscopy;  Laterality: N/A;   EYE SURGERY Bilateral 2012   cataract/bilateral vitrectomies   GIVENS CAPSULE STUDY N/A 08/20/2021   Procedure: GIVENS CAPSULE STUDY;  Surgeon: [name], MD;  Location: [facility] ENDOSCOPY;  Service: Endoscopy;  Laterality: N/A;   LEFT HEART CATH AND CORONARY ANGIOGRAPHY Left 04/25/2018   Procedure: LEFT HEART CATH AND CORONARY ANGIOGRAPHY;  Surgeon: [name], MD;  Location: [place] [facility];  Service: Cardiovascular;  Laterality: Left;   LEFT HEART CATH AND CORONARY ANGIOGRAPHY Left 02/02/2022   Procedure: LEFT HEART CATH AND CORONARY ANGIOGRAPHY;  Surgeon: [name], MD;  Location: [place] [facility];  Service: Cardiovascular;  Laterality: Left;   TEE WITHOUT CARDIOVERSION N/A 09/05/2018   Procedure: TRANSESOPHAGEAL ECHOCARDIOGRAM (TEE);  Surgeon: [name], MD;  Location: [facility];  Service: Cardiovascular;  Laterality: N/A;   TONSILLECTOMY AND ADENOIDECTOMY     As a child   TUNNELED VENOUS CATHETER PLACEMENT     removed    Family History  Problem Relation Age of Onset   Diabetes Mother    Heart disease Mother    Cancer Father        Prostate CA, Anal cancer    Dementia Father    Diabetes Brother    Healthy Brother    Healthy Brother     Social History   Tobacco Use   Smoking status: Never   Smokeless tobacco: Never   Tobacco comments:    smoking cessation materials not required  Substance Use Topics   Alcohol use: Not Currently    Alcohol/week: 0.0 standard drinks of alcohol     Current Outpatient Medications:    albuterol (PROVENTIL) (2.5 MG/3ML) 0.083%  nebulizer solution, Take 3 mLs (2.5 mg total) by nebulization every 6 (six) hours as needed for wheezing or shortness of breath., Disp: 75 mL, Rfl: 2   albuterol (VENTOLIN HFA) 108 (90 Base) MCG/ACT inhaler, Inhale 2 puffs into the lungs daily., Disp: , Rfl:    ammonium lactate (AMLACTIN) 12 % lotion, Apply 1 application topically as needed for dry skin., Disp: 400 g, Rfl: 3   aspirin EC 81 MG tablet, Take 1 tablet (81 mg total) by mouth daily., Disp: 90 tablet, Rfl: 3   benzonatate (TESSALON) 100 MG capsule, Take 2 capsules (200 mg total) by mouth 3 (three) times daily as needed., Disp: 90 capsule, Rfl: 0   clopidogrel (PLAVIX) 75 MG tablet, TAKE 1 TABLET(75 MG) BY MOUTH DAILY WITH BREAKFAST, Disp: 30 tablet, Rfl: 0   cyclobenzaprine (FLEXERIL) 10 MG tablet, Take 10 mg by mouth 2 (two) times daily., Disp: , Rfl:    dexlansoprazole (DEXILANT) 60 MG capsule, Take 1 capsule (60 mg total) by mouth daily. (Patient taking differently: Take 1 capsule by mouth daily with supper.), Disp: 30 capsule, Rfl: 11   DULoxetine (CYMBALTA) 30 MG capsule, Take 1 capsule (30 mg total) by mouth daily., Disp:  90 capsule, Rfl: 1   DULoxetine (CYMBALTA) 60 MG capsule, Take 1 capsule (60 mg total) by mouth daily. (Patient taking differently: Take 60 mg by mouth at bedtime.), Disp: 90 capsule, Rfl: 1   ezetimibe (ZETIA) 10 MG tablet, TAKE 1 TABLET(10 MG) BY MOUTH DAILY, Disp: 90 tablet, Rfl: 0   furosemide (LASIX) 20 MG tablet, TAKE 1 TABLET BY MOUTH DAILY AS NEEDED FOR WEIGHT GAIN OF 2 POUNDS OVERNIGHT OR 5 POUNDS PER WEEK (Patient taking differently: Take 20 mg by mouth every other day.), Disp: 90 tablet, Rfl: 0   gabapentin (NEURONTIN) 300 MG capsule, Take 1 capsule (300 mg total) by mouth 3 (three) times daily., Disp: 90 capsule, Rfl: 1   gabapentin (NEURONTIN) 800 MG tablet, Take 800 mg by mouth at bedtime., Disp: , Rfl:    Humidifier/Vaporizer Supplies (VICKS VAPORIZER SCENT) PADS, Apply 1 application. topically daily as  needed (cough)., Disp: , Rfl:    insulin lispro (HUMALOG) 100 UNIT/ML KiwkPen, Inject 7-8 Units into the skin 3 (three) times daily. Reported on 12/02/2015/ sliding scale 1 unit for every 8 units of carbs; and 1 unit for every 20 above 120, Disp: , Rfl:    Insulin Pen Needle 32G X 4 MM MISC, Inject 1 each into the skin as needed., Disp: , Rfl:    JARDIANCE 10 MG TABS tablet, TAKE 1 TABLET(10 MG) BY MOUTH DAILY BEFORE BREAKFAST, Disp: 30 tablet, Rfl: 4   LANTUS SOLOSTAR 100 UNIT/ML Solostar Pen, Inject 20-26 Units into the skin daily at 10 pm. Sliding scale based on blood glucose, Disp: , Rfl: 0   levocetirizine (XYZAL) 5 MG tablet, Take 1 tablet (5 mg total) by mouth every evening., Disp: 90 tablet, Rfl: 0   levothyroxine (SYNTHROID) 50 MCG tablet, TAKE 1 TABLET BY MOUTH DAILY MONDAY THROUGH SATURDAY AND 2 TABLETS EVERY SUNDAY, TAKE BEFORE BREAKFAST(RECHECK LEVELS IN 6 WEEKS), Disp: 102 tablet, Rfl: 0   loratadine (CLARITIN) 10 MG tablet, Take 10 mg by mouth in the morning., Disp: , Rfl:    metFORMIN (GLUCOPHAGE) 1000 MG tablet, Take 1,000 mg by mouth 2 (two) times daily with a meal., Disp: , Rfl:    montelukast (SINGULAIR) 10 MG tablet, Take 10 mg by mouth at bedtime., Disp: , Rfl:    Multiple Vitamins-Minerals (MENS MULTI VITAMIN & MINERAL PO), Take 1 tablet by mouth in the morning and at bedtime., Disp: , Rfl:    naloxone (NARCAN) nasal spray 4 mg/0.1 mL, For excess sedation from opioids, Disp: 1 kit, Rfl: 2   nitroGLYCERIN (NITROSTAT) 0.3 MG SL tablet, DISSOLVE 1 TABLET UNDER THE TONGUE EVERY 5 MINUTES AS NEEDED FOR CHEST PAIN. MAXIMUM 3 DOSES., Disp: 100 tablet, Rfl: 3   Oxycodone HCl 10 MG TABS, Take 1 tablet (10 mg total) by mouth in the morning, at noon, in the evening, and at bedtime., Disp: 120 tablet, Rfl: 0   [START ON 04/28/2022] Oxycodone HCl 10 MG TABS, Take 1 tablet (10 mg total) by mouth every 6 (six) hours., Disp: 120 tablet, Rfl: 0   oxymetazoline (AFRIN) 0.05 % nasal spray, Place 1  spray into both nostrils at bedtime as needed for congestion., Disp: , Rfl:    OZEMPIC, 1 MG/DOSE, 4 MG/3ML SOPN, Inject 1 mg into the skin once a week., Disp: , Rfl:    predniSONE (DELTASONE) 10 MG tablet, Take 1-2 tablets (10-20 mg total) by mouth 2 (two) times daily with a meal., Disp: 30 tablet, Rfl: 0   rosuvastatin (CRESTOR) 5 MG  tablet, Take 5 mg by mouth daily with supper., Disp: , Rfl: 0  Current Facility-Administered Medications:    sodium chloride flush (NS) 0.9 % injection 3 mL, 3 mL, Intravenous, Q12H, Furth, Cadence H, PA-C  Allergies  Allergen Reactions   Azathioprine Other (See Comments)    Azathioprine hypersensitivity reaction - Symptoms mimicking sepsis - was hospitalized   Novolog [Insulin Aspart] Hives    I personally reviewed active problem list, medication list, allergies, family history, social history, health maintenance with the patient/caregiver today.   ROS  ***  Objective  There were no vitals filed for this visit.  There is no height or weight on file to calculate BMI.  Physical Exam ***  Recent Results (from the past 2160 hour(s))  B Nat Peptide     Status: None   Collection Time: 01/09/22 11:21 AM  Result Value Ref Range   BNP 60.6 0.0 - 100.0 pg/mL    Comment: Siemens ADVIA Centaur XP methodology  NM Myocar Multi W/Spect W/Wall Motion / EF     Status: None   Collection Time: 01/26/22 10:59 AM  Result Value Ref Range   ST Depression (mm) 0 mm   Percent HR 62.0 %   MPHR 158 bpm   Peak BP 97/55 mmHg   Peak HR 99 bpm   Estimated workload 1.0    Exercise duration (sec) 0 sec   Exercise duration (min) 0 min   Rest BP 107/67 mmHg   Rest HR 90.0 bpm   Rest Nuclear Isotope Dose 9.6 mCi   Stress Nuclear Isotope Dose 31.5 mCi   SSS 17.0    SRS 19.0    SDS 0.0    TID 1.31    LV sys vol 38.0 mL   LV dias vol 109.0 62 - 150 mL  CBC w/Diff     Status: None   Collection Time: 01/28/22  1:24 PM  Result Value Ref Range   WBC 6.2 4.0 - 10.5  K/uL   RBC 4.60 4.22 - 5.81 MIL/uL   Hemoglobin 13.5 13.0 - 17.0 g/dL   HCT 42.9 39.0 - 52.0 %   MCV 93.3 80.0 - 100.0 fL   MCH 29.3 26.0 - 34.0 pg   MCHC 31.5 30.0 - 36.0 g/dL   RDW 13.8 11.5 - 15.5 %   Platelets 198 150 - 400 K/uL   nRBC 0.0 0.0 - 0.2 %   Neutrophils Relative % 68 %   Neutro Abs 4.2 1.7 - 7.7 K/uL   Lymphocytes Relative 17 %   Lymphs Abs 1.1 0.7 - 4.0 K/uL   Monocytes Relative 9 %   Monocytes Absolute 0.5 0.1 - 1.0 K/uL   Eosinophils Relative 5 %   Eosinophils Absolute 0.3 0.0 - 0.5 K/uL   Basophils Relative 0 %   Basophils Absolute 0.0 0.0 - 0.1 K/uL   Immature Granulocytes 1 %   Abs Immature Granulocytes 0.03 0.00 - 0.07 K/uL    Comment: Performed at [facility], [address] [place], [place] [address]  Basic metabolic panel     Status: Abnormal   Collection Time: 01/28/22  1:24 PM  Result Value Ref Range   Sodium 137 135 - 145 mmol/L   Potassium 4.7 3.5 - 5.1 mmol/L   Chloride 101 98 - 111 mmol/L   CO2 29 22 - 32 mmol/L   Glucose, Bld 167 (H) 70 - 99 mg/dL    Comment: Glucose reference range applies only to samples taken after fasting  for at least 8 hours.   BUN 17 8 - 23 mg/dL   Creatinine, Ser 1.09 0.61 - 1.24 mg/dL   Calcium 9.4 8.9 - 10.3 mg/dL   GFR, Estimated >60 >60 mL/min    Comment: (NOTE) Calculated using the CKD-EPI Creatinine Equation (2021)    Anion gap 7 5 - 15    Comment: Performed at [facility], [address] [place], [place] [address]  ECHOCARDIOGRAM COMPLETE     Status: None   Collection Time: 01/28/22  2:21 PM  Result Value Ref Range   AR max vel 4.72 cm2   AV Peak grad 3.0 mmHg   Ao pk vel 0.86 m/s   AV Area VTI 4.71 cm2   AV Mean grad 2.0 mmHg   AV Area mean vel 4.57 cm2  Glucose, capillary     Status: Abnormal   Collection Time: 02/02/22  9:23 AM  Result Value Ref Range   Glucose-Capillary 149 (H) 70 - 99 mg/dL    Comment: Glucose reference range applies only to samples taken after fasting  for at least 8 hours.    Diabetic Foot Exam: Diabetic Foot Exam - Simple   No data filed    ***  PHQ2/9:    02/17/2022   11:26 AM 01/27/2022    9:39 AM 01/12/2022    2:58 PM 12/02/2021    1:57 PM 11/10/2021   12:50 PM  Depression screen PHQ 2/9  Decreased Interest 1 0 0 0 0  Down, Depressed, Hopeless 0 1 0 0 0  PHQ - 2 Score 1 1 0 0 0  Altered sleeping 0 0 0 0   Tired, decreased energy 1 1 0 0   Change in appetite 0 0 0 0   Feeling bad or failure about yourself  0 0 0 0   Trouble concentrating 0 0 0 0   Moving slowly or fidgety/restless 0 0 0 0   Suicidal thoughts 0 0 0 0   PHQ-9 Score 2 2 0 0   Difficult doing work/chores Not difficult at all Not difficult at all Not difficult at all Not difficult at all     phq 9 is {gen pos [id number]}   Fall Risk:    02/17/2022   11:25 AM 01/27/2022    9:39 AM 01/12/2022    2:58 PM 12/02/2021    1:57 PM 11/10/2021   12:50 PM  Fall Risk   Falls in the past year? $RemoveBe'1 1 1 1 1  'AumGZGcuW$ Number falls in past yr: $Remove'1 1 1 1 'ISGbDpU$ 0  Injury with Fall? $RemoveBe'1 1 1 'ySFlQlZQz$ 0 0  Risk for fall due to : History of fall(s) Impaired balance/gait;Impaired mobility Impaired balance/gait;Impaired mobility Impaired balance/gait   Follow up Falls prevention discussed;Education provided;Falls evaluation completed   Falls prevention discussed;Education provided       Functional Status Survey:      Assessment & Plan  *** There are no diagnoses linked to this encounter.

## 2022-04-08 ENCOUNTER — Ambulatory Visit: Payer: Medicare Other | Admitting: Family Medicine

## 2022-04-11 ENCOUNTER — Other Ambulatory Visit: Payer: Self-pay | Admitting: Medical

## 2022-04-17 ENCOUNTER — Ambulatory Visit (INDEPENDENT_AMBULATORY_CARE_PROVIDER_SITE_OTHER): Payer: Medicare Other | Admitting: Internal Medicine

## 2022-04-17 ENCOUNTER — Encounter: Payer: Self-pay | Admitting: Internal Medicine

## 2022-04-17 VITALS — BP 130/74 | HR 106 | Temp 97.0°F | Resp 16 | Ht 67.0 in | Wt 190.4 lb

## 2022-04-17 DIAGNOSIS — F331 Major depressive disorder, recurrent, moderate: Secondary | ICD-10-CM

## 2022-04-17 DIAGNOSIS — E063 Autoimmune thyroiditis: Secondary | ICD-10-CM

## 2022-04-17 DIAGNOSIS — E1142 Type 2 diabetes mellitus with diabetic polyneuropathy: Secondary | ICD-10-CM | POA: Diagnosis not present

## 2022-04-17 DIAGNOSIS — R634 Abnormal weight loss: Secondary | ICD-10-CM

## 2022-04-17 DIAGNOSIS — J4540 Moderate persistent asthma, uncomplicated: Secondary | ICD-10-CM | POA: Diagnosis not present

## 2022-04-17 DIAGNOSIS — R0981 Nasal congestion: Secondary | ICD-10-CM

## 2022-04-17 DIAGNOSIS — Z794 Long term (current) use of insulin: Secondary | ICD-10-CM

## 2022-04-17 NOTE — Patient Instructions (Signed)
It was great seeing you today!  Plan discussed at today's visit: -Continue all medications -Stop Afrin  -Start Ryaltris nasal spray - 2 sprays on each side twice a day. Can use nasal saline as well.   Follow up in: 3-6 months   Take care and let us know if you have any questions or concerns prior to your next visit.  Dr. Rosana Berger

## 2022-04-17 NOTE — Progress Notes (Signed)
Established Patient Office Visit  Subjective   Patient ID: KEL SENN, male    DOB: [birth year]  Age: 61 y.o. MRN: [id number]  Chief Complaint  Patient presents with   Follow-up    HPI  [name] is a 61 year old male here for follow up on chronic medical conditions.   Diabetes, Type 2: -Follows with Endocrinology, Dr. Honor Junes.  Note reviewed from 03/24/2022. -Last A1c 6/23 7.8% -Medications: Jardiance 10, Ozempic 1 mg, Metformin 1000 BID, Lantus 23-26 units, Humalog 8 units at meals.  -Patient is compliant with the above medications and reports no side effects.  -Checking BG at home: fasting sugar this morning was 89 -PNA vaccine: yes -Denies symptoms of hypoglycemia, polyuria, polydipsia, numbness extremities, foot ulcers/trauma.   Asthma:  -Following with Pulmonology, Dr. Raul Del.  Note reviewed from 04/08/2022. -Has been treated with multiple rounds of antibiotics and steroids for COPD exacerbation. -Today states that he had been doing better until he woke up yesterday with some nasal congestion. -Uses Afrin nearly daily  -Current Treatments: Symbicort, albuterol as needed -Satisfied with current treatment?: yes -Dyspnea frequency: no -Wheezing frequency: no -Cough frequency: Chronic, unchanged -Limitation of activity: no  Hypothyroidism: -Medications: Levothyroxine 50 mcg every day but 100 mcg on Sundays  -Patient is compliant with the above medication (s) at the above dose and reports no medication side effects.  -Denies weight changes, cold./heat intolerance, skin changes, anxiety/palpitations  -Last TSH: 12/22 2.18  Chronic Pain: -Following with pain management, Dr. [name].  Note reviewed from 03/26/2022. -Usually undergoes epidural injections and is on Oxycodone   IDA/Weight Loss: -Following with GI, has a history of gastritis; currently on Dexilant -Had capsule endoscopy but this test was inconclusive and has to be repeated  -Lost another 10 pounds in 2  months -Following with [facility], Dr. Tasia Catchings, note from 12/24/21 reviewed and labs from 12/22/21.  -CT A/P 11/01/21 negative for findings to explain weight loss   Myasthenia Gravis: -Had been following with Neurology, Dr. Westley Foots  -Not currently on medications, had been on Mestinon   Atherosclerosis of Aorta/CHF/OSA: -Follows with Cardiology.  Note reviewed from 02/25/2022. -Currently on Metoprolol 12.5 mg,  Lasix 20 mg changed to as needed basis.  Patient is currently euvolemic.  -s/p 11 cardiac stents in place, currently on Plavix.  Denies abnormal bleeding -Is compliant with CPAP  MDD: -Mood status: controlled -Current treatment: Cymbalta 30 mg in the am and 60 mg in the pm -Satisfied with current treatment?: yes -Symptom severity: severe  -Duration of current treatment : chronic -Side effects: no Medication compliance: excellent compliance Depression symptoms slightly worse today, as patient is upset with Lexi scan results.    04/17/2022    1:45 PM 04/17/2022    1:37 PM 02/17/2022   11:26 AM 01/27/2022    9:39 AM 01/12/2022    2:58 PM  Depression screen PHQ 2/9  Decreased Interest 0 0 1 0 0  Down, Depressed, Hopeless 2 2 0 1 0  PHQ - 2 Score _0 0  Altered sleeping 3 0 0 0 0  Tired, decreased energy 0 0 1 1 0  Change in appetite 2 0 0 0 0  Feeling bad or failure about yourself  0 0 0 0 0  Trouble concentrating 0 0 0 0 0  Moving slowly or fidgety/restless 0 0 0 0 0  Suicidal thoughts 0 0 0 0 0  PHQ-9 Score _1 0  Difficult doing work/chores Somewhat difficult  Not difficult at all Not difficult at all Not difficult at all Not difficult at all    Review of Systems  Constitutional:  Negative for chills and fever.  HENT:  Positive for congestion. Negative for ear pain, sinus pain and sore throat.   Respiratory:  Negative for cough, shortness of breath and wheezing.   Cardiovascular:  Negative for chest pain.      Objective:     BP 130/74   Pulse (!) 106   Temp (!)  97 F (36.1 C)   Resp 16   Ht 5' 7" (1.702 m)   Wt 190 lb 6.4 oz (86.4 kg)   SpO2 97%   BMI 29.82 kg/m  BP Readings from Last 3 Encounters:  04/17/22 130/74  02/25/22 112/70  02/17/22 128/80   Wt Readings from Last 3 Encounters:  04/17/22 190 lb 6.4 oz (86.4 kg)  02/25/22 193 lb (87.5 kg)  02/17/22 200 lb (90.7 kg)     Physical Exam Constitutional:      Appearance: Normal appearance.  HENT:     Head: Normocephalic and atraumatic.     Nose: Nose normal.     Mouth/Throat:     Mouth: Mucous membranes are moist.     Pharynx: Oropharynx is clear.  Eyes:     Conjunctiva/sclera: Conjunctivae normal.  Cardiovascular:     Rate and Rhythm: Normal rate and regular rhythm.  Pulmonary:     Effort: Pulmonary effort is normal.     Breath sounds: Normal breath sounds.  Skin:    General: Skin is warm and dry.  Neurological:     General: No focal deficit present.     Mental Status: He is alert. Mental status is at baseline.  Psychiatric:        Mood and Affect: Mood normal.        Behavior: Behavior normal.      No results found for any visits on 04/17/22.  Last CBC Lab Results  Component Value Date   WBC 6.2 01/28/2022   HGB 13.5 01/28/2022   HCT 42.9 01/28/2022   MCV 93.3 01/28/2022   MCH 29.3 01/28/2022   RDW 13.8 01/28/2022   PLT 198 54/65/6812   Last metabolic panel Lab Results  Component Value Date   GLUCOSE 167 (H) 01/28/2022   NA 137 01/28/2022   K 4.7 01/28/2022   CL 101 01/28/2022   CO2 29 01/28/2022   BUN 17 01/28/2022   CREATININE 1.09 01/28/2022   EGFR 65 12/10/2021   CALCIUM 9.4 01/28/2022   PROT 6.1 09/23/2021   ALBUMIN 4.2 07/17/2021   LABGLOB 2.0 07/17/2021   AGRATIO 2.1 07/17/2021   BILITOT 0.4 09/23/2021   ALKPHOS 99 07/17/2021   AST 18 09/23/2021   ALT 21 09/23/2021   ANIONGAP 7 01/28/2022   Last lipids Lab Results  Component Value Date   CHOL 92 (L) 07/17/2021   HDL 28 (L) 07/17/2021   LDLCALC 39 07/17/2021   LDLDIRECT 36  07/17/2021   TRIG 145 07/17/2021   CHOLHDL 3.3 07/17/2021   Last hemoglobin A1c Lab Results  Component Value Date   HGBA1C 7.8 03/24/2022   Last thyroid functions Lab Results  Component Value Date   TSH 2.18 09/23/2021   Last vitamin D Lab Results  Component Value Date   VD25OH 72 10/16/2019   Last vitamin B12 and Folate Lab Results  Component Value Date   VITAMINB12 469 04/02/2021   FOLATE 14.4 04/02/2021      The  ASCVD Risk score (Arnett DK, et al., 2019) failed to calculate for the following reasons:   The valid total cholesterol range is 130 to 320 mg/dL    Assessment & Plan:   1. Type 2 diabetes mellitus with diabetic polyneuropathy, with long-term current use of insulin ([place]): Following with Endocrinology, note reviewed from 03/24/2022. Last A1c 7.8%.  2. Moderate persistent asthma, unspecified whether complicated/Nasal congestion: Breathing stable, currently on Albuterol as needed. Following with Pulmonology, note from 04/08/22 reviewed. Woke up with nasal congestion yesterday but has been using Afrin daily for some time. Stop Afrin, given sample of Ryaltris to use 2 sprays on each side twice a day while having symptoms.   3. Hypothyroidism, acquired, autoimmune: Stable, continue Levothyroxine 50 mcg daily.   4. Moderate episode of recurrent major depressive disorder ([place]): Stable, currently on Cymbalta 30 mg in the am and 60 in the pm.   5. Weight decrease: Weight 190 pounds today but lost 10 pounds in 2 months, 75 pounds total. Following with Oncology, work up thus far negative. Labs reviewed from 12/22/21, nothing obvious that would cause weight loss. Appetite down, eats 2 meals about 90% of plate at each meal. Starting Ensure protein low sugar soon because he is having his teeth pulled soon which will make eating even more difficult. Continue to monitor.   Return in about 3 months (around 07/18/2022).    Teodora Medici, DO

## 2022-04-30 ENCOUNTER — Telehealth: Payer: Self-pay | Admitting: Family Medicine

## 2022-04-30 NOTE — Telephone Encounter (Signed)
Copied from [place]. Topic: Referral - Status >> Apr 30, 2022  1:30 PM Ja-Kwan M wrote: Reason for CRM: Pt requests referral be sent to [facility] in [place] regarding disability evaluation

## 2022-05-04 ENCOUNTER — Other Ambulatory Visit: Payer: Self-pay

## 2022-05-04 DIAGNOSIS — M4697 Unspecified inflammatory spondylopathy, lumbosacral region: Secondary | ICD-10-CM

## 2022-05-04 DIAGNOSIS — Q742 Other congenital malformations of lower limb(s), including pelvic girdle: Secondary | ICD-10-CM

## 2022-05-04 DIAGNOSIS — M25561 Pain in right knee: Secondary | ICD-10-CM

## 2022-05-04 NOTE — Telephone Encounter (Signed)
completed

## 2022-05-06 ENCOUNTER — Ambulatory Visit: Payer: Medicare Other | Admitting: Anesthesiology

## 2022-05-07 ENCOUNTER — Encounter: Payer: Self-pay | Admitting: Podiatry

## 2022-05-07 ENCOUNTER — Ambulatory Visit: Payer: Medicare Other | Admitting: Podiatry

## 2022-05-07 DIAGNOSIS — M79674 Pain in right toe(s): Secondary | ICD-10-CM

## 2022-05-07 DIAGNOSIS — B351 Tinea unguium: Secondary | ICD-10-CM

## 2022-05-07 DIAGNOSIS — E1142 Type 2 diabetes mellitus with diabetic polyneuropathy: Secondary | ICD-10-CM | POA: Diagnosis not present

## 2022-05-07 DIAGNOSIS — M79675 Pain in left toe(s): Secondary | ICD-10-CM

## 2022-05-07 NOTE — Progress Notes (Signed)
This patient presents  to my office for at risk foot care.  This patient requires this care by a professional since this patient will be at risk due to having diabetic neuropathy.  This patient is unable to cut nails himself since the patient cannot reach his nails.These nails are painful walking and wearing shoes.  This patient presents for at risk foot care today.  General Appearance  Alert, conversant and in no acute stress.  Vascular  Dorsalis pedis and posterior tibial  pulses are palpable  bilaterally.  Capillary return is within normal limits  bilaterally. Temperature is within normal limits  bilaterally.  Neurologic  Senn-Weinstein monofilament wire test absent   bilaterally. Muscle power within normal limits bilaterally.  Nails Thick disfigured discolored nails with subungual debris  Hallux nails bilaterally. No evidence of bacterial infection or drainage bilaterally.  Orthopedic  No limitations of motion  feet .  No crepitus or effusions noted.  No bony pathology or digital deformities noted.  Mild  MTA.  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  Skin   No porokeratosis noted bilaterally.  No signs of infections or ulcers noted.  Dry peeling skin plantarly both feet.   Onychomycosis Hallux    Pain in right toes  Pain in left toes  Diabetic neuropathy.  Consent was obtained for treatment procedures.   Mechanical debridement of nails 1-5  bilaterally performed with a nail nipper.  Filed with dremel without incident. Prescribe amlatin.     Return office visit   10 weeks                 Told patient to return for periodic foot care and evaluation due to potential at risk complications.   Gardiner Barefoot DPM

## 2022-05-11 ENCOUNTER — Encounter: Payer: Self-pay | Admitting: Family Medicine

## 2022-05-11 ENCOUNTER — Ambulatory Visit (HOSPITAL_BASED_OUTPATIENT_CLINIC_OR_DEPARTMENT_OTHER): Payer: Medicare Other | Admitting: Anesthesiology

## 2022-05-11 ENCOUNTER — Encounter: Payer: Self-pay | Admitting: Anesthesiology

## 2022-05-11 ENCOUNTER — Ambulatory Visit
Admission: RE | Admit: 2022-05-11 | Discharge: 2022-05-11 | Disposition: A | Discharge status: Home / Self Care | Payer: Medicare Other | Source: Ambulatory Visit | Attending: Anesthesiology | Admitting: Anesthesiology

## 2022-05-11 ENCOUNTER — Other Ambulatory Visit: Payer: Self-pay | Admitting: Anesthesiology

## 2022-05-11 VITALS — BP 109/74 | HR 98 | Temp 97.3°F | Resp 18 | Ht 67.0 in | Wt 185.0 lb

## 2022-05-11 DIAGNOSIS — M542 Cervicalgia: Secondary | ICD-10-CM | POA: Insufficient documentation

## 2022-05-11 DIAGNOSIS — R29898 Other symptoms and signs involving the musculoskeletal system: Secondary | ICD-10-CM

## 2022-05-11 DIAGNOSIS — Z79891 Long term (current) use of opiate analgesic: Secondary | ICD-10-CM | POA: Diagnosis not present

## 2022-05-11 DIAGNOSIS — M4716 Other spondylosis with myelopathy, lumbar region: Secondary | ICD-10-CM | POA: Diagnosis not present

## 2022-05-11 DIAGNOSIS — G894 Chronic pain syndrome: Secondary | ICD-10-CM | POA: Insufficient documentation

## 2022-05-11 DIAGNOSIS — R52 Pain, unspecified: Secondary | ICD-10-CM | POA: Insufficient documentation

## 2022-05-11 DIAGNOSIS — R531 Weakness: Secondary | ICD-10-CM | POA: Insufficient documentation

## 2022-05-11 DIAGNOSIS — M47817 Spondylosis without myelopathy or radiculopathy, lumbosacral region: Secondary | ICD-10-CM

## 2022-05-11 DIAGNOSIS — F1190 Opioid use, unspecified, uncomplicated: Secondary | ICD-10-CM | POA: Diagnosis present

## 2022-05-11 DIAGNOSIS — M5136 Other intervertebral disc degeneration, lumbar region: Secondary | ICD-10-CM | POA: Diagnosis present

## 2022-05-11 DIAGNOSIS — M5442 Lumbago with sciatica, left side: Secondary | ICD-10-CM | POA: Diagnosis not present

## 2022-05-11 DIAGNOSIS — M5432 Sciatica, left side: Secondary | ICD-10-CM | POA: Insufficient documentation

## 2022-05-11 MED ORDER — LIDOCAINE HCL (PF) 1 % IJ SOLN
INTRAMUSCULAR | Status: AC
  Filled 2022-05-11: qty 10

## 2022-05-11 MED ORDER — ROPIVACAINE HCL 2 MG/ML IJ SOLN
10.0000 mL | Freq: Once | INTRAMUSCULAR | Status: AC
  Administered 2022-05-11: 1 mL via EPIDURAL

## 2022-05-11 MED ORDER — ROPIVACAINE HCL 2 MG/ML IJ SOLN
INTRAMUSCULAR | Status: AC
  Filled 2022-05-11: qty 20

## 2022-05-11 MED ORDER — IOHEXOL 180 MG/ML  SOLN
INTRAMUSCULAR | Status: AC
  Filled 2022-05-11: qty 20

## 2022-05-11 MED ORDER — SODIUM CHLORIDE 0.9% FLUSH
10.0000 mL | Freq: Once | INTRAVENOUS | Status: AC
  Administered 2022-05-11: 10 mL

## 2022-05-11 MED ORDER — IOPAMIDOL (ISOVUE-M 200) INJECTION 41%: 20.0000 mL | Freq: Once | INTRAMUSCULAR | Status: DC | PRN

## 2022-05-11 MED ORDER — TRIAMCINOLONE ACETONIDE 40 MG/ML IJ SUSP
INTRAMUSCULAR | Status: AC
  Filled 2022-05-11: qty 1

## 2022-05-11 MED ORDER — TRIAMCINOLONE ACETONIDE 40 MG/ML IJ SUSP
40.0000 mg | Freq: Once | INTRAMUSCULAR | Status: AC
  Administered 2022-05-11: 40 mg

## 2022-05-11 MED ORDER — LIDOCAINE HCL (PF) 1 % IJ SOLN
5.0000 mL | Freq: Once | INTRAMUSCULAR | Status: AC
  Administered 2022-05-11: 5 mL via SUBCUTANEOUS

## 2022-05-11 MED ORDER — SODIUM CHLORIDE (PF) 0.9 % IJ SOLN
INTRAMUSCULAR | Status: AC
Start: 2022-05-11 — End: ?
  Filled 2022-05-11: qty 10

## 2022-05-11 NOTE — Patient Instructions (Signed)
Pain Management Discharge Instructions  General Discharge Instructions :  If you need to reach your doctor call: Monday-Friday 8:00 am - 4:00 pm at [phone] or toll free [phone].  After clinic hours [phone] to have operator reach doctor.  Bring all of your medication bottles to all your appointments in the pain clinic.  To cancel or reschedule your appointment with Pain Management please remember to call 24 hours in advance to avoid a fee.  Refer to the educational materials which you have been given on: General Risks, I had my Procedure. Discharge Instructions, Post Sedation.  Post Procedure Instructions:  The drugs you were given will stay in your system until tomorrow, so for the next 24 hours you should not drive, make any legal decisions or drink any alcoholic beverages.  You may eat anything you prefer, but it is better to start with liquids then soups and crackers, and gradually work up to solid foods.  Please notify your doctor immediately if you have any unusual bleeding, trouble breathing or pain that is not related to your normal pain.  Depending on the type of procedure that was done, some parts of your body may feel week and/or numb.  This usually clears up by tonight or the next day.  Walk with the use of an assistive device or accompanied by an adult for the 24 hours.  You may use ice on the affected area for the first 24 hours.  Put ice in a Ziploc bag and cover with a towel and place against area 15 minutes on 15 minutes off.  You may switch to heat after 24 hours.Epidural Steroid Injection Patient Information  Description: The epidural space surrounds the nerves as they exit the spinal cord.  In some patients, the nerves can be compressed and inflamed by a bulging disc or a tight spinal canal (spinal stenosis).  By injecting steroids into the epidural space, we can bring irritated nerves into direct contact with a potentially helpful medication.  These  steroids act directly on the irritated nerves and can reduce swelling and inflammation which often leads to decreased pain.  Epidural steroids may be injected anywhere along the spine and from the neck to the low back depending upon the location of your pain.   After numbing the skin with local anesthetic (like Novocaine), a small needle is passed into the epidural space slowly.  You may experience a sensation of pressure while this is being done.  The entire block usually last less than 10 minutes.  Conditions which may be treated by epidural steroids:  Low back and leg pain Neck and arm pain Spinal stenosis Post-laminectomy syndrome Herpes zoster (shingles) pain Pain from compression fractures  Preparation for the injection:  Do not eat any solid food or dairy products within 8 hours of your appointment.  You may drink clear liquids up to 3 hours before appointment.  Clear liquids include water, black coffee, juice or soda.  No milk or cream please. You may take your regular medication, including pain medications, with a sip of water before your appointment  Diabetics should hold regular insulin (if taken separately) and take 1/2 normal NPH dos the morning of the procedure.  Carry some sugar containing items with you to your appointment. A driver must accompany you and be prepared to drive you home after your procedure.  Bring all your current medications with your. An IV may be inserted and sedation may be given at the discretion of the physician.     A blood pressure cuff, EKG and other monitors will often be applied during the procedure.  Some patients may need to have extra oxygen administered for a short period. You will be asked to provide medical information, including your allergies, prior to the procedure.  We must know immediately if you are taking blood thinners (like Coumadin/Warfarin)  Or if you are allergic to IV iodine contrast (dye). We must know if you could possible be  pregnant.  Possible side-effects: Bleeding from needle site Infection (rare, may require surgery) Nerve injury (rare) Numbness & tingling (temporary) Difficulty urinating (rare, temporary) Spinal headache ( a headache worse with upright posture) Light -headedness (temporary) Pain at injection site (several days) Decreased blood pressure (temporary) Weakness in arm/leg (temporary) Pressure sensation in back/neck (temporary)  Call if you experience: Fever/chills associated with headache or increased back/neck pain. Headache worsened by an upright position. New onset weakness or numbness of an extremity below the injection site Hives or difficulty breathing (go to the emergency room) Inflammation or drainage at the infection site Severe back/neck pain Any new symptoms which are concerning to you  Please note:  Although the local anesthetic injected can often make your back or neck feel good for several hours after the injection, the pain will likely return.  It takes 3-7 days for steroids to work in the epidural space.  You may not notice any pain relief for at least that one week.  If effective, we will often do a series of three injections spaced 3-6 weeks apart to maximally decrease your pain.  After the initial series, we generally will wait several months before considering a repeat injection of the same type.  If you have any questions, please call [phone] [facility] 

## 2022-05-11 NOTE — Progress Notes (Unsigned)
Nursing Pain Medication Assessment:  Safety precautions to be maintained throughout the outpatient stay will include: orient to surroundings, keep bed in low position, maintain call bell within reach at all times, provide assistance with transfer out of bed and ambulation.  Medication Inspection Compliance: Pill count conducted under aseptic conditions, in front of the patient. Neither the pills nor the bottle was removed from the patient's sight at any time. Once count was completed pills were immediately returned to the patient in their original bottle.  Medication:  oxycodone 10 mg Pill/Patch Count:  70 of 120 pills remain Pill/Patch Appearance: Markings consistent with prescribed medication Bottle Appearance: Standard pharmacy container. Clearly labeled. Filled Date: 07 / 28 / 2023 Last Medication intake:  Today

## 2022-05-12 ENCOUNTER — Telehealth: Payer: Self-pay | Admitting: *Deleted

## 2022-05-12 MED ORDER — OXYCODONE HCL 10 MG PO TABS: 10.0000 mg | ORAL_TABLET | Freq: Four times a day (QID) | ORAL | 0 refills | Status: DC

## 2022-05-12 NOTE — Progress Notes (Signed)
Subjective:  Patient ID: [name], male    DOB: 1961/05/10  Age: 61 y.o. MRN: [id number]  CC: Back Pain   Procedure: L5-S1 epidural steroid and fluoroscopic guidance with no sedation  HPI [name] presents for reevaluation.  [name] continues to have recurrent sciatica symptoms and gets epidurals approximately every 3 to 4 months he reports good success with his last epidural back in February and has now began to have similar complaints to previous.  He generally gets 75 to 100% relief of sciatica symptoms and 75% improvement in his low back.  He continues to have diffuse body pain and is taking his medications as prescribed and gets good relief with these.  No side effects are reported with the oxycodone.  The quality characteristic and distribution of his low back pain are noted and stable in nature.  He does get some giveaway weakness on the left side with chronic complaints of lower extremity weakness at baseline.  Bowel and bladder function been stable and no other changes are noted.  He desires to proceed with a repeat epidural today.  He feels that this does make his sensory symptoms improve following injection.  Outpatient Medications Prior to Visit  Medication Sig Dispense Refill   albuterol (PROVENTIL) (2.5 MG/3ML) 0.083% nebulizer solution Take 3 mLs (2.5 mg total) by nebulization every 6 (six) hours as needed for wheezing or shortness of breath. 75 mL 2   albuterol (VENTOLIN HFA) 108 (90 Base) MCG/ACT inhaler Inhale 2 puffs into the lungs daily.     ammonium lactate (AMLACTIN) 12 % lotion Apply 1 application topically as needed for dry skin. 400 g 3   aspirin EC 81 MG tablet Take 1 tablet (81 mg total) by mouth daily. 90 tablet 3   cyclobenzaprine (FLEXERIL) 10 MG tablet Take 10 mg by mouth 2 (two) times daily.     dexlansoprazole (DEXILANT) 60 MG capsule Take 1 capsule (60 mg total) by mouth daily. (Patient taking differently: Take 1 capsule by mouth daily with supper.)  30 capsule 11   DULoxetine (CYMBALTA) 30 MG capsule Take 1 capsule (30 mg total) by mouth daily. 90 capsule 1   DULoxetine (CYMBALTA) 60 MG capsule Take 1 capsule (60 mg total) by mouth daily. (Patient taking differently: Take 60 mg by mouth at bedtime.) 90 capsule 1   ezetimibe (ZETIA) 10 MG tablet TAKE 1 TABLET(10 MG) BY MOUTH DAILY 90 tablet 0   furosemide (LASIX) 20 MG tablet TAKE 1 TABLET BY MOUTH DAILY AS NEEDED FOR WEIGHT GAIN OF 2 POUNDS OVERNIGHT OR 5 POUNDS PER WEEK 90 tablet 0   gabapentin (NEURONTIN) 300 MG capsule Take 1 capsule (300 mg total) by mouth 3 (three) times daily. 90 capsule 1   gabapentin (NEURONTIN) 800 MG tablet Take 800 mg by mouth at bedtime.     Humidifier/Vaporizer Supplies (VICKS VAPORIZER SCENT) PADS Apply 1 application. topically daily as needed (cough).     insulin lispro (HUMALOG) 100 UNIT/ML KiwkPen Inject 7-8 Units into the skin 3 (three) times daily. Reported on 12/02/2015/ sliding scale 1 unit for every 8 units of carbs; and 1 unit for every 20 above 120     Insulin Pen Needle 32G X 4 MM MISC Inject 1 each into the skin as needed.     JARDIANCE 10 MG TABS tablet TAKE 1 TABLET(10 MG) BY MOUTH DAILY BEFORE BREAKFAST 30 tablet 4   LANTUS SOLOSTAR 100 UNIT/ML Solostar Pen Inject 20-26 Units into the skin daily at  10 pm. Sliding scale based on blood glucose  0   levocetirizine (XYZAL) 5 MG tablet Take 1 tablet (5 mg total) by mouth every evening. 90 tablet 0   levothyroxine (SYNTHROID) 50 MCG tablet TAKE 1 TABLET BY MOUTH DAILY MONDAY THROUGH SATURDAY AND 2 TABLETS EVERY SUNDAY, TAKE BEFORE BREAKFAST(RECHECK LEVELS IN 6 WEEKS) 102 tablet 0   loratadine (CLARITIN) 10 MG tablet Take 10 mg by mouth in the morning.     metFORMIN (GLUCOPHAGE) 1000 MG tablet Take 1,000 mg by mouth 2 (two) times daily with a meal.     montelukast (SINGULAIR) 10 MG tablet Take 10 mg by mouth at bedtime.     Multiple Vitamins-Minerals (MENS MULTI VITAMIN & MINERAL PO) Take 1 tablet by mouth  in the morning and at bedtime.     naloxone (NARCAN) nasal spray 4 mg/0.1 mL For excess sedation from opioids 1 kit 2   nitroGLYCERIN (NITROSTAT) 0.3 MG SL tablet DISSOLVE 1 TABLET UNDER THE TONGUE EVERY 5 MINUTES AS NEEDED FOR CHEST PAIN. MAXIMUM 3 DOSES. 100 tablet 3   Oxycodone HCl 10 MG TABS Take 1 tablet (10 mg total) by mouth every 6 (six) hours. 120 tablet 0   oxymetazoline (AFRIN) 0.05 % nasal spray Place 1 spray into both nostrils at bedtime as needed for congestion.     OZEMPIC, 1 MG/DOSE, 4 MG/3ML SOPN Inject 1 mg into the skin once a week.     rosuvastatin (CRESTOR) 5 MG tablet Take 5 mg by mouth daily with supper.  0   clopidogrel (PLAVIX) 75 MG tablet TAKE 1 TABLET(75 MG) BY MOUTH DAILY WITH BREAKFAST (Patient not taking: Reported on 05/11/2022) 30 tablet 0   predniSONE (DELTASONE) 10 MG tablet Take 1-2 tablets (10-20 mg total) by mouth 2 (two) times daily with a meal. (Patient not taking: Reported on 05/11/2022) 30 tablet 0   Facility-Administered Medications Prior to Visit  Medication Dose Route Frequency Provider Last Rate Last Admin   sodium chloride flush (NS) 0.9 % injection 3 mL  3 mL Intravenous Q12H Furth, Cadence H, PA-C        Review of Systems CNS: No confusion or sedation Cardiac: No angina or palpitations GI: No abdominal pain or constipation Constitutional: No nausea vomiting fevers or chills  Objective:  BP 109/74   Pulse 98   Temp (!) 97.3 F (36.3 C) (Temporal)   Resp 18   Ht 5\' 7"  (1.702 m)   Wt 185 lb (83.9 kg)   SpO2 96%   BMI 28.98 kg/m    BP Readings from Last 3 Encounters:  05/11/22 109/74  04/17/22 130/74  02/25/22 112/70     Wt Readings from Last 3 Encounters:  05/11/22 185 lb (83.9 kg)  04/17/22 190 lb 6.4 oz (86.4 kg)  02/25/22 193 lb (87.5 kg)     Physical Exam Pt is alert and oriented PERRL EOMI HEART IS RRR no murmur or rub LCTA no wheezing or rales MUSCULOSKELETAL reveals some paraspinous muscle tenderness but no overt  trigger points.  He ambulates with an antalgic gait.  Muscle tone and bulk is at baseline.  Labs  Lab Results  Component Value Date   HGBA1C 7.8 03/24/2022   HGBA1C 7.6 (A) 10/21/2021   HGBA1C 8.3 06/04/2021   Lab Results  Component Value Date   MICROALBUR 1.4 05/05/2019   LDLCALC 39 07/17/2021   CREATININE 1.09 01/28/2022    -------------------------------------------------------------------------------------------------------------------- Lab Results  Component Value Date   WBC 6.2 01/28/2022  HGB 13.5 01/28/2022   HCT 42.9 01/28/2022   PLT 198 01/28/2022   GLUCOSE 167 (H) 01/28/2022   CHOL 92 (L) 07/17/2021   TRIG 145 07/17/2021   HDL 28 (L) 07/17/2021   LDLDIRECT 36 07/17/2021   LDLCALC 39 07/17/2021   ALT 21 09/23/2021   AST 18 09/23/2021   NA 137 01/28/2022   K 4.7 01/28/2022   CL 101 01/28/2022   CREATININE 1.09 01/28/2022   BUN 17 01/28/2022   CO2 29 01/28/2022   TSH 2.18 09/23/2021   PSA 0.56 09/23/2021   INR 1.26 07/07/2018   HGBA1C 7.8 03/24/2022   MICROALBUR 1.4 05/05/2019    --------------------------------------------------------------------------------------------------------------------- DG PAIN CLINIC C-ARM 1-60 MIN NO REPORT  Result Date: 05/11/2022 Fluoro was used, but no Radiologist interpretation will be provided. Please refer to "NOTES" tab for provider progress note.    Assessment & Plan:   Edmond was seen today for back pain.  Diagnoses and all orders for this visit:  Left leg weakness  DDD (degenerative disc disease), lumbar -     Lumbar Epidural Injection -     triamcinolone acetonide (KENALOG-40) injection 40 mg -     sodium chloride flush (NS) 0.9 % injection 10 mL -     ropivacaine (PF) 2 mg/mL (0.2%) (NAROPIN) injection 10 mL -     lidocaine (PF) (XYLOCAINE) 1 % injection 5 mL -     iopamidol (ISOVUE-M) 41 % intrathecal injection 20 mL -     ToxASSURE Select 13 (MW), Urine  Lumbar spondylosis with myelopathy -      Lumbar Epidural Injection  Sciatica, left side -     Lumbar Epidural Injection -     triamcinolone acetonide (KENALOG-40) injection 40 mg -     sodium chloride flush (NS) 0.9 % injection 10 mL -     ropivacaine (PF) 2 mg/mL (0.2%) (NAROPIN) injection 10 mL -     lidocaine (PF) (XYLOCAINE) 1 % injection 5 mL -     iopamidol (ISOVUE-M) 41 % intrathecal injection 20 mL -     ToxASSURE Select 13 (MW), Urine  Chronic, continuous use of opioids  Chronic pain syndrome  Cervicalgia  Facet arthritis of lumbosacral region        ----------------------------------------------------------------------------------------------------------------------  Problem List Items Addressed This Visit       Unprioritized   Left leg weakness - Primary   Other Visit Diagnoses     DDD (degenerative disc disease), lumbar       Relevant Medications   triamcinolone acetonide (KENALOG-40) injection 40 mg (Completed)   sodium chloride flush (NS) 0.9 % injection 10 mL (Completed)   ropivacaine (PF) 2 mg/mL (0.2%) (NAROPIN) injection 10 mL (Completed)   lidocaine (PF) (XYLOCAINE) 1 % injection 5 mL (Completed)   iopamidol (ISOVUE-M) 41 % intrathecal injection 20 mL   Other Relevant Orders   ToxASSURE Select 13 (MW), Urine   Lumbar spondylosis with myelopathy       Relevant Medications   triamcinolone acetonide (KENALOG-40) injection 40 mg (Completed)   Sciatica, left side       Relevant Medications   triamcinolone acetonide (KENALOG-40) injection 40 mg (Completed)   sodium chloride flush (NS) 0.9 % injection 10 mL (Completed)   ropivacaine (PF) 2 mg/mL (0.2%) (NAROPIN) injection 10 mL (Completed)   lidocaine (PF) (XYLOCAINE) 1 % injection 5 mL (Completed)   iopamidol (ISOVUE-M) 41 % intrathecal injection 20 mL   Other Relevant Orders   ToxASSURE Select 13 (MW), Urine  Chronic, continuous use of opioids       Chronic pain syndrome       Relevant Medications   triamcinolone acetonide (KENALOG-40)  injection 40 mg (Completed)   ropivacaine (PF) 2 mg/mL (0.2%) (NAROPIN) injection 10 mL (Completed)   lidocaine (PF) (XYLOCAINE) 1 % injection 5 mL (Completed)   Cervicalgia       Facet arthritis of lumbosacral region       Relevant Medications   triamcinolone acetonide (KENALOG-40) injection 40 mg (Completed)         ----------------------------------------------------------------------------------------------------------------------  1. DDD (degenerative disc disease), lumbar We will proceed with a repeat epidural today.  The risks and benefits are once again reviewed all questions answered we will schedule him for return to clinic in 2 months.  Continue core stretching strengthening exercises and physical therapy home regimen.  Continue current medication management as well. - Lumbar Epidural Injection - triamcinolone acetonide (KENALOG-40) injection 40 mg - sodium chloride flush (NS) 0.9 % injection 10 mL - ropivacaine (PF) 2 mg/mL (0.2%) (NAROPIN) injection 10 mL - lidocaine (PF) (XYLOCAINE) 1 % injection 5 mL - iopamidol (ISOVUE-M) 41 % intrathecal injection 20 mL - ToxASSURE Select 13 (MW), Urine  2. Lumbar spondylosis with myelopathy As above - Lumbar Epidural Injection  3. Sciatica, left side Above - Lumbar Epidural Injection - triamcinolone acetonide (KENALOG-40) injection 40 mg - sodium chloride flush (NS) 0.9 % injection 10 mL - ropivacaine (PF) 2 mg/mL (0.2%) (NAROPIN) injection 10 mL - lidocaine (PF) (XYLOCAINE) 1 % injection 5 mL - iopamidol (ISOVUE-M) 41 % intrathecal injection 20 mL - ToxASSURE Select 13 (MW), Urine  4. Left leg weakness Stable in nature.  Should any changes occur I encouraged him to continue follow-up with his primary care physicians in addition to letting us know at the pain clinic.  5. Chronic, continuous use of opioids I have reviewed the [facility] practitioner database information and is appropriate for refill.  6. Chronic pain  syndrome As above  7. Cervicalgia   8. Facet arthritis of lumbosacral region     ----------------------------------------------------------------------------------------------------------------------  I am having [name] "[name]" maintain his insulin lispro, loratadine, gabapentin, metFORMIN, Lantus SoloStar, naloxone, rosuvastatin, Insulin Pen Needle, aspirin EC, gabapentin, Multiple Vitamins-Minerals (MENS MULTI VITAMIN & MINERAL PO), dexlansoprazole, montelukast, Ozempic (1 MG/DOSE), albuterol, ammonium lactate, DULoxetine, cyclobenzaprine, Jardiance, DULoxetine, clopidogrel, levothyroxine, levocetirizine, albuterol, oxymetazoline, Vicks Vaporizer Scent, predniSONE, nitroGLYCERIN, Oxycodone HCl, ezetimibe, and furosemide. We administered triamcinolone acetonide, sodium chloride flush, ropivacaine (PF) 2 mg/mL (0.2%), and lidocaine (PF). We will continue to administer sodium chloride flush.   Meds ordered this encounter  Medications   triamcinolone acetonide (KENALOG-40) injection 40 mg   sodium chloride flush (NS) 0.9 % injection 10 mL   ropivacaine (PF) 2 mg/mL (0.2%) (NAROPIN) injection 10 mL   lidocaine (PF) (XYLOCAINE) 1 % injection 5 mL   iopamidol (ISOVUE-M) 41 % intrathecal injection 20 mL   Patient's Medications  New Prescriptions   No medications on file  Previous Medications   ALBUTEROL (PROVENTIL) (2.5 MG/3ML) 0.083% NEBULIZER SOLUTION    Take 3 mLs (2.5 mg total) by nebulization every 6 (six) hours as needed for wheezing or shortness of breath.   ALBUTEROL (VENTOLIN HFA) 108 (90 BASE) MCG/ACT INHALER    Inhale 2 puffs into the lungs daily.   AMMONIUM LACTATE (AMLACTIN) 12 % LOTION    Apply 1 application topically as needed for dry skin.   ASPIRIN EC 81 MG TABLET    Take 1 tablet (  81 mg total) by mouth daily.   CLOPIDOGREL (PLAVIX) 75 MG TABLET    TAKE 1 TABLET(75 MG) BY MOUTH DAILY WITH BREAKFAST   CYCLOBENZAPRINE (FLEXERIL) 10 MG TABLET    Take 10 mg by mouth 2  (two) times daily.   DEXLANSOPRAZOLE (DEXILANT) 60 MG CAPSULE    Take 1 capsule (60 mg total) by mouth daily.   DULOXETINE (CYMBALTA) 30 MG CAPSULE    Take 1 capsule (30 mg total) by mouth daily.   DULOXETINE (CYMBALTA) 60 MG CAPSULE    Take 1 capsule (60 mg total) by mouth daily.   EZETIMIBE (ZETIA) 10 MG TABLET    TAKE 1 TABLET(10 MG) BY MOUTH DAILY   FUROSEMIDE (LASIX) 20 MG TABLET    TAKE 1 TABLET BY MOUTH DAILY AS NEEDED FOR WEIGHT GAIN OF 2 POUNDS OVERNIGHT OR 5 POUNDS PER WEEK   GABAPENTIN (NEURONTIN) 300 MG CAPSULE    Take 1 capsule (300 mg total) by mouth 3 (three) times daily.   GABAPENTIN (NEURONTIN) 800 MG TABLET    Take 800 mg by mouth at bedtime.   HUMIDIFIER/VAPORIZER SUPPLIES (VICKS VAPORIZER SCENT) PADS    Apply 1 application. topically daily as needed (cough).   INSULIN LISPRO (HUMALOG) 100 UNIT/ML KIWKPEN    Inject 7-8 Units into the skin 3 (three) times daily. Reported on 12/02/2015/ sliding scale 1 unit for every 8 units of carbs; and 1 unit for every 20 above 120   INSULIN PEN NEEDLE 32G X 4 MM MISC    Inject 1 each into the skin as needed.   JARDIANCE 10 MG TABS TABLET    TAKE 1 TABLET(10 MG) BY MOUTH DAILY BEFORE BREAKFAST   LANTUS SOLOSTAR 100 UNIT/ML SOLOSTAR PEN    Inject 20-26 Units into the skin daily at 10 pm. Sliding scale based on blood glucose   LEVOCETIRIZINE (XYZAL) 5 MG TABLET    Take 1 tablet (5 mg total) by mouth every evening.   LEVOTHYROXINE (SYNTHROID) 50 MCG TABLET    TAKE 1 TABLET BY MOUTH DAILY MONDAY THROUGH SATURDAY AND 2 TABLETS EVERY SUNDAY, TAKE BEFORE BREAKFAST(RECHECK LEVELS IN 6 WEEKS)   LORATADINE (CLARITIN) 10 MG TABLET    Take 10 mg by mouth in the morning.   METFORMIN (GLUCOPHAGE) 1000 MG TABLET    Take 1,000 mg by mouth 2 (two) times daily with a meal.   MONTELUKAST (SINGULAIR) 10 MG TABLET    Take 10 mg by mouth at bedtime.   MULTIPLE VITAMINS-MINERALS (MENS MULTI VITAMIN & MINERAL PO)    Take 1 tablet by mouth in the morning and at bedtime.    NALOXONE (NARCAN) NASAL SPRAY 4 MG/0.1 ML    For excess sedation from opioids   NITROGLYCERIN (NITROSTAT) 0.3 MG SL TABLET    DISSOLVE 1 TABLET UNDER THE TONGUE EVERY 5 MINUTES AS NEEDED FOR CHEST PAIN. MAXIMUM 3 DOSES.   OXYCODONE HCL 10 MG TABS    Take 1 tablet (10 mg total) by mouth every 6 (six) hours.   OXYMETAZOLINE (AFRIN) 0.05 % NASAL SPRAY    Place 1 spray into both nostrils at bedtime as needed for congestion.   OZEMPIC, 1 MG/DOSE, 4 MG/3ML SOPN    Inject 1 mg into the skin once a week.   PREDNISONE (DELTASONE) 10 MG TABLET    Take 1-2 tablets (10-20 mg total) by mouth 2 (two) times daily with a meal.   ROSUVASTATIN (CRESTOR) 5 MG TABLET    Take 5 mg by mouth daily  with supper.  Modified Medications   No medications on file  Discontinued Medications   No medications on file   ----------------------------------------------------------------------------------------------------------------------  Follow-up: Return in about 2 months (around 07/11/2022) for evaluation, med refill.   Procedure: L5-S1 LESI with fluoroscopic guidance and no moderate sedation  NOTE: The risks, benefits, and expectations of the procedure have been discussed and explained to the patient who was understanding and in agreement with suggested treatment plan. No guarantees were made.  DESCRIPTION OF PROCEDURE: Lumbar epidural steroid injection with no IV Versed, EKG, blood pressure, pulse, and pulse oximetry monitoring. The procedure was performed with the patient in the prone position under fluoroscopic guidance.  Sterile prep x3 was initiated and I then injected subcutaneous lidocaine to the overlying L5-S1 site after its fluoroscopic identifictation.  Using strict aseptic technique, I then advanced an 18-gauge Tuohy epidural needle in the midline using interlaminar approach via loss-of-resistance to saline technique. There was negative aspiration for heme or  CSF.  I then confirmed position with both AP and  Lateral fluoroscan.  2 cc of contrast dye were injected and a  total of 5 mL of Preservative-Free normal saline mixed with 40 mg of Kenalog and 1cc Ropicaine 0.2 percent were injected incrementally via the  epidurally placed needle. The needle was removed. The patient tolerated the injection well and was convalesced and discharged to home in stable condition. Should the patient have any post procedure difficulty they have been instructed on how to contact us for assistance.    Molli Barrows, MD

## 2022-05-12 NOTE — Telephone Encounter (Signed)
No problems post procedure. 

## 2022-05-14 ENCOUNTER — Other Ambulatory Visit: Payer: Self-pay | Admitting: Family Medicine

## 2022-05-14 DIAGNOSIS — E063 Autoimmune thyroiditis: Secondary | ICD-10-CM

## 2022-05-22 ENCOUNTER — Other Ambulatory Visit: Payer: Self-pay | Admitting: Internal Medicine

## 2022-05-22 ENCOUNTER — Ambulatory Visit: Payer: Medicare Other | Admitting: Nurse Practitioner

## 2022-05-22 ENCOUNTER — Ambulatory Visit: Payer: Medicare Other | Admitting: Family Medicine

## 2022-05-22 ENCOUNTER — Encounter: Payer: Self-pay | Admitting: Nurse Practitioner

## 2022-05-22 VITALS — BP 100/74 | HR 98 | Ht 67.0 in | Wt 186.0 lb

## 2022-05-22 DIAGNOSIS — J302 Other seasonal allergic rhinitis: Secondary | ICD-10-CM

## 2022-05-22 DIAGNOSIS — R5383 Other fatigue: Secondary | ICD-10-CM

## 2022-05-22 DIAGNOSIS — E785 Hyperlipidemia, unspecified: Secondary | ICD-10-CM

## 2022-05-22 DIAGNOSIS — I5020 Unspecified systolic (congestive) heart failure: Secondary | ICD-10-CM

## 2022-05-22 DIAGNOSIS — I951 Orthostatic hypotension: Secondary | ICD-10-CM

## 2022-05-22 DIAGNOSIS — I2510 Atherosclerotic heart disease of native coronary artery without angina pectoris: Secondary | ICD-10-CM

## 2022-05-22 DIAGNOSIS — G4733 Obstructive sleep apnea (adult) (pediatric): Secondary | ICD-10-CM

## 2022-05-22 DIAGNOSIS — I255 Ischemic cardiomyopathy: Secondary | ICD-10-CM | POA: Diagnosis not present

## 2022-05-22 NOTE — Patient Instructions (Signed)
Medication Instructions:  Your physician recommends that you continue on your current medications as directed. Please refer to the Current Medication list given to you today.  *If you need a refill on your cardiac medications before your next appointment, please call your pharmacy*   Lab Work: CBC, TSH & BMET today over at the [place] [facility] then go to the 1st desk on the right to check in (REGISTRATION)   If you have labs (blood work) drawn today and your tests are completely normal, you will receive your results only by: [place] (if you have MyChart) OR A paper copy in the mail If you have any lab test that is abnormal or we need to change your treatment, we will call you to review the results.   Testing/Procedures: None   Follow-Up: At [facility], you and your health needs are our priority.  As part of our continuing mission to provide you with exceptional heart care, we have created designated Provider Care Teams.  These Care Teams include your primary Cardiologist (physician) and Advanced Practice Providers (APPs -  Physician Assistants and Nurse Practitioners) who all work together to provide you with the care you need, when you need it.   Your next appointment:   3 month(s)  The format for your next appointment:   In Person  Provider:   [name], MD or Murray Hodgkins, NP     Important Information About Sugar

## 2022-05-22 NOTE — Telephone Encounter (Signed)
Requested Prescriptions  Pending Prescriptions Disp Refills  . levocetirizine (XYZAL) 5 MG tablet [Pharmacy Med Name: LEVOCETIRIZINE 5MG TABLETS] 90 tablet 0    Sig: TAKE 1 TABLET(5 MG) BY MOUTH EVERY EVENING     Ear, Nose, and Throat:  Antihistamines - levocetirizine dihydrochloride Passed - 05/22/2022  3:34 AM      Passed - Cr in normal range and within 360 days    Creat  Date Value Ref Range Status  09/23/2021 1.16 0.70 - 1.35 mg/dL Final   Creatinine, Ser  Date Value Ref Range Status  01/28/2022 1.09 0.61 - 1.24 mg/dL Final   Creatinine, Urine  Date Value Ref Range Status  05/05/2019 172 20 - 320 mg/dL Final         Passed - eGFR is 10 or above and within 360 days    GFR, Est African American  Date Value Ref Range Status  10/16/2019 52 (L) > OR = 60 mL/min/1.79m Final   GFR calc Af Amer  Date Value Ref Range Status  09/17/2020 61 >59 mL/min/1.73 Final    Comment:    **In accordance with recommendations from the NKF-ASN Task force,**   [facility] is in the process of updating its eGFR calculation to the   2021 CKD-EPI creatinine equation that estimates kidney function   without a race variable.    GFR, Est Non African American  Date Value Ref Range Status  10/16/2019 45 (L) > OR = 60 mL/min/1.783mFinal   GFR, Estimated  Date Value Ref Range Status  01/28/2022 >60 >60 mL/min Final    Comment:    (NOTE) Calculated using the CKD-EPI Creatinine Equation (2021)    eGFR  Date Value Ref Range Status  12/10/2021 65 >59 mL/min/1.73 Final         Passed - Valid encounter within last 12 months    Recent Outpatient Visits          1 month ago Type 2 diabetes mellitus with diabetic polyneuropathy, with long-term current use of insulin ([facility]   3 months ago [facility] with another [facility]   3 months ago Fall, subsequent encounter   [facility]   [facility]   5 months ago Type 2 diabetes mellitus with diabetic polyneuropathy, with long-term current use of insulin [facility]      Future Appointments            Today BeTheora GianottiNP [facility] 1 week SoSteele SizerMD [facility] [facility] 1 month SoSteele SizerMD [facility] In 2 months  [facility]

## 2022-05-22 NOTE — Progress Notes (Signed)
Office Visit    Patient Name: [name] Date of Encounter: 05/22/2022  Primary Care Provider:  [name], MD Primary Cardiologist:  [name], MD  Chief Complaint    61 year old male with a history of CAD status post multiple PCI's, ischemic cardiomyopathy, HFrEF, hypertension, hyperlipidemia, type 2 diabetes mellitus, diabetic neuropathy, myasthenia gravis, sepsis, and obstructive sleep apnea, who presents for follow-up of orthostatic hypotension, CAD, and heart failure.  Past Medical History    Past Medical History:  Diagnosis Date   Allergy    dust, seasonal (worse in the fall).   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 Followed by Pain Specialist in [place], back and neck   Asthma    BRONCHITIS   Cataract    First Dx in 2012   Chronic combined systolic and diastolic congestive heart failure ([place])    a. 03/2018 Echo: EF 30-35%; b. 07/2018 Echo: EF 35-40%; c. 09/2019 TEE: EF 40-45%; d. 12/2020 Echo: EF 35-40%, sev apical ant, apical lat, apical inf, apical HK. GrI DD, nl RV fxn.e.01/2022  Echo: EF 45-50%.   Coronary artery disease    a. Prior Ant MI->s/p mult stents->LAD/RCA ([place]); b. 2016 Cath: nonobs dzs;  c. 04/2018 Cath/PCI: LCX 36m(3.25x15 SNovingerDES); d. 02/2022 MV: high risk; e. 02/2022 Cath: LM nl, LAD 20p ISR, patent mid-stent, LCX patent stent, OM1 nl, OM2 50p, patent stent, OM3 40p, patent stent, RCA 40p ISR, 240mSR, 4075m patent distal stent, RPDA patent stent, RPAV small, 80 (sl progression)-->Med Rx.   Deaf, left    Diabetes mellitus without complication ([place]  TYPE 2   Diabetic peripheral neuropathy ([place]  feet and hands   FUO (fever of unknown origin) 08/03/2018   GERD (gastroesophageal reflux disease)    Headache    muscle tension   Hyperlipidemia    Hypertension    Hyperthyroidism    Insomnia    Ischemic cardiomyopathy    a. 03/2018 Echo: EF 30-35%; b. 07/2018 Echo: EF 35-40%, Gr1 DD; c. 09/2019 TEE: EF 40-45%; d. 12/2020 Echo: EF  35-40%. GrI DD; e. 01/2022 Echo: EF 45-50%, no rwma, sev apical HK, nl RV fxn, Ao root 2m8m Knee pain, acute 05/06/2020   Left arm weakness 10/04/2019   Left leg weakness 12/01/2019   Lyme disease    Chronic   Myasthenia gravis (HCC)[place] (03/18/21 - no current treatment - "better than it has ever been" per pt)   Myocardial infarction (HCC)[place]   Palpitations    a. 10/2020 Zio: RSR, 88 avg. 3 brief SVT episodes (max 5 beats @ 128). Rare PACs/PVCs. Triggered events did not correlate w/ significant arrthymia - some w/ sinus tach.   Seasonal allergies    Sepsis (HCC)[place] a.07/2018 - unknown source. TEE neg for veg 09/2019.   Sleep apnea    CPAP   Wears hearing aid in both ears    Past Surgical History:  Procedure Laterality Date   BILATERAL CARPAL TUNNEL RELEASE Bilateral L in 2012 and R in 2013   CARDRungest recent in  March 2016.   COLONOSCOPY WITH PROPOFOL N/A 01/10/2016   Procedure: COLONOSCOPY WITH PROPOFOL;  Surgeon: DarrLucilla Lame;  Location: [facility];  Service: Endoscopy;  Laterality: N/A;   COLONOSCOPY WITH PROPOFOL N/A 04/14/2021   Procedure: COLONOSCOPY WITH PROPOFOL;  Surgeon: WohlLucilla Lame;  [facility] [facility];  Service: Endoscopy;  Laterality: N/A;  Diabetic - insulin and oral meds   CORONARY ANGIOPLASTY     CORONARY STENT INTERVENTION N/A 04/25/2018   Procedure: CORONARY STENT INTERVENTION;  Surgeon: [name], MD;  Location: [place] [facility];  Service: Cardiovascular;  Laterality: N/A;   ESOPHAGOGASTRODUODENOSCOPY (EGD) WITH PROPOFOL N/A 01/10/2016   Procedure: ESOPHAGOGASTRODUODENOSCOPY (EGD) WITH PROPOFOL;  Surgeon: [name], MD;  Location: [facility];  Service: Endoscopy;  Laterality: N/A;   ESOPHAGOGASTRODUODENOSCOPY (EGD) WITH PROPOFOL N/A 04/14/2021   Procedure: ESOPHAGOGASTRODUODENOSCOPY (EGD) WITH BIOPSY;  Surgeon: [name], MD;  Location: [place];  Service: Endoscopy;  Laterality:  N/A;   EYE SURGERY Bilateral 2012   cataract/bilateral vitrectomies   GIVENS CAPSULE STUDY N/A 08/20/2021   Procedure: GIVENS CAPSULE STUDY;  Surgeon: [name], MD;  Location: [facility] ENDOSCOPY;  Service: Endoscopy;  Laterality: N/A;   LEFT HEART CATH AND CORONARY ANGIOGRAPHY Left 04/25/2018   Procedure: LEFT HEART CATH AND CORONARY ANGIOGRAPHY;  Surgeon: [name], MD;  Location: [place] [facility];  Service: Cardiovascular;  Laterality: Left;   LEFT HEART CATH AND CORONARY ANGIOGRAPHY Left 02/02/2022   Procedure: LEFT HEART CATH AND CORONARY ANGIOGRAPHY;  Surgeon: [name], MD;  Location: [place] [facility];  Service: Cardiovascular;  Laterality: Left;   TEE WITHOUT CARDIOVERSION N/A 09/05/2018   Procedure: TRANSESOPHAGEAL ECHOCARDIOGRAM (TEE);  Surgeon: [name], MD;  Location: [facility];  Service: Cardiovascular;  Laterality: N/A;   TONSILLECTOMY AND ADENOIDECTOMY     As a child   TUNNELED VENOUS CATHETER PLACEMENT     removed    Allergies  Allergies  Allergen Reactions   Azathioprine Other (See Comments)    Azathioprine hypersensitivity reaction - Symptoms mimicking sepsis - was hospitalized   Novolog [Insulin Aspart] Hives    History of Present Illness    61 year old male with the above complex past medical history including CAD status post multiple PCI's, ischemic cardiomyopathy, HFrEF, hypertension, hyperlipidemia, type 2 diabetes mellitus, diabetic neuropathy, myasthenia gravis, sepsis, and obstructive sleep apnea.  He initially suffered a myocardial infarction in 2009 while living in [place].  He has had approximately 10 stents between the LAD and right coronary artery over time.  In June 2019 he had worsening dyspnea on exertion and chest pain.  Echo showed an EF of 30 to 35%.  Cath showed a new 90% stenosis in the mid left circumflex with multiple patent LAD, RCA, and OM 2 stents.  The circumflex was successfully treated with a drug-eluting stent.   He was admitted twice in the fall 2019 with sepsis.  TEE in December 2019 showed an EF of 40 to 45% without vegetation.  In October 2021, he had a syncopal episode in the setting of coughing.  He was subsequently found to be orthostatic and was encouraged to increase p.o. fluids.  Zio monitoring in January 2022, did not show any significant arrhythmias.  Triggered events were associated with sinus tachycardia.  Most recent echocardiogram March 2022 continued to show LV dysfunction with an EF of 35 to 40% and severe apical anterior, apical lateral, apical inferior, and apical hypokinesis.  He also has grade 1 diastolic dysfunction and normal RV function.  GDMT has historically been limited by dizziness and orthostasis.  He did not tolerate Entresto previously.  In March 2023, he began to experience orthostatic lightheadedness which resulted in reduction in metoprolol from 100 mg daily to 25 mg daily over the subsequent month.  In April  2023, he also reported recurrent chest pain and underwent stress testing which was high risk with a partially reversible defects in the apical to mid inferior, inferolateral, and apical to mid anterior locations.  He underwent diagnostic catheterization on Feb 02, 2022, showing predominantly moderate, nonobstructive disease with minimal in-stent restenosis.  He did have some progression of small vessel-RPA V disease (80%), and medical therapy was recommended.  In the setting of ongoing orthostatic lightheadedness, his metoprolol was reduced further to 12.5 mg daily and then subsequently discontinued altogether May 10 follow-up.  Mr. Wallen was last seen in cardiology clinic on May 24 at which time, he had complete resolution of orthostatic lightheadedness and was not requiring any as needed Lasix.  Since then, he has mostly done well.  He remains reasonably active around his home without significant symptoms or limitations but notes that after activity, he is very tired and will  often take a 3-hour nap in the afternoon.  He uses CPAP at night and feels as though he sleeps well.  He has not been experiencing any chest pain or significant dyspnea on exertion.  His weight has been stable around 185 pounds and he uses Lasix about once or twice a week, if he is feeling bloated/swollen.  He denies palpitations, PND, orthopnea, dizziness, syncope, edema, or early satiety.  Home Medications    Current Outpatient Medications  Medication Sig Dispense Refill   albuterol (PROVENTIL) (2.5 MG/3ML) 0.083% nebulizer solution Take 3 mLs (2.5 mg total) by nebulization every 6 (six) hours as needed for wheezing or shortness of breath. 75 mL 2   albuterol (VENTOLIN HFA) 108 (90 Base) MCG/ACT inhaler Inhale 2 puffs into the lungs daily.     ammonium lactate (AMLACTIN) 12 % lotion Apply 1 application topically as needed for dry skin. 400 g 3   aspirin EC 81 MG tablet Take 1 tablet (81 mg total) by mouth daily. 90 tablet 3   clopidogrel (PLAVIX) 75 MG tablet TAKE 1 TABLET(75 MG) BY MOUTH DAILY WITH BREAKFAST 30 tablet 0   cyclobenzaprine (FLEXERIL) 10 MG tablet Take 10 mg by mouth 2 (two) times daily.     dexlansoprazole (DEXILANT) 60 MG capsule Take 1 capsule (60 mg total) by mouth daily. (Patient taking differently: Take 1 capsule by mouth daily with supper.) 30 capsule 11   DULoxetine (CYMBALTA) 30 MG capsule Take 1 capsule (30 mg total) by mouth daily. 90 capsule 1   DULoxetine (CYMBALTA) 60 MG capsule Take 1 capsule (60 mg total) by mouth daily. (Patient taking differently: Take 60 mg by mouth at bedtime.) 90 capsule 1   ezetimibe (ZETIA) 10 MG tablet TAKE 1 TABLET(10 MG) BY MOUTH DAILY 90 tablet 0   furosemide (LASIX) 20 MG tablet TAKE 1 TABLET BY MOUTH DAILY AS NEEDED FOR WEIGHT GAIN OF 2 POUNDS OVERNIGHT OR 5 POUNDS PER WEEK 90 tablet 0   gabapentin (NEURONTIN) 300 MG capsule Take 1 capsule (300 mg total) by mouth 3 (three) times daily. 90 capsule 1   gabapentin (NEURONTIN) 800 MG tablet  Take 800 mg by mouth at bedtime.     Humidifier/Vaporizer Supplies (VICKS VAPORIZER SCENT) PADS Apply 1 application. topically daily as needed (cough).     insulin lispro (HUMALOG) 100 UNIT/ML KiwkPen Inject 7-8 Units into the skin 3 (three) times daily. Reported on 12/02/2015/ sliding scale 1 unit for every 8 units of carbs; and 1 unit for every 20 above 120     Insulin Pen Needle 32G X 4 MM  MISC Inject 1 each into the skin as needed.     JARDIANCE 10 MG TABS tablet TAKE 1 TABLET(10 MG) BY MOUTH DAILY BEFORE BREAKFAST 30 tablet 4   LANTUS SOLOSTAR 100 UNIT/ML Solostar Pen Inject 20-26 Units into the skin daily at 10 pm. Sliding scale based on blood glucose  0   levocetirizine (XYZAL) 5 MG tablet TAKE 1 TABLET(5 MG) BY MOUTH EVERY EVENING 90 tablet 1   levothyroxine (SYNTHROID) 50 MCG tablet TAKE 1 TABLET BY MOUTH DAILY MONDAY THROUGH SATURDAY AND 2 TABLETS EVERY SUNDAY. TAKE BEFORE BREAKFAST 102 tablet 0   loratadine (CLARITIN) 10 MG tablet Take 10 mg by mouth in the morning.     metFORMIN (GLUCOPHAGE) 1000 MG tablet Take 1,000 mg by mouth 2 (two) times daily with a meal.     montelukast (SINGULAIR) 10 MG tablet Take 10 mg by mouth at bedtime.     Multiple Vitamins-Minerals (MENS MULTI VITAMIN & MINERAL PO) Take 1 tablet by mouth in the morning and at bedtime.     naloxone (NARCAN) nasal spray 4 mg/0.1 mL For excess sedation from opioids 1 kit 2   nitroGLYCERIN (NITROSTAT) 0.3 MG SL tablet DISSOLVE 1 TABLET UNDER THE TONGUE EVERY 5 MINUTES AS NEEDED FOR CHEST PAIN. MAXIMUM 3 DOSES. 100 tablet 3   [START ON 05/28/2022] Oxycodone HCl 10 MG TABS Take 1 tablet (10 mg total) by mouth every 6 (six) hours. 120 tablet 0   [START ON 06/27/2022] Oxycodone HCl 10 MG TABS Take 1 tablet (10 mg total) by mouth in the morning, at noon, in the evening, and at bedtime. 120 tablet 0   oxymetazoline (AFRIN) 0.05 % nasal spray Place 1 spray into both nostrils at bedtime as needed for congestion.     OZEMPIC, 1 MG/DOSE,  4 MG/3ML SOPN Inject 1 mg into the skin once a week.     rosuvastatin (CRESTOR) 5 MG tablet Take 5 mg by mouth daily with supper.  0   predniSONE (DELTASONE) 10 MG tablet Take 1-2 tablets (10-20 mg total) by mouth 2 (two) times daily with a meal. (Patient not taking: Reported on 05/11/2022) 30 tablet 0   Current Facility-Administered Medications  Medication Dose Route Frequency Provider Last Rate Last Admin   sodium chloride flush (NS) 0.9 % injection 3 mL  3 mL Intravenous Q12H Furth, Cadence H, PA-C         Review of Systems    Does have some fatigue and sleepiness in the afternoon, with frequent napping.  He denies chest pain, dyspnea, palpitations, PND, orthopnea, dizziness, syncope, edema, or early satiety.  All other systems reviewed and are otherwise negative except as noted above.    Physical Exam    VS:  BP 100/74 (BP Location: Left Arm, Patient Position: Sitting, Cuff Size: Normal)   Pulse 98   Ht _0  (1.702 m)   Wt 186 lb (84.4 kg)   SpO2 97%   BMI 29.13 kg/m  , BMI Body mass index is 29.13 kg/m.     GEN: Well nourished, well developed, in no acute distress. HEENT: normal. Neck: Supple, no JVD, carotid bruits, or masses. Cardiac: RRR, no murmurs, rubs, or gallops. No clubbing, cyanosis, edema.  Radials/PT 2+ and equal bilaterally.  Respiratory:  Respirations regular and unlabored, clear to auscultation bilaterally. GI: Soft, nontender, nondistended, BS + x 4. MS: no deformity or atrophy. Skin: warm and dry, no rash. Neuro:  Strength and sensation are intact. Psych: Normal affect.  Accessory Clinical  Findings     Lab Results  Component Value Date   WBC 6.2 01/28/2022   HGB 13.5 01/28/2022   HCT 42.9 01/28/2022   MCV 93.3 01/28/2022   PLT 198 01/28/2022   Lab Results  Component Value Date   CREATININE 1.09 01/28/2022   BUN 17 01/28/2022   NA 137 01/28/2022   K 4.7 01/28/2022   CL 101 01/28/2022   CO2 29 01/28/2022   Lab Results  Component Value Date    ALT 21 09/23/2021   AST 18 09/23/2021   ALKPHOS 99 07/17/2021   BILITOT 0.4 09/23/2021   Lab Results  Component Value Date   CHOL 92 (L) 07/17/2021   HDL 28 (L) 07/17/2021   LDLCALC 39 07/17/2021   LDLDIRECT 36 07/17/2021   TRIG 145 07/17/2021   CHOLHDL 3.3 07/17/2021    Lab Results  Component Value Date   HGBA1C 7.8 03/24/2022    Assessment & Plan    1.  Coronary artery disease: Status post multiple prior stents with recent abnormal stress test and subsequent catheterization in May 2023 showing patent stents with minimal in-stent restenosis and some progression of small vessel disease involving the RPAV.  He has been doing well without chest pain or dyspnea on exertion.  He remains on medical therapy including aspirin, Zetia, and statin.  No beta-blocker or nitrate in the setting of orthostasis.  2.  Chronic heart failure with reduced ejection fraction/ischemic cardiomyopathy: Recent echo in April 2023, with slight improvement in EF to 45-50% with apical hypokinesis and normal RV function.  He has been doing well at home, using Lasix only about once or twice a week.  Weight has been stable and he is euvolemic on examination today.  GDMT limited by hypotension and currently, he is only on dapagliflozin.  3.  Orthostatic hypotension: Occasional lightheadedness but overall significantly improved since stopping metoprolol earlier this year.  4.  Essential hypertension: See #3.  Blood pressure soft but stable at 100/74.  5.  Hyperlipidemia: LDL of 36 in October 2022.  He remains on rosuvastatin and Zetia therapy.  6.  Type 2 diabetes mellitus: A1c 7.8 in June.  On multiple agents and followed by primary care.  7.  Obstructive sleep apnea: Compliant with CPAP.  8.  Daytime fatigue/tiredness: As above, has a history of sleep apnea but is compliant with CPAP and feels that he sleeps well at night.  He is able to be active throughout the day without symptoms or limitations but often  feels like he has to nap for several hours afterwards.  I will follow-up a CBC, basic metabolic panel, and TSH today.    9.  Disposition: Follow-up CBC, basic metabolic panel, and TSH today.  Follow-up in clinic in 3 months or sooner if necessary.   Murray Hodgkins, NP 05/22/2022, 2:27 PM

## 2022-05-24 ENCOUNTER — Other Ambulatory Visit: Payer: Self-pay | Admitting: Anesthesiology

## 2022-05-28 ENCOUNTER — Telehealth: Payer: Self-pay | Admitting: Anesthesiology

## 2022-05-28 NOTE — Telephone Encounter (Signed)
PT current pharmacy has the meds on back order. PT will like for [facility] [place]. PT stated that [facility] need autho for his meds, PT stated that he will be out tomorrow on both meds. Please give patient a call. Thanks

## 2022-05-28 NOTE — Telephone Encounter (Signed)
Attempted to call patient to see which medications he is referring too. No answer and unable to leave a message.

## 2022-05-29 ENCOUNTER — Other Ambulatory Visit: Payer: Self-pay | Admitting: *Deleted

## 2022-05-29 ENCOUNTER — Telehealth: Payer: Self-pay | Admitting: Anesthesiology

## 2022-05-29 MED ORDER — OXYCODONE HCL 10 MG PO TABS: 10.0000 mg | ORAL_TABLET | Freq: Four times a day (QID) | ORAL | 0 refills | Status: DC

## 2022-05-29 MED ORDER — CYCLOBENZAPRINE HCL 10 MG PO TABS: ORAL_TABLET | ORAL | 5 refills | Status: DC

## 2022-05-29 MED ORDER — CYCLOBENZAPRINE HCL 10 MG PO TABS
ORAL_TABLET | ORAL | 5 refills | Status: DC
Start: 2022-05-29 — End: 2023-01-01

## 2022-05-29 NOTE — Telephone Encounter (Signed)
Rx request sent to Dr. Adams. 

## 2022-05-29 NOTE — Telephone Encounter (Signed)
PT stated that [facility] [place] to see was his meds ready for pickup, was told that prescription hadn't been called in. PT stated that he called on yesterday and that he is out of meds.

## 2022-06-01 ENCOUNTER — Telehealth: Payer: Self-pay

## 2022-06-01 ENCOUNTER — Other Ambulatory Visit: Payer: Self-pay

## 2022-06-01 MED ORDER — OXYCODONE HCL 10 MG PO TABS: 10.0000 mg | ORAL_TABLET | Freq: Four times a day (QID) | ORAL | 0 refills | Status: DC

## 2022-06-01 NOTE — Telephone Encounter (Signed)
Pharmacist called and states that the script which he called to verify this morning with Dena was accidentally deleted in the system.  Dr [name] was  sent a refill request by myself.

## 2022-06-02 ENCOUNTER — Other Ambulatory Visit: Payer: Self-pay | Admitting: *Deleted

## 2022-06-02 ENCOUNTER — Ambulatory Visit: Payer: Medicare Other | Attending: Family Medicine | Admitting: Physical Therapy

## 2022-06-02 ENCOUNTER — Encounter: Payer: Self-pay | Admitting: Physical Therapy

## 2022-06-02 ENCOUNTER — Telehealth: Payer: Self-pay | Admitting: Anesthesiology

## 2022-06-02 ENCOUNTER — Telehealth: Payer: Self-pay

## 2022-06-02 DIAGNOSIS — M25561 Pain in right knee: Secondary | ICD-10-CM | POA: Insufficient documentation

## 2022-06-02 DIAGNOSIS — M4697 Unspecified inflammatory spondylopathy, lumbosacral region: Secondary | ICD-10-CM | POA: Diagnosis not present

## 2022-06-02 DIAGNOSIS — M6281 Muscle weakness (generalized): Secondary | ICD-10-CM | POA: Diagnosis present

## 2022-06-02 DIAGNOSIS — R269 Unspecified abnormalities of gait and mobility: Secondary | ICD-10-CM | POA: Insufficient documentation

## 2022-06-02 DIAGNOSIS — Q742 Other congenital malformations of lower limb(s), including pelvic girdle: Secondary | ICD-10-CM | POA: Diagnosis not present

## 2022-06-02 DIAGNOSIS — M5459 Other low back pain: Secondary | ICD-10-CM | POA: Diagnosis present

## 2022-06-02 MED ORDER — OXYCODONE HCL 10 MG PO TABS: 10.0000 mg | ORAL_TABLET | Freq: Four times a day (QID) | ORAL | 0 refills | Status: AC

## 2022-06-02 NOTE — Telephone Encounter (Signed)
Spoke with patient and he states that [facility] now [facility] and just need [place] to call and approve it being filled due to it being sent to [facility].  [facility]ed [facility] call [facility] but call would not go through.  [facility] and they state that they do not have the script because it was deleted yesterday.  [facility] was [facility]. Refill request has been sent to Dr [name] by D. Wheatley rn to [facility].  Patient notified that we were waiting on Dr [name] to resend.

## 2022-06-02 NOTE — Telephone Encounter (Signed)
Pharmacy has an question about pt oxycodone prescription. Please give pharmacy a call. Thanks

## 2022-06-02 NOTE — Telephone Encounter (Signed)
Attempted to call patient, "call cannot be completed."

## 2022-06-02 NOTE — Telephone Encounter (Signed)
I returned pharmacy phone call, pharmacy does not have any questions re; Rx.  Pharmacist said all that he has is a flexeril Rx and no oxycodone on file.  I told him that there were 2 that were sent in on yesterday.  Pharmacist states, "you can keep repeating that all you want, but I do not have the Rx."  He was raising his voice and talking over me.  I finally told him that the only reason I called is that there was a message from them that said they had a question. I told him that I would let him go because apparently he was having a horrible day, thank you.

## 2022-06-02 NOTE — Telephone Encounter (Signed)
Rx request sent to Dr. Adams. 

## 2022-06-03 NOTE — Progress Notes (Unsigned)
Name: [name]   MRN: [id number]    DOB: 10/07/1960   Date:06/04/2022       Progress Note  Subjective  Chief Complaint  Disability Paperwork  HPI  Asthma :he had 4 rounds of prednisone this year, he is under the care of Dr. Raul Del, states currently no cough, wheezing but has SOB with activity - back to baseline . Currently on Breo and albuterol prn    DMII:  he is under the care of Dr. Honor Junes , A1C was 8.4 % was down to 7.9 % went up to  8.3 %  7.6 % last one in June was 7.8 %  He had multiple rounds of prednisone which affected his glucose. Ozempic adjusted to 2 mg weekly  He has associated HTN, dyslipidemia, neuropathy.  He is on statin therapy, ARB and also gabapentin. Taking insulin,  Metformin , SGL2 agonist and also GLP-1 agonist  .   Hypothyroidism: he is currently on 50 mcg of levothyroxine but two on Sundays , he has  constipation but likely secondary to oxydocone  but better with stool softener .TSH has been at goal    Low back pain/ inflammatory spondylopathy/Cervical radiculitis /Chronic pain syndrome/lyme arthritis : under the care of Dr. [name], he gets epidural injections and takes oxycodone and gabapentin .  Pain level is 5/10 on back , radiating mostly to left side, , he uses a cane , he cannot sit , stand , walk for a prolonged period of time, he was originally placed of disability for heart disease but now has other co-morbidities and back pain is one of them that causes weakness    Malnutrition: he has lost 63 lbs since Dec 2021, and 18 lbs since Feb 2023 - past 6 months  However due to his  anemia / iron deficiency of unknown cause , we referred him to hematologist/oncologist. Evaluation was negative. She discussed considering stopping GLP-1 agonist , but that is managed by Endo. He has a follow up coming up with GI, but bowel movements back to normal and dysphagia resolved . He had his teeth removed recently and thinks that is causing him not to eat as much    Iron  deficiency anemia plus weight loss: seeing GI and has gastritis and duodenitis but taking Dexilant, he had a capsule endoscopy but it has to be repeated since the capsule stayed in the stomach. He has an appointment coming up    Myasthenia Gravis: seeing Dr. Westley Foots, he is off Mestinon, currently not on medications , he denies He still has left eye ptosis but not as severe , no double visions.   Atherosclerosis of aorta/ Angina and CHF: he states sleeps with the head of the bed elevated because of his wife, he has SOB with activity that is likely multifactorial,  denies lower extremity. He states chest pain not as frequent, he denies .BP is towards low end of normal . He is off metoprolol but taking SGL-2 agonist, he is also taking plavix , crestor and zetia.   MDD:  he has a history of recurrent depression, and worse over the past 24  months, father is still very sick,  wife has chronic medical problems, his health is not good Son is having marital problems.    OSA: he wears CPAP for  6 nights per week for about 8 hours. 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[place]) 12/02/2021   Gastropathy 08/13/2021   Iron deficiency anemia due to chronic blood loss    Pain due to onychomycosis of toenails of both feet 11/11/2020   Lung involvement associated with another disorder ([place]) 05/17/2020   Lower urinary tract symptoms 09/15/2019   Incomplete bladder emptying 09/15/2019   Carpal tunnel syndrome on both sides 07/12/2019   Idiopathic peripheral neuropathy 04/21/2019   Sensory ataxia 03/09/2019   OSA on CPAP 07/08/2018   Congestive heart failure (CHF) ([place]) 05/05/2018   Unstable angina ([place]) 04/26/2018   Ischemic cardiomyopathy 04/26/2018   Chronic combined systolic and diastolic CHF (congestive heart failure)  ([place]) 04/26/2018   Anginal equivalent ([place]) 04/25/2018   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[place]) 01/25/2018   Vitamin D deficiency, unspecified 08/12/2017   Ptosis of left eyelid 01/08/2017   Dermatitis 12/24/2016   Benign neoplasm of sigmoid colon    Benign neoplasm of descending colon    Benign neoplasm of transverse colon    Coronary artery disease involving native coronary artery with angina pectoris ([place]) 10/22/2015   Coronary artery disease 08/22/2015   Myasthenia gravis ([place]) 08/22/2015   Chronic left-sided low back pain 08/22/2015   Hypertension 08/22/2015   GERD (gastroesophageal reflux disease) 08/22/2015   Hyperlipidemia 08/22/2015   Insomnia 08/22/2015   Type 2 diabetes mellitus with diabetic polyneuropathy, with long-term current use of insulin ([place]) 08/22/2015   Asthma 08/22/2015   Lyme disease 06/05/1992    Past Surgical History:  Procedure Laterality Date   BILATERAL CARPAL TUNNEL RELEASE Bilateral L in 2012 and R in 2013   [place], most recent in  March 2016.   COLONOSCOPY WITH PROPOFOL N/A 01/10/2016   Procedure: COLONOSCOPY WITH PROPOFOL;  Surgeon: [name], MD;  Location: [facility] ENDOSCOPY;  Service: Endoscopy;  Laterality: N/A;   COLONOSCOPY WITH PROPOFOL N/A 04/14/2021   Procedure: COLONOSCOPY WITH PROPOFOL;  Surgeon: [name], MD;  Location: [place];  Service: Endoscopy;  Laterality: N/A;  Diabetic - insulin and oral meds   CORONARY ANGIOPLASTY     CORONARY STENT INTERVENTION N/A 04/25/2018   Procedure: CORONARY STENT INTERVENTION;  Surgeon: [name], MD;  Location: [place] [facility];  Service: Cardiovascular;  Laterality: N/A;   ESOPHAGOGASTRODUODENOSCOPY (EGD) WITH PROPOFOL N/A 01/10/2016   Procedure: ESOPHAGOGASTRODUODENOSCOPY (EGD) WITH PROPOFOL;  Surgeon: [name], MD;  Location: [facility] ENDOSCOPY;  Service: Endoscopy;  Laterality: N/A;   ESOPHAGOGASTRODUODENOSCOPY (EGD) WITH  PROPOFOL N/A 04/14/2021   Procedure: ESOPHAGOGASTRODUODENOSCOPY (EGD) WITH BIOPSY;  Surgeon: [name], MD;  Location: [place];  Service: Endoscopy;  Laterality: N/A;   EYE SURGERY Bilateral 2012   cataract/bilateral vitrectomies   GIVENS CAPSULE STUDY N/A 08/20/2021   Procedure: GIVENS CAPSULE STUDY;  Surgeon: [name], MD;  Location: [facility] ENDOSCOPY;  Service: Endoscopy;  Laterality: N/A;   LEFT HEART CATH AND CORONARY ANGIOGRAPHY Left 04/25/2018   Procedure: LEFT HEART CATH AND CORONARY ANGIOGRAPHY;  Surgeon: [name], MD;  Location: [place] [facility];  Service: Cardiovascular;  Laterality: Left;   LEFT HEART CATH AND CORONARY ANGIOGRAPHY Left 02/02/2022   Procedure: LEFT HEART CATH AND CORONARY ANGIOGRAPHY;  Surgeon: [name], MD;  Location: [place] [facility];  Service: Cardiovascular;  Laterality: Left;   TEE WITHOUT CARDIOVERSION N/A 09/05/2018   Procedure: TRANSESOPHAGEAL ECHOCARDIOGRAM (TEE);  Surgeon: [name], MD;  Location: [facility]  ORS;  Service: Cardiovascular;  Laterality: N/A;   TONSILLECTOMY AND ADENOIDECTOMY     As a child   TUNNELED VENOUS CATHETER PLACEMENT     removed    Family History  Problem Relation Age of Onset   Diabetes Mother    Heart disease Mother    Cancer Father        Prostate CA, Anal cancer    Dementia Father    Diabetes Brother    Healthy Brother    Healthy Brother     Social History   Tobacco Use   Smoking status: Never   Smokeless tobacco: Never   Tobacco comments:    smoking cessation materials not required  Substance Use Topics   Alcohol use: Not Currently    Alcohol/week: 0.0 standard drinks of alcohol     Current Outpatient Medications:    albuterol (PROVENTIL) (2.5 MG/3ML) 0.083% nebulizer solution, Take 3 mLs (2.5 mg total) by nebulization every 6 (six) hours as needed for wheezing or shortness of breath., Disp: 75 mL, Rfl: 2   albuterol (VENTOLIN HFA) 108 (90 Base) MCG/ACT inhaler,  Inhale 2 puffs into the lungs daily., Disp: , Rfl:    ammonium lactate (AMLACTIN) 12 % lotion, Apply 1 application topically as needed for dry skin., Disp: 400 g, Rfl: 3   aspirin EC 81 MG tablet, Take 1 tablet (81 mg total) by mouth daily., Disp: 90 tablet, Rfl: 3   clopidogrel (PLAVIX) 75 MG tablet, TAKE 1 TABLET(75 MG) BY MOUTH DAILY WITH BREAKFAST, Disp: 30 tablet, Rfl: 0   cyclobenzaprine (FLEXERIL) 10 MG tablet, TAKE 1 TABLET(10 MG) BY MOUTH TWICE DAILY, Disp: 60 tablet, Rfl: 5   dexlansoprazole (DEXILANT) 60 MG capsule, Take 1 capsule (60 mg total) by mouth daily., Disp: 30 capsule, Rfl: 11   DULoxetine (CYMBALTA) 30 MG capsule, Take 1 capsule (30 mg total) by mouth daily., Disp: 90 capsule, Rfl: 1   DULoxetine (CYMBALTA) 60 MG capsule, Take 1 capsule (60 mg total) by mouth daily., Disp: 90 capsule, Rfl: 1   ezetimibe (ZETIA) 10 MG tablet, TAKE 1 TABLET(10 MG) BY MOUTH DAILY, Disp: 90 tablet, Rfl: 0   fluticasone furoate-vilanterol (BREO ELLIPTA) 200-25 MCG/ACT AEPB, Inhale 1 puff into the lungs daily., Disp: , Rfl:    furosemide (LASIX) 20 MG tablet, TAKE 1 TABLET BY MOUTH DAILY AS NEEDED FOR WEIGHT GAIN OF 2 POUNDS OVERNIGHT OR 5 POUNDS PER WEEK, Disp: 90 tablet, Rfl: 0   gabapentin (NEURONTIN) 300 MG capsule, Take 1 capsule (300 mg total) by mouth 3 (three) times daily., Disp: 90 capsule, Rfl: 1   gabapentin (NEURONTIN) 800 MG tablet, Take 800 mg by mouth at bedtime., Disp: , Rfl:    Humidifier/Vaporizer Supplies (VICKS VAPORIZER SCENT) PADS, Apply 1 application. topically daily as needed (cough)., Disp: , Rfl:    insulin lispro (HUMALOG) 100 UNIT/ML KiwkPen, Inject 7-8 Units into the skin 3 (three) times daily. Reported on 12/02/2015/ sliding scale 1 unit for every 8 units of carbs; and 1 unit for every 20 above 120, Disp: , Rfl:    Insulin Pen Needle 32G X 4 MM MISC, Inject 1 each into the skin as needed., Disp: , Rfl:    JARDIANCE 10 MG TABS tablet, TAKE 1 TABLET(10 MG) BY MOUTH DAILY  BEFORE BREAKFAST, Disp: 30 tablet, Rfl: 4   LANTUS SOLOSTAR 100 UNIT/ML Solostar Pen, Inject 20-26 Units into the skin daily at 10 pm. Sliding scale based on blood glucose, Disp: , Rfl: 0   levocetirizine (  XYZAL) 5 MG tablet, TAKE 1 TABLET(5 MG) BY MOUTH EVERY EVENING, Disp: 90 tablet, Rfl: 1   levothyroxine (SYNTHROID) 50 MCG tablet, TAKE 1 TABLET BY MOUTH DAILY MONDAY THROUGH SATURDAY AND 2 TABLETS EVERY SUNDAY. TAKE BEFORE BREAKFAST, Disp: 102 tablet, Rfl: 0   loratadine (CLARITIN) 10 MG tablet, Take 10 mg by mouth in the morning., Disp: , Rfl:    metFORMIN (GLUCOPHAGE) 1000 MG tablet, Take 1,000 mg by mouth 2 (two) times daily with a meal., Disp: , Rfl:    montelukast (SINGULAIR) 10 MG tablet, Take 10 mg by mouth at bedtime., Disp: , Rfl:    Multiple Vitamins-Minerals (MENS MULTI VITAMIN & MINERAL PO), Take 1 tablet by mouth in the morning and at bedtime., Disp: , Rfl:    naloxone (NARCAN) nasal spray 4 mg/0.1 mL, For excess sedation from opioids, Disp: 1 kit, Rfl: 2   nitroGLYCERIN (NITROSTAT) 0.3 MG SL tablet, DISSOLVE 1 TABLET UNDER THE TONGUE EVERY 5 MINUTES AS NEEDED FOR CHEST PAIN. MAXIMUM 3 DOSES., Disp: 100 tablet, Rfl: 3   [START ON 06/27/2022] Oxycodone HCl 10 MG TABS, Take 1 tablet (10 mg total) by mouth in the morning, at noon, in the evening, and at bedtime., Disp: 120 tablet, Rfl: 0   Oxycodone HCl 10 MG TABS, Take 1 tablet (10 mg total) by mouth every 6 (six) hours., Disp: 120 tablet, Rfl: 0   rosuvastatin (CRESTOR) 5 MG tablet, Take 5 mg by mouth daily with supper., Disp: , Rfl: 0   Semaglutide, 2 MG/DOSE, 8 MG/3ML SOPN, Inject 1 each into the skin once a week., Disp: , Rfl:   Current Facility-Administered Medications:    sodium chloride flush (NS) 0.9 % injection 3 mL, 3 mL, Intravenous, Q12H, Furth, Cadence H, PA-C  Allergies  Allergen Reactions   Azathioprine Other (See Comments)    Azathioprine hypersensitivity reaction - Symptoms mimicking sepsis - was hospitalized    Novolog [Insulin Aspart] Hives    I personally reviewed active problem list, medication list, allergies, family history, social history, health maintenance with the patient/caregiver today.   ROS  Constitutional: Negative for fever , positive for weight change.  Respiratory: Negative for cough, positive shortness of breath.   Cardiovascular: Negative for chest pain or palpitations.  Gastrointestinal: Negative for abdominal pain, no bowel changes.  Musculoskeletal: positive  for gait problem but no joint swelling.  Skin: Negative for rash.  Neurological: Negative for dizziness or headache.  No other specific complaints in a complete review of systems (except as listed in HPI above).   Objective  Vitals:   06/04/22 0952  BP: 102/60  Pulse: 91  Resp: 16  SpO2: 97%  Weight: 184 lb (83.5 kg)  Height: $Remove'5\' 7"'JAFnupR$  (1.702 m)    Body mass index is 28.82 kg/m.  Physical Exam  Constitutional: Patient appears well-developed and well-nourished.  No distress.  HEENT: head atraumatic, normocephalic, pupils equal and reactive to light, neck supple Cardiovascular: Normal rate, regular rhythm and normal heart sounds.  No murmur heard. No BLE edema. Pulmonary/Chest: Effort normal , he has end inspiratory wheezing. No respiratory distress. Abdominal: Soft.  There is no tenderness. Muscular Skeletal: using a cane, slow gait, decrease abduction of right shoulder  Psychiatric: Patient has a normal mood and affect. behavior is normal. Judgment and thought content normal.   Recent Results (from the past 2160 hour(s))  Hemoglobin A1c     Status: None   Collection Time: 03/24/22 12:00 AM  Result Value Ref Range   Hemoglobin  A1C 7.8     PHQ2/9:    06/04/2022   10:02 AM 04/17/2022    1:45 PM 04/17/2022    1:37 PM 02/17/2022   11:26 AM 01/27/2022    9:39 AM  Depression screen PHQ 2/9  Decreased Interest 1 0 0 1 0  Down, Depressed, Hopeless 1 2 2  0 1  PHQ - 2 Score 2 2 2 1 1   Altered sleeping 3 3  0 0 0  Tired, decreased energy 0 0 0 1 1  Change in appetite 3 2 0 0 0  Feeling bad or failure about yourself  0 0 0 0 0  Trouble concentrating 0 0 0 0 0  Moving slowly or fidgety/restless 0 0 0 0 0  Suicidal thoughts 0 0 0 0 0  PHQ-9 Score 8 7 2 2 2   Difficult doing work/chores  Somewhat difficult Not difficult at all Not difficult at all Not difficult at all    phq 9 is positive   Fall Risk:    06/04/2022    9:51 AM 05/11/2022    8:11 AM 04/17/2022    1:37 PM 02/17/2022   11:25 AM 01/27/2022    9:39 AM  Fall Risk   Falls in the past year? 1 0 1 1 1   Number falls in past yr: 1  1 1 1   Injury with Fall? 0  1 1 1   Risk for fall due to : Impaired balance/gait;Impaired mobility  Impaired balance/gait;Impaired mobility History of fall(s) Impaired balance/gait;Impaired mobility  Follow up Falls prevention discussed   Falls prevention discussed;Education provided;Falls evaluation completed       Functional Status Survey: Is the patient deaf or have difficulty hearing?: Yes Does the patient have difficulty seeing, even when wearing glasses/contacts?: Yes Does the patient have difficulty concentrating, remembering, or making decisions?: No Does the patient have difficulty walking or climbing stairs?: Yes Does the patient have difficulty dressing or bathing?: No Does the patient have difficulty doing errands alone such as visiting a doctor's office or shopping?: Yes    Assessment & Plan  1. Unstable angina (HCC)  Stable, on medication management, sees cardiologist   2. Type 2 diabetes mellitus with diabetic polyneuropathy, with long-term current use of insulin (HCC)  Seeing Endo, ozempic dose recently adjusted   3. Inflammatory spondylopathy of lumbosacral region [facility])  Under the care of pain clinic   4. Atherosclerosis of aorta ([place])  On statin therapy   5. Chronic combined systolic and diastolic CHF (congestive heart failure) (HCC)  Stable  6. Coronary artery disease  of native artery of native heart with stable angina pectoris ([place])   7. Mild protein-calorie malnutrition ([place])  Discussed high calorie diet  8. Myasthenia gravis ([place])  Off medication  9. Need for immunization against influenza  - Flu Vaccine QUAD 6+ mos PF IM (Fluarix Quad PF)  10. Ischemic cardiomyopathy   11. Hypothyroidism, acquired, autoimmune

## 2022-06-03 NOTE — Therapy (Signed)
[facility] [address] [place], [place], [address] Phone: [phone]   Fax:  [phone]  Physical Therapy Evaluation  Patient Details  Name: [name] MRN: [id number] Date of Birth: 1961-05-09 No data recorded  Encounter Date: 06/02/2022   PT End of Session - 06/03/22 0751     Visit Number 1    Number of Visits 1    Date for PT Re-Evaluation 06/03/22    PT Start Time 0727    PT Stop Time 0902    PT Time Calculation (min) 95 min    Equipment Utilized During Treatment Gait belt    Activity Tolerance Patient limited by pain;Patient limited by fatigue    Behavior During Therapy [facility] for tasks assessed/performed             Past Medical History:  Diagnosis Date   Allergy    dust, seasonal (worse in the fall).   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 Followed by Pain Specialist in [address], back and neck   Asthma    BRONCHITIS   Cataract    First Dx in 2012   Chronic combined systolic and diastolic congestive heart failure ([place])    a. 03/2018 Echo: EF 30-35%; b. 07/2018 Echo: EF 35-40%; c. 09/2019 TEE: EF 40-45%; d. 12/2020 Echo: EF 35-40%, sev apical ant, apical lat, apical inf, apical HK. GrI DD, nl RV fxn.e.01/2022  Echo: EF 45-50%.   Coronary artery disease    a. Prior Ant MI->s/p mult stents->LAD/RCA ([place]); b. 2016 Cath: nonobs dzs;  c. 04/2018 Cath/PCI: LCX 67m[place]x15 [place] d. 02/2022 MV: high risk; e. 02/2022 Cath: LM nl, LAD 20p ISR, patent mid-stent, LCX patent stent, OM1 nl, OM2 50p, patent stent, OM3 40p, patent stent, RCA 40p ISR, 268mSR, 4066m patent distal stent, RPDA patent stent, RPAV small, 80 (sl progression)-->Med Rx.   Deaf, left    Diabetes mellitus without complication ([place]  TYPE 2   Diabetic peripheral neuropathy ([place]  feet and hands   FUO (fever of unknown origin) 08/03/2018   GERD (gastroesophageal reflux disease)    Headache    muscle tension   Hyperlipidemia    Hypertension     Hyperthyroidism    Insomnia    Ischemic cardiomyopathy    a. 03/2018 Echo: EF 30-35%; b. 07/2018 Echo: EF 35-40%, Gr1 DD; c. 09/2019 TEE: EF 40-45%; d. 12/2020 Echo: EF 35-40%. GrI DD; e. 01/2022 Echo: EF 45-50%, no rwma, sev apical HK, nl RV fxn, Ao root 61m62m Knee pain, acute 05/06/2020   Left arm weakness 10/04/2019   Left leg weakness 12/01/2019   Lyme disease    Chronic   Myasthenia gravis (HCC)[place] (03/18/21 - no current treatment - "better than it has ever been" per pt)   Myocardial infarction (HCC)[id number]   Palpitations    a. 10/2020 Zio: RSR, 88 avg. 3 brief SVT episodes (max 5 beats @ 128). Rare PACs/PVCs. Triggered events did not correlate w/ significant arrthymia - some w/ sinus tach.   Seasonal allergies    Sepsis (HCC)[place] a.07/2018 - unknown source. TEE neg for veg 09/2019.   Sleep apnea    CPAP   Wears hearing aid in both ears     Past Surgical History:  Procedure Laterality Date   BILATERAL CARPAL TUNNEL RELEASE Bilateral L in 2012 and R in 2013   [place]  Several Caths, most recent in  March 2016.   COLONOSCOPY WITH PROPOFOL N/A 01/10/2016   Procedure: COLONOSCOPY WITH PROPOFOL;  Surgeon: [name], MD;  Location: [facility] ENDOSCOPY;  Service: Endoscopy;  Laterality: N/A;   COLONOSCOPY WITH PROPOFOL N/A 04/14/2021   Procedure: COLONOSCOPY WITH PROPOFOL;  Surgeon: [name], MD;  Location: [facility];  Service: Endoscopy;  Laterality: N/A;  Diabetic - insulin and oral meds   CORONARY ANGIOPLASTY     CORONARY STENT INTERVENTION N/A 04/25/2018   Procedure: CORONARY STENT INTERVENTION;  Surgeon: [name], MD;  Location: [place] [facility];  Service: Cardiovascular;  Laterality: N/A;   ESOPHAGOGASTRODUODENOSCOPY (EGD) WITH PROPOFOL N/A 01/10/2016   Procedure: ESOPHAGOGASTRODUODENOSCOPY (EGD) WITH PROPOFOL;  Surgeon: [name], MD;  Location: [facility] ENDOSCOPY;  Service: Endoscopy;  Laterality: N/A;   ESOPHAGOGASTRODUODENOSCOPY (EGD) WITH  PROPOFOL N/A 04/14/2021   Procedure: ESOPHAGOGASTRODUODENOSCOPY (EGD) WITH BIOPSY;  Surgeon: [name], MD;  Location: [place];  Service: Endoscopy;  Laterality: N/A;   EYE SURGERY Bilateral 2012   cataract/bilateral vitrectomies   GIVENS CAPSULE STUDY N/A 08/20/2021   Procedure: GIVENS CAPSULE STUDY;  Surgeon: [name], MD;  Location: [facility];  Service: Endoscopy;  Laterality: N/A;   LEFT HEART CATH AND CORONARY ANGIOGRAPHY Left 04/25/2018   Procedure: LEFT HEART CATH AND CORONARY ANGIOGRAPHY;  Surgeon: [name], MD;  Location: [place] [facility];  Service: Cardiovascular;  Laterality: Left;   LEFT HEART CATH AND CORONARY ANGIOGRAPHY Left 02/02/2022   Procedure: LEFT HEART CATH AND CORONARY ANGIOGRAPHY;  Surgeon: [name], MD;  Location: [place] [facility];  Service: Cardiovascular;  Laterality: Left;   TEE WITHOUT CARDIOVERSION N/A 09/05/2018   Procedure: TRANSESOPHAGEAL ECHOCARDIOGRAM (TEE);  Surgeon: [name], MD;  Location: [facility];  Service: Cardiovascular;  Laterality: N/A;   TONSILLECTOMY AND ADENOIDECTOMY     As a child   TUNNELED VENOUS CATHETER PLACEMENT     removed    There were no vitals filed for this visit.    Subjective Assessment - 06/03/22 0751     Subjective See FCE report    Patient Stated Goals FCE only    Currently in Pain? Yes    Pain Score 6     Pain Location Back                 Plan - 06/03/22 0752     Clinical Impression Statement Overall Level of Work: Falls within the Light range.  Exerting up to 20 pounds of force occasionally, and/or up to 10 pounds of force frequently, and/or a negligible amount of force constantly (Constantly: activity or condition exist 2/3 or more of the time) to move objects.  Physical demand requirements are in excess of those for Sedentary Work.  Even though the weight lifted may be only a negligible amount, a job should be rated Light Work: (1) when it requires walking  or standing to significant degree; or (2) when it requires sitting most of the time but entails pushing and/or pulling of arm or leg controls; and/or (3) when the job requires working at a production rate pace entailing the constant pushing and/or pulling of materials even though the weight of those materials is negligible.  NOTE: The constant stress and strain of maintaining a production rate pace, especially in an industrial setting, can be and is physically demanding of a worker even though the amount of force exerted is negligible.    Please see the Task Performance Table  for specific abilities.  Tolerance for the 8-Hour Day: Due to the client's limited ability to walk and stand, he would have to alternate between standing, walking and other tasks as listed in the task performance table to be able to tolerate the Light level of work for the 8-hour day/40-hour week.    Stability/Clinical Decision Making Stable/Uncomplicated    Clinical Decision Making Low    Rehab Potential Fair    PT Frequency One time visit    PT Treatment/Interventions ADLs/Self Care Home Management;Balance training;Therapeutic activities;Functional mobility training;Therapeutic exercise;Gait training;Stair training;Neuromuscular re-education    PT Next Visit Plan FCE only             Patient will benefit from skilled therapeutic intervention in order to improve the following deficits and impairments:  Abnormal gait, Pain, Improper body mechanics, Impaired sensation, Postural dysfunction, Decreased mobility, Decreased activity tolerance, Decreased endurance, Decreased range of motion, Decreased strength, Hypomobility, Impaired flexibility, Difficulty walking, Decreased balance  Visit Diagnosis: Other low back pain  Muscle weakness (generalized)  Gait difficulty     Problem List Patient Active Problem List   Diagnosis Date Noted   Abnormal nuclear cardiac imaging test    Seasonal allergies 01/27/2022    Hypothyroidism, acquired, autoimmune 01/27/2022   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 ([place]) 12/02/2021   Gastropathy 08/13/2021   Unintentional weight loss 08/13/2021   Iron deficiency anemia due to chronic blood loss    Pain due to onychomycosis of toenails of both feet 11/11/2020   Lung involvement associated with another disorder ([place]) 05/17/2020   Left leg weakness 12/01/2019   Lower urinary tract symptoms 09/15/2019   Incomplete bladder emptying 09/15/2019   Carpal tunnel syndrome on both sides 07/12/2019   Weakness generalized 06/21/2019   Idiopathic peripheral neuropathy 04/21/2019   Sensory ataxia 03/09/2019   Colitis 07/08/2018   OSA on CPAP 07/08/2018   Congestive heart failure (CHF) ([place]) 05/05/2018   Unstable angina ([place]) 04/26/2018   Ischemic cardiomyopathy 04/26/2018   Chronic combined systolic and diastolic CHF (congestive heart failure) ([place]) 04/26/2018   Anginal equivalent ([place]) 04/25/2018   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[place]) 01/25/2018   Vitamin D deficiency, unspecified 08/12/2017   Ptosis of left eyelid 01/08/2017   Dermatitis 12/24/2016   Difficulty walking 04/27/2016   Foot cramps 04/27/2016   Benign neoplasm of sigmoid colon    Benign neoplasm of descending colon    Benign neoplasm of transverse colon    Coronary artery disease involving native coronary artery with angina pectoris ([place]) 10/22/2015   Coronary artery disease 08/22/2015   Myasthenia gravis ([place]) 08/22/2015   Chronic left-sided low back pain 08/22/2015   Hypertension 08/22/2015   GERD (gastroesophageal reflux disease) 08/22/2015   Hyperlipidemia 08/22/2015   Insomnia 08/22/2015   Type 2 diabetes mellitus with diabetic polyneuropathy, with long-term current use of insulin ([place]) 08/22/2015   Asthma 08/22/2015   Lyme disease 06/05/1992   Pura Spice, PT, DPT # [id number] 06/03/2022, 7:56 AM  [facility] [address] [place], [place], [address] Phone: [phone]   Fax:  [phone]  Name: BON DOWIS MRN: [id number] Date of Birth: 02/23/1961

## 2022-06-04 ENCOUNTER — Encounter: Payer: Self-pay | Admitting: Family Medicine

## 2022-06-04 ENCOUNTER — Ambulatory Visit (INDEPENDENT_AMBULATORY_CARE_PROVIDER_SITE_OTHER): Payer: Medicare Other | Admitting: Family Medicine

## 2022-06-04 VITALS — BP 102/60 | HR 91 | Resp 16 | Ht 67.0 in | Wt 184.0 lb

## 2022-06-04 DIAGNOSIS — I255 Ischemic cardiomyopathy: Secondary | ICD-10-CM

## 2022-06-04 DIAGNOSIS — G7000 Myasthenia gravis without (acute) exacerbation: Secondary | ICD-10-CM

## 2022-06-04 DIAGNOSIS — E1142 Type 2 diabetes mellitus with diabetic polyneuropathy: Secondary | ICD-10-CM | POA: Diagnosis not present

## 2022-06-04 DIAGNOSIS — Z0289 Encounter for other administrative examinations: Secondary | ICD-10-CM

## 2022-06-04 DIAGNOSIS — E063 Autoimmune thyroiditis: Secondary | ICD-10-CM

## 2022-06-04 DIAGNOSIS — M4697 Unspecified inflammatory spondylopathy, lumbosacral region: Secondary | ICD-10-CM

## 2022-06-04 DIAGNOSIS — I700 Atherosclerosis of aorta: Secondary | ICD-10-CM

## 2022-06-04 DIAGNOSIS — I25118 Atherosclerotic heart disease of native coronary artery with other forms of angina pectoris: Secondary | ICD-10-CM

## 2022-06-04 DIAGNOSIS — E441 Mild protein-calorie malnutrition: Secondary | ICD-10-CM

## 2022-06-04 DIAGNOSIS — Z23 Encounter for immunization: Secondary | ICD-10-CM | POA: Diagnosis not present

## 2022-06-04 DIAGNOSIS — I5042 Chronic combined systolic (congestive) and diastolic (congestive) heart failure: Secondary | ICD-10-CM

## 2022-06-04 DIAGNOSIS — I200 Unstable angina: Secondary | ICD-10-CM

## 2022-06-04 DIAGNOSIS — Z794 Long term (current) use of insulin: Secondary | ICD-10-CM

## 2022-06-05 ENCOUNTER — Encounter: Payer: Self-pay | Admitting: Cardiovascular Disease

## 2022-06-05 ENCOUNTER — Telehealth: Payer: Self-pay

## 2022-06-05 ENCOUNTER — Other Ambulatory Visit: Payer: Self-pay

## 2022-06-05 ENCOUNTER — Other Ambulatory Visit
Admission: RE | Admit: 2022-06-05 | Discharge: 2022-06-05 | Disposition: A | Discharge status: Home / Self Care | Payer: Medicare Other | Attending: Nurse Practitioner | Admitting: Nurse Practitioner

## 2022-06-05 DIAGNOSIS — I2510 Atherosclerotic heart disease of native coronary artery without angina pectoris: Secondary | ICD-10-CM | POA: Insufficient documentation

## 2022-06-05 DIAGNOSIS — I5020 Unspecified systolic (congestive) heart failure: Secondary | ICD-10-CM

## 2022-06-05 DIAGNOSIS — I255 Ischemic cardiomyopathy: Secondary | ICD-10-CM

## 2022-06-05 LAB — CBC
HCT: 42.5 % (ref 39.0–52.0)
Hemoglobin: 13.8 g/dL (ref 13.0–17.0)
MCH: 29.4 pg (ref 26.0–34.0)
MCHC: 32.5 g/dL (ref 30.0–36.0)
MCV: 90.6 fL (ref 80.0–100.0)
Platelets: 155 10*3/uL (ref 150–400)
RBC: 4.69 MIL/uL (ref 4.22–5.81)
RDW: 13.4 % (ref 11.5–15.5)
WBC: 5.3 10*3/uL (ref 4.0–10.5)
nRBC: 0 % (ref 0.0–0.2)

## 2022-06-05 LAB — BASIC METABOLIC PANEL
Anion gap: 7 (ref 5–15)
BUN: 26 mg/dL — ABNORMAL HIGH (ref 8–23)
CO2: 27 mmol/L (ref 22–32)
Calcium: 9.5 mg/dL (ref 8.9–10.3)
Chloride: 106 mmol/L (ref 98–111)
Creatinine, Ser: 1.57 mg/dL — ABNORMAL HIGH (ref 0.61–1.24)
GFR, Estimated: 50 mL/min — ABNORMAL LOW (ref >60)
Glucose, Bld: 229 mg/dL — ABNORMAL HIGH (ref 70–99)
Potassium: 4.1 mmol/L (ref 3.5–5.1)
Sodium: 140 mmol/L (ref 135–145)

## 2022-06-05 LAB — TSH: TSH: 1.015 u[IU]/mL (ref 0.350–4.500)

## 2022-06-05 MED ORDER — CLOPIDOGREL BISULFATE 75 MG PO TABS: ORAL_TABLET | ORAL | 6 refills | Status: DC

## 2022-06-05 NOTE — Telephone Encounter (Signed)
Pt sent MyChart message - replied w/ Chris's recommendation.

## 2022-06-05 NOTE — Telephone Encounter (Signed)
Left message for pt to call back  °

## 2022-06-05 NOTE — Telephone Encounter (Signed)
Patient returning call.

## 2022-06-05 NOTE — Telephone Encounter (Signed)
-----   Message from Theora Gianotti, NP sent at 06/05/2022 12:46 PM EDT ----- Kidneys are looking dry with mild elevation of BUN/creatinine (26/1.57).  Blood counts and TSH are normal.  He is already only taking Lasix about once or twice per week and so will need to be more judicious with usage and also ensure adequate hydration throughout the day.  Vania Rea may also contribute to this but before stopping, would like to see if things stabilize with good hydration first.  He should have a follow-up basic metabolic panel in 2 weeks or so to reevaluate.

## 2022-06-11 ENCOUNTER — Encounter: Payer: Self-pay | Admitting: Family Medicine

## 2022-06-11 NOTE — Progress Notes (Signed)
Name: [name]   MRN: [id number]    DOB: 1960/10/30   Date:06/12/2022       Progress Note  Subjective  Chief Complaint  Sore on Arm  HPI  Spot on left arm: he states he noticed a dark spot on his left upper arm over the past 2 days, not growing, no pain or burning, denies itching. 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 ([place]) 12/02/2021   Gastropathy 08/13/2021   Iron deficiency anemia due to chronic blood loss    Pain due to onychomycosis of toenails of both feet 11/11/2020   Lung involvement associated with another disorder ([place]) 05/17/2020   Lower urinary tract symptoms 09/15/2019   Incomplete bladder emptying 09/15/2019   Carpal tunnel syndrome on both sides 07/12/2019   Idiopathic peripheral neuropathy 04/21/2019   Sensory ataxia 03/09/2019   OSA on CPAP 07/08/2018   Congestive heart failure (CHF) ([place]) 05/05/2018   Unstable angina ([place]) 04/26/2018   Ischemic cardiomyopathy 04/26/2018   Chronic combined systolic and diastolic CHF (congestive heart failure) ([place]) 04/26/2018   Anginal equivalent ([place]) 04/25/2018   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[place]) 01/25/2018   Vitamin D deficiency, unspecified 08/12/2017   Ptosis of left eyelid 01/08/2017   Dermatitis 12/24/2016   Benign neoplasm of sigmoid colon    Benign neoplasm of descending colon    Benign neoplasm of transverse colon    Coronary artery disease involving native coronary artery with angina pectoris ([place]) 10/22/2015   Coronary artery disease 08/22/2015   Myasthenia gravis ([place]) 08/22/2015   Chronic left-sided low back pain 08/22/2015   Hypertension 08/22/2015   GERD (gastroesophageal reflux disease) 08/22/2015   Hyperlipidemia 08/22/2015   Insomnia 08/22/2015   Type 2 diabetes mellitus with  diabetic polyneuropathy, with long-term current use of insulin ([place]) 08/22/2015   Asthma 08/22/2015   Lyme disease 06/05/1992    Past Surgical History:  Procedure Laterality Date   BILATERAL CARPAL TUNNEL RELEASE Bilateral L in 2012 and R in 2013   [place], most recent in  March 2016.   COLONOSCOPY WITH PROPOFOL N/A 01/10/2016   Procedure: COLONOSCOPY WITH PROPOFOL;  Surgeon: [name], MD;  Location: [facility];  Service: Endoscopy;  Laterality: N/A;   COLONOSCOPY WITH PROPOFOL N/A 04/14/2021   Procedure: COLONOSCOPY WITH PROPOFOL;  Surgeon: [name], MD;  Location: [facility];  Service: Endoscopy;  Laterality: N/A;  Diabetic - insulin and oral meds   CORONARY ANGIOPLASTY     CORONARY STENT INTERVENTION N/A 04/25/2018   Procedure: CORONARY STENT INTERVENTION;  Surgeon: [name], MD;  Location: [place] [facility];  Service: Cardiovascular;  Laterality: N/A;   ESOPHAGOGASTRODUODENOSCOPY (EGD) WITH PROPOFOL N/A 01/10/2016   Procedure: ESOPHAGOGASTRODUODENOSCOPY (EGD) WITH PROPOFOL;  Surgeon: [name], MD;  Location: [facility];  Service: Endoscopy;  Laterality: N/A;   ESOPHAGOGASTRODUODENOSCOPY (EGD) WITH PROPOFOL N/A 04/14/2021   Procedure: ESOPHAGOGASTRODUODENOSCOPY (EGD) WITH BIOPSY;  Surgeon: [name], MD;  Location: [facility];  Service: Endoscopy;  Laterality: N/A;   EYE SURGERY Bilateral 2012   cataract/bilateral vitrectomies   GIVENS CAPSULE STUDY N/A 08/20/2021   Procedure: GIVENS CAPSULE STUDY;  Surgeon: [name], MD;  Location: [facility];  Service: Endoscopy;  Laterality: N/A;   LEFT HEART CATH  AND CORONARY ANGIOGRAPHY Left 04/25/2018   Procedure: LEFT HEART CATH AND CORONARY ANGIOGRAPHY;  Surgeon: [name], MD;  Location: [place];  Service: Cardiovascular;  Laterality: Left;   LEFT HEART CATH AND CORONARY ANGIOGRAPHY Left 02/02/2022   Procedure: LEFT HEART CATH AND CORONARY  ANGIOGRAPHY;  Surgeon: [name], MD;  Location: [place] [facility];  Service: Cardiovascular;  Laterality: Left;   TEE WITHOUT CARDIOVERSION N/A 09/05/2018   Procedure: TRANSESOPHAGEAL ECHOCARDIOGRAM (TEE);  Surgeon: [name], MD;  Location: [facility];  Service: Cardiovascular;  Laterality: N/A;   TONSILLECTOMY AND ADENOIDECTOMY     As a child   TUNNELED VENOUS CATHETER PLACEMENT     removed    Family History  Problem Relation Age of Onset   Diabetes Mother    Heart disease Mother    Cancer Father        Prostate CA, Anal cancer    Dementia Father    Diabetes Brother    Healthy Brother    Healthy Brother     Social History   Tobacco Use   Smoking status: Never   Smokeless tobacco: Never   Tobacco comments:    smoking cessation materials not required  Substance Use Topics   Alcohol use: Not Currently    Alcohol/week: 0.0 standard drinks of alcohol     Current Outpatient Medications:    albuterol (PROVENTIL) (2.5 MG/3ML) 0.083% nebulizer solution, Take 3 mLs (2.5 mg total) by nebulization every 6 (six) hours as needed for wheezing or shortness of breath., Disp: 75 mL, Rfl: 2   albuterol (VENTOLIN HFA) 108 (90 Base) MCG/ACT inhaler, Inhale 2 puffs into the lungs daily., Disp: , Rfl:    ammonium lactate (AMLACTIN) 12 % lotion, Apply 1 application topically as needed for dry skin., Disp: 400 g, Rfl: 3   aspirin EC 81 MG tablet, Take 1 tablet (81 mg total) by mouth daily., Disp: 90 tablet, Rfl: 3   clopidogrel (PLAVIX) 75 MG tablet, Take 1 tablet (75 mg) by mouth daily with breakfast, Disp: 30 tablet, Rfl: 6   cyclobenzaprine (FLEXERIL) 10 MG tablet, TAKE 1 TABLET(10 MG) BY MOUTH TWICE DAILY, Disp: 60 tablet, Rfl: 5   dexlansoprazole (DEXILANT) 60 MG capsule, Take 1 capsule (60 mg total) by mouth daily., Disp: 30 capsule, Rfl: 11   DULoxetine (CYMBALTA) 30 MG capsule, Take 1 capsule (30 mg total) by mouth daily., Disp: 90 capsule, Rfl: 1   DULoxetine (CYMBALTA)  60 MG capsule, Take 1 capsule (60 mg total) by mouth daily., Disp: 90 capsule, Rfl: 1   ezetimibe (ZETIA) 10 MG tablet, TAKE 1 TABLET(10 MG) BY MOUTH DAILY, Disp: 90 tablet, Rfl: 0   fluticasone furoate-vilanterol (BREO ELLIPTA) 200-25 MCG/ACT AEPB, Inhale 1 puff into the lungs daily., Disp: , Rfl:    furosemide (LASIX) 20 MG tablet, TAKE 1 TABLET BY MOUTH DAILY AS NEEDED FOR WEIGHT GAIN OF 2 POUNDS OVERNIGHT OR 5 POUNDS PER WEEK, Disp: 90 tablet, Rfl: 0   gabapentin (NEURONTIN) 300 MG capsule, Take 1 capsule (300 mg total) by mouth 3 (three) times daily., Disp: 90 capsule, Rfl: 1   gabapentin (NEURONTIN) 800 MG tablet, Take 800 mg by mouth at bedtime., Disp: , Rfl:    Humidifier/Vaporizer Supplies (VICKS VAPORIZER SCENT) PADS, Apply 1 application. topically daily as needed (cough)., Disp: , Rfl:    insulin lispro (HUMALOG) 100 UNIT/ML KiwkPen, Inject 7-8 Units into the skin 3 (three) times daily. Reported on 12/02/2015/ sliding scale 1 unit  for every 8 units of carbs; and 1 unit for every 20 above 120, Disp: , Rfl:    Insulin Pen Needle 32G X 4 MM MISC, Inject 1 each into the skin as needed., Disp: , Rfl:    JARDIANCE 10 MG TABS tablet, TAKE 1 TABLET(10 MG) BY MOUTH DAILY BEFORE BREAKFAST, Disp: 30 tablet, Rfl: 4   LANTUS SOLOSTAR 100 UNIT/ML Solostar Pen, Inject 20-26 Units into the skin daily at 10 pm. Sliding scale based on blood glucose, Disp: , Rfl: 0   levocetirizine (XYZAL) 5 MG tablet, TAKE 1 TABLET(5 MG) BY MOUTH EVERY EVENING, Disp: 90 tablet, Rfl: 1   levothyroxine (SYNTHROID) 50 MCG tablet, TAKE 1 TABLET BY MOUTH DAILY MONDAY THROUGH SATURDAY AND 2 TABLETS EVERY SUNDAY. TAKE BEFORE BREAKFAST, Disp: 102 tablet, Rfl: 0   loratadine (CLARITIN) 10 MG tablet, Take 10 mg by mouth in the morning., Disp: , Rfl:    metFORMIN (GLUCOPHAGE) 1000 MG tablet, Take 1,000 mg by mouth 2 (two) times daily with a meal., Disp: , Rfl:    montelukast (SINGULAIR) 10 MG tablet, Take 10 mg by mouth at bedtime.,  Disp: , Rfl:    Multiple Vitamins-Minerals (MENS MULTI VITAMIN & MINERAL PO), Take 1 tablet by mouth in the morning and at bedtime., Disp: , Rfl:    naloxone (NARCAN) nasal spray 4 mg/0.1 mL, For excess sedation from opioids, Disp: 1 kit, Rfl: 2   nitroGLYCERIN (NITROSTAT) 0.3 MG SL tablet, DISSOLVE 1 TABLET UNDER THE TONGUE EVERY 5 MINUTES AS NEEDED FOR CHEST PAIN. MAXIMUM 3 DOSES., Disp: 100 tablet, Rfl: 3   [START ON 06/27/2022] Oxycodone HCl 10 MG TABS, Take 1 tablet (10 mg total) by mouth in the morning, at noon, in the evening, and at bedtime., Disp: 120 tablet, Rfl: 0   Oxycodone HCl 10 MG TABS, Take 1 tablet (10 mg total) by mouth every 6 (six) hours., Disp: 120 tablet, Rfl: 0   rosuvastatin (CRESTOR) 5 MG tablet, Take 5 mg by mouth daily with supper., Disp: , Rfl: 0   Semaglutide, 2 MG/DOSE, 8 MG/3ML SOPN, Inject 1 each into the skin once a week., Disp: , Rfl:   Current Facility-Administered Medications:    sodium chloride flush (NS) 0.9 % injection 3 mL, 3 mL, Intravenous, Q12H, Furth, Cadence H, PA-C  Allergies  Allergen Reactions   Azathioprine Other (See Comments)    Azathioprine hypersensitivity reaction - Symptoms mimicking sepsis - was hospitalized   Novolog [Insulin Aspart] Hives    I personally reviewed active problem list, medication list, allergies, family history, social history, health maintenance with the patient/caregiver today.   ROS  Ten systems reviewed and is negative except as mentioned in HPI   Objective  Vitals:   06/12/22 1042  BP: 112/64  Pulse: 93  Resp: 16  SpO2: 99%  Weight: 184 lb (83.5 kg)  Height: $Remove'5\' 7"'UWtPxsB$  (1.702 m)    Body mass index is 28.82 kg/m.  Physical Exam  Constitutional: Patient appears well-developed and well-nourished.  No distress.  HEENT: head atraumatic, normocephalic, pupils equal and reactive to light, neck supple Cardiovascular: Normal rate, regular rhythm and normal heart sounds.  No murmur heard. No BLE  edema. Pulmonary/Chest: end expiratory wheezing, less than last visit  No respiratory distress. Abdominal: Soft.  There is no tenderness. Skin: picture attached, it looks like a small hematoma Psychiatric: Patient has a normal mood and affect. behavior is normal. Judgment and thought content normal.   Recent Results (from the past 2160 hour(s))  Hemoglobin A1c     Status: None   Collection Time: 03/24/22 12:00 AM  Result Value Ref Range   Hemoglobin A1C 7.8   TSH     Status: None   Collection Time: 06/05/22 10:11 AM  Result Value Ref Range   TSH 1.015 0.350 - 4.500 uIU/mL    Comment: Performed by a 3rd Generation assay with a functional sensitivity of <=0.01 uIU/mL. Performed at [facility], [address] [place], [place] [address]   Basic metabolic panel     Status: Abnormal   Collection Time: 06/05/22 10:11 AM  Result Value Ref Range   Sodium 140 135 - 145 mmol/L   Potassium 4.1 3.5 - 5.1 mmol/L   Chloride 106 98 - 111 mmol/L   CO2 27 22 - 32 mmol/L   Glucose, Bld 229 (H) 70 - 99 mg/dL    Comment: Glucose reference range applies only to samples taken after fasting for at least 8 hours.   BUN 26 (H) 8 - 23 mg/dL   Creatinine, Ser 1.57 (H) 0.61 - 1.24 mg/dL   Calcium 9.5 8.9 - 10.3 mg/dL   GFR, Estimated 50 (L) >60 mL/min    Comment: (NOTE) Calculated using the CKD-EPI Creatinine Equation (2021)    Anion gap 7 5 - 15    Comment: Performed at [facility], [address] [place], [place] [address]  CBC     Status: None   Collection Time: 06/05/22 10:11 AM  Result Value Ref Range   WBC 5.3 4.0 - 10.5 K/uL   RBC 4.69 4.22 - 5.81 MIL/uL   Hemoglobin 13.8 13.0 - 17.0 g/dL   HCT 42.5 39.0 - 52.0 %   MCV 90.6 80.0 - 100.0 fL   MCH 29.4 26.0 - 34.0 pg   MCHC 32.5 30.0 - 36.0 g/dL   RDW 13.4 11.5 - 15.5 %   Platelets 155 150 - 400 K/uL   nRBC 0.0 0.0 - 0.2 %    Comment: Performed at [facility] [address] [place], [place] [address]      PHQ2/9:    06/12/2022   10:43 AM 06/04/2022   10:02 AM 04/17/2022    1:45 PM 04/17/2022    1:37 PM 02/17/2022   11:26 AM  Depression screen PHQ 2/9  Decreased Interest 0 1 0 0 1  Down, Depressed, Hopeless 1 1 2 2  0  PHQ - 2 Score 1 2 2 2 1   Altered sleeping 3 3 3  0 0  Tired, decreased energy 3 0 0 0 1  Change in appetite 0 3 2 0 0  Feeling bad or failure about yourself  0 0 0 0 0  Trouble concentrating 0 0 0 0 0  Moving slowly or fidgety/restless 0 0 0 0 0  Suicidal thoughts 0 0 0 0 0  PHQ-9 Score 7 8 7 2 2   Difficult doing work/chores   Somewhat difficult Not difficult at all Not difficult at all    phq 9 is positive   Fall Risk:    06/12/2022   10:37 AM 06/04/2022    9:51 AM 05/11/2022    8:11 AM 04/17/2022    1:37 PM 02/17/2022   11:25 AM  Fall Risk   Falls in the past year? 1 1 0 1 1  Number falls in past yr: 1 1  1 1   Injury with Fall? 0 0  1 1  Risk for fall due to : Impaired mobility;Impaired balance/gait Impaired  balance/gait;Impaired mobility  Impaired balance/gait;Impaired mobility History of fall(s)  Follow up Falls prevention discussed Falls prevention discussed   Falls prevention discussed;Education provided;Falls evaluation completed      Functional Status Survey: Is the patient deaf or have difficulty hearing?: Yes Does the patient have difficulty seeing, even when wearing glasses/contacts?: Yes Does the patient have difficulty concentrating, remembering, or making decisions?: No Does the patient have difficulty walking or climbing stairs?: Yes Does the patient have difficulty dressing or bathing?: No Does the patient have difficulty doing errands alone such as visiting a doctor's office or shopping?: Yes    Assessment & Plan  1. Arm skin lesion, left   Discussed likely benign and may be just a small bruise but to monitor and if it grows or does not resolve in the next couple of weeks he should reach back to [place]

## 2022-06-12 ENCOUNTER — Encounter: Payer: Self-pay | Admitting: Family Medicine

## 2022-06-12 ENCOUNTER — Ambulatory Visit (INDEPENDENT_AMBULATORY_CARE_PROVIDER_SITE_OTHER): Payer: Medicare Other | Admitting: Family Medicine

## 2022-06-12 VITALS — BP 112/64 | HR 93 | Resp 16 | Ht 67.0 in | Wt 184.0 lb

## 2022-06-12 DIAGNOSIS — L989 Disorder of the skin and subcutaneous tissue, unspecified: Secondary | ICD-10-CM

## 2022-06-24 LAB — BASIC METABOLIC PANEL
BUN: 22 — AB (ref 4–21)
CO2: 33 — AB (ref 13–22)
Chloride: 103 (ref 99–108)
Creatinine: 1.2 (ref <=1.3)
Glucose: 182
Potassium: 4.5 mEq/L (ref 3.5–5.1)
Sodium: 141 (ref 137–147)

## 2022-06-24 LAB — TSH: TSH: 0.49 (ref <=5.90)

## 2022-06-24 LAB — HEPATIC FUNCTION PANEL
ALT: 14 U/L (ref 10–40)
AST: 16 (ref 14–40)
Alkaline Phosphatase: 66 (ref 25–125)
Bilirubin, Total: 0.6

## 2022-06-24 LAB — COMPREHENSIVE METABOLIC PANEL
Albumin: 3.9 (ref 3.5–5.0)
Calcium: 9.6 (ref 8.7–10.7)
eGFR: 61

## 2022-06-24 LAB — LIPID PANEL
Cholesterol: 106 (ref 0–200)
HDL: 39 (ref 35–70)
LDL Cholesterol: 40
LDl/HDL Ratio: 2.7
Triglycerides: 133 (ref 40–160)

## 2022-06-24 LAB — HEMOGLOBIN A1C: Hemoglobin A1C: 7.7

## 2022-06-26 ENCOUNTER — Telehealth: Payer: Self-pay

## 2022-06-26 ENCOUNTER — Inpatient Hospital Stay: Payer: Medicare Other | Attending: Family Medicine

## 2022-06-26 NOTE — Telephone Encounter (Signed)
Pt no showed to labs today. Please r/s appts and inform pt (non urgent)   Labs MD 1-2 days AFTER labs

## 2022-06-27 ENCOUNTER — Other Ambulatory Visit: Payer: Self-pay | Admitting: Cardiovascular Disease

## 2022-06-27 DIAGNOSIS — I5022 Chronic systolic (congestive) heart failure: Secondary | ICD-10-CM

## 2022-06-29 ENCOUNTER — Encounter: Payer: Self-pay | Admitting: Oncology

## 2022-06-29 ENCOUNTER — Inpatient Hospital Stay: Payer: Medicare Other | Admitting: Oncology

## 2022-06-29 ENCOUNTER — Telehealth: Payer: Self-pay | Admitting: Gastroenterology

## 2022-06-29 MED ORDER — DEXLANSOPRAZOLE 60 MG PO CPDR: 1.0000 | DELAYED_RELEASE_CAPSULE | Freq: Every day | ORAL | 4 refills | Status: DC

## 2022-06-29 NOTE — Addendum Note (Signed)
Addended by: Lurlean Nanny on: 06/29/2022 12:13 PM   Modules accepted: Orders

## 2022-06-29 NOTE — Telephone Encounter (Signed)
Pt need refill on Dexilant. I will make f/u appt for pt also.

## 2022-06-29 NOTE — Telephone Encounter (Signed)
Rx sent through e-scribe  

## 2022-07-02 ENCOUNTER — Encounter: Payer: Self-pay | Admitting: Nurse Practitioner

## 2022-07-02 ENCOUNTER — Ambulatory Visit: Payer: Self-pay | Admitting: *Deleted

## 2022-07-02 ENCOUNTER — Ambulatory Visit (INDEPENDENT_AMBULATORY_CARE_PROVIDER_SITE_OTHER): Payer: Medicare Other | Admitting: Nurse Practitioner

## 2022-07-02 VITALS — BP 124/76 | HR 96 | Temp 98.2°F | Resp 18 | Ht 67.0 in | Wt 196.2 lb

## 2022-07-02 DIAGNOSIS — R0989 Other specified symptoms and signs involving the circulatory and respiratory systems: Secondary | ICD-10-CM | POA: Diagnosis not present

## 2022-07-02 DIAGNOSIS — J4540 Moderate persistent asthma, uncomplicated: Secondary | ICD-10-CM

## 2022-07-02 MED ORDER — PREDNISONE 10 MG (21) PO TBPK: ORAL_TABLET | ORAL | 0 refills | Status: DC

## 2022-07-02 MED ORDER — AZITHROMYCIN 250 MG PO TABS: ORAL_TABLET | ORAL | 0 refills | Status: DC

## 2022-07-02 NOTE — Telephone Encounter (Signed)
Reason for Disposition  [1] Longstanding difficulty breathing (e.g., CHF, COPD, emphysema) AND [2] WORSE than normal  Answer Assessment - Initial Assessment Questions 1. RESPIRATORY STATUS: "Describe your breathing?" (e.g., wheezing, shortness of breath, unable to speak, severe coughing)      Congestion- cough- allergies 2. ONSET: "When did this breathing problem begin?"      2 weeks- 3-4 days 3. PATTERN "Does the difficult breathing come and go, or has it been constant since it started?"      constant 4. SEVERITY: "How bad is your breathing?" (e.g., mild, moderate, severe)    - MILD: No SOB at rest, mild SOB with walking, speaks normally in sentences, can lie down, no retractions, pulse < 100.    - MODERATE: SOB at rest, SOB with minimal exertion and prefers to sit, cannot lie down flat, speaks in phrases, mild retractions, audible wheezing, pulse 100-120.    - SEVERE: Very SOB at rest, speaks in single words, struggling to breathe, sitting hunched forward, retractions, pulse > 120      Laying down 5. RECURRENT SYMPTOM: "Have you had difficulty breathing before?" If Yes, ask: "When was the last time?" and "What happened that time?"      Frequent sinus symptoms 6. CARDIAC HISTORY: "Do you have any history of heart disease?" (e.g., heart attack, angina, bypass surgery, angioplasty)      no 7. LUNG HISTORY: "Do you have any history of lung disease?"  (e.g., pulmonary embolus, asthma, emphysema)     Asthma- will use nebulizer 8. CAUSE: "What do you think is causing the breathing problem?"      sinus 9. OTHER SYMPTOMS: "Do you have any other symptoms? (e.g., dizziness, runny nose, cough, chest pain, fever)     Cough,dizziness- if stands too fast 10. O2 SATURATION MONITOR:  "Do you use an oxygen saturation monitor (pulse oximeter) at home?" If Yes, ask: "What is your reading (oxygen level) today?" "What is your usual oxygen saturation reading?" (e.g., 95%)         11. PREGNANCY: "Is there any  chance you are pregnant?" "When was your last menstrual period?"         12. TRAVEL: "Have you traveled out of the country in the last month?" (e.g., travel history, exposures)  Protocols used: Breathing Difficulty-A-AH

## 2022-07-02 NOTE — Assessment & Plan Note (Signed)
Continue using albuterol inhaler as needed.  Continue taking Singulair and Breo inhaler.  May use nebulizer as needed.

## 2022-07-02 NOTE — Telephone Encounter (Signed)
  Chief Complaint: sinus symptoms- SOB Symptoms: congestion- green mucus, SOB when lays down, cough Frequency: 2 weeks- worse last 3-4 days Pertinent Negatives: Patient denies fever Disposition: '[]'$ ED /'[]'$ Urgent Care (no appt availability in office) / '[x]'$ Appointment(In office/virtual)/ '[]'$  [facility]/ '[]'$ Home Care/ '[]'$ Refused Recommended Disposition /'[]'$ [facility]/ '[]'$  Follow-up with PCP Additional Notes: Patient states he has history of asthma and feels sinus infection is triggering. He will use his nebulizer and measure O2 sat. Patient advised to call back if he has worsening symptoms

## 2022-07-02 NOTE — Progress Notes (Signed)
BP 124/76   Pulse 96   Temp 98.2 F (36.8 C)   Resp 18   Ht '5\' 7"'$  (1.702 m)   Wt 196 lb 3.2 oz (89 kg)   SpO2 98%   BMI 30.73 kg/m    Subjective:    Patient ID: [name], male    DOB: 05/13/1961, 61 y.o.   MRN: [id number]  HPI: [name] is a 61 y.o. male  Chief Complaint  Patient presents with   Nasal Congestion    X4 days   URI: Patient reports symptoms started four days ago.  Symptoms include chest congestion, and cough. Patient reports he is producing green sputum.   Patient reports he has taken OTC cold medication.  Patient reports he gets this about two times a year and always needs antibiotics and steroids. Patient denies any fever. He says he does have shortness of breath but not significant. Patient has a pulmonologist, Dr. Raul Del.  Last seen on 03/30/2022.  We will get COVID PCR.  We will go ahead and send in Z-Pak and steroid taper pack.    Asthma: Patient is currently taking Singulair 10 mg daily, Breo. Ellipta 1 puff daily, and albuterol as needed.Patient has a pulmonologist, Dr. Raul Del.  Last seen on 03/30/2022.  Relevant past medical, surgical, family and social history reviewed and updated as indicated. Interim medical history since our last visit reviewed. Allergies and medications reviewed and updated.  Review of Systems  Constitutional: Negative for fever or weight change.  Respiratory: Positive for cough and shortness of breath.   Cardiovascular: Negative for chest pain or palpitations.  Gastrointestinal: Negative for abdominal pain, no bowel changes.  Musculoskeletal: Negative for gait problem or joint swelling.  Skin: Negative for rash.  Neurological: Negative for dizziness or headache.  No other specific complaints in a complete review of systems (except as listed in HPI above).      Objective:    BP 124/76   Pulse 96   Temp 98.2 F (36.8 C)   Resp 18   Ht '5\' 7"'$  (1.702 m)   Wt 196 lb 3.2 oz (89 kg)   SpO2 98%   BMI 30.73  kg/m   Wt Readings from Last 3 Encounters:  07/02/22 196 lb 3.2 oz (89 kg)  06/12/22 184 lb (83.5 kg)  06/04/22 184 lb (83.5 kg)    Physical Exam  Constitutional: Patient appears well-developed and well-nourished. Obese  No distress.  HEENT: head atraumatic, normocephalic, pupils equal and reactive to light, neck supple Cardiovascular: Normal rate, regular rhythm and normal heart sounds.  No murmur heard. No BLE edema. Pulmonary/Chest: Effort normal and breath sounds wheezing. No respiratory distress. Abdominal: Soft.  There is no tenderness. Psychiatric: Patient has a normal mood and affect. behavior is normal. Judgment and thought content normal.  Results for orders placed or performed during the hospital encounter of 06/05/22  TSH  Result Value Ref Range   TSH 1.015 0.350 - 4.500 uIU/mL  Basic metabolic panel  Result Value Ref Range   Sodium 140 135 - 145 mmol/L   Potassium 4.1 3.5 - 5.1 mmol/L   Chloride 106 98 - 111 mmol/L   CO2 27 22 - 32 mmol/L   Glucose, Bld 229 (H) 70 - 99 mg/dL   BUN 26 (H) 8 - 23 mg/dL   Creatinine, Ser 1.57 (H) 0.61 - 1.24 mg/dL   Calcium 9.5 8.9 - 10.3 mg/dL   GFR, Estimated 50 (L) >60 mL/min   Anion  gap 7 5 - 15  CBC  Result Value Ref Range   WBC 5.3 4.0 - 10.5 K/uL   RBC 4.69 4.22 - 5.81 MIL/uL   Hemoglobin 13.8 13.0 - 17.0 g/dL   HCT 42.5 39.0 - 52.0 %   MCV 90.6 80.0 - 100.0 fL   MCH 29.4 26.0 - 34.0 pg   MCHC 32.5 30.0 - 36.0 g/dL   RDW 13.4 11.5 - 15.5 %   Platelets 155 150 - 400 K/uL   nRBC 0.0 0.0 - 0.2 %      Assessment & Plan:   Problem List Items Addressed This Visit       Respiratory   Asthma    Continue using albuterol inhaler as needed.  Continue taking Singulair and Breo inhaler.  May use nebulizer as needed.      Relevant Medications   predniSONE (STERAPRED UNI-PAK 21 TAB) 10 MG (21) TBPK tablet   Other Visit Diagnoses     Chest congestion    -  Primary   Start Z-Pak and steroid taper pack.  Continue  over-the-counter cold medication.  Recommend Mucinex.    Relevant Medications   predniSONE (STERAPRED UNI-PAK 21 TAB) 10 MG (21) TBPK tablet   azithromycin (ZITHROMAX) 250 MG tablet   Other Relevant Orders   Novel Coronavirus, NAA ([facility])        Follow up plan: Return if symptoms worsen or fail to improve.

## 2022-07-03 LAB — NOVEL CORONAVIRUS, NAA: SARS-CoV-2, NAA: NOT DETECTED

## 2022-07-10 ENCOUNTER — Other Ambulatory Visit: Payer: Self-pay | Admitting: Medical

## 2022-07-17 NOTE — Progress Notes (Unsigned)
Name: [name]   MRN: [id number]    DOB: 1961/01/28   Date:07/20/2022       Progress Note  Subjective  Chief Complaint  Follow Up  HPI  Asthma :he had 5 rounds of prednisone this year, last treatment with antibiotics and prednisone was 06/2022 due to bronchitis , he is under the care of Dr. Raul Del, wheezing but has SOB with activity - back to baseline, still has some chest congestion but cough has also improved  . Currently on Breo and albuterol prn . Cannot afford Trelegy    DMII:  he is under the care of Dr. Honor Junes , A1C was 8.4 % was down to 7.9 % went up to  8.3 %  7.6 % last one in June was 7.8 % lat visit with Endo was Sept and A1C was down to 7.7 %  He has associated HTN, dyslipidemia, neuropathy.  He is on statin therapy, ARB and also gabapentin. Taking insulin, Ozempic ,  Metformin , SGL2 agonist.    Hypothyroidism: he is currently on 50 mcg of levothyroxine but two on Sundays , he has  constipation but likely secondary to oxydocone  but better with stool softener TSH has been at goal    Malnutrition: he lost 21 lbs in the past year, that is a 10 % weight loss in one year. He states he will try to increase calorie intake without increase too many carbs due to DM. He states eating full meals and taking one protein shake per day. Explained he needs to stop losing weight. His labs have been normal, seen by hematologist earlier this year, also under the care of Endo, cardiologist , pulmonologist. Insulin was adjusted recently due to hypoglycemia.   Chronic pain/inflammatory spondyloarthropathy:  he sees pain sub-specialist every 2 months. He is going to ask if he can switch from oxycodone to something longer acting. He has daily back pain  , he states lately pain is going down both legs and has numbness and weakness on both lower legs when he first stands up, he has an appointment with Dr. [name] this week and will discuss it with him  IMPRESSION: No findings of discitis or  osteomyelitis. Mild multilevel lumbar spondylosis, primarily facet arthropathy, with some disc space narrowing L2-L3.   Congenital lumbar stenosis with short pedicles, but no compressive features.  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[place]) 12/02/2021   Gastropathy 08/13/2021   Iron deficiency anemia due to chronic blood loss    Pain due to onychomycosis of toenails of both feet 11/11/2020   Lung involvement associated with another disorder ([place]) 05/17/2020   Lower urinary tract symptoms 09/15/2019   Incomplete bladder emptying 09/15/2019   Carpal tunnel syndrome on both sides 07/12/2019   Idiopathic peripheral neuropathy 04/21/2019   Sensory ataxia 03/09/2019   OSA on CPAP 07/08/2018   Congestive heart failure (CHF) ([place]) 05/05/2018   Unstable angina ([place]) 04/26/2018   Ischemic cardiomyopathy 04/26/2018   Chronic combined systolic and diastolic CHF (congestive heart failure) ([place]) 04/26/2018   Anginal equivalent 04/25/2018   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[place]) 01/25/2018   Vitamin D deficiency, unspecified 08/12/2017   Ptosis of left eyelid 01/08/2017   Dermatitis 12/24/2016  Benign neoplasm of sigmoid colon    Benign neoplasm of descending colon    Benign neoplasm of transverse colon    Coronary artery disease involving native coronary artery with angina pectoris ([place]) 10/22/2015   Coronary artery disease 08/22/2015   Myasthenia gravis ([place]) 08/22/2015   Chronic left-sided low back pain 08/22/2015   Hypertension 08/22/2015   GERD (gastroesophageal reflux disease) 08/22/2015   Hyperlipidemia 08/22/2015   Insomnia 08/22/2015   Type 2 diabetes mellitus with diabetic polyneuropathy, with  long-term current use of insulin ([place]) 08/22/2015   Asthma 08/22/2015   Lyme disease 06/05/1992    Past Surgical History:  Procedure Laterality Date   BILATERAL CARPAL TUNNEL RELEASE Bilateral L in 2012 and R in 2013   [place], most recent in  March 2016.   COLONOSCOPY WITH PROPOFOL N/A 01/10/2016   Procedure: COLONOSCOPY WITH PROPOFOL;  Surgeon: [name], MD;  Location: [facility];  Service: Endoscopy;  Laterality: N/A;   COLONOSCOPY WITH PROPOFOL N/A 04/14/2021   Procedure: COLONOSCOPY WITH PROPOFOL;  Surgeon: [name], MD;  Location: [place];  Service: Endoscopy;  Laterality: N/A;  Diabetic - insulin and oral meds   CORONARY ANGIOPLASTY     CORONARY STENT INTERVENTION N/A 04/25/2018   Procedure: CORONARY STENT INTERVENTION;  Surgeon: [name], MD;  Location: [place] [facility];  Service: Cardiovascular;  Laterality: N/A;   ESOPHAGOGASTRODUODENOSCOPY (EGD) WITH PROPOFOL N/A 01/10/2016   Procedure: ESOPHAGOGASTRODUODENOSCOPY (EGD) WITH PROPOFOL;  Surgeon: [name], MD;  Location: [facility];  Service: Endoscopy;  Laterality: N/A;   ESOPHAGOGASTRODUODENOSCOPY (EGD) WITH PROPOFOL N/A 04/14/2021   Procedure: ESOPHAGOGASTRODUODENOSCOPY (EGD) WITH BIOPSY;  Surgeon: [name], MD;  Location: [place];  Service: Endoscopy;  Laterality: N/A;   EYE SURGERY Bilateral 2012   cataract/bilateral vitrectomies   GIVENS CAPSULE STUDY N/A 08/20/2021   Procedure: GIVENS CAPSULE STUDY;  Surgeon: [name], MD;  Location: [facility];  Service: Endoscopy;  Laterality: N/A;   LEFT HEART CATH AND CORONARY ANGIOGRAPHY Left 04/25/2018   Procedure: LEFT HEART CATH AND CORONARY ANGIOGRAPHY;  Surgeon: [name], MD;  Location: [place] [facility];  Service: Cardiovascular;  Laterality: Left;   LEFT HEART CATH AND CORONARY ANGIOGRAPHY Left 02/02/2022   Procedure: LEFT HEART CATH AND CORONARY ANGIOGRAPHY;  Surgeon: [name], MD;  Location: [place] [facility];  Service: Cardiovascular;  Laterality: Left;   TEE WITHOUT CARDIOVERSION N/A 09/05/2018   Procedure: TRANSESOPHAGEAL ECHOCARDIOGRAM (TEE);  Surgeon: [name], MD;  Location: [facility];  Service: Cardiovascular;  Laterality: N/A;   TONSILLECTOMY AND ADENOIDECTOMY     As a child   TUNNELED VENOUS CATHETER PLACEMENT     removed    Family History  Problem Relation Age of Onset   Diabetes Mother    Heart disease Mother    Cancer Father        Prostate CA, Anal cancer    Dementia Father    Diabetes Brother    Healthy Brother    Healthy Brother     Social History   Tobacco Use   Smoking status: Never   Smokeless tobacco: Never   Tobacco comments:    smoking cessation materials not required  Substance Use Topics   Alcohol use: Not Currently    Alcohol/week: 0.0 standard drinks of alcohol     Current Outpatient Medications:    albuterol (PROVENTIL) (2.5 MG/3ML) 0.083% nebulizer solution, Take 3 mLs (2.5 mg total)  by nebulization every 6 (six) hours as needed for wheezing or shortness of breath., Disp: 75 mL, Rfl: 2   albuterol (VENTOLIN HFA) 108 (90 Base) MCG/ACT inhaler, Inhale 2 puffs into the lungs daily., Disp: , Rfl:    ammonium lactate (AMLACTIN) 12 % lotion, Apply 1 application topically as needed for dry skin., Disp: 400 g, Rfl: 3   aspirin EC 81 MG tablet, Take 1 tablet (81 mg total) by mouth daily., Disp: 90 tablet, Rfl: 3   clopidogrel (PLAVIX) 75 MG tablet, Take 1 tablet (75 mg) by mouth daily with breakfast, Disp: 30 tablet, Rfl: 6   cyclobenzaprine (FLEXERIL) 10 MG tablet, TAKE 1 TABLET(10 MG) BY MOUTH TWICE DAILY, Disp: 60 tablet, Rfl: 5   dexlansoprazole (DEXILANT) 60 MG capsule, Take 1 capsule (60 mg total) by mouth daily., Disp: 30 capsule, Rfl: 4   DULoxetine (CYMBALTA) 30 MG capsule, Take 1 capsule (30 mg total) by mouth daily., Disp: 90 capsule, Rfl: 1   DULoxetine (CYMBALTA) 60 MG capsule, Take 1 capsule  (60 mg total) by mouth daily., Disp: 90 capsule, Rfl: 1   ezetimibe (ZETIA) 10 MG tablet, TAKE 1 TABLET(10 MG) BY MOUTH DAILY, Disp: 90 tablet, Rfl: 0   fluticasone furoate-vilanterol (BREO ELLIPTA) 200-25 MCG/ACT AEPB, Inhale 1 puff into the lungs daily., Disp: , Rfl:    furosemide (LASIX) 20 MG tablet, TAKE 1 TABLET BY MOUTH DAILY AS NEEDED FOR WEIGHT GAIN OF 2 POUNDS OVERNIGHT OR 5 POUNDS PER WEEK, Disp: 90 tablet, Rfl: 0   gabapentin (NEURONTIN) 300 MG capsule, Take 1 capsule (300 mg total) by mouth 3 (three) times daily., Disp: 90 capsule, Rfl: 1   gabapentin (NEURONTIN) 800 MG tablet, Take 800 mg by mouth at bedtime., Disp: , Rfl:    Humidifier/Vaporizer Supplies (VICKS VAPORIZER SCENT) PADS, Apply 1 application. topically daily as needed (cough)., Disp: , Rfl:    insulin lispro (HUMALOG) 100 UNIT/ML KiwkPen, Inject 7-8 Units into the skin 3 (three) times daily. Reported on 12/02/2015/ sliding scale 1 unit for every 8 units of carbs; and 1 unit for every 20 above 120, Disp: , Rfl:    Insulin Pen Needle 32G X 4 MM MISC, Inject 1 each into the skin as needed., Disp: , Rfl:    JARDIANCE 10 MG TABS tablet, TAKE 1 TABLET(10 MG) BY MOUTH DAILY BEFORE BREAKFAST, Disp: 30 tablet, Rfl: 1   LANTUS SOLOSTAR 100 UNIT/ML Solostar Pen, Inject 20-26 Units into the skin daily at 10 pm. Sliding scale based on blood glucose, Disp: , Rfl: 0   levocetirizine (XYZAL) 5 MG tablet, TAKE 1 TABLET(5 MG) BY MOUTH EVERY EVENING, Disp: 90 tablet, Rfl: 1   levothyroxine (SYNTHROID) 50 MCG tablet, TAKE 1 TABLET BY MOUTH DAILY MONDAY THROUGH SATURDAY AND 2 TABLETS EVERY SUNDAY. TAKE BEFORE BREAKFAST, Disp: 102 tablet, Rfl: 0   loratadine (CLARITIN) 10 MG tablet, Take 10 mg by mouth in the morning., Disp: , Rfl:    metFORMIN (GLUCOPHAGE) 1000 MG tablet, Take 1,000 mg by mouth 2 (two) times daily with a meal., Disp: , Rfl:    montelukast (SINGULAIR) 10 MG tablet, Take 10 mg by mouth at bedtime., Disp: , Rfl:    Multiple  Vitamins-Minerals (MENS MULTI VITAMIN & MINERAL PO), Take 1 tablet by mouth in the morning and at bedtime., Disp: , Rfl:    naloxone (NARCAN) nasal spray 4 mg/0.1 mL, For excess sedation from opioids, Disp: 1 kit, Rfl: 2   nitroGLYCERIN (NITROSTAT) 0.3 MG SL tablet, DISSOLVE 1  TABLET UNDER THE TONGUE EVERY 5 MINUTES AS NEEDED FOR CHEST PAIN. MAXIMUM 3 DOSES., Disp: 100 tablet, Rfl: 3   Oxycodone HCl 10 MG TABS, Take 1 tablet (10 mg total) by mouth in the morning, at noon, in the evening, and at bedtime., Disp: 120 tablet, Rfl: 0   rosuvastatin (CRESTOR) 5 MG tablet, Take 5 mg by mouth daily with supper., Disp: , Rfl: 0   Semaglutide, 2 MG/DOSE, 8 MG/3ML SOPN, Inject 1 each into the skin once a week., Disp: , Rfl:   Current Facility-Administered Medications:    sodium chloride flush (NS) 0.9 % injection 3 mL, 3 mL, Intravenous, Q12H, Furth, Cadence H, PA-C  Allergies  Allergen Reactions   Azathioprine Other (See Comments)    Azathioprine hypersensitivity reaction - Symptoms mimicking sepsis - was hospitalized   Novolog [Insulin Aspart] Hives    I personally reviewed active problem list, medication list, allergies, family history, social history, health maintenance with the patient/caregiver today.   ROS  Constitutional: Negative for fever, positive for  weight change.  Respiratory: Negative for cough and shortness of breath.   Cardiovascular: Negative for chest pain or palpitations.  Gastrointestinal: Negative for abdominal pain, no bowel changes.  Musculoskeletal: Negative for gait problem or joint swelling.  Skin: Negative for rash.  Neurological: Negative for dizziness or headache.  No other specific complaints in a complete review of systems (except as listed in HPI above).   Objective  Vitals:   07/20/22 1334  BP: 122/68  Pulse: 91  Resp: 16  SpO2: 96%  Weight: 194 lb (88 kg)  Height: $Remove'5\' 7"'[id number]$  (1.702 m)    Body mass index is 30.38 kg/m.  Physical Exam  Constitutional:  Patient appears well-developed , temporal waisting . Obese  No distress.  HEENT: head atraumatic, normocephalic, pupils equal and reactive to light, neck supple, throat within normal limits Cardiovascular: Normal rate, regular rhythm and normal heart sounds.  No murmur heard. No BLE edema. Pulmonary/Chest: end inspiratory wheezing. No respiratory distress. Abdominal: Soft.  There is no tenderness. Muscular skeletal: using a cane, he needs to use his hands for support, favors right slow gait  Psychiatric: Patient has a normal mood and affect. behavior is normal. Judgment and thought content normal.   Recent Results (from the past 2160 hour(s))  TSH     Status: None   Collection Time: 06/05/22 10:11 AM  Result Value Ref Range   TSH 1.015 0.350 - 4.500 uIU/mL    Comment: Performed by a 3rd Generation assay with a functional sensitivity of <=0.01 uIU/mL. Performed at [facility], [address] [place], [place] [address]   Basic metabolic panel     Status: Abnormal   Collection Time: 06/05/22 10:11 AM  Result Value Ref Range   Sodium 140 135 - 145 mmol/L   Potassium 4.1 3.5 - 5.1 mmol/L   Chloride 106 98 - 111 mmol/L   CO2 27 22 - 32 mmol/L   Glucose, Bld 229 (H) 70 - 99 mg/dL    Comment: Glucose reference range applies only to samples taken after fasting for at least 8 hours.   BUN 26 (H) 8 - 23 mg/dL   Creatinine, Ser 1.57 (H) 0.61 - 1.24 mg/dL   Calcium 9.5 8.9 - 10.3 mg/dL   GFR, Estimated 50 (L) >60 mL/min    Comment: (NOTE) Calculated using the CKD-EPI Creatinine Equation (2021)    Anion gap 7 5 - 15    Comment: Performed at [facility],  [place], [place] [address]  CBC     Status: None   Collection Time: 06/05/22 10:11 AM  Result Value Ref Range   WBC 5.3 4.0 - 10.5 K/uL   RBC 4.69 4.22 - 5.81 MIL/uL   Hemoglobin 13.8 13.0 - 17.0 g/dL   HCT 42.5 39.0 - 52.0 %   MCV 90.6 80.0 - 100.0 fL   MCH 29.4 26.0 - 34.0 pg   MCHC 32.5 30.0 -  36.0 g/dL   RDW 13.4 11.5 - 15.5 %   Platelets 155 150 - 400 K/uL   nRBC 0.0 0.0 - 0.2 %    Comment: Performed at [facility], [address], [place], [place] [address]  Basic metabolic panel     Status: Abnormal   Collection Time: 06/24/22 12:00 AM  Result Value Ref Range   Glucose 182    BUN 22 (A) 4 - 21   CO2 33 (A) 13 - 22   Creatinine 1.2 0.6 - 1.3   Potassium 4.5 3.5 - 5.1 mEq/L   Sodium 141 137 - 147   Chloride 103 99 - 108  Comprehensive metabolic panel     Status: None   Collection Time: 06/24/22 12:00 AM  Result Value Ref Range   eGFR 61    Calcium 9.6 8.7 - 10.7   Albumin 3.9 3.5 - 5.0  Lipid panel     Status: None   Collection Time: 06/24/22 12:00 AM  Result Value Ref Range   LDl/HDL Ratio 2.7    Triglycerides 133 40 - 160   Cholesterol 106 0 - 200   HDL 39 35 - 70   LDL Cholesterol 40   Hepatic function panel     Status: None   Collection Time: 06/24/22 12:00 AM  Result Value Ref Range   Alkaline Phosphatase 66 25 - 125   ALT 14 10 - 40 U/L   AST 16 14 - 40   Bilirubin, Total 0.6   Hemoglobin A1c     Status: None   Collection Time: 06/24/22 12:00 AM  Result Value Ref Range   Hemoglobin A1C 7.7   TSH     Status: None   Collection Time: 06/24/22 12:00 AM  Result Value Ref Range   TSH 0.49 0.41 - 5.90  Novel Coronavirus, NAA ([facility])     Status: None   Collection Time: 07/02/22 12:00 AM   Specimen: Nasopharyngeal(NP) swabs in vial transport medium   Nasopharynge  Previous  Result Value Ref Range   SARS-CoV-2, NAA Not Detected Not Detected    Comment: This nucleic acid amplification test was developed and its performance characteristics determined by [employer]. Nucleic acid amplification tests include RT-PCR and TMA. This test has not been FDA cleared or approved. This test has been authorized by FDA under an Emergency Use Authorization (EUA). This test is only authorized for the duration of time the declaration that  circumstances exist justifying the authorization of the emergency use of in vitro diagnostic tests for detection of SARS-CoV-2 virus and/or diagnosis of COVID-19 infection under section 564(b)(1) of the Act, 21 U.S.C. 071QRF-7(J) (1), unless the authorization is terminated or revoked sooner. When diagnostic testing is negative, the possibility of a false negative result should be considered in the context of a patient's recent exposures and the presence of clinical signs and symptoms consistent with COVID-19. An individual without symptoms of COVID-19 and who is not shedding SARS-CoV-2 virus wo uld expect to have a negative (  not detected) result in this assay.     PHQ2/9:    07/20/2022    1:41 PM 07/02/2022    2:09 PM 06/12/2022   10:43 AM 06/04/2022   10:02 AM 04/17/2022    1:45 PM  Depression screen PHQ 2/9  Decreased Interest 0 1 0 1 0  Down, Depressed, Hopeless 1 1 1 1 2   PHQ - 2 Score 1 2 1 2 2   Altered sleeping 1 0 3 3 3   Tired, decreased energy 1 0 3 0 0  Change in appetite 0 3 0 3 2  Feeling bad or failure about yourself  0 0 0 0 0  Trouble concentrating 0 0 0 0 0  Moving slowly or fidgety/restless 0 0 0 0 0  Suicidal thoughts 0 0 0 0 0  PHQ-9 Score 3 5 7 8 7   Difficult doing work/chores  Not difficult at all   Somewhat difficult    phq 9 is positive   Fall Risk:    07/20/2022    1:32 PM 07/02/2022    2:09 PM 06/12/2022   10:37 AM 06/04/2022    9:51 AM 05/11/2022    8:11 AM  Fall Risk   Falls in the past year? 1 1 1 1  0  Number falls in past yr: 1 1 1 1    Injury with Fall? 1 1 0 0   Risk for fall due to : Impaired balance/gait Impaired balance/gait;Impaired mobility Impaired mobility;Impaired balance/gait Impaired balance/gait;Impaired mobility   Follow up Falls prevention discussed  Falls prevention discussed Falls prevention discussed       Functional Status Survey: Is the patient deaf or have difficulty hearing?: Yes Does the patient have difficulty seeing,  even when wearing glasses/contacts?: No Does the patient have difficulty concentrating, remembering, or making decisions?: No Does the patient have difficulty walking or climbing stairs?: Yes Does the patient have difficulty dressing or bathing?: No Does the patient have difficulty doing errands alone such as visiting a doctor's office or shopping?: Yes    Assessment & Plan  1. Atherosclerosis of aorta (HCC)  Continue statin therapy   2. Mild protein-calorie malnutrition ([place])  He will try adding more calories, needs to gain some weight   3. Type 2 diabetes mellitus with diabetic polyneuropathy, with long-term current use of insulin (HCC)  Keep follow up with Dr. Honor Junes   4. Inflammatory spondylopathy of lumbosacral region [facility])  Under the care of Dr. [name], his symptoms are getting worse   5. Asthma, moderate persistent, poorly-controlled  He could not afford Trelegy in the past , discussed applying for assistance program again   6. Hypothyroidism, acquired, autoimmune  Still on levothyroxine  Last TSH at goal

## 2022-07-18 ENCOUNTER — Other Ambulatory Visit: Payer: Self-pay | Admitting: Cardiovascular Disease

## 2022-07-20 ENCOUNTER — Encounter: Payer: Self-pay | Admitting: Family Medicine

## 2022-07-20 ENCOUNTER — Ambulatory Visit (INDEPENDENT_AMBULATORY_CARE_PROVIDER_SITE_OTHER): Payer: Medicare Other | Admitting: Family Medicine

## 2022-07-20 VITALS — BP 122/68 | HR 91 | Resp 16 | Ht 67.0 in | Wt 194.0 lb

## 2022-07-20 DIAGNOSIS — E441 Mild protein-calorie malnutrition: Secondary | ICD-10-CM | POA: Diagnosis not present

## 2022-07-20 DIAGNOSIS — E1142 Type 2 diabetes mellitus with diabetic polyneuropathy: Secondary | ICD-10-CM | POA: Diagnosis not present

## 2022-07-20 DIAGNOSIS — J4540 Moderate persistent asthma, uncomplicated: Secondary | ICD-10-CM | POA: Insufficient documentation

## 2022-07-20 DIAGNOSIS — I700 Atherosclerosis of aorta: Secondary | ICD-10-CM | POA: Diagnosis not present

## 2022-07-20 DIAGNOSIS — E063 Autoimmune thyroiditis: Secondary | ICD-10-CM

## 2022-07-20 DIAGNOSIS — M4697 Unspecified inflammatory spondylopathy, lumbosacral region: Secondary | ICD-10-CM

## 2022-07-20 DIAGNOSIS — Z794 Long term (current) use of insulin: Secondary | ICD-10-CM

## 2022-07-23 ENCOUNTER — Encounter: Payer: Self-pay | Admitting: Anesthesiology

## 2022-07-23 ENCOUNTER — Ambulatory Visit: Payer: Medicare Other | Attending: Anesthesiology | Admitting: Anesthesiology

## 2022-07-23 DIAGNOSIS — R29898 Other symptoms and signs involving the musculoskeletal system: Secondary | ICD-10-CM | POA: Diagnosis not present

## 2022-07-23 DIAGNOSIS — M5136 Other intervertebral disc degeneration, lumbar region: Secondary | ICD-10-CM | POA: Diagnosis not present

## 2022-07-23 DIAGNOSIS — F1190 Opioid use, unspecified, uncomplicated: Secondary | ICD-10-CM

## 2022-07-23 DIAGNOSIS — M4716 Other spondylosis with myelopathy, lumbar region: Secondary | ICD-10-CM | POA: Diagnosis not present

## 2022-07-23 DIAGNOSIS — M545 Low back pain, unspecified: Secondary | ICD-10-CM

## 2022-07-23 DIAGNOSIS — G7000 Myasthenia gravis without (acute) exacerbation: Secondary | ICD-10-CM

## 2022-07-23 DIAGNOSIS — M5432 Sciatica, left side: Secondary | ICD-10-CM | POA: Diagnosis not present

## 2022-07-23 DIAGNOSIS — G894 Chronic pain syndrome: Secondary | ICD-10-CM

## 2022-07-23 DIAGNOSIS — M47817 Spondylosis without myelopathy or radiculopathy, lumbosacral region: Secondary | ICD-10-CM

## 2022-07-23 DIAGNOSIS — M542 Cervicalgia: Secondary | ICD-10-CM

## 2022-07-23 DIAGNOSIS — A6923 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: Secondary | ICD-10-CM

## 2022-07-23 MED ORDER — OXYCODONE HCL 10 MG PO TABS: 10.0000 mg | ORAL_TABLET | Freq: Four times a day (QID) | ORAL | 0 refills | Status: AC

## 2022-07-23 MED ORDER — OXYCODONE HCL 10 MG PO TABS: 10.0000 mg | ORAL_TABLET | Freq: Four times a day (QID) | ORAL | 0 refills | Status: DC

## 2022-07-23 NOTE — Progress Notes (Signed)
Virtual Visit via Telephone Note  I connected with [name] on 07/23/22 at  3:30 PM EDT by telephone and verified that I am speaking with the correct person using two identifiers.  Location: Patient: Home Provider: [facility]   I discussed the limitations, risks, security and privacy concerns of performing an evaluation and management service by telephone and the availability of in person appointments. I also discussed with the patient that there may be a patient responsible charge related to this service. The patient expressed understanding and agreed to proceed.   History of Present Illness: I spoke with [name] via telephone as we are unable link to the video portion the conference.  He reports that he has done very well following his most recent epidural back in August.  He still doing his physical therapy exercises as best possible taking his medications as prescribed but is starting to have some recurrence of the same type sciatica symptoms but the left lower back and left leg.  Unfortunately nothing other than epidurals is really given him significant long-term improvement.  He generally goes for about 8 to 10 weeks before he starts having recurrence of similar symptoms as before mainly an aching gnawing pain in the low back with radiation in the left lateral lower leg.  The epidurals have been very effective for that.  No change in lower extremity strength or bowel or bladder function has been noted.  He does have some chronic lower extremity weakness he reports.  This has been stable.  He is taking his medications as prescribed and these continue to work well for him without side effect and continue to give him good relief.  Review of systems: General: No fevers or chills Pulmonary: No shortness of breath or dyspnea Cardiac: No angina or palpitations or lightheadedness GI: No abdominal pain or constipation Psych: No depression    Observations/Objective:  Current  Outpatient Medications:    [START ON 08/28/2022] Oxycodone HCl 10 MG TABS, Take 1 tablet (10 mg total) by mouth in the morning, at noon, in the evening, and at bedtime., Disp: 120 tablet, Rfl: 0   albuterol (PROVENTIL) (2.5 MG/3ML) 0.083% nebulizer solution, Take 3 mLs (2.5 mg total) by nebulization every 6 (six) hours as needed for wheezing or shortness of breath., Disp: 75 mL, Rfl: 2   albuterol (VENTOLIN HFA) 108 (90 Base) MCG/ACT inhaler, Inhale 2 puffs into the lungs daily., Disp: , Rfl:    ammonium lactate (AMLACTIN) 12 % lotion, Apply 1 application topically as needed for dry skin., Disp: 400 g, Rfl: 3   aspirin EC 81 MG tablet, Take 1 tablet (81 mg total) by mouth daily., Disp: 90 tablet, Rfl: 3   clopidogrel (PLAVIX) 75 MG tablet, Take 1 tablet (75 mg) by mouth daily with breakfast, Disp: 30 tablet, Rfl: 6   cyclobenzaprine (FLEXERIL) 10 MG tablet, TAKE 1 TABLET(10 MG) BY MOUTH TWICE DAILY, Disp: 60 tablet, Rfl: 5   dexlansoprazole (DEXILANT) 60 MG capsule, Take 1 capsule (60 mg total) by mouth daily., Disp: 30 capsule, Rfl: 4   DULoxetine (CYMBALTA) 30 MG capsule, Take 1 capsule (30 mg total) by mouth daily., Disp: 90 capsule, Rfl: 1   DULoxetine (CYMBALTA) 60 MG capsule, Take 1 capsule (60 mg total) by mouth daily., Disp: 90 capsule, Rfl: 1   ezetimibe (ZETIA) 10 MG tablet, TAKE 1 TABLET(10 MG) BY MOUTH DAILY, Disp: 90 tablet, Rfl: 0   fluticasone furoate-vilanterol (BREO ELLIPTA) 200-25 MCG/ACT AEPB, Inhale 1 puff into  the lungs daily., Disp: , Rfl:    furosemide (LASIX) 20 MG tablet, TAKE 1 TABLET BY MOUTH DAILY AS NEEDED FOR WEIGHT GAIN OF 2 POUNDS OVERNIGHT OR 5 POUNDS PER WEEK, Disp: 90 tablet, Rfl: 0   gabapentin (NEURONTIN) 300 MG capsule, Take 1 capsule (300 mg total) by mouth 3 (three) times daily., Disp: 90 capsule, Rfl: 1   gabapentin (NEURONTIN) 800 MG tablet, Take 800 mg by mouth at bedtime., Disp: , Rfl:    Humidifier/Vaporizer Supplies (VICKS VAPORIZER SCENT) PADS, Apply 1  application. topically daily as needed (cough)., Disp: , Rfl:    insulin lispro (HUMALOG) 100 UNIT/ML KiwkPen, Inject 6-10 Units into the skin 3 (three) times daily.  sliding scale 1 unit for every 10 units of carbs, Disp: , Rfl:    Insulin Pen Needle 32G X 4 MM MISC, Inject 1 each into the skin as needed., Disp: , Rfl:    JARDIANCE 10 MG TABS tablet, TAKE 1 TABLET(10 MG) BY MOUTH DAILY BEFORE BREAKFAST, Disp: 30 tablet, Rfl: 1   LANTUS SOLOSTAR 100 UNIT/ML Solostar Pen, Inject 20-23 Units into the skin daily at 10 pm. Sliding scale based on blood glucose, Disp: , Rfl: 0   levocetirizine (XYZAL) 5 MG tablet, TAKE 1 TABLET(5 MG) BY MOUTH EVERY EVENING, Disp: 90 tablet, Rfl: 1   levothyroxine (SYNTHROID) 50 MCG tablet, TAKE 1 TABLET BY MOUTH DAILY MONDAY THROUGH SATURDAY AND 2 TABLETS EVERY SUNDAY. TAKE BEFORE BREAKFAST, Disp: 102 tablet, Rfl: 0   loratadine (CLARITIN) 10 MG tablet, Take 10 mg by mouth in the morning., Disp: , Rfl:    metFORMIN (GLUCOPHAGE) 1000 MG tablet, Take 1,000 mg by mouth 2 (two) times daily with a meal., Disp: , Rfl:    montelukast (SINGULAIR) 10 MG tablet, Take 10 mg by mouth at bedtime., Disp: , Rfl:    Multiple Vitamins-Minerals (MENS MULTI VITAMIN & MINERAL PO), Take 1 tablet by mouth in the morning and at bedtime., Disp: , Rfl:    naloxone (NARCAN) nasal spray 4 mg/0.1 mL, For excess sedation from opioids, Disp: 1 kit, Rfl: 2   nitroGLYCERIN (NITROSTAT) 0.3 MG SL tablet, DISSOLVE 1 TABLET UNDER THE TONGUE EVERY 5 MINUTES AS NEEDED FOR CHEST PAIN. MAXIMUM 3 DOSES., Disp: 100 tablet, Rfl: 3   [START ON 07/29/2022] Oxycodone HCl 10 MG TABS, Take 1 tablet (10 mg total) by mouth in the morning, at noon, in the evening, and at bedtime., Disp: 120 tablet, Rfl: 0   rosuvastatin (CRESTOR) 5 MG tablet, Take 5 mg by mouth daily with supper., Disp: , Rfl: 0   Semaglutide, 1 MG/DOSE, 2 MG/1.5ML SOPN, Inject 1 mg into the skin daily at 12 noon., Disp: , Rfl:   Current  Facility-Administered Medications:    sodium chloride flush (NS) 0.9 % injection 3 mL, 3 mL, Intravenous, Q12H, Furth, Cadence H, PA-C   Past Medical History:  Diagnosis Date   Allergy    dust, seasonal (worse in the fall).   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 Followed by Pain Specialist in [place], back and neck   Asthma    BRONCHITIS   Cataract    First Dx in 2012   Chronic combined systolic and diastolic congestive heart failure ([place])    a. 03/2018 Echo: EF 30-35%; b. 07/2018 Echo: EF 35-40%; c. 09/2019 TEE: EF 40-45%; d. 12/2020 Echo: EF 35-40%, sev apical ant, apical lat, apical inf, apical HK. GrI DD, nl RV fxn.e.01/2022  Echo: EF 45-50%.  Coronary artery disease    a. Prior Ant MI->s/p mult stents->LAD/RCA ([place]); b. 2016 Cath: nonobs dzs;  c. 04/2018 Cath/PCI: LCX 42m (3.25x15 Fowler DES); d. 02/2022 MV: high risk; e. 02/2022 Cath: LM nl, LAD 20p ISR, patent mid-stent, LCX patent stent, OM1 nl, OM2 50p, patent stent, OM3 40p, patent stent, RCA 40p ISR, 37m ISR, 72m/d, patent distal stent, RPDA patent stent, RPAV small, 80 (sl progression)-->Med Rx.   Deaf, left    Diabetes mellitus without complication ([place])    TYPE 2   Diabetic peripheral neuropathy ([place])    feet and hands   FUO (fever of unknown origin) 08/03/2018   GERD (gastroesophageal reflux disease)    Headache    muscle tension   Hyperlipidemia    Hypertension    Hyperthyroidism    Insomnia    Ischemic cardiomyopathy    a. 03/2018 Echo: EF 30-35%; b. 07/2018 Echo: EF 35-40%, Gr1 DD; c. 09/2019 TEE: EF 40-45%; d. 12/2020 Echo: EF 35-40%. GrI DD; e. 01/2022 Echo: EF 45-50%, no rwma, sev apical HK, nl RV fxn, Ao root 57mm.   Knee pain, acute 05/06/2020   Left arm weakness 10/04/2019   Left leg weakness 12/01/2019   Lyme disease    Chronic   Myasthenia gravis ([place])    (03/18/21 - no current treatment - "better than it has ever been" per pt)   Myocardial infarction ([place]) 2010   Palpitations    a. 10/2020 [facility]: RSR, 88 avg. 3  brief SVT episodes (max 5 beats @ 128). Rare PACs/PVCs. Triggered events did not correlate w/ significant arrthymia - some w/ sinus tach.   Seasonal allergies    Sepsis ([place])    a.07/2018 - unknown source. TEE neg for veg 09/2019.   Sleep apnea    CPAP   Wears hearing aid in both ears      Assessment and Plan: 1. DDD (degenerative disc disease), lumbar   2. Lumbar spondylosis with myelopathy   3. Sciatica, left side   4. Left leg weakness   5. Chronic, continuous use of opioids   6. Chronic pain syndrome   7. Cervicalgia   8. Facet arthritis of lumbosacral region   9. Low back pain at multiple sites   10. 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 Myasthenia gravis ([place])   12. Left arm weakness   Based on our discussion today I think is appropriate to refill his medicines for the next 2 months.  This be dated for October 25 and November 24.  No other changes in his pharmacologic regimen will be initiated.  He is to continue his Flexeril as well.  Continue stretching exercises.  We will schedule him for return to clinic in 1 month with an epidural at that time.  He is done very well with these.  He is also doing well with his medication with no side effect and continues to derive good functional lifestyle improvement with the medicines where he has failed more conservative therapy.  Continue follow-up with his primary care physicians for baseline medical care with a urine screen at his next appointment as scheduled.  Follow Up Instructions:    I discussed the assessment and treatment plan with the patient. The patient was provided an opportunity to ask questions and all were answered. The patient agreed with the plan and demonstrated an understanding of the instructions.   The patient was advised to call back or seek an in-person evaluation if the symptoms worsen or  if the condition fails to improve as anticipated.  I provided 30 minutes of non-face-to-face time during this  encounter.   Molli Barrows, MD

## 2022-07-26 ENCOUNTER — Other Ambulatory Visit: Payer: Self-pay | Admitting: Nurse Practitioner

## 2022-07-28 ENCOUNTER — Encounter: Payer: Medicare Other | Admitting: Internal Medicine

## 2022-07-28 NOTE — Progress Notes (Signed)
This encounter was created in error - please disregard.

## 2022-07-28 NOTE — Progress Notes (Deleted)
Subjective:   [name] is a 61 y.o. male who presents for Medicare Annual/Subsequent preventive examination.  Review of Systems:  ***       Objective:    Vitals: There were no vitals taken for this visit.  There is no height or weight on file to calculate BMI.     12/24/2021    1:47 PM 11/10/2021   12:50 PM 08/13/2021   11:29 AM 07/24/2021   11:29 AM 04/14/2021    7:08 AM 01/07/2021   11:06 AM 09/11/2020    2:05 PM  Advanced Directives  Does Patient Have a Medical Advance Directive? No Yes Yes Yes Yes Yes Yes  Type of Personnel officer;Living will [place];Living will [place];Living will  Does patient want to make changes to medical advance directive?    No - Patient declined No - Patient declined    Copy of [employer] in Chart?     Yes - validated most recent copy scanned in chart (See row information)      Tobacco Social History   Tobacco Use  Smoking Status Never  Smokeless Tobacco Never  Tobacco Comments   smoking cessation materials not required     Counseling given: Not Answered Tobacco comments: smoking cessation materials not required   Clinical Intake:                 Nutrition Risk Assessment:  Has the patient had any N/V/D within the last 2 months?  {YES/NO:21197} Does the patient have any non-healing wounds?  {YES/NO:21197} Has the patient had any unintentional weight loss or weight gain?  {YES/NO:21197}  Diabetes:  Is the patient diabetic?  {YES/NO:21197} If diabetic, was a CBG obtained today?  {YES/NO:21197} Did the patient bring in their glucometer from home?  {YES/NO:21197} How often do you monitor your CBG's? ***.   Financial Strains and Diabetes Management:  Are you having any financial strains with the device, your supplies or your medication? {YES/NO:21197}.  Does the patient want to be seen by Chronic Care Management for management  of their diabetes?  {YES/NO:21197} Would the patient like to be referred to a Nutritionist or for Diabetic Management?  {YES/NO:21197}  Diabetic Exams:  Diabetic Eye Exam: Completed ***. Overdue for diabetic eye exam. Pt has been advised about the importance in completing this exam. A referral has been placed today. Message sent to referral coordinator for scheduling purposes. Advised pt to expect a call from our office re: appt.  Diabetic Foot Exam: Completed ***. Pt has been advised about the importance in completing this exam. Pt is scheduled for diabetic foot exam on ***.         Past Medical History:  Diagnosis Date   Allergy    dust, seasonal (worse in the fall).   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 Followed by Pain Specialist in [place], back and neck   Asthma    BRONCHITIS   Cataract    First Dx in 2012   Chronic combined systolic and diastolic congestive heart failure ([place])    a. 03/2018 Echo: EF 30-35%; b. 07/2018 Echo: EF 35-40%; c. 09/2019 TEE: EF 40-45%; d. 12/2020 Echo: EF 35-40%, sev apical ant, apical lat, apical inf, apical HK. GrI DD, nl RV fxn.e.01/2022  Echo: EF 45-50%.   Coronary artery disease    a. Prior Ant MI->s/p mult stents->LAD/RCA ([place]); b. 2016 Cath:  nonobs dzs;  c. 04/2018 Cath/PCI: LCX 38m(3.25x15 SPort [place]; d. 02/2022 MV: high risk; e. 02/2022 Cath: LM nl, LAD 20p ISR, patent mid-stent, LCX patent stent, OM1 nl, OM2 50p, patent stent, OM3 40p, patent stent, RCA 40p ISR, 261mSR, 4050m patent distal stent, RPDA patent stent, RPAV small, 80 (sl progression)-->Med Rx.   Deaf, left    Diabetes mellitus without complication ([place]  TYPE 2   Diabetic peripheral neuropathy ([place]  feet and hands   FUO (fever of unknown origin) 08/03/2018   GERD (gastroesophageal reflux disease)    Headache    muscle tension   Hyperlipidemia    Hypertension    Hyperthyroidism    Insomnia    Ischemic cardiomyopathy    a. 03/2018 Echo: EF 30-35%; b. 07/2018 Echo: EF  35-40%, Gr1 DD; c. 09/2019 TEE: EF 40-45%; d. 12/2020 Echo: EF 35-40%. GrI DD; e. 01/2022 Echo: EF 45-50%, no rwma, sev apical HK, nl RV fxn, Ao root 15m85m Knee pain, acute 05/06/2020   Left arm weakness 10/04/2019   Left leg weakness 12/01/2019   Lyme disease    Chronic   Myasthenia gravis (HCC)[place] (03/18/21 - no current treatment - "better than it has ever been" per pt)   Myocardial infarction (HCC)[place]   Palpitations    a. 10/2020 Zio: RSR, 88 avg. 3 brief SVT episodes (max 5 beats @ 128). Rare PACs/PVCs. Triggered events did not correlate w/ significant arrthymia - some w/ sinus tach.   Seasonal allergies    Sepsis (HCC)[place] a.07/2018 - unknown source. TEE neg for veg 09/2019.   Sleep apnea    CPAP   Wears hearing aid in both ears    Past Surgical History:  Procedure Laterality Date   BILATERAL CARPAL TUNNEL RELEASE Bilateral L in 2012 and R in 2013   [place]  [place].   COLONOSCOPY WITH PROPOFOL N/A 01/10/2016   Procedure: COLONOSCOPY WITH PROPOFOL;  Surgeon: DarrLucilla Lame;  Location: [facility] ENDOSCOPY;  Service: Endoscopy;  Laterality: N/A;   COLONOSCOPY WITH PROPOFOL N/A 04/14/2021   Procedure: COLONOSCOPY WITH PROPOFOL;  Surgeon: WohlLucilla Lame;  [place] [place];  Laterality: N/A;  Diabetic - insulin and oral meds   CORONARY ANGIOPLASTY     CORONARY STENT INTERVENTION N/A 04/25/2018   Procedure: CORONARY STENT INTERVENTION;  Surgeon: AridWellington Hampshire;  [facility];  Service: Cardiovascular;  Laterality: N/A;   ESOPHAGOGASTRODUODENOSCOPY (EGD) WITH PROPOFOL N/A 01/10/2016   Procedure: ESOPHAGOGASTRODUODENOSCOPY (EGD) WITH PROPOFOL;  Surgeon: DarrLucilla Lame;  Location: [facility] ENDOSCOPY;  Service: Endoscopy;  Laterality: N/A;   ESOPHAGOGASTRODUODENOSCOPY (EGD) WITH PROPOFOL N/A 04/14/2021   Procedure: ESOPHAGOGASTRODUODENOSCOPY (EGD) WITH BIOPSY;  Surgeon: WohlLucilla Lame;   [place] [facility];  Laterality: N/A;   EYE SURGERY Bilateral 2012   cataract/bilateral vitrectomies   GIVENS CAPSULE STUDY N/A 08/20/2021   Procedure: GIVENS CAPSULE STUDY;  Surgeon: WohlLucilla Lame;  [facility];  Service: Endoscopy;  Laterality: N/A;   LEFT HEART CATH AND CORONARY ANGIOGRAPHY Left 04/25/2018   Procedure: LEFT HEART CATH AND CORONARY ANGIOGRAPHY;  Surgeon: AridWellington Hampshire;  [facility];  Service: Cardiovascular;  Laterality: Left;   LEFT HEART CATH AND CORONARY ANGIOGRAPHY Left 02/02/2022   Procedure: LEFT HEART CATH AND CORONARY ANGIOGRAPHY;  Surgeon: AridFletcher Anon  Mertie Clause, MD;  Location: [facility];  Service: Cardiovascular;  Laterality: Left;   TEE WITHOUT CARDIOVERSION N/A 09/05/2018   Procedure: TRANSESOPHAGEAL ECHOCARDIOGRAM (TEE);  Surgeon: [name], MD;  Location: [facility] ORS;  Service: Cardiovascular;  Laterality: N/A;   TONSILLECTOMY AND ADENOIDECTOMY     As a child   TUNNELED VENOUS CATHETER PLACEMENT     removed   Family History  Problem Relation Age of Onset   Diabetes Mother    Heart disease Mother    Cancer Father        Prostate CA, Anal cancer    Dementia Father    Diabetes Brother    Healthy Brother    Healthy Brother    Social History   Socioeconomic History   Marital status: Married    Spouse name: Neoma Laming   Number of children: 0   Years of education: Not on file   Highest education level: Master's degree (e.g., MA, MS, MEng, MEd, MSW, MBA)  Occupational History   Occupation: disabled    Comment: multiple medical problems - first approved for uncontrolled DM, but now has heart disease and Myasthenia Gravis   Tobacco Use   Smoking status: Never   Smokeless tobacco: Never   Tobacco comments:    smoking cessation materials not required  Vaping Use   Vaping Use: Never used  Substance and Sexual Activity   Alcohol use: Not Currently    Alcohol/week: 0.0  standard drinks of alcohol   Drug use: No   Sexual activity: Not Currently    Partners: Female  Other Topics Concern   Not on file  Social History Narrative   Not on file   Social Determinants of Health   Financial Resource Strain: Low Risk  (10/31/2021)   Overall Financial Resource Strain (CARDIA)    Difficulty of Paying Living Expenses: Not very hard  Food Insecurity: No Food Insecurity (10/31/2021)   Hunger Vital Sign    Worried About Running Out of Food in the Last Year: Never true    Ran Out of Food in the Last Year: Never true  Transportation Needs: No Transportation Needs (07/24/2021)   PRAPARE - Hydrologist (Medical): No    Lack of Transportation (Non-Medical): No  Physical Activity: Insufficiently Active (10/31/2021)   Exercise Vital Sign    Days of Exercise per Week: 7 days    Minutes of Exercise per Session: 20 min  Stress: Stress Concern Present (10/31/2021)   [place]    Feeling of Stress : To some extent  Social Connections: Socially Integrated (10/31/2021)   Social Connection and Isolation Panel [NHANES]    Frequency of Communication with Friends and Family: Once a week    Frequency of Social Gatherings with Friends and Family: Three times a week    Attends Religious Services: More than 4 times per year    Active Member of Clubs or Organizations: Yes    Attends Archivist Meetings: More than 4 times per year    Marital Status: Married    Outpatient Encounter Medications as of 07/28/2022  Medication Sig   albuterol (PROVENTIL) (2.5 MG/3ML) 0.083% nebulizer solution Take 3 mLs (2.5 mg total) by nebulization every 6 (six) hours as needed for wheezing or shortness of breath.   albuterol (VENTOLIN HFA) 108 (90 Base) MCG/ACT inhaler Inhale 2 puffs into the lungs daily.   ammonium lactate (AMLACTIN) 12 % lotion Apply 1  application topically as needed for dry skin.    aspirin EC 81 MG tablet Take 1 tablet (81 mg total) by mouth daily.   clopidogrel (PLAVIX) 75 MG tablet Take 1 tablet (75 mg) by mouth daily with breakfast   cyclobenzaprine (FLEXERIL) 10 MG tablet TAKE 1 TABLET(10 MG) BY MOUTH TWICE DAILY   dexlansoprazole (DEXILANT) 60 MG capsule Take 1 capsule (60 mg total) by mouth daily.   DULoxetine (CYMBALTA) 30 MG capsule Take 1 capsule (30 mg total) by mouth daily.   DULoxetine (CYMBALTA) 60 MG capsule Take 1 capsule (60 mg total) by mouth daily.   ezetimibe (ZETIA) 10 MG tablet TAKE 1 TABLET(10 MG) BY MOUTH DAILY   fluticasone furoate-vilanterol (BREO ELLIPTA) 200-25 MCG/ACT AEPB Inhale 1 puff into the lungs daily.   furosemide (LASIX) 20 MG tablet TAKE 1 TABLET BY MOUTH DAILY AS NEEDED FOR WEIGHT GAIN OF 2 POUNDS OVERNIGHT OR 5 POUNDS PER WEEK   gabapentin (NEURONTIN) 300 MG capsule Take 1 capsule (300 mg total) by mouth 3 (three) times daily.   gabapentin (NEURONTIN) 800 MG tablet Take 800 mg by mouth at bedtime.   Humidifier/Vaporizer Supplies (VICKS VAPORIZER SCENT) PADS Apply 1 application. topically daily as needed (cough).   insulin lispro (HUMALOG) 100 UNIT/ML KiwkPen Inject 6-10 Units into the skin 3 (three) times daily.  sliding scale 1 unit for every 10 units of carbs   Insulin Pen Needle 32G X 4 MM MISC Inject 1 each into the skin as needed.   JARDIANCE 10 MG TABS tablet TAKE 1 TABLET(10 MG) BY MOUTH DAILY BEFORE BREAKFAST   LANTUS SOLOSTAR 100 UNIT/ML Solostar Pen Inject 20-23 Units into the skin daily at 10 pm. Sliding scale based on blood glucose   levocetirizine (XYZAL) 5 MG tablet TAKE 1 TABLET(5 MG) BY MOUTH EVERY EVENING   levothyroxine (SYNTHROID) 50 MCG tablet TAKE 1 TABLET BY MOUTH DAILY MONDAY THROUGH SATURDAY AND 2 TABLETS EVERY SUNDAY. TAKE BEFORE BREAKFAST   loratadine (CLARITIN) 10 MG tablet Take 10 mg by mouth in the morning.   metFORMIN (GLUCOPHAGE) 1000 MG tablet Take 1,000 mg by mouth 2 (two) times daily with a meal.    montelukast (SINGULAIR) 10 MG tablet Take 10 mg by mouth at bedtime.   Multiple Vitamins-Minerals (MENS MULTI VITAMIN & MINERAL PO) Take 1 tablet by mouth in the morning and at bedtime.   naloxone (NARCAN) nasal spray 4 mg/0.1 mL For excess sedation from opioids   nitroGLYCERIN (NITROSTAT) 0.3 MG SL tablet DISSOLVE 1 TABLET UNDER THE TONGUE EVERY 5 MINUTES AS NEEDED FOR CHEST PAIN. MAXIMUM 3 DOSES.   [START ON 07/29/2022] Oxycodone HCl 10 MG TABS Take 1 tablet (10 mg total) by mouth in the morning, at noon, in the evening, and at bedtime.   [START ON 08/28/2022] Oxycodone HCl 10 MG TABS Take 1 tablet (10 mg total) by mouth in the morning, at noon, in the evening, and at bedtime.   rosuvastatin (CRESTOR) 5 MG tablet Take 5 mg by mouth daily with supper.   Semaglutide, 1 MG/DOSE, 2 MG/1.5ML SOPN Inject 1 mg into the skin daily at 12 noon.   Facility-Administered Encounter Medications as of 07/28/2022  Medication   sodium chloride flush (NS) 0.9 % injection 3 mL    Activities of Daily Living    07/20/2022    1:33 PM 07/02/2022    2:10 PM  In your present state of health, do you have any difficulty performing the following activities:  Hearing? 1 1  Vision? 0 1  Difficulty concentrating or making decisions? 0 0  Walking or climbing stairs? 1 1  Dressing or bathing? 0 0  Doing errands, shopping? 1 0    Patient Care Team: [name], MD as PCP - General (Family Medicine) [name], MD as PCP - Cardiology (Cardiology) Erby Pian, MD as Consulting Physician (Pulmonary Disease) Lonia Farber, MD as Consulting Physician (Endocrinology) [name], MD as Consulting Physician (Gastroenterology) Germaine Pomfret, [facility] (Pharmacist) Molli Barrows, MD as Consulting Physician (Pain Medicine) Vern Claude, LCSW as Social Worker [name], MD as Consulting Physician (Cardiology)   Assessment:   This is a routine wellness examination for  Kyran.  Exercise Activities and Dietary recommendations     Goals Addressed   None     Fall Risk    07/20/2022    1:32 PM 07/02/2022    2:09 PM 06/12/2022   10:37 AM 06/04/2022    9:51 AM 05/11/2022    8:11 AM  Fall Risk   Falls in the past year? '1 1 1 1 '$ 0  Number falls in past yr: '1 1 1 1   '$ Injury with Fall? 1 1 0 0   Risk for fall due to : Impaired balance/gait Impaired balance/gait;Impaired mobility Impaired mobility;Impaired balance/gait Impaired balance/gait;Impaired mobility   Follow up Falls prevention discussed  Falls prevention discussed Falls prevention discussed    FALL RISK PREVENTION PERTAINING TO THE HOME:  Any stairs in or around the home? {YES/NO:21197} If so, are there any without handrails? {YES/NO:21197}  Home free of loose throw rugs in walkways, pet beds, electrical cords, etc? {YES/NO:21197} Adequate lighting in your home to reduce risk of falls? {YES/NO:21197}  ASSISTIVE DEVICES UTILIZED TO PREVENT FALLS:  Life alert? {YES/NO:21197} Use of a cane, walker or w/c? {YES/NO:21197} Grab bars in the bathroom? {YES/NO:21197} Shower chair or bench in shower? {YES/NO:21197} Elevated toilet seat or a handicapped toilet? {YES/NO:21197}   TIMED UP AND GO:  Was the test performed? {YES/NO:21197}.  Length of time to ambulate 10 feet: *** sec.   GAIT:  Appearance of gait: Gait steady and fast *** with/without the use of an assistive device. OR Gait slow and steady *** with/without the use of an assistive device.  Education: Fall risk prevention has been discussed.  Intervention(s) required? {YES/NO:21197}  DME/home health order needed?  {YES/NO:21197}   Depression Screen    07/20/2022    1:41 PM 07/02/2022    2:09 PM 06/12/2022   10:43 AM 06/04/2022   10:02 AM  PHQ 2/9 Scores  PHQ - 2 Score '1 2 1 2  '$ PHQ- 9 Score '3 5 7 8    '$ Cognitive Function    01/12/2022    3:05 PM  MMSE - Mini Mental State Exam  Orientation to time 5  Orientation to Place 5   Registration 3  Attention/ Calculation 5  Recall 3  Language- name 2 objects 2  Language- repeat 1  Language- follow 3 step command 3  Language- read & follow direction 1  Write a sentence 1  Copy design 1  Total score 30        07/23/2020   11:32 AM 02/25/2018    3:22 PM  6CIT Screen  What Year? 0 points 0 points  What month? 0 points 0 points  What time? 0 points 0 points  Count back from 20 0 points 0 points  Months in reverse 0 points 0 points  Repeat phrase 0 points  0 points  Total Score 0 points 0 points    Immunization History  Administered Date(s) Administered   Influenza Inj Mdck Quad Pf 06/14/2019   Influenza,inj,Quad PF,6+ Mos 06/16/2017, 06/10/2018, 07/23/2020, 06/04/2022   Influenza-Unspecified 07/18/2021   PFIZER(Purple Top)SARS-COV-2 Vaccination 12/22/2019, 01/16/2020, 08/05/2020   Pfizer Covid-19 Vaccine Bivalent Booster 4yr & up 07/18/2021   Pneumococcal Conjugate-13 07/21/2019   Pneumococcal Polysaccharide-23 01/25/2018   Td 04/04/2013    Qualifies for Shingles Vaccine? {YES/NO:21197} Zostavax completed ***. Due for Shingrix. Education has been provided regarding the importance of this vaccine. Pt has been advised to call insurance company to determine out of pocket expense. Advised may also receive vaccine at local pharmacy or Health Dept. Verbalized acceptance and understanding.  Tdap: Although this vaccine is not a covered service during a Wellness Exam, does the patient still wish to receive this vaccine today?  {YES/NO:21197}.  Education has been provided regarding the importance of this vaccine. Advised may receive this vaccine at local pharmacy or Health Dept. Aware to provide a copy of the vaccination record if obtained from local pharmacy or Health Dept. Verbalized acceptance and understanding.  Flu Vaccine: Due for Flu vaccine. Does the patient want to receive this vaccine today?  {YES/NO:21197}. Education has been provided regarding the  importance of this vaccine but still declined. Advised may receive this vaccine at local pharmacy or Health Dept. Aware to provide a copy of the vaccination record if obtained from local pharmacy or Health Dept. Verbalized acceptance and understanding.  Pneumococcal Vaccine: Due for Pneumococcal vaccine. Does the patient want to receive this vaccine today?  {YES/NO:21197}. Education has been provided regarding the importance of this vaccine but still declined. Advised may receive this vaccine at local pharmacy or Health Dept. Aware to provide a copy of the vaccination record if obtained from local pharmacy or Health Dept. Verbalized acceptance and understanding.   Covid-19 Vaccine: *** Information provided/ Completed vaccines  Screening Tests Health Maintenance  Topic Date Due   Zoster Vaccines- Shingrix (1 of 2) Never done   Diabetic kidney evaluation - Urine ACR  05/04/2020   OPHTHALMOLOGY EXAM  08/01/2020   Medicare Annual Wellness (AWV)  08/23/2022   FOOT EXAM  11/13/2022   HEMOGLOBIN A1C  12/23/2022   TETANUS/TDAP  04/05/2023   Diabetic kidney evaluation - GFR measurement  06/25/2023   COLONOSCOPY (Pts 45-434yrInsurance coverage will need to be confirmed)  04/14/2026   INFLUENZA VACCINE  Completed   COVID-19 Vaccine  Completed   Hepatitis C Screening  Completed   HIV Screening  Completed   HPV VACCINES  Aged Out   Cancer Screenings:  Colorectal Screening: Completed ***. Repeat every *** years; No longer required. Referral to GI placed ***. Pt aware the office will call re: appt.  Lung Cancer Screening: (Low Dose CT Chest recommended if Age 63-80ears, 30 pack-year currently smoking OR have quit w/in 15years.) {DOES NOT does:27190::"does not"} qualify.   Lung Cancer Screening Referral: An Epic message has been sent to ShBurgess EstelleRN (Oncology Nurse Navigator) regarding the possible need for this exam. ShRaquel Sarnaill review the patient's chart to determine if the patient truly  qualifies for the exam. If the patient qualifies, ShRaquel Sarnaill order the Low Dose CT of the chest to facilitate the scheduling of this exam.  Additional Screening:  Hepatitis C Screening: {DOES NOT does:27190::"does not"} qualify; Completed ***  Dental Screening: Recommended annual dental exams for proper oral hygiene  Community Resource Referral:  CRR required this visit?  {  YES/NO:21197}       Plan:  I have personally reviewed and addressed the Medicare Annual Wellness questionnaire and have noted the following in the patient's chart:  A. Medical and social history B. Use of alcohol, tobacco or illicit drugs  C. Current medications and supplements D. Functional ability and status E.  Nutritional status F.  Physical activity G. Advance directives H. List of other physicians I.  Hospitalizations, surgeries, and ER visits in previous 12 months J.  [place] such as hearing and vision if needed, cognitive and depression L. Referrals and appointments   In addition, I have reviewed and discussed with patient certain preventive protocols, quality metrics, and best practice recommendations. A written personalized care plan for preventive services as well as general preventive health recommendations were provided to patient.   Signed,   Teodora Medici, DO  07/28/2022 Nurse Health Advisor  Nurse Notes:

## 2022-08-11 ENCOUNTER — Ambulatory Visit: Payer: Medicare Other | Admitting: Anesthesiology

## 2022-08-17 ENCOUNTER — Encounter: Payer: Self-pay | Admitting: Anesthesiology

## 2022-08-17 ENCOUNTER — Ambulatory Visit
Admission: RE | Admit: 2022-08-17 | Discharge: 2022-08-17 | Disposition: A | Discharge status: Home / Self Care | Payer: Medicare Other | Source: Ambulatory Visit | Attending: Anesthesiology | Admitting: Anesthesiology

## 2022-08-17 ENCOUNTER — Ambulatory Visit (HOSPITAL_BASED_OUTPATIENT_CLINIC_OR_DEPARTMENT_OTHER): Payer: Medicare Other | Admitting: Anesthesiology

## 2022-08-17 ENCOUNTER — Other Ambulatory Visit: Payer: Self-pay | Admitting: Anesthesiology

## 2022-08-17 VITALS — BP 116/82 | HR 97 | Temp 96.6°F | Resp 13 | Ht 67.0 in | Wt 185.0 lb

## 2022-08-17 DIAGNOSIS — M4716 Other spondylosis with myelopathy, lumbar region: Secondary | ICD-10-CM | POA: Insufficient documentation

## 2022-08-17 DIAGNOSIS — R29898 Other symptoms and signs involving the musculoskeletal system: Secondary | ICD-10-CM | POA: Diagnosis present

## 2022-08-17 DIAGNOSIS — G894 Chronic pain syndrome: Secondary | ICD-10-CM

## 2022-08-17 DIAGNOSIS — M542 Cervicalgia: Secondary | ICD-10-CM

## 2022-08-17 DIAGNOSIS — M47817 Spondylosis without myelopathy or radiculopathy, lumbosacral region: Secondary | ICD-10-CM

## 2022-08-17 DIAGNOSIS — F1190 Opioid use, unspecified, uncomplicated: Secondary | ICD-10-CM | POA: Diagnosis present

## 2022-08-17 DIAGNOSIS — M5432 Sciatica, left side: Secondary | ICD-10-CM

## 2022-08-17 DIAGNOSIS — M5136 Other intervertebral disc degeneration, lumbar region: Secondary | ICD-10-CM | POA: Insufficient documentation

## 2022-08-17 MED ORDER — OXYCODONE HCL 10 MG PO TABS: 10.0000 mg | ORAL_TABLET | Freq: Four times a day (QID) | ORAL | 0 refills | Status: DC

## 2022-08-17 MED ORDER — SODIUM CHLORIDE 0.9% FLUSH
10.0000 mL | Freq: Once | INTRAVENOUS | Status: AC
  Administered 2022-08-17: 10 mL

## 2022-08-17 MED ORDER — TRIAMCINOLONE ACETONIDE 40 MG/ML IJ SUSP
40.0000 mg | Freq: Once | INTRAMUSCULAR | Status: AC
  Administered 2022-08-17: 40 mg

## 2022-08-17 MED ORDER — ROPIVACAINE HCL 2 MG/ML IJ SOLN
10.0000 mL | Freq: Once | INTRAMUSCULAR | Status: AC
  Administered 2022-08-17: 10 mL via EPIDURAL

## 2022-08-17 MED ORDER — IOHEXOL 180 MG/ML  SOLN
10.0000 mL | Freq: Once | INTRAMUSCULAR | Status: AC | PRN
  Administered 2022-08-17: 10 mL via EPIDURAL

## 2022-08-17 MED ORDER — LIDOCAINE HCL (PF) 1 % IJ SOLN
INTRAMUSCULAR | Status: AC
  Filled 2022-08-17: qty 10

## 2022-08-17 MED ORDER — LIDOCAINE HCL (PF) 1 % IJ SOLN
5.0000 mL | Freq: Once | INTRAMUSCULAR | Status: AC
  Administered 2022-08-17: 5 mL via SUBCUTANEOUS

## 2022-08-17 MED ORDER — TRIAMCINOLONE ACETONIDE 40 MG/ML IJ SUSP
INTRAMUSCULAR | Status: AC
  Filled 2022-08-17: qty 1

## 2022-08-17 MED ORDER — ROPIVACAINE HCL 2 MG/ML IJ SOLN
INTRAMUSCULAR | Status: AC
  Filled 2022-08-17: qty 20

## 2022-08-17 MED ORDER — IOHEXOL 180 MG/ML  SOLN
INTRAMUSCULAR | Status: AC
  Filled 2022-08-17: qty 20

## 2022-08-17 MED ORDER — SODIUM CHLORIDE (PF) 0.9 % IJ SOLN
INTRAMUSCULAR | Status: AC
  Filled 2022-08-17: qty 10

## 2022-08-17 NOTE — Progress Notes (Signed)
Nursing Pain Medication Assessment:  Safety precautions to be maintained throughout the outpatient stay will include: orient to surroundings, keep bed in low position, maintain call bell within reach at all times, provide assistance with transfer out of bed and ambulation.  Medication Inspection Compliance: Pill count conducted under aseptic conditions, in front of the patient. Neither the pills nor the bottle was removed from the patient's sight at any time. Once count was completed pills were immediately returned to the patient in their original bottle.  Medication: Oxycodone IR Pill/Patch Count:  48 of 120 pills remain Pill/Patch Appearance: Markings consistent with prescribed medication Bottle Appearance: Standard pharmacy container. Clearly labeled. Filled Date: 29 / 19 / 2023 Last Medication intake:  Today

## 2022-08-17 NOTE — Patient Instructions (Signed)

## 2022-08-20 ENCOUNTER — Other Ambulatory Visit: Payer: Self-pay | Admitting: Family Medicine

## 2022-08-20 DIAGNOSIS — E063 Autoimmune thyroiditis: Secondary | ICD-10-CM

## 2022-08-20 LAB — TOXASSURE SELECT 13 (MW), URINE

## 2022-08-23 NOTE — Progress Notes (Signed)
Subjective:  Patient ID: [name], male    DOB: April 19, 1961  Age: 61 y.o. MRN: [id number]  CC: Back Pain (Left, lower)   Procedure: L5-S1 epidural steroid and fluoroscopic guidance with no sedation  HPI [name] presents for reevaluation.  [name] continues to have intermittent moderate to severe low back pain which has been protracted.  He takes his medications for that.  He gets good relief with them and has had no side effects with his medicines.  He does have sciatica symptoms and these have recently worsened.  He has periodic epidural steroid injections to help with the sciatica symptoms and generally gets 100% relief from the sciatica lasting anywhere from 2 to 4 months before he has gradual recurrence.  His last epidurals were in February and August.  Unfortunately he has failed more conservative therapy and despite efforts at home physical therapy stretching strengthening continues to be in severe pain from the sciatica component.  He generally gets good relief from the back pain with the oxycodone.  Outpatient Medications Prior to Visit  Medication Sig Dispense Refill   albuterol (PROVENTIL) (2.5 MG/3ML) 0.083% nebulizer solution Take 3 mLs (2.5 mg total) by nebulization every 6 (six) hours as needed for wheezing or shortness of breath. 75 mL 2   albuterol (VENTOLIN HFA) 108 (90 Base) MCG/ACT inhaler Inhale 2 puffs into the lungs daily.     ammonium lactate (AMLACTIN) 12 % lotion Apply 1 application topically as needed for dry skin. 400 g 3   aspirin EC 81 MG tablet Take 1 tablet (81 mg total) by mouth daily. 90 tablet 3   clopidogrel (PLAVIX) 75 MG tablet Take 1 tablet (75 mg) by mouth daily with breakfast 30 tablet 6   cyclobenzaprine (FLEXERIL) 10 MG tablet TAKE 1 TABLET(10 MG) BY MOUTH TWICE DAILY 60 tablet 5   dexlansoprazole (DEXILANT) 60 MG capsule Take 1 capsule (60 mg total) by mouth daily. 30 capsule 4   DULoxetine (CYMBALTA) 30 MG capsule Take 1 capsule (30 mg  total) by mouth daily. 90 capsule 1   DULoxetine (CYMBALTA) 60 MG capsule Take 1 capsule (60 mg total) by mouth daily. 90 capsule 1   ezetimibe (ZETIA) 10 MG tablet TAKE 1 TABLET(10 MG) BY MOUTH DAILY 90 tablet 0   fluticasone furoate-vilanterol (BREO ELLIPTA) 200-25 MCG/ACT AEPB Inhale 1 puff into the lungs daily.     furosemide (LASIX) 20 MG tablet TAKE 1 TABLET BY MOUTH DAILY AS NEEDED FOR WEIGHT GAIN OF 2 POUNDS OVERNIGHT OR 5 POUNDS PER WEEK 90 tablet 0   gabapentin (NEURONTIN) 300 MG capsule Take 1 capsule (300 mg total) by mouth 3 (three) times daily. 90 capsule 1   gabapentin (NEURONTIN) 800 MG tablet Take 800 mg by mouth at bedtime.     Humidifier/Vaporizer Supplies (VICKS VAPORIZER SCENT) PADS Apply 1 application. topically daily as needed (cough).     insulin lispro (HUMALOG) 100 UNIT/ML KiwkPen Inject 6-10 Units into the skin 3 (three) times daily.  sliding scale 1 unit for every 10 units of carbs     Insulin Pen Needle 32G X 4 MM MISC Inject 1 each into the skin as needed.     JARDIANCE 10 MG TABS tablet TAKE 1 TABLET(10 MG) BY MOUTH DAILY BEFORE BREAKFAST 30 tablet 1   LANTUS SOLOSTAR 100 UNIT/ML Solostar Pen Inject 20-23 Units into the skin daily at 10 pm. Sliding scale based on blood glucose  0   levocetirizine (XYZAL) 5 MG tablet TAKE  1 TABLET(5 MG) BY MOUTH EVERY EVENING 90 tablet 1   loratadine (CLARITIN) 10 MG tablet Take 10 mg by mouth in the morning.     metFORMIN (GLUCOPHAGE) 1000 MG tablet Take 1,000 mg by mouth 2 (two) times daily with a meal.     montelukast (SINGULAIR) 10 MG tablet Take 10 mg by mouth at bedtime.     Multiple Vitamins-Minerals (MENS MULTI VITAMIN & MINERAL PO) Take 1 tablet by mouth in the morning and at bedtime.     naloxone (NARCAN) nasal spray 4 mg/0.1 mL For excess sedation from opioids 1 kit 2   nitroGLYCERIN (NITROSTAT) 0.3 MG SL tablet DISSOLVE 1 TABLET UNDER THE TONGUE EVERY 5 MINUTES AS NEEDED FOR CHEST PAIN. MAXIMUM 3 DOSES. 100 tablet 3    [START ON 08/28/2022] Oxycodone HCl 10 MG TABS Take 1 tablet (10 mg total) by mouth in the morning, at noon, in the evening, and at bedtime. 120 tablet 0   rosuvastatin (CRESTOR) 5 MG tablet Take 5 mg by mouth daily with supper.  0   Semaglutide, 1 MG/DOSE, 2 MG/1.5ML SOPN Inject 1 mg into the skin daily at 12 noon.     levothyroxine (SYNTHROID) 50 MCG tablet TAKE 1 TABLET BY MOUTH DAILY MONDAY THROUGH SATURDAY AND 2 TABLETS EVERY SUNDAY. TAKE BEFORE BREAKFAST 102 tablet 0   Oxycodone HCl 10 MG TABS Take 1 tablet (10 mg total) by mouth in the morning, at noon, in the evening, and at bedtime. 120 tablet 0   Facility-Administered Medications Prior to Visit  Medication Dose Route Frequency Provider Last Rate Last Admin   sodium chloride flush (NS) 0.9 % injection 3 mL  3 mL Intravenous Q12H Furth, Cadence H, PA-C        Review of Systems CNS: No confusion or sedation Cardiac: No angina or palpitations GI: No abdominal pain or constipation Constitutional: No nausea vomiting fevers or chills  Objective:  BP 116/82   Pulse 97   Temp (!) 96.6 F (35.9 C) (Temporal)   Resp 13   Ht _0  (1.702 m)   Wt 185 lb (83.9 kg)   SpO2 98%   BMI 28.98 kg/m    BP Readings from Last 3 Encounters:  08/17/22 116/82  07/20/22 122/68  07/02/22 124/76     Wt Readings from Last 3 Encounters:  08/17/22 185 lb (83.9 kg)  07/20/22 194 lb (88 kg)  07/02/22 196 lb 3.2 oz (89 kg)     Physical Exam Pt is alert and oriented PERRL EOMI HEART IS RRR no murmur or rub LCTA no wheezing or rales MUSCULOSKELETAL reveals some paraspinous muscle tenderness but no overt trigger points.  Muscle tone and bulk is at baseline.  Labs  Lab Results  Component Value Date   HGBA1C 7.7 06/24/2022   HGBA1C 7.8 03/24/2022   HGBA1C 7.6 (A) 10/21/2021   Lab Results  Component Value Date   MICROALBUR 1.4 05/05/2019   LDLCALC 40 06/24/2022   CREATININE 1.2 06/24/2022     -------------------------------------------------------------------------------------------------------------------- Lab Results  Component Value Date   WBC 5.3 06/05/2022   HGB 13.8 06/05/2022   HCT 42.5 06/05/2022   PLT 155 06/05/2022   GLUCOSE 229 (H) 06/05/2022   CHOL 106 06/24/2022   TRIG 133 06/24/2022   HDL 39 06/24/2022   LDLDIRECT 36 07/17/2021   LDLCALC 40 06/24/2022   ALT 14 06/24/2022   AST 16 06/24/2022   NA 141 06/24/2022   K 4.5 06/24/2022   CL 103 06/24/2022  CREATININE 1.2 06/24/2022   BUN 22 (A) 06/24/2022   CO2 33 (A) 06/24/2022   TSH 0.49 06/24/2022   PSA 0.56 09/23/2021   INR 1.26 07/07/2018   HGBA1C 7.7 06/24/2022   MICROALBUR 1.4 05/05/2019    --------------------------------------------------------------------------------------------------------------------- DG PAIN CLINIC C-ARM 1-60 MIN NO REPORT  Result Date: 08/17/2022 Fluoro was used, but no Radiologist interpretation will be provided. Please refer to "NOTES" tab for provider progress note.    Assessment & Plan:   Chevez was seen today for back pain.  Diagnoses and all orders for this visit:  Lumbar spondylosis with myelopathy  DDD (degenerative disc disease), lumbar -     Lumbar Epidural Injection -     ToxASSURE Select 13 (MW), Urine  Sciatica, left side -     Lumbar Epidural Injection -     ToxASSURE Select 13 (MW), Urine  Left leg weakness -     Lumbar Epidural Injection -     ToxASSURE Select 13 (MW), Urine  Chronic pain syndrome  Cervicalgia  Chronic, continuous use of opioids  Facet arthritis of lumbosacral region  Other orders -     triamcinolone acetonide (KENALOG-40) injection 40 mg -     sodium chloride flush (NS) 0.9 % injection 10 mL -     ropivacaine (PF) 2 mg/mL (0.2%) (NAROPIN) injection 10 mL -     lidocaine (PF) (XYLOCAINE) 1 % injection 5 mL -     iohexol (OMNIPAQUE) 180 MG/ML injection 10 mL -     Oxycodone HCl 10 MG TABS; Take 1 tablet (10  mg total) by mouth in the morning, at noon, in the evening, and at bedtime.        ----------------------------------------------------------------------------------------------------------------------  Problem List Items Addressed This Visit   None Visit Diagnoses     Lumbar spondylosis with myelopathy    -  Primary   Relevant Medications   triamcinolone acetonide (KENALOG-40) injection 40 mg (Completed)   Oxycodone HCl 10 MG TABS (Start on 09/27/2022)   DDD (degenerative disc disease), lumbar       Relevant Medications   triamcinolone acetonide (KENALOG-40) injection 40 mg (Completed)   Oxycodone HCl 10 MG TABS (Start on 09/27/2022)   Other Relevant Orders   ToxASSURE Select 13 (MW), Urine (Completed)   Sciatica, left side       Relevant Orders   ToxASSURE Select 13 (MW), Urine (Completed)   Left leg weakness       Relevant Orders   ToxASSURE Select 13 (MW), Urine (Completed)   Chronic pain syndrome       Relevant Medications   triamcinolone acetonide (KENALOG-40) injection 40 mg (Completed)   ropivacaine (PF) 2 mg/mL (0.2%) (NAROPIN) injection 10 mL (Completed)   lidocaine (PF) (XYLOCAINE) 1 % injection 5 mL (Completed)   Oxycodone HCl 10 MG TABS (Start on 09/27/2022)   Cervicalgia       Chronic, continuous use of opioids       Facet arthritis of lumbosacral region       Relevant Medications   triamcinolone acetonide (KENALOG-40) injection 40 mg (Completed)   Oxycodone HCl 10 MG TABS (Start on 09/27/2022)         ----------------------------------------------------------------------------------------------------------------------  1. DDD (degenerative disc disease), lumbar Based on his previous response to therapy and current symptom status I think it is reasonable to proceed with a therapeutic lumbar epidural steroid injection today.  He generally does quite well with these and they are the only modality that seems to keep his  sciatica pain under good control.[name]   He understands the risks and benefits of the procedure and these are once again reviewed in full detail.  He has been off his Plavix medication for 5.5 days.  I did review the [place] guidelines with him.  I think it is safe to proceed with epidural placement today.  He is to restart his Plavix tomorrow. - Lumbar Epidural Injection - ToxASSURE Select 13 (MW), Urine  2. Sciatica, left side As above - Lumbar Epidural Injection - ToxASSURE Select 13 (MW), Urine  3. Left leg weakness  - Lumbar Epidural Injection - ToxASSURE Select 13 (MW), Urine  4. Lumbar spondylosis with myelopathy   5. Chronic pain syndrome I have reviewed the [facility] practitioner database information is appropriate for refill.  His current medications are dated for 1124 and 1224.  6. Cervicalgia   7. Chronic, continuous use of opioids   8. Facet arthritis of lumbosacral region     ----------------------------------------------------------------------------------------------------------------------  I am having [name] "[name]" maintain his insulin lispro, loratadine, gabapentin, metFORMIN, Lantus SoloStar, naloxone, rosuvastatin, Insulin Pen Needle, aspirin EC, gabapentin, Multiple Vitamins-Minerals (MENS MULTI VITAMIN & MINERAL PO), montelukast, albuterol, ammonium lactate, DULoxetine, DULoxetine, albuterol, Vicks Vaporizer Scent, levocetirizine, cyclobenzaprine, fluticasone furoate-vilanterol, clopidogrel, Jardiance, dexlansoprazole, furosemide, ezetimibe, Semaglutide (1 MG/DOSE), Oxycodone HCl, nitroGLYCERIN, and Oxycodone HCl. We administered triamcinolone acetonide, sodium chloride flush, ropivacaine (PF) 2 mg/mL (0.2%), lidocaine (PF), and iohexol. We will continue to administer sodium chloride flush.   Meds ordered this encounter  Medications   triamcinolone acetonide (KENALOG-40) injection 40 mg   sodium chloride flush (NS) 0.9 % injection 10 mL   ropivacaine (PF) 2 mg/mL (0.2%) (NAROPIN)  injection 10 mL   lidocaine (PF) (XYLOCAINE) 1 % injection 5 mL   iohexol (OMNIPAQUE) 180 MG/ML injection 10 mL   Oxycodone HCl 10 MG TABS    Sig: Take 1 tablet (10 mg total) by mouth in the morning, at noon, in the evening, and at bedtime.    Dispense:  120 tablet    Refill:  0   Patient's Medications  New Prescriptions   No medications on file  Previous Medications   ALBUTEROL (PROVENTIL) (2.5 MG/3ML) 0.083% NEBULIZER SOLUTION    Take 3 mLs (2.5 mg total) by nebulization every 6 (six) hours as needed for wheezing or shortness of breath.   ALBUTEROL (VENTOLIN HFA) 108 (90 BASE) MCG/ACT INHALER    Inhale 2 puffs into the lungs daily.   AMMONIUM LACTATE (AMLACTIN) 12 % LOTION    Apply 1 application topically as needed for dry skin.   ASPIRIN EC 81 MG TABLET    Take 1 tablet (81 mg total) by mouth daily.   CLOPIDOGREL (PLAVIX) 75 MG TABLET    Take 1 tablet (75 mg) by mouth daily with breakfast   CYCLOBENZAPRINE (FLEXERIL) 10 MG TABLET    TAKE 1 TABLET(10 MG) BY MOUTH TWICE DAILY   DEXLANSOPRAZOLE (DEXILANT) 60 MG CAPSULE    Take 1 capsule (60 mg total) by mouth daily.   DULOXETINE (CYMBALTA) 30 MG CAPSULE    Take 1 capsule (30 mg total) by mouth daily.   DULOXETINE (CYMBALTA) 60 MG CAPSULE    Take 1 capsule (60 mg total) by mouth daily.   EZETIMIBE (ZETIA) 10 MG TABLET    TAKE 1 TABLET(10 MG) BY MOUTH DAILY   FLUTICASONE FUROATE-VILANTEROL (BREO ELLIPTA) 200-25 MCG/ACT AEPB    Inhale 1 puff into the lungs daily.   FUROSEMIDE (LASIX) 20 MG TABLET  TAKE 1 TABLET BY MOUTH DAILY AS NEEDED FOR WEIGHT GAIN OF 2 POUNDS OVERNIGHT OR 5 POUNDS PER WEEK   GABAPENTIN (NEURONTIN) 300 MG CAPSULE    Take 1 capsule (300 mg total) by mouth 3 (three) times daily.   GABAPENTIN (NEURONTIN) 800 MG TABLET    Take 800 mg by mouth at bedtime.   HUMIDIFIER/VAPORIZER SUPPLIES (VICKS VAPORIZER SCENT) PADS    Apply 1 application. topically daily as needed (cough).   INSULIN LISPRO (HUMALOG) 100 UNIT/ML KIWKPEN     Inject 6-10 Units into the skin 3 (three) times daily.  sliding scale 1 unit for every 10 units of carbs   INSULIN PEN NEEDLE 32G X 4 MM MISC    Inject 1 each into the skin as needed.   JARDIANCE 10 MG TABS TABLET    TAKE 1 TABLET(10 MG) BY MOUTH DAILY BEFORE BREAKFAST   LANTUS SOLOSTAR 100 UNIT/ML SOLOSTAR PEN    Inject 20-23 Units into the skin daily at 10 pm. Sliding scale based on blood glucose   LEVOCETIRIZINE (XYZAL) 5 MG TABLET    TAKE 1 TABLET(5 MG) BY MOUTH EVERY EVENING   LORATADINE (CLARITIN) 10 MG TABLET    Take 10 mg by mouth in the morning.   METFORMIN (GLUCOPHAGE) 1000 MG TABLET    Take 1,000 mg by mouth 2 (two) times daily with a meal.   MONTELUKAST (SINGULAIR) 10 MG TABLET    Take 10 mg by mouth at bedtime.   MULTIPLE VITAMINS-MINERALS (MENS MULTI VITAMIN & MINERAL PO)    Take 1 tablet by mouth in the morning and at bedtime.   NALOXONE (NARCAN) NASAL SPRAY 4 MG/0.1 ML    For excess sedation from opioids   NITROGLYCERIN (NITROSTAT) 0.3 MG SL TABLET    DISSOLVE 1 TABLET UNDER THE TONGUE EVERY 5 MINUTES AS NEEDED FOR CHEST PAIN. MAXIMUM 3 DOSES.   OXYCODONE HCL 10 MG TABS    Take 1 tablet (10 mg total) by mouth in the morning, at noon, in the evening, and at bedtime.   ROSUVASTATIN (CRESTOR) 5 MG TABLET    Take 5 mg by mouth daily with supper.   SEMAGLUTIDE, 1 MG/DOSE, 2 MG/1.5ML SOPN    Inject 1 mg into the skin daily at 12 noon.  Modified Medications   Modified Medication Previous Medication   LEVOTHYROXINE (SYNTHROID) 50 MCG TABLET levothyroxine (SYNTHROID) 50 MCG tablet      TAKE 1 TABLET BY MOUTH EVERY DAY MONDAY THROUGH SATURDAY AND 2 TABLETS EVERY SUNDAY    TAKE 1 TABLET BY MOUTH DAILY MONDAY THROUGH SATURDAY AND 2 TABLETS EVERY SUNDAY. TAKE BEFORE BREAKFAST   OXYCODONE HCL 10 MG TABS Oxycodone HCl 10 MG TABS      Take 1 tablet (10 mg total) by mouth in the morning, at noon, in the evening, and at bedtime.    Take 1 tablet (10 mg total) by mouth in the morning, at noon, in the  evening, and at bedtime.  Discontinued Medications   No medications on file   ----------------------------------------------------------------------------------------------------------------------  Follow-up: Return in about 2 months (around 10/17/2022) for evaluation, med refill.   Procedure: L5-S1 LESI with fluoroscopic guidance and no moderate sedation  NOTE: The risks, benefits, and expectations of the procedure have been discussed and explained to the patient who was understanding and in agreement with suggested treatment plan. No guarantees were made.  DESCRIPTION OF PROCEDURE: Lumbar epidural steroid injection with no IV Versed, EKG, blood pressure, pulse, and pulse oximetry monitoring. The  procedure was performed with the patient in the prone position under fluoroscopic guidance.  Sterile prep x3 was initiated and I then injected subcutaneous lidocaine to the overlying L 5 S1 site after its fluoroscopic identifictation.  Using strict aseptic technique, I then advanced an 18-gauge Tuohy epidural needle in the midline using interlaminar approach via loss-of-resistance to saline technique. There was negative aspiration for heme or  CSF.  I then confirmed position with both AP and Lateral fluoroscan.  2 cc of contrast dye were injected and a  total of 5 mL of Preservative-Free normal saline mixed with 40 mg of Kenalog and 1cc Ropicaine 0.2 percent were injected incrementally via the  epidurally placed needle. The needle was removed. The patient tolerated the injection well and was convalesced and discharged to home in stable condition. Should the patient have any post procedure difficulty they have been instructed on how to contact us for assistance.   Molli Barrows, MD

## 2022-08-25 ENCOUNTER — Ambulatory Visit: Payer: Medicare Other | Admitting: Cardiovascular Disease

## 2022-09-03 ENCOUNTER — Ambulatory Visit: Payer: Medicare Other | Attending: Medical | Admitting: Medical

## 2022-09-03 ENCOUNTER — Encounter: Payer: Self-pay | Admitting: Medical

## 2022-09-03 VITALS — BP 122/70 | HR 101 | Ht 67.0 in | Wt 190.5 lb

## 2022-09-03 DIAGNOSIS — I2510 Atherosclerotic heart disease of native coronary artery without angina pectoris: Secondary | ICD-10-CM

## 2022-09-03 DIAGNOSIS — I255 Ischemic cardiomyopathy: Secondary | ICD-10-CM

## 2022-09-03 DIAGNOSIS — E785 Hyperlipidemia, unspecified: Secondary | ICD-10-CM

## 2022-09-03 DIAGNOSIS — E782 Mixed hyperlipidemia: Secondary | ICD-10-CM

## 2022-09-03 DIAGNOSIS — I5020 Unspecified systolic (congestive) heart failure: Secondary | ICD-10-CM

## 2022-09-03 DIAGNOSIS — G4733 Obstructive sleep apnea (adult) (pediatric): Secondary | ICD-10-CM

## 2022-09-03 DIAGNOSIS — I10 Essential (primary) hypertension: Secondary | ICD-10-CM

## 2022-09-03 DIAGNOSIS — I951 Orthostatic hypotension: Secondary | ICD-10-CM

## 2022-09-03 NOTE — Patient Instructions (Signed)
Medication Instructions:  - Your physician recommends that you continue on your current medications as directed. Please refer to the Current Medication list given to you today.  *If you need a refill on your cardiac medications before your next appointment, please call your pharmacy*   Lab Work: - none ordered  If you have labs (blood work) drawn today and your tests are completely normal, you will receive your results only by: [place] (if you have MyChart) OR A paper copy in the mail If you have any lab test that is abnormal or we need to change your treatment, we will call you to review the results.   Testing/Procedures: - none ordered   Follow-Up: At [facility], you and your health needs are our priority.  As part of our continuing mission to provide you with exceptional heart care, we have created designated Provider Care Teams.  These Care Teams include your primary Cardiologist (physician) and Advanced Practice Providers (APPs -  Physician Assistants and Nurse Practitioners) who all work together to provide you with the care you need, when you need it.  We recommend signing up for the patient portal called "MyChart".  Sign up information is provided on this After Visit Summary.  MyChart is used to connect with patients for Virtual Visits (Telemedicine).  Patients are able to view lab/test results, encounter notes, upcoming appointments, etc.  Non-urgent messages can be sent to your provider as well.   To learn more about what you can do with MyChart, go to [email or web address].    Your next appointment:   3 month(s)  The format for your next appointment:   In Person  Provider:   [name], MD    Other Instructions N/a  Important Information About Sugar

## 2022-09-03 NOTE — Progress Notes (Signed)
Cardiology Office Note:    Date:  09/04/2022   ID:  [name], DOB 1960-11-20, MRN [id number]  PCP:  [name], MD  [facility] Cardiologist:  [name], MD  [facility] Electrophysiologist:  None   Referring MD: [name], MD   Chief Complaint: 3 month follow-up  History of Present Illness:    [name] is a 61 y.o. male with a hx of CAD s/p multiple PCIs, ischemic cardiomyopathy, HFrEF, HTN, HLD, DM2, diabetic neuropathy, myasthenia gravis, sepsis, OSA who presents for 3 month follow-up.  The patient initially suffered a myocardial infarction in 2009 while living in [place].  His had approximately 10 stents between the LAD and RCA were time.  In June 2019 he had worsening dyspnea on exertion and chest pain.  Echo showed LVEF of 30 to 35%.  Cath showed a new 90% stenosis in the mid left circumflex with multiple patent LAD, RCA, and OM2 stents.  The circumflex was successfully treated with a drug-eluting stent.  He was admitted twice in the fall 2019 with sepsis.  TEE in December 2019 showed an EF of 40 to 45% without vegetation.  In October 2021, he had a syncopal episode in the setting of coughing.  He was subsequently found to be orthostatic and was encouraged to increase p.o. fluids.  Heart monitor in January 2022, did not show any significant arrhythmias.  Triggered events were associated with sinus tachycardia.  Echocardiogram in March 2022 showed LV dysfunction with an EF of 35 to 40% with severe apical anterior, apical lateral, apical inferior, and apical hypokinesis.  He also had grade 1 diastolic dysfunction and normal RV function.  Guideline directed medical therapy has been limited by dizziness and orthostasis.  He did not tolerate Entresto previously.  Patient underwent stress test for recurrent chest pain in April 2023.  This was high risk with partially reversible defects in the apical to mid inferior, inferolateral, and apical mid to anterior  locations.  He underwent diagnostic catheterization on Feb 02, 2022, showing predominantly moderate, nonobstructive disease with minimal in-stent restenosis.  He did have progression of small vessel-RPA V disease (80%), medical therapy was recommended.  In the setting of ongoing orthostatic lightheadedness, his metoprolol was reduced to 12.5 mg daily and subsequently discontinued altogether.  Patient was last seen in the clinic August 2023 and was overall doing well from a cardiac standpoint.  Today, the patient reports she has dizziness when he stands up. Orthostatics positive. He is off the metoprolol. He reports chest pain. It happens intermittently. He had an argument with his daughter and son in law, and he took SL NTG x 3. Stress and physical exertion can precipitate the pain. He does have DOE on severe exertion. Denies orthopnea or pnd. No lower leg edema. He takes lasix as needed. May take them every other day.   Past Medical History:  Diagnosis Date   Allergy    dust, seasonal (worse in the fall).   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 Followed by Pain Specialist in [place], back and neck   Asthma    BRONCHITIS   Cataract    First Dx in 2012   Chronic combined systolic and diastolic congestive heart failure ([place])    a. 03/2018 Echo: EF 30-35%; b. 07/2018 Echo: EF 35-40%; c. 09/2019 TEE: EF 40-45%; d. 12/2020 Echo: EF 35-40%, sev apical ant, apical lat, apical inf, apical HK. GrI DD, nl RV fxn.e.01/2022  Echo: EF  45-50%.   Coronary artery disease    a. Prior Ant MI->s/p mult stents->LAD/RCA ([place]); b. 2016 Cath: nonobs dzs;  c. 04/2018 Cath/PCI: LCX 25m(3.25x15 [place]); d. 02/2022 MV: high risk; e. 02/2022 Cath: LM nl, LAD 20p ISR, patent mid-stent, LCX patent stent, OM1 nl, OM2 50p, patent stent, OM3 40p, patent stent, RCA 40p ISR, 262mSR, 4067m patent distal stent, RPDA patent stent, RPAV small, 80 (sl progression)-->Med Rx.   Deaf, left    Diabetes mellitus without complication ([place]   TYPE 2   Diabetic peripheral neuropathy ([place]  feet and hands   FUO (fever of unknown origin) 08/03/2018   GERD (gastroesophageal reflux disease)    Headache    muscle tension   Hyperlipidemia    Hypertension    Hyperthyroidism    Insomnia    Ischemic cardiomyopathy    a. 03/2018 Echo: EF 30-35%; b. 07/2018 Echo: EF 35-40%, Gr1 DD; c. 09/2019 TEE: EF 40-45%; d. 12/2020 Echo: EF 35-40%. GrI DD; e. 01/2022 Echo: EF 45-50%, no rwma, sev apical HK, nl RV fxn, Ao root 45m74m Knee pain, acute 05/06/2020   Left arm weakness 10/04/2019   Left leg weakness 12/01/2019   Lyme disease    Chronic   Myasthenia gravis (HCC)[place] (03/18/21 - no current treatment - "better than it has ever been" per pt)   Myocardial infarction (HCC)[place]10   Palpitations    a. 10/2020 Zio: RSR, 88 avg. 3 brief SVT episodes (max 5 beats @ 128). Rare PACs/PVCs. Triggered events did not correlate w/ significant arrthymia - some w/ sinus tach.   Seasonal allergies    Sepsis (HCC)[place] a.07/2018 - unknown source. TEE neg for veg 09/2019.   Sleep apnea    CPAP   Wears hearing aid in both ears     Past Surgical History:  Procedure Laterality Date   BILATERAL CARPAL TUNNEL RELEASE Bilateral L in 2012 and R in 2013   CARDOceansidest recent in  March 2016.   COLONOSCOPY WITH PROPOFOL N/A 01/10/2016   Procedure: COLONOSCOPY WITH PROPOFOL;  Surgeon: DarrLucilla Lame;  Location: [facility] ENDOSCOPY;  Service: Endoscopy;  Laterality: N/A;   COLONOSCOPY WITH PROPOFOL N/A 04/14/2021   Procedure: COLONOSCOPY WITH PROPOFOL;  Surgeon: WohlLucilla Lame;  [place];  Laterality: N/A;  Diabetic - insulin and oral meds   CORONARY ANGIOPLASTY     CORONARY STENT INTERVENTION N/A 04/25/2018   Procedure: CORONARY STENT INTERVENTION;  Surgeon: AridWellington Hampshire;  [facility];  Service: Cardiovascular;  Laterality: N/A;   ESOPHAGOGASTRODUODENOSCOPY (EGD)  WITH PROPOFOL N/A 01/10/2016   Procedure: ESOPHAGOGASTRODUODENOSCOPY (EGD) WITH PROPOFOL;  Surgeon: DarrLucilla Lame;  Location: [facility] ENDOSCOPY;  Service: Endoscopy;  Laterality: N/A;   ESOPHAGOGASTRODUODENOSCOPY (EGD) WITH PROPOFOL N/A 04/14/2021   Procedure: ESOPHAGOGASTRODUODENOSCOPY (EGD) WITH BIOPSY;  Surgeon: WohlLucilla Lame;  [facility];  Laterality: N/A;   EYE SURGERY Bilateral 2012   cataract/bilateral vitrectomies   GIVENS CAPSULE STUDY N/A 08/20/2021   Procedure: GIVENS CAPSULE STUDY;  Surgeon: WohlLucilla Lame;  [facility];  Service: Endoscopy;  Laterality: N/A;   LEFT HEART CATH AND CORONARY ANGIOGRAPHY Left 04/25/2018   Procedure: LEFT HEART CATH AND CORONARY ANGIOGRAPHY;  Surgeon: AridWellington Hampshire;  [facility];  Service: Cardiovascular;  Laterality: Left;  LEFT HEART CATH AND CORONARY ANGIOGRAPHY Left 02/02/2022   Procedure: LEFT HEART CATH AND CORONARY ANGIOGRAPHY;  Surgeon: [name], MD;  Location: [place] [facility];  Service: Cardiovascular;  Laterality: Left;   TEE WITHOUT CARDIOVERSION N/A 09/05/2018   Procedure: TRANSESOPHAGEAL ECHOCARDIOGRAM (TEE);  Surgeon: [name], MD;  Location: [facility] ORS;  Service: Cardiovascular;  Laterality: N/A;   TONSILLECTOMY AND ADENOIDECTOMY     As a child   TUNNELED VENOUS CATHETER PLACEMENT     removed    Current Medications: Current Meds  Medication Sig   albuterol (PROVENTIL) (2.5 MG/3ML) 0.083% nebulizer solution Take 3 mLs (2.5 mg total) by nebulization every 6 (six) hours as needed for wheezing or shortness of breath.   albuterol (VENTOLIN HFA) 108 (90 Base) MCG/ACT inhaler Inhale 2 puffs into the lungs daily.   ammonium lactate (AMLACTIN) 12 % lotion Apply 1 application topically as needed for dry skin.   aspirin EC 81 MG tablet Take 1 tablet (81 mg total) by mouth daily.   clopidogrel (PLAVIX) 75 MG tablet Take 1 tablet (75 mg) by mouth  daily with breakfast   cyclobenzaprine (FLEXERIL) 10 MG tablet TAKE 1 TABLET(10 MG) BY MOUTH TWICE DAILY   dexlansoprazole (DEXILANT) 60 MG capsule Take 1 capsule (60 mg total) by mouth daily.   DULoxetine (CYMBALTA) 30 MG capsule Take 1 capsule (30 mg total) by mouth daily.   DULoxetine (CYMBALTA) 60 MG capsule Take 1 capsule (60 mg total) by mouth daily.   ezetimibe (ZETIA) 10 MG tablet TAKE 1 TABLET(10 MG) BY MOUTH DAILY   fluticasone furoate-vilanterol (BREO ELLIPTA) 200-25 MCG/ACT AEPB Inhale 1 puff into the lungs daily.   furosemide (LASIX) 20 MG tablet TAKE 1 TABLET BY MOUTH DAILY AS NEEDED FOR WEIGHT GAIN OF 2 POUNDS OVERNIGHT OR 5 POUNDS PER WEEK   gabapentin (NEURONTIN) 300 MG capsule Take 1 capsule (300 mg total) by mouth 3 (three) times daily.   gabapentin (NEURONTIN) 800 MG tablet Take 800 mg by mouth at bedtime.   Humidifier/Vaporizer Supplies (VICKS VAPORIZER SCENT) PADS Apply 1 application. topically daily as needed (cough).   insulin lispro (HUMALOG) 100 UNIT/ML KiwkPen Inject 6-10 Units into the skin 3 (three) times daily.  sliding scale 1 unit for every 10 units of carbs   Insulin Pen Needle 32G X 4 MM MISC Inject 1 each into the skin as needed.   JARDIANCE 10 MG TABS tablet TAKE 1 TABLET(10 MG) BY MOUTH DAILY BEFORE BREAKFAST   LANTUS SOLOSTAR 100 UNIT/ML Solostar Pen Inject 20-23 Units into the skin daily at 10 pm. Sliding scale based on blood glucose   levocetirizine (XYZAL) 5 MG tablet TAKE 1 TABLET(5 MG) BY MOUTH EVERY EVENING   levothyroxine (SYNTHROID) 50 MCG tablet TAKE 1 TABLET BY MOUTH EVERY DAY MONDAY THROUGH SATURDAY AND 2 TABLETS EVERY SUNDAY   loratadine (CLARITIN) 10 MG tablet Take 10 mg by mouth in the morning.   metFORMIN (GLUCOPHAGE) 1000 MG tablet Take 1,000 mg by mouth 2 (two) times daily with a meal.   montelukast (SINGULAIR) 10 MG tablet Take 10 mg by mouth at bedtime.   Multiple Vitamins-Minerals (MENS MULTI VITAMIN & MINERAL PO) Take 1 tablet by mouth in  the morning and at bedtime.   naloxone (NARCAN) nasal spray 4 mg/0.1 mL For excess sedation from opioids   nitroGLYCERIN (NITROSTAT) 0.3 MG SL tablet DISSOLVE 1 TABLET UNDER THE TONGUE EVERY 5 MINUTES AS NEEDED FOR CHEST PAIN. MAXIMUM 3 DOSES.   [START ON  09/27/2022] Oxycodone HCl 10 MG TABS Take 1 tablet (10 mg total) by mouth in the morning, at noon, in the evening, and at bedtime.   rosuvastatin (CRESTOR) 5 MG tablet Take 5 mg by mouth daily with supper.   Semaglutide, 1 MG/DOSE, 2 MG/1.5ML SOPN Inject 1 mg into the skin daily at 12 noon.   Current Facility-Administered Medications for the 09/03/22 encounter (Office Visit) with Kathlen Mody, Idalie Canto H, PA-C  Medication   sodium chloride flush (NS) 0.9 % injection 3 mL     Allergies:   Azathioprine and Novolog [insulin aspart]   Social History   Socioeconomic History   Marital status: Married    Spouse name: Neoma Laming   Number of children: 0   Years of education: Not on file   Highest education level: Master's degree (e.g., MA, MS, MEng, MEd, MSW, MBA)  Occupational History   Occupation: disabled    Comment: multiple medical problems - first approved for uncontrolled DM, but now has heart disease and Myasthenia Gravis   Tobacco Use   Smoking status: Never   Smokeless tobacco: Never   Tobacco comments:    smoking cessation materials not required  Vaping Use   Vaping Use: Never used  Substance and Sexual Activity   Alcohol use: Not Currently    Alcohol/week: 0.0 standard drinks of alcohol   Drug use: No   Sexual activity: Not Currently    Partners: Female  Other Topics Concern   Not on file  Social History Narrative   Not on file   Social Determinants of Health   Financial Resource Strain: Low Risk  (10/31/2021)   Overall Financial Resource Strain (CARDIA)    Difficulty of Paying Living Expenses: Not very hard  Food Insecurity: No Food Insecurity (10/31/2021)   Hunger Vital Sign    Worried About Running Out of Food in the Last  Year: Never true    Ran Out of Food in the Last Year: Never true  Transportation Needs: No Transportation Needs (07/24/2021)   PRAPARE - Hydrologist (Medical): No    Lack of Transportation (Non-Medical): No  Physical Activity: Insufficiently Active (10/31/2021)   Exercise Vital Sign    Days of Exercise per Week: 7 days    Minutes of Exercise per Session: 20 min  Stress: Stress Concern Present (10/31/2021)   [place]    Feeling of Stress : To some extent  Social Connections: Socially Integrated (10/31/2021)   Social Connection and Isolation Panel [NHANES]    Frequency of Communication with Friends and Family: Once a week    Frequency of Social Gatherings with Friends and Family: Three times a week    Attends Religious Services: More than 4 times per year    Active Member of Clubs or Organizations: Yes    Attends Music therapist: More than 4 times per year    Marital Status: Married     Family History: The patient's family history includes Cancer in his father; Dementia in his father; Diabetes in his brother and mother; Healthy in his brother and brother; Heart disease in his mother.  ROS:   Please see the history of present illness.     All other systems reviewed and are negative.  EKGs/Labs/Other Studies Reviewed:    The following studies were reviewed today:  Cardiac cath 02/2022    Prox LAD lesion is 20% stenosed.   Prox RCA lesion is 40% stenosed.  Mid RCA lesion is 20% stenosed.   Mid RCA to Dist RCA lesion is 40% stenosed.   Ost 2nd Mrg to 2nd Mrg lesion is 50% stenosed.   Ost 3rd Mrg lesion is 40% stenosed.   RPAV lesion is 80% stenosed.   Non-stenotic Mid LAD lesion was previously treated.   Non-stenotic Mid Cx lesion was previously treated.   Non-stenotic Dist RCA lesion was previously treated.   Non-stenotic 2nd Mrg lesion was previously treated.    Non-stenotic 3rd Mrg lesion was previously treated.   Non-stenotic Ost RPDA to RPDA lesion was previously treated.   There is mild to moderate left ventricular systolic dysfunction.   LV end diastolic pressure is mildly elevated.   The left ventricular ejection fraction is 35-45% by visual estimate.   1.  Widely patent LAD, left circumflex and RCA stents with no obstructive restenosis.  No significant change compared to most recent cardiac catheterization in 2019 with the exception of progression of stenosis in the right posterior AV groove supplying a small area. 2.  Mildly reduced LV systolic function and mildly elevated left ventricular end-diastolic pressure.   Recommendations: Continue aggressive medical therapy.  Coronary Diagrams  Diagnostic Dominance: Right     Echo 01/2022  1. Left ventricular ejection fraction, by estimation, is 45 to 50%. The  left ventricle has mildly decreased function. The left ventricle  demonstrates regional wall motion abnormalities (see scoring  diagram/findings for description). Left ventricular  diastolic parameters are indeterminate. There is severe hypokinesis of the  left ventricular, entire apical segment.   2. Right ventricular systolic function is normal. The right ventricular  size is normal.   3. The mitral valve is degenerative. No evidence of mitral valve  regurgitation.   4. The aortic valve is tricuspid. Aortic valve regurgitation is not  visualized. Aortic valve sclerosis is present, with no evidence of aortic  valve stenosis.   5. Aortic dilatation noted. There is mild dilatation of the aortic root,  measuring 39 mm.     EKG:  EKG is ordered today.  The ekg ordered today demonstrates ST 101bpm, RBBB, anterolateral q waves  Recent Labs: 01/09/2022: BNP 60.6 06/05/2022: Hemoglobin 13.8; Platelets 155 06/24/2022: ALT 14; BUN 22; Creatinine 1.2; Potassium 4.5; Sodium 141; TSH 0.49  Recent Lipid Panel    Component Value Date/Time    CHOL 106 06/24/2022 0000   CHOL 92 (L) 07/17/2021 1117   TRIG 133 06/24/2022 0000   HDL 39 06/24/2022 0000   HDL 28 (L) 07/17/2021 1117   CHOLHDL 3.3 07/17/2021 1117   CHOLHDL 2.8 10/16/2019 1424   VLDL 26 11/29/2017 1154   LDLCALC 40 06/24/2022 0000   LDLCALC 39 07/17/2021 1117   LDLCALC 34 10/16/2019 1424   LDLDIRECT 36 07/17/2021 1117    Physical Exam:    VS:  BP 122/70 (BP Location: Left Arm, Patient Position: Sitting, Cuff Size: Normal)   Pulse (!) 101   Ht '5\' 7"'$  (1.702 m)   Wt 190 lb 8 oz (86.4 kg)   SpO2 98%   BMI 29.84 kg/m     Wt Readings from Last 3 Encounters:  09/03/22 190 lb 8 oz (86.4 kg)  08/17/22 185 lb (83.9 kg)  07/20/22 194 lb (88 kg)     GEN:  Well nourished, well developed in no acute distress HEENT: Normal NECK: No JVD; No carotid bruits LYMPHATICS: No lymphadenopathy CARDIAC: RRR, no murmurs, rubs, gallops RESPIRATORY:  Clear to auscultation without rales, wheezing or rhonchi  ABDOMEN:  Soft, non-tender, non-distended MUSCULOSKELETAL:  No edema; No deformity  SKIN: Warm and dry NEUROLOGIC:  Alert and oriented x 3 PSYCHIATRIC:  Normal affect   ASSESSMENT:    1. Coronary artery disease involving native coronary artery of native heart without angina pectoris   2. Ischemic cardiomyopathy   3. HFrEF (heart failure with reduced ejection fraction) ([place])   4. Hyperlipidemia LDL goal <70   5. Orthostatic hypotension   6. Essential hypertension   7. Hyperlipidemia, mixed   8. OSA on CPAP    PLAN:    In order of problems listed above:  CAD with multiple prior stents Patient reports occasional chest pain with agitation and exertion. He takes SL NTG when he experiences the pain. LHC in May 2023 showed patent stents with minimal ISR and some progression of small vessel disease involving the RPAV. He is off BB due to orthostatic hypotension. We discussed Ranexa, but patient is not wanting another medication at this time. He will work on adjusting  precipitating events for his chest pain.   Chronic HFrEF ICM Echo in April 2023 showed LVEF 40-45% with apical hypokinesis and normal RV function. He has been doing well at home using lasix a couple times a week. Continue Jardiance. GDMT limited by orthostatic hypotension  Orthostatic hypotension He reports dizziness upon standing. Orthostatics positive. He is no longer on metoprolol. I encouraged compression socks/thigh high stockings and abdominal binder. Would be careful with salt intake given HFrEF.   HTN BP good today. Not on antihypertensives at baseline  HLD LDL 40. Continue Crestor 5 mg daily.   OSA He reports compliance with CPAP.  Disposition: Follow up in 3 month(s) with MD/APP    Signed, Samyria Rudie Ninfa Meeker, PA-C  09/04/2022 1:43 PM    [facility]

## 2022-09-07 ENCOUNTER — Ambulatory Visit: Payer: Medicare Other | Admitting: Podiatry

## 2022-10-01 LAB — HM DIABETES EYE EXAM

## 2022-10-02 ENCOUNTER — Telehealth: Payer: Self-pay | Admitting: Family Medicine

## 2022-10-02 DIAGNOSIS — F331 Major depressive disorder, recurrent, moderate: Secondary | ICD-10-CM

## 2022-10-02 DIAGNOSIS — F411 Generalized anxiety disorder: Secondary | ICD-10-CM

## 2022-10-02 NOTE — Telephone Encounter (Signed)
Pt is calling back - He reports taking a '60mg'$  at night. And '30mg'$  in the morning.  CB- [phone]

## 2022-10-05 ENCOUNTER — Other Ambulatory Visit: Payer: Self-pay | Admitting: Cardiovascular Disease

## 2022-10-05 ENCOUNTER — Encounter: Payer: Self-pay | Admitting: Oncology

## 2022-10-07 ENCOUNTER — Telehealth: Payer: Self-pay | Admitting: Cardiovascular Disease

## 2022-10-07 ENCOUNTER — Other Ambulatory Visit: Payer: Self-pay | Admitting: Medical

## 2022-10-07 ENCOUNTER — Other Ambulatory Visit: Payer: Self-pay | Admitting: Gastroenterology

## 2022-10-07 DIAGNOSIS — I5022 Chronic systolic (congestive) heart failure: Secondary | ICD-10-CM

## 2022-10-07 MED ORDER — EMPAGLIFLOZIN 10 MG PO TABS: ORAL_TABLET | ORAL | 0 refills | Status: DC

## 2022-10-07 NOTE — Telephone Encounter (Signed)
*  STAT* If patient is at the pharmacy, call can be transferred to refill team.   1. Which medications need to be refilled? (please list name of each medication and dose if known)    JARDIANCE 10 MG TABS tablet    2. Which pharmacy/location (including street and city if local pharmacy) is medication to be sent to? [facility] [place], [place]   3. Do they need a 30 day or 90 day supply? [place]

## 2022-10-07 NOTE — Telephone Encounter (Signed)
Requested Prescriptions   Signed Prescriptions Disp Refills   empagliflozin (JARDIANCE) 10 MG TABS tablet 30 tablet 0    Sig: TAKE 1 TABLET(10 MG) BY MOUTH DAILY BEFORE BREAKFAST    Authorizing Provider: [name] A    Ordering User: Britt Bottom

## 2022-10-12 ENCOUNTER — Ambulatory Visit: Payer: Commercial Managed Care - HMO | Attending: Anesthesiology | Admitting: Anesthesiology

## 2022-10-12 DIAGNOSIS — F1190 Opioid use, unspecified, uncomplicated: Secondary | ICD-10-CM

## 2022-10-12 DIAGNOSIS — M5136 Other intervertebral disc degeneration, lumbar region: Secondary | ICD-10-CM | POA: Diagnosis not present

## 2022-10-12 DIAGNOSIS — M4716 Other spondylosis with myelopathy, lumbar region: Secondary | ICD-10-CM

## 2022-10-12 DIAGNOSIS — M542 Cervicalgia: Secondary | ICD-10-CM

## 2022-10-12 DIAGNOSIS — G894 Chronic pain syndrome: Secondary | ICD-10-CM

## 2022-10-12 DIAGNOSIS — R29898 Other symptoms and signs involving the musculoskeletal system: Secondary | ICD-10-CM | POA: Diagnosis not present

## 2022-10-12 DIAGNOSIS — M47817 Spondylosis without myelopathy or radiculopathy, lumbosacral region: Secondary | ICD-10-CM

## 2022-10-12 DIAGNOSIS — M5432 Sciatica, left side: Secondary | ICD-10-CM

## 2022-10-12 DIAGNOSIS — M545 Low back pain, unspecified: Secondary | ICD-10-CM

## 2022-10-12 DIAGNOSIS — Z79891 Long term (current) use of opiate analgesic: Secondary | ICD-10-CM

## 2022-10-12 MED ORDER — OXYCODONE HCL 10 MG PO TABS: 10.0000 mg | ORAL_TABLET | Freq: Four times a day (QID) | ORAL | 0 refills | Status: AC

## 2022-10-12 MED ORDER — OXYCODONE HCL 10 MG PO TABS: 10.0000 mg | ORAL_TABLET | Freq: Four times a day (QID) | ORAL | 0 refills | Status: DC

## 2022-10-12 NOTE — Progress Notes (Signed)
Virtual Visit via Telephone Note  I connected with Edgar MAZZOCCO on 10/12/22 at  1:00 PM EST by telephone and verified that I am speaking with the correct person using two identifiers.  Location: Patient: Home Provider: [facility]   I discussed the limitations, risks, security and privacy concerns of performing an evaluation and management service by telephone and the availability of in person appointments. I also discussed with the patient that there may be a patient responsible charge related to this service. The patient expressed understanding and agreed to proceed.   History of Present Illness: Spoke with [name] for his follow-up evaluation.  We did this via telephone as he was able to do the video portion of the conference.  He reports that following his last epidural he did quite well with almost complete pain relief of his sciatica symptoms in the left leg for about 8 weeks.  He is gradually having some return of a similar type pain over the last few days comparable to what he is experienced in the past.  He generally gets good relief with the epidural injections and requires these about every 3 to 6 months.  He continues to do his stretching strengthening exercises but despite this the pain persist.  He still takes his medications as prescribed for chronic low back pain and is doing well with this without side effect.  The quality characteristic and distribution of the pain also is stable in nature with no change in lower extremity strength or function.  He has chronic left arm weakness and left lower extremity weakness which has been stable in nature with a history of myasthenia gravis as well.  Review of systems: General: No fevers or chills Pulmonary: No shortness of breath or dyspnea Cardiac: No angina or palpitations or lightheadedness GI: No abdominal pain or constipation Psych: No depression    Observations/Objective:  Current Outpatient Medications:    [START  ON 11/25/2022] Oxycodone HCl 10 MG TABS, Take 1 tablet (10 mg total) by mouth in the morning, at noon, in the evening, and at bedtime., Disp: 120 tablet, Rfl: 0   albuterol (PROVENTIL) (2.5 MG/3ML) 0.083% nebulizer solution, Take 3 mLs (2.5 mg total) by nebulization every 6 (six) hours as needed for wheezing or shortness of breath., Disp: 75 mL, Rfl: 2   albuterol (VENTOLIN HFA) 108 (90 Base) MCG/ACT inhaler, Inhale 2 puffs into the lungs daily., Disp: , Rfl:    ammonium lactate (AMLACTIN) 12 % lotion, Apply 1 application topically as needed for dry skin., Disp: 400 g, Rfl: 3   aspirin EC 81 MG tablet, Take 1 tablet (81 mg total) by mouth daily., Disp: 90 tablet, Rfl: 3   clopidogrel (PLAVIX) 75 MG tablet, Take 1 tablet (75 mg) by mouth daily with breakfast, Disp: 30 tablet, Rfl: 6   cyclobenzaprine (FLEXERIL) 10 MG tablet, TAKE 1 TABLET(10 MG) BY MOUTH TWICE DAILY, Disp: 60 tablet, Rfl: 5   dexlansoprazole (DEXILANT) 60 MG capsule, TAKE 1 CAPSULE(60 MG) BY MOUTH DAILY, Disp: 30 capsule, Rfl: 0   DULoxetine (CYMBALTA) 30 MG capsule, Take 1 capsule (30 mg total) by mouth daily., Disp: 90 capsule, Rfl: 1   DULoxetine (CYMBALTA) 60 MG capsule, TAKE 1 CAPSULE(60 MG) BY MOUTH DAILY, Disp: 90 capsule, Rfl: 1   empagliflozin (JARDIANCE) 10 MG TABS tablet, TAKE 1 TABLET(10 MG) BY MOUTH DAILY BEFORE BREAKFAST, Disp: 30 tablet, Rfl: 0   ezetimibe (ZETIA) 10 MG tablet, TAKE 1 TABLET(10 MG) BY MOUTH DAILY, Disp: 90  tablet, Rfl: 0   fluticasone furoate-vilanterol (BREO ELLIPTA) 200-25 MCG/ACT AEPB, Inhale 1 puff into the lungs daily., Disp: , Rfl:    furosemide (LASIX) 20 MG tablet, TAKE 1 TABLET BY MOUTH EVERY DAY AS NEEDED FOR WEIGHT GAIN OF 2 POUNDS OVERNIGHT OR 5 PUNDS PER WEEK, NO ADDITIONAL REFILL UNTIL OFFICE VISIT!!, Disp: 60 tablet, Rfl: 0   gabapentin (NEURONTIN) 300 MG capsule, Take 1 capsule (300 mg total) by mouth 3 (three) times daily., Disp: 90 capsule, Rfl: 1   gabapentin (NEURONTIN) 800 MG tablet,  Take 800 mg by mouth at bedtime., Disp: , Rfl:    Humidifier/Vaporizer Supplies (VICKS VAPORIZER SCENT) PADS, Apply 1 application. topically daily as needed (cough)., Disp: , Rfl:    insulin lispro (HUMALOG) 100 UNIT/ML KiwkPen, Inject 6-10 Units into the skin 3 (three) times daily.  sliding scale 1 unit for every 10 units of carbs, Disp: , Rfl:    Insulin Pen Needle 32G X 4 MM MISC, Inject 1 each into the skin as needed., Disp: , Rfl:    LANTUS SOLOSTAR 100 UNIT/ML Solostar Pen, Inject 20-23 Units into the skin daily at 10 pm. Sliding scale based on blood glucose, Disp: , Rfl: 0   levocetirizine (XYZAL) 5 MG tablet, TAKE 1 TABLET(5 MG) BY MOUTH EVERY EVENING, Disp: 90 tablet, Rfl: 1   levothyroxine (SYNTHROID) 50 MCG tablet, TAKE 1 TABLET BY MOUTH EVERY DAY MONDAY THROUGH SATURDAY AND 2 TABLETS EVERY SUNDAY, Disp: 102 tablet, Rfl: 0   loratadine (CLARITIN) 10 MG tablet, Take 10 mg by mouth in the morning., Disp: , Rfl:    metFORMIN (GLUCOPHAGE) 1000 MG tablet, Take 1,000 mg by mouth 2 (two) times daily with a meal., Disp: , Rfl:    montelukast (SINGULAIR) 10 MG tablet, Take 10 mg by mouth at bedtime., Disp: , Rfl:    Multiple Vitamins-Minerals (MENS MULTI VITAMIN & MINERAL PO), Take 1 tablet by mouth in the morning and at bedtime., Disp: , Rfl:    naloxone (NARCAN) nasal spray 4 mg/0.1 mL, For excess sedation from opioids, Disp: 1 kit, Rfl: 2   nitroGLYCERIN (NITROSTAT) 0.3 MG SL tablet, DISSOLVE 1 TABLET UNDER THE TONGUE EVERY 5 MINUTES AS NEEDED FOR CHEST PAIN. MAXIMUM 3 DOSES., Disp: 100 tablet, Rfl: 3   [START ON 10/26/2022] Oxycodone HCl 10 MG TABS, Take 1 tablet (10 mg total) by mouth in the morning, at noon, in the evening, and at bedtime., Disp: 120 tablet, Rfl: 0   rosuvastatin (CRESTOR) 5 MG tablet, Take 5 mg by mouth daily with supper., Disp: , Rfl: 0   Semaglutide, 1 MG/DOSE, 2 MG/1.5ML SOPN, Inject 1 mg into the skin daily at 12 noon., Disp: , Rfl:   Current Facility-Administered  Medications:    sodium chloride flush (NS) 0.9 % injection 3 mL, 3 mL, Intravenous, Q12H, Furth, Cadence H, PA-C   Past Medical History:  Diagnosis Date   Allergy    dust, seasonal (worse in the fall).   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 Followed by Pain Specialist in [place], back and neck   Asthma    BRONCHITIS   Cataract    First Dx in 2012   Chronic combined systolic and diastolic congestive heart failure ([place])    a. 03/2018 Echo: EF 30-35%; b. 07/2018 Echo: EF 35-40%; c. 09/2019 TEE: EF 40-45%; d. 12/2020 Echo: EF 35-40%, sev apical ant, apical lat, apical inf, apical HK. GrI DD, nl RV fxn.e.01/2022  Echo: EF 45-50%.  Coronary artery disease    a. Prior Ant MI->s/p mult stents->LAD/RCA ([place]); b. 2016 Cath: nonobs dzs;  c. 04/2018 Cath/PCI: LCX 8m(3.25x15 [place]); d. 02/2022 MV: high risk; e. 02/2022 Cath: LM nl, LAD 20p ISR, patent mid-stent, LCX patent stent, OM1 nl, OM2 50p, patent stent, OM3 40p, patent stent, RCA 40p ISR, 275mSR, 4037m patent distal stent, RPDA patent stent, RPAV small, 80 (sl progression)-->Med Rx.   Deaf, left    Diabetes mellitus without complication ([facility]  TYPE 2   Diabetic peripheral neuropathy ([facility]  feet and hands   FUO (fever of unknown origin) 08/03/2018   GERD (gastroesophageal reflux disease)    Headache    muscle tension   Hyperlipidemia    Hypertension    Hyperthyroidism    Insomnia    Ischemic cardiomyopathy    a. 03/2018 Echo: EF 30-35%; b. 07/2018 Echo: EF 35-40%, Gr1 DD; c. 09/2019 TEE: EF 40-45%; d. 12/2020 Echo: EF 35-40%. GrI DD; e. 01/2022 Echo: EF 45-50%, no rwma, sev apical HK, nl RV fxn, Ao root 83m35m Knee pain, acute 05/06/2020   Left arm weakness 10/04/2019   Left leg weakness 12/01/2019   Lyme disease    Chronic   Myasthenia gravis (HCC)[place] (03/18/21 - no current treatment - "better than it has ever been" per pt)   Myocardial infarction (HCC)[place]   Palpitations    a. 10/2020 [facility]: RSR, 88 avg. 3 brief SVT episodes  (max 5 beats @ 128). Rare PACs/PVCs. Triggered events did not correlate w/ significant arrthymia - some w/ sinus tach.   Seasonal allergies    Sepsis (HCC)[place] a.07/2018 - unknown source. TEE neg for veg 09/2019.   Sleep apnea    CPAP   Wears hearing aid in both ears      Assessment and Plan: 1. DDD (degenerative disc disease), lumbar   2. Sciatica, left side   3. Left leg weakness   4. Lumbar spondylosis with myelopathy   5. Chronic pain syndrome   6. Cervicalgia   7. Chronic, continuous use of opioids   8. Facet arthritis of lumbosacral region   9. Low back pain at multiple sites   Based on our discussion today I think it is appropriate to schedule him for repeat epidural at his next visit in 2 months.  He has done very well with epidural injections and generally gets good relief from a chronic intractable pain.  Unfortunately nothing else has been effective for him and he is considered a nonsurgical candidate.  He continues to take his medications as prescribed and these continue to work well for him with no side effects for his failed more conservative therapy.  Will schedule an epidural at his next visit in 2 months and I have gone over some stretching strengthening exercises to review with him.  Continue follow-up with his primary care physicians for baseline medical care with return in 2 months  Follow Up Instructions:    I discussed the assessment and treatment plan with the patient. The patient was provided an opportunity to ask questions and all were answered. The patient agreed with the plan and demonstrated an understanding of the instructions.   The patient was advised to call back or seek an in-person evaluation if the symptoms worsen or if the condition fails to improve as anticipated.  I provided 30 minutes of non-face-to-face time during this encounter.   JameMolli Barrows

## 2022-10-13 ENCOUNTER — Encounter: Payer: Self-pay | Admitting: Oncology

## 2022-10-19 NOTE — Progress Notes (Unsigned)
Name: [name]   MRN: [id number]    DOB: Jun 01, 1961   Date:10/20/2022       Progress Note  Subjective  Chief Complaint  Follow Up  HPI  Asthma in 2023 he was treated 5 times with prednisone for asthma flares and once in 06/2022 with antibiotics and also prednisone due to bronchitis. He is still under the care of Dr. Raul Del . He had cold symptoms last week described as fatigue, mild increase in cough and chills. He had some increase in SOB last week, he states he felt better but now is noticing some chest congestion. No fever or chills at this time     DMII:  he is under the care of Dr. Honor Junes , A1C was 8.4 % was down to 7.9 % went up to  8.3 %  7.6 % last one in June was 7.8 % lat visit with Endo was Sept and A1C was down to 7.7 %  He has associated HTN, dyslipidemia, neuropathy.  He is on statin therapy,gabapentin for neuropathy . Taking insulin, Ozempic ,  Metformin , SGL2 agonist.   Myasthenia Gravis: he is currently seeing Dr. Melrose Nakayama, no longer taking medication, he has mild ptosis on left side but not affecting his vision, he has sometimes generalized weakness but not sure if myathenia  CHF/CAD with angina: under the care of cardiologist, he is on statin therapy, SGL-2 agonist, lasix every other and NTG a few times a month when he has chest pain. He used to take ARB and beta blocker but medications were stopped due to low bp. He denies orthopnea or lower extremity edema  Chronic Lyme disease: he has joint aches, it was diagnosed many years ago in [place], stable    Hypothyroidism: he is currently on 50 mcg of levothyroxine but two on Sundays. TSH has been at goal . Denies dysphagia, dry skin and constipation is stable.    Malnutrition: he lost 21 lbs within the last year that was a 10 % weight loss in one year and another 5 lbs since last visit in October .He has started protein shakes, he will also discuss change in diabetes therapy with Endo tomorrow, he has decreased the amount of  insulin he uses daily but still having hypoglycemic episodes   MDD: he has a long history of depression, taking Duloxetine 90 mg daily , it works well for him. He is struggling about his father having Alzheimer's , there is a lot of drama within the family.  Chronic pain/inflammatory spondyloarthropathy:  he sees pain sub-specialist, Dr. [name],  every 2 months. He is going to ask if he can switch from oxycodone to something longer acting. He has daily back pain , he states lately pain is going down both legs and has numbness and weakness on both lower legs when he first stands up. He has steroid injections a few times a year, recently noticing right buttocks pain, pain is intense at times, intermittent and not sure of the triggers, discussed sitting on lacrosse ball  IMPRESSION: No findings of discitis or osteomyelitis. Mild multilevel lumbar spondylosis, primarily facet arthropathy, with some disc space narrowing L2-L3.   Congenital lumbar stenosis with short pedicles, but no compressive features.  Atherosclerosis of aorta: found on CT chest done back in 2019, taking statin therapy and aspirin Last LDL was at goal at 40   Patient Active Problem List   Diagnosis Date Noted   Asthma, moderate persistent, poorly-controlled 07/20/2022   Mild protein-calorie malnutrition ([place])  07/20/2022   Atherosclerosis of aorta ([place]) 07/20/2022   Abnormal nuclear cardiac imaging test    Seasonal allergies 01/27/2022   Hypothyroidism, acquired, autoimmune 01/27/2022   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 ([place]) 12/02/2021   Gastropathy 08/13/2021   Iron deficiency anemia due to chronic blood loss    Pain due to onychomycosis of toenails of both feet 11/11/2020   Lung involvement associated with another disorder ([place]) 05/17/2020   Lower urinary tract symptoms 09/15/2019   Incomplete bladder emptying 09/15/2019   Carpal tunnel syndrome on both sides 07/12/2019   Idiopathic peripheral neuropathy 04/21/2019    Sensory ataxia 03/09/2019   OSA on CPAP 07/08/2018   Congestive heart failure (CHF) ([place]) 05/05/2018   Unstable angina ([place]) 04/26/2018   Ischemic cardiomyopathy 04/26/2018   Chronic combined systolic and diastolic CHF (congestive heart failure) ([place]) 04/26/2018   Anginal equivalent 04/25/2018   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[place]) 01/25/2018   Vitamin D deficiency, unspecified 08/12/2017   Ptosis of left eyelid 01/08/2017   Dermatitis 12/24/2016   Benign neoplasm of sigmoid colon    Benign neoplasm of descending colon    Benign neoplasm of transverse colon    Coronary artery disease involving native coronary artery with angina pectoris ([place]) 10/22/2015   Coronary artery disease 08/22/2015   Myasthenia gravis ([place]) 08/22/2015   Chronic left-sided low back pain 08/22/2015   Hypertension 08/22/2015   GERD (gastroesophageal reflux disease) 08/22/2015   Hyperlipidemia 08/22/2015   Insomnia 08/22/2015   Type 2 diabetes mellitus with diabetic polyneuropathy, with long-term current use of insulin ([place]) 08/22/2015   Asthma 08/22/2015   Lyme disease 06/05/1992    Past Surgical History:  Procedure Laterality Date   BILATERAL CARPAL TUNNEL RELEASE Bilateral L in 2012 and R in 2013   [place], most recent in  March 2016.   COLONOSCOPY WITH PROPOFOL N/A 01/10/2016   Procedure: COLONOSCOPY WITH PROPOFOL;  Surgeon: [name], MD;  Location: [facility] END[facility];  Service: Endoscopy;  Laterality: N/A;   COLONOSCOPY WITH PROPOFOL N/A 04/14/2021   Procedure: COLONOSCOPY WITH PROPOFOL;  Surgeon: [name], MD;  Location: [place];  Service: Endoscopy;  Laterality: N/A;  Diabetic - insulin and oral meds   CORONARY ANGIOPLASTY     CORONARY STENT INTERVENTION N/A 04/25/2018   Procedure: CORONARY STENT INTERVENTION;  Surgeon: [name], MD;  Location: [place] [facility];  Service: Cardiovascular;  Laterality: N/A;    ESOPHAGOGASTRODUODENOSCOPY (EGD) WITH PROPOFOL N/A 01/10/2016   Procedure: ESOPHAGOGASTRODUODENOSCOPY (EGD) WITH PROPOFOL;  Surgeon: [name], MD;  Location: [facility];  Service: Endoscopy;  Laterality: N/A;   ESOPHAGOGASTRODUODENOSCOPY (EGD) WITH PROPOFOL N/A 04/14/2021   Procedure: ESOPHAGOGASTRODUODENOSCOPY (EGD) WITH BIOPSY;  Surgeon: [name], MD;  Location: [place];  Service: Endoscopy;  Laterality: N/A;   EYE SURGERY Bilateral 2012   cataract/bilateral vitrectomies   GIVENS CAPSULE STUDY N/A 08/20/2021   Procedure: GIVENS CAPSULE STUDY;  Surgeon: [name], MD;  Location: [facility]COPY;  Service: Endoscopy;  Laterality: N/A;   LEFT HEART CATH AND CORONARY ANGIOGRAPHY Left 04/25/2018   Procedure: LEFT HEART CATH AND CORONARY ANGIOGRAPHY;  Surgeon: [name], MD;  Location: [place] [facility];  Service: Cardiovascular;  Laterality: Left;   LEFT HEART CATH AND CORONARY ANGIOGRAPHY Left 02/02/2022   Procedure: LEFT HEART CATH AND CORONARY ANGIOGRAPHY;  Surgeon: [name], MD;  Location: [place] [facility];  Service: Cardiovascular;  Laterality: Left;   TEE WITHOUT CARDIOVERSION  N/A 09/05/2018   Procedure: TRANSESOPHAGEAL ECHOCARDIOGRAM (TEE);  Surgeon: [name], MD;  Location: [facility];  Service: Cardiovascular;  Laterality: N/A;   TONSILLECTOMY AND ADENOIDECTOMY     As a child   TUNNELED VENOUS CATHETER PLACEMENT     removed    Family History  Problem Relation Age of Onset   Diabetes Mother    Heart disease Mother    Cancer Father        Prostate CA, Anal cancer    Dementia Father    Diabetes Brother    Healthy Brother    Healthy Brother     Social History   Tobacco Use   Smoking status: Never   Smokeless tobacco: Never   Tobacco comments:    smoking cessation materials not required  Substance Use Topics   Alcohol use: Not Currently    Alcohol/week: 0.0 standard drinks of alcohol     Current Outpatient Medications:     albuterol (PROVENTIL) (2.5 MG/3ML) 0.083% nebulizer solution, Take 3 mLs (2.5 mg total) by nebulization every 6 (six) hours as needed for wheezing or shortness of breath., Disp: 75 mL, Rfl: 2   albuterol (VENTOLIN HFA) 108 (90 Base) MCG/ACT inhaler, Inhale 2 puffs into the lungs daily., Disp: , Rfl:    ammonium lactate (AMLACTIN) 12 % lotion, Apply 1 application topically as needed for dry skin., Disp: 400 g, Rfl: 3   aspirin EC 81 MG tablet, Take 1 tablet (81 mg total) by mouth daily., Disp: 90 tablet, Rfl: 3   clopidogrel (PLAVIX) 75 MG tablet, Take 1 tablet (75 mg) by mouth daily with breakfast, Disp: 30 tablet, Rfl: 6   cyclobenzaprine (FLEXERIL) 10 MG tablet, TAKE 1 TABLET(10 MG) BY MOUTH TWICE DAILY, Disp: 60 tablet, Rfl: 5   dexlansoprazole (DEXILANT) 60 MG capsule, TAKE 1 CAPSULE(60 MG) BY MOUTH DAILY, Disp: 30 capsule, Rfl: 0   DULoxetine (CYMBALTA) 30 MG capsule, Take 1 capsule (30 mg total) by mouth daily., Disp: 90 capsule, Rfl: 1   DULoxetine (CYMBALTA) 60 MG capsule, TAKE 1 CAPSULE(60 MG) BY MOUTH DAILY, Disp: 90 capsule, Rfl: 1   empagliflozin (JARDIANCE) 10 MG TABS tablet, TAKE 1 TABLET(10 MG) BY MOUTH DAILY BEFORE BREAKFAST, Disp: 30 tablet, Rfl: 0   ezetimibe (ZETIA) 10 MG tablet, TAKE 1 TABLET(10 MG) BY MOUTH DAILY, Disp: 90 tablet, Rfl: 0   fluticasone furoate-vilanterol (BREO ELLIPTA) 200-25 MCG/ACT AEPB, Inhale 1 puff into the lungs daily., Disp: , Rfl:    furosemide (LASIX) 20 MG tablet, TAKE 1 TABLET BY MOUTH EVERY DAY AS NEEDED FOR WEIGHT GAIN OF 2 POUNDS OVERNIGHT OR 5 PUNDS PER WEEK, NO ADDITIONAL REFILL UNTIL OFFICE VISIT!!, Disp: 60 tablet, Rfl: 0   gabapentin (NEURONTIN) 300 MG capsule, Take 1 capsule (300 mg total) by mouth 3 (three) times daily., Disp: 90 capsule, Rfl: 1   gabapentin (NEURONTIN) 800 MG tablet, Take 800 mg by mouth at bedtime., Disp: , Rfl:    Humidifier/Vaporizer Supplies (VICKS VAPORIZER SCENT) PADS, Apply 1 application. topically daily as needed  (cough)., Disp: , Rfl:    insulin lispro (HUMALOG) 100 UNIT/ML KiwkPen, Inject 6-10 Units into the skin 3 (three) times daily.  sliding scale 1 unit for every 10 units of carbs, Disp: , Rfl:    Insulin Pen Needle 32G X 4 MM MISC, Inject 1 each into the skin as needed., Disp: , Rfl:    LANTUS SOLOSTAR 100 UNIT/ML Solostar Pen, Inject 20-23 Units into the skin daily at 10 pm. Sliding  scale based on blood glucose, Disp: , Rfl: 0   levocetirizine (XYZAL) 5 MG tablet, TAKE 1 TABLET(5 MG) BY MOUTH EVERY EVENING, Disp: 90 tablet, Rfl: 1   levothyroxine (SYNTHROID) 50 MCG tablet, TAKE 1 TABLET BY MOUTH EVERY DAY MONDAY THROUGH SATURDAY AND 2 TABLETS EVERY SUNDAY, Disp: 102 tablet, Rfl: 0   loratadine (CLARITIN) 10 MG tablet, Take 10 mg by mouth in the morning., Disp: , Rfl:    metFORMIN (GLUCOPHAGE) 1000 MG tablet, Take 1,000 mg by mouth 2 (two) times daily with a meal., Disp: , Rfl:    montelukast (SINGULAIR) 10 MG tablet, Take 10 mg by mouth at bedtime., Disp: , Rfl:    Multiple Vitamins-Minerals (MENS MULTI VITAMIN & MINERAL PO), Take 1 tablet by mouth in the morning and at bedtime., Disp: , Rfl:    naloxone (NARCAN) nasal spray 4 mg/0.1 mL, For excess sedation from opioids, Disp: 1 kit, Rfl: 2   nitroGLYCERIN (NITROSTAT) 0.3 MG SL tablet, DISSOLVE 1 TABLET UNDER THE TONGUE EVERY 5 MINUTES AS NEEDED FOR CHEST PAIN. MAXIMUM 3 DOSES., Disp: 100 tablet, Rfl: 3   [START ON 10/26/2022] Oxycodone HCl 10 MG TABS, Take 1 tablet (10 mg total) by mouth in the morning, at noon, in the evening, and at bedtime., Disp: 120 tablet, Rfl: 0   [START ON 11/25/2022] Oxycodone HCl 10 MG TABS, Take 1 tablet (10 mg total) by mouth in the morning, at noon, in the evening, and at bedtime., Disp: 120 tablet, Rfl: 0   rosuvastatin (CRESTOR) 5 MG tablet, Take 5 mg by mouth daily with supper., Disp: , Rfl: 0   Semaglutide, 1 MG/DOSE, 2 MG/1.5ML SOPN, Inject 1 mg into the skin daily at 12 noon., Disp: , Rfl:   Current  Facility-Administered Medications:    sodium chloride flush (NS) 0.9 % injection 3 mL, 3 mL, Intravenous, Q12H, Furth, Cadence H, PA-C  Allergies  Allergen Reactions   Azathioprine Other (See Comments)    Azathioprine hypersensitivity reaction - Symptoms mimicking sepsis - was hospitalized   Novolog [Insulin Aspart] Hives    I personally reviewed active problem list, medication list, allergies, family history, social history, health maintenance with the patient/caregiver today.   ROS  Constitutional: Negative for fever, positive for weight change.  Respiratory: positive  for cough and shortness of breath.   Cardiovascular: Negative for chest pain or palpitations.  Gastrointestinal: Negative for abdominal pain, no bowel changes.  Musculoskeletal: positive for gait problem or joint swelling.  Skin: Negative for rash.  Neurological: Negative for dizziness or headache.  No other specific complaints in a complete review of systems (except as listed in HPI above).   Objective  Vitals:   10/20/22 1125  BP: 120/72  Pulse: 82  Resp: 16  SpO2: 95%  Weight: 189 lb (85.7 kg)  Height: '5\' 7"'$  (1.702 m)    Body mass index is 29.6 kg/m.  Physical Exam  Constitutional: Patient appears well-developed and well-nourished. No distress.  HEENT: head atraumatic, normocephalic, pupils equal and reactive to light, neck supple Cardiovascular: Normal rate, regular rhythm and normal heart sounds.  No murmur heard. No BLE edema. Pulmonary/Chest: Effort normal , inspiratory rhonchi and expiratory wheezing.  No respiratory distress. Abdominal: Soft.  There is no tenderness. Psychiatric: Patient has a normal mood and affect. behavior is normal. Judgment and thought content normal.   Recent Results (from the past 2160 hour(s))  ToxASSURE Select 13 (MW), Urine     Status: None   Collection Time: 08/17/22  3:39 PM  Result Value Ref Range   Summary Note     Comment:  ==================================================================== ToxASSURE Select 13 (MW) ==================================================================== Specimen Alert Note: Urinary creatinine is low; ability to detect some drugs may be compromised. Interpret results with caution. (Creatinine) ==================================================================== Test                             Result       Flag       Units  Drug Present and Declared for Prescription Verification   Oxycodone                      2153         EXPECTED   ng/mg creat   Oxymorphone                    507          EXPECTED   ng/mg creat   Noroxycodone                   6620         EXPECTED   ng/mg creat    Sources of oxycodone include scheduled prescription medications.    Oxymorphone and noroxycodone are expected metabolites of oxycodone.    Oxymorphone is also available as a scheduled prescription medication.  ====================================================================  Test                      Result    Flag   Units      Ref Range   Creatinine              15        LL     mg/dL      >=20 ==================================================================== Declared Medications:  The flagging and interpretation on this report are based on the  following declared medications.  Unexpected results may arise from  inaccuracies in the declared medications.   **Note: The testing scope of this panel includes these medications:   Oxycodone   **Note: The testing scope of this panel does not include the  following reported medications:   Albuterol (Proventil HFA)  Ammonium Hydroxide (Ammonium Lactate)  Aspirin  Clopidogrel (Plavix)  Cyclobenzaprine (Flexeril)  Dexlansoprazole (Dexilant)  Duloxetine (Cymbalta)  Empagliflozin (Jardiance)  Ezetimibe (Zetia)  Fluticasone (Breo)  Furosemide (Lasix)  Gabapentin (Neurontin)  Insulin (Lantus)  Lactic Acid (Ammonium Lactate)   Levocetirizine (Xyzal)  Levothyroxine (Synthroid)  Loratadine  (Claritin)  Metformin (Glucophage)  Montelukast (Singulair)  Multivitamin  Naloxone (Narcan)  Nitroglycerin (Nitrostat)  Rosuvastatin (Crestor)  Semaglutide  Topical  Vilanterol (Breo) ==================================================================== For clinical consultation, please call [phone]. ====================================================================   HM DIABETES EYE EXAM     Status: Abnormal   Collection Time: 10/01/22 12:00 AM  Result Value Ref Range   HM Diabetic Eye Exam Retinopathy (A) No Retinopathy    PHQ2/9:    10/20/2022   11:25 AM 08/17/2022   12:59 PM 07/20/2022    1:41 PM 07/02/2022    2:09 PM 06/12/2022   10:43 AM  Depression screen PHQ 2/9  Decreased Interest 0 0 0 1 0  Down, Depressed, Hopeless 1 0 '1 1 1  '$ PHQ - 2 Score 1 0 '1 2 1  '$ Altered sleeping 0  1 0 3  Tired, decreased energy 3  1 0 3  Change in appetite 0  0 3 0  Feeling bad or failure  about yourself  0  0 0 0  Trouble concentrating 0  0 0 0  Moving slowly or fidgety/restless 0  0 0 0  Suicidal thoughts 0  0 0 0  PHQ-9 Score '4  3 5 7  '$ Difficult doing work/chores    Not difficult at all     phq 9 is positive   Fall Risk:    10/20/2022   11:25 AM 08/17/2022   12:59 PM 07/20/2022    1:32 PM 07/02/2022    2:09 PM 06/12/2022   10:37 AM  Fall Risk   Falls in the past year? 1 0 '1 1 1  '$ Number falls in past yr: 0  '1 1 1  '$ Injury with Fall? 0  1 1 0  Risk for fall due to : Impaired balance/gait  Impaired balance/gait Impaired balance/gait;Impaired mobility Impaired mobility;Impaired balance/gait  Follow up Falls prevention discussed  Falls prevention discussed  Falls prevention discussed      Functional Status Survey: Is the patient deaf or have difficulty hearing?: Yes Does the patient have difficulty seeing, even when wearing glasses/contacts?: Yes Does the patient have difficulty concentrating, remembering,  or making decisions?: No Does the patient have difficulty walking or climbing stairs?: Yes Does the patient have difficulty dressing or bathing?: No Does the patient have difficulty doing errands alone such as visiting a doctor's office or shopping?: Yes    Assessment & Plan  1. Type 2 diabetes mellitus with diabetic polyneuropathy, with long-term current use of insulin ([place])  Compliant with treatment   2. Myasthenia gravis ([place])  stable  3. Mild protein-calorie malnutrition ([place])  He is going to see Dr. Manfred Shirts may want to go down on Ozempic  4. Moderate episode of recurrent major depressive disorder (HCC)  Stable on medication, stress at home, he states medication works well for him  5. Inflammatory spondylopathy of lumbosacral region [facility])  Under the care of Dr. [name]  6. Lyme arthritis of multiple joints (HCC)  stable  7. HFrEF (heart failure with reduced ejection fraction) ([place])  Keep follow up with cardiologist   8. Atherosclerosis of aorta (HCC)  Continue statin therapy   9. Coronary artery disease of native artery of native heart with stable angina pectoris ([place])   10. Bronchitis  Mild at this point, discussed prednisone but he would like to hold off for now   11. Asthma, moderate persistent, poorly-controlled  - Tiotropium Bromide Monohydrate (SPIRIVA RESPIMAT) 1.25 MCG/ACT AERS; Inhale 2 puffs into the lungs daily.  Dispense: 4 g; Refill: 0

## 2022-10-20 ENCOUNTER — Encounter: Payer: Self-pay | Admitting: Family Medicine

## 2022-10-20 ENCOUNTER — Ambulatory Visit: Payer: Commercial Managed Care - HMO | Admitting: Family Medicine

## 2022-10-20 VITALS — BP 120/72 | HR 82 | Resp 16 | Ht 67.0 in | Wt 189.0 lb

## 2022-10-20 DIAGNOSIS — F331 Major depressive disorder, recurrent, moderate: Secondary | ICD-10-CM

## 2022-10-20 DIAGNOSIS — M4697 Unspecified inflammatory spondylopathy, lumbosacral region: Secondary | ICD-10-CM

## 2022-10-20 DIAGNOSIS — J40 Bronchitis, not specified as acute or chronic: Secondary | ICD-10-CM

## 2022-10-20 DIAGNOSIS — G7000 Myasthenia gravis without (acute) exacerbation: Secondary | ICD-10-CM

## 2022-10-20 DIAGNOSIS — A6923 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: Secondary | ICD-10-CM

## 2022-10-20 DIAGNOSIS — E441 Mild protein-calorie malnutrition: Secondary | ICD-10-CM

## 2022-10-20 DIAGNOSIS — E1142 Type 2 diabetes mellitus with diabetic polyneuropathy: Secondary | ICD-10-CM

## 2022-10-20 DIAGNOSIS — J4540 Moderate persistent asthma, uncomplicated: Secondary | ICD-10-CM

## 2022-10-20 DIAGNOSIS — I25118 Atherosclerotic heart disease of native coronary artery with other forms of angina pectoris: Secondary | ICD-10-CM

## 2022-10-20 DIAGNOSIS — Z794 Long term (current) use of insulin: Secondary | ICD-10-CM

## 2022-10-20 DIAGNOSIS — I700 Atherosclerosis of aorta: Secondary | ICD-10-CM

## 2022-10-20 DIAGNOSIS — I5020 Unspecified systolic (congestive) heart failure: Secondary | ICD-10-CM

## 2022-10-20 MED ORDER — SPIRIVA RESPIMAT 1.25 MCG/ACT IN AERS: 2.0000 | INHALATION_SPRAY | Freq: Every day | RESPIRATORY_TRACT | 0 refills | Status: DC

## 2022-10-21 LAB — COMPREHENSIVE METABOLIC PANEL
Calcium: 9.6 (ref 8.7–10.7)
eGFR: 68

## 2022-10-21 LAB — BASIC METABOLIC PANEL
BUN: 19 (ref 4–21)
CO2: 31 — AB (ref 13–22)
Chloride: 103 (ref 99–108)
Creatinine: 1.2 (ref 0.6–1.3)
Glucose: 89
Potassium: 5 mEq/L (ref 3.5–5.1)
Sodium: 142 (ref 137–147)

## 2022-10-21 LAB — HEMOGLOBIN A1C: Hemoglobin A1C: 7.5

## 2022-10-22 ENCOUNTER — Other Ambulatory Visit: Payer: Self-pay | Admitting: Cardiovascular Disease

## 2022-10-29 ENCOUNTER — Encounter: Payer: Self-pay | Admitting: Oncology

## 2022-11-02 ENCOUNTER — Ambulatory Visit: Payer: Medicare Other | Admitting: Gastroenterology

## 2022-11-04 ENCOUNTER — Other Ambulatory Visit: Payer: Self-pay | Admitting: Cardiovascular Disease

## 2022-11-04 DIAGNOSIS — I5022 Chronic systolic (congestive) heart failure: Secondary | ICD-10-CM

## 2022-11-05 ENCOUNTER — Ambulatory Visit: Payer: Commercial Managed Care - HMO | Admitting: Podiatry

## 2022-11-17 ENCOUNTER — Other Ambulatory Visit: Payer: Self-pay | Admitting: Family Medicine

## 2022-11-17 DIAGNOSIS — J4540 Moderate persistent asthma, uncomplicated: Secondary | ICD-10-CM

## 2022-11-18 ENCOUNTER — Telehealth: Payer: Self-pay | Admitting: Cardiovascular Disease

## 2022-11-18 NOTE — Telephone Encounter (Signed)
Returned the call to the patient. He stated that over the last week, he has gained a total of 10 pounds. Current weight is 190 pounds. He stated that he does not have noticeable swelling but has had an increase in shortness of breath when ambulating and when lying down at night. He has been taking his PRN Furosemide 20 mg once daily for the past week with no changes.   He has also had an increase in intermittent chest pain. He stated that normally he would have it 3-4 times a month but now he has it 3-4 times a week. The pain is relieved with one nitroglycerin.  Blood pressure and heart rate this morning was 114/80 and 90.   Patient denied current chest pain and was not audibly short of breath while on the phone. Earliest appointment available was for Friday 2/16.   Patient made aware of ED precautions should new or worsening symptoms occur. Patient verbalized understanding.

## 2022-11-18 NOTE — Telephone Encounter (Signed)
Pt c/o swelling: STAT is pt has developed SOB within 24 hours  If swelling, where is the swelling located? no  How much weight have you gained and in what time span? 10 lbs in 8 days  Have you gained 3 pounds in a day or 5 pounds in a week? yes  Do you have a log of your daily weights (if so, list)? 8 days ago 180 lbs, day after 182 lbs, 185 lbs, 183 lbs, 186 lbs, 188 lbs, 188 lbs, 190 lbs this morning  Are you currently taking a fluid pill? Yes, furosemide  Are you currently SOB? Not currently SOB on the phone, but does get SOB especially when laying down  Have you traveled recently? no  Pt c/o of Chest Pain: STAT if CP now or developed within 24 hours  1. Are you having CP right now? no  2. Are you experiencing any other symptoms (ex. SOB, nausea, vomiting, sweating)? Get SOB when laying down, sweats a lot at night   3. How long have you been experiencing CP? Increased in the last 2 weeks  4. Is your CP continuous or coming and going? Comes and goes, last episode was this morning  5. Have you taken Nitroglycerin? yes ?   Patient states he has gained 10 lbs in 8 days and has had increased angina.

## 2022-11-18 NOTE — Telephone Encounter (Signed)
I agree that he needs an office visit evaluation.

## 2022-11-19 ENCOUNTER — Encounter: Payer: Self-pay | Admitting: Oncology

## 2022-11-19 NOTE — Telephone Encounter (Signed)
Returned the to the patient. He will keep his appointment for tomorrow. He stated that he feels the same and has had no worsening symptoms. ED precautions have again been given.

## 2022-11-20 ENCOUNTER — Encounter: Payer: Self-pay | Admitting: Nurse Practitioner

## 2022-11-20 ENCOUNTER — Encounter: Payer: Self-pay | Admitting: Oncology

## 2022-11-20 ENCOUNTER — Ambulatory Visit: Payer: Medicare Other | Attending: Nurse Practitioner | Admitting: Nurse Practitioner

## 2022-11-20 VITALS — BP 106/62 | HR 97 | Ht 68.0 in | Wt 199.0 lb

## 2022-11-20 DIAGNOSIS — I5023 Acute on chronic systolic (congestive) heart failure: Secondary | ICD-10-CM

## 2022-11-20 DIAGNOSIS — I10 Essential (primary) hypertension: Secondary | ICD-10-CM

## 2022-11-20 DIAGNOSIS — E875 Hyperkalemia: Secondary | ICD-10-CM | POA: Diagnosis not present

## 2022-11-20 DIAGNOSIS — E1142 Type 2 diabetes mellitus with diabetic polyneuropathy: Secondary | ICD-10-CM

## 2022-11-20 DIAGNOSIS — I25118 Atherosclerotic heart disease of native coronary artery with other forms of angina pectoris: Secondary | ICD-10-CM

## 2022-11-20 DIAGNOSIS — I951 Orthostatic hypotension: Secondary | ICD-10-CM

## 2022-11-20 DIAGNOSIS — Z794 Long term (current) use of insulin: Secondary | ICD-10-CM

## 2022-11-20 DIAGNOSIS — E785 Hyperlipidemia, unspecified: Secondary | ICD-10-CM

## 2022-11-20 DIAGNOSIS — I255 Ischemic cardiomyopathy: Secondary | ICD-10-CM | POA: Diagnosis not present

## 2022-11-20 MED ORDER — FUROSEMIDE 40 MG PO TABS: 40.0000 mg | ORAL_TABLET | Freq: Every day | ORAL | 0 refills | Status: DC

## 2022-11-20 NOTE — Patient Instructions (Addendum)
Medication Instructions:  INCREASE lasix (furosemide) to 40 mg by mouth twice a day for three days then take once daily  *If you need a refill on your cardiac medications before your next appointment, please call your pharmacy*   Lab Work: BMP If you have labs (blood work) drawn today and your tests are completely normal, you will receive your results only by: [place] (if you have MyChart) OR A paper copy in the mail If you have any lab test that is abnormal or we need to change your treatment, we will call you to review the results.   Testing/Procedures: Your physician has requested that you have an echocardiogram. Echocardiography is a painless test that uses sound waves to create images of your heart. It provides your doctor with information about the size and shape of your heart and how well your heart's chambers and valves are working. This procedure takes approximately one hour. There are no restrictions for this procedure. Please do NOT wear cologne, perfume, aftershave, or lotions (deodorant is allowed). Please arrive 15 minutes prior to your appointment time.  Follow-Up: At [facility], you and your health needs are our priority.  As part of our continuing mission to provide you with exceptional heart care, we have created designated Provider Care Teams.  These Care Teams include your primary Cardiologist (physician) and Advanced Practice Providers (APPs -  Physician Assistants and Nurse Practitioners) who all work together to provide you with the care you need, when you need it.  We recommend signing up for the patient portal called "MyChart".  Sign up information is provided on this After Visit Summary.  MyChart is used to connect with patients for Virtual Visits (Telemedicine).  Patients are able to view lab/test results, encounter notes, upcoming appointments, etc.  Non-urgent messages can be sent to your provider as well.   To learn more about what you can do  with MyChart, go to [email or web address].    Your next appointment:   Keep as already scheduled on 12/03/22  Provider:   [name], MD

## 2022-11-20 NOTE — Progress Notes (Signed)
Office Visit    Patient Name: [name] Date of Encounter: 11/20/2022  Primary Care Provider:  [name], MD Primary Cardiologist:  [name], MD  Chief Complaint    62 y/o ? w/ a h/o CAD s/p multiple PCI's, ischemic cardiomyopathy, HFrEF, hypertension, orthostatic hypotension, hyperlipidemia, type 2 diabetes mellitus, diabetic neuropathy, myasthenia gravis, sepsis, and obstructive sleep apnea, who presents for CAD and heart failure follow-up.  Past Medical History    Past Medical History:  Diagnosis Date   Allergy    dust, seasonal (worse in the fall).   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 Followed by Pain Specialist in [place], back and neck   Asthma    BRONCHITIS   Cataract    First Dx in 2012   Chronic combined systolic and diastolic congestive heart failure ([place])    a. 03/2018 Echo: EF 30-35%; b. 07/2018 Echo: EF 35-40%; c. 09/2019 TEE: EF 40-45%; d. 12/2020 Echo: EF 35-40%; e.01/2022  Echo: EF 45-50%. sev apical HK. Nl RV fxn. Ao root 85m.   Coronary artery disease    a. Prior Ant MI->s/p mult stents->LAD/RCA ([place]); b. 2016 Cath: nonobs dzs;  c. 04/2018 Cath/PCI: LC[place] [id number] [place] d. 02/2022 MV: high risk; e. 02/2022 Cath: LM nl, LAD 20p ISR, patent mid-stent, LCX patent stent, OM1 nl, OM2 50p, patent stent, OM3 40p, patent stent, RCA 40p ISR, 2068mR, 57m61mpatent distal stent, RPDA patent stent, RPAV small, 80 (sl progression)-->Med Rx.   Deaf, left    Diabetes mellitus without complication (HCC)[place] TYPE 2   Diabetic peripheral neuropathy (HCC)[place] feet and hands   FUO (fever of unknown origin) 08/03/2018   GERD (gastroesophageal reflux disease)    Headache    muscle tension   Hyperlipidemia    Hypertension    Hyperthyroidism    Insomnia    Ischemic cardiomyopathy    a. 03/2018 Echo: EF 30-35%; b. 07/2018 Echo: EF 35-40%, Gr1 DD; c. 09/2019 TEE: EF 40-45%; d. 12/2020 Echo: EF 35-40%. GrI DD; e. 01/2022 Echo: EF 45-50%.   Knee pain, acute  05/06/2020   Left arm weakness 10/04/2019   Left leg weakness 12/01/2019   Lyme disease    Chronic   Myasthenia gravis (HCC)[place] (03/18/21 - no current treatment - "better than it has ever been" per pt)   Myocardial infarction (HCC)[id number]   Palpitations    a. 10/2020 [place]: RSR, 88 avg. 3 brief SVT episodes (max 5 beats @ 128). Rare PACs/PVCs. Triggered events did not correlate w/ significant arrthymia - some w/ sinus tach.   Seasonal allergies    Sepsis (HCC)[place] a.07/2018 - unknown source. TEE neg for veg 09/2019.   Sleep apnea    CPAP   Wears hearing aid in both ears    Past Surgical History:  Procedure Laterality Date   BILATERAL CARPAL TUNNEL RELEASE Bilateral L in 2012 and R in 2013   CARDMayodanst recent in  March 2016.   COLONOSCOPY WITH PROPOFOL N/A 01/10/2016   Procedure: COLONOSCOPY WITH PROPOFOL;  Surgeon: DarrLucilla Lame;  Location: [facility] ENDOSCOPY;  Service: Endoscopy;  Laterality: N/A;   COLONOSCOPY WITH PROPOFOL N/A 04/14/2021   Procedure: COLONOSCOPY WITH PROPOFOL;  Surgeon: WohlLucilla Lame;  [facility] [place] [place];  Laterality: N/A;  Diabetic - insulin and oral meds   CORONARY  ANGIOPLASTY     CORONARY STENT INTERVENTION N/A 04/25/2018   Procedure: CORONARY STENT INTERVENTION;  Surgeon: [name], MD;  Location: [place] [facility];  Service: Cardiovascular;  Laterality: N/A;   ESOPHAGOGASTRODUODENOSCOPY (EGD) WITH PROPOFOL N/A 01/10/2016   Procedure: ESOPHAGOGASTRODUODENOSCOPY (EGD) WITH PROPOFOL;  Surgeon: [name], MD;  Location: [facility] ENDOSCOPY;  Service: Endoscopy;  Laterality: N/A;   ESOPHAGOGASTRODUODENOSCOPY (EGD) WITH PROPOFOL N/A 04/14/2021   Procedure: ESOPHAGOGASTRODUODENOSCOPY (EGD) WITH BIOPSY;  Surgeon: [name], MD;  Location: [place];  Service: Endoscopy;  Laterality: N/A;   EYE SURGERY Bilateral 2012   cataract/bilateral vitrectomies   GIVENS CAPSULE STUDY N/A 08/20/2021    Procedure: GIVENS CAPSULE STUDY;  Surgeon: [name], MD;  Location: [facility] ENDOSCOPY;  Service: Endoscopy;  Laterality: N/A;   LEFT HEART CATH AND CORONARY ANGIOGRAPHY Left 04/25/2018   Procedure: LEFT HEART CATH AND CORONARY ANGIOGRAPHY;  Surgeon: [name], MD;  Location: [place] [facility];  Service: Cardiovascular;  Laterality: Left;   LEFT HEART CATH AND CORONARY ANGIOGRAPHY Left 02/02/2022   Procedure: LEFT HEART CATH AND CORONARY ANGIOGRAPHY;  Surgeon: [name], MD;  Location: [place] [facility];  Service: Cardiovascular;  Laterality: Left;   TEE WITHOUT CARDIOVERSION N/A 09/05/2018   Procedure: TRANSESOPHAGEAL ECHOCARDIOGRAM (TEE);  Surgeon: [name], MD;  Location: [facility]S;  Service: Cardiovascular;  Laterality: N/A;   TONSILLECTOMY AND ADENOIDECTOMY     As a child   TUNNELED VENOUS CATHETER PLACEMENT     removed    Allergies  Allergies  Allergen Reactions   Azathioprine Other (See Comments)    Azathioprine hypersensitivity reaction - Symptoms mimicking sepsis - was hospitalized   Novolog [Insulin Aspart] Hives    History of Present Illness    62 year old male with above complex past medical history including CAD status post multiple PCI's, ischemic cardiomyopathy, HFrEF, hypertension, hyperlipidemia, type 2 diabetes mellitus, diabetic neuropathy, myasthenia gravis, sepsis, and obstructive sleep apnea.  He initially suffered a myocardial infarction in 2009 while living in [place].  He has had approximately 10 stents between the LAD artery over time.  In June 2019, he had worsening dyspnea on exertion and chest pain.  Echo showed an EF of 30 to 35%.  Cath showed a new 90% stenosis in the mid left circumflex with multiple patent LAD, RCA, and OM2 stents.  The circumflex was successfully treated with a drug-eluting stent.  In the fall 2019, he was admitted with sepsis with subsequent TEE showing an EF of 40 to 45% without vegetation.  He had a syncopal  episode in October 2021 in the setting of coughing with subsequent monitoring in January 2022 showing no significant arrhythmias.  Echo in March 2022 showed EF of 35 to 40% with severe apical anterior, apical lateral, apical inferior, and apical hypokinesis.  Grade 1 diastolic dysfunction and normal RV function noted.  In the setting of significant orthostasis, he has undergone significant scaling back of his medications.  He did not tolerate Entresto and beta-blocker therapy ended up being discontinued due to ongoing severe orthostasis and presyncope despite downward titration.  In April 2023, he presented with chest pain and underwent stress testing which showed partially reversible defect in the apical to mid inferior, inferolateral, and apical to mid anterior locations.  Echo showed improved EF @ 45-50%.  He subsequently underwent diagnostic catheterization in May 2023 which showed predominantly moderate, nonobstructive disease with minimal in-stent restenosis.  He had some progression of the small vessel RPAV (80%  stenosis), medical therapy was recommended.  Mr. Schuppe was last seen in cardiology clinic in November 2023, which time he reported occasional chest pain with agitation or exertion, which was overall stable.  Ranexa was discussed however patient preferred to avoid at that time.  Mr. Anger contacted our office on 2/14 with a 1 week history of 10 pound weight gain, dyspnea on exertion, and orthopnea.  He has been taking Lasix 20 mg daily as needed without significant change in symptoms.  He also noted an increase in intermittent nitrate responsive chest pain.  He typically walks his dog for about 1 city block every morning and over the past few mornings, he has noted chest discomfort for which he will stop and sometimes take nitroglycerin.  He has not had any significant lower extremity edema or increase in abdominal girth.  He denies palpitations, PND, dizziness, syncope, or early satiety.  He has  not had any resting angina.  Home Medications    Current Outpatient Medications  Medication Sig Dispense Refill   albuterol (PROVENTIL) (2.5 MG/3ML) 0.083% nebulizer solution Take 3 mLs (2.5 mg total) by nebulization every 6 (six) hours as needed for wheezing or shortness of breath. 75 mL 2   albuterol (VENTOLIN HFA) 108 (90 Base) MCG/ACT inhaler Inhale 2 puffs into the lungs daily.     aspirin EC 81 MG tablet Take 1 tablet (81 mg total) by mouth daily. 90 tablet 3   clopidogrel (PLAVIX) 75 MG tablet Take 1 tablet (75 mg) by mouth daily with breakfast 30 tablet 6   cyclobenzaprine (FLEXERIL) 10 MG tablet TAKE 1 TABLET(10 MG) BY MOUTH TWICE DAILY 60 tablet 5   dexlansoprazole (DEXILANT) 60 MG capsule TAKE 1 CAPSULE(60 MG) BY MOUTH DAILY 30 capsule 0   DULoxetine (CYMBALTA) 30 MG capsule Take 1 capsule (30 mg total) by mouth daily. 90 capsule 1   DULoxetine (CYMBALTA) 60 MG capsule TAKE 1 CAPSULE(60 MG) BY MOUTH DAILY 90 capsule 1   ezetimibe (ZETIA) 10 MG tablet TAKE 1 TABLET(10 MG) BY MOUTH DAILY 90 tablet 0   furosemide (LASIX) 40 MG tablet Take 1 tablet (40 mg total) by mouth daily. 90 tablet 0   gabapentin (NEURONTIN) 300 MG capsule Take 1 capsule (300 mg total) by mouth 3 (three) times daily. 90 capsule 1   gabapentin (NEURONTIN) 800 MG tablet Take 800 mg by mouth at bedtime.     Humidifier/Vaporizer Supplies (VICKS VAPORIZER SCENT) PADS Apply 1 application. topically daily as needed (cough).     insulin lispro (HUMALOG) 100 UNIT/ML KiwkPen Inject 6-10 Units into the skin 3 (three) times daily.  sliding scale 1 unit for every 10 units of carbs     Insulin Pen Needle 32G X 4 MM MISC Inject 1 each into the skin as needed.     JARDIANCE 10 MG TABS tablet TAKE 1 TABLET(10 MG) BY MOUTH DAILY BEFORE BREAKFAST 30 tablet 0   LANTUS SOLOSTAR 100 UNIT/ML Solostar Pen Inject 20-23 Units into the skin daily at 10 pm. Sliding scale based on blood glucose  0   levocetirizine (XYZAL) 5 MG tablet TAKE 1  TABLET(5 MG) BY MOUTH EVERY EVENING 90 tablet 1   levothyroxine (SYNTHROID) 50 MCG tablet TAKE 1 TABLET BY MOUTH EVERY DAY MONDAY THROUGH SATURDAY AND 2 TABLETS EVERY SUNDAY 102 tablet 0   loratadine (CLARITIN) 10 MG tablet Take 10 mg by mouth in the morning.     metFORMIN (GLUCOPHAGE) 1000 MG tablet Take 1,000 mg by mouth 2 (two)  times daily with a meal.     montelukast (SINGULAIR) 10 MG tablet Take 10 mg by mouth at bedtime.     Multiple Vitamins-Minerals (MENS MULTI VITAMIN & MINERAL PO) Take 1 tablet by mouth in the morning and at bedtime.     nitroGLYCERIN (NITROSTAT) 0.3 MG SL tablet DISSOLVE 1 TABLET UNDER THE TONGUE EVERY 5 MINUTES AS NEEDED FOR CHEST PAIN. MAXIMUM 3 DOSES. 100 tablet 3   Oxycodone HCl 10 MG TABS Take 1 tablet (10 mg total) by mouth in the morning, at noon, in the evening, and at bedtime. 120 tablet 0   [START ON 11/25/2022] Oxycodone HCl 10 MG TABS Take 1 tablet (10 mg total) by mouth in the morning, at noon, in the evening, and at bedtime. 120 tablet 0   rosuvastatin (CRESTOR) 5 MG tablet Take 5 mg by mouth daily with supper.  0   Semaglutide, 1 MG/DOSE, 2 MG/1.5ML SOPN Inject 1 mg into the skin daily at 12 noon.     SPIRIVA RESPIMAT 1.25 MCG/ACT AERS INHALE 2 PUFFS INTO THE LUNGS DAILY 4 g 0   ammonium lactate (AMLACTIN) 12 % lotion Apply 1 application topically as needed for dry skin. (Patient not taking: Reported on 11/20/2022) 400 g 3   fluticasone furoate-vilanterol (BREO ELLIPTA) 200-25 MCG/ACT AEPB Inhale 1 puff into the lungs daily. (Patient not taking: Reported on 11/20/2022)     naloxone [facility]) nasal spray 4 mg/0.1 mL For excess sedation from opioids (Patient not taking: Reported on 11/20/2022) 1 kit 2   Current Facility-Administered Medications  Medication Dose Route Frequency Provider Last Rate Last Admin   sodium chloride flush (NS) 0.9 % injection 3 mL  3 mL Intravenous Q12H Furth, Cadence H, PA-C         Review of Systems    1 week history of increasing  dyspnea on exertion, 10 pound weight gain, and orthopnea.  Has also had more frequent episodes of exertional chest discomfort which has been nitrate responsive.  He denies palpitations, PND, dizziness, syncope, edema, or early satiety.  All other systems reviewed and are otherwise negative except as noted above.    Physical Exam    VS:  BP 106/62 (BP Location: Left Arm, Patient Position: Sitting, Cuff Size: Normal)   Pulse 97   Ht 5' 8"$  (1.727 m)   Wt 199 lb (90.3 kg)   SpO2 95%   BMI 30.26 kg/m  , BMI Body mass index is 30.26 kg/m.     GEN: Well nourished, well developed, in no acute distress. HEENT: normal. Neck: Supple, moderately elevated JVP.  No bruits or masses.   Cardiac: RRR, no murmurs, rubs, or gallops. No clubbing, cyanosis, edema.  Radials 2+/PT 2+ and equal bilaterally.  Respiratory:  Respirations regular and unlabored, inspiratory and extremity wheezing throughout. GI: Soft, nontender, nondistended, BS + x 4. MS: no deformity or atrophy. Skin: warm and dry, no rash. Neuro:  Strength and sensation are intact. Psych: Normal affect.  Accessory Clinical Findings    ECG personally reviewed by me today -regular sinus rhythm, 97, right bundle branch block, inferior and anterolateral infarcts- no acute changes.  Lab Results  Component Value Date   WBC 5.3 06/05/2022   HGB 13.8 06/05/2022   HCT 42.5 06/05/2022   MCV 90.6 06/05/2022   PLT 155 06/05/2022   Lab Results  Component Value Date   CREATININE 1.2 06/24/2022   BUN 22 (A) 06/24/2022   NA 141 06/24/2022   K 4.5 06/24/2022  CL 103 06/24/2022   CO2 33 (A) 06/24/2022   Lab Results  Component Value Date   ALT 14 06/24/2022   AST 16 06/24/2022   ALKPHOS 66 06/24/2022   BILITOT 0.4 09/23/2021   Lab Results  Component Value Date   CHOL 106 06/24/2022   HDL 39 06/24/2022   LDLCALC 40 06/24/2022   LDLDIRECT 36 07/17/2021   TRIG 133 06/24/2022   CHOLHDL 3.3 07/17/2021    Lab Results  Component Value  Date   HGBA1C 7.7 06/24/2022    Assessment & Plan    1.  Acute on chronic heart failure with reduced ejection fraction/ischemic cardiomyopathy: Most recent echo in April 2023 showed slight improvement in EF to 45 to 50% with apical hypokinesis and normal RV function.  Over the past week, he has been experiencing progressive weight gain, currently now at 10 pounds, with worsening dyspnea on exertion and orthopnea.  He has been taking Lasix 20 mg daily without significant change in weight.  Examination notable for moderately elevated JVP and diffuse inspiratory and expiratory wheezing (patient notes that this is normal for him).  I do not appreciate crackles.  I have asked him to increase his Lasix to 40 mg twice daily for the next 3 days and then drop down to 40 mg daily on Monday.  Plan for follow-up basic metabolic panel next Wednesday.  If symptoms do not improve, he will notify us and we will see him back next week, otherwise we will keep his appointment for February 29 with Dr. Fletcher Anon.  Ordering a repeat 2D echocardiogram today.  GDMT chronically limited by relative hypotension (106/62 today).  He remains on Jardiance and semaglutide.  2.  Stable angina/coronary artery disease: Most recent diagnostic catheterization in May 2023 showed patent stents with minimal in-stent restenosis and some progression of small vessel disease involving the RPAV.  He is accustomed to having periodic episodes of chest pain, usually with high levels of activity or agitation/emotional stress however, over the past 10 days, in the setting of above, he has noted more frequent episodes of chest pain with exertion, for which she will occasionally take nitrates with immediate relief.  ECG unchanged today.  We discussed that he may require repeat diagnostic catheterization.  Plan for echo and diuresis as above.  Early follow-up to reevaluate symptoms.  Continue aspirin, Plavix, statin, and Zetia.  3.  Essential  hypertension/orthostatic hypotension: As above, this is limited GDMT historically.  He did not tolerate Entresto and he is no longer on beta-blocker therapy.  Pressure 106/62 today.  4.  Hyperlipidemia: LDL of 40 in September 2023.  He remains on statin and Zetia therapy.  5.  Type 2 diabetes mellitus: A1c 7.7 in September.  On multiple agents and followed by primary care.  6.  Disposition: Follow-up basic metabolic panel next Wednesday.  Follow-up echocardiogram.  Follow-up in clinic on February 29 or sooner if necessary.   Murray Hodgkins, NP 11/20/2022, 12:09 PM

## 2022-11-25 ENCOUNTER — Encounter: Payer: Self-pay | Admitting: Cardiovascular Disease

## 2022-11-25 ENCOUNTER — Other Ambulatory Visit
Admission: RE | Admit: 2022-11-25 | Discharge: 2022-11-25 | Disposition: A | Discharge status: Home / Self Care | Payer: Medicare Other | Source: Ambulatory Visit | Attending: Nurse Practitioner | Admitting: Nurse Practitioner

## 2022-11-25 DIAGNOSIS — E875 Hyperkalemia: Secondary | ICD-10-CM | POA: Diagnosis not present

## 2022-11-25 DIAGNOSIS — I5022 Chronic systolic (congestive) heart failure: Secondary | ICD-10-CM | POA: Insufficient documentation

## 2022-11-25 LAB — BASIC METABOLIC PANEL
Anion gap: 7 (ref 5–15)
BUN: 25 mg/dL — ABNORMAL HIGH (ref 8–23)
CO2: 29 mmol/L (ref 22–32)
Calcium: 8.9 mg/dL (ref 8.9–10.3)
Chloride: 101 mmol/L (ref 98–111)
Creatinine, Ser: 1.26 mg/dL — ABNORMAL HIGH (ref 0.61–1.24)
GFR, Estimated: >60 mL/min (ref >60)
Glucose, Bld: 61 mg/dL — ABNORMAL LOW (ref 70–99)
Potassium: 3.8 mmol/L (ref 3.5–5.1)
Sodium: 137 mmol/L (ref 135–145)

## 2022-11-26 ENCOUNTER — Encounter: Payer: Self-pay | Admitting: Oncology

## 2022-11-26 NOTE — Addendum Note (Signed)
Addended by: James Ivanoff D on: 11/26/2022 08:23 AM   Modules accepted: Orders

## 2022-11-30 ENCOUNTER — Encounter: Payer: Self-pay | Admitting: Family Medicine

## 2022-11-30 ENCOUNTER — Encounter: Payer: Self-pay | Admitting: Oncology

## 2022-11-30 ENCOUNTER — Ambulatory Visit (INDEPENDENT_AMBULATORY_CARE_PROVIDER_SITE_OTHER): Payer: Medicare Other | Admitting: Family Medicine

## 2022-11-30 VITALS — BP 116/68 | HR 86 | Temp 97.8°F | Resp 16 | Ht 67.0 in | Wt 192.0 lb

## 2022-11-30 DIAGNOSIS — G4733 Obstructive sleep apnea (adult) (pediatric): Secondary | ICD-10-CM

## 2022-11-30 DIAGNOSIS — J4540 Moderate persistent asthma, uncomplicated: Secondary | ICD-10-CM | POA: Diagnosis not present

## 2022-11-30 MED ORDER — DOXYCYCLINE HYCLATE 100 MG PO TABS: 100.0000 mg | ORAL_TABLET | Freq: Two times a day (BID) | ORAL | 0 refills | Status: AC

## 2022-11-30 MED ORDER — PREDNISONE 20 MG PO TABS: 40.0000 mg | ORAL_TABLET | Freq: Every day | ORAL | 0 refills | Status: AC

## 2022-11-30 NOTE — Patient Instructions (Signed)
It was great to see you!  Our plans for today:  - Take the steroids and antibiotics as prescribed.   Take care and seek immediate care sooner if you develop any concerns.   Dr. Ky Barban

## 2022-11-30 NOTE — Progress Notes (Signed)
    SUBJECTIVE:   CHIEF COMPLAINT / HPI:   UPPER RESPIRATORY TRACT INFECTION - symptom onset 3 weeks ago - h/o asthma, f/b Pulm. On spiriva. Albuterol only helping in the moment, using multiple times per day.  - compliant with CPAP  Fever: off and on 99 Cough: yes, productive Shortness of breath: yes Wheezing: yes Chest pain: yes Chest congestion: yes Nasal congestion: yes Eyes red/itching:yes Vomiting: no Sick contacts: yes Context: a little worse Relief with OTC cold/cough medications: yes  Treatments attempted: Mucinex, dayquil, alka seltzer plus  OBJECTIVE:   BP 116/68   Pulse 86   Temp 97.8 F (36.6 C) (Oral)   Resp 16   Ht 5' 7"$  (1.702 m)   Wt 192 lb (87.1 kg)   SpO2 100%   BMI 30.07 kg/m   Gen: well appearing, in NAD Card: RRR Lungs: inspiratory and expiratory wheezing throughout. Comfortable WOB on RA, appropriately saturated. Speaks in full sentences, no resp distress. Ext: WWP, no edema   ASSESSMENT/PLAN:   Asthma, moderate persistent, poorly-controlled With current exacerbation likely precipitated by viral URI. Outside of treatment and quarantine windows, will defer COVID/flu testing. No signs/symptoms to concern for CHF exacerbation, euvolemic today. Will provide steroids, doxycycline. Continue maintenance inhalers and albuterol prn. Return and emergency precautions discussed.      Myles Gip, DO

## 2022-11-30 NOTE — Assessment & Plan Note (Addendum)
With current exacerbation likely precipitated by viral URI. Outside of treatment and quarantine windows, will defer COVID/flu testing. No signs/symptoms to concern for CHF exacerbation, euvolemic today. Will provide steroids, doxycycline. Continue maintenance inhalers and albuterol prn. Return and emergency precautions discussed.

## 2022-12-01 ENCOUNTER — Ambulatory Visit: Payer: Medicare Other | Admitting: Anesthesiology

## 2022-12-02 ENCOUNTER — Other Ambulatory Visit: Payer: Self-pay | Admitting: Cardiovascular Disease

## 2022-12-02 ENCOUNTER — Ambulatory Visit: Payer: Commercial Managed Care - HMO | Admitting: Anesthesiology

## 2022-12-02 DIAGNOSIS — I5022 Chronic systolic (congestive) heart failure: Secondary | ICD-10-CM

## 2022-12-03 ENCOUNTER — Ambulatory Visit: Payer: Medicare Other | Attending: Cardiovascular Disease | Admitting: Cardiovascular Disease

## 2022-12-03 ENCOUNTER — Encounter: Payer: Self-pay | Admitting: Cardiovascular Disease

## 2022-12-03 VITALS — BP 110/70 | HR 101 | Ht 67.0 in | Wt 190.5 lb

## 2022-12-03 DIAGNOSIS — E785 Hyperlipidemia, unspecified: Secondary | ICD-10-CM | POA: Diagnosis not present

## 2022-12-03 DIAGNOSIS — I25118 Atherosclerotic heart disease of native coronary artery with other forms of angina pectoris: Secondary | ICD-10-CM | POA: Diagnosis not present

## 2022-12-03 DIAGNOSIS — I10 Essential (primary) hypertension: Secondary | ICD-10-CM

## 2022-12-03 DIAGNOSIS — I5022 Chronic systolic (congestive) heart failure: Secondary | ICD-10-CM

## 2022-12-03 MED ORDER — EMPAGLIFLOZIN 10 MG PO TABS: ORAL_TABLET | ORAL | 3 refills | Status: DC

## 2022-12-03 MED ORDER — METOPROLOL SUCCINATE ER 25 MG PO TB24: 25.0000 mg | ORAL_TABLET | Freq: Every day | ORAL | 1 refills | Status: DC

## 2022-12-03 NOTE — Patient Instructions (Signed)
Medication Instructions:  START Metoprolol (Toprol) 25 mg once daily  *If you need a refill on your cardiac medications before your next appointment, please call your pharmacy*   Lab Work: None ordered If you have labs (blood work) drawn today and your tests are completely normal, you will receive your results only by: [place] (if you have MyChart) OR A paper copy in the mail If you have any lab test that is abnormal or we need to change your treatment, we will call you to review the results.   Testing/Procedures: None ordered   Follow-Up: At [facility], you and your health needs are our priority.  As part of our continuing mission to provide you with exceptional heart care, we have created designated Provider Care Teams.  These Care Teams include your primary Cardiologist (physician) and Advanced Practice Providers (APPs -  Physician Assistants and Nurse Practitioners) who all work together to provide you with the care you need, when you need it.  We recommend signing up for the patient portal called "MyChart".  Sign up information is provided on this After Visit Summary.  MyChart is used to connect with patients for Virtual Visits (Telemedicine).  Patients are able to view lab/test results, encounter notes, upcoming appointments, etc.  Non-urgent messages can be sent to your provider as well.   To learn more about what you can do with MyChart, go to [email or web address].    Your next appointment:   3 month(s)  Provider:   You may see [name], MD or one of the following Advanced Practice Providers on your designated Care Team:   Murray Hodgkins, NP Christell Faith, PA-C Cadence Kathlen Mody, PA-C Gerrie Nordmann, NP

## 2022-12-03 NOTE — Telephone Encounter (Signed)
Patient has appt with Dr. Fletcher Anon at 2:20 today. Please refill if appropriate.Thanks Lenda Kelp!

## 2022-12-03 NOTE — Progress Notes (Signed)
Cardiology Office Note   Date:  12/03/2022   ID:  [name], DOB 22-Mar-1961, MRN [id number]  PCP:  [name], MD  Cardiologist: [name], MD   Chief Complaint  Patient presents with   Other    3 month f/u c/o congestion when laying. Meds reviewed verbally with pt.       History of Present Illness: [name] is a 62 y.o. male who presents for a follow-up visit regarding coronary artery disease and chronic systolic heart failure.    He has extensive cardiac history. He had a total of 10 stents placed (in LAD and RCA) starting in 2009 after he presented with myocardial infarction.  He has other chronic medical conditions that include diabetes, hypertension, hyperlipidemia,myasthenia gravis,  generalized pain due to reported Lyme disease and obesity. He has history of hyperlipidemia with intolerance to high-dose statins.  He had worsening heart failure in June of 2019.  Echocardiogram showed worsening LV systolic function with an EF of 30% with akinesis of the anterior, anterolateral and apical myocardium.  There was mild mitral regurgitation. Cardiac catheterization in July, 2019 showed patent stents in the LAD, OM's and right coronary artery.  There was a new mid left circumflex stenosis which was stented.  EF was 25 to 30%.  LVEDP was severely elevated.  The patient was started on furosemide.  Entresto was added. He had recurrent sepsis and fever of unknown origin starting in October 2019 with elevated troponin.  Repeat catheterization was not performed.  TEE was performed which showed no evidence of endocarditis.  EF was 40 to 45%.  He did not tolerate Entresto or ARB's due to symptomatic hypotension.  He was on metoprolol at some point but that was also discontinued due to low blood pressure.  He had increased angina in 2023 and underwent cardiac catheterization in May which showed moderate nonobstructive disease with patent stents.  There was progression of  right posterior AV groove stenosis to 80% supplying a small distribution.  I recommended medical therapy.  He was seen recently with increased shortness of breath, lower extremity edema and weight gain.  He also reported worsening angina.  The dose of furosemide was increased to 40 mg twice daily for 3 days and then 40 mg daily.  He was previously taking 20 mg daily.  He reports significant improvement in symptoms since then although his anginal did not resolve completely.  Past Medical History:  Diagnosis Date   Allergy    dust, seasonal (worse in the fall).   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 Followed by Pain Specialist in [place], back and neck   Asthma    BRONCHITIS   Cataract    First Dx in 2012   Chronic combined systolic and diastolic congestive heart failure ([place])    a. 03/2018 Echo: EF 30-35%; b. 07/2018 Echo: EF 35-40%; c. 09/2019 TEE: EF 40-45%; d. 12/2020 Echo: EF 35-40%; e.01/2022  Echo: EF 45-50%. sev apical HK. Nl RV fxn. Ao root 68m.   Coronary artery disease    a. Prior Ant MI->s/p mult stents->LAD/RCA ([place]); b. 2016 Cath: nonobs dzs;  c. 04/2018 Cath/PCI: LCX [id number] Si[facility]ainES); d. 02/2022 MV: high risk; e. 02/2022 Cath: LM nl, LAD 20p ISR, patent mid-stent, LCX patent stent, OM1 nl, OM2 50p, patent stent, OM3 40p, patent stent, RCA 40p ISR, 2091mR, 101m50mpatent distal stent, RPDA patent stent, RPAV small, 80 (sl progression)-->Med Rx.  Deaf, left    Diabetes mellitus without complication ([place])    TYPE 2   Diabetic peripheral neuropathy ([place])    feet and hands   FUO (fever of unknown origin) 08/03/2018   GERD (gastroesophageal reflux disease)    Headache    muscle tension   Hyperlipidemia    Hypertension    Hyperthyroidism    Insomnia    Ischemic cardiomyopathy    a. 03/2018 Echo: EF 30-35%; b. 07/2018 Echo: EF 35-40%, Gr1 DD; c. 09/2019 TEE: EF 40-45%; d. 12/2020 Echo: EF 35-40%. GrI DD; e. 01/2022 Echo: EF 45-50%.   Knee pain, acute 05/06/2020   Left arm  weakness 10/04/2019   Left leg weakness 12/01/2019   Lyme disease    Chronic   Myasthenia gravis ([place])    (03/18/21 - no current treatment - "better than it has ever been" per pt)   Myocardial infarction ([place]) 2010   Palpitations    a. 10/2020 Zio: RSR, 88 avg. 3 brief SVT episodes (max 5 beats @ 128). Rare PACs/PVCs. Triggered events did not correlate w/ significant arrthymia - some w/ sinus tach.   Seasonal allergies    Sepsis ([place])    a.07/2018 - unknown source. TEE neg for veg 09/2019.   Sleep apnea    CPAP   Wears hearing aid in both ears     Past Surgical History:  Procedure Laterality Date   BILATERAL CARPAL TUNNEL RELEASE Bilateral L in 2012 and R in 2013   [place], most recent in  March 2016.   COLONOSCOPY WITH PROPOFOL N/A 01/10/2016   Procedure: COLONOSCOPY WITH PROPOFOL;  Surgeon: [name], MD;  Location: [facility] END[facility];  Service: Endoscopy;  Laterality: N/A;   COLONOSCOPY WITH PROPOFOL N/A 04/14/2021   Procedure: COLONOSCOPY WITH PROPOFOL;  Surgeon: [name], MD;  Location: [facility];  Service: Endoscopy;  Laterality: N/A;  Diabetic - insulin and oral meds   CORONARY ANGIOPLASTY     CORONARY STENT INTERVENTION N/A 04/25/2018   Procedure: CORONARY STENT INTERVENTION;  Surgeon: [name], MD;  Location: [place] [facility];  Service: Cardiovascular;  Laterality: N/A;   ESOPHAGOGASTRODUODENOSCOPY (EGD) WITH PROPOFOL N/A 01/10/2016   Procedure: ESOPHAGOGASTRODUODENOSCOPY (EGD) WITH PROPOFOL;  Surgeon: [name], MD;  Location: [facility];  Service: Endoscopy;  Laterality: N/A;   ESOPHAGOGASTRODUODENOSCOPY (EGD) WITH PROPOFOL N/A 04/14/2021   Procedure: ESOPHAGOGASTRODUODENOSCOPY (EGD) WITH BIOPSY;  Surgeon: [name], MD;  Location: [place];  Service: Endoscopy;  Laterality: N/A;   EYE SURGERY Bilateral 2012   cataract/bilateral vitrectomies   GIVENS CAPSULE STUDY N/A 08/20/2021   Procedure: GIVENS  CAPSULE STUDY;  Surgeon: [name], MD;  Location: [facility] ENDOSCOPY;  Service: Endoscopy;  Laterality: N/A;   LEFT HEART CATH AND CORONARY ANGIOGRAPHY Left 04/25/2018   Procedure: LEFT HEART CATH AND CORONARY ANGIOGRAPHY;  Surgeon: [name], MD;  Location: [place] [facility];  Service: Cardiovascular;  Laterality: Left;   LEFT HEART CATH AND CORONARY ANGIOGRAPHY Left 02/02/2022   Procedure: LEFT HEART CATH AND CORONARY ANGIOGRAPHY;  Surgeon: [name], MD;  Location: [place] [facility];  Service: Cardiovascular;  Laterality: Left;   TEE WITHOUT CARDIOVERSION N/A 09/05/2018   Procedure: TRANSESOPHAGEAL ECHOCARDIOGRAM (TEE);  Surgeon: [name], MD;  Location: [facility];  Service: Cardiovascular;  Laterality: N/A;   TONSILLECTOMY AND ADENOIDECTOMY     As a child   TUNNELED VENOUS CATHETER PLACEMENT     removed  Current Outpatient Medications  Medication Sig Dispense Refill   albuterol (PROVENTIL) (2.5 MG/3ML) 0.083% nebulizer solution Take 3 mLs (2.5 mg total) by nebulization every 6 (six) hours as needed for wheezing or shortness of breath. 75 mL 2   albuterol (VENTOLIN HFA) 108 (90 Base) MCG/ACT inhaler Inhale 2 puffs into the lungs daily.     ammonium lactate (AMLACTIN) 12 % lotion Apply 1 application topically as needed for dry skin. 400 g 3   aspirin EC 81 MG tablet Take 1 tablet (81 mg total) by mouth daily. 90 tablet 3   clopidogrel (PLAVIX) 75 MG tablet Take 1 tablet (75 mg) by mouth daily with breakfast 30 tablet 6   cyclobenzaprine (FLEXERIL) 10 MG tablet TAKE 1 TABLET(10 MG) BY MOUTH TWICE DAILY 60 tablet 5   dexlansoprazole (DEXILANT) 60 MG capsule TAKE 1 CAPSULE(60 MG) BY MOUTH DAILY 30 capsule 0   doxycycline (VIBRA-TABS) 100 MG tablet Take 1 tablet (100 mg total) by mouth 2 (two) times daily for 5 days. 10 tablet 0   DULoxetine (CYMBALTA) 30 MG capsule Take 1 capsule (30 mg total) by mouth daily. 90 capsule 1   DULoxetine (CYMBALTA) 60 MG capsule TAKE  1 CAPSULE(60 MG) BY MOUTH DAILY 90 capsule 1   ezetimibe (ZETIA) 10 MG tablet TAKE 1 TABLET(10 MG) BY MOUTH DAILY 90 tablet 0   fluticasone furoate-vilanterol (BREO ELLIPTA) 200-25 MCG/ACT AEPB Inhale 1 puff into the lungs daily.     furosemide (LASIX) 40 MG tablet Take 1 tablet (40 mg total) by mouth daily. 90 tablet 0   gabapentin (NEURONTIN) 300 MG capsule Take 1 capsule (300 mg total) by mouth 3 (three) times daily. 90 capsule 1   gabapentin (NEURONTIN) 800 MG tablet Take 800 mg by mouth at bedtime.     Humidifier/Vaporizer Supplies (VICKS VAPORIZER SCENT) PADS Apply 1 application. topically daily as needed (cough).     insulin lispro (HUMALOG) 100 UNIT/ML KiwkPen Inject 6-10 Units into the skin 3 (three) times daily.  sliding scale 1 unit for every 10 units of carbs     Insulin Pen Needle 32G X 4 MM MISC Inject 1 each into the skin as needed.     JARDIANCE 10 MG TABS tablet TAKE 1 TABLET(10 MG) BY MOUTH DAILY BEFORE BREAKFAST 30 tablet 0   LANTUS SOLOSTAR 100 UNIT/ML Solostar Pen Inject 20-23 Units into the skin daily at 10 pm. Sliding scale based on blood glucose  0   levocetirizine (XYZAL) 5 MG tablet TAKE 1 TABLET(5 MG) BY MOUTH EVERY EVENING 90 tablet 1   levothyroxine (SYNTHROID) 50 MCG tablet TAKE 1 TABLET BY MOUTH EVERY DAY MONDAY THROUGH SATURDAY AND 2 TABLETS EVERY SUNDAY 102 tablet 0   loratadine (CLARITIN) 10 MG tablet Take 10 mg by mouth in the morning.     metFORMIN (GLUCOPHAGE) 1000 MG tablet Take 1,000 mg by mouth 2 (two) times daily with a meal.     montelukast (SINGULAIR) 10 MG tablet Take 10 mg by mouth at bedtime.     Multiple Vitamins-Minerals (MENS MULTI VITAMIN & MINERAL PO) Take 1 tablet by mouth in the morning and at bedtime.     naloxone (NARCAN) nasal spray 4 mg/0.1 mL For excess sedation from opioids 1 kit 2   nitroGLYCERIN (NITROSTAT) 0.3 MG SL tablet DISSOLVE 1 TABLET UNDER THE TONGUE EVERY 5 MINUTES AS NEEDED FOR CHEST PAIN. MAXIMUM 3 DOSES. 100 tablet 3    Oxycodone HCl 10 MG TABS Take 1 tablet (10 mg total)  by mouth in the morning, at noon, in the evening, and at bedtime. 120 tablet 0   predniSONE (DELTASONE) 20 MG tablet Take 2 tablets (40 mg total) by mouth daily with breakfast for 5 days. 10 tablet 0   rosuvastatin (CRESTOR) 5 MG tablet Take 5 mg by mouth daily with supper.  0   Semaglutide, 1 MG/DOSE, 2 MG/1.5ML SOPN Inject 1 mg into the skin daily at 12 noon.     SPIRIVA RESPIMAT 1.25 MCG/ACT AERS INHALE 2 PUFFS INTO THE LUNGS DAILY 4 g 0   Current Facility-Administered Medications  Medication Dose Route Frequency Provider Last Rate Last Admin   sodium chloride flush (NS) 0.9 % injection 3 mL  3 mL Intravenous Q12H Furth, Cadence H, PA-C        Allergies:   Azathioprine and Novolog [insulin aspart]    Social History:  The patient  reports that he has never smoked. He has never used smokeless tobacco. He reports that he does not currently use alcohol. He reports that he does not use drugs.   Family History:  The patient's family history includes Cancer in his father; Dementia in his father; Diabetes in his brother and mother; Healthy in his brother and brother; Heart disease in his mother.    ROS:  Please see the history of present illness.   Otherwise, review of systems are positive for none.   All other systems are reviewed and negative.    PHYSICAL EXAM: VS:  BP 110/70 (BP Location: Left Arm, Patient Position: Sitting, Cuff Size: Normal)   Pulse (!) 101   Ht '5\' 7"'$  (1.702 m)   Wt 190 lb 8 oz (86.4 kg)   SpO2 98%   BMI 29.84 kg/m  , BMI Body mass index is 29.84 kg/m. GEN: Well nourished, well developed, in no acute distress  HEENT: normal  Neck: no JVD, carotid bruits, or masses  Cardiac: RRR; no murmurs, rubs, or gallops, trace bilateral leg edema Respiratory:  clear to auscultation bilaterally, normal work of breathing GI: soft, nontender, nondistended, + BS MS: no deformity or atrophy  Skin: warm and dry, no rash Neuro:   Strength and sensation are intact Psych: euthymic mood, full affect Distal pulses are palpable.  EKG:  EKG is ordered today. The ekg ordered today demonstrates sinus tachycardia with right bundle branch block, poor R wave progression in the precordial leads and possible old inferior infarct.     Recent Labs: 01/09/2022: BNP 60.6 06/05/2022: Hemoglobin 13.8; Platelets 155 06/24/2022: ALT 14; TSH 0.49 11/25/2022: BUN 25; Creatinine, Ser 1.26; Potassium 3.8; Sodium 137    Lipid Panel    Component Value Date/Time   CHOL 106 06/24/2022 0000   CHOL 92 (L) 07/17/2021 1117   TRIG 133 06/24/2022 0000   HDL 39 06/24/2022 0000   HDL 28 (L) 07/17/2021 1117   CHOLHDL 3.3 07/17/2021 1117   CHOLHDL 2.8 10/16/2019 1424   VLDL 26 11/29/2017 1154   LDLCALC 40 06/24/2022 0000   LDLCALC 39 07/17/2021 1117   LDLCALC 34 10/16/2019 1424   LDLDIRECT 36 07/17/2021 1117      Wt Readings from Last 3 Encounters:  12/03/22 190 lb 8 oz (86.4 kg)  11/30/22 192 lb (87.1 kg)  11/20/22 199 lb (90.3 kg)        ASSESSMENT AND PLAN:  1. Coronary artery disease with stable angina: He reports improvement in anginal symptoms since his volume overload improved.  He is mildly tachycardic.  I elected to start him  back on small dose Toprol 25 mg once daily to see if he can tolerate this to help with his angina.  In the past, he had episodes of symptomatic hypotension.  2.  Chronic systolic heart failure due to ischemic cardiomyopathy: Most recent ejection fraction was 45-50 %.  He had recent volume overload that improved significantly with increasing the dose of furosemide and he feels better.  GDMT has been limited by symptomatic hypotension.  I restarted small dose Toprol today.   3.  Essential hypertension: Blood pressure is controlled on current medications.   4.  Hyperlipidemia: Tolerating rosuvastatin and Zetia.  Most recently profile showed an LDL of 40.     Disposition:   FU with me in 3 months     Signed, [name], MD 12/03/22 [place], [place]

## 2022-12-07 ENCOUNTER — Other Ambulatory Visit: Payer: Self-pay | Admitting: Nurse Practitioner

## 2022-12-10 ENCOUNTER — Ambulatory Visit: Payer: Self-pay | Admitting: Podiatry

## 2022-12-14 ENCOUNTER — Ambulatory Visit (HOSPITAL_BASED_OUTPATIENT_CLINIC_OR_DEPARTMENT_OTHER): Payer: Medicare Other | Admitting: Anesthesiology

## 2022-12-14 ENCOUNTER — Ambulatory Visit
Admission: RE | Admit: 2022-12-14 | Discharge: 2022-12-14 | Disposition: A | Discharge status: Home / Self Care | Payer: Medicare Other | Source: Ambulatory Visit | Attending: Anesthesiology | Admitting: Anesthesiology

## 2022-12-14 ENCOUNTER — Encounter: Payer: Self-pay | Admitting: Anesthesiology

## 2022-12-14 ENCOUNTER — Other Ambulatory Visit: Payer: Self-pay | Admitting: Anesthesiology

## 2022-12-14 VITALS — BP 109/67 | HR 92 | Temp 97.2°F | Resp 16 | Ht 60.0 in | Wt 189.0 lb

## 2022-12-14 DIAGNOSIS — A6923 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: Secondary | ICD-10-CM | POA: Diagnosis present

## 2022-12-14 DIAGNOSIS — M4716 Other spondylosis with myelopathy, lumbar region: Secondary | ICD-10-CM

## 2022-12-14 DIAGNOSIS — M5136 Other intervertebral disc degeneration, lumbar region: Secondary | ICD-10-CM | POA: Diagnosis present

## 2022-12-14 DIAGNOSIS — G7000 Myasthenia gravis without (acute) exacerbation: Secondary | ICD-10-CM | POA: Insufficient documentation

## 2022-12-14 DIAGNOSIS — F1190 Opioid use, unspecified, uncomplicated: Secondary | ICD-10-CM

## 2022-12-14 DIAGNOSIS — M5432 Sciatica, left side: Secondary | ICD-10-CM | POA: Diagnosis present

## 2022-12-14 DIAGNOSIS — M545 Low back pain, unspecified: Secondary | ICD-10-CM

## 2022-12-14 DIAGNOSIS — M5442 Lumbago with sciatica, left side: Secondary | ICD-10-CM | POA: Diagnosis not present

## 2022-12-14 DIAGNOSIS — M47817 Spondylosis without myelopathy or radiculopathy, lumbosacral region: Secondary | ICD-10-CM | POA: Diagnosis present

## 2022-12-14 DIAGNOSIS — R29898 Other symptoms and signs involving the musculoskeletal system: Secondary | ICD-10-CM

## 2022-12-14 DIAGNOSIS — G894 Chronic pain syndrome: Secondary | ICD-10-CM

## 2022-12-14 DIAGNOSIS — M542 Cervicalgia: Secondary | ICD-10-CM

## 2022-12-14 MED ORDER — OXYCODONE HCL 10 MG PO TABS: 10.0000 mg | ORAL_TABLET | Freq: Four times a day (QID) | ORAL | 0 refills | Status: DC

## 2022-12-14 MED ORDER — TRIAMCINOLONE ACETONIDE 40 MG/ML IJ SUSP
40.0000 mg | Freq: Once | INTRAMUSCULAR | Status: AC
  Administered 2022-12-14: 40 mg
  Filled 2022-12-14: qty 1

## 2022-12-14 MED ORDER — IOHEXOL 180 MG/ML  SOLN
10.0000 mL | Freq: Once | INTRAMUSCULAR | Status: AC | PRN
  Administered 2022-12-14: 10 mL via EPIDURAL
  Filled 2022-12-14: qty 20

## 2022-12-14 MED ORDER — LIDOCAINE HCL (PF) 1 % IJ SOLN
5.0000 mL | Freq: Once | INTRAMUSCULAR | Status: AC
  Administered 2022-12-14: 5 mL via SUBCUTANEOUS
  Filled 2022-12-14: qty 5

## 2022-12-14 MED ORDER — ROPIVACAINE HCL 2 MG/ML IJ SOLN
10.0000 mL | Freq: Once | INTRAMUSCULAR | Status: AC
  Administered 2022-12-14: 1 mL via EPIDURAL
  Filled 2022-12-14: qty 20

## 2022-12-14 MED ORDER — SODIUM CHLORIDE 0.9% FLUSH
10.0000 mL | Freq: Once | INTRAVENOUS | Status: AC
  Administered 2022-12-14: 10 mL

## 2022-12-14 MED ORDER — OXYCODONE HCL 10 MG PO TABS: 10.0000 mg | ORAL_TABLET | Freq: Four times a day (QID) | ORAL | 0 refills | Status: AC

## 2022-12-14 NOTE — Progress Notes (Unsigned)
Subjective:  Patient ID: [name], male    DOB: [birth year]  Age: 62 y.o. MRN: [id number]  CC: Back Pain (lower)   Procedure: L5-S1 epidural steroid under fluoroscopic guidance with no sedation  [name] presents for reevaluation.  [name] continues to have sciatica symptoms over this past 4 to 6 weeks comparable to what he has generally been dealing with over the last 5 to 7 years.  He has epidural injections about every 3 to 4 months which keep his pain under good control.  His last injection gave him 75% reduction in his low back pain for 8 weeks before he had gradual recurrence and nearly 100% relief from his left lower extremity sciatica symptoms.  At this point he presents with recurrence of the same.  No change in lower extremity strength function or bowel or bladder function.  He still taking his medications as prescribed and these continue to give him good relief from his low back pain hip pain lower extremity pain and enable him to continue to be as active as reasonable sleep better.  No side effects are reported.  Outpatient Medications Prior to Visit  Medication Sig Dispense Refill   albuterol (PROVENTIL) (2.5 MG/3ML) 0.083% nebulizer solution Take 3 mLs (2.5 mg total) by nebulization every 6 (six) hours as needed for wheezing or shortness of breath. 75 mL 2   albuterol (VENTOLIN HFA) 108 (90 Base) MCG/ACT inhaler Inhale 2 puffs into the lungs daily.     ammonium lactate (AMLACTIN) 12 % lotion Apply 1 application topically as needed for dry skin. 400 g 3   aspirin EC 81 MG tablet Take 1 tablet (81 mg total) by mouth daily. 90 tablet 3   clopidogrel (PLAVIX) 75 MG tablet Take 1 tablet (75 mg) by mouth daily with breakfast 30 tablet 6   cyclobenzaprine (FLEXERIL) 10 MG tablet TAKE 1 TABLET(10 MG) BY MOUTH TWICE DAILY 60 tablet 5   dexlansoprazole (DEXILANT) 60 MG capsule TAKE 1 CAPSULE(60 MG) BY MOUTH DAILY 30 capsule 0   DULoxetine (CYMBALTA) 30 MG capsule Take 1  capsule (30 mg total) by mouth daily. 90 capsule 1   DULoxetine (CYMBALTA) 60 MG capsule TAKE 1 CAPSULE(60 MG) BY MOUTH DAILY 90 capsule 1   empagliflozin (JARDIANCE) 10 MG TABS tablet TAKE 1 TABLET(10 MG) BY MOUTH DAILY BEFORE BREAKFAST 90 tablet 3   ezetimibe (ZETIA) 10 MG tablet TAKE 1 TABLET(10 MG) BY MOUTH DAILY 90 tablet 0   fluticasone furoate-vilanterol (BREO ELLIPTA) 200-25 MCG/ACT AEPB Inhale 1 puff into the lungs daily.     furosemide (LASIX) 40 MG tablet Take 1 tablet (40 mg total) by mouth daily. 90 tablet 0   gabapentin (NEURONTIN) 300 MG capsule Take 1 capsule (300 mg total) by mouth 3 (three) times daily. 90 capsule 1   gabapentin (NEURONTIN) 800 MG tablet Take 800 mg by mouth at bedtime.     Humidifier/Vaporizer Supplies (VICKS VAPORIZER SCENT) PADS Apply 1 application. topically daily as needed (cough).     insulin lispro (HUMALOG) 100 UNIT/ML KiwkPen Inject 6-10 Units into the skin 3 (three) times daily.  sliding scale 1 unit for every 10 units of carbs     Insulin Pen Needle 32G X 4 MM MISC Inject 1 each into the skin as needed.     LANTUS SOLOSTAR 100 UNIT/ML Solostar Pen Inject 20-23 Units into the skin daily at 10 pm. Sliding scale based on blood glucose  0   levocetirizine (XYZAL) 5 MG  tablet TAKE 1 TABLET(5 MG) BY MOUTH EVERY EVENING 90 tablet 1   levothyroxine (SYNTHROID) 50 MCG tablet TAKE 1 TABLET BY MOUTH EVERY DAY MONDAY THROUGH SATURDAY AND 2 TABLETS EVERY SUNDAY 102 tablet 0   loratadine (CLARITIN) 10 MG tablet Take 10 mg by mouth in the morning.     metFORMIN (GLUCOPHAGE) 1000 MG tablet Take 1,000 mg by mouth 2 (two) times daily with a meal.     metoprolol succinate (TOPROL-XL) 25 MG 24 hr tablet Take 1 tablet (25 mg total) by mouth daily. 90 tablet 1   montelukast (SINGULAIR) 10 MG tablet Take 10 mg by mouth at bedtime.     Multiple Vitamins-Minerals (MENS MULTI VITAMIN & MINERAL PO) Take 1 tablet by mouth in the morning and at bedtime.     naloxone (NARCAN) nasal  spray 4 mg/0.1 mL For excess sedation from opioids 1 kit 2   nitroGLYCERIN (NITROSTAT) 0.3 MG SL tablet DISSOLVE 1 TABLET UNDER THE TONGUE EVERY 5 MINUTES AS NEEDED FOR CHEST PAIN. MAXIMUM 3 DOSES 25 tablet 0   rosuvastatin (CRESTOR) 5 MG tablet Take 5 mg by mouth daily with supper.  0   Semaglutide, 1 MG/DOSE, 2 MG/1.5ML SOPN Inject 1 mg into the skin daily at 12 noon.     SPIRIVA RESPIMAT 1.25 MCG/ACT AERS INHALE 2 PUFFS INTO THE LUNGS DAILY 4 g 0   Oxycodone HCl 10 MG TABS Take 1 tablet (10 mg total) by mouth in the morning, at noon, in the evening, and at bedtime. 120 tablet 0   Facility-Administered Medications Prior to Visit  Medication Dose Route Frequency Provider Last Rate Last Admin   sodium chloride flush (NS) 0.9 % injection 3 mL  3 mL Intravenous Q12H Furth, Cadence H, PA-C        Review of Systems CNS: No confusion or sedation Cardiac: No angina or palpitations GI: No abdominal pain or constipation Constitutional: No nausea vomiting fevers or chills  Objective:  BP 96/72   Pulse 92   Temp (!) 97.2 F (36.2 C)   Resp 16   Ht 5' (1.524 m)   Wt 189 lb (85.7 kg)   SpO2 98%   BMI 36.91 kg/m    BP Readings from Last 3 Encounters:  12/14/22 96/72  12/03/22 110/70  11/30/22 116/68     Wt Readings from Last 3 Encounters:  12/14/22 189 lb (85.7 kg)  12/03/22 190 lb 8 oz (86.4 kg)  11/30/22 192 lb (87.1 kg)     Physical Exam Pt is alert and oriented PERRL EOMI HEART IS RRR no murmur or rub LCTA no wheezing or rales MUSCULOSKELETAL reveals some paraspinous muscle tenderness but no overt trigger points.  He ambulates with assistance of a cane and does have an antalgic gait with difficulty with mobility.  Muscle tone and bulk is slightly diminished over the past year to 2 secondary to inactivity as reported.  Labs  Lab Results  Component Value Date   HGBA1C 7.7 06/24/2022   HGBA1C 7.8 03/24/2022   HGBA1C 7.6 (A) 10/21/2021   Lab Results  Component Value  Date   MICROALBUR 1.4 05/05/2019   LDLCALC 40 06/24/2022   CREATININE 1.26 (H) 11/25/2022    -------------------------------------------------------------------------------------------------------------------- Lab Results  Component Value Date   WBC 5.3 06/05/2022   HGB 13.8 06/05/2022   HCT 42.5 06/05/2022   PLT 155 06/05/2022   GLUCOSE 61 (L) 11/25/2022   CHOL 106 06/24/2022   TRIG 133 06/24/2022   HDL 39 06/24/2022  LDLDIRECT 36 07/17/2021   LDLCALC 40 06/24/2022   ALT 14 06/24/2022   AST 16 06/24/2022   NA 137 11/25/2022   K 3.8 11/25/2022   CL 101 11/25/2022   CREATININE 1.26 (H) 11/25/2022   BUN 25 (H) 11/25/2022   CO2 29 11/25/2022   TSH 0.49 06/24/2022   PSA 0.56 09/23/2021   INR 1.26 07/07/2018   HGBA1C 7.7 06/24/2022   MICROALBUR 1.4 05/05/2019    --------------------------------------------------------------------------------------------------------------------- No results found.   Assessment & Plan:   There are no diagnoses linked to this encounter.      ----------------------------------------------------------------------------------------------------------------------  Problem List Items Addressed This Visit   None     ----------------------------------------------------------------------------------------------------------------------  There are no diagnoses linked to this encounter.   ----------------------------------------------------------------------------------------------------------------------  I am having Edgar Hartmann. Derhammer "[name]" start on Oxycodone HCl. I am also having him maintain his insulin lispro, loratadine, gabapentin, metFORMIN, Lantus SoloStar, naloxone, rosuvastatin, Insulin Pen Needle, aspirin EC, gabapentin, Multiple Vitamins-Minerals (MENS MULTI VITAMIN & MINERAL PO), montelukast, albuterol, ammonium lactate, DULoxetine, albuterol, Vicks Vaporizer Scent, levocetirizine, cyclobenzaprine, fluticasone furoate-vilanterol,  clopidogrel, Semaglutide (1 MG/DOSE), levothyroxine, DULoxetine, dexlansoprazole, ezetimibe, Spiriva Respimat, furosemide, empagliflozin, metoprolol succinate, nitroGLYCERIN, and Oxycodone HCl. We will continue to administer sodium chloride flush.   Meds ordered this encounter  Medications   triamcinolone acetonide (KENALOG-40) injection 40 mg   sodium chloride flush (NS) 0.9 % injection 10 mL   ropivacaine (PF) 2 mg/mL (0.2%) (NAROPIN) injection 10 mL   lidocaine (PF) (XYLOCAINE) 1 % injection 5 mL   iohexol (OMNIPAQUE) 180 MG/ML injection 10 mL   Oxycodone HCl 10 MG TABS    Sig: Take 1 tablet (10 mg total) by mouth in the morning, at noon, in the evening, and at bedtime.    Dispense:  120 tablet    Refill:  0   Oxycodone HCl 10 MG TABS    Sig: Take 1 tablet (10 mg total) by mouth in the morning, at noon, in the evening, and at bedtime.    Dispense:  120 tablet    Refill:  0   Patient's Medications  New Prescriptions   OXYCODONE HCL 10 MG TABS    Take 1 tablet (10 mg total) by mouth in the morning, at noon, in the evening, and at bedtime.  Previous Medications   ALBUTEROL (PROVENTIL) (2.5 MG/3ML) 0.083% NEBULIZER SOLUTION    Take 3 mLs (2.5 mg total) by nebulization every 6 (six) hours as needed for wheezing or shortness of breath.   ALBUTEROL (VENTOLIN HFA) 108 (90 BASE) MCG/ACT INHALER    Inhale 2 puffs into the lungs daily.   AMMONIUM LACTATE (AMLACTIN) 12 % LOTION    Apply 1 application topically as needed for dry skin.   ASPIRIN EC 81 MG TABLET    Take 1 tablet (81 mg total) by mouth daily.   CLOPIDOGREL (PLAVIX) 75 MG TABLET    Take 1 tablet (75 mg) by mouth daily with breakfast   CYCLOBENZAPRINE (FLEXERIL) 10 MG TABLET    TAKE 1 TABLET(10 MG) BY MOUTH TWICE DAILY   DEXLANSOPRAZOLE (DEXILANT) 60 MG CAPSULE    TAKE 1 CAPSULE(60 MG) BY MOUTH DAILY   DULOXETINE (CYMBALTA) 30 MG CAPSULE    Take 1 capsule (30 mg total) by mouth daily.   DULOXETINE (CYMBALTA) 60 MG CAPSULE    TAKE 1  CAPSULE(60 MG) BY MOUTH DAILY   EMPAGLIFLOZIN (JARDIANCE) 10 MG TABS TABLET    TAKE 1 TABLET(10 MG) BY MOUTH DAILY BEFORE BREAKFAST   EZETIMIBE (  ZETIA) 10 MG TABLET    TAKE 1 TABLET(10 MG) BY MOUTH DAILY   FLUTICASONE FUROATE-VILANTEROL (BREO ELLIPTA) 200-25 MCG/ACT AEPB    Inhale 1 puff into the lungs daily.   FUROSEMIDE (LASIX) 40 MG TABLET    Take 1 tablet (40 mg total) by mouth daily.   GABAPENTIN (NEURONTIN) 300 MG CAPSULE    Take 1 capsule (300 mg total) by mouth 3 (three) times daily.   GABAPENTIN (NEURONTIN) 800 MG TABLET    Take 800 mg by mouth at bedtime.   HUMIDIFIER/VAPORIZER SUPPLIES (VICKS VAPORIZER SCENT) PADS    Apply 1 application. topically daily as needed (cough).   INSULIN LISPRO (HUMALOG) 100 UNIT/ML KIWKPEN    Inject 6-10 Units into the skin 3 (three) times daily.  sliding scale 1 unit for every 10 units of carbs   INSULIN PEN NEEDLE 32G X 4 MM MISC    Inject 1 each into the skin as needed.   LANTUS SOLOSTAR 100 UNIT/ML SOLOSTAR PEN    Inject 20-23 Units into the skin daily at 10 pm. Sliding scale based on blood glucose   LEVOCETIRIZINE (XYZAL) 5 MG TABLET    TAKE 1 TABLET(5 MG) BY MOUTH EVERY EVENING   LEVOTHYROXINE (SYNTHROID) 50 MCG TABLET    TAKE 1 TABLET BY MOUTH EVERY DAY MONDAY THROUGH SATURDAY AND 2 TABLETS EVERY SUNDAY   LORATADINE (CLARITIN) 10 MG TABLET    Take 10 mg by mouth in the morning.   METFORMIN (GLUCOPHAGE) 1000 MG TABLET    Take 1,000 mg by mouth 2 (two) times daily with a meal.   METOPROLOL SUCCINATE (TOPROL-XL) 25 MG 24 HR TABLET    Take 1 tablet (25 mg total) by mouth daily.   MONTELUKAST (SINGULAIR) 10 MG TABLET    Take 10 mg by mouth at bedtime.   MULTIPLE VITAMINS-MINERALS (MENS MULTI VITAMIN & MINERAL PO)    Take 1 tablet by mouth in the morning and at bedtime.   NALOXONE (NARCAN) NASAL SPRAY 4 MG/0.1 ML    For excess sedation from opioids   NITROGLYCERIN (NITROSTAT) 0.3 MG SL TABLET    DISSOLVE 1 TABLET UNDER THE TONGUE EVERY 5 MINUTES AS NEEDED  FOR CHEST PAIN. MAXIMUM 3 DOSES   ROSUVASTATIN (CRESTOR) 5 MG TABLET    Take 5 mg by mouth daily with supper.   SEMAGLUTIDE, 1 MG/DOSE, 2 MG/1.5ML SOPN    Inject 1 mg into the skin daily at 12 noon.   SPIRIVA RESPIMAT 1.25 MCG/ACT AERS    INHALE 2 PUFFS INTO THE LUNGS DAILY  Modified Medications   Modified Medication Previous Medication   OXYCODONE HCL 10 MG TABS Oxycodone HCl 10 MG TABS      Take 1 tablet (10 mg total) by mouth in the morning, at noon, in the evening, and at bedtime.    Take 1 tablet (10 mg total) by mouth in the morning, at noon, in the evening, and at bedtime.  Discontinued Medications   No medications on file   ----------------------------------------------------------------------------------------------------------------------  Follow-up: Return in about 1 month (around 01/14/2023) for evaluation, med refill.  1. DDD (degenerative disc disease), lumbar   2. Sciatica, left side   3. Left leg weakness   4. Lumbar spondylosis with myelopathy   5. Chronic pain syndrome   6. Cervicalgia   7. Chronic, continuous use of opioids   8. Facet arthritis of lumbosacral region   9. 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 Myasthenia gravis ([place])   Based on our discussion today [name] would like to proceed with a repeat epidural as these continue to work well for him and keep his chronic low back hip and lower extremity sciatic symptoms under control.  He generally gets excellent relief rated at 75% improvement in his back pain and more significant relief with his left lower extremity pain.  He continues to take his medications additionally for control of the above where he has failed more conservative therapy.  He denies any side effects with the medications and continues to do well.  I have reviewed the [facility] practitioner database information I think is appropriate for refill today.  He is to be dated for March 21 and April 20 with  return to clinic scheduled in 1 month.  I encouraged him to continue follow-up with his primary care physician for baseline medical care.   Procedure: L5-S1 LESI with fluoroscopic guidance and without moderate sedation  NOTE: The risks, benefits, and expectations of the procedure have been discussed and explained to the patient who was understanding and in agreement with suggested treatment plan. No guarantees were made.  DESCRIPTION OF PROCEDURE: Lumbar epidural steroid injection with no IV Versed, EKG, blood pressure, pulse, and pulse oximetry monitoring. The procedure was performed with the patient in the prone position under fluoroscopic guidance.  Sterile prep x3 was initiated and I then injected subcutaneous lidocaine to the overlying L5-S1 site after its fluoroscopic identifictation.  Using strict aseptic technique, I then advanced an 18-gauge Tuohy epidural needle in the midline using interlaminar approach via loss-of-resistance to saline technique. There was negative aspiration for heme or  CSF.  I then confirmed position with both AP and Lateral fluoroscan.  2 cc of contrast dye were injected and a  total of 5 mL of Preservative-Free normal saline mixed with 40 mg of Kenalog and 1cc Ropicaine 0.2 percent were injected incrementally via the  epidurally placed needle. The needle was removed. The patient tolerated the injection well and was convalesced and discharged to home in stable condition. Should the patient have any post procedure difficulty they have been instructed on how to contact us for assistance.   Molli Barrows, MD

## 2022-12-14 NOTE — Patient Instructions (Signed)
Pain Management Discharge Instructions  General Discharge Instructions :  If you need to reach your doctor call: Monday-Friday 8:00 am - 4:00 pm at [phone] or toll free [phone].  After clinic hours [phone] to have operator reach doctor.  Bring all of your medication bottles to all your appointments in the pain clinic.  To cancel or reschedule your appointment with Pain Management please remember to call 24 hours in advance to avoid a fee.  Refer to the educational materials which you have been given on: General Risks, I had my Procedure. Discharge Instructions, Post Sedation.  Post Procedure Instructions:  The drugs you were given will stay in your system until tomorrow, so for the next 24 hours you should not drive, make any legal decisions or drink any alcoholic beverages.  You may eat anything you prefer, but it is better to start with liquids then soups and crackers, and gradually work up to solid foods.  Please notify your doctor immediately if you have any unusual bleeding, trouble breathing or pain that is not related to your normal pain.  Depending on the type of procedure that was done, some parts of your body may feel week and/or numb.  This usually clears up by tonight or the next day.  Walk with the use of an assistive device or accompanied by an adult for the 24 hours.  You may use ice on the affected area for the first 24 hours.  Put ice in a Ziploc bag and cover with a towel and place against area 15 minutes on 15 minutes off.  You may switch to heat after 24 hours.Epidural Steroid Injection Patient Information  Description: The epidural space surrounds the nerves as they exit the spinal cord.  In some patients, the nerves can be compressed and inflamed by a bulging disc or a tight spinal canal (spinal stenosis).  By injecting steroids into the epidural space, we can bring irritated nerves into direct contact with a potentially helpful medication.  These  steroids act directly on the irritated nerves and can reduce swelling and inflammation which often leads to decreased pain.  Epidural steroids may be injected anywhere along the spine and from the neck to the low back depending upon the location of your pain.   After numbing the skin with local anesthetic (like Novocaine), a small needle is passed into the epidural space slowly.  You may experience a sensation of pressure while this is being done.  The entire block usually last less than 10 minutes.  Conditions which may be treated by epidural steroids:  Low back and leg pain Neck and arm pain Spinal stenosis Post-laminectomy syndrome Herpes zoster (shingles) pain Pain from compression fractures  Preparation for the injection:  Do not eat any solid food or dairy products within 8 hours of your appointment.  You may drink clear liquids up to 3 hours before appointment.  Clear liquids include water, black coffee, juice or soda.  No milk or cream please. You may take your regular medication, including pain medications, with a sip of water before your appointment  Diabetics should hold regular insulin (if taken separately) and take 1/2 normal NPH dos the morning of the procedure.  Carry some sugar containing items with you to your appointment. A driver must accompany you and be prepared to drive you home after your procedure.  Bring all your current medications with your. An IV may be inserted and sedation may be given at the discretion of the physician.     A blood pressure cuff, EKG and other monitors will often be applied during the procedure.  Some patients may need to have extra oxygen administered for a short period. You will be asked to provide medical information, including your allergies, prior to the procedure.  We must know immediately if you are taking blood thinners (like Coumadin/Warfarin)  Or if you are allergic to IV iodine contrast (dye). We must know if you could possible be  pregnant.  Possible side-effects: Bleeding from needle site Infection (rare, may require surgery) Nerve injury (rare) Numbness & tingling (temporary) Difficulty urinating (rare, temporary) Spinal headache ( a headache worse with upright posture) Light -headedness (temporary) Pain at injection site (several days) Decreased blood pressure (temporary) Weakness in arm/leg (temporary) Pressure sensation in back/neck (temporary)  Call if you experience: Fever/chills associated with headache or increased back/neck pain. Headache worsened by an upright position. New onset weakness or numbness of an extremity below the injection site Hives or difficulty breathing (go to the emergency room) Inflammation or drainage at the infection site Severe back/neck pain Any new symptoms which are concerning to you  Please note:  Although the local anesthetic injected can often make your back or neck feel good for several hours after the injection, the pain will likely return.  It takes 3-7 days for steroids to work in the epidural space.  You may not notice any pain relief for at least that one week.  If effective, we will often do a series of three injections spaced 3-6 weeks apart to maximally decrease your pain.  After the initial series, we generally will wait several months before considering a repeat injection of the same type.  If you have any questions, please call [phone] [facility] 

## 2022-12-14 NOTE — Progress Notes (Signed)
Nursing Pain Medication Assessment:  Safety precautions to be maintained throughout the outpatient stay will include: orient to surroundings, keep bed in low position, maintain call bell within reach at all times, provide assistance with transfer out of bed and ambulation.  Medication Inspection Compliance: Pill count conducted under aseptic conditions, in front of the patient. Neither the pills nor the bottle was removed from the patient's sight at any time. Once count was completed pills were immediately returned to the patient in their original bottle.  Medication: Oxycodone IR Pill/Patch Count:  44 of 120 pills remain Pill/Patch Appearance: Markings consistent with prescribed medication Bottle Appearance: Standard pharmacy container. Clearly labeled. Filled Date: 2 / 20 / 2024 Last Medication intake:  Today

## 2022-12-14 NOTE — Progress Notes (Unsigned)
Safety precautions to be maintained throughout the outpatient stay will include: orient to surroundings, keep bed in low position, maintain call bell within reach at all times, provide assistance with transfer out of bed and ambulation.  

## 2022-12-15 ENCOUNTER — Telehealth: Payer: Self-pay

## 2022-12-15 ENCOUNTER — Ambulatory Visit: Payer: Self-pay

## 2022-12-15 NOTE — Telephone Encounter (Signed)
Summary: Symptoms worsening, seeking Abx   Pt's wife called reporting that the patient's symptoms have gotten worse. Pt's wife is requesting for another Abx cycle to get called in for him.  [facility] [id number] [id number] Phone: [phone] Fax: [phone]    Called pt - left message on machine.  Reason for Disposition . Cough has been present for > 3 weeks  Answer Assessment - Initial Assessment Questions 1. ONSET: "When did the cough begin?"      Early February 2. SEVERITY: "How bad is the cough today?"      Moderate 3. SPUTUM: "Describe the color of your sputum" (none, dry cough; clear, white, yellow, green)     Green 4. HEMOPTYSIS: "Are you coughing up any blood?" If so ask: "How much?" (flecks, streaks, tablespoons, etc.)      5. DIFFICULTY BREATHING: "Are you having difficulty breathing?" If Yes, ask: "How bad is it?" (e.g., mild, moderate, severe)    - MILD: No SOB at rest, mild SOB with walking, speaks normally in sentences, can lie down, no retractions, pulse < 100.    - MODERATE: SOB at rest, SOB with minimal exertion and prefers to sit, cannot lie down flat, speaks in phrases, mild retractions, audible wheezing, pulse 100-120.    - SEVERE: Very SOB at rest, speaks in single words, struggling to breathe, sitting hunched forward, retractions, pulse > 120   6. FEVER: "Do you have a fever?" If Yes, ask: "What is your temperature, how was it measured, and when did it start?"      7. CARDIAC HISTORY: "Do you have any history of heart disease?" (e.g., heart attack, congestive heart failure)      Heart failure 8. LUNG HISTORY: "Do you have any history of lung disease?"  (e.g., pulmonary embolus, asthma, emphysema)     Asthma 9. PE RISK FACTORS: "Do you have a history of blood clots?" (or: recent major surgery, recent prolonged travel, bedridden)      10. OTHER  SYMPTOMS: "Do you have any other symptoms?" (e.g., runny nose, wheezing, chest pain)       Nose is clear 11. PREGNANCY: "Is there any chance you are pregnant?" "When was your last menstrual period?"        12. TRAVEL: "Have you traveled out of the country in the last month?" (e.g., travel history, exposures)  Protocols used: Cough - Acute Productive-A-AH

## 2022-12-15 NOTE — Telephone Encounter (Signed)
Summary: Symptoms worsening, seeking Abx   Pt's wife called reporting that the patient's symptoms have gotten worse. Pt's wife is requesting for another Abx cycle to get called in for him.  [facility] [id number], [address] AT [place] [address] Phone: [phone] Fax: [phone]     Called pt - left message on machine to call back.

## 2022-12-15 NOTE — Telephone Encounter (Signed)
Summary: Symptoms worsening, seeking Abx   Pt's wife called reporting that the patient's symptoms have gotten worse. Pt's wife is requesting for another Abx cycle to get called in for him.  [facility] DR[facility] ST[facility] [id number] [address], [address] AT [place] [address] Phone: [phone] Fax: [phone]      Called pt - left message on machine.

## 2022-12-15 NOTE — Telephone Encounter (Signed)
  Chief Complaint: Coughing up green phlegm - cough Symptoms: above Frequency: beginning of February Pertinent Negatives: Patient denies  Disposition: [] ED /[] Urgent Care (no appt availability in office) / [x] Appointment(In office/virtual)/ []  [place] [facility]/ [] Home Care/ [] Refused Recommended Disposition /[] [place] [facility]/ []  Follow-up with PCP Additional Notes: PT continues with same cough since the beginning of February. Pt called to request additional Abx.         Summary: Symptoms worsening, seeking Abx     Pt's wife called reporting that the patient's symptoms have gotten worse. Pt's wife is requesting for another Abx cycle to get called in for him.  [facility] #[id number] [address], [address] AT [place] [address] Phone: [phone] Fax: [phone]      Called pt - left message on machine

## 2022-12-15 NOTE — Telephone Encounter (Signed)
Post procedure follow up.  No answer.  

## 2022-12-16 ENCOUNTER — Encounter: Payer: Self-pay | Admitting: Internal Medicine

## 2022-12-16 ENCOUNTER — Ambulatory Visit (INDEPENDENT_AMBULATORY_CARE_PROVIDER_SITE_OTHER): Payer: Medicare Other | Admitting: Internal Medicine

## 2022-12-16 ENCOUNTER — Other Ambulatory Visit: Payer: Self-pay | Admitting: Anesthesiology

## 2022-12-16 ENCOUNTER — Other Ambulatory Visit: Payer: Self-pay | Admitting: Family Medicine

## 2022-12-16 VITALS — BP 118/64 | HR 113 | Temp 97.5°F | Resp 16 | Ht 67.0 in | Wt 194.0 lb

## 2022-12-16 DIAGNOSIS — R052 Subacute cough: Secondary | ICD-10-CM

## 2022-12-16 DIAGNOSIS — J4540 Moderate persistent asthma, uncomplicated: Secondary | ICD-10-CM

## 2022-12-16 DIAGNOSIS — J45901 Unspecified asthma with (acute) exacerbation: Secondary | ICD-10-CM | POA: Diagnosis not present

## 2022-12-16 LAB — POCT INFLUENZA A/B
Influenza A, POC: NEGATIVE
Influenza B, POC: NEGATIVE

## 2022-12-16 MED ORDER — BENZONATATE 100 MG PO CAPS: 100.0000 mg | ORAL_CAPSULE | Freq: Two times a day (BID) | ORAL | 0 refills | Status: DC | PRN

## 2022-12-16 MED ORDER — PREDNISONE 20 MG PO TABS: 40.0000 mg | ORAL_TABLET | Freq: Every day | ORAL | 0 refills | Status: AC

## 2022-12-16 NOTE — Patient Instructions (Addendum)
It was great seeing you today!  Plan discussed at today's visit: -Chest x-ray today, based on this may do more antibiotics -COVID and flu test today -Prednisone prescribed to take 40 mg daily for 5 days -Try new inhaler -Continue Mucinex in the morning, tessalon perles at night   Follow up in: as needed   Take care and let us know if you have any questions or concerns prior to your next visit.  Dr. Rosana Berger

## 2022-12-16 NOTE — Progress Notes (Signed)
Acute Office Visit  Subjective:     Patient ID: [name], male    DOB: 26-May-1961, 62 y.o.   MRN: [id number]  Chief Complaint  Patient presents with   URI    Cough, green phlegm onset 1 month    HPI Patient is in today for cough, congestion. Symptoms have been going on for 1 month but woke up this morning with new hoarseness and myalgias and fatigue.  URI Compliant:  -Fever: no -Cough: yes, productive green cough -Shortness of breath: yes -Wheezing: yes -Chest congestion: yes -Nasal congestion: no -Runny nose: no -Post nasal drip: yes -Sore throat: no -Sinus pressure: no -Headache: no -Face pain: no -Ear pain: no  -Ear pressure: yes bilateral -Fatigue: yes -Sick contacts: yes -Context: worse -Treatments attempted: Prednisone, Doxycycline (finished 2 weeks ago), Tylenol, Mucinex    Review of Systems  Constitutional:  Positive for malaise/fatigue. Negative for chills and fever.  HENT:  Negative for congestion, ear pain, sinus pain and sore throat.   Eyes:  Negative for blurred vision.  Respiratory:  Positive for cough, sputum production, shortness of breath and wheezing.   Cardiovascular:  Negative for chest pain.        Objective:    BP 118/64   Pulse (!) 113   Temp (!) 97.5 F (36.4 C)   Resp 16   Ht '5\' 7"'$  (1.702 m)   Wt 194 lb (88 kg)   SpO2 94%   BMI 30.38 kg/m  BP Readings from Last 3 Encounters:  12/16/22 118/64  12/14/22 109/67  12/03/22 110/70   Wt Readings from Last 3 Encounters:  12/16/22 194 lb (88 kg)  12/14/22 189 lb (85.7 kg)  12/03/22 190 lb 8 oz (86.4 kg)      Physical Exam Constitutional:      Appearance: Normal appearance.  HENT:     Head: Normocephalic and atraumatic.     Right Ear: Tympanic membrane, ear canal and external ear normal.     Left Ear: Tympanic membrane, ear canal and external ear normal.     Nose: Rhinorrhea present.     Mouth/Throat:     Mouth: Mucous membranes are moist.     Pharynx: Oropharynx  is clear.  Eyes:     Conjunctiva/sclera: Conjunctivae normal.  Cardiovascular:     Rate and Rhythm: Normal rate and regular rhythm.  Pulmonary:     Effort: Pulmonary effort is normal.     Breath sounds: Wheezing present.     Comments: Inspiratory and expiratory wheezing throughout  Skin:    General: Skin is warm and dry.  Neurological:     General: No focal deficit present.     Mental Status: He is alert. Mental status is at baseline.  Psychiatric:        Mood and Affect: Mood normal.        Behavior: Behavior normal.     No results found for any visits on 12/16/22.      Assessment & Plan:   1. Moderate asthma with acute exacerbation, unspecified whether persistent/Subacute cough: Asthma exacerbated today despite being treated with steroids and Doxycycline about 2 weeks ago. Will send for chest x-ray today and prescribed around round of Prednisone 40 mg x 4 days. Switch from [employer] daily to [place], sample given. Continue Mucinex in the morning, tessalon perles at night. As he is having new symptoms, test for COVID and flu today as well.   - predniSONE (DELTASONE) 20 MG tablet; Take 2 tablets (  40 mg total) by mouth daily with breakfast for 5 days.  Dispense: 10 tablet; Refill: 0 - DG Chest 2 View; Future - benzonatate (TESSALON) 100 MG capsule; Take 1 capsule (100 mg total) by mouth 2 (two) times daily as needed for cough.  Dispense: 20 capsule; Refill: 0 - Novel Coronavirus, NAA ([facility]) - POCT Influenza A/B   Return if symptoms worsen or fail to improve.  Teodora Medici, DO

## 2022-12-17 LAB — SPECIMEN STATUS REPORT

## 2022-12-17 LAB — NOVEL CORONAVIRUS, NAA: SARS-CoV-2, NAA: NOT DETECTED

## 2022-12-24 ENCOUNTER — Telehealth: Payer: Self-pay | Admitting: Anesthesiology

## 2022-12-24 ENCOUNTER — Other Ambulatory Visit: Payer: Self-pay | Admitting: *Deleted

## 2022-12-24 NOTE — Telephone Encounter (Signed)
Rx request sent to Dr. Adams. 

## 2022-12-24 NOTE — Telephone Encounter (Signed)
Patient states Dr [name] did not send in his cyclobenzaprine. Please ask him to send that in. Patient needs refills

## 2022-12-27 ENCOUNTER — Other Ambulatory Visit: Payer: Self-pay | Admitting: Nurse Practitioner

## 2022-12-28 NOTE — Telephone Encounter (Signed)
He left a vm wanting someone to call him about his prescription for cyclobenzaprine

## 2022-12-30 ENCOUNTER — Other Ambulatory Visit: Payer: Self-pay | Admitting: Anesthesiology

## 2022-12-31 ENCOUNTER — Telehealth: Payer: Self-pay | Admitting: Anesthesiology

## 2022-12-31 ENCOUNTER — Other Ambulatory Visit: Payer: Self-pay | Admitting: Anesthesiology

## 2022-12-31 NOTE — Telephone Encounter (Signed)
Refill medication request sent to Dr. [name] for approval.

## 2022-12-31 NOTE — Telephone Encounter (Signed)
PT stated that Dr. [name] were suppose to send in prescription for Cyclobenzaprine. PT stated it should been called in the at the same time as the oxycodone. Please give patient a call. TY

## 2023-01-01 ENCOUNTER — Other Ambulatory Visit: Payer: Self-pay | Admitting: *Deleted

## 2023-01-01 ENCOUNTER — Telehealth: Payer: Self-pay | Admitting: *Deleted

## 2023-01-01 ENCOUNTER — Other Ambulatory Visit: Payer: Self-pay | Admitting: Family Medicine

## 2023-01-01 DIAGNOSIS — F411 Generalized anxiety disorder: Secondary | ICD-10-CM

## 2023-01-01 DIAGNOSIS — F331 Major depressive disorder, recurrent, moderate: Secondary | ICD-10-CM

## 2023-01-01 MED ORDER — CYCLOBENZAPRINE HCL 10 MG PO TABS: ORAL_TABLET | ORAL | 5 refills | Status: DC

## 2023-01-01 MED ORDER — CYCLOBENZAPRINE HCL 10 MG PO TABS: 10.0000 mg | ORAL_TABLET | Freq: Three times a day (TID) | ORAL | 0 refills | Status: DC | PRN

## 2023-01-01 NOTE — Telephone Encounter (Signed)
Patient was out of his flexeril and needing refill.  Communicated with Dr [name] via text and he approved my sending Rx in for the Flexeril.  1st attempt was incorrect qty and sig.  Resent for the correct qty and sig of 60 1 tablet bid.  Spoke to pharmacist at [facility] and he will delete the first one that was incorrect and has received the correct Rx. Voicemail left with patient.

## 2023-01-06 ENCOUNTER — Ambulatory Visit: Payer: Medicare Other | Attending: Nurse Practitioner

## 2023-01-06 DIAGNOSIS — I5023 Acute on chronic systolic (congestive) heart failure: Secondary | ICD-10-CM

## 2023-01-06 LAB — ECHOCARDIOGRAM COMPLETE
AR max vel: 3.46 cm2
AV Area VTI: 3.57 cm2
AV Area mean vel: 3.52 cm2
AV Mean grad: 2 mmHg
AV Peak grad: 3.8 mmHg
Ao pk vel: 0.97 m/s
Calc EF: 59.4 %
S' Lateral: 2.1 cm
Single Plane A2C EF: 61.7 %
Single Plane A4C EF: 59.7 %

## 2023-01-06 MED ORDER — PERFLUTREN LIPID MICROSPHERE
1.0000 mL | INTRAVENOUS | Status: AC | PRN
Start: 2023-01-06 — End: 2023-01-06
  Administered 2023-01-06: 2 mL via INTRAVENOUS

## 2023-01-13 ENCOUNTER — Encounter: Payer: Self-pay | Admitting: Anesthesiology

## 2023-01-13 ENCOUNTER — Ambulatory Visit: Payer: Medicare Other | Attending: Anesthesiology | Admitting: Anesthesiology

## 2023-01-13 DIAGNOSIS — F1190 Opioid use, unspecified, uncomplicated: Secondary | ICD-10-CM

## 2023-01-13 DIAGNOSIS — R29898 Other symptoms and signs involving the musculoskeletal system: Secondary | ICD-10-CM

## 2023-01-13 DIAGNOSIS — M4716 Other spondylosis with myelopathy, lumbar region: Secondary | ICD-10-CM | POA: Diagnosis not present

## 2023-01-13 DIAGNOSIS — G7000 Myasthenia gravis without (acute) exacerbation: Secondary | ICD-10-CM

## 2023-01-13 DIAGNOSIS — M545 Low back pain, unspecified: Secondary | ICD-10-CM

## 2023-01-13 DIAGNOSIS — G894 Chronic pain syndrome: Secondary | ICD-10-CM

## 2023-01-13 DIAGNOSIS — M5432 Sciatica, left side: Secondary | ICD-10-CM | POA: Diagnosis not present

## 2023-01-13 DIAGNOSIS — M47817 Spondylosis without myelopathy or radiculopathy, lumbosacral region: Secondary | ICD-10-CM

## 2023-01-13 DIAGNOSIS — M5136 Other intervertebral disc degeneration, lumbar region: Secondary | ICD-10-CM

## 2023-01-13 DIAGNOSIS — M542 Cervicalgia: Secondary | ICD-10-CM

## 2023-01-13 MED ORDER — OXYCODONE HCL 10 MG PO TABS: 10.0000 mg | ORAL_TABLET | Freq: Four times a day (QID) | ORAL | 0 refills | Status: DC

## 2023-01-13 NOTE — Progress Notes (Signed)
Virtual Visit via Telephone Note  I connected with [name] on 01/13/23 at 11:00 AM EDT by telephone and verified that I am speaking with the correct person using two identifiers.  Location: Patient: Home Provider: Pain control center   I discussed the limitations, risks, security and privacy concerns of performing an evaluation and management service by telephone and the availability of in person appointments. I also discussed with the patient that there may be a patient responsible charge related to this service. The patient expressed understanding and agreed to proceed.   History of Present Illness: As of the catch up with [name] via telephone as we are unable link for the video portion conference.  He reports that his epidural in March was successful.  He had a 70% reduction in his low back pain and nearly complete relief of the left leg sciatica.  He still has some diffuse body pain comparable to what he is experienced in the past and continues to take his opioids as prescribed and these continue to work well for him without side effect.  The quality characteristic and distribution of his pain is otherwise stable without recent change.  He feels he had about a 70% reduction in pain dropping from a VAS of 7 down to a 2.  He is trying to stay active doing some stretching exercises but is still limited secondary to some persistent weakness which has been chronic.  No change in bowel or bladder function is noted.  Otherwise he is in his usual state of health.  Review of systems: General: No fevers or chills Pulmonary: No shortness of breath or dyspnea Cardiac: No angina or palpitations or lightheadedness GI: No abdominal pain or constipation Psych: No depression    Observations/Objective:  Current Outpatient Medications:    albuterol (PROVENTIL) (2.5 MG/3ML) 0.083% nebulizer solution, Take 3 mLs (2.5 mg total) by nebulization every 6 (six) hours as needed for wheezing or shortness  of breath., Disp: 75 mL, Rfl: 2   albuterol (VENTOLIN HFA) 108 (90 Base) MCG/ACT inhaler, Inhale 2 puffs into the lungs daily., Disp: , Rfl:    ammonium lactate (AMLACTIN) 12 % lotion, Apply 1 application topically as needed for dry skin., Disp: 400 g, Rfl: 3   aspirin EC 81 MG tablet, Take 1 tablet (81 mg total) by mouth daily., Disp: 90 tablet, Rfl: 3   benzonatate (TESSALON) 100 MG capsule, Take 1 capsule (100 mg total) by mouth 2 (two) times daily as needed for cough., Disp: 20 capsule, Rfl: 0   clopidogrel (PLAVIX) 75 MG tablet, TAKE 1 TABLET(75 MG) BY MOUTH DAILY WITH BREAKFAST PEASE SCHEDULE OFFICE VISIT FOR FURTHER REFILLS. THANK YOU!, Disp: 30 tablet, Rfl: 1   cyclobenzaprine (FLEXERIL) 10 MG tablet, TAKE 1 TABLET(10 MG) BY MOUTH TWICE DAILY AS NEEDED, Disp: 30 tablet, Rfl: 3   cyclobenzaprine (FLEXERIL) 10 MG tablet, TAKE 1 TABLET(10 MG) BY MOUTH TWICE DAILY, Disp: 60 tablet, Rfl: 5   dexlansoprazole (DEXILANT) 60 MG capsule, TAKE 1 CAPSULE(60 MG) BY MOUTH DAILY, Disp: 30 capsule, Rfl: 0   DULoxetine (CYMBALTA) 30 MG capsule, Take 1 capsule (30 mg total) by mouth daily. With one 60 mg capsules, Disp: 90 capsule, Rfl: 1   DULoxetine (CYMBALTA) 60 MG capsule, TAKE 1 CAPSULE(60 MG) BY MOUTH DAILY, Disp: 90 capsule, Rfl: 1   empagliflozin (JARDIANCE) 10 MG TABS tablet, TAKE 1 TABLET(10 MG) BY MOUTH DAILY BEFORE BREAKFAST, Disp: 90 tablet, Rfl: 3   ezetimibe (ZETIA) 10 MG tablet, TAKE  1 TABLET(10 MG) BY MOUTH DAILY, Disp: 90 tablet, Rfl: 0   fluticasone furoate-vilanterol (BREO ELLIPTA) 200-25 MCG/ACT AEPB, Inhale 1 puff into the lungs daily., Disp: , Rfl:    furosemide (LASIX) 40 MG tablet, Take 1 tablet (40 mg total) by mouth daily., Disp: 90 tablet, Rfl: 0   gabapentin (NEURONTIN) 300 MG capsule, Take 1 capsule (300 mg total) by mouth 3 (three) times daily., Disp: 90 capsule, Rfl: 1   gabapentin (NEURONTIN) 800 MG tablet, Take 800 mg by mouth at bedtime., Disp: , Rfl:    Humidifier/Vaporizer  Supplies (VICKS VAPORIZER SCENT) PADS, Apply 1 application. topically daily as needed (cough)., Disp: , Rfl:    insulin lispro (HUMALOG) 100 UNIT/ML KiwkPen, Inject 6-10 Units into the skin 3 (three) times daily.  sliding scale 1 unit for every 10 units of carbs, Disp: , Rfl:    Insulin Pen Needle 32G X 4 MM MISC, Inject 1 each into the skin as needed., Disp: , Rfl:    LANTUS SOLOSTAR 100 UNIT/ML Solostar Pen, Inject 20-23 Units into the skin daily at 10 pm. Sliding scale based on blood glucose, Disp: , Rfl: 0   levocetirizine (XYZAL) 5 MG tablet, TAKE 1 TABLET(5 MG) BY MOUTH EVERY EVENING, Disp: 90 tablet, Rfl: 1   levothyroxine (SYNTHROID) 50 MCG tablet, TAKE 1 TABLET BY MOUTH EVERY DAY MONDAY THROUGH SATURDAY AND 2 TABLETS EVERY SUNDAY, Disp: 102 tablet, Rfl: 0   loratadine (CLARITIN) 10 MG tablet, Take 10 mg by mouth in the morning., Disp: , Rfl:    metFORMIN (GLUCOPHAGE) 1000 MG tablet, Take 1,000 mg by mouth 2 (two) times daily with a meal., Disp: , Rfl:    metoprolol succinate (TOPROL-XL) 25 MG 24 hr tablet, Take 1 tablet (25 mg total) by mouth daily., Disp: 90 tablet, Rfl: 1   montelukast (SINGULAIR) 10 MG tablet, Take 10 mg by mouth at bedtime., Disp: , Rfl:    Multiple Vitamins-Minerals (MENS MULTI VITAMIN & MINERAL PO), Take 1 tablet by mouth in the morning and at bedtime., Disp: , Rfl:    naloxone (NARCAN) nasal spray 4 mg/0.1 mL, For excess sedation from opioids, Disp: 1 kit, Rfl: 2   nitroGLYCERIN (NITROSTAT) 0.3 MG SL tablet, DISSOLVE 1 TABLET UNDER THE TONGUE EVERY 5 MINUTES AS NEEDED FOR CHEST PAIN. MAXIMUM 3 DOSES, Disp: 25 tablet, Rfl: 0   [START ON 01/23/2023] Oxycodone HCl 10 MG TABS, Take 1 tablet (10 mg total) by mouth in the morning, at noon, in the evening, and at bedtime., Disp: 120 tablet, Rfl: 0   [START ON 02/22/2023] Oxycodone HCl 10 MG TABS, Take 1 tablet (10 mg total) by mouth in the morning, at noon, in the evening, and at bedtime., Disp: 120 tablet, Rfl: 0    rosuvastatin (CRESTOR) 5 MG tablet, Take 5 mg by mouth daily with supper., Disp: , Rfl: 0   Semaglutide, 1 MG/DOSE, 2 MG/1.5ML SOPN, Inject 1 mg into the skin daily at 12 noon., Disp: , Rfl:    Tiotropium Bromide Monohydrate (SPIRIVA RESPIMAT) 1.25 MCG/ACT AERS, INHALE 2 PUFFS INTO THE LUNGS DAILY, Disp: 12 g, Rfl: 0  Current Facility-Administered Medications:    sodium chloride flush (NS) 0.9 % injection 3 mL, 3 mL, Intravenous, Q12H, Furth, Cadence H, PA-C   Past Medical History:  Diagnosis Date   Allergy    dust, seasonal (worse in the fall).   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 Followed by Pain Specialist in [place], back and  neck   Asthma    BRONCHITIS   Cataract    First Dx in 2012   Chronic combined systolic and diastolic congestive heart failure    a. 03/2018 Echo: EF 30-35%; b. 07/2018 Echo: EF 35-40%; c. 09/2019 TEE: EF 40-45%; d. 12/2020 Echo: EF 35-40%; e.01/2022  Echo: EF 45-50%. sev apical HK. Nl RV fxn. Ao root 52mm.   Coronary artery disease    a. Prior Ant MI->s/p mult stents->LAD/RCA ([place]); b. 2016 Cath: nonobs dzs;  c. 04/2018 Cath/PCI: LCX 24m (3.25x15 Sierra DES); d. 02/2022 MV: high risk; e. 02/2022 Cath: LM nl, LAD 20p ISR, patent mid-stent, LCX patent stent, OM1 nl, OM2 50p, patent stent, OM3 40p, patent stent, RCA 40p ISR, 94m ISR, 80m/d, patent distal stent, RPDA patent stent, RPAV small, 80 (sl progression)-->Med Rx.   Deaf, left    Diabetes mellitus without complication    TYPE 2   Diabetic peripheral neuropathy    feet and hands   FUO (fever of unknown origin) 08/03/2018   GERD (gastroesophageal reflux disease)    Headache    muscle tension   Hyperlipidemia    Hypertension    Hyperthyroidism    Insomnia    Ischemic cardiomyopathy    a. 03/2018 Echo: EF 30-35%; b. 07/2018 Echo: EF 35-40%, Gr1 DD; c. 09/2019 TEE: EF 40-45%; d. 12/2020 Echo: EF 35-40%. GrI DD; e. 01/2022 Echo: EF 45-50%.   Knee pain, acute 05/06/2020   Left arm weakness 10/04/2019   Left leg  weakness 12/01/2019   Lyme disease    Chronic   Myasthenia gravis    (03/18/21 - no current treatment - "better than it has ever been" per pt)   Myocardial infarction 2010   Palpitations    a. 10/2020 [facility]: RSR, 88 avg. 3 brief SVT episodes (max 5 beats @ 128). Rare PACs/PVCs. Triggered events did not correlate w/ significant arrthymia - some w/ sinus tach.   Seasonal allergies    Sepsis    a.07/2018 - unknown source. TEE neg for veg 09/2019.   Sleep apnea    CPAP   Wears hearing aid in both ears      Assessment and Plan: 1. DDD (degenerative disc disease), lumbar   2. Sciatica, left side   3. Left leg weakness   4. Lumbar spondylosis with myelopathy   5. Chronic pain syndrome   6. Cervicalgia   7. Chronic, continuous use of opioids   8. Facet arthritis of lumbosacral region   9. Low back pain at multiple sites   10. Myasthenia gravis   Based on our conversation it is appropriate to refill his medicines for the next 2 months.  Current medication regimen is set up for April 22 Feb 2027.  I have reviewed the [facility] practitioner database information is appropriate for refill.  He seems to respond favorably to the chronic opioids for his failed more conservative therapy.  He also did well with his most recent epidural injection it has been 1 month I am going to set him up for a repeat injection back in June if necessary.  He generally averages about 3 injections/year and gets about 7080% improvement on average.  Unfortunately has failed more conservative therapy.  I have encouraged him to continue with stretching exercises and I do want him to follow-up with his primary care physicians for baseline medical care.  With return to clinic in 2 months  Follow Up Instructions:    I discussed the assessment and treatment  plan with the patient. The patient was provided an opportunity to ask questions and all were answered. The patient agreed with the plan and demonstrated an understanding of  the instructions.   The patient was advised to call back or seek an in-person evaluation if the symptoms worsen or if the condition fails to improve as anticipated.  I provided 30 minutes of non-face-to-face time during this encounter.   Yevette Edwards, MD

## 2023-01-28 ENCOUNTER — Encounter: Payer: Self-pay | Admitting: Oncology

## 2023-01-28 ENCOUNTER — Other Ambulatory Visit: Payer: Self-pay | Admitting: Cardiovascular Disease

## 2023-01-28 NOTE — Telephone Encounter (Signed)
12/03/22--FU with in 3 months  next visit 03/04/23

## 2023-02-05 ENCOUNTER — Other Ambulatory Visit: Payer: Self-pay | Admitting: Cardiovascular Disease

## 2023-02-10 ENCOUNTER — Other Ambulatory Visit: Payer: Self-pay | Admitting: Family Medicine

## 2023-02-10 DIAGNOSIS — E063 Autoimmune thyroiditis: Secondary | ICD-10-CM

## 2023-02-15 ENCOUNTER — Other Ambulatory Visit: Payer: Self-pay | Admitting: Family Medicine

## 2023-02-15 DIAGNOSIS — J302 Other seasonal allergic rhinitis: Secondary | ICD-10-CM

## 2023-02-15 NOTE — Progress Notes (Unsigned)
Name: [name]   MRN: [id number]    DOB: 13-Aug-1961   Date:02/16/2023       Progress Note  Subjective  Chief Complaint  Follow Up  HPI  Asthma in 2023 he was treated 5 times with prednisone for asthma flares and once in 06/2022 with antibiotics and also prednisone due to bronchitis, since January 2024 he has been treated twice with prednisone. He saw me in January and had wheezing so I added Spiriva to his Breo.  He is still under the care of Dr. Meredeth Ide and has a follow up with him in one month. During his last flare he was switched to Trelegy , but ran out of medication yesterday. He states he continues to have a hacking cough, some body aches and fatigue. No fever or chills.  Advised him to resume Breo and continue Spiriva. Advised to try to see Dr. Meredeth Ide sooner    DMII:  he is under the care of Dr. Gershon Crane , last A1C was done 10/2022 and it was 7.5 % and has an upcoming visit with him.   He has associated HTN, dyslipidemia, neuropathy, microalbuminuria .  He is on statin therapy, gabapentin for neuropathy - under the care of Dr. Stacie Acres and SGL2 agonist  . Taking insulin, Ozempic ,  Metformin , SGL2 agonist.   Myasthenia Gravis: he is currently seeing Dr. Malvin Johns, no longer taking medication, he has mild ptosis on left side but not affecting his vision, he has sometimes generalized weakness. Stable   CHF/CAD with angina: under the care of cardiologist, he is on statin therapy, SGL-2 agonist, lasix daily now and NTG a few times a month when he has chest pain. He used to take ARB and beta blocker but medications were stopped due to low , since Feb 2024 he is back on beta blocker for rate control. He has orthopnea but sob is worse with activity  Chronic Lyme disease: he has joint aches, it was diagnosed many years ago in [place], stable Unchanged    Hypothyroidism: he is currently on 50 mcg of levothyroxine daily but two on Sundays. TSH has been at goal . Denies dysphagia, dry skin and  constipation and that is normal for him   MDD: he has a long history of depression, taking Duloxetine 90 mg daily , it works well for him. He states father died - he had Alzheimer's , now they are struggling financially since his father used to send him money monthly . Waiting for settlement and thinks that will solve his financial problems.  Chronic pain/inflammatory spondyloarthropathy:  he sees pain sub-specialist, Dr. Pernell Dupre,  every 2 months.  He has daily back pain , he states lately pain is going down both legs and has numbness and weakness on both lower legs when he first stands up. He has opioid induced constipation   IMPRESSION: No findings of discitis or osteomyelitis. Mild multilevel lumbar spondylosis, primarily facet arthropathy, with some disc space narrowing L2-L3.   Congenital lumbar stenosis with short pedicles, but no compressive features.  Atherosclerosis of aorta: found on CT chest done back in 2019, taking statin therapy and aspirin Last LDL was at goal at 40   Patient Active Problem List   Diagnosis Date Noted   Moderate episode of recurrent major depressive disorder (HCC) 10/20/2022   Asthma, moderate persistent, poorly-controlled 07/20/2022   Mild protein-calorie malnutrition (HCC) 07/20/2022   Atherosclerosis of aorta (HCC) 07/20/2022   Abnormal nuclear cardiac imaging test    Seasonal  allergies 01/27/2022   Hypothyroidism, acquired, autoimmune 01/27/2022   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) 12/02/2021   Gastropathy 08/13/2021   Iron deficiency anemia due to chronic blood loss    Pain due to onychomycosis of toenails of both feet 11/11/2020   Lung involvement associated with another disorder (HCC) 05/17/2020   Lower urinary tract symptoms 09/15/2019   Incomplete bladder emptying 09/15/2019   Carpal tunnel syndrome on both sides 07/12/2019   Idiopathic peripheral neuropathy 04/21/2019   Sensory ataxia 03/09/2019   OSA on CPAP 07/08/2018   Congestive heart  failure (CHF) (HCC) 05/05/2018   Unstable angina (HCC) 04/26/2018   Ischemic cardiomyopathy 04/26/2018   Chronic combined systolic and diastolic CHF (congestive heart failure) (HCC) 04/26/2018   Anginal equivalent 04/25/2018   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HCC) 01/25/2018   Vitamin D deficiency, unspecified 08/12/2017   Ptosis of left eyelid 01/08/2017   Dermatitis 12/24/2016   Benign neoplasm of sigmoid colon    Benign neoplasm of descending colon    Benign neoplasm of transverse colon    Coronary artery disease involving native coronary artery with angina pectoris (HCC) 10/22/2015   Coronary artery disease 08/22/2015   Myasthenia gravis (HCC) 08/22/2015   Chronic left-sided low back pain 08/22/2015   Hypertension 08/22/2015   GERD (gastroesophageal reflux disease) 08/22/2015   Hyperlipidemia 08/22/2015   Insomnia 08/22/2015   Type 2 diabetes mellitus with diabetic polyneuropathy, with long-term current use of insulin (HCC) 08/22/2015   Asthma 08/22/2015   Lyme disease 06/05/1992    Past Surgical History:  Procedure Laterality Date   BILATERAL CARPAL TUNNEL RELEASE Bilateral L in 2012 and R in 2013   CARDIAC CATHETERIZATION     Several Caths, most recent in  March 2016.   COLONOSCOPY WITH PROPOFOL N/A 01/10/2016   Procedure: COLONOSCOPY WITH PROPOFOL;  Surgeon: Midge Minium, MD;  Location: [facility];  Service: Endoscopy;  Laterality: N/A;   COLONOSCOPY WITH PROPOFOL N/A 04/14/2021   Procedure: COLONOSCOPY WITH PROPOFOL;  Surgeon: Midge Minium, MD;  Location: [facility];  Service: Endoscopy;  Laterality: N/A;  Diabetic - insulin and oral meds   CORONARY ANGIOPLASTY     CORONARY STENT INTERVENTION N/A 04/25/2018   Procedure: CORONARY STENT INTERVENTION;  Surgeon: Iran Ouch, MD;  Location: [facility];  Service: Cardiovascular;  Laterality: N/A;   ESOPHAGOGASTRODUODENOSCOPY (EGD) WITH PROPOFOL N/A 01/10/2016   Procedure:  ESOPHAGOGASTRODUODENOSCOPY (EGD) WITH PROPOFOL;  Surgeon: Midge Minium, MD;  Location: [facility];  Service: Endoscopy;  Laterality: N/A;   ESOPHAGOGASTRODUODENOSCOPY (EGD) WITH PROPOFOL N/A 04/14/2021   Procedure: ESOPHAGOGASTRODUODENOSCOPY (EGD) WITH BIOPSY;  Surgeon: Midge Minium, MD;  Location: [facility];  Service: Endoscopy;  Laterality: N/A;   EYE SURGERY Bilateral 2012   cataract/bilateral vitrectomies   GIVENS CAPSULE STUDY N/A 08/20/2021   Procedure: GIVENS CAPSULE STUDY;  Surgeon: Midge Minium, MD;  Location: [facility];  Service: Endoscopy;  Laterality: N/A;   LEFT HEART CATH AND CORONARY ANGIOGRAPHY Left 04/25/2018   Procedure: LEFT HEART CATH AND CORONARY ANGIOGRAPHY;  Surgeon: Iran Ouch, MD;  Location: [facility];  Service: Cardiovascular;  Laterality: Left;   LEFT HEART CATH AND CORONARY ANGIOGRAPHY Left 02/02/2022   Procedure: LEFT HEART CATH AND CORONARY ANGIOGRAPHY;  Surgeon: Iran Ouch, MD;  Location: [facility];  Service: Cardiovascular;  Laterality: Left;   TEE WITHOUT CARDIOVERSION N/A 09/05/2018   Procedure: TRANSESOPHAGEAL ECHOCARDIOGRAM (TEE);  Surgeon: Iran Ouch, MD;  Location: [facility];  Service: Cardiovascular;  Laterality: N/A;   TONSILLECTOMY AND ADENOIDECTOMY     As a child   TUNNELED VENOUS CATHETER PLACEMENT     removed    Family History  Problem Relation Age of Onset   Diabetes Mother    Heart disease Mother    Cancer Father        Prostate CA, Anal cancer    Dementia Father    Diabetes Brother    Healthy Brother    Healthy Brother     Social History   Tobacco Use   Smoking status: Never   Smokeless tobacco: Never   Tobacco comments:    smoking cessation materials not required  Substance Use Topics   Alcohol use: Not Currently    Alcohol/week: 0.0 standard drinks of alcohol     Current Outpatient Medications:    albuterol (PROVENTIL) (2.5 MG/3ML) 0.083% nebulizer solution, Take 3 mLs  (2.5 mg total) by nebulization every 6 (six) hours as needed for wheezing or shortness of breath., Disp: 75 mL, Rfl: 2   albuterol (VENTOLIN HFA) 108 (90 Base) MCG/ACT inhaler, Inhale 2 puffs into the lungs daily., Disp: , Rfl:    ammonium lactate (AMLACTIN) 12 % lotion, Apply 1 application topically as needed for dry skin., Disp: 400 g, Rfl: 3   aspirin EC 81 MG tablet, Take 1 tablet (81 mg total) by mouth daily., Disp: 90 tablet, Rfl: 3   clopidogrel (PLAVIX) 75 MG tablet, TAKE 1 TABLET BY MOUTH DAILY WITH BREAKFAST, Disp: 90 tablet, Rfl: 0   cyclobenzaprine (FLEXERIL) 10 MG tablet, TAKE 1 TABLET(10 MG) BY MOUTH TWICE DAILY AS NEEDED, Disp: 30 tablet, Rfl: 3   dexlansoprazole (DEXILANT) 60 MG capsule, TAKE 1 CAPSULE(60 MG) BY MOUTH DAILY, Disp: 30 capsule, Rfl: 0   DULoxetine (CYMBALTA) 30 MG capsule, Take 1 capsule (30 mg total) by mouth daily. With one 60 mg capsules, Disp: 90 capsule, Rfl: 1   DULoxetine (CYMBALTA) 60 MG capsule, TAKE 1 CAPSULE(60 MG) BY MOUTH DAILY, Disp: 90 capsule, Rfl: 1   empagliflozin (JARDIANCE) 10 MG TABS tablet, TAKE 1 TABLET(10 MG) BY MOUTH DAILY BEFORE BREAKFAST, Disp: 90 tablet, Rfl: 3   ezetimibe (ZETIA) 10 MG tablet, TAKE 1 TABLET(10 MG) BY MOUTH DAILY, Disp: 90 tablet, Rfl: 0   fluticasone furoate-vilanterol (BREO ELLIPTA) 200-25 MCG/ACT AEPB, Inhale 1 puff into the lungs daily., Disp: , Rfl:    furosemide (LASIX) 40 MG tablet, Take 1 tablet (40 mg total) by mouth daily., Disp: 90 tablet, Rfl: 0   gabapentin (NEURONTIN) 300 MG capsule, Take 1 capsule (300 mg total) by mouth 3 (three) times daily., Disp: 90 capsule, Rfl: 1   gabapentin (NEURONTIN) 800 MG tablet, Take 800 mg by mouth at bedtime., Disp: , Rfl:    Humidifier/Vaporizer Supplies (VICKS VAPORIZER SCENT) PADS, Apply 1 application. topically daily as needed (cough)., Disp: , Rfl:    insulin lispro (HUMALOG) 100 UNIT/ML KiwkPen, Inject 6-10 Units into the skin 3 (three) times daily.  sliding scale 1 unit  for every 10 units of carbs, Disp: , Rfl:    Insulin Pen Needle 32G X 4 MM MISC, Inject 1 each into the skin as needed., Disp: , Rfl:    LANTUS SOLOSTAR 100 UNIT/ML Solostar Pen, Inject 20-23 Units into the skin daily at 10 pm. Sliding scale based on blood glucose, Disp: , Rfl: 0   levocetirizine (XYZAL) 5 MG tablet, TAKE 1 TABLET(5 MG) BY MOUTH EVERY EVENING, Disp: 90 tablet, Rfl: 1   levothyroxine (  SYNTHROID) 50 MCG tablet, TAKE 1 TABLET BY MOUTH DAILY MONDAY THROUGH SATURDAY, 2 TABLETS DAILY ON SUNDAY, Disp: 102 tablet, Rfl: 0   loratadine (CLARITIN) 10 MG tablet, Take 10 mg by mouth in the morning., Disp: , Rfl:    metFORMIN (GLUCOPHAGE) 1000 MG tablet, Take 1,000 mg by mouth 2 (two) times daily with a meal., Disp: , Rfl:    metoprolol succinate (TOPROL-XL) 25 MG 24 hr tablet, Take 1 tablet (25 mg total) by mouth daily., Disp: 90 tablet, Rfl: 1   montelukast (SINGULAIR) 10 MG tablet, Take 10 mg by mouth at bedtime., Disp: , Rfl:    Multiple Vitamins-Minerals (MENS MULTI VITAMIN & MINERAL PO), Take 1 tablet by mouth in the morning and at bedtime., Disp: , Rfl:    naloxone (NARCAN) nasal spray 4 mg/0.1 mL, For excess sedation from opioids, Disp: 1 kit, Rfl: 2   nitroGLYCERIN (NITROSTAT) 0.3 MG SL tablet, DISSOLVE 1 TABLET UNDER THE TONGUE EVERY 5 MINUTES AS NEEDED FOR CHEST PAIN. MAXIMUM 3 DOSES, Disp: 25 tablet, Rfl: 0   Oxycodone HCl 10 MG TABS, Take 1 tablet (10 mg total) by mouth in the morning, at noon, in the evening, and at bedtime., Disp: 120 tablet, Rfl: 0   [START ON 02/22/2023] Oxycodone HCl 10 MG TABS, Take 1 tablet (10 mg total) by mouth in the morning, at noon, in the evening, and at bedtime., Disp: 120 tablet, Rfl: 0   rosuvastatin (CRESTOR) 5 MG tablet, Take 5 mg by mouth daily with supper., Disp: , Rfl: 0   Semaglutide, 1 MG/DOSE, 2 MG/1.5ML SOPN, Inject 1 mg into the skin daily at 12 noon., Disp: , Rfl:    Tiotropium Bromide Monohydrate (SPIRIVA RESPIMAT) 1.25 MCG/ACT AERS, INHALE  2 PUFFS INTO THE LUNGS DAILY, Disp: 12 g, Rfl: 0   benzonatate (TESSALON) 100 MG capsule, Take 1 capsule (100 mg total) by mouth 2 (two) times daily as needed for cough. (Patient not taking: Reported on 02/16/2023), Disp: 20 capsule, Rfl: 0  Current Facility-Administered Medications:    sodium chloride flush (NS) 0.9 % injection 3 mL, 3 mL, Intravenous, Q12H, Furth, Cadence H, PA-C  Allergies  Allergen Reactions   Azathioprine Other (See Comments)    Azathioprine hypersensitivity reaction - Symptoms mimicking sepsis - was hospitalized   Novolog [Insulin Aspart] Hives    I personally reviewed active problem list, medication list, allergies, family history, social history, health maintenance with the patient/caregiver today.   ROS  Ten systems reviewed and is negative except as mentioned in HPI   Objective  Vitals:   02/16/23 1118  BP: 120/68  Pulse: 90  Resp: 16  SpO2: 95%  Weight: 197 lb (89.4 kg)  Height: 5\' 7"  (1.702 m)    Body mass index is 30.85 kg/m.  Physical Exam  Constitutional: Patient appears well-developed and well-nourished. Obese  No distress.  HEENT: head atraumatic, normocephalic, pupils equal and reactive to light, neck supple Cardiovascular: Normal rate, regular rhythm and normal heart sounds.  No murmur heard. No BLE edema. Pulmonary/Chest: Effort normal , bilateral in and expiratory wheezing, no crackles, good air movement No respiratory distress. Abdominal: Soft.  There is no tenderness. Psychiatric: Patient has a normal mood and affect. behavior is normal. Judgment and thought content normal.   Recent Results (from the past 2160 hour(s))  Basic metabolic panel     Status: Abnormal   Collection Time: 11/25/22 11:52 AM  Result Value Ref Range   Sodium 137 135 - 145 mmol/L  Potassium 3.8 3.5 - 5.1 mmol/L   Chloride 101 98 - 111 mmol/L   CO2 29 22 - 32 mmol/L   Glucose, Bld 61 (L) 70 - 99 mg/dL    Comment: Glucose reference range applies only to  samples taken after fasting for at least 8 hours.   BUN 25 (H) 8 - 23 mg/dL   Creatinine, Ser 4.03 (H) 0.61 - 1.24 mg/dL   Calcium 8.9 8.9 - 47.4 mg/dL   GFR, Estimated >25 >95 mL/min    Comment: (NOTE) Calculated using the CKD-EPI Creatinine Equation (2021)    Anion gap 7 5 - 15    Comment: Performed at [facility], [address] [place], [place] [address]  Novel Coronavirus, NAA ([facility])     Status: None   Collection Time: 12/16/22 12:00 AM   Specimen: Nasopharyngeal(NP) swabs in vial transport medium   Nasopharynge  Previous  Result Value Ref Range   SARS-CoV-2, NAA Not Detected Not Detected    Comment: This nucleic acid amplification test was developed and its performance characteristics determined by [employer]. Nucleic acid amplification tests include RT-PCR and TMA. This test has not been FDA cleared or approved. This test has been authorized by FDA under an Emergency Use Authorization (EUA). This test is only authorized for the duration of time the declaration that circumstances exist justifying the authorization of the emergency use of in vitro diagnostic tests for detection of SARS-CoV-2 virus and/or diagnosis of COVID-19 infection under section 564(b)(1) of the Act, 21 U.S.C. 643PIR-5(J) (1), unless the authorization is terminated or revoked sooner. When diagnostic testing is negative, the possibility of a false negative result should be considered in the context of a patient's recent exposures and the presence of clinical signs and symptoms consistent with COVID-19. An individual without symptoms of COVID-19 and who is not shedding SARS-CoV-2 virus wo uld expect to have a negative (not detected) result in this assay.   Specimen status report     Status: None   Collection Time: 12/16/22 12:00 AM  Result Value Ref Range   specimen status report Comment     Comment: Please note Please note The date and/or time of collection was not indicated on  the requisition as required by state and federal law.  The date of receipt of the specimen was used as the collection date if not supplied.   POCT Influenza A/B     Status: None   Collection Time: 12/16/22 11:25 AM  Result Value Ref Range   Influenza A, POC Negative Negative   Influenza B, POC Negative Negative  ECHOCARDIOGRAM COMPLETE     Status: None   Collection Time: 01/06/23  4:35 PM  Result Value Ref Range   AR max vel 3.46 cm2   AV Peak grad 3.8 mmHg   Ao pk vel 0.97 m/s   S' Lateral 2.10 cm   AV Area VTI 3.57 cm2   AV Mean grad 2.0 mmHg   Single Plane A4C EF 59.7 %   Single Plane A2C EF 61.7 %   Calc EF 59.4 %   AV Area mean vel 3.52 cm2   Est EF 40 - 45%       PHQ2/9:    02/16/2023   11:17 AM 12/16/2022   10:53 AM 11/30/2022    2:05 PM 10/20/2022   11:25 AM 08/17/2022   12:59 PM  Depression screen PHQ 2/9  Decreased Interest 1 0 0 0 0  Down, Depressed, Hopeless 2 0 0  1 0  PHQ - 2 Score 3 0 0 1 0  Altered sleeping 0 0  0   Tired, decreased energy 2 0  3   Change in appetite 0 0  0   Feeling bad or failure about yourself  0 0  0   Trouble concentrating 0 0  0   Moving slowly or fidgety/restless 0 0  0   Suicidal thoughts 0 0  0   PHQ-9 Score 5 0  4   Difficult doing work/chores  Not difficult at all       phq 9 is positive   Fall Risk:    02/16/2023   11:17 AM 12/16/2022   10:53 AM 11/30/2022    2:05 PM 10/20/2022   11:25 AM 08/17/2022   12:59 PM  Fall Risk   Falls in the past year? 0 0 0 1 0  Number falls in past yr: 0 0 0 0   Injury with Fall? 0 0 0 0   Risk for fall due to : Impaired balance/gait  No Fall Risks Impaired balance/gait   Follow up Falls prevention discussed  Falls prevention discussed Falls prevention discussed       Functional Status Survey: Is the patient deaf or have difficulty hearing?: Yes Does the patient have difficulty seeing, even when wearing glasses/contacts?: Yes Does the patient have difficulty concentrating,  remembering, or making decisions?: No Does the patient have difficulty walking or climbing stairs?: Yes Does the patient have difficulty dressing or bathing?: No Does the patient have difficulty doing errands alone such as visiting a doctor's office or shopping?: Yes    Assessment & Plan  1. Type 2 diabetes mellitus with diabetic polyneuropathy, with long-term current use of insulin (HCC)  - Urine Microalbumin w/creat. ratio  2. Moderate episode of recurrent major depressive disorder (HCC)  - DULoxetine (CYMBALTA) 60 MG capsule; Take 1 capsule (60 mg total) by mouth daily.  Dispense: 90 capsule; Refill: 1  Taking a total of 90 mg daily   3. Atherosclerosis of aorta (HCC)  On statin therapy   4. HFrEF (heart failure with reduced ejection fraction) (HCC)  Sees Dr. Kirke Corin  5. Inflammatory spondylopathy of lumbosacral region [facility])  Under the care of pain clinic - Dr. Pernell Dupre  6. Unstable angina (HCC)  Under the care of cardiologist and taking medications as prescribed  7. Myasthenia gravis (HCC)  Stable   8. Constipation due to opioid therapy  - lubiprostone (AMITIZA) 8 MCG capsule; Take 1 capsule (8 mcg total) by mouth 2 (two) times daily with a meal.  Dispense: 180 capsule; Refill: 0  9. OSA on CPAP   10. Moderate persistent asthma with acute exacerbation  - predniSONE (DELTASONE) 20 MG tablet; Take 1 tablet (20 mg total) by mouth 2 (two) times daily with a meal.  Dispense: 10 tablet; Refill: 0

## 2023-02-16 ENCOUNTER — Ambulatory Visit (INDEPENDENT_AMBULATORY_CARE_PROVIDER_SITE_OTHER): Payer: Medicare Other | Admitting: Family Medicine

## 2023-02-16 VITALS — BP 120/68 | HR 90 | Resp 16 | Ht 67.0 in | Wt 197.0 lb

## 2023-02-16 DIAGNOSIS — K5903 Drug induced constipation: Secondary | ICD-10-CM

## 2023-02-16 DIAGNOSIS — I700 Atherosclerosis of aorta: Secondary | ICD-10-CM | POA: Diagnosis not present

## 2023-02-16 DIAGNOSIS — F331 Major depressive disorder, recurrent, moderate: Secondary | ICD-10-CM

## 2023-02-16 DIAGNOSIS — I200 Unstable angina: Secondary | ICD-10-CM

## 2023-02-16 DIAGNOSIS — J4541 Moderate persistent asthma with (acute) exacerbation: Secondary | ICD-10-CM

## 2023-02-16 DIAGNOSIS — G4733 Obstructive sleep apnea (adult) (pediatric): Secondary | ICD-10-CM

## 2023-02-16 DIAGNOSIS — Z794 Long term (current) use of insulin: Secondary | ICD-10-CM

## 2023-02-16 DIAGNOSIS — I5020 Unspecified systolic (congestive) heart failure: Secondary | ICD-10-CM | POA: Diagnosis not present

## 2023-02-16 DIAGNOSIS — M4697 Unspecified inflammatory spondylopathy, lumbosacral region: Secondary | ICD-10-CM

## 2023-02-16 DIAGNOSIS — G7000 Myasthenia gravis without (acute) exacerbation: Secondary | ICD-10-CM

## 2023-02-16 DIAGNOSIS — E1142 Type 2 diabetes mellitus with diabetic polyneuropathy: Secondary | ICD-10-CM | POA: Diagnosis not present

## 2023-02-16 DIAGNOSIS — T402X5A Adverse effect of other opioids, initial encounter: Secondary | ICD-10-CM

## 2023-02-16 MED ORDER — PREDNISONE 20 MG PO TABS
20.0000 mg | ORAL_TABLET | Freq: Two times a day (BID) | ORAL | 0 refills | Status: DC
Start: 2023-02-16 — End: 2023-03-22

## 2023-02-16 MED ORDER — DULOXETINE HCL 60 MG PO CPEP: 60.0000 mg | ORAL_CAPSULE | Freq: Every day | ORAL | 1 refills | Status: DC

## 2023-02-16 MED ORDER — LUBIPROSTONE 8 MCG PO CAPS
8.0000 ug | ORAL_CAPSULE | Freq: Two times a day (BID) | ORAL | 0 refills | Status: DC
Start: 2023-02-16 — End: 2023-05-17

## 2023-02-16 NOTE — Patient Instructions (Signed)
Get Tdap and shingrix at local pharmacy

## 2023-02-17 ENCOUNTER — Other Ambulatory Visit: Payer: Self-pay | Admitting: Family Medicine

## 2023-02-17 DIAGNOSIS — E063 Autoimmune thyroiditis: Secondary | ICD-10-CM

## 2023-02-17 LAB — MICROALBUMIN / CREATININE URINE RATIO
Creatinine, Urine: 53 mg/dL (ref 20–320)
Microalb Creat Ratio: 4 mg/g creat (ref <30)
Microalb, Ur: 0.2 mg/dL

## 2023-02-19 ENCOUNTER — Ambulatory Visit: Payer: Commercial Managed Care - HMO | Admitting: Podiatry

## 2023-03-02 ENCOUNTER — Other Ambulatory Visit: Payer: Self-pay | Admitting: Cardiovascular Disease

## 2023-03-04 ENCOUNTER — Ambulatory Visit: Payer: Medicare Other | Admitting: Cardiovascular Disease

## 2023-03-06 ENCOUNTER — Other Ambulatory Visit: Payer: Self-pay | Admitting: Nurse Practitioner

## 2023-03-12 ENCOUNTER — Encounter: Payer: Self-pay | Admitting: Oncology

## 2023-03-16 ENCOUNTER — Telehealth: Payer: Self-pay | Admitting: Family Medicine

## 2023-03-16 NOTE — Telephone Encounter (Signed)
Pts son, [name], who is on [place] ,came by the office with concerns about pts behavior. Son states that pt was aggressive today, to the point the law had to be called. [name] was advised to schedule an appt to see PCP for poss early dementia due to pts sudden outbusts. Pt has an appt on Thursday to see Dr Carlynn Purl. Matt wanted to leave his phone # in case CMA's or Dr Carlynn Purl needed to contact him. Son states that pt would not come to this appt if he knows what its about. Please contact son with any concerns or questions @ [phone]

## 2023-03-18 ENCOUNTER — Ambulatory Visit: Payer: Medicare Other | Admitting: Family Medicine

## 2023-03-19 ENCOUNTER — Encounter: Payer: Self-pay | Admitting: Podiatry

## 2023-03-19 ENCOUNTER — Ambulatory Visit: Payer: Medicare Other | Admitting: Podiatry

## 2023-03-19 VITALS — BP 120/70

## 2023-03-19 DIAGNOSIS — M79674 Pain in right toe(s): Secondary | ICD-10-CM

## 2023-03-19 DIAGNOSIS — M79675 Pain in left toe(s): Secondary | ICD-10-CM | POA: Diagnosis not present

## 2023-03-19 DIAGNOSIS — B351 Tinea unguium: Secondary | ICD-10-CM | POA: Diagnosis not present

## 2023-03-19 DIAGNOSIS — E1142 Type 2 diabetes mellitus with diabetic polyneuropathy: Secondary | ICD-10-CM

## 2023-03-19 DIAGNOSIS — L84 Corns and callosities: Secondary | ICD-10-CM
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
## 2023-03-22 ENCOUNTER — Ambulatory Visit (INDEPENDENT_AMBULATORY_CARE_PROVIDER_SITE_OTHER): Payer: Medicare Other | Admitting: Family Medicine

## 2023-03-22 ENCOUNTER — Encounter: Payer: Self-pay | Admitting: Family Medicine

## 2023-03-22 VITALS — BP 118/76 | HR 89 | Temp 97.8°F | Resp 14 | Ht 67.0 in | Wt 201.9 lb

## 2023-03-22 DIAGNOSIS — R21 Rash and other nonspecific skin eruption: Secondary | ICD-10-CM | POA: Diagnosis not present

## 2023-03-22 DIAGNOSIS — G609 Hereditary and idiopathic neuropathy, unspecified: Secondary | ICD-10-CM

## 2023-03-22 DIAGNOSIS — Z638 Other specified problems related to primary support group: Secondary | ICD-10-CM | POA: Diagnosis not present

## 2023-03-22 DIAGNOSIS — G7000 Myasthenia gravis without (acute) exacerbation: Secondary | ICD-10-CM | POA: Diagnosis not present

## 2023-03-22 MED ORDER — KETOCONAZOLE 2 % EX CREA
1.0000 | TOPICAL_CREAM | Freq: Every day | CUTANEOUS | 0 refills | Status: DC
Start: 2023-03-22 — End: 2024-05-16

## 2023-03-22 NOTE — Progress Notes (Signed)
  Subjective:  Patient ID: [name], male    DOB: 09-21-1961,  MRN: [id number]  KOH AMOR presents to clinic today for at risk foot care with history of diabetic neuropathy and thick, elongated toenails bilateral great toes which are tender when wearing enclosed shoe gear. Chief Complaint  Patient presents with   Nail Problem    DFC,Referring Provider [name], MD,LOV:2 weeks ago,A1C:7.5,BS:124      Callouses    Right ball of foot, patient was unaware it was there,no pain    New problem(s): None.   PCP is [name], MD.  Allergies  Allergen Reactions   Azathioprine Other (See Comments)    Azathioprine hypersensitivity reaction - Symptoms mimicking sepsis - was hospitalized   Novolog [Insulin Aspart] Hives   Review of Systems: Negative except as noted in the HPI. Objective:   Vitals:   03/19/23 1022  BP: 120/70   Constitutional [name] is a pleasant 62 y.o. male, in NAD. AAO x 3.   Vascular Vascular Examination: Capillary refill time immediate b/l. Vascular status intact b/l with palpable pedal pulses. Pedal hair sparse. No edema. No pain with calf compression b/l. Skin temperature gradient WNL b/l. No cyanosis or clubbing b/l. No ischemia or gangrene noted b/l LE.  Neurological Examination: Pt has subjective symptoms of neuropathy.  Dermatological Examination: Pedal skin with normal turgor, texture and tone b/l.  No open wounds. No interdigital macerations.   Toenails great toes b/l thick, discolored, elongated with subungual debris and pain on dorsal palpation. Nondystrophic toenails 2-5 bilaterally.   Hyperkeratotic lesion(s) submet head 5 right foot.  No erythema, no edema, no drainage, no fluctuance.  Musculoskeletal Examination: Muscle strength 5/5 to all lower extremity muscle groups bilaterally. Limited joint ROM to the 1st met cuneiform joint b/l. C-shaped foot RLE.  Radiographs: None  Last A1c:      10/21/2022   12:00  AM 06/24/2022   12:00 AM 03/24/2022   12:00 AM  Hemoglobin A1C  Hemoglobin-A1c 7.5     7.7     7.8         This result is from an external source.       Assessment:   1. Pain due to onychomycosis of toenails of both feet   2. Callus   3. Diabetic peripheral neuropathy (HCC)    Plan:  Patient was evaluated and treated and all questions answered. Consent given for treatment as described below: -Recommend patient see Dr. Allena Katz for evaluation for custom orthotics for foot deformity as patient has h/o surgery left foot. -Continue foot and shoe inspections daily. Monitor blood glucose per PCP/Endocrinologist's recommendations. -Patient to continue soft, supportive shoe gear daily. -Mycotic toenails bilateral great toes were debrided in length and girth with sterile nail nippers and dremel without iatrogenic bleeding. -Patient/POA to call should there be question/concern in the interim.  Return in about 3 months (around 06/19/2023).  Freddie Breech, DPM

## 2023-03-24 ENCOUNTER — Encounter: Payer: Self-pay | Admitting: Anesthesiology

## 2023-03-24 ENCOUNTER — Ambulatory Visit (HOSPITAL_BASED_OUTPATIENT_CLINIC_OR_DEPARTMENT_OTHER): Payer: Medicare Other | Admitting: Anesthesiology

## 2023-03-24 ENCOUNTER — Other Ambulatory Visit: Payer: Self-pay | Admitting: Anesthesiology

## 2023-03-24 ENCOUNTER — Ambulatory Visit
Admission: RE | Admit: 2023-03-24 | Discharge: 2023-03-24 | Disposition: A | Discharge status: Home / Self Care | Payer: Medicare Other | Source: Ambulatory Visit | Attending: Anesthesiology | Admitting: Anesthesiology

## 2023-03-24 VITALS — BP 135/91 | HR 85 | Temp 98.4°F | Resp 10 | Ht 67.0 in | Wt 201.0 lb

## 2023-03-24 DIAGNOSIS — M5432 Sciatica, left side: Secondary | ICD-10-CM

## 2023-03-24 DIAGNOSIS — M5136 Other intervertebral disc degeneration, lumbar region: Secondary | ICD-10-CM | POA: Insufficient documentation

## 2023-03-24 DIAGNOSIS — M542 Cervicalgia: Secondary | ICD-10-CM | POA: Diagnosis present

## 2023-03-24 DIAGNOSIS — M47817 Spondylosis without myelopathy or radiculopathy, lumbosacral region: Secondary | ICD-10-CM | POA: Diagnosis present

## 2023-03-24 DIAGNOSIS — M4716 Other spondylosis with myelopathy, lumbar region: Secondary | ICD-10-CM | POA: Diagnosis not present

## 2023-03-24 DIAGNOSIS — G7000 Myasthenia gravis without (acute) exacerbation: Secondary | ICD-10-CM | POA: Insufficient documentation

## 2023-03-24 DIAGNOSIS — G894 Chronic pain syndrome: Secondary | ICD-10-CM

## 2023-03-24 DIAGNOSIS — M545 Low back pain, unspecified: Secondary | ICD-10-CM | POA: Diagnosis present

## 2023-03-24 DIAGNOSIS — F1190 Opioid use, unspecified, uncomplicated: Secondary | ICD-10-CM | POA: Insufficient documentation

## 2023-03-24 DIAGNOSIS — R29898 Other symptoms and signs involving the musculoskeletal system: Secondary | ICD-10-CM | POA: Insufficient documentation

## 2023-03-24 DIAGNOSIS — A6923 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: Secondary | ICD-10-CM | POA: Insufficient documentation

## 2023-03-24 MED ORDER — SODIUM CHLORIDE (PF) 0.9 % IJ SOLN
INTRAMUSCULAR | Status: AC
  Filled 2023-03-24: qty 10

## 2023-03-24 MED ORDER — TRIAMCINOLONE ACETONIDE 40 MG/ML IJ SUSP
40.0000 mg | Freq: Once | INTRAMUSCULAR | Status: AC
  Administered 2023-03-25: 40 mg

## 2023-03-24 MED ORDER — LIDOCAINE HCL (PF) 2 % IJ SOLN
INTRAMUSCULAR | Status: AC
  Filled 2023-03-24: qty 5

## 2023-03-24 MED ORDER — SODIUM CHLORIDE 0.9% FLUSH
10.0000 mL | Freq: Once | INTRAVENOUS | Status: AC
  Administered 2023-03-25: 10 mL

## 2023-03-24 MED ORDER — IOHEXOL 180 MG/ML  SOLN
INTRAMUSCULAR | Status: AC
  Filled 2023-03-24: qty 20

## 2023-03-24 MED ORDER — TRIAMCINOLONE ACETONIDE 40 MG/ML IJ SUSP
INTRAMUSCULAR | Status: AC
  Filled 2023-03-24: qty 1

## 2023-03-24 MED ORDER — IOHEXOL 180 MG/ML  SOLN
10.0000 mL | Freq: Once | INTRAMUSCULAR | Status: AC | PRN
  Administered 2023-03-25: 10 mL via EPIDURAL

## 2023-03-24 MED ORDER — ROPIVACAINE HCL 2 MG/ML IJ SOLN
10.0000 mL | Freq: Once | INTRAMUSCULAR | Status: AC
  Administered 2023-03-25: 20 mL via EPIDURAL

## 2023-03-24 MED ORDER — ROPIVACAINE HCL 2 MG/ML IJ SOLN
INTRAMUSCULAR | Status: AC
  Filled 2023-03-24: qty 20

## 2023-03-24 MED ORDER — LIDOCAINE HCL (PF) 1 % IJ SOLN
5.0000 mL | Freq: Once | INTRAMUSCULAR | Status: AC
  Administered 2023-03-25: 5 mL via SUBCUTANEOUS

## 2023-03-24 NOTE — Progress Notes (Unsigned)
Nursing Pain Medication Assessment:  Safety precautions to be maintained throughout the outpatient stay will include: orient to surroundings, keep bed in low position, maintain call bell within reach at all times, provide assistance with transfer out of bed and ambulation.  Medication Inspection Compliance: Pill count conducted under aseptic conditions, in front of the patient. Neither the pills nor the bottle was removed from the patient's sight at any time. Once count was completed pills were immediately returned to the patient in their original bottle.  Medication: Oxycodone IR Pill/Patch Count:  12 of 120 pills remain Pill/Patch Appearance: Markings consistent with prescribed medication Bottle Appearance: Standard pharmacy container. Clearly labeled. Filled Date: 05 / 20 / 2024 Last Medication intake:  TodaySafety precautions to be maintained throughout the outpatient stay will include: orient to surroundings, keep bed in low position, maintain call bell within reach at all times, provide assistance with transfer out of bed and ambulation.

## 2023-03-24 NOTE — Patient Instructions (Signed)

## 2023-03-25 ENCOUNTER — Encounter: Payer: Self-pay | Admitting: Anesthesiology

## 2023-03-25 MED ORDER — OXYCODONE HCL 10 MG PO TABS: 10.0000 mg | ORAL_TABLET | Freq: Four times a day (QID) | ORAL | 0 refills | Status: DC

## 2023-03-25 MED ORDER — OXYCODONE HCL 10 MG PO TABS: 10.0000 mg | ORAL_TABLET | Freq: Four times a day (QID) | ORAL | 0 refills | Status: AC

## 2023-03-25 NOTE — Progress Notes (Signed)
Subjective:  Patient ID: ARJAY HIBBERD, male    DOB: Feb 09, 1961  Age: 62 y.o. MRN: [id number]  CC: Back Pain (lower)   Procedure: L5-S1 epidural steroid under fluoroscopic guidance without sedation  HPI CHRISTY ARQUILLA presents for reevaluation.  We last spoke about 2 months ago and Jorja Loa has had recurrence of his typical left side sciatica.  His last epidural injection was back in March and he did very well with this.  He generally goes about 3 months between injections on average.  His last injection gave him nearly 100% relief of his sciatica symptoms for about 2 months.  Back in mid May he started having recurrence of the same pain typical for what he is experienced in the past.  It is mainly a low back pain radiating into the left lateral leg down into the calf.  The pain persist despite conservative therapy with anti-inflammatories or stretching exercises.  Physical therapy has been ineffective but fortunately he does gain good relief with the epidural steroid injections.  He takes chronic opioid management for his baseline pain and other diffuse body pains that he is reported.  This enables him to continue to be active functional sleep better at night and do his daily household chores.  Otherwise he is in his usual state of health.  No change in lower extremity strength function bowel or bladder function is noted.  No side effects with the medications as noted.  Outpatient Medications Prior to Visit  Medication Sig Dispense Refill   albuterol (PROVENTIL) (2.5 MG/3ML) 0.083% nebulizer solution Take 3 mLs (2.5 mg total) by nebulization every 6 (six) hours as needed for wheezing or shortness of breath. 75 mL 2   albuterol (VENTOLIN HFA) 108 (90 Base) MCG/ACT inhaler Inhale 2 puffs into the lungs daily.     ammonium lactate (AMLACTIN) 12 % lotion Apply 1 application topically as needed for dry skin. 400 g 3   aspirin EC 81 MG tablet Take 1 tablet (81 mg total) by mouth daily. 90 tablet 3    clopidogrel (PLAVIX) 75 MG tablet TAKE 1 TABLET BY MOUTH DAILY WITH BREAKFAST 90 tablet 0   cyclobenzaprine (FLEXERIL) 10 MG tablet TAKE 1 TABLET(10 MG) BY MOUTH TWICE DAILY AS NEEDED 30 tablet 3   dexlansoprazole (DEXILANT) 60 MG capsule TAKE 1 CAPSULE(60 MG) BY MOUTH DAILY 30 capsule 0   DULoxetine (CYMBALTA) 30 MG capsule Take 1 capsule (30 mg total) by mouth daily. With one 60 mg capsules 90 capsule 1   DULoxetine (CYMBALTA) 60 MG capsule Take 1 capsule (60 mg total) by mouth daily. 90 capsule 1   empagliflozin (JARDIANCE) 10 MG TABS tablet TAKE 1 TABLET(10 MG) BY MOUTH DAILY BEFORE BREAKFAST 90 tablet 3   ezetimibe (ZETIA) 10 MG tablet TAKE 1 TABLET(10 MG) BY MOUTH DAILY 90 tablet 0   fluticasone furoate-vilanterol (BREO ELLIPTA) 200-25 MCG/ACT AEPB Inhale 1 puff into the lungs daily.     gabapentin (NEURONTIN) 300 MG capsule Take 1 capsule (300 mg total) by mouth 3 (three) times daily. 90 capsule 1   gabapentin (NEURONTIN) 800 MG tablet Take 800 mg by mouth at bedtime.     Humidifier/Vaporizer Supplies (VICKS VAPORIZER SCENT) PADS Apply 1 application. topically daily as needed (cough).     insulin lispro (HUMALOG) 100 UNIT/ML KiwkPen Inject 6-10 Units into the skin 3 (three) times daily.  sliding scale 1 unit for every 10 units of carbs     Insulin Pen Needle 32G X 4 MM  MISC Inject 1 each into the skin as needed.     ketoconazole (NIZORAL) 2 % cream Apply 1 Application topically daily. 60 g 0   LANTUS SOLOSTAR 100 UNIT/ML Solostar Pen Inject 20-23 Units into the skin daily at 10 pm. Sliding scale based on blood glucose  0   levocetirizine (XYZAL) 5 MG tablet TAKE 1 TABLET(5 MG) BY MOUTH EVERY EVENING 90 tablet 1   levothyroxine (SYNTHROID) 50 MCG tablet TAKE 1 TABLET BY MOUTH DAILY MONDAY THROUGH SATURDAY, 2 TABLETS DAILY ON SUNDAY 102 tablet 0   loratadine (CLARITIN) 10 MG tablet Take 10 mg by mouth in the morning.     lubiprostone (AMITIZA) 8 MCG capsule Take 1 capsule (8 mcg total) by  mouth 2 (two) times daily with a meal. 180 capsule 0   metFORMIN (GLUCOPHAGE) 1000 MG tablet Take 1,000 mg by mouth 2 (two) times daily with a meal.     metoprolol succinate (TOPROL-XL) 25 MG 24 hr tablet Take 1 tablet (25 mg total) by mouth daily. 90 tablet 1   montelukast (SINGULAIR) 10 MG tablet Take 10 mg by mouth at bedtime.     Multiple Vitamins-Minerals (MENS MULTI VITAMIN & MINERAL PO) Take 1 tablet by mouth in the morning and at bedtime.     naloxone (NARCAN) nasal spray 4 mg/0.1 mL For excess sedation from opioids 1 kit 2   nitroGLYCERIN (NITROSTAT) 0.3 MG SL tablet DISSOLVE 1 TABLET UNDER THE TONGUE EVERY 5 MINUTES AS NEEDED FOR CHEST PAIN, MAX 3 DOSES 25 tablet 0   Oxycodone HCl 10 MG TABS Take 1 tablet (10 mg total) by mouth in the morning, at noon, in the evening, and at bedtime. 120 tablet 0   rosuvastatin (CRESTOR) 5 MG tablet Take 5 mg by mouth daily with supper.  0   Semaglutide, 1 MG/DOSE, 2 MG/1.5ML SOPN Inject 1 mg into the skin daily at 12 noon.     Tiotropium Bromide Monohydrate (SPIRIVA RESPIMAT) 1.25 MCG/ACT AERS INHALE 2 PUFFS INTO THE LUNGS DAILY 12 g 0   furosemide (LASIX) 40 MG tablet Take 1 tablet (40 mg total) by mouth daily. 90 tablet 0   Facility-Administered Medications Prior to Visit  Medication Dose Route Frequency Provider Last Rate Last Admin   sodium chloride flush (NS) 0.9 % injection 3 mL  3 mL Intravenous Q12H Furth, Cadence H, PA-C        Review of Systems CNS: No confusion or sedation Cardiac: No angina or palpitations GI: No abdominal pain or constipation Constitutional: No nausea vomiting fevers or chills  Objective:  BP (!) 135/91   Pulse 85   Temp 98.4 F (36.9 C) (Temporal)   Resp 10   Ht 5\' 7"  (1.702 m)   Wt 201 lb (91.2 kg)   SpO2 98%   BMI 31.48 kg/m    BP Readings from Last 3 Encounters:  03/24/23 (!) 135/91  03/22/23 118/76  03/19/23 120/70     Wt Readings from Last 3 Encounters:  03/24/23 201 lb (91.2 kg)  03/22/23  201 lb 14.4 oz (91.6 kg)  02/16/23 197 lb (89.4 kg)     Physical Exam Pt is alert and oriented PERRL EOMI HEART IS RRR no murmur or rub LCTA no wheezing or rales MUSCULOSKELETAL reveals some paraspinous muscle tenderness in the lumbar region but no overt trigger points.  Muscle tone and bulk is at baseline.  Gait is at baseline.  He does have a positive straight leg raise on the left side  negative on the right.  Some mild pain with extension at the low back left and right lateral rotation is noted.  Labs  Lab Results  Component Value Date   HGBA1C 7.5 10/21/2022   HGBA1C 7.7 06/24/2022   HGBA1C 7.8 03/24/2022   Lab Results  Component Value Date   MICROALBUR 0.2 02/16/2023   LDLCALC 40 06/24/2022   CREATININE 1.26 (H) 11/25/2022    -------------------------------------------------------------------------------------------------------------------- Lab Results  Component Value Date   WBC 5.3 06/05/2022   HGB 13.8 06/05/2022   HCT 42.5 06/05/2022   PLT 155 06/05/2022   GLUCOSE 61 (L) 11/25/2022   CHOL 106 06/24/2022   TRIG 133 06/24/2022   HDL 39 06/24/2022   LDLDIRECT 36 07/17/2021   LDLCALC 40 06/24/2022   ALT 14 06/24/2022   AST 16 06/24/2022   NA 137 11/25/2022   K 3.8 11/25/2022   CL 101 11/25/2022   CREATININE 1.26 (H) 11/25/2022   BUN 25 (H) 11/25/2022   CO2 29 11/25/2022   TSH 0.49 06/24/2022   PSA 0.56 09/23/2021   INR 1.26 07/07/2018   HGBA1C 7.5 10/21/2022   MICROALBUR 0.2 02/16/2023    --------------------------------------------------------------------------------------------------------------------- DG PAIN CLINIC C-ARM 1-60 MIN NO REPORT  Result Date: 03/24/2023 Fluoro was used, but no Radiologist interpretation will be provided. Please refer to "NOTES" tab for provider progress note.    Assessment & Plan:   Shayne was seen today for back pain.  Diagnoses and all orders for this visit:  DDD (degenerative disc disease), lumbar -      Lumbar Epidural Injection  Sciatica, left side -     Lumbar Epidural Injection  Other orders -     triamcinolone acetonide (KENALOG-40) injection 40 mg -     sodium chloride flush (NS) 0.9 % injection 10 mL -     ropivacaine (PF) 2 mg/mL (0.2%) (NAROPIN) injection 10 mL -     lidocaine (PF) (XYLOCAINE) 1 % injection 5 mL -     iohexol (OMNIPAQUE) 180 MG/ML injection 10 mL        ----------------------------------------------------------------------------------------------------------------------  Problem List Items Addressed This Visit   None Visit Diagnoses     DDD (degenerative disc disease), lumbar       Relevant Medications   triamcinolone acetonide (KENALOG-40) injection 40 mg (Completed)   Sciatica, left side             ----------------------------------------------------------------------------------------------------------------------  1. DDD (degenerative disc disease), lumbar Continue with current core strengthening as tolerated.  Will proceed with an epidural per patient request today we have gone over the risks and benefits of the procedure with him in full detail all questions were answered.  We will schedule him for return to clinic in 1 month for reevaluation to see how he is responded to epidural steroid therapy. - Lumbar Epidural Injection  2. Sciatica, left side As above - Lumbar Epidural Injection  1. Lumbar spondylosis with myelopathy   2. DDD (degenerative disc disease), lumbar   3. Sciatica, left side   4. Chronic, continuous use of opioids   5. Myasthenia gravis (HCC)   6. Chronic pain syndrome   7. 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 Left leg weakness   10. Cervicalgia   11. Low back pain at multiple sites   12. Left arm weakness   Furthermore, based on our review today and upon review of the [facility] practitioner database information I think it is  appropriate to keep him on  current medication therapy as he is responding favorably to chronic opioid therapy.  He has failed more conservative therapy.  The medications continue to enable him to stay active functional.  These will be refilled for the next 2 months.  This to be dated for 620 and 720.  Will work return to clinic in 1 month.  ----------------------------------------------------------------------------------------------------------------------  I am having Paxon Cavitt. Lung "[name]" maintain his insulin lispro, loratadine, gabapentin, metFORMIN, Lantus SoloStar, naloxone, rosuvastatin, Insulin Pen Needle, aspirin EC, gabapentin, Multiple Vitamins-Minerals (MENS MULTI VITAMIN & MINERAL PO), montelukast, albuterol, ammonium lactate, albuterol, Vicks Vaporizer Scent, fluticasone furoate-vilanterol, Semaglutide (1 MG/DOSE), dexlansoprazole, furosemide, empagliflozin, metoprolol succinate, Spiriva Respimat, cyclobenzaprine, DULoxetine, ezetimibe, levothyroxine, levocetirizine, DULoxetine, lubiprostone, clopidogrel, nitroGLYCERIN, and ketoconazole. We administered triamcinolone acetonide, sodium chloride flush, ropivacaine (PF) 2 mg/mL (0.2%), lidocaine (PF), and iohexol. We will continue to administer sodium chloride flush.   Meds ordered this encounter  Medications   triamcinolone acetonide (KENALOG-40) injection 40 mg   sodium chloride flush (NS) 0.9 % injection 10 mL   ropivacaine (PF) 2 mg/mL (0.2%) (NAROPIN) injection 10 mL   lidocaine (PF) (XYLOCAINE) 1 % injection 5 mL   iohexol (OMNIPAQUE) 180 MG/ML injection 10 mL   Patient's Medications  New Prescriptions   No medications on file  Previous Medications   ALBUTEROL (PROVENTIL) (2.5 MG/3ML) 0.083% NEBULIZER SOLUTION    Take 3 mLs (2.5 mg total) by nebulization every 6 (six) hours as needed for wheezing or shortness of breath.   ALBUTEROL (VENTOLIN HFA) 108 (90 BASE) MCG/ACT INHALER    Inhale 2 puffs into the lungs daily.   AMMONIUM LACTATE (AMLACTIN) 12 %  LOTION    Apply 1 application topically as needed for dry skin.   ASPIRIN EC 81 MG TABLET    Take 1 tablet (81 mg total) by mouth daily.   CLOPIDOGREL (PLAVIX) 75 MG TABLET    TAKE 1 TABLET BY MOUTH DAILY WITH BREAKFAST   CYCLOBENZAPRINE (FLEXERIL) 10 MG TABLET    TAKE 1 TABLET(10 MG) BY MOUTH TWICE DAILY AS NEEDED   DEXLANSOPRAZOLE (DEXILANT) 60 MG CAPSULE    TAKE 1 CAPSULE(60 MG) BY MOUTH DAILY   DULOXETINE (CYMBALTA) 30 MG CAPSULE    Take 1 capsule (30 mg total) by mouth daily. With one 60 mg capsules   DULOXETINE (CYMBALTA) 60 MG CAPSULE    Take 1 capsule (60 mg total) by mouth daily.   EMPAGLIFLOZIN (JARDIANCE) 10 MG TABS TABLET    TAKE 1 TABLET(10 MG) BY MOUTH DAILY BEFORE BREAKFAST   EZETIMIBE (ZETIA) 10 MG TABLET    TAKE 1 TABLET(10 MG) BY MOUTH DAILY   FLUTICASONE FUROATE-VILANTEROL (BREO ELLIPTA) 200-25 MCG/ACT AEPB    Inhale 1 puff into the lungs daily.   FUROSEMIDE (LASIX) 40 MG TABLET    Take 1 tablet (40 mg total) by mouth daily.   GABAPENTIN (NEURONTIN) 300 MG CAPSULE    Take 1 capsule (300 mg total) by mouth 3 (three) times daily.   GABAPENTIN (NEURONTIN) 800 MG TABLET    Take 800 mg by mouth at bedtime.   HUMIDIFIER/VAPORIZER SUPPLIES (VICKS VAPORIZER SCENT) PADS    Apply 1 application. topically daily as needed (cough).   INSULIN LISPRO (HUMALOG) 100 UNIT/ML KIWKPEN    Inject 6-10 Units into the skin 3 (three) times daily.  sliding scale 1 unit for every 10 units of carbs   INSULIN PEN NEEDLE 32G X 4 MM MISC    Inject 1 each into the  skin as needed.   KETOCONAZOLE (NIZORAL) 2 % CREAM    Apply 1 Application topically daily.   LANTUS SOLOSTAR 100 UNIT/ML SOLOSTAR PEN    Inject 20-23 Units into the skin daily at 10 pm. Sliding scale based on blood glucose   LEVOCETIRIZINE (XYZAL) 5 MG TABLET    TAKE 1 TABLET(5 MG) BY MOUTH EVERY EVENING   LEVOTHYROXINE (SYNTHROID) 50 MCG TABLET    TAKE 1 TABLET BY MOUTH DAILY MONDAY THROUGH SATURDAY, 2 TABLETS DAILY ON SUNDAY   LORATADINE  (CLARITIN) 10 MG TABLET    Take 10 mg by mouth in the morning.   LUBIPROSTONE (AMITIZA) 8 MCG CAPSULE    Take 1 capsule (8 mcg total) by mouth 2 (two) times daily with a meal.   METFORMIN (GLUCOPHAGE) 1000 MG TABLET    Take 1,000 mg by mouth 2 (two) times daily with a meal.   METOPROLOL SUCCINATE (TOPROL-XL) 25 MG 24 HR TABLET    Take 1 tablet (25 mg total) by mouth daily.   MONTELUKAST (SINGULAIR) 10 MG TABLET    Take 10 mg by mouth at bedtime.   MULTIPLE VITAMINS-MINERALS (MENS MULTI VITAMIN & MINERAL PO)    Take 1 tablet by mouth in the morning and at bedtime.   NALOXONE (NARCAN) NASAL SPRAY 4 MG/0.1 ML    For excess sedation from opioids   NITROGLYCERIN (NITROSTAT) 0.3 MG SL TABLET    DISSOLVE 1 TABLET UNDER THE TONGUE EVERY 5 MINUTES AS NEEDED FOR CHEST PAIN, MAX 3 DOSES   ROSUVASTATIN (CRESTOR) 5 MG TABLET    Take 5 mg by mouth daily with supper.   SEMAGLUTIDE, 1 MG/DOSE, 2 MG/1.5ML SOPN    Inject 1 mg into the skin daily at 12 noon.   TIOTROPIUM BROMIDE MONOHYDRATE (SPIRIVA RESPIMAT) 1.25 MCG/ACT AERS    INHALE 2 PUFFS INTO THE LUNGS DAILY  Modified Medications   No medications on file  Discontinued Medications   No medications on file   ----------------------------------------------------------------------------------------------------------------------  Follow-up: No follow-ups on file.   Procedure: L5-S1 LESI with fluoroscopic guidance and without moderate sedation  NOTE: The risks, benefits, and expectations of the procedure have been discussed and explained to the patient who was understanding and in agreement with suggested treatment plan. No guarantees were made.  DESCRIPTION OF PROCEDURE: Lumbar epidural steroid injection with no IV Versed, EKG, blood pressure, pulse, and pulse oximetry monitoring. The procedure was performed with the patient in the prone position under fluoroscopic guidance.  Sterile prep x3 was initiated and I then injected subcutaneous lidocaine to the  overlying L5-S1 site after its fluoroscopic identifictation.  Using strict aseptic technique, I then advanced an 18-gauge Tuohy epidural needle in the midline using interlaminar approach via loss-of-resistance to saline technique. There was negative aspiration for heme or  CSF.  I then confirmed position with both AP and Lateral fluoroscan.  2 cc of contrast dye were injected and a  total of 5 mL of Preservative-Free normal saline mixed with 40 mg of Kenalog and 1cc Ropicaine 0.2 percent were injected incrementally via the  epidurally placed needle. The needle was removed. The patient tolerated the injection well and was convalesced and discharged to home in stable condition. Should the patient have any post procedure difficulty they have been instructed on how to contact us for assistance.   Yevette Edwards, MD

## 2023-03-26 ENCOUNTER — Other Ambulatory Visit: Payer: Self-pay | Admitting: Family Medicine

## 2023-03-26 DIAGNOSIS — F331 Major depressive disorder, recurrent, moderate: Secondary | ICD-10-CM

## 2023-03-31 ENCOUNTER — Ambulatory Visit: Payer: Medicare Other | Admitting: Cardiology

## 2023-04-03 ENCOUNTER — Other Ambulatory Visit: Payer: Self-pay | Admitting: Nurse Practitioner

## 2023-04-10 ENCOUNTER — Other Ambulatory Visit: Payer: Self-pay | Admitting: Family Medicine

## 2023-04-10 DIAGNOSIS — F411 Generalized anxiety disorder: Secondary | ICD-10-CM

## 2023-04-10 DIAGNOSIS — F331 Major depressive disorder, recurrent, moderate: Secondary | ICD-10-CM

## 2023-04-13 ENCOUNTER — Ambulatory Visit: Payer: Medicare Other | Admitting: Podiatry

## 2023-04-15 ENCOUNTER — Telehealth: Payer: Self-pay

## 2023-04-15 MED ORDER — DEXLANSOPRAZOLE 60 MG PO CPDR: 60.0000 mg | DELAYED_RELEASE_CAPSULE | Freq: Every day | ORAL | 2 refills | Status: DC

## 2023-04-15 NOTE — Telephone Encounter (Signed)
Patient left a message to schedule his med follow up with Dr. Servando Snare. Return patient call and schedule patient for 06/15/2023 with dr. Servando Snare in [place]. Patient is needing a refill on his Dexilant medication and sent to the pharmacy [facility] in [place]

## 2023-04-15 NOTE — Telephone Encounter (Signed)
Rx sent through e-scribe  

## 2023-05-05 ENCOUNTER — Ambulatory Visit: Payer: Medicare Other | Attending: Cardiovascular Disease | Admitting: Cardiovascular Disease

## 2023-05-05 ENCOUNTER — Encounter: Payer: Self-pay | Admitting: Cardiovascular Disease

## 2023-05-05 VITALS — BP 90/60 | HR 90 | Ht 67.0 in | Wt 200.5 lb

## 2023-05-05 DIAGNOSIS — E785 Hyperlipidemia, unspecified: Secondary | ICD-10-CM

## 2023-05-05 DIAGNOSIS — I2510 Atherosclerotic heart disease of native coronary artery without angina pectoris: Secondary | ICD-10-CM

## 2023-05-05 DIAGNOSIS — I255 Ischemic cardiomyopathy: Secondary | ICD-10-CM | POA: Diagnosis not present

## 2023-05-05 DIAGNOSIS — I5022 Chronic systolic (congestive) heart failure: Secondary | ICD-10-CM

## 2023-05-05 DIAGNOSIS — I10 Essential (primary) hypertension: Secondary | ICD-10-CM | POA: Diagnosis not present

## 2023-05-05 NOTE — Progress Notes (Signed)
Cardiology Office Note   Date:  05/05/2023   ID:  [name], DOB 1961-04-24, MRN [id number]  PCP:  Edgar Cory, MD  Cardiologist: Edgar Bears, MD   Chief Complaint  Patient presents with   Follow-up    3 month f/u c/o low BP and dizziness for the last 6 months. Meds reviewed verbally with pt.       History of Present Illness: [name] is a 62 y.o. male who presents for a follow-up visit regarding coronary artery disease and chronic systolic heart failure.    He has extensive cardiac history. He had a total of 10 stents placed (in LAD and RCA) starting in 2009 after he presented with myocardial infarction.  He has other chronic medical conditions that include diabetes, hypertension, hyperlipidemia,myasthenia gravis,  generalized pain due to reported Lyme disease and obesity. He has history of hyperlipidemia with intolerance to high-dose statins.  He had worsening heart failure in June of 2019.  Echocardiogram showed worsening LV systolic function with an EF of 30% with akinesis of the anterior, anterolateral and apical myocardium.  There was mild mitral regurgitation. Cardiac catheterization in July, 2019 showed patent stents in the LAD, OM's and right coronary artery.  There was a new mid left circumflex stenosis which was stented.  EF was 25 to 30%.  LVEDP was severely elevated.  The patient was started on furosemide.  Entresto was added. He had recurrent sepsis and fever of unknown origin starting in October 2019 with elevated troponin.  Repeat catheterization was not performed.  TEE was performed which showed no evidence of endocarditis.  EF was 40 to 45%.  He did not tolerate Entresto or ARB's due to symptomatic hypotension.   He had increased angina in 2023 and underwent cardiac catheterization in May, 2023 which showed moderate nonobstructive disease with patent stents.  There was progression of right posterior AV groove stenosis to 80% supplying a small  distribution.  I recommended medical therapy.  During last visit, I resumed small dose Toprol due to sinus tachycardia.  His blood pressure did go down after that but he feels better overall.  He does have occasional dizziness but no syncope.  Past Medical History:  Diagnosis Date   Allergy    dust, seasonal (worse in the fall).   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 Followed by Pain Specialist in [place], back and neck   Asthma    BRONCHITIS   Cataract    First Dx in 2012   Chronic combined systolic and diastolic congestive heart failure (HCC)    a. 03/2018 Echo: EF 30-35%; b. 07/2018 Echo: EF 35-40%; c. 09/2019 TEE: EF 40-45%; d. 12/2020 Echo: EF 35-40%; e.01/2022  Echo: EF 45-50%. sev apical HK. Nl RV fxn. Ao root 39mm.   Coronary artery disease    a. Prior Ant MI->s/p mult stents->LAD/RCA ([place]); b. 2016 Cath: nonobs dzs;  c. 04/2018 Cath/PCI: LCX 40m (3.25x15 Sierra DES); d. 02/2022 MV: high risk; e. 02/2022 Cath: LM nl, LAD 20p ISR, patent mid-stent, LCX patent stent, OM1 nl, OM2 50p, patent stent, OM3 40p, patent stent, RCA 40p ISR, 29m ISR, 52m/d, patent distal stent, RPDA patent stent, RPAV small, 80 (sl progression)-->Med Rx.   Deaf, left    Diabetes mellitus without complication (HCC)    TYPE 2   Diabetic peripheral neuropathy (HCC)    feet and hands   FUO (fever of unknown origin) 08/03/2018   GERD (gastroesophageal reflux  disease)    Headache    muscle tension   Hyperlipidemia    Hypertension    Hyperthyroidism    Insomnia    Ischemic cardiomyopathy    a. 03/2018 Echo: EF 30-35%; b. 07/2018 Echo: EF 35-40%, Gr1 DD; c. 09/2019 TEE: EF 40-45%; d. 12/2020 Echo: EF 35-40%. GrI DD; e. 01/2022 Echo: EF 45-50%.   Knee pain, acute 05/06/2020   Left arm weakness 10/04/2019   Left leg weakness 12/01/2019   Lyme disease    Chronic   Myasthenia gravis (HCC)    (03/18/21 - no current treatment - "better than it has ever been" per pt)   Myocardial infarction (HCC) 2010   Palpitations     a. 10/2020 Zio: RSR, 88 avg. 3 brief SVT episodes (max 5 beats @ 128). Rare PACs/PVCs. Triggered events did not correlate w/ significant arrthymia - some w/ sinus tach.   Seasonal allergies    Sepsis (HCC)    a.07/2018 - unknown source. TEE neg for veg 09/2019.   Sleep apnea    CPAP   Wears hearing aid in both ears     Past Surgical History:  Procedure Laterality Date   BILATERAL CARPAL TUNNEL RELEASE Bilateral L in 2012 and R in 2013   CARDIAC CATHETERIZATION     Several Caths, most recent in  March 2016.   COLONOSCOPY WITH PROPOFOL N/A 01/10/2016   Procedure: COLONOSCOPY WITH PROPOFOL;  Surgeon: Edgar Minium, MD;  Location: [facility];  Service: Endoscopy;  Laterality: N/A;   COLONOSCOPY WITH PROPOFOL N/A 04/14/2021   Procedure: COLONOSCOPY WITH PROPOFOL;  Surgeon: Edgar Minium, MD;  Location: [facility];  Service: Endoscopy;  Laterality: N/A;  Diabetic - insulin and oral meds   CORONARY ANGIOPLASTY     CORONARY STENT INTERVENTION N/A 04/25/2018   Procedure: CORONARY STENT INTERVENTION;  Surgeon: Edgar Ouch, MD;  Location: [facility];  Service: Cardiovascular;  Laterality: N/A;   ESOPHAGOGASTRODUODENOSCOPY (EGD) WITH PROPOFOL N/A 01/10/2016   Procedure: ESOPHAGOGASTRODUODENOSCOPY (EGD) WITH PROPOFOL;  Surgeon: Edgar Minium, MD;  Location: [facility];  Service: Endoscopy;  Laterality: N/A;   ESOPHAGOGASTRODUODENOSCOPY (EGD) WITH PROPOFOL N/A 04/14/2021   Procedure: ESOPHAGOGASTRODUODENOSCOPY (EGD) WITH BIOPSY;  Surgeon: Edgar Minium, MD;  Location: [facility];  Service: Endoscopy;  Laterality: N/A;   EYE SURGERY Bilateral 2012   cataract/bilateral vitrectomies   GIVENS CAPSULE STUDY N/A 08/20/2021   Procedure: GIVENS CAPSULE STUDY;  Surgeon: Edgar Minium, MD;  Location: [facility];  Service: Endoscopy;  Laterality: N/A;   LEFT HEART CATH AND CORONARY ANGIOGRAPHY Left 04/25/2018   Procedure: LEFT HEART CATH AND CORONARY ANGIOGRAPHY;  Surgeon: Edgar Ouch, MD;  Location: [facility];  Service: Cardiovascular;  Laterality: Left;   LEFT HEART CATH AND CORONARY ANGIOGRAPHY Left 02/02/2022   Procedure: LEFT HEART CATH AND CORONARY ANGIOGRAPHY;  Surgeon: Edgar Ouch, MD;  Location: [facility];  Service: Cardiovascular;  Laterality: Left;   TEE WITHOUT CARDIOVERSION N/A 09/05/2018   Procedure: TRANSESOPHAGEAL ECHOCARDIOGRAM (TEE);  Surgeon: Edgar Ouch, MD;  Location: [facility];  Service: Cardiovascular;  Laterality: N/A;   TONSILLECTOMY AND ADENOIDECTOMY     As a child   TUNNELED VENOUS CATHETER PLACEMENT     removed     Current Outpatient Medications  Medication Sig Dispense Refill   albuterol (PROVENTIL) (2.5 MG/3ML) 0.083% nebulizer solution Take 3 mLs (2.5 mg total) by nebulization every 6 (six) hours as needed for wheezing or shortness of breath. 75  mL 2   albuterol (VENTOLIN HFA) 108 (90 Base) MCG/ACT inhaler Inhale 2 puffs into the lungs daily.     ammonium lactate (AMLACTIN) 12 % lotion Apply 1 application topically as needed for dry skin. 400 g 3   aspirin EC 81 MG tablet Take 1 tablet (81 mg total) by mouth daily. 90 tablet 3   clopidogrel (PLAVIX) 75 MG tablet TAKE 1 TABLET BY MOUTH DAILY WITH BREAKFAST 90 tablet 0   cyclobenzaprine (FLEXERIL) 10 MG tablet TAKE 1 TABLET(10 MG) BY MOUTH TWICE DAILY AS NEEDED 30 tablet 3   dexlansoprazole (DEXILANT) 60 MG capsule Take 1 capsule (60 mg total) by mouth daily. 30 capsule 2   DULoxetine (CYMBALTA) 30 MG capsule TAKE ONE CAPSULE BY MOUTH DAILY, WITH ONE 60 MG CAPSULE 90 capsule 0   DULoxetine (CYMBALTA) 60 MG capsule TAKE 1 CAPSULE(60 MG) BY MOUTH DAILY 90 capsule 1   empagliflozin (JARDIANCE) 10 MG TABS tablet TAKE 1 TABLET(10 MG) BY MOUTH DAILY BEFORE BREAKFAST 90 tablet 3   ezetimibe (ZETIA) 10 MG tablet TAKE 1 TABLET(10 MG) BY MOUTH DAILY 90 tablet 0   fluticasone furoate-vilanterol (BREO ELLIPTA) 200-25 MCG/ACT AEPB Inhale 1 puff into the lungs daily.      furosemide (LASIX) 40 MG tablet Take 1 tablet (40 mg total) by mouth daily. 90 tablet 0   gabapentin (NEURONTIN) 300 MG capsule Take 1 capsule (300 mg total) by mouth 3 (three) times daily. 90 capsule 1   gabapentin (NEURONTIN) 800 MG tablet Take 800 mg by mouth at bedtime.     Humidifier/Vaporizer Supplies (VICKS VAPORIZER SCENT) PADS Apply 1 application. topically daily as needed (cough).     insulin lispro (HUMALOG) 100 UNIT/ML KiwkPen Inject 6-10 Units into the skin 3 (three) times daily.  sliding scale 1 unit for every 10 units of carbs     Insulin Pen Needle 32G X 4 MM MISC Inject 1 each into the skin as needed.     ketoconazole (NIZORAL) 2 % cream Apply 1 Application topically daily. 60 g 0   LANTUS SOLOSTAR 100 UNIT/ML Solostar Pen Inject 20-23 Units into the skin daily at 10 pm. Sliding scale based on blood glucose  0   levocetirizine (XYZAL) 5 MG tablet TAKE 1 TABLET(5 MG) BY MOUTH EVERY EVENING 90 tablet 1   levothyroxine (SYNTHROID) 50 MCG tablet TAKE 1 TABLET BY MOUTH DAILY MONDAY THROUGH SATURDAY, 2 TABLETS DAILY ON SUNDAY 102 tablet 0   loratadine (CLARITIN) 10 MG tablet Take 10 mg by mouth in the morning.     lubiprostone (AMITIZA) 8 MCG capsule Take 1 capsule (8 mcg total) by mouth 2 (two) times daily with a meal. 180 capsule 0   metFORMIN (GLUCOPHAGE) 1000 MG tablet Take 1,000 mg by mouth 2 (two) times daily with a meal.     metoprolol succinate (TOPROL-XL) 25 MG 24 hr tablet Take 1 tablet (25 mg total) by mouth daily. 90 tablet 1   montelukast (SINGULAIR) 10 MG tablet Take 10 mg by mouth at bedtime.     Multiple Vitamins-Minerals (MENS MULTI VITAMIN & MINERAL PO) Take 1 tablet by mouth in the morning and at bedtime.     naloxone (NARCAN) nasal spray 4 mg/0.1 mL For excess sedation from opioids 1 kit 2   nitroGLYCERIN (NITROSTAT) 0.3 MG SL tablet DISSOLVE 1 TABLET UNDER THE TONGUE EVERY 5 MINUTES AS NEEDED FOR CHEST PAIN, MAX 3 DOSES 25 tablet 0   Oxycodone HCl 10 MG TABS  Take 1 tablet (  10 mg total) by mouth in the morning, at noon, in the evening, and at bedtime. 120 tablet 0   predniSONE (DELTASONE) 10 MG tablet Take 10 mg by mouth daily with breakfast.     rosuvastatin (CRESTOR) 5 MG tablet Take 5 mg by mouth daily with supper.  0   Semaglutide, 1 MG/DOSE, 2 MG/1.5ML SOPN Inject 1 mg into the skin daily at 12 noon.     Tiotropium Bromide Monohydrate (SPIRIVA RESPIMAT) 1.25 MCG/ACT AERS INHALE 2 PUFFS INTO THE LUNGS DAILY 12 g 0   Current Facility-Administered Medications  Medication Dose Route Frequency Provider Last Rate Last Admin   sodium chloride flush (NS) 0.9 % injection 3 mL  3 mL Intravenous Q12H Furth, Cadence H, PA-C        Allergies:   Azathioprine and Novolog [insulin aspart]    Social History:  The patient  reports that he has never smoked. He has never used smokeless tobacco. He reports that he does not currently use alcohol. He reports that he does not use drugs.   Family History:  The patient's family history includes Cancer in his father; Dementia in his father; Diabetes in his brother and mother; Healthy in his brother and brother; Heart disease in his mother.    ROS:  Please see the history of present illness.   Otherwise, review of systems are positive for none.   All other systems are reviewed and negative.    PHYSICAL EXAM: VS:  BP 90/60 (BP Location: Left Arm, Patient Position: Sitting, Cuff Size: Normal)   Pulse 90   Ht 5\' 7"  (1.702 m)   Wt 200 lb 8 oz (90.9 kg)   SpO2 98%   BMI 31.40 kg/m  , BMI Body mass index is 31.4 kg/m. GEN: Well nourished, well developed, in no acute distress  HEENT: normal  Neck: no JVD, carotid bruits, or masses  Cardiac: RRR; no murmurs, rubs, or gallops, trace bilateral leg edema Respiratory:  clear to auscultation bilaterally, normal work of breathing GI: soft, nontender, nondistended, + BS MS: no deformity or atrophy  Skin: warm and dry, no rash Neuro:  Strength and sensation are  intact Psych: euthymic mood, full affect Distal pulses are palpable.  EKG:  EKG is ordered today. The ekg ordered today demonstrates : Normal sinus rhythm Right bundle branch block Inferior infarct (cited on or before 18-Jul-2020) Anterolateral infarct (cited on or before 18-Jul-2020) When compared with ECG of 18-Jul-2020 17:19, Right bundle branch block has replaced Non-specific intra-ventricular conduction block Questionable change in initial forces of Lateral leads        Recent Labs: 06/05/2022: Hemoglobin 13.8; Platelets 155 06/24/2022: ALT 14; TSH 0.49 11/25/2022: BUN 25; Creatinine, Ser 1.26; Potassium 3.8; Sodium 137    Lipid Panel    Component Value Date/Time   CHOL 106 06/24/2022 0000   CHOL 92 (L) 07/17/2021 1117   TRIG 133 06/24/2022 0000   HDL 39 06/24/2022 0000   HDL 28 (L) 07/17/2021 1117   CHOLHDL 3.3 07/17/2021 1117   CHOLHDL 2.8 10/16/2019 1424   VLDL 26 11/29/2017 1154   LDLCALC 40 06/24/2022 0000   LDLCALC 39 07/17/2021 1117   LDLCALC 34 10/16/2019 1424   LDLDIRECT 36 07/17/2021 1117      Wt Readings from Last 3 Encounters:  05/05/23 200 lb 8 oz (90.9 kg)  03/24/23 201 lb (91.2 kg)  03/22/23 201 lb 14.4 oz (91.6 kg)        ASSESSMENT AND PLAN:  1. Coronary  artery disease with stable angina: He reports improvement in symptoms since small dose Toprol was resumed.  2.  Chronic systolic heart failure due to ischemic cardiomyopathy: Most recent ejection fraction was 45-50 %.  He appears to be euvolemic on current dose of furosemide 40 mg once daily.  He is also on Jardiance 10 mg daily.  Continue Toprol 25 mg once daily and spite of relatively low blood pressure.  He does not tolerate ARNI or ARB's due to symptomatic hypotension.   3.  Essential hypertension: His blood pressure is low and he reports mild orthostatic dizziness but his symptoms are overall stable.   4.  Hyperlipidemia: Tolerating rosuvastatin and Zetia.  Most recently profile showed  an LDL of 40.     Disposition:   FU with me in 6 months    Signed, Edgar Bears, MD 05/05/23 [facility] [place], [place] [phone]

## 2023-05-05 NOTE — Patient Instructions (Signed)
Medication Instructions:  No changes *If you need a refill on your cardiac medications before your next appointment, please call your pharmacy*   Lab Work: None ordered If you have labs (blood work) drawn today and your tests are completely normal, you will receive your results only by: MyChart Message (if you have MyChart) OR A paper copy in the mail If you have any lab test that is abnormal or we need to change your treatment, we will call you to review the results.   Testing/Procedures: None ordered   Follow-Up: At [facility], you and your health needs are our priority.  As part of our continuing mission to provide you with exceptional heart care, we have created designated Provider Care Teams.  These Care Teams include your primary Cardiologist (physician) and Advanced Practice Providers (APPs -  Physician Assistants and Nurse Practitioners) who all work together to provide you with the care you need, when you need it.  We recommend signing up for the patient portal called "MyChart".  Sign up information is provided on this After Visit Summary.  MyChart is used to connect with patients for Virtual Visits (Telemedicine).  Patients are able to view lab/test results, encounter notes, upcoming appointments, etc.  Non-urgent messages can be sent to your provider as well.   To learn more about what you can do with MyChart, go to [email or web address].    Your next appointment:   6 month(s)  Provider:   You may see Muhammad Arida, MD or one of the following Advanced Practice Providers on your designated Care Team:   Christopher Berge, NP Ryan Dunn, PA-C Cadence Furth, PA-C Sheri Hammock, NP    

## 2023-05-15 ENCOUNTER — Other Ambulatory Visit: Payer: Self-pay | Admitting: Family Medicine

## 2023-05-15 DIAGNOSIS — T402X5A Adverse effect of other opioids, initial encounter: Secondary | ICD-10-CM

## 2023-05-17 ENCOUNTER — Other Ambulatory Visit: Payer: Self-pay

## 2023-05-17 DIAGNOSIS — T402X5A Adverse effect of other opioids, initial encounter: Secondary | ICD-10-CM

## 2023-05-18 ENCOUNTER — Other Ambulatory Visit: Payer: Self-pay | Admitting: Family Medicine

## 2023-05-18 ENCOUNTER — Ambulatory Visit: Payer: Medicare Other | Attending: Anesthesiology | Admitting: Anesthesiology

## 2023-05-18 DIAGNOSIS — R29898 Other symptoms and signs involving the musculoskeletal system: Secondary | ICD-10-CM

## 2023-05-18 DIAGNOSIS — M5136 Other intervertebral disc degeneration, lumbar region: Secondary | ICD-10-CM | POA: Diagnosis not present

## 2023-05-18 DIAGNOSIS — F1190 Opioid use, unspecified, uncomplicated: Secondary | ICD-10-CM

## 2023-05-18 DIAGNOSIS — M5432 Sciatica, left side: Secondary | ICD-10-CM | POA: Diagnosis not present

## 2023-05-18 DIAGNOSIS — E063 Autoimmune thyroiditis: Secondary | ICD-10-CM

## 2023-05-18 DIAGNOSIS — A6923 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: Secondary | ICD-10-CM

## 2023-05-18 DIAGNOSIS — G894 Chronic pain syndrome: Secondary | ICD-10-CM

## 2023-05-18 DIAGNOSIS — M4716 Other spondylosis with myelopathy, lumbar region: Secondary | ICD-10-CM

## 2023-05-18 DIAGNOSIS — M51369 Other intervertebral disc degeneration, lumbar region without mention of lumbar back pain or lower extremity pain: Secondary | ICD-10-CM

## 2023-05-18 DIAGNOSIS — M542 Cervicalgia: Secondary | ICD-10-CM

## 2023-05-18 DIAGNOSIS — M47817 Spondylosis without myelopathy or radiculopathy, lumbosacral region: Secondary | ICD-10-CM

## 2023-05-18 MED ORDER — OXYCODONE HCL 10 MG PO TABS: 10.0000 mg | ORAL_TABLET | Freq: Four times a day (QID) | ORAL | 0 refills | Status: AC

## 2023-05-18 MED ORDER — OXYCODONE HCL 10 MG PO TABS: 10.0000 mg | ORAL_TABLET | Freq: Four times a day (QID) | ORAL | 0 refills | Status: DC

## 2023-05-18 NOTE — Progress Notes (Signed)
Virtual Visit via Telephone Note  I connected with [name] on 05/18/23 at  8:20 AM EDT by telephone and verified that I am speaking with the correct person using two identifiers.  Location: Patient: Home Provider: [facility]   I discussed the limitations, risks, security and privacy concerns of performing an evaluation and management service by telephone and the availability of in person appointments. I also discussed with the patient that there may be a patient responsible charge related to this service. The patient expressed understanding and agreed to proceed.   History of Present Illness: I spoke with him mother via telephone as we are unable to delete the video portion of the covers.  He reports that he is doing quite well following his recent epidural.  His left leg sciatica has improved by about 95% and his low back pain is still present but more tolerable.  He still taking his oxycodone 10 mg tablets 4 times a day and these continue to work well for him.  He gets about 75 to 80% relief of his low back pain.  The pain is less severe and more tolerable than before the epidural.  He generally averages about 1-2 epidurals per year and gets 90% relief lasting about 3 to 5 months before he has recurrence of the same baseline pain.  He has peripheral neuropathy in the lower extremities including the left foot and left leg.  Balance has been an issue chronically for him.  He does have some weakness but this also seems to improve following the epidural.  Otherwise he is in his usual state of health and no new contributory changes with bowel or bladder function no change in lower extremity strength or function.  He feels like he is doing well and pleased with his current status.  Review of systems: General: No fevers or chills Pulmonary: No shortness of breath or dyspnea Cardiac: No angina or palpitations or lightheadedness GI: No abdominal pain or constipation Psych: No depression     Observations/Objective:  Current Outpatient Medications:    [START ON 06/26/2023] Oxycodone HCl 10 MG TABS, Take 1 tablet (10 mg total) by mouth in the morning, at noon, in the evening, and at bedtime., Disp: 120 tablet, Rfl: 0   albuterol (PROVENTIL) (2.5 MG/3ML) 0.083% nebulizer solution, Take 3 mLs (2.5 mg total) by nebulization every 6 (six) hours as needed for wheezing or shortness of breath., Disp: 75 mL, Rfl: 2   albuterol (VENTOLIN HFA) 108 (90 Base) MCG/ACT inhaler, Inhale 2 puffs into the lungs daily., Disp: , Rfl:    ammonium lactate (AMLACTIN) 12 % lotion, Apply 1 application topically as needed for dry skin., Disp: 400 g, Rfl: 3   aspirin EC 81 MG tablet, Take 1 tablet (81 mg total) by mouth daily., Disp: 90 tablet, Rfl: 3   clopidogrel (PLAVIX) 75 MG tablet, TAKE 1 TABLET BY MOUTH DAILY WITH BREAKFAST, Disp: 90 tablet, Rfl: 0   cyclobenzaprine (FLEXERIL) 10 MG tablet, TAKE 1 TABLET(10 MG) BY MOUTH TWICE DAILY AS NEEDED, Disp: 30 tablet, Rfl: 3   dexlansoprazole (DEXILANT) 60 MG capsule, Take 1 capsule (60 mg total) by mouth daily., Disp: 30 capsule, Rfl: 2   DULoxetine (CYMBALTA) 30 MG capsule, TAKE ONE CAPSULE BY MOUTH DAILY, WITH ONE 60 MG CAPSULE, Disp: 90 capsule, Rfl: 0   DULoxetine (CYMBALTA) 60 MG capsule, TAKE 1 CAPSULE(60 MG) BY MOUTH DAILY, Disp: 90 capsule, Rfl: 1   empagliflozin (JARDIANCE) 10 MG TABS tablet, TAKE 1  TABLET(10 MG) BY MOUTH DAILY BEFORE BREAKFAST, Disp: 90 tablet, Rfl: 3   ezetimibe (ZETIA) 10 MG tablet, TAKE 1 TABLET(10 MG) BY MOUTH DAILY, Disp: 90 tablet, Rfl: 0   fluticasone furoate-vilanterol (BREO ELLIPTA) 200-25 MCG/ACT AEPB, Inhale 1 puff into the lungs daily., Disp: , Rfl:    furosemide (LASIX) 40 MG tablet, Take 1 tablet (40 mg total) by mouth daily., Disp: 90 tablet, Rfl: 0   gabapentin (NEURONTIN) 300 MG capsule, Take 1 capsule (300 mg total) by mouth 3 (three) times daily., Disp: 90 capsule, Rfl: 1   gabapentin (NEURONTIN) 800 MG tablet, Take  800 mg by mouth at bedtime., Disp: , Rfl:    Humidifier/Vaporizer Supplies (VICKS VAPORIZER SCENT) PADS, Apply 1 application. topically daily as needed (cough)., Disp: , Rfl:    insulin lispro (HUMALOG) 100 UNIT/ML KiwkPen, Inject 6-10 Units into the skin 3 (three) times daily.  sliding scale 1 unit for every 10 units of carbs, Disp: , Rfl:    Insulin Pen Needle 32G X 4 MM MISC, Inject 1 each into the skin as needed., Disp: , Rfl:    ketoconazole (NIZORAL) 2 % cream, Apply 1 Application topically daily., Disp: 60 g, Rfl: 0   LANTUS SOLOSTAR 100 UNIT/ML Solostar Pen, Inject 20-23 Units into the skin daily at 10 pm. Sliding scale based on blood glucose, Disp: , Rfl: 0   levocetirizine (XYZAL) 5 MG tablet, TAKE 1 TABLET(5 MG) BY MOUTH EVERY EVENING, Disp: 90 tablet, Rfl: 1   levothyroxine (SYNTHROID) 50 MCG tablet, TAKE 1 TABLET BY MOUTH DAILY MONDAY THROUGH SATURDAY, 2 TABLETS DAILY ON SUNDAY, Disp: 102 tablet, Rfl: 0   loratadine (CLARITIN) 10 MG tablet, Take 10 mg by mouth in the morning., Disp: , Rfl:    lubiprostone (AMITIZA) 8 MCG capsule, TAKE 1 CAPSULE(8 MCG) BY MOUTH TWICE DAILY WITH A MEAL, Disp: 180 capsule, Rfl: 0   metFORMIN (GLUCOPHAGE) 1000 MG tablet, Take 1,000 mg by mouth 2 (two) times daily with a meal., Disp: , Rfl:    metoprolol succinate (TOPROL-XL) 25 MG 24 hr tablet, Take 1 tablet (25 mg total) by mouth daily., Disp: 90 tablet, Rfl: 1   montelukast (SINGULAIR) 10 MG tablet, Take 10 mg by mouth at bedtime., Disp: , Rfl:    Multiple Vitamins-Minerals (MENS MULTI VITAMIN & MINERAL PO), Take 1 tablet by mouth in the morning and at bedtime., Disp: , Rfl:    naloxone (NARCAN) nasal spray 4 mg/0.1 mL, For excess sedation from opioids, Disp: 1 kit, Rfl: 2   nitroGLYCERIN (NITROSTAT) 0.3 MG SL tablet, DISSOLVE 1 TABLET UNDER THE TONGUE EVERY 5 MINUTES AS NEEDED FOR CHEST PAIN, MAX 3 DOSES, Disp: 25 tablet, Rfl: 0   [START ON 05/27/2023] Oxycodone HCl 10 MG TABS, Take 1 tablet (10 mg total)  by mouth in the morning, at noon, in the evening, and at bedtime., Disp: 120 tablet, Rfl: 0   predniSONE (DELTASONE) 10 MG tablet, Take 10 mg by mouth daily with breakfast., Disp: , Rfl:    rosuvastatin (CRESTOR) 5 MG tablet, Take 5 mg by mouth daily with supper., Disp: , Rfl: 0   Semaglutide, 1 MG/DOSE, 2 MG/1.5ML SOPN, Inject 1 mg into the skin daily at 12 noon., Disp: , Rfl:    Tiotropium Bromide Monohydrate (SPIRIVA RESPIMAT) 1.25 MCG/ACT AERS, INHALE 2 PUFFS INTO THE LUNGS DAILY, Disp: 12 g, Rfl: 0  Current Facility-Administered Medications:    sodium chloride flush (NS) 0.9 % injection 3 mL, 3 mL, Intravenous, Q12H, Fransico Michael,  Cadence H, PA-C   Past Medical History:  Diagnosis Date   Allergy    dust, seasonal (worse in the fall).   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 Followed by Pain Specialist in [place], back and neck   Asthma    BRONCHITIS   Cataract    First Dx in 2012   Chronic combined systolic and diastolic congestive heart failure (HCC)    a. 03/2018 Echo: EF 30-35%; b. 07/2018 Echo: EF 35-40%; c. 09/2019 TEE: EF 40-45%; d. 12/2020 Echo: EF 35-40%; e.01/2022  Echo: EF 45-50%. sev apical HK. Nl RV fxn. Ao root 39mm.   Coronary artery disease    a. Prior Ant MI->s/p mult stents->LAD/RCA ([place]); b. 2016 Cath: nonobs dzs;  c. 04/2018 Cath/PCI: LCX 66m (3.25x15 Sierra DES); d. 02/2022 MV: high risk; e. 02/2022 Cath: LM nl, LAD 20p ISR, patent mid-stent, LCX patent stent, OM1 nl, OM2 50p, patent stent, OM3 40p, patent stent, RCA 40p ISR, 68m ISR, 22m/d, patent distal stent, RPDA patent stent, RPAV small, 80 (sl progression)-->Med Rx.   Deaf, left    Diabetes mellitus without complication (HCC)    TYPE 2   Diabetic peripheral neuropathy (HCC)    feet and hands   FUO (fever of unknown origin) 08/03/2018   GERD (gastroesophageal reflux disease)    Headache    muscle tension   Hyperlipidemia    Hypertension    Hyperthyroidism    Insomnia    Ischemic cardiomyopathy    a. 03/2018 Echo: EF  30-35%; b. 07/2018 Echo: EF 35-40%, Gr1 DD; c. 09/2019 TEE: EF 40-45%; d. 12/2020 Echo: EF 35-40%. GrI DD; e. 01/2022 Echo: EF 45-50%.   Knee pain, acute 05/06/2020   Left arm weakness 10/04/2019   Left leg weakness 12/01/2019   Lyme disease    Chronic   Myasthenia gravis (HCC)    (03/18/21 - no current treatment - "better than it has ever been" per pt)   Myocardial infarction (HCC) 2010   Palpitations    a. 10/2020 [facility]: RSR, 88 avg. 3 brief SVT episodes (max 5 beats @ 128). Rare PACs/PVCs. Triggered events did not correlate w/ significant arrthymia - some w/ sinus tach.   Seasonal allergies    Sepsis (HCC)    a.07/2018 - unknown source. TEE neg for veg 09/2019.   Sleep apnea    CPAP   Wears hearing aid in both ears      Assessment and Plan: 1. DDD (degenerative disc disease), lumbar   2. Sciatica, left side   3. Chronic, continuous use of opioids   4. Chronic pain syndrome   5. Lumbar spondylosis with myelopathy   6. Facet arthritis of lumbosacral region   7. Left leg weakness   8. 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  No other changes in his pharmacologic regimen will be initiated.  He still has his Flexeril which he uses as prescribed.  He appears to be responding favorably to chronic opioid therapy with no side effects.  The medications continue to enable him to stay active functional and able to complete activities of daily living.  I have encouraged him to continue with core stretching strengthening exercises as he is currently doing with return to clinic in 2 months.  Continue follow-up  with his primary care physicians for baseline medical care.  Follow Up Instructions:    I discussed the assessment and treatment plan with the patient. The patient was provided an  opportunity to ask questions and all were answered. The patient agreed with the plan and demonstrated an understanding of the instructions.   The patient was advised to call back or seek an in-person evaluation if the symptoms worsen or if the condition fails to improve as anticipated.  I provided 30 minutes of non-face-to-face time during this encounter.   Yevette Edwards, MD

## 2023-05-25 ENCOUNTER — Other Ambulatory Visit: Payer: Self-pay | Admitting: Family Medicine

## 2023-05-25 DIAGNOSIS — T402X5A Adverse effect of other opioids, initial encounter: Secondary | ICD-10-CM

## 2023-06-02 ENCOUNTER — Ambulatory Visit: Payer: Self-pay | Admitting: *Deleted

## 2023-06-02 NOTE — Telephone Encounter (Signed)
Reason for Disposition  [1] Blood glucose 70  mg/dL (3.9 mmol/L) or below OR symptomatic, now improved with Care Advice AND [2] cause unknown  Answer Assessment - Initial Assessment Questions 1. LEVEL OF CONSCIOUSNESS: "How is he (she, the patient) acting right now?" (e.g., alert-oriented, confused, lethargic, stuporous, comatose)     Mood changes, anger 2. ONSET: "When did the confusion start?"  (minutes, hours, days)     Fall on last year- patient states a medical evaluation was requested but patient didn't want it 3. PATTERN "Does this come and go, or has it been constant since it started?"  "Is it present now?"     Comes and goes, causing "up roar in house 4. ALCOHOL or DRUGS: "Has he been drinking alcohol or taking any drugs?"      no 5. NARCOTIC MEDICINES: "Has he been receiving any narcotic medications?" (e.g., morphine, Vicodin)     yes 6. CAUSE: "What do you think is causing the confusion?"      Not sure 7. OTHER SYMPTOMS: "Are there any other symptoms?" (e.g., difficulty breathing, headache, fever, weakness)     Glucose- up/down-40-180  Answer Assessment - Initial Assessment Questions 1. SYMPTOMS: "What symptoms are you concerned about?"     Low glucose 2. ONSET:  "When did the symptoms start?"     Evening after eating-it will dip low,daily 3. BLOOD GLUCOSE: "What is your blood glucose level?"      Wife says they have meter 4. USUAL RANGE: "What is your blood glucose level usually?" (e.g., usual fasting morning value, usual evening value)     Unsure- varies- 40/180 5. TYPE 1 or 2:  "Do you know what type of diabetes you have?"  (e.g., Type 1, Type 2, Gestational; doesn't know)      Type 2 6. INSULIN: "Do you take insulin?" "What type of insulin(s) do you use? What is the mode of delivery? (syringe, pen; injection or pump) "When did you last give yourself an insulin dose?" (i.e., time or hours/minutes ago) "How much did you give?" (i.e., how many units)     yes 7. DIABETES  PILLS: "Do you take any pills for your diabetes?" If Yes, ask: "What is the name of the medicine(s) that you take for high blood sugar?"     no 8. OTHER SYMPTOMS: "Do you have any symptoms?" (e.g., fever, frequent urination, difficulty breathing, vomiting)     no  Protocols used: Confusion - Delirium-A-AH, Diabetes - Low Blood Sugar-A-AH

## 2023-06-02 NOTE — Telephone Encounter (Signed)
Patient wife and son are calling to request appointment with Dr Caralee Ates only Chief Complaint: low glucose levels, confusion/anger outburst Symptoms: confusion, anger, varying glucose level- to the point police have been called Frequency: has been going on for 1 year- but getting worse  Disposition: [] ED /[] Urgent Care (no appt availability in office) / [] Appointment(In office/virtual)/ []  [facility]/ [] Home Care/ [x] Refused Recommended Disposition /[] [facility]/ []  Follow-up with PCP Additional Notes: Request has been made to see Dr Caralee Ates and not PCP by wife and son- they state patient is having varying glucose levels that are making patient confused /angry. Police reports have been filed due to this. Requesting ASAP appointment but declined to see patient PCP with opening today- advised I would send request to office- no open appointment within disposition with requested provider. Call back #[phone]

## 2023-06-03 ENCOUNTER — Telehealth: Payer: Self-pay | Admitting: Family Medicine

## 2023-06-03 NOTE — Telephone Encounter (Signed)
Patient's spouse called back and I gave her Dr. Caralee Ates comments. Gavin Pound stated that she would like to change patient's PCP to Dr. Caralee Ates permanently. I advised that I was not able to schedule an appointment with another provider if PCP had availability and I would need approval to change PCP. Gavin Pound is still requesting Dr. Caralee Ates for patient's PCP at this time. Please Advise.   Margarita Mail, DO  to [facility]     06/03/23  8:09 AM Unfortunately my schedule has no openings if Dr. Carlynn Purl has a spot I would recommend that.

## 2023-06-03 NOTE — Telephone Encounter (Signed)
Copied from CRM [id number]. Topic: Referral - Request for Referral >> Jun 03, 2023  1:11 PM Clide Dales wrote: Has patient seen PCP for this complaint? Yes.   *If NO, is insurance requiring patient see PCP for this issue before PCP can refer them? Referral for which specialty: Psychiatry Preferred provider/office: [facility] Reason for referral:

## 2023-06-03 NOTE — Telephone Encounter (Signed)
I called and spoke to pt, and he has no intentions switching to Dr Caralee Ates. He verbally agreed that he loves Dr Carlynn Purl and does not want to switch PCP's

## 2023-06-04 ENCOUNTER — Other Ambulatory Visit: Payer: Self-pay | Admitting: Family Medicine

## 2023-06-04 DIAGNOSIS — F331 Major depressive disorder, recurrent, moderate: Secondary | ICD-10-CM

## 2023-06-04 DIAGNOSIS — Z638 Other specified problems related to primary support group: Secondary | ICD-10-CM

## 2023-06-04 DIAGNOSIS — F411 Generalized anxiety disorder: Secondary | ICD-10-CM

## 2023-06-09 ENCOUNTER — Other Ambulatory Visit: Payer: Self-pay

## 2023-06-09 DIAGNOSIS — Z638 Other specified problems related to primary support group: Secondary | ICD-10-CM

## 2023-06-09 DIAGNOSIS — F331 Major depressive disorder, recurrent, moderate: Secondary | ICD-10-CM

## 2023-06-09 DIAGNOSIS — F411 Generalized anxiety disorder: Secondary | ICD-10-CM

## 2023-06-09 NOTE — Telephone Encounter (Signed)
Pt called in wants referral for [facility] , instead of [facility]

## 2023-06-09 NOTE — Telephone Encounter (Signed)
Referral updated

## 2023-06-15 ENCOUNTER — Encounter: Payer: Self-pay | Admitting: Gastroenterology

## 2023-06-15 ENCOUNTER — Ambulatory Visit: Payer: Medicare Other | Admitting: Gastroenterology

## 2023-06-15 VITALS — BP 104/71 | HR 101 | Temp 98.1°F | Wt 209.0 lb

## 2023-06-15 DIAGNOSIS — R1310 Dysphagia, unspecified: Secondary | ICD-10-CM

## 2023-06-15 NOTE — Patient Instructions (Addendum)
The barium swallow test has been scheduled for September 18th, [facility].  You need to arrive at 9:30AM to register  You can not have anything to eat or drink 3 hours prior  If you need to cancel or reschedule, please call [phone]

## 2023-06-15 NOTE — Progress Notes (Signed)
Primary Care Physician: [name], MD  Primary Gastroenterologist:  Dr. Midge Minium  Chief Complaint  Patient presents with   Follow-up   Gastroesophageal Reflux    Pt denies any new concerns, doing well on Dexilant    HPI: [name] is a 62 y.o. male here with a history of anemia with an EGD and colonoscopy in the past and a capsule endoscopy without a source found for the anemia.  Capsule study showed the capsule to be retained in the stomach during the entire study.  It was recommended that if he did not respond to iron therapy that a repeat capsule study could be done. The patient takes Dexilant for his reflux and has been doing well on that.  The patient is here for a office visit prior to refilling his medication. The patient reports that he usually has dysphagia when he has a flare of his myasthenia gravis but he is not having a flare now and reports some problems with dysphagia.  The patient also has some right-sided abdominal pain that he states gets better when he lays down and states it is not associated with eating or drinking.  Past Medical History:  Diagnosis Date   Allergy    dust, seasonal (worse in the fall).   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 Followed by Pain Specialist in [place], back and neck   Asthma    BRONCHITIS   Cataract    First Dx in 2012   Chronic combined systolic and diastolic congestive heart failure (HCC)    a. 03/2018 Echo: EF 30-35%; b. 07/2018 Echo: EF 35-40%; c. 09/2019 TEE: EF 40-45%; d. 12/2020 Echo: EF 35-40%; e.01/2022  Echo: EF 45-50%. sev apical HK. Nl RV fxn. Ao root 39mm.   Coronary artery disease    a. Prior Ant MI->s/p mult stents->LAD/RCA ([place]); b. 2016 Cath: nonobs dzs;  c. 04/2018 Cath/PCI: LCX 95m (3.25x15 Sierra DES); d. 02/2022 MV: high risk; e. 02/2022 Cath: LM nl, LAD 20p ISR, patent mid-stent, LCX patent stent, OM1 nl, OM2 50p, patent stent, OM3 40p, patent stent, RCA 40p ISR, 87m ISR, 71m/d, patent distal stent, RPDA  patent stent, RPAV small, 80 (sl progression)-->Med Rx.   Deaf, left    Diabetes mellitus without complication (HCC)    TYPE 2   Diabetic peripheral neuropathy (HCC)    feet and hands   FUO (fever of unknown origin) 08/03/2018   GERD (gastroesophageal reflux disease)    Headache    muscle tension   Hyperlipidemia    Hypertension    Hyperthyroidism    Insomnia    Ischemic cardiomyopathy    a. 03/2018 Echo: EF 30-35%; b. 07/2018 Echo: EF 35-40%, Gr1 DD; c. 09/2019 TEE: EF 40-45%; d. 12/2020 Echo: EF 35-40%. GrI DD; e. 01/2022 Echo: EF 45-50%.   Knee pain, acute 05/06/2020   Left arm weakness 10/04/2019   Left leg weakness 12/01/2019   Lyme disease    Chronic   Myasthenia gravis (HCC)    (03/18/21 - no current treatment - "better than it has ever been" per pt)   Myocardial infarction (HCC) 2010   Palpitations    a. 10/2020 Zio: RSR, 88 avg. 3 brief SVT episodes (max 5 beats @ 128). Rare PACs/PVCs. Triggered events did not correlate w/ significant arrthymia - some w/ sinus tach.   Seasonal allergies    Sepsis (HCC)    a.07/2018 - unknown source. TEE neg for veg 09/2019.  Sleep apnea    CPAP   Wears hearing aid in both ears     Current Outpatient Medications  Medication Sig Dispense Refill   albuterol (PROVENTIL) (2.5 MG/3ML) 0.083% nebulizer solution Take 3 mLs (2.5 mg total) by nebulization every 6 (six) hours as needed for wheezing or shortness of breath. 75 mL 2   albuterol (VENTOLIN HFA) 108 (90 Base) MCG/ACT inhaler Inhale 2 puffs into the lungs daily.     ammonium lactate (AMLACTIN) 12 % lotion Apply 1 application topically as needed for dry skin. 400 g 3   aspirin EC 81 MG tablet Take 1 tablet (81 mg total) by mouth daily. 90 tablet 3   clopidogrel (PLAVIX) 75 MG tablet TAKE 1 TABLET BY MOUTH DAILY WITH BREAKFAST 90 tablet 0   cyclobenzaprine (FLEXERIL) 10 MG tablet TAKE 1 TABLET(10 MG) BY MOUTH TWICE DAILY AS NEEDED 30 tablet 3   dexlansoprazole (DEXILANT) 60 MG capsule Take  1 capsule (60 mg total) by mouth daily. 30 capsule 2   DULoxetine (CYMBALTA) 30 MG capsule TAKE ONE CAPSULE BY MOUTH DAILY, WITH ONE 60 MG CAPSULE 90 capsule 0   DULoxetine (CYMBALTA) 60 MG capsule TAKE 1 CAPSULE(60 MG) BY MOUTH DAILY 90 capsule 1   empagliflozin (JARDIANCE) 10 MG TABS tablet TAKE 1 TABLET(10 MG) BY MOUTH DAILY BEFORE BREAKFAST 90 tablet 3   ezetimibe (ZETIA) 10 MG tablet TAKE 1 TABLET(10 MG) BY MOUTH DAILY 90 tablet 0   fluticasone furoate-vilanterol (BREO ELLIPTA) 200-25 MCG/ACT AEPB Inhale 1 puff into the lungs daily.     gabapentin (NEURONTIN) 300 MG capsule Take 1 capsule (300 mg total) by mouth 3 (three) times daily. 90 capsule 1   gabapentin (NEURONTIN) 800 MG tablet Take 800 mg by mouth at bedtime.     Humidifier/Vaporizer Supplies (VICKS VAPORIZER SCENT) PADS Apply 1 application. topically daily as needed (cough).     insulin lispro (HUMALOG) 100 UNIT/ML KiwkPen Inject 6-10 Units into the skin 3 (three) times daily.  sliding scale 1 unit for every 10 units of carbs     Insulin Pen Needle 32G X 4 MM MISC Inject 1 each into the skin as needed.     ketoconazole (NIZORAL) 2 % cream Apply 1 Application topically daily. 60 g 0   LANTUS SOLOSTAR 100 UNIT/ML Solostar Pen Inject 20-23 Units into the skin daily at 10 pm. Sliding scale based on blood glucose  0   levocetirizine (XYZAL) 5 MG tablet TAKE 1 TABLET(5 MG) BY MOUTH EVERY EVENING 90 tablet 1   levothyroxine (SYNTHROID) 50 MCG tablet TAKE 1 TABLET BY MOUTH EVERY DAY ON MONDAY THROUGH SATURDAY, 2 TABLETS DAILY ON SUNDAY 102 tablet 0   loratadine (CLARITIN) 10 MG tablet Take 10 mg by mouth in the morning.     lubiprostone (AMITIZA) 8 MCG capsule TAKE 1 CAPSULE(8 MCG) BY MOUTH TWICE DAILY WITH A MEAL 180 capsule 0   metFORMIN (GLUCOPHAGE) 1000 MG tablet Take 1,000 mg by mouth 2 (two) times daily with a meal.     metoprolol succinate (TOPROL-XL) 25 MG 24 hr tablet Take 1 tablet (25 mg total) by mouth daily. 90 tablet 1    montelukast (SINGULAIR) 10 MG tablet Take 10 mg by mouth at bedtime.     Multiple Vitamins-Minerals (MENS MULTI VITAMIN & MINERAL PO) Take 1 tablet by mouth in the morning and at bedtime.     naloxone (NARCAN) nasal spray 4 mg/0.1 mL For excess sedation from opioids 1 kit 2   nitroGLYCERIN (  NITROSTAT) 0.3 MG SL tablet DISSOLVE 1 TABLET UNDER THE TONGUE EVERY 5 MINUTES AS NEEDED FOR CHEST PAIN, MAX 3 DOSES 25 tablet 0   Oxycodone HCl 10 MG TABS Take 1 tablet (10 mg total) by mouth in the morning, at noon, in the evening, and at bedtime. 120 tablet 0   [START ON 06/26/2023] Oxycodone HCl 10 MG TABS Take 1 tablet (10 mg total) by mouth in the morning, at noon, in the evening, and at bedtime. 120 tablet 0   rosuvastatin (CRESTOR) 5 MG tablet Take 5 mg by mouth daily with supper.  0   Semaglutide, 1 MG/DOSE, 2 MG/1.5ML SOPN Inject 1 mg into the skin daily at 12 noon.     Tiotropium Bromide Monohydrate (SPIRIVA RESPIMAT) 1.25 MCG/ACT AERS INHALE 2 PUFFS INTO THE LUNGS DAILY 12 g 0   furosemide (LASIX) 40 MG tablet Take 1 tablet (40 mg total) by mouth daily. 90 tablet 0   Current Facility-Administered Medications  Medication Dose Route Frequency Provider Last Rate Last Admin   sodium chloride flush (NS) 0.9 % injection 3 mL  3 mL Intravenous Q12H Furth, Cadence H, PA-C        Allergies as of 06/15/2023 - Review Complete 06/15/2023  Allergen Reaction Noted   Azathioprine Other (See Comments) 11/16/2018   Novolog [insulin aspart] Hives 09/24/2015    ROS:  General: Negative for anorexia, weight loss, fever, chills, fatigue, weakness. ENT: Negative for hoarseness, difficulty swallowing , nasal congestion. CV: Negative for chest pain, angina, palpitations, dyspnea on exertion, peripheral edema.  Respiratory: Negative for dyspnea at rest, dyspnea on exertion, cough, sputum, wheezing.  GI: See history of present illness. GU:  Negative for dysuria, hematuria, urinary incontinence, urinary frequency,  nocturnal urination.  Endo: Negative for unusual weight change.    Physical Examination:   BP 104/71 (BP Location: Left Arm, Patient Position: Sitting, Cuff Size: Normal)   Pulse (!) 101   Temp 98.1 F (36.7 C) (Oral)   Wt 209 lb (94.8 kg)   BMI 32.73 kg/m   General: Well-nourished, well-developed in no acute distress.  Eyes: No icterus. Conjunctivae pink. Neuro: Alert and oriented x 3.  Grossly intact. Skin: Warm and dry, no jaundice.   Psych: Alert and cooperative, normal mood and affect.  Labs:    Imaging Studies: No results found.  Assessment and Plan:   [name] is a 62 y.o. y/o male who comes in today with a history of dysphagia.  The patient says that it is usually associate with his myasthenia gravis but now has been more constant.  The patient will be set up for a barium swallow to evaluate his esophagus for any strictures or narrowing that may be causing his symptoms.  If it is positive he has been told that he may need an EGD.  The patient has been explained the plan and agrees with it.     Midge Minium, MD. Clementeen Graham    Note: This dictation was prepared with Dragon dictation along with smaller phrase technology. Any transcriptional errors that result from this process are unintentional.

## 2023-06-23 ENCOUNTER — Ambulatory Visit: Payer: Medicare Other

## 2023-06-24 ENCOUNTER — Encounter: Payer: Self-pay | Admitting: Nurse Practitioner

## 2023-06-24 ENCOUNTER — Other Ambulatory Visit: Payer: Self-pay

## 2023-06-24 ENCOUNTER — Ambulatory Visit (INDEPENDENT_AMBULATORY_CARE_PROVIDER_SITE_OTHER): Payer: Medicare Other | Admitting: Nurse Practitioner

## 2023-06-24 VITALS — BP 126/82 | HR 96 | Temp 98.2°F | Resp 16 | Ht 67.0 in | Wt 205.1 lb

## 2023-06-24 DIAGNOSIS — J45901 Unspecified asthma with (acute) exacerbation: Secondary | ICD-10-CM

## 2023-06-24 DIAGNOSIS — R051 Acute cough: Secondary | ICD-10-CM

## 2023-06-24 DIAGNOSIS — R0602 Shortness of breath: Secondary | ICD-10-CM

## 2023-06-24 DIAGNOSIS — J4540 Moderate persistent asthma, uncomplicated: Secondary | ICD-10-CM

## 2023-06-24 DIAGNOSIS — J069 Acute upper respiratory infection, unspecified: Secondary | ICD-10-CM | POA: Diagnosis not present

## 2023-06-24 MED ORDER — AZITHROMYCIN 250 MG PO TABS
ORAL_TABLET | ORAL | 0 refills | Status: AC
Start: 2023-06-24 — End: 2023-06-29

## 2023-06-24 MED ORDER — PREDNISONE 10 MG (21) PO TBPK
ORAL_TABLET | ORAL | 0 refills | Status: DC
Start: 2023-06-24 — End: 2023-07-21

## 2023-06-24 MED ORDER — BENZONATATE 100 MG PO CAPS
200.0000 mg | ORAL_CAPSULE | Freq: Two times a day (BID) | ORAL | 0 refills | Status: DC | PRN
Start: 2023-06-24 — End: 2023-07-21

## 2023-06-24 MED ORDER — PROMETHAZINE-DM 6.25-15 MG/5ML PO SYRP
5.0000 mL | ORAL_SOLUTION | Freq: Four times a day (QID) | ORAL | 0 refills | Status: DC | PRN
Start: 2023-06-24 — End: 2023-07-21

## 2023-06-24 NOTE — Progress Notes (Signed)
BP 126/82   Pulse 96   Temp 98.2 F (36.8 C) (Oral)   Resp 16   Ht 5\' 7"  (1.702 m)   Wt 205 lb 1.6 oz (93 kg)   SpO2 98%   BMI 32.12 kg/m    Subjective:    Patient ID: [name], male    DOB: 1961-08-18, 62 y.o.   MRN: [id number]  HPI: [name] is a 62 y.o. male  Chief Complaint  Patient presents with   Sore Throat   Cough    Congested, bodyache for 1 week   URI Compliant: symptoms started a week ago -Fever: no -Cough: yes -Shortness of breath: yes -Wheezing: yes -Chest congestion: yes -Nasal congestion: no -Runny nose: no -Post nasal drip: no -Sore throat: yes -Sinus pressure: no -Headache: no -Face pain: no -Ear pain:  no -Ear pressure: no -body aches: yes -Relief with OTC cold/cough medications: he has been using OTC cold medications,  has helped some  Asthma: uses albuterol and symbicort, lung sounds rhonchi and wheezing.    Lung sounds concerning for pneumonia, will send for chest xray, start azithromycin, tessalon perls, phenergan-DM and steroid taper Continue albuterol and symbicort  Relevant past medical, surgical, family and social history reviewed and updated as indicated. Interim medical history since our last visit reviewed. Allergies and medications reviewed and updated.  Review of Systems  Ten systems reviewed and is negative except as mentioned in HPI       Objective:    BP 126/82   Pulse 96   Temp 98.2 F (36.8 C) (Oral)   Resp 16   Ht 5\' 7"  (1.702 m)   Wt 205 lb 1.6 oz (93 kg)   SpO2 98%   BMI 32.12 kg/m   Wt Readings from Last 3 Encounters:  06/24/23 205 lb 1.6 oz (93 kg)  06/15/23 209 lb (94.8 kg)  05/05/23 200 lb 8 oz (90.9 kg)    Physical Exam  Constitutional: Patient appears well-developed and well-nourished. Obese  No distress.  HEENT: head atraumatic, normocephalic, pupils equal and reactive to light, ears Tms clear, neck supple, throat within normal limits Cardiovascular: Normal rate, regular rhythm  and normal heart sounds.  No murmur heard. No BLE edema. Pulmonary/Chest: Effort normal and breath sounds rhonchi and wheezing. No respiratory distress. Abdominal: Soft.  There is no tenderness. Psychiatric: Patient has a normal mood and affect. behavior is normal. Judgment and thought content normal.   Results for orders placed or performed in visit on 02/16/23  Urine Microalbumin w/creat. ratio  Result Value Ref Range   Creatinine, Urine 53 20 - 320 mg/dL   Microalb, Ur 0.2 mg/dL   Microalb Creat Ratio 4 <30 mg/g creat   *Note: Due to a large number of results and/or encounters for the requested time period, some results have not been displayed. A complete set of results can be found in Results Review.      Assessment & Plan:   Problem List Items Addressed This Visit       Respiratory   Asthma    Continue Symbicort and albuterol       Relevant Medications   predniSONE (STERAPRED UNI-PAK 21 TAB) 10 MG (21) TBPK tablet   Other Visit Diagnoses     Viral upper respiratory tract infection    -  Primary   concerning for pneumonia, will send for chest xray, start azithromycin, tessalon perls, phenergan-DM and steroid taper Continue albuterol and symbicort   Relevant Medications  benzonatate (TESSALON) 100 MG capsule   predniSONE (STERAPRED UNI-PAK 21 TAB) 10 MG (21) TBPK tablet   promethazine-dextromethorphan (PROMETHAZINE-DM) 6.25-15 MG/5ML syrup   azithromycin (ZITHROMAX) 250 MG tablet   Other Relevant Orders   DG Chest 2 View   Acute cough       concerning for pneumonia, will send for chest xray, start azithromycin, tessalon perls, phenergan-DM and steroid taper Continue albuterol and symbicort   Relevant Medications   benzonatate (TESSALON) 100 MG capsule   predniSONE (STERAPRED UNI-PAK 21 TAB) 10 MG (21) TBPK tablet   promethazine-dextromethorphan (PROMETHAZINE-DM) 6.25-15 MG/5ML syrup   azithromycin (ZITHROMAX) 250 MG tablet   Other Relevant Orders   DG Chest 2 View    Shortness of breath       concerning for pneumonia, will send for chest xray, start azithromycin, tessalon perls, phenergan-DM and steroid taper Continue albuterol and symbicort   Relevant Medications   benzonatate (TESSALON) 100 MG capsule   predniSONE (STERAPRED UNI-PAK 21 TAB) 10 MG (21) TBPK tablet   promethazine-dextromethorphan (PROMETHAZINE-DM) 6.25-15 MG/5ML syrup   azithromycin (ZITHROMAX) 250 MG tablet   Other Relevant Orders   DG Chest 2 View        Follow up plan: Return if symptoms worsen or fail to improve.

## 2023-06-24 NOTE — Assessment & Plan Note (Signed)
Continue Symbicort and albuterol

## 2023-06-25 ENCOUNTER — Ambulatory Visit
Admission: RE | Admit: 2023-06-25 | Discharge: 2023-06-25 | Disposition: A | Discharge status: Home / Self Care | Payer: Medicare Other | Attending: Nurse Practitioner | Admitting: Nurse Practitioner

## 2023-06-25 ENCOUNTER — Ambulatory Visit
Admission: RE | Admit: 2023-06-25 | Discharge: 2023-06-25 | Disposition: A | Discharge status: Home / Self Care | Payer: Medicare Other | Source: Ambulatory Visit | Attending: Nurse Practitioner | Admitting: Nurse Practitioner

## 2023-06-25 DIAGNOSIS — J069 Acute upper respiratory infection, unspecified: Secondary | ICD-10-CM | POA: Insufficient documentation

## 2023-06-25 DIAGNOSIS — R051 Acute cough: Secondary | ICD-10-CM | POA: Insufficient documentation

## 2023-06-25 DIAGNOSIS — R0602 Shortness of breath: Secondary | ICD-10-CM | POA: Insufficient documentation

## 2023-06-29 ENCOUNTER — Other Ambulatory Visit: Payer: Self-pay | Admitting: Anesthesiology

## 2023-07-07 ENCOUNTER — Other Ambulatory Visit: Payer: Self-pay | Admitting: Anesthesiology

## 2023-07-07 ENCOUNTER — Other Ambulatory Visit: Payer: Self-pay | Admitting: Cardiovascular Disease

## 2023-07-07 ENCOUNTER — Other Ambulatory Visit: Payer: Self-pay | Admitting: Family Medicine

## 2023-07-07 DIAGNOSIS — F331 Major depressive disorder, recurrent, moderate: Secondary | ICD-10-CM

## 2023-07-07 DIAGNOSIS — E063 Autoimmune thyroiditis: Secondary | ICD-10-CM

## 2023-07-08 ENCOUNTER — Telehealth: Payer: Self-pay | Admitting: Anesthesiology

## 2023-07-08 ENCOUNTER — Ambulatory Visit (INDEPENDENT_AMBULATORY_CARE_PROVIDER_SITE_OTHER): Payer: Medicare Other | Admitting: Podiatry

## 2023-07-08 DIAGNOSIS — Z91199 Patient's noncompliance with other medical treatment and regimen due to unspecified reason: Secondary | ICD-10-CM

## 2023-07-08 NOTE — Telephone Encounter (Signed)
ERROR

## 2023-07-08 NOTE — Progress Notes (Signed)
1. No-show for appointment     

## 2023-07-10 ENCOUNTER — Encounter: Payer: Self-pay | Admitting: Oncology

## 2023-07-12 ENCOUNTER — Telehealth: Payer: Self-pay | Admitting: Anesthesiology

## 2023-07-12 ENCOUNTER — Other Ambulatory Visit: Payer: Self-pay

## 2023-07-12 ENCOUNTER — Other Ambulatory Visit: Payer: Self-pay | Admitting: Family Medicine

## 2023-07-12 ENCOUNTER — Telehealth: Payer: Self-pay | Admitting: Cardiovascular Disease

## 2023-07-12 DIAGNOSIS — E063 Autoimmune thyroiditis: Secondary | ICD-10-CM

## 2023-07-12 MED ORDER — FUROSEMIDE 40 MG PO TABS: 40.0000 mg | ORAL_TABLET | Freq: Every day | ORAL | 0 refills | Status: DC

## 2023-07-12 NOTE — Telephone Encounter (Signed)
Medication Refill - Medication: levothyroxine (SYNTHROID) 50 MCG tablet   Has the patient contacted their pharmacy? yes (Agent: If yes, when and what did the pharmacy advise?)contact pcp  Preferred Pharmacy (with phone number or street name): [id number] [place], [place] - [address] AT [facility] [address] [place] [address] [place], [place] [address] Phone: [phone]  Fax: [phone]   Has the patient been seen for an appointment in the last year OR does the patient have an upcoming appointment? yes  Agent: Please be advised that RX refills may take up to 3 business days. We ask that you follow-up with your pharmacy.

## 2023-07-12 NOTE — Telephone Encounter (Signed)
Needs refill on cylobenzaprine. Dr Pernell Dupre did not refill last visit. Patient is completely out

## 2023-07-12 NOTE — Telephone Encounter (Signed)
Med refill sent to Dr Pernell Dupre.

## 2023-07-12 NOTE — Telephone Encounter (Signed)
*  STAT* If patient is at the pharmacy, call can be transferred to refill team.   1. Which medications need to be refilled? (please list name of each medication and dose if known) furosemide (LASIX) 40 MG tablet (Expired)    2. Would you like to learn more about the convenience, safety, & potential cost savings by using the [facility]? No      3. Are you open to using the [facility] (Type [facility]. No ).   4. Which pharmacy/location (including street and city if local pharmacy) is medication to be sent to? [id number] - [place],  - [address] AT [facility] OF [address] [place]    5. Do they need a 30 day or 90 day supply? 90

## 2023-07-12 NOTE — Telephone Encounter (Signed)
Requested Prescriptions   Signed Prescriptions Disp Refills  . furosemide (LASIX) 40 MG tablet 90 tablet 0    Sig: Take 1 tablet (40 mg total) by mouth daily.    Authorizing Provider: ARIDA, MUHAMMAD A    Ordering User: Danh Bayus C    

## 2023-07-13 MED ORDER — CYCLOBENZAPRINE HCL 10 MG PO TABS: ORAL_TABLET | ORAL | 3 refills | Status: DC

## 2023-07-13 NOTE — Telephone Encounter (Signed)
Requested medication (s) are due for refill today:   No  Requested medication (s) are on the active medication list:   Yes  Future visit scheduled:   No    Seen 2 wks ago Being requested too soon and TSH is due per protocol.        Requested Prescriptions  Pending Prescriptions Disp Refills   levothyroxine (SYNTHROID) 50 MCG tablet 102 tablet 0    Sig: TAKE 1 TABLET BY MOUTH EVERY DAY ON MONDAY THROUGH SATURDAY, 2 TABLETS DAILY ON SUNDAY     Endocrinology:  Hypothyroid Agents Failed - 07/12/2023  9:02 AM      Failed - TSH in normal range and within 360 days    TSH  Date Value Ref Range Status  06/24/2022 0.49 0.41 - 5.90 Final  06/05/2022 1.015 0.350 - 4.500 uIU/mL Final    Comment:    Performed by a 3rd Generation assay with a functional sensitivity of <=0.01 uIU/mL. Performed at [facility], [address] [place], [place] [address]   09/23/2021 2.18 0.40 - 4.50 mIU/L Final         Passed - Valid encounter within last 12 months    Recent Outpatient Visits           2 weeks ago Viral upper respiratory tract infection   [facility] Berniece Salines, FNP   3 months ago Myasthenia gravis [facility])   [facility] [name], MD   4 months ago Type 2 diabetes mellitus with diabetic polyneuropathy, with long-term current use of insulin [facility])   [facility] [place], Danna Hefty, MD   6 months ago Moderate asthma with acute exacerbation, unspecified whether persistent   [facility] Margarita Mail, DO   7 months ago OSA on CPAP   [facility] [place], [place]

## 2023-07-14 ENCOUNTER — Other Ambulatory Visit: Payer: Self-pay | Admitting: Family Medicine

## 2023-07-14 DIAGNOSIS — F411 Generalized anxiety disorder: Secondary | ICD-10-CM

## 2023-07-14 DIAGNOSIS — F331 Major depressive disorder, recurrent, moderate: Secondary | ICD-10-CM

## 2023-07-19 ENCOUNTER — Encounter: Payer: Self-pay | Admitting: Anesthesiology

## 2023-07-19 ENCOUNTER — Ambulatory Visit: Payer: Medicare Other | Attending: Anesthesiology | Admitting: Anesthesiology

## 2023-07-19 DIAGNOSIS — G894 Chronic pain syndrome: Secondary | ICD-10-CM

## 2023-07-19 DIAGNOSIS — M4716 Other spondylosis with myelopathy, lumbar region: Secondary | ICD-10-CM | POA: Diagnosis not present

## 2023-07-19 DIAGNOSIS — F1190 Opioid use, unspecified, uncomplicated: Secondary | ICD-10-CM | POA: Diagnosis not present

## 2023-07-19 DIAGNOSIS — M47817 Spondylosis without myelopathy or radiculopathy, lumbosacral region: Secondary | ICD-10-CM

## 2023-07-19 DIAGNOSIS — M545 Low back pain, unspecified: Secondary | ICD-10-CM

## 2023-07-19 DIAGNOSIS — R29898 Other symptoms and signs involving the musculoskeletal system: Secondary | ICD-10-CM

## 2023-07-19 DIAGNOSIS — M5432 Sciatica, left side: Secondary | ICD-10-CM

## 2023-07-19 MED ORDER — OXYCODONE HCL 10 MG PO TABS
10.0000 mg | ORAL_TABLET | Freq: Four times a day (QID) | ORAL | 0 refills | Status: DC
Start: 2023-08-26 — End: 2023-09-21

## 2023-07-19 MED ORDER — OXYCODONE HCL 10 MG PO TABS: 10.0000 mg | ORAL_TABLET | Freq: Four times a day (QID) | ORAL | 0 refills | Status: AC

## 2023-07-20 ENCOUNTER — Telehealth: Payer: Self-pay | Admitting: Cardiovascular Disease

## 2023-07-20 NOTE — Progress Notes (Signed)
Virtual Visit via Telephone Note  I connected with [name] on 07/20/23 at  2:00 PM EDT by telephone and verified that I am speaking with the correct person using two identifiers.  Location: Patient: Home Provider: [facility]  the limitations, risks, security and privacy concerns of performing an evaluation and management service by telephone and the availability of in person appointments. I also discussed with the patient that there may be a patient responsible charge related to this service. The patient expressed understanding and agreed to proceed.   History of Present Illness:   I discussed I spoke with [name] via telephone as we were unable like for the video portion of the conference.  He reports that his low back pain has been more significant lately.  He had previous epidurals back in March and June of this year which gave him significant improvement in his low back pain and that same sciatica symptom is recurring at this time.  As per usual it generally affects the left side greater than right and has been winding up.  Following his last epidural back in June he got about 3 months of relief rated at 75% improvement in his low back and he gets about 2 months of near complete relief of the sciatica symptom.  Nothing else is as effective as the epidurals as he reports today.  He still takes his opioid medications but they are less effective as the epidural benefit wears out.  He still gets about 40 to 50% relief with the oxycodone but this is more like 75 to 80% relief when he has had previous epidural.  No change in lower extremity strength or function is noted other than some chronic left lower extremity weakness.  Bowel bladder function has been stable.  Review of systems: General: No fevers or chills Pulmonary: No shortness of breath or dyspnea Cardiac: No angina or palpitations or lightheadedness GI: No abdominal pain or constipation Psych: No depression     Observations/Objective:  Current Outpatient Medications:    [START ON 08/26/2023] Oxycodone HCl 10 MG TABS, Take 1 tablet (10 mg total) by mouth in the morning, at noon, in the evening, and at bedtime., Disp: 120 tablet, Rfl: 0   albuterol (PROVENTIL) (2.5 MG/3ML) 0.083% nebulizer solution, Take 3 mLs (2.5 mg total) by nebulization every 6 (six) hours as needed for wheezing or shortness of breath., Disp: 75 mL, Rfl: 2   albuterol (VENTOLIN HFA) 108 (90 Base) MCG/ACT inhaler, Inhale 2 puffs into the lungs daily., Disp: , Rfl:    ammonium lactate (AMLACTIN) 12 % lotion, Apply 1 application topically as needed for dry skin., Disp: 400 g, Rfl: 3   aspirin EC 81 MG tablet, Take 1 tablet (81 mg total) by mouth daily., Disp: 90 tablet, Rfl: 3   benzonatate (TESSALON) 100 MG capsule, Take 2 capsules (200 mg total) by mouth 2 (two) times daily as needed for cough., Disp: 20 capsule, Rfl: 0   clopidogrel (PLAVIX) 75 MG tablet, TAKE 1 TABLET BY MOUTH DAILY WITH BREAKFAST, Disp: 90 tablet, Rfl: 0   cyclobenzaprine (FLEXERIL) 10 MG tablet, TAKE 1 TABLET(10 MG) BY MOUTH TWICE DAILY AS NEEDED, Disp: 30 tablet, Rfl: 3   dexlansoprazole (DEXILANT) 60 MG capsule, Take 1 capsule (60 mg total) by mouth daily., Disp: 30 capsule, Rfl: 2   DULoxetine (CYMBALTA) 30 MG capsule, TAKE 1 CAPSULE BY MOUTH EVERY DAY, WITH ONE 60 MG CAPSULE, Disp: 90 capsule, Rfl: 0   DULoxetine (CYMBALTA) 60  MG capsule, TAKE 1 CAPSULE(60 MG) BY MOUTH DAILY, Disp: 90 capsule, Rfl: 1   empagliflozin (JARDIANCE) 10 MG TABS tablet, TAKE 1 TABLET(10 MG) BY MOUTH DAILY BEFORE BREAKFAST, Disp: 90 tablet, Rfl: 3   ezetimibe (ZETIA) 10 MG tablet, TAKE 1 TABLET(10 MG) BY MOUTH DAILY, Disp: 90 tablet, Rfl: 0   fluticasone furoate-vilanterol (BREO ELLIPTA) 200-25 MCG/ACT AEPB, Inhale 1 puff into the lungs daily., Disp: , Rfl:    furosemide (LASIX) 40 MG tablet, Take 1 tablet (40 mg total) by mouth daily., Disp: 90 tablet, Rfl: 0   gabapentin (NEURONTIN)  300 MG capsule, Take 1 capsule (300 mg total) by mouth 3 (three) times daily., Disp: 90 capsule, Rfl: 1   gabapentin (NEURONTIN) 800 MG tablet, Take 800 mg by mouth at bedtime., Disp: , Rfl:    insulin lispro (HUMALOG) 100 UNIT/ML KiwkPen, Inject 6-10 Units into the skin 3 (three) times daily.  sliding scale 1 unit for every 10 units of carbs, Disp: , Rfl:    ketoconazole (NIZORAL) 2 % cream, Apply 1 Application topically daily., Disp: 60 g, Rfl: 0   LANTUS SOLOSTAR 100 UNIT/ML Solostar Pen, Inject 20-23 Units into the skin daily at 10 pm. Sliding scale based on blood glucose, Disp: , Rfl: 0   levocetirizine (XYZAL) 5 MG tablet, TAKE 1 TABLET(5 MG) BY MOUTH EVERY EVENING, Disp: 90 tablet, Rfl: 1   levothyroxine (SYNTHROID) 50 MCG tablet, TAKE 1 TABLET BY MOUTH EVERY DAY ON MONDAY THROUGH SATURDAY, 2 TABLETS DAILY ON SUNDAY, Disp: 102 tablet, Rfl: 0   loratadine (CLARITIN) 10 MG tablet, Take 10 mg by mouth in the morning., Disp: , Rfl:    lubiprostone (AMITIZA) 8 MCG capsule, TAKE 1 CAPSULE(8 MCG) BY MOUTH TWICE DAILY WITH A MEAL, Disp: 180 capsule, Rfl: 0   metFORMIN (GLUCOPHAGE) 1000 MG tablet, Take 1,000 mg by mouth 2 (two) times daily with a meal., Disp: , Rfl:    metoprolol succinate (TOPROL-XL) 25 MG 24 hr tablet, Take 1 tablet (25 mg total) by mouth daily., Disp: 90 tablet, Rfl: 1   montelukast (SINGULAIR) 10 MG tablet, Take 10 mg by mouth at bedtime., Disp: , Rfl:    Multiple Vitamins-Minerals (MENS MULTI VITAMIN & MINERAL PO), Take 1 tablet by mouth in the morning and at bedtime., Disp: , Rfl:    naloxone (NARCAN) nasal spray 4 mg/0.1 mL, For excess sedation from opioids, Disp: 1 kit, Rfl: 2   nitroGLYCERIN (NITROSTAT) 0.3 MG SL tablet, DISSOLVE 1 TABLET UNDER THE TONGUE EVERY 5 MINUTES AS NEEDED FOR CHEST PAIN, MAX 3 DOSES, Disp: 25 tablet, Rfl: 0   [START ON 07/27/2023] Oxycodone HCl 10 MG TABS, Take 1 tablet (10 mg total) by mouth in the morning, at noon, in the evening, and at bedtime.,  Disp: 120 tablet, Rfl: 0   predniSONE (STERAPRED UNI-PAK 21 TAB) 10 MG (21) TBPK tablet, Take as directed on package.  (60 mg po on day 1, 50 mg po on day 2...), Disp: 21 tablet, Rfl: 0   promethazine-dextromethorphan (PROMETHAZINE-DM) 6.25-15 MG/5ML syrup, Take 5 mLs by mouth 4 (four) times daily as needed for cough., Disp: 118 mL, Rfl: 0   rosuvastatin (CRESTOR) 5 MG tablet, Take 5 mg by mouth daily with supper., Disp: , Rfl: 0   Semaglutide, 1 MG/DOSE, 2 MG/1.5ML SOPN, Inject 1 mg into the skin daily at 12 noon., Disp: , Rfl:    Tiotropium Bromide Monohydrate (SPIRIVA RESPIMAT) 1.25 MCG/ACT AERS, INHALE 2 PUFFS INTO THE LUNGS DAILY, Disp:  12 g, Rfl: 0  Current Facility-Administered Medications:    sodium chloride flush (NS) 0.9 % injection 3 mL, 3 mL, Intravenous, Q12H, Furth, Cadence H, PA-C   Past Medical History:  Diagnosis Date   Allergy    dust, seasonal (worse in the fall).   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 Followed by Pain Specialist in [place], back and neck   Asthma    BRONCHITIS   Cataract    First Dx in 2012   Chronic combined systolic and diastolic congestive heart failure (HCC)    a. 03/2018 Echo: EF 30-35%; b. 07/2018 Echo: EF 35-40%; c. 09/2019 TEE: EF 40-45%; d. 12/2020 Echo: EF 35-40%; e.01/2022  Echo: EF 45-50%. sev apical HK. Nl RV fxn. Ao root 39mm.   Coronary artery disease    a. Prior Ant MI->s/p mult stents->LAD/RCA ([place]); b. 2016 Cath: nonobs dzs;  c. 04/2018 Cath/PCI: LCX 1m (3.25x15 Sierra DES); d. 02/2022 MV: high risk; e. 02/2022 Cath: LM nl, LAD 20p ISR, patent mid-stent, LCX patent stent, OM1 nl, OM2 50p, patent stent, OM3 40p, patent stent, RCA 40p ISR, 19m ISR, 33m/d, patent distal stent, RPDA patent stent, RPAV small, 80 (sl progression)-->Med Rx.   Deaf, left    Diabetes mellitus without complication (HCC)    TYPE 2   Diabetic peripheral neuropathy (HCC)    feet and hands   FUO (fever of unknown origin) 08/03/2018   GERD (gastroesophageal reflux  disease)    Headache    muscle tension   Hyperlipidemia    Hypertension    Hyperthyroidism    Insomnia    Ischemic cardiomyopathy    a. 03/2018 Echo: EF 30-35%; b. 07/2018 Echo: EF 35-40%, Gr1 DD; c. 09/2019 TEE: EF 40-45%; d. 12/2020 Echo: EF 35-40%. GrI DD; e. 01/2022 Echo: EF 45-50%.   Knee pain, acute 05/06/2020   Left arm weakness 10/04/2019   Left leg weakness 12/01/2019   Lyme disease    Chronic   Myasthenia gravis (HCC)    (03/18/21 - no current treatment - "better than it has ever been" per pt)   Myocardial infarction (HCC) 2010   Palpitations    a. 10/2020 [facility]: RSR, 88 avg. 3 brief SVT episodes (max 5 beats @ 128). Rare PACs/PVCs. Triggered events did not correlate w/ significant arrthymia - some w/ sinus tach.   Seasonal allergies    Sepsis (HCC)    a.07/2018 - unknown source. TEE neg for veg 09/2019.   Sleep apnea    CPAP   Wears hearing aid in both ears    Assessment and Plan:  1. Sciatica, left side   2. Chronic, continuous use of opioids   3. Chronic pain syndrome   4. Lumbar spondylosis with myelopathy   5. Facet arthritis of lumbosacral region   6. Left leg weakness   7. Low back pain at multiple sites    Based on our conversation think it is appropriate to refill his medicines for the next 2 months.  I have reviewed the [facility] practitioner database information I think it is appropriate for this.  Unfortunately he has failed more conservative therapy but chronic opioid therapy seems to continue to work well for him.  I do think that a repeat epidural would be beneficial and he is requesting this.  Will schedule this for the next 3 to 4 weeks.  We gone over the risks and benefits of the procedure in full detail.  Continue core stretching strengthening  exercises and follow-up with his primary care physician for baseline medical care with return to clinic in 2 months. Follow Up Instructions:    I discussed the assessment and treatment plan with the patient.  The patient was provided an opportunity to ask questions and all were answered. The patient agreed with the plan and demonstrated an understanding of the instructions.   The patient was advised to call back or seek an in-person evaluation if the symptoms worsen or if the condition fails to improve as anticipated.  I provided 30 minutes of non-face-to-face time during this encounter.   Yevette Edwards, MD

## 2023-07-20 NOTE — Telephone Encounter (Signed)
Spoke w/ pt.  He reports his wt has been fluctuating, up 3 lbs one day, down 3 lbs the next. He reports low BP when going from sitting to standing, has had some "near misses" of passing out and actually passed out in the yard on Sunday. He fell on his butt and his neighbor helped him back inside; he was not hurt. He did not check his BP at that time. He also reports increased episodes of angina that seem to be greater than in the past. He does take nitroglycerin when symptomatic, usually 1 will resolve pain, but has recently had to take 2. BP is typically 120/80s on the high side, lowest readings have been 100/65/70. Pt takes lasix 40 mg daily in the am. Does not have any swelling, but does report SOB. Pt adamantly limites fluids to 64 oz per day, does not salt food and only goes out to eat once a month. Pt had ECHO 01/07/23, stress test and cath in April and May of 2023.  Per ov 05/05/23 :  He had increased angina in 2023 and underwent cardiac catheterization in May, 2023 which showed moderate nonobstructive disease with patent stents.  There was progression of right posterior AV groove stenosis to 80% supplying a small distribution.  I recommended medical therapy.   Pt would like to know what steps to take next. Advised him I will make Dr. Kirke Corin aware of his concerns and call him back w/ his recommendations.

## 2023-07-20 NOTE — Telephone Encounter (Signed)
Pt called in stating his weight has been going up and down about 3 lbs a day. He states he has not noticed he is swelling anywhere. He states he had an episode where he blacked out, he believes his BP was very low. He also stated he has had some increased angina. Please advise.

## 2023-07-21 ENCOUNTER — Encounter: Payer: Self-pay | Admitting: Psychiatry

## 2023-07-21 ENCOUNTER — Ambulatory Visit: Payer: Medicare Other | Admitting: Psychiatry

## 2023-07-21 VITALS — BP 116/73 | HR 96 | Ht 67.0 in | Wt 208.0 lb

## 2023-07-21 DIAGNOSIS — F439 Reaction to severe stress, unspecified: Secondary | ICD-10-CM

## 2023-07-21 NOTE — Telephone Encounter (Signed)
That is too much to handle over the phone.  Recommend scheduling an office visit with APP.  He will need to get his orthostatic blood pressure checked.
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
## 2023-07-21 NOTE — Patient Instructions (Signed)
  [email or web address]  [email or web address]  [email or web address] [facility] [address], [place], [place] [address]   [phone]  [facility] [email or web address] [address], [place], [place] [address]  [phone]  [facility], [facility] [email or web address] [address], [place], [place] [address]  [phone]   [facility] - [phone]  [facility] - [phone]  [facility] - [phone] counseling [phone]  [facility] - [phone]   Dr. Liborio Nixon with the [facility] specializes in divorce  [facility] has interns who offer sliding scale rates and some of the full time clinicians do, as well. You complete their contact form on their website and the referrals coordinator will help to get connected to someone   Medicaid below :  [facility] [address] [place], [place] [address]  [phone]    [employer] [address] [place], [place] [address]  [phone]    [facility] [address] [place], [place] [address]  [phone]

## 2023-07-23 ENCOUNTER — Ambulatory Visit (INDEPENDENT_AMBULATORY_CARE_PROVIDER_SITE_OTHER): Payer: Medicare Other

## 2023-07-23 ENCOUNTER — Other Ambulatory Visit: Payer: Self-pay | Admitting: Family Medicine

## 2023-07-23 VITALS — Ht 67.0 in | Wt 208.0 lb

## 2023-07-23 DIAGNOSIS — E063 Autoimmune thyroiditis: Secondary | ICD-10-CM

## 2023-07-23 DIAGNOSIS — Z0000 Encounter for general adult medical examination without abnormal findings: Secondary | ICD-10-CM | POA: Diagnosis not present

## 2023-07-23 MED ORDER — AMMONIUM LACTATE 12 % EX LOTN: 1.0000 | TOPICAL_LOTION | CUTANEOUS | 3 refills | Status: AC | PRN

## 2023-07-23 MED ORDER — LEVOTHYROXINE SODIUM 50 MCG PO TABS
ORAL_TABLET | ORAL | 0 refills | Status: DC
Start: 2023-07-23 — End: 2023-12-02

## 2023-07-23 NOTE — Patient Instructions (Signed)
[name] , Thank you for taking time to come for your Medicare Wellness Visit. I appreciate your ongoing commitment to your health goals. Please review the following plan we discussed and let me know if I can assist you in the future.   Referrals/Orders/Follow-Ups/Clinician Recommendations: none  This is a list of the screening recommended for you and due dates:  Health Maintenance  Topic Date Due   COVID-19 Vaccine (6 - 2023-24 season) 08/08/2023*   Hemoglobin A1C  08/23/2023*   Zoster (Shingles) Vaccine (1 of 2) 10/23/2023*   Flu Shot  01/03/2024*   DTaP/Tdap/Td vaccine (2 - Tdap) 07/22/2024*   Eye exam for diabetics  10/02/2023   Yearly kidney function blood test for diabetes  11/26/2023   Yearly kidney health urinalysis for diabetes  02/16/2024   Complete foot exam   02/25/2024   Medicare Annual Wellness Visit  07/22/2024   Colon Cancer Screening  04/14/2026   Hepatitis C Screening  Completed   HIV Screening  Completed   HPV Vaccine  Aged Out  *Topic was postponed. The date shown is not the original due date.    Advanced directives: (Copy Requested) Please bring a copy of your health care power of attorney and living will to the office to be added to your chart at your convenience.  Next Medicare Annual Wellness Visit scheduled for next year: Yes 07/28/2024 @1pm  telephone

## 2023-07-23 NOTE — Telephone Encounter (Signed)
Request refilled 07/23/23, duplicate request.  Requested Prescriptions  Pending Prescriptions Disp Refills   levothyroxine (SYNTHROID) 50 MCG tablet [Pharmacy Med Name: LEVOTHYROXINE 0.05MG  ( ) TAB] 103 tablet     Sig: TAKE 1 TABLET BY MOUTH EVERY DAY ON MONDAY THROUGH SATURDAYS, THEN 2 TABLETS DAILY ON SUNDAYS.     Endocrinology:  Hypothyroid Agents Failed - 07/23/2023  2:38 PM      Failed - TSH in normal range and within 360 days    TSH  Date Value Ref Range Status  06/24/2022 0.49 0.41 - 5.90 Final  06/05/2022 1.015 0.350 - 4.500 uIU/mL Final    Comment:    Performed by a 3rd Generation assay with a functional sensitivity of <=0.01 uIU/mL. Performed at [facility], [address] [place], [place] [address]   09/23/2021 2.18 0.40 - 4.50 mIU/L Final         Passed - Valid encounter within last 12 months    Recent Outpatient Visits           4 weeks ago Viral upper respiratory tract infection   [facility] Berniece Salines, FNP   4 months ago Myasthenia gravis [facility])   [facility] [name], MD   5 months ago Type 2 diabetes mellitus with diabetic polyneuropathy, with long-term current use of insulin [facility])   [facility] Stafford, Danna Hefty, MD   7 months ago Moderate asthma with acute exacerbation, unspecified whether persistent   [facility] Margarita Mail, DO   7 months ago OSA on CPAP   [facility] Caro Laroche, DO       Future Appointments             In 2 weeks Brion Aliment, Dois Davenport, NP [facility]

## 2023-07-23 NOTE — Progress Notes (Signed)
Subjective:   [name] is a 62 y.o. male who presents for Medicare Annual/Subsequent preventive examination.  Visit Complete: Virtual I connected with  [name] on 07/23/23 by a audio enabled telemedicine application and verified that I am speaking with the correct person using two identifiers.  Patient Location: Home  Provider Location: Office/Clinic  I discussed the limitations of evaluation and management by telemedicine. The patient expressed understanding and agreed to proceed.  Vital Signs: Because this visit was a virtual/telehealth visit, some criteria may be missing or patient reported. Any vitals not documented were not able to be obtained and vitals that have been documented are patient reported.  Patient Medicare AWV questionnaire was completed by the patient on 07/20/23; I have confirmed that all information answered by patient is correct and no changes since this date.  Cardiac Risk Factors include: advanced age (>34men, >60 women);diabetes mellitus;dyslipidemia;hypertension;male gender;obesity (BMI >30kg/m2);sedentary lifestyle     Objective:    Today's Vitals   07/20/23 1459 07/23/23 1325  Weight:  208 lb (94.3 kg)  Height:  5\' 7"  (1.702 m)  PainSc: 6     Body mass index is 32.58 kg/m.     07/23/2023    1:38 PM 07/21/2023    1:14 PM 03/24/2023    1:40 PM 08/17/2022   12:59 PM 12/24/2021    1:47 PM 11/10/2021   12:50 PM 08/13/2021   11:29 AM  Advanced Directives  Does Patient Have a Medical Advance Directive? Yes  Yes Yes No Yes Yes  Type of Estate agent of [place];Living will        Copy of Healthcare Power of Attorney in Chart? No - copy requested        Would patient like information on creating a medical advance directive?            Information is confidential and restricted. Go to Review Flowsheets to unlock data.    Current Medications (verified) Outpatient Encounter Medications as of 07/23/2023  Medication  Sig   albuterol (PROVENTIL) (2.5 MG/3ML) 0.083% nebulizer solution Take 3 mLs (2.5 mg total) by nebulization every 6 (six) hours as needed for wheezing or shortness of breath.   albuterol (VENTOLIN HFA) 108 (90 Base) MCG/ACT inhaler Inhale 2 puffs into the lungs daily.   ammonium lactate (AMLACTIN) 12 % lotion Apply 1 application topically as needed for dry skin.   aspirin EC 81 MG tablet Take 1 tablet (81 mg total) by mouth daily.   clopidogrel (PLAVIX) 75 MG tablet TAKE 1 TABLET BY MOUTH DAILY WITH BREAKFAST   cyclobenzaprine (FLEXERIL) 10 MG tablet TAKE 1 TABLET(10 MG) BY MOUTH TWICE DAILY AS NEEDED   dexlansoprazole (DEXILANT) 60 MG capsule Take 1 capsule (60 mg total) by mouth daily.   DULoxetine (CYMBALTA) 30 MG capsule TAKE 1 CAPSULE BY MOUTH EVERY DAY, WITH ONE 60 MG CAPSULE   DULoxetine (CYMBALTA) 60 MG capsule TAKE 1 CAPSULE(60 MG) BY MOUTH DAILY   empagliflozin (JARDIANCE) 10 MG TABS tablet TAKE 1 TABLET(10 MG) BY MOUTH DAILY BEFORE BREAKFAST   ezetimibe (ZETIA) 10 MG tablet TAKE 1 TABLET(10 MG) BY MOUTH DAILY   fluticasone furoate-vilanterol (BREO ELLIPTA) 200-25 MCG/ACT AEPB Inhale 1 puff into the lungs daily.   furosemide (LASIX) 40 MG tablet Take 1 tablet (40 mg total) by mouth daily.   gabapentin (NEURONTIN) 300 MG capsule Take 1 capsule (300 mg total) by mouth 3 (three) times daily.   gabapentin (NEURONTIN) 800 MG tablet Take 800 mg  by mouth at bedtime.   insulin lispro (HUMALOG) 100 UNIT/ML KiwkPen Inject 6-10 Units into the skin 3 (three) times daily.  sliding scale 1 unit for every 10 units of carbs   ketoconazole (NIZORAL) 2 % cream Apply 1 Application topically daily.   LANTUS SOLOSTAR 100 UNIT/ML Solostar Pen Inject 20-23 Units into the skin daily at 10 pm. Sliding scale based on blood glucose   levocetirizine (XYZAL) 5 MG tablet TAKE 1 TABLET(5 MG) BY MOUTH EVERY EVENING   levothyroxine (SYNTHROID) 50 MCG tablet TAKE 1 TABLET BY MOUTH EVERY DAY ON MONDAY THROUGH SATURDAY,  2 TABLETS DAILY ON SUNDAY   loratadine (CLARITIN) 10 MG tablet Take 10 mg by mouth in the morning.   lubiprostone (AMITIZA) 8 MCG capsule TAKE 1 CAPSULE(8 MCG) BY MOUTH TWICE DAILY WITH A MEAL   metFORMIN (GLUCOPHAGE) 1000 MG tablet Take 1,000 mg by mouth 2 (two) times daily with a meal.   metoprolol succinate (TOPROL-XL) 25 MG 24 hr tablet Take 1 tablet (25 mg total) by mouth daily.   montelukast (SINGULAIR) 10 MG tablet Take 10 mg by mouth at bedtime.   Multiple Vitamins-Minerals (MENS MULTI VITAMIN & MINERAL PO) Take 1 tablet by mouth in the morning and at bedtime.   naloxone (NARCAN) nasal spray 4 mg/0.1 mL For excess sedation from opioids   nitroGLYCERIN (NITROSTAT) 0.3 MG SL tablet DISSOLVE 1 TABLET UNDER THE TONGUE EVERY 5 MINUTES AS NEEDED FOR CHEST PAIN, MAX 3 DOSES   [START ON 07/27/2023] Oxycodone HCl 10 MG TABS Take 1 tablet (10 mg total) by mouth in the morning, at noon, in the evening, and at bedtime.   [START ON 08/26/2023] Oxycodone HCl 10 MG TABS Take 1 tablet (10 mg total) by mouth in the morning, at noon, in the evening, and at bedtime.   rosuvastatin (CRESTOR) 5 MG tablet Take 5 mg by mouth daily with supper.   Semaglutide, 1 MG/DOSE, 2 MG/1.5ML SOPN Inject 1 mg into the skin daily at 12 noon.   Tiotropium Bromide Monohydrate (SPIRIVA RESPIMAT) 1.25 MCG/ACT AERS INHALE 2 PUFFS INTO THE LUNGS DAILY   Facility-Administered Encounter Medications as of 07/23/2023  Medication   sodium chloride flush (NS) 0.9 % injection 3 mL    Allergies (verified) Azathioprine and Novolog [insulin aspart]   History: Past Medical History:  Diagnosis Date   Allergy    dust, seasonal (worse in the fall).   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 Followed by Pain Specialist in [place], back and neck   Asthma    BRONCHITIS   Cataract    First Dx in 2012   Chronic combined systolic and diastolic congestive heart failure (HCC)    a. 03/2018 Echo: EF 30-35%; b. 07/2018 Echo: EF 35-40%; c.  09/2019 TEE: EF 40-45%; d. 12/2020 Echo: EF 35-40%; e.01/2022  Echo: EF 45-50%. sev apical HK. Nl RV fxn. Ao root 39mm.   Coronary artery disease    a. Prior Ant MI->s/p mult stents->LAD/RCA ([place]); b. 2016 Cath: nonobs dzs;  c. 04/2018 Cath/PCI: LCX 64m (3.25x15 Sierra DES); d. 02/2022 MV: high risk; e. 02/2022 Cath: LM nl, LAD 20p ISR, patent mid-stent, LCX patent stent, OM1 nl, OM2 50p, patent stent, OM3 40p, patent stent, RCA 40p ISR, 41m ISR, 90m/d, patent distal stent, RPDA patent stent, RPAV small, 80 (sl progression)-->Med Rx.   Deaf, left    Diabetes mellitus without complication (HCC)    TYPE 2   Diabetic peripheral neuropathy (HCC)  feet and hands   FUO (fever of unknown origin) 08/03/2018   GERD (gastroesophageal reflux disease)    Headache    muscle tension   Hyperlipidemia    Hypertension    Hyperthyroidism    Insomnia    Ischemic cardiomyopathy    a. 03/2018 Echo: EF 30-35%; b. 07/2018 Echo: EF 35-40%, Gr1 DD; c. 09/2019 TEE: EF 40-45%; d. 12/2020 Echo: EF 35-40%. GrI DD; e. 01/2022 Echo: EF 45-50%.   Knee pain, acute 05/06/2020   Left arm weakness 10/04/2019   Left leg weakness 12/01/2019   Lyme disease    Chronic   Myasthenia gravis (HCC)    (03/18/21 - no current treatment - "better than it has ever been" per pt)   Myocardial infarction (HCC) 2010   Palpitations    a. 10/2020 Zio: RSR, 88 avg. 3 brief SVT episodes (max 5 beats @ 128). Rare PACs/PVCs. Triggered events did not correlate w/ significant arrthymia - some w/ sinus tach.   Seasonal allergies    Sepsis (HCC)    a.07/2018 - unknown source. TEE neg for veg 09/2019.   Sleep apnea    CPAP   Wears hearing aid in both ears    Past Surgical History:  Procedure Laterality Date   BILATERAL CARPAL TUNNEL RELEASE Bilateral L in 2012 and R in 2013   CARDIAC CATHETERIZATION     Several Caths, most recent in  March 2016.   COLONOSCOPY WITH PROPOFOL N/A 01/10/2016   Procedure: COLONOSCOPY WITH PROPOFOL;  Surgeon: Midge Minium,  MD;  Location: [facility] ENDOSCOPY;  Service: Endoscopy;  Laterality: N/A;   COLONOSCOPY WITH PROPOFOL N/A 04/14/2021   Procedure: COLONOSCOPY WITH PROPOFOL;  Surgeon: Midge Minium, MD;  Location: [facility];  Service: Endoscopy;  Laterality: N/A;  Diabetic - insulin and oral meds   CORONARY ANGIOPLASTY     CORONARY STENT INTERVENTION N/A 04/25/2018   Procedure: CORONARY STENT INTERVENTION;  Surgeon: Iran Ouch, MD;  Location: [facility] INVASIVE CV LAB;  Service: Cardiovascular;  Laterality: N/A;   ESOPHAGOGASTRODUODENOSCOPY (EGD) WITH PROPOFOL N/A 01/10/2016   Procedure: ESOPHAGOGASTRODUODENOSCOPY (EGD) WITH PROPOFOL;  Surgeon: Midge Minium, MD;  Location: [facility] ENDOSCOPY;  Service: Endoscopy;  Laterality: N/A;   ESOPHAGOGASTRODUODENOSCOPY (EGD) WITH PROPOFOL N/A 04/14/2021   Procedure: ESOPHAGOGASTRODUODENOSCOPY (EGD) WITH BIOPSY;  Surgeon: Midge Minium, MD;  Location: [facility];  Service: Endoscopy;  Laterality: N/A;   EYE SURGERY Bilateral 2012   cataract/bilateral vitrectomies   GIVENS CAPSULE STUDY N/A 08/20/2021   Procedure: GIVENS CAPSULE STUDY;  Surgeon: Midge Minium, MD;  Location: [facility] END[facility]COPY;  Service: Endoscopy;  Laterality: N/A;   LEFT HEART CATH AND CORONARY ANGIOGRAPHY Left 04/25/2018   Procedure: LEFT HEART CATH AND CORONARY ANGIOGRAPHY;  Surgeon: Iran Ouch, MD;  Location: [facility] INVASIVE CV LAB;  Service: Cardiovascular;  Laterality: Left;   LEFT HEART CATH AND CORONARY ANGIOGRAPHY Left 02/02/2022   Procedure: LEFT HEART CATH AND CORONARY ANGIOGRAPHY;  Surgeon: Iran Ouch, MD;  Location: [facility] INVASIVE CV LAB;  Service: Cardiovascular;  Laterality: Left;   TEE WITHOUT CARDIOVERSION N/A 09/05/2018   Procedure: TRANSESOPHAGEAL ECHOCARDIOGRAM (TEE);  Surgeon: Iran Ouch, MD;  Location: [facility] ORS;  Service: Cardiovascular;  Laterality: N/A;   TONSILLECTOMY AND ADENOIDECTOMY     As a child   TUNNELED VENOUS CATHETER PLACEMENT     removed   Family  History  Problem Relation Age of Onset   Anxiety disorder Mother    Depression Mother    Diabetes Mother  Heart disease Mother    Cancer Father        Prostate CA, Anal cancer    Dementia Father    Diabetes Brother    Healthy Brother    Healthy Brother    Social History   Socioeconomic History   Marital status: Married    Spouse name: Gavin Pound   Number of children: 0   Years of education: Not on file   Highest education level: Doctorate  Occupational History   Occupation: disabled    Comment: multiple medical problems - first approved for uncontrolled DM, but now has heart disease and Myasthenia Gravis   Tobacco Use   Smoking status: Never   Smokeless tobacco: Never   Tobacco comments:    smoking cessation materials not required  Vaping Use   Vaping status: Never Used  Substance and Sexual Activity   Alcohol use: Not Currently    Alcohol/week: 0.0 standard drinks of alcohol   Drug use: No   Sexual activity: Not Currently    Partners: Female  Other Topics Concern   Not on file  Social History Narrative   Not on file   Social Determinants of Health   Financial Resource Strain: Medium Risk (07/20/2023)   Overall Financial Resource Strain (CARDIA)    Difficulty of Paying Living Expenses: Somewhat hard  Food Insecurity: Food Insecurity Present (07/20/2023)   Hunger Vital Sign    Worried About Running Out of Food in the Last Year: Never true    Ran Out of Food in the Last Year: Sometimes true  Transportation Needs: No Transportation Needs (07/20/2023)   PRAPARE - Administrator, Civil Service (Medical): No    Lack of Transportation (Non-Medical): No  Physical Activity: Insufficiently Active (07/20/2023)   Exercise Vital Sign    Days of Exercise per Week: 1 day    Minutes of Exercise per Session: 20 min  Stress: Stress Concern Present (07/20/2023)   [employer] of Occupational Health - Occupational Stress Questionnaire    Feeling of Stress : To  some extent  Social Connections: Socially Integrated (07/20/2023)   Social Connection and Isolation Panel [NHANES]    Frequency of Communication with Friends and Family: Once a week    Frequency of Social Gatherings with Friends and Family: Twice a week    Attends Religious Services: 1 to 4 times per year    Active Member of [employer] or Organizations: No    Attends Engineer, structural: 1 to 4 times per year    Marital Status: Married    Tobacco Counseling Counseling given: Not Answered Tobacco comments: smoking cessation materials not required   Clinical Intake:  Pre-visit preparation completed: Yes  Pain : 0-10 Pain Score: 6  Pain Type: Chronic pain Pain Location: Back (pain in neck) Pain Orientation: Lower Pain Descriptors / Indicators: Aching Pain Onset: More than a month ago Pain Frequency: Intermittent Pain Relieving Factors: medications, tiger balm, salon spas Effect of Pain on Daily Activities: slows activities  Pain Relieving Factors: medications, tiger balm, salon spas  BMI - recorded: 32.58 Nutritional Status: BMI > 30  Obese Nutritional Risks: None Diabetes: Yes CBG done?: Yes (BS 123 this am at home) CBG resulted in Enter/ Edit results?: No Did pt. bring in CBG monitor from home?: No  How often do you need to have someone help you when you read instructions, pamphlets, or other written materials from your doctor or pharmacy?: 3 - Sometimes  Interpreter Needed?: No  Comments: lives  with son and wife Information entered by :: B.Beverley Allender,LPN   Activities of Daily Living    07/20/2023    2:59 PM 06/24/2023    2:44 PM  In your present state of health, do you have any difficulty performing the following activities:  Hearing? 1 0  Vision? 1 0  Difficulty concentrating or making decisions? 0 0  Walking or climbing stairs? 1 0  Dressing or bathing? 0 0  Doing errands, shopping? 1 0  Preparing Food and eating ? N   Using the Toilet? N   In the  past six months, have you accidently leaked urine? N   Do you have problems with loss of bowel control? N   Managing your Medications? N   Managing your Finances? N   Housekeeping or managing your Housekeeping? Y     Patient Care Team: [name], MD as PCP - General (Family Medicine) Iran Ouch, MD as PCP - Cardiology (Cardiology) Mertie Moores, MD as Consulting Physician (Pulmonary Disease) Sherlon Handing, MD as Consulting Physician (Endocrinology) Midge Minium, MD as Consulting Physician (Gastroenterology) Gaspar Cola, [facility] (Inactive) (Pharmacist) Yevette Edwards, MD as Consulting Physician (Pain Medicine) Wenda Overland, LCSW as Social Worker Iran Ouch, MD as Consulting Physician (Cardiology)  Indicate any recent Medical Services you may have received from other than [facility] providers in the past year (date may be approximate).     Assessment:   This is a routine wellness examination for [place].  Hearing/Vision screen Hearing Screening - Comments:: Left ear hearing aid as partially deaf;hears well in rt ear Vision Screening - Comments:: Pt says he wears glasses;[facility]   Goals Addressed             This Visit's Progress    Begin and Stick with Counseling-Depression   On track    Timeframe:  Long-Range Goal Priority:  High Start Date:       10/31/21                      Expected End Date:       10/31/21                Follow Up Date 10/31/21    - check out counseling - keep 90 percent of counseling appointments - schedule counseling appointment    Why is this important?   Beating depression may take some time.  If you don't feel better right away, don't give up on your treatment plan.    Notes:      DIET - INCREASE WATER INTAKE   On track    Recommend to drink at least 6-8 8oz glasses of water per day.     Track and Manage Fluids and Swelling-Heart Failure   On track    Timeframe:  Long-Range Goal Priority:   High Start Date:   12/05/2020                          Expected End Date: 09/09/2021                      Follow Up Date 06/11/2021    - call office if I gain more than 2 pounds in one day or 5 pounds in one week - keep legs up while sitting - use salt in moderation    Why is this important?   It is important to check your weight  daily and watch how much salt and liquids you have.  It will help you to manage your heart failure.    Notes:        Depression Screen    07/23/2023    1:36 PM 07/21/2023    2:49 PM 06/24/2023    2:44 PM 03/24/2023    1:40 PM 03/22/2023    9:38 AM 02/16/2023   11:17 AM 12/16/2022   10:53 AM  PHQ 2/9 Scores  PHQ - 2 Score 0  0 0 1 3 0  PHQ- 9 Score     5 5 0     Information is confidential and restricted. Go to Review Flowsheets to unlock data.    Fall Risk    07/20/2023    2:59 PM 06/24/2023    2:44 PM 03/24/2023    1:40 PM 03/22/2023    9:37 AM 02/16/2023   11:17 AM  Fall Risk   Falls in the past year? 1 0 0 0 0  Number falls in past yr: 1 0   0  Injury with Fall? 0 0   0  Risk for fall due to : Impaired balance/gait;Impaired mobility;History of fall(s) No Fall Risks   Impaired balance/gait  Follow up Education provided;Falls prevention discussed   Falls prevention discussed;Education provided;Falls evaluation completed Falls prevention discussed    MEDICARE RISK AT HOME: Medicare Risk at Home Any stairs in or around the home?: Yes If so, are there any without handrails?: Yes Home free of loose throw rugs in walkways, pet beds, electrical cords, etc?: Yes Adequate lighting in your home to reduce risk of falls?: Yes Life alert?: No Use of a cane, walker or w/c?: Yes Grab bars in the bathroom?: Yes Shower chair or bench in shower?: Yes Elevated toilet seat or a handicapped toilet?: Yes  TIMED UP AND GO:  Was the test performed?  No    Cognitive Function:    03/22/2023    9:41 AM 01/12/2022    3:05 PM  MMSE - Mini Mental State Exam   Orientation to time 5 5  Orientation to Place 5 5  Registration 3 3  Attention/ Calculation 5 5  Recall 3 3  Language- name 2 objects 2 2  Language- repeat 1 1  Language- follow 3 step command 3 3  Language- read & follow direction 1 1  Write a sentence 1 1  Copy design 1 1  Total score 30 30        07/23/2023    1:53 PM 03/22/2023    9:46 AM 07/23/2020   11:32 AM 02/25/2018    3:22 PM  6CIT Screen  What Year? 0 points 0 points 0 points 0 points  What month? 0 points 0 points 0 points 0 points  What time? 0 points 0 points 0 points 0 points  Count back from 20 0 points 0 points 0 points 0 points  Months in reverse 0 points 0 points 0 points 0 points  Repeat phrase 0 points 0 points 0 points 0 points  Total Score 0 points 0 points 0 points 0 points    Immunizations Immunization History  Administered Date(s) Administered   Influenza Inj Mdck Quad Pf 06/14/2019   Influenza,inj,Quad PF,6+ Mos 06/16/2017, 06/10/2018, 07/23/2020, 06/04/2022   Influenza-Unspecified 07/18/2021   PFIZER(Purple Top)SARS-COV-2 Vaccination 12/22/2019, 01/16/2020, 08/05/2020   Pfizer Covid-19 Vaccine Bivalent Booster 65yrs & up 07/18/2021, 09/29/2022   Pneumococcal Conjugate-13 07/21/2019   Pneumococcal Polysaccharide-23 01/25/2018   RSV,unspecified 09/29/2022  Td 04/04/2013    TDAP status: Up to date  Flu Vaccine status: Due, Education has been provided regarding the importance of this vaccine. Advised may receive this vaccine at local pharmacy or Health Dept. Aware to provide a copy of the vaccination record if obtained from local pharmacy or Health Dept. Verbalized acceptance and understanding.  Pneumococcal vaccine status: Up to date  Covid-19 vaccine status: Completed vaccines  Qualifies for Shingles Vaccine? Yes   Zostavax completed No   Shingrix Completed?: No.    Education has been provided regarding the importance of this vaccine. Patient has been advised to call insurance company  to determine out of pocket expense if they have not yet received this vaccine. Advised may also receive vaccine at local pharmacy or Health Dept. Verbalized acceptance and understanding.  Screening Tests Health Maintenance  Topic Date Due   COVID-19 Vaccine (6 - 2023-24 season) 08/08/2023 (Originally 06/06/2023)   HEMOGLOBIN A1C  08/23/2023 (Originally 04/21/2023)   Zoster Vaccines- Shingrix (1 of 2) 10/23/2023 (Originally 06/28/2009)   INFLUENZA VACCINE  01/03/2024 (Originally 05/06/2023)   DTaP/Tdap/Td (2 - Tdap) 07/22/2024 (Originally 04/05/2023)   OPHTHALMOLOGY EXAM  10/02/2023   Diabetic kidney evaluation - eGFR measurement  11/26/2023   Diabetic kidney evaluation - Urine ACR  02/16/2024   FOOT EXAM  02/25/2024   Medicare Annual Wellness (AWV)  07/22/2024   Colonoscopy  04/14/2026   Hepatitis C Screening  Completed   HIV Screening  Completed   HPV VACCINES  Aged Out    Health Maintenance  There are no preventive care reminders to display for this patient.   Colorectal cancer screening: Type of screening: Colonoscopy. Completed 04/14/2021. Repeat every 5-10 years  Lung Cancer Screening: (Low Dose CT Chest recommended if Age 85-80 years, 20 pack-year currently smoking OR have quit w/in 15years.) does not qualify.   Lung Cancer Screening Referral: no  Additional Screening:  Hepatitis C Screening: does not qualify; Completed 01/26/2018  Vision Screening: Recommended annual ophthalmology exams for early detection of glaucoma and other disorders of the eye. Is the patient up to date with their annual eye exam?  Yes  Who is the provider or what is the name of the office in which the patient attends annual eye exams? [facility] If pt is not established with a provider, would they like to be referred to a provider to establish care? No .   Dental Screening: Recommended annual dental exams for proper oral hygiene  Diabetic Foot Exam: Diabetic Foot Exam: Completed 02/25/23  Community  Resource Referral / Chronic Care Management: CRR required this visit?  No   CCM required this visit?  No   Plan:     I have personally reviewed and noted the following in the patient's chart:   Medical and social history Use of alcohol, tobacco or illicit drugs  Current medications and supplements including opioid prescriptions. Patient is currently taking opioid prescriptions. Information provided to patient regarding non-opioid alternatives. Patient advised to discuss non-opioid treatment plan with their provider. Functional ability and status Nutritional status Physical activity Advanced directives List of other physicians Hospitalizations, surgeries, and ER visits in previous 12 months Vitals Screenings to include cognitive, depression, and falls Referrals and appointments  In addition, I have reviewed and discussed with patient certain preventive protocols, quality metrics, and best practice recommendations. A written personalized care plan for preventive services as well as general preventive health recommendations were provided to patient.    Sue Lush, LPN   09/81/1914  After Visit Summary: (MyChart) Due to this being a telephonic visit, the after visit summary with patients personalized plan was offered to patient via MyChart   Nurse Notes: Pt states he is doing better with some "issues" he had resolving. Pt does relay he is out of thyroid medication and skin lotion (both on profile and teed for PCP). He has no other concerns or questions. Pt indicates he and spouse have appt at pharmacy to received Flu and Covid vaccine but does not desire Shingles vaccine.

## 2023-07-25 ENCOUNTER — Other Ambulatory Visit: Payer: Self-pay | Admitting: Family Medicine

## 2023-07-25 DIAGNOSIS — E063 Autoimmune thyroiditis: Secondary | ICD-10-CM

## 2023-07-26 ENCOUNTER — Ambulatory Visit (INDEPENDENT_AMBULATORY_CARE_PROVIDER_SITE_OTHER): Payer: Medicare Other | Admitting: Licensed Clinical Social Worker

## 2023-07-26 DIAGNOSIS — F439 Reaction to severe stress, unspecified: Secondary | ICD-10-CM
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
## 2023-08-09 ENCOUNTER — Ambulatory Visit: Payer: Medicare Other | Admitting: Licensed Clinical Social Worker

## 2023-08-09 DIAGNOSIS — F331 Major depressive disorder, recurrent, moderate: Secondary | ICD-10-CM | POA: Diagnosis not present

## 2023-08-09 DIAGNOSIS — F439 Reaction to severe stress, unspecified: Secondary | ICD-10-CM | POA: Diagnosis not present

## 2023-08-09 NOTE — Progress Notes (Signed)
THERAPIST PROGRESS NOTE  Session Time: 2:54pm-3:58pm  Participation Level: Active  Behavioral Response: CasualAlertAnxious  Type of Therapy: Individual Therapy  Treatment Goals addressed:  Active     Anger Management     STG: [place] "[name]" will identify situations, thoughts, and feelings that trigger internal anger, and/or angry/aggressive actions as evidenced by self-report (Initial)     Start:  07/26/23    Expected End:  12/24/23         LTG: Pt reports he would like to "control anger issues"      Start:  07/26/23    Expected End:  12/24/23         Work with Marcial Pacas "[name]" to track symptoms, triggers, and/or skill use through a mood chart, diary card, or journal     Start:  07/26/23         Educate Muhamad "[name]" on relaxation techniques and the rationale for learning these techniques     Start:  07/26/23         Jorja Loa will learn 2 coping skills to practice daily for 3 weeks to decrease the frequency and severity of anger.      Start:  07/26/23           Anxiety     STG: Jorja Loa will learn 2 coping to practice daily to decrease the frequency and severity of anxious symptoms.  (Initial)     Start:  07/26/23    Expected End:  12/24/23         Level of anxiety will decrease     Start:  07/26/23    Expected End:  12/24/23            Work with patient individually to identify the major components of a recent episode of anxiety: physical symptoms, major thoughts and images, and major behaviors they experienced     Start:  07/26/23            ProgressTowards Goals: Progressing  Interventions: DBT, Solution Focused, and Supportive  Summary: REIN POPOV is a 62 y.o. male who presents with anxious and depressed symptoms.  Patient began session by stating, "I have been depressed."Patient identifies stressors to include his undiagnosed breathing issue citing frustration with medical professionals.  Patient provided updates about stress with legal encounter  regarding daughter-in-law violating the no contact order.  Patient reports their court date is November 22 and he is hoping to work towards mitigating the legal court case.  Identifies his wife to serve as a great support and processed feelings of relief and his ability to delegate the task of communicating expectations with their son.  Patient processed difficulties with recent loss of control regarding legal, family distress and health.  Patient was able to discuss skills he learned from his previous social work education and hypnosis background to Pensions consultant framework regarding his situation and physical health.  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  Patient reports he feels "empowered".  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 Patient expressed hope and reported he plans to utilize [facility] skills and future conversations with his son and Clinical research associate.   Suicidal/Homicidal: Nowithout intent/plan  Therapist Response: Patient presents for follow-up with clinician.  Patient arrives on time and maintains appropriate eye contact throughout the session.  Patient was oriented x 5. Clinician introduced the patient to dialectical behavioral  therapy and the importance of accepting change.  Clinician explored the clients understanding of radical acceptance and barriers he can set into place.  Provided patient psychoeducation [id number] medication skills.  Addressed patient's anxiety about coming across as "rude " when setting boundaries. Processed barriers to setting boundaries and the pt's learned behaviors feeding into people pleasing tendencies. Clinician and patient role-played utilizing these assertive communication tactics.  Clinician provided frequent reframing and helping the patient understand what he can and cannot control.  Plan: Return again in  1 week.  Diagnosis: MDD (major depressive disorder), recurrent episode, moderate (HCC)  Trauma and stressor-related disorder   Collaboration of Care: Check in with the patient and will see LCSW per availability. Patient agreed with treatment recommendations. Pt. is scheduled for a follow-up in one week.   Patient/Guardian was advised Release of Information must be obtained prior to any record release in order to collaborate their care with an outside provider. Patient/Guardian was advised if they have not already done so to contact the registration department to sign all necessary forms in order for [place] to release information regarding their care.   Consent: Patient/Guardian gives verbal consent for treatment and assignment of benefits for services provided during this visit. Patient/Guardian expressed understanding and agreed to proceed.   Dereck Leep, LCSW 08/09/2023

## 2023-08-11 ENCOUNTER — Encounter: Payer: Self-pay | Admitting: Nurse Practitioner

## 2023-08-11 ENCOUNTER — Ambulatory Visit: Payer: Medicare Other | Attending: Nurse Practitioner | Admitting: Nurse Practitioner

## 2023-08-11 VITALS — BP 105/71 | HR 93 | Ht 67.0 in | Wt 206.0 lb

## 2023-08-11 DIAGNOSIS — I951 Orthostatic hypotension: Secondary | ICD-10-CM | POA: Diagnosis not present

## 2023-08-11 DIAGNOSIS — E1142 Type 2 diabetes mellitus with diabetic polyneuropathy: Secondary | ICD-10-CM

## 2023-08-11 DIAGNOSIS — I10 Essential (primary) hypertension: Secondary | ICD-10-CM | POA: Diagnosis not present

## 2023-08-11 DIAGNOSIS — I255 Ischemic cardiomyopathy: Secondary | ICD-10-CM

## 2023-08-11 DIAGNOSIS — Z794 Long term (current) use of insulin: Secondary | ICD-10-CM

## 2023-08-11 DIAGNOSIS — N182 Chronic kidney disease, stage 2 (mild): Secondary | ICD-10-CM

## 2023-08-11 DIAGNOSIS — I5022 Chronic systolic (congestive) heart failure: Secondary | ICD-10-CM

## 2023-08-11 DIAGNOSIS — I2510 Atherosclerotic heart disease of native coronary artery without angina pectoris: Secondary | ICD-10-CM

## 2023-08-11 DIAGNOSIS — E785 Hyperlipidemia, unspecified: Secondary | ICD-10-CM

## 2023-08-11 MED ORDER — FUROSEMIDE 20 MG PO TABS: 20.0000 mg | ORAL_TABLET | Freq: Every day | ORAL | 0 refills | Status: DC

## 2023-08-11 MED ORDER — EZETIMIBE 10 MG PO TABS: ORAL_TABLET | ORAL | 0 refills | Status: DC

## 2023-08-11 NOTE — Progress Notes (Signed)
Office Visit    Patient Name: [name] Date of Encounter: 08/11/2023  Primary Care Provider:  [name], MD Primary Cardiologist:  Lorine Bears, MD  Chief Complaint    62 y.o. male w/ a h/o CAD s/p multiple PCI's, ischemic cardiomyopathy, HFrEF, hypertension, orthostatic hypotension, hyperlipidemia, type 2 diabetes mellitus, diabetic neuropathy, myasthenia gravis, sepsis, and obstructive sleep apnea, who presents for follow-up related to orthostasis and syncope.  Past Medical History  Subjective   Past Medical History:  Diagnosis Date   Allergy    dust, seasonal (worse in the fall).   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 Followed by Pain Specialist in [place], back and neck   Asthma    BRONCHITIS   Cataract    First Dx in 2012   Chronic combined systolic and diastolic congestive heart failure (HCC)    a. 03/2018 Echo: EF 30-35%; b. 07/2018 Echo: EF 35-40%; c. 09/2019 TEE: EF 40-45%; d. 12/2020 Echo: EF 35-40%; e.01/2022  Echo: EF 45-50%. sev apical HK; f. 01/2023 Echo: EF 40-45%, mod asymm basal-septal LVH, apical AK, nl RV fxn.   CKD (chronic kidney disease), stage II    Coronary artery disease    a. Prior Ant MI->s/p mult stents->LAD/RCA ([place]); b. 2016 Cath: nonobs dzs;  c. 04/2018 Cath/PCI: LCX 19m (3.25x15 Sierra DES); d. 02/2022 MV: high risk; e. 02/2022 Cath: LM nl, LAD 20p ISR, patent mid-stent, LCX patent stent, OM1 nl, OM2 50p, patent stent, OM3 40p, patent stent, RCA 40p ISR, 97m ISR, 9m/d, patent distal stent, RPDA patent stent, RPAV small, 80 (sl progression)-->Med Rx.   Deaf, left    Diabetes mellitus without complication (HCC)    TYPE 2   Diabetic peripheral neuropathy (HCC)    feet and hands   FUO (fever of unknown origin) 08/03/2018   GERD (gastroesophageal reflux disease)    Headache    muscle tension   Hyperlipidemia    Hypertension    Hyperthyroidism    Insomnia    Ischemic cardiomyopathy    a. 03/2018 Echo: EF 30-35%; b. 07/2018 Echo: EF  35-40%, Gr1 DD; c. 09/2019 TEE: EF 40-45%; d. 12/2020 Echo: EF 35-40%. GrI DD; e. 01/2022 Echo: EF 45-50%; f. 01/2023 Echo: EF 40-45%.   Knee pain, acute 05/06/2020   Left arm weakness 10/04/2019   Left leg weakness 12/01/2019   Lyme disease    Chronic   Myasthenia gravis (HCC)    (03/18/21 - no current treatment - "better than it has ever been" per pt)   Myocardial infarction (HCC) 2010   Palpitations    a. 10/2020 Zio: RSR, 88 avg. 3 brief SVT episodes (max 5 beats @ 128). Rare PACs/PVCs. Triggered events did not correlate w/ significant arrthymia - some w/ sinus tach.   Seasonal allergies    Sepsis (HCC)    a.07/2018 - unknown source. TEE neg for veg 09/2019.   Sleep apnea    CPAP   Wears hearing aid in both ears    Past Surgical History:  Procedure Laterality Date   BILATERAL CARPAL TUNNEL RELEASE Bilateral L in 2012 and R in 2013   CARDIAC CATHETERIZATION     Several Caths, most recent in  March 2016.   COLONOSCOPY WITH PROPOFOL N/A 01/10/2016   Procedure: COLONOSCOPY WITH PROPOFOL;  Surgeon: Midge Minium, MD;  Location: [facility];  Service: Endoscopy;  Laterality: N/A;   COLONOSCOPY WITH PROPOFOL N/A 04/14/2021   Procedure: COLONOSCOPY WITH PROPOFOL;  Surgeon:  Midge Minium, MD;  Location: [facility];  Service: Endoscopy;  Laterality: N/A;  Diabetic - insulin and oral meds   CORONARY ANGIOPLASTY     CORONARY STENT INTERVENTION N/A 04/25/2018   Procedure: CORONARY STENT INTERVENTION;  Surgeon: Iran Ouch, MD;  Location: [facility];  Service: Cardiovascular;  Laterality: N/A;   ESOPHAGOGASTRODUODENOSCOPY (EGD) WITH PROPOFOL N/A 01/10/2016   Procedure: ESOPHAGOGASTRODUODENOSCOPY (EGD) WITH PROPOFOL;  Surgeon: Midge Minium, MD;  Location: [facility];  Service: Endoscopy;  Laterality: N/A;   ESOPHAGOGASTRODUODENOSCOPY (EGD) WITH PROPOFOL N/A 04/14/2021   Procedure: ESOPHAGOGASTRODUODENOSCOPY (EGD) WITH BIOPSY;  Surgeon: Midge Minium, MD;  Location: [facility];  Service: Endoscopy;  Laterality: N/A;   EYE SURGERY Bilateral 2012   cataract/bilateral vitrectomies   GIVENS CAPSULE STUDY N/A 08/20/2021   Procedure: GIVENS CAPSULE STUDY;  Surgeon: Midge Minium, MD;  Location: [facility];  Service: Endoscopy;  Laterality: N/A;   LEFT HEART CATH AND CORONARY ANGIOGRAPHY Left 04/25/2018   Procedure: LEFT HEART CATH AND CORONARY ANGIOGRAPHY;  Surgeon: Iran Ouch, MD;  Location: [facility];  Service: Cardiovascular;  Laterality: Left;   LEFT HEART CATH AND CORONARY ANGIOGRAPHY Left 02/02/2022   Procedure: LEFT HEART CATH AND CORONARY ANGIOGRAPHY;  Surgeon: Iran Ouch, MD;  Location: [facility];  Service: Cardiovascular;  Laterality: Left;   TEE WITHOUT CARDIOVERSION N/A 09/05/2018   Procedure: TRANSESOPHAGEAL ECHOCARDIOGRAM (TEE);  Surgeon: Iran Ouch, MD;  Location: [facility];  Service: Cardiovascular;  Laterality: N/A;   TONSILLECTOMY AND ADENOIDECTOMY     As a child   TUNNELED VENOUS CATHETER PLACEMENT     removed    Allergies  Allergies  Allergen Reactions   Azathioprine Other (See Comments)    Azathioprine hypersensitivity reaction - Symptoms mimicking sepsis - was hospitalized   Novolog [Insulin Aspart] Hives      History of Present Illness      62 y.o. y/o male with above complex past medical history including CAD status post multiple PCI's, ischemic cardiomyopathy, HFrEF, hypertension, hyperlipidemia, type 2 diabetes mellitus, diabetic neuropathy, myasthenia gravis, sepsis, and obstructive sleep apnea.  He initially suffered a myocardial infarction in 2009 while living in [place].  He has had approximately 10 stents between the LAD artery over time.  In June 2019, he had worsening dyspnea on exertion and chest pain.  Echo showed an EF of 30 to 35%.  Cath showed a new 90% stenosis in the mid left circumflex with multiple patent LAD, RCA, and OM2 stents.  The circumflex was successfully treated with a  drug-eluting stent.  In the fall 2019, he was admitted with sepsis with subsequent TEE showing an EF of 40 to 45% without vegetation.  He had a syncopal episode in October 2021 in the setting of coughing with subsequent monitoring in January 2022 showing no significant arrhythmias.  In the setting of significant orthostasis, he has undergone significant scaling back of his medications.  He did not tolerate Entresto, and beta-blocker therapy ended up being discontinued due to ongoing severe orthostasis and presyncope despite downward titration.  In April 2023, he presented with chest pain and underwent stress testing which showed partially reversible defect in the apical to mid inferior, inferolateral, and apical to mid anterior locations.  Echo showed improved EF @ 45-50%.  He subsequently underwent diagnostic catheterization in May 2023 which showed predominantly moderate, nonobstructive disease with minimal in-stent restenosis.  He had some progression of the small vessel RPAV (80% stenosis), medical  therapy was recommended.   In early 2024, Mr. Alcoser c/o DOE and was noted to be mildly volume overloaded.  He responded well to titration of lasix therapy to 40 mg daily.  Metoprolol was added back to his therapy in 11/2022 due to ongoing sinus tachycardia.  Follow-up echo in April 2024 showed an EF of 40 to 45%.   Mr. Amendola was doing well at his last clinic visit in 04/2023, though he did notice an increased frequency of orthostasis which he felt he could manage by getting up more slowly.  Unfortunately, since then orthostatic symptoms have worsened.  He has frequent lightheadedness shortly after standing with several falls, and 2 syncopal episodes.  The first syncopal episode occurred in October, when he stood up and walked across his yard to speak to a neighbor, felt lightheaded, grabbed a fence, and then lost consciousness and fell to the ground.  Fortunately, he did not suffer any trauma.  More recently, he  got up out of bed to use the bathroom and by the time he made it around the edge of his bed, he felt lightheaded, sat on a chest, and then lost consciousness and fell to the floor.  He contacted our office in mid October and this appointment was scheduled for today.  He recently had lab work through his neurology office which showed elevated creatinine of 1.6.  He tries to push fluids but is not sure that he is getting an adequate amount.  He also remains on Lasix 40 mg daily.  Though chest pain symptoms increased in October, they have settled back down in the setting of improved stress and anxiety.  He currently experiences an anginal episode about twice a week, usually with anxiety or physical activity, lasting a few minutes, and resolving spontaneously.  He has not really had to take any sublingual nitroglycerin since October.  He denies palpitations, PND, orthopnea, edema, or early satiety.  He has been having chest congestion and dyspnea and has been followed by pulmonology.  He is currently on a steroid taper and antibiotics, in addition to inhalers for wheezing.  PFT showed moderate obstruction.  Objective  Home Medications    Current Outpatient Medications  Medication Sig Dispense Refill   albuterol (PROVENTIL) (2.5 MG/3ML) 0.083% nebulizer solution Take 3 mLs (2.5 mg total) by nebulization every 6 (six) hours as needed for wheezing or shortness of breath. 75 mL 2   albuterol (VENTOLIN HFA) 108 (90 Base) MCG/ACT inhaler Inhale 2 puffs into the lungs daily.     ammonium lactate (AMLACTIN) 12 % lotion Apply 1 Application topically as needed for dry skin. 400 g 3   aspirin EC 81 MG tablet Take 1 tablet (81 mg total) by mouth daily. 90 tablet 3   azithromycin (ZITHROMAX) 250 MG tablet Take 250 mg by mouth as directed.     clopidogrel (PLAVIX) 75 MG tablet TAKE 1 TABLET BY MOUTH DAILY WITH BREAKFAST 90 tablet 0   cyclobenzaprine (FLEXERIL) 10 MG tablet TAKE 1 TABLET(10 MG) BY MOUTH TWICE DAILY AS  NEEDED 30 tablet 3   dexlansoprazole (DEXILANT) 60 MG capsule Take 1 capsule (60 mg total) by mouth daily. 30 capsule 2   DULoxetine (CYMBALTA) 30 MG capsule TAKE 1 CAPSULE BY MOUTH EVERY DAY, WITH ONE 60 MG CAPSULE 90 capsule 0   DULoxetine (CYMBALTA) 60 MG capsule TAKE 1 CAPSULE(60 MG) BY MOUTH DAILY 90 capsule 1   empagliflozin (JARDIANCE) 10 MG TABS tablet TAKE 1 TABLET(10 MG) BY MOUTH DAILY  BEFORE BREAKFAST 90 tablet 3   fluticasone furoate-vilanterol (BREO ELLIPTA) 200-25 MCG/ACT AEPB Inhale 1 puff into the lungs daily.     gabapentin (NEURONTIN) 300 MG capsule Take 1 capsule (300 mg total) by mouth 3 (three) times daily. 90 capsule 1   gabapentin (NEURONTIN) 800 MG tablet Take 800 mg by mouth at bedtime.     insulin lispro (HUMALOG) 100 UNIT/ML KiwkPen Inject 6-10 Units into the skin 3 (three) times daily.  sliding scale 1 unit for every 10 units of carbs     ketoconazole (NIZORAL) 2 % cream Apply 1 Application topically daily. 60 g 0   LANTUS SOLOSTAR 100 UNIT/ML Solostar Pen Inject 20-23 Units into the skin daily at 10 pm. Sliding scale based on blood glucose  0   levocetirizine (XYZAL) 5 MG tablet TAKE 1 TABLET(5 MG) BY MOUTH EVERY EVENING 90 tablet 1   levothyroxine (SYNTHROID) 50 MCG tablet TAKE 1 TABLET BY MOUTH EVERY DAY ON MONDAY THROUGH SATURDAY, 2 TABLETS DAILY ON SUNDAY 102 tablet 0   loratadine (CLARITIN) 10 MG tablet Take 10 mg by mouth in the morning.     lubiprostone (AMITIZA) 8 MCG capsule TAKE 1 CAPSULE(8 MCG) BY MOUTH TWICE DAILY WITH A MEAL 180 capsule 0   metFORMIN (GLUCOPHAGE) 1000 MG tablet Take 1,000 mg by mouth 2 (two) times daily with a meal.     montelukast (SINGULAIR) 10 MG tablet Take 10 mg by mouth at bedtime.     Multiple Vitamins-Minerals (MENS MULTI VITAMIN & MINERAL PO) Take 1 tablet by mouth in the morning and at bedtime.     naloxone (NARCAN) nasal spray 4 mg/0.1 mL For excess sedation from opioids 1 kit 2   nitroGLYCERIN (NITROSTAT) 0.3 MG SL tablet  DISSOLVE 1 TABLET UNDER THE TONGUE EVERY 5 MINUTES AS NEEDED FOR CHEST PAIN, MAX 3 DOSES 25 tablet 0   Oxycodone HCl 10 MG TABS Take 1 tablet (10 mg total) by mouth in the morning, at noon, in the evening, and at bedtime. 120 tablet 0   [START ON 08/26/2023] Oxycodone HCl 10 MG TABS Take 1 tablet (10 mg total) by mouth in the morning, at noon, in the evening, and at bedtime. 120 tablet 0   predniSONE (DELTASONE) 10 MG tablet Take by mouth.     rosuvastatin (CRESTOR) 5 MG tablet Take 5 mg by mouth daily with supper.  0   Semaglutide, 1 MG/DOSE, 2 MG/1.5ML SOPN Inject 1 mg into the skin daily at 12 noon.     Tiotropium Bromide Monohydrate (SPIRIVA RESPIMAT) 1.25 MCG/ACT AERS INHALE 2 PUFFS INTO THE LUNGS DAILY 12 g 0   ezetimibe (ZETIA) 10 MG tablet TAKE 1 TABLET(10 MG) BY MOUTH DAILY 90 tablet 0   furosemide (LASIX) 20 MG tablet Take 1 tablet (20 mg total) by mouth daily. You may take an extra tablet as needed for increased swelling 90 tablet 0   Current Facility-Administered Medications  Medication Dose Route Frequency Provider Last Rate Last Admin   sodium chloride flush (NS) 0.9 % injection 3 mL  3 mL Intravenous Q12H Furth, Cadence H, PA-C         Physical Exam    VS:  BP 105/71 (BP Location: Left Arm, Patient Position: Sitting, Cuff Size: Normal)   Pulse 93   Ht 5\' 7"  (1.702 m)   Wt 206 lb (93.4 kg)   SpO2 94%   BMI 32.26 kg/m  , BMI Body mass index is 32.26 kg/m.    Orthostatic  VS for the past 24 hrs:  BP- Lying Pulse- Lying BP- Sitting Pulse- Sitting BP- Standing at 0 minutes Pulse- Standing at 0 minutes  08/11/23 1436 117/78 93 101/76 92 93/62 98      GEN: Well nourished, well developed, in no acute distress. HEENT: normal. Neck: Supple, no JVD, carotid bruits, or masses. Cardiac: RRR, no murmurs, rubs, or gallops. No clubbing, cyanosis, edema.  Radials 2+/PT 2+ and equal bilaterally.  Respiratory:  Respirations regular and unlabored, inspiratory wheezing with coarse breath  sounds throughout. GI: Soft, nontender, nondistended, BS + x 4. MS: no deformity or atrophy. Skin: warm and dry, no rash. Neuro:  Strength and sensation are intact. Psych: Normal affect.  Accessory Clinical Findings    ECG personally reviewed by me today - EKG Interpretation Date/Time:  Wednesday August 11 2023 14:34:02 EST Ventricular Rate:  93 PR Interval:  180 QRS Duration:  136 QT Interval:  382 QTC Calculation: 474 R Axis:   115  Text Interpretation: Normal sinus rhythm Right bundle branch block Inferior infarct (cited on or before 18-Jul-2020) Anterolateral infarct (cited on or before 18-Jul-2020) Confirmed by Nicolasa Ducking [phone]) on 08/11/2023 2:52:52 PM  - no acute changes.  Labs dated August 06, 2023 from [facility] Every[facility]:  Hemoglobin 14.2, hematocrit 42.7, WBC 6.3, platelets 229 Sodium 141, potassium 4.5, chloride 101, CO2 32.8, BUN 30, creatinine 1.6, glucose 72 Calcium 9.8, albumin 4.4, total protein 6.8 Total bilirubin 0.6, alkaline phosphatase 65, AST 23, ALT 24 TSH 2.004  Lab Results  Component Value Date   CHOL 106 06/24/2022   HDL 39 06/24/2022   LDLCALC 40 06/24/2022   LDLDIRECT 36 07/17/2021   TRIG 133 06/24/2022   CHOLHDL 3.3 07/17/2021    Lab Results  Component Value Date   HGBA1C 7.5 10/21/2022   Lab Results  Component Value Date   TSH 0.49 06/24/2022       Assessment & Plan    1.  Orthostatic hypotension/primary hypertension: Patient with a several year history of orthostasis which previous required cutting back in the discontinuation of multiple medications.  Following titration of Lasix and resumption of metoprolol earlier this year, he has noted progressively worsening orthostasis and has had 2 syncopal spells, both occurring shortly after standing.  He previously thought he could manage his orthostatic symptoms better however now recognizes that he cannot.  Recent lab work performed through his neurologist showed a creatinine of 1.6,  which is higher than he typically runs.  I am reducing his Lasix to 20 mg daily.  As he also thinks symptoms really changed once resuming metoprolol, I will discontinue.  Will reevaluate symptoms in 1 month.  We did discuss potential role of event monitoring for any recurrent syncope despite improvement in blood pressure.  2.  Acute kidney injury/stage II-III chronic kidney disease: Recent lab work performed through his neurologist showed an elevated creatinine of 1.6.  As outlined above, reducing Lasix dose.  3.  Coronary artery disease/stable angina:  Most recent diagnostic catheterization in May 2023 showed patent stents with minimal in-stent restenosis and some progression of small vessel disease involving the RPAV.  Currently symptoms are stable with episodic chest discomfort about twice a week, typically with anxiety or exertion.  Symptoms peaked in October but have since settled down and his anxiety levels have improved.  He remains on aspirin, Plavix, statin, and Zetia.  Discontinuing beta-blocker in the setting of above.  4.  Chronic heart failure with midrange ejection fraction/ischemic cardiomyopathy: EF 40-45% by  echo in April 2024.  Euvolemic on examination and based on recent labs and orthostasis, likely dry.  As above, discontinue metoprolol given its contribution to his orthostasis and also reducing Lasix back to 20 mg daily with additional 20 mg daily as needed.  He remains on Jardiance.  He previously did not tolerate  5.  Hyperlipidemia: LDL of 40 in September 2023.  Overdue for follow-up but not fasting today.  Complete repeat at follow-up in 1 month.  Continue rosuvastatin therapy.  6.  Type 2 diabetes mellitus: A1c 7.5 on Lantus, Humalog, metformin, semaglutide, and Jardiance.  7.  COPD/asthma: Being followed by pulmonology in the setting chronic cough and dyspnea with moderate obstruction on PFTs.  Recently placed on prednisone taper and azithromycin in the setting of recurrent  chest congestion and cough.  Wheezing on exam.  Will follow-up with pulmonology.  8.  Disposition: Follow-up in clinic in 1 month or sooner if necessary.  Nicolasa Ducking, NP 08/11/2023, 5:08 PM

## 2023-08-11 NOTE — Patient Instructions (Signed)
Medication Instructions:  STOP the Metoprolol  CHANGE: how you take the Furosemide to 20 mg once daily. You may take an extra 20 mg as needed for increased swelling.   *If you need a refill on your cardiac medications before your next appointment, please call your pharmacy*   Lab Work: None ordered If you have labs (blood work) drawn today and your tests are completely normal, you will receive your results only by: MyChart Message (if you have MyChart) OR A paper copy in the mail If you have any lab test that is abnormal or we need to change your treatment, we will call you to review the results.   Testing/Procedures: None ordered   Follow-Up: At [facility], you and your health needs are our priority.  As part of our continuing mission to provide you with exceptional heart care, we have created designated Provider Care Teams.  These Care Teams include your primary Cardiologist (physician) and Advanced Practice Providers (APPs -  Physician Assistants and Nurse Practitioners) who all work together to provide you with the care you need, when you need it.  We recommend signing up for the patient portal called "MyChart".  Sign up information is provided on this After Visit Summary.  MyChart is used to connect with patients for Virtual Visits (Telemedicine).  Patients are able to view lab/test results, encounter notes, upcoming appointments, etc.  Non-urgent messages can be sent to your provider as well.   To learn more about what you can do with MyChart, go to [email or web address].    Your next appointment:   1 month(s)  Provider:   You may see Lorine Bears, MD or one of the following Advanced Practice Providers on your designated Care Team:   Nicolasa Ducking, NP

## 2023-08-16 ENCOUNTER — Other Ambulatory Visit: Payer: Self-pay | Admitting: Family Medicine

## 2023-08-16 ENCOUNTER — Ambulatory Visit: Payer: Medicare Other | Admitting: Licensed Clinical Social Worker

## 2023-08-16 DIAGNOSIS — F439 Reaction to severe stress, unspecified: Secondary | ICD-10-CM | POA: Diagnosis not present

## 2023-08-16 DIAGNOSIS — F331 Major depressive disorder, recurrent, moderate: Secondary | ICD-10-CM

## 2023-08-16 DIAGNOSIS — J302 Other seasonal allergic rhinitis: Secondary | ICD-10-CM
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
## 2023-08-18 NOTE — Telephone Encounter (Signed)
Lvm to give [place] a call to schedule appt per sowles for followup

## 2023-08-24 ENCOUNTER — Ambulatory Visit: Payer: Medicare Other | Admitting: Licensed Clinical Social Worker

## 2023-08-24 DIAGNOSIS — F331 Major depressive disorder, recurrent, moderate: Secondary | ICD-10-CM

## 2023-08-24 DIAGNOSIS — F439 Reaction to severe stress, unspecified: Secondary | ICD-10-CM

## 2023-08-24 DIAGNOSIS — F419 Anxiety disorder, unspecified: Secondary | ICD-10-CM | POA: Diagnosis not present
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
## 2023-08-28 ENCOUNTER — Other Ambulatory Visit: Payer: Self-pay | Admitting: Family Medicine

## 2023-08-28 DIAGNOSIS — T402X5A Adverse effect of other opioids, initial encounter: Secondary | ICD-10-CM

## 2023-09-14 ENCOUNTER — Ambulatory Visit (INDEPENDENT_AMBULATORY_CARE_PROVIDER_SITE_OTHER): Payer: Medicare Other | Admitting: Physician Assistant

## 2023-09-14 VITALS — BP 128/70 | HR 92 | Temp 97.9°F | Resp 16 | Ht 67.0 in | Wt 213.0 lb

## 2023-09-14 DIAGNOSIS — J069 Acute upper respiratory infection, unspecified: Secondary | ICD-10-CM | POA: Diagnosis not present

## 2023-09-14 DIAGNOSIS — J4540 Moderate persistent asthma, uncomplicated: Secondary | ICD-10-CM

## 2023-09-14 MED ORDER — AZITHROMYCIN 250 MG PO TABS: ORAL_TABLET | ORAL | 0 refills | Status: DC

## 2023-09-14 MED ORDER — PREDNISONE 20 MG PO TABS: ORAL_TABLET | ORAL | 0 refills | Status: DC

## 2023-09-14 MED ORDER — AMOXICILLIN-POT CLAVULANATE 875-125 MG PO TABS: 1.0000 | ORAL_TABLET | Freq: Two times a day (BID) | ORAL | 0 refills | Status: DC

## 2023-09-14 NOTE — Progress Notes (Signed)
Acute Office Visit   Patient: [name]   DOB: [birth year]   62 y.o. Male  MRN: [id number] Visit Date: 09/14/2023  Today's healthcare provider: Oswaldo Conroy Katura Eatherly, PA-C  Introduced myself to the patient as a Secondary school teacher and provided education on APPs in clinical practice.    Chief Complaint  Patient presents with   Nasal Congestion    x1 week   Cough    Productive   Subjective    HPI HPI     Nasal Congestion    Additional comments: x1 week        Cough    Additional comments: Productive      Last edited by Dollene Primrose, CMA on 09/14/2023 11:06 AM.       URI -type symptoms  Onset: gradual  Duration: ongoing for about a week  Associated symptoms: productive coughing, myalgias, fatigue, nasal congestion,scratchy throat, ear fullness  Intervention:Alkaseltzer cold medicine - helps with nighttime coughing   Recent sick contacts: his wife was sick about 2 weeks ago but symptoms have resolved  Recent travel: none  COVID testing at home: he has tested at home  Result:Negative   He has had to use nebulizer while being ill- usually does not have use on regular basis He has been using albuterol inhaler 2-3 times per day rather than normal usage of 1-2 times per day   Most recent A1c was 7.9% per patient   Medications: Outpatient Medications Prior to Visit  Medication Sig   albuterol (PROVENTIL) (2.5 MG/3ML) 0.083% nebulizer solution Take 3 mLs (2.5 mg total) by nebulization every 6 (six) hours as needed for wheezing or shortness of breath.   albuterol (VENTOLIN HFA) 108 (90 Base) MCG/ACT inhaler Inhale 2 puffs into the lungs daily.   ammonium lactate (AMLACTIN) 12 % lotion Apply 1 Application topically as needed for dry skin.   aspirin EC 81 MG tablet Take 1 tablet (81 mg total) by mouth daily.   clopidogrel (PLAVIX) 75 MG tablet TAKE 1 TABLET BY MOUTH DAILY WITH BREAKFAST   cyclobenzaprine (FLEXERIL) 10 MG tablet TAKE 1 TABLET(10 MG) BY MOUTH TWICE DAILY AS  NEEDED   dexlansoprazole (DEXILANT) 60 MG capsule Take 1 capsule (60 mg total) by mouth daily.   DULoxetine (CYMBALTA) 30 MG capsule TAKE 1 CAPSULE BY MOUTH EVERY DAY, WITH ONE 60 MG CAPSULE   DULoxetine (CYMBALTA) 60 MG capsule TAKE 1 CAPSULE(60 MG) BY MOUTH DAILY   empagliflozin (JARDIANCE) 10 MG TABS tablet TAKE 1 TABLET(10 MG) BY MOUTH DAILY BEFORE BREAKFAST   ezetimibe (ZETIA) 10 MG tablet TAKE 1 TABLET(10 MG) BY MOUTH DAILY   fluticasone furoate-vilanterol (BREO ELLIPTA) 200-25 MCG/ACT AEPB Inhale 1 puff into the lungs daily.   furosemide (LASIX) 20 MG tablet Take 1 tablet (20 mg total) by mouth daily. You may take an extra tablet as needed for increased swelling   gabapentin (NEURONTIN) 300 MG capsule Take 1 capsule (300 mg total) by mouth 3 (three) times daily.   gabapentin (NEURONTIN) 800 MG tablet Take 800 mg by mouth at bedtime.   insulin lispro (HUMALOG) 100 UNIT/ML KiwkPen Inject 6-10 Units into the skin 3 (three) times daily.  sliding scale 1 unit for every 10 units of carbs   ketoconazole (NIZORAL) 2 % cream Apply 1 Application topically daily.   LANTUS SOLOSTAR 100 UNIT/ML Solostar Pen Inject 20-23 Units into the skin daily at 10 pm. Sliding scale based on blood glucose   levocetirizine (XYZAL)  5 MG tablet TAKE 1 TABLET(5 MG) BY MOUTH EVERY EVENING   levothyroxine (SYNTHROID) 50 MCG tablet TAKE 1 TABLET BY MOUTH EVERY DAY ON MONDAY THROUGH SATURDAY, 2 TABLETS DAILY ON SUNDAY   loratadine (CLARITIN) 10 MG tablet Take 10 mg by mouth in the morning.   lubiprostone (AMITIZA) 8 MCG capsule TAKE 1 CAPSULE(8 MCG) BY MOUTH TWICE DAILY WITH A MEAL   metFORMIN (GLUCOPHAGE) 1000 MG tablet Take 1,000 mg by mouth 2 (two) times daily with a meal.   montelukast (SINGULAIR) 10 MG tablet Take 10 mg by mouth at bedtime.   Multiple Vitamins-Minerals (MENS MULTI VITAMIN & MINERAL PO) Take 1 tablet by mouth in the morning and at bedtime.   naloxone (NARCAN) nasal spray 4 mg/0.1 mL For excess sedation  from opioids   nitroGLYCERIN (NITROSTAT) 0.3 MG SL tablet DISSOLVE 1 TABLET UNDER THE TONGUE EVERY 5 MINUTES AS NEEDED FOR CHEST PAIN, MAX 3 DOSES   Oxycodone HCl 10 MG TABS Take 1 tablet (10 mg total) by mouth in the morning, at noon, in the evening, and at bedtime.   rosuvastatin (CRESTOR) 5 MG tablet Take 5 mg by mouth daily with supper.   Semaglutide, 1 MG/DOSE, 2 MG/1.5ML SOPN Inject 1 mg into the skin daily at 12 noon.   Tiotropium Bromide Monohydrate (SPIRIVA RESPIMAT) 1.25 MCG/ACT AERS INHALE 2 PUFFS INTO THE LUNGS DAILY   [DISCONTINUED] azithromycin (ZITHROMAX) 250 MG tablet Take 250 mg by mouth as directed.   Facility-Administered Medications Prior to Visit  Medication Dose Route Frequency Provider   sodium chloride flush (NS) 0.9 % injection 3 mL  3 mL Intravenous Q12H Furth, Cadence H, PA-C    Review of Systems  Constitutional:  Positive for fatigue. Negative for chills and fever.  HENT:  Positive for congestion. Negative for ear pain, postnasal drip, sinus pressure, sinus pain and sore throat.   Respiratory:  Positive for cough, shortness of breath and wheezing.   Gastrointestinal:  Negative for diarrhea, nausea and vomiting.  Musculoskeletal:  Positive for myalgias.  Neurological:  Negative for headaches.        Objective    BP 128/70   Pulse 92   Temp 97.9 F (36.6 C) (Oral)   Resp 16   Ht 5\' 7"  (1.702 m)   Wt 213 lb (96.6 kg)   SpO2 97%   BMI 33.36 kg/m     Physical Exam Vitals reviewed.  Constitutional:      General: He is awake.     Appearance: Normal appearance. He is well-developed and well-groomed.  HENT:     Head: Normocephalic and atraumatic.     Right Ear: Hearing, tympanic membrane and ear canal normal.     Left Ear: Hearing, tympanic membrane and ear canal normal.     Mouth/Throat:     Lips: Pink.     Pharynx: Uvula midline. No pharyngeal swelling, oropharyngeal exudate, posterior oropharyngeal erythema or postnasal drip.  Eyes:      General: Lids are normal. Gaze aligned appropriately.  Cardiovascular:     Rate and Rhythm: Normal rate and regular rhythm.     Heart sounds: Normal heart sounds.  Pulmonary:     Effort: Pulmonary effort is normal.     Breath sounds: Decreased air movement present. Wheezing and rhonchi present.  Neurological:     Mental Status: He is alert.  Psychiatric:        Behavior: Behavior is cooperative.       No results found for any  visits on 09/14/23.  Assessment & Plan      No follow-ups on file.     Problem List Items Addressed This Visit       Respiratory   Asthma, moderate persistent, poorly-controlled   Relevant Medications   azithromycin (ZITHROMAX) 250 MG tablet   predniSONE (DELTASONE) 20 MG tablet   Other Visit Diagnoses     Upper respiratory tract infection, unspecified type    -  Primary   Relevant Medications   azithromycin (ZITHROMAX) 250 MG tablet   amoxicillin-clavulanate (AUGMENTIN) 875-125 MG tablet      Acute, new concern Reports upper respiratory symptoms for the past week that have led to wheezing and SOB He has previous hx of asthma- suspect exacerbation at this time due to physical exam findings but cannot rule out potential CAP today Will treat for asthma exacerbation and presumptively for CAP with Prednisone taper, zpack and augmentin  Reviewed that he should continue with inhalers and nebulizer treatments as directed.  Can continue with OTC medications for symptomatic management  Reviewed prednisone impacts on diabetes and glucose levels (A1c is above goal per patient)  Reviewed ED and return precautions Follow up as needed for persistent or progressing symptoms    No follow-ups on file.   I, Kenly Xiao E Heyden Jaber, PA-C, have reviewed all documentation for this visit. The documentation on 09/14/23 for the exam, diagnosis, procedures, and orders are all accurate and complete.   Jacquelin Hawking, MHS, PA-C [facility]

## 2023-09-15 ENCOUNTER — Ambulatory Visit: Payer: Medicare Other | Admitting: Nurse Practitioner

## 2023-09-15 ENCOUNTER — Ambulatory Visit (INDEPENDENT_AMBULATORY_CARE_PROVIDER_SITE_OTHER): Payer: Medicare Other | Admitting: Licensed Clinical Social Worker

## 2023-09-15 DIAGNOSIS — F439 Reaction to severe stress, unspecified: Secondary | ICD-10-CM

## 2023-09-15 DIAGNOSIS — F331 Major depressive disorder, recurrent, moderate: Secondary | ICD-10-CM
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
## 2023-09-16 ENCOUNTER — Ambulatory Visit: Payer: Medicare Other | Attending: Medical | Admitting: Medical

## 2023-09-16 ENCOUNTER — Encounter: Payer: Self-pay | Admitting: Medical

## 2023-09-16 VITALS — BP 127/79 | HR 97 | Ht 67.0 in | Wt 213.2 lb

## 2023-09-16 DIAGNOSIS — Z79899 Other long term (current) drug therapy: Secondary | ICD-10-CM

## 2023-09-16 DIAGNOSIS — E782 Mixed hyperlipidemia: Secondary | ICD-10-CM

## 2023-09-16 DIAGNOSIS — I10 Essential (primary) hypertension: Secondary | ICD-10-CM | POA: Diagnosis not present

## 2023-09-16 DIAGNOSIS — I2510 Atherosclerotic heart disease of native coronary artery without angina pectoris: Secondary | ICD-10-CM

## 2023-09-16 DIAGNOSIS — N179 Acute kidney failure, unspecified: Secondary | ICD-10-CM

## 2023-09-16 DIAGNOSIS — N182 Chronic kidney disease, stage 2 (mild): Secondary | ICD-10-CM

## 2023-09-16 DIAGNOSIS — I951 Orthostatic hypotension: Secondary | ICD-10-CM | POA: Diagnosis not present

## 2023-09-16 NOTE — Patient Instructions (Signed)
Medication Instructions:  Your Physician recommend you continue on your current medication as directed.    *If you need a refill on your cardiac medications before your next appointment, please call your pharmacy*   Lab Work: Your provider would like for you to have following labs drawn today BMET, Lipid Panel and Direst LDL.   If you have labs (blood work) drawn today and your tests are completely normal, you will receive your results only by: MyChart Message (if you have MyChart) OR A paper copy in the mail If you have any lab test that is abnormal or we need to change your treatment, we will call you to review the results.   Testing/Procedures: None ordered today.    Follow-Up: At [facility], you and your health needs are our priority.  As part of our continuing mission to provide you with exceptional heart care, we have created designated Provider Care Teams.  These Care Teams include your primary Cardiologist (physician) and Advanced Practice Providers (APPs -  Physician Assistants and Nurse Practitioners) who all work together to provide you with the care you need, when you need it.  We recommend signing up for the patient portal called "MyChart".  Sign up information is provided on this After Visit Summary.  MyChart is used to connect with patients for Virtual Visits (Telemedicine).  Patients are able to view lab/test results, encounter notes, upcoming appointments, etc.  Non-urgent messages can be sent to your provider as well.   To learn more about what you can do with MyChart, go to [email or web address].    Your next appointment:   3 month(s)  Provider:   You may see Lorine Bears, MD or one of the following Advanced Practice Providers on your designated Care Team:   Nicolasa Ducking, NP Eula Listen, PA-C Cadence Fransico Michael, PA-C Charlsie Quest, NP Carlos Levering, NP

## 2023-09-16 NOTE — Progress Notes (Signed)
Cardiology Office Note:    Date:  09/16/2023   ID:  [name], DOB 12-19-1960, MRN [id number]  PCP:  [name], MD  [facility] Cardiologist:  Lorine Bears, MD  [facility] Electrophysiologist:  None   Referring MD: [name], MD   Chief Complaint: 1 month follow-up  History of Present Illness:    [name] is a 62 y.o. male with a hx of CAD status post multiple PCI's, ischemic cardiomyopathy, HFrEF, hypertension, orthostatic hypotension, hyperlipidemia, diabetes type 2, diabetic neuropathy, myasthenia gravis, sepsis, COPD, asthma, and OSA who presents for follow-up related to orthostasis and syncope.  Patient has extensive cardiac history.  He had a total of 10 stents placed in LAD and RCA starting in 2009 after presented with a myocardial infarction. Most recent heart cath in May 2023 showed patent stents with minimal in-stent restenosis and some progression of small vessel disease involving the RPA V.  Patient had worsening heart failure in June 2019.  Echo showed worsening LV systolic function with an EF of 30% with akinesis of the anterior, anterolateral and apical myocardium.  There was mild mitral regurgitation.  Cardiac cath showed patent stents in the LAD, OM's and RCA.  There was a new mid left circumflex stenosis which was stented.  EF was 25 to 30%.  Patient was started on GDT M with improvement of EF to 40 to 45%.  Patient was last seen in November 2024 for orthostatic hypotension.  He has a history of chronic orthostasis requiring cutting back on multiple medications.  Lasix was reduced to 20 mg daily.  Metoprolol was discontinued.  Was noted creatinine was up to 1.6.  Today, the patient repots he is overall doing much better. He was found to have PNA and started treatment. He still has occasional dizziness, but it's overall much better. He waits before standing up. He is taking lasix 20mg  daily.    Past Medical History:  Diagnosis Date    Allergy    dust, seasonal (worse in the fall).   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 Followed by Pain Specialist in [place], back and neck   Asthma    BRONCHITIS   Cataract    First Dx in 2012   Chronic combined systolic and diastolic congestive heart failure (HCC)    a. 03/2018 Echo: EF 30-35%; b. 07/2018 Echo: EF 35-40%; c. 09/2019 TEE: EF 40-45%; d. 12/2020 Echo: EF 35-40%; e.01/2022  Echo: EF 45-50%. sev apical HK; f. 01/2023 Echo: EF 40-45%, mod asymm basal-septal LVH, apical AK, nl RV fxn.   CKD (chronic kidney disease), stage II    Coronary artery disease    a. Prior Ant MI->s/p mult stents->LAD/RCA ([place]); b. 2016 Cath: nonobs dzs;  c. 04/2018 Cath/PCI: LCX 37m (3.25x15 Sierra DES); d. 02/2022 MV: high risk; e. 02/2022 Cath: LM nl, LAD 20p ISR, patent mid-stent, LCX patent stent, OM1 nl, OM2 50p, patent stent, OM3 40p, patent stent, RCA 40p ISR, 69m ISR, 39m/d, patent distal stent, RPDA patent stent, RPAV small, 80 (sl progression)-->Med Rx.   Deaf, left    Diabetes mellitus without complication (HCC)    TYPE 2   Diabetic peripheral neuropathy (HCC)    feet and hands   FUO (fever of unknown origin) 08/03/2018   GERD (gastroesophageal reflux disease)    Headache    muscle tension   Hyperlipidemia    Hypertension    Hyperthyroidism    Insomnia    Ischemic cardiomyopathy  a. 03/2018 Echo: EF 30-35%; b. 07/2018 Echo: EF 35-40%, Gr1 DD; c. 09/2019 TEE: EF 40-45%; d. 12/2020 Echo: EF 35-40%. GrI DD; e. 01/2022 Echo: EF 45-50%; f. 01/2023 Echo: EF 40-45%.   Knee pain, acute 05/06/2020   Left arm weakness 10/04/2019   Left leg weakness 12/01/2019   Lyme disease    Chronic   Myasthenia gravis (HCC)    (03/18/21 - no current treatment - "better than it has ever been" per pt)   Myocardial infarction (HCC) 2010   Palpitations    a. 10/2020 Zio: RSR, 88 avg. 3 brief SVT episodes (max 5 beats @ 128). Rare PACs/PVCs. Triggered events did not correlate w/ significant arrthymia - some w/ sinus tach.    Seasonal allergies    Sepsis (HCC)    a.07/2018 - unknown source. TEE neg for veg 09/2019.   Sleep apnea    CPAP   Wears hearing aid in both ears     Past Surgical History:  Procedure Laterality Date   BILATERAL CARPAL TUNNEL RELEASE Bilateral L in 2012 and R in 2013   CARDIAC CATHETERIZATION     Several Caths, most recent in  March 2016.   COLONOSCOPY WITH PROPOFOL N/A 01/10/2016   Procedure: COLONOSCOPY WITH PROPOFOL;  Surgeon: Midge Minium, MD;  Location: [facility] END[facility];  Service: Endoscopy;  Laterality: N/A;   COLONOSCOPY WITH PROPOFOL N/A 04/14/2021   Procedure: COLONOSCOPY WITH PROPOFOL;  Surgeon: Midge Minium, MD;  Location: [facility];  Service: Endoscopy;  Laterality: N/A;  Diabetic - insulin and oral meds   CORONARY ANGIOPLASTY     CORONARY STENT INTERVENTION N/A 04/25/2018   Procedure: CORONARY STENT INTERVENTION;  Surgeon: Iran Ouch, MD;  Location: [facility];  Service: Cardiovascular;  Laterality: N/A;   ESOPHAGOGASTRODUODENOSCOPY (EGD) WITH PROPOFOL N/A 01/10/2016   Procedure: ESOPHAGOGASTRODUODENOSCOPY (EGD) WITH PROPOFOL;  Surgeon: Midge Minium, MD;  Location: [facility] END[facility];  Service: Endoscopy;  Laterality: N/A;   ESOPHAGOGASTRODUODENOSCOPY (EGD) WITH PROPOFOL N/A 04/14/2021   Procedure: ESOPHAGOGASTRODUODENOSCOPY (EGD) WITH BIOPSY;  Surgeon: Midge Minium, MD;  Location: [facility];  Service: Endoscopy;  Laterality: N/A;   EYE SURGERY Bilateral 2012   cataract/bilateral vitrectomies   GIVENS CAPSULE STUDY N/A 08/20/2021   Procedure: GIVENS CAPSULE STUDY;  Surgeon: Midge Minium, MD;  Location: [facility] ENDOSCOPY;  Service: Endoscopy;  Laterality: N/A;   LEFT HEART CATH AND CORONARY ANGIOGRAPHY Left 04/25/2018   Procedure: LEFT HEART CATH AND CORONARY ANGIOGRAPHY;  Surgeon: Iran Ouch, MD;  Location: [facility];  Service: Cardiovascular;  Laterality: Left;   LEFT HEART CATH AND CORONARY ANGIOGRAPHY Left 02/02/2022   Procedure:  LEFT HEART CATH AND CORONARY ANGIOGRAPHY;  Surgeon: Iran Ouch, MD;  Location: [facility];  Service: Cardiovascular;  Laterality: Left;   TEE WITHOUT CARDIOVERSION N/A 09/05/2018   Procedure: TRANSESOPHAGEAL ECHOCARDIOGRAM (TEE);  Surgeon: Iran Ouch, MD;  Location: [facility] ORS;  Service: Cardiovascular;  Laterality: N/A;   TONSILLECTOMY AND ADENOIDECTOMY     As a child   TUNNELED VENOUS CATHETER PLACEMENT     removed    Current Medications: Current Meds  Medication Sig   albuterol (PROVENTIL) (2.5 MG/3ML) 0.083% nebulizer solution Take 3 mLs (2.5 mg total) by nebulization every 6 (six) hours as needed for wheezing or shortness of breath.   albuterol (VENTOLIN HFA) 108 (90 Base) MCG/ACT inhaler Inhale 2 puffs into the lungs daily.   ammonium lactate (AMLACTIN) 12 % lotion Apply 1 Application topically as needed  for dry skin.   amoxicillin-clavulanate (AUGMENTIN) 875-125 MG tablet Take 1 tablet by mouth 2 (two) times daily.   aspirin EC 81 MG tablet Take 1 tablet (81 mg total) by mouth daily.   azithromycin (ZITHROMAX) 250 MG tablet Take 500mg  PO daily x1d and then 250mg  daily x4 days   clopidogrel (PLAVIX) 75 MG tablet TAKE 1 TABLET BY MOUTH DAILY WITH BREAKFAST   cyclobenzaprine (FLEXERIL) 10 MG tablet TAKE 1 TABLET(10 MG) BY MOUTH TWICE DAILY AS NEEDED   dexlansoprazole (DEXILANT) 60 MG capsule Take 1 capsule (60 mg total) by mouth daily.   DULoxetine (CYMBALTA) 30 MG capsule TAKE 1 CAPSULE BY MOUTH EVERY DAY, WITH ONE 60 MG CAPSULE   DULoxetine (CYMBALTA) 60 MG capsule TAKE 1 CAPSULE(60 MG) BY MOUTH DAILY   empagliflozin (JARDIANCE) 10 MG TABS tablet TAKE 1 TABLET(10 MG) BY MOUTH DAILY BEFORE BREAKFAST   ezetimibe (ZETIA) 10 MG tablet TAKE 1 TABLET(10 MG) BY MOUTH DAILY   fluticasone furoate-vilanterol (BREO ELLIPTA) 200-25 MCG/ACT AEPB Inhale 1 puff into the lungs daily.   furosemide (LASIX) 20 MG tablet Take 1 tablet (20 mg total) by mouth daily. You may take an  extra tablet as needed for increased swelling   gabapentin (NEURONTIN) 300 MG capsule Take 1 capsule (300 mg total) by mouth 3 (three) times daily.   gabapentin (NEURONTIN) 800 MG tablet Take 800 mg by mouth at bedtime.   insulin lispro (HUMALOG) 100 UNIT/ML KiwkPen Inject 6-10 Units into the skin 3 (three) times daily.  sliding scale 1 unit for every 10 units of carbs   ketoconazole (NIZORAL) 2 % cream Apply 1 Application topically daily.   LANTUS SOLOSTAR 100 UNIT/ML Solostar Pen Inject 20-23 Units into the skin daily at 10 pm. Sliding scale based on blood glucose   levocetirizine (XYZAL) 5 MG tablet TAKE 1 TABLET(5 MG) BY MOUTH EVERY EVENING   levothyroxine (SYNTHROID) 50 MCG tablet TAKE 1 TABLET BY MOUTH EVERY DAY ON MONDAY THROUGH SATURDAY, 2 TABLETS DAILY ON SUNDAY   loratadine (CLARITIN) 10 MG tablet Take 10 mg by mouth in the morning.   lubiprostone (AMITIZA) 8 MCG capsule TAKE 1 CAPSULE(8 MCG) BY MOUTH TWICE DAILY WITH A MEAL   metFORMIN (GLUCOPHAGE) 1000 MG tablet Take 1,000 mg by mouth 2 (two) times daily with a meal.   montelukast (SINGULAIR) 10 MG tablet Take 10 mg by mouth at bedtime.   Multiple Vitamins-Minerals (MENS MULTI VITAMIN & MINERAL PO) Take 1 tablet by mouth in the morning and at bedtime.   naloxone (NARCAN) nasal spray 4 mg/0.1 mL For excess sedation from opioids   nitroGLYCERIN (NITROSTAT) 0.3 MG SL tablet DISSOLVE 1 TABLET UNDER THE TONGUE EVERY 5 MINUTES AS NEEDED FOR CHEST PAIN, MAX 3 DOSES   Oxycodone HCl 10 MG TABS Take 1 tablet (10 mg total) by mouth in the morning, at noon, in the evening, and at bedtime.   predniSONE (DELTASONE) 20 MG tablet Take 60mg  PO daily x 2 days, then40mg  PO daily x 2 days, then 20mg  PO daily x 3 days   rosuvastatin (CRESTOR) 5 MG tablet Take 5 mg by mouth daily with supper.   Semaglutide, 1 MG/DOSE, 2 MG/1.5ML SOPN Inject 1 mg into the skin daily at 12 noon.   Tiotropium Bromide Monohydrate (SPIRIVA RESPIMAT) 1.25 MCG/ACT AERS INHALE 2  PUFFS INTO THE LUNGS DAILY   Current Facility-Administered Medications for the 09/16/23 encounter (Office Visit) with Fransico Michael, Brady Schiller H, PA-C  Medication   sodium chloride flush (NS) 0.9 %  injection 3 mL     Allergies:   Azathioprine and Novolog [insulin aspart (human analog)]   Social History   Socioeconomic History   Marital status: Married    Spouse name: Gavin Pound   Number of children: 0   Years of education: Not on file   Highest education level: Doctorate  Occupational History   Occupation: disabled    Comment: multiple medical problems - first approved for uncontrolled DM, but now has heart disease and Myasthenia Gravis   Tobacco Use   Smoking status: Never   Smokeless tobacco: Never   Tobacco comments:    smoking cessation materials not required  Vaping Use   Vaping status: Never Used  Substance and Sexual Activity   Alcohol use: Not Currently    Alcohol/week: 0.0 standard drinks of alcohol   Drug use: No   Sexual activity: Not Currently    Partners: Female  Other Topics Concern   Not on file  Social History Narrative   Not on file   Social Drivers of Health   Financial Resource Strain: Low Risk  (09/14/2023)   Overall Financial Resource Strain (CARDIA)    Difficulty of Paying Living Expenses: Not very hard  Recent Concern: Financial Resource Strain - Medium Risk (07/20/2023)   Overall Financial Resource Strain (CARDIA)    Difficulty of Paying Living Expenses: Somewhat hard  Food Insecurity: No Food Insecurity (09/14/2023)   Hunger Vital Sign    Worried About Running Out of Food in the Last Year: Never true    Ran Out of Food in the Last Year: Never true  Recent Concern: Food Insecurity - Food Insecurity Present (07/20/2023)   Hunger Vital Sign    Worried About Running Out of Food in the Last Year: Never true    Ran Out of Food in the Last Year: Sometimes true  Transportation Needs: Unmet Transportation Needs (09/14/2023)   PRAPARE - Transportation    Lack  of Transportation (Medical): Yes    Lack of Transportation (Non-Medical): Yes  Physical Activity: Insufficiently Active (09/14/2023)   Exercise Vital Sign    Days of Exercise per Week: 2 days    Minutes of Exercise per Session: 20 min  Stress: Stress Concern Present (09/14/2023)   [employer] of Occupational Health - Occupational Stress Questionnaire    Feeling of Stress : Very much  Social Connections: Socially Integrated (09/14/2023)   Social Connection and Isolation Panel [NHANES]    Frequency of Communication with Friends and Family: Three times a week    Frequency of Social Gatherings with Friends and Family: Once a week    Attends Religious Services: More than 4 times per year    Active Member of [employer] or Organizations: No    Attends Engineer, structural: 1 to 4 times per year    Marital Status: Married     Family History: The patient's family history includes Anxiety disorder in his mother; Cancer in his father; Dementia in his father; Depression in his mother; Diabetes in his brother and mother; Healthy in his brother and brother; Heart disease in his mother.  ROS:   Please see the history of present illness.     All other systems reviewed and are negative.  EKGs/Labs/Other Studies Reviewed:    The following studies were reviewed today:  Echo 01/2023   1. Left ventricular ejection fraction, by estimation, is 40 to 45%. The  left ventricle has mild to moderately decreased function. The left  ventricle demonstrates regional wall motion  abnormalities (see scoring  diagram/findings for description). There is   moderate asymmetric left ventricular hypertrophy of the basal-septal  segment. Left ventricular diastolic parameters are indeterminate. There is  akinesis of the left ventricular, apical segment.   2. Right ventricular systolic function is normal. The right ventricular  size is normal.   3. The mitral valve is normal in structure. No evidence of mitral  valve  regurgitation.   4. The aortic valve is tricuspid. Aortic valve regurgitation is not  visualized. Aortic valve sclerosis/calcification is present, without any  evidence of aortic stenosis.   5. There is mild dilatation of the aortic root, measuring 39 mm.   Comparison(s): 01/28/22 w/ Def. 45-50%, Asc ao 3.5.   LHC 02/2022     Prox LAD lesion is 20% stenosed.   Prox RCA lesion is 40% stenosed.   Mid RCA lesion is 20% stenosed.   Mid RCA to Dist RCA lesion is 40% stenosed.   Ost 2nd Mrg to 2nd Mrg lesion is 50% stenosed.   Ost 3rd Mrg lesion is 40% stenosed.   RPAV lesion is 80% stenosed.   Non-stenotic Mid LAD lesion was previously treated.   Non-stenotic Mid Cx lesion was previously treated.   Non-stenotic Dist RCA lesion was previously treated.   Non-stenotic 2nd Mrg lesion was previously treated.   Non-stenotic 3rd Mrg lesion was previously treated.   Non-stenotic Ost RPDA to RPDA lesion was previously treated.   There is mild to moderate left ventricular systolic dysfunction.   LV end diastolic pressure is mildly elevated.   The left ventricular ejection fraction is 35-45% by visual estimate.   1.  Widely patent LAD, left circumflex and RCA stents with no obstructive restenosis.  No significant change compared to most recent cardiac catheterization in 2019 with the exception of progression of stenosis in the right posterior AV groove supplying a small area. 2.  Mildly reduced LV systolic function and mildly elevated left ventricular end-diastolic pressure.   Recommendations: Continue aggressive medical therapy.  Echo 01/2022   1. Left ventricular ejection fraction, by estimation, is 45 to 50%. The  left ventricle has mildly decreased function. The left ventricle  demonstrates regional wall motion abnormalities (see scoring  diagram/findings for description). Left ventricular  diastolic parameters are indeterminate. There is severe hypokinesis of the  left ventricular,  entire apical segment.   2. Right ventricular systolic function is normal. The right ventricular  size is normal.   3. The mitral valve is degenerative. No evidence of mitral valve  regurgitation.   4. The aortic valve is tricuspid. Aortic valve regurgitation is not  visualized. Aortic valve sclerosis is present, with no evidence of aortic  valve stenosis.   5. Aortic dilatation noted. There is mild dilatation of the aortic root,  measuring 39 mm.    Myoview lexiscan 01/2022 Narrative & Impression      Findings are consistent with prior myocardial infarction with peri-infarct ischemia. The study is high risk.   No ST deviation was noted.   LV perfusion is abnormal. There is evidence of ischemia. There is evidence of infarction. Defect 1: There is a large defect with absent uptake present in the apical to mid inferior and inferolateral location(s) that is partially reversible. There is abnormal wall motion in the defect area. Consistent with peri-infarct ischemia. Defect 2: There is a medium defect with moderate reduction in uptake present in the apical to mid anterior location(s) that is partially reversible. There is abnormal wall motion in  the defect area. Consistent with peri-infarct ischemia.   Left ventricular function is abnormal. The left ventricular ejection fraction is moderately decreased (30-44%). End diastolic cavity size is normal. End systolic cavity size is normal.    EKG:  EKG is not ordered today.    Recent Labs: 11/25/2022: BUN 25; Creatinine, Ser 1.26; Potassium 3.8; Sodium 137  Recent Lipid Panel    Component Value Date/Time   CHOL 106 06/24/2022 0000   CHOL 92 (L) 07/17/2021 1117   TRIG 133 06/24/2022 0000   HDL 39 06/24/2022 0000   HDL 28 (L) 07/17/2021 1117   CHOLHDL 3.3 07/17/2021 1117   CHOLHDL 2.8 10/16/2019 1424   VLDL 26 11/29/2017 1154   LDLCALC 40 06/24/2022 0000   LDLCALC 39 07/17/2021 1117   LDLCALC 34 10/16/2019 1424   LDLDIRECT 36 07/17/2021 1117    Physical Exam:    VS:  BP 127/79   Pulse 97   Ht 5\' 7"  (1.702 m)   Wt 213 lb 3.2 oz (96.7 kg)   SpO2 93%   BMI 33.39 kg/m     Wt Readings from Last 3 Encounters:  09/16/23 213 lb 3.2 oz (96.7 kg)  09/14/23 213 lb (96.6 kg)  08/11/23 206 lb (93.4 kg)     GEN:  Well nourished, well developed in no acute distress HEENT: Normal NECK: No JVD; No carotid bruits LYMPHATICS: No lymphadenopathy CARDIAC: RRR, no murmurs, rubs, gallops RESPIRATORY: diffuse wheezing  ABDOMEN: Soft, non-tender, non-distended MUSCULOSKELETAL:  No edema; No deformity  SKIN: Warm and dry NEUROLOGIC:  Alert and oriented x 3 PSYCHIATRIC:  Normal affect   ASSESSMENT:    1. Medication management   2. Orthostatic hypotension   3. Essential hypertension   4. AKI (acute kidney injury) (HCC)   5. CKD (chronic kidney disease), stage II   6. Coronary artery disease involving native coronary artery of native heart without angina pectoris   7. Hyperlipidemia, mixed    PLAN:    In order of problems listed above:  Orthostatic hypotension/HTN Patient reports much improved symptoms off the beta-blocker and with reduced lasix dose.  He still has occasional dizziness when he stands up but takes this time.  Blood pressure today 127/79.  AKI on CKD stage 2-3 Scr 1.6, BUN 30. Lasix was decreased at the last appointment.  I will check a BMET today.  CAD  Cardiac cath in May 2023 showed patent stents with minimal in-stent restenosis and some progression of small vessel disease involving the RPA V.  Patient denies significant chest pain.  Continue aspirin, Plavix, statin, and Zetia.  Beta-blocker discontinued in the setting of orthostatic hypotension. No further ischemic work-up at this time.   Hyperlipidemia Will recheck a lipid panel with a direct LDL.  Continue Zetia 10 mg daily and Crestor 5 mg daily.  PNA/URA Reports he is on steroids and abx currently for PNA/URI.  Disposition: Follow up in 3 month(s) with  MD/APP   Signed, Demontrae Gilbert David Stall, PA-C  09/16/2023 11:25 AM    [facility]

## 2023-09-21 ENCOUNTER — Ambulatory Visit
Admission: RE | Admit: 2023-09-21 | Discharge: 2023-09-21 | Disposition: A | Discharge status: Home / Self Care | Payer: Medicare Other | Source: Ambulatory Visit | Attending: Anesthesiology | Admitting: Anesthesiology

## 2023-09-21 ENCOUNTER — Encounter: Payer: Self-pay | Admitting: Anesthesiology

## 2023-09-21 ENCOUNTER — Ambulatory Visit (HOSPITAL_BASED_OUTPATIENT_CLINIC_OR_DEPARTMENT_OTHER): Payer: Medicare Other | Admitting: Anesthesiology

## 2023-09-21 ENCOUNTER — Other Ambulatory Visit: Payer: Self-pay | Admitting: Anesthesiology

## 2023-09-21 VITALS — BP 109/91 | HR 93 | Temp 97.1°F | Resp 15 | Ht 67.0 in | Wt 205.0 lb

## 2023-09-21 DIAGNOSIS — M7918 Myalgia, other site: Secondary | ICD-10-CM | POA: Diagnosis not present

## 2023-09-21 DIAGNOSIS — R29898 Other symptoms and signs involving the musculoskeletal system: Secondary | ICD-10-CM | POA: Insufficient documentation

## 2023-09-21 DIAGNOSIS — G894 Chronic pain syndrome: Secondary | ICD-10-CM | POA: Diagnosis present

## 2023-09-21 DIAGNOSIS — M5432 Sciatica, left side: Secondary | ICD-10-CM | POA: Insufficient documentation

## 2023-09-21 DIAGNOSIS — M47817 Spondylosis without myelopathy or radiculopathy, lumbosacral region: Secondary | ICD-10-CM | POA: Insufficient documentation

## 2023-09-21 DIAGNOSIS — M542 Cervicalgia: Secondary | ICD-10-CM | POA: Diagnosis present

## 2023-09-21 DIAGNOSIS — A6923 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: Secondary | ICD-10-CM

## 2023-09-21 DIAGNOSIS — R52 Pain, unspecified: Secondary | ICD-10-CM

## 2023-09-21 DIAGNOSIS — M4716 Other spondylosis with myelopathy, lumbar region: Secondary | ICD-10-CM | POA: Insufficient documentation

## 2023-09-21 DIAGNOSIS — M545 Low back pain, unspecified: Secondary | ICD-10-CM | POA: Insufficient documentation

## 2023-09-21 DIAGNOSIS — F1190 Opioid use, unspecified, uncomplicated: Secondary | ICD-10-CM

## 2023-09-21 MED ORDER — OXYCODONE HCL 10 MG PO TABS: 10.0000 mg | ORAL_TABLET | Freq: Four times a day (QID) | ORAL | 0 refills | Status: DC

## 2023-09-21 MED ORDER — LIDOCAINE HCL (PF) 1 % IJ SOLN
INTRAMUSCULAR | Status: AC
  Filled 2023-09-21: qty 10

## 2023-09-21 MED ORDER — ROPIVACAINE HCL 2 MG/ML IJ SOLN
10.0000 mL | Freq: Once | INTRAMUSCULAR | Status: AC
Start: 2023-09-21 — End: 2023-09-21
  Administered 2023-09-21: 10 mL via EPIDURAL

## 2023-09-21 MED ORDER — OXYCODONE HCL 10 MG PO TABS: 10.0000 mg | ORAL_TABLET | Freq: Four times a day (QID) | ORAL | 0 refills | Status: AC

## 2023-09-21 MED ORDER — IOHEXOL 180 MG/ML  SOLN
10.0000 mL | Freq: Once | INTRAMUSCULAR | Status: AC | PRN
Start: 2023-09-21 — End: 2023-09-21
  Administered 2023-09-21: 10 mL via EPIDURAL
  Filled 2023-09-21: qty 20

## 2023-09-21 MED ORDER — ROPIVACAINE HCL 2 MG/ML IJ SOLN
INTRAMUSCULAR | Status: AC
  Filled 2023-09-21: qty 20

## 2023-09-21 MED ORDER — CYCLOBENZAPRINE HCL 10 MG PO TABS: 10.0000 mg | ORAL_TABLET | Freq: Two times a day (BID) | ORAL | 3 refills | Status: DC

## 2023-09-21 MED ORDER — TRIAMCINOLONE ACETONIDE 40 MG/ML IJ SUSP
40.0000 mg | Freq: Once | INTRAMUSCULAR | Status: AC
Start: 2023-09-21 — End: 2023-09-21
  Administered 2023-09-21: 40 mg

## 2023-09-21 MED ORDER — SODIUM CHLORIDE 0.9% FLUSH
10.0000 mL | Freq: Once | INTRAVENOUS | Status: AC
Start: 2023-09-21 — End: 2023-09-21
  Administered 2023-09-21: 10 mL

## 2023-09-21 MED ORDER — SODIUM CHLORIDE (PF) 0.9 % IJ SOLN
INTRAMUSCULAR | Status: AC
  Filled 2023-09-21: qty 10

## 2023-09-21 MED ORDER — LIDOCAINE HCL (PF) 1 % IJ SOLN
5.0000 mL | Freq: Once | INTRAMUSCULAR | Status: AC
Start: 2023-09-21 — End: 2023-09-21
  Administered 2023-09-21: 5 mL via SUBCUTANEOUS

## 2023-09-21 MED ORDER — TRIAMCINOLONE ACETONIDE 40 MG/ML IJ SUSP
INTRAMUSCULAR | Status: AC
  Filled 2023-09-21: qty 1

## 2023-09-21 NOTE — Progress Notes (Signed)
Nursing Pain Medication Assessment:  Safety precautions to be maintained throughout the outpatient stay will include: orient to surroundings, keep bed in low position, maintain call bell within reach at all times, provide assistance with transfer out of bed and ambulation.  Medication Inspection Compliance: Pill count conducted under aseptic conditions, in front of the patient. Neither the pills nor the bottle was removed from the patient's sight at any time. Once count was completed pills were immediately returned to the patient in their original bottle.  Medication: Oxycodone IR Pill/Patch Count:  25 of 120 pills remain Pill/Patch Appearance: Markings consistent with prescribed medication Bottle Appearance: Standard pharmacy container. Clearly labeled. Filled Date: 63 / 20 / 2024 Last Medication intake:  Today

## 2023-09-21 NOTE — Progress Notes (Deleted)
Safety precautions to be maintained throughout the outpatient stay will include: orient to surroundings, keep bed in low position, maintain call bell within reach at all times, provide assistance with transfer out of bed and ambulation.  

## 2023-09-21 NOTE — Progress Notes (Signed)
Subjective:  Patient ID: [name], male    DOB: 26-Mar-1961  Age: 62 y.o. MRN: [id number]  CC: Back Pain (low)   Procedure: L5-S1 lumbar epidural steroid under fluoroscopic guidance with no sedation  HPI [name] presents for reevaluation.  He has had some recurrence of his left lower leg sciatica symptoms and low back pain.  He receives periodic epidural steroid injections 2-3 times a year on average for intractable sciatica symptoms.  His low back pain responds favorably with about a 70% improvement lasting about 2 to 3 months.  For his baseline pain he maintains chronic opioid management to help with low back and neck pain.  The sciatica symptoms generally give about 80% relief with the epidural up to about 100% relief lasting 3 months before he gets recurrence of his same baseline pain.  He has chronic left lower extremity weakness.  Otherwise he is in his usual state of health with no new changes in lower extremity strength function bowel or bladder function.  Outpatient Medications Prior to Visit  Medication Sig Dispense Refill   albuterol (PROVENTIL) (2.5 MG/3ML) 0.083% nebulizer solution Take 3 mLs (2.5 mg total) by nebulization every 6 (six) hours as needed for wheezing or shortness of breath. 75 mL 2   albuterol (VENTOLIN HFA) 108 (90 Base) MCG/ACT inhaler Inhale 2 puffs into the lungs daily.     ammonium lactate (AMLACTIN) 12 % lotion Apply 1 Application topically as needed for dry skin. 400 g 3   aspirin EC 81 MG tablet Take 1 tablet (81 mg total) by mouth daily. 90 tablet 3   clopidogrel (PLAVIX) 75 MG tablet TAKE 1 TABLET BY MOUTH DAILY WITH BREAKFAST 90 tablet 0   cyclobenzaprine (FLEXERIL) 10 MG tablet TAKE 1 TABLET(10 MG) BY MOUTH TWICE DAILY AS NEEDED 30 tablet 3   dexlansoprazole (DEXILANT) 60 MG capsule Take 1 capsule (60 mg total) by mouth daily. 30 capsule 2   DULoxetine (CYMBALTA) 30 MG capsule TAKE 1 CAPSULE BY MOUTH EVERY DAY, WITH ONE 60 MG CAPSULE 90  capsule 0   DULoxetine (CYMBALTA) 60 MG capsule TAKE 1 CAPSULE(60 MG) BY MOUTH DAILY 90 capsule 1   empagliflozin (JARDIANCE) 10 MG TABS tablet TAKE 1 TABLET(10 MG) BY MOUTH DAILY BEFORE BREAKFAST 90 tablet 3   ezetimibe (ZETIA) 10 MG tablet TAKE 1 TABLET(10 MG) BY MOUTH DAILY 90 tablet 0   fluticasone furoate-vilanterol (BREO ELLIPTA) 200-25 MCG/ACT AEPB Inhale 1 puff into the lungs daily.     furosemide (LASIX) 20 MG tablet Take 1 tablet (20 mg total) by mouth daily. You may take an extra tablet as needed for increased swelling 90 tablet 0   gabapentin (NEURONTIN) 300 MG capsule Take 1 capsule (300 mg total) by mouth 3 (three) times daily. 90 capsule 1   gabapentin (NEURONTIN) 800 MG tablet Take 800 mg by mouth at bedtime.     insulin lispro (HUMALOG) 100 UNIT/ML KiwkPen Inject 6-10 Units into the skin 3 (three) times daily.  sliding scale 1 unit for every 10 units of carbs     ketoconazole (NIZORAL) 2 % cream Apply 1 Application topically daily. 60 g 0   LANTUS SOLOSTAR 100 UNIT/ML Solostar Pen Inject 20-23 Units into the skin daily at 10 pm. Sliding scale based on blood glucose  0   levocetirizine (XYZAL) 5 MG tablet TAKE 1 TABLET(5 MG) BY MOUTH EVERY EVENING 90 tablet 0   levothyroxine (SYNTHROID) 50 MCG tablet TAKE 1 TABLET BY MOUTH EVERY  DAY ON MONDAY THROUGH SATURDAY, 2 TABLETS DAILY ON SUNDAY 102 tablet 0   loratadine (CLARITIN) 10 MG tablet Take 10 mg by mouth in the morning.     lubiprostone (AMITIZA) 8 MCG capsule TAKE 1 CAPSULE(8 MCG) BY MOUTH TWICE DAILY WITH A MEAL 180 capsule 0   metFORMIN (GLUCOPHAGE) 1000 MG tablet Take 1,000 mg by mouth 2 (two) times daily with a meal.     montelukast (SINGULAIR) 10 MG tablet Take 10 mg by mouth at bedtime.     Multiple Vitamins-Minerals (MENS MULTI VITAMIN & MINERAL PO) Take 1 tablet by mouth in the morning and at bedtime.     naloxone (NARCAN) nasal spray 4 mg/0.1 mL For excess sedation from opioids 1 kit 2   nitroGLYCERIN (NITROSTAT) 0.3 MG  SL tablet DISSOLVE 1 TABLET UNDER THE TONGUE EVERY 5 MINUTES AS NEEDED FOR CHEST PAIN, MAX 3 DOSES 25 tablet 0   rosuvastatin (CRESTOR) 5 MG tablet Take 5 mg by mouth daily with supper.  0   Semaglutide, 1 MG/DOSE, 2 MG/1.5ML SOPN Inject 1 mg into the skin daily at 12 noon.     Tiotropium Bromide Monohydrate (SPIRIVA RESPIMAT) 1.25 MCG/ACT AERS INHALE 2 PUFFS INTO THE LUNGS DAILY 12 g 0   Oxycodone HCl 10 MG TABS Take 1 tablet (10 mg total) by mouth in the morning, at noon, in the evening, and at bedtime. 120 tablet 0   amoxicillin-clavulanate (AUGMENTIN) 875-125 MG tablet Take 1 tablet by mouth 2 (two) times daily. (Patient not taking: Reported on 09/21/2023) 20 tablet 0   azithromycin (ZITHROMAX) 250 MG tablet Take 500mg  PO daily x1d and then 250mg  daily x4 days (Patient not taking: Reported on 09/21/2023) 6 each 0   predniSONE (DELTASONE) 20 MG tablet Take 60mg  PO daily x 2 days, then40mg  PO daily x 2 days, then 20mg  PO daily x 3 days (Patient not taking: Reported on 09/21/2023) 13 tablet 0   Facility-Administered Medications Prior to Visit  Medication Dose Route Frequency Provider Last Rate Last Admin   sodium chloride flush (NS) 0.9 % injection 3 mL  3 mL Intravenous Q12H Furth, Cadence H, PA-C        Review of Systems CNS: No confusion or sedation Cardiac: No angina or palpitations GI: No abdominal pain or constipation Constitutional: No nausea vomiting fevers or chills  Objective:  BP 103/72   Pulse 95   Temp (!) 97.1 F (36.2 C)   Resp 16   Ht 5\' 7"  (1.702 m)   Wt 205 lb (93 kg)   SpO2 97%   BMI 32.11 kg/m    BP Readings from Last 3 Encounters:  09/21/23 103/72  09/16/23 127/79  09/14/23 128/70     Wt Readings from Last 3 Encounters:  09/21/23 205 lb (93 kg)  09/16/23 213 lb 3.2 oz (96.7 kg)  09/14/23 213 lb (96.6 kg)     Physical Exam Pt is alert and oriented PERRL EOMI HEART IS RRR no murmur or rub LCTA no wheezing or rales MUSCULOSKELETAL reveals some  paraspinous muscle tenderness but no overt trigger points.  He walks with antalgic gait comparable to his baseline.  Labs  Lab Results  Component Value Date   HGBA1C 7.5 10/21/2022   HGBA1C 7.7 06/24/2022   HGBA1C 7.8 03/24/2022   Lab Results  Component Value Date   MICROALBUR 0.2 02/16/2023   LDLCALC 40 06/24/2022   CREATININE 1.26 (H) 11/25/2022    -------------------------------------------------------------------------------------------------------------------- Lab Results  Component Value Date  WBC 5.3 06/05/2022   HGB 13.8 06/05/2022   HCT 42.5 06/05/2022   PLT 155 06/05/2022   GLUCOSE 61 (L) 11/25/2022   CHOL 106 06/24/2022   TRIG 133 06/24/2022   HDL 39 06/24/2022   LDLDIRECT 36 07/17/2021   LDLCALC 40 06/24/2022   ALT 14 06/24/2022   AST 16 06/24/2022   NA 137 11/25/2022   K 3.8 11/25/2022   CL 101 11/25/2022   CREATININE 1.26 (H) 11/25/2022   BUN 25 (H) 11/25/2022   CO2 29 11/25/2022   TSH 0.49 06/24/2022   PSA 0.56 09/23/2021   INR 1.26 07/07/2018   HGBA1C 7.5 10/21/2022   MICROALBUR 0.2 02/16/2023    --------------------------------------------------------------------------------------------------------------------- No results found.   Assessment & Plan:   Joyner "Jorja Loa" was seen today for back pain.  Diagnoses and all orders for this visit:  Chronic, continuous use of opioids  Sciatica, left side -     Lumbar Epidural Injection -     triamcinolone acetonide (KENALOG-40) injection 40 mg -     sodium chloride flush (NS) 0.9 % injection 10 mL -     ropivacaine (PF) 2 mg/mL (0.2%) (NAROPIN) injection 10 mL -     lidocaine (PF) (XYLOCAINE) 1 % injection 5 mL -     iohexol (OMNIPAQUE) 180 MG/ML injection 10 mL  Lumbar spondylosis with myelopathy -     Lumbar Epidural Injection -     triamcinolone acetonide (KENALOG-40) injection 40 mg -     sodium chloride flush (NS) 0.9 % injection 10 mL -     ropivacaine (PF) 2 mg/mL (0.2%) (NAROPIN)  injection 10 mL -     lidocaine (PF) (XYLOCAINE) 1 % injection 5 mL -     iohexol (OMNIPAQUE) 180 MG/ML injection 10 mL  Facet arthritis of lumbosacral region -     Lumbar Epidural Injection  Chronic pain syndrome  Left leg weakness  Cervicalgia  Lyme arthritis of multiple joints (HCC)  Low back pain at multiple sites  Other orders -     Oxycodone HCl 10 MG TABS; Take 1 tablet (10 mg total) by mouth in the morning, at noon, in the evening, and at bedtime. -     Oxycodone HCl 10 MG TABS; Take 1 tablet (10 mg total) by mouth in the morning, at noon, in the evening, and at bedtime.        ----------------------------------------------------------------------------------------------------------------------  Problem List Items Addressed This Visit   None Visit Diagnoses       Chronic, continuous use of opioids    -  Primary     Sciatica, left side       Relevant Medications   triamcinolone acetonide (KENALOG-40) injection 40 mg (Start on 09/21/2023  3:00 PM)   sodium chloride flush (NS) 0.9 % injection 10 mL (Start on 09/21/2023  3:00 PM)   ropivacaine (PF) 2 mg/mL (0.2%) (NAROPIN) injection 10 mL (Start on 09/21/2023  3:00 PM)   lidocaine (PF) (XYLOCAINE) 1 % injection 5 mL (Start on 09/21/2023  3:00 PM)   iohexol (OMNIPAQUE) 180 MG/ML injection 10 mL     Lumbar spondylosis with myelopathy       Relevant Medications   Oxycodone HCl 10 MG TABS (Start on 09/25/2023)   Oxycodone HCl 10 MG TABS (Start on 10/25/2023)   triamcinolone acetonide (KENALOG-40) injection 40 mg (Start on 09/21/2023  3:00 PM)   sodium chloride flush (NS) 0.9 % injection 10 mL (Start on 09/21/2023  3:00 PM)   ropivacaine (  PF) 2 mg/mL (0.2%) (NAROPIN) injection 10 mL (Start on 09/21/2023  3:00 PM)   lidocaine (PF) (XYLOCAINE) 1 % injection 5 mL (Start on 09/21/2023  3:00 PM)   iohexol (OMNIPAQUE) 180 MG/ML injection 10 mL     Facet arthritis of lumbosacral region       Relevant Medications   Oxycodone  HCl 10 MG TABS (Start on 09/25/2023)   Oxycodone HCl 10 MG TABS (Start on 10/25/2023)   triamcinolone acetonide (KENALOG-40) injection 40 mg (Start on 09/21/2023  3:00 PM)     Chronic pain syndrome       Relevant Medications   Oxycodone HCl 10 MG TABS (Start on 09/25/2023)   Oxycodone HCl 10 MG TABS (Start on 10/25/2023)   triamcinolone acetonide (KENALOG-40) injection 40 mg (Start on 09/21/2023  3:00 PM)   ropivacaine (PF) 2 mg/mL (0.2%) (NAROPIN) injection 10 mL (Start on 09/21/2023  3:00 PM)   lidocaine (PF) (XYLOCAINE) 1 % injection 5 mL (Start on 09/21/2023  3:00 PM)     Left leg weakness         Cervicalgia         Lyme arthritis of multiple joints (HCC)       Relevant Medications   Oxycodone HCl 10 MG TABS (Start on 09/25/2023)   Oxycodone HCl 10 MG TABS (Start on 10/25/2023)   triamcinolone acetonide (KENALOG-40) injection 40 mg (Start on 09/21/2023  3:00 PM)     Low back pain at multiple sites       Relevant Medications   Oxycodone HCl 10 MG TABS (Start on 09/25/2023)   Oxycodone HCl 10 MG TABS (Start on 10/25/2023)   triamcinolone acetonide (KENALOG-40) injection 40 mg (Start on 09/21/2023  3:00 PM)         ----------------------------------------------------------------------------------------------------------------------  1. Sciatica, left side Will proceed with a lumbar epidural steroid today.  The risks and benefits of been reviewed all questions answered.  Fortunately, he responds favorably to these as he has failed more conservative therapy with medication management alone and physical therapy generally aggravates the sciatica symptoms.  Continue with core stretching strengthening exercises as tolerated and increase ambulatory activity with core strengthening.  Continue current medication management. - Lumbar Epidural Injection - triamcinolone acetonide (KENALOG-40) injection 40 mg - sodium chloride flush (NS) 0.9 % injection 10 mL - ropivacaine (PF) 2 mg/mL (0.2%)  (NAROPIN) injection 10 mL - lidocaine (PF) (XYLOCAINE) 1 % injection 5 mL - iohexol (OMNIPAQUE) 180 MG/ML injection 10 mL  2. Lumbar spondylosis with myelopathy As above - Lumbar Epidural Injection - triamcinolone acetonide (KENALOG-40) injection 40 mg - sodium chloride flush (NS) 0.9 % injection 10 mL - ropivacaine (PF) 2 mg/mL (0.2%) (NAROPIN) injection 10 mL - lidocaine (PF) (XYLOCAINE) 1 % injection 5 mL - iohexol (OMNIPAQUE) 180 MG/ML injection 10 mL  3. Facet arthritis of lumbosacral region As above - Lumbar Epidural Injection  4. Chronic, continuous use of opioids (Primary) I have reviewed the [facility] practitioner database information is appropriate for refill today.  Refills will be generated for December 21 and January 20.  No other changes in his pharmacologic regimen are initiated.  5. Chronic pain syndrome As above  6. Left leg weakness   7. Cervicalgia   8. Lyme arthritis of multiple joints (HCC)   9. Low back pain at multiple sites Return to clinic in 1 month    ----------------------------------------------------------------------------------------------------------------------  I am having Kathi Simpers. N[place] "[name]" start on Oxycodone HCl. I am also having him maintain  his insulin lispro, loratadine, gabapentin, metFORMIN, Lantus SoloStar, naloxone, rosuvastatin, aspirin EC, gabapentin, Multiple Vitamins-Minerals (MENS MULTI VITAMIN & MINERAL PO), montelukast, albuterol, albuterol, fluticasone furoate-vilanterol, Semaglutide (1 MG/DOSE), empagliflozin, Spiriva Respimat, clopidogrel, nitroGLYCERIN, ketoconazole, DULoxetine, dexlansoprazole, lubiprostone, cyclobenzaprine, DULoxetine, ammonium lactate, levothyroxine, ezetimibe, furosemide, levocetirizine, azithromycin, amoxicillin-clavulanate, predniSONE, and Oxycodone HCl. We will continue to administer sodium chloride flush.   Meds ordered this encounter  Medications   Oxycodone HCl 10 MG TABS    Sig:  Take 1 tablet (10 mg total) by mouth in the morning, at noon, in the evening, and at bedtime.    Dispense:  120 tablet    Refill:  0   Oxycodone HCl 10 MG TABS    Sig: Take 1 tablet (10 mg total) by mouth in the morning, at noon, in the evening, and at bedtime.    Dispense:  120 tablet    Refill:  0   triamcinolone acetonide (KENALOG-40) injection 40 mg   sodium chloride flush (NS) 0.9 % injection 10 mL   ropivacaine (PF) 2 mg/mL (0.2%) (NAROPIN) injection 10 mL   lidocaine (PF) (XYLOCAINE) 1 % injection 5 mL   iohexol (OMNIPAQUE) 180 MG/ML injection 10 mL   Patient's Medications  New Prescriptions   OXYCODONE HCL 10 MG TABS    Take 1 tablet (10 mg total) by mouth in the morning, at noon, in the evening, and at bedtime.  Previous Medications   ALBUTEROL (PROVENTIL) (2.5 MG/3ML) 0.083% NEBULIZER SOLUTION    Take 3 mLs (2.5 mg total) by nebulization every 6 (six) hours as needed for wheezing or shortness of breath.   ALBUTEROL (VENTOLIN HFA) 108 (90 BASE) MCG/ACT INHALER    Inhale 2 puffs into the lungs daily.   AMMONIUM LACTATE (AMLACTIN) 12 % LOTION    Apply 1 Application topically as needed for dry skin.   AMOXICILLIN-CLAVULANATE (AUGMENTIN) 875-125 MG TABLET    Take 1 tablet by mouth 2 (two) times daily.   ASPIRIN EC 81 MG TABLET    Take 1 tablet (81 mg total) by mouth daily.   AZITHROMYCIN (ZITHROMAX) 250 MG TABLET    Take 500mg  PO daily x1d and then 250mg  daily x4 days   CLOPIDOGREL (PLAVIX) 75 MG TABLET    TAKE 1 TABLET BY MOUTH DAILY WITH BREAKFAST   CYCLOBENZAPRINE (FLEXERIL) 10 MG TABLET    TAKE 1 TABLET(10 MG) BY MOUTH TWICE DAILY AS NEEDED   DEXLANSOPRAZOLE (DEXILANT) 60 MG CAPSULE    Take 1 capsule (60 mg total) by mouth daily.   DULOXETINE (CYMBALTA) 30 MG CAPSULE    TAKE 1 CAPSULE BY MOUTH EVERY DAY, WITH ONE 60 MG CAPSULE   DULOXETINE (CYMBALTA) 60 MG CAPSULE    TAKE 1 CAPSULE(60 MG) BY MOUTH DAILY   EMPAGLIFLOZIN (JARDIANCE) 10 MG TABS TABLET    TAKE 1 TABLET(10 MG) BY  MOUTH DAILY BEFORE BREAKFAST   EZETIMIBE (ZETIA) 10 MG TABLET    TAKE 1 TABLET(10 MG) BY MOUTH DAILY   FLUTICASONE FUROATE-VILANTEROL (BREO ELLIPTA) 200-25 MCG/ACT AEPB    Inhale 1 puff into the lungs daily.   FUROSEMIDE (LASIX) 20 MG TABLET    Take 1 tablet (20 mg total) by mouth daily. You may take an extra tablet as needed for increased swelling   GABAPENTIN (NEURONTIN) 300 MG CAPSULE    Take 1 capsule (300 mg total) by mouth 3 (three) times daily.   GABAPENTIN (NEURONTIN) 800 MG TABLET    Take 800 mg by mouth at bedtime.   INSULIN LISPRO (  HUMALOG) 100 UNIT/ML KIWKPEN    Inject 6-10 Units into the skin 3 (three) times daily.  sliding scale 1 unit for every 10 units of carbs   KETOCONAZOLE (NIZORAL) 2 % CREAM    Apply 1 Application topically daily.   LANTUS SOLOSTAR 100 UNIT/ML SOLOSTAR PEN    Inject 20-23 Units into the skin daily at 10 pm. Sliding scale based on blood glucose   LEVOCETIRIZINE (XYZAL) 5 MG TABLET    TAKE 1 TABLET(5 MG) BY MOUTH EVERY EVENING   LEVOTHYROXINE (SYNTHROID) 50 MCG TABLET    TAKE 1 TABLET BY MOUTH EVERY DAY ON MONDAY THROUGH SATURDAY, 2 TABLETS DAILY ON SUNDAY   LORATADINE (CLARITIN) 10 MG TABLET    Take 10 mg by mouth in the morning.   LUBIPROSTONE (AMITIZA) 8 MCG CAPSULE    TAKE 1 CAPSULE(8 MCG) BY MOUTH TWICE DAILY WITH A MEAL   METFORMIN (GLUCOPHAGE) 1000 MG TABLET    Take 1,000 mg by mouth 2 (two) times daily with a meal.   MONTELUKAST (SINGULAIR) 10 MG TABLET    Take 10 mg by mouth at bedtime.   MULTIPLE VITAMINS-MINERALS (MENS MULTI VITAMIN & MINERAL PO)    Take 1 tablet by mouth in the morning and at bedtime.   NALOXONE (NARCAN) NASAL SPRAY 4 MG/0.1 ML    For excess sedation from opioids   NITROGLYCERIN (NITROSTAT) 0.3 MG SL TABLET    DISSOLVE 1 TABLET UNDER THE TONGUE EVERY 5 MINUTES AS NEEDED FOR CHEST PAIN, MAX 3 DOSES   PREDNISONE (DELTASONE) 20 MG TABLET    Take 60mg  PO daily x 2 days, then40mg  PO daily x 2 days, then 20mg  PO daily x 3 days    ROSUVASTATIN (CRESTOR) 5 MG TABLET    Take 5 mg by mouth daily with supper.   SEMAGLUTIDE, 1 MG/DOSE, 2 MG/1.5ML SOPN    Inject 1 mg into the skin daily at 12 noon.   TIOTROPIUM BROMIDE MONOHYDRATE (SPIRIVA RESPIMAT) 1.25 MCG/ACT AERS    INHALE 2 PUFFS INTO THE LUNGS DAILY  Modified Medications   Modified Medication Previous Medication   OXYCODONE HCL 10 MG TABS Oxycodone HCl 10 MG TABS      Take 1 tablet (10 mg total) by mouth in the morning, at noon, in the evening, and at bedtime.    Take 1 tablet (10 mg total) by mouth in the morning, at noon, in the evening, and at bedtime.  Discontinued Medications   No medications on file   ----------------------------------------------------------------------------------------------------------------------  Follow-up: Return in about 1 month (around 10/22/2023) for evaluation, med refill.   Procedure: L5-S1 LESI with fluoroscopic guidance and without moderate sedation  NOTE: The risks, benefits, and expectations of the procedure have been discussed and explained to the patient who was understanding and in agreement with suggested treatment plan. No guarantees were made.  DESCRIPTION OF PROCEDURE: Lumbar epidural steroid injection with no IV Versed, EKG, blood pressure, pulse, and pulse oximetry monitoring. The procedure was performed with the patient in the prone position under fluoroscopic guidance.  Sterile prep x3 was initiated and I then injected subcutaneous lidocaine to the overlying L5-S1 site after its fluoroscopic identifictation.  Using strict aseptic technique, I then advanced an 18-gauge Tuohy epidural needle in the midline using interlaminar approach via loss-of-resistance to saline technique. There was negative aspiration for heme or  CSF.  I then confirmed position with both AP and Lateral fluoroscan.  2 cc of contrast dye were injected and a  total of 5  mL of Preservative-Free normal saline mixed with 40 mg of Kenalog and 1cc Ropicaine 0.2  percent were injected incrementally via the  epidurally placed needle. The needle was removed. The patient tolerated the injection well and was convalesced and discharged to home in stable condition. Should the patient have any post procedure difficulty they have been instructed on how to contact us for assistance.   Yevette Edwards, MD

## 2023-09-21 NOTE — Patient Instructions (Signed)

## 2023-09-22 ENCOUNTER — Telehealth: Payer: Self-pay | Admitting: *Deleted

## 2023-09-22 NOTE — Telephone Encounter (Signed)
No problems post procedure. 

## 2023-09-24 LAB — TOXASSURE SELECT 13 (MW), URINE

## 2023-09-27 ENCOUNTER — Ambulatory Visit: Payer: Medicare Other | Admitting: Licensed Clinical Social Worker

## 2023-09-27 DIAGNOSIS — F439 Reaction to severe stress, unspecified: Secondary | ICD-10-CM

## 2023-09-27 DIAGNOSIS — F331 Major depressive disorder, recurrent, moderate: Secondary | ICD-10-CM
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
## 2023-10-04 ENCOUNTER — Ambulatory Visit: Payer: Medicare Other | Admitting: Licensed Clinical Social Worker

## 2023-10-04 DIAGNOSIS — F331 Major depressive disorder, recurrent, moderate: Secondary | ICD-10-CM

## 2023-10-04 DIAGNOSIS — F439 Reaction to severe stress, unspecified: Secondary | ICD-10-CM | POA: Diagnosis not present
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
## 2023-10-12 ENCOUNTER — Ambulatory Visit (INDEPENDENT_AMBULATORY_CARE_PROVIDER_SITE_OTHER): Payer: Medicare Other | Admitting: Licensed Clinical Social Worker

## 2023-10-12 DIAGNOSIS — Z91199 Patient's noncompliance with other medical treatment and regimen due to unspecified reason: Secondary | ICD-10-CM

## 2023-10-12 NOTE — Progress Notes (Signed)
 Clinician attempted session via face-to-face, but [name] did not appear for his session. Cln. called pt. He reports he called to cancel day of  due to migraine.

## 2023-10-16 ENCOUNTER — Encounter: Payer: Self-pay | Admitting: Oncology

## 2023-10-19 ENCOUNTER — Encounter: Payer: Self-pay | Admitting: Oncology

## 2023-10-19 ENCOUNTER — Encounter: Payer: Self-pay | Admitting: Anesthesiology

## 2023-10-19 ENCOUNTER — Ambulatory Visit: Payer: Medicare Other | Attending: Anesthesiology | Admitting: Anesthesiology

## 2023-10-19 DIAGNOSIS — M5432 Sciatica, left side: Secondary | ICD-10-CM

## 2023-10-19 DIAGNOSIS — M545 Low back pain, unspecified: Secondary | ICD-10-CM | POA: Diagnosis not present

## 2023-10-19 DIAGNOSIS — M4716 Other spondylosis with myelopathy, lumbar region: Secondary | ICD-10-CM

## 2023-10-19 DIAGNOSIS — M542 Cervicalgia: Secondary | ICD-10-CM

## 2023-10-19 DIAGNOSIS — G894 Chronic pain syndrome: Secondary | ICD-10-CM

## 2023-10-19 DIAGNOSIS — M51362 Other intervertebral disc degeneration, lumbar region with discogenic back pain and lower extremity pain: Secondary | ICD-10-CM

## 2023-10-19 DIAGNOSIS — M47817 Spondylosis without myelopathy or radiculopathy, lumbosacral region: Secondary | ICD-10-CM | POA: Diagnosis not present

## 2023-10-19 DIAGNOSIS — F1190 Opioid use, unspecified, uncomplicated: Secondary | ICD-10-CM

## 2023-10-19 DIAGNOSIS — R29898 Other symptoms and signs involving the musculoskeletal system: Secondary | ICD-10-CM | POA: Diagnosis not present

## 2023-10-19 DIAGNOSIS — A6923 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: Secondary | ICD-10-CM

## 2023-10-19 MED ORDER — OXYCODONE HCL 10 MG PO TABS: 10.0000 mg | ORAL_TABLET | Freq: Four times a day (QID) | ORAL | 0 refills | Status: DC

## 2023-10-19 NOTE — Progress Notes (Signed)
 Virtual Visit via Telephone Note  I connected with [name] on 10/19/23 at 11:20 AM EST by telephone and verified that I am speaking with the correct person using two identifiers.  Location: Patient: Home Provider: [facility] control center   I discussed the limitations, risks, security and privacy concerns of performing an evaluation and management service by telephone and the availability of in person appointments. I also discussed with the patient that there may be a patient responsible charge related to this service. The patient expressed understanding and agreed to proceed.   History of Present Illness: I spoke to [name] following his recent epidural from December.  We did this via telephone as he was able to do the video conference.  He reports that he is doing very well with his low back pain.  He has had about 80% improvement in his low back pain and resolution of his left lower extremity pain rated about 80 to 90%.  He does get some recurrence of the pain with prolonged standing or increased activity but for the most part he is doing much better.  He does notice that he is newly experiencing some right lower extremity sciatica pain but this is mild in nature.  He is trying to stay active but with the cold spell recently it has been more difficult.  This also prompts more spasming in his low back.  His left lower extremity weakness is still present but less profound than when he is having pain.  No change in bowel or bladder function is noted.  Otherwise he is in his usual state of health at this point.  Review of systems: General: No fevers or chills Pulmonary: No shortness of breath or dyspnea Cardiac: No angina or palpitations or lightheadedness GI: No abdominal pain or constipation Psych: No depression    Observations/Objective:  Current Outpatient Medications:    albuterol  (PROVENTIL ) (2.5 MG/3ML) 0.083% nebulizer solution, Take 3 mLs (2.5 mg total) by nebulization every 6  (six) hours as needed for wheezing or shortness of breath., Disp: 75 mL, Rfl: 2   albuterol  (VENTOLIN  HFA) 108 (90 Base) MCG/ACT inhaler, Inhale 2 puffs into the lungs daily., Disp: , Rfl:    ammonium lactate  (AMLACTIN) 12 % lotion, Apply 1 Application topically as needed for dry skin., Disp: 400 g, Rfl: 3   amoxicillin -clavulanate (AUGMENTIN ) 875-125 MG tablet, Take 1 tablet by mouth 2 (two) times daily. (Patient not taking: Reported on 09/21/2023), Disp: 20 tablet, Rfl: 0   aspirin  EC 81 MG tablet, Take 1 tablet (81 mg total) by mouth daily., Disp: 90 tablet, Rfl: 3   azithromycin  (ZITHROMAX ) 250 MG tablet, Take 500mg  PO daily x1d and then 250mg  daily x4 days (Patient not taking: Reported on 09/21/2023), Disp: 6 each, Rfl: 0   clopidogrel  (PLAVIX ) 75 MG tablet, TAKE 1 TABLET BY MOUTH DAILY WITH BREAKFAST, Disp: 90 tablet, Rfl: 0   cyclobenzaprine  (FLEXERIL ) 10 MG tablet, Take 1 tablet (10 mg total) by mouth 2 (two) times daily. TAKE 1 TABLET(10 MG) BY MOUTH TWICE DAILY AS NEEDED, Disp: 60 tablet, Rfl: 3   dexlansoprazole  (DEXILANT ) 60 MG capsule, Take 1 capsule (60 mg total) by mouth daily., Disp: 30 capsule, Rfl: 2   DULoxetine  (CYMBALTA ) 30 MG capsule, TAKE 1 CAPSULE BY MOUTH EVERY DAY, WITH ONE 60 MG CAPSULE, Disp: 90 capsule, Rfl: 0   DULoxetine  (CYMBALTA ) 60 MG capsule, TAKE 1 CAPSULE(60 MG) BY MOUTH DAILY, Disp: 90 capsule, Rfl: 1   empagliflozin  (JARDIANCE ) 10 MG  TABS tablet, TAKE 1 TABLET(10 MG) BY MOUTH DAILY BEFORE BREAKFAST, Disp: 90 tablet, Rfl: 3   ezetimibe  (ZETIA ) 10 MG tablet, TAKE 1 TABLET(10 MG) BY MOUTH DAILY, Disp: 90 tablet, Rfl: 0   fluticasone  furoate-vilanterol (BREO ELLIPTA ) 200-25 MCG/ACT AEPB, Inhale 1 puff into the lungs daily., Disp: , Rfl:    furosemide  (LASIX ) 20 MG tablet, Take 1 tablet (20 mg total) by mouth daily. You may take an extra tablet as needed for increased swelling, Disp: 90 tablet, Rfl: 0   gabapentin  (NEURONTIN ) 300 MG capsule, Take 1 capsule (300 mg  total) by mouth 3 (three) times daily., Disp: 90 capsule, Rfl: 1   gabapentin  (NEURONTIN ) 800 MG tablet, Take 800 mg by mouth at bedtime., Disp: , Rfl:    insulin  lispro (HUMALOG ) 100 UNIT/ML KiwkPen, Inject 6-10 Units into the skin 3 (three) times daily.  sliding scale 1 unit for every 10 units of carbs, Disp: , Rfl:    ketoconazole  (NIZORAL ) 2 % cream, Apply 1 Application topically daily., Disp: 60 g, Rfl: 0   LANTUS  SOLOSTAR 100 UNIT/ML Solostar Pen, Inject 20-23 Units into the skin daily at 10 pm. Sliding scale based on blood glucose, Disp: , Rfl: 0   levocetirizine (XYZAL ) 5 MG tablet, TAKE 1 TABLET(5 MG) BY MOUTH EVERY EVENING, Disp: 90 tablet, Rfl: 0   levothyroxine  (SYNTHROID ) 50 MCG tablet, TAKE 1 TABLET BY MOUTH EVERY DAY ON MONDAY THROUGH SATURDAY, 2 TABLETS DAILY ON SUNDAY, Disp: 102 tablet, Rfl: 0   loratadine  (CLARITIN ) 10 MG tablet, Take 10 mg by mouth in the morning., Disp: , Rfl:    lubiprostone  (AMITIZA ) 8 MCG capsule, TAKE 1 CAPSULE(8 MCG) BY MOUTH TWICE DAILY WITH A MEAL, Disp: 180 capsule, Rfl: 0   metFORMIN  (GLUCOPHAGE ) 1000 MG tablet, Take 1,000 mg by mouth 2 (two) times daily with a meal., Disp: , Rfl:    montelukast  (SINGULAIR ) 10 MG tablet, Take 10 mg by mouth at bedtime., Disp: , Rfl:    Multiple Vitamins-Minerals (MENS MULTI VITAMIN & MINERAL PO), Take 1 tablet by mouth in the morning and at bedtime., Disp: , Rfl:    naloxone  (NARCAN ) nasal spray 4 mg/0.1 mL, For excess sedation from opioids, Disp: 1 kit, Rfl: 2   nitroGLYCERIN  (NITROSTAT ) 0.3 MG SL tablet, DISSOLVE 1 TABLET UNDER THE TONGUE EVERY 5 MINUTES AS NEEDED FOR CHEST PAIN, MAX 3 DOSES, Disp: 25 tablet, Rfl: 0   [START ON 10/25/2023] Oxycodone  HCl 10 MG TABS, Take 1 tablet (10 mg total) by mouth in the morning, at noon, in the evening, and at bedtime., Disp: 120 tablet, Rfl: 0   [START ON 11/24/2023] Oxycodone  HCl 10 MG TABS, Take 1 tablet (10 mg total) by mouth in the morning, at noon, in the evening, and at  bedtime., Disp: 120 tablet, Rfl: 0   predniSONE  (DELTASONE ) 20 MG tablet, Take 60mg  PO daily x 2 days, then40mg  PO daily x 2 days, then 20mg  PO daily x 3 days (Patient not taking: Reported on 09/21/2023), Disp: 13 tablet, Rfl: 0   rosuvastatin  (CRESTOR ) 5 MG tablet, Take 5 mg by mouth daily with supper., Disp: , Rfl: 0   Semaglutide, 1 MG/DOSE, 2 MG/1.5ML SOPN, Inject 1 mg into the skin daily at 12 noon., Disp: , Rfl:    Tiotropium Bromide Monohydrate  (SPIRIVA  RESPIMAT) 1.25 MCG/ACT AERS, INHALE 2 PUFFS INTO THE LUNGS DAILY, Disp: 12 g, Rfl: 0  Current Facility-Administered Medications:    sodium chloride  flush (NS) 0.9 % injection 3 mL, 3  mL, Intravenous, Q12H, Furth, Cadence H, PA-C   Past Medical History:  Diagnosis Date   Allergy    dust, seasonal (worse in the fall).   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 Followed by Pain Specialist in [place], back and neck   Asthma    BRONCHITIS   Cataract    First Dx in 2012   Chronic combined systolic and diastolic congestive heart failure (HCC)    a. 03/2018 Echo: EF 30-35%; b. 07/2018 Echo: EF 35-40%; c. 09/2019 TEE: EF 40-45%; d. 12/2020 Echo: EF 35-40%; e.01/2022  Echo: EF 45-50%. sev apical HK; f. 01/2023 Echo: EF 40-45%, mod asymm basal-septal LVH, apical AK, nl RV fxn.   CKD (chronic kidney disease), stage II    Coronary artery disease    a. Prior Ant MI->s/p mult stents->LAD/RCA ([place]); b. 2016 Cath: nonobs dzs;  c. 04/2018 Cath/PCI: LCX 63m (3.25x15 Sierra DES); d. 02/2022 MV: high risk; e. 02/2022 Cath: LM nl, LAD 20p ISR, patent mid-stent, LCX patent stent, OM1 nl, OM2 50p, patent stent, OM3 40p, patent stent, RCA 40p ISR, 25m ISR, 23m/d, patent distal stent, RPDA patent stent, RPAV small, 80 (sl progression)-->Med Rx.   Deaf, left    Diabetes mellitus without complication (HCC)    TYPE 2   Diabetic peripheral neuropathy (HCC)    feet and hands   FUO (fever of unknown origin) 08/03/2018   GERD (gastroesophageal reflux disease)    Headache     muscle tension   Hyperlipidemia    Hypertension    Hyperthyroidism    Insomnia    Ischemic cardiomyopathy    a. 03/2018 Echo: EF 30-35%; b. 07/2018 Echo: EF 35-40%, Gr1 DD; c. 09/2019 TEE: EF 40-45%; d. 12/2020 Echo: EF 35-40%. GrI DD; e. 01/2022 Echo: EF 45-50%; f. 01/2023 Echo: EF 40-45%.   Knee pain, acute 05/06/2020   Left arm weakness 10/04/2019   Left leg weakness 12/01/2019   Lyme disease    Chronic   Myasthenia gravis (HCC)    (03/18/21 - no current treatment - better than it has ever been per pt)   Myocardial infarction (HCC) 2010   Palpitations    a. 10/2020 Zio: RSR, 88 avg. 3 brief SVT episodes (max 5 beats @ 128). Rare PACs/PVCs. Triggered events did not correlate w/ significant arrthymia - some w/ sinus tach.   Seasonal allergies    Sepsis (HCC)    a.07/2018 - unknown source. TEE neg for veg 09/2019.   Sleep apnea    CPAP   Wears hearing aid in both ears    Assessment and Plan:  1. Lumbar spondylosis with myelopathy   2. Facet arthritis of lumbosacral region   3. Chronic, continuous use of opioids   4. Chronic pain syndrome   5. Left leg weakness   6. Sciatica, left side   7. Cervicalgia   8. Lyme arthritis of multiple joints (HCC)   9. Low back pain at multiple sites    Based on our conversation it is appropriate to keep him on his current medication regimen.  He has an outstanding January 28 refill for his oxycodone .  We will refill for February 19 additionally.  I have reviewed the [place]  practitioner database information is appropriate for refill.  He continues to do well with this and drives good functional lifestyle improvement with the medications and better sleep overall.  I have encouraged him to continue with stretching strengthening exercises with no other change in his  regimen at this time.  Continue Flexeril  for muscle spasms.  Will schedule for 40-month return to clinic and possible repeat epidural in another 2 months for better right side  coverage if necessary.  Continue follow-up with his primary care physicians for baseline medical care. Follow Up Instructions:    I discussed the assessment and treatment plan with the patient. The patient was provided an opportunity to ask questions and all were answered. The patient agreed with the plan and demonstrated an understanding of the instructions.   The patient was advised to call back or seek an in-person evaluation if the symptoms worsen or if the condition fails to improve as anticipated.  I provided 30 minutes of non-face-to-face time during this encounter.   Lynwood KANDICE Clause, MD

## 2023-10-20 ENCOUNTER — Encounter: Payer: Self-pay | Admitting: Oncology

## 2023-10-20 ENCOUNTER — Ambulatory Visit: Payer: Medicare Other | Admitting: Licensed Clinical Social Worker

## 2023-10-20 DIAGNOSIS — F439 Reaction to severe stress, unspecified: Secondary | ICD-10-CM | POA: Diagnosis not present

## 2023-10-20 DIAGNOSIS — F331 Major depressive disorder, recurrent, moderate: Secondary | ICD-10-CM
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
## 2023-10-22 ENCOUNTER — Other Ambulatory Visit: Payer: Self-pay | Admitting: Family Medicine

## 2023-10-22 DIAGNOSIS — E063 Autoimmune thyroiditis: Secondary | ICD-10-CM

## 2023-10-22 NOTE — Telephone Encounter (Signed)
Requested medications are due for refill today.  yes  Requested medications are on the active medications list.  yes  Last refill. 07/23/2023 #102 0 rf  Future visit scheduled.   no  Notes to clinic.  Labs are expired.    Requested Prescriptions  Pending Prescriptions Disp Refills   levothyroxine (SYNTHROID) 50 MCG tablet [Pharmacy Med Name: LEVOTHYROXINE 0.05MG  ( ) TAB] 102 tablet 0    Sig: TAKE 1 TABLET BY MOUTH EVERY DAY ON MONDAY THROUGH SATURDAYS, THEN 2 TABLETS DAILY ON SUNDAYS.     Endocrinology:  Hypothyroid Agents Failed - 10/22/2023  5:51 PM      Failed - TSH in normal range and within 360 days    TSH  Date Value Ref Range Status  06/24/2022 0.49 0.41 - 5.90 Final  06/05/2022 1.015 0.350 - 4.500 uIU/mL Final    Comment:    Performed by a 3rd Generation assay with a functional sensitivity of <=0.01 uIU/mL. Performed at [facility], [address] [place], [place] [address]   09/23/2021 2.18 0.40 - 4.50 mIU/L Final         Passed - Valid encounter within last 12 months    Recent Outpatient Visits           1 month ago Upper respiratory tract infection, unspecified type   [facility] Mecum, Oswaldo Conroy, PA-C   4 months ago Viral upper respiratory tract infection   [facility] Berniece Salines, FNP   7 months ago Myasthenia gravis [facility])   No Name [facility] [name], MD   8 months ago Type 2 diabetes mellitus with diabetic polyneuropathy, with long-term current use of insulin [facility])   [facility] [name], MD   10 months ago Moderate asthma with acute exacerbation, unspecified whether persistent   [facility] Margarita Mail, DO       Future Appointments             In 1 month Furth, Cadence H, PA-C [facility] [place]

## 2023-10-27 ENCOUNTER — Ambulatory Visit: Payer: Medicare Other | Admitting: Physical Therapy

## 2023-11-01 DIAGNOSIS — Z794 Long term (current) use of insulin: Secondary | ICD-10-CM | POA: Diagnosis not present

## 2023-11-01 DIAGNOSIS — E063 Autoimmune thyroiditis: Secondary | ICD-10-CM | POA: Diagnosis not present

## 2023-11-01 DIAGNOSIS — I152 Hypertension secondary to endocrine disorders: Secondary | ICD-10-CM | POA: Diagnosis not present

## 2023-11-01 DIAGNOSIS — E1159 Type 2 diabetes mellitus with other circulatory complications: Secondary | ICD-10-CM | POA: Diagnosis not present

## 2023-11-01 DIAGNOSIS — E1169 Type 2 diabetes mellitus with other specified complication: Secondary | ICD-10-CM | POA: Diagnosis not present

## 2023-11-01 DIAGNOSIS — E1165 Type 2 diabetes mellitus with hyperglycemia: Secondary | ICD-10-CM | POA: Diagnosis not present

## 2023-11-01 DIAGNOSIS — E785 Hyperlipidemia, unspecified: Secondary | ICD-10-CM | POA: Diagnosis not present

## 2023-11-02 ENCOUNTER — Ambulatory Visit: Payer: Medicare Other | Admitting: Physical Therapy

## 2023-11-02 ENCOUNTER — Ambulatory Visit: Payer: Medicare Other | Admitting: Licensed Clinical Social Worker

## 2023-11-02 DIAGNOSIS — J441 Chronic obstructive pulmonary disease with (acute) exacerbation: Secondary | ICD-10-CM | POA: Diagnosis not present

## 2023-11-02 DIAGNOSIS — R0602 Shortness of breath: Secondary | ICD-10-CM | POA: Diagnosis not present

## 2023-11-02 DIAGNOSIS — G4733 Obstructive sleep apnea (adult) (pediatric): Secondary | ICD-10-CM | POA: Diagnosis not present

## 2023-11-02 DIAGNOSIS — F331 Major depressive disorder, recurrent, moderate: Secondary | ICD-10-CM | POA: Diagnosis not present

## 2023-11-02 DIAGNOSIS — F439 Reaction to severe stress, unspecified: Secondary | ICD-10-CM

## 2023-11-02 NOTE — Progress Notes (Signed)
   THERAPIST PROGRESS NOTE  Session Time: 2:03pm-2:45pm  Participation Level: Active  Behavioral Response: CasualAlertEuthymic  Type of Therapy: Individual Therapy  Treatment Goals addressed:        Goal: STG: Edgar "[name]" will identify situations, thoughts, and feelings that trigger internal anger, and/or angry/aggressive actions as evidenced by self-report                       Goal: LTG: Pt reports he would like to "control anger issues"                                                    Goal: STG: [name] will learn 2 coping to practice daily to decrease the frequency and severity of anxious symptoms.                        Goal: Level of anxiety will decrease                               ProgressTowards Goals:   Interventions: Supportive and Other: EMDR  Summary:[name] is a 63 y.o. male who presents with symptoms of anxiety. Patient reports symptoms to include uncontrollable worry, anxious feelings, avoidance, tension, restlessness. Pt was oriented times 5. Pt was cooperative and engaged. Pt denies SI/HI/AVH.   Patient utilized therapeutic space to process recent feelings of frustration following extension to court proceedings.  Continue to address controllable factors.  Patient consented to beginning EMDR treatment for today's session.  Clinician began resourcing with patient by educating patient on 7-11 breathing, belly breathing, acupressure breathing.  Patient was able to practice each coping skill and reinforced positive sensation/experiences.  Patient identified he will practice acupressure breathing every day for 3 rounds until his next session.  Suicidal/Homicidal: Nowithout intent/plan  Therapist Response: Cln utilized active and supportive reflection to create safe environment for patient to process recent life stressors. Clinician assessed for current symptoms, stressors, safety since last session.  Clinician utilized EMDR to  began resourcing with patient by educating on 7-11 breathing, belly breathing, acupressure breathing.  Plan: Return again in 2 weeks.  Diagnosis: MDD (major depressive disorder), recurrent episode, moderate (HCC)  Trauma and stressor-related disorder   Collaboration of Care: AEB psychiatrist can access notes and cln. Will review psychiatrists' notes. Check in with the patient and will see LCSW per availability. Patient agreed with treatment recommendations.  Patient/Guardian was advised Release of Information must be obtained prior to any record release in order to collaborate their care with an outside provider. Patient/Guardian was advised if they have not already done so to contact the registration department to sign all necessary forms in order for [place] to release information regarding their care.   Consent: Patient/Guardian gives verbal consent for treatment and assignment of benefits for services provided during this visit. Patient/Guardian expressed understanding and agreed to proceed.   Dereck Leep, LCSW 11/02/2023

## 2023-11-04 ENCOUNTER — Other Ambulatory Visit: Payer: Self-pay | Admitting: Nurse Practitioner

## 2023-11-05 ENCOUNTER — Other Ambulatory Visit: Payer: Self-pay | Admitting: Medical

## 2023-11-05 DIAGNOSIS — I5022 Chronic systolic (congestive) heart failure: Secondary | ICD-10-CM

## 2023-11-05 MED ORDER — FUROSEMIDE 20 MG PO TABS: 20.0000 mg | ORAL_TABLET | Freq: Every day | ORAL | 1 refills | Status: DC

## 2023-11-08 ENCOUNTER — Other Ambulatory Visit: Payer: Self-pay | Admitting: Nurse Practitioner

## 2023-11-09 ENCOUNTER — Other Ambulatory Visit: Payer: Self-pay | Admitting: Nurse Practitioner

## 2023-11-09 ENCOUNTER — Ambulatory Visit: Payer: Medicare Other

## 2023-11-12 ENCOUNTER — Ambulatory Visit: Payer: Medicare Other | Admitting: Physical Therapy

## 2023-11-15 ENCOUNTER — Ambulatory Visit (INDEPENDENT_AMBULATORY_CARE_PROVIDER_SITE_OTHER): Payer: Medicare Other | Admitting: Licensed Clinical Social Worker

## 2023-11-15 DIAGNOSIS — F439 Reaction to severe stress, unspecified: Secondary | ICD-10-CM | POA: Diagnosis not present

## 2023-11-15 DIAGNOSIS — F331 Major depressive disorder, recurrent, moderate: Secondary | ICD-10-CM | POA: Diagnosis not present

## 2023-11-15 NOTE — Progress Notes (Signed)
   THERAPIST PROGRESS NOTE  Session Time: 11-11:43am  Participation Level: Active  Behavioral Response: CasualAlertEuthymic  Type of Therapy: Individual Therapy  Treatment Goals addressed:  Goal: STG: Gianlucas "[name]" will identify situations, thoughts, and feelings that trigger internal anger, and/or angry/aggressive actions as evidenced by self-report                                Goal: LTG: Pt reports he would like to "control anger issues"                                                                                         Goal: STG: [name] will learn 2 coping to practice daily to decrease the frequency and severity of anxious symptoms.                                 Goal: Level of anxiety will decrease               ProgressTowards Goals: Progressing  Interventions: Supportive and Other: EMDR  Summary: SIMRANJIT RAMBERG is a 63 y.o. male who presents with symptoms of anxiety. Patient reports symptoms to include uncontrollable worry, anxious feelings, avoidance, tension, restlessness. Pt was oriented times 5. Pt was cooperative and engaged. Pt denies SI/HI/AVH.   Patient reports he was successful and routinely practicing belly and acupressure breathing.  Clinician completed resourcing with patient by constructing his inner peaceful place and container.  Patient process positively reinforcing these resources and practice utilizing his peaceful place and container in session.  For homework, patient will practice using his peaceful place and container routinely until his next appointment.  Suicidal/Homicidal: Nowithout intent/plan  Therapist Response: Cln utilized active and supportive reflection to create safe environment for patient to process recent life stressors. Clinician assessed for current symptoms, stressors, safety since last session.  Clinician utilized EMDR to engage in resourcing with patient by constructing his peaceful place and container.    Plan: Return again in 2 weeks.  Diagnosis: MDD (major depressive disorder), recurrent episode, moderate (HCC)  Trauma and stressor-related disorder   Collaboration of Care: AEB psychiatrist can access notes and cln. Will review psychiatrists' notes. Check in with the patient and will see LCSW per availability. Patient agreed with treatment recommendations.   Patient/Guardian was advised Release of Information must be obtained prior to any record release in order to collaborate their care with an outside provider. Patient/Guardian was advised if they have not already done so to contact the registration department to sign all necessary forms in order for us  to release information regarding their care.   Consent: Patient/Guardian gives verbal consent for treatment and assignment of benefits for services provided during this visit. Patient/Guardian expressed understanding and agreed to proceed.   Marvin Slot, LCSW 11/15/2023

## 2023-11-16 ENCOUNTER — Ambulatory Visit: Payer: Medicare Other

## 2023-11-19 ENCOUNTER — Ambulatory Visit: Payer: Medicare Other | Admitting: Physical Therapy

## 2023-11-20 ENCOUNTER — Other Ambulatory Visit: Payer: Self-pay | Admitting: Family Medicine

## 2023-11-20 DIAGNOSIS — J302 Other seasonal allergic rhinitis: Secondary | ICD-10-CM

## 2023-11-23 ENCOUNTER — Ambulatory Visit: Payer: Medicare Other

## 2023-11-23 ENCOUNTER — Telehealth: Payer: Self-pay | Admitting: Cardiovascular Disease

## 2023-11-23 DIAGNOSIS — Z79899 Other long term (current) drug therapy: Secondary | ICD-10-CM

## 2023-11-23 DIAGNOSIS — I5022 Chronic systolic (congestive) heart failure: Secondary | ICD-10-CM

## 2023-11-23 NOTE — Telephone Encounter (Signed)
Returned the call to the patient. He stated that his Furosemide was decreased to 20 mg once daily with an extra 20 mg as needed for weight gain back in December.   For the first two weeks, he was doing okay on the 20 mg but then he gained 10 pounds in two days. He took the 40 mg of the Furosemide and immediately lost 5 pounds. When he goes back to the 20 mg, he starts to gain the weight.  He is currently up 6 pounds and taking 40 mg daily. Both ankles are swollen. He has some wheezing but is not short of breath when ambulating. He stated that he does sleep on a wedge at night due to trouble breathing lying flat.  He has been advised of ED precautions but he does not feel like it is an emergency. He would like to have permission to take 60 mg of the furosemide and a new prescription written.

## 2023-11-23 NOTE — Telephone Encounter (Signed)
Pt c/o medication issue:  1. Name of Medication:   furosemide (LASIX) 20 MG tablet   2. How are you currently taking this medication (dosage and times per day)?   3. Are you having a reaction (difficulty breathing--STAT)?   4. What is your medication issue?   Patient called to report that his Lasix medication was changed from 20 mg a day to 60 mg a day as needed.  Patient stated he had gained 10 pound in 4 day and the increased dosage did bring his weight down by 5 pounds.  Patient also noted that he has been wheezing which has got worse since he had gained the 10 pounds and has been feeling tired all the time.

## 2023-11-24 DIAGNOSIS — E1165 Type 2 diabetes mellitus with hyperglycemia: Secondary | ICD-10-CM | POA: Diagnosis not present

## 2023-11-24 NOTE — Telephone Encounter (Signed)
Left a message for the patient to call back.    Furth, Cadence H, PA-C  Sandi Mariscal, RN53 minutes ago (8:00 AM)    We can go back up to lasix 40mg  daily. BMET in 2 weeks.

## 2023-11-25 MED ORDER — FUROSEMIDE 40 MG PO TABS: 40.0000 mg | ORAL_TABLET | Freq: Every day | ORAL | 3 refills | Status: DC

## 2023-11-25 NOTE — Telephone Encounter (Signed)
Pt made aware of Cadence's recommendations and verbalized understanding.  Medication list updated to lasix 40 mg daily and lab orders placed

## 2023-11-25 NOTE — Addendum Note (Signed)
Addended by: Parke Poisson on: 11/25/2023 04:06 PM   Modules accepted: Orders

## 2023-11-25 NOTE — Telephone Encounter (Signed)
Received the follow error message from pharmacy. Nurse contacted pharmacy and provided the following verbal order  Increase lasix to 40 mg daily   An error occurred while processing the e-prescribing message.   The message was not sent electronically to the requested pharmacy. Contact the pharmacy about the new prescription.   Code: 600 - Communication problem - try again later  Description Code: Code: 008 - Request timed out before response could be received.  Internal error.Contact pharmacy by other means   E-Prescribing Status: Transmission to pharmacy failed (11/25/2023  4:10 PM EST)

## 2023-11-25 NOTE — Telephone Encounter (Signed)
 Patient is returning call and is requesting call back.

## 2023-11-26 ENCOUNTER — Ambulatory Visit: Payer: Medicare Other | Admitting: Physical Therapy

## 2023-11-29 ENCOUNTER — Telehealth: Payer: Self-pay | Admitting: Anesthesiology

## 2023-11-29 NOTE — Telephone Encounter (Signed)
 Dr. Pernell Dupre said he will order an epidural. Will you please call patient to scheduled?

## 2023-11-29 NOTE — Addendum Note (Signed)
 Addended by: Yevette Edwards on: 11/29/2023 10:00 AM   Modules accepted: Orders

## 2023-11-29 NOTE — Telephone Encounter (Signed)
 PT called stated that at his last vv, mm appt he discuss getting a cortisone shot.Pls give patient a call. TY

## 2023-11-30 ENCOUNTER — Ambulatory Visit: Payer: Medicare Other | Admitting: Anesthesiology

## 2023-11-30 ENCOUNTER — Other Ambulatory Visit: Payer: Self-pay | Admitting: Nurse Practitioner

## 2023-12-01 ENCOUNTER — Ambulatory Visit: Payer: Medicare Other | Admitting: Licensed Clinical Social Worker

## 2023-12-01 ENCOUNTER — Ambulatory Visit: Payer: Medicare Other | Admitting: Physical Therapy

## 2023-12-01 DIAGNOSIS — F331 Major depressive disorder, recurrent, moderate: Secondary | ICD-10-CM

## 2023-12-01 DIAGNOSIS — F439 Reaction to severe stress, unspecified: Secondary | ICD-10-CM | POA: Diagnosis not present

## 2023-12-01 NOTE — Progress Notes (Signed)
 THERAPIST PROGRESS NOTE  Session Time: 11-11:46am  Participation Level: Active  Behavioral Response: CasualAlertAnxious  Type of Therapy: Individual Therapy  Treatment Goals addressed:      Goal: STG: [name] will identify situations, thoughts, and feelings that trigger internal anger, and/or angry/aggressive actions as evidenced by self-report                                       Goal: LTG: Pt reports he would like to "control anger issues"                                                                                                                                                        Goal: STG: [name] will learn 2 coping to practice daily to decrease the frequency and severity of anxious symptoms.                                         Goal: Level of anxiety will decrease                   ProgressTowards Goals: Progressing  Interventions: Supportive and Other: EMDR  Summary:[name] is a 63 y.o. male who presents with symptoms of anxiety. Patient reports symptoms to include uncontrollable worry, anxious feelings, avoidance, tension, restlessness. Pt was oriented times 5. Pt was cooperative and engaged. Pt denies SI/HI/AVH.   Patient utilized therapeutic space to process "tumultuous "past few weeks.  Patient expressed anxiety about his health and stress regarding his ongoing legal battles.  Clinician assessed for use of coping skills.  Patient cites improvements as coping skills have helped decrease symptoms of anxiety.  Clinician continue to assess for readiness to begin EMDR treatment.  Patient expressed some hesitancy and anxiety stress and trauma history.  Clinician worked with patient on constructing his target sequence plan for the cognition "I am not good enough."  Patient recalled previous experiences that have reinforced this negative belief and worked with clinician to identify an adaptive desired positive belief stating  "there is not anything wrong with who I am."  Patient will begin processing his target sequence plan in the next session.  Suicidal/Homicidal: Nowithout intent/plan  Therapist Response: Cln utilized active and supportive reflection to create safe environment for patient to process recent life stressors. Clinician assessed for current symptoms, stressors, safety since last session.  Clinician utilized EMDR to engage in constructing patients target sequence plan.   Plan: Return again in 2 weeks.  Diagnosis: MDD (major depressive disorder), recurrent episode, moderate (HCC)  Trauma and stressor-related disorder   Collaboration of Care: Check in with the patient and will see  LCSW per availability. Patient agreed with treatment recommendations.   Patient/Guardian was advised Release of Information must be obtained prior to any record release in order to collaborate their care with an outside provider. Patient/Guardian was advised if they have not already done so to contact the registration department to sign all necessary forms in order for [place] to release information regarding their care.   Consent: Patient/Guardian gives verbal consent for treatment and assignment of benefits for services provided during this visit. Patient/Guardian expressed understanding and agreed to proceed.   Dereck Leep, LCSW 12/01/2023

## 2023-12-02 ENCOUNTER — Ambulatory Visit (INDEPENDENT_AMBULATORY_CARE_PROVIDER_SITE_OTHER): Payer: Medicare Other | Admitting: Family Medicine

## 2023-12-02 ENCOUNTER — Other Ambulatory Visit: Payer: Self-pay | Admitting: Family Medicine

## 2023-12-02 VITALS — BP 106/64 | HR 95 | Resp 16 | Ht 67.0 in | Wt 214.4 lb

## 2023-12-02 DIAGNOSIS — M4697 Unspecified inflammatory spondylopathy, lumbosacral region: Secondary | ICD-10-CM

## 2023-12-02 DIAGNOSIS — E1142 Type 2 diabetes mellitus with diabetic polyneuropathy: Secondary | ICD-10-CM | POA: Diagnosis not present

## 2023-12-02 DIAGNOSIS — Z794 Long term (current) use of insulin: Secondary | ICD-10-CM | POA: Diagnosis not present

## 2023-12-02 DIAGNOSIS — N1831 Chronic kidney disease, stage 3a: Secondary | ICD-10-CM | POA: Diagnosis not present

## 2023-12-02 DIAGNOSIS — J302 Other seasonal allergic rhinitis: Secondary | ICD-10-CM | POA: Diagnosis not present

## 2023-12-02 DIAGNOSIS — I5020 Unspecified systolic (congestive) heart failure: Secondary | ICD-10-CM | POA: Diagnosis not present

## 2023-12-02 DIAGNOSIS — K5903 Drug induced constipation: Secondary | ICD-10-CM

## 2023-12-02 DIAGNOSIS — J4540 Moderate persistent asthma, uncomplicated: Secondary | ICD-10-CM

## 2023-12-02 DIAGNOSIS — F331 Major depressive disorder, recurrent, moderate: Secondary | ICD-10-CM

## 2023-12-02 DIAGNOSIS — G7000 Myasthenia gravis without (acute) exacerbation: Secondary | ICD-10-CM | POA: Diagnosis not present

## 2023-12-02 DIAGNOSIS — I700 Atherosclerosis of aorta: Secondary | ICD-10-CM | POA: Diagnosis not present

## 2023-12-02 DIAGNOSIS — E063 Autoimmune thyroiditis: Secondary | ICD-10-CM

## 2023-12-02 DIAGNOSIS — I200 Unstable angina: Secondary | ICD-10-CM

## 2023-12-02 DIAGNOSIS — T402X5A Adverse effect of other opioids, initial encounter: Secondary | ICD-10-CM

## 2023-12-02 DIAGNOSIS — R21 Rash and other nonspecific skin eruption: Secondary | ICD-10-CM

## 2023-12-02 MED ORDER — LUBIPROSTONE 8 MCG PO CAPS: 8.0000 ug | ORAL_CAPSULE | Freq: Two times a day (BID) | ORAL | 1 refills | Status: DC

## 2023-12-02 MED ORDER — LEVOCETIRIZINE DIHYDROCHLORIDE 5 MG PO TABS: 5.0000 mg | ORAL_TABLET | Freq: Every day | ORAL | 1 refills | Status: DC

## 2023-12-02 MED ORDER — DULOXETINE HCL 60 MG PO CPEP: 60.0000 mg | ORAL_CAPSULE | Freq: Every day | ORAL | 1 refills | Status: DC

## 2023-12-02 MED ORDER — DULOXETINE HCL 30 MG PO CPEP: 30.0000 mg | ORAL_CAPSULE | Freq: Every day | ORAL | 1 refills | Status: DC

## 2023-12-02 MED ORDER — LEVOTHYROXINE SODIUM 50 MCG PO TABS: ORAL_TABLET | ORAL | 0 refills | Status: DC

## 2023-12-02 NOTE — Progress Notes (Addendum)
 Name: [name]   MRN: [id number]    DOB: 1961-09-05   Date:12/02/2023       Progress Note  Subjective  Chief Complaint  Chief Complaint  Patient presents with   Medical Management of Chronic Issues   Back Pain    Lower right side radiates down to hip and thigh ongoing for 3 weeks   HPI   Asthma Moderate Persistent:  He is under the care of Dr. Meredeth Ide , taking xyzal , Breo and singulair. He states mild seems to be stable , he has chronic cough and wheezing, also orthopnea that may be secondary to CHF   DMII:  he is under the care of Dr. Gershon Crane , last A1C was 7.8 % in Dec and has an upcoming follow up, switching to a pump soon.  He has associated dyslipidemia, neuropathy, and history of  microalbuminuria, also CKI stage IIIa based on two levels of GFR below 50 .  He is on statin therapy, gabapentin for neuropathy  . Taking insulin, Ozempic ,  Metformin , SGL2 agonist - but low dose for CHF.    Myasthenia Gravis: he is currently seeing Dr. Sherryll Burger, he has mild ptosis on left side but not affecting his vision, he has sometimes generalized weakness. He has an up to date with follow ups, took mediation in the past but no improvement of symptoms so he is off medication   CHF with reduced EF/CAD with angina: under the care of cardiologist, he is on statin therapy, SGL-2 agonist, lasix daily now and NTG but down to a couple times  a month when he has chest pain. He used to take ARB and beta blocker but medications were stopped due to low BP. He still has orthopnea and dry cough that may be multifactorial    Hypothyroidism: he is currently on 50 mcg of levothyroxine daily but two on Sundays but has been out of medication for the few weeks.    MDD: he has a long history of depression, taking Duloxetine 90 mg daily , it works well for him. He states father died - he had Alzheimer's , he still has stress but coping well, saw psychiatrist once but now seeing therapist    Chronic pain/inflammatory  spondyloarthropathy:  he sees pain sub-specialist, Dr. Pernell Dupre,  every 2 months.  He has daily back pain , he states lately pain is going down both legs and has numbness and weakness on both lower legs when he first stands up. He has opioid induced constipation and takes Amitiza 8 mcg twice daily to control symptoms. He has noticed a new pain going from right lower back to groin, advised to discuss with pain doctor   IMPRESSION: No findings of discitis or osteomyelitis. Mild multilevel lumbar spondylosis, primarily facet arthropathy, with some disc space narrowing L2-L3.    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) 12/02/2021   Gastropathy 08/13/2021   Iron deficiency anemia due to  chronic blood loss    Pain due to onychomycosis of toenails of both feet 11/11/2020   Lung involvement associated with another disorder (HCC) 05/17/2020   Lower urinary tract symptoms 09/15/2019   Incomplete bladder emptying 09/15/2019   Carpal tunnel syndrome on both sides 07/12/2019   Idiopathic peripheral neuropathy 04/21/2019   Sensory ataxia 03/09/2019   OSA on CPAP 07/08/2018   Congestive heart failure (CHF) (HCC) 05/05/2018   Unstable angina (HCC) 04/26/2018   Ischemic cardiomyopathy 04/26/2018   Chronic combined systolic and diastolic CHF (congestive heart failure) (HCC) 04/26/2018   Anginal equivalent (HCC) 04/25/2018   Inflammatory spondylopathy  of lumbosacral region (HCC) 01/25/2018   Vitamin D deficiency, unspecified 08/12/2017   Ptosis of left eyelid 01/08/2017   Dermatitis 12/24/2016   Benign neoplasm of sigmoid colon    Benign neoplasm of descending colon    Benign neoplasm of transverse colon    Coronary artery disease involving native coronary artery with angina pectoris (HCC) 10/22/2015   Coronary artery disease 08/22/2015   Myasthenia gravis (HCC) 08/22/2015   Chronic left-sided low back pain 08/22/2015   Hypertension 08/22/2015   GERD (gastroesophageal reflux disease) 08/22/2015   Hyperlipidemia 08/22/2015   Insomnia 08/22/2015   Type 2 diabetes mellitus with diabetic polyneuropathy, with long-term current use of insulin (HCC) 08/22/2015   Asthma 08/22/2015   Lyme disease 06/05/1992    Past Surgical History:  Procedure Laterality Date   BILATERAL CARPAL TUNNEL RELEASE Bilateral L in 2012 and R in 2013   CARDIAC CATHETERIZATION     Several Caths, most recent in  March 2016.   COLONOSCOPY WITH PROPOFOL N/A 01/10/2016   Procedure: COLONOSCOPY WITH PROPOFOL;  Surgeon: Midge Minium, MD;  Location: [facility];  Service: Endoscopy;  Laterality: N/A;   COLONOSCOPY WITH PROPOFOL N/A 04/14/2021   Procedure: COLONOSCOPY WITH PROPOFOL;  Surgeon: Midge Minium, MD;  Location: [facility];  Service: Endoscopy;  Laterality: N/A;  Diabetic - insulin and oral meds   CORONARY ANGIOPLASTY     CORONARY STENT INTERVENTION N/A 04/25/2018   Procedure: CORONARY STENT INTERVENTION;  Surgeon: Iran Ouch, MD;  Location: [facility];  Service: Cardiovascular;  Laterality: N/A;   ESOPHAGOGASTRODUODENOSCOPY (EGD) WITH PROPOFOL N/A 01/10/2016   Procedure: ESOPHAGOGASTRODUODENOSCOPY (EGD) WITH PROPOFOL;  Surgeon: Midge Minium, MD;  Location: [facility];  Service: Endoscopy;  Laterality: N/A;   ESOPHAGOGASTRODUODENOSCOPY (EGD) WITH PROPOFOL N/A 04/14/2021   Procedure: ESOPHAGOGASTRODUODENOSCOPY (EGD) WITH BIOPSY;  Surgeon:  Midge Minium, MD;  Location: [facility];  Service: Endoscopy;  Laterality: N/A;   EYE SURGERY Bilateral 2012   cataract/bilateral vitrectomies   GIVENS CAPSULE STUDY N/A 08/20/2021   Procedure: GIVENS CAPSULE STUDY;  Surgeon: Midge Minium, MD;  Location: [facility];  Service: Endoscopy;  Laterality: N/A;   LEFT HEART CATH AND CORONARY ANGIOGRAPHY Left 04/25/2018   Procedure: LEFT HEART CATH AND CORONARY ANGIOGRAPHY;  Surgeon: Iran Ouch, MD;  Location: [facility];  Service: Cardiovascular;  Laterality: Left;   LEFT HEART CATH AND CORONARY ANGIOGRAPHY Left 02/02/2022   Procedure: LEFT HEART CATH AND CORONARY ANGIOGRAPHY;  Surgeon: Iran Ouch, MD;  Location: [facility];  Service: Cardiovascular;  Laterality: Left;   TEE WITHOUT CARDIOVERSION N/A 09/05/2018   Procedure: TRANSESOPHAGEAL ECHOCARDIOGRAM (TEE);  Surgeon: Iran Ouch, MD;  Location: [facility];  Service: Cardiovascular;  Laterality: N/A;   TONSILLECTOMY AND ADENOIDECTOMY     As a child   TUNNELED VENOUS CATHETER PLACEMENT  removed    Family History  Problem Relation Age of Onset   Anxiety disorder Mother    Depression Mother    Diabetes Mother    Heart disease Mother    Cancer Father        Prostate CA, Anal cancer    Dementia Father    Diabetes Brother    Healthy Brother    Healthy Brother     Social History   Tobacco Use   Smoking status: Never   Smokeless tobacco: Never   Tobacco comments:    smoking cessation materials not required  Substance Use Topics   Alcohol use: Not Currently    Alcohol/week: 0.0 standard drinks of alcohol     Current Outpatient Medications:    albuterol (PROVENTIL) (2.5 MG/3ML) 0.083% nebulizer solution, Take 3 mLs (2.5 mg total) by nebulization every 6 (six) hours as needed for wheezing or shortness of breath., Disp: 75 mL, Rfl: 2   albuterol (VENTOLIN HFA) 108 (90 Base) MCG/ACT inhaler, Inhale 2 puffs into the lungs daily., Disp: ,  Rfl:    ammonium lactate (AMLACTIN) 12 % lotion, Apply 1 Application topically as needed for dry skin., Disp: 400 g, Rfl: 3   aspirin EC 81 MG tablet, Take 1 tablet (81 mg total) by mouth daily., Disp: 90 tablet, Rfl: 3   BD PEN NEEDLE NANO 2ND GEN 32G X 4 MM MISC, USE AS DIRECTED 5 TO 6 TIMES DAILY DEPENDING ON MEALS AND BLOOD SUGAR, Disp: , Rfl:    clopidogrel (PLAVIX) 75 MG tablet, TAKE 1 TABLET BY MOUTH DAILY WITH BREAKFAST, Disp: 90 tablet, Rfl: 0   cyclobenzaprine (FLEXERIL) 10 MG tablet, Take 1 tablet (10 mg total) by mouth 2 (two) times daily. TAKE 1 TABLET(10 MG) BY MOUTH TWICE DAILY AS NEEDED, Disp: 60 tablet, Rfl: 3   dexlansoprazole (DEXILANT) 60 MG capsule, Take 1 capsule (60 mg total) by mouth daily., Disp: 30 capsule, Rfl: 2   DULoxetine (CYMBALTA) 30 MG capsule, TAKE 1 CAPSULE BY MOUTH EVERY DAY, WITH ONE 60 MG CAPSULE, Disp: 90 capsule, Rfl: 0   DULoxetine (CYMBALTA) 60 MG capsule, TAKE 1 CAPSULE(60 MG) BY MOUTH DAILY, Disp: 90 capsule, Rfl: 1   empagliflozin (JARDIANCE) 10 MG TABS tablet, TAKE 1 TABLET(10 MG) BY MOUTH DAILY BEFORE BREAKFAST, Disp: 90 tablet, Rfl: 3   ezetimibe (ZETIA) 10 MG tablet, TAKE 1 TABLET(10 MG) BY MOUTH DAILY, Disp: 90 tablet, Rfl: 0   fluticasone furoate-vilanterol (BREO ELLIPTA) 200-25 MCG/ACT AEPB, Inhale 1 puff into the lungs daily., Disp: , Rfl:    furosemide (LASIX) 40 MG tablet, Take 1 tablet (40 mg total) by mouth daily., Disp: 90 tablet, Rfl: 3   gabapentin (NEURONTIN) 300 MG capsule, Take 1 capsule (300 mg total) by mouth 3 (three) times daily., Disp: 90 capsule, Rfl: 1   gabapentin (NEURONTIN) 800 MG tablet, Take 800 mg by mouth at bedtime., Disp: , Rfl:    insulin lispro (HUMALOG) 100 UNIT/ML KiwkPen, Inject 6-10 Units into the skin 3 (three) times daily.  sliding scale 1 unit for every 10 units of carbs, Disp: , Rfl:    ketoconazole (NIZORAL) 2 % cream, Apply 1 Application topically daily., Disp: 60 g, Rfl: 0   LANTUS SOLOSTAR 100 UNIT/ML  Solostar Pen, Inject 20-23 Units into the skin daily at 10 pm. Sliding scale based on blood glucose, Disp: , Rfl: 0   levocetirizine (XYZAL) 5 MG tablet, TAKE 1 TABLET(5 MG) BY MOUTH EVERY EVENING, Disp: 90 tablet, Rfl: 0  levothyroxine (SYNTHROID) 50 MCG tablet, TAKE 1 TABLET BY MOUTH EVERY DAY ON MONDAY THROUGH SATURDAY, 2 TABLETS DAILY ON SUNDAY, Disp: 102 tablet, Rfl: 0   loratadine (CLARITIN) 10 MG tablet, Take 10 mg by mouth in the morning., Disp: , Rfl:    lubiprostone (AMITIZA) 8 MCG capsule, TAKE 1 CAPSULE(8 MCG) BY MOUTH TWICE DAILY WITH A MEAL, Disp: 180 capsule, Rfl: 0   metFORMIN (GLUCOPHAGE) 1000 MG tablet, Take 1,000 mg by mouth 2 (two) times daily with a meal., Disp: , Rfl:    montelukast (SINGULAIR) 10 MG tablet, Take 10 mg by mouth at bedtime., Disp: , Rfl:    Multiple Vitamins-Minerals (MENS MULTI VITAMIN & MINERAL PO), Take 1 tablet by mouth in the morning and at bedtime., Disp: , Rfl:    naloxone (NARCAN) nasal spray 4 mg/0.1 mL, For excess sedation from opioids, Disp: 1 kit, Rfl: 2   nitroGLYCERIN (NITROSTAT) 0.3 MG SL tablet, DISSOLVE 1 TABLET UNDER THE TONGUE EVERY 5 MINUTES AS NEEDED FOR CHEST PAIN, MAX 3 DOSES, Disp: 25 tablet, Rfl: 0   Oxycodone HCl 10 MG TABS, Take 1 tablet (10 mg total) by mouth in the morning, at noon, in the evening, and at bedtime., Disp: 120 tablet, Rfl: 0   rosuvastatin (CRESTOR) 5 MG tablet, Take 5 mg by mouth daily with supper., Disp: , Rfl: 0   Semaglutide, 2 MG/DOSE, 8 MG/3ML SOPN, Inject into the skin., Disp: , Rfl:    Tiotropium Bromide Monohydrate (SPIRIVA RESPIMAT) 1.25 MCG/ACT AERS, INHALE 2 PUFFS INTO THE LUNGS DAILY, Disp: 12 g, Rfl: 0   amoxicillin-clavulanate (AUGMENTIN) 875-125 MG tablet, Take 1 tablet by mouth 2 (two) times daily. (Patient not taking: Reported on 09/21/2023), Disp: 20 tablet, Rfl: 0   azithromycin (ZITHROMAX) 250 MG tablet, Take 500mg  PO daily x1d and then 250mg  daily x4 days (Patient not taking: Reported on  09/21/2023), Disp: 6 each, Rfl: 0   predniSONE (DELTASONE) 20 MG tablet, Take 60mg  PO daily x 2 days, then40mg  PO daily x 2 days, then 20mg  PO daily x 3 days (Patient not taking: Reported on 09/21/2023), Disp: 13 tablet, Rfl: 0   Semaglutide, 1 MG/DOSE, 2 MG/1.5ML SOPN, Inject 1 mg into the skin daily at 12 noon. (Patient not taking: Reported on 12/02/2023), Disp: , Rfl:   Current Facility-Administered Medications:    sodium chloride flush (NS) 0.9 % injection 3 mL, 3 mL, Intravenous, Q12H, Furth, Cadence H, PA-C  Allergies  Allergen Reactions   Azathioprine Other (See Comments)    Azathioprine hypersensitivity reaction - Symptoms mimicking sepsis - was hospitalized   Novolog [Insulin Aspart (Human Analog)] Hives    I personally reviewed active problem list, medication list, allergies, family history with the patient/caregiver today.   ROS  Ten systems reviewed and is negative except as mentioned in HPI    Objective  Vitals:   12/02/23 0924  BP: 106/64  Pulse: 95  Resp: 16  SpO2: 96%  Weight: 214 lb 6.4 oz (97.3 kg)  Height: 5\' 7"  (1.702 m)    Body mass index is 33.58 kg/m.  Physical Exam  Constitutional: Patient appears well-developed and well-nourished. Obese  No distress.  HEENT: head atraumatic, normocephalic, pupils equal and reactive to light, neck supple, left ptosis  Cardiovascular: Normal rate, regular rhythm and normal heart sounds.  No murmur heard. No BLE edema. Pulmonary/Chest: Effort normal , ins and expiratory wheezing. No respiratory distress. Abdominal: Soft.  There is no tenderness. Psychiatric: Patient has a normal mood and affect.  behavior is normal. Judgment and thought content normal.   Recent Results (from the past 2160 hours)  Tox[employer] Select 13 (MW), Urine     Status: None   Collection Time: 09/21/23  3:22 PM  Result Value Ref Range   Summary FINAL     Comment: ==================================================================== [employer]  Select 13 (MW) ==================================================================== Test                             Result       Flag       Units  Drug Present and Declared for Prescription Verification   Oxycodone                      1773         EXPECTED   ng/mg creat   Oxymorphone                    547          EXPECTED   ng/mg creat   Noroxycodone                   8382         EXPECTED   ng/mg creat   Noroxymorphone                 571          EXPECTED   ng/mg creat    Sources of oxycodone are scheduled prescription medications.    Oxymorphone, noroxycodone, and noroxymorphone are expected    metabolites of oxycodone. Oxymorphone is also available as a    scheduled prescription medication.  ==================================================================== Test                      Result    Flag   Units      Ref Range   Creatinine              49               mg/dL      >=16 == ================================================================== Declared Medications:  The flagging and interpretation on this report are based on the  following declared medications.  Unexpected results may arise from  inaccuracies in the declared medications.   **Note: The testing scope of this panel includes these medications:   Oxycodone   **Note: The testing scope of this panel does not include the  following reported medications:   Albuterol (Proventil HFA)  Ammonium Hydroxide (Ammonium Lactate)  Amoxicillin (Augmentin)  Aspirin  Azithromycin (Zithromax)  Clavulanate (Augmentin)  Clopidogrel (Plavix)  Cyclobenzaprine (Flexeril)  Dexlansoprazole (Dexilant)  Duloxetine (Cymbalta)  Empagliflozin (Jardiance)  Ezetimibe (Zetia)  Fluticasone (Breo)  Furosemide (Lasix)  Gabapentin (Neurontin)  Insulin (Lantus)  Ketoconazole (Nizoral)  Lactic Acid (Ammonium Lactate)  Levocetirizine (Xyzal)  Levothyroxine (Synthroid)  Loratadine (Claritin)  Lubiproston e (Amitiza)  Metformin  (Glucophage)  Montelukast (Singulair)  Multivitamin  Naloxone (Narcan)  Nitroglycerin (Nitrostat)  Prednisone (Deltasone)  Rosuvastatin (Crestor)  Semaglutide  Tiotropium (Spiriva)  Vilanterol (Breo) ==================================================================== For clinical consultation, please call [phone]. ====================================================================     Diabetic Foot Exam:     PHQ2/9:    12/02/2023    9:23 AM 09/21/2023    2:41 PM 09/14/2023   11:07 AM 07/26/2023    2:00 PM 07/23/2023    1:36 PM  Depression screen PHQ 2/9  Decreased Interest 1 0 0  0  Down, Depressed, Hopeless 1 0 0  0  PHQ - 2 Score 2 0 0  0  Altered sleeping 1      Tired, decreased energy 1      Change in appetite 1      Feeling bad or failure about yourself  0      Trouble concentrating 1      Moving slowly or fidgety/restless 0      Suicidal thoughts 0      PHQ-9 Score 6      Difficult doing work/chores Somewhat difficult         Information is confidential and restricted. Go to Review Flowsheets to unlock data.    phq 9 is positive   Fall Risk:    09/21/2023    2:41 PM 09/14/2023   11:06 AM 07/20/2023    2:59 PM 06/24/2023    2:44 PM 03/24/2023    1:40 PM  Fall Risk   Falls in the past year? 0 1 1 0 0  Number falls in past yr:  1 1 0   Injury with Fall?  0 0 0   Risk for fall due to :  No Fall Risks Impaired balance/gait;Impaired mobility;History of fall(s) No Fall Risks   Follow up  Falls prevention discussed Education provided;Falls prevention discussed       Assessment & Plan  1. Moderate episode of recurrent major depressive disorder (HCC) (Primary)  - DULoxetine (CYMBALTA) 30 MG capsule; Take 1 capsule (30 mg total) by mouth daily.  Dispense: 90 capsule; Refill: 1 - DULoxetine (CYMBALTA) 60 MG capsule; Take 1 capsule (60 mg total) by mouth daily.  Dispense: 90 capsule; Refill: 1  2. Myasthenia gravis [facility])  Keep visits with Dr.  Sherryll Burger  3. Type 2 diabetes mellitus with diabetic polyneuropathy, with long-term current use of insulin (HCC)  Under the care of Endo  4. HFrEF (heart failure with reduced ejection fraction) (HCC)  Taking medications, under the care of cardiologist   5. Inflammatory spondylopathy of lumbosacral region [facility])  Under the care of Dr. Pernell Dupre  6. Unstable angina (HCC)  stable  7. Atherosclerosis of aorta (HCC)  - Lipid panel  8. Hypothyroidism, acquired, autoimmune  - TSH - levothyroxine (SYNTHROID) 50 MCG tablet; TAKE 1 TABLET BY MOUTH EVERY DAY ON MONDAY THROUGH SATURDAY, 2 TABLETS DAILY ON SUNDAY  Dispense: 102 tablet; Refill: 0  9. Stage 3a chronic kidney disease (HCC)  - COMPLETE METABOLIC PANEL WITH GFR - CBC with Differential/Platelet - VITAMIN D 25 Hydroxy (Vit-D Deficiency, Fractures)  10. Seasonal allergies  - levocetirizine (XYZAL) 5 MG tablet; Take 1 tablet (5 mg total) by mouth daily at 12 noon.  Dispense: 90 tablet; Refill: 1  11. Constipation due to opioid therapy  - lubiprostone (AMITIZA) 8 MCG capsule; Take 1 capsule (8 mcg total) by mouth 2 (two) times daily with a meal.  Dispense: 180 capsule; Refill: 1  12. Rash in adult  - Ambulatory referral to Dermatology  13. Moderate persistent asthma, unspecified whether complicated   Keep follow up with pulmonologist

## 2023-12-03 ENCOUNTER — Encounter: Payer: Self-pay | Admitting: Family Medicine

## 2023-12-03 ENCOUNTER — Ambulatory Visit: Payer: Medicare Other | Attending: Neurology | Admitting: Physical Therapy

## 2023-12-03 ENCOUNTER — Other Ambulatory Visit: Payer: Self-pay

## 2023-12-03 DIAGNOSIS — H02403 Unspecified ptosis of bilateral eyelids: Secondary | ICD-10-CM | POA: Diagnosis not present

## 2023-12-03 DIAGNOSIS — E1139 Type 2 diabetes mellitus with other diabetic ophthalmic complication: Secondary | ICD-10-CM | POA: Diagnosis not present

## 2023-12-03 DIAGNOSIS — M5459 Other low back pain: Secondary | ICD-10-CM | POA: Insufficient documentation

## 2023-12-03 DIAGNOSIS — R262 Difficulty in walking, not elsewhere classified: Secondary | ICD-10-CM | POA: Diagnosis not present

## 2023-12-03 DIAGNOSIS — M6281 Muscle weakness (generalized): Secondary | ICD-10-CM | POA: Insufficient documentation

## 2023-12-03 DIAGNOSIS — G7001 Myasthenia gravis with (acute) exacerbation: Secondary | ICD-10-CM | POA: Diagnosis not present

## 2023-12-03 DIAGNOSIS — R269 Unspecified abnormalities of gait and mobility: Secondary | ICD-10-CM | POA: Diagnosis not present

## 2023-12-03 DIAGNOSIS — H40003 Preglaucoma, unspecified, bilateral: Secondary | ICD-10-CM | POA: Diagnosis not present

## 2023-12-03 DIAGNOSIS — E113593 Type 2 diabetes mellitus with proliferative diabetic retinopathy without macular edema, bilateral: Secondary | ICD-10-CM | POA: Diagnosis not present

## 2023-12-03 LAB — LIPID PANEL
Cholesterol: 113 mg/dL (ref <200)
HDL: 45 mg/dL (ref >=40)
LDL Cholesterol (Calc): 46 mg/dL
Non-HDL Cholesterol (Calc): 68 mg/dL (ref <130)
Total CHOL/HDL Ratio: 2.5 (calc) (ref <5.0)
Triglycerides: 140 mg/dL (ref <150)

## 2023-12-03 LAB — COMPLETE METABOLIC PANEL WITH GFR
AG Ratio: 2 (calc) (ref 1.0–2.5)
ALT: 21 U/L (ref 9–46)
AST: 21 U/L (ref 10–35)
Albumin: 4.3 g/dL (ref 3.6–5.1)
Alkaline phosphatase (APISO): 59 U/L (ref 35–144)
BUN/Creatinine Ratio: 15 (calc) (ref 6–22)
BUN: 24 mg/dL (ref 7–25)
CO2: 35 mmol/L — ABNORMAL HIGH (ref 20–32)
Calcium: 10.2 mg/dL (ref 8.6–10.3)
Chloride: 99 mmol/L (ref 98–110)
Creat: 1.6 mg/dL — ABNORMAL HIGH (ref 0.70–1.35)
Globulin: 2.1 g/dL (ref 1.9–3.7)
Glucose, Bld: 192 mg/dL — ABNORMAL HIGH (ref 65–99)
Potassium: 4.6 mmol/L (ref 3.5–5.3)
Sodium: 142 mmol/L (ref 135–146)
Total Bilirubin: 0.4 mg/dL (ref 0.2–1.2)
Total Protein: 6.4 g/dL (ref 6.1–8.1)
eGFR: 48 mL/min/{1.73_m2} — ABNORMAL LOW (ref >=60)

## 2023-12-03 LAB — CBC WITH DIFFERENTIAL/PLATELET
Absolute Lymphocytes: 1191 {cells}/uL (ref 850–3900)
Absolute Monocytes: 442 {cells}/uL (ref 200–950)
Basophils Absolute: 42 {cells}/uL (ref 0–200)
Basophils Relative: 0.8 %
Eosinophils Absolute: 432 {cells}/uL (ref 15–500)
Eosinophils Relative: 8.3 %
HCT: 42.5 % (ref 38.5–50.0)
Hemoglobin: 14 g/dL (ref 13.2–17.1)
MCH: 30.2 pg (ref 27.0–33.0)
MCHC: 32.9 g/dL (ref 32.0–36.0)
MCV: 91.8 fL (ref 80.0–100.0)
MPV: 10 fL (ref 7.5–12.5)
Monocytes Relative: 8.5 %
Neutro Abs: 3094 {cells}/uL (ref 1500–7800)
Neutrophils Relative %: 59.5 %
Platelets: 231 10*3/uL (ref 140–400)
RBC: 4.63 10*6/uL (ref 4.20–5.80)
RDW: 12.4 % (ref 11.0–15.0)
Total Lymphocyte: 22.9 %
WBC: 5.2 10*3/uL (ref 3.8–10.8)

## 2023-12-03 LAB — VITAMIN D 25 HYDROXY (VIT D DEFICIENCY, FRACTURES): Vit D, 25-Hydroxy: 54 ng/mL (ref 30–100)

## 2023-12-03 LAB — TSH: TSH: 3.4 m[IU]/L (ref 0.40–4.50)

## 2023-12-03 NOTE — Therapy (Signed)
 OUTPATIENT PHYSICAL THERAPY NEURO EVALUATION   Patient Name: [name] MRN: [id number] DOB:1961/10/02, 63 y.o., male Today's Date: 12/03/2023   PCP: [name], MD  REFERRING PROVIDER: Lonell Face, MD   END OF SESSION:  PT End of Session - 12/03/23 0929     Visit Number 1    Number of Visits 24    Date for PT Re-Evaluation 02/25/24    PT Start Time 0930    PT Stop Time 1015    PT Time Calculation (min) 45 min    Equipment Utilized During Treatment Gait belt    Activity Tolerance Patient limited by pain;Patient limited by fatigue    Behavior During Therapy [facility] for tasks assessed/performed             Past Medical History:  Diagnosis Date   Allergy    dust, seasonal (worse in the fall).   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 Followed by Pain Specialist in [place], back and neck   Asthma    BRONCHITIS   Cataract    First Dx in 2012   Chronic combined systolic and diastolic congestive heart failure (HCC)    a. 03/2018 Echo: EF 30-35%; b. 07/2018 Echo: EF 35-40%; c. 09/2019 TEE: EF 40-45%; d. 12/2020 Echo: EF 35-40%; e.01/2022  Echo: EF 45-50%. sev apical HK; f. 01/2023 Echo: EF 40-45%, mod asymm basal-septal LVH, apical AK, nl RV fxn.   CKD (chronic kidney disease), stage II    Coronary artery disease    a. Prior Ant MI->s/p mult stents->LAD/RCA ([place]); b. 2016 Cath: nonobs dzs;  c. 04/2018 Cath/PCI: LCX 65m (3.25x15 Sierra DES); d. 02/2022 MV: high risk; e. 02/2022 Cath: LM nl, LAD 20p ISR, patent mid-stent, LCX patent stent, OM1 nl, OM2 50p, patent stent, OM3 40p, patent stent, RCA 40p ISR, 49m ISR, 41m/d, patent distal stent, RPDA patent stent, RPAV small, 80 (sl progression)-->Med Rx.   Deaf, left    Diabetes mellitus without complication (HCC)    TYPE 2   Diabetic peripheral neuropathy (HCC)    feet and hands   FUO (fever of unknown origin) 08/03/2018   GERD (gastroesophageal reflux disease)    Headache    muscle tension   Hyperlipidemia    Hypertension     Hyperthyroidism    Insomnia    Ischemic cardiomyopathy    a. 03/2018 Echo: EF 30-35%; b. 07/2018 Echo: EF 35-40%, Gr1 DD; c. 09/2019 TEE: EF 40-45%; d. 12/2020 Echo: EF 35-40%. GrI DD; e. 01/2022 Echo: EF 45-50%; f. 01/2023 Echo: EF 40-45%.   Knee pain, acute 05/06/2020   Left arm weakness 10/04/2019   Left leg weakness 12/01/2019   Lyme disease    Chronic   Myasthenia gravis (HCC)    (03/18/21 - no current treatment - "better than it has ever been" per pt)   Myocardial infarction (HCC) 2010   Palpitations    a. 10/2020 Zio: RSR, 88 avg. 3 brief SVT episodes (max 5 beats @ 128). Rare PACs/PVCs. Triggered events did not correlate w/ significant arrthymia - some w/ sinus tach.   Seasonal allergies    Sepsis (HCC)    a.07/2018 - unknown source. TEE neg for veg 09/2019.   Sleep apnea    CPAP   Wears hearing aid in both ears    Past Surgical History:  Procedure Laterality Date   BILATERAL CARPAL TUNNEL RELEASE Bilateral L in 2012 and R in 2013   CARDIAC CATHETERIZATION  Several Caths, most recent in  March 2016.   COLONOSCOPY WITH PROPOFOL N/A 01/10/2016   Procedure: COLONOSCOPY WITH PROPOFOL;  Surgeon: Midge Minium, MD;  Location: [facility] ENDOSCOPY;  Service: Endoscopy;  Laterality: N/A;   COLONOSCOPY WITH PROPOFOL N/A 04/14/2021   Procedure: COLONOSCOPY WITH PROPOFOL;  Surgeon: Midge Minium, MD;  Location: [facility];  Service: Endoscopy;  Laterality: N/A;  Diabetic - insulin and oral meds   CORONARY ANGIOPLASTY     CORONARY STENT INTERVENTION N/A 04/25/2018   Procedure: CORONARY STENT INTERVENTION;  Surgeon: Iran Ouch, MD;  Location: [facility] INVASIVE CV [facility];  Service: Cardiovascular;  Laterality: N/A;   ESOPHAGOGASTRODUODENOSCOPY (EGD) WITH PROPOFOL N/A 01/10/2016   Procedure: ESOPHAGOGASTRODUODENOSCOPY (EGD) WITH PROPOFOL;  Surgeon: Midge Minium, MD;  Location: [facility] ENDOSCOPY;  Service: Endoscopy;  Laterality: N/A;   ESOPHAGOGASTRODUODENOSCOPY (EGD) WITH PROPOFOL N/A 04/14/2021    Procedure: ESOPHAGOGASTRODUODENOSCOPY (EGD) WITH BIOPSY;  Surgeon: Midge Minium, MD;  Location: [facility];  Service: Endoscopy;  Laterality: N/A;   EYE SURGERY Bilateral 2012   cataract/bilateral vitrectomies   GIVENS CAPSULE STUDY N/A 08/20/2021   Procedure: GIVENS CAPSULE STUDY;  Surgeon: Midge Minium, MD;  Location: [facility] ENDOSCOPY;  Service: Endoscopy;  Laterality: N/A;   LEFT HEART CATH AND CORONARY ANGIOGRAPHY Left 04/25/2018   Procedure: LEFT HEART CATH AND CORONARY ANGIOGRAPHY;  Surgeon: Iran Ouch, MD;  Location: [facility];  Service: Cardiovascular;  Laterality: Left;   LEFT HEART CATH AND CORONARY ANGIOGRAPHY Left 02/02/2022   Procedure: LEFT HEART CATH AND CORONARY ANGIOGRAPHY;  Surgeon: Iran Ouch, MD;  Location: [facility];  Service: Cardiovascular;  Laterality: Left;   TEE WITHOUT CARDIOVERSION N/A 09/05/2018   Procedure: TRANSESOPHAGEAL ECHOCARDIOGRAM (TEE);  Surgeon: Iran Ouch, MD;  Location: [facility] ORS;  Service: Cardiovascular;  Laterality: N/A;   TONSILLECTOMY AND ADENOIDECTOMY     As a child   TUNNELED VENOUS CATHETER PLACEMENT     removed   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) 12/02/2021   Gastropathy 08/13/2021   Iron deficiency anemia due to chronic blood loss    Pain due to onychomycosis of toenails of both feet 11/11/2020   Lung involvement associated with another disorder (HCC) 05/17/2020   Lower urinary tract symptoms 09/15/2019   Incomplete bladder emptying 09/15/2019   Carpal tunnel syndrome on both sides  07/12/2019   Idiopathic peripheral neuropathy 04/21/2019   Sensory ataxia 03/09/2019   OSA on CPAP 07/08/2018   Congestive heart failure (CHF) (HCC) 05/05/2018   Unstable angina (HCC) 04/26/2018   Ischemic cardiomyopathy 04/26/2018   Chronic combined systolic and diastolic CHF (congestive heart failure) (HCC) 04/26/2018   Anginal equivalent (HCC) 04/25/2018   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HCC) 01/25/2018   Vitamin D deficiency, unspecified 08/12/2017   Ptosis of left eyelid 01/08/2017   Dermatitis 12/24/2016   Benign neoplasm of sigmoid colon    Benign neoplasm of descending colon    Benign neoplasm of transverse colon    Coronary artery disease involving native coronary artery with angina pectoris (HCC) 10/22/2015   Coronary artery disease 08/22/2015   Myasthenia gravis (HCC) 08/22/2015  Chronic left-sided low back pain 08/22/2015   Hypertension 08/22/2015   GERD (gastroesophageal reflux disease) 08/22/2015   Hyperlipidemia 08/22/2015   Insomnia 08/22/2015   Type 2 diabetes mellitus with diabetic polyneuropathy, with long-term current use of insulin (HCC) 08/22/2015   Asthma 08/22/2015   Lyme disease 06/05/1992    ONSET DATE: 6months   REFERRING DIAG:  Diagnosis  R26.89 (ICD-10-CM) - Imbalance    THERAPY DIAG:  Other low back pain  Muscle weakness (generalized)  Gait difficulty  Difficulty in walking, not elsewhere classified  Rationale for Evaluation and Treatment: Rehabilitation  SUBJECTIVE:                                                                                                                                                                                             SUBJECTIVE STATEMENT: Hx of mysathenia gravis. States that he has had worsening balance over the last few months, pt utilities SPC with community mobility. States that he has begun using [facility] throughout house with multiple placed throughout house. Pt reports to PT with  chronic low back pain currently 5/10. Dull ache. Pain increase to sharp pain with twisting.   Pt accompanied by: self  PERTINENT HISTORY:   From Neurology: Patient reports severe neuropathy in both his feet and his hands. Reports numbness, tingling, incoordination, electrical shocks, muscle mass, weakness. Does not a tremor in his hands (left > right). Tremor occurs when he first wakes up when he goes to a text someone. Denies burning, itching, coldness. He reports that when he tries to walk, he gets severe weakness in his triceps and in his thighs bilaterally. He says the weakness is what concerns him the most as he does not want to lose his strength. He reports that his balance is bad (two falls as described below. He also has multiple "near misses." He is using a cane which he uses if he leaves the house. He is taking Gabapentin 300 mg TID and 800 mg at night with no reported side effects. Says he gets intermittent dizziness that is sperate from a side effect of the gabapentin.   Patient reports two falls in the last 90 days. In one, he felt dizzy and sat down and then leaned over and fell. He says he hit his head hard but did not lose consciousness. Reports headache and confusion that went away the next day. Notes that he thought he may have had a concussion but did not seek ED evaluation. Second fall was about 3 weeks ago, he was walking across the lawn and blacked out and hit the ground. Denies hitting his head for this  fall. Patient says he has CHF and has trouble breathing as we   PAIN:  Are you having pain? Yes: NPRS scale: 5\10 Pain location: back  Pain description: ache Aggravating factors: movement Relieving factors: rest  PRECAUTIONS: None  RED FLAGS: Chronic lumbar stenosis.     WEIGHT BEARING RESTRICTIONS: No  FALLS: Has patient fallen in last 6 months? No multiple near falls   LIVING ENVIRONMENT: Lives with: lives with their spouse Lives in: House/apartment Stairs: Yes:  Internal: 1 steps; planning to add grab bars and External: 1 steps; planning to add grab bars   Has following equipment at home: Single point cane, Walker - 2 wheeled, Wheelchair (manual), Grab bars, and has not use WC or RW for a while, and has access to power scooter if needed.    PLOF: Independent with household mobility without device, Independent with community mobility with device, and use of  SPC  PATIENT GOALS: improve balance and reduce reliance on SPC in the home   OBJECTIVE:  Note: Objective measures were completed at Evaluation unless otherwise noted.   COGNITION: Overall cognitive status: Within functional limits for tasks assessed   SENSATION: Light touch: Impaired  Baseline neuropathy. Just proximal to knee. No light touch in ankles or feet.   COORDINATION: Ankle to knee. Mild dysmetria and weakness on the LLE.  Decreased speed bil     POSTURE: rounded shoulders, forward head, and increased thoracic kyphosis  LOWER EXTREMITY ROM:     Mild decreased hip flexion ROM. All other WFL  LOWER EXTREMITY MMT:    MMT Right Eval Left Eval  Hip flexion 4 4-  Hip extension 4+ 4+  Hip abduction 4 4  Hip adduction 4+ 4+  Hip internal rotation    Hip external rotation    Knee flexion 4+ 4  Knee extension 5 4+  Ankle dorsiflexion 4+ 4+  Ankle plantarflexion    Ankle inversion    Ankle eversion    (Blank rows = not tested)  BED MOBILITY:  Sit to supine Modified independence Supine to sit Modified independence Rolling to Right Modified independence Rolling to Left Modified independence  TRANSFERS: Assistive device utilized: Single point cane and None  Sit to stand: Modified independence Stand to sit: Modified independence Chair to chair: Modified independence Floor:  not assessed   RAMP:  Level of Assistance: SBA Assistive device utilized: Single point cane Ramp Comments:   CURB:  Level of Assistance: Modified independence Assistive device utilized:   rail Curb Comments:   STAIRS: Level of Assistance: Modified independence Stair Negotiation Technique: Alternating Pattern  with Bilateral Rails Number of Stairs: 4  Height of Stairs: 6  Comments:   GAIT: Gait pattern: step through pattern and wide BOS Distance walked: 60 Assistive device utilized: Single point cane and None Level of assistance: SBA Comments: decreased step length   FUNCTIONAL TESTS:  5 times sit to stand: 22.84sec  Timed up and go (TUG): 16.72 sec  6 minute walk test: to be completed  10 meter walk test: 0.94m/s  Berg Balance Scale: 42 Functional gait assessment: 18  PATIENT SURVEYS:  ABC scale to be completed  TREATMENT DATE: 12/03/2023   Eval only   PATIENT EDUCATION: Education details: POC Person educated: Patient Education method: Explanation Education comprehension: verbalized understanding  HOME EXERCISE PROGRAM: To be given at visit 2   GOALS: Goals reviewed with patient? Yes  SHORT TERM GOALS: Target date: 12/31/2023    Patient will be independent in home exercise program to improve strength/mobility for better functional independence with ADLs. Baseline:to be given at visit 2  Goal status: INITIAL   LONG TERM GOALS: Target date: 02/25/2024    Patient will increase ABC score by 5points     to demonstrate statistically significant improvement in mobility and quality of life.  Baseline: to be given  Goal status: INITIAL  2.  Patient (> 66 years old) will complete five times sit to stand test in < 15 seconds indicating an increased LE strength and improved balance. Baseline: 22.84sec Goal status: INITIAL  3.  Patient will increase Berg Balance score by > 6 points to demonstrate decreased fall risk during functional activities Baseline: 42 Goal status: INITIAL  4.  Patient will increase 10 meter walk test to  >1.48m/s as to improve gait speed for better community ambulation and to reduce fall risk. Baseline: 0.70m/s Goal status: INITIAL  5.  Patient will reduce timed up and go to <13 seconds to reduce fall risk and demonstrate improved transfer/gait ability. Baseline: 16.72 Goal status: INITIAL  6.  Patient will increaseFGA score to 22 as to demonstrate reduced fall risk and improved dynamic gait balance for better safety with community/home ambulation.   Baseline: 18 Goal status: INITIAL   7.  Patient will increase 6 min walk test by 166ft reduced fall risk and improved dynamic gait balance for better safety with community/home ambulation.   Baseline: to be completed Goal status: INITIAL ASSESSMENT:  CLINICAL IMPRESSION: Patient is a 63 y.o. male who was seen today for physical therapy evaluation and treatment for balance impairments secondary to MG and neuropathy. Pt demonstrates increased fall risk with decreased Berg, increased time on 5xSTS, and TUG. Pt also demonstrates mild weakness in BLE as well as profound neuropathy in BLE increasing fall risk. Pt will benefit from skilled PT to address strength and balance deficits and allow improved overall QoL and increase independence.    OBJECTIVE IMPAIRMENTS: Abnormal gait, cardiopulmonary status limiting activity, decreased activity tolerance, decreased balance, decreased coordination, decreased endurance, decreased mobility, difficulty walking, and decreased strength.   ACTIVITY LIMITATIONS: carrying, lifting, standing, and locomotion level  PARTICIPATION LIMITATIONS: cleaning, laundry, shopping, community activity, and yard work  PERSONAL FACTORS: 1-2 comorbidities: neuropathy, MG  are also affecting patient's functional outcome.   REHAB POTENTIAL: Good  CLINICAL DECISION MAKING: Evolving/moderate complexity  EVALUATION COMPLEXITY: Moderate  PLAN:  PT FREQUENCY: 1-2x/week  PT DURATION: 12 weeks  PLANNED INTERVENTIONS:  97110-Therapeutic exercises, 97530- Therapeutic activity, 97112- Neuromuscular re-education, 97535- Self Care, [id number]- Manual therapy, [phone]- Gait training, Patient/Family education, Balance training, Stair training, DME instructions, Moist heat, and Biofeedback  PLAN FOR NEXT SESSION:   6 min walk test. Balance HEP    Golden Pop, PT 12/03/2023, 9:30 AM

## 2023-12-06 DIAGNOSIS — Z79899 Other long term (current) drug therapy: Secondary | ICD-10-CM

## 2023-12-06 MED ORDER — SPIRONOLACTONE 25 MG PO TABS: 12.5000 mg | ORAL_TABLET | Freq: Every day | ORAL | 3 refills | Status: AC

## 2023-12-08 ENCOUNTER — Ambulatory Visit: Payer: Medicare Other | Attending: Neurology | Admitting: Physical Therapy

## 2023-12-08 ENCOUNTER — Ambulatory Visit: Payer: Medicare Other | Attending: Anesthesiology | Admitting: Anesthesiology

## 2023-12-08 DIAGNOSIS — M5459 Other low back pain: Secondary | ICD-10-CM | POA: Diagnosis present

## 2023-12-08 DIAGNOSIS — G894 Chronic pain syndrome: Secondary | ICD-10-CM | POA: Diagnosis present

## 2023-12-08 DIAGNOSIS — A6923 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: Secondary | ICD-10-CM

## 2023-12-08 DIAGNOSIS — F1190 Opioid use, unspecified, uncomplicated: Secondary | ICD-10-CM | POA: Insufficient documentation

## 2023-12-08 DIAGNOSIS — M545 Low back pain, unspecified: Secondary | ICD-10-CM

## 2023-12-08 DIAGNOSIS — M4716 Other spondylosis with myelopathy, lumbar region: Secondary | ICD-10-CM | POA: Insufficient documentation

## 2023-12-08 DIAGNOSIS — M6281 Muscle weakness (generalized): Secondary | ICD-10-CM | POA: Insufficient documentation

## 2023-12-08 DIAGNOSIS — R29898 Other symptoms and signs involving the musculoskeletal system: Secondary | ICD-10-CM

## 2023-12-08 DIAGNOSIS — R269 Unspecified abnormalities of gait and mobility: Secondary | ICD-10-CM | POA: Diagnosis present

## 2023-12-08 DIAGNOSIS — M542 Cervicalgia: Secondary | ICD-10-CM

## 2023-12-08 DIAGNOSIS — R262 Difficulty in walking, not elsewhere classified: Secondary | ICD-10-CM | POA: Diagnosis present

## 2023-12-08 DIAGNOSIS — M5432 Sciatica, left side: Secondary | ICD-10-CM | POA: Diagnosis present

## 2023-12-08 DIAGNOSIS — M47817 Spondylosis without myelopathy or radiculopathy, lumbosacral region: Secondary | ICD-10-CM | POA: Diagnosis not present

## 2023-12-08 DIAGNOSIS — M5431 Sciatica, right side: Secondary | ICD-10-CM | POA: Diagnosis present

## 2023-12-08 DIAGNOSIS — M51362 Other intervertebral disc degeneration, lumbar region with discogenic back pain and lower extremity pain: Secondary | ICD-10-CM

## 2023-12-08 MED ORDER — OXYCODONE HCL 10 MG PO TABS: 10.0000 mg | ORAL_TABLET | Freq: Four times a day (QID) | ORAL | 0 refills | Status: AC

## 2023-12-08 MED ORDER — OXYCODONE HCL 10 MG PO TABS: 10.0000 mg | ORAL_TABLET | Freq: Four times a day (QID) | ORAL | 0 refills | Status: DC

## 2023-12-08 NOTE — Therapy (Signed)
 OUTPATIENT PHYSICAL THERAPY NEURO EVALUATION   Patient Name: [name] MRN: [id number] DOB:1961/05/19, 63 y.o., male Today's Date: 12/08/2023   PCP: [name], MD  REFERRING PROVIDER: Lonell Face, MD   END OF SESSION:  PT End of Session - 12/08/23 1127     Visit Number 2    Number of Visits 24    Date for PT Re-Evaluation 02/25/24    PT Start Time 1147    PT Stop Time 1227    PT Time Calculation (min) 40 min    Equipment Utilized During Treatment Gait belt    Activity Tolerance Patient limited by pain;Patient limited by fatigue    Behavior During Therapy [facility] for tasks assessed/performed             Past Medical History:  Diagnosis Date   Allergy    dust, seasonal (worse in the fall).   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 Followed by Pain Specialist in [place], back and neck   Asthma    BRONCHITIS   Cataract    First Dx in 2012   Chronic combined systolic and diastolic congestive heart failure (HCC)    a. 03/2018 Echo: EF 30-35%; b. 07/2018 Echo: EF 35-40%; c. 09/2019 TEE: EF 40-45%; d. 12/2020 Echo: EF 35-40%; e.01/2022  Echo: EF 45-50%. sev apical HK; f. 01/2023 Echo: EF 40-45%, mod asymm basal-septal LVH, apical AK, nl RV fxn.   CKD (chronic kidney disease), stage II    Coronary artery disease    a. Prior Ant MI->s/p mult stents->LAD/RCA ([place]); b. 2016 Cath: nonobs dzs;  c. 04/2018 Cath/PCI: LCX 71m (3.25x15 Sierra DES); d. 02/2022 MV: high risk; e. 02/2022 Cath: LM nl, LAD 20p ISR, patent mid-stent, LCX patent stent, OM1 nl, OM2 50p, patent stent, OM3 40p, patent stent, RCA 40p ISR, 49m ISR, 20m/d, patent distal stent, RPDA patent stent, RPAV small, 80 (sl progression)-->Med Rx.   Deaf, left    Diabetes mellitus without complication (HCC)    TYPE 2   Diabetic peripheral neuropathy (HCC)    feet and hands   FUO (fever of unknown origin) 08/03/2018   GERD (gastroesophageal reflux disease)    Headache    muscle tension   Hyperlipidemia    Hypertension     Hyperthyroidism    Insomnia    Ischemic cardiomyopathy    a. 03/2018 Echo: EF 30-35%; b. 07/2018 Echo: EF 35-40%, Gr1 DD; c. 09/2019 TEE: EF 40-45%; d. 12/2020 Echo: EF 35-40%. GrI DD; e. 01/2022 Echo: EF 45-50%; f. 01/2023 Echo: EF 40-45%.   Knee pain, acute 05/06/2020   Left arm weakness 10/04/2019   Left leg weakness 12/01/2019   Lyme disease    Chronic   Myasthenia gravis (HCC)    (03/18/21 - no current treatment - "better than it has ever been" per pt)   Myocardial infarction (HCC) 2010   Palpitations    a. 10/2020 Zio: RSR, 88 avg. 3 brief SVT episodes (max 5 beats @ 128). Rare PACs/PVCs. Triggered events did not correlate w/ significant arrthymia - some w/ sinus tach.   Seasonal allergies    Sepsis (HCC)    a.07/2018 - unknown source. TEE neg for veg 09/2019.   Sleep apnea    CPAP   Wears hearing aid in both ears    Past Surgical History:  Procedure Laterality Date   BILATERAL CARPAL TUNNEL RELEASE Bilateral L in 2012 and R in 2013   CARDIAC CATHETERIZATION  Several Caths, most recent in  March 2016.   COLONOSCOPY WITH PROPOFOL N/A 01/10/2016   Procedure: COLONOSCOPY WITH PROPOFOL;  Surgeon: Midge Minium, MD;  Location: [facility];  Service: Endoscopy;  Laterality: N/A;   COLONOSCOPY WITH PROPOFOL N/A 04/14/2021   Procedure: COLONOSCOPY WITH PROPOFOL;  Surgeon: Midge Minium, MD;  Location: [facility];  Service: Endoscopy;  Laterality: N/A;  Diabetic - insulin and oral meds   CORONARY ANGIOPLASTY     CORONARY STENT INTERVENTION N/A 04/25/2018   Procedure: CORONARY STENT INTERVENTION;  Surgeon: Iran Ouch, MD;  Location: [facility];  Service: Cardiovascular;  Laterality: N/A;   ESOPHAGOGASTRODUODENOSCOPY (EGD) WITH PROPOFOL N/A 01/10/2016   Procedure: ESOPHAGOGASTRODUODENOSCOPY (EGD) WITH PROPOFOL;  Surgeon: Midge Minium, MD;  Location: [facility];  Service: Endoscopy;  Laterality: N/A;   ESOPHAGOGASTRODUODENOSCOPY (EGD) WITH PROPOFOL N/A 04/14/2021    Procedure: ESOPHAGOGASTRODUODENOSCOPY (EGD) WITH BIOPSY;  Surgeon: Midge Minium, MD;  Location: [facility];  Service: Endoscopy;  Laterality: N/A;   EYE SURGERY Bilateral 2012   cataract/bilateral vitrectomies   GIVENS CAPSULE STUDY N/A 08/20/2021   Procedure: GIVENS CAPSULE STUDY;  Surgeon: Midge Minium, MD;  Location: [facility];  Service: Endoscopy;  Laterality: N/A;   LEFT HEART CATH AND CORONARY ANGIOGRAPHY Left 04/25/2018   Procedure: LEFT HEART CATH AND CORONARY ANGIOGRAPHY;  Surgeon: Iran Ouch, MD;  Location: [facility];  Service: Cardiovascular;  Laterality: Left;   LEFT HEART CATH AND CORONARY ANGIOGRAPHY Left 02/02/2022   Procedure: LEFT HEART CATH AND CORONARY ANGIOGRAPHY;  Surgeon: Iran Ouch, MD;  Location: [facility];  Service: Cardiovascular;  Laterality: Left;   TEE WITHOUT CARDIOVERSION N/A 09/05/2018   Procedure: TRANSESOPHAGEAL ECHOCARDIOGRAM (TEE);  Surgeon: Iran Ouch, MD;  Location: [facility];  Service: Cardiovascular;  Laterality: N/A;   TONSILLECTOMY AND ADENOIDECTOMY     As a child   TUNNELED VENOUS CATHETER PLACEMENT     removed   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) 12/02/2021   Gastropathy 08/13/2021   Iron deficiency anemia due to chronic blood loss    Pain due to onychomycosis of toenails of both feet 11/11/2020   Lung involvement associated with another disorder (HCC) 05/17/2020   Lower urinary tract symptoms 09/15/2019   Incomplete bladder emptying 09/15/2019   Carpal tunnel syndrome on both sides  07/12/2019   Idiopathic peripheral neuropathy 04/21/2019   Sensory ataxia 03/09/2019   OSA on CPAP 07/08/2018   Congestive heart failure (CHF) (HCC) 05/05/2018   Unstable angina (HCC) 04/26/2018   Ischemic cardiomyopathy 04/26/2018   Chronic combined systolic and diastolic CHF (congestive heart failure) (HCC) 04/26/2018   Anginal equivalent (HCC) 04/25/2018   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HCC) 01/25/2018   Vitamin D deficiency, unspecified 08/12/2017   Ptosis of left eyelid 01/08/2017   Dermatitis 12/24/2016   Benign neoplasm of sigmoid colon    Benign neoplasm of descending colon    Benign neoplasm of transverse colon    Coronary artery disease involving native coronary artery with angina pectoris (HCC) 10/22/2015   Coronary artery disease 08/22/2015   Myasthenia gravis (HCC) 08/22/2015  Chronic left-sided low back pain 08/22/2015   Hypertension 08/22/2015   GERD (gastroesophageal reflux disease) 08/22/2015   Hyperlipidemia 08/22/2015   Insomnia 08/22/2015   Type 2 diabetes mellitus with diabetic polyneuropathy, with long-term current use of insulin (HCC) 08/22/2015   Asthma 08/22/2015   Lyme disease 06/05/1992    ONSET DATE: 6months   REFERRING DIAG:  Diagnosis  R26.89 (ICD-10-CM) - Imbalance    THERAPY DIAG:  Other low back pain  Muscle weakness (generalized)  Gait difficulty  Difficulty in walking, not elsewhere classified  Rationale for Evaluation and Treatment: Rehabilitation  SUBJECTIVE:                                                                                                                                                                                             SUBJECTIVE STATEMENT:  Pt reports that he is doing well. Pain 6/10 at start of PT treatment in the low back and posterior R hip. Reports that he took pain meds just prior to PT. States that he has no falls, trips or stumbles   From EVAL:  Hx of mysathenia gravis.  States that he has had worsening balance over the last few months, pt utilities SPC with community mobility. States that he has begun using [facility] throughout house with multiple placed throughout house. Pt reports to PT with chronic low back pain currently 5/10. Dull ache. Pain increase to sharp pain with twisting.   Pt accompanied by: self  PERTINENT HISTORY:   From Neurology: Patient reports severe neuropathy in both his feet and his hands. Reports numbness, tingling, incoordination, electrical shocks, muscle mass, weakness. Does not a tremor in his hands (left > right). Tremor occurs when he first wakes up when he goes to a text someone. Denies burning, itching, coldness. He reports that when he tries to walk, he gets severe weakness in his triceps and in his thighs bilaterally. He says the weakness is what concerns him the most as he does not want to lose his strength. He reports that his balance is bad (two falls as described below. He also has multiple "near misses." He is using a cane which he uses if he leaves the house. He is taking Gabapentin 300 mg TID and 800 mg at night with no reported side effects. Says he gets intermittent dizziness that is sperate from a side effect of the gabapentin.   Patient reports two falls in the last 90 days. In one, he felt dizzy and sat down and then leaned over and fell. He says he hit his head hard but did not lose consciousness. Reports headache and confusion that  went away the next day. Notes that he thought he may have had a concussion but did not seek ED evaluation. Second fall was about 3 weeks ago, he was walking across the lawn and blacked out and hit the ground. Denies hitting his head for this fall. Patient says he has CHF and has trouble breathing as we   PAIN:  Are you having pain? Yes: NPRS scale: 5\10 Pain location: back  Pain description: ache Aggravating factors: movement Relieving factors: rest  PRECAUTIONS: None  RED FLAGS: Chronic  lumbar stenosis.     WEIGHT BEARING RESTRICTIONS: No  FALLS: Has patient fallen in last 6 months? No multiple near falls   LIVING ENVIRONMENT: Lives with: lives with their spouse Lives in: House/apartment Stairs: Yes: Internal: 1 steps; planning to add grab bars and External: 1 steps; planning to add grab bars   Has following equipment at home: Single point cane, Walker - 2 wheeled, Wheelchair (manual), Grab bars, and has not use WC or RW for a while, and has access to power scooter if needed.    PLOF: Independent with household mobility without device, Independent with community mobility with device, and use of  SPC  PATIENT GOALS: improve balance and reduce reliance on SPC in the home   OBJECTIVE:  Note: Objective measures were completed at Evaluation unless otherwise noted.   COGNITION: Overall cognitive status: Within functional limits for tasks assessed   SENSATION: Light touch: Impaired  Baseline neuropathy. Just proximal to knee. No light touch in ankles or feet.   COORDINATION: Ankle to knee. Mild dysmetria and weakness on the LLE.  Decreased speed bil     POSTURE: rounded shoulders, forward head, and increased thoracic kyphosis  LOWER EXTREMITY ROM:     Mild decreased hip flexion ROM. All other WFL  LOWER EXTREMITY MMT:    MMT Right Eval Left Eval  Hip flexion 4 4-  Hip extension 4+ 4+  Hip abduction 4 4  Hip adduction 4+ 4+  Hip internal rotation    Hip external rotation    Knee flexion 4+ 4  Knee extension 5 4+  Ankle dorsiflexion 4+ 4+  Ankle plantarflexion    Ankle inversion    Ankle eversion    (Blank rows = not tested)  BED MOBILITY:  Sit to supine Modified independence Supine to sit Modified independence Rolling to Right Modified independence Rolling to Left Modified independence  TRANSFERS: Assistive device utilized: Single point cane and None  Sit to stand: Modified independence Stand to sit: Modified independence Chair to chair:  Modified independence Floor:  not assessed   RAMP:  Level of Assistance: SBA Assistive device utilized: Single point cane Ramp Comments:   CURB:  Level of Assistance: Modified independence Assistive device utilized:  rail Curb Comments:   STAIRS: Level of Assistance: Modified independence Stair Negotiation Technique: Alternating Pattern  with Bilateral Rails Number of Stairs: 4  Height of Stairs: 6  Comments:   GAIT: Gait pattern: step through pattern and wide BOS Distance walked: 60 Assistive device utilized: Single point cane and None Level of assistance: SBA Comments: decreased step length   FUNCTIONAL TESTS:  5 times sit to stand: 22.84sec  Timed up and go (TUG): 16.72 sec  6 minute walk test: to be completed  10 meter walk test: 0.68m/s  Berg Balance Scale: 42 Functional gait assessment: 18  PATIENT SURVEYS:  ABC scale to be completed  TREATMENT DATE: 12/08/2023   6 Min Walk Test:  Instructed patient to ambulate as quickly and as safely as possible for 6 minutes using LRAD. Patient was allowed to take standing rest breaks without stopping the test, but if the patient required a sitting rest break the clock would be stopped and the test would be over.  Results: 1034 feet using no AD with CGA. Mild veer R and L intermittently throughout with staggering steps, but able to correct without additional assistance.  Results indicate that the patient has reduced endurance with ambulation compared to age matched norms.  Age Matched Norms: 11-69 yo M: 37 F: 40, 3-79 yo M: 13 F: 471, 72-89 yo M: 417 F: 392 MDC: 58.21 meters (190.98 feet) or 50 meters (ANPTA Core Set of Outcome Measures for Adults with Neurologic Conditions, 2018)  SLS with BUE support 3 x 10 sec bil  Tandem stance 2 x 15 sec Side stepping at rail 54ft x 5 bil  Forward/reverse gait  with LUE support on rail 36ft x 5  Narrow stance without UE support 3 x 20sec   Throughout session PT provided CGA with gait belt in place to reduce fall risk and improve pt safety.   Reports mild discomfort with SLS on the LLE.   PATIENT EDUCATION: Education details: POC, HEP.  Person educated: Patient Education method: Explanation Education comprehension: verbalized understanding  HOME EXERCISE PROGRAM: Access Code: [email or web address] URL: [email or web address] Date: 12/08/2023 Prepared by: Grier Rocher  Exercises - Standing Single Leg Stance with Counter Support  - 1 x daily - 3-4 x weekly - 3 sets - 4 reps - 10 sec hold - Standing Tandem Balance with Counter Support  - 1 x daily - 3-4 x weekly - 3 sets - 4 reps - 15sec hold - Side Stepping with Counter Support  - 1 x daily - 3-4 x weekly - 3 sets - 5 reps - Backward Walking with Counter Support  - 1 x daily - 3-4 x weekly - 3 sets - 5 reps - Narrow Stance with Counter Support  - 1 x daily - 3-4 x weekly - 3 sets - 4 reps - 15 hold  GOALS: Goals reviewed with patient? Yes  SHORT TERM GOALS: Target date: 12/31/2023    Patient will be independent in home exercise program to improve strength/mobility for better functional independence with ADLs. Baseline:to be given at visit 2  Goal status: INITIAL   LONG TERM GOALS: Target date: 02/25/2024    Patient will increase ABC score by 5points     to demonstrate statistically significant improvement in mobility and quality of life.  Baseline: to be given  Goal status: INITIAL  2.  Patient (> 17 years old) will complete five times sit to stand test in < 15 seconds indicating an increased LE strength and improved balance. Baseline: 22.84sec Goal status: INITIAL  3.  Patient will increase Berg Balance score by > 6 points to demonstrate decreased fall risk during functional activities Baseline: 42 Goal status: INITIAL  4.  Patient will increase 10 meter walk test to >1.74m/s  as to improve gait speed for better community ambulation and to reduce fall risk. Baseline: 0.46m/s Goal status: INITIAL  5.  Patient will reduce timed up and go to <13 seconds to reduce fall risk and demonstrate improved transfer/gait ability. Baseline: 16.72 Goal status: INITIAL  6.  Patient will increaseFGA score to 22 as to demonstrate reduced fall risk and improved dynamic gait balance for better safety with  community/home ambulation.   Baseline: 18 Goal status: INITIAL   7.  Patient will increase 6 min walk test by 178ft reduced fall risk and improved dynamic gait balance for better safety with community/home ambulation.   Baseline: 1032ft no AD  Goal status: INITIAL ASSESSMENT:  CLINICAL IMPRESSION: Patient is a 63 y.o. male who was seen today for physical therapy treatment for balance impairments secondary to MG and neuropathy. Pt demonstrates decreased community access and safety with 6 min walk test of only 1071ft. PT provided balance HEP with hand out provided, limited but mild pain in the LLE for SLS tasks, but able to perform safely with intermittent UE for all exercises. Pt will benefit from skilled PT to address strength and balance deficits and allow improved overall QoL and increase independence.    OBJECTIVE IMPAIRMENTS: Abnormal gait, cardiopulmonary status limiting activity, decreased activity tolerance, decreased balance, decreased coordination, decreased endurance, decreased mobility, difficulty walking, and decreased strength.   ACTIVITY LIMITATIONS: carrying, lifting, standing, and locomotion level  PARTICIPATION LIMITATIONS: cleaning, laundry, shopping, community activity, and yard work  PERSONAL FACTORS: 1-2 comorbidities: neuropathy, MG  are also affecting patient's functional outcome.   REHAB POTENTIAL: Good  CLINICAL DECISION MAKING: Evolving/moderate complexity  EVALUATION COMPLEXITY: Moderate  PLAN:  PT FREQUENCY: 1-2x/week  PT DURATION: 12  weeks  PLANNED INTERVENTIONS: 97110-Therapeutic exercises, 97530- Therapeutic activity, [id number]- Neuromuscular re-education, 97535- Self Care, [id number]- Manual therapy, [phone]- Gait training, Patient/Family education, Balance training, Stair training, DME instructions, Moist heat, and Biofeedback  PLAN FOR NEXT SESSION:   Review balance HEP.  Dynamic balance training.    Golden Pop, PT 12/08/2023, 11:28 AM

## 2023-12-08 NOTE — Progress Notes (Signed)
 Virtual Visit via Telephone Note  I connected with [name] on 12/08/23 at  4:00 PM EST by telephone and verified that I am speaking with the correct person using two identifiers.  Location: Patient: Home Provider: [facility]   I discussed the limitations, risks, security and privacy concerns of performing an evaluation and management service by telephone and the availability of in person appointments. I also discussed with the patient that there may be a patient responsible charge related to this service. The patient expressed understanding and agreed to proceed.   History of Present Illness: I spoke with [name] via telephone as we were unable link for the video portion of the conference.  He reports that he is getting a considerable amount of right side sciatica.  Following his December epidural the left side sciatica has remitted.  He is having very little pain on the left lower leg some back pain generally controlled with the oxycodone 10 mg tablets 4 times a day.  He has been on this chronically and this continues to work well for him.  However it is not managing the right side sciatica sufficiently and he desires to proceed with a repeat epidural here within the next week which is already scheduled.  Otherwise he is in need of refill for his medication.  The quality characteristic and distribution of the pain is stable with no recent change.  He has no side effects with the medication either.  No change in lower extremity strength is noted.  Review of systems: General: No fevers or chills Pulmonary: No shortness of breath or dyspnea Cardiac: No angina or palpitations or lightheadedness GI: No abdominal pain or constipation Psych: No depression    Observations/Objective:  Current Outpatient Medications:    [START ON 01/23/2024] Oxycodone HCl 10 MG TABS, Take 1 tablet (10 mg total) by mouth in the morning, at noon, in the evening, and at bedtime., Disp: 120 tablet,  Rfl: 0   albuterol (PROVENTIL) (2.5 MG/3ML) 0.083% nebulizer solution, Take 3 mLs (2.5 mg total) by nebulization every 6 (six) hours as needed for wheezing or shortness of breath., Disp: 75 mL, Rfl: 2   albuterol (VENTOLIN HFA) 108 (90 Base) MCG/ACT inhaler, Inhale 2 puffs into the lungs daily., Disp: , Rfl:    ammonium lactate (AMLACTIN) 12 % lotion, Apply 1 Application topically as needed for dry skin., Disp: 400 g, Rfl: 3   aspirin EC 81 MG tablet, Take 1 tablet (81 mg total) by mouth daily., Disp: 90 tablet, Rfl: 3   BD PEN NEEDLE NANO 2ND GEN 32G X 4 MM MISC, USE AS DIRECTED 5 TO 6 TIMES DAILY DEPENDING ON MEALS AND BLOOD SUGAR, Disp: , Rfl:    clopidogrel (PLAVIX) 75 MG tablet, TAKE 1 TABLET BY MOUTH DAILY WITH BREAKFAST, Disp: 90 tablet, Rfl: 0   cyclobenzaprine (FLEXERIL) 10 MG tablet, Take 1 tablet (10 mg total) by mouth 2 (two) times daily. TAKE 1 TABLET(10 MG) BY MOUTH TWICE DAILY AS NEEDED, Disp: 60 tablet, Rfl: 3   dexlansoprazole (DEXILANT) 60 MG capsule, Take 1 capsule (60 mg total) by mouth daily., Disp: 30 capsule, Rfl: 2   DULoxetine (CYMBALTA) 30 MG capsule, Take 1 capsule (30 mg total) by mouth daily., Disp: 90 capsule, Rfl: 1   DULoxetine (CYMBALTA) 60 MG capsule, Take 1 capsule (60 mg total) by mouth daily., Disp: 90 capsule, Rfl: 1   empagliflozin (JARDIANCE) 10 MG TABS tablet, TAKE 1 TABLET(10 MG) BY MOUTH DAILY BEFORE BREAKFAST,  Disp: 90 tablet, Rfl: 3   ezetimibe (ZETIA) 10 MG tablet, TAKE 1 TABLET(10 MG) BY MOUTH DAILY, Disp: 90 tablet, Rfl: 0   fluticasone furoate-vilanterol (BREO ELLIPTA) 200-25 MCG/ACT AEPB, Inhale 1 puff into the lungs daily., Disp: , Rfl:    furosemide (LASIX) 40 MG tablet, Take 1 tablet (40 mg total) by mouth daily., Disp: 90 tablet, Rfl: 3   gabapentin (NEURONTIN) 300 MG capsule, Take 1 capsule (300 mg total) by mouth 3 (three) times daily., Disp: 90 capsule, Rfl: 1   gabapentin (NEURONTIN) 800 MG tablet, Take 800 mg by mouth at bedtime., Disp: ,  Rfl:    insulin lispro (HUMALOG) 100 UNIT/ML KiwkPen, Inject 6-10 Units into the skin 3 (three) times daily.  sliding scale 1 unit for every 10 units of carbs, Disp: , Rfl:    ketoconazole (NIZORAL) 2 % cream, Apply 1 Application topically daily., Disp: 60 g, Rfl: 0   LANTUS SOLOSTAR 100 UNIT/ML Solostar Pen, Inject 20-23 Units into the skin daily at 10 pm. Sliding scale based on blood glucose, Disp: , Rfl: 0   levocetirizine (XYZAL) 5 MG tablet, Take 1 tablet (5 mg total) by mouth daily at 12 noon., Disp: 90 tablet, Rfl: 1   levothyroxine (SYNTHROID) 50 MCG tablet, TAKE 1 TABLET BY MOUTH EVERY DAY ON MONDAY THROUGH SATURDAY, 2 TABLETS DAILY ON SUNDAY, Disp: 102 tablet, Rfl: 0   lubiprostone (AMITIZA) 8 MCG capsule, Take 1 capsule (8 mcg total) by mouth 2 (two) times daily with a meal., Disp: 180 capsule, Rfl: 1   metFORMIN (GLUCOPHAGE) 1000 MG tablet, Take 1,000 mg by mouth 2 (two) times daily with a meal., Disp: , Rfl:    montelukast (SINGULAIR) 10 MG tablet, Take 10 mg by mouth at bedtime., Disp: , Rfl:    Multiple Vitamins-Minerals (MENS MULTI VITAMIN & MINERAL PO), Take 1 tablet by mouth in the morning and at bedtime., Disp: , Rfl:    naloxone (NARCAN) nasal spray 4 mg/0.1 mL, For excess sedation from opioids, Disp: 1 kit, Rfl: 2   nitroGLYCERIN (NITROSTAT) 0.3 MG SL tablet, DISSOLVE 1 TABLET UNDER THE TONGUE EVERY 5 MINUTES AS NEEDED FOR CHEST PAIN, MAX 3 DOSES, Disp: 25 tablet, Rfl: 0   [START ON 12/24/2023] Oxycodone HCl 10 MG TABS, Take 1 tablet (10 mg total) by mouth in the morning, at noon, in the evening, and at bedtime., Disp: 120 tablet, Rfl: 0   rosuvastatin (CRESTOR) 5 MG tablet, Take 5 mg by mouth daily with supper., Disp: , Rfl: 0   Semaglutide, 2 MG/DOSE, 8 MG/3ML SOPN, Inject into the skin., Disp: , Rfl:    spironolactone (ALDACTONE) 25 MG tablet, Take 0.5 tablets (12.5 mg total) by mouth daily., Disp: 45 tablet, Rfl: 3  Current Facility-Administered Medications:    sodium  chloride flush (NS) 0.9 % injection 3 mL, 3 mL, Intravenous, Q12H, Furth, Cadence H, PA-C   Past Medical History:  Diagnosis Date   Allergy    dust, seasonal (worse in the fall).   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 Followed by Pain Specialist in [place], back and neck   Asthma    BRONCHITIS   Cataract    First Dx in 2012   Chronic combined systolic and diastolic congestive heart failure (HCC)    a. 03/2018 Echo: EF 30-35%; b. 07/2018 Echo: EF 35-40%; c. 09/2019 TEE: EF 40-45%; d. 12/2020 Echo: EF 35-40%; e.01/2022  Echo: EF 45-50%. sev apical HK; f. 01/2023 Echo: EF  40-45%, mod asymm basal-septal LVH, apical AK, nl RV fxn.   CKD (chronic kidney disease), stage II    Coronary artery disease    a. Prior Ant MI->s/p mult stents->LAD/RCA ([place]); b. 2016 Cath: nonobs dzs;  c. 04/2018 Cath/PCI: LCX 26m (3.25x15 Sierra DES); d. 02/2022 MV: high risk; e. 02/2022 Cath: LM nl, LAD 20p ISR, patent mid-stent, LCX patent stent, OM1 nl, OM2 50p, patent stent, OM3 40p, patent stent, RCA 40p ISR, 69m ISR, 47m/d, patent distal stent, RPDA patent stent, RPAV small, 80 (sl progression)-->Med Rx.   Deaf, left    Diabetes mellitus without complication (HCC)    TYPE 2   Diabetic peripheral neuropathy (HCC)    feet and hands   FUO (fever of unknown origin) 08/03/2018   GERD (gastroesophageal reflux disease)    Headache    muscle tension   Hyperlipidemia    Hypertension    Hyperthyroidism    Insomnia    Ischemic cardiomyopathy    a. 03/2018 Echo: EF 30-35%; b. 07/2018 Echo: EF 35-40%, Gr1 DD; c. 09/2019 TEE: EF 40-45%; d. 12/2020 Echo: EF 35-40%. GrI DD; e. 01/2022 Echo: EF 45-50%; f. 01/2023 Echo: EF 40-45%.   Knee pain, acute 05/06/2020   Left arm weakness 10/04/2019   Left leg weakness 12/01/2019   Lyme disease    Chronic   Myasthenia gravis (HCC)    (03/18/21 - no current treatment - "better than it has ever been" per pt)   Myocardial infarction (HCC) 2010   Palpitations    a. 10/2020 [facility]: RSR, 88 avg. 3  brief SVT episodes (max 5 beats @ 128). Rare PACs/PVCs. Triggered events did not correlate w/ significant arrthymia - some w/ sinus tach.   Seasonal allergies    Sepsis (HCC)    a.07/2018 - unknown source. TEE neg for veg 09/2019.   Sleep apnea    CPAP   Wears hearing aid in both ears    Assessment and Plan:  1. Lumbar spondylosis with myelopathy   2. Facet arthritis of lumbosacral region    1. Lumbar spondylosis with myelopathy   2. Facet arthritis of lumbosacral region   3. Chronic pain syndrome   4. Left leg weakness   5. Sciatica, left side   6. Chronic, continuous use of opioids   7. Low back pain at multiple sites   8. Lyme arthritis of multiple joints (HCC)   9. Cervicalgia   10. Degeneration of intervertebral disc of lumbar region with discogenic back pain and lower extremity pain   11. Left arm weakness     Based on our conversation it is appropriate to refill his medicines for the next 2 months.  I have reviewed the [facility] practitioner database information is appropriate.  Refills were generated for March 21 and April 20.  He has Flexeril that he is currently utilizing.  Continue stretching and ice application.  He can take low-dose anti-inflammatories at night to help with pain relief over night and he is to stop his Plavix in advance of the epidural.  Continue follow-up with his primary care physicians for baseline medical care. Follow Up Instructions:    I discussed the assessment and treatment plan with the patient. The patient was provided an opportunity to ask questions and all were answered. The patient agreed with the plan and demonstrated an understanding of the instructions.   The patient was advised to call back or seek an in-person evaluation if the symptoms worsen or if the condition fails  to improve as anticipated.  I provided 30 minutes of non-face-to-face time during this encounter.   Yevette Edwards, MD

## 2023-12-10 ENCOUNTER — Ambulatory Visit: Payer: Medicare Other | Admitting: Physical Therapy

## 2023-12-13 ENCOUNTER — Ambulatory Visit: Payer: Medicare Other | Admitting: Anesthesiology

## 2023-12-14 ENCOUNTER — Ambulatory Visit: Payer: Medicare Other | Admitting: Licensed Clinical Social Worker

## 2023-12-14 DIAGNOSIS — F331 Major depressive disorder, recurrent, moderate: Secondary | ICD-10-CM | POA: Diagnosis not present

## 2023-12-14 DIAGNOSIS — F439 Reaction to severe stress, unspecified: Secondary | ICD-10-CM | POA: Diagnosis not present

## 2023-12-14 NOTE — Progress Notes (Signed)
 THERAPIST PROGRESS NOTE  Session Time: 11:05am-11:37am  Participation Level: Active  Behavioral Response: CasualAlertEuthymic  Type of Therapy: Individual Therapy  Treatment Goals addressed:  Goal: STG: Adhvik "[name]" will identify situations, thoughts, and feelings that trigger internal anger, and/or angry/aggressive actions as evidenced by self-report                                        Goal: LTG: Pt reports he would like to "control anger issues"                                                                                                                                                                                                                Goal: STG: [name] will learn 2 coping to practice daily to decrease the frequency and severity of anxious symptoms.                                            Goal: Level of anxiety will decrease                       ProgressTowards Goals: Progressing  Interventions: CBT, Supportive, Reframing, and Other: EMDR  Summary: [name] is a 63 y.o. male who presents with symptoms of anxiety. Patient reports symptoms to include uncontrollable worry, anxious feelings, avoidance, tension, restlessness. Pt was oriented times 5. Pt was cooperative and engaged. Pt denies SI/HI/AVH.   Patient utilized therapeutic space to process stress and anxiety related to continual legal proceedings.  Worked with clinician to identify ways in which she is practicing self-care during this stressful time.  Patient began working through his target sequence plan processing his [employer] memory.  Patient was able to decrease subjective units of distress from a 2 to a 0 challenging the negative cognition "I am powerless."  Worked with clinician to further instill the positive adaptive belief "I am in control."  Patient was also able to utilize EMDR to process feelings of powerlessness related to his legal proceedings and shifting  perspective to controllable versus uncontrollable factors.  Suicidal/Homicidal: Nowithout intent/plan  Therapist Response: Cln utilized active and supportive reflection to create safe environment for patient to process recent life stressors. Clinician assessed for current symptoms, stressors, safety since last session.  Clinician utilized EMDR to began to process  patient's target sequence plan.   Plan: Return again in 2 weeks.  Diagnosis: MDD (major depressive disorder), recurrent episode, moderate (HCC)  Trauma and stressor-related disorder   Collaboration of Care: "[facility] psychiatrist can access notes and cln. Will review psychiatrists' notes. Check in with the patient and will see LCSW per availability. Patient agreed with treatment recommendations.   Patient/Guardian was advised Release of Information must be obtained prior to any record release in order to collaborate their care with an outside provider. Patient/Guardian was advised if they have not already done so to contact the registration department to sign all necessary forms in order for [place] to release information regarding their care.   Consent: Patient/Guardian gives verbal consent for treatment and assignment of benefits for services provided during this visit. Patient/Guardian expressed understanding and agreed to proceed.   Dereck Leep, LCSW 12/14/2023

## 2023-12-15 ENCOUNTER — Ambulatory Visit: Payer: Medicare Other | Admitting: Physical Therapy

## 2023-12-16 ENCOUNTER — Ambulatory Visit: Payer: Medicare Other | Admitting: Medical

## 2023-12-17 ENCOUNTER — Ambulatory Visit: Payer: Medicare Other | Admitting: Physical Therapy

## 2023-12-22 ENCOUNTER — Other Ambulatory Visit: Payer: Self-pay | Admitting: Anesthesiology

## 2023-12-22 ENCOUNTER — Ambulatory Visit (HOSPITAL_BASED_OUTPATIENT_CLINIC_OR_DEPARTMENT_OTHER): Admitting: Anesthesiology

## 2023-12-22 ENCOUNTER — Telehealth: Payer: Self-pay | Admitting: Physical Therapy

## 2023-12-22 ENCOUNTER — Ambulatory Visit: Payer: Medicare Other | Admitting: Physical Therapy

## 2023-12-22 ENCOUNTER — Ambulatory Visit
Admission: RE | Admit: 2023-12-22 | Discharge: 2023-12-22 | Disposition: A | Discharge status: Home / Self Care | Source: Ambulatory Visit | Attending: Anesthesiology | Admitting: Anesthesiology

## 2023-12-22 ENCOUNTER — Encounter: Payer: Self-pay | Admitting: Anesthesiology

## 2023-12-22 VITALS — BP 128/95 | HR 94 | Temp 97.6°F | Resp 18 | Ht 67.0 in | Wt 205.0 lb

## 2023-12-22 DIAGNOSIS — M5431 Sciatica, right side: Secondary | ICD-10-CM

## 2023-12-22 DIAGNOSIS — M5459 Other low back pain: Secondary | ICD-10-CM | POA: Diagnosis not present

## 2023-12-22 DIAGNOSIS — M5432 Sciatica, left side: Secondary | ICD-10-CM | POA: Insufficient documentation

## 2023-12-22 DIAGNOSIS — G894 Chronic pain syndrome: Secondary | ICD-10-CM

## 2023-12-22 DIAGNOSIS — F1190 Opioid use, unspecified, uncomplicated: Secondary | ICD-10-CM | POA: Insufficient documentation

## 2023-12-22 DIAGNOSIS — M47817 Spondylosis without myelopathy or radiculopathy, lumbosacral region: Secondary | ICD-10-CM

## 2023-12-22 DIAGNOSIS — M4716 Other spondylosis with myelopathy, lumbar region: Secondary | ICD-10-CM | POA: Insufficient documentation

## 2023-12-22 MED ORDER — TRIAMCINOLONE ACETONIDE 40 MG/ML IJ SUSP
40.0000 mg | Freq: Once | INTRAMUSCULAR | Status: AC
  Administered 2023-12-22: 40 mg
  Filled 2023-12-22: qty 1

## 2023-12-22 MED ORDER — LIDOCAINE HCL (PF) 1 % IJ SOLN
5.0000 mL | Freq: Once | INTRAMUSCULAR | Status: AC
  Administered 2023-12-22: 5 mL via SUBCUTANEOUS
  Filled 2023-12-22: qty 5

## 2023-12-22 MED ORDER — SODIUM CHLORIDE 0.9% FLUSH
10.0000 mL | Freq: Once | INTRAVENOUS | Status: AC
  Administered 2023-12-22: 10 mL

## 2023-12-22 MED ORDER — ROPIVACAINE HCL 2 MG/ML IJ SOLN
10.0000 mL | Freq: Once | INTRAMUSCULAR | Status: AC
  Administered 2023-12-22: 10 mL via EPIDURAL
  Filled 2023-12-22: qty 20

## 2023-12-22 MED ORDER — IOHEXOL 180 MG/ML  SOLN
10.0000 mL | Freq: Once | INTRAMUSCULAR | Status: AC | PRN
  Administered 2023-12-22: 10 mL via EPIDURAL
  Filled 2023-12-22: qty 20

## 2023-12-22 NOTE — Progress Notes (Signed)
 Safety precautions to be maintained throughout the outpatient stay will include: orient to surroundings, keep bed in low position, maintain call bell within reach at all times, provide assistance with transfer out of bed and ambulation.

## 2023-12-22 NOTE — Progress Notes (Signed)
 Subjective:  Patient ID: [name], male    DOB: 1960/10/25  Age: 63 y.o. MRN: [id number]  CC: Back Pain   Procedure: L5-S1 epidural steroid under fluoroscopic guidance with no sedation  HPI [name] presents for reevaluation.  [name] has responded favorably to epidurals in the past but is having a lot of right sided sciatica.  He has some baseline left-sided sciatica and has generally experienced nearly complete relief of his sciatica symptoms following epidural with his most recent injection back in December.  He had left side sciatica at that time but about a month and a half after the injection he was beginning to experience some right sided sciatica which has been more problematic for him.  It keeps him from sleeping at night and limits his activity.  He presents today requesting a epidural steroid injection to assist with the right greater than left side sciatica.  No change in lower extremity strength function bowel or bladder function is noted.  He continues to take his opioid medications effectively and these continue to give him good relief.  No side effects are noted.  Bowel bladder functions been stable additionally.  Outpatient Medications Prior to Visit  Medication Sig Dispense Refill   albuterol (PROVENTIL) (2.5 MG/3ML) 0.083% nebulizer solution Take 3 mLs (2.5 mg total) by nebulization every 6 (six) hours as needed for wheezing or shortness of breath. 75 mL 2   albuterol (VENTOLIN HFA) 108 (90 Base) MCG/ACT inhaler Inhale 2 puffs into the lungs daily.     ammonium lactate (AMLACTIN) 12 % lotion Apply 1 Application topically as needed for dry skin. 400 g 3   aspirin EC 81 MG tablet Take 1 tablet (81 mg total) by mouth daily. 90 tablet 3   BD PEN NEEDLE NANO 2ND GEN 32G X 4 MM MISC USE AS DIRECTED 5 TO 6 TIMES DAILY DEPENDING ON MEALS AND BLOOD SUGAR     clopidogrel (PLAVIX) 75 MG tablet TAKE 1 TABLET BY MOUTH DAILY WITH BREAKFAST 90 tablet 0   cyclobenzaprine  (FLEXERIL) 10 MG tablet Take 1 tablet (10 mg total) by mouth 2 (two) times daily. TAKE 1 TABLET(10 MG) BY MOUTH TWICE DAILY AS NEEDED 60 tablet 3   dexlansoprazole (DEXILANT) 60 MG capsule Take 1 capsule (60 mg total) by mouth daily. 30 capsule 2   DULoxetine (CYMBALTA) 30 MG capsule Take 1 capsule (30 mg total) by mouth daily. 90 capsule 1   DULoxetine (CYMBALTA) 60 MG capsule Take 1 capsule (60 mg total) by mouth daily. 90 capsule 1   empagliflozin (JARDIANCE) 10 MG TABS tablet TAKE 1 TABLET(10 MG) BY MOUTH DAILY BEFORE BREAKFAST 90 tablet 3   ezetimibe (ZETIA) 10 MG tablet TAKE 1 TABLET(10 MG) BY MOUTH DAILY 90 tablet 0   fluticasone furoate-vilanterol (BREO ELLIPTA) 200-25 MCG/ACT AEPB Inhale 1 puff into the lungs daily.     furosemide (LASIX) 40 MG tablet Take 1 tablet (40 mg total) by mouth daily. 90 tablet 3   gabapentin (NEURONTIN) 300 MG capsule Take 1 capsule (300 mg total) by mouth 3 (three) times daily. 90 capsule 1   gabapentin (NEURONTIN) 800 MG tablet Take 800 mg by mouth at bedtime.     insulin lispro (HUMALOG) 100 UNIT/ML KiwkPen Inject 6-10 Units into the skin 3 (three) times daily.  sliding scale 1 unit for every 10 units of carbs     ketoconazole (NIZORAL) 2 % cream Apply 1 Application topically daily. 60 g 0   LANTUS  SOLOSTAR 100 UNIT/ML Solostar Pen Inject 20-23 Units into the skin daily at 10 pm. Sliding scale based on blood glucose  0   levocetirizine (XYZAL) 5 MG tablet Take 1 tablet (5 mg total) by mouth daily at 12 noon. 90 tablet 1   levothyroxine (SYNTHROID) 50 MCG tablet TAKE 1 TABLET BY MOUTH EVERY DAY ON MONDAY THROUGH SATURDAY, 2 TABLETS DAILY ON SUNDAY 102 tablet 0   lubiprostone (AMITIZA) 8 MCG capsule Take 1 capsule (8 mcg total) by mouth 2 (two) times daily with a meal. 180 capsule 1   metFORMIN (GLUCOPHAGE) 1000 MG tablet Take 1,000 mg by mouth 2 (two) times daily with a meal.     montelukast (SINGULAIR) 10 MG tablet Take 10 mg by mouth at bedtime.     Multiple  Vitamins-Minerals (MENS MULTI VITAMIN & MINERAL PO) Take 1 tablet by mouth in the morning and at bedtime.     naloxone (NARCAN) nasal spray 4 mg/0.1 mL For excess sedation from opioids 1 kit 2   nitroGLYCERIN (NITROSTAT) 0.3 MG SL tablet DISSOLVE 1 TABLET UNDER THE TONGUE EVERY 5 MINUTES AS NEEDED FOR CHEST PAIN, MAX 3 DOSES 25 tablet 0   [START ON 12/24/2023] Oxycodone HCl 10 MG TABS Take 1 tablet (10 mg total) by mouth in the morning, at noon, in the evening, and at bedtime. 120 tablet 0   [START ON 01/23/2024] Oxycodone HCl 10 MG TABS Take 1 tablet (10 mg total) by mouth in the morning, at noon, in the evening, and at bedtime. 120 tablet 0   rosuvastatin (CRESTOR) 5 MG tablet Take 5 mg by mouth daily with supper.  0   Semaglutide, 2 MG/DOSE, 8 MG/3ML SOPN Inject into the skin.     spironolactone (ALDACTONE) 25 MG tablet Take 0.5 tablets (12.5 mg total) by mouth daily. 45 tablet 3   Facility-Administered Medications Prior to Visit  Medication Dose Route Frequency Provider Last Rate Last Admin   sodium chloride flush (NS) 0.9 % injection 3 mL  3 mL Intravenous Q12H Furth, Cadence H, PA-C        Review of Systems CNS: No confusion or sedation Cardiac: No angina or palpitations GI: No abdominal pain or constipation Constitutional: No nausea vomiting fevers or chills  Objective:  BP (!) 128/95   Pulse 94   Temp 97.6 F (36.4 C) (Temporal)   Resp 18   Ht 5\' 7"  (1.702 m)   Wt 205 lb (93 kg)   SpO2 97%   BMI 32.11 kg/m    BP Readings from Last 3 Encounters:  12/22/23 (!) 128/95  12/02/23 106/64  09/21/23 (!) 109/91     Wt Readings from Last 3 Encounters:  12/22/23 205 lb (93 kg)  12/02/23 214 lb 6.4 oz (97.3 kg)  09/21/23 205 lb (93 kg)     Physical Exam Pt is alert and oriented PERRL EOMI HEART IS RRR no murmur or rub LCTA no wheezing or rales MUSCULOSKELETAL reveals some paraspinous muscle tenderness but no overt trigger points.  He does have a positive straight leg  raise on the right side negative on the left.  Muscle tone and bulk is at baseline.  Has an antalgic gait.  Labs  Lab Results  Component Value Date   HGBA1C 7.5 10/21/2022   HGBA1C 7.7 06/24/2022   HGBA1C 7.8 03/24/2022   Lab Results  Component Value Date   MICROALBUR 0.2 02/16/2023   LDLCALC 46 12/02/2023   CREATININE 1.60 (H) 12/02/2023    --------------------------------------------------------------------------------------------------------------------  Lab Results  Component Value Date   WBC 5.2 12/02/2023   HGB 14.0 12/02/2023   HCT 42.5 12/02/2023   PLT 231 12/02/2023   GLUCOSE 192 (H) 12/02/2023   CHOL 113 12/02/2023   TRIG 140 12/02/2023   HDL 45 12/02/2023   LDLDIRECT 36 07/17/2021   LDLCALC 46 12/02/2023   ALT 21 12/02/2023   AST 21 12/02/2023   NA 142 12/02/2023   K 4.6 12/02/2023   CL 99 12/02/2023   CREATININE 1.60 (H) 12/02/2023   BUN 24 12/02/2023   CO2 35 (H) 12/02/2023   TSH 3.40 12/02/2023   PSA 0.56 09/23/2021   INR 1.26 07/07/2018   HGBA1C 7.5 10/21/2022   MICROALBUR 0.2 02/16/2023    --------------------------------------------------------------------------------------------------------------------- DG PAIN CLINIC C-ARM 1-60 MIN NO REPORT Result Date: 12/22/2023 Fluoro was used, but no Radiologist interpretation will be provided. Please refer to "NOTES" tab for provider progress note.    Assessment & Plan:   Saliou "Jorja Loa" was seen today for back pain.  Diagnoses and all orders for this visit:  Chronic, continuous use of opioids  Chronic pain syndrome  Facet arthritis of lumbosacral region  Lumbar spondylosis with myelopathy  Sciatica, right side  Sciatica, left side  Other orders -     triamcinolone acetonide (KENALOG-40) injection 40 mg -     sodium chloride flush (NS) 0.9 % injection 10 mL -     ropivacaine (PF) 2 mg/mL (0.2%) (NAROPIN) injection 10 mL -     lidocaine (PF) (XYLOCAINE) 1 % injection 5 mL -     iohexol  (OMNIPAQUE) 180 MG/ML injection 10 mL        ----------------------------------------------------------------------------------------------------------------------  Problem List Items Addressed This Visit   None Visit Diagnoses       Chronic, continuous use of opioids    -  Primary     Chronic pain syndrome       Relevant Medications   triamcinolone acetonide (KENALOG-40) injection 40 mg (Completed)   ropivacaine (PF) 2 mg/mL (0.2%) (NAROPIN) injection 10 mL (Completed)   lidocaine (PF) (XYLOCAINE) 1 % injection 5 mL (Completed)     Facet arthritis of lumbosacral region       Relevant Medications   triamcinolone acetonide (KENALOG-40) injection 40 mg (Completed)     Lumbar spondylosis with myelopathy       Relevant Medications   triamcinolone acetonide (KENALOG-40) injection 40 mg (Completed)     Sciatica, right side         Sciatica, left side             ----------------------------------------------------------------------------------------------------------------------  1. Chronic, continuous use of opioids (Primary) I have reviewed the [facility] practitioner database and feel it appropriate to continue his current medication regimen.  2. Chronic pain syndrome As above  3. Facet arthritis of lumbosacral region Continue core stretching strengthening  4. Lumbar spondylosis with myelopathy Proceed with a repeat epidural today at the L5-S1 level to assist with the right side sciatica.  He has responded very well to these in the past generally getting nearly complete relief of the sciatica for 2 months to 3 months and an 80% reduction in his low back pain  5. Sciatica, right side As above  6. Sciatica, left side Above and continue core stretching strengthening exercises.  Continue follow-up with his primary care physicians for baseline medical  care.    ----------------------------------------------------------------------------------------------------------------------  I am having Kathi Simpers. Franqui "[name]" maintain his insulin lispro, gabapentin, metFORMIN, Lantus SoloStar, naloxone, rosuvastatin,  aspirin EC, gabapentin, Multiple Vitamins-Minerals (MENS MULTI VITAMIN & MINERAL PO), montelukast, albuterol, albuterol, fluticasone furoate-vilanterol, empagliflozin, clopidogrel, nitroGLYCERIN, ketoconazole, dexlansoprazole, ammonium lactate, cyclobenzaprine, furosemide, ezetimibe, BD Pen Needle Nano 2nd Gen, Semaglutide (2 MG/DOSE), DULoxetine, DULoxetine, levocetirizine, lubiprostone, levothyroxine, spironolactone, Oxycodone HCl, and Oxycodone HCl. We administered triamcinolone acetonide, sodium chloride flush, ropivacaine (PF) 2 mg/mL (0.2%), lidocaine (PF), and iohexol. We will continue to administer sodium chloride flush.   Meds ordered this encounter  Medications   triamcinolone acetonide (KENALOG-40) injection 40 mg   sodium chloride flush (NS) 0.9 % injection 10 mL   ropivacaine (PF) 2 mg/mL (0.2%) (NAROPIN) injection 10 mL   lidocaine (PF) (XYLOCAINE) 1 % injection 5 mL   iohexol (OMNIPAQUE) 180 MG/ML injection 10 mL   Patient's Medications  New Prescriptions   No medications on file  Previous Medications   ALBUTEROL (PROVENTIL) (2.5 MG/3ML) 0.083% NEBULIZER SOLUTION    Take 3 mLs (2.5 mg total) by nebulization every 6 (six) hours as needed for wheezing or shortness of breath.   ALBUTEROL (VENTOLIN HFA) 108 (90 BASE) MCG/ACT INHALER    Inhale 2 puffs into the lungs daily.   AMMONIUM LACTATE (AMLACTIN) 12 % LOTION    Apply 1 Application topically as needed for dry skin.   ASPIRIN EC 81 MG TABLET    Take 1 tablet (81 mg total) by mouth daily.   BD PEN NEEDLE NANO 2ND GEN 32G X 4 MM MISC    USE AS DIRECTED 5 TO 6 TIMES DAILY DEPENDING ON MEALS AND BLOOD SUGAR   CLOPIDOGREL (PLAVIX) 75 MG TABLET    TAKE 1 TABLET BY MOUTH DAILY  WITH BREAKFAST   CYCLOBENZAPRINE (FLEXERIL) 10 MG TABLET    Take 1 tablet (10 mg total) by mouth 2 (two) times daily. TAKE 1 TABLET(10 MG) BY MOUTH TWICE DAILY AS NEEDED   DEXLANSOPRAZOLE (DEXILANT) 60 MG CAPSULE    Take 1 capsule (60 mg total) by mouth daily.   DULOXETINE (CYMBALTA) 30 MG CAPSULE    Take 1 capsule (30 mg total) by mouth daily.   DULOXETINE (CYMBALTA) 60 MG CAPSULE    Take 1 capsule (60 mg total) by mouth daily.   EMPAGLIFLOZIN (JARDIANCE) 10 MG TABS TABLET    TAKE 1 TABLET(10 MG) BY MOUTH DAILY BEFORE BREAKFAST   EZETIMIBE (ZETIA) 10 MG TABLET    TAKE 1 TABLET(10 MG) BY MOUTH DAILY   FLUTICASONE FUROATE-VILANTEROL (BREO ELLIPTA) 200-25 MCG/ACT AEPB    Inhale 1 puff into the lungs daily.   FUROSEMIDE (LASIX) 40 MG TABLET    Take 1 tablet (40 mg total) by mouth daily.   GABAPENTIN (NEURONTIN) 300 MG CAPSULE    Take 1 capsule (300 mg total) by mouth 3 (three) times daily.   GABAPENTIN (NEURONTIN) 800 MG TABLET    Take 800 mg by mouth at bedtime.   INSULIN LISPRO (HUMALOG) 100 UNIT/ML KIWKPEN    Inject 6-10 Units into the skin 3 (three) times daily.  sliding scale 1 unit for every 10 units of carbs   KETOCONAZOLE (NIZORAL) 2 % CREAM    Apply 1 Application topically daily.   LANTUS SOLOSTAR 100 UNIT/ML SOLOSTAR PEN    Inject 20-23 Units into the skin daily at 10 pm. Sliding scale based on blood glucose   LEVOCETIRIZINE (XYZAL) 5 MG TABLET    Take 1 tablet (5 mg total) by mouth daily at 12 noon.   LEVOTHYROXINE (SYNTHROID) 50 MCG TABLET    TAKE 1 TABLET BY MOUTH EVERY DAY ON  MONDAY THROUGH SATURDAY, 2 TABLETS DAILY ON SUNDAY   LUBIPROSTONE (AMITIZA) 8 MCG CAPSULE    Take 1 capsule (8 mcg total) by mouth 2 (two) times daily with a meal.   METFORMIN (GLUCOPHAGE) 1000 MG TABLET    Take 1,000 mg by mouth 2 (two) times daily with a meal.   MONTELUKAST (SINGULAIR) 10 MG TABLET    Take 10 mg by mouth at bedtime.   MULTIPLE VITAMINS-MINERALS (MENS MULTI VITAMIN & MINERAL PO)    Take 1 tablet  by mouth in the morning and at bedtime.   NALOXONE (NARCAN) NASAL SPRAY 4 MG/0.1 ML    For excess sedation from opioids   NITROGLYCERIN (NITROSTAT) 0.3 MG SL TABLET    DISSOLVE 1 TABLET UNDER THE TONGUE EVERY 5 MINUTES AS NEEDED FOR CHEST PAIN, MAX 3 DOSES   OXYCODONE HCL 10 MG TABS    Take 1 tablet (10 mg total) by mouth in the morning, at noon, in the evening, and at bedtime.   OXYCODONE HCL 10 MG TABS    Take 1 tablet (10 mg total) by mouth in the morning, at noon, in the evening, and at bedtime.   ROSUVASTATIN (CRESTOR) 5 MG TABLET    Take 5 mg by mouth daily with supper.   SEMAGLUTIDE, 2 MG/DOSE, 8 MG/3ML SOPN    Inject into the skin.   SPIRONOLACTONE (ALDACTONE) 25 MG TABLET    Take 0.5 tablets (12.5 mg total) by mouth daily.  Modified Medications   No medications on file  Discontinued Medications   No medications on file   ----------------------------------------------------------------------------------------------------------------------  Follow-up: Return in about 6 weeks (around 02/02/2024) for evaluation, med refill.   Procedure: L5-S1 LESI with fluoroscopic guidance and without moderate sedation  NOTE: The risks, benefits, and expectations of the procedure have been discussed and explained to the patient who was understanding and in agreement with suggested treatment plan. No guarantees were made.  DESCRIPTION OF PROCEDURE: Lumbar epidural steroid injection with no IV Versed, EKG, blood pressure, pulse, and pulse oximetry monitoring. The procedure was performed with the patient in the prone position under fluoroscopic guidance.  Sterile prep x3 was initiated and I then injected subcutaneous lidocaine to the overlying L5-S1 site after its fluoroscopic identifictation.  Using strict aseptic technique, I then advanced an 18-gauge Tuohy epidural needle in the midline using interlaminar approach via loss-of-resistance to saline technique. There was negative aspiration for heme or  CSF.  I  then confirmed position with both AP and Lateral fluoroscan.  2 cc of contrast dye were injected and a  total of 5 mL of Preservative-Free normal saline mixed with 40 mg of Kenalog and 1cc Ropicaine 0.2 percent were injected incrementally via the  epidurally placed needle. The needle was removed. The patient tolerated the injection well and was convalesced and discharged to home in stable condition. Should the patient have any post procedure difficulty they have been instructed on how to contact us for assistance.   Yevette Edwards, MD

## 2023-12-22 NOTE — Patient Instructions (Signed)
 ______________________________________________________________________    Post-Procedure Discharge Instructions  Instructions: Apply ice:  Purpose: This will minimize any swelling and discomfort after procedure.  When: Day of procedure, as soon as you get home. How: Fill a plastic sandwich bag with crushed ice. Cover it with a small towel and apply to injection site. How long: (15 min on, 15 min off) Apply for 15 minutes then remove x 15 minutes.  Repeat sequence on day of procedure, until you go to bed. Apply heat:  Purpose: To treat any soreness and discomfort from the procedure. When: Starting the next day after the procedure. How: Apply heat to procedure site starting the day following the procedure. How long: May continue to repeat daily, until discomfort goes away. Food intake: Start with clear liquids (like water) and advance to regular food, as tolerated.  Physical activities: Keep activities to a minimum for the first 8 hours after the procedure. After that, then as tolerated. Driving: If you have received any sedation, be responsible and do not drive. You are not allowed to drive for 24 hours after having sedation. Blood thinner: (Applies only to those taking blood thinners) You may restart your blood thinner 6 hours after your procedure. Insulin: (Applies only to Diabetic patients taking insulin) As soon as you can eat, you may resume your normal dosing schedule. Infection prevention: Keep procedure site clean and dry. Shower daily and clean area with soap and water. Post-procedure Pain Diary: Extremely important that this be done correctly and accurately. Recorded information will be used to determine the next step in treatment. For the purpose of accuracy, follow these rules: Evaluate only the area treated. Do not report or include pain from an untreated area. For the purpose of this evaluation, ignore all other areas of pain, except for the treated area. After your procedure,  avoid taking a long nap and attempting to complete the pain diary after you wake up. Instead, set your alarm clock to go off every hour, on the hour, for the initial 8 hours after the procedure. Document the duration of the numbing medicine, and the relief you are getting from it. Do not go to sleep and attempt to complete it later. It will not be accurate. If you received sedation, it is likely that you were given a medication that may cause amnesia. Because of this, completing the diary at a later time may cause the information to be inaccurate. This information is needed to plan your care. Follow-up appointment: Keep your post-procedure follow-up evaluation appointment after the procedure (usually 2 weeks for most procedures, 6 weeks for radiofrequencies). DO NOT FORGET to bring you pain diary with you.   Expect: (What should I expect to see with my procedure?) From numbing medicine (AKA: Local Anesthetics): Numbness or decrease in pain. You may also experience some weakness, which if present, could last for the duration of the local anesthetic. Onset: Full effect within 15 minutes of injected. Duration: It will depend on the type of local anesthetic used. On the average, 1 to 8 hours.  From steroids (Applies only if steroids were used): Decrease in swelling or inflammation. Once inflammation is improved, relief of the pain will follow. Onset of benefits: Depends on the amount of swelling present. The more swelling, the longer it will take for the benefits to be seen. In some cases, up to 10 days. Duration: Steroids will stay in the system x 2 weeks. Duration of benefits will depend on multiple posibilities including persistent irritating factors. Side-effects: If  present, they may typically last 2 weeks (the duration of the steroids). Frequent: Cramps (if they occur, drink Gatorade and take over-the-counter Magnesium 450-500 mg once to twice a day); water retention with temporary weight gain;  increases in blood sugar; decreased immune system response; increased appetite. Occasional: Facial flushing (red, warm cheeks); mood swings; menstrual changes. Uncommon: Long-term decrease or suppression of natural hormones; bone thinning. (These are more common with higher doses or more frequent use. This is why we prefer that our patients avoid having any injection therapies in other practices.)  Very Rare: Severe mood changes; psychosis; aseptic necrosis. From procedure: Some discomfort is to be expected once the numbing medicine wears off. This should be minimal if ice and heat are applied as instructed.  Call if: (When should I call?) You experience numbness and weakness that gets worse with time, as opposed to wearing off. New onset bowel or bladder incontinence. (Applies only to procedures done in the spine)  Emergency Numbers: Durning business hours (Monday - Thursday, 8:00 AM - 4:00 PM) (Friday, 9:00 AM - 12:00 Noon): [phone] After hours: [phone] NOTE: If you are having a problem and are unable connect with, or to talk to a provider, then go to your nearest urgent care or emergency department. If the problem is serious and urgent, please call 911. ______________________________________________________________________      ______________________________________________________________________    Preparing for your procedure  Appointments: If you think you may not be able to keep your appointment, call 24-48 hours in advance to cancel. We need time to make it available to others.  Procedure visits are for procedures only. During your procedure appointment there will be: NO Prescription Refills*. NO medication changes or discussions*. NO discussion of disability issues*. NO unrelated pain problem evaluations*. NO evaluations to order other pain procedures*. *These will be addressed at a separate and distinct evaluation encounter on the provider's evaluation schedule  and not during procedure days.  Instructions: Food intake: Avoid eating anything solid for at least 8 hours prior to your procedure. Clear liquid intake: You may take clear liquids such as water up to 2 hours prior to your procedure. (No carbonated drinks. No soda.) Transportation: Unless otherwise stated by your physician, bring a driver. (Driver cannot be a Market researcher, Pharmacist, community, or any other form of public transportation.) Morning Medicines: Except for blood thinners, take all of your other morning medications with a sip of water. Make sure to take your heart and blood pressure medicines. If your blood pressure's lower number is above 100, the case will be rescheduled. Blood thinners: Make sure to stop your blood thinners as instructed.  If you take a blood thinner, but were not instructed to stop it, call our office [phone] and ask to talk to a nurse. Not stopping a blood thinner prior to certain procedures could lead to serious complications. Diabetics on insulin: Notify the staff so that you can be scheduled 1st case in the morning. If your diabetes requires high dose insulin, take only  of your normal insulin dose the morning of the procedure and notify the staff that you have done so. Preventing infections: Shower with an antibacterial soap the morning of your procedure.  Build-up your immune system: Take 1000 mg of Vitamin C with every meal (3 times a day) the day prior to your procedure. Antibiotics: Inform the nursing staff if you are taking any antibiotics or if you have any conditions that may require antibiotics prior to procedures. (Example: recent joint  implants)   Pregnancy: If you are pregnant make sure to notify the nursing staff. Not doing so may result in injury to the fetus, including death.  Sickness: If you have a cold, fever, or any active infections, call and cancel or reschedule your procedure. Receiving steroids while having an infection may result in complications. Arrival:  You must be in the facility at least 30 minutes prior to your scheduled procedure. Tardiness: Your scheduled time is also the cutoff time. If you do not arrive at least 15 minutes prior to your procedure, you will be rescheduled.  Children: Do not bring any children with you. Make arrangements to keep them home. Dress appropriately: There is always a possibility that your clothing may get soiled. Avoid long dresses. Valuables: Do not bring any jewelry or valuables.  Reasons to call and reschedule or cancel your procedure: (Following these recommendations will minimize the risk of a serious complication.) Surgeries: Avoid having procedures within 2 weeks of any surgery. (Avoid for 2 weeks before or after any surgery). Flu Shots: Avoid having procedures within 2 weeks of a flu shots or . (Avoid for 2 weeks before or after immunizations). Barium: Avoid having a procedure within 7-10 days after having had a radiological study involving the use of radiological contrast. (Myelograms, Barium swallow or enema study). Heart attacks: Avoid any elective procedures or surgeries for the initial 6 months after a "Myocardial Infarction" (Heart Attack). Blood thinners: It is imperative that you stop these medications before procedures. Let us know if you if you take any blood thinner.  Infection: Avoid procedures during or within two weeks of an infection (including chest colds or gastrointestinal problems). Symptoms associated with infections include: Localized redness, fever, chills, night sweats or profuse sweating, burning sensation when voiding, cough, congestion, stuffiness, runny nose, sore throat, diarrhea, nausea, vomiting, cold or Flu symptoms, recent or current infections. It is specially important if the infection is over the area that we intend to treat. Heart and lung problems: Symptoms that may suggest an active cardiopulmonary problem include: cough, chest pain, breathing difficulties or shortness of  breath, dizziness, ankle swelling, uncontrolled high or unusually low blood pressure, and/or palpitations. If you are experiencing any of these symptoms, cancel your procedure and contact your primary care physician for an evaluation.  Remember:  Regular Business hours are:  Monday to Thursday 8:00 AM to 4:00 PM  Provider's Schedule: Delano Metz, MD:  Procedure days: Tuesday and Thursday 7:30 AM to 4:00 PM  Edward Jolly, MD:  Procedure days: Monday and Wednesday 7:30 AM to 4:00 PM Last  Updated: 09/14/2023 ______________________________________________________________________

## 2023-12-22 NOTE — Telephone Encounter (Signed)
 Pt contacted via telephone and author left voice mail informing of missed appointment and informed pt of future PT appointment date and time.   Grier Rocher PT, DPT  Physical Therapist - [place]  [facility] - [facility]  12:21 PM 12/22/23

## 2023-12-24 ENCOUNTER — Telehealth: Payer: Self-pay | Admitting: Physical Therapy

## 2023-12-24 ENCOUNTER — Ambulatory Visit: Payer: Medicare Other | Admitting: Physical Therapy

## 2023-12-24 NOTE — Telephone Encounter (Signed)
 Pt contacted via telephone and he reports he is still recovering from his procedure 2 days ago and apologizes for not calling in advance to cancel his appointment today. Pt reports he should feel up to coming to his PT appointments next week.   Casimiro Needle, PT, DPT, NCS, CSRS

## 2023-12-28 ENCOUNTER — Ambulatory Visit: Payer: Self-pay | Admitting: Licensed Clinical Social Worker

## 2023-12-29 ENCOUNTER — Ambulatory Visit: Payer: Medicare Other | Admitting: Physical Therapy

## 2023-12-29 DIAGNOSIS — R269 Unspecified abnormalities of gait and mobility: Secondary | ICD-10-CM

## 2023-12-29 DIAGNOSIS — M6281 Muscle weakness (generalized): Secondary | ICD-10-CM

## 2023-12-29 DIAGNOSIS — M5459 Other low back pain: Secondary | ICD-10-CM | POA: Diagnosis not present

## 2023-12-29 DIAGNOSIS — R262 Difficulty in walking, not elsewhere classified: Secondary | ICD-10-CM

## 2023-12-29 NOTE — Therapy (Signed)
 OUTPATIENT PHYSICAL THERAPY NEURO treatment    Patient Name: [name] MRN: [id number] DOB:[birth year], 63 y.o., male Today's Date: 12/29/2023   PCP: [name], MD  REFERRING PROVIDER: Lonell Face, MD   END OF SESSION:  PT End of Session - 12/29/23 1133     Visit Number 3    Number of Visits 24    Date for PT Re-Evaluation 02/25/24    PT Start Time 1140    PT Stop Time 1220    PT Time Calculation (min) 40 min    Equipment Utilized During Treatment Gait belt    Activity Tolerance Patient limited by pain;Patient limited by fatigue    Behavior During Therapy [facility] for tasks assessed/performed             Past Medical History:  Diagnosis Date   Allergy    dust, seasonal (worse in the fall).   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 Followed by Pain Specialist in [place], back and neck   Asthma    BRONCHITIS   Cataract    First Dx in 2012   Chronic combined systolic and diastolic congestive heart failure (HCC)    a. 03/2018 Echo: EF 30-35%; b. 07/2018 Echo: EF 35-40%; c. 09/2019 TEE: EF 40-45%; d. 12/2020 Echo: EF 35-40%; e.01/2022  Echo: EF 45-50%. sev apical HK; f. 01/2023 Echo: EF 40-45%, mod asymm basal-septal LVH, apical AK, nl RV fxn.   CKD (chronic kidney disease), stage II    Coronary artery disease    a. Prior Ant MI->s/p mult stents->LAD/RCA ([place]); b. 2016 Cath: nonobs dzs;  c. 04/2018 Cath/PCI: LCX 25m (3.25x15 Sierra DES); d. 02/2022 MV: high risk; e. 02/2022 Cath: LM nl, LAD 20p ISR, patent mid-stent, LCX patent stent, OM1 nl, OM2 50p, patent stent, OM3 40p, patent stent, RCA 40p ISR, 91m ISR, 75m/d, patent distal stent, RPDA patent stent, RPAV small, 80 (sl progression)-->Med Rx.   Deaf, left    Diabetes mellitus without complication (HCC)    TYPE 2   Diabetic peripheral neuropathy (HCC)    feet and hands   FUO (fever of unknown origin) 08/03/2018   GERD (gastroesophageal reflux disease)    Headache    muscle tension   Hyperlipidemia    Hypertension     Hyperthyroidism    Insomnia    Ischemic cardiomyopathy    a. 03/2018 Echo: EF 30-35%; b. 07/2018 Echo: EF 35-40%, Gr1 DD; c. 09/2019 TEE: EF 40-45%; d. 12/2020 Echo: EF 35-40%. GrI DD; e. 01/2022 Echo: EF 45-50%; f. 01/2023 Echo: EF 40-45%.   Knee pain, acute 05/06/2020   Left arm weakness 10/04/2019   Left leg weakness 12/01/2019   Lyme disease    Chronic   Myasthenia gravis (HCC)    (03/18/21 - no current treatment - "better than it has ever been" per pt)   Myocardial infarction (HCC) 2010   Palpitations    a. 10/2020 Zio: RSR, 88 avg. 3 brief SVT episodes (max 5 beats @ 128). Rare PACs/PVCs. Triggered events did not correlate w/ significant arrthymia - some w/ sinus tach.   Seasonal allergies    Sepsis (HCC)    a.07/2018 - unknown source. TEE neg for veg 09/2019.   Sleep apnea    CPAP   Wears hearing aid in both ears    Past Surgical History:  Procedure Laterality Date   BILATERAL CARPAL TUNNEL RELEASE Bilateral L in 2012 and R in 2013   CARDIAC CATHETERIZATION  Several Caths, most recent in  March 2016.   COLONOSCOPY WITH PROPOFOL N/A 01/10/2016   Procedure: COLONOSCOPY WITH PROPOFOL;  Surgeon: Midge Minium, MD;  Location: [facility] ENDOSCOPY;  Service: Endoscopy;  Laterality: N/A;   COLONOSCOPY WITH PROPOFOL N/A 04/14/2021   Procedure: COLONOSCOPY WITH PROPOFOL;  Surgeon: Midge Minium, MD;  Location: [facility];  Service: Endoscopy;  Laterality: N/A;  Diabetic - insulin and oral meds   CORONARY ANGIOPLASTY     CORONARY STENT INTERVENTION N/A 04/25/2018   Procedure: CORONARY STENT INTERVENTION;  Surgeon: Iran Ouch, MD;  Location: [facility] INVASIVE CV LAB;  Service: Cardiovascular;  Laterality: N/A;   ESOPHAGOGASTRODUODENOSCOPY (EGD) WITH PROPOFOL N/A 01/10/2016   Procedure: ESOPHAGOGASTRODUODENOSCOPY (EGD) WITH PROPOFOL;  Surgeon: Midge Minium, MD;  Location: [facility] ENDOSCOPY;  Service: Endoscopy;  Laterality: N/A;   ESOPHAGOGASTRODUODENOSCOPY (EGD) WITH PROPOFOL N/A 04/14/2021    Procedure: ESOPHAGOGASTRODUODENOSCOPY (EGD) WITH BIOPSY;  Surgeon: Midge Minium, MD;  Location: [facility];  Service: Endoscopy;  Laterality: N/A;   EYE SURGERY Bilateral 2012   cataract/bilateral vitrectomies   GIVENS CAPSULE STUDY N/A 08/20/2021   Procedure: GIVENS CAPSULE STUDY;  Surgeon: Midge Minium, MD;  Location: [facility] ENDOSCOPY;  Service: Endoscopy;  Laterality: N/A;   LEFT HEART CATH AND CORONARY ANGIOGRAPHY Left 04/25/2018   Procedure: LEFT HEART CATH AND CORONARY ANGIOGRAPHY;  Surgeon: Iran Ouch, MD;  Location: [facility] INVASIVE CV LAB;  Service: Cardiovascular;  Laterality: Left;   LEFT HEART CATH AND CORONARY ANGIOGRAPHY Left 02/02/2022   Procedure: LEFT HEART CATH AND CORONARY ANGIOGRAPHY;  Surgeon: Iran Ouch, MD;  Location: [facility] INVASIVE CV LAB;  Service: Cardiovascular;  Laterality: Left;   TEE WITHOUT CARDIOVERSION N/A 09/05/2018   Procedure: TRANSESOPHAGEAL ECHOCARDIOGRAM (TEE);  Surgeon: Iran Ouch, MD;  Location: [facility] ORS;  Service: Cardiovascular;  Laterality: N/A;   TONSILLECTOMY AND ADENOIDECTOMY     As a child   TUNNELED VENOUS CATHETER PLACEMENT     removed   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) 12/02/2021   Gastropathy 08/13/2021   Iron deficiency anemia due to chronic blood loss    Pain due to onychomycosis of toenails of both feet 11/11/2020   Lung involvement associated with another disorder (HCC) 05/17/2020   Lower urinary tract symptoms 09/15/2019   Incomplete bladder emptying 09/15/2019   Carpal tunnel syndrome on both sides  07/12/2019   Idiopathic peripheral neuropathy 04/21/2019   Sensory ataxia 03/09/2019   OSA on CPAP 07/08/2018   Congestive heart failure (CHF) (HCC) 05/05/2018   Unstable angina (HCC) 04/26/2018   Ischemic cardiomyopathy 04/26/2018   Chronic combined systolic and diastolic CHF (congestive heart failure) (HCC) 04/26/2018   Anginal equivalent (HCC) 04/25/2018   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HCC) 01/25/2018   Vitamin D deficiency, unspecified 08/12/2017   Ptosis of left eyelid 01/08/2017   Dermatitis 12/24/2016   Benign neoplasm of sigmoid colon    Benign neoplasm of descending colon    Benign neoplasm of transverse colon    Coronary artery disease involving native coronary artery with angina pectoris (HCC) 10/22/2015   Coronary artery disease 08/22/2015   Myasthenia gravis (HCC) 08/22/2015  Chronic left-sided low back pain 08/22/2015   Hypertension 08/22/2015   GERD (gastroesophageal reflux disease) 08/22/2015   Hyperlipidemia 08/22/2015   Insomnia 08/22/2015   Type 2 diabetes mellitus with diabetic polyneuropathy, with long-term current use of insulin (HCC) 08/22/2015   Asthma 08/22/2015   Lyme disease 06/05/1992    ONSET DATE: 6months   REFERRING DIAG:  Diagnosis  R26.89 (ICD-10-CM) - Imbalance    THERAPY DIAG:  Other low back pain  Muscle weakness (generalized)  Gait difficulty  Difficulty in walking, not elsewhere classified  Rationale for Evaluation and Treatment: Rehabilitation  SUBJECTIVE:                                                                                                                                                                                             SUBJECTIVE STATEMENT:  Pt reports that he is doing well. States that he had epidural steroid injection on 3/19. Reports that his pain decreased to 4/10 at worst. Reports that injection was administered on the R side on the lumbar spine this time, with significant  improvement.  States that he has been performing all HEP daily for the last 2 weeks.   From EVAL:  Hx of mysathenia gravis. States that he has had worsening balance over the last few months, pt utilities SPC with community mobility. States that he has begun using [facility] throughout house with multiple placed throughout house. Pt reports to PT with chronic low back pain currently 5/10. Dull ache. Pain increase to sharp pain with twisting.   Pt accompanied by: self  PERTINENT HISTORY:   From Neurology: Patient reports severe neuropathy in both his feet and his hands. Reports numbness, tingling, incoordination, electrical shocks, muscle mass, weakness. Does not a tremor in his hands (left > right). Tremor occurs when he first wakes up when he goes to a text someone. Denies burning, itching, coldness. He reports that when he tries to walk, he gets severe weakness in his triceps and in his thighs bilaterally. He says the weakness is what concerns him the most as he does not want to lose his strength. He reports that his balance is bad (two falls as described below. He also has multiple "near misses." He is using a cane which he uses if he leaves the house. He is taking Gabapentin 300 mg TID and 800 mg at night with no reported side effects. Says he gets intermittent dizziness that is sperate from a side effect of the gabapentin.   Patient reports two falls in the last 90 days. In one, he felt dizzy and sat down and then leaned over and fell.  He says he hit his head hard but did not lose consciousness. Reports headache and confusion that went away the next day. Notes that he thought he may have had a concussion but did not seek ED evaluation. Second fall was about 3 weeks ago, he was walking across the lawn and blacked out and hit the ground. Denies hitting his head for this fall. Patient says he has CHF and has trouble breathing as we   PAIN:  Are you having pain? Yes: NPRS scale: 5\10 Pain location: back   Pain description: ache Aggravating factors: movement Relieving factors: rest  PRECAUTIONS: None  RED FLAGS: Chronic lumbar stenosis.     WEIGHT BEARING RESTRICTIONS: No  FALLS: Has patient fallen in last 6 months? No multiple near falls   LIVING ENVIRONMENT: Lives with: lives with their spouse Lives in: House/apartment Stairs: Yes: Internal: 1 steps; planning to add grab bars and External: 1 steps; planning to add grab bars   Has following equipment at home: Single point cane, Walker - 2 wheeled, Wheelchair (manual), Grab bars, and has not use WC or RW for a while, and has access to power scooter if needed.    PLOF: Independent with household mobility without device, Independent with community mobility with device, and use of  SPC  PATIENT GOALS: improve balance and reduce reliance on SPC in the home   OBJECTIVE:  Note: Objective measures were completed at Evaluation unless otherwise noted.   COGNITION: Overall cognitive status: Within functional limits for tasks assessed   SENSATION: Light touch: Impaired  Baseline neuropathy. Just proximal to knee. No light touch in ankles or feet.   COORDINATION: Ankle to knee. Mild dysmetria and weakness on the LLE.  Decreased speed bil     POSTURE: rounded shoulders, forward head, and increased thoracic kyphosis  LOWER EXTREMITY ROM:     Mild decreased hip flexion ROM. All other WFL  LOWER EXTREMITY MMT:    MMT Right Eval Left Eval  Hip flexion 4 4-  Hip extension 4+ 4+  Hip abduction 4 4  Hip adduction 4+ 4+  Hip internal rotation    Hip external rotation    Knee flexion 4+ 4  Knee extension 5 4+  Ankle dorsiflexion 4+ 4+  Ankle plantarflexion    Ankle inversion    Ankle eversion    (Blank rows = not tested)  BED MOBILITY:  Sit to supine Modified independence Supine to sit Modified independence Rolling to Right Modified independence Rolling to Left Modified independence  TRANSFERS: Assistive device  utilized: Single point cane and None  Sit to stand: Modified independence Stand to sit: Modified independence Chair to chair: Modified independence Floor:  not assessed   RAMP:  Level of Assistance: SBA Assistive device utilized: Single point cane Ramp Comments:   CURB:  Level of Assistance: Modified independence Assistive device utilized:  rail Curb Comments:   STAIRS: Level of Assistance: Modified independence Stair Negotiation Technique: Alternating Pattern  with Bilateral Rails Number of Stairs: 4  Height of Stairs: 6  Comments:   GAIT: Gait pattern: step through pattern and wide BOS Distance walked: 60 Assistive device utilized: Single point cane and None Level of assistance: SBA Comments: decreased step length   FUNCTIONAL TESTS:  5 times sit to stand: 22.84sec  Timed up and go (TUG): 16.72 sec  6 minute walk test: to be completed  10 meter walk test: 0.59m/s  Berg Balance Scale: 42 Functional gait assessment: 18  PATIENT SURVEYS:  ABC scale to  be completed                                                                                                                               TREATMENT DATE: 12/29/2023  Nustep reciprocal movement and cardiovascular training x 6 min level 1-4 with cues for consistent SPM through varied resistance.   Standing on airex pad: normal BOS 3 x 20sec.  Forward Foot tap on 6 inch step x 12 bil   1 foot on pad 1 foot on 6 inch step 30x 10 sec bil  Lateral foot tap from pad to 6 inch step x 12 bil  Sit<>stand 2 x 5 without UE support   Weighted gait with 2.5# AW 2 x 357ft with CGA for safety intermittent lateral veer bil, L>R. But able to correct to prevent fall without additional assist from PT.   Throughout session PT provided CGA with gait belt in place to reduce fall risk and improve pt safety. Mild lateral LOB when performing foot tap on step from airex pad, requiring min assist to prevent lateral LOB.  No increase in pain  reported by pt throughout session.  PATIENT EDUCATION: Education details: POC, HEP.  Person educated: Patient Education method: Explanation Education comprehension: verbalized understanding  HOME EXERCISE PROGRAM: Access Code: [email or web address] URL: [email or web address] Date: 12/08/2023 Prepared by: Grier Rocher  Exercises - Standing Single Leg Stance with Counter Support  - 1 x daily - 3-4 x weekly - 3 sets - 4 reps - 10 sec hold - Standing Tandem Balance with Counter Support  - 1 x daily - 3-4 x weekly - 3 sets - 4 reps - 15sec hold - Side Stepping with Counter Support  - 1 x daily - 3-4 x weekly - 3 sets - 5 reps - Backward Walking with Counter Support  - 1 x daily - 3-4 x weekly - 3 sets - 5 reps - Narrow Stance with Counter Support  - 1 x daily - 3-4 x weekly - 3 sets - 4 reps - 15 hold  GOALS: Goals reviewed with patient? Yes  SHORT TERM GOALS: Target date: 12/31/2023    Patient will be independent in home exercise program to improve strength/mobility for better functional independence with ADLs. Baseline:to be given at visit 2  Goal status: INITIAL   LONG TERM GOALS: Target date: 02/25/2024    Patient will increase ABC score by 5points     to demonstrate statistically significant improvement in mobility and quality of life.  Baseline: 68.44% Goal status: INITIAL  2.  Patient (> 26 years old) will complete five times sit to stand test in < 15 seconds indicating an increased LE strength and improved balance. Baseline: 22.84sec Goal status: INITIAL  3.  Patient will increase Berg Balance score by > 6 points to demonstrate decreased fall risk during functional activities Baseline: 42 Goal status: INITIAL  4.  Patient will increase 10 meter walk test to >1.46m/s as to  improve gait speed for better community ambulation and to reduce fall risk. Baseline: 0.75m/s Goal status: INITIAL  5.  Patient will reduce timed up and go to <13 seconds to reduce fall risk and  demonstrate improved transfer/gait ability. Baseline: 16.72 Goal status: INITIAL  6.  Patient will increaseFGA score to 22 as to demonstrate reduced fall risk and improved dynamic gait balance for better safety with community/home ambulation.   Baseline: 18 Goal status: INITIAL   7.  Patient will increase 6 min walk test by 153ft reduced fall risk and improved dynamic gait balance for better safety with community/home ambulation.   Baseline: 1019ft no AD  Goal status: INITIAL ASSESSMENT:  CLINICAL IMPRESSION: Patient is a 62 y.o. male who was seen today for physical therapy treatment for balance impairments secondary to MG and neuropathy. Pt tolerated increased time in standing on this dya without increased back of BLE pain. Noted to have lateral veer and lateral bias with gait and dynamic movement form airex pad, requiring CGA-min assist to prevent lateral LOB. As pt continues to tolerate time in standing, will be beneficial to increase SLS tasks Pt will benefit from skilled PT to address strength and balance deficits and allow improved overall QoL and increase independence.    OBJECTIVE IMPAIRMENTS: Abnormal gait, cardiopulmonary status limiting activity, decreased activity tolerance, decreased balance, decreased coordination, decreased endurance, decreased mobility, difficulty walking, and decreased strength.   ACTIVITY LIMITATIONS: carrying, lifting, standing, and locomotion level  PARTICIPATION LIMITATIONS: cleaning, laundry, shopping, community activity, and yard work  PERSONAL FACTORS: 1-2 comorbidities: neuropathy, MG  are also affecting patient's functional outcome.   REHAB POTENTIAL: Good  CLINICAL DECISION MAKING: Evolving/moderate complexity  EVALUATION COMPLEXITY: Moderate  PLAN:  PT FREQUENCY: 1-2x/week  PT DURATION: 12 weeks  PLANNED INTERVENTIONS: 97110-Therapeutic exercises, 97530- Therapeutic activity, [id number]- Neuromuscular re-education, 97535- Self Care,  [id number]- Manual therapy, [phone]- Gait training, Patient/Family education, Balance training, Stair training, DME instructions, Moist heat, and Biofeedback  PLAN FOR NEXT SESSION:   Continue Dynamic balance training. Increase SLS tasks    Golden Pop, PT 12/29/2023, 11:34 AM

## 2023-12-30 ENCOUNTER — Other Ambulatory Visit: Payer: Self-pay | Admitting: Cardiovascular Disease

## 2023-12-30 ENCOUNTER — Telehealth: Payer: Self-pay | Admitting: Cardiovascular Disease

## 2023-12-30 LAB — BASIC METABOLIC PANEL WITH GFR
BUN/Creatinine Ratio: 23 (ref 10–24)
BUN: 32 mg/dL — ABNORMAL HIGH (ref 8–27)
CO2: 25 mmol/L (ref 20–29)
Calcium: 10 mg/dL (ref 8.6–10.2)
Chloride: 98 mmol/L (ref 96–106)
Creatinine, Ser: 1.38 mg/dL — ABNORMAL HIGH (ref 0.76–1.27)
Glucose: 107 mg/dL — ABNORMAL HIGH (ref 70–99)
Potassium: 4.8 mmol/L (ref 3.5–5.2)
Sodium: 140 mmol/L (ref 134–144)
eGFR: 57 mL/min/{1.73_m2} — ABNORMAL LOW (ref >59)

## 2023-12-30 NOTE — Telephone Encounter (Signed)
 Patient identification verified by 2 forms. Shade Flood, RN     Called and spoke to patient  Patient states:  - wanted to follow up regarding kidney function from BMP yesterday.  Per provider notes. Kidney function has improved.   Result Notes   Parke Poisson, RN 12/30/2023  2:00 PM EDT Back to Top    Results released to [employer], PA-C 12/30/2023 12:59 PM EDT     Labs show improved kidney function     Patient Communication   Add Comments   Seen Back to Top    Cadence Lorna Few reviewed your recent lab work and noted the following:   Labs show improved kidney function   Written by Parke Poisson, RN on 12/30/2023  2:00 PM EDT Seen by patient [name] on 12/30/2023  2:07 PM   Patient verbalized understanding of results, no further questions at this time

## 2023-12-30 NOTE — Telephone Encounter (Signed)
 Pt calling to discuss lab results.

## 2023-12-31 ENCOUNTER — Ambulatory Visit: Payer: Medicare Other | Admitting: Physical Therapy

## 2024-01-05 ENCOUNTER — Ambulatory Visit: Payer: Medicare Other

## 2024-01-05 LAB — HM DIABETES EYE EXAM

## 2024-01-07 ENCOUNTER — Ambulatory Visit: Payer: Medicare Other | Admitting: Physical Therapy

## 2024-01-11 ENCOUNTER — Ambulatory Visit (INDEPENDENT_AMBULATORY_CARE_PROVIDER_SITE_OTHER): Payer: Self-pay | Admitting: Licensed Clinical Social Worker

## 2024-01-11 DIAGNOSIS — F439 Reaction to severe stress, unspecified: Secondary | ICD-10-CM

## 2024-01-11 DIAGNOSIS — F331 Major depressive disorder, recurrent, moderate: Secondary | ICD-10-CM | POA: Diagnosis not present
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
## 2024-01-12 ENCOUNTER — Ambulatory Visit: Payer: Self-pay | Admitting: Medical

## 2024-01-12 ENCOUNTER — Ambulatory Visit: Payer: Medicare Other

## 2024-01-14 ENCOUNTER — Ambulatory Visit: Payer: Medicare Other | Admitting: Physical Therapy

## 2024-01-15 ENCOUNTER — Other Ambulatory Visit: Payer: Self-pay | Admitting: Cardiovascular Disease

## 2024-01-15 DIAGNOSIS — I5022 Chronic systolic (congestive) heart failure: Secondary | ICD-10-CM

## 2024-01-17 ENCOUNTER — Ambulatory Visit: Attending: Neurology

## 2024-01-19 ENCOUNTER — Telehealth: Payer: Self-pay | Admitting: Physical Therapy

## 2024-01-19 ENCOUNTER — Ambulatory Visit: Payer: Medicare Other | Admitting: Physical Therapy

## 2024-01-19 NOTE — Telephone Encounter (Signed)
 Pt contacted via telephone and author left voice mail informing pt of missed visit and company no-show policy with plan to remove his remaining therapy appointments at this time. Requested patient call if he would like to return for additional therapy services.  Carlen Chasten, PT, DPT, NCS, CSRS

## 2024-01-21 ENCOUNTER — Ambulatory Visit: Payer: Medicare Other | Admitting: Physical Therapy

## 2024-01-22 ENCOUNTER — Other Ambulatory Visit: Payer: Self-pay | Admitting: Anesthesiology

## 2024-01-25 ENCOUNTER — Ambulatory Visit: Payer: Medicare Other | Admitting: Licensed Clinical Social Worker

## 2024-01-25 DIAGNOSIS — F331 Major depressive disorder, recurrent, moderate: Secondary | ICD-10-CM | POA: Diagnosis not present

## 2024-01-25 DIAGNOSIS — F439 Reaction to severe stress, unspecified: Secondary | ICD-10-CM | POA: Diagnosis not present

## 2024-01-25 NOTE — Progress Notes (Signed)
 THERAPIST PROGRESS NOTE  Session Time: 11:02pm-11:55pm  Participation Level: Active  Behavioral Response: CasualAlertAnxious  Type of Therapy: Individual Therapy  Treatment Goals addressed:  Problem: BH CCP Acute or Chronic Trauma Reaction     Dates: Start:  01/11/24       Disciplines: Interdisciplinary, PROVIDER        Goal: LTG: Elimination of maladaptive behaviors and thinking patterns which interfere with resolution of trauma as evidenced by self report     Dates: Start:  01/11/24    Expected End:  04/11/24       Disciplines: Interdisciplinary, PROVIDER             Goal: LTG: Develop and implement effective coping skills to carry out normal responsibilities and participate constructively in relationships as evidenced by self report     Dates: Start:  01/11/24    Expected End:  04/11/24       Disciplines: Interdisciplinary, PROVIDER             Goal: LTG: Recall traumatic events without becoming overwhelmed with negative emotions     Dates: Start:  01/11/24    Expected End:  04/11/24       Disciplines: Interdisciplinary, PROVIDER             Goal: STG: Edgar "[name]" will identify coping strategies to deal with trauma memories and the associated emotional reaction     Dates: Start:  01/11/24    Expected End:  04/11/24       Disciplines: Interdisciplinary, PROVIDER             Goal: STG: Edgar "[name]" will cooperate with treatment in an effort to reduce PHQ-9 assessment scores     Dates: Start:  01/11/24    Expected End:  04/11/24       Disciplines: Interdisciplinary, PROVIDER      ProgressTowards Goals: Progressing  Interventions: Supportive and Other: EMDR  Summary: [name] is a 63 y.o. male who presents with symptoms of anxiety. Patient reports symptoms to include uncontrollable worry, anxious feelings, avoidance, tension, restlessness. Pt was oriented times 5. Pt was cooperative and engaged. Pt denies SI/HI/AVH.   The patient continued EMDR  treatment by processing memories related to feeling unaccepted by his father. He worked on reframing the thought "I'm overlooked" to "There is nothing wrong with who I am." The patient reported a decrease in his subjective units of distress from 9 to 1.   During the session, we debriefed and reflected on the patient's feelings about being deceived by certain individuals in his life. We discussed the commonalities between him and his mother, as well as the barriers that have affected his closeness with his father. The patient continues to draw connections from his past to his current legal mitigation. He expressed feelings of misplaced guilt, believing he should have known that certain individuals in his life could not be trusted.   The patient and I will continue to address this negative cognition in future sessions.   Suicidal/Homicidal: Nowithout intent/plan  Therapist Response: Cln utilized active and supportive reflection to create safe environment for patient to process recent life stressors. Clinician assessed for current symptoms, stressors, safety since last session.  Continued EMDR treatment and reprocessing trauma memories.   Plan: Return again in 2 weeks.  Diagnosis: MDD (major depressive disorder), recurrent episode, moderate (HCC)  Trauma and stressor-related disorder   Collaboration of Care: : AEB psychiatrist can access notes and cln. Will review psychiatrists' notes. Check in  with the patient and will see LCSW per availability. Patient agreed with treatment recommendations.   Patient/Guardian was advised Release of Information must be obtained prior to any record release in order to collaborate their care with an outside provider. Patient/Guardian was advised if they have not already done so to contact the registration department to sign all necessary forms in order for us  to release information regarding their care.   Consent: Patient/Guardian gives verbal consent for treatment  and assignment of benefits for services provided during this visit. Patient/Guardian expressed understanding and agreed to proceed.   Edgar Slot, LCSW 01/25/2024

## 2024-01-26 ENCOUNTER — Ambulatory Visit: Payer: Medicare Other | Admitting: Physical Therapy

## 2024-01-26 ENCOUNTER — Other Ambulatory Visit: Payer: Self-pay | Admitting: *Deleted

## 2024-01-26 ENCOUNTER — Telehealth: Payer: Self-pay | Admitting: Anesthesiology

## 2024-01-26 NOTE — Telephone Encounter (Signed)
Rx request sent to Dr. Adams. 

## 2024-01-26 NOTE — Telephone Encounter (Signed)
 Patient needs script for cyclobenzaprine  sent in. Pharmacy says they do not have a script to fill.

## 2024-01-26 NOTE — Progress Notes (Unsigned)
 Safety precautions to be maintained throughout the outpatient stay will include: orient to surroundings, keep bed in low position, maintain call bell within reach at all times, provide assistance with transfer out of bed and ambulation.

## 2024-01-27 ENCOUNTER — Other Ambulatory Visit: Payer: Self-pay | Admitting: Cardiovascular Disease

## 2024-01-27 ENCOUNTER — Ambulatory Visit: Admitting: Physical Therapy

## 2024-01-28 ENCOUNTER — Ambulatory Visit: Payer: Medicare Other | Admitting: Physical Therapy

## 2024-01-31 ENCOUNTER — Ambulatory Visit: Attending: Medical | Admitting: Medical

## 2024-01-31 ENCOUNTER — Encounter: Payer: Self-pay | Admitting: Anesthesiology

## 2024-01-31 ENCOUNTER — Ambulatory Visit: Attending: Anesthesiology | Admitting: Anesthesiology

## 2024-01-31 ENCOUNTER — Encounter: Payer: Self-pay | Admitting: Medical

## 2024-01-31 VITALS — BP 132/71 | HR 94 | Ht 67.0 in | Wt 213.6 lb

## 2024-01-31 DIAGNOSIS — I951 Orthostatic hypotension: Secondary | ICD-10-CM

## 2024-01-31 DIAGNOSIS — M5432 Sciatica, left side: Secondary | ICD-10-CM

## 2024-01-31 DIAGNOSIS — N182 Chronic kidney disease, stage 2 (mild): Secondary | ICD-10-CM | POA: Diagnosis not present

## 2024-01-31 DIAGNOSIS — M4716 Other spondylosis with myelopathy, lumbar region: Secondary | ICD-10-CM | POA: Diagnosis not present

## 2024-01-31 DIAGNOSIS — I10 Essential (primary) hypertension: Secondary | ICD-10-CM | POA: Diagnosis not present

## 2024-01-31 DIAGNOSIS — M542 Cervicalgia: Secondary | ICD-10-CM

## 2024-01-31 DIAGNOSIS — G894 Chronic pain syndrome: Secondary | ICD-10-CM | POA: Diagnosis not present

## 2024-01-31 DIAGNOSIS — M47817 Spondylosis without myelopathy or radiculopathy, lumbosacral region: Secondary | ICD-10-CM

## 2024-01-31 DIAGNOSIS — Z79899 Other long term (current) drug therapy: Secondary | ICD-10-CM

## 2024-01-31 DIAGNOSIS — M5431 Sciatica, right side: Secondary | ICD-10-CM

## 2024-01-31 DIAGNOSIS — M545 Low back pain, unspecified: Secondary | ICD-10-CM

## 2024-01-31 DIAGNOSIS — I5022 Chronic systolic (congestive) heart failure: Secondary | ICD-10-CM | POA: Diagnosis not present

## 2024-01-31 DIAGNOSIS — R29898 Other symptoms and signs involving the musculoskeletal system: Secondary | ICD-10-CM

## 2024-01-31 DIAGNOSIS — F1190 Opioid use, unspecified, uncomplicated: Secondary | ICD-10-CM | POA: Diagnosis not present

## 2024-01-31 DIAGNOSIS — A6923 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: Secondary | ICD-10-CM

## 2024-01-31 DIAGNOSIS — M51362 Other intervertebral disc degeneration, lumbar region with discogenic back pain and lower extremity pain: Secondary | ICD-10-CM

## 2024-01-31 MED ORDER — OXYCODONE HCL 10 MG PO TABS: 10.0000 mg | ORAL_TABLET | Freq: Four times a day (QID) | ORAL | 0 refills | Status: AC

## 2024-01-31 MED ORDER — CYCLOBENZAPRINE HCL 10 MG PO TABS
10.0000 mg | ORAL_TABLET | Freq: Two times a day (BID) | ORAL | 3 refills | Status: DC
Start: 2024-01-31 — End: 2024-06-06

## 2024-01-31 MED ORDER — OXYCODONE HCL 10 MG PO TABS: 10.0000 mg | ORAL_TABLET | Freq: Four times a day (QID) | ORAL | 0 refills | Status: DC

## 2024-01-31 NOTE — Patient Instructions (Signed)
 Medication Instructions:  Your Physician recommend you continue on your current medication as directed.    *If you need a refill on your cardiac medications before your next appointment, please call your pharmacy*  Lab Work: No labs ordered today  If you have labs (blood work) drawn today and your tests are completely normal, you will receive your results only by: MyChart Message (if you have MyChart) OR A paper copy in the mail If you have any lab test that is abnormal or we need to change your treatment, we will call you to review the results.  Testing/Procedures: No test ordered today   Follow-Up: At [facility], you and your health needs are our priority.  As part of our continuing mission to provide you with exceptional heart care, our providers are all part of one team.  This team includes your primary Cardiologist (physician) and Advanced Practice Providers or APPs (Physician Assistants and Nurse Practitioners) who all work together to provide you with the care you need, when you need it.  Your next appointment:   6 month(s)  Provider:   Antionette Kirks, MD or Cadence Gennaro Khat, PA-C

## 2024-01-31 NOTE — Progress Notes (Signed)
 Virtual Visit via Telephone Note  I connected with [name] on 01/31/24 at  4:20 PM EDT by telephone and verified that I am speaking with the correct person using two identifiers.  Location: Patient: Home Provider: [facility] control center   I discussed the limitations, risks, security and privacy concerns of performing an evaluation and management service by telephone and the availability of in person appointments. I also discussed with the patient that there may be a patient responsible charge related to this service. The patient expressed understanding and agreed to proceed.   History of Present Illness: I was able to reach out to [name] via telephone as we could not like for the video portion of the conference.  He had a epidural injection about 6 weeks ago and reports that his sciatica pain has abated both right and left side.  He is now more active able to sleep better at night and is able to walk his dog without lower extremity discomfort.  He still has some back pain residual and has been on chronic opioid therapy for that and hip and knee pain.  He takes oxycodone  4 times a day and this continues to work well for him.  Otherwise he is in his usual state of health with no changes in lower extremity strength function bowel or bladder function.  The quality characteristic and distribution of his low back pain is stable with no recent changes noted.  Its mainly an aching gnawing pain with pain on extension of the low back that radiates into the hip and buttock region.  At present he is no longer having sciatica.  His muscle cramps have abated as well. Review of systems: General: No fevers or chills Pulmonary: No shortness of breath or dyspnea Cardiac: No angina or palpitations or lightheadedness GI: No abdominal pain or constipation Psych: No depression     Observations/Objective:  Current Outpatient Medications:    cyclobenzaprine  (FLEXERIL ) 10 MG tablet, Take 1 tablet (10 mg  total) by mouth 2 (two) times daily., Disp: 60 tablet, Rfl: 3   [START ON 03/24/2024] Oxycodone  HCl 10 MG TABS, Take 1 tablet (10 mg total) by mouth in the morning, at noon, in the evening, and at bedtime., Disp: 120 tablet, Rfl: 0   albuterol  (PROVENTIL ) (2.5 MG/3ML) 0.083% nebulizer solution, Take 3 mLs (2.5 mg total) by nebulization every 6 (six) hours as needed for wheezing or shortness of breath., Disp: 75 mL, Rfl: 2   albuterol  (VENTOLIN  HFA) 108 (90 Base) MCG/ACT inhaler, Inhale 2 puffs into the lungs daily., Disp: , Rfl:    ammonium lactate  (AMLACTIN) 12 % lotion, Apply 1 Application topically as needed for dry skin., Disp: 400 g, Rfl: 3   aspirin  EC 81 MG tablet, Take 1 tablet (81 mg total) by mouth daily., Disp: 90 tablet, Rfl: 3   BD PEN NEEDLE NANO 2ND GEN 32G X 4 MM MISC, USE AS DIRECTED 5 TO 6 TIMES DAILY DEPENDING ON MEALS AND BLOOD SUGAR, Disp: , Rfl:    clopidogrel  (PLAVIX ) 75 MG tablet, TAKE 1 TABLET BY MOUTH DAILY WITH BREAKFAST, Disp: 30 tablet, Rfl: 0   dexlansoprazole  (DEXILANT ) 60 MG capsule, Take 1 capsule (60 mg total) by mouth daily. (Patient not taking: Reported on 01/31/2024), Disp: 30 capsule, Rfl: 2   DULoxetine  (CYMBALTA ) 30 MG capsule, Take 1 capsule (30 mg total) by mouth daily., Disp: 90 capsule, Rfl: 1   DULoxetine  (CYMBALTA ) 60 MG capsule, Take 1 capsule (60 mg total) by mouth  daily., Disp: 90 capsule, Rfl: 1   ezetimibe  (ZETIA ) 10 MG tablet, TAKE 1 TABLET(10 MG) BY MOUTH DAILY, Disp: 90 tablet, Rfl: 0   fluticasone  furoate-vilanterol (BREO ELLIPTA ) 200-25 MCG/ACT AEPB, Inhale 1 puff into the lungs daily., Disp: , Rfl:    furosemide  (LASIX ) 40 MG tablet, Take 1 tablet (40 mg total) by mouth daily., Disp: 90 tablet, Rfl: 3   gabapentin  (NEURONTIN ) 300 MG capsule, Take 1 capsule (300 mg total) by mouth 3 (three) times daily., Disp: 90 capsule, Rfl: 1   gabapentin  (NEURONTIN ) 800 MG tablet, Take 800 mg by mouth at bedtime., Disp: , Rfl:    insulin  lispro (HUMALOG ) 100  UNIT/ML KiwkPen, Inject 6-10 Units into the skin 3 (three) times daily.  sliding scale 1 unit for every 10 units of carbs (Patient not taking: Reported on 01/31/2024), Disp: , Rfl:    JARDIANCE  10 MG TABS tablet, TAKE 1 TABLET(10 MG) BY MOUTH DAILY BEFORE BREAKFAST, Disp: 90 tablet, Rfl: 3   ketoconazole  (NIZORAL ) 2 % cream, Apply 1 Application topically daily., Disp: 60 g, Rfl: 0   LANTUS  SOLOSTAR 100 UNIT/ML Solostar Pen, Inject 20-23 Units into the skin daily at 10 pm. Sliding scale based on blood glucose, Disp: , Rfl: 0   levocetirizine (XYZAL ) 5 MG tablet, Take 1 tablet (5 mg total) by mouth daily at 12 noon., Disp: 90 tablet, Rfl: 1   levothyroxine  (SYNTHROID ) 50 MCG tablet, TAKE 1 TABLET BY MOUTH EVERY DAY ON MONDAY THROUGH SATURDAY, 2 TABLETS DAILY ON SUNDAY, Disp: 102 tablet, Rfl: 0   lubiprostone  (AMITIZA ) 8 MCG capsule, Take 1 capsule (8 mcg total) by mouth 2 (two) times daily with a meal., Disp: 180 capsule, Rfl: 1   metFORMIN  (GLUCOPHAGE ) 1000 MG tablet, Take 1,000 mg by mouth 2 (two) times daily with a meal., Disp: , Rfl:    montelukast  (SINGULAIR ) 10 MG tablet, Take 10 mg by mouth at bedtime., Disp: , Rfl:    Multiple Vitamins-Minerals (MENS MULTI VITAMIN & MINERAL PO), Take 1 tablet by mouth in the morning and at bedtime., Disp: , Rfl:    naloxone  (NARCAN ) nasal spray 4 mg/0.1 mL, For excess sedation from opioids, Disp: 1 kit, Rfl: 2   nitroGLYCERIN  (NITROSTAT ) 0.3 MG SL tablet, DISSOLVE 1 TABLET UNDER THE TONGUE EVERY 5 MINUTES AS NEEDED FOR CHEST PAIN, MAX 3 DOSES, Disp: 25 tablet, Rfl: 0   [START ON 02/23/2024] Oxycodone  HCl 10 MG TABS, Take 1 tablet (10 mg total) by mouth in the morning, at noon, in the evening, and at bedtime., Disp: 120 tablet, Rfl: 0   rosuvastatin  (CRESTOR ) 5 MG tablet, Take 5 mg by mouth daily with supper., Disp: , Rfl: 0   Semaglutide, 2 MG/DOSE, 8 MG/3ML SOPN, Inject into the skin., Disp: , Rfl:    spironolactone  (ALDACTONE ) 25 MG tablet, Take 0.5 tablets  (12.5 mg total) by mouth daily., Disp: 45 tablet, Rfl: 3  Current Facility-Administered Medications:    sodium chloride  flush (NS) 0.9 % injection 3 mL, 3 mL, Intravenous, Q12H, Furth, Cadence H, PA-C   Past Medical History:  Diagnosis Date   Allergy    dust, seasonal (worse in the fall).   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 Followed by Pain Specialist in [place], back and neck   Asthma    BRONCHITIS   Cataract    First Dx in 2012   Chronic combined systolic and diastolic congestive heart failure (HCC)    a. 03/2018 Echo: EF  30-35%; b. 07/2018 Echo: EF 35-40%; c. 09/2019 TEE: EF 40-45%; d. 12/2020 Echo: EF 35-40%; e.01/2022  Echo: EF 45-50%. sev apical HK; f. 01/2023 Echo: EF 40-45%, mod asymm basal-septal LVH, apical AK, nl RV fxn.   CKD (chronic kidney disease), stage II    Coronary artery disease    a. Prior Ant MI->s/p mult stents->LAD/RCA ([place]); b. 2016 Cath: nonobs dzs;  c. 04/2018 Cath/PCI: LCX 22m (3.25x15 Sierra DES); d. 02/2022 MV: high risk; e. 02/2022 Cath: LM nl, LAD 20p ISR, patent mid-stent, LCX patent stent, OM1 nl, OM2 50p, patent stent, OM3 40p, patent stent, RCA 40p ISR, 76m ISR, 73m/d, patent distal stent, RPDA patent stent, RPAV small, 80 (sl progression)-->Med Rx.   Deaf, left    Diabetes mellitus without complication (HCC)    TYPE 2   Diabetic peripheral neuropathy (HCC)    feet and hands   FUO (fever of unknown origin) 08/03/2018   GERD (gastroesophageal reflux disease)    Headache    muscle tension   Hyperlipidemia    Hypertension    Hyperthyroidism    Insomnia    Ischemic cardiomyopathy    a. 03/2018 Echo: EF 30-35%; b. 07/2018 Echo: EF 35-40%, Gr1 DD; c. 09/2019 TEE: EF 40-45%; d. 12/2020 Echo: EF 35-40%. GrI DD; e. 01/2022 Echo: EF 45-50%; f. 01/2023 Echo: EF 40-45%.   Knee pain, acute 05/06/2020   Left arm weakness 10/04/2019   Left leg weakness 12/01/2019   Lyme disease    Chronic   Myasthenia gravis (HCC)    (03/18/21 - no current treatment - "better than  it has ever been" per pt)   Myocardial infarction (HCC) 2010   Palpitations    a. 10/2020 Zio: RSR, 88 avg. 3 brief SVT episodes (max 5 beats @ 128). Rare PACs/PVCs. Triggered events did not correlate w/ significant arrthymia - some w/ sinus tach.   Seasonal allergies    Sepsis (HCC)    a.07/2018 - unknown source. TEE neg for veg 09/2019.   Sleep apnea    CPAP   Wears hearing aid in both ears    Assessment and Plan:  1. Chronic, continuous use of opioids   2. Chronic pain syndrome   3. Facet arthritis of lumbosacral region   4. Lumbar spondylosis with myelopathy   5. Sciatica, right side   6. Sciatica, left side   7. Left leg weakness   8. Low back pain at multiple sites   9. Lyme arthritis of multiple joints (HCC)   10. Cervicalgia   11. Degeneration of intervertebral disc of lumbar region with discogenic back pain and lower extremity pain    Based on conversation and after review of the [place]  practitioner database information think is appropriate to refill his medicines for the next 2 months dated from 821 and June 20.  No other changes in his pharmacologic regimen will be initiated.  I have refilled his cyclobenzaprine  10 mg tablets twice a day additionally.  Continue with stretching strengthening exercises and daily ambulation.  Continue with core strengthening as tolerated and continue follow-up with his primary care physicians with return to clinic in 2 months Follow Up Instructions:    I discussed the assessment and treatment plan with the patient. The patient was provided an opportunity to ask questions and all were answered. The patient agreed with the plan and demonstrated an understanding of the instructions.   The patient was advised to call back or seek an in-person evaluation if the symptoms worsen  or if the condition fails to improve as anticipated.  I provided 30 minutes of non-face-to-face time during this encounter.   Zula Hitch, MD

## 2024-01-31 NOTE — Progress Notes (Signed)
 Cardiology Office Note:  .   Date:  01/31/2024  ID:  [name], DOB [birth year], MRN [id number] PCP: Arleen Lacer, MD  [facility] Providers Cardiologist:  Antionette Kirks, MD     History of Present Illness: .   [name] is a 63 y.o. male with a hx of CAD status post multiple PCI's, ischemic cardiomyopathy, HFrEF, hypertension, orthostatic hypotension, hyperlipidemia, diabetes type 2, diabetic neuropathy, myasthenia gravis, sepsis, COPD, asthma, and OSA who presents for follow-up related to orthostasis.   Patient has extensive cardiac history.  He had a total of 10 stents placed in LAD and RCA starting in 2009 after presented with a myocardial infarction. Most recent heart cath in May 2023 showed patent stents with minimal in-stent restenosis and some progression of small vessel disease involving the RPA V.   Patient had worsening heart failure in June 2019.  Echo showed worsening LV systolic function with an EF of 30% with akinesis of the anterior, anterolateral and apical myocardium.  There was mild mitral regurgitation.  Cardiac cath showed patent stents in the LAD, OM's and RCA.  There was a new mid left circumflex stenosis which was stented.  EF was 25 to 30%.  Patient was started on GDT M with improvement of EF to 40 to 45%.   Patient was seen in November 2024 for orthostatic hypotension.  Lasix  was reduced to 20 mg daily and  Metoprolol  was discontinued.  Was noted creatinine was up to 1.6.  The patient was last seen 09/16/23 and was overall doing much better with his orthostatic symptoms.   Today,  The patient reports he is overall doing much better. He feels very tired after working outside. No further orthostasis, he is careful when he stands up. He denies chest pain. He has chronic SOB. He denies lower leg swelling.    Studies Reviewed: .        Echo 01/2023   1. Left ventricular ejection fraction, by estimation, is 40 to 45%. The  left ventricle has mild  to moderately decreased function. The left  ventricle demonstrates regional wall motion abnormalities (see scoring  diagram/findings for description). There is   moderate asymmetric left ventricular hypertrophy of the basal-septal  segment. Left ventricular diastolic parameters are indeterminate. There is  akinesis of the left ventricular, apical segment.   2. Right ventricular systolic function is normal. The right ventricular  size is normal.   3. The mitral valve is normal in structure. No evidence of mitral valve  regurgitation.   4. The aortic valve is tricuspid. Aortic valve regurgitation is not  visualized. Aortic valve sclerosis/calcification is present, without any  evidence of aortic stenosis.   5. There is mild dilatation of the aortic root, measuring 39 mm.   Comparison(s): 01/28/22 w/ Def. 45-50%, Asc ao 3.5.    LHC 02/2022     Prox LAD lesion is 20% stenosed.   Prox RCA lesion is 40% stenosed.   Mid RCA lesion is 20% stenosed.   Mid RCA to Dist RCA lesion is 40% stenosed.   Ost 2nd Mrg to 2nd Mrg lesion is 50% stenosed.   Ost 3rd Mrg lesion is 40% stenosed.   RPAV lesion is 80% stenosed.   Non-stenotic Mid LAD lesion was previously treated.   Non-stenotic Mid Cx lesion was previously treated.   Non-stenotic Dist RCA lesion was previously treated.   Non-stenotic 2nd Mrg lesion was previously treated.   Non-stenotic 3rd Mrg lesion was previously treated.  Non-stenotic Ost RPDA to RPDA lesion was previously treated.   There is mild to moderate left ventricular systolic dysfunction.   LV end diastolic pressure is mildly elevated.   The left ventricular ejection fraction is 35-45% by visual estimate.   1.  Widely patent LAD, left circumflex and RCA stents with no obstructive restenosis.  No significant change compared to most recent cardiac catheterization in 2019 with the exception of progression of stenosis in the right posterior AV groove supplying a small area. 2.   Mildly reduced LV systolic function and mildly elevated left ventricular end-diastolic pressure.   Recommendations: Continue aggressive medical therapy.   Echo 01/2022   1. Left ventricular ejection fraction, by estimation, is 45 to 50%. The  left ventricle has mildly decreased function. The left ventricle  demonstrates regional wall motion abnormalities (see scoring  diagram/findings for description). Left ventricular  diastolic parameters are indeterminate. There is severe hypokinesis of the  left ventricular, entire apical segment.   2. Right ventricular systolic function is normal. The right ventricular  size is normal.   3. The mitral valve is degenerative. No evidence of mitral valve  regurgitation.   4. The aortic valve is tricuspid. Aortic valve regurgitation is not  visualized. Aortic valve sclerosis is present, with no evidence of aortic  valve stenosis.   5. Aortic dilatation noted. There is mild dilatation of the aortic root,  measuring 39 mm.      Myoview  lexiscan  01/2022 Narrative & Impression      Findings are consistent with prior myocardial infarction with peri-infarct ischemia. The study is high risk.   No ST deviation was noted.   LV perfusion is abnormal. There is evidence of ischemia. There is evidence of infarction. Defect 1: There is a large defect with absent uptake present in the apical to mid inferior and inferolateral location(s) that is partially reversible. There is abnormal wall motion in the defect area. Consistent with peri-infarct ischemia. Defect 2: There is a medium defect with moderate reduction in uptake present in the apical to mid anterior location(s) that is partially reversible. There is abnormal wall motion in the defect area. Consistent with peri-infarct ischemia.   Left ventricular function is abnormal. The left ventricular ejection fraction is moderately decreased (30-44%). End diastolic cavity size is normal. End systolic cavity size is  normal.        Physical Exam:   VS:  There were no vitals taken for this visit.   Wt Readings from Last 3 Encounters:  12/22/23 205 lb (93 kg)  12/02/23 214 lb 6.4 oz (97.3 kg)  09/21/23 205 lb (93 kg)    GEN: Well nourished, well developed in no acute distress NECK: No JVD; No carotid bruits CARDIAC: RRR, no murmurs, rubs, gallops RESPIRATORY:  Clear to auscultation without rales, wheezing or rhonchi  ABDOMEN: Soft, non-tender, non-distended EXTREMITIES:  No edema; No deformity   ASSESSMENT AND PLAN: .    Orthostatic hypotension H/o HTN The patient denies significant symptoms. He gets his balance every time he stands up before he starts walking. BP today is 132/71. Continue spironolactone  12.5mg  daily.   CKD stage 2-3 Scr/BUN improved on most recent check.   CAD s/p multiple stents Cardiac cath in 2023 showed patent stents with minimal ISR and some progression of small vessel disease involving the RPA V. Patient denies chest pain. He has chronic SOB that is unchanged. Continue ASA, PLAvix , statin and Zetia . NO BB 2/2 orthostasis.   Chronic HFmrEF Echo 01/2023 showed  LVEF 40-45%, mod LVH. GDMT limited by orthostasis. He is euvolemic. Continue lasix  20mg  daily, Jardiance  10mg  daily and spiro 12.5mg  daily.   HLD LDL 46. Continue Crestor  5mg  daily and Zetia  10mg  daily.        Dispo: Follow-up in 6 months  Signed, Chinmayi Rumer Rebekah Canada, PA-C

## 2024-02-01 ENCOUNTER — Ambulatory Visit: Admitting: Physical Therapy

## 2024-02-02 ENCOUNTER — Ambulatory Visit: Payer: Medicare Other | Admitting: Physical Therapy

## 2024-02-03 ENCOUNTER — Ambulatory Visit: Admitting: Physical Therapy

## 2024-02-04 ENCOUNTER — Ambulatory Visit: Payer: Medicare Other | Admitting: Physical Therapy

## 2024-02-08 ENCOUNTER — Ambulatory Visit: Admitting: Licensed Clinical Social Worker

## 2024-02-09 ENCOUNTER — Ambulatory Visit: Payer: Medicare Other | Admitting: Physical Therapy

## 2024-02-11 ENCOUNTER — Ambulatory Visit: Payer: Medicare Other | Admitting: Physical Therapy

## 2024-02-14 DIAGNOSIS — E1165 Type 2 diabetes mellitus with hyperglycemia: Secondary | ICD-10-CM | POA: Diagnosis not present

## 2024-02-15 ENCOUNTER — Ambulatory Visit: Admitting: Physical Therapy

## 2024-02-16 ENCOUNTER — Ambulatory Visit: Payer: Medicare Other | Admitting: Physical Therapy

## 2024-02-17 ENCOUNTER — Ambulatory Visit: Admitting: Physical Therapy

## 2024-02-17 ENCOUNTER — Encounter: Payer: Self-pay | Admitting: Oncology

## 2024-02-18 ENCOUNTER — Ambulatory Visit: Payer: Medicare Other

## 2024-02-22 ENCOUNTER — Ambulatory Visit: Admitting: Physical Therapy

## 2024-02-23 ENCOUNTER — Ambulatory Visit: Admitting: Licensed Clinical Social Worker

## 2024-02-24 ENCOUNTER — Ambulatory Visit: Admitting: Physical Therapy

## 2024-02-24 ENCOUNTER — Ambulatory Visit: Admitting: Podiatry

## 2024-02-24 ENCOUNTER — Encounter: Payer: Self-pay | Admitting: Podiatry

## 2024-02-24 ENCOUNTER — Other Ambulatory Visit: Payer: Self-pay | Admitting: Cardiovascular Disease

## 2024-02-24 DIAGNOSIS — E1142 Type 2 diabetes mellitus with diabetic polyneuropathy: Secondary | ICD-10-CM | POA: Diagnosis not present

## 2024-02-24 DIAGNOSIS — B351 Tinea unguium: Secondary | ICD-10-CM

## 2024-02-24 DIAGNOSIS — M79675 Pain in left toe(s): Secondary | ICD-10-CM | POA: Diagnosis not present

## 2024-02-24 DIAGNOSIS — M79674 Pain in right toe(s): Secondary | ICD-10-CM

## 2024-02-24 NOTE — Progress Notes (Unsigned)
  Subjective:  Patient ID: [name], male    DOB: 1961/02/22,  MRN: [id number]  [name] presents to clinic today for {jgcomplaint:23593} No chief complaint on file.  New problem(s): None. {jgcomplaint:23593}  PCP is Sowles, Krichna, MD.  Allergies  Allergen Reactions   Azathioprine Other (See Comments)    Azathioprine hypersensitivity reaction - Symptoms mimicking sepsis - was hospitalized   Novolog  [Insulin  Aspart (Human Analog)] Hives    Review of Systems: Negative except as noted in the HPI.  Objective: No changes noted in today's physical examination. There were no vitals filed for this visit. [name] is a pleasant 63 y.o. male {jgbodyhabitus:24098} AAO x 3.  Vascular Examination: Capillary refill time immediate b/l. Vascular status intact b/l with palpable pedal pulses. Pedal hair present b/l. No pain with calf compression b/l. Skin temperature gradient WNL b/l. No cyanosis or clubbing b/l. No ischemia or gangrene noted b/l. {jgvascular:23595}  Neurological Examination: Sensation grossly intact b/l with 10 gram monofilament. Vibratory sensation intact b/l. {jgneuro:23601::"Protective sensation intact 5/5 intact bilaterally with 10g monofilament b/l.","Vibratory sensation intact b/l.","Proprioception intact bilaterally."}  Dermatological Examination: Pedal skin with normal turgor, texture and tone b/l.  No open wounds. No interdigital macerations.   Toenails 1-5 b/l thick, discolored, elongated with subungual debris and pain on dorsal palpation.   {jgderm:23598}  Musculoskeletal Examination: {jgmsk:23600}  Radiographs: None  {jgxrayfindings:23683}  Last A1c:       No data to display          Assessment/Plan: No diagnosis found.  No orders of the defined types were placed in this encounter.   None {Jgplan:23602::"-Patient/POA to call should there be question/concern in the interim."}   Return in about 3 months (around  05/26/2024).  Luella Sager, DPM      [place] LOCATION: [address] [place] [address]                   Office [phone]   [facility] LOCATION: [address] [place], [place] [address] Office [phone]

## 2024-02-26 ENCOUNTER — Other Ambulatory Visit: Payer: Self-pay | Admitting: Family Medicine

## 2024-02-26 DIAGNOSIS — E063 Autoimmune thyroiditis: Secondary | ICD-10-CM

## 2024-02-29 ENCOUNTER — Encounter: Payer: Self-pay | Admitting: Podiatry

## 2024-02-29 ENCOUNTER — Ambulatory Visit: Admitting: Physical Therapy

## 2024-02-29 NOTE — Progress Notes (Incomplete)
  Subjective:  Patient ID: [name], male    DOB: [birth year],  MRN: [id number]  [name] presents to clinic today for {jgcomplaint:23593}  Chief Complaint  Patient presents with  . Diabetes    "Get my nails clipped and get an examination."  Dr. Shelvy Dickens - 11/01/2023; A1c - 7.9   New problem(s): None. {jgcomplaint:23593}  PCP is Arleen Lacer, MD.  Allergies  Allergen Reactions  . Azathioprine Other (See Comments)    Azathioprine hypersensitivity reaction - Symptoms mimicking sepsis - was hospitalized  . Novolog  [Insulin  Aspart (Human Analog)] Hives    Review of Systems: Negative except as noted in the HPI.  Objective: No changes noted in today's physical examination. There were no vitals filed for this visit. [name] is a pleasant 63 y.o. male {jgbodyhabitus:24098} AAO x 3.  Vascular Examination: Capillary refill time immediate b/l. Vascular status intact b/l with palpable pedal pulses. Pedal hair present b/l. No pain with calf compression b/l. Skin temperature gradient WNL b/l. No cyanosis or clubbing b/l. No ischemia or gangrene noted b/l. {jgvascular:23595}  Neurological Examination: Sensation grossly intact b/l with 10 gram monofilament. Vibratory sensation intact b/l. {jgneuro:23601::"Protective sensation intact 5/5 intact bilaterally with 10g monofilament b/l.","Vibratory sensation intact b/l.","Proprioception intact bilaterally."}  Dermatological Examination: Pedal skin with normal turgor, texture and tone b/l.  No open wounds. No interdigital macerations.   Toenails 1-5 b/l thick, discolored, elongated with subungual debris and pain on dorsal palpation.   {jgderm:23598}  Musculoskeletal Examination: {jgmsk:23600}  Radiographs: None  {jgxrayfindings:23683}  Last A1c:       No data to display          Assessment/Plan: 1. Pain due to onychomycosis of toenails of both feet   2. Diabetic peripheral neuropathy (HCC)     No orders  of the defined types were placed in this encounter.   None {Jgplan:23602::"-Patient/POA to call should there be question/concern in the interim."}   Return in about 3 months (around 05/26/2024).  Luella Sager, DPM      [place] LOCATION: [address] [place] [address]                   Office [phone]   [facility] LOCATION: [address] [place], [place] [address] Office [phone]

## 2024-03-02 ENCOUNTER — Ambulatory Visit: Admitting: Physical Therapy

## 2024-03-06 DIAGNOSIS — Z794 Long term (current) use of insulin: Secondary | ICD-10-CM | POA: Diagnosis not present

## 2024-03-06 DIAGNOSIS — E1142 Type 2 diabetes mellitus with diabetic polyneuropathy: Secondary | ICD-10-CM | POA: Diagnosis not present

## 2024-03-06 LAB — HEMOGLOBIN A1C: Hemoglobin A1C: 7

## 2024-03-07 ENCOUNTER — Ambulatory Visit: Admitting: Physical Therapy

## 2024-03-08 DIAGNOSIS — E785 Hyperlipidemia, unspecified: Secondary | ICD-10-CM | POA: Diagnosis not present

## 2024-03-08 DIAGNOSIS — E063 Autoimmune thyroiditis: Secondary | ICD-10-CM | POA: Diagnosis not present

## 2024-03-08 DIAGNOSIS — E1169 Type 2 diabetes mellitus with other specified complication: Secondary | ICD-10-CM | POA: Diagnosis not present

## 2024-03-08 DIAGNOSIS — I152 Hypertension secondary to endocrine disorders: Secondary | ICD-10-CM | POA: Diagnosis not present

## 2024-03-08 DIAGNOSIS — E1165 Type 2 diabetes mellitus with hyperglycemia: Secondary | ICD-10-CM | POA: Diagnosis not present

## 2024-03-08 DIAGNOSIS — E1159 Type 2 diabetes mellitus with other circulatory complications: Secondary | ICD-10-CM | POA: Diagnosis not present

## 2024-03-08 DIAGNOSIS — Z794 Long term (current) use of insulin: Secondary | ICD-10-CM | POA: Diagnosis not present

## 2024-03-08 LAB — HM DIABETES FOOT EXAM

## 2024-03-09 ENCOUNTER — Encounter: Payer: Self-pay | Admitting: Licensed Clinical Social Worker

## 2024-03-09 ENCOUNTER — Ambulatory Visit: Admitting: Physical Therapy

## 2024-03-10 DIAGNOSIS — E1165 Type 2 diabetes mellitus with hyperglycemia: Secondary | ICD-10-CM | POA: Diagnosis not present

## 2024-03-14 ENCOUNTER — Ambulatory Visit: Admitting: Physical Therapy

## 2024-03-14 ENCOUNTER — Ambulatory Visit: Admitting: Licensed Clinical Social Worker

## 2024-03-14 DIAGNOSIS — F439 Reaction to severe stress, unspecified: Secondary | ICD-10-CM

## 2024-03-14 DIAGNOSIS — F3342 Major depressive disorder, recurrent, in full remission: Secondary | ICD-10-CM

## 2024-03-14 NOTE — Progress Notes (Signed)
 THERAPIST PROGRESS NOTE  Session Time: 11-11:40pm  Participation Level: Active  Behavioral Response: CasualAlertEuthymic  Type of Therapy: Individual Therapy  Treatment Goals addressed:  Problem: BH CCP Acute or Chronic Trauma Reaction      Dates: Start:  01/11/24        Disciplines: Interdisciplinary, PROVIDER                 Goal: LTG: Elimination of maladaptive behaviors and thinking patterns which interfere with resolution of trauma as evidenced by self report     Dates: Start:  01/11/24    Expected End:  04/11/24       Disciplines: Interdisciplinary, PROVIDER                     Goal: LTG: Develop and implement effective coping skills to carry out normal responsibilities and participate constructively in relationships as evidenced by self report     Dates: Start:  01/11/24    Expected End:  04/11/24       Disciplines: Interdisciplinary, PROVIDER                     Goal: LTG: Recall traumatic events without becoming overwhelmed with negative emotions     Dates: Start:  01/11/24    Expected End:  04/11/24       Disciplines: Interdisciplinary, PROVIDER                     Goal: STG: Edgar "[name]" will identify coping strategies to deal with trauma memories and the associated emotional reaction     Dates: Start:  01/11/24    Expected End:  04/11/24       Disciplines: Interdisciplinary, PROVIDER                     Goal: STG: Edgar "[name]" will cooperate with treatment in an effort to reduce PHQ-9 assessment scores     Dates: Start:  01/11/24    Expected End:  04/11/24       Disciplines: Interdisciplinary, PROVIDER      ProgressTowards Goals: Completed  Interventions: Supportive, strength focused   Summary: [name] is a 63 y.o. male who presents with symptoms of anxiety. Patient reports symptoms to include uncontrollable worry, anxious feelings, avoidance, tension, restlessness. Pt was oriented times 5. Pt was cooperative and engaged. Pt denies  SI/HI/AVH.   Patient reported at the top of the session that due to cessation of insurance coverage, he will have to drop out of therapeutic services.  However, patient reports marked improvement with his depression and anxiety citing success to therapeutic services and closure of legal proceedings.  Patient reports he has been prioritizing his overall mental wellbeing by practicing mindfulness, prioritizing connecting with family, and taking care of his physical health.  Patient reflected on complications regarding his heart failure and action steps he is making to address his physical wellbeing.  He identifies despite declining physical health, he is maintaining a positive outlook.  Clinician and patient reflected on goals addressed in therapy and patient was informed should he need to return to therapeutic services he may always contact [employer].  Suicidal/Homicidal: Nowithout intent/plan  Therapist Response: Cln utilized active and supportive reflection to create safe environment for patient to process recent life stressors. Clinician assessed for current symptoms, stressors, safety since last session.  Reflected on patient's progress in therapy as well as steps he is making towards bettering his physical health.  Plan: Patient will be terminating therapeutic services as a result of insurance coverage.   Diagnosis: Trauma and stressor-related disorder  MDD (major depressive disorder), recurrent, in full remission (HCC)    Collaboration of Care: AEB psychiatrist can access notes and cln. Will review psychiatrists' notes. Check in with the patient and will see LCSW per availability. Patient agreed with treatment recommendations.   Patient/Guardian was advised Release of Information must be obtained prior to any record release in order to collaborate their care with an outside provider. Patient/Guardian was advised if they have not already done so to contact the registration department to sign all  necessary forms in order for us  to release information regarding their care.   Consent: Patient/Guardian gives verbal consent for treatment and assignment of benefits for services provided during this visit. Patient/Guardian expressed understanding and agreed to proceed.   Marvin Slot, LCSW 03/14/2024

## 2024-03-16 ENCOUNTER — Ambulatory Visit: Admitting: Physical Therapy

## 2024-03-17 ENCOUNTER — Encounter: Payer: Self-pay | Admitting: Family Medicine

## 2024-03-17 ENCOUNTER — Ambulatory Visit (INDEPENDENT_AMBULATORY_CARE_PROVIDER_SITE_OTHER): Admitting: Family Medicine

## 2024-03-17 VITALS — BP 102/60 | HR 99 | Resp 16 | Ht 67.0 in | Wt 214.7 lb

## 2024-03-17 DIAGNOSIS — J208 Acute bronchitis due to other specified organisms: Secondary | ICD-10-CM

## 2024-03-17 DIAGNOSIS — R058 Other specified cough: Secondary | ICD-10-CM

## 2024-03-17 DIAGNOSIS — J4551 Severe persistent asthma with (acute) exacerbation: Secondary | ICD-10-CM | POA: Diagnosis not present

## 2024-03-17 DIAGNOSIS — B9689 Other specified bacterial agents as the cause of diseases classified elsewhere: Secondary | ICD-10-CM

## 2024-03-17 DIAGNOSIS — J4550 Severe persistent asthma, uncomplicated: Secondary | ICD-10-CM | POA: Insufficient documentation

## 2024-03-17 MED ORDER — AMOXICILLIN-POT CLAVULANATE 875-125 MG PO TABS
1.0000 | ORAL_TABLET | Freq: Two times a day (BID) | ORAL | 0 refills | Status: DC
Start: 2024-03-17 — End: 2024-04-27

## 2024-03-17 MED ORDER — TRELEGY ELLIPTA 200-62.5-25 MCG/ACT IN AEPB: 1.0000 | INHALATION_SPRAY | Freq: Every day | RESPIRATORY_TRACT | 0 refills | Status: AC

## 2024-03-17 MED ORDER — BENZONATATE 100 MG PO CAPS: 100.0000 mg | ORAL_CAPSULE | Freq: Three times a day (TID) | ORAL | 0 refills | Status: DC | PRN

## 2024-03-17 MED ORDER — PREDNISONE 20 MG PO TABS: 20.0000 mg | ORAL_TABLET | Freq: Two times a day (BID) | ORAL | 0 refills | Status: AC

## 2024-03-17 NOTE — Progress Notes (Addendum)
 Name: [name]   MRN: [id number]    DOB: 06-Sep-1961   Date:03/17/2024       Progress Note  Subjective  Chief Complaint  Chief Complaint  Patient presents with   Cough    On going for a month, now in chest   Nasal Congestion    Green mucus    HPI    Asthma moderate persistent poorly controlled: last time treated with antibiotics - Augmentin  and prednisone  by Dr. Jamal Mays back in January. He states over the past month symptoms are getting worse again, more wheezing, sob with activity and cough that is worse at night, and productive with a greenish and brown sputum. No fever or chills. He has some nasal congestion but stable for him. Normal appetite.    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) 12/02/2021   Gastropathy 08/13/2021   Iron  deficiency anemia due to chronic blood loss    Pain due to onychomycosis of toenails of both feet 11/11/2020   Lung involvement associated with another disorder (HCC) 05/17/2020   Lower urinary tract symptoms 09/15/2019   Incomplete bladder emptying 09/15/2019   Carpal tunnel syndrome on both sides 07/12/2019   Idiopathic peripheral neuropathy 04/21/2019   Sensory ataxia 03/09/2019   OSA on CPAP 07/08/2018   Congestive heart failure (CHF) (HCC) 05/05/2018   Unstable angina (HCC) 04/26/2018   Ischemic cardiomyopathy 04/26/2018   Chronic combined systolic and diastolic CHF (congestive heart failure) (HCC) 04/26/2018   Anginal equivalent (HCC) 04/25/2018   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HCC) 01/25/2018   Vitamin D  deficiency,  unspecified 08/12/2017   Ptosis of left eyelid 01/08/2017   Dermatitis 12/24/2016   Benign neoplasm of sigmoid colon    Benign neoplasm of descending colon    Benign neoplasm of transverse colon    Coronary artery disease involving native coronary artery with angina pectoris (HCC) 10/22/2015   Coronary artery disease 08/22/2015   Myasthenia gravis (HCC) 08/22/2015   Chronic left-sided low back pain 08/22/2015   Hypertension 08/22/2015   GERD (gastroesophageal reflux disease) 08/22/2015   Hyperlipidemia 08/22/2015   Insomnia 08/22/2015   Type 2 diabetes mellitus with diabetic polyneuropathy, with long-term current use of insulin  (HCC) 08/22/2015   Asthma 08/22/2015   Lyme disease 06/05/1992    Social History   Tobacco Use   Smoking status: Never   Smokeless tobacco: Never   Tobacco comments:    smoking cessation materials not required  Substance Use Topics   Alcohol use: Not Currently    Alcohol/week: 0.0 standard drinks of alcohol     Current Outpatient Medications:    albuterol  (PROVENTIL ) (2.5 MG/3ML) 0.083% nebulizer solution, Take 3 mLs (2.5 mg total) by nebulization every 6 (six) hours as needed for wheezing or shortness of breath., Disp: 75 mL, Rfl: 2   albuterol  (VENTOLIN  HFA) 108 (90 Base) MCG/ACT inhaler, Inhale 2 puffs into the lungs daily., Disp: , Rfl:    ammonium lactate  (AMLACTIN) 12 % lotion, Apply 1 Application topically as needed for dry skin., Disp: 400 g, Rfl: 3   aspirin  EC 81 MG tablet, Take 1 tablet (81 mg total) by mouth daily.,  Disp: 90 tablet, Rfl: 3   BD PEN NEEDLE NANO 2ND GEN 32G X 4 MM MISC, USE AS DIRECTED 5 TO 6 TIMES DAILY DEPENDING ON MEALS AND BLOOD SUGAR, Disp: , Rfl:    clopidogrel  (PLAVIX ) 75 MG tablet, TAKE 1 TABLET BY MOUTH DAILY WITH BREAKFAST, Disp: 90 tablet, Rfl: 3   cyclobenzaprine  (FLEXERIL ) 10 MG tablet, Take 1 tablet (10 mg total) by mouth 2 (two) times daily., Disp: 60 tablet, Rfl: 3   DULoxetine  (CYMBALTA ) 30 MG capsule, Take 1  capsule (30 mg total) by mouth daily., Disp: 90 capsule, Rfl: 1   DULoxetine  (CYMBALTA ) 60 MG capsule, Take 1 capsule (60 mg total) by mouth daily., Disp: 90 capsule, Rfl: 1   ezetimibe  (ZETIA ) 10 MG tablet, TAKE 1 TABLET(10 MG) BY MOUTH DAILY, Disp: 90 tablet, Rfl: 0   fluticasone  furoate-vilanterol (BREO ELLIPTA ) 200-25 MCG/ACT AEPB, Inhale 1 puff into the lungs daily., Disp: , Rfl:    furosemide  (LASIX ) 40 MG tablet, Take 1 tablet (40 mg total) by mouth daily., Disp: 90 tablet, Rfl: 3   gabapentin  (NEURONTIN ) 300 MG capsule, Take 1 capsule (300 mg total) by mouth 3 (three) times daily., Disp: 90 capsule, Rfl: 1   gabapentin  (NEURONTIN ) 800 MG tablet, Take 800 mg by mouth at bedtime., Disp: , Rfl:    insulin  lispro (HUMALOG ) 100 UNIT/ML KiwkPen, Inject 6-10 Units into the skin 3 (three) times daily.  sliding scale 1 unit for every 10 units of carbs, Disp: , Rfl:    JARDIANCE  10 MG TABS tablet, TAKE 1 TABLET(10 MG) BY MOUTH DAILY BEFORE BREAKFAST, Disp: 90 tablet, Rfl: 3   ketoconazole  (NIZORAL ) 2 % cream, Apply 1 Application topically daily., Disp: 60 g, Rfl: 0   levocetirizine (XYZAL ) 5 MG tablet, Take 1 tablet (5 mg total) by mouth daily at 12 noon., Disp: 90 tablet, Rfl: 1   levothyroxine  (SYNTHROID ) 50 MCG tablet, TAKE 1 TABLET BY MOUTH EVERY DAY ON MONDAY TO SATURDAY AND 2 TABLETS ON SUNDAY, Disp: 102 tablet, Rfl: 0   lubiprostone  (AMITIZA ) 8 MCG capsule, Take 1 capsule (8 mcg total) by mouth 2 (two) times daily with a meal., Disp: 180 capsule, Rfl: 1   metFORMIN  (GLUCOPHAGE ) 1000 MG tablet, Take 1,000 mg by mouth 2 (two) times daily with a meal., Disp: , Rfl:    montelukast  (SINGULAIR ) 10 MG tablet, Take 10 mg by mouth at bedtime., Disp: , Rfl:    Multiple Vitamins-Minerals (MENS MULTI VITAMIN & MINERAL PO), Take 1 tablet by mouth in the morning and at bedtime., Disp: , Rfl:    naloxone  (NARCAN ) nasal spray 4 mg/0.1 mL, For excess sedation from opioids, Disp: 1 kit, Rfl: 2   nitroGLYCERIN   (NITROSTAT ) 0.3 MG SL tablet, DISSOLVE 1 TABLET UNDER THE TONGUE EVERY 5 MINUTES AS NEEDED FOR CHEST PAIN, MAX 3 DOSES, Disp: 25 tablet, Rfl: 0   Oxycodone  HCl 10 MG TABS, Take 1 tablet (10 mg total) by mouth in the morning, at noon, in the evening, and at bedtime., Disp: 120 tablet, Rfl: 0   [START ON 03/24/2024] Oxycodone  HCl 10 MG TABS, Take 1 tablet (10 mg total) by mouth in the morning, at noon, in the evening, and at bedtime., Disp: 120 tablet, Rfl: 0   rosuvastatin  (CRESTOR ) 5 MG tablet, Take 5 mg by mouth daily with supper., Disp: , Rfl: 0   Semaglutide, 2 MG/DOSE, 8 MG/3ML SOPN, Inject into the skin., Disp: , Rfl:    spironolactone  (ALDACTONE ) 25 MG tablet, Take 0.5  tablets (12.5 mg total) by mouth daily., Disp: 45 tablet, Rfl: 3   dexlansoprazole  (DEXILANT ) 60 MG capsule, Take 1 capsule (60 mg total) by mouth daily. (Patient not taking: Reported on 03/17/2024), Disp: 30 capsule, Rfl: 2   LANTUS  SOLOSTAR 100 UNIT/ML Solostar Pen, Inject 20-23 Units into the skin daily at 10 pm. Sliding scale based on blood glucose (Patient not taking: Reported on 03/17/2024), Disp: , Rfl: 0  Current Facility-Administered Medications:    sodium chloride  flush (NS) 0.9 % injection 3 mL, 3 mL, Intravenous, Q12H, Furth, Cadence H, PA-C  Allergies  Allergen Reactions   Azathioprine Other (See Comments)    Azathioprine hypersensitivity reaction - Symptoms mimicking sepsis - was hospitalized   Novolog  [Insulin  Aspart (Human Analog)] Hives    ROS  Ten systems reviewed and is negative except as mentioned in HPI    Objective  Vitals:   03/17/24 0911  BP: 102/60  Pulse: 99  Resp: 16  SpO2: 98%  Weight: 214 lb 11.2 oz (97.4 kg)  Height: 5' 7 (1.702 m)    Body mass index is 33.63 kg/m.  Physical Exam  Constitutional: Patient appears well-developed and well-nourished. Obese  No distress.  HEENT: head atraumatic, normocephalic, pupils equal and reactive to light, ears normal TM , neck supple, throat  within normal limits. Left ptosis - stable  Cardiovascular: Normal rate, regular rhythm and normal heart sounds.  No murmur heard. No BLE edema. Pulmonary/Chest: Effort normal , bilateral wheezing, rhonchi noticed .  No respiratory distress. Abdominal: Soft.  There is no tenderness. Psychiatric: Patient has a normal mood and affect. behavior is normal. Judgment and thought content normal.   Recent Results (from the past 2160 hours)  Basic metabolic panel     Status: Abnormal   Collection Time: 12/29/23 12:34 PM  Result Value Ref Range   Glucose 107 (H) 70 - 99 mg/dL   BUN 32 (H) 8 - 27 mg/dL   Creatinine, Ser 1.91 (H) 0.76 - 1.27 mg/dL   eGFR 57 (L) >47 WG/NFA/2.13   BUN/Creatinine Ratio 23 10 - 24   Sodium 140 134 - 144 mmol/L   Potassium 4.8 3.5 - 5.2 mmol/L   Chloride 98 96 - 106 mmol/L   CO2 25 20 - 29 mmol/L   Calcium  10.0 8.6 - 10.2 mg/dL  HM DIABETES EYE EXAM     Status: Abnormal   Collection Time: 01/05/24  1:45 PM  Result Value Ref Range   [facility] Diabetic Eye Exam Retinopathy (A) No Retinopathy    Comment: Abstracted by HIM     Assessment & Plan  1. Severe Persistent Asthma with acute exacerbation   - Fluticasone -Umeclidin-Vilant (TRELEGY ELLIPTA ) 200-62.5-25 MCG/ACT AEPB; Inhale 1 puff into the lungs daily for 14 days. In place of Breo and stop after the month  Dispense: 28 each; Refill: 0  2. Productive cough  - DG Chest 2 View; Future  3. Acute bacterial bronchitis  - Fluticasone -Umeclidin-Vilant (TRELEGY ELLIPTA ) 200-62.5-25 MCG/ACT AEPB; Inhale 1 puff into the lungs daily for 14 days. In place of Breo and stop after the month  Dispense: 28 each; Refill: 0 - amoxicillin -clavulanate (AUGMENTIN ) 875-125 MG tablet; Take 1 tablet by mouth 2 (two) times daily.  Dispense: 20 tablet; Refill: 0 - predniSONE  (DELTASONE ) 20 MG tablet; Take 1 tablet (20 mg total) by mouth 2 (two) times daily with a meal for 5 days.  Dispense: 10 tablet; Refill: 0 - benzonatate  (TESSALON ) 100  MG capsule; Take 1-2 capsules (100-200 mg total)  by mouth 3 (three) times daily as needed for cough.  Dispense: 40 capsule; Refill: 0

## 2024-03-21 ENCOUNTER — Ambulatory Visit: Admitting: Physical Therapy

## 2024-03-23 ENCOUNTER — Ambulatory Visit: Admitting: Physical Therapy

## 2024-03-27 ENCOUNTER — Other Ambulatory Visit: Payer: Self-pay | Admitting: Family Medicine

## 2024-03-27 DIAGNOSIS — F331 Major depressive disorder, recurrent, moderate: Secondary | ICD-10-CM

## 2024-03-28 ENCOUNTER — Ambulatory Visit: Admitting: Licensed Clinical Social Worker

## 2024-04-10 DIAGNOSIS — E1165 Type 2 diabetes mellitus with hyperglycemia: Secondary | ICD-10-CM | POA: Diagnosis not present

## 2024-04-19 ENCOUNTER — Encounter: Payer: Self-pay | Admitting: Anesthesiology

## 2024-04-19 ENCOUNTER — Ambulatory Visit: Attending: Anesthesiology | Admitting: Anesthesiology

## 2024-04-19 DIAGNOSIS — M51362 Other intervertebral disc degeneration, lumbar region with discogenic back pain and lower extremity pain: Secondary | ICD-10-CM | POA: Diagnosis not present

## 2024-04-19 DIAGNOSIS — A6923 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: Secondary | ICD-10-CM | POA: Diagnosis not present

## 2024-04-19 DIAGNOSIS — Z79891 Long term (current) use of opiate analgesic: Secondary | ICD-10-CM | POA: Diagnosis not present

## 2024-04-19 DIAGNOSIS — G894 Chronic pain syndrome: Secondary | ICD-10-CM | POA: Diagnosis not present

## 2024-04-19 DIAGNOSIS — M5136 Other intervertebral disc degeneration, lumbar region with discogenic back pain only: Secondary | ICD-10-CM

## 2024-04-19 DIAGNOSIS — M5432 Sciatica, left side: Secondary | ICD-10-CM | POA: Diagnosis not present

## 2024-04-19 DIAGNOSIS — M545 Low back pain, unspecified: Secondary | ICD-10-CM

## 2024-04-19 DIAGNOSIS — M4716 Other spondylosis with myelopathy, lumbar region: Secondary | ICD-10-CM | POA: Diagnosis not present

## 2024-04-19 DIAGNOSIS — F1190 Opioid use, unspecified, uncomplicated: Secondary | ICD-10-CM

## 2024-04-19 DIAGNOSIS — M47817 Spondylosis without myelopathy or radiculopathy, lumbosacral region: Secondary | ICD-10-CM

## 2024-04-19 DIAGNOSIS — M5431 Sciatica, right side: Secondary | ICD-10-CM

## 2024-04-19 DIAGNOSIS — M542 Cervicalgia: Secondary | ICD-10-CM | POA: Diagnosis not present

## 2024-04-19 DIAGNOSIS — R29898 Other symptoms and signs involving the musculoskeletal system: Secondary | ICD-10-CM | POA: Diagnosis not present

## 2024-04-19 MED ORDER — OXYCODONE HCL 10 MG PO TABS: 10.0000 mg | ORAL_TABLET | Freq: Four times a day (QID) | ORAL | 0 refills | Status: AC

## 2024-04-19 MED ORDER — OXYCODONE HCL 10 MG PO TABS: 10.0000 mg | ORAL_TABLET | Freq: Four times a day (QID) | ORAL | 0 refills | Status: DC

## 2024-04-19 NOTE — Progress Notes (Signed)
 Virtual Visit via Telephone Note  I connected with Edgar HINDE on 04/19/24 at  4:40 PM EDT by telephone and verified that I am speaking with the correct person using two identifiers.  Location: Patient: Home Provider: [facility]   I discussed the limitations, risks, security and privacy concerns of performing an evaluation and management service by telephone and the availability of in person appointments. I also discussed with the patient that there may be a patient responsible charge related to this service. The patient expressed understanding and agreed to proceed.   History of Present Illness: I spoke with [name] via telephone as we could not leave for the video portion of the conference.  He reports that he is getting some recurrence of his left lower extremity sciatica symptoms similar to what he has had in the past.  He has responded favorably to epidurals historically and generally has about 2 or 3 of these per year.  His last injection in March helped significantly with the right side lower leg pain which is where we primarily directed.  The December injection helped significantly with the left side sciatica.  He generally gets about 80% relief knocking his pain score down from a 9 or 10 down to a 4 or 5.  He has about 75% improvement in his low back pain and almost 80 to 90% improvement in the sciatica lasting about 2 to 3 months before his recurrence of the baseline pain.  The quality characteristic and distribution of the same without significant change and he is responding favorably to the oxycodone  which he takes 4 times a day.  No side effects reported.  Review of systems: General: No fevers or chills Pulmonary: No shortness of breath or dyspnea Cardiac: No angina or palpitations or lightheadedness GI: No abdominal pain or constipation Psych: No depression    Observations/Objective:  Current Outpatient Medications:    [START ON 05/23/2024] Oxycodone  HCl 10 MG  TABS, Take 1 tablet (10 mg total) by mouth in the morning, at noon, in the evening, and at bedtime., Disp: 120 tablet, Rfl: 0   albuterol  (PROVENTIL ) (2.5 MG/3ML) 0.083% nebulizer solution, Take 3 mLs (2.5 mg total) by nebulization every 6 (six) hours as needed for wheezing or shortness of breath., Disp: 75 mL, Rfl: 2   albuterol  (VENTOLIN  HFA) 108 (90 Base) MCG/ACT inhaler, Inhale 2 puffs into the lungs daily., Disp: , Rfl:    ammonium lactate  (AMLACTIN) 12 % lotion, Apply 1 Application topically as needed for dry skin., Disp: 400 g, Rfl: 3   amoxicillin -clavulanate (AUGMENTIN ) 875-125 MG tablet, Take 1 tablet by mouth 2 (two) times daily., Disp: 20 tablet, Rfl: 0   aspirin  EC 81 MG tablet, Take 1 tablet (81 mg total) by mouth daily., Disp: 90 tablet, Rfl: 3   benzonatate  (TESSALON ) 100 MG capsule, Take 1-2 capsules (100-200 mg total) by mouth 3 (three) times daily as needed for cough., Disp: 40 capsule, Rfl: 0   clopidogrel  (PLAVIX ) 75 MG tablet, TAKE 1 TABLET BY MOUTH DAILY WITH BREAKFAST, Disp: 90 tablet, Rfl: 3   cyclobenzaprine  (FLEXERIL ) 10 MG tablet, Take 1 tablet (10 mg total) by mouth 2 (two) times daily., Disp: 60 tablet, Rfl: 3   DULoxetine  (CYMBALTA ) 30 MG capsule, Take 1 capsule (30 mg total) by mouth daily., Disp: 90 capsule, Rfl: 1   DULoxetine  (CYMBALTA ) 60 MG capsule, Take 1 capsule (60 mg total) by mouth daily., Disp: 90 capsule, Rfl: 1   ezetimibe  (ZETIA ) 10 MG tablet,  TAKE 1 TABLET(10 MG) BY MOUTH DAILY, Disp: 90 tablet, Rfl: 0   fluticasone  furoate-vilanterol (BREO ELLIPTA ) 200-25 MCG/ACT AEPB, Inhale 1 puff into the lungs daily., Disp: , Rfl:    furosemide  (LASIX ) 40 MG tablet, Take 1 tablet (40 mg total) by mouth daily., Disp: 90 tablet, Rfl: 3   gabapentin  (NEURONTIN ) 300 MG capsule, Take 1 capsule (300 mg total) by mouth 3 (three) times daily., Disp: 90 capsule, Rfl: 1   gabapentin  (NEURONTIN ) 800 MG tablet, Take 800 mg by mouth at bedtime., Disp: , Rfl:    insulin  lispro  (HUMALOG ) 100 UNIT/ML KiwkPen, Inject 55 Units into the skin daily. On insulin  pump, Disp: , Rfl:    JARDIANCE  10 MG TABS tablet, TAKE 1 TABLET(10 MG) BY MOUTH DAILY BEFORE BREAKFAST, Disp: 90 tablet, Rfl: 3   ketoconazole  (NIZORAL ) 2 % cream, Apply 1 Application topically daily., Disp: 60 g, Rfl: 0   levocetirizine (XYZAL ) 5 MG tablet, Take 1 tablet (5 mg total) by mouth daily at 12 noon., Disp: 90 tablet, Rfl: 1   levothyroxine  (SYNTHROID ) 50 MCG tablet, TAKE 1 TABLET BY MOUTH EVERY DAY ON MONDAY TO SATURDAY AND 2 TABLETS ON SUNDAY, Disp: 102 tablet, Rfl: 0   lubiprostone  (AMITIZA ) 8 MCG capsule, Take 1 capsule (8 mcg total) by mouth 2 (two) times daily with a meal., Disp: 180 capsule, Rfl: 1   metFORMIN  (GLUCOPHAGE ) 1000 MG tablet, Take 1,000 mg by mouth 2 (two) times daily with a meal., Disp: , Rfl:    montelukast  (SINGULAIR ) 10 MG tablet, Take 10 mg by mouth at bedtime., Disp: , Rfl:    Multiple Vitamins-Minerals (MENS MULTI VITAMIN & MINERAL PO), Take 1 tablet by mouth in the morning and at bedtime., Disp: , Rfl:    naloxone  (NARCAN ) nasal spray 4 mg/0.1 mL, For excess sedation from opioids, Disp: 1 kit, Rfl: 2   nitroGLYCERIN  (NITROSTAT ) 0.3 MG SL tablet, DISSOLVE 1 TABLET UNDER THE TONGUE EVERY 5 MINUTES AS NEEDED FOR CHEST PAIN, MAX 3 DOSES, Disp: 25 tablet, Rfl: 0   [START ON 04/23/2024] Oxycodone  HCl 10 MG TABS, Take 1 tablet (10 mg total) by mouth in the morning, at noon, in the evening, and at bedtime., Disp: 120 tablet, Rfl: 0   rosuvastatin  (CRESTOR ) 5 MG tablet, Take 5 mg by mouth daily with supper., Disp: , Rfl: 0   Semaglutide, 2 MG/DOSE, 8 MG/3ML SOPN, Inject into the skin., Disp: , Rfl:    spironolactone  (ALDACTONE ) 25 MG tablet, Take 0.5 tablets (12.5 mg total) by mouth daily., Disp: 45 tablet, Rfl: 3  Current Facility-Administered Medications:    sodium chloride  flush (NS) 0.9 % injection 3 mL, 3 mL, Intravenous, Q12H, Furth, Cadence H, PA-C   Past Medical History:   Diagnosis Date   Allergy    dust, seasonal (worse in the fall).   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 Followed by Pain Specialist in [place], back and neck   Asthma    BRONCHITIS   Cataract    First Dx in 2012   Chronic combined systolic and diastolic congestive heart failure (HCC)    a. 03/2018 Echo: EF 30-35%; b. 07/2018 Echo: EF 35-40%; c. 09/2019 TEE: EF 40-45%; d. 12/2020 Echo: EF 35-40%; e.01/2022  Echo: EF 45-50%. sev apical HK; f. 01/2023 Echo: EF 40-45%, mod asymm basal-septal LVH, apical AK, nl RV fxn.   CKD (chronic kidney disease), stage II    Coronary artery disease    a. Prior [employer]  MI->s/p mult stents->LAD/RCA ([place]); b. 2016 Cath: nonobs dzs;  c. 04/2018 Cath/PCI: LCX 4m (3.25x15 Sierra DES); d. 02/2022 MV: high risk; e. 02/2022 Cath: LM nl, LAD 20p ISR, patent mid-stent, LCX patent stent, OM1 nl, OM2 50p, patent stent, OM3 40p, patent stent, RCA 40p ISR, 21m ISR, 48m/d, patent distal stent, RPDA patent stent, RPAV small, 80 (sl progression)-->Med Rx.   Deaf, left    Diabetes mellitus without complication (HCC)    TYPE 2   Diabetic peripheral neuropathy (HCC)    feet and hands   FUO (fever of unknown origin) 08/03/2018   GERD (gastroesophageal reflux disease)    Headache    muscle tension   Hyperlipidemia    Hypertension    Hyperthyroidism    Insomnia    Ischemic cardiomyopathy    a. 03/2018 Echo: EF 30-35%; b. 07/2018 Echo: EF 35-40%, Gr1 DD; c. 09/2019 TEE: EF 40-45%; d. 12/2020 Echo: EF 35-40%. GrI DD; e. 01/2022 Echo: EF 45-50%; f. 01/2023 Echo: EF 40-45%.   Knee pain, acute 05/06/2020   Left arm weakness 10/04/2019   Left leg weakness 12/01/2019   Lyme disease    Chronic   Myasthenia gravis (HCC)    (03/18/21 - no current treatment - better than it has ever been per pt)   Myocardial infarction (HCC) 2010   Palpitations    a. 10/2020 [employer]: RSR, 88 avg. 3 brief SVT episodes (max 5 beats @ 128). Rare PACs/PVCs. Triggered events did not correlate w/ significant arrthymia -  some w/ sinus tach.   Seasonal allergies    Sepsis (HCC)    a.07/2018 - unknown source. TEE neg for veg 09/2019.   Sleep apnea    CPAP   Wears hearing aid in both ears    Assessment and Plan:  1. Chronic, continuous use of opioids   2. Chronic pain syndrome   3. Facet arthritis of lumbosacral region   4. Lumbar spondylosis with myelopathy   5. Sciatica, right side   6. Sciatica, left side   7. Left leg weakness   8. Low back pain at multiple sites   9. Lyme arthritis of multiple joints (HCC)   10. Cervicalgia   11. Degeneration of intervertebral disc of lumbar region with discogenic back pain and lower extremity pain   12. Degeneration of intervertebral disc of lumbar region with discogenic back pain    Patient our conversation and after review of the [place]  practitioner database I think it is appropriate to refill his medicines for the next 2 months.  This will be dated for July 20 and August 19.  No other changes in his medication regimen will be initiated.  Continue with stretching strengthening exercises both for his neck and low back.  Will schedule him for repeat epidural secondary to the crescendo style sciatica he experiences.  If the pain has improved we will plan to defer at his next visit in 2 months.  Continue core stretching strengthening exercises and he is currently doing tai chi to help with stability core strength and balance.  No other changes are initiated today and continue follow-up with his primary care physicians for baseline medical care. Follow Up Instructions:    I discussed the assessment and treatment plan with the patient. The patient was provided an opportunity to ask questions and all were answered. The patient agreed with the plan and demonstrated an understanding of the instructions.   The patient was advised to call back or seek an in-person evaluation  if the symptoms worsen or if the condition fails to improve as anticipated.  I provided 30  minutes of non-face-to-face time during this encounter.   Lynwood KANDICE Clause, MD

## 2024-04-21 ENCOUNTER — Other Ambulatory Visit: Payer: Self-pay | Admitting: Family Medicine

## 2024-04-21 DIAGNOSIS — B9689 Other specified bacterial agents as the cause of diseases classified elsewhere: Secondary | ICD-10-CM

## 2024-04-21 DIAGNOSIS — J4551 Severe persistent asthma with (acute) exacerbation: Secondary | ICD-10-CM

## 2024-04-27 ENCOUNTER — Ambulatory Visit (INDEPENDENT_AMBULATORY_CARE_PROVIDER_SITE_OTHER): Admitting: Nurse Practitioner

## 2024-04-27 VITALS — BP 124/84 | HR 98 | Temp 97.9°F | Resp 18 | Ht 67.0 in | Wt 221.0 lb

## 2024-04-27 DIAGNOSIS — A6920 Lyme disease, unspecified: Secondary | ICD-10-CM

## 2024-04-27 DIAGNOSIS — J4540 Moderate persistent asthma, uncomplicated: Secondary | ICD-10-CM | POA: Diagnosis not present

## 2024-04-27 DIAGNOSIS — R058 Other specified cough: Secondary | ICD-10-CM | POA: Diagnosis not present

## 2024-04-27 DIAGNOSIS — J208 Acute bronchitis due to other specified organisms: Secondary | ICD-10-CM

## 2024-04-27 DIAGNOSIS — R0602 Shortness of breath: Secondary | ICD-10-CM | POA: Diagnosis not present

## 2024-04-27 DIAGNOSIS — B9689 Other specified bacterial agents as the cause of diseases classified elsewhere: Secondary | ICD-10-CM | POA: Diagnosis not present

## 2024-04-27 MED ORDER — BENZONATATE 100 MG PO CAPS: 100.0000 mg | ORAL_CAPSULE | Freq: Three times a day (TID) | ORAL | 0 refills | Status: DC | PRN

## 2024-04-27 MED ORDER — PREDNISONE 10 MG (21) PO TBPK: ORAL_TABLET | ORAL | 0 refills | Status: DC

## 2024-04-27 MED ORDER — AMOXICILLIN-POT CLAVULANATE 875-125 MG PO TABS: 1.0000 | ORAL_TABLET | Freq: Two times a day (BID) | ORAL | 0 refills | Status: DC

## 2024-04-27 NOTE — Assessment & Plan Note (Signed)
 Continue taking Trelegy and Albuterol  for sx. Begin taking steroids and antibiotic for current productive cough and chest congestion. Order placed for Chest X-Ray. Continue to see Pulmonologist.

## 2024-04-27 NOTE — Progress Notes (Signed)
 BP 124/84   Pulse 98   Temp 97.9 F (36.6 C)   Resp 18   Ht 5' 7 (1.702 m)   Wt 221 lb (100.2 kg)   SpO2 98%   BMI 34.61 kg/m    Subjective:    Patient ID: [name], male    DOB: September 23, 1961, 63 y.o.   MRN: [id number]  HPI: [name] is a 63 y.o. male presenting here today for chest congestion that began 1 week to 10 days ago. He reports coughing and coughing up green/yellow sputum. No nasal discharge. He endorses SOB. Denies chest pain. Denies fever.  Patient treated for bacterial bronchitis back in June with Augmentin . Patient reports he has been struggling on and off with chest congestion. Patient reports he does see Pulmonology. Hx of CHF,  reports increased shortness of breath when laying down.   Asthma: Patient has a history of severe persistent asthma. Currently taking Trelegy Ellipta  and Albuterol . He also reports doing albuterol  nebulizer treatments when symptoms get worse. Patient reports he has been using his inhalers, but hasn't had to use his nebulizer machine.   Joint pain and fatigue:  -Patient reports hx of Lymes disease. Patient reports he feels like sx are similar to when he was first diagnosed with Lymes disease 3 years ago. Patient does report he sees Neurology for his Myasthenia Gravis.         03/17/2024    9:05 AM 01/11/2024   11:13 AM 12/22/2023    1:21 PM  Depression screen PHQ 2/9  Decreased Interest 1  0  Down, Depressed, Hopeless 1  1  PHQ - 2 Score 2  1  Altered sleeping 3    Tired, decreased energy 1    Change in appetite 0    Feeling bad or failure about yourself  0    Trouble concentrating 1    Moving slowly or fidgety/restless 0    Suicidal thoughts 0    PHQ-9 Score 7    Difficult doing work/chores Somewhat difficult       Information is confidential and restricted. Go to Review Flowsheets to unlock data.    Relevant past medical, surgical, family and social history reviewed and updated as indicated. Interim medical history  since our last visit reviewed. Allergies and medications reviewed and updated.  Review of Systems  Constitutional: Negative for fever or weight change.  Respiratory:Positive for productive cough and shortness of breath.   Cardiovascular: Negative for chest pain or palpitations.  Gastrointestinal: Negative for abdominal pain, no bowel changes.  Musculoskeletal: Negative for gait problem or joint swelling.  Skin: Negative for rash.  Neurological: Negative for dizziness or headache.   Objective:     BP 124/84   Pulse 98   Temp 97.9 F (36.6 C)   Resp 18   Ht 5' 7 (1.702 m)   Wt 221 lb (100.2 kg)   SpO2 98%   BMI 34.61 kg/m    Wt Readings from Last 3 Encounters:  04/27/24 221 lb (100.2 kg)  03/17/24 214 lb 11.2 oz (97.4 kg)  01/31/24 213 lb 9.6 oz (96.9 kg)    Physical Exam Constitutional:      Appearance: Normal appearance.  Cardiovascular:     Rate and Rhythm: Normal rate and regular rhythm.     Pulses: Normal pulses.     Heart sounds: Normal heart sounds.  Pulmonary:     Breath sounds: Examination of the right-middle field reveals wheezing. Examination of the left-middle  field reveals wheezing. Examination of the right-lower field reveals wheezing. Examination of the left-lower field reveals wheezing. Wheezing present.  Musculoskeletal:     Cervical back: Normal range of motion and neck supple.  Skin:    General: Skin is warm and dry.  Neurological:     General: No focal deficit present.     Mental Status: He is alert and oriented to person, place, and time.  Psychiatric:        Mood and Affect: Mood normal.        Behavior: Behavior normal.        Thought Content: Thought content normal.        Judgment: Judgment normal.      Results for orders placed or performed in visit on 01/05/24  [facility] DIABETES EYE EXAM   Collection Time: 01/05/24  1:45 PM  Result Value Ref Range   [facility] Diabetic Eye Exam Retinopathy (A) No Retinopathy   *Note: Due to a large number of  results and/or encounters for the requested time period, some results have not been displayed. A complete set of results can be found in Results Review.          Assessment & Plan:   Problem List Items Addressed This Visit       Respiratory   Asthma, moderate persistent, poorly-controlled   Continue taking Trelegy and Albuterol  for sx. Begin taking steroids and antibiotic for current productive cough and chest congestion. Order placed for Chest X-Ray. Continue to see Pulmonologist.       Relevant Medications   predniSONE  (STERAPRED UNI-PAK 21 TAB) 10 MG (21) TBPK tablet     Other   Lyme disease   Follow up with Neurologist.       Other Visit Diagnoses       Shortness of breath    -  Primary   BNP ordered. Chest X-ray ordered.Antibiotic and Steroid ordered. Continue using inhalers for asthma.   Relevant Medications   predniSONE  (STERAPRED UNI-PAK 21 TAB) 10 MG (21) TBPK tablet   benzonatate  (TESSALON ) 100 MG capsule   amoxicillin -clavulanate (AUGMENTIN ) 875-125 MG tablet   Other Relevant Orders   DG Chest 2 View   B Nat Peptide     Productive cough       Chest X ray ordered. Begin taking antibiotics and steroids. Tessalon  perles also ordered for relief.   Relevant Medications   predniSONE  (STERAPRED UNI-PAK 21 TAB) 10 MG (21) TBPK tablet   benzonatate  (TESSALON ) 100 MG capsule   amoxicillin -clavulanate (AUGMENTIN ) 875-125 MG tablet   Other Relevant Orders   CBC with Differential/Platelet   Comprehensive metabolic panel with GFR     Acute bacterial bronchitis       Begin taking antibiotic and steroids. Also provided tessalon  perles for relief. Chest X-ray ordered.   Relevant Medications   predniSONE  (STERAPRED UNI-PAK 21 TAB) 10 MG (21) TBPK tablet   benzonatate  (TESSALON ) 100 MG capsule   amoxicillin -clavulanate (AUGMENTIN ) 875-125 MG tablet       -Continue with Asthma management -Begin taking Augmentin  and Prednisone   -Tessalon  Perles ordered for cough relief   -Chest X ray ordered -Lab work ordered to check BNP, CBC, CMP  -Continue to see Pulmonologist.  -Plan to follow-up once we get results from lab work and chest x-ray          Follow up plan: Return if symptoms worsen or fail to improve.   I have reviewed this encounter including the documentation in this note and/or discussed  this patient with the provider, Aislinn Womack, SNP, I am certifying that I agree with the content of this note as supervising/preceptor nurse practitioner.  Mliss Spray, FNP-C [facility] 04/27/2024, 3:05 PM

## 2024-04-27 NOTE — Assessment & Plan Note (Signed)
Follow up with Neurologist

## 2024-04-28 ENCOUNTER — Ambulatory Visit: Payer: Self-pay | Admitting: Nurse Practitioner

## 2024-04-28 LAB — CBC WITH DIFFERENTIAL/PLATELET
Absolute Lymphocytes: 1447 {cells}/uL (ref 850–3900)
Absolute Monocytes: 742 {cells}/uL (ref 200–950)
Basophils Absolute: 50 {cells}/uL (ref 0–200)
Basophils Relative: 0.7 %
Eosinophils Absolute: 439 {cells}/uL (ref 15–500)
Eosinophils Relative: 6.1 %
HCT: 39.5 % (ref 38.5–50.0)
Hemoglobin: 12.7 g/dL — ABNORMAL LOW (ref 13.2–17.1)
MCH: 29.5 pg (ref 27.0–33.0)
MCHC: 32.2 g/dL (ref 32.0–36.0)
MCV: 91.6 fL (ref 80.0–100.0)
MPV: 10.1 fL (ref 7.5–12.5)
Monocytes Relative: 10.3 %
Neutro Abs: 4522 {cells}/uL (ref 1500–7800)
Neutrophils Relative %: 62.8 %
Platelets: 234 Thousand/uL (ref 140–400)
RBC: 4.31 Million/uL (ref 4.20–5.80)
RDW: 12.9 % (ref 11.0–15.0)
Total Lymphocyte: 20.1 %
WBC: 7.2 Thousand/uL (ref 3.8–10.8)

## 2024-04-28 LAB — COMPREHENSIVE METABOLIC PANEL WITH GFR
AG Ratio: 1.7 (calc) (ref 1.0–2.5)
ALT: 15 U/L (ref 9–46)
AST: 18 U/L (ref 10–35)
Albumin: 3.9 g/dL (ref 3.6–5.1)
Alkaline phosphatase (APISO): 73 U/L (ref 35–144)
BUN/Creatinine Ratio: 15 (calc) (ref 6–22)
BUN: 21 mg/dL (ref 7–25)
CO2: 32 mmol/L (ref 20–32)
Calcium: 9.2 mg/dL (ref 8.6–10.3)
Chloride: 101 mmol/L (ref 98–110)
Creat: 1.41 mg/dL — ABNORMAL HIGH (ref 0.70–1.35)
Globulin: 2.3 g/dL (ref 1.9–3.7)
Glucose, Bld: 106 mg/dL — ABNORMAL HIGH (ref 65–99)
Potassium: 4.6 mmol/L (ref 3.5–5.3)
Sodium: 140 mmol/L (ref 135–146)
Total Bilirubin: 0.4 mg/dL (ref 0.2–1.2)
Total Protein: 6.2 g/dL (ref 6.1–8.1)
eGFR: 56 mL/min/1.73m2 — ABNORMAL LOW (ref >=60)

## 2024-04-28 LAB — BRAIN NATRIURETIC PEPTIDE: Brain Natriuretic Peptide: 35 pg/mL (ref <100)

## 2024-05-15 ENCOUNTER — Ambulatory Visit: Payer: Self-pay | Admitting: *Deleted

## 2024-05-15 NOTE — Telephone Encounter (Signed)
 FYI Only or Action Required?: FYI only for provider.  Patient was last seen in primary care on 04/27/2024 by Gareth Mliss FALCON, FNP.  Called Nurse Triage reporting Rash.  Symptoms began several days ago.  Interventions attempted: OTC medications: Benadryl, hydrocortisone  cream.  Symptoms are: gradually worsening.  Triage Disposition: See Physician Within 24 Hours  Patient/caregiver understands and will follow disposition?: yes   Reason for Disposition  SEVERE itching (i.e., interferes with sleep, normal activities or school)  Answer Assessment - Initial Assessment Questions 1. APPEARANCE of RASH: What does the rash look like? (e.g., blisters, dry flaky skin, red spots, redness, sores)     Dry scaly, arm- itchy, tiny red inflamed bumps- red inflamed in crease of elbow 2. SIZE: How big are the spots? (e.g., tip of pen, eraser, coin; inches, centimeters)     tiny 3. LOCATION: Where is the rash located?     R arm 4. COLOR: What color is the rash? (Note: It is difficult to assess rash color in people with darker-colored skin. When this situation occurs, simply ask the caller to describe what they see.)     red 5. ONSET: When did the rash begin?     4 years- comes and goes 6. FEVER: Do you have a fever? If Yes, ask: What is your temperature, how was it measured, and when did it start?     Felt chills this am 7. ITCHING: Does the rash itch? If Yes, ask: How bad is the itch? (Scale 1-10; or mild, moderate, severe)     severe 8. CAUSE: What do you think is causing the rash?     Unsure- did see dermatology- treated with cream- 4 years ago 9. MEDICINE FACTORS: Have you started any new medicines within the last 2 weeks? (e.g., antibiotics)      no  Protocols used: Rash or Redness - Widespread-A-AH  Copied from CRM #[id number]. Topic: Clinical - Red Word Triage >> May 15, 2024  3:21 PM Donee H wrote: Red Word that prompted transfer to Nurse Triage: Patient concerned  about a rash that appeared 3 days ago. He states he is experiencing severe itching and it's dry in some spots. Patiented states started in legs which are getting better now, but it has spreaded to arms now. There is swelliong in arm. He originally asked to set appointment to see pcp

## 2024-05-16 ENCOUNTER — Encounter: Payer: Self-pay | Admitting: Family Medicine

## 2024-05-16 ENCOUNTER — Ambulatory Visit (INDEPENDENT_AMBULATORY_CARE_PROVIDER_SITE_OTHER): Admitting: Family Medicine

## 2024-05-16 VITALS — BP 112/68 | HR 97 | Resp 16 | Ht 67.0 in | Wt 219.8 lb

## 2024-05-16 DIAGNOSIS — L308 Other specified dermatitis: Secondary | ICD-10-CM | POA: Diagnosis not present

## 2024-05-16 DIAGNOSIS — L219 Seborrheic dermatitis, unspecified: Secondary | ICD-10-CM | POA: Diagnosis not present

## 2024-05-16 MED ORDER — TRIAMCINOLONE ACETONIDE 0.1 % EX CREA: 1.0000 | TOPICAL_CREAM | Freq: Two times a day (BID) | CUTANEOUS | 0 refills | Status: AC | PRN

## 2024-05-16 MED ORDER — KETOCONAZOLE 2 % EX CREA: 1.0000 | TOPICAL_CREAM | Freq: Every day | CUTANEOUS | 0 refills | Status: AC

## 2024-05-16 NOTE — Progress Notes (Signed)
 Name: [name]   MRN: [id number]    DOB: 02-Apr-1961   Date:05/16/2024       Progress Note  Subjective  Chief Complaint  Chief Complaint  Patient presents with   Rash     R arm has random flares up which started a few days ago. Ongoing 4 years on/off, Dry scaly, arm- itchy, tiny red inflamed bumps- red. Has tried Benadryl and hydrocortisone  cream    Discussed the use of AI scribe software for clinical note transcription with the patient, who gave verbal consent to proceed.  History of Present Illness [name] is a 63 year old male with asthma and allergic rhinitis who presents with a rash.  He has had a persistent rash for six to seven years, primarily affecting his legs and arms. The rash is described as dry, bumpy, itchy, and sometimes bubbly, with occasional spreading to other areas, including his arm, which is currently red and blotchy. The rash is intermittent, with periods of flare-ups.  He uses over-the-counter Cortizone10 for mild symptoms, which provides some relief. During more severe episodes, he uses a cream prescribed by a dermatologist. He is currently using cortisone ten for his skin issues but notes that it is no longer as effective as before, especially during severe flare-ups.  He also reports seborrheic dermatitis on his face, particularly around his beard and nose, which appears red and sometimes peels. No similar symptoms on his scalp, possibly due to shaving his head.  His past medical history includes asthma and allergic rhinitis. He inquires about the potential role of stress in exacerbating his symptoms.    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) 12/02/2021   Gastropathy 08/13/2021   Iron  deficiency anemia due to chronic blood loss    Pain due to onychomycosis of toenails of both feet 11/11/2020   Lung involvement associated with another disorder (HCC) 05/17/2020   Lower urinary tract symptoms 09/15/2019   Incomplete bladder emptying 09/15/2019   Carpal tunnel syndrome on both sides 07/12/2019   Idiopathic peripheral neuropathy 04/21/2019   Sensory ataxia 03/09/2019   OSA on CPAP 07/08/2018   Congestive heart failure (CHF) (HCC) 05/05/2018   Unstable angina (HCC) 04/26/2018   Ischemic cardiomyopathy 04/26/2018   Chronic combined systolic and diastolic CHF (congestive heart failure) (HCC) 04/26/2018   Anginal equivalent (HCC) 04/25/2018   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HCC) 01/25/2018   Vitamin D  deficiency, unspecified 08/12/2017   Ptosis of left eyelid 01/08/2017   Dermatitis 12/24/2016   Benign neoplasm of sigmoid colon    Benign neoplasm of descending colon    Benign neoplasm of transverse colon    Coronary artery disease involving native coronary artery with angina pectoris (HCC) 10/22/2015   Coronary artery disease 08/22/2015   Myasthenia gravis (HCC) 08/22/2015   Chronic left-sided low back pain 08/22/2015   Hypertension 08/22/2015   GERD (gastroesophageal reflux disease) 08/22/2015   Hyperlipidemia 08/22/2015   Insomnia 08/22/2015   Type 2 diabetes mellitus with diabetic polyneuropathy, with long-term current use of insulin  (HCC) 08/22/2015   Lyme disease 06/05/1992  Social History   Tobacco Use   Smoking status: Never   Smokeless tobacco: Never   Tobacco comments:    smoking cessation materials not required  Substance Use Topics   Alcohol use: Not Currently    Alcohol/week: 0.0 standard drinks of alcohol      Current Outpatient Medications:    albuterol  (PROVENTIL ) (2.5 MG/3ML) 0.083% nebulizer solution, Take 3 mLs (2.5 mg total) by nebulization every 6 (six) hours as needed for wheezing or shortness of breath., Disp: 75 mL, Rfl: 2   albuterol  (VENTOLIN  HFA) 108 (90 Base) MCG/ACT inhaler, Inhale 2 puffs into the lungs daily., Disp: , Rfl:    ammonium lactate  (AMLACTIN) 12 % lotion, Apply 1 Application topically as needed for dry skin., Disp: 400 g, Rfl: 3   amoxicillin -clavulanate (AUGMENTIN ) 875-125 MG tablet, Take 1 tablet by mouth 2 (two) times daily., Disp: 20 tablet, Rfl: 0   aspirin  EC 81 MG tablet, Take 1 tablet (81 mg total) by mouth daily., Disp: 90 tablet, Rfl: 3   benzonatate  (TESSALON ) 100 MG capsule, Take 1-2 capsules (100-200 mg total) by mouth 3 (three) times daily as needed for cough., Disp: 40 capsule, Rfl: 0   clopidogrel  (PLAVIX ) 75 MG tablet, TAKE 1 TABLET BY MOUTH DAILY WITH BREAKFAST, Disp: 90 tablet, Rfl: 3   cyclobenzaprine  (FLEXERIL ) 10 MG tablet, Take 1 tablet (10 mg total) by mouth 2 (two) times daily., Disp: 60 tablet, Rfl: 3   DULoxetine  (CYMBALTA ) 30 MG capsule, Take 1 capsule (30 mg total) by mouth daily., Disp: 90 capsule, Rfl: 1   DULoxetine  (CYMBALTA ) 60 MG capsule, Take 1 capsule (60 mg total) by mouth daily., Disp: 90 capsule, Rfl: 1   ezetimibe  (ZETIA ) 10 MG tablet, TAKE 1 TABLET(10 MG) BY MOUTH DAILY, Disp: 90 tablet, Rfl: 0   fluticasone  furoate-vilanterol (BREO ELLIPTA ) 200-25 MCG/ACT AEPB, Inhale 1 puff into the lungs daily., Disp: , Rfl:    furosemide  (LASIX ) 40 MG tablet, Take 1 tablet (40 mg total) by mouth daily., Disp: 90 tablet, Rfl: 3   gabapentin  (NEURONTIN ) 300 MG capsule, Take 1 capsule (300 mg total) by mouth 3 (three) times daily., Disp: 90 capsule, Rfl: 1   gabapentin  (NEURONTIN ) 800 MG tablet, Take 800 mg by mouth at bedtime., Disp: , Rfl:    insulin  lispro (HUMALOG ) 100 UNIT/ML KiwkPen, Inject 55 Units into the skin daily. On insulin  pump,  Disp: , Rfl:    JARDIANCE  10 MG TABS tablet, TAKE 1 TABLET(10 MG) BY MOUTH DAILY BEFORE BREAKFAST, Disp: 90 tablet, Rfl: 3   ketoconazole  (NIZORAL ) 2 % cream, Apply 1 Application topically daily., Disp: 60 g, Rfl: 0   levocetirizine (XYZAL ) 5 MG tablet, Take 1 tablet (5 mg total) by mouth daily at 12 noon., Disp: 90 tablet, Rfl: 1   levothyroxine  (SYNTHROID ) 50 MCG tablet, TAKE 1 TABLET BY MOUTH EVERY DAY ON MONDAY TO SATURDAY AND 2 TABLETS ON SUNDAY, Disp: 102 tablet, Rfl: 0   lubiprostone  (AMITIZA ) 8 MCG capsule, Take 1 capsule (8 mcg total) by mouth 2 (two) times daily with a meal., Disp: 180 capsule, Rfl: 1   metFORMIN  (GLUCOPHAGE ) 1000 MG tablet, Take 1,000 mg by mouth 2 (two) times daily with a meal., Disp: , Rfl:    montelukast  (SINGULAIR ) 10 MG tablet, Take 10 mg by mouth at bedtime., Disp: , Rfl:    Multiple Vitamins-Minerals (MENS MULTI VITAMIN & MINERAL PO), Take 1 tablet by mouth in the morning and at bedtime., Disp: , Rfl:    naloxone  (  NARCAN ) nasal spray 4 mg/0.1 mL, For excess sedation from opioids, Disp: 1 kit, Rfl: 2   nitroGLYCERIN  (NITROSTAT ) 0.3 MG SL tablet, DISSOLVE 1 TABLET UNDER THE TONGUE EVERY 5 MINUTES AS NEEDED FOR CHEST PAIN, MAX 3 DOSES, Disp: 25 tablet, Rfl: 0   Oxycodone  HCl 10 MG TABS, Take 1 tablet (10 mg total) by mouth in the morning, at noon, in the evening, and at bedtime., Disp: 120 tablet, Rfl: 0   [START ON 05/23/2024] Oxycodone  HCl 10 MG TABS, Take 1 tablet (10 mg total) by mouth in the morning, at noon, in the evening, and at bedtime., Disp: 120 tablet, Rfl: 0   predniSONE  (STERAPRED UNI-PAK 21 TAB) 10 MG (21) TBPK tablet, Take as directed on package.  (60 mg po on day 1, 50 mg po on day 2...), Disp: 21 tablet, Rfl: 0   rosuvastatin  (CRESTOR ) 5 MG tablet, Take 5 mg by mouth daily with supper., Disp: , Rfl: 0   Semaglutide, 2 MG/DOSE, 8 MG/3ML SOPN, Inject into the skin., Disp: , Rfl:    spironolactone  (ALDACTONE ) 25 MG tablet, Take 0.5 tablets (12.5 mg total)  by mouth daily., Disp: 45 tablet, Rfl: 3  Current Facility-Administered Medications:    sodium chloride  flush (NS) 0.9 % injection 3 mL, 3 mL, Intravenous, Q12H, Furth, Cadence H, PA-C  Allergies  Allergen Reactions   Azathioprine Other (See Comments)    Azathioprine hypersensitivity reaction - Symptoms mimicking sepsis - was hospitalized   Novolog  [Insulin  Aspart (Human Analog) (Yeast)] Hives    ROS  Ten systems reviewed and is negative except as mentioned in HPI    Objective  Vitals:   05/16/24 1058  BP: 112/68  Pulse: 97  Resp: 16  SpO2: 95%  Weight: 219 lb 12.8 oz (99.7 kg)  Height: 5' 7 (1.702 m)    Body mass index is 34.43 kg/m.  Physical Exam CONSTITUTIONAL: Patient appears well-developed and well-nourished. No distress. HEENT: Head atraumatic, normocephalic, neck supple. CARDIOVASCULAR: Normal rate, regular rhythm and normal heart sounds. No murmur heard. No BLE edema. PULMONARY: Effort normal. Wheezing present anteriorly, absent posteriorly. No respiratory distress. PSYCHIATRIC: Patient has a normal mood and affect. Behavior is normal. Judgment and thought content normal. SKIN: Eczematous patches on arms and legs, seborrheic dermatitis on face.      Recent Results (from the past 2160 hours)  B Nat Peptide     Status: None   Collection Time: 04/27/24  1:56 PM  Result Value Ref Range   Brain Natriuretic Peptide 35 <100 pg/mL    Comment: . BNP levels increase with age in the general population with the highest values seen in individuals greater than 29 years of age. Reference: J. Am. Penne. Cardiol. 2002; 59:023-017. SABRA   CBC with Differential/Platelet     Status: Abnormal   Collection Time: 04/27/24  1:56 PM  Result Value Ref Range   WBC 7.2 3.8 - 10.8 Thousand/uL   RBC 4.31 4.20 - 5.80 Million/uL   Hemoglobin 12.7 (L) 13.2 - 17.1 g/dL   HCT 60.4 61.4 - 49.9 %   MCV 91.6 80.0 - 100.0 fL   MCH 29.5 27.0 - 33.0 pg   MCHC 32.2 32.0 - 36.0 g/dL     Comment: For adults, a slight decrease in the calculated MCHC value (in the range of 30 to 32 g/dL) is most likely not clinically significant; however, it should be interpreted with caution in correlation with other red cell parameters and the patient's clinical condition.  RDW 12.9 11.0 - 15.0 %   Platelets 234 140 - 400 Thousand/uL   MPV 10.1 7.5 - 12.5 fL   Neutro Abs 4,522 1,500 - 7,800 cells/uL   Absolute Lymphocytes 1,447 850 - 3,900 cells/uL   Absolute Monocytes 742 200 - 950 cells/uL   Eosinophils Absolute 439 15 - 500 cells/uL   Basophils Absolute 50 0 - 200 cells/uL   Neutrophils Relative % 62.8 %   Total Lymphocyte 20.1 %   Monocytes Relative 10.3 %   Eosinophils Relative 6.1 %   Basophils Relative 0.7 %  Comprehensive metabolic panel with GFR     Status: Abnormal   Collection Time: 04/27/24  1:56 PM  Result Value Ref Range   Glucose, Bld 106 (H) 65 - 99 mg/dL    Comment: .            Fasting reference interval . For someone without known diabetes, a glucose value between 100 and 125 mg/dL is consistent with prediabetes and should be confirmed with a follow-up test. .    BUN 21 7 - 25 mg/dL   Creat 8.58 (H) 9.29 - 1.35 mg/dL   eGFR 56 (L) > OR = 60 mL/min/1.53m2   BUN/Creatinine Ratio 15 6 - 22 (calc)   Sodium 140 135 - 146 mmol/L   Potassium 4.6 3.5 - 5.3 mmol/L   Chloride 101 98 - 110 mmol/L   CO2 32 20 - 32 mmol/L   Calcium  9.2 8.6 - 10.3 mg/dL   Total Protein 6.2 6.1 - 8.1 g/dL   Albumin 3.9 3.6 - 5.1 g/dL   Globulin 2.3 1.9 - 3.7 g/dL (calc)   AG Ratio 1.7 1.0 - 2.5 (calc)   Total Bilirubin 0.4 0.2 - 1.2 mg/dL   Alkaline phosphatase (APISO) 73 35 - 144 U/L   AST 18 10 - 35 U/L   ALT 15 9 - 46 U/L      Assessment & Plan Eczema of arms and legs Chronic eczema with intermittent flares, possible allergic component due to asthma and allergic rhinitis. Stress component considered. - Prescribed triamcinolone  in a large tub for arms and legs,  avoiding sensitive areas. - Instructed to mix triamcinolone  with unscented moisturizer in equal parts before application. - Advised to apply triamcinolone  twice daily during flares, discontinue once cleared. - Educated on avoiding application on face, groin, and axilla.  Seborrheic dermatitis of face Mild seborrheic dermatitis around beard and nose, no scalp involvement. Insurance does not cover Zoryve. - Provided sample of Zoryve for facial application. - Prescribed ketoconazole  cream for facial rash, ensuring insurance coverage. - Instructed to use ketoconazole  cream on face, especially around nose and beard. - Advised to use blue shampoo on beard twice weekly if scaly or red.

## 2024-05-23 ENCOUNTER — Other Ambulatory Visit: Payer: Self-pay | Admitting: Family Medicine

## 2024-05-23 DIAGNOSIS — J302 Other seasonal allergic rhinitis: Secondary | ICD-10-CM

## 2024-05-24 ENCOUNTER — Other Ambulatory Visit: Payer: Self-pay | Admitting: Anesthesiology

## 2024-05-26 ENCOUNTER — Ambulatory Visit: Admitting: Podiatry

## 2024-05-26 DIAGNOSIS — Z91198 Patient's noncompliance with other medical treatment and regimen for other reason: Secondary | ICD-10-CM

## 2024-05-26 DIAGNOSIS — E1165 Type 2 diabetes mellitus with hyperglycemia: Secondary | ICD-10-CM | POA: Diagnosis not present

## 2024-05-26 NOTE — Progress Notes (Signed)
 1. Failure to attend appointment with reason given    Appointment rescheduled by patient.

## 2024-05-31 ENCOUNTER — Ambulatory Visit (INDEPENDENT_AMBULATORY_CARE_PROVIDER_SITE_OTHER): Payer: Medicare Other | Admitting: Family Medicine

## 2024-05-31 ENCOUNTER — Encounter: Payer: Self-pay | Admitting: Family Medicine

## 2024-05-31 VITALS — BP 110/72 | HR 89 | Resp 18 | Ht 67.0 in | Wt 218.0 lb

## 2024-05-31 DIAGNOSIS — G7000 Myasthenia gravis without (acute) exacerbation: Secondary | ICD-10-CM

## 2024-05-31 DIAGNOSIS — F1120 Opioid dependence, uncomplicated: Secondary | ICD-10-CM

## 2024-05-31 DIAGNOSIS — G4733 Obstructive sleep apnea (adult) (pediatric): Secondary | ICD-10-CM | POA: Diagnosis not present

## 2024-05-31 DIAGNOSIS — M4697 Unspecified inflammatory spondylopathy, lumbosacral region: Secondary | ICD-10-CM | POA: Diagnosis not present

## 2024-05-31 DIAGNOSIS — E063 Autoimmune thyroiditis: Secondary | ICD-10-CM | POA: Diagnosis not present

## 2024-05-31 DIAGNOSIS — Z23 Encounter for immunization: Secondary | ICD-10-CM

## 2024-05-31 DIAGNOSIS — D649 Anemia, unspecified: Secondary | ICD-10-CM | POA: Diagnosis not present

## 2024-05-31 DIAGNOSIS — Z794 Long term (current) use of insulin: Secondary | ICD-10-CM

## 2024-05-31 DIAGNOSIS — F330 Major depressive disorder, recurrent, mild: Secondary | ICD-10-CM

## 2024-05-31 DIAGNOSIS — I700 Atherosclerosis of aorta: Secondary | ICD-10-CM

## 2024-05-31 DIAGNOSIS — J302 Other seasonal allergic rhinitis: Secondary | ICD-10-CM

## 2024-05-31 DIAGNOSIS — N1831 Chronic kidney disease, stage 3a: Secondary | ICD-10-CM

## 2024-05-31 DIAGNOSIS — I5020 Unspecified systolic (congestive) heart failure: Secondary | ICD-10-CM | POA: Diagnosis not present

## 2024-05-31 DIAGNOSIS — E1142 Type 2 diabetes mellitus with diabetic polyneuropathy: Secondary | ICD-10-CM | POA: Diagnosis not present

## 2024-05-31 MED ORDER — DULOXETINE HCL 30 MG PO CPEP: 30.0000 mg | ORAL_CAPSULE | Freq: Every day | ORAL | 1 refills | Status: AC

## 2024-05-31 MED ORDER — DULOXETINE HCL 60 MG PO CPEP: 60.0000 mg | ORAL_CAPSULE | Freq: Every day | ORAL | 1 refills | Status: AC

## 2024-05-31 MED ORDER — LEVOCETIRIZINE DIHYDROCHLORIDE 5 MG PO TABS: 5.0000 mg | ORAL_TABLET | Freq: Every day | ORAL | 1 refills | Status: AC

## 2024-05-31 NOTE — Progress Notes (Signed)
 Name: [name]   MRN: [id number]    DOB: 04-Sep-1961   Date:05/31/2024       Progress Note  Subjective  Chief Complaint  Chief Complaint  Patient presents with   Medical Management of Chronic Issues   Discussed the use of AI scribe software for clinical note transcription with the patient, who gave verbal consent to proceed.  History of Present Illness [name] is a 63 year old male with coronary heart disease and congestive heart failure who presents for a regular follow-up visit.  He has been on disability since 2012 due to coronary heart disease and congestive heart failure, experiencing intermittent chest pain, shortness of breath, and fatigue. He continues regular follow-ups with his cardiologist and undergoes echocardiograms. He is on medical management and has significant physical limitations, including difficulty with bending, standing, and balance. He uses a cane for mobility.  He has type 2 diabetes, insulin -requiring, and recently started using an insulin  pump, which he finds beneficial for better glucose control. He experiences hypoglycemic episodes two to three times a week, with warnings from his Dexcom G7 device. His last A1c was 7%. He takes rosuvastatin  5 mg and ezetimibe  for dyslipidemia and hypertension, which are well-controlled. He reports occasional dizziness when standing up too quickly.  He experiences neuropathy pain rated at 5-6 out of 10, managed with gabapentin  300 mg three times a day and 800 mg at bedtime. He also takes duloxetine  for chronic pain and depression, which he finds effective.  He has congestive heart failure with reduced ejection fraction. He uses wedge pillows to alleviate breathing difficulties when lying flat.  Denies lower extremity edema and is compliant with medications and follow ups with cardiologist   He reports occasional shortness of breath with exertion and has asthma, managed with Symbicort  and a rescue inhaler.  He  has chronic kidney disease stage 3A, with stable GFR, and mild anemia of chronic disease.   He is compliant with his CPAP for sleep apnea and sees a specialist for asthma management.  He has inflammatory spondylopathy, managed with oxycodone , cyclobenzaprine , and periodic injections. He has started Tai Chi for flexibility, which he finds helpful.  He takes Amitiza   for opioid-induced constipation and has Narcan  at home.   He uses Xyzal  for allergies and Singulair  for asthma.   He reports stable mood with duloxetine , despite recent family stressors.    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/02/2021   Gastropathy 08/13/2021   Iron  deficiency anemia due to chronic blood loss    Pain due to onychomycosis of toenails of both feet 11/11/2020   Lung involvement associated with another disorder (HCC) 05/17/2020   Lower urinary tract symptoms 09/15/2019   Incomplete bladder emptying 09/15/2019   Carpal tunnel syndrome on both sides 07/12/2019   Idiopathic peripheral neuropathy 04/21/2019   Sensory ataxia 03/09/2019   OSA on CPAP 07/08/2018   Congestive heart failure (CHF) (HCC) 05/05/2018   Unstable angina (HCC) 04/26/2018   Ischemic cardiomyopathy 04/26/2018   Chronic combined systolic and diastolic CHF (congestive heart failure) (HCC) 04/26/2018  Anginal equivalent (HCC) 04/25/2018   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HCC) 01/25/2018   Vitamin D  deficiency, unspecified 08/12/2017   Ptosis of left eyelid  01/08/2017   Dermatitis 12/24/2016   Benign neoplasm of sigmoid colon    Benign neoplasm of descending colon    Benign neoplasm of transverse colon    Coronary artery disease involving native coronary artery with angina pectoris (HCC) 10/22/2015   Coronary artery disease 08/22/2015   Myasthenia gravis (HCC) 08/22/2015   Chronic left-sided low back pain 08/22/2015   Hypertension 08/22/2015   GERD (gastroesophageal reflux disease) 08/22/2015   Hyperlipidemia 08/22/2015   Insomnia 08/22/2015   Type 2 diabetes mellitus with diabetic polyneuropathy, with long-term current use of insulin  (HCC) 08/22/2015   Lyme disease 06/05/1992    Past Surgical History:  Procedure Laterality Date   BILATERAL CARPAL TUNNEL RELEASE Bilateral L in 2012 and R in 2013   CARDIAC CATHETERIZATION     Several Caths, most recent in  March 2016.   COLONOSCOPY WITH PROPOFOL  N/A 01/10/2016   Procedure: COLONOSCOPY WITH PROPOFOL ;  Surgeon: Rogelia Copping, MD;  Location: [facility];  Service: Endoscopy;  Laterality: N/A;   COLONOSCOPY WITH PROPOFOL  N/A 04/14/2021   Procedure: COLONOSCOPY WITH PROPOFOL ;  Surgeon: Copping Rogelia, MD;  Location: [facility];  Service: Endoscopy;  Laterality: N/A;  Diabetic - insulin  and oral meds   CORONARY ANGIOPLASTY     CORONARY STENT INTERVENTION N/A 04/25/2018   Procedure: CORONARY STENT INTERVENTION;  Surgeon: Darron Deatrice LABOR, MD;  Location: [facility];  Service: Cardiovascular;  Laterality: N/A;   ESOPHAGOGASTRODUODENOSCOPY (EGD) WITH PROPOFOL  N/A 01/10/2016   Procedure: ESOPHAGOGASTRODUODENOSCOPY (EGD) WITH PROPOFOL ;  Surgeon: Rogelia Copping, MD;  Location: [facility];  Service: Endoscopy;  Laterality: N/A;   ESOPHAGOGASTRODUODENOSCOPY (EGD) WITH PROPOFOL  N/A 04/14/2021   Procedure: ESOPHAGOGASTRODUODENOSCOPY (EGD) WITH BIOPSY;  Surgeon: Copping Rogelia, MD;  Location: [facility];  Service: Endoscopy;  Laterality: N/A;   EYE SURGERY Bilateral 2012    cataract/bilateral vitrectomies   GIVENS CAPSULE STUDY N/A 08/20/2021   Procedure: GIVENS CAPSULE STUDY;  Surgeon: Copping Rogelia, MD;  Location: [facility];  Service: Endoscopy;  Laterality: N/A;   LEFT HEART CATH AND CORONARY ANGIOGRAPHY Left 04/25/2018   Procedure: LEFT HEART CATH AND CORONARY ANGIOGRAPHY;  Surgeon: Darron Deatrice LABOR, MD;  Location: [facility];  Service: Cardiovascular;  Laterality: Left;   LEFT HEART CATH AND CORONARY ANGIOGRAPHY Left 02/02/2022   Procedure: LEFT HEART CATH AND CORONARY ANGIOGRAPHY;  Surgeon: Darron Deatrice LABOR, MD;  Location: [facility];  Service: Cardiovascular;  Laterality: Left;   TEE WITHOUT CARDIOVERSION N/A 09/05/2018   Procedure: TRANSESOPHAGEAL ECHOCARDIOGRAM (TEE);  Surgeon: Darron Deatrice LABOR, MD;  Location: [facility];  Service: Cardiovascular;  Laterality: N/A;   TONSILLECTOMY AND ADENOIDECTOMY     As a child   TUNNELED VENOUS CATHETER PLACEMENT     removed    Family History  Problem Relation Age of Onset   Anxiety disorder Mother    Depression Mother    Diabetes Mother    Heart disease Mother    Cancer Father        Prostate CA, Anal cancer    Dementia Father    Diabetes Brother    Healthy Brother    Healthy Brother     Social History   Tobacco Use   Smoking status: Never   Smokeless tobacco: Never   Tobacco comments:    smoking cessation materials not required  Substance  Use Topics   Alcohol use: Not Currently    Alcohol/week: 0.0 standard drinks of alcohol     Current Outpatient Medications:    albuterol  (PROVENTIL ) (2.5 MG/3ML) 0.083% nebulizer solution, Take 3 mLs (2.5 mg total) by nebulization every 6 (six) hours as needed for wheezing or shortness of breath., Disp: 75 mL, Rfl: 2   albuterol  (VENTOLIN  HFA) 108 (90 Base) MCG/ACT inhaler, Inhale 2 puffs into the lungs daily., Disp: , Rfl:    ammonium lactate  (AMLACTIN) 12 % lotion, Apply 1 Application topically as needed for dry skin., Disp: 400 g, Rfl:  3   aspirin  EC 81 MG tablet, Take 1 tablet (81 mg total) by mouth daily., Disp: 90 tablet, Rfl: 3   clopidogrel  (PLAVIX ) 75 MG tablet, TAKE 1 TABLET BY MOUTH DAILY WITH BREAKFAST, Disp: 90 tablet, Rfl: 3   cyclobenzaprine  (FLEXERIL ) 10 MG tablet, Take 1 tablet (10 mg total) by mouth 2 (two) times daily., Disp: 60 tablet, Rfl: 3   DULoxetine  (CYMBALTA ) 30 MG capsule, Take 1 capsule (30 mg total) by mouth daily., Disp: 90 capsule, Rfl: 1   DULoxetine  (CYMBALTA ) 60 MG capsule, Take 1 capsule (60 mg total) by mouth daily., Disp: 90 capsule, Rfl: 1   ezetimibe  (ZETIA ) 10 MG tablet, TAKE 1 TABLET(10 MG) BY MOUTH DAILY, Disp: 90 tablet, Rfl: 0   fluticasone  furoate-vilanterol (BREO ELLIPTA ) 200-25 MCG/ACT AEPB, Inhale 1 puff into the lungs daily., Disp: , Rfl:    furosemide  (LASIX ) 40 MG tablet, Take 1 tablet (40 mg total) by mouth daily., Disp: 90 tablet, Rfl: 3   gabapentin  (NEURONTIN ) 300 MG capsule, Take 1 capsule (300 mg total) by mouth 3 (three) times daily., Disp: 90 capsule, Rfl: 1   gabapentin  (NEURONTIN ) 800 MG tablet, Take 800 mg by mouth at bedtime., Disp: , Rfl:    insulin  lispro (HUMALOG ) 100 UNIT/ML KiwkPen, Inject 55 Units into the skin daily. On insulin  pump, Disp: , Rfl:    JARDIANCE  10 MG TABS tablet, TAKE 1 TABLET(10 MG) BY MOUTH DAILY BEFORE BREAKFAST, Disp: 90 tablet, Rfl: 3   ketoconazole  (NIZORAL ) 2 % cream, Apply 1 Application topically daily. For rash on face, Disp: 60 g, Rfl: 0   levocetirizine (XYZAL ) 5 MG tablet, Take 1 tablet (5 mg total) by mouth daily at 12 noon., Disp: 90 tablet, Rfl: 1   levothyroxine  (SYNTHROID ) 50 MCG tablet, TAKE 1 TABLET BY MOUTH EVERY DAY ON MONDAY TO SATURDAY AND 2 TABLETS ON SUNDAY, Disp: 102 tablet, Rfl: 0   lubiprostone  (AMITIZA ) 8 MCG capsule, Take 1 capsule (8 mcg total) by mouth 2 (two) times daily with a meal., Disp: 180 capsule, Rfl: 1   metFORMIN  (GLUCOPHAGE ) 1000 MG tablet, Take 1,000 mg by mouth 2 (two) times daily with a meal., Disp: ,  Rfl:    montelukast  (SINGULAIR ) 10 MG tablet, Take 10 mg by mouth at bedtime., Disp: , Rfl:    Multiple Vitamins-Minerals (MENS MULTI VITAMIN & MINERAL PO), Take 1 tablet by mouth in the morning and at bedtime., Disp: , Rfl:    naloxone  (NARCAN ) nasal spray 4 mg/0.1 mL, For excess sedation from opioids, Disp: 1 kit, Rfl: 2   nitroGLYCERIN  (NITROSTAT ) 0.3 MG SL tablet, DISSOLVE 1 TABLET UNDER THE TONGUE EVERY 5 MINUTES AS NEEDED FOR CHEST PAIN, MAX 3 DOSES, Disp: 25 tablet, Rfl: 0   Oxycodone  HCl 10 MG TABS, Take 1 tablet (10 mg total) by mouth in the morning, at noon, in the evening, and at bedtime., Disp: 120 tablet,  Rfl: 0   rosuvastatin  (CRESTOR ) 5 MG tablet, Take 5 mg by mouth daily with supper., Disp: , Rfl: 0   Semaglutide, 2 MG/DOSE, 8 MG/3ML SOPN, Inject into the skin., Disp: , Rfl:    spironolactone  (ALDACTONE ) 25 MG tablet, Take 0.5 tablets (12.5 mg total) by mouth daily., Disp: 45 tablet, Rfl: 3   triamcinolone  cream (KENALOG ) 0.1 %, Apply 1 Application topically 2 (two) times daily as needed. For rash on arms/legs, Disp: 453.6 g, Rfl: 0   amoxicillin -clavulanate (AUGMENTIN ) 875-125 MG tablet, Take 1 tablet by mouth 2 (two) times daily. (Patient not taking: Reported on 05/31/2024), Disp: 20 tablet, Rfl: 0   benzonatate  (TESSALON ) 100 MG capsule, Take 1-2 capsules (100-200 mg total) by mouth 3 (three) times daily as needed for cough. (Patient not taking: Reported on 05/31/2024), Disp: 40 capsule, Rfl: 0   predniSONE  (STERAPRED UNI-PAK 21 TAB) 10 MG (21) TBPK tablet, Take as directed on package.  (60 mg po on day 1, 50 mg po on day 2...) (Patient not taking: Reported on 05/31/2024), Disp: 21 tablet, Rfl: 0  Current Facility-Administered Medications:    sodium chloride  flush (NS) 0.9 % injection 3 mL, 3 mL, Intravenous, Q12H, Furth, Cadence H, PA-C  Allergies  Allergen Reactions   Azathioprine Other (See Comments)    Azathioprine hypersensitivity reaction - Symptoms mimicking sepsis - was  hospitalized   Novolog  [Insulin  Aspart (Human Analog) (Yeast)] Hives    I personally reviewed active problem list, medication list, allergies, family history with the patient/caregiver today.   ROS  Ten systems reviewed and is negative except as mentioned in HPI    Objective Physical Exam  CONSTITUTIONAL: Patient appears well-developed and well-nourished. No distress. HEENT: Head atraumatic, normocephalic, neck supple. CARDIOVASCULAR: Normal rate, regular rhythm and normal heart sounds. No murmur heard. No BLE edema. PULMONARY: Effort normal. Inspiratory wheezing present. MUSCULOSKELETAL: uses a cane, slow gait PSYCHIATRIC: Patient has a normal mood and affect. Behavior is normal. Judgment and thought content normal. NEUROLOGICAL: Ptosis more pronounced /chronic and present on left side   Vitals:   05/31/24 0943  BP: 110/72  Pulse: 89  Resp: 18  SpO2: 94%  Weight: 218 lb (98.9 kg)  Height: 5' 7 (1.702 m)    Body mass index is 34.14 kg/m.  Recent Results (from the past 2160 hours)  Hemoglobin A1c     Status: None   Collection Time: 03/06/24 12:00 AM  Result Value Ref Range   Hemoglobin A1C 7.0   B Nat Peptide     Status: None   Collection Time: 04/27/24  1:56 PM  Result Value Ref Range   Brain Natriuretic Peptide 35 <100 pg/mL    Comment: . BNP levels increase with age in the general population with the highest values seen in individuals greater than 65 years of age. Reference: J. Am. Penne. Cardiol. 2002; 59:023-017. SABRA   CBC with Differential/Platelet     Status: Abnormal   Collection Time: 04/27/24  1:56 PM  Result Value Ref Range   WBC 7.2 3.8 - 10.8 Thousand/uL   RBC 4.31 4.20 - 5.80 Million/uL   Hemoglobin 12.7 (L) 13.2 - 17.1 g/dL   HCT 60.4 61.4 - 49.9 %   MCV 91.6 80.0 - 100.0 fL   MCH 29.5 27.0 - 33.0 pg   MCHC 32.2 32.0 - 36.0 g/dL    Comment: For adults, a slight decrease in the calculated MCHC value (in the range of 30 to 32 g/dL) is most  likely not  clinically significant; however, it should be interpreted with caution in correlation with other red cell parameters and the patient's clinical condition.    RDW 12.9 11.0 - 15.0 %   Platelets 234 140 - 400 Thousand/uL   MPV 10.1 7.5 - 12.5 fL   Neutro Abs 4,522 1,500 - 7,800 cells/uL   Absolute Lymphocytes 1,447 850 - 3,900 cells/uL   Absolute Monocytes 742 200 - 950 cells/uL   Eosinophils Absolute 439 15 - 500 cells/uL   Basophils Absolute 50 0 - 200 cells/uL   Neutrophils Relative % 62.8 %   Total Lymphocyte 20.1 %   Monocytes Relative 10.3 %   Eosinophils Relative 6.1 %   Basophils Relative 0.7 %  Comprehensive metabolic panel with GFR     Status: Abnormal   Collection Time: 04/27/24  1:56 PM  Result Value Ref Range   Glucose, Bld 106 (H) 65 - 99 mg/dL    Comment: .            Fasting reference interval . For someone without known diabetes, a glucose value between 100 and 125 mg/dL is consistent with prediabetes and should be confirmed with a follow-up test. .    BUN 21 7 - 25 mg/dL   Creat 8.58 (H) 9.29 - 1.35 mg/dL   eGFR 56 (L) > OR = 60 mL/min/1.32m2   BUN/Creatinine Ratio 15 6 - 22 (calc)   Sodium 140 135 - 146 mmol/L   Potassium 4.6 3.5 - 5.3 mmol/L   Chloride 101 98 - 110 mmol/L   CO2 32 20 - 32 mmol/L   Calcium  9.2 8.6 - 10.3 mg/dL   Total Protein 6.2 6.1 - 8.1 g/dL   Albumin 3.9 3.6 - 5.1 g/dL   Globulin 2.3 1.9 - 3.7 g/dL (calc)   AG Ratio 1.7 1.0 - 2.5 (calc)   Total Bilirubin 0.4 0.2 - 1.2 mg/dL   Alkaline phosphatase (APISO) 73 35 - 144 U/L   AST 18 10 - 35 U/L   ALT 15 9 - 46 U/L     PHQ2/9:    05/31/2024    9:34 AM 05/16/2024   10:57 AM 03/17/2024    9:05 AM 01/11/2024   11:13 AM 12/22/2023    1:21 PM  Depression screen PHQ 2/9  Decreased Interest 1 0 1  0  Down, Depressed, Hopeless 1 0 1  1  PHQ - 2 Score 2 0 2  1  Altered sleeping 1 0 3    Tired, decreased energy 1 0 1    Change in appetite 1 0 0    Feeling bad or failure  about yourself  0 0 0    Trouble concentrating 1 0 1    Moving slowly or fidgety/restless 0 0 0    Suicidal thoughts 0 0 0    PHQ-9 Score 6 0 7    Difficult doing work/chores Somewhat difficult Not difficult at all Somewhat difficult       Information is confidential and restricted. Go to Review Flowsheets to unlock data.    phq 9 is positive  Fall Risk:    05/31/2024    9:34 AM 05/16/2024   10:57 AM 03/17/2024    9:04 AM 12/22/2023    1:21 PM 09/21/2023    2:41 PM  Fall Risk   Falls in the past year? 0 0 0 0 0  Number falls in past yr: 0 0 0 0   Injury with Fall? 0 0 0 0  Risk for fall due to : No Fall Risks No Fall Risks No Fall Risks    Follow up Falls evaluation completed Falls evaluation completed Education provided;Falls evaluation completed;Falls prevention discussed       Assessment & Plan Coronary artery disease and congestive heart failure with reduced ejection fraction Chronic coronary artery disease and congestive heart failure with reduced ejection fraction. Intermittent chest pain, dyspnea, and fatigue. Orthopnea managed with wedge pillows. - Continue spironolactone , furosemide , Plavix , rosuvastatin , and ezetimibe . - Advise hydration without fluid overload. - Encourage slow positional changes to prevent dizziness. - Follow-up with cardiologist in October.  Type 2 diabetes mellitus, insulin  requiring, complicated by diabetic neuropathy with chronic pain Type 2 diabetes mellitus with good control (A1c 7% in June). Experiences hypoglycemic episodes 2-3 times a week. Diabetic neuropathy with chronic pain rated 5-6/10. - Continue insulin  pump and Dexcom G7 monitoring. - Continue gabapentin  300 mg three times a day and 800 mg at bedtime. - Continue metformin  1000 mg, Ozempic, and hemolog on pump. - Manage hypoglycemic episodes with carbohydrate intake.  Chronic kidney disease stage 3A Chronic kidney disease stage 3A with stable GFR. Anemia of chronic disease  suspected. - Order iron  studies including total iron  binding capacity and ferritin.  Hypertension Hypertension well controlled with current medications. Blood pressure 110/72. Experiences dizziness when standing quickly, likely due to medication effects. - Continue current antihypertensive regimen. - Advise slow positional changes to prevent dizziness.  Dyslipidemia Dyslipidemia managed with rosuvastatin  and ezetimibe . - Continue rosuvastatin  5 mg and ezetimibe .  Asthma Asthma managed with Symbicort  and rescue inhaler. Inspiratory wheezing noted. - Continue Symbicort  and rescue inhaler as needed.  Obstructive sleep apnea, on CPAP Obstructive sleep apnea managed with CPAP. Compliance reported. - Continue CPAP therapy.  Myasthenia gravis with ptosis and fatigue Chronic myasthenia gravis with ptosis and fatigue. Ptosis more pronounced, likely due to poor sleep. - Monitor symptoms and manage fatigue.  Hypothyroidism, autoimmune Autoimmune hypothyroidism managed with daily medication. No changes in bowel movements or swallowing difficulties. - Continue current thyroid  medication. - Monitor TSH levels.  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 - Continue oxycodone  and cyclobenzaprine  as prescribed. - Continue periodic injections as needed. - Encourage Tai Chi for flexibility.  Chronic pain syndrome with opioid dependency  Chronic pain syndrome managed with gabapentin  and pain management strategies. Pain rated 5-6/10. - Continue gabapentin  and pain management strategies.  Opioid-induced constipation Opioid-induced constipation managed with amitizumab. - Continue amitizumab for constipation management.  Major depressive disorder, recurrent, stable on medication Major depressive disorder stable on duloxetine . PHQ-9 score is 6. Medication effective during recent  stress. - Continue duloxetine . - Monitor mental health status.  Anemia unspecified, likely of chronic disease  Anemia of chronic disease suspected. Iron  studies not yet performed. - Order iron  studies including total iron  binding capacity and ferritin.  General Health Maintenance Due for pneumococcal vaccine. Flu shot advised soon. - Administer pneumococcal vaccine. - Schedule flu shot for October.

## 2024-06-01 ENCOUNTER — Ambulatory Visit: Payer: Self-pay | Admitting: Family Medicine

## 2024-06-01 LAB — IRON,TIBC AND FERRITIN PANEL
%SAT: 16 % — ABNORMAL LOW (ref 20–48)
Ferritin: 12 ng/mL — ABNORMAL LOW (ref 24–380)
Iron: 56 ug/dL (ref 50–180)
TIBC: 352 ug/dL (ref 250–425)

## 2024-06-01 LAB — MICROALBUMIN / CREATININE URINE RATIO
Creatinine, Urine: 60 mg/dL (ref 20–320)
Microalb Creat Ratio: 13 mg/g{creat} (ref <30)
Microalb, Ur: 0.8 mg/dL

## 2024-06-04 ENCOUNTER — Other Ambulatory Visit: Payer: Self-pay | Admitting: Family Medicine

## 2024-06-04 DIAGNOSIS — T402X5A Adverse effect of other opioids, initial encounter: Secondary | ICD-10-CM

## 2024-06-06 ENCOUNTER — Other Ambulatory Visit: Payer: Self-pay | Admitting: *Deleted

## 2024-06-06 ENCOUNTER — Telehealth: Payer: Self-pay | Admitting: Anesthesiology

## 2024-06-06 DIAGNOSIS — E1165 Type 2 diabetes mellitus with hyperglycemia: Secondary | ICD-10-CM | POA: Diagnosis not present

## 2024-06-06 MED ORDER — CYCLOBENZAPRINE HCL 10 MG PO TABS: 10.0000 mg | ORAL_TABLET | Freq: Two times a day (BID) | ORAL | 3 refills | Status: DC

## 2024-06-06 NOTE — Telephone Encounter (Signed)
 Med refill request sent to [facility]

## 2024-06-06 NOTE — Telephone Encounter (Signed)
 PT called stated that [facility] didn't call send Flexeril  into the pharmacy. PT stated that he has been out since Friday. Please give patient a call. TY

## 2024-06-06 NOTE — Telephone Encounter (Signed)
 Rx sent, called patient to let him know.  Voicemail left.

## 2024-06-11 ENCOUNTER — Other Ambulatory Visit: Payer: Self-pay | Admitting: Nurse Practitioner

## 2024-06-12 ENCOUNTER — Ambulatory Visit (INDEPENDENT_AMBULATORY_CARE_PROVIDER_SITE_OTHER): Admitting: Podiatry

## 2024-06-12 ENCOUNTER — Other Ambulatory Visit: Payer: Self-pay | Admitting: Family Medicine

## 2024-06-12 DIAGNOSIS — E063 Autoimmune thyroiditis: Secondary | ICD-10-CM

## 2024-06-12 DIAGNOSIS — Z91198 Patient's noncompliance with other medical treatment and regimen for other reason: Secondary | ICD-10-CM

## 2024-06-13 NOTE — Progress Notes (Signed)
 1. Failure to attend appointment with reason given    Appointment rescheduled by patient.

## 2024-06-19 ENCOUNTER — Ambulatory Visit (INDEPENDENT_AMBULATORY_CARE_PROVIDER_SITE_OTHER): Admitting: Podiatry

## 2024-06-19 DIAGNOSIS — Z91198 Patient's noncompliance with other medical treatment and regimen for other reason: Secondary | ICD-10-CM

## 2024-06-19 NOTE — Progress Notes (Signed)
 1. Failure to attend appointment with reason given    Appointment rescheduled by patient.

## 2024-06-21 ENCOUNTER — Ambulatory Visit: Attending: Anesthesiology | Admitting: Anesthesiology

## 2024-06-21 ENCOUNTER — Encounter: Payer: Self-pay | Admitting: Anesthesiology

## 2024-06-21 DIAGNOSIS — M4716 Other spondylosis with myelopathy, lumbar region: Secondary | ICD-10-CM

## 2024-06-21 DIAGNOSIS — R29898 Other symptoms and signs involving the musculoskeletal system: Secondary | ICD-10-CM

## 2024-06-21 DIAGNOSIS — G894 Chronic pain syndrome: Secondary | ICD-10-CM | POA: Diagnosis not present

## 2024-06-21 DIAGNOSIS — G7000 Myasthenia gravis without (acute) exacerbation: Secondary | ICD-10-CM

## 2024-06-21 DIAGNOSIS — M545 Low back pain, unspecified: Secondary | ICD-10-CM

## 2024-06-21 DIAGNOSIS — M47817 Spondylosis without myelopathy or radiculopathy, lumbosacral region: Secondary | ICD-10-CM

## 2024-06-21 DIAGNOSIS — Z79891 Long term (current) use of opiate analgesic: Secondary | ICD-10-CM

## 2024-06-21 DIAGNOSIS — M5136 Other intervertebral disc degeneration, lumbar region with discogenic back pain only: Secondary | ICD-10-CM | POA: Diagnosis not present

## 2024-06-21 DIAGNOSIS — M542 Cervicalgia: Secondary | ICD-10-CM

## 2024-06-21 DIAGNOSIS — F1190 Opioid use, unspecified, uncomplicated: Secondary | ICD-10-CM

## 2024-06-21 MED ORDER — OXYCODONE HCL 10 MG PO TABS: 10.0000 mg | ORAL_TABLET | Freq: Four times a day (QID) | ORAL | 0 refills | Status: AC

## 2024-06-21 NOTE — Progress Notes (Signed)
 Virtual Visit via Video Note  I connected with [name] on 06/21/24 at  8:40 AM EDT by a video enabled telemedicine application and verified that I am speaking with the correct person using two identifiers.  Location: Patient: Home Provider: [facility]   I discussed the limitations of evaluation and management by telemedicine and the availability of in person appointments. The patient expressed understanding and agreed to proceed.  History of Present Illness: I was able to catch up with [name] for our pain clinic appointment.  He was unable to do the virtual portion of this.  He reports that he has been doing well recently.  He still has his baseline low back pain but his sciatica symptoms have been better since his March epidural.  He still gets pain radiating into the left more so than right hip and posterior buttock region but not down the leg.  The pain in his low back is severe at baseline but drops from a pain score of 7 or 8 down to a 1 or 2 for about 4 to 6 hours when he takes his oxycodone .  This continues to work well for him and enables him to stay active functional and derive good lifestyle benefit.  He has failed more conservative therapy.  No side effects with the opioid medications are noted.  Otherwise he is in his usual state of health.  He takes his Flexeril  to help with muscle spasms during the day as well.  He tries to stay active.  He has some chronic left lower extremity weakness which has not changed for many years.  Otherwise no change in bowel or bladder function is noted.  Review of systems: General: No fevers or chills Pulmonary: No shortness of breath or dyspnea Cardiac: No angina or palpitations or lightheadedness GI: No abdominal pain or constipation Psych: No depression    Observations/Objective:  Current Outpatient Medications:    [START ON 07/22/2024] Oxycodone  HCl 10 MG TABS, Take 1 tablet (10 mg total) by mouth in the morning, at noon,  in the evening, and at bedtime., Disp: 120 tablet, Rfl: 0   albuterol  (PROVENTIL ) (2.5 MG/3ML) 0.083% nebulizer solution, Take 3 mLs (2.5 mg total) by nebulization every 6 (six) hours as needed for wheezing or shortness of breath., Disp: 75 mL, Rfl: 2   albuterol  (VENTOLIN  HFA) 108 (90 Base) MCG/ACT inhaler, Inhale 2 puffs into the lungs daily., Disp: , Rfl:    ammonium lactate  (AMLACTIN) 12 % lotion, Apply 1 Application topically as needed for dry skin., Disp: 400 g, Rfl: 3   aspirin  EC 81 MG tablet, Take 1 tablet (81 mg total) by mouth daily., Disp: 90 tablet, Rfl: 3   clopidogrel  (PLAVIX ) 75 MG tablet, TAKE 1 TABLET BY MOUTH DAILY WITH BREAKFAST, Disp: 90 tablet, Rfl: 3   cyclobenzaprine  (FLEXERIL ) 10 MG tablet, Take 1 tablet (10 mg total) by mouth 2 (two) times daily., Disp: 60 tablet, Rfl: 3   DULoxetine  (CYMBALTA ) 30 MG capsule, Take 1 capsule (30 mg total) by mouth daily., Disp: 90 capsule, Rfl: 1   DULoxetine  (CYMBALTA ) 60 MG capsule, Take 1 capsule (60 mg total) by mouth daily., Disp: 90 capsule, Rfl: 1   ezetimibe  (ZETIA ) 10 MG tablet, TAKE 1 TABLET(10 MG) BY MOUTH DAILY, Disp: 90 tablet, Rfl: 2   furosemide  (LASIX ) 40 MG tablet, Take 1 tablet (40 mg total) by mouth daily., Disp: 90 tablet, Rfl: 3   gabapentin  (NEURONTIN ) 300 MG capsule, Take 1 capsule (300  mg total) by mouth 3 (three) times daily., Disp: 90 capsule, Rfl: 1   gabapentin  (NEURONTIN ) 800 MG tablet, Take 800 mg by mouth at bedtime., Disp: , Rfl:    insulin  lispro (HUMALOG ) 100 UNIT/ML KiwkPen, Inject 55 Units into the skin daily. On insulin  pump, Disp: , Rfl:    JARDIANCE  10 MG TABS tablet, TAKE 1 TABLET(10 MG) BY MOUTH DAILY BEFORE BREAKFAST, Disp: 90 tablet, Rfl: 3   ketoconazole  (NIZORAL ) 2 % cream, Apply 1 Application topically daily. For rash on face, Disp: 60 g, Rfl: 0   levocetirizine (XYZAL ) 5 MG tablet, Take 1 tablet (5 mg total) by mouth daily at 12 noon., Disp: 90 tablet, Rfl: 1   levothyroxine  (SYNTHROID ) 50 MCG  tablet, TAKE 1 TABLET BY MOUTH EVERY DAY ON MONDAY TO SATURDAY AND 2 TABLETS ON SUNDAY, Disp: 102 tablet, Rfl: 0   lubiprostone  (AMITIZA ) 8 MCG capsule, TAKE 1 CAPSULE(8 MCG) BY MOUTH TWICE DAILY WITH A MEAL, Disp: 180 capsule, Rfl: 1   metFORMIN  (GLUCOPHAGE ) 1000 MG tablet, Take 1,000 mg by mouth 2 (two) times daily with a meal., Disp: , Rfl:    montelukast  (SINGULAIR ) 10 MG tablet, Take 10 mg by mouth at bedtime., Disp: , Rfl:    Multiple Vitamins-Minerals (MENS MULTI VITAMIN & MINERAL PO), Take 1 tablet by mouth in the morning and at bedtime., Disp: , Rfl:    naloxone  (NARCAN ) nasal spray 4 mg/0.1 mL, For excess sedation from opioids, Disp: 1 kit, Rfl: 2   nitroGLYCERIN  (NITROSTAT ) 0.3 MG SL tablet, DISSOLVE 1 TABLET UNDER THE TONGUE EVERY 5 MINUTES AS NEEDED FOR CHEST PAIN, MAX 3 DOSES, Disp: 25 tablet, Rfl: 0   [START ON 06/22/2024] Oxycodone  HCl 10 MG TABS, Take 1 tablet (10 mg total) by mouth in the morning, at noon, in the evening, and at bedtime., Disp: 120 tablet, Rfl: 0   rosuvastatin  (CRESTOR ) 5 MG tablet, Take 5 mg by mouth daily with supper., Disp: , Rfl: 0   Semaglutide, 2 MG/DOSE, 8 MG/3ML SOPN, Inject into the skin., Disp: , Rfl:    spironolactone  (ALDACTONE ) 25 MG tablet, Take 0.5 tablets (12.5 mg total) by mouth daily., Disp: 45 tablet, Rfl: 3   triamcinolone  cream (KENALOG ) 0.1 %, Apply 1 Application topically 2 (two) times daily as needed. For rash on arms/legs, Disp: 453.6 g, Rfl: 0  Current Facility-Administered Medications:    sodium chloride  flush (NS) 0.9 % injection 3 mL, 3 mL, Intravenous, Q12H, Furth, Cadence H, PA-C   Past Medical History:  Diagnosis Date   Allergy    dust, seasonal (worse in the fall).   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 Followed by Pain Specialist in [place], back and neck   Asthma    BRONCHITIS   Cataract    First Dx in 2012   Chronic combined systolic and diastolic congestive heart failure (HCC)    a. 03/2018 Echo: EF 30-35%; b. 07/2018  Echo: EF 35-40%; c. 09/2019 TEE: EF 40-45%; d. 12/2020 Echo: EF 35-40%; e.01/2022  Echo: EF 45-50%. sev apical HK; f. 01/2023 Echo: EF 40-45%, mod asymm basal-septal LVH, apical AK, nl RV fxn.   CKD (chronic kidney disease), stage II    Coronary artery disease    a. Prior Ant MI->s/p mult stents->LAD/RCA ([place]); b. 2016 Cath: nonobs dzs;  c. 04/2018 Cath/PCI: LCX 69m (3.25x15 Sierra DES); d. 02/2022 MV: high risk; e. 02/2022 Cath: LM nl, LAD 20p ISR, patent mid-stent, LCX patent stent, OM1 nl,  OM2 50p, patent stent, OM3 40p, patent stent, RCA 40p ISR, 32m ISR, 31m/d, patent distal stent, RPDA patent stent, RPAV small, 80 (sl progression)-->Med Rx.   Deaf, left    Diabetes mellitus without complication (HCC)    TYPE 2   Diabetic peripheral neuropathy (HCC)    feet and hands   FUO (fever of unknown origin) 08/03/2018   GERD (gastroesophageal reflux disease)    Headache    muscle tension   Hyperlipidemia    Hypertension    Hyperthyroidism    Insomnia    Ischemic cardiomyopathy    a. 03/2018 Echo: EF 30-35%; b. 07/2018 Echo: EF 35-40%, Gr1 DD; c. 09/2019 TEE: EF 40-45%; d. 12/2020 Echo: EF 35-40%. GrI DD; e. 01/2022 Echo: EF 45-50%; f. 01/2023 Echo: EF 40-45%.   Knee pain, acute 05/06/2020   Left arm weakness 10/04/2019   Left leg weakness 12/01/2019   Lyme disease    Chronic   Myasthenia gravis (HCC)    (03/18/21 - no current treatment - better than it has ever been per pt)   Myocardial infarction (HCC) 2010   Palpitations    a. 10/2020 [employer]: RSR, 88 avg. 3 brief SVT episodes (max 5 beats @ 128). Rare PACs/PVCs. Triggered events did not correlate w/ significant arrthymia - some w/ sinus tach.   Seasonal allergies    Sepsis (HCC)    a.07/2018 - unknown source. TEE neg for veg 09/2019.   Sleep apnea    CPAP   Wears hearing aid in both ears    Assessment and Plan:  1. Chronic, continuous use of opioids   2. Chronic pain syndrome   3. Facet arthritis of lumbosacral region   4. Lumbar spondylosis  with myelopathy   5. Left leg weakness   6. Low back pain at multiple sites   7. Cervicalgia   8. Degeneration of intervertebral disc of lumbar region with discogenic back pain   9. Myasthenia gravis (HCC)    Based on conversation today and after review of the   practitioner database information I think is appropriate to refill his medications.  He is currently on oxycodone  10 mg tablets and due for refill on 918 and 1018.  He continues to derive good functional lifestyle improvement with the medication and no side effects reported.  Will schedule him for return to clinic in 2 months.  Continue with his current exercise protocol and stretching strengthening exercises.  Continue follow-up with his primary care physicians for baseline medical care Follow Up Instructions:    I discussed the assessment and treatment plan with the patient. The patient was provided an opportunity to ask questions and all were answered. The patient agreed with the plan and demonstrated an understanding of the instructions.   The patient was advised to call back or seek an in-person evaluation if the symptoms worsen or if the condition fails to improve as anticipated.  I provided 30 minutes of non-face-to-face time during this encounter.   Lynwood KANDICE Clause, MD

## 2024-07-10 ENCOUNTER — Ambulatory Visit

## 2024-07-13 ENCOUNTER — Ambulatory Visit: Admitting: Licensed Clinical Social Worker

## 2024-07-13 DIAGNOSIS — F331 Major depressive disorder, recurrent, moderate: Secondary | ICD-10-CM | POA: Diagnosis not present

## 2024-07-13 DIAGNOSIS — Z634 Disappearance and death of family member: Secondary | ICD-10-CM | POA: Diagnosis not present

## 2024-07-13 NOTE — Progress Notes (Signed)
 THERAPIST PROGRESS NOTE  Session Time: 1:03-1:47pm  Participation Level: Active  Behavioral Response: Casual, Alert, depressed   Type of Therapy: Individual Therapy  Treatment Goals addressed:  Active     OP Depression     LTG: Reduce frequency, intensity, and duration of depression symptoms so that daily functioning is improved     Start:  07/13/24    Expected End:  10/13/24         STG: Process the recent loss of his wife while finding ways to cope to progress towards a stage of acceptance     Start:  07/13/24    Expected End:  10/13/24         Coping skills     Start:  07/13/24       Will work with the pt using CBT/DBT techniques to help the pt verbalize an understanding of the cognitive, physiological, and behavioral components of depression and its treatment. This will be done by using worksheets, interactive activities, CBT/ABC thought logs, modeling, homework, role playing and journaling. Will work with pt to learn and implement coping skills that result in a reduction of depression and improve daily functioning per pt self-report 3 out of 5 documented sessions.       Educate patient on: Stages of grief     Start:  07/13/24            Assess grief responses     Start:  07/13/24               ProgressTowards Goals: Initial  Interventions: Supportive and Other: Grief work  Summary: [name] is a 63 y.o. male who presents with symptoms of anxiety. Patient reports symptoms to include difficulty falling and staying asleep, avoidance, restlessness, tearfulness, low mood. Pt was oriented times 5. Pt was cooperative and engaged. Pt denies SI/HI/AVH.   Patient returns to reestablish care with therapist following the recent death of his wife on 07-12-24.  Patient identifies support of his son and friends as she unexpectedly passed in her sleep.  Patient also identifies further stressors as his mother-in-law's health is declining as well.   Patient reflected on process of grieving citing he has not had a good cry since his wife passed identifying he may still be in shock.  Patient reports he has been coping by cleaning to keep busy.  Patient reports he has been waking up in the middle of the night which he believes is tied to the way his wife passed wishing he could go back and catch the time she passed in an effort to intervene.  Patient reports on average she is resting 4 to 5 hours a night.  Patient identifies he continues to take his mental health medications and will be going to his PCP provider tomorrow to obtain guidance on possible sleep medications.  Patient reports he is also coping by prioritizing a normal routine and spending more time outside.  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  Patient identifies the majority of his difficulty with bereavement occurs at night but he is mindful of engaging in meditations to help fall asleep.  Patient reports he is considering moving to be closer to his support system.  Identified a fear of slowing down for fear of guilt believing I should have done more , loneliness, and loss.  Patient also reports his appetite is low but he has been forcing himself to eat.  Suicidal/Homicidal: Nowithout intent/plan  Therapist Response: Cln utilized active and supportive reflection to create safe environment for patient to process recent life stressors. Clinician assessed for current symptoms, stressors, safety since last session. Clinician provided brief psychoeducation on the stages of grief and offered support as patient processed recent loss.  Reflected on patient's use of self-care during this time.  Plan: Return again in 2 weeks.  Diagnosis: MDD (major depressive disorder), recurrent episode, moderate (HCC)  Recent bereavement   Collaboration of Care: Meet with LCSW per availability.  Patient/Guardian was advised Release  of Information must be obtained prior to any record release in order to collaborate their care with an outside provider. Patient/Guardian was advised if they have not already done so to contact the registration department to sign all necessary forms in order for us  to release information regarding their care.   Consent: Patient/Guardian gives verbal consent for treatment and assignment of benefits for services provided during this visit. Patient/Guardian expressed understanding and agreed to proceed.   Evalene KATHEE Husband, LCSW 07/13/2024

## 2024-07-14 ENCOUNTER — Ambulatory Visit (INDEPENDENT_AMBULATORY_CARE_PROVIDER_SITE_OTHER)

## 2024-07-14 DIAGNOSIS — Z23 Encounter for immunization: Secondary | ICD-10-CM | POA: Diagnosis not present

## 2024-07-17 ENCOUNTER — Ambulatory Visit (INDEPENDENT_AMBULATORY_CARE_PROVIDER_SITE_OTHER): Admitting: Podiatry

## 2024-07-17 DIAGNOSIS — Z91198 Patient's noncompliance with other medical treatment and regimen for other reason: Secondary | ICD-10-CM

## 2024-07-17 NOTE — Progress Notes (Signed)
 1. Failure to attend appointment with reason given    Appointment canceled and rescheduled by patient.

## 2024-07-24 ENCOUNTER — Ambulatory Visit: Admitting: Family Medicine

## 2024-07-24 VITALS — BP 112/74 | HR 97 | Resp 16 | Ht 67.0 in | Wt 215.8 lb

## 2024-07-24 DIAGNOSIS — F5102 Adjustment insomnia: Secondary | ICD-10-CM | POA: Diagnosis not present

## 2024-07-24 DIAGNOSIS — Z794 Long term (current) use of insulin: Secondary | ICD-10-CM | POA: Diagnosis not present

## 2024-07-24 DIAGNOSIS — F4321 Adjustment disorder with depressed mood: Secondary | ICD-10-CM | POA: Diagnosis not present

## 2024-07-24 DIAGNOSIS — F339 Major depressive disorder, recurrent, unspecified: Secondary | ICD-10-CM | POA: Diagnosis not present

## 2024-07-24 DIAGNOSIS — E1142 Type 2 diabetes mellitus with diabetic polyneuropathy: Secondary | ICD-10-CM

## 2024-07-24 DIAGNOSIS — G8929 Other chronic pain: Secondary | ICD-10-CM

## 2024-07-24 DIAGNOSIS — M25511 Pain in right shoulder: Secondary | ICD-10-CM | POA: Diagnosis not present

## 2024-07-24 MED ORDER — BUSPIRONE HCL 5 MG PO TABS: 5.0000 mg | ORAL_TABLET | Freq: Three times a day (TID) | ORAL | 0 refills | Status: DC | PRN

## 2024-07-24 MED ORDER — QUETIAPINE FUMARATE 25 MG PO TABS: 25.0000 mg | ORAL_TABLET | Freq: Every day | ORAL | 0 refills | Status: DC

## 2024-07-24 NOTE — Progress Notes (Signed)
 Name: [name]   MRN: [id number]    DOB: [birth year]   Date:07/24/2024       Progress Note  Subjective  Chief Complaint  Chief Complaint  Patient presents with   Depression    Since wife passed   Insomnia   Discussed the use of AI scribe software for clinical note transcription with the patient, who gave verbal consent to proceed.  History of Present Illness [name] Json Koelzer is a 63 year old male who presents with worsening depression and grief after the recent death of his wife.  He is experiencing worsening depression and grief following the death of his wife on 2024-07-13. She had been hospitalized in early September for pneumonia and was at home when she passed away. He describes being in a 'funk' with no motivation to complete necessary tasks, such as managing joint accounts and other responsibilities. He is attempting to accomplish a little each day but finds it challenging.  He has a history of generalized anxiety disorder and depression. He is currently taking duloxetine  60 mg and 30 mg for depression but is not on any medication specifically for anxiety. He has difficulty sleeping, going to bed between 11 PM and 12 AM, and waking up at 3 AM, unable to return to sleep. He is not taking any medication for sleep.  He also has type 2 diabetes and diabetic polyneuropathy, managed with insulin . He notes that his blood sugar levels are elevated. He is trying to maintain a routine and avoid eating at night despite waking up early.  He reports chronic right shoulder pain, which has worsened over time. The pain began after an old injury involving a fall on ice while carrying a ladder. He experiences significant pain with movement, particularly when raising his arm above 80 degrees.  His social history includes living with his step son, who is also grieving the loss of his mother. His son internalizes his emotions and is not openly sharing his feelings. He is currently seeing  a therapist every two weeks, which he finds helpful for managing his grief and other stressors, including family issues.    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/02/2021   Gastropathy 08/13/2021   Iron  deficiency anemia due to chronic blood loss    Pain due to onychomycosis of toenails of both feet 11/11/2020   Lung involvement associated with another disorder (HCC) 05/17/2020   Lower urinary tract symptoms 09/15/2019   Incomplete bladder emptying 09/15/2019   Carpal tunnel syndrome on both sides 07/12/2019   Idiopathic peripheral neuropathy 04/21/2019   Sensory ataxia 03/09/2019   OSA on CPAP 07/08/2018   Congestive heart failure (CHF) (HCC) 05/05/2018   Unstable angina (HCC) 04/26/2018   Ischemic cardiomyopathy 04/26/2018   Chronic combined systolic and diastolic CHF (congestive heart failure) (HCC) 04/26/2018   Anginal equivalent 04/25/2018   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/25/2018   Vitamin D  deficiency, unspecified 08/12/2017   Ptosis of left eyelid 01/08/2017   Dermatitis 12/24/2016   Benign neoplasm of sigmoid colon    Benign neoplasm of descending colon  Benign neoplasm of transverse colon    Coronary artery disease involving native coronary artery with angina pectoris 10/22/2015   Coronary artery disease 08/22/2015   Myasthenia gravis (HCC) 08/22/2015   Chronic left-sided low back pain 08/22/2015   Hypertension 08/22/2015   GERD (gastroesophageal reflux disease) 08/22/2015   Hyperlipidemia  08/22/2015   Insomnia 08/22/2015   Type 2 diabetes mellitus with diabetic polyneuropathy, with long-term current use of insulin  (HCC) 08/22/2015   Lyme disease 06/05/1992    Past Surgical History:  Procedure Laterality Date   BILATERAL CARPAL TUNNEL RELEASE Bilateral L in 2012 and R in 2013   CARDIAC CATHETERIZATION     Several Caths, most recent in  March 2016.   COLONOSCOPY WITH PROPOFOL  N/A 01/10/2016   Procedure: COLONOSCOPY WITH PROPOFOL ;  Surgeon: Rogelia Copping, MD;  Location: [facility];  Service: Endoscopy;  Laterality: N/A;   COLONOSCOPY WITH PROPOFOL  N/A 04/14/2021   Procedure: COLONOSCOPY WITH PROPOFOL ;  Surgeon: Copping Rogelia, MD;  Location: [facility];  Service: Endoscopy;  Laterality: N/A;  Diabetic - insulin  and oral meds   CORONARY ANGIOPLASTY     CORONARY STENT INTERVENTION N/A 04/25/2018   Procedure: CORONARY STENT INTERVENTION;  Surgeon: Darron Deatrice LABOR, MD;  Location: [facility];  Service: Cardiovascular;  Laterality: N/A;   ESOPHAGOGASTRODUODENOSCOPY (EGD) WITH PROPOFOL  N/A 01/10/2016   Procedure: ESOPHAGOGASTRODUODENOSCOPY (EGD) WITH PROPOFOL ;  Surgeon: Rogelia Copping, MD;  Location: [facility];  Service: Endoscopy;  Laterality: N/A;   ESOPHAGOGASTRODUODENOSCOPY (EGD) WITH PROPOFOL  N/A 04/14/2021   Procedure: ESOPHAGOGASTRODUODENOSCOPY (EGD) WITH BIOPSY;  Surgeon: Copping Rogelia, MD;  Location: [facility];  Service: Endoscopy;  Laterality: N/A;   EYE SURGERY Bilateral 2012   cataract/bilateral vitrectomies   GIVENS CAPSULE STUDY N/A 08/20/2021   Procedure: GIVENS CAPSULE STUDY;  Surgeon: Copping Rogelia, MD;  Location: [facility];  Service: Endoscopy;  Laterality: N/A;   LEFT HEART CATH AND CORONARY ANGIOGRAPHY Left 04/25/2018   Procedure: LEFT HEART CATH AND CORONARY ANGIOGRAPHY;  Surgeon: Darron Deatrice LABOR, MD;  Location: [facility];  Service: Cardiovascular;  Laterality: Left;   LEFT HEART CATH AND CORONARY ANGIOGRAPHY Left  02/02/2022   Procedure: LEFT HEART CATH AND CORONARY ANGIOGRAPHY;  Surgeon: Darron Deatrice LABOR, MD;  Location: [facility];  Service: Cardiovascular;  Laterality: Left;   TEE WITHOUT CARDIOVERSION N/A 09/05/2018   Procedure: TRANSESOPHAGEAL ECHOCARDIOGRAM (TEE);  Surgeon: Darron Deatrice LABOR, MD;  Location: [facility];  Service: Cardiovascular;  Laterality: N/A;   TONSILLECTOMY AND ADENOIDECTOMY     As a child   TUNNELED VENOUS CATHETER PLACEMENT     removed    Family History  Problem Relation Age of Onset   Anxiety disorder Mother    Depression Mother    Diabetes Mother    Heart disease Mother    Cancer Father        Prostate CA, Anal cancer    Dementia Father    Diabetes Brother    Healthy Brother    Healthy Brother     Social History   Tobacco Use   Smoking status: Never   Smokeless tobacco: Never   Tobacco comments:    smoking cessation materials not required  Substance Use Topics   Alcohol use: Not Currently    Alcohol/week: 0.0 standard drinks of alcohol     Current Outpatient Medications:    albuterol  (PROVENTIL ) (2.5 MG/3ML) 0.083% nebulizer solution, Take 3 mLs (2.5 mg total) by nebulization every 6 (six) hours as needed for wheezing  or shortness of breath., Disp: 75 mL, Rfl: 2   albuterol  (VENTOLIN  HFA) 108 (90 Base) MCG/ACT inhaler, Inhale 2 puffs into the lungs daily., Disp: , Rfl:    ammonium lactate  (AMLACTIN) 12 % lotion, Apply 1 Application topically as needed for dry skin., Disp: 400 g, Rfl: 3   aspirin  EC 81 MG tablet, Take 1 tablet (81 mg total) by mouth daily., Disp: 90 tablet, Rfl: 3   clopidogrel  (PLAVIX ) 75 MG tablet, TAKE 1 TABLET BY MOUTH DAILY WITH BREAKFAST, Disp: 90 tablet, Rfl: 3   cyclobenzaprine  (FLEXERIL ) 10 MG tablet, Take 1 tablet (10 mg total) by mouth 2 (two) times daily., Disp: 60 tablet, Rfl: 3   DULoxetine  (CYMBALTA ) 30 MG capsule, Take 1 capsule (30 mg total) by mouth daily., Disp: 90 capsule, Rfl: 1   DULoxetine  (CYMBALTA ) 60 MG  capsule, Take 1 capsule (60 mg total) by mouth daily., Disp: 90 capsule, Rfl: 1   ezetimibe  (ZETIA ) 10 MG tablet, TAKE 1 TABLET(10 MG) BY MOUTH DAILY, Disp: 90 tablet, Rfl: 2   furosemide  (LASIX ) 40 MG tablet, Take 1 tablet (40 mg total) by mouth daily., Disp: 90 tablet, Rfl: 3   gabapentin  (NEURONTIN ) 300 MG capsule, Take 1 capsule (300 mg total) by mouth 3 (three) times daily., Disp: 90 capsule, Rfl: 1   gabapentin  (NEURONTIN ) 800 MG tablet, Take 800 mg by mouth at bedtime., Disp: , Rfl:    insulin  lispro (HUMALOG ) 100 UNIT/ML KiwkPen, Inject 55 Units into the skin daily. On insulin  pump, Disp: , Rfl:    JARDIANCE  10 MG TABS tablet, TAKE 1 TABLET(10 MG) BY MOUTH DAILY BEFORE BREAKFAST, Disp: 90 tablet, Rfl: 3   ketoconazole  (NIZORAL ) 2 % cream, Apply 1 Application topically daily. For rash on face, Disp: 60 g, Rfl: 0   levocetirizine (XYZAL ) 5 MG tablet, Take 1 tablet (5 mg total) by mouth daily at 12 noon., Disp: 90 tablet, Rfl: 1   levothyroxine  (SYNTHROID ) 50 MCG tablet, TAKE 1 TABLET BY MOUTH EVERY DAY ON MONDAY TO SATURDAY AND 2 TABLETS ON SUNDAY, Disp: 102 tablet, Rfl: 0   lubiprostone  (AMITIZA ) 8 MCG capsule, TAKE 1 CAPSULE(8 MCG) BY MOUTH TWICE DAILY WITH A MEAL, Disp: 180 capsule, Rfl: 1   metFORMIN  (GLUCOPHAGE ) 1000 MG tablet, Take 1,000 mg by mouth 2 (two) times daily with a meal., Disp: , Rfl:    montelukast  (SINGULAIR ) 10 MG tablet, Take 10 mg by mouth at bedtime., Disp: , Rfl:    Multiple Vitamins-Minerals (MENS MULTI VITAMIN & MINERAL PO), Take 1 tablet by mouth in the morning and at bedtime., Disp: , Rfl:    naloxone  (NARCAN ) nasal spray 4 mg/0.1 mL, For excess sedation from opioids, Disp: 1 kit, Rfl: 2   nitroGLYCERIN  (NITROSTAT ) 0.3 MG SL tablet, DISSOLVE 1 TABLET UNDER THE TONGUE EVERY 5 MINUTES AS NEEDED FOR CHEST PAIN, MAX 3 DOSES, Disp: 25 tablet, Rfl: 0   Oxycodone  HCl 10 MG TABS, Take 1 tablet (10 mg total) by mouth in the morning, at noon, in the evening, and at bedtime.,  Disp: 120 tablet, Rfl: 0   rosuvastatin  (CRESTOR ) 5 MG tablet, Take 5 mg by mouth daily with supper., Disp: , Rfl: 0   Semaglutide, 2 MG/DOSE, 8 MG/3ML SOPN, Inject into the skin., Disp: , Rfl:    spironolactone  (ALDACTONE ) 25 MG tablet, Take 0.5 tablets (12.5 mg total) by mouth daily., Disp: 45 tablet, Rfl: 3   triamcinolone  cream (KENALOG ) 0.1 %, Apply 1 Application topically 2 (two) times daily as  needed. For rash on arms/legs, Disp: 453.6 g, Rfl: 0  Current Facility-Administered Medications:    sodium chloride  flush (NS) 0.9 % injection 3 mL, 3 mL, Intravenous, Q12H, Furth, Cadence H, PA-C  Allergies  Allergen Reactions   Azathioprine Other (See Comments)    Azathioprine hypersensitivity reaction - Symptoms mimicking sepsis - was hospitalized   Novolog  [Insulin  Aspart (Human Analog) (Yeast)] Hives    I personally reviewed active problem list, medication list, allergies, family history with the patient/caregiver today.   ROS  Ten systems reviewed and is negative except as mentioned in HPI    Objective Physical Exam CONSTITUTIONAL: Patient appears well-developed and well-nourished. No distress. HEENT: Head atraumatic, normocephalic, neck supple. CARDIOVASCULAR: Normal rate, regular rhythm and normal heart sounds. No murmur heard. No BLE edema. PULMONARY: Effort normal and breath sounds normal. Lungs clear to auscultation. No respiratory distress. ABDOMINAL: There is no tenderness or distention. MUSCULOSKELETAL: Normal gait. Without gross motor or sensory deficit. PSYCHIATRIC: Patient has a normal mood and affect. Behavior is normal. Judgment and thought content normal.  Vitals:   07/24/24 1252  BP: 112/74  Pulse: 97  Resp: 16  SpO2: 94%  Weight: 215 lb 12.8 oz (97.9 kg)  Height: 5' 7 (1.702 m)    Body mass index is 33.8 kg/m.  Recent Results (from the past 2160 hours)  B Nat Peptide     Status: None   Collection Time: 04/27/24  1:56 PM  Result Value Ref Range    Brain Natriuretic Peptide 35 <100 pg/mL    Comment: . BNP levels increase with age in the general population with the highest values seen in individuals greater than 7 years of age. Reference: J. Am. Penne. Cardiol. 2002; 59:023-017. SABRA   CBC with Differential/Platelet     Status: Abnormal   Collection Time: 04/27/24  1:56 PM  Result Value Ref Range   WBC 7.2 3.8 - 10.8 Thousand/uL   RBC 4.31 4.20 - 5.80 Million/uL   Hemoglobin 12.7 (L) 13.2 - 17.1 g/dL   HCT 60.4 61.4 - 49.9 %   MCV 91.6 80.0 - 100.0 fL   MCH 29.5 27.0 - 33.0 pg   MCHC 32.2 32.0 - 36.0 g/dL    Comment: For adults, a slight decrease in the calculated MCHC value (in the range of 30 to 32 g/dL) is most likely not clinically significant; however, it should be interpreted with caution in correlation with other red cell parameters and the patient's clinical condition.    RDW 12.9 11.0 - 15.0 %   Platelets 234 140 - 400 Thousand/uL   MPV 10.1 7.5 - 12.5 fL   Neutro Abs 4,522 1,500 - 7,800 cells/uL   Absolute Lymphocytes 1,447 850 - 3,900 cells/uL   Absolute Monocytes 742 200 - 950 cells/uL   Eosinophils Absolute 439 15 - 500 cells/uL   Basophils Absolute 50 0 - 200 cells/uL   Neutrophils Relative % 62.8 %   Total Lymphocyte 20.1 %   Monocytes Relative 10.3 %   Eosinophils Relative 6.1 %   Basophils Relative 0.7 %  Comprehensive metabolic panel with GFR     Status: Abnormal   Collection Time: 04/27/24  1:56 PM  Result Value Ref Range   Glucose, Bld 106 (H) 65 - 99 mg/dL    Comment: .            Fasting reference interval . For someone without known diabetes, a glucose value between 100 and 125 mg/dL is consistent with prediabetes and should  be confirmed with a follow-up test. .    BUN 21 7 - 25 mg/dL   Creat 8.58 (H) 9.29 - 1.35 mg/dL   eGFR 56 (L) > OR = 60 mL/min/1.25m2   BUN/Creatinine Ratio 15 6 - 22 (calc)   Sodium 140 135 - 146 mmol/L   Potassium 4.6 3.5 - 5.3 mmol/L   Chloride 101 98 - 110  mmol/L   CO2 32 20 - 32 mmol/L   Calcium  9.2 8.6 - 10.3 mg/dL   Total Protein 6.2 6.1 - 8.1 g/dL   Albumin 3.9 3.6 - 5.1 g/dL   Globulin 2.3 1.9 - 3.7 g/dL (calc)   AG Ratio 1.7 1.0 - 2.5 (calc)   Total Bilirubin 0.4 0.2 - 1.2 mg/dL   Alkaline phosphatase (APISO) 73 35 - 144 U/L   AST 18 10 - 35 U/L   ALT 15 9 - 46 U/L  Urine Microalbumin w/creat. ratio     Status: None   Collection Time: 05/31/24 10:32 AM  Result Value Ref Range   Creatinine, Urine 60 20 - 320 mg/dL   Microalb, Ur 0.8 mg/dL    Comment: Reference Range Not established    Microalb Creat Ratio 13 <30 mg/g creat    Comment: . The ADA defines abnormalities in albumin excretion as follows: SABRA Albuminuria Category        Result (mg/g creatinine) . Normal to Mildly increased   <30 Moderately increased         30-299  Severely increased           > OR = 300 . The ADA recommends that at least two of three specimens collected within a 3-6 month period be abnormal before considering a patient to be within a diagnostic category.   Iron , TIBC and Ferritin Panel     Status: Abnormal   Collection Time: 05/31/24 10:32 AM  Result Value Ref Range   Iron  56 50 - 180 mcg/dL   TIBC 647 749 - 574 mcg/dL (calc)   %SAT 16 (L) 20 - 48 % (calc)   Ferritin 12 (L) 24 - 380 ng/mL    Diabetic Foot Exam:     PHQ2/9:    07/24/2024   12:57 PM 05/31/2024    9:34 AM 05/16/2024   10:57 AM 03/17/2024    9:05 AM 01/11/2024   11:13 AM  Depression screen PHQ 2/9  Decreased Interest 2 1 0 1   Down, Depressed, Hopeless 2 1 0 1   PHQ - 2 Score 4 2 0 2   Altered sleeping 3 1 0 3   Tired, decreased energy 1 1 0 1   Change in appetite 3 1 0 0   Feeling bad or failure about yourself  0 0 0 0   Trouble concentrating 2 1 0 1   Moving slowly or fidgety/restless 0 0 0 0   Suicidal thoughts 0 0 0 0   PHQ-9 Score 13 6 0 7   Difficult doing work/chores Very difficult Somewhat difficult Not difficult at all Somewhat difficult       Information is confidential and restricted. Go to Review Flowsheets to unlock data.    phq 9 is positive  Fall Risk:    07/24/2024   12:48 PM 05/31/2024    9:34 AM 05/16/2024   10:57 AM 03/17/2024    9:04 AM 12/22/2023    1:21 PM  Fall Risk   Falls in the past year? 0 0 0 0 0  Number  falls in past yr: 0 0 0 0 0  Injury with Fall? 0 0 0 0 0  Risk for fall due to : No Fall Risks No Fall Risks No Fall Risks No Fall Risks   Follow up Falls evaluation completed Falls evaluation completed Falls evaluation completed Education provided;Falls evaluation completed;Falls prevention discussed       Assessment & Plan Recurrent moderate major depressive disorder with bereavement-related exacerbation Exacerbation of depression symptoms post-wife's death, with decreased motivation and concentration. On duloxetine . - Continue duloxetine  60 mg and 30 mg daily. - Prescribe quetiapine at bedtime for sleep and mood stabilization. - Recommend grief counseling through hospice services. - Encourage therapy sessions every two weeks, consider increased frequency or additional grief counseling if needed.  Generalized anxiety disorder Managed with duloxetine , no specific anxiety medication prescribed. - Prescribe buspirone as needed for overwhelming anxiety or emotional distress.  Insomnia Difficulty sleeping, waking at 3 AM, unable to return to sleep. - Prescribe quetiapine at bedtime to maintain sleep.  Type 2 diabetes mellitus with diabetic polyneuropathy Elevated blood sugar levels likely due to stress and lack of sleep. - Encourage hydration and routine establishment. - Advise on the importance of physical activity to manage blood sugar levels.  Chronic right shoulder pain with suspected rotator cuff injury and osteoarthritis Chronic right shoulder pain with limited range of motion, worsening in cold weather. - Refer to a shoulder specialist at [facility] for further evaluation and  management.

## 2024-07-27 ENCOUNTER — Ambulatory Visit: Payer: Medicare Other

## 2024-07-27 ENCOUNTER — Ambulatory Visit: Admitting: Licensed Clinical Social Worker

## 2024-07-27 DIAGNOSIS — F331 Major depressive disorder, recurrent, moderate: Secondary | ICD-10-CM | POA: Diagnosis not present

## 2024-07-27 DIAGNOSIS — Z634 Disappearance and death of family member: Secondary | ICD-10-CM

## 2024-07-27 NOTE — Progress Notes (Signed)
 THERAPIST PROGRESS NOTE  Session Time: 9:03-10am  Participation Level: Active  Behavioral Response: CasualAlertDepressed  Type of Therapy: Individual Therapy  Treatment Goals addressed:   Active     OP Depression     LTG: Reduce frequency, intensity, and duration of depression symptoms so that daily functioning is improved     Start:  07/27/24    Expected End:  10/27/24         LTG: Increase coping skills to manage depression and improve ability to perform daily activities     Start:  07/27/24    Expected End:  10/27/24         STG: Raiquan Chandler will identify cognitive patterns and beliefs that support depression     Start:  07/27/24    Expected End:  10/27/24         Demonstrates progress in stages of grief at own pace     Start:  07/27/24    Expected End:  10/27/24            Work with [name] to identify the major components of a recent episode of depression: physical symptoms, major thoughts and images, and major behaviors they experienced     Start:  07/27/24         Nasier Thumm will identify 2 cognitive distortions they are currently using and write reframing statements to replace them     Start:  07/27/24         Coping Skills     Start:  07/27/24       Will work with the pt using CBT/DBT techniques to help the pt verbalize an understanding of the cognitive, physiological, and behavioral components of depression and its treatment. This will be done by using worksheets, interactive activities, CBT/ABC thought logs, modeling, homework, role playing and journaling. Will work with pt to learn and implement coping skills that result in a reduction of depression and improve daily functioning per pt self-report 3 out of 5 documented sessions.       Educate patient on: Stages of grief     Start:  07/27/24               ProgressTowards Goals: Progressing  Interventions: CBT, Assertiveness Training, Supportive, Reframing, and Other: Grief  work  Summary:[name] is a 63 y.o. male who presents with symptoms of anxiety. Patient reports symptoms to include difficulty falling and staying asleep, avoidance, restlessness, tearfulness, low mood. Pt was oriented times 5. Pt was cooperative and engaged. Pt denies SI/HI/AVH.   The patient reports that he is improving but acknowledges that stressors impact him on days when his tolerance is lower. He identified specific stressors related to going through his late wife's belongings after her death. Additionally, the patient reflected on a recent encounter with his in-laws, during which he was able to use assertive communication to establish boundaries.  This past weekend, the patient and his family planted a tree in honor of his wife in their backyard. In terms of the stages of grief, the patient feels he is moving towards acceptance, noting that his wife had a lower quality of life and it was difficult to witness her struggle with chronic pain daily. He mentioned feeling [employer] guilt due to concerns about others judging him. The clinician worked with the patient to challenge this narrative and identify factors he can control, emphasizing that he can only manage his own actions and responses.  The patient also reflected on his support system,  mentioning that he is growing closer to family but feels overwhelmed by legal matters. The clinician assessed how the patient is continuing to socialize with others and explored his coping mechanisms for the upcoming holiday season.  The patient reported increased symptoms of anxiety and depression, stating that last week he felt in a funk with a lack of motivation every day. As a result, he scheduled an appointment with his primary care provider, who prescribed him antidepressants and medication to aid with sleep at night.  The patient is practicing mindfulness by allowing himself time to grieve and is attempting to change his nighttime routine,  which has been particularly difficult since his wife's passing.  Suicidal/Homicidal: Nowithout intent/plan  Therapist Response: Cln utilized active and supportive reflection to create safe environment for patient to process recent life stressors. Clinician assessed for current symptoms, stressors, safety since last session.  Provided patient the opportunity and space to process his grief.  Explored ways in which patient can allow himself the opportunity to grieve while finding a balance of moving on with day-to-day tasks.  Explored ways in which patient has had to redefine his support system and establish differing boundaries.  Worked with patient to reframe negative cognitions about misplaced guilt.  Plan: Return again in 2 weeks.  Diagnosis: MDD (major depressive disorder), recurrent episode, moderate (HCC)  Recent bereavement   Collaboration of Care: AEB psychiatrist can access notes and cln. Will review psychiatrists' notes. Check in with the patient and will see LCSW per availability. Patient agreed with treatment recommendations.   Patient/Guardian was advised Release of Information must be obtained prior to any record release in order to collaborate their care with an outside provider. Patient/Guardian was advised if they have not already done so to contact the registration department to sign all necessary forms in order for us  to release information regarding their care.   Consent: Patient/Guardian gives verbal consent for treatment and assignment of benefits for services provided during this visit. Patient/Guardian expressed understanding and agreed to proceed.   Evalene KATHEE Husband, LCSW 07/27/2024

## 2024-08-02 ENCOUNTER — Ambulatory Visit

## 2024-08-02 DIAGNOSIS — M19011 Primary osteoarthritis, right shoulder: Secondary | ICD-10-CM

## 2024-08-02 DIAGNOSIS — M25511 Pain in right shoulder: Secondary | ICD-10-CM

## 2024-08-02 DIAGNOSIS — M542 Cervicalgia: Secondary | ICD-10-CM

## 2024-08-02 DIAGNOSIS — G8929 Other chronic pain: Secondary | ICD-10-CM

## 2024-08-02 DIAGNOSIS — M7581 Other shoulder lesions, right shoulder: Secondary | ICD-10-CM | POA: Diagnosis not present

## 2024-08-02 MED ORDER — TRIAMCINOLONE ACETONIDE 40 MG/ML IJ SUSP
40.0000 mg | INTRAMUSCULAR | Status: AC | PRN
  Administered 2024-08-02: 40 mg via INTRA_ARTICULAR

## 2024-08-02 NOTE — Progress Notes (Signed)
 Chief Complaint: Right Shoulder Pain    History of Present Illness:    [name] is a 63 y.o. male with longstanding history of right shoulder pain.  More severe over past year and a half since more recent fall.  Reports two previous falls; 1st fall 25 years ago (fell while carrying a ladder) and 2nd fall 1-1/2 years ago (fell asleep on bench and fell on right shoulder). Describes pain circumferential at the joint. Pain at rest 2-3/10. Pain with range of motion, reaching, twisting shoulder. With movement pain a 7-8/10. Associated numbness (no change from baseline, peripheral neuropathy in hands and feet from DM - on gabapentin ) and weakness. States pain will wake him up from sleeping. Associated posterior shoulder muscle tightness.  Has been using a cane for ambulation assistance (chronic low back pain). Has been using OTC Advil, Voltaren gel, and IcyHot with minimal relief. Patient has previously received PT for neck and back at [facility] PT at the hospital.  Patient last seen by PCP, Dr. Dorette Loron with [facility] on 07/24/2024. Reported worsening of chronic right shoulder pain. Was referred to [facility] for further evaluation and management.  Patient has insulin  dependent T2DM with diabetic polyneuropathy. Last A1c 7.0% on 03/06/2024. Patient also status post bilateral carpal tunnel release (left in 2012 and right in 2013).   Patient managed by Dr. Lynwood Clause with [facility] for chronic pain; lumbar spondylosis with myelopathy, lumbago, cervicalgia, lumbar DDD, and myasthenia gravis. On oxycodone  10mg  qid. Last visit 06/21/2024.  Patient is retired. 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. Patient currently enjoys writing poetry as a hobby. Patient is left hand dominant.    PMH/PSH/Social History/Family History/Allergies/Meds:   Past Medical History:  Diagnosis  Date   Allergy    dust, seasonal (worse in the fall).   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 Followed by Pain Specialist in [place], back and neck   Asthma    BRONCHITIS   Cataract    First Dx in 2012   Chronic combined systolic and diastolic congestive heart failure (HCC)    a. 03/2018 Echo: EF 30-35%; b. 07/2018 Echo: EF 35-40%; c. 09/2019 TEE: EF 40-45%; d. 12/2020 Echo: EF 35-40%; e.01/2022  Echo: EF 45-50%. sev apical HK; f. 01/2023 Echo: EF 40-45%, mod asymm basal-septal LVH, apical AK, nl RV fxn.   CKD (chronic kidney disease), stage II    Coronary artery disease    a. Prior Ant MI->s/p mult stents->LAD/RCA ([place]); b. 2016 Cath: nonobs dzs;  c. 04/2018 Cath/PCI: LCX 39m (3.25x15 Sierra DES); d. 02/2022 MV: high risk; e. 02/2022 Cath: LM nl, LAD 20p ISR, patent mid-stent, LCX patent stent, OM1 nl, OM2 50p, patent stent, OM3 40p, patent stent, RCA 40p ISR, 75m ISR, 107m/d, patent distal stent, RPDA patent stent, RPAV small, 80 (sl progression)-->Med Rx.   Deaf, left    Diabetes mellitus without complication (HCC)    TYPE 2   Diabetic peripheral neuropathy (HCC)    feet and hands   FUO (fever of unknown origin) 08/03/2018   GERD (gastroesophageal reflux disease)    Headache    muscle tension   Hyperlipidemia    Hypertension    Hyperthyroidism    Insomnia    Ischemic cardiomyopathy    a. 03/2018 Echo: EF 30-35%; b. 07/2018 Echo: EF 35-40%, Gr1 DD; c. 09/2019 TEE: EF 40-45%;  d. 12/2020 Echo: EF 35-40%. GrI DD; e. 01/2022 Echo: EF 45-50%; f. 01/2023 Echo: EF 40-45%.   Knee pain, acute 05/06/2020   Left arm weakness 10/04/2019   Left leg weakness 12/01/2019   Lyme disease    Chronic   Myasthenia gravis (HCC)    (03/18/21 - no current treatment - better than it has ever been per pt)   Myocardial infarction (HCC) 2010   Palpitations    a. 10/2020 Zio: RSR, 88 avg. 3 brief SVT episodes (max 5 beats @ 128). Rare PACs/PVCs. Triggered events did not correlate w/ significant arrthymia - some w/  sinus tach.   Seasonal allergies    Sepsis (HCC)    a.07/2018 - unknown source. TEE neg for veg 09/2019.   Sleep apnea    CPAP   Wears hearing aid in both ears    Past Surgical History:  Procedure Laterality Date   BILATERAL CARPAL TUNNEL RELEASE Bilateral L in 2012 and R in 2013   CARDIAC CATHETERIZATION     Several Caths, most recent in  March 2016.   COLONOSCOPY WITH PROPOFOL  N/A 01/10/2016   Procedure: COLONOSCOPY WITH PROPOFOL ;  Surgeon: Rogelia Copping, MD;  Location: [facility] ENDOSCOPY;  Service: Endoscopy;  Laterality: N/A;   COLONOSCOPY WITH PROPOFOL  N/A 04/14/2021   Procedure: COLONOSCOPY WITH PROPOFOL ;  Surgeon: Copping Rogelia, MD;  Location: [facility];  Service: Endoscopy;  Laterality: N/A;  Diabetic - insulin  and oral meds   CORONARY ANGIOPLASTY     CORONARY STENT INTERVENTION N/A 04/25/2018   Procedure: CORONARY STENT INTERVENTION;  Surgeon: Darron Deatrice LABOR, MD;  Location: [facility] INVASIVE CV LAB;  Service: Cardiovascular;  Laterality: N/A;   ESOPHAGOGASTRODUODENOSCOPY (EGD) WITH PROPOFOL  N/A 01/10/2016   Procedure: ESOPHAGOGASTRODUODENOSCOPY (EGD) WITH PROPOFOL ;  Surgeon: Rogelia Copping, MD;  Location: [facility] ENDOSCOPY;  Service: Endoscopy;  Laterality: N/A;   ESOPHAGOGASTRODUODENOSCOPY (EGD) WITH PROPOFOL  N/A 04/14/2021   Procedure: ESOPHAGOGASTRODUODENOSCOPY (EGD) WITH BIOPSY;  Surgeon: Copping Rogelia, MD;  Location: [facility];  Service: Endoscopy;  Laterality: N/A;   EYE SURGERY Bilateral 2012   cataract/bilateral vitrectomies   GIVENS CAPSULE STUDY N/A 08/20/2021   Procedure: GIVENS CAPSULE STUDY;  Surgeon: Copping Rogelia, MD;  Location: [facility] ENDOSCOPY;  Service: Endoscopy;  Laterality: N/A;   LEFT HEART CATH AND CORONARY ANGIOGRAPHY Left 04/25/2018   Procedure: LEFT HEART CATH AND CORONARY ANGIOGRAPHY;  Surgeon: Darron Deatrice LABOR, MD;  Location: [facility] INVASIVE CV LAB;  Service: Cardiovascular;  Laterality: Left;   LEFT HEART CATH AND CORONARY ANGIOGRAPHY Left 02/02/2022    Procedure: LEFT HEART CATH AND CORONARY ANGIOGRAPHY;  Surgeon: Darron Deatrice LABOR, MD;  Location: [facility] INVASIVE CV LAB;  Service: Cardiovascular;  Laterality: Left;   TEE WITHOUT CARDIOVERSION N/A 09/05/2018   Procedure: TRANSESOPHAGEAL ECHOCARDIOGRAM (TEE);  Surgeon: Darron Deatrice LABOR, MD;  Location: [facility] ORS;  Service: Cardiovascular;  Laterality: N/A;   TONSILLECTOMY AND ADENOIDECTOMY     As a child   TUNNELED VENOUS CATHETER PLACEMENT     removed   Social History   Socioeconomic History   Marital status: Widowed    Spouse name: Barnie   Number of children: 0   Years of education: Not on file   Highest education level: Doctorate  Occupational History   Occupation: disabled    Comment: multiple medical problems - first approved for uncontrolled DM, but now has heart disease and Myasthenia Gravis   Tobacco Use   Smoking status: Never   Smokeless tobacco: Never   Tobacco comments:  smoking cessation materials not required  Vaping Use   Vaping status: Never Used  Substance and Sexual Activity   Alcohol use: Not Currently    Alcohol/week: 0.0 standard drinks of alcohol   Drug use: No   Sexual activity: Not Currently    Partners: Female  Other Topics Concern   Not on file  Social History Narrative   Not on file   Social Drivers of Health   Financial Resource Strain: Medium Risk (07/24/2024)   Overall Financial Resource Strain (CARDIA)    Difficulty of Paying Living Expenses: Somewhat hard  Food Insecurity: No Food Insecurity (07/24/2024)   Hunger Vital Sign    Worried About Running Out of Food in the Last Year: Never true    Ran Out of Food in the Last Year: Never true  Transportation Needs: No Transportation Needs (07/24/2024)   PRAPARE - Administrator, Civil Service (Medical): No    Lack of Transportation (Non-Medical): No  Physical Activity: Insufficiently Active (07/24/2024)   Exercise Vital Sign    Days of Exercise per Week: 3 days    Minutes of  Exercise per Session: 20 min  Stress: Stress Concern Present (07/24/2024)   [employer] of Occupational Health - Occupational Stress Questionnaire    Feeling of Stress: Very much  Social Connections: Moderately Isolated (07/24/2024)   Social Connection and Isolation Panel    Frequency of Communication with Friends and Family: Three times a week    Frequency of Social Gatherings with Friends and Family: Never    Attends Religious Services: 1 to 4 times per year    Active Member of [employer] or Organizations: No    Attends Banker Meetings: Not on file    Marital Status: Widowed   Family History  Problem Relation Age of Onset   Anxiety disorder Mother    Depression Mother    Diabetes Mother    Heart disease Mother    Cancer Father        Prostate CA, Anal cancer    Dementia Father    Diabetes Brother    Healthy Brother    Healthy Brother    Allergies  Allergen Reactions   Azathioprine Other (See Comments)    Azathioprine hypersensitivity reaction - Symptoms mimicking sepsis - was hospitalized   Novolog  [Insulin  Aspart (Human Analog) (Yeast)] Hives   Current Outpatient Medications  Medication Sig Dispense Refill   albuterol  (PROVENTIL ) (2.5 MG/3ML) 0.083% nebulizer solution Take 3 mLs (2.5 mg total) by nebulization every 6 (six) hours as needed for wheezing or shortness of breath. 75 mL 2   albuterol  (VENTOLIN  HFA) 108 (90 Base) MCG/ACT inhaler Inhale 2 puffs into the lungs daily.     ammonium lactate  (AMLACTIN) 12 % lotion Apply 1 Application topically as needed for dry skin. 400 g 3   aspirin  EC 81 MG tablet Take 1 tablet (81 mg total) by mouth daily. 90 tablet 3   busPIRone (BUSPAR) 5 MG tablet Take 1 tablet (5 mg total) by mouth 3 (three) times daily with meals as needed. 60 tablet 0   clopidogrel  (PLAVIX ) 75 MG tablet TAKE 1 TABLET BY MOUTH DAILY WITH BREAKFAST 90 tablet 3   cyclobenzaprine  (FLEXERIL ) 10 MG tablet Take 1 tablet (10 mg total) by mouth 2 (two)  times daily. 60 tablet 3   DULoxetine  (CYMBALTA ) 30 MG capsule Take 1 capsule (30 mg total) by mouth daily. 90 capsule 1   DULoxetine  (CYMBALTA ) 60 MG capsule Take 1 capsule (  60 mg total) by mouth daily. 90 capsule 1   ezetimibe  (ZETIA ) 10 MG tablet TAKE 1 TABLET(10 MG) BY MOUTH DAILY 90 tablet 2   furosemide  (LASIX ) 40 MG tablet Take 1 tablet (40 mg total) by mouth daily. 90 tablet 3   gabapentin  (NEURONTIN ) 300 MG capsule Take 1 capsule (300 mg total) by mouth 3 (three) times daily. 90 capsule 1   gabapentin  (NEURONTIN ) 800 MG tablet Take 800 mg by mouth at bedtime.     insulin  lispro (HUMALOG ) 100 UNIT/ML KiwkPen Inject 55 Units into the skin daily. On insulin  pump     JARDIANCE  10 MG TABS tablet TAKE 1 TABLET(10 MG) BY MOUTH DAILY BEFORE BREAKFAST 90 tablet 3   ketoconazole  (NIZORAL ) 2 % cream Apply 1 Application topically daily. For rash on face 60 g 0   levocetirizine (XYZAL ) 5 MG tablet Take 1 tablet (5 mg total) by mouth daily at 12 noon. 90 tablet 1   levothyroxine  (SYNTHROID ) 50 MCG tablet TAKE 1 TABLET BY MOUTH EVERY DAY ON MONDAY TO SATURDAY AND 2 TABLETS ON SUNDAY 102 tablet 0   lubiprostone  (AMITIZA ) 8 MCG capsule TAKE 1 CAPSULE(8 MCG) BY MOUTH TWICE DAILY WITH A MEAL 180 capsule 1   metFORMIN  (GLUCOPHAGE ) 1000 MG tablet Take 1,000 mg by mouth 2 (two) times daily with a meal.     montelukast  (SINGULAIR ) 10 MG tablet Take 10 mg by mouth at bedtime.     Multiple Vitamins-Minerals (MENS MULTI VITAMIN & MINERAL PO) Take 1 tablet by mouth in the morning and at bedtime.     naloxone  (NARCAN ) nasal spray 4 mg/0.1 mL For excess sedation from opioids 1 kit 2   nitroGLYCERIN  (NITROSTAT ) 0.3 MG SL tablet DISSOLVE 1 TABLET UNDER THE TONGUE EVERY 5 MINUTES AS NEEDED FOR CHEST PAIN, MAX 3 DOSES 25 tablet 0   Oxycodone  HCl 10 MG TABS Take 1 tablet (10 mg total) by mouth in the morning, at noon, in the evening, and at bedtime. 120 tablet 0   QUEtiapine (SEROQUEL) 25 MG tablet Take 1 tablet (25 mg  total) by mouth at bedtime. 30 tablet 0   rosuvastatin  (CRESTOR ) 5 MG tablet Take 5 mg by mouth daily with supper.  0   Semaglutide, 2 MG/DOSE, 8 MG/3ML SOPN Inject into the skin.     spironolactone  (ALDACTONE ) 25 MG tablet Take 0.5 tablets (12.5 mg total) by mouth daily. 45 tablet 3   triamcinolone  cream (KENALOG ) 0.1 % Apply 1 Application topically 2 (two) times daily as needed. For rash on arms/legs 453.6 g 0   Current Facility-Administered Medications  Medication Dose Route Frequency Provider Last Rate Last Admin   sodium chloride  flush (NS) 0.9 % injection 3 mL  3 mL Intravenous Q12H Furth, Cadence H, PA-C       No results found.  I have reviewed past medical, surgical, social and family history, medications, and allergies as documented in the EMR.   Review of Systems:    A ROS was performed including pertinent positives and negatives as documented in the HPI.   Physical Exam :    General/Constitutional: NAD and appears stated age Vascular: No edema, swelling or tenderness, except as noted in detailed exam Integumentary: No impressive skin lesions present, except as noted in detailed exam Neurological: Alert and oriented Psych: Appropriate affect and cooperative Musculoskeletal: Normal, except as noted in detailed exam and HPI There were no vitals taken for this visit.    Focused Orthopedic Exam:    Neck focused  exam: Palpation: Minimal tenderness with palpation along midline cervical spine. No palpable stepoff, deformity or crepitus along spinous processes. Mild tenderness with palpation over right sided paraspinal musculature. No palpable muscle tightness/spasm. ROM: within normal limits in flexion, extension, rotation and side-bending Spurling's negative   Shoulder focused exam:    RIGHT LEFT  Scapula Atrophy None None   Winging None None  Rotator cuff Supraspinatus 5/5 5/5   Infraspinatus 5/5 5/5   Subscapularis 5/5 5/5  AROM/PROM (degrees) FF 0-100 / 0-145 0-170  / 0-170   ER0 0-35 / 0-45 0-60 / 0-60   IR(back) L4 / L4 T10 / T10  Palpation (pain): AC positive negative   Biceps negative negative   Coracoid negative negative  Special Tests: O'Briens positive negative   Mayo-shear Positive Pain - no click negative   Cross body Adduction positive negative   Speeds  negative negative   Jobe's negative negative   Neer positive negative   Hawkins positive negative   Belly Press negative negative   Hornblower's negative negative  Other: Sulcus sign negative negative   Lateral deltoid 5/5 5/5    Vascular/Lymphatic: Fingers warm and well perfused with 2+ radial pulse.    Neurologic: Sensation intact to the Median, Ulnar and Radial nerve distribution of the hand. Subjective tingling/numbness in fingertips, equal bilaterally, chronic. Sensation intact to lateral deltoid (axillary nerve).      Imaging:    Xray (Cervical Spine): Xray including 2-views (AP, lateral) obtained today 08/02/2024 at [facility] Neurosurgery at [facility] Imaging were reviewed personally by me. Per my independent interpretation these images show no acute fracture. No significant spondylolisthesis. Preserved disc space. Mild endplate changes on C3, C4, and C5. Mild degenerative changes at facet joints with foraminal narrowing, C4-C5. Mild thyroid  and cricoid cartilage (laryngeal) calcification noted.  Xray (Right Shoulder): Xray including 2-views (AP, grashey, scapular y, axillary) obtained today 08/02/2024 at [facility]surgery at [facility] were reviewed personally by me. Per my independent interpretation these images demonstrate degenerative changes about the acromioclavicular joint with distal clavicle osteophyte.  Mild downward sloping acromion.  Mild degenerative changes noted about the glenohumeral joint mainly along the glenoid rim. No acute fracture. No dislocation. No soft tissue abnormality.       Assessment and Plan:    Right shoulder rotator cuff  tendinitis Right acromioclavicular joint osteoarthritis   63 y.o. male with 1-1/2-year history of worsening right shoulder pain.  Precipitated by fall injury, from 2 to 3 feet, onto lateral aspect of right shoulder.  Physical exam reveals pain with use of rotator cuff, specifically supraspinatus, and positive impingement testing.  Right shoulder imaging/x-ray reveals mild arthritic changes at glenohumeral joint and moderate arthritic changes at [facility] joint.  Patient has failed over-the-counter NSAIDs and topical Voltaren gel and IcyHot.  Patient on chronic opioids for LBP per pain management.  Suspect patient's symptoms are most likely from irritation of the subacromial space.  Discussed conservative treatment including continuation of rest, ice/heat, corticosteroid injection, formal physical therapy.  Patient interested in corticosteroid injection.  Subacromial injection provided in office today.  Patient tolerated procedure well.  Discussed risks including transient elevation in glucose (patient has insulin  dependent diabetes, last A1c 7.0% five months ago, patient has previously received steroid injections and has tolerated them well).  Patient interested in physical therapy.  Has previously had success with physical therapy at [facility] for neck and back.  Referral placed.  Advised patient return to clinic in 6 to 8 weeks for reevaluation.  Lucie Stabs PA-C Orthopedic Surgery & Sports Medicine [facility] with [facility]   I personally saw and evaluated the patient, and participated in the management and treatment plan.  Arlyss GEANNIE Schneider DO Orthopedic Surgery & Sports Medicine [facility] with [facility]   Large Joint Inj: R subacromial bursa on 08/02/2024 1:49 PM Indications: pain Details: (21 G) 1.5 in needle, posterior approach Medications: 40 mg triamcinolone  acetonide 40 MG/ML (4 mL ropivicaine 0.5%) Outcome: tolerated well, no immediate complications Procedure, treatment  alternatives, risks and benefits explained, specific risks discussed. Consent was given by the patient. Immediately prior to procedure a time out was called to verify the correct patient, procedure, equipment, support staff and site/side marked as required. Patient was prepped and draped in the usual sterile fashion.    Right shoulder subacromial injection procedure: The risks and benefits of a cortisone injection were discussed and the patient wishes to proceed.  The posterior aspect of the right shoulder was cleansed with alcohol swabs and ChloraPrep.  The skin was flash cooled with Ethyl Chloride, and using sterile technique, a 21 gauge needle was immediately introduced into the subacromial space.  After aspiration revealed no backfill of blood, the injectate which consisted of 1cc of 40 mg/mL of Kenalog  and 4cc of 0.5% ropivacaine  easily flowed into the space.  The needle was withdrawn and pressure was applied for hemostasis.  A bandage was applied.  The patient tolerated the procedure well. Patient instructed to ice the area tonight if sore.  Cortisone Injections You have received a cortisone injection today. This injection consists of a numbing medication and cortisone.  There may be side effects after receiving the injection.   If you are diabetic: Your blood sugar may increase for up to 48 hours.  Check it more often than normal.   General reactions: A cortisone flare reaction. This generally occurs 6-8 hours after receiving the injection.  Ice the area and take Tylenol  for pain.  If the pain lasts longer than 48 hours call the office.   Some patients may experience flushing, increased heart rate, red face or increase in body temperature.  This is rare but can happen.   Over the counter Benadryl, if appropriate, often reduces these symptoms.  For a severe reaction contact your family doctor or go to the emergency room.  If you have any other questions, feel free to ask.    This document  was dictated using Conservation officer, historic buildings. A reasonable attempt at proof reading has been made to minimize errors.

## 2024-08-02 NOTE — Patient Instructions (Signed)
 [facility] [address], [place] [address] [phone]   Thank you for visiting the office today. We appreciate your trust and allowing us  to help you with your orthopedic needs.  Please do not hesitate to call if you have further questions or concerns following your visit with us . If you experience life-threatening symptoms or it cannot wait until normal office hours, please go to the nearest Emergency Department for immediate evaluation.      Cortisone Injection Patient Information  -A cortisone injection has been recommended for you today during your office visit. The injection consists of two medications administered at the same time. The first medication is a numbing medication. This medication will help you to feel better today for a couple hours.  However, this medication will wear off today and your discomfort may return. The second medication is the steroid medication. This medication will usually take a few days for full effect.   -Some individuals, about 1 in 20, can have an increase in their pain after the injection for approximately 24-36 hours. This is called a steroid flare and may be associated with some slight flushing of the face. The best thing to do if this occurs is to ice the area as much as possible and take ibuprofen if you are able.   -Any patients who are currently receiving treatment for problems with their blood sugar should be aware that this medication may cause you to have elevated blood sugar. This will typically occur for 24-36 hours after the injection. We recommend that you check your blood sugar regularly during this time and contact your primary care provider immediately or go to the nearest emergency room if the levels get to high.

## 2024-08-04 ENCOUNTER — Ambulatory Visit

## 2024-08-04 VITALS — BP 110/82 | Ht 67.0 in | Wt 210.0 lb

## 2024-08-04 DIAGNOSIS — Z0000 Encounter for general adult medical examination without abnormal findings: Secondary | ICD-10-CM

## 2024-08-04 NOTE — Patient Instructions (Signed)
 [name],  Thank you for taking the time for your Medicare Wellness Visit. I appreciate your continued commitment to your health goals. Please review the care plan we discussed, and feel free to reach out if I can assist you further.  Medicare recommends these wellness visits once per year to help you and your care team stay ahead of potential health issues. These visits are designed to focus on prevention, allowing your provider to concentrate on managing your acute and chronic conditions during your regular appointments.  Please note that Annual Wellness Visits do not include a physical exam. Some assessments may be limited, especially if the visit was conducted virtually. If needed, we may recommend a separate in-person follow-up with your provider.  Ongoing Care Seeing your primary care provider every 3 to 6 months helps us  monitor your health and provide consistent, personalized care.   Referrals If a referral was made during today's visit and you haven't received any updates within two weeks, please contact the referred provider directly to check on the status.  Recommended Screenings:  Health Maintenance  Topic Date Due   DTaP/Tdap/Td vaccine (2 - Tdap) 04/05/2023   COVID-19 Vaccine (7 - 2025-26 season) 06/05/2024   Zoster (Shingles) Vaccine (1 of 2) 08/31/2024*   Hemoglobin A1C  09/05/2024   Eye exam for diabetics  01/04/2025   Complete foot exam   03/08/2025   Yearly kidney function blood test for diabetes  04/27/2025   Yearly kidney health urinalysis for diabetes  05/31/2025   Medicare Annual Wellness Visit  08/04/2025   Colon Cancer Screening  04/14/2026   Pneumococcal Vaccine for age over 78  Completed   Flu Shot  Completed   Hepatitis C Screening  Completed   HIV Screening  Completed   Hepatitis B Vaccine  Aged Out   Meningitis B Vaccine  Aged Out  *Topic was postponed. The date shown is not the original due date.       08/04/2024   11:45 AM  Advanced Directives   Does Patient Have a Medical Advance Directive? No  Would patient like information on creating a medical advance directive? No - Patient declined   Advance Care Planning is important because it: Ensures you receive medical care that aligns with your values, goals, and preferences. Provides guidance to your family and loved ones, reducing the emotional burden of decision-making during critical moments.  Vision: Annual vision screenings are recommended for early detection of glaucoma, cataracts, and diabetic retinopathy. These exams can also reveal signs of chronic conditions such as diabetes and high blood pressure.  Dental: Annual dental screenings help detect early signs of oral cancer, gum disease, and other conditions linked to overall health, including heart disease and diabetes.  Please see the attached documents for additional preventive care recommendations.

## 2024-08-04 NOTE — Progress Notes (Signed)
 Because this visit was a virtual/telehealth visit,  certain criteria was not obtained, such a blood pressure, CBG if applicable, and timed get up and go. Any medications not marked as taking were not mentioned during the medication reconciliation part of the visit. Any vitals not documented were not able to be obtained due to this being a telehealth visit or patient was unable to self-report a recent blood pressure reading due to a lack of equipment at home via telehealth. Vitals that have been documented are verbally provided by the patient.  This visit was performed by a medical professional under my direct supervision. I was immediately available for consultation/collaboration. I have reviewed and agree with the Annual Wellness Visit documentation.  Subjective:   [name] is a 63 y.o. who presents for a Medicare Wellness preventive visit.  As a reminder, Annual Wellness Visits don't include a physical exam, and some assessments may be limited, especially if this visit is performed virtually. We may recommend an in-person follow-up visit with your provider if needed.  Visit Complete: Virtual I connected with  [name] on 08/04/24 by a audio enabled telemedicine application and verified that I am speaking with the correct person using two identifiers.  Patient Location: Home  Provider Location: Home Office  I discussed the limitations of evaluation and management by telemedicine. The patient expressed understanding and agreed to proceed.  Vital Signs: Because this visit was a virtual/telehealth visit, some criteria may be missing or patient reported. Any vitals not documented were not able to be obtained and vitals that have been documented are patient reported.  VideoDeclined- This patient declined Librarian, academic. Therefore the visit was completed with audio only.  Persons Participating in Visit: Patient.  AWV Questionnaire: No: Patient  Medicare AWV questionnaire was not completed prior to this visit.  Cardiac Risk Factors include: advanced age (>47men, >75 women);male gender;obesity (BMI >30kg/m2);diabetes mellitus;hypertension     Objective:    Today's Vitals   08/04/24 1139 08/04/24 1141  BP: 110/82   Weight: 210 lb (95.3 kg)   Height: 5' 7 (1.702 m)   PainSc:  3    Body mass index is 32.89 kg/m.     08/04/2024   11:45 AM 12/22/2023    1:21 PM 12/03/2023    9:44 AM 09/21/2023    2:41 PM 07/23/2023    1:38 PM 07/21/2023    1:14 PM 03/24/2023    1:40 PM  Advanced Directives  Does Patient Have a Medical Advance Directive? No Yes Yes Yes Yes  Yes  Type of Advance Directive  Living will Healthcare Power of [employer];Living will Healthcare Power of [place];Living will Healthcare Power of [place];Living will    Copy of Healthcare Power of Attorney in Chart?  No - copy requested No - copy requested  No - copy requested    Would patient like information on creating a medical advance directive? No - Patient declined           Information is confidential and restricted. Go to Review Flowsheets to unlock data.    Current Medications (verified) Outpatient Encounter Medications as of 08/04/2024  Medication Sig   albuterol  (PROVENTIL ) (2.5 MG/3ML) 0.083% nebulizer solution Take 3 mLs (2.5 mg total) by nebulization every 6 (six) hours as needed for wheezing or shortness of breath.   albuterol  (VENTOLIN  HFA) 108 (90 Base) MCG/ACT inhaler Inhale 2 puffs into the lungs daily.   ammonium lactate  (AMLACTIN) 12 % lotion Apply 1 Application topically as  needed for dry skin.   aspirin  EC 81 MG tablet Take 1 tablet (81 mg total) by mouth daily.   busPIRone (BUSPAR) 5 MG tablet Take 1 tablet (5 mg total) by mouth 3 (three) times daily with meals as needed.   clopidogrel  (PLAVIX ) 75 MG tablet TAKE 1 TABLET BY MOUTH DAILY WITH BREAKFAST   DULoxetine  (CYMBALTA ) 30 MG capsule Take 1 capsule (30 mg total) by mouth daily.    DULoxetine  (CYMBALTA ) 60 MG capsule Take 1 capsule (60 mg total) by mouth daily.   ezetimibe  (ZETIA ) 10 MG tablet TAKE 1 TABLET(10 MG) BY MOUTH DAILY   furosemide  (LASIX ) 40 MG tablet Take 1 tablet (40 mg total) by mouth daily.   gabapentin  (NEURONTIN ) 300 MG capsule Take 1 capsule (300 mg total) by mouth 3 (three) times daily.   gabapentin  (NEURONTIN ) 800 MG tablet Take 800 mg by mouth at bedtime.   insulin  lispro (HUMALOG ) 100 UNIT/ML KiwkPen Inject 55 Units into the skin daily. On insulin  pump   JARDIANCE  10 MG TABS tablet TAKE 1 TABLET(10 MG) BY MOUTH DAILY BEFORE BREAKFAST   ketoconazole  (NIZORAL ) 2 % cream Apply 1 Application topically daily. For rash on face   levocetirizine (XYZAL ) 5 MG tablet Take 1 tablet (5 mg total) by mouth daily at 12 noon.   levothyroxine  (SYNTHROID ) 50 MCG tablet TAKE 1 TABLET BY MOUTH EVERY DAY ON MONDAY TO SATURDAY AND 2 TABLETS ON SUNDAY   lubiprostone  (AMITIZA ) 8 MCG capsule TAKE 1 CAPSULE(8 MCG) BY MOUTH TWICE DAILY WITH A MEAL   metFORMIN  (GLUCOPHAGE ) 1000 MG tablet Take 1,000 mg by mouth 2 (two) times daily with a meal.   montelukast  (SINGULAIR ) 10 MG tablet Take 10 mg by mouth at bedtime.   Multiple Vitamins-Minerals (MENS MULTI VITAMIN & MINERAL PO) Take 1 tablet by mouth in the morning and at bedtime.   naloxone  (NARCAN ) nasal spray 4 mg/0.1 mL For excess sedation from opioids   nitroGLYCERIN  (NITROSTAT ) 0.3 MG SL tablet DISSOLVE 1 TABLET UNDER THE TONGUE EVERY 5 MINUTES AS NEEDED FOR CHEST PAIN, MAX 3 DOSES   Oxycodone  HCl 10 MG TABS Take 1 tablet (10 mg total) by mouth in the morning, at noon, in the evening, and at bedtime.   QUEtiapine (SEROQUEL) 25 MG tablet Take 1 tablet (25 mg total) by mouth at bedtime.   rosuvastatin  (CRESTOR ) 5 MG tablet Take 5 mg by mouth daily with supper.   Semaglutide, 2 MG/DOSE, 8 MG/3ML SOPN Inject into the skin.   spironolactone  (ALDACTONE ) 25 MG tablet Take 0.5 tablets (12.5 mg total) by mouth daily.   triamcinolone   cream (KENALOG ) 0.1 % Apply 1 Application topically 2 (two) times daily as needed. For rash on arms/legs   cyclobenzaprine  (FLEXERIL ) 10 MG tablet Take 1 tablet (10 mg total) by mouth 2 (two) times daily.   Facility-Administered Encounter Medications as of 08/04/2024  Medication   sodium chloride  flush (NS) 0.9 % injection 3 mL    Allergies (verified) Azathioprine and Novolog  [insulin  aspart (human analog) (yeast)]   History: Past Medical History:  Diagnosis Date   Allergy    dust, seasonal (worse in the fall).   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 Followed by Pain Specialist in [place], back and neck   Asthma    BRONCHITIS   Cataract    First Dx in 2012   Chronic combined systolic and diastolic congestive heart failure (HCC)    a. 03/2018 Echo: EF 30-35%; b. 07/2018 Echo:  EF 35-40%; c. 09/2019 TEE: EF 40-45%; d. 12/2020 Echo: EF 35-40%; e.01/2022  Echo: EF 45-50%. sev apical HK; f. 01/2023 Echo: EF 40-45%, mod asymm basal-septal LVH, apical AK, nl RV fxn.   CKD (chronic kidney disease), stage II    Coronary artery disease    a. Prior Ant MI->s/p mult stents->LAD/RCA ([place]); b. 2016 Cath: nonobs dzs;  c. 04/2018 Cath/PCI: LCX 20m (3.25x15 Sierra DES); d. 02/2022 MV: high risk; e. 02/2022 Cath: LM nl, LAD 20p ISR, patent mid-stent, LCX patent stent, OM1 nl, OM2 50p, patent stent, OM3 40p, patent stent, RCA 40p ISR, 26m ISR, 63m/d, patent distal stent, RPDA patent stent, RPAV small, 80 (sl progression)-->Med Rx.   Deaf, left    Diabetes mellitus without complication (HCC)    TYPE 2   Diabetic peripheral neuropathy (HCC)    feet and hands   FUO (fever of unknown origin) 08/03/2018   GERD (gastroesophageal reflux disease)    Headache    muscle tension   Hyperlipidemia    Hypertension    Hyperthyroidism    Insomnia    Ischemic cardiomyopathy    a. 03/2018 Echo: EF 30-35%; b. 07/2018 Echo: EF 35-40%, Gr1 DD; c. 09/2019 TEE: EF 40-45%; d. 12/2020 Echo: EF 35-40%. GrI DD; e. 01/2022 Echo: EF  45-50%; f. 01/2023 Echo: EF 40-45%.   Knee pain, acute 05/06/2020   Left arm weakness 10/04/2019   Left leg weakness 12/01/2019   Lyme disease    Chronic   Myasthenia gravis (HCC)    (03/18/21 - no current treatment - better than it has ever been per pt)   Myocardial infarction (HCC) 2010   Palpitations    a. 10/2020 Zio: RSR, 88 avg. 3 brief SVT episodes (max 5 beats @ 128). Rare PACs/PVCs. Triggered events did not correlate w/ significant arrthymia - some w/ sinus tach.   Seasonal allergies    Sepsis (HCC)    a.07/2018 - unknown source. TEE neg for veg 09/2019.   Sleep apnea    CPAP   Wears hearing aid in both ears    Past Surgical History:  Procedure Laterality Date   BILATERAL CARPAL TUNNEL RELEASE Bilateral L in 2012 and R in 2013   CARDIAC CATHETERIZATION     Several Caths, most recent in  March 2016.   COLONOSCOPY WITH PROPOFOL  N/A 01/10/2016   Procedure: COLONOSCOPY WITH PROPOFOL ;  Surgeon: Rogelia Copping, MD;  Location: [facility] END[facility];  Service: Endoscopy;  Laterality: N/A;   COLONOSCOPY WITH PROPOFOL  N/A 04/14/2021   Procedure: COLONOSCOPY WITH PROPOFOL ;  Surgeon: Copping Rogelia, MD;  Location: [facility];  Service: Endoscopy;  Laterality: N/A;  Diabetic - insulin  and oral meds   CORONARY ANGIOPLASTY     CORONARY STENT INTERVENTION N/A 04/25/2018   Procedure: CORONARY STENT INTERVENTION;  Surgeon: Darron Deatrice LABOR, MD;  Location: [facility];  Service: Cardiovascular;  Laterality: N/A;   ESOPHAGOGASTRODUODENOSCOPY (EGD) WITH PROPOFOL  N/A 01/10/2016   Procedure: ESOPHAGOGASTRODUODENOSCOPY (EGD) WITH PROPOFOL ;  Surgeon: Rogelia Copping, MD;  Location: [facility] END[facility];  Service: Endoscopy;  Laterality: N/A;   ESOPHAGOGASTRODUODENOSCOPY (EGD) WITH PROPOFOL  N/A 04/14/2021   Procedure: ESOPHAGOGASTRODUODENOSCOPY (EGD) WITH BIOPSY;  Surgeon: Copping Rogelia, MD;  Location: [facility];  Service: Endoscopy;  Laterality: N/A;   EYE SURGERY Bilateral 2012   cataract/bilateral  vitrectomies   GIVENS CAPSULE STUDY N/A 08/20/2021   Procedure: GIVENS CAPSULE STUDY;  Surgeon: Copping Rogelia, MD;  Location: [facility] ENDOSCOPY;  Service: Endoscopy;  Laterality: N/A;   LEFT HEART  CATH AND CORONARY ANGIOGRAPHY Left 04/25/2018   Procedure: LEFT HEART CATH AND CORONARY ANGIOGRAPHY;  Surgeon: Darron Deatrice LABOR, MD;  Location: [facility];  Service: Cardiovascular;  Laterality: Left;   LEFT HEART CATH AND CORONARY ANGIOGRAPHY Left 02/02/2022   Procedure: LEFT HEART CATH AND CORONARY ANGIOGRAPHY;  Surgeon: Darron Deatrice LABOR, MD;  Location: [facility];  Service: Cardiovascular;  Laterality: Left;   TEE WITHOUT CARDIOVERSION N/A 09/05/2018   Procedure: TRANSESOPHAGEAL ECHOCARDIOGRAM (TEE);  Surgeon: Darron Deatrice LABOR, MD;  Location: [facility] OR[facility];  Service: Cardiovascular;  Laterality: N/A;   TONSILLECTOMY AND ADENOIDECTOMY     As a child   TUNNELED VENOUS CATHETER PLACEMENT     removed   Family History  Problem Relation Age of Onset   Anxiety disorder Mother    Depression Mother    Diabetes Mother    Heart disease Mother    Cancer Father        Prostate CA, Anal cancer    Dementia Father    Diabetes Brother    Healthy Brother    Healthy Brother    Social History   Socioeconomic History   Marital status: Widowed    Spouse name: Barnie   Number of children: 0   Years of education: Not on file   Highest education level: Doctorate  Occupational History   Occupation: disabled    Comment: multiple medical problems - first approved for uncontrolled DM, but now has heart disease and Myasthenia Gravis   Tobacco Use   Smoking status: Never   Smokeless tobacco: Never   Tobacco comments:    smoking cessation materials not required  Vaping Use   Vaping status: Never Used  Substance and Sexual Activity   Alcohol use: Not Currently    Alcohol/week: 0.0 standard drinks of alcohol   Drug use: No   Sexual activity: Not Currently    Partners: Female  Other Topics  Concern   Not on file  Social History Narrative   Not on file   Social Drivers of Health   Financial Resource Strain: Medium Risk (08/04/2024)   Overall Financial Resource Strain (CARDIA)    Difficulty of Paying Living Expenses: Somewhat hard  Food Insecurity: No Food Insecurity (07/24/2024)   Hunger Vital Sign    Worried About Running Out of Food in the Last Year: Never true    Ran Out of Food in the Last Year: Never true  Transportation Needs: No Transportation Needs (08/04/2024)   PRAPARE - Administrator, Civil Service (Medical): No    Lack of Transportation (Non-Medical): No  Physical Activity: Sufficiently Active (08/04/2024)   Exercise Vital Sign    Days of Exercise per Week: 5 days    Minutes of Exercise per Session: 40 min  Recent Concern: Physical Activity - Insufficiently Active (07/24/2024)   Exercise Vital Sign    Days of Exercise per Week: 3 days    Minutes of Exercise per Session: 20 min  Stress: Stress Concern Present (07/24/2024)   [employer] of Occupational Health - Occupational Stress Questionnaire    Feeling of Stress: Very much  Social Connections: Moderately Integrated (08/04/2024)   Social Connection and Isolation Panel    Frequency of Communication with Friends and Family: Three times a week    Frequency of Social Gatherings with Friends and Family: Never    Attends Religious Services: 1 to 4 times per year    Active Member of Clubs or Organizations: No  Attends Banker Meetings: 1 to 4 times per year    Marital Status: Widowed  Recent Concern: Social Connections - Moderately Isolated (07/24/2024)   Social Connection and Isolation Panel    Frequency of Communication with Friends and Family: Three times a week    Frequency of Social Gatherings with Friends and Family: Never    Attends Religious Services: 1 to 4 times per year    Active Member of [employer] or Organizations: No    Attends Banker Meetings: Not  on file    Marital Status: Widowed    Tobacco Counseling Counseling given: Not Answered Tobacco comments: smoking cessation materials not required    Clinical Intake:  Pre-visit preparation completed: Yes  Pain : 0-10 Pain Score: 3  Pain Type: Chronic pain Pain Location: Back Pain Orientation: Lower Pain Descriptors / Indicators: Aching Pain Onset: Today Pain Frequency: Several days a week     BMI - recorded: 32.89 Nutritional Status: BMI > 30  Obese Nutritional Risks: None Diabetes: Yes CBG done?: No Did pt. bring in CBG monitor from home?: No  Lab Results  Component Value Date   HGBA1C 7.0 03/06/2024   HGBA1C 7.5 10/21/2022   HGBA1C 7.7 06/24/2022     How often do you need to have someone help you when you read instructions, pamphlets, or other written materials from your doctor or pharmacy?: 1 - Never  Interpreter Needed?: No  Information entered by :: Jeaneen Cala ,CMA   Activities of Daily Living     08/04/2024   11:44 AM 09/14/2023   11:06 AM  In your present state of health, do you have any difficulty performing the following activities:  Hearing? 0 1  Vision? 0 0  Difficulty concentrating or making decisions? 0 0  Walking or climbing stairs? 0 0  Dressing or bathing? 0 0  Doing errands, shopping? 0 0  Preparing Food and eating ? N   Using the Toilet? N   In the past six months, have you accidently leaked urine? N   Do you have problems with loss of bowel control? N   Managing your Medications? N   Managing your Finances? N   Housekeeping or managing your Housekeeping? N     Patient Care Team: Sowles, Krichna, MD as PCP - General (Family Medicine) Darron Deatrice LABOR, MD as PCP - Cardiology (Cardiology) Theotis Lavelle BRAVO, MD as Consulting Physician (Pulmonary Disease) Cherilyn Debby CROME, MD as Consulting Physician (Endocrinology) Jinny Carmine, MD as Consulting Physician (Gastroenterology) Myra Lynwood MATSU, MD as Consulting Physician  (Pain Medicine) Ermalinda Lenn HERO, LCSW as Social Worker Darron Deatrice LABOR, MD as Consulting Physician (Cardiology) Pa, [facility] (Optometry)  I have updated your Care Teams any recent Medical Services you may have received from other providers in the past year.     Assessment:   This is a routine wellness examination for [name].  Hearing/Vision screen Hearing Screening - Comments:: Some difficulties Vision Screening - Comments:: Wears glasses   Goals Addressed             This Visit's Progress    DIET - INCREASE WATER  INTAKE   On track    Recommend to drink at least 6-8 8oz glasses of water  per day.       Depression Screen     08/04/2024   11:46 AM 07/24/2024   12:57 PM 05/31/2024    9:34 AM 05/16/2024   10:57 AM 03/17/2024    9:05 AM 01/11/2024  11:13 AM 12/22/2023    1:21 PM  PHQ 2/9 Scores  PHQ - 2 Score 2 4 2  0 2  1  PHQ- 9 Score 4 13 6  0 7       Information is confidential and restricted. Go to Review Flowsheets to unlock data.    Fall Risk     08/04/2024   11:45 AM 07/24/2024   12:48 PM 05/31/2024    9:34 AM 05/16/2024   10:57 AM 03/17/2024    9:04 AM  Fall Risk   Falls in the past year? 0 0 0 0 0  Number falls in past yr: 0 0 0 0 0  Injury with Fall? 0 0 0 0 0  Risk for fall due to : No Fall Risks No Fall Risks No Fall Risks No Fall Risks No Fall Risks  Follow up Falls evaluation completed Falls evaluation completed Falls evaluation completed Falls evaluation completed Education provided;Falls evaluation completed;Falls prevention discussed    MEDICARE RISK AT HOME:  Medicare Risk at Home Any stairs in or around the home?: Yes If so, are there any without handrails?: No Home free of loose throw rugs in walkways, pet beds, electrical cords, etc?: Yes Adequate lighting in your home to reduce risk of falls?: Yes Life alert?: No Use of a cane, walker or w/c?: No Grab bars in the bathroom?: Yes Shower chair or bench in shower?: Yes Elevated  toilet seat or a handicapped toilet?: Yes  TIMED UP AND GO:  Was the test performed?    Cognitive Function: 6CIT completed    03/22/2023    9:41 AM 01/12/2022    3:05 PM  MMSE - Mini Mental State Exam  Orientation to time 5 5  Orientation to Place 5 5  Registration 3 3  Attention/ Calculation 5 5  Recall 3 3  Language- name 2 objects 2 2  Language- repeat 1 1  Language- follow 3 step command 3 3  Language- read & follow direction 1 1  Write a sentence 1 1  Copy design 1 1  Total score 30 30        08/04/2024   11:42 AM 07/23/2023    1:53 PM 03/22/2023    9:46 AM 07/23/2020   11:32 AM 02/25/2018    3:22 PM  6CIT Screen  What Year? 0 points 0 points 0 points 0 points 0 points  What month? 0 points 0 points 0 points 0 points 0 points  What time? 0 points 0 points 0 points 0 points 0 points  Count back from 20 0 points 0 points 0 points 0 points 0 points  Months in reverse 0 points 0 points 0 points 0 points 0 points  Repeat phrase 0 points 0 points 0 points 0 points 0 points  Total Score 0 points 0 points 0 points 0 points 0 points    Immunizations Immunization History  Administered Date(s) Administered   INFLUENZA, HIGH DOSE SEASONAL PF 07/14/2024   Influenza Inj Mdck Quad Pf 06/14/2019   Influenza, Seasonal, Injecte, Preservative Fre 08/26/2023   Influenza,inj,Quad PF,6+ Mos 06/16/2017, 06/10/2018, 07/23/2020, 06/04/2022   Influenza-Unspecified 07/18/2021   Moderna Covid-19 Fall Seasonal Vaccine 2yrs & older 09/29/2022, 08/26/2023   PFIZER(Purple Top)SARS-COV-2 Vaccination 12/22/2019, 01/16/2020, 08/05/2020   PNEUMOCOCCAL CONJUGATE-20 05/31/2024   Pfizer Covid-19 Vaccine Bivalent Booster 29yrs & up 07/17/2021, 07/18/2021, 09/29/2022   Pneumococcal Conjugate-13 07/21/2019   Pneumococcal Polysaccharide-23 01/25/2018   RSV,unspecified 09/29/2022   Respiratory Syncytial Virus Vaccine,Recomb Aduvanted(Arexvy) 09/29/2022  Td 04/04/2013    Screening Tests Health  Maintenance  Topic Date Due   DTaP/Tdap/Td (2 - Tdap) 04/05/2023   COVID-19 Vaccine (7 - 2025-26 season) 06/05/2024   Zoster Vaccines- Shingrix  (1 of 2) 08/31/2024 (Originally 06/28/1980)   HEMOGLOBIN A1C  09/05/2024   OPHTHALMOLOGY EXAM  01/04/2025   FOOT EXAM  03/08/2025   Diabetic kidney evaluation - eGFR measurement  04/27/2025   Diabetic kidney evaluation - Urine ACR  05/31/2025   Medicare Annual Wellness (AWV)  08/04/2025   Colonoscopy  04/14/2026   Pneumococcal Vaccine: 50+ Years  Completed   Influenza Vaccine  Completed   Hepatitis C Screening  Completed   HIV Screening  Completed   Hepatitis B Vaccines 19-59 Average Risk  Aged Out   Meningococcal B Vaccine  Aged Out    Health Maintenance Items Addressed:patient declined  Additional Screening:  Vision Screening: Recommended annual ophthalmology exams for early detection of glaucoma and other disorders of the eye. Is the patient up to date with their annual eye exam?  No  Who is the provider or what is the name of the office in which the patient attends annual eye exams?   Dental Screening: Recommended annual dental exams for proper oral hygiene  Community Resource Referral / Chronic Care Management: CRR required this visit?  No   CCM required this visit?  No   Plan:    I have personally reviewed and noted the following in the patient's chart:   Medical and social history Use of alcohol, tobacco or illicit drugs  Current medications and supplements including opioid prescriptions. Patient is not currently taking opioid prescriptions. Functional ability and status Nutritional status Physical activity Advanced directives List of other physicians Hospitalizations, surgeries, and ER visits in previous 12 months Vitals Screenings to include cognitive, depression, and falls Referrals and appointments  In addition, I have reviewed and discussed with patient certain preventive protocols, quality metrics, and best  practice recommendations. A written personalized care plan for preventive services as well as general preventive health recommendations were provided to patient.   [name], [place]   08/04/2024   After Visit Summary: (MyChart) Due to this being a telephonic visit, the after visit summary with patients personalized plan was offered to patient via MyChart   Notes: Nothing significant to report at this time.

## 2024-08-07 ENCOUNTER — Encounter: Payer: Self-pay | Admitting: Radiology

## 2024-08-08 ENCOUNTER — Ambulatory Visit: Admitting: Licensed Clinical Social Worker

## 2024-08-08 DIAGNOSIS — Z634 Disappearance and death of family member: Secondary | ICD-10-CM

## 2024-08-08 DIAGNOSIS — F331 Major depressive disorder, recurrent, moderate: Secondary | ICD-10-CM | POA: Diagnosis not present

## 2024-08-08 NOTE — Progress Notes (Signed)
 THERAPIST PROGRESS NOTE  Session Time: 10:02-10:50am  Participation Level: Active  Behavioral Response: CasualAlertEuthymic  Type of Therapy: Individual Therapy  Treatment Goals addressed:  Active     OP Depression     LTG: Reduce frequency, intensity, and duration of depression symptoms so that daily functioning is improved     Start:  07/27/24    Expected End:  10/27/24         LTG: Increase coping skills to manage depression and improve ability to perform daily activities     Start:  07/27/24    Expected End:  10/27/24         STG: Jarmaine Ehrler will identify cognitive patterns and beliefs that support depression     Start:  07/27/24    Expected End:  10/27/24         Demonstrates progress in stages of grief at own pace     Start:  07/27/24    Expected End:  10/27/24            Work with [name] to identify the major components of a recent episode of depression: physical symptoms, major thoughts and images, and major behaviors they experienced     Start:  07/27/24         Lasalle Abee will identify 2 cognitive distortions they are currently using and write reframing statements to replace them     Start:  07/27/24         Coping Skills     Start:  07/27/24       Will work with the pt using CBT/DBT techniques to help the pt verbalize an understanding of the cognitive, physiological, and behavioral components of depression and its treatment. This will be done by using worksheets, interactive activities, CBT/ABC thought logs, modeling, homework, role playing and journaling. Will work with pt to learn and implement coping skills that result in a reduction of depression and improve daily functioning per pt self-report 3 out of 5 documented sessions.       Educate patient on: Stages of grief     Start:  07/27/24               ProgressTowards Goals: Progressing  Interventions: CBT, Supportive, Reframing, and Other: Grief work  Summary:  [name] is a 63 y.o. male who presents with symptoms of anxiety. Patient reports symptoms to include difficulty falling and staying asleep, avoidance, restlessness, tearfulness, low mood. Pt was oriented times 5. Pt was cooperative and engaged. Pt denies SI/HI/AVH.   Patient utilized therapeutic space to process improve mental health following changes in medication.  Patient reports taking sleep aids as well as anxiety medications for panic attacks.  Patient reports he has not experienced a panic attack in over a month since beginning his medications leading to him taking anxiety medications as needed.  Patient reflected on progression through stages of grief identifying steps to donating his wife close which led to an influx of memories.  Patient identified he coped by letting himself grieve and cry stating I am not a crier.  Patient also identified he is copes by spending time outside.  Patient expressed worry about the holidays and conversations he is already having with family related to time spent during the holidays.  Patient reflected on how he witnessed his father grieve and fears he will face similar judgment from other people.  Clinician worked with patient to challenge this belief and prioritize focusing his energy on himself.  Patient also  expressed future intentions to cope by writing and pouring into his meditation practice.    Suicidal/Homicidal: Nowithout intent/plan  Therapist Response: Cln utilized active and supportive reflection to create safe environment for patient to process recent life stressors. Clinician assessed for current symptoms, stressors, safety since last session.  Continue to reflect on ways in which patient is grieving his wife and coping with anxiety and depression during this time.  Plan: Return again in 2 weeks.  Diagnosis:MDD (major depressive disorder), recurrent episode, moderate (HCC)  Recent bereavement   Collaboration of Care: AEB psychiatrist  can access notes and cln. Will review psychiatrists' notes. Check in with the patient and will see LCSW per availability. Patient agreed with treatment recommendations.   Patient/Guardian was advised Release of Information must be obtained prior to any record release in order to collaborate their care with an outside provider. Patient/Guardian was advised if they have not already done so to contact the registration department to sign all necessary forms in order for us  to release information regarding their care.   Consent: Patient/Guardian gives verbal consent for treatment and assignment of benefits for services provided during this visit. Patient/Guardian expressed understanding and agreed to proceed.   Evalene KATHEE Husband, LCSW 08/08/2024

## 2024-08-14 ENCOUNTER — Ambulatory Visit: Admitting: Podiatry

## 2024-08-14 DIAGNOSIS — M79674 Pain in right toe(s): Secondary | ICD-10-CM

## 2024-08-14 DIAGNOSIS — E1142 Type 2 diabetes mellitus with diabetic polyneuropathy: Secondary | ICD-10-CM

## 2024-08-14 DIAGNOSIS — M79675 Pain in left toe(s): Secondary | ICD-10-CM | POA: Diagnosis not present

## 2024-08-14 DIAGNOSIS — B351 Tinea unguium: Secondary | ICD-10-CM

## 2024-08-15 ENCOUNTER — Telehealth: Payer: Self-pay

## 2024-08-15 ENCOUNTER — Ambulatory Visit: Attending: Anesthesiology | Admitting: Anesthesiology

## 2024-08-15 ENCOUNTER — Encounter: Payer: Self-pay | Admitting: Anesthesiology

## 2024-08-15 VITALS — BP 137/66 | HR 94 | Temp 97.0°F | Resp 16 | Ht 67.0 in | Wt 204.0 lb

## 2024-08-15 DIAGNOSIS — Z79891 Long term (current) use of opiate analgesic: Secondary | ICD-10-CM

## 2024-08-15 DIAGNOSIS — G894 Chronic pain syndrome: Secondary | ICD-10-CM

## 2024-08-15 DIAGNOSIS — M5136 Other intervertebral disc degeneration, lumbar region with discogenic back pain only: Secondary | ICD-10-CM | POA: Diagnosis present

## 2024-08-15 DIAGNOSIS — M4716 Other spondylosis with myelopathy, lumbar region: Secondary | ICD-10-CM | POA: Diagnosis present

## 2024-08-15 DIAGNOSIS — G7000 Myasthenia gravis without (acute) exacerbation: Secondary | ICD-10-CM | POA: Diagnosis present

## 2024-08-15 DIAGNOSIS — M545 Low back pain, unspecified: Secondary | ICD-10-CM | POA: Diagnosis present

## 2024-08-15 DIAGNOSIS — M5432 Sciatica, left side: Secondary | ICD-10-CM | POA: Diagnosis present

## 2024-08-15 DIAGNOSIS — M47817 Spondylosis without myelopathy or radiculopathy, lumbosacral region: Secondary | ICD-10-CM | POA: Diagnosis not present

## 2024-08-15 DIAGNOSIS — F1190 Opioid use, unspecified, uncomplicated: Secondary | ICD-10-CM | POA: Insufficient documentation

## 2024-08-15 DIAGNOSIS — A6923 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: Secondary | ICD-10-CM

## 2024-08-15 DIAGNOSIS — R29898 Other symptoms and signs involving the musculoskeletal system: Secondary | ICD-10-CM | POA: Diagnosis present

## 2024-08-15 MED ORDER — OXYCODONE HCL 10 MG PO TABS: 10.0000 mg | ORAL_TABLET | Freq: Four times a day (QID) | ORAL | 0 refills | Status: DC

## 2024-08-15 MED ORDER — OXYCODONE HCL 10 MG PO TABS: 10.0000 mg | ORAL_TABLET | Freq: Four times a day (QID) | ORAL | 0 refills | Status: AC
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
## 2024-08-17 LAB — TOXASSURE SELECT 13 (MW), URINE

## 2024-08-21 ENCOUNTER — Other Ambulatory Visit: Payer: Self-pay | Admitting: Family Medicine

## 2024-08-21 DIAGNOSIS — F5102 Adjustment insomnia: Secondary | ICD-10-CM

## 2024-08-21 DIAGNOSIS — F4321 Adjustment disorder with depressed mood: Secondary | ICD-10-CM

## 2024-08-21 DIAGNOSIS — F339 Major depressive disorder, recurrent, unspecified: Secondary | ICD-10-CM

## 2024-08-21 NOTE — Progress Notes (Signed)
  Subjective:  Patient ID: [name], male    DOB: [birth year],  MRN: [id number]  [name] presents to clinic today for at risk foot care with history of diabetic neuropathy and painful mycotic toenails x 10 which interfere with daily activities. Pain is relieved with periodic professional debridement.  Chief Complaint  Patient presents with   Toe Pain    Dr. Glenard is his PCP. Last visit was in Oct. His A1c is 7   New problem(s): None.   PCP is Sowles, Krichna, MD.  Allergies  Allergen Reactions   Azathioprine Other (See Comments)    Azathioprine hypersensitivity reaction - Symptoms mimicking sepsis - was hospitalized   Novolog  [Insulin  Aspart (Human Analog) (Yeast)] Hives    Review of Systems: Negative except as noted in the HPI.  Objective: No changes noted in today's physical examination. There were no vitals filed for this visit. [name] is a pleasant 63 y.o. male in NAD. AAO x 3.  Vascular Examination: Capillary refill time immediate b/l. Vascular status intact b/l with palpable pedal pulses. Pedal hair present b/l. No pain with calf compression b/l. Skin temperature gradient WNL b/l. No cyanosis or clubbing b/l. No ischemia or gangrene noted b/l. No edema noted b/l LE.  Neurological Examination: Pt has subjective symptoms of neuropathy. Protective sensation decreased with 10 gram monofilament b/l.  Dermatological Examination: Pedal skin with normal turgor, texture and tone b/l.  No open wounds. No interdigital macerations.   Toenails 1-5 b/l thick, discolored, elongated with subungual debris and pain on dorsal palpation.   Pedal integument with normal turgor, texture and tone BLE. No open wounds b/l LE. No hyperkeratotic nor porokeratotic lesions present on today's visit.  Musculoskeletal Examination: Muscle strength 5/5 to all lower extremity muscle groups bilaterally. C-shaped foot RLE..  Radiographs: None  Assessment/Plan: 1. Pain due to  onychomycosis of toenails of both feet   2. Diabetic peripheral neuropathy [facility])   Patient was evaluated and treated. All patient's and/or POA's questions/concerns addressed on today's visit. Toenails 1-5 b/l debrided in length and girth without incident. Continue foot and shoe inspections daily. Monitor blood glucose per PCP/Endocrinologist's recommendations. Continue soft, supportive shoe gear daily. Report any pedal injuries to medical professional. Call office if there are any questions/concerns. -Patient/POA to call should there be question/concern in the interim.   Return in about 3 months (around 11/14/2024).  [name] Merlin, DPM      [facility] LOCATION: [address], [place] [address]                   Office [phone]   [facility] LOCATION: [address] [place], [place] [address] Office [phone]

## 2024-08-22 NOTE — Telephone Encounter (Signed)
 Requested medication (s) are due for refill today: yes  Requested medication (s) are on the active medication list: yes  Last refill:  07/24/24  Future visit scheduled: no  Notes to clinic:  Unable to refill per protocol, cannot delegate.      Requested Prescriptions  Pending Prescriptions Disp Refills   QUEtiapine (SEROQUEL) 25 MG tablet [Pharmacy Med Name: QUETIAPINE 25MG  TABLETS] 30 tablet 0    Sig: TAKE 1 TABLET(25 MG) BY MOUTH AT BEDTIME     Not Delegated - Psychiatry:  Antipsychotics - Second Generation (Atypical) - quetiapine Failed - 08/22/2024  4:27 PM      Failed - This refill cannot be delegated      Failed - Lipid Panel in normal range within the last 12 months    Cholesterol, Total  Date Value Ref Range Status  07/17/2021 92 (L) 100 - 199 mg/dL Final   Cholesterol  Date Value Ref Range Status  12/02/2023 113 <200 mg/dL Final   LDL Cholesterol (Calc)  Date Value Ref Range Status  12/02/2023 46 mg/dL (calc) Final    Comment:    Reference range: <100 . Desirable range <100 mg/dL for primary prevention;   <70 mg/dL for patients with CHD or diabetic patients  with > or = 2 CHD risk factors. SABRA LDL-C is now calculated using the Martin-Hopkins  calculation, which is a validated novel method providing  better accuracy than the Friedewald equation in the  estimation of LDL-C.  [name] et al. SANDREA. 7986;689(80): 2061-2068  (http://education.[employer].com/faq/FAQ164)    LDL Direct  Date Value Ref Range Status  07/17/2021 36 0 - 99 mg/dL Final   HDL  Date Value Ref Range Status  12/02/2023 45 > OR = 40 mg/dL Final  89/86/7977 28 (L) >39 mg/dL Final   Triglycerides  Date Value Ref Range Status  12/02/2023 140 <150 mg/dL Final         Passed - TSH in normal range and within 360 days    TSH  Date Value Ref Range Status  12/02/2023 3.40 0.40 - 4.50 mIU/L Final         Passed - Completed PHQ-2 or PHQ-9 in the last 360 days      Passed - Last  BP in normal range    BP Readings from Last 1 Encounters:  08/15/24 137/66         Passed - Last Heart Rate in normal range    Pulse Readings from Last 1 Encounters:  08/15/24 94         Passed - Valid encounter within last 6 months    Recent Outpatient Visits           4 weeks ago Major depression, recurrent, chronic   [place] [facility], Dorette, MD   2 months ago Type 2 diabetes mellitus with diabetic polyneuropathy, with long-term current use of insulin  [facility])   [place] [facility] Glenard Dorette, MD   3 months ago Other eczema   [place] [facility] [facility] Glenard Dorette, MD   3 months ago Shortness of breath   [facility] Gareth Clarity F, FNP   5 months ago Severe persistent asthma with (acute) exacerbation   [facility] Sowles, Krichna, MD       Future Appointments             In 2 weeks Gust Molly, DO [facility]   In 1 month  Vivienne Lonni Ingle, NP [facility] [place]   In 3 months Glenard Mire, MD [facility] [place]            Passed - CBC within normal limits and completed in the last 12 months    WBC  Date Value Ref Range Status  04/27/2024 7.2 3.8 - 10.8 Thousand/uL Final   RBC  Date Value Ref Range Status  04/27/2024 4.31 4.20 - 5.80 Million/uL Final   Hemoglobin  Date Value Ref Range Status  04/27/2024 12.7 (L) 13.2 - 17.1 g/dL Final  96/91/7976 87.1 (L) 13.0 - 17.7 g/dL Final   HCT  Date Value Ref Range Status  04/27/2024 39.5 38.5 - 50.0 % Final   Hematocrit  Date Value Ref Range Status  12/10/2021 40.4 37.5 - 51.0 % Final   MCHC  Date Value Ref Range Status  04/27/2024 32.2 32.0 - 36.0 g/dL Final    Comment:    For adults, a slight decrease in the calculated MCHC value (in the range of 30 to 32 g/dL) is most likely not clinically significant;  however, it should be interpreted with caution in correlation with other red cell parameters and the patient's clinical condition.    [facility]  Date Value Ref Range Status  04/27/2024 29.5 27.0 - 33.0 pg Final   MCV  Date Value Ref Range Status  04/27/2024 91.6 80.0 - 100.0 fL Final  12/10/2021 91 79 - 97 fL Final   No results found for: PLTCOUNTKUC, LABPLAT, POCPLA RDW  Date Value Ref Range Status  04/27/2024 12.9 11.0 - 15.0 % Final  12/10/2021 15.3 11.6 - 15.4 % Final         Passed - CMP within normal limits and completed in the last 12 months    Albumin  Date Value Ref Range Status  06/24/2022 3.9 3.5 - 5.0 Final  07/17/2021 4.2 3.8 - 4.8 g/dL Final   Albumin ELP  Date Value Ref Range Status  08/03/2018 2.8 (L) 3.8 - 4.8 g/dL Final   Alkaline Phosphatase  Date Value Ref Range Status  06/24/2022 66 25 - 125 Final   Alkaline phosphatase (APISO)  Date Value Ref Range Status  04/27/2024 73 35 - 144 U/L Final   ALT  Date Value Ref Range Status  04/27/2024 15 9 - 46 U/L Final   AST  Date Value Ref Range Status  04/27/2024 18 10 - 35 U/L Final   BUN  Date Value Ref Range Status  04/27/2024 21 7 - 25 mg/dL Final  96/73/7974 32 (H) 8 - 27 mg/dL Final   Calcium   Date Value Ref Range Status  04/27/2024 9.2 8.6 - 10.3 mg/dL Final   CO2  Date Value Ref Range Status  04/27/2024 32 20 - 32 mmol/L Final   Creat  Date Value Ref Range Status  04/27/2024 1.41 (H) 0.70 - 1.35 mg/dL Final   Creatinine, Urine  Date Value Ref Range Status  05/31/2024 60 20 - 320 mg/dL Final   Glucose  Date Value Ref Range Status  10/21/2022 89  Final   Glucose, Bld  Date Value Ref Range Status  04/27/2024 106 (H) 65 - 99 mg/dL Final    Comment:    .            Fasting reference interval . For someone without known diabetes, a glucose value between 100 and 125 mg/dL is consistent with prediabetes and should be confirmed with a follow-up test. .  POC Glucose   Date Value Ref Range Status  08/22/2015 99 70 - 99 mg/dl Final   Glucose-Capillary  Date Value Ref Range Status  02/02/2022 149 (H) 70 - 99 mg/dL Final    Comment:    Glucose reference range applies only to samples taken after fasting for at least 8 hours.   Potassium  Date Value Ref Range Status  04/27/2024 4.6 3.5 - 5.3 mmol/L Final   Sodium  Date Value Ref Range Status  04/27/2024 140 135 - 146 mmol/L Final  12/29/2023 140 134 - 144 mmol/L Final   Total Bilirubin  Date Value Ref Range Status  04/27/2024 0.4 0.2 - 1.2 mg/dL Final   Bilirubin Total  Date Value Ref Range Status  07/17/2021 0.3 0.0 - 1.2 mg/dL Final   Bilirubin, Total  Date Value Ref Range Status  06/24/2022 0.6  Final   Bilirubin, Direct  Date Value Ref Range Status  01/17/2018 0.12 0.00 - 0.40 mg/dL Final   Indirect Bilirubin  Date Value Ref Range Status  11/29/2017 NOT CALCULATED 0.3 - 0.9 mg/dL Final    Comment:    Performed at [facility], [address] [place], [place] [address]   Protein, ur  Date Value Ref Range Status  07/19/2020 NEGATIVE NEGATIVE mg/dL Final   Total Protein  Date Value Ref Range Status  04/27/2024 6.2 6.1 - 8.1 g/dL Final  89/86/7977 6.2 6.0 - 8.5 g/dL Final   GFR, Est African American  Date Value Ref Range Status  10/16/2019 52 (L) > OR = 60 mL/min/1.57m2 Final   GFR calc Af Amer  Date Value Ref Range Status  09/17/2020 61 >59 mL/min/1.73 Final    Comment:    **In accordance with recommendations from the NKF-ASN Task force,**   [facility] is in the process of updating its eGFR calculation to the   2021 CKD-EPI creatinine equation that estimates kidney function   without a race variable.    eGFR  Date Value Ref Range Status  04/27/2024 56 (L) > OR = 60 mL/min/1.13m2 Final  12/29/2023 57 (L) >59 mL/min/1.73 Final   GFR, Est Non African American  Date Value Ref Range Status  10/16/2019 45 (L) > OR = 60 mL/min/1.2m2 Final   GFR, Estimated   Date Value Ref Range Status  11/25/2022 >60 >60 mL/min Final    Comment:    (NOTE) Calculated using the CKD-EPI Creatinine Equation (2021)

## 2024-08-23 ENCOUNTER — Ambulatory Visit: Admitting: Licensed Clinical Social Worker

## 2024-08-23 DIAGNOSIS — Z634 Disappearance and death of family member: Secondary | ICD-10-CM | POA: Diagnosis not present

## 2024-08-23 DIAGNOSIS — F331 Major depressive disorder, recurrent, moderate: Secondary | ICD-10-CM

## 2024-08-23 NOTE — Progress Notes (Signed)
 Comprehensive Clinical Assessment (CCA) Note  08/23/2024 [name] [id number]  Chief Complaint:  Chief Complaint  Patient presents with   Grief   Depression   Visit Diagnosis: MDD (major depressive disorder), recurrent episode, moderate (HCC)  Recent bereavement     CCA Biopsychosocial Intake/Chief Complaint:  Patient is a 63 year old male who presents for his yearly assessment reveal to continue individual outpatient therapeutic services for grief and depression.  Current Symptoms/Problems: Patient identifies his wife passed a few months ago and as a result, he has noticed symptoms to include but not limited to anhedonia, low mood, feelings of loneliness, patterns of isolation, difficulty with sleep.   Patient Reported Schizophrenia/Schizoaffective Diagnosis in Past: No data recorded  Strengths: creative, self aware  Preferences: No data recorded Abilities: Self expression   Type of Services Patient Feels are Needed: Individual outpatient therapeutic services   Initial Clinical Notes/Concerns: No data recorded  Mental Health Symptoms Depression:  Change in energy/activity; Sleep (too much or little); Increase/decrease in appetite; Difficulty Concentrating; Fatigue; Hopelessness; Irritability; Tearfulness; Worthlessness   Duration of Depressive symptoms: Greater than two weeks   Mania:  None   Anxiety:   Worrying; Tension; Irritability; Difficulty concentrating; Fatigue; Sleep   Psychosis:  None   Duration of Psychotic symptoms: No data recorded  Trauma:  Avoids reminders of event; Detachment from others (If I'm in a restaurant I always put my back to the wall)   Obsessions:  None   Compulsions:  Good insight   Inattention:  Disorganized; Forgetful; Fails to pay attention/makes careless mistakes   Hyperactivity/Impulsivity:  None   Oppositional/Defiant Behaviors:  Easily annoyed; Temper; None   Emotional Irregularity:  Mood lability   Other  Mood/Personality Symptoms:  No data recorded   Mental Status Exam Appearance and self-care  Stature:  Average   Weight:  Average weight   Clothing:  Neat/clean   Grooming:  Normal   Cosmetic use:  None   Posture/gait:  Normal   Motor activity:  Not Remarkable   Sensorium  Attention:  Normal   Concentration:  Normal   Orientation:  X5   Recall/memory:  Normal   Affect and Mood  Affect:  Anxious   Mood:  Euthymic   Relating  Eye contact:  Normal   Facial expression:  Responsive   Attitude toward examiner:  Cooperative   Thought and Language  Speech flow: Clear and Coherent   Thought content:  Appropriate to Mood and Circumstances   Preoccupation:  Guilt; None   Hallucinations:  None   Organization:  No data recorded  [employer] of Knowledge:  Average   Intelligence:  Average   Abstraction:  Normal   Judgement:  Good   Reality Testing:  Adequate   Insight:  Fair   Decision Making:  Normal   Social Functioning  Social Maturity:  Responsible   Social Judgement:  Normal   Stress  Stressors:  Relationship; Illness; Grief/losses   Coping Ability:  Normal   Skill Deficits:  Activities of daily living   Supports:  Friends/Service system; Family     Religion: Religion/Spirituality Are You A Religious Person?: Yes  Leisure/Recreation: Leisure / Recreation Do You Have Hobbies?: Yes Leisure and Hobbies: Write Curator, Gardening  Exercise/Diet: Exercise/Diet Do You Exercise?: No Have You Gained or Lost A Significant Amount of Weight in the Past Six Months?: No Do You Follow a Special Diet?: No Do You Have Any Trouble Sleeping?: Yes Explanation of Sleeping Difficulties: Difficulty falling and staying  asleep-getting six hours of sleep a night   CCA Employment/Education Employment/Work Situation:    Education:     CCA Family/Childhood History Family and Relationship History:    Childhood History:      Child/Adolescent Assessment:     CCA Substance Use Alcohol/Drug Use:                           ASAM's:  Six Dimensions of Multidimensional Assessment  Dimension 1:  Acute Intoxication and/or Withdrawal Potential:      Dimension 2:  Biomedical Conditions and Complications:      Dimension 3:  Emotional, Behavioral, or Cognitive Conditions and Complications:     Dimension 4:  Readiness to Change:     Dimension 5:  Relapse, Continued use, or Continued Problem Potential:     Dimension 6:  Recovery/Living Environment:     ASAM Severity Score:    ASAM Recommended Level of Treatment:     Substance use Disorder (SUD)    Recommendations for Services/Supports/Treatments:    DSM5 Diagnoses: 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/02/2021   Gastropathy 08/13/2021   Iron  deficiency anemia due to chronic blood loss    Pain due to onychomycosis of toenails of both feet 11/11/2020   Lung involvement associated with another disorder (HCC) 05/17/2020   Lower urinary tract symptoms 09/15/2019   Incomplete bladder emptying 09/15/2019   Carpal tunnel syndrome on both sides 07/12/2019   Idiopathic peripheral neuropathy 04/21/2019   Sensory ataxia 03/09/2019   OSA on CPAP 07/08/2018   Congestive heart failure (CHF) (HCC) 05/05/2018   Unstable angina (HCC) 04/26/2018   Ischemic cardiomyopathy 04/26/2018   Chronic combined systolic and diastolic CHF (congestive heart failure) (HCC) 04/26/2018   Anginal equivalent  04/25/2018   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/25/2018   Vitamin D  deficiency, unspecified 08/12/2017   Ptosis of left eyelid 01/08/2017   Dermatitis 12/24/2016   Benign neoplasm of sigmoid colon    Benign neoplasm of descending colon    Benign neoplasm of transverse colon    Coronary artery disease involving native coronary artery with angina pectoris 10/22/2015   Coronary artery disease 08/22/2015   Myasthenia gravis (HCC) 08/22/2015   Chronic left-sided low back pain 08/22/2015   Hypertension 08/22/2015   GERD (gastroesophageal reflux disease) 08/22/2015   Hyperlipidemia 08/22/2015   Insomnia 08/22/2015   Type 2 diabetes mellitus with diabetic polyneuropathy, with long-term current use of insulin  (HCC) 08/22/2015   Lyme disease 06/05/1992    Assessment: Client continues to meet the diagnostic criteria for MDD and bereavement, as evidenced by persistent symptoms including low mood, irritability, fatigue, negative self affect, difficulty concentrating, uncontrollable worry, restlessness, anxious feelings. Reports "rolling waves" of anxiety that come out of nowhere. Patient cannot identify a trigger for these symptoms. Despite significant progress made in treatment over the past year, client continues to experience functional impairment in self-care and relationships and has not yet achieved a stable level of functioning. Without continued therapeutic support, there is a substantial risk of symptom relapse and a decline in functioning.  Intervention and Rationale: Continued therapeutic intervention is medically necessary to: Achieve treatment goals: Help the client progress toward their established long-term goals of managing compulsions and coping with emotional liability associated with recent death of his spouse.  Stabilize chronic conditions: Maintain the client's current level of stability and prevent deterioration of their chronic mental health condition. Strengthen  coping skills: Reinforce skills learned to date and apply them to new or evolving challenges, thereby preventing relapse and sustaining progress. Process deeper issues: Address underlying, more deeply ingrained issues of grief that require long-term therapeutic engagement to resolve.  Plan: Continue individual therapy at a frequency of bi-weekly to compulsions and managing episodes of anhedonia and depression and work toward resolving long-term treatment goals. The treatment plan will be reviewed every 90 days to assess progress and modify goals as appropriate.  Patient Centered Plan: Patient is on the following Treatment Plan(s):  Depression   Referrals to Alternative Service(s): Referred to Alternative Service(s):   Place:   Date:   Time:    Referred to Alternative Service(s):   Place:   Date:   Time:    Referred to Alternative Service(s):   Place:   Date:   Time:    Referred to Alternative Service(s):   Place:   Date:   Time:      Collaboration of Care: Other meet with LCSW per availability  Patient/Guardian was advised Release of Information must be obtained prior to any record release in order to collaborate their care with an outside provider. Patient/Guardian was advised if they have not already done so to contact the registration department to sign all necessary forms in order for us  to release information regarding their care.   Consent: Patient/Guardian gives verbal consent for treatment and assignment of benefits for services provided during this visit. Patient/Guardian expressed understanding and agreed to proceed.   Evalene KATHEE Husband, LCSW

## 2024-08-24 ENCOUNTER — Encounter: Payer: Self-pay | Admitting: Family Medicine

## 2024-08-24 ENCOUNTER — Ambulatory Visit (INDEPENDENT_AMBULATORY_CARE_PROVIDER_SITE_OTHER): Admitting: Family Medicine

## 2024-08-24 VITALS — BP 106/68 | HR 95 | Resp 16 | Ht 67.0 in | Wt 212.0 lb

## 2024-08-24 DIAGNOSIS — E1142 Type 2 diabetes mellitus with diabetic polyneuropathy: Secondary | ICD-10-CM

## 2024-08-24 DIAGNOSIS — F410 Panic disorder [episodic paroxysmal anxiety] without agoraphobia: Secondary | ICD-10-CM

## 2024-08-24 DIAGNOSIS — F4321 Adjustment disorder with depressed mood: Secondary | ICD-10-CM

## 2024-08-24 DIAGNOSIS — M25511 Pain in right shoulder: Secondary | ICD-10-CM

## 2024-08-24 DIAGNOSIS — D500 Iron deficiency anemia secondary to blood loss (chronic): Secondary | ICD-10-CM

## 2024-08-24 DIAGNOSIS — Z794 Long term (current) use of insulin: Secondary | ICD-10-CM

## 2024-08-24 DIAGNOSIS — F331 Major depressive disorder, recurrent, moderate: Secondary | ICD-10-CM

## 2024-08-24 DIAGNOSIS — F5102 Adjustment insomnia: Secondary | ICD-10-CM

## 2024-08-24 DIAGNOSIS — E063 Autoimmune thyroiditis: Secondary | ICD-10-CM

## 2024-08-24 DIAGNOSIS — G8929 Other chronic pain: Secondary | ICD-10-CM

## 2024-08-24 MED ORDER — QUETIAPINE FUMARATE 25 MG PO TABS: 25.0000 mg | ORAL_TABLET | Freq: Every day | ORAL | 0 refills | Status: AC

## 2024-08-24 MED ORDER — HYDROXYZINE HCL 10 MG PO TABS: 10.0000 mg | ORAL_TABLET | Freq: Every day | ORAL | 0 refills | Status: AC | PRN

## 2024-08-24 MED ORDER — HYDROXYZINE HCL 10 MG PO TABS: 10.0000 mg | ORAL_TABLET | Freq: Every day | ORAL | 0 refills | Status: DC | PRN

## 2024-08-24 NOTE — Progress Notes (Signed)
 Name: [name]   MRN: [id number]    DOB: 18-Jun-1961   Date:08/24/2024       Progress Note  Subjective  Chief Complaint  Chief Complaint  Patient presents with   Medical Management of Chronic Issues    Buspar - not helping  Pt states sleeping much better    Discussed the use of AI scribe software for clinical note transcription with the patient, who gave verbal consent to proceed.  History of Present Illness [name] is a 63 year old male with chronic depression who presents for a follow-up visit.  He has chronic depression, which began after the death of his wife. He is currently taking duloxetine  90 mg for depression, quetiapine  for sleep, and buspirone , which is not helping with his anxiety. He has been seeing a therapist and has previously seen a psychiatrist once.  He experiences daily episodes of anxiety characterized by 'cold sweat' and shakiness, occurring once or twice a day, lasting 10-15 minutes. He manages these episodes by sitting down and taking deep breaths. He does not drive. He denies episodes happening secondary to hypoglycemic episodes, he has a CGM and insulin  pump  He has a history of iron  deficiency anemia, first noted three years ago during a hospitalization. He is currently taking iron  tablets. A comprehensive evaluation in 2022, including a colonoscopy and capsule endoscopy, did not reveal any source of bleeding. His hemoglobin was recently noted to be low at 12.7, and his ferritin level was 12.  He has type 2 diabetes and is using an insulin  pump, specifically the Omnipod, which he fills every three days. He estimates using 30 to 50 units of insulin  per day. The pump helps control his blood sugar levels better than previous methods and makes him more aware of his dietary intake.  He has asthma and occasionally takes Singulair , especially when the temperature fluctuates. His thyroid  function has not been recently checked.  He reports severe  arthritis in his right shoulder, for which he received an injection that provided relief. He plans to attend physical therapy for this condition.  Socially, he lives with his son and his son's girlfriend, which he describes as a positive and calming environment. He recently experienced the death of his mother-in-law, who had stage four ovarian cancer. He is considering arrangements for his father-in-law, who may move closer to him.    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/02/2021   Gastropathy 08/13/2021   Iron  deficiency anemia due to chronic blood loss    Pain due to onychomycosis of toenails of both feet 11/11/2020   Lung involvement associated with another disorder (HCC) 05/17/2020   Lower urinary tract symptoms 09/15/2019   Incomplete bladder emptying 09/15/2019   Carpal tunnel syndrome on both sides 07/12/2019   Idiopathic peripheral neuropathy 04/21/2019   Sensory ataxia 03/09/2019   OSA on CPAP 07/08/2018   Congestive heart failure (CHF) (HCC) 05/05/2018   Unstable angina (HCC) 04/26/2018   Ischemic cardiomyopathy 04/26/2018   Chronic combined systolic and diastolic CHF (congestive heart failure) (HCC) 04/26/2018   Anginal equivalent 04/25/2018  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/25/2018   Vitamin D  deficiency, unspecified 08/12/2017   Ptosis of left eyelid 01/08/2017   Dermatitis 12/24/2016   Benign neoplasm of sigmoid colon    Benign neoplasm of descending colon    Benign neoplasm of transverse  colon    Coronary artery disease involving native coronary artery with angina pectoris 10/22/2015   Coronary artery disease 08/22/2015   Myasthenia gravis (HCC) 08/22/2015   Chronic left-sided low back pain 08/22/2015   Hypertension 08/22/2015   GERD (gastroesophageal reflux disease) 08/22/2015   Hyperlipidemia 08/22/2015   Insomnia 08/22/2015   Type 2 diabetes mellitus with diabetic polyneuropathy, with long-term current use of insulin  (HCC) 08/22/2015   Lyme disease 06/05/1992    Social History   Tobacco Use   Smoking status: Never   Smokeless tobacco: Never   Tobacco comments:    smoking cessation materials not required  Substance Use Topics   Alcohol use: Not Currently    Alcohol/week: 0.0 standard drinks of alcohol     Current Outpatient Medications:    albuterol  (PROVENTIL ) (2.5 MG/3ML) 0.083% nebulizer solution, Take 3 mLs (2.5 mg total) by nebulization every 6 (six) hours as needed for wheezing or shortness of breath., Disp: 75 mL, Rfl: 2   albuterol  (VENTOLIN  HFA) 108 (90 Base) MCG/ACT inhaler, Inhale 2 puffs into the lungs daily., Disp: , Rfl:    ammonium lactate  (AMLACTIN) 12 % lotion, Apply 1 Application topically as needed for dry skin., Disp: 400 g, Rfl: 3   aspirin  EC 81 MG tablet, Take 1 tablet (81 mg total) by mouth daily., Disp: 90 tablet, Rfl: 3   busPIRone (BUSPAR) 5 MG tablet, Take 1 tablet (5 mg total) by mouth 3 (three) times daily with meals as needed., Disp: 60 tablet, Rfl: 0   clopidogrel  (PLAVIX ) 75 MG tablet, TAKE 1 TABLET BY MOUTH DAILY WITH BREAKFAST, Disp: 90 tablet, Rfl: 3   cyclobenzaprine  (FLEXERIL ) 10 MG tablet, Take 1 tablet (10 mg total) by mouth 2 (two) times daily., Disp: 60 tablet, Rfl: 3   DULoxetine  (CYMBALTA ) 30 MG capsule, Take 1 capsule (30 mg total) by mouth daily., Disp: 90 capsule, Rfl: 1   DULoxetine  (CYMBALTA ) 60 MG capsule, Take 1 capsule (60 mg total) by mouth daily., Disp: 90 capsule, Rfl: 1   ezetimibe  (ZETIA ) 10 MG tablet,  TAKE 1 TABLET(10 MG) BY MOUTH DAILY, Disp: 90 tablet, Rfl: 2   furosemide  (LASIX ) 40 MG tablet, Take 1 tablet (40 mg total) by mouth daily., Disp: 90 tablet, Rfl: 3   gabapentin  (NEURONTIN ) 300 MG capsule, Take 1 capsule (300 mg total) by mouth 3 (three) times daily., Disp: 90 capsule, Rfl: 1   gabapentin  (NEURONTIN ) 800 MG tablet, Take 800 mg by mouth at bedtime., Disp: , Rfl:    insulin  lispro (HUMALOG ) 100 UNIT/ML KiwkPen, Inject 55 Units into the skin daily. On insulin  pump, Disp: , Rfl:    JARDIANCE  10 MG TABS tablet, TAKE 1 TABLET(10 MG) BY MOUTH DAILY BEFORE BREAKFAST, Disp: 90 tablet, Rfl: 3   ketoconazole  (NIZORAL ) 2 % cream, Apply 1 Application topically daily. For rash on face, Disp: 60 g, Rfl: 0   levocetirizine (XYZAL ) 5 MG tablet, Take 1 tablet (5 mg total) by mouth daily at 12 noon., Disp: 90 tablet, Rfl: 1   levothyroxine  (SYNTHROID ) 50 MCG tablet, TAKE 1 TABLET BY MOUTH EVERY DAY ON MONDAY TO SATURDAY AND 2 TABLETS ON SUNDAY, Disp: 102 tablet, Rfl: 0   lubiprostone  (AMITIZA ) 8 MCG capsule, TAKE  1 CAPSULE(8 MCG) BY MOUTH TWICE DAILY WITH A MEAL, Disp: 180 capsule, Rfl: 1   metFORMIN  (GLUCOPHAGE ) 1000 MG tablet, Take 1,000 mg by mouth 2 (two) times daily with a meal., Disp: , Rfl:    montelukast  (SINGULAIR ) 10 MG tablet, Take 10 mg by mouth at bedtime., Disp: , Rfl:    Multiple Vitamins-Minerals (MENS MULTI VITAMIN & MINERAL PO), Take 1 tablet by mouth in the morning and at bedtime., Disp: , Rfl:    naloxone  (NARCAN ) nasal spray 4 mg/0.1 mL, For excess sedation from opioids, Disp: 1 kit, Rfl: 2   nitroGLYCERIN  (NITROSTAT ) 0.3 MG SL tablet, DISSOLVE 1 TABLET UNDER THE TONGUE EVERY 5 MINUTES AS NEEDED FOR CHEST PAIN, MAX 3 DOSES, Disp: 25 tablet, Rfl: 0   Oxycodone  HCl 10 MG TABS, Take 1 tablet (10 mg total) by mouth in the morning, at noon, in the evening, and at bedtime., Disp: 120 tablet, Rfl: 0   [START ON 09/20/2024] Oxycodone  HCl 10 MG TABS, Take 1 tablet (10 mg total) by mouth in  the morning, at noon, in the evening, and at bedtime., Disp: 120 tablet, Rfl: 0   QUEtiapine (SEROQUEL) 25 MG tablet, Take 1 tablet (25 mg total) by mouth at bedtime., Disp: 30 tablet, Rfl: 0   rosuvastatin  (CRESTOR ) 5 MG tablet, Take 5 mg by mouth daily with supper., Disp: , Rfl: 0   Semaglutide, 2 MG/DOSE, 8 MG/3ML SOPN, Inject into the skin., Disp: , Rfl:    spironolactone  (ALDACTONE ) 25 MG tablet, Take 0.5 tablets (12.5 mg total) by mouth daily., Disp: 45 tablet, Rfl: 3   triamcinolone  cream (KENALOG ) 0.1 %, Apply 1 Application topically 2 (two) times daily as needed. For rash on arms/legs, Disp: 453.6 g, Rfl: 0  Current Facility-Administered Medications:    sodium chloride  flush (NS) 0.9 % injection 3 mL, 3 mL, Intravenous, Q12H, Furth, Cadence H, PA-C  Allergies  Allergen Reactions   Azathioprine Other (See Comments)    Azathioprine hypersensitivity reaction - Symptoms mimicking sepsis - was hospitalized   Novolog  [Insulin  Aspart (Human Analog) (Yeast)] Hives    ROS  Ten systems reviewed and is negative except as mentioned in HPI    Objective  Vitals:   08/24/24 1105  BP: 106/68  Pulse: 95  Resp: 16  SpO2: 97%  Weight: 212 lb (96.2 kg)  Height: 5' 7 (1.702 m)    Body mass index is 33.2 kg/m.   Physical Exam CONSTITUTIONAL: Patient appears well-developed and well-nourished. No distress. HEENT: Head atraumatic, normocephalic, neck supple. CARDIOVASCULAR: Normal rate, regular rhythm and normal heart sounds. No murmur heard. No BLE edema. PULMONARY: Effort normal and breath sounds normal. No respiratory distress. ABDOMINAL: There is no tenderness or distention. MUSCULOSKELETAL: Normal gait. Without gross motor or sensory deficit. Thenar atrophy on hand. Left hand weakness. PSYCHIATRIC: Patient has a normal mood and affect. Behavior is normal. Judgment and thought content normal.  Recent Results (from the past 2160 hours)  Urine Microalbumin w/creat. ratio     Status:  None   Collection Time: 05/31/24 10:32 AM  Result Value Ref Range   Creatinine, Urine 60 20 - 320 mg/dL   Microalb, Ur 0.8 mg/dL    Comment: Reference Range Not established    Microalb Creat Ratio 13 <30 mg/g creat    Comment: . The ADA defines abnormalities in albumin excretion as follows: SABRA Albuminuria Category        Result (mg/g creatinine) . Normal to Mildly increased   <30 Moderately increased  30-299  Severely increased           > OR = 300 . The ADA recommends that at least two of three specimens collected within a 3-6 month period be abnormal before considering a patient to be within a diagnostic category.   Iron , TIBC and Ferritin Panel     Status: Abnormal   Collection Time: 05/31/24 10:32 AM  Result Value Ref Range   Iron  56 50 - 180 mcg/dL   TIBC 647 749 - 574 mcg/dL (calc)   %SAT 16 (L) 20 - 48 % (calc)   Ferritin 12 (L) 24 - 380 ng/mL  ToxASSURE Select 13 (MW), Urine     Status: None   Collection Time: 08/15/24  3:07 PM  Result Value Ref Range   Summary FINAL     Comment: ==================================================================== ToxASSU[employer] Select 13 (MW) ==================================================================== Test                             Result       Flag       Units  Drug Absent but Declared for Prescription Verification   Oxycodone                       Not Detected UNEXPECTED ng/mg creat ==================================================================== Test                      Result    Flag   Units      Ref Range   Creatinine              57               mg/dL      >=79 ==================================================================== Declared Medications:  The flagging and interpretation on this report are based on the  following declared medications.  Unexpected results may arise from  inaccuracies in the declared medications.   **Note: The testing scope of this panel includes these medications:    Oxycodone    **Note: The testing scope of this panel does not include the  following reported medi cations:   Albuterol   Aspirin   Buspirone  Clopidogrel   Cyclobenzaprine   Duloxetine   Empagliflozin  (Jardiance )  Ezetimibe  (Zetia )  Furosemide  (Lasix )  Gabapentin   Insulin  (Humalog )  Ketoconazole  (Nizoral )  Levocetirizine (Xyzal )  Levothyroxine  (Synthroid )  Lubiprostone  (Amitiza )  Metformin   Montelukast  (Singulair )  Naloxone  (Narcan )  Nitroglycerin  (Nitrostat )  Quetiapine (Seroquel)  Rosuvastatin  (Crestor )  Semaglutide  Spironolactone  (Aldactone )  Topical  Triamcinolone  (Kenalog ) ==================================================================== For clinical consultation, please call [phone]. ====================================================================         08/24/2024   11:04 AM 08/23/2024   11:02 AM 08/15/2024    8:20 AM  PHQ9 SCORE ONLY  PHQ-9 Total Score 6  0     Information is confidential and restricted. Go to Review Flowsheets to unlock data.    Assessment & Plan Major depressive disorder, recurrent, moderate Chronic depression exacerbated by recent bereavement. On duloxetine  90 mg. - Continue duloxetine  90 mg daily. - Encouraged continuation of therapy sessions.  Panic disorder Daily panic attacks. Buspirone ineffective. Hydroxyzine considered. Discussed beta blockers and non-pharmacological strategies. - Discontinued buspirone. - Prescribed hydroxyzine as needed for panic attacks. - Encouraged deep breathing exercises during panic attacks. - Discussed potential referral to psychiatrist for further management.  Type 2 diabetes mellitus with diabetic polyneuropathy Diabetes managed with insulin  pump. Neuropathy stable. Discussed pump options. - Continue insulin  pump therapy. - Discuss potential switch  to a pump without tubing with endocrinologist.  Iron  deficiency anemia Recent hemoglobin drop. Previous workup negative. On iron   supplementation. - Ordered CBC and iron  studies to assess current anemia status. - Continue iron  supplementation. - Will consider referral to GI if anemia persists or worsens.  Autoimmune thyroiditis Thyroid  function to be checked for symptom contribution. - Ordered thyroid  function tests.  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 Injection provided relief. Plans for physical therapy. - Continue physical therapy for shoulder pain management.  Adjustment insomnia Sleep improved with current regimen. - Continue current sleep medication regimen. - Provided 90-day refill for sleep medication.

## 2024-08-25 ENCOUNTER — Ambulatory Visit: Payer: Self-pay | Admitting: Family Medicine

## 2024-08-25 LAB — CBC WITH DIFFERENTIAL/PLATELET
Absolute Lymphocytes: 1351 {cells}/uL (ref 850–3900)
Absolute Monocytes: 540 {cells}/uL (ref 200–950)
Basophils Absolute: 29 {cells}/uL (ref 0–200)
Basophils Relative: 0.4 %
Eosinophils Absolute: 190 {cells}/uL (ref 15–500)
Eosinophils Relative: 2.6 %
HCT: 44.3 % (ref 38.5–50.0)
Hemoglobin: 14.2 g/dL (ref 13.2–17.1)
MCH: 29.3 pg (ref 27.0–33.0)
MCHC: 32.1 g/dL (ref 32.0–36.0)
MCV: 91.3 fL (ref 80.0–100.0)
MPV: 10.5 fL (ref 7.5–12.5)
Monocytes Relative: 7.4 %
Neutro Abs: 5190 {cells}/uL (ref 1500–7800)
Neutrophils Relative %: 71.1 %
Platelets: 209 Thousand/uL (ref 140–400)
RBC: 4.85 Million/uL (ref 4.20–5.80)
RDW: 13.9 % (ref 11.0–15.0)
Total Lymphocyte: 18.5 %
WBC: 7.3 Thousand/uL (ref 3.8–10.8)

## 2024-08-25 LAB — IRON,TIBC AND FERRITIN PANEL
%SAT: 23 % (ref 20–48)
Ferritin: 29 ng/mL (ref 24–380)
Iron: 81 ug/dL (ref 50–180)
TIBC: 348 ug/dL (ref 250–425)

## 2024-08-25 LAB — TSH: TSH: 2.23 m[IU]/L (ref 0.40–4.50)

## 2024-09-04 ENCOUNTER — Telehealth: Payer: Self-pay

## 2024-09-04 NOTE — Telephone Encounter (Signed)
 Pharmacy Quality Measure Review  This patient is appearing on the insurance-providing list for being at risk of failing the adherence measure for Statin Use in Persons with Diabetes (SUPD) medications this calendar year.   Medication: rosuvastatin  5 mg Last fill date: 06/28/23  Per patient, he has been taking this medication on a daily basis. Per Dr. Annemarie and [facility] pharmacy, unable to find any recent fill history in 2024. Endocrinology has been historically prescribing this medication. Pharmacy requested refill today from prescriber as script has expired.  Arron Tetrault E. Marsh, PharmD, CPP Clinical Pharmacist [facility] [phone]

## 2024-09-05 ENCOUNTER — Ambulatory Visit: Admitting: Licensed Clinical Social Worker
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
## 2024-09-11 ENCOUNTER — Ambulatory Visit

## 2024-09-11 NOTE — Progress Notes (Deleted)
 Orthopedic Follow-Up Note  Right shoulder pain`   SUBJECTIVE:   [name] is a 63 y.o. year old who presents for follow-up regarding right rotator cuff tendinitis and right AC joint arthritis.  Seen at [facility] on 08/02/2024, just under 6 weeks ago.  Patient received subacromial corticosteroid injection in office.  Patient also referred for physical therapy.     Past Medical History:  Diagnosis Date   Allergy    dust, seasonal (worse in the fall).   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 Followed by Pain Specialist in [place], back and neck   Asthma    BRONCHITIS   Cataract    First Dx in 2012   Chronic combined systolic and diastolic congestive heart failure (HCC)    a. 03/2018 Echo: EF 30-35%; b. 07/2018 Echo: EF 35-40%; c. 09/2019 TEE: EF 40-45%; d. 12/2020 Echo: EF 35-40%; e.01/2022  Echo: EF 45-50%. sev apical HK; f. 01/2023 Echo: EF 40-45%, mod asymm basal-septal LVH, apical AK, nl RV fxn.   CKD (chronic kidney disease), stage II    Coronary artery disease    a. Prior Ant MI->s/p mult stents->LAD/RCA ([place]); b. 2016 Cath: nonobs dzs;  c. 04/2018 Cath/PCI: LCX 10m (3.25x15 Sierra DES); d. 02/2022 MV: high risk; e. 02/2022 Cath: LM nl, LAD 20p ISR, patent mid-stent, LCX patent stent, OM1 nl, OM2 50p, patent stent, OM3 40p, patent stent, RCA 40p ISR, 36m ISR, 63m/d, patent distal stent, RPDA patent stent, RPAV small, 80 (sl progression)-->Med Rx.   Deaf, left    Diabetes mellitus without complication (HCC)    TYPE 2   Diabetic peripheral neuropathy (HCC)    feet and hands   FUO (fever of unknown origin) 08/03/2018   GERD (gastroesophageal reflux disease)    Headache    muscle tension   Hyperlipidemia    Hypertension    Hyperthyroidism    Insomnia    Ischemic cardiomyopathy    a. 03/2018 Echo: EF 30-35%; b. 07/2018 Echo: EF 35-40%, Gr1 DD; c. 09/2019 TEE: EF 40-45%; d. 12/2020 Echo: EF 35-40%. GrI DD; e. 01/2022 Echo: EF 45-50%; f. 01/2023 Echo: EF 40-45%.   Knee pain, acute  05/06/2020   Left arm weakness 10/04/2019   Left leg weakness 12/01/2019   Lyme disease    Chronic   Myasthenia gravis (HCC)    (03/18/21 - no current treatment - better than it has ever been per pt)   Myocardial infarction (HCC) 2010   Palpitations    a. 10/2020 Zio: RSR, 88 avg. 3 brief SVT episodes (max 5 beats @ 128). Rare PACs/PVCs. Triggered events did not correlate w/ significant arrthymia - some w/ sinus tach.   Seasonal allergies    Sepsis (HCC)    a.07/2018 - unknown source. TEE neg for veg 09/2019.   Sleep apnea    CPAP   Wears hearing aid in both ears    Past Surgical History:  Procedure Laterality Date   BILATERAL CARPAL TUNNEL RELEASE Bilateral L in 2012 and R in 2013   CARDIAC CATHETERIZATION     Several Caths, most recent in  March 2016.   COLONOSCOPY WITH PROPOFOL  N/A 01/10/2016   Procedure: COLONOSCOPY WITH PROPOFOL ;  Surgeon: Rogelia Copping, MD;  Location: [facility] ENDOSCOPY;  Service: Endoscopy;  Laterality: N/A;   COLONOSCOPY WITH PROPOFOL  N/A 04/14/2021   Procedure: COLONOSCOPY WITH PROPOFOL ;  Surgeon: Copping Rogelia, MD;  Location: [facility];  Service: Endoscopy;  Laterality: N/A;  Diabetic - insulin   and oral meds   CORONARY ANGIOPLASTY     CORONARY STENT INTERVENTION N/A 04/25/2018   Procedure: CORONARY STENT INTERVENTION;  Surgeon: Darron Deatrice LABOR, MD;  Location: [facility] IN[facility];  Service: Cardiovascular;  Laterality: N/A;   ESOPHAGOGASTRODUODENOSCOPY (EGD) WITH PROPOFOL  N/A 01/10/2016   Procedure: ESOPHAGOGASTRODUODENOSCOPY (EGD) WITH PROPOFOL ;  Surgeon: Rogelia Copping, MD;  Location: [facility] ENDOSCOPY;  Service: Endoscopy;  Laterality: N/A;   ESOPHAGOGASTRODUODENOSCOPY (EGD) WITH PROPOFOL  N/A 04/14/2021   Procedure: ESOPHAGOGASTRODUODENOSCOPY (EGD) WITH BIOPSY;  Surgeon: Copping Rogelia, MD;  Location: [facility];  Service: Endoscopy;  Laterality: N/A;   EYE SURGERY Bilateral 2012   cataract/bilateral vitrectomies   GIVENS CAPSULE STUDY N/A 08/20/2021    Procedure: GIVENS CAPSULE STUDY;  Surgeon: Copping Rogelia, MD;  Location[facility] END[facility];  Service: Endoscopy;  Laterality: N/A;   LEFT HEART CATH AND CORONARY ANGIOGRAPHY Left 04/25/2018   Procedure: LEFT HEART CATH AND CORONARY ANGIOGRAPHY;  Surgeon: Darron Deatrice LABOR, MD;  Location: [facility];  Service: Cardiovascular;  Laterality: Left;   LEFT HEART CATH AND CORONARY ANGIOGRAPHY Left 02/02/2022   Procedure: LEFT HEART CATH AND CORONARY ANGIOGRAPHY;  Surgeon: Darron Deatrice LABOR, MD;  Location: [facility];  Service: Cardiovascular;  Laterality: Left;   TEE WITHOUT CARDIOVERSION N/A 09/05/2018   Procedure: TRANSESOPHAGEAL ECHOCARDIOGRAM (TEE);  Surgeon: Darron Deatrice LABOR, MD;  Location: [facility] ORS;  Service: Cardiovascular;  Laterality: N/A;   TONSILLECTOMY AND ADENOIDECTOMY     As a child   TUNNELED VENOUS CATHETER PLACEMENT     removed    Current Outpatient Medications:    albuterol  (PROVENTIL ) (2.5 MG/3ML) 0.083% nebulizer solution, Take 3 mLs (2.5 mg total) by nebulization every 6 (six) hours as needed for wheezing or shortness of breath., Disp: 75 mL, Rfl: 2   albuterol  (VENTOLIN  HFA) 108 (90 Base) MCG/ACT inhaler, Inhale 2 puffs into the lungs daily., Disp: , Rfl:    ammonium lactate  (AMLACTIN) 12 % lotion, Apply 1 Application topically as needed for dry skin., Disp: 400 g, Rfl: 3   aspirin  EC 81 MG tablet, Take 1 tablet (81 mg total) by mouth daily., Disp: 90 tablet, Rfl: 3   clopidogrel  (PLAVIX ) 75 MG tablet, TAKE 1 TABLET BY MOUTH DAILY WITH BREAKFAST, Disp: 90 tablet, Rfl: 3   cyclobenzaprine  (FLEXERIL ) 10 MG tablet, Take 1 tablet (10 mg total) by mouth 2 (two) times daily., Disp: 60 tablet, Rfl: 3   DULoxetine  (CYMBALTA ) 30 MG capsule, Take 1 capsule (30 mg total) by mouth daily., Disp: 90 capsule, Rfl: 1   DULoxetine  (CYMBALTA ) 60 MG capsule, Take 1 capsule (60 mg total) by mouth daily., Disp: 90 capsule, Rfl: 1   ezetimibe  (ZETIA ) 10 MG tablet, TAKE 1 TABLET(10 MG) BY  MOUTH DAILY, Disp: 90 tablet, Rfl: 2   furosemide  (LASIX ) 40 MG tablet, Take 1 tablet (40 mg total) by mouth daily., Disp: 90 tablet, Rfl: 3   gabapentin  (NEURONTIN ) 300 MG capsule, Take 1 capsule (300 mg total) by mouth 3 (three) times daily., Disp: 90 capsule, Rfl: 1   gabapentin  (NEURONTIN ) 800 MG tablet, Take 800 mg by mouth at bedtime., Disp: , Rfl:    HUMALOG  100 UNIT/ML injection, Inject 30-50 Units into the skin daily. Insulin  pump, Disp: , Rfl:    hydrOXYzine  (ATARAX ) 10 MG tablet, Take 1 tablet (10 mg total) by mouth daily as needed., Disp: 90 tablet, Rfl: 0   JARDIANCE  10 MG TABS tablet, TAKE 1 TABLET(10 MG) BY MOUTH DAILY BEFORE BREAKFAST, Disp: 90  tablet, Rfl: 3   ketoconazole  (NIZORAL ) 2 % cream, Apply 1 Application topically daily. For rash on face, Disp: 60 g, Rfl: 0   levocetirizine (XYZAL ) 5 MG tablet, Take 1 tablet (5 mg total) by mouth daily at 12 noon., Disp: 90 tablet, Rfl: 1   levothyroxine  (SYNTHROID ) 50 MCG tablet, TAKE 1 TABLET BY MOUTH EVERY DAY ON MONDAY TO SATURDAY AND 2 TABLETS ON SUNDAY, Disp: 102 tablet, Rfl: 0   lubiprostone  (AMITIZA ) 8 MCG capsule, TAKE 1 CAPSULE(8 MCG) BY MOUTH TWICE DAILY WITH A MEAL, Disp: 180 capsule, Rfl: 1   metFORMIN  (GLUCOPHAGE ) 1000 MG tablet, Take 1,000 mg by mouth 2 (two) times daily with a meal., Disp: , Rfl:    montelukast  (SINGULAIR ) 10 MG tablet, Take 10 mg by mouth at bedtime., Disp: , Rfl:    Multiple Vitamins-Minerals (MENS MULTI VITAMIN & MINERAL PO), Take 1 tablet by mouth in the morning and at bedtime., Disp: , Rfl:    naloxone  (NARCAN ) nasal spray 4 mg/0.1 mL, For excess sedation from opioids, Disp: 1 kit, Rfl: 2   nitroGLYCERIN  (NITROSTAT ) 0.3 MG SL tablet, DISSOLVE 1 TABLET UNDER THE TONGUE EVERY 5 MINUTES AS NEEDED FOR CHEST PAIN, MAX 3 DOSES, Disp: 25 tablet, Rfl: 0   Oxycodone  HCl 10 MG TABS, Take 1 tablet (10 mg total) by mouth in the morning, at noon, in the evening, and at bedtime., Disp: 120 tablet, Rfl: 0   [START ON  09/20/2024] Oxycodone  HCl 10 MG TABS, Take 1 tablet (10 mg total) by mouth in the morning, at noon, in the evening, and at bedtime., Disp: 120 tablet, Rfl: 0   QUEtiapine  (SEROQUEL ) 25 MG tablet, Take 1 tablet (25 mg total) by mouth at bedtime., Disp: 90 tablet, Rfl: 0   rosuvastatin  (CRESTOR ) 5 MG tablet, Take 5 mg by mouth daily with supper., Disp: , Rfl: 0   Semaglutide, 2 MG/DOSE, 8 MG/3ML SOPN, Inject into the skin., Disp: , Rfl:    spironolactone  (ALDACTONE ) 25 MG tablet, Take 0.5 tablets (12.5 mg total) by mouth daily., Disp: 45 tablet, Rfl: 3   triamcinolone  cream (KENALOG ) 0.1 %, Apply 1 Application topically 2 (two) times daily as needed. For rash on arms/legs, Disp: 453.6 g, Rfl: 0  Current Facility-Administered Medications:    sodium chloride  flush (NS) 0.9 % injection 3 mL, 3 mL, Intravenous, Q12H, Furth, Cadence H, PA-C Allergies  Allergen Reactions   Azathioprine Other (See Comments)    Azathioprine hypersensitivity reaction - Symptoms mimicking sepsis - was hospitalized   Novolog  [Insulin  Aspart (Human Analog) (Yeast)] Hives   Social History   Socioeconomic History   Marital status: Widowed    Spouse name: Barnie   Number of children: 0   Years of education: Not on file   Highest education level: Doctorate  Occupational History   Occupation: disabled    Comment: multiple medical problems - first approved for uncontrolled DM, but now has heart disease and Myasthenia Gravis   Tobacco Use   Smoking status: Never   Smokeless tobacco: Never   Tobacco comments:    smoking cessation materials not required  Vaping Use   Vaping status: Never Used  Substance and Sexual Activity   Alcohol use: Not Currently    Alcohol/week: 0.0 standard drinks of alcohol   Drug use: No   Sexual activity: Not Currently    Partners: Female  Other Topics Concern   Not on file  Social History Narrative   Not on file   Social Drivers  of Health   Financial Resource Strain: Medium Risk  (08/04/2024)   Overall Financial Resource Strain (CARDIA)    Difficulty of Paying Living Expenses: Somewhat hard  Food Insecurity: No Food Insecurity (07/24/2024)   Hunger Vital Sign    Worried About Running Out of Food in the Last Year: Never true    Ran Out of Food in the Last Year: Never true  Transportation Needs: No Transportation Needs (08/04/2024)   PRAPARE - Administrator, Civil Service (Medical): No    Lack of Transportation (Non-Medical): No  Physical Activity: Sufficiently Active (08/04/2024)   Exercise Vital Sign    Days of Exercise per Week: 5 days    Minutes of Exercise per Session: 40 min  Recent Concern: Physical Activity - Insufficiently Active (07/24/2024)   Exercise Vital Sign    Days of Exercise per Week: 3 days    Minutes of Exercise per Session: 20 min  Stress: Stress Concern Present (07/24/2024)   [employer] of Occupational Health - Occupational Stress Questionnaire    Feeling of Stress: Very much  Social Connections: Moderately Integrated (08/04/2024)   Social Connection and Isolation Panel    Frequency of Communication with Friends and Family: Three times a week    Frequency of Social Gatherings with Friends and Family: Never    Attends Religious Services: 1 to 4 times per year    Active Member of Clubs or Organizations: No    Attends Banker Meetings: 1 to 4 times per year    Marital Status: Widowed  Recent Concern: Social Connections - Moderately Isolated (07/24/2024)   Social Connection and Isolation Panel    Frequency of Communication with Friends and Family: Three times a week    Frequency of Social Gatherings with Friends and Family: Never    Attends Religious Services: 1 to 4 times per year    Active Member of [employer] or Organizations: No    Attends Banker Meetings: Not on file    Marital Status: Widowed  Intimate Partner Violence: Not At Risk (08/04/2024)   Humiliation, Afraid, Rape, and Kick  questionnaire    Fear of Current or Ex-Partner: No    Emotionally Abused: No    Physically Abused: No    Sexually Abused: No   Family History  Problem Relation Age of Onset   Anxiety disorder Mother    Depression Mother    Diabetes Mother    Heart disease Mother    Cancer Father        Prostate CA, Anal cancer    Dementia Father    Diabetes Brother    Healthy Brother    Healthy Brother      ROS: A review of systems was performed and is negative unless stated above in HPI    OBJECTIVE:    Constitutional:   The patient is alert and oriented x 3, appears to be stated age and in no distress.   Orthopaedic Examination:  ***   IMAGING:   Xrays: ***  Radiology Reading: Right Shoulder Xray 08/02/2024 IMPRESSION: 1. Moderate glenohumeral and acromioclavicular joint degenerative change.  Cervical Spine Xray 08/02/2024 IMPRESSION: 1. No acute abnormality of the cervical spine. 2. Evaluation is limited due to overlapping osseous structures and overlying soft tissues.     ASSESSMENT:  ***     PLAN:   Patient was seen in office today for follow up of ***. Since last encounter, patient reports ***. Interventions attempted include ***   1.  Progressing {Progressing:33468} since last encounter in office  2. Recommend ***. 3. Follow-up in clinic {Follow-up:33465} 4. All questions and concerns were answered to the best of my ability.  Patient can call any time with further concerns.   Arlyss Schneider, DO Orthopedic Surgery & Sports Medicine [facility]

## 2024-09-12 ENCOUNTER — Other Ambulatory Visit: Payer: Self-pay | Admitting: Family Medicine

## 2024-09-12 DIAGNOSIS — E063 Autoimmune thyroiditis: Secondary | ICD-10-CM

## 2024-09-13 NOTE — Telephone Encounter (Signed)
 Requested Prescriptions  Pending Prescriptions Disp Refills   levothyroxine  (SYNTHROID ) 50 MCG tablet [Pharmacy Med Name: LEVOTHYROXINE  0.05MG  ( ) TAB] 102 tablet 0    Sig: TAKE 1 TABLET BY MOUTH EVERY DAY ON MONDAY TO SATURDAY AND 2 TABLETS ON SUNDAY     Endocrinology:  Hypothyroid Agents Passed - 09/13/2024  4:28 PM      Passed - TSH in normal range and within 360 days    TSH  Date Value Ref Range Status  08/24/2024 2.23 0.40 - 4.50 mIU/L Final         Passed - Valid encounter within last 12 months    Recent Outpatient Visits           2 weeks ago Moderate recurrent major depression [facility])   [facility] Glenard Mire, MD   1 month ago Major depression, recurrent, chronic   [facility] [place], Mire, MD   3 months ago Type 2 diabetes mellitus with diabetic polyneuropathy, with long-term current use of insulin  [facility])   [facility] Glenard Mire, MD   4 months ago Other eczema   [facility] Glenard Mire, MD   4 months ago Shortness of breath   [facility] Gareth Mliss [place], [place]       Future Appointments             In 4 weeks Vivienne, Lonni Ingle, NP [facility] [place]   In 2 months Sowles, Krichna, MD [facility], [place]

## 2024-09-21 ENCOUNTER — Ambulatory Visit: Admitting: Licensed Clinical Social Worker

## 2024-09-21 DIAGNOSIS — Z91199 Patient's noncompliance with other medical treatment and regimen due to unspecified reason: Secondary | ICD-10-CM

## 2024-09-21 NOTE — Progress Notes (Signed)
 Clinician attempted session via face-to-face, but [name] did not appear for his session. Reports he is sick.

## 2024-09-22 ENCOUNTER — Other Ambulatory Visit: Payer: Self-pay | Admitting: Family Medicine

## 2024-09-22 DIAGNOSIS — F330 Major depressive disorder, recurrent, mild: Secondary | ICD-10-CM

## 2024-09-26 NOTE — Telephone Encounter (Signed)
 Too soon for refill, LRF 05/31/24 FOR 90 and 1 refill.  Requested Prescriptions  Pending Prescriptions Disp Refills   DULoxetine  (CYMBALTA ) 60 MG capsule [Pharmacy Med Name: DULOXETINE  DR 60MG  CAPSULES] 90 capsule 1    Sig: TAKE 1 CAPSULE(60 MG) BY MOUTH DAILY     Psychiatry: Antidepressants - SNRI - duloxetine  Failed - 09/26/2024 11:12 AM      Failed - Cr in normal range and within 360 days    Creat  Date Value Ref Range Status  04/27/2024 1.41 (H) 0.70 - 1.35 mg/dL Final   Creatinine, Urine  Date Value Ref Range Status  05/31/2024 60 20 - 320 mg/dL Final         Passed - eGFR is 30 or above and within 360 days    GFR, Est African American  Date Value Ref Range Status  10/16/2019 52 (L) > OR = 60 mL/min/1.40m2 Final   GFR calc Af Amer  Date Value Ref Range Status  09/17/2020 61 >59 mL/min/1.73 Final    Comment:    **In accordance with recommendations from the NKF-ASN Task force,**   [facility] is in the process of updating its eGFR calculation to the   2021 CKD-EPI creatinine equation that estimates kidney function   without a race variable.    GFR, Est Non African American  Date Value Ref Range Status  10/16/2019 45 (L) > OR = 60 mL/min/1.20m2 Final   GFR, Estimated  Date Value Ref Range Status  11/25/2022 >60 >60 mL/min Final    Comment:    (NOTE) Calculated using the CKD-EPI Creatinine Equation (2021)    eGFR  Date Value Ref Range Status  04/27/2024 56 (L) > OR = 60 mL/min/1.75m2 Final  12/29/2023 57 (L) >59 mL/min/1.73 Final         Passed - Completed PHQ-2 or PHQ-9 in the last 360 days      Passed - Last BP in normal range    BP Readings from Last 1 Encounters:  08/24/24 106/68         Passed - Valid encounter within last 6 months    Recent Outpatient Visits           1 month ago Moderate recurrent major depression [facility])   [facility] Glenard Mire, MD   2 months ago Major depression, recurrent, chronic   [place]  [facility] [place], Mire, MD   3 months ago Type 2 diabetes mellitus with diabetic polyneuropathy, with long-term current use of insulin  [facility])   [place] [facility] Glenard Mire, MD   4 months ago Other eczema   [facility] Sowles, Krichna, MD   5 months ago Shortness of breath   [facility] Gareth Mliss [place], [place]       Future Appointments             In 2 weeks Vivienne, Lonni Ingle, NP [facility] [place]   In 2 months Sowles, Krichna, MD [facility], [place]

## 2024-10-02 NOTE — Progress Notes (Unsigned)
 "  THERAPIST PROGRESS NOTE  Session Time: 1:05-1:50pm  Participation Level: Active  Behavioral Response: CasualAlertAnxious and Euthymic  Type of Therapy: Individual Therapy  Treatment Goals addressed:  Active     OP Depression     LTG: Reduce frequency, intensity, and duration of depression symptoms so that daily functioning is improved (Progressing)     Start:  07/27/24    Expected End:  10/27/24         LTG: Increase coping skills to manage depression and improve ability to perform daily activities (Progressing)     Start:  07/27/24    Expected End:  10/27/24         STG: [name] will identify cognitive patterns and beliefs that support depression (Progressing)     Start:  07/27/24    Expected End:  10/27/24         Demonstrates progress in stages of grief at own pace (Progressing)     Start:  07/27/24    Expected End:  10/27/24            Work with [name] to identify the major components of a recent episode of depression: physical symptoms, major thoughts and images, and major behaviors they experienced     Start:  07/27/24         [name] will identify 2 cognitive distortions they are currently using and write reframing statements to replace them     Start:  07/27/24         Coping Skills     Start:  07/27/24       Will work with the pt using CBT/DBT techniques to help the pt verbalize an understanding of the cognitive, physiological, and behavioral components of depression and its treatment. This will be done by using worksheets, interactive activities, CBT/ABC thought logs, modeling, homework, role playing and journaling. Will work with pt to learn and implement coping skills that result in a reduction of depression and improve daily functioning per pt self-report 3 out of 5 documented sessions.       Educate patient on: Stages of grief     Start:  07/27/24             ProgressTowards Goals: Progressing  Interventions:  Supportive, Reframing, and Other: EMDR  Summary: [name] is a 63 y.o. male who presents with symptoms of anxiety. Patient reports symptoms to include difficulty falling and staying asleep, avoidance, restlessness, tearfulness, low mood. Pt was oriented times 5. Pt was cooperative and engaged. Pt denies SI/HI/AVH.    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  He reported that Christmas was difficult for him, and he has been coping by staying busy. He specifically mentioned that nights are particularly hard for him because he often snaps awake and struggles to fall back asleep for hours. He believes this pattern serves as a reminder of his wife's death occurring in the middle of the night. The clinician worked with him to explore strategies to address his restlessness during these hours.  The patient consented to using Eye Movement Desensitization and Reprocessing (EMDR) therapy to process the trauma related to his wife's unexpected passing and the experience of finding her in the middle of the night. The clinician and patient focused on clearing the misplaced guilt and the negative belief, I should have  been there for her. They worked together to instill a new belief: It was her time, and I can accept that I had no control over how it happened. The patient reported a decrease in his subjective units of distress, moving from a score of 8 to a score of 4. He reflected on realizations that brought him peace regarding the situation and expressed that he has released his feelings of guilt.  Suicidal/Homicidal: Nowithout intent/plan  Therapist Response: Cln utilized active and supportive reflection to create safe environment for patient to process recent life stressors. Clinician assessed for current symptoms, stressors, safety since  last session.  Worked with patient to identify solutions to improving sleep hygiene.  Briefly engaged patient in EMDR to challenge cognitions related to misplaced guilt.  Plan: Return again in 2 weeks.  Diagnosis: MDD (major depressive disorder), recurrent episode, moderate (HCC)  Recent bereavement   Collaboration of Care: Other Meet with LCSW per availability.  Patient/Guardian was advised Release of Information must be obtained prior to any record release in order to collaborate their care with an outside provider. Patient/Guardian was advised if they have not already done so to contact the registration department to sign all necessary forms in order for us  to release information regarding their care.   Consent: Patient/Guardian gives verbal consent for treatment and assignment of benefits for services provided during this visit. Patient/Guardian expressed understanding and agreed to proceed.   Edgar KATHEE Husband, LCSW 10/02/2024  "

## 2024-10-03 ENCOUNTER — Ambulatory Visit: Admitting: Licensed Clinical Social Worker

## 2024-10-03 DIAGNOSIS — F331 Major depressive disorder, recurrent, moderate: Secondary | ICD-10-CM | POA: Diagnosis not present

## 2024-10-03 DIAGNOSIS — Z634 Disappearance and death of family member: Secondary | ICD-10-CM

## 2024-10-11 ENCOUNTER — Ambulatory Visit: Admitting: Family Medicine

## 2024-10-11 ENCOUNTER — Encounter: Payer: Self-pay | Admitting: Family Medicine

## 2024-10-11 VITALS — BP 110/68 | HR 98 | Resp 16 | Ht 67.0 in | Wt 213.8 lb

## 2024-10-11 DIAGNOSIS — G7000 Myasthenia gravis without (acute) exacerbation: Secondary | ICD-10-CM

## 2024-10-11 DIAGNOSIS — J441 Chronic obstructive pulmonary disease with (acute) exacerbation: Secondary | ICD-10-CM | POA: Diagnosis not present

## 2024-10-11 DIAGNOSIS — J4551 Severe persistent asthma with (acute) exacerbation: Secondary | ICD-10-CM | POA: Diagnosis not present

## 2024-10-11 MED ORDER — AZITHROMYCIN 250 MG PO TABS: ORAL_TABLET | ORAL | 0 refills | Status: AC

## 2024-10-11 MED ORDER — PREDNISONE 20 MG PO TABS: 20.0000 mg | ORAL_TABLET | Freq: Two times a day (BID) | ORAL | 0 refills | Status: AC

## 2024-10-11 NOTE — Progress Notes (Signed)
 Name: [name]   MRN: [id number]    DOB: 1961-06-14   Date:10/11/2024       Progress Note  Subjective  Chief Complaint  Chief Complaint  Patient presents with   Nasal Congestion    Chest congestion, x4-5 days ago   Wheezing   Generalized Body Aches    More like muscle pain per patient    Discussed the use of AI scribe software for clinical note transcription with the patient, who gave verbal consent to proceed.  History of Present Illness [name] is a 64 year old male with severe persistent asthma who presents with respiratory congestion and body aches.  He has experienced significant chest congestion over the past five days, which is not severe at the moment but is concerning due to his history of asthma. His breathing worsens at night and upon waking in the morning, and he is currently coughing up green phlegm.  He has a history of severe persistent asthma and uses Trelegy for maintenance, along with Ventolin  and albuterol  at home. He previously used Breo but switched to Trelegy about one year ago . He has not seen his lung doctor since January of last year due to personal circumstances. He was treated with Augmentin  and prednisone  for a flare last January.  He experiences body aches but no fever or chills. He mentions that his myasthenia gravis might contribute to increased fatigue and muscle aches when he is ill. He has noticed some eye issues but does not find them severe enough to seek immediate medical attention.  He takes hydroxyzine  once a day for anxiety, which he finds immensely helpful, especially in the evenings when his anxiety tends to increase. He has been dealing with additional stress related to managing his late wife's affairs, which has contributed to daytime anxiety.  He spends a lot of time outdoors with his dog.    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) 12/02/2021   Gastropathy 08/13/2021   Iron  deficiency anemia due to chronic blood loss    Pain due to onychomycosis of toenails of both feet 11/11/2020   Lung involvement associated with another disorder (HCC) 05/17/2020   Lower urinary tract symptoms 09/15/2019   Incomplete bladder emptying 09/15/2019   Carpal tunnel syndrome on both sides 07/12/2019   Idiopathic peripheral neuropathy 04/21/2019   Sensory ataxia 03/09/2019   OSA on CPAP 07/08/2018   Congestive heart failure (CHF) (HCC) 05/05/2018   Unstable angina (HCC) 04/26/2018   Ischemic cardiomyopathy 04/26/2018   Chronic combined systolic and diastolic CHF (congestive heart failure) (HCC) 04/26/2018   Anginal equivalent 04/25/2018   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/25/2018   Vitamin D  deficiency, unspecified 08/12/2017   Ptosis of left eyelid 01/08/2017   Dermatitis 12/24/2016   Benign neoplasm of sigmoid colon    Benign neoplasm of descending colon    Benign neoplasm of transverse colon    Coronary artery disease involving native coronary artery with angina pectoris 10/22/2015   Coronary artery disease 08/22/2015   Myasthenia gravis (HCC) 08/22/2015   Chronic left-sided low back pain  08/22/2015   Hypertension 08/22/2015   GERD (gastroesophageal reflux disease) 08/22/2015   Hyperlipidemia 08/22/2015   Insomnia 08/22/2015   Type 2 diabetes mellitus with diabetic polyneuropathy, with long-term current use of insulin  (HCC) 08/22/2015   Lyme disease 06/05/1992    Social History   Tobacco Use   Smoking status: Never   Smokeless tobacco:  Never   Tobacco comments:    smoking cessation materials not required  Substance Use Topics   Alcohol use: Not Currently    Alcohol/week: 0.0 standard drinks of alcohol    Current Medications[1]  Allergies[2]  ROS  Ten systems reviewed and is negative except as mentioned in HPI    Objective  Vitals:   10/11/24 1111  BP: 110/68  Pulse: 98  Resp: 16  SpO2: 94%  Weight: 213 lb 12.8 oz (97 kg)  Height: 5' 7 (1.702 m)    Body mass index is 33.49 kg/m.   Physical Exam CONSTITUTIONAL: Patient appears well-developed and well-nourished. No distress. HEENT: Head atraumatic, normocephalic, neck supple. CARDIOVASCULAR: Normal rate, regular rhythm and normal heart sounds. No murmur heard. No BLE edema. PULMONARY: Effort normal. Wheezing present, no crackles heard. No respiratory distress. ABDOMINAL: There is no tenderness or distention. MUSCULOSKELETAL: Normal gait. Without gross motor or sensory deficit. PSYCHIATRIC: Patient has a normal mood and affect. Behavior is normal. Judgment and thought content normal.  Recent Results (from the past 2160 hours)  [employer] Select 13 (MW), Urine     Status: None   Collection Time: 08/15/24  3:07 PM  Result Value Ref Range   Summary FINAL     Comment: ==================================================================== [employer] Select 13 (MW) ==================================================================== Test                             Result       Flag       Units  Drug Absent but Declared for Prescription Verification   Oxycodone                       Not Detected UNEXPECTED ng/mg creat ==================================================================== Test                      Result    Flag   Units      Ref Range   Creatinine              57               mg/dL      >=79 ==================================================================== Declared Medications:  The flagging and interpretation on this report are based  on the  following declared medications.  Unexpected results may arise from  inaccuracies in the declared medications.   **Note: The testing scope of this panel includes these medications:   Oxycodone    **Note: The testing scope of this panel does not include the  following reported medi cations:   Albuterol   Aspirin   Buspirone   Clopidogrel   Cyclobenzaprine   Duloxetine   Empagliflozin  (Jardiance )  Ezetimibe  (Zetia )  Furosemide  (Lasix )  Gabapentin   Insulin  (Humalog )  Ketoconazole  (Nizoral )  Levocetirizine (Xyzal )  Levothyroxine  (Synthroid )  Lubiprostone  (Amitiza )  Metformin   Montelukast  (Singulair )  Naloxone  (Narcan )  Nitroglycerin  (Nitrostat )  Quetiapine  (Seroquel )  Rosuvastatin  (Crestor )  Semaglutide  Spironolactone  (Aldactone )  Topical  Triamcinolone  (Kenalog ) ==================================================================== For clinical consultation, please call [phone]. ====================================================================   TSH     Status: None   Collection Time: 08/24/24 11:56 AM  Result Value Ref  Range   TSH 2.23 0.40 - 4.50 mIU/L  CBC with Differential/Platelet     Status: None   Collection Time: 08/24/24 11:56 AM  Result Value Ref Range   WBC 7.3 3.8 - 10.8 Thousand/uL   RBC 4.85 4.20 - 5.80 Million/uL   Hemoglobin 14.2 13.2 - 17.1 g/dL   HCT 55.6 61.4 - 49.9 %   MCV 91.3 80.0 - 100.0 fL   MCH 29.3 27.0 - 33.0 pg   MCHC 32.1 32.0 - 36.0 g/dL    Comment: For adults, a slight decrease in the calculated MCHC value (in the range of 30 to 32 g/dL) is most likely not clinically significant; however, it should be interpreted with caution in correlation with other red cell parameters and the patient's clinical condition.    RDW 13.9 11.0 - 15.0 %   Platelets 209 140 - 400 Thousand/uL   MPV 10.5 7.5 - 12.5 fL   Neutro Abs 5,190 1,500 - 7,800 cells/uL   Absolute Lymphocytes 1,351 850 - 3,900 cells/uL   Absolute Monocytes 540 200  - 950 cells/uL   Eosinophils Absolute 190 15 - 500 cells/uL   Basophils Absolute 29 0 - 200 cells/uL   Neutrophils Relative % 71.1 %   Total Lymphocyte 18.5 %   Monocytes Relative 7.4 %   Eosinophils Relative 2.6 %   Basophils Relative 0.4 %  Iron , TIBC and Ferritin Panel     Status: None   Collection Time: 08/24/24 11:56 AM  Result Value Ref Range   Iron  81 50 - 180 mcg/dL   TIBC 651 749 - 574 mcg/dL (calc)   %SAT 23 20 - 48 % (calc)   Ferritin 29 24 - 380 ng/mL     Assessment & Plan Severe persistent asthma with acute exacerbation Acute exacerbation with increased wheezing and productive cough. No fever or chills. Prefers twice daily prednisone  dosing. - Prescribed prednisone  20 mg twice daily for 5 days without tapering. - Zpack due to productive cough  - Continue Trelegy as maintenance therapy. - Advised frequent use of albuterol , especially before bedtime. - Encouraged hydration and considered Mucinex for symptom relief.  Chronic obstructive pulmonary disease/Asthma  with acute exacerbation Exacerbation with increased dyspnea on exertion and nocturnal symptoms. No pneumonia. - Continue Trelegy as maintenance therapy. - Advised frequent use of albuterol , especially before bedtime. - Encouraged hydration and considered Mucinex for symptom relief.  Myasthenia gravis Increased fatigue and muscle weakness during illness. Mild ocular symptoms not severe enough for specialist consultation. - Advised to monitor symptoms and consult a specialist if myasthenia gravis worsens.  Anxiety disorder Managed with hydroxyzine , effective for nighttime anxiety. Pharmacy supply issue with 30-day instead of 90-day supply. - Advised to contact pharmacy to resolve issue with hydroxyzine  supply. - Continue hydroxyzine  as prescribed.            [1]  Current Outpatient Medications:    albuterol  (PROVENTIL ) (2.5 MG/3ML) 0.083% nebulizer solution, Take 3 mLs (2.5 mg total) by nebulization  every 6 (six) hours as needed for wheezing or shortness of breath., Disp: 75 mL, Rfl: 2   albuterol  (VENTOLIN  HFA) 108 (90 Base) MCG/ACT inhaler, Inhale 2 puffs into the lungs daily., Disp: , Rfl:    ammonium lactate  (AMLACTIN) 12 % lotion, Apply 1 Application topically as needed for dry skin., Disp: 400 g, Rfl: 3   aspirin  EC 81 MG tablet, Take 1 tablet (81 mg total) by mouth daily., Disp: 90 tablet, Rfl: 3   clopidogrel  (PLAVIX ) 75 MG  tablet, TAKE 1 TABLET BY MOUTH DAILY WITH BREAKFAST, Disp: 90 tablet, Rfl: 3   cyclobenzaprine  (FLEXERIL ) 10 MG tablet, Take 1 tablet (10 mg total) by mouth 2 (two) times daily., Disp: 60 tablet, Rfl: 3   DULoxetine  (CYMBALTA ) 30 MG capsule, Take 1 capsule (30 mg total) by mouth daily., Disp: 90 capsule, Rfl: 1   DULoxetine  (CYMBALTA ) 60 MG capsule, Take 1 capsule (60 mg total) by mouth daily., Disp: 90 capsule, Rfl: 1   ezetimibe  (ZETIA ) 10 MG tablet, TAKE 1 TABLET(10 MG) BY MOUTH DAILY, Disp: 90 tablet, Rfl: 2   furosemide  (LASIX ) 40 MG tablet, Take 1 tablet (40 mg total) by mouth daily., Disp: 90 tablet, Rfl: 3   gabapentin  (NEURONTIN ) 300 MG capsule, Take 1 capsule (300 mg total) by mouth 3 (three) times daily., Disp: 90 capsule, Rfl: 1   gabapentin  (NEURONTIN ) 800 MG tablet, Take 800 mg by mouth at bedtime., Disp: , Rfl:    HUMALOG  100 UNIT/ML injection, Inject 30-50 Units into the skin daily. Insulin  pump, Disp: , Rfl:    hydrOXYzine  (ATARAX ) 10 MG tablet, Take 1 tablet (10 mg total) by mouth daily as needed., Disp: 90 tablet, Rfl: 0   JARDIANCE  10 MG TABS tablet, TAKE 1 TABLET(10 MG) BY MOUTH DAILY BEFORE BREAKFAST, Disp: 90 tablet, Rfl: 3   ketoconazole  (NIZORAL ) 2 % cream, Apply 1 Application topically daily. For rash on face, Disp: 60 g, Rfl: 0   levocetirizine (XYZAL ) 5 MG tablet, Take 1 tablet (5 mg total) by mouth daily at 12 noon., Disp: 90 tablet, Rfl: 1   levothyroxine  (SYNTHROID ) 50 MCG tablet, TAKE 1 TABLET BY MOUTH EVERY DAY ON MONDAY TO SATURDAY  AND 2 TABLETS ON SUNDAY, Disp: 102 tablet, Rfl: 0   lubiprostone  (AMITIZA ) 8 MCG capsule, TAKE 1 CAPSULE(8 MCG) BY MOUTH TWICE DAILY WITH A MEAL, Disp: 180 capsule, Rfl: 1   metFORMIN  (GLUCOPHAGE ) 1000 MG tablet, Take 1,000 mg by mouth 2 (two) times daily with a meal., Disp: , Rfl:    montelukast  (SINGULAIR ) 10 MG tablet, Take 10 mg by mouth at bedtime., Disp: , Rfl:    Multiple Vitamins-Minerals (MENS MULTI VITAMIN & MINERAL PO), Take 1 tablet by mouth in the morning and at bedtime., Disp: , Rfl:    naloxone  (NARCAN ) nasal spray 4 mg/0.1 mL, For excess sedation from opioids, Disp: 1 kit, Rfl: 2   nitroGLYCERIN  (NITROSTAT ) 0.3 MG SL tablet, DISSOLVE 1 TABLET UNDER THE TONGUE EVERY 5 MINUTES AS NEEDED FOR CHEST PAIN, MAX 3 DOSES, Disp: 25 tablet, Rfl: 0   Oxycodone  HCl 10 MG TABS, Take 1 tablet (10 mg total) by mouth in the morning, at noon, in the evening, and at bedtime., Disp: 120 tablet, Rfl: 0   QUEtiapine  (SEROQUEL ) 25 MG tablet, Take 1 tablet (25 mg total) by mouth at bedtime., Disp: 90 tablet, Rfl: 0   rosuvastatin  (CRESTOR ) 5 MG tablet, Take 5 mg by mouth daily with supper., Disp: , Rfl: 0   Semaglutide, 2 MG/DOSE, 8 MG/3ML SOPN, Inject into the skin., Disp: , Rfl:    spironolactone  (ALDACTONE ) 25 MG tablet, Take 0.5 tablets (12.5 mg total) by mouth daily., Disp: 45 tablet, Rfl: 3   triamcinolone  cream (KENALOG ) 0.1 %, Apply 1 Application topically 2 (two) times daily as needed. For rash on arms/legs, Disp: 453.6 g, Rfl: 0  Current Facility-Administered Medications:    sodium chloride  flush (NS) 0.9 % injection 3 mL, 3 mL, Intravenous, Q12H, Furth, Cadence H, PA-C [2]  Allergies Allergen  Reactions   Azathioprine Other (See Comments)    Azathioprine hypersensitivity reaction - Symptoms mimicking sepsis - was hospitalized   Novolog  [Insulin  Aspart (Human Analog) (Yeast)] Hives

## 2024-10-12 ENCOUNTER — Encounter: Payer: Self-pay | Admitting: Nurse Practitioner

## 2024-10-12 ENCOUNTER — Ambulatory Visit: Attending: Nurse Practitioner | Admitting: Nurse Practitioner

## 2024-10-12 VITALS — BP 92/62 | HR 98 | Ht 67.0 in | Wt 214.5 lb

## 2024-10-12 DIAGNOSIS — N182 Chronic kidney disease, stage 2 (mild): Secondary | ICD-10-CM | POA: Diagnosis not present

## 2024-10-12 DIAGNOSIS — I255 Ischemic cardiomyopathy: Secondary | ICD-10-CM

## 2024-10-12 DIAGNOSIS — I5020 Unspecified systolic (congestive) heart failure: Secondary | ICD-10-CM | POA: Diagnosis not present

## 2024-10-12 DIAGNOSIS — Z79899 Other long term (current) drug therapy: Secondary | ICD-10-CM

## 2024-10-12 DIAGNOSIS — I10 Essential (primary) hypertension: Secondary | ICD-10-CM

## 2024-10-12 DIAGNOSIS — I951 Orthostatic hypotension: Secondary | ICD-10-CM

## 2024-10-12 DIAGNOSIS — E1142 Type 2 diabetes mellitus with diabetic polyneuropathy: Secondary | ICD-10-CM

## 2024-10-12 DIAGNOSIS — Z794 Long term (current) use of insulin: Secondary | ICD-10-CM

## 2024-10-12 DIAGNOSIS — I2510 Atherosclerotic heart disease of native coronary artery without angina pectoris: Secondary | ICD-10-CM | POA: Diagnosis not present

## 2024-10-12 DIAGNOSIS — E782 Mixed hyperlipidemia: Secondary | ICD-10-CM

## 2024-10-12 MED ORDER — FUROSEMIDE 20 MG PO TABS: 20.0000 mg | ORAL_TABLET | Freq: Every day | ORAL | 3 refills | Status: DC

## 2024-10-12 NOTE — Patient Instructions (Signed)
 Medication Instructions:  Your physician recommends the following medication changes.  DECREASE: Lasix  20 mg daily   *If you need a refill on your cardiac medications before your next appointment, please call your pharmacy*  Lab Work: Your provider would like for you to have following labs drawn today CMeT, CBC, Lipids and Direct LDL.   If you have labs (blood work) drawn today and your tests are completely normal, you will receive your results only by: MyChart Message (if you have MyChart) OR A paper copy in the mail If you have any lab test that is abnormal or we need to change your treatment, we will call you to review the results.  Follow-Up: At [facility], you and your health needs are our priority.  As part of our continuing mission to provide you with exceptional heart care, our providers are all part of one team.  This team includes your primary Cardiologist (physician) and Advanced Practice Providers or APPs (Physician Assistants and Nurse Practitioners) who all work together to provide you with the care you need, when you need it.  Your next appointment:   3 month(s)  Provider:   You may see Deatrice Cage, MD or Lonni Meager, NP

## 2024-10-12 NOTE — Progress Notes (Signed)
 "    Office Visit    Patient Name: [name] Date of Encounter: 10/12/2024  Primary Care Provider:  Sowles, Krichna, MD Primary Cardiologist:  Deatrice Cage, MD    Chief Complaint    64 y.o. male w/ a h/o CAD s/p multiple PCI's, ischemic cardiomyopathy, HFrEF, hypertension, orthostatic hypotension, hyperlipidemia, type 2 diabetes mellitus, diabetic neuropathy, myasthenia gravis, sepsis, and obstructive sleep apnea, who presents for f/u related to orthostasis.  Past Medical History   Subjective   Past Medical History:  Diagnosis Date   Allergy    dust, seasonal (worse in the fall).   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 Followed by Pain Specialist in [place], back and neck   Asthma    BRONCHITIS   Cataract    First Dx in 2012   Chronic combined systolic and diastolic congestive heart failure (HCC)    a. 03/2018 Echo: EF 30-35%; b. 07/2018 Echo: EF 35-40%; c. 09/2019 TEE: EF 40-45%; d. 12/2020 Echo: EF 35-40%; e.01/2022  Echo: EF 45-50%. sev apical HK; f. 01/2023 Echo: EF 40-45%, mod asymm basal-septal LVH, apical AK, nl RV fxn.   CKD (chronic kidney disease), stage II    Coronary artery disease    a. Prior Ant MI->s/p mult stents->LAD/RCA ([place]); b. 2016 Cath: nonobs dzs;  c. 04/2018 Cath/PCI: LCX 20m (3.25x15 Sierra DES); d. 02/2022 MV: high risk; e. 02/2022 Cath: LM nl, LAD 20p ISR, patent mid-stent, LCX patent stent, OM1 nl, OM2 50p, patent stent, OM3 40p, patent stent, RCA 40p ISR, 77m ISR, 25m/d, patent distal stent, RPDA patent stent, RPAV small, 80 (sl progression)-->Med Rx.   Deaf, left    Diabetes mellitus without complication (HCC)    TYPE 2   Diabetic peripheral neuropathy (HCC)    feet and hands   FUO (fever of unknown origin) 08/03/2018   GERD (gastroesophageal reflux disease)    Headache    muscle tension   Hyperlipidemia    Hypertension    Hyperthyroidism    Insomnia    Ischemic cardiomyopathy    a. 03/2018 Echo: EF 30-35%; b. 07/2018 Echo: EF 35-40%, Gr1 DD;  c. 09/2019 TEE: EF 40-45%; d. 12/2020 Echo: EF 35-40%. GrI DD; e. 01/2022 Echo: EF 45-50%; f. 01/2023 Echo: EF 40-45%.   Knee pain, acute 05/06/2020   Left arm weakness 10/04/2019   Left leg weakness 12/01/2019   Lyme disease    Chronic   Myasthenia gravis (HCC)    (03/18/21 - no current treatment - better than it has ever been per pt)   Myocardial infarction (HCC) 2010   Palpitations    a. 10/2020 Zio: RSR, 88 avg. 3 brief SVT episodes (max 5 beats @ 128). Rare PACs/PVCs. Triggered events did not correlate w/ significant arrthymia - some w/ sinus tach.   Seasonal allergies    Sepsis (HCC)    a.07/2018 - unknown source. TEE neg for veg 09/2019.   Sleep apnea    CPAP   Wears hearing aid in both ears    Past Surgical History:  Procedure Laterality Date   BILATERAL CARPAL TUNNEL RELEASE Bilateral L in 2012 and R in 2013   CARDIAC CATHETERIZATION     Several Caths, most recent in  March 2016.   COLONOSCOPY WITH PROPOFOL  N/A 01/10/2016   Procedure: COLONOSCOPY WITH PROPOFOL ;  Surgeon: Rogelia Copping, MD;  Location: [facility] END[facility];  Service: Endoscopy;  Laterality: N/A;   COLONOSCOPY WITH PROPOFOL  N/A 04/14/2021   Procedure: COLONOSCOPY WITH  PROPOFOL ;  Surgeon: Jinny Carmine, MD;  Location: [facility];  Service: Endoscopy;  Laterality: N/A;  Diabetic - insulin  and oral meds   CORONARY ANGIOPLASTY     CORONARY STENT INTERVENTION N/A 04/25/2018   Procedure: CORONARY STENT INTERVENTION;  Surgeon: Darron Deatrice LABOR, MD;  Location: [facility];  Service: Cardiovascular;  Laterality: N/A;   ESOPHAGOGASTRODUODENOSCOPY (EGD) WITH PROPOFOL  N/A 01/10/2016   Procedure: ESOPHAGOGASTRODUODENOSCOPY (EGD) WITH PROPOFOL ;  Surgeon: Carmine Jinny, MD;  Location: [facility] END[facility];  Service: Endoscopy;  Laterality: N/A;   ESOPHAGOGASTRODUODENOSCOPY (EGD) WITH PROPOFOL  N/A 04/14/2021   Procedure: ESOPHAGOGASTRODUODENOSCOPY (EGD) WITH BIOPSY;  Surgeon: Jinny Carmine, MD;  Location: [facility];  Service:  Endoscopy;  Laterality: N/A;   EYE SURGERY Bilateral 2012   cataract/bilateral vitrectomies   GIVENS CAPSULE STUDY N/A 08/20/2021   Procedure: GIVENS CAPSULE STUDY;  Surgeon: Jinny Carmine, MD;  Location: [facility] ENDOSCOPY;  Service: Endoscopy;  Laterality: N/A;   LEFT HEART CATH AND CORONARY ANGIOGRAPHY Left 04/25/2018   Procedure: LEFT HEART CATH AND CORONARY ANGIOGRAPHY;  Surgeon: Darron Deatrice LABOR, MD;  Location: [facility];  Service: Cardiovascular;  Laterality: Left;   LEFT HEART CATH AND CORONARY ANGIOGRAPHY Left 02/02/2022   Procedure: LEFT HEART CATH AND CORONARY ANGIOGRAPHY;  Surgeon: Darron Deatrice LABOR, MD;  Location: [facility];  Service: Cardiovascular;  Laterality: Left;   TEE WITHOUT CARDIOVERSION N/A 09/05/2018   Procedure: TRANSESOPHAGEAL ECHOCARDIOGRAM (TEE);  Surgeon: Darron Deatrice LABOR, MD;  Location: [facility] OR[facility];  Service: Cardiovascular;  Laterality: N/A;   TONSILLECTOMY AND ADENOIDECTOMY     As a child   TUNNELED VENOUS CATHETER PLACEMENT     removed    Allergies  Allergies[1]     History of Present Illness      64 y.o. y/o male with above complex past medical history including CAD status post multiple PCI's, ischemic cardiomyopathy, HFrEF, hypertension, hyperlipidemia, type 2 diabetes mellitus, diabetic neuropathy, myasthenia gravis, sepsis, and obstructive sleep apnea.  He initially suffered a myocardial infarction in 2009 while living in [place] .  He has had approximately 10 stents between the LAD artery over time.  In June 2019, he had worsening dyspnea on exertion and chest pain.  Echo showed an EF of 30 to 35%.  Cath showed a new 90% stenosis in the mid left circumflex with multiple patent LAD, RCA, and OM2 stents.  The circumflex was successfully treated with a drug-eluting stent.  In the fall 2019, he was admitted with sepsis with subsequent TEE showing an EF of 40 to 45% without vegetation.  He had a syncopal episode in October 2021 in the setting of  coughing with subsequent monitoring in January 2022 showing no significant arrhythmias.  In the setting of significant orthostasis, he has undergone significant scaling back of his medications.  He did not tolerate Entresto , and beta-blocker therapy ended up being discontinued due to ongoing severe orthostasis and presyncope despite downward titration.  In April 2023, he presented with chest pain and underwent stress testing which showed partially reversible defect in the apical to mid inferior, inferolateral, and apical to mid anterior locations.  Echo showed improved EF @ 45-50%.  He subsequently underwent diagnostic catheterization in May 2023 which showed predominantly moderate, nonobstructive disease with minimal in-stent restenosis.  He had some progression of the small vessel RPAV (80% stenosis), medical therapy was recommended.   In early 2024, Mr. Demir c/o DOE and was noted to be mildly volume overloaded.  He responded well to titration  of lasix  therapy to 40 mg daily.  Metoprolol  was added back to his therapy in 11/2022 due to ongoing sinus tachycardia.  Follow-up echo in April 2024 showed an EF of 40 to 45%.  He subsequently noted worsening orthostasis and syncope, requiring discontinuation of metoprolol  and downward titration of diuretic therapy in November 2024, with significant improvement in orthostatic symptoms.     Mr. Craney was last seen in cardiology clinic in April 2025 at which time he was doing well.  He notes that over the past 8 months or so, he has continued to experience intermittent positional lightheadedness but has not suffered any falls or syncope.  He says that overall he is been feeling exceptionally well without chest pain or dyspnea but recently has been prescribed antibiotics and steroids due to bronchitis and cough.  He denies palpitations, PND, orthopnea, dizziness, syncope, edema, or early satiety today.  Blood pressure was low on arrival with evidence of orthostasis  immediately upon standing. Objective   Home Medications    Current Outpatient Medications  Medication Sig Dispense Refill   albuterol  (PROVENTIL ) (2.5 MG/3ML) 0.083% nebulizer solution Take 3 mLs (2.5 mg total) by nebulization every 6 (six) hours as needed for wheezing or shortness of breath. 75 mL 2   albuterol  (VENTOLIN  HFA) 108 (90 Base) MCG/ACT inhaler Inhale 2 puffs into the lungs daily.     ammonium lactate  (AMLACTIN) 12 % lotion Apply 1 Application topically as needed for dry skin. 400 g 3   aspirin  EC 81 MG tablet Take 1 tablet (81 mg total) by mouth daily. 90 tablet 3   azithromycin  (ZITHROMAX ) 250 MG tablet Take 2 tablets on day 1, then 1 tablet daily on days 2 through 5 6 tablet 0   clopidogrel  (PLAVIX ) 75 MG tablet TAKE 1 TABLET BY MOUTH DAILY WITH BREAKFAST 90 tablet 3   cyclobenzaprine  (FLEXERIL ) 10 MG tablet Take 1 tablet (10 mg total) by mouth 2 (two) times daily. 60 tablet 3   DULoxetine  (CYMBALTA ) 30 MG capsule Take 1 capsule (30 mg total) by mouth daily. 90 capsule 1   DULoxetine  (CYMBALTA ) 60 MG capsule Take 1 capsule (60 mg total) by mouth daily. 90 capsule 1   ezetimibe  (ZETIA ) 10 MG tablet TAKE 1 TABLET(10 MG) BY MOUTH DAILY 90 tablet 2   Fluticasone -Umeclidin-Vilant (TRELEGY ELLIPTA ) 200-62.5-25 MCG/ACT AEPB Inhale 1 puff into the lungs daily.     furosemide  (LASIX ) 40 MG tablet Take 1 tablet (40 mg total) by mouth daily. 90 tablet 3   gabapentin  (NEURONTIN ) 300 MG capsule Take 1 capsule (300 mg total) by mouth 3 (three) times daily. 90 capsule 1   gabapentin  (NEURONTIN ) 800 MG tablet Take 800 mg by mouth at bedtime.     HUMALOG  100 UNIT/ML injection Inject 30-50 Units into the skin daily. Insulin  pump     hydrOXYzine  (ATARAX ) 10 MG tablet Take 1 tablet (10 mg total) by mouth daily as needed. 90 tablet 0   JARDIANCE  10 MG TABS tablet TAKE 1 TABLET(10 MG) BY MOUTH DAILY BEFORE BREAKFAST 90 tablet 3   ketoconazole  (NIZORAL ) 2 % cream Apply 1 Application topically daily.  For rash on face 60 g 0   levocetirizine (XYZAL ) 5 MG tablet Take 1 tablet (5 mg total) by mouth daily at 12 noon. 90 tablet 1   levothyroxine  (SYNTHROID ) 50 MCG tablet TAKE 1 TABLET BY MOUTH EVERY DAY ON MONDAY TO SATURDAY AND 2 TABLETS ON SUNDAY 102 tablet 0   lubiprostone  (AMITIZA ) 8 MCG capsule  TAKE 1 CAPSULE(8 MCG) BY MOUTH TWICE DAILY WITH A MEAL 180 capsule 1   metFORMIN  (GLUCOPHAGE ) 1000 MG tablet Take 1,000 mg by mouth 2 (two) times daily with a meal.     montelukast  (SINGULAIR ) 10 MG tablet Take 10 mg by mouth at bedtime.     Multiple Vitamins-Minerals (MENS MULTI VITAMIN & MINERAL PO) Take 1 tablet by mouth in the morning and at bedtime.     naloxone  (NARCAN ) nasal spray 4 mg/0.1 mL For excess sedation from opioids 1 kit 2   nitroGLYCERIN  (NITROSTAT ) 0.3 MG SL tablet DISSOLVE 1 TABLET UNDER THE TONGUE EVERY 5 MINUTES AS NEEDED FOR CHEST PAIN, MAX 3 DOSES 25 tablet 0   Oxycodone  HCl 10 MG TABS Take 1 tablet (10 mg total) by mouth in the morning, at noon, in the evening, and at bedtime. 120 tablet 0   predniSONE  (DELTASONE ) 20 MG tablet Take 1 tablet (20 mg total) by mouth 2 (two) times daily with a meal. 10 tablet 0   QUEtiapine  (SEROQUEL ) 25 MG tablet Take 1 tablet (25 mg total) by mouth at bedtime. 90 tablet 0   rosuvastatin  (CRESTOR ) 5 MG tablet Take 5 mg by mouth daily with supper.  0   Semaglutide, 2 MG/DOSE, 8 MG/3ML SOPN Inject into the skin.     spironolactone  (ALDACTONE ) 25 MG tablet Take 0.5 tablets (12.5 mg total) by mouth daily. 45 tablet 3   triamcinolone  cream (KENALOG ) 0.1 % Apply 1 Application topically 2 (two) times daily as needed. For rash on arms/legs 453.6 g 0   Current Facility-Administered Medications  Medication Dose Route Frequency Provider Last Rate Last Admin   sodium chloride  flush (NS) 0.9 % injection 3 mL  3 mL Intravenous Q12H Furth, Cadence H, PA-C         Physical Exam    VS:  BP 92/62 (BP Location: Left Arm, Patient Position: Sitting, Cuff Size:  Normal)   Pulse 98   Ht 5' 7 (1.702 m)   Wt 214 lb 8 oz (97.3 kg)   SpO2 97%   BMI 33.60 kg/m  , BMI Body mass index is 33.6 kg/m. Orthostatic VS for the past 24 hrs (Last 3 readings):  BP- Lying Pulse- Lying BP- Sitting Pulse- Sitting BP- Standing at 0 minutes Pulse- Standing at 0 minutes BP- Standing at 3 minutes Pulse- Standing at 3 minutes  10/12/24 0912 (!) 87/55 98 (!) 87/62 97 (!) 58/40 101 (!) 64/40 102          GEN: Well nourished, well developed, in no acute distress. HEENT: normal. Neck: Supple, no JVD, carotid bruits, or masses. Cardiac: RRR, no murmurs, rubs, or gallops. No clubbing, cyanosis, edema.  Radials 2+/PT 2+ and equal bilaterally.  Respiratory:  Respirations regular and unlabored, faint expiratory wheezing. GI: Soft, nontender, nondistended, BS + x 4. MS: no deformity or atrophy. Skin: warm and dry, no rash. Neuro:  Strength and sensation are intact. Psych: Normal affect.  Accessory Clinical Findings    ECG personally reviewed by me today - EKG Interpretation Date/Time:  Thursday October 12 2024 09:10:40 EST Ventricular Rate:  98 PR Interval:  160 QRS Duration:  134 QT Interval:  374 QTC Calculation: 477 R Axis:   109  Text Interpretation: Normal sinus rhythm  Right bundle branch block Inferior infarct (cited on or before 18-Jul-2020) Anterolateral infarct (cited on or before 18-Jul-2020)  Confirmed by Vivienne Bruckner ([id number]) on 10/12/2024 9:33:07 AM  - no acute changes.  Lab Results  Component  Value Date   WBC 7.3 08/24/2024   HGB 14.2 08/24/2024   HCT 44.3 08/24/2024   MCV 91.3 08/24/2024   PLT 209 08/24/2024   Lab Results  Component Value Date   CREATININE 1.41 (H) 04/27/2024   BUN 21 04/27/2024   NA 140 04/27/2024   K 4.6 04/27/2024   CL 101 04/27/2024   CO2 32 04/27/2024   Lab Results  Component Value Date   ALT 15 04/27/2024   AST 18 04/27/2024   ALKPHOS 66 06/24/2022   BILITOT 0.4 04/27/2024   Lab Results  Component  Value Date   CHOL 113 12/02/2023   HDL 45 12/02/2023   LDLCALC 46 12/02/2023   LDLDIRECT 36 07/17/2021   TRIG 140 12/02/2023   CHOLHDL 2.5 12/02/2023    Lab Results  Component Value Date   HGBA1C 7.0 03/06/2024   Lab Results  Component Value Date   TSH 2.23 08/24/2024       Assessment & Plan    1.  Orthostatic hypotension: Patient with a several year history of orthostasis which has resulted in discontinuation of most of previously prescribed GDMT and reduction in diuretic dosing.  Last year we reduced his Lasix  to 20 mg daily though today he says that he has been taking 40.  In that setting, he has continued to have intermittent orthostatic lightheadedness.  On presentation today, his blood pressure was low, prompting us  to check orthostatics, with a recorded drop in blood pressure to 58/40 with a heart rate of 101 upon standing.  Repeat blood pressure at rest is 92/62 and he is asymptomatic.  I am checking a CBC and basic metabolic panel today and have asked him to reduce his Lasix  back down to 20 mg daily with further recommendations pending lab work.  We may need to reconsider his Jardiance  given its ability to worsen orthostasis.  Interestingly, he does not feel any different today as he has over the past several months and in general has been feeling well.  2.  Coronary artery disease: Most recent diagnostic catheterization May 2023 showed patent stents with minimal in-stent restenosis and some progression of small vessel disease involving the RPAV.  He has not been having chest pain and notes that he has been feeling quite well.  He remains on aspirin , Plavix , statin, and Zetia .  No longer on beta-blocker in the setting of history of orthostasis.  3.  Chronic heart failure midrange ejection fraction/ischemic cardiomyopathy: EF 40-45% by echo in April 2024.  Euvolemic on exam with stable weights.  Orthostatic is likely dry.  Follow-up basic metabolic panel today with plan to reduce  Lasix  back to 20 mg daily or potentially further pending lab work.  He remains on Jardiance  which we may need to reconsider in light of orthostatic symptoms/pressures.  4.  Stage II-III chronic kidney disease: Creatinine was 1.41 in July.  In light of orthostasis, following up labs today.  5.  Hyperlipidemia: LDL was 46 last February.  Will follow-up lipids and LFTs today.  6.  Type 2 diabetes mellitus: A1c 7.0 in June 2025.  He is followed closely by primary care and remains on Humalog , metformin , semaglutide, and Jardiance .  7.  COPD/asthma/acute bronchitis: Followed by pulmonology.  Recent prescribed antibiotics and steroids due to bronchitis.  He plans to pick these up from the pharmacy today.  8.  Disposition: Follow-up CBC, basic metabolic panel, lipids and LFTs.  Follow-up in clinic in 3 months or sooner if necessary.  Lonni Meager,  NP 10/12/2024, 9:50 AM     [1]  Allergies Allergen Reactions   Azathioprine Other (See Comments)    Azathioprine hypersensitivity reaction - Symptoms mimicking sepsis - was hospitalized   Novolog  [Insulin  Aspart (Human Analog) (Yeast)] Hives   "

## 2024-10-13 ENCOUNTER — Ambulatory Visit: Payer: Self-pay | Admitting: Nurse Practitioner

## 2024-10-13 ENCOUNTER — Telehealth: Payer: Self-pay | Admitting: Nurse Practitioner

## 2024-10-13 LAB — COMPREHENSIVE METABOLIC PANEL WITH GFR
ALT: 21 IU/L (ref 0–44)
AST: 25 IU/L (ref 0–40)
Albumin: 4.1 g/dL (ref 3.9–4.9)
Alkaline Phosphatase: 114 IU/L (ref 47–123)
BUN/Creatinine Ratio: 17 (ref 10–24)
BUN: 26 mg/dL (ref 8–27)
Bilirubin Total: 0.5 mg/dL (ref 0.0–1.2)
CO2: 27 mmol/L (ref 20–29)
Calcium: 9.8 mg/dL (ref 8.6–10.2)
Chloride: 98 mmol/L (ref 96–106)
Creatinine, Ser: 1.5 mg/dL — ABNORMAL HIGH (ref 0.76–1.27)
Globulin, Total: 1.9 g/dL (ref 1.5–4.5)
Glucose: 221 mg/dL — ABNORMAL HIGH (ref 70–99)
Potassium: 4.4 mmol/L (ref 3.5–5.2)
Sodium: 140 mmol/L (ref 134–144)
Total Protein: 6 g/dL (ref 6.0–8.5)
eGFR: 51 mL/min/1.73 — ABNORMAL LOW

## 2024-10-13 LAB — LIPID PANEL
Chol/HDL Ratio: 3.6 ratio (ref 0.0–5.0)
Cholesterol, Total: 145 mg/dL (ref 100–199)
HDL: 40 mg/dL
LDL Chol Calc (NIH): 76 mg/dL (ref 0–99)
Triglycerides: 169 mg/dL — ABNORMAL HIGH (ref 0–149)
VLDL Cholesterol Cal: 29 mg/dL (ref 5–40)

## 2024-10-13 LAB — CBC
Hematocrit: 45 % (ref 37.5–51.0)
Hemoglobin: 14.2 g/dL (ref 13.0–17.7)
MCH: 29.8 pg (ref 26.6–33.0)
MCHC: 31.6 g/dL (ref 31.5–35.7)
MCV: 95 fL (ref 79–97)
Platelets: 231 x10E3/uL (ref 150–450)
RBC: 4.76 x10E6/uL (ref 4.14–5.80)
RDW: 13.7 % (ref 11.6–15.4)
WBC: 7.1 x10E3/uL (ref 3.4–10.8)

## 2024-10-13 LAB — LDL CHOLESTEROL, DIRECT: LDL Direct: 78 mg/dL (ref 0–99)

## 2024-10-13 NOTE — Telephone Encounter (Signed)
 Pt was returning call regarding results and is requesting a callback. Please advise

## 2024-10-13 NOTE — Telephone Encounter (Signed)
 Called and spoke with the patient.  Informed him of the latest lab results as interpreted by Medford Meager, NP:  Blood counts are normal. Electrolytes and liver enzymes are normal. Kidney function is slightly more abnormal than typical baseline with creatinine of 1.5 - perhaps some dehydration related to lasix  dosing, which could explain drop in blood pressures with position changes.  Continue with plan as described at visit to reduce lasix  to 20mg  once daily. Triglycerides are mildly elevated however, this wasn't a fasting sample.   Direct LDL cholesterol is elevated at 78, which is higher than last year, when it was 46.  He appears to be on a stable dose of rosuvastatin  and zetia  over several years - please ensure that nothing has changed w/ compliance on his end.  With intolerance to higher dose statins, assuming compliance, I strongly recommend reducing intake of saturated fats and cholesterol (meats/dairy) with greater focus on a whole food, plant based diet.  We could consider an injectable agent though knowing that he was capable of lower LDLs historically, I'd like to try more conservative measures first.  Inquired if the patient was compliant with Zetia  and Rosuvastatin  as prescribed.  Patient confirms that he is.  Advised the patient to reduce intake of saturated fats and cholesterol.  Patient verbalized agreement with therapeutic recommendation and will discuss further treatment options at next visit.  All questions and concerns addressed at this time.  Patient expressed gratitude for the call.

## 2024-10-15 ENCOUNTER — Other Ambulatory Visit: Payer: Self-pay | Admitting: Anesthesiology

## 2024-10-16 ENCOUNTER — Encounter: Payer: Self-pay | Admitting: Anesthesiology

## 2024-10-16 ENCOUNTER — Ambulatory Visit: Attending: Anesthesiology | Admitting: Anesthesiology

## 2024-10-16 VITALS — BP 137/87 | HR 98 | Temp 97.5°F | Resp 16 | Ht 67.0 in | Wt 213.0 lb

## 2024-10-16 DIAGNOSIS — M5432 Sciatica, left side: Secondary | ICD-10-CM

## 2024-10-16 DIAGNOSIS — M5442 Lumbago with sciatica, left side: Secondary | ICD-10-CM

## 2024-10-16 DIAGNOSIS — M5136 Other intervertebral disc degeneration, lumbar region with discogenic back pain only: Secondary | ICD-10-CM

## 2024-10-16 DIAGNOSIS — M4716 Other spondylosis with myelopathy, lumbar region: Secondary | ICD-10-CM | POA: Insufficient documentation

## 2024-10-16 DIAGNOSIS — M542 Cervicalgia: Secondary | ICD-10-CM | POA: Diagnosis not present

## 2024-10-16 DIAGNOSIS — F1190 Opioid use, unspecified, uncomplicated: Secondary | ICD-10-CM | POA: Diagnosis present

## 2024-10-16 DIAGNOSIS — M545 Low back pain, unspecified: Secondary | ICD-10-CM

## 2024-10-16 DIAGNOSIS — Z79891 Long term (current) use of opiate analgesic: Secondary | ICD-10-CM | POA: Diagnosis not present

## 2024-10-16 DIAGNOSIS — R29898 Other symptoms and signs involving the musculoskeletal system: Secondary | ICD-10-CM

## 2024-10-16 DIAGNOSIS — G894 Chronic pain syndrome: Secondary | ICD-10-CM | POA: Diagnosis not present

## 2024-10-16 MED ORDER — OXYCODONE HCL 10 MG PO TABS: 10.0000 mg | ORAL_TABLET | Freq: Four times a day (QID) | ORAL | 0 refills | Status: DC

## 2024-10-16 MED ORDER — CYCLOBENZAPRINE HCL 10 MG PO TABS: 10.0000 mg | ORAL_TABLET | Freq: Two times a day (BID) | ORAL | 3 refills | Status: AC

## 2024-10-16 MED ORDER — OXYCODONE HCL 10 MG PO TABS: 10.0000 mg | ORAL_TABLET | Freq: Four times a day (QID) | ORAL | 0 refills | Status: AC

## 2024-10-16 NOTE — Progress Notes (Signed)
 "  Subjective:  Patient ID: [name], male    DOB: 1961/04/14  Age: 64 y.o. MRN: [id number]  CC: Back Pain (Lower back left center is worse ) and Shoulder Pain (Right )   Procedure: None  HPI [name] presents for reevaluation.  He was last seen a few months ago and has been doing well.  He reports that he is having some recurrence of his same baseline sciatica which has been something of a challenge over the past several years.  His last epidural was back in March 2025 and this kept his pain under good control for about 4 to 6 months.  Over the course of the last several weeks she has had recurrence of this left lower extremity sciatica symptoms to the point where it is beginning to keep him up at night and limit his activity.  Unfortunately he cannot find any other means to remedy the pain other than his chronic opioid therapy but this has been insufficient as of late.  He generally gets good relief rated at about 75% improvement lasting greater than 2 to 4 months following epidural steroid therapy.  No other changes in lower extremity strength function bowel or bladder function is noted at this time.  No side effects with his chronic opioid therapy are reported.  Outpatient Medications Prior to Visit  Medication Sig Dispense Refill   albuterol  (PROVENTIL ) (2.5 MG/3ML) 0.083% nebulizer solution Take 3 mLs (2.5 mg total) by nebulization every 6 (six) hours as needed for wheezing or shortness of breath. 75 mL 2   albuterol  (VENTOLIN  HFA) 108 (90 Base) MCG/ACT inhaler Inhale 2 puffs into the lungs daily.     ammonium lactate  (AMLACTIN) 12 % lotion Apply 1 Application topically as needed for dry skin. 400 g 3   aspirin  EC 81 MG tablet Take 1 tablet (81 mg total) by mouth daily. 90 tablet 3   azithromycin  (ZITHROMAX ) 250 MG tablet Take 2 tablets on day 1, then 1 tablet daily on days 2 through 5 6 tablet 0   clopidogrel  (PLAVIX ) 75 MG tablet TAKE 1 TABLET BY MOUTH DAILY WITH BREAKFAST 90  tablet 3   DULoxetine  (CYMBALTA ) 30 MG capsule Take 1 capsule (30 mg total) by mouth daily. 90 capsule 1   DULoxetine  (CYMBALTA ) 60 MG capsule Take 1 capsule (60 mg total) by mouth daily. 90 capsule 1   ezetimibe  (ZETIA ) 10 MG tablet TAKE 1 TABLET(10 MG) BY MOUTH DAILY 90 tablet 2   Fluticasone -Umeclidin-Vilant (TRELEGY ELLIPTA ) 200-62.5-25 MCG/ACT AEPB Inhale 1 puff into the lungs daily.     furosemide  (LASIX ) 20 MG tablet Take 1 tablet (20 mg total) by mouth daily. 90 tablet 3   gabapentin  (NEURONTIN ) 300 MG capsule Take 1 capsule (300 mg total) by mouth 3 (three) times daily. 90 capsule 1   gabapentin  (NEURONTIN ) 800 MG tablet Take 800 mg by mouth at bedtime.     HUMALOG  100 UNIT/ML injection Inject 30-50 Units into the skin daily. Insulin  pump     hydrOXYzine  (ATARAX ) 10 MG tablet Take 1 tablet (10 mg total) by mouth daily as needed. 90 tablet 0   JARDIANCE  10 MG TABS tablet TAKE 1 TABLET(10 MG) BY MOUTH DAILY BEFORE BREAKFAST 90 tablet 3   ketoconazole  (NIZORAL ) 2 % cream Apply 1 Application topically daily. For rash on face 60 g 0   levocetirizine (XYZAL ) 5 MG tablet Take 1 tablet (5 mg total) by mouth daily at 12 noon. 90 tablet 1  levothyroxine  (SYNTHROID ) 50 MCG tablet TAKE 1 TABLET BY MOUTH EVERY DAY ON MONDAY TO SATURDAY AND 2 TABLETS ON SUNDAY 102 tablet 0   lubiprostone  (AMITIZA ) 8 MCG capsule TAKE 1 CAPSULE(8 MCG) BY MOUTH TWICE DAILY WITH A MEAL 180 capsule 1   metFORMIN  (GLUCOPHAGE ) 1000 MG tablet Take 1,000 mg by mouth 2 (two) times daily with a meal.     montelukast  (SINGULAIR ) 10 MG tablet Take 10 mg by mouth at bedtime.     Multiple Vitamins-Minerals (MENS MULTI VITAMIN & MINERAL PO) Take 1 tablet by mouth in the morning and at bedtime.     naloxone  (NARCAN ) nasal spray 4 mg/0.1 mL For excess sedation from opioids 1 kit 2   nitroGLYCERIN  (NITROSTAT ) 0.3 MG SL tablet DISSOLVE 1 TABLET UNDER THE TONGUE EVERY 5 MINUTES AS NEEDED FOR CHEST PAIN, MAX 3 DOSES 25 tablet 0    predniSONE  (DELTASONE ) 20 MG tablet Take 1 tablet (20 mg total) by mouth 2 (two) times daily with a meal. 10 tablet 0   QUEtiapine  (SEROQUEL ) 25 MG tablet Take 1 tablet (25 mg total) by mouth at bedtime. 90 tablet 0   rosuvastatin  (CRESTOR ) 5 MG tablet Take 5 mg by mouth daily with supper.  0   Semaglutide, 2 MG/DOSE, 8 MG/3ML SOPN Inject into the skin.     spironolactone  (ALDACTONE ) 25 MG tablet Take 0.5 tablets (12.5 mg total) by mouth daily. 45 tablet 3   triamcinolone  cream (KENALOG ) 0.1 % Apply 1 Application topically 2 (two) times daily as needed. For rash on arms/legs 453.6 g 0   cyclobenzaprine  (FLEXERIL ) 10 MG tablet Take 1 tablet (10 mg total) by mouth 2 (two) times daily. 60 tablet 3   Oxycodone  HCl 10 MG TABS Take 1 tablet (10 mg total) by mouth in the morning, at noon, in the evening, and at bedtime. 120 tablet 0   Facility-Administered Medications Prior to Visit  Medication Dose Route Frequency Provider Last Rate Last Admin   sodium chloride  flush (NS) 0.9 % injection 3 mL  3 mL Intravenous Q12H Furth, Cadence H, PA-C        Review of Systems CNS: No confusion or sedation Cardiac: No angina or palpitations GI: No abdominal pain or constipation Constitutional: No nausea vomiting fevers or chills  Objective:  BP 137/87 (BP Location: Right Arm, Patient Position: Sitting, Cuff Size: Large)   Pulse 98   Temp (!) 97.5 F (36.4 C) (Temporal)   Resp 16   Ht 5' 7 (1.702 m)   Wt 213 lb (96.6 kg)   SpO2 99%   BMI 33.36 kg/m    BP Readings from Last 3 Encounters:  10/16/24 137/87  10/12/24 92/62  10/11/24 110/68     Wt Readings from Last 3 Encounters:  10/16/24 213 lb (96.6 kg)  10/12/24 214 lb 8 oz (97.3 kg)  10/11/24 213 lb 12.8 oz (97 kg)     Physical Exam Pt is alert and oriented PERRL EOMI HEART IS RRR no murmur or rub LCTA no wheezing or rales MUSCULOSKELETAL reveals some paraspinous muscle tenderness but no overt trigger points in the lumbar region.  He  has chronic ongoing left lower extremity weakness comparable to what his baseline is but good muscle tone and bulk.  He does have a positive straight leg raise on the left side.  Labs  Lab Results  Component Value Date   HGBA1C 7.0 03/06/2024   HGBA1C 7.5 10/21/2022   HGBA1C 7.7 06/24/2022   Lab Results  Component Value Date   MICROALBUR 0.8 05/31/2024   LDLCALC 76 10/12/2024   CREATININE 1.50 (H) 10/12/2024    -------------------------------------------------------------------------------------------------------------------- Lab Results  Component Value Date   WBC 7.1 10/12/2024   HGB 14.2 10/12/2024   HCT 45.0 10/12/2024   PLT 231 10/12/2024   GLUCOSE 221 (H) 10/12/2024   CHOL 145 10/12/2024   TRIG 169 (H) 10/12/2024   HDL 40 10/12/2024   LDLDIRECT 78 10/12/2024   LDLCALC 76 10/12/2024   ALT 21 10/12/2024   AST 25 10/12/2024   NA 140 10/12/2024   K 4.4 10/12/2024   CL 98 10/12/2024   CREATININE 1.50 (H) 10/12/2024   BUN 26 10/12/2024   CO2 27 10/12/2024   TSH 2.23 08/24/2024   PSA 0.56 09/23/2021   INR 1.26 07/07/2018   HGBA1C 7.0 03/06/2024   MICROALBUR 0.8 05/31/2024    --------------------------------------------------------------------------------------------------------------------- DG PAIN CLINIC C-ARM 1-60 MIN NO REPORT Result Date: 12/22/2023 Fluoro was used, but no Radiologist interpretation will be provided. Please refer to NOTES tab for provider progress note.    Assessment & Plan:   [name] was seen today for back pain and shoulder pain.  Diagnoses and all orders for this visit:  Sciatica, left side -     Lumbar Epidural Injection; Future  Degeneration of intervertebral disc of lumbar region with discogenic back pain -     Lumbar Epidural Injection; Future  Low back pain at multiple sites -     Lumbar Epidural Injection; Future  Lumbar spondylosis with myelopathy  Chronic, continuous use of opioids  Chronic pain  syndrome  Left leg weakness  Cervicalgia  Other orders -     Oxycodone  HCl 10 MG TABS; Take 1 tablet (10 mg total) by mouth in the morning, at noon, in the evening, and at bedtime. -     Oxycodone  HCl 10 MG TABS; Take 1 tablet (10 mg total) by mouth in the morning, at noon, in the evening, and at bedtime. -     cyclobenzaprine  (FLEXERIL ) 10 MG tablet; Take 1 tablet (10 mg total) by mouth 2 (two) times daily.        ----------------------------------------------------------------------------------------------------------------------  Problem List Items Addressed This Visit   None Visit Diagnoses       Sciatica, left side    -  Primary   Relevant Medications   cyclobenzaprine  (FLEXERIL ) 10 MG tablet   Other Relevant Orders   Lumbar Epidural Injection     Degeneration of intervertebral disc of lumbar region with discogenic back pain       Relevant Medications   Oxycodone  HCl 10 MG TABS (Start on 10/20/2024)   Oxycodone  HCl 10 MG TABS (Start on 11/19/2024)   cyclobenzaprine  (FLEXERIL ) 10 MG tablet   Other Relevant Orders   Lumbar Epidural Injection     Low back pain at multiple sites       Relevant Medications   Oxycodone  HCl 10 MG TABS (Start on 10/20/2024)   Oxycodone  HCl 10 MG TABS (Start on 11/19/2024)   cyclobenzaprine  (FLEXERIL ) 10 MG tablet   Other Relevant Orders   Lumbar Epidural Injection     Lumbar spondylosis with myelopathy         Chronic, continuous use of opioids         Chronic pain syndrome       Relevant Medications   Oxycodone  HCl 10 MG TABS (Start on 10/20/2024)   Oxycodone  HCl 10 MG TABS (Start on 11/19/2024)   cyclobenzaprine  (FLEXERIL ) 10 MG  tablet     Left leg weakness         Cervicalgia             ----------------------------------------------------------------------------------------------------------------------  1. Sciatica, left side (Primary) Will plan on a repeat epidural injection in the next month.  I gone over the risks and benefits  with him in full detail he understands these well.  Continue core stretching strengthening. - Lumbar Epidural Injection; Future  2. Degeneration of intervertebral disc of lumbar region with discogenic back pain As above - Lumbar Epidural Injection; Future  3. Low back pain at multiple sites As above and continue core stretching strengthening with massage TENS and heat and ice therapy. - Lumbar Epidural Injection; Future  4. Lumbar spondylosis with myelopathy   5. Chronic, continuous use of opioids I have reviewed the   practitioner database information is appropriate for refill.  These to be dated for January 16 and February 15.  6. Chronic pain syndrome As above  7. Left leg weakness   8. Cervicalgia     ----------------------------------------------------------------------------------------------------------------------  I am having [name] start on Oxycodone  HCl. I am also having him maintain his gabapentin , metFORMIN , naloxone , rosuvastatin , aspirin  EC, gabapentin , Multiple Vitamins-Minerals (MENS MULTI VITAMIN & MINERAL PO), montelukast , albuterol , albuterol , nitroGLYCERIN , ammonium lactate , Semaglutide (2 MG/DOSE), spironolactone , Jardiance , clopidogrel , triamcinolone  cream, ketoconazole , DULoxetine , DULoxetine , levocetirizine, lubiprostone , ezetimibe , HumaLOG , QUEtiapine , hydrOXYzine , levothyroxine , predniSONE , azithromycin , Trelegy Ellipta , furosemide , Oxycodone  HCl, and cyclobenzaprine . We will continue to administer sodium chloride  flush.   Meds ordered this encounter  Medications   Oxycodone  HCl 10 MG TABS    Sig: Take 1 tablet (10 mg total) by mouth in the morning, at noon, in the evening, and at bedtime.    Dispense:  120 tablet    Refill:  0   Oxycodone  HCl 10 MG TABS    Sig: Take 1 tablet (10 mg total) by mouth in the morning, at noon, in the evening, and at bedtime.    Dispense:  120 tablet    Refill:  0   cyclobenzaprine   (FLEXERIL ) 10 MG tablet    Sig: Take 1 tablet (10 mg total) by mouth 2 (two) times daily.    Dispense:  60 tablet    Refill:  3   Patient's Medications  New Prescriptions   OXYCODONE  HCL 10 MG TABS    Take 1 tablet (10 mg total) by mouth in the morning, at noon, in the evening, and at bedtime.  Previous Medications   ALBUTEROL  (PROVENTIL ) (2.5 MG/3ML) 0.083% NEBULIZER SOLUTION    Take 3 mLs (2.5 mg total) by nebulization every 6 (six) hours as needed for wheezing or shortness of breath.   ALBUTEROL  (VENTOLIN  HFA) 108 (90 BASE) MCG/ACT INHALER    Inhale 2 puffs into the lungs daily.   AMMONIUM LACTATE  (AMLACTIN) 12 % LOTION    Apply 1 Application topically as needed for dry skin.   ASPIRIN  EC 81 MG TABLET    Take 1 tablet (81 mg total) by mouth daily.   AZITHROMYCIN  (ZITHROMAX ) 250 MG TABLET    Take 2 tablets on day 1, then 1 tablet daily on days 2 through 5   CLOPIDOGREL  (PLAVIX ) 75 MG TABLET    TAKE 1 TABLET BY MOUTH DAILY WITH BREAKFAST   DULOXETINE  (CYMBALTA ) 30 MG CAPSULE    Take 1 capsule (30 mg total) by mouth daily.   DULOXETINE  (CYMBALTA ) 60 MG CAPSULE    Take 1 capsule (60 mg total) by mouth daily.  EZETIMIBE  (ZETIA ) 10 MG TABLET    TAKE 1 TABLET(10 MG) BY MOUTH DAILY   FLUTICASONE -UMECLIDIN-VILANT (TRELEGY ELLIPTA ) 200-62.5-25 MCG/ACT AEPB    Inhale 1 puff into the lungs daily.   FUROSEMIDE  (LASIX ) 20 MG TABLET    Take 1 tablet (20 mg total) by mouth daily.   GABAPENTIN  (NEURONTIN ) 300 MG CAPSULE    Take 1 capsule (300 mg total) by mouth 3 (three) times daily.   GABAPENTIN  (NEURONTIN ) 800 MG TABLET    Take 800 mg by mouth at bedtime.   HUMALOG  100 UNIT/ML INJECTION    Inject 30-50 Units into the skin daily. Insulin  pump   HYDROXYZINE  (ATARAX ) 10 MG TABLET    Take 1 tablet (10 mg total) by mouth daily as needed.   JARDIANCE  10 MG TABS TABLET    TAKE 1 TABLET(10 MG) BY MOUTH DAILY BEFORE BREAKFAST   KETOCONAZOLE  (NIZORAL ) 2 % CREAM    Apply 1 Application topically daily. For rash on  face   LEVOCETIRIZINE (XYZAL ) 5 MG TABLET    Take 1 tablet (5 mg total) by mouth daily at 12 noon.   LEVOTHYROXINE  (SYNTHROID ) 50 MCG TABLET    TAKE 1 TABLET BY MOUTH EVERY DAY ON MONDAY TO SATURDAY AND 2 TABLETS ON SUNDAY   LUBIPROSTONE  (AMITIZA ) 8 MCG CAPSULE    TAKE 1 CAPSULE(8 MCG) BY MOUTH TWICE DAILY WITH A MEAL   METFORMIN  (GLUCOPHAGE ) 1000 MG TABLET    Take 1,000 mg by mouth 2 (two) times daily with a meal.   MONTELUKAST  (SINGULAIR ) 10 MG TABLET    Take 10 mg by mouth at bedtime.   MULTIPLE VITAMINS-MINERALS (MENS MULTI VITAMIN & MINERAL PO)    Take 1 tablet by mouth in the morning and at bedtime.   NALOXONE  (NARCAN ) NASAL SPRAY 4 MG/0.1 ML    For excess sedation from opioids   NITROGLYCERIN  (NITROSTAT ) 0.3 MG SL TABLET    DISSOLVE 1 TABLET UNDER THE TONGUE EVERY 5 MINUTES AS NEEDED FOR CHEST PAIN, MAX 3 DOSES   PREDNISONE  (DELTASONE ) 20 MG TABLET    Take 1 tablet (20 mg total) by mouth 2 (two) times daily with a meal.   QUETIAPINE  (SEROQUEL ) 25 MG TABLET    Take 1 tablet (25 mg total) by mouth at bedtime.   ROSUVASTATIN  (CRESTOR ) 5 MG TABLET    Take 5 mg by mouth daily with supper.   SEMAGLUTIDE, 2 MG/DOSE, 8 MG/3ML SOPN    Inject into the skin.   SPIRONOLACTONE  (ALDACTONE ) 25 MG TABLET    Take 0.5 tablets (12.5 mg total) by mouth daily.   TRIAMCINOLONE  CREAM (KENALOG ) 0.1 %    Apply 1 Application topically 2 (two) times daily as needed. For rash on arms/legs  Modified Medications   Modified Medication Previous Medication   CYCLOBENZAPRINE  (FLEXERIL ) 10 MG TABLET cyclobenzaprine  (FLEXERIL ) 10 MG tablet      Take 1 tablet (10 mg total) by mouth 2 (two) times daily.    Take 1 tablet (10 mg total) by mouth 2 (two) times daily.   OXYCODONE  HCL 10 MG TABS Oxycodone  HCl 10 MG TABS      Take 1 tablet (10 mg total) by mouth in the morning, at noon, in the evening, and at bedtime.    Take 1 tablet (10 mg total) by mouth in the morning, at noon, in the evening, and at bedtime.  Discontinued  Medications   No medications on file   ----------------------------------------------------------------------------------------------------------------------  Follow-up: Return in about 1 month (around 11/16/2024)  for evaluation, med refill, procedure.    Lynwood KANDICE Clause, MD  "

## 2024-10-16 NOTE — Patient Instructions (Addendum)
 " ______________________________________________________________________    Preparing for your procedure  Appointments: If you think you may not be able to keep your appointment, call 24-48 hours in advance to cancel. We need time to make it available to others.  Procedure visits are for procedures only. During your procedure appointment there will be: NO Prescription Refills*. NO medication changes or discussions*. NO discussion of disability issues*. NO unrelated pain problem evaluations*. NO evaluations to order other pain procedures*. *These will be addressed at a separate and distinct evaluation encounter on the provider's evaluation schedule and not during procedure days.  Instructions: Food intake: Avoid eating anything solid for at least 8 hours prior to your procedure. Clear liquid intake: You may take clear liquids such as water  up to 2 hours prior to your procedure. (No carbonated drinks. No soda.) Transportation: Unless otherwise stated by your physician, bring a driver. (Driver cannot be a Market Researcher, Pharmacist, Community, or any other form of public transportation.) Morning Medicines: Except for blood thinners, take all of your other morning medications with a sip of water . Make sure to take your heart and blood pressure medicines. If your blood pressure's lower number is above 100, the case will be rescheduled. Blood thinners: Make sure to stop your blood thinners as instructed.  If you take a blood thinner, but were not instructed to stop it, call our office [phone] and ask to talk to a nurse. Not stopping a blood thinner prior to certain procedures could lead to serious complications. Diabetics on insulin : Notify the staff so that you can be scheduled 1st case in the morning. If your diabetes requires high dose insulin , take only  of your normal insulin  dose the morning of the procedure and notify the staff that you have done so. Preventing infections: Shower with an antibacterial soap the  morning of your procedure.  Build-up your immune system: Take 1000 mg of Vitamin C with every meal (3 times a day) the day prior to your procedure. Antibiotics: Inform the nursing staff if you are taking any antibiotics or if you have any conditions that may require antibiotics prior to procedures. (Example: recent joint implants)   Pregnancy: If you are pregnant make sure to notify the nursing staff. Not doing so may result in injury to the fetus, including death.  Sickness: If you have a cold, fever, or any active infections, call and cancel or reschedule your procedure. Receiving steroids while having an infection may result in complications. Arrival: You must be in the facility at least 30 minutes prior to your scheduled procedure. Tardiness: Your scheduled time is also the cutoff time. If you do not arrive at least 15 minutes prior to your procedure, you will be rescheduled.  Children: Do not bring any children with you. Make arrangements to keep them home. Dress appropriately: There is always a possibility that your clothing may get soiled. Avoid long dresses. Valuables: Do not bring any jewelry or valuables.  Reasons to call and reschedule or cancel your procedure: (Following these recommendations will minimize the risk of a serious complication.) Surgeries: Avoid having procedures within 2 weeks of any surgery. (Avoid for 2 weeks before or after any surgery). Flu Shots: Avoid having procedures within 2 weeks of a flu shots or . (Avoid for 2 weeks before or after immunizations). Barium: Avoid having a procedure within 7-10 days after having had a radiological study involving the use of radiological contrast. (Myelograms, Barium swallow or enema study). Heart attacks: Avoid any elective procedures or surgeries for  the initial 6 months after a Myocardial Infarction (Heart Attack). Blood thinners: It is imperative that you stop these medications before procedures. Let us  know if you if you take  any blood thinner.  Infection: Avoid procedures during or within two weeks of an infection (including chest colds or gastrointestinal problems). Symptoms associated with infections include: Localized redness, fever, chills, night sweats or profuse sweating, burning sensation when voiding, cough, congestion, stuffiness, runny nose, sore throat, diarrhea, nausea, vomiting, cold or Flu symptoms, recent or current infections. It is specially important if the infection is over the area that we intend to treat. Heart and lung problems: Symptoms that may suggest an active cardiopulmonary problem include: cough, chest pain, breathing difficulties or shortness of breath, dizziness, ankle swelling, uncontrolled high or unusually low blood pressure, and/or palpitations. If you are experiencing any of these symptoms, cancel your procedure and contact your primary care physician for an evaluation.  Remember:  Regular Business hours are:  Monday to Thursday 8:00 AM to 4:00 PM  Provider's Schedule: Eric Como, MD:  Procedure days: Tuesday and Thursday 7:30 AM to 4:00 PM  Wallie Sherry, MD:  Procedure days: Monday and Wednesday 7:30 AM to 4:00 PM Last  Updated: 09/14/2023 ______________________________________________________________________   Stop Plavix  7 days before epidural steroid injection. "

## 2024-10-16 NOTE — Progress Notes (Signed)
 Nursing Pain Medication Assessment:  Safety precautions to be maintained throughout the outpatient stay will include: orient to surroundings, keep bed in low position, maintain call bell within reach at all times, provide assistance with transfer out of bed and ambulation.  Medication Inspection Compliance: Pill count conducted under aseptic conditions, in front of the patient. Neither the pills nor the bottle was removed from the patient's sight at any time. Once count was completed pills were immediately returned to the patient in their original bottle.  Medication: Oxycodone  IR Pill/Patch Count: 0 of 120 pills/patches remain Pill/Patch Appearance: Markings consistent with prescribed medication Bottle Appearance: Standard pharmacy container. Clearly labeled. Filled Date: 41 / 17 / 2026 Last Medication intake:  ran out this morning,  thinks that someone is getting into his medication.  Has not figured out who it is, keeping them locked up now. Son has been picking them up, he is going to start going himself

## 2024-10-17 ENCOUNTER — Ambulatory Visit: Admitting: Licensed Clinical Social Worker

## 2024-10-17 DIAGNOSIS — Z634 Disappearance and death of family member: Secondary | ICD-10-CM

## 2024-10-17 DIAGNOSIS — F330 Major depressive disorder, recurrent, mild: Secondary | ICD-10-CM

## 2024-10-17 NOTE — Progress Notes (Signed)
 "  THERAPIST PROGRESS NOTE  Session Time: 11:02-11:52am  Participation Level: Active  Behavioral Response: CasualAlertAnxious  Type of Therapy: Individual Therapy  Treatment Goals addressed:  Active     OP Depression     LTG: Reduce frequency, intensity, and duration of depression symptoms so that daily functioning is improved (Progressing)     Start:  07/27/24    Expected End:  10/27/24         LTG: Increase coping skills to manage depression and improve ability to perform daily activities (Progressing)     Start:  07/27/24    Expected End:  10/27/24         STG: [name] will identify cognitive patterns and beliefs that support depression (Progressing)     Start:  07/27/24    Expected End:  10/27/24         Demonstrates progress in stages of grief at own pace (Progressing)     Start:  07/27/24    Expected End:  10/27/24            Work with [name] to identify the major components of a recent episode of depression: physical symptoms, major thoughts and images, and major behaviors they experienced     Start:  07/27/24         [name] will identify 2 cognitive distortions they are currently using and write reframing statements to replace them     Start:  07/27/24         Coping Skills     Start:  07/27/24       Will work with the pt using CBT/DBT techniques to help the pt verbalize an understanding of the cognitive, physiological, and behavioral components of depression and its treatment. This will be done by using worksheets, interactive activities, CBT/ABC thought logs, modeling, homework, role playing and journaling. Will work with pt to learn and implement coping skills that result in a reduction of depression and improve daily functioning per pt self-report 3 out of 5 documented sessions.       Educate patient on: Stages of grief     Start:  07/27/24               ProgressTowards Goals: Progressing  Interventions: CBT, Solution  Focused, Supportive, and Reframing  Summary: [name] is a 64 y.o. male who presents with symptoms of anxiety. Patient reports symptoms to include difficulty falling and staying asleep, avoidance, restlessness, tearfulness, low mood. Pt was oriented times 5. Pt was cooperative and engaged. Pt denies SI/HI/AVH.     The clinician readministered the PHQ-9 and GAD-7 assessments. The patient's scores decreased from 12 to 4 on the PHQ-9, and from 12 to 6 on the GAD-7. The patient believes that his scores may be skewed due to recent steroid treatment. He reports feeling both depressed and anxious, as indicated by sensations such as butterflies in his stomach and tension. The patient acknowledges that his anxiety often feels random and situational.  He also mentioned stressors in his home environment, including ongoing construction, which has led to noise pollution and contributed to a sense of chaos. The patient identified medication and spending more time outdoors as positive factors for his overall mental health. He reflected on the construction of a no send letter at the beginning of January and his feelings of guilt related to that process. Additionally, he noted that he continues to feel lonely in the evenings but is working on establishing new routines.  The patient  expressed aspirations to improve his social life and a desire to meet new people. The clinician collaborated with him to challenge negative beliefs regarding barriers to transportation and fears of judgment from others. We explored the patient's expectations regarding the process of meeting new people and discussed strategies for managing negative thoughts about potential outcomes. The patient identified a goal to reach out to specific Facebook groups in order to better understand networking opportunities in the [place] area.  Therapist Response: Cln utilized active and supportive reflection to create safe environment for patient to  process recent life stressors. Clinician assessed for current symptoms, stressors, safety since last session.   Addition worked with patient through CBT to reframe negative cognitions about his ability to make new social connections.  Worked with patient to identify solutions and action steps he can begin to take to improve social opportunities.  Plan: Return again in 3 weeks.   Diagnosis: MDD (major depressive disorder), recurrent episode, moderate (HCC)   Recent bereavement     Collaboration of Care: Other Meet with LCSW per availability.  Patient/Guardian was advised Release of Information must be obtained prior to any record release in order to collaborate their care with an outside provider. Patient/Guardian was advised if they have not already done so to contact the registration department to sign all necessary forms in order for us  to release information regarding their care.   Consent: Patient/Guardian gives verbal consent for treatment and assignment of benefits for services provided during this visit. Patient/Guardian expressed understanding and agreed to proceed.   Edgar KATHEE Husband, LCSW 10/17/2024  "

## 2024-11-01 NOTE — Progress Notes (Unsigned)
 "  THERAPIST PROGRESS NOTE  Session Time: ***  Participation Level: Active  Behavioral Response: CasualAlertAnxious  Type of Therapy: Individual Therapy  Treatment Goals addressed:  Active     OP Depression     LTG: Reduce frequency, intensity, and duration of depression symptoms so that daily functioning is improved (Progressing)     Start:  07/27/24    Expected End:  10/27/24         LTG: Increase coping skills to manage depression and improve ability to perform daily activities (Progressing)     Start:  07/27/24    Expected End:  10/27/24       Goal Note     October 17, 2024: Patient reports he has been able to manage symptoms by exploring opportunities to improve socialization, going on frequent walks, and taking his medications as needed to manage symptoms.         STG: [name] will identify cognitive patterns and beliefs that support depression (Progressing)     Start:  07/27/24    Expected End:  10/27/24         Demonstrates progress in stages of grief at own pace (Progressing)     Start:  07/27/24    Expected End:  10/27/24          Goal Note     October 17, 2024: Patient continues to demonstrate improvement with grief by acknowledging progress from previous stages as well as newfound hopes of meeting individuals who may be able to foster new connections as well as relate with his current stage in life.         Work with [name] to identify the major components of a recent episode of depression: physical symptoms, major thoughts and images, and major behaviors they experienced     Start:  07/27/24         Tirso Laws will identify 2 cognitive distortions they are currently using and write reframing statements to replace them     Start:  07/27/24         Coping Skills     Start:  07/27/24       Will work with the pt using CBT/DBT techniques to help the pt verbalize an understanding of the cognitive, physiological, and behavioral  components of depression and its treatment. This will be done by using worksheets, interactive activities, CBT/ABC thought logs, modeling, homework, role playing and journaling. Will work with pt to learn and implement coping skills that result in a reduction of depression and improve daily functioning per pt self-report 3 out of 5 documented sessions.       Educate patient on: Stages of grief     Start:  07/27/24               ProgressTowards Goals: Progressing   Interventions: CBT, Solution Focused, Supportive, and Reframing   Summary: [name] is a 64 y.o. male who presents with symptoms of anxiety. Patient reports symptoms to include difficulty falling and staying asleep, avoidance, restlessness, tearfulness, low mood. Pt was oriented times 5. Pt was cooperative and engaged. Pt denies SI/HI/AVH.         Therapist Response: Cln utilized active and supportive reflection to create safe environment for patient to process recent life stressors. Clinician assessed for current symptoms, stressors, safety since last session.      Plan: Return again in 3 weeks.   Diagnosis: MDD (major depressive disorder), recurrent episode, moderate (HCC)   Recent bereavement  Collaboration of Care: Other Meet with LCSW per availability.  Patient/Guardian was advised Release of Information must be obtained prior to any record release in order to collaborate their care with an outside provider. Patient/Guardian was advised if they have not already done so to contact the registration department to sign all necessary forms in order for us  to release information regarding their care.   Consent: Patient/Guardian gives verbal consent for treatment and assignment of benefits for services provided during this visit. Patient/Guardian expressed understanding and agreed to proceed.   Evalene KATHEE Husband, LCSW 11/01/2024  "

## 2024-11-02 ENCOUNTER — Ambulatory Visit: Admitting: Licensed Clinical Social Worker

## 2024-11-06 ENCOUNTER — Ambulatory Visit: Admitting: Anesthesiology

## 2024-11-07 ENCOUNTER — Other Ambulatory Visit: Payer: Self-pay | Admitting: Anesthesiology

## 2024-11-07 DIAGNOSIS — G894 Chronic pain syndrome: Secondary | ICD-10-CM

## 2024-11-08 ENCOUNTER — Encounter: Payer: Self-pay | Admitting: Anesthesiology

## 2024-11-08 ENCOUNTER — Ambulatory Visit
Admission: RE | Admit: 2024-11-08 | Discharge: 2024-11-08 | Disposition: A | Source: Ambulatory Visit | Attending: Anesthesiology | Admitting: Anesthesiology

## 2024-11-08 ENCOUNTER — Ambulatory Visit: Payer: Self-pay | Admitting: Anesthesiology

## 2024-11-08 ENCOUNTER — Other Ambulatory Visit: Payer: Self-pay | Admitting: Nurse Practitioner

## 2024-11-08 VITALS — BP 140/95 | HR 92 | Temp 97.7°F | Resp 14 | Ht 67.0 in | Wt 204.0 lb

## 2024-11-08 DIAGNOSIS — G894 Chronic pain syndrome: Secondary | ICD-10-CM

## 2024-11-08 DIAGNOSIS — M5136 Other intervertebral disc degeneration, lumbar region with discogenic back pain only: Secondary | ICD-10-CM

## 2024-11-08 DIAGNOSIS — M5432 Sciatica, left side: Secondary | ICD-10-CM | POA: Diagnosis not present

## 2024-11-08 DIAGNOSIS — M545 Low back pain, unspecified: Secondary | ICD-10-CM

## 2024-11-08 DIAGNOSIS — G7000 Myasthenia gravis without (acute) exacerbation: Secondary | ICD-10-CM

## 2024-11-08 DIAGNOSIS — R29898 Other symptoms and signs involving the musculoskeletal system: Secondary | ICD-10-CM

## 2024-11-08 DIAGNOSIS — M47817 Spondylosis without myelopathy or radiculopathy, lumbosacral region: Secondary | ICD-10-CM

## 2024-11-08 DIAGNOSIS — F1190 Opioid use, unspecified, uncomplicated: Secondary | ICD-10-CM

## 2024-11-08 DIAGNOSIS — I5020 Unspecified systolic (congestive) heart failure: Secondary | ICD-10-CM

## 2024-11-08 DIAGNOSIS — M542 Cervicalgia: Secondary | ICD-10-CM

## 2024-11-08 DIAGNOSIS — M4716 Other spondylosis with myelopathy, lumbar region: Secondary | ICD-10-CM

## 2024-11-08 MED ORDER — OXYCODONE HCL 10 MG PO TABS: 10.0000 mg | ORAL_TABLET | Freq: Four times a day (QID) | ORAL | 0 refills | Status: AC

## 2024-11-08 MED ORDER — IOHEXOL 180 MG/ML  SOLN
INTRAMUSCULAR | Status: AC
  Filled 2024-11-08: qty 20

## 2024-11-08 MED ORDER — ROPIVACAINE HCL 2 MG/ML IJ SOLN
INTRAMUSCULAR | Status: AC
  Filled 2024-11-08: qty 20

## 2024-11-08 MED ORDER — SODIUM CHLORIDE (PF) 0.9 % IJ SOLN
INTRAMUSCULAR | Status: AC
  Filled 2024-11-08: qty 10

## 2024-11-08 MED ORDER — TRIAMCINOLONE ACETONIDE 40 MG/ML IJ SUSP
INTRAMUSCULAR | Status: AC
  Filled 2024-11-08: qty 1

## 2024-11-08 MED ORDER — LIDOCAINE HCL (PF) 1 % IJ SOLN
INTRAMUSCULAR | Status: AC
  Filled 2024-11-08: qty 5

## 2024-11-08 NOTE — Progress Notes (Signed)
 "  Subjective:  Patient ID: [name], male    DOB: 03-05-1961  Age: 64 y.o. MRN: [id number]  CC: Back Pain   Procedure: L5-S1 lumbar epidural steroid under fluoroscopic guidance at no sedation  HPI [name] presents for reevaluation.  Velinda was last reviewed 2 months ago and he is having a lot more left lower extremity sciatica symptoms.  His last epidural was back in March 2025.  He desires to proceed with a repeat epidural today secondary to the recalcitrant nature of the sciatica which is keeping him up at night and limiting his activity.  He has responded favorably to epidurals in the past generally getting about 80% relief lasting about 3 to 4 months however this last epidural lasted almost a year.  Otherwise no change in lower EXTR and strength function bowel or bladder function is noted.  He takes his medications as prescribed and is having no side effects with this.  Outpatient Medications Prior to Visit  Medication Sig Dispense Refill   albuterol  (PROVENTIL ) (2.5 MG/3ML) 0.083% nebulizer solution Take 3 mLs (2.5 mg total) by nebulization every 6 (six) hours as needed for wheezing or shortness of breath. 75 mL 2   albuterol  (VENTOLIN  HFA) 108 (90 Base) MCG/ACT inhaler Inhale 2 puffs into the lungs daily.     ammonium lactate  (AMLACTIN) 12 % lotion Apply 1 Application topically as needed for dry skin. 400 g 3   aspirin  EC 81 MG tablet Take 1 tablet (81 mg total) by mouth daily. 90 tablet 3   clopidogrel  (PLAVIX ) 75 MG tablet TAKE 1 TABLET BY MOUTH DAILY WITH BREAKFAST 90 tablet 3   cyclobenzaprine  (FLEXERIL ) 10 MG tablet TAKE 1 TABLET(10 MG) BY MOUTH TWICE DAILY 60 tablet 3   cyclobenzaprine  (FLEXERIL ) 10 MG tablet Take 1 tablet (10 mg total) by mouth 2 (two) times daily. 60 tablet 3   DULoxetine  (CYMBALTA ) 30 MG capsule Take 1 capsule (30 mg total) by mouth daily. 90 capsule 1   DULoxetine  (CYMBALTA ) 60 MG capsule Take 1 capsule (60 mg total) by mouth daily. 90 capsule 1    ezetimibe  (ZETIA ) 10 MG tablet TAKE 1 TABLET(10 MG) BY MOUTH DAILY 90 tablet 2   Fluticasone -Umeclidin-Vilant (TRELEGY ELLIPTA ) 200-62.5-25 MCG/ACT AEPB Inhale 1 puff into the lungs daily.     furosemide  (LASIX ) 20 MG tablet Take 1 tablet (20 mg total) by mouth daily. 90 tablet 3   gabapentin  (NEURONTIN ) 300 MG capsule Take 1 capsule (300 mg total) by mouth 3 (three) times daily. 90 capsule 1   gabapentin  (NEURONTIN ) 800 MG tablet Take 800 mg by mouth at bedtime.     HUMALOG  100 UNIT/ML injection Inject 30-50 Units into the skin daily. Insulin  pump     hydrOXYzine  (ATARAX ) 10 MG tablet Take 1 tablet (10 mg total) by mouth daily as needed. 90 tablet 0   JARDIANCE  10 MG TABS tablet TAKE 1 TABLET(10 MG) BY MOUTH DAILY BEFORE BREAKFAST 90 tablet 3   ketoconazole  (NIZORAL ) 2 % cream Apply 1 Application topically daily. For rash on face 60 g 0   levocetirizine (XYZAL ) 5 MG tablet Take 1 tablet (5 mg total) by mouth daily at 12 noon. 90 tablet 1   levothyroxine  (SYNTHROID ) 50 MCG tablet TAKE 1 TABLET BY MOUTH EVERY DAY ON MONDAY TO SATURDAY AND 2 TABLETS ON SUNDAY 102 tablet 0   lubiprostone  (AMITIZA ) 8 MCG capsule TAKE 1 CAPSULE(8 MCG) BY MOUTH TWICE DAILY WITH A MEAL 180 capsule 1  metFORMIN  (GLUCOPHAGE ) 1000 MG tablet Take 1,000 mg by mouth 2 (two) times daily with a meal.     montelukast  (SINGULAIR ) 10 MG tablet Take 10 mg by mouth at bedtime.     Multiple Vitamins-Minerals (MENS MULTI VITAMIN & MINERAL PO) Take 1 tablet by mouth in the morning and at bedtime.     naloxone  (NARCAN ) nasal spray 4 mg/0.1 mL For excess sedation from opioids 1 kit 2   nitroGLYCERIN  (NITROSTAT ) 0.3 MG SL tablet DISSOLVE 1 TABLET UNDER THE TONGUE EVERY 5 MINUTES AS NEEDED FOR CHEST PAIN, MAX 3 DOSES 25 tablet 0   [START ON 11/19/2024] Oxycodone  HCl 10 MG TABS Take 1 tablet (10 mg total) by mouth in the morning, at noon, in the evening, and at bedtime. 120 tablet 0   QUEtiapine  (SEROQUEL ) 25 MG tablet Take 1 tablet (25 mg  total) by mouth at bedtime. 90 tablet 0   rosuvastatin  (CRESTOR ) 5 MG tablet Take 5 mg by mouth daily with supper.  0   Semaglutide, 2 MG/DOSE, 8 MG/3ML SOPN Inject into the skin.     spironolactone  (ALDACTONE ) 25 MG tablet Take 0.5 tablets (12.5 mg total) by mouth daily. 45 tablet 3   triamcinolone  cream (KENALOG ) 0.1 % Apply 1 Application topically 2 (two) times daily as needed. For rash on arms/legs 453.6 g 0   Oxycodone  HCl 10 MG TABS Take 1 tablet (10 mg total) by mouth in the morning, at noon, in the evening, and at bedtime. 120 tablet 0   predniSONE  (DELTASONE ) 20 MG tablet Take 1 tablet (20 mg total) by mouth 2 (two) times daily with a meal. (Patient not taking: Reported on 11/08/2024) 10 tablet 0   Facility-Administered Medications Prior to Visit  Medication Dose Route Frequency Provider Last Rate Last Admin   sodium chloride  flush (NS) 0.9 % injection 3 mL  3 mL Intravenous Q12H Furth, Cadence H, PA-C        Review of Systems CNS: No confusion or sedation Cardiac: No angina or palpitations GI: No abdominal pain or constipation Constitutional: No nausea vomiting fevers or chills  Objective:  BP (!) 140/95   Pulse 92   Temp 97.7 F (36.5 C) (Temporal)   Resp 14   Ht 5' 7 (1.702 m)   Wt 204 lb (92.5 kg)   SpO2 95%   BMI 31.95 kg/m    BP Readings from Last 3 Encounters:  11/08/24 (!) 140/95  10/16/24 137/87  10/12/24 92/62     Wt Readings from Last 3 Encounters:  11/08/24 204 lb (92.5 kg)  10/16/24 213 lb (96.6 kg)  10/12/24 214 lb 8 oz (97.3 kg)     Physical Exam Pt is alert and oriented PERRL EOMI HEART IS RRR no murmur or rub LCTA no wheezing or rales MUSCULOSKELETAL reveals some paraspinous muscle tenderness in the lumbar region but no overt trigger points.  He has a antalgic gait with a positive straight leg raise left side somewhat equivocal on the right.  Muscle tone and bulk is at baseline.  Labs  Lab Results  Component Value Date   HGBA1C 7.0  03/06/2024   HGBA1C 7.5 10/21/2022   HGBA1C 7.7 06/24/2022   Lab Results  Component Value Date   MICROALBUR 0.8 05/31/2024   LDLCALC 76 10/12/2024   CREATININE 1.50 (H) 10/12/2024    -------------------------------------------------------------------------------------------------------------------- Lab Results  Component Value Date   WBC 7.1 10/12/2024   HGB 14.2 10/12/2024   HCT 45.0 10/12/2024   PLT 231 10/12/2024  GLUCOSE 221 (H) 10/12/2024   CHOL 145 10/12/2024   TRIG 169 (H) 10/12/2024   HDL 40 10/12/2024   LDLDIRECT 78 10/12/2024   LDLCALC 76 10/12/2024   ALT 21 10/12/2024   AST 25 10/12/2024   NA 140 10/12/2024   K 4.4 10/12/2024   CL 98 10/12/2024   CREATININE 1.50 (H) 10/12/2024   BUN 26 10/12/2024   CO2 27 10/12/2024   TSH 2.23 08/24/2024   PSA 0.56 09/23/2021   INR 1.26 07/07/2018   HGBA1C 7.0 03/06/2024   MICROALBUR 0.8 05/31/2024    --------------------------------------------------------------------------------------------------------------------- DG PAIN CLINIC C-ARM 1-60 MIN NO REPORT Result Date: 11/08/2024 Fluoro was used, but no Radiologist interpretation will be provided. Please refer to NOTES tab for provider progress note.    Assessment & Plan:   There are no diagnoses linked to this encounter.    1. Sciatica, left side   2. Degeneration of intervertebral disc of lumbar region with discogenic back pain   3. Low back pain at multiple sites   4. Chronic pain syndrome   5. Chronic, continuous use of opioids   6. Lumbar spondylosis with myelopathy   7. Left leg weakness   8. Cervicalgia   9. Facet arthritis of lumbosacral region   10. Myasthenia gravis (HCC)    Based on our conversation today I think it is appropriate to proceed with a repeat epidural.  Risks and benefits of the procedure been reviewed with him in full detail.  All questions answered.  Will schedule a follow-up in 1 month for evaluation of response to therapy.  I am  going to refill his medications for the next 2 months.  Will keep you on the oxycodone  10 mg strength and I have encouraged him to continue with stretching strengthening exercises as he is now doing.  Continue with daily walking as best he can and continue follow-up with his primary care physician for baseline medical care.  ----------------------------------------------------------------------------------------------------------------------  Problem List Items Addressed This Visit   None     ----------------------------------------------------------------------------------------------------------------------  There are no diagnoses linked to this encounter.   ----------------------------------------------------------------------------------------------------------------------  I am having Evalene HERO. Starr Rider maintain his gabapentin , metFORMIN , naloxone , rosuvastatin , aspirin  EC, gabapentin , Multiple Vitamins-Minerals (MENS MULTI VITAMIN & MINERAL PO), montelukast , albuterol , albuterol , nitroGLYCERIN , ammonium lactate , Semaglutide (2 MG/DOSE), spironolactone , Jardiance , clopidogrel , triamcinolone  cream, ketoconazole , DULoxetine , DULoxetine , levocetirizine, lubiprostone , ezetimibe , HumaLOG , QUEtiapine , hydrOXYzine , levothyroxine , predniSONE , Trelegy Ellipta , furosemide , cyclobenzaprine , Oxycodone  HCl, cyclobenzaprine , and Oxycodone  HCl. We will continue to administer sodium chloride  flush.   Meds ordered this encounter  Medications   Oxycodone  HCl 10 MG TABS    Sig: Take 1 tablet (10 mg total) by mouth in the morning, at noon, in the evening, and at bedtime.    Dispense:  120 tablet    Refill:  0   Patient's Medications  New Prescriptions   No medications on file  Previous Medications   ALBUTEROL  (PROVENTIL ) (2.5 MG/3ML) 0.083% NEBULIZER SOLUTION    Take 3 mLs (2.5 mg total) by nebulization every 6 (six) hours as needed for wheezing or shortness of breath.   ALBUTEROL  (VENTOLIN   HFA) 108 (90 BASE) MCG/ACT INHALER    Inhale 2 puffs into the lungs daily.   AMMONIUM LACTATE  (AMLACTIN) 12 % LOTION    Apply 1 Application topically as needed for dry skin.   ASPIRIN  EC 81 MG TABLET    Take 1 tablet (81 mg total) by mouth daily.   CLOPIDOGREL  (PLAVIX ) 75 MG TABLET    TAKE  1 TABLET BY MOUTH DAILY WITH BREAKFAST   CYCLOBENZAPRINE  (FLEXERIL ) 10 MG TABLET    TAKE 1 TABLET(10 MG) BY MOUTH TWICE DAILY   CYCLOBENZAPRINE  (FLEXERIL ) 10 MG TABLET    Take 1 tablet (10 mg total) by mouth 2 (two) times daily.   DULOXETINE  (CYMBALTA ) 30 MG CAPSULE    Take 1 capsule (30 mg total) by mouth daily.   DULOXETINE  (CYMBALTA ) 60 MG CAPSULE    Take 1 capsule (60 mg total) by mouth daily.   EZETIMIBE  (ZETIA ) 10 MG TABLET    TAKE 1 TABLET(10 MG) BY MOUTH DAILY   FLUTICASONE -UMECLIDIN-VILANT (TRELEGY ELLIPTA ) 200-62.5-25 MCG/ACT AEPB    Inhale 1 puff into the lungs daily.   FUROSEMIDE  (LASIX ) 20 MG TABLET    Take 1 tablet (20 mg total) by mouth daily.   GABAPENTIN  (NEURONTIN ) 300 MG CAPSULE    Take 1 capsule (300 mg total) by mouth 3 (three) times daily.   GABAPENTIN  (NEURONTIN ) 800 MG TABLET    Take 800 mg by mouth at bedtime.   HUMALOG  100 UNIT/ML INJECTION    Inject 30-50 Units into the skin daily. Insulin  pump   HYDROXYZINE  (ATARAX ) 10 MG TABLET    Take 1 tablet (10 mg total) by mouth daily as needed.   JARDIANCE  10 MG TABS TABLET    TAKE 1 TABLET(10 MG) BY MOUTH DAILY BEFORE BREAKFAST   KETOCONAZOLE  (NIZORAL ) 2 % CREAM    Apply 1 Application topically daily. For rash on face   LEVOCETIRIZINE (XYZAL ) 5 MG TABLET    Take 1 tablet (5 mg total) by mouth daily at 12 noon.   LEVOTHYROXINE  (SYNTHROID ) 50 MCG TABLET    TAKE 1 TABLET BY MOUTH EVERY DAY ON MONDAY TO SATURDAY AND 2 TABLETS ON SUNDAY   LUBIPROSTONE  (AMITIZA ) 8 MCG CAPSULE    TAKE 1 CAPSULE(8 MCG) BY MOUTH TWICE DAILY WITH A MEAL   METFORMIN  (GLUCOPHAGE ) 1000 MG TABLET    Take 1,000 mg by mouth 2 (two) times daily with a meal.   MONTELUKAST   (SINGULAIR ) 10 MG TABLET    Take 10 mg by mouth at bedtime.   MULTIPLE VITAMINS-MINERALS (MENS MULTI VITAMIN & MINERAL PO)    Take 1 tablet by mouth in the morning and at bedtime.   NALOXONE  (NARCAN ) NASAL SPRAY 4 MG/0.1 ML    For excess sedation from opioids   NITROGLYCERIN  (NITROSTAT ) 0.3 MG SL TABLET    DISSOLVE 1 TABLET UNDER THE TONGUE EVERY 5 MINUTES AS NEEDED FOR CHEST PAIN, MAX 3 DOSES   OXYCODONE  HCL 10 MG TABS    Take 1 tablet (10 mg total) by mouth in the morning, at noon, in the evening, and at bedtime.   PREDNISONE  (DELTASONE ) 20 MG TABLET    Take 1 tablet (20 mg total) by mouth 2 (two) times daily with a meal.   QUETIAPINE  (SEROQUEL ) 25 MG TABLET    Take 1 tablet (25 mg total) by mouth at bedtime.   ROSUVASTATIN  (CRESTOR ) 5 MG TABLET    Take 5 mg by mouth daily with supper.   SEMAGLUTIDE, 2 MG/DOSE, 8 MG/3ML SOPN    Inject into the skin.   SPIRONOLACTONE  (ALDACTONE ) 25 MG TABLET    Take 0.5 tablets (12.5 mg total) by mouth daily.   TRIAMCINOLONE  CREAM (KENALOG ) 0.1 %    Apply 1 Application topically 2 (two) times daily as needed. For rash on arms/legs  Modified Medications   Modified Medication Previous Medication   OXYCODONE  HCL 10 MG TABS Oxycodone   HCl 10 MG TABS      Take 1 tablet (10 mg total) by mouth in the morning, at noon, in the evening, and at bedtime.    Take 1 tablet (10 mg total) by mouth in the morning, at noon, in the evening, and at bedtime.  Discontinued Medications   No medications on file   ----------------------------------------------------------------------------------------------------------------------  Follow-up: Return in about 1 month (around 12/06/2024) for evaluation, med refill.   Procedure: L5-S1 LESI with fluoroscopic guidance and no moderate sedation  NOTE: The risks, benefits, and expectations of the procedure have been discussed and explained to the patient who was understanding and in agreement with suggested treatment plan. No guarantees were  made.  DESCRIPTION OF PROCEDURE: Lumbar epidural steroid injection with no IV Versed , EKG, blood pressure, pulse, and pulse oximetry monitoring. The procedure was performed with the patient in the prone position under fluoroscopic guidance.  Sterile prep x3 was initiated and I then injected subcutaneous lidocaine  to the overlying L5-S1 site after its fluoroscopic identifictation.  Using strict aseptic technique, I then advanced an 18-gauge Tuohy epidural needle in the midline using interlaminar approach via loss-of-resistance to saline technique. There was negative aspiration for heme or  CSF.  I then confirmed position with both AP and Lateral fluoroscan.  2 cc of contrast dye were injected and a  total of 5 mL of Preservative-Free normal saline mixed with 40 mg of Kenalog  and 1cc Ropicaine 0.2 percent were injected incrementally via the  epidurally placed needle. The needle was removed. The patient tolerated the injection well and was convalesced and discharged to home in stable condition. Should the patient have any post procedure difficulty they have been instructed on how to contact us  for assistance.   Lynwood KANDICE Clause, MD  "

## 2024-11-08 NOTE — Progress Notes (Signed)
 Nursing Pain Medication Assessment:  Safety precautions to be maintained throughout the outpatient stay will include: orient to surroundings, keep bed in low position, maintain call bell within reach at all times, provide assistance with transfer out of bed and ambulation.  Medication Inspection Compliance: Pill count conducted under aseptic conditions, in front of the patient. Neither the pills nor the bottle was removed from the patient's sight at any time. Once count was completed pills were immediately returned to the patient in their original bottle.  Medication: Oxycodone  IR Pill/Patch Count: 49 of 120 pills/patches remain Pill/Patch Appearance: Markings consistent with prescribed medication Bottle Appearance: Standard pharmacy container. Clearly labeled. Filled Date: 01 / 12 / 2026 Last Medication intake:  Today

## 2024-11-08 NOTE — Patient Instructions (Signed)

## 2024-11-10 MED ORDER — FUROSEMIDE 20 MG PO TABS: 20.0000 mg | ORAL_TABLET | Freq: Every day | ORAL | 3 refills | Status: AC

## 2024-11-23 ENCOUNTER — Ambulatory Visit: Admitting: Licensed Clinical Social Worker

## 2024-12-01 ENCOUNTER — Ambulatory Visit: Admitting: Family Medicine

## 2024-12-12 ENCOUNTER — Ambulatory Visit: Admitting: Licensed Clinical Social Worker

## 2025-01-11 ENCOUNTER — Ambulatory Visit: Admitting: Nurse Practitioner

## 2025-08-10 ENCOUNTER — Ambulatory Visit
